# Patient Record
Sex: Female | Born: 1950 | Race: Black or African American | Hispanic: No | Marital: Married | Smoking: Never smoker
Health system: Southern US, Community
[De-identification: ages and names within clinical notes are randomized; demographics above are authoritative.]

## PROBLEM LIST (undated history)

## (undated) DIAGNOSIS — I5030 Unspecified diastolic (congestive) heart failure: Secondary | ICD-10-CM

## (undated) DIAGNOSIS — I509 Heart failure, unspecified: Secondary | ICD-10-CM

## (undated) DIAGNOSIS — Z951 Presence of aortocoronary bypass graft: Secondary | ICD-10-CM

## (undated) DIAGNOSIS — R7881 Bacteremia: Secondary | ICD-10-CM

## (undated) DIAGNOSIS — N186 End stage renal disease: Secondary | ICD-10-CM

## (undated) DIAGNOSIS — J329 Chronic sinusitis, unspecified: Secondary | ICD-10-CM

## (undated) DIAGNOSIS — I779 Disorder of arteries and arterioles, unspecified: Secondary | ICD-10-CM

## (undated) DIAGNOSIS — T4145XA Adverse effect of unspecified anesthetic, initial encounter: Secondary | ICD-10-CM

## (undated) DIAGNOSIS — R630 Anorexia: Secondary | ICD-10-CM

## (undated) DIAGNOSIS — I2510 Atherosclerotic heart disease of native coronary artery without angina pectoris: Secondary | ICD-10-CM

## (undated) DIAGNOSIS — R011 Cardiac murmur, unspecified: Secondary | ICD-10-CM

## (undated) DIAGNOSIS — I10 Essential (primary) hypertension: Secondary | ICD-10-CM

## (undated) DIAGNOSIS — E7800 Pure hypercholesterolemia, unspecified: Secondary | ICD-10-CM

## (undated) DIAGNOSIS — I739 Peripheral vascular disease, unspecified: Secondary | ICD-10-CM

## (undated) DIAGNOSIS — E118 Type 2 diabetes mellitus with unspecified complications: Secondary | ICD-10-CM

## (undated) DIAGNOSIS — K219 Gastro-esophageal reflux disease without esophagitis: Secondary | ICD-10-CM

## (undated) DIAGNOSIS — I219 Acute myocardial infarction, unspecified: Secondary | ICD-10-CM

## (undated) DIAGNOSIS — Z992 Dependence on renal dialysis: Secondary | ICD-10-CM

## (undated) DIAGNOSIS — D649 Anemia, unspecified: Secondary | ICD-10-CM

## (undated) DIAGNOSIS — B965 Pseudomonas (aeruginosa) (mallei) (pseudomallei) as the cause of diseases classified elsewhere: Secondary | ICD-10-CM

## (undated) HISTORY — PX: COLONOSCOPY: SHX174

## (undated) HISTORY — PX: CORONARY ANGIOPLASTY: SHX604

## (undated) HISTORY — PX: VASCULAR SURGERY: SHX849

## (undated) HISTORY — PX: ABDOMINAL HYSTERECTOMY: SHX81

## (undated) HISTORY — PX: EYE SURGERY: SHX253

## (undated) HISTORY — PX: INSERTION OF DIALYSIS CATHETER: SHX1324

---

## 2013-01-29 DIAGNOSIS — Z0000 Encounter for general adult medical examination without abnormal findings: Secondary | ICD-10-CM | POA: Insufficient documentation

## 2013-09-20 DIAGNOSIS — D649 Anemia, unspecified: Secondary | ICD-10-CM | POA: Insufficient documentation

## 2013-09-20 DIAGNOSIS — N186 End stage renal disease: Secondary | ICD-10-CM | POA: Insufficient documentation

## 2013-09-20 DIAGNOSIS — D631 Anemia in chronic kidney disease: Secondary | ICD-10-CM | POA: Insufficient documentation

## 2014-04-19 DIAGNOSIS — I5032 Chronic diastolic (congestive) heart failure: Secondary | ICD-10-CM | POA: Insufficient documentation

## 2014-04-19 DIAGNOSIS — I5043 Acute on chronic combined systolic (congestive) and diastolic (congestive) heart failure: Secondary | ICD-10-CM | POA: Insufficient documentation

## 2014-04-22 DIAGNOSIS — I219 Acute myocardial infarction, unspecified: Secondary | ICD-10-CM

## 2014-04-22 HISTORY — DX: Acute myocardial infarction, unspecified: I21.9

## 2015-01-21 DIAGNOSIS — I2510 Atherosclerotic heart disease of native coronary artery without angina pectoris: Secondary | ICD-10-CM

## 2015-01-21 HISTORY — DX: Atherosclerotic heart disease of native coronary artery without angina pectoris: I25.10

## 2015-01-21 HISTORY — PX: CARDIAC CATHETERIZATION: SHX172

## 2015-03-24 DIAGNOSIS — Z951 Presence of aortocoronary bypass graft: Secondary | ICD-10-CM

## 2015-03-24 HISTORY — DX: Presence of aortocoronary bypass graft: Z95.1

## 2015-03-28 HISTORY — PX: CORONARY ARTERY BYPASS GRAFT: SHX141

## 2015-04-23 HISTORY — PX: TRANSTHORACIC ECHOCARDIOGRAM: SHX275

## 2015-10-03 ENCOUNTER — Observation Stay
Admission: EM | Admit: 2015-10-03 | Discharge: 2015-10-04 | Disposition: A | Discharge status: Skilled Nursing Facility | Payer: Medicaid Other | Attending: Internal Medicine | Admitting: Internal Medicine

## 2015-10-03 ENCOUNTER — Emergency Department: Payer: Medicaid Other

## 2015-10-03 DIAGNOSIS — R109 Unspecified abdominal pain: Secondary | ICD-10-CM | POA: Diagnosis present

## 2015-10-03 DIAGNOSIS — I2510 Atherosclerotic heart disease of native coronary artery without angina pectoris: Secondary | ICD-10-CM | POA: Diagnosis not present

## 2015-10-03 DIAGNOSIS — Z992 Dependence on renal dialysis: Secondary | ICD-10-CM | POA: Insufficient documentation

## 2015-10-03 DIAGNOSIS — K5900 Constipation, unspecified: Secondary | ICD-10-CM | POA: Diagnosis not present

## 2015-10-03 DIAGNOSIS — N186 End stage renal disease: Secondary | ICD-10-CM

## 2015-10-03 DIAGNOSIS — Z794 Long term (current) use of insulin: Secondary | ICD-10-CM | POA: Insufficient documentation

## 2015-10-03 DIAGNOSIS — R778 Other specified abnormalities of plasma proteins: Secondary | ICD-10-CM | POA: Diagnosis present

## 2015-10-03 DIAGNOSIS — I252 Old myocardial infarction: Secondary | ICD-10-CM | POA: Insufficient documentation

## 2015-10-03 DIAGNOSIS — T814XXA Infection following a procedure, initial encounter: Secondary | ICD-10-CM | POA: Diagnosis not present

## 2015-10-03 DIAGNOSIS — I132 Hypertensive heart and chronic kidney disease with heart failure and with stage 5 chronic kidney disease, or end stage renal disease: Secondary | ICD-10-CM | POA: Diagnosis not present

## 2015-10-03 DIAGNOSIS — Z888 Allergy status to other drugs, medicaments and biological substances status: Secondary | ICD-10-CM | POA: Insufficient documentation

## 2015-10-03 DIAGNOSIS — B965 Pseudomonas (aeruginosa) (mallei) (pseudomallei) as the cause of diseases classified elsewhere: Secondary | ICD-10-CM | POA: Insufficient documentation

## 2015-10-03 DIAGNOSIS — E1122 Type 2 diabetes mellitus with diabetic chronic kidney disease: Secondary | ICD-10-CM | POA: Diagnosis not present

## 2015-10-03 DIAGNOSIS — Z90710 Acquired absence of both cervix and uterus: Secondary | ICD-10-CM | POA: Insufficient documentation

## 2015-10-03 DIAGNOSIS — R7989 Other specified abnormal findings of blood chemistry: Secondary | ICD-10-CM | POA: Diagnosis present

## 2015-10-03 DIAGNOSIS — Z79899 Other long term (current) drug therapy: Secondary | ICD-10-CM | POA: Insufficient documentation

## 2015-10-03 DIAGNOSIS — I5032 Chronic diastolic (congestive) heart failure: Secondary | ICD-10-CM | POA: Insufficient documentation

## 2015-10-03 DIAGNOSIS — Z7982 Long term (current) use of aspirin: Secondary | ICD-10-CM | POA: Insufficient documentation

## 2015-10-03 DIAGNOSIS — N200 Calculus of kidney: Secondary | ICD-10-CM | POA: Diagnosis not present

## 2015-10-03 DIAGNOSIS — Z951 Presence of aortocoronary bypass graft: Secondary | ICD-10-CM | POA: Diagnosis not present

## 2015-10-03 DIAGNOSIS — I214 Non-ST elevation (NSTEMI) myocardial infarction: Secondary | ICD-10-CM | POA: Diagnosis not present

## 2015-10-03 DIAGNOSIS — E118 Type 2 diabetes mellitus with unspecified complications: Secondary | ICD-10-CM | POA: Diagnosis present

## 2015-10-03 HISTORY — DX: Essential (primary) hypertension: I10

## 2015-10-03 HISTORY — DX: Atherosclerotic heart disease of native coronary artery without angina pectoris: I25.10

## 2015-10-03 HISTORY — DX: End stage renal disease: N18.6

## 2015-10-03 HISTORY — DX: Presence of aortocoronary bypass graft: Z95.1

## 2015-10-03 HISTORY — DX: Dependence on renal dialysis: Z99.2

## 2015-10-03 HISTORY — DX: Type 2 diabetes mellitus with unspecified complications: E11.8

## 2015-10-03 LAB — GLUCOSE, CAPILLARY: Glucose-Capillary: 145 mg/dL — ABNORMAL HIGH (ref 65–99)

## 2015-10-03 LAB — COMPREHENSIVE METABOLIC PANEL
ALT: 18 U/L (ref 14–54)
AST: 22 U/L (ref 15–41)
Albumin: 3.4 g/dL — ABNORMAL LOW (ref 3.5–5.0)
Alkaline Phosphatase: 95 U/L (ref 38–126)
Anion gap: 14 (ref 5–15)
BUN: 24 mg/dL — ABNORMAL HIGH (ref 6–20)
CO2: 25 mmol/L (ref 22–32)
Calcium: 9.4 mg/dL (ref 8.9–10.3)
Chloride: 93 mmol/L — ABNORMAL LOW (ref 101–111)
Creatinine, Ser: 3.5 mg/dL — ABNORMAL HIGH (ref 0.44–1.00)
GFR calc Af Amer: 15 mL/min — ABNORMAL LOW (ref >60)
GFR calc non Af Amer: 13 mL/min — ABNORMAL LOW (ref >60)
Glucose, Bld: 156 mg/dL — ABNORMAL HIGH (ref 65–99)
Potassium: 4 mmol/L (ref 3.5–5.1)
Sodium: 132 mmol/L — ABNORMAL LOW (ref 135–145)
Total Bilirubin: 0.5 mg/dL (ref 0.3–1.2)
Total Protein: 8.6 g/dL — ABNORMAL HIGH (ref 6.5–8.1)

## 2015-10-03 LAB — CBC WITH DIFFERENTIAL/PLATELET
Basophils Absolute: 0 10*3/uL (ref 0–0.1)
Basophils Relative: 0 %
Eosinophils Absolute: 0.2 10*3/uL (ref 0–0.7)
Eosinophils Relative: 2 %
HCT: 29.6 % — ABNORMAL LOW (ref 35.0–47.0)
Hemoglobin: 9.3 g/dL — ABNORMAL LOW (ref 12.0–16.0)
Lymphocytes Relative: 11 %
Lymphs Abs: 1.7 10*3/uL (ref 1.0–3.6)
MCH: 26.5 pg (ref 26.0–34.0)
MCHC: 31.2 g/dL — ABNORMAL LOW (ref 32.0–36.0)
MCV: 84.8 fL (ref 80.0–100.0)
Monocytes Absolute: 1.3 10*3/uL — ABNORMAL HIGH (ref 0.2–0.9)
Monocytes Relative: 9 %
Neutro Abs: 11.5 10*3/uL — ABNORMAL HIGH (ref 1.4–6.5)
Neutrophils Relative %: 78 %
Platelets: 283 10*3/uL (ref 150–440)
RBC: 3.5 MIL/uL — ABNORMAL LOW (ref 3.80–5.20)
RDW: 21.3 % — ABNORMAL HIGH (ref 11.5–14.5)
WBC: 14.8 10*3/uL — ABNORMAL HIGH (ref 3.6–11.0)

## 2015-10-03 LAB — LIPASE, BLOOD: Lipase: 60 U/L — ABNORMAL HIGH (ref 11–51)

## 2015-10-03 LAB — TROPONIN I: Troponin I: 0.07 ng/mL — ABNORMAL HIGH (ref <0.031)

## 2015-10-03 MED ORDER — SODIUM CHLORIDE 0.9% FLUSH
3.0000 mL | Freq: Two times a day (BID) | INTRAVENOUS | Status: DC
  Administered 2015-10-04: 3 mL via INTRAVENOUS

## 2015-10-03 MED ORDER — ASPIRIN 81 MG PO CHEW
324.0000 mg | CHEWABLE_TABLET | Freq: Once | ORAL | Status: AC
  Administered 2015-10-03: 324 mg via ORAL
  Filled 2015-10-03: qty 4

## 2015-10-03 MED ORDER — SODIUM CHLORIDE 0.9 % IV SOLN: INTRAVENOUS | Status: DC

## 2015-10-03 MED ORDER — POLYETHYLENE GLYCOL 3350 17 G PO PACK
17.0000 g | PACK | Freq: Every day | ORAL | Status: DC
  Administered 2015-10-04: 17 g via ORAL
  Filled 2015-10-03: qty 1

## 2015-10-03 MED ORDER — DIATRIZOATE MEGLUMINE & SODIUM 66-10 % PO SOLN
30.0000 mL | Freq: Once | ORAL | Status: AC
  Administered 2015-10-03: 30 mL via ORAL

## 2015-10-03 MED ORDER — ONDANSETRON HCL 4 MG PO TABS: 4.0000 mg | ORAL_TABLET | Freq: Four times a day (QID) | ORAL | Status: DC | PRN

## 2015-10-03 MED ORDER — ASPIRIN EC 325 MG PO TBEC
325.0000 mg | DELAYED_RELEASE_TABLET | Freq: Every day | ORAL | Status: DC
  Administered 2015-10-04: 325 mg via ORAL
  Filled 2015-10-03: qty 1

## 2015-10-03 MED ORDER — DOCUSATE SODIUM 100 MG PO CAPS
100.0000 mg | ORAL_CAPSULE | Freq: Two times a day (BID) | ORAL | Status: DC
  Administered 2015-10-03 – 2015-10-04 (×2): 100 mg via ORAL
  Filled 2015-10-03 (×2): qty 1

## 2015-10-03 MED ORDER — ACETAMINOPHEN 650 MG RE SUPP
650.0000 mg | Freq: Four times a day (QID) | RECTAL | Status: DC | PRN
Start: 2015-10-03 — End: 2015-10-04

## 2015-10-03 MED ORDER — ACETAMINOPHEN 325 MG PO TABS: 650.0000 mg | ORAL_TABLET | Freq: Four times a day (QID) | ORAL | Status: DC | PRN

## 2015-10-03 MED ORDER — ONDANSETRON HCL 4 MG/2ML IJ SOLN: 4.0000 mg | Freq: Four times a day (QID) | INTRAMUSCULAR | Status: DC | PRN

## 2015-10-03 MED ORDER — HEPARIN SODIUM (PORCINE) 5000 UNIT/ML IJ SOLN
5000.0000 [IU] | Freq: Three times a day (TID) | INTRAMUSCULAR | Status: DC
  Administered 2015-10-03 – 2015-10-04 (×2): 5000 [IU] via SUBCUTANEOUS
  Filled 2015-10-03 (×2): qty 1

## 2015-10-03 NOTE — ED Provider Notes (Signed)
[facility] Emergency Department Provider Note   ____________________________________________  Time seen: Approximately 4:31 PM  I have reviewed the triage vital signs and the nursing notes.   HISTORY  Chief Complaint Abdominal Pain    HPI Stephanie Ellis is a 65 y.o. female with history of coronary artery disease status post CABG at the end of 8127, complicated by PEA arrest and recurrent intubations, currently followed by [facility] infectious disease on IV Ceftazidime for  sternal/incision infection, chronic kidney disease on dialysis but slated to hopefully get off of dialysis in the very near future who presents for evaluation of mild right mid abdomen and right lower quadrant pain, sudden onset today while at dialysis, initially severe, no modifying factors. Patient reports that she was doubled over in pain at dialysis, she received Tylenol and that improved her symptoms. She only received 30 minutes of dialysis and was sent to the ER for further evaluation. She felt nauseated but she's had no vomiting, diarrhea, fevers or chills. She does still make urine 1-2 times per day. She denies any chest pain or difficulty breathing.   Past Medical History  Diagnosis Date  . Coronary artery disease   . Diabetes mellitus without complication ([place])   . Hypertension   . Renal disorder     There are no active problems to display for this patient.   Past Surgical History  Procedure Laterality Date  . Coronary artery bypass graft    . Abdominal hysterectomy      No current outpatient prescriptions on file.  Allergies Chlorthalidone and Norvasc  No family history on file.  Social History Social History  Substance Use Topics  . Smoking status: Never Smoker   . Smokeless tobacco: Never Used  . Alcohol Use: No    Review of Systems Constitutional: No fever/chills Eyes: No visual changes. ENT: No sore throat. Cardiovascular: Denies chest pain. Respiratory:  Denies shortness of breath. Gastrointestinal: + abdominal pain.  + nausea, no vomiting.  No diarrhea.  No constipation. Genitourinary: Negative for dysuria. Musculoskeletal: Negative for back pain. Skin: Negative for rash. Neurological: Negative for headaches, focal weakness or numbness.  10-point ROS otherwise negative.  ____________________________________________   PHYSICAL EXAM:  VITAL SIGNS: ED Triage Vitals  Enc Vitals Group     BP --      Pulse Rate 10/03/15 1609 80     Resp 10/03/15 1609 18     Temp 10/03/15 1609 97.7 F (36.5 C)     Temp Source 10/03/15 1609 Oral     SpO2 10/03/15 1609 99 %     Weight 10/03/15 1609 124 lb (56.246 kg)     Height 10/03/15 1609 5' 4"  (1.626 m)     Head Cir --      Peak Flow --      Pain Score 10/03/15 1609 0     Pain Loc --      Pain Edu? --      Excl. in [place]? --     Constitutional: Alert and oriented. Well appearing and in no acute distress. Eyes: Conjunctivae are normal. PERRL. EOMI. Head: Atraumatic. Nose: No congestion/rhinnorhea. Mouth/Throat: Mucous membranes are moist.  Oropharynx non-erythematous. Neck: No stridor.  Supple without meningismus. Cardiovascular: Normal rate, regular rhythm. Grossly normal heart sounds.  Good peripheral circulation. Respiratory: Normal respiratory effort.  No retractions. Lungs CTAB. Gastrointestinal: Soft with mild tender to palpation in the right mid abdomen and the right lower quadrant. No CVA tenderness. Genitourinary: deferred Musculoskeletal: No lower  extremity tenderness nor edema.  No joint effusions. Neurologic:  Normal speech and language. No gross focal neurologic deficits are appreciated.  Skin:  Skin is warm, dry and intact. No rash noted. Well-healed closed sternal incision. Psychiatric: Mood and affect are normal. Speech and behavior are normal.  ____________________________________________   LABS (all labs ordered are listed, but only abnormal results are displayed)  Labs  Reviewed  CBC WITH DIFFERENTIAL/PLATELET - Abnormal; Notable for the following:    WBC 14.8 (*)    RBC 3.50 (*)    Hemoglobin 9.3 (*)    HCT 29.6 (*)    MCHC 31.2 (*)    RDW 21.3 (*)    Neutro Abs 11.5 (*)    Monocytes Absolute 1.3 (*)    All other components within normal limits  COMPREHENSIVE METABOLIC PANEL - Abnormal; Notable for the following:    Sodium 132 (*)    Chloride 93 (*)    Glucose, Bld 156 (*)    BUN 24 (*)    Creatinine, Ser 3.50 (*)    Total Protein 8.6 (*)    Albumin 3.4 (*)    GFR calc non Af Amer 13 (*)    GFR calc Af Amer 15 (*)    All other components within normal limits  LIPASE, BLOOD - Abnormal; Notable for the following:    Lipase 60 (*)    All other components within normal limits  TROPONIN I - Abnormal; Notable for the following:    Troponin I 0.07 (*)    All other components within normal limits  URINALYSIS COMPLETEWITH MICROSCOPIC ([facility] ONLY)   ____________________________________________  EKG  ED ECG REPORT I, Joanne Gavel, the attending physician, personally viewed and interpreted this ECG.   Date: 10/03/2015  EKG Time: 16:11  Rate: 81  Rhythm: normal sinus rhythm  Axis: left  Intervals:none  ST&T Change: No acute ST elevation. Minimal ST depression in lead 2, aVF, V5, V6.  ____________________________________________  RADIOLOGY  CT abdomen and pelvis IMPRESSION: 1. No acute findings within the abdomen or pelvis. 2. There is moderate increased stool in the colon rectum with mild colonic dilation most evident above the cecum which measures 7.5 cm. No bowel inflammatory changes and no evidence of obstruction. 3. Several intrarenal stones in each kidney. No ureteral stone or obstructive uropathy. ____________________________________________   PROCEDURES  Procedure(s) performed: None  Critical Care performed: No  ____________________________________________   INITIAL IMPRESSION / ASSESSMENT AND PLAN / ED  COURSE  Pertinent labs & imaging results that were available during my care of the patient were reviewed by me and considered in my medical decision making (see chart for details).  Stephanie Ellis is a 65 y.o. female with history of coronary artery disease status post CABG at the end of 7425, complicated by PEA arrest and recurrent intubations, currently followed by [facility] infectious disease on IV Ceftazidime for  sternal/incision infection, chronic kidney disease on dialysis but slated to hopefully get off of dialysis in the very near future who presents for evaluation of right-sided abdominal pain today which Is severe but is now mild. On exam, she has mild tenderness to palpation throughout the right mid abdomen in the right lower quadrant. Her vital signs are stable she is afebrile. EKG shows lateral ST depression in several leads, no prior available for comparison. We'll obtain screening labs, CT of the abdomen and pelvis, reassess for disposition.  ----------------------------------------- 8:35 PM on 10/03/2015 ----------------------------------------- CT scan shows increased stool with colonic dilatation above the  cecum measuring 7.5 cm, no inflammatory changes or obstruction, I suspect the most likely cause of her pain however her troponin is also mildly elevated at 0.07. Creatinine is 3.5 today. It is unclear to me whether not this represents NSTEMI or whether this is related to her chronic kidney disease. There are no prior values available for comparison here. On care everywhere, I reviewed her labs and she has not had a troponin elevation in the setting of a similarly elevated creatinine. Given her extensive cardiac history, minimal ST depressions on EKG, troponin elevation and concern for possible atypical ACS presentation, I discussed the case with the hospitalist, Dr. Marcille Blanco for admission at this time. Aspirin ordered.  ____________________________________________   FINAL CLINICAL  IMPRESSION(S) / ED DIAGNOSES  Final diagnoses:  Right sided abdominal pain  NSTEMI (non-ST elevated myocardial infarction) ([place])      NEW MEDICATIONS STARTED DURING THIS VISIT:  New Prescriptions   No medications on file     Note:  This document was prepared using Dragon voice recognition software and may include unintentional dictation errors.    Joanne Gavel, MD 10/03/15 2037

## 2015-10-03 NOTE — H&P (Signed)
Stephanie Ellis is an 65 y.o. female.   Chief Complaint: Abdominal pain HPI: This is a patient with past medical history significant for end-stage renal disease on dialysis and coronary artery disease status post CABG who presents to the emergency department complaining of right-sided abdominal pain. Patient states the pain began while she was on dialysis today. She denies chest pain or shortness of breath at any time today. In the emergency department imaging of her abdomen revealed a large amount of stool area did indeed the patient admits that she has been constipated. Routine laboratory evaluation revealed an elevated troponin. Emergency department physician were reviewed count was from previous hospitalizations that showed normal troponins despite recent dialysis. Also the patient has some nonspecific ST changes compared to previous EKGs. Due to her elevated troponin and EKG changes emergency department staff the hospitalist service for further evaluation.  Past Medical History  Diagnosis Date  . Coronary artery disease   . Diabetes mellitus without complication ([place])   . Hypertension   . Renal disorder     ESRD secondary to acute kidney failure s/p CABG    Past Surgical History  Procedure Laterality Date  . Coronary artery bypass graft  03/28/15  . Abdominal hysterectomy    . Insertion of dialysis catheter      Family History  Problem Relation Age of Onset  . Diabetes Mellitus II Mother    Social History:  reports that she has never smoked. She has never used smokeless tobacco. She reports that she does not drink alcohol or use illicit drugs.  Allergies:  Allergies  Allergen Reactions  . Chlorthalidone Anaphylaxis    UNKNOWN  . Norvasc [Amlodipine] Itching     (Not in a hospital admission)  Results for orders placed or performed during the hospital encounter of 10/03/15 (from the past 48 hour(s))  CBC with Differential     Status: Abnormal   Collection Time: 10/03/15  5:01  PM  Result Value Ref Range   WBC 14.8 (H) 3.6 - 11.0 K/uL   RBC 3.50 (L) 3.80 - 5.20 MIL/uL   Hemoglobin 9.3 (L) 12.0 - 16.0 g/dL   HCT 29.6 (L) 35.0 - 47.0 %   MCV 84.8 80.0 - 100.0 fL   MCH 26.5 26.0 - 34.0 pg   MCHC 31.2 (L) 32.0 - 36.0 g/dL   RDW 21.3 (H) 11.5 - 14.5 %   Platelets 283 150 - 440 K/uL   Neutrophils Relative % 78% %   Neutro Abs 11.5 (H) 1.4 - 6.5 K/uL   Lymphocytes Relative 11% %   Lymphs Abs 1.7 1.0 - 3.6 K/uL   Monocytes Relative 9% %   Monocytes Absolute 1.3 (H) 0.2 - 0.9 K/uL   Eosinophils Relative 2% %   Eosinophils Absolute 0.2 0 - 0.7 K/uL   Basophils Relative 0% %   Basophils Absolute 0.0 0 - 0.1 K/uL  Comprehensive metabolic panel     Status: Abnormal   Collection Time: 10/03/15  5:01 PM  Result Value Ref Range   Sodium 132 (L) 135 - 145 mmol/L   Potassium 4.0 3.5 - 5.1 mmol/L   Chloride 93 (L) 101 - 111 mmol/L   CO2 25 22 - 32 mmol/L   Glucose, Bld 156 (H) 65 - 99 mg/dL   BUN 24 (H) 6 - 20 mg/dL   Creatinine, Ser 3.50 (H) 0.44 - 1.00 mg/dL   Calcium 9.4 8.9 - 10.3 mg/dL   Total Protein 8.6 (H) 6.5 - 8.1 g/dL  Albumin 3.4 (L) 3.5 - 5.0 g/dL   AST 22 15 - 41 U/L   ALT 18 14 - 54 U/L   Alkaline Phosphatase 95 38 - 126 U/L   Total Bilirubin 0.5 0.3 - 1.2 mg/dL   GFR calc non Af Amer 13 (L) >60 mL/min   GFR calc Af Amer 15 (L) >60 mL/min    Comment: (NOTE) The eGFR has been calculated using the CKD EPI equation. This calculation has not been validated in all clinical situations. eGFR's persistently <60 mL/min signify possible Chronic Kidney Disease.    Anion gap 14 5 - 15  Lipase, blood     Status: Abnormal   Collection Time: 10/03/15  5:01 PM  Result Value Ref Range   Lipase 60 (H) 11 - 51 U/L  Troponin I     Status: Abnormal   Collection Time: 10/03/15  5:01 PM  Result Value Ref Range   Troponin I 0.07 (H) <0.031 ng/mL    Comment: READ BACK AND VERIFIED WITH AMY COYNE AT 1808 10/03/15 MLZ        PERSISTENTLY INCREASED TROPONIN VALUES  IN THE RANGE OF 0.04-0.49 ng/mL CAN BE SEEN IN:       -UNSTABLE ANGINA       -CONGESTIVE HEART FAILURE       -MYOCARDITIS       -CHEST TRAUMA       -ARRYHTHMIAS       -LATE PRESENTING MYOCARDIAL INFARCTION       -COPD   CLINICAL FOLLOW-UP RECOMMENDED.    Ct Abdomen Pelvis Wo Contrast  10/03/2015  CLINICAL DATA:  Sudden onset of right lower quadrant pain approximately 2 hours ago at dialysis. Pain gone now. EXAM: CT ABDOMEN AND PELVIS WITHOUT CONTRAST TECHNIQUE: Multidetector CT imaging of the abdomen and pelvis was performed following the standard protocol without IV contrast. COMPARISON:  None. FINDINGS: Lung bases: Linear and reticular opacities are noted consistent with a combination of scarring and subsegmental atelectasis. Mild dependent pleural thickening. Heart is mildly enlarged. Hepatobiliary: Unremarkable. Spleen and pancreas:  Unremarkable. Adrenal glands:  No masses. Kidneys, ureters, bladder: There are bilateral intrarenal stones. Many of these are likely vascular. Several collecting system stones are suggested. No renal masses. Slight prominence of the right renal pelvis and proximal ureter. Remaining portions of the ureter decompressed. No ureteral stone is seen. Bladder is mildly distended. Wall appears mildly thickened. No bladder mass or stone. Uterus and adnexa:  Uterus surgically absent.  No pelvic masses. Lymph nodes:  No adenopathy. Ascites:  None. Vascular: Dense atherosclerotic calcifications are noted along the aorta as well as the branch vessels. Gastrointestinal: Colon is moderately distended with stool as is the rectum. Right colon is dilated to a maximum of 7.5 cm at the cecum. There is no colonic wall thickening or adjacent inflammation. Several diverticula noted along the left colon without inflammatory changes. Appendix not seen. No evidence of appendicitis. Stomach and small bowel are unremarkable. Musculoskeletal:  No osteoblastic or osteolytic lesions. IMPRESSION: 1.  No acute findings within the abdomen or pelvis. 2. There is moderate increased stool in the colon rectum with mild colonic dilation most evident above the cecum which measures 7.5 cm. No bowel inflammatory changes and no evidence of obstruction. 3. Several intrarenal stones in each kidney. No ureteral stone or obstructive uropathy. Electronically Signed   By: Lajean Manes M.D.   On: 10/03/2015 19:56    Review of Systems  Constitutional: Negative for fever and chills.  HENT: Negative  for sore throat and tinnitus.   Eyes: Negative for blurred vision and redness.  Respiratory: Negative for cough and shortness of breath.   Cardiovascular: Negative for chest pain, palpitations, orthopnea and PND.  Gastrointestinal: Positive for abdominal pain (now resolved) and constipation. Negative for nausea, vomiting and diarrhea.  Genitourinary: Negative for dysuria, urgency and frequency.  Musculoskeletal: Negative for myalgias and joint pain.  Skin: Negative for rash.       No lesions  Neurological: Negative for speech change, focal weakness and weakness.  Endo/Heme/Allergies: Does not bruise/bleed easily.       No temperature intolerance  Psychiatric/Behavioral: Negative for depression and suicidal ideas.    Blood pressure 165/67, pulse 80, temperature 97.7 F (36.5 C), temperature source Oral, resp. rate 18, height 5' 4"  (1.626 m), weight 56.246 kg (124 lb), SpO2 99 %. Physical Exam  Vitals reviewed. Constitutional: She is oriented to person, place, and time. She appears well-developed and well-nourished. No distress.  HENT:  Head: Normocephalic and atraumatic.  Mouth/Throat: Oropharynx is clear and moist.  Eyes: Conjunctivae and EOM are normal. Pupils are equal, round, and reactive to light. No scleral icterus.  Neck: Normal range of motion. Neck supple. No JVD present. No tracheal deviation present. No thyromegaly present.  Cardiovascular: Normal rate, regular rhythm and normal heart sounds.   Exam reveals no gallop and no friction rub.   No murmur heard. Respiratory: Effort normal and breath sounds normal.  Dialysis catheter in place right upper chest; clean and dressed  GI: Soft. Bowel sounds are normal. She exhibits no distension. There is no tenderness.  Genitourinary:  Deferred  Lymphadenopathy:    She has no cervical adenopathy.  Neurological: She is alert and oriented to person, place, and time. No cranial nerve deficit. She exhibits normal muscle tone.  Skin: Skin is warm and dry. No rash noted. No erythema.  Psychiatric: She has a normal mood and affect. Her behavior is normal. Judgment and thought content normal.     Assessment/Plan This is a 65 year old female admitted for elevated troponins.  1. Elevated troponin: Likely secondary to relative supply ischemia while on dialysis and/or straining from constipation and concomitant decreased renal clearance. We will follow the patient's cardiac enzymes and place her on telemetry. I have ordered a cardiology consult. Continue aspirin 2. Diabetes mellitus type II: Continue basal insulin and sliding scale. (No mixed insulin). 3. Essential hypertension: Continue clonidine, hydralazine, and metoprolol 4. End-stage renal disease: Dialysis Tuesday, Thursday, Saturday 5. DVT prophylaxis: Heparin 6. GI prophylaxis: None The patient is a full code. Time spent on admission orders and patient care approximately 45 minutes  Harrie Foreman, MD 10/03/2015, 9:03 PM

## 2015-10-03 NOTE — ED Notes (Signed)
Pt finished with contrast. CT called.

## 2015-10-03 NOTE — ED Notes (Signed)
Patient with sudden onset of RLQ abdominal pain about 2 hours ago at dialysis. States it is gone now. Was given 2 tylenol at dialysis.

## 2015-10-04 ENCOUNTER — Encounter: Payer: Self-pay | Admitting: Cardiology

## 2015-10-04 DIAGNOSIS — N186 End stage renal disease: Secondary | ICD-10-CM

## 2015-10-04 DIAGNOSIS — R7989 Other specified abnormal findings of blood chemistry: Secondary | ICD-10-CM

## 2015-10-04 DIAGNOSIS — E118 Type 2 diabetes mellitus with unspecified complications: Secondary | ICD-10-CM

## 2015-10-04 DIAGNOSIS — I2510 Atherosclerotic heart disease of native coronary artery without angina pectoris: Secondary | ICD-10-CM | POA: Diagnosis not present

## 2015-10-04 DIAGNOSIS — I214 Non-ST elevation (NSTEMI) myocardial infarction: Secondary | ICD-10-CM | POA: Insufficient documentation

## 2015-10-04 DIAGNOSIS — R109 Unspecified abdominal pain: Secondary | ICD-10-CM | POA: Diagnosis not present

## 2015-10-04 DIAGNOSIS — Z951 Presence of aortocoronary bypass graft: Secondary | ICD-10-CM

## 2015-10-04 DIAGNOSIS — Z992 Dependence on renal dialysis: Secondary | ICD-10-CM

## 2015-10-04 DIAGNOSIS — Z794 Long term (current) use of insulin: Secondary | ICD-10-CM

## 2015-10-04 LAB — TROPONIN I
Troponin I: <0.03 ng/mL (ref <0.031)
Troponin I: 0.03 ng/mL (ref <0.031)

## 2015-10-04 LAB — HEMOGLOBIN A1C: Hgb A1c MFr Bld: 7 % — ABNORMAL HIGH (ref 4.0–6.0)

## 2015-10-04 LAB — GLUCOSE, CAPILLARY
Glucose-Capillary: 102 mg/dL — ABNORMAL HIGH (ref 65–99)
Glucose-Capillary: 140 mg/dL — ABNORMAL HIGH (ref 65–99)

## 2015-10-04 LAB — TSH: TSH: 4.262 u[IU]/mL (ref 0.350–4.500)

## 2015-10-04 LAB — MRSA PCR SCREENING: MRSA by PCR: NEGATIVE

## 2015-10-04 MED ORDER — MEGESTROL ACETATE 40 MG/ML PO SUSP
400.0000 mg | Freq: Every day | ORAL | Status: DC
  Administered 2015-10-04: 400 mg via ORAL
  Filled 2015-10-04: qty 10

## 2015-10-04 MED ORDER — LEVOFLOXACIN 500 MG PO TABS
500.0000 mg | ORAL_TABLET | ORAL | Status: DC
  Administered 2015-10-04: 500 mg via ORAL
  Filled 2015-10-04: qty 1

## 2015-10-04 MED ORDER — CLONIDINE HCL 0.1 MG/24HR TD PTWK
0.1000 mg | MEDICATED_PATCH | TRANSDERMAL | Status: DC
  Filled 2015-10-04: qty 1

## 2015-10-04 MED ORDER — CALCIUM ACETATE (PHOS BINDER) 667 MG PO CAPS
2001.0000 mg | ORAL_CAPSULE | Freq: Three times a day (TID) | ORAL | Status: DC
  Administered 2015-10-04 (×2): 2001 mg via ORAL
  Filled 2015-10-04 (×2): qty 3

## 2015-10-04 MED ORDER — HYDRALAZINE HCL 50 MG PO TABS
50.0000 mg | ORAL_TABLET | Freq: Three times a day (TID) | ORAL | Status: DC
  Administered 2015-10-04: 50 mg via ORAL
  Filled 2015-10-04: qty 1

## 2015-10-04 MED ORDER — GABAPENTIN 100 MG PO CAPS
100.0000 mg | ORAL_CAPSULE | Freq: Every day | ORAL | Status: DC
  Administered 2015-10-04: 100 mg via ORAL
  Filled 2015-10-04: qty 1

## 2015-10-04 MED ORDER — INSULIN GLARGINE 100 UNITS/ML SOLOSTAR PEN: 14.0000 [IU] | PEN_INJECTOR | Freq: Every day | SUBCUTANEOUS | Status: DC

## 2015-10-04 MED ORDER — FERROUS SULFATE 325 (65 FE) MG PO TABS
325.0000 mg | ORAL_TABLET | Freq: Every day | ORAL | Status: DC
  Administered 2015-10-04: 325 mg via ORAL
  Filled 2015-10-04: qty 1

## 2015-10-04 MED ORDER — ATORVASTATIN CALCIUM 20 MG PO TABS
40.0000 mg | ORAL_TABLET | Freq: Every day | ORAL | Status: DC
  Administered 2015-10-04: 40 mg via ORAL
  Filled 2015-10-04: qty 2

## 2015-10-04 MED ORDER — SALINE SPRAY 0.65 % NA SOLN: 1.0000 | NASAL | Status: DC | PRN

## 2015-10-04 MED ORDER — POLYVINYL ALCOHOL 1.4 % OP SOLN
2.0000 [drp] | OPHTHALMIC | Status: DC | PRN
  Filled 2015-10-04: qty 15

## 2015-10-04 MED ORDER — ADULT MULTIVITAMIN W/MINERALS CH
1.0000 | ORAL_TABLET | Freq: Every day | ORAL | Status: DC
  Administered 2015-10-04: 1 via ORAL
  Filled 2015-10-04: qty 1

## 2015-10-04 MED ORDER — INSULIN GLARGINE 100 UNIT/ML ~~LOC~~ SOLN
14.0000 [IU] | Freq: Every day | SUBCUTANEOUS | Status: DC
  Administered 2015-10-04: 14 [IU] via SUBCUTANEOUS
  Filled 2015-10-04: qty 0.14

## 2015-10-04 MED ORDER — FAMOTIDINE 20 MG PO TABS
20.0000 mg | ORAL_TABLET | Freq: Every day | ORAL | Status: DC
  Administered 2015-10-04: 20 mg via ORAL
  Filled 2015-10-04: qty 1

## 2015-10-04 MED ORDER — BISACODYL 10 MG RE SUPP: 10.0000 mg | RECTAL | Status: DC | PRN

## 2015-10-04 MED ORDER — VITAMIN D 1000 UNITS PO TABS
2000.0000 [IU] | ORAL_TABLET | Freq: Every day | ORAL | Status: DC
  Administered 2015-10-04: 2000 [IU] via ORAL
  Filled 2015-10-04: qty 2

## 2015-10-04 MED ORDER — METOPROLOL TARTRATE 25 MG PO TABS
25.0000 mg | ORAL_TABLET | Freq: Two times a day (BID) | ORAL | Status: DC
  Administered 2015-10-04: 25 mg via ORAL
  Filled 2015-10-04: qty 1

## 2015-10-04 NOTE — Consult Note (Signed)
CARDIOLOGY CONSULTATION NOTE.  NAME:  Stephanie Ellis   MRN: [id number] DOB:  October 12, 1950   ADMIT DATE: 10/03/2015  Reason for Consult: Mildly elevated troponin in a patient without any cardiac symptoms  Requesting Physician: Dr. Marcille Blanco  Primary Cardiologist: [facility] cardiology  HPI: This is a 65 y.o. female with a past medical history significant for recent diagnosis of coronary disease with left main coronary artery disease status post CABG in December 2016 (post CABG episodes complicated by a sternal wound infection), type 2 diabetes mellitus, end-stage renal disease on dialysis who presented to [facility] emergency room with right lower quadrant cramping abdominal pain noted during hemodialysis yesterday. For some reason troponin level was ordered and initial reading was 0.07, the next 2 were 0.03 indicating negative numbers. The patient denies any symptoms whatsoever of chest tightness/pressure or dyspnea with rest or exertion. Her anginal equivalent was exertional dyspnea which she has not had. PND, orthopnea or edema. No rapid irregular heartbeats or palpitations. No 60/near syncope or TIA/amaurosis fugax symptoms. She has not had any of her anginal equivalent type symptoms since her CABG.  I reviewed care everywhere to obtain her cardiac history including cath reports and CABG report.   PMHx:  CARDIAC HISTORY: cardiac catheterization, echocardiogram and & CABG Echo: Most recent 05/12/2015: EF 60-65%. GR 2 DD. Mild degenerative mitral valve disease but no prolapse or regurgitation. Mild left atrial dilation. Mild to moderate LVH. Pericardial effusion gone  Previous echo 04/11/2015: EF 55-60%, GR 2 DD. Mildly reduced RV systolic function with elevated pressures. Small moderate pericardial effusion Cath:  October 2016left main disease and multivessel disease - distal left main 70%. Proximal to mid LAD 50-60% but FFR positive, mid RCA 80-90% CABG: 03/24/2015 CABG 3: LIMA-LAD, SVG-RCA, SVG-OM  3 Past Medical History  Diagnosis Date  . Coronary artery disease involving left main coronary artery 01/2015    [facility]: 70% LM, p-mLAD 50-60% (Resting FFR 0.75), mRCA 80-90%, ~40 Ost OM & D1  . S/P CABG x 3 03/24/2015     [facility]: Dr. Marland Kitchen Haithcock: CABG x 3, LIMA to LAD, SVG to RCA, SVG to OM3, EVH  . ESRD (end stage renal disease) on dialysis [facility])     ESRD secondary to acute kidney failure s/p CABG  . Type II diabetes mellitus with complication (HCC)     CAD  . Essential hypertension    Past Surgical History  Procedure Laterality Date  . Coronary artery bypass graft  03/28/15     [facility]: Dr. Waldemar Dickens: LIMA to LAD, SVG to RCA, SVG to OM3, EVH  . Abdominal hysterectomy    . Insertion of dialysis catheter    . Cardiac catheterization  01/2015    [facility]: Ost LM 70%, p-m LAD 50-60% (Rest FFR + @ 0.75), mRCA 80-90%, ostD1 40%, pOM1 40%    FAMHx: Family History  Problem Relation Age of Onset  . Diabetes Mellitus II Mother     SOCHx:  reports that she has never smoked. She has never used smokeless tobacco. She reports that she does not drink alcohol or use illicit drugs.  ALLERGIES: Allergies  Allergen Reactions  . Chlorthalidone Anaphylaxis, Itching and Rash  . Ace Inhibitors Other (See Comments)    Reaction:  Hyperkalemia   . Angiotensin Receptor Blockers Other (See Comments)    Reaction:  Hyperkalemia   . Norvasc [Amlodipine] Itching and Rash    ROS: A comprehensive review of systems was performed Review of Systems  Constitutional: Positive for malaise/fatigue (Related to dialysis).  HENT: Negative for nosebleeds.   Eyes: Negative for blurred vision and double vision.  Respiratory: Negative for cough, sputum production and wheezing.   Cardiovascular: Negative.  Negative for claudication.       Per history of present illness  Gastrointestinal: Positive for nausea and abdominal pain (Right lower quadrant cramping pain). Negative for blood in stool and melena.   Genitourinary: Negative for hematuria.  Neurological: Negative for dizziness, focal weakness, seizures, loss of consciousness and headaches.  Endo/Heme/Allergies: Does not bruise/bleed easily.  Psychiatric/Behavioral: Negative for depression and memory loss. The patient is not nervous/anxious and does not have insomnia.   All other systems reviewed and are negative.   HOME MEDICATIONS: Prescriptions prior to admission  Medication Sig Dispense Refill Last Dose  . aspirin 81 MG chewable tablet Chew 81 mg by mouth daily.   unknown at unknown   . atorvastatin (LIPITOR) 40 MG tablet Take 40 mg by mouth daily.   unknown at unknown   . bisacodyl (DULCOLAX) 10 MG suppository Place 10 mg rectally as needed for moderate constipation.   PRN at PRN  . calcium acetate (PHOSLO) 667 MG capsule Take 2,001 mg by mouth 3 (three) times daily with meals.   unknown at unknown   . cholecalciferol (VITAMIN D) 1000 units tablet Take 2,000 Units by mouth daily.   unknown at unknown   . cloNIDine (CATAPRES - DOSED IN MG/24 HR) 0.1 mg/24hr patch Place 0.1 mg onto the skin once a week.   unknown at unknown  . docusate sodium (COLACE) 100 MG capsule Take 100 mg by mouth 2 (two) times daily.   unknown at unknown   . famotidine (PEPCID) 20 MG tablet Take 20 mg by mouth daily.   unknown at unknown   . ferrous sulfate 325 (65 FE) MG tablet Take 325 mg by mouth daily with breakfast.   unknown at unknown   . gabapentin (NEURONTIN) 100 MG capsule Take 100 mg by mouth daily.   unknown at unknown   . hydrALAZINE (APRESOLINE) 50 MG tablet Take 50 mg by mouth 3 (three) times daily.   unknown at unknown   . insulin aspart protamine- aspart (NOVOLOG MIX 70/30) (70-30) 100 UNIT/ML injection Inject 30 Units into the skin daily.   unknown at unknown   . insulin glargine (LANTUS) 100 unit/mL SOPN Inject 14 Units into the skin daily.   unknown at unknown   . levofloxacin (LEVAQUIN) 250 MG tablet Take 500 mg by mouth every other day.    unknown at unknown   . megestrol (MEGACE) 40 MG/ML suspension Take 400 mg by mouth daily.   unknown at unknown   . metoprolol tartrate (LOPRESSOR) 25 MG tablet Take 25 mg by mouth 2 (two) times daily.   unknown at unknown   . Multiple Vitamin (MULTIVITAMIN WITH MINERALS) TABS tablet Take 1 tablet by mouth daily.   unknown at unknown   . polyethylene glycol (MIRALAX / GLYCOLAX) packet Take 17 g by mouth daily as needed for mild constipation.   PRN at PRN  . polyvinyl alcohol (LIQUIFILM TEARS) 1.4 % ophthalmic solution Place 2 drops into both eyes every 4 (four) hours as needed for dry eyes.   PRN at PRN  . sodium chloride (OCEAN) 0.65 % SOLN nasal spray Place 1 spray into both nostrils every 4 (four) hours as needed for congestion.   PRN at PRN    HOSPITAL MEDICATIONS: Scheduled Meds: . aspirin EC  325 mg Oral Daily  . atorvastatin  40  mg Oral Daily  . calcium acetate  2,001 mg Oral TID WC  . cholecalciferol  2,000 Units Oral Daily  . cloNIDine  0.1 mg Transdermal Weekly  . docusate sodium  100 mg Oral BID  . famotidine  20 mg Oral Daily  . ferrous sulfate  325 mg Oral Q breakfast  . gabapentin  100 mg Oral Daily  . heparin  5,000 Units Subcutaneous Q8H  . hydrALAZINE  50 mg Oral TID  . insulin glargine  14 Units Subcutaneous Daily  . levofloxacin  500 mg Oral QODAY  . megestrol  400 mg Oral Daily  . metoprolol tartrate  25 mg Oral BID  . multivitamin with minerals  1 tablet Oral Daily  . polyethylene glycol  17 g Oral Daily  . sodium chloride flush  3 mL Intravenous Q12H   Continuous Infusions: . sodium chloride     PRN Meds:.acetaminophen **OR** acetaminophen, bisacodyl, ondansetron **OR** ondansetron (ZOFRAN) IV, polyvinyl alcohol, sodium chloride  VITALS: Blood pressure 133/57, pulse 79, temperature 98.6 F (37 C), temperature source Oral, resp. rate 17, height 5' 4"  (1.626 m), weight 127 lb 9.6 oz (57.879 kg), SpO2 98 %.  PHYSICAL EXAM: General appearance: alert,  cooperative, appears stated age, no distress and Well-nourished well-groomed HEENT: [place]/AT, EOMI, MMM, anicteric sclera; Cranial nerves: normal Neck: no adenopathy, no JVD, supple, symmetrical, trachea midline, thyroid not enlarged, symmetric, no tenderness/mass/nodules and Soft bruit heard related to fistula Lungs: clear to auscultation bilaterally, normal percussion bilaterally and Nonlabored, good air movement Heart: RRR with normal S1 and S2. No M/R/G. Nondisplaced PMI. Abdomen: Soft with mild right lower quadrant tenderness but no rebound. Normoactive bowel sounds. Extremities: extremities normal, atraumatic, no cyanosis or edema Pulses: 2+ and symmetric Skin: Skin color, texture, turgor normal. No rashes or lesions Neurologic: Alert and oriented X 3, normal strength and tone. Normal symmetric reflexes. Normal coordination and gait   LABS: Results for orders placed or performed during the hospital encounter of 10/03/15 (from the past 24 hour(s))  CBC with Differential     Status: Abnormal   Collection Time: 10/03/15  5:01 PM  Result Value Ref Range   WBC 14.8 (H) 3.6 - 11.0 K/uL   RBC 3.50 (L) 3.80 - 5.20 MIL/uL   Hemoglobin 9.3 (L) 12.0 - 16.0 g/dL   HCT 29.6 (L) 35.0 - 47.0 %   MCV 84.8 80.0 - 100.0 fL   MCH 26.5 26.0 - 34.0 pg   MCHC 31.2 (L) 32.0 - 36.0 g/dL   RDW 21.3 (H) 11.5 - 14.5 %   Platelets 283 150 - 440 K/uL   Neutrophils Relative % 78% %   Neutro Abs 11.5 (H) 1.4 - 6.5 K/uL   Lymphocytes Relative 11% %   Lymphs Abs 1.7 1.0 - 3.6 K/uL   Monocytes Relative 9% %   Monocytes Absolute 1.3 (H) 0.2 - 0.9 K/uL   Eosinophils Relative 2% %   Eosinophils Absolute 0.2 0 - 0.7 K/uL   Basophils Relative 0% %   Basophils Absolute 0.0 0 - 0.1 K/uL  Comprehensive metabolic panel     Status: Abnormal   Collection Time: 10/03/15  5:01 PM  Result Value Ref Range   Sodium 132 (L) 135 - 145 mmol/L   Potassium 4.0 3.5 - 5.1 mmol/L   Chloride 93 (L) 101 - 111 mmol/L   CO2 25 22 -  32 mmol/L   Glucose, Bld 156 (H) 65 - 99 mg/dL   BUN 24 (H) 6 - 20  mg/dL   Creatinine, Ser 3.50 (H) 0.44 - 1.00 mg/dL   Calcium 9.4 8.9 - 10.3 mg/dL   Total Protein 8.6 (H) 6.5 - 8.1 g/dL   Albumin 3.4 (L) 3.5 - 5.0 g/dL   AST 22 15 - 41 U/L   ALT 18 14 - 54 U/L   Alkaline Phosphatase 95 38 - 126 U/L   Total Bilirubin 0.5 0.3 - 1.2 mg/dL   GFR calc non Af Amer 13 (L) >60 mL/min   GFR calc Af Amer 15 (L) >60 mL/min   Anion gap 14 5 - 15  Lipase, blood     Status: Abnormal   Collection Time: 10/03/15  5:01 PM  Result Value Ref Range   Lipase 60 (H) 11 - 51 U/L  Troponin I     Status: Abnormal   Collection Time: 10/03/15  5:01 PM  Result Value Ref Range   Troponin I 0.07 (H) <0.031 ng/mL  Glucose, capillary     Status: Abnormal   Collection Time: 10/03/15 10:50 PM  Result Value Ref Range   Glucose-Capillary 145 (H) 65 - 99 mg/dL   Comment 1 Notify RN   TSH     Status: None   Collection Time: 10/03/15 10:56 PM  Result Value Ref Range   TSH 4.262 0.350 - 4.500 uIU/mL  Troponin I     Status: None   Collection Time: 10/03/15 10:56 PM  Result Value Ref Range   Troponin I <0.03 <0.031 ng/mL  Hemoglobin A1c     Status: Abnormal   Collection Time: 10/03/15 10:56 PM  Result Value Ref Range   Hgb A1c MFr Bld 7.0 (H) 4.0 - 6.0 %  MRSA PCR Screening     Status: None   Collection Time: 10/03/15 11:19 PM  Result Value Ref Range   MRSA by PCR NEGATIVE NEGATIVE  Troponin I     Status: None   Collection Time: 10/04/15  4:26 AM  Result Value Ref Range   Troponin I 0.03 <0.031 ng/mL  Glucose, capillary     Status: Abnormal   Collection Time: 10/04/15  7:43 AM  Result Value Ref Range   Glucose-Capillary 102 (H) 65 - 99 mg/dL  Glucose, capillary     Status: Abnormal   Collection Time: 10/04/15 11:41 AM  Result Value Ref Range   Glucose-Capillary 140 (H) 65 - 99 mg/dL    IMAGING: Ct Abdomen Pelvis Wo Contrast  10/03/2015  CLINICAL DATA:  Sudden onset of right lower quadrant pain  approximately 2 hours ago at dialysis. Pain gone now. EXAM: CT ABDOMEN AND PELVIS WITHOUT CONTRAST TECHNIQUE: Multidetector CT imaging of the abdomen and pelvis was performed following the standard protocol without IV contrast. COMPARISON:  None. FINDINGS: Lung bases: Linear and reticular opacities are noted consistent with a combination of scarring and subsegmental atelectasis. Mild dependent pleural thickening. Heart is mildly enlarged. Hepatobiliary: Unremarkable. Spleen and pancreas:  Unremarkable. Adrenal glands:  No masses. Kidneys, ureters, bladder: There are bilateral intrarenal stones. Many of these are likely vascular. Several collecting system stones are suggested. No renal masses. Slight prominence of the right renal pelvis and proximal ureter. Remaining portions of the ureter decompressed. No ureteral stone is seen. Bladder is mildly distended. Wall appears mildly thickened. No bladder mass or stone. Uterus and adnexa:  Uterus surgically absent.  No pelvic masses. Lymph nodes:  No adenopathy. Ascites:  None. Vascular: Dense atherosclerotic calcifications are noted along the aorta as well as the branch vessels. Gastrointestinal: Colon is moderately  distended with stool as is the rectum. Right colon is dilated to a maximum of 7.5 cm at the cecum. There is no colonic wall thickening or adjacent inflammation. Several diverticula noted along the left colon without inflammatory changes. Appendix not seen. No evidence of appendicitis. Stomach and small bowel are unremarkable. Musculoskeletal:  No osteoblastic or osteolytic lesions. IMPRESSION: 1. No acute findings within the abdomen or pelvis. 2. There is moderate increased stool in the colon rectum with mild colonic dilation most evident above the cecum which measures 7.5 cm. No bowel inflammatory changes and no evidence of obstruction. 3. Several intrarenal stones in each kidney. No ureteral stone or obstructive uropathy. Electronically Signed   By: Lajean Manes M.D.   On: 10/03/2015 19:56    IMPRESSION: Principal Problem:   Abdominal pain of unknown etiology Active Problems:   Coronary artery disease involving left main coronary artery   Elevated troponin   S/P CABG x 3   ESRD (end stage renal disease) on dialysis ([place])   Type II diabetes mellitus with complication (HCC)   RECOMMENDATION: Principal Problem:   Abdominal pain of unknown etiology - this is truly be reasonable patient is in the hospital, and not at all related to anginal type symptoms. I suspect it is probably related to constipation as she feels better having a bowel movement. Active Problems:   Coronary artery disease involving left main coronary artery /  S/P CABG x 3  No active angina symptoms or heart failure symptoms. Her BFI follow-up echocardiogram.  She is on aspirin, statin, hydralazine (as opposed to ACE inhibitor/ARB given her ESRD), and metoprolol.  No need for further cardiac evaluation during his hospital stay. She will need to have some assistance with setting of cardiology follow-up at [facility] on discharge.   Elevated troponin: No clear indication for having checked a troponin level in this patient with no active anginal type symptoms. 1 slightly abnormal reading with 2: Normal readings would suggest that this is simply either lab error or related to her being on dialysis. I don't think he would be considered to be demand ischemia as this is not enough to suggest ischemia. I would not do any further evaluation in which should be disregarded this lab    ESRD (end stage renal disease) on dialysis ([place]) - per internal medicine and nephrology   Type II diabetes mellitus with complication ([place]) - per internal medicine and nephrology   Essentially no additional cardiac evaluation uterine this hospital stay. We'll sign off. Please ensure the patient has cardiology follow-up with [facility] upon discharge.   Time Spent Directly with Patient:  > 50  % of the time was in direct patient consultation. 30 minutes     Glenetta Hew, M.D., M.S. Interventional Cardiologist   Pager # [phone] Phone # [phone] [address] [place], [place] [address]

## 2015-10-04 NOTE — Clinical Social Work Note (Signed)
Clinical Social Work Assessment  Patient Details  Name: Stephanie Ellis MRN: [id number] Date of Birth: [birth year]  Date of referral:  10/04/15               Reason for consult:  Discharge Planning                Permission sought to share information with:  Family Supports Permission granted to share information::  Yes, Verbal Permission Granted  Name::        Agency::     Relationship::   ([name]- Daghter)  Contact Information:     Housing/Transportation Living arrangements for the past 2 months:  [place], [place] of Information:  Patient Patient Interpreter Needed:  None Criminal Activity/Legal Involvement Pertinent to Current Situation/Hospitalization:  No - Comment as needed Significant Relationships:  Adult Children, Other Family Members Lives with:  Self, Facility Resident Do you feel safe going back to the place where you live?  Yes Need for family participation in patient care:  Yes (Comment) ([name] - Daughter)  Care giving concerns:  Patient is at [facility] for STR.    Social Worker assessment / plan:  CSW met with patient at bedside. CSW introduced herself and her role. Per patient she is from [facility]. Stated she is receiving STR there. Reported she'd like to return at discharge. Reports that she understands that she'll be discharged today. Stated that she would like to be transported  Via her daughter [name]. Provided CSW with her number [phone]. CSW left voicemail. Awaiting phone call back. Verbal permission was granted to coordinate discharge with [facility]. CSW spoke to [place] in admissions at [facility]. Per [place] patient can return today.  Clinical Social Worker informed that patient will be medically ready to discharge to [facility]. Patient nd her family are in a agreement with plan. CSW called [facility] to confirm that patient's bed is ready. Provided patient's room number 27b and number to call for report [phone] . All discharge information faxed to  [facility] via [place]. RN will call report and patient will discharge to [facility] via her family.    Employment status:  Retired Forensic scientist:  Medicaid In [place] PT Recommendations:  Not assessed at this time Information / Referral to community resources:     Patient/Family's Response to care:  Patient is in agreement to discharge to [facility].   Patient/Family's Understanding of and Emotional Response to Diagnosis, Current Treatment, and Prognosis:  Patient was apperceptive to CSW's assistance.   Emotional Assessment Appearance:  Appears stated age Attitude/Demeanor/Rapport:   (None) Affect (typically observed):  Calm, Pleasant Orientation:  Oriented to Self, Oriented to Place, Oriented to Situation Alcohol / Substance use:  Not Applicable Psych involvement (Current and /or in the community):  No (Comment)  Discharge Needs  Concerns to be addressed:  Discharge Planning Concerns Readmission within the last 30 days:  No Current discharge risk:  None Barriers to Discharge:  No Barriers Identified   [place], LCSW 10/04/2015, 1:22 PM

## 2015-10-04 NOTE — Progress Notes (Signed)
Patient d/c'd to [facility], report called. Education provided, no questions at this time. Patient picked up by daughter. Telemetry removed. Wilnette Kales

## 2015-10-04 NOTE — NC FL2 (Signed)
[place] LEVEL OF CARE SCREENING TOOL     IDENTIFICATION  Patient Name: Stephanie Ellis Birthdate: Jun 17, 1950 Sex: female Admission Date (Current Location): 10/03/2015  [facility] and [place] Number:  [name]  ([id number]) Facility and Address:  [facility], [address] [place], [place] [address]      Provider Number: [id number]  Attending Physician Name and Address:  Epifanio Lesches, MD  Relative Name and Phone Number:       Current Level of Care: Hospital Recommended Level of Care: [place] Prior Approval Number:    Date Approved/Denied:   PASRR Number:  [id number])  Discharge Plan: SNF    Current Diagnoses: Patient Active Problem List   Diagnosis Date Noted  . Coronary artery disease involving left main coronary artery 10/04/2015  . Abdominal pain of unknown etiology 10/04/2015  . ESRD (end stage renal disease) on dialysis ([place])   . Type II diabetes mellitus with complication ([place])   . NSTEMI (non-ST elevated myocardial infarction) ([place])   . Right sided abdominal pain   . Elevated troponin 10/03/2015  . S/P CABG x 3 03/24/2015    Orientation RESPIRATION BLADDER Height & Weight     Self, Time, Situation, Place  Normal Continent Weight: 127 lb 9.6 oz (57.879 kg) Height:  5' 4"  (162.6 cm)  BEHAVIORAL SYMPTOMS/MOOD NEUROLOGICAL BOWEL NUTRITION STATUS   (None)  (None) Continent Diet (Carb Modified )  AMBULATORY STATUS COMMUNICATION OF NEEDS Skin   Limited Assist Verbally Normal                       Personal Care Assistance Level of Assistance  Bathing, Feeding, Dressing Bathing Assistance: Limited assistance Feeding assistance: Independent Dressing Assistance: Limited assistance     Functional Limitations Info  Sight, Hearing, Speech Sight Info: Adequate Hearing Info: Adequate Speech Info: Adequate    SPECIAL CARE FACTORS FREQUENCY  PT (By licensed PT), OT (By licensed OT)     PT  Frequency:  (5) OT Frequency:  (5)            Contractures      Additional Factors Info  Code Status, Allergies, Insulin Sliding Scale Code Status Info:  (Full Code) Allergies Info:  (Chlorthalidone, Ace Inhibitors, Angiotensin Receptor Blockers, Norvasc)   Insulin Sliding Scale Info:  (insulin glargine (LANTUS) injection 14 Units 14 Units, Subcutaneous, Daily )       Current Medications (10/04/2015):  This is the current hospital active medication list Current Facility-Administered Medications  Medication Dose Route Frequency Provider Last Rate Last Dose  . 0.9 %  sodium chloride infusion   Intravenous Continuous Harrie Foreman, MD      . acetaminophen (TYLENOL) tablet 650 mg  650 mg Oral Q6H PRN Harrie Foreman, MD       Or  . acetaminophen (TYLENOL) suppository 650 mg  650 mg Rectal Q6H PRN Harrie Foreman, MD      . aspirin EC tablet 325 mg  325 mg Oral Daily Harrie Foreman, MD   325 mg at 10/04/15 0933  . atorvastatin (LIPITOR) tablet 40 mg  40 mg Oral Daily Harrie Foreman, MD   40 mg at 10/04/15 0933  . bisacodyl (DULCOLAX) suppository 10 mg  10 mg Rectal PRN Harrie Foreman, MD      . calcium acetate [facility]) capsule 2,001 mg  2,001 mg Oral TID WC Harrie Foreman, MD   2,001 mg at 10/04/15 0933  . cholecalciferol (  VITAMIN D) tablet 2,000 Units  2,000 Units Oral Daily Harrie Foreman, MD   2,000 Units at 10/04/15 [phone]  . cloNIDine (CATAPRES - Dosed in mg/24 hr) patch 0.1 mg  0.1 mg Transdermal Weekly Harrie Foreman, MD   0.1 mg at 10/04/15 0345  . docusate sodium (COLACE) capsule 100 mg  100 mg Oral BID Harrie Foreman, MD   100 mg at 10/04/15 0934  . famotidine (PEPCID) tablet 20 mg  20 mg Oral Daily Harrie Foreman, MD   20 mg at 10/04/15 0933  . ferrous sulfate tablet 325 mg  325 mg Oral Q breakfast Harrie Foreman, MD   325 mg at 10/04/15 0933  . gabapentin (NEURONTIN) capsule 100 mg  100 mg Oral Daily Harrie Foreman, MD   100 mg at 10/04/15  0934  . heparin injection 5,000 Units  5,000 Units Subcutaneous Q8H Harrie Foreman, MD   5,000 Units at 10/04/15 6720  . hydrALAZINE (APRESOLINE) tablet 50 mg  50 mg Oral TID Harrie Foreman, MD   50 mg at 10/04/15 0934  . insulin glargine (LANTUS) injection 14 Units  14 Units Subcutaneous Daily Harrie Foreman, MD   14 Units at 10/04/15 [phone]  . levofloxacin (LEVAQUIN) tablet 500 mg  500 mg Oral QODAY Harrie Foreman, MD   500 mg at 10/04/15 0934  . megestrol (MEGACE) 40 MG/ML suspension 400 mg  400 mg Oral Daily Harrie Foreman, MD   400 mg at 10/04/15 0935  . metoprolol tartrate (LOPRESSOR) tablet 25 mg  25 mg Oral BID Harrie Foreman, MD   25 mg at 10/04/15 0934  . multivitamin with minerals tablet 1 tablet  1 tablet Oral Daily Harrie Foreman, MD   1 tablet at 10/04/15 0934  . ondansetron (ZOFRAN) tablet 4 mg  4 mg Oral Q6H PRN Harrie Foreman, MD       Or  . ondansetron [facility]) injection 4 mg  4 mg Intravenous Q6H PRN Harrie Foreman, MD      . polyethylene glycol [facility] / GLYCOLAX) packet 17 g  17 g Oral Daily Harrie Foreman, MD   17 g at 10/04/15 0934  . polyvinyl alcohol (LIQUIFILM TEARS) 1.4 % ophthalmic solution 2 drop  2 drop Both Eyes Q4H PRN Harrie Foreman, MD      . sodium chloride (OCEAN) 0.65 % nasal spray 1 spray  1 spray Each Nare Q4H PRN Harrie Foreman, MD      . sodium chloride flush (NS) 0.9 % injection 3 mL  3 mL Intravenous Q12H Harrie Foreman, MD   3 mL at 10/04/15 9628     Discharge Medications: Please see discharge summary for a list of discharge medications.  Relevant Imaging Results:  Relevant Lab Results:   Additional Information  (SSN [id number])  Lorenso Quarry Dorise Gangi, LCSW

## 2015-10-04 NOTE — Discharge Summary (Signed)
Stephanie Ellis, is a 65 y.o. female  DOB 1951-04-17  MRN [id number].  Admission date:  10/03/2015  Admitting Physician  Harrie Foreman, MD  Discharge Date:  10/04/2015   Primary MD  No primary care provider on file.  Recommendations for primary care physician for things to follow:    follow up with primary doctor at [facility] cardiology in one week.   Admission Diagnosis  Right sided abdominal pain [R10.9] NSTEMI (non-ST elevated myocardial infarction) [facility]) [I21.4]   Discharge Diagnosis  Right sided abdominal pain [R10.9] NSTEMI (non-ST elevated myocardial infarction) ([place]) [I21.4]   Active Problems:   Elevated troponin   Coronary artery disease involving left main coronary artery      Past Medical History  Diagnosis Date  . Coronary artery disease involving left main coronary artery 01/2015    [facility]: 70% LM, p-mLAD 50-60% (Resting FFR 0.75), mRCA 80-90%, ~40 Ost OM & D1  . S/P CABG x 3 03/24/2015     [facility]: Dr. Marland Kitchen Haithcock: CABG x 3, LIMA to LAD, SVG to RCA, SVG to OM3, EVH  . ESRD (end stage renal disease) on dialysis [facility])     ESRD secondary to acute kidney failure s/p CABG  . Type II diabetes mellitus with complication (HCC)     CAD  . Essential hypertension     Past Surgical History  Procedure Laterality Date  . Coronary artery bypass graft  03/28/15     [facility]: Dr. Waldemar Dickens: LIMA to LAD, SVG to RCA, SVG to OM3, EVH  . Abdominal hysterectomy    . Insertion of dialysis catheter    . Cardiac catheterization  01/2015    [facility]: Ost LM 70%, p-m LAD 50-60% (Rest FFR + @ 0.75), mRCA 80-90%, ostD1 40%, pOM1 40%       History of present illness and  Hospital Course:     Kindly see H&P for history of present illness and admission details, please review complete Labs, Consult reports and Test reports for all  details in brief  HPI  from the history and physical done on the day of admission 65 year old female patient with history of coronary artery disease triple vessel bypass admitted this morning because of elevated troponin of 0.07. Patient came in because of right flank pain and constipation. In the ER relation to the had blood work troponin of 0.07 which is marginally elevated so because of that she is admitted to hospitalist service. Patient denied chest pain, she says  she never had chest pain. Patient troponins did come down to 0.03 further for 2 times.   Hospital Course  1 abdominal pain secondary to severe constipation resolved with stool softeners. Patient advised to continue stool softeners at discharge. Marginally elevated troponins of unclear significance. Patient to the troponins are decreased from 0.07-0.03. Patient is eager to go home. No chest pain. [facility]  .patient was at [facility] and had triple bypass in December  2016  #3 ESRD on hemodialysis Tuesday Thursday Saturday. Last hemodialysis was yesterday and she completed her dialysis yesterday. Now has no shortness of breath or chest pain. Patient can have a resume the hemodialysis and outpatient tomorrow. #4 diabetes mellitus type 2: Patient can continue  basal insulin, sliding scale insulin. #5 essential hypertension: Continue home medications:. #6 history of coronary artery disease and CABG before. Continue aspirin, statins, beta blockers CENTRAL LINE (Vascath) , Bronchoscopy 04/29/15, Chest tube insertion 04/14/15, TVP placement 04/09/15, Placement of permanent tunneled HD cath 06/22/15, 4/22  nontunneled line placement by VIR 74. 65 year old female patient with history of essential hypertension, diabetes mellitus type 2, chronic diastolic heart failure had a cardiac catheter showing diffuse disease and patient had a bypass surgery in December 2016. Postoperative course was complicated by ESRD requiring dialysis, cardiac arrest  in December, patient was intubated,, extubated, had a prolonged hospital course up until June of this year from December of last year. and sent to [place] in June. Patient was seen by ID physician at [facility], she was discharged with IV ceftaz 2 g after hemodialysis for a Pseudomonas infection of the sternal wound. and stop date June 16. Which will complete her 6 weeks of IV abx for sternal wound infection,  Patient is to follow up with [facility] ID for repeating chest x-ray in 3 weeks and also CBC,ESR, CRP in 3 weeks,  Follow UP      Follow-up Information    Follow up In 1 week.      Follow up with primary cardiologist in one week In 1 week.      Advised tp keep  appointments with primary cardiologist, infectious disease doctor.  Discharge Instructions  and  Discharge Medications        Medication List    STOP taking these medications        insulin aspart protamine- aspart (70-30) 100 UNIT/ML injection  Commonly known as:  NOVOLOG MIX 70/30      TAKE these medications        aspirin 81 MG chewable tablet  Chew 81 mg by mouth daily.     atorvastatin 40 MG tablet  Commonly known as:  LIPITOR  Take 40 mg by mouth daily.     bisacodyl 10 MG suppository  Commonly known as:  DULCOLAX  Place 10 mg rectally as needed for moderate constipation.     calcium acetate 667 MG capsule  Commonly known as:  PHOSLO  Take 2,001 mg by mouth 3 (three) times daily with meals.     cholecalciferol 1000 units tablet  Commonly known as:  VITAMIN D  Take 2,000 Units by mouth daily.     cloNIDine 0.1 mg/24hr patch  Commonly known as:  CATAPRES - Dosed in mg/24 hr  Place 0.1 mg onto the skin once a week.     docusate sodium 100 MG capsule  Commonly known as:  COLACE  Take 100 mg by mouth 2 (two) times daily.     famotidine 20 MG tablet  Commonly known as:  PEPCID  Take 20 mg by mouth daily.     ferrous sulfate 325 (65 FE) MG tablet  Take 325 mg by mouth daily with breakfast.      gabapentin 100 MG capsule  Commonly known as:  NEURONTIN  Take 100 mg by mouth daily.     hydrALAZINE 50 MG tablet  Commonly known as:  APRESOLINE  Take 50 mg by mouth 3 (three) times daily.     insulin glargine 100 unit/mL Sopn  Commonly known as:  LANTUS  Inject 14 Units into the skin daily.     levofloxacin 250 MG tablet  Commonly known as:  LEVAQUIN  Take 500 mg by mouth every other day.     megestrol 40 MG/ML suspension  Commonly known as:  MEGACE  Take 400 mg by mouth daily.     metoprolol tartrate 25 MG tablet  Commonly known as:  LOPRESSOR  Take 25 mg by mouth 2 (two) times daily.  multivitamin with minerals Tabs tablet  Take 1 tablet by mouth daily.     polyethylene glycol packet  Commonly known as:  MIRALAX / GLYCOLAX  Take 17 g by mouth daily as needed for mild constipation.     polyvinyl alcohol 1.4 % ophthalmic solution  Commonly known as:  LIQUIFILM TEARS  Place 2 drops into both eyes every 4 (four) hours as needed for dry eyes.     sodium chloride 0.65 % Soln nasal spray  Commonly known as:  OCEAN  Place 1 spray into both nostrils every 4 (four) hours as needed for congestion.          Diet and Activity recommendation: See Discharge Instructions above   Consults obtained -none   Major procedures and Radiology Reports - PLEASE review detailed and final reports for all details, in brief -     Ct Abdomen Pelvis Wo Contrast  10/03/2015  CLINICAL DATA:  Sudden onset of right lower quadrant pain approximately 2 hours ago at dialysis. Pain gone now. EXAM: CT ABDOMEN AND PELVIS WITHOUT CONTRAST TECHNIQUE: Multidetector CT imaging of the abdomen and pelvis was performed following the standard protocol without IV contrast. COMPARISON:  None. FINDINGS: Lung bases: Linear and reticular opacities are noted consistent with a combination of scarring and subsegmental atelectasis. Mild dependent pleural thickening. Heart is mildly enlarged. Hepatobiliary:  Unremarkable. Spleen and pancreas:  Unremarkable. Adrenal glands:  No masses. Kidneys, ureters, bladder: There are bilateral intrarenal stones. Many of these are likely vascular. Several collecting system stones are suggested. No renal masses. Slight prominence of the right renal pelvis and proximal ureter. Remaining portions of the ureter decompressed. No ureteral stone is seen. Bladder is mildly distended. Wall appears mildly thickened. No bladder mass or stone. Uterus and adnexa:  Uterus surgically absent.  No pelvic masses. Lymph nodes:  No adenopathy. Ascites:  None. Vascular: Dense atherosclerotic calcifications are noted along the aorta as well as the branch vessels. Gastrointestinal: Colon is moderately distended with stool as is the rectum. Right colon is dilated to a maximum of 7.5 cm at the cecum. There is no colonic wall thickening or adjacent inflammation. Several diverticula noted along the left colon without inflammatory changes. Appendix not seen. No evidence of appendicitis. Stomach and small bowel are unremarkable. Musculoskeletal:  No osteoblastic or osteolytic lesions. IMPRESSION: 1. No acute findings within the abdomen or pelvis. 2. There is moderate increased stool in the colon rectum with mild colonic dilation most evident above the cecum which measures 7.5 cm. No bowel inflammatory changes and no evidence of obstruction. 3. Several intrarenal stones in each kidney. No ureteral stone or obstructive uropathy. Electronically Signed   By: Lajean Manes M.D.   On: 10/03/2015 19:56    Micro Results    Recent Results (from the past 240 hour(s))  MRSA PCR Screening     Status: None   Collection Time: 10/03/15 11:19 PM  Result Value Ref Range Status   MRSA by PCR NEGATIVE NEGATIVE Final    Comment:        The GeneXpert MRSA Assay (FDA approved for NASAL specimens only), is one component of a comprehensive MRSA colonization surveillance program. It is not intended to diagnose  MRSA infection nor to guide or monitor treatment for MRSA infections.        Today   Subjective:   Stephanie Ellis today has no headache,no chest abdominal pain,no new weakness tingling or numbness, feels much better wants to go home today.   Objective:  Blood pressure 140/48, pulse 84, temperature 98.2 F (36.8 C), temperature source Oral, resp. rate 20, height 5' 4"  (1.626 m), weight 57.879 kg (127 lb 9.6 oz), SpO2 96 %.   Intake/Output Summary (Last 24 hours) at 10/04/15 1100 Last data filed at 10/04/15 0950  Gross per 24 hour  Intake      0 ml  Output      0 ml  Net      0 ml    Exam Awake Alert, Oriented x 3, No new F.N deficits, Normal affect [place].AT,PERRAL Supple Neck,No JVD, No cervical lymphadenopathy appriciated.  Symmetrical Chest wall movement, Good air movement bilaterally, CTAB RRR,No Gallops,Rubs or new Murmurs, No Parasternal Heave +ve B.Sounds, Abd Soft, Non tender, No organomegaly appriciated, No rebound -guarding or rigidity. No Cyanosis, Clubbing or edema, No new Rash or bruise  Data Review   CBC w Diff:  Lab Results  Component Value Date   WBC 14.8* 10/03/2015   HGB 9.3* 10/03/2015   HCT 29.6* 10/03/2015   PLT 283 10/03/2015   LYMPHOPCT 11% 10/03/2015   MONOPCT 9% 10/03/2015   EOSPCT 2% 10/03/2015   BASOPCT 0% 10/03/2015    CMP:  Lab Results  Component Value Date   NA 132* 10/03/2015   K 4.0 10/03/2015   CL 93* 10/03/2015   CO2 25 10/03/2015   BUN 24* 10/03/2015   CREATININE 3.50* 10/03/2015   PROT 8.6* 10/03/2015   ALBUMIN 3.4* 10/03/2015   BILITOT 0.5 10/03/2015   ALKPHOS 95 10/03/2015   AST 22 10/03/2015   ALT 18 10/03/2015  .   Total Time in preparing paper work, data evaluation and todays exam - 81 minutes  Linna Thebeau M.D on 10/04/2015 at 11:00 AM    Note: This dictation was prepared with Dragon dictation along with smaller phrase technology. Any transcriptional errors that result from this process are  unintentional.

## 2015-10-09 ENCOUNTER — Encounter: Payer: Medicaid Other | Admitting: Physician Assistant

## 2016-01-11 ENCOUNTER — Encounter: Payer: Self-pay | Admitting: *Deleted

## 2016-01-18 ENCOUNTER — Encounter
Admission: RE | Disposition: A | Discharge status: Home / Self Care | Payer: Self-pay | Source: Ambulatory Visit | Attending: Ophthalmology

## 2016-01-18 ENCOUNTER — Ambulatory Visit: Payer: Medicaid Other | Admitting: Anesthesiology

## 2016-01-18 ENCOUNTER — Encounter: Payer: Self-pay | Admitting: *Deleted

## 2016-01-18 ENCOUNTER — Ambulatory Visit
Admission: RE | Admit: 2016-01-18 | Discharge: 2016-01-18 | Disposition: A | Discharge status: Home / Self Care | Payer: Medicaid Other | Source: Ambulatory Visit | Attending: Ophthalmology | Admitting: Ophthalmology

## 2016-01-18 DIAGNOSIS — Z7982 Long term (current) use of aspirin: Secondary | ICD-10-CM | POA: Insufficient documentation

## 2016-01-18 DIAGNOSIS — I2510 Atherosclerotic heart disease of native coronary artery without angina pectoris: Secondary | ICD-10-CM | POA: Insufficient documentation

## 2016-01-18 DIAGNOSIS — I252 Old myocardial infarction: Secondary | ICD-10-CM | POA: Insufficient documentation

## 2016-01-18 DIAGNOSIS — E1122 Type 2 diabetes mellitus with diabetic chronic kidney disease: Secondary | ICD-10-CM | POA: Insufficient documentation

## 2016-01-18 DIAGNOSIS — Z794 Long term (current) use of insulin: Secondary | ICD-10-CM | POA: Diagnosis not present

## 2016-01-18 DIAGNOSIS — Z951 Presence of aortocoronary bypass graft: Secondary | ICD-10-CM | POA: Insufficient documentation

## 2016-01-18 DIAGNOSIS — E119 Type 2 diabetes mellitus without complications: Secondary | ICD-10-CM | POA: Diagnosis not present

## 2016-01-18 DIAGNOSIS — I499 Cardiac arrhythmia, unspecified: Secondary | ICD-10-CM | POA: Diagnosis not present

## 2016-01-18 DIAGNOSIS — I509 Heart failure, unspecified: Secondary | ICD-10-CM | POA: Diagnosis not present

## 2016-01-18 DIAGNOSIS — I132 Hypertensive heart and chronic kidney disease with heart failure and with stage 5 chronic kidney disease, or end stage renal disease: Secondary | ICD-10-CM | POA: Diagnosis not present

## 2016-01-18 DIAGNOSIS — N186 End stage renal disease: Secondary | ICD-10-CM | POA: Diagnosis not present

## 2016-01-18 DIAGNOSIS — K219 Gastro-esophageal reflux disease without esophagitis: Secondary | ICD-10-CM | POA: Insufficient documentation

## 2016-01-18 DIAGNOSIS — Z79899 Other long term (current) drug therapy: Secondary | ICD-10-CM | POA: Diagnosis not present

## 2016-01-18 DIAGNOSIS — E7800 Pure hypercholesterolemia, unspecified: Secondary | ICD-10-CM | POA: Insufficient documentation

## 2016-01-18 DIAGNOSIS — H2512 Age-related nuclear cataract, left eye: Secondary | ICD-10-CM | POA: Insufficient documentation

## 2016-01-18 DIAGNOSIS — I110 Hypertensive heart disease with heart failure: Secondary | ICD-10-CM | POA: Diagnosis not present

## 2016-01-18 DIAGNOSIS — D649 Anemia, unspecified: Secondary | ICD-10-CM | POA: Insufficient documentation

## 2016-01-18 HISTORY — DX: Heart failure, unspecified: I50.9

## 2016-01-18 HISTORY — DX: Gastro-esophageal reflux disease without esophagitis: K21.9

## 2016-01-18 HISTORY — DX: Anorexia: R63.0

## 2016-01-18 HISTORY — DX: Anemia, unspecified: D64.9

## 2016-01-18 HISTORY — DX: Cardiac murmur, unspecified: R01.1

## 2016-01-18 HISTORY — PX: CATARACT EXTRACTION W/PHACO: SHX586

## 2016-01-18 LAB — POCT I-STAT 4, (NA,K, GLUC, HGB,HCT)
Glucose, Bld: 175 mg/dL — ABNORMAL HIGH (ref 65–99)
HCT: 39 % (ref 36.0–46.0)
Hemoglobin: 13.3 g/dL (ref 12.0–15.0)
Potassium: 4.1 mmol/L (ref 3.5–5.1)
Sodium: 140 mmol/L (ref 135–145)

## 2016-01-18 LAB — GLUCOSE, CAPILLARY: Glucose-Capillary: 166 mg/dL — ABNORMAL HIGH (ref 65–99)

## 2016-01-18 SURGERY — PHACOEMULSIFICATION, CATARACT, WITH IOL INSERTION
Anesthesia: Monitor Anesthesia Care | Site: Eye | Laterality: Left | Wound class: Clean

## 2016-01-18 MED ORDER — LIDOCAINE HCL (PF) 4 % IJ SOLN
INTRAMUSCULAR | Status: AC
  Filled 2016-01-18: qty 5

## 2016-01-18 MED ORDER — SODIUM HYALURONATE 10 MG/ML IO SOLN
INTRAOCULAR | Status: AC
  Filled 2016-01-18: qty 0.85

## 2016-01-18 MED ORDER — SODIUM HYALURONATE 23 MG/ML IO SOLN
INTRAOCULAR | Status: DC | PRN
  Administered 2016-01-18: 0.6 mL via INTRAOCULAR

## 2016-01-18 MED ORDER — CYCLOPENTOLATE HCL 2 % OP SOLN
1.0000 [drp] | OPHTHALMIC | Status: AC
Start: 2016-01-18 — End: 2016-01-18
  Administered 2016-01-18 (×4): 1 [drp] via OPHTHALMIC

## 2016-01-18 MED ORDER — TETRACAINE HCL 0.5 % OP SOLN
OPHTHALMIC | Status: AC
  Administered 2016-01-18: 1 [drp] via OPHTHALMIC
  Filled 2016-01-18: qty 2

## 2016-01-18 MED ORDER — SODIUM HYALURONATE 10 MG/ML IO SOLN
INTRAOCULAR | Status: DC | PRN
  Administered 2016-01-18: 0.85 mL via INTRAOCULAR

## 2016-01-18 MED ORDER — LIDOCAINE HCL 3.5 % OP GEL
1.0000 "application " | Freq: Once | OPHTHALMIC | Status: AC
  Administered 2016-01-18: 1 via OPHTHALMIC

## 2016-01-18 MED ORDER — MOXIFLOXACIN HCL 0.5 % OP SOLN
OPHTHALMIC | Status: AC
  Administered 2016-01-18: 1 [drp] via OPHTHALMIC
  Filled 2016-01-18: qty 3

## 2016-01-18 MED ORDER — MIDAZOLAM HCL 2 MG/2ML IJ SOLN
INTRAMUSCULAR | Status: DC | PRN
  Administered 2016-01-18: 0.5 mg via INTRAVENOUS

## 2016-01-18 MED ORDER — MOXIFLOXACIN HCL 0.5 % OP SOLN
OPHTHALMIC | Status: DC | PRN
  Administered 2016-01-18: 1 [drp] via OPHTHALMIC

## 2016-01-18 MED ORDER — MOXIFLOXACIN HCL 0.5 % OP SOLN: 1.0000 [drp] | OPHTHALMIC | Status: AC

## 2016-01-18 MED ORDER — LIDOCAINE HCL 3.5 % OP GEL
OPHTHALMIC | Status: AC
  Administered 2016-01-18: 1 via OPHTHALMIC
  Filled 2016-01-18: qty 1

## 2016-01-18 MED ORDER — SODIUM CHLORIDE 0.9 % IV SOLN
INTRAVENOUS | Status: DC
  Administered 2016-01-18: 07:00:00 via INTRAVENOUS

## 2016-01-18 MED ORDER — LIDOCAINE HCL (PF) 4 % IJ SOLN
INTRAOCULAR | Status: DC | PRN
  Administered 2016-01-18: 4 mL via OPHTHALMIC

## 2016-01-18 MED ORDER — CYCLOPENTOLATE HCL 2 % OP SOLN
OPHTHALMIC | Status: AC
  Administered 2016-01-18: 1 [drp] via OPHTHALMIC
  Filled 2016-01-18: qty 2

## 2016-01-18 MED ORDER — PHENYLEPHRINE HCL 10 % OP SOLN: 1.0000 [drp] | OPHTHALMIC | Status: DC

## 2016-01-18 MED ORDER — SODIUM HYALURONATE 23 MG/ML IO SOLN
INTRAOCULAR | Status: AC
  Filled 2016-01-18: qty 0.6

## 2016-01-18 MED ORDER — EPINEPHRINE HCL 1 MG/ML IJ SOLN
INTRAMUSCULAR | Status: AC
  Filled 2016-01-18: qty 2

## 2016-01-18 MED ORDER — EPINEPHRINE HCL 1 MG/ML IJ SOLN
INTRAOCULAR | Status: DC | PRN
  Administered 2016-01-18: 1 mL via OPHTHALMIC

## 2016-01-18 MED ORDER — MOXIFLOXACIN HCL 0.5 % OP SOLN
1.0000 [drp] | OPHTHALMIC | Status: AC
  Administered 2016-01-18 (×3): 1 [drp] via OPHTHALMIC

## 2016-01-18 MED ORDER — PHENYLEPHRINE HCL 10 % OP SOLN
1.0000 [drp] | OPHTHALMIC | Status: AC
Start: 2016-01-18 — End: 2016-01-18
  Administered 2016-01-18 (×4): 1 [drp] via OPHTHALMIC

## 2016-01-18 MED ORDER — POVIDONE-IODINE 5 % OP SOLN
OPHTHALMIC | Status: AC
  Administered 2016-01-18: 1 via OPHTHALMIC
  Filled 2016-01-18: qty 30

## 2016-01-18 MED ORDER — CYCLOPENTOLATE HCL 2 % OP SOLN: 1.0000 [drp] | OPHTHALMIC | Status: DC

## 2016-01-18 MED ORDER — TETRACAINE HCL 0.5 % OP SOLN
1.0000 [drp] | Freq: Once | OPHTHALMIC | Status: AC
  Administered 2016-01-18: 1 [drp] via OPHTHALMIC

## 2016-01-18 MED ORDER — POVIDONE-IODINE 5 % OP SOLN
1.0000 "application " | Freq: Once | OPHTHALMIC | Status: AC
  Administered 2016-01-18: 1 via OPHTHALMIC

## 2016-01-18 MED ORDER — PHENYLEPHRINE HCL 10 % OP SOLN
OPHTHALMIC | Status: AC
  Administered 2016-01-18: 1 [drp] via OPHTHALMIC
  Filled 2016-01-18: qty 5

## 2016-01-18 SURGICAL SUPPLY — 22 items
CANNULA ANT/CHMB 27GA (MISCELLANEOUS) ×4 IMPLANT
CUP MEDICINE 2OZ PLAST GRAD ST (MISCELLANEOUS) ×2 IMPLANT
GLOVE BIO SURGEON STRL SZ8 (GLOVE) ×2 IMPLANT
GLOVE BIOGEL M 6.5 STRL (GLOVE) ×2 IMPLANT
GLOVE SURG LX 7.5 STRW (GLOVE) ×1
GLOVE SURG LX STRL 7.5 STRW (GLOVE) ×1 IMPLANT
GOWN STRL REUS W/ TWL LRG LVL3 (GOWN DISPOSABLE) ×2 IMPLANT
GOWN STRL REUS W/TWL LRG LVL3 (GOWN DISPOSABLE) ×2
LENS IOL ACRSF IQ PC 17.0 (Intraocular Lens) ×1 IMPLANT
LENS IOL ACRYSOF IQ POST 17.0 (Intraocular Lens) ×2 IMPLANT
PACK CATARACT (MISCELLANEOUS) ×2 IMPLANT
PACK CATARACT BRASINGTON LX (MISCELLANEOUS) ×2 IMPLANT
PACK EYE AFTER SURG (MISCELLANEOUS) ×2 IMPLANT
SOL BSS BAG (MISCELLANEOUS) ×2
SOL PREP PVP 2OZ (MISCELLANEOUS) ×2
SOLUTION BSS BAG (MISCELLANEOUS) ×1 IMPLANT
SOLUTION PREP PVP 2OZ (MISCELLANEOUS) ×1 IMPLANT
SYR 3ML LL SCALE MARK (SYRINGE) ×4 IMPLANT
SYR 5ML LL (SYRINGE) ×2 IMPLANT
SYR TB 1ML 27GX1/2 LL (SYRINGE) ×2 IMPLANT
WATER STERILE IRR 250ML POUR (IV SOLUTION) ×2 IMPLANT
WIPE NON LINTING 3.25X3.25 (MISCELLANEOUS) ×2 IMPLANT

## 2016-01-18 NOTE — Discharge Instructions (Signed)
Eye Surgery Discharge Instructions  Expect mild scratchy sensation or mild soreness. DO NOT RUB YOUR EYE!  The day of surgery:  Minimal physical activity, but bed rest is not required  No reading, computer work, or close hand work  No bending, lifting, or straining.  May watch TV  For 24 hours:  No driving, legal decisions, or alcoholic beverages  Safety precautions  Eat anything you prefer: It is better to start with liquids, then soup then solid foods.  _____ Eye patch should be worn until postoperative exam tomorrow.  ____ Solar shield eyeglasses should be worn for comfort in the sunlight/patch while sleeping  Resume all regular medications including aspirin or Coumadin if these were discontinued prior to surgery. You may shower, bathe, shave, or wash your hair. Tylenol may be taken for mild discomfort.  Call your doctor if you experience significant pain, nausea, or vomiting, fever > 101 or other signs of infection. [phone] or [phone] Specific instructions:  Follow-up Information    Benay Pillow, MD .   Specialty:  Ophthalmology Why:  September 29 at 10:15am Contact information: [address] [place] [address] [phone]

## 2016-01-18 NOTE — H&P (Signed)
The History and Physical notes are on paper, have been signed, and are to be scanned. The patient remains stable and unchanged from the H&P.   Previous H&P reviewed, patient examined, and there are no changes.  Benay Pillow 01/18/2016 7:32 AM

## 2016-01-18 NOTE — Anesthesia Preprocedure Evaluation (Signed)
Anesthesia Evaluation  Patient identified by MRN, date of birth, ID band Patient awake    Reviewed: Allergy & Precautions, NPO status , Patient's Chart, lab work & pertinent test results, reviewed documented beta blocker date and time   Airway Mallampati: II       Dental  (+) Chipped   Pulmonary neg pulmonary ROS,    Pulmonary exam normal        Cardiovascular hypertension, Pt. on home beta blockers and Pt. on medications + CAD, + Past MI and +CHF  Normal cardiovascular exam+ dysrhythmias + Valvular Problems/Murmurs      Neuro/Psych PSYCHIATRIC DISORDERS Depression negative neurological ROS     GI/Hepatic GERD  Medicated and Controlled,  Endo/Other  diabetes, Well Controlled, Type 2, Insulin Dependent, Oral Hypoglycemic Agents  Renal/GU Dialysis and ESRFRenal disease  negative genitourinary   Musculoskeletal negative musculoskeletal ROS (+)   Abdominal Normal abdominal exam  (+)   Peds negative pediatric ROS (+)  Hematology  (+) anemia ,   Anesthesia Other Findings   Reproductive/Obstetrics                             Anesthesia Physical Anesthesia Plan  ASA: IV  Anesthesia Plan: MAC   Post-op Pain Management:    Induction: Intravenous  Airway Management Planned: Nasal Cannula  Additional Equipment:   Intra-op Plan:   Post-operative Plan:   Informed Consent: I have reviewed the patients History and Physical, chart, labs and discussed the procedure including the risks, benefits and alternatives for the proposed anesthesia with the patient or authorized representative who has indicated his/her understanding and acceptance.   Dental advisory given  Plan Discussed with: CRNA and Surgeon  Anesthesia Plan Comments:         Anesthesia Quick Evaluation

## 2016-01-18 NOTE — Anesthesia Postprocedure Evaluation (Signed)
Anesthesia Post Note  Patient: Stephanie Ellis  Procedure(s) Performed: Procedure(s) (LRB): CATARACT EXTRACTION PHACO AND INTRAOCULAR LENS PLACEMENT (IOC) (Left)  Patient location during evaluation: PACU Anesthesia Type: MAC Level of consciousness: awake and awake and alert Vital Signs Assessment: vitals unstable and post-procedure vital signs reviewed and stable Respiratory status: spontaneous breathing Cardiovascular status: blood pressure returned to baseline and stable Postop Assessment: no headache and no backache Anesthetic complications: no    Last Vitals:  Vitals:   01/18/16 0645  BP: (!) 152/57  Pulse: 71  Resp: 16  Temp: (!) 36.1 C    Last Pain:  Vitals:   01/18/16 0645  TempSrc: Tympanic                 Nathanael Krist C

## 2016-01-18 NOTE — Op Note (Signed)
OPERATIVE NOTE  Stephanie Ellis [id number] 01/18/2016   PREOPERATIVE DIAGNOSIS:  Nuclear sclerotic cataract left eye.  H25.12   POSTOPERATIVE DIAGNOSIS:    Nuclear sclerotic cataract left eye.     PROCEDURE:  Phacoemusification with posterior chamber intraocular lens placement of the left eye   LENS:   Implant Name Type Inv. Item Serial No. Manufacturer Lot No. LRB No. Used  IMPLANT LENS - [id number] Intraocular Lens IMPLANT LENS [id number] ALCON   Left 1       [id number] 17.0   ULTRASOUND TIME: 1 minutes 05 seconds.  CDE 10.16   SURGEON:  Benay Pillow, MD, MPH   ANESTHESIA:  Topical with tetracaine drops and 2% Xylocaine jelly, augmented with 1% preservative-free intracameral lidocaine.   COMPLICATIONS:  None.   DESCRIPTION OF PROCEDURE:  The patient was identified in the holding room and transported to the operating room and placed in the supine position under the operating microscope.  The left eye was identified as the operative eye and it was prepped and draped in the usual sterile ophthalmic fashion.   A 1.0 millimeter clear-corneal paracentesis was made at the 5:00 position. 0.5 ml of preservative-free 1% lidocaine with epinephrine was injected into the anterior chamber.  The anterior chamber was filled with Healon 5 viscoelastic.  A 2.4 millimeter keratome was used to make a near-clear corneal incision at the 2:00 position.  A curvilinear capsulorrhexis was made with a cystotome and capsulorrhexis forceps.  Balanced salt solution was used to hydrodissect and hydrodelineate the nucleus.   Phacoemulsification was then used in stop and chop fashion to remove the lens nucleus and epinucleus.  The remaining cortex was then removed using the irrigation and aspiration handpiece. Healon was then placed into the capsular bag to distend it for lens placement.  A lens was then injected into the capsular bag.  The remaining viscoelastic was aspirated.   Wounds were hydrated with balanced  salt solution.  The anterior chamber was inflated to a physiologic pressure with balanced salt solution.  Good routine case.  Intracameral vigamox 0.1 mL undiltued was injected into the eye.  No wound leaks were noted.  Topical Vigamox drops were applied to the eye.  The patient was taken to the recovery room in stable condition without complications of anesthesia or surgery  Benay Pillow 01/18/2016, 8:41 AM

## 2016-01-18 NOTE — Transfer of Care (Signed)
Immediate Anesthesia Transfer of Care Note  Patient: Stephanie Ellis  Procedure(s) Performed: Procedure(s) with comments: CATARACT EXTRACTION PHACO AND INTRAOCULAR LENS PLACEMENT (IOC) (Left) - [place] 1.05 AP% 15.5 CDE 10.16 Fluid Pack Lot # [id number]  Patient Location: PACU  Anesthesia Type:MAC  Level of Consciousness: awake, alert  and oriented  Airway & Oxygen Therapy: Patient Spontanous Breathing and Patient connected to nasal cannula oxygen  Post-op Assessment: Report given to RN and Post -op Vital signs reviewed and stable  Post vital signs: Reviewed and stable  Last Vitals:  Vitals:   01/18/16 0645  BP: (!) 152/57  Pulse: 71  Resp: 16  Temp: (!) 36.1 C    Last Pain:  Vitals:   01/18/16 0645  TempSrc: Tympanic         Complications: No apparent anesthesia complications

## 2016-01-19 ENCOUNTER — Encounter: Payer: Self-pay | Admitting: Ophthalmology

## 2016-01-21 DIAGNOSIS — T8859XA Other complications of anesthesia, initial encounter: Secondary | ICD-10-CM

## 2016-01-21 HISTORY — DX: Other complications of anesthesia, initial encounter: T88.59XA

## 2016-02-02 ENCOUNTER — Encounter (INDEPENDENT_AMBULATORY_CARE_PROVIDER_SITE_OTHER): Payer: Self-pay | Admitting: Vascular Surgery

## 2016-02-02 ENCOUNTER — Ambulatory Visit (INDEPENDENT_AMBULATORY_CARE_PROVIDER_SITE_OTHER): Payer: Medicare Other | Admitting: Vascular Surgery

## 2016-02-02 ENCOUNTER — Encounter (INDEPENDENT_AMBULATORY_CARE_PROVIDER_SITE_OTHER): Payer: Self-pay

## 2016-02-02 VITALS — BP 105/62 | HR 68 | Resp 16 | Ht 64.0 in | Wt 126.0 lb

## 2016-02-02 DIAGNOSIS — N186 End stage renal disease: Secondary | ICD-10-CM

## 2016-02-02 DIAGNOSIS — E785 Hyperlipidemia, unspecified: Secondary | ICD-10-CM | POA: Insufficient documentation

## 2016-02-02 DIAGNOSIS — Z794 Long term (current) use of insulin: Secondary | ICD-10-CM

## 2016-02-02 DIAGNOSIS — Z992 Dependence on renal dialysis: Secondary | ICD-10-CM

## 2016-02-02 DIAGNOSIS — E118 Type 2 diabetes mellitus with unspecified complications: Secondary | ICD-10-CM

## 2016-02-02 DIAGNOSIS — Z951 Presence of aortocoronary bypass graft: Secondary | ICD-10-CM

## 2016-02-02 DIAGNOSIS — I10 Essential (primary) hypertension: Secondary | ICD-10-CM | POA: Diagnosis not present

## 2016-02-02 NOTE — Progress Notes (Signed)
Patient ID: Stephanie Ellis, female   DOB: Dec 11, 1950, 65 y.o.   MRN: [id number]  Chief Complaint  Patient presents with  . New Evaluation    Access placement    HPI Stephanie Ellis is a 65 y.o. female.  I am asked to see the patient by Dr. Smith Mince for evaluation of renal failure and need for permanent dialysis access.  The patient reports being on dialysis through a right internal jugular PermCath for about 10 months now. She has renal dysfunction secondary to multiple issues including diabetes and hypertension. She has heart disease and other issues as well. She reports no specific complaints today and is in her usual state of health. Her previous vein mapping showed no usable cephalic vein in either upper extremity. Her upper arm basilic vein did appear to have adequate vein on each arm. She is right-hand dominant. She has never had any dialysis access attempts in either upper extremity.   Past Medical History:  Diagnosis Date  . Anemia   . Anorexia   . CHF (congestive heart failure) ([place])   . Coronary artery disease involving left main coronary artery 01/2015   [facility]: 70% LM, p-mLAD 50-60% (Resting FFR 0.75), mRCA 80-90%, ~40 Ost OM & D1  . Depression   . Dysrhythmia   . ESRD (end stage renal disease) on dialysis [facility])    ESRD secondary to acute kidney failure s/p CABG  . Essential hypertension   . GERD (gastroesophageal reflux disease)   . Heart murmur   . 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 Marland Kitchen Haithcock: CABG x 3, LIMA to LAD, SVG to RCA, SVG to OM3, EVH  . Type II diabetes mellitus with complication (HCC)    CAD    Past Surgical History:  Procedure Laterality Date  . ABDOMINAL HYSTERECTOMY    . CARDIAC CATHETERIZATION  01/2015   [facility]: Ost LM 70%, p-m LAD 50-60% (Rest FFR + @ 0.75), mRCA 80-90%, ostD1 40%, pOM1 40%  . CATARACT EXTRACTION W/PHACO Left 01/18/2016   Procedure: CATARACT EXTRACTION PHACO AND INTRAOCULAR LENS PLACEMENT (IOC);  Surgeon: Eulogio Bear, MD;   Location: [facility] ORS;  Service: Ophthalmology;  Laterality: Left;  [place] 1.05 AP% 15.5 CDE 10.16 Fluid Pack Lot # [id number]  . CORONARY ARTERY BYPASS GRAFT  03/28/15    [facility]: Dr. Waldemar Dickens: LIMA to LAD, SVG to RCA, SVG to OM3, EVH  . INSERTION OF DIALYSIS CATHETER    . TRANSTHORACIC ECHOCARDIOGRAM  January 2017    EF 60-65%. GR 2 DD. Mild degenerative mitral valve disease but no prolapse or regurgitation. Mild left atrial dilation. Mild to moderate LVH. Pericardial effusion gone    Family History  Problem Relation Age of Onset  . Diabetes Mellitus II Mother    bleeding or clotting issues to their knowledge. No aneurysms or autoimmune diseases   Social History Social History  Substance Use Topics  . Smoking status: Never Smoker  . Smokeless tobacco: Never Used  . Alcohol use No  No IVDU  Allergies  Allergen Reactions  . Chlorthalidone Anaphylaxis, Itching and Rash  . Ace Inhibitors Other (See Comments)    Reaction:  Hyperkalemia   . Angiotensin Receptor Blockers Other (See Comments)    Reaction:  Hyperkalemia   . Norvasc [Amlodipine] Itching and Rash    Current Outpatient Prescriptions  Medication Sig Dispense Refill  . aspirin 81 MG chewable tablet Chew 81 mg by mouth daily.    Marland Kitchen atorvastatin (LIPITOR) 40 MG tablet  Take 40 mg by mouth daily.    . bisacodyl (DULCOLAX) 10 MG suppository Place 10 mg rectally as needed for moderate constipation.    . calcium acetate (PHOSLO) 667 MG capsule Take 2,001 mg by mouth 3 (three) times daily with meals.    . cholecalciferol (VITAMIN D) 1000 units tablet Take 2,000 Units by mouth daily.    Marland Kitchen docusate sodium (COLACE) 100 MG capsule Take 100 mg by mouth 2 (two) times daily.    . ferrous sulfate 325 (65 FE) MG tablet Take 325 mg by mouth daily with breakfast.    . insulin glargine (LANTUS) 100 unit/mL SOPN Inject 14 Units into the skin daily.    . megestrol (MEGACE) 40 MG/ML suspension Take 400 mg by mouth daily.    . metoprolol tartrate  (LOPRESSOR) 25 MG tablet Take 25 mg by mouth 2 (two) times daily.    . Multiple Vitamin (MULTIVITAMIN WITH MINERALS) TABS tablet Take 1 tablet by mouth daily.    . polyethylene glycol (MIRALAX / GLYCOLAX) packet Take 17 g by mouth daily as needed for mild constipation.    . polyvinyl alcohol (LIQUIFILM TEARS) 1.4 % ophthalmic solution Place 2 drops into both eyes every 4 (four) hours as needed for dry eyes.    . sodium chloride (OCEAN) 0.65 % SOLN nasal spray Place 1 spray into both nostrils every 4 (four) hours as needed for congestion.     No current facility-administered medications for this visit.       REVIEW OF SYSTEMS (Negative unless checked)  Constitutional: [] Weight loss  [] Fever  [] Chills Cardiac: [] Chest pain   [] Chest pressure   [x] Palpitations   [] Shortness of breath when laying flat   [] Shortness of breath at rest   [] Shortness of breath with exertion. Vascular:  [] Pain in legs with walking   [] Pain in legs at rest   [] Pain in legs when laying flat   [] Claudication   [] Pain in feet when walking  [] Pain in feet at rest  [] Pain in feet when laying flat   [] History of DVT   [] Phlebitis   [x] Swelling in legs   [] Varicose veins   [] Non-healing ulcers Pulmonary:   [] Uses home oxygen   [] Productive cough   [] Hemoptysis   [] Wheeze  [] COPD   [] Asthma Neurologic:  [] Dizziness  [] Blackouts   [] Seizures   [] History of stroke   [] History of TIA  [] Aphasia   [] Temporary blindness   [] Dysphagia   [] Weakness or numbness in arms   [] Weakness or numbness in legs Musculoskeletal:  [] Arthritis   [] Joint swelling   [] Joint pain   [] Low back pain Hematologic:  [] Easy bruising  [] Easy bleeding   [] Hypercoagulable state   [] Anemic  [] Hepatitis Gastrointestinal:  [] Blood in stool   [] Vomiting blood  [] Gastroesophageal reflux/heartburn   [] Abdominal pain Genitourinary:  [x] Chronic kidney disease   [] Difficult urination  [] Frequent urination  [] Burning with urination   [] Hematuria Skin:  [] Rashes    [] Ulcers   [] Wounds Psychological:  [] History of anxiety   []  History of major depression.    Physical Exam BP 105/62 (BP Location: Right Arm)   Pulse 68   Resp 16   Ht 5' 4"  (1.626 m)   Wt 57.2 kg (126 lb)   BMI 21.63 kg/m  Gen:  WD/WN, NAD. Chronically ill appearing Head: [employer]/AT, No temporalis wasting. Prominent temp pulse not noted. Ear/Nose/Throat: Hearing grossly intact, nares w/o erythema or drainage, oropharynx w/o Erythema/Exudate Eyes: PERRLA, EOMI.  Neck: Supple, no nuchal rigidity.  No bruit or JVD.  Pulmonary:  Good air movement, equal bilaterally.  Cardiac: RRR, normal S1, S2, no Murmurs, rubs or gallops. Vascular: right IJ permcath clean, dry and intact Vessel Right Left  Radial Palpable Palpable  Ulnar Palpable Palpable  Brachial Palpable Palpable                           Gastrointestinal: soft, non-tender/non-distended. No guarding/reflex. No masses, surgical incisions, or scars. Musculoskeletal: M/S 5/5 throughout.  Extremities without ischemic changes.  No deformity or atrophy.  Neurologic: Pain and light touch intact in extremities.  Symmetrical.  Speech is fluent. Motor exam as listed above. Mild right facial droop Psychiatric: Judgment intact, Mood & affect appropriate for pt's clinical situation. Dermatologic: No rashes or ulcers noted.  No cellulitis or open wounds. Lymph : No Cervical, Axillary, or Inguinal lymphadenopathy.   Radiology No results found.  Labs Recent Results (from the past 2160 hour(s))  Glucose, capillary     Status: Abnormal   Collection Time: 01/18/16  7:01 AM  Result Value Ref Range   Glucose-Capillary 166 (H) 65 - 99 mg/dL  I-STAT 4, (NA,K, GLUC, HGB,HCT)     Status: Abnormal   Collection Time: 01/18/16  7:32 AM  Result Value Ref Range   Sodium 140 135 - 145 mmol/L   Potassium 4.1 3.5 - 5.1 mmol/L   Glucose, Bld 175 (H) 65 - 99 mg/dL   HCT 39.0 36.0 - 46.0 %   Hemoglobin 13.3 12.0 - 15.0 g/dL     Assessment/Plan:  S/P CABG x 3 No current anginal symptoms  Type II diabetes mellitus with complication (HCC) blood glucose control important in reducing the progression of atherosclerotic disease. Also, involved in wound healing. On appropriate medications.   Hypertension blood pressure control important in reducing the progression of atherosclerotic disease. On appropriate oral medications.   Hyperlipidemia lipid control important in reducing the progression of atherosclerotic disease. Continue statin therapy   ESRD (end stage renal disease) on dialysis [facility]) The patient is in need of permanent dialysis access. She has been catheter dependent now for almost a year. We discussed the differences between catheter-based dialysis as well as AV fistulas and AV grafts. We discussed the preference for placing AV fistula secondary to better long-term patency and lower risk of infection, but adequate superficial veins must be present. We discussed long-term complications of any access including thrombosis, steal syndrome, bleeding, infection, and need for adjuvant therapies to maintain patency. Her previous vein mapping showed no usable cephalic vein in either upper extremity. Her upper arm basilic vein did appear to have adequate vein on each arm. Given that she is right-hand dominant, a left brachiobasilic AV fistula would be our plan. We discussed this would be a 2-stage procedure and 2 separate surgeries would be necessary to create an adequate access. She voices her understanding and is agreeable to proceed.      Leotis Pain 02/02/2016, 11:27 AM   This note was created with Dragon medical transcription system.  Any errors from dictation are unintentional.

## 2016-02-02 NOTE — Assessment & Plan Note (Signed)
blood glucose control important in reducing the progression of atherosclerotic disease. Also, involved in wound healing. On appropriate medications.  

## 2016-02-02 NOTE — Assessment & Plan Note (Signed)
blood pressure control important in reducing the progression of atherosclerotic disease. On appropriate oral medications.  

## 2016-02-02 NOTE — Assessment & Plan Note (Signed)
No current anginal symptoms

## 2016-02-02 NOTE — Assessment & Plan Note (Signed)
lipid control important in reducing the progression of atherosclerotic disease. Continue statin therapy  

## 2016-02-02 NOTE — Patient Instructions (Signed)
Vascular Access for Hemodialysis A vascular access is a connection between two blood vessels that allows blood to be easily removed from the body and returned to the body during hemodialysis. Hemodialysis is a procedure in which a machine outside of the body filters the blood. There are three types of vascular accesses:   Arteriovenous fistula. This is a connection between an artery and a vein (usually in the arm) that is made by sewing them together. Blood in the artery flows directly into the vein, causing it to get larger over time. This makes it easier for the vein to be used for hemodialysis. An arteriovenous fistula takes 1-6 months to develop after surgery.   Arteriovenous graft. This is a connection between an artery and a vein in the arm that is made with a tube. An arteriovenous graft can be used within 2-3 weeks of surgery.   Venous catheter. This is a thin, flexible tube that is placed in a large vein (usually in the neck, chest, or groin). A venous catheter for hemodialysis contains two tubes that come out of the skin. A venous catheter can be used right away. It is usually used as a temporary access if you need hemodialysis before a fistula or graft has developed. It may also be used as a permanent access if a fistula or graft cannot be created. WHICH TYPE OF ACCESS IS BEST FOR ME? The type of access that is best for you depends on the size and strength of your veins.  A fistula is usually the preferred type of access. It can last several years and is less likely than the other types of accesses to become infected or to cause blood clots within a blood vessel (thrombosis). However, a fistula is not an option for everyone. If your veins are not the right size, a graft may be used instead. Grafts require you to have strong veins. If your veins are not strong enough for a graft, a catheter may be used. Catheters are more likely than fistulas and grafts to become infected or to have thrombosis.   Sometimes, only one type of access is an option. Your health care provider will help you determine which type of access is best for you.  HOW IS A VASCULAR ACCESS USED? The way the access is used depends on the type of access:   If the access is a fistula or graft, two needles are inserted through the skin into the access before each hemodialysis session. Blood leaves the body through one of the needles and travels through a tube to the hemodialysis machine (dialyzer). It then flows through another tube and returns to the body through the second needle.   If the access is a catheter, one tube is connected directly to the tube that leads to the dialyzer and the other is connected to a tube that leads away from the dialyzer. Blood leaves the body through one tube and returns to the body through the other.  WHAT KIND OF PROBLEMS CAN OCCUR WITH VASCULAR ACCESSES?  Blood clots within a blood vessel (thrombosis). Thrombosis can lead to a narrowing of a blood vessel or tube (stenosis). If thrombosis occurs frequently, another access site may be created as a backup.   Infection.  These problems are most likely to occur with a venous catheter and least likely to occur with an arteriovenous fistula.  HOW DO I CARE FOR MY VASCULAR ACCESS? Wear a medical alert bracelet. This tells health care providers that you are  a dialysis patient in the case of an emergency and allows them to care for your veins appropriately. If you have a graft or fistula:   A "bruit" is a noise that is heard with a stethoscope and a "thrill" is a vibration felt over the graft or fistula. The presence of the bruit and thrill indicates that the access is working. You will be taught to feel for the thrill each day. If this is not felt, the access may be clotted. Call your health care provider.   You may use the arm where your vascular access is located freely after the site heals. Keep the following in mind:   Avoid pressure on  the arm.   Avoid lifting heavy objects with the arm.   Avoid sleeping on the arm.   Avoid wearing tight-sleeved shirts or jewelry around the graft or fistula.   Do not allow blood pressure monitoring or needle punctures on the side where the graft or fistula is located.   With permission from your health care provider, you may do exercises to help with blood flow through a fistula. These exercises involve squeezing a rubber ball or other soft objects as instructed. SEEK MEDICAL CARE IF:   Chills develop.   You have an oral temperature above 102 F (38.9 C).  Swelling around the graft or fistula gets worse.   New pain develops.   Pus or other fluid (drainage) is seen at the vascular access site.   Skin redness or red streaking is seen on the skin around, above, or below the vascular access. SEEK IMMEDIATE MEDICAL CARE IF:   Pain, numbness, or an unusual pale skin color develops in the hand on the side of your fistula.   Dizziness or weakness develops that you have not had before.   The vascular access has bleeding that cannot be easily controlled.   This information is not intended to replace advice given to you by your health care provider. Make sure you discuss any questions you have with your health care provider.   Document Released: 06/29/2002 Document Revised: 04/29/2014 Document Reviewed: 08/25/2012 Elsevier Interactive Patient Education [employer].

## 2016-02-02 NOTE — Assessment & Plan Note (Signed)
The patient is in need of permanent dialysis access. She has been catheter dependent now for almost a year. We discussed the differences between catheter-based dialysis as well as AV fistulas and AV grafts. We discussed the preference for placing AV fistula secondary to better long-term patency and lower risk of infection, but adequate superficial veins must be present. We discussed long-term complications of any access including thrombosis, steal syndrome, bleeding, infection, and need for adjuvant therapies to maintain patency. Her previous vein mapping showed no usable cephalic vein in either upper extremity. Her upper arm basilic vein did appear to have adequate vein on each arm. Given that she is right-hand dominant, a left brachiobasilic AV fistula would be our plan. We discussed this would be a 2-stage procedure and 2 separate surgeries would be necessary to create an adequate access. She voices her understanding and is agreeable to proceed.

## 2016-02-05 ENCOUNTER — Other Ambulatory Visit (INDEPENDENT_AMBULATORY_CARE_PROVIDER_SITE_OTHER): Payer: Self-pay | Admitting: Vascular Surgery

## 2016-02-06 ENCOUNTER — Inpatient Hospital Stay: Admission: RE | Admit: 2016-02-06 | Payer: Medicaid Other | Source: Ambulatory Visit

## 2016-02-08 ENCOUNTER — Encounter
Admission: RE | Admit: 2016-02-08 | Discharge: 2016-02-08 | Disposition: A | Discharge status: Home / Self Care | Payer: Medicare Other | Source: Ambulatory Visit | Attending: Vascular Surgery | Admitting: Vascular Surgery

## 2016-02-08 DIAGNOSIS — K219 Gastro-esophageal reflux disease without esophagitis: Secondary | ICD-10-CM | POA: Insufficient documentation

## 2016-02-08 DIAGNOSIS — Z01812 Encounter for preprocedural laboratory examination: Secondary | ICD-10-CM | POA: Diagnosis not present

## 2016-02-08 DIAGNOSIS — N186 End stage renal disease: Secondary | ICD-10-CM | POA: Diagnosis not present

## 2016-02-08 DIAGNOSIS — D649 Anemia, unspecified: Secondary | ICD-10-CM | POA: Insufficient documentation

## 2016-02-08 DIAGNOSIS — I2510 Atherosclerotic heart disease of native coronary artery without angina pectoris: Secondary | ICD-10-CM | POA: Insufficient documentation

## 2016-02-08 DIAGNOSIS — E785 Hyperlipidemia, unspecified: Secondary | ICD-10-CM | POA: Insufficient documentation

## 2016-02-08 DIAGNOSIS — I509 Heart failure, unspecified: Secondary | ICD-10-CM | POA: Insufficient documentation

## 2016-02-08 DIAGNOSIS — E1122 Type 2 diabetes mellitus with diabetic chronic kidney disease: Secondary | ICD-10-CM | POA: Insufficient documentation

## 2016-02-08 DIAGNOSIS — I132 Hypertensive heart and chronic kidney disease with heart failure and with stage 5 chronic kidney disease, or end stage renal disease: Secondary | ICD-10-CM | POA: Diagnosis not present

## 2016-02-08 LAB — BASIC METABOLIC PANEL
Anion gap: 13 (ref 5–15)
BUN: 67 mg/dL — ABNORMAL HIGH (ref 6–20)
CO2: 26 mmol/L (ref 22–32)
Calcium: 9.2 mg/dL (ref 8.9–10.3)
Chloride: 100 mmol/L — ABNORMAL LOW (ref 101–111)
Creatinine, Ser: 7.62 mg/dL — ABNORMAL HIGH (ref 0.44–1.00)
GFR calc Af Amer: 6 mL/min — ABNORMAL LOW (ref >60)
GFR calc non Af Amer: 5 mL/min — ABNORMAL LOW (ref >60)
Glucose, Bld: 180 mg/dL — ABNORMAL HIGH (ref 65–99)
Potassium: 4.4 mmol/L (ref 3.5–5.1)
Sodium: 139 mmol/L (ref 135–145)

## 2016-02-08 LAB — CBC WITH DIFFERENTIAL/PLATELET
Basophils Absolute: 0 10*3/uL (ref 0–0.1)
Basophils Relative: 0 %
Eosinophils Absolute: 0.3 10*3/uL (ref 0–0.7)
Eosinophils Relative: 2 %
HCT: 39.7 % (ref 35.0–47.0)
Hemoglobin: 12.9 g/dL (ref 12.0–16.0)
Lymphocytes Relative: 11 %
Lymphs Abs: 1.4 10*3/uL (ref 1.0–3.6)
MCH: 30.6 pg (ref 26.0–34.0)
MCHC: 32.4 g/dL (ref 32.0–36.0)
MCV: 94.5 fL (ref 80.0–100.0)
Monocytes Absolute: 0.8 10*3/uL (ref 0.2–0.9)
Monocytes Relative: 6 %
Neutro Abs: 10.5 10*3/uL — ABNORMAL HIGH (ref 1.4–6.5)
Neutrophils Relative %: 81 %
Platelets: 277 10*3/uL (ref 150–440)
RBC: 4.2 MIL/uL (ref 3.80–5.20)
RDW: 17.9 % — ABNORMAL HIGH (ref 11.5–14.5)
WBC: 12.9 10*3/uL — ABNORMAL HIGH (ref 3.6–11.0)

## 2016-02-08 LAB — TYPE AND SCREEN
ABO/RH(D): B POS
Antibody Screen: NEGATIVE

## 2016-02-08 LAB — PROTIME-INR
INR: 0.99
Prothrombin Time: 13.1 seconds (ref 11.4–15.2)

## 2016-02-08 LAB — APTT: aPTT: 28 seconds (ref 24–36)

## 2016-02-08 NOTE — Pre-Procedure Instructions (Signed)
Spoke with Stephanie Ellis at Dr. Bunnie Domino office regarding pt having a cardiac cath on 02/13/16 and Dr. Kayleen Memos has requested a cardiac clearance for surgery on 02/15/16(clearance has been faxed to cardiologist at [facility])  and wanted to be sure Dr. Lucky Cowboy is OK to perform surgery on 02/15/16.

## 2016-02-08 NOTE — Pre-Procedure Instructions (Signed)
Message left on voice mail of Dr. Nada Libman related to cardiac clearance that was faxed to his office earlier today.

## 2016-02-08 NOTE — Pre-Procedure Instructions (Addendum)
Pt reported she has an appointment to see her cardiologist on 02/13/16 to evaluate her to see if her cardiac stent needs to be replaced.  This RN will fax a cardiac clearance to Dr. Bishop Dublin office to complete and fax back to PAT per Dr. Tad Moore request.

## 2016-02-08 NOTE — Patient Instructions (Signed)
  Your procedure is scheduled UO:[id number] Oct. 26 , 2017. Report to Same Day Surgery. To find out your arrival time please call [phone] between 1PM - 3PM on Wednesday Oct. 27, 2017.  Remember: Instructions that are not followed completely may result in serious medical risk, up to and including death, or upon the discretion of your surgeon and anesthesiologist your surgery may need to be rescheduled.    _x___ 1. Do not eat food or drink liquids after midnight. No gum chewing or hard candies.     ____ 2. No Alcohol for 24 hours before or after surgery.   ____ 3. Bring all medications with you on the day of surgery if instructed.    __x__ 4. Notify your doctor if there is any change in your medical condition     (cold, fever, infections).    _____ 5. No smoking 24 hours prior to surgery.     Do not wear jewelry, make-up, hairpins, clips or nail polish.  Do not wear lotions, powders, or perfumes.   Do not shave 48 hours prior to surgery. Men may shave face and neck.  Do not bring valuables to the hospital.    [facility] is not responsible for any belongings or valuables.               Contacts, dentures or bridgework may not be worn into surgery.  Leave your suitcase in the car. After surgery it may be brought to your room.  For patients admitted to the hospital, discharge time is determined by your treatment team.   Patients discharged the day of surgery will not be allowed to drive home.    Please read over the following fact sheets that you were given:   [facility] Preparing for Surgery  __x__ Take these medicines the morning of surgery with A SIP OF WATER:    1. metoprolol tartrate (LOPRESSOR)    ____ Fleet Enema (as directed)   _x___ Use SAGE wipes as directed on instruction sheet  ____ Use inhalers on the day of surgery and bring to hospital day of surgery  ____ Stop metformin 2 days prior to surgery    _x___ Take 1/2 of usual insulin dose the night before  surgery and none on the morning of surgery.   ____ Stop Coumadin/Plavix/aspirin on does not apply.  __x__ Stop Anti-inflammatories such as Advil, Aleve, Ibuprofen, Motrin, Naproxen,  Naprosyn, Goodies powders or aspirin products. OK to take Tylenol.   ____ Stop supplements until after surgery.    ____ Bring C-Pap to the hospital.

## 2016-02-08 NOTE — Pre-Procedure Instructions (Signed)
ECG 12 lead10/02/2016 [facility]  Rate - 65 Rhythm - regular PR - 134 QRS - 78 QTc - 469 LAD TWI in lead I No Q waves, ST changes Impression: Similar to prior EKG w/ new TWI in lead I

## 2016-02-10 DIAGNOSIS — I25709 Atherosclerosis of coronary artery bypass graft(s), unspecified, with unspecified angina pectoris: Secondary | ICD-10-CM | POA: Insufficient documentation

## 2016-02-13 ENCOUNTER — Telehealth (INDEPENDENT_AMBULATORY_CARE_PROVIDER_SITE_OTHER): Payer: Self-pay

## 2016-02-13 NOTE — Telephone Encounter (Signed)
Received a call from her friend stating the patient is still in the hospital and will not be getting out until maybe tomorrow, but her doctor told her to postpone this surgery for a couple of months due to her other health problems. She will call back when she is able to reschedule.

## 2016-02-15 ENCOUNTER — Ambulatory Visit: Admission: RE | Admit: 2016-02-15 | Payer: Medicare Other | Source: Ambulatory Visit | Admitting: Vascular Surgery

## 2016-02-15 ENCOUNTER — Encounter: Admission: RE | Payer: Self-pay | Source: Ambulatory Visit

## 2016-02-15 SURGERY — ARTERIOVENOUS (AV) FISTULA CREATION
Anesthesia: General | Laterality: Left

## 2016-02-24 DIAGNOSIS — Z22322 Carrier or suspected carrier of Methicillin resistant Staphylococcus aureus: Secondary | ICD-10-CM | POA: Insufficient documentation

## 2016-05-01 ENCOUNTER — Encounter (INDEPENDENT_AMBULATORY_CARE_PROVIDER_SITE_OTHER): Payer: Self-pay

## 2016-05-13 ENCOUNTER — Other Ambulatory Visit (INDEPENDENT_AMBULATORY_CARE_PROVIDER_SITE_OTHER): Payer: Self-pay | Admitting: Vascular Surgery

## 2016-05-23 ENCOUNTER — Inpatient Hospital Stay: Admission: RE | Admit: 2016-05-23 | Payer: Medicare Other | Source: Ambulatory Visit

## 2016-05-23 ENCOUNTER — Encounter
Admission: RE | Admit: 2016-05-23 | Discharge: 2016-05-23 | Disposition: A | Discharge status: Home / Self Care | Payer: Medicare Other | Source: Ambulatory Visit | Attending: Vascular Surgery | Admitting: Vascular Surgery

## 2016-05-23 HISTORY — DX: Adverse effect of unspecified anesthetic, initial encounter: T41.45XA

## 2016-05-23 HISTORY — PX: ARTERIOVENOUS GRAFT PLACEMENT: SUR1029

## 2016-05-23 HISTORY — DX: Acute myocardial infarction, unspecified: I21.9

## 2016-05-23 NOTE — Patient Instructions (Addendum)
  Your procedure is scheduled on: 05/30/16 Report to Day Surgery. [facility] SECOND FLOOR To find out your arrival time please call [phone] between 1PM - 3PM on 05/29/16  Remember: Instructions that are not followed completely may result in serious medical risk, up to and including death, or upon the discretion of your surgeon and anesthesiologist your surgery may need to be rescheduled.    _X___ 1. Do not eat food or drink liquids after midnight. No gum chewing or hard candies.     ____ 2. No Alcohol for 24 hours before or after surgery.   ____ 3. Do Not Smoke For 24 Hours Prior to Your Surgery.   ____ 4. Bring all medications with you on the day of surgery if instructed.    __X__ 5. Notify your doctor if there is any change in your medical condition     (cold, fever, infections).       Do not wear jewelry, make-up, hairpins, clips or nail polish.  Do not wear lotions, powders, or perfumes. You may wear deodorant.  Do not shave 48 hours prior to surgery. Men may shave face and neck.  Do not bring valuables to the hospital.    [facility] is not responsible for any belongings or valuables.               Contacts, dentures or bridgework may not be worn into surgery.  Leave your suitcase in the car. After surgery it may be brought to your room.  For patients admitted to the hospital, discharge time is determined by your                treatment team.   Patients discharged the day of surgery will not be allowed to drive home.     _X___ Take these medicines the morning of surgery with A SIP OF WATER:    1.ISOSORBIDE  2. HYDRALAZINE  3.METOPROLOL   4.  5.  6.  ____ Fleet Enema (as directed)   ____ Use CHG Soap as directed  ____ Use inhalers on the day of surgery  ____ Stop metformin 2 days prior to surgery    __X_ Take 1/2 of usual insulin dose the night before surgery and none on the morning of surgery.    NO INSULIN AM OF SURGERY  ____ Stop Coumadin/Plavix/aspirin  on     DO NOT TAKE ASPIRIN AND PLAVIX AM OF SURGERY  ____ Stop Anti-inflammatories on    ____ Stop supplements until after surgery.    ____ Bring C-Pap to the hospital.

## 2016-05-23 NOTE — Pre-Procedure Instructions (Signed)
PATIENT CALLED AND HAVING TRANSPORTATION PROBLEMS. ALSO DAUGHTER HAS THE FLU. SPOKE WITH DR Gildardo Griffes AND OK TO DO PHONE INTERVIEW. ALSO SPOKE WITH LAURA AT DR DEW'S AND OK TO DO LABS AM OF SURGERY. REQUEST FOR CARDIAC CLEARANCE,AS INSTRUCTED BY DR [place] FAXED TO DR Spero Geralds AT [facility].SPOKE WITH TANISHA THERE AND LAURA AT DR DEW'S. PATIENT AWARE

## 2016-05-29 MED ORDER — CEFAZOLIN SODIUM-DEXTROSE 2-4 GM/100ML-% IV SOLN
2.0000 g | INTRAVENOUS | Status: AC
  Administered 2016-05-30: 2 g via INTRAVENOUS

## 2016-05-30 ENCOUNTER — Ambulatory Visit: Payer: Medicare Other | Admitting: Certified Registered Nurse Anesthetist

## 2016-05-30 ENCOUNTER — Encounter
Admission: RE | Disposition: A | Discharge status: Home / Self Care | Payer: Self-pay | Source: Ambulatory Visit | Attending: Vascular Surgery

## 2016-05-30 ENCOUNTER — Ambulatory Visit
Admission: RE | Admit: 2016-05-30 | Discharge: 2016-05-30 | Disposition: A | Discharge status: Home / Self Care | Payer: Medicare Other | Source: Ambulatory Visit | Attending: Vascular Surgery | Admitting: Vascular Surgery

## 2016-05-30 DIAGNOSIS — E1122 Type 2 diabetes mellitus with diabetic chronic kidney disease: Secondary | ICD-10-CM | POA: Diagnosis present

## 2016-05-30 DIAGNOSIS — N186 End stage renal disease: Secondary | ICD-10-CM | POA: Diagnosis not present

## 2016-05-30 DIAGNOSIS — I132 Hypertensive heart and chronic kidney disease with heart failure and with stage 5 chronic kidney disease, or end stage renal disease: Secondary | ICD-10-CM | POA: Insufficient documentation

## 2016-05-30 DIAGNOSIS — I2510 Atherosclerotic heart disease of native coronary artery without angina pectoris: Secondary | ICD-10-CM | POA: Diagnosis not present

## 2016-05-30 DIAGNOSIS — Z7982 Long term (current) use of aspirin: Secondary | ICD-10-CM | POA: Insufficient documentation

## 2016-05-30 DIAGNOSIS — R011 Cardiac murmur, unspecified: Secondary | ICD-10-CM | POA: Diagnosis not present

## 2016-05-30 DIAGNOSIS — I252 Old myocardial infarction: Secondary | ICD-10-CM | POA: Insufficient documentation

## 2016-05-30 DIAGNOSIS — Z794 Long term (current) use of insulin: Secondary | ICD-10-CM | POA: Diagnosis not present

## 2016-05-30 DIAGNOSIS — E119 Type 2 diabetes mellitus without complications: Secondary | ICD-10-CM | POA: Diagnosis not present

## 2016-05-30 DIAGNOSIS — Z9889 Other specified postprocedural states: Secondary | ICD-10-CM | POA: Insufficient documentation

## 2016-05-30 DIAGNOSIS — I509 Heart failure, unspecified: Secondary | ICD-10-CM | POA: Insufficient documentation

## 2016-05-30 DIAGNOSIS — Z951 Presence of aortocoronary bypass graft: Secondary | ICD-10-CM | POA: Diagnosis not present

## 2016-05-30 DIAGNOSIS — Z888 Allergy status to other drugs, medicaments and biological substances status: Secondary | ICD-10-CM | POA: Insufficient documentation

## 2016-05-30 DIAGNOSIS — I10 Essential (primary) hypertension: Secondary | ICD-10-CM | POA: Diagnosis not present

## 2016-05-30 HISTORY — PX: AV FISTULA PLACEMENT: SHX1204

## 2016-05-30 LAB — CBC WITH DIFFERENTIAL/PLATELET
Basophils Absolute: 0.1 10*3/uL (ref 0–0.1)
Basophils Relative: 1 %
Eosinophils Absolute: 0.3 10*3/uL (ref 0–0.7)
Eosinophils Relative: 3 %
HCT: 33.5 % — ABNORMAL LOW (ref 35.0–47.0)
Hemoglobin: 11.1 g/dL — ABNORMAL LOW (ref 12.0–16.0)
Lymphocytes Relative: 15 %
Lymphs Abs: 1.4 10*3/uL (ref 1.0–3.6)
MCH: 32.3 pg (ref 26.0–34.0)
MCHC: 33.1 g/dL (ref 32.0–36.0)
MCV: 97.3 fL (ref 80.0–100.0)
Monocytes Absolute: 0.8 10*3/uL (ref 0.2–0.9)
Monocytes Relative: 8 %
Neutro Abs: 6.9 10*3/uL — ABNORMAL HIGH (ref 1.4–6.5)
Neutrophils Relative %: 73 %
Platelets: 406 10*3/uL (ref 150–440)
RBC: 3.44 MIL/uL — ABNORMAL LOW (ref 3.80–5.20)
RDW: 16.9 % — ABNORMAL HIGH (ref 11.5–14.5)
WBC: 9.4 10*3/uL (ref 3.6–11.0)

## 2016-05-30 LAB — BASIC METABOLIC PANEL
Anion gap: 11 (ref 5–15)
BUN: 30 mg/dL — ABNORMAL HIGH (ref 6–20)
CO2: 27 mmol/L (ref 22–32)
Calcium: 8.5 mg/dL — ABNORMAL LOW (ref 8.9–10.3)
Chloride: 97 mmol/L — ABNORMAL LOW (ref 101–111)
Creatinine, Ser: 4.95 mg/dL — ABNORMAL HIGH (ref 0.44–1.00)
GFR calc Af Amer: 10 mL/min — ABNORMAL LOW (ref >60)
GFR calc non Af Amer: 8 mL/min — ABNORMAL LOW (ref >60)
Glucose, Bld: 138 mg/dL — ABNORMAL HIGH (ref 65–99)
Potassium: 4.4 mmol/L (ref 3.5–5.1)
Sodium: 135 mmol/L (ref 135–145)

## 2016-05-30 LAB — PROTIME-INR
INR: 0.95
Prothrombin Time: 12.7 seconds (ref 11.4–15.2)

## 2016-05-30 LAB — TYPE AND SCREEN
ABO/RH(D): B POS
Antibody Screen: NEGATIVE

## 2016-05-30 LAB — GLUCOSE, CAPILLARY
Glucose-Capillary: 121 mg/dL — ABNORMAL HIGH (ref 65–99)
Glucose-Capillary: 112 mg/dL — ABNORMAL HIGH (ref 65–99)

## 2016-05-30 LAB — SURGICAL PCR SCREEN
MRSA, PCR: NEGATIVE
Staphylococcus aureus: NEGATIVE

## 2016-05-30 LAB — APTT: aPTT: 28 seconds (ref 24–36)

## 2016-05-30 SURGERY — INSERTION OF ARTERIOVENOUS (AV) GORE-TEX GRAFT ARM
Anesthesia: General | Laterality: Left | Wound class: Clean

## 2016-05-30 MED ORDER — LIDOCAINE HCL (CARDIAC) 20 MG/ML IV SOLN
INTRAVENOUS | Status: DC | PRN
  Administered 2016-05-30: 80 mg via INTRAVENOUS

## 2016-05-30 MED ORDER — BUPIVACAINE-EPINEPHRINE (PF) 0.5% -1:200000 IJ SOLN
INTRAMUSCULAR | Status: AC
  Filled 2016-05-30: qty 30

## 2016-05-30 MED ORDER — FAMOTIDINE 20 MG PO TABS
ORAL_TABLET | ORAL | Status: AC
  Administered 2016-05-30: 20 mg via ORAL
  Filled 2016-05-30: qty 1

## 2016-05-30 MED ORDER — LIDOCAINE HCL (PF) 2 % IJ SOLN
INTRAMUSCULAR | Status: AC
  Filled 2016-05-30: qty 2

## 2016-05-30 MED ORDER — ONDANSETRON HCL 4 MG/2ML IJ SOLN
INTRAMUSCULAR | Status: AC
  Filled 2016-05-30: qty 2

## 2016-05-30 MED ORDER — PROPOFOL 10 MG/ML IV BOLUS
INTRAVENOUS | Status: AC
  Filled 2016-05-30: qty 20

## 2016-05-30 MED ORDER — ACETAMINOPHEN 10 MG/ML IV SOLN
INTRAVENOUS | Status: DC | PRN
  Administered 2016-05-30: 1000 mg via INTRAVENOUS

## 2016-05-30 MED ORDER — CHLORHEXIDINE GLUCONATE CLOTH 2 % EX PADS
6.0000 | MEDICATED_PAD | Freq: Once | CUTANEOUS | Status: AC
  Administered 2016-05-30: 6 via TOPICAL

## 2016-05-30 MED ORDER — ONDANSETRON HCL 4 MG/2ML IJ SOLN
INTRAMUSCULAR | Status: DC | PRN
  Administered 2016-05-30: 4 mg via INTRAVENOUS

## 2016-05-30 MED ORDER — CEFAZOLIN SODIUM-DEXTROSE 2-4 GM/100ML-% IV SOLN
INTRAVENOUS | Status: AC
  Administered 2016-05-30: 2 g via INTRAVENOUS
  Filled 2016-05-30: qty 100

## 2016-05-30 MED ORDER — PROPOFOL 10 MG/ML IV BOLUS
INTRAVENOUS | Status: DC | PRN
  Administered 2016-05-30: 20 mg via INTRAVENOUS
  Administered 2016-05-30: 30 mg via INTRAVENOUS
  Administered 2016-05-30: 50 mg via INTRAVENOUS

## 2016-05-30 MED ORDER — BUPIVACAINE-EPINEPHRINE (PF) 0.5% -1:200000 IJ SOLN
INTRAMUSCULAR | Status: DC | PRN
  Administered 2016-05-30: 15 mL

## 2016-05-30 MED ORDER — SODIUM CHLORIDE 0.9 % IV SOLN
INTRAVENOUS | Status: DC
  Administered 2016-05-30: 11:00:00 via INTRAVENOUS

## 2016-05-30 MED ORDER — ONDANSETRON HCL 4 MG/2ML IJ SOLN
4.0000 mg | Freq: Once | INTRAMUSCULAR | Status: AC | PRN
  Administered 2016-05-30: 4 mg via INTRAVENOUS

## 2016-05-30 MED ORDER — HEPARIN SODIUM (PORCINE) 1000 UNIT/ML IJ SOLN
INTRAMUSCULAR | Status: DC | PRN
  Administered 2016-05-30: 3000 [IU] via INTRAVENOUS

## 2016-05-30 MED ORDER — SODIUM CHLORIDE 0.9 % IV SOLN
INTRAVENOUS | Status: DC | PRN
  Administered 2016-05-30: 40 ug/min via INTRAVENOUS

## 2016-05-30 MED ORDER — PHENYLEPHRINE 40 MCG/ML (10ML) SYRINGE FOR IV PUSH (FOR BLOOD PRESSURE SUPPORT)
PREFILLED_SYRINGE | INTRAVENOUS | Status: AC
  Filled 2016-05-30: qty 10

## 2016-05-30 MED ORDER — HYDROCODONE-ACETAMINOPHEN 5-325 MG PO TABS: 1.0000 | ORAL_TABLET | Freq: Four times a day (QID) | ORAL | 0 refills | Status: DC | PRN

## 2016-05-30 MED ORDER — PAPAVERINE HCL 30 MG/ML IJ SOLN
INTRAMUSCULAR | Status: AC
  Filled 2016-05-30: qty 2

## 2016-05-30 MED ORDER — FAMOTIDINE 20 MG PO TABS
20.0000 mg | ORAL_TABLET | Freq: Once | ORAL | Status: AC
  Administered 2016-05-30: 20 mg via ORAL

## 2016-05-30 MED ORDER — HEPARIN SODIUM (PORCINE) 5000 UNIT/ML IJ SOLN
INTRAMUSCULAR | Status: AC
  Filled 2016-05-30: qty 1

## 2016-05-30 MED ORDER — EVICEL 2 ML EX KIT
PACK | CUTANEOUS | Status: AC
  Filled 2016-05-30: qty 1

## 2016-05-30 MED ORDER — CHLORHEXIDINE GLUCONATE CLOTH 2 % EX PADS: 6.0000 | MEDICATED_PAD | Freq: Once | CUTANEOUS | Status: DC

## 2016-05-30 MED ORDER — HEPARIN SODIUM (PORCINE) 1000 UNIT/ML IJ SOLN
INTRAMUSCULAR | Status: AC
  Filled 2016-05-30: qty 1

## 2016-05-30 MED ORDER — ACETAMINOPHEN 10 MG/ML IV SOLN
INTRAVENOUS | Status: AC
  Filled 2016-05-30: qty 100

## 2016-05-30 SURGICAL SUPPLY — 53 items
BAG DECANTER FOR FLEXI CONT (MISCELLANEOUS) ×2 IMPLANT
BLADE SURG SZ11 CARB STEEL (BLADE) ×2 IMPLANT
BOOT SUTURE AID YELLOW STND (SUTURE) ×2 IMPLANT
BRUSH SCRUB 4% CHG (MISCELLANEOUS) ×2 IMPLANT
CANISTER SUCT 1200ML W/VALVE (MISCELLANEOUS) ×2 IMPLANT
CHLORAPREP W/TINT 26ML (MISCELLANEOUS) ×2 IMPLANT
CLIP SPRNG 6MM S-JAW DBL (CLIP) ×2
DERMABOND ADVANCED (GAUZE/BANDAGES/DRESSINGS) ×1
DERMABOND ADVANCED .7 DNX12 (GAUZE/BANDAGES/DRESSINGS) ×1 IMPLANT
ELECT CAUTERY BLADE 6.4 (BLADE) ×2 IMPLANT
ELECT REM PT RETURN 9FT ADLT (ELECTROSURGICAL) ×2
ELECTRODE REM PT RTRN 9FT ADLT (ELECTROSURGICAL) ×1 IMPLANT
EVICEL 2ML SEALANT HUMAN (Miscellaneous) ×2 IMPLANT
GEL ULTRASOUND 20GR AQUASONIC (MISCELLANEOUS) IMPLANT
GLOVE BIO SURGEON STRL SZ7 (GLOVE) ×4 IMPLANT
GLOVE INDICATOR 7.5 STRL GRN (GLOVE) ×2 IMPLANT
GOWN STRL REUS W/ TWL LRG LVL3 (GOWN DISPOSABLE) ×1 IMPLANT
GOWN STRL REUS W/ TWL XL LVL3 (GOWN DISPOSABLE) ×1 IMPLANT
GOWN STRL REUS W/TWL LRG LVL3 (GOWN DISPOSABLE) ×1
GOWN STRL REUS W/TWL XL LVL3 (GOWN DISPOSABLE) ×1
GRAFT PROPATEN STD WALL 6X40 (Vascular Products) ×2 IMPLANT
HEMOSTAT SURGICEL 2X3 (HEMOSTASIS) ×2 IMPLANT
IV NS 500ML (IV SOLUTION) ×1
IV NS 500ML BAXH (IV SOLUTION) ×1 IMPLANT
KIT RM TURNOVER STRD PROC AR (KITS) ×2 IMPLANT
LABEL OR SOLS (LABEL) ×2 IMPLANT
LOOP RED MAXI  1X406MM (MISCELLANEOUS) ×1
LOOP VESSEL MAXI 1X406 RED (MISCELLANEOUS) ×1 IMPLANT
LOOP VESSEL MINI 0.8X406 BLUE (MISCELLANEOUS) ×1 IMPLANT
LOOPS BLUE MINI 0.8X406MM (MISCELLANEOUS) ×1
NEEDLE FILTER BLUNT 18X 1/2SAF (NEEDLE) ×1
NEEDLE FILTER BLUNT 18X1 1/2 (NEEDLE) ×1 IMPLANT
NEEDLE HYPO 30X.5 LL (NEEDLE) IMPLANT
NS IRRIG 500ML POUR BTL (IV SOLUTION) ×2 IMPLANT
PACK EXTREMITY ARMC (MISCELLANEOUS) ×2 IMPLANT
PAD PREP 24X41 OB/GYN DISP (PERSONAL CARE ITEMS) IMPLANT
SOLUTION CELL SAVER (CLIP) ×1 IMPLANT
STOCKINETTE STRL 4IN 9604848 (GAUZE/BANDAGES/DRESSINGS) ×2 IMPLANT
SUT MNCRL AB 4-0 PS2 18 (SUTURE) ×2 IMPLANT
SUT PROLENE 6 0 BV (SUTURE) ×8 IMPLANT
SUT SILK 2 0 (SUTURE) ×1
SUT SILK 2 0 SH (SUTURE) ×2 IMPLANT
SUT SILK 2-0 18XBRD TIE 12 (SUTURE) ×1 IMPLANT
SUT SILK 3 0 (SUTURE) ×1
SUT SILK 3-0 18XBRD TIE 12 (SUTURE) ×1 IMPLANT
SUT SILK 4 0 (SUTURE) ×1
SUT SILK 4-0 18XBRD TIE 12 (SUTURE) ×1 IMPLANT
SUT VIC AB 3-0 SH 27 (SUTURE) ×2
SUT VIC AB 3-0 SH 27X BRD (SUTURE) ×2 IMPLANT
SYR 20CC LL (SYRINGE) ×2 IMPLANT
SYR 3ML LL SCALE MARK (SYRINGE) ×2 IMPLANT
SYR TB 1ML 27GX1/2 LL (SYRINGE) IMPLANT
TOWEL OR 17X26 4PK STRL BLUE (TOWEL DISPOSABLE) IMPLANT

## 2016-05-30 NOTE — Op Note (Signed)
OPERATIVE NOTE   PROCEDURE:  left forearm loop arteriovenous graft with  6 mm pTFE  PRE-OPERATIVE DIAGNOSIS: 1. ESRD   POST-OPERATIVE DIAGNOSIS: same as above  SURGEON: Leotis Pain, MD  ASSISTANT(S): None  ANESTHESIA: Gen.  ESTIMATED BLOOD LOSS: 50 cc  FINDING(S): 1. none  SPECIMEN(S):  none  INDICATIONS:   Stephanie Ellis is a 66 y.o. female who  presents with ESRD and need for permanent dialysis access.  Risk, benefits, and alternatives to access surgery were discussed.  The difference between AV fistulas and AV grafts were discussed.  The patient is aware the risks include but are not limited to: bleeding, infection, steal syndrome, nerve damage, ischemic monomelic neuropathy, failure to mature, and need for additional procedures.  The patient is aware of the risks and elects to proceed forward.  DESCRIPTION: After full informed written consent was obtained from the patient, the patient was brought back to the operating room and placed supine upon the operating table.  The patientwas given IV antibiotics prior to proceeding.  After obtaining adequate general anesthesia, the patient was prepped and draped in standard fashion.    I made a transverse incision at the antecubital fossa and dissected down through the subcutaneous tissue and fascia.  The brachial artery was dissected proximally and distally and vessel loops placed for control.  The artery was patent and adequate sized for AV graft creation. Based off of the preoperative vein mapping, the cephalic vein was expected to be small and not usable for fistula creation. This was the case. The basilic vein on preoperative vein mapping was felt to be likely usable for fistula creation. On exploration, the basilic vein was small and not adequate for fistula creation. One of the brachial veins was in close proximity and was found to be patent and of adequate size for AV graft creation.  I dissected it out and placed a vessel loop around  the vein, and would later control this with bulldog clamps.  I then obtained a 6 mm PTFE graft, which I stretched to full length to help determine the apex of this looped forearm arteriovenous graft.  I made a transverse incision at the determined apex site and dissected down out a pocket.  I then took a curved metal tunneler and dissected from the antecubital incision up to the apical incision.  I delivered the graft through the metal tunnel, taking care to maintain the orientation of the graft.  I then tunneled from the apex to the antecubital incision, leaving the metal tunnel in place and delivered the remainder of the graft through the tunnel.   I then gave the patient 3000 units of heparin to gain some anticoagulation, and allowed this to circulate for several minutes. I placed the brachial artery under tension proximally and distally with vessel loops.  I made an arteriotomy with a 11 blade and extended it with a Potts scissor for.  The graft was then cut and beveled to match the arteriotomy.  The graft was sewn to the artery with a running CV-6 Goretex suture, in a end-to-side configuration.  Prior to completing the anastomosis, I allowed the artery to backbleed from both ends.  I then  released the vessel loops on the inflow and allowed the artery to decompress through the graft. There was good pulsatile bleeding through this graft.  I then clamped the graft near its arterial anastomosis.  I then suctioned out all the blood in the graft and instilled heparinized saline into the graft.  At this point, I pulled the graft to appropriate tension to remove any redundancy.  I then used the bulldog clamps to control the vein.  An anterior wall venotomy was then made with an 11 blade and extended with Potts scissors.  The graft was then cut an beveled to match the venotomy.  The venous anastomosis was created with a CV-6 suture. Prior to completing this anastomosis, I allowed the vein to back bleed and then I also  allowed the artery to bleed in an antegrade fashion.  I completed this anastomosis in the usual fashion, and irrigated out the wound with sterile saline.  I released all clamps.  There was excellent flow in the graft, and a palpable pulse in the artery beyond the graft. At this point, I washed out the antecubital wound.  I placed Surgicel and Evicel topical hemostatic agents and hemostasis was complete. The subcutaneous tissue was reapproximated in all incisions with a running stitch of 3-0 Vicryl.  The skin was then reapproximated in all incisions with a running subcuticular 4-0 Monocryl.  The skin was then cleaned and dried at all incisions, and Dermabond used to reinforce the skin closure.  The patient was taken to the recovery room in stable condition having tolerated the procedure well.  COMPLICATIONS: none  CONDITION: stable  Leotis Pain  05/30/2016, 2:31 PM                  This note was created with Dragon Medical transcription system. Any errors in dictation are purely unintentional.

## 2016-05-30 NOTE — H&P (Signed)
[facility] Admission History & Physical  MRN : [id number]  Stephanie Ellis is a 66 y.o. (02-08-1951) female who presents with chief complaint of No chief complaint on file. Marland Kitchen  History of Present Illness: Patient presents for her AVF creation.  Likely brachiobasilic AVF. No complaints today.  Gets HD on M/W/F. No fever or chills.  No previous access placements.  Current Facility-Administered Medications  Medication Dose Route Frequency Provider Last Rate Last Dose  . 0.9 %  sodium chloride infusion   Intravenous Continuous Gunnar Bulla, MD 50 mL/hr at 05/30/16 1118    . ceFAZolin (ANCEF) 2-4 GM/100ML-% IVPB           . ceFAZolin (ANCEF) IVPB 2g/100 mL premix  2 g Intravenous On Call to [place], PA-C      . Chlorhexidine Gluconate Cloth 2 % PADS 6 each  6 each Topical Once Sela Hua, PA-C        Past Medical History:  Diagnosis Date  . Anemia   . Anorexia   . CHF (congestive heart failure) ([place])   . Complication of anesthesia    WITH LAST STENT 10/17. PASED OUT AND HAD TO BE AWAKENED  . Coronary artery disease involving left main coronary artery 01/2015   [facility]: 70% LM, p-mLAD 50-60% (Resting FFR 0.75), mRCA 80-90%, ~40 Ost OM & D1  . Dysrhythmia   . ESRD (end stage renal disease) on dialysis [facility])    ESRD secondary to acute kidney failure s/p CABG  DIALYSIS M/W/F  . Essential hypertension   . Heart murmur   . Myocardial infarction   . 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 Marland Kitchen Haithcock: CABG x 3, LIMA to LAD, SVG to RCA, SVG to OM3, EVH  . Type II diabetes mellitus with complication (HCC)    CAD    Past Surgical History:  Procedure Laterality Date  . ABDOMINAL HYSTERECTOMY    . CARDIAC CATHETERIZATION  01/2015   [facility]: Ost LM 70%, p-m LAD 50-60% (Rest FFR + @ 0.75), mRCA 80-90%, ostD1 40%, pOM1 40%  . CATARACT EXTRACTION W/PHACO Left 01/18/2016   Procedure: CATARACT EXTRACTION PHACO AND INTRAOCULAR LENS PLACEMENT (IOC);   Surgeon: Eulogio Bear, MD;  Location: [facility] ORS;  Service: Ophthalmology;  Laterality: Left;  [place] 1.05 AP% 15.5 CDE 10.16 Fluid Pack Lot # [id number]  . CORONARY ANGIOPLASTY     SENTS 02/12/16  . CORONARY ARTERY BYPASS GRAFT  03/28/15    [facility]: Dr. Waldemar Dickens: LIMA to LAD, SVG to RCA, SVG to OM3, EVH  . EYE SURGERY    . INSERTION OF DIALYSIS CATHETER    . TRANSTHORACIC ECHOCARDIOGRAM  January 2017    EF 60-65%. GR 2 DD. Mild degenerative mitral valve disease but no prolapse or regurgitation. Mild left atrial dilation. Mild to moderate LVH. Pericardial effusion gone    Social History Social History  Substance Use Topics  . Smoking status: Never Smoker  . Smokeless tobacco: Never Used  . Alcohol use No    Family History Family History  Problem Relation Age of Onset  . Diabetes Mellitus II Mother   No bleeding disorders, clotting disorders, or aneurysms  Allergies  Allergen Reactions  . Chlorthalidone Anaphylaxis, Itching and Rash  . Fentanyl Other (See Comments)    Severe hypotension  . Midazolam Other (See Comments)    Severe hypotension  . Ace Inhibitors Other (See Comments)    Reaction:  Hyperkalemia   . Angiotensin  Receptor Blockers Other (See Comments)    Reaction:  Hyperkalemia   . Norvasc [Amlodipine] Itching and Rash     REVIEW OF SYSTEMS (Negative unless checked)  Constitutional: [] Weight loss  [] Fever  [] Chills Cardiac: [] Chest pain   [] Chest pressure   [] Palpitations   [] Shortness of breath when laying flat   [] Shortness of breath at rest   [] Shortness of breath with exertion. Vascular:  [] Pain in legs with walking   [] Pain in legs at rest   [] Pain in legs when laying flat   [] Claudication   [] Pain in feet when walking  [] Pain in feet at rest  [] Pain in feet when laying flat   [] History of DVT   [] Phlebitis   [x] Swelling in legs   [] Varicose veins   [] Non-healing ulcers Pulmonary:   [] Uses home oxygen   [] Productive cough   [] Hemoptysis   [] Wheeze  [] COPD    [] Asthma Neurologic:  [] Dizziness  [] Blackouts   [] Seizures   [] History of stroke   [] History of TIA  [] Aphasia   [] Temporary blindness   [] Dysphagia   [] Weakness or numbness in arms   [] Weakness or numbness in legs Musculoskeletal:  [x] Arthritis   [] Joint swelling   [] Joint pain   [] Low back pain Hematologic:  [] Easy bruising  [] Easy bleeding   [] Hypercoagulable state   [] Anemic  [] Hepatitis Gastrointestinal:  [] Blood in stool   [] Vomiting blood  [] Gastroesophageal reflux/heartburn   [] Difficulty swallowing. Genitourinary:  [x] Chronic kidney disease   [] Difficult urination  [] Frequent urination  [] Burning with urination   [] Blood in urine Skin:  [] Rashes   [] Ulcers   [] Wounds Psychological:  [] History of anxiety   []  History of major depression.  Physical Examination  Vitals:   05/30/16 1043  BP: (!) 93/44  Pulse: 64  Resp: 20  Temp: 97.9 F (36.6 C)  TempSrc: Oral  SpO2: 99%   There is no height or weight on file to calculate BMI. Gen: WD/WN, NAD Head: [place]/AT, No temporalis wasting. Prominent temp pulse not noted. Ear/Nose/Throat: Hearing grossly intact, nares w/o erythema or drainage, oropharynx w/o Erythema/Exudate,  Eyes: Conjunctiva clear, sclera non-icteric Neck: Trachea midline.  No JVD.  Pulmonary:  Good air movement, respirations not labored, no use of accessory muscles.  Cardiac: RRR, normal S1, S2. Vascular: permcath in right chest Vessel Right Left  Radial Palpable Palpable                                   Gastrointestinal: soft, non-tender/non-distended. No guarding/reflex.  Musculoskeletal: M/S 5/5 throughout.  Extremities without ischemic changes.  No deformity or atrophy.  Neurologic: Sensation grossly intact in extremities.  Symmetrical.  Speech is fluent. Motor exam as listed above. Psychiatric: Judgment intact, Mood & affect appropriate for pt's clinical situation. Dermatologic: No rashes or ulcers noted.  No cellulitis or open wounds. Lymph : No  Cervical, Axillary, or Inguinal lymphadenopathy.     CBC Lab Results  Component Value Date   WBC 9.4 05/30/2016   HGB 11.1 (L) 05/30/2016   HCT 33.5 (L) 05/30/2016   MCV 97.3 05/30/2016   PLT 406 05/30/2016    BMET    Component Value Date/Time   NA 135 05/30/2016 1054   K 4.4 05/30/2016 1054   CL 97 (L) 05/30/2016 1054   CO2 27 05/30/2016 1054   GLUCOSE 138 (H) 05/30/2016 1054   BUN 30 (H) 05/30/2016 1054   CREATININE 4.95 (H) 05/30/2016 1054  CALCIUM 8.5 (L) 05/30/2016 1054   GFRNONAA 8 (L) 05/30/2016 1054   GFRAA 10 (L) 05/30/2016 1054   Estimated Creatinine Clearance: 9.8 mL/min (by C-G formula based on SCr of 4.95 mg/dL (H)).  COAG Lab Results  Component Value Date   INR 0.95 05/30/2016   INR 0.99 02/08/2016    Radiology No results found.    Assessment/Plan 1. ESRD.  For left arm AVf today.  Risks and benefits discussed 2. DM. Stable on outpatient medications. blood glucose control important in reducing the progression of atherosclerotic disease. Also, involved in wound healing. On appropriate medications. 3. HTN. Stable on outpatient medications. blood pressure control important in reducing the progression of atherosclerotic disease. On appropriate oral medications.    Leotis Pain, MD  05/30/2016 11:47 AM

## 2016-05-30 NOTE — Discharge Instructions (Signed)

## 2016-05-30 NOTE — OR Nursing (Signed)
3000 units of Heparin given by anesthesia @ 1323

## 2016-05-30 NOTE — Transfer of Care (Addendum)
Immediate Anesthesia Transfer of Care Note  Patient: Stephanie Ellis  Procedure(s) Performed: Procedure(s): ARTERIOVENOUS graft (Left)  Patient Location: PACU  Anesthesia Type:General  Level of Consciousness: awake, alert , oriented and patient cooperative  Airway & Oxygen Therapy: Patient Spontanous Breathing and Patient connected to nasal cannula oxygen  Post-op Assessment: Report given to RN, Post -op Vital signs reviewed and stable and Patient moving all extremities  Post vital signs: Reviewed and stable  Last Vitals:  Vitals:   05/30/16 1043 05/30/16 1428  BP: (!) 93/44 (!) (P) 124/45  Pulse: 64 (P) 64  Resp: 20 (!) (P) 23  Temp: 36.6 C (P) 36.4 C    Last Pain:  Vitals:   05/30/16 1043  TempSrc: Oral         Complications: No apparent anesthesia complications

## 2016-05-30 NOTE — Anesthesia Post-op Follow-up Note (Cosign Needed)
Anesthesia QCDR form completed.        

## 2016-05-30 NOTE — Anesthesia Preprocedure Evaluation (Addendum)
Anesthesia Evaluation  Patient identified by MRN, date of birth, ID band Patient awake    Reviewed: Allergy & Precautions, H&P , NPO status , Patient's Chart, lab work & pertinent test results, reviewed documented beta blocker date and time   History of Anesthesia Complications (+) PONV and history of anesthetic complications  Airway Mallampati: II  TM Distance: >3 FB Neck ROM: full    Dental   Pulmonary neg pulmonary ROS,    Pulmonary exam normal        Cardiovascular Exercise Tolerance: Poor hypertension, On Medications + CAD, + Past MI and +CHF  (-) Orthopnea, (-) PND and (-) DOE negative cardio ROS Normal cardiovascular exam+ dysrhythmias + Valvular Problems/Murmurs  Rate:Normal  Mi 2 months ago.  Stable now with Cards clearance. On plavix with recent stents. JA   Neuro/Psych negative neurological ROS  negative psych ROS   GI/Hepatic negative GI ROS, Neg liver ROS,   Endo/Other  negative endocrine ROSdiabetes, Poorly Controlled  Renal/GU ESRF and DialysisRenal diseasenegative Renal ROS  negative genitourinary   Musculoskeletal   Abdominal   Peds  Hematology negative hematology ROS (+) anemia ,   Anesthesia Other Findings   Reproductive/Obstetrics negative OB ROS                            Anesthesia Physical Anesthesia Plan  ASA: IV  Anesthesia Plan: General LMA   Post-op Pain Management:    Induction:   Airway Management Planned:   Additional Equipment:   Intra-op Plan:   Post-operative Plan:   Informed Consent: I have reviewed the patients History and Physical, chart, labs and discussed the procedure including the risks, benefits and alternatives for the proposed anesthesia with the patient or authorized representative who has indicated his/her understanding and acceptance.     Plan Discussed with: CRNA  Anesthesia Plan Comments: (Pt at very high risk based on  underlying overall medical status but appears stable at present and reportedly acceptable status and risk profile per cardiology.  Will plan on GA for above.  Do not feel RA appropriate under circumstances.Marland KitchenJA)        Anesthesia Quick Evaluation

## 2016-05-31 ENCOUNTER — Encounter: Payer: Self-pay | Admitting: Vascular Surgery

## 2016-06-01 NOTE — Anesthesia Postprocedure Evaluation (Signed)
Anesthesia Post Note  Patient: Stephanie Ellis  Procedure(s) Performed: Procedure(s) (LRB): ARTERIOVENOUS graft (Left)  Patient location during evaluation: PACU Anesthesia Type: General Level of consciousness: awake and alert Pain management: pain level controlled Vital Signs Assessment: post-procedure vital signs reviewed and stable Respiratory status: spontaneous breathing, nonlabored ventilation, respiratory function stable and patient connected to nasal cannula oxygen Cardiovascular status: blood pressure returned to baseline and stable Postop Assessment: no signs of nausea or vomiting Anesthetic complications: no     Last Vitals:  Vitals:   05/30/16 1536 05/30/16 1605  BP: (!) 93/42 (!) 107/47  Pulse: 66 63  Resp: 15 16  Temp:      Last Pain:  Vitals:   05/31/16 0838  TempSrc:   PainSc: 0-No pain                 Molli Barrows

## 2016-06-10 ENCOUNTER — Encounter: Payer: Self-pay | Admitting: Vascular Surgery

## 2016-06-13 ENCOUNTER — Encounter (INDEPENDENT_AMBULATORY_CARE_PROVIDER_SITE_OTHER): Payer: Medicare Other | Admitting: Vascular Surgery

## 2016-06-18 ENCOUNTER — Ambulatory Visit (INDEPENDENT_AMBULATORY_CARE_PROVIDER_SITE_OTHER): Payer: Medicare Other | Admitting: Vascular Surgery

## 2016-06-18 ENCOUNTER — Encounter (INDEPENDENT_AMBULATORY_CARE_PROVIDER_SITE_OTHER): Payer: Self-pay

## 2016-06-18 ENCOUNTER — Encounter (INDEPENDENT_AMBULATORY_CARE_PROVIDER_SITE_OTHER): Payer: Self-pay | Admitting: Vascular Surgery

## 2016-06-18 VITALS — BP 87/44 | HR 85 | Resp 16 | Ht 64.0 in | Wt 128.0 lb

## 2016-06-18 DIAGNOSIS — E118 Type 2 diabetes mellitus with unspecified complications: Secondary | ICD-10-CM

## 2016-06-18 DIAGNOSIS — Z794 Long term (current) use of insulin: Secondary | ICD-10-CM

## 2016-06-18 DIAGNOSIS — Z992 Dependence on renal dialysis: Secondary | ICD-10-CM

## 2016-06-18 DIAGNOSIS — N186 End stage renal disease: Secondary | ICD-10-CM

## 2016-06-18 DIAGNOSIS — I10 Essential (primary) hypertension: Secondary | ICD-10-CM

## 2016-06-18 DIAGNOSIS — T82898A Other specified complication of vascular prosthetic devices, implants and grafts, initial encounter: Secondary | ICD-10-CM

## 2016-06-18 NOTE — Progress Notes (Signed)
Patient ID: Stephanie Ellis, female   DOB: [birth year], 66 y.o.   MRN: [id number]  Chief Complaint  Patient presents with  . Follow-up    HPI Stephanie Ellis is a 66 y.o. female.  Patient returns in follow-up. She has been having pain and numbness in her left hand since her left forearm loop AV graft that was placed about 2-3 weeks ago. Her incisions are well-healed. She has a good bruit and a soft thrill in the graft. She has not yet used the graft.   Past Medical History:  Diagnosis Date  . Anemia   . Anorexia   . CHF (congestive heart failure) ([place])   . Complication of anesthesia    WITH LAST STENT 10/17. PASED OUT AND HAD TO BE AWAKENED  . Coronary artery disease involving left main coronary artery 01/2015   [facility]: 70% LM, p-mLAD 50-60% (Resting FFR 0.75), mRCA 80-90%, ~40 Ost OM & D1  . Dysrhythmia   . ESRD (end stage renal disease) on dialysis [facility])    ESRD secondary to acute kidney failure s/p CABG  DIALYSIS M/W/F  . Essential hypertension   . Heart murmur   . Myocardial infarction   . 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 Marland Kitchen Haithcock: CABG x 3, LIMA to LAD, SVG to RCA, SVG to OM3, EVH  . Type II diabetes mellitus with complication (HCC)    CAD    Past Surgical History:  Procedure Laterality Date  . ABDOMINAL HYSTERECTOMY    . AV FISTULA PLACEMENT Left 05/30/2016   Procedure: ARTERIOVENOUS graft;  Surgeon: Algernon Huxley, MD;  Location: [facility] ORS;  Service: Vascular;  Laterality: Left;  . CARDIAC CATHETERIZATION  01/2015   [facility]: Ost LM 70%, p-m LAD 50-60% (Rest FFR + @ 0.75), mRCA 80-90%, ostD1 40%, pOM1 40%  . CATARACT EXTRACTION W/PHACO Left 01/18/2016   Procedure: CATARACT EXTRACTION PHACO AND INTRAOCULAR LENS PLACEMENT (IOC);  Surgeon: Eulogio Bear, MD;  Location: [facility] ORS;  Service: Ophthalmology;  Laterality: Left;  [place] 1.05 AP% 15.5 CDE 10.16 Fluid Pack Lot # [id number]  . CORONARY ANGIOPLASTY     SENTS 02/12/16  . CORONARY ARTERY BYPASS GRAFT  03/28/15   [facility]: Dr. Waldemar Dickens: LIMA to LAD, SVG to RCA, SVG to OM3, EVH  . EYE SURGERY    . INSERTION OF DIALYSIS CATHETER    . TRANSTHORACIC ECHOCARDIOGRAM  January 2017    EF 60-65%. GR 2 DD. Mild degenerative mitral valve disease but no prolapse or regurgitation. Mild left atrial dilation. Mild to moderate LVH. Pericardial effusion gone      Allergies  Allergen Reactions  . Chlorthalidone Anaphylaxis, Itching and Rash  . Fentanyl Other (See Comments)    Severe hypotension  . Midazolam Other (See Comments)    Severe hypotension  . Ace Inhibitors Other (See Comments)    Reaction:  Hyperkalemia   . Angiotensin Receptor Blockers Other (See Comments)    Reaction:  Hyperkalemia   . Norvasc [Amlodipine] Itching and Rash    Current Outpatient Prescriptions  Medication Sig Dispense Refill  . aspirin 81 MG chewable tablet Chew 81 mg by mouth daily.    Marland Kitchen atorvastatin (LIPITOR) 40 MG tablet Take 40 mg by mouth daily at 6 PM.     . cholecalciferol (VITAMIN D) 400 units TABS tablet Take 400 Units by mouth daily.    . cinacalcet (SENSIPAR) 30 MG tablet Take 30 mg by mouth daily.    . clopidogrel (PLAVIX) 75  MG tablet Take 75 mg by mouth daily.    . hydrALAZINE (APRESOLINE) 10 MG tablet Take 10 mg by mouth daily.    Marland Kitchen HYDROcodone-acetaminophen (NORCO) 5-325 MG tablet Take 1 tablet by mouth every 6 (six) hours as needed for moderate pain. 30 tablet 0  . insulin glargine (LANTUS) 100 unit/mL SOPN Inject 14 Units into the skin every morning.     . isosorbide mononitrate (IMDUR) 30 MG 24 hr tablet Take 15 mg by mouth daily. Takes 0.5 tablet    . metoprolol tartrate (LOPRESSOR) 25 MG tablet Take 12.5 mg by mouth 2 (two) times daily.     . Multiple Vitamin (MULTIVITAMIN WITH MINERALS) TABS tablet Take 1 tablet by mouth daily.    . ondansetron (ZOFRAN) 4 MG tablet Take 4 mg by mouth every 8 (eight) hours as needed for nausea or vomiting.    . polyvinyl alcohol (LIQUIFILM TEARS) 1.4 % ophthalmic solution Place  2 drops into both eyes 3 (three) times daily as needed for dry eyes.     . sevelamer carbonate (RENVELA) 800 MG tablet Take 800 mg by mouth 3 (three) times daily with meals.     No current facility-administered medications for this visit.         Physical Exam BP (!) 87/44   Pulse 85   Resp 16   Ht 5' 4"  (1.626 m)   Wt 128 lb (58.1 kg)   BMI 21.97 kg/m  Gen:  WD/WN, NAD Skin: incision C/D/I Heart: Regular rate and rhythm. Lungs: Clear and equal bilaterally     Assessment/Plan:  ESRD (end stage renal disease) on dialysis [facility]) Still using her catheter. 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  Steal syndrome dialysis vascular access, initial encounter [facility]) The patient appears to have steal syndrome after placement of her left arm AV graft. At this point, I would recommend a left upper extremity angiogram performed for further evaluation and potential treatment. Risks and benefits are discussed and she is agreeable to proceed.      Leotis Pain 06/18/2016, 3:19 PM   This note was created with Dragon medical transcription system.  Any errors from dictation are unintentional.

## 2016-06-18 NOTE — Assessment & Plan Note (Signed)
Still using her catheter. 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

## 2016-06-18 NOTE — Assessment & Plan Note (Signed)
The patient appears to have steal syndrome after placement of her left arm AV graft. At this point, I would recommend a left upper extremity angiogram performed for further evaluation and potential treatment. Risks and benefits are discussed and she is agreeable to proceed.

## 2016-06-24 ENCOUNTER — Other Ambulatory Visit (INDEPENDENT_AMBULATORY_CARE_PROVIDER_SITE_OTHER): Payer: Self-pay | Admitting: Vascular Surgery

## 2016-06-26 MED ORDER — CEFAZOLIN IN D5W 1 GM/50ML IV SOLN
1.0000 g | Freq: Once | INTRAVENOUS | Status: AC
  Administered 2016-06-27: 1 g via INTRAVENOUS

## 2016-06-27 ENCOUNTER — Ambulatory Visit
Admission: RE | Admit: 2016-06-27 | Discharge: 2016-06-27 | Disposition: A | Discharge status: Home / Self Care | Payer: Medicare Other | Source: Ambulatory Visit | Attending: Vascular Surgery | Admitting: Vascular Surgery

## 2016-06-27 ENCOUNTER — Encounter
Admission: RE | Disposition: A | Discharge status: Home / Self Care | Payer: Self-pay | Source: Ambulatory Visit | Attending: Vascular Surgery

## 2016-06-27 DIAGNOSIS — I132 Hypertensive heart and chronic kidney disease with heart failure and with stage 5 chronic kidney disease, or end stage renal disease: Secondary | ICD-10-CM | POA: Diagnosis not present

## 2016-06-27 DIAGNOSIS — N186 End stage renal disease: Secondary | ICD-10-CM | POA: Diagnosis not present

## 2016-06-27 DIAGNOSIS — Z7982 Long term (current) use of aspirin: Secondary | ICD-10-CM | POA: Diagnosis not present

## 2016-06-27 DIAGNOSIS — I2510 Atherosclerotic heart disease of native coronary artery without angina pectoris: Secondary | ICD-10-CM | POA: Diagnosis not present

## 2016-06-27 DIAGNOSIS — Z9842 Cataract extraction status, left eye: Secondary | ICD-10-CM | POA: Insufficient documentation

## 2016-06-27 DIAGNOSIS — Z951 Presence of aortocoronary bypass graft: Secondary | ICD-10-CM | POA: Diagnosis not present

## 2016-06-27 DIAGNOSIS — Y832 Surgical operation with anastomosis, bypass or graft as the cause of abnormal reaction of the patient, or of later complication, without mention of misadventure at the time of the procedure: Secondary | ICD-10-CM | POA: Diagnosis not present

## 2016-06-27 DIAGNOSIS — Z992 Dependence on renal dialysis: Secondary | ICD-10-CM | POA: Diagnosis not present

## 2016-06-27 DIAGNOSIS — Z955 Presence of coronary angioplasty implant and graft: Secondary | ICD-10-CM | POA: Insufficient documentation

## 2016-06-27 DIAGNOSIS — I429 Cardiomyopathy, unspecified: Secondary | ICD-10-CM | POA: Diagnosis not present

## 2016-06-27 DIAGNOSIS — Z90710 Acquired absence of both cervix and uterus: Secondary | ICD-10-CM | POA: Diagnosis not present

## 2016-06-27 DIAGNOSIS — Z888 Allergy status to other drugs, medicaments and biological substances status: Secondary | ICD-10-CM | POA: Diagnosis not present

## 2016-06-27 DIAGNOSIS — I252 Old myocardial infarction: Secondary | ICD-10-CM | POA: Diagnosis not present

## 2016-06-27 DIAGNOSIS — R011 Cardiac murmur, unspecified: Secondary | ICD-10-CM | POA: Insufficient documentation

## 2016-06-27 DIAGNOSIS — T82898A Other specified complication of vascular prosthetic devices, implants and grafts, initial encounter: Secondary | ICD-10-CM | POA: Diagnosis not present

## 2016-06-27 DIAGNOSIS — E1122 Type 2 diabetes mellitus with diabetic chronic kidney disease: Secondary | ICD-10-CM | POA: Diagnosis not present

## 2016-06-27 DIAGNOSIS — Z7902 Long term (current) use of antithrombotics/antiplatelets: Secondary | ICD-10-CM | POA: Diagnosis not present

## 2016-06-27 DIAGNOSIS — Z794 Long term (current) use of insulin: Secondary | ICD-10-CM | POA: Diagnosis not present

## 2016-06-27 DIAGNOSIS — I509 Heart failure, unspecified: Secondary | ICD-10-CM | POA: Diagnosis not present

## 2016-06-27 DIAGNOSIS — D631 Anemia in chronic kidney disease: Secondary | ICD-10-CM | POA: Insufficient documentation

## 2016-06-27 DIAGNOSIS — G458 Other transient cerebral ischemic attacks and related syndromes: Secondary | ICD-10-CM

## 2016-06-27 HISTORY — PX: UPPER EXTREMITY ANGIOGRAPHY: CATH118270

## 2016-06-27 LAB — GLUCOSE, CAPILLARY
Glucose-Capillary: 117 mg/dL — ABNORMAL HIGH (ref 65–99)
Glucose-Capillary: 140 mg/dL — ABNORMAL HIGH (ref 65–99)

## 2016-06-27 LAB — POTASSIUM (ARMC VASCULAR LAB ONLY): Potassium (ARMC vascular lab): 4.1 (ref 3.5–5.1)

## 2016-06-27 SURGERY — UPPER EXTREMITY ANGIOGRAPHY
Anesthesia: Moderate Sedation | Laterality: Left

## 2016-06-27 MED ORDER — ACETAMINOPHEN 325 MG PO TABS: 325.0000 mg | ORAL_TABLET | ORAL | Status: DC | PRN

## 2016-06-27 MED ORDER — METHYLPREDNISOLONE SODIUM SUCC 125 MG IJ SOLR
125.0000 mg | Freq: Once | INTRAMUSCULAR | Status: AC
  Administered 2016-06-27: 125 mg via INTRAVENOUS

## 2016-06-27 MED ORDER — GUAIFENESIN-DM 100-10 MG/5ML PO SYRP
15.0000 mL | ORAL_SOLUTION | ORAL | Status: DC | PRN
  Filled 2016-06-27: qty 15

## 2016-06-27 MED ORDER — PHENOL 1.4 % MT LIQD
1.0000 | OROMUCOSAL | Status: DC | PRN
  Filled 2016-06-27: qty 177

## 2016-06-27 MED ORDER — FAMOTIDINE 20 MG PO TABS: 40.0000 mg | ORAL_TABLET | ORAL | Status: DC | PRN

## 2016-06-27 MED ORDER — SODIUM CHLORIDE 0.9 % IV SOLN
500.0000 mL | Freq: Once | INTRAVENOUS | Status: DC | PRN
Start: 2016-06-27 — End: 2016-06-27

## 2016-06-27 MED ORDER — HYDRALAZINE HCL 20 MG/ML IJ SOLN: 5.0000 mg | INTRAMUSCULAR | Status: DC | PRN

## 2016-06-27 MED ORDER — DIPHENHYDRAMINE HCL 50 MG/ML IJ SOLN
50.0000 mg | Freq: Once | INTRAMUSCULAR | Status: AC
  Administered 2016-06-27: 50 mg via INTRAVENOUS

## 2016-06-27 MED ORDER — IOPAMIDOL (ISOVUE-300) INJECTION 61%
INTRAVENOUS | Status: DC | PRN
  Administered 2016-06-27: 65 mL via INTRA_ARTERIAL

## 2016-06-27 MED ORDER — SODIUM CHLORIDE 0.9 % IV SOLN
INTRAVENOUS | Status: DC
  Administered 2016-06-27: 10:00:00 via INTRAVENOUS

## 2016-06-27 MED ORDER — ACETAMINOPHEN 325 MG RE SUPP
325.0000 mg | RECTAL | Status: DC | PRN
  Filled 2016-06-27: qty 2

## 2016-06-27 MED ORDER — LORAZEPAM 2 MG/ML IJ SOLN
2.0000 mg | Freq: Once | INTRAMUSCULAR | Status: AC
  Administered 2016-06-27: 2 mg via INTRAVENOUS
  Filled 2016-06-27: qty 1

## 2016-06-27 MED ORDER — DIPHENHYDRAMINE HCL 50 MG/ML IJ SOLN
INTRAMUSCULAR | Status: DC
Start: 2016-06-27 — End: 2016-06-27
  Filled 2016-06-27: qty 1

## 2016-06-27 MED ORDER — ONDANSETRON HCL 4 MG/2ML IJ SOLN: 4.0000 mg | Freq: Four times a day (QID) | INTRAMUSCULAR | Status: DC | PRN

## 2016-06-27 MED ORDER — METOPROLOL TARTRATE 5 MG/5ML IV SOLN: 2.0000 mg | INTRAVENOUS | Status: DC | PRN

## 2016-06-27 MED ORDER — HEPARIN SODIUM (PORCINE) 1000 UNIT/ML IJ SOLN
INTRAMUSCULAR | Status: AC
  Filled 2016-06-27: qty 1

## 2016-06-27 MED ORDER — HYDROMORPHONE HCL 1 MG/ML IJ SOLN
1.0000 mg | Freq: Once | INTRAMUSCULAR | Status: AC
  Administered 2016-06-27: 0.5 mg via INTRAVENOUS

## 2016-06-27 MED ORDER — LIDOCAINE-EPINEPHRINE (PF) 2 %-1:200000 IJ SOLN
INTRAMUSCULAR | Status: AC
  Filled 2016-06-27: qty 20

## 2016-06-27 MED ORDER — LABETALOL HCL 5 MG/ML IV SOLN: 10.0000 mg | INTRAVENOUS | Status: DC | PRN

## 2016-06-27 MED ORDER — HEPARIN SODIUM (PORCINE) 1000 UNIT/ML IJ SOLN
INTRAMUSCULAR | Status: DC | PRN
  Administered 2016-06-27: 1000 [IU] via INTRAVENOUS
  Administered 2016-06-27: 3000 [IU] via INTRAVENOUS

## 2016-06-27 MED ORDER — HEPARIN (PORCINE) IN NACL 2-0.9 UNIT/ML-% IJ SOLN
INTRAMUSCULAR | Status: AC
Start: 2016-06-27 — End: ?
  Filled 2016-06-27: qty 1000

## 2016-06-27 MED ORDER — METHYLPREDNISOLONE SODIUM SUCC 125 MG IJ SOLR
INTRAMUSCULAR | Status: AC
  Filled 2016-06-27: qty 2

## 2016-06-27 MED ORDER — HYDROMORPHONE HCL 1 MG/ML IJ SOLN: 0.5000 mg | INTRAMUSCULAR | Status: DC | PRN

## 2016-06-27 MED ORDER — DIPHENHYDRAMINE HCL 50 MG/ML IJ SOLN
INTRAMUSCULAR | Status: AC
Start: 2016-06-27 — End: ?
  Filled 2016-06-27: qty 1

## 2016-06-27 MED ORDER — HYDROCODONE-ACETAMINOPHEN 5-325 MG PO TABS: 1.0000 | ORAL_TABLET | Freq: Four times a day (QID) | ORAL | 0 refills | Status: DC | PRN

## 2016-06-27 MED ORDER — HYDROMORPHONE HCL 1 MG/ML IJ SOLN
INTRAMUSCULAR | Status: AC
  Filled 2016-06-27: qty 1

## 2016-06-27 MED ORDER — OXYCODONE-ACETAMINOPHEN 5-325 MG PO TABS: 1.0000 | ORAL_TABLET | ORAL | Status: DC | PRN

## 2016-06-27 MED ORDER — METHYLPREDNISOLONE SODIUM SUCC 125 MG IJ SOLR: 125.0000 mg | INTRAMUSCULAR | Status: DC | PRN

## 2016-06-27 MED ORDER — HYDROMORPHONE HCL 1 MG/ML IJ SOLN: 1.0000 mg | Freq: Once | INTRAMUSCULAR | Status: DC

## 2016-06-27 SURGICAL SUPPLY — 19 items
BALLN ULTRVRSE 018 2.5X100X150 (BALLOONS) ×2
BALLN ULTRVRSE 2X300X150 (BALLOONS) ×1
BALLN ULTRVRSE 2X300X150 OTW (BALLOONS) ×1
BALLOON ULTRVRSE 2X300X150 OTW (BALLOONS) ×1 IMPLANT
BALLOON ULTRVS 018 2.5X100X150 (BALLOONS) ×1 IMPLANT
CANNULA 5F STIFF (CANNULA) ×2 IMPLANT
CATH ANGIO 5F 100CM .035 PIG (CATHETERS) ×2 IMPLANT
CATH CXI SUPP ANG 4FR 135 (MICROCATHETER) ×1 IMPLANT
CATH CXI SUPP ANG 4FR 135CM (MICROCATHETER) ×2
CATH H1 100CM (CATHETERS) ×2 IMPLANT
DEVICE PRESTO INFLATION (MISCELLANEOUS) ×2 IMPLANT
DEVICE STARCLOSE SE CLOSURE (Vascular Products) ×2 IMPLANT
DEVICE TORQUE (MISCELLANEOUS) ×2 IMPLANT
GLIDEWIRE ANGLED SS 035X260CM (WIRE) ×2 IMPLANT
PACK ANGIOGRAPHY (CUSTOM PROCEDURE TRAY) ×2 IMPLANT
SHEATH BRITE TIP 5FRX11 (SHEATH) ×2 IMPLANT
TUBING CONTRAST HIGH PRESS 72 (TUBING) ×2 IMPLANT
WIRE G V18X300CM (WIRE) ×2 IMPLANT
WIRE J 3MM .035X145CM (WIRE) ×2 IMPLANT

## 2016-06-27 NOTE — Progress Notes (Signed)
Patient awake and alert now. Sitting up and eating. Daughter at bedside.

## 2016-06-27 NOTE — H&P (Signed)
[facility] History & Physical Update  The patient was interviewed and re-examined.  The patient's previous History and Physical has been reviewed and is unchanged.  There is no change in the plan of care. We plan to proceed with the scheduled procedure.  Leotis Pain, MD  06/27/2016, 9:17 AM

## 2016-06-27 NOTE — Op Note (Signed)
OPERATIVE REPORT   PREOPERATIVE DIAGNOSIS: 1. End-stage renal disease. 2. 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  POSTOPERATIVE DIAGNOSIS: Same as above  PROCEDURE PERFORMED: 1. Ultrasound guidance vascular access to right femoral artery. 2. Catheter placement to left radial artery and left ulnar arteries  from right femoral approach. 3. Thoracic aortogram and selective left upper extremity angiogram  including selective images of the radial and ulnar arteries. 4. Percutaneous transluminal angioplasty of the entire left ulnar artery with a 2 mm diameter by 30 cm length angioplasty balloon to the hand and a 2.5 mm diameter by 10 cm length angioplasty balloon proximally 5. Percutaneous transluminal angioplasty of the left radial artery from its origin to just proximal to the wrist with 2 mm diameter by 30 cm length angioplasty balloon 6. StarClose closure device right femoral artery.  SURGEON: Algernon Huxley, MD  ANESTHESIA: Local with moderate conscious sedation for 45 minutes using 2 mg of Ativan, 0.5 mg of Dilaudid, and 50 mg of Benadryl  BLOOD LOSS: Minimal.  FLUOROSCOPY TIME: 7.3 minutes  INDICATION FOR PROCEDURE: This is a 66 y.o.female who presented to our office with steal syndrome. The patient's left loop forearm AV graft is working well, but their hand is numb and painful. To further evaluate this to determine what options would be possible to treat the steal syndrome, angiogram of the left upper extremity is indicated. Risks and benefits are discussed. Informed consent was obtained.  DESCRIPTION OF PROCEDURE: The patient was brought to the vascular suite. Moderate conscious sedation was administered during a face to face encounter with the patient throughout the procedure with my supervision of the RN administering medicines and monitoring the patient's vital signs, pulse oximetry, telemetry and mental status throughout  from the start of the procedure until the patient was taken to the recovery room.  Groins were shaved and prepped and sterile surgical field was created. The right femoral head was localized with fluoroscopy and the right femoral artery was then visualized with ultrasound and found to be widely patent. It was then accessed under direct ultrasound guidance without difficulty with a Seldinger needle and a permanent image was recorded. A J-wire and 5-French sheath were then placed. Pigtail catheter was placed into the ascending aorta and a thoracic aortogram was then performed in the LAO projection. This demonstrated normal origins to the great vessels without significant proximal stenoses and a normal configuration of the great vessels. The patient was given 4000 units of intravenous heparin and a headhunter catheter was used to selectively cannulate the left subclavian artery without difficulty. This was then sequentially advanced to the brachial artery and to the brachial bifurcation.  Steal was demonstrated with nearly all of the flow from the artery going into the graft and not downstream to the hand on images with the catheter proximal to the access. I then advanced a catheter beyond the access and into the radial and ulnar arteries.  The radial artery was entered first, but the catheter only went to the very origin of the radial artery, then exchanged for a CXI catheter to evaluate more distally in the radial artery.The radial artery essentially had no flow with what appeared to be a long total occlusion faint reconstitution in the hand. I was able to cross this occlusion with the wire and parked in a V 18 wire in the palmar arch in the hand. A 2 mm diameter by 30 cm length angioplasty balloon advanced just proximal to the wrist  and treated the entirety of the radial artery proximally back to the origin. This was inflated to 10 atm for 1 minute. Angiogram with a catheter selectively in the  radial artery showed this artery now to be open to the area of angioplasty at the wrist with in-line flow into the hand although residual stenosis was present in the hand. I did not feel we could improve this any further at this point. After this, the catheter was removed and exchanged for a CXI catheter and placed into the ulnar artery, this was evaluated. This demonstrated that the interosseous artery was the best flow distally originally. The ulnar artery had several areas of occlusion but did reconstitute in the palmar arch in the hand. I then used a V 18 wire to cross the occlusion and get out into a digital artery in the hand. The 2 mm diameter by 30 cm length angioplasty balloon was then taken all the way across the wrist into the mid palmar arch in the hand. This was inflated to 6 atm for 1 minute. Following this, there was in-line flow with no greater than 20% residual stenosis in the mid to distal ulnar artery, but the proximal ulnar artery still had a high-grade residual stenosis. I increased the balloon size to 2.5 mm x 10 cm in length and the proximal ulnar artery. This was inflated to 8 atm for 1 minute. Completion angiogram showed only about a 20% residual stenosis. With the catheter and the brachial artery distal to the access, there was now flow to the hand. There was still steal demonstrated however with the catheter pulled back into the mid brachial artery the majority of the flow still went into the AV graft. There was better flow distally, but the graft still had the dominant flow. At this point, I did not feel there was anything further we can do from an endovascular standpoint. The diagnostic catheter was removed. Oblique arteriogram was performed of the right femoral artery and StarClose closure device deployed in the usual fashion with excellent hemostatic result. The patient tolerated the procedure well and was taken to the recovery room in stable condition.   Leotis Pain 06/27/2016 11:10 AM

## 2016-06-27 NOTE — Progress Notes (Signed)
Patient is very sleepy. Arouses easily to verbal stimulation but goes right back to sleep. Daughter at bedside and aware. Will continue to monitor and try to rouse.

## 2016-06-28 ENCOUNTER — Encounter: Payer: Self-pay | Admitting: Vascular Surgery

## 2016-07-11 ENCOUNTER — Ambulatory Visit (INDEPENDENT_AMBULATORY_CARE_PROVIDER_SITE_OTHER): Payer: Medicare Other | Admitting: Vascular Surgery

## 2016-07-11 ENCOUNTER — Encounter (INDEPENDENT_AMBULATORY_CARE_PROVIDER_SITE_OTHER): Payer: Self-pay | Admitting: Vascular Surgery

## 2016-07-11 VITALS — BP 109/53 | HR 82 | Resp 16 | Ht 64.0 in | Wt 130.0 lb

## 2016-07-11 DIAGNOSIS — T82898A Other specified complication of vascular prosthetic devices, implants and grafts, initial encounter: Secondary | ICD-10-CM

## 2016-07-11 DIAGNOSIS — Z992 Dependence on renal dialysis: Secondary | ICD-10-CM | POA: Diagnosis not present

## 2016-07-11 DIAGNOSIS — E785 Hyperlipidemia, unspecified: Secondary | ICD-10-CM | POA: Diagnosis not present

## 2016-07-11 DIAGNOSIS — N186 End stage renal disease: Secondary | ICD-10-CM

## 2016-07-17 NOTE — Progress Notes (Signed)
Subjective:    Patient ID: Stephanie Ellis, female    DOB: 1950-08-21, 66 y.o.   MRN: [id number] Chief Complaint  Patient presents with  . Routine Post Op    2 week post op   Patient presents s/p a left upper extremity angiogram for suspected steal syndrome on 06/27/16. She presents today complaining of continued left hand numbness s/p the intervention. She denies any left hand pain or ulcer development. Discussed angiogram results. Patient denies any fever, nausea or vomiting.    Review of Systems  Constitutional: Negative.   HENT: Negative.   Eyes: Negative.   Respiratory: Negative.   Cardiovascular:       Left Hand Numbness.  Gastrointestinal: Negative.   Endocrine: Negative.   Genitourinary: Negative.   Musculoskeletal: Negative.   Skin: Negative.   Allergic/Immunologic: Negative.   Neurological: Negative.   Hematological: Negative.   Psychiatric/Behavioral: Negative.        Objective:   Physical Exam  Constitutional: She is oriented to person, place, and time. She appears well-developed and well-nourished. No distress.  HENT:  Head: Normocephalic and atraumatic.  Eyes: Conjunctivae are normal. Pupils are equal, round, and reactive to light.  Neck: Normal range of motion.  Cardiovascular: Normal rate, regular rhythm, normal heart sounds and intact distal pulses.   Pulses:      Radial pulses are 2+ on the right side, and 2+ on the left side.  Left Upper Extremity Access: Good bruit and thrill.  Pulmonary/Chest: Effort normal.  Musculoskeletal: Normal range of motion. She exhibits no edema.  Neurological: She is alert and oriented to person, place, and time.  Skin: Skin is warm and dry. She is not diaphoretic.  No hand ulcerations.  Psychiatric: She has a normal mood and affect. Her behavior is normal. Judgment and thought content normal.   BP (!) 109/53 (BP Location: Right Arm)   Pulse 82   Resp 16   Ht 5' 4"  (1.626 m)   Wt 130 lb (59 kg)   BMI 22.31 kg/m    Past Medical History:  Diagnosis Date  . Anemia   . Anorexia   . CHF (congestive heart failure) ([employer])   . Complication of anesthesia    WITH LAST STENT 10/17. PASED OUT AND HAD TO BE AWAKENED  . Coronary artery disease involving left main coronary artery 01/2015   [facility]: 70% LM, p-mLAD 50-60% (Resting FFR 0.75), mRCA 80-90%, ~40 Ost OM & D1  . Dysrhythmia   . ESRD (end stage renal disease) on dialysis [facility])    ESRD secondary to acute kidney failure s/p CABG  DIALYSIS M/W/F  . Essential hypertension   . Heart murmur   . Myocardial infarction   . 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 Marland Kitchen Haithcock: CABG x 3, LIMA to LAD, SVG to RCA, SVG to OM3, EVH  . Type II diabetes mellitus with complication (HCC)    CAD   Social History   Social History  . Marital status: Single    Spouse name: N/A  . Number of children: N/A  . Years of education: N/A   Occupational History  . Not on file.   Social History Main Topics  . Smoking status: Never Smoker  . Smokeless tobacco: Never Used  . Alcohol use No  . Drug use: No  . Sexual activity: Not on file   Other Topics Concern  . Not on file   Social History Narrative  . No narrative on file  Past Surgical History:  Procedure Laterality Date  . ABDOMINAL HYSTERECTOMY    . AV FISTULA PLACEMENT Left 05/30/2016   Procedure: ARTERIOVENOUS graft;  Surgeon: Algernon Huxley, MD;  Location: [facility] ORS;  Service: Vascular;  Laterality: Left;  . CARDIAC CATHETERIZATION  01/2015   [facility]: Ost LM 70%, p-m LAD 50-60% (Rest FFR + @ 0.75), mRCA 80-90%, ostD1 40%, pOM1 40%  . CATARACT EXTRACTION W/PHACO Left 01/18/2016   Procedure: CATARACT EXTRACTION PHACO AND INTRAOCULAR LENS PLACEMENT (IOC);  Surgeon: Eulogio Bear, MD;  Location: [facility] ORS;  Service: Ophthalmology;  Laterality: Left;  [place] 1.05 AP% 15.5 CDE 10.16 Fluid Pack Lot # [id number]  . CORONARY ANGIOPLASTY     SENTS 02/12/16  . CORONARY ARTERY BYPASS GRAFT  03/28/15    [facility]: Dr. Waldemar Dickens:  LIMA to LAD, SVG to RCA, SVG to OM3, EVH  . EYE SURGERY    . INSERTION OF DIALYSIS CATHETER    . TRANSTHORACIC ECHOCARDIOGRAM  January 2017    EF 60-65%. GR 2 DD. Mild degenerative mitral valve disease but no prolapse or regurgitation. Mild left atrial dilation. Mild to moderate LVH. Pericardial effusion gone  . UPPER EXTREMITY ANGIOGRAPHY Left 06/27/2016   Procedure: Upper Extremity Angiography;  Surgeon: Algernon Huxley, MD;  Location: [place] [facility];  Service: Cardiovascular;  Laterality: Left;   Family History  Problem Relation Age of Onset  . Diabetes Mellitus II Mother    Allergies  Allergen Reactions  . Chlorthalidone Anaphylaxis, Itching and Rash  . Fentanyl Rash  . Midazolam Rash  . Ace Inhibitors Other (See Comments)    Reaction:  Hyperkalemia, agitation   . Angiotensin Receptor Blockers Other (See Comments)    Reaction:  Hyperkalemia, agitation   . Norvasc [Amlodipine] Itching and Rash      Assessment & Plan:  Patient presents s/p a left upper extremity angiogram for suspected steal syndrome on 06/27/16. She presents today complaining of continued left hand numbness s/p the intervention. She denies any left hand pain or ulcer development. Discussed angiogram results. Patient denies any fever, nausea or vomiting.   1. Steal syndrome dialysis vascular access, initial encounter ([place]) - Stable Patient is sill experiencing left hand numbess. Patient is s/p a left upper extremity angiogram for possible steal. Minimal improvement s/p intervention. Will order stat steal study. No acute vascular compromise to hand on exam.   - VAS [place] STEAL EXAM; Future  2. ESRD (end stage renal disease) on dialysis ([place]) - Stable Patient is sill experiencing left hand numbess. Patient is s/p a left upper extremity angiogram for possible steal. Minimal improvement s/p intervention. Will order stat steal study. No acute vascular compromise to hand on exam.   - VAS [place] STEAL EXAM; Future  3.  Hyperlipidemia, unspecified hyperlipidemia type - Stable Encouraged good control as its slows the progression of atherosclerotic disease  Current Outpatient Prescriptions on File Prior to Visit  Medication Sig Dispense Refill  . aspirin 81 MG chewable tablet Chew 81 mg by mouth daily.    Marland Kitchen atorvastatin (LIPITOR) 40 MG tablet Take 40 mg by mouth daily at 6 PM.     . cholecalciferol (VITAMIN D) 400 units TABS tablet Take 400 Units by mouth daily.    . cinacalcet (SENSIPAR) 30 MG tablet Take 30 mg by mouth daily.    . clopidogrel (PLAVIX) 75 MG tablet Take 75 mg by mouth daily.    . hydrALAZINE (APRESOLINE) 10 MG tablet Take 10 mg by mouth daily.    Marland Kitchen  HYDROcodone-acetaminophen (NORCO) 5-325 MG tablet Take 1 tablet by mouth every 6 (six) hours as needed for moderate pain. 30 tablet 0  . insulin glargine (LANTUS) 100 unit/mL SOPN Inject 14 Units into the skin every evening.     . isosorbide mononitrate (IMDUR) 30 MG 24 hr tablet Take 15 mg by mouth daily.     Marland Kitchen loperamide (IMODIUM) 2 MG capsule Take 2 mg by mouth 2 (two) times daily as needed for diarrhea or loose stools.    . metoprolol tartrate (LOPRESSOR) 25 MG tablet Take 12.5 mg by mouth 2 (two) times daily.     . Multiple Vitamin (MULTIVITAMIN WITH MINERALS) TABS tablet Take 1 tablet by mouth daily.    . ondansetron (ZOFRAN) 4 MG tablet Take 4 mg by mouth every 8 (eight) hours as needed for nausea or vomiting.    . polyvinyl alcohol (LIQUIFILM TEARS) 1.4 % ophthalmic solution Place 2 drops into both eyes daily as needed for dry eyes.     . sevelamer carbonate (RENVELA) 800 MG tablet Take 800 mg by mouth 3 (three) times daily with meals.     No current facility-administered medications on file prior to visit.     There are no Patient Instructions on file for this visit. No Follow-up on file.   Dequante Tremaine A Kamar Callender, PA-C

## 2016-07-18 ENCOUNTER — Ambulatory Visit (INDEPENDENT_AMBULATORY_CARE_PROVIDER_SITE_OTHER): Payer: Medicare Other | Admitting: Vascular Surgery

## 2016-07-18 ENCOUNTER — Encounter (INDEPENDENT_AMBULATORY_CARE_PROVIDER_SITE_OTHER): Payer: Self-pay | Admitting: Vascular Surgery

## 2016-07-18 VITALS — BP 100/55 | HR 83 | Resp 17 | Wt 128.0 lb

## 2016-07-18 DIAGNOSIS — E785 Hyperlipidemia, unspecified: Secondary | ICD-10-CM

## 2016-07-18 DIAGNOSIS — N186 End stage renal disease: Secondary | ICD-10-CM

## 2016-07-18 DIAGNOSIS — Z992 Dependence on renal dialysis: Secondary | ICD-10-CM | POA: Diagnosis not present

## 2016-07-18 DIAGNOSIS — T82898A Other specified complication of vascular prosthetic devices, implants and grafts, initial encounter: Secondary | ICD-10-CM | POA: Diagnosis not present

## 2016-07-22 ENCOUNTER — Other Ambulatory Visit (INDEPENDENT_AMBULATORY_CARE_PROVIDER_SITE_OTHER): Payer: Self-pay | Admitting: Vascular Surgery

## 2016-07-22 ENCOUNTER — Encounter (INDEPENDENT_AMBULATORY_CARE_PROVIDER_SITE_OTHER): Payer: Self-pay

## 2016-07-22 NOTE — Progress Notes (Signed)
Subjective:    Patient ID: Stephanie Ellis, female    DOB: 1950-05-05, 66 y.o.   MRN: [id number] Chief Complaint  Patient presents with  . Edema   Patient well known to practice. Last seen on 07/11/16 - ordered steal study as the patient is still complaining of left hand numbness. She presents today at request of Dr. Lucky Cowboy. Patient complaining of swelling at graft site. The patient thought the graft under her skin was "swollen" and "knots". I explained the circular graft was permanent. Patient is s/p a left upper extremity angiogram on 06/27/16. Denies any ulceration to the hand. Denies any fever, nausea or vomiting.    Review of Systems  Constitutional: Negative.   HENT: Negative.   Eyes: Negative.   Respiratory: Negative.   Cardiovascular:       Left Hand Numbness  Gastrointestinal: Negative.   Endocrine: Negative.   Genitourinary:       ESRD  Musculoskeletal: Negative.   Skin: Negative.   Allergic/Immunologic: Negative.   Neurological: Negative.   Hematological: Negative.   Psychiatric/Behavioral: Negative.       Objective:   Physical Exam  Constitutional: She is oriented to person, place, and time. She appears well-developed and well-nourished. No distress.  HENT:  Head: Normocephalic and atraumatic.  Eyes: Conjunctivae are normal. Pupils are equal, round, and reactive to light.  Neck: Normal range of motion.  Cardiovascular: Normal rate, regular rhythm and normal heart sounds.   Pulses:      Radial pulses are 2+ on the right side.  Unable to palpate a left radial pulse. Hand is cool. No ulceration noted.   Pulmonary/Chest: Effort normal.  Musculoskeletal: Normal range of motion. She exhibits no edema.  Neurological: She is alert and oriented to person, place, and time.  Skin: Skin is warm and dry. She is not diaphoretic.  Incision is healed. Graft skin intact.   Psychiatric: She has a normal mood and affect. Her behavior is normal. Judgment and thought content normal.    Vitals reviewed.  BP (!) 100/55   Pulse 83   Resp 17   Wt 128 lb (58.1 kg)   BMI 21.97 kg/m   Past Medical History:  Diagnosis Date  . Anemia   . Anorexia   . CHF (congestive heart failure) ([place])   . Complication of anesthesia    WITH LAST STENT 10/17. PASED OUT AND HAD TO BE AWAKENED  . Coronary artery disease involving left main coronary artery 01/2015   [facility]: 70% LM, p-mLAD 50-60% (Resting FFR 0.75), mRCA 80-90%, ~40 Ost OM & D1  . Dysrhythmia   . ESRD (end stage renal disease) on dialysis [facility])    ESRD secondary to acute kidney failure s/p CABG  DIALYSIS M/W/F  . Essential hypertension   . Heart murmur   . Myocardial infarction   . 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 Marland Kitchen Haithcock: CABG x 3, LIMA to LAD, SVG to RCA, SVG to OM3, EVH  . Type II diabetes mellitus with complication (HCC)    CAD   Social History   Social History  . Marital status: Single    Spouse name: N/A  . Number of children: N/A  . Years of education: N/A   Occupational History  . Not on file.   Social History Main Topics  . Smoking status: Never Smoker  . Smokeless tobacco: Never Used  . Alcohol use No  . Drug use: No  . Sexual activity: Not on file  Other Topics Concern  . Not on file   Social History Narrative  . No narrative on file   Past Surgical History:  Procedure Laterality Date  . ABDOMINAL HYSTERECTOMY    . AV FISTULA PLACEMENT Left 05/30/2016   Procedure: ARTERIOVENOUS graft;  Surgeon: Algernon Huxley, MD;  Location: [facility] ORS;  Service: Vascular;  Laterality: Left;  . CARDIAC CATHETERIZATION  01/2015   [facility]: Ost LM 70%, p-m LAD 50-60% (Rest FFR + @ 0.75), mRCA 80-90%, ostD1 40%, pOM1 40%  . CATARACT EXTRACTION W/PHACO Left 01/18/2016   Procedure: CATARACT EXTRACTION PHACO AND INTRAOCULAR LENS PLACEMENT (IOC);  Surgeon: Eulogio Bear, MD;  Location: [facility] ORS;  Service: Ophthalmology;  Laterality: Left;  [place] 1.05 AP% 15.5 CDE 10.16 Fluid Pack Lot # [id number]  . CORONARY  ANGIOPLASTY     SENTS 02/12/16  . CORONARY ARTERY BYPASS GRAFT  03/28/15    [facility]: Dr. Waldemar Dickens: LIMA to LAD, SVG to RCA, SVG to OM3, EVH  . EYE SURGERY    . INSERTION OF DIALYSIS CATHETER    . TRANSTHORACIC ECHOCARDIOGRAM  January 2017    EF 60-65%. GR 2 DD. Mild degenerative mitral valve disease but no prolapse or regurgitation. Mild left atrial dilation. Mild to moderate LVH. Pericardial effusion gone  . UPPER EXTREMITY ANGIOGRAPHY Left 06/27/2016   Procedure: Upper Extremity Angiography;  Surgeon: Algernon Huxley, MD;  Location: [place] [facility] LAB;  Service: Cardiovascular;  Laterality: Left;   Family History  Problem Relation Age of Onset  . Diabetes Mellitus II Mother    Allergies  Allergen Reactions  . Chlorthalidone Anaphylaxis, Itching and Rash  . Fentanyl Rash  . Midazolam Rash  . Ace Inhibitors Other (See Comments)    Reaction:  Hyperkalemia, agitation   . Angiotensin Receptor Blockers Other (See Comments)    Reaction:  Hyperkalemia, agitation   . Norvasc [Amlodipine] Itching and Rash       Assessment & Plan:  Patient well known to practice. Last seen on 07/11/16 - ordered steal study as the patient is still complaining of left hand numbness. She presents today at request of Dr. Lucky Cowboy. Patient complaining of swelling at graft site. The patient thought the graft under her skin was "swollen" and "knots". I explained the circular graft was permanent. Patient is s/p a left upper extremity angiogram on 06/27/16. Denies any ulceration to the hand. Denies any fever, nausea or vomiting.   1. ESRD (end stage renal disease) on dialysis ([place]) - Stable. Patient is able to dialyze without issue. Her numbness does not worsen with dialysis.  Continue to dialyze as normal.   2. Steal syndrome dialysis vascular access, initial encounter ([place]) - Stable Patient with continued left hand numbness. Unable to palpate left radial pulse.  Worrisome for left hand steal.  May need banding? Recommend  one more LUE angiogram to assess anatomy / steal syndrome.   3. Hyperlipidemia, unspecified hyperlipidemia type - Stable Encouraged good control as its slows the progression of atherosclerotic disease  Current Outpatient Prescriptions on File Prior to Visit  Medication Sig Dispense Refill  . aspirin 81 MG chewable tablet Chew 81 mg by mouth daily.    Marland Kitchen atorvastatin (LIPITOR) 40 MG tablet Take 40 mg by mouth daily at 6 PM.     . cholecalciferol (VITAMIN D) 400 units TABS tablet Take 400 Units by mouth daily.    . cinacalcet (SENSIPAR) 30 MG tablet Take 30 mg by mouth daily.    . clopidogrel (PLAVIX) 75  MG tablet Take 75 mg by mouth daily.    . hydrALAZINE (APRESOLINE) 10 MG tablet Take 10 mg by mouth daily.    Marland Kitchen HYDROcodone-acetaminophen (NORCO) 5-325 MG tablet Take 1 tablet by mouth every 6 (six) hours as needed for moderate pain. 30 tablet 0  . insulin glargine (LANTUS) 100 unit/mL SOPN Inject 14 Units into the skin every evening.     . isosorbide mononitrate (IMDUR) 30 MG 24 hr tablet Take 15 mg by mouth daily.     Marland Kitchen loperamide (IMODIUM) 2 MG capsule Take 2 mg by mouth 2 (two) times daily as needed for diarrhea or loose stools.    . metoprolol tartrate (LOPRESSOR) 25 MG tablet Take 12.5 mg by mouth 2 (two) times daily.     . Multiple Vitamin (MULTIVITAMIN WITH MINERALS) TABS tablet Take 1 tablet by mouth daily.    . ondansetron (ZOFRAN) 4 MG tablet Take 4 mg by mouth every 8 (eight) hours as needed for nausea or vomiting.    . polyvinyl alcohol (LIQUIFILM TEARS) 1.4 % ophthalmic solution Place 2 drops into both eyes daily as needed for dry eyes.     . sevelamer carbonate (RENVELA) 800 MG tablet Take 800 mg by mouth 3 (three) times daily with meals.     No current facility-administered medications on file prior to visit.     There are no Patient Instructions on file for this visit. No Follow-up on file.   Ekam Besson A Shayden Bobier, PA-C

## 2016-07-26 ENCOUNTER — Emergency Department
Admission: EM | Admit: 2016-07-26 | Discharge: 2016-07-26 | Disposition: A | Discharge status: Home / Self Care | Payer: Medicare Other | Attending: Student in an Organized Health Care Education/Training Program | Admitting: Student in an Organized Health Care Education/Training Program

## 2016-07-26 DIAGNOSIS — Z992 Dependence on renal dialysis: Secondary | ICD-10-CM | POA: Insufficient documentation

## 2016-07-26 DIAGNOSIS — E1122 Type 2 diabetes mellitus with diabetic chronic kidney disease: Secondary | ICD-10-CM | POA: Diagnosis not present

## 2016-07-26 DIAGNOSIS — I132 Hypertensive heart and chronic kidney disease with heart failure and with stage 5 chronic kidney disease, or end stage renal disease: Secondary | ICD-10-CM | POA: Insufficient documentation

## 2016-07-26 DIAGNOSIS — N186 End stage renal disease: Secondary | ICD-10-CM | POA: Diagnosis not present

## 2016-07-26 DIAGNOSIS — Z79899 Other long term (current) drug therapy: Secondary | ICD-10-CM | POA: Diagnosis not present

## 2016-07-26 DIAGNOSIS — I509 Heart failure, unspecified: Secondary | ICD-10-CM | POA: Diagnosis not present

## 2016-07-26 DIAGNOSIS — I2510 Atherosclerotic heart disease of native coronary artery without angina pectoris: Secondary | ICD-10-CM | POA: Insufficient documentation

## 2016-07-26 DIAGNOSIS — H578 Other specified disorders of eye and adnexa: Secondary | ICD-10-CM | POA: Diagnosis present

## 2016-07-26 DIAGNOSIS — I252 Old myocardial infarction: Secondary | ICD-10-CM | POA: Diagnosis not present

## 2016-07-26 DIAGNOSIS — H1013 Acute atopic conjunctivitis, bilateral: Secondary | ICD-10-CM | POA: Diagnosis not present

## 2016-07-26 DIAGNOSIS — Z794 Long term (current) use of insulin: Secondary | ICD-10-CM | POA: Insufficient documentation

## 2016-07-26 MED ORDER — FLUORESCEIN SODIUM 1 MG OP STRP
ORAL_STRIP | OPHTHALMIC | Status: AC
  Administered 2016-07-26: 1 via OPHTHALMIC
  Filled 2016-07-26: qty 1

## 2016-07-26 MED ORDER — TETRACAINE HCL 0.5 % OP SOLN
2.0000 [drp] | Freq: Once | OPHTHALMIC | Status: AC
  Administered 2016-07-26: 2 [drp] via OPHTHALMIC
  Filled 2016-07-26: qty 2

## 2016-07-26 MED ORDER — FLUORESCEIN SODIUM 0.6 MG OP STRP
1.0000 | ORAL_STRIP | Freq: Once | OPHTHALMIC | Status: AC
  Administered 2016-07-26: 1 via OPHTHALMIC
  Filled 2016-07-26: qty 1

## 2016-07-26 MED ORDER — TETRACAINE HCL 0.5 % OP SOLN
OPHTHALMIC | Status: AC
  Administered 2016-07-26: 2 [drp] via OPHTHALMIC
  Filled 2016-07-26: qty 2

## 2016-07-26 MED ORDER — NEOMYCIN-POLYMYXIN-DEXAMETH 3.5-10000-0.1 OP SUSP: 2.0000 [drp] | Freq: Four times a day (QID) | OPHTHALMIC | 0 refills | Status: DC

## 2016-07-26 MED ORDER — NEOMYCIN-POLYMYXIN-DEXAMETH 3.5-10000-0.1 OP OINT
TOPICAL_OINTMENT | Freq: Once | OPHTHALMIC | Status: AC
  Administered 2016-07-26: 18:00:00 via OPHTHALMIC
  Filled 2016-07-26: qty 3.5

## 2016-07-26 MED ORDER — LORATADINE 5 MG PO TBDP: 5.0000 mg | ORAL_TABLET | Freq: Every morning | ORAL | 0 refills | Status: DC

## 2016-07-26 NOTE — ED Notes (Signed)
Visual acuity checked on pt, pt unable to visualize chart with neither eye. Pt states she wears glasses but does not have them with her. MD made aware

## 2016-07-26 NOTE — ED Provider Notes (Signed)
[facility] Emergency Department Provider Note    First MD Initiated Contact with Patient 07/26/16 1706     (approximate)  I have reviewed the triage vital signs and the nursing notes.   HISTORY  Chief Complaint Eye Problem    HPI Stephanie Ellis is a 66 y.o. female presents due to redness and a "film" to both eyes that started on Tuesday morning. States she woke up with thick matting to her eyes. Since then has been having itching and sensation blurry vision. Was told by her ophthalmologist to try eyedrops but has not had any improvement. Does have a history of allergies but is not taking any medications for this. States it does cause some discomfort on her eyelids bilaterally. Denies any significant photophobia. No sudden onset of visual loss. Denies any headaches.   Past Medical History:  Diagnosis Date  . Anemia   . Anorexia   . CHF (congestive heart failure) ([place])   . Complication of anesthesia    WITH LAST STENT 10/17. PASED OUT AND HAD TO BE AWAKENED  . Coronary artery disease involving left main coronary artery 01/2015   [facility]: 70% LM, p-mLAD 50-60% (Resting FFR 0.75), mRCA 80-90%, ~40 Ost OM & D1  . Dysrhythmia   . ESRD (end stage renal disease) on dialysis [facility])    ESRD secondary to acute kidney failure s/p CABG  DIALYSIS M/W/F  . Essential hypertension   . Heart murmur   . Myocardial infarction   . 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 Marland Kitchen [place]: CABG x 3, LIMA to LAD, SVG to RCA, SVG to OM3, EVH  . Type II diabetes mellitus with complication (HCC)    CAD   Family History  Problem Relation Age of Onset  . Diabetes Mellitus II Mother    Past Surgical History:  Procedure Laterality Date  . ABDOMINAL HYSTERECTOMY    . AV FISTULA PLACEMENT Left 05/30/2016   Procedure: ARTERIOVENOUS graft;  Surgeon: Algernon Huxley, MD;  Location: [facility] ORS;  Service: Vascular;  Laterality: Left;  . CARDIAC CATHETERIZATION  01/2015   [facility]: Ost LM 70%, p-m  LAD 50-60% (Rest FFR + @ 0.75), mRCA 80-90%, ostD1 40%, pOM1 40%  . CATARACT EXTRACTION W/PHACO Left 01/18/2016   Procedure: CATARACT EXTRACTION PHACO AND INTRAOCULAR LENS PLACEMENT (IOC);  Surgeon: Eulogio Bear, MD;  Location: [facility] ORS;  Service: Ophthalmology;  Laterality: Left;  [place] 1.05 AP% 15.5 CDE 10.16 Fluid Pack Lot # [id number]  . CORONARY ANGIOPLASTY     SENTS 02/12/16  . CORONARY ARTERY BYPASS GRAFT  03/28/15    [facility]: Dr. Waldemar Dickens: LIMA to LAD, SVG to RCA, SVG to OM3, EVH  . EYE SURGERY    . INSERTION OF DIALYSIS CATHETER    . TRANSTHORACIC ECHOCARDIOGRAM  January 2017    EF 60-65%. GR 2 DD. Mild degenerative mitral valve disease but no prolapse or regurgitation. Mild left atrial dilation. Mild to moderate LVH. Pericardial effusion gone  . UPPER EXTREMITY ANGIOGRAPHY Left 06/27/2016   Procedure: Upper Extremity Angiography;  Surgeon: Algernon Huxley, MD;  Location: [place] [facility];  Service: Cardiovascular;  Laterality: Left;   Patient Active Problem List   Diagnosis Date Noted  . Steal syndrome dialysis vascular access, initial encounter ([place]) 06/18/2016  . Hypertension 02/02/2016  . Hyperlipidemia 02/02/2016  . Coronary artery disease involving left main coronary artery 10/04/2015  . Abdominal pain of unknown etiology 10/04/2015  . ESRD (end stage renal disease)  on dialysis [facility])   . Type II diabetes mellitus with complication ([place])   . NSTEMI (non-ST elevated myocardial infarction) ([place])   . Right sided abdominal pain   . Elevated troponin 10/03/2015  . 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 Authorizing Provider  aspirin 81 MG chewable tablet Chew 81 mg by mouth daily.    Historical Provider, MD  atorvastatin (LIPITOR) 40 MG tablet Take 40 mg by mouth daily at 6 PM.     Historical Provider, MD  cholecalciferol (VITAMIN D) 400 units TABS tablet Take 400 Units by mouth daily.    Historical Provider,  MD  cinacalcet (SENSIPAR) 30 MG tablet Take 30 mg by mouth daily.    Historical Provider, MD  clopidogrel (PLAVIX) 75 MG tablet Take 75 mg by mouth daily.    Historical Provider, MD  hydrALAZINE (APRESOLINE) 10 MG tablet Take 10 mg by mouth daily.    Historical Provider, MD  HYDROcodone-acetaminophen (NORCO) 5-325 MG tablet Take 1 tablet by mouth every 6 (six) hours as needed for moderate pain. 06/27/16   Algernon Huxley, MD  insulin glargine (LANTUS) 100 unit/mL SOPN Inject 14 Units into the skin every evening.     Historical Provider, MD  isosorbide mononitrate (IMDUR) 30 MG 24 hr tablet Take 15 mg by mouth daily.     Historical Provider, MD  loperamide (IMODIUM) 2 MG capsule Take 2 mg by mouth 2 (two) times daily as needed for diarrhea or loose stools.    Historical Provider, MD  Loratadine 5 MG TBDP Take 1 tablet (5 mg total) by mouth every morning. 07/26/16   Merlyn Lot, MD  metoprolol tartrate (LOPRESSOR) 25 MG tablet Take 12.5 mg by mouth 2 (two) times daily.     Historical Provider, MD  Multiple Vitamin (MULTIVITAMIN WITH MINERALS) TABS tablet Take 1 tablet by mouth daily.    Historical Provider, MD  neomycin-polymyxin b-dexamethasone (MAXITROL) 3.5-10000-0.1 SUSP Place 2 drops into both eyes every 6 (six) hours. 07/26/16   Merlyn Lot, MD  ondansetron (ZOFRAN) 4 MG tablet Take 4 mg by mouth every 8 (eight) hours as needed for nausea or vomiting.    Historical Provider, MD  polyvinyl alcohol (LIQUIFILM TEARS) 1.4 % ophthalmic solution Place 2 drops into both eyes daily as needed for dry eyes.     Historical Provider, MD  sevelamer carbonate (RENVELA) 800 MG tablet Take 800 mg by mouth 3 (three) times daily with meals.    Historical Provider, MD    Allergies Chlorthalidone; Fentanyl; Midazolam; Ace inhibitors; Angiotensin receptor blockers; and Norvasc [amlodipine]    Social History Social History  Substance Use Topics  . Smoking status: Never Smoker  . Smokeless tobacco: Never Used   . Alcohol use No    Review of Systems Patient denies headaches, rhinorrhea, blurry vision, numbness, shortness of breath, chest pain, edema, cough, abdominal pain, nausea, vomiting, diarrhea, dysuria, fevers, rashes or hallucinations unless otherwise stated above in HPI. ____________________________________________   PHYSICAL EXAM:  VITAL SIGNS: Vitals:   07/26/16 1653  BP: (!) 155/59  Pulse: 86  Resp: 16  Temp: 98.3 F (36.8 C)    Constitutional: Alert and oriented. Well appearing and in no acute distress. Eyes: Conjunctivae are injected. PERRL. EOMI, no proptosis.  No ulceration or laceration under woods lamp.  IOP 30 bilaterally.  No disc cupping.  No hyphema or virteous hemorrhage Head: Atraumatic. Nose: No congestion/rhinnorhea. Mouth/Throat:  Mucous membranes are moist.  Oropharynx non-erythematous. Neck: No stridor. Painless ROM. No cervical spine tenderness to palpation Hematological/Lymphatic/Immunilogical: No cervical lymphadenopathy. Cardiovascular: Normal rate, regular rhythm. Grossly normal heart sounds.  Good peripheral circulation. Respiratory: Normal respiratory effort.  No retractions. Lungs CTAB. Gastrointestinal: Soft and nontender. No distention. No abdominal bruits. No CVA tenderness. Genitourinary:  Musculoskeletal: No lower extremity tenderness nor edema.  No joint effusions. Neurologic:  Normal speech and language. No gross focal neurologic deficits are appreciated. No gait instability. Skin:  Skin is warm, dry and intact. No rash noted. Psychiatric: Mood and affect are normal. Speech and behavior are normal.  ____________________________________________   LABS (all labs ordered are listed, but only abnormal results are displayed)  No results found for this or any previous visit (from the past 24  hour(s)). ____________________________________________  EKG____________________________________________  RADIOLOGY   ____________________________________________   PROCEDURES  Procedure(s) performed:  Procedures    Critical Care performed: no ____________________________________________   INITIAL IMPRESSION / ASSESSMENT AND PLAN / ED COURSE  Pertinent labs & imaging results that were available during my care of the patient were reviewed by me and considered in my medical decision making (see chart for details).  DDX: conjunctivitis, allergies, glaucoma  Mahati Vajda is a 66 y.o. who presents to the ED with bilateral eye redness, decreased vision and discomfort since Tuesday. She arrives afebrile and hemodynamically stable. Limited visual acuity the patient does not have her corrective lenses with her. Exam is most consistent with allergic conjunctivitis which is further supported by history but the patient does have elevated intraocular pressures. Hervisual field cuts. Not consistent with acute angle closure glaucoma.  I spoke with Dr. Karren Burly with ophthalmology who agrees is less consistent with acute angle closure and agrees to have patient seen first thing on Monday morning for reevaluation. Discussed signs and symptoms for which patient should return immediately to the hospital.  Have discussed with the patient and available family all diagnostics and treatments performed thus far and all questions were answered to the best of my ability. The patient demonstrates understanding and agreement with plan.       ____________________________________________   FINAL CLINICAL IMPRESSION(S) / ED DIAGNOSES  Final diagnoses:  Allergic conjunctivitis of both eyes      NEW MEDICATIONS STARTED DURING THIS VISIT:  New Prescriptions   LORATADINE 5 MG TBDP    Take 1 tablet (5 mg total) by mouth every morning.   NEOMYCIN-POLYMYXIN B-DEXAMETHASONE (MAXITROL) 3.5-10000-0.1 SUSP     Place 2 drops into both eyes every 6 (six) hours.     Note:  This document was prepared using Dragon voice recognition software and may include unintentional dictation errors.    Merlyn Lot, MD 07/26/16 [phone]

## 2016-07-26 NOTE — ED Triage Notes (Signed)
Pt reports to ED w/ c/o redness, swelling and "film" to both eyes.  Pt sts that she has been using eye drop w/o relief.  Pt A/OX4, resp even and unlabored. Pt also c/o photophobia. NAD

## 2016-07-26 NOTE — ED Notes (Addendum)
Pt c/o bilat eyes being painful and blurred vision xfew days, swelling and redness noted. Pt A&OX4, NAD noted

## 2016-07-30 ENCOUNTER — Encounter (INDEPENDENT_AMBULATORY_CARE_PROVIDER_SITE_OTHER): Payer: Self-pay

## 2016-07-31 ENCOUNTER — Encounter: Payer: Self-pay | Admitting: *Deleted

## 2016-07-31 MED ORDER — ARMC OPHTHALMIC DILATING DROPS
1.0000 "application " | OPHTHALMIC | Status: AC
  Administered 2016-08-01 (×2): 1 via OPHTHALMIC

## 2016-07-31 MED ORDER — CEFAZOLIN SODIUM-DEXTROSE 2-4 GM/100ML-% IV SOLN: 2.0000 g | Freq: Once | INTRAVENOUS | Status: DC

## 2016-07-31 MED ORDER — FAMOTIDINE 20 MG PO TABS: 40.0000 mg | ORAL_TABLET | ORAL | Status: DC | PRN

## 2016-07-31 MED ORDER — METHYLPREDNISOLONE SODIUM SUCC 125 MG IJ SOLR: 125.0000 mg | INTRAMUSCULAR | Status: DC | PRN

## 2016-08-01 ENCOUNTER — Ambulatory Visit: Payer: Medicare Other | Admitting: Certified Registered Nurse Anesthetist

## 2016-08-01 ENCOUNTER — Encounter: Payer: Self-pay | Admitting: Anesthesiology

## 2016-08-01 ENCOUNTER — Encounter
Admission: RE | Disposition: A | Discharge status: Home / Self Care | Payer: Self-pay | Source: Ambulatory Visit | Attending: Vascular Surgery

## 2016-08-01 ENCOUNTER — Ambulatory Visit
Admission: RE | Admit: 2016-08-01 | Discharge: 2016-08-01 | Disposition: A | Discharge status: Home / Self Care | Payer: Medicare Other | Source: Ambulatory Visit | Attending: Ophthalmology | Admitting: Ophthalmology

## 2016-08-01 DIAGNOSIS — N186 End stage renal disease: Secondary | ICD-10-CM | POA: Diagnosis not present

## 2016-08-01 DIAGNOSIS — I132 Hypertensive heart and chronic kidney disease with heart failure and with stage 5 chronic kidney disease, or end stage renal disease: Secondary | ICD-10-CM | POA: Insufficient documentation

## 2016-08-01 DIAGNOSIS — K219 Gastro-esophageal reflux disease without esophagitis: Secondary | ICD-10-CM | POA: Insufficient documentation

## 2016-08-01 DIAGNOSIS — I252 Old myocardial infarction: Secondary | ICD-10-CM | POA: Diagnosis not present

## 2016-08-01 DIAGNOSIS — E119 Type 2 diabetes mellitus without complications: Secondary | ICD-10-CM | POA: Diagnosis not present

## 2016-08-01 DIAGNOSIS — E113511 Type 2 diabetes mellitus with proliferative diabetic retinopathy with macular edema, right eye: Secondary | ICD-10-CM | POA: Diagnosis not present

## 2016-08-01 DIAGNOSIS — E1122 Type 2 diabetes mellitus with diabetic chronic kidney disease: Secondary | ICD-10-CM | POA: Insufficient documentation

## 2016-08-01 DIAGNOSIS — E7800 Pure hypercholesterolemia, unspecified: Secondary | ICD-10-CM | POA: Insufficient documentation

## 2016-08-01 DIAGNOSIS — H2511 Age-related nuclear cataract, right eye: Secondary | ICD-10-CM | POA: Diagnosis not present

## 2016-08-01 DIAGNOSIS — Z794 Long term (current) use of insulin: Secondary | ICD-10-CM | POA: Diagnosis not present

## 2016-08-01 DIAGNOSIS — Z7902 Long term (current) use of antithrombotics/antiplatelets: Secondary | ICD-10-CM | POA: Insufficient documentation

## 2016-08-01 DIAGNOSIS — H4089 Other specified glaucoma: Secondary | ICD-10-CM | POA: Insufficient documentation

## 2016-08-01 DIAGNOSIS — Z79899 Other long term (current) drug therapy: Secondary | ICD-10-CM | POA: Diagnosis not present

## 2016-08-01 DIAGNOSIS — Z992 Dependence on renal dialysis: Secondary | ICD-10-CM | POA: Diagnosis not present

## 2016-08-01 DIAGNOSIS — I2510 Atherosclerotic heart disease of native coronary artery without angina pectoris: Secondary | ICD-10-CM | POA: Insufficient documentation

## 2016-08-01 DIAGNOSIS — I509 Heart failure, unspecified: Secondary | ICD-10-CM | POA: Diagnosis not present

## 2016-08-01 DIAGNOSIS — I10 Essential (primary) hypertension: Secondary | ICD-10-CM | POA: Diagnosis not present

## 2016-08-01 DIAGNOSIS — Z951 Presence of aortocoronary bypass graft: Secondary | ICD-10-CM | POA: Diagnosis not present

## 2016-08-01 HISTORY — PX: CATARACT EXTRACTION W/PHACO: SHX586

## 2016-08-01 HISTORY — PX: INSERTION EXPRESS TUBE SHUNT: SHX6610

## 2016-08-01 HISTORY — DX: Pseudomonas (aeruginosa) (mallei) (pseudomallei) as the cause of diseases classified elsewhere: B96.5

## 2016-08-01 HISTORY — DX: Pure hypercholesterolemia, unspecified: E78.00

## 2016-08-01 HISTORY — DX: Bacteremia: R78.81

## 2016-08-01 LAB — POCT I-STAT 4, (NA,K, GLUC, HGB,HCT)
Glucose, Bld: 163 mg/dL — ABNORMAL HIGH (ref 65–99)
HCT: 39 % (ref 36.0–46.0)
Hemoglobin: 13.3 g/dL (ref 12.0–15.0)
Potassium: 5.8 mmol/L — ABNORMAL HIGH (ref 3.5–5.1)
Sodium: 135 mmol/L (ref 135–145)

## 2016-08-01 LAB — GLUCOSE, CAPILLARY
Glucose-Capillary: 156 mg/dL — ABNORMAL HIGH (ref 65–99)
Glucose-Capillary: 152 mg/dL — ABNORMAL HIGH (ref 65–99)

## 2016-08-01 SURGERY — UPPER EXTREMITY ANGIOGRAPHY
Anesthesia: Moderate Sedation | Laterality: Left

## 2016-08-01 SURGERY — PHACOEMULSIFICATION, CATARACT, WITH IOL INSERTION
Anesthesia: Monitor Anesthesia Care | Site: Eye | Laterality: Right | Wound class: Clean

## 2016-08-01 MED ORDER — ONDANSETRON HCL 4 MG/2ML IJ SOLN
INTRAMUSCULAR | Status: DC | PRN
  Administered 2016-08-01: 4 mg via INTRAVENOUS

## 2016-08-01 MED ORDER — SODIUM CHLORIDE 0.9 % IV SOLN
INTRAVENOUS | Status: DC
  Administered 2016-08-01: 11:00:00 via INTRAVENOUS

## 2016-08-01 MED ORDER — HYALURONIDASE HUMAN 150 UNIT/ML IJ SOLN
INTRAMUSCULAR | Status: AC
  Filled 2016-08-01: qty 1

## 2016-08-01 MED ORDER — LIDOCAINE HCL (PF) 4 % IJ SOLN
INTRAOCULAR | Status: DC | PRN
  Administered 2016-08-01: 4 mL via OPHTHALMIC

## 2016-08-01 MED ORDER — POVIDONE-IODINE 5 % OP SOLN
OPHTHALMIC | Status: DC | PRN
  Administered 2016-08-01: 1 via OPHTHALMIC

## 2016-08-01 MED ORDER — ONDANSETRON HCL 4 MG/2ML IJ SOLN: 4.0000 mg | Freq: Once | INTRAMUSCULAR | Status: DC | PRN

## 2016-08-01 MED ORDER — ONDANSETRON HCL 4 MG/2ML IJ SOLN
INTRAMUSCULAR | Status: AC
  Filled 2016-08-01: qty 2

## 2016-08-01 MED ORDER — ARMC OPHTHALMIC DILATING DROPS
OPHTHALMIC | Status: AC
  Administered 2016-08-01: 1 via OPHTHALMIC
  Filled 2016-08-01: qty 0.4

## 2016-08-01 MED ORDER — MOXIFLOXACIN HCL 0.5 % OP SOLN
1.0000 [drp] | OPHTHALMIC | Status: DC | PRN
  Administered 2016-08-01: 1 [drp] via OPHTHALMIC

## 2016-08-01 MED ORDER — SODIUM HYALURONATE 23 MG/ML IO SOLN
INTRAOCULAR | Status: DC | PRN
Start: 2016-08-01 — End: 2016-08-01
  Administered 2016-08-01: 0.6 mL via INTRAOCULAR

## 2016-08-01 MED ORDER — SODIUM HYALURONATE 10 MG/ML IO SOLN
INTRAOCULAR | Status: AC
  Filled 2016-08-01: qty 0.85

## 2016-08-01 MED ORDER — CARBACHOL 0.01 % IO SOLN
0.5000 mL | INTRAOCULAR | Status: DC
  Filled 2016-08-01: qty 1.5

## 2016-08-01 MED ORDER — MOXIFLOXACIN HCL 0.5 % OP SOLN
OPHTHALMIC | Status: AC
  Filled 2016-08-01: qty 3

## 2016-08-01 MED ORDER — TRIAMCINOLONE ACETONIDE 40 MG/ML IJ SUSP
INTRAMUSCULAR | Status: DC | PRN
  Administered 2016-08-01: 40 mg

## 2016-08-01 MED ORDER — EPINEPHRINE PF 1 MG/ML IJ SOLN
INTRAMUSCULAR | Status: AC
  Filled 2016-08-01: qty 3

## 2016-08-01 MED ORDER — POVIDONE-IODINE 5 % OP SOLN
OPHTHALMIC | Status: AC
  Filled 2016-08-01: qty 30

## 2016-08-01 MED ORDER — TRIAMCINOLONE ACETONIDE 40 MG/ML IJ SUSP
INTRAMUSCULAR | Status: AC
  Filled 2016-08-01: qty 1

## 2016-08-01 MED ORDER — LIDOCAINE HCL (PF) 4 % IJ SOLN
INTRAMUSCULAR | Status: DC | PRN
  Administered 2016-08-01: 6 mL via OPHTHALMIC

## 2016-08-01 MED ORDER — SODIUM HYALURONATE 10 MG/ML IO SOLN
INTRAOCULAR | Status: DC | PRN
  Administered 2016-08-01: 0.55 mL via INTRAOCULAR

## 2016-08-01 MED ORDER — EPINEPHRINE PF 1 MG/ML IJ SOLN
INTRAOCULAR | Status: DC | PRN
  Administered 2016-08-01: 200 mL via OPHTHALMIC

## 2016-08-01 MED ORDER — LIDOCAINE HCL (PF) 2 % IJ SOLN
INTRAMUSCULAR | Status: AC
  Filled 2016-08-01: qty 2

## 2016-08-01 MED ORDER — SODIUM HYALURONATE 23 MG/ML IO SOLN
INTRAOCULAR | Status: AC
  Filled 2016-08-01: qty 0.6

## 2016-08-01 MED ORDER — PROPOFOL 10 MG/ML IV BOLUS
INTRAVENOUS | Status: AC
  Filled 2016-08-01: qty 20

## 2016-08-01 MED ORDER — SEVOFLURANE IN SOLN
RESPIRATORY_TRACT | Status: AC
  Filled 2016-08-01: qty 250

## 2016-08-01 MED ORDER — PROPOFOL 10 MG/ML IV BOLUS
INTRAVENOUS | Status: DC | PRN
  Administered 2016-08-01: 20 mg via INTRAVENOUS

## 2016-08-01 MED ORDER — ARTIFICIAL TEARS OP OINT
TOPICAL_OINTMENT | OPHTHALMIC | Status: AC
  Filled 2016-08-01: qty 3.5

## 2016-08-01 SURGICAL SUPPLY — 32 items
ALLOGRAFT TUTOPLST SCER0.5X1.0 (Tissue) ×1 IMPLANT
BANDAGE EYE OVAL (MISCELLANEOUS) ×4 IMPLANT
CANNULA ANT/CHMB 27GA (MISCELLANEOUS) ×4 IMPLANT
CORD BIP STRL DISP 12FT (MISCELLANEOUS) ×2 IMPLANT
CUP MEDICINE 2OZ PLAST GRAD ST (MISCELLANEOUS) ×2 IMPLANT
DISSECTOR HYDRO NUCLEUS 50X22 (MISCELLANEOUS) ×2 IMPLANT
ERASER HMR WETFIELD 18G (MISCELLANEOUS) ×2 IMPLANT
GLOVE BIO SURGEON STRL SZ8 (GLOVE) ×2 IMPLANT
GLOVE BIOGEL M 6.5 STRL (GLOVE) ×2 IMPLANT
GLOVE SURG LX 7.5 STRW (GLOVE) ×1
GLOVE SURG LX STRL 7.5 STRW (GLOVE) ×1 IMPLANT
GOWN STRL REUS W/ TWL LRG LVL3 (GOWN DISPOSABLE) ×2 IMPLANT
GOWN STRL REUS W/TWL LRG LVL3 (GOWN DISPOSABLE) ×2
LENS IOL ACRSF IQ ULTRA 17.0 (Intraocular Lens) ×1 IMPLANT
LENS IOL ACRYSOF IQ 17.0 (Intraocular Lens) ×2 IMPLANT
NDL SAFETY ECLIPSE 18X1.5 (NEEDLE) ×1 IMPLANT
NEEDLE HYPO 18GX1.5 SHARP (NEEDLE) ×1
NEEDLE HYPO 30X.5 LL (NEEDLE) ×2 IMPLANT
PACK CATARACT (MISCELLANEOUS) ×2 IMPLANT
PACK CATARACT KING (MISCELLANEOUS) ×2 IMPLANT
PACK EYE AFTER SURG (MISCELLANEOUS) ×2 IMPLANT
SOL BSS BAG (MISCELLANEOUS) ×2
SOLUTION BSS BAG (MISCELLANEOUS) ×1 IMPLANT
SPEAR PVA EYE SURG (MISCELLANEOUS) ×4 IMPLANT
SUT ETHILON 10 0 CS140 6 (SUTURE) ×2 IMPLANT
SYR 3ML LL SCALE MARK (SYRINGE) ×4 IMPLANT
SYR 5ML LL (SYRINGE) ×2 IMPLANT
SYR TB 1ML 27GX1/2 LL (SYRINGE) ×2 IMPLANT
TUTOPLAST SCIERA 0.5X1.0 (Tissue) ×2 IMPLANT
VALVE GLAUCOMA AHMED (Prosthesis & Implant Heart) ×2 IMPLANT
WATER STERILE IRR 250ML POUR (IV SOLUTION) ×2 IMPLANT
WIPE NON LINTING 3.25X3.25 (MISCELLANEOUS) ×2 IMPLANT

## 2016-08-01 NOTE — Op Note (Signed)
OPERATIVE NOTE  Stephanie Ellis [id number] 08/01/2016   PREOPERATIVE DIAGNOSIS:  1.  Neovascular glaucoma.  2.  Nuclear sclerotic cataract, right eye. 3.  Proliferative diabetic retinopathy, right eye with macular edema.   POSTOPERATIVE DIAGNOSIS:  1.  Neovascular glaucoma.  2.  Nuclear sclerotic cataract, right eye. 3.  Proliferative diabetic retinopathy, right eye with macular edema.   PROCEDURE:  1.  Ahmed shunt placement with tutoplast, right eye. 2.  Cataract extraction and placement of intraocular lens, right eye. 3.  Intravitreal injection of kenalog.   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  IMPLANTS: LENS:   Implant Name Type Inv. Item Serial No. Manufacturer Lot No. LRB No. Used  LENS IOL ACRYSOF IQ 17.0 - [id number] Intraocular Lens LENS IOL ACRYSOF IQ 17.0 [id number] ALCON  Right 1  VALVE GLAUCOMA AHMED - [id number] Prosthesis & Implant Heart VALVE GLAUCOMA AHMED [id number] [employer]  Right 1  TUTOPLAST SCIERA 0.5X1.0 - [id number] Tissue TUTOPLAST SCIERA 0.5X1.0 [id number] RIT [id number] Right 1       ULTRASOUND TIME: 1 minutes 00 seconds.  CDE 6.93   SURGEON:  Benay Pillow, MD, MPH  ANESTHESIOLOGIST: Anesthesiologist: Alvin Critchley, MD CRNA: Doreen Salvage, CRNA; Darlyne Russian, CRNA    ANESTHESIA:Retrobulbar with 50/50% mix of 4% lidocaine preservative free and 0.75% bupivicaine with a small amount of vitrase and monitored anesthesia care.  ESTIMATED BLOOD LOSS: approx 1 mL.  COMPLICATIONS:None.  DESCRIPTION OF PROCEDURE: The patient was identified in the holding room and transported to the operating room and placed in the supine position. A time out was called identifiying the right eye as the operative eye and a retrobulbar block was administered in the standard fashion. It was prepped and draped in the usual sterile ophthalmic fashion.  First, attention was turned to the cataract.  A 1.0 millimeter clear-corneal paracentesis was made at the  10:30 position. 0.5 ml of preservative-free 1% lidocaine with epinephrine was injected into the anterior chamber. The anterior chamber was filled with Healon 5 viscoelastic.  A 2.4 millimeter keratome was used to make a near-clear corneal incision at the 8:00 position.  A curvilinear capsulorrhexis was made with a cystotome and capsulorrhexis forceps.  Balanced salt solution was used to hydrodissect and hydrodelineate the nucleus.   Phacoemulsification was then used in stop and chop fashion to remove the lens nucleus and epinucleus.  The remaining cortex was then removed using the irrigation and aspiration handpiece. Healon was then placed into the capsular bag to distend it for lens placement.  A lens was then injected into the capsular bag.  The remaining viscoelastic was aspirated.   Wounds were hydrated with balanced salt solution.  The anterior chamber was inflated to a physiologic pressure with balanced salt solution.  Intracameral vigamox 0.1 mL undiluted was injected into the eye and a drop placed onto the ocular surface.  No wound leaks were noted.  A 10-0 nylon suture was placed through the wound.     Attention was turned to the Ahmed valve placement. A marking pen was used to mark the 12:00 and 9:00 positions at the limbus.  A 5-0 silk traction suture was placed through the cornea and the eye rotated to expose the superotemporal quadrant. A corneal sponge was placed to keep the cornea moist.  The conj was dissected from the limbus and posteriorly. Radial relaxing incisions were made in the conjunctiva. A 19 gauge pencil cautery was used to obtain hemostasis for the exposed conjunctiva.  The calipers were set  to 8.5 mm and the sclera was marked posterior to the limbus. The Ahmed tube shunt was brought onto the field and was primed with BSS on a 27 gauge cannula. Visualization of the fluid from the plate was confirmed. The plate was secured with two 8-0 nylon on a spatulated needle  with partial thickness bites throught the sclera at the premarked sites and the sutures were buried in the eyelets of the plate.  The tube was trimmed to the appropriate length with an anterior bevel with westcott scissors.  A 22 gauge needle was used to create a short scleral tunnel and enter into the anterior chamber parallel to the iris. The tube was inserted with tube inserter forceps and was in good position, not contacting the cornea.  The tutoplast was brought onto the field and trimmed and secured using 8-0 vicryl sutures.  The conjunctiva was reapproximated to the limbus with an 8-0 vicyrl suture running to the reapproximate the relaxing incisions at both 9 and 12:00 positions.  Calipers were used to mark 3.5 mm posterior to the limbus in the inferonasal quadrant.  Kenalog 40 mg/mL 0.1 mL was injected intravitreally with a 1 cc syringe and a 30 gauge needle.  Additional kenalog was injected subconjunctivally superiorly.  The lid speculum was removed, the face was cleaned with a wet and dry. Copious ophthalmic lubricant, a patch and shield were placed over the right eye.   The patient experienced some nausea and vomitus at the end of the case, the patient was taken to the recovery room in stable condition without complications of anesthesia or surgery.  The patient is to leave the patch and shield in place and we will see him in the clinic for followup tomorrow.    Benay Pillow 08/01/2016, 1:28 PM

## 2016-08-01 NOTE — Anesthesia Preprocedure Evaluation (Addendum)
Anesthesia Evaluation  Patient identified by MRN, date of birth, ID band Patient awake    Reviewed: Allergy & Precautions, NPO status , Patient's Chart, lab work & pertinent test results, reviewed documented beta blocker date and time   History of Anesthesia Complications (+) history of anesthetic complications  Airway Mallampati: II       Dental  (+) Chipped   Pulmonary neg pulmonary ROS,    Pulmonary exam normal        Cardiovascular hypertension, Pt. on home beta blockers and Pt. on medications + CAD, + Past MI and +CHF  Normal cardiovascular exam+ dysrhythmias + Valvular Problems/Murmurs      Neuro/Psych PSYCHIATRIC DISORDERS Depression negative neurological ROS     GI/Hepatic GERD  Medicated and Controlled,  Endo/Other  diabetes, Well Controlled, Type 2, Insulin Dependent, Oral Hypoglycemic Agents  Renal/GU Dialysis and ESRFRenal disease  negative genitourinary   Musculoskeletal negative musculoskeletal ROS (+)   Abdominal Normal abdominal exam  (+)   Peds negative pediatric ROS (+)  Hematology  (+) anemia ,   Anesthesia Other Findings   Reproductive/Obstetrics                            Anesthesia Physical  Anesthesia Plan  ASA: IV  Anesthesia Plan: MAC   Post-op Pain Management:    Induction: Intravenous  Airway Management Planned: Nasal Cannula  Additional Equipment:   Intra-op Plan:   Post-operative Plan:   Informed Consent: I have reviewed the patients History and Physical, chart, labs and discussed the procedure including the risks, benefits and alternatives for the proposed anesthesia with the patient or authorized representative who has indicated his/her understanding and acceptance.   Dental advisory given  Plan Discussed with: CRNA and Surgeon  Anesthesia Plan Comments:         Anesthesia Quick Evaluation

## 2016-08-01 NOTE — Anesthesia Post-op Follow-up Note (Cosign Needed)
Anesthesia QCDR form completed.        

## 2016-08-01 NOTE — Transfer of Care (Signed)
Immediate Anesthesia Transfer of Care Note  Patient: Stephanie Ellis  Procedure(s) Performed: Procedure(s) with comments: CATARACT EXTRACTION PHACO AND INTRAOCULAR LENS PLACEMENT (IOC) (Right) - [place] 01:00.6 AP% 11.4 CDE 6.93  LOT # [id number] H INSERTION EXPRESS TUBE SHUNT (Right)  Patient Location: PACU  Anesthesia Type:MAC  Level of Consciousness: awake, alert  and oriented  Airway & Oxygen Therapy: Patient Spontanous Breathing  Post-op Assessment: Report given to RN and Post -op Vital signs reviewed and stable  Post vital signs: Reviewed and stable  Last Vitals:  Vitals:   08/01/16 1022 08/01/16 1330  BP: (!) 138/50 (!) 133/53  Pulse: 70 77  Resp: 16 15  Temp: 36.8 C 36.5 C    Last Pain:  Vitals:   08/01/16 1330  TempSrc: Temporal  PainSc: 3       Patients Stated Pain Goal: 0 (45/40/98 1191)  Complications: No apparent anesthesia complications

## 2016-08-01 NOTE — Anesthesia Postprocedure Evaluation (Signed)
Anesthesia Post Note  Patient: Bergen Melle  Procedure(s) Performed: Procedure(s) (LRB): CATARACT EXTRACTION PHACO AND INTRAOCULAR LENS PLACEMENT (IOC) (Right) INSERTION EXPRESS TUBE SHUNT (Right)  Patient location during evaluation: PACU Anesthesia Type: MAC Level of consciousness: awake and alert Pain management: pain level controlled Vital Signs Assessment: post-procedure vital signs reviewed and stable Respiratory status: spontaneous breathing, nonlabored ventilation, respiratory function stable and patient connected to nasal cannula oxygen Cardiovascular status: stable and blood pressure returned to baseline Anesthetic complications: no     Last Vitals:  Vitals:   08/01/16 1022 08/01/16 1330  BP: (!) 138/50 (!) 133/53  Pulse: 70 77  Resp: 16 15  Temp: 36.8 C 36.5 C    Last Pain:  Vitals:   08/01/16 1330  TempSrc: Temporal  PainSc: 3                  Darlyne Russian

## 2016-08-01 NOTE — Discharge Instructions (Signed)
Eye Surgery Discharge Instructions  Expect mild scratchy sensation or mild soreness. DO NOT RUB YOUR EYE!  The day of surgery:  Minimal physical activity, but bed rest is not required  No reading, computer work, or close hand work  No bending, lifting, or straining.  May watch TV  For 24 hours:  No driving, legal decisions, or alcoholic beverages  Safety precautions  Eat anything you prefer: It is better to start with liquids, then soup then solid foods.  _____ Eye patch should be worn until postoperative exam tomorrow.  ____ Solar shield eyeglasses should be worn for comfort in the sunlight/patch while sleeping  Resume all regular medications including aspirin or Coumadin if these were discontinued prior to surgery. You may shower, bathe, shave, or wash your hair. Tylenol may be taken for mild discomfort.  KEEP EYE PATCH ON UNTIL YOU SEE THE PHYSICIAN IN THE MORNING. HE WILL DISCUSS ANY FURTHER DROPS THAT WILL BE NEEDED.  BRING ALL EYE DROPS PROVIDED TO YOU BY THE OFFICE AND PRESCRIPTIONS FOR EYE DROPS SO HE CAN DECIDE/INSTRUCT YOU ON WHICH ONES WILL BE NEEDED.   HAVE THE CATARACT GLASSES WITH YOU AT THIS APPOINTMENT AND BRING ANY REGULAR GLASSES TO THE APPOINTMENT.  Call your doctor if you experience significant pain, nausea, or vomiting, fever > 101 or other signs of infection. [phone] or [phone] Specific instructions:  Follow-up Information    Benay Pillow, MD Follow up.   Specialty:  Ophthalmology Why:  FOLLOW UP AT [place] ON FRIDAY, THE 13TH OF APRIL AT 9:10 AM. KEEP PATCH ON EYE UNTIL YOU RETURN TO SEE HIM. Contact information: [address] [place] [address] [phone]

## 2016-08-01 NOTE — H&P (Signed)
The History and Physical notes are on paper, have been signed, and are to be scanned.   I have examined the patient and there are no changes to the H&P.   Benay Pillow 08/01/2016 10:31 AM

## 2016-08-02 ENCOUNTER — Encounter: Payer: Self-pay | Admitting: Ophthalmology

## 2016-08-02 ENCOUNTER — Telehealth (INDEPENDENT_AMBULATORY_CARE_PROVIDER_SITE_OTHER): Payer: Self-pay

## 2016-08-02 ENCOUNTER — Encounter (INDEPENDENT_AMBULATORY_CARE_PROVIDER_SITE_OTHER): Payer: Self-pay

## 2016-08-02 NOTE — Telephone Encounter (Signed)
Patients daughter called and left a message wanting to get her rescheduled for her procedure. I returned her call and left a message for her to contact me.

## 2016-08-05 ENCOUNTER — Other Ambulatory Visit (INDEPENDENT_AMBULATORY_CARE_PROVIDER_SITE_OTHER): Payer: Self-pay | Admitting: Vascular Surgery

## 2016-08-12 ENCOUNTER — Encounter: Payer: Self-pay | Admitting: *Deleted

## 2016-08-14 MED ORDER — TETRACAINE HCL 0.5 % OP SOLN
1.0000 [drp] | OPHTHALMIC | Status: DC | PRN
  Administered 2016-08-15 (×4): 1 [drp] via OPHTHALMIC

## 2016-08-14 MED ORDER — METHYLPREDNISOLONE SODIUM SUCC 125 MG IJ SOLR: 125.0000 mg | INTRAMUSCULAR | Status: DC | PRN

## 2016-08-14 MED ORDER — MOXIFLOXACIN HCL 0.5 % OP SOLN: 1.0000 [drp] | OPHTHALMIC | Status: DC | PRN

## 2016-08-14 MED ORDER — FAMOTIDINE 20 MG PO TABS: 40.0000 mg | ORAL_TABLET | ORAL | Status: DC | PRN

## 2016-08-14 MED ORDER — CEFAZOLIN SODIUM-DEXTROSE 1-4 GM/50ML-% IV SOLN: 1.0000 g | Freq: Once | INTRAVENOUS | Status: DC

## 2016-08-15 ENCOUNTER — Encounter
Admission: RE | Disposition: A | Discharge status: Home / Self Care | Payer: Self-pay | Source: Ambulatory Visit | Attending: Ophthalmology

## 2016-08-15 ENCOUNTER — Ambulatory Visit: Payer: Medicare Other | Admitting: Anesthesiology

## 2016-08-15 ENCOUNTER — Ambulatory Visit
Admission: RE | Admit: 2016-08-15 | Discharge: 2016-08-15 | Disposition: A | Discharge status: Home / Self Care | Payer: Medicare Other | Source: Ambulatory Visit | Attending: Ophthalmology | Admitting: Ophthalmology

## 2016-08-15 ENCOUNTER — Encounter: Payer: Self-pay | Admitting: *Deleted

## 2016-08-15 DIAGNOSIS — Z79899 Other long term (current) drug therapy: Secondary | ICD-10-CM | POA: Insufficient documentation

## 2016-08-15 DIAGNOSIS — I252 Old myocardial infarction: Secondary | ICD-10-CM | POA: Insufficient documentation

## 2016-08-15 DIAGNOSIS — Z794 Long term (current) use of insulin: Secondary | ICD-10-CM | POA: Insufficient documentation

## 2016-08-15 DIAGNOSIS — Z951 Presence of aortocoronary bypass graft: Secondary | ICD-10-CM | POA: Insufficient documentation

## 2016-08-15 DIAGNOSIS — I2510 Atherosclerotic heart disease of native coronary artery without angina pectoris: Secondary | ICD-10-CM | POA: Diagnosis not present

## 2016-08-15 DIAGNOSIS — Z992 Dependence on renal dialysis: Secondary | ICD-10-CM | POA: Insufficient documentation

## 2016-08-15 DIAGNOSIS — Z885 Allergy status to narcotic agent status: Secondary | ICD-10-CM | POA: Diagnosis not present

## 2016-08-15 DIAGNOSIS — E1122 Type 2 diabetes mellitus with diabetic chronic kidney disease: Secondary | ICD-10-CM | POA: Diagnosis not present

## 2016-08-15 DIAGNOSIS — H4089 Other specified glaucoma: Secondary | ICD-10-CM | POA: Diagnosis present

## 2016-08-15 DIAGNOSIS — N186 End stage renal disease: Secondary | ICD-10-CM | POA: Diagnosis not present

## 2016-08-15 DIAGNOSIS — Z955 Presence of coronary angioplasty implant and graft: Secondary | ICD-10-CM | POA: Insufficient documentation

## 2016-08-15 DIAGNOSIS — Z7982 Long term (current) use of aspirin: Secondary | ICD-10-CM | POA: Insufficient documentation

## 2016-08-15 DIAGNOSIS — E7800 Pure hypercholesterolemia, unspecified: Secondary | ICD-10-CM | POA: Diagnosis not present

## 2016-08-15 DIAGNOSIS — I120 Hypertensive chronic kidney disease with stage 5 chronic kidney disease or end stage renal disease: Secondary | ICD-10-CM | POA: Insufficient documentation

## 2016-08-15 HISTORY — PX: INSERTION OF AHMED VALVE: SHX6254

## 2016-08-15 LAB — POCT I-STAT 4, (NA,K, GLUC, HGB,HCT)
Glucose, Bld: 84 mg/dL (ref 65–99)
HCT: 42 % (ref 36.0–46.0)
Hemoglobin: 14.3 g/dL (ref 12.0–15.0)
Potassium: 3.5 mmol/L (ref 3.5–5.1)
Sodium: 136 mmol/L (ref 135–145)

## 2016-08-15 LAB — GLUCOSE, CAPILLARY
Glucose-Capillary: 96 mg/dL (ref 65–99)
Glucose-Capillary: 100 mg/dL — ABNORMAL HIGH (ref 65–99)

## 2016-08-15 SURGERY — INSERTION, GLAUCOMA VALVE, AHMED
Anesthesia: Monitor Anesthesia Care | Site: Eye | Laterality: Left | Wound class: Clean

## 2016-08-15 MED ORDER — SODIUM HYALURONATE 10 MG/ML IO SOLN
INTRAOCULAR | Status: AC
  Filled 2016-08-15: qty 0.85

## 2016-08-15 MED ORDER — SODIUM HYALURONATE 23 MG/ML IO SOLN
INTRAOCULAR | Status: AC
  Filled 2016-08-15: qty 0.6

## 2016-08-15 MED ORDER — LIDOCAINE HCL (PF) 4 % IJ SOLN
INTRAMUSCULAR | Status: DC | PRN
  Administered 2016-08-15: 4 mL via OPHTHALMIC

## 2016-08-15 MED ORDER — PROPOFOL 10 MG/ML IV BOLUS
INTRAVENOUS | Status: DC | PRN
  Administered 2016-08-15: 50 mg via INTRAVENOUS

## 2016-08-15 MED ORDER — SODIUM HYALURONATE 10 MG/ML IO SOLN
INTRAOCULAR | Status: DC | PRN
  Administered 2016-08-15: 0.55 mL via INTRAOCULAR

## 2016-08-15 MED ORDER — NALOXONE HCL 0.4 MG/ML IJ SOLN
INTRAMUSCULAR | Status: DC | PRN
  Administered 2016-08-15: 0.2 mg via INTRAVENOUS

## 2016-08-15 MED ORDER — SODIUM CHLORIDE 0.9 % IV SOLN
INTRAVENOUS | Status: DC
  Administered 2016-08-15: 11:00:00 via INTRAVENOUS

## 2016-08-15 MED ORDER — PHENYLEPHRINE HCL 10 MG/ML IJ SOLN
INTRAMUSCULAR | Status: AC
  Filled 2016-08-15: qty 1

## 2016-08-15 MED ORDER — EPINEPHRINE PF 1 MG/ML IJ SOLN
INTRAMUSCULAR | Status: AC
  Filled 2016-08-15: qty 2

## 2016-08-15 MED ORDER — POVIDONE-IODINE 5 % OP SOLN
OPHTHALMIC | Status: DC | PRN
  Administered 2016-08-15: 1 via OPHTHALMIC

## 2016-08-15 MED ORDER — TETRACAINE HCL 0.5 % OP SOLN
OPHTHALMIC | Status: AC
  Administered 2016-08-15: 1 [drp] via OPHTHALMIC
  Filled 2016-08-15: qty 2

## 2016-08-15 MED ORDER — ALFENTANIL 500 MCG/ML IJ INJ
INJECTION | INTRAVENOUS | Status: DC | PRN
  Administered 2016-08-15: 500 ug via INTRAVENOUS

## 2016-08-15 MED ORDER — GLYCOPYRROLATE 0.2 MG/ML IJ SOLN
INTRAMUSCULAR | Status: DC | PRN
  Administered 2016-08-15: 0.2 mg via INTRAVENOUS

## 2016-08-15 MED ORDER — HYALURONIDASE HUMAN 150 UNIT/ML IJ SOLN
INTRAMUSCULAR | Status: AC
  Filled 2016-08-15: qty 1

## 2016-08-15 MED ORDER — EPHEDRINE SULFATE 50 MG/ML IJ SOLN
INTRAMUSCULAR | Status: DC | PRN
  Administered 2016-08-15: 10 mg via INTRAVENOUS

## 2016-08-15 MED ORDER — PHENYLEPHRINE HCL 10 MG/ML IJ SOLN
INTRAMUSCULAR | Status: DC | PRN
  Administered 2016-08-15: 100 ug via INTRAVENOUS

## 2016-08-15 MED ORDER — ATROPINE SULFATE 1 MG/10ML IJ SOSY
PREFILLED_SYRINGE | INTRAMUSCULAR | Status: AC
  Filled 2016-08-15: qty 10

## 2016-08-15 MED ORDER — LIDOCAINE HCL (PF) 2 % IJ SOLN
INTRAMUSCULAR | Status: DC | PRN
  Administered 2016-08-15: 20 mg via INTRADERMAL

## 2016-08-15 MED ORDER — FENTANYL CITRATE (PF) 100 MCG/2ML IJ SOLN: 25.0000 ug | INTRAMUSCULAR | Status: DC | PRN

## 2016-08-15 MED ORDER — NEOMYCIN-POLYMYXIN-DEXAMETH 3.5-10000-0.1 OP OINT
TOPICAL_OINTMENT | OPHTHALMIC | Status: AC
  Filled 2016-08-15: qty 3.5

## 2016-08-15 MED ORDER — BUPIVACAINE HCL (PF) 0.75 % IJ SOLN
INTRAMUSCULAR | Status: AC
  Filled 2016-08-15: qty 10

## 2016-08-15 MED ORDER — NALOXONE HCL 0.4 MG/ML IJ SOLN
INTRAMUSCULAR | Status: AC
  Filled 2016-08-15: qty 1

## 2016-08-15 MED ORDER — TRIAMCINOLONE ACETONIDE 40 MG/ML IJ SUSP
INTRAMUSCULAR | Status: DC | PRN
  Administered 2016-08-15: 40 mg via INTRAMUSCULAR

## 2016-08-15 MED ORDER — POVIDONE-IODINE 5 % OP SOLN
OPHTHALMIC | Status: AC
  Filled 2016-08-15: qty 30

## 2016-08-15 MED ORDER — ONDANSETRON HCL 4 MG/2ML IJ SOLN
INTRAMUSCULAR | Status: DC | PRN
  Administered 2016-08-15: 4 mg via INTRAVENOUS

## 2016-08-15 MED ORDER — SODIUM CHLORIDE 0.9 % IV SOLN: INTRAVENOUS | Status: DC

## 2016-08-15 MED ORDER — TRIAMCINOLONE ACETONIDE 40 MG/ML IJ SUSP
INTRAMUSCULAR | Status: AC
  Filled 2016-08-15: qty 1

## 2016-08-15 MED ORDER — ATROPINE SULFATE 0.4 MG/ML IJ SOLN
INTRAMUSCULAR | Status: DC | PRN
  Administered 2016-08-15 (×2): 0.2 mg via INTRAVENOUS

## 2016-08-15 MED ORDER — EPHEDRINE SULFATE 50 MG/ML IJ SOLN
INTRAMUSCULAR | Status: AC
  Filled 2016-08-15: qty 1

## 2016-08-15 MED ORDER — LIDOCAINE HCL (PF) 2 % IJ SOLN
INTRAMUSCULAR | Status: AC
  Filled 2016-08-15: qty 2

## 2016-08-15 SURGICAL SUPPLY — 34 items
ALLOGRAFT TUTOPLST SCER0.5X1.0 (Tissue) ×1 IMPLANT
BANDAGE EYE OVAL (MISCELLANEOUS) ×4 IMPLANT
BLADE SURG MINI STRL (BLADE) ×2 IMPLANT
CANNULA ANT/CHMB 27GA (MISCELLANEOUS) ×2 IMPLANT
CORD BIP STRL DISP 12FT (MISCELLANEOUS) ×2 IMPLANT
CORNEAL LIGHT SHIELD (MISCELLANEOUS) ×2
CUP MEDICINE 2OZ PLAST GRAD ST (MISCELLANEOUS) ×4 IMPLANT
ERASER HMR WETFIELD 18G (MISCELLANEOUS) ×2 IMPLANT
GLOVE BIO SURGEON STRL SZ8 (GLOVE) ×2 IMPLANT
GLOVE SURG LX 6.5 MICRO (GLOVE) ×1
GLOVE SURG LX 7.5 STRW (GLOVE) ×1
GLOVE SURG LX STRL 6.5 MICRO (GLOVE) ×1 IMPLANT
GLOVE SURG LX STRL 7.5 STRW (GLOVE) ×1 IMPLANT
GOWN STRL REUS W/ TWL LRG LVL3 (GOWN DISPOSABLE) ×2 IMPLANT
GOWN STRL REUS W/TWL LRG LVL3 (GOWN DISPOSABLE) ×2
KNIFE SIDECUT EYE (MISCELLANEOUS) ×2 IMPLANT
NDL SAFETY ECLIPSE 18X1.5 (NEEDLE) ×1 IMPLANT
NEEDLE HYPO 18GX1.5 SHARP (NEEDLE) ×1
NEEDLE HYPO 27GX1-1/4 (NEEDLE) ×2 IMPLANT
NEEDLE HYPO 30X.5 LL (NEEDLE) ×2 IMPLANT
PACK CATARACT (MISCELLANEOUS) ×2 IMPLANT
SHIELD LIGHT CORNEAL (MISCELLANEOUS) ×1 IMPLANT
SOL BAL SALT 15ML (MISCELLANEOUS) ×2
SOLUTION BAL SALT 15ML (MISCELLANEOUS) ×1 IMPLANT
SPEAR PVA EYE SURG (MISCELLANEOUS) ×4 IMPLANT
SUT ETHILON 10 0 CS140 6 (SUTURE) ×2 IMPLANT
SUT SILK 5-0 (SUTURE) ×2 IMPLANT
SUT VICRYL  9 0 (SUTURE) ×1
SUT VICRYL 9 0 (SUTURE) ×1 IMPLANT
SYR 3ML LL SCALE MARK (SYRINGE) ×2 IMPLANT
TUTOPLAST SCIERA 0.5X1.0 (Tissue) ×2 IMPLANT
VALVE GLAUCOMA AHMED (Prosthesis & Implant Heart) ×2 IMPLANT
WIPE NON LINTING 3.25X3.25 (MISCELLANEOUS) ×2 IMPLANT
WIPE REMOVER UNIV ADH/BARRIER (WOUND CARE) IMPLANT

## 2016-08-15 NOTE — Anesthesia Post-op Follow-up Note (Cosign Needed)
Anesthesia QCDR form completed.        

## 2016-08-15 NOTE — Op Note (Signed)
  OPERATIVE NOTE  Stephanie Ellis [id number] 08/15/2016  PREOPERATIVE DIAGNOSIS:  Neovascular glaucoma, left eye.    POSTOPERATIVE DIAGNOSIS:    Neovascular glaucoma, left eye.     PROCEDURE:  Ahmed tube shunt and placement of tutoplast, left eye.  IMPLANT:  Implant Name Type Inv. Item Serial No. Manufacturer Lot No. LRB No. Used  TUTOPLAST SCIERA 0.5X1.0 - [id number] Tissue TUTOPLAST SCIERA 0.5X1.0 [id number] RIT [id number] Left 1  VALVE GLAUCOMA AHMED - [id number] Prosthesis & Implant Heart VALVE GLAUCOMA AHMED   [employer]   Left 1       SURGEON:  Benay Pillow, MD, MPH   ANESTHESIA:  MAC and retrobulbar block with 50%/50% mix of 0.75% bupivicaine and 2% preservative free lidocaine with a small amount of vitrase.  ESTIMATED BLOOD LOSS: <1 mL   COMPLICATIONS:  None.   DESCRIPTION OF PROCEDURE: The patient was identified in the holding room and transported to the operating room and placed in the supine position. A time out was called identifiying the right eye as the operative eye and a retrobulbar block was administered in the standard fashion. It was prepped and draped in the usual sterile ophthalmic fashion.  A marking pen was used to mark the 12:00 and 3:00 positions at the limbus.  A 5-0 silk traction suture was placed through the cornea and the eye rotated to expose the superotemporal quadrant. A corneal sponge was placed to keep the cornea moist.  The conj was dissected from the limbus and posteriorly. Radial relaxing incisions were made in the conjunctiva.  A 19 gauge pencil cautery was used to obtain hemostasis for the exposed conjunctiva.  The calipers were set to 8.5 mm and the sclera was marked posterior to the limbus. The Ahmed tube shunt was brought onto the field and was primed with BSS on a 27 gauge cannula. Visualization of the fluid from the plate was confirmed. The plate was secured with two 9-0 nylon on a spatulated needle with partial thickness  bites throught the sclera at the premarked sites and the sutures were buried in the eyelets of the plate.  The tube was trimmed to the appropriate length with an anterior bevel with westcott scissors.  A 23 gauge needle was used to create a short scleral tunnel and enter into the anterior chamber parallel to the iris. The tube was inserted with tube inserter forceps and was in good position, not contacting the cornea or iris.  The tutoplast was brought onto the field and trimmed and secured using 8-0 vicryl sutures.  The conjunctiva was reapproximated to the limbus with an 8-0 vicyrl suture running to the reapproximate the relaxing incisions at both 3:00 and 12:00 positions.  Kenalog 0.1 mL  (67m/mL) was injected into vitreous cavity with a 30 gauge needle 3.5 mm posterior to the limbus.  Subconjunctival kenalog was injected.   The lid speculum was removed, the face was cleaned with a wet and dry. Maxitrol ointment, a patch and shield were placed over the left eye.   The patient was taken to the recovery room in stable condition without complications of anesthesia or surgery.  The patient is to leave the patch and shield in place and we will see them in the clinic for followup tomorrow.

## 2016-08-15 NOTE — H&P (Signed)
The History and Physical notes are on paper, have been signed, and are to be scanned.   I have examined the patient and there are no changes to the H&P.   Benay Pillow 08/15/2016 11:11 AM

## 2016-08-15 NOTE — Transfer of Care (Signed)
Immediate Anesthesia Transfer of Care Note  Patient: Stephanie Ellis  Procedure(s) Performed: Procedure(s): INSERTION OF AHMED VALVE (Left)  Patient Location: PACU  Anesthesia Type:MAC  Level of Consciousness: awake, oriented and patient cooperative  Airway & Oxygen Therapy: Patient Spontanous Breathing  Post-op Assessment: Report given to RN and Post -op Vital signs reviewed and stable  Post vital signs: Reviewed and stable  Last Vitals:  Vitals:   08/15/16 0941  BP: (!) 150/52  Resp: 16  Temp: 36.9 C    Last Pain:  Vitals:   08/15/16 0941  TempSrc: Oral  PainSc: 8          Complications: No apparent anesthesia complications

## 2016-08-15 NOTE — Discharge Instructions (Signed)
Eye Surgery Discharge Instructions  Expect mild scratchy sensation or mild soreness. DO NOT RUB YOUR EYE!  The day of surgery:  Minimal physical activity, but bed rest is not required  No reading, computer work, or close hand work  No bending, lifting, or straining.  May watch TV  For 24 hours:  No driving, legal decisions, or alcoholic beverages  Safety precautions  Eat anything you prefer: It is better to start with liquids, then soup then solid foods.  _____ Eye patch should be worn until postoperative exam tomorrow.  ____ Solar shield eyeglasses should be worn for comfort in the sunlight/patch while sleeping  Resume all regular medications including aspirin or Coumadin if these were discontinued prior to surgery. You may shower, bathe, shave, or wash your hair. Tylenol may be taken for mild discomfort.  Call your doctor if you experience significant pain, nausea, or vomiting, fever > 101 or other signs of infection. [phone] or [phone] Specific instructions:  Follow-up Information    Benay Pillow, MD Follow up.   Specialty:  Ophthalmology Why:  April 27 at 10:35am Contact information: [address] [place] [address] [phone]

## 2016-08-15 NOTE — Anesthesia Postprocedure Evaluation (Signed)
Anesthesia Post Note  Patient: Stephanie Ellis  Procedure(s) Performed: Procedure(s) (LRB): INSERTION OF AHMED VALVE (Left)  Patient location during evaluation: PACU Anesthesia Type: MAC Level of consciousness: awake and alert Pain management: pain level controlled Vital Signs Assessment: post-procedure vital signs reviewed and stable Respiratory status: spontaneous breathing, nonlabored ventilation, respiratory function stable and patient connected to nasal cannula oxygen Cardiovascular status: stable and blood pressure returned to baseline Anesthetic complications: no     Last Vitals:  Vitals:   08/15/16 1340 08/15/16 1345  BP: (!) 146/59   Pulse: 75 74  Resp: (!) 21 10  Temp:  (!) 36 C    Last Pain:  Vitals:   08/15/16 0941  TempSrc: Oral  PainSc: [place]

## 2016-08-15 NOTE — Anesthesia Preprocedure Evaluation (Signed)
Anesthesia Evaluation  Patient identified by MRN, date of birth, ID band Patient awake    Reviewed: Allergy & Precautions, NPO status , Patient's Chart, lab work & pertinent test results, reviewed documented beta blocker date and time   Airway Mallampati: II  TM Distance: >3 FB     Dental  (+) Chipped   Pulmonary           Cardiovascular hypertension, Pt. on medications + CAD, + Past MI, + Cardiac Stents, + CABG and +CHF  + dysrhythmias + Valvular Problems/Murmurs      Neuro/Psych PSYCHIATRIC DISORDERS Depression    GI/Hepatic GERD  Controlled,  Endo/Other  diabetes, Type 2  Renal/GU Renal disease     Musculoskeletal   Abdominal   Peds  Hematology  (+) anemia ,   Anesthesia Other Findings Allergic to fentanyl.  Reproductive/Obstetrics                             Anesthesia Physical Anesthesia Plan  ASA: IV  Anesthesia Plan: MAC   Post-op Pain Management:    Induction:   Airway Management Planned:   Additional Equipment:   Intra-op Plan:   Post-operative Plan:   Informed Consent: I have reviewed the patients History and Physical, chart, labs and discussed the procedure including the risks, benefits and alternatives for the proposed anesthesia with the patient or authorized representative who has indicated his/her understanding and acceptance.     Plan Discussed with: CRNA  Anesthesia Plan Comments:         Anesthesia Quick Evaluation

## 2016-08-16 ENCOUNTER — Encounter: Payer: Self-pay | Admitting: Ophthalmology

## 2016-08-22 ENCOUNTER — Encounter
Admission: RE | Disposition: A | Discharge status: Home / Self Care | Payer: Self-pay | Source: Ambulatory Visit | Attending: Vascular Surgery

## 2016-08-22 ENCOUNTER — Ambulatory Visit
Admission: RE | Admit: 2016-08-22 | Discharge: 2016-08-22 | Disposition: A | Discharge status: Home / Self Care | Payer: Medicare Other | Source: Ambulatory Visit | Attending: Vascular Surgery | Admitting: Vascular Surgery

## 2016-08-22 DIAGNOSIS — E7800 Pure hypercholesterolemia, unspecified: Secondary | ICD-10-CM | POA: Insufficient documentation

## 2016-08-22 DIAGNOSIS — T82898A Other specified complication of vascular prosthetic devices, implants and grafts, initial encounter: Secondary | ICD-10-CM | POA: Diagnosis present

## 2016-08-22 DIAGNOSIS — I252 Old myocardial infarction: Secondary | ICD-10-CM | POA: Insufficient documentation

## 2016-08-22 DIAGNOSIS — E1122 Type 2 diabetes mellitus with diabetic chronic kidney disease: Secondary | ICD-10-CM | POA: Diagnosis not present

## 2016-08-22 DIAGNOSIS — N186 End stage renal disease: Secondary | ICD-10-CM | POA: Insufficient documentation

## 2016-08-22 DIAGNOSIS — I509 Heart failure, unspecified: Secondary | ICD-10-CM | POA: Diagnosis not present

## 2016-08-22 DIAGNOSIS — I132 Hypertensive heart and chronic kidney disease with heart failure and with stage 5 chronic kidney disease, or end stage renal disease: Secondary | ICD-10-CM | POA: Diagnosis not present

## 2016-08-22 DIAGNOSIS — Z833 Family history of diabetes mellitus: Secondary | ICD-10-CM | POA: Insufficient documentation

## 2016-08-22 DIAGNOSIS — Z888 Allergy status to other drugs, medicaments and biological substances status: Secondary | ICD-10-CM | POA: Diagnosis not present

## 2016-08-22 DIAGNOSIS — Z9841 Cataract extraction status, right eye: Secondary | ICD-10-CM | POA: Insufficient documentation

## 2016-08-22 DIAGNOSIS — Z951 Presence of aortocoronary bypass graft: Secondary | ICD-10-CM | POA: Diagnosis not present

## 2016-08-22 DIAGNOSIS — Z9889 Other specified postprocedural states: Secondary | ICD-10-CM | POA: Insufficient documentation

## 2016-08-22 DIAGNOSIS — Z885 Allergy status to narcotic agent status: Secondary | ICD-10-CM | POA: Insufficient documentation

## 2016-08-22 DIAGNOSIS — D631 Anemia in chronic kidney disease: Secondary | ICD-10-CM | POA: Insufficient documentation

## 2016-08-22 DIAGNOSIS — Z992 Dependence on renal dialysis: Secondary | ICD-10-CM | POA: Diagnosis not present

## 2016-08-22 DIAGNOSIS — I2510 Atherosclerotic heart disease of native coronary artery without angina pectoris: Secondary | ICD-10-CM | POA: Insufficient documentation

## 2016-08-22 DIAGNOSIS — Z90710 Acquired absence of both cervix and uterus: Secondary | ICD-10-CM | POA: Insufficient documentation

## 2016-08-22 DIAGNOSIS — Y832 Surgical operation with anastomosis, bypass or graft as the cause of abnormal reaction of the patient, or of later complication, without mention of misadventure at the time of the procedure: Secondary | ICD-10-CM | POA: Diagnosis not present

## 2016-08-22 DIAGNOSIS — Z955 Presence of coronary angioplasty implant and graft: Secondary | ICD-10-CM | POA: Diagnosis not present

## 2016-08-22 DIAGNOSIS — G458 Other transient cerebral ischemic attacks and related syndromes: Secondary | ICD-10-CM | POA: Diagnosis not present

## 2016-08-22 DIAGNOSIS — Z9842 Cataract extraction status, left eye: Secondary | ICD-10-CM | POA: Insufficient documentation

## 2016-08-22 DIAGNOSIS — K219 Gastro-esophageal reflux disease without esophagitis: Secondary | ICD-10-CM | POA: Diagnosis not present

## 2016-08-22 HISTORY — PX: UPPER EXTREMITY ANGIOGRAPHY: CATH118270

## 2016-08-22 LAB — GLUCOSE, CAPILLARY
Glucose-Capillary: 98 mg/dL (ref 65–99)
Glucose-Capillary: 104 mg/dL — ABNORMAL HIGH (ref 65–99)

## 2016-08-22 LAB — POTASSIUM (ARMC VASCULAR LAB ONLY): Potassium (ARMC vascular lab): 3.5 (ref 3.5–5.1)

## 2016-08-22 SURGERY — UPPER EXTREMITY ANGIOGRAPHY
Anesthesia: Moderate Sedation | Laterality: Left

## 2016-08-22 MED ORDER — OXYCODONE-ACETAMINOPHEN 5-325 MG PO TABS: 1.0000 | ORAL_TABLET | ORAL | Status: DC | PRN

## 2016-08-22 MED ORDER — DIPHENHYDRAMINE HCL 50 MG/ML IJ SOLN
INTRAMUSCULAR | Status: DC | PRN
  Administered 2016-08-22: 25 mg via INTRAVENOUS

## 2016-08-22 MED ORDER — HYDROMORPHONE HCL 1 MG/ML IJ SOLN: 1.0000 mg | Freq: Once | INTRAMUSCULAR | Status: DC | PRN

## 2016-08-22 MED ORDER — HYDROMORPHONE HCL 1 MG/ML IJ SOLN: 0.5000 mg | INTRAMUSCULAR | Status: DC | PRN

## 2016-08-22 MED ORDER — SODIUM CHLORIDE 0.9 % IV SOLN: 500.0000 mL | Freq: Once | INTRAVENOUS | Status: DC | PRN

## 2016-08-22 MED ORDER — ONDANSETRON HCL 4 MG/2ML IJ SOLN: 4.0000 mg | Freq: Four times a day (QID) | INTRAMUSCULAR | Status: DC | PRN

## 2016-08-22 MED ORDER — PHENOL 1.4 % MT LIQD: 1.0000 | OROMUCOSAL | Status: DC | PRN

## 2016-08-22 MED ORDER — DIPHENHYDRAMINE HCL 50 MG/ML IJ SOLN
INTRAMUSCULAR | Status: AC
Start: 2016-08-22 — End: 2016-08-22
  Filled 2016-08-22: qty 1

## 2016-08-22 MED ORDER — LORAZEPAM 2 MG/ML IJ SOLN
2.0000 mg | Freq: Once | INTRAMUSCULAR | Status: AC
  Administered 2016-08-22: 1 mg via INTRAVENOUS
  Filled 2016-08-22: qty 1

## 2016-08-22 MED ORDER — GUAIFENESIN-DM 100-10 MG/5ML PO SYRP: 15.0000 mL | ORAL_SOLUTION | ORAL | Status: DC | PRN

## 2016-08-22 MED ORDER — LABETALOL HCL 5 MG/ML IV SOLN: 10.0000 mg | INTRAVENOUS | Status: DC | PRN

## 2016-08-22 MED ORDER — HEPARIN SODIUM (PORCINE) 1000 UNIT/ML IJ SOLN
INTRAMUSCULAR | Status: AC
  Filled 2016-08-22: qty 1

## 2016-08-22 MED ORDER — HYDROMORPHONE HCL 1 MG/ML IJ SOLN
1.0000 mg | Freq: Once | INTRAMUSCULAR | Status: AC
  Administered 2016-08-22: 0.5 mg via INTRAVENOUS

## 2016-08-22 MED ORDER — METOPROLOL TARTRATE 5 MG/5ML IV SOLN: 2.0000 mg | INTRAVENOUS | Status: DC | PRN

## 2016-08-22 MED ORDER — LIDOCAINE-EPINEPHRINE (PF) 2 %-1:200000 IJ SOLN
INTRAMUSCULAR | Status: AC
  Filled 2016-08-22: qty 20

## 2016-08-22 MED ORDER — HYDROMORPHONE HCL 1 MG/ML IJ SOLN
INTRAMUSCULAR | Status: AC
  Administered 2016-08-22: 0.5 mg via INTRAVENOUS
  Filled 2016-08-22: qty 0.5

## 2016-08-22 MED ORDER — HEPARIN SODIUM (PORCINE) 1000 UNIT/ML IJ SOLN
INTRAMUSCULAR | Status: DC | PRN
  Administered 2016-08-22: 3000 [IU] via INTRAVENOUS

## 2016-08-22 MED ORDER — ACETAMINOPHEN 325 MG RE SUPP
325.0000 mg | RECTAL | Status: DC | PRN
  Filled 2016-08-22: qty 2

## 2016-08-22 MED ORDER — IOPAMIDOL (ISOVUE-300) INJECTION 61%
INTRAVENOUS | Status: DC | PRN
  Administered 2016-08-22: 60 mL via INTRA_ARTERIAL

## 2016-08-22 MED ORDER — FAMOTIDINE 20 MG PO TABS: 40.0000 mg | ORAL_TABLET | Freq: Once | ORAL | Status: DC | PRN

## 2016-08-22 MED ORDER — ACETAMINOPHEN 325 MG PO TABS: 325.0000 mg | ORAL_TABLET | ORAL | Status: DC | PRN

## 2016-08-22 MED ORDER — SODIUM CHLORIDE FLUSH 0.9 % IV SOLN
INTRAVENOUS | Status: AC
  Administered 2016-08-22: 07:00:00
  Filled 2016-08-22: qty 10

## 2016-08-22 MED ORDER — CEFAZOLIN SODIUM-DEXTROSE 1-4 GM/50ML-% IV SOLN
1.0000 g | Freq: Once | INTRAVENOUS | Status: AC
  Administered 2016-08-22: 1 g via INTRAVENOUS

## 2016-08-22 MED ORDER — METHYLPREDNISOLONE SODIUM SUCC 125 MG IJ SOLR: 125.0000 mg | Freq: Once | INTRAMUSCULAR | Status: DC | PRN

## 2016-08-22 MED ORDER — HYDRALAZINE HCL 20 MG/ML IJ SOLN: 5.0000 mg | INTRAMUSCULAR | Status: DC | PRN

## 2016-08-22 SURGICAL SUPPLY — 19 items
BALLN ULTRVRSE 2X300X150 (BALLOONS) ×1
BALLN ULTRVRSE 2X300X150 OTW (BALLOONS) ×1
BALLOON ULTRVRSE 2X300X150 OTW (BALLOONS) ×1 IMPLANT
CANNULA 5F STIFF (CANNULA) ×2 IMPLANT
CATH CXI SUPP ANG 4FR 135 (MICROCATHETER) ×1 IMPLANT
CATH CXI SUPP ANG 4FR 135CM (MICROCATHETER) ×2
CATH H1 100CM (CATHETERS) ×2 IMPLANT
CATH PIG 70CM (CATHETERS) ×2 IMPLANT
DEVICE PRESTO INFLATION (MISCELLANEOUS) ×2 IMPLANT
DEVICE STARCLOSE SE CLOSURE (Vascular Products) ×2 IMPLANT
GLIDEWIRE ANGLED SS 035X260CM (WIRE) ×2 IMPLANT
GUIDEWIRE PFTE-COATED .018X300 (WIRE) ×2 IMPLANT
NEEDLE ENTRY 21GA 7CM ECHOTIP (NEEDLE) ×2 IMPLANT
PACK ANGIOGRAPHY (CUSTOM PROCEDURE TRAY) ×2 IMPLANT
SHEATH BRITE TIP 5FRX11 (SHEATH) ×2 IMPLANT
SHEATH RAABE 6FRX70 (SHEATH) ×2 IMPLANT
WIRE G V18X300CM (WIRE) ×2 IMPLANT
WIRE J 3MM .035X145CM (WIRE) ×2 IMPLANT
WIRE MAGIC TORQUE 260C (WIRE) ×2 IMPLANT

## 2016-08-22 NOTE — Op Note (Signed)
OPERATIVE REPORT   PREOPERATIVE DIAGNOSIS: 1. End-stage renal disease. 2. Steal syndrome, left arm with brachial artery to brachial vein AV graft.  POSTOPERATIVE DIAGNOSIS: Same as above  PROCEDURE PERFORMED: 1. Ultrasound guidance vascular access to right femoral artery. 2. Catheter placement to left radial artery and left ulnar arteries  from right femoral approach. 3. Thoracic aortogram and selective left upper extremity angiogram  including selective images of the radial and ulnar arteries. 4. Percutaneous transluminal angioplasty of the entire ulnar artery to the palmar arch with a 2 mm diameter by 30 cm length angioplasty balloon 5. StarClose closure device right femoral artery.  SURGEON: Algernon Huxley, MD  ANESTHESIA: Local with moderate conscious sedation for 50 minutes using 1 mg of Ativan, 0.5 mg of Dilaudid, and 25 mg of fentanyl  BLOOD LOSS: Minimal.  FLUOROSCOPY TIME: 9.9 minutes  INDICATION FOR PROCEDURE: This is a 66 y.o.female who presented to our office with steal syndrome. The patient's left hand is numb and painful and that is the same side as her left arm AV graft. To further evaluate this to determine what options would be possible to treat the steal syndrome, angiogram of the left upper extremity is indicated. Risks and benefits are discussed. Informed consent was obtained.  DESCRIPTION OF PROCEDURE: The patient was brought to the vascular suite. Moderate conscious sedation was administered during a face to face encounter with the patient throughout the procedure with my supervision of the RN administering medicines and monitoring the patient's vital signs, pulse oximetry, telemetry and mental status throughout from the start of the procedure until the patient was taken to the recovery room.  Groins were shaved and prepped and sterile surgical field was created. The right femoral head was localized with fluoroscopy and the right  femoral artery was then visualized with ultrasound and found to be widely patent. It was then accessed under direct ultrasound guidance without difficulty with a Seldinger needle and a permanent image was recorded. A J-wire and 5-French sheath were then placed. Pigtail catheter was placed into the ascending aorta and a thoracic aortogram was then performed in the LAO projection. This demonstrated normal origins to the great vessels without significant proximal stenoses and a normal configuration of the great vessels. The patient was given 3000 units of intravenous heparin and a headhunter catheter was used to selectively cannulate the left subclavian artery without difficulty. This was then sequentially advanced to the brachial artery and to the brachial bifurcation. Imaging along the way showed a normal left subclavian artery with only mild irregularity, normal axillary artery and a normal brachial artery to the access site. Steal was demonstrated with much of the flow from the artery going into the graft and not downstream to the hand on images with the catheter proximal to the access. This was despite there being several areas within the graft that appeared to have greater than 50% stenosis and what appeared to be at least an 80% stenosis at the venous anastomosis into the brachial vein. I then advanced a catheter beyond the access and into the radial and ulnar arteries. I then exchanged for a CXI catheter beyond the brachial bifurcation and advanced into the proximal ulnar artery. The ulnar artery was heavily diseased with multiple areas of significant stenosis or short segment occlusion but a good palmar arch came off of the ulnar artery in the hand. I was able to cross these lesions with minimal difficulty with a 0.018 wire and parked this and the palmar arch in  the hand. I then used a 2 mm diameter by 30 cm length angioplasty balloon and treated the entire ulnar artery from its origin into the  hand and inflated to 12 atm for 1 minute. Completion angiogram with the CXI catheter placed back at the origin of the ulnar artery and the wire removed demonstrated a widely patent ulnar artery with brisk flow into the hand and no greater than 20% residual stenosis. I then turned my attention to evaluating the radial artery.  The catheter was pulled back to the brachial bifurcation at the elbow.  The radial artery was entered with a CXI catheter. Selective imaging showed a small occluded radial artery just beyond its origin with very poor distal reconstitution. Passes to try to cross the occlusion were not successful, and I did not feel this was likely to be fruitful. The diagnostic catheter was removed. Oblique arteriogram was performed of the right femoral artery and StarClose closure device deployed in the usual fashion with excellent hemostatic result. The patient tolerated the procedure well and was taken to the recovery room in stable condition.   Stephanie Ellis 08/22/2016 10:07 AM

## 2016-08-22 NOTE — H&P (Signed)
[facility] Admission History & Physical  MRN : [id number]  Stephanie Ellis is a 66 y.o. (May 09, 1950) female who presents with chief complaint of No chief complaint on file. Marland Kitchen  History of Present Illness: Patient presents for evaluation of steal syndrome of the left arm. She continues to have problems after her access placement on that side and subsequent pain and numbness in her hand. The access is working well. She denies fever or chills. No chest pain or shortness of breath.  Current Facility-Administered Medications  Medication Dose Route Frequency Provider Last Rate Last Dose  . ceFAZolin (ANCEF) IVPB 1 g/50 mL premix  1 g Intravenous Once Algernon Huxley, MD      . famotidine (PEPCID) tablet 40 mg  40 mg Oral Once PRN Algernon Huxley, MD      . HYDROmorphone (DILAUDID) 1 MG/ML injection           . HYDROmorphone (DILAUDID) injection 1 mg  1 mg Intravenous Once PRN Sela Hua, PA-C      . HYDROmorphone (DILAUDID) injection 1 mg  1 mg Intravenous Once Algernon Huxley, MD      . LORazepam (ATIVAN) injection 2 mg  2 mg Intravenous Once Algernon Huxley, MD      . methylPREDNISolone sodium succinate (SOLU-MEDROL) 125 mg/2 mL injection 125 mg  125 mg Intravenous Once PRN Algernon Huxley, MD      . ondansetron [facility]) injection 4 mg  4 mg Intravenous Q6H PRN Kimberly A Stegmayer, PA-C      . sodium chloride flush 0.9 % injection             Past Medical History:  Diagnosis Date  . Anemia   . Anorexia   . Bacteremia due to Pseudomonas   . CHF (congestive heart failure) ([place])   . Complication of anesthesia    WITH LAST STENT 10/17. PASED OUT AND HAD TO BE AWAKENED  . Coronary artery disease involving left main coronary artery 01/2015   [facility]: 70% LM, p-mLAD 50-60% (Resting FFR 0.75), mRCA 80-90%, ~40 Ost OM & D1  . Depression   . Dysrhythmia   . ESRD (end stage renal disease) on dialysis [facility])    ESRD secondary to acute kidney failure s/p CABG  DIALYSIS M/W/F  .  Essential hypertension   . GERD (gastroesophageal reflux disease)   . Heart murmur   . Hypercholesterolemia   . Myocardial infarction ([place])   . 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 Marland Kitchen Haithcock: CABG x 3, LIMA to LAD, SVG to RCA, SVG to OM3, EVH  . Type II diabetes mellitus with complication (HCC)    CAD    Past Surgical History:  Procedure Laterality Date  . ABDOMINAL HYSTERECTOMY    . AV FISTULA PLACEMENT Left 05/30/2016   Procedure: ARTERIOVENOUS graft;  Surgeon: Algernon Huxley, MD;  Location: [facility] ORS;  Service: Vascular;  Laterality: Left;  . CARDIAC CATHETERIZATION  01/2015   [facility]: Ost LM 70%, p-m LAD 50-60% (Rest FFR + @ 0.75), mRCA 80-90%, ostD1 40%, pOM1 40%  . CATARACT EXTRACTION W/PHACO Left 01/18/2016   Procedure: CATARACT EXTRACTION PHACO AND INTRAOCULAR LENS PLACEMENT (IOC);  Surgeon: Eulogio Bear, MD;  Location: [facility] ORS;  Service: Ophthalmology;  Laterality: Left;  [place] 1.05 AP% 15.5 CDE 10.16 Fluid Pack Lot # [id number]  . CATARACT EXTRACTION W/PHACO Right 08/01/2016   Procedure: CATARACT EXTRACTION PHACO AND INTRAOCULAR LENS PLACEMENT (IOC);  Surgeon:  Eulogio Bear, MD;  Location: [facility] ORS;  Service: Ophthalmology;  Laterality: Right;  [place] 01:00.6 AP% 11.4 CDE 6.93  LOT # [id number]  . COLONOSCOPY    . CORONARY ANGIOPLASTY     SENTS 02/12/16  . CORONARY ARTERY BYPASS GRAFT  03/28/15    [facility]: Dr. Waldemar Dickens: LIMA to LAD, SVG to RCA, SVG to OM3, EVH  . EYE SURGERY    . INSERTION EXPRESS TUBE SHUNT Right 08/01/2016   Procedure: INSERTION EXPRESS TUBE SHUNT;  Surgeon: Eulogio Bear, MD;  Location: [facility] ORS;  Service: Ophthalmology;  Laterality: Right;  . INSERTION OF AHMED VALVE Left 08/15/2016   Procedure: INSERTION OF AHMED VALVE;  Surgeon: Eulogio Bear, MD;  Location: [facility] ORS;  Service: Ophthalmology;  Laterality: Left;  . INSERTION OF DIALYSIS CATHETER    . TRANSTHORACIC ECHOCARDIOGRAM  January 2017    EF 60-65%. GR 2 DD. Mild degenerative mitral valve  disease but no prolapse or regurgitation. Mild left atrial dilation. Mild to moderate LVH. Pericardial effusion gone  . UPPER EXTREMITY ANGIOGRAPHY Left 06/27/2016   Procedure: Upper Extremity Angiography;  Surgeon: Algernon Huxley, MD;  Location: [facility];  Service: Cardiovascular;  Laterality: Left;    Social History Social History  Substance Use Topics  . Smoking status: Never Smoker  . Smokeless tobacco: Never Used  . Alcohol use No  No IV drug use  Family History Family History  Problem Relation Age of Onset  . Diabetes Mellitus II Mother   No bleeding disorders, clotting disorders, or autoimmune diseases  Allergies  Allergen Reactions  . Chlorthalidone Anaphylaxis, Itching and Rash  . Fentanyl Rash  . Midazolam Rash  . Ace Inhibitors Other (See Comments)    Reaction:  Hyperkalemia, agitation   . Angiotensin Receptor Blockers Other (See Comments)    Reaction:  Hyperkalemia, agitation   . Norvasc [Amlodipine] Itching and Rash     REVIEW OF SYSTEMS (Negative unless checked)  Constitutional: [] Weight loss  [] Fever  [] Chills Cardiac: [] Chest pain   [] Chest pressure   [x] Palpitations   [] Shortness of breath when laying flat   [] Shortness of breath at rest   [x] Shortness of breath with exertion. Vascular:  [] Pain in legs with walking   [] Pain in legs at rest   [] Pain in legs when laying flat   [] Claudication   [] Pain in feet when walking  [] Pain in feet at rest  [] Pain in feet when laying flat   [] History of DVT   [] Phlebitis   [] Swelling in legs   [] Varicose veins   [] Non-healing ulcers Pulmonary:   [] Uses home oxygen   [] Productive cough   [] Hemoptysis   [] Wheeze  [] COPD   [] Asthma Neurologic:  [] Dizziness  [] Blackouts   [] Seizures   [] History of stroke   [] History of TIA  [] Aphasia   [] Temporary blindness   [] Dysphagia   [x] Weakness or numbness in arms   [] Weakness or numbness in legs Musculoskeletal:  [] Arthritis   [] Joint swelling   [] Joint pain   [] Low back  pain Hematologic:  [] Easy bruising  [] Easy bleeding   [] Hypercoagulable state   [] Anemic  [] Hepatitis Gastrointestinal:  [] Blood in stool   [] Vomiting blood  [] Gastroesophageal reflux/heartburn   [] Difficulty swallowing. Genitourinary:  [x] Chronic kidney disease   [] Difficult urination  [] Frequent urination  [] Burning with urination   [] Blood in urine Skin:  [] Rashes   [] Ulcers   [] Wounds Psychological:  [x] History of anxiety   []  History of major depression.  Physical Examination  Vitals:  08/22/16 0653  BP: (!) 158/72  Pulse: 74  Resp: 14  Temp: 98.1 F (36.7 C)  TempSrc: Oral  SpO2: 98%  Weight: 57.6 kg (127 lb)  Height: 5' 4"  (1.626 m)   Body mass index is 21.8 kg/m. Gen: Thin, NAD. Appears older than stated age Head: [place]/AT, No temporalis wasting.  Ear/Nose/Throat: Hearing grossly intact, nares w/o erythema or drainage, oropharynx w/o Erythema/Exudate,  Eyes: Conjunctiva clear, sclera non-icteric Neck: Trachea midline.  No JVD.  Pulmonary:  Good air movement, respirations not labored, no use of accessory muscles.  Cardiac: RRR, normal S1, S2. Vascular: Good thrill in access left arm Vessel Right Left  Radial 1+ Palpable Trace Palpable                                   Musculoskeletal: M/S 5/5 throughout.  Extremities without ischemic changes.  No deformity or atrophy.  Neurologic: Sensation grossly intact in extremities.  Symmetrical.  Speech is fluent. Motor exam as listed above. Psychiatric: Judgment intact, Mood & affect appropriate for pt's clinical situation. Dermatologic: No rashes or ulcers noted.  No cellulitis or open wounds.     CBC Lab Results  Component Value Date   WBC 9.4 05/30/2016   HGB 14.3 08/15/2016   HCT 42.0 08/15/2016   MCV 97.3 05/30/2016   PLT 406 05/30/2016    BMET    Component Value Date/Time   NA 136 08/15/2016 1017   K 3.5 08/15/2016 1017   CL 97 (L) 05/30/2016 1054   CO2 27 05/30/2016 1054   GLUCOSE 84 08/15/2016  1017   BUN 30 (H) 05/30/2016 1054   CREATININE 4.95 (H) 05/30/2016 1054   CALCIUM 8.5 (L) 05/30/2016 1054   GFRNONAA 8 (L) 05/30/2016 1054   GFRAA 10 (L) 05/30/2016 1054   CrCl cannot be calculated (Patient's most recent lab result is older than the maximum 21 days allowed.).  COAG Lab Results  Component Value Date   INR 0.95 05/30/2016   INR 0.99 02/08/2016    Radiology No results found.    Assessment/Plan 1. Steal syndrome left arm. For upper extremity angiogram today for further evaluation and potential treatment.  2. End-stage renal disease. Access is functional, but apparently steal syndrome is becoming limiting problem. 3. Hypertension. Stable and outpatient medications and blood pressure control important in reducing the progression of atherosclerotic disease. On appropriate oral medications. 4. Coronary disease. Stable. No unstable anginal symptoms   Leotis Pain, MD  08/22/2016 8:06 AM

## 2016-08-23 ENCOUNTER — Encounter: Payer: Self-pay | Admitting: Vascular Surgery

## 2016-09-03 ENCOUNTER — Other Ambulatory Visit (INDEPENDENT_AMBULATORY_CARE_PROVIDER_SITE_OTHER): Payer: Medicare Other

## 2016-09-03 ENCOUNTER — Ambulatory Visit (INDEPENDENT_AMBULATORY_CARE_PROVIDER_SITE_OTHER): Payer: Medicare Other | Admitting: Vascular Surgery

## 2016-09-04 ENCOUNTER — Observation Stay
Admission: EM | Admit: 2016-09-04 | Discharge: 2016-09-05 | Disposition: A | Discharge status: Home / Self Care | Payer: Medicare Other | Attending: Internal Medicine | Admitting: Internal Medicine

## 2016-09-04 ENCOUNTER — Encounter: Payer: Self-pay | Admitting: Emergency Medicine

## 2016-09-04 ENCOUNTER — Emergency Department: Payer: Medicare Other

## 2016-09-04 DIAGNOSIS — N186 End stage renal disease: Secondary | ICD-10-CM | POA: Diagnosis not present

## 2016-09-04 DIAGNOSIS — I5032 Chronic diastolic (congestive) heart failure: Secondary | ICD-10-CM | POA: Diagnosis not present

## 2016-09-04 DIAGNOSIS — E7800 Pure hypercholesterolemia, unspecified: Secondary | ICD-10-CM | POA: Insufficient documentation

## 2016-09-04 DIAGNOSIS — Z794 Long term (current) use of insulin: Secondary | ICD-10-CM | POA: Insufficient documentation

## 2016-09-04 DIAGNOSIS — E1122 Type 2 diabetes mellitus with diabetic chronic kidney disease: Secondary | ICD-10-CM | POA: Insufficient documentation

## 2016-09-04 DIAGNOSIS — Z888 Allergy status to other drugs, medicaments and biological substances status: Secondary | ICD-10-CM | POA: Diagnosis not present

## 2016-09-04 DIAGNOSIS — I2510 Atherosclerotic heart disease of native coronary artery without angina pectoris: Secondary | ICD-10-CM | POA: Diagnosis present

## 2016-09-04 DIAGNOSIS — Z7982 Long term (current) use of aspirin: Secondary | ICD-10-CM | POA: Insufficient documentation

## 2016-09-04 DIAGNOSIS — E785 Hyperlipidemia, unspecified: Secondary | ICD-10-CM | POA: Diagnosis not present

## 2016-09-04 DIAGNOSIS — I132 Hypertensive heart and chronic kidney disease with heart failure and with stage 5 chronic kidney disease, or end stage renal disease: Secondary | ICD-10-CM | POA: Diagnosis not present

## 2016-09-04 DIAGNOSIS — T4145XA Adverse effect of unspecified anesthetic, initial encounter: Secondary | ICD-10-CM

## 2016-09-04 DIAGNOSIS — Z992 Dependence on renal dialysis: Secondary | ICD-10-CM

## 2016-09-04 DIAGNOSIS — I252 Old myocardial infarction: Secondary | ICD-10-CM | POA: Insufficient documentation

## 2016-09-04 DIAGNOSIS — Z885 Allergy status to narcotic agent status: Secondary | ICD-10-CM | POA: Diagnosis not present

## 2016-09-04 DIAGNOSIS — Z951 Presence of aortocoronary bypass graft: Secondary | ICD-10-CM | POA: Diagnosis not present

## 2016-09-04 DIAGNOSIS — Z79899 Other long term (current) drug therapy: Secondary | ICD-10-CM | POA: Insufficient documentation

## 2016-09-04 DIAGNOSIS — T8859XA Other complications of anesthesia, initial encounter: Secondary | ICD-10-CM

## 2016-09-04 DIAGNOSIS — R079 Chest pain, unspecified: Secondary | ICD-10-CM | POA: Diagnosis present

## 2016-09-04 DIAGNOSIS — K219 Gastro-esophageal reflux disease without esophagitis: Secondary | ICD-10-CM | POA: Insufficient documentation

## 2016-09-04 DIAGNOSIS — E118 Type 2 diabetes mellitus with unspecified complications: Secondary | ICD-10-CM | POA: Diagnosis present

## 2016-09-04 DIAGNOSIS — R778 Other specified abnormalities of plasma proteins: Secondary | ICD-10-CM | POA: Diagnosis present

## 2016-09-04 DIAGNOSIS — R0789 Other chest pain: Secondary | ICD-10-CM | POA: Diagnosis not present

## 2016-09-04 DIAGNOSIS — R7989 Other specified abnormal findings of blood chemistry: Secondary | ICD-10-CM | POA: Diagnosis present

## 2016-09-04 LAB — CBC WITH DIFFERENTIAL/PLATELET
Basophils Absolute: 0 10*3/uL (ref 0–0.1)
Basophils Relative: 0 %
Eosinophils Absolute: 0.1 10*3/uL (ref 0–0.7)
Eosinophils Relative: 2 %
HCT: 39.2 % (ref 35.0–47.0)
Hemoglobin: 12.7 g/dL (ref 12.0–16.0)
Lymphocytes Relative: 18 %
Lymphs Abs: 1.3 10*3/uL (ref 1.0–3.6)
MCH: 30.1 pg (ref 26.0–34.0)
MCHC: 32.5 g/dL (ref 32.0–36.0)
MCV: 92.6 fL (ref 80.0–100.0)
Monocytes Absolute: 0.6 10*3/uL (ref 0.2–0.9)
Monocytes Relative: 9 %
Neutro Abs: 5.2 10*3/uL (ref 1.4–6.5)
Neutrophils Relative %: 71 %
Platelets: 197 10*3/uL (ref 150–440)
RBC: 4.24 MIL/uL (ref 3.80–5.20)
RDW: 20.6 % — ABNORMAL HIGH (ref 11.5–14.5)
WBC: 7.4 10*3/uL (ref 3.6–11.0)

## 2016-09-04 LAB — BASIC METABOLIC PANEL
Anion gap: 12 (ref 5–15)
BUN: 21 mg/dL — ABNORMAL HIGH (ref 6–20)
CO2: 29 mmol/L (ref 22–32)
Calcium: 9.8 mg/dL (ref 8.9–10.3)
Chloride: 94 mmol/L — ABNORMAL LOW (ref 101–111)
Creatinine, Ser: 2.89 mg/dL — ABNORMAL HIGH (ref 0.44–1.00)
GFR calc Af Amer: 19 mL/min — ABNORMAL LOW (ref >60)
GFR calc non Af Amer: 16 mL/min — ABNORMAL LOW (ref >60)
Glucose, Bld: 131 mg/dL — ABNORMAL HIGH (ref 65–99)
Potassium: 3.7 mmol/L (ref 3.5–5.1)
Sodium: 135 mmol/L (ref 135–145)

## 2016-09-04 LAB — TROPONIN I
Troponin I: 0.06 ng/mL (ref <0.03)
Troponin I: 0.06 ng/mL (ref <0.03)

## 2016-09-04 LAB — MRSA PCR SCREENING: MRSA by PCR: NEGATIVE

## 2016-09-04 LAB — GLUCOSE, CAPILLARY: Glucose-Capillary: 142 mg/dL — ABNORMAL HIGH (ref 65–99)

## 2016-09-04 LAB — BRAIN NATRIURETIC PEPTIDE: B Natriuretic Peptide: 4123 pg/mL — ABNORMAL HIGH (ref 0.0–100.0)

## 2016-09-04 MED ORDER — ASPIRIN EC 81 MG PO TBEC
81.0000 mg | DELAYED_RELEASE_TABLET | Freq: Every day | ORAL | Status: DC
  Administered 2016-09-05: 81 mg via ORAL
  Filled 2016-09-04: qty 1

## 2016-09-04 MED ORDER — METOPROLOL TARTRATE 25 MG PO TABS
25.0000 mg | ORAL_TABLET | Freq: Two times a day (BID) | ORAL | Status: DC
  Administered 2016-09-04 – 2016-09-05 (×2): 25 mg via ORAL
  Filled 2016-09-04 (×2): qty 1

## 2016-09-04 MED ORDER — NITROGLYCERIN 2 % TD OINT
0.5000 [in_us] | TOPICAL_OINTMENT | Freq: Four times a day (QID) | TRANSDERMAL | Status: DC
  Administered 2016-09-04 – 2016-09-05 (×3): 0.5 [in_us] via TOPICAL
  Filled 2016-09-04 (×3): qty 1

## 2016-09-04 MED ORDER — ACETAMINOPHEN 325 MG PO TABS
650.0000 mg | ORAL_TABLET | Freq: Four times a day (QID) | ORAL | Status: DC | PRN
  Administered 2016-09-05: 650 mg via ORAL
  Filled 2016-09-04: qty 2

## 2016-09-04 MED ORDER — ASPIRIN 81 MG PO CHEW
243.0000 mg | CHEWABLE_TABLET | Freq: Once | ORAL | Status: AC
  Administered 2016-09-04: 243 mg via ORAL
  Filled 2016-09-04: qty 3

## 2016-09-04 MED ORDER — CHOLECALCIFEROL 10 MCG (400 UNIT) PO TABS
400.0000 [IU] | ORAL_TABLET | Freq: Every day | ORAL | Status: DC
  Filled 2016-09-04: qty 1

## 2016-09-04 MED ORDER — BISACODYL 5 MG PO TBEC: 5.0000 mg | DELAYED_RELEASE_TABLET | Freq: Every day | ORAL | Status: DC | PRN

## 2016-09-04 MED ORDER — POLYVINYL ALCOHOL 1.4 % OP SOLN
2.0000 [drp] | Freq: Every day | OPHTHALMIC | Status: DC | PRN
  Filled 2016-09-04: qty 15

## 2016-09-04 MED ORDER — ASPIRIN 81 MG PO CHEW: 81.0000 mg | CHEWABLE_TABLET | Freq: Every day | ORAL | Status: DC

## 2016-09-04 MED ORDER — DOCUSATE SODIUM 100 MG PO CAPS
100.0000 mg | ORAL_CAPSULE | Freq: Two times a day (BID) | ORAL | Status: DC
  Administered 2016-09-04 – 2016-09-05 (×2): 100 mg via ORAL
  Filled 2016-09-04 (×2): qty 1

## 2016-09-04 MED ORDER — SEVELAMER CARBONATE 800 MG PO TABS: 800.0000 mg | ORAL_TABLET | Freq: Three times a day (TID) | ORAL | Status: DC

## 2016-09-04 MED ORDER — TRAZODONE HCL 50 MG PO TABS: 25.0000 mg | ORAL_TABLET | Freq: Every evening | ORAL | Status: DC | PRN

## 2016-09-04 MED ORDER — ADULT MULTIVITAMIN W/MINERALS CH
1.0000 | ORAL_TABLET | Freq: Every day | ORAL | Status: DC
  Administered 2016-09-05: 1 via ORAL
  Filled 2016-09-04: qty 1

## 2016-09-04 MED ORDER — ONDANSETRON HCL 4 MG/2ML IJ SOLN: 4.0000 mg | Freq: Four times a day (QID) | INTRAMUSCULAR | Status: DC | PRN

## 2016-09-04 MED ORDER — ONDANSETRON HCL 4 MG PO TABS
4.0000 mg | ORAL_TABLET | Freq: Four times a day (QID) | ORAL | Status: DC | PRN
Start: 2016-09-04 — End: 2016-09-05

## 2016-09-04 MED ORDER — ATORVASTATIN CALCIUM 20 MG PO TABS
40.0000 mg | ORAL_TABLET | Freq: Every day | ORAL | Status: DC
  Administered 2016-09-04: 40 mg via ORAL
  Filled 2016-09-04: qty 2

## 2016-09-04 MED ORDER — INSULIN GLARGINE 100 UNIT/ML ~~LOC~~ SOLN
7.0000 [IU] | Freq: Every evening | SUBCUTANEOUS | Status: DC
  Filled 2016-09-04 (×2): qty 0.07

## 2016-09-04 MED ORDER — NITROGLYCERIN 0.4 MG SL SUBL
0.4000 mg | SUBLINGUAL_TABLET | SUBLINGUAL | Status: DC | PRN
  Administered 2016-09-04 (×2): 0.4 mg via SUBLINGUAL
  Filled 2016-09-04 (×2): qty 1

## 2016-09-04 MED ORDER — SODIUM CHLORIDE 0.9% FLUSH
3.0000 mL | Freq: Two times a day (BID) | INTRAVENOUS | Status: DC
  Administered 2016-09-04 – 2016-09-05 (×2): 3 mL via INTRAVENOUS

## 2016-09-04 MED ORDER — HEPARIN SODIUM (PORCINE) 5000 UNIT/ML IJ SOLN
5000.0000 [IU] | Freq: Three times a day (TID) | INTRAMUSCULAR | Status: DC
  Administered 2016-09-04 – 2016-09-05 (×2): 5000 [IU] via SUBCUTANEOUS
  Filled 2016-09-04 (×2): qty 1

## 2016-09-04 MED ORDER — ACETAMINOPHEN 650 MG RE SUPP: 650.0000 mg | Freq: Four times a day (QID) | RECTAL | Status: DC | PRN

## 2016-09-04 MED ORDER — SODIUM CHLORIDE 0.9% FLUSH: 3.0000 mL | INTRAVENOUS | Status: DC | PRN

## 2016-09-04 NOTE — ED Triage Notes (Signed)
Pt was at dialysis and started having heaviness in chest with [facility].came with fistula accessed.  Has had CABG.

## 2016-09-04 NOTE — ED Notes (Signed)
dialysis RN called to de access fistula.  Will come when done with current treatment.

## 2016-09-04 NOTE — ED Notes (Signed)
Pt assisted to call mother on her cell phone. Informed her mom had called again

## 2016-09-04 NOTE — ED Notes (Signed)
Dialysis rn at bedside to remove access

## 2016-09-04 NOTE — ED Notes (Signed)
Date and time results received: 09/04/16 1606 (use smartphrase ".now" to insert current time)  Test: troponin Critical Value: 0.06  Name of Provider Notified: [email or web address]

## 2016-09-04 NOTE — ED Notes (Addendum)
Daughter in room. NAD. Pain free.

## 2016-09-04 NOTE — H&P (Signed)
[facility] [place] NAME: Stephanie Ellis    MR#:  [id number]  DATE OF BIRTH:  Jun 25, 1950  DATE OF ADMISSION:  09/04/2016  PRIMARY CARE PHYSICIAN: System, Pcp Not In   REQUESTING/REFERRING PHYSICIAN: DR. Dineen Kid  CHIEF COMPLAINT: Chest pain    Chief Complaint  Patient presents with  . Chest Pain    HISTORY OF PRESENT ILLNESS:  Stephanie Ellis  is a 66 y.o. female with a known history of ESRD and hemodialysis, diabetes mellitus type 2 presented to emergency department with chest pressure and shortness of breath. She says she had about an hour and half of dialysis before she needed to go off the machine  secondary to chest pressure and shortness of breath that developed during dialysis treatment today. Patient was sent to emergency room because of chest pain, her history of fall bypass surgery a year ago. Patient denies any chest pressure at this time. She is a completely asymptomatic now. Initial EKG did not show any acute ST-T changes.  PAST MEDICAL HISTORY:   Past Medical History:  Diagnosis Date  . Anemia   . Anorexia   . Bacteremia due to Pseudomonas   . CHF (congestive heart failure) ([place])   . Complication of anesthesia    WITH LAST STENT 10/17. PASED OUT AND HAD TO BE AWAKENED  . Coronary artery disease involving left main coronary artery 01/2015   [facility]: 70% LM, p-mLAD 50-60% (Resting FFR 0.75), mRCA 80-90%, ~40 Ost OM & D1  . Depression   . Dysrhythmia   . ESRD (end stage renal disease) on dialysis [facility])    ESRD secondary to acute kidney failure s/p CABG  DIALYSIS M/W/F  . Essential hypertension   . GERD (gastroesophageal reflux disease)   . Heart murmur   . Hypercholesterolemia   . Myocardial infarction ([place])   . 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 Marland Kitchen Haithcock: CABG x 3, LIMA to LAD, SVG to RCA, SVG to OM3, EVH  . Type II diabetes mellitus with complication (HCC)    CAD    PAST SURGICAL HISTOIRY:   Past  Surgical History:  Procedure Laterality Date  . ABDOMINAL HYSTERECTOMY    . AV FISTULA PLACEMENT Left 05/30/2016   Procedure: ARTERIOVENOUS graft;  Surgeon: Algernon Huxley, MD;  Location: [facility] ORS;  Service: Vascular;  Laterality: Left;  . CARDIAC CATHETERIZATION  01/2015   [facility]: Ost LM 70%, p-m LAD 50-60% (Rest FFR + @ 0.75), mRCA 80-90%, ostD1 40%, pOM1 40%  . CATARACT EXTRACTION W/PHACO Left 01/18/2016   Procedure: CATARACT EXTRACTION PHACO AND INTRAOCULAR LENS PLACEMENT (IOC);  Surgeon: Eulogio Bear, MD;  Location: [facility] ORS;  Service: Ophthalmology;  Laterality: Left;  [place] 1.05 AP% 15.5 CDE 10.16 Fluid Pack Lot # [id number]  . CATARACT EXTRACTION W/PHACO Right 08/01/2016   Procedure: CATARACT EXTRACTION PHACO AND INTRAOCULAR LENS PLACEMENT (IOC);  Surgeon: Eulogio Bear, MD;  Location: [facility] ORS;  Service: Ophthalmology;  Laterality: Right;  [place] 01:00.6 AP% 11.4 CDE 6.93  LOT # [id number]  . COLONOSCOPY    . CORONARY ANGIOPLASTY     SENTS 02/12/16  . CORONARY ARTERY BYPASS GRAFT  03/28/15    [facility]: Dr. Waldemar Dickens: LIMA to LAD, SVG to RCA, SVG to OM3, EVH  . EYE SURGERY    . INSERTION EXPRESS TUBE SHUNT Right 08/01/2016   Procedure: INSERTION EXPRESS TUBE SHUNT;  Surgeon: Eulogio Bear, MD;  Location: [facility] ORS;  Service:  Ophthalmology;  Laterality: Right;  . INSERTION OF AHMED VALVE Left 08/15/2016   Procedure: INSERTION OF AHMED VALVE;  Surgeon: Eulogio Bear, MD;  Location: [facility] ORS;  Service: Ophthalmology;  Laterality: Left;  . INSERTION OF DIALYSIS CATHETER    . TRANSTHORACIC ECHOCARDIOGRAM  January 2017    EF 60-65%. GR 2 DD. Mild degenerative mitral valve disease but no prolapse or regurgitation. Mild left atrial dilation. Mild to moderate LVH. Pericardial effusion gone  . UPPER EXTREMITY ANGIOGRAPHY Left 06/27/2016   Procedure: Upper Extremity Angiography;  Surgeon: Algernon Huxley, MD;  Location: [place] CV LAB;  Service: Cardiovascular;  Laterality: Left;  . UPPER  EXTREMITY ANGIOGRAPHY Left 08/22/2016   Procedure: Upper Extremity Angiography;  Surgeon: Algernon Huxley, MD;  Location: [place] [facility];  Service: Cardiovascular;  Laterality: Left;    SOCIAL HISTORY:   Social History  Substance Use Topics  . Smoking status: Never Smoker  . Smokeless tobacco: Never Used  . Alcohol use No    FAMILY HISTORY:   Family History  Problem Relation Age of Onset  . Diabetes Mellitus II Mother     DRUG ALLERGIES:   Allergies  Allergen Reactions  . Chlorthalidone Anaphylaxis, Itching and Rash  . Fentanyl Rash  . Midazolam Rash  . Ace Inhibitors Other (See Comments)    Reaction:  Hyperkalemia, agitation   . Angiotensin Receptor Blockers Other (See Comments)    Reaction:  Hyperkalemia, agitation   . Norvasc [Amlodipine] Itching and Rash    REVIEW OF SYSTEMS:  CONSTITUTIONAL: No fever, fatigue or weakness.  EYES: No blurred or double vision.  EARS, NOSE, AND THROAT: No tinnitus or ear pain.  RESPIRATORY: No cough, shortness of breath, wheezing or hemoptysis.  CARDIOVASCULAR: Left-sided chest pressure that developed during HD today.Marland Kitchen GASTROINTESTINAL: No nausea, vomiting, diarrhea or abdominal pain.  GENITOURINARY: No dysuria, hematuria.  ENDOCRINE: No polyuria, nocturia,  HEMATOLOGY: No anemia, easy bruising or bleeding SKIN: No rash or lesion. MUSCULOSKELETAL: No joint pain or arthritis.   NEUROLOGIC: No tingling, numbness, weakness.  PSYCHIATRY: No anxiety or depression.   MEDICATIONS AT HOME:   Prior to Admission medications   Medication Sig Start Date End Date Taking? Authorizing Provider  aspirin 81 MG chewable tablet Chew 81 mg by mouth daily.   Yes [provider]  atorvastatin (LIPITOR) 40 MG tablet Take 40 mg by mouth daily at 6 PM.    Yes [provider]  Calcium Acetate, Phos Binder, (PHOSLO PO) Take by mouth.   Yes [provider]  cholecalciferol (VITAMIN D) 400 units TABS tablet Take 400 Units by  mouth daily.    Yes [provider]  insulin glargine (LANTUS) 100 unit/mL SOPN Inject 7 Units into the skin every evening.    Yes [provider]  loperamide (IMODIUM) 2 MG capsule Take 2 mg by mouth 2 (two) times daily as needed for diarrhea or loose stools.   Yes [provider]  Multiple Vitamin (MULTIVITAMIN WITH MINERALS) TABS tablet Take 1 tablet by mouth daily.   Yes [provider]  ondansetron (ZOFRAN) 4 MG tablet Take 4 mg by mouth every 8 (eight) hours as needed for nausea or vomiting.   Yes [provider]  polyvinyl alcohol (LIQUIFILM TEARS) 1.4 % ophthalmic solution Place 2 drops into both eyes daily as needed for dry eyes.    Yes [provider]  sevelamer carbonate (RENVELA) 800 MG tablet Take 800 mg by mouth 3 (three) times daily with  meals.   Yes [provider]  Loratadine 5 MG TBDP Take 1 tablet (5 mg total) by mouth every morning. Patient not taking: Reported on 09/04/2016 07/26/16   Merlyn Lot, MD  neomycin-polymyxin b-dexamethasone (MAXITROL) 3.5-10000-0.1 SUSP Place 2 drops into both eyes every 6 (six) hours. Patient not taking: Reported on 09/04/2016 07/26/16   Merlyn Lot, MD      VITAL SIGNS:  Blood pressure (!) 183/69, pulse 75, temperature 97.9 F (36.6 C), temperature source Oral, resp. rate (!) 24, height 5' 4"  (1.626 m), weight 57.6 kg (127 lb), SpO2 98 %.  PHYSICAL EXAMINATION:  GENERAL:  66 y.o.-year-old patient lying in the bed with no acute distress.  EYES: Pupils equal, round, reactive to light and accommodation. No scleral icterus. Extraocular muscles intact.  HEENT: Head atraumatic, normocephalic. Oropharynx and nasopharynx clear.  NECK:  Supple, no jugular venous distention. No thyroid enlargement, no tenderness.  LUNGS: Normal breath sounds bilaterally, no wheezing, rales,rhonchi or crepitation. No use of accessory muscles of respiration.  CARDIOVASCULAR: S1, S2 normal. No murmurs,  rubs, or gallops.  ABDOMEN: Soft, nontender, nondistended. Bowel sounds present. No organomegaly or mass.  EXTREMITIES: No pedal edema, cyanosis, or clubbing.  NEUROLOGIC: Cranial nerves II through XII are intact. Muscle strength 5/5 in all extremities. Sensation intact. Gait not checked.  PSYCHIATRIC: The patient is alert and oriented x 3.  SKIN: No obvious rash, lesion, or ulcer.   LABORATORY PANEL:   CBC  Recent Labs Lab 09/04/16 1525  WBC 7.4  HGB 12.7  HCT 39.2  PLT 197   ------------------------------------------------------------------------------------------------------------------  Chemistries   Recent Labs Lab 09/04/16 1525  NA 135  K 3.7  CL 94*  CO2 29  GLUCOSE 131*  BUN 21*  CREATININE 2.89*  CALCIUM 9.8   ------------------------------------------------------------------------------------------------------------------  Cardiac Enzymes  Recent Labs Lab 09/04/16 1525  TROPONINI 0.06*   ------------------------------------------------------------------------------------------------------------------  RADIOLOGY:  Dg Chest 1 View  Result Date: 09/04/2016 CLINICAL DATA:  Initial evaluation for worsening shortness of breath. History of CHF. EXAM: CHEST 1 VIEW COMPARISON:  None. FINDINGS: Right-sided hemodialysis catheter in place. Median sternotomy wires underlying CABG markers noted. Moderate cardiomegaly. Mediastinal silhouette normal. Aortic atherosclerosis. Lungs are hypoinflated with elevation left hemidiaphragm. Diffuse pulmonary vascular congestion with interstitial prominence, suggesting mild pulmonary interstitial edema. Small bilateral pleural effusions. No definite focal infiltrates. No pneumothorax. No acute osseus abnormality. IMPRESSION: 1. Cardiomegaly with mild diffuse pulmonary interstitial edema and small bilateral pleural effusions, suggesting mild CHF. 2. Aortic atherosclerosis. 3. Sequelae of prior CABG. Electronically Signed   By: Jeannine Boga M.D.   On: 09/04/2016 15:51    EKG:   Orders placed or performed during the hospital encounter of 10/03/15  . EKG 12-Lead  . EKG 12-Lead   Normal  sinus rhythm 82 bpm, no ST T changes. IMPRESSION AND PLAN:  66 year old female patient with ESRD on hemodialysis, diabetes mellitus type 2, essential hypertension  With  chest pressure with risk factors of CABG in year ago admitted to hospitalist service on telemetry.  #1. Chest pain and shortness of breath in a patient with risk factors of CABG, diabetes, ESRD. Admitted to hospitalist service on telemetry, patient pressure   Relieved with nitroglycerin given in the emergency room. Does have slightly elevated troponins of 0.06. Cycle the troponins 2 more times, monitor on telemetry, continue aspirin, small dose beta blockers, nitrates and obtain cardiology consult.f/u [facility] cardiology Essential hypertension #3 hyperlipidemia Diabetes mellitus type 2. Continue Lantus. Check sliding scale with coverage  All  the records are reviewed and case discussed with ED provider. Management plans discussed with the patient, family and they are in agreement.  CODE STATUS: full  TOTAL TIME TAKING CARE OF THIS PATIENT: 55 minutes.    Epifanio Lesches M.D on 09/04/2016 at 5:12 PM  Between 7am to 6pm - Pager - [phone]  After 6pm go to [email or web address] - password [email or web address] [facility]  Office  [phone]  CC: Primary care physician; System, Pcp Not In  Note: This dictation was prepared with Dragon dictation along with smaller phrase technology. Any transcriptional errors that result from this process are unintentional.

## 2016-09-04 NOTE — ED Provider Notes (Signed)
[facility] Emergency Department Provider Note  ____________________________________________   First MD Initiated Contact with Patient 09/04/16 1523     (approximate)  I have reviewed the triage vital signs and the nursing notes.   HISTORY  Chief Complaint Chest Pain    HPI Stephanie Ellis is a 66 y.o. female with a history of end-stage renal disease on dialysis who is presenting to the emergency department today with chest pressure and shortness of breath. She says that she had about an hour and a half of dialysis before she needed to come off the machine. She says she was having chest pressure during her dialysis treatment today and then shortness of breath. She was taken off of her dialysis machine and was sent to the hospital still with the bouts catheter in her left upper extremity fistula. She reports a 5 out of 10 pressure to the center of the chest right now without any radiation. Says that she had a history about one year ago of cardiac bypass. Said that at the time she was feeling similar chest pressure and did not have any "pain."   Past Medical History:  Diagnosis Date  . Anemia   . Anorexia   . Bacteremia due to Pseudomonas   . CHF (congestive heart failure) ([place])   . Complication of anesthesia    WITH LAST STENT 10/17. PASED OUT AND HAD TO BE AWAKENED  . Coronary artery disease involving left main coronary artery 01/2015   [facility]: 70% LM, p-mLAD 50-60% (Resting FFR 0.75), mRCA 80-90%, ~40 Ost OM & D1  . Depression   . Dysrhythmia   . ESRD (end stage renal disease) on dialysis [facility])    ESRD secondary to acute kidney failure s/p CABG  DIALYSIS M/W/F  . Essential hypertension   . GERD (gastroesophageal reflux disease)   . Heart murmur   . Hypercholesterolemia   . Myocardial infarction ([place])   . 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 Marland Kitchen Haithcock: CABG x 3, LIMA to LAD, SVG to RCA, SVG to OM3, EVH  . Type II diabetes mellitus with  complication [facility])    CAD    Patient Active Problem List   Diagnosis Date Noted  . Steal syndrome dialysis vascular access, initial encounter ([place]) 06/18/2016  . Hypertension 02/02/2016  . Hyperlipidemia 02/02/2016  . Coronary artery disease involving left main coronary artery 10/04/2015  . Abdominal pain of unknown etiology 10/04/2015  . ESRD (end stage renal disease) on dialysis ([place])   . Type II diabetes mellitus with complication ([place])   . NSTEMI (non-ST elevated myocardial infarction) ([place])   . Right sided abdominal pain   . Elevated troponin 10/03/2015  . 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 ABDOMINAL HYSTERECTOMY    . AV FISTULA PLACEMENT Left 05/30/2016   Procedure: ARTERIOVENOUS graft;  Surgeon: Algernon Huxley, MD;  Location: [facility] ORS;  Service: Vascular;  Laterality: Left;  . CARDIAC CATHETERIZATION  01/2015   [facility]: Ost LM 70%, p-m LAD 50-60% (Rest FFR + @ 0.75), mRCA 80-90%, ostD1 40%, pOM1 40%  . CATARACT EXTRACTION W/PHACO Left 01/18/2016   Procedure: CATARACT EXTRACTION PHACO AND INTRAOCULAR LENS PLACEMENT (IOC);  Surgeon: Eulogio Bear, MD;  Location: [facility] ORS;  Service: Ophthalmology;  Laterality: Left;  [place] 1.05 AP% 15.5 CDE 10.16 Fluid Pack Lot # [id number]  . CATARACT EXTRACTION W/PHACO Right 08/01/2016   Procedure: CATARACT EXTRACTION PHACO AND  INTRAOCULAR LENS PLACEMENT (IOC);  Surgeon: Eulogio Bear, MD;  Location: [facility] ORS;  Service: Ophthalmology;  Laterality: Right;  [place] 01:00.6 AP% 11.4 CDE 6.93  LOT # [id number]  . COLONOSCOPY    . CORONARY ANGIOPLASTY     SENTS 02/12/16  . CORONARY ARTERY BYPASS GRAFT  03/28/15    [facility]: Dr. Waldemar Dickens: LIMA to LAD, SVG to RCA, SVG to OM3, EVH  . EYE SURGERY    . INSERTION EXPRESS TUBE SHUNT Right 08/01/2016   Procedure: INSERTION EXPRESS TUBE SHUNT;  Surgeon: Eulogio Bear, MD;  Location: [facility] ORS;  Service: Ophthalmology;  Laterality: Right;  . INSERTION OF AHMED VALVE  Left 08/15/2016   Procedure: INSERTION OF AHMED VALVE;  Surgeon: Eulogio Bear, MD;  Location: [facility] ORS;  Service: Ophthalmology;  Laterality: Left;  . INSERTION OF DIALYSIS CATHETER    . TRANSTHORACIC ECHOCARDIOGRAM  January 2017    EF 60-65%. GR 2 DD. Mild degenerative mitral valve disease but no prolapse or regurgitation. Mild left atrial dilation. Mild to moderate LVH. Pericardial effusion gone  . UPPER EXTREMITY ANGIOGRAPHY Left 06/27/2016   Procedure: Upper Extremity Angiography;  Surgeon: Algernon Huxley, MD;  Location: [place] CV LAB;  Service: Cardiovascular;  Laterality: Left;  . UPPER EXTREMITY ANGIOGRAPHY Left 08/22/2016   Procedure: Upper Extremity Angiography;  Surgeon: Algernon Huxley, MD;  Location: [place] [facility];  Service: Cardiovascular;  Laterality: Left;    Prior to Admission medications   Medication Sig Start Date End Date Taking? Authorizing Provider  aspirin 81 MG chewable tablet Chew 81 mg by mouth daily.    [provider]  atorvastatin (LIPITOR) 40 MG tablet Take 40 mg by mouth daily at 6 PM.     [provider]  Calcium Acetate, Phos Binder, (PHOSLO PO) Take by mouth.    [provider]  cholecalciferol (VITAMIN D) 400 units TABS tablet Take 400 Units by mouth daily.     [provider]  cinacalcet (SENSIPAR) 30 MG tablet Take 30 mg by mouth daily.     [provider]  GABAPENTIN PO Take by mouth.    [provider]  insulin glargine (LANTUS) 100 unit/mL SOPN Inject 14 Units into the skin every evening.     [provider]  loperamide (IMODIUM) 2 MG capsule Take 2 mg by mouth 2 (two) times daily as needed for diarrhea or loose stools.    [provider]  Loratadine 5 MG TBDP Take 1 tablet (5 mg total) by mouth every morning. 07/26/16   Merlyn Lot, MD  Multiple Vitamin (MULTIVITAMIN WITH MINERALS) TABS tablet Take 1 tablet by mouth daily.    [provider]  neomycin-polymyxin  b-dexamethasone (MAXITROL) 3.5-10000-0.1 SUSP Place 2 drops into both eyes every 6 (six) hours. 07/26/16   Merlyn Lot, MD  ondansetron (ZOFRAN) 4 MG tablet Take 4 mg by mouth every 8 (eight) hours as needed for nausea or vomiting.    [provider]  polyvinyl alcohol (LIQUIFILM TEARS) 1.4 % ophthalmic solution Place 2 drops into both eyes daily as needed for dry eyes.     [provider]  sevelamer carbonate (RENVELA) 800 MG tablet Take 800 mg by mouth 3 (three) times daily with meals.    [provider]    Allergies Chlorthalidone; Fentanyl; Midazolam; Ace inhibitors; Angiotensin receptor blockers; and Norvasc [amlodipine]  Family History  Problem Relation Age of Onset  . Diabetes Mellitus II Mother     Social History  Social History  Substance Use Topics  . Smoking status: Never Smoker  . Smokeless tobacco: Never Used  . Alcohol use No    Review of Systems  Constitutional: No fever/chills Eyes: No visual changes. ENT: No sore throat. Cardiovascular:as above. Respiratory:as above. Gastrointestinal: No abdominal pain.  No nausea, no vomiting.  No diarrhea.  No constipation. Genitourinary: Negative for dysuria. Musculoskeletal: Negative for back pain. Skin: Negative for rash. Neurological: Negative for headaches, focal weakness or numbness.   ____________________________________________   PHYSICAL EXAM:  VITAL SIGNS: ED Triage Vitals  Enc Vitals Group     BP 09/04/16 1522 (!) 190/78     Pulse Rate 09/04/16 1522 80     Resp 09/04/16 1522 17     Temp 09/04/16 1522 97.9 F (36.6 C)     Temp Source 09/04/16 1522 Oral     SpO2 09/04/16 1522 100 %     Weight 09/04/16 1520 127 lb (57.6 kg)     Height 09/04/16 1520 5' 4"  (1.626 m)     Head Circumference --      Peak Flow --      Pain Score 09/04/16 1519 5     Pain Loc --      Pain Edu? --      Excl. in [place]? --     Constitutional: Alert and oriented. Well appearing and in no acute  distress. Eyes: Conjunctivae are normal.  Head: Atraumatic. Nose: No congestion/rhinnorhea. Mouth/Throat: Mucous membranes are moist.  Neck: No stridor.   Cardiovascular: Normal rate, regular rhythm. Grossly normal heart sounds.  Good peripheral circulation With pulsatile fistula to the left upper extremity. Respiratory: Normal respiratory effort.  No retractions. Mild Rales to the bilateral lower and mid fields. Gastrointestinal: Soft and nontender. No distention. No CVA tenderness. Musculoskeletal: No lower extremity tenderness nor edema.  No joint effusions. Neurologic:  Normal speech and language. No gross focal neurologic deficits are appreciated. Skin:  Skin is warm, dry and intact. No rash noted. Psychiatric: Mood and affect are normal. Speech and behavior are normal.  ____________________________________________   LABS (all labs ordered are listed, but only abnormal results are displayed)  Labs Reviewed  CBC WITH DIFFERENTIAL/PLATELET - Abnormal; Notable for the following:       Result Value   RDW 20.6 (*)    All other components within normal limits  BASIC METABOLIC PANEL - Abnormal; Notable for the following:    Chloride 94 (*)    Glucose, Bld 131 (*)    BUN 21 (*)    Creatinine, Ser 2.89 (*)    GFR calc non Af Amer 16 (*)    GFR calc Af Amer 19 (*)    All other components within normal limits  TROPONIN I - Abnormal; Notable for the following:    Troponin I 0.06 (*)    All other components within normal limits  BRAIN NATRIURETIC PEPTIDE - Abnormal; Notable for the following:    B Natriuretic Peptide 4,123.0 (*)    All other components within normal limits   ____________________________________________  EKG   ED ECG REPORT I, Doran Stabler, the attending physician, personally viewed and interpreted this ECG.   Date: 09/04/2016  EKG Time: 1520  Rate: 82  Rhythm: normal sinus rhythm  Axis: Normal  Intervals:iRBBB with LAFB  ST&T Change: Borderline  depression to the lateral leads. No ST elevations. No significant change from EKG of 10/03/2015.  ____________________________________________  RADIOLOGY  IMPRESSION: 1. Cardiomegaly with mild diffuse pulmonary interstitial  edema and small bilateral pleural effusions, suggesting mild CHF. 2. Aortic atherosclerosis. 3. Sequelae of prior CABG.   Electronically Signed By: Jeannine Boga M.D. On: 09/04/2016 15:51 ____________________________________________   PROCEDURES  Procedure(s) performed:   Procedures  Critical Care performed:   ____________________________________________   INITIAL IMPRESSION / ASSESSMENT AND PLAN / ED COURSE  Pertinent labs & imaging results that were available during my care of the patient were reviewed by me and considered in my medical decision making (see chart for details).  ----------------------------------------- 4:41 PM on 09/04/2016 -----------------------------------------  After 2 nitroglycerin tabs the patient no longer has any chest pressure. Found to have slightly elevated troponin of 0.06. Possibly related to her decreased renal function at her baseline. However, her symptoms are very similar to previous MIs. She'll be admitted to the hospital. Signed out to Dr. Andee Poles.  Patient as well as her daughter, who is at the bedside, are aware of the diagnosis as well as the plan for admission.      ____________________________________________   FINAL CLINICAL IMPRESSION(S) / ED DIAGNOSES  Chest pain.    NEW MEDICATIONS STARTED DURING THIS VISIT:  New Prescriptions   No medications on file     Note:  This document was prepared using Dragon voice recognition software and may include unintentional dictation errors.     Orbie Pyo, MD 09/04/16 [phone]

## 2016-09-05 DIAGNOSIS — R0789 Other chest pain: Secondary | ICD-10-CM | POA: Diagnosis not present

## 2016-09-05 DIAGNOSIS — R748 Abnormal levels of other serum enzymes: Secondary | ICD-10-CM

## 2016-09-05 DIAGNOSIS — T8859XA Other complications of anesthesia, initial encounter: Secondary | ICD-10-CM

## 2016-09-05 DIAGNOSIS — T4145XA Adverse effect of unspecified anesthetic, initial encounter: Secondary | ICD-10-CM

## 2016-09-05 DIAGNOSIS — R079 Chest pain, unspecified: Secondary | ICD-10-CM | POA: Diagnosis not present

## 2016-09-05 LAB — BASIC METABOLIC PANEL
Anion gap: 8 (ref 5–15)
BUN: 34 mg/dL — ABNORMAL HIGH (ref 6–20)
CO2: 31 mmol/L (ref 22–32)
Calcium: 9.2 mg/dL (ref 8.9–10.3)
Chloride: 96 mmol/L — ABNORMAL LOW (ref 101–111)
Creatinine, Ser: 4.26 mg/dL — ABNORMAL HIGH (ref 0.44–1.00)
GFR calc Af Amer: 12 mL/min — ABNORMAL LOW (ref >60)
GFR calc non Af Amer: 10 mL/min — ABNORMAL LOW (ref >60)
Glucose, Bld: 147 mg/dL — ABNORMAL HIGH (ref 65–99)
Potassium: 4.1 mmol/L (ref 3.5–5.1)
Sodium: 135 mmol/L (ref 135–145)

## 2016-09-05 LAB — CBC
HCT: 34.2 % — ABNORMAL LOW (ref 35.0–47.0)
Hemoglobin: 11.2 g/dL — ABNORMAL LOW (ref 12.0–16.0)
MCH: 30.4 pg (ref 26.0–34.0)
MCHC: 32.6 g/dL (ref 32.0–36.0)
MCV: 93.1 fL (ref 80.0–100.0)
Platelets: 172 10*3/uL (ref 150–440)
RBC: 3.68 MIL/uL — ABNORMAL LOW (ref 3.80–5.20)
RDW: 20.4 % — ABNORMAL HIGH (ref 11.5–14.5)
WBC: 7.3 10*3/uL (ref 3.6–11.0)

## 2016-09-05 LAB — GLUCOSE, CAPILLARY: Glucose-Capillary: 116 mg/dL — ABNORMAL HIGH (ref 65–99)

## 2016-09-05 LAB — TROPONIN I
Troponin I: 0.07 ng/mL (ref <0.03)
Troponin I: 0.06 ng/mL (ref <0.03)

## 2016-09-05 MED ORDER — CLOPIDOGREL BISULFATE 75 MG PO TABS
75.0000 mg | ORAL_TABLET | Freq: Every day | ORAL | Status: DC
  Administered 2016-09-05: 75 mg via ORAL
  Filled 2016-09-05: qty 1

## 2016-09-05 MED ORDER — METOPROLOL TARTRATE 25 MG PO TABS: ORAL_TABLET | ORAL | 0 refills | Status: DC

## 2016-09-05 MED ORDER — NITROGLYCERIN 0.4 MG SL SUBL: 0.4000 mg | SUBLINGUAL_TABLET | SUBLINGUAL | 0 refills | Status: DC | PRN

## 2016-09-05 MED ORDER — ISOSORBIDE MONONITRATE ER 30 MG PO TB24: 30.0000 mg | ORAL_TABLET | Freq: Every day | ORAL | 0 refills | Status: DC

## 2016-09-05 MED ORDER — FUROSEMIDE 10 MG/ML IJ SOLN
80.0000 mg | Freq: Once | INTRAMUSCULAR | Status: AC
  Administered 2016-09-05: 80 mg via INTRAVENOUS
  Filled 2016-09-05: qty 8

## 2016-09-05 MED ORDER — CLOPIDOGREL BISULFATE 75 MG PO TABS: 75.0000 mg | ORAL_TABLET | Freq: Every day | ORAL | 0 refills | Status: DC

## 2016-09-05 MED ORDER — FAMOTIDINE 20 MG PO TABS: 20.0000 mg | ORAL_TABLET | Freq: Two times a day (BID) | ORAL | 0 refills | Status: DC

## 2016-09-05 MED ORDER — ISOSORBIDE MONONITRATE ER 30 MG PO TB24
30.0000 mg | ORAL_TABLET | Freq: Every day | ORAL | Status: DC
  Administered 2016-09-05: 30 mg via ORAL
  Filled 2016-09-05: qty 1

## 2016-09-05 NOTE — Progress Notes (Signed)
Patient alert and oriented, vss, no complaints of pain.  Went over new medications with patient.  She repeats back information.  No questions at this time.   D/C telemetry and d/c PIV.  Given Rx to patient.  Patient to be escorted out of hospital via wheelchair by volunteers.

## 2016-09-05 NOTE — Consult Note (Signed)
Cardiology Consultation Note  Patient ID: Stephanie Ellis, MRN: [id number], DOB/AGE: 01-13-1951 66 y.o. Admit date: 09/04/2016   Date of Consult: 09/05/2016 Primary Physician: Stephanie Ellis, Pcp Not In Primary Cardiologist: [facility] Requesting Physician: Dr. Vianne Bulls, MD  Chief Complaint: Chest pain Reason for Consult: Elevated troponin  HPI: Stephanie Ellis is a 66 y.o. female who is being seen today for the evaluation of elevated troponin at the request of Dr. Vianne Bulls, MD. Patient has a h/o CAD s/p CABG on 03/24/2015 with LIMA-LAD, SVG-RCA, SVG-LCx, at [facility] followed by NSTEMI on 02/09/2016 with PCI x 2 to the LCx with refractory chest pain on 10/23 taken back to the cath lab for staged PCI to the RCA complicated by sever hypotension with SBP in the 50s and agonal breathing requiring nasal trumpet and anesthesia involvement to manage airway with bag valve mask with recommendation to avoid further sedation and medically manage her CAD. She also has history of chronic diastolic CHF, ESRD on HD (MWF), DM, HTN, and HLD who presented to [facility] on 5/16 with 1 hour of chest pain pccuring during HD with SBP in the 190s.   She is followed by [facility] cardiology and was last seen in 03/2016 and doing well at that time. It was noted she had been off Plavix x 2 weeks at that time and was confused about some of her BP medications. Since then, she reports full compliance with her Plavix and ASA along with her antihypertensive medications.  During HD on 5/16 she developed sharp lower substernal chest pain to epigastric pain. Pain was not similar to her prior MI. Pain lasted approximately 1 hour and resolved. Pain was not reproducible to palpation, though she is uncertain if it was worse with coughing or deep inspiration. She is uncertain if this was "chest pain." No palpitations, SOB, nausea, or vomiting.   Her volume is managed by HD MWF. She is anuric. She is uncertain what her dry weight is. She denies any changes to her 2-pillow  orthopnea. No early satiety or LE swelling.   Upon the patient's arrival to [facility] they were found to have BP 190/78, HR 80 bpm, temp 97.9, oxygen saturation 100% on room air, weight 127 pounds. EKG not acute as below, CXR showed cardiomegaly with mild diffuse pulmonary interstitial edema and small bilateral pleural effusions. Labs showed BNP > 4000, troponin 0.06-->0.06-->0.07. BNP 2.89-->4.26, K+ 3.7-->4.1, WBC 7.4, HGB 12.7, PLT 197. She was given ASA 243 mg in the ED along with nitro paste being applied. She has remained chest pain free.   Past Medical History:  Diagnosis Date  . Anemia   . Anorexia   . Bacteremia due to Pseudomonas   . CHF (congestive heart failure) ([place])   . Complication of anesthesia    WITH LAST STENT 10/17. PASED OUT AND HAD TO BE AWAKENED  . Coronary artery disease involving left main coronary artery 01/2015   [facility]: 70% LM, p-mLAD 50-60% (Resting FFR 0.75), mRCA 80-90%, ~40 Ost OM & D1  . Depression   . Dysrhythmia   . ESRD (end stage renal disease) on dialysis Stephanie Ellis)    ESRD secondary to acute kidney failure s/p CABG  DIALYSIS M/W/F  . Essential hypertension   . GERD (gastroesophageal reflux disease)   . Heart murmur   . Hypercholesterolemia   . Myocardial infarction ([place])   . S/P CABG x 3 03/24/2015    [facility]: Stephanie Ellis: CABG x 3, LIMA to LAD, SVG to RCA, SVG to OM3, EVH  .  Type II diabetes mellitus with complication (HCC)    CAD      Most Recent Cardiac Studies: LHC 01/2016: Hemodynamics: LVedp= 17 mm Hg  Angiography: LVgram: not performed LCA: 50% distal L Main, total mid LAD, 99% proximal LCFx stenosis, 99%  dist LCFx stenosis. 99% small marginal branch stenosis RCA: multiple high grade 90 - 95% stenoses LIMA LAD - patent to mid-distal LAD SVG RCA -4 serial 99% discrete lesions located from proximal to distal  SVG to anastomosis with PDA  Impression:  CAD as described above Plan:  Reviewed with Interventional Cardiology, will  proceed with PCI to proximal  LCFx and possibly distal LCFx lesions today.  Further PCI to RCA based upon response to medical therapy.  Complications:  None  Repeat LHC 02/12/2016 secondary to ongoing pain: Procedure The patient was prepped and draped in the usual sterile fashion. Using a  micropuncture kit, a short 34F sheath was inserted into the right femoral  artery. A JL4 catheter was brought to the ascending aorta for LCA  angiography. However, before that could be performed the patient became  hypotensive (SBP as low as 50), hypoxic, and stopped having spontaneous  breathing. Anesthesia was called and managed airway with nasal trumpet  bag/mask. Narcan and flumazenil were given to reverse sedation (midazolam  1 mg fentanyl 50 mcg). The patient awoke, BP and SPO2 returned to  baseline. The procedure was cancelled due to this reaction. Sheath will be  removed in the holding area and hemostasis achieved with manual  compression.   Findings:  1. Severe hypotension, hypoxia with moderate sedation requiring nasal  airway, narcan/flumazenil 2. PCI cancelled due to above reaction  Recommendations:   Return to ward  Consider medical therapy and intensification of antianginals  Recommend against future moderate sedation; if necessary would attempt  PCI without sedation  TTE 02/09/2016: Limited study to evaluate ventricular function  Left ventricular hypertrophy - mild to moderate  Normal left ventricular systolic function, ejection fraction 55 to 18%  Diastolic dysfunction - grade II (elevated filling pressures)  Degenerative mitral valve disease  Mitral regurgitation - mild  Aortic sclerosis  Normal right ventricular systolic function   Surgical History:  Past Surgical History:  Procedure Laterality Date  . ABDOMINAL HYSTERECTOMY    . AV FISTULA PLACEMENT Left 05/30/2016   Procedure: ARTERIOVENOUS graft;  Surgeon: Algernon Huxley, MD;  Location: [facility] ORS;  Service:  Vascular;  Laterality: Left;  . CARDIAC CATHETERIZATION  01/2015   [facility]: Ost LM 70%, p-m LAD 50-60% (Rest FFR + @ 0.75), mRCA 80-90%, ostD1 40%, pOM1 40%  . CATARACT EXTRACTION W/PHACO Left 01/18/2016   Procedure: CATARACT EXTRACTION PHACO AND INTRAOCULAR LENS PLACEMENT (IOC);  Surgeon: Eulogio Bear, MD;  Location: [facility] ORS;  Service: Ophthalmology;  Laterality: Left;  [place] 1.05 AP% 15.5 CDE 10.16 Fluid Pack Lot # [id number]  . CATARACT EXTRACTION W/PHACO Right 08/01/2016   Procedure: CATARACT EXTRACTION PHACO AND INTRAOCULAR LENS PLACEMENT (IOC);  Surgeon: Eulogio Bear, MD;  Location: [facility] ORS;  Service: Ophthalmology;  Laterality: Right;  [place] 01:00.6 AP% 11.4 CDE 6.93  LOT # [id number]  . COLONOSCOPY    . CORONARY ANGIOPLASTY     SENTS 02/12/16  . CORONARY ARTERY BYPASS GRAFT  03/28/15    [facility]: Dr. Waldemar Dickens: LIMA to LAD, SVG to RCA, SVG to OM3, EVH  . EYE Ellis    . INSERTION EXPRESS TUBE SHUNT Right 08/01/2016   Procedure: INSERTION EXPRESS TUBE SHUNT;  Surgeon: Eulogio Bear,  MD;  Location: [facility] ORS;  Service: Ophthalmology;  Laterality: Right;  . INSERTION OF AHMED VALVE Left 08/15/2016   Procedure: INSERTION OF AHMED VALVE;  Surgeon: Eulogio Bear, MD;  Location: [facility] ORS;  Service: Ophthalmology;  Laterality: Left;  . INSERTION OF DIALYSIS CATHETER    . TRANSTHORACIC ECHOCARDIOGRAM  January 2017    EF 60-65%. GR 2 DD. Mild degenerative mitral valve disease but no prolapse or regurgitation. Mild left atrial dilation. Mild to moderate LVH. Pericardial effusion gone  . UPPER EXTREMITY ANGIOGRAPHY Left 06/27/2016   Procedure: Upper Extremity Angiography;  Surgeon: Algernon Huxley, MD;  Location: [facility];  Service: Cardiovascular;  Laterality: Left;  . UPPER EXTREMITY ANGIOGRAPHY Left 08/22/2016   Procedure: Upper Extremity Angiography;  Surgeon: Algernon Huxley, MD;  Location: [facility];  Service: Cardiovascular;  Laterality: Left;     Home Meds: Prior to  Admission medications   Medication Sig Start Date End Date Taking? Authorizing Provider  aspirin 81 MG chewable tablet Chew 81 mg by mouth daily.   Yes [provider]  atorvastatin (LIPITOR) 40 MG tablet Take 40 mg by mouth daily at 6 PM.    Yes [provider]  Calcium Acetate, Phos Binder, (PHOSLO PO) Take by mouth.   Yes [provider]  cholecalciferol (VITAMIN D) 400 units TABS tablet Take 400 Units by mouth daily.    Yes [provider]  insulin glargine (LANTUS) 100 unit/mL SOPN Inject 7 Units into the skin every evening.    Yes [provider]  loperamide (IMODIUM) 2 MG capsule Take 2 mg by mouth 2 (two) times daily as needed for diarrhea or loose stools.   Yes [provider]  Multiple Vitamin (MULTIVITAMIN WITH MINERALS) TABS tablet Take 1 tablet by mouth daily.   Yes [provider]  ondansetron (ZOFRAN) 4 MG tablet Take 4 mg by mouth every 8 (eight) hours as needed for nausea or vomiting.   Yes [provider]  polyvinyl alcohol (LIQUIFILM TEARS) 1.4 % ophthalmic solution Place 2 drops into both eyes daily as needed for dry eyes.    Yes [provider]  sevelamer carbonate (RENVELA) 800 MG tablet Take 800 mg by mouth 3 (three) times daily with meals.   Yes [provider]  Loratadine 5 MG TBDP Take 1 tablet (5 mg total) by mouth every morning. Patient not taking: Reported on 09/04/2016 07/26/16   Merlyn Lot, MD  neomycin-polymyxin b-dexamethasone (MAXITROL) 3.5-10000-0.1 SUSP Place 2 drops into both eyes every 6 (six) hours. Patient not taking: Reported on 09/04/2016 07/26/16   Merlyn Lot, MD    Inpatient Medications:  . aspirin EC  81 mg Oral Daily  . atorvastatin  40 mg Oral q1800  . cholecalciferol  400 Units Oral Daily  . docusate sodium  100 mg Oral BID  . heparin  5,000 Units Subcutaneous Q8H  . insulin glargine  7 Units Subcutaneous QPM  . isosorbide mononitrate  30 mg Oral  Daily  . metoprolol tartrate  25 mg Oral BID  . multivitamin with minerals  1 tablet Oral Daily  . sevelamer carbonate  800 mg Oral TID WC  . sodium chloride flush  3 mL Intravenous Q12H     Allergies:  Allergies  Allergen Reactions  . Chlorthalidone Anaphylaxis, Itching and Rash  . Fentanyl Rash  . Midazolam Rash  . Ace Inhibitors Other (See Comments)    Reaction:  Hyperkalemia, agitation   . Angiotensin Receptor  Blockers Other (See Comments)    Reaction:  Hyperkalemia, agitation   . Norvasc [Amlodipine] Itching and Rash    Social History   Social History  . Marital status: Single    Spouse name: N/A  . Number of children: N/A  . Years of education: N/A   Occupational History  . Not on file.   Social History Main Topics  . Smoking status: Never Smoker  . Smokeless tobacco: Never Used  . Alcohol use No  . Drug use: No  . Sexual activity: No   Other Topics Concern  . Not on file   Social History Narrative  . No narrative on file     Family History  Problem Relation Age of Onset  . Diabetes Mellitus II Mother      Review of Systems: Review of Systems  Constitutional: Positive for malaise/fatigue. Negative for chills, diaphoresis, fever and weight loss.  HENT: Negative for congestion.   Eyes: Negative for discharge and redness.  Respiratory: Negative for cough, hemoptysis, sputum production, shortness of breath and wheezing.   Cardiovascular: Positive for chest pain. Negative for palpitations, orthopnea, claudication, leg swelling and PND.  Gastrointestinal: Negative for abdominal pain, blood in stool, heartburn, melena, nausea and vomiting.  Genitourinary: Negative for hematuria.  Musculoskeletal: Negative for falls and myalgias.  Skin: Negative for rash.  Neurological: Positive for weakness. Negative for dizziness, tingling, tremors, sensory change, speech change, focal weakness and loss of consciousness.  Endo/Heme/Allergies: Does not bruise/bleed  easily.  Psychiatric/Behavioral: Negative for substance abuse. The patient is not nervous/anxious.   All other systems reviewed and are negative.   Labs:  Recent Labs  09/04/16 1525 09/04/16 2131 09/05/16 0344  TROPONINI 0.06* 0.06* 0.07*   Lab Results  Component Value Date   WBC 7.3 09/05/2016   HGB 11.2 (L) 09/05/2016   HCT 34.2 (L) 09/05/2016   MCV 93.1 09/05/2016   PLT 172 09/05/2016     Recent Labs Lab 09/05/16 0344  NA 135  K 4.1  CL 96*  CO2 31  BUN 34*  CREATININE 4.26*  CALCIUM 9.2  GLUCOSE 147*   No results found for: CHOL, HDL, LDLCALC, TRIG No results found for: DDIMER  Radiology/Studies:  Dg Chest 1 View  Result Date: 09/04/2016 CLINICAL DATA:  Initial evaluation for worsening shortness of breath. History of CHF. EXAM: CHEST 1 VIEW COMPARISON:  None. FINDINGS: Right-sided hemodialysis catheter in place. Median sternotomy wires underlying CABG markers noted. Moderate cardiomegaly. Mediastinal silhouette normal. Aortic atherosclerosis. Lungs are hypoinflated with elevation left hemidiaphragm. Diffuse pulmonary vascular congestion with interstitial prominence, suggesting mild pulmonary interstitial edema. Small bilateral pleural effusions. No definite focal infiltrates. No pneumothorax. No acute osseus abnormality. IMPRESSION: 1. Cardiomegaly with mild diffuse pulmonary interstitial edema and small bilateral pleural effusions, suggesting mild CHF. 2. Aortic atherosclerosis. 3. Sequelae of prior CABG. Electronically Signed   By: Jeannine Boga M.D.   On: 09/04/2016 15:51    EKG: Interpreted by me showed: NSR, 82 bpm, left anterior fascicular block, incomplete RBBB Telemetry: Interpreted by me showed: NSR, 60s bpm  Weights: Filed Weights   09/04/16 1520 09/05/16 0354  Weight: 127 lb (57.6 kg) 127 lb 12.8 oz (58 kg)     Physical Exam: Blood pressure (!) 142/58, pulse 68, temperature 98.1 F (36.7 C), temperature source Oral, resp. rate 18, height  _0  (1.626 m), weight 127 lb 12.8 oz (58 kg), SpO2 96 %. Body mass index is 21.94 kg/m. General: Frail appearing, in no acute distress. Head:  Normocephalic, atraumatic, sclera non-icteric, no xanthomas, nares are without discharge.  Neck: Negative for carotid bruits. JVD not elevated. Lungs: Clear bilaterally to auscultation without wheezes, rales, or rhonchi. Breathing is unlabored. Heart: RRR with S1 S2. No murmurs, rubs, or gallops appreciated. Palpation does not reproduce her pain.  Abdomen: Soft, non-tender, non-distended with normoactive bowel sounds. No hepatomegaly. No rebound/guarding. No obvious abdominal masses. Msk:  Strength and tone appear normal for age. Extremities: No clubbing or cyanosis. No edema. Distal pedal pulses are 2+ and equal bilaterally. Neuro: Alert and oriented X 3. No facial asymmetry. No focal deficit. Moves all extremities spontaneously. Psych:  Responds to questions appropriately with a normal affect.    Assessment and Plan:  Principal Problem:   Chest pain with moderate risk for cardiac etiology Active Problems:   Coronary artery disease involving left main coronary artery   S/P CABG x 3   Anesthesia complication   Elevated troponin   ESRD (end stage renal disease) on dialysis (HCC)   Type II diabetes mellitus with complication ([place])    1. Chest pain with moderate risk of cardiac etiology/elevated troponin/CAD as above: -Currently chest pain free -Minimally elevated troponin of 0.07 in the setting of hypertensive urgency and ESRD on HD -Pain did not feel like prior NSTEMI in 01/2016 -No acute EKG changes -During staged PCI in 01/2016 at [facility], The patient became severely hypotensive with SBP in the 50s and agonal requiring anesthesia to assist in airway management with nasal trumpet and bag valve mask. She improved with Narcan and flumazenil. Given this severe reaction, it was recommend she undergo medical management for her residual RCA disease, not  undergo any further sedation, and only go back to the cath lab if absolutely necessary. She reports having undergone several vascular procedures since with a similar complication. She prefers to not undergo any invasive testing at this time and wants to follow up with [facility] -Add Imdur 30 mg daily -Continue metoprolol 25 mg bid and Lipitor -Patient was not continued on her Plavix upon admission for unclear reasons, I will restart this -Continue ASA, Plavix per primary cardiology  -Follow up with [facility]  2. Complication of anesthesia: -As above  3. Chronic diastolic CHF/elevated BNP: -No symptoms of volume overload -BNP > 4000 -Volume managed by HD, has not missed any session though ehr most recent session was cut in half 2/2 chest pain as above -Check TTE  4. ESRD: -On HD MWF  4. HTN: -Improving -Imdur added as above -Lopressor as above   Signed, Christell Faith, PA-C [facility] Pager: [phone] 09/05/2016, 8:48 AM

## 2016-09-05 NOTE — Discharge Summary (Signed)
[facility] [place] NAME: Stephanie Ellis    MR#:  [id number]  DATE OF BIRTH:  1951-02-02  DATE OF ADMISSION:  09/04/2016 ADMITTING PHYSICIAN: Epifanio Lesches, MD  DATE OF DISCHARGE: 09/05/2016 11:55 AM  PRIMARY CARE PHYSICIAN: Dr Etheleen Mayhew   ADMISSION DIAGNOSIS:  Chest pain, unspecified type [R07.9]  DISCHARGE DIAGNOSIS:  Chest pain unspecified  SECONDARY DIAGNOSIS:   Past Medical History:  Diagnosis Date  . Anemia   . Anorexia   . Bacteremia due to Pseudomonas   . CHF (congestive heart failure) ([place])   . Complication of anesthesia    WITH LAST STENT 10/17. PASED OUT AND HAD TO BE AWAKENED  . Coronary artery disease involving left main coronary artery 01/2015   [facility]: 70% LM, p-mLAD 50-60% (Resting FFR 0.75), mRCA 80-90%, ~40 Ost OM & D1  . Depression   . Dysrhythmia   . ESRD (end stage renal disease) on dialysis [facility])    ESRD secondary to acute kidney failure s/p CABG  DIALYSIS M/W/F  . Essential hypertension   . GERD (gastroesophageal reflux disease)   . Heart murmur   . Hypercholesterolemia   . Myocardial infarction ([place])   . 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 Marland Kitchen Haithcock: CABG x 3, LIMA to LAD, SVG to RCA, SVG to OM3, EVH  . Type II diabetes mellitus with complication ([place])    CAD    HOSPITAL COURSE:   1.  Chest pain and borderline elevated troponin. History of coronary artery disease. This is likely false positive with end-stage renal disease. This is not a myocardial infarction. The patient complains of lots of gas and belching. This could be acid reflux. The patient was seen by cardiology and no further testing was done. Aspirin, Plavix, metoprolol and Imdur prescribed. Follow-up with her cardiologist as outpatient. 2. Accelerated hypertension on presentation. Blood pressure better upon discharge. Adding Imdur. 3. Chronic diastolic congestive heart failure. Dialysis to remove fluid. Since the patient had  a half dialysis session yesterday, I will give a dose of 80 mg IV Lasix today. 4. End-stage renal disease on dialysis. Patient must go to dialysis tomorrow. 5. Type 2 diabetes mellitus without complication. Continue usual medications 6. Hyperlipidemia unspecified on atorvastatin 7. GERD and lots of belching. Pepcid added  DISCHARGE CONDITIONS:   Satisfactory  CONSULTS OBTAINED:  Treatment Team:  Wellington Hampshire, MD  DRUG ALLERGIES:   Allergies  Allergen Reactions  . Chlorthalidone Anaphylaxis, Itching and Rash  . Fentanyl Rash  . Midazolam Rash  . Ace Inhibitors Other (See Comments)    Reaction:  Hyperkalemia, agitation   . Angiotensin Receptor Blockers Other (See Comments)    Reaction:  Hyperkalemia, agitation   . Norvasc [Amlodipine] Itching and Rash    DISCHARGE MEDICATIONS:   Discharge Medication List as of 09/05/2016 10:22 AM    START taking these medications   Details  clopidogrel (PLAVIX) 75 MG tablet Take 1 tablet (75 mg total) by mouth daily., Starting Thu 09/05/2016, Print    famotidine (PEPCID) 20 MG tablet Take 1 tablet (20 mg total) by mouth 2 (two) times daily., Starting Thu 09/05/2016, Print    isosorbide mononitrate (IMDUR) 30 MG 24 hr tablet Take 1 tablet (30 mg total) by mouth daily., Starting Thu 09/05/2016, Print    metoprolol tartrate (LOPRESSOR) 25 MG tablet 1/2 tablet po twice a day, Print    nitroGLYCERIN (NITROSTAT) 0.4 MG SL tablet Place 1 tablet (0.4 mg  total) under the tongue every 5 (five) minutes as needed for chest pain., Starting Thu 09/05/2016, Print      CONTINUE these medications which have NOT CHANGED   Details  aspirin 81 MG chewable tablet Chew 81 mg by mouth daily., Historical Med    atorvastatin (LIPITOR) 40 MG tablet Take 40 mg by mouth daily at 6 PM. , Historical Med    cholecalciferol (VITAMIN D) 400 units TABS tablet Take 400 Units by mouth daily. , Historical Med    insulin glargine (LANTUS) 100 unit/mL SOPN Inject 7 Units  into the skin every evening. , Historical Med    loperamide (IMODIUM) 2 MG capsule Take 2 mg by mouth 2 (two) times daily as needed for diarrhea or loose stools., Historical Med    Multiple Vitamin (MULTIVITAMIN WITH MINERALS) TABS tablet Take 1 tablet by mouth daily., Historical Med    ondansetron (ZOFRAN) 4 MG tablet Take 4 mg by mouth every 8 (eight) hours as needed for nausea or vomiting., Historical Med    polyvinyl alcohol (LIQUIFILM TEARS) 1.4 % ophthalmic solution Place 2 drops into both eyes daily as needed for dry eyes. , Historical Med    sevelamer carbonate (RENVELA) 800 MG tablet Take 800 mg by mouth 3 (three) times daily with meals., Historical Med      STOP taking these medications     Calcium Acetate, Phos Binder, (PHOSLO PO)      Loratadine 5 MG TBDP      neomycin-polymyxin b-dexamethasone (MAXITROL) 3.5-10000-0.1 SUSP          DISCHARGE INSTRUCTIONS:   Follow-up PMD one week Follow-up your cardiologist 1 week  If you experience worsening of your admission symptoms, develop shortness of breath, life threatening emergency, suicidal or homicidal thoughts you must seek medical attention immediately by calling 911 or calling your MD immediately  if symptoms less severe.  You Must read complete instructions/literature along with all the possible adverse reactions/side effects for all the Medicines you take and that have been prescribed to you. Take any new Medicines after you have completely understood and accept all the possible adverse reactions/side effects.   Please note  You were cared for by a hospitalist during your hospital stay. If you have any questions about your discharge medications or the care you received while you were in the hospital after you are discharged, you can call the unit and asked to speak with the hospitalist on call if the hospitalist that took care of you is not available. Once you are discharged, your primary care physician will handle  any further medical issues. Please note that NO REFILLS for any discharge medications will be authorized once you are discharged, as it is imperative that you return to your primary care physician (or establish a relationship with a primary care physician if you do not have one) for your aftercare needs so that they can reassess your need for medications and monitor your lab values.    Today   CHIEF COMPLAINT:   Chief Complaint  Patient presents with  . Chest Pain    HISTORY OF PRESENT ILLNESS:  Stephanie Ellis  is a 66 y.o. female presented with chest pain   VITAL SIGNS:  Blood pressure (!) 142/58, pulse 68, temperature 98.1 F (36.7 C), temperature source Oral, resp. rate 18, height 5' 4"  (1.626 m), weight 58 kg (127 lb 12.8 oz), SpO2 96 %.    PHYSICAL EXAMINATION:  GENERAL:  66 y.o.-year-old patient lying in the bed with  no acute distress.  EYES: Pupils equal, round, reactive to light and accommodation. No scleral icterus. Extraocular muscles intact.  HEENT: Head atraumatic, normocephalic. Oropharynx and nasopharynx clear.  NECK:  Supple, no jugular venous distention. No thyroid enlargement, no tenderness.  LUNGS: Normal breath sounds bilaterally, no wheezing. Positive rales at the bases. No use of accessory muscles of respiration.  CARDIOVASCULAR: S1, S2 normal. 2/6 systolic murmurs. No rubs, or gallops.  ABDOMEN: Soft, non-tender, non-distended. Bowel sounds present. No organomegaly or mass.  EXTREMITIES: No pedal edema, cyanosis, or clubbing.  NEUROLOGIC: Cranial nerves II through XII are intact. Muscle strength 5/5 in all extremities. Sensation intact. Gait not checked.  PSYCHIATRIC: The patient is alert and oriented x 3.  SKIN: No obvious rash, lesion, or ulcer. Catheter right chest. Bruit over the graft left arm.  DATA REVIEW:   CBC  Recent Labs Lab 09/05/16 0344  WBC 7.3  HGB 11.2*  HCT 34.2*  PLT 172    Chemistries   Recent Labs Lab 09/05/16 0344  NA 135   K 4.1  CL 96*  CO2 31  GLUCOSE 147*  BUN 34*  CREATININE 4.26*  CALCIUM 9.2    Cardiac Enzymes  Recent Labs Lab 09/05/16 0900  TROPONINI 0.06*    Microbiology Results  Results for orders placed or performed during the hospital encounter of 09/04/16  MRSA PCR Screening     Status: None   Collection Time: 09/04/16  7:18 PM  Result Value Ref Range Status   MRSA by PCR NEGATIVE NEGATIVE Final    Comment:        The GeneXpert MRSA Assay (FDA approved for NASAL specimens only), is one component of a comprehensive MRSA colonization surveillance program. It is not intended to diagnose MRSA infection nor to guide or monitor treatment for MRSA infections.     RADIOLOGY:  Dg Chest 1 View  Result Date: 09/04/2016 CLINICAL DATA:  Initial evaluation for worsening shortness of breath. History of CHF. EXAM: CHEST 1 VIEW COMPARISON:  None. FINDINGS: Right-sided hemodialysis catheter in place. Median sternotomy wires underlying CABG markers noted. Moderate cardiomegaly. Mediastinal silhouette normal. Aortic atherosclerosis. Lungs are hypoinflated with elevation left hemidiaphragm. Diffuse pulmonary vascular congestion with interstitial prominence, suggesting mild pulmonary interstitial edema. Small bilateral pleural effusions. No definite focal infiltrates. No pneumothorax. No acute osseus abnormality. IMPRESSION: 1. Cardiomegaly with mild diffuse pulmonary interstitial edema and small bilateral pleural effusions, suggesting mild CHF. 2. Aortic atherosclerosis. 3. Sequelae of prior CABG. Electronically Signed   By: Jeannine Boga M.D.   On: 09/04/2016 15:51      Management plans discussed with the patient, and she is in agreement.  CODE STATUS:  Code Status History    Date Active Date Inactive Code Status Order ID Comments User Context   09/04/2016  5:08 PM 09/05/2016  3:00 PM Full Code [id number]  Epifanio Lesches, MD ED   08/22/2016  9:38 AM 08/22/2016  6:33 PM Full Code  [id number]  Algernon Huxley, MD Inpatient   06/27/2016 11:21 AM 06/27/2016  6:46 PM Full Code [id number]  Algernon Huxley, MD Inpatient   10/03/2015 10:35 PM 10/04/2015  5:24 PM Full Code [id number]  Harrie Foreman, MD Inpatient      TOTAL TIME TAKING CARE OF THIS PATIENT: 35 minutes.    Loletha Grayer M.D on 09/05/2016 at 4:44 PM  Between 7am to 6pm - Pager - [phone]  After 6pm go to [email or web address] - Proofreader  [facility] Office  [phone]  CC: Primary care physician; Dr Etheleen Mayhew

## 2016-09-05 NOTE — Care Management (Signed)
Placed in observation.  ESRD with chronic Dialysis [facility] at [facility].  Stephanie Ellis with [facility] notified. For discharge home today

## 2016-09-05 NOTE — Care Management Obs Status (Signed)
[facility] NOTIFICATION   Patient Details  Name: Stephanie Ellis MRN: [id number] Date of Birth: 10-23-1950   Medicare Observation Status Notification Given:  Yes    Katrina Stack, RN 09/05/2016, 10:24 AM

## 2016-10-03 ENCOUNTER — Encounter (INDEPENDENT_AMBULATORY_CARE_PROVIDER_SITE_OTHER): Payer: Self-pay | Admitting: Vascular Surgery

## 2016-10-03 ENCOUNTER — Ambulatory Visit (INDEPENDENT_AMBULATORY_CARE_PROVIDER_SITE_OTHER): Payer: Medicare Other | Admitting: Vascular Surgery

## 2016-10-03 VITALS — BP 137/66 | HR 68 | Resp 16 | Wt 127.0 lb

## 2016-10-03 DIAGNOSIS — T82898A Other specified complication of vascular prosthetic devices, implants and grafts, initial encounter: Secondary | ICD-10-CM | POA: Diagnosis not present

## 2016-10-03 DIAGNOSIS — Z992 Dependence on renal dialysis: Secondary | ICD-10-CM | POA: Diagnosis not present

## 2016-10-03 DIAGNOSIS — E785 Hyperlipidemia, unspecified: Secondary | ICD-10-CM | POA: Diagnosis not present

## 2016-10-03 DIAGNOSIS — N186 End stage renal disease: Secondary | ICD-10-CM | POA: Diagnosis not present

## 2016-10-03 NOTE — Progress Notes (Signed)
Subjective:    Patient ID: Stephanie Ellis, female    DOB: 25-Nov-1950, 66 y.o.   MRN: [id number] Chief Complaint  Patient presents with  . Follow-up   Patient presents status post a left upper extremity angiogram for steal syndrome. We discussed the results of her angiogram today. At the end of the angiogram, the patient had a widely patent ulnar artery with a small occluded radial artery just beyond its origin with very poor distal reconstitution. The patient continues to have no improvement in her left hand symptoms. She is still experiencing pain and numbness. Patient is unable to grasp items. She is currently being maintained by a right IJ PermCath. Patient denies any fever, nausea or vomiting.   Review of Systems  Constitutional: Negative.   HENT: Negative.   Eyes: Negative.   Respiratory: Negative.   Cardiovascular: Negative.        Left hand pain and numbness.  Gastrointestinal: Negative.   Endocrine: Negative.   Genitourinary: Negative.   Musculoskeletal: Negative.   Skin: Negative.   Allergic/Immunologic: Negative.   Neurological: Negative.   Hematological: Negative.   Psychiatric/Behavioral: Negative.       Objective:   Physical Exam  Constitutional: She is oriented to person, place, and time. She appears well-developed and well-nourished. No distress.  HENT:  Head: Normocephalic and atraumatic.  Eyes: Conjunctivae are normal. Pupils are equal, round, and reactive to light.  Neck: Normal range of motion.  Cardiovascular: Normal rate, regular rhythm, normal heart sounds and intact distal pulses.   Pulses:      Radial pulses are 2+ on the right side, and 0 on the left side.  Left upper extremity graft site: Intact with good bruit and notable thrill. Right IJ PermCath: Intact no signs of infection. Hand is relatively warm becomes cooler towards fingertips. There is no acute vascular issues to the left hand.  Pulmonary/Chest: Effort normal.  Musculoskeletal: Normal  range of motion. She exhibits no edema.  Neurological: She is alert and oriented to person, place, and time.  Skin: Skin is warm and dry. She is not diaphoretic.  Psychiatric: She has a normal mood and affect. Her behavior is normal. Judgment and thought content normal.   BP 137/66   Pulse 68   Resp 16   Wt 127 lb (57.6 kg)   BMI 21.80 kg/m   Past Medical History:  Diagnosis Date  . Anemia   . Anorexia   . Bacteremia due to Pseudomonas   . CHF (congestive heart failure) ([place])   . Complication of anesthesia    WITH LAST STENT 10/17. PASED OUT AND HAD TO BE AWAKENED  . Coronary artery disease involving left main coronary artery 01/2015   [facility]: 70% LM, p-mLAD 50-60% (Resting FFR 0.75), mRCA 80-90%, ~40 Ost OM & D1  . Depression   . Dysrhythmia   . ESRD (end stage renal disease) on dialysis [facility])    ESRD secondary to acute kidney failure s/p CABG  DIALYSIS M/W/F  . Essential hypertension   . GERD (gastroesophageal reflux disease)   . Heart murmur   . Hypercholesterolemia   . Myocardial infarction ([place])   . 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 Marland Kitchen Haithcock: CABG x 3, LIMA to LAD, SVG to RCA, SVG to OM3, EVH  . Type II diabetes mellitus with complication ([facility])    CAD   Social History   Social History  . Marital status: Single    Spouse name: N/A  .  Number of children: N/A  . Years of education: N/A   Occupational History  . Not on file.   Social History Main Topics  . Smoking status: Never Smoker  . Smokeless tobacco: Never Used  . Alcohol use No  . Drug use: No  . Sexual activity: No   Other Topics Concern  . Not on file   Social History Narrative  . No narrative on file   Past Surgical History:  Procedure Laterality Date  . ABDOMINAL HYSTERECTOMY    . AV FISTULA PLACEMENT Left 05/30/2016   Procedure: ARTERIOVENOUS graft;  Surgeon: Algernon Huxley, MD;  Location: [facility] ORS;  Service: Vascular;  Laterality: Left;  . CARDIAC CATHETERIZATION  01/2015   [facility]: Ost  LM 70%, p-m LAD 50-60% (Rest FFR + @ 0.75), mRCA 80-90%, ostD1 40%, pOM1 40%  . CATARACT EXTRACTION W/PHACO Left 01/18/2016   Procedure: CATARACT EXTRACTION PHACO AND INTRAOCULAR LENS PLACEMENT (IOC);  Surgeon: Eulogio Bear, MD;  Location: [facility] ORS;  Service: Ophthalmology;  Laterality: Left;  [place] 1.05 AP% 15.5 CDE 10.16 Fluid Pack Lot # [id number]  . CATARACT EXTRACTION W/PHACO Right 08/01/2016   Procedure: CATARACT EXTRACTION PHACO AND INTRAOCULAR LENS PLACEMENT (IOC);  Surgeon: Eulogio Bear, MD;  Location: [facility] ORS;  Service: Ophthalmology;  Laterality: Right;  [place] 01:00.6 AP% 11.4 CDE 6.93  LOT # [id number]  . COLONOSCOPY    . CORONARY ANGIOPLASTY     SENTS 02/12/16  . CORONARY ARTERY BYPASS GRAFT  03/28/15    [facility]: Dr. Waldemar Dickens: LIMA to LAD, SVG to RCA, SVG to OM3, EVH  . EYE SURGERY    . INSERTION EXPRESS TUBE SHUNT Right 08/01/2016   Procedure: INSERTION EXPRESS TUBE SHUNT;  Surgeon: Eulogio Bear, MD;  Location: [facility] ORS;  Service: Ophthalmology;  Laterality: Right;  . INSERTION OF AHMED VALVE Left 08/15/2016   Procedure: INSERTION OF AHMED VALVE;  Surgeon: Eulogio Bear, MD;  Location: [facility] ORS;  Service: Ophthalmology;  Laterality: Left;  . INSERTION OF DIALYSIS CATHETER    . TRANSTHORACIC ECHOCARDIOGRAM  January 2017    EF 60-65%. GR 2 DD. Mild degenerative mitral valve disease but no prolapse or regurgitation. Mild left atrial dilation. Mild to moderate LVH. Pericardial effusion gone  . UPPER EXTREMITY ANGIOGRAPHY Left 06/27/2016   Procedure: Upper Extremity Angiography;  Surgeon: Algernon Huxley, MD;  Location: [facility];  Service: Cardiovascular;  Laterality: Left;  . UPPER EXTREMITY ANGIOGRAPHY Left 08/22/2016   Procedure: Upper Extremity Angiography;  Surgeon: Algernon Huxley, MD;  Location: [facility];  Service: Cardiovascular;  Laterality: Left;   Family History  Problem Relation Age of Onset  . Diabetes Mellitus II Mother    Allergies  Allergen  Reactions  . Chlorthalidone Anaphylaxis, Itching and Rash  . Fentanyl Rash  . Midazolam Rash  . Ace Inhibitors Other (See Comments)    Reaction:  Hyperkalemia, agitation   . Angiotensin Receptor Blockers Other (See Comments)    Reaction:  Hyperkalemia, agitation   . Norvasc [Amlodipine] Itching and Rash       Assessment & Plan:  Patient presents status post a left upper extremity angiogram for steal syndrome. We discussed the results of her angiogram today. At the end of the angiogram, the patient had a widely patent ulnar artery with a small occluded radial artery just beyond its origin with very poor distal reconstitution. The patient continues to have no improvement in her left hand symptoms. She is still experiencing pain  and numbness. Patient is unable to grasp items. She is currently being maintained by a right IJ PermCath. Patient denies any fever, nausea or vomiting.  1. ESRD (end stage renal disease) on dialysis ([place]) - Stable Patient continues to have left upper extremity hand pain and numbness. Patient underwent a left approximate the angiogram in an effort to restore blood flow to the hand. Radial artery is diseased. Patient states she is unable to grasp items. We'll discuss plan with Dr. Lucky Cowboy when he returns to the office next week. Patient to continue to dialyze through her right IJ PermCath.  2. Hyperlipidemia, unspecified hyperlipidemia type - stable Encouraged good control as its slows the progression of atherosclerotic disease  3. Steal syndrome dialysis vascular access, initial encounter ([place]) - stable Patient with continued hand pain and swelling status post angiogram for steal syndrome. Will speak to Dr. Lucky Cowboy about plan moving forward  Current Outpatient Prescriptions on File Prior to Visit  Medication Sig Dispense Refill  . aspirin 81 MG chewable tablet Chew 81 mg by mouth daily.    Marland Kitchen atorvastatin (LIPITOR) 40 MG tablet Take 40 mg by mouth daily at 6 PM.     .  cholecalciferol (VITAMIN D) 400 units TABS tablet Take 400 Units by mouth daily.     . clopidogrel (PLAVIX) 75 MG tablet Take 1 tablet (75 mg total) by mouth daily. 30 tablet 0  . famotidine (PEPCID) 20 MG tablet Take 1 tablet (20 mg total) by mouth 2 (two) times daily. 60 tablet 0  . insulin glargine (LANTUS) 100 unit/mL SOPN Inject 7 Units into the skin every evening.     . isosorbide mononitrate (IMDUR) 30 MG 24 hr tablet Take 1 tablet (30 mg total) by mouth daily. 30 tablet 0  . loperamide (IMODIUM) 2 MG capsule Take 2 mg by mouth 2 (two) times daily as needed for diarrhea or loose stools.    . metoprolol tartrate (LOPRESSOR) 25 MG tablet 1/2 tablet po twice a day 30 tablet 0  . Multiple Vitamin (MULTIVITAMIN WITH MINERALS) TABS tablet Take 1 tablet by mouth daily.    . nitroGLYCERIN (NITROSTAT) 0.4 MG SL tablet Place 1 tablet (0.4 mg total) under the tongue every 5 (five) minutes as needed for chest pain. 30 tablet 0  . ondansetron (ZOFRAN) 4 MG tablet Take 4 mg by mouth every 8 (eight) hours as needed for nausea or vomiting.    . polyvinyl alcohol (LIQUIFILM TEARS) 1.4 % ophthalmic solution Place 2 drops into both eyes daily as needed for dry eyes.     . sevelamer carbonate (RENVELA) 800 MG tablet Take 800 mg by mouth 3 (three) times daily with meals.     No current facility-administered medications on file prior to visit.     There are no Patient Instructions on file for this visit. No Follow-up on file.   Lalanya Rufener A Sharene Krikorian, PA-C

## 2016-10-09 ENCOUNTER — Telehealth (INDEPENDENT_AMBULATORY_CARE_PROVIDER_SITE_OTHER): Payer: Self-pay | Admitting: Vascular Surgery

## 2016-10-09 NOTE — Telephone Encounter (Signed)
Called patient after I spoke with Dr. Lucky Cowboy. We discussed possibly moving forward with a left forearm loop banding. I explained doing this could possibly increase the blood flow to her hand. We also discussed the possibility of the decreased blood flow through the graft subsequently clotting off the graft. She understands if this happens she would need to have a PermCath inserted Implanon new access in the right upper extremity. Patient will but some time to think about it. If she decides to move forward she will call the office.

## 2016-10-11 ENCOUNTER — Encounter (INDEPENDENT_AMBULATORY_CARE_PROVIDER_SITE_OTHER): Payer: Self-pay

## 2016-10-14 ENCOUNTER — Other Ambulatory Visit (INDEPENDENT_AMBULATORY_CARE_PROVIDER_SITE_OTHER): Payer: Self-pay | Admitting: Vascular Surgery

## 2016-10-17 ENCOUNTER — Emergency Department: Payer: Medicare Other

## 2016-10-17 ENCOUNTER — Encounter: Payer: Self-pay | Admitting: Emergency Medicine

## 2016-10-17 ENCOUNTER — Encounter
Admission: RE | Admit: 2016-10-17 | Discharge: 2016-10-17 | Disposition: A | Discharge status: Home / Self Care | Payer: Medicare Other | Source: Ambulatory Visit | Attending: Vascular Surgery | Admitting: Vascular Surgery

## 2016-10-17 ENCOUNTER — Inpatient Hospital Stay
Admission: EM | Admit: 2016-10-17 | Discharge: 2016-10-19 | DRG: 689 | Disposition: A | Discharge status: Home / Self Care | Payer: Medicare Other | Attending: Internal Medicine | Admitting: Internal Medicine

## 2016-10-17 ENCOUNTER — Other Ambulatory Visit: Payer: Medicare Other

## 2016-10-17 DIAGNOSIS — N029 Recurrent and persistent hematuria with unspecified morphologic changes: Secondary | ICD-10-CM | POA: Diagnosis present

## 2016-10-17 DIAGNOSIS — E1122 Type 2 diabetes mellitus with diabetic chronic kidney disease: Secondary | ICD-10-CM | POA: Diagnosis present

## 2016-10-17 DIAGNOSIS — N186 End stage renal disease: Secondary | ICD-10-CM | POA: Diagnosis present

## 2016-10-17 DIAGNOSIS — Z7902 Long term (current) use of antithrombotics/antiplatelets: Secondary | ICD-10-CM | POA: Diagnosis not present

## 2016-10-17 DIAGNOSIS — Z885 Allergy status to narcotic agent status: Secondary | ICD-10-CM | POA: Diagnosis not present

## 2016-10-17 DIAGNOSIS — N3091 Cystitis, unspecified with hematuria: Secondary | ICD-10-CM | POA: Diagnosis present

## 2016-10-17 DIAGNOSIS — I252 Old myocardial infarction: Secondary | ICD-10-CM | POA: Diagnosis not present

## 2016-10-17 DIAGNOSIS — Z79899 Other long term (current) drug therapy: Secondary | ICD-10-CM

## 2016-10-17 DIAGNOSIS — I509 Heart failure, unspecified: Secondary | ICD-10-CM | POA: Diagnosis present

## 2016-10-17 DIAGNOSIS — Z794 Long term (current) use of insulin: Secondary | ICD-10-CM

## 2016-10-17 DIAGNOSIS — I10 Essential (primary) hypertension: Secondary | ICD-10-CM | POA: Diagnosis not present

## 2016-10-17 DIAGNOSIS — Z90710 Acquired absence of both cervix and uterus: Secondary | ICD-10-CM

## 2016-10-17 DIAGNOSIS — Z888 Allergy status to other drugs, medicaments and biological substances status: Secondary | ICD-10-CM | POA: Diagnosis not present

## 2016-10-17 DIAGNOSIS — E785 Hyperlipidemia, unspecified: Secondary | ICD-10-CM | POA: Diagnosis present

## 2016-10-17 DIAGNOSIS — Z833 Family history of diabetes mellitus: Secondary | ICD-10-CM | POA: Diagnosis not present

## 2016-10-17 DIAGNOSIS — I959 Hypotension, unspecified: Secondary | ICD-10-CM | POA: Diagnosis present

## 2016-10-17 DIAGNOSIS — I2510 Atherosclerotic heart disease of native coronary artery without angina pectoris: Secondary | ICD-10-CM | POA: Diagnosis present

## 2016-10-17 DIAGNOSIS — Z992 Dependence on renal dialysis: Secondary | ICD-10-CM

## 2016-10-17 DIAGNOSIS — N2581 Secondary hyperparathyroidism of renal origin: Secondary | ICD-10-CM | POA: Diagnosis present

## 2016-10-17 DIAGNOSIS — Z9861 Coronary angioplasty status: Secondary | ICD-10-CM

## 2016-10-17 DIAGNOSIS — N390 Urinary tract infection, site not specified: Secondary | ICD-10-CM | POA: Diagnosis not present

## 2016-10-17 DIAGNOSIS — Z951 Presence of aortocoronary bypass graft: Secondary | ICD-10-CM

## 2016-10-17 DIAGNOSIS — N3090 Cystitis, unspecified without hematuria: Secondary | ICD-10-CM

## 2016-10-17 DIAGNOSIS — G458 Other transient cerebral ischemic attacks and related syndromes: Secondary | ICD-10-CM | POA: Diagnosis not present

## 2016-10-17 DIAGNOSIS — D631 Anemia in chronic kidney disease: Secondary | ICD-10-CM | POA: Diagnosis present

## 2016-10-17 DIAGNOSIS — R310 Gross hematuria: Secondary | ICD-10-CM | POA: Diagnosis not present

## 2016-10-17 DIAGNOSIS — T82898A Other specified complication of vascular prosthetic devices, implants and grafts, initial encounter: Secondary | ICD-10-CM | POA: Diagnosis present

## 2016-10-17 DIAGNOSIS — E119 Type 2 diabetes mellitus without complications: Secondary | ICD-10-CM | POA: Diagnosis not present

## 2016-10-17 DIAGNOSIS — Z7982 Long term (current) use of aspirin: Secondary | ICD-10-CM

## 2016-10-17 DIAGNOSIS — R112 Nausea with vomiting, unspecified: Secondary | ICD-10-CM

## 2016-10-17 DIAGNOSIS — I132 Hypertensive heart and chronic kidney disease with heart failure and with stage 5 chronic kidney disease, or end stage renal disease: Secondary | ICD-10-CM | POA: Diagnosis present

## 2016-10-17 DIAGNOSIS — K219 Gastro-esophageal reflux disease without esophagitis: Secondary | ICD-10-CM | POA: Diagnosis present

## 2016-10-17 DIAGNOSIS — R319 Hematuria, unspecified: Secondary | ICD-10-CM

## 2016-10-17 LAB — CBC WITH DIFFERENTIAL/PLATELET
Basophils Absolute: 0 10*3/uL (ref 0–0.1)
Basophils Relative: 0 %
Eosinophils Absolute: 0.1 10*3/uL (ref 0–0.7)
Eosinophils Relative: 1 %
HCT: 41.3 % (ref 35.0–47.0)
Hemoglobin: 13.3 g/dL (ref 12.0–16.0)
Lymphocytes Relative: 9 %
Lymphs Abs: 0.8 10*3/uL — ABNORMAL LOW (ref 1.0–3.6)
MCH: 30.1 pg (ref 26.0–34.0)
MCHC: 32.2 g/dL (ref 32.0–36.0)
MCV: 93.3 fL (ref 80.0–100.0)
Monocytes Absolute: 0.5 10*3/uL (ref 0.2–0.9)
Monocytes Relative: 5 %
Neutro Abs: 7.9 10*3/uL — ABNORMAL HIGH (ref 1.4–6.5)
Neutrophils Relative %: 85 %
Platelets: 174 10*3/uL (ref 150–440)
RBC: 4.42 MIL/uL (ref 3.80–5.20)
RDW: 20.1 % — ABNORMAL HIGH (ref 11.5–14.5)
WBC: 9.4 10*3/uL (ref 3.6–11.0)

## 2016-10-17 LAB — HEPATIC FUNCTION PANEL
ALT: 56 U/L — ABNORMAL HIGH (ref 14–54)
AST: 35 U/L (ref 15–41)
Albumin: 4 g/dL (ref 3.5–5.0)
Alkaline Phosphatase: 149 U/L — ABNORMAL HIGH (ref 38–126)
Bilirubin, Direct: <0.1 mg/dL — ABNORMAL LOW (ref 0.1–0.5)
Total Bilirubin: 0.6 mg/dL (ref 0.3–1.2)
Total Protein: 8.2 g/dL — ABNORMAL HIGH (ref 6.5–8.1)

## 2016-10-17 LAB — URINALYSIS, COMPLETE (UACMP) WITH MICROSCOPIC
Specific Gravity, Urine: 1.014 (ref 1.005–1.030)
Squamous Epithelial / LPF: NONE SEEN

## 2016-10-17 LAB — GLUCOSE, CAPILLARY: Glucose-Capillary: 204 mg/dL — ABNORMAL HIGH (ref 65–99)

## 2016-10-17 LAB — TYPE AND SCREEN
ABO/RH(D): B POS
Antibody Screen: NEGATIVE

## 2016-10-17 LAB — BASIC METABOLIC PANEL
Anion gap: 11 (ref 5–15)
BUN: 24 mg/dL — ABNORMAL HIGH (ref 6–20)
CO2: 29 mmol/L (ref 22–32)
Calcium: 9.9 mg/dL (ref 8.9–10.3)
Chloride: 97 mmol/L — ABNORMAL LOW (ref 101–111)
Creatinine, Ser: 4.34 mg/dL — ABNORMAL HIGH (ref 0.44–1.00)
GFR calc Af Amer: 11 mL/min — ABNORMAL LOW (ref >60)
GFR calc non Af Amer: 10 mL/min — ABNORMAL LOW (ref >60)
Glucose, Bld: 166 mg/dL — ABNORMAL HIGH (ref 65–99)
Potassium: 4.3 mmol/L (ref 3.5–5.1)
Sodium: 137 mmol/L (ref 135–145)

## 2016-10-17 LAB — PROTIME-INR
INR: 1.03
Prothrombin Time: 13.5 seconds (ref 11.4–15.2)

## 2016-10-17 LAB — APTT: aPTT: 33 seconds (ref 24–36)

## 2016-10-17 LAB — SURGICAL PCR SCREEN
MRSA, PCR: NEGATIVE
Staphylococcus aureus: NEGATIVE

## 2016-10-17 LAB — LIPASE, BLOOD: Lipase: 37 U/L (ref 11–51)

## 2016-10-17 MED ORDER — METOPROLOL TARTRATE 25 MG PO TABS
12.5000 mg | ORAL_TABLET | Freq: Two times a day (BID) | ORAL | Status: DC
  Administered 2016-10-17: 12.5 mg via ORAL
  Filled 2016-10-17: qty 1

## 2016-10-17 MED ORDER — DEXTROSE 5 % IV SOLN
1.0000 g | INTRAVENOUS | Status: DC
  Administered 2016-10-19: 1 g via INTRAVENOUS
  Filled 2016-10-17 (×2): qty 10

## 2016-10-17 MED ORDER — ONDANSETRON HCL 4 MG PO TABS: 4.0000 mg | ORAL_TABLET | Freq: Three times a day (TID) | ORAL | Status: DC | PRN

## 2016-10-17 MED ORDER — ATORVASTATIN CALCIUM 20 MG PO TABS
40.0000 mg | ORAL_TABLET | Freq: Every day | ORAL | Status: DC
  Administered 2016-10-18: 40 mg via ORAL
  Filled 2016-10-17: qty 2

## 2016-10-17 MED ORDER — ADULT MULTIVITAMIN W/MINERALS CH
2.0000 | ORAL_TABLET | Freq: Every day | ORAL | Status: DC
  Administered 2016-10-18 – 2016-10-19 (×2): 2 via ORAL
  Filled 2016-10-17 (×2): qty 2

## 2016-10-17 MED ORDER — DOCUSATE SODIUM 100 MG PO CAPS
100.0000 mg | ORAL_CAPSULE | Freq: Two times a day (BID) | ORAL | Status: DC | PRN
  Filled 2016-10-17: qty 1

## 2016-10-17 MED ORDER — ONDANSETRON HCL 4 MG/2ML IJ SOLN
4.0000 mg | Freq: Once | INTRAMUSCULAR | Status: AC
  Administered 2016-10-17: 4 mg via INTRAVENOUS
  Filled 2016-10-17: qty 2

## 2016-10-17 MED ORDER — ISOSORBIDE MONONITRATE ER 30 MG PO TB24
30.0000 mg | ORAL_TABLET | Freq: Every day | ORAL | Status: DC
  Administered 2016-10-18 – 2016-10-19 (×2): 30 mg via ORAL
  Filled 2016-10-17 (×2): qty 1

## 2016-10-17 MED ORDER — NITROGLYCERIN 0.4 MG SL SUBL
SUBLINGUAL_TABLET | SUBLINGUAL | Status: AC
  Filled 2016-10-17: qty 1

## 2016-10-17 MED ORDER — ONDANSETRON HCL 4 MG/2ML IJ SOLN: 4.0000 mg | Freq: Once | INTRAMUSCULAR | Status: DC

## 2016-10-17 MED ORDER — NITROGLYCERIN 0.4 MG SL SUBL
0.4000 mg | SUBLINGUAL_TABLET | Freq: Once | SUBLINGUAL | Status: AC
  Administered 2016-10-17: 0.4 mg via SUBLINGUAL

## 2016-10-17 MED ORDER — INSULIN GLARGINE 100 UNITS/ML SOLOSTAR PEN: 14.0000 [IU] | PEN_INJECTOR | Freq: Every evening | SUBCUTANEOUS | Status: DC

## 2016-10-17 MED ORDER — SEVELAMER CARBONATE 800 MG PO TABS
800.0000 mg | ORAL_TABLET | Freq: Three times a day (TID) | ORAL | Status: DC
  Administered 2016-10-18 – 2016-10-19 (×2): 800 mg via ORAL
  Filled 2016-10-17 (×2): qty 1

## 2016-10-17 MED ORDER — FAMOTIDINE 20 MG PO TABS
20.0000 mg | ORAL_TABLET | Freq: Two times a day (BID) | ORAL | Status: DC
  Administered 2016-10-17 – 2016-10-18 (×3): 20 mg via ORAL
  Filled 2016-10-17 (×3): qty 1

## 2016-10-17 MED ORDER — CHOLECALCIFEROL 10 MCG (400 UNIT) PO TABS
400.0000 [IU] | ORAL_TABLET | Freq: Every day | ORAL | Status: DC
  Administered 2016-10-17 – 2016-10-19 (×3): 400 [IU] via ORAL
  Filled 2016-10-17 (×3): qty 1

## 2016-10-17 MED ORDER — PROMETHAZINE HCL 25 MG/ML IJ SOLN
12.5000 mg | Freq: Once | INTRAMUSCULAR | Status: AC
  Administered 2016-10-17: 12.5 mg via INTRAVENOUS
  Filled 2016-10-17: qty 1

## 2016-10-17 MED ORDER — INSULIN GLARGINE 100 UNIT/ML ~~LOC~~ SOLN
14.0000 [IU] | Freq: Every day | SUBCUTANEOUS | Status: DC
  Administered 2016-10-17: 14 [IU] via SUBCUTANEOUS
  Filled 2016-10-17 (×3): qty 0.14

## 2016-10-17 MED ORDER — ONDANSETRON 4 MG PO TBDP
4.0000 mg | ORAL_TABLET | Freq: Once | ORAL | Status: AC
  Administered 2016-10-17: 4 mg via ORAL
  Filled 2016-10-17: qty 1

## 2016-10-17 MED ORDER — CEFTRIAXONE SODIUM 1 G IJ SOLR
INTRAMUSCULAR | Status: AC
  Filled 2016-10-17: qty 10

## 2016-10-17 MED ORDER — DEXTROSE 5 % IV SOLN
1.0000 g | Freq: Once | INTRAVENOUS | Status: AC
  Administered 2016-10-17: 1 g via INTRAVENOUS

## 2016-10-17 MED ORDER — INSULIN ASPART 100 UNIT/ML ~~LOC~~ SOLN
0.0000 [IU] | Freq: Three times a day (TID) | SUBCUTANEOUS | Status: DC
  Administered 2016-10-19: 2 [IU] via SUBCUTANEOUS
  Filled 2016-10-17 (×11): qty 0.09
  Filled 2016-10-17: qty 1
  Filled 2016-10-17 (×7): qty 0.09

## 2016-10-17 MED ORDER — NITROGLYCERIN 0.4 MG SL SUBL: 0.4000 mg | SUBLINGUAL_TABLET | SUBLINGUAL | Status: DC | PRN

## 2016-10-17 NOTE — ED Triage Notes (Signed)
Pt was at preop; had permcath placed and was causing tingling in arm so there for placement adjustment.  They took BP and was elevated so sent pt to ED.  Pt denies CP, SHOB, headache or vision changes.  Has had LLQ abdominal pain with nausea.  No fevers.  Has been dry heaving all morning.

## 2016-10-17 NOTE — Pre-Procedure Instructions (Signed)
Spoke to Dr. Amie Critchley regarding pt's elevated BP and nausea with dry heaves.  He recommends pt be seen today for evaluation and treatment of her BP either at the ER or her PCP.  Pt has agreed to go to ER today.

## 2016-10-17 NOTE — ED Notes (Signed)
ED Provider at bedside. 

## 2016-10-17 NOTE — Pre-Procedure Instructions (Signed)
Today's abnormal labs faxed to Dr. Bunnie Domino office.

## 2016-10-17 NOTE — ED Notes (Signed)
Pt moved to subwait area.  Daughter c/o pt vomiting to first nurse. Offered another dose of zofran, declined at this time. Content at this time with new waiting room.

## 2016-10-17 NOTE — Progress Notes (Signed)
Family Meeting Note  Advance Directive:yes  Today a meeting took place with the Patient and daughter.  The following clinical team members were present during this meeting:MD  The following were discussed:Patient's diagnosis: UTI, ESRD on HD, Hematuria, CAD. , Patient's progosis: Unable to determine and Goals for treatment: Full Code  Additional follow-up to be provided: treat UTI  Time spent during discussion:20 minutes  Stephanie Ellis, Rosalio Macadamia, MD

## 2016-10-17 NOTE — ED Notes (Signed)
Discussed pt with dr Darl Householder. No other orders.

## 2016-10-17 NOTE — H&P (Signed)
[facility] [place] NAME: Stephanie Ellis    MR#:  [id number]  DATE OF BIRTH:  1950/05/31  DATE OF ADMISSION:  10/17/2016  PRIMARY CARE PHYSICIAN: System, Pcp Not In   REQUESTING/REFERRING PHYSICIAN: williams  CHIEF COMPLAINT:   Chief Complaint  Patient presents with  . Hypertension    HISTORY OF PRESENT ILLNESS: Stephanie Ellis  is a 66 y.o. female with a known history of Anemia, CHF, coronary artery disease, end-stage renal disease on hemodialysis, essential hypertension, myocardial infarction, status post CABG, diabetes- today morning started having some nausea and noted her urine is having blood. She denies any fever but had some chills. Denies any abdominal or back pain. Denies any recent injury or procedures related to her urine. Noted to have UTI in ER and CT scan showed cystitis but no other acute findings. She is taking aspirin and Plavix for coronary artery disease and scheduled to have vascular graft procedure next week and was advised to stop aspirin and Plavix from Saturday on word this week.  PAST MEDICAL HISTORY:   Past Medical History:  Diagnosis Date  . Anemia   . Anorexia   . Bacteremia due to Pseudomonas   . CHF (congestive heart failure) ([place])   . Complication of anesthesia    WITH LAST STENT 10/17. PASED OUT AND HAD TO BE AWAKENED  . Coronary artery disease involving left main coronary artery 01/2015   [facility]: 70% LM, p-mLAD 50-60% (Resting FFR 0.75), mRCA 80-90%, ~40 Ost OM & D1  . Dysrhythmia   . ESRD (end stage renal disease) on dialysis [facility])    ESRD secondary to acute kidney failure s/p CABG  DIALYSIS M/W/F  . Essential hypertension   . GERD (gastroesophageal reflux disease)   . Heart murmur   . Hypercholesterolemia   . Myocardial infarction ([place])   . 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 Marland Kitchen [place]: CABG x 3, LIMA to LAD, SVG to RCA, SVG to OM3, EVH  . Type II diabetes mellitus with complication (HCC)     CAD    PAST SURGICAL HISTORY: Past Surgical History:  Procedure Laterality Date  . ABDOMINAL HYSTERECTOMY    . AV FISTULA PLACEMENT Left 05/30/2016   Procedure: ARTERIOVENOUS graft;  Surgeon: Algernon Huxley, MD;  Location: [facility] ORS;  Service: Vascular;  Laterality: Left;  . CARDIAC CATHETERIZATION  01/2015   [facility]: Ost LM 70%, p-m LAD 50-60% (Rest FFR + @ 0.75), mRCA 80-90%, ostD1 40%, pOM1 40%  . CATARACT EXTRACTION W/PHACO Left 01/18/2016   Procedure: CATARACT EXTRACTION PHACO AND INTRAOCULAR LENS PLACEMENT (IOC);  Surgeon: Eulogio Bear, MD;  Location: [facility] ORS;  Service: Ophthalmology;  Laterality: Left;  [place] 1.05 AP% 15.5 CDE 10.16 Fluid Pack Lot # [id number]  . CATARACT EXTRACTION W/PHACO Right 08/01/2016   Procedure: CATARACT EXTRACTION PHACO AND INTRAOCULAR LENS PLACEMENT (IOC);  Surgeon: Eulogio Bear, MD;  Location: [facility] ORS;  Service: Ophthalmology;  Laterality: Right;  [place] 01:00.6 AP% 11.4 CDE 6.93  LOT # [id number]  . COLONOSCOPY    . CORONARY ANGIOPLASTY     SENTS 02/12/16  . CORONARY ARTERY BYPASS GRAFT  03/28/15    [facility]: Dr. Waldemar Dickens: LIMA to LAD, SVG to RCA, SVG to OM3, EVH  . EYE SURGERY    . INSERTION EXPRESS TUBE SHUNT Right 08/01/2016   Procedure: INSERTION EXPRESS TUBE SHUNT;  Surgeon: Eulogio Bear, MD;  Location: [facility] ORS;  Service: Ophthalmology;  Laterality:  Right;  Marland Kitchen INSERTION OF AHMED VALVE Left 08/15/2016   Procedure: INSERTION OF AHMED VALVE;  Surgeon: Eulogio Bear, MD;  Location: [facility] ORS;  Service: Ophthalmology;  Laterality: Left;  . INSERTION OF DIALYSIS CATHETER    . TRANSTHORACIC ECHOCARDIOGRAM  January 2017    EF 60-65%. GR 2 DD. Mild degenerative mitral valve disease but no prolapse or regurgitation. Mild left atrial dilation. Mild to moderate LVH. Pericardial effusion gone  . UPPER EXTREMITY ANGIOGRAPHY Left 06/27/2016   Procedure: Upper Extremity Angiography;  Surgeon: Algernon Huxley, MD;  Location: [place] [facility];  Service:  Cardiovascular;  Laterality: Left;  . UPPER EXTREMITY ANGIOGRAPHY Left 08/22/2016   Procedure: Upper Extremity Angiography;  Surgeon: Algernon Huxley, MD;  Location: [place] [facility];  Service: Cardiovascular;  Laterality: Left;    SOCIAL HISTORY:  Social History  Substance Use Topics  . Smoking status: Never Smoker  . Smokeless tobacco: Never Used  . Alcohol use No    FAMILY HISTORY:  Family History  Problem Relation Age of Onset  . Diabetes Mellitus II Mother     DRUG ALLERGIES:  Allergies  Allergen Reactions  . Chlorthalidone Anaphylaxis, Itching and Rash  . Fentanyl Rash  . Midazolam Rash  . Ace Inhibitors Other (See Comments)    Reaction:  Hyperkalemia, agitation   . Angiotensin Receptor Blockers Other (See Comments)    Reaction:  Hyperkalemia, agitation   . Norvasc [Amlodipine] Itching and Rash    REVIEW OF SYSTEMS:   CONSTITUTIONAL: No fever, fatigue or weakness.  EYES: No blurred or double vision.  EARS, NOSE, AND THROAT: No tinnitus or ear pain.  RESPIRATORY: No cough, shortness of breath, wheezing or hemoptysis.  CARDIOVASCULAR: No chest pain, orthopnea, edema.  GASTROINTESTINAL: No nausea, vomiting, diarrhea or abdominal pain.  GENITOURINARY: No dysuria,Positive for hematuria.  ENDOCRINE: No polyuria, nocturia,  HEMATOLOGY: No anemia, easy bruising or bleeding SKIN: No rash or lesion. MUSCULOSKELETAL: No joint pain or arthritis.   NEUROLOGIC: No tingling, numbness, weakness.  PSYCHIATRY: No anxiety or depression.   MEDICATIONS AT HOME:  Prior to Admission medications   Medication Sig Start Date End Date Taking? Authorizing Provider  aspirin EC 81 MG tablet Take 81 mg by mouth daily.   Yes [provider]  atorvastatin (LIPITOR) 40 MG tablet Take 40 mg by mouth daily at 6 PM.    Yes [provider]  cholecalciferol (VITAMIN D) 400 units TABS tablet Take 400 Units by mouth daily.    Yes [provider]  clopidogrel (PLAVIX) 75 MG  tablet Take 1 tablet (75 mg total) by mouth daily. 09/05/16  Yes Wieting, Richard, MD  Difluprednate (DUREZOL) 0.05 % EMUL Apply 1 drop to eye 4 (four) times daily.   Yes [provider]  famotidine (PEPCID) 20 MG tablet Take 1 tablet (20 mg total) by mouth 2 (two) times daily. Patient taking differently: Take 20 mg by mouth daily as needed for heartburn.  09/05/16  Yes Wieting, Richard, MD  insulin glargine (LANTUS) 100 unit/mL SOPN Inject 14 Units into the skin every evening.    Yes [provider]  isosorbide mononitrate (IMDUR) 30 MG 24 hr tablet Take 1 tablet (30 mg total) by mouth daily. 09/05/16  Yes Wieting, Richard, MD  metoprolol tartrate (LOPRESSOR) 25 MG tablet 1/2 tablet po twice a day Patient taking differently: Take 12.5 mg by mouth 2 (two) times daily. 1/2 tablet po twice a day 09/05/16  Yes Loletha Grayer, MD  Multiple  Vitamin (MULTIVITAMIN WITH MINERALS) TABS tablet Take 2 tablets by mouth daily. Gummy vitamins   Yes [provider]  nitroGLYCERIN (NITROSTAT) 0.4 MG SL tablet Place 1 tablet (0.4 mg total) under the tongue every 5 (five) minutes as needed for chest pain. 09/05/16  Yes Wieting, Richard, MD  ondansetron (ZOFRAN) 4 MG tablet Take 4 mg by mouth every 8 (eight) hours as needed for nausea or vomiting.   Yes [provider]  sevelamer carbonate (RENVELA) 800 MG tablet Take 800 mg by mouth 3 (three) times daily with meals.   Yes [provider]      PHYSICAL EXAMINATION:   VITAL SIGNS: Blood pressure (!) 173/67, pulse 69, temperature 98.2 F (36.8 C), temperature source Oral, resp. rate 17, height 5' 4"  (1.626 m), weight 57.6 kg (127 lb), SpO2 95 %.  GENERAL:  66 y.o.-year-old patient lying in the bed with no acute distress.  EYES: Pupils equal, round, reactive to light and accommodation. No scleral icterus. Extraocular muscles intact.  HEENT: Head atraumatic, normocephalic. Oropharynx and nasopharynx clear.  NECK:  Supple, no  jugular venous distention. No thyroid enlargement, no tenderness.  LUNGS: Normal breath sounds bilaterally, no wheezing, rales,rhonchi or crepitation. No use of accessory muscles of respiration.  CARDIOVASCULAR: S1, S2 normal. Murmurs present, no rubs, or gallops.  ABDOMEN: Soft, nontender, nondistended. Bowel sounds present. No organomegaly or mass.  EXTREMITIES: No pedal edema, cyanosis, or clubbing. Left upper extremity AV graft present. NEUROLOGIC: Cranial nerves II through XII are intact. Muscle strength 5/5 in all extremities. Sensation intact. Gait not checked.  PSYCHIATRIC: The patient is alert and oriented x 3.  SKIN: No obvious rash, lesion, or ulcer.   LABORATORY PANEL:   CBC  Recent Labs Lab 10/17/16 1009  WBC 9.4  HGB 13.3  HCT 41.3  PLT 174  MCV 93.3  MCH 30.1  MCHC 32.2  RDW 20.1*  LYMPHSABS 0.8*  MONOABS 0.5  EOSABS 0.1  BASOSABS 0.0   ------------------------------------------------------------------------------------------------------------------  Chemistries   Recent Labs Lab 10/17/16 1009  NA 137  K 4.3  CL 97*  CO2 29  GLUCOSE 166*  BUN 24*  CREATININE 4.34*  CALCIUM 9.9  AST 35  ALT 56*  ALKPHOS 149*  BILITOT 0.6   ------------------------------------------------------------------------------------------------------------------ estimated creatinine clearance is 11.2 mL/min (A) (by C-G formula based on SCr of 4.34 mg/dL (H)). ------------------------------------------------------------------------------------------------------------------ No results for input(s): TSH, T4TOTAL, T3FREE, THYROIDAB in the last 72 hours.  Invalid input(s): FREET3   Coagulation profile  Recent Labs Lab 10/17/16 1009  INR 1.03   ------------------------------------------------------------------------------------------------------------------- No results for input(s): DDIMER in the last 72  hours. -------------------------------------------------------------------------------------------------------------------  Cardiac Enzymes No results for input(s): CKMB, TROPONINI, MYOGLOBIN in the last 168 hours.  Invalid input(s): CK ------------------------------------------------------------------------------------------------------------------ Invalid input(s): POCBNP  ---------------------------------------------------------------------------------------------------------------  Urinalysis    Component Value Date/Time   COLORURINE RED (A) 10/17/2016 1643   APPEARANCEUR TURBID (A) 10/17/2016 1643   LABSPEC 1.014 10/17/2016 1643   PHURINE  10/17/2016 1643    TEST NOT REPORTED DUE TO COLOR INTERFERENCE OF URINE PIGMENT   GLUCOSEU (A) 10/17/2016 1643    TEST NOT REPORTED DUE TO COLOR INTERFERENCE OF URINE PIGMENT   HGBUR (A) 10/17/2016 1643    TEST NOT REPORTED DUE TO COLOR INTERFERENCE OF URINE PIGMENT   BILIRUBINUR (A) 10/17/2016 1643    TEST NOT REPORTED DUE TO COLOR INTERFERENCE OF URINE PIGMENT   KETONESUR (A) 10/17/2016 1643    TEST NOT REPORTED DUE TO COLOR INTERFERENCE OF URINE PIGMENT  PROTEINUR (A) 10/17/2016 1643    TEST NOT REPORTED DUE TO COLOR INTERFERENCE OF URINE PIGMENT   NITRITE (A) 10/17/2016 1643    TEST NOT REPORTED DUE TO COLOR INTERFERENCE OF URINE PIGMENT   LEUKOCYTESUR (A) 10/17/2016 1643    TEST NOT REPORTED DUE TO COLOR INTERFERENCE OF URINE PIGMENT     RADIOLOGY: Ct Renal Stone Study  Result Date: 10/17/2016 CLINICAL DATA:  Left lower quadrant abdominal pain and nausea. EXAM: CT ABDOMEN AND PELVIS WITHOUT CONTRAST TECHNIQUE: Multidetector CT imaging of the abdomen and pelvis was performed following the standard protocol without IV contrast. COMPARISON:  10/03/2015. FINDINGS: Lower chest: Enlarged heart with mild progression. No significant change in bibasilar pleural-parenchymal scarring. Hepatobiliary: Small, poorly defined gallstones in the  dependent portion of the gallbladder, the largest measuring 3 mm. No gallbladder wall thickening or pericholecystic fluid. Normal appearing liver. Pancreas: Unremarkable. No pancreatic ductal dilatation or surrounding inflammatory changes. Spleen: Vascular calcifications and possible calcified granulomata. Normal size and shape. Adrenals/Urinary Tract: Normal appearing adrenal glands. Bilateral renal artery branch calcifications, making it difficult to evaluate for calculi. There are probably some small calculi. Air in the upper pole renal collecting system on the right. There is also a 6 mm probable calculus within the collecting system on image number 21 of series 2, not previously present. Air in the urinary bladder. Mild diffuse bladder wall thickening with progression. No bladder or ureteral calculi seen. No hydronephrosis. Stomach/Bowel: Multiple colonic diverticula without evidence of diverticulitis. Small hiatal hernia. Unremarkable small bowel and appendix. Vascular/Lymphatic: Extensive atheromatous arterial calcifications without aneurysm. No enlarged lymph nodes. Reproductive: Surgically absent uterus.  No adnexal masses. Other: Midline surgical scar with and infraumbilical midline hernia containing mildly edematous fat. Tiny umbilical hernia containing fat. Musculoskeletal: Lumbar and lower thoracic spine degenerative changes, most pronounced involving the facet joints at multiple levels. IMPRESSION: 1. Small to moderate-sized midline incisional scar below the umbilicus containing mildly edematous fat. 2. Colonic diverticulosis without evidence of diverticulitis. 3. Air in the urinary bladder and upper pole right renal collecting system, most likely due to recent bladder catheterization. 4. Interval 6 mm upper pole right renal calculus. 5. Mild to moderate diffuse bladder wall thickening with mild progression. This may be due to cystitis. 6. Extensive dense atheromatous arterial calcifications. 7. Small  hiatal hernia. Electronically Signed   By: Claudie Revering M.D.   On: 10/17/2016 16:26    EKG: Orders placed or performed during the hospital encounter of 10/17/16  . EKG test  . EKG test    IMPRESSION AND PLAN:  * Hematuria, UTI and cystitis.   IV ceftriaxone, check urine culture.   Hold aspirin and Plavix from today onwards as she was supposed to stop it after 2 days for her scheduled procedure next week.   If hematuria does not stop we may need to call urology for cystoscopy.  * End-stage renal disease on hemodialysis   Nephrology consult for further management.  * Coronary artery disease status post CABG last surgery was more than one year ago.   Hold aspirin and Plavix right now because of bleeding and planned surgery for next week.  * Hypertension   Continue home medication.  * Diabetes   Continue basal insulin and keep on sliding scale coverage.  All the records are reviewed and case discussed with ED provider. Management plans discussed with the patient, family and they are in agreement.  CODE STATUS: Code Status History    Date Active Date Inactive Code Status Order  ID Comments User Context   09/04/2016  5:08 PM 09/05/2016  3:00 PM Full Code [id number]  Epifanio Lesches, MD ED   08/22/2016  9:38 AM 08/22/2016  6:33 PM Full Code [id number]  Algernon Huxley, MD Inpatient   06/27/2016 11:21 AM 06/27/2016  6:46 PM Full Code [id number]  Algernon Huxley, MD Inpatient   10/03/2015 10:35 PM 10/04/2015  5:24 PM Full Code [id number]  Harrie Foreman, MD Inpatient     Pt's daughter was present in room during my visit.  TOTAL TIME TAKING CARE OF THIS PATIENT: 50 minutes.    Vaughan Basta M.D on 10/17/2016   Between 7am to 6pm - Pager - [phone]  After 6pm go to [email or web address] - password [email or web address] [facility]  Office  [phone]  CC: Primary care physician; System, Pcp Not In   Note: This dictation was prepared with Dragon dictation along with smaller  phrase technology. Any transcriptional errors that result from this process are unintentional.

## 2016-10-17 NOTE — ED Notes (Signed)
Protocol labs added to labs that were drawn this morning by preop.

## 2016-10-17 NOTE — ED Notes (Signed)
Meds given.  Pt voided grossly bloody urine in toilet.  md aware.

## 2016-10-17 NOTE — ED Provider Notes (Signed)
[facility] Emergency Department Provider Note       Time seen: ----------------------------------------- 2:12 PM on 10/17/2016 -----------------------------------------     I have reviewed the triage vital signs and the nursing notes.   HISTORY   Chief Complaint Hypertension    HPI Stephanie Ellis is a 66 y.o. female who presents to the ED for hypertension as well as nausea and vomiting. Patient has had some tingling in her left arm where her dialysis access is and there was planned for surgery to try to alleviate the tingling. Patient was here for same and was noted to have elevated blood pressure so she was sent to the ER for evaluation. She denies fevers, chills, chest pain, shortness of breath, headache or other complaints. She has had persistent nausea with some vomiting.   Past Medical History:  Diagnosis Date  . Anemia   . Anorexia   . Bacteremia due to Pseudomonas   . CHF (congestive heart failure) ([place])   . Complication of anesthesia    WITH LAST STENT 10/17. PASED OUT AND HAD TO BE AWAKENED  . Coronary artery disease involving left main coronary artery 01/2015   [facility]: 70% LM, p-mLAD 50-60% (Resting FFR 0.75), mRCA 80-90%, ~40 Ost OM & D1  . Dysrhythmia   . ESRD (end stage renal disease) on dialysis [facility])    ESRD secondary to acute kidney failure s/p CABG  DIALYSIS M/W/F  . Essential hypertension   . GERD (gastroesophageal reflux disease)   . Heart murmur   . Hypercholesterolemia   . Myocardial infarction ([place])   . 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 Marland Kitchen [place]: CABG x 3, LIMA to LAD, SVG to RCA, SVG to OM3, EVH  . Type II diabetes mellitus with complication [facility])    CAD    Patient Active Problem List   Diagnosis Date Noted  . Anesthesia complication 88/41/6606  . Chest pain with moderate risk for cardiac etiology 09/04/2016  . Steal syndrome dialysis vascular access, initial encounter ([place]) 06/18/2016  . Hypertension  02/02/2016  . Hyperlipidemia 02/02/2016  . Coronary artery disease involving left main coronary artery 10/04/2015  . Abdominal pain of unknown etiology 10/04/2015  . ESRD (end stage renal disease) on dialysis ([place])   . Type II diabetes mellitus with complication ([place])   . NSTEMI (non-ST elevated myocardial infarction) ([place])   . Right sided abdominal pain   . Elevated troponin 10/03/2015  . 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 ABDOMINAL HYSTERECTOMY    . AV FISTULA PLACEMENT Left 05/30/2016   Procedure: ARTERIOVENOUS graft;  Surgeon: Algernon Huxley, MD;  Location: [facility] ORS;  Service: Vascular;  Laterality: Left;  . CARDIAC CATHETERIZATION  01/2015   [facility]: Ost LM 70%, p-m LAD 50-60% (Rest FFR + @ 0.75), mRCA 80-90%, ostD1 40%, pOM1 40%  . CATARACT EXTRACTION W/PHACO Left 01/18/2016   Procedure: CATARACT EXTRACTION PHACO AND INTRAOCULAR LENS PLACEMENT (IOC);  Surgeon: Eulogio Bear, MD;  Location: [facility] ORS;  Service: Ophthalmology;  Laterality: Left;  [place] 1.05 AP% 15.5 CDE 10.16 Fluid Pack Lot # [id number]  . CATARACT EXTRACTION W/PHACO Right 08/01/2016   Procedure: CATARACT EXTRACTION PHACO AND INTRAOCULAR LENS PLACEMENT (IOC);  Surgeon: Eulogio Bear, MD;  Location: [facility] ORS;  Service: Ophthalmology;  Laterality: Right;  [place] 01:00.6 AP% 11.4 CDE 6.93  LOT # [id number]  . COLONOSCOPY    . CORONARY ANGIOPLASTY  SENTS 02/12/16  . CORONARY ARTERY BYPASS GRAFT  03/28/15    [facility]: Dr. Waldemar Dickens: LIMA to LAD, SVG to RCA, SVG to OM3, EVH  . EYE SURGERY    . INSERTION EXPRESS TUBE SHUNT Right 08/01/2016   Procedure: INSERTION EXPRESS TUBE SHUNT;  Surgeon: Eulogio Bear, MD;  Location: [facility] ORS;  Service: Ophthalmology;  Laterality: Right;  . INSERTION OF AHMED VALVE Left 08/15/2016   Procedure: INSERTION OF AHMED VALVE;  Surgeon: Eulogio Bear, MD;  Location: [facility] ORS;  Service: Ophthalmology;  Laterality: Left;  . INSERTION OF DIALYSIS  CATHETER    . TRANSTHORACIC ECHOCARDIOGRAM  January 2017    EF 60-65%. GR 2 DD. Mild degenerative mitral valve disease but no prolapse or regurgitation. Mild left atrial dilation. Mild to moderate LVH. Pericardial effusion gone  . UPPER EXTREMITY ANGIOGRAPHY Left 06/27/2016   Procedure: Upper Extremity Angiography;  Surgeon: Algernon Huxley, MD;  Location: [place] CV LAB;  Service: Cardiovascular;  Laterality: Left;  . UPPER EXTREMITY ANGIOGRAPHY Left 08/22/2016   Procedure: Upper Extremity Angiography;  Surgeon: Algernon Huxley, MD;  Location: [place] CV LAB;  Service: Cardiovascular;  Laterality: Left;    Allergies Chlorthalidone; Fentanyl; Midazolam; Ace inhibitors; Angiotensin receptor blockers; and Norvasc [amlodipine]  Social History Social History  Substance Use Topics  . Smoking status: Never Smoker  . Smokeless tobacco: Never Used  . Alcohol use No    Review of Systems Constitutional: Negative for fever. Cardiovascular: Negative for chest pain. Respiratory: Negative for shortness of breath. Gastrointestinal: Positive for abdominal pain, vomiting Genitourinary: Negative for dysuria. Musculoskeletal: Negative for back pain. Skin: Negative for rash. Neurological: Negative for headaches, focal weakness or numbness.  All systems negative/normal/unremarkable except as stated in the HPI  ____________________________________________   PHYSICAL EXAM:  VITAL SIGNS: ED Triage Vitals  Enc Vitals Group     BP 10/17/16 1140 (!) 206/70     Pulse Rate 10/17/16 1140 74     Resp 10/17/16 1140 18     Temp 10/17/16 1140 98.2 F (36.8 C)     Temp Source 10/17/16 1140 Oral     SpO2 10/17/16 1140 98 %     Weight 10/17/16 1141 127 lb (57.6 kg)     Height 10/17/16 1141 5' 4"  (1.626 m)     Head Circumference --      Peak Flow --      Pain Score --      Pain Loc --      Pain Edu? --      Excl. in [place]? --     Constitutional: Alert and oriented. Well appearing and in no  distress. Eyes: Conjunctivae are normal. Normal extraocular movements. ENT   Head: Normocephalic and atraumatic.   Nose: No congestion/rhinnorhea.   Mouth/Throat: Mucous membranes are moist.   Neck: No stridor. Cardiovascular: Normal rate, regular rhythm. No murmurs, rubs, or gallops. Respiratory: Normal respiratory effort without tachypnea nor retractions. Breath sounds are clear and equal bilaterally. No wheezes/rales/rhonchi. Gastrointestinal: Soft and nontender. Normal bowel sounds Musculoskeletal: Nontender with normal range of motion in extremities. No lower extremity tenderness nor edema. Neurologic:  Normal speech and language. No gross focal neurologic deficits are appreciated.  Skin:  Skin is warm, dry and intact. No rash noted. Psychiatric: Mood and affect are normal. Speech and behavior are normal.  ____________________________________________  ED COURSE:  Pertinent labs & imaging results that were available during my care of the patient were reviewed by me and considered in my  medical decision making (see chart for details). Patient presents for abdominal pain and nausea, we will assess with labs and imaging as indicated.   Procedures ____________________________________________   LABS (pertinent positives/negatives)  Labs Reviewed  HEPATIC FUNCTION PANEL - Abnormal; Notable for the following:       Result Value   Total Protein 8.2 (*)    ALT 56 (*)    Alkaline Phosphatase 149 (*)    Bilirubin, Direct <0.1 (*)    All other components within normal limits  URINE CULTURE  LIPASE, BLOOD  URINALYSIS, COMPLETE (UACMP) WITH MICROSCOPIC  CBG MONITORING, ED    RADIOLOGY  CT renal protocol IMPRESSION: 1. Small to moderate-sized midline incisional scar below the umbilicus containing mildly edematous fat. 2. Colonic diverticulosis without evidence of diverticulitis. 3. Air in the urinary bladder and upper pole right renal collecting system, most likely  due to recent bladder catheterization. 4. Interval 6 mm upper pole right renal calculus. 5. Mild to moderate diffuse bladder wall thickening with mild progression. This may be due to cystitis. 6. Extensive dense atheromatous arterial calcifications. 7. Small hiatal hernia. ____________________________________________  FINAL ASSESSMENT AND PLAN  Abdominal pain, vomiting, Cystitis with frank hematuria, hypertension  Plan: Patient's labs and imaging were dictated above. Patient had presented for hypertension and nausea but subsequently developed frank hematuria while in the ER. Initially I was not concerned about her blood pressure which I thought was related to her nausea and vomiting. She continued to have nausea and vomiting, we gave her multiple doses of IV antiemetics. We did order a urine culture and give IV antibiotics. She would benefit from hospital observation.   Earleen Newport, MD   Note: This note was generated in part or whole with voice recognition software. Voice recognition is usually quite accurate but there are transcription errors that can and very often do occur. I apologize for any typographical errors that were not detected and corrected.     Earleen Newport, MD 10/17/16 [phone]

## 2016-10-17 NOTE — Pre-Procedure Instructions (Signed)
Spoke with Dr. Randa Lynn, she compared today's EKG to one done on 10/03/15, OK to proceed, no further work up required.

## 2016-10-17 NOTE — Patient Instructions (Signed)
  Your procedure is scheduled OE:[id number] July 5 , 2018. Report to Same Day Surgery. To find out your arrival time please call [phone] between 1PM - 3PM on Tuesday October 22, 2016.  Remember: Instructions that are not followed completely may result in serious medical risk, up to and including death, or upon the discretion of your surgeon and anesthesiologist your surgery may need to be rescheduled.    _x___ 1. Do not eat food or drink liquids after midnight. No gum chewing or hard candies.     ____ 2. No Alcohol for 24 hours before or after surgery.   ____ 3. Bring all medications with you on the day of surgery if instructed.    __x__ 4. Notify your doctor if there is any change in your medical condition     (cold, fever, infections).    _____ 5. No smoking 24 hours prior to surgery.     Do not wear jewelry, make-up, hairpins, clips or nail polish.  Do not wear lotions, powders, or perfumes.   Do not shave 48 hours prior to surgery. Men may shave face and neck.  Do not bring valuables to the hospital.    [facility] is not responsible for any belongings or valuables.               Contacts, dentures or bridgework may not be worn into surgery.  Leave your suitcase in the car. After surgery it may be brought to your room.  For patients admitted to the hospital, discharge time is determined by your treatment team.   Patients discharged the day of surgery will not be allowed to drive home.    Please read over the following fact sheets that you were given:   [facility] Preparing for Surgery  _x__ Take these medicines the morning of surgery with A SIP OF WATER:    1. famotidine (PEPCID) and at bedtime on October 23, 2017.  2. isosorbide mononitrate (IMDUR)   3. metoprolol tartrate (LOPRESSOR)   ____ Fleet Enema (as directed)   _x_ Use Sage Wipes as directed on instruction sheet  ____ Use inhalers on the day of surgery and bring to hospital day of surgery  ____ Stop metformin 2  days prior to surgery    _x___ Take 1/2 of usual insulin dose the night before surgery and none on the morning of surgery.   ____ Stop Plavix on Saturday October 19, 2016.  __x__ Stop Anti-inflammatories such as Advil, Aleve, Ibuprofen, Motrin, Naproxen, Naprosyn, Goodies powders or aspirin  products. OK to take Tylenol.   ____ Stop supplements until after surgery.    ____ Bring C-Pap to the hospital.

## 2016-10-18 DIAGNOSIS — N186 End stage renal disease: Secondary | ICD-10-CM

## 2016-10-18 DIAGNOSIS — G458 Other transient cerebral ischemic attacks and related syndromes: Secondary | ICD-10-CM

## 2016-10-18 DIAGNOSIS — N390 Urinary tract infection, site not specified: Secondary | ICD-10-CM

## 2016-10-18 DIAGNOSIS — R310 Gross hematuria: Secondary | ICD-10-CM

## 2016-10-18 DIAGNOSIS — I10 Essential (primary) hypertension: Secondary | ICD-10-CM

## 2016-10-18 DIAGNOSIS — E119 Type 2 diabetes mellitus without complications: Secondary | ICD-10-CM

## 2016-10-18 LAB — BASIC METABOLIC PANEL
Anion gap: 8 (ref 5–15)
BUN: 32 mg/dL — ABNORMAL HIGH (ref 6–20)
CO2: 29 mmol/L (ref 22–32)
Calcium: 9.2 mg/dL (ref 8.9–10.3)
Chloride: 100 mmol/L — ABNORMAL LOW (ref 101–111)
Creatinine, Ser: 4.75 mg/dL — ABNORMAL HIGH (ref 0.44–1.00)
GFR calc Af Amer: 10 mL/min — ABNORMAL LOW (ref >60)
GFR calc non Af Amer: 9 mL/min — ABNORMAL LOW (ref >60)
Glucose, Bld: 81 mg/dL (ref 65–99)
Potassium: 3.8 mmol/L (ref 3.5–5.1)
Sodium: 137 mmol/L (ref 135–145)

## 2016-10-18 LAB — CBC
HCT: 34.1 % — ABNORMAL LOW (ref 35.0–47.0)
Hemoglobin: 11 g/dL — ABNORMAL LOW (ref 12.0–16.0)
MCH: 29.5 pg (ref 26.0–34.0)
MCHC: 32.3 g/dL (ref 32.0–36.0)
MCV: 91.4 fL (ref 80.0–100.0)
Platelets: 168 10*3/uL (ref 150–440)
RBC: 3.73 MIL/uL — ABNORMAL LOW (ref 3.80–5.20)
RDW: 20.4 % — ABNORMAL HIGH (ref 11.5–14.5)
WBC: 7.1 10*3/uL (ref 3.6–11.0)

## 2016-10-18 LAB — PHOSPHORUS: Phosphorus: 2 mg/dL — ABNORMAL LOW (ref 2.5–4.6)

## 2016-10-18 LAB — GLUCOSE, CAPILLARY
Glucose-Capillary: 69 mg/dL (ref 65–99)
Glucose-Capillary: 114 mg/dL — ABNORMAL HIGH (ref 65–99)
Glucose-Capillary: 137 mg/dL — ABNORMAL HIGH (ref 65–99)

## 2016-10-18 MED ORDER — ALTEPLASE 2 MG IJ SOLR: 2.0000 mg | Freq: Once | INTRAMUSCULAR | Status: DC | PRN

## 2016-10-18 MED ORDER — HEPARIN SODIUM (PORCINE) 1000 UNIT/ML DIALYSIS: 1000.0000 [IU] | INTRAMUSCULAR | Status: DC | PRN

## 2016-10-18 MED ORDER — SODIUM CHLORIDE 0.9 % IV SOLN: 100.0000 mL | INTRAVENOUS | Status: DC | PRN

## 2016-10-18 MED ORDER — FAMOTIDINE 20 MG PO TABS
20.0000 mg | ORAL_TABLET | Freq: Every day | ORAL | Status: DC
  Administered 2016-10-19: 20 mg via ORAL
  Filled 2016-10-18: qty 1

## 2016-10-18 MED ORDER — LIDOCAINE HCL (PF) 1 % IJ SOLN
5.0000 mL | INTRAMUSCULAR | Status: DC | PRN
  Filled 2016-10-18: qty 5

## 2016-10-18 MED ORDER — PENTAFLUOROPROP-TETRAFLUOROETH EX AERO
1.0000 "application " | INHALATION_SPRAY | CUTANEOUS | Status: DC | PRN
  Filled 2016-10-18: qty 30

## 2016-10-18 MED ORDER — HYDRALAZINE HCL 20 MG/ML IJ SOLN: 10.0000 mg | Freq: Four times a day (QID) | INTRAMUSCULAR | Status: DC | PRN

## 2016-10-18 MED ORDER — LIDOCAINE-PRILOCAINE 2.5-2.5 % EX CREA
1.0000 "application " | TOPICAL_CREAM | CUTANEOUS | Status: DC | PRN
  Filled 2016-10-18: qty 5

## 2016-10-18 NOTE — Progress Notes (Signed)
[place] [facility] [place] NAME: Stephanie Ellis    MR#:  [id number]  DATE OF BIRTH:  04/29/1950  SUBJECTIVE:  CHIEF COMPLAINT:   Chief Complaint  Patient presents with  . Hypertension  Still having persistent hematuria. REVIEW OF SYSTEMS:  Review of Systems  Constitutional: Negative for chills, fever and weight loss.  HENT: Negative for nosebleeds and sore throat.   Eyes: Negative for blurred vision.  Respiratory: Negative for cough, shortness of breath and wheezing.   Cardiovascular: Negative for chest pain, orthopnea, leg swelling and PND.  Gastrointestinal: Negative for abdominal pain, constipation, diarrhea, heartburn, nausea and vomiting.  Genitourinary: Positive for hematuria. Negative for dysuria and urgency.  Musculoskeletal: Negative for back pain.  Skin: Negative for rash.  Neurological: Negative for dizziness, speech change, focal weakness and headaches.  Endo/Heme/Allergies: Does not bruise/bleed easily.  Psychiatric/Behavioral: Negative for depression.    DRUG ALLERGIES:   Allergies  Allergen Reactions  . Chlorthalidone Anaphylaxis, Itching and Rash  . Fentanyl Rash  . Midazolam Rash  . Ace Inhibitors Other (See Comments)    Reaction:  Hyperkalemia, agitation   . Angiotensin Receptor Blockers Other (See Comments)    Reaction:  Hyperkalemia, agitation   . Norvasc [Amlodipine] Itching and Rash   VITALS:  Blood pressure 93/73, pulse 62, temperature 98.5 F (36.9 C), temperature source Oral, resp. rate 18, height 5' 4"  (1.626 m), weight 58.7 kg (129 lb 8 oz), SpO2 100 %. PHYSICAL EXAMINATION:  Physical Exam  Constitutional: She is oriented to person, place, and time and well-developed, well-nourished, and in no distress.  HENT:  Head: Normocephalic and atraumatic.  Eyes: Conjunctivae and EOM are normal. Pupils are equal, round, and reactive to light.  Neck: Normal range of motion. Neck supple. No tracheal deviation  present. No thyromegaly present.  Cardiovascular: Normal rate, regular rhythm and normal heart sounds.   Pulmonary/Chest: Effort normal and breath sounds normal. No respiratory distress. She has no wheezes. She exhibits no tenderness.  Abdominal: Soft. Bowel sounds are normal. She exhibits no distension. There is no tenderness.  Musculoskeletal: Normal range of motion.  Neurological: She is alert and oriented to person, place, and time. No cranial nerve deficit.  Skin: Skin is warm and dry. No rash noted.  Psychiatric: Mood and affect normal.   LABORATORY PANEL:  Female CBC  Recent Labs Lab 10/18/16 0321  WBC 7.1  HGB 11.0*  HCT 34.1*  PLT 168   ------------------------------------------------------------------------------------------------------------------ Chemistries   Recent Labs Lab 10/17/16 1009 10/18/16 0321  NA 137 137  K 4.3 3.8  CL 97* 100*  CO2 29 29  GLUCOSE 166* 81  BUN 24* 32*  CREATININE 4.34* 4.75*  CALCIUM 9.9 9.2  AST 35  --   ALT 56*  --   ALKPHOS 149*  --   BILITOT 0.6  --    RADIOLOGY:  Ct Renal Stone Study  Result Date: 10/17/2016 CLINICAL DATA:  Left lower quadrant abdominal pain and nausea. EXAM: CT ABDOMEN AND PELVIS WITHOUT CONTRAST TECHNIQUE: Multidetector CT imaging of the abdomen and pelvis was performed following the standard protocol without IV contrast. COMPARISON:  10/03/2015. FINDINGS: Lower chest: Enlarged heart with mild progression. No significant change in bibasilar pleural-parenchymal scarring. Hepatobiliary: Small, poorly defined gallstones in the dependent portion of the gallbladder, the largest measuring 3 mm. No gallbladder wall thickening or pericholecystic fluid. Normal appearing liver. Pancreas: Unremarkable. No pancreatic ductal dilatation or surrounding inflammatory changes. Spleen: Vascular calcifications and possible calcified  granulomata. Normal size and shape. Adrenals/Urinary Tract: Normal appearing adrenal glands.  Bilateral renal artery branch calcifications, making it difficult to evaluate for calculi. There are probably some small calculi. Air in the upper pole renal collecting system on the right. There is also a 6 mm probable calculus within the collecting system on image number 21 of series 2, not previously present. Air in the urinary bladder. Mild diffuse bladder wall thickening with progression. No bladder or ureteral calculi seen. No hydronephrosis. Stomach/Bowel: Multiple colonic diverticula without evidence of diverticulitis. Small hiatal hernia. Unremarkable small bowel and appendix. Vascular/Lymphatic: Extensive atheromatous arterial calcifications without aneurysm. No enlarged lymph nodes. Reproductive: Surgically absent uterus.  No adnexal masses. Other: Midline surgical scar with and infraumbilical midline hernia containing mildly edematous fat. Tiny umbilical hernia containing fat. Musculoskeletal: Lumbar and lower thoracic spine degenerative changes, most pronounced involving the facet joints at multiple levels. IMPRESSION: 1. Small to moderate-sized midline incisional scar below the umbilicus containing mildly edematous fat. 2. Colonic diverticulosis without evidence of diverticulitis. 3. Air in the urinary bladder and upper pole right renal collecting system, most likely due to recent bladder catheterization. 4. Interval 6 mm upper pole right renal calculus. 5. Mild to moderate diffuse bladder wall thickening with mild progression. This may be due to cystitis. 6. Extensive dense atheromatous arterial calcifications. 7. Small hiatal hernia. Electronically Signed   By: Claudie Revering M.D.   On: 10/17/2016 16:26   ASSESSMENT AND PLAN:   * Hematuria, UTI and cystitis. - continue  IV ceftriaxone, pending urine culture.   Hold aspirin and Plavix from today onwards as she was supposed to stop it after 2 days for her scheduled procedure next week.  We will go and consult vascular surgery just  If in case They  want to perform the procedure While she is here - [place] urology input.  Recommend outpatient cystoscopy.  * End-stage renal disease on hemodialysis   Nephrology consult for inpatient hemodialysis  * Coronary artery disease status post CABG last surgery was more than one year ago.   Hold aspirin and Plavix right now because of bleeding and planned surgery for next week.  * Hypotension with a history of Hypertension - .  Stop metoprolol, continue Imdur.  If still low, we can consider stopping this as well   * Diabetes   Continue basal insulin  Lantus 14 units subcutaneous daily and keep on sliding scale coverage.     All the records are reviewed and case discussed with Care Management/Social Worker. Management plans discussed with the patient,.  Nursing  and they are in agreement.  CODE STATUS: Full Code  TOTAL TIME TAKING CARE OF THIS PATIENT: 35  minutes.   More than 50% of the time was spent in counseling/coordination of care: YES  POSSIBLE D/C IN 1-2  DAYS, DEPENDING ON CLINICAL CONDITION.   Max Sane M.D on 10/18/2016 at 10:47 AM  Between 7am to 6pm - Pager - [phone]  After 6pm go to [email or web address] - Proofreader  [facility]  Office  [phone]  CC: Primary care physician; System, Pcp Not In  Note: This dictation was prepared with Dragon dictation along with smaller phrase technology. Any transcriptional errors that result from this process are unintentional.

## 2016-10-18 NOTE — Consult Note (Signed)
[facility] Vascular Consult Note  MRN : [id number]  Stephanie Ellis is a 66 y.o. ([birth year]) female who presents with chief complaint of  Chief Complaint  Patient presents with  . Hypertension   History of Present Illness:   The patient is a 66 year old female well known to our service admitted to the medicine service for cystitis and hematuria.   Patient has not had another episode of hematuria. She is without complaint this AM. Her right IJ Permcath is functioning well. She is scheduled to under go a left upper extremity graft banding on 10/24/16 for suspected steal syndrome. I spent at least 15 minutes in the patients room discussing her upcoming surgery (procedure, risks and benefits). She expresses her understanding and wants to proceed. We were consulted by Dr. Manuella Ghazi for access evaluation.  Current Facility-Administered Medications  Medication Dose Route Frequency Provider Last Rate Last Dose  . atorvastatin (LIPITOR) tablet 40 mg  40 mg Oral q1800 Vaughan Basta, MD      . cefTRIAXone (ROCEPHIN) 1 g in dextrose 5 % 50 mL IVPB  1 g Intravenous Q24H Vaughan Basta, MD      . cholecalciferol (VITAMIN D) tablet 400 Units  400 Units Oral Daily Vaughan Basta, MD   400 Units at 10/18/16 1039  . docusate sodium (COLACE) capsule 100 mg  100 mg Oral BID PRN Vaughan Basta, MD      . famotidine (PEPCID) tablet 20 mg  20 mg Oral BID Vaughan Basta, MD   20 mg at 10/18/16 1039  . insulin aspart (novoLOG) injection 0-9 Units  0-9 Units Subcutaneous TID WC Vaughan Basta, MD      . insulin glargine (LANTUS) injection 14 Units  14 Units Subcutaneous q1800 Vaughan Basta, MD   14 Units at 10/17/16 2234  . isosorbide mononitrate (IMDUR) 24 hr tablet 30 mg  30 mg Oral Daily Vaughan Basta, MD   30 mg at 10/18/16 1039  . multivitamin with minerals tablet 2 tablet  2 tablet Oral Daily Vaughan Basta, MD   2  tablet at 10/18/16 1039  . nitroGLYCERIN (NITROSTAT) SL tablet 0.4 mg  0.4 mg Sublingual Q5 min PRN Vaughan Basta, MD      . ondansetron [facility]) tablet 4 mg  4 mg Oral Q8H PRN Vaughan Basta, MD      . sevelamer carbonate (RENVELA) tablet 800 mg  800 mg Oral TID WC Vaughan Basta, MD   800 mg at 10/18/16 1039   Past Medical History:  Diagnosis Date  . Anemia   . Anorexia   . Bacteremia due to Pseudomonas   . CHF (congestive heart failure) ([place])   . Complication of anesthesia    WITH LAST STENT 10/17. PASED OUT AND HAD TO BE AWAKENED  . Coronary artery disease involving left main coronary artery 01/2015   [facility]: 70% LM, p-mLAD 50-60% (Resting FFR 0.75), mRCA 80-90%, ~40 Ost OM & D1  . Dysrhythmia   . ESRD (end stage renal disease) on dialysis [facility])    ESRD secondary to acute kidney failure s/p CABG  DIALYSIS M/W/F  . Essential hypertension   . GERD (gastroesophageal reflux disease)   . Heart murmur   . Hypercholesterolemia   . Myocardial infarction ([place])   . 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 Marland Kitchen Haithcock: CABG x 3, LIMA to LAD, SVG to RCA, SVG to OM3, EVH  . Type II diabetes mellitus with complication (HCC)    CAD  Past Surgical History:  Procedure Laterality Date  . ABDOMINAL HYSTERECTOMY    . AV FISTULA PLACEMENT Left 05/30/2016   Procedure: ARTERIOVENOUS graft;  Surgeon: Algernon Huxley, MD;  Location: [facility] ORS;  Service: Vascular;  Laterality: Left;  . CARDIAC CATHETERIZATION  01/2015   [facility]: Ost LM 70%, p-m LAD 50-60% (Rest FFR + @ 0.75), mRCA 80-90%, ostD1 40%, pOM1 40%  . CATARACT EXTRACTION W/PHACO Left 01/18/2016   Procedure: CATARACT EXTRACTION PHACO AND INTRAOCULAR LENS PLACEMENT (IOC);  Surgeon: Eulogio Bear, MD;  Location: [facility] ORS;  Service: Ophthalmology;  Laterality: Left;  [place] 1.05 AP% 15.5 CDE 10.16 Fluid Pack Lot # [id number]  . CATARACT EXTRACTION W/PHACO Right 08/01/2016   Procedure: CATARACT EXTRACTION PHACO AND INTRAOCULAR LENS  PLACEMENT (IOC);  Surgeon: Eulogio Bear, MD;  Location: [facility] ORS;  Service: Ophthalmology;  Laterality: Right;  [place] 01:00.6 AP% 11.4 CDE 6.93  LOT # [id number]  . COLONOSCOPY    . CORONARY ANGIOPLASTY     SENTS 02/12/16  . CORONARY ARTERY BYPASS GRAFT  03/28/15    [facility]: Dr. Waldemar Dickens: LIMA to LAD, SVG to RCA, SVG to OM3, EVH  . EYE SURGERY    . INSERTION EXPRESS TUBE SHUNT Right 08/01/2016   Procedure: INSERTION EXPRESS TUBE SHUNT;  Surgeon: Eulogio Bear, MD;  Location: [facility] ORS;  Service: Ophthalmology;  Laterality: Right;  . INSERTION OF AHMED VALVE Left 08/15/2016   Procedure: INSERTION OF AHMED VALVE;  Surgeon: Eulogio Bear, MD;  Location: [facility] ORS;  Service: Ophthalmology;  Laterality: Left;  . INSERTION OF DIALYSIS CATHETER    . TRANSTHORACIC ECHOCARDIOGRAM  January 2017    EF 60-65%. GR 2 DD. Mild degenerative mitral valve disease but no prolapse or regurgitation. Mild left atrial dilation. Mild to moderate LVH. Pericardial effusion gone  . UPPER EXTREMITY ANGIOGRAPHY Left 06/27/2016   Procedure: Upper Extremity Angiography;  Surgeon: Algernon Huxley, MD;  Location: [place] [facility];  Service: Cardiovascular;  Laterality: Left;  . UPPER EXTREMITY ANGIOGRAPHY Left 08/22/2016   Procedure: Upper Extremity Angiography;  Surgeon: Algernon Huxley, MD;  Location: [place] [facility];  Service: Cardiovascular;  Laterality: Left;   Social History Social History  Substance Use Topics  . Smoking status: Never Smoker  . Smokeless tobacco: Never Used  . Alcohol use No   Family History Family History  Problem Relation Age of Onset  . Diabetes Mellitus II Mother   Denies family history of peripheral artery disease, venous disease or renal disease  Allergies  Allergen Reactions  . Chlorthalidone Anaphylaxis, Itching and Rash  . Fentanyl Rash  . Midazolam Rash  . Ace Inhibitors Other (See Comments)    Reaction:  Hyperkalemia, agitation   . Angiotensin Receptor Blockers Other (See  Comments)    Reaction:  Hyperkalemia, agitation   . Norvasc [Amlodipine] Itching and Rash   REVIEW OF SYSTEMS (Negative unless checked)  Constitutional: [] Weight loss  [] Fever  [] Chills Cardiac: [] Chest pain   [] Chest pressure   [] Palpitations   [] Shortness of breath when laying flat   [] Shortness of breath at rest   [] Shortness of breath with exertion. Vascular:  [] Pain in legs with walking   [] Pain in legs at rest   [] Pain in legs when laying flat   [] Claudication   [] Pain in feet when walking  [] Pain in feet at rest  [] Pain in feet when laying flat   [] History of DVT   [] Phlebitis   [] Swelling in legs   [] Varicose veins   []   Non-healing ulcers Pulmonary:   [] Uses home oxygen   [] Productive cough   [] Hemoptysis   [] Wheeze  [] COPD   [] Asthma Neurologic:  [] Dizziness  [] Blackouts   [] Seizures   [] History of stroke   [] History of TIA  [] Aphasia   [] Temporary blindness   [] Dysphagia   [] Weakness or numbness in arms   [] Weakness or numbness in legs Musculoskeletal:  [] Arthritis   [] Joint swelling   [] Joint pain   [] Low back pain Hematologic:  [] Easy bruising  [] Easy bleeding   [] Hypercoagulable state   [] Anemic  [] Hepatitis Gastrointestinal:  [] Blood in stool   [] Vomiting blood  [] Gastroesophageal reflux/heartburn   [] Difficulty swallowing. Genitourinary:  [x] Chronic kidney disease   [x] Difficult urination  [] Frequent urination  [] Burning with urination   [x] Blood in urine Skin:  [] Rashes   [] Ulcers   [] Wounds Psychological:  [] History of anxiety   []  History of major depression.  Physical Examination  Vitals:   10/17/16 1423 10/17/16 1948 10/18/16 0417 10/18/16 0500  BP: (!) 173/67 (!) 167/80 93/73   Pulse: 69 72 62   Resp: 17 18 18    Temp:   98.5 F (36.9 C)   TempSrc:   Oral   SpO2: 95% 100% 100%   Weight:    129 lb 8 oz (58.7 kg)  Height:    5' 4"  (1.626 m)   Body mass index is 22.23 kg/m. Gen:  WD/WN, NAD Head: [place]/AT, No temporalis wasting. Prominent temp pulse not  noted. Ear/Nose/Throat: Hearing grossly intact, nares w/o erythema or drainage, oropharynx w/o Erythema/Exudate Eyes: Sclera non-icteric, conjunctiva clear Neck: Trachea midline.  No JVD.  Pulmonary:  Good air movement, respirations not labored, equal bilaterally.  Cardiac: RRR, normal S1, S2. Vascular:  Vessel Right Left  Radial Palpable Palpable  Ulnar Palpable Palpable  Brachial Palpable Palpable  Carotid Palpable, without bruit Palpable, without bruit  Aorta Not palpable N/A  Femoral Palpable Palpable  Popliteal Palpable Palpable  PT Palpable Palpable  DP Palpable Palpable   Right IJ Permcath: Intact, no signs of infection noted. Left Upper Extremity Loop Graft: Bruit and thrill noted.  Gastrointestinal: soft, non-tender/non-distended. No guarding/reflex.  Musculoskeletal: M/S 5/5 throughout.  Extremities without ischemic changes.  No deformity or atrophy. No edema. Neurologic: Sensation grossly intact in extremities.  Symmetrical.  Speech is fluent. Motor exam as listed above. Psychiatric: Judgment intact, Mood & affect appropriate for pt's clinical situation. Dermatologic: No rashes or ulcers noted.  No cellulitis or open wounds. Lymph : No Cervical, Axillary, or Inguinal lymphadenopathy.  CBC Lab Results  Component Value Date   WBC 7.1 10/18/2016   HGB 11.0 (L) 10/18/2016   HCT 34.1 (L) 10/18/2016   MCV 91.4 10/18/2016   PLT 168 10/18/2016   BMET    Component Value Date/Time   NA 137 10/18/2016 0321   K 3.8 10/18/2016 0321   CL 100 (L) 10/18/2016 0321   CO2 29 10/18/2016 0321   GLUCOSE 81 10/18/2016 0321   BUN 32 (H) 10/18/2016 0321   CREATININE 4.75 (H) 10/18/2016 0321   CALCIUM 9.2 10/18/2016 0321   GFRNONAA 9 (L) 10/18/2016 0321   GFRAA 10 (L) 10/18/2016 0321   Estimated Creatinine Clearance: 10.2 mL/min (A) (by C-G formula based on SCr of 4.75 mg/dL (H)).  COAG Lab Results  Component Value Date   INR 1.03 10/17/2016   INR 0.95 05/30/2016   INR 0.99  02/08/2016   Radiology Ct Renal Stone Study  Result Date: 10/17/2016 CLINICAL DATA:  Left lower quadrant abdominal pain  and nausea. EXAM: CT ABDOMEN AND PELVIS WITHOUT CONTRAST TECHNIQUE: Multidetector CT imaging of the abdomen and pelvis was performed following the standard protocol without IV contrast. COMPARISON:  10/03/2015. FINDINGS: Lower chest: Enlarged heart with mild progression. No significant change in bibasilar pleural-parenchymal scarring. Hepatobiliary: Small, poorly defined gallstones in the dependent portion of the gallbladder, the largest measuring 3 mm. No gallbladder wall thickening or pericholecystic fluid. Normal appearing liver. Pancreas: Unremarkable. No pancreatic ductal dilatation or surrounding inflammatory changes. Spleen: Vascular calcifications and possible calcified granulomata. Normal size and shape. Adrenals/Urinary Tract: Normal appearing adrenal glands. Bilateral renal artery branch calcifications, making it difficult to evaluate for calculi. There are probably some small calculi. Air in the upper pole renal collecting system on the right. There is also a 6 mm probable calculus within the collecting system on image number 21 of series 2, not previously present. Air in the urinary bladder. Mild diffuse bladder wall thickening with progression. No bladder or ureteral calculi seen. No hydronephrosis. Stomach/Bowel: Multiple colonic diverticula without evidence of diverticulitis. Small hiatal hernia. Unremarkable small bowel and appendix. Vascular/Lymphatic: Extensive atheromatous arterial calcifications without aneurysm. No enlarged lymph nodes. Reproductive: Surgically absent uterus.  No adnexal masses. Other: Midline surgical scar with and infraumbilical midline hernia containing mildly edematous fat. Tiny umbilical hernia containing fat. Musculoskeletal: Lumbar and lower thoracic spine degenerative changes, most pronounced involving the facet joints at multiple levels.  IMPRESSION: 1. Small to moderate-sized midline incisional scar below the umbilicus containing mildly edematous fat. 2. Colonic diverticulosis without evidence of diverticulitis. 3. Air in the urinary bladder and upper pole right renal collecting system, most likely due to recent bladder catheterization. 4. Interval 6 mm upper pole right renal calculus. 5. Mild to moderate diffuse bladder wall thickening with mild progression. This may be due to cystitis. 6. Extensive dense atheromatous arterial calcifications. 7. Small hiatal hernia. Electronically Signed   By: Claudie Revering M.D.   On: 10/17/2016 16:26   Assessment/Plan Patient is a 66 year old female admitted for hematuria - Stable 1. ESRD: Will plan on banding with Dr. Lucky Cowboy 10/24/16 for possible steal syndrome. There is no plan for surgery during inpatient stay. There is no acute vascular compromise to the patients hand. Patient to continue dialyze with right IJ permcath.  2. Diabetes Type 2: On appropriate meds. Encouraged good control as its slows the progression of atherosclerotic disease 3. Hypertension: On appropriate meds. Encouraged good control as its slows the progression of atherosclerotic disease 4. Hyperlipidemia: On appropriate meds. Encouraged good control as its slows the progression of atherosclerotic disease  Discussed with Dr. Mayme Genta, PA-C  10/18/2016 11:03 AM

## 2016-10-18 NOTE — Progress Notes (Signed)
HD COMPLETED  

## 2016-10-18 NOTE — Progress Notes (Signed)
HD STARTED  

## 2016-10-18 NOTE — Progress Notes (Signed)
PRE DIALYSIS ASSESSMENT 

## 2016-10-18 NOTE — Consult Note (Signed)
10/18/2016 8:53 AM   Stephanie Ellis December 08, 1950 [id number]  Referring provider: Dr. Carlynn Spry  CC: Gross hematuria  HPI: The patient is a 66 year old female with multiple medical comorbidities including end-stage renal disease on hemodialysis who was admitted to hospital after he found to be hypertensive preadmission testing. Urology was consulted for new onset gross hematuria. She was initially seen yesterday in preadmission testing for her upcoming AV fistula revision surgery. Lone preadmission testing, she had her first episode of gross hematuria was admitted to the hospital due to her elevated blood pressure. She did undergo a CT scan scan without contrast which was negative for adverse pathology including no stones or obvious tumors or hydronephrosis. It did show bladder wall thickening concerning for cystitis. Her urinalysis was difficult to interpret due to the 2 numerous to count red and white blood cells but there was bacteria. Nitrate and leukocyte tests were not possible to be performed. She doesn't I dysuria at this time. She has no suprapubic tenderness or flank pain. She denies fevers or chills. Up until yesterday, she denies any history of gross hematuria. She does not void clots currently. She reports that she voids twice a day for approximately 10-12 ounces total per day. She receives dialysis every Monday Wednesday and Friday. She does normally take aspirin and Plavix, and those were stopped yesterday for upcoming surgery.   PMH: Past Medical History:  Diagnosis Date  . Anemia   . Anorexia   . Bacteremia due to Pseudomonas   . CHF (congestive heart failure) ([place])   . Complication of anesthesia    WITH LAST STENT 10/17. PASED OUT AND HAD TO BE AWAKENED  . Coronary artery disease involving left main coronary artery 01/2015   [facility]: 70% LM, p-mLAD 50-60% (Resting FFR 0.75), mRCA 80-90%, ~40 Ost OM & D1  . Dysrhythmia   . ESRD (end stage renal disease) on dialysis [facility])    ESRD  secondary to acute kidney failure s/p CABG  DIALYSIS M/W/F  . Essential hypertension   . GERD (gastroesophageal reflux disease)   . Heart murmur   . Hypercholesterolemia   . Myocardial infarction ([place])   . 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 Marland Kitchen Haithcock: CABG x 3, LIMA to LAD, SVG to RCA, SVG to OM3, EVH  . Type II diabetes mellitus with complication [facility])    CAD    Surgical History: Past Surgical History:  Procedure Laterality Date  . ABDOMINAL HYSTERECTOMY    . AV FISTULA PLACEMENT Left 05/30/2016   Procedure: ARTERIOVENOUS graft;  Surgeon: Algernon Huxley, MD;  Location: [facility] ORS;  Service: Vascular;  Laterality: Left;  . CARDIAC CATHETERIZATION  01/2015   [facility]: Ost LM 70%, p-m LAD 50-60% (Rest FFR + @ 0.75), mRCA 80-90%, ostD1 40%, pOM1 40%  . CATARACT EXTRACTION W/PHACO Left 01/18/2016   Procedure: CATARACT EXTRACTION PHACO AND INTRAOCULAR LENS PLACEMENT (IOC);  Surgeon: Eulogio Bear, MD;  Location: [facility] ORS;  Service: Ophthalmology;  Laterality: Left;  [place] 1.05 AP% 15.5 CDE 10.16 Fluid Pack Lot # [id number]  . CATARACT EXTRACTION W/PHACO Right 08/01/2016   Procedure: CATARACT EXTRACTION PHACO AND INTRAOCULAR LENS PLACEMENT (IOC);  Surgeon: Eulogio Bear, MD;  Location: [facility] ORS;  Service: Ophthalmology;  Laterality: Right;  [place] 01:00.6 AP% 11.4 CDE 6.93  LOT # [id number]  . COLONOSCOPY    . CORONARY ANGIOPLASTY     SENTS 02/12/16  . CORONARY ARTERY BYPASS GRAFT  03/28/15    [facility]: Dr.  Haithcock: LIMA to LAD, SVG to RCA, SVG to OM3, EVH  . EYE SURGERY    . INSERTION EXPRESS TUBE SHUNT Right 08/01/2016   Procedure: INSERTION EXPRESS TUBE SHUNT;  Surgeon: Eulogio Bear, MD;  Location: [facility] OR[facility];  Service: Ophthalmology;  Laterality: Right;  . INSERTION OF AHMED VALVE Left 08/15/2016   Procedure: INSERTION OF AHMED VALVE;  Surgeon: Eulogio Bear, MD;  Location: [facility] OR[facility];  Service: Ophthalmology;  Laterality: Left;  . INSERTION OF DIALYSIS CATHETER    . TRANSTHORACIC  ECHOCARDIOGRAM  January 2017    EF 60-65%. GR 2 DD. Mild degenerative mitral valve disease but no prolapse or regurgitation. Mild left atrial dilation. Mild to moderate LVH. Pericardial effusion gone  . UPPER EXTREMITY ANGIOGRAPHY Left 06/27/2016   Procedure: Upper Extremity Angiography;  Surgeon: Algernon Huxley, MD;  Location: [place] [facility];  Service: Cardiovascular;  Laterality: Left;  . UPPER EXTREMITY ANGIOGRAPHY Left 08/22/2016   Procedure: Upper Extremity Angiography;  Surgeon: Algernon Huxley, MD;  Location: [place] [facility];  Service: Cardiovascular;  Laterality: Left;     Allergies:  Allergies  Allergen Reactions  . Chlorthalidone Anaphylaxis, Itching and Rash  . Fentanyl Rash  . Midazolam Rash  . Ace Inhibitors Other (See Comments)    Reaction:  Hyperkalemia, agitation   . Angiotensin Receptor Blockers Other (See Comments)    Reaction:  Hyperkalemia, agitation   . Norvasc [Amlodipine] Itching and Rash    Family History: Family History  Problem Relation Age of Onset  . Diabetes Mellitus II Mother     Social History:  reports that she has never smoked. She has never used smokeless tobacco. She reports that she does not drink alcohol or use drugs.  ROS: 12 point ROS negative as above  Physical Exam: BP 93/73 (BP Location: Right Arm)   Pulse 62   Temp 98.5 F (36.9 C) (Oral)   Resp 18   Ht 5' 4"  (1.626 m)   Wt 129 lb 8 oz (58.7 kg)   SpO2 100%   BMI 22.23 kg/m   Constitutional:  Alert and oriented, No acute distress. HEENT: [place] AT, moist mucus membranes.  Trachea midline, no masses. Cardiovascular: No clubbing, cyanosis, or edema. Respiratory: Normal respiratory effort, no increased work of breathing. GI: Abdomen is soft, nontender, nondistended, no abdominal masses GU: No CVA tenderness.  Skin: No rashes, bruises or suspicious lesions. Lymph: No cervical or inguinal adenopathy. Neurologic: Grossly intact, no focal deficits, moving all 4  extremities. Psychiatric: Normal mood and affect.  Laboratory Data: Lab Results  Component Value Date   WBC 7.1 10/18/2016   HGB 11.0 (L) 10/18/2016   HCT 34.1 (L) 10/18/2016   MCV 91.4 10/18/2016   PLT 168 10/18/2016    Lab Results  Component Value Date   CREATININE 4.75 (H) 10/18/2016    No results found for: PSA  No results found for: TESTOSTERONE  Lab Results  Component Value Date   HGBA1C 7.0 (H) 10/03/2015    Urinalysis    Component Value Date/Time   COLORURINE RED (A) 10/17/2016 1643   APPEARANCEUR TURBID (A) 10/17/2016 1643   LABSPEC 1.014 10/17/2016 1643   PHURINE  10/17/2016 1643    TEST NOT REPORTED DUE TO COLOR INTERFERENCE OF URINE PIGMENT   GLUCOSEU (A) 10/17/2016 1643    TEST NOT REPORTED DUE TO COLOR INTERFERENCE OF URINE PIGMENT   HGBUR (A) 10/17/2016 1643    TEST NOT REPORTED DUE TO COLOR INTERFERENCE OF URINE PIGMENT  BILIRUBINUR (A) 10/17/2016 1643    TEST NOT REPORTED DUE TO COLOR INTERFERENCE OF URINE PIGMENT   KETONESUR (A) 10/17/2016 1643    TEST NOT REPORTED DUE TO COLOR INTERFERENCE OF URINE PIGMENT   PROTEINUR (A) 10/17/2016 1643    TEST NOT REPORTED DUE TO COLOR INTERFERENCE OF URINE PIGMENT   NITRITE (A) 10/17/2016 1643    TEST NOT REPORTED DUE TO COLOR INTERFERENCE OF URINE PIGMENT   LEUKOCYTESUR (A) 10/17/2016 1643    TEST NOT REPORTED DUE TO COLOR INTERFERENCE OF URINE PIGMENT    Pertinent Imaging: CT reviewed as above  Assessment & Plan:    1. Gross hematuria I discussed with the patient the possible etiologies of her gross hematuria. I also discussed completion of her workup via cystoscopy. I do not recommend performing cystoscopy at this time as she may have an active urinary tract infection suggested possibly by her UA and CT scan that she does not have significant symptoms at this time. At this point, I feel it best to have her undergo office cystoscopy in approximately one month. We will arrange for her to have this  performed in our office. I expect for her hematuria to look worse than it actually is due to her low urine output, so her hematuria is not diluted as much as it would be in a person who makes a normal amount of urine. She does not need any urgent intervention for this as she long as she is able to empty her bladder without clot retention.  2. UTI Recommend continuing antibiotic pending culture and sensitivity results.  3. ESRD Continue dialysis per nephrology  Nickie Retort, [facility] [address] [place], [place] [address] [phone]

## 2016-10-18 NOTE — Consult Note (Signed)
[facility] CONSULT NOTE    Date: 10/18/2016                  Patient Name:  Stephanie Ellis  MRN: [id number]  DOB: 1950-05-10  Age / Sex: 66 y.o., female         PCP: System, Pcp Not In                 Service Requesting Consult: hospitalist                 Reason for Consult: Management of ESRD            History of Present Illness: Patient is a 66 y.o. female with a PMHx of ESRD on HD MWF followed by [facility] nephrology, anemia of chronic kidney disease, secondary hyperparathyroidism, congestive heart failure, coronary artery disease status post three-vessel CABG in 2016, hypertension, GERD, hyperlipidemia, myocardial infarction, steal syndrome of the left upper extremity AV graft, who was admitted to [facility] on 10/17/2016 for evaluation of gross hematuria.  CT scan of the abdomen and pelvis suggested underlying cystitis. In addition she has been taking aspirin and Plavix. She normally dialyzes on Monday, Wednesday, Friday. She is due for hemodialysis today. In addition she's been having steal syndrome in her left upper extremity. She will be having additional vascular procedure on 10/24/2016. She takes Renvela for her underlying secondary hyperparathyroidism.   Medications: Outpatient medications: Prescriptions Prior to Admission  Medication Sig Dispense Refill Last Dose  . aspirin EC 81 MG tablet Take 81 mg by mouth daily.   10/17/2016 at am  . atorvastatin (LIPITOR) 40 MG tablet Take 40 mg by mouth daily at 6 PM.    10/16/2016 at p  . cholecalciferol (VITAMIN D) 400 units TABS tablet Take 400 Units by mouth daily.    10/16/2016 at am  . clopidogrel (PLAVIX) 75 MG tablet Take 1 tablet (75 mg total) by mouth daily. 30 tablet 0 10/17/2016 at am  . Difluprednate (DUREZOL) 0.05 % EMUL Apply 1 drop to eye 4 (four) times daily.   10/17/2016 at am  . famotidine (PEPCID) 20 MG tablet Take 1 tablet (20 mg total) by mouth 2 (two) times daily. (Patient taking differently: Take 20 mg  by mouth daily as needed for heartburn. ) 60 tablet 0 prn at prn  . insulin glargine (LANTUS) 100 unit/mL SOPN Inject 14 Units into the skin every evening.    10/16/2016 at qhs   . isosorbide mononitrate (IMDUR) 30 MG 24 hr tablet Take 1 tablet (30 mg total) by mouth daily. 30 tablet 0 10/17/2016 at am  . metoprolol tartrate (LOPRESSOR) 25 MG tablet 1/2 tablet po twice a day (Patient taking differently: Take 12.5 mg by mouth 2 (two) times daily. 1/2 tablet po twice a day) 30 tablet 0 10/17/2016 at am  . Multiple Vitamin (MULTIVITAMIN WITH MINERALS) TABS tablet Take 2 tablets by mouth daily. Gummy vitamins   10/17/2016 at am  . nitroGLYCERIN (NITROSTAT) 0.4 MG SL tablet Place 1 tablet (0.4 mg total) under the tongue every 5 (five) minutes as needed for chest pain. 30 tablet 0 prn at prn  . ondansetron (ZOFRAN) 4 MG tablet Take 4 mg by mouth every 8 (eight) hours as needed for nausea or vomiting.   prn at prn  . sevelamer carbonate (RENVELA) 800 MG tablet Take 800 mg by mouth 3 (three) times daily with meals.   10/16/2016 at pm    Current medications: Current Facility-Administered Medications  Medication Dose Route Frequency Provider Last Rate Last Dose  . atorvastatin (LIPITOR) tablet 40 mg  40 mg Oral q1800 Vaughan Basta, MD      . cefTRIAXone (ROCEPHIN) 1 g in dextrose 5 % 50 mL IVPB  1 g Intravenous Q24H Vaughan Basta, MD      . cholecalciferol (VITAMIN D) tablet 400 Units  400 Units Oral Daily Vaughan Basta, MD   400 Units at 10/18/16 1039  . docusate sodium (COLACE) capsule 100 mg  100 mg Oral BID PRN Vaughan Basta, MD      . famotidine (PEPCID) tablet 20 mg  20 mg Oral BID Vaughan Basta, MD   20 mg at 10/18/16 1039  . insulin aspart (novoLOG) injection 0-9 Units  0-9 Units Subcutaneous TID WC Vaughan Basta, MD      . insulin glargine (LANTUS) injection 14 Units  14 Units Subcutaneous q1800 Vaughan Basta, MD   14 Units at 10/17/16 2234   . isosorbide mononitrate (IMDUR) 24 hr tablet 30 mg  30 mg Oral Daily Vaughan Basta, MD   30 mg at 10/18/16 1039  . multivitamin with minerals tablet 2 tablet  2 tablet Oral Daily Vaughan Basta, MD   2 tablet at 10/18/16 1039  . nitroGLYCERIN (NITROSTAT) SL tablet 0.4 mg  0.4 mg Sublingual Q5 min PRN Vaughan Basta, MD      . ondansetron [facility]) tablet 4 mg  4 mg Oral Q8H PRN Vaughan Basta, MD      . sevelamer carbonate (RENVELA) tablet 800 mg  800 mg Oral TID WC Vaughan Basta, MD   800 mg at 10/18/16 1039      Allergies: Allergies  Allergen Reactions  . Chlorthalidone Anaphylaxis, Itching and Rash  . Fentanyl Rash  . Midazolam Rash  . Ace Inhibitors Other (See Comments)    Reaction:  Hyperkalemia, agitation   . Angiotensin Receptor Blockers Other (See Comments)    Reaction:  Hyperkalemia, agitation   . Norvasc [Amlodipine] Itching and Rash      Past Medical History: Past Medical History:  Diagnosis Date  . Anemia   . Anorexia   . Bacteremia due to Pseudomonas   . CHF (congestive heart failure) ([place])   . Complication of anesthesia    WITH LAST STENT 10/17. PASED OUT AND HAD TO BE AWAKENED  . Coronary artery disease involving left main coronary artery 01/2015   [facility]: 70% LM, p-mLAD 50-60% (Resting FFR 0.75), mRCA 80-90%, ~40 Ost OM & D1  . Dysrhythmia   . ESRD (end stage renal disease) on dialysis [facility])    ESRD secondary to acute kidney failure s/p CABG  DIALYSIS M/W/F  . Essential hypertension   . GERD (gastroesophageal reflux disease)   . Heart murmur   . Hypercholesterolemia   . Myocardial infarction ([place])   . 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 Marland Kitchen Haithcock: CABG x 3, LIMA to LAD, SVG to RCA, SVG to OM3, EVH  . Type II diabetes mellitus with complication (HCC)    CAD     Past Surgical History: Past Surgical History:  Procedure Laterality Date  . ABDOMINAL HYSTERECTOMY    . AV FISTULA PLACEMENT Left 05/30/2016    Procedure: ARTERIOVENOUS graft;  Surgeon: Algernon Huxley, MD;  Location: [facility] ORS;  Service: Vascular;  Laterality: Left;  . CARDIAC CATHETERIZATION  01/2015   [facility]: Ost LM 70%, p-m LAD 50-60% (Rest FFR + @ 0.75), mRCA 80-90%, ostD1 40%, pOM1 40%  . CATARACT EXTRACTION W/PHACO  Left 01/18/2016   Procedure: CATARACT EXTRACTION PHACO AND INTRAOCULAR LENS PLACEMENT (IOC);  Surgeon: Eulogio Bear, MD;  Location: [facility] ORS;  Service: Ophthalmology;  Laterality: Left;  [place] 1.05 AP% 15.5 CDE 10.16 Fluid Pack Lot # [id number]  . CATARACT EXTRACTION W/PHACO Right 08/01/2016   Procedure: CATARACT EXTRACTION PHACO AND INTRAOCULAR LENS PLACEMENT (IOC);  Surgeon: Eulogio Bear, MD;  Location: [facility] ORS;  Service: Ophthalmology;  Laterality: Right;  [place] 01:00.6 AP% 11.4 CDE 6.93  LOT # [id number]  . COLONOSCOPY    . CORONARY ANGIOPLASTY     SENTS 02/12/16  . CORONARY ARTERY BYPASS GRAFT  03/28/15    [facility]: Dr. Waldemar Dickens: LIMA to LAD, SVG to RCA, SVG to OM3, EVH  . EYE SURGERY    . INSERTION EXPRESS TUBE SHUNT Right 08/01/2016   Procedure: INSERTION EXPRESS TUBE SHUNT;  Surgeon: Eulogio Bear, MD;  Location: [facility] ORS;  Service: Ophthalmology;  Laterality: Right;  . INSERTION OF AHMED VALVE Left 08/15/2016   Procedure: INSERTION OF AHMED VALVE;  Surgeon: Eulogio Bear, MD;  Location: [facility] ORS;  Service: Ophthalmology;  Laterality: Left;  . INSERTION OF DIALYSIS CATHETER    . TRANSTHORACIC ECHOCARDIOGRAM  January 2017    EF 60-65%. GR 2 DD. Mild degenerative mitral valve disease but no prolapse or regurgitation. Mild left atrial dilation. Mild to moderate LVH. Pericardial effusion gone  . UPPER EXTREMITY ANGIOGRAPHY Left 06/27/2016   Procedure: Upper Extremity Angiography;  Surgeon: Algernon Huxley, MD;  Location: [place] CV LAB;  Service: Cardiovascular;  Laterality: Left;  . UPPER EXTREMITY ANGIOGRAPHY Left 08/22/2016   Procedure: Upper Extremity Angiography;  Surgeon: Algernon Huxley, MD;  Location: [place] CV LAB;  Service: Cardiovascular;  Laterality: Left;     Family History: Family History  Problem Relation Age of Onset  . Diabetes Mellitus II Mother      Social History: Social History   Social History  . Marital status: Single    Spouse name: N/A  . Number of children: N/A  . Years of education: N/A   Occupational History  . Not on file.   Social History Main Topics  . Smoking status: Never Smoker  . Smokeless tobacco: Never Used  . Alcohol use No  . Drug use: No  . Sexual activity: No   Other Topics Concern  . Not on file   Social History Narrative  . No narrative on file     Review of Systems: Review of Systems  Constitutional: Positive for chills, fever and malaise/fatigue.  HENT: Negative for ear pain, hearing loss and tinnitus.   Eyes: Negative for blurred vision and double vision.  Respiratory: Negative for cough, hemoptysis and sputum production.   Cardiovascular: Negative for chest pain, palpitations and orthopnea.  Gastrointestinal: Positive for nausea and vomiting.  Genitourinary: Positive for hematuria.  Musculoskeletal: Negative for back pain and myalgias.  Skin: Negative for itching.  Neurological: Negative for dizziness and seizures.  Endo/Heme/Allergies: Negative for polydipsia. Does not bruise/bleed easily.  Psychiatric/Behavioral: Negative for depression. The patient is not nervous/anxious.      Vital Signs: Blood pressure (!) 188/61, pulse 72, temperature 98.2 F (36.8 C), temperature source Oral, resp. rate 14, height 5' 4"  (1.626 m), weight 58.7 kg (129 lb 8 oz), SpO2 99 %.  Weight trends: Filed Weights   10/17/16 1141 10/18/16 0500  Weight: 57.6 kg (127 lb) 58.7 kg (129 lb 8 oz)    Physical Exam: General: NAD, laying in bed  Head: Normocephalic, atraumatic.  Eyes: Anicteric, EOMI  Nose: Mucous membranes moist, not inflammed, nonerythematous.  Throat: Oropharynx nonerythematous, no exudate appreciated.   Neck:  Supple, trachea midline.  Lungs:  Normal respiratory effort. Clear to auscultation BL without crackles or wheezes.  Heart: RRR. S1 and S2 normal without gallop, murmur, or rubs.  Abdomen:  BS normoactive. Soft, Nondistended, non-tender.  No masses or organomegaly.  Extremities: No pretibial edema.  Neurologic: A&O X3, Motor strength is 5/5 in the all 4 extremities  Skin: No visible rashes, scars.    Lab results: Basic Metabolic Panel:  Recent Labs Lab 10/17/16 1009 10/18/16 0321  NA 137 137  K 4.3 3.8  CL 97* 100*  CO2 29 29  GLUCOSE 166* 81  BUN 24* 32*  CREATININE 4.34* 4.75*  CALCIUM 9.9 9.2    Liver Function Tests:  Recent Labs Lab 10/17/16 1009  AST 35  ALT 56*  ALKPHOS 149*  BILITOT 0.6  PROT 8.2*  ALBUMIN 4.0    Recent Labs Lab 10/17/16 1009  LIPASE 37   No results for input(s): AMMONIA in the last 168 hours.  CBC:  Recent Labs Lab 10/17/16 1009 10/18/16 0321  WBC 9.4 7.1  NEUTROABS 7.9*  --   HGB 13.3 11.0*  HCT 41.3 34.1*  MCV 93.3 91.4  PLT 174 168    Cardiac Enzymes: No results for input(s): CKTOTAL, CKMB, CKMBINDEX, TROPONINI in the last 168 hours.  BNP: Invalid input(s): POCBNP  CBG:  Recent Labs Lab 10/17/16 2157 10/18/16 0745 10/18/16 1141  GLUCAP 204* 47 114*    Microbiology: Results for orders placed or performed during the hospital encounter of 10/17/16  Surgical pcr screen     Status: None   Collection Time: 10/17/16 10:09 AM  Result Value Ref Range Status   MRSA, PCR NEGATIVE NEGATIVE Final   Staphylococcus aureus NEGATIVE NEGATIVE Final    Comment:        The Xpert SA Assay (FDA approved for NASAL specimens in patients over 12 years of age), is one component of a comprehensive surveillance program.  Test performance has been validated by [facility] for patients greater than or equal to 39 year old. It is not intended to diagnose infection nor to guide or monitor treatment.     Coagulation  Studies:  Recent Labs  10/17/16 1009  LABPROT 13.5  INR 1.03    Urinalysis:  Recent Labs  10/17/16 1643  COLORURINE RED*  LABSPEC 1.014  PHURINE TEST NOT REPORTED DUE TO COLOR INTERFERENCE OF URINE PIGMENT  GLUCOSEU TEST NOT REPORTED DUE TO COLOR INTERFERENCE OF URINE PIGMENT*  HGBUR TEST NOT REPORTED DUE TO COLOR INTERFERENCE OF URINE PIGMENT*  BILIRUBINUR TEST NOT REPORTED DUE TO COLOR INTERFERENCE OF URINE PIGMENT*  KETONESUR TEST NOT REPORTED DUE TO COLOR INTERFERENCE OF URINE PIGMENT*  PROTEINUR TEST NOT REPORTED DUE TO COLOR INTERFERENCE OF URINE PIGMENT*  NITRITE TEST NOT REPORTED DUE TO COLOR INTERFERENCE OF URINE PIGMENT*  LEUKOCYTESUR TEST NOT REPORTED DUE TO COLOR INTERFERENCE OF URINE PIGMENT*      Imaging: Ct Renal Stone Study  Result Date: 10/17/2016 CLINICAL DATA:  Left lower quadrant abdominal pain and nausea. EXAM: CT ABDOMEN AND PELVIS WITHOUT CONTRAST TECHNIQUE: Multidetector CT imaging of the abdomen and pelvis was performed following the standard protocol without IV contrast. COMPARISON:  10/03/2015. FINDINGS: Lower chest: Enlarged heart with mild progression. No significant change in bibasilar pleural-parenchymal scarring. Hepatobiliary: Small, poorly defined gallstones in the dependent portion of the gallbladder, the  largest measuring 3 mm. No gallbladder wall thickening or pericholecystic fluid. Normal appearing liver. Pancreas: Unremarkable. No pancreatic ductal dilatation or surrounding inflammatory changes. Spleen: Vascular calcifications and possible calcified granulomata. Normal size and shape. Adrenals/Urinary Tract: Normal appearing adrenal glands. Bilateral renal artery branch calcifications, making it difficult to evaluate for calculi. There are probably some small calculi. Air in the upper pole renal collecting system on the right. There is also a 6 mm probable calculus within the collecting system on image number 21 of series 2, not previously present.  Air in the urinary bladder. Mild diffuse bladder wall thickening with progression. No bladder or ureteral calculi seen. No hydronephrosis. Stomach/Bowel: Multiple colonic diverticula without evidence of diverticulitis. Small hiatal hernia. Unremarkable small bowel and appendix. Vascular/Lymphatic: Extensive atheromatous arterial calcifications without aneurysm. No enlarged lymph nodes. Reproductive: Surgically absent uterus.  No adnexal masses. Other: Midline surgical scar with and infraumbilical midline hernia containing mildly edematous fat. Tiny umbilical hernia containing fat. Musculoskeletal: Lumbar and lower thoracic spine degenerative changes, most pronounced involving the facet joints at multiple levels. IMPRESSION: 1. Small to moderate-sized midline incisional scar below the umbilicus containing mildly edematous fat. 2. Colonic diverticulosis without evidence of diverticulitis. 3. Air in the urinary bladder and upper pole right renal collecting system, most likely due to recent bladder catheterization. 4. Interval 6 mm upper pole right renal calculus. 5. Mild to moderate diffuse bladder wall thickening with mild progression. This may be due to cystitis. 6. Extensive dense atheromatous arterial calcifications. 7. Small hiatal hernia. Electronically Signed   By: Claudie Revering M.D.   On: 10/17/2016 16:26      Assessment & Plan: Pt is a 66 y.o. female with a PMHx of ESRD on HD MWF followed by [facility] nephrology, anemia of chronic kidney disease, secondary hyperparathyroidism, congestive heart failure, coronary artery disease status post three-vessel CABG in 2016, hypertension, GERD, hyperlipidemia, myocardial infarction, steal syndrome of the left upper extremity AV graft, who was admitted to [facility] on 10/17/2016 for evaluation of gross hematuria.   1. ESRD on HD MWF. Patient due for hemodialysis today. We have prepared orders. Ultrafiltration target 1.5 kg. It appears that she has steal syndrome in her left  upper external knee. This is to be addressed by vascular surgery sometime next week.  2. Anemia chronic kidney disease. Hold off on adding Epogen for now.  3. Secondary hyperparathyroidism. Check intact PTH and phosphorus with dialysis. Continue Renvela 800 mg by mouth 3 times a day with meals for now.  4. UTI. Patient had gross hematuria and CT scan suggested cystitis. Continue with ceftriaxone 1 g IV every 24 hours for now.   5. Thanks for consultation.

## 2016-10-19 LAB — CBC
HCT: 36.4 % (ref 35.0–47.0)
Hemoglobin: 12 g/dL (ref 12.0–16.0)
MCH: 30 pg (ref 26.0–34.0)
MCHC: 33 g/dL (ref 32.0–36.0)
MCV: 91.1 fL (ref 80.0–100.0)
Platelets: 160 10*3/uL (ref 150–440)
RBC: 4 MIL/uL (ref 3.80–5.20)
RDW: 19.7 % — ABNORMAL HIGH (ref 11.5–14.5)
WBC: 6.6 10*3/uL (ref 3.6–11.0)

## 2016-10-19 LAB — BASIC METABOLIC PANEL
Anion gap: 6 (ref 5–15)
BUN: 23 mg/dL — ABNORMAL HIGH (ref 6–20)
CO2: 33 mmol/L — ABNORMAL HIGH (ref 22–32)
Calcium: 8.7 mg/dL — ABNORMAL LOW (ref 8.9–10.3)
Chloride: 98 mmol/L — ABNORMAL LOW (ref 101–111)
Creatinine, Ser: 4.62 mg/dL — ABNORMAL HIGH (ref 0.44–1.00)
GFR calc Af Amer: 11 mL/min — ABNORMAL LOW (ref >60)
GFR calc non Af Amer: 9 mL/min — ABNORMAL LOW (ref >60)
Glucose, Bld: 164 mg/dL — ABNORMAL HIGH (ref 65–99)
Potassium: 3.4 mmol/L — ABNORMAL LOW (ref 3.5–5.1)
Sodium: 137 mmol/L (ref 135–145)

## 2016-10-19 LAB — URINE CULTURE: Special Requests: NORMAL

## 2016-10-19 LAB — HIV ANTIBODY (ROUTINE TESTING W REFLEX): HIV Screen 4th Generation wRfx: NONREACTIVE

## 2016-10-19 LAB — GLUCOSE, CAPILLARY: Glucose-Capillary: 161 mg/dL — ABNORMAL HIGH (ref 65–99)

## 2016-10-19 LAB — PARATHYROID HORMONE, INTACT (NO CA): PTH: 103 pg/mL — ABNORMAL HIGH (ref 15–65)

## 2016-10-19 MED ORDER — CEFUROXIME AXETIL 500 MG PO TABS: 500.0000 mg | ORAL_TABLET | Freq: Two times a day (BID) | ORAL | 0 refills | Status: DC

## 2016-10-19 MED ORDER — DOCUSATE SODIUM 100 MG PO CAPS
200.0000 mg | ORAL_CAPSULE | Freq: Once | ORAL | Status: AC
  Administered 2016-10-19: 200 mg via ORAL
  Filled 2016-10-19: qty 2

## 2016-10-19 MED ORDER — CEFUROXIME AXETIL 500 MG PO TABS: 500.0000 mg | ORAL_TABLET | Freq: Two times a day (BID) | ORAL | Status: DC

## 2016-10-19 MED ORDER — CLOPIDOGREL BISULFATE 75 MG PO TABS: 75.0000 mg | ORAL_TABLET | Freq: Every day | ORAL | 0 refills | Status: DC

## 2016-10-19 MED ORDER — LACTULOSE 10 GM/15ML PO SOLN
30.0000 g | Freq: Once | ORAL | Status: AC
  Administered 2016-10-19: 30 g via ORAL
  Filled 2016-10-19: qty 60

## 2016-10-19 MED ORDER — ASPIRIN EC 81 MG PO TBEC: 81.0000 mg | DELAYED_RELEASE_TABLET | Freq: Every day | ORAL | Status: AC

## 2016-10-19 NOTE — Discharge Instructions (Signed)
[place] at [place]:  Renal diet  DISCHARGE CONDITION:  Stable  ACTIVITY:  Activity as tolerated  OXYGEN:  Home Oxygen: No.   Oxygen Delivery: room air  DISCHARGE LOCATION:  home    ADDITIONAL DISCHARGE INSTRUCTION: dont take asa, and plavix for next 5 days then resume if no bleeding   If you experience worsening of your admission symptoms, develop shortness of breath, life threatening emergency, suicidal or homicidal thoughts you must seek medical attention immediately by calling 911 or calling your MD immediately  if symptoms less severe.  You Must read complete instructions/literature along with all the possible adverse reactions/side effects for all the Medicines you take and that have been prescribed to you. Take any new Medicines after you have completely understood and accpet all the possible adverse reactions/side effects.   Please note  You were cared for by a hospitalist during your hospital stay. If you have any questions about your discharge medications or the care you received while you were in the hospital after you are discharged, you can call the unit and asked to speak with the hospitalist on call if the hospitalist that took care of you is not available. Once you are discharged, your primary care physician will handle any further medical issues. Please note that NO REFILLS for any discharge medications will be authorized once you are discharged, as it is imperative that you return to your primary care physician (or establish a relationship with a primary care physician if you do not have one) for your aftercare needs so that they can reassess your need for medications and monitor your lab values.

## 2016-10-19 NOTE — Discharge Summary (Signed)
[facility], 66 y.o., DOB 06/13/1950, MRN [id number]. Admission date: 10/17/2016 Discharge Date 10/19/2016 Primary MD System, Pcp Not In Admitting Physician Stephanie Basta, MD  Admission Diagnosis  Cystitis [N30.90] Non-intractable vomiting with nausea, unspecified vomiting type [R11.2] Hypertension, unspecified type [I10] Hematuria, unspecified type [R31.9]  Discharge Diagnosis   Principal Problem:  Hematuria  UTI  End-stage renal disease on hemodialysis Coronary artery disease Hypotension Diabetes type 2        [name]  is a 66 y.o. female with a known history of Anemia, CHF, coronary artery disease, end-stage renal disease on hemodialysis, essential hypertension, myocardial infarction, status post CABG, diabetes- today morning started having some nausea and noted her urine is having blood. She was noted to have urinary tract infection. Because of the hematuria she was seen in consultation by urology she did have a CT per renal study which did show bladder wall thickening. Urology recommended outpatient follow-up with cystoscopy recommended treatment for urinary tract infection. Patient's urine culture showed mixed flora. She is feeling much better hematuria is clearing she will need oral antibiotics and outpatient urology follow-up.Marland Kitchen            Consults  urology, nephroloyg  Significant Tests:  See full reports for all details     Ct Renal Stone Study  Result Date: 10/17/2016 CLINICAL DATA:  Left lower quadrant abdominal pain and nausea. EXAM: CT ABDOMEN AND PELVIS WITHOUT CONTRAST TECHNIQUE: Multidetector CT imaging of the abdomen and pelvis was performed following the standard protocol without IV contrast. COMPARISON:  10/03/2015. FINDINGS: Lower chest: Enlarged heart with mild progression. No significant change in bibasilar pleural-parenchymal scarring. Hepatobiliary: Small, poorly defined  gallstones in the dependent portion of the gallbladder, the largest measuring 3 mm. No gallbladder wall thickening or pericholecystic fluid. Normal appearing liver. Pancreas: Unremarkable. No pancreatic ductal dilatation or surrounding inflammatory changes. Spleen: Vascular calcifications and possible calcified granulomata. Normal size and shape. Adrenals/Urinary Tract: Normal appearing adrenal glands. Bilateral renal artery branch calcifications, making it difficult to evaluate for calculi. There are probably some small calculi. Air in the upper pole renal collecting system on the right. There is also a 6 mm probable calculus within the collecting system on image number 21 of series 2, not previously present. Air in the urinary bladder. Mild diffuse bladder wall thickening with progression. No bladder or ureteral calculi seen. No hydronephrosis. Stomach/Bowel: Multiple colonic diverticula without evidence of diverticulitis. Small hiatal hernia. Unremarkable small bowel and appendix. Vascular/Lymphatic: Extensive atheromatous arterial calcifications without aneurysm. No enlarged lymph nodes. Reproductive: Surgically absent uterus.  No adnexal masses. Other: Midline surgical scar with and infraumbilical midline hernia containing mildly edematous fat. Tiny umbilical hernia containing fat. Musculoskeletal: Lumbar and lower thoracic spine degenerative changes, most pronounced involving the facet joints at multiple levels. IMPRESSION: 1. Small to moderate-sized midline incisional scar below the umbilicus containing mildly edematous fat. 2. Colonic diverticulosis without evidence of diverticulitis. 3. Air in the urinary bladder and upper pole right renal collecting system, most likely due to recent bladder catheterization. 4. Interval 6 mm upper pole right renal calculus. 5. Mild to moderate diffuse bladder wall thickening with mild progression. This may be due to cystitis. 6. Extensive dense atheromatous arterial  calcifications. 7. Small hiatal hernia. Electronically Signed   By: Claudie Revering M.D.   On: 10/17/2016 16:26       Today   Subjective:   Stephanie Ellis  Patient feels much better still  has some blood in the urine but overall much improved Blood pressure (!) 170/52, pulse 68, temperature 98.2 F (36.8 C), temperature source Oral, resp. rate 17, height 5' 4"  (1.626 m), weight 129 lb 10.1 oz (58.8 kg), SpO2 100 %.  .  Intake/Output Summary (Last 24 hours) at 10/19/16 1444 Last data filed at 10/19/16 0930  Gross per 24 hour  Intake               50 ml  Output             1950 ml  Net            -1900 ml    Exam VITAL SIGNS: Blood pressure (!) 170/52, pulse 68, temperature 98.2 F (36.8 C), temperature source Oral, resp. rate 17, height 5' 4"  (1.626 m), weight 129 lb 10.1 oz (58.8 kg), SpO2 100 %.  GENERAL:  66 y.o.-year-old patient lying in the bed with no acute distress.  EYES: Pupils equal, round, reactive to light and accommodation. No scleral icterus. Extraocular muscles intact.  HEENT: Head atraumatic, normocephalic. Oropharynx and nasopharynx clear.  NECK:  Supple, no jugular venous distention. No thyroid enlargement, no tenderness.  LUNGS: Normal breath sounds bilaterally, no wheezing, rales,rhonchi or crepitation. No use of accessory muscles of respiration.  CARDIOVASCULAR: S1, S2 normal. No murmurs, rubs, or gallops.  ABDOMEN: Soft, nontender, nondistended. Bowel sounds present. No organomegaly or mass.  EXTREMITIES: No pedal edema, cyanosis, or clubbing.  NEUROLOGIC: Cranial nerves II through XII are intact. Muscle strength 5/5 in all extremities. Sensation intact. Gait not checked.  PSYCHIATRIC: The patient is alert and oriented x 3.  SKIN: No obvious rash, lesion, or ulcer.   Data Review     CBC w Diff: Lab Results  Component Value Date   WBC 6.6 10/19/2016   HGB 12.0 10/19/2016   HCT 36.4 10/19/2016   PLT 160 10/19/2016   LYMPHOPCT 9 10/17/2016   MONOPCT 5  10/17/2016   EOSPCT 1 10/17/2016   BASOPCT 0 10/17/2016   CMP: Lab Results  Component Value Date   NA 137 10/19/2016   K 3.4 (L) 10/19/2016   CL 98 (L) 10/19/2016   CO2 33 (H) 10/19/2016   BUN 23 (H) 10/19/2016   CREATININE 4.62 (H) 10/19/2016   PROT 8.2 (H) 10/17/2016   ALBUMIN 4.0 10/17/2016   BILITOT 0.6 10/17/2016   ALKPHOS 149 (H) 10/17/2016   AST 35 10/17/2016   ALT 56 (H) 10/17/2016  .  Micro Results Recent Results (from the past 240 hour(s))  Surgical pcr screen     Status: None   Collection Time: 10/17/16 10:09 AM  Result Value Ref Range Status   MRSA, PCR NEGATIVE NEGATIVE Final   Staphylococcus aureus NEGATIVE NEGATIVE Final    Comment:        The Xpert SA Assay (FDA approved for NASAL specimens in patients over 26 years of age), is one component of a comprehensive surveillance program.  Test performance has been validated by [facility] for patients greater than or equal to 45 year old. It is not intended to diagnose infection nor to guide or monitor treatment.   Urine culture     Status: Abnormal   Collection Time: 10/17/16  4:34 PM  Result Value Ref Range Status   Specimen Description URINE, RANDOM  Final   Special Requests Normal  Final   Culture MULTIPLE SPECIES PRESENT, SUGGEST RECOLLECTION (A)  Final   Report Status 10/19/2016 FINAL  Final  Code Status History    Date Active Date Inactive Code Status Order ID Comments User Context   10/17/2016  9:43 PM 10/19/2016  2:40 PM Full Code [id number]  Stephanie Basta, MD Inpatient   09/04/2016  5:08 PM 09/05/2016  3:00 PM Full Code [id number]  Epifanio Lesches, MD ED   08/22/2016  9:38 AM 08/22/2016  6:33 PM Full Code [id number]  Algernon Huxley, MD Inpatient   06/27/2016 11:21 AM 06/27/2016  6:46 PM Full Code [id number]  Algernon Huxley, MD Inpatient   10/03/2015 10:35 PM 10/04/2015  5:24 PM Full Code [id number]  Harrie Foreman, MD Inpatient          Follow-up Information    Nickie Retort,  MD. Call in 1 month(s).   Specialty:  Urology Why:  Please call Dr. Carlynn Purl office on Monday to schedule an appointment in one month to look inside bladder for blood in urine follow up  [phone] Contact information: [place] [address] [phone]        pcp Follow up in 1 week(s).   Why:  Please call Monday and schedule an appointment for next week.          Discharge Medications   Allergies as of 10/19/2016      Reactions   Chlorthalidone Anaphylaxis, Itching, Rash   Fentanyl Rash   Midazolam Rash   Ace Inhibitors Other (See Comments)   Reaction:  Hyperkalemia, agitation    Angiotensin Receptor Blockers Other (See Comments)   Reaction:  Hyperkalemia, agitation    Norvasc [amlodipine] Itching, Rash      Medication List    TAKE these medications   aspirin EC 81 MG tablet Take 1 tablet (81 mg total) by mouth daily.   atorvastatin 40 MG tablet Commonly known as:  LIPITOR Take 40 mg by mouth daily at 6 PM.   cefUROXime 500 MG tablet Commonly known as:  CEFTIN Take 1 tablet (500 mg total) by mouth 2 (two) times daily with a meal.   cholecalciferol 400 units Tabs tablet Commonly known as:  VITAMIN D Take 400 Units by mouth daily.   clopidogrel 75 MG tablet Commonly known as:  PLAVIX Take 1 tablet (75 mg total) by mouth daily. dont take for next five day What changed:  additional instructions   DUREZOL 0.05 % Emul Generic drug:  Difluprednate Apply 1 drop to eye 4 (four) times daily.   famotidine 20 MG tablet Commonly known as:  PEPCID Take 1 tablet (20 mg total) by mouth 2 (two) times daily. What changed:  when to take this  reasons to take this   insulin glargine 100 unit/mL Sopn Commonly known as:  LANTUS Inject 14 Units into the skin every evening.   isosorbide mononitrate 30 MG 24 hr tablet Commonly known as:  IMDUR Take 1 tablet (30 mg total) by mouth daily.   metoprolol tartrate 25 MG tablet Commonly  known as:  LOPRESSOR 1/2 tablet po twice a day What changed:  how much to take  how to take this  when to take this  additional instructions   multivitamin with minerals Tabs tablet Take 2 tablets by mouth daily. Gummy vitamins   nitroGLYCERIN 0.4 MG SL tablet Commonly known as:  NITROSTAT Place 1 tablet (0.4 mg total) under the tongue every 5 (five) minutes as needed for chest pain.   ondansetron 4 MG tablet Commonly known as:  ZOFRAN Take 4 mg by mouth every 8 (  eight) hours as needed for nausea or vomiting.   sevelamer carbonate 800 MG tablet Commonly known as:  RENVELA Take 800 mg by mouth 3 (three) times daily with meals.          Total Time in preparing paper work, data evaluation and todays exam - 35 minutes  Dustin Flock M.D on 10/19/2016 at 2:44 PM  [facility]   Office  [phone]

## 2016-10-19 NOTE — Progress Notes (Signed)
[facility]  ROUNDING NOTE   Subjective:  Patient seen at bedside. She completed hemodialysis yesterday.  she still is having some mild hematuria. Patient is maintained on ceftriaxone.   Objective:  Vital signs in last 24 hours:  Temp:  [97.7 F (36.5 C)-98.9 F (37.2 C)] 98.2 F (36.8 C) (06/30 0441) Pulse Rate:  [65-74] 68 (06/30 0934) Resp:  [12-20] 17 (06/30 0441) BP: (140-211)/(41-93) 170/52 (06/30 0934) SpO2:  [96 %-100 %] 100 % (06/30 0934) Weight:  [58.8 kg (129 lb 10.1 oz)] 58.8 kg (129 lb 10.1 oz) (06/29 1525)  Weight change: 1.193 kg (2 lb 10.1 oz) Filed Weights   10/17/16 1141 10/18/16 0500 10/18/16 1525  Weight: 57.6 kg (127 lb) 58.7 kg (129 lb 8 oz) 58.8 kg (129 lb 10.1 oz)    Intake/Output: I/O last 3 completed shifts: In: 69 [P.O.:840; IV Piggyback:50] Out: 7829 [Urine:850; Other:1500]   Intake/Output this shift:  No intake/output data recorded.  Physical Exam: General: No acute distress  Head: Normocephalic, atraumatic. Moist oral mucosal membranes  Eyes: Anicteric  Neck: Supple, trachea midline  Lungs:  Clear to auscultation, normal effort  Heart: S1S2 no rubs  Abdomen:  Soft, nontender, bowel sounds present  Extremities: No peripheral edema.  Neurologic: Awake, alert, following commands  Skin: No lesions  Access: LUE AVG    Basic Metabolic Panel:  Recent Labs Lab 10/17/16 1009 10/18/16 0321 10/18/16 1600 10/19/16 0309  NA 137 137  --  137  K 4.3 3.8  --  3.4*  CL 97* 100*  --  98*  CO2 29 29  --  33*  GLUCOSE 166* 81  --  164*  BUN 24* 32*  --  23*  CREATININE 4.34* 4.75*  --  4.62*  CALCIUM 9.9 9.2  --  8.7*  PHOS  --   --  2.0*  --     Liver Function Tests:  Recent Labs Lab 10/17/16 1009  AST 35  ALT 56*  ALKPHOS 149*  BILITOT 0.6  PROT 8.2*  ALBUMIN 4.0    Recent Labs Lab 10/17/16 1009  LIPASE 37   No results for input(s): AMMONIA in the last 168 hours.  CBC:  Recent Labs Lab  10/17/16 1009 10/18/16 0321 10/19/16 0309  WBC 9.4 7.1 6.6  NEUTROABS 7.9*  --   --   HGB 13.3 11.0* 12.0  HCT 41.3 34.1* 36.4  MCV 93.3 91.4 91.1  PLT 174 168 160    Cardiac Enzymes: No results for input(s): CKTOTAL, CKMB, CKMBINDEX, TROPONINI in the last 168 hours.  BNP: Invalid input(s): POCBNP  CBG:  Recent Labs Lab 10/17/16 2157 10/18/16 0745 10/18/16 1141 10/18/16 2121 10/19/16 0751  GLUCAP 204* 69 114* 137* 161*    Microbiology: Results for orders placed or performed during the hospital encounter of 10/17/16  Urine culture     Status: Abnormal   Collection Time: 10/17/16  4:34 PM  Result Value Ref Range Status   Specimen Description URINE, RANDOM  Final   Special Requests Normal  Final   Culture MULTIPLE SPECIES PRESENT, SUGGEST RECOLLECTION (A)  Final   Report Status 10/19/2016 FINAL  Final    Coagulation Studies:  Recent Labs  10/17/16 1009  LABPROT 13.5  INR 1.03    Urinalysis:  Recent Labs  10/17/16 1643  COLORURINE RED*  LABSPEC 1.014  PHURINE TEST NOT REPORTED DUE TO COLOR INTERFERENCE OF URINE PIGMENT  GLUCOSEU TEST NOT REPORTED DUE TO COLOR INTERFERENCE OF URINE PIGMENT*  HGBUR  TEST NOT REPORTED DUE TO COLOR INTERFERENCE OF URINE PIGMENT*  BILIRUBINUR TEST NOT REPORTED DUE TO COLOR INTERFERENCE OF URINE PIGMENT*  KETONESUR TEST NOT REPORTED DUE TO COLOR INTERFERENCE OF URINE PIGMENT*  PROTEINUR TEST NOT REPORTED DUE TO COLOR INTERFERENCE OF URINE PIGMENT*  NITRITE TEST NOT REPORTED DUE TO COLOR INTERFERENCE OF URINE PIGMENT*  LEUKOCYTESUR TEST NOT REPORTED DUE TO COLOR INTERFERENCE OF URINE PIGMENT*      Imaging: Ct Renal Stone Study  Result Date: 10/17/2016 CLINICAL DATA:  Left lower quadrant abdominal pain and nausea. EXAM: CT ABDOMEN AND PELVIS WITHOUT CONTRAST TECHNIQUE: Multidetector CT imaging of the abdomen and pelvis was performed following the standard protocol without IV contrast. COMPARISON:  10/03/2015. FINDINGS: Lower  chest: Enlarged heart with mild progression. No significant change in bibasilar pleural-parenchymal scarring. Hepatobiliary: Small, poorly defined gallstones in the dependent portion of the gallbladder, the largest measuring 3 mm. No gallbladder wall thickening or pericholecystic fluid. Normal appearing liver. Pancreas: Unremarkable. No pancreatic ductal dilatation or surrounding inflammatory changes. Spleen: Vascular calcifications and possible calcified granulomata. Normal size and shape. Adrenals/Urinary Tract: Normal appearing adrenal glands. Bilateral renal artery branch calcifications, making it difficult to evaluate for calculi. There are probably some small calculi. Air in the upper pole renal collecting system on the right. There is also a 6 mm probable calculus within the collecting system on image number 21 of series 2, not previously present. Air in the urinary bladder. Mild diffuse bladder wall thickening with progression. No bladder or ureteral calculi seen. No hydronephrosis. Stomach/Bowel: Multiple colonic diverticula without evidence of diverticulitis. Small hiatal hernia. Unremarkable small bowel and appendix. Vascular/Lymphatic: Extensive atheromatous arterial calcifications without aneurysm. No enlarged lymph nodes. Reproductive: Surgically absent uterus.  No adnexal masses. Other: Midline surgical scar with and infraumbilical midline hernia containing mildly edematous fat. Tiny umbilical hernia containing fat. Musculoskeletal: Lumbar and lower thoracic spine degenerative changes, most pronounced involving the facet joints at multiple levels. IMPRESSION: 1. Small to moderate-sized midline incisional scar below the umbilicus containing mildly edematous fat. 2. Colonic diverticulosis without evidence of diverticulitis. 3. Air in the urinary bladder and upper pole right renal collecting system, most likely due to recent bladder catheterization. 4. Interval 6 mm upper pole right renal calculus. 5.  Mild to moderate diffuse bladder wall thickening with mild progression. This may be due to cystitis. 6. Extensive dense atheromatous arterial calcifications. 7. Small hiatal hernia. Electronically Signed   By: Claudie Revering M.D.   On: 10/17/2016 16:26     Medications:    . atorvastatin  40 mg Oral q1800  . cefUROXime  500 mg Oral BID WC  . cholecalciferol  400 Units Oral Daily  . docusate sodium  200 mg Oral Once  . famotidine  20 mg Oral Daily  . insulin aspart  0-9 Units Subcutaneous TID WC  . insulin glargine  14 Units Subcutaneous q1800  . isosorbide mononitrate  30 mg Oral Daily  . lactulose  30 g Oral Once  . multivitamin with minerals  2 tablet Oral Daily  . sevelamer carbonate  800 mg Oral TID WC   docusate sodium, hydrALAZINE, nitroGLYCERIN, ondansetron  Assessment/ Plan:  66 y.o. 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   1. ESRD on HD MWF. Patient completed hemodialysis yesterday. No acute indication for dialysis today.  2. Anemia chronic kidney disease. Hemoglobin 12.0 moment. No indication for Procrit at this time.  3. Secondary hyperparathyroidism. Phosphorus 2.1 and at target. Continue current doses of Renvela.  4. UTI.  Patient still having some mild hematuria. She has been started on antibiotic therapy with ceftriaxone. If hematuria persists she may need an outpatient urology evaluation as well.   LOS: 2 Stephanie Ellis 6/30/201811:06 AM

## 2016-10-23 MED ORDER — CEFAZOLIN SODIUM-DEXTROSE 2-4 GM/100ML-% IV SOLN: 2.0000 g | INTRAVENOUS | Status: DC

## 2016-10-24 ENCOUNTER — Emergency Department: Payer: Medicare Other

## 2016-10-24 ENCOUNTER — Observation Stay: Payer: Medicare Other | Admitting: Anesthesiology

## 2016-10-24 ENCOUNTER — Encounter: Payer: Self-pay | Admitting: Emergency Medicine

## 2016-10-24 ENCOUNTER — Other Ambulatory Visit: Payer: Self-pay

## 2016-10-24 ENCOUNTER — Ambulatory Visit: Admission: RE | Admit: 2016-10-24 | Payer: Medicare Other | Source: Ambulatory Visit | Admitting: Vascular Surgery

## 2016-10-24 ENCOUNTER — Encounter
Admission: EM | Disposition: A | Discharge status: Home / Self Care | Payer: Self-pay | Source: Home / Self Care | Attending: Emergency Medicine

## 2016-10-24 ENCOUNTER — Observation Stay
Admission: EM | Admit: 2016-10-24 | Discharge: 2016-10-25 | Disposition: A | Discharge status: Home / Self Care | Payer: Medicare Other | Attending: Internal Medicine | Admitting: Internal Medicine

## 2016-10-24 DIAGNOSIS — R112 Nausea with vomiting, unspecified: Secondary | ICD-10-CM | POA: Diagnosis present

## 2016-10-24 DIAGNOSIS — R0789 Other chest pain: Principal | ICD-10-CM | POA: Insufficient documentation

## 2016-10-24 DIAGNOSIS — Z7982 Long term (current) use of aspirin: Secondary | ICD-10-CM | POA: Insufficient documentation

## 2016-10-24 DIAGNOSIS — E1122 Type 2 diabetes mellitus with diabetic chronic kidney disease: Secondary | ICD-10-CM | POA: Insufficient documentation

## 2016-10-24 DIAGNOSIS — N2581 Secondary hyperparathyroidism of renal origin: Secondary | ICD-10-CM | POA: Insufficient documentation

## 2016-10-24 DIAGNOSIS — N390 Urinary tract infection, site not specified: Secondary | ICD-10-CM | POA: Insufficient documentation

## 2016-10-24 DIAGNOSIS — Z794 Long term (current) use of insulin: Secondary | ICD-10-CM | POA: Diagnosis not present

## 2016-10-24 DIAGNOSIS — Z951 Presence of aortocoronary bypass graft: Secondary | ICD-10-CM | POA: Insufficient documentation

## 2016-10-24 DIAGNOSIS — D631 Anemia in chronic kidney disease: Secondary | ICD-10-CM | POA: Diagnosis not present

## 2016-10-24 DIAGNOSIS — I509 Heart failure, unspecified: Secondary | ICD-10-CM | POA: Diagnosis not present

## 2016-10-24 DIAGNOSIS — Z992 Dependence on renal dialysis: Secondary | ICD-10-CM | POA: Insufficient documentation

## 2016-10-24 DIAGNOSIS — I2510 Atherosclerotic heart disease of native coronary artery without angina pectoris: Secondary | ICD-10-CM | POA: Insufficient documentation

## 2016-10-24 DIAGNOSIS — R079 Chest pain, unspecified: Secondary | ICD-10-CM

## 2016-10-24 DIAGNOSIS — E7800 Pure hypercholesterolemia, unspecified: Secondary | ICD-10-CM | POA: Insufficient documentation

## 2016-10-24 DIAGNOSIS — K219 Gastro-esophageal reflux disease without esophagitis: Secondary | ICD-10-CM | POA: Insufficient documentation

## 2016-10-24 DIAGNOSIS — R1110 Vomiting, unspecified: Secondary | ICD-10-CM | POA: Diagnosis present

## 2016-10-24 DIAGNOSIS — N186 End stage renal disease: Secondary | ICD-10-CM | POA: Insufficient documentation

## 2016-10-24 DIAGNOSIS — I132 Hypertensive heart and chronic kidney disease with heart failure and with stage 5 chronic kidney disease, or end stage renal disease: Secondary | ICD-10-CM | POA: Diagnosis not present

## 2016-10-24 DIAGNOSIS — I252 Old myocardial infarction: Secondary | ICD-10-CM | POA: Insufficient documentation

## 2016-10-24 HISTORY — PX: ESOPHAGOGASTRODUODENOSCOPY (EGD) WITH PROPOFOL: SHX5813

## 2016-10-24 LAB — GLUCOSE, CAPILLARY
Glucose-Capillary: 206 mg/dL — ABNORMAL HIGH (ref 65–99)
Glucose-Capillary: 186 mg/dL — ABNORMAL HIGH (ref 65–99)
Glucose-Capillary: 182 mg/dL — ABNORMAL HIGH (ref 65–99)

## 2016-10-24 LAB — CBC WITH DIFFERENTIAL/PLATELET
Basophils Absolute: 0 10*3/uL (ref 0–0.1)
Basophils Relative: 0 %
Eosinophils Absolute: 0.1 10*3/uL (ref 0–0.7)
Eosinophils Relative: 1 %
HCT: 39.1 % (ref 35.0–47.0)
Hemoglobin: 12.7 g/dL (ref 12.0–16.0)
Lymphocytes Relative: 9 %
Lymphs Abs: 0.8 10*3/uL — ABNORMAL LOW (ref 1.0–3.6)
MCH: 29.5 pg (ref 26.0–34.0)
MCHC: 32.4 g/dL (ref 32.0–36.0)
MCV: 90.9 fL (ref 80.0–100.0)
Monocytes Absolute: 0.4 10*3/uL (ref 0.2–0.9)
Monocytes Relative: 5 %
Neutro Abs: 6.8 10*3/uL — ABNORMAL HIGH (ref 1.4–6.5)
Neutrophils Relative %: 85 %
Platelets: 178 10*3/uL (ref 150–440)
RBC: 4.31 MIL/uL (ref 3.80–5.20)
RDW: 19.4 % — ABNORMAL HIGH (ref 11.5–14.5)
WBC: 8.1 10*3/uL (ref 3.6–11.0)

## 2016-10-24 LAB — TROPONIN I: Troponin I: 0.06 ng/mL (ref <0.03)

## 2016-10-24 LAB — COMPREHENSIVE METABOLIC PANEL WITH GFR
ALT: 22 U/L (ref 14–54)
AST: 27 U/L (ref 15–41)
Albumin: 4.1 g/dL (ref 3.5–5.0)
Alkaline Phosphatase: 94 U/L (ref 38–126)
Anion gap: 13 (ref 5–15)
BUN: 48 mg/dL — ABNORMAL HIGH (ref 6–20)
CO2: 26 mmol/L (ref 22–32)
Calcium: 9.5 mg/dL (ref 8.9–10.3)
Chloride: 100 mmol/L — ABNORMAL LOW (ref 101–111)
Creatinine, Ser: 6.05 mg/dL — ABNORMAL HIGH (ref 0.44–1.00)
GFR calc Af Amer: 8 mL/min — ABNORMAL LOW
GFR calc non Af Amer: 7 mL/min — ABNORMAL LOW
Glucose, Bld: 197 mg/dL — ABNORMAL HIGH (ref 65–99)
Potassium: 4 mmol/L (ref 3.5–5.1)
Sodium: 139 mmol/L (ref 135–145)
Total Bilirubin: 0.9 mg/dL (ref 0.3–1.2)
Total Protein: 7.9 g/dL (ref 6.5–8.1)

## 2016-10-24 LAB — LIPASE, BLOOD: Lipase: 25 U/L (ref 11–51)

## 2016-10-24 SURGERY — ESOPHAGOGASTRODUODENOSCOPY (EGD) WITH PROPOFOL
Anesthesia: General

## 2016-10-24 SURGERY — LIGATION ARTERIOVENOUS GORTEX GRAFT
Anesthesia: General | Laterality: Left

## 2016-10-24 MED ORDER — ACETAMINOPHEN 650 MG RE SUPP: 650.0000 mg | Freq: Four times a day (QID) | RECTAL | Status: DC | PRN

## 2016-10-24 MED ORDER — SODIUM CHLORIDE 0.9 % IV SOLN
INTRAVENOUS | Status: DC
  Administered 2016-10-24: 17:00:00 via INTRAVENOUS

## 2016-10-24 MED ORDER — INSULIN ASPART 100 UNIT/ML ~~LOC~~ SOLN
0.0000 [IU] | Freq: Three times a day (TID) | SUBCUTANEOUS | Status: DC
Start: 2016-10-24 — End: 2016-10-25
  Administered 2016-10-25: 2 [IU] via SUBCUTANEOUS
  Filled 2016-10-24 (×2): qty 1

## 2016-10-24 MED ORDER — TRAMADOL HCL 50 MG PO TABS: 50.0000 mg | ORAL_TABLET | Freq: Four times a day (QID) | ORAL | Status: DC | PRN

## 2016-10-24 MED ORDER — INSULIN GLARGINE 100 UNIT/ML ~~LOC~~ SOLN
10.0000 [IU] | Freq: Every evening | SUBCUTANEOUS | Status: DC
  Administered 2016-10-24 – 2016-10-25 (×2): 10 [IU] via SUBCUTANEOUS
  Filled 2016-10-24 (×2): qty 0.1

## 2016-10-24 MED ORDER — ATORVASTATIN CALCIUM 20 MG PO TABS
40.0000 mg | ORAL_TABLET | Freq: Every day | ORAL | Status: DC
  Administered 2016-10-24 – 2016-10-25 (×2): 40 mg via ORAL
  Filled 2016-10-24 (×2): qty 2

## 2016-10-24 MED ORDER — METOCLOPRAMIDE HCL 5 MG/ML IJ SOLN
5.0000 mg | Freq: Four times a day (QID) | INTRAMUSCULAR | Status: AC
  Administered 2016-10-24 – 2016-10-25 (×4): 5 mg via INTRAVENOUS
  Filled 2016-10-24 (×4): qty 2

## 2016-10-24 MED ORDER — HYDRALAZINE HCL 20 MG/ML IJ SOLN
INTRAMUSCULAR | Status: AC
  Filled 2016-10-24: qty 1

## 2016-10-24 MED ORDER — ONDANSETRON HCL 4 MG PO TABS: 4.0000 mg | ORAL_TABLET | Freq: Four times a day (QID) | ORAL | Status: DC | PRN

## 2016-10-24 MED ORDER — ASPIRIN 81 MG PO CHEW
324.0000 mg | CHEWABLE_TABLET | Freq: Once | ORAL | Status: AC
  Administered 2016-10-24: 324 mg via ORAL
  Filled 2016-10-24: qty 4

## 2016-10-24 MED ORDER — ISOSORBIDE MONONITRATE ER 30 MG PO TB24
30.0000 mg | ORAL_TABLET | Freq: Every day | ORAL | Status: DC
  Administered 2016-10-25: 30 mg via ORAL
  Filled 2016-10-24: qty 1

## 2016-10-24 MED ORDER — NITROGLYCERIN 0.4 MG SL SUBL
0.4000 mg | SUBLINGUAL_TABLET | SUBLINGUAL | Status: DC | PRN
  Administered 2016-10-24 (×2): 0.4 mg via SUBLINGUAL
  Filled 2016-10-24 (×2): qty 1

## 2016-10-24 MED ORDER — FAMOTIDINE 20 MG PO TABS
40.0000 mg | ORAL_TABLET | Freq: Once | ORAL | Status: AC
  Administered 2016-10-24: 40 mg via ORAL
  Filled 2016-10-24: qty 2

## 2016-10-24 MED ORDER — ONDANSETRON HCL 4 MG/2ML IJ SOLN
INTRAMUSCULAR | Status: AC
  Filled 2016-10-24: qty 2

## 2016-10-24 MED ORDER — POLYETHYLENE GLYCOL 3350 17 G PO PACK: 17.0000 g | PACK | Freq: Every day | ORAL | Status: DC | PRN

## 2016-10-24 MED ORDER — ALBUTEROL SULFATE (2.5 MG/3ML) 0.083% IN NEBU: 2.5000 mg | INHALATION_SOLUTION | RESPIRATORY_TRACT | Status: DC | PRN

## 2016-10-24 MED ORDER — ONDANSETRON 4 MG PO TBDP
ORAL_TABLET | ORAL | Status: AC
  Filled 2016-10-24: qty 1

## 2016-10-24 MED ORDER — SODIUM CHLORIDE 0.9 % IV BOLUS (SEPSIS)
500.0000 mL | Freq: Once | INTRAVENOUS | Status: AC
  Administered 2016-10-24: 500 mL via INTRAVENOUS

## 2016-10-24 MED ORDER — ONDANSETRON 4 MG PO TBDP
4.0000 mg | ORAL_TABLET | Freq: Once | ORAL | Status: AC
  Administered 2016-10-24: 4 mg via ORAL

## 2016-10-24 MED ORDER — SEVELAMER CARBONATE 800 MG PO TABS
800.0000 mg | ORAL_TABLET | Freq: Three times a day (TID) | ORAL | Status: DC
  Administered 2016-10-25: 800 mg via ORAL
  Filled 2016-10-24: qty 1

## 2016-10-24 MED ORDER — DIFLUPREDNATE 0.05 % OP EMUL
1.0000 [drp] | Freq: Four times a day (QID) | OPHTHALMIC | Status: DC
  Administered 2016-10-24 – 2016-10-25 (×3): 1 [drp] via OPHTHALMIC
  Filled 2016-10-24: qty 5

## 2016-10-24 MED ORDER — HEPARIN SODIUM (PORCINE) 5000 UNIT/ML IJ SOLN
5000.0000 [IU] | Freq: Three times a day (TID) | INTRAMUSCULAR | Status: DC
  Administered 2016-10-24 – 2016-10-25 (×2): 5000 [IU] via SUBCUTANEOUS
  Filled 2016-10-24 (×2): qty 1

## 2016-10-24 MED ORDER — GI COCKTAIL ~~LOC~~
30.0000 mL | Freq: Once | ORAL | Status: AC
Start: 2016-10-24 — End: 2016-10-24
  Administered 2016-10-24: 30 mL via ORAL
  Filled 2016-10-24: qty 30

## 2016-10-24 MED ORDER — PROPOFOL 10 MG/ML IV BOLUS
INTRAVENOUS | Status: DC | PRN
  Administered 2016-10-24: 40 mg via INTRAVENOUS

## 2016-10-24 MED ORDER — PANTOPRAZOLE SODIUM 40 MG IV SOLR
40.0000 mg | INTRAVENOUS | Status: DC
  Administered 2016-10-24: 40 mg via INTRAVENOUS
  Filled 2016-10-24 (×2): qty 40

## 2016-10-24 MED ORDER — LIDOCAINE 2% (20 MG/ML) 5 ML SYRINGE
INTRAMUSCULAR | Status: DC | PRN
  Administered 2016-10-24: 100 mg via INTRAVENOUS

## 2016-10-24 MED ORDER — ASPIRIN EC 81 MG PO TBEC
81.0000 mg | DELAYED_RELEASE_TABLET | Freq: Every day | ORAL | Status: DC
  Administered 2016-10-25: 81 mg via ORAL
  Filled 2016-10-24: qty 1

## 2016-10-24 MED ORDER — ACETAMINOPHEN 325 MG PO TABS
650.0000 mg | ORAL_TABLET | Freq: Four times a day (QID) | ORAL | Status: DC | PRN
  Administered 2016-10-25: 650 mg via ORAL

## 2016-10-24 MED ORDER — METOPROLOL TARTRATE 25 MG PO TABS
12.5000 mg | ORAL_TABLET | Freq: Two times a day (BID) | ORAL | Status: DC
  Administered 2016-10-24 – 2016-10-25 (×3): 12.5 mg via ORAL
  Filled 2016-10-24 (×3): qty 1

## 2016-10-24 MED ORDER — ONDANSETRON HCL 4 MG/2ML IJ SOLN
4.0000 mg | Freq: Once | INTRAMUSCULAR | Status: AC
  Administered 2016-10-24: 4 mg via INTRAVENOUS

## 2016-10-24 MED ORDER — HYDRALAZINE HCL 20 MG/ML IJ SOLN
10.0000 mg | Freq: Four times a day (QID) | INTRAMUSCULAR | Status: DC | PRN
  Administered 2016-10-24: 10 mg via INTRAVENOUS

## 2016-10-24 MED ORDER — GLYCOPYRROLATE 0.2 MG/ML IJ SOLN
INTRAMUSCULAR | Status: DC | PRN
  Administered 2016-10-24: 0.2 mg via INTRAVENOUS

## 2016-10-24 MED ORDER — HYDRALAZINE HCL 20 MG/ML IJ SOLN
10.0000 mg | INTRAMUSCULAR | Status: DC | PRN
  Administered 2016-10-24: 10 mg via INTRAVENOUS
  Filled 2016-10-24: qty 1

## 2016-10-24 MED ORDER — PROPOFOL 500 MG/50ML IV EMUL
INTRAVENOUS | Status: DC | PRN
Start: 2016-10-24 — End: 2016-10-24
  Administered 2016-10-24: 120 ug/kg/min via INTRAVENOUS

## 2016-10-24 MED ORDER — ONDANSETRON HCL 4 MG/2ML IJ SOLN
4.0000 mg | Freq: Four times a day (QID) | INTRAMUSCULAR | Status: DC | PRN
  Administered 2016-10-25: 4 mg via INTRAVENOUS
  Filled 2016-10-24: qty 2

## 2016-10-24 NOTE — Progress Notes (Signed)
[facility]  ROUNDING NOTE   Subjective:   [name] admitted to [facility] on 10/24/2016 for Chest pain, unspecified type [R07.9] Intractable vomiting with nausea, unspecified vomiting type [R11.2]  Missed dialysis yesterday.   Scheduled for Endoscopy today  Objective:  Vital signs in last 24 hours:  Temp:  [98.1 F (36.7 C)-98.2 F (36.8 C)] 98.2 F (36.8 C) (07/05 1239) Pulse Rate:  [70-84] 70 (07/05 1443) Resp:  [10-23] 12 (07/05 1239) BP: (161-214)/(63-94) 185/73 (07/05 1443) SpO2:  [82 %-100 %] 100 % (07/05 1239) Weight:  [57.6 kg (127 lb)-57.7 kg (127 lb 4.8 oz)] 57.7 kg (127 lb 4.8 oz) (07/05 1239)  Weight change:  Filed Weights   10/24/16 0752 10/24/16 1239  Weight: 57.6 kg (127 lb) 57.7 kg (127 lb 4.8 oz)    Intake/Output: No intake/output data recorded.   Intake/Output this shift:  Total I/O In: 500 [IV Piggyback:500] Out: -   Physical Exam: General: No acute distress  Head: Normocephalic, atraumatic. Moist oral mucosal membranes  Eyes: Anicteric  Neck: Supple, trachea midline  Lungs:  Clear to auscultation, normal effort  Heart: S1S2 no rubs  Abdomen:  Soft, nontender, bowel sounds present  Extremities: No peripheral edema.  Neurologic: Awake, alert, following commands  Skin: No lesions  Access: Left forearm AVG    Basic Metabolic Panel:  Recent Labs Lab 10/18/16 0321 10/18/16 1600 10/19/16 0309 10/24/16 0846  NA 137  --  137 139  K 3.8  --  3.4* 4.0  CL 100*  --  98* 100*  CO2 29  --  33* 26  GLUCOSE 81  --  164* 197*  BUN 32*  --  23* 48*  CREATININE 4.75*  --  4.62* 6.05*  CALCIUM 9.2  --  8.7* 9.5  PHOS  --  2.0*  --   --     Liver Function Tests:  Recent Labs Lab 10/24/16 0846  AST 27  ALT 22  ALKPHOS 94  BILITOT 0.9  PROT 7.9  ALBUMIN 4.1    Recent Labs Lab 10/24/16 0846  LIPASE 25   No results for input(s): AMMONIA in the last 168 hours.  CBC:  Recent Labs Lab 10/18/16 0321 10/19/16 0309  10/24/16 0846  WBC 7.1 6.6 8.1  NEUTROABS  --   --  6.8*  HGB 11.0* 12.0 12.7  HCT 34.1* 36.4 39.1  MCV 91.4 91.1 90.9  PLT 168 160 178    Cardiac Enzymes:  Recent Labs Lab 10/24/16 0846  TROPONINI 0.06*    BNP: Invalid input(s): POCBNP  CBG:  Recent Labs Lab 10/18/16 0745 10/18/16 1141 10/18/16 2121 10/19/16 0751 10/24/16 1345  GLUCAP 69 114* 137* 161* 206*    Microbiology: Results for orders placed or performed during the hospital encounter of 10/17/16  Urine culture     Status: Abnormal   Collection Time: 10/17/16  4:34 PM  Result Value Ref Range Status   Specimen Description URINE, RANDOM  Final   Special Requests Normal  Final   Culture MULTIPLE SPECIES PRESENT, SUGGEST RECOLLECTION (A)  Final   Report Status 10/19/2016 FINAL  Final    Coagulation Studies: No results for input(s): LABPROT, INR in the last 72 hours.  Urinalysis: No results for input(s): COLORURINE, LABSPEC, PHURINE, GLUCOSEU, HGBUR, BILIRUBINUR, KETONESUR, PROTEINUR, UROBILINOGEN, NITRITE, LEUKOCYTESUR in the last 72 hours.  Invalid input(s): APPERANCEUR    Imaging: Dg Chest Port 1 View  Result Date: 10/24/2016 CLINICAL DATA:  65 year old female with chest pain today.  Nausea and vomiting for 1 week. EXAM: PORTABLE CHEST 1 VIEW COMPARISON:  09/04/2016.  CT Abdomen and Pelvis 10/17/2016 FINDINGS: Portable AP upright view at 0807 hours. Stable right chest dual lumen dialysis type catheter. Stable cardiac size and mediastinal contours. Prior CABG. Stable blunting of both costophrenic angles which appears related to fibrothorax on the comparison CT. Stable mild cardiomegaly. Calcified aortic atherosclerosis. No pneumothorax, pulmonary edema or acute pulmonary opacity. IMPRESSION: 1.  No acute cardiopulmonary abnormality. 2. Mild cardiomegaly, Calcified aortic atherosclerosis, and chronic appearing costophrenic angle fibrothorax. Electronically Signed   By: Genevie Ann M.D.   On: 10/24/2016 08:25      Medications:    . [START ON 10/25/2016] aspirin EC  81 mg Oral Daily  . atorvastatin  40 mg Oral q1800  . Difluprednate  1 drop Ophthalmic QID  . heparin  5,000 Units Subcutaneous Q8H  . insulin aspart  0-9 Units Subcutaneous TID WC  . insulin glargine  10 Units Subcutaneous QPM  . isosorbide mononitrate  30 mg Oral Daily  . metoCLOPramide (REGLAN) injection  5 mg Intravenous Q6H  . metoprolol tartrate  12.5 mg Oral BID  . pantoprazole (PROTONIX) IV  40 mg Intravenous Q24H  . sevelamer carbonate  800 mg Oral TID WC   acetaminophen **OR** acetaminophen, albuterol, hydrALAZINE, nitroGLYCERIN, ondansetron **OR** ondansetron (ZOFRAN) IV, polyethylene glycol, traMADol  Assessment/ Plan:  [name] is a 66 y.o. black female with end stage renal disease on hemodialysis, hypertension, congestive heart failure, coronary artery disease status post CABG, GERD admitted to [facility] on 10/24/2016 for Chest pain, unspecified type [R07.9] Intractable vomiting with nausea, unspecified vomiting type [R11.2]   MWF [facility]  1. ESRD on HD MWF: missed dialysis yesterday. No acute indication for dialysis today. Plan for dialysis tomorrow. Resume MWF  2. Anemia chronic kidney disease. Hemoglobin 12.7  - mircera as outpatient  3. Secondary hyperparathyroidism. - sevelamer  4. Urinary Tract Infection: on PO ceftin.   5. Hypertension: not well controlled.  - IV hydralazine q4 PRN - home regimen of metoprolol   LOS: 0 Stephanie Ellis 7/5/20183:24 PM

## 2016-10-24 NOTE — Consult Note (Signed)
Stephanie Lame, MD [facility]  [address] [place], [place] [address] Phone: [phone] Fax : [phone]  Consultation  Referring Provider:     Dr. Darvin Neighbours Primary Care Physician:  System, Pcp Not In Primary Gastroenterologist:  Althia Forts         Reason for Consultation:     Nausea and vomiting  Date of Admission:  10/24/2016 Date of Consultation:  10/24/2016         HPI:   Stephanie Ellis is a 66 y.o. female Who was admitted with a history of known coronary artery disease and status post CABG.  The patient states that she has had off and on nausea since having her coronary artery bypass.  The patient also is on dialysis for end-stage renal disease.  She came in to the hospital because she had 2 weeks of nausea and vomiting.  The patient was recently in the hospital for cystitis and sent home on antibiotics.  The patient also had some nausea and vomiting while she was in the hospital but resolved.  The patient had received a GI cocktail with nausea medication and continue to report that she had symptoms. The patient denies any diarrhea, black stools or bloody stools.  She also denies vomiting any blood or coffee grounds. The patient's troponin was elevated but only slightly at 0.06.  I'm now being asked to see the patient for intractable nausea and vomiting and epigastric pain. The hiatal hernia seen on her CT scan at the end of last month was not seen likely due to this being a sliding Hiatal hernia.  Past Medical History:  Diagnosis Date  . Anemia   . Anorexia   . Bacteremia due to Pseudomonas   . CHF (congestive heart failure) ([place])   . Complication of anesthesia    WITH LAST STENT 10/17. PASED OUT AND HAD TO BE AWAKENED  . Coronary artery disease involving left main coronary artery 01/2015   [facility]: 70% LM, p-mLAD 50-60% (Resting FFR 0.75), mRCA 80-90%, ~40 Ost OM & D1  . Dysrhythmia   . ESRD (end stage renal disease) on dialysis [facility])    ESRD secondary to acute kidney failure s/p  CABG  DIALYSIS M/W/F  . Essential hypertension   . GERD (gastroesophageal reflux disease)   . Heart murmur   . Hypercholesterolemia   . Myocardial infarction ([place])   . 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 Marland Kitchen Haithcock: CABG x 3, LIMA to LAD, SVG to RCA, SVG to OM3, EVH  . Type II diabetes mellitus with complication (HCC)    CAD    Past Surgical History:  Procedure Laterality Date  . ABDOMINAL HYSTERECTOMY    . AV FISTULA PLACEMENT Left 05/30/2016   Procedure: ARTERIOVENOUS graft;  Surgeon: Algernon Huxley, MD;  Location: [facility] ORS;  Service: Vascular;  Laterality: Left;  . CARDIAC CATHETERIZATION  01/2015   [facility]: Ost LM 70%, p-m LAD 50-60% (Rest FFR + @ 0.75), mRCA 80-90%, ostD1 40%, pOM1 40%  . CATARACT EXTRACTION W/PHACO Left 01/18/2016   Procedure: CATARACT EXTRACTION PHACO AND INTRAOCULAR LENS PLACEMENT (IOC);  Surgeon: Eulogio Bear, MD;  Location: [facility] ORS;  Service: Ophthalmology;  Laterality: Left;  [place] 1.05 AP% 15.5 CDE 10.16 Fluid Pack Lot # [id number]  . CATARACT EXTRACTION W/PHACO Right 08/01/2016   Procedure: CATARACT EXTRACTION PHACO AND INTRAOCULAR LENS PLACEMENT (IOC);  Surgeon: Eulogio Bear, MD;  Location: [facility] ORS;  Service: Ophthalmology;  Laterality: Right;  [place] 01:00.6  AP% 11.4 CDE 6.93  LOT # [id number]  . COLONOSCOPY    . CORONARY ANGIOPLASTY     SENTS 02/12/16  . CORONARY ARTERY BYPASS GRAFT  03/28/15    [facility]: Dr. Waldemar Dickens: LIMA to LAD, SVG to RCA, SVG to OM3, EVH  . EYE SURGERY    . INSERTION EXPRESS TUBE SHUNT Right 08/01/2016   Procedure: INSERTION EXPRESS TUBE SHUNT;  Surgeon: Eulogio Bear, MD;  Location: [facility] ORS;  Service: Ophthalmology;  Laterality: Right;  . INSERTION OF AHMED VALVE Left 08/15/2016   Procedure: INSERTION OF AHMED VALVE;  Surgeon: Eulogio Bear, MD;  Location: [facility] OR[facility];  Service: Ophthalmology;  Laterality: Left;  . INSERTION OF DIALYSIS CATHETER    . TRANSTHORACIC ECHOCARDIOGRAM  January 2017    EF 60-65%. GR 2 DD. Mild  degenerative mitral valve disease but no prolapse or regurgitation. Mild left atrial dilation. Mild to moderate LVH. Pericardial effusion gone  . UPPER EXTREMITY ANGIOGRAPHY Left 06/27/2016   Procedure: Upper Extremity Angiography;  Surgeon: Algernon Huxley, MD;  Location: [facility];  Service: Cardiovascular;  Laterality: Left;  . UPPER EXTREMITY ANGIOGRAPHY Left 08/22/2016   Procedure: Upper Extremity Angiography;  Surgeon: Algernon Huxley, MD;  Location: [facility];  Service: Cardiovascular;  Laterality: Left;    Prior to Admission medications   Medication Sig Start Date End Date Taking? Authorizing Provider  aspirin EC 81 MG tablet Take 1 tablet (81 mg total) by mouth daily. 10/19/16  Yes Dustin Flock, MD  atorvastatin (LIPITOR) 40 MG tablet Take 40 mg by mouth daily at 6 PM.    Yes [provider]  cefUROXime (CEFTIN) 500 MG tablet Take 1 tablet (500 mg total) by mouth 2 (two) times daily with a meal. 10/19/16  Yes Dustin Flock, MD  cholecalciferol (VITAMIN D) 400 units TABS tablet Take 400 Units by mouth daily.    Yes [provider]  Difluprednate (DUREZOL) 0.05 % EMUL Apply 1 drop to eye 4 (four) times daily.   Yes [provider]  famotidine (PEPCID) 20 MG tablet Take 1 tablet (20 mg total) by mouth 2 (two) times daily. Patient taking differently: Take 20 mg by mouth daily as needed for heartburn.  09/05/16  Yes Wieting, Richard, MD  insulin glargine (LANTUS) 100 unit/mL SOPN Inject 14 Units into the skin every evening.    Yes [provider]  isosorbide mononitrate (IMDUR) 30 MG 24 hr tablet Take 1 tablet (30 mg total) by mouth daily. 09/05/16  Yes Wieting, Richard, MD  metoprolol tartrate (LOPRESSOR) 25 MG tablet 1/2 tablet po twice a day Patient taking differently: Take 12.5 mg by mouth 2 (two) times daily. 1/2 tablet po twice a day 09/05/16  Yes Wieting, Richard, MD  Multiple Vitamin (MULTIVITAMIN WITH MINERALS) TABS tablet Take 2 tablets  by mouth daily. Gummy vitamins   Yes [provider]  nitroGLYCERIN (NITROSTAT) 0.4 MG SL tablet Place 1 tablet (0.4 mg total) under the tongue every 5 (five) minutes as needed for chest pain. 09/05/16  Yes Wieting, Richard, MD  ondansetron (ZOFRAN) 4 MG tablet Take 4 mg by mouth every 8 (eight) hours as needed for nausea or vomiting.   Yes [provider]  sevelamer carbonate (RENVELA) 800 MG tablet Take 800 mg by mouth 3 (three) times daily with meals.   Yes [provider]  clopidogrel (PLAVIX) 75 MG tablet Take 1 tablet (75 mg total) by mouth daily. dont take for next five day Patient  not taking: Reported on 10/24/2016 10/19/16   Dustin Flock, MD    Family History  Problem Relation Age of Onset  . Diabetes Mellitus II Mother      Social History  Substance Use Topics  . Smoking status: Never Smoker  . Smokeless tobacco: Never Used  . Alcohol use No    Allergies as of 10/24/2016 - Review Complete 10/24/2016  Allergen Reaction Noted  . Chlorthalidone Anaphylaxis, Itching, and Rash 10/03/2015  . Fentanyl Rash 02/12/2016  . Midazolam Rash 02/12/2016  . Ace inhibitors Other (See Comments) 10/03/2015  . Angiotensin receptor blockers Other (See Comments) 10/03/2015  . Norvasc [amlodipine] Itching and Rash 10/03/2015  . Phenergan [promethazine hcl] Anxiety 10/24/2016    Review of Systems:    All systems reviewed and negative except where noted in HPI.   Physical Exam:  Vital signs in last 24 hours: Temp:  [95.3 F (35.2 C)-98.2 F (36.8 C)] 97.9 F (36.6 C) (07/05 1752) Pulse Rate:  [70-84] 75 (07/05 1752) Resp:  [10-23] 17 (07/05 1752) BP: (132-214)/(44-94) 136/44 (07/05 1752) SpO2:  [82 %-100 %] 95 % (07/05 1752) Weight:  [127 lb (57.6 kg)-127 lb 4.8 oz (57.7 kg)] 127 lb (57.6 kg) (07/05 1623)   General:   Pleasant, cooperative in NAD Head:  Normocephalic and atraumatic. Eyes:   No icterus.   Conjunctiva pink. PERRLA. Ears:  Normal auditory  acuity. Neck:  Supple; no masses or thyroidomegaly Lungs: Respirations even and unlabored. Lungs clear to auscultation bilaterally.   No wheezes, crackles, or rhonchi.  Heart:  Regular rate and rhythm;  Without murmur, clicks, rubs or gallops Abdomen:  Soft, nondistended, nontender. Normal bowel sounds. No appreciable masses or hepatomegaly.  No rebound or guarding.  Rectal:  Not performed. Msk:  Symmetrical without gross deformities.    Extremities:  Without edema, cyanosis or clubbing. Neurologic:  Alert and oriented x3;  grossly normal neurologically. Skin:  Intact without significant lesions or rashes. Cervical Nodes:  No significant cervical adenopathy. Psych:  Alert and cooperative. Normal affect.  LAB RESULTS:  Recent Labs  10/24/16 0846  WBC 8.1  HGB 12.7  HCT 39.1  PLT 178   BMET  Recent Labs  10/24/16 0846  NA 139  K 4.0  CL 100*  CO2 26  GLUCOSE 197*  BUN 48*  CREATININE 6.05*  CALCIUM 9.5   LFT  Recent Labs  10/24/16 0846  PROT 7.9  ALBUMIN 4.1  AST 27  ALT 22  ALKPHOS 94  BILITOT 0.9   PT/INR No results for input(s): LABPROT, INR in the last 72 hours.  STUDIES: Dg Chest Port 1 View  Result Date: 10/24/2016 CLINICAL DATA:  66 year old female with chest pain today. Nausea and vomiting for 1 week. EXAM: PORTABLE CHEST 1 VIEW COMPARISON:  09/04/2016.  CT Abdomen and Pelvis 10/17/2016 FINDINGS: Portable AP upright view at 0807 hours. Stable right chest dual lumen dialysis type catheter. Stable cardiac size and mediastinal contours. Prior CABG. Stable blunting of both costophrenic angles which appears related to fibrothorax on the comparison CT. Stable mild cardiomegaly. Calcified aortic atherosclerosis. No pneumothorax, pulmonary edema or acute pulmonary opacity. IMPRESSION: 1.  No acute cardiopulmonary abnormality. 2. Mild cardiomegaly, Calcified aortic atherosclerosis, and chronic appearing costophrenic angle fibrothorax. Electronically Signed   By: Genevie Ann M.D.   On: 10/24/2016 08:25      Impression / Plan:   Stephanie Ellis is a 45 y.o. y/o female with A few weeks of nausea and vomiting.  The patient  was brought down for an upper endoscopy to look for a GI source for her nausea vomiting.  The upper endoscopy was completely normal without any gastritis esophagitis highly hernia or any other abnormalities.  I recommend looking for another source of the patient's nausea and vomiting such as her kidney disease or metabolic derangements.  Agent may also be suffering from gastroparesis although her symptoms are not consistent with a classical presentation of gastroparesis.  Thank you for involving me in the care of this patient.      LOS: 0 days   Stephanie Lame, MD  10/24/2016, 7:49 PM   Note: This dictation was prepared with Dragon dictation along with smaller phrase technology. Any transcriptional errors that result from this process are unintentional.

## 2016-10-24 NOTE — ED Provider Notes (Signed)
[facility] Emergency Department Provider Note ____________________________________________   I have reviewed the triage vital signs and the triage nursing note.  HISTORY  Chief Complaint Weakness and Emesis   Historian Patient and daughter at bedside  HPI Stephanie Ellis is a 66 y.o. female with a history of end-stage renal disease, on dialysis typically dialyzes on Monday Wednesday and Friday, but missed yesterday due to the fact that she has been vomiting for for 5 days and did not feel up to it. She also has a history of CABG and prior cardiac stenting. She developed some epigastric and central chest discomfort about an hour prior to arrival. She's had problems with vomiting and nausea for about 4 days now. Denies diarrhea. Denies fever. States that she's been unable to keep her medications down. He was concerned that she may be dehydrated at this point.  Chest/epigastric discomfort is moderate in intensity at present. Nothing makes it worse or better. She is continuing to have active wretching.    Past Medical History:  Diagnosis Date  . Anemia   . Anorexia   . Bacteremia due to Pseudomonas   . CHF (congestive heart failure) ([place])   . Complication of anesthesia    WITH LAST STENT 10/17. PASED OUT AND HAD TO BE AWAKENED  . Coronary artery disease involving left main coronary artery 01/2015   [facility]: 70% LM, p-mLAD 50-60% (Resting FFR 0.75), mRCA 80-90%, ~40 Ost OM & D1  . Dysrhythmia   . ESRD (end stage renal disease) on dialysis [facility])    ESRD secondary to acute kidney failure s/p CABG  DIALYSIS M/W/F  . Essential hypertension   . GERD (gastroesophageal reflux disease)   . Heart murmur   . Hypercholesterolemia   . Myocardial infarction ([place])   . 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 Marland Kitchen Haithcock: CABG x 3, LIMA to LAD, SVG to RCA, SVG to OM3, EVH  . Type II diabetes mellitus with complication [facility])    CAD    Patient Active Problem List   Diagnosis Date Noted  . Hematuria 10/17/2016  . Acute lower UTI 10/17/2016  . Anesthesia complication 21/30/8657  . Chest pain with moderate risk for cardiac etiology 09/04/2016  . Steal syndrome dialysis vascular access, initial encounter ([place]) 06/18/2016  . Hypertension 02/02/2016  . Hyperlipidemia 02/02/2016  . Coronary artery disease involving left main coronary artery 10/04/2015  . Abdominal pain of unknown etiology 10/04/2015  . ESRD (end stage renal disease) on dialysis ([place])   . Type II diabetes mellitus with complication ([place])   . NSTEMI (non-ST elevated myocardial infarction) ([place])   . Right sided abdominal pain   . Elevated troponin 10/03/2015  . 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 ABDOMINAL HYSTERECTOMY    . AV FISTULA PLACEMENT Left 05/30/2016   Procedure: ARTERIOVENOUS graft;  Surgeon: Algernon Huxley, MD;  Location: [facility] ORS;  Service: Vascular;  Laterality: Left;  . CARDIAC CATHETERIZATION  01/2015   [facility]: Ost LM 70%, p-m LAD 50-60% (Rest FFR + @ 0.75), mRCA 80-90%, ostD1 40%, pOM1 40%  . CATARACT EXTRACTION W/PHACO Left 01/18/2016   Procedure: CATARACT EXTRACTION PHACO AND INTRAOCULAR LENS PLACEMENT (IOC);  Surgeon: Eulogio Bear, MD;  Location: [facility] ORS;  Service: Ophthalmology;  Laterality: Left;  [place] 1.05 AP% 15.5 CDE 10.16 Fluid Pack Lot # [id number]  . CATARACT EXTRACTION W/PHACO Right 08/01/2016   Procedure: CATARACT EXTRACTION PHACO AND  INTRAOCULAR LENS PLACEMENT (IOC);  Surgeon: Eulogio Bear, MD;  Location: [facility] ORS;  Service: Ophthalmology;  Laterality: Right;  [place] 01:00.6 AP% 11.4 CDE 6.93  LOT # [id number]  . COLONOSCOPY    . CORONARY ANGIOPLASTY     SENTS 02/12/16  . CORONARY ARTERY BYPASS GRAFT  03/28/15    [facility]: Dr. Waldemar Dickens: LIMA to LAD, SVG to RCA, SVG to OM3, EVH  . EYE SURGERY    . INSERTION EXPRESS TUBE SHUNT Right 08/01/2016   Procedure: INSERTION EXPRESS TUBE SHUNT;  Surgeon: Eulogio Bear, MD;   Location: [facility] ORS;  Service: Ophthalmology;  Laterality: Right;  . INSERTION OF AHMED VALVE Left 08/15/2016   Procedure: INSERTION OF AHMED VALVE;  Surgeon: Eulogio Bear, MD;  Location: [facility] ORS;  Service: Ophthalmology;  Laterality: Left;  . INSERTION OF DIALYSIS CATHETER    . TRANSTHORACIC ECHOCARDIOGRAM  January 2017    EF 60-65%. GR 2 DD. Mild degenerative mitral valve disease but no prolapse or regurgitation. Mild left atrial dilation. Mild to moderate LVH. Pericardial effusion gone  . UPPER EXTREMITY ANGIOGRAPHY Left 06/27/2016   Procedure: Upper Extremity Angiography;  Surgeon: Algernon Huxley, MD;  Location: [place] CV LAB;  Service: Cardiovascular;  Laterality: Left;  . UPPER EXTREMITY ANGIOGRAPHY Left 08/22/2016   Procedure: Upper Extremity Angiography;  Surgeon: Algernon Huxley, MD;  Location: [place] CV LAB;  Service: Cardiovascular;  Laterality: Left;    Prior to Admission medications   Medication Sig Start Date End Date Taking? Authorizing Provider  aspirin EC 81 MG tablet Take 1 tablet (81 mg total) by mouth daily. 10/19/16  Yes Dustin Flock, MD  atorvastatin (LIPITOR) 40 MG tablet Take 40 mg by mouth daily at 6 PM.    Yes [provider]  cefUROXime (CEFTIN) 500 MG tablet Take 1 tablet (500 mg total) by mouth 2 (two) times daily with a meal. 10/19/16  Yes Dustin Flock, MD  cholecalciferol (VITAMIN D) 400 units TABS tablet Take 400 Units by mouth daily.    Yes [provider]  Difluprednate (DUREZOL) 0.05 % EMUL Apply 1 drop to eye 4 (four) times daily.   Yes [provider]  famotidine (PEPCID) 20 MG tablet Take 1 tablet (20 mg total) by mouth 2 (two) times daily. Patient taking differently: Take 20 mg by mouth daily as needed for heartburn.  09/05/16  Yes Wieting, Richard, MD  insulin glargine (LANTUS) 100 unit/mL SOPN Inject 14 Units into the skin every evening.    Yes [provider]  isosorbide mononitrate (IMDUR) 30 MG 24 hr  tablet Take 1 tablet (30 mg total) by mouth daily. 09/05/16  Yes Wieting, Richard, MD  metoprolol tartrate (LOPRESSOR) 25 MG tablet 1/2 tablet po twice a day Patient taking differently: Take 12.5 mg by mouth 2 (two) times daily. 1/2 tablet po twice a day 09/05/16  Yes Wieting, Richard, MD  Multiple Vitamin (MULTIVITAMIN WITH MINERALS) TABS tablet Take 2 tablets by mouth daily. Gummy vitamins   Yes [provider]  nitroGLYCERIN (NITROSTAT) 0.4 MG SL tablet Place 1 tablet (0.4 mg total) under the tongue every 5 (five) minutes as needed for chest pain. 09/05/16  Yes Wieting, Richard, MD  ondansetron (ZOFRAN) 4 MG tablet Take 4 mg by mouth every 8 (eight) hours as needed for nausea or vomiting.   Yes [provider]  sevelamer carbonate (RENVELA) 800 MG tablet Take 800 mg by mouth 3 (three) times daily with meals.   Yes [provider]  clopidogrel (PLAVIX) 75 MG tablet Take 1 tablet (75 mg total) by mouth daily. dont take for next five day Patient not taking: Reported on 10/24/2016 10/19/16   Dustin Flock, MD    Allergies  Allergen Reactions  . Chlorthalidone Anaphylaxis, Itching and Rash  . Fentanyl Rash  . Midazolam Rash  . Ace Inhibitors Other (See Comments)    Reaction:  Hyperkalemia, agitation   . Angiotensin Receptor Blockers Other (See Comments)    Reaction:  Hyperkalemia, agitation   . Norvasc [Amlodipine] Itching and Rash  . Phenergan [Promethazine Hcl] Anxiety    "antsy, can't sit still"    Family History  Problem Relation Age of Onset  . Diabetes Mellitus II Mother     Social History Social History  Substance Use Topics  . Smoking status: Never Smoker  . Smokeless tobacco: Never Used  . Alcohol use No    Review of Systems  Constitutional: Negative for fever. Eyes: Negative for visual changes. ENT: Negative for sore throat. Cardiovascular: Positive for chest pain. Respiratory: Negative for shortness of breath. Gastrointestinal: Positive for  epigastric pain, nonbloody emesis. Genitourinary: Negative for dysuria. Musculoskeletal: Negative for back pain. Skin: Negative for rash. Neurological: Negative for headache.  ____________________________________________   PHYSICAL EXAM:  VITAL SIGNS: ED Triage Vitals  Enc Vitals Group     BP 10/24/16 0751 (!) 214/79     Pulse Rate 10/24/16 0751 80     Resp 10/24/16 0751 16     Temp 10/24/16 0751 98.1 F (36.7 C)     Temp Source 10/24/16 0751 Oral     SpO2 10/24/16 0751 100 %     Weight 10/24/16 0752 127 lb (57.6 kg)     Height 10/24/16 0752 5' 4"  (1.626 m)     Head Circumference --      Peak Flow --      Pain Score 10/24/16 0750 7     Pain Loc --      Pain Edu? --      Excl. in [place]? --      Constitutional: Alert and oriented.  Active wretching. HEENT   Head: Normocephalic and atraumatic.      Eyes: Conjunctivae are normal. Pupils equal and round.       Ears:         Nose: No congestion/rhinnorhea.   Mouth/Throat: Mucous membranes are dry.   Neck: No stridor. Cardiovascular/Chest: Normal rate, regular rhythm.  No murmurs, rubs, or gallops. Respiratory: Normal respiratory effort without tachypnea nor retractions. Breath sounds are clear and equal bilaterally. No wheezes/rales/rhonchi. Gastrointestinal: Soft. No distention, no guarding, no rebound. Mild epigastric discomfort to palpation  Genitourinary/rectal:Deferred Musculoskeletal: Nontender with normal range of motion in all extremities. No joint effusions.  No lower extremity tenderness.  No edema. Neurologic:  Normal speech and language. No gross or focal neurologic deficits are appreciated. Skin:  Skin is warm, dry and intact. No rash noted. Psychiatric: Mood and affect are normal. Speech and behavior are normal. Patient exhibits appropriate insight and judgment.   ____________________________________________  LABS (pertinent positives/negatives)  Labs Reviewed  COMPREHENSIVE METABOLIC PANEL -  Abnormal; Notable for the following:       Result Value   Chloride 100 (*)    Glucose, Bld 197 (*)    BUN 48 (*)    Creatinine, Ser 6.05 (*)    GFR calc non Af Amer 7 (*)    GFR calc Af Amer 8 (*)    All other components within  normal limits  TROPONIN I - Abnormal; Notable for the following:    Troponin I 0.06 (*)    All other components within normal limits  CBC WITH DIFFERENTIAL/PLATELET - Abnormal; Notable for the following:    RDW 19.4 (*)    Neutro Abs 6.8 (*)    Lymphs Abs 0.8 (*)    All other components within normal limits    ____________________________________________    EKG I, Lisa Roca, MD, the attending physician have personally viewed and interpreted all ECGs.  82bpm.  NSR.  Right bowel blanch block. Nonspecific ST and T-wave ____________________________________________  RADIOLOGY All Xrays were viewed by me. Imaging interpreted by Radiologist.  Chest x-ray portable one view: 1.  No acute cardiopulmonary abnormality. 2. Mild cardiomegaly, Calcified aortic atherosclerosis, and chronic appearing costophrenic angle fibrothorax.  __________________________________________  PROCEDURES  Procedure(s) performed: EJ accessed by myself, Dr. Reita Cliche MD -- however was blown and then non-usable.  Pressured held.  Critical Care performed: CRITICAL CARE Performed by: Lisa Roca   Total critical care time: 30 minutes  Critical care time was exclusive of separately billable procedures and treating other patients.  Critical care was necessary to treat or prevent imminent or life-threatening deterioration.  Critical care was time spent personally by me on the following activities: development of treatment plan with patient and/or surrogate as well as nursing, discussions with consultants, evaluation of patient's response to treatment, examination of patient, obtaining history from patient or surrogate, ordering and performing treatments and interventions, ordering  and review of laboratory studies, ordering and review of radiographic studies, pulse oximetry and re-evaluation of patient's condition.   ____________________________________________   ED COURSE / ASSESSMENT AND PLAN  Pertinent labs & imaging results that were available during my care of the patient were reviewed by me and considered in my medical decision making (see chart for details).   Ms. Antunes looks quite dehydrated as a result of several days of nausea and vomiting." Certain etiology for nausea and vomiting although she does have diabetes and could be gastroparesis related, she is not febrile and does not have any hypotension. She is hypertensive and is a dialysis patient has missed her dialysis. However do think that she is probably totaled body volume depleted based on her clinical exam and extremely dry mouth.  Symptoms of nausea vomiting seemed to come before the chest discomfort which I suspect is most likely gastritis related rather than ACS. EKG is nonspecific, laboratory tests are pending. I did go ahead and give her aspirin and nitroglycerin after IV established.  Patient received multiple doses of IV Zofran for ongoing nausea and vomiting.  Patient is changed to look fairly uncomfortable with ongoing nausea and vomiting. I suspect her epigastric discomfort is probably more indigestion/gastritis related. Her troponin is 0.06 which is similar to baseline minimally troponin elevation from the past.  Although no elevated potassium, or elevated white blood cell count, there is a left shift. She's not been febrile here. I don't think that she is septic at this point. However she is continuing to be symptomatic, I do think she needs observation for intractable nausea and vomiting as well as chest pain observation.    CONSULTATIONS:  Hospitalist for admission, Dr. Darvin Neighbours.   Patient / Family / Caregiver informed of clinical course, medical decision-making process, and agree with  plan.   ___________________________________________   FINAL CLINICAL IMPRESSION(S) / ED DIAGNOSES   Final diagnoses:  Intractable vomiting with nausea, unspecified vomiting type  Chest pain, unspecified type  Note: This dictation was prepared with Dragon dictation. Any transcriptional errors that result from this process are unintentional    Lisa Roca, MD 10/24/16 1025

## 2016-10-24 NOTE — Care Management Obs Status (Signed)
[facility] NOTIFICATION   Patient Details  Name: Stephanie Ellis MRN: [id number] Date of Birth: 09-Apr-1951   Medicare Observation Status Notification Given:  Yes    Beau Fanny, RN 10/24/2016, 2:02 PM

## 2016-10-24 NOTE — ED Triage Notes (Signed)
Patient presents to the ED with nausea and vomiting x 2 weeks, mostly dry heaving for the past 24 hours.  Patient didn't have dialysis yesterday due to increasing weakness.  Patient has been taking antibiotics x 1 week for a UTI and was admitted to the hospital last week for HTN.  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  Patient denies diarrhea.

## 2016-10-24 NOTE — Anesthesia Preprocedure Evaluation (Signed)
Anesthesia Evaluation  Patient identified by MRN, date of birth, ID band Patient awake    Reviewed: Allergy & Precautions, H&P , NPO status , Patient's Chart, lab work & pertinent test results, reviewed documented beta blocker date and time   History of Anesthesia Complications (+) history of anesthetic complications  Airway Mallampati: II   Neck ROM: full    Dental  (+) Poor Dentition   Pulmonary neg pulmonary ROS,    Pulmonary exam normal        Cardiovascular hypertension, Pt. on medications + CAD, + Past MI and +CHF  negative cardio ROS Normal cardiovascular exam+ dysrhythmias + Valvular Problems/Murmurs  Rhythm:regular Rate:Normal     Neuro/Psych negative neurological ROS  negative psych ROS   GI/Hepatic negative GI ROS, Neg liver ROS, GERD  Medicated,  Endo/Other  negative endocrine ROSdiabetes  Renal/GU ESRF and DialysisRenal diseasenegative Renal ROS  negative genitourinary   Musculoskeletal   Abdominal   Peds  Hematology negative hematology ROS (+) anemia ,   Anesthesia Other Findings Past Medical History: No date: Anemia No date: Anorexia No date: Bacteremia due to Pseudomonas No date: CHF (congestive heart failure) ([place]) No date: Complication of anesthesia     Comment: WITH LAST STENT 10/17. PASED OUT AND HAD TO BE              AWAKENED 01/2015: Coronary artery disease involving left main co*     Comment: [facility]: 70% LM, p-mLAD 50-60% (Resting FFR 0.75),              mRCA 80-90%, ~40 Ost OM & D1 No date: Dysrhythmia No date: ESRD (end stage renal disease) on dialysis ([facility]*     Comment: ESRD secondary to acute kidney failure s/p               CABG  DIALYSIS M/W/F No date: Essential hypertension No date: GERD (gastroesophageal reflux disease) No date: Heart murmur No date: Hypercholesterolemia No date: Myocardial infarction ([place]) 03/24/2015: S/P CABG x 3     Comment:  [facility]: Dr. Marland Kitchen Haithcock:  CABG x 3, LIMA               to LAD, SVG to RCA, SVG to OM3, EVH No date: Type II diabetes mellitus with complication ([facility]*     Comment: CAD Past Surgical History: No date: ABDOMINAL HYSTERECTOMY 05/30/2016: AV FISTULA PLACEMENT Left     Comment: Procedure: ARTERIOVENOUS graft;  Surgeon:               Algernon Huxley, MD;  Location: [facility] ORS;  Service:              Vascular;  Laterality: Left; 01/2015: CARDIAC CATHETERIZATION     Comment: [facility]: Ost LM 70%, p-m LAD 50-60% (Rest FFR + @              0.75), mRCA 80-90%, ostD1 40%, pOM1 40% 01/18/2016: CATARACT EXTRACTION W/PHACO Left     Comment: Procedure: CATARACT EXTRACTION PHACO AND               INTRAOCULAR LENS PLACEMENT ([place]);  Surgeon:               Eulogio Bear, MD;  Location: [facility] ORS;                Service: Ophthalmology;  Laterality: Left;  [place]               1.05 AP% 15.5 CDE 10.16 Fluid Pack Lot #  [id number] 08/01/2016: CATARACT EXTRACTION W/PHACO Right     Comment: Procedure: CATARACT EXTRACTION PHACO AND               INTRAOCULAR LENS PLACEMENT (IOC);  Surgeon:               Eulogio Bear, MD;  Location: [facility] ORS;                Service: Ophthalmology;  Laterality: Right;  [place]              01:00.6 AP% 11.4 CDE 6.93  LOT # [id number] No date: COLONOSCOPY No date: CORONARY ANGIOPLASTY     Comment: SENTS 02/12/16 03/28/15: CORONARY ARTERY BYPASS GRAFT     Comment:  [facility]: Dr. Waldemar Dickens: LIMA to LAD, SVG to RCA,              SVG to OM3, [facility] No date: EYE SURGERY 08/01/2016: INSERTION EXPRESS TUBE SHUNT Right     Comment: Procedure: INSERTION EXPRESS TUBE SHUNT;                Surgeon: Eulogio Bear, MD;  Location: [facility]              ORS;  Service: Ophthalmology;  Laterality:               Right; 08/15/2016: INSERTION OF AHMED VALVE Left     Comment: Procedure: INSERTION OF AHMED VALVE;  Surgeon:              Eulogio Bear, MD;  Location: [facility] ORS;                Service: Ophthalmology;  Laterality:  Left; No date: INSERTION OF DIALYSIS CATHETER January 2017: TRANSTHORACIC ECHOCARDIOGRAM     Comment:  EF 60-65%. GR 2 DD. Mild degenerative mitral               valve disease but no prolapse or regurgitation.              Mild left atrial dilation. Mild to moderate               LVH. Pericardial effusion gone 06/27/2016: UPPER EXTREMITY ANGIOGRAPHY Left     Comment: Procedure: Upper Extremity Angiography;                Surgeon: Algernon Huxley, MD;  Location: [place] CV LAB;  Service: Cardiovascular;                Laterality: Left; 08/22/2016: UPPER EXTREMITY ANGIOGRAPHY Left     Comment: Procedure: Upper Extremity Angiography;                Surgeon: Algernon Huxley, MD;  Location: [place] CV LAB;  Service: Cardiovascular;                Laterality: Left; BMI    Body Mass Index:  21.85 kg/m     Reproductive/Obstetrics negative OB ROS                             Anesthesia Physical Anesthesia Plan  ASA: III and emergent  Anesthesia Plan: General   Post-op Pain Management:    Induction:   PONV Risk Score and Plan:   Airway Management  Planned:   Additional Equipment:   Intra-op Plan:   Post-operative Plan:   Informed Consent: I have reviewed the patients History and Physical, chart, labs and discussed the procedure including the risks, benefits and alternatives for the proposed anesthesia with the patient or authorized representative who has indicated his/her understanding and acceptance.   Dental Advisory Given  Plan Discussed with: CRNA  Anesthesia Plan Comments:         Anesthesia Quick Evaluation

## 2016-10-24 NOTE — Op Note (Signed)
[facility] Gastroenterology Patient Name: Stephanie Ellis Procedure Date: 10/24/2016 4:40 PM MRN: [id number] Account #: [id number] Date of Birth: 1950-07-08 Admit Type: Inpatient Age: 66 Room: [facility] ENDO ROOM 1 Gender: Female Note Status: Finalized Procedure:            Upper GI endoscopy Indications:          Nausea with vomiting Providers:            [name] MD, MD Medicines:            Propofol per Anesthesia Complications:        No immediate complications. Procedure:            Pre-Anesthesia Assessment:                       - Prior to the procedure, a History and Physical was                        performed, and patient medications and allergies were                        reviewed. The patient's tolerance of previous                        anesthesia was also reviewed. The risks and benefits of                        the procedure and the sedation options and risks were                        discussed with the patient. All questions were                        answered, and informed consent was obtained. Prior                        Anticoagulants: The patient has taken no previous                        anticoagulant or antiplatelet agents. ASA Grade                        Assessment: II - A patient with mild systemic disease.                        After reviewing the risks and benefits, the patient was                        deemed in satisfactory condition to undergo the                        procedure.                       After obtaining informed consent, the endoscope was                        passed under direct vision. Throughout the procedure,                        the patient's blood pressure,  pulse, and oxygen                        saturations were monitored continuously. The Endoscope                        was introduced through the mouth, and advanced to the                        second part of duodenum. The upper GI endoscopy was                         accomplished without difficulty. The patient tolerated                        the procedure well. Findings:      The examined esophagus was normal.      The entire examined stomach was normal.      The examined duodenum was normal. Impression:           - Normal esophagus.                       - Normal stomach.                       - Normal examined duodenum.                       - No specimens collected. Recommendation:       - Return patient to hospital ward for ongoing care.                       - Resume previous diet. Procedure Code(s):    --- Professional ---                       [phone], Esophagogastroduodenoscopy, flexible, transoral;                        diagnostic, including collection of specimen(s) by                        brushing or washing, when performed (separate procedure) Diagnosis Code(s):    --- Professional ---                       R11.2, Nausea with vomiting, unspecified CPT copyright 2016 American Medical Association. All rights reserved. The codes documented in this report are preliminary and upon coder review may  be revised to meet current compliance requirements. [name] MD, MD 10/24/2016 4:52:41 PM This report has been signed electronically. Number of Addenda: 0 Note Initiated On: 10/24/2016 4:40 PM      [facility]

## 2016-10-24 NOTE — Transfer of Care (Signed)
Immediate Anesthesia Transfer of Care Note  Patient: Stephanie Ellis  Procedure(s) Performed: Procedure(s): ESOPHAGOGASTRODUODENOSCOPY (EGD) WITH PROPOFOL (N/A)  Patient Location: Endoscopy Unit  Anesthesia Type:General  Level of Consciousness: awake  Airway & Oxygen Therapy: Patient connected to nasal cannula oxygen  Post-op Assessment: Post -op Vital signs reviewed and stable  Post vital signs: stable  Last Vitals:  Vitals:   10/24/16 1623 10/24/16 1656  BP: (!) 138/45 (!) 132/45  Pulse: 75 71  Resp: 20 14  Temp: (!) 35.2 C (!) 35.8 C    Last Pain:  Vitals:   10/24/16 1656  TempSrc: Tympanic  PainSc:          Complications: No apparent anesthesia complications

## 2016-10-24 NOTE — ED Notes (Signed)
Patient taken off [place], trialing on RA. Will continue to monitor.

## 2016-10-24 NOTE — Anesthesia Post-op Follow-up Note (Cosign Needed)
Anesthesia QCDR form completed.        

## 2016-10-24 NOTE — ED Notes (Signed)
Patient oxygen in low 80's on room air. Placed back on 2L [place]. Oxygen at 96%. MD made aware.

## 2016-10-24 NOTE — Care Management Obs Status (Signed)
yesMEDICARE [place]   Patient Details  Name: Stephanie Ellis MRN: [id number] Date of Birth: 10-29-1950   Medicare Observation Status Notification Given:   yes    Beau Fanny, RN 10/24/2016, 11:38 AM

## 2016-10-24 NOTE — H&P (Signed)
[place] [facility] [place] NAME: Stephanie Ellis    MR#:  [id number]  DATE OF BIRTH:  07/26/1950  DATE OF ADMISSION:  10/24/2016  PRIMARY CARE PHYSICIAN: System, Pcp Not In   REQUESTING/REFERRING PHYSICIAN: Dr. Reita Cliche  CHIEF COMPLAINT:   Chief Complaint  Patient presents with  . Weakness  . Emesis    HISTORY OF PRESENT ILLNESS:  Stephanie Ellis  is a 66 y.o. female with a known history of CAD status post CABG, diabetes, hypertension, end-stage renal disease on hemodialysis MWF here due to nausea and vomiting for 2 weeks. Patient was in the hospital recently for cystitis and sent home on antibiotics. She did have vomiting during that admission. She felt better on discharge. Her intractable nausea and vomiting started again on Monday. Yesterday she missed her hemodialysis due to not feeling well. Has been unable to keep anything down including pills. Mild epigastric pain. On and off diarrhea which has resolved. She had similar problems after her CABG for which she was admitted. That was in 2016 and no further problems to it recently. No history of gastroparesis or peptic ulcer disease.  Here patient has received GI cocktail, nausea medications and still continues to vomit and is being admitted under observation.  PAST MEDICAL HISTORY:   Past Medical History:  Diagnosis Date  . Anemia   . Anorexia   . Bacteremia due to Pseudomonas   . CHF (congestive heart failure) ([place])   . Complication of anesthesia    WITH LAST STENT 10/17. PASED OUT AND HAD TO BE AWAKENED  . Coronary artery disease involving left main coronary artery 01/2015   [facility]: 70% LM, p-mLAD 50-60% (Resting FFR 0.75), mRCA 80-90%, ~40 Ost OM & D1  . Dysrhythmia   . ESRD (end stage renal disease) on dialysis [facility])    ESRD secondary to acute kidney failure s/p CABG  DIALYSIS M/W/F  . Essential hypertension   . GERD (gastroesophageal reflux disease)   . Heart murmur   . Hypercholesterolemia    . Myocardial infarction ([place])   . 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 Marland Kitchen Haithcock: CABG x 3, LIMA to LAD, SVG to RCA, SVG to OM3, EVH  . Type II diabetes mellitus with complication (HCC)    CAD    PAST SURGICAL HISTORY:   Past Surgical History:  Procedure Laterality Date  . ABDOMINAL HYSTERECTOMY    . AV FISTULA PLACEMENT Left 05/30/2016   Procedure: ARTERIOVENOUS graft;  Surgeon: Algernon Huxley, MD;  Location: [facility] ORS;  Service: Vascular;  Laterality: Left;  . CARDIAC CATHETERIZATION  01/2015   [facility]: Ost LM 70%, p-m LAD 50-60% (Rest FFR + @ 0.75), mRCA 80-90%, ostD1 40%, pOM1 40%  . CATARACT EXTRACTION W/PHACO Left 01/18/2016   Procedure: CATARACT EXTRACTION PHACO AND INTRAOCULAR LENS PLACEMENT (IOC);  Surgeon: Eulogio Bear, MD;  Location: [facility] ORS;  Service: Ophthalmology;  Laterality: Left;  [place] 1.05 AP% 15.5 CDE 10.16 Fluid Pack Lot # [id number]  . CATARACT EXTRACTION W/PHACO Right 08/01/2016   Procedure: CATARACT EXTRACTION PHACO AND INTRAOCULAR LENS PLACEMENT (IOC);  Surgeon: Eulogio Bear, MD;  Location: [facility] ORS;  Service: Ophthalmology;  Laterality: Right;  [place] 01:00.6 AP% 11.4 CDE 6.93  LOT # [id number]  . COLONOSCOPY    . CORONARY ANGIOPLASTY     SENTS 02/12/16  . CORONARY ARTERY BYPASS GRAFT  03/28/15    [facility]: Dr. Waldemar Dickens: LIMA to LAD, SVG to RCA, SVG to  OM3, EVH  . EYE SURGERY    . INSERTION EXPRESS TUBE SHUNT Right 08/01/2016   Procedure: INSERTION EXPRESS TUBE SHUNT;  Surgeon: Eulogio Bear, MD;  Location: [facility] ORS;  Service: Ophthalmology;  Laterality: Right;  . INSERTION OF AHMED VALVE Left 08/15/2016   Procedure: INSERTION OF AHMED VALVE;  Surgeon: Eulogio Bear, MD;  Location: [facility] ORS;  Service: Ophthalmology;  Laterality: Left;  . INSERTION OF DIALYSIS CATHETER    . TRANSTHORACIC ECHOCARDIOGRAM  January 2017    EF 60-65%. GR 2 DD. Mild degenerative mitral valve disease but no prolapse or regurgitation. Mild left atrial dilation. Mild to  moderate LVH. Pericardial effusion gone  . UPPER EXTREMITY ANGIOGRAPHY Left 06/27/2016   Procedure: Upper Extremity Angiography;  Surgeon: Algernon Huxley, MD;  Location: [place] [facility];  Service: Cardiovascular;  Laterality: Left;  . UPPER EXTREMITY ANGIOGRAPHY Left 08/22/2016   Procedure: Upper Extremity Angiography;  Surgeon: Algernon Huxley, MD;  Location: [place] [facility];  Service: Cardiovascular;  Laterality: Left;    SOCIAL HISTORY:   Social History  Substance Use Topics  . Smoking status: Never Smoker  . Smokeless tobacco: Never Used  . Alcohol use No    FAMILY HISTORY:   Family History  Problem Relation Age of Onset  . Diabetes Mellitus II Mother     DRUG ALLERGIES:   Allergies  Allergen Reactions  . Chlorthalidone Anaphylaxis, Itching and Rash  . Fentanyl Rash  . Midazolam Rash  . Ace Inhibitors Other (See Comments)    Reaction:  Hyperkalemia, agitation   . Angiotensin Receptor Blockers Other (See Comments)    Reaction:  Hyperkalemia, agitation   . Norvasc [Amlodipine] Itching and Rash  . Phenergan [Promethazine Hcl] Anxiety    "antsy, can't sit still"    REVIEW OF SYSTEMS:   Review of Systems  Constitutional: Positive for malaise/fatigue. Negative for chills, fever and weight loss.  HENT: Negative for hearing loss and nosebleeds.   Eyes: Negative for blurred vision, double vision and pain.  Respiratory: Negative for cough, hemoptysis, sputum production, shortness of breath and wheezing.   Cardiovascular: Negative for chest pain, palpitations, orthopnea and leg swelling.  Gastrointestinal: Positive for abdominal pain, nausea and vomiting. Negative for constipation and diarrhea.  Genitourinary: Negative for dysuria and hematuria.  Musculoskeletal: Negative for back pain, falls and myalgias.  Skin: Negative for rash.  Neurological: Positive for weakness. Negative for dizziness, tremors, sensory change, speech change, focal weakness, seizures and headaches.   Endo/Heme/Allergies: Does not bruise/bleed easily.  Psychiatric/Behavioral: Negative for depression and memory loss. The patient is not nervous/anxious.     MEDICATIONS AT HOME:   Prior to Admission medications   Medication Sig Start Date End Date Taking? Authorizing Provider  aspirin EC 81 MG tablet Take 1 tablet (81 mg total) by mouth daily. 10/19/16  Yes Dustin Flock, MD  atorvastatin (LIPITOR) 40 MG tablet Take 40 mg by mouth daily at 6 PM.    Yes [provider]  cefUROXime (CEFTIN) 500 MG tablet Take 1 tablet (500 mg total) by mouth 2 (two) times daily with a meal. 10/19/16  Yes Dustin Flock, MD  cholecalciferol (VITAMIN D) 400 units TABS tablet Take 400 Units by mouth daily.    Yes [provider]  Difluprednate (DUREZOL) 0.05 % EMUL Apply 1 drop to eye 4 (four) times daily.   Yes [provider]  famotidine (PEPCID) 20 MG tablet Take 1 tablet (20 mg total) by mouth 2 (two) times daily.  Patient taking differently: Take 20 mg by mouth daily as needed for heartburn.  09/05/16  Yes Wieting, Richard, MD  insulin glargine (LANTUS) 100 unit/mL SOPN Inject 14 Units into the skin every evening.    Yes [provider]  isosorbide mononitrate (IMDUR) 30 MG 24 hr tablet Take 1 tablet (30 mg total) by mouth daily. 09/05/16  Yes Wieting, Richard, MD  metoprolol tartrate (LOPRESSOR) 25 MG tablet 1/2 tablet po twice a day Patient taking differently: Take 12.5 mg by mouth 2 (two) times daily. 1/2 tablet po twice a day 09/05/16  Yes Wieting, Richard, MD  Multiple Vitamin (MULTIVITAMIN WITH MINERALS) TABS tablet Take 2 tablets by mouth daily. Gummy vitamins   Yes [provider]  nitroGLYCERIN (NITROSTAT) 0.4 MG SL tablet Place 1 tablet (0.4 mg total) under the tongue every 5 (five) minutes as needed for chest pain. 09/05/16  Yes Wieting, Richard, MD  ondansetron (ZOFRAN) 4 MG tablet Take 4 mg by mouth every 8 (eight) hours as needed for nausea or vomiting.    Yes [provider]  sevelamer carbonate (RENVELA) 800 MG tablet Take 800 mg by mouth 3 (three) times daily with meals.   Yes [provider]  clopidogrel (PLAVIX) 75 MG tablet Take 1 tablet (75 mg total) by mouth daily. dont take for next five day Patient not taking: Reported on 10/24/2016 10/19/16   Dustin Flock, MD     VITAL SIGNS:  Blood pressure (!) 210/94, pulse 81, temperature 98.1 F (36.7 C), temperature source Oral, resp. rate 10, height 5' 4"  (1.626 m), weight 57.6 kg (127 lb), SpO2 95 %.  PHYSICAL EXAMINATION:  Physical Exam  GENERAL:  66 y.o.-year-old patient lying in the bed with distress due to vomiting EYES: Pupils equal, round, reactive to light and accommodation. No scleral icterus. Extraocular muscles intact.  HEENT: Head atraumatic, normocephalic. Oropharynx and nasopharynx clear. No oropharyngeal erythema, moist oral mucosa  NECK:  Supple, no jugular venous distention. No thyroid enlargement, no tenderness.  LUNGS: Normal breath sounds bilaterally, no wheezing, rales, rhonchi. No use of accessory muscles of respiration.  CARDIOVASCULAR: S1, S2 normal. No murmurs, rubs, or gallops.  ABDOMEN: Soft, , nondistended. Bowel sounds present. No organomegaly or mass. Epigastric tenderness EXTREMITIES: No pedal edema, cyanosis, or clubbing. + 2 pedal & radial pulses b/l.   NEUROLOGIC: Cranial nerves II through XII are intact. No focal Motor or sensory deficits appreciated b/l PSYCHIATRIC: The patient is alert and oriented x 3. Good affect.  SKIN: No obvious rash, lesion, or ulcer.   LABORATORY PANEL:   CBC  Recent Labs Lab 10/24/16 0846  WBC 8.1  HGB 12.7  HCT 39.1  PLT 178   ------------------------------------------------------------------------------------------------------------------  Chemistries   Recent Labs Lab 10/24/16 0846  NA 139  K 4.0  CL 100*  CO2 26  GLUCOSE 197*  BUN 48*  CREATININE 6.05*  CALCIUM 9.5  AST 27  ALT 22   ALKPHOS 94  BILITOT 0.9   ------------------------------------------------------------------------------------------------------------------  Cardiac Enzymes  Recent Labs Lab 10/24/16 0846  TROPONINI 0.06*   ------------------------------------------------------------------------------------------------------------------  RADIOLOGY:  Dg Chest Port 1 View  Result Date: 10/24/2016 CLINICAL DATA:  66 year old female with chest pain today. Nausea and vomiting for 1 week. EXAM: PORTABLE CHEST 1 VIEW COMPARISON:  09/04/2016.  CT Abdomen and Pelvis 10/17/2016 FINDINGS: Portable AP upright view at 0807 hours. Stable right chest dual lumen dialysis type catheter. Stable cardiac size and mediastinal contours. Prior CABG. Stable blunting of both costophrenic angles which appears related  to fibrothorax on the comparison CT. Stable mild cardiomegaly. Calcified aortic atherosclerosis. No pneumothorax, pulmonary edema or acute pulmonary opacity. IMPRESSION: 1.  No acute cardiopulmonary abnormality. 2. Mild cardiomegaly, Calcified aortic atherosclerosis, and chronic appearing costophrenic angle fibrothorax. Electronically Signed   By: Genevie Ann M.D.   On: 10/24/2016 08:25     IMPRESSION AND PLAN:   * Vomiting for 2 week With epigastric pain. Could be gastritis or peptic ulcer disease or gastroparesis are pancreatitis. Check lipase level. Liver function tests are normal. No right upper quadrant tenderness. Start IV Protonix. Scheduled Reglan. Zofran as needed. Will consult GI to consider EGD. Since CT scan of the abdomen has been reviewed. Patient is afebrile.  * End-stage renal disease on hemodialysis Missed her hemodialysis yesterday. We'll consult nephrology for patient's dialysis needs in the hospital.  * Insulin-dependent diabetes mellitus. Will continue her Lantus at lower dose. Add sliding scale insulin.  *Hypertension. Continue home medications with as needed IV hydralazine.  * Mild  elevation of troponin at 0.06 is chronic. No chest pain.  * DVT prophylaxis with heparin.   All the records are reviewed and case discussed with ED provider. Management plans discussed with the patient, family and they are in agreement.  CODE STATUS: FULL CODE  TOTAL TIME TAKING CARE OF THIS PATIENT: 40 minutes.   Hillary Bow R M.D on 10/24/2016 at 10:57 AM  Between 7am to 6pm - Pager - [phone]  After 6pm go to [email or web address] - password [email or web address] [facility]  Office  [phone]  CC: Primary care physician; System, Pcp Not In  Note: This dictation was prepared with Dragon dictation along with smaller phrase technology. Any transcriptional errors that result from this process are unintentional.

## 2016-10-25 DIAGNOSIS — M79642 Pain in left hand: Secondary | ICD-10-CM | POA: Diagnosis not present

## 2016-10-25 DIAGNOSIS — N186 End stage renal disease: Secondary | ICD-10-CM

## 2016-10-25 DIAGNOSIS — G458 Other transient cerebral ischemic attacks and related syndromes: Secondary | ICD-10-CM

## 2016-10-25 DIAGNOSIS — R0789 Other chest pain: Secondary | ICD-10-CM | POA: Diagnosis not present

## 2016-10-25 LAB — BASIC METABOLIC PANEL
Anion gap: 11 (ref 5–15)
BUN: 57 mg/dL — ABNORMAL HIGH (ref 6–20)
CO2: 25 mmol/L (ref 22–32)
Calcium: 8.8 mg/dL — ABNORMAL LOW (ref 8.9–10.3)
Chloride: 100 mmol/L — ABNORMAL LOW (ref 101–111)
Creatinine, Ser: 6.77 mg/dL — ABNORMAL HIGH (ref 0.44–1.00)
GFR calc Af Amer: 7 mL/min — ABNORMAL LOW (ref >60)
GFR calc non Af Amer: 6 mL/min — ABNORMAL LOW (ref >60)
Glucose, Bld: 109 mg/dL — ABNORMAL HIGH (ref 65–99)
Potassium: 4.3 mmol/L (ref 3.5–5.1)
Sodium: 136 mmol/L (ref 135–145)

## 2016-10-25 LAB — CBC
HCT: 36.4 % (ref 35.0–47.0)
Hemoglobin: 11.9 g/dL — ABNORMAL LOW (ref 12.0–16.0)
MCH: 30.2 pg (ref 26.0–34.0)
MCHC: 32.7 g/dL (ref 32.0–36.0)
MCV: 92.4 fL (ref 80.0–100.0)
Platelets: 149 10*3/uL — ABNORMAL LOW (ref 150–440)
RBC: 3.93 MIL/uL (ref 3.80–5.20)
RDW: 19.9 % — ABNORMAL HIGH (ref 11.5–14.5)
WBC: 5.7 10*3/uL (ref 3.6–11.0)

## 2016-10-25 LAB — GLUCOSE, CAPILLARY
Glucose-Capillary: 117 mg/dL — ABNORMAL HIGH (ref 65–99)
Glucose-Capillary: 157 mg/dL — ABNORMAL HIGH (ref 65–99)

## 2016-10-25 LAB — HEMOGLOBIN A1C
Hgb A1c MFr Bld: 6.9 % — ABNORMAL HIGH (ref 4.8–5.6)
Mean Plasma Glucose: 151 mg/dL

## 2016-10-25 LAB — TROPONIN I: Troponin I: 0.08 ng/mL (ref <0.03)

## 2016-10-25 MED ORDER — METOCLOPRAMIDE HCL 10 MG PO TABS: 10.0000 mg | ORAL_TABLET | Freq: Four times a day (QID) | ORAL | Status: DC | PRN

## 2016-10-25 MED ORDER — METOCLOPRAMIDE HCL 10 MG PO TABS: 10.0000 mg | ORAL_TABLET | Freq: Four times a day (QID) | ORAL | 0 refills | Status: DC | PRN

## 2016-10-25 MED ORDER — LIDOCAINE 4 % EX CREA
TOPICAL_CREAM | CUTANEOUS | Status: DC
  Filled 2016-10-25: qty 5

## 2016-10-25 NOTE — Progress Notes (Signed)
[facility]  ROUNDING NOTE   Subjective:   EGD with no findings. Continues to have some nausea  Hemodialysis for later today.   Objective:  Vital signs in last 24 hours:  Temp:  [95.3 F (35.2 C)-98.7 F (37.1 C)] 98.7 F (37.1 C) (07/06 0531) Pulse Rate:  [70-75] 72 (07/06 0531) Resp:  [14-22] 20 (07/06 0531) BP: (116-185)/(44-73) 168/71 (07/06 0531) SpO2:  [95 %-100 %] 96 % (07/06 0531) Weight:  [56.8 kg (125 lb 4.8 oz)-57.6 kg (127 lb)] 56.8 kg (125 lb 4.8 oz) (07/06 0500)  Weight change:  Filed Weights   10/24/16 1239 10/24/16 1623 10/25/16 0500  Weight: 57.7 kg (127 lb 4.8 oz) 57.6 kg (127 lb) 56.8 kg (125 lb 4.8 oz)    Intake/Output: I/O last 3 completed shifts: In: 504 [I.V.:4; IV Piggyback:500] Out: 0    Intake/Output this shift:  Total I/O In: 480 [P.O.:480] Out: -   Physical Exam: General: No acute distress  Head: Normocephalic, atraumatic. Moist oral mucosal membranes  Eyes: Anicteric  Neck: Supple, trachea midline  Lungs:  Clear to auscultation, normal effort  Heart: S1S2 no rubs  Abdomen:  Soft, nontender, bowel sounds present  Extremities: No peripheral edema.  Neurologic: Awake, alert, following commands  Skin: No lesions  Access: Left forearm AVG, RIJ permcath    Basic Metabolic Panel:  Recent Labs Lab 10/18/16 1600 10/19/16 0309 10/24/16 0846 10/25/16 0509  NA  --  137 139 136  K  --  3.4* 4.0 4.3  CL  --  98* 100* 100*  CO2  --  33* 26 25  GLUCOSE  --  164* 197* 109*  BUN  --  23* 48* 57*  CREATININE  --  4.62* 6.05* 6.77*  CALCIUM  --  8.7* 9.5 8.8*  PHOS 2.0*  --   --   --     Liver Function Tests:  Recent Labs Lab 10/24/16 0846  AST 27  ALT 22  ALKPHOS 94  BILITOT 0.9  PROT 7.9  ALBUMIN 4.1    Recent Labs Lab 10/24/16 0846  LIPASE 25   No results for input(s): AMMONIA in the last 168 hours.  CBC:  Recent Labs Lab 10/19/16 0309 10/24/16 0846 10/25/16 0509  WBC 6.6 8.1 5.7  NEUTROABS  --   6.8*  --   HGB 12.0 12.7 11.9*  HCT 36.4 39.1 36.4  MCV 91.1 90.9 92.4  PLT 160 178 149*    Cardiac Enzymes:  Recent Labs Lab 10/24/16 0846 10/25/16 1022  TROPONINI 0.06* 0.08*    BNP: Invalid input(s): POCBNP  CBG:  Recent Labs Lab 10/24/16 1345 10/24/16 1637 10/24/16 2154 10/25/16 0731 10/25/16 1147  GLUCAP 206* 186* 182* 117* 157*    Microbiology: Results for orders placed or performed during the hospital encounter of 10/17/16  Urine culture     Status: Abnormal   Collection Time: 10/17/16  4:34 PM  Result Value Ref Range Status   Specimen Description URINE, RANDOM  Final   Special Requests Normal  Final   Culture MULTIPLE SPECIES PRESENT, SUGGEST RECOLLECTION (A)  Final   Report Status 10/19/2016 FINAL  Final    Coagulation Studies: No results for input(s): LABPROT, INR in the last 72 hours.  Urinalysis: No results for input(s): COLORURINE, LABSPEC, PHURINE, GLUCOSEU, HGBUR, BILIRUBINUR, KETONESUR, PROTEINUR, UROBILINOGEN, NITRITE, LEUKOCYTESUR in the last 72 hours.  Invalid input(s): APPERANCEUR    Imaging: Dg Chest Port 1 View  Result Date: 10/24/2016 CLINICAL DATA:  66 year old female  with chest pain today. Nausea and vomiting for 1 week. EXAM: PORTABLE CHEST 1 VIEW COMPARISON:  09/04/2016.  CT Abdomen and Pelvis 10/17/2016 FINDINGS: Portable AP upright view at 0807 hours. Stable right chest dual lumen dialysis type catheter. Stable cardiac size and mediastinal contours. Prior CABG. Stable blunting of both costophrenic angles which appears related to fibrothorax on the comparison CT. Stable mild cardiomegaly. Calcified aortic atherosclerosis. No pneumothorax, pulmonary edema or acute pulmonary opacity. IMPRESSION: 1.  No acute cardiopulmonary abnormality. 2. Mild cardiomegaly, Calcified aortic atherosclerosis, and chronic appearing costophrenic angle fibrothorax. Electronically Signed   By: Genevie Ann M.D.   On: 10/24/2016 08:25     Medications:    .  aspirin EC  81 mg Oral Daily  . atorvastatin  40 mg Oral q1800  . Difluprednate  1 drop Ophthalmic QID  . heparin  5,000 Units Subcutaneous Q8H  . insulin aspart  0-9 Units Subcutaneous TID WC  . insulin glargine  10 Units Subcutaneous QPM  . isosorbide mononitrate  30 mg Oral Daily  . lidocaine   Topical STAT  . metoprolol tartrate  12.5 mg Oral BID  . pantoprazole (PROTONIX) IV  40 mg Intravenous Q24H  . sevelamer carbonate  800 mg Oral TID WC   acetaminophen **OR** acetaminophen, albuterol, hydrALAZINE, metoCLOPramide, nitroGLYCERIN, ondansetron **OR** ondansetron (ZOFRAN) IV, polyethylene glycol, traMADol  Assessment/ Plan:  [name] is a 66 y.o. black female with end stage renal disease on hemodialysis, hypertension, congestive heart failure, coronary artery disease status post CABG, GERD admitted to [facility] on 10/24/2016 for Chest pain, unspecified type [R07.9] Intractable vomiting with nausea, unspecified vomiting type [R11.2]   MWF [facility] Nephrology [place] AVG  1. ESRD on HD MWF: missed dialysis on 7/4 - hemodialysis for later today. Orders prepared. Resume MWF - Consult vascular to have RIJ permcath removed.   2. Anemia chronic kidney disease. Hemoglobin 11.9 No source of GI bleed on endoscopy - Appreciate GI input.  - mircera as outpatient  3. Secondary hyperparathyroidism. - sevelamer  4. Urinary Tract Infection: on PO ceftin.   5. Hypertension: not well controlled.  - IV hydralazine q4 PRN - home regimen of metoprolol   LOS: 0 Stephanie Ellis 7/6/20181:40 PM

## 2016-10-25 NOTE — Discharge Summary (Signed)
[facility] [place] NAME: Stephanie Ellis    MR#:  [id number]  DATE OF BIRTH:  [birth year]  DATE OF ADMISSION:  10/24/2016   ADMITTING PHYSICIAN: Algernon Huxley, MD  DATE OF DISCHARGE: 10/25/2016  PRIMARY CARE PHYSICIAN: System, Pcp Not In   ADMISSION DIAGNOSIS:  Chest pain, unspecified type [R07.9] Intractable vomiting with nausea, unspecified vomiting type [R11.2] DISCHARGE DIAGNOSIS:  Active Problems:   Vomiting  SECONDARY DIAGNOSIS:   Past Medical History:  Diagnosis Date  . Anemia   . Anorexia   . Bacteremia due to Pseudomonas   . CHF (congestive heart failure) ([place])   . Complication of anesthesia    WITH LAST STENT 10/17. PASED OUT AND HAD TO BE AWAKENED  . Coronary artery disease involving left main coronary artery 01/2015   [facility]: 70% LM, p-mLAD 50-60% (Resting FFR 0.75), mRCA 80-90%, ~40 Ost OM & D1  . Dysrhythmia   . ESRD (end stage renal disease) on dialysis [facility])    ESRD secondary to acute kidney failure s/p CABG  DIALYSIS M/W/F  . Essential hypertension   . GERD (gastroesophageal reflux disease)   . Heart murmur   . Hypercholesterolemia   . Myocardial infarction ([place])   . 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 Marland Kitchen [place]: CABG x 3, LIMA to LAD, SVG to RCA, SVG to OM3, EVH  . Type II diabetes mellitus with complication (HCC)    CAD   HOSPITAL COURSE:   * Vomiting for 2 week With epigastric pain.  Per EGD, no gastritis or peptic ulcer disease. possible gastroparesis. on Reglan. Zofran as needed. The patient had mild nausea today, but no vomiting or diarrhea or abdominal pain.  * End-stage renal disease on hemodialysis Continue hemodialysis as scheduled.  * Insulin-dependent diabetes mellitus.  continue her Lantus and sliding scale insulin.  *Hypertension. Continue home medications with as needed IV hydralazine.  * Mild elevation of troponin at 0.06 is chronic. No chest pain.  DISCHARGE CONDITIONS:    Stable, discharge to home today. CONSULTS OBTAINED:  Treatment Team:  [name], MD Lavonia Dana, MD Schnier, Dolores Lory, MD DRUG ALLERGIES:   Allergies  Allergen Reactions  . Chlorthalidone Anaphylaxis, Itching and Rash  . Fentanyl Rash  . Midazolam Rash  . Ace Inhibitors Other (See Comments)    Reaction:  Hyperkalemia, agitation   . Angiotensin Receptor Blockers Other (See Comments)    Reaction:  Hyperkalemia, agitation   . Norvasc [Amlodipine] Itching and Rash  . Phenergan [Promethazine Hcl] Anxiety    "antsy, can't sit still"   DISCHARGE MEDICATIONS:   Allergies as of 10/25/2016      Reactions   Chlorthalidone Anaphylaxis, Itching, Rash   Fentanyl Rash   Midazolam Rash   Ace Inhibitors Other (See Comments)   Reaction:  Hyperkalemia, agitation    Angiotensin Receptor Blockers Other (See Comments)   Reaction:  Hyperkalemia, agitation    Norvasc [amlodipine] Itching, Rash   Phenergan [promethazine Hcl] Anxiety   "antsy, can't sit still"      Medication List    STOP taking these medications   ondansetron 4 MG tablet Commonly known as:  ZOFRAN     TAKE these medications   aspirin EC 81 MG tablet Take 1 tablet (81 mg total) by mouth daily.   atorvastatin 40 MG tablet Commonly known as:  LIPITOR Take 40 mg by mouth daily at 6 PM.   ATROPINE SULFATE-NACL OP Apply 1  drop to eye 2 (two) times daily. Left eye only   cefUROXime 500 MG tablet Commonly known as:  CEFTIN Take 1 tablet (500 mg total) by mouth 2 (two) times daily with a meal.   cholecalciferol 400 units Tabs tablet Commonly known as:  VITAMIN D Take 400 Units by mouth daily.   clopidogrel 75 MG tablet Commonly known as:  PLAVIX Take 1 tablet (75 mg total) by mouth daily. dont take for next five day   DUREZOL 0.05 % Emul Generic drug:  Difluprednate Apply 1 drop to eye 4 (four) times daily.   famotidine 20 MG tablet Commonly known as:  PEPCID Take 1 tablet (20 mg total) by mouth 2 (two)  times daily. What changed:  when to take this  reasons to take this   insulin glargine 100 unit/mL Sopn Commonly known as:  LANTUS Inject 14 Units into the skin every evening.   isosorbide mononitrate 30 MG 24 hr tablet Commonly known as:  IMDUR Take 1 tablet (30 mg total) by mouth daily.   metoCLOPramide 10 MG tablet Commonly known as:  REGLAN Take 1 tablet (10 mg total) by mouth every 6 (six) hours as needed for nausea, vomiting or refractory nausea / vomiting.   metoprolol tartrate 25 MG tablet Commonly known as:  LOPRESSOR 1/2 tablet po twice a day What changed:  how much to take  how to take this  when to take this  additional instructions   multivitamin with minerals Tabs tablet Take 2 tablets by mouth daily. Gummy vitamins   nitroGLYCERIN 0.4 MG SL tablet Commonly known as:  NITROSTAT Place 1 tablet (0.4 mg total) under the tongue every 5 (five) minutes as needed for chest pain.   sevelamer carbonate 800 MG tablet Commonly known as:  RENVELA Take 800 mg by mouth 3 (three) times daily with meals.        DISCHARGE INSTRUCTIONS:  See AVS.  If you experience worsening of your admission symptoms, develop shortness of breath, life threatening emergency, suicidal or homicidal thoughts you must seek medical attention immediately by calling 911 or calling your MD immediately  if symptoms less severe.  You Must read complete instructions/literature along with all the possible adverse reactions/side effects for all the Medicines you take and that have been prescribed to you. Take any new Medicines after you have completely understood and accpet all the possible adverse reactions/side effects.   Please note  You were cared for by a hospitalist during your hospital stay. If you have any questions about your discharge medications or the care you received while you were in the hospital after you are discharged, you can call the unit and asked to speak with the  hospitalist on call if the hospitalist that took care of you is not available. Once you are discharged, your primary care physician will handle any further medical issues. Please note that NO REFILLS for any discharge medications will be authorized once you are discharged, as it is imperative that you return to your primary care physician (or establish a relationship with a primary care physician if you do not have one) for your aftercare needs so that they can reassess your need for medications and monitor your lab values.    On the day of Discharge:  VITAL SIGNS:  Blood pressure (!) 168/71, pulse 72, temperature 98.7 F (37.1 C), resp. rate 20, height 5' 4"  (1.626 m), weight 125 lb 4.8 oz (56.8 kg), SpO2 96 %. PHYSICAL EXAMINATION:  GENERAL:  66 y.o.-year-old patient lying in the bed with no acute distress.  EYES: Pupils equal, round, reactive to light and accommodation. No scleral icterus. Extraocular muscles intact.  HEENT: Head atraumatic, normocephalic. Oropharynx and nasopharynx clear.  NECK:  Supple, no jugular venous distention. No thyroid enlargement, no tenderness.  LUNGS: Normal breath sounds bilaterally, no wheezing, rales,rhonchi or crepitation. No use of accessory muscles of respiration.  CARDIOVASCULAR: S1, S2 normal. No murmurs, rubs, or gallops.  ABDOMEN: Soft, non-tender, non-distended. Bowel sounds present. No organomegaly or mass.  EXTREMITIES: No pedal edema, cyanosis, or clubbing.  NEUROLOGIC: Cranial nerves II through XII are intact. Muscle strength 5/5 in all extremities. Sensation intact. Gait not checked.  PSYCHIATRIC: The patient is alert and oriented x 3.  SKIN: No obvious rash, lesion, or ulcer.  DATA REVIEW:   CBC  Recent Labs Lab 10/25/16 0509  WBC 5.7  HGB 11.9*  HCT 36.4  PLT 149*    Chemistries   Recent Labs Lab 10/24/16 0846 10/25/16 0509  NA 139 136  K 4.0 4.3  CL 100* 100*  CO2 26 25  GLUCOSE 197* 109*  BUN 48* 57*  CREATININE 6.05*  6.77*  CALCIUM 9.5 8.8*  AST 27  --   ALT 22  --   ALKPHOS 94  --   BILITOT 0.9  --      Microbiology Results  Results for orders placed or performed during the hospital encounter of 10/17/16  Urine culture     Status: Abnormal   Collection Time: 10/17/16  4:34 PM  Result Value Ref Range Status   Specimen Description URINE, RANDOM  Final   Special Requests Normal  Final   Culture MULTIPLE SPECIES PRESENT, SUGGEST RECOLLECTION (A)  Final   Report Status 10/19/2016 FINAL  Final    RADIOLOGY:  No results found.   Management plans discussed with the patient, family and they are in agreement.  CODE STATUS: Full Code   TOTAL TIME TAKING CARE OF THIS PATIENT: 26 minutes.    Demetrios Loll M.D on 10/25/2016 at 1:58 PM  Between 7am to 6pm - Pager - [phone]  After 6pm go to [email or web address] - Proofreader  [facility]  Office  [phone]  CC: Primary care physician; System, Pcp Not In   Note: This dictation was prepared with Dragon dictation along with smaller phrase technology. Any transcriptional errors that result from this process are unintentional.

## 2016-10-25 NOTE — Progress Notes (Signed)
[facility]  Daily Progress Note   Subjective  -  Patient continues to complain of hand pain, left hand status post forearm loop graft creation. She states the pain is constant. She notes that being on dialysis does not seem to exacerbate her pain. She cannot find any alleviating factors.  She states it is quite severe.  She was scheduled for graft banding on 11/03/2016 but felt nauseated and decided not to have her surgery. Instead she came to the hospital and was admitted for further treatment.  Objective Vitals:   10/25/16 1630 10/25/16 1700 10/25/16 1730 10/25/16 1745  BP: (!) 193/82 (!) 198/80 (!) 196/86 (!) 194/86  Pulse: 75 76 75 75  Resp:      Temp:    98.5 F (36.9 C)  TempSrc:    Oral  SpO2:      Weight:      Height:        Intake/Output Summary (Last 24 hours) at 10/25/16 1831 Last data filed at 10/25/16 1745  Gross per 24 hour  Intake              480 ml  Output             1000 ml  Net             -520 ml    PULM  Normal effort , no use of accessory muscles CV  No JVD, RRR Abd      No distended, nontender VASC  She is holding her left hand and rubbing it with her right hand upon my entering dialysis. She recently has been taken off the machine and pressures being held over her AV graft because of recent decannulation.  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 Dr. Lucky Cowboy has plans to reschedule her banding and there is a moderate but significant chance that banding or access will result in thrombosis. This would require immediate reinsertion of the catheter should we remove it now. She clearly is severely symptomatic. I recommend that she be discharged this evening and we will reschedule her banding procedure for next week. Patient is aware and agrees with this plan.    Hortencia Pilar  10/25/2016, 6:31 PM

## 2016-10-25 NOTE — Progress Notes (Signed)
Patient discharged to home. Concerns addressed. IV site removed

## 2016-10-25 NOTE — Progress Notes (Signed)
Post dialysis assessment

## 2016-10-25 NOTE — Anesthesia Postprocedure Evaluation (Signed)
Anesthesia Post Note  Patient: Stephanie Ellis  Procedure(s) Performed: Procedure(s) (LRB): ESOPHAGOGASTRODUODENOSCOPY (EGD) WITH PROPOFOL (N/A)  Patient location during evaluation: PACU Anesthesia Type: General Level of consciousness: awake and alert Pain management: pain level controlled Vital Signs Assessment: post-procedure vital signs reviewed and stable Respiratory status: spontaneous breathing, nonlabored ventilation, respiratory function stable and patient connected to nasal cannula oxygen Cardiovascular status: blood pressure returned to baseline and stable Postop Assessment: no signs of nausea or vomiting Anesthetic complications: no     Last Vitals:  Vitals:   10/24/16 2241 10/25/16 0531  BP: (!) 135/52 (!) 168/71  Pulse: 75 72  Resp:  20  Temp:  37.1 C    Last Pain:  Vitals:   10/25/16 1030  TempSrc:   PainSc: 7                  Molli Barrows

## 2016-10-25 NOTE — Progress Notes (Signed)
Hd completed

## 2016-10-25 NOTE — Progress Notes (Signed)
Per Vascular patient perm cath to be removed tomorrow prior discharge. Patient agreed. Nephrology and Hospitalist aware.

## 2016-10-25 NOTE — Progress Notes (Signed)
PRE DIALYSIS ASSESSMENT 

## 2016-10-25 NOTE — Discharge Instructions (Signed)
ADA and renal diet.

## 2016-10-25 NOTE — Care Management (Signed)
[facility] liaison notified of admission and discharge

## 2016-10-25 NOTE — Progress Notes (Signed)
HD STARTED  

## 2016-10-28 ENCOUNTER — Telehealth: Payer: Self-pay | Admitting: Urology

## 2016-10-28 NOTE — Telephone Encounter (Signed)
App made and spoke with the patient. Mailed app to patient.  Stephanie Ellis

## 2016-10-28 NOTE — Telephone Encounter (Signed)
-----   Message from Nickie Retort, MD sent at 10/18/2016  9:04 AM EDT ----- Patient needs to see me in one month for cysto. Still admitted to hospital currently. thanks

## 2016-10-29 ENCOUNTER — Encounter: Payer: Self-pay | Admitting: Gastroenterology

## 2016-11-19 ENCOUNTER — Telehealth (INDEPENDENT_AMBULATORY_CARE_PROVIDER_SITE_OTHER): Payer: Self-pay

## 2016-11-19 NOTE — Telephone Encounter (Signed)
Sharrie Rothman from [facility] called wanting to know if the patient can have her permcath removed now.

## 2016-11-20 ENCOUNTER — Encounter (INDEPENDENT_AMBULATORY_CARE_PROVIDER_SITE_OTHER): Payer: Self-pay

## 2016-11-20 NOTE — Telephone Encounter (Signed)
Patient will be scheduled to remove her permcath.

## 2016-11-28 ENCOUNTER — Encounter: Payer: Self-pay | Admitting: Urology

## 2016-11-28 ENCOUNTER — Ambulatory Visit (INDEPENDENT_AMBULATORY_CARE_PROVIDER_SITE_OTHER): Payer: Medicare Other | Admitting: Urology

## 2016-11-28 VITALS — BP 125/58 | HR 57 | Ht 64.0 in | Wt 121.3 lb

## 2016-11-28 DIAGNOSIS — R310 Gross hematuria: Secondary | ICD-10-CM | POA: Diagnosis not present

## 2016-11-28 NOTE — Progress Notes (Signed)
   11/28/16  CC:  Chief Complaint  Patient presents with  . Cysto    +-    HPI: The patient is a 66 year old female with multiple medical comorbidities including end-stage renal disease on hemodialysis who presents for completion of her gross hematuria workup. She was initially seen in the hospital for gross hematuria after she was admitted for elevated blood pressure found during a preadmission testing appointment for an AV fistula revision.  She did undergo a CT scan scan without contrast which was negative for adverse pathology including no stones or obvious tumors or hydronephrosis. It did show bladder wall thickening concerning for cystitis. She was treated for possible cystitis during the admission to the hospital mentioned above.  She voids twice a day for approximately 10-12 ounces total per day. She receives dialysis every Monday Wednesday and Friday. She does normally take aspirin and Plavix, and those were stopped yesterday for upcoming surgery.   There were no vitals taken for this visit. NED. A&Ox3.   No respiratory distress   Abd soft, NT, ND Normal external genitalia with patent urethral meatus  Cystoscopy Procedure Note  Patient identification was confirmed, informed consent was obtained, and patient was prepped using Betadine solution.  Lidocaine jelly was administered per urethral meatus.    Preoperative abx where received prior to procedure.    Procedure: - Flexible cystoscope introduced, without any difficulty.   - Thorough search of the bladder revealed:    normal urethral meatus    normal urothelium - visualization somewhat limited due to cloudy concentrated urine however on close systematic sweeping of the bladder wall there was no tumors noted.    no stones    no ulcers     no tumors    no urethral polyps    no trabeculation  - Ureteral orifices were normal in position and appearance.  Post-Procedure: - Patient tolerated the procedure  well  Assessment/ Plan:  1. Gross hematuria Likely related to suspected UTI and signs of cystitis on CT at time of diagnosis. The patient will contact us if this recurs.  Nickie Retort, MD

## 2016-11-28 NOTE — Addendum Note (Signed)
Addended by: Kyra Manges on: 11/28/2016 02:35 PM   Modules accepted: Orders

## 2016-11-29 ENCOUNTER — Ambulatory Visit (INDEPENDENT_AMBULATORY_CARE_PROVIDER_SITE_OTHER): Payer: Medicare Other | Admitting: Vascular Surgery

## 2016-11-29 ENCOUNTER — Encounter (INDEPENDENT_AMBULATORY_CARE_PROVIDER_SITE_OTHER): Payer: Self-pay

## 2016-11-29 ENCOUNTER — Encounter (INDEPENDENT_AMBULATORY_CARE_PROVIDER_SITE_OTHER): Payer: Self-pay | Admitting: Vascular Surgery

## 2016-11-29 VITALS — BP 120/60 | HR 61 | Resp 15 | Ht 65.0 in | Wt 121.0 lb

## 2016-11-29 DIAGNOSIS — I10 Essential (primary) hypertension: Secondary | ICD-10-CM

## 2016-11-29 DIAGNOSIS — N186 End stage renal disease: Secondary | ICD-10-CM | POA: Diagnosis not present

## 2016-11-29 DIAGNOSIS — Z794 Long term (current) use of insulin: Secondary | ICD-10-CM

## 2016-11-29 DIAGNOSIS — E118 Type 2 diabetes mellitus with unspecified complications: Secondary | ICD-10-CM | POA: Diagnosis not present

## 2016-11-29 DIAGNOSIS — Z992 Dependence on renal dialysis: Secondary | ICD-10-CM

## 2016-11-29 NOTE — Assessment & Plan Note (Signed)
blood pressure control important in reducing the progression of atherosclerotic disease. On appropriate oral medications.  

## 2016-11-29 NOTE — Assessment & Plan Note (Signed)
The patient is highly symptomatic from her steal syndrome and at this point something will need to be done more definitive to try to address her flow. I think it is reasonable to attempt banding of her AV graft, but we have to know that with grafts is much more likely they will thrombose with a banding. It is also possible that she continues to have steal symptoms with banding and we have to go in and subsequently ligated the graft. I have discussed the procedure, the risks, and the benefits. She still has her catheter in place and if the graft thrombosis, she will need to use her catheter until we find a new access. The patient voices her understanding and is agreeable to proceed.

## 2016-11-29 NOTE — Patient Instructions (Signed)
Dialysis Dialysis is a procedure that replaces some of the work healthy kidneys do. It is done when you lose about 85-90% of your kidney function. It may also be done earlier if your symptoms may be improved by dialysis. During dialysis, wastes, salt, and extra water are removed from the blood, and the levels of certain chemicals in the blood (such as potassium) are maintained. Dialysis is done in sessions. Dialysis sessions are continued until the kidneys get better. If the kidneys cannot get better, such as in end-stage kidney disease, dialysis is continued for life or until you receive a new kidney (kidney transplant). There are two types of dialysis: hemodialysis and peritoneal dialysis. What is hemodialysis? Hemodialysis is a type of dialysis in which a machine called a dialyzer is used to filter the blood. Before beginning hemodialysis, you will have surgery to create a site where blood can be removed from the body and returned to the body (vascular access). There are three types of vascular accesses:  Arteriovenous fistula. To create this type of access, an artery is connected to a vein (usually in the arm). A fistula takes 1-6 months to develop after surgery. If it develops properly, it usually lasts longer than the other types of vascular accesses. It is also less likely to become infected and cause blood clots.  Arteriovenous graft. To create this type of access, an artery and a vein in the arm are connected with a tube. A graft may be used within 2-3 weeks of surgery.  A venous catheter. To create this type of access, a thin, flexible tube (catheter) is placed in a large vein in your neck, chest, or groin. A catheter may be used right away. It is usually used as a temporary access when dialysis needs to begin immediately.  During hemodialysis, blood leaves the body through your access. It travels through a tube to the dialyzer, where it is filtered. The blood then returns to your body through  another tube. Hemodialysis is usually performed by a health care provider at a hospital or dialysis center three times a week. Visits last about 3-4 hours. It may also be performed with the help of another person at home with training. What is peritoneal dialysis? Peritoneal dialysis is a type of dialysis in which the thin lining of the abdomen (peritoneum) is used as a filter. Before beginning peritoneal dialysis, you will have surgery to place a catheter in your abdomen. The catheter will be used to transfer a fluid called dialysate to and from your abdomen. At the start of a session, your abdomen is filled with dialysate. During the session, wastes, salt, and extra water in the blood pass through the peritoneum and into the dialysate. The dialysate is drained from the body at the end of the session. The process of filling and draining the dialysate is called an exchange. Exchanges are repeated until you have used up all the dialysate for the day. Peritoneal dialysis may be performed by you at home or at almost any other location. It is done every day. You may need up to five exchanges a day. The amount of time the dialysate is in your body between exchanges is called a dwell. The dwell depends on the number of exchanges needed and the characteristics of the peritoneum. It usually varies from 1.5-3 hours. You may go about your day normally between exchanges. Alternately, the exchanges may be done at night while you sleep, using a machine called a cycler. Which type of  dialysis should I choose? Both hemodialysis and peritoneal dialysis have advantages and disadvantages. Talk to your health care provider about which type of dialysis would be best for you. Your lifestyle and preferences should be considered along with your medical condition. In some cases, only one type of dialysis may be an option. Advantages of hemodialysis  It is done less often than peritoneal dialysis.  Someone else can do the  dialysis for you.  If you go to a dialysis center, your health care provider will be able to recognize any problems right away.  If you go to a dialysis center, you can interact with others who are having dialysis. This can provide you with emotional support.  Disadvantages of hemodialysis  Hemodialysis may cause cramps and low blood pressure. It may leave you feeling tired on the days you have the treatment.  If you go to a dialysis center, you will need to make weekly appointments and work around the center's schedule.  You will need to take extra care when traveling. If you go to a dialysis center, you will need to make special arrangements to visit a dialysis center near your destination. If you are having treatments at home, you will need to take the dialyzer with you to your destination.  You will need to avoid more foods than you would need to avoid on peritoneal dialysis.  Advantages of peritoneal dialysis  It is less likely than hemodialysis to cause cramps and low blood pressure.  You may do exchanges on your own wherever you are, including when you travel.  You do not need to avoid as many foods as you do on hemodialysis.  Disadvantages of peritoneal dialysis  It is done more often than hemodialysis.  Performing peritoneal dialysis requires you to have dexterity of the hands. You must also be able to lift bags.  You will have to learn sterilization techniques. You will need to practice them every day to reduce the risk of infection.  What changes will I need to make to my diet during dialysis? Both hemodialysis and peritoneal dialysis require you to make some changes to your diet. For example, you will need to limit your intake of foods high in the minerals phosphorus and potassium. You will also need to limit your fluid intake. Your dietitian can help you plan meals. A good meal plan can improve your dialysis and your health. What should I expect when beginning  dialysis? Adjusting to the dialysis treatment, schedule, and diet can take some time. You may need to stop working and may not be able to do some of the things you normally do. You may feel anxious or depressed when beginning dialysis. Eventually, many people feel better overall because of dialysis. Some people are able to return to work after making some changes, such as reducing work intensity. Where can I find more information?  [employer]: www.kidney.org  American Association of Kidney Patients: BombTimer.gl  [employer]: www.kidneyfund.org This information is not intended to replace advice given to you by your health care provider. Make sure you discuss any questions you have with your health care provider. Document Released: 06/29/2002 Document Revised: 09/14/2015 Document Reviewed: 06/02/2012 Elsevier Interactive Patient Education  2017 [employer].

## 2016-11-29 NOTE — Progress Notes (Signed)
MRN : [id number]  Stephanie Ellis is a 66 y.o. (09-08-1950) female who presents with chief complaint of  Chief Complaint  Patient presents with  . Follow-up    Discuss surgery  .  History of Present Illness: Patient returns today in follow up of steal syndrome of her left hand. She had a left forearm loop AV graft placed about 6 months ago. She has undergone 2 left upper extremity angiograms with intervention for steal syndrome but continues to have debilitating pain in the left hand. She does not have any ulcerations and her neurologic function appears to be intact. She says the pain is debilitating and she cannot stand. Her last intervention was about 3 months ago. The graft itself has been working well for her dialysis and the dialysis center actually wanted to remove her catheter, but she said she needed treatment for the steal syndrome.  Current Outpatient Prescriptions  Medication Sig Dispense Refill  . aspirin EC 81 MG tablet Take 1 tablet (81 mg total) by mouth daily.    Marland Kitchen atorvastatin (LIPITOR) 40 MG tablet Take 40 mg by mouth daily at 6 PM.     . ATROPINE SULFATE-NACL OP Apply 1 drop to eye 2 (two) times daily. Left eye only    . cefUROXime (CEFTIN) 500 MG tablet Take 1 tablet (500 mg total) by mouth 2 (two) times daily with a meal. 12 tablet 0  . cholecalciferol (VITAMIN D) 400 units TABS tablet Take 400 Units by mouth daily.     . clopidogrel (PLAVIX) 75 MG tablet Take 1 tablet (75 mg total) by mouth daily. dont take for next five day 30 tablet 0  . Difluprednate (DUREZOL) 0.05 % EMUL Apply 1 drop to eye 4 (four) times daily.    . famotidine (PEPCID) 20 MG tablet Take 1 tablet (20 mg total) by mouth 2 (two) times daily. (Patient not taking: Reported on 11/28/2016) 60 tablet 0  . insulin glargine (LANTUS) 100 unit/mL SOPN Inject 14 Units into the skin every evening.     . isosorbide mononitrate (IMDUR) 30 MG 24 hr tablet Take 1 tablet (30 mg total) by mouth daily. 30 tablet 0  .  lisinopril (PRINIVIL,ZESTRIL) 10 MG tablet Take 10 mg by mouth daily.    . metoCLOPramide (REGLAN) 10 MG tablet Take 1 tablet (10 mg total) by mouth every 6 (six) hours as needed for nausea, vomiting or refractory nausea / vomiting. 30 tablet 0  . metoprolol tartrate (LOPRESSOR) 25 MG tablet 1/2 tablet po twice a day (Patient taking differently: Take 12.5 mg by mouth 2 (two) times daily. 1/2 tablet po twice a day) 30 tablet 0  . Multiple Vitamin (MULTIVITAMIN WITH MINERALS) TABS tablet Take 2 tablets by mouth daily. Gummy vitamins    . nitroGLYCERIN (NITROSTAT) 0.4 MG SL tablet Place 1 tablet (0.4 mg total) under the tongue every 5 (five) minutes as needed for chest pain. 30 tablet 0  . sevelamer carbonate (RENVELA) 800 MG tablet Take 800 mg by mouth 3 (three) times daily with meals.     No current facility-administered medications for this visit.     Past Medical History:  Diagnosis Date  . Anemia   . Anorexia   . Bacteremia due to Pseudomonas   . CHF (congestive heart failure) ([place])   . Complication of anesthesia    WITH LAST STENT 10/17. PASED OUT AND HAD TO BE AWAKENED  . Coronary artery disease involving left main coronary artery 01/2015   [facility]:  70% LM, p-mLAD 50-60% (Resting FFR 0.75), mRCA 80-90%, ~40 Ost OM & D1  . Dysrhythmia   . ESRD (end stage renal disease) on dialysis [facility])    ESRD secondary to acute kidney failure s/p CABG  DIALYSIS M/W/F  . Essential hypertension   . GERD (gastroesophageal reflux disease)   . Heart murmur   . Hypercholesterolemia   . Myocardial infarction ([place])   . 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 Marland Kitchen Haithcock: CABG x 3, LIMA to LAD, SVG to RCA, SVG to OM3, EVH  . Type II diabetes mellitus with complication (HCC)    CAD    Past Surgical History:  Procedure Laterality Date  . ABDOMINAL HYSTERECTOMY    . AV FISTULA PLACEMENT Left 05/30/2016   Procedure: ARTERIOVENOUS graft;  Surgeon: Algernon Huxley, MD;  Location: [facility] ORS;  Service: Vascular;   Laterality: Left;  . CARDIAC CATHETERIZATION  01/2015   [facility]: Ost LM 70%, p-m LAD 50-60% (Rest FFR + @ 0.75), mRCA 80-90%, ostD1 40%, pOM1 40%  . CATARACT EXTRACTION W/PHACO Left 01/18/2016   Procedure: CATARACT EXTRACTION PHACO AND INTRAOCULAR LENS PLACEMENT (IOC);  Surgeon: Eulogio Bear, MD;  Location: [facility] ORS;  Service: Ophthalmology;  Laterality: Left;  [place] 1.05 AP% 15.5 CDE 10.16 Fluid Pack Lot # [id number]  . CATARACT EXTRACTION W/PHACO Right 08/01/2016   Procedure: CATARACT EXTRACTION PHACO AND INTRAOCULAR LENS PLACEMENT (IOC);  Surgeon: Eulogio Bear, MD;  Location: [facility] ORS;  Service: Ophthalmology;  Laterality: Right;  [place] 01:00.6 AP% 11.4 CDE 6.93  LOT # [id number]  . COLONOSCOPY    . CORONARY ANGIOPLASTY     SENTS 02/12/16  . CORONARY ARTERY BYPASS GRAFT  03/28/15    [facility]: Dr. Waldemar Dickens: LIMA to LAD, SVG to RCA, SVG to OM3, EVH  . ESOPHAGOGASTRODUODENOSCOPY (EGD) WITH PROPOFOL N/A 10/24/2016   Procedure: ESOPHAGOGASTRODUODENOSCOPY (EGD) WITH PROPOFOL;  Surgeon: [name], MD;  Location: [facility] ENDOSCOPY;  Service: Endoscopy;  Laterality: N/A;  . EYE SURGERY    . INSERTION EXPRESS TUBE SHUNT Right 08/01/2016   Procedure: INSERTION EXPRESS TUBE SHUNT;  Surgeon: Eulogio Bear, MD;  Location: [facility] ORS;  Service: Ophthalmology;  Laterality: Right;  . INSERTION OF AHMED VALVE Left 08/15/2016   Procedure: INSERTION OF AHMED VALVE;  Surgeon: Eulogio Bear, MD;  Location: [facility] ORS;  Service: Ophthalmology;  Laterality: Left;  . INSERTION OF DIALYSIS CATHETER    . TRANSTHORACIC ECHOCARDIOGRAM  January 2017    EF 60-65%. GR 2 DD. Mild degenerative mitral valve disease but no prolapse or regurgitation. Mild left atrial dilation. Mild to moderate LVH. Pericardial effusion gone  . UPPER EXTREMITY ANGIOGRAPHY Left 06/27/2016   Procedure: Upper Extremity Angiography;  Surgeon: Algernon Huxley, MD;  Location: [place] CV LAB;  Service: Cardiovascular;  Laterality: Left;  . UPPER  EXTREMITY ANGIOGRAPHY Left 08/22/2016   Procedure: Upper Extremity Angiography;  Surgeon: Algernon Huxley, MD;  Location: [place] CV LAB;  Service: Cardiovascular;  Laterality: Left;   Family History  Problem Relation Age of Onset  . Diabetes Mellitus II Mother    bleeding or clotting issues to their knowledge. No aneurysms or autoimmune diseases   Social History     Social History  Substance Use Topics  . Smoking status: Never Smoker  . Smokeless tobacco: Never Used  . Alcohol use No  No IVDU       Allergies  Allergen Reactions  . Chlorthalidone Anaphylaxis, Itching and Rash  . Ace  Inhibitors Other (See Comments)    Reaction:  Hyperkalemia   . Angiotensin Receptor Blockers Other (See Comments)    Reaction:  Hyperkalemia   . Norvasc [Amlodipine] Itching and Rash       REVIEW OF SYSTEMS (Negative unless checked)  Constitutional: []Weight loss  []Fever  []Chills Cardiac: []Chest pain   []Chest pressure   [x]Palpitations   []Shortness of breath when laying flat   []Shortness of breath at rest   [x]Shortness of breath with exertion. Vascular:  []Pain in legs with walking   []Pain in legs at rest   []Pain in legs when laying flat   []Claudication   []Pain in feet when walking  []Pain in feet at rest  []Pain in feet when laying flat   []History of DVT   []Phlebitis   [x]Swelling in legs   []Varicose veins   []Non-healing ulcers Pulmonary:   []Uses home oxygen   []Productive cough   []Hemoptysis   []Wheeze  []COPD   []Asthma Neurologic:  []Dizziness  []Blackouts   []Seizures   []History of stroke   []History of TIA  []Aphasia   []Temporary blindness   []Dysphagia   [x]Weakness or numbness in arms   []Weakness or numbness in legs Musculoskeletal:  []Arthritis   []Joint swelling   []Joint pain   []Low back pain Hematologic:  []Easy bruising  []Easy bleeding   []Hypercoagulable state   []Anemic  []Hepatitis Gastrointestinal:  []Blood in stool   []Vomiting blood   [x]Gastroesophageal reflux/heartburn   []Abdominal pain Genitourinary:  [x]Chronic kidney disease   []Difficult urination  []Frequent urination  []Burning with urination   []Hematuria Skin:  []Rashes   []Ulcers   []Wounds Psychological:  []History of anxiety   [] History of major depression.    Physical Examination  BP 120/60 (BP Location: Right Arm)   Pulse 61   Resp 15   Ht 5' 5" (1.651 m)   Wt 54.9 kg (121 lb)   BMI 20.14 kg/m  Gen:  WD/WN, NAD Head: [place]/AT, No temporalis wasting. Ear/Nose/Throat: Hearing grossly intact, nares w/o erythema or drainage, trachea midline Eyes: Conjunctiva clear. Sclera non-icteric Neck: Supple.  No JVD.  Pulmonary:  Good air movement, no use of accessory muscles.  Cardiac: RRR, normal S1, S2 Vascular: good thrill in left arm AVG Vessel Right Left  Radial Palpable 1+ Palpable                                    Musculoskeletal: M/S 5/5 throughout.  No deformity or atrophy. Neurologic: Sensation grossly intact in extremities.  Symmetrical.  Speech is fluent.  Psychiatric: Judgment intact, Mood & affect appropriate for pt's clinical situation. Dermatologic: No rashes or ulcers noted.  No cellulitis or open wounds.       Labs Recent Results (from the past 2160 hour(s))  CBC with Differential     Status: Abnormal   Collection Time: 09/04/16  3:25 PM  Result Value Ref Range   WBC 7.4 3.6 - 11.0 K/uL   RBC 4.24 3.80 - 5.20 MIL/uL   Hemoglobin 12.7 12.0 - 16.0 g/dL   HCT 39.2 35.0 - 47.0 %   MCV 92.6 80.0 - 100.0 fL   MCH 30.1 26.0 - 34.0 pg   MCHC 32.5 32.0 - 36.0 g/dL   RDW 20.6 (H) 11.5 - 14.5 %   Platelets 197 150 - 440 K/uL   Neutrophils Relative % 71 %  Neutro Abs 5.2 1.4 - 6.5 K/uL   Lymphocytes Relative 18 %   Lymphs Abs 1.3 1.0 - 3.6 K/uL   Monocytes Relative 9 %   Monocytes Absolute 0.6 0.2 - 0.9 K/uL   Eosinophils Relative 2 %   Eosinophils Absolute 0.1 0 - 0.7 K/uL   Basophils Relative 0 %   Basophils Absolute  0.0 0 - 0.1 K/uL  Basic metabolic panel     Status: Abnormal   Collection Time: 09/04/16  3:25 PM  Result Value Ref Range   Sodium 135 135 - 145 mmol/L   Potassium 3.7 3.5 - 5.1 mmol/L   Chloride 94 (L) 101 - 111 mmol/L   CO2 29 22 - 32 mmol/L   Glucose, Bld 131 (H) 65 - 99 mg/dL   BUN 21 (H) 6 - 20 mg/dL   Creatinine, Ser 2.89 (H) 0.44 - 1.00 mg/dL   Calcium 9.8 8.9 - 10.3 mg/dL   GFR calc non Af Amer 16 (L) >60 mL/min   GFR calc Af Amer 19 (L) >60 mL/min    Comment: (NOTE) The eGFR has been calculated using the CKD EPI equation. This calculation has not been validated in all clinical situations. eGFR's persistently <60 mL/min signify possible Chronic Kidney Disease.    Anion gap 12 5 - 15  Troponin I     Status: Abnormal   Collection Time: 09/04/16  3:25 PM  Result Value Ref Range   Troponin I 0.06 (HH) <0.03 ng/mL    Comment: CRITICAL RESULT CALLED TO, READ BACK BY AND VERIFIED WITH VALERIE CHANDLER AT 5974 ON 09/04/2016 JJB   Brain natriuretic peptide     Status: Abnormal   Collection Time: 09/04/16  3:26 PM  Result Value Ref Range   B Natriuretic Peptide 4,123.0 (H) 0.0 - 100.0 pg/mL  MRSA PCR Screening     Status: None   Collection Time: 09/04/16  7:18 PM  Result Value Ref Range   MRSA by PCR NEGATIVE NEGATIVE    Comment:        The GeneXpert MRSA Assay (FDA approved for NASAL specimens only), is one component of a comprehensive MRSA colonization surveillance program. It is not intended to diagnose MRSA infection nor to guide or monitor treatment for MRSA infections.   Glucose, capillary     Status: Abnormal   Collection Time: 09/04/16  8:44 PM  Result Value Ref Range   Glucose-Capillary 142 (H) 65 - 99 mg/dL  Troponin I     Status: Abnormal   Collection Time: 09/04/16  9:31 PM  Result Value Ref Range   Troponin I 0.06 (HH) <0.03 ng/mL    Comment: CRITICAL VALUE NOTED. VALUE IS CONSISTENT WITH PREVIOUSLY REPORTED/CALLED VALUE JJB  Basic metabolic panel      Status: Abnormal   Collection Time: 09/05/16  3:44 AM  Result Value Ref Range   Sodium 135 135 - 145 mmol/L   Potassium 4.1 3.5 - 5.1 mmol/L   Chloride 96 (L) 101 - 111 mmol/L   CO2 31 22 - 32 mmol/L   Glucose, Bld 147 (H) 65 - 99 mg/dL   BUN 34 (H) 6 - 20 mg/dL   Creatinine, Ser 4.26 (H) 0.44 - 1.00 mg/dL   Calcium 9.2 8.9 - 10.3 mg/dL   GFR calc non Af Amer 10 (L) >60 mL/min   GFR calc Af Amer 12 (L) >60 mL/min    Comment: (NOTE) The eGFR has been calculated using the CKD EPI equation. This calculation has not  been validated in all clinical situations. eGFR's persistently <60 mL/min signify possible Chronic Kidney Disease.    Anion gap 8 5 - 15  CBC     Status: Abnormal   Collection Time: 09/05/16  3:44 AM  Result Value Ref Range   WBC 7.3 3.6 - 11.0 K/uL   RBC 3.68 (L) 3.80 - 5.20 MIL/uL   Hemoglobin 11.2 (L) 12.0 - 16.0 g/dL   HCT 34.2 (L) 35.0 - 47.0 %   MCV 93.1 80.0 - 100.0 fL   MCH 30.4 26.0 - 34.0 pg   MCHC 32.6 32.0 - 36.0 g/dL   RDW 20.4 (H) 11.5 - 14.5 %   Platelets 172 150 - 440 K/uL  Troponin I     Status: Abnormal   Collection Time: 09/05/16  3:44 AM  Result Value Ref Range   Troponin I 0.07 (HH) <0.03 ng/mL    Comment: CRITICAL VALUE NOTED. VALUE IS CONSISTENT WITH PREVIOUSLY REPORTED/CALLED VALUE BY CAF   Glucose, capillary     Status: Abnormal   Collection Time: 09/05/16  8:05 AM  Result Value Ref Range   Glucose-Capillary 116 (H) 65 - 99 mg/dL  Troponin I     Status: Abnormal   Collection Time: 09/05/16  9:00 AM  Result Value Ref Range   Troponin I 0.06 (HH) <0.03 ng/mL    Comment: CRITICAL VALUE NOTED. VALUE IS CONSISTENT WITH PREVIOUSLY REPORTED/CALLED VALUE DAS  Surgical pcr screen     Status: None   Collection Time: 10/17/16 10:09 AM  Result Value Ref Range   MRSA, PCR NEGATIVE NEGATIVE   Staphylococcus aureus NEGATIVE NEGATIVE    Comment:        The Xpert SA Assay (FDA approved for NASAL specimens in patients over 11 years of age), is  one component of a comprehensive surveillance program.  Test performance has been validated by [facility] for patients greater than or equal to 34 year old. It is not intended to diagnose infection nor to guide or monitor treatment.   APTT     Status: None   Collection Time: 10/17/16 10:09 AM  Result Value Ref Range   aPTT 33 24 - 36 seconds  Basic metabolic panel     Status: Abnormal   Collection Time: 10/17/16 10:09 AM  Result Value Ref Range   Sodium 137 135 - 145 mmol/L   Potassium 4.3 3.5 - 5.1 mmol/L   Chloride 97 (L) 101 - 111 mmol/L   CO2 29 22 - 32 mmol/L   Glucose, Bld 166 (H) 65 - 99 mg/dL   BUN 24 (H) 6 - 20 mg/dL   Creatinine, Ser 4.34 (H) 0.44 - 1.00 mg/dL   Calcium 9.9 8.9 - 10.3 mg/dL   GFR calc non Af Amer 10 (L) >60 mL/min   GFR calc Af Amer 11 (L) >60 mL/min    Comment: (NOTE) The eGFR has been calculated using the CKD EPI equation. This calculation has not been validated in all clinical situations. eGFR's persistently <60 mL/min signify possible Chronic Kidney Disease.    Anion gap 11 5 - 15  CBC WITH DIFFERENTIAL     Status: Abnormal   Collection Time: 10/17/16 10:09 AM  Result Value Ref Range   WBC 9.4 3.6 - 11.0 K/uL   RBC 4.42 3.80 - 5.20 MIL/uL   Hemoglobin 13.3 12.0 - 16.0 g/dL   HCT 41.3 35.0 - 47.0 %   MCV 93.3 80.0 - 100.0 fL   MCH 30.1 26.0 - 34.0  pg   MCHC 32.2 32.0 - 36.0 g/dL   RDW 20.1 (H) 11.5 - 14.5 %   Platelets 174 150 - 440 K/uL   Neutrophils Relative % 85 %   Neutro Abs 7.9 (H) 1.4 - 6.5 K/uL   Lymphocytes Relative 9 %   Lymphs Abs 0.8 (L) 1.0 - 3.6 K/uL   Monocytes Relative 5 %   Monocytes Absolute 0.5 0.2 - 0.9 K/uL   Eosinophils Relative 1 %   Eosinophils Absolute 0.1 0 - 0.7 K/uL   Basophils Relative 0 %   Basophils Absolute 0.0 0 - 0.1 K/uL  Protime-INR     Status: None   Collection Time: 10/17/16 10:09 AM  Result Value Ref Range   Prothrombin Time 13.5 11.4 - 15.2 seconds   INR 1.03   Type and screen      Status: None   Collection Time: 10/17/16 10:09 AM  Result Value Ref Range   ABO/RH(D) B POS    Antibody Screen NEG    Sample Expiration 10/31/2016    Extend sample reason NO TRANSFUSIONS OR PREGNANCY IN THE PAST 3 MONTHS   Lipase, blood     Status: None   Collection Time: 10/17/16 10:09 AM  Result Value Ref Range   Lipase 37 11 - 51 U/L  Hepatic function panel     Status: Abnormal   Collection Time: 10/17/16 10:09 AM  Result Value Ref Range   Total Protein 8.2 (H) 6.5 - 8.1 g/dL   Albumin 4.0 3.5 - 5.0 g/dL   AST 35 15 - 41 U/L   ALT 56 (H) 14 - 54 U/L   Alkaline Phosphatase 149 (H) 38 - 126 U/L   Total Bilirubin 0.6 0.3 - 1.2 mg/dL   Bilirubin, Direct <0.1 (L) 0.1 - 0.5 mg/dL   Indirect Bilirubin NOT CALCULATED 0.3 - 0.9 mg/dL  Urine culture     Status: Abnormal   Collection Time: 10/17/16  4:34 PM  Result Value Ref Range   Specimen Description URINE, RANDOM    Special Requests Normal    Culture MULTIPLE SPECIES PRESENT, SUGGEST RECOLLECTION (A)    Report Status 10/19/2016 FINAL   Urinalysis, Complete w Microscopic     Status: Abnormal   Collection Time: 10/17/16  4:43 PM  Result Value Ref Range   Color, Urine RED (A) YELLOW   APPearance TURBID (A) CLEAR   Specific Gravity, Urine 1.014 1.005 - 1.030   pH  5.0 - 8.0    TEST NOT REPORTED DUE TO COLOR INTERFERENCE OF URINE PIGMENT   Glucose, UA (A) NEGATIVE mg/dL    TEST NOT REPORTED DUE TO COLOR INTERFERENCE OF URINE PIGMENT   Hgb urine dipstick (A) NEGATIVE    TEST NOT REPORTED DUE TO COLOR INTERFERENCE OF URINE PIGMENT   Bilirubin Urine (A) NEGATIVE    TEST NOT REPORTED DUE TO COLOR INTERFERENCE OF URINE PIGMENT   Ketones, ur (A) NEGATIVE mg/dL    TEST NOT REPORTED DUE TO COLOR INTERFERENCE OF URINE PIGMENT   Protein, ur (A) NEGATIVE mg/dL    TEST NOT REPORTED DUE TO COLOR INTERFERENCE OF URINE PIGMENT   Nitrite (A) NEGATIVE    TEST NOT REPORTED DUE TO COLOR INTERFERENCE OF URINE PIGMENT   Leukocytes, UA (A) NEGATIVE     TEST NOT REPORTED DUE TO COLOR INTERFERENCE OF URINE PIGMENT   RBC / HPF TOO NUMEROUS TO COUNT 0 - 5 RBC/hpf   WBC, UA TOO NUMEROUS TO COUNT 0 - 5 WBC/hpf  Bacteria, UA MANY (A) NONE SEEN   Squamous Epithelial / LPF NONE SEEN NONE SEEN   WBC Clumps PRESENT   Glucose, capillary     Status: Abnormal   Collection Time: 10/17/16  9:57 PM  Result Value Ref Range   Glucose-Capillary 204 (H) 65 - 99 mg/dL  HIV antibody (Routine Testing)     Status: None   Collection Time: 10/18/16  3:21 AM  Result Value Ref Range   HIV Screen 4th Generation wRfx Non Reactive Non Reactive    Comment: (NOTE) Performed At: [facility] [place], [place] [id number] Lindon Romp MD [id number]   Basic metabolic panel     Status: Abnormal   Collection Time: 10/18/16  3:21 AM  Result Value Ref Range   Sodium 137 135 - 145 mmol/L   Potassium 3.8 3.5 - 5.1 mmol/L   Chloride 100 (L) 101 - 111 mmol/L   CO2 29 22 - 32 mmol/L   Glucose, Bld 81 65 - 99 mg/dL   BUN 32 (H) 6 - 20 mg/dL   Creatinine, Ser 4.75 (H) 0.44 - 1.00 mg/dL   Calcium 9.2 8.9 - 10.3 mg/dL   GFR calc non Af Amer 9 (L) >60 mL/min   GFR calc Af Amer 10 (L) >60 mL/min    Comment: (NOTE) The eGFR has been calculated using the CKD EPI equation. This calculation has not been validated in all clinical situations. eGFR's persistently <60 mL/min signify possible Chronic Kidney Disease.    Anion gap 8 5 - 15  CBC     Status: Abnormal   Collection Time: 10/18/16  3:21 AM  Result Value Ref Range   WBC 7.1 3.6 - 11.0 K/uL   RBC 3.73 (L) 3.80 - 5.20 MIL/uL   Hemoglobin 11.0 (L) 12.0 - 16.0 g/dL   HCT 34.1 (L) 35.0 - 47.0 %   MCV 91.4 80.0 - 100.0 fL   MCH 29.5 26.0 - 34.0 pg   MCHC 32.3 32.0 - 36.0 g/dL   RDW 20.4 (H) 11.5 - 14.5 %   Platelets 168 150 - 440 K/uL  Glucose, capillary     Status: None   Collection Time: 10/18/16  7:45 AM  Result Value Ref Range   Glucose-Capillary 69 65 - 99 mg/dL   Comment 1  Notify RN   Glucose, capillary     Status: Abnormal   Collection Time: 10/18/16 11:41 AM  Result Value Ref Range   Glucose-Capillary 114 (H) 65 - 99 mg/dL   Comment 1 Notify RN   Phosphorus     Status: Abnormal   Collection Time: 10/18/16  4:00 PM  Result Value Ref Range   Phosphorus 2.0 (L) 2.5 - 4.6 mg/dL  Parathyroid hormone, intact (no Ca)     Status: Abnormal   Collection Time: 10/18/16  4:00 PM  Result Value Ref Range   PTH 103 (H) 15 - 65 pg/mL    Comment: (NOTE) Performed At: [facility] [place], [place] [id number] Lindon Romp MD [id number]   Glucose, capillary     Status: Abnormal   Collection Time: 10/18/16  9:21 PM  Result Value Ref Range   Glucose-Capillary 137 (H) 65 - 99 mg/dL  CBC     Status: Abnormal   Collection Time: 10/19/16  3:09 AM  Result Value Ref Range   WBC 6.6 3.6 - 11.0 K/uL   RBC 4.00 3.80 - 5.20 MIL/uL   Hemoglobin 12.0 12.0 - 16.0 g/dL  HCT 36.4 35.0 - 47.0 %   MCV 91.1 80.0 - 100.0 fL   MCH 30.0 26.0 - 34.0 pg   MCHC 33.0 32.0 - 36.0 g/dL   RDW 19.7 (H) 11.5 - 14.5 %   Platelets 160 150 - 440 K/uL  Basic metabolic panel     Status: Abnormal   Collection Time: 10/19/16  3:09 AM  Result Value Ref Range   Sodium 137 135 - 145 mmol/L   Potassium 3.4 (L) 3.5 - 5.1 mmol/L   Chloride 98 (L) 101 - 111 mmol/L   CO2 33 (H) 22 - 32 mmol/L   Glucose, Bld 164 (H) 65 - 99 mg/dL   BUN 23 (H) 6 - 20 mg/dL   Creatinine, Ser 4.62 (H) 0.44 - 1.00 mg/dL   Calcium 8.7 (L) 8.9 - 10.3 mg/dL   GFR calc non Af Amer 9 (L) >60 mL/min   GFR calc Af Amer 11 (L) >60 mL/min    Comment: (NOTE) The eGFR has been calculated using the CKD EPI equation. This calculation has not been validated in all clinical situations. eGFR's persistently <60 mL/min signify possible Chronic Kidney Disease.    Anion gap 6 5 - 15  Glucose, capillary     Status: Abnormal   Collection Time: 10/19/16  7:51 AM  Result Value Ref Range   Glucose-Capillary  161 (H) 65 - 99 mg/dL  Comprehensive metabolic panel     Status: Abnormal   Collection Time: 10/24/16  8:46 AM  Result Value Ref Range   Sodium 139 135 - 145 mmol/L   Potassium 4.0 3.5 - 5.1 mmol/L   Chloride 100 (L) 101 - 111 mmol/L   CO2 26 22 - 32 mmol/L   Glucose, Bld 197 (H) 65 - 99 mg/dL   BUN 48 (H) 6 - 20 mg/dL   Creatinine, Ser 6.05 (H) 0.44 - 1.00 mg/dL   Calcium 9.5 8.9 - 10.3 mg/dL   Total Protein 7.9 6.5 - 8.1 g/dL   Albumin 4.1 3.5 - 5.0 g/dL   AST 27 15 - 41 U/L   ALT 22 14 - 54 U/L   Alkaline Phosphatase 94 38 - 126 U/L   Total Bilirubin 0.9 0.3 - 1.2 mg/dL   GFR calc non Af Amer 7 (L) >60 mL/min   GFR calc Af Amer 8 (L) >60 mL/min    Comment: (NOTE) The eGFR has been calculated using the CKD EPI equation. This calculation has not been validated in all clinical situations. eGFR's persistently <60 mL/min signify possible Chronic Kidney Disease.    Anion gap 13 5 - 15  Troponin I     Status: Abnormal   Collection Time: 10/24/16  8:46 AM  Result Value Ref Range   Troponin I 0.06 (HH) <0.03 ng/mL    Comment: CRITICAL RESULT CALLED TO, READ BACK BY AND VERIFIED WITH LAURA CATES AT 0932 ON 10/24/16 ALV   CBC with Differential     Status: Abnormal   Collection Time: 10/24/16  8:46 AM  Result Value Ref Range   WBC 8.1 3.6 - 11.0 K/uL   RBC 4.31 3.80 - 5.20 MIL/uL   Hemoglobin 12.7 12.0 - 16.0 g/dL   HCT 39.1 35.0 - 47.0 %   MCV 90.9 80.0 - 100.0 fL   MCH 29.5 26.0 - 34.0 pg   MCHC 32.4 32.0 - 36.0 g/dL   RDW 19.4 (H) 11.5 - 14.5 %   Platelets 178 150 - 440 K/uL   Neutrophils Relative %  85 %   Neutro Abs 6.8 (H) 1.4 - 6.5 K/uL   Lymphocytes Relative 9 %   Lymphs Abs 0.8 (L) 1.0 - 3.6 K/uL   Monocytes Relative 5 %   Monocytes Absolute 0.4 0.2 - 0.9 K/uL   Eosinophils Relative 1 %   Eosinophils Absolute 0.1 0 - 0.7 K/uL   Basophils Relative 0 %   Basophils Absolute 0.0 0 - 0.1 K/uL  Hemoglobin A1c     Status: Abnormal   Collection Time: 10/24/16  8:46 AM    Result Value Ref Range   Hgb A1c MFr Bld 6.9 (H) 4.8 - 5.6 %    Comment: (NOTE)         Pre-diabetes: 5.7 - 6.4         Diabetes: >6.4         Glycemic control for adults with diabetes: <7.0    Mean Plasma Glucose 151 mg/dL    Comment: (NOTE) Performed At: [facility] [place], [place] [id number] Lindon Romp MD [id number]   Lipase, blood     Status: None   Collection Time: 10/24/16  8:46 AM  Result Value Ref Range   Lipase 25 11 - 51 U/L  Glucose, capillary     Status: Abnormal   Collection Time: 10/24/16  1:45 PM  Result Value Ref Range   Glucose-Capillary 206 (H) 65 - 99 mg/dL   Comment 1 Notify RN   Glucose, capillary     Status: Abnormal   Collection Time: 10/24/16  4:37 PM  Result Value Ref Range   Glucose-Capillary 186 (H) 65 - 99 mg/dL  Glucose, capillary     Status: Abnormal   Collection Time: 10/24/16  9:54 PM  Result Value Ref Range   Glucose-Capillary 182 (H) 65 - 99 mg/dL   Comment 1 Notify RN   Basic metabolic panel     Status: Abnormal   Collection Time: 10/25/16  5:09 AM  Result Value Ref Range   Sodium 136 135 - 145 mmol/L   Potassium 4.3 3.5 - 5.1 mmol/L   Chloride 100 (L) 101 - 111 mmol/L   CO2 25 22 - 32 mmol/L   Glucose, Bld 109 (H) 65 - 99 mg/dL   BUN 57 (H) 6 - 20 mg/dL   Creatinine, Ser 6.77 (H) 0.44 - 1.00 mg/dL   Calcium 8.8 (L) 8.9 - 10.3 mg/dL   GFR calc non Af Amer 6 (L) >60 mL/min   GFR calc Af Amer 7 (L) >60 mL/min    Comment: (NOTE) The eGFR has been calculated using the CKD EPI equation. This calculation has not been validated in all clinical situations. eGFR's persistently <60 mL/min signify possible Chronic Kidney Disease.    Anion gap 11 5 - 15  CBC     Status: Abnormal   Collection Time: 10/25/16  5:09 AM  Result Value Ref Range   WBC 5.7 3.6 - 11.0 K/uL   RBC 3.93 3.80 - 5.20 MIL/uL   Hemoglobin 11.9 (L) 12.0 - 16.0 g/dL   HCT 36.4 35.0 - 47.0 %   MCV 92.4 80.0 - 100.0 fL   MCH 30.2 26.0 -  34.0 pg   MCHC 32.7 32.0 - 36.0 g/dL   RDW 19.9 (H) 11.5 - 14.5 %   Platelets 149 (L) 150 - 440 K/uL  Glucose, capillary     Status: Abnormal   Collection Time: 10/25/16  7:31 AM  Result Value Ref Range   Glucose-Capillary 117 (H)  65 - 99 mg/dL  Troponin I (q 6hr x 3)     Status: Abnormal   Collection Time: 10/25/16 10:22 AM  Result Value Ref Range   Troponin I 0.08 (HH) <0.03 ng/mL    Comment: CRITICAL VALUE NOTED. VALUE IS CONSISTENT WITH PREVIOUSLY REPORTED/CALLED VALUE  SDR   Glucose, capillary     Status: Abnormal   Collection Time: 10/25/16 11:47 AM  Result Value Ref Range   Glucose-Capillary 157 (H) 65 - 99 mg/dL    Radiology No results found.    Assessment/Plan  Hypertension blood pressure control important in reducing the progression of atherosclerotic disease. On appropriate oral medications.   Type II diabetes mellitus with complication (HCC) blood glucose control important in reducing the progression of atherosclerotic disease. Also, involved in wound healing. On appropriate medications.   ESRD (end stage renal disease) on dialysis [facility]) The patient is highly symptomatic from her steal syndrome and at this point something will need to be done more definitive to try to address her flow. I think it is reasonable to attempt banding of her AV graft, but we have to know that with grafts is much more likely they will thrombose with a banding. It is also possible that she continues to have steal symptoms with banding and we have to go in and subsequently ligated the graft. I have discussed the procedure, the risks, and the benefits. She still has her catheter in place and if the graft thrombosis, she will need to use her catheter until we find a new access. The patient voices her understanding and is agreeable to proceed.    Leotis Pain, MD  11/29/2016 10:44 AM    This note was created with Dragon medical transcription system.  Any errors from dictation are purely  unintentional

## 2016-11-29 NOTE — Assessment & Plan Note (Signed)
blood glucose control important in reducing the progression of atherosclerotic disease. Also, involved in wound healing. On appropriate medications.  

## 2016-12-03 ENCOUNTER — Other Ambulatory Visit
Admission: RE | Admit: 2016-12-03 | Discharge: 2016-12-03 | Disposition: A | Discharge status: Home / Self Care | Payer: Medicare Other | Source: Ambulatory Visit | Attending: Ophthalmology | Admitting: Ophthalmology

## 2016-12-03 DIAGNOSIS — H44002 Unspecified purulent endophthalmitis, left eye: Secondary | ICD-10-CM | POA: Insufficient documentation

## 2016-12-05 ENCOUNTER — Ambulatory Visit: Admit: 2016-12-05 | Payer: Medicare Other | Admitting: Vascular Surgery

## 2016-12-05 DIAGNOSIS — H44002 Unspecified purulent endophthalmitis, left eye: Secondary | ICD-10-CM | POA: Insufficient documentation

## 2016-12-05 SURGERY — DIALYSIS/PERMA CATHETER REMOVAL
Anesthesia: LOCAL

## 2016-12-06 LAB — EYE CULTURE
Culture: NO GROWTH
Gram Stain: NONE SEEN

## 2016-12-09 ENCOUNTER — Other Ambulatory Visit (INDEPENDENT_AMBULATORY_CARE_PROVIDER_SITE_OTHER): Payer: Self-pay

## 2016-12-10 ENCOUNTER — Encounter
Admission: RE | Admit: 2016-12-10 | Discharge: 2016-12-10 | Disposition: A | Discharge status: Home / Self Care | Payer: Medicare Other | Source: Ambulatory Visit | Attending: Vascular Surgery | Admitting: Vascular Surgery

## 2016-12-10 DIAGNOSIS — Z01818 Encounter for other preprocedural examination: Secondary | ICD-10-CM | POA: Diagnosis not present

## 2016-12-10 DIAGNOSIS — Z7901 Long term (current) use of anticoagulants: Secondary | ICD-10-CM | POA: Insufficient documentation

## 2016-12-10 LAB — TYPE AND SCREEN
ABO/RH(D): B POS
Antibody Screen: NEGATIVE

## 2016-12-10 LAB — CBC
HCT: 34.2 % — ABNORMAL LOW (ref 35.0–47.0)
Hemoglobin: 11.5 g/dL — ABNORMAL LOW (ref 12.0–16.0)
MCH: 31.4 pg (ref 26.0–34.0)
MCHC: 33.5 g/dL (ref 32.0–36.0)
MCV: 93.9 fL (ref 80.0–100.0)
Platelets: 242 10*3/uL (ref 150–440)
RBC: 3.65 MIL/uL — ABNORMAL LOW (ref 3.80–5.20)
RDW: 20 % — ABNORMAL HIGH (ref 11.5–14.5)
WBC: 7.4 10*3/uL (ref 3.6–11.0)

## 2016-12-10 LAB — COMPREHENSIVE METABOLIC PANEL
ALT: 96 U/L — ABNORMAL HIGH (ref 14–54)
AST: 44 U/L — ABNORMAL HIGH (ref 15–41)
Albumin: 4 g/dL (ref 3.5–5.0)
Alkaline Phosphatase: 131 U/L — ABNORMAL HIGH (ref 38–126)
Anion gap: 11 (ref 5–15)
BUN: 38 mg/dL — ABNORMAL HIGH (ref 6–20)
CO2: 29 mmol/L (ref 22–32)
Calcium: 9.9 mg/dL (ref 8.9–10.3)
Chloride: 98 mmol/L — ABNORMAL LOW (ref 101–111)
Creatinine, Ser: 4.84 mg/dL — ABNORMAL HIGH (ref 0.44–1.00)
GFR calc Af Amer: 10 mL/min — ABNORMAL LOW (ref >60)
GFR calc non Af Amer: 9 mL/min — ABNORMAL LOW (ref >60)
Glucose, Bld: 268 mg/dL — ABNORMAL HIGH (ref 65–99)
Potassium: 4.3 mmol/L (ref 3.5–5.1)
Sodium: 138 mmol/L (ref 135–145)
Total Bilirubin: 0.5 mg/dL (ref 0.3–1.2)
Total Protein: 8.5 g/dL — ABNORMAL HIGH (ref 6.5–8.1)

## 2016-12-10 LAB — PROTIME-INR
INR: 1.08
Prothrombin Time: 14 seconds (ref 11.4–15.2)

## 2016-12-10 LAB — SURGICAL PCR SCREEN
MRSA, PCR: NEGATIVE
Staphylococcus aureus: NEGATIVE

## 2016-12-10 LAB — APTT: aPTT: 28 seconds (ref 24–36)

## 2016-12-10 NOTE — Patient Instructions (Addendum)
Your procedure is scheduled on: Thursday, December 19, 2016 Report to Same Day Surgery on the 2nd floor in the [facility]. To find out your arrival time, please call [phone] between 1PM - 3PM on: Wednesday, August 29th  REMEMBER: Instructions that are not followed completely may result in serious medical risk up to and including death; or upon the discretion of your surgeon and anesthesiologist your surgery may need to be rescheduled.  Do not eat food or drink liquids after midnight. No gum chewing or hard candies.  No Alcohol for 24 hours before or after surgery.  No Smoking for 24 hours prior to surgery.  Notify your doctor if there is any change in your medical condition (cold, fever, infection).  Do not wear jewelry, make-up, hairpins, clips or nail polish.  Do not wear lotions, powders, or perfumes.   Do not shave 48 hours prior to surgery.   Do not bring valuables to the hospital. [facility] is not responsible for any belongings or valuables.   TAKE THESE MEDICATIONS THE MORNING OF SURGERY WITH A SIP OF WATER:  1.  ISOSORBIDE 2.  METOPROLOL  Take 1/2 of usual insulin dose the night before surgery and none on the morning of surgery. (LANTUS 7 UNITS ON December 18, 2016)  CONTINUE TAKING THE ASPIRIN  STOP TAKING PLAVIX 7 DAYS PRIOR TO SURGERY (LAST DOSE OF PLAVIX ON Wednesday, December 11, 2016)  Stop Anti-inflammatories such as Advil, Aleve, Ibuprofen, Motrin, Naproxen, Naprosyn, Goodie powder, or aspirin products. (May take Tylenol if needed.)  NOW! Stop OVER THE COUNTER supplements until after surgery. (May continue Vitamin D and multivitamin.)  If you are being discharged the day of surgery, you will not be allowed to drive home. You will need someone to drive you home and stay with you that night.   If you are taking public transportation, you will need to have a responsible adult to with you.

## 2016-12-19 ENCOUNTER — Ambulatory Visit: Payer: Medicare Other | Admitting: Anesthesiology

## 2016-12-19 ENCOUNTER — Encounter: Payer: Self-pay | Admitting: *Deleted

## 2016-12-19 ENCOUNTER — Ambulatory Visit
Admission: RE | Admit: 2016-12-19 | Discharge: 2016-12-19 | Disposition: A | Discharge status: Home / Self Care | Payer: Medicare Other | Source: Ambulatory Visit | Attending: Vascular Surgery | Admitting: Vascular Surgery

## 2016-12-19 ENCOUNTER — Encounter
Admission: RE | Disposition: A | Discharge status: Home / Self Care | Payer: Self-pay | Source: Ambulatory Visit | Attending: Vascular Surgery

## 2016-12-19 DIAGNOSIS — G458 Other transient cerebral ischemic attacks and related syndromes: Secondary | ICD-10-CM | POA: Diagnosis not present

## 2016-12-19 DIAGNOSIS — I132 Hypertensive heart and chronic kidney disease with heart failure and with stage 5 chronic kidney disease, or end stage renal disease: Secondary | ICD-10-CM | POA: Diagnosis not present

## 2016-12-19 DIAGNOSIS — Z794 Long term (current) use of insulin: Secondary | ICD-10-CM | POA: Insufficient documentation

## 2016-12-19 DIAGNOSIS — T82898A Other specified complication of vascular prosthetic devices, implants and grafts, initial encounter: Secondary | ICD-10-CM | POA: Insufficient documentation

## 2016-12-19 DIAGNOSIS — I252 Old myocardial infarction: Secondary | ICD-10-CM | POA: Insufficient documentation

## 2016-12-19 DIAGNOSIS — K219 Gastro-esophageal reflux disease without esophagitis: Secondary | ICD-10-CM | POA: Insufficient documentation

## 2016-12-19 DIAGNOSIS — Y832 Surgical operation with anastomosis, bypass or graft as the cause of abnormal reaction of the patient, or of later complication, without mention of misadventure at the time of the procedure: Secondary | ICD-10-CM | POA: Insufficient documentation

## 2016-12-19 DIAGNOSIS — I509 Heart failure, unspecified: Secondary | ICD-10-CM | POA: Insufficient documentation

## 2016-12-19 DIAGNOSIS — I2510 Atherosclerotic heart disease of native coronary artery without angina pectoris: Secondary | ICD-10-CM | POA: Diagnosis not present

## 2016-12-19 DIAGNOSIS — E1122 Type 2 diabetes mellitus with diabetic chronic kidney disease: Secondary | ICD-10-CM | POA: Diagnosis not present

## 2016-12-19 DIAGNOSIS — N186 End stage renal disease: Secondary | ICD-10-CM | POA: Diagnosis not present

## 2016-12-19 LAB — POTASSIUM: Potassium: 4 mmol/L (ref 3.5–5.1)

## 2016-12-19 LAB — GLUCOSE, CAPILLARY
Glucose-Capillary: 79 mg/dL (ref 65–99)
Glucose-Capillary: 79 mg/dL (ref 65–99)
Glucose-Capillary: 64 mg/dL — ABNORMAL LOW (ref 65–99)

## 2016-12-19 SURGERY — BANDING HERO GRAFT
Anesthesia: Regional | Laterality: Left | Wound class: Clean

## 2016-12-19 MED ORDER — PROPOFOL 500 MG/50ML IV EMUL
INTRAVENOUS | Status: AC
  Filled 2016-12-19: qty 50

## 2016-12-19 MED ORDER — ROPIVACAINE HCL 5 MG/ML IJ SOLN
INTRAMUSCULAR | Status: AC
  Filled 2016-12-19: qty 30

## 2016-12-19 MED ORDER — FAMOTIDINE 20 MG PO TABS
ORAL_TABLET | ORAL | Status: AC
  Administered 2016-12-19: 20 mg via ORAL
  Filled 2016-12-19: qty 1

## 2016-12-19 MED ORDER — SODIUM CHLORIDE 0.9 % IV SOLN
INTRAVENOUS | Status: DC
  Administered 2016-12-19: 12:00:00 via INTRAVENOUS
  Administered 2016-12-19: 20 mL/h via INTRAVENOUS

## 2016-12-19 MED ORDER — BUPIVACAINE-EPINEPHRINE (PF) 0.5% -1:200000 IJ SOLN
INTRAMUSCULAR | Status: AC
  Filled 2016-12-19: qty 30

## 2016-12-19 MED ORDER — LIDOCAINE HCL (PF) 1 % IJ SOLN
INTRAMUSCULAR | Status: DC | PRN
  Administered 2016-12-19: 1 mL via INTRADERMAL

## 2016-12-19 MED ORDER — METOPROLOL TARTRATE 25 MG PO TABS
25.0000 mg | ORAL_TABLET | Freq: Once | ORAL | Status: AC
  Administered 2016-12-19: 25 mg via ORAL

## 2016-12-19 MED ORDER — HEPARIN SODIUM (PORCINE) 5000 UNIT/ML IJ SOLN
INTRAMUSCULAR | Status: AC
  Filled 2016-12-19: qty 1

## 2016-12-19 MED ORDER — FAMOTIDINE 20 MG PO TABS
20.0000 mg | ORAL_TABLET | Freq: Once | ORAL | Status: AC
  Administered 2016-12-19: 20 mg via ORAL

## 2016-12-19 MED ORDER — CEFAZOLIN SODIUM-DEXTROSE 1-4 GM/50ML-% IV SOLN
INTRAVENOUS | Status: AC
  Filled 2016-12-19: qty 50

## 2016-12-19 MED ORDER — CEFAZOLIN SODIUM-DEXTROSE 1-4 GM/50ML-% IV SOLN
1.0000 g | Freq: Once | INTRAVENOUS | Status: AC
  Administered 2016-12-19: 1 g via INTRAVENOUS

## 2016-12-19 MED ORDER — METOPROLOL TARTRATE 25 MG PO TABS
ORAL_TABLET | ORAL | Status: AC
  Administered 2016-12-19: 25 mg via ORAL
  Filled 2016-12-19: qty 1

## 2016-12-19 MED ORDER — LIDOCAINE HCL (PF) 2 % IJ SOLN
INTRAMUSCULAR | Status: AC
  Filled 2016-12-19: qty 20

## 2016-12-19 MED ORDER — ONDANSETRON HCL 4 MG/2ML IJ SOLN
INTRAMUSCULAR | Status: AC
  Filled 2016-12-19: qty 2

## 2016-12-19 MED ORDER — LIDOCAINE HCL (PF) 1 % IJ SOLN
INTRAMUSCULAR | Status: AC
  Filled 2016-12-19: qty 5

## 2016-12-19 MED ORDER — LIDOCAINE HCL (PF) 2 % IJ SOLN
INTRAMUSCULAR | Status: AC
  Filled 2016-12-19: qty 2

## 2016-12-19 MED ORDER — ONDANSETRON HCL 4 MG/2ML IJ SOLN
INTRAMUSCULAR | Status: DC | PRN
  Administered 2016-12-19: 4 mg via INTRAVENOUS

## 2016-12-19 MED ORDER — PROPOFOL 10 MG/ML IV BOLUS
INTRAVENOUS | Status: DC | PRN
  Administered 2016-12-19: 20 mg via INTRAVENOUS

## 2016-12-19 MED ORDER — ROPIVACAINE HCL 5 MG/ML IJ SOLN
INTRAMUSCULAR | Status: DC | PRN
  Administered 2016-12-19: 7 mL via PERINEURAL
  Administered 2016-12-19: 13 mL via PERINEURAL

## 2016-12-19 MED ORDER — LIDOCAINE HCL (CARDIAC) 20 MG/ML IV SOLN
INTRAVENOUS | Status: DC | PRN
  Administered 2016-12-19: 20 mg via INTRAVENOUS

## 2016-12-19 MED ORDER — PROPOFOL 500 MG/50ML IV EMUL
INTRAVENOUS | Status: DC | PRN
  Administered 2016-12-19: 30 ug/kg/min via INTRAVENOUS

## 2016-12-19 MED ORDER — LIDOCAINE HCL (PF) 2 % IJ SOLN
INTRAMUSCULAR | Status: DC | PRN
  Administered 2016-12-19: 4 mL via PERINEURAL
  Administered 2016-12-19: 6 mL via PERINEURAL

## 2016-12-19 SURGICAL SUPPLY — 48 items
BAG DECANTER FOR FLEXI CONT (MISCELLANEOUS) ×2 IMPLANT
BLADE SURG SZ11 CARB STEEL (BLADE) ×2 IMPLANT
BOOT SUTURE AID YELLOW STND (SUTURE) ×2 IMPLANT
CANISTER SUCT 1200ML W/VALVE (MISCELLANEOUS) ×2 IMPLANT
CHLORAPREP W/TINT 26ML (MISCELLANEOUS) ×2 IMPLANT
CLIP SPRNG 6MM S-JAW DBL (CLIP) ×2
DERMABOND ADVANCED (GAUZE/BANDAGES/DRESSINGS) ×1
DERMABOND ADVANCED .7 DNX12 (GAUZE/BANDAGES/DRESSINGS) ×1 IMPLANT
ELECT CAUTERY BLADE 6.4 (BLADE) ×2 IMPLANT
ELECT REM PT RETURN 9FT ADLT (ELECTROSURGICAL) ×2
ELECTRODE REM PT RTRN 9FT ADLT (ELECTROSURGICAL) ×1 IMPLANT
GLOVE BIO SURGEON STRL SZ7 (GLOVE) ×4 IMPLANT
GLOVE INDICATOR 7.5 STRL GRN (GLOVE) ×2 IMPLANT
GOWN STRL REUS W/ TWL LRG LVL3 (GOWN DISPOSABLE) ×1 IMPLANT
GOWN STRL REUS W/ TWL XL LVL3 (GOWN DISPOSABLE) ×1 IMPLANT
GOWN STRL REUS W/TWL LRG LVL3 (GOWN DISPOSABLE) ×1
GOWN STRL REUS W/TWL XL LVL3 (GOWN DISPOSABLE) ×1
HEMOSTAT SURGICEL 2X3 (HEMOSTASIS) IMPLANT
IV NS 500ML (IV SOLUTION) ×1
IV NS 500ML BAXH (IV SOLUTION) ×1 IMPLANT
LABEL OR SOLS (LABEL) ×2 IMPLANT
LOOP RED MAXI  1X406MM (MISCELLANEOUS) ×1
LOOP VESSEL MAXI 1X406 RED (MISCELLANEOUS) ×1 IMPLANT
LOOP VESSEL MINI 0.8X406 BLUE (MISCELLANEOUS) ×1 IMPLANT
LOOPS BLUE MINI 0.8X406MM (MISCELLANEOUS) ×1
NEEDLE FILTER BLUNT 18X 1/2SAF (NEEDLE) ×1
NEEDLE FILTER BLUNT 18X1 1/2 (NEEDLE) ×1 IMPLANT
NS IRRIG 500ML POUR BTL (IV SOLUTION) ×2 IMPLANT
PACK EXTREMITY ARMC (MISCELLANEOUS) ×2 IMPLANT
PAD PREP 24X41 OB/GYN DISP (PERSONAL CARE ITEMS) IMPLANT
SLING ARM M TX990204 (SOFTGOODS) ×2 IMPLANT
SOLUTION CELL SAVER (CLIP) ×1 IMPLANT
STOCKINETTE STRL 4IN 9604848 (GAUZE/BANDAGES/DRESSINGS) ×2 IMPLANT
STRAP SAFETY BODY (MISCELLANEOUS) ×2 IMPLANT
SUT MNCRL AB 4-0 PS2 18 (SUTURE) ×2 IMPLANT
SUT PROLENE 5 0 RB 1 DA (SUTURE) ×2 IMPLANT
SUT PROLENE 6 0 BV (SUTURE) ×2 IMPLANT
SUT SILK 2 0 (SUTURE) ×1
SUT SILK 2 0 SH (SUTURE) ×2 IMPLANT
SUT SILK 2-0 18XBRD TIE 12 (SUTURE) ×1 IMPLANT
SUT SILK 3 0 (SUTURE) ×1
SUT SILK 3-0 18XBRD TIE 12 (SUTURE) ×1 IMPLANT
SUT SILK 4 0 (SUTURE) ×1
SUT SILK 4-0 18XBRD TIE 12 (SUTURE) ×1 IMPLANT
SUT VIC AB 3-0 SH 27 (SUTURE) ×1
SUT VIC AB 3-0 SH 27X BRD (SUTURE) ×1 IMPLANT
SYR 20CC LL (SYRINGE) ×2 IMPLANT
SYR 3ML LL SCALE MARK (SYRINGE) ×2 IMPLANT

## 2016-12-19 NOTE — Progress Notes (Signed)
Dr Lucky Cowboy into see

## 2016-12-19 NOTE — OR Nursing (Signed)
Patient has bruising around right eye from procedure done in office. Perm cath in right side shoulder region.

## 2016-12-19 NOTE — Transfer of Care (Signed)
Immediate Anesthesia Transfer of Care Note  Patient: Stephanie Ellis  Procedure(s) Performed: Procedure(s): BANDING OF AV GRAFT (Left)  Patient Location: PACU  Anesthesia Type:General  Level of Consciousness: awake, alert , oriented and patient cooperative  Airway & Oxygen Therapy: Patient Spontanous Breathing and Patient connected to face mask oxygen  Post-op Assessment: Report given to RN and Post -op Vital signs reviewed and stable  Post vital signs: Reviewed and stable  Last Vitals:  Vitals:   12/19/16 1215 12/19/16 1220  BP: (!) 221/70 (!) 194/78  Pulse: 64 66  Resp: 12 18  Temp:    SpO2: 100% 100%    Last Pain:  Vitals:   12/19/16 1045  TempSrc: Tympanic  PainSc: 6       Patients Stated Pain Goal: 1 (50/75/73 2256)  Complications: No apparent anesthesia complications

## 2016-12-19 NOTE — Anesthesia Post-op Follow-up Note (Signed)
Anesthesia QCDR form completed.        

## 2016-12-19 NOTE — Anesthesia Procedure Notes (Signed)
Anesthesia Regional Block: Supraclavicular block   Pre-Anesthetic Checklist: ,, timeout performed, Correct Patient, Correct Site, Correct Laterality, Correct Procedure, Correct Position, site marked, Risks and benefits discussed,  Surgical consent,  Pre-op evaluation,  At surgeon's request and post-op pain management  Laterality: Upper and Left  Prep: chloraprep       Needles:  Injection technique: Single-shot  Needle Type: Stimiplex     Needle Length: 5cm  Needle Gauge: 22     Additional Needles:   Procedures: ultrasound guided,,,,,,,,  Narrative:  Start time: 12/19/2016 12:04 PM End time: 12/19/2016 12:08 PM Injection made incrementally with aspirations every 5 mL.  Performed by: Personally  Anesthesiologist: Katy Fitch K  Additional Notes: Patient has baseline weakness and numbness in left arm and hand  Functioning IV was confirmed and monitors were applied.  A 71m 22ga Stimuplex needle was used. Sterile prep,hand hygiene and sterile gloves were used.  Minimal sedation used for procedure.  No paresthesia endorsed by patient during the procedure.  Negative aspiration and negative test dose prior to incremental administration of local anesthetic. The patient tolerated the procedure well with no immediate complications.

## 2016-12-19 NOTE — Progress Notes (Signed)
Ling applied to left arm for support

## 2016-12-19 NOTE — Anesthesia Preprocedure Evaluation (Signed)
Anesthesia Evaluation  Patient identified by MRN, date of birth, ID band Patient awake    Reviewed: Allergy & Precautions, H&P , NPO status , Patient's Chart, lab work & pertinent test results  History of Anesthesia Complications (+) history of anesthetic complications  Airway Mallampati: III  TM Distance: >3 FB Neck ROM: limited    Dental  (+) Poor Dentition, Chipped, Missing   Pulmonary neg pulmonary ROS, neg shortness of breath,           Cardiovascular Exercise Tolerance: Poor hypertension, (-) angina+ CAD, + Past MI, + CABG and +CHF  + dysrhythmias + Valvular Problems/Murmurs      Neuro/Psych negative neurological ROS  negative psych ROS   GI/Hepatic Neg liver ROS, GERD  Medicated and Controlled,  Endo/Other  diabetes, Type 2, Insulin Dependent  Renal/GU DialysisRenal disease  negative genitourinary   Musculoskeletal   Abdominal   Peds  Hematology negative hematology ROS (+)   Anesthesia Other Findings Patient reports baseline numbness and weakness in left hand and arm  Past Medical History: No date: Anemia No date: Anorexia No date: Bacteremia due to Pseudomonas No date: CHF (congestive heart failure) ([place])     Comment:  has not happened recently 54/0086: Complication of anesthesia     Comment:  WITH LAST STENT 10/17. 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 Marland Kitchen [place]cock: CABG x 3, LIMA to LAD,               SVG to RCA, SVG to OM3, EVH No date: Type II diabetes mellitus with complication ([place])     Comment:  CAD  Past Surgical History: No date: ABDOMINAL HYSTERECTOMY 05/2016: ARTERIOVENOUS GRAFT PLACEMENT 05/30/2016: AV FISTULA PLACEMENT; Left     Comment:  Procedure: ARTERIOVENOUS graft;  Surgeon: Algernon Huxley,               MD;  Location: [facility] ORS;  Service: Vascular;  Laterality:              Left; 01/2015: CARDIAC CATHETERIZATION     Comment:  [facility]: Ost LM 70%, p-m LAD 50-60% (Rest FFR + @ 0.75),               mRCA 80-90%, ostD1 40%, pOM1 40% 01/18/2016: CATARACT EXTRACTION W/PHACO; Left     Comment:  Procedure: CATARACT EXTRACTION PHACO AND INTRAOCULAR               LENS PLACEMENT (IOC);  Surgeon: Eulogio Bear, MD;                Location: [facility] ORS;  Service: Ophthalmology;  Laterality:              Left;  [place] 1.05 AP% 15.5  CDE 10.16 Fluid Pack Lot #               [id number] 08/01/2016: CATARACT EXTRACTION W/PHACO; Right     Comment:  Procedure: CATARACT EXTRACTION PHACO AND INTRAOCULAR               LENS PLACEMENT (IOC);  Surgeon: Eulogio Bear, MD;                Location: [facility] ORS;  Service: Ophthalmology;  Laterality:              Right;  [place] 01:00.6 AP% 11.4 CDE 6.93  LOT # [id number] No date: COLONOSCOPY No date: CORONARY ANGIOPLASTY     Comment:  [facility] 02/12/16 03/28/15: CORONARY ARTERY BYPASS GRAFT     Comment:   [facility]: Dr. Waldemar Dickens: LIMA to LAD, SVG to RCA, SVG to               OM3, [facility] 10/24/2016: ESOPHAGOGASTRODUODENOSCOPY (EGD) WITH PROPOFOL; N/A     Comment:  Procedure: ESOPHAGOGASTRODUODENOSCOPY (EGD) WITH               PROPOFOL;  Surgeon: [name], MD;  Location: [facility]               ENDOSCOPY;  Service: Endoscopy;  Laterality: N/A; No date: EYE SURGERY; Bilateral     Comment:  cataract surgery 08/01/2016: INSERTION EXPRESS TUBE SHUNT; Right     Comment:  Procedure:  INSERTION EXPRESS TUBE SHUNT;  Surgeon:               Eulogio Bear, MD;  Location: [facility] ORS;  Service:               Ophthalmology;  Laterality: Right; 08/15/2016: INSERTION OF AHMED VALVE; Left     Comment:  Procedure: INSERTION OF AHMED VALVE;  Surgeon: Eulogio Bear, MD;  Location: [facility] ORS;  Service:               Ophthalmology;  Laterality: Left; No date: INSERTION OF DIALYSIS CATHETER January 2017: TRANSTHORACIC ECHOCARDIOGRAM     Comment:   EF 60-65%. GR 2 DD. Mild degenerative mitral valve               disease but no prolapse or regurgitation. Mild left               atrial dilation. Mild to moderate LVH. Pericardial               effusion gone 06/27/2016: UPPER EXTREMITY ANGIOGRAPHY; Left     Comment:  Procedure: Upper Extremity Angiography;  Surgeon: Algernon Huxley, MD;  Location: [facility];  Service:               Cardiovascular;  Laterality: Left; 08/22/2016: UPPER EXTREMITY ANGIOGRAPHY; Left     Comment:  Procedure: Upper Extremity Angiography;  Surgeon: Algernon Huxley, MD;  Location: [facility];  Service:               Cardiovascular;  Laterality: Left;  BMI    Body Mass Index:  20.14 kg/m      Reproductive/Obstetrics negative OB ROS  Anesthesia Physical Anesthesia Plan  ASA: IV  Anesthesia Plan: General and Regional   Post-op Pain Management: GA combined w/ Regional for post-op pain   Induction: Intravenous  PONV Risk Score and Plan:   Airway Management Planned: Natural Airway and Nasal Cannula  Additional Equipment:   Intra-op Plan:   Post-operative Plan:   Informed Consent: I have reviewed the patients History and Physical, chart, labs and discussed the procedure including the risks, benefits and alternatives for the proposed anesthesia with the patient or authorized representative who has indicated his/her understanding and acceptance.    Dental Advisory Given  Plan Discussed with: Anesthesiologist, CRNA and Surgeon  Anesthesia Plan Comments: (Patient consented for increased risk of nerve injury due to her neuropathy.  However, I feel that the risk of nerve injury is less than the risks of true general anesthesia.  This was explained to patient who voiced understanding.  Patient consented for risks of anesthesia including but not limited to:  - adverse reactions to medications - risk of intubation if required - damage to teeth, lips or other oral mucosa - sore throat or hoarseness - Damage to heart, brain, lungs or loss of life  Patient voiced understanding.)        Anesthesia Quick Evaluation

## 2016-12-19 NOTE — Op Note (Signed)
[facility]   OPERATIVE NOTE  DATE: 12/19/2016  PRE-OPERATIVE DIAGNOSIS: ESRD, steal syndrome left arm  POST-OPERATIVE DIAGNOSIS: same as above  PROCEDURE: 1.   Banding of left loop forearm AVG  SURGEON: Leotis Pain, MD  ASSISTANT(S): None  ANESTHESIA: general  ESTIMATED BLOOD LOSS: minimal  FINDING(S): 1.  none  SPECIMEN(S):  None  INDICATIONS:   Patient is a 66 y.o.female who presents with steal syndrome and a large patent left loop forearm AVG. Risks and benefits were discussed and the patient was agreeable to proceed. Patient understands that thrombosis is likely an AV graft after banding, but given her severe unrelenting symptoms she is willing to proceed.  DESCRIPTION: After obtaining full informed written consent, the patient was brought back to the operating room and placed supine upon the operating table.  The patient received IV antibiotics prior to induction.  After obtaining adequate anesthesia, the patient was prepped and draped in the standard fashion. I created a small transverse incision in the mid to proximal forearm laterally overlying the AVG.  The graft was dissected out.  In that area, a pair of 2-0 silk ties were used to band the AVG and reduce its size to about 3-4 mm. A small rent in the graft required a mattress Prolene suture for hemostasis. There was still a thrill present proximal to the banding, but this was much more pulsatile, and the graft was clearly narrowed and the flow reduced. The wound was then irrigated and closed with a 3-0 Vicryl and a 4-0 Monocryl. Dermabond was placed as a dressing. The patient was taken to the recovery room in stable condition having tolerated the procedure well.  COMPLICATIONS: None  CONDITION: Stable   Leotis Pain 12/19/2016 1:25 PM  This note was created with Dragon Medical transcription system. Any errors in dictation are purely unintentional.

## 2016-12-19 NOTE — H&P (Signed)
[facility] History & Physical Update  The patient was interviewed and re-examined.  The patient's previous History and Physical has been reviewed and is unchanged.  There is no change in the plan of care. She has persistent steal despite percutaneous intervention and banding of the access.  We will ligate it today. We plan to proceed with the scheduled procedure.  Leotis Pain, MD  12/19/2016, 12:12 PM

## 2016-12-20 NOTE — Anesthesia Postprocedure Evaluation (Addendum)
Anesthesia Post Note  Patient: Crytal Pensinger  Procedure(s) Performed: Procedure(s) (LRB): BANDING OF AV GRAFT (Left)  Patient location during evaluation: PACU Anesthesia Type: Regional Level of consciousness: awake and alert Pain management: pain level controlled Vital Signs Assessment: post-procedure vital signs reviewed and stable Respiratory status: spontaneous breathing, nonlabored ventilation, respiratory function stable and patient connected to nasal cannula oxygen Cardiovascular status: blood pressure returned to baseline and stable Postop Assessment: no signs of nausea or vomiting Anesthetic complications: no     Last Vitals:  Vitals:   12/19/16 1411 12/19/16 1442  BP: (!) 196/80 (!) 177/80  Pulse: (!) 58 (!) 56  Resp: 16   Temp: (!) 36 C   SpO2: 100% 100%    Last Pain:  Vitals:   12/20/16 0844  TempSrc:   PainSc: 2                  Precious Haws Piscitello

## 2016-12-24 ENCOUNTER — Telehealth (INDEPENDENT_AMBULATORY_CARE_PROVIDER_SITE_OTHER): Payer: Self-pay

## 2016-12-24 NOTE — Telephone Encounter (Signed)
Informed Sherry what  Dr. Lucky Cowboy recommended and said. See note below.

## 2016-12-24 NOTE — Telephone Encounter (Signed)
Sherry from [facility] called stating that over the weekend she saw the patient, she had a left forearm loop banding on 12/19/16. Per Judeen Hammans she could not hear or palpate bruit, she spoke with the on call doctor and was told to call our office.

## 2016-12-25 ENCOUNTER — Telehealth (INDEPENDENT_AMBULATORY_CARE_PROVIDER_SITE_OTHER): Payer: Self-pay

## 2016-12-26 ENCOUNTER — Telehealth (INDEPENDENT_AMBULATORY_CARE_PROVIDER_SITE_OTHER): Payer: Self-pay | Admitting: Vascular Surgery

## 2016-12-26 NOTE — Telephone Encounter (Signed)
NURSE KIM FROM [facility] CALLED, THE PAIN THAT WAS IN Stephanie Ellis'S LEFT HAND IS GONE, HALF THE NUMBNESS IS GONE. WANTING TO KNOW IF THE NUMBNESS THAT IS STILL THERE WILL IT GO AWAY WITH TIME OR IS THIS SOMETHING TO BE CONCERNED ABOUT? [place], [phone].

## 2016-12-27 NOTE — Telephone Encounter (Signed)
Called the patient/[facility] nurse to let them know that Dr. Lucky Cowboy said that her numbness may never go away and that she should expect some numbness for a few weeks and or even months in general.

## 2017-01-21 ENCOUNTER — Encounter (INDEPENDENT_AMBULATORY_CARE_PROVIDER_SITE_OTHER): Payer: Medicare Other | Admitting: Vascular Surgery

## 2017-01-21 ENCOUNTER — Ambulatory Visit (INDEPENDENT_AMBULATORY_CARE_PROVIDER_SITE_OTHER): Payer: Medicare Other | Admitting: Vascular Surgery

## 2017-01-21 ENCOUNTER — Encounter (INDEPENDENT_AMBULATORY_CARE_PROVIDER_SITE_OTHER): Payer: Self-pay | Admitting: Vascular Surgery

## 2017-01-21 VITALS — BP 114/58 | HR 70 | Resp 15 | Ht 64.0 in | Wt 124.0 lb

## 2017-01-21 DIAGNOSIS — Z794 Long term (current) use of insulin: Secondary | ICD-10-CM

## 2017-01-21 DIAGNOSIS — E118 Type 2 diabetes mellitus with unspecified complications: Secondary | ICD-10-CM

## 2017-01-21 DIAGNOSIS — Z992 Dependence on renal dialysis: Secondary | ICD-10-CM

## 2017-01-21 DIAGNOSIS — I10 Essential (primary) hypertension: Secondary | ICD-10-CM

## 2017-01-21 DIAGNOSIS — N186 End stage renal disease: Secondary | ICD-10-CM

## 2017-01-21 NOTE — Assessment & Plan Note (Signed)
blood glucose control important in reducing the progression of atherosclerotic disease. Also, involved in wound healing. On appropriate medications.  

## 2017-01-21 NOTE — Progress Notes (Signed)
Patient ID: Stephanie Ellis, female   DOB: 1951-03-28, 66 y.o.   MRN: [id number]  Chief Complaint  Patient presents with  . Follow-up    4 week wound recheck    HPI Stephanie Ellis is a 66 y.o. female.  Patient returns in follow-up. Her left hand still feels somewhat numb and painful even after ligation of her graft. She describes the hand as something that is trying to wake up and thaw out after being frozen. Her skin temperature is better and she does not have ulceration or tissue loss at this point. She is currently using her catheter for dialysis.   Past Medical History:  Diagnosis Date  . Anemia   . Anorexia   . Bacteremia due to Pseudomonas   . CHF (congestive heart failure) (HCC)    has not happened recently  . Complication of anesthesia 01/2016   WITH LAST STENT 10/17. PASsED OUT AND HAD TO BE AWAKENED  . Coronary artery disease involving left main coronary artery 01/2015   [facility]: 70% LM, p-mLAD 50-60% (Resting FFR 0.75), mRCA 80-90%, ~40 Ost OM & D1  . Dysrhythmia   . ESRD (end stage renal disease) on dialysis [facility])    ESRD secondary to acute kidney failure s/p CABG  DIALYSIS M/W/F  . Essential hypertension   . GERD (gastroesophageal reflux disease)   . Heart murmur    no treatment  . Hypercholesterolemia   . Myocardial infarction ([place]) 2016  . 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 Marland Ellis Haithcock: CABG x 3, LIMA to LAD, SVG to RCA, SVG to OM3, EVH  . Type II diabetes mellitus with complication (HCC)    CAD    Past Surgical History:  Procedure Laterality Date  . ABDOMINAL HYSTERECTOMY    . ARTERIOVENOUS GRAFT PLACEMENT  05/2016  . AV FISTULA PLACEMENT Left 05/30/2016   Procedure: ARTERIOVENOUS graft;  Surgeon: Algernon Huxley, MD;  Location: [facility] ORS;  Service: Vascular;  Laterality: Left;  . CARDIAC CATHETERIZATION  01/2015   [facility]: Ost LM 70%, p-m LAD 50-60% (Rest FFR + @ 0.75), mRCA 80-90%, ostD1 40%, pOM1 40%  . CATARACT EXTRACTION W/PHACO Left 01/18/2016   Procedure: CATARACT EXTRACTION PHACO AND INTRAOCULAR LENS PLACEMENT (IOC);  Surgeon: Eulogio Bear, MD;  Location: [facility] ORS;  Service: Ophthalmology;  Laterality: Left;  [place] 1.05 AP% 15.5 CDE 10.16 Fluid Pack Lot # [id number]  . CATARACT EXTRACTION W/PHACO Right 08/01/2016   Procedure: CATARACT EXTRACTION PHACO AND INTRAOCULAR LENS PLACEMENT (IOC);  Surgeon: Eulogio Bear, MD;  Location: [facility] ORS;  Service: Ophthalmology;  Laterality: Right;  [place] 01:00.6 AP% 11.4 CDE 6.93  LOT # [id number]  . COLONOSCOPY    . CORONARY ANGIOPLASTY     SENTS 02/12/16  . CORONARY ARTERY BYPASS GRAFT  03/28/15    [facility]: Dr. Waldemar Dickens: LIMA to LAD, SVG to RCA, SVG to OM3, EVH  . ESOPHAGOGASTRODUODENOSCOPY (EGD) WITH PROPOFOL N/A 10/24/2016   Procedure: ESOPHAGOGASTRODUODENOSCOPY (EGD) WITH PROPOFOL;  Surgeon: [name], MD;  Location: [facility] ENDOSCOPY;  Service: Endoscopy;  Laterality: N/A;  . EYE SURGERY Bilateral    cataract surgery  . INSERTION EXPRESS TUBE SHUNT Right 08/01/2016   Procedure: INSERTION EXPRESS TUBE SHUNT;  Surgeon: Eulogio Bear, MD;  Location: [facility] ORS;  Service: Ophthalmology;  Laterality: Right;  . INSERTION OF AHMED VALVE Left 08/15/2016   Procedure: INSERTION OF AHMED VALVE;  Surgeon: Eulogio Bear, MD;  Location: [facility] ORS;  Service: Ophthalmology;  Laterality: Left;  .  INSERTION OF DIALYSIS CATHETER    . TRANSTHORACIC ECHOCARDIOGRAM  January 2017    EF 60-65%. GR 2 DD. Mild degenerative mitral valve disease but no prolapse or regurgitation. Mild left atrial dilation. Mild to moderate LVH. Pericardial effusion gone  . UPPER EXTREMITY ANGIOGRAPHY Left 06/27/2016   Procedure: Upper Extremity Angiography;  Surgeon: Algernon Huxley, MD;  Location: [employer] CV LAB;  Service: Cardiovascular;  Laterality: Left;  . UPPER EXTREMITY ANGIOGRAPHY Left 08/22/2016   Procedure: Upper Extremity Angiography;  Surgeon: Algernon Huxley, MD;  Location: [place] CV LAB;  Service: Cardiovascular;   Laterality: Left;      Allergies  Allergen Reactions  . Chlorthalidone Anaphylaxis, Itching and Rash  . Fentanyl Rash  . Midazolam Rash  . Ace Inhibitors Other (See Comments)    Reaction:  Hyperkalemia, agitation   . Angiotensin Receptor Blockers Other (See Comments)    Reaction:  Hyperkalemia, agitation   . Norvasc [Amlodipine] Itching and Rash  . Phenergan [Promethazine Hcl] Anxiety    "antsy, can't sit still"    Current Outpatient Prescriptions  Medication Sig Dispense Refill  . aspirin EC 81 MG tablet Take 1 tablet (81 mg total) by mouth daily.    Stephanie Ellis atorvastatin (LIPITOR) 40 MG tablet Take 40 mg by mouth daily at 6 PM.     . ATROPINE SULFATE-NACL OP Apply 1 drop to eye 2 (two) times daily. Left eye only    . Cholecalciferol (VITAMIN D3) 1000 units CAPS Take 1 capsule by mouth daily.    . clopidogrel (PLAVIX) 75 MG tablet Take 1 tablet (75 mg total) by mouth daily. dont take for next five day 30 tablet 0  . insulin glargine (LANTUS) 100 unit/mL SOPN Inject 14 Units into the skin every evening.     . isosorbide mononitrate (IMDUR) 60 MG 24 hr tablet Take 60 mg by mouth daily.    Stephanie Ellis lisinopril (PRINIVIL,ZESTRIL) 10 MG tablet Take 10 mg by mouth daily.    . metoCLOPramide (REGLAN) 10 MG tablet Take 1 tablet (10 mg total) by mouth every 6 (six) hours as needed for nausea, vomiting or refractory nausea / vomiting. 30 tablet 0  . METOPROLOL TARTRATE PO Take 25 mg by mouth 2 (two) times daily.    . Multiple Vitamin (MULTIVITAMIN WITH MINERALS) TABS tablet Take 2 tablets by mouth daily. Gummy vitamins    . nitroGLYCERIN (NITROSTAT) 0.4 MG SL tablet Place 1 tablet (0.4 mg total) under the tongue every 5 (five) minutes as needed for chest pain. 30 tablet 0  . rOPINIRole (REQUIP) 0.25 MG tablet Take 0.25 mg by mouth at bedtime.    . sevelamer carbonate (RENVELA) 800 MG tablet Take 800 mg by mouth 3 (three) times daily with meals.     No current facility-administered medications for this  visit.         Physical Exam BP (!) 114/58 (BP Location: Right Arm)   Pulse 70   Resp 15   Ht 5' 4"  (1.626 m)   Wt 56.2 kg (124 lb)   BMI 21.28 kg/m  Gen:  WD/WN, NAD Skin: incision C/D/I     Assessment/Plan:  Hypertension blood pressure control important in reducing the progression of atherosclerotic disease. On appropriate oral medications.   Type II diabetes mellitus with complication (HCC) blood glucose control important in reducing the progression of atherosclerotic disease. Also, involved in wound healing. On appropriate medications.   ESRD (end stage renal disease) on dialysis [facility]) Her graft has been ligated  to so this would no longer be a steal syndrome of the left hand, but her hand is not gained normal function per se either. She should continue to exercise her hand and use it. It certainly is not appear acutely threatened at this point. I have offered her referral to neurology for nerve evaluation but she has declined this. I'll plan to see her back in 2-3 months. I would recommend she continue using her catheter for the time being, and we will at some point likely have to move to a right arm access.      Leotis Pain 01/21/2017, 2:56 PM   This note was created with Dragon medical transcription system.  Any errors from dictation are unintentional.

## 2017-01-21 NOTE — Assessment & Plan Note (Signed)
Her graft has been ligated to so this would no longer be a steal syndrome of the left hand, but her hand is not gained normal function per se either. She should continue to exercise her hand and use it. It certainly is not appear acutely threatened at this point. I have offered her referral to neurology for nerve evaluation but she has declined this. I'll plan to see her back in 2-3 months. I would recommend she continue using her catheter for the time being, and we will at some point likely have to move to a right arm access.

## 2017-01-21 NOTE — Assessment & Plan Note (Signed)
blood pressure control important in reducing the progression of atherosclerotic disease. On appropriate oral medications.  

## 2017-01-24 ENCOUNTER — Emergency Department
Admission: EM | Admit: 2017-01-24 | Discharge: 2017-01-24 | Disposition: A | Discharge status: Home / Self Care | Payer: Medicare Other | Attending: Emergency Medicine | Admitting: Emergency Medicine

## 2017-01-24 DIAGNOSIS — I2510 Atherosclerotic heart disease of native coronary artery without angina pectoris: Secondary | ICD-10-CM | POA: Diagnosis not present

## 2017-01-24 DIAGNOSIS — N186 End stage renal disease: Secondary | ICD-10-CM | POA: Insufficient documentation

## 2017-01-24 DIAGNOSIS — Z79899 Other long term (current) drug therapy: Secondary | ICD-10-CM | POA: Diagnosis not present

## 2017-01-24 DIAGNOSIS — Z7902 Long term (current) use of antithrombotics/antiplatelets: Secondary | ICD-10-CM | POA: Diagnosis not present

## 2017-01-24 DIAGNOSIS — I509 Heart failure, unspecified: Secondary | ICD-10-CM | POA: Insufficient documentation

## 2017-01-24 DIAGNOSIS — R51 Headache: Secondary | ICD-10-CM | POA: Insufficient documentation

## 2017-01-24 DIAGNOSIS — E1122 Type 2 diabetes mellitus with diabetic chronic kidney disease: Secondary | ICD-10-CM | POA: Insufficient documentation

## 2017-01-24 DIAGNOSIS — I132 Hypertensive heart and chronic kidney disease with heart failure and with stage 5 chronic kidney disease, or end stage renal disease: Secondary | ICD-10-CM | POA: Diagnosis not present

## 2017-01-24 DIAGNOSIS — Z992 Dependence on renal dialysis: Secondary | ICD-10-CM | POA: Diagnosis not present

## 2017-01-24 DIAGNOSIS — R519 Headache, unspecified: Secondary | ICD-10-CM

## 2017-01-24 DIAGNOSIS — Z951 Presence of aortocoronary bypass graft: Secondary | ICD-10-CM | POA: Insufficient documentation

## 2017-01-24 DIAGNOSIS — Z794 Long term (current) use of insulin: Secondary | ICD-10-CM | POA: Insufficient documentation

## 2017-01-24 DIAGNOSIS — Z7982 Long term (current) use of aspirin: Secondary | ICD-10-CM | POA: Diagnosis not present

## 2017-01-24 MED ORDER — DIPHENHYDRAMINE HCL 50 MG/ML IJ SOLN
25.0000 mg | Freq: Once | INTRAMUSCULAR | Status: AC
  Administered 2017-01-24: 25 mg via INTRAVENOUS

## 2017-01-24 MED ORDER — METOCLOPRAMIDE HCL 5 MG/ML IJ SOLN
10.0000 mg | Freq: Once | INTRAMUSCULAR | Status: AC
  Administered 2017-01-24: 10 mg via INTRAVENOUS
  Filled 2017-01-24: qty 2

## 2017-01-24 MED ORDER — DIPHENHYDRAMINE HCL 50 MG/ML IJ SOLN
INTRAMUSCULAR | Status: AC
  Administered 2017-01-24: 25 mg via INTRAVENOUS
  Filled 2017-01-24: qty 1

## 2017-01-24 NOTE — Discharge Instructions (Signed)
Return to the ER for new, worsening, or persistent headache, confusion or change in mental status, vision changes, fever or chills, neck stiffness, or any other new or worsening symptoms that concern you. Follow-up with your regular doctor and go to your normal dialysis on Monday.

## 2017-01-24 NOTE — ED Triage Notes (Signed)
Pt came to ED via EMS from [facility]. Pt did not complete dialysis treatment. Started c/o left sided headache with blurry vision and nausea. Pt reports usually gets headache on dialysis but nausea is abnormal. Bp 166/69.

## 2017-01-24 NOTE — ED Notes (Signed)
Patient c/o generalized headache that "sometimes happens after dialysis"

## 2017-01-24 NOTE — ED Provider Notes (Signed)
[facility] Emergency Department Provider Note ____________________________________________   First MD Initiated Contact with Patient 01/24/17 1432     (approximate)  I have reviewed the triage vital signs and the nursing notes.   HISTORY  Chief Complaint Headache    HPI Stephanie Ellis is a 66 y.o. female with a history of end-stage renal disease on dialysis, who presents with headache, acute onset while she was getting dialysis, mainly in the right frontal part of her head, and associated with nausea. Patient states that the dialysis was cut slightly short but that she got almost 4 hours of it. Patient states she has had similar headaches towards end of her dialysis multiple times in the past, but they're not usually associated with nausea like today. Patient states that the nausea itself is resolved. Headache is improved but she still has it somewhat. She states that she was given Tylenol and this has helped. Patient denies any shortness of breath, lightheadedness, fever or chills, recent illness, or any other new symptoms.     Past Medical History:  Diagnosis Date  . Anemia   . Anorexia   . Bacteremia due to Pseudomonas   . CHF (congestive heart failure) (HCC)    has not happened recently  . Complication of anesthesia 01/2016   WITH LAST STENT 10/17. PASsED OUT AND HAD TO BE AWAKENED  . Coronary artery disease involving left main coronary artery 01/2015   [facility]: 70% LM, p-mLAD 50-60% (Resting FFR 0.75), mRCA 80-90%, ~40 Ost OM & D1  . Dysrhythmia   . ESRD (end stage renal disease) on dialysis [facility])    ESRD secondary to acute kidney failure s/p CABG  DIALYSIS M/W/F  . Essential hypertension   . GERD (gastroesophageal reflux disease)   . Heart murmur    no treatment  . Hypercholesterolemia   . Myocardial infarction ([place]) 2016  . 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 Marland Kitchen Haithcock: CABG x 3, LIMA to LAD, SVG to RCA, SVG to OM3, EVH  . Type II  diabetes mellitus with complication [facility])    CAD    Patient Active Problem List   Diagnosis Date Noted  . Vomiting 10/24/2016  . Hematuria 10/17/2016  . Acute lower UTI 10/17/2016  . Anesthesia complication 69/67/8938  . Chest pain with moderate risk for cardiac etiology 09/04/2016  . Steal syndrome dialysis vascular access, initial encounter ([place]) 06/18/2016  . Hypertension 02/02/2016  . Hyperlipidemia 02/02/2016  . Coronary artery disease involving left main coronary artery 10/04/2015  . Abdominal pain of unknown etiology 10/04/2015  . ESRD (end stage renal disease) on dialysis ([place])   . Type II diabetes mellitus with complication ([place])   . NSTEMI (non-ST elevated myocardial infarction) ([place])   . Right sided abdominal pain   . Elevated troponin 10/03/2015  . 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 ABDOMINAL HYSTERECTOMY    . ARTERIOVENOUS GRAFT PLACEMENT  05/2016  . AV FISTULA PLACEMENT Left 05/30/2016   Procedure: ARTERIOVENOUS graft;  Surgeon: Algernon Huxley, MD;  Location: [facility] ORS;  Service: Vascular;  Laterality: Left;  . CARDIAC CATHETERIZATION  01/2015   [facility]: Ost LM 70%, p-m LAD 50-60% (Rest FFR + @ 0.75), mRCA 80-90%, ostD1 40%, pOM1 40%  . CATARACT EXTRACTION W/PHACO Left 01/18/2016   Procedure: CATARACT EXTRACTION PHACO AND INTRAOCULAR LENS PLACEMENT (IOC);  Surgeon: Eulogio Bear, MD;  Location: [facility] ORS;  Service: Ophthalmology;  Laterality: Left;  [place] 1.05 AP% 15.5 CDE 10.16 Fluid Pack Lot # [id number]  . CATARACT EXTRACTION W/PHACO Right 08/01/2016   Procedure: CATARACT EXTRACTION PHACO AND INTRAOCULAR LENS PLACEMENT (IOC);  Surgeon: Eulogio Bear, MD;  Location: [facility] ORS;  Service: Ophthalmology;  Laterality: Right;  [place] 01:00.6 AP% 11.4 CDE 6.93  LOT # [id number]  . COLONOSCOPY    . CORONARY ANGIOPLASTY     SENTS 02/12/16  . CORONARY ARTERY BYPASS GRAFT  03/28/15    [facility]: Dr. Waldemar Dickens: LIMA to LAD, SVG to RCA, SVG to  OM3, EVH  . ESOPHAGOGASTRODUODENOSCOPY (EGD) WITH PROPOFOL N/A 10/24/2016   Procedure: ESOPHAGOGASTRODUODENOSCOPY (EGD) WITH PROPOFOL;  Surgeon: [name], MD;  Location: [facility] ENDOSCOPY;  Service: Endoscopy;  Laterality: N/A;  . EYE SURGERY Bilateral    cataract surgery  . INSERTION EXPRESS TUBE SHUNT Right 08/01/2016   Procedure: INSERTION EXPRESS TUBE SHUNT;  Surgeon: Eulogio Bear, MD;  Location: [facility] ORS;  Service: Ophthalmology;  Laterality: Right;  . INSERTION OF AHMED VALVE Left 08/15/2016   Procedure: INSERTION OF AHMED VALVE;  Surgeon: Eulogio Bear, MD;  Location: [facility] ORS;  Service: Ophthalmology;  Laterality: Left;  . INSERTION OF DIALYSIS CATHETER    . TRANSTHORACIC ECHOCARDIOGRAM  January 2017    EF 60-65%. GR 2 DD. Mild degenerative mitral valve disease but no prolapse or regurgitation. Mild left atrial dilation. Mild to moderate LVH. Pericardial effusion gone  . UPPER EXTREMITY ANGIOGRAPHY Left 06/27/2016   Procedure: Upper Extremity Angiography;  Surgeon: Algernon Huxley, MD;  Location: [place] CV LAB;  Service: Cardiovascular;  Laterality: Left;  . UPPER EXTREMITY ANGIOGRAPHY Left 08/22/2016   Procedure: Upper Extremity Angiography;  Surgeon: Algernon Huxley, MD;  Location: [place] CV LAB;  Service: Cardiovascular;  Laterality: Left;    Prior to Admission medications   Medication Sig Start Date End Date Taking? Authorizing Provider  aspirin EC 81 MG tablet Take 1 tablet (81 mg total) by mouth daily. 10/19/16  Yes Dustin Flock, MD  atorvastatin (LIPITOR) 40 MG tablet Take 40 mg by mouth daily at 6 PM.    Yes [provider]  ATROPINE SULFATE-NACL OP Apply 1 drop to eye 2 (two) times daily. Left eye only   Yes [provider]  Cholecalciferol (VITAMIN D3) 1000 units CAPS Take 1 capsule by mouth daily.   Yes [provider]  clopidogrel (PLAVIX) 75 MG tablet Take 1 tablet (75 mg total) by mouth daily. dont take for next five day 10/19/16  Yes  Dustin Flock, MD  insulin glargine (LANTUS) 100 unit/mL SOPN Inject 14 Units into the skin every evening.    Yes [provider]  isosorbide mononitrate (IMDUR) 60 MG 24 hr tablet Take 60 mg by mouth daily.   Yes [provider]  lisinopril (PRINIVIL,ZESTRIL) 10 MG tablet Take 10 mg by mouth daily. 11/04/16 11/04/17 Yes [provider]  METOPROLOL TARTRATE PO Take 25 mg by mouth 2 (two) times daily.   Yes [provider]  Multiple Vitamin (MULTIVITAMIN WITH MINERALS) TABS tablet Take 2 tablets by mouth daily. Gummy vitamins   Yes [provider]  ondansetron (ZOFRAN) 4 MG tablet Take 4 mg by mouth every 8 (eight) hours as needed for nausea or vomiting.   Yes [provider]  polyvinyl alcohol (LIQUIFILM TEARS) 1.4 % ophthalmic solution Place 2 drops into both eyes as needed for dry eyes.   Yes [provider]  rOPINIRole (REQUIP) 0.25 MG tablet Take 0.25  mg by mouth at bedtime.   Yes [provider]  sevelamer carbonate (RENVELA) 800 MG tablet Take 800 mg by mouth 3 (three) times daily with meals.   Yes [provider]  metoCLOPramide (REGLAN) 10 MG tablet Take 1 tablet (10 mg total) by mouth every 6 (six) hours as needed for nausea, vomiting or refractory nausea / vomiting. 10/25/16   Dustin Flock, MD  nitroGLYCERIN (NITROSTAT) 0.4 MG SL tablet Place 1 tablet (0.4 mg total) under the tongue every 5 (five) minutes as needed for chest pain. 09/05/16   Loletha Grayer, MD    Allergies Chlorthalidone; Fentanyl; Midazolam; Ace inhibitors; Angiotensin receptor blockers; Norvasc [amlodipine]; and Phenergan [promethazine hcl]  Family History  Problem Relation Age of Onset  . Diabetes Mellitus II Mother   . Pancreatic cancer Father     Social History Social History  Substance Use Topics  . Smoking status: Never Smoker  . Smokeless tobacco: Never Used  . Alcohol use No    Review of Systems  Constitutional: No  fever. Eyes: No acute visual changes. ENT: No neck pain.  Cardiovascular: Denies chest pain. Respiratory: Denies shortness of breath. Gastrointestinal: Positive for nausea, no vomiting.   Genitourinary: Negative for flank pain.  Musculoskeletal: Negative for back pain. Skin: Negative for rash. Neurological: Positive for headaches, negative for new focal weakness or numbness.   ____________________________________________   PHYSICAL EXAM:  VITAL SIGNS: ED Triage Vitals  Enc Vitals Group     BP 01/24/17 1416 (!) 166/69     Pulse Rate 01/24/17 1416 63     Resp 01/24/17 1416 16     Temp 01/24/17 1416 98.1 F (36.7 C)     Temp Source 01/24/17 1416 Oral     SpO2 01/24/17 1416 97 %     Weight 01/24/17 1417 130 lb (59 kg)     Height 01/24/17 1417 5' 4"  (1.626 m)     Head Circumference --      Peak Flow --      Pain Score --      Pain Loc --      Pain Edu? --      Excl. in [place]? --     Constitutional: Alert and oriented. Well appearing and in no acute distress. Eyes: L eye mild conjunctival injection (pt states it is subacute from treated infection).  EOMI.  PERRLA.  Head: Atraumatic. Nose: No congestion/rhinnorhea. Mouth/Throat: Mucous membranes are moist.   Neck: Normal range of motion.  Cardiovascular: Normal rate, regular rhythm. Grossly normal heart sounds.  Good peripheral circulation. Respiratory: Normal respiratory effort.  No retractions. Lungs CTAB. Gastrointestinal: No distention.  Genitourinary: No CVA tenderness. Musculoskeletal: Extremities warm and well perfused.  Neurologic:  Normal speech and language. No gross focal neurologic deficits are appreciated. Motor and sensory intact in all extremities.  Normal coordination.  Skin:  Skin is warm and dry. No rash noted. Psychiatric: Mood and affect are normal. Speech and behavior are normal.  ____________________________________________   LABS (all labs ordered are listed, but only abnormal results are  displayed)  Labs Reviewed - No data to display ____________________________________________  EKG  ED ECG REPORT I, Arta Silence, the attending physician, personally viewed and interpreted this ECG.  Date: 01/24/2017 EKG Time: 1420 Rate: 63 Rhythm: normal sinus rhythm QRS Axis: left axis deviation Intervals: nonspecific intraventricular conduction delay ST/T Wave abnormalities: LVH with repol abnormality, T wave inversion V4-V5 Narrative Interpretation: no evidence of acute ischemia; no significant change when compared to EKG of  10/24/2016 ____________________________________________  RADIOLOGY    ____________________________________________   PROCEDURES  Procedure(s) performed: No    Critical Care performed: No ____________________________________________   INITIAL IMPRESSION / ASSESSMENT AND PLAN / ED COURSE  Pertinent labs & imaging results that were available during my care of the patient were reviewed by me and considered in my medical decision making (see chart for details).  66 year old female history of ESRD on dialysis presents with frontal headache and nausea after dialysis today. Patient reports history of this headache multiple times in the past, but the nausea was new for her. Vital signs are normal except for hypertension, and exam is otherwise unremarkable. Neuro exam is normal. Overall given prior history of similar symptoms and normal exam, there is no evidence for [facility] or other acute CNS cause, or indication for imaging or other emergent workup. Patient reports significant improvement of symptoms after Tylenol. We will give IV Reglan in addition for symptomatic control and reassess. No indication for lab workup this patient had almost completed her dialysis, her vital signs are reassuring, and she has no evidence of fluid overload on exam.  Anticipate d/c home if sx continue to improve.     ----------------------------------------- 5:19 PM on  01/24/2017 -----------------------------------------  Immediately after Reglan patient had an episode of anxiety and restlessness, which was consistent with an akathisia reaction to the Reglan. Benadryl was given and the symptoms resolved. Patient states that her headache is also resolved, and she feels well to go home. No indication for further emergency Department observation and workup.  ____________________________________________   FINAL CLINICAL IMPRESSION(S) / ED DIAGNOSES  Final diagnoses:  Acute nonintractable headache, unspecified headache type      NEW MEDICATIONS STARTED DURING THIS VISIT:  New Prescriptions   No medications on file     Note:  This document was prepared using Dragon voice recognition software and may include unintentional dictation errors.    Arta Silence, MD 01/24/17 1725

## 2017-02-06 DIAGNOSIS — G40909 Epilepsy, unspecified, not intractable, without status epilepticus: Secondary | ICD-10-CM | POA: Insufficient documentation

## 2017-02-06 DIAGNOSIS — R569 Unspecified convulsions: Secondary | ICD-10-CM | POA: Insufficient documentation

## 2017-02-06 DIAGNOSIS — I10 Essential (primary) hypertension: Secondary | ICD-10-CM | POA: Insufficient documentation

## 2017-02-06 DIAGNOSIS — I161 Hypertensive emergency: Secondary | ICD-10-CM | POA: Insufficient documentation

## 2017-02-06 DIAGNOSIS — G9340 Encephalopathy, unspecified: Secondary | ICD-10-CM | POA: Insufficient documentation

## 2017-04-22 DIAGNOSIS — J329 Chronic sinusitis, unspecified: Secondary | ICD-10-CM

## 2017-04-22 HISTORY — DX: Chronic sinusitis, unspecified: J32.9

## 2017-04-29 ENCOUNTER — Ambulatory Visit (INDEPENDENT_AMBULATORY_CARE_PROVIDER_SITE_OTHER): Payer: Medicare Other | Admitting: Vascular Surgery

## 2017-04-29 ENCOUNTER — Encounter (INDEPENDENT_AMBULATORY_CARE_PROVIDER_SITE_OTHER): Payer: Self-pay | Admitting: Vascular Surgery

## 2017-04-29 VITALS — BP 118/59 | HR 84 | Resp 14 | Ht 64.0 in | Wt 131.0 lb

## 2017-04-29 DIAGNOSIS — Z794 Long term (current) use of insulin: Secondary | ICD-10-CM | POA: Diagnosis not present

## 2017-04-29 DIAGNOSIS — E118 Type 2 diabetes mellitus with unspecified complications: Secondary | ICD-10-CM

## 2017-04-29 DIAGNOSIS — I10 Essential (primary) hypertension: Secondary | ICD-10-CM

## 2017-04-29 DIAGNOSIS — N186 End stage renal disease: Secondary | ICD-10-CM | POA: Diagnosis not present

## 2017-04-29 DIAGNOSIS — Z992 Dependence on renal dialysis: Secondary | ICD-10-CM

## 2017-04-29 NOTE — Assessment & Plan Note (Signed)
The patient has a somewhat difficult dialysis access situation.  She has been catheter dependent for quite a bit of time.  She reports being very fearful to place anything in her right arm all the best access option.  She wants to consider an upper arm AV graft on the left and I have told her I can do that, but the risk of steal on that side is pretty high.  She wants to go home and think about things with her family which is reasonable.  She is going to call our office with her decision on what type of access she would like to have placed.

## 2017-04-29 NOTE — Patient Instructions (Signed)
Vascular Access for Hemodialysis A vascular access is a connection between two blood vessels that allows blood to be easily removed from the body and returned to the body during hemodialysis. Hemodialysis is a procedure in which a machine outside of the body filters the blood. There are three types of vascular accesses:  Arteriovenous fistula. This is a connection between an artery and a vein (usually in the arm) that is made by sewing them together. Blood in the artery flows directly into the vein, causing it to get larger over time. This makes it easier for the vein to be used for hemodialysis. An arteriovenous fistula takes 1-6 months to develop after surgery.  Arteriovenous graft. This is a connection between an artery and a vein in the arm that is made with a tube. An arteriovenous graft can be used within 2-3 weeks of surgery.  Venous catheter. This is a thin, flexible tube that is placed in a large vein (usually in the neck, chest, or groin). A venous catheter for hemodialysis contains two tubes that come out of the skin. A venous catheter can be used right away. It is usually used as a temporary access if you need hemodialysis before a fistula or graft has developed. It may also be used as a permanent access if a fistula or graft cannot be created.  Which type of access is best for me? The type of access that is best for you depends on the size and strength of your veins. A fistula is usually the preferred type of access. It can last several years and is less likely than the other types of accesses to become infected or to cause blood clots within a blood vessel (thrombosis). However, a fistula is not an option for everyone. If your veins are not the right size, a graft may be used instead. Grafts require you to have strong veins. If your veins are not strong enough for a graft, a catheter may be used. Catheters are more likely than fistulas and grafts to become infected or to have  thrombosis. Sometimes, only one type of access is an option. Your health care provider will help you determine which type of access is best for you. How is a vascular access used? The way the access is used depends on the type of access:  If the access is a fistula or graft, two needles are inserted through the skin into the access before each hemodialysis session. Blood leaves the body through one of the needles and travels through a tube to the hemodialysis machine (dialyzer). It then flows through another tube and returns to the body through the second needle.  If the access is a catheter, one tube is connected directly to the tube that leads to the dialyzer and the other is connected to a tube that leads away from the dialyzer. Blood leaves the body through one tube and returns to the body through the other.  What kind of problems can occur with vascular accesses?  Blood clots within a blood vessel (thrombosis). Thrombosis can lead to a narrowing of a blood vessel or tube (stenosis). If thrombosis occurs frequently, another access site may be created as a backup.  Infection. These problems are most likely to occur with a venous catheter and least likely to occur with an arteriovenous fistula. How do I care for my vascular access? Wear a medical alert bracelet. This tells health care providers that you are a dialysis patient in the case of an emergency and   allows them to care for your veins appropriately. If you have a graft or fistula:  A "bruit" is a noise that is heard with a stethoscope and a "thrill" is a vibration felt over the graft or fistula. The presence of the bruit and thrill indicates that the access is working. You will be taught to feel for the thrill each day. If this is not felt, the access may be clotted. Call your health care provider.  You may use the arm where your vascular access is located freely after the site heals. Keep the following in mind: ? Avoid pressure on the  arm. ? Avoid lifting heavy objects with the arm. ? Avoid sleeping on the arm. ? Avoid wearing tight-sleeved shirts or jewelry around the graft or fistula.  Do not allow blood pressure monitoring or needle punctures on the side where the graft or fistula is located.  With permission from your health care provider, you may do exercises to help with blood flow through a fistula. These exercises involve squeezing a rubber ball or other soft objects as instructed.  Contact a health care provider if:  Chills develop.  You have an oral temperature above 102 F (38.9 C).  Swelling around the graft or fistula gets worse.  New pain develops.  Pus or other fluid (drainage) is seen at the vascular access site.  Skin redness or red streaking is seen on the skin around, above, or below the vascular access. Get help right away if:  Pain, numbness, or an unusual pale skin color develops in the hand on the side of your fistula.  Dizziness or weakness develops that you have not had before.  The vascular access has bleeding that cannot be easily controlled. This information is not intended to replace advice given to you by your health care provider. Make sure you discuss any questions you have with your health care provider. Document Released: 06/29/2002 Document Revised: 09/14/2015 Document Reviewed: 08/25/2012 Elsevier Interactive Patient Education  2017 [employer]vier Inc.  

## 2017-04-29 NOTE — Progress Notes (Signed)
MRN : [id number]  Stephanie Ellis is a 67 y.o. ([birth year]) female who presents with chief complaint of  Chief Complaint  Patient presents with  . Follow-up    2-3 month f/u  .  History of Present Illness: Patient returns today in follow up of dialysis access.  She is still using her catheter and this has been in place for over a year. Her left forearm AVG was ligated for steal.  She still has some numbness in her left hand but overall that is much better. She is now here to discuss future options.  She is really worried about having anything put in her right arm.  She says now that her left arm is better she wants to consider putting something back in there.  Current Outpatient Medications  Medication Sig Dispense Refill  . aspirin EC 81 MG tablet Take 1 tablet (81 mg total) by mouth daily.    Marland Kitchen atorvastatin (LIPITOR) 40 MG tablet Take 40 mg by mouth daily at 6 PM.     . ATROPINE SULFATE-NACL OP Apply 1 drop to eye 2 (two) times daily. Left eye only    . carvedilol (COREG) 3.125 MG tablet Take 3.125 mg by mouth.    . cholecalciferol (VITAMIN D) 400 units TABS tablet Take by mouth.    . clopidogrel (PLAVIX) 75 MG tablet Take 1 tablet (75 mg total) by mouth daily. dont take for next five day 30 tablet 0  . insulin glargine (LANTUS) 100 unit/mL SOPN Inject 14 Units into the skin every evening.     . isosorbide mononitrate (IMDUR) 60 MG 24 hr tablet Take 60 mg by mouth daily.    Marland Kitchen lisinopril (PRINIVIL,ZESTRIL) 10 MG tablet Take 10 mg by mouth daily.    Marland Kitchen losartan (COZAAR) 50 MG tablet Take 50 mg by mouth.    . metoCLOPramide (REGLAN) 10 MG tablet Take 1 tablet (10 mg total) by mouth every 6 (six) hours as needed for nausea, vomiting or refractory nausea / vomiting. 30 tablet 0  . METOPROLOL TARTRATE PO Take 25 mg by mouth 2 (two) times daily.    . Multiple Vitamin (MULTIVITAMIN WITH MINERALS) TABS tablet Take 2 tablets by mouth daily. Gummy vitamins    . nitroGLYCERIN (NITROSTAT) 0.4 MG SL  tablet Place 1 tablet (0.4 mg total) under the tongue every 5 (five) minutes as needed for chest pain. 30 tablet 0  . ondansetron (ZOFRAN) 4 MG tablet Take 4 mg by mouth every 8 (eight) hours as needed for nausea or vomiting.    . polyvinyl alcohol (LIQUIFILM TEARS) 1.4 % ophthalmic solution Place 2 drops into both eyes as needed for dry eyes.    Marland Kitchen rOPINIRole (REQUIP) 0.25 MG tablet Take 0.25 mg by mouth at bedtime.    . sevelamer carbonate (RENVELA) 800 MG tablet Take 800 mg by mouth 3 (three) times daily with meals.     No current facility-administered medications for this visit.     Past Medical History:  Diagnosis Date  . Anemia   . Anorexia   . Bacteremia due to Pseudomonas   . CHF (congestive heart failure) (HCC)    has not happened recently  . Complication of anesthesia 01/2016   WITH LAST STENT 10/17. PASsED OUT AND HAD TO BE AWAKENED  . Coronary artery disease involving left main coronary artery 01/2015   [facility]: 70% LM, p-mLAD 50-60% (Resting FFR 0.75), mRCA 80-90%, ~40 Ost OM & D1  . Dysrhythmia   . ESRD (  end stage renal disease) on dialysis [facility])    ESRD secondary to acute kidney failure s/p CABG  DIALYSIS M/W/F  . Essential hypertension   . GERD (gastroesophageal reflux disease)   . Heart murmur    no treatment  . Hypercholesterolemia   . Myocardial infarction ([place]) 2016  . 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 Marland Kitchen Haithcock: CABG x 3, LIMA to LAD, SVG to RCA, SVG to OM3, EVH  . Type II diabetes mellitus with complication (HCC)    CAD    Past Surgical History:  Procedure Laterality Date  . ABDOMINAL HYSTERECTOMY    . ARTERIOVENOUS GRAFT PLACEMENT  05/2016  . AV FISTULA PLACEMENT Left 05/30/2016   Procedure: ARTERIOVENOUS graft;  Surgeon: Algernon Huxley, MD;  Location: [facility] ORS;  Service: Vascular;  Laterality: Left;  . CARDIAC CATHETERIZATION  01/2015   [facility]: Ost LM 70%, p-m LAD 50-60% (Rest FFR + @ 0.75), mRCA 80-90%, ostD1 40%, pOM1 40%  . CATARACT EXTRACTION  W/PHACO Left 01/18/2016   Procedure: CATARACT EXTRACTION PHACO AND INTRAOCULAR LENS PLACEMENT (IOC);  Surgeon: Eulogio Bear, MD;  Location: [facility] ORS;  Service: Ophthalmology;  Laterality: Left;  [place] 1.05 AP% 15.5 CDE 10.16 Fluid Pack Lot # [id number]  . CATARACT EXTRACTION W/PHACO Right 08/01/2016   Procedure: CATARACT EXTRACTION PHACO AND INTRAOCULAR LENS PLACEMENT (IOC);  Surgeon: Eulogio Bear, MD;  Location: [facility] ORS;  Service: Ophthalmology;  Laterality: Right;  [place] 01:00.6 AP% 11.4 CDE 6.93  LOT # [id number]  . COLONOSCOPY    . CORONARY ANGIOPLASTY     SENTS 02/12/16  . CORONARY ARTERY BYPASS GRAFT  03/28/15    [facility]: Dr. Waldemar Dickens: LIMA to LAD, SVG to RCA, SVG to OM3, EVH  . ESOPHAGOGASTRODUODENOSCOPY (EGD) WITH PROPOFOL N/A 10/24/2016   Procedure: ESOPHAGOGASTRODUODENOSCOPY (EGD) WITH PROPOFOL;  Surgeon: [name], MD;  Location: [facility] ENDOSCOPY;  Service: Endoscopy;  Laterality: N/A;  . EYE SURGERY Bilateral    cataract surgery  . INSERTION EXPRESS TUBE SHUNT Right 08/01/2016   Procedure: INSERTION EXPRESS TUBE SHUNT;  Surgeon: Eulogio Bear, MD;  Location: [facility] ORS;  Service: Ophthalmology;  Laterality: Right;  . INSERTION OF AHMED VALVE Left 08/15/2016   Procedure: INSERTION OF AHMED VALVE;  Surgeon: Eulogio Bear, MD;  Location: [facility] ORS;  Service: Ophthalmology;  Laterality: Left;  . INSERTION OF DIALYSIS CATHETER    . TRANSTHORACIC ECHOCARDIOGRAM  January 2017    EF 60-65%. GR 2 DD. Mild degenerative mitral valve disease but no prolapse or regurgitation. Mild left atrial dilation. Mild to moderate LVH. Pericardial effusion gone  . UPPER EXTREMITY ANGIOGRAPHY Left 06/27/2016   Procedure: Upper Extremity Angiography;  Surgeon: Algernon Huxley, MD;  Location: [place] CV LAB;  Service: Cardiovascular;  Laterality: Left;  . UPPER EXTREMITY ANGIOGRAPHY Left 08/22/2016   Procedure: Upper Extremity Angiography;  Surgeon: Algernon Huxley, MD;  Location: [place] CV [facility];   Service: Cardiovascular;  Laterality: Left;    Social History Social History   Tobacco Use  . Smoking status: Never Smoker  . Smokeless tobacco: Never Used  Substance Use Topics  . Alcohol use: No  . Drug use: No     Family History Family History  Problem Relation Age of Onset  . Diabetes Mellitus II Mother   . Pancreatic cancer Father    NO bleeding disorders or clotting disorders  Allergies  Allergen Reactions  . Chlorthalidone Anaphylaxis, Itching and Rash  . Fentanyl Rash  . Midazolam  Rash  . Ace Inhibitors Other (See Comments)    Reaction:  Hyperkalemia, agitation   . Angiotensin Receptor Blockers Other (See Comments)    Reaction:  Hyperkalemia, agitation   . Norvasc [Amlodipine] Itching and Rash  . Phenergan [Promethazine Hcl] Anxiety    "antsy, can't sit still"     REVIEW OF SYSTEMS (Negative unless checked)  Constitutional: [] Weight loss  [] Fever  [] Chills Cardiac: [] Chest pain   [] Chest pressure   [] Palpitations   [] Shortness of breath when laying flat   [] Shortness of breath at rest   [] Shortness of breath with exertion. Vascular:  [] Pain in legs with walking   [] Pain in legs at rest   [] Pain in legs when laying flat   [] Claudication   [] Pain in feet when walking  [] Pain in feet at rest  [] Pain in feet when laying flat   [] History of DVT   [] Phlebitis   [] Swelling in legs   [] Varicose veins   [] Non-healing ulcers Pulmonary:   [] Uses home oxygen   [] Productive cough   [] Hemoptysis   [] Wheeze  [] COPD   [] Asthma Neurologic:  [] Dizziness  [] Blackouts   [] Seizures   [] History of stroke   [] History of TIA  [] Aphasia   [] Temporary blindness   [] Dysphagia   [x] Weakness or numbness in arms   [] Weakness or numbness in legs Musculoskeletal:  [x] Arthritis   [] Joint swelling   [] Joint pain   [] Low back pain Hematologic:  [x] Easy bruising  [] Easy bleeding   [] Hypercoagulable state   [x] Anemic   Gastrointestinal:  [] Blood in stool   [] Vomiting blood  [] Gastroesophageal  reflux/heartburn   [] Abdominal pain Genitourinary:  [x] Chronic kidney disease   [] Difficult urination  [] Frequent urination  [] Burning with urination   [] Hematuria Skin:  [] Rashes   [] Ulcers   [] Wounds Psychological:  [] History of anxiety   []  History of major depression.  Physical Examination  BP (!) 118/59 (BP Location: Right Arm, Patient Position: Sitting)   Pulse 84   Resp 14   Ht 5' 4"  (1.626 m)   Wt 59.4 kg (131 lb)   BMI 22.49 kg/m  Gen:  WD/WN, NAD.  Somewhat frail-appearing Head: /AT, No temporalis wasting. Ear/Nose/Throat: Hearing grossly intact, nares w/o erythema or drainage, trachea midline Eyes: Conjunctiva clear. Sclera non-icteric Neck: Supple.  No JVD.  Pulmonary:  Good air movement, no use of accessory muscles.  Cardiac: Irregular Vascular: Right jugular PermCath in place Vessel Right Left  Radial Palpable  1+ palpable                                    Musculoskeletal: M/S 5/5 throughout.  No deformity or atrophy.  Neurologic: Sensation grossly intact in extremities.  Symmetrical.  Speech is fluent.  Psychiatric: Judgment intact, Mood & affect appropriate for pt's clinical situation. Dermatologic: No rashes or ulcers noted.  No cellulitis or open wounds.       Labs No results found for this or any previous visit (from the past 2160 hour(s)).  Radiology No results found.    Assessment/Plan Hypertension blood pressure control important in reducing the progression of atherosclerotic disease. On appropriate oral medications.   Type II diabetes mellitus with complication (HCC) blood glucose control important in reducing the progression of atherosclerotic disease. Also, involved in wound healing. On appropriate medications.   ESRD (end stage renal disease) on dialysis [facility]) The patient has a somewhat difficult dialysis access situation.  She has  been catheter dependent for quite a bit of time.  She reports being very fearful to place  anything in her right arm all the best access option.  She wants to consider an upper arm AV graft on the left and I have told her I can do that, but the risk of steal on that side is pretty high.  She wants to go home and think about things with her family which is reasonable.  She is going to call our office with her decision on what type of access she would like to have placed.    Leotis Pain, MD  04/29/2017 11:50 AM    This note was created with Dragon medical transcription system.  Any errors from dictation are purely unintentional

## 2017-05-20 ENCOUNTER — Ambulatory Visit: Payer: Medicare Other

## 2017-05-20 ENCOUNTER — Ambulatory Visit (INDEPENDENT_AMBULATORY_CARE_PROVIDER_SITE_OTHER): Payer: Medicare Other | Admitting: Podiatry

## 2017-05-20 ENCOUNTER — Encounter: Payer: Self-pay | Admitting: Podiatry

## 2017-05-20 DIAGNOSIS — E0843 Diabetes mellitus due to underlying condition with diabetic autonomic (poly)neuropathy: Secondary | ICD-10-CM

## 2017-05-20 DIAGNOSIS — L97509 Non-pressure chronic ulcer of other part of unspecified foot with unspecified severity: Principal | ICD-10-CM

## 2017-05-20 DIAGNOSIS — I70235 Atherosclerosis of native arteries of right leg with ulceration of other part of foot: Secondary | ICD-10-CM | POA: Diagnosis not present

## 2017-05-20 DIAGNOSIS — L97512 Non-pressure chronic ulcer of other part of right foot with fat layer exposed: Secondary | ICD-10-CM | POA: Diagnosis not present

## 2017-05-20 DIAGNOSIS — E11621 Type 2 diabetes mellitus with foot ulcer: Secondary | ICD-10-CM

## 2017-05-20 MED ORDER — GENTAMICIN SULFATE 0.1 % EX CREA: 1.0000 "application " | TOPICAL_CREAM | Freq: Three times a day (TID) | CUTANEOUS | 1 refills | Status: DC

## 2017-05-20 NOTE — Progress Notes (Signed)
   Subjective:    Patient ID: Stephanie Ellis, female    DOB: April 03, 1951, 67 y.o.   MRN: [id number]  HPI    Review of Systems     Objective:   Physical Exam        Assessment & Plan:

## 2017-05-22 NOTE — Progress Notes (Signed)
Subjective:  67 year old female presenting today as a new patient with a chief complaint of a painful lesion to the right great toe that appeared about one month ago. She reports associated neuropathy of the toe as well. She describes the lesion as a dry, hard blister. She has been applying peroxide and Vaseline to the area with no significant relief. Patient is here for further evaluation and treatment.   Past Medical History:  Diagnosis Date  . Anemia   . Anorexia   . Bacteremia due to Pseudomonas   . CHF (congestive heart failure) (HCC)    has not happened recently  . Complication of anesthesia 01/2016   WITH LAST STENT 10/17. PASsED OUT AND HAD TO BE AWAKENED  . Coronary artery disease involving left main coronary artery 01/2015   [facility]: 70% LM, p-mLAD 50-60% (Resting FFR 0.75), mRCA 80-90%, ~40 Ost OM & D1  . Dysrhythmia   . ESRD (end stage renal disease) on dialysis [facility])    ESRD secondary to acute kidney failure s/p CABG  DIALYSIS M/W/F  . Essential hypertension   . GERD (gastroesophageal reflux disease)   . Heart murmur    no treatment  . Hypercholesterolemia   . Myocardial infarction ([place]) 2016  . 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 Marland Kitchen Haithcock: CABG x 3, LIMA to LAD, SVG to RCA, SVG to OM3, EVH  . Type II diabetes mellitus with complication (HCC)    CAD      Objective/Physical Exam General: The patient is alert and oriented x3 in no acute distress.  Dermatology:  Wound #1 noted to the right great toe measuring 2.5 x 1.5 x 0.3 cm (LxWxD).   To the noted ulceration(s), there is no eschar. There is a moderate amount of slough, fibrin, and necrotic tissue noted. Granulation tissue and wound base is red. There is a minimal amount of serosanguineous drainage noted. There is no exposed bone muscle-tendon ligament or joint. There is no malodor. Periwound integrity is intact. Skin is warm, dry and supple bilateral lower extremities.  Vascular: Palpable pedal pulses  bilaterally. No edema or erythema noted. Capillary refill within normal limits.  Neurological: Epicritic and protective threshold absent bilaterally.   Musculoskeletal Exam: Range of motion within normal limits to all pedal and ankle joints bilateral. Muscle strength 5/5 in all groups bilateral.   Assessment: #1 ulceration right great toe secondary to diabetes mellitus #2 diabetes mellitus w/ peripheral neuropathy   Plan of Care:  #1 Patient was evaluated. #2 medically necessary excisional debridement including muscle and deep fascial tissue was performed using a tissue nipper and a chisel blade. Excisional debridement of all the necrotic nonviable tissue down to healthy bleeding viable tissue was performed with post-debridement measurements same as pre-. #3 The wound was cleansed and dry sterile dressing applied. #4 Prescription for Gentamicin cream provided to patient to be used daily with dry sterile dressing.  #5 Post op shoe dispensed.  #6 Return to clinic in 3 weeks.  Edrick Kins, DPM [facility]  Dr. Edrick Kins, [address]                                        [place], [place] [address]                Office [phone]  Fax (  [phone]      

## 2017-06-10 ENCOUNTER — Ambulatory Visit: Payer: Medicare Other | Admitting: Podiatry

## 2017-06-17 DIAGNOSIS — G629 Polyneuropathy, unspecified: Secondary | ICD-10-CM | POA: Insufficient documentation

## 2017-07-01 ENCOUNTER — Encounter: Payer: Self-pay | Admitting: Podiatry

## 2017-07-01 ENCOUNTER — Ambulatory Visit (INDEPENDENT_AMBULATORY_CARE_PROVIDER_SITE_OTHER): Payer: Medicare Other | Admitting: Podiatry

## 2017-07-01 DIAGNOSIS — L97512 Non-pressure chronic ulcer of other part of right foot with fat layer exposed: Secondary | ICD-10-CM

## 2017-07-01 DIAGNOSIS — I70235 Atherosclerosis of native arteries of right leg with ulceration of other part of foot: Secondary | ICD-10-CM

## 2017-07-01 DIAGNOSIS — E0843 Diabetes mellitus due to underlying condition with diabetic autonomic (poly)neuropathy: Secondary | ICD-10-CM

## 2017-07-02 NOTE — Progress Notes (Signed)
   Subjective:  67 year old female presenting today for follow up evaluation of an ulceration to the right great toe. She states the wound is improving. She reports some continued soreness to the touch. She presents today and is not wearing the post op shoe as directed. Patient is here for further evaluation and treatment.   Past Medical History:  Diagnosis Date  . Anemia   . Anorexia   . Bacteremia due to Pseudomonas   . CHF (congestive heart failure) (HCC)    has not happened recently  . Complication of anesthesia 01/2016   WITH LAST STENT 10/17. PASsED OUT AND HAD TO BE AWAKENED  . Coronary artery disease involving left main coronary artery 01/2015   [facility]: 70% LM, p-mLAD 50-60% (Resting FFR 0.75), mRCA 80-90%, ~40 Ost OM & D1  . Dysrhythmia   . ESRD (end stage renal disease) on dialysis [facility])    ESRD secondary to acute kidney failure s/p CABG  DIALYSIS M/W/F  . Essential hypertension   . GERD (gastroesophageal reflux disease)   . Heart murmur    no treatment  . Hypercholesterolemia   . Myocardial infarction ([place]) 2016  . 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 Marland Kitchen Haithcock: CABG x 3, LIMA to LAD, SVG to RCA, SVG to OM3, EVH  . Type II diabetes mellitus with complication (HCC)    CAD      Objective/Physical Exam General: The patient is alert and oriented x3 in no acute distress.  Dermatology:  Wound #1 noted to the right great toe measuring 2.0 x 0.9 x 0.2 cm (LxWxD).   To the noted ulceration(s), there is no eschar. There is a moderate amount of slough, fibrin, and necrotic tissue noted. Granulation tissue and wound base is red. There is a minimal amount of serosanguineous drainage noted. There is no exposed bone muscle-tendon ligament or joint. There is no malodor. Periwound integrity is intact. Skin is warm, dry and supple bilateral lower extremities.  Vascular: Palpable pedal pulses bilaterally. No edema or erythema noted. Capillary refill within normal  limits.  Neurological: Epicritic and protective threshold absent bilaterally.   Musculoskeletal Exam: Range of motion within normal limits to all pedal and ankle joints bilateral. Muscle strength 5/5 in all groups bilateral.   Assessment: #1 ulceration right great toe secondary to diabetes mellitus #2 diabetes mellitus w/ peripheral neuropathy   Plan of Care:  #1 Patient was evaluated. #2 medically necessary excisional debridement including muscle and deep fascial tissue was performed using a tissue nipper and a chisel blade. Excisional debridement of all the necrotic nonviable tissue down to healthy bleeding viable tissue was performed with post-debridement measurements same as pre-. #3 The wound was cleansed and dry sterile dressing applied. #4 Continue using Gentamicin cream daily with a Band-Aid.  #5 resume wearing post op shoe weightbearing as tolerated.  #6 Return to clinic in 3 weeks.  Edrick Kins, DPM [facility]  Dr. Edrick Kins, [address]                                        [place], [place] [address]                Office [phone]  Fax [phone]

## 2017-07-22 ENCOUNTER — Ambulatory Visit (INDEPENDENT_AMBULATORY_CARE_PROVIDER_SITE_OTHER): Payer: Medicare Other | Admitting: Podiatry

## 2017-07-22 ENCOUNTER — Encounter: Payer: Self-pay | Admitting: Podiatry

## 2017-07-22 DIAGNOSIS — I70235 Atherosclerosis of native arteries of right leg with ulceration of other part of foot: Secondary | ICD-10-CM | POA: Diagnosis not present

## 2017-07-22 DIAGNOSIS — L97522 Non-pressure chronic ulcer of other part of left foot with fat layer exposed: Secondary | ICD-10-CM

## 2017-07-22 DIAGNOSIS — E0843 Diabetes mellitus due to underlying condition with diabetic autonomic (poly)neuropathy: Secondary | ICD-10-CM

## 2017-07-22 DIAGNOSIS — L97512 Non-pressure chronic ulcer of other part of right foot with fat layer exposed: Secondary | ICD-10-CM

## 2017-07-24 NOTE — Progress Notes (Signed)
Subjective:  67 year old female presenting today for follow up evaluation of an ulceration of the right great toe. She states she is doing well and the wounds are slowly healing. She reports a new blister to the plantar aspect of the left great toe that she noticed about 2-3 weeks ago. She has been applying gentamicin cream to the wounds. She denies any pain. Patient is here for further evaluation and treatment.   Past Medical History:  Diagnosis Date  . Anemia   . Anorexia   . Bacteremia due to Pseudomonas   . CHF (congestive heart failure) (HCC)    has not happened recently  . Complication of anesthesia 01/2016   WITH LAST STENT 10/17. PASsED OUT AND HAD TO BE AWAKENED  . Coronary artery disease involving left main coronary artery 01/2015   [facility]: 70% LM, p-mLAD 50-60% (Resting FFR 0.75), mRCA 80-90%, ~40 Ost OM & D1  . Dysrhythmia   . ESRD (end stage renal disease) on dialysis [facility])    ESRD secondary to acute kidney failure s/p CABG  DIALYSIS M/W/F  . Essential hypertension   . GERD (gastroesophageal reflux disease)   . Heart murmur    no treatment  . Hypercholesterolemia   . Myocardial infarction ([place]) 2016  . 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 Marland Kitchen Haithcock: CABG x 3, LIMA to LAD, SVG to RCA, SVG to OM3, EVH  . Type II diabetes mellitus with complication (HCC)    CAD      Objective/Physical Exam General: The patient is alert and oriented x3 in no acute distress.  Dermatology:  Wound #1 noted to the right great toe measuring 0.5 x 0.5 x 0.2 cm (LxWxD).   Wound #2 noted to the left great toe measuring 2.0 x 1.5 x 0.2 cm.  To the noted ulceration(s), there is no eschar. There is a moderate amount of slough, fibrin, and necrotic tissue noted. Granulation tissue and wound base is red. There is a minimal amount of serosanguineous drainage noted. There is no exposed bone muscle-tendon ligament or joint. There is no malodor. Periwound integrity is intact. Skin is warm,  dry and supple bilateral lower extremities.  Vascular: Palpable pedal pulses bilaterally. No edema or erythema noted. Capillary refill within normal limits.  Neurological: Epicritic and protective threshold absent bilaterally.   Musculoskeletal Exam: Range of motion within normal limits to all pedal and ankle joints bilateral. Muscle strength 5/5 in all groups bilateral.   Assessment: #1 ulceration bilateral great toes secondary to diabetes mellitus #2 diabetes mellitus w/ peripheral neuropathy   Plan of Care:  #1 Patient was evaluated. #2 medically necessary excisional debridement including muscle and deep fascial tissue was performed using a tissue nipper and a chisel blade. Excisional debridement of all the necrotic nonviable tissue down to healthy bleeding viable tissue was performed with post-debridement measurements same as pre-. #3 The wounds were cleansed and dry sterile dressing applied. #4 Continue using Gentamicin cream daily with a dry sterile dressing.  #5 Appointment with Liliane Channel for DM shoes and insoles.  #6 Continue wearing post op shoe on the left foot.  #7 Return to clinic in 3 weeks.   Edrick Kins, DPM [facility]  Dr. Edrick Kins, DPM    [address]  [place], [place] [address]                Office [phone]  Fax [phone]

## 2017-08-12 ENCOUNTER — Ambulatory Visit: Payer: Medicare Other | Admitting: Podiatry

## 2017-08-15 ENCOUNTER — Other Ambulatory Visit: Payer: Self-pay | Admitting: Podiatry

## 2017-08-19 ENCOUNTER — Ambulatory Visit: Payer: Medicare Other | Admitting: Podiatry

## 2017-09-02 ENCOUNTER — Ambulatory Visit (INDEPENDENT_AMBULATORY_CARE_PROVIDER_SITE_OTHER): Payer: Medicare Other | Admitting: Vascular Surgery

## 2017-09-03 ENCOUNTER — Telehealth (INDEPENDENT_AMBULATORY_CARE_PROVIDER_SITE_OTHER): Payer: Self-pay | Admitting: Vascular Surgery

## 2017-09-03 NOTE — Telephone Encounter (Signed)
Okay it's noted that the patient does not want to come in tomorrow. Please go ahead a cancel this appointment.

## 2017-09-03 NOTE — Telephone Encounter (Signed)
Carrie at [facility] called and said to cancel the appointment for this patient that is scheduled in the morning. Patient catheter is running good now and patient doesnt want to come,. [facility] [phone].

## 2017-09-04 ENCOUNTER — Encounter: Admission: RE | Payer: Self-pay | Source: Ambulatory Visit

## 2017-09-04 ENCOUNTER — Other Ambulatory Visit (INDEPENDENT_AMBULATORY_CARE_PROVIDER_SITE_OTHER): Payer: Self-pay | Admitting: Vascular Surgery

## 2017-09-04 ENCOUNTER — Ambulatory Visit: Admission: RE | Admit: 2017-09-04 | Payer: Medicare Other | Source: Ambulatory Visit | Admitting: Vascular Surgery

## 2017-09-04 SURGERY — DIALYSIS/PERMA CATHETER INSERTION
Anesthesia: Moderate Sedation

## 2017-09-16 ENCOUNTER — Encounter: Payer: Self-pay | Admitting: Podiatry

## 2017-09-16 ENCOUNTER — Ambulatory Visit (INDEPENDENT_AMBULATORY_CARE_PROVIDER_SITE_OTHER): Payer: Medicare Other | Admitting: Podiatry

## 2017-09-16 DIAGNOSIS — L97512 Non-pressure chronic ulcer of other part of right foot with fat layer exposed: Secondary | ICD-10-CM

## 2017-09-16 DIAGNOSIS — E0843 Diabetes mellitus due to underlying condition with diabetic autonomic (poly)neuropathy: Secondary | ICD-10-CM

## 2017-09-16 DIAGNOSIS — I70235 Atherosclerosis of native arteries of right leg with ulceration of other part of foot: Secondary | ICD-10-CM

## 2017-09-16 MED ORDER — DOXYCYCLINE HYCLATE 100 MG PO TABS: 100.0000 mg | ORAL_TABLET | Freq: Two times a day (BID) | ORAL | 0 refills | Status: DC

## 2017-09-17 NOTE — Progress Notes (Signed)
Subjective:  67 year old female presenting today for follow up evaluation of an ulceration of bilateral great toes. She states the right hallux is improving while the left hallux looks about the same. There are no modifying factors noted. She denies pain. Patient is here for further evaluation and treatment.   Past Medical History:  Diagnosis Date  . Anemia   . Anorexia   . Bacteremia due to Pseudomonas   . CHF (congestive heart failure) (HCC)    has not happened recently  . Complication of anesthesia 01/2016   WITH LAST STENT 10/17. PASsED OUT AND HAD TO BE AWAKENED  . Coronary artery disease involving left main coronary artery 01/2015   [facility]: 70% LM, p-mLAD 50-60% (Resting FFR 0.75), mRCA 80-90%, ~40 Ost OM & D1  . Dysrhythmia   . ESRD (end stage renal disease) on dialysis [facility])    ESRD secondary to acute kidney failure s/p CABG  DIALYSIS M/W/F  . Essential hypertension   . GERD (gastroesophageal reflux disease)   . Heart murmur    no treatment  . Hypercholesterolemia   . Myocardial infarction ([place]) 2016  . 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 Marland Kitchen Haithcock: CABG x 3, LIMA to LAD, SVG to RCA, SVG to OM3, EVH  . Type II diabetes mellitus with complication (HCC)    CAD      Objective/Physical Exam General: The patient is alert and oriented x3 in no acute distress.  Dermatology:  Wound #1 noted to the right great toe measuring 0.5 x 0.5 x 0.2 cm (LxWxD).   Wound #2 noted to the left great toe measuring 2.0 x 1.5 x 0.2 cm.  To the noted ulceration(s), there is no eschar. There is a moderate amount of slough, fibrin, and necrotic tissue noted. Granulation tissue and wound base is red. There is a minimal amount of serosanguineous drainage noted. There is no exposed bone muscle-tendon ligament or joint. There is malodor noted. Periwound integrity is intact. Skin is warm, dry and supple bilateral lower extremities.  Vascular: diminished pedal pulses of the LLE. No edema or  erythema noted. Capillary refill within normal limits.  Neurological: Epicritic and protective threshold absent bilaterally.   Musculoskeletal Exam: Range of motion within normal limits to all pedal and ankle joints bilateral. Muscle strength 5/5 in all groups bilateral.   Assessment: #1 ulceration bilateral great toes secondary to diabetes mellitus #2 diabetes mellitus w/ peripheral neuropathy   Plan of Care:  #1 Patient was evaluated. #2 medically necessary excisional debridement including muscle and deep fascial tissue was performed using a tissue nipper and a chisel blade. Excisional debridement of all the necrotic nonviable tissue down to healthy bleeding viable tissue was performed with post-debridement measurements same as pre-. #3 The wounds were cleansed and dry sterile dressing applied. #4 Culture taken from wound.  #5 Prescription for Doxycycline 100 mg #20 provided to patient.  #6 Recommended Iodine and dry sterile dressing daily.  #7 Referral for Vascular consult. Arterial doppler ordered.  #8 Return to clinic in 3 weeks.    Edrick Kins, DPM [facility]  Dr. Edrick Kins, [place]                                        [place], [place] [address]  Office [phone]  Fax [phone]

## 2017-09-19 LAB — WOUND CULTURE

## 2017-09-27 ENCOUNTER — Other Ambulatory Visit: Payer: Self-pay

## 2017-09-27 ENCOUNTER — Emergency Department: Payer: Medicare Other

## 2017-09-27 ENCOUNTER — Emergency Department
Admission: EM | Admit: 2017-09-27 | Discharge: 2017-09-27 | Disposition: A | Discharge status: Home / Self Care | Payer: Medicare Other | Attending: Emergency Medicine | Admitting: Emergency Medicine

## 2017-09-27 DIAGNOSIS — Z951 Presence of aortocoronary bypass graft: Secondary | ICD-10-CM | POA: Diagnosis not present

## 2017-09-27 DIAGNOSIS — N186 End stage renal disease: Secondary | ICD-10-CM | POA: Insufficient documentation

## 2017-09-27 DIAGNOSIS — Z7902 Long term (current) use of antithrombotics/antiplatelets: Secondary | ICD-10-CM | POA: Diagnosis not present

## 2017-09-27 DIAGNOSIS — Z7982 Long term (current) use of aspirin: Secondary | ICD-10-CM | POA: Insufficient documentation

## 2017-09-27 DIAGNOSIS — Z79899 Other long term (current) drug therapy: Secondary | ICD-10-CM | POA: Diagnosis not present

## 2017-09-27 DIAGNOSIS — Z992 Dependence on renal dialysis: Secondary | ICD-10-CM | POA: Diagnosis not present

## 2017-09-27 DIAGNOSIS — I252 Old myocardial infarction: Secondary | ICD-10-CM | POA: Diagnosis not present

## 2017-09-27 DIAGNOSIS — Z794 Long term (current) use of insulin: Secondary | ICD-10-CM | POA: Insufficient documentation

## 2017-09-27 DIAGNOSIS — I5032 Chronic diastolic (congestive) heart failure: Secondary | ICD-10-CM | POA: Insufficient documentation

## 2017-09-27 DIAGNOSIS — E1122 Type 2 diabetes mellitus with diabetic chronic kidney disease: Secondary | ICD-10-CM | POA: Diagnosis not present

## 2017-09-27 DIAGNOSIS — I2510 Atherosclerotic heart disease of native coronary artery without angina pectoris: Secondary | ICD-10-CM | POA: Diagnosis not present

## 2017-09-27 DIAGNOSIS — H9311 Tinnitus, right ear: Secondary | ICD-10-CM | POA: Diagnosis not present

## 2017-09-27 DIAGNOSIS — I132 Hypertensive heart and chronic kidney disease with heart failure and with stage 5 chronic kidney disease, or end stage renal disease: Secondary | ICD-10-CM | POA: Insufficient documentation

## 2017-09-27 DIAGNOSIS — R1010 Upper abdominal pain, unspecified: Secondary | ICD-10-CM | POA: Diagnosis present

## 2017-09-27 DIAGNOSIS — R42 Dizziness and giddiness: Secondary | ICD-10-CM | POA: Insufficient documentation

## 2017-09-27 LAB — TROPONIN I: Troponin I: 0.06 ng/mL (ref <0.03)

## 2017-09-27 LAB — CBC WITH DIFFERENTIAL/PLATELET
Basophils Absolute: 0.1 10*3/uL (ref 0–0.1)
Basophils Relative: 1 %
Eosinophils Absolute: 0.1 10*3/uL (ref 0–0.7)
Eosinophils Relative: 2 %
HCT: 33.8 % — ABNORMAL LOW (ref 35.0–47.0)
Hemoglobin: 11.4 g/dL — ABNORMAL LOW (ref 12.0–16.0)
Lymphocytes Relative: 13 %
Lymphs Abs: 1 10*3/uL (ref 1.0–3.6)
MCH: 32.8 pg (ref 26.0–34.0)
MCHC: 33.6 g/dL (ref 32.0–36.0)
MCV: 97.5 fL (ref 80.0–100.0)
Monocytes Absolute: 0.6 10*3/uL (ref 0.2–0.9)
Monocytes Relative: 8 %
Neutro Abs: 6.2 10*3/uL (ref 1.4–6.5)
Neutrophils Relative %: 76 %
Platelets: 237 10*3/uL (ref 150–440)
RBC: 3.46 MIL/uL — ABNORMAL LOW (ref 3.80–5.20)
RDW: 16.8 % — ABNORMAL HIGH (ref 11.5–14.5)
WBC: 8 10*3/uL (ref 3.6–11.0)

## 2017-09-27 LAB — COMPREHENSIVE METABOLIC PANEL
ALT: 12 U/L — ABNORMAL LOW (ref 14–54)
AST: 20 U/L (ref 15–41)
Albumin: 3.7 g/dL (ref 3.5–5.0)
Alkaline Phosphatase: 94 U/L (ref 38–126)
Anion gap: 13 (ref 5–15)
BUN: 41 mg/dL — ABNORMAL HIGH (ref 6–20)
CO2: 27 mmol/L (ref 22–32)
Calcium: 9.5 mg/dL (ref 8.9–10.3)
Chloride: 95 mmol/L — ABNORMAL LOW (ref 101–111)
Creatinine, Ser: 4.42 mg/dL — ABNORMAL HIGH (ref 0.44–1.00)
GFR calc Af Amer: 11 mL/min — ABNORMAL LOW (ref >60)
GFR calc non Af Amer: 10 mL/min — ABNORMAL LOW (ref >60)
Glucose, Bld: 134 mg/dL — ABNORMAL HIGH (ref 65–99)
Potassium: 4 mmol/L (ref 3.5–5.1)
Sodium: 135 mmol/L (ref 135–145)
Total Bilirubin: 0.5 mg/dL (ref 0.3–1.2)
Total Protein: 8.1 g/dL (ref 6.5–8.1)

## 2017-09-27 LAB — LIPASE, BLOOD: Lipase: 46 U/L (ref 11–51)

## 2017-09-27 MED ORDER — GI COCKTAIL ~~LOC~~
30.0000 mL | Freq: Once | ORAL | Status: AC
  Administered 2017-09-27: 30 mL via ORAL
  Filled 2017-09-27: qty 30

## 2017-09-27 MED ORDER — FAMOTIDINE 20 MG PO TABS
20.0000 mg | ORAL_TABLET | Freq: Once | ORAL | Status: AC
  Administered 2017-09-27: 20 mg via ORAL
  Filled 2017-09-27: qty 1

## 2017-09-27 MED ORDER — DICYCLOMINE HCL 10 MG PO CAPS
20.0000 mg | ORAL_CAPSULE | Freq: Once | ORAL | Status: AC
  Administered 2017-09-27: 20 mg via ORAL
  Filled 2017-09-27: qty 2

## 2017-09-27 MED ORDER — RANITIDINE HCL 300 MG PO TABS: 300.0000 mg | ORAL_TABLET | Freq: Every day | ORAL | 0 refills | Status: DC

## 2017-09-27 NOTE — ED Provider Notes (Signed)
[facility] Emergency Department Provider Note  ____________________________________________   First MD Initiated Contact with Patient 09/27/17 1343     (approximate)  I have reviewed the triage vital signs and the nursing notes.   HISTORY  Chief Complaint Tinnitus and Nausea   HPI Stephanie Ellis is a 67 y.o. female who self presents to the emergency department with multiple issues.  First she reports roughly 1 week of mild to moderate upper abdominal discomfort along with nausea.  Her symptoms seem to be worse in the morning and are associated with a foul and bitter taste in her mouth.  She has no morning cough.  Second she also reports several weeks of tinnitus on her right side and intermittent "dizziness".  She denies vertigo.  She does note some sinus congestion.  She denies fevers or chills.  She denies nasal discharge.  She denies ear pain.  Her dizziness is mostly constant.  Nothing particular seems to make it better or worse.  It is associated with mild nausea.  No vomiting.  Her abdominal pain seems to radiate from her upper abdomen towards her lower chest.  Past Medical History:  Diagnosis Date  . Anemia   . Anorexia   . Bacteremia due to Pseudomonas   . CHF (congestive heart failure) (HCC)    has not happened recently  . Complication of anesthesia 01/2016   WITH LAST STENT 10/17. PASsED OUT AND HAD TO BE AWAKENED  . Coronary artery disease involving left main coronary artery 01/2015   [facility]: 70% LM, p-mLAD 50-60% (Resting FFR 0.75), mRCA 80-90%, ~40 Ost OM & D1  . Dysrhythmia   . ESRD (end stage renal disease) on dialysis [facility])    ESRD secondary to acute kidney failure s/p CABG  DIALYSIS M/W/F  . Essential hypertension   . GERD (gastroesophageal reflux disease)   . Heart murmur    no treatment  . Hypercholesterolemia   . Myocardial infarction ([place]) 2016  . 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 Marland Kitchen Haithcock: CABG x 3, LIMA to LAD, SVG to  RCA, SVG to OM3, EVH  . Type II diabetes mellitus with complication [facility])    CAD    Patient Active Problem List   Diagnosis Date Noted  . Encephalopathy 02/06/2017  . Hypertensive emergency 02/06/2017  . Endophthalmitis, left eye 12/05/2016  . Vomiting 10/24/2016  . Hematuria 10/17/2016  . Acute lower UTI 10/17/2016  . Anesthesia complication 11/57/2620  . Chest pain with moderate risk for cardiac etiology 09/04/2016  . Steal syndrome dialysis vascular access, initial encounter ([place]) 06/18/2016  . 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 Coronary artery disease involving coronary bypass graft of native heart with angina pectoris ([place]) 02/10/2016  . Hypertension 02/02/2016  . Hyperlipidemia 02/02/2016  . Coronary artery disease involving left main coronary artery 10/04/2015  . Abdominal pain of unknown etiology 10/04/2015  . ESRD (end stage renal disease) on dialysis ([place])   . Type II diabetes mellitus with complication ([place])   . NSTEMI (non-ST elevated myocardial infarction) ([place])   . Right sided abdominal pain   . Elevated troponin 10/03/2015  . 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 Chronic diastolic congestive heart failure ([place]) 04/19/2014  . Stage 5 chronic kidney disease on chronic dialysis ([place]) 04/19/2014  . Anemia 09/20/2013  . Routine health maintenance 01/29/2013    Past Surgical History:  Procedure Laterality Date  . ABDOMINAL HYSTERECTOMY    . ARTERIOVENOUS GRAFT PLACEMENT  05/2016  . AV FISTULA PLACEMENT Left 05/30/2016   Procedure: ARTERIOVENOUS graft;  Surgeon: Algernon Huxley, MD;  Location: [facility] ORS;  Service: Vascular;  Laterality: Left;  . CARDIAC CATHETERIZATION  01/2015   [facility]: Ost LM 70%, p-m LAD 50-60% (Rest FFR + @ 0.75), mRCA 80-90%, ostD1 40%, pOM1 40%  . CATARACT EXTRACTION W/PHACO Left 01/18/2016   Procedure: CATARACT EXTRACTION PHACO AND INTRAOCULAR LENS PLACEMENT (IOC);  Surgeon: Eulogio Bear, MD;  Location: [facility] ORS;  Service:  Ophthalmology;  Laterality: Left;  [place] 1.05 AP% 15.5 CDE 10.16 Fluid Pack Lot # [id number]  . CATARACT EXTRACTION W/PHACO Right 08/01/2016   Procedure: CATARACT EXTRACTION PHACO AND INTRAOCULAR LENS PLACEMENT (IOC);  Surgeon: Eulogio Bear, MD;  Location: [facility] ORS;  Service: Ophthalmology;  Laterality: Right;  [place] 01:00.6 AP% 11.4 CDE 6.93  LOT # [id number]  . COLONOSCOPY    . CORONARY ANGIOPLASTY     SENTS 02/12/16  . CORONARY ARTERY BYPASS GRAFT  03/28/15    [facility]: Dr. Waldemar Dickens: LIMA to LAD, SVG to RCA, SVG to OM3, EVH  . ESOPHAGOGASTRODUODENOSCOPY (EGD) WITH PROPOFOL N/A 10/24/2016   Procedure: ESOPHAGOGASTRODUODENOSCOPY (EGD) WITH PROPOFOL;  Surgeon: [name], MD;  Location: [facility] ENDOSCOPY;  Service: Endoscopy;  Laterality: N/A;  . EYE SURGERY Bilateral    cataract surgery  . INSERTION EXPRESS TUBE SHUNT Right 08/01/2016   Procedure: INSERTION EXPRESS TUBE SHUNT;  Surgeon: Eulogio Bear, MD;  Location: [facility] ORS;  Service: Ophthalmology;  Laterality: Right;  . INSERTION OF AHMED VALVE Left 08/15/2016   Procedure: INSERTION OF AHMED VALVE;  Surgeon: Eulogio Bear, MD;  Location: [facility] ORS;  Service: Ophthalmology;  Laterality: Left;  . INSERTION OF DIALYSIS CATHETER    . TRANSTHORACIC ECHOCARDIOGRAM  January 2017    EF 60-65%. GR 2 DD. Mild degenerative mitral valve disease but no prolapse or regurgitation. Mild left atrial dilation. Mild to moderate LVH. Pericardial effusion gone  . UPPER EXTREMITY ANGIOGRAPHY Left 06/27/2016   Procedure: Upper Extremity Angiography;  Surgeon: Algernon Huxley, MD;  Location: [place] CV LAB;  Service: Cardiovascular;  Laterality: Left;  . UPPER EXTREMITY ANGIOGRAPHY Left 08/22/2016   Procedure: Upper Extremity Angiography;  Surgeon: Algernon Huxley, MD;  Location: [place] CV LAB;  Service: Cardiovascular;  Laterality: Left;    Prior to Admission medications   Medication Sig Start Date End Date Taking? Authorizing Provider  aspirin EC 81 MG  tablet Take 1 tablet (81 mg total) by mouth daily. 10/19/16   Dustin Flock, MD  atorvastatin (LIPITOR) 40 MG tablet Take 40 mg by mouth daily at 6 PM.     [provider]  atorvastatin (LIPITOR) 80 MG tablet  05/05/17   [provider]  ATROPINE SULFATE-NACL OP Apply 1 drop to eye 2 (two) times daily. Left eye only    [provider]  carvedilol (COREG) 3.125 MG tablet Take 3.125 mg by mouth. 03/06/17 03/01/18  [provider]  cholecalciferol (VITAMIN D) 400 units TABS tablet Take by mouth.    [provider]  clopidogrel (PLAVIX) 75 MG tablet  07/02/17   [provider]  doxycycline (VIBRA-TABS) 100 MG tablet Take 1 tablet (100 mg total) by mouth 2 (two) times daily. 09/16/17   Edrick Kins, DPM  gabapentin (NEURONTIN) 100 MG capsule Take 200 mg by mouth. 07/15/17 07/15/18  [provider]  gentamicin cream (GARAMYCIN) 0.1 % APPLY 1 APPLICATION TOPICALLY THREE TIMES DAILY 08/15/17   Edrick Kins, DPM  insulin glargine (LANTUS) 100 unit/mL SOPN Inject 14 Units into the skin every evening.     [provider]  isosorbide mononitrate (IMDUR) 60 MG 24 hr tablet Take 60 mg by mouth daily.    [provider]  lisinopril (PRINIVIL,ZESTRIL) 10 MG tablet Take 10 mg by mouth daily. 11/04/16 11/04/17  [provider]  losartan (COZAAR) 50 MG tablet Take 50 mg by mouth. 03/06/17 03/01/18  [provider]  metoCLOPramide (REGLAN) 10 MG tablet Take 1 tablet (10 mg total) by mouth every 6 (six) hours as needed for nausea, vomiting or refractory nausea / vomiting. 10/25/16   Dustin Flock, MD  METOPROLOL TARTRATE PO Take 25 mg by mouth 2 (two) times daily.    [provider]  Multiple Vitamin (MULTIVITAMIN WITH MINERALS) TABS tablet Take 2 tablets by mouth daily. Gummy vitamins    [provider]  nitroGLYCERIN (NITROSTAT) 0.4 MG SL tablet Place 1 tablet (0.4 mg total) under the tongue every 5 (five)  minutes as needed for chest pain. 09/05/16   Loletha Grayer, MD  ondansetron (ZOFRAN) 4 MG tablet Take 4 mg by mouth every 8 (eight) hours as needed for nausea or vomiting.    [provider]  polyvinyl alcohol (LIQUIFILM TEARS) 1.4 % ophthalmic solution Place 2 drops into both eyes as needed for dry eyes.    [provider]  ranitidine (ZANTAC) 300 MG tablet Take 1 tablet (300 mg total) by mouth at bedtime. 09/27/17   Darel Hong, MD  rOPINIRole (REQUIP) 0.25 MG tablet Take 0.25 mg by mouth at bedtime.    [provider]  sevelamer carbonate (RENVELA) 800 MG tablet Take 800 mg by mouth 3 (three) times daily with meals.    [provider]  valsartan (DIOVAN) 80 MG tablet  07/16/17   [provider]    Allergies Chlorthalidone; Fentanyl; Midazolam; Ace inhibitors; Angiotensin receptor blockers; Norvasc [amlodipine]; and Phenergan [promethazine hcl]  Family History  Problem Relation Age of Onset  . Diabetes Mellitus II Mother   . Pancreatic cancer Father     Social History Social History   Tobacco Use  . Smoking status: Never Smoker  . Smokeless tobacco: Never Used  Substance Use Topics  . Alcohol use: No  . Drug use: No    Review of Systems Constitutional: No fever/chills Eyes: No visual changes. ENT: No sore throat. Cardiovascular: Positive for chest pain. Respiratory: Denies shortness of breath. Gastrointestinal: Positive for abdominal pain.  Positive for nausea, no vomiting.  No diarrhea.  No constipation. Genitourinary: Negative for dysuria. Musculoskeletal: Negative for back pain. Skin: Negative for rash. Neurological: Positive for dizziness   ____________________________________________   PHYSICAL EXAM:  VITAL SIGNS: ED Triage Vitals  Enc Vitals Group     BP 09/27/17 1138 (!) 96/47     Pulse Rate 09/27/17 1138 65     Resp 09/27/17 1138 16     Temp 09/27/17 1138 97.9 F (36.6 C)     Temp src --      SpO2  09/27/17 1138 100 %     Weight 09/27/17 1139 132 lb (59.9 kg)     Height 09/27/17 1139 5' 4.5" (1.638 m)     Head Circumference --      Peak Flow --      Pain Score 09/27/17 1139 0     Pain Loc --      Pain Edu? --      Excl. in [place]? --  Constitutional: Alert and oriented x4 joking laughing well-appearing nontoxic no diaphoresis speaks in full clear sentences Eyes: PERRL EOMI. no nystagmus Head: Atraumatic.  Normal tympanic membranes bilaterally no mastoid tenderness Nose: No congestion/rhinnorhea. Mouth/Throat: No trismus Neck: No stridor.   Cardiovascular: Normal rate, regular rhythm. Grossly normal heart sounds.  Good peripheral circulation. Respiratory: Normal respiratory effort.  No retractions. Lungs CTAB and moving good air Gastrointestinal: Soft nontender no peritonitis no McBurney's tenderness Musculoskeletal: No lower extremity edema   Neurologic:  Normal speech and language. No gross focal neurologic deficits are appreciated.  Normal finger-nose-finger no dysdiadochokinesis Skin:  Skin is warm, dry and intact. No rash noted. Psychiatric: Mood and affect are normal. Speech and behavior are normal.    ____________________________________________   DIFFERENTIAL includes but not limited to  Peripheral vertigo, central vertigo, gastritis, gastric reflux, gallstones ____________________________________________   LABS (all labs ordered are listed, but only abnormal results are displayed)  Labs Reviewed  CBC WITH DIFFERENTIAL/PLATELET - Abnormal; Notable for the following components:      Result Value   RBC 3.46 (*)    Hemoglobin 11.4 (*)    HCT 33.8 (*)    RDW 16.8 (*)    All other components within normal limits  COMPREHENSIVE METABOLIC PANEL - Abnormal; Notable for the following components:   Chloride 95 (*)    Glucose, Bld 134 (*)    BUN 41 (*)    Creatinine, Ser 4.42 (*)    ALT 12 (*)    GFR calc non Af Amer 10 (*)    GFR calc Af Amer 11 (*)    All other  components within normal limits  TROPONIN I - Abnormal; Notable for the following components:   Troponin I 0.06 (*)    All other components within normal limits  LIPASE, BLOOD    Lab work reviewed by me with elevated troponin but in the setting of renal failure is nonischemic __________________________________________  EKG  ED ECG REPORT I, Darel Hong, the attending physician, personally viewed and interpreted this ECG.  Date: 09/27/2017 EKG Time:  Rate: 68 Rhythm: normal sinus rhythm QRS Axis: Leftward axis Intervals: normal ST/T Wave abnormalities: ST elevation in aVR and V1 with T wave inversion 1 and aVL which is consistent with old EKGs Narrative Interpretation: no evidence of acute ischemia  ____________________________________________  RADIOLOGY  CT the head reviewed by me with no intracerebral pathology.  She does have sinus congestion ____________________________________________   PROCEDURES  Procedure(s) performed: no  Procedures  Critical Care performed: no  Observation: no ____________________________________________   INITIAL IMPRESSION / ASSESSMENT AND PLAN / ED COURSE  Pertinent labs & imaging results that were available during my care of the patient were reviewed by me and considered in my medical decision making (see chart for details).  The patient appears to have 2 distinct issues going on.  She has constant tinnitus on the right for the past several weeks.  She also has lightheadedness and dizziness as well as upper abdominal pain.  Her abdomen is benign.  Her lab work is unremarkable except for slightly elevated troponin in the setting of renal failure.  After a GI cocktail and Pepcid her abdominal pain and nausea are nearly completely resolved.  I did obtain a head CT given her constant dizziness for the past several weeks which is negative for intracerebral pathology although does show some sinus congestion.  I have advised the patient to  take Pepcid for the next month as well as sinus rinses  and to follow-up with ENT regarding the tinnitus.  She is discharged home in improved condition verbalizes understanding agreement plan with strict return precautions.      ____________________________________________   FINAL CLINICAL IMPRESSION(S) / ED DIAGNOSES  Final diagnoses:  Tinnitus of right ear      NEW MEDICATIONS STARTED DURING THIS VISIT:  Discharge Medication List as of 09/27/2017  3:19 PM    START taking these medications   Details  ranitidine (ZANTAC) 300 MG tablet Take 1 tablet (300 mg total) by mouth at bedtime., Starting Sat 09/27/2017, Print         Note:  This document was prepared using Dragon voice recognition software and may include unintentional dictation errors.     Darel Hong, MD 09/28/17 1037

## 2017-09-27 NOTE — Discharge Instructions (Addendum)
It was a pleasure to take care of you today, and thank you for coming to our emergency department.  If you have any questions or concerns before leaving please ask the nurse to grab me and I'm more than happy to go through your aftercare instructions again.  If you were prescribed any opioid pain medication today such as Norco, Vicodin, Percocet, morphine, hydrocodone, or oxycodone please make sure you do not drive when you are taking this medication as it can alter your ability to drive safely.  If you have any concerns once you are home that you are not improving or are in fact getting worse before you can make it to your follow-up appointment, please do not hesitate to call 911 and come back for further evaluation.  Darel Hong, MD  Results for orders placed or performed during the hospital encounter of 09/27/17  CBC with Differential  Result Value Ref Range   WBC 8.0 3.6 - 11.0 K/uL   RBC 3.46 (L) 3.80 - 5.20 MIL/uL   Hemoglobin 11.4 (L) 12.0 - 16.0 g/dL   HCT 33.8 (L) 35.0 - 47.0 %   MCV 97.5 80.0 - 100.0 fL   MCH 32.8 26.0 - 34.0 pg   MCHC 33.6 32.0 - 36.0 g/dL   RDW 16.8 (H) 11.5 - 14.5 %   Platelets 237 150 - 440 K/uL   Neutrophils Relative % 76 %   Neutro Abs 6.2 1.4 - 6.5 K/uL   Lymphocytes Relative 13 %   Lymphs Abs 1.0 1.0 - 3.6 K/uL   Monocytes Relative 8 %   Monocytes Absolute 0.6 0.2 - 0.9 K/uL   Eosinophils Relative 2 %   Eosinophils Absolute 0.1 0 - 0.7 K/uL   Basophils Relative 1 %   Basophils Absolute 0.1 0 - 0.1 K/uL  Comprehensive metabolic panel  Result Value Ref Range   Sodium 135 135 - 145 mmol/L   Potassium 4.0 3.5 - 5.1 mmol/L   Chloride 95 (L) 101 - 111 mmol/L   CO2 27 22 - 32 mmol/L   Glucose, Bld 134 (H) 65 - 99 mg/dL   BUN 41 (H) 6 - 20 mg/dL   Creatinine, Ser 4.42 (H) 0.44 - 1.00 mg/dL   Calcium 9.5 8.9 - 10.3 mg/dL   Total Protein 8.1 6.5 - 8.1 g/dL   Albumin 3.7 3.5 - 5.0 g/dL   AST 20 15 - 41 U/L   ALT 12 (L) 14 - 54 U/L   Alkaline  Phosphatase 94 38 - 126 U/L   Total Bilirubin 0.5 0.3 - 1.2 mg/dL   GFR calc non Af Amer 10 (L) >60 mL/min   GFR calc Af Amer 11 (L) >60 mL/min   Anion gap 13 5 - 15  Troponin I  Result Value Ref Range   Troponin I 0.06 (HH) <0.03 ng/mL  Lipase, blood  Result Value Ref Range   Lipase 46 11 - 51 U/L   Ct Head Wo Contrast  Result Date: 09/27/2017 CLINICAL DATA:  Nausea and ringing in the ears for 1 week. Dialysis patient. Focal neurologic deficit of greater than 6 hours. No reported injury. EXAM: CT HEAD WITHOUT CONTRAST TECHNIQUE: Contiguous axial images were obtained from the base of the skull through the vertex without intravenous contrast. COMPARISON:  None. FINDINGS: Brain: No evidence of parenchymal hemorrhage or extra-axial fluid collection. No mass lesion, mass effect, or midline shift. No CT evidence of acute infarction. Cerebral volume is age appropriate. No ventriculomegaly. Vascular: No acute abnormality.  Skull: No evidence of calvarial fracture. Sinuses/Orbits: Near complete opacification of the left sphenoid sinus. No fluid levels. Other:  The mastoid air cells are unopacified. IMPRESSION: 1.  No evidence of acute intracranial abnormality. 2. Nonspecific near complete opacification of the left sphenoid sinus, most likely mucous retention cysts or polyps. Electronically Signed   By: Ilona Sorrel M.D.   On: 09/27/2017 14:43

## 2017-09-27 NOTE — ED Triage Notes (Signed)
Per pt has been awaking with nausea x 1 week and has ringing in ears. Saw pcp and they cleaned out her rt ear but she states not better. Pt dm and on dialysis

## 2017-09-27 NOTE — ED Notes (Signed)
Pt seen by pcp and placed on meclazine. Per pt that works when she is nauseous in the mornings, but she wants to know why this is happening. No cp, no sob.

## 2017-09-30 ENCOUNTER — Ambulatory Visit (INDEPENDENT_AMBULATORY_CARE_PROVIDER_SITE_OTHER): Payer: Medicare Other | Admitting: Vascular Surgery

## 2017-09-30 ENCOUNTER — Encounter (INDEPENDENT_AMBULATORY_CARE_PROVIDER_SITE_OTHER): Payer: Self-pay | Admitting: Vascular Surgery

## 2017-09-30 VITALS — BP 116/58 | HR 72 | Resp 16 | Ht 64.5 in | Wt 128.0 lb

## 2017-09-30 DIAGNOSIS — Z992 Dependence on renal dialysis: Secondary | ICD-10-CM | POA: Diagnosis not present

## 2017-09-30 DIAGNOSIS — E785 Hyperlipidemia, unspecified: Secondary | ICD-10-CM

## 2017-09-30 DIAGNOSIS — I10 Essential (primary) hypertension: Secondary | ICD-10-CM

## 2017-09-30 DIAGNOSIS — Z794 Long term (current) use of insulin: Secondary | ICD-10-CM | POA: Diagnosis not present

## 2017-09-30 DIAGNOSIS — E118 Type 2 diabetes mellitus with unspecified complications: Secondary | ICD-10-CM

## 2017-09-30 DIAGNOSIS — I70235 Atherosclerosis of native arteries of right leg with ulceration of other part of foot: Secondary | ICD-10-CM

## 2017-09-30 DIAGNOSIS — N186 End stage renal disease: Secondary | ICD-10-CM | POA: Diagnosis not present

## 2017-09-30 NOTE — Patient Instructions (Signed)
Peritoneal Dialysis Catheter Placement Peritoneal dialysis catheter placement is a surgery to insert a thin, flexible tube (catheter) in your abdomen. This surgery must be done before you begin peritoneal dialysis. Dialysis is a treatment that replaces some of the work that healthy kidneys do. During dialysis, wastes, salt, and extra water are removed from the blood. In peritoneal dialysis, these tasks are performed by transferring a fluid called dialysate to and from your abdomen during each session. The fluid goes through the catheter to enter the abdomen at the start of each dialysis session, and it drains out of the body through the catheter at the end of each session. The catheter will be small, soft, and easy to conceal. The surgery is usually done at least 2 weeks before you begin dialysis. Tell a health care provider about:  Any allergies you have.  All medicines you are taking, including vitamins, herbs, eye drops, creams, and over-the-counter medicines.  Use of steroids (by mouth or as creams).  Any problems you or family members have had with anesthetic medicines.  Any blood disorders you have.  Any surgeries you have had.  Smoking history.  Possibility of pregnancy, if this applies.  Any medical conditions you have. What are the risks? Generally, peritoneal dialysis catheter placement is a safe procedure. However, as with any procedure, complications can occur. Possible complications include:  Excessive bleeding.  Pain.  A collection of blood near the incision (hematoma).  Infection.  Slow healing.  Catheter problems after the surgery, such as the catheter becoming blocked, the catheter moving out of place, the catheter poking into or wrapping around intestines, or fluid leaking around the catheter.  Scarring.  Skin damage.  Damage to blood vessels, tissues, or organs (such as the intestines) in the abdomen area.  What happens before the procedure?  Ask your  health care provider about changing or stopping your regular medicines. You may need to stop taking certain medicines up to 2 weeks before the procedure.  Do not eat or drink anything for at least 8 hours before the procedure.  You may be asked to wash with an antibacterial soap the night before or the morning of the procedure.  Make plans to have someone drive you home after the procedure or after your hospital stay. Ask your health care provider whether you should expect to stay in the hospital overnight or go home the same day. What happens during the procedure? The surgeon may use either an open or laparoscopic technique for this surgery.  Open surgery. ? You will be given a medicine to make you sleep through the procedure (general anesthetic). ? Once you are asleep, the surgeon will make a small cut (incision) in your abdomen. ? The catheter will be put in place. ? The incision will be closed with stitches or staples.  Laparoscopic surgery. ? You will be given a medicine to numb the site selected for the incision (local anesthetic). ? When the area is numb, the surgeon will make a small incision in your abdomen. ? A thin, lighted tube with a tiny camera on the end (laparoscope) will be put through the incision. The camera sends pictures to a video screen in the operating room. This lets the surgeon see inside the abdomen during the procedure. ? The catheter will be put in place. ? The incision will be closed with stitches or staples.  What happens after the procedure?  You will be monitored closely in a recovery area. You may feel groggy  from the anesthetic.  You may have some pain. If so, you will be given pain medicine.  You may be able to go home the same day as the procedure, or you may need to stay in the hospital overnight.  You will be given instructions on how to care for the catheter and how it is used for the dialysis process.  Your body will heal around the catheter  to hold it in place. This information is not intended to replace advice given to you by your health care provider. Make sure you discuss any questions you have with your health care provider. Document Released: 03/27/2009 Document Revised: 09/14/2015 Document Reviewed: 09/28/2012 Elsevier Interactive Patient Education  2017 [employer].

## 2017-09-30 NOTE — Assessment & Plan Note (Signed)
Patient has end-stage renal disease and has had long-term catheter dependence.  Multiple attempts at placing access in her arms were abandoned either due to steal syndrome or failure of the access.  She is adamant not to do anything further in her extremities.  She apparently has discussed with her primary care physician another dialysis option.  This is peritoneal dialysis.  She says she wants to do the dialysis that is "at home".  She has only had a hysterectomy previously, so that seems like that would be a reasonable option.  I discussed the risks and benefits of peritoneal dialysis catheter placement as well as peritoneal dialysis itself.  The patient voices her understanding and desires to proceed with peritoneal dialysis catheter placement.

## 2017-09-30 NOTE — Assessment & Plan Note (Signed)
lipid control important in reducing the progression of atherosclerotic disease. Continue statin therapy  

## 2017-09-30 NOTE — Progress Notes (Signed)
MRN : [id number]  Stephanie Ellis is a 67 y.o. ([birth year]) female who presents with chief complaint of  Chief Complaint  Patient presents with  . Follow-up  .  History of Present Illness: Patient returns today in follow up.Patient has end-stage renal disease and has had long-term catheter dependence.  Multiple attempts at placing access in her arms were abandoned either due to steal syndrome or failure of the access.  She is adamant not to do anything further in her extremities.  She apparently had issues with her catheter a couple of weeks ago that were treated successfully with what sounds like a TPA infusion.  She apparently has discussed with her primary care physician another dialysis option.  This is peritoneal dialysis.  She says she wants to do the dialysis that is "at home".   Current Outpatient Medications  Medication Sig Dispense Refill  . aspirin EC 81 MG tablet Take 1 tablet (81 mg total) by mouth daily.    Marland Kitchen atorvastatin (LIPITOR) 40 MG tablet Take 40 mg by mouth daily at 6 PM.     . atorvastatin (LIPITOR) 80 MG tablet     . ATROPINE SULFATE-NACL OP Apply 1 drop to eye 2 (two) times daily. Left eye only    . carvedilol (COREG) 3.125 MG tablet Take 3.125 mg by mouth.    . cholecalciferol (VITAMIN D) 400 units TABS tablet Take by mouth.    . doxycycline (VIBRA-TABS) 100 MG tablet Take 1 tablet (100 mg total) by mouth 2 (two) times daily. 20 tablet 0  . gentamicin cream (GARAMYCIN) 0.1 % APPLY 1 APPLICATION TOPICALLY THREE TIMES DAILY 30 g 1  . insulin glargine (LANTUS) 100 unit/mL SOPN Inject 7 Units into the skin every evening.     . isosorbide mononitrate (IMDUR) 60 MG 24 hr tablet Take 60 mg by mouth daily.    Marland Kitchen lisinopril (PRINIVIL,ZESTRIL) 10 MG tablet Take 10 mg by mouth daily.    Marland Kitchen losartan (COZAAR) 50 MG tablet Take 50 mg by mouth.    . metoCLOPramide (REGLAN) 10 MG tablet Take 1 tablet (10 mg total) by mouth every 6 (six) hours as needed for nausea, vomiting or  refractory nausea / vomiting. 30 tablet 0  . METOPROLOL TARTRATE PO Take 25 mg by mouth 2 (two) times daily.    . Multiple Vitamin (MULTIVITAMIN WITH MINERALS) TABS tablet Take 2 tablets by mouth daily. Gummy vitamins    . nitroGLYCERIN (NITROSTAT) 0.4 MG SL tablet Place 1 tablet (0.4 mg total) under the tongue every 5 (five) minutes as needed for chest pain. 30 tablet 0  . ondansetron (ZOFRAN) 4 MG tablet Take 4 mg by mouth every 8 (eight) hours as needed for nausea or vomiting.    . polyvinyl alcohol (LIQUIFILM TEARS) 1.4 % ophthalmic solution Place 2 drops into both eyes as needed for dry eyes.    . ranitidine (ZANTAC) 300 MG tablet Take 1 tablet (300 mg total) by mouth at bedtime. 60 tablet 0  . sevelamer carbonate (RENVELA) 800 MG tablet Take 800 mg by mouth 3 (three) times daily with meals.    . valsartan (DIOVAN) 80 MG tablet   3  . clopidogrel (PLAVIX) 75 MG tablet     . gabapentin (NEURONTIN) 100 MG capsule Take 200 mg by mouth.    Marland Kitchen rOPINIRole (REQUIP) 0.25 MG tablet Take 0.25 mg by mouth at bedtime.     No current facility-administered medications for this visit.     Past Medical  History:  Diagnosis Date  . Anemia   . Anorexia   . Bacteremia due to Pseudomonas   . CHF (congestive heart failure) (HCC)    has not happened recently  . Complication of anesthesia 01/2016   WITH LAST STENT 10/17. PASsED OUT AND HAD TO BE AWAKENED  . Coronary artery disease involving left main coronary artery 01/2015   [facility]: 70% LM, p-mLAD 50-60% (Resting FFR 0.75), mRCA 80-90%, ~40 Ost OM & D1  . Dysrhythmia   . ESRD (end stage renal disease) on dialysis [facility])    ESRD secondary to acute kidney failure s/p CABG  DIALYSIS M/W/F  . Essential hypertension   . GERD (gastroesophageal reflux disease)   . Heart murmur    no treatment  . Hypercholesterolemia   . Myocardial infarction ([place]) 2016  . 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 Marland Kitchen Haithcock: CABG x 3, LIMA to LAD, SVG to RCA, SVG to OM3,  EVH  . Type II diabetes mellitus with complication (HCC)    CAD    Past Surgical History:  Procedure Laterality Date  . ABDOMINAL HYSTERECTOMY    . ARTERIOVENOUS GRAFT PLACEMENT  05/2016  . AV FISTULA PLACEMENT Left 05/30/2016   Procedure: ARTERIOVENOUS graft;  Surgeon: Algernon Huxley, MD;  Location: [facility] ORS;  Service: Vascular;  Laterality: Left;  . CARDIAC CATHETERIZATION  01/2015   [facility]: Ost LM 70%, p-m LAD 50-60% (Rest FFR + @ 0.75), mRCA 80-90%, ostD1 40%, pOM1 40%  . CATARACT EXTRACTION W/PHACO Left 01/18/2016   Procedure: CATARACT EXTRACTION PHACO AND INTRAOCULAR LENS PLACEMENT (IOC);  Surgeon: Eulogio Bear, MD;  Location: [facility] ORS;  Service: Ophthalmology;  Laterality: Left;  [place] 1.05 AP% 15.5 CDE 10.16 Fluid Pack Lot # [id number]  . CATARACT EXTRACTION W/PHACO Right 08/01/2016   Procedure: CATARACT EXTRACTION PHACO AND INTRAOCULAR LENS PLACEMENT (IOC);  Surgeon: Eulogio Bear, MD;  Location: [facility] ORS;  Service: Ophthalmology;  Laterality: Right;  [place] 01:00.6 AP% 11.4 CDE 6.93  LOT # [id number]  . COLONOSCOPY    . CORONARY ANGIOPLASTY     SENTS 02/12/16  . CORONARY ARTERY BYPASS GRAFT  03/28/15    [facility]: Dr. Waldemar Dickens: LIMA to LAD, SVG to RCA, SVG to OM3, EVH  . ESOPHAGOGASTRODUODENOSCOPY (EGD) WITH PROPOFOL N/A 10/24/2016   Procedure: ESOPHAGOGASTRODUODENOSCOPY (EGD) WITH PROPOFOL;  Surgeon: [name], MD;  Location: [facility] ENDOSCOPY;  Service: Endoscopy;  Laterality: N/A;  . EYE SURGERY Bilateral    cataract surgery  . INSERTION EXPRESS TUBE SHUNT Right 08/01/2016   Procedure: INSERTION EXPRESS TUBE SHUNT;  Surgeon: Eulogio Bear, MD;  Location: [facility] ORS;  Service: Ophthalmology;  Laterality: Right;  . INSERTION OF AHMED VALVE Left 08/15/2016   Procedure: INSERTION OF AHMED VALVE;  Surgeon: Eulogio Bear, MD;  Location: [facility] ORS;  Service: Ophthalmology;  Laterality: Left;  . INSERTION OF DIALYSIS CATHETER    . TRANSTHORACIC ECHOCARDIOGRAM  January 2017    EF 60-65%.  GR 2 DD. Mild degenerative mitral valve disease but no prolapse or regurgitation. Mild left atrial dilation. Mild to moderate LVH. Pericardial effusion gone  . UPPER EXTREMITY ANGIOGRAPHY Left 06/27/2016   Procedure: Upper Extremity Angiography;  Surgeon: Algernon Huxley, MD;  Location: [place] CV LAB;  Service: Cardiovascular;  Laterality: Left;  . UPPER EXTREMITY ANGIOGRAPHY Left 08/22/2016   Procedure: Upper Extremity Angiography;  Surgeon: Algernon Huxley, MD;  Location: [place] CV LAB;  Service: Cardiovascular;  Laterality: Left;    Social  History       Tobacco Use  . Smoking status: Never Smoker  . Smokeless tobacco: Never Used  Substance Use Topics  . Alcohol use: No  . Drug use: No     Family History      Family History  Problem Relation Age of Onset  . Diabetes Mellitus II Mother   . Pancreatic cancer Father          Allergies  Allergen Reactions  . Chlorthalidone Anaphylaxis, Itching and Rash  . Fentanyl Rash  . Midazolam Rash  . Ace Inhibitors Other (See Comments)    Reaction:  Hyperkalemia, agitation   . Angiotensin Receptor Blockers Other (See Comments)    Reaction:  Hyperkalemia, agitation   . Norvasc [Amlodipine] Itching and Rash  . Phenergan [Promethazine Hcl] Anxiety    "antsy, can't sit still"     REVIEW OF SYSTEMS (Negative unless checked)  Constitutional: [] Weight loss  [] Fever  [] Chills Cardiac: [] Chest pain   [] Chest pressure   [] Palpitations   [] Shortness of breath when laying flat   [] Shortness of breath at rest   [] Shortness of breath with exertion. Vascular:  [] Pain in legs with walking   [] Pain in legs at rest   [] Pain in legs when laying flat   [] Claudication   [] Pain in feet when walking  [] Pain in feet at rest  [] Pain in feet when laying flat   [] History of DVT   [] Phlebitis   [] Swelling in legs   [] Varicose veins   [] Non-healing ulcers Pulmonary:   [] Uses home oxygen   [] Productive cough   [] Hemoptysis   [] Wheeze  [] COPD    [] Asthma Neurologic:  [] Dizziness  [] Blackouts   [] Seizures   [] History of stroke   [] History of TIA  [] Aphasia   [] Temporary blindness   [] Dysphagia   [x] Weakness or numbness in arms   [] Weakness or numbness in legs Musculoskeletal:  [x] Arthritis   [] Joint swelling   [] Joint pain   [] Low back pain Hematologic:  [x] Easy bruising  [] Easy bleeding   [] Hypercoagulable state   [x] Anemic   Gastrointestinal:  [] Blood in stool   [] Vomiting blood  [] Gastroesophageal reflux/heartburn   [] Abdominal pain Genitourinary:  [x] Chronic kidney disease   [] Difficult urination  [] Frequent urination  [] Burning with urination   [] Hematuria Skin:  [] Rashes   [] Ulcers   [] Wounds Psychological:  [] History of anxiety   []  History of major depression.      Physical Examination  BP (!) 116/58 (BP Location: Right Arm)   Pulse 72   Resp 16   Ht 5' 4.5" (1.638 m)   Wt 128 lb (58.1 kg)   BMI 21.63 kg/m  Gen:  WD/WN, NAD Head: [place]/AT, No temporalis wasting. Ear/Nose/Throat: Hearing grossly intact, nares w/o erythema or drainage Eyes: Conjunctiva clear. Sclera non-icteric Neck: Supple.  Trachea midline Pulmonary:  Good air movement, no use of accessory muscles.  Cardiac: RRR, no JVD Vascular: failed access in left arm Vessel Right Left  Radial Palpable Palpable                                   Gastrointestinal: soft, non-tender/non-distended. No guarding/reflex.  Musculoskeletal: M/S 5/5 throughout.  No deformity or atrophy. Trace LE edema Neurologic: Sensation grossly intact in extremities.  Symmetrical.  Speech is fluent.  Psychiatric: Judgment intact, Mood & affect appropriate for pt's clinical situation. Dermatologic: No rashes or ulcers noted.  No cellulitis or open wounds.  Labs Recent Results (from the past 2160 hour(s))  WOUND CULTURE     Status: None   Collection Time: 09/16/17  3:05 PM  Result Value Ref Range   Gram Stain Result Final report    Organism ID, Bacteria Comment      Comment: No white blood cells seen.   Organism ID, Bacteria Comment     Comment: Moderate gram negative rods.   Aerobic Bacterial Culture Final report    Organism ID, Bacteria Comment     Comment: Multiple negative rods present, none predominant. This is indicative of a heavily colonized/contaminated specimen site. No further microbiological characterization will be done as it will not provide clinically relevant information. Heavy growth   CBC with Differential     Status: Abnormal   Collection Time: 09/27/17 12:14 PM  Result Value Ref Range   WBC 8.0 3.6 - 11.0 K/uL   RBC 3.46 (L) 3.80 - 5.20 MIL/uL   Hemoglobin 11.4 (L) 12.0 - 16.0 g/dL   HCT 33.8 (L) 35.0 - 47.0 %   MCV 97.5 80.0 - 100.0 fL   MCH 32.8 26.0 - 34.0 pg   MCHC 33.6 32.0 - 36.0 g/dL   RDW 16.8 (H) 11.5 - 14.5 %   Platelets 237 150 - 440 K/uL   Neutrophils Relative % 76 %   Neutro Abs 6.2 1.4 - 6.5 K/uL   Lymphocytes Relative 13 %   Lymphs Abs 1.0 1.0 - 3.6 K/uL   Monocytes Relative 8 %   Monocytes Absolute 0.6 0.2 - 0.9 K/uL   Eosinophils Relative 2 %   Eosinophils Absolute 0.1 0 - 0.7 K/uL   Basophils Relative 1 %   Basophils Absolute 0.1 0 - 0.1 K/uL    Comment: Performed at [facility], [address] [place], [place] [address]  Comprehensive metabolic panel     Status: Abnormal   Collection Time: 09/27/17 12:14 PM  Result Value Ref Range   Sodium 135 135 - 145 mmol/L   Potassium 4.0 3.5 - 5.1 mmol/L   Chloride 95 (L) 101 - 111 mmol/L   CO2 27 22 - 32 mmol/L   Glucose, Bld 134 (H) 65 - 99 mg/dL   BUN 41 (H) 6 - 20 mg/dL   Creatinine, Ser 4.42 (H) 0.44 - 1.00 mg/dL   Calcium 9.5 8.9 - 10.3 mg/dL   Total Protein 8.1 6.5 - 8.1 g/dL   Albumin 3.7 3.5 - 5.0 g/dL   AST 20 15 - 41 U/L   ALT 12 (L) 14 - 54 U/L   Alkaline Phosphatase 94 38 - 126 U/L   Total Bilirubin 0.5 0.3 - 1.2 mg/dL   GFR calc non Af Amer 10 (L) >60 mL/min   GFR calc Af Amer 11 (L) >60 mL/min    Comment: (NOTE) The  eGFR has been calculated using the CKD EPI equation. This calculation has not been validated in all clinical situations. eGFR's persistently <60 mL/min signify possible Chronic Kidney Disease.    Anion gap 13 5 - 15    Comment: Performed at [facility], [address] [place], [place] [address]  Troponin I     Status: Abnormal   Collection Time: 09/27/17 12:14 PM  Result Value Ref Range   Troponin I 0.06 (HH) <0.03 ng/mL    Comment: CRITICAL RESULT CALLED TO, READ BACK BY AND VERIFIED WITH BRANDY DAVIS 09/27/17 @ 1323  [place] Performed at [facility], [address] [place], [place] [address]  Lipase, blood     Status: None   Collection Time: 09/27/17 12:14 PM  Result Value Ref Range   Lipase 46 11 - 51 U/L    Comment: Performed at [facility], [address], [place], [place] [address]    Radiology Ct Head Wo Contrast  Result Date: 09/27/2017 CLINICAL DATA:  Nausea and ringing in the ears for 1 week. Dialysis patient. Focal neurologic deficit of greater than 6 hours. No reported injury. EXAM: CT HEAD WITHOUT CONTRAST TECHNIQUE: Contiguous axial images were obtained from the base of the skull through the vertex without intravenous contrast. COMPARISON:  None. FINDINGS: Brain: No evidence of parenchymal hemorrhage or extra-axial fluid collection. No mass lesion, mass effect, or midline shift. No CT evidence of acute infarction. Cerebral volume is age appropriate. No ventriculomegaly. Vascular: No acute abnormality. Skull: No evidence of calvarial fracture. Sinuses/Orbits: Near complete opacification of the left sphenoid sinus. No fluid levels. Other:  The mastoid air cells are unopacified. IMPRESSION: 1.  No evidence of acute intracranial abnormality. 2. Nonspecific near complete opacification of the left sphenoid sinus, most likely mucous retention cysts or polyps. Electronically Signed   By: Ilona Sorrel M.D.   On: 09/27/2017 14:43     Assessment/Plan Hypertension blood pressure control important in reducing the progression of atherosclerotic disease. On appropriate oral medications.   Type II diabetes mellitus with complication (HCC) blood glucose control important in reducing the progression of atherosclerotic disease. Also, involved in wound healing. On appropriate medications.   Hyperlipidemia lipid control important in reducing the progression of atherosclerotic disease. Continue statin therapy   ESRD (end stage renal disease) on dialysis [facility]) Patient has end-stage renal disease and has had long-term catheter dependence.  Multiple attempts at placing access in her arms were abandoned either due to steal syndrome or failure of the access.  She is adamant not to do anything further in her extremities.  She apparently has discussed with her primary care physician another dialysis option.  This is peritoneal dialysis.  She says she wants to do the dialysis that is "at home".  She has only had a hysterectomy previously, so that seems like that would be a reasonable option.  I discussed the risks and benefits of peritoneal dialysis catheter placement as well as peritoneal dialysis itself.  The patient voices her understanding and desires to proceed with peritoneal dialysis catheter placement.    Leotis Pain, MD  09/30/2017 5:12 PM    This note was created with Dragon medical transcription system.  Any errors from dictation are purely unintentional

## 2017-10-03 ENCOUNTER — Encounter (INDEPENDENT_AMBULATORY_CARE_PROVIDER_SITE_OTHER): Payer: Self-pay

## 2017-10-06 ENCOUNTER — Other Ambulatory Visit (INDEPENDENT_AMBULATORY_CARE_PROVIDER_SITE_OTHER): Payer: Self-pay | Admitting: Vascular Surgery

## 2017-10-07 ENCOUNTER — Encounter: Payer: Self-pay | Admitting: Podiatry

## 2017-10-07 ENCOUNTER — Ambulatory Visit (INDEPENDENT_AMBULATORY_CARE_PROVIDER_SITE_OTHER): Payer: Medicare Other | Admitting: Podiatry

## 2017-10-07 ENCOUNTER — Ambulatory Visit (INDEPENDENT_AMBULATORY_CARE_PROVIDER_SITE_OTHER): Payer: Medicare Other

## 2017-10-07 DIAGNOSIS — L97522 Non-pressure chronic ulcer of other part of left foot with fat layer exposed: Secondary | ICD-10-CM

## 2017-10-07 DIAGNOSIS — E0843 Diabetes mellitus due to underlying condition with diabetic autonomic (poly)neuropathy: Secondary | ICD-10-CM

## 2017-10-07 DIAGNOSIS — I70235 Atherosclerosis of native arteries of right leg with ulceration of other part of foot: Secondary | ICD-10-CM

## 2017-10-09 ENCOUNTER — Encounter
Admission: RE | Admit: 2017-10-09 | Discharge: 2017-10-09 | Disposition: A | Discharge status: Home / Self Care | Payer: Medicare Other | Source: Ambulatory Visit | Attending: Vascular Surgery | Admitting: Vascular Surgery

## 2017-10-09 ENCOUNTER — Other Ambulatory Visit: Payer: Self-pay

## 2017-10-09 DIAGNOSIS — Z01812 Encounter for preprocedural laboratory examination: Secondary | ICD-10-CM | POA: Diagnosis present

## 2017-10-09 DIAGNOSIS — Z01810 Encounter for preprocedural cardiovascular examination: Secondary | ICD-10-CM | POA: Insufficient documentation

## 2017-10-09 HISTORY — DX: Chronic sinusitis, unspecified: J32.9

## 2017-10-09 LAB — BASIC METABOLIC PANEL
Anion gap: 11 (ref 5–15)
BUN: 37 mg/dL — ABNORMAL HIGH (ref 6–20)
CO2: 27 mmol/L (ref 22–32)
Calcium: 9.3 mg/dL (ref 8.9–10.3)
Chloride: 97 mmol/L — ABNORMAL LOW (ref 101–111)
Creatinine, Ser: 4.29 mg/dL — ABNORMAL HIGH (ref 0.44–1.00)
GFR calc Af Amer: 11 mL/min — ABNORMAL LOW (ref >60)
GFR calc non Af Amer: 10 mL/min — ABNORMAL LOW (ref >60)
Glucose, Bld: 240 mg/dL — ABNORMAL HIGH (ref 65–99)
Potassium: 3.7 mmol/L (ref 3.5–5.1)
Sodium: 135 mmol/L (ref 135–145)

## 2017-10-09 LAB — CBC WITH DIFFERENTIAL/PLATELET
Basophils Absolute: 0 10*3/uL (ref 0–0.1)
Basophils Relative: 0 %
Eosinophils Absolute: 0 10*3/uL (ref 0–0.7)
Eosinophils Relative: 0 %
HCT: 33.1 % — ABNORMAL LOW (ref 35.0–47.0)
Hemoglobin: 11 g/dL — ABNORMAL LOW (ref 12.0–16.0)
Lymphocytes Relative: 7 %
Lymphs Abs: 0.6 10*3/uL — ABNORMAL LOW (ref 1.0–3.6)
MCH: 32.4 pg (ref 26.0–34.0)
MCHC: 33.2 g/dL (ref 32.0–36.0)
MCV: 97.5 fL (ref 80.0–100.0)
Monocytes Absolute: 0.4 10*3/uL (ref 0.2–0.9)
Monocytes Relative: 4 %
Neutro Abs: 7.1 10*3/uL — ABNORMAL HIGH (ref 1.4–6.5)
Neutrophils Relative %: 89 %
Platelets: 161 10*3/uL (ref 150–440)
RBC: 3.4 MIL/uL — ABNORMAL LOW (ref 3.80–5.20)
RDW: 16 % — ABNORMAL HIGH (ref 11.5–14.5)
WBC: 8.1 10*3/uL (ref 3.6–11.0)

## 2017-10-09 LAB — PROTIME-INR
INR: 1.04
Prothrombin Time: 13.5 seconds (ref 11.4–15.2)

## 2017-10-09 LAB — TYPE AND SCREEN
ABO/RH(D): B POS
Antibody Screen: NEGATIVE

## 2017-10-09 LAB — APTT: aPTT: 30 seconds (ref 24–36)

## 2017-10-09 NOTE — Patient Instructions (Signed)
Your procedure is scheduled on: Thurs. 6/27 Report to Day Surgery. To find out your arrival time please call [phone] between 1PM - 3PM on Wed. 6/26.  Remember: Instructions that are not followed completely may result in serious medical risk, up to and including death, or upon the discretion of your surgeon and anesthesiologist your surgery may need to be rescheduled.     _X__ 1. Do not eat food after midnight the night before your procedure.                 No gum chewing or hard candies. You may drink clear liquids up to 2 hours                 before you are scheduled to arrive for your surgery- DO not drink clear                 liquids within 2 hours of the start of your surgery.                 Clear Liquids include:  water, apple juice without pulp, clear carbohydrate                 drink such as Clearfast of Gartorade, Black Coffee or Tea (Do not add                 anything to coffee or tea).  __X__2.  On the morning of surgery brush your teeth with toothpaste and water, you  may rinse your mouth with mouthwash if you wish.  Do not swallow any              toothpaste of mouthwash.     _X__ 3.  No Alcohol for 24 hours before or after surgery.   ___ 4.  Do Not Smoke or use e-cigarettes For 24 Hours Prior to Your Surgery.                 Do not use any chewable tobacco products for at least 6 hours prior to                 surgery.  ____  5.  Bring all medications with you on the day of surgery if instructed.   _x___  6.  Notify your doctor if there is any change in your medical condition      (cold, fever, infections).     Do not wear jewelry, make-up, hairpins, clips or nail polish. Do not wear lotions, powders, or perfumes. You may wear deodorant. Do not shave 48 hours prior to surgery. Men may shave face and neck. Do not bring valuables to the hospital.    [facility] is not responsible for any belongings or valuables.  Contacts, dentures or  bridgework may not be worn into surgery. Leave your suitcase in the car. After surgery it may be brought to your room. For patients admitted to the hospital, discharge time is determined by your treatment team.   Patients discharged the day of surgery will not be allowed to drive home.   Please read over the following fact sheets that you were given:    __x__ Take these medicines the morning of surgery with A SIP OF WATER:    1. metoprolol tartrate (LOPRESSOR) 25 MG tablet if currently taking  2. carvedilol (COREG) 3.125 MG tablet  3. isosorbide mononitrate (IMDUR) 60 MG 24 hr tablet  4.ranitidine (ZANTAC) 300 MG tablet extra dose the night before and  one morning of surgery  5.  6.  ____ Fleet Enema (as directed)   __x__ Use Sage wipes as directed  ____ Use inhalers on the day of surgery  ____ Stop metformin 2 days prior to surgery    __x_ Take 1/2 of usual insulin dose the night before surgery. No insulin the morning          of surgery.   __x__ Stop aspirin on last dose 6/23  ____ Stop Anti-inflammatories on    ____ Stop supplements until after surgery.    ____ Bring C-Pap to the hospital.

## 2017-10-10 NOTE — Progress Notes (Signed)
Subjective:  67 year old female presenting today for follow up evaluation of an ulceration of bilateral great toes. She states the wound on the right hallux has improved significantly. The left great toe is unchanged. She reports some intermittent pain of this area. Patient is here for further evaluation and treatment.   Past Medical History:  Diagnosis Date  . Anemia   . Anorexia   . Bacteremia due to Pseudomonas   . CHF (congestive heart failure) (HCC)    has not happened recently  . Complication of anesthesia 01/2016   WITH LAST STENT 10/17. PASsED OUT AND HAD TO BE AWAKENED  . Coronary artery disease involving left main coronary artery 01/2015   [facility]: 70% LM, p-mLAD 50-60% (Resting FFR 0.75), mRCA 80-90%, ~40 Ost OM & D1  . Dysrhythmia   . ESRD (end stage renal disease) on dialysis [facility])    ESRD secondary to acute kidney failure s/p CABG  DIALYSIS M/W/F  . Essential hypertension   . GERD (gastroesophageal reflux disease)   . Heart murmur    no treatment  . Hypercholesterolemia   . Myocardial infarction ([place]) 2016  . 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 Marland Kitchen Haithcock: CABG x 3, LIMA to LAD, SVG to RCA, SVG to OM3, EVH  . Sinusitis 2019  . Type II diabetes mellitus with complication (HCC)    CAD      Objective/Physical Exam General: The patient is alert and oriented x3 in no acute distress.  Dermatology:  Wound #1 noted to the right great toe has healed. Complete re-epithelialization has occurred. No drainage noted.   Wound #2 noted to the left great toe measuring 2.0 x 1.5 x 0.3 cm.  To the noted ulceration(s), there is no eschar. There is a moderate amount of slough, fibrin, and necrotic tissue noted. Granulation tissue and wound base is red. There is a minimal amount of serosanguineous drainage noted. There is no exposed bone muscle-tendon ligament or joint. There is malodor noted. Periwound integrity is intact. Skin is warm, dry and supple bilateral lower  extremities.  Vascular: DP pulse faintly palpable of the left lower extremity. Left PT pulse absent. No edema or erythema noted. Capillary refill within normal limits.  Neurological: Epicritic and protective threshold absent bilaterally.   Musculoskeletal Exam: Range of motion within normal limits to all pedal and ankle joints bilateral. Muscle strength 5/5 in all groups bilateral.   Radiographic Exam:  Normal osseous mineralization. Joint spaces preserved. No fracture/dislocation/boney destruction.     Assessment: #1 ulceration of the right hallux - healed #2 ulceration of the left hallux secondary to DM   Plan of Care:  #1 Patient was evaluated. #2 medically necessary excisional debridement including muscle and deep fascial tissue was performed using a tissue nipper and a chisel blade. Excisional debridement of all the necrotic nonviable tissue down to healthy bleeding viable tissue was performed with post-debridement measurements same as pre-. #3 The wounds were cleansed and dry sterile dressing applied. #4 Recommended Hydrogel wound dressing daily.  #5 Continue wearing post op shoe.  #6 Continue management with Dr. Lucky Cowboy, vascular.  #7 Return to clinic in 3 weeks.     Edrick Kins, DPM [facility]  Dr. Edrick Kins, DPM    [address]  [place], [place] [address]                Office [phone]  Fax [phone]

## 2017-10-15 ENCOUNTER — Emergency Department
Admission: EM | Admit: 2017-10-15 | Discharge: 2017-10-15 | Disposition: A | Discharge status: Home / Self Care | Payer: Medicare Other | Attending: Student in an Organized Health Care Education/Training Program | Admitting: Student in an Organized Health Care Education/Training Program

## 2017-10-15 ENCOUNTER — Emergency Department: Payer: Medicare Other

## 2017-10-15 ENCOUNTER — Other Ambulatory Visit: Payer: Self-pay

## 2017-10-15 ENCOUNTER — Encounter: Payer: Self-pay | Admitting: Emergency Medicine

## 2017-10-15 DIAGNOSIS — R51 Headache: Secondary | ICD-10-CM | POA: Insufficient documentation

## 2017-10-15 DIAGNOSIS — E119 Type 2 diabetes mellitus without complications: Secondary | ICD-10-CM | POA: Diagnosis not present

## 2017-10-15 DIAGNOSIS — I132 Hypertensive heart and chronic kidney disease with heart failure and with stage 5 chronic kidney disease, or end stage renal disease: Secondary | ICD-10-CM | POA: Diagnosis not present

## 2017-10-15 DIAGNOSIS — Z992 Dependence on renal dialysis: Secondary | ICD-10-CM | POA: Insufficient documentation

## 2017-10-15 DIAGNOSIS — Z7982 Long term (current) use of aspirin: Secondary | ICD-10-CM | POA: Diagnosis not present

## 2017-10-15 DIAGNOSIS — I5032 Chronic diastolic (congestive) heart failure: Secondary | ICD-10-CM | POA: Diagnosis not present

## 2017-10-15 DIAGNOSIS — N186 End stage renal disease: Secondary | ICD-10-CM | POA: Insufficient documentation

## 2017-10-15 DIAGNOSIS — R519 Headache, unspecified: Secondary | ICD-10-CM

## 2017-10-15 DIAGNOSIS — Z794 Long term (current) use of insulin: Secondary | ICD-10-CM | POA: Diagnosis not present

## 2017-10-15 LAB — BASIC METABOLIC PANEL
Anion gap: 10 (ref 5–15)
BUN: 40 mg/dL — ABNORMAL HIGH (ref 8–23)
CO2: 28 mmol/L (ref 22–32)
Calcium: 7.8 mg/dL — ABNORMAL LOW (ref 8.9–10.3)
Chloride: 97 mmol/L — ABNORMAL LOW (ref 98–111)
Creatinine, Ser: 3.7 mg/dL — ABNORMAL HIGH (ref 0.44–1.00)
GFR calc Af Amer: 14 mL/min — ABNORMAL LOW (ref >60)
GFR calc non Af Amer: 12 mL/min — ABNORMAL LOW (ref >60)
Glucose, Bld: 176 mg/dL — ABNORMAL HIGH (ref 70–99)
Potassium: 3.3 mmol/L — ABNORMAL LOW (ref 3.5–5.1)
Sodium: 135 mmol/L (ref 135–145)

## 2017-10-15 LAB — CBC WITH DIFFERENTIAL/PLATELET
Basophils Absolute: 0 10*3/uL (ref 0–0.1)
Basophils Relative: 0 %
Eosinophils Absolute: 0.3 10*3/uL (ref 0–0.7)
Eosinophils Relative: 3 %
HCT: 32.3 % — ABNORMAL LOW (ref 35.0–47.0)
Hemoglobin: 10.8 g/dL — ABNORMAL LOW (ref 12.0–16.0)
Lymphocytes Relative: 14 %
Lymphs Abs: 1.3 10*3/uL (ref 1.0–3.6)
MCH: 32.3 pg (ref 26.0–34.0)
MCHC: 33.6 g/dL (ref 32.0–36.0)
MCV: 96.2 fL (ref 80.0–100.0)
Monocytes Absolute: 0.8 10*3/uL (ref 0.2–0.9)
Monocytes Relative: 8 %
Neutro Abs: 6.9 10*3/uL — ABNORMAL HIGH (ref 1.4–6.5)
Neutrophils Relative %: 75 %
Platelets: 171 10*3/uL (ref 150–440)
RBC: 3.36 MIL/uL — ABNORMAL LOW (ref 3.80–5.20)
RDW: 15.6 % — ABNORMAL HIGH (ref 11.5–14.5)
WBC: 9.2 10*3/uL (ref 3.6–11.0)

## 2017-10-15 MED ORDER — ACETAMINOPHEN 500 MG PO TABS
1000.0000 mg | ORAL_TABLET | Freq: Once | ORAL | Status: AC
  Administered 2017-10-15: 1000 mg via ORAL
  Filled 2017-10-15: qty 2

## 2017-10-15 MED ORDER — DIPHENHYDRAMINE HCL 50 MG/ML IJ SOLN
12.5000 mg | Freq: Once | INTRAMUSCULAR | Status: AC
  Administered 2017-10-15: 12.5 mg via INTRAVENOUS
  Filled 2017-10-15: qty 1

## 2017-10-15 MED ORDER — PROCHLORPERAZINE EDISYLATE 10 MG/2ML IJ SOLN
10.0000 mg | Freq: Once | INTRAMUSCULAR | Status: AC
  Administered 2017-10-15: 10 mg via INTRAVENOUS
  Filled 2017-10-15: qty 2

## 2017-10-15 NOTE — ED Triage Notes (Signed)
Pt presents to ED via [facility] with c/o dizziness, nausea, blurred vision, and SOB. Pt presents to ED alert and oriented. Per EMS pt came in with symptoms today, they started treatment, received approx 1hr of 45 mins of treatment prior to dialysis center calling EMS. Per EMS, negative stroke screen, and patient also c/o HA all day. Pt denies unilateral weakness at this time.

## 2017-10-15 NOTE — ED Notes (Signed)
Pt requesting IV to be placed to L side, pt with clotted fistula to L side. Per MD okay to use L side for IV.

## 2017-10-15 NOTE — ED Provider Notes (Signed)
[facility] Emergency Department Provider Note    First MD Initiated Contact with Patient 10/15/17 1325     (approximate)  I have reviewed the triage vital signs and the nursing notes.   HISTORY  Chief Complaint Shortness of Breath; Dizziness; and Nausea    HPI Stephanie Ellis is a 67 y.o. female extensive past medical history presents to the ER midway through dialysis with chief complaint of nausea blurry vision and headache.  Patient states she was also feeling dizzy with some shortness of breath.  Has had symptoms like this before but never had a headache this bad.  States it was not sudden in onset.  Denies any trauma.  No focal numbness or tingling.  Denies any chest pain.  No recent fevers.  States the pain is roughly 6 out of 10 in severity and frontal area.  States it feels consistent with previous episodes of sinusitis.    Past Medical History:  Diagnosis Date  . Anemia   . Anorexia   . Bacteremia due to Pseudomonas   . CHF (congestive heart failure) (HCC)    has not happened recently  . Complication of anesthesia 01/2016   WITH LAST STENT 10/17. PASsED OUT AND HAD TO BE AWAKENED  . Coronary artery disease involving left main coronary artery 01/2015   [facility]: 70% LM, p-mLAD 50-60% (Resting FFR 0.75), mRCA 80-90%, ~40 Ost OM & D1  . Dysrhythmia   . ESRD (end stage renal disease) on dialysis [facility])    ESRD secondary to acute kidney failure s/p CABG  DIALYSIS M/W/F  . Essential hypertension   . GERD (gastroesophageal reflux disease)   . Heart murmur    no treatment  . Hypercholesterolemia   . Myocardial infarction ([place]) 2016  . 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 Marland Kitchen [facility]cock: CABG x 3, LIMA to LAD, SVG to RCA, SVG to OM3, EVH  . Sinusitis 2019  . Type II diabetes mellitus with complication (HCC)    CAD   Family History  Problem Relation Age of Onset  . Diabetes Mellitus II Mother   . Pancreatic cancer Father    Past Surgical History:   Procedure Laterality Date  . ABDOMINAL HYSTERECTOMY    . ARTERIOVENOUS GRAFT PLACEMENT  05/2016  . AV FISTULA PLACEMENT Left 05/30/2016   Procedure: ARTERIOVENOUS graft;  Surgeon: Algernon Huxley, MD;  Location: [facility] ORS;  Service: Vascular;  Laterality: Left;  . CARDIAC CATHETERIZATION  01/2015   [facility]: Ost LM 70%, p-m LAD 50-60% (Rest FFR + @ 0.75), mRCA 80-90%, ostD1 40%, pOM1 40%  . CATARACT EXTRACTION W/PHACO Left 01/18/2016   Procedure: CATARACT EXTRACTION PHACO AND INTRAOCULAR LENS PLACEMENT (IOC);  Surgeon: Eulogio Bear, MD;  Location: [facility] ORS;  Service: Ophthalmology;  Laterality: Left;  [place] 1.05 AP% 15.5 CDE 10.16 Fluid Pack Lot # [id number]  . CATARACT EXTRACTION W/PHACO Right 08/01/2016   Procedure: CATARACT EXTRACTION PHACO AND INTRAOCULAR LENS PLACEMENT (IOC);  Surgeon: Eulogio Bear, MD;  Location: [facility] ORS;  Service: Ophthalmology;  Laterality: Right;  [place] 01:00.6 AP% 11.4 CDE 6.93  LOT # [id number]  . COLONOSCOPY    . CORONARY ANGIOPLASTY     SENTS 02/12/16  . CORONARY ARTERY BYPASS GRAFT  03/28/15    [facility]: Dr. Waldemar Dickens: LIMA to LAD, SVG to RCA, SVG to OM3, EVH  . ESOPHAGOGASTRODUODENOSCOPY (EGD) WITH PROPOFOL N/A 10/24/2016   Procedure: ESOPHAGOGASTRODUODENOSCOPY (EGD) WITH PROPOFOL;  Surgeon: [name], MD;  Location: [facility]  ENDOSCOPY;  Service: Endoscopy;  Laterality: N/A;  . EYE SURGERY Bilateral    cataract surgery  . EYE SURGERY     drains for glaucoma  . INSERTION EXPRESS TUBE SHUNT Right 08/01/2016   Procedure: INSERTION EXPRESS TUBE SHUNT;  Surgeon: Eulogio Bear, MD;  Location: [facility] ORS;  Service: Ophthalmology;  Laterality: Right;  . INSERTION OF AHMED VALVE Left 08/15/2016   Procedure: INSERTION OF AHMED VALVE;  Surgeon: Eulogio Bear, MD;  Location: [facility] ORS;  Service: Ophthalmology;  Laterality: Left;  . INSERTION OF DIALYSIS CATHETER    . TRANSTHORACIC ECHOCARDIOGRAM  January 2017    EF 60-65%. GR 2 DD. Mild degenerative mitral valve disease but no  prolapse or regurgitation. Mild left atrial dilation. Mild to moderate LVH. Pericardial effusion gone  . UPPER EXTREMITY ANGIOGRAPHY Left 06/27/2016   Procedure: Upper Extremity Angiography;  Surgeon: Algernon Huxley, MD;  Location: [place] CV LAB;  Service: Cardiovascular;  Laterality: Left;  . UPPER EXTREMITY ANGIOGRAPHY Left 08/22/2016   Procedure: Upper Extremity Angiography;  Surgeon: Algernon Huxley, MD;  Location: [place] CV LAB;  Service: Cardiovascular;  Laterality: Left;   Patient Active Problem List   Diagnosis Date Noted  . Encephalopathy 02/06/2017  . Hypertensive emergency 02/06/2017  . Endophthalmitis, left eye 12/05/2016  . Vomiting 10/24/2016  . Hematuria 10/17/2016  . Acute lower UTI 10/17/2016  . Anesthesia complication 97/05/6376  . Chest pain with moderate risk for cardiac etiology 09/04/2016  . Steal syndrome dialysis vascular access, initial encounter ([place]) 06/18/2016  . 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 Coronary artery disease involving coronary bypass graft of native heart with angina pectoris ([place]) 02/10/2016  . Hypertension 02/02/2016  . Hyperlipidemia 02/02/2016  . Coronary artery disease involving left main coronary artery 10/04/2015  . Abdominal pain of unknown etiology 10/04/2015  . ESRD (end stage renal disease) on dialysis ([place])   . Type II diabetes mellitus with complication ([place])   . NSTEMI (non-ST elevated myocardial infarction) ([place])   . Right sided abdominal pain   . Elevated troponin 10/03/2015  . 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 Chronic diastolic congestive heart failure ([place]) 04/19/2014  . Stage 5 chronic kidney disease on chronic dialysis ([place]) 04/19/2014  . Anemia 09/20/2013  . Routine health maintenance 01/29/2013      Prior to Admission medications   Medication Sig Start Date End Date Taking? Authorizing Provider  aspirin EC 81 MG tablet Take 1 tablet (81 mg total) by mouth daily. 10/19/16   Dustin Flock, MD    atorvastatin (LIPITOR) 80 MG tablet Take 80 mg by mouth at bedtime.  05/05/17   [provider]  ATROPINE SULFATE-NACL OP Apply 1 drop to eye 2 (two) times daily. Left eye only    [provider]  carvedilol (COREG) 3.125 MG tablet Take 3.125 mg by mouth 2 (two) times daily with a meal.  03/06/17 03/01/18  [provider]  CHOLECALCIFEROL PO Take 3 tablets by mouth 3 (three) times a week. Monday, Wednesday and Friday at dialysis    [provider]  insulin glargine (LANTUS) 100 unit/mL SOPN Inject 7 Units into the skin at bedtime.     [provider]  isosorbide mononitrate (IMDUR) 60 MG 24 hr tablet Take 60 mg by mouth daily.    [provider]  lisinopril (PRINIVIL,ZESTRIL) 10 MG tablet Take 10 mg by mouth daily. 11/04/16 11/04/17  [provider]  metoCLOPramide (REGLAN) 10 MG tablet Take 1 tablet (  10 mg total) by mouth every 6 (six) hours as needed for nausea, vomiting or refractory nausea / vomiting. 10/25/16   Dustin Flock, MD  metoprolol tartrate (LOPRESSOR) 25 MG tablet Take 25 mg by mouth 2 (two) times daily.     [provider]  Multiple Vitamin (MULTIVITAMIN WITH MINERALS) TABS tablet Take 2 tablets by mouth daily. Gummy vitamins    [provider]  nitroGLYCERIN (NITROSTAT) 0.4 MG SL tablet Place 1 tablet (0.4 mg total) under the tongue every 5 (five) minutes as needed for chest pain. 09/05/16   Loletha Grayer, MD  ondansetron (ZOFRAN) 4 MG tablet Take 4 mg by mouth every 8 (eight) hours as needed for nausea or vomiting.    [provider]  polyvinyl alcohol (LIQUIFILM TEARS) 1.4 % ophthalmic solution Place 1 drop into both eyes 2 (two) times daily.     [provider]  predniSONE (DELTASONE) 10 MG tablet Take 10 mg by mouth daily with breakfast. taper    [provider]  ranitidine (ZANTAC) 300 MG tablet Take 1 tablet (300 mg total) by mouth at bedtime. Patient taking differently:  Take 300 mg by mouth daily as needed for heartburn.  09/27/17   Darel Hong, MD  sevelamer carbonate (RENVELA) 800 MG tablet Take 800 mg by mouth 3 (three) times daily with meals.    [provider]  valsartan (DIOVAN) 80 MG tablet Take 80 mg by mouth daily.  07/16/17   [provider]    Allergies Chlorthalidone; Fentanyl; Midazolam; Ace inhibitors; Angiotensin receptor blockers; Norvasc [amlodipine]; and Phenergan [promethazine hcl]    Social History Social History   Tobacco Use  . Smoking status: Never Smoker  . Smokeless tobacco: Never Used  Substance Use Topics  . Alcohol use: No  . Drug use: No    Review of Systems Patient denies headaches, rhinorrhea, blurry vision, numbness, shortness of breath, chest pain, edema, cough, abdominal pain, nausea, vomiting, diarrhea, dysuria, fevers, rashes or hallucinations unless otherwise stated above in HPI. ____________________________________________   PHYSICAL EXAM:  VITAL SIGNS: Vitals:   10/15/17 1318 10/15/17 1322  BP:  (!) 155/71  Pulse:  61  Resp:  18  Temp:  98.1 F (36.7 C)  SpO2: 99% 99%    Constitutional: Alert and oriented.  Eyes: Conjunctivae are normal.  Head: Atraumatic. Nose: No congestion/rhinnorhea. Mouth/Throat: Mucous membranes are moist.   Neck: No stridor. Painless ROM.  Cardiovascular: Normal rate, regular rhythm. Grossly normal heart sounds.  Good peripheral circulation.  Clotted left av fistula chronic,  Right perm cath Respiratory: Normal respiratory effort.  No retractions. Lungs CTAB. Gastrointestinal: Soft and nontender. No distention. No abdominal bruits. No CVA tenderness. Genitourinary:  Musculoskeletal: No lower extremity tenderness nor edema.  No joint effusions. Neurologic:  Normal speech and language. No gross focal neurologic deficits are appreciated. No facial droop Skin:  Skin is warm, dry and intact. No rash noted. Psychiatric: Mood and affect are normal. Speech  and behavior are normal.  ____________________________________________   LABS (all labs ordered are listed, but only abnormal results are displayed)  Results for orders placed or performed during the hospital encounter of 10/15/17 (from the past 24 hour(s))  CBC with Differential/Platelet     Status: Abnormal   Collection Time: 10/15/17  1:29 PM  Result Value Ref Range   WBC 9.2 3.6 - 11.0 K/uL   RBC 3.36 (L) 3.80 - 5.20 MIL/uL   Hemoglobin 10.8 (L) 12.0 - 16.0 g/dL   HCT 32.3 (L)  35.0 - 47.0 %   MCV 96.2 80.0 - 100.0 fL   MCH 32.3 26.0 - 34.0 pg   MCHC 33.6 32.0 - 36.0 g/dL   RDW 15.6 (H) 11.5 - 14.5 %   Platelets 171 150 - 440 K/uL   Neutrophils Relative % 75 %   Neutro Abs 6.9 (H) 1.4 - 6.5 K/uL   Lymphocytes Relative 14 %   Lymphs Abs 1.3 1.0 - 3.6 K/uL   Monocytes Relative 8 %   Monocytes Absolute 0.8 0.2 - 0.9 K/uL   Eosinophils Relative 3 %   Eosinophils Absolute 0.3 0 - 0.7 K/uL   Basophils Relative 0 %   Basophils Absolute 0.0 0 - 0.1 K/uL  Basic metabolic panel     Status: Abnormal   Collection Time: 10/15/17  1:29 PM  Result Value Ref Range   Sodium 135 135 - 145 mmol/L   Potassium 3.3 (L) 3.5 - 5.1 mmol/L   Chloride 97 (L) 98 - 111 mmol/L   CO2 28 22 - 32 mmol/L   Glucose, Bld 176 (H) 70 - 99 mg/dL   BUN 40 (H) 8 - 23 mg/dL   Creatinine, Ser 3.70 (H) 0.44 - 1.00 mg/dL   Calcium 7.8 (L) 8.9 - 10.3 mg/dL   GFR calc non Af Amer 12 (L) >60 mL/min   GFR calc Af Amer 14 (L) >60 mL/min   Anion gap 10 5 - 15   ____________________________________________ ____________________________________________  RADIOLOGY  I personally reviewed all radiographic images ordered to evaluate for the above acute complaints and reviewed radiology reports and findings.  These findings were personally discussed with the patient.  Please see medical record for radiology report.  ____________________________________________   PROCEDURES  Procedure(s) performed:   Procedures    Critical Care performed: no ____________________________________________   INITIAL IMPRESSION / ASSESSMENT AND PLAN / ED COURSE  Pertinent labs & imaging results that were available during my care of the patient were reviewed by me and considered in my medical decision making (see chart for details).   DDX: Migraine, tension, dehydration, dysrhythmia, SAH, SDH, IPH  Brittanie Dosanjh is a 67 y.o. who presents to the ED with symptoms as described above.  Patient afebrile and normotensive.  No tachycardia.  Abdominal exam soft and benign.  Neuro exam is nonfocal.  Will order CT imaging for the concern for the above differential.  Will order IV migraine cocktail and reassess.  Clinical Course as of Oct 15 1533  Wed Oct 15, 2017  1444 Patient reassessed.  CT imaging is reassuring does not show any evidence of acute bleed subdural.  Repeat neuro exam is nonfocal.  Headache resolved after migraine cocktail.  BMP shows no chronic kidney disease with appropriate potassium.  No evidence of infectious process.  Patient stable and appropriate for outpatient follow-up.   [PR]    Clinical Course User Index [PR] Merlyn Lot, MD     As part of my medical decision making, I reviewed the following data within the [facility] notes reviewed and incorporated, Labs reviewed, notes from prior ED visits.   ____________________________________________   FINAL CLINICAL IMPRESSION(S) / ED DIAGNOSES  Final diagnoses:  Acute nonintractable headache, unspecified headache type  ESRD (end stage renal disease) on dialysis ([place])      NEW MEDICATIONS STARTED DURING THIS VISIT:  New Prescriptions   No medications on file     Note:  This document was prepared using Dragon voice recognition software and may include unintentional  dictation errors.    Merlyn Lot, MD 10/15/17 1536

## 2017-10-15 NOTE — Discharge Instructions (Addendum)
As discussed in the emergency department, you may use Tylenol and/or Ibuprofen for headaches. These are "Over the Counter" medications and can be found at most drug stores and grocery stores. Please use the recommended dosing instructions on the bottle/box. Do not exceed the maximum dose for either medications. Please be sure to rest and drink plenty of fluids. Please be sure to call your PCP for a follow-up visit, especially if your headaches persist.  Please call your physician or return to ED if you have: 1. Worsening or change in headaches. 2. Changes in vision. 3. New-onset nausea and vomiting. 4. Numbness, tingling, weakness in your extremities,. 4. Inability to eat or drink adequate amounts of food or liquids. 5. Chest pain, shortness of breath, or difficulty breathing. 6. Neurological changes- dizziness, fainting, loss of function of your arms, legs or other parts of your body. 7. Uncontrolled hypertension. 8. Or any other emergent concerns.

## 2017-10-16 ENCOUNTER — Encounter: Admission: RE | Payer: Self-pay | Source: Ambulatory Visit

## 2017-10-16 ENCOUNTER — Ambulatory Visit: Admission: RE | Admit: 2017-10-16 | Payer: Medicare Other | Source: Ambulatory Visit | Admitting: Vascular Surgery

## 2017-10-16 SURGERY — LAPAROSCOPIC INSERTION CONTINUOUS AMBULATORY PERITONEAL DIALYSIS  (CAPD) CATHETER
Anesthesia: General

## 2017-10-16 MED ORDER — SODIUM CHLORIDE 0.9 % IV SOLN: INTRAVENOUS | Status: DC

## 2017-10-16 MED ORDER — CEFAZOLIN SODIUM-DEXTROSE 2-4 GM/100ML-% IV SOLN: 2.0000 g | INTRAVENOUS | Status: DC

## 2017-10-21 ENCOUNTER — Encounter (INDEPENDENT_AMBULATORY_CARE_PROVIDER_SITE_OTHER): Payer: Self-pay

## 2017-10-21 ENCOUNTER — Other Ambulatory Visit (INDEPENDENT_AMBULATORY_CARE_PROVIDER_SITE_OTHER): Payer: Self-pay | Admitting: Vascular Surgery

## 2017-10-21 NOTE — Pre-Procedure Instructions (Signed)
REQUESTED UPDATED H/P AND ORDERS. PATIENT SEEN IN ED

## 2017-10-30 ENCOUNTER — Ambulatory Visit: Payer: Medicare Other | Admitting: Anesthesiology

## 2017-10-30 ENCOUNTER — Ambulatory Visit
Admission: RE | Admit: 2017-10-30 | Discharge: 2017-10-30 | Disposition: A | Discharge status: Home / Self Care | Payer: Medicare Other | Source: Ambulatory Visit | Attending: Vascular Surgery | Admitting: Vascular Surgery

## 2017-10-30 ENCOUNTER — Encounter
Admission: RE | Disposition: A | Discharge status: Home / Self Care | Payer: Self-pay | Source: Ambulatory Visit | Attending: Vascular Surgery

## 2017-10-30 DIAGNOSIS — Z951 Presence of aortocoronary bypass graft: Secondary | ICD-10-CM | POA: Insufficient documentation

## 2017-10-30 DIAGNOSIS — Z9842 Cataract extraction status, left eye: Secondary | ICD-10-CM | POA: Diagnosis not present

## 2017-10-30 DIAGNOSIS — Z7982 Long term (current) use of aspirin: Secondary | ICD-10-CM | POA: Diagnosis not present

## 2017-10-30 DIAGNOSIS — Z992 Dependence on renal dialysis: Secondary | ICD-10-CM | POA: Diagnosis not present

## 2017-10-30 DIAGNOSIS — Z888 Allergy status to other drugs, medicaments and biological substances status: Secondary | ICD-10-CM | POA: Insufficient documentation

## 2017-10-30 DIAGNOSIS — I2510 Atherosclerotic heart disease of native coronary artery without angina pectoris: Secondary | ICD-10-CM | POA: Diagnosis not present

## 2017-10-30 DIAGNOSIS — N186 End stage renal disease: Secondary | ICD-10-CM | POA: Diagnosis not present

## 2017-10-30 DIAGNOSIS — Z885 Allergy status to narcotic agent status: Secondary | ICD-10-CM | POA: Diagnosis not present

## 2017-10-30 DIAGNOSIS — Z95820 Peripheral vascular angioplasty status with implants and grafts: Secondary | ICD-10-CM | POA: Insufficient documentation

## 2017-10-30 DIAGNOSIS — Z833 Family history of diabetes mellitus: Secondary | ICD-10-CM | POA: Diagnosis not present

## 2017-10-30 DIAGNOSIS — E785 Hyperlipidemia, unspecified: Secondary | ICD-10-CM | POA: Diagnosis not present

## 2017-10-30 DIAGNOSIS — E1122 Type 2 diabetes mellitus with diabetic chronic kidney disease: Secondary | ICD-10-CM | POA: Insufficient documentation

## 2017-10-30 DIAGNOSIS — Z800 Family history of malignant neoplasm of digestive organs: Secondary | ICD-10-CM | POA: Insufficient documentation

## 2017-10-30 DIAGNOSIS — Z9841 Cataract extraction status, right eye: Secondary | ICD-10-CM | POA: Diagnosis not present

## 2017-10-30 DIAGNOSIS — Z961 Presence of intraocular lens: Secondary | ICD-10-CM | POA: Diagnosis not present

## 2017-10-30 DIAGNOSIS — Z794 Long term (current) use of insulin: Secondary | ICD-10-CM | POA: Diagnosis not present

## 2017-10-30 DIAGNOSIS — I132 Hypertensive heart and chronic kidney disease with heart failure and with stage 5 chronic kidney disease, or end stage renal disease: Secondary | ICD-10-CM | POA: Insufficient documentation

## 2017-10-30 DIAGNOSIS — Z7902 Long term (current) use of antithrombotics/antiplatelets: Secondary | ICD-10-CM | POA: Diagnosis not present

## 2017-10-30 DIAGNOSIS — K219 Gastro-esophageal reflux disease without esophagitis: Secondary | ICD-10-CM | POA: Diagnosis not present

## 2017-10-30 DIAGNOSIS — E7800 Pure hypercholesterolemia, unspecified: Secondary | ICD-10-CM | POA: Diagnosis not present

## 2017-10-30 DIAGNOSIS — I252 Old myocardial infarction: Secondary | ICD-10-CM | POA: Insufficient documentation

## 2017-10-30 HISTORY — PX: CAPD INSERTION: SHX5233

## 2017-10-30 LAB — CBC WITH DIFFERENTIAL/PLATELET
Basophils Absolute: 0 10*3/uL (ref 0–0.1)
Basophils Relative: 0 %
Eosinophils Absolute: 0.1 10*3/uL (ref 0–0.7)
Eosinophils Relative: 2 %
HCT: 27.1 % — ABNORMAL LOW (ref 35.0–47.0)
Hemoglobin: 9 g/dL — ABNORMAL LOW (ref 12.0–16.0)
Lymphocytes Relative: 15 %
Lymphs Abs: 0.9 10*3/uL — ABNORMAL LOW (ref 1.0–3.6)
MCH: 32.7 pg (ref 26.0–34.0)
MCHC: 33.3 g/dL (ref 32.0–36.0)
MCV: 98.4 fL (ref 80.0–100.0)
Monocytes Absolute: 0.5 10*3/uL (ref 0.2–0.9)
Monocytes Relative: 8 %
Neutro Abs: 4.5 10*3/uL (ref 1.4–6.5)
Neutrophils Relative %: 75 %
Platelets: 196 10*3/uL (ref 150–440)
RBC: 2.76 MIL/uL — ABNORMAL LOW (ref 3.80–5.20)
RDW: 16.5 % — ABNORMAL HIGH (ref 11.5–14.5)
WBC: 6.1 10*3/uL (ref 3.6–11.0)

## 2017-10-30 LAB — BASIC METABOLIC PANEL
Anion gap: 10 (ref 5–15)
BUN: 23 mg/dL (ref 8–23)
CO2: 32 mmol/L (ref 22–32)
Calcium: 8.9 mg/dL (ref 8.9–10.3)
Chloride: 99 mmol/L (ref 98–111)
Creatinine, Ser: 4.26 mg/dL — ABNORMAL HIGH (ref 0.44–1.00)
GFR calc Af Amer: 12 mL/min — ABNORMAL LOW (ref >60)
GFR calc non Af Amer: 10 mL/min — ABNORMAL LOW (ref >60)
Glucose, Bld: 118 mg/dL — ABNORMAL HIGH (ref 70–99)
Potassium: 4.7 mmol/L (ref 3.5–5.1)
Sodium: 141 mmol/L (ref 135–145)

## 2017-10-30 LAB — PROTIME-INR
INR: 1.16
Prothrombin Time: 14.7 seconds (ref 11.4–15.2)

## 2017-10-30 LAB — POCT I-STAT 4, (NA,K, GLUC, HGB,HCT)
Glucose, Bld: 108 mg/dL — ABNORMAL HIGH (ref 70–99)
HCT: 27 % — ABNORMAL LOW (ref 36.0–46.0)
Hemoglobin: 9.2 g/dL — ABNORMAL LOW (ref 12.0–15.0)
Potassium: 4.5 mmol/L (ref 3.5–5.1)
Sodium: 140 mmol/L (ref 135–145)

## 2017-10-30 LAB — GLUCOSE, CAPILLARY: Glucose-Capillary: 110 mg/dL — ABNORMAL HIGH (ref 70–99)

## 2017-10-30 LAB — TYPE AND SCREEN
ABO/RH(D): B POS
Antibody Screen: NEGATIVE

## 2017-10-30 LAB — APTT: aPTT: 30 seconds (ref 24–36)

## 2017-10-30 SURGERY — LAPAROSCOPIC INSERTION CONTINUOUS AMBULATORY PERITONEAL DIALYSIS  (CAPD) CATHETER
Anesthesia: General | Site: Abdomen | Wound class: Clean

## 2017-10-30 MED ORDER — SUGAMMADEX SODIUM 200 MG/2ML IV SOLN
INTRAVENOUS | Status: AC
  Filled 2017-10-30: qty 2

## 2017-10-30 MED ORDER — ROCURONIUM BROMIDE 100 MG/10ML IV SOLN
INTRAVENOUS | Status: DC | PRN
  Administered 2017-10-30: 30 mg via INTRAVENOUS
  Administered 2017-10-30: 10 mg via INTRAVENOUS

## 2017-10-30 MED ORDER — OXYCODONE HCL 5 MG/5ML PO SOLN: 5.0000 mg | Freq: Once | ORAL | Status: DC | PRN

## 2017-10-30 MED ORDER — CHLORHEXIDINE GLUCONATE CLOTH 2 % EX PADS: 6.0000 | MEDICATED_PAD | Freq: Once | CUTANEOUS | Status: DC

## 2017-10-30 MED ORDER — ONDANSETRON HCL 4 MG/2ML IJ SOLN
INTRAMUSCULAR | Status: AC
  Administered 2017-10-30: 4 mg via INTRAVENOUS
  Filled 2017-10-30: qty 2

## 2017-10-30 MED ORDER — HYDROCODONE-ACETAMINOPHEN 5-325 MG PO TABS
1.0000 | ORAL_TABLET | Freq: Four times a day (QID) | ORAL | Status: DC | PRN
  Administered 2017-10-30: 1 via ORAL

## 2017-10-30 MED ORDER — SODIUM CHLORIDE 0.9 % IV SOLN
INTRAVENOUS | Status: DC
  Administered 2017-10-30: 15:00:00 via INTRAVENOUS

## 2017-10-30 MED ORDER — OXYCODONE HCL 5 MG PO TABS: 5.0000 mg | ORAL_TABLET | Freq: Once | ORAL | Status: DC | PRN

## 2017-10-30 MED ORDER — ACETAMINOPHEN 10 MG/ML IV SOLN
1000.0000 mg | Freq: Once | INTRAVENOUS | Status: AC
  Administered 2017-10-30: 1000 mg via INTRAVENOUS

## 2017-10-30 MED ORDER — HYDROMORPHONE HCL 1 MG/ML IJ SOLN
INTRAMUSCULAR | Status: AC
  Filled 2017-10-30: qty 1

## 2017-10-30 MED ORDER — HYDROCODONE-ACETAMINOPHEN 5-325 MG PO TABS: 1.0000 | ORAL_TABLET | Freq: Four times a day (QID) | ORAL | 0 refills | Status: DC | PRN

## 2017-10-30 MED ORDER — CEFAZOLIN SODIUM-DEXTROSE 2-4 GM/100ML-% IV SOLN
INTRAVENOUS | Status: AC
  Filled 2017-10-30: qty 100

## 2017-10-30 MED ORDER — SUGAMMADEX SODIUM 200 MG/2ML IV SOLN
INTRAVENOUS | Status: DC | PRN
  Administered 2017-10-30: 110 mg via INTRAVENOUS

## 2017-10-30 MED ORDER — BUPIVACAINE HCL (PF) 0.25 % IJ SOLN
INTRAMUSCULAR | Status: AC
Start: 2017-10-30 — End: ?
  Filled 2017-10-30: qty 30

## 2017-10-30 MED ORDER — HYDROCODONE-ACETAMINOPHEN 5-325 MG PO TABS
ORAL_TABLET | ORAL | Status: AC
  Administered 2017-10-30: 1 via ORAL
  Filled 2017-10-30: qty 1

## 2017-10-30 MED ORDER — BUPIVACAINE HCL (PF) 0.25 % IJ SOLN
INTRAMUSCULAR | Status: DC | PRN
  Administered 2017-10-30: 10 mL

## 2017-10-30 MED ORDER — PROPOFOL 10 MG/ML IV BOLUS
INTRAVENOUS | Status: DC | PRN
  Administered 2017-10-30: 120 mg via INTRAVENOUS

## 2017-10-30 MED ORDER — ONDANSETRON HCL 4 MG/2ML IJ SOLN
4.0000 mg | Freq: Once | INTRAMUSCULAR | Status: AC
  Administered 2017-10-30: 4 mg via INTRAVENOUS

## 2017-10-30 MED ORDER — HYDRALAZINE HCL 20 MG/ML IJ SOLN
INTRAMUSCULAR | Status: AC
  Administered 2017-10-30: 20 mg via INTRAVENOUS
  Filled 2017-10-30: qty 1

## 2017-10-30 MED ORDER — ACETAMINOPHEN 10 MG/ML IV SOLN
INTRAVENOUS | Status: AC
  Administered 2017-10-30: 1000 mg via INTRAVENOUS
  Filled 2017-10-30: qty 100

## 2017-10-30 MED ORDER — LIDOCAINE HCL (CARDIAC) PF 100 MG/5ML IV SOSY
PREFILLED_SYRINGE | INTRAVENOUS | Status: DC | PRN
  Administered 2017-10-30: 60 mg via INTRAVENOUS

## 2017-10-30 MED ORDER — CEFAZOLIN SODIUM-DEXTROSE 2-4 GM/100ML-% IV SOLN
2.0000 g | INTRAVENOUS | Status: AC
  Administered 2017-10-30: 2 g via INTRAVENOUS

## 2017-10-30 MED ORDER — ONDANSETRON HCL 4 MG/2ML IJ SOLN
INTRAMUSCULAR | Status: AC
  Filled 2017-10-30: qty 2

## 2017-10-30 MED ORDER — ONDANSETRON HCL 4 MG/2ML IJ SOLN
INTRAMUSCULAR | Status: DC | PRN
  Administered 2017-10-30: 4 mg via INTRAVENOUS

## 2017-10-30 MED ORDER — HYDRALAZINE HCL 20 MG/ML IJ SOLN
10.0000 mg | Freq: Once | INTRAMUSCULAR | Status: AC
  Administered 2017-10-30: 20 mg via INTRAVENOUS

## 2017-10-30 SURGICAL SUPPLY — 33 items
ADAPTER BETA CAP QUINTON DIALY (ADAPTER) IMPLANT
ADAPTER CATH DIALYSIS 18.75 (CATHETERS) ×2 IMPLANT
CANISTER SUCT 1200ML W/VALVE (MISCELLANEOUS) ×2 IMPLANT
CATH DLYS SWAN NECK 62.5CM (CATHETERS) ×2 IMPLANT
CHLORAPREP W/TINT 26ML (MISCELLANEOUS) ×2 IMPLANT
DERMABOND ADVANCED (GAUZE/BANDAGES/DRESSINGS) ×1
DERMABOND ADVANCED .7 DNX12 (GAUZE/BANDAGES/DRESSINGS) ×1 IMPLANT
ELECT CAUTERY BLADE 6.4 (BLADE) ×2 IMPLANT
ELECT REM PT RETURN 9FT ADLT (ELECTROSURGICAL) ×2
ELECTRODE REM PT RTRN 9FT ADLT (ELECTROSURGICAL) ×1 IMPLANT
GLOVE BIO SURGEON STRL SZ7 (GLOVE) ×4 IMPLANT
GLOVE INDICATOR 7.5 STRL GRN (GLOVE) ×2 IMPLANT
GOWN STRL REUS W/ TWL LRG LVL3 (GOWN DISPOSABLE) ×1 IMPLANT
GOWN STRL REUS W/ TWL XL LVL3 (GOWN DISPOSABLE) ×2 IMPLANT
GOWN STRL REUS W/TWL LRG LVL3 (GOWN DISPOSABLE) ×1
GOWN STRL REUS W/TWL XL LVL3 (GOWN DISPOSABLE) ×2
IV NS 500ML (IV SOLUTION) ×1
IV NS 500ML BAXH (IV SOLUTION) ×1 IMPLANT
KIT TURNOVER KIT A (KITS) ×2 IMPLANT
LABEL OR SOLS (LABEL) ×2 IMPLANT
MINICAP W/POVIDONE IODINE SOL (MISCELLANEOUS) ×2 IMPLANT
PACK LAP CHOLECYSTECTOMY (MISCELLANEOUS) ×2 IMPLANT
PENCIL ELECTRO HAND CTR (MISCELLANEOUS) ×2 IMPLANT
SET CYSTO W/LG BORE CLAMP LF (SET/KITS/TRAYS/PACK) ×2 IMPLANT
SET TRANSFER 6 W/TWIST CLAMP 5 (SET/KITS/TRAYS/PACK) ×2 IMPLANT
SPONGE DRAIN TRACH 4X4 STRL 2S (GAUZE/BANDAGES/DRESSINGS) ×2 IMPLANT
SPONGE VERSALON 4X4 4PLY (MISCELLANEOUS) ×2 IMPLANT
SUT MNCRL AB 4-0 PS2 18 (SUTURE) ×2 IMPLANT
SUT VIC AB 2-0 UR6 27 (SUTURE) ×2 IMPLANT
SUT VICRYL+ 3-0 36IN CT-1 (SUTURE) ×2 IMPLANT
TROCAR XCEL NON-BLD 11X100MML (ENDOMECHANICALS) ×2 IMPLANT
TROCAR XCEL NON-BLD 5MMX100MML (ENDOMECHANICALS) IMPLANT
TUBING INSUFFLATION (TUBING) ×2 IMPLANT

## 2017-10-30 NOTE — Anesthesia Post-op Follow-up Note (Signed)
Anesthesia QCDR form completed.        

## 2017-10-30 NOTE — Transfer of Care (Signed)
Immediate Anesthesia Transfer of Care Note  Patient: Stephanie Ellis  Procedure(s) Performed: LAPAROSCOPIC INSERTION CONTINUOUS AMBULATORY PERITONEAL DIALYSIS  (CAPD) CATHETER (N/A Abdomen)  Patient Location: PACU  Anesthesia Type:General  Level of Consciousness: awake  Airway & Oxygen Therapy: Patient connected to face mask oxygen  Post-op Assessment: Post -op Vital signs reviewed and stable  Post vital signs: stable  Last Vitals:  Vitals Value Taken Time  BP 181/82 10/30/2017  6:29 PM  Temp 36.6 C 10/30/2017  6:29 PM  Pulse 60 10/30/2017  6:29 PM  Resp 17 10/30/2017  6:29 PM  SpO2 97 % 10/30/2017  6:29 PM    Last Pain:  Vitals:   10/30/17 1421  TempSrc: Oral  PainSc: 0-No pain         Complications: No apparent anesthesia complications

## 2017-10-30 NOTE — Discharge Instructions (Signed)

## 2017-10-30 NOTE — Anesthesia Preprocedure Evaluation (Addendum)
Anesthesia Evaluation  Patient identified by MRN, date of birth, ID band Patient awake    Reviewed: Allergy & Precautions, H&P , NPO status , Patient's Chart, lab work & pertinent test results  History of Anesthesia Complications Negative for: history of anesthetic complications  Airway Mallampati: III  TM Distance: <3 FB Neck ROM: limited    Dental  (+) Chipped, Poor Dentition, Missing   Pulmonary neg shortness of breath,           Cardiovascular Exercise Tolerance: Good hypertension, (-) angina+ CAD, + Past MI and +CHF  + dysrhythmias + Valvular Problems/Murmurs      Neuro/Psych negative neurological ROS  negative psych ROS   GI/Hepatic Neg liver ROS, GERD  Medicated and Controlled,  Endo/Other  diabetes, Type 2  Renal/GU Renal disease     Musculoskeletal   Abdominal   Peds  Hematology negative hematology ROS (+)   Anesthesia Other Findings Past Medical History: No date: Anemia No date: Anorexia No date: Bacteremia due to Pseudomonas No date: CHF (congestive heart failure) ([place])     Comment:  has not happened recently 67/2094: Complication of anesthesia     Comment:  WITH LAST STENT 10/17. PASsED OUT AND HAD TO BE AWAKENED 01/2015: Coronary artery disease involving left main coronary artery     Comment:  [facility]: 70% LM, p-mLAD 50-60% (Resting FFR 0.75), mRCA               80-90%, ~40 Ost OM & D1 No date: Dysrhythmia No date: ESRD (end stage renal disease) on dialysis ([place])     Comment:  ESRD secondary to acute kidney failure s/p CABG                DIALYSIS M/W/F No date: Essential hypertension No date: GERD (gastroesophageal reflux disease) No date: Heart murmur     Comment:  no treatment No date: Hypercholesterolemia 2016: Myocardial infarction ([place]) 03/24/2015: S/P CABG x 3     Comment:   [facility]: Dr. Marland Kitchen Ha[facility]cock: CABG x 3, LIMA to LAD,               SVG to RCA, SVG to OM3, [facility] 2019:  Sinusitis No date: Type II diabetes mellitus with complication ([place])     Comment:  CAD  Past Surgical History: No date: ABDOMINAL HYSTERECTOMY 05/2016: ARTERIOVENOUS GRAFT PLACEMENT 05/30/2016: AV FISTULA PLACEMENT; Left     Comment:  Procedure: ARTERIOVENOUS graft;  Surgeon: Algernon Huxley,               MD;  Location: [facility] ORS;  Service: Vascular;  Laterality:              Left; 01/2015: CARDIAC CATHETERIZATION     Comment:  [facility]: Ost LM 70%, p-m LAD 50-60% (Rest FFR + @ 0.75),               mRCA 80-90%, ostD1 40%, pOM1 40% 01/18/2016: CATARACT EXTRACTION W/PHACO; Left     Comment:  Procedure: CATARACT EXTRACTION PHACO AND INTRAOCULAR               LENS PLACEMENT (IOC);  Surgeon: Eulogio Bear, MD;                Location: [facility] ORS;  Service: Ophthalmology;  Laterality:              Left;  [place] 1.05 AP% 15.5 CDE 10.16 Fluid Pack Lot #  [id number] 08/01/2016: CATARACT EXTRACTION W/PHACO; Right     Comment:  Procedure: CATARACT EXTRACTION PHACO AND INTRAOCULAR               LENS PLACEMENT (IOC);  Surgeon: Eulogio Bear, MD;                Location: [facility] ORS;  Service: Ophthalmology;  Laterality:              Right;  [place] 01:00.6 AP% 11.4 CDE 6.93  LOT # [id number] No date: COLONOSCOPY No date: CORONARY ANGIOPLASTY     Comment:  [facility]ENTS 02/12/16 03/28/15: CORONARY ARTERY BYPASS GRAFT     Comment:   [facility]: Dr. Waldemar Dickens: LIMA to LAD, SVG to RCA, SVG to               OM3, [facility] 10/24/2016: ESOPHAGOGASTRODUODENOSCOPY (EGD) WITH PROPOFOL; N/A     Comment:  Procedure: ESOPHAGOGASTRODUODENOSCOPY (EGD) WITH               PROPOFOL;  Surgeon: [name], MD;  Location: [facility]               ENDOSCOPY;  Service: Endoscopy;  Laterality: N/A; No date: EYE SURGERY; Bilateral     Comment:  cataract surgery No date: EYE SURGERY     Comment:  drains for glaucoma 08/01/2016: INSERTION EXPRESS TUBE SHUNT; Right     Comment:  Procedure: INSERTION EXPRESS TUBE SHUNT;  Surgeon:                Eulogio Bear, MD;  Location: [facility] ORS;  Service:               Ophthalmology;  Laterality: Right; 08/15/2016: INSERTION OF AHMED VALVE; Left     Comment:  Procedure: INSERTION OF AHMED VALVE;  Surgeon: Eulogio Bear, MD;  Location: [facility] ORS;  Service:               Ophthalmology;  Laterality: Left; No date: INSERTION OF DIALYSIS CATHETER January 2017: TRANSTHORACIC ECHOCARDIOGRAM     Comment:   EF 60-65%. GR 2 DD. Mild degenerative mitral valve               disease but no prolapse or regurgitation. Mild left               atrial dilation. Mild to moderate LVH. Pericardial               effusion gone 06/27/2016: UPPER EXTREMITY ANGIOGRAPHY; Left     Comment:  Procedure: Upper Extremity Angiography;  Surgeon: Algernon Huxley, MD;  Location: [place] CV [facility];  Service:               Cardiovascular;  Laterality: Left; 08/22/2016: UPPER EXTREMITY ANGIOGRAPHY; Left     Comment:  Procedure: Upper Extremity Angiography;  Surgeon: Algernon Huxley, MD;  Location: [place] [facility];  Service:               Cardiovascular;  Laterality: Left;     Reproductive/Obstetrics negative OB ROS                             Anesthesia Physical Anesthesia Plan  ASA: IV  Anesthesia Plan: General ETT   Post-op Pain Management:    Induction: Intravenous  PONV Risk Score and Plan: Ondansetron, Dexamethasone and Treatment may vary due to age or medical condition  Airway Management Planned: Oral ETT  Additional Equipment:   Intra-op Plan:   Post-operative Plan: Extubation in OR  Informed Consent: I have reviewed the patients History and Physical, chart, labs and discussed the procedure including the risks, benefits and alternatives for the proposed anesthesia with the patient or authorized representative who has indicated his/her understanding and acceptance.   Dental Advisory Given  Plan Discussed with: Anesthesiologist, CRNA  and Surgeon  Anesthesia Plan Comments: (Patient consented for risks of anesthesia including but not limited to:  - adverse reactions to medications - damage to teeth, lips or other oral mucosa - sore throat or hoarseness - Damage to heart, brain, lungs or loss of life  Patient voiced understanding.)       Anesthesia Quick Evaluation

## 2017-10-30 NOTE — H&P (Signed)
[facility] History & Physical Update  The patient was interviewed and re-examined.  The patient's previous History and Physical has been reviewed and is unchanged.  There is no change in the plan of care. We plan to proceed with the scheduled procedure.  Leotis Pain, MD  10/30/2017, 4:26 PM

## 2017-10-30 NOTE — Anesthesia Procedure Notes (Signed)
Procedure Name: Intubation Date/Time: 10/30/2017 5:36 PM Performed by: Aline Brochure, CRNA Pre-anesthesia Checklist: Patient identified, Emergency Drugs available, Suction available and Patient being monitored Patient Re-evaluated:Patient Re-evaluated prior to induction Oxygen Delivery Method: Circle system utilized Preoxygenation: Pre-oxygenation with 100% oxygen Induction Type: IV induction Ventilation: Oral airway inserted - appropriate to patient size and Mask ventilation without difficulty Laryngoscope Size: McGraph and 3 Grade View: Grade I Tube type: Oral Tube size: 7.0 mm Number of attempts: 1 Airway Equipment and Method: Stylet and Video-laryngoscopy Placement Confirmation: ETT inserted through vocal cords under direct vision,  positive ETCO2 and breath sounds checked- equal and bilateral Secured at: 20 cm Tube secured with: Tape Dental Injury: Teeth and Oropharynx as per pre-operative assessment

## 2017-10-30 NOTE — Op Note (Signed)
  OPERATIVE NOTE   PROCEDURE: 1. Laparoscopic peritoneal dialysis catheter placement.  PRE-OPERATIVE DIAGNOSIS: 1. ESRD   POST-OPERATIVE DIAGNOSIS: Same  SURGEON: Leotis Pain, MD  ASSISTANT(S): None  ANESTHESIA: general  ESTIMATED BLOOD LOSS: Minimal   FINDING(S): 1. None  SPECIMEN(S): None  INDICATIONS:  Patient presents with renal failure. The patient has decided to do peritoneal dialysis for his long-term dialysis. Risks and benefits of placement were discussed and he is agreeable to proceed.  Differences between peritoneal dialysis and hemodialysis were discussed.    DESCRIPTION: After obtaining full informed written consent, the patient was brought back to the operating room and placed supine upon the operating table. The patient received IV antibiotics prior to induction. After obtaining adequate anesthesia, the abdomen was prepped and draped in the standard fashion. A small transverse incision was created just to the left of the umbilicus and we dissected down to the fascia and placed a pursestring Vicryl suture. I then entered the peritoneum with an 58m Optiview trocar placed in the right upper quadrant and insufflated the abdomen with carbon dioxide. I then entered the peritoneum just beside the umbilicus with a trocar and the peritoneal dialysis catheter under direct visualization. The coiled portion of the catheter was parked into the pelvis under direct laparoscopic guidance. There were adhesions to the midline that we had to navigate around. The deep cuff was secured to the fascial pursestring suture. A small counterincision was made in the left abdomen and the catheter was brought out this site. The appropriate distal connectors were placed, and I then placed 500 cc of saline through the catheter into the pelvis. The abdomen was desufflated. Immediately, over 400 cc of effluent returned through the catheter when the bag was placed to gravity. I took one more look with  the camera to ensure that the catheter was in the pelvis and it was. The 112mtrocar was then removed. I then closed the incisions with 3-0 Vicryl and 4-0 Monocryl and placed Dermabond as dressing. Dry dressing was placed around the catheter exit site. The patient was then awakened from anesthesia and taken to the recovery room in stable condition having tolerated the procedure well.  COMPLICATIONS: None  CONDITION: None  JaLeotis PainMD 10/30/2017 6:19 PM   This note was created with Dragon Medical transcription system. Any errors in dictation are purely unintentional.

## 2017-10-31 ENCOUNTER — Encounter: Payer: Self-pay | Admitting: Vascular Surgery

## 2017-10-31 NOTE — Anesthesia Postprocedure Evaluation (Signed)
Anesthesia Post Note  Patient: Stephanie Ellis  Procedure(s) Performed: LAPAROSCOPIC INSERTION CONTINUOUS AMBULATORY PERITONEAL DIALYSIS  (CAPD) CATHETER (N/A Abdomen)  Patient location during evaluation: PACU Anesthesia Type: General Level of consciousness: awake and alert Pain management: pain level controlled Vital Signs Assessment: post-procedure vital signs reviewed and stable Respiratory status: spontaneous breathing, nonlabored ventilation, respiratory function stable and patient connected to nasal cannula oxygen Cardiovascular status: blood pressure returned to baseline and stable Postop Assessment: no apparent nausea or vomiting Anesthetic complications: no     Last Vitals:  Vitals:   10/30/17 1940 10/30/17 1950  BP:  (!) 99/54  Pulse: 69 69  Resp: 12 12  Temp:  (!) 36.3 C  SpO2: 93% 95%    Last Pain:  Vitals:   10/30/17 1950  TempSrc:   PainSc: 1                  Precious Haws [place]

## 2017-11-04 ENCOUNTER — Ambulatory Visit: Payer: Medicare Other | Admitting: Podiatry

## 2017-11-11 ENCOUNTER — Other Ambulatory Visit: Payer: Self-pay | Admitting: Otolaryngology

## 2017-11-11 DIAGNOSIS — H9311 Tinnitus, right ear: Secondary | ICD-10-CM

## 2017-11-11 DIAGNOSIS — R42 Dizziness and giddiness: Secondary | ICD-10-CM

## 2017-11-15 ENCOUNTER — Ambulatory Visit
Admission: RE | Admit: 2017-11-15 | Discharge: 2017-11-15 | Disposition: A | Discharge status: Home / Self Care | Payer: Medicare Other | Source: Ambulatory Visit | Attending: Otolaryngology | Admitting: Otolaryngology

## 2017-11-15 DIAGNOSIS — I6782 Cerebral ischemia: Secondary | ICD-10-CM | POA: Diagnosis not present

## 2017-11-15 DIAGNOSIS — H903 Sensorineural hearing loss, bilateral: Secondary | ICD-10-CM | POA: Insufficient documentation

## 2017-11-15 DIAGNOSIS — H9311 Tinnitus, right ear: Secondary | ICD-10-CM | POA: Diagnosis present

## 2017-11-15 DIAGNOSIS — R42 Dizziness and giddiness: Secondary | ICD-10-CM | POA: Insufficient documentation

## 2017-11-25 ENCOUNTER — Ambulatory Visit (INDEPENDENT_AMBULATORY_CARE_PROVIDER_SITE_OTHER): Payer: Medicare Other | Admitting: Podiatry

## 2017-11-25 ENCOUNTER — Encounter: Payer: Self-pay | Admitting: Podiatry

## 2017-11-25 DIAGNOSIS — I70235 Atherosclerosis of native arteries of right leg with ulceration of other part of foot: Secondary | ICD-10-CM

## 2017-11-25 DIAGNOSIS — L97522 Non-pressure chronic ulcer of other part of left foot with fat layer exposed: Secondary | ICD-10-CM | POA: Diagnosis not present

## 2017-11-25 DIAGNOSIS — E0843 Diabetes mellitus due to underlying condition with diabetic autonomic (poly)neuropathy: Secondary | ICD-10-CM

## 2017-11-28 NOTE — Progress Notes (Signed)
   Subjective:  67 year old female presenting today for follow up evaluation of an ulceration of the left hallux. She states she is doing well and denies any pain or new complaints at this time. Patient is here for further evaluation and treatment.   Past Medical History:  Diagnosis Date  . Anemia   . Anorexia   . Bacteremia due to Pseudomonas   . CHF (congestive heart failure) ([facility])    has not happened recently  . Complication of anesthesia 01/2016   WITH LAST STENT 10/17. PASsED OUT AND HAD TO BE AWAKENED  . Coronary artery disease involving left main coronary artery 01/2015   [facility]: 70% LM, p-mLAD 50-60% (Resting FFR 0.75), mRCA 80-90%, ~40 Ost OM & D1  . Dysrhythmia   . ESRD (end stage renal disease) on dialysis [facility])    ESRD secondary to acute kidney failure s/p CABG  DIALYSIS M/W/F  . Essential hypertension   . GERD (gastroesophageal reflux disease)   . Heart murmur    no treatment  . Hypercholesterolemia   . Myocardial infarction ([place]) 2016  . 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 Marland Kitchen Haithcock: CABG x 3, LIMA to LAD, SVG to RCA, SVG to OM3, [facility]  . Sinusitis 2019  . Type II diabetes mellitus with complication ([facility])    CAD      Objective/Physical Exam General: The patient is alert and oriented x3 in no acute distress.  Dermatology:  Wound #1 noted to the left hallux measuring 2.0 x 1.0 x 0.3 cm (LxWxD).   To the noted ulceration(s), there is no eschar. There is a moderate amount of slough, fibrin, and necrotic tissue noted. Granulation tissue and wound base is red. There is a minimal amount of serosanguineous drainage noted. There is no exposed bone muscle-tendon ligament or joint. There is no malodor. Periwound integrity is intact. Skin is warm, dry and supple bilateral lower extremities.  Vascular: Palpable pedal pulses bilaterally. No edema or erythema noted. Capillary refill within normal limits.  Neurological: Epicritic and protective threshold diminished  bilaterally.   Musculoskeletal Exam: Range of motion within normal limits to all pedal and ankle joints bilateral. Muscle strength 5/5 in all groups bilateral.   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 #2 Medically necessary excisional debridement including muscle and deep fascial tissue was performed using a tissue nipper and a chisel blade. Excisional debridement of all the necrotic nonviable tissue down to healthy bleeding viable tissue was performed with post-debridement measurements same as pre-. #3 The wound was cleansed and dry sterile dressing applied. #4 Continue using Hydrogel and dry sterile dressing daily.  #5 Continue management with Dr. Lucky Cowboy, vascular.  #6 Return to clinic in 4 weeks.    Edrick Kins, DPM [facility]  Dr. Edrick Kins, [address]                                        [place], [place] [address]                Office [phone]  Fax [phone]

## 2017-12-02 ENCOUNTER — Ambulatory Visit (INDEPENDENT_AMBULATORY_CARE_PROVIDER_SITE_OTHER): Payer: Medicare Other | Admitting: Vascular Surgery

## 2017-12-23 ENCOUNTER — Ambulatory Visit (INDEPENDENT_AMBULATORY_CARE_PROVIDER_SITE_OTHER): Payer: Medicare Other | Admitting: Podiatry

## 2017-12-23 ENCOUNTER — Encounter: Payer: Self-pay | Admitting: Podiatry

## 2017-12-23 DIAGNOSIS — L97522 Non-pressure chronic ulcer of other part of left foot with fat layer exposed: Secondary | ICD-10-CM | POA: Diagnosis not present

## 2017-12-23 DIAGNOSIS — E0843 Diabetes mellitus due to underlying condition with diabetic autonomic (poly)neuropathy: Secondary | ICD-10-CM

## 2017-12-23 DIAGNOSIS — I70235 Atherosclerosis of native arteries of right leg with ulceration of other part of foot: Secondary | ICD-10-CM

## 2017-12-26 NOTE — Progress Notes (Signed)
   Subjective:  67 year old female presenting today for follow up evaluation of an ulceration of the left hallux. She states the area looks improved. She has been using Hydrogel as directed. She denies any pain. Patient is here for further evaluation and treatment.   Past Medical History:  Diagnosis Date  . Anemia   . Anorexia   . Bacteremia due to Pseudomonas   . CHF (congestive heart failure) ([facility])    has not happened recently  . Complication of anesthesia 01/2016   WITH LAST STENT 10/17. PASsED OUT AND HAD TO BE AWAKENED  . Coronary artery disease involving left main coronary artery 01/2015   [facility]: 70% LM, p-mLAD 50-60% (Resting FFR 0.75), mRCA 80-90%, ~40 Ost OM & D1  . Dysrhythmia   . ESRD (end stage renal disease) on dialysis [facility])    ESRD secondary to acute kidney failure s/p CABG  DIALYSIS M/W/F  . Essential hypertension   . GERD (gastroesophageal reflux disease)   . Heart murmur    no treatment  . Hypercholesterolemia   . Myocardial infarction ([place]) 2016  . 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 Marland Kitchen Haithcock: CABG x 3, LIMA to LAD, SVG to RCA, SVG to OM3, [facility]  . Sinusitis 2019  . Type II diabetes mellitus with complication ([facility])    CAD      Objective/Physical Exam General: The patient is alert and oriented x3 in no acute distress.  Dermatology:  Wound #1 noted to the left hallux measuring 1.7 x 0.8 x 0.2 cm (LxWxD).   To the noted ulceration(s), there is no eschar. There is a moderate amount of slough, fibrin, and necrotic tissue noted. Granulation tissue and wound base is red. There is a minimal amount of serosanguineous drainage noted. There is no exposed bone muscle-tendon ligament or joint. There is no malodor. Periwound integrity is intact. Skin is warm, dry and supple bilateral lower extremities.  Vascular: Palpable pedal pulses bilaterally. No edema or erythema noted. Capillary refill within normal limits.  Neurological: Epicritic and protective threshold  diminished bilaterally.   Musculoskeletal Exam: Range of motion within normal limits to all pedal and ankle joints bilateral. Muscle strength 5/5 in all groups bilateral.   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 #2 #2 Medically necessary excisional debridement including subcutaneous tissue was performed using a tissue nipper and a chisel blade. Excisional debridement of all the necrotic nonviable tissue down to healthy bleeding viable tissue was performed with post-debridement measurements same as pre-. #3 The wound was cleansed and dry sterile dressing applied. #4 Continue using Hydrogel and dry sterile dressing daily.  #5 Continue management with Dr. Lucky Cowboy, vascular.  #6 Return to clinic in 3 weeks.    Edrick Kins, DPM [facility]  Dr. Edrick Kins, [address]                                        [place], [place] [address]                Office [phone]  Fax [phone]

## 2018-01-16 ENCOUNTER — Ambulatory Visit: Payer: Medicare Other | Admitting: Podiatry

## 2018-01-27 ENCOUNTER — Ambulatory Visit (INDEPENDENT_AMBULATORY_CARE_PROVIDER_SITE_OTHER): Payer: Medicare Other | Admitting: Vascular Surgery

## 2018-01-29 ENCOUNTER — Emergency Department: Payer: Medicare Other

## 2018-01-29 ENCOUNTER — Inpatient Hospital Stay
Admission: EM | Admit: 2018-01-29 | Discharge: 2018-02-15 | DRG: 252 | Disposition: A | Discharge status: Skilled Nursing Facility | Payer: Medicare Other | Attending: Internal Medicine | Admitting: Internal Medicine

## 2018-01-29 ENCOUNTER — Encounter: Payer: Self-pay | Admitting: Emergency Medicine

## 2018-01-29 ENCOUNTER — Other Ambulatory Visit: Payer: Self-pay

## 2018-01-29 DIAGNOSIS — M869 Osteomyelitis, unspecified: Secondary | ICD-10-CM | POA: Diagnosis not present

## 2018-01-29 DIAGNOSIS — D631 Anemia in chronic kidney disease: Secondary | ICD-10-CM | POA: Diagnosis present

## 2018-01-29 DIAGNOSIS — Y832 Surgical operation with anastomosis, bypass or graft as the cause of abnormal reaction of the patient, or of later complication, without mention of misadventure at the time of the procedure: Secondary | ICD-10-CM | POA: Diagnosis present

## 2018-01-29 DIAGNOSIS — I132 Hypertensive heart and chronic kidney disease with heart failure and with stage 5 chronic kidney disease, or end stage renal disease: Secondary | ICD-10-CM | POA: Diagnosis present

## 2018-01-29 DIAGNOSIS — I953 Hypotension of hemodialysis: Secondary | ICD-10-CM | POA: Diagnosis not present

## 2018-01-29 DIAGNOSIS — E1122 Type 2 diabetes mellitus with diabetic chronic kidney disease: Secondary | ICD-10-CM | POA: Diagnosis present

## 2018-01-29 DIAGNOSIS — E875 Hyperkalemia: Secondary | ICD-10-CM | POA: Diagnosis not present

## 2018-01-29 DIAGNOSIS — R042 Hemoptysis: Secondary | ICD-10-CM | POA: Diagnosis not present

## 2018-01-29 DIAGNOSIS — T827XXA Infection and inflammatory reaction due to other cardiac and vascular devices, implants and grafts, initial encounter: Secondary | ICD-10-CM | POA: Diagnosis present

## 2018-01-29 DIAGNOSIS — Z885 Allergy status to narcotic agent status: Secondary | ICD-10-CM

## 2018-01-29 DIAGNOSIS — R040 Epistaxis: Secondary | ICD-10-CM | POA: Diagnosis not present

## 2018-01-29 DIAGNOSIS — I252 Old myocardial infarction: Secondary | ICD-10-CM | POA: Diagnosis not present

## 2018-01-29 DIAGNOSIS — E1169 Type 2 diabetes mellitus with other specified complication: Secondary | ICD-10-CM | POA: Diagnosis present

## 2018-01-29 DIAGNOSIS — Z951 Presence of aortocoronary bypass graft: Secondary | ICD-10-CM

## 2018-01-29 DIAGNOSIS — R7881 Bacteremia: Secondary | ICD-10-CM | POA: Diagnosis not present

## 2018-01-29 DIAGNOSIS — E1142 Type 2 diabetes mellitus with diabetic polyneuropathy: Secondary | ICD-10-CM | POA: Diagnosis present

## 2018-01-29 DIAGNOSIS — A4101 Sepsis due to Methicillin susceptible Staphylococcus aureus: Secondary | ICD-10-CM | POA: Diagnosis present

## 2018-01-29 DIAGNOSIS — D7589 Other specified diseases of blood and blood-forming organs: Secondary | ICD-10-CM | POA: Diagnosis present

## 2018-01-29 DIAGNOSIS — M79605 Pain in left leg: Secondary | ICD-10-CM

## 2018-01-29 DIAGNOSIS — M86172 Other acute osteomyelitis, left ankle and foot: Secondary | ICD-10-CM | POA: Diagnosis present

## 2018-01-29 DIAGNOSIS — I5042 Chronic combined systolic (congestive) and diastolic (congestive) heart failure: Secondary | ICD-10-CM | POA: Diagnosis present

## 2018-01-29 DIAGNOSIS — I330 Acute and subacute infective endocarditis: Secondary | ICD-10-CM | POA: Diagnosis not present

## 2018-01-29 DIAGNOSIS — Z794 Long term (current) use of insulin: Secondary | ICD-10-CM

## 2018-01-29 DIAGNOSIS — G8929 Other chronic pain: Secondary | ICD-10-CM | POA: Diagnosis present

## 2018-01-29 DIAGNOSIS — M79604 Pain in right leg: Secondary | ICD-10-CM

## 2018-01-29 DIAGNOSIS — L84 Corns and callosities: Secondary | ICD-10-CM | POA: Diagnosis not present

## 2018-01-29 DIAGNOSIS — Z992 Dependence on renal dialysis: Secondary | ICD-10-CM | POA: Diagnosis not present

## 2018-01-29 DIAGNOSIS — Z515 Encounter for palliative care: Secondary | ICD-10-CM | POA: Diagnosis present

## 2018-01-29 DIAGNOSIS — G9341 Metabolic encephalopathy: Secondary | ICD-10-CM | POA: Diagnosis present

## 2018-01-29 DIAGNOSIS — Z7982 Long term (current) use of aspirin: Secondary | ICD-10-CM

## 2018-01-29 DIAGNOSIS — E1143 Type 2 diabetes mellitus with diabetic autonomic (poly)neuropathy: Secondary | ICD-10-CM | POA: Diagnosis present

## 2018-01-29 DIAGNOSIS — Z888 Allergy status to other drugs, medicaments and biological substances status: Secondary | ICD-10-CM | POA: Diagnosis not present

## 2018-01-29 DIAGNOSIS — R6520 Severe sepsis without septic shock: Secondary | ICD-10-CM | POA: Diagnosis present

## 2018-01-29 DIAGNOSIS — I70222 Atherosclerosis of native arteries of extremities with rest pain, left leg: Secondary | ICD-10-CM | POA: Diagnosis not present

## 2018-01-29 DIAGNOSIS — K219 Gastro-esophageal reflux disease without esophagitis: Secondary | ICD-10-CM | POA: Diagnosis present

## 2018-01-29 DIAGNOSIS — R109 Unspecified abdominal pain: Secondary | ICD-10-CM | POA: Diagnosis not present

## 2018-01-29 DIAGNOSIS — E11621 Type 2 diabetes mellitus with foot ulcer: Secondary | ICD-10-CM | POA: Diagnosis not present

## 2018-01-29 DIAGNOSIS — I739 Peripheral vascular disease, unspecified: Secondary | ICD-10-CM

## 2018-01-29 DIAGNOSIS — Z955 Presence of coronary angioplasty implant and graft: Secondary | ICD-10-CM

## 2018-01-29 DIAGNOSIS — N186 End stage renal disease: Secondary | ICD-10-CM | POA: Diagnosis present

## 2018-01-29 DIAGNOSIS — R319 Hematuria, unspecified: Secondary | ICD-10-CM | POA: Diagnosis not present

## 2018-01-29 DIAGNOSIS — E1152 Type 2 diabetes mellitus with diabetic peripheral angiopathy with gangrene: Secondary | ICD-10-CM | POA: Diagnosis present

## 2018-01-29 DIAGNOSIS — E7800 Pure hypercholesterolemia, unspecified: Secondary | ICD-10-CM | POA: Diagnosis present

## 2018-01-29 DIAGNOSIS — I96 Gangrene, not elsewhere classified: Secondary | ICD-10-CM | POA: Diagnosis not present

## 2018-01-29 DIAGNOSIS — N2581 Secondary hyperparathyroidism of renal origin: Secondary | ICD-10-CM | POA: Diagnosis present

## 2018-01-29 DIAGNOSIS — B9561 Methicillin susceptible Staphylococcus aureus infection as the cause of diseases classified elsewhere: Secondary | ICD-10-CM | POA: Diagnosis not present

## 2018-01-29 DIAGNOSIS — I2510 Atherosclerotic heart disease of native coronary artery without angina pectoris: Secondary | ICD-10-CM | POA: Diagnosis present

## 2018-01-29 DIAGNOSIS — Z7189 Other specified counseling: Secondary | ICD-10-CM

## 2018-01-29 DIAGNOSIS — K3184 Gastroparesis: Secondary | ICD-10-CM | POA: Diagnosis present

## 2018-01-29 DIAGNOSIS — L97529 Non-pressure chronic ulcer of other part of left foot with unspecified severity: Secondary | ICD-10-CM | POA: Diagnosis present

## 2018-01-29 DIAGNOSIS — Z79899 Other long term (current) drug therapy: Secondary | ICD-10-CM

## 2018-01-29 DIAGNOSIS — B958 Unspecified staphylococcus as the cause of diseases classified elsewhere: Secondary | ICD-10-CM | POA: Diagnosis not present

## 2018-01-29 DIAGNOSIS — R011 Cardiac murmur, unspecified: Secondary | ICD-10-CM | POA: Diagnosis not present

## 2018-01-29 DIAGNOSIS — I079 Rheumatic tricuspid valve disease, unspecified: Secondary | ICD-10-CM | POA: Diagnosis present

## 2018-01-29 DIAGNOSIS — R197 Diarrhea, unspecified: Secondary | ICD-10-CM | POA: Diagnosis not present

## 2018-01-29 DIAGNOSIS — I5030 Unspecified diastolic (congestive) heart failure: Secondary | ICD-10-CM | POA: Diagnosis not present

## 2018-01-29 DIAGNOSIS — I7092 Chronic total occlusion of artery of the extremities: Secondary | ICD-10-CM | POA: Diagnosis not present

## 2018-01-29 DIAGNOSIS — A419 Sepsis, unspecified organism: Secondary | ICD-10-CM | POA: Diagnosis present

## 2018-01-29 DIAGNOSIS — I998 Other disorder of circulatory system: Secondary | ICD-10-CM | POA: Diagnosis not present

## 2018-01-29 HISTORY — DX: Disorder of arteries and arterioles, unspecified: I77.9

## 2018-01-29 HISTORY — DX: Peripheral vascular disease, unspecified: I73.9

## 2018-01-29 HISTORY — DX: Unspecified diastolic (congestive) heart failure: I50.30

## 2018-01-29 LAB — APTT: aPTT: 28 seconds (ref 24–36)

## 2018-01-29 LAB — CBC
HCT: 41.8 % (ref 36.0–46.0)
Hemoglobin: 13.2 g/dL (ref 12.0–15.0)
MCH: 30.1 pg (ref 26.0–34.0)
MCHC: 31.6 g/dL (ref 30.0–36.0)
MCV: 95.2 fL (ref 80.0–100.0)
Platelets: 180 10*3/uL (ref 150–400)
RBC: 4.39 MIL/uL (ref 3.87–5.11)
RDW: 18.7 % — ABNORMAL HIGH (ref 11.5–15.5)
WBC: 13.2 10*3/uL — ABNORMAL HIGH (ref 4.0–10.5)
nRBC: 0 % (ref 0.0–0.2)

## 2018-01-29 LAB — GLUCOSE, CAPILLARY: Glucose-Capillary: 219 mg/dL — ABNORMAL HIGH (ref 70–99)

## 2018-01-29 LAB — BASIC METABOLIC PANEL
Anion gap: 18 — ABNORMAL HIGH (ref 5–15)
BUN: 93 mg/dL — ABNORMAL HIGH (ref 8–23)
CO2: 26 mmol/L (ref 22–32)
Calcium: 8.6 mg/dL — ABNORMAL LOW (ref 8.9–10.3)
Chloride: 91 mmol/L — ABNORMAL LOW (ref 98–111)
Creatinine, Ser: 15.41 mg/dL — ABNORMAL HIGH (ref 0.44–1.00)
GFR calc Af Amer: 2 mL/min — ABNORMAL LOW (ref >60)
GFR calc non Af Amer: 2 mL/min — ABNORMAL LOW (ref >60)
Glucose, Bld: 216 mg/dL — ABNORMAL HIGH (ref 70–99)
Potassium: 4.9 mmol/L (ref 3.5–5.1)
Sodium: 135 mmol/L (ref 135–145)

## 2018-01-29 LAB — PROTIME-INR
INR: 1.14
Prothrombin Time: 14.5 seconds (ref 11.4–15.2)

## 2018-01-29 LAB — LACTIC ACID, PLASMA: Lactic Acid, Venous: 2 mmol/L (ref 0.5–1.9)

## 2018-01-29 MED ORDER — INSULIN ASPART 100 UNIT/ML ~~LOC~~ SOLN
0.0000 [IU] | Freq: Three times a day (TID) | SUBCUTANEOUS | Status: DC
  Administered 2018-01-30 – 2018-01-31 (×3): 2 [IU] via SUBCUTANEOUS
  Administered 2018-02-01: 1 [IU] via SUBCUTANEOUS
  Administered 2018-02-01: 2 [IU] via SUBCUTANEOUS
  Administered 2018-02-02 – 2018-02-03 (×2): 3 [IU] via SUBCUTANEOUS
  Administered 2018-02-03: 2 [IU] via SUBCUTANEOUS
  Administered 2018-02-03 – 2018-02-05 (×5): 1 [IU] via SUBCUTANEOUS
  Administered 2018-02-05 – 2018-02-06 (×2): 2 [IU] via SUBCUTANEOUS
  Administered 2018-02-06: 1 [IU] via SUBCUTANEOUS
  Administered 2018-02-07 – 2018-02-08 (×3): 2 [IU] via SUBCUTANEOUS
  Administered 2018-02-08 (×2): 3 [IU] via SUBCUTANEOUS
  Administered 2018-02-09 (×2): 2 [IU] via SUBCUTANEOUS
  Administered 2018-02-10: 3 [IU] via SUBCUTANEOUS
  Administered 2018-02-10 (×2): 1 [IU] via SUBCUTANEOUS
  Administered 2018-02-11 – 2018-02-12 (×2): 2 [IU] via SUBCUTANEOUS
  Administered 2018-02-12: 1 [IU] via SUBCUTANEOUS
  Administered 2018-02-13: 3 [IU] via SUBCUTANEOUS
  Administered 2018-02-13: 2 [IU] via SUBCUTANEOUS
  Administered 2018-02-14: 5 [IU] via SUBCUTANEOUS
  Administered 2018-02-15 (×2): 3 [IU] via SUBCUTANEOUS
  Filled 2018-01-29 (×34): qty 1

## 2018-01-29 MED ORDER — ADULT MULTIVITAMIN W/MINERALS CH: 2.0000 | ORAL_TABLET | Freq: Every day | ORAL | Status: DC

## 2018-01-29 MED ORDER — ACETAMINOPHEN 325 MG PO TABS
650.0000 mg | ORAL_TABLET | Freq: Four times a day (QID) | ORAL | Status: DC | PRN
  Administered 2018-02-04 – 2018-02-08 (×3): 650 mg via ORAL
  Filled 2018-01-29 (×2): qty 2

## 2018-01-29 MED ORDER — CARVEDILOL 6.25 MG PO TABS: 6.2500 mg | ORAL_TABLET | Freq: Two times a day (BID) | ORAL | Status: DC

## 2018-01-29 MED ORDER — VANCOMYCIN HCL IN DEXTROSE 1-5 GM/200ML-% IV SOLN
1000.0000 mg | Freq: Once | INTRAVENOUS | Status: AC
  Administered 2018-01-29: 1000 mg via INTRAVENOUS
  Filled 2018-01-29: qty 200

## 2018-01-29 MED ORDER — ONDANSETRON HCL 4 MG/2ML IJ SOLN
4.0000 mg | Freq: Four times a day (QID) | INTRAMUSCULAR | Status: DC | PRN
  Administered 2018-01-30 – 2018-02-10 (×11): 4 mg via INTRAVENOUS
  Filled 2018-01-29 (×11): qty 2

## 2018-01-29 MED ORDER — ONDANSETRON HCL 4 MG/2ML IJ SOLN
4.0000 mg | Freq: Once | INTRAMUSCULAR | Status: AC
  Administered 2018-01-29: 4 mg via INTRAVENOUS
  Filled 2018-01-29: qty 2

## 2018-01-29 MED ORDER — ACETAMINOPHEN 650 MG RE SUPP: 650.0000 mg | Freq: Four times a day (QID) | RECTAL | Status: DC | PRN

## 2018-01-29 MED ORDER — NITROGLYCERIN 0.4 MG SL SUBL: 0.4000 mg | SUBLINGUAL_TABLET | SUBLINGUAL | Status: DC | PRN

## 2018-01-29 MED ORDER — ATROPINE SULFATE-NACL 0.01-0.9 % OP SOLN: Freq: Two times a day (BID) | OPHTHALMIC | Status: DC

## 2018-01-29 MED ORDER — INSULIN ASPART 100 UNIT/ML ~~LOC~~ SOLN
0.0000 [IU] | Freq: Every day | SUBCUTANEOUS | Status: DC
  Administered 2018-01-31: 2 [IU] via SUBCUTANEOUS
  Administered 2018-02-07: 3 [IU] via SUBCUTANEOUS
  Administered 2018-02-09 – 2018-02-11 (×2): 2 [IU] via SUBCUTANEOUS
  Administered 2018-02-12: 3 [IU] via SUBCUTANEOUS
  Administered 2018-02-14: 2 [IU] via SUBCUTANEOUS
  Filled 2018-01-29 (×6): qty 1

## 2018-01-29 MED ORDER — SEVELAMER CARBONATE 800 MG PO TABS
800.0000 mg | ORAL_TABLET | Freq: Three times a day (TID) | ORAL | Status: DC
  Administered 2018-01-31 – 2018-02-02 (×6): 800 mg via ORAL
  Filled 2018-01-29 (×6): qty 1

## 2018-01-29 MED ORDER — ISOSORBIDE MONONITRATE ER 30 MG PO TB24: 60.0000 mg | ORAL_TABLET | Freq: Every day | ORAL | Status: DC

## 2018-01-29 MED ORDER — ONDANSETRON HCL 4 MG PO TABS: 4.0000 mg | ORAL_TABLET | Freq: Four times a day (QID) | ORAL | Status: DC | PRN

## 2018-01-29 MED ORDER — HYDROCODONE-ACETAMINOPHEN 5-325 MG PO TABS
1.0000 | ORAL_TABLET | ORAL | Status: DC | PRN
  Administered 2018-01-30 (×2): 2 via ORAL
  Filled 2018-01-29 (×2): qty 2

## 2018-01-29 MED ORDER — TRAZODONE HCL 50 MG PO TABS: 25.0000 mg | ORAL_TABLET | Freq: Every evening | ORAL | Status: DC | PRN

## 2018-01-29 MED ORDER — HEPARIN BOLUS VIA INFUSION
4100.0000 [IU] | Freq: Once | INTRAVENOUS | Status: DC
  Filled 2018-01-29: qty 4100

## 2018-01-29 MED ORDER — ACETAMINOPHEN 500 MG PO TABS
1000.0000 mg | ORAL_TABLET | Freq: Once | ORAL | Status: AC
  Administered 2018-01-29: 1000 mg via ORAL
  Filled 2018-01-29: qty 2

## 2018-01-29 MED ORDER — DOCUSATE SODIUM 100 MG PO CAPS
100.0000 mg | ORAL_CAPSULE | Freq: Two times a day (BID) | ORAL | Status: DC
  Administered 2018-01-31 – 2018-02-15 (×30): 100 mg via ORAL
  Filled 2018-01-29 (×30): qty 1

## 2018-01-29 MED ORDER — CHOLECALCIFEROL 10 MCG (400 UNIT) PO TABS
400.0000 [IU] | ORAL_TABLET | ORAL | Status: DC
  Administered 2018-02-02 – 2018-02-14 (×5): 400 [IU] via ORAL
  Filled 2018-01-29 (×8): qty 1

## 2018-01-29 MED ORDER — BISACODYL 5 MG PO TBEC: 5.0000 mg | DELAYED_RELEASE_TABLET | Freq: Every day | ORAL | Status: DC | PRN

## 2018-01-29 MED ORDER — ATROPINE SULFATE-NACL 0.01-0.9 % OP SOLN
1.0000 [drp] | Freq: Two times a day (BID) | OPHTHALMIC | Status: DC
  Administered 2018-02-01: 1 [drp] via OPHTHALMIC

## 2018-01-29 MED ORDER — FAMOTIDINE 20 MG PO TABS
20.0000 mg | ORAL_TABLET | Freq: Every day | ORAL | Status: DC
  Administered 2018-01-31 – 2018-02-15 (×14): 20 mg via ORAL
  Filled 2018-01-29 (×13): qty 1

## 2018-01-29 MED ORDER — METOPROLOL TARTRATE 25 MG PO TABS: 25.0000 mg | ORAL_TABLET | Freq: Two times a day (BID) | ORAL | Status: DC

## 2018-01-29 MED ORDER — HEPARIN (PORCINE) IN NACL 100-0.45 UNIT/ML-% IJ SOLN
1050.0000 [IU]/h | INTRAMUSCULAR | Status: DC
  Filled 2018-01-29: qty 250

## 2018-01-29 MED ORDER — ASPIRIN EC 81 MG PO TBEC
81.0000 mg | DELAYED_RELEASE_TABLET | Freq: Every day | ORAL | Status: DC
  Administered 2018-01-31 – 2018-02-15 (×12): 81 mg via ORAL
  Filled 2018-01-29 (×13): qty 1

## 2018-01-29 MED ORDER — IRBESARTAN 150 MG PO TABS
75.0000 mg | ORAL_TABLET | Freq: Every day | ORAL | Status: DC
  Administered 2018-02-01 – 2018-02-07 (×4): 75 mg via ORAL
  Filled 2018-01-29 (×4): qty 1

## 2018-01-29 MED ORDER — MORPHINE SULFATE (PF) 4 MG/ML IV SOLN
4.0000 mg | Freq: Once | INTRAVENOUS | Status: AC
  Administered 2018-01-29: 4 mg via INTRAVENOUS
  Filled 2018-01-29: qty 1

## 2018-01-29 MED ORDER — PIPERACILLIN-TAZOBACTAM 3.375 G IVPB 30 MIN
3.3750 g | Freq: Once | INTRAVENOUS | Status: AC
  Administered 2018-01-29: 3.375 g via INTRAVENOUS
  Filled 2018-01-29: qty 50

## 2018-01-29 MED ORDER — HEPARIN SODIUM (PORCINE) 5000 UNIT/ML IJ SOLN
5000.0000 [IU] | Freq: Three times a day (TID) | INTRAMUSCULAR | Status: DC
  Administered 2018-01-30 – 2018-01-31 (×2): 5000 [IU] via SUBCUTANEOUS
  Filled 2018-01-29 (×2): qty 1

## 2018-01-29 MED ORDER — LISINOPRIL 10 MG PO TABS: 10.0000 mg | ORAL_TABLET | Freq: Every day | ORAL | Status: DC

## 2018-01-29 MED ORDER — CARVEDILOL 6.25 MG PO TABS: 3.1250 mg | ORAL_TABLET | Freq: Two times a day (BID) | ORAL | Status: DC

## 2018-01-29 MED ORDER — ATROPINE SULFATE 1 % OP SOLN: 1.0000 [drp] | Freq: Two times a day (BID) | OPHTHALMIC | Status: DC

## 2018-01-29 MED ORDER — ATORVASTATIN CALCIUM 20 MG PO TABS
80.0000 mg | ORAL_TABLET | Freq: Every day | ORAL | Status: DC
  Administered 2018-01-31 – 2018-02-14 (×16): 80 mg via ORAL
  Filled 2018-01-29 (×16): qty 4

## 2018-01-29 NOTE — Progress Notes (Signed)
CODE SEPSIS - PHARMACY COMMUNICATION  **Broad Spectrum Antibiotics should be administered within 1 hour of Sepsis diagnosis**  Time Code Sepsis Called/Page Received: 2147  Antibiotics Ordered: vanc/zosyn  Time of 1st antibiotic administration: 2213  Additional action taken by pharmacy:   If necessary, Name of Provider/Nurse Contacted:     Tobie Lords ,PharmD Clinical Pharmacist  01/29/2018  10:53 PM

## 2018-01-29 NOTE — ED Provider Notes (Signed)
[facility] EMERGENCY DEPARTMENT Provider Note   CSN: [id number] Arrival date & time: 01/29/18  1557     History   Chief Complaint Chief Complaint  Patient presents with  . Leg Pain    HPI Stephanie Ellis is a 67 y.o. female presents to the emergency department via EMS for evaluation of bilateral leg pain, she denies any trauma or injury.  Past medical history concerning for end-stage renal disease on daily peritoneal dialysis and type 2 diabetes with hypertension.  Patient states her left leg pain is much more severe than her right.  She describes right leg pain is minimal and chronic.  Her left leg pain has been severe over the last 2 to 3 days.  She states gradually increasing pain that she describes as numbness tingling and burning throughout the anterior and posterior tib-fib region into the ankle and foot.  She has more numbness in the left foot when she does her right.  Both lower extremities have increased pain with ambulation.  She has a history of diabetes and believes she has some peripheral neuropathy.  She denies any fevers, chest pain, shortness of breath.  She typically ambulates with a cane but is having a hard time ambulating.  Pain is severe 10 out of 10 in the left lower leg.  She has not any medications for pain. Patient with bandage to the left great toe, states she has been seeing a foot doctor for 2 to 3 months for ulcer to the left great toe.  HPI  Past Medical History:  Diagnosis Date  . Anemia   . Anorexia   . Bacteremia due to Pseudomonas   . CHF (congestive heart failure) (HCC)    has not happened recently  . Complication of anesthesia 01/2016   WITH LAST STENT 10/17. PASsED OUT AND HAD TO BE AWAKENED  . Coronary artery disease involving left main coronary artery 01/2015   [facility]: 70% LM, p-mLAD 50-60% (Resting FFR 0.75), mRCA 80-90%, ~40 Ost OM & D1  . Dysrhythmia   . ESRD (end stage renal disease) on dialysis [facility])    ESRD secondary  to acute kidney failure s/p CABG  DIALYSIS M/W/F  . Essential hypertension   . GERD (gastroesophageal reflux disease)   . Heart murmur    no treatment  . Hypercholesterolemia   . Myocardial infarction ([place]) 2016  . 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 Marland Kitchen Haithcock: CABG x 3, LIMA to LAD, SVG to RCA, SVG to OM3, EVH  . Sinusitis 2019  . Type II diabetes mellitus with complication [facility])    CAD    Patient Active Problem List   Diagnosis Date Noted  . Encephalopathy 02/06/2017  . Hypertensive emergency 02/06/2017  . Endophthalmitis, left eye 12/05/2016  . Vomiting 10/24/2016  . Hematuria 10/17/2016  . Acute lower UTI 10/17/2016  . Anesthesia complication 63/04/6008  . Chest pain with moderate risk for cardiac etiology 09/04/2016  . Steal syndrome dialysis vascular access, initial encounter ([place]) 06/18/2016  . 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 Coronary artery disease involving coronary bypass graft of native heart with angina pectoris ([place]) 02/10/2016  . Hypertension 02/02/2016  . Hyperlipidemia 02/02/2016  . Coronary artery disease involving left main coronary artery 10/04/2015  . Abdominal pain of unknown etiology 10/04/2015  . ESRD (end stage renal disease) on dialysis ([place])   . Type II diabetes mellitus with complication ([place])   . NSTEMI (non-ST elevated myocardial infarction) ([place])   .  Right sided abdominal pain   . Elevated troponin 10/03/2015  . 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 Chronic diastolic congestive heart failure ([place]) 04/19/2014  . Stage 5 chronic kidney disease on chronic dialysis ([place]) 04/19/2014  . Anemia 09/20/2013  . Routine health maintenance 01/29/2013    Past Surgical History:  Procedure Laterality Date  . ABDOMINAL HYSTERECTOMY    . ARTERIOVENOUS GRAFT PLACEMENT  05/2016  . AV FISTULA PLACEMENT Left 05/30/2016   Procedure: ARTERIOVENOUS graft;  Surgeon: Algernon Huxley, MD;  Location: [facility] ORS;  Service: Vascular;  Laterality:  Left;  . CAPD INSERTION N/A 10/30/2017   Procedure: LAPAROSCOPIC INSERTION CONTINUOUS AMBULATORY PERITONEAL DIALYSIS  (CAPD) CATHETER;  Surgeon: Algernon Huxley, MD;  Location: [facility] ORS;  Service: Vascular;  Laterality: N/A;  . CARDIAC CATHETERIZATION  01/2015   [facility]: Ost LM 70%, p-m LAD 50-60% (Rest FFR + @ 0.75), mRCA 80-90%, ostD1 40%, pOM1 40%  . CATARACT EXTRACTION W/PHACO Left 01/18/2016   Procedure: CATARACT EXTRACTION PHACO AND INTRAOCULAR LENS PLACEMENT (IOC);  Surgeon: Eulogio Bear, MD;  Location: [facility] ORS;  Service: Ophthalmology;  Laterality: Left;  [place] 1.05 AP% 15.5 CDE 10.16 Fluid Pack Lot # [id number]  . CATARACT EXTRACTION W/PHACO Right 08/01/2016   Procedure: CATARACT EXTRACTION PHACO AND INTRAOCULAR LENS PLACEMENT (IOC);  Surgeon: Eulogio Bear, MD;  Location: [facility] ORS;  Service: Ophthalmology;  Laterality: Right;  [place] 01:00.6 AP% 11.4 CDE 6.93  LOT # [id number]  . COLONOSCOPY    . CORONARY ANGIOPLASTY     SENTS 02/12/16  . CORONARY ARTERY BYPASS GRAFT  03/28/15    [facility]: Dr. Waldemar Dickens: LIMA to LAD, SVG to RCA, SVG to OM3, EVH  . ESOPHAGOGASTRODUODENOSCOPY (EGD) WITH PROPOFOL N/A 10/24/2016   Procedure: ESOPHAGOGASTRODUODENOSCOPY (EGD) WITH PROPOFOL;  Surgeon: [name], MD;  Location: [facility] ENDOSCOPY;  Service: Endoscopy;  Laterality: N/A;  . EYE SURGERY Bilateral    cataract surgery  . EYE SURGERY     drains for glaucoma  . INSERTION EXPRESS TUBE SHUNT Right 08/01/2016   Procedure: INSERTION EXPRESS TUBE SHUNT;  Surgeon: Eulogio Bear, MD;  Location: [facility] ORS;  Service: Ophthalmology;  Laterality: Right;  . INSERTION OF AHMED VALVE Left 08/15/2016   Procedure: INSERTION OF AHMED VALVE;  Surgeon: Eulogio Bear, MD;  Location: [facility] ORS;  Service: Ophthalmology;  Laterality: Left;  . INSERTION OF DIALYSIS CATHETER    . TRANSTHORACIC ECHOCARDIOGRAM  January 2017    EF 60-65%. GR 2 DD. Mild degenerative mitral valve disease but no prolapse or regurgitation. Mild left  atrial dilation. Mild to moderate LVH. Pericardial effusion gone  . UPPER EXTREMITY ANGIOGRAPHY Left 06/27/2016   Procedure: Upper Extremity Angiography;  Surgeon: Algernon Huxley, MD;  Location: [place] CV LAB;  Service: Cardiovascular;  Laterality: Left;  . UPPER EXTREMITY ANGIOGRAPHY Left 08/22/2016   Procedure: Upper Extremity Angiography;  Surgeon: Algernon Huxley, MD;  Location: [place] CV LAB;  Service: Cardiovascular;  Laterality: Left;     OB History   None      Home Medications    Prior to Admission medications   Medication Sig Start Date End Date Taking? Authorizing Provider  aspirin EC 81 MG tablet Take 1 tablet (81 mg total) by mouth daily. 10/19/16   Dustin Flock, MD  atorvastatin (LIPITOR) 80 MG tablet Take 80 mg by mouth at bedtime.  05/05/17   [provider]  ATROPINE SULFATE-NACL OP Apply 1 drop to eye 2 (two) times daily. Left eye  only    [provider]  carvedilol (COREG) 3.125 MG tablet Take 3.125 mg by mouth 2 (two) times daily with a meal.  03/06/17 03/01/18  [provider]  CHOLECALCIFEROL PO Take 3 tablets by mouth 3 (three) times a week. Monday, Wednesday and Friday at dialysis    [provider]  gentamicin cream (GARAMYCIN) 0.1 % APPLY TO EXIT SITE DAILY 11/10/17   [provider]  HYDROcodone-acetaminophen (NORCO) 5-325 MG tablet Take 1 tablet by mouth every 6 (six) hours as needed for moderate pain. 10/30/17   Algernon Huxley, MD  insulin glargine (LANTUS) 100 unit/mL SOPN Inject 7 Units into the skin at bedtime.     [provider]  isosorbide mononitrate (IMDUR) 60 MG 24 hr tablet Take 60 mg by mouth daily.    [provider]  lisinopril (PRINIVIL,ZESTRIL) 10 MG tablet Take 10 mg by mouth daily. 11/04/16 11/04/17  [provider]  metoCLOPramide (REGLAN) 10 MG tablet Take 1 tablet (10 mg total) by mouth every 6 (six) hours as needed for nausea, vomiting or refractory nausea / vomiting.  10/25/16   Dustin Flock, MD  metoprolol tartrate (LOPRESSOR) 25 MG tablet Take 25 mg by mouth 2 (two) times daily.     [provider]  Multiple Vitamin (MULTIVITAMIN WITH MINERALS) TABS tablet Take 2 tablets by mouth daily. Gummy vitamins    [provider]  nitroGLYCERIN (NITROSTAT) 0.4 MG SL tablet Place 1 tablet (0.4 mg total) under the tongue every 5 (five) minutes as needed for chest pain. 09/05/16   Loletha Grayer, MD  ondansetron (ZOFRAN) 4 MG tablet Take 4 mg by mouth every 8 (eight) hours as needed for nausea or vomiting.    [provider]  polyvinyl alcohol (LIQUIFILM TEARS) 1.4 % ophthalmic solution Place 1 drop into both eyes 2 (two) times daily.     [provider]  predniSONE (DELTASONE) 10 MG tablet Take 10 mg by mouth daily with breakfast. taper    [provider]  ranitidine (ZANTAC) 300 MG tablet Take 1 tablet (300 mg total) by mouth at bedtime. Patient taking differently: Take 300 mg by mouth daily as needed for heartburn.  09/27/17   Darel Hong, MD  sevelamer carbonate (RENVELA) 800 MG tablet Take 800 mg by mouth 3 (three) times daily with meals.    [provider]  valsartan (DIOVAN) 80 MG tablet Take 80 mg by mouth daily.  07/16/17   [provider]    Family History Family History  Problem Relation Age of Onset  . Diabetes Mellitus II Mother   . Pancreatic cancer Father     Social History Social History   Tobacco Use  . Smoking status: Never Smoker  . Smokeless tobacco: Never Used  Substance Use Topics  . Alcohol use: No  . Drug use: No     Allergies   Chlorthalidone; Fentanyl; Midazolam; Ace inhibitors; Angiotensin receptor blockers; Norvasc [amlodipine]; and Phenergan [promethazine hcl]   Review of Systems Review of Systems  Constitutional: Negative for fever.  Respiratory: Negative for shortness of breath.   Cardiovascular: Negative for chest pain.  Gastrointestinal: Negative for  nausea and vomiting.  Musculoskeletal: Positive for gait problem. Negative for joint swelling.  Skin: Positive for wound. Negative for rash.  Neurological: Positive for numbness.     Physical Exam Updated Vital Signs BP (!) 153/68 (BP Location: Right Arm)   Pulse 92   Temp 98.2 F (36.8 C) (Oral)   Resp 18  Ht 5' 4"  (1.626 m)   Wt 59 kg   SpO2 95%   BMI 22.31 kg/m   Physical Exam  Constitutional: She is oriented to person, place, and time. She appears well-developed and well-nourished.  HENT:  Head: Normocephalic and atraumatic.  Eyes: Conjunctivae are normal.  Neck: Normal range of motion.  Cardiovascular: Normal rate.  Pulmonary/Chest: Effort normal. No respiratory distress.  Musculoskeletal: Normal range of motion.  Examination left lower extremity shows no swelling warmth erythema or edema.  Patient has decreased sensation throughout the left foot slightly decreased more the left than the right.  Left foot slightly cooler on the left than the right.  Patient has intact dorsiflexion plantarflexion bilaterally.  There is a ulcer to the left great toe, 1.5 cm in diameter, wound is dry with no drainage.  Also appears to be deep past subcutaneous tissue.  No exposed bone.  Dorsalis pedis pulses are unable to be palpated bilaterally.  Patient is extremely tender to touch throughout the left tib-fib region on the left side.  Patient is able to straight leg raise.  Patient can internally and externally rotate left hip with no discomfort.  Unable to locate or palpate bilateral dorsalis pedis and posterior tibialis pulses.  Patient with intact femoral pulses laterally.  Neurological: She is alert and oriented to person, place, and time.  Skin: No rash noted.  Psychiatric: She has a normal mood and affect. Her behavior is normal. Thought content normal.     ED Treatments / Results  Labs (all labs ordered are listed, but only abnormal results are displayed) Labs Reviewed  CBC -  Abnormal; Notable for the following components:      Result Value   WBC 13.2 (*)    RDW 18.7 (*)    All other components within normal limits  BASIC METABOLIC PANEL - Abnormal; Notable for the following components:   Chloride 91 (*)    Glucose, Bld 216 (*)    BUN 93 (*)    Creatinine, Ser 15.41 (*)    Calcium 8.6 (*)    GFR calc non Af Amer 2 (*)    GFR calc Af Amer 2 (*)    Anion gap 18 (*)    All other components within normal limits  PROTIME-INR  APTT    EKG None  Radiology Dg Tibia/fibula Left  Result Date: 01/29/2018 CLINICAL DATA:  Presents with ED via ems from home Having pain to both lower legs But states left leg is worse Increased pain with palpation Decreased pulses to foot EXAM: LEFT TIBIA AND FIBULA - 2 VIEW COMPARISON:  None. FINDINGS: No fracture.  No bone lesion. Knee and ankle joints are normally aligned. There are arterial vascular calcifications. Soft tissues are otherwise unremarkable. IMPRESSION: Negative Electronically Signed   By: Lajean Manes M.D.   On: 01/29/2018 20:58   US Venous Img Lower Unilateral Left  Result Date: 01/29/2018 CLINICAL DATA:  Left leg pain EXAM: LEFT LOWER EXTREMITY VENOUS DOPPLER ULTRASOUND TECHNIQUE: Gray-scale sonography with graded compression, as well as color Doppler and duplex ultrasound were performed to evaluate the lower extremity deep venous systems from the level of the common femoral vein and including the common femoral, femoral, profunda femoral, popliteal and calf veins including the posterior tibial, peroneal and gastrocnemius veins when visible. The superficial great saphenous vein was also interrogated. Spectral Doppler was utilized to evaluate flow at rest and with distal augmentation maneuvers in the common femoral, femoral and popliteal veins. COMPARISON:  None.  FINDINGS: Contralateral Common Femoral Vein: Respiratory phasicity is normal and symmetric with the symptomatic side. No evidence of thrombus. Normal  compressibility. Common Femoral Vein: No evidence of thrombus. Normal compressibility, respiratory phasicity and response to augmentation. Saphenofemoral Junction: No evidence of thrombus. Normal compressibility and flow on color Doppler imaging. Profunda Femoral Vein: No evidence of thrombus. Normal compressibility and flow on color Doppler imaging. Femoral Vein: No evidence of thrombus. Normal compressibility, respiratory phasicity and response to augmentation. Popliteal Vein: No evidence of thrombus. Normal compressibility, respiratory phasicity and response to augmentation. Calf Veins: No evidence of thrombus. Normal compressibility and flow on color Doppler imaging. Superficial Great Saphenous Vein: No evidence of thrombus. Normal compressibility. Venous Reflux:  None. Other Findings:  None. IMPRESSION: No evidence of deep venous thrombosis. Electronically Signed   By: Rolm Baptise M.D.   On: 01/29/2018 18:36   Dg Toe Great Left  Result Date: 01/29/2018 CLINICAL DATA:  Open wound to the left great toe for several months. EXAM: LEFT GREAT TOE COMPARISON:  10/07/2017 FINDINGS: Degenerative changes in the first metatarsal-phalangeal joint. Soft tissue defect over the plantar aspect of the left first toe. There is underlying erosion of the distal phalangeal tuft cortex suggesting focal osteomyelitis. No evidence of acute fracture or dislocation. Diffuse bone demineralization. Prominent vascular calcifications. IMPRESSION: Soft tissue defect over the plantar aspect of the left first toe with underlying erosion of the distal phalangeal tuft suggesting osteomyelitis. Electronically Signed   By: Lucienne Capers M.D.   On: 01/29/2018 21:30    Procedures Procedures (including critical care time)  Medications Ordered in ED Medications  heparin bolus via infusion 4,100 Units (has no administration in time range)  heparin ADULT infusion 100 units/mL (25000 units/234m sodium chloride 0.45%) (has no  administration in time range)  morphine 4 MG/ML injection 4 mg (4 mg Intravenous Given 01/29/18 1829)  ondansetron (ZOFRAN) injection 4 mg (4 mg Intravenous Given 01/29/18 1829)     Initial Impression / Assessment and Plan / ED Course  I have reviewed the triage vital signs and the nursing notes.  Pertinent labs & imaging results that were available during my care of the patient were reviewed by me and considered in my medical decision making (see chart for details).     67year old female with history of chronic kidney disease on peritoneal dialysis, hypertension, hyperlipidemia, diabetes presents with left greater than right leg pain for 2 to 3 months with the left leg increasing over the last 2 to 3 days.  She also reports numbness tingling in both lower legs.  She also has a history of a ulcer to the plantar aspect of the left great toe this been present for 2 to 3 months.  Unable to palpate or locate bilateral dorsalis pedis or posterior tibialis pulses.  Normal bilateral femoral pulses.  Left leg tib-fib x-ray shows significant calcification.  No signs of osteonecrosis of left great toe.  Discussed labs with nephrologist who is okay with not dialyzing tonight and okay with continue with dialysis tomorrow.  Patient started on heparin.  Discussed case with vascular who is okay with admission, starting heparin and scheduling patient for angiogram tomorrow.  Final Clinical Impressions(s) / ED Diagnoses   Final diagnoses:  Left leg pain  Right leg pain  Peripheral vascular disease ([place]  Ulcer of great toe, left, with unspecified severity ([facility]    ED Discharge Orders    None       GDuanne Guess PVermont10/10/19 2136  Rudene Re, MD 02/03/18 [phone]

## 2018-01-29 NOTE — ED Notes (Signed)
Vascular called and stated to make pt NPO after midnight for surgery tomorrow.

## 2018-01-29 NOTE — ED Triage Notes (Addendum)
Presents with ED via ems from home  Having pain to both lower legs  But states left leg is worse  Increased pain with palpation  Decreased pulses to foot

## 2018-01-29 NOTE — ED Notes (Signed)
Was able to hear femoral pulse bilat but unable to hear pulses in feet bilat with doppler. [name] aware and in room with pt.

## 2018-01-29 NOTE — Progress Notes (Signed)
ANTICOAGULATION CONSULT NOTE - Initial Consult  Pharmacy Consult for Heparin  Indication: DVT  Allergies  Allergen Reactions  . Chlorthalidone Anaphylaxis, Itching and Rash  . Fentanyl Rash  . Midazolam Rash  . Ace Inhibitors Other (See Comments)    Reaction:  Hyperkalemia, agitation   . Angiotensin Receptor Blockers Other (See Comments)    Reaction:  Hyperkalemia, agitation   . Norvasc [Amlodipine] Itching and Rash  . Phenergan [Promethazine Hcl] Anxiety    "antsy, can't sit still"    Patient Measurements: Height: 5' 4"  (162.6 cm) Weight: 130 lb (59 kg) IBW/kg (Calculated) : 54.7 Heparin Dosing Weight:  59 kg   Vital Signs: Temp: 98.2 F (36.8 C) (10/10 1611) Temp Source: Oral (10/10 1611) BP: 153/68 (10/10 1611) Pulse Rate: 92 (10/10 1611)  Labs: Recent Labs    01/29/18 1722  HGB 13.2  HCT 41.8  PLT 180  CREATININE 15.41*    Estimated Creatinine Clearance: 3.1 mL/min (A) (by C-G formula based on SCr of 15.41 mg/dL (H)).   Medical History: Past Medical History:  Diagnosis Date  . Anemia   . Anorexia   . Bacteremia due to Pseudomonas   . CHF (congestive heart failure) (HCC)    has not happened recently  . Complication of anesthesia 01/2016   WITH LAST STENT 10/17. PASsED OUT AND HAD TO BE AWAKENED  . Coronary artery disease involving left main coronary artery 01/2015   [facility]: 70% LM, p-mLAD 50-60% (Resting FFR 0.75), mRCA 80-90%, ~40 Ost OM & D1  . Dysrhythmia   . ESRD (end stage renal disease) on dialysis [facility])    ESRD secondary to acute kidney failure s/p CABG  DIALYSIS M/W/F  . Essential hypertension   . GERD (gastroesophageal reflux disease)   . Heart murmur    no treatment  . Hypercholesterolemia   . Myocardial infarction ([place]) 2016  . 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 Marland Kitchen Haithcock: CABG x 3, LIMA to LAD, SVG to RCA, SVG to OM3, EVH  . Sinusitis 2019  . Type II diabetes mellitus with complication (HCC)    CAD    Medications:   (Not  in a hospital admission)  Assessment: Pharmacy consulted to dose heparin in this 67 year old female admitted with occluded blood vessel.  No prior anticoag noted. CrCl = 3.1 ml/min  Goal of Therapy:  Heparin level 0.3-0.7 units/ml Monitor platelets by anticoagulation protocol: Yes   Plan:  Give 4100 units bolus x 1 Start heparin infusion at 1050 units/hr Check anti-Xa level in 8 hours and daily while on heparin Continue to monitor H&H and platelets  Gem Conkle D 01/29/2018,9:32 PM

## 2018-01-29 NOTE — ED Provider Notes (Signed)
[facility] Emergency Department Provider Note  ____________________________________________  Time seen: Approximately 9:48 PM  I have reviewed the triage vital signs and the nursing notes.   HISTORY  Chief Complaint Leg Pain  Level 5 caveat:  Portions of the history and physical were unable to be obtained due to confusion   HPI Stephanie Ellis is a 67 y.o. female with a history of ESRD on peritoneal dialysis, CHF, CAD status post CABG, hypertension, diabetes who presents for evaluation of leg pain.  Patient is complaining of left leg pain.  Patient reports right leg pain as well however that is a chronic problem.  Left leg pain is new over the last 2 to 3 days.  The pain is severe and burning located from the knee down.  No fever or chills.  She reports mild numbness in her foot but no weakness.  Patient initially seen at [facility] but moved to [facility] for further care. Arrives to main side with a fever, confused and unable to provide history. History gathered from daughter.   Past Medical History:  Diagnosis Date  . Anemia   . Anorexia   . Bacteremia due to Pseudomonas   . CHF (congestive heart failure) (HCC)    has not happened recently  . Complication of anesthesia 01/2016   WITH LAST STENT 10/17. PASsED OUT AND HAD TO BE AWAKENED  . Coronary artery disease involving left main coronary artery 01/2015   [facility]: 70% LM, p-mLAD 50-60% (Resting FFR 0.75), mRCA 80-90%, ~40 Ost OM & D1  . Dysrhythmia   . ESRD (end stage renal disease) on dialysis [facility])    ESRD secondary to acute kidney failure s/p CABG  DIALYSIS M/W/F  . Essential hypertension   . GERD (gastroesophageal reflux disease)   . Heart murmur    no treatment  . Hypercholesterolemia   . Myocardial infarction ([place]) 2016  . 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 Marland Kitchen [place]: CABG x 3, LIMA to LAD, SVG to RCA, SVG to OM3, EVH  . Sinusitis 2019  . Type II diabetes mellitus with complication [facility])    CAD    Patient Active Problem List   Diagnosis Date Noted  . Sepsis ([place]) 01/29/2018  . Encephalopathy 02/06/2017  . Hypertensive emergency 02/06/2017  . Endophthalmitis, left eye 12/05/2016  . Vomiting 10/24/2016  . Hematuria 10/17/2016  . Acute lower UTI 10/17/2016  . Anesthesia complication 09/40/7680  . Chest pain with moderate risk for cardiac etiology 09/04/2016  . Steal syndrome dialysis vascular access, initial encounter ([place]) 06/18/2016  . 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 Coronary artery disease involving coronary bypass graft of native heart with angina pectoris ([place]) 02/10/2016  . Hypertension 02/02/2016  . Hyperlipidemia 02/02/2016  . Coronary artery disease involving left main coronary artery 10/04/2015  . Abdominal pain of unknown etiology 10/04/2015  . ESRD (end stage renal disease) on dialysis ([place])   . Type II diabetes mellitus with complication ([place])   . NSTEMI (non-ST elevated myocardial infarction) ([place])   . Right sided abdominal pain   . Elevated troponin 10/03/2015  . 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 Chronic diastolic congestive heart failure ([place]) 04/19/2014  . Stage 5 chronic kidney disease on chronic dialysis ([place]) 04/19/2014  . Anemia 09/20/2013  . Routine health maintenance 01/29/2013    Past Surgical History:  Procedure Laterality Date  . ABDOMINAL HYSTERECTOMY    . ARTERIOVENOUS GRAFT PLACEMENT  05/2016  . AV FISTULA PLACEMENT  Left 05/30/2016   Procedure: ARTERIOVENOUS graft;  Surgeon: Algernon Huxley, MD;  Location: [facility] ORS;  Service: Vascular;  Laterality: Left;  . CAPD INSERTION N/A 10/30/2017   Procedure: LAPAROSCOPIC INSERTION CONTINUOUS AMBULATORY PERITONEAL DIALYSIS  (CAPD) CATHETER;  Surgeon: Algernon Huxley, MD;  Location: [facility] ORS;  Service: Vascular;  Laterality: N/A;  . CARDIAC CATHETERIZATION  01/2015   [facility]: Ost LM 70%, p-m LAD 50-60% (Rest FFR + @ 0.75), mRCA 80-90%, ostD1 40%, pOM1 40%  . CATARACT EXTRACTION W/PHACO  Left 01/18/2016   Procedure: CATARACT EXTRACTION PHACO AND INTRAOCULAR LENS PLACEMENT (IOC);  Surgeon: Eulogio Bear, MD;  Location: [facility] ORS;  Service: Ophthalmology;  Laterality: Left;  [place] 1.05 AP% 15.5 CDE 10.16 Fluid Pack Lot # [id number]  . CATARACT EXTRACTION W/PHACO Right 08/01/2016   Procedure: CATARACT EXTRACTION PHACO AND INTRAOCULAR LENS PLACEMENT (IOC);  Surgeon: Eulogio Bear, MD;  Location: [facility] ORS;  Service: Ophthalmology;  Laterality: Right;  [place] 01:00.6 AP% 11.4 CDE 6.93  LOT # [id number]  . COLONOSCOPY    . CORONARY ANGIOPLASTY     SENTS 02/12/16  . CORONARY ARTERY BYPASS GRAFT  03/28/15    [facility]: Dr. Waldemar Dickens: LIMA to LAD, SVG to RCA, SVG to OM3, EVH  . ESOPHAGOGASTRODUODENOSCOPY (EGD) WITH PROPOFOL N/A 10/24/2016   Procedure: ESOPHAGOGASTRODUODENOSCOPY (EGD) WITH PROPOFOL;  Surgeon: [name], MD;  Location: [facility] ENDOSCOPY;  Service: Endoscopy;  Laterality: N/A;  . EYE SURGERY Bilateral    cataract surgery  . EYE SURGERY     drains for glaucoma  . INSERTION EXPRESS TUBE SHUNT Right 08/01/2016   Procedure: INSERTION EXPRESS TUBE SHUNT;  Surgeon: Eulogio Bear, MD;  Location: [facility] ORS;  Service: Ophthalmology;  Laterality: Right;  . INSERTION OF AHMED VALVE Left 08/15/2016   Procedure: INSERTION OF AHMED VALVE;  Surgeon: Eulogio Bear, MD;  Location: [facility] ORS;  Service: Ophthalmology;  Laterality: Left;  . INSERTION OF DIALYSIS CATHETER    . TRANSTHORACIC ECHOCARDIOGRAM  January 2017    EF 60-65%. GR 2 DD. Mild degenerative mitral valve disease but no prolapse or regurgitation. Mild left atrial dilation. Mild to moderate LVH. Pericardial effusion gone  . UPPER EXTREMITY ANGIOGRAPHY Left 06/27/2016   Procedure: Upper Extremity Angiography;  Surgeon: Algernon Huxley, MD;  Location: [facility];  Service: Cardiovascular;  Laterality: Left;  . UPPER EXTREMITY ANGIOGRAPHY Left 08/22/2016   Procedure: Upper Extremity Angiography;  Surgeon: Algernon Huxley, MD;   Location: [facility];  Service: Cardiovascular;  Laterality: Left;    Prior to Admission medications   Medication Sig Start Date End Date Taking? Authorizing Provider  aspirin EC 81 MG tablet Take 1 tablet (81 mg total) by mouth daily. 10/19/16  Yes Dustin Flock, MD  atorvastatin (LIPITOR) 80 MG tablet Take 80 mg by mouth at bedtime.  05/05/17  Yes [provider]  calcitRIOL (ROCALTROL) 0.5 MCG capsule Take 0.5 mcg by mouth daily.   Yes [provider]  carvedilol (COREG) 3.125 MG tablet Take 3.125 mg by mouth 2 (two) times daily with a meal.  03/06/17 03/01/18 Yes [provider]  Cholecalciferol 1000 units tablet Take 1 tablet by mouth daily. Monday, Wednesday and Friday at dialysis   Yes [provider]  insulin glargine (LANTUS) 100 unit/mL SOPN Inject 14 Units into the skin at bedtime.    Yes [provider]  isosorbide mononitrate (IMDUR) 30 MG 24 hr tablet Take 30 mg by mouth daily.    Yes  [provider]  mirtazapine (REMERON) 7.5 MG tablet Take 7.5 mg by mouth at bedtime.   Yes [provider]  Multiple Vitamins-Minerals (PRORENAL + D) TABS Take 1 tablet by mouth daily.   Yes [provider]  nitroGLYCERIN (NITROSTAT) 0.4 MG SL tablet Place 1 tablet (0.4 mg total) under the tongue every 5 (five) minutes as needed for chest pain. 09/05/16  Yes Wieting, Richard, MD  Omega-3 300 MG CAPS Take 2 capsules by mouth daily.   Yes [provider]  valsartan (DIOVAN) 80 MG tablet Take 80 mg by mouth 2 (two) times daily.  07/16/17  Yes [provider]  ATROPINE SULFATE-NACL OP Apply 1 drop to eye 2 (two) times daily. Left eye only    [provider]  gentamicin cream (GARAMYCIN) 0.1 % APPLY TO EXIT SITE DAILY 11/10/17   [provider]  HYDROcodone-acetaminophen (NORCO) 5-325 MG tablet Take 1 tablet by mouth every 6 (six) hours as needed for moderate pain. Patient not taking: Reported  on 01/29/2018 10/30/17   Algernon Huxley, MD  lisinopril (PRINIVIL,ZESTRIL) 10 MG tablet Take 10 mg by mouth daily. 11/04/16 11/04/17  [provider]  metoCLOPramide (REGLAN) 10 MG tablet Take 1 tablet (10 mg total) by mouth every 6 (six) hours as needed for nausea, vomiting or refractory nausea / vomiting. Patient not taking: Reported on 01/29/2018 10/25/16   Dustin Flock, MD  metoprolol tartrate (LOPRESSOR) 25 MG tablet Take 25 mg by mouth 2 (two) times daily.     [provider]  Multiple Vitamin (MULTIVITAMIN WITH MINERALS) TABS tablet Take 2 tablets by mouth daily. Gummy vitamins    [provider]  ondansetron (ZOFRAN) 4 MG tablet Take 4 mg by mouth every 8 (eight) hours as needed for nausea or vomiting.    [provider]  polyvinyl alcohol (LIQUIFILM TEARS) 1.4 % ophthalmic solution Place 1 drop into both eyes 2 (two) times daily.     [provider]  predniSONE (DELTASONE) 10 MG tablet Take 10 mg by mouth daily with breakfast. taper    [provider]  ranitidine (ZANTAC) 300 MG tablet Take 1 tablet (300 mg total) by mouth at bedtime. Patient not taking: Reported on 01/29/2018 09/27/17   Darel Hong, MD  sevelamer carbonate (RENVELA) 800 MG tablet Take 800 mg by mouth 3 (three) times daily with meals.    [provider]    Allergies Chlorthalidone; Fentanyl; Midazolam; Ace inhibitors; Angiotensin receptor blockers; Norvasc [amlodipine]; and Phenergan [promethazine hcl]  Family History  Problem Relation Age of Onset  . Diabetes Mellitus II Mother   . Pancreatic cancer Father     Social History Social History   Tobacco Use  . Smoking status: Never Smoker  . Smokeless tobacco: Never Used  Substance Use Topics  . Alcohol use: No  . Drug use: No    Review of Systems  Constitutional: Negative for fever. Eyes: Negative for visual changes. ENT: Negative for sore throat. Neck: No neck pain  Cardiovascular: Negative  for chest pain. Respiratory: Negative for shortness of breath. Gastrointestinal: Negative for abdominal pain, vomiting or diarrhea. Genitourinary: Negative for dysuria. Musculoskeletal: Negative for back pain. + LLE pain Skin: Negative for rash. Neurological: Negative for headaches, weakness or numbness. Psych: No SI or HI  ____________________________________________   PHYSICAL EXAM:  VITAL SIGNS: ED Triage Vitals  Enc Vitals Group     BP 01/29/18 1611 (!) 153/68     Pulse Rate 01/29/18 1611 92  Resp 01/29/18 1611 18     Temp 01/29/18 1611 98.2 F (36.8 C)     Temp Source 01/29/18 1611 Oral     SpO2 01/29/18 1611 95 %     Weight 01/29/18 1605 130 lb (59 kg)     Height 01/29/18 1605 5' 4"  (1.626 m)     Head Circumference --      Peak Flow --      Pain Score 01/29/18 1605 10     Pain Loc --      Pain Edu? --      Excl. in [place]? --     Constitutional: Awake, not answering questions, cries when left foot is touched HEENT:      Head: Normocephalic and atraumatic.         Eyes: Conjunctivae are normal. Sclera is non-icteric.       Mouth/Throat: Mucous membranes are moist.       Neck: Supple with no signs of meningismus. Cardiovascular: Regular rate and rhythm. No murmurs, gallops, or rubs. 2+ symmetrical distal pulses are present in all extremities. No JVD. Respiratory: Normal respiratory effort. Lungs are clear to auscultation bilaterally. No wheezes, crackles, or rhonchi.  Gastrointestinal: Soft, non tender, and non distended with positive bowel sounds. No rebound or guarding. Musculoskeletal: Necrotic big toe on the L with open ulceration on the sole, dry, no purulent discharge, bilateral feet are cool to touch with normal cap refill, legs are warm to the touch. PT pulses doplerable bilateral, absent DP bilaterally, palpable femoral and popliteal pulses on exam, no crepitus. Neurologic: Normal speech and language. Face is symmetric. Moving all extremities. No gross focal  neurologic deficits are appreciated. Skin: Skin is warm, dry and intact. No rash noted.  ____________________________________________   LABS (all labs ordered are listed, but only abnormal results are displayed)  Labs Reviewed  CBC - Abnormal; Notable for the following components:      Result Value   WBC 13.2 (*)    RDW 18.7 (*)    All other components within normal limits  BASIC METABOLIC PANEL - Abnormal; Notable for the following components:   Chloride 91 (*)    Glucose, Bld 216 (*)    BUN 93 (*)    Creatinine, Ser 15.41 (*)    Calcium 8.6 (*)    GFR calc non Af Amer 2 (*)    GFR calc Af Amer 2 (*)    Anion gap 18 (*)    All other components within normal limits  LACTIC ACID, PLASMA - Abnormal; Notable for the following components:   Lactic Acid, Venous 2.0 (*)    All other components within normal limits  GLUCOSE, CAPILLARY - Abnormal; Notable for the following components:   Glucose-Capillary 219 (*)    All other components within normal limits  MRSA PCR SCREENING  CULTURE, BLOOD (ROUTINE X 2)  CULTURE, BLOOD (ROUTINE X 2)  PROTIME-INR  APTT  URINALYSIS, ROUTINE W REFLEX MICROSCOPIC  HIV ANTIBODY (ROUTINE TESTING W REFLEX)  BASIC METABOLIC PANEL  CBC  LACTIC ACID, PLASMA   ____________________________________________  EKG  ED ECG REPORT I, Rudene Re, the attending physician, personally viewed and interpreted this ECG.  Sinus tachycardia, rate of 103, normal intervals, left axis deviation, no ST elevations or depressions, T wave inversions in lateral leads with peak T waves.  Findings are new when compared to prior. ____________________________________________  RADIOLOGY  I have personally reviewed the images performed during this visit and I agree with the Radiologist's read.  Interpretation by Radiologist:  Dg Tibia/fibula Left  Result Date: 01/29/2018 CLINICAL DATA:  Presents with ED via ems from home Having pain to both lower legs But states  left leg is worse Increased pain with palpation Decreased pulses to foot EXAM: LEFT TIBIA AND FIBULA - 2 VIEW COMPARISON:  None. FINDINGS: No fracture.  No bone lesion. Knee and ankle joints are normally aligned. There are arterial vascular calcifications. Soft tissues are otherwise unremarkable. IMPRESSION: Negative Electronically Signed   By: Lajean Manes M.D.   On: 01/29/2018 20:58   US Venous Img Lower Unilateral Left  Result Date: 01/29/2018 CLINICAL DATA:  Left leg pain EXAM: LEFT LOWER EXTREMITY VENOUS DOPPLER ULTRASOUND TECHNIQUE: Gray-scale sonography with graded compression, as well as color Doppler and duplex ultrasound were performed to evaluate the lower extremity deep venous systems from the level of the common femoral vein and including the common femoral, femoral, profunda femoral, popliteal and calf veins including the posterior tibial, peroneal and gastrocnemius veins when visible. The superficial great saphenous vein was also interrogated. Spectral Doppler was utilized to evaluate flow at rest and with distal augmentation maneuvers in the common femoral, femoral and popliteal veins. COMPARISON:  None. FINDINGS: Contralateral Common Femoral Vein: Respiratory phasicity is normal and symmetric with the symptomatic side. No evidence of thrombus. Normal compressibility. Common Femoral Vein: No evidence of thrombus. Normal compressibility, respiratory phasicity and response to augmentation. Saphenofemoral Junction: No evidence of thrombus. Normal compressibility and flow on color Doppler imaging. Profunda Femoral Vein: No evidence of thrombus. Normal compressibility and flow on color Doppler imaging. Femoral Vein: No evidence of thrombus. Normal compressibility, respiratory phasicity and response to augmentation. Popliteal Vein: No evidence of thrombus. Normal compressibility, respiratory phasicity and response to augmentation. Calf Veins: No evidence of thrombus. Normal compressibility and flow  on color Doppler imaging. Superficial Great Saphenous Vein: No evidence of thrombus. Normal compressibility. Venous Reflux:  None. Other Findings:  None. IMPRESSION: No evidence of deep venous thrombosis. Electronically Signed   By: Rolm Baptise M.D.   On: 01/29/2018 18:36   Dg Toe Great Left  Result Date: 01/29/2018 CLINICAL DATA:  Open wound to the left great toe for several months. EXAM: LEFT GREAT TOE COMPARISON:  10/07/2017 FINDINGS: Degenerative changes in the first metatarsal-phalangeal joint. Soft tissue defect over the plantar aspect of the left first toe. There is underlying erosion of the distal phalangeal tuft cortex suggesting focal osteomyelitis. No evidence of acute fracture or dislocation. Diffuse bone demineralization. Prominent vascular calcifications. IMPRESSION: Soft tissue defect over the plantar aspect of the left first toe with underlying erosion of the distal phalangeal tuft suggesting osteomyelitis. Electronically Signed   By: Lucienne Capers M.D.   On: 01/29/2018 21:30    ____________________________________________   PROCEDURES  Procedure(s) performed: None Procedures Critical Care performed: yes  CRITICAL CARE Performed by: Rudene Re  ?  Total critical care time: 35 min  Critical care time was exclusive of separately billable procedures and treating other patients.  Critical care was necessary to treat or prevent imminent or life-threatening deterioration.  Critical care was time spent personally by me on the following activities: development of treatment plan with patient and/or surrogate as well as nursing, discussions with consultants, evaluation of patient's response to treatment, examination of patient, obtaining history from patient or surrogate, ordering and performing treatments and interventions, ordering and review of laboratory studies, ordering and review of radiographic studies, pulse oximetry and re-evaluation of patient's  condition.  ____________________________________________   INITIAL IMPRESSION /  ASSESSMENT AND PLAN / ED COURSE  67 y.o. female with a history of ESRD on peritoneal dialysis, CHF, CAD status post CABG, hypertension, diabetes who presents for evaluation of leg pain.  Patient with a necrotic left great toe with open ulceration, fever, and elevated white count concerning for sepsis.  Sepsis protocol was initiated.  Patient does have cool bilateral feet but has dopplerable PT pulses bilaterally and palpable popliteal and femoral pulses.  She has normal and brisk capillary few.  At this time do not believe patient's pain is due to ischemic limb as bilateral lower extremities are identical.  X-rays concerning for osteomyelitis of the toe.  Will start patient on broad-spectrum antibiotics.  Discussed with Dr. Doyle Askew for admission.      As part of my medical decision making, I reviewed the following data within the [place] notes reviewed and incorporated, Labs reviewed , EKG interpreted , Old EKG reviewed, Old chart reviewed, Radiograph reviewed , Discussed with admitting physician , Notes from prior ED visits and [place] Controlled Substance Database    Pertinent labs & imaging results that were available during my care of the patient were reviewed by me and considered in my medical decision making (see chart for details).    ____________________________________________   FINAL CLINICAL IMPRESSION(S) / ED DIAGNOSES  Final diagnoses:  Left leg pain  Right leg pain  Peripheral vascular disease (HCC)  Ulcer of great toe, left, with unspecified severity ([place])  Sepsis, due to unspecified organism, unspecified whether acute organ dysfunction present ([place])  Diabetic osteomyelitis ([place])      NEW MEDICATIONS STARTED DURING THIS VISIT:  ED Discharge Orders    None       Note:  This document was prepared using Dragon voice recognition software and may include  unintentional dictation errors.    Rudene Re, MD 01/30/18 [id number]

## 2018-01-29 NOTE — H&P (Signed)
[facility] [place] NAME: Stephanie Ellis    MR#:  [id number]  DATE OF BIRTH:  1950-12-13  DATE OF ADMISSION:  01/29/2018  PRIMARY CARE PHYSICIAN: System, Pcp Not In   REQUESTING/REFERRING PHYSICIAN:   CHIEF COMPLAINT:   Chief Complaint  Patient presents with  . Leg Pain    HISTORY OF PRESENT ILLNESS: Stephanie Ellis  is a 67 y.o. female with a known history of end-stage renal disease on peritoneal dialysis, peripheral neuropathy, CHF, hypertension, type 2 diabetes and other comorbidities. History is obtained from reviewing the medical records and from discussion with emergency room physician and the family.  Patient is unable to provide history due to confusion and lethargy. Patient was brought to emergency room for severe left leg pain, going on for the past 2 to 3 days, gradually getting worse.  Although lethargic, patient is very sensitive and complains of severe pain with palpation of the left lower extremity below the knee.  Patient has a left great toe ulcer for which she has been following with wound care clinic.  Per family, patient has chronic pain in both lower extremities secondary to peripheral neuropathy, but patient has been complaining of new, severe burning pain to the left lower extremity in the past 2 to 3 days.  No reports of cough, chest pain, abdominal pain, vomiting, diarrhea, bleeding. Blood test done emergency room reveal elevated lactic acid level at 2 and elevated WBC at 13.2. While in the emergency room, patient's temperature is noted to be elevated, at 102. Left foot x-ray shows soft tissue defect over the plantar aspect of the left first toe with underlying erosion of the distal phalangeal tuft suggesting osteomyelitis. Patient is admitted for further evaluation and treatment.  PAST MEDICAL HISTORY:   Past Medical History:  Diagnosis Date  . Anemia   . Anorexia   . Bacteremia due to Pseudomonas   . CHF  (congestive heart failure) (HCC)    has not happened recently  . Complication of anesthesia 01/2016   WITH LAST STENT 10/17. PASsED OUT AND HAD TO BE AWAKENED  . Coronary artery disease involving left main coronary artery 01/2015   [facility]: 70% LM, p-mLAD 50-60% (Resting FFR 0.75), mRCA 80-90%, ~40 Ost OM & D1  . Dysrhythmia   . ESRD (end stage renal disease) on dialysis [facility])    ESRD secondary to acute kidney failure s/p CABG  DIALYSIS M/W/F  . Essential hypertension   . GERD (gastroesophageal reflux disease)   . Heart murmur    no treatment  . Hypercholesterolemia   . Myocardial infarction ([place]) 2016  . 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 Marland Kitchen Haithcock: CABG x 3, LIMA to LAD, SVG to RCA, SVG to OM3, EVH  . Sinusitis 2019  . Type II diabetes mellitus with complication (HCC)    CAD    PAST SURGICAL HISTORY:  Past Surgical History:  Procedure Laterality Date  . ABDOMINAL HYSTERECTOMY    . ARTERIOVENOUS GRAFT PLACEMENT  05/2016  . AV FISTULA PLACEMENT Left 05/30/2016   Procedure: ARTERIOVENOUS graft;  Surgeon: Algernon Huxley, MD;  Location: [facility] ORS;  Service: Vascular;  Laterality: Left;  . CAPD INSERTION N/A 10/30/2017   Procedure: LAPAROSCOPIC INSERTION CONTINUOUS AMBULATORY PERITONEAL DIALYSIS  (CAPD) CATHETER;  Surgeon: Algernon Huxley, MD;  Location: [facility] ORS;  Service: Vascular;  Laterality: N/A;  . CARDIAC CATHETERIZATION  01/2015   [facility]: Ost LM 70%, p-m LAD 50-60% (  Rest FFR + @ 0.75), mRCA 80-90%, ostD1 40%, pOM1 40%  . CATARACT EXTRACTION W/PHACO Left 01/18/2016   Procedure: CATARACT EXTRACTION PHACO AND INTRAOCULAR LENS PLACEMENT (IOC);  Surgeon: Eulogio Bear, MD;  Location: [facility] ORS;  Service: Ophthalmology;  Laterality: Left;  [place] 1.05 AP% 15.5 CDE 10.16 Fluid Pack Lot # [id number]  . CATARACT EXTRACTION W/PHACO Right 08/01/2016   Procedure: CATARACT EXTRACTION PHACO AND INTRAOCULAR LENS PLACEMENT (IOC);  Surgeon: Eulogio Bear, MD;  Location: [facility] ORS;  Service:  Ophthalmology;  Laterality: Right;  [place] 01:00.6 AP% 11.4 CDE 6.93  LOT # [id number]  . COLONOSCOPY    . CORONARY ANGIOPLASTY     SENTS 02/12/16  . CORONARY ARTERY BYPASS GRAFT  03/28/15    [facility]: Dr. Waldemar Dickens: LIMA to LAD, SVG to RCA, SVG to OM3, EVH  . ESOPHAGOGASTRODUODENOSCOPY (EGD) WITH PROPOFOL N/A 10/24/2016   Procedure: ESOPHAGOGASTRODUODENOSCOPY (EGD) WITH PROPOFOL;  Surgeon: [name], MD;  Location: [facility] ENDOSCOPY;  Service: Endoscopy;  Laterality: N/A;  . EYE SURGERY Bilateral    cataract surgery  . EYE SURGERY     drains for glaucoma  . INSERTION EXPRESS TUBE SHUNT Right 08/01/2016   Procedure: INSERTION EXPRESS TUBE SHUNT;  Surgeon: Eulogio Bear, MD;  Location: [facility] ORS;  Service: Ophthalmology;  Laterality: Right;  . INSERTION OF AHMED VALVE Left 08/15/2016   Procedure: INSERTION OF AHMED VALVE;  Surgeon: Eulogio Bear, MD;  Location: [facility] ORS;  Service: Ophthalmology;  Laterality: Left;  . INSERTION OF DIALYSIS CATHETER    . TRANSTHORACIC ECHOCARDIOGRAM  January 2017    EF 60-65%. GR 2 DD. Mild degenerative mitral valve disease but no prolapse or regurgitation. Mild left atrial dilation. Mild to moderate LVH. Pericardial effusion gone  . UPPER EXTREMITY ANGIOGRAPHY Left 06/27/2016   Procedure: Upper Extremity Angiography;  Surgeon: Algernon Huxley, MD;  Location: [place] CV LAB;  Service: Cardiovascular;  Laterality: Left;  . UPPER EXTREMITY ANGIOGRAPHY Left 08/22/2016   Procedure: Upper Extremity Angiography;  Surgeon: Algernon Huxley, MD;  Location: [employer] CV LAB;  Service: Cardiovascular;  Laterality: Left;    SOCIAL HISTORY:  Social History   Tobacco Use  . Smoking status: Never Smoker  . Smokeless tobacco: Never Used  Substance Use Topics  . Alcohol use: No    FAMILY HISTORY:  Family History  Problem Relation Age of Onset  . Diabetes Mellitus II Mother   . Pancreatic cancer Father     DRUG ALLERGIES:  Allergies  Allergen Reactions  .  Chlorthalidone Anaphylaxis, Itching and Rash  . Fentanyl Rash  . Midazolam Rash  . Ace Inhibitors Other (See Comments)    Reaction:  Hyperkalemia, agitation   . Angiotensin Receptor Blockers Other (See Comments)    Reaction:  Hyperkalemia, agitation   . Norvasc [Amlodipine] Itching and Rash  . Phenergan [Promethazine Hcl] Anxiety    "antsy, can't sit still"    REVIEW OF SYSTEMS:   Unable to obtain, secondary to patient's altered mental status.  MEDICATIONS AT HOME:  Prior to Admission medications   Medication Sig Start Date End Date Taking? Authorizing Provider  aspirin EC 81 MG tablet Take 1 tablet (81 mg total) by mouth daily. 10/19/16   Dustin Flock, MD  atorvastatin (LIPITOR) 80 MG tablet Take 80 mg by mouth at bedtime.  05/05/17   [provider]  ATROPINE SULFATE-NACL OP Apply 1 drop to eye 2 (two) times daily. Left eye only    [provider]  carvedilol (  COREG) 3.125 MG tablet Take 3.125 mg by mouth 2 (two) times daily with a meal.  03/06/17 03/01/18  [provider]  CHOLECALCIFEROL PO Take 3 tablets by mouth 3 (three) times a week. Monday, Wednesday and Friday at dialysis    [provider]  gentamicin cream (GARAMYCIN) 0.1 % APPLY TO EXIT SITE DAILY 11/10/17   [provider]  HYDROcodone-acetaminophen (NORCO) 5-325 MG tablet Take 1 tablet by mouth every 6 (six) hours as needed for moderate pain. 10/30/17   Algernon Huxley, MD  insulin glargine (LANTUS) 100 unit/mL SOPN Inject 7 Units into the skin at bedtime.     [provider]  isosorbide mononitrate (IMDUR) 60 MG 24 hr tablet Take 60 mg by mouth daily.    [provider]  lisinopril (PRINIVIL,ZESTRIL) 10 MG tablet Take 10 mg by mouth daily. 11/04/16 11/04/17  [provider]  metoCLOPramide (REGLAN) 10 MG tablet Take 1 tablet (10 mg total) by mouth every 6 (six) hours as needed for nausea, vomiting or refractory nausea / vomiting. 10/25/16   Dustin Flock,  MD  metoprolol tartrate (LOPRESSOR) 25 MG tablet Take 25 mg by mouth 2 (two) times daily.     [provider]  Multiple Vitamin (MULTIVITAMIN WITH MINERALS) TABS tablet Take 2 tablets by mouth daily. Gummy vitamins    [provider]  nitroGLYCERIN (NITROSTAT) 0.4 MG SL tablet Place 1 tablet (0.4 mg total) under the tongue every 5 (five) minutes as needed for chest pain. 09/05/16   Loletha Grayer, MD  ondansetron (ZOFRAN) 4 MG tablet Take 4 mg by mouth every 8 (eight) hours as needed for nausea or vomiting.    [provider]  polyvinyl alcohol (LIQUIFILM TEARS) 1.4 % ophthalmic solution Place 1 drop into both eyes 2 (two) times daily.     [provider]  predniSONE (DELTASONE) 10 MG tablet Take 10 mg by mouth daily with breakfast. taper    [provider]  ranitidine (ZANTAC) 300 MG tablet Take 1 tablet (300 mg total) by mouth at bedtime. Patient taking differently: Take 300 mg by mouth daily as needed for heartburn.  09/27/17   Darel Hong, MD  sevelamer carbonate (RENVELA) 800 MG tablet Take 800 mg by mouth 3 (three) times daily with meals.    [provider]  valsartan (DIOVAN) 80 MG tablet Take 80 mg by mouth daily.  07/16/17   [provider]      PHYSICAL EXAMINATION:   VITAL SIGNS: Blood pressure (!) 160/83, pulse (!) 112, temperature (!) 102 F (38.9 C), temperature source Oral, resp. rate 18, height 5' 4"  (1.626 m), weight 59 kg, SpO2 98 %.  GENERAL:  66 y.o.-year-old patient lying in the bed.  She looks lethargic and acutely ill. EYES: Pupils equal, round, reactive to light and accommodation. No scleral icterus.  HEENT: Head atraumatic, normocephalic. Oropharynx and nasopharynx clear.  NECK:  Supple, no jugular venous distention. No thyroid enlargement, no tenderness.  LUNGS: Reduced breath sounds bilaterally, no wheezing, rales,rhonchi or crepitation. No use of accessory muscles of respiration.  CARDIOVASCULAR: S1,  S2 normal. No S3/S4.  ABDOMEN: Soft, nontender, nondistended. Bowel sounds present. No organomegaly or mass.  EXTREMITIES: Left great toe was noted with open ulcer.  DP pulses are absent bilaterally with palpation. NEUROLOGIC EXAM: Is limited due to patient's altered mental status.  Patient is moving all her extremities, no focal weakness appreciated. PSYCHIATRIC: The patient is lethargic, confused.  SKIN: Left big toe is noted with  open ulceration and necrosis, but without any purulent or bloody discharge.    LABORATORY PANEL:   CBC Recent Labs  Lab 01/29/18 1722  WBC 13.2*  HGB 13.2  HCT 41.8  PLT 180  MCV 95.2  MCH 30.1  MCHC 31.6  RDW 18.7*   ------------------------------------------------------------------------------------------------------------------  Chemistries  Recent Labs  Lab 01/29/18 1722  NA 135  K 4.9  CL 91*  CO2 26  GLUCOSE 216*  BUN 93*  CREATININE 15.41*  CALCIUM 8.6*   ------------------------------------------------------------------------------------------------------------------ estimated creatinine clearance is 3.1 mL/min (A) (by C-G formula based on SCr of 15.41 mg/dL (H)). ------------------------------------------------------------------------------------------------------------------ No results for input(s): TSH, T4TOTAL, T3FREE, THYROIDAB in the last 72 hours.  Invalid input(s): FREET3   Coagulation profile No results for input(s): INR, PROTIME in the last 168 hours. ------------------------------------------------------------------------------------------------------------------- No results for input(s): DDIMER in the last 72 hours. -------------------------------------------------------------------------------------------------------------------  Cardiac Enzymes No results for input(s): CKMB, TROPONINI, MYOGLOBIN in the last 168 hours.  Invalid input(s):  CK ------------------------------------------------------------------------------------------------------------------ Invalid input(s): POCBNP  ---------------------------------------------------------------------------------------------------------------  Urinalysis    Component Value Date/Time   COLORURINE RED (A) 10/17/2016 1643   APPEARANCEUR TURBID (A) 10/17/2016 1643   LABSPEC 1.014 10/17/2016 1643   PHURINE  10/17/2016 1643    TEST NOT REPORTED DUE TO COLOR INTERFERENCE OF URINE PIGMENT   GLUCOSEU (A) 10/17/2016 1643    TEST NOT REPORTED DUE TO COLOR INTERFERENCE OF URINE PIGMENT   HGBUR (A) 10/17/2016 1643    TEST NOT REPORTED DUE TO COLOR INTERFERENCE OF URINE PIGMENT   BILIRUBINUR (A) 10/17/2016 1643    TEST NOT REPORTED DUE TO COLOR INTERFERENCE OF URINE PIGMENT   KETONESUR (A) 10/17/2016 1643    TEST NOT REPORTED DUE TO COLOR INTERFERENCE OF URINE PIGMENT   PROTEINUR (A) 10/17/2016 1643    TEST NOT REPORTED DUE TO COLOR INTERFERENCE OF URINE PIGMENT   NITRITE (A) 10/17/2016 1643    TEST NOT REPORTED DUE TO COLOR INTERFERENCE OF URINE PIGMENT   LEUKOCYTESUR (A) 10/17/2016 1643    TEST NOT REPORTED DUE TO COLOR INTERFERENCE OF URINE PIGMENT     RADIOLOGY: Dg Tibia/fibula Left  Result Date: 01/29/2018 CLINICAL DATA:  Presents with ED via ems from home Having pain to both lower legs But states left leg is worse Increased pain with palpation Decreased pulses to foot EXAM: LEFT TIBIA AND FIBULA - 2 VIEW COMPARISON:  None. FINDINGS: No fracture.  No bone lesion. Knee and ankle joints are normally aligned. There are arterial vascular calcifications. Soft tissues are otherwise unremarkable. IMPRESSION: Negative Electronically Signed   By: Lajean Manes M.D.   On: 01/29/2018 20:58   US Venous Img Lower Unilateral Left  Result Date: 01/29/2018 CLINICAL DATA:  Left leg pain EXAM: LEFT LOWER EXTREMITY VENOUS DOPPLER ULTRASOUND TECHNIQUE: Gray-scale sonography with graded  compression, as well as color Doppler and duplex ultrasound were performed to evaluate the lower extremity deep venous systems from the level of the common femoral vein and including the common femoral, femoral, profunda femoral, popliteal and calf veins including the posterior tibial, peroneal and gastrocnemius veins when visible. The superficial great saphenous vein was also interrogated. Spectral Doppler was utilized to evaluate flow at rest and with distal augmentation maneuvers in the common femoral, femoral and popliteal veins. COMPARISON:  None. FINDINGS: Contralateral Common Femoral Vein: Respiratory phasicity is normal and symmetric with the symptomatic side. No evidence of thrombus. Normal compressibility. Common Femoral Vein: No evidence of thrombus. Normal compressibility, respiratory phasicity and response to augmentation. Saphenofemoral Junction:  No evidence of thrombus. Normal compressibility and flow on color Doppler imaging. Profunda Femoral Vein: No evidence of thrombus. Normal compressibility and flow on color Doppler imaging. Femoral Vein: No evidence of thrombus. Normal compressibility, respiratory phasicity and response to augmentation. Popliteal Vein: No evidence of thrombus. Normal compressibility, respiratory phasicity and response to augmentation. Calf Veins: No evidence of thrombus. Normal compressibility and flow on color Doppler imaging. Superficial Great Saphenous Vein: No evidence of thrombus. Normal compressibility. Venous Reflux:  None. Other Findings:  None. IMPRESSION: No evidence of deep venous thrombosis. Electronically Signed   By: Rolm Baptise M.D.   On: 01/29/2018 18:36   Dg Toe Great Left  Result Date: 01/29/2018 CLINICAL DATA:  Open wound to the left great toe for several months. EXAM: LEFT GREAT TOE COMPARISON:  10/07/2017 FINDINGS: Degenerative changes in the first metatarsal-phalangeal joint. Soft tissue defect over the plantar aspect of the left first toe. There is  underlying erosion of the distal phalangeal tuft cortex suggesting focal osteomyelitis. No evidence of acute fracture or dislocation. Diffuse bone demineralization. Prominent vascular calcifications. IMPRESSION: Soft tissue defect over the plantar aspect of the left first toe with underlying erosion of the distal phalangeal tuft suggesting osteomyelitis. Electronically Signed   By: Lucienne Capers M.D.   On: 01/29/2018 21:30    EKG: Orders placed or performed during the hospital encounter of 01/29/18  . ED EKG 12-Lead  . ED EKG 12-Lead    IMPRESSION AND PLAN:  1.  Sepsis, possibly secondary to left great toe osteomyelitis.  We will start treatment with broad-spectrum IV antibiotics. We will continue to monitor patient clinically closely and follow lactic acid level blood cultures results. 2.  Left great toe osteomyelitis.  We will start patient on broad-spectrum IV antibiotics and consult podiatry for further evaluation and treatment.  Continue supportive measures with pain medication and Tylenol for fever. 3.  PAD.  Vascular surgery is consulted for further evaluation and treatment. 4.  End-stage renal disease on peritoneal dialysis.  Continue management per nephrology. 5.  Diabetes type 2.  Continue to monitor blood sugars before meals and at bedtime and use insulin treatment during the hospital stay. 6. CHF, currently clinically compensated, continue maintenance therapy. 7.  Peripheral neuropathy, continue pain medication. 8.  Acute encephalopathy, likely secondary to sepsis.  Continue to monitor clinically closely, while treating the underlying disease.  All the records are reviewed and case discussed with ED provider. Management plans discussed with the patient, family and they are in agreement.  CODE STATUS: Full Code Status History    Date Active Date Inactive Code Status Order ID Comments User Context   10/24/2016 1055 10/25/2016 2243 Full Code [id number]  Hillary Bow, MD ED    10/17/2016 2143 10/19/2016 1440 Full Code [id number]  Vaughan Basta, MD Inpatient   09/04/2016 1708 09/05/2016 1500 Full Code [id number]  Epifanio Lesches, MD ED   08/22/2016 0938 08/22/2016 1833 Full Code [id number]  Algernon Huxley, MD Inpatient   06/27/2016 1121 06/27/2016 1846 Full Code [id number]  Algernon Huxley, MD Inpatient   10/03/2015 2235 10/04/2015 1724 Full Code [id number]  Harrie Foreman, MD Inpatient       TOTAL TIME TAKING CARE OF THIS PATIENT: 55 minutes.    Amelia Jo M.D on 01/29/2018 at 9:52 PM  Between 7am to 6pm - Pager - [phone]  After 6pm go to [email or web address] - password [email or web address] [facility] [place] [facility]  [phone]  CC: Primary care  physician; System, Pcp Not In

## 2018-01-30 ENCOUNTER — Other Ambulatory Visit: Payer: Self-pay

## 2018-01-30 ENCOUNTER — Inpatient Hospital Stay: Payer: Medicare Other | Admitting: Anesthesiology

## 2018-01-30 ENCOUNTER — Encounter
Admission: EM | Disposition: A | Discharge status: Skilled Nursing Facility | Payer: Self-pay | Source: Home / Self Care | Attending: Internal Medicine

## 2018-01-30 ENCOUNTER — Encounter: Payer: Self-pay | Admitting: Anesthesiology

## 2018-01-30 DIAGNOSIS — E1169 Type 2 diabetes mellitus with other specified complication: Secondary | ICD-10-CM | POA: Insufficient documentation

## 2018-01-30 DIAGNOSIS — E785 Hyperlipidemia, unspecified: Secondary | ICD-10-CM | POA: Insufficient documentation

## 2018-01-30 DIAGNOSIS — E119 Type 2 diabetes mellitus without complications: Secondary | ICD-10-CM | POA: Insufficient documentation

## 2018-01-30 DIAGNOSIS — I7092 Chronic total occlusion of artery of the extremities: Secondary | ICD-10-CM

## 2018-01-30 DIAGNOSIS — A4101 Sepsis due to Methicillin susceptible Staphylococcus aureus: Secondary | ICD-10-CM

## 2018-01-30 DIAGNOSIS — I70222 Atherosclerosis of native arteries of extremities with rest pain, left leg: Secondary | ICD-10-CM

## 2018-01-30 HISTORY — PX: LOWER EXTREMITY ANGIOGRAPHY: CATH118251

## 2018-01-30 LAB — LACTIC ACID, PLASMA: Lactic Acid, Venous: 1.6 mmol/L (ref 0.5–1.9)

## 2018-01-30 LAB — BASIC METABOLIC PANEL
Anion gap: 17 — ABNORMAL HIGH (ref 5–15)
Anion gap: 14 (ref 5–15)
BUN: 97 mg/dL — ABNORMAL HIGH (ref 8–23)
BUN: 35 mg/dL — ABNORMAL HIGH (ref 8–23)
CO2: 25 mmol/L (ref 22–32)
CO2: 27 mmol/L (ref 22–32)
Calcium: 8.2 mg/dL — ABNORMAL LOW (ref 8.9–10.3)
Calcium: 8.2 mg/dL — ABNORMAL LOW (ref 8.9–10.3)
Chloride: 93 mmol/L — ABNORMAL LOW (ref 98–111)
Chloride: 98 mmol/L (ref 98–111)
Creatinine, Ser: 16.71 mg/dL — ABNORMAL HIGH (ref 0.44–1.00)
Creatinine, Ser: 6.72 mg/dL — ABNORMAL HIGH (ref 0.44–1.00)
GFR calc Af Amer: 2 mL/min — ABNORMAL LOW (ref >60)
GFR calc Af Amer: 7 mL/min — ABNORMAL LOW (ref >60)
GFR calc non Af Amer: 2 mL/min — ABNORMAL LOW (ref >60)
GFR calc non Af Amer: 6 mL/min — ABNORMAL LOW (ref >60)
Glucose, Bld: 195 mg/dL — ABNORMAL HIGH (ref 70–99)
Glucose, Bld: 122 mg/dL — ABNORMAL HIGH (ref 70–99)
Potassium: 6.1 mmol/L — ABNORMAL HIGH (ref 3.5–5.1)
Potassium: 3.2 mmol/L — ABNORMAL LOW (ref 3.5–5.1)
Sodium: 135 mmol/L (ref 135–145)
Sodium: 139 mmol/L (ref 135–145)

## 2018-01-30 LAB — BLOOD CULTURE ID PANEL (REFLEXED)
Acinetobacter baumannii: NOT DETECTED
Candida albicans: NOT DETECTED
Candida glabrata: NOT DETECTED
Candida krusei: NOT DETECTED
Candida parapsilosis: NOT DETECTED
Candida tropicalis: NOT DETECTED
Carbapenem resistance: NOT DETECTED
Enterobacter cloacae complex: NOT DETECTED
Enterobacteriaceae species: NOT DETECTED
Enterococcus species: NOT DETECTED
Escherichia coli: NOT DETECTED
Haemophilus influenzae: NOT DETECTED
Klebsiella oxytoca: NOT DETECTED
Klebsiella pneumoniae: NOT DETECTED
Listeria monocytogenes: NOT DETECTED
Methicillin resistance: NOT DETECTED
Neisseria meningitidis: NOT DETECTED
Proteus species: NOT DETECTED
Pseudomonas aeruginosa: NOT DETECTED
Serratia marcescens: NOT DETECTED
Staphylococcus aureus (BCID): DETECTED — AB
Staphylococcus species: DETECTED — AB
Streptococcus agalactiae: NOT DETECTED
Streptococcus pneumoniae: NOT DETECTED
Streptococcus pyogenes: NOT DETECTED
Streptococcus species: NOT DETECTED
Vancomycin resistance: NOT DETECTED

## 2018-01-30 LAB — GLUCOSE, CAPILLARY
Glucose-Capillary: 196 mg/dL — ABNORMAL HIGH (ref 70–99)
Glucose-Capillary: 159 mg/dL — ABNORMAL HIGH (ref 70–99)
Glucose-Capillary: 93 mg/dL (ref 70–99)

## 2018-01-30 LAB — CBC
HCT: 39 % (ref 36.0–46.0)
Hemoglobin: 11.9 g/dL — ABNORMAL LOW (ref 12.0–15.0)
MCH: 29.8 pg (ref 26.0–34.0)
MCHC: 30.5 g/dL (ref 30.0–36.0)
MCV: 97.5 fL (ref 80.0–100.0)
Platelets: 156 10*3/uL (ref 150–400)
RBC: 4 MIL/uL (ref 3.87–5.11)
RDW: 18.7 % — ABNORMAL HIGH (ref 11.5–15.5)
WBC: 16.7 10*3/uL — ABNORMAL HIGH (ref 4.0–10.5)
nRBC: 0 % (ref 0.0–0.2)

## 2018-01-30 LAB — MRSA PCR SCREENING: MRSA by PCR: NEGATIVE

## 2018-01-30 SURGERY — LOWER EXTREMITY ANGIOGRAPHY
Anesthesia: General | Laterality: Left

## 2018-01-30 MED ORDER — CHLORHEXIDINE GLUCONATE CLOTH 2 % EX PADS
6.0000 | MEDICATED_PAD | Freq: Every day | CUTANEOUS | Status: DC
  Administered 2018-01-30 – 2018-02-15 (×14): 6 via TOPICAL

## 2018-01-30 MED ORDER — GLYCOPYRROLATE 0.2 MG/ML IJ SOLN
INTRAMUSCULAR | Status: DC | PRN
  Administered 2018-01-30: 0.2 mg via INTRAVENOUS

## 2018-01-30 MED ORDER — HEPARIN (PORCINE) IN NACL 1000-0.9 UT/500ML-% IV SOLN
INTRAVENOUS | Status: AC
  Filled 2018-01-30: qty 1000

## 2018-01-30 MED ORDER — HEPARIN SODIUM (PORCINE) 1000 UNIT/ML IJ SOLN
INTRAMUSCULAR | Status: DC | PRN
  Administered 2018-01-30: 5000 [IU] via INTRAVENOUS

## 2018-01-30 MED ORDER — PHENYLEPHRINE HCL 10 MG/ML IJ SOLN
INTRAMUSCULAR | Status: DC | PRN
  Administered 2018-01-30 (×4): 100 ug via INTRAVENOUS

## 2018-01-30 MED ORDER — SODIUM CHLORIDE 0.9 % IJ SOLN
INTRAMUSCULAR | Status: AC
  Filled 2018-01-30: qty 50

## 2018-01-30 MED ORDER — PROPOFOL 10 MG/ML IV BOLUS
INTRAVENOUS | Status: DC | PRN
  Administered 2018-01-30: 80 mg via INTRAVENOUS

## 2018-01-30 MED ORDER — ONDANSETRON HCL 4 MG/2ML IJ SOLN
INTRAMUSCULAR | Status: DC | PRN
  Administered 2018-01-30: 4 mg via INTRAVENOUS

## 2018-01-30 MED ORDER — RENA-VITE PO TABS
1.0000 | ORAL_TABLET | Freq: Every day | ORAL | Status: DC
  Administered 2018-01-31 – 2018-02-14 (×14): 1 via ORAL
  Filled 2018-01-30 (×15): qty 1

## 2018-01-30 MED ORDER — NITROGLYCERIN 5 MG/ML IV SOLN
INTRAVENOUS | Status: AC
  Filled 2018-01-30: qty 10

## 2018-01-30 MED ORDER — KETAMINE HCL 50 MG/ML IJ SOLN
INTRAMUSCULAR | Status: DC | PRN
  Administered 2018-01-30: 50 mg via INTRAMUSCULAR
  Administered 2018-01-30: 25 mg via INTRAMUSCULAR

## 2018-01-30 MED ORDER — OCUVITE-LUTEIN PO CAPS
1.0000 | ORAL_CAPSULE | Freq: Every day | ORAL | Status: DC
  Administered 2018-02-01 – 2018-02-15 (×12): 1 via ORAL
  Filled 2018-01-30 (×16): qty 1

## 2018-01-30 MED ORDER — HEPARIN SODIUM (PORCINE) 1000 UNIT/ML IJ SOLN
INTRAMUSCULAR | Status: AC
  Filled 2018-01-30: qty 1

## 2018-01-30 MED ORDER — ONDANSETRON HCL 4 MG/2ML IJ SOLN: 4.0000 mg | Freq: Once | INTRAMUSCULAR | Status: DC | PRN

## 2018-01-30 MED ORDER — MIDODRINE HCL 5 MG PO TABS
5.0000 mg | ORAL_TABLET | Freq: Once | ORAL | Status: AC
  Administered 2018-01-30: 5 mg via ORAL
  Filled 2018-01-30: qty 1

## 2018-01-30 MED ORDER — IOPAMIDOL (ISOVUE-300) INJECTION 61%
INTRAVENOUS | Status: DC | PRN
  Administered 2018-01-30: 65 mL via INTRAVENOUS

## 2018-01-30 MED ORDER — NEPRO/CARBSTEADY PO LIQD
237.0000 mL | Freq: Two times a day (BID) | ORAL | Status: DC
  Administered 2018-01-31 – 2018-02-08 (×6): 237 mL via ORAL

## 2018-01-30 MED ORDER — PIPERACILLIN-TAZOBACTAM 3.375 G IVPB: 3.3750 g | Freq: Two times a day (BID) | INTRAVENOUS | Status: DC

## 2018-01-30 MED ORDER — PROPOFOL 10 MG/ML IV BOLUS
INTRAVENOUS | Status: AC
  Filled 2018-01-30: qty 20

## 2018-01-30 MED ORDER — ADULT MULTIVITAMIN W/MINERALS CH
1.0000 | ORAL_TABLET | Freq: Every day | ORAL | Status: DC
  Administered 2018-01-31 – 2018-02-15 (×14): 1 via ORAL
  Filled 2018-01-30 (×13): qty 1

## 2018-01-30 MED ORDER — KETAMINE HCL 50 MG/ML IJ SOLN
INTRAMUSCULAR | Status: AC
  Filled 2018-01-30: qty 10

## 2018-01-30 MED ORDER — HEPARIN (PORCINE) IN NACL 100-0.45 UNIT/ML-% IJ SOLN
650.0000 [IU]/h | INTRAMUSCULAR | Status: DC
  Administered 2018-01-30: 800 [IU]/h via INTRAVENOUS
  Administered 2018-02-02: 850 [IU]/h via INTRAVENOUS
  Administered 2018-02-03: 650 [IU]/h via INTRAVENOUS
  Filled 2018-01-30 (×4): qty 250

## 2018-01-30 MED ORDER — LIDOCAINE HCL (PF) 1 % IJ SOLN
INTRAMUSCULAR | Status: AC
  Filled 2018-01-30: qty 30

## 2018-01-30 MED ORDER — VANCOMYCIN HCL IN DEXTROSE 1-5 GM/200ML-% IV SOLN
1000.0000 mg | INTRAVENOUS | Status: DC
  Filled 2018-01-30: qty 200

## 2018-01-30 MED ORDER — ALTEPLASE 2 MG IJ SOLR
INTRAMUSCULAR | Status: AC
  Filled 2018-01-30: qty 8

## 2018-01-30 MED ORDER — SODIUM CHLORIDE 0.9 % IV SOLN
INTRAVENOUS | Status: DC
  Administered 2018-01-30 – 2018-02-02 (×2): via INTRAVENOUS

## 2018-01-30 MED ORDER — DEXTROSE 5 % IV SOLN
500.0000 mg | Freq: Two times a day (BID) | INTRAVENOUS | Status: DC
  Administered 2018-01-30 – 2018-01-31 (×3): 500 mg via INTRAVENOUS
  Filled 2018-01-30 (×4): qty 5

## 2018-01-30 SURGICAL SUPPLY — 22 items
BALLN LUTONIX 018 5X60X130 (BALLOONS) ×2
BALLN LUTONIX DCB 4X60X130 (BALLOONS) ×2
BALLOON LUTONIX 018 5X60X130 (BALLOONS) ×1 IMPLANT
BALLOON LUTONIX DCB 4X60X130 (BALLOONS) ×1 IMPLANT
CANISTER PENUMBRA ENGINE (MISCELLANEOUS) ×2 IMPLANT
CATH INDIGO CAT6 KIT (CATHETERS) ×2 IMPLANT
CATH PIG 70CM (CATHETERS) ×2 IMPLANT
CATH SEEKER .035X150CM (CATHETERS) ×2 IMPLANT
DEVICE PRESTO INFLATION (MISCELLANEOUS) ×2 IMPLANT
DEVICE SAFEGUARD 24CM (GAUZE/BANDAGES/DRESSINGS) ×2 IMPLANT
DEVICE STARCLOSE SE CLOSURE (Vascular Products) ×2 IMPLANT
DEVICE TORQUE .025-.038 (MISCELLANEOUS) ×2 IMPLANT
NEEDLE ENTRY 21GA 7CM ECHOTIP (NEEDLE) ×2 IMPLANT
PACK ANGIOGRAPHY (CUSTOM PROCEDURE TRAY) ×2 IMPLANT
SET INTRO CAPELLA COAXIAL (SET/KITS/TRAYS/PACK) ×4 IMPLANT
SHEATH BRITE TIP 5FRX11 (SHEATH) ×2 IMPLANT
SHEATH GUIDING CAROTID 6FRX90 (SHEATH) ×2 IMPLANT
SYR MEDRAD MARK V 150ML (SYRINGE) ×2 IMPLANT
TUBING CONTRAST HIGH PRESS 72 (TUBING) ×2 IMPLANT
WIRE AQUATRACK .035X260CM (WIRE) ×4 IMPLANT
WIRE J 3MM .035X145CM (WIRE) ×2 IMPLANT
WIRE SPARTACORE .014X300CM (WIRE) ×2 IMPLANT

## 2018-01-30 NOTE — Progress Notes (Signed)
Patient with O2 sat 71-88% on 3Lpm via , although her hands/fingers are very cold; attempts to warm them up work poorly; Respiratory therapy, Don, conferred with, to attempt to get accurate O2 sat. reading; forehead probe attempted without success; O2 sat. 91-96% with O2 @3Lnc , with probe placed on right ear; will continue to monitor. Barbaraann Faster, RN 6:31 AM; 01/30/2018

## 2018-01-30 NOTE — Progress Notes (Signed)
Initial Nutrition Assessment  DOCUMENTATION CODES:   Not applicable  INTERVENTION:   Nepro Shake po BID, each supplement provides 425 kcal and 19 grams protein  MVI daily  Rena-vite daily   Ocuvite daily for wound healing (provides zinc, vitamin A, vitamin C, Vitamin E, copper, and selenium)  NUTRITION DIAGNOSIS:   Increased nutrient needs related to chronic illness, wound healing(ESRD on PD) as evidenced by increased estimated needs.  GOAL:   Patient will meet greater than or equal to 90% of their needs  MONITOR:   PO intake, Supplement acceptance, Labs, Weight trends, I & O's, Skin  REASON FOR ASSESSMENT:   Malnutrition Screening Tool    ASSESSMENT:   67 y.o. black female with end stage renal disease on peritoneal dialysis, hypertension, congestive heart failure, coronary artery disease status post CABG, diabetes mellitus type II insulin dependent, GERD, peripheral vascular disease admitted to [facility] on 01/29/2018 for toe osteomyelitis and sepsis    Met with pt in room today. Pt reports poor appetite and oral intake at baseline. Pt does not drink any supplements at home. Pt reports that she is hungry today; pt is NPO for procedure. Per chart, pt is weight stable pta. RD discussed with pt the importance of adequate nutrient intake needed to replace losses from dialysis as well as to promote wound healing. Pt would like to try butter pecan Nepro. RD will add Ocuvite for wound healing.   Medications reviewed and include: aspirin, vitamin D, colace, pepcid, heparin, MVI, renvela, cefazolin, zofran  Labs reviewed: K 3.2(L), BUN 35(H), creat 6.72(H) Wbc- 16.7(H) iPTH- 103(H)- 09/2016 cbgs- 196, 159 x 24 hrs  NUTRITION - FOCUSED PHYSICAL EXAM:    Most Recent Value  Orbital Region  No depletion  Upper Arm Region  Moderate depletion  Thoracic and Lumbar Region  Mild depletion  Buccal Region  Mild depletion  Temple Region  Mild depletion  Clavicle Bone Region  Mild  depletion  Clavicle and Acromion Bone Region  Mild depletion  Scapular Bone Region  Mild depletion  Dorsal Hand  Mild depletion  Patellar Region  Mild depletion  Anterior Thigh Region  Mild depletion  Posterior Calf Region  Moderate depletion  Edema (RD Assessment)  None  Hair  Reviewed  Eyes  Reviewed  Mouth  Reviewed  Skin  Reviewed  Nails  Reviewed     Diet Order:   Diet Order            Diet NPO time specified  Diet effective midnight             EDUCATION NEEDS:   Education needs have been addressed  Skin:  Skin Assessment: Reviewed RN Assessment(L toe ulcer )  Last BM:  10/10  Height:   Ht Readings from Last 1 Encounters:  01/30/18 5' 4" (1.626 m)    Weight:   Wt Readings from Last 1 Encounters:  01/30/18 55.4 kg    Ideal Body Weight:  54.5 kg  BMI:  Body mass index is 20.96 kg/m.  Estimated Nutritional Needs:   Kcal:  1400-1600kcal/day   Protein:  71-83g/day   Fluid:  UOP + 1L    MS, RD, LDN Pager #- [phone] Office#- [phone] After Hours Pager: [phone]  

## 2018-01-30 NOTE — Transfer of Care (Signed)
Immediate Anesthesia Transfer of Care Note  Patient: Katlen Seyer  Procedure(s) Performed: Lower Extremity Angiography (Left )  Patient Location: PACU  Anesthesia Type:General  Level of Consciousness: awake, alert  and oriented  Airway & Oxygen Therapy: Patient Spontanous Breathing and Patient connected to face mask oxygen  Post-op Assessment: Report given to RN and Post -op Vital signs reviewed and stable  Post vital signs: Reviewed and stable  Last Vitals:  Vitals Value Taken Time  BP 136/63 01/30/2018  6:27 PM  Temp    Pulse 91 01/30/2018  6:27 PM  Resp 17 01/30/2018  6:27 PM  SpO2 100 % 01/30/2018  6:27 PM  Vitals shown include unvalidated device data.  Last Pain:  Vitals:   01/30/18 1528  TempSrc: Oral  PainSc: 10-Worst pain ever      Patients Stated Pain Goal: 0 (01/09/79 2217)  Complications: No apparent anesthesia complications

## 2018-01-30 NOTE — Anesthesia Postprocedure Evaluation (Signed)
Anesthesia Post Note  Patient: Stephanie Ellis  Procedure(s) Performed: Lower Extremity Angiography (Left )  Patient location during evaluation: Cath Lab Anesthesia Type: General Level of consciousness: awake and alert Pain management: pain level controlled Vital Signs Assessment: post-procedure vital signs reviewed and stable Respiratory status: spontaneous breathing, nonlabored ventilation, respiratory function stable and patient connected to nasal cannula oxygen Cardiovascular status: blood pressure returned to baseline and stable Postop Assessment: no apparent nausea or vomiting Anesthetic complications: no     Last Vitals:  Vitals:   01/30/18 1927 01/30/18 1952  BP:  133/60  Pulse:  92  Resp:  (!) 22  Temp: 37.1 C 37.3 C  SpO2:  99%    Last Pain:  Vitals:   01/30/18 1906  TempSrc:   PainSc: 0-No pain                 Jalyssa Fleisher S

## 2018-01-30 NOTE — Consult Note (Signed)
CHAMP autoconsult for Staph aureus bacteremia.  Patient with ESRD on HD and with 1 blood culture with MSSA.  On appropriate treatment with cefazolin.  Has AV graft.   Should do TTE, TEE to rule out infective endocarditis. Repeat blood cultures.    With graft would treat for 4 weeks from negative blood culture, longer if IE confirmed.   Evaluation for other source including grafts, back pain.   Available by phone for questions if needed.  Thayer Headings, MD

## 2018-01-30 NOTE — Progress Notes (Signed)
PT Cancellation Note  Patient Details Name: Shaasia Odle MRN: [id number] DOB: [birth year]   Cancelled Treatment:    Reason Eval/Treat Not Completed: Patient at procedure or test/unavailable(Consult received and chart reviewed.  Patient currently off unit in dialysis; will re-attempt next date as patient medically appropriate and available.)   Agnes Brightbill H. Owens Shark, PT, DPT, NCS 01/30/18, 12:36 PM [phone]

## 2018-01-30 NOTE — Progress Notes (Signed)
[place] at [facility]                                                                                                                                                                                  Patient Demographics   Stephanie Ellis, is a 67 y.o. female, DOB - 1950/10/06, MMN:[id number]  Admit date - 01/29/2018   Admitting Physician Amelia Jo, MD  Outpatient Primary MD for the patient is System, Pcp Not In   LOS - 1  Subjective: Recent admitted with osteomyelitis of the foot mental status now improved. He has chronic foot and her legs   Review of Systems:   CONSTITUTIONAL: No documented fever. No fatigue, weakness. No weight gain, no weight loss.  EYES: No blurry or double vision.  ENT: No tinnitus. No postnasal drip. No redness of the oropharynx.  RESPIRATORY: No cough, no wheeze, no hemoptysis. No dyspnea.  CARDIOVASCULAR: No chest pain. No orthopnea. No palpitations. No syncope.  GASTROINTESTINAL: No nausea, no vomiting or diarrhea. No abdominal pain. No melena or hematochezia.  GENITOURINARY: No dysuria or hematuria.  ENDOCRINE: No polyuria or nocturia. No heat or cold intolerance.  HEMATOLOGY: No anemia. No bruising. No bleeding.  INTEGUMENTARY: No rashes. No lesions.  MUSCULOSKELETAL: No arthritis. No swelling. No gout.  Left foot osteomyelitis NEUROLOGIC: No numbness, tingling, or ataxia. No seizure-type activity.  PSYCHIATRIC: No anxiety. No insomnia. No ADD.    Vitals:   Vitals:   01/30/18 1330 01/30/18 1345 01/30/18 1409 01/30/18 1433  BP: (!) 115/57 105/83  108/81  Pulse: 89 86 89 90  Resp: 11 16 10 13   Temp:  98.7 F (37.1 C)  98.7 F (37.1 C)  TempSrc:  Oral  Oral  SpO2:    100%  Weight:      Height:        Wt Readings from Last 3 Encounters:  01/30/18 55.4 kg  10/15/17 58.1 kg  10/09/17 58.1 kg     Intake/Output Summary (Last 24 hours) at 01/30/2018 1500 Last data filed at 01/30/2018 1345 Gross per 24 hour   Intake 110 ml  Output -303 ml  Net 413 ml    Physical Exam:   GENERAL: Pleasant-appearing in no apparent distress.  HEAD, EYES, EARS, NOSE AND THROAT: Atraumatic, normocephalic. Extraocular muscles are intact. Pupils equal and reactive to light. Sclerae anicteric. No conjunctival injection. No oro-pharyngeal erythema.  NECK: Supple. There is no jugular venous distention. No bruits, no lymphadenopathy, no thyromegaly.  HEART: Regular rate and rhythm,. No murmurs, no rubs, no clicks.  LUNGS: Clear to auscultation bilaterally. No rales or rhonchi. No wheezes.  ABDOMEN: Soft, flat, nontender, nondistended. Has good bowel  sounds. No hepatosplenomegaly appreciated.  EXTREMITIES: Left great toe with ulcer NEUROLOGIC: The patient is alert, awake, and oriented x3 with no focal motor or sensory deficits appreciated bilaterally.  SKIN: Moist and warm with no rashes appreciated.  Psych: Not anxious, depressed LN: No inguinal LN enlargement    Antibiotics   Anti-infectives (From admission, onward)   Start     Dose/Rate Route Frequency Ordered Stop   01/30/18 1800  vancomycin (VANCOCIN) IVPB 1000 mg/200 mL premix  Status:  Discontinued     1,000 mg 200 mL/hr over 60 Minutes Intravenous Every M-W-F (Hemodialysis) 01/30/18 0435 01/30/18 0906   01/30/18 1000  piperacillin-tazobactam (ZOSYN) IVPB 3.375 g  Status:  Discontinued     3.375 g 12.5 mL/hr over 240 Minutes Intravenous Every 12 hours 01/30/18 0435 01/30/18 0906   01/30/18 1000  ceFAZolin (ANCEF) 500 mg in dextrose 5 % 100 mL IVPB     500 mg 210 mL/hr over 30 Minutes Intravenous Every 12 hours 01/30/18 0906     01/29/18 2200  piperacillin-tazobactam (ZOSYN) IVPB 3.375 g     3.375 g 100 mL/hr over 30 Minutes Intravenous  Once 01/29/18 2146 01/29/18 2242   01/29/18 2200  vancomycin (VANCOCIN) IVPB 1000 mg/200 mL premix     1,000 mg 200 mL/hr over 60 Minutes Intravenous  Once 01/29/18 2146 01/29/18 2314      Medications   Scheduled  Meds: . aspirin EC  81 mg Oral Daily  . atorvastatin  80 mg Oral QHS  . Atropine Sulfate-NaCl  1 drop Ophthalmic BID  . carvedilol  6.25 mg Oral BID WC  . Chlorhexidine Gluconate Cloth  6 each Topical Q0600  . cholecalciferol  400 Units Oral Once per day on Mon Wed Fri  . docusate sodium  100 mg Oral BID  . famotidine  20 mg Oral Daily  . heparin  5,000 Units Subcutaneous Q8H  . insulin aspart  0-5 Units Subcutaneous QHS  . insulin aspart  0-9 Units Subcutaneous TID WC  . irbesartan  75 mg Oral Daily  . isosorbide mononitrate  60 mg Oral Daily  . multivitamin with minerals  2 tablet Oral Daily  . sevelamer carbonate  800 mg Oral TID WC   Continuous Infusions: .  ceFAZolin (ANCEF) IV 500 mg (01/30/18 1033)   PRN Meds:.acetaminophen **OR** acetaminophen, bisacodyl, HYDROcodone-acetaminophen, nitroGLYCERIN, ondansetron **OR** ondansetron (ZOFRAN) IV, traZODone   Data Review:   Micro Results Recent Results (from the past 240 hour(s))  Blood Culture (routine x 2)     Status: None (Preliminary result)   Collection Time: 01/29/18  9:55 PM  Result Value Ref Range Status   Specimen Description BLOOD RIGHT ANTECUBITAL  Final   Special Requests   Final    BOTTLES DRAWN AEROBIC AND ANAEROBIC Blood Culture adequate volume   Culture  Setup Time   Final    GRAM POSITIVE COCCI IN CLUSTERS IN BOTH AEROBIC AND ANAEROBIC BOTTLES CRITICAL RESULT CALLED TO, READ BACK BY AND VERIFIED WITH: KAREN HAYES 01/30/18 0839 JGF Performed at [facility], [address] [place],  [address]    Culture GRAM POSITIVE COCCI IN CLUSTERS  Final   Report Status PENDING  Incomplete  Blood Culture (routine x 2)     Status: None (Preliminary result)   Collection Time: 01/29/18  9:55 PM  Result Value Ref Range Status   Specimen Description BLOOD RIGHT ANTECUBITAL  Final   Special Requests   Final    BOTTLES DRAWN AEROBIC AND ANAEROBIC  Blood Culture adequate volume   Culture  Setup Time   Final     Organism ID to follow GRAM POSITIVE COCCI IN CLUSTERS IN BOTH AEROBIC AND ANAEROBIC BOTTLES CRITICAL RESULT CALLED TO, READ BACK BY AND VERIFIED WITH: KAREN HAYES 01/30/18 0839 JGF Performed at [facility], [address] [place], [place] [address]    Culture GRAM POSITIVE COCCI IN CLUSTERS  Final   Report Status PENDING  Incomplete  Blood Culture ID Panel (Reflexed)     Status: Abnormal   Collection Time: 01/29/18  9:55 PM  Result Value Ref Range Status   Enterococcus species NOT DETECTED NOT DETECTED Final   Vancomycin resistance NOT DETECTED NOT DETECTED Final   Listeria monocytogenes NOT DETECTED NOT DETECTED Final   Staphylococcus species DETECTED (A) NOT DETECTED Final    Comment: CRITICAL RESULT CALLED TO, READ BACK BY AND VERIFIED WITH: KAREN HAYES 01/30/18 0839 JGF    Staphylococcus aureus (BCID) DETECTED (A) NOT DETECTED Final    Comment: CRITICAL RESULT CALLED TO, READ BACK BY AND VERIFIED WITH: KAREN HAYES 01/30/18 0839 JGF Methicillin (oxacillin) susceptible Staphylococcus aureus (MSSA). Preferred therapy is anti staphylococcal beta lactam antibiotic (Cefazolin or Nafcillin), unless clinically contraindicated.    Methicillin resistance NOT DETECTED NOT DETECTED Final   Streptococcus species NOT DETECTED NOT DETECTED Final   Streptococcus agalactiae NOT DETECTED NOT DETECTED Final   Streptococcus pneumoniae NOT DETECTED NOT DETECTED Final   Streptococcus pyogenes NOT DETECTED NOT DETECTED Final   Acinetobacter baumannii NOT DETECTED NOT DETECTED Final   Enterobacteriaceae species NOT DETECTED NOT DETECTED Final   Enterobacter cloacae complex NOT DETECTED NOT DETECTED Final   Escherichia coli NOT DETECTED NOT DETECTED Final   Klebsiella oxytoca NOT DETECTED NOT DETECTED Final   Klebsiella pneumoniae NOT DETECTED NOT DETECTED Final   Proteus species NOT DETECTED NOT DETECTED Final   Serratia marcescens NOT DETECTED NOT DETECTED Final   Carbapenem  resistance NOT DETECTED NOT DETECTED Final   Haemophilus influenzae NOT DETECTED NOT DETECTED Final   Neisseria meningitidis NOT DETECTED NOT DETECTED Final   Pseudomonas aeruginosa NOT DETECTED NOT DETECTED Final   Candida albicans NOT DETECTED NOT DETECTED Final   Candida glabrata NOT DETECTED NOT DETECTED Final   Candida krusei NOT DETECTED NOT DETECTED Final   Candida parapsilosis NOT DETECTED NOT DETECTED Final   Candida tropicalis NOT DETECTED NOT DETECTED Final    Comment: Performed at [facility], [address] [place], [place] [address]  MRSA PCR Screening     Status: None   Collection Time: 01/29/18 10:55 PM  Result Value Ref Range Status   MRSA by PCR NEGATIVE NEGATIVE Final    Comment:        The GeneXpert MRSA Assay (FDA approved for NASAL specimens only), is one component of a comprehensive MRSA colonization surveillance program. It is not intended to diagnose MRSA infection nor to guide or monitor treatment for MRSA infections. Performed at [facility], [address] [place],  [address]     Radiology Reports Dg Tibia/fibula Left  Result Date: 01/29/2018 CLINICAL DATA:  Presents with ED via ems from home Having pain to both lower legs But states left leg is worse Increased pain with palpation Decreased pulses to foot EXAM: LEFT TIBIA AND FIBULA - 2 VIEW COMPARISON:  None. FINDINGS: No fracture.  No bone lesion. Knee and ankle joints are normally aligned. There are arterial vascular calcifications. Soft tissues are otherwise unremarkable. IMPRESSION: Negative Electronically Signed  By: Lajean Manes M.D.   On: 01/29/2018 20:58   US Venous Img Lower Unilateral Left  Result Date: 01/29/2018 CLINICAL DATA:  Left leg pain EXAM: LEFT LOWER EXTREMITY VENOUS DOPPLER ULTRASOUND TECHNIQUE: Gray-scale sonography with graded compression, as well as color Doppler and duplex ultrasound were performed to evaluate the lower extremity deep venous  systems from the level of the common femoral vein and including the common femoral, femoral, profunda femoral, popliteal and calf veins including the posterior tibial, peroneal and gastrocnemius veins when visible. The superficial great saphenous vein was also interrogated. Spectral Doppler was utilized to evaluate flow at rest and with distal augmentation maneuvers in the common femoral, femoral and popliteal veins. COMPARISON:  None. FINDINGS: Contralateral Common Femoral Vein: Respiratory phasicity is normal and symmetric with the symptomatic side. No evidence of thrombus. Normal compressibility. Common Femoral Vein: No evidence of thrombus. Normal compressibility, respiratory phasicity and response to augmentation. Saphenofemoral Junction: No evidence of thrombus. Normal compressibility and flow on color Doppler imaging. Profunda Femoral Vein: No evidence of thrombus. Normal compressibility and flow on color Doppler imaging. Femoral Vein: No evidence of thrombus. Normal compressibility, respiratory phasicity and response to augmentation. Popliteal Vein: No evidence of thrombus. Normal compressibility, respiratory phasicity and response to augmentation. Calf Veins: No evidence of thrombus. Normal compressibility and flow on color Doppler imaging. Superficial Great Saphenous Vein: No evidence of thrombus. Normal compressibility. Venous Reflux:  None. Other Findings:  None. IMPRESSION: No evidence of deep venous thrombosis. Electronically Signed   By: Rolm Baptise M.D.   On: 01/29/2018 18:36   Dg Toe Great Left  Result Date: 01/29/2018 CLINICAL DATA:  Open wound to the left great toe for several months. EXAM: LEFT GREAT TOE COMPARISON:  10/07/2017 FINDINGS: Degenerative changes in the first metatarsal-phalangeal joint. Soft tissue defect over the plantar aspect of the left first toe. There is underlying erosion of the distal phalangeal tuft cortex suggesting focal osteomyelitis. No evidence of acute fracture  or dislocation. Diffuse bone demineralization. Prominent vascular calcifications. IMPRESSION: Soft tissue defect over the plantar aspect of the left first toe with underlying erosion of the distal phalangeal tuft suggesting osteomyelitis. Electronically Signed   By: Lucienne Capers M.D.   On: 01/29/2018 21:30     CBC Recent Labs  Lab 01/29/18 1722 01/30/18 0627  WBC 13.2* 16.7*  HGB 13.2 11.9*  HCT 41.8 39.0  PLT 180 156  MCV 95.2 97.5  MCH 30.1 29.8  MCHC 31.6 30.5  RDW 18.7* 18.7*    Chemistries  Recent Labs  Lab 01/29/18 1722 01/30/18 0627 01/30/18 1345  NA 135 135 139  K 4.9 6.1* 3.2*  CL 91* 93* 98  CO2 26 25 27   GLUCOSE 216* 195* 122*  BUN 93* 97* 35*  CREATININE 15.41* 16.71* 6.72*  CALCIUM 8.6* 8.2* 8.2*   ------------------------------------------------------------------------------------------------------------------ estimated creatinine clearance is 7.1 mL/min (A) (by C-G formula based on SCr of 6.72 mg/dL (H)). ------------------------------------------------------------------------------------------------------------------ No results for input(s): HGBA1C in the last 72 hours. ------------------------------------------------------------------------------------------------------------------ No results for input(s): CHOL, HDL, LDLCALC, TRIG, CHOLHDL, LDLDIRECT in the last 72 hours. ------------------------------------------------------------------------------------------------------------------ No results for input(s): TSH, T4TOTAL, T3FREE, THYROIDAB in the last 72 hours.  Invalid input(s): FREET3 ------------------------------------------------------------------------------------------------------------------ No results for input(s): VITAMINB12, FOLATE, FERRITIN, TIBC, IRON, RETICCTPCT in the last 72 hours.  Coagulation profile Recent Labs  Lab 01/29/18 2136  INR 1.14    No results for input(s): DDIMER in the last 72 hours.  Cardiac Enzymes No  results for input(s): CKMB, TROPONINI, MYOGLOBIN  in the last 168 hours.  Invalid input(s): CK ------------------------------------------------------------------------------------------------------------------ Invalid input(s): [place]  Patient admitted with sepsis   1.  Sepsis,  with MSSA with left great toe osteomyelitis.    Continue broad-spectrum antibiotics appreciate podiatry input plan for surgery .  Positive MSSA in the blood 2.    Acute encephalopathy due to #1 now improved 3.  PAD.  Vascular surgery is consulted for further evaluation and treatment. 4.  End-stage renal disease on peritoneal dialysis.    Nephrology has been notified 5.  Diabetes type 2.  Continue to monitor blood sugars before meals and at bedtime and use insulin treatment during the hospital stay. 6. CHF, currently clinically compensated, continue maintenance therapy. 7.  Peripheral neuropathy, continue pain medication.      Code Status Orders  (From admission, onward)         Start     Ordered   01/29/18 2250  Full code  Continuous     01/29/18 2250        Code Status History    Date Active Date Inactive Code Status Order ID Comments User Context   10/24/2016 1055 10/25/2016 2243 Full Code [id number]  Hillary Bow, MD ED   10/17/2016 2143 10/19/2016 1440 Full Code [id number]  Vaughan Basta, MD Inpatient   09/04/2016 1708 09/05/2016 1500 Full Code [id number]  Epifanio Lesches, MD ED   08/22/2016 0938 08/22/2016 1833 Full Code [id number]  Algernon Huxley, MD Inpatient   06/27/2016 1121 06/27/2016 1846 Full Code [id number]  Algernon Huxley, MD Inpatient   10/03/2015 2235 10/04/2015 1724 Full Code [id number]  Harrie Foreman, MD Inpatient           Consults  NEPHROLOGY AND VASCULAR SURGERY   HEPRIN  Lab Results  Component Value Date   PLT 156 01/30/2018     Time Spent in minutes 35MIN Greater than 50% of time spent in care coordination and counseling patient regarding the  condition and plan of care.   Dustin Flock M.D on 01/30/2018 at 3:00 PM  Between 7am to 6pm - Pager - [phone]  After 6pm go to [email or web address] - Proofreader  [employer]   Office  [phone]

## 2018-01-30 NOTE — Progress Notes (Signed)
ANTICOAGULATION CONSULT NOTE - Initial Consult  Pharmacy Consult for heparin Indication: arterial embolism  Allergies  Allergen Reactions  . Chlorthalidone Anaphylaxis, Itching and Rash  . Fentanyl Rash  . Midazolam Rash  . Ace Inhibitors Other (See Comments)    Reaction:  Hyperkalemia, agitation   . Angiotensin Receptor Blockers Other (See Comments)    Reaction:  Hyperkalemia, agitation   . Norvasc [Amlodipine] Itching and Rash  . Phenergan [Promethazine Hcl] Anxiety    "antsy, can't sit still"    Patient Measurements: Height: 5' 4"  (162.6 cm) Weight: 122 lb 2.2 oz (55.4 kg) IBW/kg (Calculated) : 54.7 Heparin Dosing Weight: 55.4 kg  Vital Signs: Temp: 99.4 F (37.4 C) (10/11 1528) Temp Source: Oral (10/11 1528) BP: 136/63 (10/11 1827) Pulse Rate: 93 (10/11 1528)  Labs: Recent Labs    01/29/18 1722 01/29/18 2136 01/30/18 0627 01/30/18 1345  HGB 13.2  --  11.9*  --   HCT 41.8  --  39.0  --   PLT 180  --  156  --   APTT  --  28  --   --   LABPROT  --  14.5  --   --   INR  --  1.14  --   --   CREATININE 15.41*  --  16.71* 6.72*    Estimated Creatinine Clearance: 7.1 mL/min (A) (by C-G formula based on SCr of 6.72 mg/dL (H)).   Medical History: Past Medical History:  Diagnosis Date  . Anemia   . Anorexia   . Bacteremia due to Pseudomonas   . CHF (congestive heart failure) (HCC)    has not happened recently  . Complication of anesthesia 01/2016   WITH LAST STENT 10/17. PASsED OUT AND HAD TO BE AWAKENED  . Coronary artery disease involving left main coronary artery 01/2015   [facility]: 70% LM, p-mLAD 50-60% (Resting FFR 0.75), mRCA 80-90%, ~40 Ost OM & D1  . Dysrhythmia   . ESRD (end stage renal disease) on dialysis [facility])    ESRD secondary to acute kidney failure s/p CABG  DIALYSIS M/W/F  . Essential hypertension   . GERD (gastroesophageal reflux disease)   . Heart murmur    no treatment  . Hypercholesterolemia   . Myocardial infarction ([place]) 2016  . 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 Marland Kitchen Haithcock: CABG x 3, LIMA to LAD, SVG to RCA, SVG to OM3, EVH  . Sinusitis 2019  . Type II diabetes mellitus with complication (HCC)    CAD    Medications:  Infusions:  . sodium chloride 10 mL/hr at 01/30/18 1532  . [MAR Hold]  ceFAZolin (ANCEF) IV 500 mg (01/30/18 1033)  . heparin      Assessment: 66 yof now s/p peripheral vascular catheterization with arterial embolism. Pharmacy consulted to dose heparin. No initial bolus due to receiving heparin 5000 units IV x 1 in angio suite per Dr. Delana Meyer.   Goal of Therapy:  Heparin level 0.3-0.7 units/ml Monitor platelets by anticoagulation protocol: Yes   Plan:  Start heparin infusion at 800 units/hr Check anti-Xa level in 8 hours and daily while on heparin Continue to monitor H&H and platelets  Laural Benes, Pharm.D., BCPS Clinical Pharmacist 01/30/2018,6:45 PM

## 2018-01-30 NOTE — Anesthesia Procedure Notes (Signed)
Procedure Name: LMA Insertion Date/Time: 01/30/2018 4:22 PM Performed by: Jonna Clark, CRNA Pre-anesthesia Checklist: Patient identified, Patient being monitored, Timeout performed, Emergency Drugs available and Suction available Patient Re-evaluated:Patient Re-evaluated prior to induction Oxygen Delivery Method: Circle system utilized Preoxygenation: Pre-oxygenation with 100% oxygen Induction Type: IV induction Ventilation: Mask ventilation without difficulty LMA: LMA inserted LMA Size: 3.0 Tube type: Oral Number of attempts: 1 Placement Confirmation: positive ETCO2 and breath sounds checked- equal and bilateral Tube secured with: Tape Dental Injury: Teeth and Oropharynx as per pre-operative assessment

## 2018-01-30 NOTE — Care Management (Signed)
Elvera Bicker dialysis liaison notified of admission.

## 2018-01-30 NOTE — Progress Notes (Signed)
Pharmacy Antibiotic Note  Stephanie Ellis is a 67 y.o. female admitted on 01/29/2018 with osteomyelitis.  Pharmacy has been consulted for vanc/zosyn dosing. Patient received vanc 1g and zosyn 3.375g IV x 1 in ED  Plan: Will continue vanc 1g IV qMWD at HD  Will draw a pre-dialysis random vanc level @ 10/16 @ 0500. Will continue zosyn 3.375g IV q12h per CrCl < 20 ml/min  Goal pre-dialysis level < 20 - 25 mcg/mL  Height: 5' 4"  (162.6 cm) Weight: 130 lb (59 kg) IBW/kg (Calculated) : 54.7  Temp (24hrs), Avg:100.4 F (38 C), Min:98.2 F (36.8 C), Max:102 F (38.9 C)  Recent Labs  Lab 01/29/18 1722 01/29/18 2155 01/30/18 0015  WBC 13.2*  --   --   CREATININE 15.41*  --   --   LATICACIDVEN  --  2.0* 1.6    Estimated Creatinine Clearance: 3.1 mL/min (A) (by C-G formula based on SCr of 15.41 mg/dL (H)).    Allergies  Allergen Reactions  . Chlorthalidone Anaphylaxis, Itching and Rash  . Fentanyl Rash  . Midazolam Rash  . Ace Inhibitors Other (See Comments)    Reaction:  Hyperkalemia, agitation   . Angiotensin Receptor Blockers Other (See Comments)    Reaction:  Hyperkalemia, agitation   . Norvasc [Amlodipine] Itching and Rash  . Phenergan [Promethazine Hcl] Anxiety    "antsy, can't sit still"    Thank you for allowing pharmacy to be a part of this patient's care.  Tobie Lords, PharmD, BCPS Clinical Pharmacist 01/30/2018

## 2018-01-30 NOTE — Progress Notes (Signed)
Advanced care plan.  Purpose of the Encounter: CODE STATUS  Parties in Attendance: pt her self  Patient's Decision Capacity:intact  Subjective/Patient's story: Patient 67 year old with history of end-stage renal disease on peritoneal dialysis, peripheral neuropathy, CHF, hypertension type 2 diabetes presenting to the hospital with osteomyelitis of the foot   Objective/Medical story  Discussed with the patient regarding her desires for cardiac and pulmonary resuscitation.  Patient states that she would like to be a DNR does not wish to be intubated or resuscitated  Goals of care determination:  DNR   CODE STATUS: DNR   Time spent discussing advanced care planning: 16 minutes

## 2018-01-30 NOTE — Progress Notes (Signed)
[facility]  ROUNDING NOTE   Subjective:   [name] admitted to [facility] on 01/29/2018 for Peripheral vascular disease [facility]) [I73.9] Right leg pain [M79.604] Left leg pain [M79.605] Ulcer of great toe, left, with unspecified severity ([place]) [L97.529]  Objective:  Vital signs in last 24 hours:  Temp:  [98 F (36.7 C)-102 F (38.9 C)] 98 F (36.7 C) (10/11 0513) Pulse Rate:  [61-112] 61 (10/11 0622) Resp:  [16-20] 16 (10/11 0513) BP: (92-160)/(41-83) 92/52 (10/11 0611) SpO2:  [82 %-98 %] 91 % (10/11 0622) Weight:  [55.4 kg-59 kg] 55.4 kg (10/11 0513)  Weight change:  Filed Weights   01/29/18 1605 01/30/18 0513  Weight: 59 kg 55.4 kg    Intake/Output: I/O last 3 completed shifts: In: 110 [P.O.:60; IV Piggyback:50] Out: -    Intake/Output this shift:  No intake/output data recorded.  Physical Exam: General: NAD,   Head: Normocephalic, atraumatic. Moist oral mucosal membranes  Eyes: Anicteric, PERRL  Neck: Supple, trachea midline  Lungs:  Clear to auscultation  Heart: Regular rate and rhythm  Abdomen:  Soft, nontender,   Extremities:  left first toe with ischemic changes  Neurologic: Nonfocal, moving all four extremities  Skin: No lesions  Access: PD catheter, RIJ permcath    Basic Metabolic Panel: Recent Labs  Lab 01/29/18 1722 01/30/18 0627  NA 135 135  K 4.9 6.1*  CL 91* 93*  CO2 26 25  GLUCOSE 216* 195*  BUN 93* 97*  CREATININE 15.41* 16.71*  CALCIUM 8.6* 8.2*    Liver Function Tests: No results for input(s): AST, ALT, ALKPHOS, BILITOT, PROT, ALBUMIN in the last 168 hours. No results for input(s): LIPASE, AMYLASE in the last 168 hours. No results for input(s): AMMONIA in the last 168 hours.  CBC: Recent Labs  Lab 01/29/18 1722 01/30/18 0627  WBC 13.2* 16.7*  HGB 13.2 11.9*  HCT 41.8 39.0  MCV 95.2 97.5  PLT 180 156    Cardiac Enzymes: No results for input(s): CKTOTAL, CKMB, CKMBINDEX, TROPONINI in the last 168  hours.  BNP: Invalid input(s): POCBNP  CBG: Recent Labs  Lab 01/29/18 2321 01/30/18 0130 01/30/18 0737  GLUCAP 219* 196* 159*    Microbiology: Results for orders placed or performed during the hospital encounter of 01/29/18  Blood Culture (routine x 2)     Status: None (Preliminary result)   Collection Time: 01/29/18  9:55 PM  Result Value Ref Range Status   Specimen Description BLOOD RIGHT ANTECUBITAL  Final   Special Requests   Final    BOTTLES DRAWN AEROBIC AND ANAEROBIC Blood Culture adequate volume   Culture  Setup Time   Final    GRAM POSITIVE COCCI IN CLUSTERS IN BOTH AEROBIC AND ANAEROBIC BOTTLES CRITICAL RESULT CALLED TO, READ BACK BY AND VERIFIED WITH: KAREN HAYES 01/30/18 2122 JGF Performed at [facility], [address] [place], [place] [address]    Culture GRAM POSITIVE COCCI IN CLUSTERS  Final   Report Status PENDING  Incomplete  Blood Culture (routine x 2)     Status: None (Preliminary result)   Collection Time: 01/29/18  9:55 PM  Result Value Ref Range Status   Specimen Description BLOOD RIGHT ANTECUBITAL  Final   Special Requests   Final    BOTTLES DRAWN AEROBIC AND ANAEROBIC Blood Culture adequate volume   Culture  Setup Time   Final    Organism ID to follow GRAM POSITIVE COCCI IN CLUSTERS IN BOTH AEROBIC AND ANAEROBIC BOTTLES CRITICAL RESULT  CALLED TO, READ BACK BY AND VERIFIED WITH: KAREN HAYES 01/30/18 0839 JGF Performed at [facility], [address] [place], [place] [address]    Culture GRAM POSITIVE COCCI IN CLUSTERS  Final   Report Status PENDING  Incomplete  Blood Culture ID Panel (Reflexed)     Status: Abnormal   Collection Time: 01/29/18  9:55 PM  Result Value Ref Range Status   Enterococcus species NOT DETECTED NOT DETECTED Final   Vancomycin resistance NOT DETECTED NOT DETECTED Final   Listeria monocytogenes NOT DETECTED NOT DETECTED Final   Staphylococcus species DETECTED (A) NOT DETECTED Final    Comment:  CRITICAL RESULT CALLED TO, READ BACK BY AND VERIFIED WITH: KAREN HAYES 01/30/18 0839 JGF    Staphylococcus aureus (BCID) DETECTED (A) NOT DETECTED Final    Comment: CRITICAL RESULT CALLED TO, READ BACK BY AND VERIFIED WITH: KAREN HAYES 01/30/18 0839 JGF Methicillin (oxacillin) susceptible Staphylococcus aureus (MSSA). Preferred therapy is anti staphylococcal beta lactam antibiotic (Cefazolin or Nafcillin), unless clinically contraindicated.    Methicillin resistance NOT DETECTED NOT DETECTED Final   Streptococcus species NOT DETECTED NOT DETECTED Final   Streptococcus agalactiae NOT DETECTED NOT DETECTED Final   Streptococcus pneumoniae NOT DETECTED NOT DETECTED Final   Streptococcus pyogenes NOT DETECTED NOT DETECTED Final   Acinetobacter baumannii NOT DETECTED NOT DETECTED Final   Enterobacteriaceae species NOT DETECTED NOT DETECTED Final   Enterobacter cloacae complex NOT DETECTED NOT DETECTED Final   Escherichia coli NOT DETECTED NOT DETECTED Final   Klebsiella oxytoca NOT DETECTED NOT DETECTED Final   Klebsiella pneumoniae NOT DETECTED NOT DETECTED Final   Proteus species NOT DETECTED NOT DETECTED Final   Serratia marcescens NOT DETECTED NOT DETECTED Final   Carbapenem resistance NOT DETECTED NOT DETECTED Final   Haemophilus influenzae NOT DETECTED NOT DETECTED Final   Neisseria meningitidis NOT DETECTED NOT DETECTED Final   Pseudomonas aeruginosa NOT DETECTED NOT DETECTED Final   Candida albicans NOT DETECTED NOT DETECTED Final   Candida glabrata NOT DETECTED NOT DETECTED Final   Candida krusei NOT DETECTED NOT DETECTED Final   Candida parapsilosis NOT DETECTED NOT DETECTED Final   Candida tropicalis NOT DETECTED NOT DETECTED Final    Comment: Performed at [facility], [address] [place], [place] [address]  MRSA PCR Screening     Status: None   Collection Time: 01/29/18 10:55 PM  Result Value Ref Range Status   MRSA by PCR NEGATIVE NEGATIVE Final    Comment:         The GeneXpert MRSA Assay (FDA approved for NASAL specimens only), is one component of a comprehensive MRSA colonization surveillance program. It is not intended to diagnose MRSA infection nor to guide or monitor treatment for MRSA infections. Performed at [facility], [address] [place], [place] [address]     Coagulation Studies: Recent Labs    01/29/18 20-Jun-2134  LABPROT 14.5  INR 1.14    Urinalysis: No results for input(s): COLORURINE, LABSPEC, PHURINE, GLUCOSEU, HGBUR, BILIRUBINUR, KETONESUR, PROTEINUR, UROBILINOGEN, NITRITE, LEUKOCYTESUR in the last 72 hours.  Invalid input(s): APPERANCEUR    Imaging: Dg Tibia/fibula Left  Result Date: 01/29/2018 CLINICAL DATA:  Presents with ED via ems from home Having pain to both lower legs But states left leg is worse Increased pain with palpation Decreased pulses to foot EXAM: LEFT TIBIA AND FIBULA - 2 VIEW COMPARISON:  None. FINDINGS: No fracture.  No bone lesion. Knee and ankle joints are normally aligned. There are arterial vascular  calcifications. Soft tissues are otherwise unremarkable. IMPRESSION: Negative Electronically Signed   By: Lajean Manes M.D.   On: 01/29/2018 20:58   US Venous Img Lower Unilateral Left  Result Date: 01/29/2018 CLINICAL DATA:  Left leg pain EXAM: LEFT LOWER EXTREMITY VENOUS DOPPLER ULTRASOUND TECHNIQUE: Gray-scale sonography with graded compression, as well as color Doppler and duplex ultrasound were performed to evaluate the lower extremity deep venous systems from the level of the common femoral vein and including the common femoral, femoral, profunda femoral, popliteal and calf veins including the posterior tibial, peroneal and gastrocnemius veins when visible. The superficial great saphenous vein was also interrogated. Spectral Doppler was utilized to evaluate flow at rest and with distal augmentation maneuvers in the common femoral, femoral and popliteal veins. COMPARISON:  None.  FINDINGS: Contralateral Common Femoral Vein: Respiratory phasicity is normal and symmetric with the symptomatic side. No evidence of thrombus. Normal compressibility. Common Femoral Vein: No evidence of thrombus. Normal compressibility, respiratory phasicity and response to augmentation. Saphenofemoral Junction: No evidence of thrombus. Normal compressibility and flow on color Doppler imaging. Profunda Femoral Vein: No evidence of thrombus. Normal compressibility and flow on color Doppler imaging. Femoral Vein: No evidence of thrombus. Normal compressibility, respiratory phasicity and response to augmentation. Popliteal Vein: No evidence of thrombus. Normal compressibility, respiratory phasicity and response to augmentation. Calf Veins: No evidence of thrombus. Normal compressibility and flow on color Doppler imaging. Superficial Great Saphenous Vein: No evidence of thrombus. Normal compressibility. Venous Reflux:  None. Other Findings:  None. IMPRESSION: No evidence of deep venous thrombosis. Electronically Signed   By: Rolm Baptise M.D.   On: 01/29/2018 18:36   Dg Toe Great Left  Result Date: 01/29/2018 CLINICAL DATA:  Open wound to the left great toe for several months. EXAM: LEFT GREAT TOE COMPARISON:  10/07/2017 FINDINGS: Degenerative changes in the first metatarsal-phalangeal joint. Soft tissue defect over the plantar aspect of the left first toe. There is underlying erosion of the distal phalangeal tuft cortex suggesting focal osteomyelitis. No evidence of acute fracture or dislocation. Diffuse bone demineralization. Prominent vascular calcifications. IMPRESSION: Soft tissue defect over the plantar aspect of the left first toe with underlying erosion of the distal phalangeal tuft suggesting osteomyelitis. Electronically Signed   By: Lucienne Capers M.D.   On: 01/29/2018 21:30     Medications:   .  ceFAZolin (ANCEF) IV 500 mg (01/30/18 1033)   . aspirin EC  81 mg Oral Daily  . atorvastatin  80  mg Oral QHS  . Atropine Sulfate-NaCl  1 drop Ophthalmic BID  . carvedilol  6.25 mg Oral BID WC  . Chlorhexidine Gluconate Cloth  6 each Topical Q0600  . cholecalciferol  400 Units Oral Once per day on Mon Wed Fri  . docusate sodium  100 mg Oral BID  . famotidine  20 mg Oral Daily  . heparin  5,000 Units Subcutaneous Q8H  . insulin aspart  0-5 Units Subcutaneous QHS  . insulin aspart  0-9 Units Subcutaneous TID WC  . irbesartan  75 mg Oral Daily  . isosorbide mononitrate  60 mg Oral Daily  . multivitamin with minerals  2 tablet Oral Daily  . sevelamer carbonate  800 mg Oral TID WC   acetaminophen **OR** acetaminophen, bisacodyl, HYDROcodone-acetaminophen, nitroGLYCERIN, ondansetron **OR** ondansetron (ZOFRAN) IV, traZODone  Assessment/ Plan:  [name] is a 67 y.o. black female with end stage renal disease on peritoneal dialysis, hypertension, congestive heart failure, coronary artery disease status post CABG, diabetes mellitus type  II insulin dependent, GERD, peripheral vascular disease admitted to [facility] on 01/29/2018 for Peripheral vascular disease ([place]) [I73.9] Right leg pain [M79.604] Left leg pain [M79.605] Ulcer of great toe, left, with unspecified severity [facility]) [L97.529]   [facility] [place] [facility] CCPD 10 hours 5 exchanges 2 liter fills   1. End Stage Renal Disease with hyperkalemia: missed several days of peritoneal dialysis. Plan on back up hemodialysis today due to hyperkalemia - Hemodialysis for today through [facility] - May consider restarting peritoneal dialysis tonight.   2. Peripheral vascular disease: left first toe with gangrene and pain.  - Appreciate podiatry and vascular input.   3. Hypotension:  - midodrine PRN ordered - hold carvedilol  4. Anemia of chronic kidney disease: hemoglobin 11.9. Outpatient ESA management.   5. Secondary Hyperparathyroidism:  - sevelamer with meals.   6. Diabetes mellitus type II with chronic  kidney disease: insulin dependent -  Continue glucose control   LOS: 1 Daxen Lanum 10/11/201910:43 AM

## 2018-01-30 NOTE — Progress Notes (Signed)
PHARMACY - PHYSICIAN COMMUNICATION CRITICAL VALUE ALERT - BLOOD CULTURE IDENTIFICATION (BCID)  Stephanie Ellis is an 67 y.o. female who presented to [facility] on 01/29/2018 with a chief complaint of leg pain, fever confusion- osteo of the foot. Pt is ESRD pt on PD  Assessment:  Patient presents with confusion, fever, leg pain. X-rays suggestive of osteomyelitis.  (include suspected source if known)  Name of physician (or Provider) Contacted: Sh Patel  Current antibiotics: vancomycin/zosyn  Changes to prescribed antibiotics recommended:  Recommendations accepted by provider  Change abx to cefazolin 522m q 12 hr  Results for orders placed or performed during the hospital encounter of 01/29/18  Blood Culture ID Panel (Reflexed) (Collected: 01/29/2018  9:55 PM)  Result Value Ref Range   Enterococcus species NOT DETECTED NOT DETECTED   Vancomycin resistance NOT DETECTED NOT DETECTED   Listeria monocytogenes NOT DETECTED NOT DETECTED   Staphylococcus species DETECTED (A) NOT DETECTED   Staphylococcus aureus (BCID) DETECTED (A) NOT DETECTED   Methicillin resistance NOT DETECTED NOT DETECTED   Streptococcus species NOT DETECTED NOT DETECTED   Streptococcus agalactiae NOT DETECTED NOT DETECTED   Streptococcus pneumoniae NOT DETECTED NOT DETECTED   Streptococcus pyogenes NOT DETECTED NOT DETECTED   Acinetobacter baumannii NOT DETECTED NOT DETECTED   Enterobacteriaceae species NOT DETECTED NOT DETECTED   Enterobacter cloacae complex NOT DETECTED NOT DETECTED   Escherichia coli NOT DETECTED NOT DETECTED   Klebsiella oxytoca NOT DETECTED NOT DETECTED   Klebsiella pneumoniae NOT DETECTED NOT DETECTED   Proteus species NOT DETECTED NOT DETECTED   Serratia marcescens NOT DETECTED NOT DETECTED   Carbapenem resistance NOT DETECTED NOT DETECTED   Haemophilus influenzae NOT DETECTED NOT DETECTED   Neisseria meningitidis NOT DETECTED NOT DETECTED   Pseudomonas aeruginosa NOT DETECTED NOT DETECTED    Candida albicans NOT DETECTED NOT DETECTED   Candida glabrata NOT DETECTED NOT DETECTED   Candida krusei NOT DETECTED NOT DETECTED   Candida parapsilosis NOT DETECTED NOT DETECTED   Candida tropicalis NOT DETECTED NOT DETECTED   Stephanie Ellis, Pharm.D, BCPS Clinical Pharmacist 01/30/2018  10:44 AM

## 2018-01-30 NOTE — Consult Note (Signed)
[facility] Vascular Consult Note  MRN : [id number]  Stephanie Ellis is a 67 y.o. ([birth year]) female who presents with chief complaint of  Chief Complaint  Patient presents with  . Leg Pain   History of Present Illness:  The patient is a 67 year old female well-known to our practice.  We created the patient's dialysis access in the past.  The patient does have history of noncompliance.  The last time we saw the patient was for the creation of her laparoscopic peritoneal dialysis catheter access on October 30, 2017.  The patient has a past medical history of anemia, congestive heart failure, coronary artery disease, end-stage renal disease on chronic dialysis, hypertension, GERD, hyperlipidemia, myocardial infarction, type 2 diabetes who presented to the [facility] emergency department last night complaining of bilateral lower extremity pain and wound to the left foot.  Patient seen in dialysis.  The patient was very lethargic so the information for this consult was obtained by speaking to her medical team and medical records.  Seems that the patient was experiencing progressively worsening bilateral lower extremity pain with and without ambulation over the last several weeks.  The patient's left lower extremity was more painful than her right.  The patient also noted a "wound" to the underside of the left first toe.  The patient's left lower extremity pain has significantly increased over the last 2 to 3 days prompting her to seek medical attention.  The patient has been receiving local wound care from her podiatrist Dr. Amalia Hailey.  The patient denies any fever, nausea or vomiting however while in the emergency department.  No palpable or dopplerable pulses were noted to the bilateral lower extremity in the emergency department.  Vascular surgery was consulted by Dr. Duane Boston for possible endovascular intervention.  Current Facility-Administered Medications   Medication Dose Route Frequency Provider Last Rate Last Dose  . acetaminophen (TYLENOL) tablet 650 mg  650 mg Oral Q6H PRN Amelia Jo, MD       Or  . acetaminophen (TYLENOL) suppository 650 mg  650 mg Rectal Q6H PRN Amelia Jo, MD      . aspirin EC tablet 81 mg  81 mg Oral Daily Amelia Jo, MD      . atorvastatin (LIPITOR) tablet 80 mg  80 mg Oral QHS Amelia Jo, MD      . Atropine Sulfate-NaCl 0.01-0.9 % SOLN 1 drop  1 drop Ophthalmic BID Amelia Jo, MD      . bisacodyl (DULCOLAX) EC tablet 5 mg  5 mg Oral Daily PRN Amelia Jo, MD      . carvedilol (COREG) tablet 6.25 mg  6.25 mg Oral BID WC Amelia Jo, MD      . ceFAZolin (ANCEF) 500 mg in dextrose 5 % 100 mL IVPB  500 mg Intravenous Q12H Dustin Flock, MD 210 mL/hr at 01/30/18 1033 500 mg at 01/30/18 1033  . Chlorhexidine Gluconate Cloth 2 % PADS 6 each  6 each Topical Q0600 Lavonia Dana, MD   6 each at 01/30/18 0900  . cholecalciferol (VITAMIN D) tablet 400 Units  400 Units Oral Once per day on Mon Wed Fri Maier, Angela, MD      . docusate sodium (COLACE) capsule 100 mg  100 mg Oral BID Amelia Jo, MD      . famotidine (PEPCID) tablet 20 mg  20 mg Oral Daily Amelia Jo, MD      . heparin injection 5,000 Units  5,000 Units Subcutaneous Q8H Amelia Jo,  MD   5,000 Units at 01/30/18 0529  . HYDROcodone-acetaminophen (NORCO/VICODIN) 5-325 MG per tablet 1-2 tablet  1-2 tablet Oral Q4H PRN Amelia Jo, MD   2 tablet at 01/30/18 0131  . insulin aspart (novoLOG) injection 0-5 Units  0-5 Units Subcutaneous QHS Amelia Jo, MD      . insulin aspart (novoLOG) injection 0-9 Units  0-9 Units Subcutaneous TID WC Amelia Jo, MD   2 Units at 01/30/18 [phone]  . irbesartan (AVAPRO) tablet 75 mg  75 mg Oral Daily Amelia Jo, MD      . isosorbide mononitrate (IMDUR) 24 hr tablet 60 mg  60 mg Oral Daily Amelia Jo, MD      . multivitamin with minerals tablet 2 tablet  2 tablet Oral Daily Amelia Jo, MD      .  nitroGLYCERIN (NITROSTAT) SL tablet 0.4 mg  0.4 mg Sublingual Q5 min PRN Amelia Jo, MD      . ondansetron [facility]) tablet 4 mg  4 mg Oral Q6H PRN Amelia Jo, MD       Or  . ondansetron [facility]) injection 4 mg  4 mg Intravenous Q6H PRN Amelia Jo, MD   4 mg at 01/30/18 0529  . sevelamer carbonate (RENVELA) tablet 800 mg  800 mg Oral TID WC Amelia Jo, MD      . traZODone (DESYREL) tablet 25 mg  25 mg Oral QHS PRN Amelia Jo, MD       Past Medical History:  Diagnosis Date  . Anemia   . Anorexia   . Bacteremia due to Pseudomonas   . CHF (congestive heart failure) (HCC)    has not happened recently  . Complication of anesthesia 01/2016   WITH LAST STENT 10/17. PASsED OUT AND HAD TO BE AWAKENED  . Coronary artery disease involving left main coronary artery 01/2015   [facility]: 70% LM, p-mLAD 50-60% (Resting FFR 0.75), mRCA 80-90%, ~40 Ost OM & D1  . Dysrhythmia   . ESRD (end stage renal disease) on dialysis [facility])    ESRD secondary to acute kidney failure s/p CABG  DIALYSIS M/W/F  . Essential hypertension   . GERD (gastroesophageal reflux disease)   . Heart murmur    no treatment  . Hypercholesterolemia   . Myocardial infarction ([place]) 2016  . 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 Marland Kitchen Haithcock: CABG x 3, LIMA to LAD, SVG to RCA, SVG to OM3, EVH  . Sinusitis 2019  . Type II diabetes mellitus with complication (HCC)    CAD   Past Surgical History:  Procedure Laterality Date  . ABDOMINAL HYSTERECTOMY    . ARTERIOVENOUS GRAFT PLACEMENT  05/2016  . AV FISTULA PLACEMENT Left 05/30/2016   Procedure: ARTERIOVENOUS graft;  Surgeon: Algernon Huxley, MD;  Location: [facility] ORS;  Service: Vascular;  Laterality: Left;  . CAPD INSERTION N/A 10/30/2017   Procedure: LAPAROSCOPIC INSERTION CONTINUOUS AMBULATORY PERITONEAL DIALYSIS  (CAPD) CATHETER;  Surgeon: Algernon Huxley, MD;  Location: [facility] ORS;  Service: Vascular;  Laterality: N/A;  . CARDIAC CATHETERIZATION  01/2015   [facility]: Ost LM 70%, p-m  LAD 50-60% (Rest FFR + @ 0.75), mRCA 80-90%, ostD1 40%, pOM1 40%  . CATARACT EXTRACTION W/PHACO Left 01/18/2016   Procedure: CATARACT EXTRACTION PHACO AND INTRAOCULAR LENS PLACEMENT (IOC);  Surgeon: Eulogio Bear, MD;  Location: [facility] ORS;  Service: Ophthalmology;  Laterality: Left;  [place] 1.05 AP% 15.5 CDE 10.16 Fluid Pack Lot # [id number]  . CATARACT EXTRACTION W/PHACO Right 08/01/2016  Procedure: CATARACT EXTRACTION PHACO AND INTRAOCULAR LENS PLACEMENT (IOC);  Surgeon: Eulogio Bear, MD;  Location: [facility] ORS;  Service: Ophthalmology;  Laterality: Right;  [place] 01:00.6 AP% 11.4 CDE 6.93  LOT # [id number]  . COLONOSCOPY    . CORONARY ANGIOPLASTY     SENTS 02/12/16  . CORONARY ARTERY BYPASS GRAFT  03/28/15    [facility]: Dr. Waldemar Dickens: LIMA to LAD, SVG to RCA, SVG to OM3, EVH  . ESOPHAGOGASTRODUODENOSCOPY (EGD) WITH PROPOFOL N/A 10/24/2016   Procedure: ESOPHAGOGASTRODUODENOSCOPY (EGD) WITH PROPOFOL;  Surgeon: [name], MD;  Location: [facility] ENDOSCOPY;  Service: Endoscopy;  Laterality: N/A;  . EYE SURGERY Bilateral    cataract surgery  . EYE SURGERY     drains for glaucoma  . INSERTION EXPRESS TUBE SHUNT Right 08/01/2016   Procedure: INSERTION EXPRESS TUBE SHUNT;  Surgeon: Eulogio Bear, MD;  Location: [facility] ORS;  Service: Ophthalmology;  Laterality: Right;  . INSERTION OF AHMED VALVE Left 08/15/2016   Procedure: INSERTION OF AHMED VALVE;  Surgeon: Eulogio Bear, MD;  Location: [facility] ORS;  Service: Ophthalmology;  Laterality: Left;  . INSERTION OF DIALYSIS CATHETER    . TRANSTHORACIC ECHOCARDIOGRAM  January 2017    EF 60-65%. GR 2 DD. Mild degenerative mitral valve disease but no prolapse or regurgitation. Mild left atrial dilation. Mild to moderate LVH. Pericardial effusion gone  . UPPER EXTREMITY ANGIOGRAPHY Left 06/27/2016   Procedure: Upper Extremity Angiography;  Surgeon: Algernon Huxley, MD;  Location: [place] CV LAB;  Service: Cardiovascular;  Laterality: Left;  . UPPER EXTREMITY  ANGIOGRAPHY Left 08/22/2016   Procedure: Upper Extremity Angiography;  Surgeon: Algernon Huxley, MD;  Location: [place] CV LAB;  Service: Cardiovascular;  Laterality: Left;   Social History Social History   Tobacco Use  . Smoking status: Never Smoker  . Smokeless tobacco: Never Used  Substance Use Topics  . Alcohol use: No  . Drug use: No   Family History Family History  Problem Relation Age of Onset  . Diabetes Mellitus II Mother   . Pancreatic cancer Father   Denies family history of arterial disease, venous disease or renal disease.  Allergies  Allergen Reactions  . Chlorthalidone Anaphylaxis, Itching and Rash  . Fentanyl Rash  . Midazolam Rash  . Ace Inhibitors Other (See Comments)    Reaction:  Hyperkalemia, agitation   . Angiotensin Receptor Blockers Other (See Comments)    Reaction:  Hyperkalemia, agitation   . Norvasc [Amlodipine] Itching and Rash  . Phenergan [Promethazine Hcl] Anxiety    "antsy, can't sit still"   REVIEW OF SYSTEMS (Negative unless checked)  Constitutional: [] Weight loss  [] Fever  [] Chills Cardiac: [] Chest pain   [] Chest pressure   [] Palpitations   [] Shortness of breath when laying flat   [] Shortness of breath at rest   [] Shortness of breath with exertion. Vascular:  [x] Pain in legs with walking   [x] Pain in legs at rest   [x] Pain in legs when laying flat   [x] Claudication   [x] Pain in feet when walking  [x] Pain in feet at rest  [x] Pain in feet when laying flat   [] History of DVT   [] Phlebitis   [] Swelling in legs   [] Varicose veins   [x] Non-healing ulcers Pulmonary:   [] Uses home oxygen   [] Productive cough   [] Hemoptysis   [] Wheeze  [] COPD   [] Asthma Neurologic:  [] Dizziness  [] Blackouts   [] Seizures   [] History of stroke   [] History of TIA  [] Aphasia   [] Temporary blindness   []   Dysphagia   [] Weakness or numbness in arms   [] Weakness or numbness in legs Musculoskeletal:  [] Arthritis   [] Joint swelling   [] Joint pain   [] Low back pain Hematologic:   [] Easy bruising  [] Easy bleeding   [] Hypercoagulable state   [] Anemic  [] Hepatitis Gastrointestinal:  [] Blood in stool   [] Vomiting blood  [x] Gastroesophageal reflux/heartburn   [] Difficulty swallowing. Genitourinary:  [x] Chronic kidney disease   [] Difficult urination  [] Frequent urination  [] Burning with urination   [] Blood in urine Skin:  [] Rashes   [x] Ulcers   [x] Wounds Psychological:  [] History of anxiety   []  History of major depression.  Physical Examination  Vitals:   01/30/18 0611 01/30/18 0622 01/30/18 1140 01/30/18 1145  BP: (!) 92/52  (!) 119/48   Pulse: 75 61 73 73  Resp:   (!) 47 10  Temp:   98.4 F (36.9 C)   TempSrc:      SpO2: (!) 82% 91% 100%   Weight:      Height:       Body mass index is 20.96 kg/m. Gen:  NAD, Lethargic Head: [place]/AT, No temporalis wasting. Prominent temp pulse not noted. Ear/Nose/Throat: Hearing grossly intact, nares w/o erythema or drainage, oropharynx w/o Erythema/Exudate Eyes: Sclera non-icteric, conjunctiva clear Neck: Trachea midline.  No JVD.  Pulmonary:  Good air movement, respirations not labored, equal bilaterally.  Cardiac: RRR, normal S1, S2. Vascular:  Vessel Right Left  Radial Palpable Palpable  Ulnar Palpable Palpable  Brachial Palpable Palpable  Carotid Palpable, without bruit Palpable, without bruit  Aorta Not palpable N/A  Femoral Palpable Palpable  Popliteal Palpable Palpable  PT Non-Palpable Non-Palpable  DP Non-Palpable Non-Palpable   Left Lower Extremity: Extremity becomes cold about mid shin distally. Tender to palpation. Motor / sensory intact. Unable to doppler DP and PT pulse. Foot is darker in color - starting to become mottled. First toe is even darker in color. Atrophied Tip with dry gangrene. 1/2 inch non-weeping ulceration without necrotic tissue noted to bottom of the toe.   Right Lower Extremity: Warm the length of the extremity. Unable to doppler or palpate pedal pulses. No ulcerations noted. Non-tender to  palpation.   Gastrointestinal: soft, non-tender/non-distended. No guarding/reflex.  Musculoskeletal: M/S 5/5 throughout. No edema. Neurologic: Sensation grossly intact in extremities.  Symmetrical.  Speech is fluent. Motor exam as listed above. Psychiatric: Judgment intact, Mood & affect appropriate for pt's clinical situation. Dermatologic: As above. Lymph : No Cervical, Axillary, or Inguinal lymphadenopathy.  CBC Lab Results  Component Value Date   WBC 16.7 (H) 01/30/2018   HGB 11.9 (L) 01/30/2018   HCT 39.0 01/30/2018   MCV 97.5 01/30/2018   PLT 156 01/30/2018   BMET    Component Value Date/Time   NA 135 01/30/2018 0627   K 6.1 (H) 01/30/2018 0627   CL 93 (L) 01/30/2018 0627   CO2 25 01/30/2018 0627   GLUCOSE 195 (H) 01/30/2018 0627   BUN 97 (H) 01/30/2018 0627   CREATININE 16.71 (H) 01/30/2018 0627   CALCIUM 8.2 (L) 01/30/2018 0627   GFRNONAA 2 (L) 01/30/2018 0627   GFRAA 2 (L) 01/30/2018 0627   Estimated Creatinine Clearance: 2.9 mL/min (A) (by C-G formula based on SCr of 16.71 mg/dL (H)).  COAG Lab Results  Component Value Date   INR 1.14 01/29/2018   INR 1.16 10/30/2017   INR 1.04 10/09/2017   Radiology Dg Tibia/fibula Left  Result Date: 01/29/2018 CLINICAL DATA:  Presents with ED via ems from home Having pain to both  lower legs But states left leg is worse Increased pain with palpation Decreased pulses to foot EXAM: LEFT TIBIA AND FIBULA - 2 VIEW COMPARISON:  None. FINDINGS: No fracture.  No bone lesion. Knee and ankle joints are normally aligned. There are arterial vascular calcifications. Soft tissues are otherwise unremarkable. IMPRESSION: Negative Electronically Signed   By: Lajean Manes M.D.   On: 01/29/2018 20:58   US Venous Img Lower Unilateral Left  Result Date: 01/29/2018 CLINICAL DATA:  Left leg pain EXAM: LEFT LOWER EXTREMITY VENOUS DOPPLER ULTRASOUND TECHNIQUE: Gray-scale sonography with graded compression, as well as color Doppler and duplex  ultrasound were performed to evaluate the lower extremity deep venous systems from the level of the common femoral vein and including the common femoral, femoral, profunda femoral, popliteal and calf veins including the posterior tibial, peroneal and gastrocnemius veins when visible. The superficial great saphenous vein was also interrogated. Spectral Doppler was utilized to evaluate flow at rest and with distal augmentation maneuvers in the common femoral, femoral and popliteal veins. COMPARISON:  None. FINDINGS: Contralateral Common Femoral Vein: Respiratory phasicity is normal and symmetric with the symptomatic side. No evidence of thrombus. Normal compressibility. Common Femoral Vein: No evidence of thrombus. Normal compressibility, respiratory phasicity and response to augmentation. Saphenofemoral Junction: No evidence of thrombus. Normal compressibility and flow on color Doppler imaging. Profunda Femoral Vein: No evidence of thrombus. Normal compressibility and flow on color Doppler imaging. Femoral Vein: No evidence of thrombus. Normal compressibility, respiratory phasicity and response to augmentation. Popliteal Vein: No evidence of thrombus. Normal compressibility, respiratory phasicity and response to augmentation. Calf Veins: No evidence of thrombus. Normal compressibility and flow on color Doppler imaging. Superficial Great Saphenous Vein: No evidence of thrombus. Normal compressibility. Venous Reflux:  None. Other Findings:  None. IMPRESSION: No evidence of deep venous thrombosis. Electronically Signed   By: Rolm Baptise M.D.   On: 01/29/2018 18:36   Dg Toe Great Left  Result Date: 01/29/2018 CLINICAL DATA:  Open wound to the left great toe for several months. EXAM: LEFT GREAT TOE COMPARISON:  10/07/2017 FINDINGS: Degenerative changes in the first metatarsal-phalangeal joint. Soft tissue defect over the plantar aspect of the left first toe. There is underlying erosion of the distal phalangeal tuft  cortex suggesting focal osteomyelitis. No evidence of acute fracture or dislocation. Diffuse bone demineralization. Prominent vascular calcifications. IMPRESSION: Soft tissue defect over the plantar aspect of the left first toe with underlying erosion of the distal phalangeal tuft suggesting osteomyelitis. Electronically Signed   By: Lucienne Capers M.D.   On: 01/29/2018 21:30   Assessment/Plan The patient has a past medical history of anemia, congestive heart failure, coronary artery disease, end-stage renal disease on chronic dialysis, hypertension, GERD, hyperlipidemia, myocardial infarction, type 2 diabetes who presented to the [facility] with left lower extremity ischemia - Stable 1. Left Lower Extremity Ischemia: Patient's physical exam is consistent with left lower extremity ischemia. This is a limb threatening situation and the patient will need emergent endovascular intervention to reestablish arterial blood flow to prevent further tissue loss.  We will plan on a left lower extremity angiogram with possible intervention ASAP.  2. PAD: The patient will also need to undergo right lower extremity angiogram with possible intervention in the future however the left lower extremity is of an emergent nature. 3. ESRD: Patient presented to the emergency department with a potassium of 6.1.  Patient is undergoing emergent dialysis so that we may proceed with the left lower extremity angiogram as soon as  possible.  This is followed by her nephrologist. 4. Hyperlipidemia: Encouraged good control as its slows the progression of atherosclerotic disease. 5. Hypertension: Encouraged good control as its slows the progression of atherosclerotic disease. 6. Diabetes: Encouraged good control as its slows the progression of atherosclerotic disease.  Discussed with Dr. Francene Castle, PA-C  01/30/2018 11:53 AM  This note was created with Dragon medical transcription system.  Any error is purely  unintentional.

## 2018-01-30 NOTE — Op Note (Signed)
[facility]                                                      Patient Demographics  Stephanie Ellis, is a 67 y.o. female   MRN: [id number]   DOB - 01-01-1951  Admit Date - 01/29/2018    Outpatient Primary MD for the patient is System, Pcp Not In  Consult requested in the Hospital by Dustin Flock, MD, On 01/30/2018    Reason for consult nonhealing diabetic foot wound to the left great toe.   With History of -  Past Medical History:  Diagnosis Date  . Anemia   . Anorexia   . Bacteremia due to Pseudomonas   . CHF (congestive heart failure) (HCC)    has not happened recently  . Complication of anesthesia 01/2016   WITH LAST STENT 10/17. PASsED OUT AND HAD TO BE AWAKENED  . Coronary artery disease involving left main coronary artery 01/2015   [facility]: 70% LM, p-mLAD 50-60% (Resting FFR 0.75), mRCA 80-90%, ~40 Ost OM & D1  . Dysrhythmia   . ESRD (end stage renal disease) on dialysis [facility])    ESRD secondary to acute kidney failure s/p CABG  DIALYSIS M/W/F  . Essential hypertension   . GERD (gastroesophageal reflux disease)   . Heart murmur    no treatment  . Hypercholesterolemia   . Myocardial infarction ([place]) 2016  . 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 Marland Kitchen Haithcock: CABG x 3, LIMA to LAD, SVG to RCA, SVG to OM3, EVH  . Sinusitis 2019  . Type II diabetes mellitus with complication (HCC)    CAD      Past Surgical History:  Procedure Laterality Date  . ABDOMINAL HYSTERECTOMY    . ARTERIOVENOUS GRAFT PLACEMENT  05/2016  . AV FISTULA PLACEMENT Left 05/30/2016   Procedure: ARTERIOVENOUS graft;  Surgeon: Algernon Huxley, MD;  Location: [facility] ORS;  Service: Vascular;  Laterality: Left;  . CAPD INSERTION N/A 10/30/2017   Procedure: LAPAROSCOPIC INSERTION CONTINUOUS AMBULATORY PERITONEAL DIALYSIS  (CAPD) CATHETER;  Surgeon: Algernon Huxley, MD;   Location: [facility] ORS;  Service: Vascular;  Laterality: N/A;  . CARDIAC CATHETERIZATION  01/2015   [facility]: Ost LM 70%, p-m LAD 50-60% (Rest FFR + @ 0.75), mRCA 80-90%, ostD1 40%, pOM1 40%  . CATARACT EXTRACTION W/PHACO Left 01/18/2016   Procedure: CATARACT EXTRACTION PHACO AND INTRAOCULAR LENS PLACEMENT (IOC);  Surgeon: Eulogio Bear, MD;  Location: [facility] ORS;  Service: Ophthalmology;  Laterality: Left;  [place] 1.05 AP% 15.5 CDE 10.16 Fluid Pack Lot # [id number]  . CATARACT EXTRACTION W/PHACO Right 08/01/2016   Procedure: CATARACT EXTRACTION PHACO AND INTRAOCULAR LENS PLACEMENT (IOC);  Surgeon: Eulogio Bear, MD;  Location: [facility] ORS;  Service: Ophthalmology;  Laterality: Right;  [place] 01:00.6 AP% 11.4 CDE 6.93  LOT # [id number]  . COLONOSCOPY    . CORONARY ANGIOPLASTY     SENTS 02/12/16  . CORONARY ARTERY BYPASS GRAFT  03/28/15    [facility]: Dr. Waldemar Dickens: LIMA to LAD, SVG to RCA, SVG to OM3, EVH  . ESOPHAGOGASTRODUODENOSCOPY (EGD) WITH PROPOFOL N/A 10/24/2016   Procedure: ESOPHAGOGASTRODUODENOSCOPY (EGD) WITH PROPOFOL;  Surgeon: [name], MD;  Location[facility] ENDOSCOPY;  Service: Endoscopy;  Laterality: N/A;  . EYE SURGERY Bilateral    cataract surgery  . EYE  SURGERY     drains for glaucoma  . INSERTION EXPRESS TUBE SHUNT Right 08/01/2016   Procedure: INSERTION EXPRESS TUBE SHUNT;  Surgeon: Eulogio Bear, MD;  Location: [facility] ORS;  Service: Ophthalmology;  Laterality: Right;  . INSERTION OF AHMED VALVE Left 08/15/2016   Procedure: INSERTION OF AHMED VALVE;  Surgeon: Eulogio Bear, MD;  Location: [facility] ORS;  Service: Ophthalmology;  Laterality: Left;  . INSERTION OF DIALYSIS CATHETER    . TRANSTHORACIC ECHOCARDIOGRAM  January 2017    EF 60-65%. GR 2 DD. Mild degenerative mitral valve disease but no prolapse or regurgitation. Mild left atrial dilation. Mild to moderate LVH. Pericardial effusion gone  . UPPER EXTREMITY ANGIOGRAPHY Left 06/27/2016   Procedure: Upper Extremity Angiography;  Surgeon:  Algernon Huxley, MD;  Location: [place] CV LAB;  Service: Cardiovascular;  Laterality: Left;  . UPPER EXTREMITY ANGIOGRAPHY Left 08/22/2016   Procedure: Upper Extremity Angiography;  Surgeon: Algernon Huxley, MD;  Location: [place] CV LAB;  Service: Cardiovascular;  Laterality: Left;    in for   Chief Complaint  Patient presents with  . Leg Pain     HPI  Stephanie Ellis  is a 67 y.o. female, patient is diabetic and has had a nonhealing wound on the left hallux for several months.  She is [facility] who saw her think about 2 months ago at the last time.  She states she is maintained and had an ulcer on the foot for quite a while.  Off and on for over a year.  She states she was having increasing changes in the skin and pain and was admitted to the ER for sepsis.  She is followed I think with Dr. Leotis Pain in the past and currently is scheduled for a vascular procedure on the 14th.    Review of Systems    In addition to the HPI above,  No Fever-chills, No Headache, No changes with Vision or hearing, No problems swallowing food or Liquids, No Chest pain, Cough or Shortness of Breath, No Abdominal pain, No Nausea or Vommitting, Bowel movements are regular, No Blood in stool or Urine, No dysuria, No new skin rashes or bruises, No new joints pains-aches,  No new weakness, tingling, numbness in any extremity, No recent weight gain or loss, No polyuria, polydypsia or polyphagia, No significant Mental Stressors.  A full 10 point Review of Systems was done, except as stated above, all other Review of Systems were negative.   Social History Social History   Tobacco Use  . Smoking status: Never Smoker  . Smokeless tobacco: Never Used  Substance Use Topics  . Alcohol use: No    Family History Family History  Problem Relation Age of Onset  . Diabetes Mellitus II Mother   . Pancreatic cancer Father     Prior to Admission medications   Medication Sig Start  Date End Date Taking? Authorizing Provider  aspirin EC 81 MG tablet Take 1 tablet (81 mg total) by mouth daily. 10/19/16  Yes Dustin Flock, MD  atorvastatin (LIPITOR) 80 MG tablet Take 80 mg by mouth at bedtime.  05/05/17  Yes [provider]  calcitRIOL (ROCALTROL) 0.5 MCG capsule Take 0.5 mcg by mouth daily.   Yes [provider]  carvedilol (COREG) 3.125 MG tablet Take 3.125 mg by mouth 2 (two) times daily with a meal.  03/06/17 03/01/18 Yes [provider]  Cholecalciferol 1000 units tablet Take 1 tablet by mouth daily. Monday,  Wednesday and Friday at dialysis   Yes [provider]  insulin glargine (LANTUS) 100 unit/mL SOPN Inject 14 Units into the skin at bedtime.    Yes [provider]  isosorbide mononitrate (IMDUR) 30 MG 24 hr tablet Take 30 mg by mouth daily.    Yes [provider]  mirtazapine (REMERON) 7.5 MG tablet Take 7.5 mg by mouth at bedtime.   Yes [provider]  Multiple Vitamins-Minerals (PRORENAL + D) TABS Take 1 tablet by mouth daily.   Yes [provider]  nitroGLYCERIN (NITROSTAT) 0.4 MG SL tablet Place 1 tablet (0.4 mg total) under the tongue every 5 (five) minutes as needed for chest pain. 09/05/16  Yes Wieting, Richard, MD  Omega-3 300 MG CAPS Take 2 capsules by mouth daily.   Yes [provider]  valsartan (DIOVAN) 80 MG tablet Take 80 mg by mouth 2 (two) times daily.  07/16/17  Yes [provider]  ATROPINE SULFATE-NACL OP Apply 1 drop to eye 2 (two) times daily. Left eye only    [provider]  gentamicin cream (GARAMYCIN) 0.1 % APPLY TO EXIT SITE DAILY 11/10/17   [provider]  HYDROcodone-acetaminophen (NORCO) 5-325 MG tablet Take 1 tablet by mouth every 6 (six) hours as needed for moderate pain. Patient not taking: Reported on 01/29/2018 10/30/17   Algernon Huxley, MD  lisinopril (PRINIVIL,ZESTRIL) 10 MG tablet Take 10 mg by mouth daily. 11/04/16 11/04/17   [provider]  metoCLOPramide (REGLAN) 10 MG tablet Take 1 tablet (10 mg total) by mouth every 6 (six) hours as needed for nausea, vomiting or refractory nausea / vomiting. Patient not taking: Reported on 01/29/2018 10/25/16   Dustin Flock, MD  metoprolol tartrate (LOPRESSOR) 25 MG tablet Take 25 mg by mouth 2 (two) times daily.     [provider]  Multiple Vitamin (MULTIVITAMIN WITH MINERALS) TABS tablet Take 2 tablets by mouth daily. Gummy vitamins    [provider]  ondansetron (ZOFRAN) 4 MG tablet Take 4 mg by mouth every 8 (eight) hours as needed for nausea or vomiting.    [provider]  polyvinyl alcohol (LIQUIFILM TEARS) 1.4 % ophthalmic solution Place 1 drop into both eyes 2 (two) times daily.     [provider]  predniSONE (DELTASONE) 10 MG tablet Take 10 mg by mouth daily with breakfast. taper    [provider]  ranitidine (ZANTAC) 300 MG tablet Take 1 tablet (300 mg total) by mouth at bedtime. Patient not taking: Reported on 01/29/2018 09/27/17   Darel Hong, MD  sevelamer carbonate (RENVELA) 800 MG tablet Take 800 mg by mouth 3 (three) times daily with meals.    [provider]    Anti-infectives (From admission, onward)   Start     Dose/Rate Route Frequency Ordered Stop   01/30/18 1800  vancomycin (VANCOCIN) IVPB 1000 mg/200 mL premix  Status:  Discontinued     1,000 mg 200 mL/hr over 60 Minutes Intravenous Every M-W-F (Hemodialysis) 01/30/18 0435 01/30/18 0906   01/30/18 1000  piperacillin-tazobactam (ZOSYN) IVPB 3.375 g  Status:  Discontinued     3.375 g 12.5 mL/hr over 240 Minutes Intravenous Every 12 hours 01/30/18 0435 01/30/18 0906   01/30/18 1000  ceFAZolin (ANCEF) 500 mg in dextrose 5 % 100 mL IVPB     500 mg 210 mL/hr over 30 Minutes Intravenous Every 12 hours 01/30/18 0906     01/29/18 2200  piperacillin-tazobactam (ZOSYN) IVPB 3.375 g  3.375 g 100 mL/hr over 30 Minutes Intravenous  Once  01/29/18 2146 01/29/18 2242   01/29/18 2200  vancomycin (VANCOCIN) IVPB 1000 mg/200 mL premix     1,000 mg 200 mL/hr over 60 Minutes Intravenous  Once 01/29/18 2146 01/29/18 2314      Scheduled Meds: . aspirin EC  81 mg Oral Daily  . atorvastatin  80 mg Oral QHS  . Atropine Sulfate-NaCl  1 drop Ophthalmic BID  . carvedilol  6.25 mg Oral BID WC  . Chlorhexidine Gluconate Cloth  6 each Topical Q0600  . cholecalciferol  400 Units Oral Once per day on Mon Wed Fri  . docusate sodium  100 mg Oral BID  . famotidine  20 mg Oral Daily  . heparin  5,000 Units Subcutaneous Q8H  . insulin aspart  0-5 Units Subcutaneous QHS  . insulin aspart  0-9 Units Subcutaneous TID WC  . irbesartan  75 mg Oral Daily  . isosorbide mononitrate  60 mg Oral Daily  . multivitamin with minerals  2 tablet Oral Daily  . sevelamer carbonate  800 mg Oral TID WC   Continuous Infusions: .  ceFAZolin (ANCEF) IV 500 mg (01/30/18 1033)   PRN Meds:.acetaminophen **OR** acetaminophen, bisacodyl, HYDROcodone-acetaminophen, nitroGLYCERIN, ondansetron **OR** ondansetron (ZOFRAN) IV, traZODone  Allergies  Allergen Reactions  . Chlorthalidone Anaphylaxis, Itching and Rash  . Fentanyl Rash  . Midazolam Rash  . Ace Inhibitors Other (See Comments)    Reaction:  Hyperkalemia, agitation   . Angiotensin Receptor Blockers Other (See Comments)    Reaction:  Hyperkalemia, agitation   . Norvasc [Amlodipine] Itching and Rash  . Phenergan [Promethazine Hcl] Anxiety    "antsy, can't sit still"    Physical Exam  Vitals  Blood pressure (!) 106/51, pulse 85, temperature 98.4 F (36.9 C), resp. rate (!) 24, height 5' 4"  (1.626 m), weight 55.4 kg, SpO2 100 %.  Lower Extremity exam:  Vascular: DP pulses are nonpalpable.  The left leg starts getting notably cold from mid calf through the digits.  No capillary refill time on the left and significantly delayed on the right.  The left great toe is significantly discolored at this  point.  Patient does wince with pain whenever she moves her foot or leg.  Dermatological: Approximately 2 cm to 2 and half some meter wound lengthwise on the plantar left foot that is approximately 8 mm in width with about 5 to 6 mm of depth.  It shows necrosis to the entirety of the wound.  Really no heavy drainage but is showing early gangrenous changes in the region.  No purulent drainage or redness or inflammation is noted to the region.  Neurological: Diabetic neuropathy.   Data Review  CBC Recent Labs  Lab 01/29/18 1722 01/30/18 0627  WBC 13.2* 16.7*  HGB 13.2 11.9*  HCT 41.8 39.0  PLT 180 156  MCV 95.2 97.5  MCH 30.1 29.8  MCHC 31.6 30.5  RDW 18.7* 18.7*   ------------------------------------------------------------------------------------------------------------------  Chemistries  Recent Labs  Lab 01/29/18 1722 01/30/18 0627  NA 135 135  K 4.9 6.1*  CL 91* 93*  CO2 26 25  GLUCOSE 216* 195*  BUN 93* 97*  CREATININE 15.41* 16.71*  CALCIUM 8.6* 8.2*   ------------------------------------------------------------------------------------------------------------------ estimated creatinine clearance is 2.9 mL/min (A) (by C-G formula based on SCr of 16.71 mg/dL (H)). ------------------------------------------------------------------------------------------------------------------ No results for input(s): TSH, T4TOTAL, T3FREE, THYROIDAB in the last 72 hours.  Invalid input(s): FREET3 Urinalysis    Component Value Date/Time  COLORURINE RED (A) 10/17/2016 1643   APPEARANCEUR TURBID (A) 10/17/2016 1643   LABSPEC 1.014 10/17/2016 1643   PHURINE  10/17/2016 1643    TEST NOT REPORTED DUE TO COLOR INTERFERENCE OF URINE PIGMENT   GLUCOSEU (A) 10/17/2016 1643    TEST NOT REPORTED DUE TO COLOR INTERFERENCE OF URINE PIGMENT   HGBUR (A) 10/17/2016 1643    TEST NOT REPORTED DUE TO COLOR INTERFERENCE OF URINE PIGMENT   BILIRUBINUR (A) 10/17/2016 1643    TEST NOT REPORTED  DUE TO COLOR INTERFERENCE OF URINE PIGMENT   KETONESUR (A) 10/17/2016 1643    TEST NOT REPORTED DUE TO COLOR INTERFERENCE OF URINE PIGMENT   PROTEINUR (A) 10/17/2016 1643    TEST NOT REPORTED DUE TO COLOR INTERFERENCE OF URINE PIGMENT   NITRITE (A) 10/17/2016 1643    TEST NOT REPORTED DUE TO COLOR INTERFERENCE OF URINE PIGMENT   LEUKOCYTESUR (A) 10/17/2016 1643    TEST NOT REPORTED DUE TO COLOR INTERFERENCE OF URINE PIGMENT     Imaging results:   Dg Tibia/fibula Left  Result Date: 01/29/2018 CLINICAL DATA:  Presents with ED via ems from home Having pain to both lower legs But states left leg is worse Increased pain with palpation Decreased pulses to foot EXAM: LEFT TIBIA AND FIBULA - 2 VIEW COMPARISON:  None. FINDINGS: No fracture.  No bone lesion. Knee and ankle joints are normally aligned. There are arterial vascular calcifications. Soft tissues are otherwise unremarkable. IMPRESSION: Negative Electronically Signed   By: Lajean Manes M.D.   On: 01/29/2018 20:58   US Venous Img Lower Unilateral Left  Result Date: 01/29/2018 CLINICAL DATA:  Left leg pain EXAM: LEFT LOWER EXTREMITY VENOUS DOPPLER ULTRASOUND TECHNIQUE: Gray-scale sonography with graded compression, as well as color Doppler and duplex ultrasound were performed to evaluate the lower extremity deep venous systems from the level of the common femoral vein and including the common femoral, femoral, profunda femoral, popliteal and calf veins including the posterior tibial, peroneal and gastrocnemius veins when visible. The superficial great saphenous vein was also interrogated. Spectral Doppler was utilized to evaluate flow at rest and with distal augmentation maneuvers in the common femoral, femoral and popliteal veins. COMPARISON:  None. FINDINGS: Contralateral Common Femoral Vein: Respiratory phasicity is normal and symmetric with the symptomatic side. No evidence of thrombus. Normal compressibility. Common Femoral Vein: No  evidence of thrombus. Normal compressibility, respiratory phasicity and response to augmentation. Saphenofemoral Junction: No evidence of thrombus. Normal compressibility and flow on color Doppler imaging. Profunda Femoral Vein: No evidence of thrombus. Normal compressibility and flow on color Doppler imaging. Femoral Vein: No evidence of thrombus. Normal compressibility, respiratory phasicity and response to augmentation. Popliteal Vein: No evidence of thrombus. Normal compressibility, respiratory phasicity and response to augmentation. Calf Veins: No evidence of thrombus. Normal compressibility and flow on color Doppler imaging. Superficial Great Saphenous Vein: No evidence of thrombus. Normal compressibility. Venous Reflux:  None. Other Findings:  None. IMPRESSION: No evidence of deep venous thrombosis. Electronically Signed   By: Rolm Baptise M.D.   On: 01/29/2018 18:36   Dg Toe Great Left  Result Date: 01/29/2018 CLINICAL DATA:  Open wound to the left great toe for several months. EXAM: LEFT GREAT TOE COMPARISON:  10/07/2017 FINDINGS: Degenerative changes in the first metatarsal-phalangeal joint. Soft tissue defect over the plantar aspect of the left first toe. There is underlying erosion of the distal phalangeal tuft cortex suggesting focal osteomyelitis. No evidence of acute fracture or dislocation. Diffuse bone demineralization. Prominent  vascular calcifications. IMPRESSION: Soft tissue defect over the plantar aspect of the left first toe with underlying erosion of the distal phalangeal tuft suggesting osteomyelitis. Electronically Signed   By: Lucienne Capers M.D.   On: 01/29/2018 21:30    Assessment & Plan: After exam I called the vascular and asked them if they could expedite the evaluation and hopefully treatment for this left lower extremity vascular disease.  Spoke with Dr. Ella Jubilee and he is going to see if he can consult on her today and perhaps proceed if possible.  She has had problems  with significant potassium elevation and needs dialysis as well.  Currently I do not think the ulceration is of paramount need and we can deal with this once we see if the toes survives and if we need to consider amputation of the digit down the road.  Active Problems:   Sepsis [employer])   Family Communication: Plan discussed with patient  Albertine Patricia M.D on 01/30/2018 at 1:12 PM  Thank you for the consult, we will follow the patient with you in the Hospital.

## 2018-01-30 NOTE — Progress Notes (Signed)
This note also relates to the following rows which could not be included: Pulse Rate - Cannot attach notes to unvalidated device data Resp - Cannot attach notes to unvalidated device data BP - Cannot attach notes to unvalidated device data  Hd completed

## 2018-01-30 NOTE — Progress Notes (Signed)
This note also relates to the following rows which could not be included: Pulse Rate - Cannot attach notes to unvalidated device data Resp - Cannot attach notes to unvalidated device data BP - Cannot attach notes to unvalidated device data  Hd started

## 2018-01-30 NOTE — Progress Notes (Deleted)
[facility]  Daily Progress Note   Patient with diagnosis of sepsis. WBC: 16.7 K: 6.1 Unable to proceed to lower extremity angiogram.  Will plan for angiogram on Monday. Full consult to follow.  Marcelle Overlie PA-C 01/30/2018 8:09 AM

## 2018-01-30 NOTE — Progress Notes (Signed)
uf off due to low bp and nausea

## 2018-01-30 NOTE — Anesthesia Post-op Follow-up Note (Signed)
Anesthesia QCDR form completed.        

## 2018-01-30 NOTE — Anesthesia Preprocedure Evaluation (Signed)
Anesthesia Evaluation  Patient identified by MRN, date of birth, ID band Patient awake    Reviewed: Allergy & Precautions, NPO status , Patient's Chart, lab work & pertinent test results, reviewed documented beta blocker date and time   History of Anesthesia Complications (+) history of anesthetic complications  Airway Mallampati: II  TM Distance: >3 FB     Dental  (+) Chipped   Pulmonary           Cardiovascular hypertension, Pt. on medications and Pt. on home beta blockers + angina + CAD, + Past MI, + Cardiac Stents and +CHF  + dysrhythmias + Valvular Problems/Murmurs      Neuro/Psych    GI/Hepatic GERD  Controlled,  Endo/Other  diabetes, Type 2  Renal/GU ESRFRenal disease     Musculoskeletal   Abdominal   Peds  Hematology  (+) anemia ,   Anesthesia Other Findings   Reproductive/Obstetrics                             Anesthesia Physical Anesthesia Plan  ASA: IV  Anesthesia Plan: General   Post-op Pain Management:    Induction: Intravenous  PONV Risk Score and Plan:   Airway Management Planned:   Additional Equipment:   Intra-op Plan:   Post-operative Plan:   Informed Consent: I have reviewed the patients History and Physical, chart, labs and discussed the procedure including the risks, benefits and alternatives for the proposed anesthesia with the patient or authorized representative who has indicated his/her understanding and acceptance.     Plan Discussed with: CRNA  Anesthesia Plan Comments:         Anesthesia Quick Evaluation

## 2018-01-30 NOTE — Op Note (Signed)
Stephanie Ellis Percutaneous Study/Intervention Procedural Note   Date of Surgery: 01/30/2018  Surgeon:  Katha Cabal, MD.  Pre-operative Diagnosis: Ischemic left leg  Post-operative diagnosis: Ischemic left leg; arterial embolization   Procedure(s) Performed: 1. Introduction catheter into left lower extremity 3rd order catheter placement with third order catheter placement 2. Contrast injection left lower extremity for distal runoff   3. Percutaneous transluminal angioplasty left popliteal artery to 5 mm with Lutonix drug-eluting balloon              4. Mechanical aspiration of the left popliteal artery with the penumbra cat 6 device             5.  Mechanical aspiration of the left anterior tibial artery with the penumbra cat 6 device             6.  Mechanical aspiration of the left tibioperoneal trunk and peroneal artery with the penumbra cat 6 device             7.  Star close closure right common femoral arteriotomy  Anesthesia: Conscious sedation was administered under my direct supervision by the interventional radiology RN. IV Versed plus fentanyl were utilized. Continuous ECG, pulse oximetry and blood pressure was monitored throughout the entire procedure.  Conscious sedation was for a total of 131 minutes.  Sheath: 6 French 90 cm Pinnacle sheath right common femoral  Contrast: 65 cc  Fluoroscopy Time: 11.1 minutes  Indications: Stephanie Ellis (67) presents with left leg ischemia.  This represents a limb threatening problem.  Angiography with the hope for intervention and limb salvage is recommended.  The risks and benefits are reviewed all questions answered patient agrees to proceed.  Procedure: Stephanie Ellis is a 67 y.o. y.o. female who was identified and appropriate procedural time out was performed. The patient was then placed supine on the table and prepped and draped in the usual sterile  fashion.   Ultrasound was placed in the sterile sleeve and the right groin was evaluated the right common femoral artery was echolucent and pulsatile indicating patency.  Image was recorded for the permanent record and under real-time visualization a microneedle was inserted into the common femoral artery microwire followed by a micro-sheath.  A J-wire was then advanced through the micro-sheath and a  5 [place] sheath was then inserted over a J-wire. J-wire was then advanced and a 5 French pigtail catheter was positioned at the level of T12. AP projection of the aorta was then obtained. Pigtail catheter was repositioned to above the bifurcation and a RAO view of the pelvis was obtained.  Subsequently a pigtail catheter with the stiff angle Glidewire was used to cross the aortic bifurcation the catheter wire were advanced down into the left distal external iliac artery. Oblique view of the femoral bifurcation was then obtained and subsequently the wire was reintroduced and the pigtail catheter negotiated into the SFA representing third order catheter placement. Distal runoff was then performed.    At this point the angiogram shows patency of the aorta and bilateral common and external iliac arteries.  The left common femoral profunda femoris and superficial femoral artery all demonstrate diffuse disease but it is non-flow-limiting and actually quite mild.  There is an abrupt cut off of the mid popliteal consistent with an embolism and there is nonvisualization of all 3 tibial vessels.  It is at this point that I initiated intervention but I still have no knowledge of the anatomy of the tibial vessels.  5000 units of heparin was then given and allowed to circulate and a 90 cm Pinnacle destination sheath was advanced up and over the bifurcation and positioned in the superficial femoral artery  Seeker catheter and stiff angle Glidewire were then negotiated down into the distal popliteal.  The Glidewire and  seeker catheter are negotiated through the occlusion and into the anterior tibial.  The wire catheter combination was then negotiated all the way to the dorsalis pedis.  Hand-injection of contrast demonstrated that there is patency of the dorsalis pedis.  I therefore initiated thrombolysis and mechanical thrombectomy at this point but is only determined the anatomy of the anterior tibial.  The posterior tibial and peroneal remain an unknown.    12 units of TPA are then reconstituted and 20 cc and a total of 10 cc is laced across the anterior tibial and the popliteal.  The TPA was allowed to dwell for 20 minutes.  Following this multiple passes are made with the penumbra cat 6.  Specimen retrieved from the aspiration demonstrated multiple pieces of thrombus some of which are organized consistent with an embolism.  Follow-up imaging demonstrates a greater than 70% stenosis in the mid popliteal but there is otherwise patency of the anterior tibial to its midportion.  At this point a 4 mm x 60 mm Lutonix balloon was advanced across the lesion and inflated to 12 atm for 2 minutes.  Follow-up imaging demonstrated reocclusion of the tibial and the penumbra cat 6 was once again advanced and multiple passes were made again with the wire extending down the anterior tibial.  Follow-up angiogram now demonstrated patency of the popliteal and anterior tibial and its proximal two thirds which was the field-of-view and a 40% stenosis in the mid popliteal.  A 5 mile millimeter by 60 mm Lutonix drug-eluting balloon was then advanced across the popliteal lesion and inflated to 6 atm for 2 full minutes.  Follow-up imaging by hand-injection demonstrated less than 15% residual stenosis within the popliteal and patency of the popliteal and anterior tibial within the field-of-view.  The tibioperoneal trunk remains occluded.    The wire and catheter now negotiated down into the tibioperoneal trunk and then the posterior tibial.   Hand-injection of contrast was then demonstrated patency of the posterior tibial from its proximal portion distally.  It is at this point that I determined intervention is feasible and this represents an additional third order catheter placement.  I then renegotiated the wire into the peroneal and multiple passes using the CAT 6 device were made.  Follow-up imaging demonstrated complete resolution of the thrombus within the tibioperoneal trunk with patency of the posterior tibial and peroneal.  Specimen inspected again demonstrated several significant pieces of organized thrombus.  Hand-injection of contrast through the sheath which is sitting in the proximal popliteal now showed patency of the popliteal as noted above with less than 50% residual stenosis.  There is now flow through the trifurcation the posterior tibial and peroneal are patent down to the foot and fill the pedal arch.  Anterior tibial is patent down to the ankle where there appears to be significant spasm as earlier images demonstrated this area was patent.  After review of these images the sheath is pulled into the right external iliac oblique of the common femoral is obtained and a Star close device deployed. There no immediate complications.   Findings: The abdominal aorta is opacified with a bolus injection contrast. Renal arteries are single and patent. The aorta itself  has mild disease but no hemodynamically significant lesions. The common and external iliac arteries are widely patent bilaterally.  The left common femoral is widely patent as is the profunda femoris.  The SFA is patent however, the popliteal does indeed have a significant occlusion.  There is an abrupt cut off of the mid popliteal consistent with an embolism and there is nonvisualization of all 3 tibial vessels.  It is at this point that I initiated intervention but I still have no knowledge of the anatomy of the tibial vessels.  Following TPA infusion associated  with mechanical aspiration of the popliteal anterior tibial, tibioperoneal trunk and peroneal associated with angioplasty of the popliteal to 5 mm there is now patent of the popliteal with less than 50% stenosis with in-line flow via the posterior tibial and peroneal and looks quite nice.  Anterior tibial also appears to be patent with what seems to be spasm in its distal portion    Summary: Successful recanalization left lower extremity for limb salvage    Disposition: Patient was taken to the recovery room in stable condition having tolerated the procedure well.  Al Bracewell, Dolores Lory 01/30/2018,7:26 PM

## 2018-01-31 LAB — CBC
HCT: 35.1 % — ABNORMAL LOW (ref 36.0–46.0)
Hemoglobin: 10.6 g/dL — ABNORMAL LOW (ref 12.0–15.0)
MCH: 29.9 pg (ref 26.0–34.0)
MCHC: 30.2 g/dL (ref 30.0–36.0)
MCV: 99.2 fL (ref 80.0–100.0)
Platelets: 157 10*3/uL (ref 150–400)
RBC: 3.54 MIL/uL — ABNORMAL LOW (ref 3.87–5.11)
RDW: 18.9 % — ABNORMAL HIGH (ref 11.5–15.5)
WBC: 14.9 10*3/uL — ABNORMAL HIGH (ref 4.0–10.5)
nRBC: 0 % (ref 0.0–0.2)

## 2018-01-31 LAB — HEPARIN LEVEL (UNFRACTIONATED)
Heparin Unfractionated: 0.47 IU/mL (ref 0.30–0.70)
Heparin Unfractionated: 0.46 IU/mL (ref 0.30–0.70)

## 2018-01-31 LAB — HIV ANTIBODY (ROUTINE TESTING W REFLEX): HIV Screen 4th Generation wRfx: NONREACTIVE

## 2018-01-31 LAB — GLUCOSE, CAPILLARY
Glucose-Capillary: 91 mg/dL (ref 70–99)
Glucose-Capillary: 151 mg/dL — ABNORMAL HIGH (ref 70–99)
Glucose-Capillary: 196 mg/dL — ABNORMAL HIGH (ref 70–99)
Glucose-Capillary: 213 mg/dL — ABNORMAL HIGH (ref 70–99)

## 2018-01-31 MED ORDER — METOCLOPRAMIDE HCL 5 MG/ML IJ SOLN
5.0000 mg | Freq: Three times a day (TID) | INTRAMUSCULAR | Status: DC | PRN
  Administered 2018-01-31 (×2): 5 mg via INTRAVENOUS
  Filled 2018-01-31 (×3): qty 2

## 2018-01-31 MED ORDER — OXYCODONE-ACETAMINOPHEN 5-325 MG PO TABS
1.0000 | ORAL_TABLET | ORAL | Status: DC | PRN
  Administered 2018-01-31 (×3): 2 via ORAL
  Filled 2018-01-31 (×3): qty 2

## 2018-01-31 MED ORDER — ISOSORBIDE MONONITRATE ER 30 MG PO TB24
30.0000 mg | ORAL_TABLET | Freq: Every day | ORAL | Status: DC
  Administered 2018-02-01 – 2018-02-15 (×11): 30 mg via ORAL
  Filled 2018-01-31 (×10): qty 1

## 2018-01-31 MED ORDER — EPOETIN ALFA 10000 UNIT/ML IJ SOLN
4000.0000 [IU] | INTRAMUSCULAR | Status: DC
  Administered 2018-02-02 – 2018-02-14 (×6): 4000 [IU] via INTRAVENOUS

## 2018-01-31 MED ORDER — CEFAZOLIN SODIUM-DEXTROSE 1-4 GM/50ML-% IV SOLN
1.0000 g | INTRAVENOUS | Status: DC
  Administered 2018-02-01 – 2018-02-14 (×16): 1 g via INTRAVENOUS
  Filled 2018-01-31 (×19): qty 50

## 2018-01-31 MED ORDER — CARVEDILOL 6.25 MG PO TABS
3.1250 mg | ORAL_TABLET | Freq: Two times a day (BID) | ORAL | Status: DC
  Administered 2018-02-01 – 2018-02-15 (×20): 3.125 mg via ORAL
  Filled 2018-01-31 (×22): qty 1

## 2018-01-31 MED ORDER — MORPHINE SULFATE (PF) 2 MG/ML IV SOLN: 2.0000 mg | INTRAVENOUS | Status: DC | PRN

## 2018-01-31 NOTE — Significant Event (Signed)
Rapid Response Event Note  Overview: Rapid response was called to room 215 at 19:51.      Initial Focused Assessment:  Pt was found unresponsive, lying in bed, with respiratory bagging patient.  Pt was not responding to any stimuli.  Pt was a full code at the time, so an overhead Code Blue was called.   Interventions:  Pads were placed in order to monitor patient's heart rate/rhythm.  Patient never lost a pulse.  Blood sugar was checked by NT.  All vital signs were checked and monitored.  The ER physician was present and preparing for possible intubation.  Supervisor recorded events, pt's nurse remained at bedside as well.  Patient then became more alert, talking with staff.  Possible transfer to ICU was discussed, however there were not any prime doctors present even after being paged.  The ICU NP was called to assess for possible transfer.  She immediately came and the patient began telling the ICU NP that she wanted to be a DNR.  The ICU NP discussed what it meant to be a DNR with the patient after determining she was alert and oriented.  The patient kept telling us that she wanted to be a DNR and that she "was ready."  The ICU NP then made the patient a DNR and the decision was made for the patient to remain in room 215.  Patient's sister was at bedside.  Plan of Care (if not transferred):  Patient remained in room 215 with sister at bedside.  Patient awaiting other family members to arrive.  Plan was for patient to be transitioned to comfort care.  Event Summary:   at      at          [place]

## 2018-01-31 NOTE — Progress Notes (Signed)
   01/31/18 1958  Clinical Encounter Type  Visited With Patient and family together  Visit Type Initial;Spiritual support;Code;Critical Care  Referral From Nurse  Consult/Referral To Chaplain  Spiritual Encounters  Spiritual Needs Prayer;Emotional;Grief support   CH received a RR page and after responding to the RR the patient became a Code Blue. The patient's sister walked up as the code was being called and while she was shocked by the timing she controlled her emotions well. The patient was revived and was able to speak with me and others in the room. Ms. Maltz stated that she wanted to become a DNR and the NP took care of this paperwork for her and called her daughter to relay this information. The patient's sister stayed with Ms Manus as I prayed for her family to be at peace with this decision. I will follow up as needed.

## 2018-01-31 NOTE — Progress Notes (Signed)
[place] for heparin Indication: arterial embolism  Allergies  Allergen Reactions  . Chlorthalidone Anaphylaxis, Itching and Rash  . Fentanyl Rash  . Midazolam Rash  . Ace Inhibitors Other (See Comments)    Reaction:  Hyperkalemia, agitation   . Angiotensin Receptor Blockers Other (See Comments)    Reaction:  Hyperkalemia, agitation   . Norvasc [Amlodipine] Itching and Rash  . Phenergan [Promethazine Hcl] Anxiety    "antsy, can't sit still"    Patient Measurements: Height: 5' 4"  (162.6 cm) Weight: 122 lb 2.2 oz (55.4 kg) IBW/kg (Calculated) : 54.7 Heparin Dosing Weight: 55.4 kg  Vital Signs: Temp: 98.4 F (36.9 C) (10/12 1211) Temp Source: Oral (10/12 1211) BP: 117/57 (10/12 1211) Pulse Rate: 76 (10/12 1211)  Labs: Recent Labs    01/29/18 1722 01/29/18 2136 01/30/18 0627 01/30/18 1345 01/31/18 0351  HGB 13.2  --  11.9*  --  10.6*  HCT 41.8  --  39.0  --  35.1*  PLT 180  --  156  --  157  APTT  --  28  --   --   --   LABPROT  --  14.5  --   --   --   INR  --  1.14  --   --   --   HEPARINUNFRC  --   --   --   --  0.47  CREATININE 15.41*  --  16.71* 6.72*  --     Estimated Creatinine Clearance: 7.1 mL/min (A) (by C-G formula based on SCr of 6.72 mg/dL (H)).   Medical History: Past Medical History:  Diagnosis Date  . Anemia   . Anorexia   . Bacteremia due to Pseudomonas   . CHF (congestive heart failure) (HCC)    has not happened recently  . Complication of anesthesia 01/2016   WITH LAST STENT 10/17. PASsED OUT AND HAD TO BE AWAKENED  . Coronary artery disease involving left main coronary artery 01/2015   [facility]: 70% LM, p-mLAD 50-60% (Resting FFR 0.75), mRCA 80-90%, ~40 Ost OM & D1  . Dysrhythmia   . ESRD (end stage renal disease) on dialysis [facility])    ESRD secondary to acute kidney failure s/p CABG  DIALYSIS M/W/F  . Essential hypertension   . GERD (gastroesophageal reflux disease)   . Heart murmur    no treatment   . Hypercholesterolemia   . Myocardial infarction ([place]) 2016  . 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 Marland Kitchen [place]cock: CABG x 3, LIMA to LAD, SVG to RCA, SVG to OM3, EVH  . Sinusitis 2019  . Type II diabetes mellitus with complication (HCC)    CAD    Medications:  Infusions:  . sodium chloride 10 mL/hr at 01/30/18 1532  .  ceFAZolin (ANCEF) IV Stopped (01/31/18 1020)  . heparin 800 Units/hr (01/31/18 1159)    Assessment: 55 yof now s/p peripheral vascular catheterization with arterial embolism. Pharmacy consulted to dose heparin. No initial bolus due to receiving heparin 5000 units IV x 1 in angio suite per Dr. Delana Meyer. Hemoglobin is trending down following limb salvage angioplasty 10/11. No s/s of bleeding. Epo ordered with HD  Heparin was started at 800 units/hr 10/12 0351 HL 0.47: rate continued 10/12 1307 HL 0.46  Goal of Therapy:  Heparin level 0.3-0.7 units/ml Monitor platelets by anticoagulation protocol: Yes   Plan:  Second consecutive heparin level is therapeutic. Continue heparin infusion at 800 units/hr Check anti-Xa level daily with  H&H and platelets  Dallie Piles, Pharm.D. Clinical Pharmacist 01/31/2018,1:34 PM

## 2018-01-31 NOTE — Progress Notes (Signed)
PHARMACY NOTE:  ANTIMICROBIAL RENAL DOSAGE ADJUSTMENT  Current antimicrobial regimen includes a mismatch between antimicrobial dosage and estimated renal function.  As per policy approved by the Pharmacy & Therapeutics and Medical Executive Committees, the antimicrobial dosage will be adjusted accordingly.  Current antimicrobial dosage:  Cefazolin 500 mg IV q12h  Indication: MSSA bacteremia  Renal Function:  Per MD notes, patient does not want peritoneal dialysis anymore, continue hemodialysis through right IJ permacath Monday, Wednesday, Friday.  Estimated Creatinine Clearance: 7.1 mL/min (A) (by C-G formula based on SCr of 6.72 mg/dL (H)). [x]      On intermittent HD, scheduled: []      On CRRT    Antimicrobial dosage has been changed to:  Cefazolin 1 g IV q24h PM    Thank you for allowing pharmacy to be a part of this patient's care.  Rocky Morel, [facility] 01/31/2018 2:10 PM

## 2018-01-31 NOTE — Progress Notes (Signed)
[name]                                                                                                                                                                                  Patient Demographics   Stephanie Ellis, is a 67 y.o. female, DOB - 29-Aug-1950, MMN:[id number]  Admit date - 01/29/2018   Admitting Physician Amelia Jo, MD  Outpatient Primary MD for the patient is System, Pcp Not In   LOS - 2  Subjective: Status post left leg angiogram by vascular yesterday for ischemia of left leg, status post angioplasty of left popliteal artery, left foot is warm, but left great toe is still cyanotic.  Review of Systems:   CONSTITUTIONAL: No documented fever. No fatigue, weakness. No weight gain, no weight loss.  EYES: No blurry or double vision.  ENT: No tinnitus. No postnasal drip. No redness of the oropharynx.  RESPIRATORY: No cough, no wheeze, no hemoptysis. No dyspnea.  CARDIOVASCULAR: No chest pain. No orthopnea. No palpitations. No syncope.  GASTROINTESTINAL: No nausea, no vomiting or diarrhea. No abdominal pain. No melena or hematochezia.  GENITOURINARY: No dysuria or hematuria.  ENDOCRINE: No polyuria or nocturia. No heat or cold intolerance.  HEMATOLOGY: No anemia. No bruising. No bleeding.  INTEGUMENTARY: No rashes. No lesions.  MUSCULOSKELETAL: No arthritis. No swelling. No gout.  Left foot osteomyelitis, NEUROLOGIC: No numbness, tingling, or ataxia. No seizure-type activity.  PSYCHIATRIC: No anxiety. No insomnia. No ADD.    Vitals:   Vitals:   01/30/18 2145 01/31/18 0035 01/31/18 0604 01/31/18 0924  BP: (!) 147/55 (!) 111/52 (!) 112/46 (!) 110/45  Pulse: 96 81 72 65  Resp: 16 16 16    Temp: 100.3 F (37.9 C) 98.6 F (37 C) 98.4 F (36.9 C)   TempSrc: Oral Oral Oral   SpO2: 93% 100% 93%   Weight:      Height:        Wt Readings from Last 3 Encounters:  01/30/18 55.4 kg  10/15/17 58.1 kg  10/09/17 58.1 kg      Intake/Output Summary (Last 24 hours) at 01/31/2018 1156 Last data filed at 01/31/2018 0500 Gross per 24 hour  Intake 936.14 ml  Output -303 ml  Net 1239.14 ml    Physical Exam:   GENERAL: Pleasant-appearing in no apparent distress.  HEAD, EYES, EARS, NOSE AND THROAT: Atraumatic, normocephalic. Extraocular muscles are intact. Pupils equal and reactive to light. Sclerae anicteric. No conjunctival injection. No oro-pharyngeal erythema.  NECK: Supple. There is no jugular venous distention. No bruits, no lymphadenopathy, no thyromegaly.  HEART: Regular rate and rhythm,. No murmurs, no rubs, no clicks.  LUNGS: Clear  to auscultation bilaterally. No rales or rhonchi. No wheezes.  ABDOMEN: Soft, flat, nontender, nondistended. Has good bowel sounds. No hepatosplenomegaly appreciated.  EXTREMITIES: Left great toe with ulcer, still has cyanosis of the left great toe. NEUROLOGIC: The patient is alert, awake, and oriented x3 with no focal motor or sensory deficits appreciated bilaterally.  SKIN: Moist and warm with no rashes appreciated.  Psych: Not anxious, depressed LN: No inguinal LN enlargement    Antibiotics   Anti-infectives (From admission, onward)   Start     Dose/Rate Route Frequency Ordered Stop   01/30/18 1800  vancomycin (VANCOCIN) IVPB 1000 mg/200 mL premix  Status:  Discontinued     1,000 mg 200 mL/hr over 60 Minutes Intravenous Every M-W-F (Hemodialysis) 01/30/18 0435 01/30/18 0906   01/30/18 1000  piperacillin-tazobactam (ZOSYN) IVPB 3.375 g  Status:  Discontinued     3.375 g 12.5 mL/hr over 240 Minutes Intravenous Every 12 hours 01/30/18 0435 01/30/18 0906   01/30/18 1000  ceFAZolin (ANCEF) 500 mg in dextrose 5 % 100 mL IVPB     500 mg 210 mL/hr over 30 Minutes Intravenous Every 12 hours 01/30/18 0906     01/29/18 2200  piperacillin-tazobactam (ZOSYN) IVPB 3.375 g     3.375 g 100 mL/hr over 30 Minutes Intravenous  Once 01/29/18 2146 01/29/18 2242   01/29/18 2200   vancomycin (VANCOCIN) IVPB 1000 mg/200 mL premix     1,000 mg 200 mL/hr over 60 Minutes Intravenous  Once 01/29/18 2146 01/29/18 2314      Medications   Scheduled Meds: . aspirin EC  81 mg Oral Daily  . atorvastatin  80 mg Oral QHS  . Atropine Sulfate-NaCl  1 drop Ophthalmic BID  . carvedilol  3.125 mg Oral BID WC  . Chlorhexidine Gluconate Cloth  6 each Topical Q0600  . cholecalciferol  400 Units Oral Once per day on Mon Wed Fri  . docusate sodium  100 mg Oral BID  . [START ON 02/02/2018] epoetin (EPOGEN/PROCRIT) injection  4,000 Units Intravenous Q M,W,F-HD  . famotidine  20 mg Oral Daily  . feeding supplement (NEPRO CARB STEADY)  237 mL Oral BID BM  . insulin aspart  0-5 Units Subcutaneous QHS  . insulin aspart  0-9 Units Subcutaneous TID WC  . irbesartan  75 mg Oral Daily  . [START ON 02/01/2018] isosorbide mononitrate  30 mg Oral Daily  . multivitamin  1 tablet Oral QHS  . multivitamin with minerals  1 tablet Oral Daily  . multivitamin-lutein  1 capsule Oral Daily  . sevelamer carbonate  800 mg Oral TID WC   Continuous Infusions: . sodium chloride 10 mL/hr at 01/30/18 1532  .  ceFAZolin (ANCEF) IV 500 mg (01/31/18 0939)  . heparin 800 Units/hr (01/31/18 0500)   PRN Meds:.acetaminophen **OR** acetaminophen, bisacodyl, morphine injection, nitroGLYCERIN, ondansetron **OR** ondansetron (ZOFRAN) IV, oxyCODONE-acetaminophen, traZODone   Data Review:   Micro Results Recent Results (from the past 240 hour(s))  Blood Culture (routine x 2)     Status: Abnormal (Preliminary result)   Collection Time: 01/29/18  9:55 PM  Result Value Ref Range Status   Specimen Description   Final    BLOOD RIGHT ANTECUBITAL Performed at [facility], [address] [place], [place] [address]    Special Requests   Final    BOTTLES DRAWN AEROBIC AND ANAEROBIC Blood Culture adequate volume Performed at [facility], [address], [place], [place] [address]    Culture   Setup Time  Final    GRAM POSITIVE COCCI IN CLUSTERS IN BOTH AEROBIC AND ANAEROBIC BOTTLES CRITICAL RESULT CALLED TO, READ BACK BY AND VERIFIED WITH: KAREN HAYES 01/30/18 0839 JGF Performed at [facility], [address] [place], [place] [address]    Culture STAPHYLOCOCCUS AUREUS (A)  Final   Report Status PENDING  Incomplete  Blood Culture (routine x 2)     Status: Abnormal (Preliminary result)   Collection Time: 01/29/18  9:55 PM  Result Value Ref Range Status   Specimen Description   Final    BLOOD RIGHT ANTECUBITAL Performed at [facility], [address] [place], [place] [address]    Special Requests   Final    BOTTLES DRAWN AEROBIC AND ANAEROBIC Blood Culture adequate volume Performed at [facility], [address] [place], [place] [address]    Culture  Setup Time   Final    Organism ID to follow [place] IN BOTH AEROBIC AND ANAEROBIC BOTTLES CRITICAL RESULT CALLED TO, READ BACK BY AND VERIFIED WITH: KAREN HAYES 01/30/18 0839 JGF Performed at [facility], [address] [place], [place] [address]    Culture (A)  Final    STAPHYLOCOCCUS AUREUS SUSCEPTIBILITIES TO FOLLOW Performed at [facility], [place] [address], [place], [place] [address]    Report Status PENDING  Incomplete  Blood Culture ID Panel (Reflexed)     Status: Abnormal   Collection Time: 01/29/18  9:55 PM  Result Value Ref Range Status   Enterococcus species NOT DETECTED NOT DETECTED Final   Vancomycin resistance NOT DETECTED NOT DETECTED Final   Listeria monocytogenes NOT DETECTED NOT DETECTED Final   Staphylococcus species DETECTED (A) NOT DETECTED Final    Comment: CRITICAL RESULT CALLED TO, READ BACK BY AND VERIFIED WITH: KAREN HAYES 01/30/18 0839 JGF    Staphylococcus aureus (BCID) DETECTED (A) NOT DETECTED Final    Comment: CRITICAL RESULT CALLED TO, READ BACK BY AND VERIFIED WITH: KAREN HAYES 01/30/18 0839 JGF Methicillin  (oxacillin) susceptible Staphylococcus aureus (MSSA). Preferred therapy is anti staphylococcal beta lactam antibiotic (Cefazolin or Nafcillin), unless clinically contraindicated.    Methicillin resistance NOT DETECTED NOT DETECTED Final   Streptococcus species NOT DETECTED NOT DETECTED Final   Streptococcus agalactiae NOT DETECTED NOT DETECTED Final   Streptococcus pneumoniae NOT DETECTED NOT DETECTED Final   Streptococcus pyogenes NOT DETECTED NOT DETECTED Final   Acinetobacter baumannii NOT DETECTED NOT DETECTED Final   Enterobacteriaceae species NOT DETECTED NOT DETECTED Final   Enterobacter cloacae complex NOT DETECTED NOT DETECTED Final   Escherichia coli NOT DETECTED NOT DETECTED Final   Klebsiella oxytoca NOT DETECTED NOT DETECTED Final   Klebsiella pneumoniae NOT DETECTED NOT DETECTED Final   Proteus species NOT DETECTED NOT DETECTED Final   Serratia marcescens NOT DETECTED NOT DETECTED Final   Carbapenem resistance NOT DETECTED NOT DETECTED Final   Haemophilus influenzae NOT DETECTED NOT DETECTED Final   Neisseria meningitidis NOT DETECTED NOT DETECTED Final   Pseudomonas aeruginosa NOT DETECTED NOT DETECTED Final   Candida albicans NOT DETECTED NOT DETECTED Final   Candida glabrata NOT DETECTED NOT DETECTED Final   Candida krusei NOT DETECTED NOT DETECTED Final   Candida parapsilosis NOT DETECTED NOT DETECTED Final   Candida tropicalis NOT DETECTED NOT DETECTED Final    Comment: Performed at [facility], [address], [place], [place] [address]  MRSA PCR Screening     Status: None   Collection Time: 01/29/18 10:55 PM  Result Value Ref Range Status   MRSA by PCR NEGATIVE NEGATIVE Final    Comment:        The GeneXpert MRSA Assay (FDA approved for NASAL specimens only), is one component of a comprehensive MRSA colonization surveillance program. It is not intended to diagnose MRSA infection nor to guide or monitor treatment for MRSA infections. Performed at  [facility], [address] [place], [place] [address]   Culture, blood (Routine X 2) w Reflex to ID Panel     Status: None (Preliminary result)   Collection Time: 01/30/18  9:27 AM  Result Value Ref Range Status   Specimen Description BLOOD RIGHT ANTECUBITAL  Final   Special Requests   Final    BOTTLES DRAWN AEROBIC AND ANAEROBIC Blood Culture adequate volume   Culture   Final    NO GROWTH < 24 HOURS Performed at [facility], [address], [place], [place] [address]    Report Status PENDING  Incomplete    Radiology Reports Dg Tibia/fibula Left  Result Date: 01/29/2018 CLINICAL DATA:  Presents with ED via ems from home Having pain to both lower legs But states left leg is worse Increased pain with palpation Decreased pulses to foot EXAM: LEFT TIBIA AND FIBULA - 2 VIEW COMPARISON:  None. FINDINGS: No fracture.  No bone lesion. Knee and ankle joints are normally aligned. There are arterial vascular calcifications. Soft tissues are otherwise unremarkable. IMPRESSION: Negative Electronically Signed   By: Lajean Manes M.D.   On: 01/29/2018 20:58   US Venous Img Lower Unilateral Left  Result Date: 01/29/2018 CLINICAL DATA:  Left leg pain EXAM: LEFT LOWER EXTREMITY VENOUS DOPPLER ULTRASOUND TECHNIQUE: Gray-scale sonography with graded compression, as well as color Doppler and duplex ultrasound were performed to evaluate the lower extremity deep venous systems from the level of the common femoral vein and including the common femoral, femoral, profunda femoral, popliteal and calf veins including the posterior tibial, peroneal and gastrocnemius veins when visible. The superficial great saphenous vein was also interrogated. Spectral Doppler was utilized to evaluate flow at rest and with distal augmentation maneuvers in the common femoral, femoral and popliteal veins. COMPARISON:  None. FINDINGS: Contralateral Common Femoral Vein: Respiratory phasicity is normal and symmetric  with the symptomatic side. No evidence of thrombus. Normal compressibility. Common Femoral Vein: No evidence of thrombus. Normal compressibility, respiratory phasicity and response to augmentation. Saphenofemoral Junction: No evidence of thrombus. Normal compressibility and flow on color Doppler imaging. Profunda Femoral Vein: No evidence of thrombus. Normal compressibility and flow on color Doppler imaging. Femoral Vein: No evidence of thrombus. Normal compressibility, respiratory phasicity and response to augmentation. Popliteal Vein: No evidence of thrombus. Normal compressibility, respiratory phasicity and response to augmentation. Calf Veins: No evidence of thrombus. Normal compressibility and flow on color Doppler imaging. Superficial Great Saphenous Vein: No evidence of thrombus. Normal compressibility. Venous Reflux:  None. Other Findings:  None. IMPRESSION: No evidence of deep venous thrombosis. Electronically Signed   By: Rolm Baptise M.D.   On: 01/29/2018 18:36   Dg Toe Great Left  Result Date: 01/29/2018 CLINICAL DATA:  Open wound to the left great toe for several months. EXAM: LEFT GREAT TOE COMPARISON:  10/07/2017 FINDINGS: Degenerative changes in the first metatarsal-phalangeal joint. Soft tissue defect over the plantar aspect of the left first toe. There is underlying erosion of the distal phalangeal tuft cortex suggesting focal osteomyelitis. No evidence of acute fracture or dislocation. Diffuse bone demineralization. Prominent vascular calcifications. IMPRESSION: Soft  tissue defect over the plantar aspect of the left first toe with underlying erosion of the distal phalangeal tuft suggesting osteomyelitis. Electronically Signed   By: Lucienne Capers M.D.   On: 01/29/2018 21:30     CBC Recent Labs  Lab 01/29/18 1722 01/30/18 0627 01/31/18 0351  WBC 13.2* 16.7* 14.9*  HGB 13.2 11.9* 10.6*  HCT 41.8 39.0 35.1*  PLT 180 156 157  MCV 95.2 97.5 99.2  MCH 30.1 29.8 29.9  MCHC 31.6 30.5  30.2  RDW 18.7* 18.7* 18.9*    Chemistries  Recent Labs  Lab 01/29/18 1722 01/30/18 0627 01/30/18 1345  NA 135 135 139  K 4.9 6.1* 3.2*  CL 91* 93* 98  CO2 26 25 27   GLUCOSE 216* 195* 122*  BUN 93* 97* 35*  CREATININE 15.41* 16.71* 6.72*  CALCIUM 8.6* 8.2* 8.2*   ------------------------------------------------------------------------------------------------------------------ estimated creatinine clearance is 7.1 mL/min (A) (by C-G formula based on SCr of 6.72 mg/dL (H)). ------------------------------------------------------------------------------------------------------------------ No results for input(s): HGBA1C in the last 72 hours. ------------------------------------------------------------------------------------------------------------------ No results for input(s): CHOL, HDL, LDLCALC, TRIG, CHOLHDL, LDLDIRECT in the last 72 hours. ------------------------------------------------------------------------------------------------------------------ No results for input(s): TSH, T4TOTAL, T3FREE, THYROIDAB in the last 72 hours.  Invalid input(s): FREET3 ------------------------------------------------------------------------------------------------------------------ No results for input(s): VITAMINB12, FOLATE, FERRITIN, TIBC, IRON, RETICCTPCT in the last 72 hours.  Coagulation profile Recent Labs  Lab 01/29/18 2136  INR 1.14    No results for input(s): DDIMER in the last 72 hours.  Cardiac Enzymes No results for input(s): CKMB, TROPONINI, MYOGLOBIN in the last 168 hours.  Invalid input(s): CK ------------------------------------------------------------------------------------------------------------------ Invalid input(s): [place]  Patient admitted with sepsis   1.  Sepsis,  with MSSA with left great toe osteomyelitis.    Continue broad-spectrum antibiotics apreciate podiatry input plan for surgery .  Positive MSSA in the blood Acute  ischemia of the left foot status post angioplasty of left popliteal by vascular yesterday, continue heparin drip, continue antibiotics for left great toe osteomyelitis, patient still has gangrenous changes in the left great toe, significant pain, seen by podiatry, with the demarcation to see if the necrosis gets worse, patient is hopeful that it will get better with revascularization, podiatry to follow make further recommendation about possible left great MTP joint resection if no improvement.   2.    Acute encephalopathy due to #1 now improved, but still very cachectic and she has nausea. 3.  PAD.,  Left leg ischemia, status post hospitalization, continue aspirin, heparin drip, statins 4.  End-stage renal disease on peritoneal dialysis.    Nephrology has been notified, patient does not want peritoneal dialysis anymore, continue hemodialysis through right IJ permacath Monday, Wednesday, Friday.  5.  Diabetes type 2.  Continue to monitor blood sugars before meals and at bedtime and use insulin treatment during the hospital stay. 6. CHF, currently clinically compensated, continue maintenance therapy.  Decrease the dose of Coreg, Imdur because patient blood pressure is soft..  Midodrine as needed is ordered by nephrology. 7.  Peripheral neuropathy, continue pain medication. #8. nausea likely secondary to sepsis/narcotic induced: Patient has diabetes and liver tests with diabetic gastroparesis also.  Continue Zofran, add Reglan.  #.9. hyperkalemia: Potassium decreased from 6.1-3.2.     Code Status Orders  (From admission, onward)         Start     Ordered   01/29/18 2250  Full code  Continuous     01/29/18 2250        Code  Status History    Date Active Date Inactive Code Status Order ID Comments User Context   10/24/2016 1055 10/25/2016 2243 Full Code [id number]  Hillary Bow, MD ED   10/17/2016 2143 10/19/2016 1440 Full Code [id number]  Vaughan Basta, MD Inpatient   09/04/2016 1708  09/05/2016 1500 Full Code [id number]  Epifanio Lesches, MD ED   08/22/2016 0938 08/22/2016 1833 Full Code [id number]  Algernon Huxley, MD Inpatient   06/27/2016 1121 06/27/2016 1846 Full Code [id number]  Algernon Huxley, MD Inpatient   10/03/2015 2235 10/04/2015 1724 Full Code [id number]  Harrie Foreman, MD Inpatient           Consults  NEPHROLOGY AND VASCULAR SURGERY   HEPRIN  Lab Results  Component Value Date   PLT 157 01/31/2018     Time Spent in minutes 35MIN Greater than 50% of time spent in care coordination and counseling patient regarding the condition and plan of care.   Epifanio Lesches M.D on 01/31/2018 at 11:56 AM  Between 7am to 6pm - Pager - [phone]  After 6pm go to [email or web address] - Proofreader  [employer]   Office  [phone]

## 2018-01-31 NOTE — ED Provider Notes (Addendum)
   [facility] Department of Emergency Medicine   Code Blue CONSULT NOTE  Chief Complaint: Cardiac arrest/unresponsive   Level V Caveat: Unresponsive  History of present illness: I was contacted by the hospital for a CODE BLUE cardiac arrest upstairs and presented to the patient's bedside.   Responded to CODE BLUE.  On arrival patient with normal heart rate, hypertensive but not responding to stimuli, being bagged by RT.  Review of medical records demonstrates being treated for sepsis, history of encephalopathy which apparently improved today.  ROS: Unable to obtain, Level V caveat    Physical Exam  Gen: unresponsive  Resp: apneic. Breath sounds equal bilaterally with bagging  Abd: nondistended  Neuro: GCS 3, unresponsive to pain  HEENT: No blood in posterior pharynx, gag reflex absent  Neck: No crepitus  Musculoskeletal: No deformity  Skin: warm    CRITICAL CARE Performed by: Lavonia Drafts Total critical care time: 30 Critical care time was exclusive of separately billable procedures and treating other patients. Critical care was necessary to treat or prevent imminent or life-threatening deterioration. Critical care was time spent personally by me on the following activities: development of treatment plan with patient and/or surrogate as well as nursing, discussions with consultants, evaluation of patient's response to treatment, examination of patient, obtaining history from patient or surrogate, ordering and performing treatments and interventions, ordering and review of laboratory studies, ordering and review of radiographic studies, pulse oximetry and re-evaluation of patient's condition.  Cardiopulmonary Resuscitation (CPR) Procedure Note  Directed/Performed by: Lavonia Drafts I personally directed ancillary staff and/or performed CPR in an effort to regain return of spontaneous circulation and to maintain cardiac, neuro and systemic perfusion.     Medical Decision making  CPR in progress on my arrival, while he prepared for intubation I asked for pause in compressions, noticed a perfusing rhythm on the monitor, patient noted to have ROSC     Prepared for intubation given altered mental status but the patient became more alert, she vomited several times and then felt much better.  At that point turned over care to the hospitalist/intensive care service   Lavonia Drafts, MD 01/31/18 2035    Lavonia Drafts, MD 02/07/18 1521

## 2018-01-31 NOTE — Progress Notes (Signed)
[facility]  ROUNDING NOTE   Subjective:   [name] admitted to [facility] on 01/29/2018 for Peripheral vascular disease [facility]) [I73.9] Right leg pain [M79.604] Left leg pain [M79.605] Ulcer of great toe, left, with unspecified severity [facility]) [L97.529]   Patient underwent limb salvage procedure yesterday This morning, she reports pain which improves with medications Ate breakfast without nausea or vomiting  Objective:  Vital signs in last 24 hours:  Temp:  [98 F (36.7 C)-100.3 F (37.9 C)] 98.4 F (36.9 C) (10/12 0604) Pulse Rate:  [65-96] 65 (10/12 0924) Resp:  [10-47] 16 (10/12 0604) BP: (89-159)/(45-83) 110/45 (10/12 0924) SpO2:  [9 %-100 %] 93 % (10/12 0604)  Weight change:  Filed Weights   01/29/18 1605 01/30/18 0513  Weight: 59 kg 55.4 kg    Intake/Output: I/O last 3 completed shifts: In: 1046.1 [P.O.:60; I.V.:761.8; IV Piggyback:224.4] Out: -303    Intake/Output this shift:  No intake/output data recorded.  Physical Exam: General: NAD,   Head: Normocephalic, atraumatic. Moist oral mucosal membranes  Eyes: Anicteric   Neck: Supple,    Lungs:  Clear to auscultation  Heart: Regular rate and rhythm  Abdomen:  Soft, nontender,   Extremities:  left first toe with ischemic changes, discoloration on dorsal aspect of foot  Neurologic: Alert, oreinted  Access: PD catheter, RIJ permcath    Basic Metabolic Panel: Recent Labs  Lab 01/29/18 1722 01/30/18 0627 01/30/18 1345  NA 135 135 139  K 4.9 6.1* 3.2*  CL 91* 93* 98  CO2 26 25 27   GLUCOSE 216* 195* 122*  BUN 93* 97* 35*  CREATININE 15.41* 16.71* 6.72*  CALCIUM 8.6* 8.2* 8.2*    Liver Function Tests: No results for input(s): AST, ALT, ALKPHOS, BILITOT, PROT, ALBUMIN in the last 168 hours. No results for input(s): LIPASE, AMYLASE in the last 168 hours. No results for input(s): AMMONIA in the last 168 hours.  CBC: Recent Labs  Lab 01/29/18 1722 01/30/18 0627 01/31/18 0351  WBC  13.2* 16.7* 14.9*  HGB 13.2 11.9* 10.6*  HCT 41.8 39.0 35.1*  MCV 95.2 97.5 99.2  PLT 180 156 157    Cardiac Enzymes: No results for input(s): CKTOTAL, CKMB, CKMBINDEX, TROPONINI in the last 168 hours.  BNP: Invalid input(s): POCBNP  CBG: Recent Labs  Lab 01/29/18 2321 01/30/18 0130 01/30/18 0737 01/30/18 2044 01/31/18 0724  GLUCAP 219* 196* 159* 93 91    Microbiology: Results for orders placed or performed during the hospital encounter of 01/29/18  Blood Culture (routine x 2)     Status: Abnormal (Preliminary result)   Collection Time: 01/29/18  9:55 PM  Result Value Ref Range Status   Specimen Description   Final    BLOOD RIGHT ANTECUBITAL Performed at [facility], [address] [place], [place] [address]    Special Requests   Final    BOTTLES DRAWN AEROBIC AND ANAEROBIC Blood Culture adequate volume Performed at [facility], [address] [place], [place] [address]    Culture  Setup Time   Final    GRAM POSITIVE COCCI IN CLUSTERS IN BOTH AEROBIC AND ANAEROBIC BOTTLES CRITICAL RESULT CALLED TO, READ BACK BY AND VERIFIED WITH: KAREN HAYES 01/30/18 0839 JGF Performed at [facility], [address] [place], [place] [address]    Culture STAPHYLOCOCCUS AUREUS (A)  Final   Report Status PENDING  Incomplete  Blood Culture (routine x 2)     Status: Abnormal (Preliminary result)   Collection Time: 01/29/18  9:55 PM  Result Value Ref Range Status   Specimen Description   Final    BLOOD RIGHT ANTECUBITAL Performed at [facility], [address], [place], [place] [address]    Special Requests   Final    BOTTLES DRAWN AEROBIC AND ANAEROBIC Blood Culture adequate volume Performed at [facility], [address] [place], [place] [address]    Culture  Setup Time   Final    Organism ID to follow GRAM POSITIVE COCCI IN CLUSTERS IN BOTH AEROBIC AND ANAEROBIC BOTTLES CRITICAL RESULT CALLED TO, READ BACK BY AND  VERIFIED WITH: KAREN HAYES 01/30/18 0839 JGF Performed at [facility], [address], [place], [place] [address]    Culture (A)  Final    STAPHYLOCOCCUS AUREUS SUSCEPTIBILITIES TO FOLLOW Performed at [facility], [place] [address] [place], [place] [address]    Report Status PENDING  Incomplete  Blood Culture ID Panel (Reflexed)     Status: Abnormal   Collection Time: 01/29/18  9:55 PM  Result Value Ref Range Status   Enterococcus species NOT DETECTED NOT DETECTED Final   Vancomycin resistance NOT DETECTED NOT DETECTED Final   Listeria monocytogenes NOT DETECTED NOT DETECTED Final   Staphylococcus species DETECTED (A) NOT DETECTED Final    Comment: CRITICAL RESULT CALLED TO, READ BACK BY AND VERIFIED WITH: KAREN HAYES 01/30/18 0839 JGF    Staphylococcus aureus (BCID) DETECTED (A) NOT DETECTED Final    Comment: CRITICAL RESULT CALLED TO, READ BACK BY AND VERIFIED WITH: KAREN HAYES 01/30/18 0839 JGF Methicillin (oxacillin) susceptible Staphylococcus aureus (MSSA). Preferred therapy is anti staphylococcal beta lactam antibiotic (Cefazolin or Nafcillin), unless clinically contraindicated.    Methicillin resistance NOT DETECTED NOT DETECTED Final   Streptococcus species NOT DETECTED NOT DETECTED Final   Streptococcus agalactiae NOT DETECTED NOT DETECTED Final   Streptococcus pneumoniae NOT DETECTED NOT DETECTED Final   Streptococcus pyogenes NOT DETECTED NOT DETECTED Final   Acinetobacter baumannii NOT DETECTED NOT DETECTED Final   Enterobacteriaceae species NOT DETECTED NOT DETECTED Final   Enterobacter cloacae complex NOT DETECTED NOT DETECTED Final   Escherichia coli NOT DETECTED NOT DETECTED Final   Klebsiella oxytoca NOT DETECTED NOT DETECTED Final   Klebsiella pneumoniae NOT DETECTED NOT DETECTED Final   Proteus species NOT DETECTED NOT DETECTED Final   Serratia marcescens NOT DETECTED NOT DETECTED Final   Carbapenem resistance NOT DETECTED NOT DETECTED Final    Haemophilus influenzae NOT DETECTED NOT DETECTED Final   Neisseria meningitidis NOT DETECTED NOT DETECTED Final   Pseudomonas aeruginosa NOT DETECTED NOT DETECTED Final   Candida albicans NOT DETECTED NOT DETECTED Final   Candida glabrata NOT DETECTED NOT DETECTED Final   Candida krusei NOT DETECTED NOT DETECTED Final   Candida parapsilosis NOT DETECTED NOT DETECTED Final   Candida tropicalis NOT DETECTED NOT DETECTED Final    Comment: Performed at [facility], [address] [place], [place] [address]  MRSA PCR Screening     Status: None   Collection Time: 01/29/18 10:55 PM  Result Value Ref Range Status   MRSA by PCR NEGATIVE NEGATIVE Final    Comment:        The GeneXpert MRSA Assay (FDA approved for NASAL specimens only), is one component of a comprehensive MRSA colonization surveillance program. It is not intended to diagnose MRSA infection nor to guide or monitor treatment for MRSA infections. Performed at [facility], [address], [place], [place] [address]   Culture, blood (  Routine X 2) w Reflex to ID Panel     Status: None (Preliminary result)   Collection Time: 01/30/18  9:27 AM  Result Value Ref Range Status   Specimen Description BLOOD RIGHT ANTECUBITAL  Final   Special Requests   Final    BOTTLES DRAWN AEROBIC AND ANAEROBIC Blood Culture adequate volume   Culture   Final    NO GROWTH < 24 HOURS Performed at [facility], [address] [place], [place] [address]    Report Status PENDING  Incomplete    Coagulation Studies: Recent Labs    01/29/18 2134-07-02  LABPROT 14.5  INR 1.14    Urinalysis: No results for input(s): COLORURINE, LABSPEC, PHURINE, GLUCOSEU, HGBUR, BILIRUBINUR, KETONESUR, PROTEINUR, UROBILINOGEN, NITRITE, LEUKOCYTESUR in the last 72 hours.  Invalid input(s): APPERANCEUR    Imaging: Dg Tibia/fibula Left  Result Date: 01/29/2018 CLINICAL DATA:  Presents with ED via ems from home Having pain to both  lower legs But states left leg is worse Increased pain with palpation Decreased pulses to foot EXAM: LEFT TIBIA AND FIBULA - 2 VIEW COMPARISON:  None. FINDINGS: No fracture.  No bone lesion. Knee and ankle joints are normally aligned. There are arterial vascular calcifications. Soft tissues are otherwise unremarkable. IMPRESSION: Negative Electronically Signed   By: Lajean Manes M.D.   On: 01/29/2018 20:58   US Venous Img Lower Unilateral Left  Result Date: 01/29/2018 CLINICAL DATA:  Left leg pain EXAM: LEFT LOWER EXTREMITY VENOUS DOPPLER ULTRASOUND TECHNIQUE: Gray-scale sonography with graded compression, as well as color Doppler and duplex ultrasound were performed to evaluate the lower extremity deep venous systems from the level of the common femoral vein and including the common femoral, femoral, profunda femoral, popliteal and calf veins including the posterior tibial, peroneal and gastrocnemius veins when visible. The superficial great saphenous vein was also interrogated. Spectral Doppler was utilized to evaluate flow at rest and with distal augmentation maneuvers in the common femoral, femoral and popliteal veins. COMPARISON:  None. FINDINGS: Contralateral Common Femoral Vein: Respiratory phasicity is normal and symmetric with the symptomatic side. No evidence of thrombus. Normal compressibility. Common Femoral Vein: No evidence of thrombus. Normal compressibility, respiratory phasicity and response to augmentation. Saphenofemoral Junction: No evidence of thrombus. Normal compressibility and flow on color Doppler imaging. Profunda Femoral Vein: No evidence of thrombus. Normal compressibility and flow on color Doppler imaging. Femoral Vein: No evidence of thrombus. Normal compressibility, respiratory phasicity and response to augmentation. Popliteal Vein: No evidence of thrombus. Normal compressibility, respiratory phasicity and response to augmentation. Calf Veins: No evidence of thrombus. Normal  compressibility and flow on color Doppler imaging. Superficial Great Saphenous Vein: No evidence of thrombus. Normal compressibility. Venous Reflux:  None. Other Findings:  None. IMPRESSION: No evidence of deep venous thrombosis. Electronically Signed   By: Rolm Baptise M.D.   On: 01/29/2018 18:36   Dg Toe Great Left  Result Date: 01/29/2018 CLINICAL DATA:  Open wound to the left great toe for several months. EXAM: LEFT GREAT TOE COMPARISON:  10/07/2017 FINDINGS: Degenerative changes in the first metatarsal-phalangeal joint. Soft tissue defect over the plantar aspect of the left first toe. There is underlying erosion of the distal phalangeal tuft cortex suggesting focal osteomyelitis. No evidence of acute fracture or dislocation. Diffuse bone demineralization. Prominent vascular calcifications. IMPRESSION: Soft tissue defect over the plantar aspect of the left first toe with underlying erosion of the distal phalangeal tuft suggesting osteomyelitis. Electronically Signed   By: Oren Beckmann.D.  On: 01/29/2018 21:30     Medications:   . sodium chloride 10 mL/hr at 01/30/18 1532  .  ceFAZolin (ANCEF) IV 500 mg (01/31/18 0939)  . heparin 800 Units/hr (01/31/18 0500)   . aspirin EC  81 mg Oral Daily  . atorvastatin  80 mg Oral QHS  . Atropine Sulfate-NaCl  1 drop Ophthalmic BID  . carvedilol  6.25 mg Oral BID WC  . Chlorhexidine Gluconate Cloth  6 each Topical Q0600  . cholecalciferol  400 Units Oral Once per day on Mon Wed Fri  . docusate sodium  100 mg Oral BID  . famotidine  20 mg Oral Daily  . feeding supplement (NEPRO CARB STEADY)  237 mL Oral BID BM  . insulin aspart  0-5 Units Subcutaneous QHS  . insulin aspart  0-9 Units Subcutaneous TID WC  . irbesartan  75 mg Oral Daily  . isosorbide mononitrate  60 mg Oral Daily  . multivitamin  1 tablet Oral QHS  . multivitamin with minerals  1 tablet Oral Daily  . multivitamin-lutein  1 capsule Oral Daily  . sevelamer carbonate  800 mg  Oral TID WC   acetaminophen **OR** acetaminophen, bisacodyl, morphine injection, nitroGLYCERIN, ondansetron **OR** ondansetron (ZOFRAN) IV, oxyCODONE-acetaminophen, traZODone  Assessment/ Plan:  [name] is a 67 y.o. black female with end stage renal disease on peritoneal dialysis, hypertension, congestive heart failure, coronary artery disease status post CABG, diabetes mellitus type II insulin dependent, GERD, peripheral vascular disease admitted to [facility] on 01/29/2018 for Peripheral vascular disease ([place]) [I73.9] Right leg pain [M79.604] Left leg pain [M79.605] Ulcer of great toe, left, with unspecified severity [facility]) [facility] CCPD 10 hours 5 exchanges 2 liter fills   1. End Stage Renal Disease with hyperkalemia: missed several days of peritoneal dialysis.  Underwent HD yesterday. Does not want to do PD anymore as outpatient - will continue Hemodialysis through [facility] - May consider restarting peritoneal dialysis tonight.   2. Peripheral vascular disease: left first toe with gangrene and pain.  - underwent limb salvage angioplasty on 01/31/11  3. Hypotension:  - midodrine PRN ordered - hold carvedilol  4. Anemia of chronic kidney disease: hemoglobin 10.6.  EPO with HD  5. Secondary Hyperparathyroidism:  - sevelamer with meals.   6. Diabetes mellitus type II with chronic kidney disease: insulin dependent -  Continue glucose control     LOS: 2 Lissie Hinesley 10/12/201911:15 AM

## 2018-01-31 NOTE — Progress Notes (Signed)
Primary nurse received report from off going RN with all questions answered. While receiving report about the Pt, the pt  started to become somewhat nauseas. Primary nurse finished receiving report on rest of pts and was going to precede to give pt Zofran as ordered. When entering the pt room, the pt was found to be unresponsive. Pt did maintain a weak pulse. Rapid Response was initially called and soon there after A CODE BLUE was called. Prime Dr. Curly Rim multiple times with no call back. Dr. Corky Downs responded to Code [facility] from the ED. After a short period of time pt did reagin consciousness and had several episode of emesis. Supervisor along with ICU charge nurse did respond to Rapid Response and Code Blue. NP Hinton Dyer  from CCU responded in regard to pt need to possible transfer pt to CCU. Pt stated "I am tired" "I want to be comfortable" Pt code status was discussed with pt. Pt daughters were called and arrived later along with multiple family members. Primary nurse notified NP that family was here. NP informed primary nurse to page and speak to prime Dr. Darl Householder  Nurse paged and spoke  with Dr. Duane Boston in regard to the events of the evening regarding  pts Rapid response, Code Blue  along with the pts request to be a DNR along with comfort care only and that the pts two daughter were here and would like to speak with a Dr.  Dr. Duane Boston stated that " I will just put the order in if that's her request and I don't have two hours to waste answering questions'. Dr. Duane Boston stated that she had pts in the ED and I told her I understand. After the conversation with Dr. Duane Boston primary nurse contacted NP Hinton Dyer from CCU to  update her. NP Hinton Dyer informed Primary nurse   that Dr. Duane Boston needs to speak with the pt and family regarding the request of the pt and family. NP Hinton Dyer stated that she would call Dr. Duane Boston.  Dr. Duane Boston called back and spoke with primary nurse and also with Pt Daughter over the phone. Order was placed for DNR along with a  consult for [facility]. Primary nurse to continue to monitor pt.

## 2018-01-31 NOTE — Progress Notes (Addendum)
Contacted by nursing supervisor and ICU charge nurse to assess pt for potential transfer to ICU.  Code Blue initiated prior to my arrival at pts bedside according to respiratory therapist and pts nurse Remo Lipps the pt was unresponsive with a respiratory rate of 0, however she never lost her pulse or required CPR.  ER physician arrived at bedside prior to my arrival in preparation to intubate pt, however the pt became alert and oriented spontaneously.  Upon my arrival the pt remained alert and oriented vss on 6L O2 via nasal canula. She proceeded to tell me "I am tired and ready to go I want to be comfortable" I discussed code status with the pt and she stated she wanted her code status changed to DO NOT RESUSCITATE and no longer wanted treatment with multiple witnesses at pts bedside. I also spoke with pts sister who arrived at pts bedside and informed her of her sisters wishes.  I spoke with pts daughter Conchita Paris via telephone and informed her of change in pts condition and her mothers wishes.  I told Mrs. White I would honor her mothers wishes because the pt is alert and oriented and change her code status to DNR.  She stated she understood and would contact her sisters [name] and Leroy Sea to inform them of their mothers wishes.  I spoke with the pt again and she confirmed code status change to DNR.  Will hold off on transition to comfort care only until her daughters arrive at pts bedside.  I spoke with hospitalist Dr. Brett Albino to inform her of plan of care and pt does not require transfer to ICU.  Marda Stalker, [place] Pager [phone] (please enter 7 digits) PCCM Consult Pager [phone] (please enter 7 digits)

## 2018-01-31 NOTE — Progress Notes (Signed)
PT Cancellation Note  Patient Details Name: Stephanie Ellis MRN: [id number] DOB: 18-Sep-1950   Cancelled Treatment:    Reason Eval/Treat Not Completed: Pain limiting ability to participate;Medical issues which prohibited therapy;Other (comment).  Pt was in severe pain in the AM and later was having nausea.  Noted condition of L great toe, pt unable to tolerate touch on L foot.  Will try again at another time.   Ramond Dial 01/31/2018, 12:51 PM   Mee Hives, PT MS Acute Rehab Dept. Number: [place] [facility] [place]

## 2018-01-31 NOTE — Progress Notes (Signed)
Pt pressure assisted device lost all suction and adhesive loose on three sides. Pt also complaining of pain in left leg. Primary nurse paged and spoke to [facility] and orders received to replace Pressure assisted Device along with Percocet and Morphine for pain. Primary nurse to continue to monitor.

## 2018-01-31 NOTE — Progress Notes (Signed)
I discussed with patient's family in detail about patient's condition and further management versus withdrawal of care. Patient herself, at this time seems to be tired of treatment and inclined to request withdrawal of care.  However, the family would like to take some time and further discuss this among the p.m. and with the patient again.  We will continue same treatment for now, but avoid aggressive procedures.  Palliative care team is consulted.

## 2018-01-31 NOTE — Progress Notes (Signed)
Marland Kitchen                                                                                                                           [facility]                                                      Patient Demographics  Stephanie Ellis, is a 67 y.o. female   MRN: [id number]   DOB - [birth year]  Admit Date - 01/29/2018    Outpatient Primary MD for the patient is System, Pcp Not In  Consult requested in the Hospital by Epifanio Lesches, MD, On 01/31/2018    With History of -  Past Medical History:  Diagnosis Date  . Anemia   . Anorexia   . Bacteremia due to Pseudomonas   . CHF (congestive heart failure) (HCC)    has not happened recently  . Complication of anesthesia 01/2016   WITH LAST STENT 10/17. PASsED OUT AND HAD TO BE AWAKENED  . Coronary artery disease involving left main coronary artery 01/2015   [facility]: 70% LM, p-mLAD 50-60% (Resting FFR 0.75), mRCA 80-90%, ~40 Ost OM & D1  . Dysrhythmia   . ESRD (end stage renal disease) on dialysis [facility])    ESRD secondary to acute kidney failure s/p CABG  DIALYSIS M/W/F  . Essential hypertension   . GERD (gastroesophageal reflux disease)   . Heart murmur    no treatment  . Hypercholesterolemia   . Myocardial infarction ([place]) 2016  . 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 Marland Kitchen Haithcock: CABG x 3, LIMA to LAD, SVG to RCA, SVG to OM3, EVH  . Sinusitis 2019  . Type II diabetes mellitus with complication (HCC)    CAD      Past Surgical History:  Procedure Laterality Date  . ABDOMINAL HYSTERECTOMY    . ARTERIOVENOUS GRAFT PLACEMENT  05/2016  . AV FISTULA PLACEMENT Left 05/30/2016   Procedure: ARTERIOVENOUS graft;  Surgeon: Algernon Huxley, MD;  Location: [facility] ORS;  Service: Vascular;  Laterality: Left;  . CAPD INSERTION N/A 10/30/2017   Procedure: LAPAROSCOPIC INSERTION CONTINUOUS AMBULATORY PERITONEAL DIALYSIS  (CAPD) CATHETER;  Surgeon: Algernon Huxley, MD;  Location: [facility] ORS;  Service: Vascular;  Laterality: N/A;  . CARDIAC  CATHETERIZATION  01/2015   [facility]: Ost LM 70%, p-m LAD 50-60% (Rest FFR + @ 0.75), mRCA 80-90%, ostD1 40%, pOM1 40%  . CATARACT EXTRACTION W/PHACO Left 01/18/2016   Procedure: CATARACT EXTRACTION PHACO AND INTRAOCULAR LENS PLACEMENT (IOC);  Surgeon: Eulogio Bear, MD;  Location: [facility] ORS;  Service: Ophthalmology;  Laterality: Left;  [place] 1.05 AP% 15.5 CDE 10.16 Fluid Pack Lot # [id number]  . CATARACT EXTRACTION W/PHACO Right 08/01/2016   Procedure: CATARACT EXTRACTION PHACO AND INTRAOCULAR LENS PLACEMENT (IOC);  Surgeon: Eulogio Bear, MD;  Location: [facility] ORS;  Service: Ophthalmology;  Laterality: Right;  [place] 01:00.6 AP% 11.4 CDE 6.93  LOT # [id number]  . COLONOSCOPY    . CORONARY ANGIOPLASTY     SENTS 02/12/16  . CORONARY ARTERY BYPASS GRAFT  03/28/15    [facility]: Dr. Waldemar Dickens: LIMA to LAD, SVG to RCA, SVG to OM3, EVH  . ESOPHAGOGASTRODUODENOSCOPY (EGD) WITH PROPOFOL N/A 10/24/2016   Procedure: ESOPHAGOGASTRODUODENOSCOPY (EGD) WITH PROPOFOL;  Surgeon: [name], MD;  Location: [facility] ENDOSCOPY;  Service: Endoscopy;  Laterality: N/A;  . EYE SURGERY Bilateral    cataract surgery  . EYE SURGERY     drains for glaucoma  . INSERTION EXPRESS TUBE SHUNT Right 08/01/2016   Procedure: INSERTION EXPRESS TUBE SHUNT;  Surgeon: Eulogio Bear, MD;  Location: [facility] ORS;  Service: Ophthalmology;  Laterality: Right;  . INSERTION OF AHMED VALVE Left 08/15/2016   Procedure: INSERTION OF AHMED VALVE;  Surgeon: Eulogio Bear, MD;  Location: [facility] ORS;  Service: Ophthalmology;  Laterality: Left;  . INSERTION OF DIALYSIS CATHETER    . TRANSTHORACIC ECHOCARDIOGRAM  January 2017    EF 60-65%. GR 2 DD. Mild degenerative mitral valve disease but no prolapse or regurgitation. Mild left atrial dilation. Mild to moderate LVH. Pericardial effusion gone  . UPPER EXTREMITY ANGIOGRAPHY Left 06/27/2016   Procedure: Upper Extremity Angiography;  Surgeon: Algernon Huxley, MD;  Location: [place] CV LAB;  Service:  Cardiovascular;  Laterality: Left;  . UPPER EXTREMITY ANGIOGRAPHY Left 08/22/2016   Procedure: Upper Extremity Angiography;  Surgeon: Algernon Huxley, MD;  Location: [place] CV LAB;  Service: Cardiovascular;  Laterality: Left;    in for   Chief Complaint  Patient presents with  . Leg Pain     HPI  Stephanie Ellis  is a 67 y.o. female, patient was seen 2 days ago as a consult from vascular take a look at the gangrenous toe.  That time I as well as the vascular surgeon felt like she had a cold limb and needed intervention as soon as possible.  Fortunately she is able to have angioplasty yesterday which according Dr. Nino Parsley note helped to establish better flow to the lower extremity.    Review of Systems: She is alert well oriented states she still has good bit of pain with the foot.  In addition to the HPI above,  No Fever-chills, No Headache, No changes with Vision or hearing, No problems swallowing food or Liquids, No Chest pain, Cough or Shortness of Breath, No Abdominal pain, No Nausea or Vommitting, Bowel movements are regular, No Blood in stool or Urine, No dysuria, No new skin rashes or bruises, No new joints pains-aches,  No new weakness, tingling, numbness in any extremity, No recent weight gain or loss, No polyuria, polydypsia or polyphagia, No significant Mental Stressors.  A full 10 point Review of Systems was done, except as stated above, all other Review of Systems were negative.   Social History Social History   Tobacco Use  . Smoking status: Never Smoker  . Smokeless tobacco: Never Used  Substance Use Topics  . Alcohol use: No    Family History Family History  Problem Relation Age of Onset  . Diabetes Mellitus II Mother   . Pancreatic cancer Father     Prior to Admission medications   Medication Sig Start Date End Date Taking? Authorizing Provider  aspirin EC 81 MG tablet Take 1 tablet (81 mg total) by mouth daily. 10/19/16  Yes Dustin Flock,  MD  atorvastatin (LIPITOR) 80 MG tablet Take 80 mg by mouth at bedtime.  05/05/17  Yes [provider]  calcitRIOL (ROCALTROL) 0.5 MCG capsule Take 0.5 mcg by mouth daily.   Yes [provider]  carvedilol (COREG) 3.125 MG tablet Take 3.125 mg by mouth 2 (two) times daily with a meal.  03/06/17 03/01/18 Yes [provider]  Cholecalciferol 1000 units tablet Take 1 tablet by mouth daily. Monday, Wednesday and Friday at dialysis   Yes [provider]  insulin glargine (LANTUS) 100 unit/mL SOPN Inject 14 Units into the skin at bedtime.    Yes [provider]  isosorbide mononitrate (IMDUR) 30 MG 24 hr tablet Take 30 mg by mouth daily.    Yes [provider]  mirtazapine (REMERON) 7.5 MG tablet Take 7.5 mg by mouth at bedtime.   Yes [provider]  Multiple Vitamins-Minerals (PRORENAL + D) TABS Take 1 tablet by mouth daily.   Yes [provider]  nitroGLYCERIN (NITROSTAT) 0.4 MG SL tablet Place 1 tablet (0.4 mg total) under the tongue every 5 (five) minutes as needed for chest pain. 09/05/16  Yes Wieting, Richard, MD  Omega-3 300 MG CAPS Take 2 capsules by mouth daily.   Yes [provider]  valsartan (DIOVAN) 80 MG tablet Take 80 mg by mouth 2 (two) times daily.  07/16/17  Yes [provider]  ATROPINE SULFATE-NACL OP Apply 1 drop to eye 2 (two) times daily. Left eye only    [provider]  gentamicin cream (GARAMYCIN) 0.1 % APPLY TO EXIT SITE DAILY 11/10/17   [provider]  HYDROcodone-acetaminophen (NORCO) 5-325 MG tablet Take 1 tablet by mouth every 6 (six) hours as needed for moderate pain. Patient not taking: Reported on 01/29/2018 10/30/17   Algernon Huxley, MD  lisinopril (PRINIVIL,ZESTRIL) 10 MG tablet Take 10 mg by mouth daily. 11/04/16 11/04/17  [provider]  metoCLOPramide (REGLAN) 10 MG tablet Take 1 tablet (10 mg total) by mouth every 6 (six) hours as needed for nausea,  vomiting or refractory nausea / vomiting. Patient not taking: Reported on 01/29/2018 10/25/16   Dustin Flock, MD  metoprolol tartrate (LOPRESSOR) 25 MG tablet Take 25 mg by mouth 2 (two) times daily.     [provider]  Multiple Vitamin (MULTIVITAMIN WITH MINERALS) TABS tablet Take 2 tablets by mouth daily. Gummy vitamins    [provider]  ondansetron (ZOFRAN) 4 MG tablet Take 4 mg by mouth every 8 (eight) hours as needed for nausea or vomiting.    [provider]  polyvinyl alcohol (LIQUIFILM TEARS) 1.4 % ophthalmic solution Place 1 drop into both eyes 2 (two) times daily.     [provider]  predniSONE (DELTASONE) 10 MG tablet Take 10 mg by mouth daily with breakfast. taper    [provider]  ranitidine (ZANTAC) 300 MG tablet Take 1 tablet (300 mg total) by mouth at bedtime. Patient not taking: Reported on 01/29/2018 09/27/17   Darel Hong, MD  sevelamer carbonate (RENVELA) 800 MG tablet Take 800 mg by mouth 3 (three) times daily with meals.    [provider]    Anti-infectives (From admission, onward)   Start     Dose/Rate Route Frequency Ordered Stop   01/30/18 1800  vancomycin (VANCOCIN) IVPB 1000 mg/200 mL premix  Status:  Discontinued     1,000 mg 200 mL/hr over 60 Minutes Intravenous Every M-W-F (Hemodialysis) 01/30/18 7672 01/30/18 0947  01/30/18 1000  piperacillin-tazobactam (ZOSYN) IVPB 3.375 g  Status:  Discontinued     3.375 g 12.5 mL/hr over 240 Minutes Intravenous Every 12 hours 01/30/18 0435 01/30/18 0906   01/30/18 1000  ceFAZolin (ANCEF) 500 mg in dextrose 5 % 100 mL IVPB     500 mg 210 mL/hr over 30 Minutes Intravenous Every 12 hours 01/30/18 0906     01/29/18 2200  piperacillin-tazobactam (ZOSYN) IVPB 3.375 g     3.375 g 100 mL/hr over 30 Minutes Intravenous  Once 01/29/18 2146 01/29/18 2242   01/29/18 2200  vancomycin (VANCOCIN) IVPB 1000 mg/200 mL premix     1,000 mg 200 mL/hr over 60 Minutes Intravenous   Once 01/29/18 2146 01/29/18 2314      Scheduled Meds: . aspirin EC  81 mg Oral Daily  . atorvastatin  80 mg Oral QHS  . Atropine Sulfate-NaCl  1 drop Ophthalmic BID  . carvedilol  6.25 mg Oral BID WC  . Chlorhexidine Gluconate Cloth  6 each Topical Q0600  . cholecalciferol  400 Units Oral Once per day on Mon Wed Fri  . docusate sodium  100 mg Oral BID  . famotidine  20 mg Oral Daily  . feeding supplement (NEPRO CARB STEADY)  237 mL Oral BID BM  . insulin aspart  0-5 Units Subcutaneous QHS  . insulin aspart  0-9 Units Subcutaneous TID WC  . irbesartan  75 mg Oral Daily  . isosorbide mononitrate  60 mg Oral Daily  . multivitamin  1 tablet Oral QHS  . multivitamin with minerals  1 tablet Oral Daily  . multivitamin-lutein  1 capsule Oral Daily  . sevelamer carbonate  800 mg Oral TID WC   Continuous Infusions: . sodium chloride 10 mL/hr at 01/30/18 1532  .  ceFAZolin (ANCEF) IV 500 mg (01/31/18 0939)  . heparin 800 Units/hr (01/31/18 0500)   PRN Meds:.acetaminophen **OR** acetaminophen, bisacodyl, morphine injection, nitroGLYCERIN, ondansetron **OR** ondansetron (ZOFRAN) IV, oxyCODONE-acetaminophen, traZODone  Allergies  Allergen Reactions  . Chlorthalidone Anaphylaxis, Itching and Rash  . Fentanyl Rash  . Midazolam Rash  . Ace Inhibitors Other (See Comments)    Reaction:  Hyperkalemia, agitation   . Angiotensin Receptor Blockers Other (See Comments)    Reaction:  Hyperkalemia, agitation   . Norvasc [Amlodipine] Itching and Rash  . Phenergan [Promethazine Hcl] Anxiety    "antsy, can't sit still"    Physical Exam  Vitals  Blood pressure (!) 110/45, pulse 65, temperature 98.4 F (36.9 C), temperature source Oral, resp. rate 16, height 5' 4"  (1.626 m), weight 55.4 kg, SpO2 93 %.  Lower Extremity exam: Her left leg is much warmer than it was 2 days ago.  It feels much more comfortable in the color to her toes is much better with the exception of the left great toe which is  still dark and likely will become gangrenous.  There is a few areas of discoloration on the top of the foot as well that I will hope will reestablish better color and continue to improve.  Does not appear to have any infection in the foot at this timeframe.  Data Review  CBC Recent Labs  Lab 01/29/18 1722 01/30/18 0627 01/31/18 0351  WBC 13.2* 16.7* 14.9*  HGB 13.2 11.9* 10.6*  HCT 41.8 39.0 35.1*  PLT 180 156 157  MCV 95.2 97.5 99.2  MCH 30.1 29.8 29.9  MCHC 31.6 30.5 30.2  RDW 18.7* 18.7* 18.9*   ------------------------------------------------------------------------------------------------------------------  Chemistries  Recent Labs  Lab 01/29/18 1722 01/30/18 0627 01/30/18 1345  NA 135 135 139  K 4.9 6.1* 3.2*  CL 91* 93* 98  CO2 26 25 27   GLUCOSE 216* 195* 122*  BUN 93* 97* 35*  CREATININE 15.41* 16.71* 6.72*  CALCIUM 8.6* 8.2* 8.2*   -------------Urinalysis    Component Value Date/Time   COLORURINE RED (A) 10/17/2016 1643   APPEARANCEUR TURBID (A) 10/17/2016 1643   LABSPEC 1.014 10/17/2016 1643   PHURINE  10/17/2016 1643    TEST NOT REPORTED DUE TO COLOR INTERFERENCE OF URINE PIGMENT   GLUCOSEU (A) 10/17/2016 1643    TEST NOT REPORTED DUE TO COLOR INTERFERENCE OF URINE PIGMENT   HGBUR (A) 10/17/2016 1643    TEST NOT REPORTED DUE TO COLOR INTERFERENCE OF URINE PIGMENT   BILIRUBINUR (A) 10/17/2016 1643    TEST NOT REPORTED DUE TO COLOR INTERFERENCE OF URINE PIGMENT   KETONESUR (A) 10/17/2016 1643    TEST NOT REPORTED DUE TO COLOR INTERFERENCE OF URINE PIGMENT   PROTEINUR (A) 10/17/2016 1643    TEST NOT REPORTED DUE TO COLOR INTERFERENCE OF URINE PIGMENT   NITRITE (A) 10/17/2016 1643    TEST NOT REPORTED DUE TO COLOR INTERFERENCE OF URINE PIGMENT   LEUKOCYTESUR (A) 10/17/2016 1643    TEST NOT REPORTED DUE TO COLOR INTERFERENCE OF URINE PIGMENT     Imaging results:   Dg Tibia/fibula Left  Result Date: 01/29/2018 CLINICAL DATA:  Presents with ED via  ems from home Having pain to both lower legs But states left leg is worse Increased pain with palpation Decreased pulses to foot EXAM: LEFT TIBIA AND FIBULA - 2 VIEW COMPARISON:  None. FINDINGS: No fracture.  No bone lesion. Knee and ankle joints are normally aligned. There are arterial vascular calcifications. Soft tissues are otherwise unremarkable. IMPRESSION: Negative Electronically Signed   By: Lajean Manes M.D.   On: 01/29/2018 20:58   US Venous Img Lower Unilateral Left  Result Date: 01/29/2018 CLINICAL DATA:  Left leg pain EXAM: LEFT LOWER EXTREMITY VENOUS DOPPLER ULTRASOUND TECHNIQUE: Gray-scale sonography with graded compression, as well as color Doppler and duplex ultrasound were performed to evaluate the lower extremity deep venous systems from the level of the common femoral vein and including the common femoral, femoral, profunda femoral, popliteal and calf veins including the posterior tibial, peroneal and gastrocnemius veins when visible. The superficial great saphenous vein was also interrogated. Spectral Doppler was utilized to evaluate flow at rest and with distal augmentation maneuvers in the common femoral, femoral and popliteal veins. COMPARISON:  None. FINDINGS: Contralateral Common Femoral Vein: Respiratory phasicity is normal and symmetric with the symptomatic side. No evidence of thrombus. Normal compressibility. Common Femoral Vein: No evidence of thrombus. Normal compressibility, respiratory phasicity and response to augmentation. Saphenofemoral Junction: No evidence of thrombus. Normal compressibility and flow on color Doppler imaging. Profunda Femoral Vein: No evidence of thrombus. Normal compressibility and flow on color Doppler imaging. Femoral Vein: No evidence of thrombus. Normal compressibility, respiratory phasicity and response to augmentation. Popliteal Vein: No evidence of thrombus. Normal compressibility, respiratory phasicity and response to augmentation. Calf Veins: No  evidence of thrombus. Normal compressibility and flow on color Doppler imaging. Superficial Great Saphenous Vein: No evidence of thrombus. Normal compressibility. Venous Reflux:  None. Other Findings:  None. IMPRESSION: No evidence of deep venous thrombosis. Electronically Signed   By: Rolm Baptise M.D.   On: 01/29/2018 18:36   Dg Toe Great Left  Result Date: 01/29/2018 CLINICAL DATA:  Open wound to  the left great toe for several months. EXAM: LEFT GREAT TOE COMPARISON:  10/07/2017 FINDINGS: Degenerative changes in the first metatarsal-phalangeal joint. Soft tissue defect over the plantar aspect of the left first toe. There is underlying erosion of the distal phalangeal tuft cortex suggesting focal osteomyelitis. No evidence of acute fracture or dislocation. Diffuse bone demineralization. Prominent vascular calcifications. IMPRESSION: Soft tissue defect over the plantar aspect of the left first toe with underlying erosion of the distal phalangeal tuft suggesting osteomyelitis. Electronically Signed   By: Lucienne Capers M.D.   On: 01/29/2018 21:30    Assessment & Plan: The patient had angioplasty yesterday with extensive revascularization to lower extremity.  On exam today there is still likely gangrenous changes to the great toe.  There is also some discoloration extending proximally on the foot.  She still also having a significant pain in the area as well.  I think we need to watch it for a little while and see what a line of demarcation is going to be as far as necrosis is concerned I do think that she will need amputation of the great toe at a minimum likely to the MTP joint region at a minimum.  She is currently not in favor of this and thinks that the toes going to heal and get better because she thinks positive.  I told her we would watch it but ultimately it is likely going to have to be removed.  As well as see if things improve from a viability standpoint and also to what level she gets  stabilization of her tissues.  I will follow her again tomorrow.  Active Problems:   Sepsis [facility])   Family Communication: Plan discussed with patient   Albertine Patricia M.D on 01/31/2018 at 10:16 AM  Thank you for the consult, we will follow the patient with you in the Hospital.

## 2018-01-31 NOTE — Progress Notes (Signed)
ANTICOAGULATION CONSULT NOTE - Initial Consult  Pharmacy Consult for heparin Indication: arterial embolism  Allergies  Allergen Reactions  . Chlorthalidone Anaphylaxis, Itching and Rash  . Fentanyl Rash  . Midazolam Rash  . Ace Inhibitors Other (See Comments)    Reaction:  Hyperkalemia, agitation   . Angiotensin Receptor Blockers Other (See Comments)    Reaction:  Hyperkalemia, agitation   . Norvasc [Amlodipine] Itching and Rash  . Phenergan [Promethazine Hcl] Anxiety    "antsy, can't sit still"    Patient Measurements: Height: 5' 4"  (162.6 cm) Weight: 122 lb 2.2 oz (55.4 kg) IBW/kg (Calculated) : 54.7 Heparin Dosing Weight: 55.4 kg  Vital Signs: Temp: 98.6 F (37 C) (10/12 0035) Temp Source: Oral (10/12 0035) BP: 111/52 (10/12 0035) Pulse Rate: 81 (10/12 0035)  Labs: Recent Labs    01/29/18 1722 01/29/18 2136 01/30/18 0627 01/30/18 1345 01/31/18 0351  HGB 13.2  --  11.9*  --  10.6*  HCT 41.8  --  39.0  --  35.1*  PLT 180  --  156  --  157  APTT  --  28  --   --   --   LABPROT  --  14.5  --   --   --   INR  --  1.14  --   --   --   HEPARINUNFRC  --   --   --   --  0.47  CREATININE 15.41*  --  16.71* 6.72*  --     Estimated Creatinine Clearance: 7.1 mL/min (A) (by C-G formula based on SCr of 6.72 mg/dL (H)).   Medical History: Past Medical History:  Diagnosis Date  . Anemia   . Anorexia   . Bacteremia due to Pseudomonas   . CHF (congestive heart failure) (HCC)    has not happened recently  . Complication of anesthesia 01/2016   WITH LAST STENT 10/17. PASsED OUT AND HAD TO BE AWAKENED  . Coronary artery disease involving left main coronary artery 01/2015   [facility]: 70% LM, p-mLAD 50-60% (Resting FFR 0.75), mRCA 80-90%, ~40 Ost OM & D1  . Dysrhythmia   . ESRD (end stage renal disease) on dialysis [facility])    ESRD secondary to acute kidney failure s/p CABG  DIALYSIS M/W/F  . Essential hypertension   . GERD (gastroesophageal reflux disease)   . Heart murmur     no treatment  . Hypercholesterolemia   . Myocardial infarction ([place]) 2016  . 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 Marland Kitchen Haithcock: CABG x 3, LIMA to LAD, SVG to RCA, SVG to OM3, EVH  . Sinusitis 2019  . Type II diabetes mellitus with complication (HCC)    CAD    Medications:  Infusions:  . sodium chloride 10 mL/hr at 01/30/18 1532  .  ceFAZolin (ANCEF) IV Stopped (01/31/18 0054)  . heparin 800 Units/hr (01/31/18 0500)    Assessment: 56 yof now s/p peripheral vascular catheterization with arterial embolism. Pharmacy consulted to dose heparin. No initial bolus due to receiving heparin 5000 units IV x 1 in angio suite per Dr. Delana Meyer.   Goal of Therapy:  Heparin level 0.3-0.7 units/ml Monitor platelets by anticoagulation protocol: Yes   Plan:  10/12 @ 0400 HL 0.47 therapeutic. Will continue current rate and will recheck next HL @ 1000. Of note subq heparin was ordered prior to heparin drip being started, patient received 2 doses of subq heparin while on heparin drip prior to subq being d/c'd. hgb 13.2 >>  11.9 >> 10.6 will continue to monitor.  Tobie Lords, Pharm.D., BCPS Clinical Pharmacist 01/31/2018,5:37 AM

## 2018-02-01 LAB — GLUCOSE, CAPILLARY
Glucose-Capillary: 207 mg/dL — ABNORMAL HIGH (ref 70–99)
Glucose-Capillary: 143 mg/dL — ABNORMAL HIGH (ref 70–99)
Glucose-Capillary: 109 mg/dL — ABNORMAL HIGH (ref 70–99)
Glucose-Capillary: 186 mg/dL — ABNORMAL HIGH (ref 70–99)
Glucose-Capillary: 176 mg/dL — ABNORMAL HIGH (ref 70–99)

## 2018-02-01 LAB — CBC
HCT: 34 % — ABNORMAL LOW (ref 36.0–46.0)
Hemoglobin: 10.3 g/dL — ABNORMAL LOW (ref 12.0–15.0)
MCH: 29.7 pg (ref 26.0–34.0)
MCHC: 30.3 g/dL (ref 30.0–36.0)
MCV: 98 fL (ref 80.0–100.0)
Platelets: 167 10*3/uL (ref 150–400)
RBC: 3.47 MIL/uL — ABNORMAL LOW (ref 3.87–5.11)
RDW: 18.7 % — ABNORMAL HIGH (ref 11.5–15.5)
WBC: 18.5 10*3/uL — ABNORMAL HIGH (ref 4.0–10.5)
nRBC: 0 % (ref 0.0–0.2)

## 2018-02-01 LAB — CULTURE, BLOOD (ROUTINE X 2)
Special Requests: ADEQUATE
Special Requests: ADEQUATE

## 2018-02-01 LAB — HEPARIN LEVEL (UNFRACTIONATED)
Heparin Unfractionated: 0.3 IU/mL (ref 0.30–0.70)
Heparin Unfractionated: 0.52 IU/mL (ref 0.30–0.70)
Heparin Unfractionated: 0.6 IU/mL (ref 0.30–0.70)

## 2018-02-01 NOTE — Progress Notes (Signed)
[facility] Podiatry                                                      Patient Demographics  Stephanie Ellis, is a 67 y.o. female   MRN: [id number]   DOB - 1950/07/14  Admit Date - 01/29/2018    Outpatient Primary MD for the patient is System, Pcp Not In  Consult requested in the Hospital by Epifanio Lesches, MD, On 02/01/2018   With History of -  Past Medical History:  Diagnosis Date  . Anemia   . Anorexia   . Bacteremia due to Pseudomonas   . CHF (congestive heart failure) (HCC)    has not happened recently  . Complication of anesthesia 01/2016   WITH LAST STENT 10/17. PASsED OUT AND HAD TO BE AWAKENED  . Coronary artery disease involving left main coronary artery 01/2015   [facility]: 70% LM, p-mLAD 50-60% (Resting FFR 0.75), mRCA 80-90%, ~40 Ost OM & D1  . Dysrhythmia   . ESRD (end stage renal disease) on dialysis [facility])    ESRD secondary to acute kidney failure s/p CABG  DIALYSIS M/W/F  . Essential hypertension   . GERD (gastroesophageal reflux disease)   . Heart murmur    no treatment  . Hypercholesterolemia   . Myocardial infarction ([place]) 2016  . 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 Marland Kitchen Haithcock: CABG x 3, LIMA to LAD, SVG to RCA, SVG to OM3, EVH  . Sinusitis 2019  . Type II diabetes mellitus with complication (HCC)    CAD      Past Surgical History:  Procedure Laterality Date  . ABDOMINAL HYSTERECTOMY    . ARTERIOVENOUS GRAFT PLACEMENT  05/2016  . AV FISTULA PLACEMENT Left 05/30/2016   Procedure: ARTERIOVENOUS graft;  Surgeon: Algernon Huxley, MD;  Location: [facility] ORS;  Service: Vascular;  Laterality: Left;  . CAPD INSERTION N/A 10/30/2017   Procedure: LAPAROSCOPIC INSERTION CONTINUOUS AMBULATORY PERITONEAL DIALYSIS  (CAPD) CATHETER;  Surgeon: Algernon Huxley, MD;  Location: [facility] ORS;  Service: Vascular;  Laterality: N/A;  . CARDIAC  CATHETERIZATION  01/2015   [facility]: Ost LM 70%, p-m LAD 50-60% (Rest FFR + @ 0.75), mRCA 80-90%, ostD1 40%, pOM1 40%  . CATARACT EXTRACTION W/PHACO Left 01/18/2016   Procedure: CATARACT EXTRACTION PHACO AND INTRAOCULAR LENS PLACEMENT (IOC);  Surgeon: Eulogio Bear, MD;  Location: [facility] ORS;  Service: Ophthalmology;  Laterality: Left;  [place] 1.05 AP% 15.5 CDE 10.16 Fluid Pack Lot # [id number]  . CATARACT EXTRACTION W/PHACO Right 08/01/2016   Procedure: CATARACT EXTRACTION PHACO AND INTRAOCULAR LENS PLACEMENT (IOC);  Surgeon: Eulogio Bear, MD;  Location: [facility] ORS;  Service: Ophthalmology;  Laterality: Right;  [place] 01:00.6 AP% 11.4 CDE 6.93  LOT # [id number]  . COLONOSCOPY    . CORONARY ANGIOPLASTY     SENTS 02/12/16  . CORONARY ARTERY BYPASS GRAFT  03/28/15    [facility]: Dr. Waldemar Dickens: LIMA to LAD, SVG to RCA, SVG to OM3, EVH  . ESOPHAGOGASTRODUODENOSCOPY (EGD) WITH PROPOFOL N/A 10/24/2016   Procedure: ESOPHAGOGASTRODUODENOSCOPY (EGD) WITH PROPOFOL;  Surgeon: [name], MD;  Location[facility] ENDOSCOPY;  Service: Endoscopy;  Laterality: N/A;  . EYE SURGERY Bilateral    cataract surgery  . EYE SURGERY     drains for glaucoma  . INSERTION EXPRESS TUBE SHUNT Right  08/01/2016   Procedure: INSERTION EXPRESS TUBE SHUNT;  Surgeon: Eulogio Bear, MD;  Location: [facility] ORS;  Service: Ophthalmology;  Laterality: Right;  . INSERTION OF AHMED VALVE Left 08/15/2016   Procedure: INSERTION OF AHMED VALVE;  Surgeon: Eulogio Bear, MD;  Location: [facility] ORS;  Service: Ophthalmology;  Laterality: Left;  . INSERTION OF DIALYSIS CATHETER    . TRANSTHORACIC ECHOCARDIOGRAM  January 2017    EF 60-65%. GR 2 DD. Mild degenerative mitral valve disease but no prolapse or regurgitation. Mild left atrial dilation. Mild to moderate LVH. Pericardial effusion gone  . UPPER EXTREMITY ANGIOGRAPHY Left 06/27/2016   Procedure: Upper Extremity Angiography;  Surgeon: Algernon Huxley, MD;  Location: [place] CV LAB;  Service:  Cardiovascular;  Laterality: Left;  . UPPER EXTREMITY ANGIOGRAPHY Left 08/22/2016   Procedure: Upper Extremity Angiography;  Surgeon: Algernon Huxley, MD;  Location: [place] CV LAB;  Service: Cardiovascular;  Laterality: Left;    in for   Chief Complaint  Patient presents with  . Leg Pain     HPI  Stephanie Ellis  is a 68 y.o. female, patient underwent a left leg catheterization angioplasty on Friday.  Yesterday she was still having a bit of pain with the foot but her leg and foot were warmer.  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    Review of Systems: Patient's alert and oriented she is with her family today.  No new complaints  In addition to the HPI above,  No Fever-chills, No Headache, No changes with Vision or hearing, No problems swallowing food or Liquids, No Chest pain, Cough or Shortness of Breath, No Abdominal pain, No Nausea or Vommitting, Bowel movements are regular, No Blood in stool or Urine, No dysuria, No new skin rashes or bruises, No new joints pains-aches,  No new weakness, tingling, numbness in any extremity, No recent weight gain or loss, No polyuria, polydypsia or polyphagia, No significant Mental Stressors.  A full 10 point Review of Systems was done, except as stated above, all other Review of Systems were negative.   Social History Social History   Tobacco Use  . Smoking status: Never Smoker  . Smokeless tobacco: Never Used  Substance Use Topics  . Alcohol use: No    Family History Family History  Problem Relation Age of Onset  . Diabetes Mellitus II Mother   . Pancreatic cancer Father     Prior to Admission medications   Medication Sig Start Date End Date Taking? Authorizing Provider  aspirin EC 81 MG tablet Take 1 tablet (81 mg total) by mouth daily. 10/19/16  Yes Dustin Flock, MD  atorvastatin (LIPITOR) 80 MG tablet Take 80 mg by mouth at bedtime.  05/05/17  Yes [provider]  calcitRIOL (ROCALTROL) 0.5  MCG capsule Take 0.5 mcg by mouth daily.   Yes [provider]  carvedilol (COREG) 3.125 MG tablet Take 3.125 mg by mouth 2 (two) times daily with a meal.  03/06/17 03/01/18 Yes [provider]  Cholecalciferol 1000 units tablet Take 1 tablet by mouth daily. Monday, Wednesday and Friday at dialysis   Yes [provider]  insulin glargine (LANTUS) 100 unit/mL SOPN Inject 14 Units into the skin at bedtime.    Yes [provider]  isosorbide mononitrate (IMDUR) 30 MG 24 hr tablet Take 30 mg by mouth daily.    Yes [provider]  mirtazapine (REMERON) 7.5 MG tablet Take 7.5 mg by mouth at bedtime.  Yes [provider]  Multiple Vitamins-Minerals (PRORENAL + D) TABS Take 1 tablet by mouth daily.   Yes [provider]  nitroGLYCERIN (NITROSTAT) 0.4 MG SL tablet Place 1 tablet (0.4 mg total) under the tongue every 5 (five) minutes as needed for chest pain. 09/05/16  Yes Wieting, Richard, MD  Omega-3 300 MG CAPS Take 2 capsules by mouth daily.   Yes [provider]  valsartan (DIOVAN) 80 MG tablet Take 80 mg by mouth 2 (two) times daily.  07/16/17  Yes [provider]  ATROPINE SULFATE-NACL OP Apply 1 drop to eye 2 (two) times daily. Left eye only    [provider]  gentamicin cream (GARAMYCIN) 0.1 % APPLY TO EXIT SITE DAILY 11/10/17   [provider]  HYDROcodone-acetaminophen (NORCO) 5-325 MG tablet Take 1 tablet by mouth every 6 (six) hours as needed for moderate pain. Patient not taking: Reported on 01/29/2018 10/30/17   Algernon Huxley, MD  lisinopril (PRINIVIL,ZESTRIL) 10 MG tablet Take 10 mg by mouth daily. 11/04/16 11/04/17  [provider]  metoCLOPramide (REGLAN) 10 MG tablet Take 1 tablet (10 mg total) by mouth every 6 (six) hours as needed for nausea, vomiting or refractory nausea / vomiting. Patient not taking: Reported on 01/29/2018 10/25/16   Dustin Flock, MD  metoprolol tartrate (LOPRESSOR)  25 MG tablet Take 25 mg by mouth 2 (two) times daily.     [provider]  Multiple Vitamin (MULTIVITAMIN WITH MINERALS) TABS tablet Take 2 tablets by mouth daily. Gummy vitamins    [provider]  ondansetron (ZOFRAN) 4 MG tablet Take 4 mg by mouth every 8 (eight) hours as needed for nausea or vomiting.    [provider]  polyvinyl alcohol (LIQUIFILM TEARS) 1.4 % ophthalmic solution Place 1 drop into both eyes 2 (two) times daily.     [provider]  predniSONE (DELTASONE) 10 MG tablet Take 10 mg by mouth daily with breakfast. taper    [provider]  ranitidine (ZANTAC) 300 MG tablet Take 1 tablet (300 mg total) by mouth at bedtime. Patient not taking: Reported on 01/29/2018 09/27/17   Darel Hong, MD  sevelamer carbonate (RENVELA) 800 MG tablet Take 800 mg by mouth 3 (three) times daily with meals.    [provider]    Anti-infectives (From admission, onward)   Start     Dose/Rate Route Frequency Ordered Stop   01/31/18 2200  ceFAZolin (ANCEF) IVPB 1 g/50 mL premix     1 g 100 mL/hr over 30 Minutes Intravenous Every 24 hours 01/31/18 1409     01/30/18 1800  vancomycin (VANCOCIN) IVPB 1000 mg/200 mL premix  Status:  Discontinued     1,000 mg 200 mL/hr over 60 Minutes Intravenous Every M-W-F (Hemodialysis) 01/30/18 0435 01/30/18 0906   01/30/18 1000  piperacillin-tazobactam (ZOSYN) IVPB 3.375 g  Status:  Discontinued     3.375 g 12.5 mL/hr over 240 Minutes Intravenous Every 12 hours 01/30/18 0435 01/30/18 0906   01/30/18 1000  ceFAZolin (ANCEF) 500 mg in dextrose 5 % 100 mL IVPB  Status:  Discontinued     500 mg 210 mL/hr over 30 Minutes Intravenous Every 12 hours 01/30/18 0906 01/31/18 1409   01/29/18 2200  piperacillin-tazobactam (ZOSYN) IVPB 3.375 g     3.375 g 100 mL/hr over 30 Minutes Intravenous  Once 01/29/18 2146 01/29/18 2242   01/29/18 2200  vancomycin (VANCOCIN) IVPB 1000 mg/200 mL premix     1,000 mg 200 mL/hr  over  60 Minutes Intravenous  Once 01/29/18 2146 01/29/18 2314      Scheduled Meds: . aspirin EC  81 mg Oral Daily  . atorvastatin  80 mg Oral QHS  . Atropine Sulfate-NaCl  1 drop Ophthalmic BID  . carvedilol  3.125 mg Oral BID WC  . Chlorhexidine Gluconate Cloth  6 each Topical Q0600  . cholecalciferol  400 Units Oral Once per day on Mon Wed Fri  . docusate sodium  100 mg Oral BID  . [START ON 02/02/2018] epoetin (EPOGEN/PROCRIT) injection  4,000 Units Intravenous Q M,W,F-HD  . famotidine  20 mg Oral Daily  . feeding supplement (NEPRO CARB STEADY)  237 mL Oral BID BM  . insulin aspart  0-5 Units Subcutaneous QHS  . insulin aspart  0-9 Units Subcutaneous TID WC  . irbesartan  75 mg Oral Daily  . isosorbide mononitrate  30 mg Oral Daily  . multivitamin  1 tablet Oral QHS  . multivitamin with minerals  1 tablet Oral Daily  . multivitamin-lutein  1 capsule Oral Daily  . sevelamer carbonate  800 mg Oral TID WC   Continuous Infusions: . sodium chloride 10 mL/hr at 01/30/18 1532  .  ceFAZolin (ANCEF) IV Stopped (02/01/18 0238)  . heparin 850 Units/hr (02/01/18 0455)   PRN Meds:.acetaminophen **OR** acetaminophen, bisacodyl, metoCLOPramide (REGLAN) injection, morphine injection, nitroGLYCERIN, ondansetron **OR** ondansetron (ZOFRAN) IV, oxyCODONE-acetaminophen, traZODone  Allergies  Allergen Reactions  . Chlorthalidone Anaphylaxis, Itching and Rash  . Fentanyl Rash  . Midazolam Rash  . Ace Inhibitors Other (See Comments)    Reaction:  Hyperkalemia, agitation   . Angiotensin Receptor Blockers Other (See Comments)    Reaction:  Hyperkalemia, agitation   . Norvasc [Amlodipine] Itching and Rash  . Phenergan [Promethazine Hcl] Anxiety    "antsy, can't sit still"    Physical Exam  Vitals  Blood pressure (!) 115/51, pulse 74, temperature 98.5 F (36.9 C), temperature source Oral, resp. rate 18, height 5' 4"  (1.626 m), weight 55.4 kg, SpO2 95 %.  Lower Extremity exam:  Vascular:  Nonpalpable at present but the lateral toes and lateral portion of the foot are warmer.  The lower lateral lower leg is warmer.  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  Concerns for tissue damage more proximal and dorsal as well.  There is a line of discoloration that runs approximately from the great toe along the dorsum of the foot almost reaching the ankle.  This area does not appear as dark as the toe but is discolored as compared to her other skin in the region.  No open wounds or drainage are noted to the region.  Neurological: Patient appears to be sensate to the foot.  Ortho: No gross deformities but patient has obvious gangrene to the left hallux.  Data Review  CBC Recent Labs  Lab 01/29/18 1722 01/30/18 0627 01/31/18 0351 02/01/18 0324  WBC 13.2* 16.7* 14.9* 18.5*  HGB 13.2 11.9* 10.6* 10.3*  HCT 41.8 39.0 35.1* 34.0*  PLT 180 156 157 167  MCV 95.2 97.5 99.2 98.0  MCH 30.1 29.8 29.9 29.7  MCHC 31.6 30.5 30.2 30.3  RDW 18.7* 18.7* 18.9* 18.7*   ------------------------------------------------------------------------------------------------------------------  Chemistries  Recent Labs  Lab 01/29/18 1722 01/30/18 0627 01/30/18 1345  NA 135 135 139  K 4.9 6.1* 3.2*  CL 91* 93* 98  CO2 26 25 27   GLUCOSE 216* 195* 122*  BUN 93* 97* 35*  CREATININE 15.41* 16.71* 6.72*  CALCIUM 8.6* 8.2* 8.2*   -----  Assessment & Plan: Overall her circulation is improved to the left lower extremity.  Her left hallux will undoubtedly need to be amputated at some point.  My concern is the viability of the tissue proximal especially on the medial aspect of the foot medial dorsal aspect.  I think it is prudent to see if tissues will improve and heal so we have a better idea as the level of amputation which may take the rest of this week to come to that conclusion.  Also want vascular tech in a look at her over the course of the next week to see what their opinion is on level of  amputation.  Active Problems:   Sepsis [facility])   Family Communication: Plan discussed with patient and **  Albertine Patricia M.D on 02/01/2018 at 12:41 PM  Thank you for the consult, we will follow the patient with you in the Hospital.

## 2018-02-01 NOTE — Progress Notes (Signed)
[place] for heparin Indication: arterial embolism  Allergies  Allergen Reactions  . Chlorthalidone Anaphylaxis, Itching and Rash  . Fentanyl Rash  . Midazolam Rash  . Ace Inhibitors Other (See Comments)    Reaction:  Hyperkalemia, agitation   . Angiotensin Receptor Blockers Other (See Comments)    Reaction:  Hyperkalemia, agitation   . Norvasc [Amlodipine] Itching and Rash  . Phenergan [Promethazine Hcl] Anxiety    "antsy, can't sit still"    Patient Measurements: Height: 5' 4"  (162.6 cm) Weight: 122 lb 2.2 oz (55.4 kg) IBW/kg (Calculated) : 54.7 Heparin Dosing Weight: 55.4 kg  Vital Signs: Temp: 98.5 F (36.9 C) (10/13 0559) Temp Source: Oral (10/13 0559) BP: 115/51 (10/13 0559) Pulse Rate: 74 (10/13 0559)  Labs: Recent Labs    01/29/18 1722 01/29/18 2136 01/30/18 0627 01/30/18 1345 01/31/18 0351 01/31/18 1307 02/01/18 0324  HGB 13.2  --  11.9*  --  10.6*  --  10.3*  HCT 41.8  --  39.0  --  35.1*  --  34.0*  PLT 180  --  156  --  157  --  167  APTT  --  28  --   --   --   --   --   LABPROT  --  14.5  --   --   --   --   --   INR  --  1.14  --   --   --   --   --   HEPARINUNFRC  --   --   --   --  0.47 0.46 0.30  CREATININE 15.41*  --  16.71* 6.72*  --   --   --     Estimated Creatinine Clearance: 7.1 mL/min (A) (by C-G formula based on SCr of 6.72 mg/dL (H)).   Medical History: Past Medical History:  Diagnosis Date  . Anemia   . Anorexia   . Bacteremia due to Pseudomonas   . CHF (congestive heart failure) (HCC)    has not happened recently  . Complication of anesthesia 01/2016   WITH LAST STENT 10/17. PASsED OUT AND HAD TO BE AWAKENED  . Coronary artery disease involving left main coronary artery 01/2015   [facility]: 70% LM, p-mLAD 50-60% (Resting FFR 0.75), mRCA 80-90%, ~40 Ost OM & D1  . Dysrhythmia   . ESRD (end stage renal disease) on dialysis [facility])    ESRD secondary to acute kidney failure s/p CABG  DIALYSIS  M/W/F  . Essential hypertension   . GERD (gastroesophageal reflux disease)   . Heart murmur    no treatment  . Hypercholesterolemia   . Myocardial infarction ([place]) 2016  . 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 Marland Kitchen Haithcock: CABG x 3, LIMA to LAD, SVG to RCA, SVG to OM3, EVH  . Sinusitis 2019  . Type II diabetes mellitus with complication (HCC)    CAD    Medications:  Infusions:  . sodium chloride 10 mL/hr at 01/30/18 1532  .  ceFAZolin (ANCEF) IV Stopped (02/01/18 0238)  . heparin 850 Units/hr (02/01/18 0455)    Assessment: 63 yof now s/p peripheral vascular catheterization with arterial embolism. Pharmacy consulted to dose heparin. No initial bolus due to receiving heparin 5000 units IV x 1 in angio suite per Dr. Delana Meyer. Hemoglobin is trending down following limb salvage angioplasty 10/11 but stabilizing.Epo ordered with HD  Heparin was started at 800 units/hr 10/12 0351 HL 0.47:  10/12 1307  HL 0.46 10/13 0324 HL 0.30: rate increased to 850 units/hr 10/13 1200 HL 0.52  Goal of Therapy:  Heparin level 0.3-0.7 units/ml Monitor platelets by anticoagulation protocol: Yes   Plan: continue infusion at 850 units/hr and re-check anti-Xa in 8 hours to confirm  Dallie Piles, Pharm.D. Clinical Pharmacist 02/01/2018,12:32 PM

## 2018-02-01 NOTE — Progress Notes (Signed)
Pt stated after talking with family and pastor that she NO longer wanted to be a DNR but be made a full code and that she wanted to continue with care as planned. Primary nurse paged and spoke to Dr. Jodell Cipro in regard to Pt request. Orders placed for Full Code by Dr. Jodell Cipro. Primary nurse to continue to monitor

## 2018-02-01 NOTE — Progress Notes (Signed)
[place] at [facility]                                                                                                                                                                                  Patient Demographics   Stephanie Ellis, is a 68 y.o. female, DOB - 20-Nov-1950, HYI:[id number]  Admit date - 01/29/2018   Admitting Physician Amelia Jo, MD  Outpatient Primary MD for the patient is System, Pcp Not In   LOS - 3  Subjective: Status post left leg angiogram by vascular  for ischemia of left leg, status post angioplasty of left popliteal artery, left foot is warm, but left great toe is still cyanotic. Patient has bad nausea yesterday but feels better now.  Overnight events reviewed.  Patient is very happy today her nausea improved and she feels like a different person.  Denies any left foot pain as well.  Left foot great toe is still black and also edematous.  No fever.  Review of Systems:   CONSTITUTIONAL: No documented fever. No fatigue, weakness. No weight gain, no weight loss.  EYES: No blurry or double vision.  ENT: No tinnitus. No postnasal drip. No redness of the oropharynx.  RESPIRATORY: No cough, no wheeze, no hemoptysis. No dyspnea.  CARDIOVASCULAR: No chest pain. No orthopnea. No palpitations. No syncope.  GASTROINTESTINAL: No nausea or abdominal pain today.   GENITOURINARY: No dysuria or hematuria.  ENDOCRINE: No polyuria or nocturia. No heat or cold intolerance.  HEMATOLOGY: No anemia. No bruising. No bleeding.  INTEGUMENTARY: No rashes. No lesions.  MUSCULOSKELETAL: No arthritis. No swelling. No gout.  Left foot osteomyelitis, NEUROLOGIC: No numbness, tingling, or ataxia. No seizure-type activity.  PSYCHIATRIC: No anxiety. No insomnia. No ADD.    Vitals:   Vitals:   01/31/18 2004 01/31/18 2010 01/31/18 2040 02/01/18 0559  BP: (!) 116/55 (!) 208/76 (!) 170/69 (!) 115/51  Pulse: 74 83 74 74  Resp:    18  Temp: (!) 97.3 F (36.3  C)   98.5 F (36.9 C)  TempSrc: Oral   Oral  SpO2: (!) 78%  91% 100%  Weight:      Height:        Wt Readings from Last 3 Encounters:  01/30/18 55.4 kg  10/15/17 58.1 kg  10/09/17 58.1 kg     Intake/Output Summary (Last 24 hours) at 02/01/2018 1042 Last data filed at 02/01/2018 0952 Gross per 24 hour  Intake 543.16 ml  Output 0 ml  Net 543.16 ml    Physical Exam:   GENERAL: Pleasant-appearing in no apparent distress.  HEAD, EYES, EARS, NOSE AND THROAT: Atraumatic, normocephalic. Extraocular muscles are intact.  Pupils equal and reactive to light. Sclerae anicteric. No conjunctival injection. No oro-pharyngeal erythema.  NECK: Supple. There is no jugular venous distention. No bruits, no lymphadenopathy, no thyromegaly.  HEART: Regular rate and rhythm,. No murmurs, no rubs, no clicks.  LUNGS: Clear to auscultation bilaterally. No rales or rhonchi. No wheezes.  ABDOMEN: Soft, flat, nontender, nondistended. Has good bowel sounds. No hepatosplenomegaly appreciated.  EXTREMITIES: Left great toe with ulcer, still has cyanosis of the left great toe. NEUROLOGIC: The patient is alert, awake, and oriented x3 with no focal motor or sensory deficits appreciated bilaterally.  SKIN: Moist and warm with no rashes appreciated.  Psych: Not anxious, depressed LN: No inguinal LN enlargement    Antibiotics   Anti-infectives (From admission, onward)   Start     Dose/Rate Route Frequency Ordered Stop   01/31/18 2200  ceFAZolin (ANCEF) IVPB 1 g/50 mL premix     1 g 100 mL/hr over 30 Minutes Intravenous Every 24 hours 01/31/18 1409     01/30/18 1800  vancomycin (VANCOCIN) IVPB 1000 mg/200 mL premix  Status:  Discontinued     1,000 mg 200 mL/hr over 60 Minutes Intravenous Every M-W-F (Hemodialysis) 01/30/18 0435 01/30/18 0906   01/30/18 1000  piperacillin-tazobactam (ZOSYN) IVPB 3.375 g  Status:  Discontinued     3.375 g 12.5 mL/hr over 240 Minutes Intravenous Every 12 hours 01/30/18 0435  01/30/18 0906   01/30/18 1000  ceFAZolin (ANCEF) 500 mg in dextrose 5 % 100 mL IVPB  Status:  Discontinued     500 mg 210 mL/hr over 30 Minutes Intravenous Every 12 hours 01/30/18 0906 01/31/18 1409   01/29/18 2200  piperacillin-tazobactam (ZOSYN) IVPB 3.375 g     3.375 g 100 mL/hr over 30 Minutes Intravenous  Once 01/29/18 2146 01/29/18 2242   01/29/18 2200  vancomycin (VANCOCIN) IVPB 1000 mg/200 mL premix     1,000 mg 200 mL/hr over 60 Minutes Intravenous  Once 01/29/18 2146 01/29/18 2314      Medications   Scheduled Meds: . aspirin EC  81 mg Oral Daily  . atorvastatin  80 mg Oral QHS  . Atropine Sulfate-NaCl  1 drop Ophthalmic BID  . carvedilol  3.125 mg Oral BID WC  . Chlorhexidine Gluconate Cloth  6 each Topical Q0600  . cholecalciferol  400 Units Oral Once per day on Mon Wed Fri  . docusate sodium  100 mg Oral BID  . [START ON 02/02/2018] epoetin (EPOGEN/PROCRIT) injection  4,000 Units Intravenous Q M,W,F-HD  . famotidine  20 mg Oral Daily  . feeding supplement (NEPRO CARB STEADY)  237 mL Oral BID BM  . insulin aspart  0-5 Units Subcutaneous QHS  . insulin aspart  0-9 Units Subcutaneous TID WC  . irbesartan  75 mg Oral Daily  . isosorbide mononitrate  30 mg Oral Daily  . multivitamin  1 tablet Oral QHS  . multivitamin with minerals  1 tablet Oral Daily  . multivitamin-lutein  1 capsule Oral Daily  . sevelamer carbonate  800 mg Oral TID WC   Continuous Infusions: . sodium chloride 10 mL/hr at 01/30/18 1532  .  ceFAZolin (ANCEF) IV Stopped (02/01/18 0238)  . heparin 850 Units/hr (02/01/18 0455)   PRN Meds:.acetaminophen **OR** acetaminophen, bisacodyl, metoCLOPramide (REGLAN) injection, morphine injection, nitroGLYCERIN, ondansetron **OR** ondansetron (ZOFRAN) IV, oxyCODONE-acetaminophen, traZODone   Data Review:   Micro Results Recent Results (from the past 240 hour(s))  Blood Culture (routine x 2)     Status: Abnormal   Collection Time:  01/29/18  9:55 PM  Result  Value Ref Range Status   Specimen Description   Final    BLOOD RIGHT ANTECUBITAL Performed at [facility], [address], [place], [place] [address]    Special Requests   Final    BOTTLES DRAWN AEROBIC AND ANAEROBIC Blood Culture adequate volume Performed at [facility], [address] [place], [place] [address]    Culture  Setup Time   Final    GRAM POSITIVE COCCI IN CLUSTERS IN BOTH AEROBIC AND ANAEROBIC BOTTLES CRITICAL RESULT CALLED TO, READ BACK BY AND VERIFIED WITH: KAREN HAYES 01/30/18 0839 JGF Performed at [facility], [address] [place], [place] [address]    Culture (A)  Final    STAPHYLOCOCCUS AUREUS SUSCEPTIBILITIES PERFORMED ON PREVIOUS CULTURE WITHIN THE LAST 5 DAYS. Performed at [facility], [place] [address], [place], [place] [address]    Report Status 02/01/2018 FINAL  Final  Blood Culture (routine x 2)     Status: Abnormal   Collection Time: 01/29/18  9:55 PM  Result Value Ref Range Status   Specimen Description   Final    BLOOD RIGHT ANTECUBITAL Performed at [facility], [address] [place], [place] [address]    Special Requests   Final    BOTTLES DRAWN AEROBIC AND ANAEROBIC Blood Culture adequate volume Performed at [facility], [address] [place],  [address]    Culture  Setup Time   Final    GRAM POSITIVE COCCI IN CLUSTERS IN BOTH AEROBIC AND ANAEROBIC BOTTLES CRITICAL RESULT CALLED TO, READ BACK BY AND VERIFIED WITH: KAREN HAYES 01/30/18 0839 JGF Performed at [facility], [place] [address], [place],  [address]    Culture STAPHYLOCOCCUS AUREUS (A)  Final   Report Status 02/01/2018 FINAL  Final   Organism ID, Bacteria STAPHYLOCOCCUS AUREUS  Final      Susceptibility   Staphylococcus aureus - MIC*    CIPROFLOXACIN <=0.5 SENSITIVE Sensitive     ERYTHROMYCIN <=0.25 SENSITIVE Sensitive     GENTAMICIN <=0.5 SENSITIVE Sensitive     OXACILLIN 0.5 SENSITIVE Sensitive      TETRACYCLINE <=1 SENSITIVE Sensitive     VANCOMYCIN <=0.5 SENSITIVE Sensitive     TRIMETH/SULFA <=10 SENSITIVE Sensitive     CLINDAMYCIN <=0.25 SENSITIVE Sensitive     RIFAMPIN <=0.5 SENSITIVE Sensitive     Inducible Clindamycin NEGATIVE Sensitive     * STAPHYLOCOCCUS AUREUS  Blood Culture ID Panel (Reflexed)     Status: Abnormal   Collection Time: 01/29/18  9:55 PM  Result Value Ref Range Status   Enterococcus species NOT DETECTED NOT DETECTED Final   Vancomycin resistance NOT DETECTED NOT DETECTED Final   Listeria monocytogenes NOT DETECTED NOT DETECTED Final   Staphylococcus species DETECTED (A) NOT DETECTED Final    Comment: CRITICAL RESULT CALLED TO, READ BACK BY AND VERIFIED WITH: KAREN HAYES 01/30/18 0839 JGF    Staphylococcus aureus (BCID) DETECTED (A) NOT DETECTED Final    Comment: CRITICAL RESULT CALLED TO, READ BACK BY AND VERIFIED WITH: KAREN HAYES 01/30/18 0839 JGF Methicillin (oxacillin) susceptible Staphylococcus aureus (MSSA). Preferred therapy is anti staphylococcal beta lactam antibiotic (Cefazolin or Nafcillin), unless clinically contraindicated.    Methicillin resistance NOT DETECTED NOT DETECTED Final   Streptococcus species NOT DETECTED NOT DETECTED Final   Streptococcus agalactiae NOT DETECTED NOT DETECTED Final   Streptococcus pneumoniae NOT DETECTED NOT DETECTED Final   Streptococcus pyogenes NOT DETECTED NOT DETECTED Final  Acinetobacter baumannii NOT DETECTED NOT DETECTED Final   Enterobacteriaceae species NOT DETECTED NOT DETECTED Final   Enterobacter cloacae complex NOT DETECTED NOT DETECTED Final   Escherichia coli NOT DETECTED NOT DETECTED Final   Klebsiella oxytoca NOT DETECTED NOT DETECTED Final   Klebsiella pneumoniae NOT DETECTED NOT DETECTED Final   Proteus species NOT DETECTED NOT DETECTED Final   Serratia marcescens NOT DETECTED NOT DETECTED Final   Carbapenem resistance NOT DETECTED NOT DETECTED Final   Haemophilus influenzae NOT  DETECTED NOT DETECTED Final   Neisseria meningitidis NOT DETECTED NOT DETECTED Final   Pseudomonas aeruginosa NOT DETECTED NOT DETECTED Final   Candida albicans NOT DETECTED NOT DETECTED Final   Candida glabrata NOT DETECTED NOT DETECTED Final   Candida krusei NOT DETECTED NOT DETECTED Final   Candida parapsilosis NOT DETECTED NOT DETECTED Final   Candida tropicalis NOT DETECTED NOT DETECTED Final    Comment: Performed at [facility], [address] [place], [place] [address]  MRSA PCR Screening     Status: None   Collection Time: 01/29/18 10:55 PM  Result Value Ref Range Status   MRSA by PCR NEGATIVE NEGATIVE Final    Comment:        The GeneXpert MRSA Assay (FDA approved for NASAL specimens only), is one component of a comprehensive MRSA colonization surveillance program. It is not intended to diagnose MRSA infection nor to guide or monitor treatment for MRSA infections. Performed at [facility], [address] [place], [place] [address]   Culture, blood (Routine X 2) w Reflex to ID Panel     Status: None (Preliminary result)   Collection Time: 01/30/18  9:27 AM  Result Value Ref Range Status   Specimen Description BLOOD RIGHT ANTECUBITAL  Final   Special Requests   Final    BOTTLES DRAWN AEROBIC AND ANAEROBIC Blood Culture adequate volume   Culture   Final    NO GROWTH 2 DAYS Performed at [facility], [address] [place], [place] [address]    Report Status PENDING  Incomplete    Radiology Reports Dg Tibia/fibula Left  Result Date: 01/29/2018 CLINICAL DATA:  Presents with ED via ems from home Having pain to both lower legs But states left leg is worse Increased pain with palpation Decreased pulses to foot EXAM: LEFT TIBIA AND FIBULA - 2 VIEW COMPARISON:  None. FINDINGS: No fracture.  No bone lesion. Knee and ankle joints are normally aligned. There are arterial vascular calcifications. Soft tissues are otherwise unremarkable.  IMPRESSION: Negative Electronically Signed   By: Lajean Manes M.D.   On: 01/29/2018 20:58   US Venous Img Lower Unilateral Left  Result Date: 01/29/2018 CLINICAL DATA:  Left leg pain EXAM: LEFT LOWER EXTREMITY VENOUS DOPPLER ULTRASOUND TECHNIQUE: Gray-scale sonography with graded compression, as well as color Doppler and duplex ultrasound were performed to evaluate the lower extremity deep venous systems from the level of the common femoral vein and including the common femoral, femoral, profunda femoral, popliteal and calf veins including the posterior tibial, peroneal and gastrocnemius veins when visible. The superficial great saphenous vein was also interrogated. Spectral Doppler was utilized to evaluate flow at rest and with distal augmentation maneuvers in the common femoral, femoral and popliteal veins. COMPARISON:  None. FINDINGS: Contralateral Common Femoral Vein: Respiratory phasicity is normal and symmetric with the symptomatic side. No evidence of thrombus. Normal compressibility. Common Femoral Vein: No evidence of thrombus. Normal compressibility, respiratory phasicity and response to augmentation. Saphenofemoral Junction: No  evidence of thrombus. Normal compressibility and flow on color Doppler imaging. Profunda Femoral Vein: No evidence of thrombus. Normal compressibility and flow on color Doppler imaging. Femoral Vein: No evidence of thrombus. Normal compressibility, respiratory phasicity and response to augmentation. Popliteal Vein: No evidence of thrombus. Normal compressibility, respiratory phasicity and response to augmentation. Calf Veins: No evidence of thrombus. Normal compressibility and flow on color Doppler imaging. Superficial Great Saphenous Vein: No evidence of thrombus. Normal compressibility. Venous Reflux:  None. Other Findings:  None. IMPRESSION: No evidence of deep venous thrombosis. Electronically Signed   By: Rolm Baptise M.D.   On: 01/29/2018 18:36   Dg Toe Great  Left  Result Date: 01/29/2018 CLINICAL DATA:  Open wound to the left great toe for several months. EXAM: LEFT GREAT TOE COMPARISON:  10/07/2017 FINDINGS: Degenerative changes in the first metatarsal-phalangeal joint. Soft tissue defect over the plantar aspect of the left first toe. There is underlying erosion of the distal phalangeal tuft cortex suggesting focal osteomyelitis. No evidence of acute fracture or dislocation. Diffuse bone demineralization. Prominent vascular calcifications. IMPRESSION: Soft tissue defect over the plantar aspect of the left first toe with underlying erosion of the distal phalangeal tuft suggesting osteomyelitis. Electronically Signed   By: Lucienne Capers M.D.   On: 01/29/2018 21:30     CBC Recent Labs  Lab 01/29/18 1722 01/30/18 0627 01/31/18 0351 02/01/18 0324  WBC 13.2* 16.7* 14.9* 18.5*  HGB 13.2 11.9* 10.6* 10.3*  HCT 41.8 39.0 35.1* 34.0*  PLT 180 156 157 167  MCV 95.2 97.5 99.2 98.0  MCH 30.1 29.8 29.9 29.7  MCHC 31.6 30.5 30.2 30.3  RDW 18.7* 18.7* 18.9* 18.7*    Chemistries  Recent Labs  Lab 01/29/18 1722 01/30/18 0627 01/30/18 1345  NA 135 135 139  K 4.9 6.1* 3.2*  CL 91* 93* 98  CO2 26 25 27   GLUCOSE 216* 195* 122*  BUN 93* 97* 35*  CREATININE 15.41* 16.71* 6.72*  CALCIUM 8.6* 8.2* 8.2*   ------------------------------------------------------------------------------------------------------------------ estimated creatinine clearance is 7.1 mL/min (A) (by C-G formula based on SCr of 6.72 mg/dL (H)). ------------------------------------------------------------------------------------------------------------------ No results for input(s): HGBA1C in the last 72 hours. ------------------------------------------------------------------------------------------------------------------ No results for input(s): CHOL, HDL, LDLCALC, TRIG, CHOLHDL, LDLDIRECT in the last 72  hours. ------------------------------------------------------------------------------------------------------------------ No results for input(s): TSH, T4TOTAL, T3FREE, THYROIDAB in the last 72 hours.  Invalid input(s): FREET3 ------------------------------------------------------------------------------------------------------------------ No results for input(s): VITAMINB12, FOLATE, FERRITIN, TIBC, IRON, RETICCTPCT in the last 72 hours.  Coagulation profile Recent Labs  Lab 01/29/18 2136  INR 1.14    No results for input(s): DDIMER in the last 72 hours.  Cardiac Enzymes No results for input(s): CKMB, TROPONINI, MYOGLOBIN in the last 168 hours.  Invalid input(s): CK ------------------------------------------------------------------------------------------------------------------ Invalid input(s): [place]  Patient admitted with sepsis   1.  Sepsis,  with MSSA with left great toe osteomyelitis.    Continue broad-spectrum antibiotics apreciate podiatry input plan for surgery .  Positive MSSA in the blood Acute ischemia of the left foot status post angioplasty of left popliteal by vascular yesterday, continue heparin drip, continue antibiotics for left great toe osteomyelitis, patient still has gangrenous changes in the left great toe, significant pain, seen by podiatry, with the demarcation to see if the necrosis gets worse, patient is hopeful that it will get better with revascularization, podiatry to follow make further recommendation about possible left great MTP joint resection if no improvement. The patient and patient's sisters.  Patient sister wants to  talk to Dr. Elvina Mattes today.    2.    Acute encephalopathy due to #1 now improved, overnight events reviewed.  Patient encephalopathy resolved, nausea also improved.  She is alert and in good mood today.  3.  PAD.,  Left leg ischemia, status post hospitalization, continue aspirin, heparin drip, statins 4.   End-stage renal disease on peritoneal dialysis.    Nephrology has been notified, patient does not want peritoneal dialysis anymore, continue hemodialysis through right IJ permacath Monday, Wednesday, Friday.  5.  Diabetes type 2.  Continue to monitor blood sugars before meals and at bedtime and use insulin treatment during the hospital stay.  Continue carb controlled renal diet. 6. CHF, currently clinically compensated, continue maintenance therapy.  Decrease the dose of Coreg, Imdur because patient blood pressure is soft..  Midodrine as needed is ordered by nephrology. 7.  Peripheral neuropathy, continue pain medication. #8. nausea likely secondary to sepsis/narcotic induced: And possible diabetic gastroparesis: Patient is on Reglan, nausea improved, advance and see how she tolerates the diet today..  #.9. hyperkalemia: Potassium decreased from 6.1-3.2.     Code Status Orders  (From admission, onward)         Start     Ordered   01/29/18 2250  Full code  Continuous     01/29/18 2250        Code Status History    Date Active Date Inactive Code Status Order ID Comments User Context   10/24/2016 1055 10/25/2016 2243 Full Code [id number]  Hillary Bow, MD ED   10/17/2016 2143 10/19/2016 1440 Full Code [id number]  Vaughan Basta, MD Inpatient   09/04/2016 1708 09/05/2016 1500 Full Code [id number]  Epifanio Lesches, MD ED   08/22/2016 0938 08/22/2016 1833 Full Code [id number]  Algernon Huxley, MD Inpatient   06/27/2016 1121 06/27/2016 1846 Full Code [id number]  Algernon Huxley, MD Inpatient   10/03/2015 2235 10/04/2015 1724 Full Code [id number]  Harrie Foreman, MD Inpatient           Consults  NEPHROLOGY AND VASCULAR SURGERY   HEPRIN  Lab Results  Component Value Date   PLT 167 02/01/2018   Discussed with patient sisters at bedside.  Patient also understands possible need for left great toe surgery because of gangrene.  Time Spent in minutes 38MIN   Greater than 50% of time spent in  care coordination and counseling patient regarding the condition and plan of care.   Epifanio Lesches M.D on 02/01/2018 at 10:42 AM  Between 7am to 6pm - Pager - [phone]  After 6pm go to [email or web address] - Proofreader  [employer]   Office  [phone]

## 2018-02-01 NOTE — Progress Notes (Signed)
[facility]  ROUNDING NOTE   Subjective:   Stephanie Ellis admitted to [facility] on 01/29/2018 for Peripheral vascular disease [facility]) [I73.9] Right leg pain [M79.604] Left leg pain [M79.605] Ulcer of great toe, left, with unspecified severity ([place]) [L97.529]   Patient underwent limb salvage procedure this admission Events of last night were noted.  CODE BLUE was called for patient but she did not require CPR.  She was bagged by the respiratory therapist.  She then regained her consciousness, vomited several times but felt better.  This morning, she is sitting up in her chair.  No acute complaints.   Objective:  Vital signs in last 24 hours:  Temp:  [97.3 F (36.3 C)-98.5 F (36.9 C)] 98.5 F (36.9 C) (10/13 0559) Pulse Rate:  [74-83] 74 (10/13 0559) Resp:  [18] 18 (10/13 0559) BP: (115-208)/(47-76) 115/51 (10/13 0559) SpO2:  [78 %-100 %] 95 % (10/13 1225)  Weight change:  Filed Weights   01/29/18 1605 01/30/18 0513  Weight: 59 kg 55.4 kg    Intake/Output: I/O last 3 completed shifts: In: 539.3 [I.V.:227.3; IV Piggyback:312] Out: 0    Intake/Output this shift:  Total I/O In: 240 [P.O.:240] Out: 0   Physical Exam: General: NAD, sitting up in chair  Head: Normocephalic, atraumatic. Moist oral mucosal membranes  Eyes: Anicteric   Neck: Supple,    Lungs:  Clear to auscultation  Heart: Regular rate and rhythm  Abdomen:  Soft, nontender,   Extremities:  left first toe with ischemic changes, discoloration on dorsal aspect of foot  Neurologic: Alert, oreinted  Access: PD catheter, RIJ permcath    Basic Metabolic Panel: Recent Labs  Lab 01/29/18 1722 01/30/18 0627 01/30/18 1345  NA 135 135 139  K 4.9 6.1* 3.2*  CL 91* 93* 98  CO2 26 25 27   GLUCOSE 216* 195* 122*  BUN 93* 97* 35*  CREATININE 15.41* 16.71* 6.72*  CALCIUM 8.6* 8.2* 8.2*    Liver Function Tests: No results for input(s): AST, ALT, ALKPHOS, BILITOT, PROT, ALBUMIN in the last 168  hours. No results for input(s): LIPASE, AMYLASE in the last 168 hours. No results for input(s): AMMONIA in the last 168 hours.  CBC: Recent Labs  Lab 01/29/18 1722 01/30/18 0627 01/31/18 0351 02/01/18 0324  WBC 13.2* 16.7* 14.9* 18.5*  HGB 13.2 11.9* 10.6* 10.3*  HCT 41.8 39.0 35.1* 34.0*  MCV 95.2 97.5 99.2 98.0  PLT 180 156 157 167    Cardiac Enzymes: No results for input(s): CKTOTAL, CKMB, CKMBINDEX, TROPONINI in the last 168 hours.  BNP: Invalid input(s): POCBNP  CBG: Recent Labs  Lab 01/31/18 1634 01/31/18 2003 01/31/18 2200 02/01/18 0743 02/01/18 1142  GLUCAP 196* 207* 213* 143* 109*    Microbiology: Results for orders placed or performed during the hospital encounter of 01/29/18  Blood Culture (routine x 2)     Status: Abnormal   Collection Time: 01/29/18  9:55 PM  Result Value Ref Range Status   Specimen Description   Final    BLOOD RIGHT ANTECUBITAL Performed at [facility], [address], [place], [place] [address]    Special Requests   Final    BOTTLES DRAWN AEROBIC AND ANAEROBIC Blood Culture adequate volume Performed at [facility], [address] [place], [place] [address]    Culture  Setup Time   Final    GRAM POSITIVE COCCI IN CLUSTERS IN BOTH AEROBIC AND ANAEROBIC BOTTLES CRITICAL RESULT CALLED TO, READ BACK BY AND VERIFIED WITH: KAREN HAYES  01/30/18 0839 JGF Performed at [facility], [address] [place], [place] [address]    Culture (A)  Final    STAPHYLOCOCCUS AUREUS SUSCEPTIBILITIES PERFORMED ON PREVIOUS CULTURE WITHIN THE LAST 5 DAYS. Performed at [facility], [place] [address], [place], [place] [address]    Report Status 02/01/2018 FINAL  Final  Blood Culture (routine x 2)     Status: Abnormal   Collection Time: 01/29/18  9:55 PM  Result Value Ref Range Status   Specimen Description   Final    BLOOD RIGHT ANTECUBITAL Performed at [facility], [address], [place],  [place] [address]    Special Requests   Final    BOTTLES DRAWN AEROBIC AND ANAEROBIC Blood Culture adequate volume Performed at [facility], [address] [place], [place] [address]    Culture  Setup Time   Final    GRAM POSITIVE COCCI IN CLUSTERS IN BOTH AEROBIC AND ANAEROBIC BOTTLES CRITICAL RESULT CALLED TO, READ BACK BY AND VERIFIED WITH: KAREN HAYES 01/30/18 0839 JGF Performed at [facility], [place] [address] [place], [place] [address]    Culture STAPHYLOCOCCUS AUREUS (A)  Final   Report Status 02/01/2018 FINAL  Final   Organism ID, Bacteria STAPHYLOCOCCUS AUREUS  Final      Susceptibility   Staphylococcus aureus - MIC*    CIPROFLOXACIN <=0.5 SENSITIVE Sensitive     ERYTHROMYCIN <=0.25 SENSITIVE Sensitive     GENTAMICIN <=0.5 SENSITIVE Sensitive     OXACILLIN 0.5 SENSITIVE Sensitive     TETRACYCLINE <=1 SENSITIVE Sensitive     VANCOMYCIN <=0.5 SENSITIVE Sensitive     TRIMETH/SULFA <=10 SENSITIVE Sensitive     CLINDAMYCIN <=0.25 SENSITIVE Sensitive     RIFAMPIN <=0.5 SENSITIVE Sensitive     Inducible Clindamycin NEGATIVE Sensitive     * STAPHYLOCOCCUS AUREUS  Blood Culture ID Panel (Reflexed)     Status: Abnormal   Collection Time: 01/29/18  9:55 PM  Result Value Ref Range Status   Enterococcus species NOT DETECTED NOT DETECTED Final   Vancomycin resistance NOT DETECTED NOT DETECTED Final   Listeria monocytogenes NOT DETECTED NOT DETECTED Final   Staphylococcus species DETECTED (A) NOT DETECTED Final    Comment: CRITICAL RESULT CALLED TO, READ BACK BY AND VERIFIED WITH: KAREN HAYES 01/30/18 0839 JGF    Staphylococcus aureus (BCID) DETECTED (A) NOT DETECTED Final    Comment: CRITICAL RESULT CALLED TO, READ BACK BY AND VERIFIED WITH: KAREN HAYES 01/30/18 0839 JGF Methicillin (oxacillin) susceptible Staphylococcus aureus (MSSA). Preferred therapy is anti staphylococcal beta lactam antibiotic (Cefazolin or Nafcillin), unless clinically contraindicated.     Methicillin resistance NOT DETECTED NOT DETECTED Final   Streptococcus species NOT DETECTED NOT DETECTED Final   Streptococcus agalactiae NOT DETECTED NOT DETECTED Final   Streptococcus pneumoniae NOT DETECTED NOT DETECTED Final   Streptococcus pyogenes NOT DETECTED NOT DETECTED Final   Acinetobacter baumannii NOT DETECTED NOT DETECTED Final   Enterobacteriaceae species NOT DETECTED NOT DETECTED Final   Enterobacter cloacae complex NOT DETECTED NOT DETECTED Final   Escherichia coli NOT DETECTED NOT DETECTED Final   Klebsiella oxytoca NOT DETECTED NOT DETECTED Final   Klebsiella pneumoniae NOT DETECTED NOT DETECTED Final   Proteus species NOT DETECTED NOT DETECTED Final   Serratia marcescens NOT DETECTED NOT DETECTED Final   Carbapenem resistance NOT DETECTED NOT DETECTED Final   Haemophilus influenzae NOT DETECTED NOT DETECTED Final   Neisseria meningitidis NOT DETECTED NOT DETECTED Final   Pseudomonas  aeruginosa NOT DETECTED NOT DETECTED Final   Candida albicans NOT DETECTED NOT DETECTED Final   Candida glabrata NOT DETECTED NOT DETECTED Final   Candida krusei NOT DETECTED NOT DETECTED Final   Candida parapsilosis NOT DETECTED NOT DETECTED Final   Candida tropicalis NOT DETECTED NOT DETECTED Final    Comment: Performed at [facility], [address] [place], [place] [address]  MRSA PCR Screening     Status: None   Collection Time: 01/29/18 10:55 PM  Result Value Ref Range Status   MRSA by PCR NEGATIVE NEGATIVE Final    Comment:        The GeneXpert MRSA Assay (FDA approved for NASAL specimens only), is one component of a comprehensive MRSA colonization surveillance program. It is not intended to diagnose MRSA infection nor to guide or monitor treatment for MRSA infections. Performed at [facility], [address] [place], [place] [address]   Culture, blood (Routine X 2) w Reflex to ID Panel     Status: None (Preliminary result)   Collection Time:  01/30/18  9:27 AM  Result Value Ref Range Status   Specimen Description BLOOD RIGHT ANTECUBITAL  Final   Special Requests   Final    BOTTLES DRAWN AEROBIC AND ANAEROBIC Blood Culture adequate volume   Culture   Final    NO GROWTH 2 DAYS Performed at [facility], [address], [place], [place] [address]    Report Status PENDING  Incomplete    Coagulation Studies: Recent Labs    01/29/18 06-29-34  LABPROT 14.5  INR 1.14    Urinalysis: No results for input(s): COLORURINE, LABSPEC, PHURINE, GLUCOSEU, HGBUR, BILIRUBINUR, KETONESUR, PROTEINUR, UROBILINOGEN, NITRITE, LEUKOCYTESUR in the last 72 hours.  Invalid input(s): APPERANCEUR    Imaging: No results found.   Medications:   . sodium chloride 10 mL/hr at 01/30/18 1532  .  ceFAZolin (ANCEF) IV Stopped (02/01/18 0238)  . heparin 850 Units/hr (02/01/18 0455)   . aspirin EC  81 mg Oral Daily  . atorvastatin  80 mg Oral QHS  . Atropine Sulfate-NaCl  1 drop Ophthalmic BID  . carvedilol  3.125 mg Oral BID WC  . Chlorhexidine Gluconate Cloth  6 each Topical Q0600  . cholecalciferol  400 Units Oral Once per day on Mon Wed Fri  . docusate sodium  100 mg Oral BID  . [START ON 02/02/2018] epoetin (EPOGEN/PROCRIT) injection  4,000 Units Intravenous Q M,W,F-HD  . famotidine  20 mg Oral Daily  . feeding supplement (NEPRO CARB STEADY)  237 mL Oral BID BM  . insulin aspart  0-5 Units Subcutaneous QHS  . insulin aspart  0-9 Units Subcutaneous TID WC  . irbesartan  75 mg Oral Daily  . isosorbide mononitrate  30 mg Oral Daily  . multivitamin  1 tablet Oral QHS  . multivitamin with minerals  1 tablet Oral Daily  . multivitamin-lutein  1 capsule Oral Daily  . sevelamer carbonate  800 mg Oral TID WC   acetaminophen **OR** acetaminophen, bisacodyl, metoCLOPramide (REGLAN) injection, morphine injection, nitroGLYCERIN, ondansetron **OR** ondansetron (ZOFRAN) IV, oxyCODONE-acetaminophen, traZODone  Assessment/ Plan:  Ms. Quanta Robertshaw is a 67 y.o. black female with end stage renal disease on peritoneal dialysis, hypertension, congestive heart failure, coronary artery disease status post CABG, diabetes mellitus type II insulin dependent, GERD, peripheral vascular disease admitted to [facility] on 01/29/2018 for Peripheral vascular disease ([place]) [I73.9] Right leg pain [M79.604] Left leg pain [M79.605] Ulcer of great toe, left, with unspecified severity (  [facility]) [facility] CCPD 10 hours 5 exchanges 2 liter fills   1. End Stage Renal Disease with hyperkalemia: missed several days of peritoneal dialysis.  Does not want to do PD anymore as outpatient - will continue Hemodialysis through [facility] M-W-F -We will schedule her next hemodialysis treatment on Monday  2. Peripheral vascular disease: left first toe with gangrene and pain.  - underwent limb salvage angioplasty on 01/31/11  3. Anemia of chronic kidney disease: hemoglobin 10.6.  EPO with HD  4. Secondary Hyperparathyroidism:  - sevelamer with meals.   5. Diabetes mellitus type II with chronic kidney disease: insulin dependent -  Continue glucose control     LOS: 3 Onyx Schirmer 10/13/201912:42 PM

## 2018-02-01 NOTE — Progress Notes (Signed)
[place] for heparin Indication: arterial embolism  Allergies  Allergen Reactions  . Chlorthalidone Anaphylaxis, Itching and Rash  . Fentanyl Rash  . Midazolam Rash  . Ace Inhibitors Other (See Comments)    Reaction:  Hyperkalemia, agitation   . Angiotensin Receptor Blockers Other (See Comments)    Reaction:  Hyperkalemia, agitation   . Norvasc [Amlodipine] Itching and Rash  . Phenergan [Promethazine Hcl] Anxiety    "antsy, can't sit still"    Patient Measurements: Height: 5' 4"  (162.6 cm) Weight: 122 lb 2.2 oz (55.4 kg) IBW/kg (Calculated) : 54.7 Heparin Dosing Weight: 55.4 kg  Vital Signs: Temp: 97.9 F (36.6 C) (10/13 1953) Temp Source: Oral (10/13 1953) BP: 108/51 (10/13 1953) Pulse Rate: 68 (10/13 1953)  Labs: Recent Labs    01/29/18 2136  01/30/18 0627 01/30/18 1345  01/31/18 0351 01/31/18 1307 02/01/18 0324 02/01/18 1200  HGB  --    < > 11.9*  --   --  10.6*  --  10.3*  --   HCT  --   --  39.0  --   --  35.1*  --  34.0*  --   PLT  --   --  156  --   --  157  --  167  --   APTT 28  --   --   --   --   --   --   --   --   LABPROT 14.5  --   --   --   --   --   --   --   --   INR 1.14  --   --   --   --   --   --   --   --   HEPARINUNFRC  --   --   --   --    < > 0.47 0.46 0.30 0.52  CREATININE  --   --  16.71* 6.72*  --   --   --   --   --    < > = values in this interval not displayed.    Estimated Creatinine Clearance: 7.1 mL/min (A) (by C-G formula based on SCr of 6.72 mg/dL (H)).   Medical History: Past Medical History:  Diagnosis Date  . Anemia   . Anorexia   . Bacteremia due to Pseudomonas   . CHF (congestive heart failure) (HCC)    has not happened recently  . Complication of anesthesia 01/2016   WITH LAST STENT 10/17. PASsED OUT AND HAD TO BE AWAKENED  . Coronary artery disease involving left main coronary artery 01/2015   [facility]: 70% LM, p-mLAD 50-60% (Resting FFR 0.75), mRCA 80-90%, ~40 Ost OM & D1   . Dysrhythmia   . ESRD (end stage renal disease) on dialysis [facility])    ESRD secondary to acute kidney failure s/p CABG  DIALYSIS M/W/F  . Essential hypertension   . GERD (gastroesophageal reflux disease)   . Heart murmur    no treatment  . Hypercholesterolemia   . Myocardial infarction ([place]) 2016  . 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 Marland Kitchen Haithcock: CABG x 3, LIMA to LAD, SVG to RCA, SVG to OM3, EVH  . Sinusitis 2019  . Type II diabetes mellitus with complication (HCC)    CAD    Medications:  Infusions:  . sodium chloride 10 mL/hr at 01/30/18 1532  .  ceFAZolin (ANCEF) IV Stopped (02/01/18 0238)  . heparin  850 Units/hr (02/01/18 0455)    Assessment: 74 yof now s/p peripheral vascular catheterization with arterial embolism. Pharmacy consulted to dose heparin. No initial bolus due to receiving heparin 5000 units IV x 1 in angio suite per Dr. Delana Meyer. Hemoglobin is trending down following limb salvage angioplasty 10/11 but stabilizing.Epo ordered with HD  Heparin was started at 800 units/hr 10/12 0351 HL 0.47:  10/12 1307 HL 0.46 10/13 0324 HL 0.30: rate increased to 850 units/hr 10/13 1200 HL 0.52 10/13 1957 HL 0.60  Goal of Therapy:  Heparin level 0.3-0.7 units/ml Monitor platelets by anticoagulation protocol: Yes   Plan: Second therapeutic HL received. Continue infusion at 850 units/hr and re-check anti-Xa in the morning with CBC  Dallie Piles, Pharm.D. Clinical Pharmacist 02/01/2018,8:24 PM

## 2018-02-01 NOTE — Evaluation (Signed)
Physical Therapy Evaluation Patient Details Name: Stephanie Ellis MRN: [id number] DOB: June 17, 1950 Today's Date: 02/01/2018   History of Present Illness  Patient is a 67 year old female admitted with sepsis and receiving a catheterization of L LE on 01/30/18.  PMH includes DMII, CABG x3, CAD, MI, ESRD, CAD, CHF, anorexia and anemia.  Clinical Impression  Pt is a 67 year old female who lives with her daughter.  She is independent with occasional use of RW at baseline.  Pt alert at beginning of evaluation but appeared more lethargic with progression. Pt also reported nausea as a result of ordering her lunch over the phone and movement during bed mobility.  Pt able to sit at EOB without assistance though very hesitant to place L LE on floor.  PT assisted pt minimally with bed mobility.  Due to nausea and lethargy, PT offered to assist pt to recliner with a squat pivot transfer and pt was able to assist with R LE and UE's.  She required mod A and appeared to be very fatigued following this transfer.  Pt accepting of WBAT status education but apprehensive in use of L LE.  Pt's family not present at conclusion of evaluation and pt very lethargic.  She did state that she is not comfortable with the level of care available in her home at this time.  Pt will continue to benefit from skilled PT with focus on strength, tolerance to activity, HEP for conditioning and fall prevention.    Follow Up Recommendations SNF    Equipment Recommendations  None recommended by PT    Recommendations for Other Services       Precautions / Restrictions Precautions Precautions: Fall Restrictions Weight Bearing Restrictions: Yes LLE Weight Bearing: Weight bearing as tolerated      Mobility  Bed Mobility Overal bed mobility: Needs Assistance Bed Mobility: Supine to Sit     Supine to sit: Min assist     General bed mobility comments: Very slow with mobility and guarded due to L LE pain.  PT provided hand held  assist to get to EOB.  Transfers Overall transfer level: Needs assistance Equipment used: 1 person hand held assist Transfers: Squat Pivot Transfers     Squat pivot transfers: Mod assist     General transfer comment: Pt able to assist with use of R LE and UE's.  Very hesistant to use L LE. PT explained WBAT status.  Pt nauseated and reported feeling weak throughout transfer.  Ambulation/Gait                Stairs            Wheelchair Mobility    Modified Rankin (Stroke Patients Only)       Balance Overall balance assessment: Needs assistance Sitting-balance support: Bilateral upper extremity supported Sitting balance-Leahy Scale: Fair     Standing balance support: Bilateral upper extremity supported Standing balance-Leahy Scale: Poor                               Pertinent Vitals/Pain Pain Assessment: Faces Faces Pain Scale: Hurts even more Pain Location: L LE with bed mobility Pain Intervention(s): Monitored during session    Home Living Family/patient expects to be discharged to:: Skilled nursing facility Living Arrangements: Children                    Prior Function Level of Independence: Independent with assistive device(s)  Comments: Ambulates with occasional use of RW.     Hand Dominance        Extremity/Trunk Assessment   Upper Extremity Assessment Upper Extremity Assessment: Generalized weakness    Lower Extremity Assessment Lower Extremity Assessment: Generalized weakness;LLE deficits/detail LLE: Unable to fully assess due to pain    Cervical / Trunk Assessment Cervical / Trunk Assessment: Kyphotic(Slightly kyphotic.)  Communication   Communication: (Pt very lethargic at end of evaluation.  Slow to respond and closing eyes intermittently.)  Cognition Arousal/Alertness: Lethargic Behavior During Therapy: WFL for tasks assessed/performed Overall Cognitive Status: Within Functional Limits for  tasks assessed                                 General Comments: A&O x4 at beginning of evaluation. Able to respond to verbal commands consistently.      General Comments      Exercises     Assessment/Plan    PT Assessment Patient needs continued PT services  PT Problem List Decreased strength;Decreased mobility;Decreased balance;Decreased knowledge of use of DME;Pain;Decreased activity tolerance       PT Treatment Interventions DME instruction;Therapeutic activities;Gait training;Therapeutic exercise;Patient/family education;Balance training;Functional mobility training;Neuromuscular re-education    PT Goals (Current goals can be found in the Care Plan section)  Acute Rehab PT Goals Patient Stated Goal: To resume daily general activity as independently as possible. PT Goal Formulation: With patient Time For Goal Achievement: 02/15/18 Potential to Achieve Goals: Fair    Frequency Min 2X/week   Barriers to discharge        Co-evaluation               AM-PAC PT "6 Clicks" Daily Activity  Outcome Measure Difficulty turning over in bed (including adjusting bedclothes, sheets and blankets)?: A Lot Difficulty moving from lying on back to sitting on the side of the bed? : A Lot Difficulty sitting down on and standing up from a chair with arms (e.g., wheelchair, bedside commode, etc,.)?: A Lot Help needed moving to and from a bed to chair (including a wheelchair)?: A Lot Help needed walking in hospital room?: Total Help needed climbing 3-5 steps with a railing? : Total 6 Click Score: 10    End of Session Equipment Utilized During Treatment: Oxygen Activity Tolerance: Patient limited by fatigue;Patient limited by pain Patient left: in chair;with chair alarm set;with call bell/phone within reach Nurse Communication: Mobility status PT Visit Diagnosis: Unsteadiness on feet (R26.81);Muscle weakness (generalized) (M62.81)    Time: 3748-2707 PT Time  Calculation (min) (ACUTE ONLY): 31 min   Charges:   PT Evaluation $PT Eval Moderate Complexity: 1 Mod          Roxanne Gates, PT, DPT   Roxanne Gates 02/01/2018, 12:35 PM

## 2018-02-01 NOTE — Progress Notes (Signed)
[place] for heparin Indication: arterial embolism  Allergies  Allergen Reactions  . Chlorthalidone Anaphylaxis, Itching and Rash  . Fentanyl Rash  . Midazolam Rash  . Ace Inhibitors Other (See Comments)    Reaction:  Hyperkalemia, agitation   . Angiotensin Receptor Blockers Other (See Comments)    Reaction:  Hyperkalemia, agitation   . Norvasc [Amlodipine] Itching and Rash  . Phenergan [Promethazine Hcl] Anxiety    "antsy, can't sit still"    Patient Measurements: Height: 5' 4"  (162.6 cm) Weight: 122 lb 2.2 oz (55.4 kg) IBW/kg (Calculated) : 54.7 Heparin Dosing Weight: 55.4 kg  Vital Signs: Temp: 97.3 F (36.3 C) (10/12 2004) Temp Source: Oral (10/12 2004) BP: 170/69 (10/12 2040) Pulse Rate: 74 (10/12 2040)  Labs: Recent Labs    01/29/18 1722 01/29/18 2136 01/30/18 0627 01/30/18 1345 01/31/18 0351 01/31/18 1307 02/01/18 0324  HGB 13.2  --  11.9*  --  10.6*  --  10.3*  HCT 41.8  --  39.0  --  35.1*  --  34.0*  PLT 180  --  156  --  157  --  167  APTT  --  28  --   --   --   --   --   LABPROT  --  14.5  --   --   --   --   --   INR  --  1.14  --   --   --   --   --   HEPARINUNFRC  --   --   --   --  0.47 0.46 0.30  CREATININE 15.41*  --  16.71* 6.72*  --   --   --     Estimated Creatinine Clearance: 7.1 mL/min (A) (by C-G formula based on SCr of 6.72 mg/dL (H)).   Medical History: Past Medical History:  Diagnosis Date  . Anemia   . Anorexia   . Bacteremia due to Pseudomonas   . CHF (congestive heart failure) (HCC)    has not happened recently  . Complication of anesthesia 01/2016   WITH LAST STENT 10/17. PASsED OUT AND HAD TO BE AWAKENED  . Coronary artery disease involving left main coronary artery 01/2015   [facility]: 70% LM, p-mLAD 50-60% (Resting FFR 0.75), mRCA 80-90%, ~40 Ost OM & D1  . Dysrhythmia   . ESRD (end stage renal disease) on dialysis [facility])    ESRD secondary to acute kidney failure s/p CABG  DIALYSIS  M/W/F  . Essential hypertension   . GERD (gastroesophageal reflux disease)   . Heart murmur    no treatment  . Hypercholesterolemia   . Myocardial infarction ([place]) 2016  . 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 Marland Kitchen Haithcock: CABG x 3, LIMA to LAD, SVG to RCA, SVG to OM3, EVH  . Sinusitis 2019  . Type II diabetes mellitus with complication (HCC)    CAD    Medications:  Infusions:  . sodium chloride 10 mL/hr at 01/30/18 1532  .  ceFAZolin (ANCEF) IV 1 g (02/01/18 0213)  . heparin 800 Units/hr (01/31/18 1159)    Assessment: 11 yof now s/p peripheral vascular catheterization with arterial embolism. Pharmacy consulted to dose heparin. No initial bolus due to receiving heparin 5000 units IV x 1 in angio suite per Dr. Delana Meyer. Hemoglobin is trending down following limb salvage angioplasty 10/11. No s/s of bleeding. Epo ordered with HD  Heparin was started at 800 units/hr 10/12 0351 HL  0.47: rate continued 10/12 1307 HL 0.46  Goal of Therapy:  Heparin level 0.3-0.7 units/ml Monitor platelets by anticoagulation protocol: Yes   Plan:  10/13 @ 0330 HL 0.30 therapeutic but trending down from 0.47 >> 0.46. Will increase rate slightly to 850 units/hr and will recheck HL @ 1000, CBC stable.  Tobie Lords, Pharm.D. Clinical Pharmacist 02/01/2018,4:34 AM

## 2018-02-02 ENCOUNTER — Encounter: Payer: Self-pay | Admitting: Vascular Surgery

## 2018-02-02 ENCOUNTER — Encounter: Admission: RE | Payer: Self-pay | Source: Ambulatory Visit

## 2018-02-02 ENCOUNTER — Ambulatory Visit: Admission: RE | Admit: 2018-02-02 | Payer: Medicare Other | Source: Ambulatory Visit | Admitting: Vascular Surgery

## 2018-02-02 DIAGNOSIS — E1169 Type 2 diabetes mellitus with other specified complication: Secondary | ICD-10-CM

## 2018-02-02 DIAGNOSIS — M869 Osteomyelitis, unspecified: Secondary | ICD-10-CM

## 2018-02-02 DIAGNOSIS — I739 Peripheral vascular disease, unspecified: Secondary | ICD-10-CM

## 2018-02-02 DIAGNOSIS — Z515 Encounter for palliative care: Secondary | ICD-10-CM

## 2018-02-02 DIAGNOSIS — Z7189 Other specified counseling: Secondary | ICD-10-CM

## 2018-02-02 LAB — CBC
HCT: 29.3 % — ABNORMAL LOW (ref 36.0–46.0)
Hemoglobin: 9 g/dL — ABNORMAL LOW (ref 12.0–15.0)
MCH: 29.9 pg (ref 26.0–34.0)
MCHC: 30.7 g/dL (ref 30.0–36.0)
MCV: 97.3 fL (ref 80.0–100.0)
Platelets: 170 10*3/uL (ref 150–400)
RBC: 3.01 MIL/uL — ABNORMAL LOW (ref 3.87–5.11)
RDW: 19.3 % — ABNORMAL HIGH (ref 11.5–15.5)
WBC: 19.9 10*3/uL — ABNORMAL HIGH (ref 4.0–10.5)
nRBC: 0 % (ref 0.0–0.2)

## 2018-02-02 LAB — HEPARIN LEVEL (UNFRACTIONATED): Heparin Unfractionated: 0.59 IU/mL (ref 0.30–0.70)

## 2018-02-02 LAB — PHOSPHORUS: Phosphorus: 9.1 mg/dL — ABNORMAL HIGH (ref 2.5–4.6)

## 2018-02-02 LAB — GLUCOSE, CAPILLARY
Glucose-Capillary: 87 mg/dL (ref 70–99)
Glucose-Capillary: 78 mg/dL (ref 70–99)
Glucose-Capillary: 109 mg/dL — ABNORMAL HIGH (ref 70–99)
Glucose-Capillary: 203 mg/dL — ABNORMAL HIGH (ref 70–99)
Glucose-Capillary: 159 mg/dL — ABNORMAL HIGH (ref 70–99)

## 2018-02-02 SURGERY — LOWER EXTREMITY ANGIOGRAPHY
Anesthesia: Moderate Sedation | Laterality: Left

## 2018-02-02 MED ORDER — OXYCODONE HCL 5 MG PO TABS
5.0000 mg | ORAL_TABLET | ORAL | Status: DC | PRN
  Administered 2018-02-08 – 2018-02-13 (×2): 5 mg via ORAL
  Filled 2018-02-02 (×2): qty 1

## 2018-02-02 MED ORDER — HYDROMORPHONE HCL 1 MG/ML IJ SOLN
0.5000 mg | INTRAMUSCULAR | Status: DC | PRN
  Administered 2018-02-10: 0.5 mg via INTRAVENOUS
  Filled 2018-02-02: qty 0.5

## 2018-02-02 MED ORDER — ACETAMINOPHEN 325 MG PO TABS
650.0000 mg | ORAL_TABLET | Freq: Three times a day (TID) | ORAL | Status: DC
  Administered 2018-02-02 – 2018-02-15 (×25): 650 mg via ORAL
  Filled 2018-02-02 (×31): qty 2

## 2018-02-02 NOTE — Progress Notes (Signed)
Post HD assessment. Pt tolerated tx well without c/o or complication. Net UF 1507, goal met.    02/02/18 1332  Vital Signs  Temp 97.8 F (36.6 C)  Temp Source Oral  Pulse Rate 76  Pulse Rate Source Monitor  Resp 11  BP 99/87  BP Location Right Arm  BP Method Automatic  Patient Position (if appropriate) Lying  Oxygen Therapy  SpO2 100 %  O2 Device Nasal Cannula  O2 Flow Rate (L/min) 4 L/min  Dialysis Weight  Weight 58 kg  Type of Weight Post-Dialysis  Post-Hemodialysis Assessment  Rinseback Volume (mL) 250 mL  KECN 77.6 V  Dialyzer Clearance Lightly streaked  Duration of HD Treatment -hour(s) 3.5 hour(s)  Hemodialysis Intake (mL) 500 mL  UF Total -Machine (mL) 2007 mL  Net UF (mL) 1507 mL  Tolerated HD Treatment Yes  Education / Care Plan  Dialysis Education Provided Yes  Documented Education in Care Plan Yes  Hemodialysis Catheter Right Subclavian  No Placement Date or Time found.   Placed prior to admission: Yes  Orientation: Right  Access Location: Subclavian  Site Condition No complications  Blue Lumen Status Heparin locked  Red Lumen Status Heparin locked  Purple Lumen Status N/A  Catheter fill solution Heparin 1000 units/ml  Catheter fill volume (Arterial) 1.8 cc  Catheter fill volume (Venous) 1.9  Dressing Type Biopatch  Dressing Status Clean;Dry;Intact;Dressing reinforced  Interventions Dressing reinforced  Drainage Description None  Post treatment catheter status Capped and Clamped

## 2018-02-02 NOTE — Progress Notes (Signed)
HD tx start    02/02/18 0950  Vital Signs  Pulse Rate 67  Pulse Rate Source Monitor  Resp 18  BP (!) 113/41  BP Location Right Arm  BP Method Automatic  Patient Position (if appropriate) Lying  Oxygen Therapy  SpO2 100 %  O2 Device Nasal Cannula  O2 Flow Rate (L/min) 4 L/min  During Hemodialysis Assessment  Blood Flow Rate (mL/min) 400 mL/min  Arterial Pressure (mmHg) -190 mmHg  Venous Pressure (mmHg) 220 mmHg  Transmembrane Pressure (mmHg) 50 mmHg  Ultrafiltration Rate (mL/min) 570 mL/min  Dialysate Flow Rate (mL/min) 800 ml/min  Conductivity: Machine  14  HD Safety Checks Performed Yes  Dialysis Fluid Bolus Normal Saline  Bolus Amount (mL) 250 mL  Intra-Hemodialysis Comments Tx initiated  Hemodialysis Catheter Right Subclavian  No Placement Date or Time found.   Placed prior to admission: Yes  Orientation: Right  Access Location: Subclavian  Blue Lumen Status Infusing  Red Lumen Status Infusing

## 2018-02-02 NOTE — Progress Notes (Signed)
[facility]  ROUNDING NOTE   Subjective:   Stephanie Ellis admitted to [facility] on 01/29/2018 for Peripheral vascular disease [facility]) [I73.9] Right leg pain [M79.604] Left leg pain [M79.605] Ulcer of great toe, left, with unspecified severity ([place]) [L97.529]   Patient seen during hemodialysis.  Tolerating well. Appetite remains very poor\  Objective:  Vital signs in last 24 hours:  Temp:  [97.9 F (36.6 C)-98.6 F (37 C)] 98.4 F (36.9 C) (10/14 0941) Pulse Rate:  [65-72] 69 (10/14 1100) Resp:  [9-20] 16 (10/14 1100) BP: (90-133)/(40-54) 103/43 (10/14 1100) SpO2:  [94 %-100 %] 97 % (10/14 1100) Weight:  [59.4 kg-59.6 kg] 59.6 kg (10/14 0941)  Weight change:  Filed Weights   01/30/18 0513 02/02/18 0455 02/02/18 0941  Weight: 55.4 kg 59.4 kg 59.6 kg    Intake/Output: I/O last 3 completed shifts: In: 389.8 [P.O.:240; I.V.:110; IV Piggyback:39.8] Out: 0    Intake/Output this shift:  Total I/O In: 395.2 [P.O.:120; I.V.:235.6; IV Piggyback:39.6] Out: -   Physical Exam: General: NAD, laying in bed, chronically ill-appearing  Head: Normocephalic, atraumatic. Moist oral mucosal membranes  Eyes: Anicteric   Neck: Supple,    Lungs:  Clear to auscultation  Heart: Regular rate and rhythm  Abdomen:  Soft, nontender,   Extremities:  left first toe with ischemic changes, discoloration on dorsal aspect of foot  Neurologic: Alert, oreinted  Access: PD catheter, RIJ permcath    Basic Metabolic Panel: Recent Labs  Lab 01/29/18 1722 01/30/18 0627 01/30/18 1345 02/02/18 0410  NA 135 135 139  --   K 4.9 6.1* 3.2*  --   CL 91* 93* 98  --   CO2 26 25 27   --   GLUCOSE 216* 195* 122*  --   BUN 93* 97* 35*  --   CREATININE 15.41* 16.71* 6.72*  --   CALCIUM 8.6* 8.2* 8.2*  --   PHOS  --   --   --  9.1*    Liver Function Tests: No results for input(s): AST, ALT, ALKPHOS, BILITOT, PROT, ALBUMIN in the last 168 hours. No results for input(s): LIPASE, AMYLASE in the  last 168 hours. No results for input(s): AMMONIA in the last 168 hours.  CBC: Recent Labs  Lab 01/29/18 1722 01/30/18 0627 01/31/18 0351 02/01/18 0324 02/02/18 0410  WBC 13.2* 16.7* 14.9* 18.5* 19.9*  HGB 13.2 11.9* 10.6* 10.3* 9.0*  HCT 41.8 39.0 35.1* 34.0* 29.3*  MCV 95.2 97.5 99.2 98.0 97.3  PLT 180 156 157 167 170    Cardiac Enzymes: No results for input(s): CKTOTAL, CKMB, CKMBINDEX, TROPONINI in the last 168 hours.  BNP: Invalid input(s): POCBNP  CBG: Recent Labs  Lab 02/01/18 0743 02/01/18 1142 02/01/18 1638 02/01/18 2128 02/02/18 0737  GLUCAP 143* 109* 186* 176* 58    Microbiology: Results for orders placed or performed during the hospital encounter of 01/29/18  Blood Culture (routine x 2)     Status: Abnormal   Collection Time: 01/29/18  9:55 PM  Result Value Ref Range Status   Specimen Description   Final    BLOOD RIGHT ANTECUBITAL Performed at [facility], [address] [place], [place] [address]    Special Requests   Final    BOTTLES DRAWN AEROBIC AND ANAEROBIC Blood Culture adequate volume Performed at [facility], [address] [place], [place] [address]    Culture  Setup Time   Final    GRAM POSITIVE COCCI IN CLUSTERS IN BOTH AEROBIC AND ANAEROBIC  BOTTLES CRITICAL RESULT CALLED TO, READ BACK BY AND VERIFIED WITH: KAREN HAYES 01/30/18 0839 JGF Performed at [facility], [address] [place], [place] [address]    Culture (A)  Final    STAPHYLOCOCCUS AUREUS SUSCEPTIBILITIES PERFORMED ON PREVIOUS CULTURE WITHIN THE LAST 5 DAYS. Performed at [facility], [place] [address], [place], [place] [address]    Report Status 02/01/2018 FINAL  Final  Blood Culture (routine x 2)     Status: Abnormal   Collection Time: 01/29/18  9:55 PM  Result Value Ref Range Status   Specimen Description   Final    BLOOD RIGHT ANTECUBITAL Performed at [facility], [address], [place], [place] [address]     Special Requests   Final    BOTTLES DRAWN AEROBIC AND ANAEROBIC Blood Culture adequate volume Performed at [facility], [address] [place], [place] [address]    Culture  Setup Time   Final    GRAM POSITIVE COCCI IN CLUSTERS IN BOTH AEROBIC AND ANAEROBIC BOTTLES CRITICAL RESULT CALLED TO, READ BACK BY AND VERIFIED WITH: KAREN HAYES 01/30/18 0839 JGF Performed at [facility], [place] [address], [place], [place] [address]    Culture STAPHYLOCOCCUS AUREUS (A)  Final   Report Status 02/01/2018 FINAL  Final   Organism ID, Bacteria STAPHYLOCOCCUS AUREUS  Final      Susceptibility   Staphylococcus aureus - MIC*    CIPROFLOXACIN <=0.5 SENSITIVE Sensitive     ERYTHROMYCIN <=0.25 SENSITIVE Sensitive     GENTAMICIN <=0.5 SENSITIVE Sensitive     OXACILLIN 0.5 SENSITIVE Sensitive     TETRACYCLINE <=1 SENSITIVE Sensitive     VANCOMYCIN <=0.5 SENSITIVE Sensitive     TRIMETH/SULFA <=10 SENSITIVE Sensitive     CLINDAMYCIN <=0.25 SENSITIVE Sensitive     RIFAMPIN <=0.5 SENSITIVE Sensitive     Inducible Clindamycin NEGATIVE Sensitive     * STAPHYLOCOCCUS AUREUS  Blood Culture ID Panel (Reflexed)     Status: Abnormal   Collection Time: 01/29/18  9:55 PM  Result Value Ref Range Status   Enterococcus species NOT DETECTED NOT DETECTED Final   Vancomycin resistance NOT DETECTED NOT DETECTED Final   Listeria monocytogenes NOT DETECTED NOT DETECTED Final   Staphylococcus species DETECTED (A) NOT DETECTED Final    Comment: CRITICAL RESULT CALLED TO, READ BACK BY AND VERIFIED WITH: KAREN HAYES 01/30/18 0839 JGF    Staphylococcus aureus (BCID) DETECTED (A) NOT DETECTED Final    Comment: CRITICAL RESULT CALLED TO, READ BACK BY AND VERIFIED WITH: KAREN HAYES 01/30/18 0839 JGF Methicillin (oxacillin) susceptible Staphylococcus aureus (MSSA). Preferred therapy is anti staphylococcal beta lactam antibiotic (Cefazolin or Nafcillin), unless clinically contraindicated.    Methicillin resistance  NOT DETECTED NOT DETECTED Final   Streptococcus species NOT DETECTED NOT DETECTED Final   Streptococcus agalactiae NOT DETECTED NOT DETECTED Final   Streptococcus pneumoniae NOT DETECTED NOT DETECTED Final   Streptococcus pyogenes NOT DETECTED NOT DETECTED Final   Acinetobacter baumannii NOT DETECTED NOT DETECTED Final   Enterobacteriaceae species NOT DETECTED NOT DETECTED Final   Enterobacter cloacae complex NOT DETECTED NOT DETECTED Final   Escherichia coli NOT DETECTED NOT DETECTED Final   Klebsiella oxytoca NOT DETECTED NOT DETECTED Final   Klebsiella pneumoniae NOT DETECTED NOT DETECTED Final   Proteus species NOT DETECTED NOT DETECTED Final   Serratia marcescens NOT DETECTED NOT DETECTED Final   Carbapenem resistance NOT DETECTED NOT DETECTED Final   Haemophilus influenzae NOT DETECTED NOT DETECTED  Final   Neisseria meningitidis NOT DETECTED NOT DETECTED Final   Pseudomonas aeruginosa NOT DETECTED NOT DETECTED Final   Candida albicans NOT DETECTED NOT DETECTED Final   Candida glabrata NOT DETECTED NOT DETECTED Final   Candida krusei NOT DETECTED NOT DETECTED Final   Candida parapsilosis NOT DETECTED NOT DETECTED Final   Candida tropicalis NOT DETECTED NOT DETECTED Final    Comment: Performed at [facility], [address] [place], [place] [address]  MRSA PCR Screening     Status: None   Collection Time: 01/29/18 10:55 PM  Result Value Ref Range Status   MRSA by PCR NEGATIVE NEGATIVE Final    Comment:        The GeneXpert MRSA Assay (FDA approved for NASAL specimens only), is one component of a comprehensive MRSA colonization surveillance program. It is not intended to diagnose MRSA infection nor to guide or monitor treatment for MRSA infections. Performed at [facility], [address] [place], [place] [address]   Culture, blood (Routine X 2) w Reflex to ID Panel     Status: None (Preliminary result)   Collection Time: 01/30/18  9:27 AM  Result  Value Ref Range Status   Specimen Description BLOOD RIGHT ANTECUBITAL  Final   Special Requests   Final    BOTTLES DRAWN AEROBIC AND ANAEROBIC Blood Culture adequate volume   Culture   Final    NO GROWTH 3 DAYS Performed at [facility], [address] [place], [place] [address]    Report Status PENDING  Incomplete    Coagulation Studies: No results for input(s): LABPROT, INR in the last 72 hours.  Urinalysis: No results for input(s): COLORURINE, LABSPEC, PHURINE, GLUCOSEU, HGBUR, BILIRUBINUR, KETONESUR, PROTEINUR, UROBILINOGEN, NITRITE, LEUKOCYTESUR in the last 72 hours.  Invalid input(s): APPERANCEUR    Imaging: No results found.   Medications:   . sodium chloride 10 mL/hr at 01/30/18 1532  .  ceFAZolin (ANCEF) IV Stopped (02/01/18 2309)  . heparin 850 Units/hr (02/02/18 2440)   . aspirin EC  81 mg Oral Daily  . atorvastatin  80 mg Oral QHS  . Atropine Sulfate-NaCl  1 drop Ophthalmic BID  . carvedilol  3.125 mg Oral BID WC  . Chlorhexidine Gluconate Cloth  6 each Topical Q0600  . cholecalciferol  400 Units Oral Once per day on Mon Wed Fri  . docusate sodium  100 mg Oral BID  . epoetin (EPOGEN/PROCRIT) injection  4,000 Units Intravenous Q M,W,F-HD  . famotidine  20 mg Oral Daily  . feeding supplement (NEPRO CARB STEADY)  237 mL Oral BID BM  . insulin aspart  0-5 Units Subcutaneous QHS  . insulin aspart  0-9 Units Subcutaneous TID WC  . irbesartan  75 mg Oral Daily  . isosorbide mononitrate  30 mg Oral Daily  . multivitamin  1 tablet Oral QHS  . multivitamin with minerals  1 tablet Oral Daily  . multivitamin-lutein  1 capsule Oral Daily  . sevelamer carbonate  800 mg Oral TID WC   acetaminophen **OR** acetaminophen, bisacodyl, metoCLOPramide (REGLAN) injection, morphine injection, nitroGLYCERIN, ondansetron **OR** ondansetron (ZOFRAN) IV, oxyCODONE-acetaminophen, traZODone  Assessment/ Plan:  Stephanie Ellis is a 47 y.o. black female with end stage  renal disease on peritoneal dialysis, hypertension, congestive heart failure, coronary artery disease status post CABG, diabetes mellitus type II insulin dependent, GERD, peripheral vascular disease admitted to [facility] on 01/29/2018 for Peripheral vascular disease ([place]) [I73.9] Right leg pain [M79.604] Left leg pain [M79.605] Ulcer of  great toe, left, with unspecified severity [facility]) [L97.529]   [facility] CCPD 10 hours 5 exchanges 2 liter fills   1. End Stage Renal Disease with hyperkalemia: missed several days of peritoneal dialysis.  Does not want to do PD anymore as outpatient - will continue Hemodialysis through [facility] M-W-F - will need to be scheduled for outpatient hemodialysis - Try dialysis in chair on wednseday  2. Peripheral vascular disease: left first toe with gangrene and pain.  - underwent limb salvage angioplasty on 01/31/11  3. Anemia of chronic kidney disease: hemoglobin 9.0.  EPO with HD  4. Secondary Hyperparathyroidism:  - sevelamer with meals.  - Hold for now because appetite is poor  5. 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

## 2018-02-02 NOTE — Progress Notes (Signed)
Physical Therapy Treatment Patient Details Name: Stephanie Ellis MRN: [id number] DOB: [birth year] Today's Date: 02/02/2018    History of Present Illness Patient is a 67 year old female admitted with sepsis and receiving a catheterization of L LE on 01/30/18.  PMH includes DMII, CABG x3, CAD, MI, ESRD, CAD, CHF, anorexia and anemia.    PT Comments    Pt initially refuses PT(fatigued from HD), but with gentle encouragement agreeable. Pt has significant pain in LLE about foot/ankle especially with ankle movement. Dr Lucky Cowboy in during session (15 min non billed time). MD clarifies for pt that she may use LLE (foot/ankle) as pain allows and encourages movement to her ability. MD also states that pt does not have any weight bearing restrictions. Pt participates in supine exercises with assist as needed. HEP provided and educated to family as well to participate throughout the day. Continue PT progressing strength/movement as able to improve functional mobility.    Follow Up Recommendations  SNF     Equipment Recommendations       Recommendations for Other Services       Precautions / Restrictions Precautions Precautions: Fall Restrictions Weight Bearing Restrictions: No(Per Dr. Lucky Cowboy)    Mobility  Bed Mobility               General bed mobility comments: Not tested due to pain/fatigue  Transfers                    Ambulation/Gait                 Stairs             Wheelchair Mobility    Modified Rankin (Stroke Patients Only)       Balance                                            Cognition Arousal/Alertness: Awake/alert Behavior During Therapy: WFL for tasks assessed/performed Overall Cognitive Status: Within Functional Limits for tasks assessed                                        Exercises General Exercises - Lower Extremity Ankle Circles/Pumps: AROM;Right;20 reps;Supine(too painful on L) Quad Sets:  Strengthening;Both;15 reps;Supine Gluteal Sets: Strengthening;Both;15 reps;Supine Short Arc Quad: AROM;Both;15 reps;Supine Heel Slides: AROM;Both;15 reps;Supine Hip ABduction/ADduction: AROM;Both;15 reps;Supine Straight Leg Raises: AROM;Both;15 reps;Supine Other Exercises Other Exercises: Family educated on HEP and written program provided    General Comments        Pertinent Vitals/Pain Pain Assessment: Faces Faces Pain Scale: Hurts whole lot Pain Location: LLE Pain Intervention(s): Monitored during session;Limited activity within patient's tolerance    Home Living                      Prior Function            PT Goals (current goals can now be found in the care plan section) Progress towards PT goals: Progressing toward goals(slowly)    Frequency    Min 2X/week      PT Plan Current plan remains appropriate    Co-evaluation              AM-PAC PT "6 Clicks" Daily Activity  Outcome Measure  Difficulty turning over in bed (including  adjusting bedclothes, sheets and blankets)?: A Lot Difficulty moving from lying on back to sitting on the side of the bed? : A Lot Difficulty sitting down on and standing up from a chair with arms (e.g., wheelchair, bedside commode, etc,.)?: Unable Help needed moving to and from a bed to chair (including a wheelchair)?: A Lot Help needed walking in hospital room?: Total Help needed climbing 3-5 steps with a railing? : Total 6 Click Score: 9    End of Session Equipment Utilized During Treatment: Oxygen Activity Tolerance: Patient limited by fatigue;Patient limited by pain Patient left: in bed;with call bell/phone within reach;with bed alarm set;with family/visitor present   PT Visit Diagnosis: Unsteadiness on feet (R26.81);Muscle weakness (generalized) (M62.81)     Time: 4758-3074(60 min non billed for MD visit) PT Time Calculation (min) (ACUTE ONLY): 43 min  Charges:  $Therapeutic Exercise: 23-37 mins                       Larae Grooms, PTA 02/02/2018, 4:09 PM

## 2018-02-02 NOTE — Care Management Important Message (Signed)
Copy of signed IM left in patient's room (out for procedure). 

## 2018-02-02 NOTE — Progress Notes (Signed)
[place] for heparin Indication: arterial embolism  Allergies  Allergen Reactions  . Chlorthalidone Anaphylaxis, Itching and Rash  . Fentanyl Rash  . Midazolam Rash  . Ace Inhibitors Other (See Comments)    Reaction:  Hyperkalemia, agitation   . Angiotensin Receptor Blockers Other (See Comments)    Reaction:  Hyperkalemia, agitation   . Norvasc [Amlodipine] Itching and Rash  . Phenergan [Promethazine Hcl] Anxiety    "antsy, can't sit still"    Patient Measurements: Height: 5' 4"  (162.6 cm) Weight: 130 lb 15.3 oz (59.4 kg) IBW/kg (Calculated) : 54.7 Heparin Dosing Weight: 55.4 kg  Vital Signs: Temp: 98.6 F (37 C) (10/14 0455) Temp Source: Oral (10/14 0455) BP: 133/54 (10/14 0455) Pulse Rate: 71 (10/14 0455)  Labs: Recent Labs    01/30/18 0627 01/30/18 1345 01/31/18 0351  02/01/18 0324 02/01/18 1200 02/01/18 1957 02/02/18 0410  HGB 11.9*  --  10.6*  --  10.3*  --   --  9.0*  HCT 39.0  --  35.1*  --  34.0*  --   --  29.3*  PLT 156  --  157  --  167  --   --  170  HEPARINUNFRC  --   --  0.47   < > 0.30 0.52 0.60 0.59  CREATININE 16.71* 6.72*  --   --   --   --   --   --    < > = values in this interval not displayed.    Estimated Creatinine Clearance: 7.1 mL/min (A) (by C-G formula based on SCr of 6.72 mg/dL (H)).   Medical History: Past Medical History:  Diagnosis Date  . Anemia   . Anorexia   . Bacteremia due to Pseudomonas   . CHF (congestive heart failure) (HCC)    has not happened recently  . Complication of anesthesia 01/2016   WITH LAST STENT 10/17. PASsED OUT AND HAD TO BE AWAKENED  . Coronary artery disease involving left main coronary artery 01/2015   [facility]: 70% LM, p-mLAD 50-60% (Resting FFR 0.75), mRCA 80-90%, ~40 Ost OM & D1  . Dysrhythmia   . ESRD (end stage renal disease) on dialysis [facility])    ESRD secondary to acute kidney failure s/p CABG  DIALYSIS M/W/F  . Essential hypertension   . GERD  (gastroesophageal reflux disease)   . Heart murmur    no treatment  . Hypercholesterolemia   . Myocardial infarction ([place]) 2016  . 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 Marland Kitchen Haithcock: CABG x 3, LIMA to LAD, SVG to RCA, SVG to OM3, EVH  . Sinusitis 2019  . Type II diabetes mellitus with complication (HCC)    CAD    Medications:  Infusions:  . sodium chloride 10 mL/hr at 01/30/18 1532  .  ceFAZolin (ANCEF) IV 1 g (02/01/18 2221)  . heparin 850 Units/hr (02/01/18 0455)    Assessment: 20 yof now s/p peripheral vascular catheterization with arterial embolism. Pharmacy consulted to dose heparin. No initial bolus due to receiving heparin 5000 units IV x 1 in angio suite per Dr. Delana Meyer. Hemoglobin is trending down following limb salvage angioplasty 10/11 but stabilizing.Epo ordered with HD  Heparin was started at 800 units/hr 10/12 0351 HL 0.47:  10/12 1307 HL 0.46 10/13 0324 HL 0.30: rate increased to 850 units/hr 10/13 1200 HL 0.52 10/13 1957 HL 0.60  Goal of Therapy:  Heparin level 0.3-0.7 units/ml Monitor platelets by anticoagulation protocol: Yes  Plan:  10/14 @ 0500 HL 0.59 therapeutic. Will continue current rate and will recheck next HL w/ am labs. Hgb has gone down one unit 10.3 >> 9.0 will continue to monitor.  Tobie Lords, Pharm.D. Clinical Pharmacist 02/02/2018,6:11 AM

## 2018-02-02 NOTE — Progress Notes (Signed)
PT Cancellation Note  Patient Details Name: Stephanie Ellis MRN: [id number] DOB: 12-Jul-1950   Cancelled Treatment:    Reason Eval/Treat Not Completed: Patient at procedure or test/unavailable. In hemodialysis. Re attempt at a later time/date as the schedule and pt availability allows.    Larae Grooms, PTA 02/02/2018, 11:31 AM

## 2018-02-02 NOTE — Consult Note (Signed)
Consultation Note Date: 02/02/2018   Patient Name: Stephanie Ellis  DOB: 1950-12-29  MRN: [id number]  Age / Sex: 67 y.o., female  PCP: System, Pcp Not In Referring Physician: Epifanio Lesches, MD  Reason for Consultation: Establishing goals of care   HPI/Patient Profile: 67 y.o. female  with past medical history of ESRD on peritoneal dialysis at home, type 2 DM, HTN, CHF, CAD s/p CABG  admitted on 01/29/2018 with bilateral leg pain. Workup revealed sepsis from osteomyelitis. During admission she was temporarily unresponsive and expressed desire for DNR status. Palliative medicine consulted for [place].   Clinical Assessment and Goals of Care:  I have reviewed medical records including EPIC notes, labs and imaging, assessed the patient and then met at the bedside along with patient and two daughters (she has three)  to discuss diagnosis prognosis, GOC, EOL wishes, disposition and options.  I introduced Palliative Medicine as specialized medical care for people living with serious illness. It focuses on providing relief from the symptoms and stress of a serious illness. The goal is to improve quality of life for both the patient and the family.  We discussed a brief life review of the patient. She comes from home. She has been ill for quite some time.    As far as functional and nutritional status-there has been significant decline in the last year. She requires assistance getting around her home. She has had significant decrease in appetite and there is noted weight loss.    We discussed their current illness and what it means in the larger context of their on-going co-morbidities.  Natural disease trajectory and expectations at EOL were discussed.  I attempted to elicit values and goals of care important to the patient.  She is not ready to completely stop dialysis but she does want to ensure she continues to  have good quality and function. She worries about how an amputation and surgery would affect her quality of life. We discussed alternative options to surgery including palliative management, antibiotics- we discussed that these options may not prolong patient's life, but could manage quality of life if she felt that surgical option would not meet her GOC.   Patient worries that she would die during surgery due to her mother died during surgery. However, patient's daughter also notes that patient has stated if she died during surgery that she would not want attempts made to be brought back.  The difference between aggressive medical intervention and comfort care was considered in light of the patient's goals of care.   Advanced directives, concepts specific to code status, artifical feeding and hydration, and rehospitalization were considered and discussed. Patient tells me for now she wants to be full code. Outside of patient's room- her daughter tells me that patient has been experiencing much external pressure for full aggressive measures and full code status. She states that patient had made decision for DNR status and was at peace with her decision and  Understood fully what it meant. However, she feels that family members pressured her  to change her mind.   Patient's daughters also expressed concerns about disposition. They request social work consult and are interested in placing patient in rehab at discharge.  Hospice and Palliative Care services outpatient were explained and offered.  Questions and concerns were addressed.  The family was encouraged to call with questions or concerns.   Primary Decision Maker PATIENT    SUMMARY OF RECOMMENDATIONS  -Full code for now- will f/u tomorrow for continued discussion as patient is considering her status -Symptom management-   -Tylenol 616m q8hrs  -oxycodone [id number] prn po moderate pain  -hydromorphone .51m101mV prn severe pain -Avoid  morphine due to ESRD   -PMT will f/u tomorrow as well for continued GOCWellsvilletpatient palliative for symptom management and GOCWagon Moundode Status/Advance Care Planning:  Full code  Palliative Prophylaxis:   Frequent Pain Assessment  Additional Recommendations (Limitations, Scope, Preferences):  Full Scope Treatment  Prognosis:    Unable to determine  Discharge Planning: [facility] with Palliative care service follow-up  Primary Diagnoses: Present on Admission: . [facility]is ([facility] have reviewed the medical record, interviewed the patient and family, and examined the patient. The following aspects are pertinent.  Past Medical History:  Diagnosis Date  . Anemia   . Anorexia   . Bacteremia due to Pseudomonas   . CHF (congestive heart failure) (HCC)    has not happened recently  . Complication of anesthesia 01/2016   WITH LAST STENT 10/17. PASsED OUT AND HAD TO BE AWAKENED  . Coronary artery disease involving left main coronary artery 01/2015   [facility]: 70% LM, p-mLAD 50-60% (Resting FFR 0.75), mRCA 80-90%, ~40 Ost OM & D1  . Dysrhythmia   . [facility]  [facility] secondary to acute kidney failure s/p CABG  DIALYSIS M/W/F  . Essential hypertension   . GERD (gastroesophageal reflux disease)   . Heart murmur    no treatment  . Hypercholesterolemia   . [place] ([place]  . 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 BenMarland Kitchenithcock: CABG x 3, LIMA to LAD, SVG to RCA, SVG to OM3, EVH  . Sinusitis 2019  . Type II diabetes mellitus with complication (HCC)    CAD   Social History   Socioeconomic History  . Marital status: Legally Separated    Spouse name: Not on file  . Number of children: Not on file  . Years of education: Not on file  . Highest education level: Not on file  Occupational History  . Not on file  Social Needs  . Financial resource strain: Not on file  . Food insecurity:    Worry:  Not on file    Inability: Not on file  . Transportation needs:    Medical: Not on file    Non-medical: Not on file  Tobacco Use  . Smoking status: Never Smoker  . Smokeless tobacco: Never Used  Substance and Sexual Activity  . Alcohol use: No  . Drug use: No  . Sexual activity: Never  Lifestyle  . Physical activity:    Days per week: Not on file    Minutes per session: Not on file  . Stress: Not on file  Relationships  . Social connections:    Talks on phone: Not on file    Gets together: Not on file    Attends religious service: Not on file    Active member  of club or organization: Not on file    Attends meetings of clubs or organizations: Not on file    Relationship status: Not on file  Other Topics Concern  . Not on file  Social History Narrative  . Not on file   Family History  Problem Relation Age of Onset  . Diabetes Mellitus II Mother   . Pancreatic cancer Father    Scheduled Meds: . aspirin EC  81 mg Oral Daily  . atorvastatin  80 mg Oral QHS  . Atropine Sulfate-NaCl  1 drop Ophthalmic BID  . carvedilol  3.125 mg Oral BID WC  . Chlorhexidine Gluconate Cloth  6 each Topical Q0600  . cholecalciferol  400 Units Oral Once per day on Mon Wed Fri  . docusate sodium  100 mg Oral BID  . epoetin (EPOGEN/PROCRIT) injection  4,000 Units Intravenous Q M,W,F-HD  . famotidine  20 mg Oral Daily  . feeding supplement (NEPRO CARB STEADY)  237 mL Oral BID BM  . insulin aspart  0-5 Units Subcutaneous QHS  . insulin aspart  0-9 Units Subcutaneous TID WC  . irbesartan  75 mg Oral Daily  . isosorbide mononitrate  30 mg Oral Daily  . multivitamin  1 tablet Oral QHS  . multivitamin with minerals  1 tablet Oral Daily  . multivitamin-lutein  1 capsule Oral Daily   Continuous Infusions: . sodium chloride 10 mL/hr at 01/30/18 1532  .  ceFAZolin (ANCEF) IV Stopped (02/01/18 2309)  . heparin 850 Units/hr (02/02/18 0838)   PRN Meds:.acetaminophen **OR** acetaminophen, bisacodyl,  metoCLOPramide (REGLAN) injection, morphine injection, nitroGLYCERIN, ondansetron **OR** ondansetron (ZOFRAN) IV, oxyCODONE-acetaminophen, traZODone Medications Prior to Admission:  Prior to Admission medications   Medication Sig Start Date End Date Taking? Authorizing Provider  aspirin EC 81 MG tablet Take 1 tablet (81 mg total) by mouth daily. 10/19/16  Yes Dustin Flock, MD  atorvastatin (LIPITOR) 80 MG tablet Take 80 mg by mouth at bedtime.  05/05/17  Yes [provider]  calcitRIOL (ROCALTROL) 0.5 MCG capsule Take 0.5 mcg by mouth daily.   Yes [provider]  carvedilol (COREG) 3.125 MG tablet Take 3.125 mg by mouth 2 (two) times daily with a meal.  03/06/17 03/01/18 Yes [provider]  Cholecalciferol 1000 units tablet Take 1 tablet by mouth daily. Monday, Wednesday and Friday at dialysis   Yes [provider]  insulin glargine (LANTUS) 100 unit/mL SOPN Inject 14 Units into the skin at bedtime.    Yes [provider]  isosorbide mononitrate (IMDUR) 30 MG 24 hr tablet Take 30 mg by mouth daily.    Yes [provider]  mirtazapine (REMERON) 7.5 MG tablet Take 7.5 mg by mouth at bedtime.   Yes [provider]  Multiple Vitamins-Minerals (PRORENAL + D) TABS Take 1 tablet by mouth daily.   Yes [provider]  nitroGLYCERIN (NITROSTAT) 0.4 MG SL tablet Place 1 tablet (0.4 mg total) under the tongue every 5 (five) minutes as needed for chest pain. 09/05/16  Yes Wieting, Richard, MD  Omega-3 300 MG CAPS Take 2 capsules by mouth daily.   Yes [provider]  valsartan (DIOVAN) 80 MG tablet Take 80 mg by mouth 2 (two) times daily.  07/16/17  Yes [provider]  ATROPINE SULFATE-NACL OP Apply 1 drop to eye 2 (two) times daily. Left eye only    [provider]  gentamicin cream (GARAMYCIN) 0.1 % APPLY TO EXIT SITE DAILY 11/10/17   [provider]  HYDROcodone-acetaminophen (NORCO) 5-325 MG tablet  Take 1 tablet by mouth every 6 (six) hours as needed for moderate pain. Patient not taking: Reported on 01/29/2018 10/30/17   Algernon Huxley, MD  lisinopril (PRINIVIL,ZESTRIL) 10 MG tablet Take 10 mg by mouth daily. 11/04/16 11/04/17  [provider]  metoCLOPramide (REGLAN) 10 MG tablet Take 1 tablet (10 mg total) by mouth every 6 (six) hours as needed for nausea, vomiting or refractory nausea / vomiting. Patient not taking: Reported on 01/29/2018 10/25/16   Dustin Flock, MD  metoprolol tartrate (LOPRESSOR) 25 MG tablet Take 25 mg by mouth 2 (two) times daily.     [provider]  Multiple Vitamin (MULTIVITAMIN WITH MINERALS) TABS tablet Take 2 tablets by mouth daily. Gummy vitamins    [provider]  ondansetron (ZOFRAN) 4 MG tablet Take 4 mg by mouth every 8 (eight) hours as needed for nausea or vomiting.    [provider]  polyvinyl alcohol (LIQUIFILM TEARS) 1.4 % ophthalmic solution Place 1 drop into both eyes 2 (two) times daily.     [provider]  predniSONE (DELTASONE) 10 MG tablet Take 10 mg by mouth daily with breakfast. taper    [provider]  ranitidine (ZANTAC) 300 MG tablet Take 1 tablet (300 mg total) by mouth at bedtime. Patient not taking: Reported on 01/29/2018 09/27/17   Darel Hong, MD  sevelamer carbonate (RENVELA) 800 MG tablet Take 800 mg by mouth 3 (three) times daily with meals.    [provider]   Allergies  Allergen Reactions  . Chlorthalidone Anaphylaxis, Itching and Rash  . Fentanyl Rash  . Midazolam Rash  . Ace Inhibitors Other (See Comments)    Reaction:  Hyperkalemia, agitation   . Angiotensin Receptor Blockers Other (See Comments)    Reaction:  Hyperkalemia, agitation   . Norvasc [Amlodipine] Itching and Rash  . Phenergan [Promethazine Hcl] Anxiety    "antsy, can't sit still"   Review of Systems  Constitutional: Positive for activity change and appetite change.  Musculoskeletal: Positive  for joint swelling and myalgias.    Physical Exam  Constitutional:  frail  Cardiovascular: Normal rate.  Pulmonary/Chest: Effort normal.  Skin: Skin is warm.  LLE with darkened area that starts at great toe and moves up calf, large bullae present on calf  Nursing note and vitals reviewed.   Vital Signs: BP (!) 126/51 (BP Location: Left Arm)   Pulse 74   Temp 98.3 F (36.8 C) (Oral)   Resp 18   Ht 5' 4"  (1.626 m)   Wt 58 kg   SpO2 96%   BMI 21.95 kg/m  Pain Scale: 0-10 POSS *See Group Information*: S-Acceptable,Sleep, easy to arouse Pain Score: 0-No pain   SpO2: SpO2: 96 % O2 Device:SpO2: 96 % O2 Flow Rate: .O2 Flow Rate (L/min): 2 L/min  IO: Intake/output summary:   Intake/Output Summary (Last 24 hours) at 02/02/2018 1510 Last data filed at 02/02/2018 1332 Gross per 24 hour  Intake 395.2 ml  Output 1507 ml  Net -1111.8 ml    LBM: Last BM Date: 01/27/18 Baseline Weight: Weight: 59 kg Most recent weight: Weight: 58 kg     Palliative Assessment/Data: PPS: 20%     Thank you for this consult. Palliative medicine will continue to follow and assist as needed.   Time In: 1400 Time Out: 1530 Time Total: 90 minutes Prolonged services: Yes Greater than 50%  of this time was spent counseling and coordinating care related to the  above assessment and plan.  Signed by: Mariana Kaufman, AGNP-C Palliative Medicine    Please contact Palliative Medicine Team phone at [phone] for questions and concerns.  For individual provider: See Shea Evans

## 2018-02-02 NOTE — Progress Notes (Signed)
HD tx end    02/02/18 1325  Vital Signs  Pulse Rate 74  Pulse Rate Source Monitor  Resp 13  BP (!) 136/55  BP Location Right Arm  BP Method Automatic  Patient Position (if appropriate) Lying  Oxygen Therapy  SpO2 100 %  O2 Device Nasal Cannula  O2 Flow Rate (L/min) 4 L/min  During Hemodialysis Assessment  Dialysis Fluid Bolus Normal Saline  Bolus Amount (mL) 250 mL  Intra-Hemodialysis Comments Tx completed

## 2018-02-02 NOTE — Progress Notes (Signed)
[facility]  Daily Progress Note   Subjective  - 3 Days Post-Op  Left forefoot and toe hurt.  Otherwise feels well Swelling fairly mild  Objective Vitals:   02/02/18 1315 02/02/18 1325 02/02/18 1332 02/02/18 1354  BP: (!) 105/45 (!) 136/55 99/87 (!) 126/51  Pulse: 76 74 76 74  Resp: 10 13 11 18   Temp:   97.8 F (36.6 C) 98.3 F (36.8 C)  TempSrc:   Oral Oral  SpO2: 92% 100% 100% 96%  Weight:   58 kg   Height:        Intake/Output Summary (Last 24 hours) at 02/02/2018 1529 Last data filed at 02/02/2018 1332 Gross per 24 hour  Intake 395.2 ml  Output 1507 ml  Net -1111.8 ml    PULM  CTAB CV  RRR VASC  1+ left DP pulse.  1+ LLE swelling.  Right foot without pedal pulses palpable, but doppler signals present Left great toe and forefoot demarcating somewhat as are areas on the anterior ankle and lower leg.  Otherwise foot is warm  Laboratory CBC    Component Value Date/Time   WBC 19.9 (H) 02/02/2018 0410   HGB 9.0 (L) 02/02/2018 0410   HCT 29.3 (L) 02/02/2018 0410   PLT 170 02/02/2018 0410    BMET    Component Value Date/Time   NA 139 01/30/2018 1345   K 3.2 (L) 01/30/2018 1345   CL 98 01/30/2018 1345   CO2 27 01/30/2018 1345   GLUCOSE 122 (H) 01/30/2018 1345   BUN 35 (H) 01/30/2018 1345   CREATININE 6.72 (H) 01/30/2018 1345   CALCIUM 8.2 (L) 01/30/2018 1345   GFRNONAA 6 (L) 01/30/2018 1345   GFRAA 7 (L) 01/30/2018 1345    Assessment/Planning: POD #3 s/p extensive LLE revascularization for left popliteal embolus   Tissue demarcating and would agree with allowing this to demarcate before debridement/amputation  Podiatry following  Would use full anticoagulation and consider transitioning to Eliquis or Xarelto in addition to 81 mg ASA  Would hold on right leg revascularization at this point with minimal symptoms, but likely will need at some point.    Leotis Pain  02/02/2018, 3:29 PM

## 2018-02-02 NOTE — Progress Notes (Signed)
Pre HD assessment   02/02/18 0941  Vital Signs  Temp 98.4 F (36.9 C)  Temp Source Oral  Pulse Rate 66  Pulse Rate Source Monitor  Resp 15  BP (!) 116/41  BP Location Right Arm  BP Method Automatic  Patient Position (if appropriate) Lying  Oxygen Therapy  SpO2 100 %  O2 Device Nasal Cannula  O2 Flow Rate (L/min) 4 L/min  Pain Assessment  Pain Scale 0-10  Pain Score 0  Dialysis Weight  Weight 59.6 kg  Type of Weight Pre-Dialysis  Time-Out for Hemodialysis  What Procedure? HD  Pt Identifiers(min of two) First/Last Name;MRN/Account#  Correct Site? Yes  Correct Side? Yes  Correct Procedure? Yes  Consents Verified? Yes  Rad Studies Available? N/A  Safety Precautions Reviewed? Yes  Engineer, civil (consulting) Number  (4A)  Station Number 1  UF/Alarm Test Passed  Conductivity: Meter 14  Conductivity: Machine  14  pH 7.6  Reverse Osmosis main  Normal Saline Lot Number [id number]  Dialyzer Lot Number [id number]  Disposable Set Lot Number [id number]  Machine Temperature 98.6 F (37 C)  Musician and Audible Yes  Blood Lines Intact and Secured Yes  Pre Treatment Patient Checks  Vascular access used during treatment Catheter  Hepatitis B Surface Antigen Results Negative  Date Hepatitis B Surface Antigen Drawn 11/27/17  Hepatitis B Surface Antibody 147 (>10)  Date Hepatitis B Surface Antibody Drawn 11/26/17  Hemodialysis Consent Verified Yes  Hemodialysis Standing Orders Initiated Yes  ECG (Telemetry) Monitor On Yes  Prime Ordered Normal Saline  Length of  DialysisTreatment -hour(s) 3.5 Hour(s)  Dialyzer Elisio 17H NR  Dialysate 3K, 2.5 Ca  Dialysis Anticoagulant None  Dialysate Flow Ordered 800  Blood Flow Rate Ordered 400 mL/min  Ultrafiltration Goal 1.5 Liters  Pre Treatment Labs Renal panel (Heparin level)  Dialysis Blood Pressure Support Ordered Normal Saline  Education / Care Plan  Dialysis Education Provided Yes  Documented Education in Care Plan Yes   Hemodialysis Catheter Right Subclavian  No Placement Date or Time found.   Placed prior to admission: Yes  Orientation: Right  Access Location: Subclavian  Site Condition No complications  Blue Lumen Status Heparin locked  Red Lumen Status Heparin locked  Purple Lumen Status N/A  Dressing Type Biopatch  Dressing Status Clean;Dry;Intact  Drainage Description None

## 2018-02-02 NOTE — Progress Notes (Signed)
Pre HD assessment    02/02/18 0942  Neurological  Level of Consciousness Alert  Orientation Level Oriented X4  Respiratory  Respiratory Pattern Regular;Unlabored  Chest Assessment Chest expansion symmetrical  Cardiac  ECG Monitor Yes  Cardiac Rhythm NSR  Vascular  R Radial Pulse +2  L Radial Pulse +2  Edema Generalized  Integumentary  Integumentary (WDL) X  Skin Color Appropriate for ethnicity  Musculoskeletal  Musculoskeletal (WDL) X  Generalized Weakness Yes  Assistive Device None  GU Assessment  Genitourinary (WDL) X  Genitourinary Symptoms  (HD)  Psychosocial  Psychosocial (WDL) WDL

## 2018-02-02 NOTE — Progress Notes (Signed)
Post HD assessment    02/02/18 1331  Neurological  Level of Consciousness Alert  Orientation Level Oriented X4  Respiratory  Respiratory Pattern Regular;Unlabored  Chest Assessment Chest expansion symmetrical  Cardiac  ECG Monitor Yes  Cardiac Rhythm NSR  Vascular  R Radial Pulse +2  L Radial Pulse +2  Edema Generalized  Integumentary  Integumentary (WDL) X  Skin Color Appropriate for ethnicity  Musculoskeletal  Musculoskeletal (WDL) X  Generalized Weakness Yes  Assistive Device None  GU Assessment  Genitourinary (WDL) X  Genitourinary Symptoms  (HD)  Psychosocial  Psychosocial (WDL) WDL

## 2018-02-02 NOTE — Progress Notes (Signed)
[place] at [facility]                                                                                                                                                                                  Patient Demographics   Stephanie Ellis, is a 67 y.o. female, DOB - [birth year], CBU:[id number]  Admit date - 01/29/2018   Admitting Physician Amelia Jo, MD  Outpatient Primary MD for the patient is System, Pcp Not In   LOS - 4  Subjective: denies any further nausea, came back from dialysis.  Patient complains of minimal pain in the left foot.  Overall she says she feels well. Review of Systems:   CONSTITUTIONAL: No documented fever. No fatigue, weakness. No weight gain, no weight loss.  EYES: No blurry or double vision.  ENT: No tinnitus. No postnasal drip. No redness of the oropharynx.  RESPIRATORY: No cough, no wheeze, no hemoptysis. No dyspnea.  CARDIOVASCULAR: No chest pain. No orthopnea. No palpitations. No syncope.  GASTROINTESTINAL: No nausea or abdominal pain today.   GENITOURINARY: No dysuria or hematuria.  ENDOCRINE: No polyuria or nocturia. No heat or cold intolerance.  HEMATOLOGY: No anemia. No bruising. No bleeding.  INTEGUMENTARY: No rashes. No lesions.  MUSCULOSKELETAL: Left great toe is black.  Some bullae noted on the left dorsum of the left foot. NEUROLOGIC: No numbness, tingling, or ataxia. No seizure-type activity.  PSYCHIATRIC: No anxiety. No insomnia. No ADD.    Vitals:   Vitals:   02/02/18 1315 02/02/18 1325 02/02/18 1332 02/02/18 1354  BP: (!) 105/45 (!) 136/55 99/87 (!) 126/51  Pulse: 76 74 76 74  Resp: 10 13 11 18   Temp:   97.8 F (36.6 C) 98.3 F (36.8 C)  TempSrc:   Oral Oral  SpO2: 92% 100% 100% 96%  Weight:   58 kg   Height:        Wt Readings from Last 3 Encounters:  02/02/18 58 kg  10/15/17 58.1 kg  10/09/17 58.1 kg     Intake/Output Summary (Last 24 hours) at 02/02/2018 1420 Last data filed at 02/02/2018  1332 Gross per 24 hour  Intake 395.2 ml  Output 1507 ml  Net -1111.8 ml    Physical Exam:   GENERAL: Pleasant-appearing in no apparent distress.  HEAD, EYES, EARS, NOSE AND THROAT: Atraumatic, normocephalic. Extraocular muscles are intact. Pupils equal and reactive to light. Sclerae anicteric. No conjunctival injection. No oro-pharyngeal erythema.  NECK: Supple. There is no jugular venous distention. No bruits, no lymphadenopathy, no thyromegaly.  HEART: Regular rate and rhythm,. No murmurs, no rubs, no clicks.  LUNGS: Clear to auscultation bilaterally. No rales or rhonchi. No wheezes.  ABDOMEN: Soft, flat, nontender, nondistended. Has good bowel sounds. No hepatosplenomegaly appreciated.  EXTREMITIES: Left great toe with ulcer, still has cyanosis of the left great toe.  Bullae noted on the dorsum of the left foot.  Left foot warm to touch. NEUROLOGIC: The patient is alert, awake, and oriented x3 with no focal motor or sensory deficits appreciated bilaterally.  SKIN: Moist and warm with no rashes appreciated.  Psych: Not anxious, depressed LN: No inguinal LN enlargement    Antibiotics   Anti-infectives (From admission, onward)   Start     Dose/Rate Route Frequency Ordered Stop   01/31/18 2200  ceFAZolin (ANCEF) IVPB 1 g/50 mL premix     1 g 100 mL/hr over 30 Minutes Intravenous Every 24 hours 01/31/18 1409     01/30/18 1800  vancomycin (VANCOCIN) IVPB 1000 mg/200 mL premix  Status:  Discontinued     1,000 mg 200 mL/hr over 60 Minutes Intravenous Every M-W-F (Hemodialysis) 01/30/18 0435 01/30/18 0906   01/30/18 1000  piperacillin-tazobactam (ZOSYN) IVPB 3.375 g  Status:  Discontinued     3.375 g 12.5 mL/hr over 240 Minutes Intravenous Every 12 hours 01/30/18 0435 01/30/18 0906   01/30/18 1000  ceFAZolin (ANCEF) 500 mg in dextrose 5 % 100 mL IVPB  Status:  Discontinued     500 mg 210 mL/hr over 30 Minutes Intravenous Every 12 hours 01/30/18 0906 01/31/18 1409   01/29/18 2200   piperacillin-tazobactam (ZOSYN) IVPB 3.375 g     3.375 g 100 mL/hr over 30 Minutes Intravenous  Once 01/29/18 2146 01/29/18 2242   01/29/18 2200  vancomycin (VANCOCIN) IVPB 1000 mg/200 mL premix     1,000 mg 200 mL/hr over 60 Minutes Intravenous  Once 01/29/18 2146 01/29/18 2314      Medications   Scheduled Meds: . aspirin EC  81 mg Oral Daily  . atorvastatin  80 mg Oral QHS  . Atropine Sulfate-NaCl  1 drop Ophthalmic BID  . carvedilol  3.125 mg Oral BID WC  . Chlorhexidine Gluconate Cloth  6 each Topical Q0600  . cholecalciferol  400 Units Oral Once per day on Mon Wed Fri  . docusate sodium  100 mg Oral BID  . epoetin (EPOGEN/PROCRIT) injection  4,000 Units Intravenous Q M,W,F-HD  . famotidine  20 mg Oral Daily  . feeding supplement (NEPRO CARB STEADY)  237 mL Oral BID BM  . insulin aspart  0-5 Units Subcutaneous QHS  . insulin aspart  0-9 Units Subcutaneous TID WC  . irbesartan  75 mg Oral Daily  . isosorbide mononitrate  30 mg Oral Daily  . multivitamin  1 tablet Oral QHS  . multivitamin with minerals  1 tablet Oral Daily  . multivitamin-lutein  1 capsule Oral Daily   Continuous Infusions: . sodium chloride 10 mL/hr at 01/30/18 1532  .  ceFAZolin (ANCEF) IV Stopped (02/01/18 2309)  . heparin 850 Units/hr (02/02/18 0838)   PRN Meds:.acetaminophen **OR** acetaminophen, bisacodyl, metoCLOPramide (REGLAN) injection, morphine injection, nitroGLYCERIN, ondansetron **OR** ondansetron (ZOFRAN) IV, oxyCODONE-acetaminophen, traZODone   Data Review:   Micro Results Recent Results (from the past 240 hour(s))  Blood Culture (routine x 2)     Status: Abnormal   Collection Time: 01/29/18  9:55 PM  Result Value Ref Range Status   Specimen Description   Final    BLOOD RIGHT ANTECUBITAL Performed at [facility], [address], [place], [place] [address]    Special Requests   Final    BOTTLES DRAWN AEROBIC AND ANAEROBIC  Blood Culture adequate volume Performed at  [facility], [address] [place], [place] [address]    Culture  Setup Time   Final    GRAM POSITIVE COCCI IN CLUSTERS IN BOTH AEROBIC AND ANAEROBIC BOTTLES CRITICAL RESULT CALLED TO, READ BACK BY AND VERIFIED WITH: KAREN HAYES 01/30/18 0839 JGF Performed at [facility], [address] [place], [place] [address]    Culture (A)  Final    STAPHYLOCOCCUS AUREUS SUSCEPTIBILITIES PERFORMED ON PREVIOUS CULTURE WITHIN THE LAST 5 DAYS. Performed at [facility], [place] [address] [place], [place] [address]    Report Status 02/01/2018 FINAL  Final  Blood Culture (routine x 2)     Status: Abnormal   Collection Time: 01/29/18  9:55 PM  Result Value Ref Range Status   Specimen Description   Final    BLOOD RIGHT ANTECUBITAL Performed at [facility], [address], [place], [place] [address]    Special Requests   Final    BOTTLES DRAWN AEROBIC AND ANAEROBIC Blood Culture adequate volume Performed at [facility], [address] [place], [place] [address]    Culture  Setup Time   Final    GRAM POSITIVE COCCI IN CLUSTERS IN BOTH AEROBIC AND ANAEROBIC BOTTLES CRITICAL RESULT CALLED TO, READ BACK BY AND VERIFIED WITH: KAREN HAYES 01/30/18 0839 JGF Performed at [facility], [place] [address], [place], [place] [address]    Culture STAPHYLOCOCCUS AUREUS (A)  Final   Report Status 02/01/2018 FINAL  Final   Organism ID, Bacteria STAPHYLOCOCCUS AUREUS  Final      Susceptibility   Staphylococcus aureus - MIC*    CIPROFLOXACIN <=0.5 SENSITIVE Sensitive     ERYTHROMYCIN <=0.25 SENSITIVE Sensitive     GENTAMICIN <=0.5 SENSITIVE Sensitive     OXACILLIN 0.5 SENSITIVE Sensitive     TETRACYCLINE <=1 SENSITIVE Sensitive     VANCOMYCIN <=0.5 SENSITIVE Sensitive     TRIMETH/SULFA <=10 SENSITIVE Sensitive     CLINDAMYCIN <=0.25 SENSITIVE Sensitive     RIFAMPIN <=0.5 SENSITIVE Sensitive     Inducible Clindamycin NEGATIVE Sensitive     * STAPHYLOCOCCUS  AUREUS  Blood Culture ID Panel (Reflexed)     Status: Abnormal   Collection Time: 01/29/18  9:55 PM  Result Value Ref Range Status   Enterococcus species NOT DETECTED NOT DETECTED Final   Vancomycin resistance NOT DETECTED NOT DETECTED Final   Listeria monocytogenes NOT DETECTED NOT DETECTED Final   Staphylococcus species DETECTED (A) NOT DETECTED Final    Comment: CRITICAL RESULT CALLED TO, READ BACK BY AND VERIFIED WITH: KAREN HAYES 01/30/18 0839 JGF    Staphylococcus aureus (BCID) DETECTED (A) NOT DETECTED Final    Comment: CRITICAL RESULT CALLED TO, READ BACK BY AND VERIFIED WITH: KAREN HAYES 01/30/18 0839 JGF Methicillin (oxacillin) susceptible Staphylococcus aureus (MSSA). Preferred therapy is anti staphylococcal beta lactam antibiotic (Cefazolin or Nafcillin), unless clinically contraindicated.    Methicillin resistance NOT DETECTED NOT DETECTED Final   Streptococcus species NOT DETECTED NOT DETECTED Final   Streptococcus agalactiae NOT DETECTED NOT DETECTED Final   Streptococcus pneumoniae NOT DETECTED NOT DETECTED Final   Streptococcus pyogenes NOT DETECTED NOT DETECTED Final   Acinetobacter baumannii NOT DETECTED NOT DETECTED Final   Enterobacteriaceae species NOT DETECTED NOT DETECTED Final   Enterobacter cloacae complex NOT DETECTED NOT DETECTED Final   Escherichia coli NOT DETECTED NOT DETECTED Final   Klebsiella oxytoca NOT DETECTED NOT DETECTED Final   Klebsiella pneumoniae NOT  DETECTED NOT DETECTED Final   Proteus species NOT DETECTED NOT DETECTED Final   Serratia marcescens NOT DETECTED NOT DETECTED Final   Carbapenem resistance NOT DETECTED NOT DETECTED Final   Haemophilus influenzae NOT DETECTED NOT DETECTED Final   Neisseria meningitidis NOT DETECTED NOT DETECTED Final   Pseudomonas aeruginosa NOT DETECTED NOT DETECTED Final   Candida albicans NOT DETECTED NOT DETECTED Final   Candida glabrata NOT DETECTED NOT DETECTED Final   Candida krusei NOT DETECTED NOT  DETECTED Final   Candida parapsilosis NOT DETECTED NOT DETECTED Final   Candida tropicalis NOT DETECTED NOT DETECTED Final    Comment: Performed at [facility], [address] [place], [place] [address]  MRSA PCR Screening     Status: None   Collection Time: 01/29/18 10:55 PM  Result Value Ref Range Status   MRSA by PCR NEGATIVE NEGATIVE Final    Comment:        The GeneXpert MRSA Assay (FDA approved for NASAL specimens only), is one component of a comprehensive MRSA colonization surveillance program. It is not intended to diagnose MRSA infection nor to guide or monitor treatment for MRSA infections. Performed at [facility], [address] [place], [place] [address]   Culture, blood (Routine X 2) w Reflex to ID Panel     Status: None (Preliminary result)   Collection Time: 01/30/18  9:27 AM  Result Value Ref Range Status   Specimen Description BLOOD RIGHT ANTECUBITAL  Final   Special Requests   Final    BOTTLES DRAWN AEROBIC AND ANAEROBIC Blood Culture adequate volume   Culture   Final    NO GROWTH 3 DAYS Performed at [facility], [address] [place],  [address]    Report Status PENDING  Incomplete    Radiology Reports Dg Tibia/fibula Left  Result Date: 01/29/2018 CLINICAL DATA:  Presents with ED via ems from home Having pain to both lower legs But states left leg is worse Increased pain with palpation Decreased pulses to foot EXAM: LEFT TIBIA AND FIBULA - 2 VIEW COMPARISON:  None. FINDINGS: No fracture.  No bone lesion. Knee and ankle joints are normally aligned. There are arterial vascular calcifications. Soft tissues are otherwise unremarkable. IMPRESSION: Negative Electronically Signed   By: Lajean Manes M.D.   On: 01/29/2018 20:58   US Venous Img Lower Unilateral Left  Result Date: 01/29/2018 CLINICAL DATA:  Left leg pain EXAM: LEFT LOWER EXTREMITY VENOUS DOPPLER ULTRASOUND TECHNIQUE: Gray-scale sonography with graded  compression, as well as color Doppler and duplex ultrasound were performed to evaluate the lower extremity deep venous systems from the level of the common femoral vein and including the common femoral, femoral, profunda femoral, popliteal and calf veins including the posterior tibial, peroneal and gastrocnemius veins when visible. The superficial great saphenous vein was also interrogated. Spectral Doppler was utilized to evaluate flow at rest and with distal augmentation maneuvers in the common femoral, femoral and popliteal veins. COMPARISON:  None. FINDINGS: Contralateral Common Femoral Vein: Respiratory phasicity is normal and symmetric with the symptomatic side. No evidence of thrombus. Normal compressibility. Common Femoral Vein: No evidence of thrombus. Normal compressibility, respiratory phasicity and response to augmentation. Saphenofemoral Junction: No evidence of thrombus. Normal compressibility and flow on color Doppler imaging. Profunda Femoral Vein: No evidence of thrombus. Normal compressibility and flow on color Doppler imaging. Femoral Vein: No evidence of thrombus. Normal compressibility, respiratory phasicity and response to augmentation. Popliteal Vein: No evidence of thrombus. Normal compressibility, respiratory  phasicity and response to augmentation. Calf Veins: No evidence of thrombus. Normal compressibility and flow on color Doppler imaging. Superficial Great Saphenous Vein: No evidence of thrombus. Normal compressibility. Venous Reflux:  None. Other Findings:  None. IMPRESSION: No evidence of deep venous thrombosis. Electronically Signed   By: Rolm Baptise M.D.   On: 01/29/2018 18:36   Dg Toe Great Left  Result Date: 01/29/2018 CLINICAL DATA:  Open wound to the left great toe for several months. EXAM: LEFT GREAT TOE COMPARISON:  10/07/2017 FINDINGS: Degenerative changes in the first metatarsal-phalangeal joint. Soft tissue defect over the plantar aspect of the left first toe. There is  underlying erosion of the distal phalangeal tuft cortex suggesting focal osteomyelitis. No evidence of acute fracture or dislocation. Diffuse bone demineralization. Prominent vascular calcifications. IMPRESSION: Soft tissue defect over the plantar aspect of the left first toe with underlying erosion of the distal phalangeal tuft suggesting osteomyelitis. Electronically Signed   By: Lucienne Capers M.D.   On: 01/29/2018 21:30     CBC Recent Labs  Lab 01/29/18 1722 01/30/18 0627 01/31/18 0351 02/01/18 0324 02/02/18 0410  WBC 13.2* 16.7* 14.9* 18.5* 19.9*  HGB 13.2 11.9* 10.6* 10.3* 9.0*  HCT 41.8 39.0 35.1* 34.0* 29.3*  PLT 180 156 157 167 170  MCV 95.2 97.5 99.2 98.0 97.3  MCH 30.1 29.8 29.9 29.7 29.9  MCHC 31.6 30.5 30.2 30.3 30.7  RDW 18.7* 18.7* 18.9* 18.7* 19.3*    Chemistries  Recent Labs  Lab 01/29/18 1722 01/30/18 0627 01/30/18 1345  NA 135 135 139  K 4.9 6.1* 3.2*  CL 91* 93* 98  CO2 26 25 27   GLUCOSE 216* 195* 122*  BUN 93* 97* 35*  CREATININE 15.41* 16.71* 6.72*  CALCIUM 8.6* 8.2* 8.2*   ------------------------------------------------------------------------------------------------------------------ estimated creatinine clearance is 7.1 mL/min (A) (by C-G formula based on SCr of 6.72 mg/dL (H)). ------------------------------------------------------------------------------------------------------------------ No results for input(s): HGBA1C in the last 72 hours. ------------------------------------------------------------------------------------------------------------------ No results for input(s): CHOL, HDL, LDLCALC, TRIG, CHOLHDL, LDLDIRECT in the last 72 hours. ------------------------------------------------------------------------------------------------------------------ No results for input(s): TSH, T4TOTAL, T3FREE, THYROIDAB in the last 72 hours.  Invalid input(s):  FREET3 ------------------------------------------------------------------------------------------------------------------ No results for input(s): VITAMINB12, FOLATE, FERRITIN, TIBC, IRON, RETICCTPCT in the last 72 hours.  Coagulation profile Recent Labs  Lab 01/29/18 2136  INR 1.14    No results for input(s): DDIMER in the last 72 hours.  Cardiac Enzymes No results for input(s): CKMB, TROPONINI, MYOGLOBIN in the last 168 hours.  Invalid input(s): CK ------------------------------------------------------------------------------------------------------------------ Invalid input(s): [place]  Patient admitted with sepsis   1.  Sepsis,  with MSSA with left great toe osteomyelitis.    Continue broad-spectrum antibiotics apreciate podiatry input plan for surgery .  Positive MSSA in the blood Acute ischemia of the left foot status post angioplasty of left popliteal by vascular , continue heparin drip, continue antibiotics for left great toe osteomyelitis, patient still has gangrenous changes in the left great toe, significant pain, seen by podiatry,  follow-up with podiatry today and see if patient can have left great toe amputation either inpatient arousal as  anoutpatient Appreciate vascular consult as well to see if patient can be started on aspirin, Plavix and discontinue heparin. 2.    Acute encephalopathy due to #1 now improved, overnight events reviewed. .  3.  PAD.,  Left leg ischemia, status post hospitalization, continue aspirin, heparin drip, statins vascular consult requested again to see if patient can be off heparin drip. 4.  End-stage renal disease on peritoneal dialysis.    Nephrology has been notified, patient does not want peritoneal dialysis anymore, continue hemodialysis through right IJ permacath Monday, Wednesday, Friday.  5.  Diabetes type 2.  controlled, continue insulin with sliding scale coverage.  During the hospital stay.  Continue carb  controlled renal diet. 6. CHF, currently clinically compensated, continue maintenance therapy.  Continue Coreg, Imdur, midodrine. 7.  Peripheral neuropathy, continue pain medication. #8. nausea likely secondary to sepsis/narcotic induced: And possible diabetic gastroparesis: Nausea better, continue Reglan, PPI. #.9. hyperkalemia: Potassium decreased from 6.1-3.2.     Code Status Orders  (From admission, onward)         Start     Ordered   01/29/18 2250  Full code  Continuous     01/29/18 2250        Code Status History    Date Active Date Inactive Code Status Order ID Comments User Context   10/24/2016 1055 10/25/2016 2243 Full Code [id number]  Hillary Bow, MD ED   10/17/2016 2143 10/19/2016 1440 Full Code [id number]  Vaughan Basta, MD Inpatient   09/04/2016 1708 09/05/2016 1500 Full Code [id number]  Epifanio Lesches, MD ED   08/22/2016 0938 08/22/2016 1833 Full Code [id number]  Algernon Huxley, MD Inpatient   06/27/2016 1121 06/27/2016 1846 Full Code [id number]  Algernon Huxley, MD Inpatient   10/03/2015 2235 10/04/2015 1724 Full Code [id number]  Harrie Foreman, MD Inpatient           Consults  NEPHROLOGY AND VASCULAR SURGERY   HEPRIN  Lab Results  Component Value Date   PLT 170 02/02/2018    Time Spent in minutes 38MIN   Greater than 50% of time spent in care coordination and counseling patient regarding the condition and plan of care. disCussed with patient's daughters today.  Epifanio Lesches M.D on 02/02/2018 at 2:20 PM  Between 7am to 6pm - Pager - [phone]  After 6pm go to [email or web address] - Proofreader  [employer]   Office  [phone]

## 2018-02-03 DIAGNOSIS — Z951 Presence of aortocoronary bypass graft: Secondary | ICD-10-CM

## 2018-02-03 DIAGNOSIS — R109 Unspecified abdominal pain: Secondary | ICD-10-CM

## 2018-02-03 DIAGNOSIS — R011 Cardiac murmur, unspecified: Secondary | ICD-10-CM

## 2018-02-03 DIAGNOSIS — I96 Gangrene, not elsewhere classified: Secondary | ICD-10-CM

## 2018-02-03 DIAGNOSIS — R197 Diarrhea, unspecified: Secondary | ICD-10-CM

## 2018-02-03 DIAGNOSIS — L84 Corns and callosities: Secondary | ICD-10-CM

## 2018-02-03 DIAGNOSIS — I998 Other disorder of circulatory system: Secondary | ICD-10-CM

## 2018-02-03 DIAGNOSIS — Z888 Allergy status to other drugs, medicaments and biological substances status: Secondary | ICD-10-CM

## 2018-02-03 DIAGNOSIS — E1122 Type 2 diabetes mellitus with diabetic chronic kidney disease: Secondary | ICD-10-CM

## 2018-02-03 DIAGNOSIS — Z794 Long term (current) use of insulin: Secondary | ICD-10-CM

## 2018-02-03 DIAGNOSIS — Z885 Allergy status to narcotic agent status: Secondary | ICD-10-CM

## 2018-02-03 DIAGNOSIS — E11621 Type 2 diabetes mellitus with foot ulcer: Secondary | ICD-10-CM

## 2018-02-03 DIAGNOSIS — R319 Hematuria, unspecified: Secondary | ICD-10-CM

## 2018-02-03 DIAGNOSIS — E1152 Type 2 diabetes mellitus with diabetic peripheral angiopathy with gangrene: Secondary | ICD-10-CM

## 2018-02-03 DIAGNOSIS — E1142 Type 2 diabetes mellitus with diabetic polyneuropathy: Secondary | ICD-10-CM

## 2018-02-03 DIAGNOSIS — I2510 Atherosclerotic heart disease of native coronary artery without angina pectoris: Secondary | ICD-10-CM

## 2018-02-03 DIAGNOSIS — Z992 Dependence on renal dialysis: Secondary | ICD-10-CM

## 2018-02-03 DIAGNOSIS — B9561 Methicillin susceptible Staphylococcus aureus infection as the cause of diseases classified elsewhere: Secondary | ICD-10-CM

## 2018-02-03 LAB — CBC
HCT: 28.8 % — ABNORMAL LOW (ref 36.0–46.0)
Hemoglobin: 8.7 g/dL — ABNORMAL LOW (ref 12.0–15.0)
MCH: 29.9 pg (ref 26.0–34.0)
MCHC: 30.2 g/dL (ref 30.0–36.0)
MCV: 99 fL (ref 80.0–100.0)
Platelets: 159 10*3/uL (ref 150–400)
RBC: 2.91 MIL/uL — ABNORMAL LOW (ref 3.87–5.11)
RDW: 19.7 % — ABNORMAL HIGH (ref 11.5–15.5)
WBC: 16.5 10*3/uL — ABNORMAL HIGH (ref 4.0–10.5)
nRBC: 0 % (ref 0.0–0.2)

## 2018-02-03 LAB — GLUCOSE, CAPILLARY
Glucose-Capillary: 141 mg/dL — ABNORMAL HIGH (ref 70–99)
Glucose-Capillary: 191 mg/dL — ABNORMAL HIGH (ref 70–99)
Glucose-Capillary: 216 mg/dL — ABNORMAL HIGH (ref 70–99)
Glucose-Capillary: 152 mg/dL — ABNORMAL HIGH (ref 70–99)

## 2018-02-03 LAB — HEPARIN LEVEL (UNFRACTIONATED)
Heparin Unfractionated: 0.85 IU/mL — ABNORMAL HIGH (ref 0.30–0.70)
Heparin Unfractionated: 0.81 IU/mL — ABNORMAL HIGH (ref 0.30–0.70)

## 2018-02-03 NOTE — Care Management (Signed)
Stephanie Ellis dialysis liaison notified that per nephrology patient to transition to outpatient HD.  Patient to attempt to sit for HD on Wednesday.

## 2018-02-03 NOTE — Progress Notes (Signed)
[place] for heparin Indication: arterial embolism  Allergies  Allergen Reactions  . Chlorthalidone Anaphylaxis, Itching and Rash  . Fentanyl Rash  . Midazolam Rash  . Ace Inhibitors Other (See Comments)    Reaction:  Hyperkalemia, agitation   . Angiotensin Receptor Blockers Other (See Comments)    Reaction:  Hyperkalemia, agitation   . Norvasc [Amlodipine] Itching and Rash  . Phenergan [Promethazine Hcl] Anxiety    "antsy, can't sit still"    Patient Measurements: Height: 5' 4"  (162.6 cm) Weight: 125 lb (56.7 kg) IBW/kg (Calculated) : 54.7 Heparin Dosing Weight: 56.7 kg  Vital Signs: Temp: 97.6 F (36.4 C) (10/15 1313) Temp Source: Oral (10/15 1313) BP: 138/68 (10/15 1313) Pulse Rate: 81 (10/15 1313)  Labs: Recent Labs    02/01/18 0324  02/02/18 0410 02/03/18 0523 02/03/18 0524 02/03/18 1516  HGB 10.3*  --  9.0*  --  8.7*  --   HCT 34.0*  --  29.3*  --  28.8*  --   PLT 167  --  170  --  159  --   HEPARINUNFRC 0.30   < > 0.59 0.85*  --  0.81*   < > = values in this interval not displayed.    Estimated Creatinine Clearance: 7.1 mL/min (A) (by C-G formula based on SCr of 6.72 mg/dL (H)).   Assessment: 26 yof now s/p peripheral vascular catheterization with arterial embolism. Pharmacy consulted to dose heparin. No initial bolus due to receiving heparin 5000 units IV x 1 in angio suite per Dr. Delana Meyer. Hemoglobin is trending down following limb salvage angioplasty 10/11 but stabilizing.Epo ordered with HD  Heparin was started at 800 units/hr 10/12 0351 HL 0.47:  10/12 1307 HL 0.46 10/13 0324 HL 0.30: rate increased to 850 units/hr 10/13 1200 HL 0.52 10/13 1957 HL 0.60 10/14 0410 HL 0.59 10/15 AM    HL 0.85: decrease rate to 750 units/hr   Goal of Therapy:  Heparin level 0.3-0.7 units/ml Monitor platelets by anticoagulation protocol: Yes   Plan:  10/15 1516 heparin level 0.81. RN confirms heparin drip running at 7.5  ml/hr; no s/sx of bleeding noted.  Decrease to 650 units/hr and recheck in 8 hours. CBC in AM.   Pharmacy will continue to follow.   Rocky Morel, Pharm.D, BCPS Clinical Pharmacist 02/03/2018,4:07 PM

## 2018-02-03 NOTE — NC FL2 (Signed)
[place] LEVEL OF CARE SCREENING TOOL     IDENTIFICATION  Patient Name: Stephanie Ellis Birthdate: [birth year] Sex: female Admission Date (Current Location): 01/29/2018  [place] and [place] Number:  Engineering geologist and Address:  [facility], [address] [place], [place] [address]      Provider Number: [id number]  Attending Physician Name and Address:  Epifanio Lesches, MD  Relative Name and Phone Number:       Current Level of Care: Hospital Recommended Level of Care: [place] Prior Approval Number:    Date Approved/Denied:   PASRR Number:    Discharge Plan: SNF    Current Diagnoses: Patient Active Problem List   Diagnosis Date Noted  . Diabetic osteomyelitis ([place])   . Peripheral vascular disease ([place])   . Advanced care planning/counseling discussion   . Palliative care by specialist   . Goals of care, counseling/discussion   . Diabetes mellitus, type 2 ([place]) 01/30/2018  . Sepsis ([place]) 01/29/2018  . Peripheral neuropathy 06/17/2017  . Encephalopathy 02/06/2017  . Hypertensive emergency 02/06/2017  . Endophthalmitis, left eye 12/05/2016  . Vomiting 10/24/2016  . Hematuria 10/17/2016  . Acute lower UTI 10/17/2016  . Anesthesia complication 51/05/5850  . Chest pain with moderate risk for cardiac etiology 09/04/2016  . Steal syndrome dialysis vascular access, initial encounter ([place]) 06/18/2016  . 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 Coronary artery disease involving coronary bypass graft of native heart with angina pectoris ([place]) 02/10/2016  . Hypertension 02/02/2016  . Hyperlipidemia 02/02/2016  . Coronary artery disease involving left main coronary artery 10/04/2015  . Abdominal pain of unknown etiology 10/04/2015  . ESRD (end stage renal disease) on dialysis ([place])   . Type II diabetes mellitus with complication ([place])   . NSTEMI (non-ST elevated myocardial infarction)  ([place])   . Right sided abdominal pain   . Elevated troponin 10/03/2015  . 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 Chronic diastolic congestive heart failure ([place]) 04/19/2014  . Stage 5 chronic kidney disease on chronic dialysis ([place]) 04/19/2014  . Anemia 09/20/2013  . Routine health maintenance 01/29/2013    Orientation RESPIRATION BLADDER Height & Weight     Self, Time, Situation, Place  Normal, O2(1 liter) Incontinent Weight: 125 lb (56.7 kg) Height:  5' 4"  (162.6 cm)  BEHAVIORAL SYMPTOMS/MOOD NEUROLOGICAL BOWEL NUTRITION STATUS  (none) (none) Incontinent Diet(carb modified)  AMBULATORY STATUS COMMUNICATION OF NEEDS Skin   Extensive Assist Verbally Normal                       Personal Care Assistance Level of Assistance  Bathing, Feeding, Dressing Bathing Assistance: Limited assistance Feeding assistance: Limited assistance Dressing Assistance: Limited assistance     Functional Limitations Info  (none reported)          SPECIAL CARE FACTORS FREQUENCY  PT (By licensed PT)                    Contractures Contractures Info: Not present    Additional Factors Info  Code Status Code Status Info: full             Current Medications (02/03/2018):  This is the current hospital active medication list Current Facility-Administered Medications  Medication Dose Route Frequency Provider Last Rate Last Dose  . 0.9 %  sodium chloride infusion   Intravenous Continuous Schnier, Dolores Lory, MD   Stopped at 02/03/18 0001  . acetaminophen (TYLENOL) tablet 650 mg  650 mg Oral Q6H PRN Schnier, Dolores Lory, MD       Or  . acetaminophen (TYLENOL) suppository 650 mg  650 mg Rectal Q6H PRN Schnier, Dolores Lory, MD      . acetaminophen (TYLENOL) tablet 650 mg  650 mg Oral Q8H [place], Wayna Chalet, NP   650 mg at 02/03/18 1324  . aspirin EC tablet 81 mg  81 mg Oral Daily Schnier, Dolores Lory, MD   81 mg at 02/03/18 0929  . atorvastatin (LIPITOR) tablet 80 mg  80 mg Oral QHS Schnier, Dolores Lory, MD    80 mg at 02/02/18 2128  . Atropine Sulfate-NaCl 0.01-0.9 % SOLN 1 drop  1 drop Ophthalmic BID Schnier, Dolores Lory, MD   1 drop at 02/01/18 1300  . bisacodyl (DULCOLAX) EC tablet 5 mg  5 mg Oral Daily PRN Schnier, Dolores Lory, MD      . carvedilol (COREG) tablet 3.125 mg  3.125 mg Oral BID WC Epifanio Lesches, MD   3.125 mg at 02/03/18 0851  . ceFAZolin (ANCEF) IVPB 1 g/50 mL premix  1 g Intravenous Q24H Epifanio Lesches, MD   Stopped at 02/02/18 2156  . Chlorhexidine Gluconate Cloth 2 % PADS 6 each  6 each Topical Q0600 Schnier, Dolores Lory, MD   6 each at 02/02/18 2032  . cholecalciferol (VITAMIN D) tablet 400 Units  400 Units Oral Once per day on Mon Wed Fri Katha Cabal, MD   400 Units at 02/02/18 [phone]  . docusate sodium (COLACE) capsule 100 mg  100 mg Oral BID Delana Meyer Dolores Lory, MD   100 mg at 02/03/18 0929  . epoetin alfa (EPOGEN,PROCRIT) injection 4,000 Units  4,000 Units Intravenous Q M,W,F-HD Murlean Iba, MD   4,000 Units at 02/02/18 1118  . famotidine (PEPCID) tablet 20 mg  20 mg Oral Daily Schnier, Dolores Lory, MD   20 mg at 02/03/18 2229  . feeding supplement (NEPRO CARB STEADY) liquid 237 mL  237 mL Oral BID BM Schnier, Dolores Lory, MD   237 mL at 02/03/18 1324  . heparin ADULT infusion 100 units/mL (25000 units/21m sodium chloride 0.45%)  750 Units/hr Intravenous Continuous Schnier, GDolores Lory MD 7.5 mL/hr at 02/03/18 0640 750 Units/hr at 02/03/18 0640  . HYDROmorphone (DILAUDID) injection 0.5 mg  0.5 mg Intravenous Q2H PRN MEarlie Counts NP      . insulin aspart (novoLOG) injection 0-5 Units  0-5 Units Subcutaneous QHS Schnier, GDolores Lory MD   2 Units at 01/31/18 2239  . insulin aspart (novoLOG) injection 0-9 Units  0-9 Units Subcutaneous TID WC Schnier, GDolores Lory MD   2 Units at 02/03/18 1325  . irbesartan (AVAPRO) tablet 75 mg  75 mg Oral Daily Schnier, GDolores Lory MD   75 mg at 02/03/18 07989 . isosorbide mononitrate (IMDUR) 24 hr tablet 30 mg  30 mg Oral Daily KEpifanio Lesches MD   30 mg at 02/03/18 0928  . metoCLOPramide (REGLAN) injection 5 mg  5 mg Intravenous Q8H PRN KEpifanio Lesches MD   5 mg at 01/31/18 1605  . multivitamin (RENA-VIT) tablet 1 tablet  1 tablet Oral QHS Schnier, GDolores Lory MD   1 tablet at 02/02/18 2127  . multivitamin with minerals tablet 1 tablet  1 tablet Oral Daily Schnier, GDolores Lory MD   1 tablet at 02/03/18 [phone] . multivitamin-lutein (OCUVITE-LUTEIN) capsule 1 capsule  1 capsule Oral Daily Schnier, GDolores Lory MD   1 capsule at 02/03/18 1326  . nitroGLYCERIN (  NITROSTAT) SL tablet 0.4 mg  0.4 mg Sublingual Q5 min PRN Schnier, Dolores Lory, MD      . ondansetron [facility]) tablet 4 mg  4 mg Oral Q6H PRN Schnier, Dolores Lory, MD       Or  . ondansetron [facility]) injection 4 mg  4 mg Intravenous Q6H PRN Schnier, Dolores Lory, MD   4 mg at 02/03/18 0926  . oxyCODONE (Oxy IR/ROXICODONE) immediate release tablet 5 mg  5 mg Oral Q3H PRN Earlie Counts, NP      . traZODone (DESYREL) tablet 25 mg  25 mg Oral QHS PRN Schnier, Dolores Lory, MD         Discharge Medications: Please see discharge summary for a list of discharge medications.  Relevant Imaging Results:  Relevant Lab Results:   Additional Information ss: [id number]  Shela Leff, LCSW

## 2018-02-03 NOTE — Progress Notes (Signed)
[facility]                                                                                                                                                                                  Patient Demographics   Stephanie Ellis, is a 67 y.o. female, DOB - 09-11-1950, KGM:[id number]  Admit date - 01/29/2018   Admitting Physician Amelia Jo, MD  Outpatient Primary MD for the patient is System, Pcp Not In   LOS - 5  Subjective:  patient sitting in the chair, denies any complaints like nausea, vomiting, leg pain.  Has a blister on the dorsum of the left foot that is more than yesterday.  Still continues to have black discoloration of the left great toe with some gangrenous changes. Review of Systems:   CONSTITUTIONAL: No documented fever. No fatigue, weakness. No weight gain, no weight loss.  EYES: No blurry or double vision.  ENT: No tinnitus. No postnasal drip. No redness of the oropharynx.  RESPIRATORY: No cough, no wheeze, no hemoptysis. No dyspnea.  CARDIOVASCULAR: No chest pain. No orthopnea. No palpitations. No syncope.  GASTROINTESTINAL: No nausea or abdominal pain today.   GENITOURINARY: No dysuria or hematuria.  ENDOCRINE: No polyuria or nocturia. No heat or cold intolerance.  HEMATOLOGY: No anemia. No bruising. No bleeding.  INTEGUMENTARY: No rashes. No lesions.  MUSCULOSKELETAL: Left great toe is black.  Some bullae noted on the left dorsum of the left foot. NEUROLOGIC: No numbness, tingling, or ataxia. No seizure-type activity.  PSYCHIATRIC: No anxiety. No insomnia. No ADD.    Vitals:   Vitals:   02/03/18 0450 02/03/18 0500 02/03/18 0844 02/03/18 1054  BP: (!) 151/52  (!) 149/56 (!) 120/46  Pulse: 73  76 74  Resp: 20  16   Temp: 98.3 F (36.8 C)  98.6 F (37 C)   TempSrc: Oral  Oral   SpO2: 100%  100%   Weight: 56.7 kg 56.7 kg    Height:        Wt Readings from Last 3 Encounters:  02/03/18 56.7 kg  10/15/17 58.1 kg   10/09/17 58.1 kg     Intake/Output Summary (Last 24 hours) at 02/03/2018 1233 Last data filed at 02/03/2018 0640 Gross per 24 hour  Intake 949.31 ml  Output 1507 ml  Net -557.69 ml    Physical Exam:   GENERAL: Pleasant-appearing in no apparent distress.  HEAD, EYES, EARS, NOSE AND THROAT: Atraumatic, normocephalic. Extraocular muscles are intact. Pupils equal and reactive to light. Sclerae anicteric. No conjunctival injection. No oro-pharyngeal erythema.  NECK: Supple. There is no jugular venous distention. No bruits, no lymphadenopathy, no thyromegaly.  HEART:  Regular rate and rhythm,. No murmurs, no rubs, no clicks.  LUNGS: Clear to auscultation bilaterally. No rales or rhonchi. No wheezes.  ABDOMEN: Soft, flat, nontender, nondistended. Has good bowel sounds. No hepatosplenomegaly appreciated.  EXTREMITIES: Left great toe with ulcer, still has cyanosis of the left great toe.  Bullae noted on the dorsum of the left foot.  Left foot warm to touch.  Patient has 1+ palpable dorsalis pedis pulse on the left foot. NEUROLOGIC: The patient is alert, awake, and oriented x3 with no focal motor or sensory deficits appreciated bilaterally.  SKIN: Moist and warm with no rashes appreciated.  Psych: Not anxious, depressed LN: No inguinal LN enlargement    Antibiotics   Anti-infectives (From admission, onward)   Start     Dose/Rate Route Frequency Ordered Stop   01/31/18 2200  ceFAZolin (ANCEF) IVPB 1 g/50 mL premix     1 g 100 mL/hr over 30 Minutes Intravenous Every 24 hours 01/31/18 1409     01/30/18 1800  vancomycin (VANCOCIN) IVPB 1000 mg/200 mL premix  Status:  Discontinued     1,000 mg 200 mL/hr over 60 Minutes Intravenous Every M-W-F (Hemodialysis) 01/30/18 0435 01/30/18 0906   01/30/18 1000  piperacillin-tazobactam (ZOSYN) IVPB 3.375 g  Status:  Discontinued     3.375 g 12.5 mL/hr over 240 Minutes Intravenous Every 12 hours 01/30/18 0435 01/30/18 0906   01/30/18 1000  ceFAZolin  (ANCEF) 500 mg in dextrose 5 % 100 mL IVPB  Status:  Discontinued     500 mg 210 mL/hr over 30 Minutes Intravenous Every 12 hours 01/30/18 0906 01/31/18 1409   01/29/18 2200  piperacillin-tazobactam (ZOSYN) IVPB 3.375 g     3.375 g 100 mL/hr over 30 Minutes Intravenous  Once 01/29/18 2146 01/29/18 2242   01/29/18 2200  vancomycin (VANCOCIN) IVPB 1000 mg/200 mL premix     1,000 mg 200 mL/hr over 60 Minutes Intravenous  Once 01/29/18 2146 01/29/18 2314      Medications   Scheduled Meds: . acetaminophen  650 mg Oral Q8H  . aspirin EC  81 mg Oral Daily  . atorvastatin  80 mg Oral QHS  . Atropine Sulfate-NaCl  1 drop Ophthalmic BID  . carvedilol  3.125 mg Oral BID WC  . Chlorhexidine Gluconate Cloth  6 each Topical Q0600  . cholecalciferol  400 Units Oral Once per day on Mon Wed Fri  . docusate sodium  100 mg Oral BID  . epoetin (EPOGEN/PROCRIT) injection  4,000 Units Intravenous Q M,W,F-HD  . famotidine  20 mg Oral Daily  . feeding supplement (NEPRO CARB STEADY)  237 mL Oral BID BM  . insulin aspart  0-5 Units Subcutaneous QHS  . insulin aspart  0-9 Units Subcutaneous TID WC  . irbesartan  75 mg Oral Daily  . isosorbide mononitrate  30 mg Oral Daily  . multivitamin  1 tablet Oral QHS  . multivitamin with minerals  1 tablet Oral Daily  . multivitamin-lutein  1 capsule Oral Daily   Continuous Infusions: . sodium chloride Stopped (02/03/18 0001)  .  ceFAZolin (ANCEF) IV Stopped (02/02/18 2156)  . heparin 750 Units/hr (02/03/18 0640)   PRN Meds:.acetaminophen **OR** acetaminophen, bisacodyl, HYDROmorphone (DILAUDID) injection, metoCLOPramide (REGLAN) injection, nitroGLYCERIN, ondansetron **OR** ondansetron (ZOFRAN) IV, oxyCODONE, traZODone   Data Review:   Micro Results Recent Results (from the past 240 hour(s))  Blood Culture (routine x 2)     Status: Abnormal   Collection Time: 01/29/18  9:55 PM  Result Value Ref Range  Status   Specimen Description   Final    BLOOD RIGHT  ANTECUBITAL Performed at [facility], [address] [place], [place] [address]    Special Requests   Final    BOTTLES DRAWN AEROBIC AND ANAEROBIC Blood Culture adequate volume Performed at [facility], [address] [place], [place] [address]    Culture  Setup Time   Final    GRAM POSITIVE COCCI IN CLUSTERS IN BOTH AEROBIC AND ANAEROBIC BOTTLES CRITICAL RESULT CALLED TO, READ BACK BY AND VERIFIED WITH: KAREN HAYES 01/30/18 0839 JGF Performed at [facility], [address] [place], [place] [address]    Culture (A)  Final    STAPHYLOCOCCUS AUREUS SUSCEPTIBILITIES PERFORMED ON PREVIOUS CULTURE WITHIN THE LAST 5 DAYS. Performed at [facility], [place] [address], [place], [place] [address]    Report Status 02/01/2018 FINAL  Final  Blood Culture (routine x 2)     Status: Abnormal   Collection Time: 01/29/18  9:55 PM  Result Value Ref Range Status   Specimen Description   Final    BLOOD RIGHT ANTECUBITAL Performed at [facility], [address], [place], [place] [address]    Special Requests   Final    BOTTLES DRAWN AEROBIC AND ANAEROBIC Blood Culture adequate volume Performed at [facility], [address] [place], [place] [address]    Culture  Setup Time   Final    GRAM POSITIVE COCCI IN CLUSTERS IN BOTH AEROBIC AND ANAEROBIC BOTTLES CRITICAL RESULT CALLED TO, READ BACK BY AND VERIFIED WITH: KAREN HAYES 01/30/18 0839 JGF Performed at [facility], [place] [address] [place], [place] [address]    Culture STAPHYLOCOCCUS AUREUS (A)  Final   Report Status 02/01/2018 FINAL  Final   Organism ID, Bacteria STAPHYLOCOCCUS AUREUS  Final      Susceptibility   Staphylococcus aureus - MIC*    CIPROFLOXACIN <=0.5 SENSITIVE Sensitive     ERYTHROMYCIN <=0.25 SENSITIVE Sensitive     GENTAMICIN <=0.5 SENSITIVE Sensitive     OXACILLIN 0.5 SENSITIVE Sensitive     TETRACYCLINE <=1 SENSITIVE Sensitive     VANCOMYCIN <=0.5  SENSITIVE Sensitive     TRIMETH/SULFA <=10 SENSITIVE Sensitive     CLINDAMYCIN <=0.25 SENSITIVE Sensitive     RIFAMPIN <=0.5 SENSITIVE Sensitive     Inducible Clindamycin NEGATIVE Sensitive     * STAPHYLOCOCCUS AUREUS  Blood Culture ID Panel (Reflexed)     Status: Abnormal   Collection Time: 01/29/18  9:55 PM  Result Value Ref Range Status   Enterococcus species NOT DETECTED NOT DETECTED Final   Vancomycin resistance NOT DETECTED NOT DETECTED Final   Listeria monocytogenes NOT DETECTED NOT DETECTED Final   Staphylococcus species DETECTED (A) NOT DETECTED Final    Comment: CRITICAL RESULT CALLED TO, READ BACK BY AND VERIFIED WITH: KAREN HAYES 01/30/18 0839 JGF    Staphylococcus aureus (BCID) DETECTED (A) NOT DETECTED Final    Comment: CRITICAL RESULT CALLED TO, READ BACK BY AND VERIFIED WITH: KAREN HAYES 01/30/18 0839 JGF Methicillin (oxacillin) susceptible Staphylococcus aureus (MSSA). Preferred therapy is anti staphylococcal beta lactam antibiotic (Cefazolin or Nafcillin), unless clinically contraindicated.    Methicillin resistance NOT DETECTED NOT DETECTED Final   Streptococcus species NOT DETECTED NOT DETECTED Final   Streptococcus agalactiae NOT DETECTED NOT DETECTED Final   Streptococcus pneumoniae NOT DETECTED NOT DETECTED Final   Streptococcus pyogenes NOT DETECTED NOT DETECTED Final   Acinetobacter baumannii NOT DETECTED NOT DETECTED Final  Enterobacteriaceae species NOT DETECTED NOT DETECTED Final   Enterobacter cloacae complex NOT DETECTED NOT DETECTED Final   Escherichia coli NOT DETECTED NOT DETECTED Final   Klebsiella oxytoca NOT DETECTED NOT DETECTED Final   Klebsiella pneumoniae NOT DETECTED NOT DETECTED Final   Proteus species NOT DETECTED NOT DETECTED Final   Serratia marcescens NOT DETECTED NOT DETECTED Final   Carbapenem resistance NOT DETECTED NOT DETECTED Final   Haemophilus influenzae NOT DETECTED NOT DETECTED Final   Neisseria meningitidis NOT DETECTED NOT  DETECTED Final   Pseudomonas aeruginosa NOT DETECTED NOT DETECTED Final   Candida albicans NOT DETECTED NOT DETECTED Final   Candida glabrata NOT DETECTED NOT DETECTED Final   Candida krusei NOT DETECTED NOT DETECTED Final   Candida parapsilosis NOT DETECTED NOT DETECTED Final   Candida tropicalis NOT DETECTED NOT DETECTED Final    Comment: Performed at [facility], [address] [place], [place] [address]  MRSA PCR Screening     Status: None   Collection Time: 01/29/18 10:55 PM  Result Value Ref Range Status   MRSA by PCR NEGATIVE NEGATIVE Final    Comment:        The GeneXpert MRSA Assay (FDA approved for NASAL specimens only), is one component of a comprehensive MRSA colonization surveillance program. It is not intended to diagnose MRSA infection nor to guide or monitor treatment for MRSA infections. Performed at [facility], [address] [place], [place] [address]   Culture, blood (Routine X 2) w Reflex to ID Panel     Status: None (Preliminary result)   Collection Time: 01/30/18  9:27 AM  Result Value Ref Range Status   Specimen Description BLOOD RIGHT ANTECUBITAL  Final   Special Requests   Final    BOTTLES DRAWN AEROBIC AND ANAEROBIC Blood Culture adequate volume   Culture   Final    NO GROWTH 4 DAYS Performed at [facility], [address] [place], [place] [address]    Report Status PENDING  Incomplete    Radiology Reports Dg Tibia/fibula Left  Result Date: 01/29/2018 CLINICAL DATA:  Presents with ED via ems from home Having pain to both lower legs But states left leg is worse Increased pain with palpation Decreased pulses to foot EXAM: LEFT TIBIA AND FIBULA - 2 VIEW COMPARISON:  None. FINDINGS: No fracture.  No bone lesion. Knee and ankle joints are normally aligned. There are arterial vascular calcifications. Soft tissues are otherwise unremarkable. IMPRESSION: Negative Electronically Signed   By: Lajean Manes M.D.   On:  01/29/2018 20:58   US Venous Img Lower Unilateral Left  Result Date: 01/29/2018 CLINICAL DATA:  Left leg pain EXAM: LEFT LOWER EXTREMITY VENOUS DOPPLER ULTRASOUND TECHNIQUE: Gray-scale sonography with graded compression, as well as color Doppler and duplex ultrasound were performed to evaluate the lower extremity deep venous systems from the level of the common femoral vein and including the common femoral, femoral, profunda femoral, popliteal and calf veins including the posterior tibial, peroneal and gastrocnemius veins when visible. The superficial great saphenous vein was also interrogated. Spectral Doppler was utilized to evaluate flow at rest and with distal augmentation maneuvers in the common femoral, femoral and popliteal veins. COMPARISON:  None. FINDINGS: Contralateral Common Femoral Vein: Respiratory phasicity is normal and symmetric with the symptomatic side. No evidence of thrombus. Normal compressibility. Common Femoral Vein: No evidence of thrombus. Normal compressibility, respiratory phasicity and response to augmentation. Saphenofemoral Junction: No evidence of thrombus. Normal compressibility and flow on color  Doppler imaging. Profunda Femoral Vein: No evidence of thrombus. Normal compressibility and flow on color Doppler imaging. Femoral Vein: No evidence of thrombus. Normal compressibility, respiratory phasicity and response to augmentation. Popliteal Vein: No evidence of thrombus. Normal compressibility, respiratory phasicity and response to augmentation. Calf Veins: No evidence of thrombus. Normal compressibility and flow on color Doppler imaging. Superficial Great Saphenous Vein: No evidence of thrombus. Normal compressibility. Venous Reflux:  None. Other Findings:  None. IMPRESSION: No evidence of deep venous thrombosis. Electronically Signed   By: Rolm Baptise M.D.   On: 01/29/2018 18:36   Dg Toe Great Left  Result Date: 01/29/2018 CLINICAL DATA:  Open wound to the left great toe  for several months. EXAM: LEFT GREAT TOE COMPARISON:  10/07/2017 FINDINGS: Degenerative changes in the first metatarsal-phalangeal joint. Soft tissue defect over the plantar aspect of the left first toe. There is underlying erosion of the distal phalangeal tuft cortex suggesting focal osteomyelitis. No evidence of acute fracture or dislocation. Diffuse bone demineralization. Prominent vascular calcifications. IMPRESSION: Soft tissue defect over the plantar aspect of the left first toe with underlying erosion of the distal phalangeal tuft suggesting osteomyelitis. Electronically Signed   By: Lucienne Capers M.D.   On: 01/29/2018 21:30     CBC Recent Labs  Lab 01/30/18 0627 01/31/18 0351 02/01/18 0324 02/02/18 0410 02/03/18 0524  WBC 16.7* 14.9* 18.5* 19.9* 16.5*  HGB 11.9* 10.6* 10.3* 9.0* 8.7*  HCT 39.0 35.1* 34.0* 29.3* 28.8*  PLT 156 157 167 170 159  MCV 97.5 99.2 98.0 97.3 99.0  MCH 29.8 29.9 29.7 29.9 29.9  MCHC 30.5 30.2 30.3 30.7 30.2  RDW 18.7* 18.9* 18.7* 19.3* 19.7*    Chemistries  Recent Labs  Lab 01/29/18 1722 01/30/18 0627 01/30/18 1345  NA 135 135 139  K 4.9 6.1* 3.2*  CL 91* 93* 98  CO2 26 25 27   GLUCOSE 216* 195* 122*  BUN 93* 97* 35*  CREATININE 15.41* 16.71* 6.72*  CALCIUM 8.6* 8.2* 8.2*   ------------------------------------------------------------------------------------------------------------------ estimated creatinine clearance is 7.1 mL/min (A) (by C-G formula based on SCr of 6.72 mg/dL (H)). ------------------------------------------------------------------------------------------------------------------ No results for input(s): HGBA1C in the last 72 hours. ------------------------------------------------------------------------------------------------------------------ No results for input(s): CHOL, HDL, LDLCALC, TRIG, CHOLHDL, LDLDIRECT in the last 72  hours. ------------------------------------------------------------------------------------------------------------------ No results for input(s): TSH, T4TOTAL, T3FREE, THYROIDAB in the last 72 hours.  Invalid input(s): FREET3 ------------------------------------------------------------------------------------------------------------------ No results for input(s): VITAMINB12, FOLATE, FERRITIN, TIBC, IRON, RETICCTPCT in the last 72 hours.  Coagulation profile Recent Labs  Lab 01/29/18 2136  INR 1.14    No results for input(s): DDIMER in the last 72 hours.  Cardiac Enzymes No results for input(s): CKMB, TROPONINI, MYOGLOBIN in the last 168 hours.  Invalid input(s): CK ------------------------------------------------------------------------------------------------------------------ Invalid input(s): [place]  Patient admitted with sepsis   1.  Sepsis,  with MSSA with left great toe osteomyelitis.    Continue broad-spectrum antibiotics apreciate podiatry input plan for surgery WBC decreased from 19.9-16.5 today.Marland Kitchen  Positive MSSA in the blood from blood cultures from 10/10.  But repeat blood cultures from 10 / 11 showing no growth for 4 days. Acute ischemia of the left foot status post angioplasty of left popliteal by vascular , continue heparin drip, continue antibiotics for left great toe osteomyelitis, patient still has gangrenous changes in the left great toe, seen by podiatry, likely surgery towards the end of the week for left great toe.  With possible left great toe MTP joint surgery.  Continue  to allow demarcation.  Patient perfusion is much better, continue to monitor.  Does have some blister on the dorsum of the left foot looks like a blood blister.  2.    Acute encephalopathy due to #1 now improved, overnight events reviewed. .  3.  PAD.,  Left leg ischemia, status post hospitalization, continue aspirin, heparin drip, statins  vascular surgery recommends  Eliquis. 4.  End-stage renal disease on peritoneal dialysis.    Nephrology has been notified, patient does not want peritoneal dialysis anymore, continue hemodialysis through right IJ permacath Monday, Wednesday, Friday.  5.  Diabetes type 2.  controlled, continue insulin with sliding scale coverage.  During the hospital stay.  Continue carb controlled renal diet. 6. CHF, currently clinically compensated, continue maintenance therapy.  Continue Coreg, Imdur, midodrine. 7.  Peripheral neuropathy, continue pain medication. #8. nausea likely secondary to sepsis/narcotic induced: And possible diabetic gastroparesis: Nausea better, continue Reglan, PPI. #.9. hyperkalemia: Potassium decreased from 6.1-3.2.     Code Status Orders  (From admission, onward)         Start     Ordered   01/29/18 2250  Full code  Continuous     01/29/18 2250        Code Status History    Date Active Date Inactive Code Status Order ID Comments User Context   10/24/2016 1055 10/25/2016 2243 Full Code [id number]  Hillary Bow, MD ED   10/17/2016 2143 10/19/2016 1440 Full Code [id number]  Vaughan Basta, MD Inpatient   09/04/2016 1708 09/05/2016 1500 Full Code [id number]  Epifanio Lesches, MD ED   08/22/2016 0938 08/22/2016 1833 Full Code [id number]  Algernon Huxley, MD Inpatient   06/27/2016 1121 06/27/2016 1846 Full Code [id number]  Algernon Huxley, MD Inpatient   10/03/2015 2235 10/04/2015 1724 Full Code [id number]  Harrie Foreman, MD Inpatient           Consults  NEPHROLOGY AND VASCULAR SURGERY   HEPRIN  Lab Results  Component Value Date   PLT 159 02/03/2018    Time Spent in minutes 38MIN   Greater than 50% of time spent in care coordination and counseling patient regarding the condition and plan of care. disCussed with patient's daughters today.  Epifanio Lesches M.D on 02/03/2018 at 12:33 PM  Between 7am to 6pm - Pager - [phone]  After 6pm go to [email or web address] - Proofreader  [employer]   Office  [phone]

## 2018-02-03 NOTE — Consult Note (Signed)
NAME: Stephanie Ellis  DOB: 07-25-1950  MRN: [id number]  Date/Time: 02/03/2018 7:06 PM Subjective:  REASON FOR CONSULT: Staph aureus bacteremia ? Stephanie Ellis is a 67 y.o. female with a history of end-stage renal disease, diabetes mellitus coronary artery disease status post CABG, peripheral neuropathy was brought to the ED from home on 01/29/2018 with worsening pain left leg.  As per patient she had noted that there was discoloration of the left foot 2 to 3 days prior to the presentation.  She then felt severe pain and was unable to ambulate.  As per patient she  has had callus and ulcer on the left foot on and off for almost a year.  She  is being followed by [facility] podiatrist who saw her couple of months ago.  When she came to the ED her temperature was as high as 102 blood pressure was 153/68.  She was noted to have a gangrenous  left great toe.  Blood cultures were sent she was initially started on vancomycin and Zosyn. She  was seen by podiatrist and as she had an ischemic left leg she was seen by vascular surgeon and underwent percutaneous transluminal angioplasty of the left popliteal artery and mechanical aspiration of the left popliteal artery and left anterior tibial artery and left tibioperoneal trunk and peroneal artery. She was started on heparin.  The blood culture came back positive for staph aureus bacteremia and mandatory ID consult was obtained and the Vanco and Zosyn was changed to cefazolin. Patient states she has been on dialysis for the past 2 years.  Initially she had a left AV fistula which did not work.  Then she had a tunnel catheter placed on the right side of her chest.  She wanted freedom and hands for the past 2 months she was been getting peritoneal dialysis.  But as she has been very tired before her admission she missed 2 days of peritoneal dialysis.  She also had some cramping of the abdomen.  She has now decided that she will not get peritoneal dialysis anymore. Past  Medical History:  Diagnosis Date  . Anemia   . Anorexia   . Bacteremia due to Pseudomonas   . CHF (congestive heart failure) (HCC)    has not happened recently  . Complication of anesthesia 01/2016   WITH LAST STENT 10/17. PASsED OUT AND HAD TO BE AWAKENED  . Coronary artery disease involving left main coronary artery 01/2015   [facility]: 70% LM, p-mLAD 50-60% (Resting FFR 0.75), mRCA 80-90%, ~40 Ost OM & D1  . Dysrhythmia   . ESRD (end stage renal disease) on dialysis [facility])    ESRD secondary to acute kidney failure s/p CABG  DIALYSIS M/W/F  . Essential hypertension   . GERD (gastroesophageal reflux disease)   . Heart murmur    no treatment  . Hypercholesterolemia   . Myocardial infarction ([place]) 2016  . 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 Marland Kitchen Haithcock: CABG x 3, LIMA to LAD, SVG to RCA, SVG to OM3, EVH  . Sinusitis 2019  . Type II diabetes mellitus with complication (HCC)    CAD    Past Surgical History:  Procedure Laterality Date  . ABDOMINAL HYSTERECTOMY    . ARTERIOVENOUS GRAFT PLACEMENT  05/2016  . AV FISTULA PLACEMENT Left 05/30/2016   Procedure: ARTERIOVENOUS graft;  Surgeon: Algernon Huxley, MD;  Location: [facility] ORS;  Service: Vascular;  Laterality: Left;  . CAPD INSERTION N/A 10/30/2017   Procedure: LAPAROSCOPIC  INSERTION CONTINUOUS AMBULATORY PERITONEAL DIALYSIS  (CAPD) CATHETER;  Surgeon: Algernon Huxley, MD;  Location: [facility] ORS;  Service: Vascular;  Laterality: N/A;  . CARDIAC CATHETERIZATION  01/2015   [facility]: Ost LM 70%, p-m LAD 50-60% (Rest FFR + @ 0.75), mRCA 80-90%, ostD1 40%, pOM1 40%  . CATARACT EXTRACTION W/PHACO Left 01/18/2016   Procedure: CATARACT EXTRACTION PHACO AND INTRAOCULAR LENS PLACEMENT (IOC);  Surgeon: Eulogio Bear, MD;  Location: [facility] ORS;  Service: Ophthalmology;  Laterality: Left;  [place] 1.05 AP% 15.5 CDE 10.16 Fluid Pack Lot # [id number]  . CATARACT EXTRACTION W/PHACO Right 08/01/2016   Procedure: CATARACT EXTRACTION PHACO AND INTRAOCULAR LENS PLACEMENT  (IOC);  Surgeon: Eulogio Bear, MD;  Location: [facility] ORS;  Service: Ophthalmology;  Laterality: Right;  [place] 01:00.6 AP% 11.4 CDE 6.93  LOT # [id number]  . COLONOSCOPY    . CORONARY ANGIOPLASTY     SENTS 02/12/16  . CORONARY ARTERY BYPASS GRAFT  03/28/15    [facility]: Dr. Waldemar Dickens: LIMA to LAD, SVG to RCA, SVG to OM3, EVH  . ESOPHAGOGASTRODUODENOSCOPY (EGD) WITH PROPOFOL N/A 10/24/2016   Procedure: ESOPHAGOGASTRODUODENOSCOPY (EGD) WITH PROPOFOL;  Surgeon: [name], MD;  Location: [facility] ENDOSCOPY;  Service: Endoscopy;  Laterality: N/A;  . EYE SURGERY Bilateral    cataract surgery  . EYE SURGERY     drains for glaucoma  . INSERTION EXPRESS TUBE SHUNT Right 08/01/2016   Procedure: INSERTION EXPRESS TUBE SHUNT;  Surgeon: Eulogio Bear, MD;  Location: [facility] ORS;  Service: Ophthalmology;  Laterality: Right;  . INSERTION OF AHMED VALVE Left 08/15/2016   Procedure: INSERTION OF AHMED VALVE;  Surgeon: Eulogio Bear, MD;  Location: [facility] ORS;  Service: Ophthalmology;  Laterality: Left;  . INSERTION OF DIALYSIS CATHETER    . LOWER EXTREMITY ANGIOGRAPHY Left 01/30/2018   Procedure: Lower Extremity Angiography;  Surgeon: Katha Cabal, MD;  Location: [place] CV LAB;  Service: Cardiovascular;  Laterality: Left;  . TRANSTHORACIC ECHOCARDIOGRAM  January 2017    EF 60-65%. GR 2 DD. Mild degenerative mitral valve disease but no prolapse or regurgitation. Mild left atrial dilation. Mild to moderate LVH. Pericardial effusion gone  . UPPER EXTREMITY ANGIOGRAPHY Left 06/27/2016   Procedure: Upper Extremity Angiography;  Surgeon: Algernon Huxley, MD;  Location: [place] CV LAB;  Service: Cardiovascular;  Laterality: Left;  . UPPER EXTREMITY ANGIOGRAPHY Left 08/22/2016   Procedure: Upper Extremity Angiography;  Surgeon: Algernon Huxley, MD;  Location: [place] CV LAB;  Service: Cardiovascular;  Laterality: Left;    Usual history Lives with her daughter Non-smoker  Family History  Problem Relation  Age of Onset  . Diabetes Mellitus II Mother   . Pancreatic cancer Father    Allergies  Allergen Reactions  . Chlorthalidone Anaphylaxis, Itching and Rash  . Fentanyl Rash  . Midazolam Rash  . Ace Inhibitors Other (See Comments)    Reaction:  Hyperkalemia, agitation   . Angiotensin Receptor Blockers Other (See Comments)    Reaction:  Hyperkalemia, agitation   . Norvasc [Amlodipine] Itching and Rash  . Phenergan [Promethazine Hcl] Anxiety    "antsy, can't sit still"  ? Current Facility-Administered Medications  Medication Dose Route Frequency Provider Last Rate Last Dose  . acetaminophen (TYLENOL) tablet 650 mg  650 mg Oral Q6H PRN Schnier, Dolores Lory, MD       Or  . acetaminophen (TYLENOL) suppository 650 mg  650 mg Rectal Q6H PRN Schnier, Dolores Lory, MD      . acetaminophen (TYLENOL)  tablet 650 mg  650 mg Oral Q8H Mahan, Wayna Chalet, NP   650 mg at 02/03/18 1324  . aspirin EC tablet 81 mg  81 mg Oral Daily Schnier, Dolores Lory, MD   81 mg at 02/03/18 0929  . atorvastatin (LIPITOR) tablet 80 mg  80 mg Oral QHS Schnier, Dolores Lory, MD   80 mg at 02/02/18 2128  . Atropine Sulfate-NaCl 0.01-0.9 % SOLN 1 drop  1 drop Ophthalmic BID Schnier, Dolores Lory, MD   1 drop at 02/01/18 1300  . bisacodyl (DULCOLAX) EC tablet 5 mg  5 mg Oral Daily PRN Schnier, Dolores Lory, MD      . carvedilol (COREG) tablet 3.125 mg  3.125 mg Oral BID WC Epifanio Lesches, MD   3.125 mg at 02/03/18 1620  . ceFAZolin (ANCEF) IVPB 1 g/50 mL premix  1 g Intravenous Q24H Epifanio Lesches, MD   Stopped at 02/02/18 2156  . Chlorhexidine Gluconate Cloth 2 % PADS 6 each  6 each Topical Q0600 Schnier, Dolores Lory, MD   6 each at 02/02/18 2032  . cholecalciferol (VITAMIN D) tablet 400 Units  400 Units Oral Once per day on Mon Wed Fri Katha Cabal, MD   400 Units at 02/02/18 [phone]  . docusate sodium (COLACE) capsule 100 mg  100 mg Oral BID Delana Meyer Dolores Lory, MD   100 mg at 02/03/18 0929  . epoetin alfa (EPOGEN,PROCRIT) injection  4,000 Units  4,000 Units Intravenous Q M,W,F-HD Murlean Iba, MD   4,000 Units at 02/02/18 1118  . famotidine (PEPCID) tablet 20 mg  20 mg Oral Daily Schnier, Dolores Lory, MD   20 mg at 02/03/18 2725  . feeding supplement (NEPRO CARB STEADY) liquid 237 mL  237 mL Oral BID BM Schnier, Dolores Lory, MD   237 mL at 02/03/18 1324  . heparin ADULT infusion 100 units/mL (25000 units/233m sodium chloride 0.45%)  650 Units/hr Intravenous Continuous WRocky Morel RPH 6.5 mL/hr at 02/03/18 1900 650 Units/hr at 02/03/18 1900  . HYDROmorphone (DILAUDID) injection 0.5 mg  0.5 mg Intravenous Q2H PRN MEarlie Counts NP      . insulin aspart (novoLOG) injection 0-5 Units  0-5 Units Subcutaneous QHS Schnier, GDolores Lory MD   2 Units at 01/31/18 2239  . insulin aspart (novoLOG) injection 0-9 Units  0-9 Units Subcutaneous TID WC Schnier, GDolores Lory MD   3 Units at 02/03/18 1652  . irbesartan (AVAPRO) tablet 75 mg  75 mg Oral Daily Schnier, GDolores Lory MD   75 mg at 02/03/18 [id number] . isosorbide mononitrate (IMDUR) 24 hr tablet 30 mg  30 mg Oral Daily KEpifanio Lesches MD   30 mg at 02/03/18 0928  . metoCLOPramide (REGLAN) injection 5 mg  5 mg Intravenous Q8H PRN KEpifanio Lesches MD   5 mg at 01/31/18 1605  . multivitamin (RENA-VIT) tablet 1 tablet  1 tablet Oral QHS Schnier, GDolores Lory MD   1 tablet at 02/02/18 2127  . multivitamin with minerals tablet 1 tablet  1 tablet Oral Daily Schnier, GDolores Lory MD   1 tablet at 02/03/18 [phone] . multivitamin-lutein (OCUVITE-LUTEIN) capsule 1 capsule  1 capsule Oral Daily Schnier, GDolores Lory MD   1 capsule at 02/03/18 1326  . nitroGLYCERIN (NITROSTAT) SL tablet 0.4 mg  0.4 mg Sublingual Q5 min PRN Schnier, GDolores Lory MD      . ondansetron ([facility] tablet 4 mg  4 mg Oral Q6H PRN Schnier, GDolores Lory MD  Or  . ondansetron (ZOFRAN) injection 4 mg  4 mg Intravenous Q6H PRN Schnier, Dolores Lory, MD   4 mg at 02/03/18 0926  . oxyCODONE (Oxy IR/ROXICODONE) immediate release  tablet 5 mg  5 mg Oral Q3H PRN Earlie Counts, NP      . traZODone (DESYREL) tablet 25 mg  25 mg Oral QHS PRN Schnier, Dolores Lory, MD         Abtx:  Anti-infectives (From admission, onward)   Start     Dose/Rate Route Frequency Ordered Stop   01/31/18 2200  ceFAZolin (ANCEF) IVPB 1 g/50 mL premix     1 g 100 mL/hr over 30 Minutes Intravenous Every 24 hours 01/31/18 1409     01/30/18 1800  vancomycin (VANCOCIN) IVPB 1000 mg/200 mL premix  Status:  Discontinued     1,000 mg 200 mL/hr over 60 Minutes Intravenous Every M-W-F (Hemodialysis) 01/30/18 0435 01/30/18 0906   01/30/18 1000  piperacillin-tazobactam (ZOSYN) IVPB 3.375 g  Status:  Discontinued     3.375 g 12.5 mL/hr over 240 Minutes Intravenous Every 12 hours 01/30/18 0435 01/30/18 0906   01/30/18 1000  ceFAZolin (ANCEF) 500 mg in dextrose 5 % 100 mL IVPB  Status:  Discontinued     500 mg 210 mL/hr over 30 Minutes Intravenous Every 12 hours 01/30/18 0906 01/31/18 1409   01/29/18 2200  piperacillin-tazobactam (ZOSYN) IVPB 3.375 g     3.375 g 100 mL/hr over 30 Minutes Intravenous  Once 01/29/18 2146 01/29/18 2242   01/29/18 2200  vancomycin (VANCOCIN) IVPB 1000 mg/200 mL premix     1,000 mg 200 mL/hr over 60 Minutes Intravenous  Once 01/29/18 2146 01/29/18 2314      REVIEW OF SYSTEMS:  Const: negative fever, negative chills, negative weight loss Eyes: negative diplopia or visual changes, negative eye pain ENT: negative coryza, negative sore throat Resp: negative cough, hemoptysis, dyspnea Cards: negative for chest pain, palpitations, lower extremity edema GU: negative for frequency, dysuria and hematuria GI: Abdominal cramping, diarrhea, bleeding, constipation Skin: negative for rash and pruritus Heme: negative for easy bruising and gum/nose bleeding MS: Fatigue and muscle weakness Neurolo:negative for headaches, has dizziness, vertigo, memory problems  Psych: negative for feelings of anxiety, depression  Endocrine: No  polydipsia or polyphagia Allergy/Immunology-as few medication allergies including for ACE inhibitors ?  Objective:  VITALS:  BP 138/68   Pulse 81   Temp 97.6 F (36.4 C) (Oral)   Resp 15   Ht 5' 4"  (1.626 m)   Wt 56.7 kg   SpO2 100%   BMI 21.46 kg/m  PHYSICAL EXAM:  General: Alert, cooperative, no distress, appears stated age.  Pale and tired Head: Normocephalic, without obvious abnormality, atraumatic. Eyes: Conjunctivae clear, anicteric sclerae. Pupils are equal ENT Nares normal. No drainage or sinus tenderness. Lips, mucosa, and tongue normal. No Thrush Neck: Supple, symmetrical, no adenopathy, thyroid: non tender no carotid bruit and no JVD. Right side of the chest tunneled catheter present Back: No CVA tenderness. Lungs: Clear to auscultation bilaterally. No Wheezing or Rhonchi. No rales. Heart: V0-J5, 2 / 6 systolic murmur Sternal scar Abdomen: Soft, non-tender,not distended. Bowel sounds normal. No masses, PD catheter present Extremities: Left arm has a fistula.  Left foot has areas of purplish discoloration on the dorsum of the foot.  The  left great toe is gangrenous ,no foul-smelling discharge skin: No rashes or lesions. Or bruising Lymph: Cervical, supraclavicular normal. Neurologic: Grossly non-focal Pertinent Labs CBC Latest Ref Rng & Units 02/03/2018  02/02/2018 02/01/2018  WBC 4.0 - 10.5 K/uL 16.5(H) 19.9(H) 18.5(H)  Hemoglobin 12.0 - 15.0 g/dL 8.7(L) 9.0(L) 10.3(L)  Hematocrit 36.0 - 46.0 % 28.8(L) 29.3(L) 34.0(L)  Platelets 150 - 400 K/uL 159 170 167   CMP Latest Ref Rng & Units 01/30/2018 01/30/2018 01/29/2018  Glucose 70 - 99 mg/dL 122(H) 195(H) 216(H)  BUN 8 - 23 mg/dL 35(H) 97(H) 93(H)  Creatinine 0.44 - 1.00 mg/dL 6.72(H) 16.71(H) 15.41(H)  Sodium 135 - 145 mmol/L 139 135 135  Potassium 3.5 - 5.1 mmol/L 3.2(L) 6.1(H) 4.9  Chloride 98 - 111 mmol/L 98 93(L) 91(L)  CO2 22 - 32 mmol/L 27 25 26   Calcium 8.9 - 10.3 mg/dL 8.2(L) 8.2(L) 8.6(L)  Total  Protein 6.5 - 8.1 g/dL - - -  Total Bilirubin 0.3 - 1.2 mg/dL - - -  Alkaline Phos 38 - 126 U/L - - -  AST 15 - 41 U/L - - -  ALT 14 - 54 U/L - - -   BC 10/10 2/2 MSSA 10/11 NG  IMAGING RESULTS: X-ray of the left foot shows erosion of the distal phalanx of the great toe ? Impression/Recommendation 67 y.o. female with a history of end-stage renal disease, diabetes mellitus coronary artery disease status post CABG, peripheral neuropathy was brought to the ED from home on 01/29/2018 with worsening pain left leg.  As per patient she had noted that there was discoloration of the left foot 2 to 3 days prior to the presentation.  She then felt severe pain and was unable to ambulate.  As per patient she  has had callus and ulcer on the left foot on and off for almost a year.? ? ?Left great toe gangrene with the areas of ischemic islands on the foot.  This is due to acute ischemia.  Status post angioplasty and aspiration.  Awaiting demarcation of the foot so that she would go for amputation of the great toe.  Staph aureus bacteremia.  The source of this bacteria could be either the dialysis catheter which has been present for almost a year versus peritoneal dialysis catheter versus the left foot.   She will need a 2D echo and then a TEE to rule out endocarditis. The hemodialysis catheter will have to be removed.  will discuss with nephrologist regarding the peritoneal dialysis catheter and sending peritoneal fluid for culture.  Continue cefazolin.  She may need for at least 4 weeks.  But depending on the TEE may get longer duration  Coronary artery disease status post CABG  Diabetes mellitus on insulin  ___________________________________________________ This the management of the patient and her niece who is a Marine scientist at [facility].

## 2018-02-03 NOTE — Progress Notes (Signed)
Daily Progress Note   Patient Name: Stephanie Ellis       Date: 02/03/2018 DOB: [birth year]  Age: 67 y.o. MRN#: [id number] Attending Physician: Epifanio Lesches, MD Primary Care Physician: System, Pcp Not In Admit Date: 01/29/2018  Reason for Consultation/Follow-up: Establishing goals of care  Subjective: Patient sitting in chair. No complaints. Visiting with friends. She did not remember me at first, but then stated she did after I told her who I was and that I visited with her and her daughters yesterday (not certain if she actually remembered me, or if this was just agreement). Noted patient was lethargic and fell asleep during PT session. Continues to have poor appetite.    ROS  Length of Stay: 5  Current Medications: Scheduled Meds:  . acetaminophen  650 mg Oral Q8H  . aspirin EC  81 mg Oral Daily  . atorvastatin  80 mg Oral QHS  . Atropine Sulfate-NaCl  1 drop Ophthalmic BID  . carvedilol  3.125 mg Oral BID WC  . Chlorhexidine Gluconate Cloth  6 each Topical Q0600  . cholecalciferol  400 Units Oral Once per day on Mon Wed Fri  . docusate sodium  100 mg Oral BID  . epoetin (EPOGEN/PROCRIT) injection  4,000 Units Intravenous Q M,W,F-HD  . famotidine  20 mg Oral Daily  . feeding supplement (NEPRO CARB STEADY)  237 mL Oral BID BM  . insulin aspart  0-5 Units Subcutaneous QHS  . insulin aspart  0-9 Units Subcutaneous TID WC  . irbesartan  75 mg Oral Daily  . isosorbide mononitrate  30 mg Oral Daily  . multivitamin  1 tablet Oral QHS  . multivitamin with minerals  1 tablet Oral Daily  . multivitamin-lutein  1 capsule Oral Daily    Continuous Infusions: . sodium chloride Stopped (02/03/18 0001)  .  ceFAZolin (ANCEF) IV Stopped (02/02/18 2156)  . heparin 750 Units/hr  (02/03/18 0640)    PRN Meds: acetaminophen **OR** acetaminophen, bisacodyl, HYDROmorphone (DILAUDID) injection, metoCLOPramide (REGLAN) injection, nitroGLYCERIN, ondansetron **OR** ondansetron (ZOFRAN) IV, oxyCODONE, traZODone  Physical Exam          Vital Signs: BP 138/68   Pulse 81   Temp 97.6 F (36.4 C) (Oral)   Resp 15   Ht 5' 4"  (1.626 m)   Wt 56.7 kg   SpO2 100%  BMI 21.46 kg/m  SpO2: SpO2: 100 % O2 Device: O2 Device: Nasal Cannula O2 Flow Rate: O2 Flow Rate (L/min): 1 L/min  Intake/output summary:   Intake/Output Summary (Last 24 hours) at 02/03/2018 1346 Last data filed at 02/03/2018 0640 Gross per 24 hour  Intake 949.31 ml  Output -  Net 949.31 ml   LBM: Last BM Date: 02/02/18 Baseline Weight: Weight: 59 kg Most recent weight: Weight: 56.7 kg       Palliative Assessment/Data:      Patient Active Problem List   Diagnosis Date Noted  . Diabetic osteomyelitis ([place])   . Peripheral vascular disease ([place])   . Advanced care planning/counseling discussion   . Palliative care by specialist   . Goals of care, counseling/discussion   . Diabetes mellitus, type 2 ([place]) 01/30/2018  . Sepsis ([place]) 01/29/2018  . Peripheral neuropathy 06/17/2017  . Encephalopathy 02/06/2017  . Hypertensive emergency 02/06/2017  . Endophthalmitis, left eye 12/05/2016  . Vomiting 10/24/2016  . Hematuria 10/17/2016  . Acute lower UTI 10/17/2016  . Anesthesia complication 60/63/0160  . Chest pain with moderate risk for cardiac etiology 09/04/2016  . Steal syndrome dialysis vascular access, initial encounter ([place]) 06/18/2016  . 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 Coronary artery disease involving coronary bypass graft of native heart with angina pectoris ([place]) 02/10/2016  . Hypertension 02/02/2016  . Hyperlipidemia 02/02/2016  . Coronary artery disease involving left main coronary artery 10/04/2015  . Abdominal pain of unknown etiology 10/04/2015  .  ESRD (end stage renal disease) on dialysis ([place])   . Type II diabetes mellitus with complication ([place])   . NSTEMI (non-ST elevated myocardial infarction) ([place])   . Right sided abdominal pain   . Elevated troponin 10/03/2015  . 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 Chronic diastolic congestive heart failure ([place]) 04/19/2014  . Stage 5 chronic kidney disease on chronic dialysis ([place]) 04/19/2014  . Anemia 09/20/2013  . Routine health maintenance 01/29/2013    Palliative Care Assessment & Plan   Patient Profile: 67 y.o. female  with past medical history of ESRD on peritoneal dialysis at home, type 2 DM, HTN, CHF, CAD s/p CABG  admitted on 01/29/2018 with bilateral leg pain. Workup revealed sepsis from osteomyelitis. During admission she was temporarily unresponsive and expressed desire for DNR status. Palliative medicine consulted for [place].  Assessment/Recommendations/Plan   Cont current care  Further GOC discussion not held due to patient with visitors and daughters not present- will continue to follow and address   Goals of Care and Additional Recommendations:  Limitations on Scope of Treatment: Full Scope Treatment  Code Status:  Full code  Prognosis:   Unable to determine  Discharge Planning:  [place] for rehab with Palliative care service follow-up   Thank you for allowing the Palliative Medicine Team to assist in the care of this patient.   Time In: 1335 Time Out: 1355 Total Time 20 mins Prolonged Time Billed no      Greater than 50%  of this time was spent counseling and coordinating care related to the above assessment and plan.  Mariana Kaufman, AGNP-C Palliative Medicine   Please contact Palliative Medicine Team phone at [phone] for questions and concerns.

## 2018-02-03 NOTE — Progress Notes (Signed)
[facility]  ROUNDING NOTE   Subjective:   Ms. Tian Davison admitted to [facility] on 01/29/2018 for Peripheral vascular disease [facility]) [I73.9] Right leg pain [M79.604] Left leg pain [M79.605] Ulcer of great toe, left, with unspecified severity ([place]) [L97.529]   Patient is doing fair, sitting up in the chair Appetite appears to be improving a little.  States she ate the Magic cup ice cream yesterday  Objective:  Vital signs in last 24 hours:  Temp:  [97.8 F (36.6 C)-98.6 F (37 C)] 98.6 F (37 C) (10/15 0844) Pulse Rate:  [66-76] 74 (10/15 1054) Resp:  [8-20] 16 (10/15 0844) BP: (93-151)/(43-87) 120/46 (10/15 1054) SpO2:  [92 %-100 %] 100 % (10/15 0844) Weight:  [56.7 kg-58 kg] 56.7 kg (10/15 0500)  Weight change: 0.2 kg Filed Weights   02/02/18 1332 02/03/18 0450 02/03/18 0500  Weight: 58 kg 56.7 kg 56.7 kg    Intake/Output: I/O last 3 completed shifts: In: 1344.5 [P.O.:120; I.V.:1134.9; IV Piggyback:89.6] Out: 1507 [YIAXK:[id number]]   Intake/Output this shift:  No intake/output data recorded.  Physical Exam: General: NAD, laying in bed, chronically ill-appearing  Head: Normocephalic, atraumatic. Moist oral mucosal membranes  Eyes: Anicteric   Neck: Supple,    Lungs:  Clear to auscultation  Heart: Regular rate and rhythm  Abdomen:  Soft, nontender,   Extremities:  left first toe with tenderness and ischemic changes, discoloration    Neurologic: Alert, oreinted  Access: PD catheter, RIJ permcath    Basic Metabolic Panel: Recent Labs  Lab 01/29/18 1722 01/30/18 0627 01/30/18 1345 02/02/18 0410  NA 135 135 139  --   K 4.9 6.1* 3.2*  --   CL 91* 93* 98  --   CO2 26 25 27   --   GLUCOSE 216* 195* 122*  --   BUN 93* 97* 35*  --   CREATININE 15.41* 16.71* 6.72*  --   CALCIUM 8.6* 8.2* 8.2*  --   PHOS  --   --   --  9.1*    Liver Function Tests: No results for input(s): AST, ALT, ALKPHOS, BILITOT, PROT, ALBUMIN in the last 168 hours. No results for  input(s): LIPASE, AMYLASE in the last 168 hours. No results for input(s): AMMONIA in the last 168 hours.  CBC: Recent Labs  Lab 01/30/18 0627 01/31/18 0351 02/01/18 0324 02/02/18 0410 02/03/18 0524  WBC 16.7* 14.9* 18.5* 19.9* 16.5*  HGB 11.9* 10.6* 10.3* 9.0* 8.7*  HCT 39.0 35.1* 34.0* 29.3* 28.8*  MCV 97.5 99.2 98.0 97.3 99.0  PLT 156 157 167 170 159    Cardiac Enzymes: No results for input(s): CKTOTAL, CKMB, CKMBINDEX, TROPONINI in the last 168 hours.  BNP: Invalid input(s): POCBNP  CBG: Recent Labs  Lab 02/02/18 0737 02/02/18 1402 02/02/18 1623 02/02/18 2215 02/03/18 0747  GLUCAP 78 109* 203* 159* 141*    Microbiology: Results for orders placed or performed during the hospital encounter of 01/29/18  Blood Culture (routine x 2)     Status: Abnormal   Collection Time: 01/29/18  9:55 PM  Result Value Ref Range Status   Specimen Description   Final    BLOOD RIGHT ANTECUBITAL Performed at [facility], [address], [place], [place] [address]    Special Requests   Final    BOTTLES DRAWN AEROBIC AND ANAEROBIC Blood Culture adequate volume Performed at [facility], [address] [place], [place] [address]    Culture  Setup Time   Final    GRAM POSITIVE  COCCI IN CLUSTERS IN BOTH AEROBIC AND ANAEROBIC BOTTLES CRITICAL RESULT CALLED TO, READ BACK BY AND VERIFIED WITH: KAREN HAYES 01/30/18 0839 JGF Performed at [facility], [address] [place], [place] [address]    Culture (A)  Final    STAPHYLOCOCCUS AUREUS SUSCEPTIBILITIES PERFORMED ON PREVIOUS CULTURE WITHIN THE LAST 5 DAYS. Performed at [facility], [place] [address] [place], [place] [address]    Report Status 02/01/2018 FINAL  Final  Blood Culture (routine x 2)     Status: Abnormal   Collection Time: 01/29/18  9:55 PM  Result Value Ref Range Status   Specimen Description   Final    BLOOD RIGHT ANTECUBITAL Performed at [facility], [address], [place], [place] [address]    Special Requests   Final    BOTTLES DRAWN AEROBIC AND ANAEROBIC Blood Culture adequate volume Performed at [facility], [address] [place], [place] [address]    Culture  Setup Time   Final    GRAM POSITIVE COCCI IN CLUSTERS IN BOTH AEROBIC AND ANAEROBIC BOTTLES CRITICAL RESULT CALLED TO, READ BACK BY AND VERIFIED WITH: KAREN HAYES 01/30/18 0839 JGF Performed at [facility], [place] [address] [place], [place] [address]    Culture STAPHYLOCOCCUS AUREUS (A)  Final   Report Status 02/01/2018 FINAL  Final   Organism ID, Bacteria STAPHYLOCOCCUS AUREUS  Final      Susceptibility   Staphylococcus aureus - MIC*    CIPROFLOXACIN <=0.5 SENSITIVE Sensitive     ERYTHROMYCIN <=0.25 SENSITIVE Sensitive     GENTAMICIN <=0.5 SENSITIVE Sensitive     OXACILLIN 0.5 SENSITIVE Sensitive     TETRACYCLINE <=1 SENSITIVE Sensitive     VANCOMYCIN <=0.5 SENSITIVE Sensitive     TRIMETH/SULFA <=10 SENSITIVE Sensitive     CLINDAMYCIN <=0.25 SENSITIVE Sensitive     RIFAMPIN <=0.5 SENSITIVE Sensitive     Inducible Clindamycin NEGATIVE Sensitive     * STAPHYLOCOCCUS AUREUS  Blood Culture ID Panel (Reflexed)     Status: Abnormal   Collection Time: 01/29/18  9:55 PM  Result Value Ref Range Status   Enterococcus species NOT DETECTED NOT DETECTED Final   Vancomycin resistance NOT DETECTED NOT DETECTED Final   Listeria monocytogenes NOT DETECTED NOT DETECTED Final   Staphylococcus species DETECTED (A) NOT DETECTED Final    Comment: CRITICAL RESULT CALLED TO, READ BACK BY AND VERIFIED WITH: KAREN HAYES 01/30/18 0839 JGF    Staphylococcus aureus (BCID) DETECTED (A) NOT DETECTED Final    Comment: CRITICAL RESULT CALLED TO, READ BACK BY AND VERIFIED WITH: KAREN HAYES 01/30/18 0839 JGF Methicillin (oxacillin) susceptible Staphylococcus aureus (MSSA). Preferred therapy is anti staphylococcal beta lactam antibiotic (Cefazolin or Nafcillin), unless clinically  contraindicated.    Methicillin resistance NOT DETECTED NOT DETECTED Final   Streptococcus species NOT DETECTED NOT DETECTED Final   Streptococcus agalactiae NOT DETECTED NOT DETECTED Final   Streptococcus pneumoniae NOT DETECTED NOT DETECTED Final   Streptococcus pyogenes NOT DETECTED NOT DETECTED Final   Acinetobacter baumannii NOT DETECTED NOT DETECTED Final   Enterobacteriaceae species NOT DETECTED NOT DETECTED Final   Enterobacter cloacae complex NOT DETECTED NOT DETECTED Final   Escherichia coli NOT DETECTED NOT DETECTED Final   Klebsiella oxytoca NOT DETECTED NOT DETECTED Final   Klebsiella pneumoniae NOT DETECTED NOT DETECTED Final   Proteus species NOT DETECTED NOT DETECTED Final   Serratia marcescens NOT DETECTED NOT DETECTED Final   Carbapenem resistance NOT DETECTED NOT DETECTED Final  Haemophilus influenzae NOT DETECTED NOT DETECTED Final   Neisseria meningitidis NOT DETECTED NOT DETECTED Final   Pseudomonas aeruginosa NOT DETECTED NOT DETECTED Final   Candida albicans NOT DETECTED NOT DETECTED Final   Candida glabrata NOT DETECTED NOT DETECTED Final   Candida krusei NOT DETECTED NOT DETECTED Final   Candida parapsilosis NOT DETECTED NOT DETECTED Final   Candida tropicalis NOT DETECTED NOT DETECTED Final    Comment: Performed at [facility], [address] [place], [place] [address]  MRSA PCR Screening     Status: None   Collection Time: 01/29/18 10:55 PM  Result Value Ref Range Status   MRSA by PCR NEGATIVE NEGATIVE Final    Comment:        The GeneXpert MRSA Assay (FDA approved for NASAL specimens only), is one component of a comprehensive MRSA colonization surveillance program. It is not intended to diagnose MRSA infection nor to guide or monitor treatment for MRSA infections. Performed at [facility], [address] [place], [place] [address]   Culture, blood (Routine X 2) w Reflex to ID Panel     Status: None (Preliminary result)    Collection Time: 01/30/18  9:27 AM  Result Value Ref Range Status   Specimen Description BLOOD RIGHT ANTECUBITAL  Final   Special Requests   Final    BOTTLES DRAWN AEROBIC AND ANAEROBIC Blood Culture adequate volume   Culture   Final    NO GROWTH 4 DAYS Performed at [facility], [address] [place], [place] [address]    Report Status PENDING  Incomplete    Coagulation Studies: No results for input(s): LABPROT, INR in the last 72 hours.  Urinalysis: No results for input(s): COLORURINE, LABSPEC, PHURINE, GLUCOSEU, HGBUR, BILIRUBINUR, KETONESUR, PROTEINUR, UROBILINOGEN, NITRITE, LEUKOCYTESUR in the last 72 hours.  Invalid input(s): APPERANCEUR    Imaging: No results found.   Medications:   . sodium chloride Stopped (02/03/18 0001)  .  ceFAZolin (ANCEF) IV Stopped (02/02/18 2156)  . heparin 750 Units/hr (02/03/18 0640)   . acetaminophen  650 mg Oral Q8H  . aspirin EC  81 mg Oral Daily  . atorvastatin  80 mg Oral QHS  . Atropine Sulfate-NaCl  1 drop Ophthalmic BID  . carvedilol  3.125 mg Oral BID WC  . Chlorhexidine Gluconate Cloth  6 each Topical Q0600  . cholecalciferol  400 Units Oral Once per day on Mon Wed Fri  . docusate sodium  100 mg Oral BID  . epoetin (EPOGEN/PROCRIT) injection  4,000 Units Intravenous Q M,W,F-HD  . famotidine  20 mg Oral Daily  . feeding supplement (NEPRO CARB STEADY)  237 mL Oral BID BM  . insulin aspart  0-5 Units Subcutaneous QHS  . insulin aspart  0-9 Units Subcutaneous TID WC  . irbesartan  75 mg Oral Daily  . isosorbide mononitrate  30 mg Oral Daily  . multivitamin  1 tablet Oral QHS  . multivitamin with minerals  1 tablet Oral Daily  . multivitamin-lutein  1 capsule Oral Daily   acetaminophen **OR** acetaminophen, bisacodyl, HYDROmorphone (DILAUDID) injection, metoCLOPramide (REGLAN) injection, nitroGLYCERIN, ondansetron **OR** ondansetron (ZOFRAN) IV, oxyCODONE, traZODone  Assessment/ Plan:  Ms. Cherolyn Behrle is a 67  y.o. black female with end stage renal disease on peritoneal dialysis, hypertension, congestive heart failure, coronary artery disease status post CABG, diabetes mellitus type II insulin dependent, GERD, peripheral vascular disease admitted to [facility] on 01/29/2018 for Peripheral vascular disease ([place]) [I73.9] Right leg pain [M79.604] Left leg pain [  M79.605] Ulcer of great toe, left, with unspecified severity [facility]) [L97.529]   [facility] CCPD 10 hours 5 exchanges 2 liter fills   1. End Stage Renal Disease with hyperkalemia: missed several days of peritoneal dialysis.  Does not want to do PD anymore as outpatient - will continue Hemodialysis through [facility] M-W-F - will need to be scheduled for outpatient hemodialysis - Try dialysis in chair on wednseday  2. Peripheral vascular disease: left first toe with gangrene and pain.  - underwent limb salvage angioplasty on 01/31/11  3. Anemia of chronic kidney disease: hemoglobin 8.7 EPO with HD  4. Secondary Hyperparathyroidism:  - sevelamer with meals.  - Hold for now because appetite is poor  5. Diabetes mellitus type II with chronic kidney disease: insulin dependent -  Continue glucose control     LOS: 5 Karysa Heft 10/15/201911:24 AM

## 2018-02-03 NOTE — Progress Notes (Addendum)
Physical Therapy Treatment Patient Details Name: Stephanie Ellis MRN: [id number] DOB: 1950-08-23 Today's Date: 02/03/2018    History of Present Illness Patient is a 67 year old female admitted with sepsis and receiving a catheterization of L LE on 01/30/18.  PMH includes DMII, CABG x3, CAD, MI, ESRD, CAD, CHF, anorexia and anemia.    PT Comments    Pt in recliner, ready for session.  LE's lowered in chair and pt reported increased dizziness 7/10.  BP 136/51.  Seated AROM as below.  Pt did fall asleep during exercises and needed verbal cues to continue.  BP retaken after exercises 120/46 P 74.  She reported continued dizziness "I feel like I'm rocking on a balloon."  Mobility held at this time for pt and staff safety.  LE's elevated and pt remained in recliner with back rest reclined.  She did state she received medication prior to session.  Pt stated she has been doing her HEP and was encouraged to continue to do so.   Follow Up Recommendations  SNF     Equipment Recommendations  None recommended by PT    Recommendations for Other Services       Precautions / Restrictions Precautions Precautions: Fall Restrictions Weight Bearing Restrictions: No LLE Weight Bearing: Weight bearing as tolerated    Mobility  Bed Mobility               General bed mobility comments: in recliner with +2 from nursing   Transfers                    Ambulation/Gait                 Stairs             Wheelchair Mobility    Modified Rankin (Stroke Patients Only)       Balance                                            Cognition Arousal/Alertness: Awake/alert Behavior During Therapy: WFL for tasks assessed/performed Overall Cognitive Status: Within Functional Limits for tasks assessed                                        Exercises Other Exercises Other Exercises: Seated arom x 10 for ankle pumps, LAQ and marches     General Comments        Pertinent Vitals/Pain Pain Assessment: No/denies pain    Home Living                      Prior Function            PT Goals (current goals can now be found in the care plan section) Progress towards PT goals: Progressing toward goals    Frequency    Min 2X/week      PT Plan Current plan remains appropriate    Co-evaluation              AM-PAC PT "6 Clicks" Daily Activity  Outcome Measure  Difficulty turning over in bed (including adjusting bedclothes, sheets and blankets)?: A Lot Difficulty moving from lying on back to sitting on the side of the bed? : A Lot Difficulty sitting down on and standing up from  a chair with arms (e.g., wheelchair, bedside commode, etc,.)?: Unable Help needed moving to and from a bed to chair (including a wheelchair)?: A Lot Help needed walking in hospital room?: Total Help needed climbing 3-5 steps with a railing? : Total 6 Click Score: 9    End of Session Equipment Utilized During Treatment: Oxygen Activity Tolerance: Patient limited by fatigue;Other (comment) Patient left: in chair;with call bell/phone within reach;with family/visitor present;with chair alarm set         Time: 2353-6144 PT Time Calculation (min) (ACUTE ONLY): 14 min  Charges:  $Therapeutic Exercise: 8-22 mins                     [name], PTA 02/03/18, 10:59 AM

## 2018-02-03 NOTE — Clinical Social Work Note (Signed)
Clinical Social Work Assessment  Patient Details  Name: Stephanie Ellis MRN: [id number] Date of Birth: [birth year]  Date of referral:  02/03/18               Reason for consult:  Facility Placement                Permission sought to share information with:  Facility Sport and exercise psychologist, Family Supports Permission granted to share information::  Yes, Verbal Permission Granted  Name::        Agency::     Relationship::     Contact Information:     Housing/Transportation Living arrangements for the past 2 months:  Single Family Home Source of Information:  Patient, Adult Children Patient Interpreter Needed:  None Criminal Activity/Legal Involvement Pertinent to Current Situation/Hospitalization:  No - Comment as needed Significant Relationships:  Adult Children, Siblings Lives with:  Self Do you feel safe going back to the place where you live?  Yes Need for family participation in patient care:  Yes (Comment)  Care giving concerns:  Patient resides at home alone.   Social Worker assessment / plan:  CSW spoke with patient, her daughter: Stephanie Ellis: [phone], and her sister at bedside this afternoon. Patient was very happy this afternoon as she was feeling much better and not having any pain. CSW discussed purpose of visit and patient, daughter, and sister all agree with short term rehab. CSW explained the bed search process. CSW to begin bedsearch today.  Employment status:    Insurance information:    PT Recommendations:    Information / Referral to community resources:     Patient/Family's Response to care:  Patient and family expressed appreciation for CSW assistance.  Patient/Family's Understanding of and Emotional Response to Diagnosis, Current Treatment, and Prognosis:  Patient and family were very happy to consider the next part of her recovery journey.   Emotional Assessment Appearance:  Appears stated age Attitude/Demeanor/Rapport:  (pleasant and cooperative) Affect  (typically observed):  Accepting, Adaptable, Calm, Happy Orientation:  Oriented to Self, Oriented to Place, Oriented to  Time, Oriented to Situation Alcohol / Substance use:  Not Applicable Psych involvement (Current and /or in the community):  No (Comment)  Discharge Needs  Concerns to be addressed:  Care Coordination Readmission within the last 30 days:  No Current discharge risk:  None Barriers to Discharge:  No Barriers Identified   Shela Leff, LCSW 02/03/2018, 2:41 PM

## 2018-02-03 NOTE — Progress Notes (Signed)
Nutrition Follow Up Note   DOCUMENTATION CODES:   Not applicable  INTERVENTION:   Nepro Shake po BID, each supplement provides 425 kcal and 19 grams protein  MVI daily  Rena-vite daily   Ocuvite daily for wound healing (provides zinc, vitamin A, vitamin C, Vitamin E, copper, and selenium)  Liberalize diet and add phosphorus restriction   NUTRITION DIAGNOSIS:   Increased nutrient needs related to chronic illness, wound healing(ESRD on PD) as evidenced by increased estimated needs.  GOAL:   Patient will meet greater than or equal to 90% of their needs  -progressing   MONITOR:   PO intake, Supplement acceptance, Labs, Weight trends, I & O's, Skin  ASSESSMENT:   67 y.o. black female with end stage renal disease on peritoneal dialysis, hypertension, congestive heart failure, coronary artery disease status post CABG, diabetes mellitus type II insulin dependent, GERD, peripheral vascular disease admitted to [facility] on 01/29/2018 for toe osteomyelitis and sepsis    Pt s/p arterial embolization 10/11  Pt continues to have fair appetite; patient eating 75-100% of small meals and drinking some Nepro. RD will liberalize pt's diet as a renal diet is very restrictive and limits protein. RD will add phosphorus restriction through health touch and restrict salt packs from pt's trays. Per chart, pt remains weight stable since admit. Recommend continue supplements and vitamins. GOC being discussed; palliative following.   Medications reviewed and include: aspirin, vitamin D, colace, epogen, pepcid, insulin, heparin, MVI, rena-vite, ocuvite, cefazolin  Labs reviewed: P 9.1(H) Wbc- 16.7(H), Hgb 8.7(L), Hct 28.8(L) iPTH- 103(H)- 09/2016 cbgs- 78, 109, 203, 159, 141 x 24 hrs  Diet Order:   Diet Order            Diet renal/carb modified with fluid restriction Diet-HS Snack? Nothing; Fluid restriction: 1200 mL Fluid; Room service appropriate? Yes; Fluid consistency: Thin  Diet effective now             EDUCATION NEEDS:   Education needs have been addressed  Skin:  Skin Assessment: Reviewed RN Assessment(L toe ulcer )  Last BM:  10/14- type 7  Height:   Ht Readings from Last 1 Encounters:  01/30/18 5' 4"  (1.626 m)    Weight:   Wt Readings from Last 1 Encounters:  02/03/18 56.7 kg    Ideal Body Weight:  54.5 kg  BMI:  Body mass index is 21.46 kg/m.  Estimated Nutritional Needs:   Kcal:  1400-1600kcal/day   Protein:  71-83g/day   Fluid:  UOP + 1L  Koleen Distance MS, RD, LDN Pager #- [phone] Office#- [phone] After Hours Pager: [phone]

## 2018-02-03 NOTE — Progress Notes (Signed)
Daily Progress Note   Subjective  - 4 Days Post-Op  Follow-up left foot gangrene great toe.  Objective Vitals:   02/03/18 0450 02/03/18 0500 02/03/18 0844 02/03/18 1054  BP: (!) 151/52  (!) 149/56 (!) 120/46  Pulse: 73  76 74  Resp: 20  16   Temp: 98.3 F (36.8 C)  98.6 F (37 C)   TempSrc: Oral  Oral   SpO2: 100%  100%   Weight: 56.7 kg 56.7 kg    Height:        Physical Exam: She does have a +1 palpable DP pulse on the left foot.  There is noted gangrenous changes of the distal great toe.  There is an area of discoloration on the dorsal aspect of her foot and anterior leg.  This looks to be superficial possible blood blistering.  This does not look to be necrotic at this point.  Laboratory CBC    Component Value Date/Time   WBC 16.5 (H) 02/03/2018 0524   HGB 8.7 (L) 02/03/2018 0524   HCT 28.8 (L) 02/03/2018 0524   PLT 159 02/03/2018 0524    BMET    Component Value Date/Time   NA 139 01/30/2018 1345   K 3.2 (L) 01/30/2018 1345   CL 98 01/30/2018 1345   CO2 27 01/30/2018 1345   GLUCOSE 122 (H) 01/30/2018 1345   BUN 35 (H) 01/30/2018 1345   CREATININE 6.72 (H) 01/30/2018 1345   CALCIUM 8.2 (L) 01/30/2018 1345   GFRNONAA 6 (L) 01/30/2018 1345   GFRAA 7 (L) 01/30/2018 1345    Assessment/Planning: Gangrene left great toe   Agree that allowing demarcation would be her best option.  This can likely demarcate and we could consider surgery towards the end of the week.  It looks to be demarcating at the level of the metatarsophalangeal joint at this time.  The anterior foot and lower leg discoloration of the skin will have to continue to monitor but do not believe this is necrosis but more likely blistering fluid in the subcuticular region.  We will continue to monitor.  Will allow her to become is medically stable as possible prior to surgery.    Samara Deist A  02/03/2018, 12:21 PM

## 2018-02-03 NOTE — Progress Notes (Signed)
[place] for heparin Indication: arterial embolism  Allergies  Allergen Reactions  . Chlorthalidone Anaphylaxis, Itching and Rash  . Fentanyl Rash  . Midazolam Rash  . Ace Inhibitors Other (See Comments)    Reaction:  Hyperkalemia, agitation   . Angiotensin Receptor Blockers Other (See Comments)    Reaction:  Hyperkalemia, agitation   . Norvasc [Amlodipine] Itching and Rash  . Phenergan [Promethazine Hcl] Anxiety    "antsy, can't sit still"    Patient Measurements: Height: 5' 4"  (162.6 cm) Weight: 125 lb (56.7 kg) IBW/kg (Calculated) : 54.7 Heparin Dosing Weight: 55.4 kg  Vital Signs: Temp: 98.3 F (36.8 C) (10/15 0450) Temp Source: Oral (10/15 0450) BP: 151/52 (10/15 0450) Pulse Rate: 73 (10/15 0450)  Labs: Recent Labs    02/01/18 0324  02/01/18 1957 02/02/18 0410 02/03/18 0523  HGB 10.3*  --   --  9.0*  --   HCT 34.0*  --   --  29.3*  --   PLT 167  --   --  170  --   HEPARINUNFRC 0.30   < > 0.60 0.59 0.85*   < > = values in this interval not displayed.    Estimated Creatinine Clearance: 7.1 mL/min (A) (by C-G formula based on SCr of 6.72 mg/dL (H)).   Medical History: Past Medical History:  Diagnosis Date  . Anemia   . Anorexia   . Bacteremia due to Pseudomonas   . CHF (congestive heart failure) (HCC)    has not happened recently  . Complication of anesthesia 01/2016   WITH LAST STENT 10/17. PASsED OUT AND HAD TO BE AWAKENED  . Coronary artery disease involving left main coronary artery 01/2015   [facility]: 70% LM, p-mLAD 50-60% (Resting FFR 0.75), mRCA 80-90%, ~40 Ost OM & D1  . Dysrhythmia   . ESRD (end stage renal disease) on dialysis [facility])    ESRD secondary to acute kidney failure s/p CABG  DIALYSIS M/W/F  . Essential hypertension   . GERD (gastroesophageal reflux disease)   . Heart murmur    no treatment  . Hypercholesterolemia   . Myocardial infarction ([place]) 2016  . 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  Marland Kitchen Haithcock: CABG x 3, LIMA to LAD, SVG to RCA, SVG to OM3, EVH  . Sinusitis 2019  . Type II diabetes mellitus with complication (HCC)    CAD    Medications:  Infusions:  . sodium chloride Stopped (02/03/18 0001)  .  ceFAZolin (ANCEF) IV Stopped (02/02/18 2156)  . heparin 850 Units/hr (02/03/18 0600)    Assessment: 45 yof now s/p peripheral vascular catheterization with arterial embolism. Pharmacy consulted to dose heparin. No initial bolus due to receiving heparin 5000 units IV x 1 in angio suite per Dr. Delana Meyer. Hemoglobin is trending down following limb salvage angioplasty 10/11 but stabilizing.Epo ordered with HD  Heparin was started at 800 units/hr 10/12 0351 HL 0.47:  10/12 1307 HL 0.46 10/13 0324 HL 0.30: rate increased to 850 units/hr 10/13 1200 HL 0.52 10/13 1957 HL 0.60  Goal of Therapy:  Heparin level 0.3-0.7 units/ml Monitor platelets by anticoagulation protocol: Yes   Plan:  10/14 @ 0500 HL 0.59 therapeutic. Will continue current rate and will recheck next HL w/ am labs. Hgb has gone down one unit 10.3 >> 9.0 will continue to monitor.  10/15 AM heparin level 0.85. Decrease to 750 units/hr and recheck in 8 hours.  Marshella Tello S, Pharm.D. Clinical Pharmacist 02/03/2018,6:38  AM

## 2018-02-04 ENCOUNTER — Encounter
Admission: EM | Disposition: A | Discharge status: Skilled Nursing Facility | Payer: Self-pay | Source: Home / Self Care | Attending: Internal Medicine

## 2018-02-04 ENCOUNTER — Inpatient Hospital Stay (HOSPITAL_COMMUNITY)
Admit: 2018-02-04 | Discharge: 2018-02-04 | Disposition: A | Discharge status: Home / Self Care | Payer: Medicare Other | Attending: Internal Medicine | Admitting: Internal Medicine

## 2018-02-04 DIAGNOSIS — T827XXA Infection and inflammatory reaction due to other cardiac and vascular devices, implants and grafts, initial encounter: Secondary | ICD-10-CM

## 2018-02-04 DIAGNOSIS — B958 Unspecified staphylococcus as the cause of diseases classified elsewhere: Secondary | ICD-10-CM

## 2018-02-04 DIAGNOSIS — I5030 Unspecified diastolic (congestive) heart failure: Secondary | ICD-10-CM

## 2018-02-04 HISTORY — PX: DIALYSIS/PERMA CATHETER REMOVAL: CATH118289

## 2018-02-04 LAB — GLUCOSE, CAPILLARY
Glucose-Capillary: 143 mg/dL — ABNORMAL HIGH (ref 70–99)
Glucose-Capillary: 145 mg/dL — ABNORMAL HIGH (ref 70–99)
Glucose-Capillary: 172 mg/dL — ABNORMAL HIGH (ref 70–99)

## 2018-02-04 LAB — RENAL FUNCTION PANEL
Albumin: 2 g/dL — ABNORMAL LOW (ref 3.5–5.0)
Anion gap: 12 (ref 5–15)
BUN: 50 mg/dL — ABNORMAL HIGH (ref 8–23)
CO2: 26 mmol/L (ref 22–32)
Calcium: 8.3 mg/dL — ABNORMAL LOW (ref 8.9–10.3)
Chloride: 97 mmol/L — ABNORMAL LOW (ref 98–111)
Creatinine, Ser: 9.1 mg/dL — ABNORMAL HIGH (ref 0.44–1.00)
GFR calc Af Amer: 5 mL/min — ABNORMAL LOW (ref >60)
GFR calc non Af Amer: 4 mL/min — ABNORMAL LOW (ref >60)
Glucose, Bld: 131 mg/dL — ABNORMAL HIGH (ref 70–99)
Phosphorus: 4.3 mg/dL (ref 2.5–4.6)
Potassium: 4.5 mmol/L (ref 3.5–5.1)
Sodium: 135 mmol/L (ref 135–145)

## 2018-02-04 LAB — CBC
HCT: 28 % — ABNORMAL LOW (ref 36.0–46.0)
Hemoglobin: 8.3 g/dL — ABNORMAL LOW (ref 12.0–15.0)
MCH: 29.4 pg (ref 26.0–34.0)
MCHC: 29.6 g/dL — ABNORMAL LOW (ref 30.0–36.0)
MCV: 99.3 fL (ref 80.0–100.0)
Platelets: 173 10*3/uL (ref 150–400)
RBC: 2.82 MIL/uL — ABNORMAL LOW (ref 3.87–5.11)
RDW: 19.7 % — ABNORMAL HIGH (ref 11.5–15.5)
WBC: 18 10*3/uL — ABNORMAL HIGH (ref 4.0–10.5)
nRBC: 0 % (ref 0.0–0.2)

## 2018-02-04 LAB — CULTURE, BLOOD (ROUTINE X 2)
Culture: NO GROWTH
Special Requests: ADEQUATE

## 2018-02-04 LAB — HEPARIN LEVEL (UNFRACTIONATED)
Heparin Unfractionated: 0.54 IU/mL (ref 0.30–0.70)
Heparin Unfractionated: 1.08 IU/mL — ABNORMAL HIGH (ref 0.30–0.70)

## 2018-02-04 SURGERY — DIALYSIS/PERMA CATHETER REMOVAL
Anesthesia: LOCAL | Laterality: Right

## 2018-02-04 MED ORDER — HEPARIN (PORCINE) IN NACL 100-0.45 UNIT/ML-% IJ SOLN
650.0000 [IU]/h | INTRAMUSCULAR | Status: DC
  Administered 2018-02-04 – 2018-02-05 (×2): 650 [IU]/h via INTRAVENOUS
  Filled 2018-02-04: qty 250

## 2018-02-04 MED ORDER — SODIUM THIOSULFATE 25 % IV SOLN
25.0000 g | Freq: Once | INTRAVENOUS | Status: AC
  Administered 2018-02-04: 25 g via INTRAVENOUS
  Filled 2018-02-04: qty 100

## 2018-02-04 MED ORDER — LIDOCAINE-EPINEPHRINE (PF) 1 %-1:200000 IJ SOLN
INTRAMUSCULAR | Status: DC | PRN
  Administered 2018-02-04: 10 mL

## 2018-02-04 SURGICAL SUPPLY — 2 items
FORCEPS HALSTEAD CVD 5IN STRL (INSTRUMENTS) ×2 IMPLANT
TRAY LACERAT/PLASTIC (MISCELLANEOUS) ×2 IMPLANT

## 2018-02-04 NOTE — Progress Notes (Signed)
Pre HD assessment    02/04/18 0904  Neurological  Level of Consciousness Alert  Orientation Level Oriented X4  Respiratory  Respiratory Pattern Regular;Unlabored  Chest Assessment Chest expansion symmetrical  Cardiac  ECG Monitor Yes  Cardiac Rhythm NSR  Vascular  R Radial Pulse +2  L Radial Pulse +2  Integumentary  Integumentary (WDL) X  Skin Color Appropriate for ethnicity  Musculoskeletal  Musculoskeletal (WDL) X  Generalized Weakness Yes  Assistive Device None  GU Assessment  Genitourinary (WDL) X  Genitourinary Symptoms  (HD)  Psychosocial  Psychosocial (WDL) WDL

## 2018-02-04 NOTE — Care Management Important Message (Signed)
Copy of signed IM left in patient's room (out for dialysis).

## 2018-02-04 NOTE — Plan of Care (Signed)

## 2018-02-04 NOTE — Progress Notes (Signed)
PT Cancellation Note  Patient Details Name: Stephanie Ellis MRN: [id number] DOB: 05/01/1950   Cancelled Treatment:    Reason Eval/Treat Not Completed: Other (comment). Pt currently out of room for HD at this time. Will re-attempt next available date.   Lydiann Bonifas 02/04/2018, 9:18 AM  Greggory Stallion, PT, DPT [phone]

## 2018-02-04 NOTE — Progress Notes (Signed)
Pre HD assessment   02/04/18 0854  Vital Signs  Temp 98 F (36.7 C)  Temp Source Oral  Pulse Rate 67  Pulse Rate Source Monitor  Resp 18  BP (!) 151/54  BP Location Right Arm  BP Method Automatic  Patient Position (if appropriate) Lying  Oxygen Therapy  SpO2 99 %  O2 Device Nasal Cannula  O2 Flow Rate (L/min) 2 L/min  Pain Assessment  Pain Scale 0-10  Pain Score 0  Dialysis Weight  Weight 57.9 kg  Type of Weight Pre-Dialysis  Time-Out for Hemodialysis  What Procedure? HD  Pt Identifiers(min of two) First/Last Name;MRN/Account#  Correct Site? Yes  Correct Side? Yes  Correct Procedure? Yes  Consents Verified? Yes  Rad Studies Available? N/A  Safety Precautions Reviewed? Yes  Engineer, civil (consulting) Number  (3A)  Station Number 4  UF/Alarm Test Passed  Conductivity: Meter 13.6  Conductivity: Machine  13.8  pH 7.4  Reverse Osmosis main  Normal Saline Lot Number [id number]  Dialyzer Lot Number [id number]  Disposable Set Lot Number [id number]  Machine Temperature 98.6 F (37 C)  Musician and Audible Yes  Blood Lines Intact and Secured Yes  Pre Treatment Patient Checks  Vascular access used during treatment Catheter  Hepatitis B Surface Antigen Results Negative  Date Hepatitis B Surface Antigen Drawn 11/26/17  Hepatitis B Surface Antibody  (>10)  Date Hepatitis B Surface Antibody Drawn 11/26/17  Hemodialysis Consent Verified Yes  Hemodialysis Standing Orders Initiated Yes  ECG (Telemetry) Monitor On Yes  Prime Ordered Normal Saline  Length of  DialysisTreatment -hour(s) 3.5 Hour(s)  Dialyzer Elisio 17H NR  Dialysis Anticoagulant None  Dialysate Flow Ordered 800  Blood Flow Rate Ordered 400 mL/min  Ultrafiltration Goal 1.5 Liters  Pre Treatment Labs Renal panel (heparin level )  Dialysis Blood Pressure Support Ordered Normal Saline  Education / Care Plan  Dialysis Education Provided Yes  Documented Education in Care Plan Yes  Hemodialysis Catheter Right  Subclavian  No Placement Date or Time found.   Placed prior to admission: Yes  Orientation: Right  Access Location: Subclavian  Site Condition No complications  Blue Lumen Status Heparin locked  Red Lumen Status Heparin locked  Purple Lumen Status N/A  Dressing Type Biopatch  Dressing Status Clean;Dry;Dressing reinforced  Interventions Dressing reinforced  Drainage Description None

## 2018-02-04 NOTE — Op Note (Signed)
  OPERATIVE NOTE   PROCEDURE: 1. Removal of a right IJ tunneled dialysis catheter  PRE-OPERATIVE DIAGNOSIS: Complication of dialysis catheter, End stage renal disease  POST-OPERATIVE DIAGNOSIS: Same  SURGEON: Hortencia Pilar, M.D.  ANESTHESIA: Local anesthetic with 1% lidocaine with epinephrine   ESTIMATED BLOOD LOSS: Minimal   FINDING(S): 1. Catheter intact   SPECIMEN(S):  Catheter  INDICATIONS:   Stephanie Ellis is a 67 y.o. female who presents with staph bacteremia.  Therefore is undergoing removal of his tunneled catheter which is no longer needed to avoid septic complications.  Risks and benefits were reviewed with family all questions were answered all are in agreement with proceeding.   DESCRIPTION: After obtaining full informed written consent, the patient was positioned supine. The right IJ tunneled catheter and surrounding area is prepped and draped in a sterile fashion. The cuff was localized by palpation and noted to be less than 3 cm from the exit site. After appropriate timeout is called, 1% lidocaine with epinephrine is infiltrated into the surrounding tissues around the cuff. Small transverse incision is created at the exit site with an 11 blade scalpel and the dissection was carried up along the catheter to expose the cuff of the tunneled catheter.  The catheter cuff is then freed from the surrounding attachments and adhesions. Once the catheter has been freed circumferentially it is removed in 1 piece. Light pressure was held at the base of the neck.   Antibiotic ointment and a sterile dressing is applied to the exit site. Patient tolerated procedure well and there were no complications.  COMPLICATIONS: None  CONDITION: Unchanged  Hortencia Pilar, M.D. [facility] Office: [phone]  02/04/2018,5:42 PM

## 2018-02-04 NOTE — Progress Notes (Signed)
The patient refused blood draw after returning from dialysis. Dr. Vianne Bulls was notified and talked to pharmacy for Next CBC to be draw tomorrow morning.

## 2018-02-04 NOTE — Progress Notes (Signed)
[facility]  Daily Progress Note   Subjective  - Day of Surgery  Patient has become progressively more lethargic.  She now has positive blood cultures for staph  Objective Vitals:   02/04/18 1248 02/04/18 1251 02/04/18 1338 02/04/18 1550  BP: (!) 123/47 (!) 121/45 (!) 139/47 (!) 103/58  Pulse: 72 73 72 73  Resp: 12 10  16   Temp: 97.9 F (36.6 C)  98.4 F (36.9 C) 98.3 F (36.8 C)  TempSrc: Oral  Oral Oral  SpO2: 96% 93% 96% 92%  Weight: 56.6 kg     Height:        Intake/Output Summary (Last 24 hours) at 02/04/2018 1633 Last data filed at 02/04/2018 1419 Gross per 24 hour  Intake 413.35 ml  Output 1504 ml  Net -1090.65 ml    PULM  Normal effort , no use of accessory muscles CV  No JVD, RRR Abd      No distended, nontender VASC  right IJ tunnel catheter nontender; patient has become very lethargic and is having difficulty answering simple questions  Laboratory CBC    Component Value Date/Time   WBC 18.0 (H) 02/04/2018 0107   HGB 8.3 (L) 02/04/2018 0107   HCT 28.0 (L) 02/04/2018 0107   PLT 173 02/04/2018 0107    BMET    Component Value Date/Time   NA 135 02/04/2018 0926   K 4.5 02/04/2018 0926   CL 97 (L) 02/04/2018 0926   CO2 26 02/04/2018 0926   GLUCOSE 131 (H) 02/04/2018 0926   BUN 50 (H) 02/04/2018 0926   CREATININE 9.10 (H) 02/04/2018 0926   CALCIUM 8.3 (L) 02/04/2018 0926   GFRNONAA 4 (L) 02/04/2018 0926   GFRAA 5 (L) 02/04/2018 0926    Assessment/Planning:   Staph bacteremia with sepsis:  The most likely source is the patient's catheter and therefore the catheter will be removed.  She will go 24 hours without any catheter-based access and then we can reassess placement of a temporary catheter or restarting her PD.    Risks and benefits of been reviewed family is in agreement with proceeding.    Stephanie Ellis  02/04/2018, 4:33 PM

## 2018-02-04 NOTE — Progress Notes (Signed)
[facility]  ROUNDING NOTE   Subjective:   Stephanie Ellis admitted to [facility] on 01/29/2018 for Peripheral vascular disease [facility]) [I73.9] Right leg pain [M79.604] Left leg pain [M79.605] Ulcer of great toe, left, with unspecified severity ([place]) [L97.529]   Patient was seen during dialysis.  Tolerating well Appetite remains somewhat poor.  States she is trying to eat more  Objective:  Vital signs in last 24 hours:  Temp:  [97.9 F (36.6 C)-98.4 F (36.9 C)] 98.4 F (36.9 C) (10/16 1338) Pulse Rate:  [66-73] 72 (10/16 1338) Resp:  [9-22] 10 (10/16 1251) BP: (94-154)/(41-59) 139/47 (10/16 1338) SpO2:  [93 %-100 %] 96 % (10/16 1338) Weight:  [56.6 kg-57.9 kg] 56.6 kg (10/16 1248)  Weight change: -2.356 kg Filed Weights   02/04/18 0500 02/04/18 0854 02/04/18 1248  Weight: 57.2 kg 57.9 kg 56.6 kg    Intake/Output: I/O last 3 completed shifts: In: 2337.4 [P.O.:1200; I.V.:1037.4; IV Piggyback:100] Out: 0    Intake/Output this shift:  Total I/O In: 36.5 [I.V.:36.5] Out: 1504 [Other:1504]  Physical Exam: General: NAD, laying in bed, chronically ill-appearing  Head: Normocephalic, atraumatic. Moist oral mucosal membranes  Eyes: Anicteric   Neck: Supple,    Lungs:  Clear to auscultation  Heart: Regular rate and rhythm  Abdomen:  Soft, nontender,   Extremities:  left first toe with tenderness and ischemic changes, discoloration    Neurologic: Alert, oreinted  Access: PD catheter, RIJ permcath    Basic Metabolic Panel: Recent Labs  Lab 01/29/18 1722 01/30/18 0627 01/30/18 1345 02/02/18 0410 02/04/18 0926  NA 135 135 139  --  135  K 4.9 6.1* 3.2*  --  4.5  CL 91* 93* 98  --  97*  CO2 26 25 27   --  26  GLUCOSE 216* 195* 122*  --  131*  BUN 93* 97* 35*  --  50*  CREATININE 15.41* 16.71* 6.72*  --  9.10*  CALCIUM 8.6* 8.2* 8.2*  --  8.3*  PHOS  --   --   --  9.1* 4.3    Liver Function Tests: Recent Labs  Lab 02/04/18 0926  ALBUMIN 2.0*   No  results for input(s): LIPASE, AMYLASE in the last 168 hours. No results for input(s): AMMONIA in the last 168 hours.  CBC: Recent Labs  Lab 01/31/18 0351 02/01/18 0324 02/02/18 0410 02/03/18 0524 02/04/18 0107  WBC 14.9* 18.5* 19.9* 16.5* 18.0*  HGB 10.6* 10.3* 9.0* 8.7* 8.3*  HCT 35.1* 34.0* 29.3* 28.8* 28.0*  MCV 99.2 98.0 97.3 99.0 99.3  PLT 157 167 170 159 173    Cardiac Enzymes: No results for input(s): CKTOTAL, CKMB, CKMBINDEX, TROPONINI in the last 168 hours.  BNP: Invalid input(s): POCBNP  CBG: Recent Labs  Lab 02/03/18 1203 02/03/18 1646 02/03/18 2132 02/04/18 0741 02/04/18 1412  GLUCAP 191* 216* 152* 143* 145*    Microbiology: Results for orders placed or performed during the hospital encounter of 01/29/18  Blood Culture (routine x 2)     Status: Abnormal   Collection Time: 01/29/18  9:55 PM  Result Value Ref Range Status   Specimen Description   Final    BLOOD RIGHT ANTECUBITAL Performed at [facility], [address], [place], [place] [address]    Special Requests   Final    BOTTLES DRAWN AEROBIC AND ANAEROBIC Blood Culture adequate volume Performed at [facility], [address] [place], [place] [address]    Culture  Setup Time   Final  GRAM POSITIVE COCCI IN CLUSTERS IN BOTH AEROBIC AND ANAEROBIC BOTTLES CRITICAL RESULT CALLED TO, READ BACK BY AND VERIFIED WITH: KAREN HAYES 01/30/18 0839 JGF Performed at [facility], [address] [place], [place] [address]    Culture (A)  Final    STAPHYLOCOCCUS AUREUS SUSCEPTIBILITIES PERFORMED ON PREVIOUS CULTURE WITHIN THE LAST 5 DAYS. Performed at [facility], [place] [address], [place], [place] [address]    Report Status 02/01/2018 FINAL  Final  Blood Culture (routine x 2)     Status: Abnormal   Collection Time: 01/29/18  9:55 PM  Result Value Ref Range Status   Specimen Description   Final    BLOOD RIGHT ANTECUBITAL Performed at [facility], [address], [place], [place] [address]    Special Requests   Final    BOTTLES DRAWN AEROBIC AND ANAEROBIC Blood Culture adequate volume Performed at [facility], [address] [place], [place] [address]    Culture  Setup Time   Final    GRAM POSITIVE COCCI IN CLUSTERS IN BOTH AEROBIC AND ANAEROBIC BOTTLES CRITICAL RESULT CALLED TO, READ BACK BY AND VERIFIED WITH: KAREN HAYES 01/30/18 0839 JGF Performed at [facility], [address], [place], [place] [address]    Culture STAPHYLOCOCCUS AUREUS (A)  Final   Report Status 02/01/2018 FINAL  Final   Organism ID, Bacteria STAPHYLOCOCCUS AUREUS  Final      Susceptibility   Staphylococcus aureus - MIC*    CIPROFLOXACIN <=0.5 SENSITIVE Sensitive     ERYTHROMYCIN <=0.25 SENSITIVE Sensitive     GENTAMICIN <=0.5 SENSITIVE Sensitive     OXACILLIN 0.5 SENSITIVE Sensitive     TETRACYCLINE <=1 SENSITIVE Sensitive     VANCOMYCIN <=0.5 SENSITIVE Sensitive     TRIMETH/SULFA <=10 SENSITIVE Sensitive     CLINDAMYCIN <=0.25 SENSITIVE Sensitive     RIFAMPIN <=0.5 SENSITIVE Sensitive     Inducible Clindamycin NEGATIVE Sensitive     * STAPHYLOCOCCUS AUREUS  Blood Culture ID Panel (Reflexed)     Status: Abnormal   Collection Time: 01/29/18  9:55 PM  Result Value Ref Range Status   Enterococcus species NOT DETECTED NOT DETECTED Final   Vancomycin resistance NOT DETECTED NOT DETECTED Final   Listeria monocytogenes NOT DETECTED NOT DETECTED Final   Staphylococcus species DETECTED (A) NOT DETECTED Final    Comment: CRITICAL RESULT CALLED TO, READ BACK BY AND VERIFIED WITH: KAREN HAYES 01/30/18 0839 JGF    Staphylococcus aureus (BCID) DETECTED (A) NOT DETECTED Final    Comment: CRITICAL RESULT CALLED TO, READ BACK BY AND VERIFIED WITH: KAREN HAYES 01/30/18 0839 JGF Methicillin (oxacillin) susceptible Staphylococcus aureus (MSSA). Preferred therapy is anti staphylococcal beta lactam antibiotic (Cefazolin or Nafcillin), unless clinically  contraindicated.    Methicillin resistance NOT DETECTED NOT DETECTED Final   Streptococcus species NOT DETECTED NOT DETECTED Final   Streptococcus agalactiae NOT DETECTED NOT DETECTED Final   Streptococcus pneumoniae NOT DETECTED NOT DETECTED Final   Streptococcus pyogenes NOT DETECTED NOT DETECTED Final   Acinetobacter baumannii NOT DETECTED NOT DETECTED Final   Enterobacteriaceae species NOT DETECTED NOT DETECTED Final   Enterobacter cloacae complex NOT DETECTED NOT DETECTED Final   Escherichia coli NOT DETECTED NOT DETECTED Final   Klebsiella oxytoca NOT DETECTED NOT DETECTED Final   Klebsiella pneumoniae NOT DETECTED NOT DETECTED Final   Proteus species NOT DETECTED NOT DETECTED Final   Serratia marcescens NOT DETECTED NOT DETECTED Final   Carbapenem resistance NOT DETECTED NOT  DETECTED Final   Haemophilus influenzae NOT DETECTED NOT DETECTED Final   Neisseria meningitidis NOT DETECTED NOT DETECTED Final   Pseudomonas aeruginosa NOT DETECTED NOT DETECTED Final   Candida albicans NOT DETECTED NOT DETECTED Final   Candida glabrata NOT DETECTED NOT DETECTED Final   Candida krusei NOT DETECTED NOT DETECTED Final   Candida parapsilosis NOT DETECTED NOT DETECTED Final   Candida tropicalis NOT DETECTED NOT DETECTED Final    Comment: Performed at [facility], [address] [place], [place] [address]  MRSA PCR Screening     Status: None   Collection Time: 01/29/18 10:55 PM  Result Value Ref Range Status   MRSA by PCR NEGATIVE NEGATIVE Final    Comment:        The GeneXpert MRSA Assay (FDA approved for NASAL specimens only), is one component of a comprehensive MRSA colonization surveillance program. It is not intended to diagnose MRSA infection nor to guide or monitor treatment for MRSA infections. Performed at [facility], [address] [place], [place] [address]   Culture, blood (Routine X 2) w Reflex to ID Panel     Status: None   Collection Time:  01/30/18  9:27 AM  Result Value Ref Range Status   Specimen Description BLOOD RIGHT ANTECUBITAL  Final   Special Requests   Final    BOTTLES DRAWN AEROBIC AND ANAEROBIC Blood Culture adequate volume   Culture   Final    NO GROWTH 5 DAYS Performed at [facility], [address] [place], [place] [address]    Report Status 02/04/2018 FINAL  Final    Coagulation Studies: No results for input(s): LABPROT, INR in the last 72 hours.  Urinalysis: No results for input(s): COLORURINE, LABSPEC, PHURINE, GLUCOSEU, HGBUR, BILIRUBINUR, KETONESUR, PROTEINUR, UROBILINOGEN, NITRITE, LEUKOCYTESUR in the last 72 hours.  Invalid input(s): APPERANCEUR    Imaging: No results found.   Medications:   .  ceFAZolin (ANCEF) IV Stopped (02/03/18 2306)  . heparin 650 Units/hr (02/04/18 0800)   . acetaminophen  650 mg Oral Q8H  . aspirin EC  81 mg Oral Daily  . atorvastatin  80 mg Oral QHS  . Atropine Sulfate-NaCl  1 drop Ophthalmic BID  . carvedilol  3.125 mg Oral BID WC  . Chlorhexidine Gluconate Cloth  6 each Topical Q0600  . cholecalciferol  400 Units Oral Once per day on Mon Wed Fri  . docusate sodium  100 mg Oral BID  . epoetin (EPOGEN/PROCRIT) injection  4,000 Units Intravenous Q M,W,F-HD  . famotidine  20 mg Oral Daily  . feeding supplement (NEPRO CARB STEADY)  237 mL Oral BID BM  . insulin aspart  0-5 Units Subcutaneous QHS  . insulin aspart  0-9 Units Subcutaneous TID WC  . irbesartan  75 mg Oral Daily  . isosorbide mononitrate  30 mg Oral Daily  . multivitamin  1 tablet Oral QHS  . multivitamin with minerals  1 tablet Oral Daily  . multivitamin-lutein  1 capsule Oral Daily   acetaminophen **OR** acetaminophen, bisacodyl, HYDROmorphone (DILAUDID) injection, metoCLOPramide (REGLAN) injection, nitroGLYCERIN, ondansetron **OR** ondansetron (ZOFRAN) IV, oxyCODONE, traZODone  Assessment/ Plan:  Stephanie Ellis is a 8 y.o. black female with end stage renal disease on  peritoneal dialysis, hypertension, congestive heart failure, coronary artery disease status post CABG, diabetes mellitus type II insulin dependent, GERD, peripheral vascular disease admitted to [facility] on 01/29/2018 for Peripheral vascular disease ([place]) [I73.9] Right leg pain [M79.604] Left leg pain [M79.605] Ulcer of great  toe, left, with unspecified severity [facility]) [L97.529]   [facility] CCPD 10 hours 5 exchanges 2 liter fills   1. End Stage Renal Disease with hyperkalemia: missed several days of peritoneal dialysis.  Does not want to do PD anymore as outpatient - will continue Hemodialysis tentatively M-W-F - will need to be scheduled for outpatient hemodialysis  2.  Staphylococcus aureus bacteremia -Concern for tunneled dialysis catheter infection.  Discussed with Dr. Delana Meyer from vascular surgery to remove tunneled dialysis catheter.  Patient will get temporary catheter until her surveillance blood cultures are negative.  3. Anemia of chronic kidney disease: hemoglobin 8.3 EPO with HD  4. Secondary Hyperparathyroidism:  - sevelamer with meals.  - Hold for now because appetite is poor - Phons 4.3  5. Diabetes mellitus type II with chronic kidney disease: insulin dependent -  Continue glucose control   6. Peripheral vascular disease: left first toe with gangrene and pain.  - underwent limb salvage angioplasty on 01/31/11     LOS: 6 Leisa Gault 10/16/20193:45 PM

## 2018-02-04 NOTE — Progress Notes (Signed)
HD tx start    02/04/18 0903  Vital Signs  Pulse Rate 68  Pulse Rate Source Monitor  Resp 16  BP (!) 154/54  BP Location Right Arm  BP Method Automatic  Patient Position (if appropriate) Lying  Oxygen Therapy  SpO2 100 %  O2 Device Nasal Cannula  O2 Flow Rate (L/min) 2 L/min  During Hemodialysis Assessment  Blood Flow Rate (mL/min) 400 mL/min  Arterial Pressure (mmHg) -190 mmHg  Venous Pressure (mmHg) 170 mmHg  Transmembrane Pressure (mmHg) 50 mmHg  Ultrafiltration Rate (mL/min) 570 mL/min  Dialysate Flow Rate (mL/min) 800 ml/min  Conductivity: Machine  13.7  HD Safety Checks Performed Yes  Dialysis Fluid Bolus Normal Saline  Bolus Amount (mL) 250 mL  Intra-Hemodialysis Comments Tx initiated  Hemodialysis Catheter Right Subclavian  No Placement Date or Time found.   Placed prior to admission: Yes  Orientation: Right  Access Location: Subclavian  Blue Lumen Status Infusing  Red Lumen Status Infusing

## 2018-02-04 NOTE — Progress Notes (Signed)
ANTICOAGULATION CONSULT NOTE - Follow Up Consult  Pharmacy Consult for Heparin Indication: arterial embolism  Allergies  Allergen Reactions  . Chlorthalidone Anaphylaxis, Itching and Rash  . Fentanyl Rash  . Midazolam Rash  . Ace Inhibitors Other (See Comments)    Reaction:  Hyperkalemia, agitation   . Angiotensin Receptor Blockers Other (See Comments)    Reaction:  Hyperkalemia, agitation   . Norvasc [Amlodipine] Itching and Rash  . Phenergan [Promethazine Hcl] Anxiety    "antsy, can't sit still"    Patient Measurements: Height: 5' 4"  (162.6 cm) Weight: 124 lb 12.5 oz (56.6 kg) IBW/kg (Calculated) : 54.7 Heparin Dosing Weight: 56.7 kg  Vital Signs: Temp: 98.3 F (36.8 C) (10/16 1550) Temp Source: Oral (10/16 1550) BP: 117/50 (10/16 1730) Pulse Rate: 77 (10/16 1730)  Labs: Recent Labs    02/02/18 0410  02/03/18 0524 02/03/18 1516 02/04/18 0107 02/04/18 0926  HGB 9.0*  --  8.7*  --  8.3*  --   HCT 29.3*  --  28.8*  --  28.0*  --   PLT 170  --  159  --  173  --   HEPARINUNFRC 0.59   < >  --  0.81* 0.54 1.08*  CREATININE  --   --   --   --   --  9.10*   < > = values in this interval not displayed.    Estimated Creatinine Clearance: 5.3 mL/min (A) (by C-G formula based on SCr of 9.1 mg/dL (H)).  Assessment: 61 yof now s/p peripheral vascular catheterization with arterial embolism. Pharmacy consulted to dose heparin. No initial bolus due to receiving heparin 5000 units IV x 1 in angio suite per Dr. Delana Meyer. Hemoglobin is trending down following limb salvage angioplasty 10/11 but stabilizing.Epo ordered with HD  Heparin was started at 800 units/hr 10/12 0351 HL 0.47:  10/12 1307 HL 0.46 10/13 0324 HL 0.30: rate increased to 850 units/hr 10/13 1200 HL 0.52 10/13 1957 HL 0.60 10/14 0410 HL 0.59 10/15 AM    HL 0.85: decrease rate to 750 units/hr 10/15 1516 HL 0.81: decrease rate to 650 units/hr 10/16 0100 HL 0.54 10/16 0926 HL 1.01: question validity of value,  order placed for redraw but patient refused and then went to vascular lab  Goal of Therapy:  Heparin level 0.3-0.7 units/ml Monitor platelets by anticoagulation protocol: Yes   Plan:  After vascular procedure MD requested that Heparin be resumed at 19:00 at previous rate of 650 units/hr without a bolus. Will check HL 8 hours after restart.  Paulina Fusi, PharmD, BCPS 02/04/2018 7:43 PM

## 2018-02-04 NOTE — Progress Notes (Signed)
HD tx end    02/04/18 1241  Vital Signs  Pulse Rate 69  Pulse Rate Source Monitor  Resp 11  BP (!) 116/50  BP Location Right Arm  BP Method Automatic  Patient Position (if appropriate) Lying  Oxygen Therapy  SpO2 97 %  O2 Device Room Air  During Hemodialysis Assessment  Dialysis Fluid Bolus Normal Saline  Bolus Amount (mL) 250 mL  Intra-Hemodialysis Comments Tx completed

## 2018-02-04 NOTE — Progress Notes (Signed)
[place] for heparin Indication: arterial embolism  Allergies  Allergen Reactions  . Chlorthalidone Anaphylaxis, Itching and Rash  . Fentanyl Rash  . Midazolam Rash  . Ace Inhibitors Other (See Comments)    Reaction:  Hyperkalemia, agitation   . Angiotensin Receptor Blockers Other (See Comments)    Reaction:  Hyperkalemia, agitation   . Norvasc [Amlodipine] Itching and Rash  . Phenergan [Promethazine Hcl] Anxiety    "antsy, can't sit still"    Patient Measurements: Height: 5' 4"  (162.6 cm) Weight: 125 lb (56.7 kg) IBW/kg (Calculated) : 54.7 Heparin Dosing Weight: 56.7 kg  Vital Signs: Temp: 98.1 F (36.7 C) (10/15 2027) Temp Source: Oral (10/15 2027) BP: 127/52 (10/15 2027) Pulse Rate: 72 (10/15 2027)  Labs: Recent Labs    02/02/18 0410 02/03/18 0523 02/03/18 0524 02/03/18 1516 02/04/18 0107  HGB 9.0*  --  8.7*  --  8.3*  HCT 29.3*  --  28.8*  --  28.0*  PLT 170  --  159  --  173  HEPARINUNFRC 0.59 0.85*  --  0.81* 0.54    Estimated Creatinine Clearance: 7.1 mL/min (A) (by C-G formula based on SCr of 6.72 mg/dL (H)).   Assessment: 89 yof now s/p peripheral vascular catheterization with arterial embolism. Pharmacy consulted to dose heparin. No initial bolus due to receiving heparin 5000 units IV x 1 in angio suite per Dr. Delana Meyer. Hemoglobin is trending down following limb salvage angioplasty 10/11 but stabilizing.Epo ordered with HD  Heparin was started at 800 units/hr 10/12 0351 HL 0.47:  10/12 1307 HL 0.46 10/13 0324 HL 0.30: rate increased to 850 units/hr 10/13 1200 HL 0.52 10/13 1957 HL 0.60 10/14 0410 HL 0.59 10/15 AM    HL 0.85: decrease rate to 750 units/hr   Goal of Therapy:  Heparin level 0.3-0.7 units/ml Monitor platelets by anticoagulation protocol: Yes   Plan:  10/15 1516 heparin level 0.81. RN confirms heparin drip running at 7.5 ml/hr; no s/sx of bleeding noted.  Decrease to 650 units/hr and recheck  in 8 hours. CBC in AM.   10/16 0100 heparin level 0.54. Continue current regimen. Recheck in 6 hours to confirm.  Pharmacy will continue to follow.   Senora Lacson S, Pharm.D, BCPS Clinical Pharmacist 02/04/2018,3:46 AM

## 2018-02-04 NOTE — Progress Notes (Signed)
Post HD assessment    02/04/18 1246  Neurological  Level of Consciousness Alert  Orientation Level Oriented X4  Respiratory  Respiratory Pattern Regular;Unlabored  Chest Assessment Chest expansion symmetrical  Cardiac  ECG Monitor Yes  Cardiac Rhythm NSR  Vascular  R Radial Pulse +2  L Radial Pulse +2  Integumentary  Integumentary (WDL) X  Skin Color Appropriate for ethnicity  Musculoskeletal  Musculoskeletal (WDL) X  Generalized Weakness Yes  Assistive Device None  GU Assessment  Genitourinary (WDL) X  Genitourinary Symptoms  (HD)  Psychosocial  Psychosocial (WDL) WDL

## 2018-02-04 NOTE — Progress Notes (Signed)
[place] at [facility]                                                                                                                                                                                  Patient Demographics   Stephanie Ellis, is a 67 y.o. female, DOB - Dec 26, 1950, VQX:[id number]  Admit date - 01/29/2018   Admitting Physician Amelia Jo, MD  Outpatient Primary MD for the patient is System, Pcp Not In   LOS - 6  Subjective: Nausea today again. Review of Systems:   CONSTITUTIONAL: No documented fever. No fatigue, weakness. No weight gain, no weight loss.  EYES: No blurry or double vision.  ENT: No tinnitus. No postnasal drip. No redness of the oropharynx.  RESPIRATORY: No cough, no wheeze, no hemoptysis. No dyspnea.  CARDIOVASCULAR: No chest pain. No orthopnea. No palpitations. No syncope.  GASTROINTESTINAL: No nausea or abdominal pain today.   GENITOURINARY: No dysuria or hematuria.  ENDOCRINE: No polyuria or nocturia. No heat or cold intolerance.  HEMATOLOGY: No anemia. No bruising. No bleeding.  INTEGUMENTARY: No rashes. No lesions.  MUSCULOSKELETAL: Left great toe is black.  Some bullae noted on the left dorsum of the left foot. NEUROLOGIC: No numbness, tingling, or ataxia. No seizure-type activity.  PSYCHIATRIC: No anxiety. No insomnia. No ADD.    Vitals:   Vitals:   02/04/18 1241 02/04/18 1248 02/04/18 1251 02/04/18 1338  BP: (!) 116/50 (!) 123/47 (!) 121/45 (!) 139/47  Pulse: 69 72 73 72  Resp: 11 12 10    Temp:  97.9 F (36.6 C)  98.4 F (36.9 C)  TempSrc:  Oral  Oral  SpO2: 97% 96% 93% 96%  Weight:  56.6 kg    Height:        Wt Readings from Last 3 Encounters:  02/04/18 56.6 kg  10/15/17 58.1 kg  10/09/17 58.1 kg     Intake/Output Summary (Last 24 hours) at 02/04/2018 1351 Last data filed at 02/04/2018 1248 Gross per 24 hour  Intake 916.83 ml  Output 1504 ml  Net -587.17 ml    Physical Exam:   GENERAL:  Pleasant-appearing in no apparent distress.  HEAD, EYES, EARS, NOSE AND THROAT: Atraumatic, normocephalic. Extraocular muscles are intact. Pupils equal and reactive to light. Sclerae anicteric. No conjunctival injection. No oro-pharyngeal erythema.  NECK: Supple. There is no jugular venous distention. No bruits, no lymphadenopathy, no thyromegaly.  HEART: Regular rate and rhythm,. No murmurs, no rubs, no clicks.  LUNGS: Clear to auscultation bilaterally. No rales or rhonchi. No wheezes.  ABDOMEN: Soft, flat, nontender, nondistended. Has good bowel sounds. No hepatosplenomegaly appreciated.  EXTREMITIES: Left great toe with ulcer, still  has cyanosis of the left great toe.  Bullae noted on the dorsum of the left foot.  Left foot warm to touch.  Patient has 1+ palpable dorsalis pedis pulse on the left foot. NEUROLOGIC: The patient is alert, awake, and oriented x3 with no focal motor or sensory deficits appreciated bilaterally.  SKIN: Moist and warm with no rashes appreciated.  Psych: Not anxious, depressed LN: No inguinal LN enlargement    Antibiotics   Anti-infectives (From admission, onward)   Start     Dose/Rate Route Frequency Ordered Stop   01/31/18 2200  ceFAZolin (ANCEF) IVPB 1 g/50 mL premix     1 g 100 mL/hr over 30 Minutes Intravenous Every 24 hours 01/31/18 1409     01/30/18 1800  vancomycin (VANCOCIN) IVPB 1000 mg/200 mL premix  Status:  Discontinued     1,000 mg 200 mL/hr over 60 Minutes Intravenous Every M-W-F (Hemodialysis) 01/30/18 0435 01/30/18 0906   01/30/18 1000  piperacillin-tazobactam (ZOSYN) IVPB 3.375 g  Status:  Discontinued     3.375 g 12.5 mL/hr over 240 Minutes Intravenous Every 12 hours 01/30/18 0435 01/30/18 0906   01/30/18 1000  ceFAZolin (ANCEF) 500 mg in dextrose 5 % 100 mL IVPB  Status:  Discontinued     500 mg 210 mL/hr over 30 Minutes Intravenous Every 12 hours 01/30/18 0906 01/31/18 1409   01/29/18 2200  piperacillin-tazobactam (ZOSYN) IVPB 3.375 g      3.375 g 100 mL/hr over 30 Minutes Intravenous  Once 01/29/18 2146 01/29/18 2242   01/29/18 2200  vancomycin (VANCOCIN) IVPB 1000 mg/200 mL premix     1,000 mg 200 mL/hr over 60 Minutes Intravenous  Once 01/29/18 2146 01/29/18 2314      Medications   Scheduled Meds: . acetaminophen  650 mg Oral Q8H  . aspirin EC  81 mg Oral Daily  . atorvastatin  80 mg Oral QHS  . Atropine Sulfate-NaCl  1 drop Ophthalmic BID  . carvedilol  3.125 mg Oral BID WC  . Chlorhexidine Gluconate Cloth  6 each Topical Q0600  . cholecalciferol  400 Units Oral Once per day on Mon Wed Fri  . docusate sodium  100 mg Oral BID  . epoetin (EPOGEN/PROCRIT) injection  4,000 Units Intravenous Q M,W,F-HD  . famotidine  20 mg Oral Daily  . feeding supplement (NEPRO CARB STEADY)  237 mL Oral BID BM  . insulin aspart  0-5 Units Subcutaneous QHS  . insulin aspart  0-9 Units Subcutaneous TID WC  . irbesartan  75 mg Oral Daily  . isosorbide mononitrate  30 mg Oral Daily  . multivitamin  1 tablet Oral QHS  . multivitamin with minerals  1 tablet Oral Daily  . multivitamin-lutein  1 capsule Oral Daily   Continuous Infusions: .  ceFAZolin (ANCEF) IV Stopped (02/03/18 2306)  . heparin 650 Units/hr (02/04/18 0300)   PRN Meds:.acetaminophen **OR** acetaminophen, bisacodyl, HYDROmorphone (DILAUDID) injection, metoCLOPramide (REGLAN) injection, nitroGLYCERIN, ondansetron **OR** ondansetron (ZOFRAN) IV, oxyCODONE, traZODone   Data Review:   Micro Results Recent Results (from the past 240 hour(s))  Blood Culture (routine x 2)     Status: Abnormal   Collection Time: 01/29/18  9:55 PM  Result Value Ref Range Status   Specimen Description   Final    BLOOD RIGHT ANTECUBITAL Performed at [facility], [address], [place], [place] [address]    Special Requests   Final    BOTTLES DRAWN AEROBIC AND ANAEROBIC Blood Culture adequate volume Performed at [facility], [address] [place], [place]  [address]    Culture  Setup Time   Final    GRAM POSITIVE COCCI IN CLUSTERS IN BOTH AEROBIC AND ANAEROBIC BOTTLES CRITICAL RESULT CALLED TO, READ BACK BY AND VERIFIED WITH: KAREN HAYES 01/30/18 0839 JGF Performed at [facility], [address] [place], [place] [address]    Culture (A)  Final    STAPHYLOCOCCUS AUREUS SUSCEPTIBILITIES PERFORMED ON PREVIOUS CULTURE WITHIN THE LAST 5 DAYS. Performed at [facility], [place] [address] [place], [place] [address]    Report Status 02/01/2018 FINAL  Final  Blood Culture (routine x 2)     Status: Abnormal   Collection Time: 01/29/18  9:55 PM  Result Value Ref Range Status   Specimen Description   Final    BLOOD RIGHT ANTECUBITAL Performed at [facility], [address], [place], [place] [address]    Special Requests   Final    BOTTLES DRAWN AEROBIC AND ANAEROBIC Blood Culture adequate volume Performed at [facility], [address] [place], [place] [address]    Culture  Setup Time   Final    GRAM POSITIVE COCCI IN CLUSTERS IN BOTH AEROBIC AND ANAEROBIC BOTTLES CRITICAL RESULT CALLED TO, READ BACK BY AND VERIFIED WITH: KAREN HAYES 01/30/18 0839 JGF Performed at [facility], [place] [address], [place], [place] [address]    Culture STAPHYLOCOCCUS AUREUS (A)  Final   Report Status 02/01/2018 FINAL  Final   Organism ID, Bacteria STAPHYLOCOCCUS AUREUS  Final      Susceptibility   Staphylococcus aureus - MIC*    CIPROFLOXACIN <=0.5 SENSITIVE Sensitive     ERYTHROMYCIN <=0.25 SENSITIVE Sensitive     GENTAMICIN <=0.5 SENSITIVE Sensitive     OXACILLIN 0.5 SENSITIVE Sensitive     TETRACYCLINE <=1 SENSITIVE Sensitive     VANCOMYCIN <=0.5 SENSITIVE Sensitive     TRIMETH/SULFA <=10 SENSITIVE Sensitive     CLINDAMYCIN <=0.25 SENSITIVE Sensitive     RIFAMPIN <=0.5 SENSITIVE Sensitive     Inducible Clindamycin NEGATIVE Sensitive     * STAPHYLOCOCCUS AUREUS  Blood Culture ID Panel (Reflexed)     Status:  Abnormal   Collection Time: 01/29/18  9:55 PM  Result Value Ref Range Status   Enterococcus species NOT DETECTED NOT DETECTED Final   Vancomycin resistance NOT DETECTED NOT DETECTED Final   Listeria monocytogenes NOT DETECTED NOT DETECTED Final   Staphylococcus species DETECTED (A) NOT DETECTED Final    Comment: CRITICAL RESULT CALLED TO, READ BACK BY AND VERIFIED WITH: KAREN HAYES 01/30/18 0839 JGF    Staphylococcus aureus (BCID) DETECTED (A) NOT DETECTED Final    Comment: CRITICAL RESULT CALLED TO, READ BACK BY AND VERIFIED WITH: KAREN HAYES 01/30/18 0839 JGF Methicillin (oxacillin) susceptible Staphylococcus aureus (MSSA). Preferred therapy is anti staphylococcal beta lactam antibiotic (Cefazolin or Nafcillin), unless clinically contraindicated.    Methicillin resistance NOT DETECTED NOT DETECTED Final   Streptococcus species NOT DETECTED NOT DETECTED Final   Streptococcus agalactiae NOT DETECTED NOT DETECTED Final   Streptococcus pneumoniae NOT DETECTED NOT DETECTED Final   Streptococcus pyogenes NOT DETECTED NOT DETECTED Final   Acinetobacter baumannii NOT DETECTED NOT DETECTED Final   Enterobacteriaceae species NOT DETECTED NOT DETECTED Final   Enterobacter cloacae complex NOT DETECTED NOT DETECTED Final   Escherichia coli NOT DETECTED NOT DETECTED Final   Klebsiella oxytoca NOT DETECTED NOT DETECTED Final   Klebsiella pneumoniae NOT DETECTED NOT DETECTED Final   Proteus species  NOT DETECTED NOT DETECTED Final   Serratia marcescens NOT DETECTED NOT DETECTED Final   Carbapenem resistance NOT DETECTED NOT DETECTED Final   Haemophilus influenzae NOT DETECTED NOT DETECTED Final   Neisseria meningitidis NOT DETECTED NOT DETECTED Final   Pseudomonas aeruginosa NOT DETECTED NOT DETECTED Final   Candida albicans NOT DETECTED NOT DETECTED Final   Candida glabrata NOT DETECTED NOT DETECTED Final   Candida krusei NOT DETECTED NOT DETECTED Final   Candida parapsilosis NOT DETECTED NOT  DETECTED Final   Candida tropicalis NOT DETECTED NOT DETECTED Final    Comment: Performed at [facility], [address] [place], [place] [address]  MRSA PCR Screening     Status: None   Collection Time: 01/29/18 10:55 PM  Result Value Ref Range Status   MRSA by PCR NEGATIVE NEGATIVE Final    Comment:        The GeneXpert MRSA Assay (FDA approved for NASAL specimens only), is one component of a comprehensive MRSA colonization surveillance program. It is not intended to diagnose MRSA infection nor to guide or monitor treatment for MRSA infections. Performed at [facility], [address] [place], [place] [address]   Culture, blood (Routine X 2) w Reflex to ID Panel     Status: None   Collection Time: 01/30/18  9:27 AM  Result Value Ref Range Status   Specimen Description BLOOD RIGHT ANTECUBITAL  Final   Special Requests   Final    BOTTLES DRAWN AEROBIC AND ANAEROBIC Blood Culture adequate volume   Culture   Final    NO GROWTH 5 DAYS Performed at [facility], [address] [place],  [address]    Report Status 02/04/2018 FINAL  Final    Radiology Reports Dg Tibia/fibula Left  Result Date: 01/29/2018 CLINICAL DATA:  Presents with ED via ems from home Having pain to both lower legs But states left leg is worse Increased pain with palpation Decreased pulses to foot EXAM: LEFT TIBIA AND FIBULA - 2 VIEW COMPARISON:  None. FINDINGS: No fracture.  No bone lesion. Knee and ankle joints are normally aligned. There are arterial vascular calcifications. Soft tissues are otherwise unremarkable. IMPRESSION: Negative Electronically Signed   By: Lajean Manes M.D.   On: 01/29/2018 20:58   US Venous Img Lower Unilateral Left  Result Date: 01/29/2018 CLINICAL DATA:  Left leg pain EXAM: LEFT LOWER EXTREMITY VENOUS DOPPLER ULTRASOUND TECHNIQUE: Gray-scale sonography with graded compression, as well as color Doppler and duplex ultrasound were performed to  evaluate the lower extremity deep venous systems from the level of the common femoral vein and including the common femoral, femoral, profunda femoral, popliteal and calf veins including the posterior tibial, peroneal and gastrocnemius veins when visible. The superficial great saphenous vein was also interrogated. Spectral Doppler was utilized to evaluate flow at rest and with distal augmentation maneuvers in the common femoral, femoral and popliteal veins. COMPARISON:  None. FINDINGS: Contralateral Common Femoral Vein: Respiratory phasicity is normal and symmetric with the symptomatic side. No evidence of thrombus. Normal compressibility. Common Femoral Vein: No evidence of thrombus. Normal compressibility, respiratory phasicity and response to augmentation. Saphenofemoral Junction: No evidence of thrombus. Normal compressibility and flow on color Doppler imaging. Profunda Femoral Vein: No evidence of thrombus. Normal compressibility and flow on color Doppler imaging. Femoral Vein: No evidence of thrombus. Normal compressibility, respiratory phasicity and response to augmentation. Popliteal Vein: No evidence of thrombus. Normal compressibility, respiratory phasicity and response to augmentation. Calf Veins: No evidence  of thrombus. Normal compressibility and flow on color Doppler imaging. Superficial Great Saphenous Vein: No evidence of thrombus. Normal compressibility. Venous Reflux:  None. Other Findings:  None. IMPRESSION: No evidence of deep venous thrombosis. Electronically Signed   By: Rolm Baptise M.D.   On: 01/29/2018 18:36   Dg Toe Great Left  Result Date: 01/29/2018 CLINICAL DATA:  Open wound to the left great toe for several months. EXAM: LEFT GREAT TOE COMPARISON:  10/07/2017 FINDINGS: Degenerative changes in the first metatarsal-phalangeal joint. Soft tissue defect over the plantar aspect of the left first toe. There is underlying erosion of the distal phalangeal tuft cortex suggesting focal  osteomyelitis. No evidence of acute fracture or dislocation. Diffuse bone demineralization. Prominent vascular calcifications. IMPRESSION: Soft tissue defect over the plantar aspect of the left first toe with underlying erosion of the distal phalangeal tuft suggesting osteomyelitis. Electronically Signed   By: Lucienne Capers M.D.   On: 01/29/2018 21:30     CBC Recent Labs  Lab 01/31/18 0351 02/01/18 0324 02/02/18 0410 02/03/18 0524 02/04/18 0107  WBC 14.9* 18.5* 19.9* 16.5* 18.0*  HGB 10.6* 10.3* 9.0* 8.7* 8.3*  HCT 35.1* 34.0* 29.3* 28.8* 28.0*  PLT 157 167 170 159 173  MCV 99.2 98.0 97.3 99.0 99.3  MCH 29.9 29.7 29.9 29.9 29.4  MCHC 30.2 30.3 30.7 30.2 29.6*  RDW 18.9* 18.7* 19.3* 19.7* 19.7*    Chemistries  Recent Labs  Lab 01/29/18 1722 01/30/18 0627 01/30/18 1345 02/04/18 0926  NA 135 135 139 135  K 4.9 6.1* 3.2* 4.5  CL 91* 93* 98 97*  CO2 26 25 27 26   GLUCOSE 216* 195* 122* 131*  BUN 93* 97* 35* 50*  CREATININE 15.41* 16.71* 6.72* 9.10*  CALCIUM 8.6* 8.2* 8.2* 8.3*   ------------------------------------------------------------------------------------------------------------------ estimated creatinine clearance is 5.3 mL/min (A) (by C-G formula based on SCr of 9.1 mg/dL (H)). ------------------------------------------------------------------------------------------------------------------ No results for input(s): HGBA1C in the last 72 hours. ------------------------------------------------------------------------------------------------------------------ No results for input(s): CHOL, HDL, LDLCALC, TRIG, CHOLHDL, LDLDIRECT in the last 72 hours. ------------------------------------------------------------------------------------------------------------------ No results for input(s): TSH, T4TOTAL, T3FREE, THYROIDAB in the last 72 hours.  Invalid input(s):  FREET3 ------------------------------------------------------------------------------------------------------------------ No results for input(s): VITAMINB12, FOLATE, FERRITIN, TIBC, IRON, RETICCTPCT in the last 72 hours.  Coagulation profile Recent Labs  Lab 01/29/18 2136  INR 1.14    No results for input(s): DDIMER in the last 72 hours.  Cardiac Enzymes No results for input(s): CKMB, TROPONINI, MYOGLOBIN in the last 168 hours.  Invalid input(s): CK ------------------------------------------------------------------------------------------------------------------ Invalid input(s): [place]  Patient admitted with sepsis   1.  Sepsis,  with MSSA with left great toe osteomyelitis.    Continue IV cefazolin, seen by ID Dr. Steva Ready recommends removal of dialysis catheter, echocardiogram, possible TEE, discussed with Dr. Candiss Norse regarding possible removal of dialysis catheter and has temporary dialysis access, continue cefazolin for now   Acute ischemia of the left foot status post angioplasty of left popliteal by vascular , continue heparin drip, continue antibiotics for left great toe osteomyelitis, patient still has gangrenous changes in the left great toe, seen by podiatry, likely surgery towards the end of the week for left great toe.  With possible left great toe MTP joint surgery.  Continue to allow demarcation.  Patient perfusion is much better, continue to monitor.  Does have some blister on the dorsum of the left foot looks like a blood blister.  WBC slightly up from 16.5-18 today.  2.    Acute  encephalopathy due to #1 now improved, overnight events reviewed. .  3.  PAD.,  Left leg ischemia, status post hospitalization, continue aspirin, heparin drip, statins  vascular surgery recommends Eliquis.  We will hold off on Eliquis until dialysis catheter removal and also possible to amputation. 4.  End-stage renal disease on peritoneal dialysis.    Nephrology has been  notified, patient does not want peritoneal dialysis anymore, continue hemodialysis through right IJ permacath Monday, Wednesday, Friday.  5.  Diabetes type 2.  controlled, continue insulin with sliding scale coverage.  During the hospital stay.  Continue carb controlled renal diet. 6. CHF, currently clinically compensated, continue maintenance therapy.  Continue Coreg, Imdur, midodrine. 7.  Peripheral neuropathy, continue pain medication. #8. nausea likely secondary to sepsis/narcotic induced: And possible diabetic gastroparesis: N nausea again today, continue Reglan, PPI  #.9. hyperkalemia improved,    Code Status Orders  (From admission, onward)         Start     Ordered   01/29/18 2250  Full code  Continuous     01/29/18 2250        Code Status History    Date Active Date Inactive Code Status Order ID Comments User Context   10/24/2016 1055 10/25/2016 2243 Full Code [id number]  Hillary Bow, MD ED   10/17/2016 2143 10/19/2016 1440 Full Code [id number]  Vaughan Basta, MD Inpatient   09/04/2016 1708 09/05/2016 1500 Full Code [id number]  Epifanio Lesches, MD ED   08/22/2016 0938 08/22/2016 1833 Full Code [id number]  Algernon Huxley, MD Inpatient   06/27/2016 1121 06/27/2016 1846 Full Code [id number]  Algernon Huxley, MD Inpatient   10/03/2015 2235 10/04/2015 1724 Full Code [id number]  Harrie Foreman, MD Inpatient           Consults  NEPHROLOGY AND VASCULAR SURGERY   HEPRIN  Lab Results  Component Value Date   PLT 173 02/04/2018    Time Spent in minutes 38MIN   Greater than 50% of time spent in care coordination and counseling patient regarding the condition and plan of care. disCussed with patient's daughters today.  Epifanio Lesches M.D on 02/04/2018 at 1:51 PM  Between 7am to 6pm - Pager - [phone]  After 6pm go to [email or web address] - Proofreader  [employer]   Office  [phone]

## 2018-02-04 NOTE — Progress Notes (Signed)
Post HD assessment. Pt tolerated tx well without c/o or complication. Net UF 1504, goal met.    02/04/18 1248  Vital Signs  Temp 97.9 F (36.6 C)  Temp Source Oral  Pulse Rate 72  Pulse Rate Source Monitor  Resp 12  BP (!) 123/47  BP Location Right Arm  BP Method Automatic  Patient Position (if appropriate) Lying  Oxygen Therapy  SpO2 96 %  O2 Device Room Air  Dialysis Weight  Weight 56.6 kg  Type of Weight Post-Dialysis  Post-Hemodialysis Assessment  Rinseback Volume (mL) 250 mL  KECN 76.8 V  Dialyzer Clearance Lightly streaked  Duration of HD Treatment -hour(s) 3.5 hour(s)  Hemodialysis Intake (mL) 700 mL  UF Total -Machine (mL) 2204 mL  Net UF (mL) 1504 mL  Tolerated HD Treatment Yes  Education / Care Plan  Dialysis Education Provided Yes  Documented Education in Care Plan Yes  Hemodialysis Catheter Right Subclavian  No Placement Date or Time found.   Placed prior to admission: Yes  Orientation: Right  Access Location: Subclavian  Site Condition No complications  Blue Lumen Status Heparin locked  Red Lumen Status Heparin locked  Purple Lumen Status N/A  Catheter fill solution Heparin 1000 units/ml  Catheter fill volume (Arterial) 1.8 cc  Catheter fill volume (Venous) 1.9  Dressing Type Biopatch  Dressing Status Clean;Dry;Dressing reinforced  Interventions Dressing reinforced  Drainage Description None  Post treatment catheter status Capped and Clamped

## 2018-02-04 NOTE — Clinical Social Work Note (Signed)
CSW contacted patient's daughter, [name], and extended bed offers. She is going to speak with her mother and then let CSW know their decision. Shela Leff MSW,LCSW [phone]

## 2018-02-05 ENCOUNTER — Encounter: Payer: Self-pay | Admitting: Vascular Surgery

## 2018-02-05 DIAGNOSIS — I739 Peripheral vascular disease, unspecified: Secondary | ICD-10-CM

## 2018-02-05 DIAGNOSIS — N186 End stage renal disease: Secondary | ICD-10-CM

## 2018-02-05 DIAGNOSIS — M869 Osteomyelitis, unspecified: Secondary | ICD-10-CM

## 2018-02-05 DIAGNOSIS — E1169 Type 2 diabetes mellitus with other specified complication: Secondary | ICD-10-CM

## 2018-02-05 DIAGNOSIS — M79605 Pain in left leg: Secondary | ICD-10-CM

## 2018-02-05 DIAGNOSIS — A419 Sepsis, unspecified organism: Secondary | ICD-10-CM

## 2018-02-05 DIAGNOSIS — I330 Acute and subacute infective endocarditis: Secondary | ICD-10-CM

## 2018-02-05 DIAGNOSIS — L97529 Non-pressure chronic ulcer of other part of left foot with unspecified severity: Secondary | ICD-10-CM

## 2018-02-05 DIAGNOSIS — R7881 Bacteremia: Secondary | ICD-10-CM

## 2018-02-05 DIAGNOSIS — M79604 Pain in right leg: Secondary | ICD-10-CM

## 2018-02-05 LAB — GLUCOSE, CAPILLARY
Glucose-Capillary: 148 mg/dL — ABNORMAL HIGH (ref 70–99)
Glucose-Capillary: 144 mg/dL — ABNORMAL HIGH (ref 70–99)
Glucose-Capillary: 110 mg/dL — ABNORMAL HIGH (ref 70–99)
Glucose-Capillary: 184 mg/dL — ABNORMAL HIGH (ref 70–99)
Glucose-Capillary: 191 mg/dL — ABNORMAL HIGH (ref 70–99)

## 2018-02-05 LAB — CBC
HCT: 30.8 % — ABNORMAL LOW (ref 36.0–46.0)
Hemoglobin: 9.2 g/dL — ABNORMAL LOW (ref 12.0–15.0)
MCH: 29.8 pg (ref 26.0–34.0)
MCHC: 29.9 g/dL — ABNORMAL LOW (ref 30.0–36.0)
MCV: 99.7 fL (ref 80.0–100.0)
Platelets: 197 10*3/uL (ref 150–400)
RBC: 3.09 MIL/uL — ABNORMAL LOW (ref 3.87–5.11)
RDW: 19.9 % — ABNORMAL HIGH (ref 11.5–15.5)
WBC: 21 10*3/uL — ABNORMAL HIGH (ref 4.0–10.5)
nRBC: 0 % (ref 0.0–0.2)

## 2018-02-05 LAB — ECHOCARDIOGRAM COMPLETE
Height: 64 in
Weight: 1996.49 oz

## 2018-02-05 LAB — HEPARIN LEVEL (UNFRACTIONATED)
Heparin Unfractionated: 0.49 IU/mL (ref 0.30–0.70)
Heparin Unfractionated: 0.56 IU/mL (ref 0.30–0.70)

## 2018-02-05 NOTE — Progress Notes (Signed)
Daily Progress Note   Subjective  - 1 Day Post-Op  F/u gangrene left great toe.  Recent + blood cultures   Objective Vitals:   02/05/18 0500 02/05/18 0533 02/05/18 0751 02/05/18 1217  BP:  (!) 125/55 (!) 137/50 (!) 96/48  Pulse:  79 76 68  Resp:  20 17 17   Temp:  98.2 F (36.8 C) 98.6 F (37 C) 98.7 F (37.1 C)  TempSrc:  Oral Oral Oral  SpO2:  90% 93% 94%  Weight: 55.2 kg     Height:        Physical Exam: Noted gangrenous changes to left great toe. Superficial blistering on dorsal foot and anterior lower leg.  Deroofed small area and scant purulence noted.  Laboratory CBC    Component Value Date/Time   WBC 21.0 (H) 02/05/2018 0413   HGB 9.2 (L) 02/05/2018 0413   HCT 30.8 (L) 02/05/2018 0413   PLT 197 02/05/2018 0413    BMET    Component Value Date/Time   NA 135 02/04/2018 0926   K 4.5 02/04/2018 0926   CL 97 (L) 02/04/2018 0926   CO2 26 02/04/2018 0926   GLUCOSE 131 (H) 02/04/2018 0926   BUN 50 (H) 02/04/2018 0926   CREATININE 9.10 (H) 02/04/2018 0926   CALCIUM 8.3 (L) 02/04/2018 0926   GFRNONAA 4 (L) 02/04/2018 0926   GFRAA 5 (L) 02/04/2018 0926    Assessment/Planning: Gangrene left great toe   Will plan for amputation great toe in am  All r/b/a/c d/w pt and family.  NPO after midnight.    Samara Deist A  02/05/2018, 12:49 PM

## 2018-02-05 NOTE — Progress Notes (Signed)
ANTICOAGULATION CONSULT NOTE - Follow Up Consult  Pharmacy Consult for Heparin Indication: arterial embolism  Allergies  Allergen Reactions  . Chlorthalidone Anaphylaxis, Itching and Rash  . Fentanyl Rash  . Midazolam Rash  . Ace Inhibitors Other (See Comments)    Reaction:  Hyperkalemia, agitation   . Angiotensin Receptor Blockers Other (See Comments)    Reaction:  Hyperkalemia, agitation   . Norvasc [Amlodipine] Itching and Rash  . Phenergan [Promethazine Hcl] Anxiety    "antsy, can't sit still"    Patient Measurements: Height: 5' 4"  (162.6 cm) Weight: 124 lb 12.5 oz (56.6 kg) IBW/kg (Calculated) : 54.7 Heparin Dosing Weight: 56.7 kg  Vital Signs: Temp: 98.2 F (36.8 C) (10/17 0533) Temp Source: Oral (10/17 0533) BP: 125/55 (10/17 0533) Pulse Rate: 79 (10/17 0533)  Labs: Recent Labs    02/03/18 0524  02/04/18 0107 02/04/18 0926 02/05/18 0413  HGB 8.7*  --  8.3*  --  9.2*  HCT 28.8*  --  28.0*  --  30.8*  PLT 159  --  173  --  197  HEPARINUNFRC  --    < > 0.54 1.08* 0.49  CREATININE  --   --   --  9.10*  --    < > = values in this interval not displayed.    Estimated Creatinine Clearance: 5.3 mL/min (A) (by C-G formula based on SCr of 9.1 mg/dL (H)).  Assessment: 64 yof now s/p peripheral vascular catheterization with arterial embolism. Pharmacy consulted to dose heparin. No initial bolus due to receiving heparin 5000 units IV x 1 in angio suite per Dr. Delana Meyer. Hemoglobin is trending down following limb salvage angioplasty 10/11 but stabilizing.Epo ordered with HD  Heparin was started at 800 units/hr 10/12 0351 HL 0.47:  10/12 1307 HL 0.46 10/13 0324 HL 0.30: rate increased to 850 units/hr 10/13 1200 HL 0.52 10/13 1957 HL 0.60 10/14 0410 HL 0.59 10/15 AM    HL 0.85: decrease rate to 750 units/hr 10/15 1516 HL 0.81: decrease rate to 650 units/hr 10/16 0100 HL 0.54 10/16 0926 HL 1.01: question validity of value, order placed for redraw but patient  refused and then went to vascular lab  Goal of Therapy:  Heparin level 0.3-0.7 units/ml Monitor platelets by anticoagulation protocol: Yes   Plan:  After vascular procedure MD requested that Heparin be resumed at 19:00 at previous rate of 650 units/hr without a bolus. Will check HL 8 hours after restart.  10/17 AM heparin level 0.49. Recheck in 8 hours to confirm.  Sim Boast, PharmD, BCPS  02/05/18 5:34 AM

## 2018-02-05 NOTE — Progress Notes (Signed)
Stephanie Ellis                                                                                                                                                                                  Patient Demographics   Stephanie Ellis, is a 67 y.o. female, DOB - [birth year], KDT:[id number]  Admit date - 01/29/2018   Admitting Physician Amelia Jo, MD  Outpatient Primary MD for the patient is System, Pcp Not In   LOS - 7  Subjective: No more nausea, status post permacath removal by vascular yesterday, issues with bleeding so pressure applied to that area.  No fever or abdominal pain. Review of Systems:   CONSTITUTIONAL: No documented fever. No fatigue, weakness. No weight gain, no weight loss.  EYES: No blurry or double vision.  ENT: No tinnitus. No postnasal drip. No redness of the oropharynx.  RESPIRATORY: No cough, no wheeze, no hemoptysis. No dyspnea.  CARDIOVASCULAR: No chest pain. No orthopnea. No palpitations. No syncope.  GASTROINTESTINAL: No nausea or abdominal pain today.   GENITOURINARY: No dysuria or hematuria.  ENDOCRINE: No polyuria or nocturia. No heat or cold intolerance.  HEMATOLOGY: No anemia. No bruising. No bleeding.  INTEGUMENTARY: No rashes. No lesions.  MUSCULOSKELETAL: Left great toe is black.  Some bullae noted on the left dorsum of the left foot. NEUROLOGIC: No numbness, tingling, or ataxia. No seizure-type activity.  PSYCHIATRIC: No anxiety. No insomnia. No ADD.  Skin: Permacath removal from right anterior chest.   Vitals:   Vitals:   02/05/18 0500 02/05/18 0533 02/05/18 0751 02/05/18 1217  BP:  (!) 125/55 (!) 137/50 (!) 96/48  Pulse:  79 76 68  Resp:  20 17 17   Temp:  98.2 F (36.8 C) 98.6 F (37 C) 98.7 F (37.1 C)  TempSrc:  Oral Oral Oral  SpO2:  90% 93% 94%  Weight: 55.2 kg     Height:        Wt Readings from Last 3 Encounters:  02/05/18 55.2 kg  10/15/17 58.1 kg  10/09/17 58.1 kg     Intake/Output  Summary (Last 24 hours) at 02/05/2018 1251 Last data filed at 02/05/2018 0500 Gross per 24 hour  Intake 148.1 ml  Output -  Net 148.1 ml    Physical Exam:   GENERAL: Pleasant-appearing in no apparent distress.  HEAD, EYES, EARS, NOSE AND THROAT: Atraumatic, normocephalic. Extraocular muscles are intact. Pupils equal and reactive to light. Sclerae anicteric. No conjunctival injection. No oro-pharyngeal erythema.  NECK: Supple. There is no jugular venous distention. No bruits, no lymphadenopathy, no thyromegaly.  HEART: Regular rate and rhythm,. No murmurs, no rubs, no clicks.  LUNGS: Clear  to auscultation bilaterally. No rales or rhonchi. No wheezes.  ABDOMEN: Soft, flat, nontender, nondistended. Has good bowel sounds. No hepatosplenomegaly appreciated.  EXTREMITIES: Left great toe with ulcer, still has cyanosis of the left great toe.  Bullae noted on the dorsum of the left foot.  Left foot warm to touch.  Patient has 1+ palpable dorsalis pedis pulse on the left foot. NEUROLOGIC: The patient is alert, awake, and oriented x3 with no focal motor or sensory deficits appreciated bilaterally.  SKIN: Moist and warm with no rashes appreciated.  Psych: Not anxious, depressed LN: No inguinal LN enlargement    Antibiotics   Anti-infectives (From admission, onward)   Start     Dose/Rate Route Frequency Ordered Stop   01/31/18 2200  ceFAZolin (ANCEF) IVPB 1 g/50 mL premix     1 g 100 mL/hr over 30 Minutes Intravenous Every 24 hours 01/31/18 1409     01/30/18 1800  vancomycin (VANCOCIN) IVPB 1000 mg/200 mL premix  Status:  Discontinued     1,000 mg 200 mL/hr over 60 Minutes Intravenous Every M-W-F (Hemodialysis) 01/30/18 0435 01/30/18 0906   01/30/18 1000  piperacillin-tazobactam (ZOSYN) IVPB 3.375 g  Status:  Discontinued     3.375 g 12.5 mL/hr over 240 Minutes Intravenous Every 12 hours 01/30/18 0435 01/30/18 0906   01/30/18 1000  ceFAZolin (ANCEF) 500 mg in dextrose 5 % 100 mL IVPB  Status:   Discontinued     500 mg 210 mL/hr over 30 Minutes Intravenous Every 12 hours 01/30/18 0906 01/31/18 1409   01/29/18 2200  piperacillin-tazobactam (ZOSYN) IVPB 3.375 g     3.375 g 100 mL/hr over 30 Minutes Intravenous  Once 01/29/18 2146 01/29/18 2242   01/29/18 2200  vancomycin (VANCOCIN) IVPB 1000 mg/200 mL premix     1,000 mg 200 mL/hr over 60 Minutes Intravenous  Once 01/29/18 2146 01/29/18 2314      Medications   Scheduled Meds: . acetaminophen  650 mg Oral Q8H  . aspirin EC  81 mg Oral Daily  . atorvastatin  80 mg Oral QHS  . Atropine Sulfate-NaCl  1 drop Ophthalmic BID  . carvedilol  3.125 mg Oral BID WC  . Chlorhexidine Gluconate Cloth  6 each Topical Q0600  . cholecalciferol  400 Units Oral Once per day on Mon Wed Fri  . docusate sodium  100 mg Oral BID  . epoetin (EPOGEN/PROCRIT) injection  4,000 Units Intravenous Q M,W,F-HD  . famotidine  20 mg Oral Daily  . feeding supplement (NEPRO CARB STEADY)  237 mL Oral BID BM  . insulin aspart  0-5 Units Subcutaneous QHS  . insulin aspart  0-9 Units Subcutaneous TID WC  . irbesartan  75 mg Oral Daily  . isosorbide mononitrate  30 mg Oral Daily  . multivitamin  1 tablet Oral QHS  . multivitamin with minerals  1 tablet Oral Daily  . multivitamin-lutein  1 capsule Oral Daily   Continuous Infusions: .  ceFAZolin (ANCEF) IV Stopped (02/04/18 2207)  . heparin 650 Units/hr (02/05/18 0500)   PRN Meds:.acetaminophen **OR** acetaminophen, bisacodyl, HYDROmorphone (DILAUDID) injection, metoCLOPramide (REGLAN) injection, nitroGLYCERIN, ondansetron **OR** ondansetron (ZOFRAN) IV, oxyCODONE, traZODone   Data Review:   Micro Results Recent Results (from the past 240 hour(s))  Blood Culture (routine x 2)     Status: Abnormal   Collection Time: 01/29/18  9:55 PM  Result Value Ref Range Status   Specimen Description   Final    BLOOD RIGHT ANTECUBITAL Performed at [facility], [address] [place], [place] [address]     Special Requests   Final    BOTTLES DRAWN AEROBIC AND ANAEROBIC Blood Culture adequate volume Performed at [facility], [address] [place], [place] [address]    Culture  Setup Time   Final    GRAM POSITIVE COCCI IN CLUSTERS IN BOTH AEROBIC AND ANAEROBIC BOTTLES CRITICAL RESULT CALLED TO, READ BACK BY AND VERIFIED WITH: KAREN HAYES 01/30/18 0839 JGF Performed at [facility], [address] [place], [place] [address]    Culture (A)  Final    STAPHYLOCOCCUS AUREUS SUSCEPTIBILITIES PERFORMED ON PREVIOUS CULTURE WITHIN THE LAST 5 DAYS. Performed at [facility], [place] [address] [place], [place] [address]    Report Status 02/01/2018 FINAL  Final  Blood Culture (routine x 2)     Status: Abnormal   Collection Time: 01/29/18  9:55 PM  Result Value Ref Range Status   Specimen Description   Final    BLOOD RIGHT ANTECUBITAL Performed at [facility], [address] [place], [place] [address]    Special Requests   Final    BOTTLES DRAWN AEROBIC AND ANAEROBIC Blood Culture adequate volume Performed at [facility], [address] [place], [place] [address]    Culture  Setup Time   Final    GRAM POSITIVE COCCI IN CLUSTERS IN BOTH AEROBIC AND ANAEROBIC BOTTLES CRITICAL RESULT CALLED TO, READ BACK BY AND VERIFIED WITH: KAREN HAYES 01/30/18 0839 JGF Performed at [facility], [place] [address] [place], [place] [address]    Culture STAPHYLOCOCCUS AUREUS (A)  Final   Report Status 02/01/2018 FINAL  Final   Organism ID, Bacteria STAPHYLOCOCCUS AUREUS  Final      Susceptibility   Staphylococcus aureus - MIC*    CIPROFLOXACIN <=0.5 SENSITIVE Sensitive     ERYTHROMYCIN <=0.25 SENSITIVE Sensitive     GENTAMICIN <=0.5 SENSITIVE Sensitive     OXACILLIN 0.5 SENSITIVE Sensitive     TETRACYCLINE <=1 SENSITIVE Sensitive     VANCOMYCIN <=0.5 SENSITIVE Sensitive     TRIMETH/SULFA <=10 SENSITIVE Sensitive     CLINDAMYCIN <=0.25 SENSITIVE  Sensitive     RIFAMPIN <=0.5 SENSITIVE Sensitive     Inducible Clindamycin NEGATIVE Sensitive     * STAPHYLOCOCCUS AUREUS  Blood Culture ID Panel (Reflexed)     Status: Abnormal   Collection Time: 01/29/18  9:55 PM  Result Value Ref Range Status   Enterococcus species NOT DETECTED NOT DETECTED Final   Vancomycin resistance NOT DETECTED NOT DETECTED Final   Listeria monocytogenes NOT DETECTED NOT DETECTED Final   Staphylococcus species DETECTED (A) NOT DETECTED Final    Comment: CRITICAL RESULT CALLED TO, READ BACK BY AND VERIFIED WITH: KAREN HAYES 01/30/18 0839 JGF    Staphylococcus aureus (BCID) DETECTED (A) NOT DETECTED Final    Comment: CRITICAL RESULT CALLED TO, READ BACK BY AND VERIFIED WITH: KAREN HAYES 01/30/18 0839 JGF Methicillin (oxacillin) susceptible Staphylococcus aureus (MSSA). Preferred therapy is anti staphylococcal beta lactam antibiotic (Cefazolin or Nafcillin), unless clinically contraindicated.    Methicillin resistance NOT DETECTED NOT DETECTED Final   Streptococcus species NOT DETECTED NOT DETECTED Final   Streptococcus agalactiae NOT DETECTED NOT DETECTED Final   Streptococcus pneumoniae NOT DETECTED NOT DETECTED Final   Streptococcus pyogenes NOT DETECTED NOT DETECTED Final   Acinetobacter baumannii NOT DETECTED NOT DETECTED Final   Enterobacteriaceae species NOT DETECTED NOT DETECTED Final   Enterobacter cloacae complex NOT DETECTED NOT DETECTED Final  Escherichia coli NOT DETECTED NOT DETECTED Final   Klebsiella oxytoca NOT DETECTED NOT DETECTED Final   Klebsiella pneumoniae NOT DETECTED NOT DETECTED Final   Proteus species NOT DETECTED NOT DETECTED Final   Serratia marcescens NOT DETECTED NOT DETECTED Final   Carbapenem resistance NOT DETECTED NOT DETECTED Final   Haemophilus influenzae NOT DETECTED NOT DETECTED Final   Neisseria meningitidis NOT DETECTED NOT DETECTED Final   Pseudomonas aeruginosa NOT DETECTED NOT DETECTED Final   Candida albicans NOT  DETECTED NOT DETECTED Final   Candida glabrata NOT DETECTED NOT DETECTED Final   Candida krusei NOT DETECTED NOT DETECTED Final   Candida parapsilosis NOT DETECTED NOT DETECTED Final   Candida tropicalis NOT DETECTED NOT DETECTED Final    Comment: Performed at [facility], [address] [place], [place] [address]  MRSA PCR Screening     Status: None   Collection Time: 01/29/18 10:55 PM  Result Value Ref Range Status   MRSA by PCR NEGATIVE NEGATIVE Final    Comment:        The GeneXpert MRSA Assay (FDA approved for NASAL specimens only), is one component of a comprehensive MRSA colonization surveillance program. It is not intended to diagnose MRSA infection nor to guide or monitor treatment for MRSA infections. Performed at [facility], [address] [place], [place] [address]   Culture, blood (Routine X 2) w Reflex to ID Panel     Status: None   Collection Time: 01/30/18  9:27 AM  Result Value Ref Range Status   Specimen Description BLOOD RIGHT ANTECUBITAL  Final   Special Requests   Final    BOTTLES DRAWN AEROBIC AND ANAEROBIC Blood Culture adequate volume   Culture   Final    NO GROWTH 5 DAYS Performed at [facility], [address], [place], [place] [address]    Report Status 02/04/2018 FINAL  Final    Radiology Reports Dg Tibia/fibula Left  Result Date: 01/29/2018 CLINICAL DATA:  Presents with ED via ems from home Having pain to both lower legs But states left leg is worse Increased pain with palpation Decreased pulses to foot EXAM: LEFT TIBIA AND FIBULA - 2 VIEW COMPARISON:  None. FINDINGS: No fracture.  No bone lesion. Knee and ankle joints are normally aligned. There are arterial vascular calcifications. Soft tissues are otherwise unremarkable. IMPRESSION: Negative Electronically Signed   By: Lajean Manes M.D.   On: 01/29/2018 20:58   US Venous Img Lower Unilateral Left  Result Date: 01/29/2018 CLINICAL DATA:  Left leg pain  EXAM: LEFT LOWER EXTREMITY VENOUS DOPPLER ULTRASOUND TECHNIQUE: Gray-scale sonography with graded compression, as well as color Doppler and duplex ultrasound were performed to evaluate the lower extremity deep venous systems from the level of the Ellis femoral vein and including the Ellis femoral, femoral, profunda femoral, popliteal and calf veins including the posterior tibial, peroneal and gastrocnemius veins when visible. The superficial great saphenous vein was also interrogated. Spectral Doppler was utilized to evaluate flow at rest and with distal augmentation maneuvers in the Ellis femoral, femoral and popliteal veins. COMPARISON:  None. FINDINGS: Contralateral Ellis Femoral Vein: Respiratory phasicity is normal and symmetric with the symptomatic side. No evidence of thrombus. Normal compressibility. Ellis Femoral Vein: No evidence of thrombus. Normal compressibility, respiratory phasicity and response to augmentation. Saphenofemoral Junction: No evidence of thrombus. Normal compressibility and flow on color Doppler imaging. Profunda Femoral Vein: No evidence of thrombus. Normal compressibility and flow on color Doppler imaging. Femoral Vein: No  evidence of thrombus. Normal compressibility, respiratory phasicity and response to augmentation. Popliteal Vein: No evidence of thrombus. Normal compressibility, respiratory phasicity and response to augmentation. Calf Veins: No evidence of thrombus. Normal compressibility and flow on color Doppler imaging. Superficial Great Saphenous Vein: No evidence of thrombus. Normal compressibility. Venous Reflux:  None. Other Findings:  None. IMPRESSION: No evidence of deep venous thrombosis. Electronically Signed   By: Rolm Baptise M.D.   On: 01/29/2018 18:36   Dg Toe Great Left  Result Date: 01/29/2018 CLINICAL DATA:  Open wound to the left great toe for several months. EXAM: LEFT GREAT TOE COMPARISON:  10/07/2017 FINDINGS: Degenerative changes in the first  metatarsal-phalangeal joint. Soft tissue defect over the plantar aspect of the left first toe. There is underlying erosion of the distal phalangeal tuft cortex suggesting focal osteomyelitis. No evidence of acute fracture or dislocation. Diffuse bone demineralization. Prominent vascular calcifications. IMPRESSION: Soft tissue defect over the plantar aspect of the left first toe with underlying erosion of the distal phalangeal tuft suggesting osteomyelitis. Electronically Signed   By: Lucienne Capers M.D.   On: 01/29/2018 21:30     CBC Recent Labs  Lab 02/01/18 0324 02/02/18 0410 02/03/18 0524 02/04/18 0107 02/05/18 0413  WBC 18.5* 19.9* 16.5* 18.0* 21.0*  HGB 10.3* 9.0* 8.7* 8.3* 9.2*  HCT 34.0* 29.3* 28.8* 28.0* 30.8*  PLT 167 170 159 173 197  MCV 98.0 97.3 99.0 99.3 99.7  MCH 29.7 29.9 29.9 29.4 29.8  MCHC 30.3 30.7 30.2 29.6* 29.9*  RDW 18.7* 19.3* 19.7* 19.7* 19.9*    Chemistries  Recent Labs  Lab 01/29/18 1722 01/30/18 0627 01/30/18 1345 02/04/18 0926  NA 135 135 139 135  K 4.9 6.1* 3.2* 4.5  CL 91* 93* 98 97*  CO2 26 25 27 26   GLUCOSE 216* 195* 122* 131*  BUN 93* 97* 35* 50*  CREATININE 15.41* 16.71* 6.72* 9.10*  CALCIUM 8.6* 8.2* 8.2* 8.3*   ------------------------------------------------------------------------------------------------------------------ estimated creatinine clearance is 5.3 mL/min (A) (by C-G formula based on SCr of 9.1 mg/dL (H)). ------------------------------------------------------------------------------------------------------------------ No results for input(s): HGBA1C in the last 72 hours. ------------------------------------------------------------------------------------------------------------------ No results for input(s): CHOL, HDL, LDLCALC, TRIG, CHOLHDL, LDLDIRECT in the last 72 hours. ------------------------------------------------------------------------------------------------------------------ No results for input(s): TSH,  T4TOTAL, T3FREE, THYROIDAB in the last 72 hours.  Invalid input(s): FREET3 ------------------------------------------------------------------------------------------------------------------ No results for input(s): VITAMINB12, FOLATE, FERRITIN, TIBC, IRON, RETICCTPCT in the last 72 hours.  Coagulation profile Recent Labs  Lab 01/29/18 2136  INR 1.14    No results for input(s): DDIMER in the last 72 hours.  Cardiac Enzymes No results for input(s): CKMB, TROPONINI, MYOGLOBIN in the last 168 hours.  Invalid input(s): CK ------------------------------------------------------------------------------------------------------------------ Invalid input(s): [place]  Patient admitted with sepsis   1.  Sepsis,  with MSSA with left great toe osteomyelitis.    Continue IV cefazolin, seen by ID Dr. Steva Ready recommends removal of dialysis catheter, dialysis catheter removed by vascular yesterday.  Cardiogram showed EF 50 to 55%, regional wall motion abnormalities cannot be excluded, possible mobile echodensity and tricuspid valve concerning for vegetation, patient TEE.Marland Kitchen  Spoke with [facility] yesterday.    Acute ischemia of the left foot status post angioplasty of left popliteal by vascular , continue heparin drip, continue antibiotics for left great toe osteomyelitis, seen by vascular,, patient has gangrenous changes of the left great toe, superficial blistering of dorsal foot and anterior lower leg, Deroo finger small area and scant purulence obtained today, patient to  have amputation of great toe tomorrow, continue heparin drip, IV antibiotics, n.p.o. after midnight  2.    Acute encephalopathy due to #1 now improved, overnight events reviewed. .   3.  PAD.,  Left leg ischemia, status post hospitalization, continue aspirin, heparin drip, statins  vascular surgery recommends Eliquis.  Continue heparin drip  4.  End-stage renal disease on peritoneal dialysis.     Status post permacath removal yesterday because of MSSA bacteremia, he needs temporary dialysis catheter for her hemodialysis.    5.  Diabetes type 2.  controlled, continue insulin with sliding scale coverage.  During the Ellis stay.  Continue carb controlled renal diet.  Continue sliding scale insulin with coverage.  Patient p.o. intake is still unpredictable  6. CHF, currently clinically compensated, continue maintenance therapy.  Continue Coreg, Imdur, midodrine . 7.  Peripheral neuropathy, continue pain medication. #8. nausea likely secondary to sepsis/narcotic induced: And possible diabetic gastroparesis: N nausea again today, continue Reglan, PPI    #.9. hyperkalemia improved,  Plan discussed with patient's daughter at bedside.  Wants to talk to case manager regarding her Medicaid application    Code Status Orders  (From admission, onward)         Start     Ordered   01/29/18 2250  Full code  Continuous     01/29/18 2250        Code Status History    Date Active Date Inactive Code Status Order ID Comments User Context   10/24/2016 1055 10/25/2016 2243 Full Code [id number]  Hillary Bow, MD ED   10/17/2016 2143 10/19/2016 1440 Full Code [id number]  Vaughan Basta, MD Inpatient   09/04/2016 1708 09/05/2016 1500 Full Code [id number]  Epifanio Lesches, MD ED   08/22/2016 0938 08/22/2016 1833 Full Code [id number]  Algernon Huxley, MD Inpatient   06/27/2016 1121 06/27/2016 1846 Full Code [id number]  Algernon Huxley, MD Inpatient   10/03/2015 2235 10/04/2015 1724 Full Code [id number]  Harrie Foreman, MD Inpatient           Consults  NEPHROLOGY AND VASCULAR SURGERY   HEPRIN  Lab Results  Component Value Date   PLT 197 02/05/2018    Time Spent in minutes 38MIN   Greater than 50% of time spent in care coordination and counseling patient regarding the condition and plan of care. disCussed with patient's daughters today.  Epifanio Lesches M.D on 02/05/2018 at 12:51  PM  Between 7am to 6pm - Pager - [phone]  After 6pm go to [email or web address] - Proofreader  [employer]   Office  [phone]

## 2018-02-05 NOTE — Progress Notes (Signed)
Daily Progress Note   Patient Name: Stephanie Ellis       Date: 02/05/2018 DOB: 01-29-1951  Age: 67 y.o. MRN#: [id number] Attending Physician: Epifanio Lesches, MD Primary Care Physician: System, Pcp Not In Admit Date: 01/29/2018  Reason for Consultation/Follow-up: Establishing goals of care  Subjective: Met with patient and daughter this morning. Patient s/p having HD cath removed for possible source of infection. Patient states she is tired. She notes she is tired of being told what is best for her. We discussed that she does have agency and choice over her healthcare and procedures. Continued aggressive care vs comfort was discussed.  We discussed her poor appetite and fatigue. She isn't interested in taking more medications. States she just wants to be able to eat foods she enjoys- she was craving pineapple, but was told she could only have applesauce. Agrees to consult with dietician. Notes that her pain in her legs and feet are controlled. She is still desiring full scope care and full code status.   ROS  Length of Stay: 7  Current Medications: Scheduled Meds:  . acetaminophen  650 mg Oral Q8H  . aspirin EC  81 mg Oral Daily  . atorvastatin  80 mg Oral QHS  . Atropine Sulfate-NaCl  1 drop Ophthalmic BID  . carvedilol  3.125 mg Oral BID WC  . Chlorhexidine Gluconate Cloth  6 each Topical Q0600  . cholecalciferol  400 Units Oral Once per day on Mon Wed Fri  . docusate sodium  100 mg Oral BID  . epoetin (EPOGEN/PROCRIT) injection  4,000 Units Intravenous Q M,W,F-HD  . famotidine  20 mg Oral Daily  . feeding supplement (NEPRO CARB STEADY)  237 mL Oral BID BM  . insulin aspart  0-5 Units Subcutaneous QHS  . insulin aspart  0-9 Units Subcutaneous TID WC  . irbesartan  75 mg Oral  Daily  . isosorbide mononitrate  30 mg Oral Daily  . multivitamin  1 tablet Oral QHS  . multivitamin with minerals  1 tablet Oral Daily  . multivitamin-lutein  1 capsule Oral Daily    Continuous Infusions: .  ceFAZolin (ANCEF) IV Stopped (02/04/18 2207)  . heparin 650 Units/hr (02/05/18 0500)    PRN Meds: acetaminophen **OR** acetaminophen, bisacodyl, HYDROmorphone (DILAUDID) injection, metoCLOPramide (REGLAN) injection, nitroGLYCERIN, ondansetron **OR** ondansetron (ZOFRAN) IV, oxyCODONE,  traZODone  Physical Exam  Constitutional:  cachectic  Nursing note and vitals reviewed.           Vital Signs: BP (!) 96/48 (BP Location: Right Arm)   Pulse 68   Temp 98.7 F (37.1 C) (Oral)   Resp 17   Ht _0  (1.626 m)   Wt 55.2 kg   SpO2 94%   BMI 20.89 kg/m  SpO2: SpO2: 94 % O2 Device: O2 Device: Nasal Cannula O2 Flow Rate: O2 Flow Rate (L/min): 2 L/min  Intake/output summary:   Intake/Output Summary (Last 24 hours) at 02/05/2018 1327 Last data filed at 02/05/2018 0500 Gross per 24 hour  Intake 148.1 ml  Output -  Net 148.1 ml   LBM: Last BM Date: (per patient last BM was 10/10) Baseline Weight: Weight: 59 kg Most recent weight: Weight: 55.2 kg       Palliative Assessment/Data: PPS: 30%     Patient Active Problem List   Diagnosis Date Noted  . Diabetic osteomyelitis ([place])   . Peripheral vascular disease ([place])   . Advanced care planning/counseling discussion   . Palliative care by specialist   . Goals of care, counseling/discussion   . Diabetes mellitus, type 2 ([place]) 01/30/2018  . Sepsis ([place]) 01/29/2018  . Peripheral neuropathy 06/17/2017  . Encephalopathy 02/06/2017  . Hypertensive emergency 02/06/2017  . Endophthalmitis, left eye 12/05/2016  . Vomiting 10/24/2016  . Hematuria 10/17/2016  . Acute lower UTI 10/17/2016  . Anesthesia complication 93/90/3009  . Chest pain with moderate risk for cardiac etiology 09/04/2016  . Steal syndrome dialysis vascular  access, initial encounter ([place]) 06/18/2016  . 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 Coronary artery disease involving coronary bypass graft of native heart with angina pectoris ([place]) 02/10/2016  . Hypertension 02/02/2016  . Hyperlipidemia 02/02/2016  . Coronary artery disease involving left main coronary artery 10/04/2015  . Abdominal pain of unknown etiology 10/04/2015  . ESRD (end stage renal disease) on dialysis ([place])   . Type II diabetes mellitus with complication ([place])   . NSTEMI (non-ST elevated myocardial infarction) ([place])   . Right sided abdominal pain   . Elevated troponin 10/03/2015  . 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 Chronic diastolic congestive heart failure ([place]) 04/19/2014  . Stage 5 chronic kidney disease on chronic dialysis ([place]) 04/19/2014  . Anemia 09/20/2013  . Routine health maintenance 01/29/2013    Palliative Care Assessment & Plan   Patient Profile: 67 y.o.femalewith past medical history of ESRD on peritoneal dialysis at home, type 2 DM, HTN, CHF, CAD s/p CABGadmitted on 10/10/2019with bilateral leg pain. Workup revealed sepsis from osteomyelitis. During admission she was temporarily unresponsive and expressed desire for DNR status. Palliative medicine consulted for [place].  Assessment/Recommendations/Plan   PMT will shadow for now and intervene if patient shows failure to progress or declines.   Will place nutrition consult for possible diet recommendations- would recommend liberalizing patient's diet to allow for some increase in caloric intake- patient is not interested in appetite stimulating medications  Recommend outpatient Palliative continue to follow in discharge setting which at this point will likely be rehab facility  Goals of Care and Additional Recommendations:  Limitations on Scope of Treatment: Full Scope Treatment  Code Status:  Full code  Prognosis:   Unable to determine  Discharge Planning:  Independence for rehab with Palliative care service follow-up  Care plan was discussed with patient  Thank you for allowing the Palliative Medicine Team to assist  in the care of this patient.   Time In: 1000 Time Out: 1035 Total Time 35 mins Prolonged Time Billed no      Greater than 50%  of this time was spent counseling and coordinating care related to the above assessment and plan.  Mariana Kaufman, AGNP-C Palliative Medicine   Please contact Palliative Medicine Team phone at [phone] for questions and concerns.

## 2018-02-05 NOTE — Progress Notes (Signed)
Physical Therapy Treatment Patient Details Name: Stephanie Ellis MRN: [id number] DOB: 1951/01/22 Today's Date: 02/05/2018    History of Present Illness Patient is a 67 year old female admitted with sepsis and receiving a catheterization of L LE on 01/30/18.  PMH includes DMII, CABG x3, CAD, MI, ESRD, CAD, CHF, anorexia and anemia.    PT Comments    Of note, pt had R IJ dialysis cath removed secondary to bacteremia with a temp cath scheduled to be placed 10/18.  Pt also scheduled to have her L great toe amputated 10/18.  Pt presented with deficits in strength, transfers, mobility, gait, balance, and activity tolerance.  Pt was able to perform sup to/from sit without physical assistance but required extra time and effort.  Pt was able to perform dynamic sitting activities with reaching outside BOS and lateral scooting at the EOB but was unable to come to full standing secondary to increased nausea with that amount of effort.  Pt tolerated below therex well but required mod verbal and tactile cues to remain focused on task and for proper technique.  Pt will benefit from PT services in a SNF setting upon discharge to safely address above deficits for decreased caregiver assistance and eventual return to PLOF.     Follow Up Recommendations  SNF     Equipment Recommendations  None recommended by PT    Recommendations for Other Services       Precautions / Restrictions Precautions Precautions: Fall Restrictions Weight Bearing Restrictions: Yes LLE Weight Bearing: Weight bearing as tolerated    Mobility  Bed Mobility Overal bed mobility: Modified Independent Bed Mobility: Supine to Sit;Sit to Supine     Supine to sit: Modified independent (Device/Increase time) Sit to supine: Modified independent (Device/Increase time)   General bed mobility comments: Extra time and effort with bed mobility tasks but no physical assistance required  Transfers                 General  transfer comment: Pt able to scoot L/R at the EOB but unable to come to standing secondary to weakness and nausea  Ambulation/Gait             General Gait Details: Unable   Stairs             Wheelchair Mobility    Modified Rankin (Stroke Patients Only)       Balance                                            Cognition Arousal/Alertness: Lethargic Behavior During Therapy: Flat affect Overall Cognitive Status: Within Functional Limits for tasks assessed                                        Exercises Total Joint Exercises Ankle Circles/Pumps: AROM;Both;10 reps Quad Sets: Strengthening;Both;10 reps Gluteal Sets: Strengthening;Both;10 reps Hip ABduction/ADduction: AROM;AAROM;Both;10 reps Straight Leg Raises: AROM;AAROM;Both;10 reps Long Arc Quad: AROM;Both;10 reps Knee Flexion: AROM;Both;10 reps Other Exercises Other Exercises: Lateral scooting at the EOB L/R    General Comments        Pertinent Vitals/Pain Pain Assessment: No/denies pain    Home Living                      Prior Function  PT Goals (current goals can now be found in the care plan section) Progress towards PT goals: Not progressing toward goals - comment(Limited by weakness and nausea)    Frequency    Min 2X/week      PT Plan Current plan remains appropriate    Co-evaluation              AM-PAC PT "6 Clicks" Daily Activity  Outcome Measure                   End of Session Equipment Utilized During Treatment: Oxygen;Gait belt Activity Tolerance: Patient limited by fatigue;Other (comment)(Limited by nausea) Patient left: in bed;with bed alarm set;with call bell/phone within reach;with family/visitor present Nurse Communication: Mobility status PT Visit Diagnosis: Unsteadiness on feet (R26.81);Muscle weakness (generalized) (M62.81)     Time: 0347-4259 PT Time Calculation (min) (ACUTE ONLY): 25  min  Charges:  $Therapeutic Exercise: 8-22 mins $Therapeutic Activity: 8-22 mins                     D. Scott Harold Mattes PT, DPT 02/05/18, 5:15 PM

## 2018-02-05 NOTE — Progress Notes (Signed)
[facility]  ROUNDING NOTE   Subjective:   [name] admitted to [facility] on 01/29/2018 for Peripheral vascular disease [facility]) [I73.9] Right leg pain [M79.604] Left leg pain [M79.605] Ulcer of great toe, left, with unspecified severity ([place]) [L97.529]   PermCath was removed yesterday due to bacteremia Feels fair today.  No acute complaints   Objective:  Vital signs in last 24 hours:  Temp:  [98.1 F (36.7 C)-98.7 F (37.1 C)] 98.7 F (37.1 C) (10/17 1217) Pulse Rate:  [68-79] 68 (10/17 1217) Resp:  [15-20] 17 (10/17 1217) BP: (96-137)/(48-58) 96/48 (10/17 1217) SpO2:  [90 %-96 %] 94 % (10/17 1217) Weight:  [55.2 kg] 55.2 kg (10/17 0500)  Weight change: 0.656 kg Filed Weights   02/04/18 0854 02/04/18 1248 02/05/18 0500  Weight: 57.9 kg 56.6 kg 55.2 kg    Intake/Output: I/O last 3 completed shifts: In: 490.1 [P.O.:240; I.V.:149.6; IV Piggyback:100.5] Out: 1504 [KCLEX:5170]   Intake/Output this shift:  No intake/output data recorded.  Physical Exam: General: NAD, laying in bed, chronically ill-appearing  Head: Normocephalic, atraumatic. Moist oral mucosal membranes  Eyes: Anicteric   Neck: Supple,    Lungs:  Clear to auscultation  Heart: Regular rate and rhythm  Abdomen:  Soft, nontender,   Extremities:  left first toe with tenderness and ischemic changes, discoloration    Neurologic: Alert, oreinted  Access: PD catheter, RIJ permcath    Basic Metabolic Panel: Recent Labs  Lab 01/29/18 1722 01/30/18 0627 01/30/18 1345 02/02/18 0410 02/04/18 0926  NA 135 135 139  --  135  K 4.9 6.1* 3.2*  --  4.5  CL 91* 93* 98  --  97*  CO2 26 25 27   --  26  GLUCOSE 216* 195* 122*  --  131*  BUN 93* 97* 35*  --  50*  CREATININE 15.41* 16.71* 6.72*  --  9.10*  CALCIUM 8.6* 8.2* 8.2*  --  8.3*  PHOS  --   --   --  9.1* 4.3    Liver Function Tests: Recent Labs  Lab 02/04/18 0926  ALBUMIN 2.0*   No results for input(s): LIPASE, AMYLASE in the  last 168 hours. No results for input(s): AMMONIA in the last 168 hours.  CBC: Recent Labs  Lab 02/01/18 0324 02/02/18 0410 02/03/18 0524 02/04/18 0107 02/05/18 0413  WBC 18.5* 19.9* 16.5* 18.0* 21.0*  HGB 10.3* 9.0* 8.7* 8.3* 9.2*  HCT 34.0* 29.3* 28.8* 28.0* 30.8*  MCV 98.0 97.3 99.0 99.3 99.7  PLT 167 170 159 173 197    Cardiac Enzymes: No results for input(s): CKTOTAL, CKMB, CKMBINDEX, TROPONINI in the last 168 hours.  BNP: Invalid input(s): POCBNP  CBG: Recent Labs  Lab 02/04/18 1412 02/04/18 1713 02/04/18 2127 02/05/18 0734 02/05/18 1130  GLUCAP 145* 148* 172* 144* 110*    Microbiology: Results for orders placed or performed during the hospital encounter of 01/29/18  Blood Culture (routine x 2)     Status: Abnormal   Collection Time: 01/29/18  9:55 PM  Result Value Ref Range Status   Specimen Description   Final    BLOOD RIGHT ANTECUBITAL Performed at [facility], [address] [place], [place] [address]    Special Requests   Final    BOTTLES DRAWN AEROBIC AND ANAEROBIC Blood Culture adequate volume Performed at [facility], [address], [place], [place] [address]    Culture  Setup Time   Final    GRAM POSITIVE COCCI IN CLUSTERS IN BOTH  AEROBIC AND ANAEROBIC BOTTLES CRITICAL RESULT CALLED TO, READ BACK BY AND VERIFIED WITH: KAREN HAYES 01/30/18 0839 JGF Performed at [facility], [address] [place], [place] [address]    Culture (A)  Final    STAPHYLOCOCCUS AUREUS SUSCEPTIBILITIES PERFORMED ON PREVIOUS CULTURE WITHIN THE LAST 5 DAYS. Performed at [facility], [place] [address], [place], [place] [address]    Report Status 02/01/2018 FINAL  Final  Blood Culture (routine x 2)     Status: Abnormal   Collection Time: 01/29/18  9:55 PM  Result Value Ref Range Status   Specimen Description   Final    BLOOD RIGHT ANTECUBITAL Performed at [facility], [address], [place], [place] [address]     Special Requests   Final    BOTTLES DRAWN AEROBIC AND ANAEROBIC Blood Culture adequate volume Performed at [facility], [address] [place], [place] [address]    Culture  Setup Time   Final    GRAM POSITIVE COCCI IN CLUSTERS IN BOTH AEROBIC AND ANAEROBIC BOTTLES CRITICAL RESULT CALLED TO, READ BACK BY AND VERIFIED WITH: KAREN HAYES 01/30/18 0839 JGF Performed at [facility], [place] [address] [place], [place] [address]    Culture STAPHYLOCOCCUS AUREUS (A)  Final   Report Status 02/01/2018 FINAL  Final   Organism ID, Bacteria STAPHYLOCOCCUS AUREUS  Final      Susceptibility   Staphylococcus aureus - MIC*    CIPROFLOXACIN <=0.5 SENSITIVE Sensitive     ERYTHROMYCIN <=0.25 SENSITIVE Sensitive     GENTAMICIN <=0.5 SENSITIVE Sensitive     OXACILLIN 0.5 SENSITIVE Sensitive     TETRACYCLINE <=1 SENSITIVE Sensitive     VANCOMYCIN <=0.5 SENSITIVE Sensitive     TRIMETH/SULFA <=10 SENSITIVE Sensitive     CLINDAMYCIN <=0.25 SENSITIVE Sensitive     RIFAMPIN <=0.5 SENSITIVE Sensitive     Inducible Clindamycin NEGATIVE Sensitive     * STAPHYLOCOCCUS AUREUS  Blood Culture ID Panel (Reflexed)     Status: Abnormal   Collection Time: 01/29/18  9:55 PM  Result Value Ref Range Status   Enterococcus species NOT DETECTED NOT DETECTED Final   Vancomycin resistance NOT DETECTED NOT DETECTED Final   Listeria monocytogenes NOT DETECTED NOT DETECTED Final   Staphylococcus species DETECTED (A) NOT DETECTED Final    Comment: CRITICAL RESULT CALLED TO, READ BACK BY AND VERIFIED WITH: KAREN HAYES 01/30/18 0839 JGF    Staphylococcus aureus (BCID) DETECTED (A) NOT DETECTED Final    Comment: CRITICAL RESULT CALLED TO, READ BACK BY AND VERIFIED WITH: KAREN HAYES 01/30/18 0839 JGF Methicillin (oxacillin) susceptible Staphylococcus aureus (MSSA). Preferred therapy is anti staphylococcal beta lactam antibiotic (Cefazolin or Nafcillin), unless clinically contraindicated.    Methicillin resistance  NOT DETECTED NOT DETECTED Final   Streptococcus species NOT DETECTED NOT DETECTED Final   Streptococcus agalactiae NOT DETECTED NOT DETECTED Final   Streptococcus pneumoniae NOT DETECTED NOT DETECTED Final   Streptococcus pyogenes NOT DETECTED NOT DETECTED Final   Acinetobacter baumannii NOT DETECTED NOT DETECTED Final   Enterobacteriaceae species NOT DETECTED NOT DETECTED Final   Enterobacter cloacae complex NOT DETECTED NOT DETECTED Final   Escherichia coli NOT DETECTED NOT DETECTED Final   Klebsiella oxytoca NOT DETECTED NOT DETECTED Final   Klebsiella pneumoniae NOT DETECTED NOT DETECTED Final   Proteus species NOT DETECTED NOT DETECTED Final   Serratia marcescens NOT DETECTED NOT DETECTED Final   Carbapenem resistance NOT DETECTED NOT DETECTED Final   Haemophilus influenzae NOT  DETECTED NOT DETECTED Final   Neisseria meningitidis NOT DETECTED NOT DETECTED Final   Pseudomonas aeruginosa NOT DETECTED NOT DETECTED Final   Candida albicans NOT DETECTED NOT DETECTED Final   Candida glabrata NOT DETECTED NOT DETECTED Final   Candida krusei NOT DETECTED NOT DETECTED Final   Candida parapsilosis NOT DETECTED NOT DETECTED Final   Candida tropicalis NOT DETECTED NOT DETECTED Final    Comment: Performed at [facility], [address] [place], [place] [address]  MRSA PCR Screening     Status: None   Collection Time: 01/29/18 10:55 PM  Result Value Ref Range Status   MRSA by PCR NEGATIVE NEGATIVE Final    Comment:        The GeneXpert MRSA Assay (FDA approved for NASAL specimens only), is one component of a comprehensive MRSA colonization surveillance program. It is not intended to diagnose MRSA infection nor to guide or monitor treatment for MRSA infections. Performed at [facility], [address] [place], [place] [address]   Culture, blood (Routine X 2) w Reflex to ID Panel     Status: None   Collection Time: 01/30/18  9:27 AM  Result Value Ref Range Status    Specimen Description BLOOD RIGHT ANTECUBITAL  Final   Special Requests   Final    BOTTLES DRAWN AEROBIC AND ANAEROBIC Blood Culture adequate volume   Culture   Final    NO GROWTH 5 DAYS Performed at [facility], [address], [place], [place] [address]    Report Status 02/04/2018 FINAL  Final    Coagulation Studies: No results for input(s): LABPROT, INR in the last 72 hours.  Urinalysis: No results for input(s): COLORURINE, LABSPEC, PHURINE, GLUCOSEU, HGBUR, BILIRUBINUR, KETONESUR, PROTEINUR, UROBILINOGEN, NITRITE, LEUKOCYTESUR in the last 72 hours.  Invalid input(s): APPERANCEUR    Imaging: No results found.   Medications:   .  ceFAZolin (ANCEF) IV Stopped (02/04/18 2207)  . heparin 650 Units/hr (02/05/18 0500)   . acetaminophen  650 mg Oral Q8H  . aspirin EC  81 mg Oral Daily  . atorvastatin  80 mg Oral QHS  . Atropine Sulfate-NaCl  1 drop Ophthalmic BID  . carvedilol  3.125 mg Oral BID WC  . Chlorhexidine Gluconate Cloth  6 each Topical Q0600  . cholecalciferol  400 Units Oral Once per day on Mon Wed Fri  . docusate sodium  100 mg Oral BID  . epoetin (EPOGEN/PROCRIT) injection  4,000 Units Intravenous Q M,W,F-HD  . famotidine  20 mg Oral Daily  . feeding supplement (NEPRO CARB STEADY)  237 mL Oral BID BM  . insulin aspart  0-5 Units Subcutaneous QHS  . insulin aspart  0-9 Units Subcutaneous TID WC  . irbesartan  75 mg Oral Daily  . isosorbide mononitrate  30 mg Oral Daily  . multivitamin  1 tablet Oral QHS  . multivitamin with minerals  1 tablet Oral Daily  . multivitamin-lutein  1 capsule Oral Daily   acetaminophen **OR** acetaminophen, bisacodyl, HYDROmorphone (DILAUDID) injection, metoCLOPramide (REGLAN) injection, nitroGLYCERIN, ondansetron **OR** ondansetron (ZOFRAN) IV, oxyCODONE, traZODone  Assessment/ Plan:  [name] is a 67 y.o. black female with end stage renal disease on peritoneal dialysis, hypertension, congestive heart  failure, coronary artery disease status post CABG, diabetes mellitus type II insulin dependent, GERD, peripheral vascular disease admitted to [facility] on 01/29/2018 for Peripheral vascular disease ([place]) [I73.9] Right leg pain [M79.604] Left leg pain [M79.605] Ulcer of great toe, left, with unspecified severity ([place]) [L97.529]  [facility] [place] Peritoneal Dialysis CCPD 10 hours 5 exchanges 2 liter fills   1. End Stage Renal Disease with hyperkalemia: missed several days of peritoneal dialysis.  Does not want to do PD anymore as outpatient - will continue Hemodialysis tentatively M-W-F - will need to be scheduled for outpatient hemodialysis  2.  Staphylococcus aureus bacteremia/Sepsis/ Left great toe osteomyelitis -Concern for tunneled dialysis catheter infection -PermCath has been removed Temporary dialysis catheter tomorrow then tunneled dialysis catheter sometime next week Antibiotics as per internal medicine team and ID team.  2D echo shows moderate concentric LVH, grade 1 diastolic dysfunction, EF 50 to 55%, irregular thickening of the tricuspid valve with mobile echodensity suspicious for endocarditis.  TEE recommended.  3. Anemia of chronic kidney disease: hemoglobin 9.2 EPO with HD  4. Secondary Hyperparathyroidism:  - sevelamer with meals.  - Hold for now because appetite is poor - Phons 4.3  5. Diabetes mellitus type II with chronic kidney disease: insulin dependent -  Continue glucose control   6. Peripheral vascular disease: left first toe with gangrene and pain.  - underwent limb salvage angioplasty on 01/30/18     LOS: 7 Shamel Galyean 10/17/20191:49 PM

## 2018-02-05 NOTE — Progress Notes (Addendum)
Stephanie Ellis is a 67 y.o. female with a history of end-stage renal disease, diabetes mellitus coronary artery disease status post CABG, peripheral neuropathy was brought to the ED from home on 01/29/2018 with worsening pain left leg. As per patient she had noted that there was discoloration of the left foot 2 to 3 days prior to the presentation. She then felt severe pain and was unable to ambulate. As per patient she has had callus and ulcer on the left foot on and off for almost a year. She is being followed by [facility] who saw her couple of months ago. When she came to the ED her temperature was as high as 102 blood pressure was 153/68. She was noted to have a gangrenous left great toe. Blood cultures were sent she was initially started on vancomycin and Zosyn.  She was seen by podiatrist and as she had an ischemic left leg she was seen by vascular surgeon and underwent percutaneous transluminal angioplasty of the left popliteal artery and mechanical aspiration of the left popliteal artery and left anterior tibial artery and left tibioperoneal trunk and peroneal artery.  She was started on heparin. The blood culture came back positive for staph aureus bacteremia and mandatory ID consult was obtained and the Vanco and Zosyn was changed to cefazolin.  Patient states she has been on dialysis for the past 2 years. Initially she had a left AV fistula which did not work. Then she had a tunnel catheter placed on the right side of her chest. She wanted freedom and hands for the past 2 months she was been getting peritoneal dialysis. But as she has been very tired before her admission she missed 2 days of peritoneal dialysis. She also had some cramping of the abdomen. She has now decided that she will not get peritoneal dialysis anymore.  Subjective She had the dialysis catheter removed Was seen by cardiology and TEE will not be done because of the need for deep sedation She will be going for toe amputation  tomorrow ?  Objective:   Vitals:   02/05/18 1741 02/05/18 1754  BP: (!) 121/52 (!) 109/42  Pulse: 73 71  Resp:    Temp:    SpO2:     PHYSICAL EXAM:  General: Alert, cooperative, no distress, appears stated age. Pale and tired  Lungs: Clear to auscultation bilaterally. No Wheezing or Rhonchi. No rales.  Heart: G8-Z6, 2 / 6 systolic murmur  Sternal scar  Abdomen: Soft, non-tender,not distended. Bowel sounds normal. No masses, PD catheter present  Extremities: Left arm has a fistula. Left foot has areas of purplish discoloration on the dorsum of the foot. The left great toe is gangrenous ,no foul-smelling discharge  skin: No rashes or lesions. Or bruising  Lymph: Cervical, supraclavicular normal.  Neurologic: Grossly non-focal  Pertinent Labs  WBC 21 HB 9.2 PLT 197   BC 10/10 2/2 MSSA  10/11 NG  IMAGING RESULTS:  X-ray of the left foot shows erosion of the distal phalanx of the great toe  ?  Impression/Recommendation  67 y.o. female with a history of end-stage renal disease, diabetes mellitus coronary artery disease status post CABG, peripheral neuropathy was brought to the ED from home on 01/29/2018 with worsening pain left leg. As per patient she had noted that there was discoloration of the left foot 2 to 3 days prior to the presentation. She then felt severe pain and was unable to ambulate. As per patient she has had callus and ulcer on  the left foot on and off for almost a year.?  ?  ?Left great toe gangrene with the areas of ischemic islands on the foot. Status post angioplasty and aspiration. Awaiting demarcation of the foot so that she would go for amputation of the great toe.  Staph aureus bacteremia. The source of this bacteria could be either the dialysis catheter which has been present for almost a year versus peritoneal dialysis catheter versus the left foot.  The hemodialysis catheter has been removed .As TEE cannot be done will treat for atleast 6 weeks Cefazolin  can be given during dialysis so she does not need another catheter     Coronary artery disease status post CABG    Diabetes mellitus on insulin  Discussed the management with patient and her family at bedside  P.S. 2 d echo shows a tricuspid valve mobile echo density suggestive of endocarditis and hence TEE not needed

## 2018-02-05 NOTE — Consult Note (Signed)
Cardiology Consult    Patient ID: Stephanie Ellis MRN: [id number], DOB/AGE: 67-Mar-1952   Admit date: 01/29/2018 Date of Consult: 02/05/2018  Primary Physician: System, Pcp Not In Primary Cardiologist: [facility] Requesting Provider: Governor Specking, MD  Patient Profile    Stephanie Ellis is a 67 y.o. female with a history of CAD HFpEF, HTN, HL, ESRD on PD, PVD, and anemia, who is being seen today for the evaluation of bacteremia w/ abnl echocardiogram (? TV vegetation) at the request of Dr. Vianne Bulls.  Past Medical History   Past Medical History:  Diagnosis Date  . (HFpEF) heart failure with preserved ejection fraction ([place])    a. 01/2018 Echo: EF 50-55%, no rwma, Gr1 DD, triv AI, mild MR. Midly dil LA/RV, mod reduced RV fxn. Irreg thickening of TV w/ mobile echodensity that appears to arise from valve.  . Anemia   . Anorexia   . Bacteremia due to Pseudomonas   . Carotid arterial disease ([place])    a. 01/2017 Carotid U/S: RICA <99, LICA <24.  Marland Kitchen Complication of anesthesia    a. Pt reports h/o complication on 9 different occasions - ? hypotension/arrest.  . 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 01/2015 Cath [facility]): 70% LM, p-mLAD 50-60% (Resting FFR 0.75), mRCA 80-90%, ~40 Ost OM & D1-->CABG; b. 01/2016 Staged PCI of LCX x 2 and RCA.  Marland Kitchen ESRD (end stage renal disease) on dialysis ([place])    a. ESRD secondary to acute kidney failure s/p CABG-->PD  . Essential hypertension   . GERD (gastroesophageal reflux disease)   . Heart murmur   . Hypercholesterolemia   . Myocardial infarction ([place]) 2016  . PVD (peripheral vascular disease) ([place])    a. 01/30/2018 PV Angio: Sev Left Popliteal dzs s/p PTA w/ 38m lutonix DEB w/ mech aspiration of the L popliteal, L AT, and Left tibioperoneal trunck and peroneal artery.  . 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  [facility]: Dr. BMarland KitchenHaithcock: CABG x 3, LIMA to LAD, SVG to RCA, SVG to OM3, EVH  . Sinusitis 2019  . Type II diabetes mellitus  with complication (H[facility])    CAD    Past Surgical History:  Procedure Laterality Date  . ABDOMINAL HYSTERECTOMY    . ARTERIOVENOUS GRAFT PLACEMENT  05/2016  . AV FISTULA PLACEMENT Left 05/30/2016   Procedure: ARTERIOVENOUS graft;  Surgeon: JAlgernon Huxley MD;  Location: [facility] ORS;  Service: Vascular;  Laterality: Left;  . CAPD INSERTION N/A 10/30/2017   Procedure: LAPAROSCOPIC INSERTION CONTINUOUS AMBULATORY PERITONEAL DIALYSIS  (CAPD) CATHETER;  Surgeon: DAlgernon Huxley MD;  Location: [facility] ORS;  Service: Vascular;  Laterality: N/A;  . CARDIAC CATHETERIZATION  01/2015   [facility]: Ost LM 70%, p-m LAD 50-60% (Rest FFR + @ 0.75), mRCA 80-90%, ostD1 40%, pOM1 40%  . CATARACT EXTRACTION W/PHACO Left 01/18/2016   Procedure: CATARACT EXTRACTION PHACO AND INTRAOCULAR LENS PLACEMENT (IOC);  Surgeon: BEulogio Bear MD;  Location: [facility] ORS;  Service: Ophthalmology;  Laterality: Left;  [place]1.05 AP% 15.5 CDE 10.16 Fluid Pack Lot # [id number]  . CATARACT EXTRACTION W/PHACO Right 08/01/2016   Procedure: CATARACT EXTRACTION PHACO AND INTRAOCULAR LENS PLACEMENT (IOC);  Surgeon: BEulogio Bear MD;  Location: [facility] ORS;  Service: Ophthalmology;  Laterality: Right;  [place]01:00.6 AP% 11.4 CDE 6.93  LOT # [id number]  . COLONOSCOPY    . CORONARY ANGIOPLASTY     SENTS 02/12/16  . CORONARY ARTERY BYPASS GRAFT  03/28/15  [facility]: Dr. Waldemar Dickens: LIMA to LAD, SVG to RCA, SVG to OM3, EVH  . DIALYSIS/PERMA CATHETER REMOVAL Right 02/04/2018   Procedure: DIALYSIS/PERMA CATHETER REMOVAL;  Surgeon: Katha Cabal, MD;  Location: [place] CV LAB;  Service: Cardiovascular;  Laterality: Right;  . ESOPHAGOGASTRODUODENOSCOPY (EGD) WITH PROPOFOL N/A 10/24/2016   Procedure: ESOPHAGOGASTRODUODENOSCOPY (EGD) WITH PROPOFOL;  Surgeon: [name], MD;  Location: [facility] ENDOSCOPY;  Service: Endoscopy;  Laterality: N/A;  . EYE SURGERY Bilateral    cataract surgery  . EYE SURGERY     drains for glaucoma  . INSERTION EXPRESS TUBE SHUNT  Right 08/01/2016   Procedure: INSERTION EXPRESS TUBE SHUNT;  Surgeon: Eulogio Bear, MD;  Location: [facility] ORS;  Service: Ophthalmology;  Laterality: Right;  . INSERTION OF AHMED VALVE Left 08/15/2016   Procedure: INSERTION OF AHMED VALVE;  Surgeon: Eulogio Bear, MD;  Location: [facility] ORS;  Service: Ophthalmology;  Laterality: Left;  . INSERTION OF DIALYSIS CATHETER    . LOWER EXTREMITY ANGIOGRAPHY Left 01/30/2018   Procedure: Lower Extremity Angiography;  Surgeon: Katha Cabal, MD;  Location: [place] [facility] LAB;  Service: Cardiovascular;  Laterality: Left;  . TRANSTHORACIC ECHOCARDIOGRAM  January 2017    EF 60-65%. GR 2 DD. Mild degenerative mitral valve disease but no prolapse or regurgitation. Mild left atrial dilation. Mild to moderate LVH. Pericardial effusion gone  . UPPER EXTREMITY ANGIOGRAPHY Left 06/27/2016   Procedure: Upper Extremity Angiography;  Surgeon: Algernon Huxley, MD;  Location: [place] [facility];  Service: Cardiovascular;  Laterality: Left;  . UPPER EXTREMITY ANGIOGRAPHY Left 08/22/2016   Procedure: Upper Extremity Angiography;  Surgeon: Algernon Huxley, MD;  Location: [place] [facility];  Service: Cardiovascular;  Laterality: Left;     Allergies  Allergies  Allergen Reactions  . Chlorthalidone Anaphylaxis, Itching and Rash  . Fentanyl Rash  . Midazolam Rash  . Ace Inhibitors Other (See Comments)    Reaction:  Hyperkalemia, agitation   . Angiotensin Receptor Blockers Other (See Comments)    Reaction:  Hyperkalemia, agitation   . Norvasc [Amlodipine] Itching and Rash  . Phenergan [Promethazine Hcl] Anxiety    "antsy, can't sit still"    History of Present Illness    67 year old female with the above complex past medical history including CAD, HFpEF, hypertension, hyperlipidemia, end-stage renal disease on peritoneal dialysis, PVD, and anemia.  She is status post CABG x3 in December 2016 with a LIMA to the LAD, vein graft to the RCA, and vein graft to the left  circumflex.  She subsequently suffered a non-STEMI in October 2017 required PCI x2 to the native left circumflex followed by staged PCI of the RCA 3 days later due to ongoing chest pain.  The latter procedure was complicated by severe hypotension with respiratory failure and she ended up being medically managed.  Though she was previously on hemodialysis in the setting of end-stage renal disease, she has more recently been on peritoneal dialysis at home.  She was in her usual state of health until approximately 7 to 10 days ago, when she started having worsening left lower extremity pain.  With this, her family also noticed increasing lethargy and altered mental status and she was taken to the emergency department on October 10.  There, her WBCs were elevated at 13.2.  Lactic acid was elevated at 2.  She was febrile at 102.  Left foot x-ray showed soft tissue defect of the plantar aspect of the left first toe with underlying erosion of the  distal phalangeal tuft suggesting osteo-myelitis.  Patient was admitted and placed on intravenous antibiotics and seen by vascular surgery.  She underwent peripheral angiography on October 11 showing severe distal left lower extremity disease requiring drug-eluting balloon and aspiration thrombectomy.  Blood cultures have since returned positive for Staphylococcus aureus.  She has been seen by infectious disease with recommendation to continue cefazolin therapy for at least 4 weeks.  A transthoracic echocardiogram showed low normal LV function with an EF of 50 to 55% along with irregular thickening of the tricuspid valve with mobile echodensity possibly representing vegetation.  In that setting, we have been consulted for performance of a transesophageal echocardiogram.  Patient only reports fatigue at this time.  She does not typically experience chest pain at home.  She has chronic dyspnea on exertion which has been stable.  Inpatient Medications    . acetaminophen  650 mg  Oral Q8H  . aspirin EC  81 mg Oral Daily  . atorvastatin  80 mg Oral QHS  . Atropine Sulfate-NaCl  1 drop Ophthalmic BID  . carvedilol  3.125 mg Oral BID WC  . Chlorhexidine Gluconate Cloth  6 each Topical Q0600  . cholecalciferol  400 Units Oral Once per day on Mon Wed Fri  . docusate sodium  100 mg Oral BID  . epoetin (EPOGEN/PROCRIT) injection  4,000 Units Intravenous Q M,W,F-HD  . famotidine  20 mg Oral Daily  . feeding supplement (NEPRO CARB STEADY)  237 mL Oral BID BM  . insulin aspart  0-5 Units Subcutaneous QHS  . insulin aspart  0-9 Units Subcutaneous TID WC  . irbesartan  75 mg Oral Daily  . isosorbide mononitrate  30 mg Oral Daily  . multivitamin  1 tablet Oral QHS  . multivitamin with minerals  1 tablet Oral Daily  . multivitamin-lutein  1 capsule Oral Daily    Family History    Family History  Problem Relation Age of Onset  . Diabetes Mellitus II Mother   . Pancreatic cancer Father    She indicated that her mother is deceased. She indicated that her father is deceased.   Social History    Social History   Socioeconomic History  . Marital status: Legally Separated    Spouse name: Not on file  . Number of children: Not on file  . Years of education: Not on file  . Highest education level: Not on file  Occupational History  . Not on file  Social Needs  . Financial resource strain: Not on file  . Food insecurity:    Worry: Not on file    Inability: Not on file  . Transportation needs:    Medical: Not on file    Non-medical: Not on file  Tobacco Use  . Smoking status: Never Smoker  . Smokeless tobacco: Never Used  Substance and Sexual Activity  . Alcohol use: No  . Drug use: No  . Sexual activity: Never  Lifestyle  . Physical activity:    Days per week: Not on file    Minutes per session: Not on file  . Stress: Not on file  Relationships  . Social connections:    Talks on phone: Not on file    Gets together: Not on file    Attends religious  service: Not on file    Active member of club or organization: Not on file    Attends meetings of clubs or organizations: Not on file    Relationship status: Not on file  .  Intimate partner violence:    Fear of current or ex partner: Not on file    Emotionally abused: Not on file    Physically abused: Not on file    Forced sexual activity: Not on file  Other Topics Concern  . Not on file  Social History Narrative  . Not on file     Review of Systems    General:  No chills, fever, night sweats or weight changes.  Cardiovascular:  No chest pain, dyspnea on exertion, edema, orthopnea, palpitations, paroxysmal nocturnal dyspnea. Dermatological: No rash, lesions/masses Respiratory: No cough, dyspnea Urologic: No hematuria, dysuria Abdominal:   No nausea, vomiting, diarrhea, bright red blood per rectum, melena, or hematemesis Neurologic:  No visual changes, wkns, changes in mental status. All other systems reviewed and are otherwise negative except as noted above.  Physical Exam    Blood pressure (!) 96/48, pulse 68, temperature 98.7 F (37.1 C), temperature source Oral, resp. rate 17, height 5' 4"  (1.626 m), weight 55.2 kg, SpO2 94 %.  General: Pleasant, NAD Psych: Flat affect. Neuro: Alert and oriented X 3. Moves all extremities spontaneously. HEENT: Normal  Neck: Supple without bruits or JVD. Lungs:  Resp regular and unlabored, CTA. Heart: RRR no s3, s4, 2/6 syst murmur @ LLSB. Abdomen: Soft, non-tender, non-distended, BS + x 4.  Extremities: No clubbing, cyanosis or edema. DP/PT/Radials 2+ and equal bilaterally.  Labs     Lab Results  Component Value Date   WBC 21.0 (H) 02/05/2018   HGB 9.2 (L) 02/05/2018   HCT 30.8 (L) 02/05/2018   MCV 99.7 02/05/2018   PLT 197 02/05/2018    Recent Labs  Lab 02/04/18 0926  NA 135  K 4.5  CL 97*  CO2 26  BUN 50*  CREATININE 9.10*  CALCIUM 8.3*  GLUCOSE 131*     Radiology Studies    Dg Tibia/fibula Left  Result Date:  01/29/2018 CLINICAL DATA:  Presents with ED via ems from home Having pain to both lower legs But states left leg is worse Increased pain with palpation Decreased pulses to foot EXAM: LEFT TIBIA AND FIBULA - 2 VIEW COMPARISON:  None. FINDINGS: No fracture.  No bone lesion. Knee and ankle joints are normally aligned. There are arterial vascular calcifications. Soft tissues are otherwise unremarkable. IMPRESSION: Negative Electronically Signed   By: Lajean Manes M.D.   On: 01/29/2018 20:58   US Venous Img Lower Unilateral Left  Result Date: 01/29/2018 CLINICAL DATA:  Left leg pain EXAM: LEFT LOWER EXTREMITY VENOUS DOPPLER ULTRASOUND TECHNIQUE: Gray-scale sonography with graded compression, as well as color Doppler and duplex ultrasound were performed to evaluate the lower extremity deep venous systems from the level of the common femoral vein and including the common femoral, femoral, profunda femoral, popliteal and calf veins including the posterior tibial, peroneal and gastrocnemius veins when visible. The superficial great saphenous vein was also interrogated. Spectral Doppler was utilized to evaluate flow at rest and with distal augmentation maneuvers in the common femoral, femoral and popliteal veins. COMPARISON:  None. FINDINGS: Contralateral Common Femoral Vein: Respiratory phasicity is normal and symmetric with the symptomatic side. No evidence of thrombus. Normal compressibility. Common Femoral Vein: No evidence of thrombus. Normal compressibility, respiratory phasicity and response to augmentation. Saphenofemoral Junction: No evidence of thrombus. Normal compressibility and flow on color Doppler imaging. Profunda Femoral Vein: No evidence of thrombus. Normal compressibility and flow on color Doppler imaging. Femoral Vein: No evidence of thrombus. Normal compressibility, respiratory phasicity and response to  augmentation. Popliteal Vein: No evidence of thrombus. Normal compressibility, respiratory  phasicity and response to augmentation. Calf Veins: No evidence of thrombus. Normal compressibility and flow on color Doppler imaging. Superficial Great Saphenous Vein: No evidence of thrombus. Normal compressibility. Venous Reflux:  None. Other Findings:  None. IMPRESSION: No evidence of deep venous thrombosis. Electronically Signed   By: Rolm Baptise M.D.   On: 01/29/2018 18:36   Dg Toe Great Left  Result Date: 01/29/2018 CLINICAL DATA:  Open wound to the left great toe for several months. EXAM: LEFT GREAT TOE COMPARISON:  10/07/2017 FINDINGS: Degenerative changes in the first metatarsal-phalangeal joint. Soft tissue defect over the plantar aspect of the left first toe. There is underlying erosion of the distal phalangeal tuft cortex suggesting focal osteomyelitis. No evidence of acute fracture or dislocation. Diffuse bone demineralization. Prominent vascular calcifications. IMPRESSION: Soft tissue defect over the plantar aspect of the left first toe with underlying erosion of the distal phalangeal tuft suggesting osteomyelitis. Electronically Signed   By: Lucienne Capers M.D.   On: 01/29/2018 21:30    ECG & Cardiac Imaging    Sinus tachycardia, 103, LAD, RAE, inc RBBB, Lat TWI. No acute changes - personally reviewed.  Assessment & Plan    1.  Staphylococcus aureus bacteremia: In the setting of history of dialysis catheter and also peritoneal dialysis.  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  Seen by infectious disease.  Transthoracic echocardiogram suggested mobile echodensity attached to the tricuspid valve.  We have been asked to perform transesophageal echocardiogram.  Unfortunate, patient has a history of complications with anesthesia and based on her description sounds like she has experienced hypotension on at least 9 different occasions.  Understanding chest wall findings and risk of TEE, we would recommend treating with antibiotics for presumptive  endocarditis.  She does not currently meet criteria for surgical management of valvular disease.  2.  Coronary artery disease: Status post prior CABG with subsequent PCI.  She is followed at [facility].  She has not been having any chest pain.  Recommend continuation of medical therapy including aspirin, statin, beta-blocker, ARB, and nitrate.  3.  HFrEF: EF 50-55% by echo this admission.  Euvolemic on exam.  Volume management per nephrology/dialysis.  4.  Essential hypertension: Blood pressure soft.  Continue to follow.  On beta-blocker, ARB, and nitrate.  5.  Peripheral vascular disease: Status post left lower extremity PTA.  Left leg is feeling somewhat better.  Management per vascular surgery.  Continue aspirin and statin.  6.  Normocytic anemia: Stable.  Signed, Murray Hodgkins, NP 02/05/2018, 3:53 PM  For questions or updates, please contact   Please consult [email or web address] for contact info under Cardiology/STEMI.

## 2018-02-05 NOTE — Progress Notes (Signed)
[facility]  Daily Progress Note   Subjective: 1 Day Post-Op: Removal of a right IJ tunneled dialysis catheter  6 Day Post-Op: Introduction catheter into left lower extremity 3rd order catheter placement with third order catheter placement, Contrast injection left lower extremity for distal runoff, Percutaneous transluminal angioplasty left popliteal artery to 5 mm with Lutonix drug-eluting balloon, Mechanical aspiration of the left popliteal artery with the penumbra cat 6 device, Mechanical aspiration of the left anterior tibial artery with the penumbra cat 6 device, Mechanical aspiration of the left tibioperoneal trunk and peroneal artery with the penumbra cat 6 device with Star close closure right common femoral arteriotomy  Asked by nursing to assess bleeding permcath site.  Patient without complaint.  Objective: Vitals:   02/05/18 0500 02/05/18 0533 02/05/18 0751 02/05/18 1217  BP:  (!) 125/55 (!) 137/50 (!) 96/48  Pulse:  79 76 68  Resp:  20 17 17   Temp:  98.2 F (36.8 C) 98.6 F (37 C) 98.7 F (37.1 C)  TempSrc:  Oral Oral Oral  SpO2:  90% 93% 94%  Weight: 55.2 kg     Height:        Intake/Output Summary (Last 24 hours) at 02/05/2018 1731 Last data filed at 02/05/2018 0500 Gross per 24 hour  Intake 111.58 ml  Output -  Net 111.58 ml   Physical Exam: A&Ox3, NAD Chest: Permcath site, slow ooze from skin edges. Steris applied. PAD applied. No signs of hematoma, swelling, ecchymosis to the area.  CV: RRR Pulmonary: CTA Bilaterally Abdomen: Soft, Nontender, Nondistended Vascular:  Left Lower Extremity: First toe with dry gangrene. Blisters noted to dorsal aspect of foot.    Laboratory: CBC    Component Value Date/Time   WBC 21.0 (H) 02/05/2018 0413   HGB 9.2 (L) 02/05/2018 0413   HCT 30.8 (L) 02/05/2018 0413   PLT 197 02/05/2018 0413   BMET    Component Value Date/Time   NA 135 02/04/2018 0926   K 4.5 02/04/2018 0926   CL 97 (L)  02/04/2018 0926   CO2 26 02/04/2018 0926   GLUCOSE 131 (H) 02/04/2018 0926   BUN 50 (H) 02/04/2018 0926   CREATININE 9.10 (H) 02/04/2018 0926   CALCIUM 8.3 (L) 02/04/2018 0926   GFRNONAA 4 (L) 02/04/2018 0926   GFRAA 5 (L) 02/04/2018 0926   Assessment/Planning: The patient is a 67 year old female s/p a left lower extremity angiogram with intervention for an ischemic leg about six days ago - stable 1) Skin edge oozing from permcath removal. Steris and PAD applied. Will follow.  2) Still awaiting demarcation of left foot. Possible TMA vs BKA. Will continue to follow.  3) Possible endocarditis on echo - cards and medicine following 4) Nephrology following for dialysis  Discussed with Dr. Ellis Parents [facility] PA-C 02/05/2018 5:31 PM

## 2018-02-05 NOTE — Plan of Care (Signed)
L great to amputation will be performed tomorrow. Consent signed. Heparin still running. If time allows it tomorrow possible temporary dialysis catheter will be placed in order to provide dialysis if not it will be moved to Saturday per nephrologist. PAD device applied to old site where dialysis catheter was removed on 02/04/18.  Problem: Education: Goal: Knowledge of General Education information will improve Description Including pain rating scale, medication(s)/side effects and non-pharmacologic comfort measures Outcome: Progressing   Problem: Health Behavior/Discharge Planning: Goal: Ability to manage health-related needs will improve Outcome: Progressing   Problem: Clinical Measurements: Goal: Ability to maintain clinical measurements within normal limits will improve Outcome: Progressing Goal: Will remain free from infection Outcome: Progressing Goal: Diagnostic test results will improve Outcome: Progressing Goal: Respiratory complications will improve Outcome: Progressing Goal: Cardiovascular complication will be avoided Outcome: Progressing   Problem: Activity: Goal: Risk for activity intolerance will decrease Outcome: Progressing   Problem: Nutrition: Goal: Adequate nutrition will be maintained Outcome: Progressing   Problem: Coping: Goal: Level of anxiety will decrease Outcome: Progressing   Problem: Elimination: Goal: Will not experience complications related to bowel motility Outcome: Progressing Goal: Will not experience complications related to urinary retention Outcome: Progressing   Problem: Pain Managment: Goal: General experience of comfort will improve Outcome: Progressing   Problem: Safety: Goal: Ability to remain free from injury will improve Outcome: Progressing   Problem: Skin Integrity: Goal: Risk for impaired skin integrity will decrease Outcome: Progressing

## 2018-02-05 NOTE — Progress Notes (Signed)
ANTICOAGULATION CONSULT NOTE - Follow Up Consult  Pharmacy Consult for Heparin Indication: arterial embolism  Allergies  Allergen Reactions  . Chlorthalidone Anaphylaxis, Itching and Rash  . Fentanyl Rash  . Midazolam Rash  . Ace Inhibitors Other (See Comments)    Reaction:  Hyperkalemia, agitation   . Angiotensin Receptor Blockers Other (See Comments)    Reaction:  Hyperkalemia, agitation   . Norvasc [Amlodipine] Itching and Rash  . Phenergan [Promethazine Hcl] Anxiety    "antsy, can't sit still"    Patient Measurements: Height: 5' 4"  (162.6 cm) Weight: 121 lb 11.1 oz (55.2 kg) IBW/kg (Calculated) : 54.7 Heparin Dosing Weight: 56.7 kg  Vital Signs: Temp: 98.7 F (37.1 C) (10/17 1217) Temp Source: Oral (10/17 1217) BP: 96/48 (10/17 1217) Pulse Rate: 68 (10/17 1217)  Labs: Recent Labs    02/03/18 0524  02/04/18 0107 02/04/18 0926 02/05/18 0413 02/05/18 1243  HGB 8.7*  --  8.3*  --  9.2*  --   HCT 28.8*  --  28.0*  --  30.8*  --   PLT 159  --  173  --  197  --   HEPARINUNFRC  --    < > 0.54 1.08* 0.49 0.56  CREATININE  --   --   --  9.10*  --   --    < > = values in this interval not displayed.    Estimated Creatinine Clearance: 5.3 mL/min (A) (by C-G formula based on SCr of 9.1 mg/dL (H)).  Assessment: 76 yof now s/p peripheral vascular catheterization with arterial embolism. Pharmacy consulted to dose heparin. No initial bolus due to receiving heparin 5000 units IV x 1 in angio suite per Dr. Delana Meyer. Hemoglobin is trending down following limb salvage angioplasty 10/11 but stabilizing.Epo ordered with HD  Heparin was started at 800 units/hr 10/12 0351 HL 0.47:  10/12 1307 HL 0.46 10/13 0324 HL 0.30: rate increased to 850 units/hr 10/13 1200 HL 0.52 10/13 1957 HL 0.60 10/14 0410 HL 0.59 10/15 AM    HL 0.85: decrease rate to 750 units/hr 10/15 1516 HL 0.81: decrease rate to 650 units/hr 10/16 0100 HL 0.54 10/16 0926 HL 1.01: question validity of value,  order placed for redraw but patient refused and then went to vascular lab  Goal of Therapy:  Heparin level 0.3-0.7 units/ml Monitor platelets by anticoagulation protocol: Yes   Plan:  After vascular procedure MD requested that Heparin be resumed at 19:00 at previous rate of 650 units/hr without a bolus. Will check HL 8 hours after restart.  10/17 AM heparin level 0.49. Recheck in 8 hours to confirm.  10/17 1243 heparin level 0.56.  Will maintain rate of 650 units/hr and check heparin level daily with morning labs.  Evelena Asa, PharmD 02/05/18 1:35 PM

## 2018-02-05 NOTE — Anesthesia Preprocedure Evaluation (Addendum)
Anesthesia Evaluation  Patient identified by MRN, date of birth, ID band Patient awake    Reviewed: Allergy & Precautions, NPO status , Patient's Chart, lab work & pertinent test results, reviewed documented beta blocker date and time   History of Anesthesia Complications (+) history of anesthetic complications  Airway Mallampati: II  TM Distance: >3 FB     Dental  (+) Poor Dentition   Pulmonary neg pulmonary ROS, neg sleep apnea, neg COPD,    breath sounds clear to auscultation- rhonchi (-) wheezing      Cardiovascular hypertension, Pt. on medications and Pt. on home beta blockers + angina + CAD, + Past MI, + Cardiac Stents (all prior to CABG), + CABG (2016), + Peripheral Vascular Disease and +CHF  + dysrhythmias + Valvular Problems/Murmurs  Rhythm:Regular Rate:Normal - Systolic murmurs and - Diastolic murmurs    Neuro/Psych negative neurological ROS  negative psych ROS   GI/Hepatic Neg liver ROS, GERD  Controlled,  Endo/Other  diabetes, Type 2, Insulin Dependent  Renal/GU ESRF and DialysisRenal disease     Musculoskeletal negative musculoskeletal ROS (+)   Abdominal (+) - obese,   Peds negative pediatric ROS (+)  Hematology  (+) anemia ,   Anesthesia Other Findings Past Medical History: No date: (HFpEF) heart failure with preserved ejection fraction ([place])     Comment:  a. 01/2018 Echo: EF 50-55%, no rwma, Gr1 DD, triv AI,               mild MR. Midly dil LA/RV, mod reduced RV fxn. Irreg               thickening of TV w/ mobile echodensity that appears to               arise from valve. No date: Anemia No date: Anorexia No date: Bacteremia due to Pseudomonas No date: Carotid arterial disease ([place])     Comment:  a. 01/2017 Carotid U/S: RICA <78, LICA <24. No date: Complication of anesthesia     Comment:  a. Pt reports h/o complication on 9 different occasions               - ? hypotension/arrest. 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 01/2015 [facility]): 70% LM, p-mLAD 50-60% (Resting               FFR 0.75), mRCA 80-90%, ~40 Ost OM & D1-->CABG; b.               01/2016 Staged PCI of LCX x 2 and RCA. No date: ESRD (end stage renal disease) on dialysis [facility])     Comment:  a. ESRD secondary to acute kidney failure s/p CABG-->PD No date: Essential hypertension No date: GERD (gastroesophageal reflux disease) No date: Heart murmur No date: Hypercholesterolemia 2016: Myocardial infarction ([place]) No date: PVD (peripheral vascular disease) ([place])     Comment:  a. 01/30/2018 PV Angio: Sev Left Popliteal dzs s/p PTA               w/ 50m lutonix DEB w/ mech aspiration of the L popliteal,              L AT, and Left tibioperoneal trunck and peroneal artery. 03/24/2015: S/P CABG x 3     Comment:  a.  [facility]: Dr. BMarland KitchenHaithcock: CABG x 3, LIMA to LAD,              SVG  to RCA, SVG to OM3, [facility] 2019: Sinusitis No date: Type II diabetes mellitus with complication (HCC)     Comment:  CAD  Reproductive/Obstetrics                            Anesthesia Physical  Anesthesia Plan  ASA: IV  Anesthesia Plan: General   Post-op Pain Management:    Induction: Intravenous  PONV Risk Score and Plan: 1 and Propofol infusion  Airway Management Planned: Nasal Cannula  Additional Equipment:   Intra-op Plan:   Post-operative Plan:   Informed Consent: I have reviewed the patients History and Physical, chart, labs and discussed the procedure including the risks, benefits and alternatives for the proposed anesthesia with the patient or authorized representative who has indicated his/her understanding and acceptance.     Plan Discussed with: CRNA  Anesthesia Plan Comments:        Anesthesia Quick Evaluation

## 2018-02-06 ENCOUNTER — Inpatient Hospital Stay: Payer: Medicare Other | Admitting: Anesthesiology

## 2018-02-06 ENCOUNTER — Encounter: Payer: Self-pay | Admitting: Podiatry

## 2018-02-06 ENCOUNTER — Encounter
Admission: EM | Disposition: A | Discharge status: Skilled Nursing Facility | Payer: Self-pay | Source: Home / Self Care | Attending: Internal Medicine

## 2018-02-06 DIAGNOSIS — N186 End stage renal disease: Secondary | ICD-10-CM

## 2018-02-06 DIAGNOSIS — B958 Unspecified staphylococcus as the cause of diseases classified elsewhere: Secondary | ICD-10-CM

## 2018-02-06 DIAGNOSIS — Z992 Dependence on renal dialysis: Secondary | ICD-10-CM

## 2018-02-06 HISTORY — PX: AMPUTATION TOE: SHX6595

## 2018-02-06 HISTORY — PX: TEMPORARY DIALYSIS CATHETER: CATH118312

## 2018-02-06 LAB — HEPARIN LEVEL (UNFRACTIONATED): Heparin Unfractionated: 0.45 IU/mL (ref 0.30–0.70)

## 2018-02-06 LAB — CBC
HCT: 30.7 % — ABNORMAL LOW (ref 36.0–46.0)
Hemoglobin: 9.1 g/dL — ABNORMAL LOW (ref 12.0–15.0)
MCH: 29.4 pg (ref 26.0–34.0)
MCHC: 29.6 g/dL — ABNORMAL LOW (ref 30.0–36.0)
MCV: 99.4 fL (ref 80.0–100.0)
Platelets: 224 10*3/uL (ref 150–400)
RBC: 3.09 MIL/uL — ABNORMAL LOW (ref 3.87–5.11)
RDW: 20.4 % — ABNORMAL HIGH (ref 11.5–15.5)
WBC: 23.4 10*3/uL — ABNORMAL HIGH (ref 4.0–10.5)
nRBC: 0 % (ref 0.0–0.2)

## 2018-02-06 LAB — GLUCOSE, CAPILLARY
Glucose-Capillary: 168 mg/dL — ABNORMAL HIGH (ref 70–99)
Glucose-Capillary: 154 mg/dL — ABNORMAL HIGH (ref 70–99)
Glucose-Capillary: 135 mg/dL — ABNORMAL HIGH (ref 70–99)
Glucose-Capillary: 108 mg/dL — ABNORMAL HIGH (ref 70–99)
Glucose-Capillary: 146 mg/dL — ABNORMAL HIGH (ref 70–99)
Glucose-Capillary: 118 mg/dL — ABNORMAL HIGH (ref 70–99)

## 2018-02-06 LAB — MRSA PCR SCREENING: MRSA by PCR: NEGATIVE

## 2018-02-06 LAB — POTASSIUM: Potassium: 5.3 mmol/L — ABNORMAL HIGH (ref 3.5–5.1)

## 2018-02-06 SURGERY — AMPUTATION, TOE
Anesthesia: General | Site: Toe | Laterality: Left

## 2018-02-06 SURGERY — TEMPORARY DIALYSIS CATHETER
Anesthesia: LOCAL

## 2018-02-06 SURGERY — TEMPORARY DIALYSIS CATHETER
Anesthesia: Moderate Sedation

## 2018-02-06 MED ORDER — BUPIVACAINE HCL (PF) 0.5 % IJ SOLN
INTRAMUSCULAR | Status: AC
  Filled 2018-02-06: qty 30

## 2018-02-06 MED ORDER — LIDOCAINE HCL (PF) 2 % IJ SOLN
INTRAMUSCULAR | Status: AC
  Filled 2018-02-06: qty 10

## 2018-02-06 MED ORDER — CEFAZOLIN SODIUM-DEXTROSE 1-4 GM/50ML-% IV SOLN
INTRAVENOUS | Status: AC
  Filled 2018-02-06: qty 50

## 2018-02-06 MED ORDER — PROPOFOL 10 MG/ML IV BOLUS
INTRAVENOUS | Status: AC
  Filled 2018-02-06: qty 20

## 2018-02-06 MED ORDER — POVIDONE-IODINE 10 % EX SWAB: 2.0000 "application " | Freq: Once | CUTANEOUS | Status: DC

## 2018-02-06 MED ORDER — PROPOFOL 10 MG/ML IV BOLUS
INTRAVENOUS | Status: DC | PRN
  Administered 2018-02-06: 10 mg via INTRAVENOUS
  Administered 2018-02-06: 30 mg via INTRAVENOUS

## 2018-02-06 MED ORDER — LIDOCAINE HCL (PF) 1 % IJ SOLN
INTRAMUSCULAR | Status: DC | PRN
  Administered 2018-02-06: 5 mL

## 2018-02-06 MED ORDER — PROPOFOL 500 MG/50ML IV EMUL
INTRAVENOUS | Status: DC | PRN
  Administered 2018-02-06: 35 ug/kg/min via INTRAVENOUS

## 2018-02-06 MED ORDER — VANCOMYCIN HCL IN DEXTROSE 1-5 GM/200ML-% IV SOLN
INTRAVENOUS | Status: AC
  Filled 2018-02-06: qty 200

## 2018-02-06 MED ORDER — LIDOCAINE HCL (CARDIAC) PF 100 MG/5ML IV SOSY
PREFILLED_SYRINGE | INTRAVENOUS | Status: DC | PRN
  Administered 2018-02-06: 50 mg via INTRAVENOUS

## 2018-02-06 MED ORDER — ONDANSETRON HCL 4 MG/2ML IJ SOLN: 4.0000 mg | Freq: Once | INTRAMUSCULAR | Status: DC | PRN

## 2018-02-06 MED ORDER — LIDOCAINE HCL (PF) 1 % IJ SOLN
INTRAMUSCULAR | Status: AC
  Filled 2018-02-06: qty 30

## 2018-02-06 MED ORDER — LIDOCAINE HCL (PF) 1 % IJ SOLN
INTRAMUSCULAR | Status: DC | PRN
  Administered 2018-02-06: 5 mL via INTRADERMAL

## 2018-02-06 MED ORDER — BUPIVACAINE HCL 0.5 % IJ SOLN
INTRAMUSCULAR | Status: DC | PRN
  Administered 2018-02-06: 5 mL

## 2018-02-06 SURGICAL SUPPLY — 42 items
BANDAGE ELASTIC 4 LF NS (GAUZE/BANDAGES/DRESSINGS) ×2 IMPLANT
BLADE OSC/SAGITTAL MD 5.5X18 (BLADE) IMPLANT
BLADE SURG MINI STRL (BLADE) IMPLANT
BNDG CONFORM 3 STRL LF (GAUZE/BANDAGES/DRESSINGS) ×4 IMPLANT
BNDG ESMARK 4X12 TAN STRL LF (GAUZE/BANDAGES/DRESSINGS) IMPLANT
BNDG GAUZE 4.5X4.1 6PLY STRL (MISCELLANEOUS) ×2 IMPLANT
CANISTER SUCT 1200ML W/VALVE (MISCELLANEOUS) ×2 IMPLANT
COVER WAND RF STERILE (DRAPES) ×2 IMPLANT
DRAPE FLUOR MINI C-ARM 54X84 (DRAPES) IMPLANT
DRAPE XRAY CASSETTE 23X24 (DRAPES) IMPLANT
DURAPREP 26ML APPLICATOR (WOUND CARE) ×2 IMPLANT
ELECT REM PT RETURN 9FT ADLT (ELECTROSURGICAL) ×2
ELECTRODE REM PT RTRN 9FT ADLT (ELECTROSURGICAL) ×1 IMPLANT
GAUZE PACKING IODOFORM 1/2 (PACKING) ×2 IMPLANT
GAUZE PETRO XEROFOAM 1X8 (MISCELLANEOUS) ×2 IMPLANT
GAUZE SPONGE 4X4 12PLY STRL (GAUZE/BANDAGES/DRESSINGS) ×2 IMPLANT
GAUZE STRETCH 2X75IN STRL (MISCELLANEOUS) ×2 IMPLANT
GLOVE BIO SURGEON STRL SZ7.5 (GLOVE) ×2 IMPLANT
GLOVE INDICATOR 8.0 STRL GRN (GLOVE) ×2 IMPLANT
GOWN STRL REUS W/ TWL LRG LVL3 (GOWN DISPOSABLE) ×2 IMPLANT
GOWN STRL REUS W/TWL LRG LVL3 (GOWN DISPOSABLE) ×2
KIT TURNOVER KIT A (KITS) ×2 IMPLANT
LABEL OR SOLS (LABEL) ×2 IMPLANT
NEEDLE FILTER BLUNT 18X 1/2SAF (NEEDLE) ×1
NEEDLE FILTER BLUNT 18X1 1/2 (NEEDLE) ×1 IMPLANT
NEEDLE HYPO 25X1 1.5 SAFETY (NEEDLE) ×2 IMPLANT
NS IRRIG 500ML POUR BTL (IV SOLUTION) ×2 IMPLANT
PACK EXTREMITY ARMC (MISCELLANEOUS) ×2 IMPLANT
PAD ABD DERMACEA PRESS 5X9 (GAUZE/BANDAGES/DRESSINGS) ×4 IMPLANT
PULSAVAC PLUS IRRIG FAN TIP (DISPOSABLE)
SHIELD FULL FACE ANTIFOG 7M (MISCELLANEOUS) ×2 IMPLANT
SOL .9 NS 3000ML IRR  AL (IV SOLUTION)
SOL .9 NS 3000ML IRR UROMATIC (IV SOLUTION) IMPLANT
STOCKINETTE M/LG 89821 (MISCELLANEOUS) ×2 IMPLANT
STRAP SAFETY 5IN WIDE (MISCELLANEOUS) ×2 IMPLANT
SUT ETHILON 3-0 FS-10 30 BLK (SUTURE) ×2
SUT ETHILON 5-0 FS-2 18 BLK (SUTURE) ×2 IMPLANT
SUT VIC AB 4-0 FS2 27 (SUTURE) ×2 IMPLANT
SUTURE EHLN 3-0 FS-10 30 BLK (SUTURE) ×1 IMPLANT
SWAB CULTURE AMIES ANAERIB BLU (MISCELLANEOUS) ×2 IMPLANT
SYR 10ML LL (SYRINGE) ×6 IMPLANT
TIP FAN IRRIG PULSAVAC PLUS (DISPOSABLE) IMPLANT

## 2018-02-06 SURGICAL SUPPLY — 1 items: KIT DIALYSIS CATH TRI 30X13 (CATHETERS) ×2 IMPLANT

## 2018-02-06 NOTE — Anesthesia Postprocedure Evaluation (Signed)
Anesthesia Post Note  Patient: Azana Kiesler  Procedure(s) Performed: AMPUTATION GREAT TOE (Left Toe)  Patient location during evaluation: PACU Anesthesia Type: General Level of consciousness: awake and alert and oriented Pain management: pain level controlled Vital Signs Assessment: post-procedure vital signs reviewed and stable Respiratory status: spontaneous breathing, nonlabored ventilation and respiratory function stable Cardiovascular status: blood pressure returned to baseline and stable Postop Assessment: no signs of nausea or vomiting Anesthetic complications: no     Last Vitals:  Vitals:   02/06/18 0951 02/06/18 1148  BP: (!) 127/47 (!) 121/43  Pulse: 70 65  Resp: 14 12  Temp:  37 C  SpO2: 96% 93%    Last Pain:  Vitals:   02/06/18 1148  TempSrc: Oral  PainSc: 0-No pain                 Ashton Sabine

## 2018-02-06 NOTE — Progress Notes (Signed)
Stephanie Ellis is a 67 y.o. female with a history of end-stage renal disease, diabetes mellitus coronary artery disease status post CABG, peripheral neuropathy was brought to the ED from home on 01/29/2018 with worsening pain left leg. As per patient she had noted that there was discoloration of the left foot 2 to 3 days prior to the presentation. She then felt severe pain and was unable to ambulate. As per patient she has had callus and ulcer on the left foot on and off for almost a year. She is being followed by [facility] who saw her couple of months ago. When she came to the ED her temperature was as high as 102 blood pressure was 153/68. She was noted to have a gangrenous left great toe. Blood cultures were sent she was initially started on vancomycin and Zosyn.  She was seen by podiatrist and as she had an ischemic left leg she was seen by vascular surgeon and underwent percutaneous transluminal angioplasty of the left popliteal artery and mechanical aspiration of the left popliteal artery and left anterior tibial artery and left tibioperoneal trunk and peroneal artery.  She was started on heparin. The blood culture came back positive for staph aureus bacteremia and mandatory ID consult was obtained and the Vanco and Zosyn was changed to cefazolin.  Patient states she has been on dialysis for the past 2 years. Initially she had a left AV fistula which did not work. Then she had a tunnel catheter placed on the right side of her chest. She wanted freedom and hands for the past 2 months she was been getting peritoneal dialysis. But as she has been very tired before her admission she missed 2 days of peritoneal dialysis. She also had some cramping of the abdomen. She has now decided that she will not get peritoneal dialysis anymore.  Subjective Underwent left great toe amputation and resting?  Objective:   Vitals:   02/06/18 1658 02/06/18 2010  BP: (!) 102/48 (!) 119/42  Pulse: 65 65  Resp:  16   Temp:  98.5 F (36.9 C)  SpO2:  94%   PHYSICAL EXAM:  General: resting, no distress Lungs: Clear to auscultation bilaterally. No Wheezing or Rhonchi. No rales.  Heart: E5-U3, 2 / 6 systolic murmur  Sternal scar  Abdomen: Soft, non-tender,not distended. Bowel sounds normal. No masses, PD catheter present  Extremities: left leg surgical dressing not removed   skin: No rashes or lesions. Or bruising  Lymph: Cervical, supraclavicular normal.  Neurologic: Grossly non-focal  Pertinent Labs  CBC Latest Ref Rng & Units 02/06/2018 02/05/2018 02/04/2018  WBC 4.0 - 10.5 K/uL 23.4(H) 21.0(H) 18.0(H)  Hemoglobin 12.0 - 15.0 g/dL 9.1(L) 9.2(L) 8.3(L)  Hematocrit 36.0 - 46.0 % 30.7(L) 30.8(L) 28.0(L)  Platelets 150 - 400 K/uL 224 197 173    BC 10/10 2/2 MSSA  10/11 NG  IMAGING RESULTS:  X-ray of the left foot shows erosion of the distal phalanx of the great toe  ?  Impression/Recommendation  67 y.o. female with a history of end-stage renal disease, diabetes mellitus coronary artery disease status post CABG, peripheral neuropathy was brought to the ED from home on 01/29/2018 with worsening pain left leg. As per patient she had noted that there was discoloration of the left foot 2 to 3 days prior to the presentation. She then felt severe pain and was unable to ambulate. As per patient she has had callus and ulcer on the left foot on and off for almost a year.?  ?  ?  Left great toe gangrene with the areas of ischemic islands on the foot. 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 Has tricuspid endocarditis The hemodialysis catheter has been removed . Will need 6 weeks of IV Cefazolin which can be given during dialysis so she does not need another catheter  2 grams, 2grams and 3 grams until 03/13/18 with weekly CBC/CMP   Coronary artery disease status post CABG    Diabetes mellitus on insulin  Discussed the management with patient and her family at bedside. Discussed with  nephrologist. ID will follow her peripherally this weekend

## 2018-02-06 NOTE — Progress Notes (Signed)
Pre HD Treatment    02/06/18 1420  Vital Signs  Temp 99 F (37.2 C)  Temp Source Oral  Pulse Rate 69  Pulse Rate Source Monitor  Resp 14  BP (!) 120/48  BP Location Right Arm  BP Method Automatic  Patient Position (if appropriate) Lying  Oxygen Therapy  SpO2 96 %  O2 Device Room Air  Dialysis Weight  Weight 56.8 kg  Type of Weight Pre-Dialysis  Time-Out for Hemodialysis  What Procedure? HD  Pt Identifiers(min of two) First/Last Name;MRN/Account#;Pt's DOB(use if MRN/Acct# not available  Correct Site? Yes  Correct Side? Yes  Correct Procedure? Yes  Consents Verified? Yes  Rad Studies Available? N/A  Safety Precautions Reviewed? Yes  Engineer, civil (consulting) Number  ([id number])  Station Number 1  UF/Alarm Test Passed  Conductivity: Meter 14  Conductivity: Machine  13.9  pH 7.4  Reverse Osmosis Main  Normal Saline Lot Number [id number]  Dialyzer Lot Number [id number]  Disposable Set Lot Number [id number]  Machine Temperature 98.6 F (37 C)  Musician and Audible Yes  Blood Lines Intact and Secured Yes  Pre Treatment Patient Checks  Vascular access used during treatment Catheter  Hepatitis B Surface Antigen Results Negative  Date Hepatitis B Surface Antigen Drawn 11/26/17  Hepatitis B Surface Antibody  (>10)  Date Hepatitis B Surface Antibody Drawn 11/26/17  Hemodialysis Consent Verified Yes  Hemodialysis Standing Orders Initiated Yes  ECG (Telemetry) Monitor On Yes  Prime Ordered Normal Saline  Length of  DialysisTreatment -hour(s) 3.5 Hour(s)  Dialyzer Elisio 17H NR  Dialysate 3K, 2.5 Ca  Dialysis Anticoagulant None  Dialysate Flow Ordered 800  Blood Flow Rate Ordered 400 mL/min  Ultrafiltration Goal 1.5 Liters  Dialysis Blood Pressure Support Ordered Normal Saline  Education / Care Plan  Dialysis Education Provided Yes  Documented Education in Care Plan Yes  Hemodialysis Catheter Right Femoral vein Triple-lumen;Temporary  Placement Date/Time: 02/06/18 (c)    Placed prior to admission: No  Orientation: Right  Access Location: Femoral vein  Hemodialysis Catheter Type: Triple-lumen;Temporary  Site Condition Bleeding  Blue Lumen Status Other (Comment)  Red Lumen Status Other (Comment)  Purple Lumen Status Other (Comment)  Catheter fill solution Other (Comment)  Catheter fill volume (Arterial) 1.8 cc  Catheter fill volume (Venous) 1.8  Dressing Type Biopatch  Dressing Status Clean;Dry;Intact  Drainage Description Serous  Post treatment catheter status Capped and Clamped

## 2018-02-06 NOTE — Progress Notes (Signed)
HD Treatment Complete  Patient did not complete entire treatment. Her catheter was not functional. Dr. Candiss Norse aware and states that HD Treatment will resume tomorrow.     02/06/18 1445  Vital Signs  Pulse Rate 69  Pulse Rate Source Monitor  Resp 14  BP 121/61  BP Location Right Arm  BP Method Automatic  Patient Position (if appropriate) Lying  Oxygen Therapy  SpO2 97 %  O2 Device Room Air  During Hemodialysis Assessment  Intra-Hemodialysis Comments See progress note;Tx completed  Hemodialysis Catheter Right Femoral vein Triple-lumen;Temporary  Placement Date/Time: 02/06/18 (c)   Placed prior to admission: No  Orientation: Right  Access Location: Femoral vein  Hemodialysis Catheter Type: Triple-lumen;Temporary  Blue Lumen Status Flushed;Saline locked  Red Lumen Status Flushed;Saline locked  Purple Lumen Status Other (Comment)  Catheter fill volume (Arterial) 1.8 cc  Catheter fill volume (Venous) 1.8  Dressing Type Biopatch;Occlusive  Dressing Status Clean;Dry;Intact  Dressing Change Due 02/07/18  Post treatment catheter status Capped and Clamped

## 2018-02-06 NOTE — Care Management Important Message (Signed)
Copy of signed IM left with patient in room.  

## 2018-02-06 NOTE — Progress Notes (Signed)
Hold heparin because of epistaxis, discussed with Dr. Candiss Norse.  Hemodialysis tomorrow.

## 2018-02-06 NOTE — Progress Notes (Signed)
ANTICOAGULATION CONSULT NOTE - Follow Up Consult  Pharmacy Consult for Heparin Indication: arterial embolism  Allergies  Allergen Reactions  . Chlorthalidone Anaphylaxis, Itching and Rash  . Fentanyl Rash  . Midazolam Rash  . Ace Inhibitors Other (See Comments)    Reaction:  Hyperkalemia, agitation   . Angiotensin Receptor Blockers Other (See Comments)    Reaction:  Hyperkalemia, agitation   . Norvasc [Amlodipine] Itching and Rash  . Phenergan [Promethazine Hcl] Anxiety    "antsy, can't sit still"    Patient Measurements: Height: 5' 4"  (162.6 cm) Weight: 124 lb 1.6 oz (56.3 kg) IBW/kg (Calculated) : 54.7 Heparin Dosing Weight: 56.7 kg  Vital Signs: Temp: 99.1 F (37.3 C) (10/18 0425) Temp Source: Oral (10/18 0425) BP: 155/52 (10/18 0425) Pulse Rate: 68 (10/18 0425)  Labs: Recent Labs    02/04/18 0107 02/04/18 0926 02/05/18 0413 02/05/18 1243 02/06/18 0511  HGB 8.3*  --  9.2*  --  9.1*  HCT 28.0*  --  30.8*  --  30.7*  PLT 173  --  197  --  224  HEPARINUNFRC 0.54 1.08* 0.49 0.56 0.45  CREATININE  --  9.10*  --   --   --     Estimated Creatinine Clearance: 5.3 mL/min (A) (by C-G formula based on SCr of 9.1 mg/dL (H)).  Assessment: 53 yof now s/p peripheral vascular catheterization with arterial embolism. Pharmacy consulted to dose heparin. No initial bolus due to receiving heparin 5000 units IV x 1 in angio suite per Dr. Delana Meyer. Hemoglobin is trending down following limb salvage angioplasty 10/11 but stabilizing.Epo ordered with HD  Heparin was started at 800 units/hr 10/12 0351 HL 0.47:  10/12 1307 HL 0.46 10/13 0324 HL 0.30: rate increased to 850 units/hr 10/13 1200 HL 0.52 10/13 1957 HL 0.60 10/14 0410 HL 0.59 10/15 AM    HL 0.85: decrease rate to 750 units/hr 10/15 1516 HL 0.81: decrease rate to 650 units/hr 10/16 0100 HL 0.54 10/16 0926 HL 1.01: question validity of value, order placed for redraw but patient refused and then went to vascular  lab  Goal of Therapy:  Heparin level 0.3-0.7 units/ml Monitor platelets by anticoagulation protocol: Yes   Plan:  After vascular procedure MD requested that Heparin be resumed at 19:00 at previous rate of 650 units/hr without a bolus. Will check HL 8 hours after restart.  10/17 AM heparin level 0.49. Recheck in 8 hours to confirm.  10/17 1243 heparin level 0.56.  Will maintain rate of 650 units/hr and check heparin level daily with morning labs.  10/18 AM heparin level 0.45. Continue current regimen. Recheck heparin level and CBC with tomorrow AM labs.  Sim Boast, PharmD, BCPS  02/06/18 7:27 AM

## 2018-02-06 NOTE — Anesthesia Post-op Follow-up Note (Signed)
Anesthesia QCDR form completed.        

## 2018-02-06 NOTE — Progress Notes (Signed)
HD Treatment Initiated    02/06/18 1437  Vital Signs  Pulse Rate 66  Pulse Rate Source Monitor  Resp 16  Oxygen Therapy  SpO2 (!) 85 % (rechecked, pt at 97%)  During Hemodialysis Assessment  Blood Flow Rate (mL/min) 200 mL/min  Arterial Pressure (mmHg) -310 mmHg  Venous Pressure (mmHg) 90 mmHg  Transmembrane Pressure (mmHg) 40 mmHg  Ultrafiltration Rate (mL/min) 960 mL/min  Dialysate Flow Rate (mL/min) 800 ml/min  Conductivity: Machine  13.8  HD Safety Checks Performed Yes  Intra-Hemodialysis Comments Tx initiated  Hemodialysis Catheter Right Femoral vein Triple-lumen;Temporary  Placement Date/Time: 02/06/18 (c)   Placed prior to admission: No  Orientation: Right  Access Location: Femoral vein  Hemodialysis Catheter Type: Triple-lumen;Temporary  Blue Lumen Status Infusing  Red Lumen Status Infusing

## 2018-02-06 NOTE — Clinical Social Work Note (Signed)
Patient going for toe amputation today and patient currently is being monitored for dialysis access. Not ready for discharge. Shela Leff MSW,LCSW [phone]

## 2018-02-06 NOTE — Transfer of Care (Signed)
Immediate Anesthesia Transfer of Care Note  Patient: Stephanie Ellis  Procedure(s) Performed: AMPUTATION GREAT TOE (Left Toe)  Patient Location: PACU  Anesthesia Type:MAC  Level of Consciousness: awake, alert  and responds to stimulation  Airway & Oxygen Therapy: Patient Spontanous Breathing and Patient connected to face mask oxygen  Post-op Assessment: Report given to RN and Post -op Vital signs reviewed and stable  Post vital signs: Reviewed and stable  Last Vitals:  Vitals Value Taken Time  BP 108/48 02/06/2018  9:24 AM  Temp    Pulse 69 02/06/2018  9:25 AM  Resp 14 02/06/2018  9:25 AM  SpO2 93 % 02/06/2018  9:25 AM  Vitals shown include unvalidated device data.  Last Pain:  Vitals:   02/06/18 0847  TempSrc: Temporal  PainSc:       Patients Stated Pain Goal: 0 (33/54/56 2563)  Complications: No apparent anesthesia complications

## 2018-02-06 NOTE — Progress Notes (Signed)
Progress Note  Patient Name: Stephanie Ellis Date of Encounter: 02/06/2018  Primary Cardiologist: New - Dr. Saunders Revel  Subjective   S/p L toe amputation yesterday. Dialysis catheter removed. No chest pain, cardiac complaints.  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 Defer TEE given h/o poor reaction to anesthesia and as high risk for morbidity / mortality if underwent valve repair or placement.   Continue medical management for CAD/PAD  Inpatient Medications    Scheduled Meds: . acetaminophen  650 mg Oral Q8H  . aspirin EC  81 mg Oral Daily  . atorvastatin  80 mg Oral QHS  . Atropine Sulfate-NaCl  1 drop Ophthalmic BID  . carvedilol  3.125 mg Oral BID WC  . Chlorhexidine Gluconate Cloth  6 each Topical Q0600  . cholecalciferol  400 Units Oral Once per day on Mon Wed Fri  . docusate sodium  100 mg Oral BID  . epoetin (EPOGEN/PROCRIT) injection  4,000 Units Intravenous Q M,W,F-HD  . famotidine  20 mg Oral Daily  . feeding supplement (NEPRO CARB STEADY)  237 mL Oral BID BM  . insulin aspart  0-5 Units Subcutaneous QHS  . insulin aspart  0-9 Units Subcutaneous TID WC  . irbesartan  75 mg Oral Daily  . isosorbide mononitrate  30 mg Oral Daily  . multivitamin  1 tablet Oral QHS  . multivitamin with minerals  1 tablet Oral Daily  . multivitamin-lutein  1 capsule Oral Daily   Continuous Infusions: .  ceFAZolin (ANCEF) IV 0 mL/hr at 02/05/18 2240  . heparin Stopped (02/06/18 0744)   PRN Meds: acetaminophen **OR** acetaminophen, bisacodyl, HYDROmorphone (DILAUDID) injection, metoCLOPramide (REGLAN) injection, nitroGLYCERIN, ondansetron **OR** ondansetron (ZOFRAN) IV, oxyCODONE, traZODone   Vital Signs    Vitals:   02/06/18 0933 02/06/18 0939 02/06/18 0951 02/06/18 1148  BP:  (!) 123/49 (!) 127/47 (!) 121/43  Pulse:  71 70 65  Resp:  14 14 12   Temp:    98.6 F (37 C)  TempSrc:    Oral  SpO2: 97% 98% 96% 93%  Weight:      Height:         Intake/Output Summary (Last 24 hours) at 02/06/2018 1422 Last data filed at 02/06/2018 1352 Gross per 24 hour  Intake 451.87 ml  Output 1 ml  Net 450.87 ml   Filed Weights   02/04/18 1248 02/05/18 0500 02/06/18 0425  Weight: 56.6 kg 55.2 kg 56.3 kg    Telemetry    SR HR 60s-70s - Personally Reviewed  Physical Exam   GEN: No acute distress.   Neck: No JVD noted on exam Cardiac: RRR, 2/6 murmur, no rubs, or gallops.  Respiratory: Mildly diminished at bases  GI: Soft, nontender, non-distended  MS: No edema; No deformity. L arm fistula. L foot amputation of great toe Neuro:  Nonfocal  Psych: Normal affect   Labs    Chemistry Recent Labs  Lab 02/04/18 0926 02/06/18 0511  NA 135  --   K 4.5 5.3*  CL 97*  --   CO2 26  --   GLUCOSE 131*  --   BUN 50*  --   CREATININE 9.10*  --   CALCIUM 8.3*  --   ALBUMIN 2.0*  --   GFRNONAA 4*  --   GFRAA 5*  --   ANIONGAP 12  --      Hematology Recent Labs  Lab 02/04/18 0107 02/05/18 0413 02/06/18 0511  WBC 18.0* 21.0* 23.4*  RBC 2.82* 3.09* 3.09*  HGB 8.3* 9.2* 9.1*  HCT 28.0* 30.8* 30.7*  MCV 99.3 99.7 99.4  MCH 29.4 29.8 29.4  MCHC 29.6* 29.9* 29.6*  RDW 19.7* 19.9* 20.4*  PLT 173 197 224    Cardiac EnzymesNo results for input(s): TROPONINI in the last 168 hours. No results for input(s): TROPIPOC in the last 168 hours.   BNPNo results for input(s): BNP, PROBNP in the last 168 hours.   DDimer No results for input(s): DDIMER in the last 168 hours.   Radiology    No results found.  Cardiac Studies   02/04/18 TTE Left ventricle: The cavity size was normal. Wall thickness was   increased in a pattern of moderate LVH. There was moderate   concentric hypertrophy. Systolic function was low normal to   mildly reduced. The estimated ejection fraction was in the range   of 50% to 55%. Regional wall motion abnormalities cannot be   excluded. Doppler parameters are consistent with abnormal left   ventricular  relaxation (grade 1 diastolic dysfunction). Doppler   parameters are consistent with high ventricular filling pressure. - Aortic valve: There was trivial regurgitation. - Mitral valve: Moderately thickened, moderately calcified leaflets   . Leaflet separation was reduced. There was mild regurgitation. - Left atrium: The atrium was mildly dilated. - Right ventricle: The cavity size was mildly dilated. Systolic   function was moderately reduced. - Tricuspid valve: There is irregular thickening of the tricuspid   valve with a mobile echodensity that appears to arise from the   valve, suspicious for endocarditis in the setting of bacteremia.   Further evaluation with transesophageal echocardiogram should be   considered.    Patient Profile     67 y.o. female with h/o CAD s/p 2016 CABG x3 (LIMA-LAD, SVG-RCA, SVG-LCx), 2017 NSTEMI 01/2016 with PCIx2 to native LCx then staged PCI of RCA 3d later d/t ongoing CP and complicated by severe hypotension with respiratory failure, HFpEF (EF 50-55%), HTN, HLD, DM2, ESRD on PD, PVD, Carotid arterial disease, and anemia being seen for the evaluation of bacteremia with abnormal echo (? Suspicion for TV vegetation) and at the request of Dr. Vianne Bulls.  Assessment & Plan    1. Staphylococcus Aureus Bacteremia -In setting of dialysis catheter / PD. Admitted with L ischemic foot (s/p amputation of L great toe), possible osteomyelitis of LLE complicated by MSSA bacteremia and seen by ID.. S/p removal of permacath with plans for replacement on Monday. - TTE with mobile echodensity attached to TV. Recommendation to defer TEE d/t anesthesia reactions in past as noted in original consult note. If persistently bacteremic or unstable, can reconsider but not advised at this time. - Continue heparin. Vascular surgery recommending start home Eliquis by discharge but currently held d/t likely permacath placement on Monday - Treating with antibiotics for presumptive  endocarditis involving the tricuspid valve with no plans for further intervention or evaluation at this time.  2. CAD s/p 2016 CABG & 2017 PCI  - No CP - Followed by [facility]. Recommend with primary [facility] cardiologist after discharge. - Plan to continue medical therapy with ASA, statin, BB, nitrates, ARB  3. HFrEF (20-55%)  - Euvolemic on exam - Volume management per nephrology, dialysis  4. Essential HTN - Continue BB, ARB, nitrate for management  - Per IM  5. PVD - S/p LLE PTA - Continue ASA, statin - Per vascular surgery  6. Normocytic anemia - Stable  7. H/o of anesthesia complication  - Reported 9 episodes of hypotension  For questions  or updates, please contact Martin Please consult [email or web address] for contact info under        Signed, Arvil Chaco, PA-C  02/06/2018, 2:22 PM

## 2018-02-06 NOTE — Anesthesia Procedure Notes (Signed)
Procedure Name: MAC Performed by: Lance Muss, CRNA Pre-anesthesia Checklist: Patient identified, Emergency Drugs available, Suction available, Patient being monitored and Timeout performed Patient Re-evaluated:Patient Re-evaluated prior to induction Oxygen Delivery Method: Nasal cannula

## 2018-02-06 NOTE — Progress Notes (Signed)
Pre HD Assessment    02/06/18 1430  Neurological  Level of Consciousness Alert  Orientation Level Oriented X4  Respiratory  Respiratory Pattern Regular;Unlabored;Symmetrical  Chest Assessment Chest expansion symmetrical  Bilateral Breath Sounds Clear  Cardiac  Pulse Regular  Heart Sounds S1, S2  Jugular Venous Distention (JVD) No  ECG Monitor Yes  Cardiac Rhythm NSR  Vascular  R Radial Pulse +2  L Radial Pulse +2  R Dorsalis Pedis Pulse Other (Comment)  L Dorsalis Pedis Pulse Other (Comment)  Integumentary  Integumentary (WDL) X  Skin Color Appropriate for ethnicity  Skin Condition Dry  Skin Integrity Surgical Incision (see LDA)  Musculoskeletal  Musculoskeletal (WDL) X  Generalized Weakness Yes  Assistive Device None  GU Assessment  Genitourinary (WDL) X (HD pt)  Peritoneal Catheter Left lower abdomen  No Placement Date or Time found.   Catheter Location: Left lower abdomen  Site Assessment Clean;Dry;Intact  Drainage Description None  Dressing Gauze/Drain sponge;Occlusive  Dressing Status Clean;Dry;Intact  Incision (Closed) 02/06/18 Foot Left  Date First Assessed/Time First Assessed: 02/06/18 0919   Location: Foot  Location Orientation: Left  Dressing Type Other (Comment)  Dressing Clean;Dry;Intact

## 2018-02-06 NOTE — Op Note (Signed)
  OPERATIVE NOTE   PROCEDURE: 1. Insertion of temporary dialysis catheter catheter right femoral approach.  PRE-OPERATIVE DIAGNOSIS: End-stage renal disease requiring hemodialysis; endocarditis  POST-OPERATIVE DIAGNOSIS: Same  SURGEON: Katha Cabal M.D.  ANESTHESIA: 1% lidocaine local infiltration  ESTIMATED BLOOD LOSS: Minimal cc  INDICATIONS:   Stephanie Ellis is a 67 y.o. female who presents with positive blood cultures for staph.  TEE revealed vegetations of the heart valves.  She is undergone removal of her tunneled catheter approximately 48 hours ago.  Temporary catheter is now being placed so that she can continue dialysis.  Plans for reinsertion of a tunneled catheter will be made at a later date when her blood cultures are negative.  DESCRIPTION: After obtaining full informed written consent, the patient was positioned supine. The right groin was prepped and draped in a sterile fashion. Ultrasound was placed in a sterile sleeve. Ultrasound was utilized to identify the right common femoral vein which is noted to be echolucent and compressible indicating patency. Images recorded for the permanent record. Under real-time visualization a Seldinger needle is inserted into the vein and the guidewires advanced without difficulty. Small counterincision was made at the wire insertion site. Dilator is passed over the wire and the temporary dialysis catheter catheter is fed over the wire without difficulty.  All lumens aspirate and flush easily and are packed with heparin saline. Catheter secured to the skin of the right thigh with 2-0 silk. A sterile dressing is applied with Biopatch.  COMPLICATIONS: None  CONDITION: Unchanged  Hortencia Pilar Office:  [phone] 02/06/2018, 12:20 PM

## 2018-02-06 NOTE — Progress Notes (Signed)
[facility]  ROUNDING NOTE   Subjective:   Stephanie Ellis admitted to [facility] on 01/29/2018 for Peripheral vascular disease [facility]) [I73.9] Right leg pain [M79.604] Left leg pain [M79.605] Ulcer of great toe, left, with unspecified severity ([place]) [L97.529]   PermCath was removed due to bacteremia Feels fair today.  No acute complaints Patient underwent amputation of the left great toe  Objective:  Vital signs in last 24 hours:  Temp:  [97.7 F (36.5 C)-99.1 F (37.3 C)] 99 F (37.2 C) (10/18 1420) Pulse Rate:  [65-78] 67 (10/18 1500) Resp:  [12-24] 18 (10/18 1500) BP: (108-155)/(42-63) 114/47 (10/18 1500) SpO2:  [85 %-100 %] 91 % (10/18 1500) Weight:  [56.3 kg-56.8 kg] 56.8 kg (10/18 1420)  Weight change: -1.609 kg Filed Weights   02/05/18 0500 02/06/18 0425 02/06/18 1420  Weight: 55.2 kg 56.3 kg 56.8 kg    Intake/Output: I/O last 3 completed shifts: In: 563.5 [P.O.:240; I.V.:222.9; IV Piggyback:100.5] Out: 0    Intake/Output this shift:  Total I/O In: 61.3 [I.V.:11.3; IV Piggyback:50] Out: -38 [Blood:1]  Physical Exam: General: NAD, laying in bed, chronically ill-appearing  Head: Normocephalic, atraumatic. Moist oral mucosal membranes  Eyes: Anicteric   Neck: Supple,    Lungs:  Clear to auscultation  Heart: Regular rate and rhythm  Abdomen:  Soft, nontender,   Extremities:  Foot in bandages  Neurologic: Alert, oreinted  Access: PD catheter,    Basic Metabolic Panel: Recent Labs  Lab 02/02/18 0410 02/04/18 0926 02/06/18 0511  NA  --  135  --   K  --  4.5 5.3*  CL  --  97*  --   CO2  --  26  --   GLUCOSE  --  131*  --   BUN  --  50*  --   CREATININE  --  9.10*  --   CALCIUM  --  8.3*  --   PHOS 9.1* 4.3  --     Liver Function Tests: Recent Labs  Lab 02/04/18 0926  ALBUMIN 2.0*   No results for input(s): LIPASE, AMYLASE in the last 168 hours. No results for input(s): AMMONIA in the last 168 hours.  CBC: Recent Labs  Lab  02/02/18 0410 02/03/18 0524 02/04/18 0107 02/05/18 0413 02/06/18 0511  WBC 19.9* 16.5* 18.0* 21.0* 23.4*  HGB 9.0* 8.7* 8.3* 9.2* 9.1*  HCT 29.3* 28.8* 28.0* 30.8* 30.7*  MCV 97.3 99.0 99.3 99.7 99.4  PLT 170 159 173 197 224    Cardiac Enzymes: No results for input(s): CKTOTAL, CKMB, CKMBINDEX, TROPONINI in the last 168 hours.  BNP: Invalid input(s): POCBNP  CBG: Recent Labs  Lab 02/05/18 2038 02/06/18 0736 02/06/18 0841 02/06/18 0932 02/06/18 1234  GLUCAP 191* 168* 154* 135* 108*    Microbiology: Results for orders placed or performed during the hospital encounter of 01/29/18  Blood Culture (routine x 2)     Status: Abnormal   Collection Time: 01/29/18  9:55 PM  Result Value Ref Range Status   Specimen Description   Final    BLOOD RIGHT ANTECUBITAL Performed at [facility], [address], [place], [place] [address]    Special Requests   Final    BOTTLES DRAWN AEROBIC AND ANAEROBIC Blood Culture adequate volume Performed at [facility], [address], [place], [place] [address]    Culture  Setup Time   Final    GRAM POSITIVE COCCI IN CLUSTERS IN BOTH AEROBIC AND ANAEROBIC BOTTLES CRITICAL RESULT CALLED TO, READ  BACK BY AND VERIFIED WITH: KAREN HAYES 01/30/18 0839 JGF Performed at [facility], [address] [place], [place] [address]    Culture (A)  Final    STAPHYLOCOCCUS AUREUS SUSCEPTIBILITIES PERFORMED ON PREVIOUS CULTURE WITHIN THE LAST 5 DAYS. Performed at [facility], [place] [address], [place], [place] [address]    Report Status 02/01/2018 FINAL  Final  Blood Culture (routine x 2)     Status: Abnormal   Collection Time: 01/29/18  9:55 PM  Result Value Ref Range Status   Specimen Description   Final    BLOOD RIGHT ANTECUBITAL Performed at [facility], [address], [place], [place] [address]    Special Requests   Final    BOTTLES DRAWN AEROBIC AND ANAEROBIC Blood Culture adequate  volume Performed at [facility], [address] [place], [place] [address]    Culture  Setup Time   Final    GRAM POSITIVE COCCI IN CLUSTERS IN BOTH AEROBIC AND ANAEROBIC BOTTLES CRITICAL RESULT CALLED TO, READ BACK BY AND VERIFIED WITH: KAREN HAYES 01/30/18 0839 JGF Performed at [facility], [place] [address], [place], [place] [address]    Culture STAPHYLOCOCCUS AUREUS (A)  Final   Report Status 02/01/2018 FINAL  Final   Organism ID, Bacteria STAPHYLOCOCCUS AUREUS  Final      Susceptibility   Staphylococcus aureus - MIC*    CIPROFLOXACIN <=0.5 SENSITIVE Sensitive     ERYTHROMYCIN <=0.25 SENSITIVE Sensitive     GENTAMICIN <=0.5 SENSITIVE Sensitive     OXACILLIN 0.5 SENSITIVE Sensitive     TETRACYCLINE <=1 SENSITIVE Sensitive     VANCOMYCIN <=0.5 SENSITIVE Sensitive     TRIMETH/SULFA <=10 SENSITIVE Sensitive     CLINDAMYCIN <=0.25 SENSITIVE Sensitive     RIFAMPIN <=0.5 SENSITIVE Sensitive     Inducible Clindamycin NEGATIVE Sensitive     * STAPHYLOCOCCUS AUREUS  Blood Culture ID Panel (Reflexed)     Status: Abnormal   Collection Time: 01/29/18  9:55 PM  Result Value Ref Range Status   Enterococcus species NOT DETECTED NOT DETECTED Final   Vancomycin resistance NOT DETECTED NOT DETECTED Final   Listeria monocytogenes NOT DETECTED NOT DETECTED Final   Staphylococcus species DETECTED (A) NOT DETECTED Final    Comment: CRITICAL RESULT CALLED TO, READ BACK BY AND VERIFIED WITH: KAREN HAYES 01/30/18 0839 JGF    Staphylococcus aureus (BCID) DETECTED (A) NOT DETECTED Final    Comment: CRITICAL RESULT CALLED TO, READ BACK BY AND VERIFIED WITH: KAREN HAYES 01/30/18 0839 JGF Methicillin (oxacillin) susceptible Staphylococcus aureus (MSSA). Preferred therapy is anti staphylococcal beta lactam antibiotic (Cefazolin or Nafcillin), unless clinically contraindicated.    Methicillin resistance NOT DETECTED NOT DETECTED Final   Streptococcus species NOT DETECTED NOT DETECTED  Final   Streptococcus agalactiae NOT DETECTED NOT DETECTED Final   Streptococcus pneumoniae NOT DETECTED NOT DETECTED Final   Streptococcus pyogenes NOT DETECTED NOT DETECTED Final   Acinetobacter baumannii NOT DETECTED NOT DETECTED Final   Enterobacteriaceae species NOT DETECTED NOT DETECTED Final   Enterobacter cloacae complex NOT DETECTED NOT DETECTED Final   Escherichia coli NOT DETECTED NOT DETECTED Final   Klebsiella oxytoca NOT DETECTED NOT DETECTED Final   Klebsiella pneumoniae NOT DETECTED NOT DETECTED Final   Proteus species NOT DETECTED NOT DETECTED Final   Serratia marcescens NOT DETECTED NOT DETECTED Final   Carbapenem resistance NOT DETECTED NOT DETECTED Final   Haemophilus influenzae NOT DETECTED NOT DETECTED Final   Neisseria meningitidis NOT  DETECTED NOT DETECTED Final   Pseudomonas aeruginosa NOT DETECTED NOT DETECTED Final   Candida albicans NOT DETECTED NOT DETECTED Final   Candida glabrata NOT DETECTED NOT DETECTED Final   Candida krusei NOT DETECTED NOT DETECTED Final   Candida parapsilosis NOT DETECTED NOT DETECTED Final   Candida tropicalis NOT DETECTED NOT DETECTED Final    Comment: Performed at [facility], [address] [place], [place] [address]  MRSA PCR Screening     Status: None   Collection Time: 01/29/18 10:55 PM  Result Value Ref Range Status   MRSA by PCR NEGATIVE NEGATIVE Final    Comment:        The GeneXpert MRSA Assay (FDA approved for NASAL specimens only), is one component of a comprehensive MRSA colonization surveillance program. It is not intended to diagnose MRSA infection nor to guide or monitor treatment for MRSA infections. Performed at [facility], [address] [place], [place] [address]   Culture, blood (Routine X 2) w Reflex to ID Panel     Status: None   Collection Time: 01/30/18  9:27 AM  Result Value Ref Range Status   Specimen Description BLOOD RIGHT ANTECUBITAL  Final   Special Requests   Final     BOTTLES DRAWN AEROBIC AND ANAEROBIC Blood Culture adequate volume   Culture   Final    NO GROWTH 5 DAYS Performed at [facility], [address] [place], [place] [address]    Report Status 02/04/2018 FINAL  Final  MRSA PCR Screening     Status: None   Collection Time: 02/05/18 11:23 PM  Result Value Ref Range Status   MRSA by PCR NEGATIVE NEGATIVE Final    Comment:        The GeneXpert MRSA Assay (FDA approved for NASAL specimens only), is one component of a comprehensive MRSA colonization surveillance program. It is not intended to diagnose MRSA infection nor to guide or monitor treatment for MRSA infections. Performed at [facility], [address] [place],  [address]     Coagulation Studies: No results for input(s): LABPROT, INR in the last 72 hours.  Urinalysis: No results for input(s): COLORURINE, LABSPEC, PHURINE, GLUCOSEU, HGBUR, BILIRUBINUR, KETONESUR, PROTEINUR, UROBILINOGEN, NITRITE, LEUKOCYTESUR in the last 72 hours.  Invalid input(s): APPERANCEUR    Imaging: No results found.   Medications:   .  ceFAZolin (ANCEF) IV 0 mL/hr at 02/05/18 2240  . heparin Stopped (02/06/18 0744)   . acetaminophen  650 mg Oral Q8H  . aspirin EC  81 mg Oral Daily  . atorvastatin  80 mg Oral QHS  . Atropine Sulfate-NaCl  1 drop Ophthalmic BID  . carvedilol  3.125 mg Oral BID WC  . Chlorhexidine Gluconate Cloth  6 each Topical Q0600  . cholecalciferol  400 Units Oral Once per day on Mon Wed Fri  . docusate sodium  100 mg Oral BID  . epoetin (EPOGEN/PROCRIT) injection  4,000 Units Intravenous Q M,W,F-HD  . famotidine  20 mg Oral Daily  . feeding supplement (NEPRO CARB STEADY)  237 mL Oral BID BM  . insulin aspart  0-5 Units Subcutaneous QHS  . insulin aspart  0-9 Units Subcutaneous TID WC  . irbesartan  75 mg Oral Daily  . isosorbide mononitrate  30 mg Oral Daily  . multivitamin  1 tablet Oral QHS  . multivitamin with minerals  1 tablet  Oral Daily  . multivitamin-lutein  1 capsule Oral Daily   acetaminophen **OR** acetaminophen, bisacodyl,  HYDROmorphone (DILAUDID) injection, metoCLOPramide (REGLAN) injection, nitroGLYCERIN, ondansetron **OR** ondansetron (ZOFRAN) IV, oxyCODONE, traZODone  Assessment/ Plan:  Ms. Margit Batte is a 67 y.o. black female with end stage renal disease on peritoneal dialysis, hypertension, congestive heart failure, coronary artery disease status post CABG, diabetes mellitus type II insulin dependent, GERD, peripheral vascular disease admitted to [facility] on 01/29/2018 for Peripheral vascular disease ([place]) [I73.9] Right leg pain [M79.604] Left leg pain [M79.605] Ulcer of great toe, left, with unspecified severity [facility]) [facility] CCPD 10 hours 5 exchanges 2 liter fills   1. End Stage Renal Disease with hyperkalemia: missed several days of peritoneal dialysis.  Does not want to do PD anymore as outpatient.  Discussed with vascular surgery to remove PD catheter next week. - will continue Hemodialysis tentatively M-W-F - will need to be scheduled for outpatient hemodialysis -Patient could not complete her hemodialysis today due to catheter malfunction.  We will try again tomorrow as nursing staff noted swelling around operative site.  2.  Staphylococcus aureus bacteremia/Sepsis/ Left great toe osteomyelitis/endocarditis -PermCath has been removed Temporary dialysis catheter placed today then tunneled dialysis catheter sometime next week Antibiotics as per internal medicine team and ID team  Cefazolin for 6 weeks  2D echo shows moderate concentric LVH, grade 1 diastolic dysfunction, EF 50 to 55%, irregular thickening of the tricuspid valve with mobile echodensity suspicious for endocarditis.  TEE not done due to anesthesia risk.  3. Anemia of chronic kidney disease: hemoglobin 9.1 EPO with HD  4. Secondary Hyperparathyroidism:  - sevelamer with  meals.  - Hold for now because appetite is poor - Phons 4.3  5. Diabetes mellitus type II with chronic kidney disease: insulin dependent -  Continue glucose control   6. Peripheral vascular disease: left first toe with gangrene and pain.  - underwent limb salvage angioplasty on 01/30/18 -Left great toe amputation 02/06/2018     LOS: 8 Chaunda Vandergriff 10/18/20194:26 PM

## 2018-02-06 NOTE — Op Note (Signed)
Operative note   Surgeon:Nichoals Heyde Lawyer: None    Preop diagnosis: Gangrene left great toe    Postop diagnosis: Gangrene left great toe    Procedure: Amputation left great toe MTPJ    EBL: Minimal    Anesthesia:local and IV sedation    Hemostasis: None    Specimen: Gangrene left great toe and deep wound culture    Complications: None    Operative indications:Stephanie Ellis is an 67 y.o. that presents today for surgical intervention.  The risks/benefits/alternatives/complications have been discussed and consent has been given.    Procedure:  Patient was brought into the OR and placed on the operating table in thesupine position. After anesthesia was obtained theleft lower extremity was prepped and draped in usual sterile fashion.  Attention was directed to the left great toe where a dorsal and plantar elliptical incision was made distal to the metatarsophalangeal joint.  Full-thickness skin flaps were then created.  The incision was placed just proximal to the area of plantar necrotic tissue and dorsal necrotic tissue.  Full-thickness incision and skin flaps were created back to the metatarsophalangeal joint.  The toe was then disarticulated.  A scant amount of purulent drainage was noted at the initial incision.  The wound was flushed.  Deep wound cultures were taken.  Wound was continued irrigated.  There was minimal bleeding noted throughout the entire procedure.  No tourniquet or epinephrine was utilized.  Closure was performed with a 3-0 nylon.  The most lateral aspect of the incision was left open to allow drainage.  This was packed with gauze.  A bulky sterile dressing was then applied to the left foot and ankle.    Patient tolerated the procedure and anesthesia well.  Was transported from the OR to the PACU with all vital signs stable and vascular status intact.  Will return to the floor for further evaluation.

## 2018-02-06 NOTE — OR Nursing (Signed)
Pt arrived with active nose bleed,  O2 [place] removed. Pressure applied,  Dr Vickki Muff and Dr Randa Lynn aware.  Bleeding stopped prior to going to OR

## 2018-02-06 NOTE — Progress Notes (Signed)
[place] at [facility]                                                                                                                                                                                  Patient Demographics   Stephanie Ellis, is a 67 y.o. female, DOB - 11-20-1950, VQX:[id number]  Admit date - 01/29/2018   Admitting Physician Amelia Jo, MD  Outpatient Primary MD for the patient is System, Pcp Not In   LOS - 8  Subjective: Status post amputation of left great toe by podiatry today, had temporary dialysis catheter by vascular, getting dialysis now. Review of Systems:   CONSTITUTIONAL: No documented fever. No fatigue, weakness. No weight gain, no weight loss.  EYES: No blurry or double vision.  ENT: No tinnitus. No postnasal drip. No redness of the oropharynx.  RESPIRATORY: No cough, no wheeze, no hemoptysis. No dyspnea.  CARDIOVASCULAR: No chest pain. No orthopnea. No palpitations. No syncope.  GASTROINTESTINAL: No nausea or abdominal pain today.   GENITOURINARY: No dysuria or hematuria.  ENDOCRINE: No polyuria or nocturia. No heat or cold intolerance.  HEMATOLOGY: No anemia. No bruising. No bleeding.  INTEGUMENTARY: No rashes. No lesions.  MUSCULOSKELETAL: Left great toe amputation. NEUROLOGIC: No numbness, tingling, or ataxia. No seizure-type activity.  PSYCHIATRIC: No anxiety. No insomnia. No ADD.    Vitals:   Vitals:   02/06/18 0933 02/06/18 0939 02/06/18 0951 02/06/18 1148  BP:  (!) 123/49 (!) 127/47 (!) 121/43  Pulse:  71 70 65  Resp:  14 14 12   Temp:    98.6 F (37 C)  TempSrc:    Oral  SpO2: 97% 98% 96% 93%  Weight:      Height:        Wt Readings from Last 3 Encounters:  02/06/18 56.3 kg  10/15/17 58.1 kg  10/09/17 58.1 kg     Intake/Output Summary (Last 24 hours) at 02/06/2018 1325 Last data filed at 02/06/2018 0917 Gross per 24 hour  Intake 451.87 ml  Output 1 ml  Net 450.87 ml    Physical Exam:    GENERAL: Pleasant-appearing in no apparent distress.  HEAD, EYES, EARS, NOSE AND THROAT: Atraumatic, normocephalic. Extraocular muscles are intact. Pupils equal and reactive to light. Sclerae anicteric. No conjunctival injection. No oro-pharyngeal erythema.  NECK: Supple. There is no jugular venous distention. No bruits, no lymphadenopathy, no thyromegaly.  HEART: Regular rate and rhythm,. No murmurs, no rubs, no clicks.  LUNGS: Clear to auscultation bilaterally. No rales or rhonchi. No wheezes.  ABDOMEN: Soft, flat, nontender, nondistended. Has good bowel sounds. No hepatosplenomegaly appreciated.  EXTREMITIES: Status left great toe amputation.  NEUROLOGIC: The  patient is alert, awake, and oriented x3 with no focal motor or sensory deficits appreciated bilaterally.  SKIN: Moist and warm with no rashes appreciated.  Psych: Not anxious, depressed LN: No inguinal LN enlargement    Antibiotics   Anti-infectives (From admission, onward)   Start     Dose/Rate Route Frequency Ordered Stop   02/06/18 0832  ceFAZolin (ANCEF) 1-4 GM/50ML-% IVPB    Note to Pharmacy:  Norton Blizzard  : cabinet override      02/06/18 0263 02/06/18 0855   02/06/18 0831  vancomycin (VANCOCIN) 1-5 GM/200ML-% IVPB  Status:  Discontinued    Note to Pharmacy:  Norton Blizzard  : cabinet override      02/06/18 0831 02/06/18 0846   01/31/18 2200  ceFAZolin (ANCEF) IVPB 1 g/50 mL premix     1 g 100 mL/hr over 30 Minutes Intravenous Every 24 hours 01/31/18 1409     01/30/18 1800  vancomycin (VANCOCIN) IVPB 1000 mg/200 mL premix  Status:  Discontinued     1,000 mg 200 mL/hr over 60 Minutes Intravenous Every M-W-F (Hemodialysis) 01/30/18 0435 01/30/18 0906   01/30/18 1000  piperacillin-tazobactam (ZOSYN) IVPB 3.375 g  Status:  Discontinued     3.375 g 12.5 mL/hr over 240 Minutes Intravenous Every 12 hours 01/30/18 0435 01/30/18 0906   01/30/18 1000  ceFAZolin (ANCEF) 500 mg in dextrose 5 % 100 mL IVPB  Status:   Discontinued     500 mg 210 mL/hr over 30 Minutes Intravenous Every 12 hours 01/30/18 0906 01/31/18 1409   01/29/18 2200  piperacillin-tazobactam (ZOSYN) IVPB 3.375 g     3.375 g 100 mL/hr over 30 Minutes Intravenous  Once 01/29/18 2146 01/29/18 2242   01/29/18 2200  vancomycin (VANCOCIN) IVPB 1000 mg/200 mL premix     1,000 mg 200 mL/hr over 60 Minutes Intravenous  Once 01/29/18 2146 01/29/18 2314      Medications   Scheduled Meds: . acetaminophen  650 mg Oral Q8H  . aspirin EC  81 mg Oral Daily  . atorvastatin  80 mg Oral QHS  . Atropine Sulfate-NaCl  1 drop Ophthalmic BID  . carvedilol  3.125 mg Oral BID WC  . Chlorhexidine Gluconate Cloth  6 each Topical Q0600  . cholecalciferol  400 Units Oral Once per day on Mon Wed Fri  . docusate sodium  100 mg Oral BID  . epoetin (EPOGEN/PROCRIT) injection  4,000 Units Intravenous Q M,W,F-HD  . famotidine  20 mg Oral Daily  . feeding supplement (NEPRO CARB STEADY)  237 mL Oral BID BM  . insulin aspart  0-5 Units Subcutaneous QHS  . insulin aspart  0-9 Units Subcutaneous TID WC  . irbesartan  75 mg Oral Daily  . isosorbide mononitrate  30 mg Oral Daily  . multivitamin  1 tablet Oral QHS  . multivitamin with minerals  1 tablet Oral Daily  . multivitamin-lutein  1 capsule Oral Daily   Continuous Infusions: .  ceFAZolin (ANCEF) IV 0 mL/hr at 02/05/18 2240  . heparin Stopped (02/06/18 0744)   PRN Meds:.acetaminophen **OR** acetaminophen, bisacodyl, HYDROmorphone (DILAUDID) injection, metoCLOPramide (REGLAN) injection, nitroGLYCERIN, ondansetron **OR** ondansetron (ZOFRAN) IV, oxyCODONE, traZODone   Data Review:   Micro Results Recent Results (from the past 240 hour(s))  Blood Culture (routine x 2)     Status: Abnormal   Collection Time: 01/29/18  9:55 PM  Result Value Ref Range Status   Specimen Description   Final    BLOOD RIGHT ANTECUBITAL Performed at [facility]  Lab, [address] [place], [place] [address]     Special Requests   Final    BOTTLES DRAWN AEROBIC AND ANAEROBIC Blood Culture adequate volume Performed at [facility], [address] [place], [place] [address]    Culture  Setup Time   Final    GRAM POSITIVE COCCI IN CLUSTERS IN BOTH AEROBIC AND ANAEROBIC BOTTLES CRITICAL RESULT CALLED TO, READ BACK BY AND VERIFIED WITH: KAREN HAYES 01/30/18 0839 JGF Performed at [facility], [address] [place], [place] [address]    Culture (A)  Final    STAPHYLOCOCCUS AUREUS SUSCEPTIBILITIES PERFORMED ON PREVIOUS CULTURE WITHIN THE LAST 5 DAYS. Performed at [facility], [place] [address] [place], [place] [address]    Report Status 02/01/2018 FINAL  Final  Blood Culture (routine x 2)     Status: Abnormal   Collection Time: 01/29/18  9:55 PM  Result Value Ref Range Status   Specimen Description   Final    BLOOD RIGHT ANTECUBITAL Performed at [facility], [address], [place], [place] [address]    Special Requests   Final    BOTTLES DRAWN AEROBIC AND ANAEROBIC Blood Culture adequate volume Performed at [facility], [address] [place], [place] [address]    Culture  Setup Time   Final    GRAM POSITIVE COCCI IN CLUSTERS IN BOTH AEROBIC AND ANAEROBIC BOTTLES CRITICAL RESULT CALLED TO, READ BACK BY AND VERIFIED WITH: KAREN HAYES 01/30/18 0839 JGF Performed at [facility], [place] [address] [place], [place] [address]    Culture STAPHYLOCOCCUS AUREUS (A)  Final   Report Status 02/01/2018 FINAL  Final   Organism ID, Bacteria STAPHYLOCOCCUS AUREUS  Final      Susceptibility   Staphylococcus aureus - MIC*    CIPROFLOXACIN <=0.5 SENSITIVE Sensitive     ERYTHROMYCIN <=0.25 SENSITIVE Sensitive     GENTAMICIN <=0.5 SENSITIVE Sensitive     OXACILLIN 0.5 SENSITIVE Sensitive     TETRACYCLINE <=1 SENSITIVE Sensitive     VANCOMYCIN <=0.5 SENSITIVE Sensitive     TRIMETH/SULFA <=10 SENSITIVE Sensitive     CLINDAMYCIN <=0.25 SENSITIVE  Sensitive     RIFAMPIN <=0.5 SENSITIVE Sensitive     Inducible Clindamycin NEGATIVE Sensitive     * STAPHYLOCOCCUS AUREUS  Blood Culture ID Panel (Reflexed)     Status: Abnormal   Collection Time: 01/29/18  9:55 PM  Result Value Ref Range Status   Enterococcus species NOT DETECTED NOT DETECTED Final   Vancomycin resistance NOT DETECTED NOT DETECTED Final   Listeria monocytogenes NOT DETECTED NOT DETECTED Final   Staphylococcus species DETECTED (A) NOT DETECTED Final    Comment: CRITICAL RESULT CALLED TO, READ BACK BY AND VERIFIED WITH: KAREN HAYES 01/30/18 0839 JGF    Staphylococcus aureus (BCID) DETECTED (A) NOT DETECTED Final    Comment: CRITICAL RESULT CALLED TO, READ BACK BY AND VERIFIED WITH: KAREN HAYES 01/30/18 0839 JGF Methicillin (oxacillin) susceptible Staphylococcus aureus (MSSA). Preferred therapy is anti staphylococcal beta lactam antibiotic (Cefazolin or Nafcillin), unless clinically contraindicated.    Methicillin resistance NOT DETECTED NOT DETECTED Final   Streptococcus species NOT DETECTED NOT DETECTED Final   Streptococcus agalactiae NOT DETECTED NOT DETECTED Final   Streptococcus pneumoniae NOT DETECTED NOT DETECTED Final   Streptococcus pyogenes NOT DETECTED NOT DETECTED Final   Acinetobacter baumannii NOT DETECTED NOT DETECTED Final   Enterobacteriaceae species NOT DETECTED NOT DETECTED Final   Enterobacter cloacae complex NOT DETECTED NOT DETECTED  Final   Escherichia coli NOT DETECTED NOT DETECTED Final   Klebsiella oxytoca NOT DETECTED NOT DETECTED Final   Klebsiella pneumoniae NOT DETECTED NOT DETECTED Final   Proteus species NOT DETECTED NOT DETECTED Final   Serratia marcescens NOT DETECTED NOT DETECTED Final   Carbapenem resistance NOT DETECTED NOT DETECTED Final   Haemophilus influenzae NOT DETECTED NOT DETECTED Final   Neisseria meningitidis NOT DETECTED NOT DETECTED Final   Pseudomonas aeruginosa NOT DETECTED NOT DETECTED Final   Candida albicans NOT  DETECTED NOT DETECTED Final   Candida glabrata NOT DETECTED NOT DETECTED Final   Candida krusei NOT DETECTED NOT DETECTED Final   Candida parapsilosis NOT DETECTED NOT DETECTED Final   Candida tropicalis NOT DETECTED NOT DETECTED Final    Comment: Performed at [facility], [address] [place], [place] [address]  MRSA PCR Screening     Status: None   Collection Time: 01/29/18 10:55 PM  Result Value Ref Range Status   MRSA by PCR NEGATIVE NEGATIVE Final    Comment:        The GeneXpert MRSA Assay (FDA approved for NASAL specimens only), is one component of a comprehensive MRSA colonization surveillance program. It is not intended to diagnose MRSA infection nor to guide or monitor treatment for MRSA infections. Performed at [facility], [address] [place], [place] [address]   Culture, blood (Routine X 2) w Reflex to ID Panel     Status: None   Collection Time: 01/30/18  9:27 AM  Result Value Ref Range Status   Specimen Description BLOOD RIGHT ANTECUBITAL  Final   Special Requests   Final    BOTTLES DRAWN AEROBIC AND ANAEROBIC Blood Culture adequate volume   Culture   Final    NO GROWTH 5 DAYS Performed at [facility], [address] [place], [place] [address]    Report Status 02/04/2018 FINAL  Final  MRSA PCR Screening     Status: None   Collection Time: 02/05/18 11:23 PM  Result Value Ref Range Status   MRSA by PCR NEGATIVE NEGATIVE Final    Comment:        The GeneXpert MRSA Assay (FDA approved for NASAL specimens only), is one component of a comprehensive MRSA colonization surveillance program. It is not intended to diagnose MRSA infection nor to guide or monitor treatment for MRSA infections. Performed at [facility], [address] [place], [place] [address]     Radiology Reports Dg Tibia/fibula Left  Result Date: 01/29/2018 CLINICAL DATA:  Presents with ED via ems from home Having pain to both lower legs  But states left leg is worse Increased pain with palpation Decreased pulses to foot EXAM: LEFT TIBIA AND FIBULA - 2 VIEW COMPARISON:  None. FINDINGS: No fracture.  No bone lesion. Knee and ankle joints are normally aligned. There are arterial vascular calcifications. Soft tissues are otherwise unremarkable. IMPRESSION: Negative Electronically Signed   By: Lajean Manes M.D.   On: 01/29/2018 20:58   US Venous Img Lower Unilateral Left  Result Date: 01/29/2018 CLINICAL DATA:  Left leg pain EXAM: LEFT LOWER EXTREMITY VENOUS DOPPLER ULTRASOUND TECHNIQUE: Gray-scale sonography with graded compression, as well as color Doppler and duplex ultrasound were performed to evaluate the lower extremity deep venous systems from the level of the common femoral vein and including the common femoral, femoral, profunda femoral, popliteal and calf veins including the posterior tibial, peroneal and gastrocnemius veins when visible. The superficial great saphenous vein was  also interrogated. Spectral Doppler was utilized to evaluate flow at rest and with distal augmentation maneuvers in the common femoral, femoral and popliteal veins. COMPARISON:  None. FINDINGS: Contralateral Common Femoral Vein: Respiratory phasicity is normal and symmetric with the symptomatic side. No evidence of thrombus. Normal compressibility. Common Femoral Vein: No evidence of thrombus. Normal compressibility, respiratory phasicity and response to augmentation. Saphenofemoral Junction: No evidence of thrombus. Normal compressibility and flow on color Doppler imaging. Profunda Femoral Vein: No evidence of thrombus. Normal compressibility and flow on color Doppler imaging. Femoral Vein: No evidence of thrombus. Normal compressibility, respiratory phasicity and response to augmentation. Popliteal Vein: No evidence of thrombus. Normal compressibility, respiratory phasicity and response to augmentation. Calf Veins: No evidence of thrombus. Normal compressibility  and flow on color Doppler imaging. Superficial Great Saphenous Vein: No evidence of thrombus. Normal compressibility. Venous Reflux:  None. Other Findings:  None. IMPRESSION: No evidence of deep venous thrombosis. Electronically Signed   By: Rolm Baptise M.D.   On: 01/29/2018 18:36   Dg Toe Great Left  Result Date: 01/29/2018 CLINICAL DATA:  Open wound to the left great toe for several months. EXAM: LEFT GREAT TOE COMPARISON:  10/07/2017 FINDINGS: Degenerative changes in the first metatarsal-phalangeal joint. Soft tissue defect over the plantar aspect of the left first toe. There is underlying erosion of the distal phalangeal tuft cortex suggesting focal osteomyelitis. No evidence of acute fracture or dislocation. Diffuse bone demineralization. Prominent vascular calcifications. IMPRESSION: Soft tissue defect over the plantar aspect of the left first toe with underlying erosion of the distal phalangeal tuft suggesting osteomyelitis. Electronically Signed   By: Lucienne Capers M.D.   On: 01/29/2018 21:30     CBC Recent Labs  Lab 02/02/18 0410 02/03/18 0524 02/04/18 0107 02/05/18 0413 02/06/18 0511  WBC 19.9* 16.5* 18.0* 21.0* 23.4*  HGB 9.0* 8.7* 8.3* 9.2* 9.1*  HCT 29.3* 28.8* 28.0* 30.8* 30.7*  PLT 170 159 173 197 224  MCV 97.3 99.0 99.3 99.7 99.4  MCH 29.9 29.9 29.4 29.8 29.4  MCHC 30.7 30.2 29.6* 29.9* 29.6*  RDW 19.3* 19.7* 19.7* 19.9* 20.4*    Chemistries  Recent Labs  Lab 01/30/18 1345 02/04/18 0926 02/06/18 0511  NA 139 135  --   K 3.2* 4.5 5.3*  CL 98 97*  --   CO2 27 26  --   GLUCOSE 122* 131*  --   BUN 35* 50*  --   CREATININE 6.72* 9.10*  --   CALCIUM 8.2* 8.3*  --    ------------------------------------------------------------------------------------------------------------------ estimated creatinine clearance is 5.3 mL/min (A) (by C-G formula based on SCr of 9.1 mg/dL  (H)). ------------------------------------------------------------------------------------------------------------------ No results for input(s): HGBA1C in the last 72 hours. ------------------------------------------------------------------------------------------------------------------ No results for input(s): CHOL, HDL, LDLCALC, TRIG, CHOLHDL, LDLDIRECT in the last 72 hours. ------------------------------------------------------------------------------------------------------------------ No results for input(s): TSH, T4TOTAL, T3FREE, THYROIDAB in the last 72 hours.  Invalid input(s): FREET3 ------------------------------------------------------------------------------------------------------------------ No results for input(s): VITAMINB12, FOLATE, FERRITIN, TIBC, IRON, RETICCTPCT in the last 72 hours.  Coagulation profile No results for input(s): INR, PROTIME in the last 168 hours.  No results for input(s): DDIMER in the last 72 hours.  Cardiac Enzymes No results for input(s): CKMB, TROPONINI, MYOGLOBIN in the last 168 hours.  Invalid input(s): CK ------------------------------------------------------------------------------------------------------------------ Invalid input(s): [place]  Patient admitted with sepsis   1.  Sepsis,  with MSSA with left great toe osteomyelitis.    Status post removal of her permacath by vascular yesterday, has temporary dialysis  catheter now, repeat blood cultures from 10/11 has been negative so far.,  Acute ischemia of the left foot status post angioplasty of left popliteal by vascular , continue heparin drip, continue antibiotics for left great toe osteomyelitis,  Acute left great gangrene, status post amputation by podiatry today  2.    Acute encephalopathy due to #1 now improved, overnight events reviewed. .   3.  PAD.,  Left leg ischemia, status post hospitalization, continue aspirin, heparin drip, statins  vascular  surgery recommends Eliquis.  Hold Eliquis, likely permacath placement by Monday   4.  End-stage renal disease on peritoneal dialysis.    Nephrology has been notified, patient does not want peritoneal dialysis anymore, continue hemodialysis through right IJ permacath Monday, Wednesday, Friday.   5.  Diabetes type 2.  controlled, continue insulin with sliding scale coverage.  During the hospital stay.  Continue carb controlled renal diet. 6. CHF, currently clinically compensated, continue maintenance therapy.  Continue Coreg, Imdur, midodrine. 7.  Peripheral neuropathy, continue pain medication. #8. nausea likely secondary to sepsis/narcotic induced: And possible diabetic gastroparesis: N nausea again today, continue Reglan, PPI  #.9. hyperkalemia improved,    Code Status Orders  (From admission, onward)         Start     Ordered   01/29/18 2250  Full code  Continuous     01/29/18 2250        Code Status History    Date Active Date Inactive Code Status Order ID Comments User Context   10/24/2016 1055 10/25/2016 2243 Full Code [id number]  Hillary Bow, MD ED   10/17/2016 2143 10/19/2016 1440 Full Code [id number]  Vaughan Basta, MD Inpatient   09/04/2016 1708 09/05/2016 1500 Full Code [id number]  Epifanio Lesches, MD ED   08/22/2016 0938 08/22/2016 1833 Full Code [id number]  Algernon Huxley, MD Inpatient   06/27/2016 1121 06/27/2016 1846 Full Code [id number]  Algernon Huxley, MD Inpatient   10/03/2015 2235 10/04/2015 1724 Full Code [id number]  Harrie Foreman, MD Inpatient           Consults  NEPHROLOGY AND VASCULAR SURGERY   HEPRIN  Lab Results  Component Value Date   PLT 224 02/06/2018   High risk for cardiac arrest secondary to multiple medical problems. Time Spent in minutes 38MIN   Greater than 50% of time spent in care coordination and counseling patient regarding the condition and plan of care. disCussed with patient's daughters today.  Epifanio Lesches M.D on 02/06/2018  at 1:25 PM  Between 7am to 6pm - Pager - [phone]  After 6pm go to [email or web address] - Proofreader  [employer]   Office  [phone]

## 2018-02-07 LAB — CBC
HCT: 29.4 % — ABNORMAL LOW (ref 36.0–46.0)
Hemoglobin: 8.8 g/dL — ABNORMAL LOW (ref 12.0–15.0)
MCH: 29.8 pg (ref 26.0–34.0)
MCHC: 29.9 g/dL — ABNORMAL LOW (ref 30.0–36.0)
MCV: 99.7 fL (ref 80.0–100.0)
Platelets: 238 10*3/uL (ref 150–400)
RBC: 2.95 MIL/uL — ABNORMAL LOW (ref 3.87–5.11)
RDW: 20.8 % — ABNORMAL HIGH (ref 11.5–15.5)
WBC: 27.8 10*3/uL — ABNORMAL HIGH (ref 4.0–10.5)
nRBC: 0 % (ref 0.0–0.2)

## 2018-02-07 LAB — BASIC METABOLIC PANEL
Anion gap: 19 — ABNORMAL HIGH (ref 5–15)
BUN: 62 mg/dL — ABNORMAL HIGH (ref 8–23)
CO2: 26 mmol/L (ref 22–32)
Calcium: 8.6 mg/dL — ABNORMAL LOW (ref 8.9–10.3)
Chloride: 90 mmol/L — ABNORMAL LOW (ref 98–111)
Creatinine, Ser: 9.29 mg/dL — ABNORMAL HIGH (ref 0.44–1.00)
GFR calc Af Amer: 4 mL/min — ABNORMAL LOW (ref >60)
GFR calc non Af Amer: 4 mL/min — ABNORMAL LOW (ref >60)
Glucose, Bld: 187 mg/dL — ABNORMAL HIGH (ref 70–99)
Potassium: 5.5 mmol/L — ABNORMAL HIGH (ref 3.5–5.1)
Sodium: 135 mmol/L (ref 135–145)

## 2018-02-07 LAB — HEPARIN LEVEL (UNFRACTIONATED): Heparin Unfractionated: <0.1 IU/mL — ABNORMAL LOW (ref 0.30–0.70)

## 2018-02-07 LAB — GLUCOSE, CAPILLARY
Glucose-Capillary: 179 mg/dL — ABNORMAL HIGH (ref 70–99)
Glucose-Capillary: 158 mg/dL — ABNORMAL HIGH (ref 70–99)
Glucose-Capillary: 138 mg/dL — ABNORMAL HIGH (ref 70–99)
Glucose-Capillary: 266 mg/dL — ABNORMAL HIGH (ref 70–99)

## 2018-02-07 MED ORDER — BISACODYL 5 MG PO TBEC: 10.0000 mg | DELAYED_RELEASE_TABLET | Freq: Every day | ORAL | Status: DC | PRN

## 2018-02-07 NOTE — Progress Notes (Signed)
Daily Progress Note   Subjective  - 1 Day Post-Op  F/u great toe amputation  Objective Vitals:   02/06/18 1500 02/06/18 1658 02/06/18 2010 02/07/18 0536  BP: (!) 114/47 (!) 102/48 (!) 119/42 (!) 157/52  Pulse: 67 65 65 66  Resp: 18  16 17   Temp:   98.5 F (36.9 C) 97.7 F (36.5 C)  TempSrc:   Oral Oral  SpO2: 91%  94% 96%  Weight:      Height:        Physical Exam: Wound stable.  No purulence.  Plantar flap at amputation site is stable and well perfused.    Laboratory CBC    Component Value Date/Time   WBC 27.8 (H) 02/07/2018 0545   HGB 8.8 (L) 02/07/2018 0545   HCT 29.4 (L) 02/07/2018 0545   PLT 238 02/07/2018 0545    BMET    Component Value Date/Time   NA 135 02/07/2018 0545   K 5.5 (H) 02/07/2018 0545   CL 90 (L) 02/07/2018 0545   CO2 26 02/07/2018 0545   GLUCOSE 187 (H) 02/07/2018 0545   BUN 62 (H) 02/07/2018 0545   CREATININE 9.29 (H) 02/07/2018 0545   CALCIUM 8.6 (L) 02/07/2018 0545   GFRNONAA 4 (L) 02/07/2018 0545   GFRAA 4 (L) 02/07/2018 0545    Assessment/Planning: Gangrene left hallux s/p amputation   Dressing changed  OK for transfer to chair with assistance. Shoe ordered.  Dressing can be changed if needed.  Will f/u Tuesday if still in house.  If d/c'd dry dressing changed 3 x's per week. F/u with podiatry in 2 weeks.    Samara Deist A  02/07/2018, 9:55 AM

## 2018-02-07 NOTE — Progress Notes (Signed)
Pre dialysis assessment

## 2018-02-07 NOTE — Progress Notes (Signed)
[facility]  ROUNDING NOTE   Subjective:   Seen and examined on hemodialysis. Has a right temp femoral catheter placed yesterday.  When started on hemodialysis, hypotensive. However now with no UF and BFR of 350, tolerating treatment.  Placed on supplemental oxygen.   Objective:  Vital signs in last 24 hours:  Temp:  [97.7 F (36.5 C)-99 F (37.2 C)] 98.5 F (36.9 C) (10/19 1100) Pulse Rate:  [65-74] 73 (10/19 1145) Resp:  [14-24] 18 (10/19 1145) BP: (89-157)/(42-63) 109/44 (10/19 1145) SpO2:  [85 %-97 %] 86 % (10/19 1100) Weight:  [56.8 kg] 56.8 kg (10/18 1420)  Weight change: 0.509 kg Filed Weights   02/05/18 0500 02/06/18 0425 02/06/18 1420  Weight: 55.2 kg 56.3 kg 56.8 kg    Intake/Output: I/O last 3 completed shifts: In: 273.1 [I.V.:173.1; IV Piggyback:100] Out: -44 [Blood:1]   Intake/Output this shift:  Total I/O In: 52.4 [IV Piggyback:52.4] Out: -   Physical Exam: General: NAD, laying in bed, chronically ill-appearing  Head: Normocephalic, atraumatic. Moist oral mucosal membranes  Eyes: Anicteric   Neck: Supple,    Lungs:  Clear to auscultation  Heart: Regular rate and rhythm  Abdomen:  Soft, nontender,   Extremities:  Foot in bandages  Neurologic: Alert, oreinted  Access: PD catheter, right femoral temp catheter Dr. Delana Meyer    Basic Metabolic Panel: Recent Labs  Lab 02/02/18 0410 02/04/18 0926 02/06/18 0511 02/07/18 0545  NA  --  135  --  135  K  --  4.5 5.3* 5.5*  CL  --  97*  --  90*  CO2  --  26  --  26  GLUCOSE  --  131*  --  187*  BUN  --  50*  --  62*  CREATININE  --  9.10*  --  9.29*  CALCIUM  --  8.3*  --  8.6*  PHOS 9.1* 4.3  --   --     Liver Function Tests: Recent Labs  Lab 02/04/18 0926  ALBUMIN 2.0*   No results for input(s): LIPASE, AMYLASE in the last 168 hours. No results for input(s): AMMONIA in the last 168 hours.  CBC: Recent Labs  Lab 02/03/18 0524 02/04/18 0107 02/05/18 0413 02/06/18 0511  02/07/18 0545  WBC 16.5* 18.0* 21.0* 23.4* 27.8*  HGB 8.7* 8.3* 9.2* 9.1* 8.8*  HCT 28.8* 28.0* 30.8* 30.7* 29.4*  MCV 99.0 99.3 99.7 99.4 99.7  PLT 159 173 197 224 238    Cardiac Enzymes: No results for input(s): CKTOTAL, CKMB, CKMBINDEX, TROPONINI in the last 168 hours.  BNP: Invalid input(s): POCBNP  CBG: Recent Labs  Lab 02/06/18 0932 02/06/18 1234 02/06/18 1643 02/06/18 2131 02/07/18 0744  GLUCAP 135* 108* 146* 118* 179*    Microbiology: Results for orders placed or performed during the hospital encounter of 01/29/18  Blood Culture (routine x 2)     Status: Abnormal   Collection Time: 01/29/18  9:55 PM  Result Value Ref Range Status   Specimen Description   Final    BLOOD RIGHT ANTECUBITAL Performed at [facility], [address], [place], [place] [address]    Special Requests   Final    BOTTLES DRAWN AEROBIC AND ANAEROBIC Blood Culture adequate volume Performed at [facility], [address] [place], [place] [address]    Culture  Setup Time   Final    GRAM POSITIVE COCCI IN CLUSTERS IN BOTH AEROBIC AND ANAEROBIC BOTTLES CRITICAL RESULT CALLED TO, READ BACK BY AND  VERIFIED WITH: Charlane Ferretti 01/30/18 0071 JGF Performed at [facility], [address] [place], [place] [address]    Culture (A)  Final    STAPHYLOCOCCUS AUREUS SUSCEPTIBILITIES PERFORMED ON PREVIOUS CULTURE WITHIN THE LAST 5 DAYS. Performed at [facility], [place] [address], [place], [place] [address]    Report Status 02/01/2018 FINAL  Final  Blood Culture (routine x 2)     Status: Abnormal   Collection Time: 01/29/18  9:55 PM  Result Value Ref Range Status   Specimen Description   Final    BLOOD RIGHT ANTECUBITAL Performed at [facility], [address], [place], [place] [address]    Special Requests   Final    BOTTLES DRAWN AEROBIC AND ANAEROBIC Blood Culture adequate volume Performed at [facility], [address]  [place], [place] [address]    Culture  Setup Time   Final    GRAM POSITIVE COCCI IN CLUSTERS IN BOTH AEROBIC AND ANAEROBIC BOTTLES CRITICAL RESULT CALLED TO, READ BACK BY AND VERIFIED WITH: KAREN HAYES 01/30/18 0839 JGF Performed at [facility], [place] [address], [place], [place] [address]    Culture STAPHYLOCOCCUS AUREUS (A)  Final   Report Status 02/01/2018 FINAL  Final   Organism ID, Bacteria STAPHYLOCOCCUS AUREUS  Final      Susceptibility   Staphylococcus aureus - MIC*    CIPROFLOXACIN <=0.5 SENSITIVE Sensitive     ERYTHROMYCIN <=0.25 SENSITIVE Sensitive     GENTAMICIN <=0.5 SENSITIVE Sensitive     OXACILLIN 0.5 SENSITIVE Sensitive     TETRACYCLINE <=1 SENSITIVE Sensitive     VANCOMYCIN <=0.5 SENSITIVE Sensitive     TRIMETH/SULFA <=10 SENSITIVE Sensitive     CLINDAMYCIN <=0.25 SENSITIVE Sensitive     RIFAMPIN <=0.5 SENSITIVE Sensitive     Inducible Clindamycin NEGATIVE Sensitive     * STAPHYLOCOCCUS AUREUS  Blood Culture ID Panel (Reflexed)     Status: Abnormal   Collection Time: 01/29/18  9:55 PM  Result Value Ref Range Status   Enterococcus species NOT DETECTED NOT DETECTED Final   Vancomycin resistance NOT DETECTED NOT DETECTED Final   Listeria monocytogenes NOT DETECTED NOT DETECTED Final   Staphylococcus species DETECTED (A) NOT DETECTED Final    Comment: CRITICAL RESULT CALLED TO, READ BACK BY AND VERIFIED WITH: KAREN HAYES 01/30/18 0839 JGF    Staphylococcus aureus (BCID) DETECTED (A) NOT DETECTED Final    Comment: CRITICAL RESULT CALLED TO, READ BACK BY AND VERIFIED WITH: KAREN HAYES 01/30/18 0839 JGF Methicillin (oxacillin) susceptible Staphylococcus aureus (MSSA). Preferred therapy is anti staphylococcal beta lactam antibiotic (Cefazolin or Nafcillin), unless clinically contraindicated.    Methicillin resistance NOT DETECTED NOT DETECTED Final   Streptococcus species NOT DETECTED NOT DETECTED Final   Streptococcus agalactiae NOT DETECTED NOT DETECTED Final    Streptococcus pneumoniae NOT DETECTED NOT DETECTED Final   Streptococcus pyogenes NOT DETECTED NOT DETECTED Final   Acinetobacter baumannii NOT DETECTED NOT DETECTED Final   Enterobacteriaceae species NOT DETECTED NOT DETECTED Final   Enterobacter cloacae complex NOT DETECTED NOT DETECTED Final   Escherichia coli NOT DETECTED NOT DETECTED Final   Klebsiella oxytoca NOT DETECTED NOT DETECTED Final   Klebsiella pneumoniae NOT DETECTED NOT DETECTED Final   Proteus species NOT DETECTED NOT DETECTED Final   Serratia marcescens NOT DETECTED NOT DETECTED Final   Carbapenem resistance NOT DETECTED NOT DETECTED Final   Haemophilus influenzae NOT DETECTED NOT DETECTED Final   Neisseria meningitidis NOT DETECTED NOT DETECTED  Final   Pseudomonas aeruginosa NOT DETECTED NOT DETECTED Final   Candida albicans NOT DETECTED NOT DETECTED Final   Candida glabrata NOT DETECTED NOT DETECTED Final   Candida krusei NOT DETECTED NOT DETECTED Final   Candida parapsilosis NOT DETECTED NOT DETECTED Final   Candida tropicalis NOT DETECTED NOT DETECTED Final    Comment: Performed at [facility], [address] [place], [place] [address]  MRSA PCR Screening     Status: None   Collection Time: 01/29/18 10:55 PM  Result Value Ref Range Status   MRSA by PCR NEGATIVE NEGATIVE Final    Comment:        The GeneXpert MRSA Assay (FDA approved for NASAL specimens only), is one component of a comprehensive MRSA colonization surveillance program. It is not intended to diagnose MRSA infection nor to guide or monitor treatment for MRSA infections. Performed at [facility], [address] [place], [place] [address]   Culture, blood (Routine X 2) w Reflex to ID Panel     Status: None   Collection Time: 01/30/18  9:27 AM  Result Value Ref Range Status   Specimen Description BLOOD RIGHT ANTECUBITAL  Final   Special Requests   Final    BOTTLES DRAWN AEROBIC AND ANAEROBIC Blood Culture adequate  volume   Culture   Final    NO GROWTH 5 DAYS Performed at [facility], [address] [place], [place] [address]    Report Status 02/04/2018 FINAL  Final  MRSA PCR Screening     Status: None   Collection Time: 02/05/18 11:23 PM  Result Value Ref Range Status   MRSA by PCR NEGATIVE NEGATIVE Final    Comment:        The GeneXpert MRSA Assay (FDA approved for NASAL specimens only), is one component of a comprehensive MRSA colonization surveillance program. It is not intended to diagnose MRSA infection nor to guide or monitor treatment for MRSA infections. Performed at [facility], [address] [place], [place] [address]   Aerobic/Anaerobic Culture (surgical/deep wound)     Status: None (Preliminary result)   Collection Time: 02/06/18  9:11 AM  Result Value Ref Range Status   Specimen Description TOE GREAT LEFT  Final   Special Requests   Final    NONE Performed at [facility], [address], [place], [place] [address]    Gram Stain   Final    NO WBC SEEN NO ORGANISMS SEEN Performed at [facility], [place] [address], [place], [place] [address]    Culture PENDING  Incomplete   Report Status PENDING  Incomplete    Coagulation Studies: No results for input(s): LABPROT, INR in the last 72 hours.  Urinalysis: No results for input(s): COLORURINE, LABSPEC, PHURINE, GLUCOSEU, HGBUR, BILIRUBINUR, KETONESUR, PROTEINUR, UROBILINOGEN, NITRITE, LEUKOCYTESUR in the last 72 hours.  Invalid input(s): APPERANCEUR    Imaging: No results found.   Medications:   .  ceFAZolin (ANCEF) IV Stopped (02/06/18 2305)  . heparin Stopped (02/06/18 0744)   . acetaminophen  650 mg Oral Q8H  . aspirin EC  81 mg Oral Daily  . atorvastatin  80 mg Oral QHS  . Atropine Sulfate-NaCl  1 drop Ophthalmic BID  . carvedilol  3.125 mg Oral BID WC  . Chlorhexidine Gluconate Cloth  6 each Topical Q0600  . cholecalciferol  400 Units Oral Once per day on Mon Wed  Fri  . docusate sodium  100 mg Oral BID  .  epoetin (EPOGEN/PROCRIT) injection  4,000 Units Intravenous Q M,W,F-HD  . famotidine  20 mg Oral Daily  . feeding supplement (NEPRO CARB STEADY)  237 mL Oral BID BM  . insulin aspart  0-5 Units Subcutaneous QHS  . insulin aspart  0-9 Units Subcutaneous TID WC  . irbesartan  75 mg Oral Daily  . isosorbide mononitrate  30 mg Oral Daily  . multivitamin  1 tablet Oral QHS  . multivitamin with minerals  1 tablet Oral Daily  . multivitamin-lutein  1 capsule Oral Daily   acetaminophen **OR** acetaminophen, bisacodyl, HYDROmorphone (DILAUDID) injection, metoCLOPramide (REGLAN) injection, nitroGLYCERIN, ondansetron **OR** ondansetron (ZOFRAN) IV, oxyCODONE, traZODone  Assessment/ Plan:  [name] is a 67 y.o. black female with end stage renal disease on peritoneal dialysis, hypertension, congestive heart failure, coronary artery disease status post CABG, diabetes mellitus type II insulin dependent, GERD, peripheral vascular disease admitted to [facility] on 01/29/2018 for Peripheral vascular disease ([place]) [I73.9] Right leg pain [M79.604] Left leg pain [M79.605] Ulcer of great toe, left, with unspecified severity [facility]) [L97.529]   [facility] CCPD 10 hours 5 exchanges 2 liter fills  Transition to hemodialysis.   1. End Stage Renal Disease with hyperkalemia: 2K bath Seen and examined on hemodialysis.  Right temp HD catheter. Will need tunneled catheter before discharge.   2.  Staphylococcus aureus bacteremia/Sepsis/ Left great toe osteomyelitis/endocarditis - PermCath has been removed Temporary dialysis catheter placed on 10/18 - Cefazolin for 6 weeks  2D echo shows moderate concentric LVH, grade 1 diastolic dysfunction, EF 50 to 55%, irregular thickening of the tricuspid valve with mobile echodensity suspicious for endocarditis.  TEE not done due to anesthesia risk.  3. Anemia of chronic kidney disease:  hemoglobin 8.8 EPO with HD  4. Secondary Hyperparathyroidism:  - hodling binders due to poor appetite.   5. Hypertension: hypotension on treatment. Currently ordered carvedilol, irbesartan, isosorbide mononitrate - hold isosorbide mononitrate and carvedilol - monitor for hypotension, consider midodrine before treatment.   6. Peripheral vascular disease: left first toe with gangrene and pain.  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

## 2018-02-07 NOTE — Progress Notes (Signed)
Post dialysis assessment

## 2018-02-07 NOTE — Progress Notes (Signed)
Hd completed.

## 2018-02-07 NOTE — Progress Notes (Signed)
ANTICOAGULATION CONSULT NOTE - Follow Up Consult  Pharmacy Consult for Heparin Indication: arterial embolism  Allergies  Allergen Reactions  . Chlorthalidone Anaphylaxis, Itching and Rash  . Fentanyl Rash  . Midazolam Rash  . Ace Inhibitors Other (See Comments)    Reaction:  Hyperkalemia, agitation   . Angiotensin Receptor Blockers Other (See Comments)    Reaction:  Hyperkalemia, agitation   . Norvasc [Amlodipine] Itching and Rash  . Phenergan [Promethazine Hcl] Anxiety    "antsy, can't sit still"    Patient Measurements: Height: 5' 4"  (162.6 cm) Weight: 125 lb 3.5 oz (56.8 kg) IBW/kg (Calculated) : 54.7 Heparin Dosing Weight: 56.7 kg  Vital Signs: Temp: 97.7 F (36.5 C) (10/19 0536) Temp Source: Oral (10/19 0536) BP: 157/Stephanie (10/19 0536) Pulse Rate: 66 (10/19 0536)  Labs: Recent Labs    02/04/18 0926  02/05/18 0413 02/05/18 1243 02/06/18 0511 02/07/18 0545  HGB  --    < > 9.2*  --  9.1* 8.8*  HCT  --   --  30.8*  --  30.7* 29.4*  PLT  --   --  197  --  224 238  HEPARINUNFRC 1.08*  --  0.49 0.56 0.45 <0.10*  CREATININE 9.10*  --   --   --   --  9.29*   < > = values in this interval not displayed.    Estimated Creatinine Clearance: 5.1 mL/min (A) (by C-G formula based on SCr of 9.29 mg/dL (H)).  Assessment: Stephanie Ellis now s/p peripheral vascular catheterization with arterial embolism. Pharmacy consulted to dose heparin. No initial bolus due to receiving heparin 5000 units IV x 1 in angio suite per Dr. Delana Meyer. Hemoglobin is trending down following limb salvage angioplasty 10/11 but stabilizing.Epo ordered with HD  Heparin was started at 800 units/hr 10/12 0351 HL 0.47:  10/12 1307 HL 0.46 10/13 0324 HL 0.30: rate increased to 850 units/hr 10/13 1200 HL 0.Stephanie 10/13 1957 HL 0.60 10/14 0410 HL 0.59 10/15 AM    HL 0.85: decrease rate to 750 units/hr 10/15 1516 HL 0.81: decrease rate to 650 units/hr 10/16 0100 HL 0.54 10/16 0926 HL 1.01: question validity of value,  order placed for redraw but patient refused and then went to vascular lab  Goal of Therapy:  Heparin level 0.3-0.7 units/ml Monitor platelets by anticoagulation protocol: Yes   Plan:  After vascular procedure MD requested that Heparin be resumed at 19:00 at previous rate of 650 units/hr without a bolus. Will check HL 8 hours after restart.  10/17 AM heparin level 0.49. Recheck in 8 hours to confirm.  10/17 1243 heparin level 0.56.  Will maintain rate of 650 units/hr and check heparin level daily with morning labs.  10/18 AM heparin level 0.45. Continue current regimen. Recheck heparin level and CBC with tomorrow AM labs.  10/19 AM heparin level <0.1. Drip off for nosebleed.   Sim Boast, PharmD, BCPS  02/07/18 6:42 AM

## 2018-02-07 NOTE — Progress Notes (Signed)
Hd started

## 2018-02-07 NOTE — Progress Notes (Addendum)
[place] [facility]                                                                                                                                                                                  Patient Demographics   Stephanie Ellis, is a 67 y.o. female, DOB - [birth year], TDH:[id number]  Admit date - 01/29/2018   Admitting Physician Amelia Jo, MD  Outpatient Primary MD for the patient is System, Pcp Not In   LOS - 9  Subjective: Status post amputation of left great toe. some blood at temporary dialysis catheter site. Review of Systems:   CONSTITUTIONAL: No documented fever. No fatigue, weakness. No weight gain, no weight loss.  EYES: No blurry or double vision.  ENT: No tinnitus. No postnasal drip. No redness of the oropharynx.  RESPIRATORY: No cough, no wheeze, no hemoptysis. No dyspnea.  CARDIOVASCULAR: No chest pain. No orthopnea. No palpitations. No syncope.  GASTROINTESTINAL: No nausea or abdominal pain today.   GENITOURINARY: No dysuria or hematuria.  ENDOCRINE: No polyuria or nocturia. No heat or cold intolerance.  HEMATOLOGY: No anemia. No bruising. No bleeding.  INTEGUMENTARY: No rashes. No lesions.  MUSCULOSKELETAL: Left great toe amputation. NEUROLOGIC: No numbness, tingling, or ataxia. No seizure-type activity.  PSYCHIATRIC: No anxiety. No insomnia. No ADD.    Vitals:   Vitals:   02/07/18 1317 02/07/18 1330 02/07/18 1343 02/07/18 1400  BP: (!) 106/42 (!) 116/44 (!) 109/47 (!) 91/48  Pulse: 70 68 71 72  Resp: 18 17 18 17   Temp:    98.3 F (36.8 C)  TempSrc:    Oral  SpO2:      Weight:      Height:        Wt Readings from Last 3 Encounters:  02/06/18 56.8 kg  10/15/17 58.1 kg  10/09/17 58.1 kg     Intake/Output Summary (Last 24 hours) at 02/07/2018 1553 Last data filed at 02/07/2018 1402 Gross per 24 hour  Intake 113.68 ml  Output -804 ml  Net 917.68 ml    Physical Exam:   GENERAL: Pleasant-appearing in no  apparent distress.  HEAD, EYES, EARS, NOSE AND THROAT: Atraumatic, normocephalic. Extraocular muscles are intact. Pupils equal and reactive to light. Sclerae anicteric. No conjunctival injection. No oro-pharyngeal erythema.  NECK: Supple. There is no jugular venous distention. No bruits, no lymphadenopathy, no thyromegaly.  HEART: Regular rate and rhythm,. No murmurs, no rubs, no clicks.  LUNGS: Clear to auscultation bilaterally. No rales or rhonchi. No wheezes.  ABDOMEN: Soft, flat, nontender, nondistended. Has good bowel sounds. No hepatosplenomegaly appreciated.  EXTREMITIES: Status left great toe amputation. some blood at temporary dialysis catheter site.  NEUROLOGIC: The patient is alert, awake, and oriented x3 with no focal motor or sensory deficits appreciated bilaterally.  SKIN: Moist and warm with no rashes appreciated.  Psych: Not anxious, depressed LN: No inguinal LN enlargement    Antibiotics   Anti-infectives (From admission, onward)   Start     Dose/Rate Route Frequency Ordered Stop   02/06/18 0832  ceFAZolin (ANCEF) 1-4 GM/50ML-% IVPB    Note to Pharmacy:  Norton Blizzard  : cabinet override      02/06/18 5400 02/06/18 0855   02/06/18 0831  vancomycin (VANCOCIN) 1-5 GM/200ML-% IVPB  Status:  Discontinued    Note to Pharmacy:  Norton Blizzard  : cabinet override      02/06/18 0831 02/06/18 0846   01/31/18 2200  ceFAZolin (ANCEF) IVPB 1 g/50 mL premix     1 g 100 mL/hr over 30 Minutes Intravenous Every 24 hours 01/31/18 1409     01/30/18 1800  vancomycin (VANCOCIN) IVPB 1000 mg/200 mL premix  Status:  Discontinued     1,000 mg 200 mL/hr over 60 Minutes Intravenous Every M-W-F (Hemodialysis) 01/30/18 0435 01/30/18 0906   01/30/18 1000  piperacillin-tazobactam (ZOSYN) IVPB 3.375 g  Status:  Discontinued     3.375 g 12.5 mL/hr over 240 Minutes Intravenous Every 12 hours 01/30/18 0435 01/30/18 0906   01/30/18 1000  ceFAZolin (ANCEF) 500 mg in dextrose 5 % 100 mL IVPB   Status:  Discontinued     500 mg 210 mL/hr over 30 Minutes Intravenous Every 12 hours 01/30/18 0906 01/31/18 1409   01/29/18 2200  piperacillin-tazobactam (ZOSYN) IVPB 3.375 g     3.375 g 100 mL/hr over 30 Minutes Intravenous  Once 01/29/18 2146 01/29/18 2242   01/29/18 2200  vancomycin (VANCOCIN) IVPB 1000 mg/200 mL premix     1,000 mg 200 mL/hr over 60 Minutes Intravenous  Once 01/29/18 2146 01/29/18 2314      Medications   Scheduled Meds: . acetaminophen  650 mg Oral Q8H  . aspirin EC  81 mg Oral Daily  . atorvastatin  80 mg Oral QHS  . Atropine Sulfate-NaCl  1 drop Ophthalmic BID  . carvedilol  3.125 mg Oral BID WC  . Chlorhexidine Gluconate Cloth  6 each Topical Q0600  . cholecalciferol  400 Units Oral Once per day on Mon Wed Fri  . docusate sodium  100 mg Oral BID  . epoetin (EPOGEN/PROCRIT) injection  4,000 Units Intravenous Q M,W,F-HD  . famotidine  20 mg Oral Daily  . feeding supplement (NEPRO CARB STEADY)  237 mL Oral BID BM  . insulin aspart  0-5 Units Subcutaneous QHS  . insulin aspart  0-9 Units Subcutaneous TID WC  . irbesartan  75 mg Oral Daily  . isosorbide mononitrate  30 mg Oral Daily  . multivitamin  1 tablet Oral QHS  . multivitamin with minerals  1 tablet Oral Daily  . multivitamin-lutein  1 capsule Oral Daily   Continuous Infusions: .  ceFAZolin (ANCEF) IV Stopped (02/06/18 2305)  . heparin Stopped (02/06/18 0744)   PRN Meds:.acetaminophen **OR** acetaminophen, bisacodyl, HYDROmorphone (DILAUDID) injection, metoCLOPramide (REGLAN) injection, nitroGLYCERIN, ondansetron **OR** ondansetron (ZOFRAN) IV, oxyCODONE, traZODone   Data Review:   Micro Results Recent Results (from the past 240 hour(s))  Blood Culture (routine x 2)     Status: Abnormal   Collection Time: 01/29/18  9:55 PM  Result Value Ref Range Status   Specimen Description   Final    BLOOD RIGHT ANTECUBITAL Performed at [facility], [address], [place], [place] [address]     Special Requests   Final    BOTTLES DRAWN AEROBIC AND ANAEROBIC Blood Culture adequate volume Performed at [facility] [address] [place], [place] [address]    Culture  Setup Time   Final    GRAM POSITIVE COCCI IN CLUSTERS IN BOTH AEROBIC AND ANAEROBIC BOTTLES CRITICAL RESULT CALLED TO, READ BACK BY AND VERIFIED WITH: KAREN HAYES 01/30/18 0839 JGF Performed at [facility], [address] [place], [place] [address]    Culture (A)  Final    STAPHYLOCOCCUS AUREUS SUSCEPTIBILITIES PERFORMED ON PREVIOUS CULTURE WITHIN THE LAST 5 DAYS. Performed at [facility], [place] [address] [place], [place] [address]    Report Status 02/01/2018 FINAL  Final  Blood Culture (routine x 2)     Status: Abnormal   Collection Time: 01/29/18  9:55 PM  Result Value Ref Range Status   Specimen Description   Final    BLOOD RIGHT ANTECUBITAL Performed at [facility], [address], [place], [place] [address]    Special Requests   Final    BOTTLES DRAWN AEROBIC AND ANAEROBIC Blood Culture adequate volume Performed at [facility], [address] [place], [place] [address]    Culture  Setup Time   Final    GRAM POSITIVE COCCI IN CLUSTERS IN BOTH AEROBIC AND ANAEROBIC BOTTLES CRITICAL RESULT CALLED TO, READ BACK BY AND VERIFIED WITH: KAREN HAYES 01/30/18 0839 JGF Performed at [facility], [place] [address] [place], [place] [address]    Culture STAPHYLOCOCCUS AUREUS (A)  Final   Report Status 02/01/2018 FINAL  Final   Organism ID, Bacteria STAPHYLOCOCCUS AUREUS  Final      Susceptibility   Staphylococcus aureus - MIC*    CIPROFLOXACIN <=0.5 SENSITIVE Sensitive     ERYTHROMYCIN <=0.25 SENSITIVE Sensitive     GENTAMICIN <=0.5 SENSITIVE Sensitive     OXACILLIN 0.5 SENSITIVE Sensitive     TETRACYCLINE <=1 SENSITIVE Sensitive     VANCOMYCIN <=0.5 SENSITIVE Sensitive     TRIMETH/SULFA <=10 SENSITIVE Sensitive     CLINDAMYCIN <=0.25 SENSITIVE  Sensitive     RIFAMPIN <=0.5 SENSITIVE Sensitive     Inducible Clindamycin NEGATIVE Sensitive     * STAPHYLOCOCCUS AUREUS  Blood Culture ID Panel (Reflexed)     Status: Abnormal   Collection Time: 01/29/18  9:55 PM  Result Value Ref Range Status   Enterococcus species NOT DETECTED NOT DETECTED Final   Vancomycin resistance NOT DETECTED NOT DETECTED Final   Listeria monocytogenes NOT DETECTED NOT DETECTED Final   Staphylococcus species DETECTED (A) NOT DETECTED Final    Comment: CRITICAL RESULT CALLED TO, READ BACK BY AND VERIFIED WITH: KAREN HAYES 01/30/18 0839 JGF    Staphylococcus aureus (BCID) DETECTED (A) NOT DETECTED Final    Comment: CRITICAL RESULT CALLED TO, READ BACK BY AND VERIFIED WITH: KAREN HAYES 01/30/18 0839 JGF Methicillin (oxacillin) susceptible Staphylococcus aureus (MSSA). Preferred therapy is anti staphylococcal beta lactam antibiotic (Cefazolin or Nafcillin), unless clinically contraindicated.    Methicillin resistance NOT DETECTED NOT DETECTED Final   Streptococcus species NOT DETECTED NOT DETECTED Final   Streptococcus agalactiae NOT DETECTED NOT DETECTED Final   Streptococcus pneumoniae NOT DETECTED NOT DETECTED Final   Streptococcus pyogenes NOT DETECTED NOT DETECTED Final   Acinetobacter baumannii NOT DETECTED NOT DETECTED Final   Enterobacteriaceae species NOT DETECTED NOT DETECTED Final   Enterobacter cloacae complex NOT DETECTED NOT DETECTED  Final   Escherichia coli NOT DETECTED NOT DETECTED Final   Klebsiella oxytoca NOT DETECTED NOT DETECTED Final   Klebsiella pneumoniae NOT DETECTED NOT DETECTED Final   Proteus species NOT DETECTED NOT DETECTED Final   Serratia marcescens NOT DETECTED NOT DETECTED Final   Carbapenem resistance NOT DETECTED NOT DETECTED Final   Haemophilus influenzae NOT DETECTED NOT DETECTED Final   Neisseria meningitidis NOT DETECTED NOT DETECTED Final   Pseudomonas aeruginosa NOT DETECTED NOT DETECTED Final   Candida albicans NOT  DETECTED NOT DETECTED Final   Candida glabrata NOT DETECTED NOT DETECTED Final   Candida krusei NOT DETECTED NOT DETECTED Final   Candida parapsilosis NOT DETECTED NOT DETECTED Final   Candida tropicalis NOT DETECTED NOT DETECTED Final    Comment: Performed at [facility], [address] [place], [place] [address]  MRSA PCR Screening     Status: None   Collection Time: 01/29/18 10:55 PM  Result Value Ref Range Status   MRSA by PCR NEGATIVE NEGATIVE Final    Comment:        The GeneXpert MRSA Assay (FDA approved for NASAL specimens only), is one component of a comprehensive MRSA colonization surveillance program. It is not intended to diagnose MRSA infection nor to guide or monitor treatment for MRSA infections. Performed at [facility], [address] [place], [place] [address]   Culture, blood (Routine X 2) w Reflex to ID Panel     Status: None   Collection Time: 01/30/18  9:27 AM  Result Value Ref Range Status   Specimen Description BLOOD RIGHT ANTECUBITAL  Final   Special Requests   Final    BOTTLES DRAWN AEROBIC AND ANAEROBIC Blood Culture adequate volume   Culture   Final    NO GROWTH 5 DAYS Performed at [facility], [address] [place], [place] [address]    Report Status 02/04/2018 FINAL  Final  MRSA PCR Screening     Status: None   Collection Time: 02/05/18 11:23 PM  Result Value Ref Range Status   MRSA by PCR NEGATIVE NEGATIVE Final    Comment:        The GeneXpert MRSA Assay (FDA approved for NASAL specimens only), is one component of a comprehensive MRSA colonization surveillance program. It is not intended to diagnose MRSA infection nor to guide or monitor treatment for MRSA infections. Performed at [facility], [address] [place], [place] [address]   Aerobic/Anaerobic Culture (surgical/deep wound)     Status: None (Preliminary result)   Collection Time: 02/06/18  9:11 AM  Result Value Ref Range  Status   Specimen Description TOE GREAT LEFT  Final   Special Requests   Final    NONE Performed at [facility], [address] [place], [place] [address]    Gram Stain   Final    NO WBC SEEN NO ORGANISMS SEEN Performed at [facility], [place] [address], [place], [place] [address]    Culture RARE STAPHYLOCOCCUS AUREUS  Final   Report Status PENDING  Incomplete    Radiology Reports Dg Tibia/fibula Left  Result Date: 01/29/2018 CLINICAL DATA:  Presents with ED via ems from home Having pain to both lower legs But states left leg is worse Increased pain with palpation Decreased pulses to foot EXAM: LEFT TIBIA AND FIBULA - 2 VIEW COMPARISON:  None. FINDINGS: No fracture.  No bone lesion. Knee and ankle joints are normally aligned. There are arterial vascular calcifications. Soft  tissues are otherwise unremarkable. IMPRESSION: Negative Electronically Signed   By: Lajean Manes M.D.   On: 01/29/2018 20:58   US Venous Img Lower Unilateral Left  Result Date: 01/29/2018 CLINICAL DATA:  Left leg pain EXAM: LEFT LOWER EXTREMITY VENOUS DOPPLER ULTRASOUND TECHNIQUE: Gray-scale sonography with graded compression, as well as color Doppler and duplex ultrasound were performed to evaluate the lower extremity deep venous systems from the level of the common femoral vein and including the common femoral, femoral, profunda femoral, popliteal and calf veins including the posterior tibial, peroneal and gastrocnemius veins when visible. The superficial great saphenous vein was also interrogated. Spectral Doppler was utilized to evaluate flow at rest and with distal augmentation maneuvers in the common femoral, femoral and popliteal veins. COMPARISON:  None. FINDINGS: Contralateral Common Femoral Vein: Respiratory phasicity is normal and symmetric with the symptomatic side. No evidence of thrombus. Normal compressibility. Common Femoral Vein: No evidence of thrombus. Normal compressibility,  respiratory phasicity and response to augmentation. Saphenofemoral Junction: No evidence of thrombus. Normal compressibility and flow on color Doppler imaging. Profunda Femoral Vein: No evidence of thrombus. Normal compressibility and flow on color Doppler imaging. Femoral Vein: No evidence of thrombus. Normal compressibility, respiratory phasicity and response to augmentation. Popliteal Vein: No evidence of thrombus. Normal compressibility, respiratory phasicity and response to augmentation. Calf Veins: No evidence of thrombus. Normal compressibility and flow on color Doppler imaging. Superficial Great Saphenous Vein: No evidence of thrombus. Normal compressibility. Venous Reflux:  None. Other Findings:  None. IMPRESSION: No evidence of deep venous thrombosis. Electronically Signed   By: Rolm Baptise M.D.   On: 01/29/2018 18:36   Dg Toe Great Left  Result Date: 01/29/2018 CLINICAL DATA:  Open wound to the left great toe for several months. EXAM: LEFT GREAT TOE COMPARISON:  10/07/2017 FINDINGS: Degenerative changes in the first metatarsal-phalangeal joint. Soft tissue defect over the plantar aspect of the left first toe. There is underlying erosion of the distal phalangeal tuft cortex suggesting focal osteomyelitis. No evidence of acute fracture or dislocation. Diffuse bone demineralization. Prominent vascular calcifications. IMPRESSION: Soft tissue defect over the plantar aspect of the left first toe with underlying erosion of the distal phalangeal tuft suggesting osteomyelitis. Electronically Signed   By: Lucienne Capers M.D.   On: 01/29/2018 21:30     CBC Recent Labs  Lab 02/03/18 0524 02/04/18 0107 02/05/18 0413 02/06/18 0511 02/07/18 0545  WBC 16.5* 18.0* 21.0* 23.4* 27.8*  HGB 8.7* 8.3* 9.2* 9.1* 8.8*  HCT 28.8* 28.0* 30.8* 30.7* 29.4*  PLT 159 173 197 224 238  MCV 99.0 99.3 99.7 99.4 99.7  MCH 29.9 29.4 29.8 29.4 29.8  MCHC 30.2 29.6* 29.9* 29.6* 29.9*  RDW 19.7* 19.7* 19.9* 20.4*  20.8*    Chemistries  Recent Labs  Lab 02/04/18 0926 02/06/18 0511 02/07/18 0545  NA 135  --  135  K 4.5 5.3* 5.5*  CL 97*  --  90*  CO2 26  --  26  GLUCOSE 131*  --  187*  BUN 50*  --  62*  CREATININE 9.10*  --  9.29*  CALCIUM 8.3*  --  8.6*   ------------------------------------------------------------------------------------------------------------------ estimated creatinine clearance is 5.1 mL/min (A) (by C-G formula based on SCr of 9.29 mg/dL (H)). ------------------------------------------------------------------------------------------------------------------ No results for input(s): HGBA1C in the last 72 hours. ------------------------------------------------------------------------------------------------------------------ No results for input(s): CHOL, HDL, LDLCALC, TRIG, CHOLHDL, LDLDIRECT in the last 72 hours. ------------------------------------------------------------------------------------------------------------------ No results for input(s): TSH, T4TOTAL, T3FREE, THYROIDAB in the last 72 hours.  Invalid  input(s): FREET3 ------------------------------------------------------------------------------------------------------------------ No results for input(s): VITAMINB12, FOLATE, FERRITIN, TIBC, IRON, RETICCTPCT in the last 72 hours.  Coagulation profile No results for input(s): INR, PROTIME in the last 168 hours.  No results for input(s): DDIMER in the last 72 hours.  Cardiac Enzymes No results for input(s): CKMB, TROPONINI, MYOGLOBIN in the last 168 hours.  Invalid input(s): CK ------------------------------------------------------------------------------------------------------------------ Invalid input(s): [place]  Patient admitted with sepsis   1.  Sepsis,  with MSSA with left great toe osteomyelitis, Tricuspid endocarditis.   Status post removal of her permacath by vascular and temporary dialysis catheter. Repeat blood cultures  from 10/11 has been negative so far.  Still leukocytosis. Per Dr. Steva Ready, will need 6 weeks of IV Cefazolin which can be given during dialysis so she does not need another catheter  2 grams, 2grams and 3 grams until 03/13/18 with weekly CBC/CMP.  Acute ischemia of the left foot status post angioplasty of left popliteal by vascular , heparin drip was discontinued due to epistaxis.  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 Dressing changed 3 x's per week. F/u with podiatry in 2 weeks per Dr. Vickki Muff.  2.    Acute encephalopathy due to #1 now improved.   3.  PAD.,  Left leg ischemia, status post hospitalization, continue aspirin and statins. Heparin drip was discontinued,  vascular surgery recommends Eliquis.  Hold Eliquis, likely permacath placement on Monday   4.  End-stage renal disease on peritoneal dialysis.     Patient does not want peritoneal dialysis anymore. Right temp HD catheter.  Continue hemodialysis today and will need tunneled catheter before discharge per Dr. Juleen China.    5.  Diabetes type 2.  controlled, continue insulin with sliding scale coverage.  During the hospital stay.  Continue carb controlled renal diet.  6.  Chronic diastolic CHF, LV EF: 37% -   55%,  currently clinically compensated, continue maintenance therapy.  Continue Coreg, Imdur, midodrine.  7.  Peripheral neuropathy, continue pain medication.  #8. nausea likely secondary to sepsis/narcotic induced: And possible diabetic gastroparesis: continue Reglan, PPI, improved.  #.9. Hyperkalemia.  Potassium is up to 5.5 today.  Discontinue ibesartan, hemodialysis today.  Anemia of chronic disease.  Stable.  I discussed with Dr. Juleen China and Dr. Steva Ready.    Code Status Orders  (From admission, onward)         Start     Ordered   01/29/18 2250  Full code  Continuous     01/29/18 2250        Code Status History    Date Active Date Inactive Code Status Order ID Comments User  Context   10/24/2016 1055 10/25/2016 2243 Full Code [id number]  Hillary Bow, MD ED   10/17/2016 2143 10/19/2016 1440 Full Code [id number]  Vaughan Basta, MD Inpatient   09/04/2016 1708 09/05/2016 1500 Full Code [id number]  Epifanio Lesches, MD ED   08/22/2016 0938 08/22/2016 1833 Full Code [id number]  Algernon Huxley, MD Inpatient   06/27/2016 1121 06/27/2016 1846 Full Code [id number]  Algernon Huxley, MD Inpatient   10/03/2015 2235 10/04/2015 1724 Full Code [id number]  Harrie Foreman, MD Inpatient           Consults  NEPHROLOGY AND VASCULAR SURGERY   HEPRIN  Lab Results  Component Value Date   PLT 238 02/07/2018   High risk for cardiac arrest secondary to multiple medical problems. Time Spent in minutes 40 MIN   Greater than 50% of  time spent in care coordination and counseling patient regarding the condition and plan of care. discussed with patient's daughters bedside.  Demetrios Loll M.D on 02/07/2018 at 3:53 PM  Between 7am to 6pm - Pager - [phone]  After 6pm go to [email or web address] - Proofreader  [employer]   Office  [phone]

## 2018-02-08 LAB — BASIC METABOLIC PANEL
Anion gap: 11 (ref 5–15)
BUN: 38 mg/dL — ABNORMAL HIGH (ref 8–23)
CO2: 32 mmol/L (ref 22–32)
Calcium: 7.9 mg/dL — ABNORMAL LOW (ref 8.9–10.3)
Chloride: 96 mmol/L — ABNORMAL LOW (ref 98–111)
Creatinine, Ser: 5.74 mg/dL — ABNORMAL HIGH (ref 0.44–1.00)
GFR calc Af Amer: 8 mL/min — ABNORMAL LOW (ref >60)
GFR calc non Af Amer: 7 mL/min — ABNORMAL LOW (ref >60)
Glucose, Bld: 244 mg/dL — ABNORMAL HIGH (ref 70–99)
Potassium: 4.3 mmol/L (ref 3.5–5.1)
Sodium: 139 mmol/L (ref 135–145)

## 2018-02-08 LAB — CBC
HCT: 26 % — ABNORMAL LOW (ref 36.0–46.0)
Hemoglobin: 7.7 g/dL — ABNORMAL LOW (ref 12.0–15.0)
MCH: 29.7 pg (ref 26.0–34.0)
MCHC: 29.6 g/dL — ABNORMAL LOW (ref 30.0–36.0)
MCV: 100.4 fL — ABNORMAL HIGH (ref 80.0–100.0)
Platelets: 232 10*3/uL (ref 150–400)
RBC: 2.59 MIL/uL — ABNORMAL LOW (ref 3.87–5.11)
RDW: 21.2 % — ABNORMAL HIGH (ref 11.5–15.5)
WBC: 24.8 10*3/uL — ABNORMAL HIGH (ref 4.0–10.5)
nRBC: 0.1 % (ref 0.0–0.2)

## 2018-02-08 LAB — GLUCOSE, CAPILLARY
Glucose-Capillary: 206 mg/dL — ABNORMAL HIGH (ref 70–99)
Glucose-Capillary: 192 mg/dL — ABNORMAL HIGH (ref 70–99)
Glucose-Capillary: 231 mg/dL — ABNORMAL HIGH (ref 70–99)
Glucose-Capillary: 207 mg/dL — ABNORMAL HIGH (ref 70–99)
Glucose-Capillary: 171 mg/dL — ABNORMAL HIGH (ref 70–99)

## 2018-02-08 MED ORDER — BISACODYL 10 MG RE SUPP
10.0000 mg | Freq: Every day | RECTAL | Status: DC
  Administered 2018-02-08: 10 mg via RECTAL
  Filled 2018-02-08 (×3): qty 1

## 2018-02-08 MED ORDER — SEVELAMER CARBONATE 800 MG PO TABS
1600.0000 mg | ORAL_TABLET | Freq: Three times a day (TID) | ORAL | Status: DC
  Administered 2018-02-08 – 2018-02-15 (×16): 1600 mg via ORAL
  Filled 2018-02-08 (×17): qty 2

## 2018-02-08 NOTE — Progress Notes (Signed)
Physical Therapy Treatment Patient Details Name: Stephanie Ellis MRN: [id number] DOB: Feb 17, 1951 Today's Date: 02/08/2018    History of Present Illness Patient is a 67 year old female admitted with sepsis and receiving a catheterization of L LE on 01/30/18.  Dialysis permcath removed 02/04/18.  R fem temporary HD cath placed 02/06/18.  Pt s/p L great toe amp (MTPJ) 10/18.  PMH includes peritoneal dialysis, DMII, CABG x3, CAD, MI, ESRD, CAD, CHF, anorexia and anemia.    PT Comments    New PT order received s/p L great toe amp (MTPJ) and R fem temporary HD catheter placement 02/06/18.  Per hospital protocol, d/t R temporary HD fem cath restrictions, limited to ex's in bed.  Pt appearing to fatigue with ex's in bed but appearing motivated to participate as much as she could.  No pain B LE's at rest beginning/end of session but 8/10 R foot with movement (no pain L foot).  Pt also reporting 5/10 abdominal cramping (nursing notified of pt's pain).  Once pt's temporary fem HD catheter is removed, will assess OOB mobility (which per orders will be limited to transfers with [place] precautions).  POC updated.  Once able to perform OOB mobility, will monitor for need to update pt's frequency and any changes to POC/goals.  Until then, will continue with ex's in bed maintaining R temporary HD fem catheter restrictions.  Pt verbalizing understanding of above noted precautions/restrictions.   Follow Up Recommendations  SNF     Equipment Recommendations  Rolling walker with 5" wheels    Recommendations for Other Services       Precautions / Restrictions Precautions Precautions: Fall Precaution Comments: Temporary HD R fem cath restrictions; L abdominal peritoneal dialysis catheter Restrictions Weight Bearing Restrictions: Yes Other Position/Activity Restrictions: Heel WB'ing L foot for transfers only (once R temporary HD fem cath removed) with post op shoe    Mobility  Bed Mobility                General bed mobility comments: Deferred OOB mobility d/t R temporary HD fem cath restrictions  Transfers                 General transfer comment: Deferred OOB mobility d/t R temporary HD fem cath restrictions  Ambulation/Gait             General Gait Details: Not appropriate per orders (and also d/t R temporary HD fem cath restrictions)   Stairs             Wheelchair Mobility    Modified Rankin (Stroke Patients Only)       Balance                                            Cognition Arousal/Alertness: Awake/alert Behavior During Therapy: WFL for tasks assessed/performed Overall Cognitive Status: Within Functional Limits for tasks assessed                                        Exercises Total Joint Exercises Ankle Circles/Pumps: AROM;Strengthening;Both;10 reps;Supine Quad Sets: AROM;Strengthening;Both;10 reps;Supine Short Arc Quad: AAROM;Strengthening;Left;10 reps;Supine Heel Slides: AAROM;Strengthening;Left;10 reps;Supine Hip ABduction/ADduction: AAROM;Strengthening;Left;10 reps;Supine Straight Leg Raises: AAROM;Strengthening;Left;10 reps;Supine  B heelcord stretch x30 seconds.    General Comments General comments (skin integrity, edema, etc.): R temporary fem  HD cath in place; L abdominal peritoneal dialysis catheter in place.      Pertinent Vitals/Pain Pain Assessment: 0-10 Pain Score: 5  Pain Location: abdominal cramping Pain Descriptors / Indicators: Cramping Pain Intervention(s): Limited activity within patient's tolerance;Monitored during session;Repositioned;Other (comment)(RN notified)  Vitals (HR and O2 on room air) stable and WFL throughout treatment session.    Home Living                      Prior Function            PT Goals (current goals can now be found in the care plan section) Acute Rehab PT Goals Patient Stated Goal: To resume daily general activity as independently as  possible. PT Goal Formulation: With patient Time For Goal Achievement: 02/15/18 Potential to Achieve Goals: Fair Progress towards PT goals: Not progressing toward goals - comment(Limited ex's in bed d/t R temporary HD fem cath restrictions)    Frequency    Min 2X/week      PT Plan Current plan remains appropriate    Co-evaluation              AM-PAC PT "6 Clicks" Daily Activity  Outcome Measure  Difficulty turning over in bed (including adjusting bedclothes, sheets and blankets)?: A Lot Difficulty moving from lying on back to sitting on the side of the bed? : A Lot Difficulty sitting down on and standing up from a chair with arms (e.g., wheelchair, bedside commode, etc,.)?: Unable Help needed moving to and from a bed to chair (including a wheelchair)?: A Lot Help needed walking in hospital room?: Total Help needed climbing 3-5 steps with a railing? : Total 6 Click Score: 9    End of Session   Activity Tolerance: Patient tolerated treatment well Patient left: in bed;with call bell/phone within reach;with bed alarm set;with family/visitor present Nurse Communication: Mobility status;Precautions;Other (comment)(Pt's abdominal pain/cramping) PT Visit Diagnosis: Unsteadiness on feet (R26.81);Muscle weakness (generalized) (M62.81);Pain Pain - Right/Left: Right Pain - part of body: Ankle and joints of foot     Time: 1040-1103 PT Time Calculation (min) (ACUTE ONLY): 23 min  Charges:  $Therapeutic Exercise: 23-37 mins                    Leitha Bleak, PT 02/08/18, 11:40 AM [phone]

## 2018-02-08 NOTE — Progress Notes (Signed)
[place] at [facility]                                                                                                                                                                                  Patient Demographics   Stephanie Ellis, is a 67 y.o. female, DOB - April 02, 1951, MEB:[id number]  Admit date - 01/29/2018   Admitting Physician Amelia Jo, MD  Outpatient Primary MD for the patient is System, Pcp Not In   LOS - 10  Subjective:  Abdominal cramp, patient had a bowel movement this morning. Review of Systems:   CONSTITUTIONAL: No documented fever. No fatigue, weakness. No weight gain, no weight loss.  EYES: No blurry or double vision.  ENT: No tinnitus. No postnasal drip. No redness of the oropharynx.  RESPIRATORY: No cough, no wheeze, no hemoptysis. No dyspnea.  CARDIOVASCULAR: No chest pain. No orthopnea. No palpitations. No syncope.  GASTROINTESTINAL: No nausea or abdominal pain today.   GENITOURINARY: No dysuria or hematuria.  ENDOCRINE: No polyuria or nocturia. No heat or cold intolerance.  HEMATOLOGY: No anemia. No bruising. No bleeding.  INTEGUMENTARY: No rashes. No lesions.  MUSCULOSKELETAL: Left great toe amputation. NEUROLOGIC: No numbness, tingling, or ataxia. No seizure-type activity.  PSYCHIATRIC: No anxiety. No insomnia. No ADD.    Vitals:   Vitals:   02/07/18 1400 02/07/18 1603 02/07/18 2202 02/08/18 0422  BP: (!) 91/48 (!) 92/48 (!) 110/42 (!) 136/53  Pulse: 72 70 72 75  Resp: 17  20 18   Temp: 98.3 F (36.8 C)  98.8 F (37.1 C) 98.1 F (36.7 C)  TempSrc: Oral  Oral Oral  SpO2:   92% 96%  Weight:    56.5 kg  Height:        Wt Readings from Last 3 Encounters:  02/08/18 56.5 kg  10/15/17 58.1 kg  10/09/17 58.1 kg     Intake/Output Summary (Last 24 hours) at 02/08/2018 1506 Last data filed at 02/08/2018 0724 Gross per 24 hour  Intake 50.82 ml  Output 0 ml  Net 50.82 ml    Physical Exam:   GENERAL:  Pleasant-appearing in no apparent distress.  HEAD, EYES, EARS, NOSE AND THROAT: Atraumatic, normocephalic. Extraocular muscles are intact. Pupils equal and reactive to light. Sclerae anicteric. No conjunctival injection. No oro-pharyngeal erythema.  NECK: Supple. There is no jugular venous distention. No bruits, no lymphadenopathy, no thyromegaly.  HEART: Regular rate and rhythm,. No murmurs, no rubs, no clicks.  LUNGS: Clear to auscultation bilaterally. No rales or rhonchi. No wheezes.  ABDOMEN: Soft, flat, nontender, nondistended. Has good bowel sounds. No hepatosplenomegaly appreciated.  EXTREMITIES: Status left great toe amputation. some blood at temporary  dialysis catheter site. NEUROLOGIC: The patient is alert, awake, and oriented x3 with no focal motor or sensory deficits appreciated bilaterally.  SKIN: Moist and warm with no rashes appreciated.  Psych: Not anxious, depressed LN: No inguinal LN enlargement    Antibiotics   Anti-infectives (From admission, onward)   Start     Dose/Rate Route Frequency Ordered Stop   02/06/18 0832  ceFAZolin (ANCEF) 1-4 GM/50ML-% IVPB    Note to Pharmacy:  Norton Blizzard  : cabinet override      02/06/18 5170 02/06/18 0855   02/06/18 0831  vancomycin (VANCOCIN) 1-5 GM/200ML-% IVPB  Status:  Discontinued    Note to Pharmacy:  Norton Blizzard  : cabinet override      02/06/18 0831 02/06/18 0846   01/31/18 2200  ceFAZolin (ANCEF) IVPB 1 g/50 mL premix     1 g 100 mL/hr over 30 Minutes Intravenous Every 24 hours 01/31/18 1409     01/30/18 1800  vancomycin (VANCOCIN) IVPB 1000 mg/200 mL premix  Status:  Discontinued     1,000 mg 200 mL/hr over 60 Minutes Intravenous Every M-W-F (Hemodialysis) 01/30/18 0435 01/30/18 0906   01/30/18 1000  piperacillin-tazobactam (ZOSYN) IVPB 3.375 g  Status:  Discontinued     3.375 g 12.5 mL/hr over 240 Minutes Intravenous Every 12 hours 01/30/18 0435 01/30/18 0906   01/30/18 1000  ceFAZolin (ANCEF) 500 mg in  dextrose 5 % 100 mL IVPB  Status:  Discontinued     500 mg 210 mL/hr over 30 Minutes Intravenous Every 12 hours 01/30/18 0906 01/31/18 1409   01/29/18 2200  piperacillin-tazobactam (ZOSYN) IVPB 3.375 g     3.375 g 100 mL/hr over 30 Minutes Intravenous  Once 01/29/18 2146 01/29/18 2242   01/29/18 2200  vancomycin (VANCOCIN) IVPB 1000 mg/200 mL premix     1,000 mg 200 mL/hr over 60 Minutes Intravenous  Once 01/29/18 2146 01/29/18 2314      Medications   Scheduled Meds: . acetaminophen  650 mg Oral Q8H  . aspirin EC  81 mg Oral Daily  . atorvastatin  80 mg Oral QHS  . Atropine Sulfate-NaCl  1 drop Ophthalmic BID  . bisacodyl  10 mg Rectal Daily  . carvedilol  3.125 mg Oral BID WC  . Chlorhexidine Gluconate Cloth  6 each Topical Q0600  . cholecalciferol  400 Units Oral Once per day on Mon Wed Fri  . docusate sodium  100 mg Oral BID  . epoetin (EPOGEN/PROCRIT) injection  4,000 Units Intravenous Q M,W,F-HD  . famotidine  20 mg Oral Daily  . feeding supplement (NEPRO CARB STEADY)  237 mL Oral BID BM  . insulin aspart  0-5 Units Subcutaneous QHS  . insulin aspart  0-9 Units Subcutaneous TID WC  . isosorbide mononitrate  30 mg Oral Daily  . multivitamin  1 tablet Oral QHS  . multivitamin with minerals  1 tablet Oral Daily  . multivitamin-lutein  1 capsule Oral Daily  . sevelamer carbonate  1,600 mg Oral TID WC   Continuous Infusions: .  ceFAZolin (ANCEF) IV Stopped (02/07/18 2146)  . heparin Stopped (02/06/18 0744)   PRN Meds:.acetaminophen **OR** acetaminophen, HYDROmorphone (DILAUDID) injection, metoCLOPramide (REGLAN) injection, nitroGLYCERIN, ondansetron **OR** ondansetron (ZOFRAN) IV, oxyCODONE, traZODone   Data Review:   Micro Results Recent Results (from the past 240 hour(s))  Blood Culture (routine x 2)     Status: Abnormal   Collection Time: 01/29/18  9:55 PM  Result Value Ref Range Status   Specimen Description  Final    BLOOD RIGHT ANTECUBITAL Performed at  [facility], [address] [place], [place] [address]    Special Requests   Final    BOTTLES DRAWN AEROBIC AND ANAEROBIC Blood Culture adequate volume Performed at [facility], [address] [place], [place] [address]    Culture  Setup Time   Final    GRAM POSITIVE COCCI IN CLUSTERS IN BOTH AEROBIC AND ANAEROBIC BOTTLES CRITICAL RESULT CALLED TO, READ BACK BY AND VERIFIED WITH: KAREN HAYES 01/30/18 0839 JGF Performed at [facility], [address] [place], [place] [address]    Culture (A)  Final    STAPHYLOCOCCUS AUREUS SUSCEPTIBILITIES PERFORMED ON PREVIOUS CULTURE WITHIN THE LAST 5 DAYS. Performed at [facility], [place] [address] [place], [place] [address]    Report Status 02/01/2018 FINAL  Final  Blood Culture (routine x 2)     Status: Abnormal   Collection Time: 01/29/18  9:55 PM  Result Value Ref Range Status   Specimen Description   Final    BLOOD RIGHT ANTECUBITAL Performed at [facility], [address], [place], [place] [address]    Special Requests   Final    BOTTLES DRAWN AEROBIC AND ANAEROBIC Blood Culture adequate volume Performed at [facility], [address] [place], [place] [address]    Culture  Setup Time   Final    GRAM POSITIVE COCCI IN CLUSTERS IN BOTH AEROBIC AND ANAEROBIC BOTTLES CRITICAL RESULT CALLED TO, READ BACK BY AND VERIFIED WITH: KAREN HAYES 01/30/18 0839 JGF Performed at [facility], [place] [address], [place], [place] [address]    Culture STAPHYLOCOCCUS AUREUS (A)  Final   Report Status 02/01/2018 FINAL  Final   Organism ID, Bacteria STAPHYLOCOCCUS AUREUS  Final      Susceptibility   Staphylococcus aureus - MIC*    CIPROFLOXACIN <=0.5 SENSITIVE Sensitive     ERYTHROMYCIN <=0.25 SENSITIVE Sensitive     GENTAMICIN <=0.5 SENSITIVE Sensitive     OXACILLIN 0.5 SENSITIVE Sensitive     TETRACYCLINE <=1 SENSITIVE Sensitive     VANCOMYCIN <=0.5 SENSITIVE Sensitive      TRIMETH/SULFA <=10 SENSITIVE Sensitive     CLINDAMYCIN <=0.25 SENSITIVE Sensitive     RIFAMPIN <=0.5 SENSITIVE Sensitive     Inducible Clindamycin NEGATIVE Sensitive     * STAPHYLOCOCCUS AUREUS  Blood Culture ID Panel (Reflexed)     Status: Abnormal   Collection Time: 01/29/18  9:55 PM  Result Value Ref Range Status   Enterococcus species NOT DETECTED NOT DETECTED Final   Vancomycin resistance NOT DETECTED NOT DETECTED Final   Listeria monocytogenes NOT DETECTED NOT DETECTED Final   Staphylococcus species DETECTED (A) NOT DETECTED Final    Comment: CRITICAL RESULT CALLED TO, READ BACK BY AND VERIFIED WITH: KAREN HAYES 01/30/18 0839 JGF    Staphylococcus aureus (BCID) DETECTED (A) NOT DETECTED Final    Comment: CRITICAL RESULT CALLED TO, READ BACK BY AND VERIFIED WITH: KAREN HAYES 01/30/18 0839 JGF Methicillin (oxacillin) susceptible Staphylococcus aureus (MSSA). Preferred therapy is anti staphylococcal beta lactam antibiotic (Cefazolin or Nafcillin), unless clinically contraindicated.    Methicillin resistance NOT DETECTED NOT DETECTED Final   Streptococcus species NOT DETECTED NOT DETECTED Final   Streptococcus agalactiae NOT DETECTED NOT DETECTED Final   Streptococcus pneumoniae NOT DETECTED NOT DETECTED Final   Streptococcus pyogenes NOT DETECTED NOT DETECTED Final   Acinetobacter baumannii NOT DETECTED NOT DETECTED Final   Enterobacteriaceae species NOT DETECTED NOT  DETECTED Final   Enterobacter cloacae complex NOT DETECTED NOT DETECTED Final   Escherichia coli NOT DETECTED NOT DETECTED Final   Klebsiella oxytoca NOT DETECTED NOT DETECTED Final   Klebsiella pneumoniae NOT DETECTED NOT DETECTED Final   Proteus species NOT DETECTED NOT DETECTED Final   Serratia marcescens NOT DETECTED NOT DETECTED Final   Carbapenem resistance NOT DETECTED NOT DETECTED Final   Haemophilus influenzae NOT DETECTED NOT DETECTED Final   Neisseria meningitidis NOT DETECTED NOT DETECTED Final    Pseudomonas aeruginosa NOT DETECTED NOT DETECTED Final   Candida albicans NOT DETECTED NOT DETECTED Final   Candida glabrata NOT DETECTED NOT DETECTED Final   Candida krusei NOT DETECTED NOT DETECTED Final   Candida parapsilosis NOT DETECTED NOT DETECTED Final   Candida tropicalis NOT DETECTED NOT DETECTED Final    Comment: Performed at [facility], [address] [place], [place] [address]  MRSA PCR Screening     Status: None   Collection Time: 01/29/18 10:55 PM  Result Value Ref Range Status   MRSA by PCR NEGATIVE NEGATIVE Final    Comment:        The GeneXpert MRSA Assay (FDA approved for NASAL specimens only), is one component of a comprehensive MRSA colonization surveillance program. It is not intended to diagnose MRSA infection nor to guide or monitor treatment for MRSA infections. Performed at [facility], [address] [place], [place] [address]   Culture, blood (Routine X 2) w Reflex to ID Panel     Status: None   Collection Time: 01/30/18  9:27 AM  Result Value Ref Range Status   Specimen Description BLOOD RIGHT ANTECUBITAL  Final   Special Requests   Final    BOTTLES DRAWN AEROBIC AND ANAEROBIC Blood Culture adequate volume   Culture   Final    NO GROWTH 5 DAYS Performed at [facility], [address] [place], [place] [address]    Report Status 02/04/2018 FINAL  Final  MRSA PCR Screening     Status: None   Collection Time: 02/05/18 11:23 PM  Result Value Ref Range Status   MRSA by PCR NEGATIVE NEGATIVE Final    Comment:        The GeneXpert MRSA Assay (FDA approved for NASAL specimens only), is one component of a comprehensive MRSA colonization surveillance program. It is not intended to diagnose MRSA infection nor to guide or monitor treatment for MRSA infections. Performed at [facility], [address] [place], [place] [address]   Aerobic/Anaerobic Culture (surgical/deep wound)     Status: None  (Preliminary result)   Collection Time: 02/06/18  9:11 AM  Result Value Ref Range Status   Specimen Description TOE GREAT LEFT  Final   Special Requests   Final    NONE Performed at [facility], [address], [place], [place] [address]    Gram Stain   Final    NO WBC SEEN NO ORGANISMS SEEN Performed at [facility], [place] [address] [place],  [address]    Culture   Final    RARE STAPHYLOCOCCUS AUREUS NO ANAEROBES ISOLATED; CULTURE IN PROGRESS FOR 5 DAYS    Report Status PENDING  Incomplete   Organism ID, Bacteria STAPHYLOCOCCUS AUREUS  Final      Susceptibility   Staphylococcus aureus - MIC*    CIPROFLOXACIN <=0.5 SENSITIVE Sensitive     ERYTHROMYCIN <=0.25 SENSITIVE Sensitive     GENTAMICIN <=0.5 SENSITIVE Sensitive  OXACILLIN <=0.25 SENSITIVE Sensitive     TETRACYCLINE <=1 SENSITIVE Sensitive     VANCOMYCIN <=0.5 SENSITIVE Sensitive     TRIMETH/SULFA <=10 SENSITIVE Sensitive     CLINDAMYCIN <=0.25 SENSITIVE Sensitive     RIFAMPIN <=0.5 SENSITIVE Sensitive     Inducible Clindamycin NEGATIVE Sensitive     * RARE STAPHYLOCOCCUS AUREUS    Radiology Reports Dg Tibia/fibula Left  Result Date: 01/29/2018 CLINICAL DATA:  Presents with ED via ems from home Having pain to both lower legs But states left leg is worse Increased pain with palpation Decreased pulses to foot EXAM: LEFT TIBIA AND FIBULA - 2 VIEW COMPARISON:  None. FINDINGS: No fracture.  No bone lesion. Knee and ankle joints are normally aligned. There are arterial vascular calcifications. Soft tissues are otherwise unremarkable. IMPRESSION: Negative Electronically Signed   By: Lajean Manes M.D.   On: 01/29/2018 20:58   US Venous Img Lower Unilateral Left  Result Date: 01/29/2018 CLINICAL DATA:  Left leg pain EXAM: LEFT LOWER EXTREMITY VENOUS DOPPLER ULTRASOUND TECHNIQUE: Gray-scale sonography with graded compression, as well as color Doppler and duplex ultrasound were performed to evaluate  the lower extremity deep venous systems from the level of the common femoral vein and including the common femoral, femoral, profunda femoral, popliteal and calf veins including the posterior tibial, peroneal and gastrocnemius veins when visible. The superficial great saphenous vein was also interrogated. Spectral Doppler was utilized to evaluate flow at rest and with distal augmentation maneuvers in the common femoral, femoral and popliteal veins. COMPARISON:  None. FINDINGS: Contralateral Common Femoral Vein: Respiratory phasicity is normal and symmetric with the symptomatic side. No evidence of thrombus. Normal compressibility. Common Femoral Vein: No evidence of thrombus. Normal compressibility, respiratory phasicity and response to augmentation. Saphenofemoral Junction: No evidence of thrombus. Normal compressibility and flow on color Doppler imaging. Profunda Femoral Vein: No evidence of thrombus. Normal compressibility and flow on color Doppler imaging. Femoral Vein: No evidence of thrombus. Normal compressibility, respiratory phasicity and response to augmentation. Popliteal Vein: No evidence of thrombus. Normal compressibility, respiratory phasicity and response to augmentation. Calf Veins: No evidence of thrombus. Normal compressibility and flow on color Doppler imaging. Superficial Great Saphenous Vein: No evidence of thrombus. Normal compressibility. Venous Reflux:  None. Other Findings:  None. IMPRESSION: No evidence of deep venous thrombosis. Electronically Signed   By: Rolm Baptise M.D.   On: 01/29/2018 18:36   Dg Toe Great Left  Result Date: 01/29/2018 CLINICAL DATA:  Open wound to the left great toe for several months. EXAM: LEFT GREAT TOE COMPARISON:  10/07/2017 FINDINGS: Degenerative changes in the first metatarsal-phalangeal joint. Soft tissue defect over the plantar aspect of the left first toe. There is underlying erosion of the distal phalangeal tuft cortex suggesting focal osteomyelitis.  No evidence of acute fracture or dislocation. Diffuse bone demineralization. Prominent vascular calcifications. IMPRESSION: Soft tissue defect over the plantar aspect of the left first toe with underlying erosion of the distal phalangeal tuft suggesting osteomyelitis. Electronically Signed   By: Lucienne Capers M.D.   On: 01/29/2018 21:30     CBC Recent Labs  Lab 02/04/18 0107 02/05/18 0413 02/06/18 0511 02/07/18 0545 02/08/18 0439  WBC 18.0* 21.0* 23.4* 27.8* 24.8*  HGB 8.3* 9.2* 9.1* 8.8* 7.7*  HCT 28.0* 30.8* 30.7* 29.4* 26.0*  PLT 173 197 224 238 232  MCV 99.3 99.7 99.4 99.7 100.4*  MCH 29.4 29.8 29.4 29.8 29.7  MCHC 29.6* 29.9* 29.6* 29.9* 29.6*  RDW 19.7* 19.9* 20.4* 20.8*  21.2*    Chemistries  Recent Labs  Lab 02/04/18 0926 02/06/18 0511 02/07/18 0545 02/08/18 0439  NA 135  --  135 139  K 4.5 5.3* 5.5* 4.3  CL 97*  --  90* 96*  CO2 26  --  26 32  GLUCOSE 131*  --  187* 244*  BUN 50*  --  62* 38*  CREATININE 9.10*  --  9.29* 5.74*  CALCIUM 8.3*  --  8.6* 7.9*   ------------------------------------------------------------------------------------------------------------------ estimated creatinine clearance is 8.3 mL/min (A) (by C-G formula based on SCr of 5.74 mg/dL (H)). ------------------------------------------------------------------------------------------------------------------ No results for input(s): HGBA1C in the last 72 hours. ------------------------------------------------------------------------------------------------------------------ No results for input(s): CHOL, HDL, LDLCALC, TRIG, CHOLHDL, LDLDIRECT in the last 72 hours. ------------------------------------------------------------------------------------------------------------------ No results for input(s): TSH, T4TOTAL, T3FREE, THYROIDAB in the last 72 hours.  Invalid input(s):  FREET3 ------------------------------------------------------------------------------------------------------------------ No results for input(s): VITAMINB12, FOLATE, FERRITIN, TIBC, IRON, RETICCTPCT in the last 72 hours.  Coagulation profile No results for input(s): INR, PROTIME in the last 168 hours.  No results for input(s): DDIMER in the last 72 hours.  Cardiac Enzymes No results for input(s): CKMB, TROPONINI, MYOGLOBIN in the last 168 hours.  Invalid input(s): CK ------------------------------------------------------------------------------------------------------------------ Invalid input(s): [place]  Patient admitted with sepsis   1.  Sepsis,  with MSSA with left great toe osteomyelitis, Tricuspid endocarditis.   Status post removal of her permacath by vascular and temporary dialysis catheter. Repeat blood cultures from 10/11 has been negative so far.  Still leukocytosis. Per Dr. Steva Ready, will need 6 weeks of IV Cefazolin which can be given during dialysis so she does not need another catheter  2 grams, 2grams and 3 grams until 03/13/18 with weekly CBC/CMP.  Acute ischemia of the left foot status post angioplasty of left popliteal by vascular , heparin drip was discontinued due to epistaxis.    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 Dressing changed 3 x's per week. F/u with podiatry in 2 weeks per Dr. Vickki Muff.  2.    Acute encephalopathy due to #1 now improved.   3.  PAD.,  Left leg ischemia, status post hospitalization, continue aspirin and statins. Heparin drip was discontinued,  vascular surgery recommends Eliquis.  Hold Eliquis. Permacath placement tomorro. May start Eliquis after permacath.   4.  End-stage renal disease on peritoneal dialysis.     Patient does not want peritoneal dialysis anymore. S/P right temp HD catheter and hemodialysis, and will need tunneled catheter before discharge per Dr. Juleen China.    5.   Diabetes type 2.  controlled, continue insulin with sliding scale coverage.  During the hospital stay.  Continue carb controlled renal diet.  6.  Chronic diastolic CHF, LV EF: 25% -   55%,  currently clinically compensated, continue maintenance therapy.  Continue Coreg, Imdur, midodrine.  7.  Peripheral neuropathy, continue pain medication.  #8. nausea likely secondary to sepsis/narcotic induced: And possible diabetic gastroparesis: continue Reglan, PPI, improved.  #.9. Hyperkalemia.  Discontinued ibesartan, improved with hemodialysis..  Anemia of chronic disease.  Stable.  I discussed with Dr. Juleen China.    Code Status Orders  (From admission, onward)         Start     Ordered   01/29/18 2250  Full code  Continuous     01/29/18 2250        Code Status History    Date Active Date Inactive Code Status Order ID Comments User Context   10/24/2016 1055  10/25/2016 2243 Full Code [id number]  Hillary Bow, MD ED   10/17/2016 2143 10/19/2016 1440 Full Code [id number]  Vaughan Basta, MD Inpatient   09/04/2016 1708 09/05/2016 1500 Full Code [id number]  Epifanio Lesches, MD ED   08/22/2016 0938 08/22/2016 1833 Full Code [id number]  Algernon Huxley, MD Inpatient   06/27/2016 1121 06/27/2016 1846 Full Code [id number]  Algernon Huxley, MD Inpatient   10/03/2015 2235 10/04/2015 1724 Full Code [id number]  Harrie Foreman, MD Inpatient           Consults  NEPHROLOGY AND VASCULAR SURGERY   HEPRIN  Lab Results  Component Value Date   PLT 232 02/08/2018   High risk for cardiac arrest secondary to multiple medical problems. Time Spent in minutes 26 MIN   Greater than 50% of time spent in care coordination and counseling patient regarding the condition and plan of care. discussed with patient's sister bedside.  Demetrios Loll M.D on 02/08/2018 at 3:06 PM  Between 7am to 6pm - Pager - [phone]  After 6pm go to [email or web address] - Proofreader  [employer]   Office  [phone]

## 2018-02-08 NOTE — Progress Notes (Signed)
[facility]  ROUNDING NOTE   Subjective:   Daughter at bedside. Hemodialysis treatment yesterday. Hypotensive during treatment. UF of 844m.   Objective:  Vital signs in last 24 hours:  Temp:  [98.1 F (36.7 C)-98.8 F (37.1 C)] 98.1 F (36.7 C) (10/20 0422) Pulse Rate:  [70-75] 75 (10/20 0422) Resp:  [17-20] 18 (10/20 0422) BP: (91-136)/(42-53) 136/53 (10/20 0422) SpO2:  [92 %-96 %] 96 % (10/20 0422) Weight:  [56.5 kg] 56.5 kg (10/20 0422)  Weight change: -0.327 kg Filed Weights   02/06/18 0425 02/06/18 1420 02/08/18 0422  Weight: 56.3 kg 56.8 kg 56.5 kg    Intake/Output: I/O last 3 completed shifts: In: 52.4 [IV Piggyback:52.4] Out: -804    Intake/Output this shift:  Total I/O In: 50.8 [IV Piggyback:50.8] Out: -   Physical Exam: General: NAD, laying in bed, chronically ill-appearing  Head: Normocephalic, atraumatic. Moist oral mucosal membranes  Eyes: Anicteric   Neck: Supple,    Lungs:  Clear to auscultation  Heart: Regular rate and rhythm  Abdomen:  Soft, nontender,   Extremities:  Foot in bandages  Neurologic: Alert, oreinted  Access: PD catheter, right femoral temp catheter Dr. SDelana Meyer   Basic Metabolic Panel: Recent Labs  Lab 02/02/18 0410 02/04/18 0937910/18/19 0511 02/07/18 0545 02/08/18 0439  NA  --  135  --  135 139  K  --  4.5 5.3* 5.5* 4.3  CL  --  97*  --  90* 96*  CO2  --  26  --  26 32  GLUCOSE  --  131*  --  187* 244*  BUN  --  50*  --  62* 38*  CREATININE  --  9.10*  --  9.29* 5.74*  CALCIUM  --  8.3*  --  8.6* 7.9*  PHOS 9.1* 4.3  --   --   --     Liver Function Tests: Recent Labs  Lab 02/04/18 0926  ALBUMIN 2.0*   No results for input(s): LIPASE, AMYLASE in the last 168 hours. No results for input(s): AMMONIA in the last 168 hours.  CBC: Recent Labs  Lab 02/04/18 0107 02/05/18 0413 02/06/18 0511 02/07/18 0545 02/08/18 0439  WBC 18.0* 21.0* 23.4* 27.8* 24.8*  HGB 8.3* 9.2* 9.1* 8.8* 7.7*  HCT 28.0*  30.8* 30.7* 29.4* 26.0*  MCV 99.3 99.7 99.4 99.7 100.4*  PLT 173 197 224 238 232    Cardiac Enzymes: No results for input(s): CKTOTAL, CKMB, CKMBINDEX, TROPONINI in the last 168 hours.  BNP: Invalid input(s): POCBNP  CBG: Recent Labs  Lab 02/07/18 1641 02/07/18 2206 02/08/18 0132 02/08/18 0737 02/08/18 1204  GLUCAP 138* 266* 206* 192* 231*    Microbiology: Results for orders placed or performed during the hospital encounter of 01/29/18  Blood Culture (routine x 2)     Status: Abnormal   Collection Time: 01/29/18  9:55 PM  Result Value Ref Range Status   Specimen Description   Final    BLOOD RIGHT [facility]ECUBITAL [facility] [address] [place] [address]   Special Requests   Final    BOTTLES DRAWN AEROBIC AND ANAEROBIC Blood Culture adequate volume [facility] [address], [place]  [id number]   Culture  Setup Time   Final    GRAM POSITIVE COCCI IN CLUSTERS IN BOTH AEROBIC AND ANAEROBIC BOTTLES CRITICAL RESULT CALLED TO, READ BACK BY AND VERIFIED WITH: KAREN HAYES 01/30/18 0839 JGF [facility], [place], [place] [address]    Culture (A)  Final    STAPHYLOCOCCUS AUREUS SUSCEPTIBILITIES PERFORMED ON PREVIOUS CULTURE WITHIN THE LAST 5 DAYS. Performed at [facility], [place] [address] [place], [place] [address]    Report Status 02/01/2018 FINAL  Final  Blood Culture (routine x 2)     Status: Abnormal   Collection Time: 01/29/18  9:55 PM  Result Value Ref Range Status   Specimen Description   Final    BLOOD RIGHT ANTECUBITAL Performed at [facility], [address] [place], [place] [address]    Special Requests   Final    BOTTLES DRAWN AEROBIC AND ANAEROBIC Blood Culture adequate volume Performed at [facility], [address] [place], [place] [address]    Culture  Setup Time   Final    GRAM POSITIVE COCCI IN CLUSTERS IN BOTH  AEROBIC AND ANAEROBIC BOTTLES CRITICAL RESULT CALLED TO, READ BACK BY AND VERIFIED WITH: KAREN HAYES 01/30/18 0839 JGF Performed at [facility], [place] [address] [place], [place] [address]    Culture STAPHYLOCOCCUS AUREUS (A)  Final   Report Status 02/01/2018 FINAL  Final   Organism ID, Bacteria STAPHYLOCOCCUS AUREUS  Final      Susceptibility   Staphylococcus aureus - MIC*    CIPROFLOXACIN <=0.5 SENSITIVE Sensitive     ERYTHROMYCIN <=0.25 SENSITIVE Sensitive     GENTAMICIN <=0.5 SENSITIVE Sensitive     OXACILLIN 0.5 SENSITIVE Sensitive     TETRACYCLINE <=1 SENSITIVE Sensitive     VANCOMYCIN <=0.5 SENSITIVE Sensitive     TRIMETH/SULFA <=10 SENSITIVE Sensitive     CLINDAMYCIN <=0.25 SENSITIVE Sensitive     RIFAMPIN <=0.5 SENSITIVE Sensitive     Inducible Clindamycin NEGATIVE Sensitive     * STAPHYLOCOCCUS AUREUS  Blood Culture ID Panel (Reflexed)     Status: Abnormal   Collection Time: 01/29/18  9:55 PM  Result Value Ref Range Status   Enterococcus species NOT DETECTED NOT DETECTED Final   Vancomycin resistance NOT DETECTED NOT DETECTED Final   Listeria monocytogenes NOT DETECTED NOT DETECTED Final   Staphylococcus species DETECTED (A) NOT DETECTED Final    Comment: CRITICAL RESULT CALLED TO, READ BACK BY AND VERIFIED WITH: KAREN HAYES 01/30/18 0839 JGF    Staphylococcus aureus (BCID) DETECTED (A) NOT DETECTED Final    Comment: CRITICAL RESULT CALLED TO, READ BACK BY AND VERIFIED WITH: KAREN HAYES 01/30/18 0839 JGF Methicillin (oxacillin) susceptible Staphylococcus aureus (MSSA). Preferred therapy is anti staphylococcal beta lactam antibiotic (Cefazolin or Nafcillin), unless clinically contraindicated.    Methicillin resistance NOT DETECTED NOT DETECTED Final   Streptococcus species NOT DETECTED NOT DETECTED Final   Streptococcus agalactiae NOT DETECTED NOT DETECTED Final   Streptococcus pneumoniae NOT DETECTED NOT DETECTED Final   Streptococcus pyogenes NOT DETECTED NOT  DETECTED Final   Acinetobacter baumannii NOT DETECTED NOT DETECTED Final   Enterobacteriaceae species NOT DETECTED NOT DETECTED Final   Enterobacter cloacae complex NOT DETECTED NOT DETECTED Final   Escherichia coli NOT DETECTED NOT DETECTED Final   Klebsiella oxytoca NOT DETECTED NOT DETECTED Final   Klebsiella pneumoniae NOT DETECTED NOT DETECTED Final   Proteus species NOT DETECTED NOT DETECTED Final   Serratia marcescens NOT DETECTED NOT DETECTED Final   Carbapenem resistance NOT DETECTED NOT DETECTED Final   Haemophilus influenzae NOT DETECTED NOT DETECTED Final   Neisseria meningitidis NOT DETECTED NOT DETECTED Final   Pseudomonas aeruginosa NOT DETECTED NOT DETECTED Final  Candida albicans NOT DETECTED NOT DETECTED Final   Candida glabrata NOT DETECTED NOT DETECTED Final   Candida krusei NOT DETECTED NOT DETECTED Final   Candida parapsilosis NOT DETECTED NOT DETECTED Final   Candida tropicalis NOT DETECTED NOT DETECTED Final    Comment: Performed at [facility], [address] [place], [place] [address]  MRSA PCR Screening     Status: None   Collection Time: 01/29/18 10:55 PM  Result Value Ref Range Status   MRSA by PCR NEGATIVE NEGATIVE Final    Comment:        The GeneXpert MRSA Assay (FDA approved for NASAL specimens only), is one component of a comprehensive MRSA colonization surveillance program. It is not intended to diagnose MRSA infection nor to guide or monitor treatment for MRSA infections. Performed at [facility], [address] [place], [place] [address]   Culture, blood (Routine X 2) w Reflex to ID Panel     Status: None   Collection Time: 01/30/18  9:27 AM  Result Value Ref Range Status   Specimen Description BLOOD RIGHT ANTECUBITAL  Final   Special Requests   Final    BOTTLES DRAWN AEROBIC AND ANAEROBIC Blood Culture adequate volume   Culture   Final    NO GROWTH 5 DAYS Performed at [facility], [address] [place], [place] [address]    Report Status 02/04/2018 FINAL  Final  MRSA PCR Screening     Status: None   Collection Time: 02/05/18 11:23 PM  Result Value Ref Range Status   MRSA by PCR NEGATIVE NEGATIVE Final    Comment:        The GeneXpert MRSA Assay (FDA approved for NASAL specimens only), is one component of a comprehensive MRSA colonization surveillance program. It is not intended to diagnose MRSA infection nor to guide or monitor treatment for MRSA infections. Performed at [facility], [address] [place], [place] [address]   Aerobic/Anaerobic Culture (surgical/deep wound)     Status: None (Preliminary result)   Collection Time: 02/06/18  9:11 AM  Result Value Ref Range Status   Specimen Description TOE GREAT LEFT  Final   Special Requests   Final    NONE Performed at [facility], [address] [place], [place] [address]    Gram Stain   Final    NO WBC SEEN NO ORGANISMS SEEN Performed at [facility], [place] [address] [place],  [address]    Culture   Final    RARE STAPHYLOCOCCUS AUREUS NO ANAEROBES ISOLATED; CULTURE IN PROGRESS FOR 5 DAYS    Report Status PENDING  Incomplete   Organism ID, Bacteria STAPHYLOCOCCUS AUREUS  Final      Susceptibility   Staphylococcus aureus - MIC*    CIPROFLOXACIN <=0.5 SENSITIVE Sensitive     ERYTHROMYCIN <=0.25 SENSITIVE Sensitive     GENTAMICIN <=0.5 SENSITIVE Sensitive     OXACILLIN <=0.25 SENSITIVE Sensitive     TETRACYCLINE <=1 SENSITIVE Sensitive     VANCOMYCIN <=0.5 SENSITIVE Sensitive     TRIMETH/SULFA <=10 SENSITIVE Sensitive     CLINDAMYCIN <=0.25 SENSITIVE Sensitive     RIFAMPIN <=0.5 SENSITIVE Sensitive     Inducible Clindamycin NEGATIVE Sensitive     * RARE STAPHYLOCOCCUS AUREUS    Coagulation Studies: No results for input(s): LABPROT, INR in the last 72 hours.  Urinalysis: No results for input(s): COLORURINE, LABSPEC, PHURINE, GLUCOSEU, HGBUR, BILIRUBINUR, KETONESUR,  PROTEINUR, UROBILINOGEN, NITRITE, LEUKOCYTESUR in the  last 72 hours.  Invalid input(s): APPERANCEUR    Imaging: No results found.   Medications:   .  ceFAZolin (ANCEF) IV Stopped (02/07/18 2146)  . heparin Stopped (02/06/18 0744)   . acetaminophen  650 mg Oral Q8H  . aspirin EC  81 mg Oral Daily  . atorvastatin  80 mg Oral QHS  . Atropine Sulfate-NaCl  1 drop Ophthalmic BID  . bisacodyl  10 mg Rectal Daily  . carvedilol  3.125 mg Oral BID WC  . Chlorhexidine Gluconate Cloth  6 each Topical Q0600  . cholecalciferol  400 Units Oral Once per day on Mon Wed Fri  . docusate sodium  100 mg Oral BID  . epoetin (EPOGEN/PROCRIT) injection  4,000 Units Intravenous Q M,W,F-HD  . famotidine  20 mg Oral Daily  . feeding supplement (NEPRO CARB STEADY)  237 mL Oral BID BM  . insulin aspart  0-5 Units Subcutaneous QHS  . insulin aspart  0-9 Units Subcutaneous TID WC  . isosorbide mononitrate  30 mg Oral Daily  . multivitamin  1 tablet Oral QHS  . multivitamin with minerals  1 tablet Oral Daily  . multivitamin-lutein  1 capsule Oral Daily   acetaminophen **OR** acetaminophen, HYDROmorphone (DILAUDID) injection, metoCLOPramide (REGLAN) injection, nitroGLYCERIN, ondansetron **OR** ondansetron (ZOFRAN) IV, oxyCODONE, traZODone  Assessment/ Plan:  [name] is a 67 y.o. black female with end stage renal disease on peritoneal dialysis, hypertension, congestive heart failure, coronary artery disease status post CABG, diabetes mellitus type II insulin dependent, GERD, peripheral vascular disease admitted to [facility] on 01/29/2018 for Peripheral vascular disease ([place]) [I73.9] Right leg pain [M79.604] Left leg pain [M79.605] Ulcer of great toe, left, with unspecified severity [facility]) [L97.529]   [facility] CCPD 10 hours 5 exchanges 2 liter fills  Transition to hemodialysis.   1. End Stage Renal Disease with hyperkalemia: transitioned from PD to  hemodialysis.  Right temp HD catheter. Will need tunneled catheter before discharge. Scheduled for Monday - TTS schedule - Outpatient planning for [facility].   2.  Staphylococcus aureus bacteremia/Sepsis/ Left great toe osteomyelitis/endocarditis - PermCath has been removed Temporary dialysis catheter placed on 10/18 - Cefazolin for 6 weeks  3. Anemia of chronic kidney disease: hemoglobin 7.7 EPO with HD  4. Secondary Hyperparathyroidism:  - restart sevelamer  5. Hypertension: hypotension on treatment. Currently ordered carvedilol, irbesartan, isosorbide mononitrate - hold isosorbide mononitrate  - monitor for hypotension, consider midodrine before treatment.   6. Peripheral vascular disease: left first toe with gangrene and pain.  - underwent limb salvage angioplasty on 01/30/18 - Left great toe amputation 02/06/2018   LOS: 10 Stephanie Ellis 10/20/20191:32 PM

## 2018-02-09 LAB — CBC
HCT: 27.1 % — ABNORMAL LOW (ref 36.0–46.0)
Hemoglobin: 8.1 g/dL — ABNORMAL LOW (ref 12.0–15.0)
MCH: 30.3 pg (ref 26.0–34.0)
MCHC: 29.9 g/dL — ABNORMAL LOW (ref 30.0–36.0)
MCV: 101.5 fL — ABNORMAL HIGH (ref 80.0–100.0)
Platelets: 249 10*3/uL (ref 150–400)
RBC: 2.67 MIL/uL — ABNORMAL LOW (ref 3.87–5.11)
RDW: 21.4 % — ABNORMAL HIGH (ref 11.5–15.5)
WBC: 23.8 10*3/uL — ABNORMAL HIGH (ref 4.0–10.5)
nRBC: 0 % (ref 0.0–0.2)

## 2018-02-09 LAB — GLUCOSE, CAPILLARY
Glucose-Capillary: 173 mg/dL — ABNORMAL HIGH (ref 70–99)
Glucose-Capillary: 116 mg/dL — ABNORMAL HIGH (ref 70–99)
Glucose-Capillary: 194 mg/dL — ABNORMAL HIGH (ref 70–99)
Glucose-Capillary: 213 mg/dL — ABNORMAL HIGH (ref 70–99)

## 2018-02-09 MED ORDER — HEPARIN BOLUS VIA INFUSION
2800.0000 [IU] | Freq: Once | INTRAVENOUS | Status: AC
  Administered 2018-02-09: 2800 [IU] via INTRAVENOUS
  Filled 2018-02-09: qty 2800

## 2018-02-09 MED ORDER — HEPARIN SODIUM (PORCINE) 5000 UNIT/ML IJ SOLN: 5000.0000 [IU] | Freq: Three times a day (TID) | INTRAMUSCULAR | Status: DC

## 2018-02-09 MED ORDER — HEPARIN (PORCINE) IN NACL 100-0.45 UNIT/ML-% IJ SOLN
650.0000 [IU]/h | INTRAMUSCULAR | Status: DC
  Administered 2018-02-09 – 2018-02-10 (×2): 650 [IU]/h via INTRAVENOUS
  Filled 2018-02-09: qty 250

## 2018-02-09 NOTE — Progress Notes (Signed)
[facility]  ROUNDING NOTE   Subjective:  Patient seen at bedside. It appears that PermCath will be placed on Wednesday. Due for hemodialysis again tomorrow.   Objective:  Vital signs in last 24 hours:  Temp:  [98.2 F (36.8 C)-98.4 F (36.9 C)] 98.2 F (36.8 C) (10/21 1338) Pulse Rate:  [66-71] 66 (10/21 1338) Resp:  [16-20] 16 (10/21 1338) BP: (129-173)/(45-55) 148/55 (10/21 1338) SpO2:  [90 %-95 %] 90 % (10/21 1338) Weight:  [57.4 kg] 57.4 kg (10/21 0424)  Weight change: 0.927 kg Filed Weights   02/06/18 1420 02/08/18 0422 02/09/18 0424  Weight: 56.8 kg 56.5 kg 57.4 kg    Intake/Output: I/O last 3 completed shifts: In: 170.8 [P.O.:120; IV Piggyback:50.8] Out: 0    Intake/Output this shift:  No intake/output data recorded.  Physical Exam: General: NAD, laying in bed, chronically ill-appearing  Head: Normocephalic, atraumatic. Moist oral mucosal membranes  Eyes: Anicteric   Neck: Supple  Lungs:  Clear to auscultation  Heart: Regular rate and rhythm  Abdomen:  Soft, nontender, BS present  Extremities: Trace LE edema  Neurologic: Alert, oriented x 3  Access: PD catheter, right femoral temp catheter Dr. Delana Meyer    Basic Metabolic Panel: Recent Labs  Lab 02/04/18 0926 02/06/18 0511 02/07/18 0545 02/08/18 0439  NA 135  --  135 139  K 4.5 5.3* 5.5* 4.3  CL 97*  --  90* 96*  CO2 26  --  26 32  GLUCOSE 131*  --  187* 244*  BUN 50*  --  62* 38*  CREATININE 9.10*  --  9.29* 5.74*  CALCIUM 8.3*  --  8.6* 7.9*  PHOS 4.3  --   --   --     Liver Function Tests: Recent Labs  Lab 02/04/18 0926  ALBUMIN 2.0*   No results for input(s): LIPASE, AMYLASE in the last 168 hours. No results for input(s): AMMONIA in the last 168 hours.  CBC: Recent Labs  Lab 02/05/18 0413 02/06/18 0511 02/07/18 0545 02/08/18 0439 02/09/18 0803  WBC 21.0* 23.4* 27.8* 24.8* 23.8*  HGB 9.2* 9.1* 8.8* 7.7* 8.1*  HCT 30.8* 30.7* 29.4* 26.0* 27.1*  MCV 99.7 99.4  99.7 100.4* 101.5*  PLT 197 224 238 232 249    Cardiac Enzymes: No results for input(s): CKTOTAL, CKMB, CKMBINDEX, TROPONINI in the last 168 hours.  BNP: Invalid input(s): POCBNP  CBG: Recent Labs  Lab 02/08/18 1648 02/08/18 2141 02/09/18 0738 02/09/18 1131 02/09/18 1655  GLUCAP 207* 171* 173* 116* 194*    Microbiology: Results for orders placed or performed during the hospital encounter of 01/29/18  Blood Culture (routine x 2)     Status: Abnormal   Collection Time: 01/29/18  9:55 PM  Result Value Ref Range Status   Specimen Description   Final    BLOOD RIGHT ANTECUBITAL Performed at [facility], [address] [place], [place] [address]    Special Requests   Final    BOTTLES DRAWN AEROBIC AND ANAEROBIC Blood Culture adequate volume Performed at [facility], [address] [place], [place] [address]    Culture  Setup Time   Final    GRAM POSITIVE COCCI IN CLUSTERS IN BOTH AEROBIC AND ANAEROBIC BOTTLES CRITICAL RESULT CALLED TO, READ BACK BY AND VERIFIED WITH: KAREN HAYES 01/30/18 0839 JGF Performed at [facility], [address] [place], [place] [address]    Culture (A)  Final    STAPHYLOCOCCUS AUREUS SUSCEPTIBILITIES PERFORMED ON PREVIOUS CULTURE WITHIN  THE LAST 5 DAYS. Performed at [facility], [place] [address] [place], [place] [address]    Report Status 02/01/2018 FINAL  Final  Blood Culture (routine x 2)     Status: Abnormal   Collection Time: 01/29/18  9:55 PM  Result Value Ref Range Status   Specimen Description   Final    BLOOD RIGHT ANTECUBITAL Performed at [facility], [address] [place], [place] [address]    Special Requests   Final    BOTTLES DRAWN AEROBIC AND ANAEROBIC Blood Culture adequate volume Performed at [facility], [address] [place], [place] [address]    Culture  Setup Time   Final    GRAM POSITIVE COCCI IN CLUSTERS IN BOTH AEROBIC AND ANAEROBIC BOTTLES CRITICAL  RESULT CALLED TO, READ BACK BY AND VERIFIED WITH: KAREN HAYES 01/30/18 0839 JGF Performed at [facility], [place] [address] [place], [place] [address]    Culture STAPHYLOCOCCUS AUREUS (A)  Final   Report Status 02/01/2018 FINAL  Final   Organism ID, Bacteria STAPHYLOCOCCUS AUREUS  Final      Susceptibility   Staphylococcus aureus - MIC*    CIPROFLOXACIN <=0.5 SENSITIVE Sensitive     ERYTHROMYCIN <=0.25 SENSITIVE Sensitive     GENTAMICIN <=0.5 SENSITIVE Sensitive     OXACILLIN 0.5 SENSITIVE Sensitive     TETRACYCLINE <=1 SENSITIVE Sensitive     VANCOMYCIN <=0.5 SENSITIVE Sensitive     TRIMETH/SULFA <=10 SENSITIVE Sensitive     CLINDAMYCIN <=0.25 SENSITIVE Sensitive     RIFAMPIN <=0.5 SENSITIVE Sensitive     Inducible Clindamycin NEGATIVE Sensitive     * STAPHYLOCOCCUS AUREUS  Blood Culture ID Panel (Reflexed)     Status: Abnormal   Collection Time: 01/29/18  9:55 PM  Result Value Ref Range Status   Enterococcus species NOT DETECTED NOT DETECTED Final   Vancomycin resistance NOT DETECTED NOT DETECTED Final   Listeria monocytogenes NOT DETECTED NOT DETECTED Final   Staphylococcus species DETECTED (A) NOT DETECTED Final    Comment: CRITICAL RESULT CALLED TO, READ BACK BY AND VERIFIED WITH: KAREN HAYES 01/30/18 0839 JGF    Staphylococcus aureus (BCID) DETECTED (A) NOT DETECTED Final    Comment: CRITICAL RESULT CALLED TO, READ BACK BY AND VERIFIED WITH: KAREN HAYES 01/30/18 0839 JGF Methicillin (oxacillin) susceptible Staphylococcus aureus (MSSA). Preferred therapy is anti staphylococcal beta lactam antibiotic (Cefazolin or Nafcillin), unless clinically contraindicated.    Methicillin resistance NOT DETECTED NOT DETECTED Final   Streptococcus species NOT DETECTED NOT DETECTED Final   Streptococcus agalactiae NOT DETECTED NOT DETECTED Final   Streptococcus pneumoniae NOT DETECTED NOT DETECTED Final   Streptococcus pyogenes NOT DETECTED NOT DETECTED Final   Acinetobacter baumannii  NOT DETECTED NOT DETECTED Final   Enterobacteriaceae species NOT DETECTED NOT DETECTED Final   Enterobacter cloacae complex NOT DETECTED NOT DETECTED Final   Escherichia coli NOT DETECTED NOT DETECTED Final   Klebsiella oxytoca NOT DETECTED NOT DETECTED Final   Klebsiella pneumoniae NOT DETECTED NOT DETECTED Final   Proteus species NOT DETECTED NOT DETECTED Final   Serratia marcescens NOT DETECTED NOT DETECTED Final   Carbapenem resistance NOT DETECTED NOT DETECTED Final   Haemophilus influenzae NOT DETECTED NOT DETECTED Final   Neisseria meningitidis NOT DETECTED NOT DETECTED Final   Pseudomonas aeruginosa NOT DETECTED NOT DETECTED Final   Candida albicans NOT DETECTED NOT DETECTED Final   Candida glabrata NOT DETECTED NOT DETECTED Final   Candida krusei NOT DETECTED NOT DETECTED Final  Candida parapsilosis NOT DETECTED NOT DETECTED Final   Candida tropicalis NOT DETECTED NOT DETECTED Final    Comment: Performed at [facility], [address] [place], [place] [address]  MRSA PCR Screening     Status: None   Collection Time: 01/29/18 10:55 PM  Result Value Ref Range Status   MRSA by PCR NEGATIVE NEGATIVE Final    Comment:        The GeneXpert MRSA Assay (FDA approved for NASAL specimens only), is one component of a comprehensive MRSA colonization surveillance program. It is not intended to diagnose MRSA infection nor to guide or monitor treatment for MRSA infections. Performed at [facility], [address] [place], [place] [address]   Culture, blood (Routine X 2) w Reflex to ID Panel     Status: None   Collection Time: 01/30/18  9:27 AM  Result Value Ref Range Status   Specimen Description BLOOD RIGHT ANTECUBITAL  Final   Special Requests   Final    BOTTLES DRAWN AEROBIC AND ANAEROBIC Blood Culture adequate volume   Culture   Final    NO GROWTH 5 DAYS Performed at [facility], [address] [place], [place] [address]    Report Status  02/04/2018 FINAL  Final  MRSA PCR Screening     Status: None   Collection Time: 02/05/18 11:23 PM  Result Value Ref Range Status   MRSA by PCR NEGATIVE NEGATIVE Final    Comment:        The GeneXpert MRSA Assay (FDA approved for NASAL specimens only), is one component of a comprehensive MRSA colonization surveillance program. It is not intended to diagnose MRSA infection nor to guide or monitor treatment for MRSA infections. Performed at [facility], [address] [place], [place] [address]   Aerobic/Anaerobic Culture (surgical/deep wound)     Status: None (Preliminary result)   Collection Time: 02/06/18  9:11 AM  Result Value Ref Range Status   Specimen Description TOE GREAT LEFT  Final   Special Requests   Final    NONE Performed at [facility], [address] [place], [place] [address]    Gram Stain   Final    NO WBC SEEN NO ORGANISMS SEEN Performed at [facility], [place] [address], [place], [place] [address]    Culture   Final    RARE STAPHYLOCOCCUS AUREUS NO ANAEROBES ISOLATED; CULTURE IN PROGRESS FOR 5 DAYS    Report Status PENDING  Incomplete   Organism ID, Bacteria STAPHYLOCOCCUS AUREUS  Final      Susceptibility   Staphylococcus aureus - MIC*    CIPROFLOXACIN <=0.5 SENSITIVE Sensitive     ERYTHROMYCIN <=0.25 SENSITIVE Sensitive     GENTAMICIN <=0.5 SENSITIVE Sensitive     OXACILLIN <=0.25 SENSITIVE Sensitive     TETRACYCLINE <=1 SENSITIVE Sensitive     VANCOMYCIN <=0.5 SENSITIVE Sensitive     TRIMETH/SULFA <=10 SENSITIVE Sensitive     CLINDAMYCIN <=0.25 SENSITIVE Sensitive     RIFAMPIN <=0.5 SENSITIVE Sensitive     Inducible Clindamycin NEGATIVE Sensitive     * RARE STAPHYLOCOCCUS AUREUS    Coagulation Studies: No results for input(s): LABPROT, INR in the last 72 hours.  Urinalysis: No results for input(s): COLORURINE, LABSPEC, PHURINE, GLUCOSEU, HGBUR, BILIRUBINUR, KETONESUR, PROTEINUR, UROBILINOGEN, NITRITE, LEUKOCYTESUR  in the last 72 hours.  Invalid input(s): APPERANCEUR    Imaging: No results found.   Medications:   .  ceFAZolin (ANCEF) IV 1 g (02/08/18  2208)   . acetaminophen  650 mg Oral Q8H  . aspirin EC  81 mg Oral Daily  . atorvastatin  80 mg Oral QHS  . Atropine Sulfate-NaCl  1 drop Ophthalmic BID  . bisacodyl  10 mg Rectal Daily  . carvedilol  3.125 mg Oral BID WC  . Chlorhexidine Gluconate Cloth  6 each Topical Q0600  . cholecalciferol  400 Units Oral Once per day on Mon Wed Fri  . docusate sodium  100 mg Oral BID  . epoetin (EPOGEN/PROCRIT) injection  4,000 Units Intravenous Q M,W,F-HD  . famotidine  20 mg Oral Daily  . feeding supplement (NEPRO CARB STEADY)  237 mL Oral BID BM  . insulin aspart  0-5 Units Subcutaneous QHS  . insulin aspart  0-9 Units Subcutaneous TID WC  . isosorbide mononitrate  30 mg Oral Daily  . multivitamin  1 tablet Oral QHS  . multivitamin with minerals  1 tablet Oral Daily  . multivitamin-lutein  1 capsule Oral Daily  . sevelamer carbonate  1,600 mg Oral TID WC   acetaminophen **OR** acetaminophen, HYDROmorphone (DILAUDID) injection, metoCLOPramide (REGLAN) injection, nitroGLYCERIN, ondansetron **OR** ondansetron (ZOFRAN) IV, oxyCODONE, traZODone  Assessment/ Plan:  [name] is a 67 y.o. black female with end stage renal disease on peritoneal dialysis, hypertension, congestive heart failure, coronary artery disease status post CABG, diabetes mellitus type II insulin dependent, GERD, peripheral vascular disease admitted to [facility] on 01/29/2018 for Peripheral vascular disease ([place]) [I73.9] Right leg pain [M79.604] Left leg pain [M79.605] Ulcer of great toe, left, with unspecified severity [facility]) [L97.529]   [facility] CCPD 10 hours 5 exchanges 2 liter fills  Transition to hemodialysis.   1. End Stage Renal Disease with hyperkalemia: transitioned from PD to hemodialysis.  Right temp HD catheter.  We will plan  for hemodialysis using temporary femoral catheter tomorrow.  PermCath on Wednesday.  2.  Staphylococcus aureus bacteremia/Sepsis/ Left great toe osteomyelitis/endocarditis - PermCath has been removed Temporary dialysis catheter placed on 10/18 - Cefazolin for 6 weeks  3. Anemia of chronic kidney disease: Hemoglobin up to 8.1.  Patient will need erythropoietin stimulating agents as an outpatient.  4. Secondary Hyperparathyroidism:  -Recheck serum phosphorus tomorrow.  5. Hypertension: Hold antihypertensives before dialysis tomorrow.  She has had low blood pressure during treatments.  She may end up requiring midodrine for treatment days.  6. Peripheral vascular disease: left first toe with gangrene and pain.  - underwent limb salvage angioplasty on 01/30/18 - Left great toe amputation 02/06/2018   LOS: 11 Stephanie Ellis 10/21/20196:00 PM

## 2018-02-09 NOTE — Progress Notes (Signed)
[facility]                                                                                                                                                                                  Patient Demographics   Stephanie Ellis, is a 67 y.o. female, DOB - 04-Feb-1951, KMQ:[id number]  Admit date - 01/29/2018   Admitting Physician Amelia Jo, MD  Outpatient Primary MD for the patient is System, Pcp Not In   LOS - 11  Subjective:  The patient complains that she is hungry.  She is in n.p.o. status for permacath placement today.  But per RN, Dr. Delana Meyer rescheduled to Wednesday. Review of Systems:   CONSTITUTIONAL: No documented fever. No fatigue, weakness. No weight gain, no weight loss.  EYES: No blurry or double vision.  ENT: No tinnitus. No postnasal drip. No redness of the oropharynx.  RESPIRATORY: No cough, no wheeze, no hemoptysis. No dyspnea.  CARDIOVASCULAR: No chest pain. No orthopnea. No palpitations. No syncope.  GASTROINTESTINAL: No nausea or abdominal pain today.   GENITOURINARY: No dysuria or hematuria.  ENDOCRINE: No polyuria or nocturia. No heat or cold intolerance.  HEMATOLOGY: No anemia. No bruising. No bleeding.  INTEGUMENTARY: No rashes. No lesions.  MUSCULOSKELETAL: Left great toe amputation. NEUROLOGIC: No numbness, tingling, or ataxia. No seizure-type activity.  PSYCHIATRIC: No anxiety. No insomnia. No ADD.    Vitals:   Vitals:   02/08/18 2009 02/09/18 0424 02/09/18 0832 02/09/18 1338  BP: (!) 137/50 (!) 129/45 (!) 173/54 (!) 148/55  Pulse: 69 66 71 66  Resp: 18 20  16   Temp: 98.4 F (36.9 C) 98.3 F (36.8 C)  98.2 F (36.8 C)  TempSrc: Oral Oral  Oral  SpO2: 95% 94%  90%  Weight:  57.4 kg    Height:        Wt Readings from Last 3 Encounters:  02/09/18 57.4 kg  10/15/17 58.1 kg  10/09/17 58.1 kg     Intake/Output Summary (Last 24 hours) at 02/09/2018 1502 Last data filed at 02/09/2018 1339 Gross per 24  hour  Intake 120 ml  Output 0 ml  Net 120 ml    Physical Exam:   GENERAL: Pleasant-appearing in no apparent distress.  HEAD, EYES, EARS, NOSE AND THROAT: Atraumatic, normocephalic. Extraocular muscles are intact. Pupils equal and reactive to light. Sclerae anicteric. No conjunctival injection. No oro-pharyngeal erythema.  NECK: Supple. There is no jugular venous distention. No bruits, no lymphadenopathy, no thyromegaly.  HEART: Regular rate and rhythm,. No murmurs, no rubs, no clicks.  LUNGS: Clear to auscultation bilaterally. No rales or rhonchi. No wheezes.  ABDOMEN: Soft, flat, nontender, nondistended. Has  good bowel sounds. No hepatosplenomegaly appreciated.  EXTREMITIES: Status left great toe amputation. some blood at temporary dialysis catheter site. NEUROLOGIC: The patient is alert, awake, and oriented x3 with no focal motor or sensory deficits appreciated bilaterally.  SKIN: Moist and warm with no rashes appreciated.  Psych: Not anxious, depressed LN: No inguinal LN enlargement    Antibiotics   Anti-infectives (From admission, onward)   Start     Dose/Rate Route Frequency Ordered Stop   02/06/18 0832  ceFAZolin (ANCEF) 1-4 GM/50ML-% IVPB    Note to Pharmacy:  Norton Blizzard  : cabinet override      02/06/18 7893 02/06/18 0855   02/06/18 0831  vancomycin (VANCOCIN) 1-5 GM/200ML-% IVPB  Status:  Discontinued    Note to Pharmacy:  Norton Blizzard  : cabinet override      02/06/18 0831 02/06/18 0846   01/31/18 2200  ceFAZolin (ANCEF) IVPB 1 g/50 mL premix     1 g 100 mL/hr over 30 Minutes Intravenous Every 24 hours 01/31/18 1409     01/30/18 1800  vancomycin (VANCOCIN) IVPB 1000 mg/200 mL premix  Status:  Discontinued     1,000 mg 200 mL/hr over 60 Minutes Intravenous Every M-W-F (Hemodialysis) 01/30/18 0435 01/30/18 0906   01/30/18 1000  piperacillin-tazobactam (ZOSYN) IVPB 3.375 g  Status:  Discontinued     3.375 g 12.5 mL/hr over 240 Minutes Intravenous Every 12  hours 01/30/18 0435 01/30/18 0906   01/30/18 1000  ceFAZolin (ANCEF) 500 mg in dextrose 5 % 100 mL IVPB  Status:  Discontinued     500 mg 210 mL/hr over 30 Minutes Intravenous Every 12 hours 01/30/18 0906 01/31/18 1409   01/29/18 2200  piperacillin-tazobactam (ZOSYN) IVPB 3.375 g     3.375 g 100 mL/hr over 30 Minutes Intravenous  Once 01/29/18 2146 01/29/18 2242   01/29/18 2200  vancomycin (VANCOCIN) IVPB 1000 mg/200 mL premix     1,000 mg 200 mL/hr over 60 Minutes Intravenous  Once 01/29/18 2146 01/29/18 2314      Medications   Scheduled Meds: . acetaminophen  650 mg Oral Q8H  . aspirin EC  81 mg Oral Daily  . atorvastatin  80 mg Oral QHS  . Atropine Sulfate-NaCl  1 drop Ophthalmic BID  . bisacodyl  10 mg Rectal Daily  . carvedilol  3.125 mg Oral BID WC  . Chlorhexidine Gluconate Cloth  6 each Topical Q0600  . cholecalciferol  400 Units Oral Once per day on Mon Wed Fri  . docusate sodium  100 mg Oral BID  . epoetin (EPOGEN/PROCRIT) injection  4,000 Units Intravenous Q M,W,F-HD  . famotidine  20 mg Oral Daily  . feeding supplement (NEPRO CARB STEADY)  237 mL Oral BID BM  . insulin aspart  0-5 Units Subcutaneous QHS  . insulin aspart  0-9 Units Subcutaneous TID WC  . isosorbide mononitrate  30 mg Oral Daily  . multivitamin  1 tablet Oral QHS  . multivitamin with minerals  1 tablet Oral Daily  . multivitamin-lutein  1 capsule Oral Daily  . sevelamer carbonate  1,600 mg Oral TID WC   Continuous Infusions: .  ceFAZolin (ANCEF) IV 1 g (02/08/18 2208)  . heparin Stopped (02/06/18 0744)   PRN Meds:.acetaminophen **OR** acetaminophen, HYDROmorphone (DILAUDID) injection, metoCLOPramide (REGLAN) injection, nitroGLYCERIN, ondansetron **OR** ondansetron (ZOFRAN) IV, oxyCODONE, traZODone   Data Review:   Micro Results Recent Results (from the past 240 hour(s))  MRSA PCR Screening     Status: None   Collection  Time: 02/05/18 11:23 PM  Result Value Ref Range Status   MRSA by PCR  NEGATIVE NEGATIVE Final    Comment:        The GeneXpert MRSA Assay (FDA approved for NASAL specimens only), is one component of a comprehensive MRSA colonization surveillance program. It is not intended to diagnose MRSA infection nor to guide or monitor treatment for MRSA infections. Performed at [facility], [address] [place], [place] [address]   Aerobic/Anaerobic Culture (surgical/deep wound)     Status: None (Preliminary result)   Collection Time: 02/06/18  9:11 AM  Result Value Ref Range Status   Specimen Description TOE GREAT LEFT  Final   Special Requests   Final    NONE Performed at [facility], [address], [place], [place] [address]    Gram Stain   Final    NO WBC SEEN NO ORGANISMS SEEN Performed at [facility], [place] [address], [place], [place] [address]    Culture   Final    RARE STAPHYLOCOCCUS AUREUS NO ANAEROBES ISOLATED; CULTURE IN PROGRESS FOR 5 DAYS    Report Status PENDING  Incomplete   Organism ID, Bacteria STAPHYLOCOCCUS AUREUS  Final      Susceptibility   Staphylococcus aureus - MIC*    CIPROFLOXACIN <=0.5 SENSITIVE Sensitive     ERYTHROMYCIN <=0.25 SENSITIVE Sensitive     GENTAMICIN <=0.5 SENSITIVE Sensitive     OXACILLIN <=0.25 SENSITIVE Sensitive     TETRACYCLINE <=1 SENSITIVE Sensitive     VANCOMYCIN <=0.5 SENSITIVE Sensitive     TRIMETH/SULFA <=10 SENSITIVE Sensitive     CLINDAMYCIN <=0.25 SENSITIVE Sensitive     RIFAMPIN <=0.5 SENSITIVE Sensitive     Inducible Clindamycin NEGATIVE Sensitive     * RARE STAPHYLOCOCCUS AUREUS    Radiology Reports Dg Tibia/fibula Left  Result Date: 01/29/2018 CLINICAL DATA:  Presents with ED via ems from home Having pain to both lower legs But states left leg is worse Increased pain with palpation Decreased pulses to foot EXAM: LEFT TIBIA AND FIBULA - 2 VIEW COMPARISON:  None. FINDINGS: No fracture.  No bone lesion. Knee and ankle joints are normally aligned. There are  arterial vascular calcifications. Soft tissues are otherwise unremarkable. IMPRESSION: Negative Electronically Signed   By: Lajean Manes M.D.   On: 01/29/2018 20:58   US Venous Img Lower Unilateral Left  Result Date: 01/29/2018 CLINICAL DATA:  Left leg pain EXAM: LEFT LOWER EXTREMITY VENOUS DOPPLER ULTRASOUND TECHNIQUE: Gray-scale sonography with graded compression, as well as color Doppler and duplex ultrasound were performed to evaluate the lower extremity deep venous systems from the level of the common femoral vein and including the common femoral, femoral, profunda femoral, popliteal and calf veins including the posterior tibial, peroneal and gastrocnemius veins when visible. The superficial great saphenous vein was also interrogated. Spectral Doppler was utilized to evaluate flow at rest and with distal augmentation maneuvers in the common femoral, femoral and popliteal veins. COMPARISON:  None. FINDINGS: Contralateral Common Femoral Vein: Respiratory phasicity is normal and symmetric with the symptomatic side. No evidence of thrombus. Normal compressibility. Common Femoral Vein: No evidence of thrombus. Normal compressibility, respiratory phasicity and response to augmentation. Saphenofemoral Junction: No evidence of thrombus. Normal compressibility and flow on color Doppler imaging. Profunda Femoral Vein: No evidence of thrombus. Normal compressibility and flow on color Doppler imaging. Femoral Vein: No evidence of thrombus. Normal compressibility, respiratory phasicity and response to augmentation. Popliteal Vein: No evidence of thrombus. Normal compressibility,  respiratory phasicity and response to augmentation. Calf Veins: No evidence of thrombus. Normal compressibility and flow on color Doppler imaging. Superficial Great Saphenous Vein: No evidence of thrombus. Normal compressibility. Venous Reflux:  None. Other Findings:  None. IMPRESSION: No evidence of deep venous thrombosis. Electronically  Signed   By: Rolm Baptise M.D.   On: 01/29/2018 18:36   Dg Toe Great Left  Result Date: 01/29/2018 CLINICAL DATA:  Open wound to the left great toe for several months. EXAM: LEFT GREAT TOE COMPARISON:  10/07/2017 FINDINGS: Degenerative changes in the first metatarsal-phalangeal joint. Soft tissue defect over the plantar aspect of the left first toe. There is underlying erosion of the distal phalangeal tuft cortex suggesting focal osteomyelitis. No evidence of acute fracture or dislocation. Diffuse bone demineralization. Prominent vascular calcifications. IMPRESSION: Soft tissue defect over the plantar aspect of the left first toe with underlying erosion of the distal phalangeal tuft suggesting osteomyelitis. Electronically Signed   By: Lucienne Capers M.D.   On: 01/29/2018 21:30     CBC Recent Labs  Lab 02/05/18 0413 02/06/18 0511 02/07/18 0545 02/08/18 0439 02/09/18 0803  WBC 21.0* 23.4* 27.8* 24.8* 23.8*  HGB 9.2* 9.1* 8.8* 7.7* 8.1*  HCT 30.8* 30.7* 29.4* 26.0* 27.1*  PLT 197 224 238 232 249  MCV 99.7 99.4 99.7 100.4* 101.5*  MCH 29.8 29.4 29.8 29.7 30.3  MCHC 29.9* 29.6* 29.9* 29.6* 29.9*  RDW 19.9* 20.4* 20.8* 21.2* 21.4*    Chemistries  Recent Labs  Lab 02/04/18 0926 02/06/18 0511 02/07/18 0545 02/08/18 0439  NA 135  --  135 139  K 4.5 5.3* 5.5* 4.3  CL 97*  --  90* 96*  CO2 26  --  26 32  GLUCOSE 131*  --  187* 244*  BUN 50*  --  62* 38*  CREATININE 9.10*  --  9.29* 5.74*  CALCIUM 8.3*  --  8.6* 7.9*   ------------------------------------------------------------------------------------------------------------------ estimated creatinine clearance is 8.3 mL/min (A) (by C-G formula based on SCr of 5.74 mg/dL (H)). ------------------------------------------------------------------------------------------------------------------ No results for input(s): HGBA1C in the last 72  hours. ------------------------------------------------------------------------------------------------------------------ No results for input(s): CHOL, HDL, LDLCALC, TRIG, CHOLHDL, LDLDIRECT in the last 72 hours. ------------------------------------------------------------------------------------------------------------------ No results for input(s): TSH, T4TOTAL, T3FREE, THYROIDAB in the last 72 hours.  Invalid input(s): FREET3 ------------------------------------------------------------------------------------------------------------------ No results for input(s): VITAMINB12, FOLATE, FERRITIN, TIBC, IRON, RETICCTPCT in the last 72 hours.  Coagulation profile No results for input(s): INR, PROTIME in the last 168 hours.  No results for input(s): DDIMER in the last 72 hours.  Cardiac Enzymes No results for input(s): CKMB, TROPONINI, MYOGLOBIN in the last 168 hours.  Invalid input(s): CK ------------------------------------------------------------------------------------------------------------------ Invalid input(s): [place]  Patient admitted with sepsis   1.  Sepsis,  with MSSA with left great toe osteomyelitis, Tricuspid endocarditis.   Status post removal of her permacath by vascular and temporary dialysis catheter. Repeat blood cultures from 10/11 has been negative so far.  Still leukocytosis. Per Dr. Steva Ready, will need 6 weeks of IV Cefazolin which can be given during dialysis so she does not need another catheter  2 grams, 2grams and 3 grams until 03/13/18 with weekly CBC/CMP.  Acute ischemia of the left foot status post angioplasty of left popliteal by vascular , heparin drip was discontinued due to epistaxis.    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 Dressing changed 3 x's per week. F/u with podiatry in 2 weeks per Dr. Vickki Muff.  2.  Acute encephalopathy due to #1 now improved.   3.  PAD.,  Left leg ischemia, status post  hospitalization, continue aspirin and statins. Heparin drip was discontinued,  vascular surgery recommends Eliquis.  Hold Eliquis. Permacath placement on Wednesday.  May start Eliquis after permacath.   4.  End-stage renal disease on peritoneal dialysis.     Patient does not want peritoneal dialysis anymore. S/P right temp HD catheter and hemodialysis, and will need tunneled catheter before discharge per Dr. Juleen China.    5.  Diabetes type 2.  controlled, continue insulin with sliding scale coverage.  During the hospital stay.  Continue carb controlled renal diet.  6.  Chronic diastolic CHF, LV EF: 19% -   55%,  currently clinically compensated, continue maintenance therapy.  Continue Coreg, Imdur, midodrine.  7.  Peripheral neuropathy, continue pain medication.  #8. nausea likely secondary to sepsis/narcotic induced: And possible diabetic gastroparesis: continue Reglan, PPI, improved.  #.9. Hyperkalemia.  Discontinued ibesartan, improved with hemodialysis..  Anemia of chronic disease.  Stable.  I discussed with Dr. Holley Raring.    Code Status Orders  (From admission, onward)         Start     Ordered   01/29/18 2250  Full code  Continuous     01/29/18 2250        Code Status History    Date Active Date Inactive Code Status Order ID Comments User Context   10/24/2016 1055 10/25/2016 2243 Full Code [id number]  Hillary Bow, MD ED   10/17/2016 2143 10/19/2016 1440 Full Code [id number]  Vaughan Basta, MD Inpatient   09/04/2016 1708 09/05/2016 1500 Full Code [id number]  Epifanio Lesches, MD ED   08/22/2016 0938 08/22/2016 1833 Full Code [id number]  Algernon Huxley, MD Inpatient   06/27/2016 1121 06/27/2016 1846 Full Code [id number]  Algernon Huxley, MD Inpatient   10/03/2015 2235 10/04/2015 1724 Full Code [id number]  Harrie Foreman, MD Inpatient           Consults  NEPHROLOGY AND VASCULAR SURGERY   HEPRIN  Lab Results  Component Value Date   PLT 249 02/09/2018   High risk for  cardiac arrest secondary to multiple medical problems. Time Spent in minutes 30 MIN   Greater than 50% of time spent in care coordination and counseling patient regarding the condition and plan of care. discussed with patient's daughter bedside.  Demetrios Loll M.D on 02/09/2018 at 3:02 PM  Between 7am to 6pm - Pager - [phone]  After 6pm go to [email or web address] - Proofreader  [employer]   Office  [phone]

## 2018-02-09 NOTE — Care Management (Signed)
Barrier- patient is transitioning from  PD to HD. Has chair at [facility] 11A. Vascular to is replace the perm cath that was removed due to concern for  source of bacteremia. Spoke with vascular who  will make every effort to get patient on the schedule for 10/22-but the schedule is looking full.   Procedure will be performed no later than 10/23.  Per primary nurse, patient has not been out of bed for the past week due to temporary dialysis cath being placed in the groin.  Discussed the need to mobilize patient and that she must have documentation that she is able to sit for the duration of a

## 2018-02-09 NOTE — Consult Note (Signed)
ANTICOAGULATION CONSULT NOTE - Initial Consult  Pharmacy Consult for heparin infusion Indication: PAD  Allergies  Allergen Reactions  . Chlorthalidone Anaphylaxis, Itching and Rash  . Fentanyl Rash  . Midazolam Rash  . Ace Inhibitors Other (See Comments)    Reaction:  Hyperkalemia, agitation   . Angiotensin Receptor Blockers Other (See Comments)    Reaction:  Hyperkalemia, agitation   . Norvasc [Amlodipine] Itching and Rash  . Phenergan [Promethazine Hcl] Anxiety    "antsy, can't sit still"    Patient Measurements: Height: 5' 4"  (162.6 cm) Weight: 126 lb 8.7 oz (57.4 kg) IBW/kg (Calculated) : 54.7 Heparin Dosing Weight: 56.7  Vital Signs: Temp: 98.2 F (36.8 C) (10/21 1338) Temp Source: Oral (10/21 1338) BP: 148/55 (10/21 1338) Pulse Rate: 66 (10/21 1338)  Labs: Recent Labs    02/07/18 0545 02/08/18 0439 02/09/18 0803  HGB 8.8* 7.7* 8.1*  HCT 29.4* 26.0* 27.1*  PLT 238 232 249  HEPARINUNFRC <0.10*  --   --   CREATININE 9.29* 5.74*  --     Estimated Creatinine Clearance: 8.3 mL/min (A) (by C-G formula based on SCr of 5.74 mg/dL (H)).   Medical History: Past Medical History:  Diagnosis Date  . (HFpEF) heart failure with preserved ejection fraction ([place])    a. 01/2018 Echo: EF 50-55%, no rwma, Gr1 DD, triv AI, mild MR. Midly dil LA/RV, mod reduced RV fxn. Irreg thickening of TV w/ mobile echodensity that appears to arise from valve.  . Anemia   . Anorexia   . Bacteremia due to Pseudomonas   . Carotid arterial disease ([place])    a. 01/2017 Carotid U/S: RICA <32, LICA <35.  Marland Kitchen Complication of anesthesia    a. Pt reports h/o complication on 9 different occasions - ? hypotension/arrest.  . 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 01/2015 [facility]): 70% LM, p-mLAD 50-60% (Resting FFR 0.75), mRCA 80-90%, ~40 Ost OM & D1-->CABG; b. 01/2016 Staged PCI of LCX x 2 and RCA.  Marland Kitchen ESRD (end stage renal disease) on dialysis ([place])    a. ESRD  secondary to acute kidney failure s/p CABG-->PD  . Essential hypertension   . GERD (gastroesophageal reflux disease)   . Heart murmur   . Hypercholesterolemia   . Myocardial infarction ([place]) 2016  . PVD (peripheral vascular disease) ([place])    a. 01/30/2018 PV Angio: Sev Left Popliteal dzs s/p PTA w/ 65m lutonix DEB w/ mech aspiration of the L popliteal, L AT, and Left tibioperoneal trunck and peroneal artery.  . 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  [facility]: Dr. BMarland KitchenHaithcock: CABG x 3, LIMA to LAD, SVG to RCA, SVG to OM3, EVH  . Sinusitis 2019  . Type II diabetes mellitus with complication (HCC)    CAD    Medications:    Assessment: 89yof now s/p peripheral vascular catheterization with arterial embolism. Pharmacy consulted to dose heparin. No initial bolus due to receiving heparin 5000 units IV x 1 in angio suite per Dr. SDelana Meyer Hemoglobin is trending down following limb salvage angioplasty 10/11 but stabilizing.Epo ordered with HD  Heparin was started at 800 units/hr 10/12 0351HL 0.47:  10/12 1307HL 0.46 10/13 0324HL 0.30: rate increased to 850 units/hr 10/13 1200HL 0.52 10/13 1957HL 0.60 10/14 0410HL 0.59 10/15 AMHL 0.85: decrease rate to 750 units/hr 10/15 1516 HL 0.81: decrease rate to 650 units/hr 10/16 0100 HL 0.54 10/16 0926 HL 1.01: question validity of value, order placed for redraw  but patient refused and then went to vascular lab  On 10/18 Infusion was stopped due to epistaxis.  Patient now to be restarted on heparin infusion.  Goal of Therapy:  Heparin level 0.3-0.7 units/ml Monitor platelets by anticoagulation protocol: Yes   Plan:  Give 2800 units bolus x 1 due to recent epistaxis bolus dosed on the lower end of dosing range. Start heparin infusion at 650 units/hr which was previous rate needed to achieve heparin level goal. Will draw heparin level every 8 hours and daily CBC Continue to monitor H&H and platelets  Forrest Moron,  PharmD 02/09/2018,6:27 PM

## 2018-02-09 NOTE — Care Management Important Message (Signed)
Copy of signed IM left with patient in room.  

## 2018-02-09 NOTE — Clinical Social Work Note (Signed)
CSW spoke with patient's daughter [name] [phone] to discuss discharge plan. Patient's daughter accepted bed offer from [facility]. CSW notified Magda Paganini at [facility] of bed acceptance. CSW will continue to follow for discharge planning.   [place], [place]

## 2018-02-10 ENCOUNTER — Encounter: Payer: Self-pay | Admitting: Vascular Surgery

## 2018-02-10 LAB — GLUCOSE, CAPILLARY
Glucose-Capillary: 208 mg/dL — ABNORMAL HIGH (ref 70–99)
Glucose-Capillary: 137 mg/dL — ABNORMAL HIGH (ref 70–99)
Glucose-Capillary: 148 mg/dL — ABNORMAL HIGH (ref 70–99)
Glucose-Capillary: 189 mg/dL — ABNORMAL HIGH (ref 70–99)

## 2018-02-10 LAB — CBC
HCT: 26.1 % — ABNORMAL LOW (ref 36.0–46.0)
Hemoglobin: 7.9 g/dL — ABNORMAL LOW (ref 12.0–15.0)
MCH: 30.6 pg (ref 26.0–34.0)
MCHC: 30.3 g/dL (ref 30.0–36.0)
MCV: 101.2 fL — ABNORMAL HIGH (ref 80.0–100.0)
Platelets: 276 10*3/uL (ref 150–400)
RBC: 2.58 MIL/uL — ABNORMAL LOW (ref 3.87–5.11)
RDW: 21.8 % — ABNORMAL HIGH (ref 11.5–15.5)
WBC: 23.6 10*3/uL — ABNORMAL HIGH (ref 4.0–10.5)
nRBC: 0.1 % (ref 0.0–0.2)

## 2018-02-10 LAB — HEPARIN LEVEL (UNFRACTIONATED)
Heparin Unfractionated: 0.35 IU/mL (ref 0.30–0.70)
Heparin Unfractionated: 0.41 IU/mL (ref 0.30–0.70)

## 2018-02-10 LAB — SURGICAL PATHOLOGY

## 2018-02-10 MED ORDER — CEFAZOLIN SODIUM-DEXTROSE 1-4 GM/50ML-% IV SOLN
1.0000 g | INTRAVENOUS | Status: AC
  Filled 2018-02-10: qty 50

## 2018-02-10 MED ORDER — CALCITRIOL 0.25 MCG PO CAPS
0.5000 ug | ORAL_CAPSULE | Freq: Every day | ORAL | Status: DC
  Administered 2018-02-12 – 2018-02-15 (×3): 0.5 ug via ORAL
  Filled 2018-02-10 (×5): qty 2

## 2018-02-10 MED ORDER — MIRTAZAPINE 15 MG PO TABS
7.5000 mg | ORAL_TABLET | Freq: Every day | ORAL | Status: DC
  Administered 2018-02-11 – 2018-02-14 (×4): 7.5 mg via ORAL
  Filled 2018-02-10 (×5): qty 1

## 2018-02-10 NOTE — Progress Notes (Signed)
Daily Progress Note   Subjective  - 4 Days Post-Op  F/u great toe amputation.    Objective Vitals:   02/10/18 1043 02/10/18 1045 02/10/18 1116 02/10/18 1243  BP:  (!) 157/59 (!) 148/53 (!) 155/55  Pulse:   68 63  Resp: 12 16 16 18   Temp:   97.7 F (36.5 C) 98.1 F (36.7 C)  TempSrc:   Oral Oral  SpO2:   99%   Weight:      Height:        Physical Exam: Dressing changed and surgical site skin flaps doing well.  Small dorsal necrosis at previous dark discolored blister.  Still some residual superficial skin blistering to left foot note. Small at this time  Laboratory CBC    Component Value Date/Time   WBC 23.6 (H) 02/10/2018 0308   HGB 7.9 (L) 02/10/2018 0308   HCT 26.1 (L) 02/10/2018 0308   PLT 276 02/10/2018 0308    BMET    Component Value Date/Time   NA 139 02/08/2018 0439   K 4.3 02/08/2018 0439   CL 96 (L) 02/08/2018 0439   CO2 32 02/08/2018 0439   GLUCOSE 244 (H) 02/08/2018 0439   BUN 38 (H) 02/08/2018 0439   CREATININE 5.74 (H) 02/08/2018 0439   CALCIUM 7.9 (L) 02/08/2018 0439   GFRNONAA 7 (L) 02/08/2018 0439   GFRAA 8 (L) 02/08/2018 0439    Assessment/Planning: Gangrene left great toe   Dressing changed and will have nursing begin dressing changes.  F/u with podiatry in 1-2 weeks.  Can follow peripherally and can be re-consulted as needed.  ABX per ID.  Minimal wb to left foot for transfer.  Stephanie Ellis A  02/10/2018, 1:09 PM

## 2018-02-10 NOTE — Progress Notes (Signed)
Post HD assessment    02/10/18 1041  Neurological  Level of Consciousness Alert  Orientation Level Oriented to person  Respiratory  Respiratory Pattern Regular;Unlabored  Chest Assessment Chest expansion symmetrical  Cardiac  ECG Monitor Yes  Cardiac Rhythm NSR  Vascular  R Radial Pulse +2  L Radial Pulse +1  Edema Left lower extremity  Integumentary  Integumentary (WDL) X  Skin Color Appropriate for ethnicity  Musculoskeletal  Musculoskeletal (WDL) X  Generalized Weakness Yes  Assistive Device None  GU Assessment  Genitourinary (WDL) X  Genitourinary Symptoms  (HD)  Psychosocial  Psychosocial (WDL) X  Patient Behaviors Not interactive

## 2018-02-10 NOTE — Progress Notes (Signed)
[place] [facility]                                                                                                                                                                                  Patient Demographics   Stephanie Ellis, is a 67 y.o. female, DOB - 05-23-1950, QBH:[id number]  Admit date - 01/29/2018   Admitting Physician Amelia Jo, MD  Outpatient Primary MD for the patient is System, Pcp Not In   LOS - 12  Subjective:  The patient vomited once this morning.  She could not finish hemodialysis due to hypotension. Review of Systems:   CONSTITUTIONAL: No documented fever. No fatigue, weakness. No weight gain, no weight loss.  EYES: No blurry or double vision.  ENT: No tinnitus. No postnasal drip. No redness of the oropharynx.  RESPIRATORY: No cough, no wheeze, no hemoptysis. No dyspnea.  CARDIOVASCULAR: No chest pain. No orthopnea. No palpitations. No syncope.  GASTROINTESTINAL: Had nausea and vomiting, no abdominal pain.   GENITOURINARY: No dysuria or hematuria.  ENDOCRINE: No polyuria or nocturia. No heat or cold intolerance.  HEMATOLOGY: No anemia. No bruising. No bleeding.  INTEGUMENTARY: No rashes. No lesions.  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 NEUROLOGIC: No numbness, tingling, or ataxia. No seizure-type activity.  PSYCHIATRIC: No anxiety. No insomnia. No ADD.    Vitals:   Vitals:   02/10/18 1045 02/10/18 1116 02/10/18 1243 02/10/18 1424  BP: (!) 157/59 (!) 148/53 (!) 155/55 (!) 122/52  Pulse:  68 63 66  Resp: 16 16 18 16   Temp:  97.7 F (36.5 C) 98.1 F (36.7 C)   TempSrc:  Oral Oral   SpO2:  99%  96%  Weight:      Height:        Wt Readings from Last 3 Encounters:  02/10/18 57.5 kg  10/15/17 58.1 kg  10/09/17 58.1 kg     Intake/Output Summary (Last 24 hours) at 02/10/2018 1604 Last data filed at 02/10/2018 1038 Gross per 24 hour  Intake 480 ml  Output -613 ml  Net 1093 ml     Physical Exam:   GENERAL: Pleasant-appearing in no apparent distress.  HEAD, EYES, EARS, NOSE AND THROAT: Atraumatic, normocephalic. Extraocular muscles are intact. Pupils equal and reactive to light. Sclerae anicteric. No conjunctival injection. No oro-pharyngeal erythema.  NECK: Supple. There is no jugular venous distention. No bruits, no lymphadenopathy, no thyromegaly.  HEART: Regular rate and rhythm,. No murmurs, no rubs, no clicks.  LUNGS: Clear to auscultation bilaterally. No rales or rhonchi. No wheezes.  ABDOMEN: Soft, flat, nontender, nondistended. Has good bowel sounds. No hepatosplenomegaly appreciated.  EXTREMITIES: Status left great toe  amputation in dressing. NEUROLOGIC: The patient is alert, awake, and oriented x3 with no focal motor or sensory deficits appreciated bilaterally.  SKIN: Moist and warm with no rashes appreciated.  Psych: Not anxious, depressed LN: No inguinal LN enlargement    Antibiotics   Anti-infectives (From admission, onward)   Start     Dose/Rate Route Frequency Ordered Stop   02/06/18 0832  ceFAZolin (ANCEF) 1-4 GM/50ML-% IVPB    Note to Pharmacy:  Norton Blizzard  : cabinet override      02/06/18 6759 02/06/18 0855   02/06/18 0831  vancomycin (VANCOCIN) 1-5 GM/200ML-% IVPB  Status:  Discontinued    Note to Pharmacy:  Norton Blizzard  : cabinet override      02/06/18 0831 02/06/18 0846   01/31/18 2200  ceFAZolin (ANCEF) IVPB 1 g/50 mL premix     1 g 100 mL/hr over 30 Minutes Intravenous Every 24 hours 01/31/18 1409     01/30/18 1800  vancomycin (VANCOCIN) IVPB 1000 mg/200 mL premix  Status:  Discontinued     1,000 mg 200 mL/hr over 60 Minutes Intravenous Every M-W-F (Hemodialysis) 01/30/18 0435 01/30/18 0906   01/30/18 1000  piperacillin-tazobactam (ZOSYN) IVPB 3.375 g  Status:  Discontinued     3.375 g 12.5 mL/hr over 240 Minutes Intravenous Every 12 hours 01/30/18 0435 01/30/18 0906   01/30/18 1000  ceFAZolin (ANCEF) 500 mg in  dextrose 5 % 100 mL IVPB  Status:  Discontinued     500 mg 210 mL/hr over 30 Minutes Intravenous Every 12 hours 01/30/18 0906 01/31/18 1409   01/29/18 2200  piperacillin-tazobactam (ZOSYN) IVPB 3.375 g     3.375 g 100 mL/hr over 30 Minutes Intravenous  Once 01/29/18 2146 01/29/18 2242   01/29/18 2200  vancomycin (VANCOCIN) IVPB 1000 mg/200 mL premix     1,000 mg 200 mL/hr over 60 Minutes Intravenous  Once 01/29/18 2146 01/29/18 2314      Medications   Scheduled Meds: . acetaminophen  650 mg Oral Q8H  . aspirin EC  81 mg Oral Daily  . atorvastatin  80 mg Oral QHS  . bisacodyl  10 mg Rectal Daily  . [START ON 02/11/2018] calcitRIOL  0.5 mcg Oral Q breakfast  . carvedilol  3.125 mg Oral BID WC  . Chlorhexidine Gluconate Cloth  6 each Topical Q0600  . cholecalciferol  400 Units Oral Once per day on Mon Wed Fri  . docusate sodium  100 mg Oral BID  . epoetin (EPOGEN/PROCRIT) injection  4,000 Units Intravenous Q M,W,F-HD  . famotidine  20 mg Oral Daily  . feeding supplement (NEPRO CARB STEADY)  237 mL Oral BID BM  . insulin aspart  0-5 Units Subcutaneous QHS  . insulin aspart  0-9 Units Subcutaneous TID WC  . isosorbide mononitrate  30 mg Oral Daily  . mirtazapine  7.5 mg Oral QHS  . multivitamin  1 tablet Oral QHS  . multivitamin with minerals  1 tablet Oral Daily  . multivitamin-lutein  1 capsule Oral Daily  . sevelamer carbonate  1,600 mg Oral TID WC   Continuous Infusions: .  ceFAZolin (ANCEF) IV 1 g (02/09/18 2255)  . heparin 650 Units/hr (02/09/18 1941)   PRN Meds:.acetaminophen **OR** acetaminophen, HYDROmorphone (DILAUDID) injection, metoCLOPramide (REGLAN) injection, nitroGLYCERIN, ondansetron **OR** ondansetron (ZOFRAN) IV, oxyCODONE, traZODone   Data Review:   Micro Results Recent Results (from the past 240 hour(s))  MRSA PCR Screening     Status: None   Collection Time: 02/05/18 11:23 PM  Result Value Ref Range Status   MRSA by PCR NEGATIVE NEGATIVE Final     Comment:        The GeneXpert MRSA Assay (FDA approved for NASAL specimens only), is one component of a comprehensive MRSA colonization surveillance program. It is not intended to diagnose MRSA infection nor to guide or monitor treatment for MRSA infections. Performed at [facility], [address] [place], [place] [address]   Aerobic/Anaerobic Culture (surgical/deep wound)     Status: None (Preliminary result)   Collection Time: 02/06/18  9:11 AM  Result Value Ref Range Status   Specimen Description TOE GREAT LEFT  Final   Special Requests   Final    NONE Performed at [facility], [address] [place], [place] [address]    Gram Stain   Final    NO WBC SEEN NO ORGANISMS SEEN Performed at [facility], [place] [address], [place], [place] [address]    Culture   Final    RARE STAPHYLOCOCCUS AUREUS NO ANAEROBES ISOLATED; CULTURE IN PROGRESS FOR 5 DAYS    Report Status PENDING  Incomplete   Organism ID, Bacteria STAPHYLOCOCCUS AUREUS  Final      Susceptibility   Staphylococcus aureus - MIC*    CIPROFLOXACIN <=0.5 SENSITIVE Sensitive     ERYTHROMYCIN <=0.25 SENSITIVE Sensitive     GENTAMICIN <=0.5 SENSITIVE Sensitive     OXACILLIN <=0.25 SENSITIVE Sensitive     TETRACYCLINE <=1 SENSITIVE Sensitive     VANCOMYCIN <=0.5 SENSITIVE Sensitive     TRIMETH/SULFA <=10 SENSITIVE Sensitive     CLINDAMYCIN <=0.25 SENSITIVE Sensitive     RIFAMPIN <=0.5 SENSITIVE Sensitive     Inducible Clindamycin NEGATIVE Sensitive     * RARE STAPHYLOCOCCUS AUREUS    Radiology Reports Dg Tibia/fibula Left  Result Date: 01/29/2018 CLINICAL DATA:  Presents with ED via ems from home Having pain to both lower legs But states left leg is worse Increased pain with palpation Decreased pulses to foot EXAM: LEFT TIBIA AND FIBULA - 2 VIEW COMPARISON:  None. FINDINGS: No fracture.  No bone lesion. Knee and ankle joints are normally aligned. There are arterial vascular  calcifications. Soft tissues are otherwise unremarkable. IMPRESSION: Negative Electronically Signed   By: Lajean Manes M.D.   On: 01/29/2018 20:58   US Venous Img Lower Unilateral Left  Result Date: 01/29/2018 CLINICAL DATA:  Left leg pain EXAM: LEFT LOWER EXTREMITY VENOUS DOPPLER ULTRASOUND TECHNIQUE: Gray-scale sonography with graded compression, as well as color Doppler and duplex ultrasound were performed to evaluate the lower extremity deep venous systems from the level of the common femoral vein and including the common femoral, femoral, profunda femoral, popliteal and calf veins including the posterior tibial, peroneal and gastrocnemius veins when visible. The superficial great saphenous vein was also interrogated. Spectral Doppler was utilized to evaluate flow at rest and with distal augmentation maneuvers in the common femoral, femoral and popliteal veins. COMPARISON:  None. FINDINGS: Contralateral Common Femoral Vein: Respiratory phasicity is normal and symmetric with the symptomatic side. No evidence of thrombus. Normal compressibility. Common Femoral Vein: No evidence of thrombus. Normal compressibility, respiratory phasicity and response to augmentation. Saphenofemoral Junction: No evidence of thrombus. Normal compressibility and flow on color Doppler imaging. Profunda Femoral Vein: No evidence of thrombus. Normal compressibility and flow on color Doppler imaging. Femoral Vein: No evidence of thrombus. Normal compressibility, respiratory phasicity and response to augmentation. Popliteal Vein: No evidence of thrombus. Normal compressibility, respiratory phasicity and response to  augmentation. Calf Veins: No evidence of thrombus. Normal compressibility and flow on color Doppler imaging. Superficial Great Saphenous Vein: No evidence of thrombus. Normal compressibility. Venous Reflux:  None. Other Findings:  None. IMPRESSION: No evidence of deep venous thrombosis. Electronically Signed   By: Rolm Baptise M.D.   On: 01/29/2018 18:36   Dg Toe Great Left  Result Date: 01/29/2018 CLINICAL DATA:  Open wound to the left great toe for several months. EXAM: LEFT GREAT TOE COMPARISON:  10/07/2017 FINDINGS: Degenerative changes in the first metatarsal-phalangeal joint. Soft tissue defect over the plantar aspect of the left first toe. There is underlying erosion of the distal phalangeal tuft cortex suggesting focal osteomyelitis. No evidence of acute fracture or dislocation. Diffuse bone demineralization. Prominent vascular calcifications. IMPRESSION: Soft tissue defect over the plantar aspect of the left first toe with underlying erosion of the distal phalangeal tuft suggesting osteomyelitis. Electronically Signed   By: Lucienne Capers M.D.   On: 01/29/2018 21:30     CBC Recent Labs  Lab 02/06/18 0511 02/07/18 0545 02/08/18 0439 02/09/18 0803 02/10/18 0308  WBC 23.4* 27.8* 24.8* 23.8* 23.6*  HGB 9.1* 8.8* 7.7* 8.1* 7.9*  HCT 30.7* 29.4* 26.0* 27.1* 26.1*  PLT 224 238 232 249 276  MCV 99.4 99.7 100.4* 101.5* 101.2*  MCH 29.4 29.8 29.7 30.3 30.6  MCHC 29.6* 29.9* 29.6* 29.9* 30.3  RDW 20.4* 20.8* 21.2* 21.4* 21.8*    Chemistries  Recent Labs  Lab 02/04/18 0926 02/06/18 0511 02/07/18 0545 02/08/18 0439  NA 135  --  135 139  K 4.5 5.3* 5.5* 4.3  CL 97*  --  90* 96*  CO2 26  --  26 32  GLUCOSE 131*  --  187* 244*  BUN 50*  --  62* 38*  CREATININE 9.10*  --  9.29* 5.74*  CALCIUM 8.3*  --  8.6* 7.9*   ------------------------------------------------------------------------------------------------------------------ estimated creatinine clearance is 8.3 mL/min (A) (by C-G formula based on SCr of 5.74 mg/dL (H)). ------------------------------------------------------------------------------------------------------------------ No results for input(s): HGBA1C in the last 72  hours. ------------------------------------------------------------------------------------------------------------------ No results for input(s): CHOL, HDL, LDLCALC, TRIG, CHOLHDL, LDLDIRECT in the last 72 hours. ------------------------------------------------------------------------------------------------------------------ No results for input(s): TSH, T4TOTAL, T3FREE, THYROIDAB in the last 72 hours.  Invalid input(s): FREET3 ------------------------------------------------------------------------------------------------------------------ No results for input(s): VITAMINB12, FOLATE, FERRITIN, TIBC, IRON, RETICCTPCT in the last 72 hours.  Coagulation profile No results for input(s): INR, PROTIME in the last 168 hours.  No results for input(s): DDIMER in the last 72 hours.  Cardiac Enzymes No results for input(s): CKMB, TROPONINI, MYOGLOBIN in the last 168 hours.  Invalid input(s): CK ------------------------------------------------------------------------------------------------------------------ Invalid input(s): [place]  Patient admitted with sepsis   1.  Sepsis,  with MSSA with left great toe osteomyelitis, Tricuspid endocarditis.   Status post removal of her permacath by vascular and temporary dialysis catheter. Repeat blood cultures from 10/11 has been negative so far.  Still leukocytosis. Per Dr. Steva Ready, will need 6 weeks of IV Cefazolin which can be given during dialysis so she does not need another catheter  2 grams, 2grams and 3 grams until 03/13/18 with weekly CBC/CMP.  Acute ischemia of the left foot status post angioplasty of left popliteal by vascular , heparin drip was discontinued due to epistaxis.  Resumed Heparin drip. No active bleeding.  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 Dressing changed 3 x's per week. F/u with podiatry in 2 weeks per Dr. Vickki Muff.  2.  Acute encephalopathy due to #1 now improved.   3.   PAD.,  Left leg ischemia, status post hospitalization, continue aspirin and statins. Heparin drip is restarted,  vascular surgery recommends Eliquis.  Hold Eliquis. Permacath placement tomorrow  May start Eliquis after permacath.   4.  End-stage renal disease on peritoneal dialysis.     Patient does not want peritoneal dialysis anymore. S/P right temp HD catheter and hemodialysis, Permacath placement tomorrow.   5.  Diabetes type 2.  controlled, continue insulin with sliding scale coverage. Continue carb controlled renal diet.  6.  Chronic diastolic CHF, LV EF: 67% -   55%,  currently clinically compensated, continue maintenance therapy.  Continue Coreg, Imdur, midodrine.  7.  Peripheral neuropathy, continue pain medication.  #8. nausea likely secondary to sepsis/narcotic induced: And possible diabetic gastroparesis: continue Reglan, PPI, improved.  #.9. Hyperkalemia.  Discontinued ibesartan, improved with hemodialysis..  Anemia of chronic disease.  Stable.  I discussed with Dr. Delana Meyer.    Code Status Orders  (From admission, onward)         Start     Ordered   01/29/18 2250  Full code  Continuous     01/29/18 2250        Code Status History    Date Active Date Inactive Code Status Order ID Comments User Context   10/24/2016 1055 10/25/2016 2243 Full Code [id number]  Hillary Bow, MD ED   10/17/2016 2143 10/19/2016 1440 Full Code [id number]  Vaughan Basta, MD Inpatient   09/04/2016 1708 09/05/2016 1500 Full Code [id number]  Epifanio Lesches, MD ED   08/22/2016 0938 08/22/2016 1833 Full Code [id number]  Algernon Huxley, MD Inpatient   06/27/2016 1121 06/27/2016 1846 Full Code [id number]  Algernon Huxley, MD Inpatient   10/03/2015 2235 10/04/2015 1724 Full Code [id number]  Harrie Foreman, MD Inpatient           Consults  NEPHROLOGY AND VASCULAR SURGERY   HEPRIN  Lab Results  Component Value Date   PLT 276 02/10/2018   High risk for cardiac arrest secondary to multiple  medical problems. Time Spent in minutes 26 MIN   Greater than 50% of time spent in care coordination and counseling patient regarding the condition and plan of care.  Demetrios Loll M.D on 02/10/2018 at 4:04 PM  Between 7am to 6pm - Pager - [phone]  After 6pm go to [email or web address] - Proofreader  [employer]   Office  [phone]

## 2018-02-10 NOTE — Progress Notes (Signed)
[facility]  ROUNDING NOTE   Subjective:  Dialysis treatment had to be cut short today as the patient developed unresponsiveness and hypotension during the treatment. She was given a small fluid bolus and unresponsiveness improved.    Objective:  Vital signs in last 24 hours:  Temp:  [97.7 F (36.5 C)-98.8 F (37.1 C)] 98.1 F (36.7 C) (10/22 1243) Pulse Rate:  [60-81] 66 (10/22 1424) Resp:  [12-58] 16 (10/22 1424) BP: (84-158)/(46-59) 122/52 (10/22 1424) SpO2:  [82 %-100 %] 96 % (10/22 1424) Weight:  [57.5 kg] 57.5 kg (10/22 1012)  Weight change: 0.1 kg Filed Weights   02/09/18 0424 02/10/18 0441 02/10/18 1012  Weight: 57.4 kg 57.5 kg 57.5 kg    Intake/Output: I/O last 3 completed shifts: In: 600 [P.O.:600] Out: 0    Intake/Output this shift:  Total I/O In: -  Out: -613   Physical Exam: General: NAD, laying in bed, chronically ill-appearing  Head: Normocephalic, atraumatic. Moist oral mucosal membranes  Eyes: Anicteric   Neck: Supple  Lungs:  Clear to auscultation  Heart: Regular rate and rhythm  Abdomen:  Soft, nontender, BS present  Extremities: Trace LE edema  Neurologic: Awake, but slow to respnd  Access: PD catheter, right femoral temp catheter Dr. Delana Meyer    Basic Metabolic Panel: Recent Labs  Lab 02/04/18 0926 02/06/18 0511 02/07/18 0545 02/08/18 0439  NA 135  --  135 139  K 4.5 5.3* 5.5* 4.3  CL 97*  --  90* 96*  CO2 26  --  26 32  GLUCOSE 131*  --  187* 244*  BUN 50*  --  62* 38*  CREATININE 9.10*  --  9.29* 5.74*  CALCIUM 8.3*  --  8.6* 7.9*  PHOS 4.3  --   --   --     Liver Function Tests: Recent Labs  Lab 02/04/18 0926  ALBUMIN 2.0*   No results for input(s): LIPASE, AMYLASE in the last 168 hours. No results for input(s): AMMONIA in the last 168 hours.  CBC: Recent Labs  Lab 02/06/18 0511 02/07/18 0545 02/08/18 0439 02/09/18 0803 02/10/18 0308  WBC 23.4* 27.8* 24.8* 23.8* 23.6*  HGB 9.1* 8.8* 7.7* 8.1*  7.9*  HCT 30.7* 29.4* 26.0* 27.1* 26.1*  MCV 99.4 99.7 100.4* 101.5* 101.2*  PLT 224 238 232 249 276    Cardiac Enzymes: No results for input(s): CKTOTAL, CKMB, CKMBINDEX, TROPONINI in the last 168 hours.  BNP: Invalid input(s): POCBNP  CBG: Recent Labs  Lab 02/09/18 1131 02/09/18 1655 02/09/18 2122 02/10/18 0750 02/10/18 1121  GLUCAP 116* 194* 213* 208* 137*    Microbiology: Results for orders placed or performed during the hospital encounter of 01/29/18  Blood Culture (routine x 2)     Status: Abnormal   Collection Time: 01/29/18  9:55 PM  Result Value Ref Range Status   Specimen Description   Final    BLOOD RIGHT ANTECUBITAL Performed at [facility], [address] [place], [place] [address]    Special Requests   Final    BOTTLES DRAWN AEROBIC AND ANAEROBIC Blood Culture adequate volume Performed at [facility], [address], [place], [place] [address]    Culture  Setup Time   Final    GRAM POSITIVE COCCI IN CLUSTERS IN BOTH AEROBIC AND ANAEROBIC BOTTLES CRITICAL RESULT CALLED TO, READ BACK BY AND VERIFIED WITH: KAREN HAYES 01/30/18 0839 JGF Performed at [facility], [address], [place], [place] [address]    Culture (  A)  Final    STAPHYLOCOCCUS AUREUS SUSCEPTIBILITIES PERFORMED ON PREVIOUS CULTURE WITHIN THE LAST 5 DAYS. Performed at [facility], [place] [address] [place], [place] [address]    Report Status 02/01/2018 FINAL  Final  Blood Culture (routine x 2)     Status: Abnormal   Collection Time: 01/29/18  9:55 PM  Result Value Ref Range Status   Specimen Description   Final    BLOOD RIGHT ANTECUBITAL Performed at [facility], [address] [place], [place] [address]    Special Requests   Final    BOTTLES DRAWN AEROBIC AND ANAEROBIC Blood Culture adequate volume Performed at [facility], [address] [place], [place] [address]    Culture  Setup Time   Final    GRAM POSITIVE COCCI  IN CLUSTERS IN BOTH AEROBIC AND ANAEROBIC BOTTLES CRITICAL RESULT CALLED TO, READ BACK BY AND VERIFIED WITH: KAREN HAYES 01/30/18 0839 JGF Performed at [facility], [place] [address] [place], [place] [address]    Culture STAPHYLOCOCCUS AUREUS (A)  Final   Report Status 02/01/2018 FINAL  Final   Organism ID, Bacteria STAPHYLOCOCCUS AUREUS  Final      Susceptibility   Staphylococcus aureus - MIC*    CIPROFLOXACIN <=0.5 SENSITIVE Sensitive     ERYTHROMYCIN <=0.25 SENSITIVE Sensitive     GENTAMICIN <=0.5 SENSITIVE Sensitive     OXACILLIN 0.5 SENSITIVE Sensitive     TETRACYCLINE <=1 SENSITIVE Sensitive     VANCOMYCIN <=0.5 SENSITIVE Sensitive     TRIMETH/SULFA <=10 SENSITIVE Sensitive     CLINDAMYCIN <=0.25 SENSITIVE Sensitive     RIFAMPIN <=0.5 SENSITIVE Sensitive     Inducible Clindamycin NEGATIVE Sensitive     * STAPHYLOCOCCUS AUREUS  Blood Culture ID Panel (Reflexed)     Status: Abnormal   Collection Time: 01/29/18  9:55 PM  Result Value Ref Range Status   Enterococcus species NOT DETECTED NOT DETECTED Final   Vancomycin resistance NOT DETECTED NOT DETECTED Final   Listeria monocytogenes NOT DETECTED NOT DETECTED Final   Staphylococcus species DETECTED (A) NOT DETECTED Final    Comment: CRITICAL RESULT CALLED TO, READ BACK BY AND VERIFIED WITH: KAREN HAYES 01/30/18 0839 JGF    Staphylococcus aureus (BCID) DETECTED (A) NOT DETECTED Final    Comment: CRITICAL RESULT CALLED TO, READ BACK BY AND VERIFIED WITH: KAREN HAYES 01/30/18 0839 JGF Methicillin (oxacillin) susceptible Staphylococcus aureus (MSSA). Preferred therapy is anti staphylococcal beta lactam antibiotic (Cefazolin or Nafcillin), unless clinically contraindicated.    Methicillin resistance NOT DETECTED NOT DETECTED Final   Streptococcus species NOT DETECTED NOT DETECTED Final   Streptococcus agalactiae NOT DETECTED NOT DETECTED Final   Streptococcus pneumoniae NOT DETECTED NOT DETECTED Final   Streptococcus  pyogenes NOT DETECTED NOT DETECTED Final   Acinetobacter baumannii NOT DETECTED NOT DETECTED Final   Enterobacteriaceae species NOT DETECTED NOT DETECTED Final   Enterobacter cloacae complex NOT DETECTED NOT DETECTED Final   Escherichia coli NOT DETECTED NOT DETECTED Final   Klebsiella oxytoca NOT DETECTED NOT DETECTED Final   Klebsiella pneumoniae NOT DETECTED NOT DETECTED Final   Proteus species NOT DETECTED NOT DETECTED Final   Serratia marcescens NOT DETECTED NOT DETECTED Final   Carbapenem resistance NOT DETECTED NOT DETECTED Final   Haemophilus influenzae NOT DETECTED NOT DETECTED Final   Neisseria meningitidis NOT DETECTED NOT DETECTED Final   Pseudomonas aeruginosa NOT DETECTED NOT DETECTED Final   Candida albicans NOT DETECTED NOT DETECTED Final   Candida glabrata  NOT DETECTED NOT DETECTED Final   Candida krusei NOT DETECTED NOT DETECTED Final   Candida parapsilosis NOT DETECTED NOT DETECTED Final   Candida tropicalis NOT DETECTED NOT DETECTED Final    Comment: Performed at [facility], [address] [place], [place] [address]  MRSA PCR Screening     Status: None   Collection Time: 01/29/18 10:55 PM  Result Value Ref Range Status   MRSA by PCR NEGATIVE NEGATIVE Final    Comment:        The GeneXpert MRSA Assay (FDA approved for NASAL specimens only), is one component of a comprehensive MRSA colonization surveillance program. It is not intended to diagnose MRSA infection nor to guide or monitor treatment for MRSA infections. Performed at [facility], [address] [place], [place] [address]   Culture, blood (Routine X 2) w Reflex to ID Panel     Status: None   Collection Time: 01/30/18  9:27 AM  Result Value Ref Range Status   Specimen Description BLOOD RIGHT ANTECUBITAL  Final   Special Requests   Final    BOTTLES DRAWN AEROBIC AND ANAEROBIC Blood Culture adequate volume   Culture   Final    NO GROWTH 5 DAYS Performed at [facility], [address] [place], [place] [address]    Report Status 02/04/2018 FINAL  Final  MRSA PCR Screening     Status: None   Collection Time: 02/05/18 11:23 PM  Result Value Ref Range Status   MRSA by PCR NEGATIVE NEGATIVE Final    Comment:        The GeneXpert MRSA Assay (FDA approved for NASAL specimens only), is one component of a comprehensive MRSA colonization surveillance program. It is not intended to diagnose MRSA infection nor to guide or monitor treatment for MRSA infections. Performed at [facility], [address] [place], [place] [address]   Aerobic/Anaerobic Culture (surgical/deep wound)     Status: None (Preliminary result)   Collection Time: 02/06/18  9:11 AM  Result Value Ref Range Status   Specimen Description TOE GREAT LEFT  Final   Special Requests   Final    NONE Performed at [facility], [address] [place], [place] [address]    Gram Stain   Final    NO WBC SEEN NO ORGANISMS SEEN Performed at [facility], [place] [address], [place], [place] [address]    Culture   Final    RARE STAPHYLOCOCCUS AUREUS NO ANAEROBES ISOLATED; CULTURE IN PROGRESS FOR 5 DAYS    Report Status PENDING  Incomplete   Organism ID, Bacteria STAPHYLOCOCCUS AUREUS  Final      Susceptibility   Staphylococcus aureus - MIC*    CIPROFLOXACIN <=0.5 SENSITIVE Sensitive     ERYTHROMYCIN <=0.25 SENSITIVE Sensitive     GENTAMICIN <=0.5 SENSITIVE Sensitive     OXACILLIN <=0.25 SENSITIVE Sensitive     TETRACYCLINE <=1 SENSITIVE Sensitive     VANCOMYCIN <=0.5 SENSITIVE Sensitive     TRIMETH/SULFA <=10 SENSITIVE Sensitive     CLINDAMYCIN <=0.25 SENSITIVE Sensitive     RIFAMPIN <=0.5 SENSITIVE Sensitive     Inducible Clindamycin NEGATIVE Sensitive     * RARE STAPHYLOCOCCUS AUREUS    Coagulation Studies: No results for input(s): LABPROT, INR in the last 72 hours.  Urinalysis: No results for input(s): COLORURINE, LABSPEC, PHURINE, GLUCOSEU, HGBUR,  BILIRUBINUR, KETONESUR, PROTEINUR, UROBILINOGEN, NITRITE, LEUKOCYTESUR in the last 72 hours.  Invalid input(s): APPERANCEUR    Imaging:  No results found.   Medications:   .  ceFAZolin (ANCEF) IV 1 g (02/09/18 2255)  . heparin 650 Units/hr (02/09/18 1941)   . acetaminophen  650 mg Oral Q8H  . aspirin EC  81 mg Oral Daily  . atorvastatin  80 mg Oral QHS  . bisacodyl  10 mg Rectal Daily  . carvedilol  3.125 mg Oral BID WC  . Chlorhexidine Gluconate Cloth  6 each Topical Q0600  . cholecalciferol  400 Units Oral Once per day on Mon Wed Fri  . docusate sodium  100 mg Oral BID  . epoetin (EPOGEN/PROCRIT) injection  4,000 Units Intravenous Q M,W,F-HD  . famotidine  20 mg Oral Daily  . feeding supplement (NEPRO CARB STEADY)  237 mL Oral BID BM  . insulin aspart  0-5 Units Subcutaneous QHS  . insulin aspart  0-9 Units Subcutaneous TID WC  . isosorbide mononitrate  30 mg Oral Daily  . multivitamin  1 tablet Oral QHS  . multivitamin with minerals  1 tablet Oral Daily  . multivitamin-lutein  1 capsule Oral Daily  . sevelamer carbonate  1,600 mg Oral TID WC   acetaminophen **OR** acetaminophen, HYDROmorphone (DILAUDID) injection, metoCLOPramide (REGLAN) injection, nitroGLYCERIN, ondansetron **OR** ondansetron (ZOFRAN) IV, oxyCODONE, traZODone  Assessment/ Plan:  [name] is a 67 y.o. black female with end stage renal disease on peritoneal dialysis, hypertension, congestive heart failure, coronary artery disease status post CABG, diabetes mellitus type II insulin dependent, GERD, peripheral vascular disease admitted to [facility] on 01/29/2018 for Peripheral vascular disease ([place]) [I73.9] Right leg pain [M79.604] Left leg pain [M79.605] Ulcer of great toe, left, with unspecified severity [facility]) [L97.529]   [facility] CCPD 10 hours 5 exchanges 2 liter fills  Transition to hemodialysis.   1. End Stage Renal Disease with hyperkalemia: transitioned  from PD to hemodialysis.  We attempted hemodialysis today however the treatment was cut short as she developed unresponsiveness shortly after starting dialysis.  After being given a small fluid bolus her blood pressure did improve and she became a bit more responsive.  We will attempt dialysis again tomorrow.  PermCath to be placed tomorrow.  2.  Staphylococcus aureus bacteremia/Sepsis/ Left great toe osteomyelitis/endocarditis - PermCath has been removed Temporary dialysis catheter placed on 10/18 - Cefazolin for 6 weeks  3. Anemia of chronic kidney disease: hemoglobin down a bit 7.9.  Continue to monitor closely.  Consider starting patient on Epogen on Thursday is still here.  4. Secondary Hyperparathyroidism:  -continue to monitor bone mineral metabolism parameters over the course of hospitalization.  5. Hypertension: had another episode of low blood pressure during dialysis treatment today.  Please see above.  6. Peripheral vascular disease: left first toe with gangrene and pain.  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

## 2018-02-10 NOTE — Consult Note (Signed)
ANTICOAGULATION CONSULT NOTE - Initial Consult  Pharmacy Consult for heparin infusion Indication: PAD  Allergies  Allergen Reactions  . Chlorthalidone Anaphylaxis, Itching and Rash  . Fentanyl Rash  . Midazolam Rash  . Ace Inhibitors Other (See Comments)    Reaction:  Hyperkalemia, agitation   . Angiotensin Receptor Blockers Other (See Comments)    Reaction:  Hyperkalemia, agitation   . Norvasc [Amlodipine] Itching and Rash  . Phenergan [Promethazine Hcl] Anxiety    "antsy, can't sit still"    Patient Measurements: Height: 5' 4"  (162.6 cm) Weight: 126 lb 8.7 oz (57.4 kg) IBW/kg (Calculated) : 54.7 Heparin Dosing Weight: 56.7  Vital Signs: Temp: 98.8 F (37.1 C) (10/21 2004) BP: 134/54 (10/21 2004) Pulse Rate: 71 (10/21 2004)  Labs: Recent Labs    02/07/18 0545 02/08/18 0439 02/09/18 0803 02/10/18 0308  HGB 8.8* 7.7* 8.1* 7.9*  HCT 29.4* 26.0* 27.1* 26.1*  PLT 238 232 249 276  HEPARINUNFRC <0.10*  --   --  0.35  CREATININE 9.29* 5.74*  --   --     Estimated Creatinine Clearance: 8.3 mL/min (A) (by C-G formula based on SCr of 5.74 mg/dL (H)).   Medical History: Past Medical History:  Diagnosis Date  . (HFpEF) heart failure with preserved ejection fraction ([place])    a. 01/2018 Echo: EF 50-55%, no rwma, Gr1 DD, triv AI, mild MR. Midly dil LA/RV, mod reduced RV fxn. Irreg thickening of TV w/ mobile echodensity that appears to arise from valve.  . Anemia   . Anorexia   . Bacteremia due to Pseudomonas   . Carotid arterial disease ([place])    a. 01/2017 Carotid U/S: RICA <09, LICA <47.  Marland Kitchen Complication of anesthesia    a. Pt reports h/o complication on 9 different occasions - ? hypotension/arrest.  . 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 01/2015 Cath [facility]): 70% LM, p-mLAD 50-60% (Resting FFR 0.75), mRCA 80-90%, ~40 Ost OM & D1-->CABG; b. 01/2016 Staged PCI of LCX x 2 and RCA.  Marland Kitchen ESRD (end stage renal disease) on dialysis ([place])    a.  ESRD secondary to acute kidney failure s/p CABG-->PD  . Essential hypertension   . GERD (gastroesophageal reflux disease)   . Heart murmur   . Hypercholesterolemia   . Myocardial infarction ([place]) 2016  . PVD (peripheral vascular disease) ([place])    a. 01/30/2018 PV Angio: Sev Left Popliteal dzs s/p PTA w/ 18m lutonix DEB w/ mech aspiration of the L popliteal, L AT, and Left tibioperoneal trunck and peroneal artery.  . 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  [facility]: Dr. BMarland KitchenHaithcock: CABG x 3, LIMA to LAD, SVG to RCA, SVG to OM3, EVH  . Sinusitis 2019  . Type II diabetes mellitus with complication (HCC)    CAD    Medications:    Assessment: 89yof now s/p peripheral vascular catheterization with arterial embolism. Pharmacy consulted to dose heparin. No initial bolus due to receiving heparin 5000 units IV x 1 in angio suite per Dr. SDelana Meyer Hemoglobin is trending down following limb salvage angioplasty 10/11 but stabilizing.Epo ordered with HD  Heparin was started at 800 units/hr 10/12 0351HL 0.47:  10/12 1307HL 0.46 10/13 0324HL 0.30: rate increased to 850 units/hr 10/13 1200HL 0.52 10/13 1957HL 0.60 10/14 0410HL 0.59 10/15 AMHL 0.85: decrease rate to 750 units/hr 10/15 1516 HL 0.81: decrease rate to 650 units/hr 10/16 0100 HL 0.54 10/16 0926 HL 1.01: question validity of value,  order placed for redraw but patient refused and then went to vascular lab  On 10/18 Infusion was stopped due to epistaxis.  Patient now to be restarted on heparin infusion.  Goal of Therapy:  Heparin level 0.3-0.7 units/ml Monitor platelets by anticoagulation protocol: Yes   Plan:  10/22 @ 0300 HL 0.35 therapeutic. Will continue current rate and will recheck next anti-Xa @ 0900. Will continue to monitor h/h.  Tobie Lords, PharmD, BCPS Clinical Pharmacist 02/10/2018

## 2018-02-10 NOTE — Progress Notes (Signed)
Nutrition Follow Up Note   DOCUMENTATION CODES:   Not applicable  INTERVENTION:   Nepro Shake po BID, each supplement provides 425 kcal and 19 grams protein  MVI daily  Rena-vite daily   Ocuvite daily for wound healing (provides zinc, vitamin A, vitamin C, Vitamin E, copper, and selenium)  Magic cup TID with meals, each supplement provides 290 kcal and 9 grams of protein  Liberalize diet  NUTRITION DIAGNOSIS:   Increased nutrient needs related to chronic illness, wound healing(ESRD on PD) as evidenced by increased estimated needs.  GOAL:   Patient will meet greater than or equal to 90% of their needs  -not met   MONITOR:   PO intake, Supplement acceptance, Labs, Weight trends, I & O's, Skin  ASSESSMENT:   67 y.o. black female with end stage renal disease on peritoneal dialysis, hypertension, congestive heart failure, coronary artery disease status post CABG, diabetes mellitus type II insulin dependent, GERD, peripheral vascular disease admitted to [facility] on 01/29/2018 for toe osteomyelitis and sepsis    Pt continues to have poor appetite and oral intake; pt eating <25% of meals and eating some food brought from home. Pt previously on a regular diet and was eating 75% of meals; RD will liberalize pt's diet as potassium and phosphorus are wnl. Pt is drinking some Nepro but refusing most of it. RD will add Magic Cups to meal trays. Pt transitioned over to HD; plan for perm cath placement tomorrow. Per chart, pt is weight stable. Recommend continue supplements, vitamins and liberal diet.    Medications reviewed and include: aspirin, dulcolax, vitamin D, colace, epoetin, pepcid, insulin, ocuvite, MVI, renvela, cefazolin, heparin  Labs reviewed: K 4.3 wnl, BUN 38(H), creat 5.74(H) P 4.3 wnl- 10/16 Wbc- 23.6(H), Hgb 7.9(L), Hct 26.1(L) iPTH- 103(H)- 09/2016 cbgs- 173, 116, 194, 213, 208 x 24 hrs  Diet Order:   Diet Order            Diet Carb Modified Fluid consistency: Thin;  Room service appropriate? Yes; Fluid restriction: 1200 mL Fluid  Diet effective now             EDUCATION NEEDS:   Education needs have been addressed  Skin:  Skin Assessment: Reviewed RN Assessment(L toe ulcer )  Last BM:  10/21- type 5  Height:   Ht Readings from Last 1 Encounters:  01/30/18 5' 4"  (1.626 m)    Weight:   Wt Readings from Last 1 Encounters:  02/10/18 57.5 kg    Ideal Body Weight:  54.5 kg  BMI:  Body mass index is 21.76 kg/m.  Estimated Nutritional Needs:   Kcal:  1400-1600kcal/day   Protein:  71-83g/day   Fluid:  UOP + 1L  Koleen Distance MS, RD, LDN Pager #- [phone] Office#- [phone] After Hours Pager: [phone]

## 2018-02-10 NOTE — Progress Notes (Signed)
PT Cancellation Note  Patient Details Name: Stephanie Ellis MRN: [id number] DOB: 1950/09/04   Cancelled Treatment:    Reason Eval/Treat Not Completed: Patient at procedure or test/unavailable   Pt out of room when session attempted this am.  Will continue as appropriate.   [name] 02/10/2018, 11:24 AM

## 2018-02-10 NOTE — Progress Notes (Signed)
HD tx start   02/10/18 1018  Vital Signs  Pulse Rate 60  Pulse Rate Source Monitor  Resp 16  BP (!) 158/57  BP Location Right Arm  BP Method Automatic  Patient Position (if appropriate) Sitting  Oxygen Therapy  SpO2 97 %  O2 Device Room Air  During Hemodialysis Assessment  Blood Flow Rate (mL/min) 200 mL/min  Arterial Pressure (mmHg) -80 mmHg  Venous Pressure (mmHg) 60 mmHg  Transmembrane Pressure (mmHg) 50 mmHg  Ultrafiltration Rate (mL/min) 570 mL/min  Dialysate Flow Rate (mL/min) 800 ml/min  Conductivity: Machine  14  HD Safety Checks Performed Yes  Dialysis Fluid Bolus Normal Saline  Bolus Amount (mL) 250 mL  Intra-Hemodialysis Comments Tx initiated  Hemodialysis Catheter Right Femoral vein Triple-lumen;Temporary  Placement Date/Time: 02/06/18 (c)   Placed prior to admission: No  Orientation: Right  Access Location: Femoral vein  Hemodialysis Catheter Type: Triple-lumen;Temporary  Blue Lumen Status Infusing  Red Lumen Status Infusing

## 2018-02-10 NOTE — Progress Notes (Signed)
Pt's temporary catheter was unable to run r/t increased arterial pressures despite lines being reversed and BFR decreased. Pt was unable to run even at a BFR of 150, MD aware.    02/10/18 1040  Vital Signs  Pulse Rate 78  Resp 15  BP (!) 138/55  Oxygen Therapy  SpO2 100 %

## 2018-02-10 NOTE — Progress Notes (Signed)
Pre HD assessment    02/10/18 1012  Vital Signs  Temp 98.5 F (36.9 C)  Temp Source Oral  Pulse Rate 62  Pulse Rate Source Monitor  Resp 16  BP (!) 158/58  BP Location Right Arm  BP Method Automatic  Patient Position (if appropriate) Lying  Oxygen Therapy  SpO2 91 %  O2 Device Room Air  Dialysis Weight  Weight 57.5 kg  Type of Weight Pre-Dialysis  Time-Out for Hemodialysis  What Procedure? HD  Pt Identifiers(min of two) First/Last Name;MRN/Account#  Correct Site? Yes  Correct Side? Yes  Correct Procedure? Yes  Consents Verified? Yes  Rad Studies Available? N/A  Safety Precautions Reviewed? Yes  Engineer, civil (consulting) Number  (4A)  Station Number 1  UF/Alarm Test Passed  Conductivity: Meter 13.8  Conductivity: Machine  14.1  pH 7.6  Reverse Osmosis main  Normal Saline Lot Number [id number]  Dialyzer Lot Number [id number]  Disposable Set Lot Number [id number]  Machine Temperature 98.6 F (37 C)  Musician and Audible Yes  Blood Lines Intact and Secured Yes  Pre Treatment Patient Checks  Vascular access used during treatment Catheter  Patient is receiving dialysis in a chair Yes  Hepatitis B Surface Antigen Results Negative  Date Hepatitis B Surface Antigen Drawn 11/26/17  Hepatitis B Surface Antibody  (>10)  Date Hepatitis B Surface Antibody Drawn 11/26/17  Hemodialysis Consent Verified Yes  Hemodialysis Standing Orders Initiated Yes  ECG (Telemetry) Monitor On Yes  Prime Ordered Normal Saline  Length of  DialysisTreatment -hour(s) 3.5 Hour(s)  Dialyzer Elisio 17H NR  Dialysate 2K, 2.5 Ca  Dialysis Anticoagulant None  Dialysate Flow Ordered 800  Blood Flow Rate Ordered 400 mL/min  Ultrafiltration Goal 1.5 Liters  Pre Treatment Labs Phosphorus (heparin level )  Dialysis Blood Pressure Support Ordered Normal Saline  Education / Care Plan  Dialysis Education Provided Yes  Documented Education in Care Plan Yes  Hemodialysis Catheter Right Femoral vein  Triple-lumen;Temporary  Placement Date/Time: 02/06/18 (c)   Placed prior to admission: No  Orientation: Right  Access Location: Femoral vein  Hemodialysis Catheter Type: Triple-lumen;Temporary  Site Condition No complications  Blue Lumen Status Heparin locked  Red Lumen Status Heparin locked  Dressing Type Biopatch  Dressing Status Clean;Dry;Intact  Drainage Description None

## 2018-02-10 NOTE — Progress Notes (Signed)
Pre HD assessment    02/10/18 1013  Neurological  Level of Consciousness Alert  Orientation Level Oriented X4  Respiratory  Respiratory Pattern Regular;Unlabored  Chest Assessment Chest expansion symmetrical  Cardiac  ECG Monitor Yes  Cardiac Rhythm NSR  Vascular  R Radial Pulse +2  L Radial Pulse +1  Edema Left lower extremity  Integumentary  Integumentary (WDL) X  Skin Color Appropriate for ethnicity  Musculoskeletal  Musculoskeletal (WDL) X  Generalized Weakness Yes  Assistive Device None  GU Assessment  Genitourinary (WDL) X  Genitourinary Symptoms  (HD)  Psychosocial  Psychosocial (WDL) WDL

## 2018-02-10 NOTE — Plan of Care (Signed)
NPO after midnight for permanent catheter placement. Per Dr. Bridgett Larsson the patient will have dialysis tomorrow as well and than D/C to SNF.  Problem: Education: Goal: Knowledge of General Education information will improve Description Including pain rating scale, medication(s)/side effects and non-pharmacologic comfort measures Outcome: Progressing   Problem: Health Behavior/Discharge Planning: Goal: Ability to manage health-related needs will improve Outcome: Progressing   Problem: Clinical Measurements: Goal: Ability to maintain clinical measurements within normal limits will improve Outcome: Progressing Goal: Will remain free from infection Outcome: Progressing Goal: Diagnostic test results will improve Outcome: Progressing Goal: Respiratory complications will improve Outcome: Progressing Goal: Cardiovascular complication will be avoided Outcome: Progressing   Problem: Activity: Goal: Risk for activity intolerance will decrease Outcome: Progressing   Problem: Nutrition: Goal: Adequate nutrition will be maintained Outcome: Progressing   Problem: Coping: Goal: Level of anxiety will decrease Outcome: Progressing   Problem: Elimination: Goal: Will not experience complications related to bowel motility Outcome: Progressing Goal: Will not experience complications related to urinary retention Outcome: Progressing   Problem: Pain Managment: Goal: General experience of comfort will improve Outcome: Progressing   Problem: Safety: Goal: Ability to remain free from injury will improve Outcome: Progressing   Problem: Skin Integrity: Goal: Risk for impaired skin integrity will decrease Outcome: Progressing

## 2018-02-10 NOTE — Progress Notes (Signed)
Inpatient Diabetes Program Recommendations  AACE/ADA: New Consensus Statement on Inpatient Glycemic Control (2015)  Target Ranges:  Prepandial:   less than 140 mg/dL      Peak postprandial:   less than 180 mg/dL (1-2 hours)      Critically ill patients:  140 - 180 mg/dL   Results for Stephanie Ellis, Stephanie Ellis (MRN [id number]) as of 02/10/2018 10:14  Ref. Range 02/09/2018 07:38 02/09/2018 11:31 02/09/2018 16:55 02/09/2018 21:22  Glucose-Capillary Latest Ref Range: 70 - 99 mg/dL 173 (H)  2 units NOVOLOG  116 (H) 194 (H)  2 units NOVOLOG  213 (H)  2 units NOVOLOG    Results for Stephanie Ellis, Stephanie Ellis (MRN [id number]) as of 02/10/2018 10:14  Ref. Range 02/10/2018 07:50  Glucose-Capillary Latest Ref Range: 70 - 99 mg/dL 208 (H)  3 units NOVOLOG     Home DM Meds: Lantus 14 units QHS  Current Orders: Novolog Sensitive Correction Scale/ SSI (0-9 units) TID AC + HS       MD- Note patient takes Lantus at home.  CBG mildly elevated this AM.  Please consider starting Lantus 7 units QHS tonight (50% total home dose)    --Will follow patient during hospitalization--  Wyn Quaker RN, MSN, CDE Diabetes Coordinator Inpatient Glycemic Control Team Team Pager: [phone] (8a-5p)

## 2018-02-10 NOTE — Progress Notes (Signed)
HD tx end. Pt did not tolerate tx well. 15 minutes after tx initiation pt became unresponsive r/t a drop in BP from 158/58 to 84/48. Pt was given 151m NS bolus X2 and began to improve. Pt was still not back to baseline, diaphoretic, clammy,  and unable to answer questions so a rapid response was called. MD was notified and came to the bedside. Pt was rinsed back and tx was stopped.  Net UF -613, goal not met.    02/10/18 1038  Vital Signs  Temp 98.2 F (36.8 C)  Temp Source Oral  Pulse Rate 75  Pulse Rate Source Monitor  Resp (!) 58  BP (!) 138/55  BP Location Right Arm  BP Method Automatic  Patient Position (if appropriate) Sitting  Oxygen Therapy  SpO2 95 %  O2 Device Nasal Cannula  O2 Flow Rate (L/min) 2 L/min  Dialysis Weight  Weight  (pt in chair unable to stand)  Type of Weight Post-Dialysis  During Hemodialysis Assessment  Dialysis Fluid Bolus Normal Saline  Bolus Amount (mL) 250 mL  Intra-Hemodialysis Comments Tx completed  Post-Hemodialysis Assessment  Rinseback Volume (mL) 250 mL  KECN 3 V  Dialyzer Clearance Lightly streaked  Duration of HD Treatment -hour(s) 0.25 hour(s)  Hemodialysis Intake (mL) 700 mL  UF Total -Machine (mL) 87 mL  Net UF (mL) -613 mL  Tolerated HD Treatment No (Comment) (see progress note )  Hemodialysis Catheter Right Femoral vein Triple-lumen;Temporary  Placement Date/Time: 02/06/18 (c)   Placed prior to admission: No  Orientation: Right  Access Location: Femoral vein  Hemodialysis Catheter Type: Triple-lumen;Temporary  Site Condition No complications  Blue Lumen Status Heparin locked  Red Lumen Status Heparin locked  Purple Lumen Status N/A  Catheter fill solution Heparin 1000 units/ml  Catheter fill volume (Arterial) 1.8 cc  Catheter fill volume (Venous) 1.8  Dressing Type Biopatch  Dressing Status Clean;Dry;Intact  Drainage Description None  Post treatment catheter status Capped and Clamped

## 2018-02-10 NOTE — Progress Notes (Signed)
The patient was able to tolerate sitting up in the chair for 3 hrs on 02/05/18.

## 2018-02-10 NOTE — Progress Notes (Signed)
   02/10/18 1000  Clinical Encounter Type  Visited With Patient  Visit Type Initial;Code (Rapid Response)  Referral From Nurse  Recommendations Follow-up as requested.  Spiritual Encounters  Spiritual Needs Emotional;Prayer   Chaplain provided pastoral presence and energetic prayer during the Rapid Response.

## 2018-02-10 NOTE — Consult Note (Signed)
ANTICOAGULATION CONSULT NOTE - Follow Up Consult  Pharmacy Consult for Heparin infusion Indication: PAD  Allergies  Allergen Reactions  . Chlorthalidone Anaphylaxis, Itching and Rash  . Fentanyl Rash  . Midazolam Rash  . Ace Inhibitors Other (See Comments)    Reaction:  Hyperkalemia, agitation   . Angiotensin Receptor Blockers Other (See Comments)    Reaction:  Hyperkalemia, agitation   . Norvasc [Amlodipine] Itching and Rash  . Phenergan [Promethazine Hcl] Anxiety    "antsy, can't sit still"    Patient Measurements: Height: 5' 4"  (162.6 cm) Weight: (pt in chair unable to stand) IBW/kg (Calculated) : 54.7 Heparin Dosing Weight: 56.7  Vital Signs: Temp: 98.1 F (36.7 C) (10/22 1243) Temp Source: Oral (10/22 1243) BP: 155/55 (10/22 1243) Pulse Rate: 63 (10/22 1243)  Labs: Recent Labs    02/08/18 0439 02/09/18 0803 02/10/18 0308 02/10/18 1229  HGB 7.7* 8.1* 7.9*  --   HCT 26.0* 27.1* 26.1*  --   PLT 232 249 276  --   HEPARINUNFRC  --   --  0.35 0.41  CREATININE 5.74*  --   --   --     Estimated Creatinine Clearance: 8.3 mL/min (A) (by C-G formula based on SCr of 5.74 mg/dL (H)).   Assessment: 70 yof now s/p peripheral vascular catheterization with arterial embolism. Pharmacy consulted to dose heparin. No initial bolus due to receiving heparin 5000 units IV x 1 in angio suite per Dr. Delana Meyer. Hemoglobin is trending down following limb salvage angioplasty 10/11 but stabilizing.Epo ordered with HD  Heparin was started at 800 units/hr 10/12 0351HL 0.47:  10/12 1307HL 0.46 10/13 0324HL 0.30: rate increased to 850 units/hr 10/13 1200HL 0.52 10/13 1957HL 0.60 10/14 0410HL 0.59 10/15 AMHL 0.85: decrease rate to 750 units/hr 10/15 1516 HL 0.81: decrease rate to 650 units/hr 10/16 0100 HL 0.54 10/16 0926 HL 1.01: question validity of value, order placed for redraw but patient refused and then went to vascular lab On 10/18 Infusion was stopped due to  epistaxis.  Patient now to be restarted on heparin infusion. 10/22 @ 0300 HL 0.35 therapeutic 10/22 @ 1229 HL 0.41 therapeutic  Goal of Therapy:  Heparin level 0.3-0.7 units/ml Monitor platelets by anticoagulation protocol: Yes   Plan:  Will continue current rate and will recheck next anti-Xa @ 0500 10/23. Will continue to monitor heparin level and CBC daily.  Lu Duffel, PharmD Clinical Pharmacist 02/10/2018 1:46 PM

## 2018-02-10 NOTE — Progress Notes (Signed)
The patient is table after returning from hemodialysis. The patient was not able to complete treatment as dialysis as her  blood pressure dropped, the patient was diaphoretic and could not look at dialysis nurse in the eyes. The patient was brought back from dialysis to her room 215. BP and blood glucose checked at arrival and are stable at this time.

## 2018-02-11 ENCOUNTER — Encounter: Payer: Self-pay | Admitting: Vascular Surgery

## 2018-02-11 ENCOUNTER — Encounter
Admission: EM | Disposition: A | Discharge status: Skilled Nursing Facility | Payer: Self-pay | Source: Home / Self Care | Attending: Internal Medicine

## 2018-02-11 ENCOUNTER — Inpatient Hospital Stay: Payer: Medicare Other

## 2018-02-11 DIAGNOSIS — Z992 Dependence on renal dialysis: Secondary | ICD-10-CM

## 2018-02-11 DIAGNOSIS — N186 End stage renal disease: Secondary | ICD-10-CM

## 2018-02-11 HISTORY — PX: DIALYSIS/PERMA CATHETER INSERTION: CATH118288

## 2018-02-11 LAB — BASIC METABOLIC PANEL
Anion gap: 17 — ABNORMAL HIGH (ref 5–15)
BUN: 75 mg/dL — ABNORMAL HIGH (ref 8–23)
CO2: 25 mmol/L (ref 22–32)
Calcium: 8 mg/dL — ABNORMAL LOW (ref 8.9–10.3)
Chloride: 92 mmol/L — ABNORMAL LOW (ref 98–111)
Creatinine, Ser: 9.55 mg/dL — ABNORMAL HIGH (ref 0.44–1.00)
GFR calc Af Amer: 4 mL/min — ABNORMAL LOW (ref >60)
GFR calc non Af Amer: 4 mL/min — ABNORMAL LOW (ref >60)
Glucose, Bld: 208 mg/dL — ABNORMAL HIGH (ref 70–99)
Potassium: 5.3 mmol/L — ABNORMAL HIGH (ref 3.5–5.1)
Sodium: 134 mmol/L — ABNORMAL LOW (ref 135–145)

## 2018-02-11 LAB — GLUCOSE, CAPILLARY
Glucose-Capillary: 179 mg/dL — ABNORMAL HIGH (ref 70–99)
Glucose-Capillary: 133 mg/dL — ABNORMAL HIGH (ref 70–99)
Glucose-Capillary: 118 mg/dL — ABNORMAL HIGH (ref 70–99)
Glucose-Capillary: 90 mg/dL (ref 70–99)
Glucose-Capillary: 225 mg/dL — ABNORMAL HIGH (ref 70–99)

## 2018-02-11 LAB — CBC
HCT: 26 % — ABNORMAL LOW (ref 36.0–46.0)
HCT: 24.7 % — ABNORMAL LOW (ref 36.0–46.0)
Hemoglobin: 7.5 g/dL — ABNORMAL LOW (ref 12.0–15.0)
Hemoglobin: 7.2 g/dL — ABNORMAL LOW (ref 12.0–15.0)
MCH: 29.8 pg (ref 26.0–34.0)
MCH: 30.1 pg (ref 26.0–34.0)
MCHC: 28.8 g/dL — ABNORMAL LOW (ref 30.0–36.0)
MCHC: 29.1 g/dL — ABNORMAL LOW (ref 30.0–36.0)
MCV: 103.2 fL — ABNORMAL HIGH (ref 80.0–100.0)
MCV: 103.3 fL — ABNORMAL HIGH (ref 80.0–100.0)
Platelets: 284 10*3/uL (ref 150–400)
Platelets: 295 10*3/uL (ref 150–400)
RBC: 2.52 MIL/uL — ABNORMAL LOW (ref 3.87–5.11)
RBC: 2.39 MIL/uL — ABNORMAL LOW (ref 3.87–5.11)
RDW: 21.9 % — ABNORMAL HIGH (ref 11.5–15.5)
RDW: 22.2 % — ABNORMAL HIGH (ref 11.5–15.5)
WBC: 20.3 10*3/uL — ABNORMAL HIGH (ref 4.0–10.5)
WBC: 20.6 10*3/uL — ABNORMAL HIGH (ref 4.0–10.5)
nRBC: 0 % (ref 0.0–0.2)
nRBC: 0 % (ref 0.0–0.2)

## 2018-02-11 LAB — HEPARIN LEVEL (UNFRACTIONATED): Heparin Unfractionated: 0.16 IU/mL — ABNORMAL LOW (ref 0.30–0.70)

## 2018-02-11 SURGERY — DIALYSIS/PERMA CATHETER INSERTION
Anesthesia: Moderate Sedation

## 2018-02-11 MED ORDER — LORAZEPAM 2 MG/ML IJ SOLN
INTRAMUSCULAR | Status: DC | PRN
  Administered 2018-02-11: 0.5 mg via INTRAVENOUS

## 2018-02-11 MED ORDER — HEPARIN (PORCINE) IN NACL 1000-0.9 UT/500ML-% IV SOLN
INTRAVENOUS | Status: AC
  Filled 2018-02-11: qty 500

## 2018-02-11 MED ORDER — HYDROMORPHONE HCL 1 MG/ML IJ SOLN: 0.5000 mg | Freq: Once | INTRAMUSCULAR | Status: DC

## 2018-02-11 MED ORDER — HEPARIN SODIUM (PORCINE) 10000 UNIT/ML IJ SOLN
INTRAMUSCULAR | Status: AC
  Filled 2018-02-11: qty 1

## 2018-02-11 MED ORDER — LORAZEPAM 2 MG/ML IJ SOLN
0.5000 mg | Freq: Once | INTRAMUSCULAR | Status: DC
  Filled 2018-02-11: qty 1

## 2018-02-11 MED ORDER — HYDROMORPHONE HCL 1 MG/ML IJ SOLN
0.5000 mg | Freq: Once | INTRAMUSCULAR | Status: AC
  Administered 2018-02-11: 0.5 mg via INTRAVENOUS

## 2018-02-11 MED ORDER — MIDODRINE HCL 5 MG PO TABS
5.0000 mg | ORAL_TABLET | ORAL | Status: DC
  Administered 2018-02-12: 5 mg via ORAL
  Filled 2018-02-11: qty 1

## 2018-02-11 MED ORDER — LIDOCAINE-EPINEPHRINE (PF) 1 %-1:200000 IJ SOLN
INTRAMUSCULAR | Status: AC
  Filled 2018-02-11: qty 30

## 2018-02-11 MED ORDER — HYDROMORPHONE HCL 1 MG/ML IJ SOLN
INTRAMUSCULAR | Status: AC
  Administered 2018-02-11: 0.5 mg via INTRAVENOUS
  Filled 2018-02-11: qty 0.5

## 2018-02-11 MED ORDER — CEFAZOLIN SODIUM-DEXTROSE 1-4 GM/50ML-% IV SOLN
INTRAVENOUS | Status: AC
  Filled 2018-02-11: qty 50

## 2018-02-11 SURGICAL SUPPLY — 4 items
CANNULA 5F STIFF (CANNULA) ×2 IMPLANT
CATH PALINDROME RT-P 15FX23CM (CATHETERS) ×4 IMPLANT
PACK ANGIOGRAPHY (CUSTOM PROCEDURE TRAY) ×2 IMPLANT
SUT MNCRL AB 4-0 PS2 18 (SUTURE) ×2 IMPLANT

## 2018-02-11 NOTE — Progress Notes (Signed)
Nurse went to room to notify patient of going to dialysis. Upon interaction with patient nurse smelled blood, nurse asked patient if she could check her vaginal and rectum area to see if she was bleeding the patient stated " yes, I was bleeding down there when I had my procedure". Nurse asked patient " was that today you are speaking of, not the blood clot you coughed up, but you were bleeding from down there today"? Patient stated " yes". Upon vaginal area assessment nurse noted dried blood on brief and inside brief, nurse then cleansed the perineal area with soap and water and noted a  Maroon color on wash cloths and wipes when patient wiped. There did not seem to be any active bleeding from the vaginal area and the patient verified that she no longer gets a menstrual cycle. Dr. Bridgett Larsson notified of the above and stated " I consulted pulmonology and I will order chest xray". Patient has been transported to dialysis and dialysis nurse is aware of incident of blood being found and previous incident of blood clot being coughed up.

## 2018-02-11 NOTE — Progress Notes (Signed)
Post HD Assessment    02/11/18 1645  Neurological  Level of Consciousness Alert  Orientation Level Oriented X4  Respiratory  Respiratory Pattern Regular;Unlabored;Symmetrical  Chest Assessment Chest expansion symmetrical  Bilateral Breath Sounds Clear  Cardiac  Pulse Regular  Heart Sounds S1, S2  Jugular Venous Distention (JVD) No  ECG Monitor Yes  Cardiac Rhythm NSR  Ectopy Unifocal PVC's  Ectopy Frequency Frequent  Antiarrhythmic device No  Vascular  R Radial Pulse +1  L Radial Pulse +1  Edema Left lower extremity  LLE Edema +1  Integumentary  Integumentary (WDL) X  Skin Color Appropriate for ethnicity  Skin Condition Dry  Skin Integrity Surgical Incision (see LDA)  Musculoskeletal  Musculoskeletal (WDL) X  Generalized Weakness Yes  GU Assessment  Genitourinary (WDL) X (HD pt)  Psychosocial  Psychosocial (WDL) WDL

## 2018-02-11 NOTE — Progress Notes (Signed)
Right femoral dialysis temp. Cath removed, patient tolerated well. Pressure held to area for 20 minutes with no bleeding noted after dressing applied. Catheter tip intact.

## 2018-02-11 NOTE — Progress Notes (Signed)
PT Cancellation Note  Patient Details Name: Stephanie Ellis MRN: [id number] DOB: Aug 20, 1950   Cancelled Treatment:    Reason Eval/Treat Not Completed: Medical issues which prohibited therapy. Hold PT today, as pt K is 5.3 and per MD note due to multiple medical issues pt is at high risk for cardiac arrest. Re attempt when pt lab values demonstrate greater medical stability.    Larae Grooms, PTA 02/11/2018, 9:52 AM

## 2018-02-11 NOTE — Progress Notes (Addendum)
[facility]  ROUNDING NOTE   Subjective:   Patient was seen on hemodialysis. She denies any lightheadedness, shortness of breath or edema. Admits to fatigue.  Had permcath placed prior to dialysis.  Had an unresponsive episode during treatment yesterday.    Objective:  Vital signs in last 24 hours:  Temp:  [97.9 F (36.6 C)-98.6 F (37 C)] 98 F (36.7 C) (10/23 1315) Pulse Rate:  [60-82] 65 (10/23 1500) Resp:  [0-22] 15 (10/23 1515) BP: (77-168)/(39-77) 105/41 (10/23 1515) SpO2:  [95 %-99 %] 98 % (10/23 1515) FiO2 (%):  [96 %-98 %] 98 % (10/23 1131)  Weight change: 0 kg Filed Weights   02/10/18 0441 02/10/18 1012  Weight: 57.5 kg 57.5 kg    Intake/Output: I/O last 3 completed shifts: In: 581.9 [P.O.:360; I.V.:121.9; IV Piggyback:100] Out: -613    Intake/Output this shift:  No intake/output data recorded.  Physical Exam: General: NAD,   Head: Normocephalic, atraumatic. Moist oral mucosal membranes  Eyes: Anicteric, PERRL  Neck: Supple, trachea midline  Lungs:  Clear to auscultation  Heart: Regular rate and rhythm  Abdomen:  Soft, nontender,   Extremities:  no peripheral edema.  Neurologic: Nonfocal, moving all four extremities  Skin: No lesions  Access: PD catheter, left IJ tunneled catheter, right femoral temp catheter .     Basic Metabolic Panel: Recent Labs  Lab 02/06/18 0511 02/07/18 0545 02/08/18 0439 02/11/18 0350  NA  --  135 139 134*  K 5.3* 5.5* 4.3 5.3*  CL  --  90* 96* 92*  CO2  --  26 32 25  GLUCOSE  --  187* 244* 208*  BUN  --  62* 38* 75*  CREATININE  --  9.29* 5.74* 9.55*  CALCIUM  --  8.6* 7.9* 8.0*    Liver Function Tests: No results for input(s): AST, ALT, ALKPHOS, BILITOT, PROT, ALBUMIN in the last 168 hours. No results for input(s): LIPASE, AMYLASE in the last 168 hours. No results for input(s): AMMONIA in the last 168 hours.  CBC: Recent Labs  Lab 02/08/18 0439 02/09/18 0803 02/10/18 0308 02/11/18 0350  02/11/18 1321  WBC 24.8* 23.8* 23.6* 20.3* 20.6*  HGB 7.7* 8.1* 7.9* 7.5* 7.2*  HCT 26.0* 27.1* 26.1* 26.0* 24.7*  MCV 100.4* 101.5* 101.2* 103.2* 103.3*  PLT 232 249 276 284 295    Cardiac Enzymes: No results for input(s): CKTOTAL, CKMB, CKMBINDEX, TROPONINI in the last 168 hours.  BNP: Invalid input(s): POCBNP  CBG: Recent Labs  Lab 02/10/18 1121 02/10/18 1654 02/10/18 2100 02/11/18 0750 02/11/18 1220  GLUCAP 137* 148* 189* 179* 118*    Microbiology: Results for orders placed or performed during the hospital encounter of 01/29/18  Blood Culture (routine x 2)     Status: Abnormal   Collection Time: 01/29/18  9:55 PM  Result Value Ref Range Status   Specimen Description   Final    BLOOD RIGHT ANTECUBITAL Performed at [facility], [address] [place], [place] [address]    Special Requests   Final    BOTTLES DRAWN AEROBIC AND ANAEROBIC Blood Culture adequate volume Performed at [facility], [address] [place], [place] [address]    Culture  Setup Time   Final    GRAM POSITIVE COCCI IN CLUSTERS IN BOTH AEROBIC AND ANAEROBIC BOTTLES CRITICAL RESULT CALLED TO, READ BACK BY AND VERIFIED WITH: KAREN HAYES 01/30/18 0839 JGF Performed at [facility], [address], [place], [place] [address]    Culture (  A)  Final    STAPHYLOCOCCUS AUREUS SUSCEPTIBILITIES PERFORMED ON PREVIOUS CULTURE WITHIN THE LAST 5 DAYS. Performed at [facility], [place] [address], [place], [place] [address]    Report Status 02/01/2018 FINAL  Final  Blood Culture (routine x 2)     Status: Abnormal   Collection Time: 01/29/18  9:55 PM  Result Value Ref Range Status   Specimen Description   Final    BLOOD RIGHT ANTECUBITAL Performed at [facility], [address], [place], [place] [address]    Special Requests   Final    BOTTLES DRAWN AEROBIC AND ANAEROBIC Blood Culture adequate volume Performed at [facility], [address] [place], [place] [address]    Culture  Setup Time   Final    GRAM POSITIVE COCCI IN CLUSTERS IN BOTH AEROBIC AND ANAEROBIC BOTTLES CRITICAL RESULT CALLED TO, READ BACK BY AND VERIFIED WITH: KAREN HAYES 01/30/18 0839 JGF Performed at [facility], [place] [address], [place], [place] [address]    Culture STAPHYLOCOCCUS AUREUS (A)  Final   Report Status 02/01/2018 FINAL  Final   Organism ID, Bacteria STAPHYLOCOCCUS AUREUS  Final      Susceptibility   Staphylococcus aureus - MIC*    CIPROFLOXACIN <=0.5 SENSITIVE Sensitive     ERYTHROMYCIN <=0.25 SENSITIVE Sensitive     GENTAMICIN <=0.5 SENSITIVE Sensitive     OXACILLIN 0.5 SENSITIVE Sensitive     TETRACYCLINE <=1 SENSITIVE Sensitive     VANCOMYCIN <=0.5 SENSITIVE Sensitive     TRIMETH/SULFA <=10 SENSITIVE Sensitive     CLINDAMYCIN <=0.25 SENSITIVE Sensitive     RIFAMPIN <=0.5 SENSITIVE Sensitive     Inducible Clindamycin NEGATIVE Sensitive     * STAPHYLOCOCCUS AUREUS  Blood Culture ID Panel (Reflexed)     Status: Abnormal   Collection Time: 01/29/18  9:55 PM  Result Value Ref Range Status   Enterococcus species NOT DETECTED NOT DETECTED Final   Vancomycin resistance NOT DETECTED NOT DETECTED Final   Listeria monocytogenes NOT DETECTED NOT DETECTED Final   Staphylococcus species DETECTED (A) NOT DETECTED Final    Comment: CRITICAL RESULT CALLED TO, READ BACK BY AND VERIFIED WITH: KAREN HAYES 01/30/18 0839 JGF    Staphylococcus aureus (BCID) DETECTED (A) NOT DETECTED Final    Comment: CRITICAL RESULT CALLED TO, READ BACK BY AND VERIFIED WITH: KAREN HAYES 01/30/18 0839 JGF Methicillin (oxacillin) susceptible Staphylococcus aureus (MSSA). Preferred therapy is anti staphylococcal beta lactam antibiotic (Cefazolin or Nafcillin), unless clinically contraindicated.    Methicillin resistance NOT DETECTED NOT DETECTED Final   Streptococcus species NOT DETECTED NOT DETECTED Final   Streptococcus agalactiae NOT DETECTED NOT DETECTED Final    Streptococcus pneumoniae NOT DETECTED NOT DETECTED Final   Streptococcus pyogenes NOT DETECTED NOT DETECTED Final   Acinetobacter baumannii NOT DETECTED NOT DETECTED Final   Enterobacteriaceae species NOT DETECTED NOT DETECTED Final   Enterobacter cloacae complex NOT DETECTED NOT DETECTED Final   Escherichia coli NOT DETECTED NOT DETECTED Final   Klebsiella oxytoca NOT DETECTED NOT DETECTED Final   Klebsiella pneumoniae NOT DETECTED NOT DETECTED Final   Proteus species NOT DETECTED NOT DETECTED Final   Serratia marcescens NOT DETECTED NOT DETECTED Final   Carbapenem resistance NOT DETECTED NOT DETECTED Final   Haemophilus influenzae NOT DETECTED NOT DETECTED Final   Neisseria meningitidis NOT DETECTED NOT DETECTED Final   Pseudomonas aeruginosa NOT DETECTED NOT DETECTED Final   Candida albicans NOT DETECTED NOT DETECTED Final   Candida glabrata  NOT DETECTED NOT DETECTED Final   Candida krusei NOT DETECTED NOT DETECTED Final   Candida parapsilosis NOT DETECTED NOT DETECTED Final   Candida tropicalis NOT DETECTED NOT DETECTED Final    Comment: Performed at [facility], [address] [place], [place] [address]  MRSA PCR Screening     Status: None   Collection Time: 01/29/18 10:55 PM  Result Value Ref Range Status   MRSA by PCR NEGATIVE NEGATIVE Final    Comment:        The GeneXpert MRSA Assay (FDA approved for NASAL specimens only), is one component of a comprehensive MRSA colonization surveillance program. It is not intended to diagnose MRSA infection nor to guide or monitor treatment for MRSA infections. Performed at [facility], [address] [place], [place] [address]   Culture, blood (Routine X 2) w Reflex to ID Panel     Status: None   Collection Time: 01/30/18  9:27 AM  Result Value Ref Range Status   Specimen Description BLOOD RIGHT ANTECUBITAL  Final   Special Requests   Final    BOTTLES DRAWN AEROBIC AND ANAEROBIC Blood Culture adequate  volume   Culture   Final    NO GROWTH 5 DAYS Performed at [facility], [address] [place], [place] [address]    Report Status 02/04/2018 FINAL  Final  MRSA PCR Screening     Status: None   Collection Time: 02/05/18 11:23 PM  Result Value Ref Range Status   MRSA by PCR NEGATIVE NEGATIVE Final    Comment:        The GeneXpert MRSA Assay (FDA approved for NASAL specimens only), is one component of a comprehensive MRSA colonization surveillance program. It is not intended to diagnose MRSA infection nor to guide or monitor treatment for MRSA infections. Performed at [facility], [address] [place], [place] [address]   Aerobic/Anaerobic Culture (surgical/deep wound)     Status: None (Preliminary result)   Collection Time: 02/06/18  9:11 AM  Result Value Ref Range Status   Specimen Description TOE GREAT LEFT  Final   Special Requests   Final    NONE Performed at [facility], [address] [place], [place] [address]    Gram Stain   Final    NO WBC SEEN NO ORGANISMS SEEN Performed at [facility], [place] [address] [place], [place] [address]    Culture   Final    RARE STAPHYLOCOCCUS AUREUS NO ANAEROBES ISOLATED; CULTURE IN PROGRESS FOR 5 DAYS    Report Status PENDING  Incomplete   Organism ID, Bacteria STAPHYLOCOCCUS AUREUS  Final      Susceptibility   Staphylococcus aureus - MIC*    CIPROFLOXACIN <=0.5 SENSITIVE Sensitive     ERYTHROMYCIN <=0.25 SENSITIVE Sensitive     GENTAMICIN <=0.5 SENSITIVE Sensitive     OXACILLIN <=0.25 SENSITIVE Sensitive     TETRACYCLINE <=1 SENSITIVE Sensitive     VANCOMYCIN <=0.5 SENSITIVE Sensitive     TRIMETH/SULFA <=10 SENSITIVE Sensitive     CLINDAMYCIN <=0.25 SENSITIVE Sensitive     RIFAMPIN <=0.5 SENSITIVE Sensitive     Inducible Clindamycin NEGATIVE Sensitive     * RARE STAPHYLOCOCCUS AUREUS    Coagulation Studies: No results for input(s): LABPROT, INR in the last 72  hours.  Urinalysis: No results for input(s): COLORURINE, LABSPEC, PHURINE, GLUCOSEU, HGBUR, BILIRUBINUR, KETONESUR, PROTEINUR, UROBILINOGEN, NITRITE, LEUKOCYTESUR in the last 72 hours.  Invalid input(s): APPERANCEUR    Imaging:  No results found.   Medications:   . ceFAZolin    .  ceFAZolin (ANCEF) IV 1 g (02/10/18 2330)  .  ceFAZolin (ANCEF) IV     . acetaminophen  650 mg Oral Q8H  . aspirin EC  81 mg Oral Daily  . atorvastatin  80 mg Oral QHS  . bisacodyl  10 mg Rectal Daily  . calcitRIOL  0.5 mcg Oral Q breakfast  . carvedilol  3.125 mg Oral BID WC  . Chlorhexidine Gluconate Cloth  6 each Topical Q0600  . cholecalciferol  400 Units Oral Once per day on Mon Wed Fri  . docusate sodium  100 mg Oral BID  . epoetin (EPOGEN/PROCRIT) injection  4,000 Units Intravenous Q M,W,F-HD  . famotidine  20 mg Oral Daily  . feeding supplement (NEPRO CARB STEADY)  237 mL Oral BID BM  .  HYDROmorphone (DILAUDID) injection  0.5 mg Intravenous Once  . insulin aspart  0-5 Units Subcutaneous QHS  . insulin aspart  0-9 Units Subcutaneous TID WC  . isosorbide mononitrate  30 mg Oral Daily  . LORazepam  0.5 mg Intravenous Once  . mirtazapine  7.5 mg Oral QHS  . multivitamin  1 tablet Oral QHS  . multivitamin with minerals  1 tablet Oral Daily  . multivitamin-lutein  1 capsule Oral Daily  . sevelamer carbonate  1,600 mg Oral TID WC   acetaminophen **OR** acetaminophen, HYDROmorphone (DILAUDID) injection, metoCLOPramide (REGLAN) injection, nitroGLYCERIN, ondansetron **OR** ondansetron (ZOFRAN) IV, oxyCODONE, traZODone  Assessment/ Plan:  Stephanie Ellis is a 67 y.o. african [place] female with end stage renal disease on peritoneal dialysis, hypertension, congestive heart failure, coronary artery disease status post CABG, diabetes mellitus type II insulin dependent, GERD, peripheral vascular disease admitted to [facility] on 01/29/2018 for leg pain.    [facility] [place] Peritoneal  Dialysis CCPD 10 hours 5 exchanges 2 liter fills  Transition to hemodialysis.   1. End Stage Renal Disease with hyperkalemia: transitioned from PD to hemodialysis.  - Dialysis yesterday was complicated by an episode of unresponsiveness.  - currently having hypotensive episodes on treatment, asymptomatic and UF paused.  - Left tunneled permcath placed this morning by Dr. Lucky Cowboy  2.  Staphylococcus aureus bacteremia/Sepsis/ Left great toe osteomyelitis/endocarditis Temporary dialysis catheter placed on 10/18 after right permcath was removed  - Cefazolin for 6 weeks  3. Anemia of chronic kidney disease: hemoglobin down a bit 7.2.  Continue to monitor closely. - Epogen 10000 units on next treatment.   4. Secondary Hyperparathyroidism:  - continue calcitriol 0.13mg  - Patient on renvela outpatient 800 mg po tid with meals.   5. Hypertension:  - start midodrine with hemodialysis.   6. Peripheral vascular disease: left first toe with gangrene and pain.  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 Agree with above plan.   SLavonia Dana [facility]ld [facility]  10/23/20194:49 PM

## 2018-02-11 NOTE — Progress Notes (Signed)
Pre HD Assessment    02/11/18 1305  Neurological  Level of Consciousness Alert  Orientation Level Oriented X4  Respiratory  Respiratory Pattern Regular;Unlabored;Symmetrical  Chest Assessment Chest expansion symmetrical  Bilateral Breath Sounds Diminished  Cough Weak  Cardiac  Pulse Regular  Heart Sounds S1, S2  Jugular Venous Distention (JVD) No  ECG Monitor Yes  Cardiac Rhythm NSR  Ectopy Unifocal PVC's  Ectopy Frequency Frequent  Antiarrhythmic device No  Vascular  R Radial Pulse +1  L Radial Pulse +1  Edema Left lower extremity  LLE Edema +1  Integumentary  Integumentary (WDL) X  Skin Color Appropriate for ethnicity  Skin Condition Dry  Skin Integrity Surgical Incision (see LDA)  Musculoskeletal  Musculoskeletal (WDL) X  Generalized Weakness Yes  GU Assessment  Genitourinary (WDL) X (HD pt)  Psychosocial  Psychosocial (WDL) WDL

## 2018-02-11 NOTE — Care Management Important Message (Signed)
Copy of signed IM left in patient's room (out for procedure). 

## 2018-02-11 NOTE — Care Management (Addendum)
Per bedside RN patient was sent to HD sitting in chair   Per Elvera Bicker [facility] [place] outpatient schedule is as follows [facility] [address] TTS at 1120

## 2018-02-11 NOTE — Progress Notes (Signed)
Post HD Treatment  Pt tolerated treatment. She did experience some hypoglycemic episodes during treatment and required a bolus. Dr Bridgett Larsson consulted patient during treatment. Dr. Lanney Gins consulted patient last 45 minutes of treatment. Patient verbalized understanding of all information. Patient's Net UF was 2 and her BVP was 53.6. She did receive Epogen during treatment. All blood was returned and report called to primary RN. Primary RN unavailable so report was accepted by Delbert Harness, RN.    02/11/18 1635  Hand-Off documentation  Report given to (Full Name) Delbert Harness, RN  Report received from (Full Name) Stephannie Peters, RN  Vital Signs  Temp 98.3 F (36.8 C)  Temp Source Oral  Pulse Rate 70  Pulse Rate Source Monitor  Resp 15  BP (!) 150/50  BP Location Right Arm  BP Method Automatic  Patient Position (if appropriate) Sitting  Oxygen Therapy  SpO2 100 %  O2 Device Room Air  Pain Assessment  Pain Scale 0-10  Pain Score 0  Dialysis Weight  Weight  (unable to weigh)  Post-Hemodialysis Assessment  Rinseback Volume (mL) 250 mL  KECN 222 V  Dialyzer Clearance Lightly streaked  Duration of HD Treatment -hour(s) 3 hour(s)  Hemodialysis Intake (mL) 600 mL  UF Total -Machine (mL) 602 mL  Net UF (mL) 2 mL  Tolerated HD Treatment Yes  Post-Hemodialysis Comments See note  Hemodialysis Catheter Right Femoral vein Triple-lumen;Temporary  Placement Date/Time: 02/06/18 (c)   Placed prior to admission: No  Orientation: Right  Access Location: Femoral vein  Hemodialysis Catheter Type: Triple-lumen;Temporary  Dressing Type Biopatch;Occlusive  Dressing Status Clean;Dry;Intact  Hemodialysis Catheter Left Subclavian Double-lumen  Placement Date: 02/11/18   Placed prior to admission: No  Orientation: Left  Access Location: Subclavian  Hemodialysis Catheter Type: Double-lumen  Site Condition No complications  Blue Lumen Status Capped (Central line)  Red Lumen Status Capped (Central  line)  Purple Lumen Status N/A  Catheter fill solution Heparin 1000 units/ml  Catheter fill volume (Arterial) 1.7 cc  Catheter fill volume (Venous) 1.7  Dressing Type Gauze/Drain sponge  Dressing Status Clean;Dry;Intact  Drainage Description None  Dressing Change Due 02/12/18  Post treatment catheter status Capped and Clamped

## 2018-02-11 NOTE — Progress Notes (Signed)
Pre HD Treatment    02/11/18 1315  Vital Signs  Temp 98 F (36.7 C)  Temp Source Oral  Pulse Rate 63  Pulse Rate Source Monitor  Resp (!) 22  BP (!) 145/46  BP Location Right Arm  BP Method Automatic  Patient Position (if appropriate) Lying  Oxygen Therapy  SpO2 96 %  O2 Device Room Air  Pain Assessment  Pain Scale 0-10  Pain Score 0  Dialysis Weight  Weight  (pt in chair unable to stand)  Type of Weight Pre-Dialysis  Time-Out for Hemodialysis  What Procedure? HD  Pt Identifiers(min of two) First/Last Name;MRN/Account#;Pt's DOB(use if MRN/Acct# not available  Correct Site? Yes  Correct Side? Yes  Correct Procedure? Yes  Consents Verified? Yes  Rad Studies Available? N/A  Safety Precautions Reviewed? Yes  Engineer, civil (consulting) Number 5 (5A)  Station Number 1  UF/Alarm Test Passed  Conductivity: Meter 14  Conductivity: Machine  13.9  pH 7.6  Reverse Osmosis Main  Normal Saline Lot Number [id number]  Dialyzer Lot Number [id number]  Disposable Set Lot Number [id number]  Machine Temperature 98.6 F (37 C)  Musician and Audible Yes  Blood Lines Intact and Secured Yes  Pre Treatment Patient Checks  Vascular access used during treatment Catheter  Patient is receiving dialysis in a chair Yes  Hepatitis B Surface Antigen Results Negative  Date Hepatitis B Surface Antigen Drawn 11/26/17  Hepatitis B Surface Antibody  (147)  Date Hepatitis B Surface Antibody Drawn 11/26/17  Hemodialysis Consent Verified Yes  Hemodialysis Standing Orders Initiated Yes  ECG (Telemetry) Monitor On Yes  Prime Ordered Normal Saline  Length of  DialysisTreatment -hour(s) 3 Hour(s)  Dialyzer Elisio 17H NR  Dialysate 2K, 2.5 Ca  Dialysis Anticoagulant None  Dialysate Flow Ordered 600  Blood Flow Rate Ordered 350 mL/min  Ultrafiltration Goal 0 Liters  Pre Treatment Labs CBC;Other (Comment)  Dialysis Blood Pressure Support Ordered Normal Saline  Education / Care Plan  Dialysis  Education Provided Yes  Documented Education in Care Plan Yes  Hemodialysis Catheter Right Femoral vein Triple-lumen;Temporary  Placement Date/Time: 02/06/18 (c)   Placed prior to admission: No  Orientation: Right  Access Location: Femoral vein  Hemodialysis Catheter Type: Triple-lumen;Temporary  Blue Lumen Status Capped (Central line)  Red Lumen Status Capped (Central line)  Purple Lumen Status Saline locked  Dressing Type Biopatch;Occlusive  Dressing Status Clean;Dry;Intact  Hemodialysis Catheter Left Subclavian Double-lumen  Placement Date: 02/11/18   Placed prior to admission: No  Orientation: Left  Access Location: Subclavian  Hemodialysis Catheter Type: Double-lumen  Site Condition No complications  Blue Lumen Status Capped (Central line)  Red Lumen Status Capped (Central line)  Purple Lumen Status N/A  Catheter fill solution  (unknown)  Catheter fill volume (Arterial) 1.7 cc  Catheter fill volume (Venous) 1.7  Dressing Type Gauze/Drain sponge  Dressing Status Clean;Dry;Intact

## 2018-02-11 NOTE — Progress Notes (Signed)
Patient returned to floor after having perm cath placed with an active nose bleed. Nurse that gave report said that the nose bleed started in specials and she also coughed up a blood clot. Providers have been notified of incident. Will continue to monitor patient and follow any orders given. Patient has been educated to hold head down and pinch at bridge of nose to try and stop bleeding.

## 2018-02-11 NOTE — H&P (Signed)
Pulmonary Medicine Consultation           Date: 02/11/2018,   MRN# [id number] Stephanie Ellis [birth year] Code Status:  FULL CODE    Code Status Orders  (From admission, onward)         Start     Ordered   02/01/18 0413  Full code  Continuous     02/01/18 0412        Code Status History    Date Active Date Inactive Code Status Order ID Comments User Context   01/31/2018 2300 02/01/2018 0412 DNR [id number]  Amelia Jo, MD Inpatient   01/31/2018 2037 01/31/2018 2300 DNR [id number]  Awilda Bill, NP Inpatient   01/29/2018 2250 01/31/2018 2037 Full Code [id number]  Amelia Jo, MD Inpatient   10/24/2016 1055 10/25/2016 2243 Full Code [id number]  Hillary Bow, MD ED   10/17/2016 2143 10/19/2016 1440 Full Code [id number]  Vaughan Basta, MD Inpatient   09/04/2016 1708 09/05/2016 1500 Full Code [id number]  Epifanio Lesches, MD ED   08/22/2016 0938 08/22/2016 1833 Full Code [id number]  Algernon Huxley, MD Inpatient   06/27/2016 1121 06/27/2016 1846 Full Code [id number]  Algernon Huxley, MD Inpatient   10/03/2015 2235 10/04/2015 1724 Full Code [id number]  Harrie Foreman, MD Inpatient     Hosp day:@LENGTHOFSTAYDAYS @ Referring MD: @ATDPROV @     Consulted by Dr Bridgett Larsson      AdmissionWeight: 59 kg                 CurrentWeight: (pt in chair unable to stand) Stephanie Ellis is a 67 y.o. old female seen in consultation for hemoptysis at the request of Dr Marchia Bond.     CHIEF COMPLAINT:   Hemoptysis   HISTORY OF PRESENT ILLNESS   Stephanie Ellis  is a 67 y.o. female with a known history of ESRD on HD t/th/s, peripheral neuropathy, CHF, hypertension, type 2 diabetes and other comorbidities. She was admitted with lethargy and signs of sepsis found to be febrile with elevated lactate and leukocytosis with microbiology significant for multiple blood cultures +MSSA and subsequently treated with broad regimen with noted clinical improvement. Today patient is lucid and able to answer most  questions quickly and is appropriate.  She describes few episodes of epistaxis and states she likely coughed up some clots or had blood streaked expectorate.  She denies having hemoptysis in the past but does admit to multiple episodes of epistaxis which occur spontaneously.   She denies having chest pain, chills, dizziness, falls and states shes able to take care of most ADLs independently at home including driving her car.  Recently she has been hindered by L foot ischemic changes.     PAST MEDICAL HISTORY   Past Medical History:  Diagnosis Date  . (HFpEF) heart failure with preserved ejection fraction ([place])    a. 01/2018 Echo: EF 50-55%, no rwma, Gr1 DD, triv AI, mild MR. Midly dil LA/RV, mod reduced RV fxn. Irreg thickening of TV w/ mobile echodensity that appears to arise from valve.  . Anemia   . Anorexia   . Bacteremia due to Pseudomonas   . Carotid arterial disease ([place])    a. 01/2017 Carotid U/S: RICA <12, LICA <75.  Marland Kitchen Complication of anesthesia    a. Pt reports h/o complication on 9 different occasions - ? hypotension/arrest.  . 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 01/2015 Cath [facility]): 70% LM, p-mLAD 50-60% (Resting FFR 0.75), mRCA 80-90%, ~40  Ost OM & D1-->CABG; b. 01/2016 Staged PCI of LCX x 2 and RCA.  Marland Kitchen ESRD (end stage renal disease) on dialysis ([place])    a. ESRD secondary to acute kidney failure s/p CABG-->PD  . Essential hypertension   . GERD (gastroesophageal reflux disease)   . Heart murmur   . Hypercholesterolemia   . Myocardial infarction ([place]) 2016  . PVD (peripheral vascular disease) ([place])    a. 01/30/2018 PV Angio: Sev Left Popliteal dzs s/p PTA w/ 66m lutonix DEB w/ mech aspiration of the L popliteal, L AT, and Left tibioperoneal trunck and peroneal artery.  . 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  [facility]: Dr. BMarland KitchenHaithcock: CABG x 3, LIMA to LAD, SVG to RCA, SVG to OM3, EVH  . Sinusitis 2019  . Type II diabetes mellitus with  complication (HCC)    CAD     SURGICAL HISTORY   Past Surgical History:  Procedure Laterality Date  . ABDOMINAL HYSTERECTOMY    . AMPUTATION TOE Left 02/06/2018   Procedure: AMPUTATION GREAT TOE;  Surgeon: FSamara Deist DPM;  Location: [facility] OR[facility];  Service: Podiatry;  Laterality: Left;  . ARTERIOVENOUS GRAFT PLACEMENT  05/2016  . AV FISTULA PLACEMENT Left 05/30/2016   Procedure: ARTERIOVENOUS graft;  Surgeon: JAlgernon Huxley MD;  Location: [facility];  Service: Vascular;  Laterality: Left;  . CAPD INSERTION N/A 10/30/2017   Procedure: LAPAROSCOPIC INSERTION CONTINUOUS AMBULATORY PERITONEAL DIALYSIS  (CAPD) CATHETER;  Surgeon: DAlgernon Huxley MD;  Location: [facility];  Service: Vascular;  Laterality: N/A;  . CARDIAC CATHETERIZATION  01/2015   [facility]: Ost LM 70%, p-m LAD 50-60% (Rest FFR + @ 0.75), mRCA 80-90%, ostD1 40%, pOM1 40%  . CATARACT EXTRACTION W/PHACO Left 01/18/2016   Procedure: CATARACT EXTRACTION PHACO AND INTRAOCULAR LENS PLACEMENT (IOC);  Surgeon: BEulogio Bear MD;  Location: [facility];  Service: Ophthalmology;  Laterality: Left;  [place]1.05 AP% 15.5 CDE 10.16 Fluid Pack Lot # [id number]  . CATARACT EXTRACTION W/PHACO Right 08/01/2016   Procedure: CATARACT EXTRACTION PHACO AND INTRAOCULAR LENS PLACEMENT (IOC);  Surgeon: BEulogio Bear MD;  Location: [facility];  Service: Ophthalmology;  Laterality: Right;  [place]01:00.6 AP% 11.4 CDE 6.93  LOT # [id number]  . COLONOSCOPY    . CORONARY ANGIOPLASTY     SENTS 02/12/16  . CORONARY ARTERY BYPASS GRAFT  03/28/15    [facility]: Dr. HWaldemar Dickens LIMA to LAD, SVG to RCA, SVG to OM3, EVH  . DIALYSIS/PERMA CATHETER INSERTION N/A 02/11/2018   Procedure: DIALYSIS/PERMA CATHETER INSERTION;  Surgeon: DAlgernon Huxley MD;  [place];  Service: Cardiovascular;  Laterality: N/A;  . DIALYSIS/PERMA CATHETER REMOVAL Right 02/04/2018   Procedure: DIALYSIS/PERMA CATHETER REMOVAL;  Surgeon: SKatha Cabal MD;  [place];   Service: Cardiovascular;  Laterality: Right;  . ESOPHAGOGASTRODUODENOSCOPY (EGD) WITH PROPOFOL N/A 10/24/2016   Procedure: ESOPHAGOGASTRODUODENOSCOPY (EGD) WITH PROPOFOL;  Surgeon: WLucilla Lame MD;  Location: [facility];  Service: Endoscopy;  Laterality: N/A;  . EYE SURGERY Bilateral    cataract surgery  . EYE SURGERY     drains for glaucoma  . INSERTION EXPRESS TUBE SHUNT Right 08/01/2016   Procedure: INSERTION EXPRESS TUBE SHUNT;  Surgeon: BEulogio Bear MD;  Location: [facility];  Service: Ophthalmology;  Laterality: Right;  . INSERTION OF AHMED VALVE Left 08/15/2016   Procedure: INSERTION OF AHMED VALVE;  Surgeon: BEulogio Bear MD;  Location: [facility];  Service: Ophthalmology;  Laterality: Left;  .  INSERTION OF DIALYSIS CATHETER    . LOWER EXTREMITY ANGIOGRAPHY Left 01/30/2018   Procedure: Lower Extremity Angiography;  Surgeon: Katha Cabal, MD;  Location: [place] [facility];  Service: Cardiovascular;  Laterality: Left;  . TEMPORARY DIALYSIS CATHETER N/A 02/06/2018   Procedure: TEMPORARY DIALYSIS CATHETER;  Surgeon: Katha Cabal, MD;  Location: [place] [facility];  Service: Cardiovascular;  Laterality: N/A;  . TRANSTHORACIC ECHOCARDIOGRAM  January 2017    EF 60-65%. GR 2 DD. Mild degenerative mitral valve disease but no prolapse or regurgitation. Mild left atrial dilation. Mild to moderate LVH. Pericardial effusion gone  . UPPER EXTREMITY ANGIOGRAPHY Left 06/27/2016   Procedure: Upper Extremity Angiography;  Surgeon: Algernon Huxley, MD;  Location: [place] [facility];  Service: Cardiovascular;  Laterality: Left;  . UPPER EXTREMITY ANGIOGRAPHY Left 08/22/2016   Procedure: Upper Extremity Angiography;  Surgeon: Algernon Huxley, MD;  Location: [place] [facility];  Service: Cardiovascular;  Laterality: Left;     FAMILY HISTORY   Family History  Problem Relation Age of Onset  . Diabetes Mellitus II Mother   . Pancreatic cancer Father      SOCIAL HISTORY   Social  History   Tobacco Use  . Smoking status: Never Smoker  . Smokeless tobacco: Never Used  Substance Use Topics  . Alcohol use: No  . Drug use: No     MEDICATIONS    Home Medication:    Current Medication:  Current Facility-Administered Medications:  .  acetaminophen (TYLENOL) tablet 650 mg, 650 mg, Oral, Q6H PRN, 650 mg at 02/08/18 0158 **OR** acetaminophen (TYLENOL) suppository 650 mg, 650 mg, Rectal, Q6H PRN, Algernon Huxley, MD .  acetaminophen (TYLENOL) tablet 650 mg, 650 mg, Oral, Q8H, Dew, Erskine Squibb, MD, 650 mg at 02/10/18 0529 .  aspirin EC tablet 81 mg, 81 mg, Oral, Daily, Dew, Erskine Squibb, MD, 81 mg at 02/10/18 1250 .  atorvastatin (LIPITOR) tablet 80 mg, 80 mg, Oral, QHS, Algernon Huxley, MD, 80 mg at 02/10/18 2315 .  bisacodyl (DULCOLAX) suppository 10 mg, 10 mg, Rectal, Daily, Dew, Erskine Squibb, MD, 10 mg at 02/08/18 1655 .  calcitRIOL (ROCALTROL) capsule 0.5 mcg, 0.5 mcg, Oral, Q breakfast, Dew, Erskine Squibb, MD .  carvedilol (COREG) tablet 3.125 mg, 3.125 mg, Oral, BID WC, Algernon Huxley, MD, 3.125 mg at 02/10/18 1728 .  ceFAZolin (ANCEF) 1-4 GM/50ML-% IVPB, , , ,  .  ceFAZolin (ANCEF) IVPB 1 g/50 mL premix, 1 g, Intravenous, Q24H, Dew, Erskine Squibb, MD, Last Rate: 100 mL/hr at 02/10/18 2330, 1 g at 02/10/18 2330 .  ceFAZolin (ANCEF) IVPB 1 g/50 mL premix, 1 g, Intravenous, On Call, Dew, Erskine Squibb, MD .  Chlorhexidine Gluconate Cloth 2 % PADS 6 each, 6 each, Topical, Q0600, Algernon Huxley, MD, 6 each at 02/11/18 0500 .  cholecalciferol (VITAMIN D) tablet 400 Units, 400 Units, Oral, Once per day on Mon Wed Fri, Dew, Jason S, MD, 400 Units at 02/06/18 0806 .  docusate sodium (COLACE) capsule 100 mg, 100 mg, Oral, BID, Dew, Erskine Squibb, MD, 100 mg at 02/10/18 2317 .  epoetin alfa (EPOGEN,PROCRIT) injection 4,000 Units, 4,000 Units, Intravenous, Q M,W,F-HD, Algernon Huxley, MD, 4,000 Units at 02/07/18 1308 .  famotidine (PEPCID) tablet 20 mg, 20 mg, Oral, Daily, Dew, Erskine Squibb, MD, 20 mg at 02/10/18 1250 .  feeding  supplement (NEPRO CARB STEADY) liquid 237 mL, 237 mL, Oral, BID BM, Algernon Huxley, MD, 237 mL at 02/08/18  4742 .  HYDROmorphone (DILAUDID) injection 0.5 mg, 0.5 mg, Intravenous, Q2H PRN, Algernon Huxley, MD, 0.5 mg at 02/10/18 0929 .  HYDROmorphone (DILAUDID) injection 0.5 mg, 0.5 mg, Intravenous, Once, Dew, Erskine Squibb, MD .  insulin aspart (novoLOG) injection 0-5 Units, 0-5 Units, Subcutaneous, QHS, Algernon Huxley, MD, 2 Units at 02/09/18 2204 .  insulin aspart (novoLOG) injection 0-9 Units, 0-9 Units, Subcutaneous, TID WC, Algernon Huxley, MD, 2 Units at 02/11/18 [phone] .  isosorbide mononitrate (IMDUR) 24 hr tablet 30 mg, 30 mg, Oral, Daily, Dew, Erskine Squibb, MD, 30 mg at 02/10/18 1250 .  LORazepam (ATIVAN) injection 0.5 mg, 0.5 mg, Intravenous, Once, Dew, Erskine Squibb, MD .  metoCLOPramide (REGLAN) injection 5 mg, 5 mg, Intravenous, Q8H PRN, Algernon Huxley, MD, 5 mg at 01/31/18 1605 .  mirtazapine (REMERON) tablet 7.5 mg, 7.5 mg, Oral, QHS, Dew, Erskine Squibb, MD .  multivitamin (RENA-VIT) tablet 1 tablet, 1 tablet, Oral, QHS, Algernon Huxley, MD, 1 tablet at 02/09/18 2203 .  multivitamin with minerals tablet 1 tablet, 1 tablet, Oral, Daily, Dew, Erskine Squibb, MD, 1 tablet at 02/10/18 1249 .  multivitamin-lutein (OCUVITE-LUTEIN) capsule 1 capsule, 1 capsule, Oral, Daily, Dew, Erskine Squibb, MD, 1 capsule at 02/10/18 1256 .  nitroGLYCERIN (NITROSTAT) SL tablet 0.4 mg, 0.4 mg, Sublingual, Q5 min PRN, Lucky Cowboy, Erskine Squibb, MD .  ondansetron (ZOFRAN) tablet 4 mg, 4 mg, Oral, Q6H PRN **OR** ondansetron (ZOFRAN) injection 4 mg, 4 mg, Intravenous, Q6H PRN, Algernon Huxley, MD, 4 mg at 02/10/18 0744 .  oxyCODONE (Oxy IR/ROXICODONE) immediate release tablet 5 mg, 5 mg, Oral, Q3H PRN, Algernon Huxley, MD, 5 mg at 02/08/18 1640 .  sevelamer carbonate (RENVELA) tablet 1,600 mg, 1,600 mg, Oral, TID WC, Algernon Huxley, MD, 1,600 mg at 02/10/18 1724 .  traZODone (DESYREL) tablet 25 mg, 25 mg, Oral, QHS PRN, Dew, Erskine Squibb, MD    ALLERGIES   Chlorthalidone; Fentanyl;  Midazolam; Ace inhibitors; Angiotensin receptor blockers; Norvasc [amlodipine]; and Phenergan [promethazine hcl]     REVIEW OF SYSTEMS    Review of Systems:  Gen:  Denies  fever, sweats, chills weigh loss  HEENT: Denies blurred vision, double vision, ear pain, eye pain, hearing loss, nose bleeds, sore throat Cardiac:  No dizziness, chest pain or heaviness, chest tightness,edema Resp:   Denies cough or sputum porduction, shortness of breath,wheezing, hemoptysis,  Gi: Denies swallowing difficulty, stomach pain, nausea or vomiting, diarrhea, constipation, bowel incontinence Gu:  Denies bladder incontinence, burning urine Ext:   Denies Joint pain, stiffness or swelling left leg pain Skin: Denies  skin rash, easy bruising or bleeding or hives Endoc:  Denies polyuria, polydipsia , polyphagia or weight change Psych:   Denies depression, insomnia or hallucinations   Other:  All other systems negative ALL OTHER ROS ARE NEGATIVE  VS: BP (!) 100/43 (BP Location: Right Arm)   Pulse 68   Temp 98 F (36.7 C) (Oral)   Resp 12   Ht 5' 4"  (1.626 m)   Wt 57.5 kg   SpO2 99%   BMI 21.76 kg/m      PHYSICAL EXAM  Physical Examination:   GENERAL:NAD, no fevers, chills, no weakness no fatigue HEAD: Normocephalic, atraumatic.  EYES: Pupils equal, round, reactive to light. Extraocular muscles intact. No scleral icterus.  MOUTH: Moist mucosal membrane. Dentition intact. No abscess noted.  EAR, NOSE, THROAT: Clear without exudates. No external lesions.  Dry clotts in nares bilaterally NECK: Supple. No thyromegaly. No nodules.  No JVD.  PULMONARY: Diffuse coarse rhonchi right sided +wheezes CARDIOVASCULAR: S1 and S2. Regular rate and rhythm. No murmurs, rubs, or gallops. No edema. Pedal pulses 2+ bilaterally.  GASTROINTESTINAL: Soft, nontender, nondistended. No masses. Positive bowel sounds. No hepatosplenomegaly.  MUSCULOSKELETAL: No swelling, clubbing, or edema. left lower extermity  hyperpigmentation and weak pulse suggestive of limb ischemia with possible overlying infection of distal great halux  NEUROLOGIC: Cranial nerves II through XII are intact. No gross focal neurological deficits. Sensation intact. Reflexes intact.  SKIN: No ulceration, lesions, rashes, or cyanosis. Skin warm and dry. Turgor intact.  PSYCHIATRIC: Mood, affect within normal limits. The patient is awake, alert and oriented x 3. Insight, judgment intact.       IMAGING    Dg Tibia/fibula Left  Result Date: 01/29/2018 CLINICAL DATA:  Presents with ED via ems from home Having pain to both lower legs But states left leg is worse Increased pain with palpation Decreased pulses to foot EXAM: LEFT TIBIA AND FIBULA - 2 VIEW COMPARISON:  None. FINDINGS: No fracture.  No bone lesion. Knee and ankle joints are normally aligned. There are arterial vascular calcifications. Soft tissues are otherwise unremarkable. IMPRESSION: Negative Electronically Signed   By: Lajean Manes M.D.   On: 01/29/2018 20:58   US Venous Img Lower Unilateral Left  Result Date: 01/29/2018 CLINICAL DATA:  Left leg pain EXAM: LEFT LOWER EXTREMITY VENOUS DOPPLER ULTRASOUND TECHNIQUE: Gray-scale sonography with graded compression, as well as color Doppler and duplex ultrasound were performed to evaluate the lower extremity deep venous systems from the level of the common femoral vein and including the common femoral, femoral, profunda femoral, popliteal and calf veins including the posterior tibial, peroneal and gastrocnemius veins when visible. The superficial great saphenous vein was also interrogated. Spectral Doppler was utilized to evaluate flow at rest and with distal augmentation maneuvers in the common femoral, femoral and popliteal veins. COMPARISON:  None. FINDINGS: Contralateral Common Femoral Vein: Respiratory phasicity is normal and symmetric with the symptomatic side. No evidence of thrombus. Normal compressibility. Common Femoral  Vein: No evidence of thrombus. Normal compressibility, respiratory phasicity and response to augmentation. Saphenofemoral Junction: No evidence of thrombus. Normal compressibility and flow on color Doppler imaging. Profunda Femoral Vein: No evidence of thrombus. Normal compressibility and flow on color Doppler imaging. Femoral Vein: No evidence of thrombus. Normal compressibility, respiratory phasicity and response to augmentation. Popliteal Vein: No evidence of thrombus. Normal compressibility, respiratory phasicity and response to augmentation. Calf Veins: No evidence of thrombus. Normal compressibility and flow on color Doppler imaging. Superficial Great Saphenous Vein: No evidence of thrombus. Normal compressibility. Venous Reflux:  None. Other Findings:  None. IMPRESSION: No evidence of deep venous thrombosis. Electronically Signed   By: Rolm Baptise M.D.   On: 01/29/2018 18:36   Dg Toe Great Left  Result Date: 01/29/2018 CLINICAL DATA:  Open wound to the left great toe for several months. EXAM: LEFT GREAT TOE COMPARISON:  10/07/2017 FINDINGS: Degenerative changes in the first metatarsal-phalangeal joint. Soft tissue defect over the plantar aspect of the left first toe. There is underlying erosion of the distal phalangeal tuft cortex suggesting focal osteomyelitis. No evidence of acute fracture or dislocation. Diffuse bone demineralization. Prominent vascular calcifications. IMPRESSION: Soft tissue defect over the plantar aspect of the left first toe with underlying erosion of the distal phalangeal tuft suggesting osteomyelitis. Electronically Signed   By: Lucienne Capers M.D.   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 All questions answered  Thank you for consultation, will continue to follow with you,       Ottie Glazier, MD Division of Pulmonary and Critical Care Medicine 3:56 PM 02/11/2018

## 2018-02-11 NOTE — Op Note (Signed)
OPERATIVE NOTE    PRE-OPERATIVE DIAGNOSIS: 1. ESRD 2. Recently removed right permcath, endocarditis  POST-OPERATIVE DIAGNOSIS: same as above  PROCEDURE: 1. Ultrasound guidance for vascular access to the left internal jugular vein 2. Fluoroscopic guidance for placement of catheter 3. Placement of a 23 cm tip to cuff tunneled hemodialysis catheter via the left internal jugular vein  SURGEON: Leotis Pain, MD  ANESTHESIA:  Local with Moderate conscious sedation for approximately 20 minutes using 0.5 mg of Ativan and 0.5 mg of Dilaudid  ESTIMATED BLOOD LOSS: 5 cc  FLUORO TIME: less than one minute  CONTRAST: none  FINDING(S): 1.  Patent left internal jugular vein  SPECIMEN(S):  None  INDICATIONS:   Stephanie Ellis is a 66 y.o. 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  The patient needs long term dialysis access for their ESRD, and a Permcath is necessary.  Risks and benefits are discussed and informed consent is obtained.    DESCRIPTION: After obtaining full informed written consent, the patient was brought back to the vascular suited. The patient's left neck and chest were sterilely prepped and draped in a sterile surgical field was created. Moderate conscious sedation was administered during a face to face encounter with the patient throughout the procedure with my supervision of the RN administering medicines and monitoring the patient's vital signs, pulse oximetry, telemetry and mental status throughout from the start of the procedure until the patient was taken to the recovery room.  The left internal jugular vein was visualized with ultrasound and found to be patent. It was then accessed under direct ultrasound guidance and a permanent image was recorded. A wire was placed. After skin nick and dilatation, the peel-away sheath was placed over the wire. I then turned my attention to an area under the clavicle. Approximately  1-2 fingerbreadths below the clavicle a small counterincision was created and tunneled from the subclavicular incision to the access site. Using fluoroscopic guidance, a 23 centimeter tip to cuff tunneled hemodialysis catheter was selected, and tunneled from the subclavicular incision to the access site. It was then placed through the peel-away sheath and the peel-away sheath was removed. Using fluoroscopic guidance the catheter tips were parked in the right atrium. The appropriate distal connectors were placed. It withdrew blood well and flushed easily with heparinized saline and a concentrated heparin solution was then placed. It was secured to the chest wall with 2 Prolene sutures. The access incision was closed single 4-0 Monocryl. A 4-0 Monocryl pursestring suture was placed around the exit site. Sterile dressings were placed. The patient tolerated the procedure well and was taken to the recovery room in stable condition.  COMPLICATIONS: None  CONDITION: Stable  Leotis Pain  02/11/2018, 10:53 AM   This note was created with Dragon Medical transcription system. Any errors in dictation are purely unintentional.

## 2018-02-11 NOTE — Progress Notes (Signed)
HD Treatment Complete    02/11/18 1630  Vital Signs  Pulse Rate 65  Pulse Rate Source Monitor  Resp 10  BP (!) 154/50  BP Location Right Arm  BP Method Automatic  Patient Position (if appropriate) Sitting  Oxygen Therapy  SpO2 98 %  O2 Device Room Air  During Hemodialysis Assessment  Blood Flow Rate (mL/min) 300 mL/min  Arterial Pressure (mmHg) -160 mmHg  Venous Pressure (mmHg) 100 mmHg  Transmembrane Pressure (mmHg) 40 mmHg  Ultrafiltration Rate (mL/min) 520 mL/min  Dialysate Flow Rate (mL/min) 600 ml/min  Conductivity: Machine  13.7  HD Safety Checks Performed Yes  Intra-Hemodialysis Comments See progress note;Tx completed

## 2018-02-11 NOTE — Progress Notes (Signed)
[place] at [facility]                                                                                                                                                                                  Patient Demographics   Stephanie Ellis, is a 67 y.o. female, DOB - November 27, 1950, FTD:[id number]  Admit date - 01/29/2018   Admitting Physician Amelia Jo, MD  Outpatient Primary MD for the patient is System, Pcp Not In   LOS - 13  Subjective:  The patient hemoptysis and epistaxis during perma cath procedure.  Review of Systems:   CONSTITUTIONAL: No documented fever. No fatigue, weakness. No weight gain, no weight loss.  EYES: No blurry or double vision.  ENT: No tinnitus. No postnasal drip. No redness of the oropharynx.  RESPIRATORY: No cough, no wheeze, no hemoptysis. No dyspnea. hemoptysis and epistaxis. CARDIOVASCULAR: No chest pain. No orthopnea. No palpitations. No syncope.  GASTROINTESTINAL: Had nausea and vomiting, no abdominal pain.   GENITOURINARY: No dysuria or hematuria.  ENDOCRINE: No polyuria or nocturia. No heat or cold intolerance.  HEMATOLOGY: No anemia. No bruising. No bleeding.  INTEGUMENTARY: No rashes. No lesions.  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 NEUROLOGIC: No numbness, tingling, or ataxia. No seizure-type activity.  PSYCHIATRIC: No anxiety. No insomnia. No ADD.    Vitals:   Vitals:   02/11/18 1430 02/11/18 1445 02/11/18 1500 02/11/18 1515  BP: (!) 98/40 (!) 110/43 (!) 99/40 (!) 105/41  Pulse: 69 66 65 66  Resp: 13 (!) 9 14 15   Temp:      TempSrc:      SpO2: 99% 98% 96% 98%  Weight:      Height:        Wt Readings from Last 3 Encounters:  02/10/18 57.5 kg  10/15/17 58.1 kg  10/09/17 58.1 kg     Intake/Output Summary (Last 24 hours) at 02/11/2018 1533 Last data filed at 02/11/2018 0818 Gross per 24 hour  Intake 341.87 ml  Output 0 ml  Net 341.87 ml    Physical Exam:   GENERAL:  Pleasant-appearing in no apparent distress.  HEAD, EYES, EARS, NOSE AND THROAT: Atraumatic, normocephalic. Extraocular muscles are intact. Pupils equal and reactive to light. Sclerae anicteric. No conjunctival injection. No oro-pharyngeal erythema.  NECK: Supple. There is no jugular venous distention. No bruits, no lymphadenopathy, no thyromegaly.  HEART: Regular rate and rhythm,. No murmurs, no rubs, no clicks.  LUNGS: Clear to auscultation bilaterally. No rales or rhonchi. No wheezes.  ABDOMEN: Soft, flat, nontender, nondistended. Has good bowel sounds. No hepatosplenomegaly appreciated.  EXTREMITIES: Status left great toe amputation in dressing. NEUROLOGIC: The patient is alert,  awake, and oriented x3 with no focal motor or sensory deficits appreciated bilaterally.  SKIN: Moist and warm with no rashes appreciated.  Psych: Not anxious, depressed LN: No inguinal LN enlargement    Antibiotics   Anti-infectives (From admission, onward)   Start     Dose/Rate Route Frequency Ordered Stop   02/11/18 0921  ceFAZolin (ANCEF) 1-4 GM/50ML-% IVPB    Note to Pharmacy:  Carlynn Spry   : cabinet override      02/11/18 0921 02/11/18 2129   02/11/18 0000  ceFAZolin (ANCEF) IVPB 1 g/50 mL premix    Note to Pharmacy:  Send with pt to OR   1 g 100 mL/hr over 30 Minutes Intravenous On call 02/10/18 2136 02/12/18 0000   02/06/18 0832  ceFAZolin (ANCEF) 1-4 GM/50ML-% IVPB    Note to Pharmacy:  Norton Blizzard  : cabinet override      02/06/18 0832 02/06/18 0855   02/06/18 0831  vancomycin (VANCOCIN) 1-5 GM/200ML-% IVPB  Status:  Discontinued    Note to Pharmacy:  Norton Blizzard  : cabinet override      02/06/18 0831 02/06/18 0846   01/31/18 2200  ceFAZolin (ANCEF) IVPB 1 g/50 mL premix     1 g 100 mL/hr over 30 Minutes Intravenous Every 24 hours 01/31/18 1409     01/30/18 1800  vancomycin (VANCOCIN) IVPB 1000 mg/200 mL premix  Status:  Discontinued     1,000 mg 200 mL/hr over 60 Minutes  Intravenous Every M-W-F (Hemodialysis) 01/30/18 0435 01/30/18 0906   01/30/18 1000  piperacillin-tazobactam (ZOSYN) IVPB 3.375 g  Status:  Discontinued     3.375 g 12.5 mL/hr over 240 Minutes Intravenous Every 12 hours 01/30/18 0435 01/30/18 0906   01/30/18 1000  ceFAZolin (ANCEF) 500 mg in dextrose 5 % 100 mL IVPB  Status:  Discontinued     500 mg 210 mL/hr over 30 Minutes Intravenous Every 12 hours 01/30/18 0906 01/31/18 1409   01/29/18 2200  piperacillin-tazobactam (ZOSYN) IVPB 3.375 g     3.375 g 100 mL/hr over 30 Minutes Intravenous  Once 01/29/18 2146 01/29/18 2242   01/29/18 2200  vancomycin (VANCOCIN) IVPB 1000 mg/200 mL premix     1,000 mg 200 mL/hr over 60 Minutes Intravenous  Once 01/29/18 2146 01/29/18 2314      Medications   Scheduled Meds: . acetaminophen  650 mg Oral Q8H  . aspirin EC  81 mg Oral Daily  . atorvastatin  80 mg Oral QHS  . bisacodyl  10 mg Rectal Daily  . calcitRIOL  0.5 mcg Oral Q breakfast  . carvedilol  3.125 mg Oral BID WC  . Chlorhexidine Gluconate Cloth  6 each Topical Q0600  . cholecalciferol  400 Units Oral Once per day on Mon Wed Fri  . docusate sodium  100 mg Oral BID  . epoetin (EPOGEN/PROCRIT) injection  4,000 Units Intravenous Q M,W,F-HD  . famotidine  20 mg Oral Daily  . feeding supplement (NEPRO CARB STEADY)  237 mL Oral BID BM  .  HYDROmorphone (DILAUDID) injection  0.5 mg Intravenous Once  . insulin aspart  0-5 Units Subcutaneous QHS  . insulin aspart  0-9 Units Subcutaneous TID WC  . isosorbide mononitrate  30 mg Oral Daily  . LORazepam  0.5 mg Intravenous Once  . mirtazapine  7.5 mg Oral QHS  . multivitamin  1 tablet Oral QHS  . multivitamin with minerals  1 tablet Oral Daily  . multivitamin-lutein  1 capsule Oral Daily  .  sevelamer carbonate  1,600 mg Oral TID WC   Continuous Infusions: . ceFAZolin    .  ceFAZolin (ANCEF) IV 1 g (02/10/18 2330)  .  ceFAZolin (ANCEF) IV     PRN Meds:.acetaminophen **OR** acetaminophen,  HYDROmorphone (DILAUDID) injection, metoCLOPramide (REGLAN) injection, nitroGLYCERIN, ondansetron **OR** ondansetron (ZOFRAN) IV, oxyCODONE, traZODone   Data Review:   Micro Results Recent Results (from the past 240 hour(s))  MRSA PCR Screening     Status: None   Collection Time: 02/05/18 11:23 PM  Result Value Ref Range Status   MRSA by PCR NEGATIVE NEGATIVE Final    Comment:        The GeneXpert MRSA Assay (FDA approved for NASAL specimens only), is one component of a comprehensive MRSA colonization surveillance program. It is not intended to diagnose MRSA infection nor to guide or monitor treatment for MRSA infections. Performed at [facility], [address] [place], [place] [address]   Aerobic/Anaerobic Culture (surgical/deep wound)     Status: None (Preliminary result)   Collection Time: 02/06/18  9:11 AM  Result Value Ref Range Status   Specimen Description TOE GREAT LEFT  Final   Special Requests   Final    NONE Performed at [facility], [address], [place], [place] [address]    Gram Stain   Final    NO WBC SEEN NO ORGANISMS SEEN Performed at [facility], [place] [address], [place], [place] [address]    Culture   Final    RARE STAPHYLOCOCCUS AUREUS NO ANAEROBES ISOLATED; CULTURE IN PROGRESS FOR 5 DAYS    Report Status PENDING  Incomplete   Organism ID, Bacteria STAPHYLOCOCCUS AUREUS  Final      Susceptibility   Staphylococcus aureus - MIC*    CIPROFLOXACIN <=0.5 SENSITIVE Sensitive     ERYTHROMYCIN <=0.25 SENSITIVE Sensitive     GENTAMICIN <=0.5 SENSITIVE Sensitive     OXACILLIN <=0.25 SENSITIVE Sensitive     TETRACYCLINE <=1 SENSITIVE Sensitive     VANCOMYCIN <=0.5 SENSITIVE Sensitive     TRIMETH/SULFA <=10 SENSITIVE Sensitive     CLINDAMYCIN <=0.25 SENSITIVE Sensitive     RIFAMPIN <=0.5 SENSITIVE Sensitive     Inducible Clindamycin NEGATIVE Sensitive     * RARE STAPHYLOCOCCUS AUREUS    Radiology Reports Dg Tibia/fibula  Left  Result Date: 01/29/2018 CLINICAL DATA:  Presents with ED via ems from home Having pain to both lower legs But states left leg is worse Increased pain with palpation Decreased pulses to foot EXAM: LEFT TIBIA AND FIBULA - 2 VIEW COMPARISON:  None. FINDINGS: No fracture.  No bone lesion. Knee and ankle joints are normally aligned. There are arterial vascular calcifications. Soft tissues are otherwise unremarkable. IMPRESSION: Negative Electronically Signed   By: Lajean Manes M.D.   On: 01/29/2018 20:58   US Venous Img Lower Unilateral Left  Result Date: 01/29/2018 CLINICAL DATA:  Left leg pain EXAM: LEFT LOWER EXTREMITY VENOUS DOPPLER ULTRASOUND TECHNIQUE: Gray-scale sonography with graded compression, as well as color Doppler and duplex ultrasound were performed to evaluate the lower extremity deep venous systems from the level of the common femoral vein and including the common femoral, femoral, profunda femoral, popliteal and calf veins including the posterior tibial, peroneal and gastrocnemius veins when visible. The superficial great saphenous vein was also interrogated. Spectral Doppler was utilized to evaluate flow at rest and with distal augmentation maneuvers in the common femoral, femoral and popliteal veins. COMPARISON:  None. FINDINGS: Contralateral Common Femoral Vein:  Respiratory phasicity is normal and symmetric with the symptomatic side. No evidence of thrombus. Normal compressibility. Common Femoral Vein: No evidence of thrombus. Normal compressibility, respiratory phasicity and response to augmentation. Saphenofemoral Junction: No evidence of thrombus. Normal compressibility and flow on color Doppler imaging. Profunda Femoral Vein: No evidence of thrombus. Normal compressibility and flow on color Doppler imaging. Femoral Vein: No evidence of thrombus. Normal compressibility, respiratory phasicity and response to augmentation. Popliteal Vein: No evidence of thrombus. Normal  compressibility, respiratory phasicity and response to augmentation. Calf Veins: No evidence of thrombus. Normal compressibility and flow on color Doppler imaging. Superficial Great Saphenous Vein: No evidence of thrombus. Normal compressibility. Venous Reflux:  None. Other Findings:  None. IMPRESSION: No evidence of deep venous thrombosis. Electronically Signed   By: Rolm Baptise M.D.   On: 01/29/2018 18:36   Dg Toe Great Left  Result Date: 01/29/2018 CLINICAL DATA:  Open wound to the left great toe for several months. EXAM: LEFT GREAT TOE COMPARISON:  10/07/2017 FINDINGS: Degenerative changes in the first metatarsal-phalangeal joint. Soft tissue defect over the plantar aspect of the left first toe. There is underlying erosion of the distal phalangeal tuft cortex suggesting focal osteomyelitis. No evidence of acute fracture or dislocation. Diffuse bone demineralization. Prominent vascular calcifications. IMPRESSION: Soft tissue defect over the plantar aspect of the left first toe with underlying erosion of the distal phalangeal tuft suggesting osteomyelitis. Electronically Signed   By: Lucienne Capers M.D.   On: 01/29/2018 21:30     CBC Recent Labs  Lab 02/08/18 0439 02/09/18 0803 02/10/18 0308 02/11/18 0350 02/11/18 1321  WBC 24.8* 23.8* 23.6* 20.3* 20.6*  HGB 7.7* 8.1* 7.9* 7.5* 7.2*  HCT 26.0* 27.1* 26.1* 26.0* 24.7*  PLT 232 249 276 284 295  MCV 100.4* 101.5* 101.2* 103.2* 103.3*  MCH 29.7 30.3 30.6 29.8 30.1  MCHC 29.6* 29.9* 30.3 28.8* 29.1*  RDW 21.2* 21.4* 21.8* 21.9* 22.2*    Chemistries  Recent Labs  Lab 02/06/18 0511 02/07/18 0545 02/08/18 0439 02/11/18 0350  NA  --  135 139 134*  K 5.3* 5.5* 4.3 5.3*  CL  --  90* 96* 92*  CO2  --  26 32 25  GLUCOSE  --  187* 244* 208*  BUN  --  62* 38* 75*  CREATININE  --  9.29* 5.74* 9.55*  CALCIUM  --  8.6* 7.9* 8.0*    ------------------------------------------------------------------------------------------------------------------ estimated creatinine clearance is 5 mL/min (A) (by C-G formula based on SCr of 9.55 mg/dL (H)). ------------------------------------------------------------------------------------------------------------------ No results for input(s): HGBA1C in the last 72 hours. ------------------------------------------------------------------------------------------------------------------ No results for input(s): CHOL, HDL, LDLCALC, TRIG, CHOLHDL, LDLDIRECT in the last 72 hours. ------------------------------------------------------------------------------------------------------------------ No results for input(s): TSH, T4TOTAL, T3FREE, THYROIDAB in the last 72 hours.  Invalid input(s): FREET3 ------------------------------------------------------------------------------------------------------------------ No results for input(s): VITAMINB12, FOLATE, FERRITIN, TIBC, IRON, RETICCTPCT in the last 72 hours.  Coagulation profile No results for input(s): INR, PROTIME in the last 168 hours.  No results for input(s): DDIMER in the last 72 hours.  Cardiac Enzymes No results for input(s): CKMB, TROPONINI, MYOGLOBIN in the last 168 hours.  Invalid input(s): CK ------------------------------------------------------------------------------------------------------------------ Invalid input(s): [place]  Patient admitted with sepsis   1.  Sepsis,  with MSSA with left great toe osteomyelitis, Tricuspid endocarditis.   Status post removal of her permacath by vascular and temporary dialysis catheter. Repeat blood cultures from 10/11 has been negative so far.  Still leukocytosis. Per Dr. Steva Ready, will need 6 weeks of  IV Cefazolin which can be given during dialysis so she does not need another catheter  2 grams, 2grams and 3 grams until 03/13/18 with weekly CBC/CMP.  Acute  ischemia of the left foot status post angioplasty of left popliteal by vascular , heparin drip was discontinued due to epistaxis.  Resumed Heparin drip.  But the patient has hemoptysis and epistaxis.  Discontinue heparin drip.  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 Dressing changed 3 x's per week. F/u with podiatry in 2 weeks per Dr. Vickki Muff.  2.    Acute encephalopathy due to #1 now improved.   3.  PAD.,  Left leg ischemia, status post hospitalization, continue aspirin and statins. Heparin drip is restarted, which is discontinued due to hemoptysis and epistaxis.   May start low dose Eliquis after permacath per Dr. Lucky Cowboy.   4.  End-stage renal disease on peritoneal dialysis.     Patient does not want peritoneal dialysis anymore. S/P right temp HD catheter. Permacath placement and hemodialysis today.   5.  Diabetes type 2.  controlled, continue insulin with sliding scale coverage. Continue carb controlled renal diet.  6.  Chronic diastolic CHF, LV EF: 81% -   55%,  currently clinically compensated, continue maintenance therapy.  Continue Coreg, Imdur, midodrine.  7.  Peripheral neuropathy, continue pain medication.  #8. nausea likely secondary to sepsis/narcotic induced: And possible diabetic gastroparesis: continue Reglan, PPI, improved.  #.9. Hyperkalemia.  Discontinued ibesartan, improved with hemodialysis. But K5.3 today.  Repeat BMP after hemodialysis.  hemoptysis and epistaxis.  Discontinued heparin drip, follow-up chest x-ray and pulmonary consult.  I discussed with Dr. Raul Del.  Anemia of chronic disease.  Hemoglobin decreased to 7.2.  Follow-up hemoglobin, PRBC transfusion PRN.  I discussed with Dr. Delana Meyer and Dr. Raul Del. PT suggest SNF.    Code Status Orders  (From admission, onward)         Start     Ordered   01/29/18 2250  Full code  Continuous     01/29/18 2250        Code Status History    Date Active Date Inactive Code Status Order  ID Comments User Context   10/24/2016 1055 10/25/2016 2243 Full Code [id number]  Hillary Bow, MD ED   10/17/2016 2143 10/19/2016 1440 Full Code [id number]  Vaughan Basta, MD Inpatient   09/04/2016 1708 09/05/2016 1500 Full Code [id number]  Epifanio Lesches, MD ED   08/22/2016 0938 08/22/2016 1833 Full Code [id number]  Algernon Huxley, MD Inpatient   06/27/2016 1121 06/27/2016 1846 Full Code [id number]  Algernon Huxley, MD Inpatient   10/03/2015 2235 10/04/2015 1724 Full Code [id number]  Harrie Foreman, MD Inpatient           Consults  NEPHROLOGY AND VASCULAR SURGERY   HEPRIN  Lab Results  Component Value Date   PLT 295 02/11/2018   High risk for cardiac arrest secondary to multiple medical problems. Time Spent in minutes 38 MIN   Greater than 50% of time spent in care coordination and counseling patient regarding the condition and plan of care.  Demetrios Loll M.D on 02/11/2018 at 3:33 PM  Between 7am to 6pm - Pager - [phone]  After 6pm go to [email or web address] - Proofreader  [employer]   Office  [phone]

## 2018-02-12 ENCOUNTER — Inpatient Hospital Stay: Payer: Medicare Other

## 2018-02-12 LAB — CBC
HCT: 26.4 % — ABNORMAL LOW (ref 36.0–46.0)
Hemoglobin: 7.6 g/dL — ABNORMAL LOW (ref 12.0–15.0)
MCH: 30 pg (ref 26.0–34.0)
MCHC: 28.8 g/dL — ABNORMAL LOW (ref 30.0–36.0)
MCV: 104.3 fL — ABNORMAL HIGH (ref 80.0–100.0)
Platelets: 307 10*3/uL (ref 150–400)
RBC: 2.53 MIL/uL — ABNORMAL LOW (ref 3.87–5.11)
RDW: 22.3 % — ABNORMAL HIGH (ref 11.5–15.5)
WBC: 17.2 10*3/uL — ABNORMAL HIGH (ref 4.0–10.5)
nRBC: 0.1 % (ref 0.0–0.2)

## 2018-02-12 LAB — AEROBIC/ANAEROBIC CULTURE W GRAM STAIN (SURGICAL/DEEP WOUND): Gram Stain: NONE SEEN

## 2018-02-12 LAB — GLUCOSE, CAPILLARY
Glucose-Capillary: 152 mg/dL — ABNORMAL HIGH (ref 70–99)
Glucose-Capillary: 105 mg/dL — ABNORMAL HIGH (ref 70–99)
Glucose-Capillary: 137 mg/dL — ABNORMAL HIGH (ref 70–99)
Glucose-Capillary: 252 mg/dL — ABNORMAL HIGH (ref 70–99)

## 2018-02-12 LAB — BASIC METABOLIC PANEL
Anion gap: 16 — ABNORMAL HIGH (ref 5–15)
BUN: 39 mg/dL — ABNORMAL HIGH (ref 8–23)
CO2: 29 mmol/L (ref 22–32)
Calcium: 7.9 mg/dL — ABNORMAL LOW (ref 8.9–10.3)
Chloride: 92 mmol/L — ABNORMAL LOW (ref 98–111)
Creatinine, Ser: 6.19 mg/dL — ABNORMAL HIGH (ref 0.44–1.00)
GFR calc Af Amer: 7 mL/min — ABNORMAL LOW (ref >60)
GFR calc non Af Amer: 6 mL/min — ABNORMAL LOW (ref >60)
Glucose, Bld: 155 mg/dL — ABNORMAL HIGH (ref 70–99)
Potassium: 4.6 mmol/L (ref 3.5–5.1)
Sodium: 137 mmol/L (ref 135–145)

## 2018-02-12 LAB — HEPARIN LEVEL (UNFRACTIONATED): Heparin Unfractionated: <0.1 IU/mL — ABNORMAL LOW (ref 0.30–0.70)

## 2018-02-12 LAB — AEROBIC/ANAEROBIC CULTURE (SURGICAL/DEEP WOUND)

## 2018-02-12 MED ORDER — IOHEXOL 300 MG/ML  SOLN
75.0000 mL | Freq: Once | INTRAMUSCULAR | Status: AC | PRN
  Administered 2018-02-12: 75 mL via INTRAVENOUS

## 2018-02-12 MED ORDER — MIDODRINE HCL 5 MG PO TABS
10.0000 mg | ORAL_TABLET | ORAL | Status: DC
  Administered 2018-02-14: 10 mg via ORAL
  Filled 2018-02-12: qty 2

## 2018-02-12 MED ORDER — APIXABAN 2.5 MG PO TABS
2.5000 mg | ORAL_TABLET | Freq: Two times a day (BID) | ORAL | Status: DC
  Administered 2018-02-12 – 2018-02-15 (×8): 2.5 mg via ORAL
  Filled 2018-02-12 (×7): qty 1

## 2018-02-12 NOTE — Progress Notes (Signed)
Patient returned from dialysis.

## 2018-02-12 NOTE — Progress Notes (Signed)
HD tx end    02/12/18 1650  Vital Signs  Pulse Rate 72  Pulse Rate Source Monitor  Resp (!) 21  BP (!) 144/53  BP Location Right Arm  BP Method Automatic  Patient Position (if appropriate) Sitting  Oxygen Therapy  SpO2 99 %  O2 Device Room Air  During Hemodialysis Assessment  Dialysis Fluid Bolus Normal Saline  Bolus Amount (mL) 250 mL  Intra-Hemodialysis Comments Tx completed

## 2018-02-12 NOTE — Progress Notes (Signed)
Palliative:  I met briefly with Stephanie Ellis. She is very pleasant and in good spirits. She confirms that her goals have not changed and she is hoping that her blood pressure will hold for dialysis. She understands that she has challenges and barriers in her health but continues to want full aggressive care. She says that she is feeling stronger and appetite is improving. Motivated to continue with therapy. Emotional support provided.   Exam: Alert, oriented. No distress. Trying to take a nap.   15 min  Vinie Sill, NP Palliative Medicine Team Pager # [phone] (M-F 8a-5p) Team Phone # [phone] (Nights/Weekends)

## 2018-02-12 NOTE — Discharge Instructions (Signed)
·   dry dressing changed 3 x's per week. F/u with podiatry in 2 weeks    Information on my medicine - ELIQUIS (apixaban)  This medication education was reviewed with me or my healthcare representative as part of my discharge preparation.  The pharmacist that spoke with me during my hospital stay was:  Lu Duffel, [facility]  Why was Eliquis prescribed for you? Eliquis was prescribed for you to reduce the risk of a blood clot forming.  What do You need to know about Eliquis ? Take your Eliquis TWICE DAILY - one tablet in the morning and one tablet in the evening with or without food. If you have difficulty swallowing the tablet whole please discuss with your pharmacist how to take the medication safely.  Take Eliquis exactly as prescribed by your doctor and DO NOT stop taking Eliquis without talking to the doctor who prescribed the medication.  Stopping may increase your risk of developing a stroke.  Refill your prescription before you run out.  After discharge, you should have regular check-up appointments with your healthcare provider that is prescribing your Eliquis.  In the future your dose may need to be changed if your kidney function or weight changes by a significant amount or as you get older.  What do you do if you miss a dose? If you miss a dose, take it as soon as you remember on the same day and resume taking twice daily.  Do not take more than one dose of ELIQUIS at the same time to make up a missed dose.  Important Safety Information A possible side effect of Eliquis is bleeding. You should call your healthcare provider right away if you experience any of the following: ? Bleeding from an injury or your nose that does not stop. ? Unusual colored urine (red or dark brown) or unusual colored stools (red or black). ? Unusual bruising for unknown reasons. ? A serious fall or if you hit your head (even if there is no bleeding).  Some medicines may interact with Eliquis  and might increase your risk of bleeding or clotting while on Eliquis. To help avoid this, consult your healthcare provider or pharmacist prior to using any new prescription or non-prescription medications, including herbals, vitamins, non-steroidal anti-inflammatory drugs (NSAIDs) and supplements.  This website has more information on Eliquis (apixaban): http://www.eliquis.com/eliquis/home

## 2018-02-12 NOTE — Progress Notes (Signed)
[facility] [address]                                                                                                                                                                                  Patient Demographics   Stephanie Ellis, is a 67 y.o. female, DOB - [birth year], TJQ:[id number]  Admit date - 01/29/2018   Admitting Physician Amelia Jo, MD  Outpatient Primary MD for the patient is System, Pcp Not In   LOS - 14  Subjective:  The patient has no hemoptysis and epistaxis. Review of Systems:   CONSTITUTIONAL: No documented fever. No fatigue, weakness. No weight gain, no weight loss.  EYES: No blurry or double vision.  ENT: No tinnitus. No postnasal drip. No redness of the oropharynx.  RESPIRATORY: No cough, no wheeze, no hemoptysis. No dyspnea. No hemoptysis and epistaxis. CARDIOVASCULAR: No chest pain. No orthopnea. No palpitations. No syncope.  GASTROINTESTINAL: Had nausea and vomiting, no abdominal pain.   GENITOURINARY: No dysuria or hematuria.  ENDOCRINE: No polyuria or nocturia. No heat or cold intolerance.  HEMATOLOGY: No anemia. No bruising. No bleeding.  INTEGUMENTARY: No rashes. No lesions.  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 NEUROLOGIC: No numbness, tingling, or ataxia. No seizure-type activity.  PSYCHIATRIC: No anxiety. No insomnia. No ADD.    Vitals:   Vitals:   02/11/18 2151 02/12/18 0500 02/12/18 0559 02/12/18 0750  BP: (!) 136/49  (!) 152/59 (!) 138/52  Pulse: 69  81   Resp: 18  20   Temp: 98.2 F (36.8 C)  98.4 F (36.9 C)   TempSrc: Oral  Oral   SpO2: 92%  99%   Weight:  58.4 kg    Height:        Wt Readings from Last 3 Encounters:  02/12/18 58.4 kg  10/15/17 58.1 kg  10/09/17 58.1 kg     Intake/Output Summary (Last 24 hours) at 02/12/2018 1309 Last data filed at 02/12/2018 0900 Gross per 24 hour  Intake 329.93 ml  Output 2 ml  Net 327.93 ml    Physical Exam:   GENERAL:  Pleasant-appearing in no apparent distress.  HEAD, EYES, EARS, NOSE AND THROAT: Atraumatic, normocephalic. Extraocular muscles are intact. Pupils equal and reactive to light. Sclerae anicteric. No conjunctival injection. No oro-pharyngeal erythema.  NECK: Supple. There is no jugular venous distention. No bruits, no lymphadenopathy, no thyromegaly.  HEART: Regular rate and rhythm,. No murmurs, no rubs, no clicks.  LUNGS: Clear to auscultation bilaterally. No rales or rhonchi. No wheezes.  ABDOMEN: Soft, flat, nontender, nondistended. Has good bowel sounds. No hepatosplenomegaly appreciated.  EXTREMITIES: Status left great toe amputation in dressing. NEUROLOGIC: The  patient is alert, awake, and oriented x3 with no focal motor or sensory deficits appreciated bilaterally.  SKIN: Moist and warm with no rashes appreciated.  Psych: Not anxious, depressed LN: No inguinal LN enlargement    Antibiotics   Anti-infectives (From admission, onward)   Start     Dose/Rate Route Frequency Ordered Stop   02/11/18 0921  ceFAZolin (ANCEF) 1-4 GM/50ML-% IVPB    Note to Pharmacy:  Carlynn Spry   : cabinet override      02/11/18 0921 02/11/18 2129   02/11/18 0000  ceFAZolin (ANCEF) IVPB 1 g/50 mL premix    Note to Pharmacy:  Send with pt to OR   1 g 100 mL/hr over 30 Minutes Intravenous On call 02/10/18 2136 02/12/18 0000   02/06/18 0832  ceFAZolin (ANCEF) 1-4 GM/50ML-% IVPB    Note to Pharmacy:  Norton Blizzard  : cabinet override      02/06/18 0832 02/06/18 0855   02/06/18 0831  vancomycin (VANCOCIN) 1-5 GM/200ML-% IVPB  Status:  Discontinued    Note to Pharmacy:  Norton Blizzard  : cabinet override      02/06/18 0831 02/06/18 0846   01/31/18 2200  ceFAZolin (ANCEF) IVPB 1 g/50 mL premix     1 g 100 mL/hr over 30 Minutes Intravenous Every 24 hours 01/31/18 1409     01/30/18 1800  vancomycin (VANCOCIN) IVPB 1000 mg/200 mL premix  Status:  Discontinued     1,000 mg 200 mL/hr over 60 Minutes  Intravenous Every M-W-F (Hemodialysis) 01/30/18 0435 01/30/18 0906   01/30/18 1000  piperacillin-tazobactam (ZOSYN) IVPB 3.375 g  Status:  Discontinued     3.375 g 12.5 mL/hr over 240 Minutes Intravenous Every 12 hours 01/30/18 0435 01/30/18 0906   01/30/18 1000  ceFAZolin (ANCEF) 500 mg in dextrose 5 % 100 mL IVPB  Status:  Discontinued     500 mg 210 mL/hr over 30 Minutes Intravenous Every 12 hours 01/30/18 0906 01/31/18 1409   01/29/18 2200  piperacillin-tazobactam (ZOSYN) IVPB 3.375 g     3.375 g 100 mL/hr over 30 Minutes Intravenous  Once 01/29/18 2146 01/29/18 2242   01/29/18 2200  vancomycin (VANCOCIN) IVPB 1000 mg/200 mL premix     1,000 mg 200 mL/hr over 60 Minutes Intravenous  Once 01/29/18 2146 01/29/18 2314      Medications   Scheduled Meds: . acetaminophen  650 mg Oral Q8H  . apixaban  2.5 mg Oral BID  . aspirin EC  81 mg Oral Daily  . atorvastatin  80 mg Oral QHS  . bisacodyl  10 mg Rectal Daily  . calcitRIOL  0.5 mcg Oral Q breakfast  . carvedilol  3.125 mg Oral BID WC  . Chlorhexidine Gluconate Cloth  6 each Topical Q0600  . cholecalciferol  400 Units Oral Once per day on Mon Wed Fri  . docusate sodium  100 mg Oral BID  . epoetin (EPOGEN/PROCRIT) injection  4,000 Units Intravenous Q M,W,F-HD  . famotidine  20 mg Oral Daily  . feeding supplement (NEPRO CARB STEADY)  237 mL Oral BID BM  .  HYDROmorphone (DILAUDID) injection  0.5 mg Intravenous Once  . insulin aspart  0-5 Units Subcutaneous QHS  . insulin aspart  0-9 Units Subcutaneous TID WC  . isosorbide mononitrate  30 mg Oral Daily  . LORazepam  0.5 mg Intravenous Once  . [START ON 02/14/2018] midodrine  10 mg Oral Once per day on Tue Thu Sat  . mirtazapine  7.5 mg Oral QHS  .  multivitamin  1 tablet Oral QHS  . multivitamin with minerals  1 tablet Oral Daily  . multivitamin-lutein  1 capsule Oral Daily  . sevelamer carbonate  1,600 mg Oral TID WC   Continuous Infusions: .  ceFAZolin (ANCEF) IV Stopped  (02/11/18 2153)   PRN Meds:.acetaminophen **OR** acetaminophen, HYDROmorphone (DILAUDID) injection, metoCLOPramide (REGLAN) injection, nitroGLYCERIN, ondansetron **OR** ondansetron (ZOFRAN) IV, oxyCODONE, traZODone   Data Review:   Micro Results Recent Results (from the past 240 hour(s))  MRSA PCR Screening     Status: None   Collection Time: 02/05/18 11:23 PM  Result Value Ref Range Status   MRSA by PCR NEGATIVE NEGATIVE Final    Comment:        The GeneXpert MRSA Assay (FDA approved for NASAL specimens only), is one component of a comprehensive MRSA colonization surveillance program. It is not intended to diagnose MRSA infection nor to guide or monitor treatment for MRSA infections. Performed at [facility], [address] [place], [place] [address]   Aerobic/Anaerobic Culture (surgical/deep wound)     Status: None   Collection Time: 02/06/18  9:11 AM  Result Value Ref Range Status   Specimen Description TOE GREAT LEFT  Final   Special Requests   Final    NONE Performed at [facility], [address], [place], [place] [address]    Gram Stain NO WBC SEEN NO ORGANISMS SEEN   Final   Culture   Final    RARE STAPHYLOCOCCUS AUREUS NO ANAEROBES ISOLATED Performed at [facility], [place] [address], [place], [place] [address]    Report Status 02/12/2018 FINAL  Final   Organism ID, Bacteria STAPHYLOCOCCUS AUREUS  Final      Susceptibility   Staphylococcus aureus - MIC*    CIPROFLOXACIN <=0.5 SENSITIVE Sensitive     ERYTHROMYCIN <=0.25 SENSITIVE Sensitive     GENTAMICIN <=0.5 SENSITIVE Sensitive     OXACILLIN <=0.25 SENSITIVE Sensitive     TETRACYCLINE <=1 SENSITIVE Sensitive     VANCOMYCIN <=0.5 SENSITIVE Sensitive     TRIMETH/SULFA <=10 SENSITIVE Sensitive     CLINDAMYCIN <=0.25 SENSITIVE Sensitive     RIFAMPIN <=0.5 SENSITIVE Sensitive     Inducible Clindamycin NEGATIVE Sensitive     * RARE STAPHYLOCOCCUS AUREUS    Radiology Reports Dg  Tibia/fibula Left  Result Date: 01/29/2018 CLINICAL DATA:  Presents with ED via ems from home Having pain to both lower legs But states left leg is worse Increased pain with palpation Decreased pulses to foot EXAM: LEFT TIBIA AND FIBULA - 2 VIEW COMPARISON:  None. FINDINGS: No fracture.  No bone lesion. Knee and ankle joints are normally aligned. There are arterial vascular calcifications. Soft tissues are otherwise unremarkable. IMPRESSION: Negative Electronically Signed   By: Lajean Manes M.D.   On: 01/29/2018 20:58   Ct Chest W Contrast  Result Date: 02/12/2018 CLINICAL DATA:  Recent placement of PermCath with an episode of hemoptysis. Patient also presenting with lethargy and signs of sepsis. EXAM: CT CHEST WITH CONTRAST TECHNIQUE: Multidetector CT imaging of the chest was performed during intravenous contrast administration. CONTRAST:  58m OMNIPAQUE IOHEXOL 300 MG/ML  SOLN COMPARISON:  Chest radiograph 02/11/2018 FINDINGS: Cardiovascular: Dual lumen central venous catheter from left IJ approach terminates in the right atrium. There is a small amount of subcutaneous emphysema adjacent to the skin entrance site. No perivascular hematoma is seen along the catheter course. Enlarged cardiac silhouette. Heavy calcific atherosclerotic disease of the coronary arteries and aorta. Post  CABG postsurgical changes. Mediastinum/Nodes: No evidence of lymphadenopathy. 12 mm circumscribed hypoattenuated left thyroid nodule. Lungs/Pleura: No evidence of pneumothorax or pleural effusion. Linear airspace opacities in bilateral lower lungs may represent atelectasis versus scarring. Punctate calcifications are noted along bilateral pleural surfaces. Upper Abdomen: Scattered calcifications throughout the spleen. Punctate vascular calcifications versus small nonobstructive calculi in bilateral kidneys. Musculoskeletal: No chest wall abnormality. No acute or significant osseous findings. IMPRESSION: Dual lumen left IJ  approach central venous catheter terminates in the right atrium. Small amount of subcutaneous emphysema adjacent to the skin entrance site, expected postprocedural finding. No evidence of perivascular hematoma. No evidence of pneumothorax. Bilateral lower lobes pulmonary scarring versus atelectasis. Scattered calcifications along the pleural surfaces in bilateral lower lobes may represent evidence of prior granulomatous disease or asbestos exposure. Heavy calcific atherosclerotic disease of the aorta and coronary arteries. Postsurgical changes of CABG. Cardiomegaly. 12 mm left thyroid nodule. If further imaging is desired, dedicated thyroid ultrasound may be considered. Aortic Atherosclerosis (ICD10-I70.0). Electronically Signed   By: Fidela Salisbury M.D.   On: 02/12/2018 11:13   US Venous Img Lower Unilateral Left  Result Date: 01/29/2018 CLINICAL DATA:  Left leg pain EXAM: LEFT LOWER EXTREMITY VENOUS DOPPLER ULTRASOUND TECHNIQUE: Gray-scale sonography with graded compression, as well as color Doppler and duplex ultrasound were performed to evaluate the lower extremity deep venous systems from the level of the common femoral vein and including the common femoral, femoral, profunda femoral, popliteal and calf veins including the posterior tibial, peroneal and gastrocnemius veins when visible. The superficial great saphenous vein was also interrogated. Spectral Doppler was utilized to evaluate flow at rest and with distal augmentation maneuvers in the common femoral, femoral and popliteal veins. COMPARISON:  None. FINDINGS: Contralateral Common Femoral Vein: Respiratory phasicity is normal and symmetric with the symptomatic side. No evidence of thrombus. Normal compressibility. Common Femoral Vein: No evidence of thrombus. Normal compressibility, respiratory phasicity and response to augmentation. Saphenofemoral Junction: No evidence of thrombus. Normal compressibility and flow on color Doppler imaging.  Profunda Femoral Vein: No evidence of thrombus. Normal compressibility and flow on color Doppler imaging. Femoral Vein: No evidence of thrombus. Normal compressibility, respiratory phasicity and response to augmentation. Popliteal Vein: No evidence of thrombus. Normal compressibility, respiratory phasicity and response to augmentation. Calf Veins: No evidence of thrombus. Normal compressibility and flow on color Doppler imaging. Superficial Great Saphenous Vein: No evidence of thrombus. Normal compressibility. Venous Reflux:  None. Other Findings:  None. IMPRESSION: No evidence of deep venous thrombosis. Electronically Signed   By: Rolm Baptise M.D.   On: 01/29/2018 18:36   Dg Chest Port 1 View  Result Date: 02/11/2018 CLINICAL DATA:  Hemoptysis EXAM: PORTABLE CHEST 1 VIEW COMPARISON:  10/15/2017 FINDINGS: Left IJ dialysis catheter tips proximal mid right atrium. Stable cardiomegaly without CHF. Chronic left basilar scarring as before with blunting of left costophrenic angle. No definite superimposed acute process or CHF. No pneumothorax. Trachea is midline. Previous median sternotomy noted. Aorta atherosclerotic. Degenerative changes noted of the spine. IMPRESSION: Stable cardiomegaly and left basilar scarring. No interval change or superimposed acute process. Electronically Signed   By: Jerilynn Mages.  Shick M.D.   On: 02/11/2018 17:57   Dg Toe Great Left  Result Date: 01/29/2018 CLINICAL DATA:  Open wound to the left great toe for several months. EXAM: LEFT GREAT TOE COMPARISON:  10/07/2017 FINDINGS: Degenerative changes in the first metatarsal-phalangeal joint. Soft tissue defect over the plantar aspect of the left first toe. There is underlying erosion of the  distal phalangeal tuft cortex suggesting focal osteomyelitis. No evidence of acute fracture or dislocation. Diffuse bone demineralization. Prominent vascular calcifications. IMPRESSION: Soft tissue defect over the plantar aspect of the left first toe with  underlying erosion of the distal phalangeal tuft suggesting osteomyelitis. Electronically Signed   By: Lucienne Capers M.D.   On: 01/29/2018 21:30     CBC Recent Labs  Lab 02/09/18 0803 02/10/18 0308 02/11/18 0350 02/11/18 1321 02/12/18 0801  WBC 23.8* 23.6* 20.3* 20.6* 17.2*  HGB 8.1* 7.9* 7.5* 7.2* 7.6*  HCT 27.1* 26.1* 26.0* 24.7* 26.4*  PLT 249 276 284 295 307  MCV 101.5* 101.2* 103.2* 103.3* 104.3*  MCH 30.3 30.6 29.8 30.1 30.0  MCHC 29.9* 30.3 28.8* 29.1* 28.8*  RDW 21.4* 21.8* 21.9* 22.2* 22.3*    Chemistries  Recent Labs  Lab 02/06/18 0511 02/07/18 0545 02/08/18 0439 02/11/18 0350 02/12/18 0801  NA  --  135 139 134* 137  K 5.3* 5.5* 4.3 5.3* 4.6  CL  --  90* 96* 92* 92*  CO2  --  26 32 25 29  GLUCOSE  --  187* 244* 208* 155*  BUN  --  62* 38* 75* 39*  CREATININE  --  9.29* 5.74* 9.55* 6.19*  CALCIUM  --  8.6* 7.9* 8.0* 7.9*   ------------------------------------------------------------------------------------------------------------------ estimated creatinine clearance is 7.7 mL/min (A) (by C-G formula based on SCr of 6.19 mg/dL (H)). ------------------------------------------------------------------------------------------------------------------ No results for input(s): HGBA1C in the last 72 hours. ------------------------------------------------------------------------------------------------------------------ No results for input(s): CHOL, HDL, LDLCALC, TRIG, CHOLHDL, LDLDIRECT in the last 72 hours. ------------------------------------------------------------------------------------------------------------------ No results for input(s): TSH, T4TOTAL, T3FREE, THYROIDAB in the last 72 hours.  Invalid input(s): FREET3 ------------------------------------------------------------------------------------------------------------------ No results for input(s): VITAMINB12, FOLATE, FERRITIN, TIBC, IRON, RETICCTPCT in the last 72 hours.  Coagulation profile No  results for input(s): INR, PROTIME in the last 168 hours.  No results for input(s): DDIMER in the last 72 hours.  Cardiac Enzymes No results for input(s): CKMB, TROPONINI, MYOGLOBIN in the last 168 hours.  Invalid input(s): CK ------------------------------------------------------------------------------------------------------------------ Invalid input(s): [place]  Patient admitted with sepsis   1.  Sepsis,  with MSSA with left great toe osteomyelitis, Tricuspid endocarditis.   Status post removal of her permacath by vascular and temporary dialysis catheter. Repeat blood cultures from 10/11 has been negative so far.  Better lleukocytosis. Per Dr. Steva Ready, will need 6 weeks of IV Cefazolin which can be given during dialysis so she does not need another catheter  2 grams, 2grams and 3 grams until 03/13/18 with weekly CBC/CMP.  Acute ischemia of the left foot status post angioplasty of left popliteal by vascular , heparin drip was discontinued due to epistaxis.  Resumed Heparin drip.  But the patient had hemoptysis and epistaxis.  Discontinued heparin drip.  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 Dressing changed 3 x's per week. F/u with podiatry in 2 weeks per Dr. Vickki Muff.  2.    Acute encephalopathy due to #1 now improved.   3.  PAD.,  Left leg ischemia, status post hospitalization, continue aspirin and statins. Heparin drip is restarted, which is discontinued due to hemoptysis and epistaxis.   May start low dose Eliquis after permacath per Dr. Lucky Cowboy. Start Eliquis.   4.  End-stage renal disease on peritoneal dialysis.     Patient does not want peritoneal dialysis anymore. S/P right temp HD catheter. S/P permacath placement and continue hemodialysis today per Dr. Holley Raring.   5.  Diabetes type  2.  controlled, continue insulin with sliding scale coverage. Continue carb controlled renal diet.  6.  Chronic diastolic CHF, LV EF: 79% -    55%,  currently clinically compensated, continue maintenance therapy.  Continue Coreg, Imdur, midodrine.  7.  Peripheral neuropathy, continue pain medication.  #8. nausea likely secondary to sepsis/narcotic induced: And possible diabetic gastroparesis: continue Reglan prn, PPI, improved.  #.9. Hyperkalemia.  Discontinued ibesartan, improved with hemodialysis. But K5.3 today.  Repeat BMP after hemodialysis.  Hemoptysis and epistaxis.  Discontinued heparin drip, chest x-ray and CT are unremarkable.  Improved.  Anemia of chronic disease.  Hemoglobin decreased to 7.2.  Follow-up hemoglobin 7.6, on Epo during dialysis PRBC transfusion PRN.  I discussed with Dr. Holley Raring PT suggest SNF.    Code Status Orders  (From admission, onward)         Start     Ordered   01/29/18 2250  Full code  Continuous     01/29/18 2250        Code Status History    Date Active Date Inactive Code Status Order ID Comments User Context   10/24/2016 1055 10/25/2016 2243 Full Code [id number]  Hillary Bow, MD ED   10/17/2016 2143 10/19/2016 1440 Full Code [id number]  Vaughan Basta, MD Inpatient   09/04/2016 1708 09/05/2016 1500 Full Code [id number]  Epifanio Lesches, MD ED   08/22/2016 0938 08/22/2016 1833 Full Code [id number]  Algernon Huxley, MD Inpatient   06/27/2016 1121 06/27/2016 1846 Full Code [id number]  Algernon Huxley, MD Inpatient   10/03/2015 2235 10/04/2015 1724 Full Code [id number]  Harrie Foreman, MD Inpatient           Consults  NEPHROLOGY AND VASCULAR SURGERY   HEPRIN  Lab Results  Component Value Date   PLT 307 02/12/2018   High risk for cardiac arrest secondary to multiple medical problems. Time Spent in minutes 27 MIN   Greater than 50% of time spent in care coordination and counseling patient regarding the condition and plan of care.  Demetrios Loll M.D on 02/12/2018 at 1:09 PM  Between 7am to 6pm - Pager - [phone]  After 6pm go to [email or web address] - Proofreader  [employer]   Office  [phone]

## 2018-02-12 NOTE — Progress Notes (Signed)
Post HD assessment    02/12/18 1656  Neurological  Level of Consciousness Alert  Orientation Level Oriented X4  Respiratory  Respiratory Pattern Regular;Unlabored  Chest Assessment Chest expansion symmetrical  Cough Productive  Cardiac  ECG Monitor Yes  Cardiac Rhythm NSR  Vascular  R Radial Pulse +2  L Radial Pulse +2  Edema Left lower extremity  Integumentary  Integumentary (WDL) X  Skin Color Appropriate for ethnicity  Musculoskeletal  Musculoskeletal (WDL) X  Generalized Weakness Yes  Assistive Device None  GU Assessment  Genitourinary (WDL) X  Genitourinary Symptoms  (HD)  Psychosocial  Psychosocial (WDL) WDL

## 2018-02-12 NOTE — Progress Notes (Signed)
Post HD assessment. Pt tolerated tx weel without c/o or complication. Net UF 68m, goal met.    02/12/18 1658  Vital Signs  Temp 97.8 F (36.6 C)  Temp Source Oral  Pulse Rate 76  Pulse Rate Source Monitor  Resp (!) 21  BP (!) 148/52  BP Location Right Arm  BP Method Automatic  Patient Position (if appropriate) Lying  Oxygen Therapy  SpO2 98 %  O2 Device Room Air  Dialysis Weight  Weight 58.4 kg  Type of Weight Post-Dialysis  Post-Hemodialysis Assessment  Rinseback Volume (mL) 250 mL  KECN 43.6 V  Dialyzer Clearance Lightly streaked  Duration of HD Treatment -hour(s) 2.5 hour(s)  Hemodialysis Intake (mL) 500 mL  UF Total -Machine (mL) 502 mL  Net UF (mL) 2 mL  Tolerated HD Treatment Yes  Education / Care Plan  Dialysis Education Provided Yes  Documented Education in Care Plan Yes  Hemodialysis Catheter Left Subclavian Double-lumen  Placement Date: 02/11/18   Placed prior to admission: No  Orientation: Left  Access Location: Subclavian  Hemodialysis Catheter Type: Double-lumen  Site Condition No complications  Blue Lumen Status Heparin locked  Red Lumen Status Heparin locked  Purple Lumen Status N/A  Catheter fill solution Heparin 1000 units/ml  Catheter fill volume (Arterial) 1.7 cc  Catheter fill volume (Venous) 1.7  Dressing Type Biopatch  Dressing Status Clean;Dry;Intact  Interventions Dressing changed  Drainage Description None  Dressing Change Due 02/19/18  Post treatment catheter status Capped and Clamped

## 2018-02-12 NOTE — Progress Notes (Signed)
Pre HD assessment   02/12/18 1411  Vital Signs  Temp 98.1 F (36.7 C)  Temp Source Oral  Pulse Rate 73  Pulse Rate Source Monitor  Resp (!) 26  BP (!) 146/54  BP Location Right Arm  BP Method Automatic  Patient Position (if appropriate) Sitting  Oxygen Therapy  SpO2 98 %  O2 Device Room Air  Pain Assessment  Pain Scale 0-10  Pain Score 0  Dialysis Weight  Weight 58.4 kg  Type of Weight Pre-Dialysis  Time-Out for Hemodialysis  What Procedure? HD  Pt Identifiers(min of two) First/Last Name;MRN/Account#  Correct Site? Yes  Correct Side? Yes  Correct Procedure? Yes  Consents Verified? Yes  Rad Studies Available? N/A  Safety Precautions Reviewed? Yes  Engineer, civil (consulting) Number  ([id number])  Station Number 4  UF/Alarm Test Passed  Conductivity: Meter 13.6  Conductivity: Machine  13.6  pH 7.6  Reverse Osmosis main  Normal Saline Lot Number [id number]  Dialyzer Lot Number [id number]  Disposable Set Lot Number [id number]  Machine Temperature 98.6 F (37 C)  Musician and Audible Yes  Blood Lines Intact and Secured Yes  Pre Treatment Patient Checks  Vascular access used during treatment Catheter  Patient is receiving dialysis in a chair Yes  Hepatitis B Surface Antigen Results Negative  Date Hepatitis B Surface Antigen Drawn 11/26/17  Hepatitis B Surface Antibody  (>10)  Date Hepatitis B Surface Antibody Drawn 11/26/17  Hemodialysis Consent Verified Yes  Hemodialysis Standing Orders Initiated Yes  ECG (Telemetry) Monitor On Yes  Prime Ordered Normal Saline  Length of  DialysisTreatment -hour(s) 2.5 Hour(s)  Dialyzer Elisio 17H NR  Dialysate 3K, 2.5 Ca  Dialysis Anticoagulant None  Dialysate Flow Ordered 500  Blood Flow Rate Ordered 300 mL/min  Ultrafiltration Goal 0 Liters  Dialysis Blood Pressure Support Ordered Normal Saline  Education / Care Plan  Dialysis Education Provided Yes  Documented Education in Care Plan Yes  Hemodialysis Catheter Left Subclavian  Double-lumen  Placement Date: 02/11/18   Placed prior to admission: No  Orientation: Left  Access Location: Subclavian  Hemodialysis Catheter Type: Double-lumen  Site Condition No complications  Blue Lumen Status Heparin locked  Red Lumen Status Heparin locked  Purple Lumen Status N/A  Dressing Type Gauze/Drain sponge  Dressing Status Clean;Dry;Intact  Drainage Description None

## 2018-02-12 NOTE — Progress Notes (Signed)
Physical Therapy Treatment Patient Details Name: Stephanie Ellis MRN: [id number] DOB: [birth year] Today's Date: 02/12/2018    History of Present Illness Patient is a 67 year old female admitted with sepsis and receiving a catheterization of L LE on 01/30/18.  Dialysis permcath removed 02/04/18.  R fem temporary HD cath placed 02/06/18.  Pt s/p L great toe amp (MTPJ) 10/18.  PMH includes peritoneal dialysis, DMII, CABG x3, CAD, MI, ESRD, CAD, CHF, anorexia and anemia.    PT Comments    Temp cath removed.   Discussed with RN and ok for session.  To edge of bed with rail and no assist.  Once sitting, able to sit unsupported without LOB.  Attempted to stand x 2 with +1 assist with post op boot L foot.  She was unable to stand fully and used bed for support.  Returned to sitting.  Participated in exercises as described below.  A second assist was used and she was able to stand fully with min a x 1 to walker.  She was able to turn to recliner with slow small steps.  Discussed mobility status with RN.   Follow Up Recommendations  SNF     Equipment Recommendations  Rolling walker with 5" wheels    Recommendations for Other Services       Precautions / Restrictions Precautions Precautions: Fall Precaution Comments: Temp cath removed, now permacath Restrictions Weight Bearing Restrictions: Yes LLE Weight Bearing: Non weight bearing Other Position/Activity Restrictions: Heel WB'ing L foot for transfers only with post op shoe    Mobility  Bed Mobility Overal bed mobility: Modified Independent Bed Mobility: Supine to Sit;Sit to Supine              Transfers Overall transfer level: Needs assistance Equipment used: Rolling walker (2 wheeled) Transfers: Sit to/from Stand Sit to Stand: Min assist;+2 physical assistance            Ambulation/Gait Ambulation/Gait assistance: Min assist;+2 physical assistance Gait Distance (Feet): 2 Feet Assistive device: Rolling walker (2  wheeled)       General Gait Details: transfer to chair only - orders limtied to transfers only due to L foot wound   Stairs             Wheelchair Mobility    Modified Rankin (Stroke Patients Only)       Balance Overall balance assessment: Needs assistance Sitting-balance support: Bilateral upper extremity supported Sitting balance-Leahy Scale: Fair     Standing balance support: Bilateral upper extremity supported Standing balance-Leahy Scale: Poor                              Cognition Arousal/Alertness: Awake/alert Behavior During Therapy: WFL for tasks assessed/performed Overall Cognitive Status: Within Functional Limits for tasks assessed                                        Exercises Other Exercises Other Exercises: AROM 2 x 10 for ankle pumps, LAQ, marches with no LOB in sitting    General Comments        Pertinent Vitals/Pain Pain Assessment: No/denies pain    Home Living                      Prior Function            PT Goals (  current goals can now be found in the care plan section) Progress towards PT goals: Progressing toward goals    Frequency    Min 2X/week      PT Plan Current plan remains appropriate    Co-evaluation              AM-PAC PT "6 Clicks" Daily Activity  Outcome Measure  Difficulty turning over in bed (including adjusting bedclothes, sheets and blankets)?: None Difficulty moving from lying on back to sitting on the side of the bed? : None Difficulty sitting down on and standing up from a chair with arms (e.g., wheelchair, bedside commode, etc,.)?: Unable Help needed moving to and from a bed to chair (including a wheelchair)?: A Lot Help needed walking in hospital room?: Total Help needed climbing 3-5 steps with a railing? : Total 6 Click Score: 13    End of Session Equipment Utilized During Treatment: Gait belt Activity Tolerance: Patient tolerated treatment  well Patient left: in chair;with call bell/phone within reach;with chair alarm set;with nursing/sitter in room Nurse Communication: Mobility status Pain - Right/Left: Right     Time: 0016-4290 PT Time Calculation (min) (ACUTE ONLY): 24 min  Charges:  $Therapeutic Exercise: 8-22 mins $Therapeutic Activity: 8-22 mins                     [name], PTA 02/12/18, 12:03 PM

## 2018-02-12 NOTE — Progress Notes (Signed)
Stephanie Ellis is a 67 y.o. female with a history of end-stage renal disease, diabetes mellitus coronary artery disease status post CABG, peripheral neuropathy was brought to the ED from home on 01/29/2018 with worsening pain left leg. As per patient she had noted that there was discoloration of the left foot 2 to 3 days prior to the presentation. She then felt severe pain and was unable to ambulate. As per patient she has had callus and ulcer on the left foot on and off for almost a year. She is being followed [facility] who saw her couple of months ago. When she came to the ED her temperature was as high as 102 blood pressure was 153/68. She was noted to have a gangrenous left great toe. Blood cultures were sent she was initially started on vancomycin and Zosyn.  She was seen by podiatrist and as she had an ischemic left leg she was seen by vascular surgeon and underwent percutaneous transluminal angioplasty of the left popliteal artery and mechanical aspiration of the left popliteal artery and left anterior tibial artery and left tibioperoneal trunk and peroneal artery.  She was started on heparin. The blood culture came back positive for staph aureus bacteremia and mandatory ID consult was obtained and the Vanco and Zosyn was changed to cefazolin.  Patient states she has been on dialysis for the past 2 years. Initially she had a left AV fistula which did not work. Then she had a tunnel catheter placed on the right side of her chest. She wanted freedom and hands for the past 2 months she was been getting peritoneal dialysis. But as she has been very tired before her admission she missed 2 days of peritoneal dialysis. She also had some cramping of the abdomen. She has now decided that she will not get peritoneal dialysis anymore.  Subjective  Doing better today NO epistaxis today  Objective:   Vitals:   02/12/18 1658 02/12/18 1700  BP: (!) 148/52   Pulse: 76 74  Resp: (!) 21 17  Temp: 97.8  F (36.6 C)   SpO2: 98% 97%   PHYSICAL EXAM:  General: awake and alert and having dinner Lungs: b/l air entry New permacath left chest wall  Heart: X9-J4, 2 / 6 systolic murmur  Sternal scar  Abdomen: Soft, non-tender,not distended. Bowel sounds normal. No masses,Peritoneal dialysis catheter still  present  Extremities: left leg surgical dressing not removed   skin: No rashes or lesions. Or bruising  Lymph: Cervical, supraclavicular normal.  Neurologic: Grossly non-focal  Pertinent Labs  CBC Latest Ref Rng & Units 02/12/2018 02/11/2018 02/11/2018  WBC 4.0 - 10.5 K/uL 17.2(H) 20.6(H) 20.3(H)  Hemoglobin 12.0 - 15.0 g/dL 7.6(L) 7.2(L) 7.5(L)  Hematocrit 36.0 - 46.0 % 26.4(L) 24.7(L) 26.0(L)  Platelets 150 - 400 K/uL 307 295 284    BC 10/10 2/2 MSSA  10/11 NG  10/18 WC MSSA IMAGING RESULTS:  X-ray of the left foot shows erosion of the distal phalanx of the great toe  ?  Impression/Recommendation  67 y.o. female with a history of end-stage renal disease, diabetes mellitus coronary artery disease status post CABG, peripheral neuropathy was brought to the ED from home on 01/29/2018 with worsening pain left leg. As per patient she had noted that there was discoloration of the left foot 2 to 3 days prior to the presentation. She then felt severe pain and was unable to ambulate. As per patient she has had callus and ulcer on the left foot on  and off for almost a year.?  ?  ?Left great toe gangrene with the areas of ischemic islands on the foot. 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 Has tricuspid endocarditis The hemodialysis catheter has been removed . Will need 6 weeks of IV Cefazolin which can be given during dialysis so she does not need another catheter  2 grams on Tue, 2grams On Thu and 3 grams on sat  If those are the days of dialysis  until 03/13/18 with weekly CBC/CMP   Coronary artery disease status post CABG    Diabetes mellitus on insulin  Discussed the  management with patient .

## 2018-02-12 NOTE — Progress Notes (Signed)
HD tx start    02/12/18 1417  Vital Signs  Pulse Rate 73  Pulse Rate Source Monitor  Resp 12  BP (!) 143/49  BP Location Right Arm  BP Method Automatic  Patient Position (if appropriate) Sitting  Oxygen Therapy  SpO2 96 %  O2 Device Room Air  During Hemodialysis Assessment  Blood Flow Rate (mL/min) 300 mL/min  Arterial Pressure (mmHg) -130 mmHg  Venous Pressure (mmHg) 100 mmHg  Transmembrane Pressure (mmHg) 50 mmHg  Ultrafiltration Rate (mL/min) 200 mL/min  Dialysate Flow Rate (mL/min) 500 ml/min  Conductivity: Machine  13.6  HD Safety Checks Performed Yes  Dialysis Fluid Bolus Normal Saline  Bolus Amount (mL) 250 mL  Intra-Hemodialysis Comments Tx initiated  Hemodialysis Catheter Left Subclavian Double-lumen  Placement Date: 02/11/18   Placed prior to admission: No  Orientation: Left  Access Location: Subclavian  Hemodialysis Catheter Type: Double-lumen  Blue Lumen Status Infusing  Red Lumen Status Infusing

## 2018-02-12 NOTE — Progress Notes (Signed)
[facility]  ROUNDING NOTE   Subjective:  Patient seen at bedside. Had some hemoptysis yesterday. Pulmonology consulted. Due for dialysis today.   Objective:  Vital signs in last 24 hours:  Temp:  [98 F (36.7 C)-98.5 F (36.9 C)] 98.1 F (36.7 C) (10/24 1411) Pulse Rate:  [65-81] 65 (10/24 1615) Resp:  [9-26] 11 (10/24 1615) BP: (118-156)/(44-61) 128/57 (10/24 1615) SpO2:  [92 %-100 %] 99 % (10/24 1615) Weight:  [58.4 kg] 58.4 kg (10/24 1411)  Weight change: 0.9 kg Filed Weights   02/12/18 0500 02/12/18 1411  Weight: 58.4 kg 58.4 kg    Intake/Output: I/O last 3 completed shifts: In: 89.9 [IV Piggyback:89.9] Out: 2 [Other:2]   Intake/Output this shift:  Total I/O In: 240 [P.O.:240] Out: -   Physical Exam: General: NAD,   Head: Normocephalic, atraumatic. Moist oral mucosal membranes  Eyes: Anicteric  Neck: Supple, trachea midline  Lungs:  Clear to auscultation  Heart: Regular rate and rhythm  Abdomen:  Soft, nontender  Extremities: no peripheral edema.  Neurologic: Nonfocal, moving all four extremities  Skin: No lesions  Access: PD catheter, left IJ tunneled catheter, right femoral temp catheter .     Basic Metabolic Panel: Recent Labs  Lab 02/06/18 0511  02/07/18 0545 02/08/18 0439 02/11/18 0350 02/12/18 0801  NA  --   --  135 139 134* 137  K 5.3*  --  5.5* 4.3 5.3* 4.6  CL  --   --  90* 96* 92* 92*  CO2  --   --  26 32 25 29  GLUCOSE  --   --  187* 244* 208* 155*  BUN  --   --  62* 38* 75* 39*  CREATININE  --   --  9.29* 5.74* 9.55* 6.19*  CALCIUM  --    < > 8.6* 7.9* 8.0* 7.9*   < > = values in this interval not displayed.    Liver Function Tests: No results for input(s): AST, ALT, ALKPHOS, BILITOT, PROT, ALBUMIN in the last 168 hours. No results for input(s): LIPASE, AMYLASE in the last 168 hours. No results for input(s): AMMONIA in the last 168 hours.  CBC: Recent Labs  Lab 02/09/18 0803 02/10/18 0308 02/11/18 0350  02/11/18 1321 02/12/18 0801  WBC 23.8* 23.6* 20.3* 20.6* 17.2*  HGB 8.1* 7.9* 7.5* 7.2* 7.6*  HCT 27.1* 26.1* 26.0* 24.7* 26.4*  MCV 101.5* 101.2* 103.2* 103.3* 104.3*  PLT 249 276 284 295 307    Cardiac Enzymes: No results for input(s): CKTOTAL, CKMB, CKMBINDEX, TROPONINI in the last 168 hours.  BNP: Invalid input(s): POCBNP  CBG: Recent Labs  Lab 02/11/18 1220 02/11/18 1711 02/11/18 2114 02/12/18 0727 02/12/18 1119  GLUCAP 118* 90 225* 152* 105*    Microbiology: Results for orders placed or performed during the hospital encounter of 01/29/18  Blood Culture (routine x 2)     Status: Abnormal   Collection Time: 01/29/18  9:55 PM  Result Value Ref Range Status   Specimen Description   Final    BLOOD RIGHT ANTECUBITAL Performed at [facility], [address] [place], [place] [address]    Special Requests   Final    BOTTLES DRAWN AEROBIC AND ANAEROBIC Blood Culture adequate volume Performed at [facility], [address] [place], [place] [address]    Culture  Setup Time   Final    GRAM POSITIVE COCCI IN CLUSTERS IN BOTH AEROBIC AND ANAEROBIC BOTTLES CRITICAL RESULT CALLED TO, READ BACK BY  AND VERIFIED WITH: Charlane Ferretti 01/30/18 0839 JGF Performed at [facility], [address] [place], [place] [address]    Culture (A)  Final    STAPHYLOCOCCUS AUREUS SUSCEPTIBILITIES PERFORMED ON PREVIOUS CULTURE WITHIN THE LAST 5 DAYS. Performed at [facility], [place] [address] [place], [place] [address]    Report Status 02/01/2018 FINAL  Final  Blood Culture (routine x 2)     Status: Abnormal   Collection Time: 01/29/18  9:55 PM  Result Value Ref Range Status   Specimen Description   Final    BLOOD RIGHT ANTECUBITAL Performed at [facility], [address], [place], [place] [address]    Special Requests   Final    BOTTLES DRAWN AEROBIC AND ANAEROBIC Blood Culture adequate volume Performed at [facility], [address] [place], [place] [address]    Culture  Setup Time   Final    GRAM POSITIVE COCCI IN CLUSTERS IN BOTH AEROBIC AND ANAEROBIC BOTTLES CRITICAL RESULT CALLED TO, READ BACK BY AND VERIFIED WITH: KAREN HAYES 01/30/18 0839 JGF Performed at [facility], [address] [place], [place] [address]    Culture STAPHYLOCOCCUS AUREUS (A)  Final   Report Status 02/01/2018 FINAL  Final   Organism ID, Bacteria STAPHYLOCOCCUS AUREUS  Final      Susceptibility   Staphylococcus aureus - MIC*    CIPROFLOXACIN <=0.5 SENSITIVE Sensitive     ERYTHROMYCIN <=0.25 SENSITIVE Sensitive     GENTAMICIN <=0.5 SENSITIVE Sensitive     OXACILLIN 0.5 SENSITIVE Sensitive     TETRACYCLINE <=1 SENSITIVE Sensitive     VANCOMYCIN <=0.5 SENSITIVE Sensitive     TRIMETH/SULFA <=10 SENSITIVE Sensitive     CLINDAMYCIN <=0.25 SENSITIVE Sensitive     RIFAMPIN <=0.5 SENSITIVE Sensitive     Inducible Clindamycin NEGATIVE Sensitive     * STAPHYLOCOCCUS AUREUS  Blood Culture ID Panel (Reflexed)     Status: Abnormal   Collection Time: 01/29/18  9:55 PM  Result Value Ref Range Status   Enterococcus species NOT DETECTED NOT DETECTED Final   Vancomycin resistance NOT DETECTED NOT DETECTED Final   Listeria monocytogenes NOT DETECTED NOT DETECTED Final   Staphylococcus species DETECTED (A) NOT DETECTED Final    Comment: CRITICAL RESULT CALLED TO, READ BACK BY AND VERIFIED WITH: KAREN HAYES 01/30/18 0839 JGF    Staphylococcus aureus (BCID) DETECTED (A) NOT DETECTED Final    Comment: CRITICAL RESULT CALLED TO, READ BACK BY AND VERIFIED WITH: KAREN HAYES 01/30/18 0839 JGF Methicillin (oxacillin) susceptible Staphylococcus aureus (MSSA). Preferred therapy is anti staphylococcal beta lactam antibiotic (Cefazolin or Nafcillin), unless clinically contraindicated.    Methicillin resistance NOT DETECTED NOT DETECTED Final   Streptococcus species NOT DETECTED NOT DETECTED Final   Streptococcus agalactiae NOT DETECTED NOT  DETECTED Final   Streptococcus pneumoniae NOT DETECTED NOT DETECTED Final   Streptococcus pyogenes NOT DETECTED NOT DETECTED Final   Acinetobacter baumannii NOT DETECTED NOT DETECTED Final   Enterobacteriaceae species NOT DETECTED NOT DETECTED Final   Enterobacter cloacae complex NOT DETECTED NOT DETECTED Final   Escherichia coli NOT DETECTED NOT DETECTED Final   Klebsiella oxytoca NOT DETECTED NOT DETECTED Final   Klebsiella pneumoniae NOT DETECTED NOT DETECTED Final   Proteus species NOT DETECTED NOT DETECTED Final   Serratia marcescens NOT DETECTED NOT DETECTED Final   Carbapenem resistance NOT DETECTED NOT DETECTED Final   Haemophilus influenzae NOT DETECTED NOT DETECTED Final   Neisseria meningitidis NOT DETECTED NOT  DETECTED Final   Pseudomonas aeruginosa NOT DETECTED NOT DETECTED Final   Candida albicans NOT DETECTED NOT DETECTED Final   Candida glabrata NOT DETECTED NOT DETECTED Final   Candida krusei NOT DETECTED NOT DETECTED Final   Candida parapsilosis NOT DETECTED NOT DETECTED Final   Candida tropicalis NOT DETECTED NOT DETECTED Final    Comment: Performed at [facility], [address] [place], [place] [address]  MRSA PCR Screening     Status: None   Collection Time: 01/29/18 10:55 PM  Result Value Ref Range Status   MRSA by PCR NEGATIVE NEGATIVE Final    Comment:        The GeneXpert MRSA Assay (FDA approved for NASAL specimens only), is one component of a comprehensive MRSA colonization surveillance program. It is not intended to diagnose MRSA infection nor to guide or monitor treatment for MRSA infections. Performed at [facility], [address] [place], [place] [address]   Culture, blood (Routine X 2) w Reflex to ID Panel     Status: None   Collection Time: 01/30/18  9:27 AM  Result Value Ref Range Status   Specimen Description BLOOD RIGHT ANTECUBITAL  Final   Special Requests   Final    BOTTLES DRAWN AEROBIC AND ANAEROBIC Blood  Culture adequate volume   Culture   Final    NO GROWTH 5 DAYS Performed at [facility], [address] [place], [place] [address]    Report Status 02/04/2018 FINAL  Final  MRSA PCR Screening     Status: None   Collection Time: 02/05/18 11:23 PM  Result Value Ref Range Status   MRSA by PCR NEGATIVE NEGATIVE Final    Comment:        The GeneXpert MRSA Assay (FDA approved for NASAL specimens only), is one component of a comprehensive MRSA colonization surveillance program. It is not intended to diagnose MRSA infection nor to guide or monitor treatment for MRSA infections. Performed at [facility], [address], [place], [place] [address]   Aerobic/Anaerobic Culture (surgical/deep wound)     Status: None   Collection Time: 02/06/18  9:11 AM  Result Value Ref Range Status   Specimen Description TOE GREAT LEFT  Final   Special Requests   Final    NONE Performed at [facility], [address], [place], [place] [address]    Gram Stain NO WBC SEEN NO ORGANISMS SEEN   Final   Culture   Final    RARE STAPHYLOCOCCUS AUREUS NO ANAEROBES ISOLATED Performed at [facility], [place] [address], [place], [place] [address]    Report Status 02/12/2018 FINAL  Final   Organism ID, Bacteria STAPHYLOCOCCUS AUREUS  Final      Susceptibility   Staphylococcus aureus - MIC*    CIPROFLOXACIN <=0.5 SENSITIVE Sensitive     ERYTHROMYCIN <=0.25 SENSITIVE Sensitive     GENTAMICIN <=0.5 SENSITIVE Sensitive     OXACILLIN <=0.25 SENSITIVE Sensitive     TETRACYCLINE <=1 SENSITIVE Sensitive     VANCOMYCIN <=0.5 SENSITIVE Sensitive     TRIMETH/SULFA <=10 SENSITIVE Sensitive     CLINDAMYCIN <=0.25 SENSITIVE Sensitive     RIFAMPIN <=0.5 SENSITIVE Sensitive     Inducible Clindamycin NEGATIVE Sensitive     * RARE STAPHYLOCOCCUS AUREUS    Coagulation Studies: No results for input(s): LABPROT, INR in the last 72 hours.  Urinalysis: No results for input(s):  COLORURINE, LABSPEC, [place], [place], [place], [place], [place], [place], [place], NITRITE, LEUKOCYTESUR  in the last 72 hours.  Invalid input(s): APPERANCEUR    Imaging: Ct Chest W Contrast  Result Date: 02/12/2018 CLINICAL DATA:  Recent placement of PermCath with an episode of hemoptysis. Patient also presenting with lethargy and signs of sepsis. EXAM: CT CHEST WITH CONTRAST TECHNIQUE: Multidetector CT imaging of the chest was performed during intravenous contrast administration. CONTRAST:  16m OMNIPAQUE IOHEXOL 300 MG/ML  SOLN COMPARISON:  Chest radiograph 02/11/2018 FINDINGS: Cardiovascular: Dual lumen central venous catheter from left IJ approach terminates in the right atrium. There is a small amount of subcutaneous emphysema adjacent to the skin entrance site. No perivascular hematoma is seen along the catheter course. Enlarged cardiac silhouette. Heavy calcific atherosclerotic disease of the coronary arteries and aorta. Post CABG postsurgical changes. Mediastinum/Nodes: No evidence of lymphadenopathy. 12 mm circumscribed hypoattenuated left thyroid nodule. Lungs/Pleura: No evidence of pneumothorax or pleural effusion. Linear airspace opacities in bilateral lower lungs may represent atelectasis versus scarring. Punctate calcifications are noted along bilateral pleural surfaces. Upper Abdomen: Scattered calcifications throughout the spleen. Punctate vascular calcifications versus small nonobstructive calculi in bilateral kidneys. Musculoskeletal: No chest wall abnormality. No acute or significant osseous findings. IMPRESSION: Dual lumen left IJ approach central venous catheter terminates in the right atrium. Small amount of subcutaneous emphysema adjacent to the skin entrance site, expected postprocedural finding. No evidence of perivascular hematoma. No evidence of pneumothorax. Bilateral lower lobes pulmonary scarring versus atelectasis. Scattered calcifications along the pleural  surfaces in bilateral lower lobes may represent evidence of prior granulomatous disease or asbestos exposure. Heavy calcific atherosclerotic disease of the aorta and coronary arteries. Postsurgical changes of CABG. Cardiomegaly. 12 mm left thyroid nodule. If further imaging is desired, dedicated thyroid ultrasound may be considered. Aortic Atherosclerosis (ICD10-I70.0). Electronically Signed   By: DFidela SalisburyM.D.   On: 02/12/2018 11:13   Dg Chest Port 1 View  Result Date: 02/11/2018 CLINICAL DATA:  Hemoptysis EXAM: PORTABLE CHEST 1 VIEW COMPARISON:  10/15/2017 FINDINGS: Left IJ dialysis catheter tips proximal mid right atrium. Stable cardiomegaly without CHF. Chronic left basilar scarring as before with blunting of left costophrenic angle. No definite superimposed acute process or CHF. No pneumothorax. Trachea is midline. Previous median sternotomy noted. Aorta atherosclerotic. Degenerative changes noted of the spine. IMPRESSION: Stable cardiomegaly and left basilar scarring. No interval change or superimposed acute process. Electronically Signed   By: MJerilynn Mages  Shick M.D.   On: 02/11/2018 17:57     Medications:   .  ceFAZolin (ANCEF) IV Stopped (02/11/18 2153)   . acetaminophen  650 mg Oral Q8H  . apixaban  2.5 mg Oral BID  . aspirin EC  81 mg Oral Daily  . atorvastatin  80 mg Oral QHS  . bisacodyl  10 mg Rectal Daily  . calcitRIOL  0.5 mcg Oral Q breakfast  . carvedilol  3.125 mg Oral BID WC  . Chlorhexidine Gluconate Cloth  6 each Topical Q0600  . cholecalciferol  400 Units Oral Once per day on Mon Wed Fri  . docusate sodium  100 mg Oral BID  . epoetin (EPOGEN/PROCRIT) injection  4,000 Units Intravenous Q M,W,F-HD  . famotidine  20 mg Oral Daily  . feeding supplement (NEPRO CARB STEADY)  237 mL Oral BID BM  .  HYDROmorphone (DILAUDID) injection  0.5 mg Intravenous Once  . insulin aspart  0-5 Units Subcutaneous QHS  . insulin aspart  0-9 Units Subcutaneous TID WC  . isosorbide  mononitrate  30 mg Oral Daily  . LORazepam  0.5 mg Intravenous Once  . [  START ON 02/14/2018] midodrine  10 mg Oral Once per day on Tue Thu Sat  . mirtazapine  7.5 mg Oral QHS  . multivitamin  1 tablet Oral QHS  . multivitamin with minerals  1 tablet Oral Daily  . multivitamin-lutein  1 capsule Oral Daily  . sevelamer carbonate  1,600 mg Oral TID WC   acetaminophen **OR** acetaminophen, HYDROmorphone (DILAUDID) injection, metoCLOPramide (REGLAN) injection, nitroGLYCERIN, ondansetron **OR** ondansetron (ZOFRAN) IV, oxyCODONE, traZODone  Assessment/ Plan:  [name] is a 67 y.o. african [place] female with end stage renal disease on peritoneal dialysis, hypertension, congestive heart failure, coronary artery disease status post CABG, diabetes mellitus type II insulin dependent, GERD, peripheral vascular disease admitted to [facility] on 01/29/2018 for leg pain.    [facility] CCPD 10 hours 5 exchanges 2 liter fills  Transition to hemodialysis.   1. End Stage Renal Disease with hyperkalemia: transitioned from PD to hemodialysis.  - patient did not tolerate her last 2 dialysis sessions.  We will observe how well she does with dialysis today.  Midodrine increased.  2.  Staphylococcus aureus bacteremia/Sepsis/ Left great toe osteomyelitis/endocarditis Temporary dialysis catheter placed on 10/18 after right permcath was removed  - Cefazolin for 6 weeks  3. Anemia of chronic kidney disease: hemoglobin down a bit 7.2.  Continue to monitor closely. - hemoglobin bit lower at 7.6.  Still on Epogen 4000 units.  We may need to consider increasing this dosage.  Has active infection and Epogen may not be fully effective.   4. Secondary Hyperparathyroidism:  - continue to periodically monitor serum phosphorus.  5. Hypertension:  - start midodrine with hemodialysis, we have increased this to 48m with each dialysis session.   6. Peripheral vascular  disease: left first toe with gangrene and pain.  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 Agree with above plan.   SLavonia Dana MLake VillaKidney  10/24/20194:25 PM

## 2018-02-12 NOTE — Consult Note (Signed)
Pulmonary Medicine Consultation Progress Note         Date: 02/12/2018,   MRN# [id number] Stephanie Ellis January 27, 1951 Code Status:     Code Status Orders  (From admission, onward)         Start     Ordered   02/01/18 0413  Full code  Continuous     02/01/18 0412        Code Status History    Date Active Date Inactive Code Status Order ID Comments User Context   01/31/2018 2300 02/01/2018 0412 DNR [id number]  Amelia Jo, MD Inpatient   01/31/2018 2037 01/31/2018 2300 DNR [id number]  Awilda Bill, NP Inpatient   01/29/2018 2250 01/31/2018 2037 Full Code [id number]  Amelia Jo, MD Inpatient   10/24/2016 1055 10/25/2016 2243 Full Code [id number]  Hillary Bow, MD ED   10/17/2016 2143 10/19/2016 1440 Full Code [id number]  Vaughan Basta, MD Inpatient   09/04/2016 1708 09/05/2016 1500 Full Code [id number]  Epifanio Lesches, MD ED   08/22/2016 0938 08/22/2016 1833 Full Code [id number]  Algernon Huxley, MD Inpatient   06/27/2016 1121 06/27/2016 1846 Full Code [id number]  Algernon Huxley, MD Inpatient   10/03/2015 2235 10/04/2015 1724 Full Code [id number]  Harrie Foreman, MD Inpatient     Hosp day:@LENGTHOFSTAYDAYS @ Referring MD: @ATDPROV @     PCP:      AdmissionWeight: 59 kg                 CurrentWeight: 58.4 kg Stephanie Ellis is a 67 y.o. old female seen in consultation for hemoptysis at the request of Dr Marchia Bond.     CHIEF COMPLAINT:   Hemoptysis   SUBJECTIVE    Patient sitting in chair in no distress, reports breathing without difficulty no additional hemoptysis/epistaxis, states less pain in foot today also.   PAST MEDICAL HISTORY   Past Medical History:  Diagnosis Date  . (HFpEF) heart failure with preserved ejection fraction ([place])    a. 01/2018 Echo: EF 50-55%, no rwma, Gr1 DD, triv AI, mild MR. Midly dil LA/RV, mod reduced RV fxn. Irreg thickening of TV w/ mobile echodensity that appears to arise from valve.  . Anemia   . Anorexia   . Bacteremia due to  Pseudomonas   . Carotid arterial disease ([place])    a. 01/2017 Carotid U/S: RICA <87, LICA <57.  Marland Kitchen Complication of anesthesia    a. Pt reports h/o complication on 9 different occasions - ? hypotension/arrest.  . 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 01/2015 Cath [facility]): 70% LM, p-mLAD 50-60% (Resting FFR 0.75), mRCA 80-90%, ~40 Ost OM & D1-->CABG; b. 01/2016 Staged PCI of LCX x 2 and RCA.  Marland Kitchen ESRD (end stage renal disease) on dialysis ([place])    a. ESRD secondary to acute kidney failure s/p CABG-->PD  . Essential hypertension   . GERD (gastroesophageal reflux disease)   . Heart murmur   . Hypercholesterolemia   . Myocardial infarction ([place]) 2016  . PVD (peripheral vascular disease) ([place])    a. 01/30/2018 PV Angio: Sev Left Popliteal dzs s/p PTA w/ 22m lutonix DEB w/ mech aspiration of the L popliteal, L AT, and Left tibioperoneal trunck and peroneal artery.  . 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  [facility]: Dr. BMarland KitchenHaithcock: CABG x 3, LIMA to LAD, SVG to RCA, SVG to OM3, EVH  . Sinusitis 2019  . Type II diabetes mellitus with complication (HCC)  CAD     SURGICAL HISTORY   Past Surgical History:  Procedure Laterality Date  . ABDOMINAL HYSTERECTOMY    . AMPUTATION TOE Left 02/06/2018   Procedure: AMPUTATION GREAT TOE;  Surgeon: Samara Deist, DPM;  Location: [facility] ORS;  Service: Podiatry;  Laterality: Left;  . ARTERIOVENOUS GRAFT PLACEMENT  05/2016  . AV FISTULA PLACEMENT Left 05/30/2016   Procedure: ARTERIOVENOUS graft;  Surgeon: Algernon Huxley, MD;  Location: [facility];  Service: Vascular;  Laterality: Left;  . CAPD INSERTION N/A 10/30/2017   Procedure: LAPAROSCOPIC INSERTION CONTINUOUS AMBULATORY PERITONEAL DIALYSIS  (CAPD) CATHETER;  Surgeon: Algernon Huxley, MD;  Location: [facility] OR[facility];  Service: Vascular;  Laterality: N/A;  . CARDIAC CATHETERIZATION  01/2015   [facility]: Ost LM 70%, p-m LAD 50-60% (Rest FFR + @ 0.75), mRCA 80-90%, ostD1 40%, pOM1 40%  . CATARACT  EXTRACTION W/PHACO Left 01/18/2016   Procedure: CATARACT EXTRACTION PHACO AND INTRAOCULAR LENS PLACEMENT (IOC);  Surgeon: Eulogio Bear, MD;  Location: [facility];  Service: Ophthalmology;  Laterality: Left;  [place] 1.05 AP% 15.5 CDE 10.16 Fluid Pack Lot # [id number]  . CATARACT EXTRACTION W/PHACO Right 08/01/2016   Procedure: CATARACT EXTRACTION PHACO AND INTRAOCULAR LENS PLACEMENT (IOC);  Surgeon: Eulogio Bear, MD;  Location: [facility];  Service: Ophthalmology;  Laterality: Right;  [place] 01:00.6 AP% 11.4 CDE 6.93  LOT # [id number]  . COLONOSCOPY    . CORONARY ANGIOPLASTY     SENTS 02/12/16  . CORONARY ARTERY BYPASS GRAFT  03/28/15    [facility]: Dr. Waldemar Dickens: LIMA to LAD, SVG to RCA, SVG to OM3, EVH  . DIALYSIS/PERMA CATHETER INSERTION N/A 02/11/2018   Procedure: DIALYSIS/PERMA CATHETER INSERTION;  Surgeon: Algernon Huxley, MD;  Location: [place] [facility];  Service: Cardiovascular;  Laterality: N/A;  . DIALYSIS/PERMA CATHETER REMOVAL Right 02/04/2018   Procedure: DIALYSIS/PERMA CATHETER REMOVAL;  Surgeon: Katha Cabal, MD;  Location: [place] [facility];  Service: Cardiovascular;  Laterality: Right;  . ESOPHAGOGASTRODUODENOSCOPY (EGD) WITH PROPOFOL N/A 10/24/2016   Procedure: ESOPHAGOGASTRODUODENOSCOPY (EGD) WITH PROPOFOL;  Surgeon: [name], MD;  Location: [facility];  Service: Endoscopy;  Laterality: N/A;  . EYE SURGERY Bilateral    cataract surgery  . EYE SURGERY     drains for glaucoma  . INSERTION EXPRESS TUBE SHUNT Right 08/01/2016   Procedure: INSERTION EXPRESS TUBE SHUNT;  Surgeon: Eulogio Bear, MD;  Location: [facility];  Service: Ophthalmology;  Laterality: Right;  . INSERTION OF AHMED VALVE Left 08/15/2016   Procedure: INSERTION OF AHMED VALVE;  Surgeon: Eulogio Bear, MD;  Location: [facility];  Service: Ophthalmology;  Laterality: Left;  . INSERTION OF DIALYSIS CATHETER    . LOWER EXTREMITY ANGIOGRAPHY Left 01/30/2018   Procedure: Lower Extremity Angiography;   Surgeon: Katha Cabal, MD;  Location: [place] [facility];  Service: Cardiovascular;  Laterality: Left;  . TEMPORARY DIALYSIS CATHETER N/A 02/06/2018   Procedure: TEMPORARY DIALYSIS CATHETER;  Surgeon: Katha Cabal, MD;  Location: [place] [facility];  Service: Cardiovascular;  Laterality: N/A;  . TRANSTHORACIC ECHOCARDIOGRAM  January 2017    EF 60-65%. GR 2 DD. Mild degenerative mitral valve disease but no prolapse or regurgitation. Mild left atrial dilation. Mild to moderate LVH. Pericardial effusion gone  . UPPER EXTREMITY ANGIOGRAPHY Left 06/27/2016   Procedure: Upper Extremity Angiography;  Surgeon: Algernon Huxley, MD;  Location: [place] [facility];  Service: Cardiovascular;  Laterality: Left;  . UPPER EXTREMITY ANGIOGRAPHY Left 08/22/2016   Procedure: Upper Extremity  Angiography;  Surgeon: Algernon Huxley, MD;  Location: [place] [facility];  Service: Cardiovascular;  Laterality: Left;     FAMILY HISTORY   Family History  Problem Relation Age of Onset  . Diabetes Mellitus II Mother   . Pancreatic cancer Father      SOCIAL HISTORY   Social History   Tobacco Use  . Smoking status: Never Smoker  . Smokeless tobacco: Never Used  Substance Use Topics  . Alcohol use: No  . Drug use: No     MEDICATIONS    Home Medication:    Current Medication:  Current Facility-Administered Medications:  .  acetaminophen (TYLENOL) tablet 650 mg, 650 mg, Oral, Q6H PRN, 650 mg at 02/08/18 0158 **OR** acetaminophen (TYLENOL) suppository 650 mg, 650 mg, Rectal, Q6H PRN, Algernon Huxley, MD .  acetaminophen (TYLENOL) tablet 650 mg, 650 mg, Oral, Q8H, Dew, Erskine Squibb, MD, 650 mg at 02/12/18 0542 .  apixaban (ELIQUIS) tablet 2.5 mg, 2.5 mg, Oral, BID, Demetrios Loll, MD, 2.5 mg at 02/12/18 0954 .  aspirin EC tablet 81 mg, 81 mg, Oral, Daily, Dew, Erskine Squibb, MD, 81 mg at 02/10/18 1250 .  atorvastatin (LIPITOR) tablet 80 mg, 80 mg, Oral, QHS, Algernon Huxley, MD, 80 mg at 02/11/18 2117 .  bisacodyl  (DULCOLAX) suppository 10 mg, 10 mg, Rectal, Daily, Dew, Erskine Squibb, MD, 10 mg at 02/08/18 1655 .  calcitRIOL (ROCALTROL) capsule 0.5 mcg, 0.5 mcg, Oral, Q breakfast, Dew, Erskine Squibb, MD, 0.5 mcg at 02/12/18 0753 .  carvedilol (COREG) tablet 3.125 mg, 3.125 mg, Oral, BID WC, Algernon Huxley, MD, 3.125 mg at 02/12/18 0753 .  ceFAZolin (ANCEF) IVPB 1 g/50 mL premix, 1 g, Intravenous, Q24H, Dew, Erskine Squibb, MD, Stopped at 02/11/18 2153 .  Chlorhexidine Gluconate Cloth 2 % PADS 6 each, 6 each, Topical, Q0600, Algernon Huxley, MD, 6 each at 02/12/18 [phone] .  cholecalciferol (VITAMIN D) tablet 400 Units, 400 Units, Oral, Once per day on Mon Wed Fri, Dew, Jason S, MD, 400 Units at 02/06/18 0806 .  docusate sodium (COLACE) capsule 100 mg, 100 mg, Oral, BID, Algernon Huxley, MD, 100 mg at 02/12/18 0956 .  epoetin alfa (EPOGEN,PROCRIT) injection 4,000 Units, 4,000 Units, Intravenous, Q M,W,F-HD, Algernon Huxley, MD, 4,000 Units at 02/11/18 1611 .  famotidine (PEPCID) tablet 20 mg, 20 mg, Oral, Daily, Dew, Erskine Squibb, MD, 20 mg at 02/12/18 0956 .  feeding supplement (NEPRO CARB STEADY) liquid 237 mL, 237 mL, Oral, BID BM, Algernon Huxley, MD, 237 mL at 02/08/18 0850 .  HYDROmorphone (DILAUDID) injection 0.5 mg, 0.5 mg, Intravenous, Q2H PRN, Algernon Huxley, MD, 0.5 mg at 02/10/18 0929 .  HYDROmorphone (DILAUDID) injection 0.5 mg, 0.5 mg, Intravenous, Once, Dew, Erskine Squibb, MD .  insulin aspart (novoLOG) injection 0-5 Units, 0-5 Units, Subcutaneous, QHS, Algernon Huxley, MD, 2 Units at 02/11/18 2123 .  insulin aspart (novoLOG) injection 0-9 Units, 0-9 Units, Subcutaneous, TID WC, Algernon Huxley, MD, 2 Units at 02/12/18 0751 .  isosorbide mononitrate (IMDUR) 24 hr tablet 30 mg, 30 mg, Oral, Daily, Dew, Erskine Squibb, MD, 30 mg at 02/12/18 0955 .  LORazepam (ATIVAN) injection 0.5 mg, 0.5 mg, Intravenous, Once, Dew, Erskine Squibb, MD .  metoCLOPramide (REGLAN) injection 5 mg, 5 mg, Intravenous, Q8H PRN, Algernon Huxley, MD, 5 mg at 01/31/18 1605 .  [START ON  02/14/2018] midodrine (PROAMATINE) tablet 10 mg, 10 mg, Oral, Once per day on Tue Thu Sat, Lateef,  Munsoor, MD .  mirtazapine (REMERON) tablet 7.5 mg, 7.5 mg, Oral, QHS, Dew, Erskine Squibb, MD, 7.5 mg at 02/11/18 2118 .  multivitamin (RENA-VIT) tablet 1 tablet, 1 tablet, Oral, QHS, Dew, Erskine Squibb, MD, 1 tablet at 02/11/18 2117 .  multivitamin with minerals tablet 1 tablet, 1 tablet, Oral, Daily, Algernon Huxley, MD, 1 tablet at 02/12/18 0953 .  multivitamin-lutein (OCUVITE-LUTEIN) capsule 1 capsule, 1 capsule, Oral, Daily, Dew, Erskine Squibb, MD, 1 capsule at 02/12/18 1151 .  nitroGLYCERIN (NITROSTAT) SL tablet 0.4 mg, 0.4 mg, Sublingual, Q5 min PRN, Lucky Cowboy, Erskine Squibb, MD .  ondansetron (ZOFRAN) tablet 4 mg, 4 mg, Oral, Q6H PRN **OR** ondansetron (ZOFRAN) injection 4 mg, 4 mg, Intravenous, Q6H PRN, Algernon Huxley, MD, 4 mg at 02/10/18 0744 .  oxyCODONE (Oxy IR/ROXICODONE) immediate release tablet 5 mg, 5 mg, Oral, Q3H PRN, Algernon Huxley, MD, 5 mg at 02/08/18 1640 .  sevelamer carbonate (RENVELA) tablet 1,600 mg, 1,600 mg, Oral, TID WC, Algernon Huxley, MD, 1,600 mg at 02/12/18 1152 .  traZODone (DESYREL) tablet 25 mg, 25 mg, Oral, QHS PRN, Dew, Erskine Squibb, MD    ALLERGIES   Chlorthalidone; Fentanyl; Midazolam; Ace inhibitors; Angiotensin receptor blockers; Norvasc [amlodipine]; and Phenergan [promethazine hcl]     REVIEW OF SYSTEMS    Review of Systems:  Gen:  Denies  fever, sweats, chills weigh loss  HEENT: Denies blurred vision, double vision, ear pain, eye pain, hearing loss, nose bleeds, sore throat Cardiac:  No dizziness, chest pain or heaviness, chest tightness,edema Resp:   Denies cough or sputum porduction, shortness of breath,wheezing, hemoptysis,  Gi: Denies swallowing difficulty, stomach pain, nausea or vomiting, diarrhea, constipation, bowel incontinence Gu:  Denies bladder incontinence, burning urine Ext:   Denies Joint pain, stiffness or swelling Skin: Denies  skin rash, easy bruising or bleeding or  hives Endoc:  Denies polyuria, polydipsia , polyphagia or weight change Psych:   Denies depression, insomnia or hallucinations   Other:  All other systems negative   VS: BP (!) 138/52   Pulse 81   Temp 98.4 F (36.9 C) (Oral)   Resp 20   Ht 5' 4"  (1.626 m)   Wt 58.4 kg   SpO2 99%   BMI 22.10 kg/m      PHYSICAL EXAM  Physical Examination:   GENERAL:NAD, no fevers, chills, no weakness no fatigue HEAD: Normocephalic, atraumatic.  EYES: Pupils equal, round, reactive to light. Extraocular muscles intact. No scleral icterus.  MOUTH: Moist mucosal membrane. Dentition intact. No abscess noted.  EAR, NOSE, THROAT: Clear without exudates. No external lesions.  NECK: Supple. No thyromegaly. No nodules. No JVD.  PULMONARY:   Mild crackles bilaterally no wheezing no rhonchi CARDIOVASCULAR: S1 and S2. Regular rate and rhythm. No murmurs, rubs, or gallops. No edema. Pedal pulses 2+ bilaterally.  GASTROINTESTINAL: Soft, nontender, nondistended. No masses. Positive bowel sounds. No hepatosplenomegaly.  MUSCULOSKELETAL: No swelling, clubbing, or edema. LLE ischemic changes with dressing around ankle  NEUROLOGIC: Cranial nerves II through XII are intact. No gross focal neurological deficits. Sensation intact. Reflexes intact.  SKIN: No ulceration, lesions, rashes, or cyanosis. Skin warm and dry. Turgor intact.  PSYCHIATRIC: Mood, affect within normal limits. The patient is awake, alert and oriented x 3. Insight, judgment intact.  ALL OTHER ROS ARE NEGATIVE     IMAGING    Dg Tibia/fibula Left  Result Date: 01/29/2018 CLINICAL DATA:  Presents with ED via ems from home Having pain to both lower legs But states  left leg is worse Increased pain with palpation Decreased pulses to foot EXAM: LEFT TIBIA AND FIBULA - 2 VIEW COMPARISON:  None. FINDINGS: No fracture.  No bone lesion. Knee and ankle joints are normally aligned. There are arterial vascular calcifications. Soft tissues are otherwise  unremarkable. IMPRESSION: Negative Electronically Signed   By: Lajean Manes M.D.   On: 01/29/2018 20:58   Ct Chest W Contrast  Result Date: 02/12/2018 CLINICAL DATA:  Recent placement of PermCath with an episode of hemoptysis. Patient also presenting with lethargy and signs of sepsis. EXAM: CT CHEST WITH CONTRAST TECHNIQUE: Multidetector CT imaging of the chest was performed during intravenous contrast administration. CONTRAST:  81m OMNIPAQUE IOHEXOL 300 MG/ML  SOLN COMPARISON:  Chest radiograph 02/11/2018 FINDINGS: Cardiovascular: Dual lumen central venous catheter from left IJ approach terminates in the right atrium. There is a small amount of subcutaneous emphysema adjacent to the skin entrance site. No perivascular hematoma is seen along the catheter course. Enlarged cardiac silhouette. Heavy calcific atherosclerotic disease of the coronary arteries and aorta. Post CABG postsurgical changes. Mediastinum/Nodes: No evidence of lymphadenopathy. 12 mm circumscribed hypoattenuated left thyroid nodule. Lungs/Pleura: No evidence of pneumothorax or pleural effusion. Linear airspace opacities in bilateral lower lungs may represent atelectasis versus scarring. Punctate calcifications are noted along bilateral pleural surfaces. Upper Abdomen: Scattered calcifications throughout the spleen. Punctate vascular calcifications versus small nonobstructive calculi in bilateral kidneys. Musculoskeletal: No chest wall abnormality. No acute or significant osseous findings. IMPRESSION: Dual lumen left IJ approach central venous catheter terminates in the right atrium. Small amount of subcutaneous emphysema adjacent to the skin entrance site, expected postprocedural finding. No evidence of perivascular hematoma. No evidence of pneumothorax. Bilateral lower lobes pulmonary scarring versus atelectasis. Scattered calcifications along the pleural surfaces in bilateral lower lobes may represent evidence of prior granulomatous  disease or asbestos exposure. Heavy calcific atherosclerotic disease of the aorta and coronary arteries. Postsurgical changes of CABG. Cardiomegaly. 12 mm left thyroid nodule. If further imaging is desired, dedicated thyroid ultrasound may be considered. Aortic Atherosclerosis (ICD10-I70.0). Electronically Signed   By: DFidela SalisburyM.D.   On: 02/12/2018 11:13   [place]ous Img Lower Unilateral Left  Result Date: 01/29/2018 CLINICAL DATA:  Left leg pain EXAM: LEFT LOWER EXTREMITY VENOUS DOPPLER ULTRASOUND TECHNIQUE: Gray-scale sonography with graded compression, as well as color Doppler and duplex ultrasound were performed to evaluate the lower extremity deep venous systems from the level of the common femoral vein and including the common femoral, femoral, profunda femoral, popliteal and calf veins including the posterior tibial, peroneal and gastrocnemius veins when visible. The superficial great saphenous vein was also interrogated. Spectral Doppler was utilized to evaluate flow at rest and with distal augmentation maneuvers in the common femoral, femoral and popliteal veins. COMPARISON:  None. FINDINGS: Contralateral Common Femoral Vein: Respiratory phasicity is normal and symmetric with the symptomatic side. No evidence of thrombus. Normal compressibility. Common Femoral Vein: No evidence of thrombus. Normal compressibility, respiratory phasicity and response to augmentation. Saphenofemoral Junction: No evidence of thrombus. Normal compressibility and flow on color Doppler imaging. Profunda Femoral Vein: No evidence of thrombus. Normal compressibility and flow on color Doppler imaging. Femoral Vein: No evidence of thrombus. Normal compressibility, respiratory phasicity and response to augmentation. Popliteal Vein: No evidence of thrombus. Normal compressibility, respiratory phasicity and response to augmentation. Calf Veins: No evidence of thrombus. Normal compressibility and flow on color Doppler  imaging. Superficial Great Saphenous Vein: No evidence of thrombus. Normal compressibility. Venous Reflux:  None. Other Findings:  None. IMPRESSION: No evidence of deep venous thrombosis. Electronically Signed   By: Rolm Baptise M.D.   On: 01/29/2018 18:36   Dg Chest Port 1 View  Result Date: 02/11/2018 CLINICAL DATA:  Hemoptysis EXAM: PORTABLE CHEST 1 VIEW COMPARISON:  10/15/2017 FINDINGS: Left IJ dialysis catheter tips proximal mid right atrium. Stable cardiomegaly without CHF. Chronic left basilar scarring as before with blunting of left costophrenic angle. No definite superimposed acute process or CHF. No pneumothorax. Trachea is midline. Previous median sternotomy noted. Aorta atherosclerotic. Degenerative changes noted of the spine. IMPRESSION: Stable cardiomegaly and left basilar scarring. No interval change or superimposed acute process. Electronically Signed   By: Jerilynn Mages.  Shick M.D.   On: 02/11/2018 17:57   Dg Toe Great Left  Result Date: 01/29/2018 CLINICAL DATA:  Open wound to the left great toe for several months. EXAM: LEFT GREAT TOE COMPARISON:  10/07/2017 FINDINGS: Degenerative changes in the first metatarsal-phalangeal joint. Soft tissue defect over the plantar aspect of the left first toe. There is underlying erosion of the distal phalangeal tuft cortex suggesting focal osteomyelitis. No evidence of acute fracture or dislocation. Diffuse bone demineralization. Prominent vascular calcifications. IMPRESSION: Soft tissue defect over the plantar aspect of the left first toe with underlying erosion of the distal phalangeal tuft suggesting osteomyelitis. Electronically Signed   By: Lucienne Capers M.D.   On: 01/29/2018 21:30      ASSESSMENT/PLAN    Non-massiveemoptysis    - no additional episodes since initial presentation    - Reviewed CT chest today - no pulmonary septic emboli noted, mild atelectasis bilaterally with small amount of fluid in fissures on both sides,  There is  significant motion artifact but airways are patent without debris to suggest clotted blood or active bleeding at least in larger segmental airways that I was able to appreciate.  Mild paratracheal lymph node enlargemnt station 4R suggestive of expected reactive lymphadenopathy.  - Lab findings with leukocytosis trending down as well as vitals with absence of fevers and currently normal oxygen saturation on room air - Recommend Bronchopulmonary hygiene to treat atelectatic segements -  including q6h nebulized hypertonic saline while awake and incentive spirometry at bedside to be used multiple times each hour and MetaNeb therapy bid.   - Patient scheduled for HD today so it is expected the mild pulm edema will improve post HD -will hold off on bronchoscopy for now unless hemoptysis recurrs    Patient/Family are satisfied with Plan of action and management. All questions answered   Will sign off, please call with any questions,   Ottie Glazier, MD Division of Pulmonary and Critical Care Medicine 12:40 PM 02/12/2018

## 2018-02-12 NOTE — Progress Notes (Signed)
Pre HD assessment    02/12/18 1412  Neurological  Level of Consciousness Alert  Orientation Level Oriented X4  Respiratory  Respiratory Pattern Regular;Unlabored  Chest Assessment Chest expansion symmetrical  Cardiac  ECG Monitor Yes  Cardiac Rhythm NSR  Vascular  R Radial Pulse +2  L Radial Pulse +2  Edema Left lower extremity  Integumentary  Integumentary (WDL) X  Skin Color Appropriate for ethnicity  Musculoskeletal  Musculoskeletal (WDL) X  Generalized Weakness Yes  Assistive Device None  GU Assessment  Genitourinary (WDL) X  Genitourinary Symptoms  (HD)  Psychosocial  Psychosocial (WDL) WDL

## 2018-02-12 NOTE — Progress Notes (Signed)
Patient transported to dialysis in dialysis chair

## 2018-02-12 NOTE — Progress Notes (Signed)
ANTICOAGULATION CONSULT NOTE - Initial Consult  Pharmacy Consult for apixaban Indication: PAD  Allergies  Allergen Reactions  . Chlorthalidone Anaphylaxis, Itching and Rash  . Fentanyl Rash  . Midazolam Rash  . Ace Inhibitors Other (See Comments)    Reaction:  Hyperkalemia, agitation   . Angiotensin Receptor Blockers Other (See Comments)    Reaction:  Hyperkalemia, agitation   . Norvasc [Amlodipine] Itching and Rash  . Phenergan [Promethazine Hcl] Anxiety    "antsy, can't sit still"    Patient Measurements: Height: 5' 4"  (162.6 cm) Weight: 128 lb 12 oz (58.4 kg) IBW/kg (Calculated) : 54.7 Heparin Dosing Weight:   Vital Signs: Temp: 98.4 F (36.9 C) (10/24 0559) Temp Source: Oral (10/24 0559) BP: 138/52 (10/24 0750) Pulse Rate: 81 (10/24 0559)  Labs: Recent Labs    02/10/18 0308 02/10/18 1229 02/11/18 0350 02/11/18 1321 02/12/18 0801  HGB 7.9*  --  7.5* 7.2* 7.6*  HCT 26.1*  --  26.0* 24.7* 26.4*  PLT 276  --  284 295 307  HEPARINUNFRC 0.35 0.41 0.16*  --   --   CREATININE  --   --  9.55*  --  6.19*    Estimated Creatinine Clearance: 7.7 mL/min (A) (by C-G formula based on SCr of 6.19 mg/dL (H)).   Medical History: Past Medical History:  Diagnosis Date  . (HFpEF) heart failure with preserved ejection fraction ([place])    a. 01/2018 Echo: EF 50-55%, no rwma, Gr1 DD, triv AI, mild MR. Midly dil LA/RV, mod reduced RV fxn. Irreg thickening of TV w/ mobile echodensity that appears to arise from valve.  . Anemia   . Anorexia   . Bacteremia due to Pseudomonas   . Carotid arterial disease ([place])    a. 01/2017 Carotid U/S: RICA <16, LICA <60.  Marland Kitchen Complication of anesthesia    a. Pt reports h/o complication on 9 different occasions - ? hypotension/arrest.  . 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 01/2015 Cath [facility]): 70% LM, p-mLAD 50-60% (Resting FFR 0.75), mRCA 80-90%, ~40 Ost OM & D1-->CABG; b. 01/2016 Staged PCI of LCX x 2 and RCA.   Marland Kitchen ESRD (end stage renal disease) on dialysis ([place])    a. ESRD secondary to acute kidney failure s/p CABG-->PD  . Essential hypertension   . GERD (gastroesophageal reflux disease)   . Heart murmur   . Hypercholesterolemia   . Myocardial infarction ([place]) 2016  . PVD (peripheral vascular disease) ([place])    a. 01/30/2018 [facility] Angio: Sev Left Popliteal dzs s/p PTA w/ 63m lutonix DEB w/ mech aspiration of the L popliteal, L AT, and Left tibioperoneal trunck and peroneal artery.  . 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  [facility]: Dr. BMarland KitchenHaithcock: CABG x 3, LIMA to LAD, SVG to RCA, SVG to OM3, EVH  . Sinusitis 2019  . Type II diabetes mellitus with complication (HCC)    CAD    Medications:  Infusions:  .  ceFAZolin (ANCEF) IV Stopped (02/11/18 2153)    Assessment: 89yof with sepsis MSSA from left great toe osteomyelitis and PMH ESRD, tricuspid endocarditis, acute ischemia of left foot s/p angioplasty of left popliteal (PAD) for which vascular is recommending OAC, T2DM, HFpEF, peripheral neuropathy. Patient has been on unfractionated heparin while permcath issues have been revised. Today pharmacy is consulted to dose apixaban for PAD and vascular requests the lower dose. No OAC noted on PTA list. Patient has had several issues with bleeding while  on UFH - will proceed with the lower dose apixaban and monitor H&H closely.  Goal of Therapy:  Monitor platelets by anticoagulation protocol: Yes   Plan:  Apixaban 2.5 mg po BID. Follow CBC and SCr every three days per protocol and H&H as available.   Laural Benes, Pharm.D., BCPS Clinical Pharmacist 02/12/2018,8:53 AM

## 2018-02-13 LAB — GLUCOSE, CAPILLARY
Glucose-Capillary: 82 mg/dL (ref 70–99)
Glucose-Capillary: 218 mg/dL — ABNORMAL HIGH (ref 70–99)
Glucose-Capillary: 159 mg/dL — ABNORMAL HIGH (ref 70–99)

## 2018-02-13 MED ORDER — BISACODYL 10 MG RE SUPP
10.0000 mg | Freq: Every day | RECTAL | Status: DC | PRN
  Administered 2018-02-13 – 2018-02-14 (×2): 10 mg via RECTAL
  Filled 2018-02-13 (×2): qty 1

## 2018-02-13 NOTE — Progress Notes (Addendum)
[facility]                                                                                                                                                                                  Patient Demographics   Stephanie Ellis, is a 67 y.o. female, DOB - 04-20-1951, TIW:[id number]  Admit date - 01/29/2018   Admitting Physician Amelia Jo, MD  Outpatient Primary MD for the patient is System, Pcp Not In   LOS - 15  Subjective:  The patient has epistaxis per RN just now. Review of Systems:   CONSTITUTIONAL: No documented fever. No fatigue, weakness. No weight gain, no weight loss.  EYES: No blurry or double vision.  ENT: No tinnitus. No postnasal drip. No redness of the oropharynx.  RESPIRATORY: No cough, no wheeze, no hemoptysis. No dyspnea. No hemoptysis and epistaxis. CARDIOVASCULAR: No chest pain. No orthopnea. No palpitations. No syncope.  GASTROINTESTINAL: Had nausea and vomiting, no abdominal pain.   GENITOURINARY: No dysuria or hematuria.  ENDOCRINE: No polyuria or nocturia. No heat or cold intolerance.  HEMATOLOGY: No anemia. No bruising. No bleeding.  INTEGUMENTARY: No rashes. No lesions.  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 NEUROLOGIC: No numbness, tingling, or ataxia. No seizure-type activity.  PSYCHIATRIC: No anxiety. No insomnia. No ADD.    Vitals:   Vitals:   02/13/18 0423 02/13/18 0500 02/13/18 0859 02/13/18 1218  BP: (!) 119/49  (!) 120/50 (!) 122/50  Pulse: 80  80 80  Resp:  18  20  Temp: 98.5 F (36.9 C)   98.7 F (37.1 C)  TempSrc: Oral   Oral  SpO2: 96%   97%  Weight:  59.1 kg    Height:        Wt Readings from Last 3 Encounters:  02/13/18 59.1 kg  10/15/17 58.1 kg  10/09/17 58.1 kg     Intake/Output Summary (Last 24 hours) at 02/13/2018 1513 Last data filed at 02/13/2018 1039 Gross per 24 hour  Intake 779.45 ml  Output 5 ml  Net 774.45 ml    Physical Exam:   GENERAL:  Pleasant-appearing in no apparent distress.  HEAD, EYES, EARS, NOSE AND THROAT: Atraumatic, normocephalic. Extraocular muscles are intact. Pupils equal and reactive to light. Sclerae anicteric. No conjunctival injection. No oro-pharyngeal erythema.  NECK: Supple. There is no jugular venous distention. No bruits, no lymphadenopathy, no thyromegaly.  HEART: Regular rate and rhythm,. No murmurs, no rubs, no clicks.  LUNGS: Clear to auscultation bilaterally. No rales or rhonchi. No wheezes.  ABDOMEN: Soft, flat, nontender, nondistended. Has good bowel sounds. No hepatosplenomegaly appreciated.  EXTREMITIES: Status left great toe amputation in dressing. NEUROLOGIC:  The patient is alert, awake, and oriented x3 with no focal motor or sensory deficits appreciated bilaterally.  SKIN: Moist and warm with no rashes appreciated.  Psych: Not anxious, depressed LN: No inguinal LN enlargement    Antibiotics   Anti-infectives (From admission, onward)   Start     Dose/Rate Route Frequency Ordered Stop   02/11/18 0921  ceFAZolin (ANCEF) 1-4 GM/50ML-% IVPB    Note to Pharmacy:  Carlynn Spry   : cabinet override      02/11/18 0921 02/11/18 2129   02/11/18 0000  ceFAZolin (ANCEF) IVPB 1 g/50 mL premix    Note to Pharmacy:  Send with pt to OR   1 g 100 mL/hr over 30 Minutes Intravenous On call 02/10/18 2136 02/12/18 0000   02/06/18 0832  ceFAZolin (ANCEF) 1-4 GM/50ML-% IVPB    Note to Pharmacy:  Norton Blizzard  : cabinet override      02/06/18 0832 02/06/18 0855   02/06/18 0831  vancomycin (VANCOCIN) 1-5 GM/200ML-% IVPB  Status:  Discontinued    Note to Pharmacy:  Norton Blizzard  : cabinet override      02/06/18 0831 02/06/18 0846   01/31/18 2200  ceFAZolin (ANCEF) IVPB 1 g/50 mL premix     1 g 100 mL/hr over 30 Minutes Intravenous Every 24 hours 01/31/18 1409     01/30/18 1800  vancomycin (VANCOCIN) IVPB 1000 mg/200 mL premix  Status:  Discontinued     1,000 mg 200 mL/hr over 60 Minutes  Intravenous Every M-W-F (Hemodialysis) 01/30/18 0435 01/30/18 0906   01/30/18 1000  piperacillin-tazobactam (ZOSYN) IVPB 3.375 g  Status:  Discontinued     3.375 g 12.5 mL/hr over 240 Minutes Intravenous Every 12 hours 01/30/18 0435 01/30/18 0906   01/30/18 1000  ceFAZolin (ANCEF) 500 mg in dextrose 5 % 100 mL IVPB  Status:  Discontinued     500 mg 210 mL/hr over 30 Minutes Intravenous Every 12 hours 01/30/18 0906 01/31/18 1409   01/29/18 2200  piperacillin-tazobactam (ZOSYN) IVPB 3.375 g     3.375 g 100 mL/hr over 30 Minutes Intravenous  Once 01/29/18 2146 01/29/18 2242   01/29/18 2200  vancomycin (VANCOCIN) IVPB 1000 mg/200 mL premix     1,000 mg 200 mL/hr over 60 Minutes Intravenous  Once 01/29/18 2146 01/29/18 2314      Medications   Scheduled Meds: . acetaminophen  650 mg Oral Q8H  . apixaban  2.5 mg Oral BID  . aspirin EC  81 mg Oral Daily  . atorvastatin  80 mg Oral QHS  . bisacodyl  10 mg Rectal Daily  . calcitRIOL  0.5 mcg Oral Q breakfast  . carvedilol  3.125 mg Oral BID WC  . Chlorhexidine Gluconate Cloth  6 each Topical Q0600  . cholecalciferol  400 Units Oral Once per day on Mon Wed Fri  . docusate sodium  100 mg Oral BID  . epoetin (EPOGEN/PROCRIT) injection  4,000 Units Intravenous Q M,W,F-HD  . famotidine  20 mg Oral Daily  . feeding supplement (NEPRO CARB STEADY)  237 mL Oral BID BM  .  HYDROmorphone (DILAUDID) injection  0.5 mg Intravenous Once  . insulin aspart  0-5 Units Subcutaneous QHS  . insulin aspart  0-9 Units Subcutaneous TID WC  . isosorbide mononitrate  30 mg Oral Daily  . LORazepam  0.5 mg Intravenous Once  . [START ON 02/14/2018] midodrine  10 mg Oral Once per day on Tue Thu Sat  . mirtazapine  7.5 mg Oral  QHS  . multivitamin  1 tablet Oral QHS  . multivitamin with minerals  1 tablet Oral Daily  . multivitamin-lutein  1 capsule Oral Daily  . sevelamer carbonate  1,600 mg Oral TID WC   Continuous Infusions: .  ceFAZolin (ANCEF) IV Stopped  (02/12/18 2152)   PRN Meds:.acetaminophen **OR** acetaminophen, bisacodyl, HYDROmorphone (DILAUDID) injection, metoCLOPramide (REGLAN) injection, nitroGLYCERIN, ondansetron **OR** ondansetron (ZOFRAN) IV, oxyCODONE, traZODone   Data Review:   Micro Results Recent Results (from the past 240 hour(s))  MRSA PCR Screening     Status: None   Collection Time: 02/05/18 11:23 PM  Result Value Ref Range Status   MRSA by PCR NEGATIVE NEGATIVE Final    Comment:        The GeneXpert MRSA Assay (FDA approved for NASAL specimens only), is one component of a comprehensive MRSA colonization surveillance program. It is not intended to diagnose MRSA infection nor to guide or monitor treatment for MRSA infections. Performed at [facility], [address] [place], [place] [address]   Aerobic/Anaerobic Culture (surgical/deep wound)     Status: None   Collection Time: 02/06/18  9:11 AM  Result Value Ref Range Status   Specimen Description TOE GREAT LEFT  Final   Special Requests   Final    NONE Performed at [facility], [address], [place], [place] [address]    Gram Stain NO WBC SEEN NO ORGANISMS SEEN   Final   Culture   Final    RARE STAPHYLOCOCCUS AUREUS NO ANAEROBES ISOLATED Performed at [facility], [place] [address] [place], [place] [address]    Report Status 02/12/2018 FINAL  Final   Organism ID, Bacteria STAPHYLOCOCCUS AUREUS  Final      Susceptibility   Staphylococcus aureus - MIC*    CIPROFLOXACIN <=0.5 SENSITIVE Sensitive     ERYTHROMYCIN <=0.25 SENSITIVE Sensitive     GENTAMICIN <=0.5 SENSITIVE Sensitive     OXACILLIN <=0.25 SENSITIVE Sensitive     TETRACYCLINE <=1 SENSITIVE Sensitive     VANCOMYCIN <=0.5 SENSITIVE Sensitive     TRIMETH/SULFA <=10 SENSITIVE Sensitive     CLINDAMYCIN <=0.25 SENSITIVE Sensitive     RIFAMPIN <=0.5 SENSITIVE Sensitive     Inducible Clindamycin NEGATIVE Sensitive     * RARE STAPHYLOCOCCUS AUREUS  Culture, blood  (Routine X 2) w Reflex to ID Panel     Status: None (Preliminary result)   Collection Time: 02/13/18  1:09 AM  Result Value Ref Range Status   Specimen Description BLOOD RIGHT ANTECUBITAL  Final   Special Requests   Final    BOTTLES DRAWN AEROBIC AND ANAEROBIC Blood Culture results may not be optimal due to an excessive volume of blood received in culture bottles   Culture   Final    NO GROWTH < 12 HOURS Performed at [facility], [address], [place], [place] [address]    Report Status PENDING  Incomplete  Culture, blood (Routine X 2) w Reflex to ID Panel     Status: None (Preliminary result)   Collection Time: 02/13/18  1:09 AM  Result Value Ref Range Status   Specimen Description BLOOD BLOOD LEFT HAND  Final   Special Requests   Final    BOTTLES DRAWN AEROBIC AND ANAEROBIC Blood Culture results may not be optimal due to an excessive volume of blood received in culture bottles   Culture   Final    NO GROWTH < 12 HOURS Performed at [facility], [address] [place], [place] [address]    Report Status PENDING  Incomplete    Radiology Reports Dg Tibia/fibula Left  Result Date: 01/29/2018 CLINICAL DATA:  Presents with ED via ems from home Having pain to both lower legs But states left leg is worse Increased pain with palpation Decreased pulses to foot EXAM: LEFT TIBIA AND FIBULA - 2 VIEW COMPARISON:  None. FINDINGS: No fracture.  No bone lesion. Knee and ankle joints are normally aligned. There are arterial vascular calcifications. Soft tissues are otherwise unremarkable. IMPRESSION: Negative Electronically Signed   By: Lajean Manes M.D.   On: 01/29/2018 20:58   Ct Chest W Contrast  Result Date: 02/12/2018 CLINICAL DATA:  Recent placement of PermCath with an episode of hemoptysis. Patient also presenting with lethargy and signs of sepsis. EXAM: CT CHEST WITH CONTRAST TECHNIQUE: Multidetector CT imaging of the chest was performed during intravenous contrast  administration. CONTRAST:  24m OMNIPAQUE IOHEXOL 300 MG/ML  SOLN COMPARISON:  Chest radiograph 02/11/2018 FINDINGS: Cardiovascular: Dual lumen central venous catheter from left IJ approach terminates in the right atrium. There is a small amount of subcutaneous emphysema adjacent to the skin entrance site. No perivascular hematoma is seen along the catheter course. Enlarged cardiac silhouette. Heavy calcific atherosclerotic disease of the coronary arteries and aorta. Post CABG postsurgical changes. Mediastinum/Nodes: No evidence of lymphadenopathy. 12 mm circumscribed hypoattenuated left thyroid nodule. Lungs/Pleura: No evidence of pneumothorax or pleural effusion. Linear airspace opacities in bilateral lower lungs may represent atelectasis versus scarring. Punctate calcifications are noted along bilateral pleural surfaces. Upper Abdomen: Scattered calcifications throughout the spleen. Punctate vascular calcifications versus small nonobstructive calculi in bilateral kidneys. Musculoskeletal: No chest wall abnormality. No acute or significant osseous findings. IMPRESSION: Dual lumen left IJ approach central venous catheter terminates in the right atrium. Small amount of subcutaneous emphysema adjacent to the skin entrance site, expected postprocedural finding. No evidence of perivascular hematoma. No evidence of pneumothorax. Bilateral lower lobes pulmonary scarring versus atelectasis. Scattered calcifications along the pleural surfaces in bilateral lower lobes may represent evidence of prior granulomatous disease or asbestos exposure. Heavy calcific atherosclerotic disease of the aorta and coronary arteries. Postsurgical changes of CABG. Cardiomegaly. 12 mm left thyroid nodule. If further imaging is desired, dedicated thyroid ultrasound may be considered. Aortic Atherosclerosis (ICD10-I70.0). Electronically Signed   By: DFidela SalisburyM.D.   On: 02/12/2018 11:13   UKoreaVenous Img Lower Unilateral Left  Result  Date: 01/29/2018 CLINICAL DATA:  Left leg pain EXAM: LEFT LOWER EXTREMITY VENOUS DOPPLER ULTRASOUND TECHNIQUE: Gray-scale sonography with graded compression, as well as color Doppler and duplex ultrasound were performed to evaluate the lower extremity deep venous systems from the level of the common femoral vein and including the common femoral, femoral, profunda femoral, popliteal and calf veins including the posterior tibial, peroneal and gastrocnemius veins when visible. The superficial great saphenous vein was also interrogated. Spectral Doppler was utilized to evaluate flow at rest and with distal augmentation maneuvers in the common femoral, femoral and popliteal veins. COMPARISON:  None. FINDINGS: Contralateral Common Femoral Vein: Respiratory phasicity is normal and symmetric with the symptomatic side. No evidence of thrombus. Normal compressibility. Common Femoral Vein: No evidence of thrombus. Normal compressibility, respiratory phasicity and response to augmentation. Saphenofemoral Junction: No evidence of thrombus. Normal compressibility and flow on color Doppler imaging. Profunda Femoral Vein: No evidence of thrombus. Normal compressibility and flow on color Doppler imaging. Femoral Vein: No evidence of thrombus. Normal compressibility, respiratory phasicity and response to augmentation. Popliteal  Vein: No evidence of thrombus. Normal compressibility, respiratory phasicity and response to augmentation. Calf Veins: No evidence of thrombus. Normal compressibility and flow on color Doppler imaging. Superficial Great Saphenous Vein: No evidence of thrombus. Normal compressibility. Venous Reflux:  None. Other Findings:  None. IMPRESSION: No evidence of deep venous thrombosis. Electronically Signed   By: Rolm Baptise M.D.   On: 01/29/2018 18:36   Dg Chest Port 1 View  Result Date: 02/11/2018 CLINICAL DATA:  Hemoptysis EXAM: PORTABLE CHEST 1 VIEW COMPARISON:  10/15/2017 FINDINGS: Left IJ dialysis  catheter tips proximal mid right atrium. Stable cardiomegaly without CHF. Chronic left basilar scarring as before with blunting of left costophrenic angle. No definite superimposed acute process or CHF. No pneumothorax. Trachea is midline. Previous median sternotomy noted. Aorta atherosclerotic. Degenerative changes noted of the spine. IMPRESSION: Stable cardiomegaly and left basilar scarring. No interval change or superimposed acute process. Electronically Signed   By: Jerilynn Mages.  Shick M.D.   On: 02/11/2018 17:57   Dg Toe Great Left  Result Date: 01/29/2018 CLINICAL DATA:  Open wound to the left great toe for several months. EXAM: LEFT GREAT TOE COMPARISON:  10/07/2017 FINDINGS: Degenerative changes in the first metatarsal-phalangeal joint. Soft tissue defect over the plantar aspect of the left first toe. There is underlying erosion of the distal phalangeal tuft cortex suggesting focal osteomyelitis. No evidence of acute fracture or dislocation. Diffuse bone demineralization. Prominent vascular calcifications. IMPRESSION: Soft tissue defect over the plantar aspect of the left first toe with underlying erosion of the distal phalangeal tuft suggesting osteomyelitis. Electronically Signed   By: Lucienne Capers M.D.   On: 01/29/2018 21:30     CBC Recent Labs  Lab 02/09/18 0803 02/10/18 0308 02/11/18 0350 02/11/18 1321 02/12/18 0801  WBC 23.8* 23.6* 20.3* 20.6* 17.2*  HGB 8.1* 7.9* 7.5* 7.2* 7.6*  HCT 27.1* 26.1* 26.0* 24.7* 26.4*  PLT 249 276 284 295 307  MCV 101.5* 101.2* 103.2* 103.3* 104.3*  MCH 30.3 30.6 29.8 30.1 30.0  MCHC 29.9* 30.3 28.8* 29.1* 28.8*  RDW 21.4* 21.8* 21.9* 22.2* 22.3*    Chemistries  Recent Labs  Lab 02/07/18 0545 02/08/18 0439 02/11/18 0350 02/12/18 0801  NA 135 139 134* 137  K 5.5* 4.3 5.3* 4.6  CL 90* 96* 92* 92*  CO2 26 32 25 29  GLUCOSE 187* 244* 208* 155*  BUN 62* 38* 75* 39*  CREATININE 9.29* 5.74* 9.55* 6.19*  CALCIUM 8.6* 7.9* 8.0* 7.9*    ------------------------------------------------------------------------------------------------------------------ estimated creatinine clearance is 7.7 mL/min (A) (by C-G formula based on SCr of 6.19 mg/dL (H)). ------------------------------------------------------------------------------------------------------------------ No results for input(s): HGBA1C in the last 72 hours. ------------------------------------------------------------------------------------------------------------------ No results for input(s): CHOL, HDL, LDLCALC, TRIG, CHOLHDL, LDLDIRECT in the last 72 hours. ------------------------------------------------------------------------------------------------------------------ No results for input(s): TSH, T4TOTAL, T3FREE, THYROIDAB in the last 72 hours.  Invalid input(s): FREET3 ------------------------------------------------------------------------------------------------------------------ No results for input(s): VITAMINB12, FOLATE, FERRITIN, TIBC, IRON, RETICCTPCT in the last 72 hours.  Coagulation profile No results for input(s): INR, PROTIME in the last 168 hours.  No results for input(s): DDIMER in the last 72 hours.  Cardiac Enzymes No results for input(s): CKMB, TROPONINI, MYOGLOBIN in the last 168 hours.  Invalid input(s): CK ------------------------------------------------------------------------------------------------------------------ Invalid input(s): [place]  Patient admitted with sepsis   1.  Sepsis,  with MSSA with left great toe osteomyelitis, Tricuspid endocarditis.   Status post removal of her permacath by vascular and temporary dialysis catheter. Repeat blood cultures from 10/11 has been negative so  far.  Better lleukocytosis. Per Dr. Steva Ready, will need 6 weeks of IV Cefazolin which can be given during dialysis so she does not need another catheter  leukocytosis is improving. 2 grams, 2grams and 3 grams until  03/13/18 with weekly CBC/CMP.  Acute ischemia of the left foot status post angioplasty of left popliteal by vascular , heparin drip was discontinued due to epistaxis.  Resumed Heparin drip.  But the patient had hemoptysis and epistaxis.  Discontinued heparin drip.  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 Dressing changed 3 x's per week. F/u with podiatry in 2 weeks per Dr. Vickki Muff.  2.    Acute encephalopathy due to #1 now improved.   3.  PAD.,  Left leg ischemia, status post hospitalization, continue aspirin and statins. Heparin drip is restarted, which is discontinued due to hemoptysis and epistaxis.   May start low dose Eliquis after permacath per Dr. Lucky Cowboy. Started Eliquis.   4.  End-stage renal disease on peritoneal dialysis.     Patient does not want peritoneal dialysis anymore. S/P right temp HD catheter. S/P permacath placement and continue hemodialysis today per Dr. Holley Raring.   5.  Diabetes type 2.  controlled, continue insulin with sliding scale coverage. Continue carb controlled renal diet.  6.  Chronic diastolic CHF, LV EF: 77% -   55%,  currently clinically compensated, continue maintenance therapy.  Continue Coreg, Imdur, midodrine.  7.  Peripheral neuropathy, continue pain medication.  #8. nausea likely secondary to sepsis/narcotic induced: And possible diabetic gastroparesis: continue Reglan prn, PPI, improved.  #.9. Hyperkalemia.  Discontinued ibesartan, improved with hemodialysis.  Hemoptysis and epistaxis.  Discontinued heparin drip, chest x-ray and CT are unremarkable.  Improved.  Anemia of chronic disease.  Hemoglobin decreased to 7.2.  Follow-up hemoglobin 7.6, on Epo during dialysis PRBC transfusion PRN.  Outpatient dialysis coordinator stated that patient was not able to start outpatient dialysis until Tuesday and will need to remain in hospital in order to receive dialysis treatments.  I discussed with Dr. Holley Raring PT suggest SNF.     Code Status Orders  (From admission, onward)         Start     Ordered   01/29/18 2250  Full code  Continuous     01/29/18 2250        Code Status History    Date Active Date Inactive Code Status Order ID Comments User Context   10/24/2016 1055 10/25/2016 2243 Full Code [id number]  Hillary Bow, MD ED   10/17/2016 2143 10/19/2016 1440 Full Code [id number]  Vaughan Basta, MD Inpatient   09/04/2016 1708 09/05/2016 1500 Full Code [id number]  Epifanio Lesches, MD ED   08/22/2016 0938 08/22/2016 1833 Full Code [id number]  Algernon Huxley, MD Inpatient   06/27/2016 1121 06/27/2016 1846 Full Code [id number]  Algernon Huxley, MD Inpatient   10/03/2015 2235 10/04/2015 1724 Full Code [id number]  Harrie Foreman, MD Inpatient           Consults  NEPHROLOGY AND VASCULAR SURGERY   HEPRIN  Lab Results  Component Value Date   PLT 307 02/12/2018   High risk for cardiac arrest secondary to multiple medical problems. Time Spent in minutes 25 MIN   Greater than 50% of time spent in care coordination and counseling patient regarding the condition and plan of care.  Demetrios Loll M.D on 02/13/2018 at 3:13 PM  Between 7am to 6pm - Pager - [phone]  After 6pm go to [email or web address] - [place]  [employer]  [phone]

## 2018-02-13 NOTE — Clinical Social Work Note (Signed)
Outpatient dialysis coordinator stated that patient was not able to start outpatient dialysis until Tuesday and will need to remain in hospital in order to receive dialysis treatments. CSW has updated [employer]. Shela Leff MSW,LCSW [phone]

## 2018-02-13 NOTE — Progress Notes (Signed)
Patient requesting suppository, "I need to have a BM and I can't, please give me a suppository". None on PRN list; Hospitalist on-call paged; awaiting callback. Barbaraann Faster, RN 12:36 AM 02/13/2018

## 2018-02-13 NOTE — Consult Note (Signed)
ANTICOAGULATION CONSULT NOTE - Follow Up Consult  Pharmacy Consult for apixiban Indication:PAD  Allergies  Allergen Reactions  . Chlorthalidone Anaphylaxis, Itching and Rash  . Fentanyl Rash  . Midazolam Rash  . Ace Inhibitors Other (See Comments)    Reaction:  Hyperkalemia, agitation   . Angiotensin Receptor Blockers Other (See Comments)    Reaction:  Hyperkalemia, agitation   . Norvasc [Amlodipine] Itching and Rash  . Phenergan [Promethazine Hcl] Anxiety    "antsy, can't sit still"    Patient Measurements: Height: 5' 4"  (162.6 cm) Weight: 130 lb 4.7 oz (59.1 kg) IBW/kg (Calculated) : 54.7   Vital Signs: Temp: 98.5 F (36.9 C) (10/25 0423) Temp Source: Oral (10/25 0423) BP: 120/50 (10/25 0859) Pulse Rate: 80 (10/25 0859)  Labs: Recent Labs    02/10/18 1229  02/11/18 0350 02/11/18 1321 02/12/18 0801  HGB  --    < > 7.5* 7.2* 7.6*  HCT  --   --  26.0* 24.7* 26.4*  PLT  --   --  284 295 307  HEPARINUNFRC 0.41  --  0.16*  --  <0.10*  CREATININE  --   --  9.55*  --  6.19*   < > = values in this interval not displayed.    Estimated Creatinine Clearance: 7.7 mL/min (A) (by C-G formula based on SCr of 6.19 mg/dL (H)).   Medications:  Infusions:  .  ceFAZolin (ANCEF) IV Stopped (02/12/18 2152)   Assessment: 44 yof with sepsis MSSA from left great toe osteomyelitis and PMH ESRD, tricuspid endocarditis, acute ischemia of left foot s/p angioplasty of left popliteal (PAD) for which vascular is recommending OAC, T2DM, HFpEF, peripheral neuropathy. Patient has been on unfractionated heparin while permcath issues have been revised. Today pharmacy will continue to dose apixaban for PAD and vascular requests the lower dose. No OAC noted on PTA list. Patient has had several issues with bleeding while on UFH - will proceed with the lower dose apixaban and monitor H&H closely.   Goal of Therapy:   Monitor platelets by anticoagulation protocol: Yes   Plan:  Apixaban 2.5 mg  po BID. Follow CBC and SCr every three days (2 more days) per protocol and H&H as available  Lu Duffel, PharmD Clinical Pharmacist 02/13/2018 9:58 AM

## 2018-02-13 NOTE — Consult Note (Signed)
PHARMACY CONSULT NOTE FOR:  OUTPATIENT  PARENTERAL ANTIBIOTIC THERAPY (OPAT)  Indication: Osteomyelitis/Endocarditis Regimen: After dialyses- Cefazolin - 2g Tuesday, 2g Thursday, 3g Saturday until complete End date: 03/13/18  Please check weekly CBC's with CMP's  IV antibiotic discharge orders are pended. To discharging provider:  please sign these orders via discharge navigator,  Select New Orders & click on the button choice - Manage This Unsigned Work.     Thank you for allowing pharmacy to be a part of this patient's care.  Lu Duffel, PharmD Clinical Pharmacist 02/13/2018 3:41 PM

## 2018-02-13 NOTE — Progress Notes (Signed)
Dr. Duane Boston notified of patient request for suppository; acknowledged; new order written. Barbaraann Faster, RN 12:49 AM 02/13/2018

## 2018-02-13 NOTE — Care Management Important Message (Signed)
Copy of signed IM left with patient in room.  

## 2018-02-13 NOTE — Progress Notes (Signed)
[facility]  ROUNDING NOTE   Subjective:  Patient tolerated dialysis treatment better yesterday. Resting comfortably in bed today.  Objective:  Vital signs in last 24 hours:  Temp:  [97.8 F (36.6 C)-98.7 F (37.1 C)] 98.7 F (37.1 C) (10/25 1218) Pulse Rate:  [56-80] 80 (10/25 1218) Resp:  [11-23] 20 (10/25 1218) BP: (108-148)/(43-57) 122/50 (10/25 1218) SpO2:  [94 %-100 %] 97 % (10/25 1218) Weight:  [58.4 kg-59.1 kg] 59.1 kg (10/25 0500)  Weight change: 0 kg Filed Weights   02/12/18 1411 02/12/18 1658 02/13/18 0500  Weight: 58.4 kg 58.4 kg 59.1 kg    Intake/Output: I/O last 3 completed shifts: In: 489.4 [P.O.:360; IV Piggyback:129.4] Out: 5 [Urine:3; Other:2]   Intake/Output this shift:  Total I/O In: 620 [P.O.:620] Out: -   Physical Exam: General: NAD  Head: Normocephalic, atraumatic. Moist oral mucosal membranes  Eyes: Anicteric  Neck: Supple, trachea midline  Lungs:  Clear to auscultation  Heart: Regular rate and rhythm  Abdomen:  Soft, nontender  Extremities: no peripheral edema.  Neurologic: Nonfocal, moving all four extremities  Skin: No lesions  Access: PD catheter, left IJ tunneled catheter, right femoral temp catheter .     Basic Metabolic Panel: Recent Labs  Lab 02/07/18 0545 02/08/18 0439 02/11/18 0350 02/12/18 0801  NA 135 139 134* 137  K 5.5* 4.3 5.3* 4.6  CL 90* 96* 92* 92*  CO2 26 32 25 29  GLUCOSE 187* 244* 208* 155*  BUN 62* 38* 75* 39*  CREATININE 9.29* 5.74* 9.55* 6.19*  CALCIUM 8.6* 7.9* 8.0* 7.9*    Liver Function Tests: No results for input(s): AST, ALT, ALKPHOS, BILITOT, PROT, ALBUMIN in the last 168 hours. No results for input(s): LIPASE, AMYLASE in the last 168 hours. No results for input(s): AMMONIA in the last 168 hours.  CBC: Recent Labs  Lab 02/09/18 0803 02/10/18 0308 02/11/18 0350 02/11/18 1321 02/12/18 0801  WBC 23.8* 23.6* 20.3* 20.6* 17.2*  HGB 8.1* 7.9* 7.5* 7.2* 7.6*  HCT 27.1* 26.1*  26.0* 24.7* 26.4*  MCV 101.5* 101.2* 103.2* 103.3* 104.3*  PLT 249 276 284 295 307    Cardiac Enzymes: No results for input(s): CKTOTAL, CKMB, CKMBINDEX, TROPONINI in the last 168 hours.  BNP: Invalid input(s): POCBNP  CBG: Recent Labs  Lab 02/12/18 1119 02/12/18 1736 02/12/18 2128 02/13/18 0739 02/13/18 1215  GLUCAP 105* 137* 252* 82 218*    Microbiology: Results for orders placed or performed during the hospital encounter of 01/29/18  Blood Culture (routine x 2)     Status: Abnormal   Collection Time: 01/29/18  9:55 PM  Result Value Ref Range Status   Specimen Description   Final    BLOOD RIGHT ANTECUBITAL Performed at [facility], [address] [place], [place] [address]    Special Requests   Final    BOTTLES DRAWN AEROBIC AND ANAEROBIC Blood Culture adequate volume Performed at [facility], [address] [place], [place] [address]    Culture  Setup Time   Final    GRAM POSITIVE COCCI IN CLUSTERS IN BOTH AEROBIC AND ANAEROBIC BOTTLES CRITICAL RESULT CALLED TO, READ BACK BY AND VERIFIED WITH: KAREN HAYES 01/30/18 0839 JGF Performed at [facility], [address] [place], [place] [address]    Culture (A)  Final    STAPHYLOCOCCUS AUREUS SUSCEPTIBILITIES PERFORMED ON PREVIOUS CULTURE WITHIN THE LAST 5 DAYS. Performed at [facility], [place] [address] [place], [place] [address]    Report  Status 02/01/2018 FINAL  Final  Blood Culture (routine x 2)     Status: Abnormal   Collection Time: 01/29/18  9:55 PM  Result Value Ref Range Status   Specimen Description   Final    BLOOD RIGHT ANTECUBITAL Performed at [facility], [address], [place], [place] [address]    Special Requests   Final    BOTTLES DRAWN AEROBIC AND ANAEROBIC Blood Culture adequate volume Performed at [facility], [address] [place], [place] [address]    Culture  Setup Time   Final    GRAM POSITIVE COCCI IN CLUSTERS IN BOTH  AEROBIC AND ANAEROBIC BOTTLES CRITICAL RESULT CALLED TO, READ BACK BY AND VERIFIED WITH: KAREN HAYES 01/30/18 0839 JGF Performed at [facility], [place] [address], [place], [place] [address]    Culture STAPHYLOCOCCUS AUREUS (A)  Final   Report Status 02/01/2018 FINAL  Final   Organism ID, Bacteria STAPHYLOCOCCUS AUREUS  Final      Susceptibility   Staphylococcus aureus - MIC*    CIPROFLOXACIN <=0.5 SENSITIVE Sensitive     ERYTHROMYCIN <=0.25 SENSITIVE Sensitive     GENTAMICIN <=0.5 SENSITIVE Sensitive     OXACILLIN 0.5 SENSITIVE Sensitive     TETRACYCLINE <=1 SENSITIVE Sensitive     VANCOMYCIN <=0.5 SENSITIVE Sensitive     TRIMETH/SULFA <=10 SENSITIVE Sensitive     CLINDAMYCIN <=0.25 SENSITIVE Sensitive     RIFAMPIN <=0.5 SENSITIVE Sensitive     Inducible Clindamycin NEGATIVE Sensitive     * STAPHYLOCOCCUS AUREUS  Blood Culture ID Panel (Reflexed)     Status: Abnormal   Collection Time: 01/29/18  9:55 PM  Result Value Ref Range Status   Enterococcus species NOT DETECTED NOT DETECTED Final   Vancomycin resistance NOT DETECTED NOT DETECTED Final   Listeria monocytogenes NOT DETECTED NOT DETECTED Final   Staphylococcus species DETECTED (A) NOT DETECTED Final    Comment: CRITICAL RESULT CALLED TO, READ BACK BY AND VERIFIED WITH: KAREN HAYES 01/30/18 0839 JGF    Staphylococcus aureus (BCID) DETECTED (A) NOT DETECTED Final    Comment: CRITICAL RESULT CALLED TO, READ BACK BY AND VERIFIED WITH: KAREN HAYES 01/30/18 0839 JGF Methicillin (oxacillin) susceptible Staphylococcus aureus (MSSA). Preferred therapy is anti staphylococcal beta lactam antibiotic (Cefazolin or Nafcillin), unless clinically contraindicated.    Methicillin resistance NOT DETECTED NOT DETECTED Final   Streptococcus species NOT DETECTED NOT DETECTED Final   Streptococcus agalactiae NOT DETECTED NOT DETECTED Final   Streptococcus pneumoniae NOT DETECTED NOT DETECTED Final   Streptococcus pyogenes NOT DETECTED NOT  DETECTED Final   Acinetobacter baumannii NOT DETECTED NOT DETECTED Final   Enterobacteriaceae species NOT DETECTED NOT DETECTED Final   Enterobacter cloacae complex NOT DETECTED NOT DETECTED Final   Escherichia coli NOT DETECTED NOT DETECTED Final   Klebsiella oxytoca NOT DETECTED NOT DETECTED Final   Klebsiella pneumoniae NOT DETECTED NOT DETECTED Final   Proteus species NOT DETECTED NOT DETECTED Final   Serratia marcescens NOT DETECTED NOT DETECTED Final   Carbapenem resistance NOT DETECTED NOT DETECTED Final   Haemophilus influenzae NOT DETECTED NOT DETECTED Final   Neisseria meningitidis NOT DETECTED NOT DETECTED Final   Pseudomonas aeruginosa NOT DETECTED NOT DETECTED Final   Candida albicans NOT DETECTED NOT DETECTED Final   Candida glabrata NOT DETECTED NOT DETECTED Final   Candida krusei NOT DETECTED NOT DETECTED Final   Candida parapsilosis NOT DETECTED NOT DETECTED Final   Candida tropicalis NOT DETECTED NOT DETECTED Final  Comment: Performed at [facility], [address] [place], [place] [address]  MRSA PCR Screening     Status: None   Collection Time: 01/29/18 10:55 PM  Result Value Ref Range Status   MRSA by PCR NEGATIVE NEGATIVE Final    Comment:        The GeneXpert MRSA Assay (FDA approved for NASAL specimens only), is one component of a comprehensive MRSA colonization surveillance program. It is not intended to diagnose MRSA infection nor to guide or monitor treatment for MRSA infections. Performed at [facility], [address] [place], [place] [address]   Culture, blood (Routine X 2) w Reflex to ID Panel     Status: None   Collection Time: 01/30/18  9:27 AM  Result Value Ref Range Status   Specimen Description BLOOD RIGHT ANTECUBITAL  Final   Special Requests   Final    BOTTLES DRAWN AEROBIC AND ANAEROBIC Blood Culture adequate volume   Culture   Final    NO GROWTH 5 DAYS Performed at [facility], [address] [place], [place] [address]    Report Status 02/04/2018 FINAL  Final  MRSA PCR Screening     Status: None   Collection Time: 02/05/18 11:23 PM  Result Value Ref Range Status   MRSA by PCR NEGATIVE NEGATIVE Final    Comment:        The GeneXpert MRSA Assay (FDA approved for NASAL specimens only), is one component of a comprehensive MRSA colonization surveillance program. It is not intended to diagnose MRSA infection nor to guide or monitor treatment for MRSA infections. Performed at [facility], [address], [place], [place] [address]   Aerobic/Anaerobic Culture (surgical/deep wound)     Status: None   Collection Time: 02/06/18  9:11 AM  Result Value Ref Range Status   Specimen Description TOE GREAT LEFT  Final   Special Requests   Final    NONE Performed at [facility], [address], [place], [place] [address]    Gram Stain NO WBC SEEN NO ORGANISMS SEEN   Final   Culture   Final    RARE STAPHYLOCOCCUS AUREUS NO ANAEROBES ISOLATED Performed at [facility], [place] [address], [place],  [address]    Report Status 02/12/2018 FINAL  Final   Organism ID, Bacteria STAPHYLOCOCCUS AUREUS  Final      Susceptibility   Staphylococcus aureus - MIC*    CIPROFLOXACIN <=0.5 SENSITIVE Sensitive     ERYTHROMYCIN <=0.25 SENSITIVE Sensitive     GENTAMICIN <=0.5 SENSITIVE Sensitive     OXACILLIN <=0.25 SENSITIVE Sensitive     TETRACYCLINE <=1 SENSITIVE Sensitive     VANCOMYCIN <=0.5 SENSITIVE Sensitive     TRIMETH/SULFA <=10 SENSITIVE Sensitive     CLINDAMYCIN <=0.25 SENSITIVE Sensitive     RIFAMPIN <=0.5 SENSITIVE Sensitive     Inducible Clindamycin NEGATIVE Sensitive     * RARE STAPHYLOCOCCUS AUREUS  Culture, blood (Routine X 2) w Reflex to ID Panel     Status: None (Preliminary result)   Collection Time: 02/13/18  1:09 AM  Result Value Ref Range Status   Specimen Description BLOOD RIGHT ANTECUBITAL  Final   Special Requests   Final    BOTTLES  DRAWN AEROBIC AND ANAEROBIC Blood Culture results may not be optimal due to an excessive volume of blood received in culture bottles   Culture   Final    NO GROWTH < 12 HOURS Performed at [facility], [address], [place], [place] [address]    Report Status PENDING  Incomplete  Culture, blood (Routine X 2) w Reflex to ID Panel     Status: None (Preliminary result)   Collection Time: 02/13/18  1:09 AM  Result Value Ref Range Status   Specimen Description BLOOD BLOOD LEFT HAND  Final   Special Requests   Final    BOTTLES DRAWN AEROBIC AND ANAEROBIC Blood Culture results may not be optimal due to an excessive volume of blood received in culture bottles   Culture   Final    NO GROWTH < 12 HOURS Performed at [facility], [address] [place], [place] [address]    Report Status PENDING  Incomplete    Coagulation Studies: No results for input(s): LABPROT, INR in the last 72 hours.  Urinalysis: No results for input(s): COLORURINE, LABSPEC, PHURINE, GLUCOSEU, HGBUR, BILIRUBINUR, KETONESUR, PROTEINUR, UROBILINOGEN, NITRITE, LEUKOCYTESUR in the last 72 hours.  Invalid input(s): APPERANCEUR    Imaging: Ct Chest W Contrast  Result Date: 02/12/2018 CLINICAL DATA:  Recent placement of PermCath with an episode of hemoptysis. Patient also presenting with lethargy and signs of sepsis. EXAM: CT CHEST WITH CONTRAST TECHNIQUE: Multidetector CT imaging of the chest was performed during intravenous contrast administration. CONTRAST:  108m OMNIPAQUE IOHEXOL 300 MG/ML  SOLN COMPARISON:  Chest radiograph 02/11/2018 FINDINGS: Cardiovascular: Dual lumen central venous catheter from left IJ approach terminates in the right atrium. There is a small amount of subcutaneous emphysema adjacent to the skin entrance site. No perivascular hematoma is seen along the catheter course. Enlarged cardiac silhouette. Heavy calcific atherosclerotic disease of the coronary arteries and aorta. Post CABG  postsurgical changes. Mediastinum/Nodes: No evidence of lymphadenopathy. 12 mm circumscribed hypoattenuated left thyroid nodule. Lungs/Pleura: No evidence of pneumothorax or pleural effusion. Linear airspace opacities in bilateral lower lungs may represent atelectasis versus scarring. Punctate calcifications are noted along bilateral pleural surfaces. Upper Abdomen: Scattered calcifications throughout the spleen. Punctate vascular calcifications versus small nonobstructive calculi in bilateral kidneys. Musculoskeletal: No chest wall abnormality. No acute or significant osseous findings. IMPRESSION: Dual lumen left IJ approach central venous catheter terminates in the right atrium. Small amount of subcutaneous emphysema adjacent to the skin entrance site, expected postprocedural finding. No evidence of perivascular hematoma. No evidence of pneumothorax. Bilateral lower lobes pulmonary scarring versus atelectasis. Scattered calcifications along the pleural surfaces in bilateral lower lobes may represent evidence of prior granulomatous disease or asbestos exposure. Heavy calcific atherosclerotic disease of the aorta and coronary arteries. Postsurgical changes of CABG. Cardiomegaly. 12 mm left thyroid nodule. If further imaging is desired, dedicated thyroid ultrasound may be considered. Aortic Atherosclerosis (ICD10-I70.0). Electronically Signed   By: DFidela SalisburyM.D.   On: 02/12/2018 11:13   Dg Chest Port 1 View  Result Date: 02/11/2018 CLINICAL DATA:  Hemoptysis EXAM: PORTABLE CHEST 1 VIEW COMPARISON:  10/15/2017 FINDINGS: Left IJ dialysis catheter tips proximal mid right atrium. Stable cardiomegaly without CHF. Chronic left basilar scarring as before with blunting of left costophrenic angle. No definite superimposed acute process or CHF. No pneumothorax. Trachea is midline. Previous median sternotomy noted. Aorta atherosclerotic. Degenerative changes noted of the spine. IMPRESSION: Stable cardiomegaly  and left basilar scarring. No interval change or superimposed acute process. Electronically Signed   By: MJerilynn Mages  Shick M.D.   On: 02/11/2018 17:57     Medications:   .  ceFAZolin (ANCEF) IV Stopped (02/12/18 2152)   . acetaminophen  650 mg Oral Q8H  . apixaban  2.5 mg Oral BID  . aspirin EC  81 mg Oral Daily  . atorvastatin  80 mg Oral QHS  . calcitRIOL  0.5 mcg Oral Q breakfast  . carvedilol  3.125 mg Oral BID WC  . Chlorhexidine Gluconate Cloth  6 each Topical Q0600  . cholecalciferol  400 Units Oral Once per day on Mon Wed Fri  . docusate sodium  100 mg Oral BID  . epoetin (EPOGEN/PROCRIT) injection  4,000 Units Intravenous Q M,W,F-HD  . famotidine  20 mg Oral Daily  . feeding supplement (NEPRO CARB STEADY)  237 mL Oral BID BM  .  HYDROmorphone (DILAUDID) injection  0.5 mg Intravenous Once  . insulin aspart  0-5 Units Subcutaneous QHS  . insulin aspart  0-9 Units Subcutaneous TID WC  . isosorbide mononitrate  30 mg Oral Daily  . LORazepam  0.5 mg Intravenous Once  . [START ON 02/14/2018] midodrine  10 mg Oral Once per day on Tue Thu Sat  . mirtazapine  7.5 mg Oral QHS  . multivitamin  1 tablet Oral QHS  . multivitamin with minerals  1 tablet Oral Daily  . multivitamin-lutein  1 capsule Oral Daily  . sevelamer carbonate  1,600 mg Oral TID WC   acetaminophen **OR** acetaminophen, bisacodyl, HYDROmorphone (DILAUDID) injection, metoCLOPramide (REGLAN) injection, nitroGLYCERIN, ondansetron **OR** ondansetron (ZOFRAN) IV, oxyCODONE, traZODone  Assessment/ Plan:  [name] is a 67 y.o. african [place] female with end stage renal disease on peritoneal dialysis, hypertension, congestive heart failure, coronary artery disease status post CABG, diabetes mellitus type II insulin dependent, GERD, peripheral vascular disease admitted to [facility] on 01/29/2018 for leg pain.    [facility] CCPD 10 hours 5 exchanges 2 liter fills  Transition to  hemodialysis.   1. End Stage Renal Disease with hyperkalemia: transitioned from PD to hemodialysis.  - tolerated hemodialysis better yesterday.  We will plan for dialysis again tomorrow.  2.  Staphylococcus aureus bacteremia/Sepsis/ Left great toe osteomyelitis/endocarditis Temporary dialysis catheter placed on 10/18 after right permcath was removed  - Cefazolin for 6 weeks  3. Anemia of chronic kidney disease: hemoglobin down a bit 7.2.  Continue to monitor closely. - maintain the patient on Epogen for now.  Consider increase as an outpatient.  4. Secondary Hyperparathyroidism:  - recheck serum phosphorus tomorrow.s.  5. Hypertension:  - tolerated dialysis much better with increased dosage of midodrine.  6. Peripheral vascular disease: left first toe with gangrene and pain.  - underwent limb salvage angioplasty on 01/30/18 - Left great toe amputation 02/06/2018   LOS: 15 10/25/20193:58 PM Zonia Kief PA-C  Patient seen and examined with physician assistant. Agree with above plan.   Lavonia Dana, MD [facility]  10/25/20193:58 PM

## 2018-02-14 LAB — PHOSPHORUS: Phosphorus: 3.6 mg/dL (ref 2.5–4.6)

## 2018-02-14 LAB — GLUCOSE, CAPILLARY
Glucose-Capillary: 277 mg/dL — ABNORMAL HIGH (ref 70–99)
Glucose-Capillary: 210 mg/dL — ABNORMAL HIGH (ref 70–99)

## 2018-02-14 NOTE — Progress Notes (Signed)
[facility]  ROUNDING NOTE   Subjective:  Doing much better today. In good spirits. Due for dialysis today.  Objective:  Vital signs in last 24 hours:  Temp:  [98.3 F (36.8 C)-98.7 F (37.1 C)] 98.5 F (36.9 C) (10/26 1139) Pulse Rate:  [78-82] 79 (10/26 1139) Resp:  [16-20] 20 (10/26 0635) BP: (115-153)/(50-60) 153/60 (10/26 1139) SpO2:  [94 %-100 %] 100 % (10/26 1139)  Weight change:  Filed Weights   02/12/18 1411 02/12/18 1658 02/13/18 0500  Weight: 58.4 kg 58.4 kg 59.1 kg    Intake/Output: I/O last 3 completed shifts: In: 831 [P.O.:740; IV Piggyback:91] Out: 3 [Urine:3]   Intake/Output this shift:  No intake/output data recorded.  Physical Exam: General: NAD  Head: Normocephalic, atraumatic. Moist oral mucosal membranes  Eyes: Anicteric  Neck: Supple, trachea midline  Lungs:  Clear to auscultation  Heart: Regular rate and rhythm  Abdomen:  Soft, nontender  Extremities: no peripheral edema.  Neurologic: Nonfocal, moving all four extremities  Skin: No lesions  Access: PD catheter, left IJ tunneled catheter    Basic Metabolic Panel: Recent Labs  Lab 02/08/18 0439 02/11/18 0350 02/12/18 0801  NA 139 134* 137  K 4.3 5.3* 4.6  CL 96* 92* 92*  CO2 32 25 29  GLUCOSE 244* 208* 155*  BUN 38* 75* 39*  CREATININE 5.74* 9.55* 6.19*  CALCIUM 7.9* 8.0* 7.9*    Liver Function Tests: No results for input(s): AST, ALT, ALKPHOS, BILITOT, PROT, ALBUMIN in the last 168 hours. No results for input(s): LIPASE, AMYLASE in the last 168 hours. No results for input(s): AMMONIA in the last 168 hours.  CBC: Recent Labs  Lab 02/09/18 0803 02/10/18 0308 02/11/18 0350 02/11/18 1321 02/12/18 0801  WBC 23.8* 23.6* 20.3* 20.6* 17.2*  HGB 8.1* 7.9* 7.5* 7.2* 7.6*  HCT 27.1* 26.1* 26.0* 24.7* 26.4*  MCV 101.5* 101.2* 103.2* 103.3* 104.3*  PLT 249 276 284 295 307    Cardiac Enzymes: No results for input(s): CKTOTAL, CKMB, CKMBINDEX, TROPONINI in the  last 168 hours.  BNP: Invalid input(s): POCBNP  CBG: Recent Labs  Lab 02/12/18 2128 02/13/18 0739 02/13/18 1215 02/13/18 1645 02/14/18 1138  GLUCAP 252* 82 218* 159* 277*    Microbiology: Results for orders placed or performed during the hospital encounter of 01/29/18  Blood Culture (routine x 2)     Status: Abnormal   Collection Time: 01/29/18  9:55 PM  Result Value Ref Range Status   Specimen Description   Final    BLOOD RIGHT ANTECUBITAL Performed at [facility], [address] [place], [place] [address]    Special Requests   Final    BOTTLES DRAWN AEROBIC AND ANAEROBIC Blood Culture adequate volume Performed at [facility], [address] [place], [place] [address]    Culture  Setup Time   Final    GRAM POSITIVE COCCI IN CLUSTERS IN BOTH AEROBIC AND ANAEROBIC BOTTLES CRITICAL RESULT CALLED TO, READ BACK BY AND VERIFIED WITH: KAREN HAYES 01/30/18 0839 JGF Performed at [facility], [address] [place], [place] [address]    Culture (A)  Final    STAPHYLOCOCCUS AUREUS SUSCEPTIBILITIES PERFORMED ON PREVIOUS CULTURE WITHIN THE LAST 5 DAYS. Performed at [facility], [place] [address], [place], [place] [address]    Report Status 02/01/2018 FINAL  Final  Blood Culture (routine x 2)     Status: Abnormal   Collection Time: 01/29/18  9:55 PM  Result Value Ref Range Status  Specimen Description   Final    BLOOD RIGHT ANTECUBITAL Performed at [facility], [address] [place], [place] [address]    Special Requests   Final    BOTTLES DRAWN AEROBIC AND ANAEROBIC Blood Culture adequate volume Performed at [facility] [address] [place], [place] [address]    Culture  Setup Time   Final    GRAM POSITIVE COCCI IN CLUSTERS IN BOTH AEROBIC AND ANAEROBIC BOTTLES CRITICAL RESULT CALLED TO, READ BACK BY AND VERIFIED WITH: KAREN HAYES 01/30/18 0839 JGF Performed at [facility], [place] [address],  [place], [place] [address]    Culture STAPHYLOCOCCUS AUREUS (A)  Final   Report Status 02/01/2018 FINAL  Final   Organism ID, Bacteria STAPHYLOCOCCUS AUREUS  Final      Susceptibility   Staphylococcus aureus - MIC*    CIPROFLOXACIN <=0.5 SENSITIVE Sensitive     ERYTHROMYCIN <=0.25 SENSITIVE Sensitive     GENTAMICIN <=0.5 SENSITIVE Sensitive     OXACILLIN 0.5 SENSITIVE Sensitive     TETRACYCLINE <=1 SENSITIVE Sensitive     VANCOMYCIN <=0.5 SENSITIVE Sensitive     TRIMETH/SULFA <=10 SENSITIVE Sensitive     CLINDAMYCIN <=0.25 SENSITIVE Sensitive     RIFAMPIN <=0.5 SENSITIVE Sensitive     Inducible Clindamycin NEGATIVE Sensitive     * STAPHYLOCOCCUS AUREUS  Blood Culture ID Panel (Reflexed)     Status: Abnormal   Collection Time: 01/29/18  9:55 PM  Result Value Ref Range Status   Enterococcus species NOT DETECTED NOT DETECTED Final   Vancomycin resistance NOT DETECTED NOT DETECTED Final   Listeria monocytogenes NOT DETECTED NOT DETECTED Final   Staphylococcus species DETECTED (A) NOT DETECTED Final    Comment: CRITICAL RESULT CALLED TO, READ BACK BY AND VERIFIED WITH: KAREN HAYES 01/30/18 0839 JGF    Staphylococcus aureus (BCID) DETECTED (A) NOT DETECTED Final    Comment: CRITICAL RESULT CALLED TO, READ BACK BY AND VERIFIED WITH: KAREN HAYES 01/30/18 0839 JGF Methicillin (oxacillin) susceptible Staphylococcus aureus (MSSA). Preferred therapy is anti staphylococcal beta lactam antibiotic (Cefazolin or Nafcillin), unless clinically contraindicated.    Methicillin resistance NOT DETECTED NOT DETECTED Final   Streptococcus species NOT DETECTED NOT DETECTED Final   Streptococcus agalactiae NOT DETECTED NOT DETECTED Final   Streptococcus pneumoniae NOT DETECTED NOT DETECTED Final   Streptococcus pyogenes NOT DETECTED NOT DETECTED Final   Acinetobacter baumannii NOT DETECTED NOT DETECTED Final   Enterobacteriaceae species NOT DETECTED NOT DETECTED Final   Enterobacter cloacae complex NOT  DETECTED NOT DETECTED Final   Escherichia coli NOT DETECTED NOT DETECTED Final   Klebsiella oxytoca NOT DETECTED NOT DETECTED Final   Klebsiella pneumoniae NOT DETECTED NOT DETECTED Final   Proteus species NOT DETECTED NOT DETECTED Final   Serratia marcescens NOT DETECTED NOT DETECTED Final   Carbapenem resistance NOT DETECTED NOT DETECTED Final   Haemophilus influenzae NOT DETECTED NOT DETECTED Final   Neisseria meningitidis NOT DETECTED NOT DETECTED Final   Pseudomonas aeruginosa NOT DETECTED NOT DETECTED Final   Candida albicans NOT DETECTED NOT DETECTED Final   Candida glabrata NOT DETECTED NOT DETECTED Final   Candida krusei NOT DETECTED NOT DETECTED Final   Candida parapsilosis NOT DETECTED NOT DETECTED Final   Candida tropicalis NOT DETECTED NOT DETECTED Final    Comment: Performed at [facility], [address] [place],  [address]  MRSA PCR Screening     Status: None   Collection Time: 01/29/18 10:55 PM  Result Value  Ref Range Status   MRSA by PCR NEGATIVE NEGATIVE Final    Comment:        The GeneXpert MRSA Assay (FDA approved for NASAL specimens only), is one component of a comprehensive MRSA colonization surveillance program. It is not intended to diagnose MRSA infection nor to guide or monitor treatment for MRSA infections. Performed at [facility], [address] [place], [place] [address]   Culture, blood (Routine X 2) w Reflex to ID Panel     Status: None   Collection Time: 01/30/18  9:27 AM  Result Value Ref Range Status   Specimen Description BLOOD RIGHT ANTECUBITAL  Final   Special Requests   Final    BOTTLES DRAWN AEROBIC AND ANAEROBIC Blood Culture adequate volume   Culture   Final    NO GROWTH 5 DAYS Performed at [facility], [address] [place], [place] [address]    Report Status 02/04/2018 FINAL  Final  MRSA PCR Screening     Status: None   Collection Time: 02/05/18 11:23 PM  Result Value Ref Range  Status   MRSA by PCR NEGATIVE NEGATIVE Final    Comment:        The GeneXpert MRSA Assay (FDA approved for NASAL specimens only), is one component of a comprehensive MRSA colonization surveillance program. It is not intended to diagnose MRSA infection nor to guide or monitor treatment for MRSA infections. Performed at [facility], [address] [place], [place] [address]   Aerobic/Anaerobic Culture (surgical/deep wound)     Status: None   Collection Time: 02/06/18  9:11 AM  Result Value Ref Range Status   Specimen Description TOE GREAT LEFT  Final   Special Requests   Final    NONE Performed at [facility], [address], [place], [place] [address]    Gram Stain NO WBC SEEN NO ORGANISMS SEEN   Final   Culture   Final    RARE STAPHYLOCOCCUS AUREUS NO ANAEROBES ISOLATED Performed at [facility], [place] [address] [place], [place] [address]    Report Status 02/12/2018 FINAL  Final   Organism ID, Bacteria STAPHYLOCOCCUS AUREUS  Final      Susceptibility   Staphylococcus aureus - MIC*    CIPROFLOXACIN <=0.5 SENSITIVE Sensitive     ERYTHROMYCIN <=0.25 SENSITIVE Sensitive     GENTAMICIN <=0.5 SENSITIVE Sensitive     OXACILLIN <=0.25 SENSITIVE Sensitive     TETRACYCLINE <=1 SENSITIVE Sensitive     VANCOMYCIN <=0.5 SENSITIVE Sensitive     TRIMETH/SULFA <=10 SENSITIVE Sensitive     CLINDAMYCIN <=0.25 SENSITIVE Sensitive     RIFAMPIN <=0.5 SENSITIVE Sensitive     Inducible Clindamycin NEGATIVE Sensitive     * RARE STAPHYLOCOCCUS AUREUS  Culture, blood (Routine X 2) w Reflex to ID Panel     Status: None (Preliminary result)   Collection Time: 02/13/18  1:09 AM  Result Value Ref Range Status   Specimen Description BLOOD RIGHT ANTECUBITAL  Final   Special Requests   Final    BOTTLES DRAWN AEROBIC AND ANAEROBIC Blood Culture results may not be optimal due to an excessive volume of blood received in culture bottles   Culture   Final    NO GROWTH 1  DAY Performed at [facility], [address], [place], [place] [address]    Report Status PENDING  Incomplete  Culture, blood (Routine X 2) w Reflex to ID Panel     Status: None (  Preliminary result)   Collection Time: 02/13/18  1:09 AM  Result Value Ref Range Status   Specimen Description BLOOD BLOOD LEFT HAND  Final   Special Requests   Final    BOTTLES DRAWN AEROBIC AND ANAEROBIC Blood Culture results may not be optimal due to an excessive volume of blood received in culture bottles   Culture   Final    NO GROWTH 1 DAY Performed at [facility], [address] [place], [place] [address]    Report Status PENDING  Incomplete    Coagulation Studies: No results for input(s): LABPROT, INR in the last 72 hours.  Urinalysis: No results for input(s): COLORURINE, LABSPEC, PHURINE, GLUCOSEU, HGBUR, BILIRUBINUR, KETONESUR, PROTEINUR, UROBILINOGEN, NITRITE, LEUKOCYTESUR in the last 72 hours.  Invalid input(s): APPERANCEUR    Imaging: No results found.   Medications:   .  ceFAZolin (ANCEF) IV Stopped (02/13/18 2250)   . acetaminophen  650 mg Oral Q8H  . apixaban  2.5 mg Oral BID  . aspirin EC  81 mg Oral Daily  . atorvastatin  80 mg Oral QHS  . calcitRIOL  0.5 mcg Oral Q breakfast  . carvedilol  3.125 mg Oral BID WC  . Chlorhexidine Gluconate Cloth  6 each Topical Q0600  . cholecalciferol  400 Units Oral Once per day on Mon Wed Fri  . docusate sodium  100 mg Oral BID  . epoetin (EPOGEN/PROCRIT) injection  4,000 Units Intravenous Q M,W,F-HD  . famotidine  20 mg Oral Daily  . feeding supplement (NEPRO CARB STEADY)  237 mL Oral BID BM  .  HYDROmorphone (DILAUDID) injection  0.5 mg Intravenous Once  . insulin aspart  0-5 Units Subcutaneous QHS  . insulin aspart  0-9 Units Subcutaneous TID WC  . isosorbide mononitrate  30 mg Oral Daily  . LORazepam  0.5 mg Intravenous Once  . midodrine  10 mg Oral Once per day on Tue Thu Sat  . mirtazapine  7.5 mg Oral QHS   . multivitamin  1 tablet Oral QHS  . multivitamin with minerals  1 tablet Oral Daily  . multivitamin-lutein  1 capsule Oral Daily  . sevelamer carbonate  1,600 mg Oral TID WC   acetaminophen **OR** acetaminophen, bisacodyl, HYDROmorphone (DILAUDID) injection, metoCLOPramide (REGLAN) injection, nitroGLYCERIN, ondansetron **OR** ondansetron (ZOFRAN) IV, oxyCODONE, traZODone  Assessment/ Plan:  [name] is a 67 y.o. african [place] female with end stage renal disease on peritoneal dialysis, hypertension, congestive heart failure, coronary artery disease status post CABG, diabetes mellitus type II insulin dependent, GERD, peripheral vascular disease admitted to [facility] on 01/29/2018 for leg pain.    [facility] CCPD 10 hours 5 exchanges 2 liter fills  Transition to hemodialysis.   1. End Stage Renal Disease with hyperkalemia: transitioned from PD to hemodialysis.  -Patient due for hemodialysis today.  She tolerated dialysis treatment well on Thursday.  Continue midodrine to help her tolerate dialysis treatment.  PD catheter to be removed as an outpatient.  2.  Staphylococcus aureus bacteremia/Sepsis/ Left great toe osteomyelitis/endocarditis Temporary dialysis catheter placed on 10/18 after right permcath was removed  - Cefazolin for 6 weeks  3. Anemia of chronic kidney disease:  Hemoglobin up to 7.6.  Patient to be maintained on Mircera as an outpatient.  4. Secondary Hyperparathyroidism:  -Check serum phosphorus today.  5. Hypertension:  -Maintain the patient on midodrine to help maintain blood pressure during dialysis treatment.  6. Peripheral vascular disease: left first toe with gangrene and  pain.  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 Agree with above plan.   Stephanie [place],  [facility]  10/26/201912:01 PM

## 2018-02-14 NOTE — Progress Notes (Signed)
HD tx end    02/14/18 1718  Vital Signs  Pulse Rate 74  Pulse Rate Source Monitor  Resp 13  BP (!) 141/62  BP Location Right Arm  BP Method Automatic  Patient Position (if appropriate) Sitting  Oxygen Therapy  SpO2 100 %  O2 Device Room Air  During Hemodialysis Assessment  Dialysis Fluid Bolus Normal Saline  Bolus Amount (mL) 250 mL  Intra-Hemodialysis Comments Tx completed

## 2018-02-14 NOTE — Progress Notes (Signed)
Post HD assessment. Pt tolerated tx well without c/o or complication. Net UF 1010, goal met. Pt in chair for HD tx.    02/14/18 1725  Vital Signs  Temp 98.8 F (37.1 C)  Temp Source Oral  Pulse Rate 80  Pulse Rate Source Monitor  Resp (!) 23  BP (!) 145/53  BP Location Right Arm  BP Method Automatic  Patient Position (if appropriate) Sitting  Oxygen Therapy  SpO2 94 %  O2 Device Room Air  Dialysis Weight  Weight 58.7 kg  Type of Weight Post-Dialysis  Post-Hemodialysis Assessment  Rinseback Volume (mL) 250 mL  KECN 72 V  Dialyzer Clearance Lightly streaked  Duration of HD Treatment -hour(s) 3.5 hour(s)  Hemodialysis Intake (mL) 500 mL  UF Total -Machine (mL) 1510 mL  Net UF (mL) 1010 mL  Tolerated HD Treatment Yes  Education / Care Plan  Dialysis Education Provided Yes  Documented Education in Care Plan Yes  Hemodialysis Catheter Left Subclavian Double-lumen  Placement Date: 02/11/18   Placed prior to admission: No  Orientation: Left  Access Location: Subclavian  Hemodialysis Catheter Type: Double-lumen  Site Condition No complications  Blue Lumen Status Heparin locked  Red Lumen Status Heparin locked  Purple Lumen Status N/A  Catheter fill solution Heparin 1000 units/ml  Catheter fill volume (Arterial) 1.7 cc  Catheter fill volume (Venous) 1.7  Dressing Type Biopatch  Dressing Status Clean;Dry;Intact  Drainage Description None  Dressing Change Due 02/19/18  Post treatment catheter status Capped and Clamped

## 2018-02-14 NOTE — Progress Notes (Signed)
   02/14/18 1000  Clinical Encounter Type  Visited With Patient  Visit Type Follow-up;Spiritual support  Recommendations Follow-up, as requested.  Spiritual Encounters  Spiritual Needs Emotional   Patient is much-improved since previous visit. Patient believes that her recovery is God's will and that God is not done with her yet. Her pastor has helped her to discern this reasoning. Her recovery is part of her faith testimony. Chaplain provided active listening, emotional support, and affirmations. Chaplain concluded the visit with a blessing.

## 2018-02-14 NOTE — Progress Notes (Signed)
Pre HD assessment    02/14/18 1337  Neurological  Level of Consciousness Alert  Orientation Level Oriented X4  Respiratory  Respiratory Pattern Regular;Unlabored  Chest Assessment Chest expansion symmetrical  Cardiac  ECG Monitor Yes  Cardiac Rhythm NSR  Vascular  R Radial Pulse +2  L Radial Pulse +2  Edema Left lower extremity  Integumentary  Integumentary (WDL) X  Skin Color Appropriate for ethnicity  Musculoskeletal  Musculoskeletal (WDL) X  Generalized Weakness Yes  Assistive Device None  GU Assessment  Genitourinary (WDL) X  Genitourinary Symptoms  (HD)  Psychosocial  Psychosocial (WDL) WDL

## 2018-02-14 NOTE — Progress Notes (Signed)
Pre HD Assessment   02/14/18 1336  Vital Signs  Temp 98.6 F (37 C)  Temp Source Oral  Pulse Rate 77  Pulse Rate Source Monitor  Resp 14  BP (!) 158/61  BP Location Right Arm  BP Method Automatic  Patient Position (if appropriate) Sitting  Oxygen Therapy  SpO2 96 %  O2 Device Room Air  Pain Assessment  Pain Scale 0-10  Pain Score 0  Dialysis Weight  Weight 59 kg  Type of Weight Pre-Dialysis  Time-Out for Hemodialysis  What Procedure? HD  Pt Identifiers(min of two) First/Last Name;MRN/Account#  Correct Site? Yes  Correct Side? Yes  Correct Procedure? Yes  Consents Verified? Yes  Rad Studies Available? N/A  Safety Precautions Reviewed? Yes  Engineer, civil (consulting) Number  (2A)  Station Number 2  UF/Alarm Test Passed  Conductivity: Meter 13.8  Conductivity: Machine  14  pH 7.6  Reverse Osmosis main  Normal Saline Lot Number [id number]  Dialyzer Lot Number [id number]  Disposable Set Lot Number [id number]  Machine Temperature 98.6 F (37 C)  Musician and Audible Yes  Blood Lines Intact and Secured Yes  Pre Treatment Patient Checks  Vascular access used during treatment Catheter  Patient is receiving dialysis in a chair Yes  Hepatitis B Surface Antigen Results Negative  Date Hepatitis B Surface Antigen Drawn 11/26/17  Hepatitis B Surface Antibody  (>10)  Date Hepatitis B Surface Antibody Drawn 11/26/17  Hemodialysis Consent Verified Yes  Hemodialysis Standing Orders Initiated Yes  ECG (Telemetry) Monitor On Yes  Prime Ordered Normal Saline  Length of  DialysisTreatment -hour(s) 3.5 Hour(s)  Dialyzer Elisio 17H NR  Dialysate 2K, 2.5 Ca  Dialysis Anticoagulant None  Dialysate Flow Ordered 600  Blood Flow Rate Ordered 300 mL/min  Ultrafiltration Goal 1 Liters  Pre Treatment Labs Phosphorus  Dialysis Blood Pressure Support Ordered Normal Saline  Education / Care Plan  Dialysis Education Provided Yes  Documented Education in Care Plan Yes  Hemodialysis  Catheter Left Subclavian Double-lumen  Placement Date: 02/11/18   Placed prior to admission: No  Orientation: Left  Access Location: Subclavian  Hemodialysis Catheter Type: Double-lumen  Site Condition No complications  Blue Lumen Status Heparin locked  Red Lumen Status Heparin locked  Purple Lumen Status N/A  Dressing Type Biopatch  Dressing Status Clean;Dry;Intact  Drainage Description None  Dressing Change Due 02/19/18

## 2018-02-14 NOTE — Progress Notes (Signed)
Post HD assessment    02/14/18 1723  Neurological  Level of Consciousness Alert  Orientation Level Oriented X4  Respiratory  Respiratory Pattern Regular;Unlabored  Chest Assessment Chest expansion symmetrical  Cardiac  ECG Monitor Yes  Cardiac Rhythm NSR  Vascular  R Radial Pulse +2  L Radial Pulse +2  Edema Left lower extremity  Integumentary  Integumentary (WDL) X  Skin Color Appropriate for ethnicity  Musculoskeletal  Musculoskeletal (WDL) X  Generalized Weakness Yes  Assistive Device None  GU Assessment  Genitourinary (WDL) X  Genitourinary Symptoms  (HD)  Psychosocial  Psychosocial (WDL) WDL

## 2018-02-14 NOTE — Progress Notes (Signed)
HD tx start    02/14/18 1343  Vital Signs  Pulse Rate 76  Pulse Rate Source Monitor  Resp 18  BP (!) 154/56  BP Location Right Arm  BP Method Automatic  Patient Position (if appropriate) Lying  Oxygen Therapy  SpO2 94 %  O2 Device Room Air  During Hemodialysis Assessment  Blood Flow Rate (mL/min) 300 mL/min  Arterial Pressure (mmHg) -130 mmHg  Venous Pressure (mmHg) 90 mmHg  Transmembrane Pressure (mmHg) 50 mmHg  Ultrafiltration Rate (mL/min) 420 mL/min  Dialysate Flow Rate (mL/min) 600 ml/min  Conductivity: Machine  13.9  HD Safety Checks Performed Yes  Dialysis Fluid Bolus Normal Saline  Bolus Amount (mL) 250 mL  Intra-Hemodialysis Comments Tx initiated  Hemodialysis Catheter Left Subclavian Double-lumen  Placement Date: 02/11/18   Placed prior to admission: No  Orientation: Left  Access Location: Subclavian  Hemodialysis Catheter Type: Double-lumen  Blue Lumen Status Infusing  Red Lumen Status Infusing

## 2018-02-14 NOTE — Progress Notes (Signed)
[facility] at [facility]                                                                                                                                                                                  Patient Demographics   Stephanie Ellis, is a 67 y.o. female, DOB - [birth year], TIR:[id number]  Admit date - 01/29/2018   Admitting Physician Amelia Jo, MD  Outpatient Primary MD for the patient is System, Pcp Not In   LOS - 16  Subjective:  Seen during dialysis, no complaints.  Review of Systems:   CONSTITUTIONAL: No documented fever. No fatigue, weakness. No weight gain, no weight loss.  EYES: No blurry or double vision.  ENT: No tinnitus. No postnasal drip. No redness of the oropharynx.  RESPIRATORY: No cough, no wheeze, no hemoptysis. No dyspnea. No hemoptysis and epistaxis. CARDIOVASCULAR: No chest pain. No orthopnea. No palpitations. No syncope.  GASTROINTESTINAL: Had nausea and vomiting, no abdominal pain.   GENITOURINARY: No dysuria or hematuria.  ENDOCRINE: No polyuria or nocturia. No heat or cold intolerance.  HEMATOLOGY: No anemia. No bruising. No bleeding.  INTEGUMENTARY: No rashes. No lesions.  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 NEUROLOGIC: No numbness, tingling, or ataxia. No seizure-type activity.  PSYCHIATRIC: No anxiety. No insomnia. No ADD.    Vitals:   Vitals:   02/14/18 1530 02/14/18 1545 02/14/18 1600 02/14/18 1615  BP: (!) 138/56 (!) 153/55 (!) 150/56 (!) 149/54  Pulse: 76 73 73 73  Resp: 15 (!) 28 13 19   Temp:      TempSrc:      SpO2: 100% 98% 100% 99%  Weight:      Height:        Wt Readings from Last 3 Encounters:  02/14/18 59 kg  10/15/17 58.1 kg  10/09/17 58.1 kg     Intake/Output Summary (Last 24 hours) at 02/14/2018 1623 Last data filed at 02/14/2018 1200 Gross per 24 hour  Intake 51.58 ml  Output 0 ml  Net 51.58 ml    Physical Exam:   GENERAL: Pleasant-appearing in no apparent  distress.  HEAD, EYES, EARS, NOSE AND THROAT: Atraumatic, normocephalic. Extraocular muscles are intact. Pupils equal and reactive to light. Sclerae anicteric. No conjunctival injection. No oro-pharyngeal erythema.  NECK: Supple. There is no jugular venous distention. No bruits, no lymphadenopathy, no thyromegaly.  HEART: Regular rate and rhythm,. No murmurs, no rubs, no clicks.  LUNGS: Clear to auscultation bilaterally. No rales or rhonchi. No wheezes.  ABDOMEN: Soft, flat, nontender, nondistended. Has good bowel sounds. No hepatosplenomegaly appreciated.  EXTREMITIES: Status left great toe amputation in dressing. NEUROLOGIC: The patient is alert, awake, and oriented  x3 with no focal motor or sensory deficits appreciated bilaterally.  SKIN: Moist and warm with no rashes appreciated.  Psych: Not anxious, depressed LN: No inguinal LN enlargement    Antibiotics   Anti-infectives (From admission, onward)   Start     Dose/Rate Route Frequency Ordered Stop   02/11/18 0921  ceFAZolin (ANCEF) 1-4 GM/50ML-% IVPB    Note to Pharmacy:  Carlynn Spry   : cabinet override      02/11/18 0921 02/11/18 2129   02/11/18 0000  ceFAZolin (ANCEF) IVPB 1 g/50 mL premix    Note to Pharmacy:  Send with pt to OR   1 g 100 mL/hr over 30 Minutes Intravenous On call 02/10/18 2136 02/12/18 0000   02/06/18 0832  ceFAZolin (ANCEF) 1-4 GM/50ML-% IVPB    Note to Pharmacy:  Norton Blizzard  : cabinet override      02/06/18 0832 02/06/18 0855   02/06/18 0831  vancomycin (VANCOCIN) 1-5 GM/200ML-% IVPB  Status:  Discontinued    Note to Pharmacy:  Norton Blizzard  : cabinet override      02/06/18 0831 02/06/18 0846   01/31/18 2200  ceFAZolin (ANCEF) IVPB 1 g/50 mL premix     1 g 100 mL/hr over 30 Minutes Intravenous Every 24 hours 01/31/18 1409     01/30/18 1800  vancomycin (VANCOCIN) IVPB 1000 mg/200 mL premix  Status:  Discontinued     1,000 mg 200 mL/hr over 60 Minutes Intravenous Every M-W-F  (Hemodialysis) 01/30/18 0435 01/30/18 0906   01/30/18 1000  piperacillin-tazobactam (ZOSYN) IVPB 3.375 g  Status:  Discontinued     3.375 g 12.5 mL/hr over 240 Minutes Intravenous Every 12 hours 01/30/18 0435 01/30/18 0906   01/30/18 1000  ceFAZolin (ANCEF) 500 mg in dextrose 5 % 100 mL IVPB  Status:  Discontinued     500 mg 210 mL/hr over 30 Minutes Intravenous Every 12 hours 01/30/18 0906 01/31/18 1409   01/29/18 2200  piperacillin-tazobactam (ZOSYN) IVPB 3.375 g     3.375 g 100 mL/hr over 30 Minutes Intravenous  Once 01/29/18 2146 01/29/18 2242   01/29/18 2200  vancomycin (VANCOCIN) IVPB 1000 mg/200 mL premix     1,000 mg 200 mL/hr over 60 Minutes Intravenous  Once 01/29/18 2146 01/29/18 2314      Medications   Scheduled Meds: . acetaminophen  650 mg Oral Q8H  . apixaban  2.5 mg Oral BID  . aspirin EC  81 mg Oral Daily  . atorvastatin  80 mg Oral QHS  . calcitRIOL  0.5 mcg Oral Q breakfast  . carvedilol  3.125 mg Oral BID WC  . Chlorhexidine Gluconate Cloth  6 each Topical Q0600  . cholecalciferol  400 Units Oral Once per day on Mon Wed Fri  . docusate sodium  100 mg Oral BID  . epoetin (EPOGEN/PROCRIT) injection  4,000 Units Intravenous Q M,W,F-HD  . famotidine  20 mg Oral Daily  . feeding supplement (NEPRO CARB STEADY)  237 mL Oral BID BM  .  HYDROmorphone (DILAUDID) injection  0.5 mg Intravenous Once  . insulin aspart  0-5 Units Subcutaneous QHS  . insulin aspart  0-9 Units Subcutaneous TID WC  . isosorbide mononitrate  30 mg Oral Daily  . LORazepam  0.5 mg Intravenous Once  . midodrine  10 mg Oral Once per day on Tue Thu Sat  . mirtazapine  7.5 mg Oral QHS  . multivitamin  1 tablet Oral QHS  . multivitamin with minerals  1 tablet Oral  Daily  . multivitamin-lutein  1 capsule Oral Daily  . sevelamer carbonate  1,600 mg Oral TID WC   Continuous Infusions: .  ceFAZolin (ANCEF) IV Stopped (02/13/18 2250)   PRN Meds:.acetaminophen **OR** acetaminophen, bisacodyl,  HYDROmorphone (DILAUDID) injection, metoCLOPramide (REGLAN) injection, nitroGLYCERIN, ondansetron **OR** ondansetron (ZOFRAN) IV, oxyCODONE, traZODone   Data Review:   Micro Results Recent Results (from the past 240 hour(s))  MRSA PCR Screening     Status: None   Collection Time: 02/05/18 11:23 PM  Result Value Ref Range Status   MRSA by PCR NEGATIVE NEGATIVE Final    Comment:        The GeneXpert MRSA Assay (FDA approved for NASAL specimens only), is one component of a comprehensive MRSA colonization surveillance program. It is not intended to diagnose MRSA infection nor to guide or monitor treatment for MRSA infections. Performed at [facility], [address] [place], [place] [address]   Aerobic/Anaerobic Culture (surgical/deep wound)     Status: None   Collection Time: 02/06/18  9:11 AM  Result Value Ref Range Status   Specimen Description TOE GREAT LEFT  Final   Special Requests   Final    NONE Performed at [facility], [address], [place], [place] [address]    Gram Stain NO WBC SEEN NO ORGANISMS SEEN   Final   Culture   Final    RARE STAPHYLOCOCCUS AUREUS NO ANAEROBES ISOLATED Performed at [facility], [place] [address], [place], [place] [address]    Report Status 02/12/2018 FINAL  Final   Organism ID, Bacteria STAPHYLOCOCCUS AUREUS  Final      Susceptibility   Staphylococcus aureus - MIC*    CIPROFLOXACIN <=0.5 SENSITIVE Sensitive     ERYTHROMYCIN <=0.25 SENSITIVE Sensitive     GENTAMICIN <=0.5 SENSITIVE Sensitive     OXACILLIN <=0.25 SENSITIVE Sensitive     TETRACYCLINE <=1 SENSITIVE Sensitive     VANCOMYCIN <=0.5 SENSITIVE Sensitive     TRIMETH/SULFA <=10 SENSITIVE Sensitive     CLINDAMYCIN <=0.25 SENSITIVE Sensitive     RIFAMPIN <=0.5 SENSITIVE Sensitive     Inducible Clindamycin NEGATIVE Sensitive     * RARE STAPHYLOCOCCUS AUREUS  Culture, blood (Routine X 2) w Reflex to ID Panel     Status: None (Preliminary result)    Collection Time: 02/13/18  1:09 AM  Result Value Ref Range Status   Specimen Description BLOOD RIGHT ANTECUBITAL  Final   Special Requests   Final    BOTTLES DRAWN AEROBIC AND ANAEROBIC Blood Culture results may not be optimal due to an excessive volume of blood received in culture bottles   Culture   Final    NO GROWTH 1 DAY Performed at [facility], [address], [place], [place] [address]    Report Status PENDING  Incomplete  Culture, blood (Routine X 2) w Reflex to ID Panel     Status: None (Preliminary result)   Collection Time: 02/13/18  1:09 AM  Result Value Ref Range Status   Specimen Description BLOOD BLOOD LEFT HAND  Final   Special Requests   Final    BOTTLES DRAWN AEROBIC AND ANAEROBIC Blood Culture results may not be optimal due to an excessive volume of blood received in culture bottles   Culture   Final    NO GROWTH 1 DAY Performed at [facility], [address] [place], [place] [address]    Report Status PENDING  Incomplete    Radiology Reports Dg  Tibia/fibula Left  Result Date: 01/29/2018 CLINICAL DATA:  Presents with ED via ems from home Having pain to both lower legs But states left leg is worse Increased pain with palpation Decreased pulses to foot EXAM: LEFT TIBIA AND FIBULA - 2 VIEW COMPARISON:  None. FINDINGS: No fracture.  No bone lesion. Knee and ankle joints are normally aligned. There are arterial vascular calcifications. Soft tissues are otherwise unremarkable. IMPRESSION: Negative Electronically Signed   By: Lajean Manes M.D.   On: 01/29/2018 20:58   Ct Chest W Contrast  Result Date: 02/12/2018 CLINICAL DATA:  Recent placement of PermCath with an episode of hemoptysis. Patient also presenting with lethargy and signs of sepsis. EXAM: CT CHEST WITH CONTRAST TECHNIQUE: Multidetector CT imaging of the chest was performed during intravenous contrast administration. CONTRAST:  23m OMNIPAQUE IOHEXOL 300 MG/ML  SOLN COMPARISON:  Chest  radiograph 02/11/2018 FINDINGS: Cardiovascular: Dual lumen central venous catheter from left IJ approach terminates in the right atrium. There is a small amount of subcutaneous emphysema adjacent to the skin entrance site. No perivascular hematoma is seen along the catheter course. Enlarged cardiac silhouette. Heavy calcific atherosclerotic disease of the coronary arteries and aorta. Post CABG postsurgical changes. Mediastinum/Nodes: No evidence of lymphadenopathy. 12 mm circumscribed hypoattenuated left thyroid nodule. Lungs/Pleura: No evidence of pneumothorax or pleural effusion. Linear airspace opacities in bilateral lower lungs may represent atelectasis versus scarring. Punctate calcifications are noted along bilateral pleural surfaces. Upper Abdomen: Scattered calcifications throughout the spleen. Punctate vascular calcifications versus small nonobstructive calculi in bilateral kidneys. Musculoskeletal: No chest wall abnormality. No acute or significant osseous findings. IMPRESSION: Dual lumen left IJ approach central venous catheter terminates in the right atrium. Small amount of subcutaneous emphysema adjacent to the skin entrance site, expected postprocedural finding. No evidence of perivascular hematoma. No evidence of pneumothorax. Bilateral lower lobes pulmonary scarring versus atelectasis. Scattered calcifications along the pleural surfaces in bilateral lower lobes may represent evidence of prior granulomatous disease or asbestos exposure. Heavy calcific atherosclerotic disease of the aorta and coronary arteries. Postsurgical changes of CABG. Cardiomegaly. 12 mm left thyroid nodule. If further imaging is desired, dedicated thyroid ultrasound may be considered. Aortic Atherosclerosis (ICD10-I70.0). Electronically Signed   By: DFidela SalisburyM.D.   On: 02/12/2018 11:13   UKoreaVenous Img Lower Unilateral Left  Result Date: 01/29/2018 CLINICAL DATA:  Left leg pain EXAM: LEFT LOWER EXTREMITY VENOUS  DOPPLER ULTRASOUND TECHNIQUE: Gray-scale sonography with graded compression, as well as color Doppler and duplex ultrasound were performed to evaluate the lower extremity deep venous systems from the level of the common femoral vein and including the common femoral, femoral, profunda femoral, popliteal and calf veins including the posterior tibial, peroneal and gastrocnemius veins when visible. The superficial great saphenous vein was also interrogated. Spectral Doppler was utilized to evaluate flow at rest and with distal augmentation maneuvers in the common femoral, femoral and popliteal veins. COMPARISON:  None. FINDINGS: Contralateral Common Femoral Vein: Respiratory phasicity is normal and symmetric with the symptomatic side. No evidence of thrombus. Normal compressibility. Common Femoral Vein: No evidence of thrombus. Normal compressibility, respiratory phasicity and response to augmentation. Saphenofemoral Junction: No evidence of thrombus. Normal compressibility and flow on color Doppler imaging. Profunda Femoral Vein: No evidence of thrombus. Normal compressibility and flow on color Doppler imaging. Femoral Vein: No evidence of thrombus. Normal compressibility, respiratory phasicity and response to augmentation. Popliteal Vein: No evidence of thrombus. Normal compressibility, respiratory phasicity and response to augmentation. Calf Veins: No evidence of thrombus. Normal  compressibility and flow on color Doppler imaging. Superficial Great Saphenous Vein: No evidence of thrombus. Normal compressibility. Venous Reflux:  None. Other Findings:  None. IMPRESSION: No evidence of deep venous thrombosis. Electronically Signed   By: Rolm Baptise M.D.   On: 01/29/2018 18:36   Dg Chest Port 1 View  Result Date: 02/11/2018 CLINICAL DATA:  Hemoptysis EXAM: PORTABLE CHEST 1 VIEW COMPARISON:  10/15/2017 FINDINGS: Left IJ dialysis catheter tips proximal mid right atrium. Stable cardiomegaly without CHF. Chronic left  basilar scarring as before with blunting of left costophrenic angle. No definite superimposed acute process or CHF. No pneumothorax. Trachea is midline. Previous median sternotomy noted. Aorta atherosclerotic. Degenerative changes noted of the spine. IMPRESSION: Stable cardiomegaly and left basilar scarring. No interval change or superimposed acute process. Electronically Signed   By: Jerilynn Mages.  Shick M.D.   On: 02/11/2018 17:57   Dg Toe Great Left  Result Date: 01/29/2018 CLINICAL DATA:  Open wound to the left great toe for several months. EXAM: LEFT GREAT TOE COMPARISON:  10/07/2017 FINDINGS: Degenerative changes in the first metatarsal-phalangeal joint. Soft tissue defect over the plantar aspect of the left first toe. There is underlying erosion of the distal phalangeal tuft cortex suggesting focal osteomyelitis. No evidence of acute fracture or dislocation. Diffuse bone demineralization. Prominent vascular calcifications. IMPRESSION: Soft tissue defect over the plantar aspect of the left first toe with underlying erosion of the distal phalangeal tuft suggesting osteomyelitis. Electronically Signed   By: Lucienne Capers M.D.   On: 01/29/2018 21:30     CBC Recent Labs  Lab 02/09/18 0803 02/10/18 0308 02/11/18 0350 02/11/18 1321 02/12/18 0801  WBC 23.8* 23.6* 20.3* 20.6* 17.2*  HGB 8.1* 7.9* 7.5* 7.2* 7.6*  HCT 27.1* 26.1* 26.0* 24.7* 26.4*  PLT 249 276 284 295 307  MCV 101.5* 101.2* 103.2* 103.3* 104.3*  MCH 30.3 30.6 29.8 30.1 30.0  MCHC 29.9* 30.3 28.8* 29.1* 28.8*  RDW 21.4* 21.8* 21.9* 22.2* 22.3*    Chemistries  Recent Labs  Lab 02/08/18 0439 02/11/18 0350 02/12/18 0801  NA 139 134* 137  K 4.3 5.3* 4.6  CL 96* 92* 92*  CO2 32 25 29  GLUCOSE 244* 208* 155*  BUN 38* 75* 39*  CREATININE 5.74* 9.55* 6.19*  CALCIUM 7.9* 8.0* 7.9*   ------------------------------------------------------------------------------------------------------------------ estimated creatinine clearance is  7.7 mL/min (A) (by C-G formula based on SCr of 6.19 mg/dL (H)). ------------------------------------------------------------------------------------------------------------------ No results for input(s): HGBA1C in the last 72 hours. ------------------------------------------------------------------------------------------------------------------ No results for input(s): CHOL, HDL, LDLCALC, TRIG, CHOLHDL, LDLDIRECT in the last 72 hours. ------------------------------------------------------------------------------------------------------------------ No results for input(s): TSH, T4TOTAL, T3FREE, THYROIDAB in the last 72 hours.  Invalid input(s): FREET3 ------------------------------------------------------------------------------------------------------------------ No results for input(s): VITAMINB12, FOLATE, FERRITIN, TIBC, IRON, RETICCTPCT in the last 72 hours.  Coagulation profile No results for input(s): INR, PROTIME in the last 168 hours.  No results for input(s): DDIMER in the last 72 hours.  Cardiac Enzymes No results for input(s): CKMB, TROPONINI, MYOGLOBIN in the last 168 hours.  Invalid input(s): CK ------------------------------------------------------------------------------------------------------------------ Invalid input(s): [place]  Patient admitted with sepsis   1.  Sepsis,  with MSSA with left great toe osteomyelitis, Tricuspid endocarditis.   Status post removal of her permacath by vascular and temporary dialysis catheter. Repeat blood cultures from 10/11 has been negative so far.  Better lleukocytosis. Per Dr. Steva Ready, will need 6 weeks of IV Cefazolin which can be given during dialysis so she does not need another catheter  leukocytosis is  improving. 2 grams, 2grams and 3 grams until 03/13/18 with weekly CBC/CMP.  Acute ischemia of the left foot status post angioplasty of left popliteal by vascular , heparin drip was discontinued due to  epistaxis.  Resumed Heparin drip.  But the patient had hemoptysis and epistaxis.  Discontinued heparin drip.  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 Dressing changed 3 x's per week. F/u with podiatry in 2 weeks per Dr. Vickki Muff.  2.    Acute encephalopathy due to #1 now improved.   3.  PAD.,  Left leg ischemia, status post hospitalization, continue aspirin and statins. Heparin drip is restarted, which is discontinued due to hemoptysis and epistaxis.   May start low dose Eliquis after permacath per Dr. Lucky Cowboy. Started Eliquis.   4.  End-stage renal disease on peritoneal dialysis.     Patient does not want peritoneal dialysis anymore. S/P right temp HD catheter. S/P permacath placement and continue hemodialysis now per Dr. Holley Raring.   5.  Diabetes type 2.  controlled, continue insulin with sliding scale coverage. Continue carb controlled renal diet.  6.  Chronic diastolic CHF, LV EF: 95% -   55%,  currently clinically compensated, continue maintenance therapy.  Continue Coreg, Imdur, midodrine.  7.  Peripheral neuropathy, continue pain medication.  #8. nausea likely secondary to sepsis/narcotic induced: And possible diabetic gastroparesis: continue Reglan prn, PPI, improved.  #.9. Hyperkalemia.  Discontinued ibesartan, improved with hemodialysis.  Hemoptysis and epistaxis.  Discontinued heparin drip, chest x-ray and CT are unremarkable.  Improved.  Anemia of chronic disease.  Hemoglobin decreased to 7.2.  Follow-up hemoglobin 7.6, on Epo during dialysis PRBC transfusion PRN.  Outpatient dialysis coordinator stated that patient was not able to start outpatient dialysis until Tuesday and will need to remain in hospital in order to receive dialysis treatments.  I discussed with Dr. Holley Raring PT suggest SNF.    Code Status Orders  (From admission, onward)         Start     Ordered   01/29/18 2250  Full code  Continuous     01/29/18 2250        Code Status  History    Date Active Date Inactive Code Status Order ID Comments User Context   10/24/2016 1055 10/25/2016 2243 Full Code [id number]  Hillary Bow, MD ED   10/17/2016 2143 10/19/2016 1440 Full Code [id number]  Vaughan Basta, MD Inpatient   09/04/2016 1708 09/05/2016 1500 Full Code [id number]  Epifanio Lesches, MD ED   08/22/2016 0938 08/22/2016 1833 Full Code [id number]  Algernon Huxley, MD Inpatient   06/27/2016 1121 06/27/2016 1846 Full Code [id number]  Algernon Huxley, MD Inpatient   10/03/2015 2235 10/04/2015 1724 Full Code [id number]  Harrie Foreman, MD Inpatient        Consults  NEPHROLOGY AND VASCULAR SURGERY   HEPARIN  Lab Results  Component Value Date   PLT 307 02/12/2018    Time Spent in minutes 25 MIN   Greater than 50% of time spent in care coordination and counseling patient regarding the condition and plan of care.  Vaughan Basta M.D on 02/14/2018 at 4:23 PM  Between 7am to 6pm - Pager - [phone]  After 6pm go to [email or web address] - Proofreader  [employer]   Office  [phone]

## 2018-02-15 LAB — CREATININE, SERUM
Creatinine, Ser: 4.07 mg/dL — ABNORMAL HIGH (ref 0.44–1.00)
GFR calc Af Amer: 12 mL/min — ABNORMAL LOW (ref >60)
GFR calc non Af Amer: 11 mL/min — ABNORMAL LOW (ref >60)

## 2018-02-15 LAB — GLUCOSE, CAPILLARY
Glucose-Capillary: 125 mg/dL — ABNORMAL HIGH (ref 70–99)
Glucose-Capillary: 202 mg/dL — ABNORMAL HIGH (ref 70–99)
Glucose-Capillary: 234 mg/dL — ABNORMAL HIGH (ref 70–99)

## 2018-02-15 LAB — CBC
HCT: 23.7 % — ABNORMAL LOW (ref 36.0–46.0)
Hemoglobin: 6.5 g/dL — ABNORMAL LOW (ref 12.0–15.0)
MCH: 29.8 pg (ref 26.0–34.0)
MCHC: 27.4 g/dL — ABNORMAL LOW (ref 30.0–36.0)
MCV: 108.7 fL — ABNORMAL HIGH (ref 80.0–100.0)
Platelets: 347 10*3/uL (ref 150–400)
RBC: 2.18 MIL/uL — ABNORMAL LOW (ref 3.87–5.11)
RDW: 22.5 % — ABNORMAL HIGH (ref 11.5–15.5)
WBC: 19.1 10*3/uL — ABNORMAL HIGH (ref 4.0–10.5)
nRBC: 0.2 % (ref 0.0–0.2)

## 2018-02-15 LAB — PREPARE RBC (CROSSMATCH)

## 2018-02-15 MED ORDER — SODIUM CHLORIDE 0.9% IV SOLUTION
Freq: Once | INTRAVENOUS | Status: AC
  Administered 2018-02-15: 16:00:00 via INTRAVENOUS

## 2018-02-15 MED ORDER — MIDODRINE HCL 10 MG PO TABS: 10.0000 mg | ORAL_TABLET | ORAL | 0 refills | Status: DC

## 2018-02-15 MED ORDER — VITAMIN B-12 1000 MCG PO TABS: 1000.0000 ug | ORAL_TABLET | Freq: Every day | ORAL | 0 refills | Status: AC

## 2018-02-15 MED ORDER — INSULIN GLARGINE 100 UNITS/ML SOLOSTAR PEN: 8.0000 [IU] | PEN_INJECTOR | Freq: Every day | SUBCUTANEOUS | 11 refills | Status: DC

## 2018-02-15 MED ORDER — APIXABAN 2.5 MG PO TABS: 2.5000 mg | ORAL_TABLET | Freq: Two times a day (BID) | ORAL | 0 refills | Status: AC

## 2018-02-15 MED ORDER — CEFAZOLIN IV (FOR PTA / DISCHARGE USE ONLY): INTRAVENOUS | 0 refills | Status: DC

## 2018-02-15 MED ORDER — NIZATIDINE 150 MG PO CAPS: 150.0000 mg | ORAL_CAPSULE | Freq: Two times a day (BID) | ORAL | 0 refills | Status: DC

## 2018-02-15 MED ORDER — FOLIC ACID 1 MG PO TABS: 1.0000 mg | ORAL_TABLET | Freq: Every day | ORAL | 3 refills | Status: AC

## 2018-02-15 MED ORDER — OXYCODONE HCL 5 MG PO TABS: 5.0000 mg | ORAL_TABLET | Freq: Four times a day (QID) | ORAL | 0 refills | Status: DC | PRN

## 2018-02-15 MED ORDER — NEPRO/CARBSTEADY PO LIQD: 237.0000 mL | Freq: Two times a day (BID) | ORAL | 0 refills | Status: DC

## 2018-02-15 MED ORDER — DOCUSATE SODIUM 100 MG PO CAPS: 100.0000 mg | ORAL_CAPSULE | Freq: Two times a day (BID) | ORAL | 0 refills | Status: DC

## 2018-02-15 NOTE — Clinical Social Work Note (Signed)
The patient will discharge today to [facility] via family transport. The patient and facility are aware and in agreement with this plan. The CSW has sent all documentation to the facility, and the discharge packet has been delivered to the patient's chart. The CSW is signing off. Please consult should needs arise.  Santiago Bumpers, MSW, Latanya Presser [phone]

## 2018-02-15 NOTE — Progress Notes (Signed)
[facility]  ROUNDING NOTE   Subjective:  Patient tolerated dialysis treatment quite well yesterday. Hemoglobin down to 6.5. She will be receiving blood transfusion today.  Objective:  Vital signs in last 24 hours:  Temp:  [98.3 F (36.8 C)-98.9 F (37.2 C)] 98.3 F (36.8 C) (10/27 0456) Pulse Rate:  [72-88] 83 (10/27 0916) Resp:  [9-28] 17 (10/27 0456) BP: (122-158)/(50-62) 128/58 (10/27 0916) SpO2:  [94 %-100 %] 96 % (10/27 0456) Weight:  [58.7 kg-61.1 kg] 61.1 kg (10/27 0500)  Weight change:  Filed Weights   02/14/18 1336 02/14/18 1725 02/15/18 0500  Weight: 59 kg 58.7 kg 61.1 kg    Intake/Output: I/O last 3 completed shifts: In: 152.6 [P.O.:60; IV Piggyback:92.6] Out: 1010 [Other:1010]   Intake/Output this shift:  No intake/output data recorded.  Physical Exam: General: NAD  Head: Normocephalic, atraumatic. Moist oral mucosal membranes  Eyes: Anicteric  Neck: Supple, trachea midline  Lungs:  Clear to auscultation  Heart: Regular rate and rhythm  Abdomen:  Soft, nontender  Extremities: no peripheral edema.  Neurologic: Nonfocal, moving all four extremities  Skin: No lesions  Access: PD catheter, left IJ tunneled catheter    Basic Metabolic Panel: Recent Labs  Lab 02/11/18 0350 02/12/18 0801 02/14/18 1421 02/15/18 0409  NA 134* 137  --   --   K 5.3* 4.6  --   --   CL 92* 92*  --   --   CO2 25 29  --   --   GLUCOSE 208* 155*  --   --   BUN 75* 39*  --   --   CREATININE 9.55* 6.19*  --  4.07*  CALCIUM 8.0* 7.9*  --   --   PHOS  --   --  3.6  --     Liver Function Tests: No results for input(s): AST, ALT, ALKPHOS, BILITOT, PROT, ALBUMIN in the last 168 hours. No results for input(s): LIPASE, AMYLASE in the last 168 hours. No results for input(s): AMMONIA in the last 168 hours.  CBC: Recent Labs  Lab 02/10/18 0308 02/11/18 0350 02/11/18 1321 02/12/18 0801 02/15/18 0409  WBC 23.6* 20.3* 20.6* 17.2* 19.1*  HGB 7.9* 7.5* 7.2*  7.6* 6.5*  HCT 26.1* 26.0* 24.7* 26.4* 23.7*  MCV 101.2* 103.2* 103.3* 104.3* 108.7*  PLT 276 284 295 307 347    Cardiac Enzymes: No results for input(s): CKTOTAL, CKMB, CKMBINDEX, TROPONINI in the last 168 hours.  BNP: Invalid input(s): POCBNP  CBG: Recent Labs  Lab 02/13/18 1645 02/14/18 1138 02/14/18 2135 02/15/18 0752 02/15/18 1156  GLUCAP 159* 277* 210* 125* 202*    Microbiology: Results for orders placed or performed during the hospital encounter of 01/29/18  Blood Culture (routine x 2)     Status: Abnormal   Collection Time: 01/29/18  9:55 PM  Result Value Ref Range Status   Specimen Description   Final    BLOOD RIGHT ANTECUBITAL Performed at [facility], [address], [place], [place] [address]    Special Requests   Final    BOTTLES DRAWN AEROBIC AND ANAEROBIC Blood Culture adequate volume Performed at [facility], [address], [place], [place] [address]    Culture  Setup Time   Final    GRAM POSITIVE COCCI IN CLUSTERS IN BOTH AEROBIC AND ANAEROBIC BOTTLES CRITICAL RESULT CALLED TO, READ BACK BY AND VERIFIED WITH: KAREN HAYES 01/30/18 0839 JGF Performed at [facility], [address] [place], [place] [address]  Culture (A)  Final    STAPHYLOCOCCUS AUREUS SUSCEPTIBILITIES PERFORMED ON PREVIOUS CULTURE WITHIN THE LAST 5 DAYS. Performed at [facility], [place] [address] [place], [place] [address]    Report Status 02/01/2018 FINAL  Final  Blood Culture (routine x 2)     Status: Abnormal   Collection Time: 01/29/18  9:55 PM  Result Value Ref Range Status   Specimen Description   Final    BLOOD RIGHT ANTECUBITAL Performed at [facility], [address] [place], [place] [address]    Special Requests   Final    BOTTLES DRAWN AEROBIC AND ANAEROBIC Blood Culture adequate volume Performed at [facility], [address] [place], [place] [address]    Culture  Setup Time   Final    GRAM  POSITIVE COCCI IN CLUSTERS IN BOTH AEROBIC AND ANAEROBIC BOTTLES CRITICAL RESULT CALLED TO, READ BACK BY AND VERIFIED WITH: KAREN HAYES 01/30/18 0839 JGF Performed at [facility], [place] [address] [place], [place] [address]    Culture STAPHYLOCOCCUS AUREUS (A)  Final   Report Status 02/01/2018 FINAL  Final   Organism ID, Bacteria STAPHYLOCOCCUS AUREUS  Final      Susceptibility   Staphylococcus aureus - MIC*    CIPROFLOXACIN <=0.5 SENSITIVE Sensitive     ERYTHROMYCIN <=0.25 SENSITIVE Sensitive     GENTAMICIN <=0.5 SENSITIVE Sensitive     OXACILLIN 0.5 SENSITIVE Sensitive     TETRACYCLINE <=1 SENSITIVE Sensitive     VANCOMYCIN <=0.5 SENSITIVE Sensitive     TRIMETH/SULFA <=10 SENSITIVE Sensitive     CLINDAMYCIN <=0.25 SENSITIVE Sensitive     RIFAMPIN <=0.5 SENSITIVE Sensitive     Inducible Clindamycin NEGATIVE Sensitive     * STAPHYLOCOCCUS AUREUS  Blood Culture ID Panel (Reflexed)     Status: Abnormal   Collection Time: 01/29/18  9:55 PM  Result Value Ref Range Status   Enterococcus species NOT DETECTED NOT DETECTED Final   Vancomycin resistance NOT DETECTED NOT DETECTED Final   Listeria monocytogenes NOT DETECTED NOT DETECTED Final   Staphylococcus species DETECTED (A) NOT DETECTED Final    Comment: CRITICAL RESULT CALLED TO, READ BACK BY AND VERIFIED WITH: KAREN HAYES 01/30/18 0839 JGF    Staphylococcus aureus (BCID) DETECTED (A) NOT DETECTED Final    Comment: CRITICAL RESULT CALLED TO, READ BACK BY AND VERIFIED WITH: KAREN HAYES 01/30/18 0839 JGF Methicillin (oxacillin) susceptible Staphylococcus aureus (MSSA). Preferred therapy is anti staphylococcal beta lactam antibiotic (Cefazolin or Nafcillin), unless clinically contraindicated.    Methicillin resistance NOT DETECTED NOT DETECTED Final   Streptococcus species NOT DETECTED NOT DETECTED Final   Streptococcus agalactiae NOT DETECTED NOT DETECTED Final   Streptococcus pneumoniae NOT DETECTED NOT DETECTED Final    Streptococcus pyogenes NOT DETECTED NOT DETECTED Final   Acinetobacter baumannii NOT DETECTED NOT DETECTED Final   Enterobacteriaceae species NOT DETECTED NOT DETECTED Final   Enterobacter cloacae complex NOT DETECTED NOT DETECTED Final   Escherichia coli NOT DETECTED NOT DETECTED Final   Klebsiella oxytoca NOT DETECTED NOT DETECTED Final   Klebsiella pneumoniae NOT DETECTED NOT DETECTED Final   Proteus species NOT DETECTED NOT DETECTED Final   Serratia marcescens NOT DETECTED NOT DETECTED Final   Carbapenem resistance NOT DETECTED NOT DETECTED Final   Haemophilus influenzae NOT DETECTED NOT DETECTED Final   Neisseria meningitidis NOT DETECTED NOT DETECTED Final   Pseudomonas aeruginosa NOT DETECTED NOT DETECTED Final   Candida albicans NOT DETECTED NOT DETECTED Final   Candida  glabrata NOT DETECTED NOT DETECTED Final   Candida krusei NOT DETECTED NOT DETECTED Final   Candida parapsilosis NOT DETECTED NOT DETECTED Final   Candida tropicalis NOT DETECTED NOT DETECTED Final    Comment: Performed at [facility], [address] [place], [place] [address]  MRSA PCR Screening     Status: None   Collection Time: 01/29/18 10:55 PM  Result Value Ref Range Status   MRSA by PCR NEGATIVE NEGATIVE Final    Comment:        The GeneXpert MRSA Assay (FDA approved for NASAL specimens only), is one component of a comprehensive MRSA colonization surveillance program. It is not intended to diagnose MRSA infection nor to guide or monitor treatment for MRSA infections. Performed at [facility], [address] [place], [place] [address]   Culture, blood (Routine X 2) w Reflex to ID Panel     Status: None   Collection Time: 01/30/18  9:27 AM  Result Value Ref Range Status   Specimen Description BLOOD RIGHT ANTECUBITAL  Final   Special Requests   Final    BOTTLES DRAWN AEROBIC AND ANAEROBIC Blood Culture adequate volume   Culture   Final    NO GROWTH 5 DAYS Performed at  [facility], [address] [place], [place] [address]    Report Status 02/04/2018 FINAL  Final  MRSA PCR Screening     Status: None   Collection Time: 02/05/18 11:23 PM  Result Value Ref Range Status   MRSA by PCR NEGATIVE NEGATIVE Final    Comment:        The GeneXpert MRSA Assay (FDA approved for NASAL specimens only), is one component of a comprehensive MRSA colonization surveillance program. It is not intended to diagnose MRSA infection nor to guide or monitor treatment for MRSA infections. Performed at [facility], [address], [place], [place] [address]   Aerobic/Anaerobic Culture (surgical/deep wound)     Status: None   Collection Time: 02/06/18  9:11 AM  Result Value Ref Range Status   Specimen Description TOE GREAT LEFT  Final   Special Requests   Final    NONE Performed at [facility], [address] [place], [place] [address]    Gram Stain NO WBC SEEN NO ORGANISMS SEEN   Final   Culture   Final    RARE STAPHYLOCOCCUS AUREUS NO ANAEROBES ISOLATED Performed at [facility], [place] [address], [place], [place] [address]    Report Status 02/12/2018 FINAL  Final   Organism ID, Bacteria STAPHYLOCOCCUS AUREUS  Final      Susceptibility   Staphylococcus aureus - MIC*    CIPROFLOXACIN <=0.5 SENSITIVE Sensitive     ERYTHROMYCIN <=0.25 SENSITIVE Sensitive     GENTAMICIN <=0.5 SENSITIVE Sensitive     OXACILLIN <=0.25 SENSITIVE Sensitive     TETRACYCLINE <=1 SENSITIVE Sensitive     VANCOMYCIN <=0.5 SENSITIVE Sensitive     TRIMETH/SULFA <=10 SENSITIVE Sensitive     CLINDAMYCIN <=0.25 SENSITIVE Sensitive     RIFAMPIN <=0.5 SENSITIVE Sensitive     Inducible Clindamycin NEGATIVE Sensitive     * RARE STAPHYLOCOCCUS AUREUS  Culture, blood (Routine X 2) w Reflex to ID Panel     Status: None (Preliminary result)   Collection Time: 02/13/18  1:09 AM  Result Value Ref Range Status   Specimen Description BLOOD RIGHT ANTECUBITAL  Final    Special Requests   Final    BOTTLES DRAWN AEROBIC AND  ANAEROBIC Blood Culture results may not be optimal due to an excessive volume of blood received in culture bottles   Culture   Final    NO GROWTH 2 DAYS Performed at [facility], [address] [place], [place] [address]    Report Status PENDING  Incomplete  Culture, blood (Routine X 2) w Reflex to ID Panel     Status: None (Preliminary result)   Collection Time: 02/13/18  1:09 AM  Result Value Ref Range Status   Specimen Description BLOOD BLOOD LEFT HAND  Final   Special Requests   Final    BOTTLES DRAWN AEROBIC AND ANAEROBIC Blood Culture results may not be optimal due to an excessive volume of blood received in culture bottles   Culture   Final    NO GROWTH 2 DAYS Performed at [facility], [address], [place], [place] [address]    Report Status PENDING  Incomplete    Coagulation Studies: No results for input(s): LABPROT, INR in the last 72 hours.  Urinalysis: No results for input(s): COLORURINE, LABSPEC, PHURINE, GLUCOSEU, HGBUR, BILIRUBINUR, KETONESUR, PROTEINUR, UROBILINOGEN, NITRITE, LEUKOCYTESUR in the last 72 hours.  Invalid input(s): APPERANCEUR    Imaging: No results found.   Medications:   .  ceFAZolin (ANCEF) IV Stopped (02/14/18 2249)   . sodium chloride   Intravenous Once  . acetaminophen  650 mg Oral Q8H  . apixaban  2.5 mg Oral BID  . aspirin EC  81 mg Oral Daily  . atorvastatin  80 mg Oral QHS  . calcitRIOL  0.5 mcg Oral Q breakfast  . carvedilol  3.125 mg Oral BID WC  . Chlorhexidine Gluconate Cloth  6 each Topical Q0600  . cholecalciferol  400 Units Oral Once per day on Mon Wed Fri  . docusate sodium  100 mg Oral BID  . epoetin (EPOGEN/PROCRIT) injection  4,000 Units Intravenous Q M,W,F-HD  . famotidine  20 mg Oral Daily  . feeding supplement (NEPRO CARB STEADY)  237 mL Oral BID BM  .  HYDROmorphone (DILAUDID) injection  0.5 mg Intravenous Once  . insulin aspart   0-5 Units Subcutaneous QHS  . insulin aspart  0-9 Units Subcutaneous TID WC  . isosorbide mononitrate  30 mg Oral Daily  . LORazepam  0.5 mg Intravenous Once  . midodrine  10 mg Oral Once per day on Tue Thu Sat  . mirtazapine  7.5 mg Oral QHS  . multivitamin  1 tablet Oral QHS  . multivitamin with minerals  1 tablet Oral Daily  . multivitamin-lutein  1 capsule Oral Daily  . sevelamer carbonate  1,600 mg Oral TID WC   acetaminophen **OR** acetaminophen, bisacodyl, HYDROmorphone (DILAUDID) injection, metoCLOPramide (REGLAN) injection, nitroGLYCERIN, ondansetron **OR** ondansetron (ZOFRAN) IV, oxyCODONE, traZODone  Assessment/ Plan:  [name] is a 67 y.o. african [place] female with end stage renal disease on peritoneal dialysis, hypertension, congestive heart failure, coronary artery disease status post CABG, diabetes mellitus type II insulin dependent, GERD, peripheral vascular disease admitted to [facility] on 01/29/2018 for leg pain.    [facility] CCPD 10 hours 5 exchanges 2 liter fills  Transition to hemodialysis.   1. End Stage Renal Disease with hyperkalemia: transitioned from PD to hemodialysis.  -Patient completed hemodialysis yesterday.  No acute indication for dialysis today.  Maintain the patient on midodrine with dialysis treatments.  2.  Staphylococcus aureus bacteremia/Sepsis/ Left great toe osteomyelitis/endocarditis Temporary dialysis catheter placed on 10/18 after right permcath was  removed  - Cefazolin for 6 weeks, she will need this as an outpatient as well.  3. Anemia of chronic kidney disease:  Hemoglobin down to 6.5.  Agree with blood transition plans today.  4. Secondary Hyperparathyroidism:  -Serum phosphorus at target at 3.6.  5. Hypertension:  -Maintain the patient on midodrine to help maintain blood pressure during dialysis treatment.  6. Peripheral vascular disease: left first toe with gangrene and pain.  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 Agree with above plan.   Lavonia Dana, MD [facility]  10/27/20191:30 PM

## 2018-02-15 NOTE — Discharge Summary (Signed)
[facility] NAME: Stephanie Ellis    MR#:  [id number]  DATE OF BIRTH:  [birth year]  DATE OF ADMISSION:  01/29/2018 ADMITTING PHYSICIAN: Amelia Jo, MD  DATE OF DISCHARGE: 02/15/2018   PRIMARY CARE PHYSICIAN: System, Pcp Not In    ADMISSION DIAGNOSIS:  Peripheral vascular disease ([place]) [I73.9] Right leg pain [M79.604] Left leg pain [M79.605] Ulcer of great toe, left, with unspecified severity ([place]) [L97.529]  DISCHARGE DIAGNOSIS:  Active Problems:   Sepsis ([place])   Diabetic osteomyelitis ([place])   Peripheral vascular disease ([place])   Advanced care planning/counseling discussion   Palliative care by specialist   Goals of care, counseling/discussion   ESRD (end stage renal disease) ([place])   SECONDARY DIAGNOSIS:   Past Medical History:  Diagnosis Date  . (HFpEF) heart failure with preserved ejection fraction ([place])    a. 01/2018 Echo: EF 50-55%, no rwma, Gr1 DD, triv AI, mild MR. Midly dil LA/RV, mod reduced RV fxn. Irreg thickening of TV w/ mobile echodensity that appears to arise from valve.  . Anemia   . Anorexia   . Bacteremia due to Pseudomonas   . Carotid arterial disease ([place])    a. 01/2017 Carotid U/S: RICA <56, LICA <38.  Marland Kitchen Complication of anesthesia    a. Pt reports h/o complication on 67 different occasions - ? hypotension/arrest.  . 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 01/2015 Cath [facility]): 70% LM, p-mLAD 50-60% (Resting FFR 0.75), mRCA 80-90%, ~40 Ost OM & D1-->CABG; b. 01/2016 Staged PCI of LCX x 2 and RCA.  Marland Kitchen ESRD (end stage renal disease) on dialysis ([place])    a. ESRD secondary to acute kidney failure s/p CABG-->PD  . Essential hypertension   . GERD (gastroesophageal reflux disease)   . Heart murmur   . Hypercholesterolemia   . Myocardial infarction ([place]) 2016  . PVD (peripheral vascular disease) ([place])    a. 01/30/2018 PV Angio: Sev Left Popliteal dzs s/p PTA w/ 91m  lutonix DEB w/ mech aspiration of the L popliteal, L AT, and Left tibioperoneal trunck and peroneal artery.  . 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  [facility]: Dr. BMarland KitchenHaithcock: CABG x 3, LIMA to LAD, SVG to RCA, SVG to OM3, EVH  . Sinusitis 2019  . Type II diabetes mellitus with complication ([place]    CAD    HOSPITAL COURSE:   1.Sepsis, with MSSA with left great toe osteomyelitis, Tricuspid endocarditis.  Status post removal of her permacath by vascular and temporary dialysis catheter. Repeat blood cultures from 10/11 has been negative so far.  Better lleukocytosis. Per Dr. RSteva Ready will need 6 weeks of IVCefazolinwhichcan be given during dialysis so she does not need another catheter leukocytosis is improving. 2 grams- tuesday, 2grams thursaday and 3 grams on saturday until 03/13/18 with weekly CBC/CMP.  Acute ischemia of the left foot status post angioplasty of left popliteal by vascular , heparin drip was discontinued due to epistaxis.  Resumed Heparin drip.  But the patient had hemoptysis and epistaxis.  Discontinued heparin drip. On eliquis low dose now.  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 Dressing changed 3 x's per week. F/u with podiatry in 2 weeks per Dr. FVickki Muff  2.  Acute encephalopathy due to #1 now improved.   3.PAD.,  Left leg ischemia, status post hospitalization, continue aspirin and statins. Heparin drip is restarted, which is discontinued due to hemoptysis and epistaxis.  May start low dose Eliquis after permacath per Dr. Lucky Cowboy. Started Eliquis.   4.End-stage renal disease on peritoneal dialysis.   Patient does not want peritoneal dialysis anymore. S/P permacath placement and continue hemodialysis now per Dr. Holley Raring. follow with your out pt Nephrologist to take PD catheter out once satble.   5.Diabetes type 2.controlled, continue insulin with sliding scale coverage. Continue carb controlled renal diet.  6.  Chronic  diastolic CHF, LV EF: 88% - 55%, currently clinically compensated,continue maintenance therapy.  Continue Coreg, Imdur, midodrine.  7.Peripheral neuropathy,continue pain medication.  #8. nausea likely secondary to sepsis/narcotic induced: And possible diabetic gastroparesis: continue Reglan prn, PPI, improved.  #.9. Hyperkalemia.  Discontinued ibesartan, improved with hemodialysis.  # 10 Hemoptysis and epistaxis.  Discontinued heparin drip, chest x-ray and CT are unremarkable.  Improved.  #11 Anemia of chronic disease.  Hemoglobin decreased to 7.2.  Follow-up hemoglobin 7.6, on Epo during dialysis PRBC transfusion PRN.  Have macrocytosis- will give Vit F02 and folic acid on d/c.   Transfuse as < 7 hb, one unit PRBC.  DISCHARGE CONDITIONS:   Stable.  CONSULTS OBTAINED:  Treatment Team:  Albertine Patricia, Colette Ribas, MD Algernon Huxley, MD Erby Pian, MD  DRUG ALLERGIES:   Allergies  Allergen Reactions  . Chlorthalidone Anaphylaxis, Itching and Rash  . Fentanyl Rash  . Midazolam Rash  . Ace Inhibitors Other (See Comments)    Reaction:  Hyperkalemia, agitation   . Angiotensin Receptor Blockers Other (See Comments)    Reaction:  Hyperkalemia, agitation   . Norvasc [Amlodipine] Itching and Rash  . Phenergan [Promethazine Hcl] Anxiety    "antsy, can't sit still"    DISCHARGE MEDICATIONS:   Allergies as of 02/15/2018      Reactions   Chlorthalidone Anaphylaxis, Itching, Rash   Fentanyl Rash   Midazolam Rash   Ace Inhibitors Other (See Comments)   Reaction:  Hyperkalemia, agitation    Angiotensin Receptor Blockers Other (See Comments)   Reaction:  Hyperkalemia, agitation    Norvasc [amlodipine] Itching, Rash   Phenergan [promethazine Hcl] Anxiety   "antsy, can't sit still"      Medication List    STOP taking these medications   ATROPINE SULFATE-NACL OP   gentamicin cream 0.1 % Commonly known as:  GARAMYCIN   HYDROcodone-acetaminophen  5-325 MG tablet Commonly known as:  NORCO/VICODIN   lisinopril 10 MG tablet Commonly known as:  PRINIVIL,ZESTRIL   metoCLOPramide 10 MG tablet Commonly known as:  REGLAN   metoprolol tartrate 25 MG tablet Commonly known as:  LOPRESSOR   polyvinyl alcohol 1.4 % ophthalmic solution Commonly known as:  LIQUIFILM TEARS   predniSONE 10 MG tablet Commonly known as:  DELTASONE   ranitidine 300 MG tablet Commonly known as:  ZANTAC Replaced by:  nizatidine 150 MG capsule   valsartan 80 MG tablet Commonly known as:  DIOVAN     TAKE these medications   apixaban 2.5 MG Tabs tablet Commonly known as:  ELIQUIS Take 1 tablet (2.5 mg total) by mouth 2 (two) times daily.   aspirin EC 81 MG tablet Take 1 tablet (81 mg total) by mouth daily.   atorvastatin 80 MG tablet Commonly known as:  LIPITOR Take 80 mg by mouth at bedtime.   calcitRIOL 0.5 MCG capsule Commonly known as:  ROCALTROL Take 0.5 mcg by mouth daily.   carvedilol 3.125 MG tablet Commonly known as:  COREG Take 3.125 mg by mouth 2 (two) times daily with a meal.  ceFAZolin  IVPB Commonly known as:  ANCEF Indication:  Osteomyelitis/Tricuspic Endocarditis Last Day of Therapy:  03/13/18 Labs - Once weekly:  CBC/D and CMP, Start taking on:  02/17/2018   Cholecalciferol 1000 units tablet Take 1 tablet by mouth daily. Monday, Wednesday and Friday at dialysis   docusate sodium 100 MG capsule Commonly known as:  COLACE Take 1 capsule (100 mg total) by mouth 2 (two) times daily.   feeding supplement (NEPRO CARB STEADY) Liqd Take 237 mLs by mouth 2 (two) times daily between meals.   folic acid 1 MG tablet Commonly known as:  FOLVITE Take 1 tablet (1 mg total) by mouth daily.   insulin glargine 100 unit/mL Sopn Commonly known as:  LANTUS Inject 0.08 mLs (8 Units total) into the skin at bedtime. What changed:  how much to take   isosorbide mononitrate 30 MG 24 hr tablet Commonly known as:  IMDUR Take 30 mg by  mouth daily.   midodrine 10 MG tablet Commonly known as:  PROAMATINE Take 1 tablet (10 mg total) by mouth 3 (three) times a week. Start taking on:  02/17/2018   mirtazapine 7.5 MG tablet Commonly known as:  REMERON Take 7.5 mg by mouth at bedtime.   multivitamin with minerals Tabs tablet Take 2 tablets by mouth daily. Gummy vitamins   nitroGLYCERIN 0.4 MG SL tablet Commonly known as:  NITROSTAT Place 1 tablet (0.4 mg total) under the tongue every 5 (five) minutes as needed for chest pain.   nizatidine 150 MG capsule Commonly known as:  AXID Take 1 capsule (150 mg total) by mouth 2 (two) times daily. Replaces:  ranitidine 300 MG tablet   Omega-3 300 MG Caps Take 2 capsules by mouth daily.   ondansetron 4 MG tablet Commonly known as:  ZOFRAN Take 4 mg by mouth every 8 (eight) hours as needed for nausea or vomiting.   oxyCODONE 5 MG immediate release tablet Commonly known as:  Oxy IR/ROXICODONE Take 1 tablet (5 mg total) by mouth every 6 (six) hours as needed for moderate pain or severe pain.   PRORENAL + D Tabs Take 1 tablet by mouth daily.   sevelamer carbonate 800 MG tablet Commonly known as:  RENVELA Take 800 mg by mouth 3 (three) times daily with meals.   vitamin B-12 1000 MCG tablet Commonly known as:  CYANOCOBALAMIN Take 1 tablet (1,000 mcg total) by mouth daily.            Home Infusion Instuctions  (From admission, onward)         Start     Ordered   02/15/18 0000  Home infusion instructions [facility] May follow [facility] Dosing Protocol; May administer Cathflo as needed to maintain patency of vascular access device.; Flushing of vascular access device: per [facility] Protocol: 0.9% NaCl pre/post medica...    Question Answer Comment  Instructions May follow [facility] Dosing Protocol   Instructions May administer Cathflo as needed to maintain patency of vascular access device.   Instructions Flushing of vascular access device: per [facility] Protocol:  0.9% NaCl pre/post medication administration and prn patency; Heparin 100 u/ml, 24m for implanted ports and Heparin 10u/ml, 570mfor all other central venous catheters.   Instructions May follow [facility] Anaphylaxis Protocol for First Dose Administration in the home: 0.9% NaCl at 25-50 ml/hr to maintain IV access for protocol meds. Epinephrine 0.3 ml IV/IM PRN and Benadryl 25-50 IV/IM PRN s/s of anaphylaxis.   Instructions [facility] Infusion Coordinator (  RN) to assist per patient IV care needs in the home PRN.      02/15/18 1124           DISCHARGE INSTRUCTIONS:    Follow with podiatry an nephrology clinic.  If you experience worsening of your admission symptoms, develop shortness of breath, life threatening emergency, suicidal or homicidal thoughts you must seek medical attention immediately by calling 911 or calling your MD immediately  if symptoms less severe.  You Must read complete instructions/literature along with all the possible adverse reactions/side effects for all the Medicines you take and that have been prescribed to you. Take any new Medicines after you have completely understood and accept all the possible adverse reactions/side effects.   Please note  You were cared for by a hospitalist during your hospital stay. If you have any questions about your discharge medications or the care you received while you were in the hospital after you are discharged, you can call the unit and asked to speak with the hospitalist on call if the hospitalist that took care of you is not available. Once you are discharged, your primary care physician will handle any further medical issues. Please note that NO REFILLS for any discharge medications will be authorized once you are discharged, as it is imperative that you return to your primary care physician (or establish a relationship with a primary care physician if you do not have one) for your aftercare needs so that they can reassess your  need for medications and monitor your lab values.    Today   CHIEF COMPLAINT:   Chief Complaint  Patient presents with  . Leg Pain    HISTORY OF PRESENT ILLNESS:  Stephanie Ellis  is a 67 y.o. female with a known history of end-stage renal disease on peritoneal dialysis, peripheral neuropathy, CHF, hypertension, type 2 diabetes and other comorbidities. History is obtained from reviewing the medical records and from discussion with emergency room physician and the family.  Patient is unable to provide history due to confusion and lethargy. Patient was brought to emergency room for severe left leg pain, going on for the past 2 to 3 days, gradually getting worse.  Although lethargic, patient is very sensitive and complains of severe pain with palpation of the left lower extremity below the knee.  Patient has a left great toe ulcer for which she has been following with wound care clinic.  Per family, patient has chronic pain in both lower extremities secondary to peripheral neuropathy, but patient has been complaining of new, severe burning pain to the left lower extremity in the past 2 to 3 days.  No reports of cough, chest pain, abdominal pain, vomiting, diarrhea, bleeding. Blood test done emergency room reveal elevated lactic acid level at 2 and elevated WBC at 13.2. While in the emergency room, patient's temperature is noted to be elevated, at 102. Left foot x-ray shows soft tissue defect over the plantar aspect of the left first toe with underlying erosion of the distal phalangeal tuft suggesting osteomyelitis. Patient is admitted for further evaluation and treatment.   VITAL SIGNS:  Blood pressure (!) 128/58, pulse 83, temperature 98.3 F (36.8 C), temperature source Oral, resp. rate 17, height 5' 4"  (1.626 m), weight 61.1 kg, SpO2 96 %.  I/O:    Intake/Output Summary (Last 24 hours) at 02/15/2018 1131 Last data filed at 02/15/2018 0300 Gross per 24 hour  Intake 101.02 ml  Output  1010 ml  Net -908.98 ml  PHYSICAL EXAMINATION:   GENERAL: Pleasant-appearing in no apparent distress.  HEAD, EYES, EARS, NOSE AND THROAT: Atraumatic, normocephalic. Extraocular muscles are intact. Pupils equal and reactive to light. Sclerae anicteric. No conjunctival injection. No oro-pharyngeal erythema.  NECK: Supple. There is no jugular venous distention. No bruits, no lymphadenopathy, no thyromegaly.  HEART: Regular rate and rhythm,. No murmurs, no rubs, no clicks.  LUNGS: Clear to auscultation bilaterally. No rales or rhonchi. No wheezes.  ABDOMEN: Soft, flat, nontender, nondistended. Has good bowel sounds. No hepatosplenomegaly appreciated.  EXTREMITIES: Status left great toe amputation in dressing. NEUROLOGIC: The patient is alert, awake, and oriented x3 with no focal motor or sensory deficits appreciated bilaterally.  SKIN: Moist and warm with no rashes appreciated.  Psych: Not anxious, depressed LN: No inguinal LN enlargement  DATA REVIEW:   CBC Recent Labs  Lab 02/15/18 0409  WBC 19.1*  HGB 6.5*  HCT 23.7*  PLT 347    Chemistries  Recent Labs  Lab 02/12/18 0801 02/15/18 0409  NA 137  --   K 4.6  --   CL 92*  --   CO2 29  --   GLUCOSE 155*  --   BUN 39*  --   CREATININE 6.19* 4.07*  CALCIUM 7.9*  --     Cardiac Enzymes No results for input(s): TROPONINI in the last 168 hours.  Microbiology Results  Results for orders placed or performed during the hospital encounter of 01/29/18  Blood Culture (routine x 2)     Status: Abnormal   Collection Time: 01/29/18  9:55 PM  Result Value Ref Range Status   Specimen Description   Final    BLOOD RIGHT ANTECUBITAL Performed at [facility], [address], [place], [place] [address]    Special Requests   Final    BOTTLES DRAWN AEROBIC AND ANAEROBIC Blood Culture adequate volume Performed at [facility], [address] [place], [place] [address]    Culture  Setup Time   Final    GRAM  POSITIVE COCCI IN CLUSTERS IN BOTH AEROBIC AND ANAEROBIC BOTTLES CRITICAL RESULT CALLED TO, READ BACK BY AND VERIFIED WITH: KAREN HAYES 01/30/18 0839 JGF Performed at [facility], [address] [place], [place] [address]    Culture (A)  Final    STAPHYLOCOCCUS AUREUS SUSCEPTIBILITIES PERFORMED ON PREVIOUS CULTURE WITHIN THE LAST 5 DAYS. Performed at [facility], [place] [address], [place], [place] [address]    Report Status 02/01/2018 FINAL  Final  Blood Culture (routine x 2)     Status: Abnormal   Collection Time: 01/29/18  9:55 PM  Result Value Ref Range Status   Specimen Description   Final    BLOOD RIGHT ANTECUBITAL Performed at [facility], [address] [place], [place] [address]    Special Requests   Final    BOTTLES DRAWN AEROBIC AND ANAEROBIC Blood Culture adequate volume Performed at [facility], [address] [place], [place] [address]    Culture  Setup Time   Final    GRAM POSITIVE COCCI IN CLUSTERS IN BOTH AEROBIC AND ANAEROBIC BOTTLES CRITICAL RESULT CALLED TO, READ BACK BY AND VERIFIED WITH: KAREN HAYES 01/30/18 0839 JGF Performed at [facility], [place] [address], [place], [place] [address]    Culture STAPHYLOCOCCUS AUREUS (A)  Final   Report Status 02/01/2018 FINAL  Final   Organism ID, Bacteria STAPHYLOCOCCUS AUREUS  Final      Susceptibility   Staphylococcus aureus -  MIC*    CIPROFLOXACIN <=0.5 SENSITIVE Sensitive     ERYTHROMYCIN <=0.25 SENSITIVE Sensitive     GENTAMICIN <=0.5 SENSITIVE Sensitive     OXACILLIN 0.5 SENSITIVE Sensitive     TETRACYCLINE <=1 SENSITIVE Sensitive     VANCOMYCIN <=0.5 SENSITIVE Sensitive     TRIMETH/SULFA <=10 SENSITIVE Sensitive     CLINDAMYCIN <=0.25 SENSITIVE Sensitive     RIFAMPIN <=0.5 SENSITIVE Sensitive     Inducible Clindamycin NEGATIVE Sensitive     * STAPHYLOCOCCUS AUREUS  Blood Culture ID Panel (Reflexed)     Status: Abnormal   Collection Time: 01/29/18  9:55 PM   Result Value Ref Range Status   Enterococcus species NOT DETECTED NOT DETECTED Final   Vancomycin resistance NOT DETECTED NOT DETECTED Final   Listeria monocytogenes NOT DETECTED NOT DETECTED Final   Staphylococcus species DETECTED (A) NOT DETECTED Final    Comment: CRITICAL RESULT CALLED TO, READ BACK BY AND VERIFIED WITH: KAREN HAYES 01/30/18 0839 JGF    Staphylococcus aureus (BCID) DETECTED (A) NOT DETECTED Final    Comment: CRITICAL RESULT CALLED TO, READ BACK BY AND VERIFIED WITH: KAREN HAYES 01/30/18 0839 JGF Methicillin (oxacillin) susceptible Staphylococcus aureus (MSSA). Preferred therapy is anti staphylococcal beta lactam antibiotic (Cefazolin or Nafcillin), unless clinically contraindicated.    Methicillin resistance NOT DETECTED NOT DETECTED Final   Streptococcus species NOT DETECTED NOT DETECTED Final   Streptococcus agalactiae NOT DETECTED NOT DETECTED Final   Streptococcus pneumoniae NOT DETECTED NOT DETECTED Final   Streptococcus pyogenes NOT DETECTED NOT DETECTED Final   Acinetobacter baumannii NOT DETECTED NOT DETECTED Final   Enterobacteriaceae species NOT DETECTED NOT DETECTED Final   Enterobacter cloacae complex NOT DETECTED NOT DETECTED Final   Escherichia coli NOT DETECTED NOT DETECTED Final   Klebsiella oxytoca NOT DETECTED NOT DETECTED Final   Klebsiella pneumoniae NOT DETECTED NOT DETECTED Final   Proteus species NOT DETECTED NOT DETECTED Final   Serratia marcescens NOT DETECTED NOT DETECTED Final   Carbapenem resistance NOT DETECTED NOT DETECTED Final   Haemophilus influenzae NOT DETECTED NOT DETECTED Final   Neisseria meningitidis NOT DETECTED NOT DETECTED Final   Pseudomonas aeruginosa NOT DETECTED NOT DETECTED Final   Candida albicans NOT DETECTED NOT DETECTED Final   Candida glabrata NOT DETECTED NOT DETECTED Final   Candida krusei NOT DETECTED NOT DETECTED Final   Candida parapsilosis NOT DETECTED NOT DETECTED Final   Candida tropicalis NOT DETECTED  NOT DETECTED Final    Comment: Performed at [facility], [address] [place], [place] [address]  MRSA PCR Screening     Status: None   Collection Time: 01/29/18 10:55 PM  Result Value Ref Range Status   MRSA by PCR NEGATIVE NEGATIVE Final    Comment:        The GeneXpert MRSA Assay (FDA approved for NASAL specimens only), is one component of a comprehensive MRSA colonization surveillance program. It is not intended to diagnose MRSA infection nor to guide or monitor treatment for MRSA infections. Performed at [facility], [address] [place], [place] [address]   Culture, blood (Routine X 2) w Reflex to ID Panel     Status: None   Collection Time: 01/30/18  9:27 AM  Result Value Ref Range Status   Specimen Description BLOOD RIGHT ANTECUBITAL  Final   Special Requests   Final    BOTTLES DRAWN AEROBIC AND ANAEROBIC Blood Culture adequate volume   Culture   Final    NO GROWTH 5 DAYS  Performed at [facility], [address] [place], [place] [address]    Report Status 02/04/2018 FINAL  Final  MRSA PCR Screening     Status: None   Collection Time: 02/05/18 11:23 PM  Result Value Ref Range Status   MRSA by PCR NEGATIVE NEGATIVE Final    Comment:        The GeneXpert MRSA Assay (FDA approved for NASAL specimens only), is one component of a comprehensive MRSA colonization surveillance program. It is not intended to diagnose MRSA infection nor to guide or monitor treatment for MRSA infections. Performed at [facility], [address] [place], [place] [address]   Aerobic/Anaerobic Culture (surgical/deep wound)     Status: None   Collection Time: 02/06/18  9:11 AM  Result Value Ref Range Status   Specimen Description TOE GREAT LEFT  Final   Special Requests   Final    NONE Performed at [facility], [address], [place], [place] [address]    Gram Stain NO WBC SEEN NO ORGANISMS SEEN   Final   Culture   Final     RARE STAPHYLOCOCCUS AUREUS NO ANAEROBES ISOLATED Performed at [facility], [place] [address] [place], [place] [address]    Report Status 02/12/2018 FINAL  Final   Organism ID, Bacteria STAPHYLOCOCCUS AUREUS  Final      Susceptibility   Staphylococcus aureus - MIC*    CIPROFLOXACIN <=0.5 SENSITIVE Sensitive     ERYTHROMYCIN <=0.25 SENSITIVE Sensitive     GENTAMICIN <=0.5 SENSITIVE Sensitive     OXACILLIN <=0.25 SENSITIVE Sensitive     TETRACYCLINE <=1 SENSITIVE Sensitive     VANCOMYCIN <=0.5 SENSITIVE Sensitive     TRIMETH/SULFA <=10 SENSITIVE Sensitive     CLINDAMYCIN <=0.25 SENSITIVE Sensitive     RIFAMPIN <=0.5 SENSITIVE Sensitive     Inducible Clindamycin NEGATIVE Sensitive     * RARE STAPHYLOCOCCUS AUREUS  Culture, blood (Routine X 2) w Reflex to ID Panel     Status: None (Preliminary result)   Collection Time: 02/13/18  1:09 AM  Result Value Ref Range Status   Specimen Description BLOOD RIGHT ANTECUBITAL  Final   Special Requests   Final    BOTTLES DRAWN AEROBIC AND ANAEROBIC Blood Culture results may not be optimal due to an excessive volume of blood received in culture bottles   Culture   Final    NO GROWTH 2 DAYS Performed at [facility], [address], [place], [place] [address]    Report Status PENDING  Incomplete  Culture, blood (Routine X 2) w Reflex to ID Panel     Status: None (Preliminary result)   Collection Time: 02/13/18  1:09 AM  Result Value Ref Range Status   Specimen Description BLOOD BLOOD LEFT HAND  Final   Special Requests   Final    BOTTLES DRAWN AEROBIC AND ANAEROBIC Blood Culture results may not be optimal due to an excessive volume of blood received in culture bottles   Culture   Final    NO GROWTH 2 DAYS Performed at [facility], [address], [place],  [address]    Report Status PENDING  Incomplete    RADIOLOGY:  No results found.  EKG:   Orders placed or performed during the hospital encounter of  01/29/18  . ED EKG 12-Lead  . ED EKG 12-Lead  . ED EKG  . ED EKG  . EKG 12-Lead  . EKG 12-Lead  Management plans discussed with the patient, family and they are in agreement.  CODE STATUS: Full.    Code Status Orders  (From admission, onward)         Start     Ordered   02/01/18 0413  Full code  Continuous     02/01/18 0412        Code Status History    Date Active Date Inactive Code Status Order ID Comments User Context   01/31/2018 2300 02/01/2018 0412 DNR [id number]  Amelia Jo, MD Inpatient   01/31/2018 2037 01/31/2018 2300 DNR [id number]  Awilda Bill, NP Inpatient   01/29/2018 2250 01/31/2018 2037 Full Code [id number]  Amelia Jo, MD Inpatient   10/24/2016 1055 10/25/2016 2243 Full Code [id number]  Hillary Bow, MD ED   10/17/2016 2143 10/19/2016 1440 Full Code [id number]  Vaughan Basta, MD Inpatient   09/04/2016 1708 09/05/2016 1500 Full Code [id number]  Epifanio Lesches, MD ED   08/22/2016 0938 08/22/2016 1833 Full Code [id number]  Algernon Huxley, MD Inpatient   06/27/2016 1121 06/27/2016 1846 Full Code [id number]  Algernon Huxley, MD Inpatient   10/03/2015 2235 10/04/2015 1724 Full Code [id number]  Harrie Foreman, MD Inpatient      TOTAL TIME TAKING CARE OF THIS PATIENT: 35 minutes.    Vaughan Basta M.D on 02/15/2018 at 11:31 AM  Between 7am to 6pm - Pager - [phone]  After 6pm go to [email or web address] - password [email or web address]  Office  [phone]  CC: Primary care physician; System, Pcp Not In   Note: This dictation was prepared with Dragon dictation along with smaller phrase technology. Any transcriptional errors that result from this process are unintentional.

## 2018-02-16 DIAGNOSIS — N185 Chronic kidney disease, stage 5: Secondary | ICD-10-CM | POA: Insufficient documentation

## 2018-02-18 ENCOUNTER — Other Ambulatory Visit: Payer: Self-pay

## 2018-02-18 ENCOUNTER — Inpatient Hospital Stay
Admission: EM | Admit: 2018-02-18 | Discharge: 2018-02-28 | DRG: 270 | Disposition: A | Discharge status: Skilled Nursing Facility | Payer: Medicare Other | Attending: Internal Medicine | Admitting: Internal Medicine

## 2018-02-18 ENCOUNTER — Emergency Department: Payer: Medicare Other

## 2018-02-18 DIAGNOSIS — E1152 Type 2 diabetes mellitus with diabetic peripheral angiopathy with gangrene: Principal | ICD-10-CM | POA: Diagnosis present

## 2018-02-18 DIAGNOSIS — I96 Gangrene, not elsewhere classified: Secondary | ICD-10-CM | POA: Diagnosis not present

## 2018-02-18 DIAGNOSIS — Z951 Presence of aortocoronary bypass graft: Secondary | ICD-10-CM | POA: Diagnosis not present

## 2018-02-18 DIAGNOSIS — Z7901 Long term (current) use of anticoagulants: Secondary | ICD-10-CM

## 2018-02-18 DIAGNOSIS — Z79899 Other long term (current) drug therapy: Secondary | ICD-10-CM | POA: Diagnosis not present

## 2018-02-18 DIAGNOSIS — Z89422 Acquired absence of other left toe(s): Secondary | ICD-10-CM

## 2018-02-18 DIAGNOSIS — I132 Hypertensive heart and chronic kidney disease with heart failure and with stage 5 chronic kidney disease, or end stage renal disease: Secondary | ICD-10-CM | POA: Diagnosis present

## 2018-02-18 DIAGNOSIS — I2510 Atherosclerotic heart disease of native coronary artery without angina pectoris: Secondary | ICD-10-CM | POA: Diagnosis present

## 2018-02-18 DIAGNOSIS — I959 Hypotension, unspecified: Secondary | ICD-10-CM | POA: Diagnosis present

## 2018-02-18 DIAGNOSIS — H547 Unspecified visual loss: Secondary | ICD-10-CM | POA: Diagnosis present

## 2018-02-18 DIAGNOSIS — I998 Other disorder of circulatory system: Secondary | ICD-10-CM | POA: Diagnosis not present

## 2018-02-18 DIAGNOSIS — N186 End stage renal disease: Secondary | ICD-10-CM

## 2018-02-18 DIAGNOSIS — I5032 Chronic diastolic (congestive) heart failure: Secondary | ICD-10-CM | POA: Diagnosis present

## 2018-02-18 DIAGNOSIS — Z885 Allergy status to narcotic agent status: Secondary | ICD-10-CM | POA: Diagnosis not present

## 2018-02-18 DIAGNOSIS — I252 Old myocardial infarction: Secondary | ICD-10-CM

## 2018-02-18 DIAGNOSIS — K219 Gastro-esophageal reflux disease without esophagitis: Secondary | ICD-10-CM | POA: Diagnosis present

## 2018-02-18 DIAGNOSIS — H409 Unspecified glaucoma: Secondary | ICD-10-CM | POA: Diagnosis present

## 2018-02-18 DIAGNOSIS — N2581 Secondary hyperparathyroidism of renal origin: Secondary | ICD-10-CM | POA: Diagnosis present

## 2018-02-18 DIAGNOSIS — E1122 Type 2 diabetes mellitus with diabetic chronic kidney disease: Secondary | ICD-10-CM | POA: Diagnosis present

## 2018-02-18 DIAGNOSIS — I70261 Atherosclerosis of native arteries of extremities with gangrene, right leg: Secondary | ICD-10-CM | POA: Diagnosis not present

## 2018-02-18 DIAGNOSIS — I70262 Atherosclerosis of native arteries of extremities with gangrene, left leg: Secondary | ICD-10-CM | POA: Diagnosis not present

## 2018-02-18 DIAGNOSIS — I70263 Atherosclerosis of native arteries of extremities with gangrene, bilateral legs: Secondary | ICD-10-CM

## 2018-02-18 DIAGNOSIS — E1142 Type 2 diabetes mellitus with diabetic polyneuropathy: Secondary | ICD-10-CM | POA: Diagnosis present

## 2018-02-18 DIAGNOSIS — Z7982 Long term (current) use of aspirin: Secondary | ICD-10-CM | POA: Diagnosis not present

## 2018-02-18 DIAGNOSIS — E7800 Pure hypercholesterolemia, unspecified: Secondary | ICD-10-CM | POA: Diagnosis present

## 2018-02-18 DIAGNOSIS — Z888 Allergy status to other drugs, medicaments and biological substances status: Secondary | ICD-10-CM

## 2018-02-18 DIAGNOSIS — Z992 Dependence on renal dialysis: Secondary | ICD-10-CM | POA: Diagnosis not present

## 2018-02-18 DIAGNOSIS — H538 Other visual disturbances: Secondary | ICD-10-CM | POA: Diagnosis not present

## 2018-02-18 DIAGNOSIS — D631 Anemia in chronic kidney disease: Secondary | ICD-10-CM | POA: Diagnosis present

## 2018-02-18 DIAGNOSIS — Z794 Long term (current) use of insulin: Secondary | ICD-10-CM

## 2018-02-18 DIAGNOSIS — Z955 Presence of coronary angioplasty implant and graft: Secondary | ICD-10-CM | POA: Diagnosis not present

## 2018-02-18 DIAGNOSIS — Z89512 Acquired absence of left leg below knee: Secondary | ICD-10-CM | POA: Diagnosis not present

## 2018-02-18 DIAGNOSIS — T85898A Other specified complication of other internal prosthetic devices, implants and grafts, initial encounter: Secondary | ICD-10-CM | POA: Diagnosis not present

## 2018-02-18 LAB — CBC WITH DIFFERENTIAL/PLATELET
Abs Immature Granulocytes: 0.3 10*3/uL — ABNORMAL HIGH (ref 0.00–0.07)
Basophils Absolute: 0.1 10*3/uL (ref 0.0–0.1)
Basophils Relative: 0 %
Eosinophils Absolute: 0.2 10*3/uL (ref 0.0–0.5)
Eosinophils Relative: 1 %
HCT: 27.9 % — ABNORMAL LOW (ref 36.0–46.0)
Hemoglobin: 8 g/dL — ABNORMAL LOW (ref 12.0–15.0)
Immature Granulocytes: 2 %
Lymphocytes Relative: 10 %
Lymphs Abs: 1.6 10*3/uL (ref 0.7–4.0)
MCH: 29.6 pg (ref 26.0–34.0)
MCHC: 28.7 g/dL — ABNORMAL LOW (ref 30.0–36.0)
MCV: 103.3 fL — ABNORMAL HIGH (ref 80.0–100.0)
Monocytes Absolute: 1 10*3/uL (ref 0.1–1.0)
Monocytes Relative: 6 %
Neutro Abs: 13.4 10*3/uL — ABNORMAL HIGH (ref 1.7–7.7)
Neutrophils Relative %: 81 %
Platelets: 403 10*3/uL — ABNORMAL HIGH (ref 150–400)
RBC: 2.7 MIL/uL — ABNORMAL LOW (ref 3.87–5.11)
RDW: 22.1 % — ABNORMAL HIGH (ref 11.5–15.5)
Smear Review: NORMAL
WBC: 16.6 10*3/uL — ABNORMAL HIGH (ref 4.0–10.5)
nRBC: 0 % (ref 0.0–0.2)

## 2018-02-18 LAB — BPAM RBC
Blood Product Expiration Date: 201911232359
ISSUE DATE / TIME: 201910271610
Unit Type and Rh: 5100

## 2018-02-18 LAB — COMPREHENSIVE METABOLIC PANEL
ALT: <5 U/L (ref 0–44)
AST: 22 U/L (ref 15–41)
Albumin: 2 g/dL — ABNORMAL LOW (ref 3.5–5.0)
Alkaline Phosphatase: 154 U/L — ABNORMAL HIGH (ref 38–126)
Anion gap: 13 (ref 5–15)
BUN: 27 mg/dL — ABNORMAL HIGH (ref 8–23)
CO2: 29 mmol/L (ref 22–32)
Calcium: 8.7 mg/dL — ABNORMAL LOW (ref 8.9–10.3)
Chloride: 97 mmol/L — ABNORMAL LOW (ref 98–111)
Creatinine, Ser: 5.23 mg/dL — ABNORMAL HIGH (ref 0.44–1.00)
GFR calc Af Amer: 9 mL/min — ABNORMAL LOW (ref >60)
GFR calc non Af Amer: 8 mL/min — ABNORMAL LOW (ref >60)
Glucose, Bld: 87 mg/dL (ref 70–99)
Potassium: 4.4 mmol/L (ref 3.5–5.1)
Sodium: 139 mmol/L (ref 135–145)
Total Bilirubin: 0.4 mg/dL (ref 0.3–1.2)
Total Protein: 7.4 g/dL (ref 6.5–8.1)

## 2018-02-18 LAB — TYPE AND SCREEN
ABO/RH(D): B POS
Antibody Screen: NEGATIVE
Unit division: 0

## 2018-02-18 LAB — CULTURE, BLOOD (ROUTINE X 2)
Culture: NO GROWTH
Culture: NO GROWTH

## 2018-02-18 LAB — PROTIME-INR
INR: 1.27
Prothrombin Time: 15.8 seconds — ABNORMAL HIGH (ref 11.4–15.2)

## 2018-02-18 LAB — TROPONIN I: Troponin I: 0.11 ng/mL (ref <0.03)

## 2018-02-18 LAB — GLUCOSE, CAPILLARY
Glucose-Capillary: 121 mg/dL — ABNORMAL HIGH (ref 70–99)
Glucose-Capillary: 153 mg/dL — ABNORMAL HIGH (ref 70–99)

## 2018-02-18 LAB — APTT
aPTT: 45 seconds — ABNORMAL HIGH (ref 24–36)
aPTT: 91 seconds — ABNORMAL HIGH (ref 24–36)

## 2018-02-18 LAB — HEMOGLOBIN A1C
Hgb A1c MFr Bld: 6.5 % — ABNORMAL HIGH (ref 4.8–5.6)
Mean Plasma Glucose: 139.85 mg/dL

## 2018-02-18 LAB — LACTIC ACID, PLASMA
Lactic Acid, Venous: 1.1 mmol/L (ref 0.5–1.9)
Lactic Acid, Venous: 1.1 mmol/L (ref 0.5–1.9)

## 2018-02-18 LAB — HEPARIN LEVEL (UNFRACTIONATED): Heparin Unfractionated: 0.71 IU/mL — ABNORMAL HIGH (ref 0.30–0.70)

## 2018-02-18 MED ORDER — SEVELAMER CARBONATE 800 MG PO TABS
800.0000 mg | ORAL_TABLET | Freq: Three times a day (TID) | ORAL | Status: DC
  Administered 2018-02-18 – 2018-02-24 (×12): 800 mg via ORAL
  Filled 2018-02-18 (×13): qty 1

## 2018-02-18 MED ORDER — SODIUM CHLORIDE 0.9 % IV SOLN
2.0000 g | Freq: Once | INTRAVENOUS | Status: AC
  Administered 2018-02-18: 2 g via INTRAVENOUS
  Filled 2018-02-18: qty 2

## 2018-02-18 MED ORDER — HEPARIN BOLUS VIA INFUSION
4000.0000 [IU] | Freq: Once | INTRAVENOUS | Status: AC
  Administered 2018-02-18: 4000 [IU] via INTRAVENOUS
  Filled 2018-02-18: qty 4000

## 2018-02-18 MED ORDER — ASPIRIN EC 81 MG PO TBEC
81.0000 mg | DELAYED_RELEASE_TABLET | Freq: Every day | ORAL | Status: DC
  Administered 2018-02-20 – 2018-02-28 (×8): 81 mg via ORAL
  Filled 2018-02-18 (×8): qty 1

## 2018-02-18 MED ORDER — PRORENAL + D PO TABS: 1.0000 | ORAL_TABLET | Freq: Every day | ORAL | Status: DC

## 2018-02-18 MED ORDER — ACETAMINOPHEN 325 MG PO TABS
650.0000 mg | ORAL_TABLET | Freq: Four times a day (QID) | ORAL | Status: DC | PRN
  Administered 2018-02-20 – 2018-02-28 (×8): 650 mg via ORAL
  Filled 2018-02-18 (×10): qty 2

## 2018-02-18 MED ORDER — MIDODRINE HCL 5 MG PO TABS
10.0000 mg | ORAL_TABLET | ORAL | Status: DC
  Filled 2018-02-18 (×2): qty 2

## 2018-02-18 MED ORDER — FOLIC ACID 1 MG PO TABS
1.0000 mg | ORAL_TABLET | Freq: Every day | ORAL | Status: DC
  Administered 2018-02-18 – 2018-02-27 (×8): 1 mg via ORAL
  Filled 2018-02-18 (×8): qty 1

## 2018-02-18 MED ORDER — ISOSORBIDE MONONITRATE ER 30 MG PO TB24
30.0000 mg | ORAL_TABLET | Freq: Every day | ORAL | Status: DC
  Administered 2018-02-20 – 2018-02-27 (×7): 30 mg via ORAL
  Filled 2018-02-18 (×7): qty 1

## 2018-02-18 MED ORDER — METRONIDAZOLE IN NACL 5-0.79 MG/ML-% IV SOLN
500.0000 mg | Freq: Three times a day (TID) | INTRAVENOUS | Status: DC
  Administered 2018-02-18 – 2018-02-19 (×4): 500 mg via INTRAVENOUS
  Filled 2018-02-18 (×7): qty 100

## 2018-02-18 MED ORDER — VITAMIN B-12 1000 MCG PO TABS
1000.0000 ug | ORAL_TABLET | Freq: Every day | ORAL | Status: DC
  Administered 2018-02-18 – 2018-02-27 (×8): 1000 ug via ORAL
  Filled 2018-02-18 (×8): qty 1

## 2018-02-18 MED ORDER — ATORVASTATIN CALCIUM 20 MG PO TABS
80.0000 mg | ORAL_TABLET | Freq: Every day | ORAL | Status: DC
  Administered 2018-02-18 – 2018-02-27 (×10): 80 mg via ORAL
  Filled 2018-02-18 (×11): qty 4

## 2018-02-18 MED ORDER — DOCUSATE SODIUM 100 MG PO CAPS
100.0000 mg | ORAL_CAPSULE | Freq: Two times a day (BID) | ORAL | Status: DC
  Administered 2018-02-18 – 2018-02-28 (×19): 100 mg via ORAL
  Filled 2018-02-18 (×20): qty 1

## 2018-02-18 MED ORDER — OXYCODONE HCL 5 MG PO TABS
5.0000 mg | ORAL_TABLET | Freq: Four times a day (QID) | ORAL | Status: DC | PRN
  Administered 2018-02-18 – 2018-02-24 (×5): 5 mg via ORAL
  Filled 2018-02-18 (×5): qty 1

## 2018-02-18 MED ORDER — NEPRO/CARBSTEADY PO LIQD: 237.0000 mL | Freq: Two times a day (BID) | ORAL | Status: DC

## 2018-02-18 MED ORDER — ACETAMINOPHEN 650 MG RE SUPP: 650.0000 mg | Freq: Four times a day (QID) | RECTAL | Status: DC | PRN

## 2018-02-18 MED ORDER — VANCOMYCIN HCL 500 MG IV SOLR
500.0000 mg | Freq: Once | INTRAVENOUS | Status: AC
  Administered 2018-02-18: 500 mg via INTRAVENOUS
  Filled 2018-02-18: qty 500

## 2018-02-18 MED ORDER — ONDANSETRON HCL 4 MG PO TABS: 4.0000 mg | ORAL_TABLET | Freq: Four times a day (QID) | ORAL | Status: DC | PRN

## 2018-02-18 MED ORDER — OMEGA-3-ACID ETHYL ESTERS 1 G PO CAPS
1.0000 | ORAL_CAPSULE | Freq: Every day | ORAL | Status: DC
  Administered 2018-02-18 – 2018-02-27 (×8): 1 g via ORAL
  Filled 2018-02-18 (×8): qty 1

## 2018-02-18 MED ORDER — SODIUM CHLORIDE 0.9 % IV SOLN
1.0000 g | INTRAVENOUS | Status: DC
  Filled 2018-02-18 (×2): qty 1

## 2018-02-18 MED ORDER — CHLORHEXIDINE GLUCONATE CLOTH 2 % EX PADS
6.0000 | MEDICATED_PAD | Freq: Every day | CUTANEOUS | Status: DC
  Administered 2018-02-20 – 2018-02-25 (×5): 6 via TOPICAL

## 2018-02-18 MED ORDER — NITROGLYCERIN 0.4 MG SL SUBL: 0.4000 mg | SUBLINGUAL_TABLET | SUBLINGUAL | Status: DC | PRN

## 2018-02-18 MED ORDER — INSULIN ASPART 100 UNIT/ML ~~LOC~~ SOLN
0.0000 [IU] | Freq: Three times a day (TID) | SUBCUTANEOUS | Status: DC
  Administered 2018-02-18: 5 [IU] via SUBCUTANEOUS
  Administered 2018-02-19 – 2018-02-21 (×3): 1 [IU] via SUBCUTANEOUS
  Administered 2018-02-22: 3 [IU] via SUBCUTANEOUS
  Administered 2018-02-22: 1 [IU] via SUBCUTANEOUS
  Administered 2018-02-23 (×2): 2 [IU] via SUBCUTANEOUS
  Administered 2018-02-23: 1 [IU] via SUBCUTANEOUS
  Administered 2018-02-25 – 2018-02-26 (×3): 2 [IU] via SUBCUTANEOUS
  Administered 2018-02-27: 3 [IU] via SUBCUTANEOUS
  Administered 2018-02-27: 2 [IU] via SUBCUTANEOUS
  Filled 2018-02-18 (×14): qty 1

## 2018-02-18 MED ORDER — HEPARIN (PORCINE) IN NACL 100-0.45 UNIT/ML-% IJ SOLN
1250.0000 [IU]/h | INTRAMUSCULAR | Status: DC
  Administered 2018-02-18 – 2018-02-23 (×6): 1050 [IU]/h via INTRAVENOUS
  Administered 2018-02-23: 1150 [IU]/h via INTRAVENOUS
  Administered 2018-02-24 – 2018-02-25 (×2): 1250 [IU]/h via INTRAVENOUS
  Filled 2018-02-18 (×9): qty 250

## 2018-02-18 MED ORDER — CARVEDILOL 6.25 MG PO TABS
3.1250 mg | ORAL_TABLET | Freq: Two times a day (BID) | ORAL | Status: DC
  Administered 2018-02-18 – 2018-02-27 (×13): 3.125 mg via ORAL
  Filled 2018-02-18 (×15): qty 1

## 2018-02-18 MED ORDER — VANCOMYCIN HCL IN DEXTROSE 1-5 GM/200ML-% IV SOLN
1000.0000 mg | Freq: Once | INTRAVENOUS | Status: AC
  Administered 2018-02-18: 1000 mg via INTRAVENOUS
  Filled 2018-02-18: qty 200

## 2018-02-18 MED ORDER — INSULIN GLARGINE 100 UNIT/ML ~~LOC~~ SOLN
4.0000 [IU] | Freq: Every day | SUBCUTANEOUS | Status: DC
  Administered 2018-02-19 – 2018-02-27 (×8): 4 [IU] via SUBCUTANEOUS
  Filled 2018-02-18 (×10): qty 0.04

## 2018-02-18 MED ORDER — CALCITRIOL 0.25 MCG PO CAPS
0.5000 ug | ORAL_CAPSULE | Freq: Every day | ORAL | Status: DC
  Administered 2018-02-20: 0.5 ug via ORAL
  Filled 2018-02-18 (×3): qty 2

## 2018-02-18 MED ORDER — VANCOMYCIN HCL 500 MG IV SOLR
500.0000 mg | INTRAVENOUS | Status: DC
  Filled 2018-02-18: qty 500

## 2018-02-18 MED ORDER — MIRTAZAPINE 15 MG PO TABS
7.5000 mg | ORAL_TABLET | Freq: Every day | ORAL | Status: DC
  Administered 2018-02-18 – 2018-02-27 (×10): 7.5 mg via ORAL
  Filled 2018-02-18 (×10): qty 1

## 2018-02-18 MED ORDER — ADULT MULTIVITAMIN W/MINERALS CH
2.0000 | ORAL_TABLET | Freq: Every day | ORAL | Status: DC
  Administered 2018-02-18 – 2018-02-22 (×4): 2 via ORAL
  Filled 2018-02-18 (×5): qty 2

## 2018-02-18 MED ORDER — ONDANSETRON HCL 4 MG/2ML IJ SOLN
4.0000 mg | Freq: Four times a day (QID) | INTRAMUSCULAR | Status: DC | PRN
  Administered 2018-02-20: 4 mg via INTRAVENOUS
  Filled 2018-02-18: qty 2

## 2018-02-18 MED ORDER — VANCOMYCIN HCL IN DEXTROSE 500-5 MG/100ML-% IV SOLN
500.0000 mg | INTRAVENOUS | Status: DC
  Filled 2018-02-18: qty 100

## 2018-02-18 MED ORDER — INSULIN ASPART 100 UNIT/ML ~~LOC~~ SOLN
0.0000 [IU] | Freq: Every day | SUBCUTANEOUS | Status: DC
  Administered 2018-02-24: 2 [IU] via SUBCUTANEOUS
  Filled 2018-02-18: qty 1

## 2018-02-18 MED ORDER — CEFAZOLIN SODIUM-DEXTROSE 2-4 GM/100ML-% IV SOLN
2.0000 g | INTRAVENOUS | Status: AC
  Administered 2018-02-19: 2 g via INTRAVENOUS
  Filled 2018-02-18: qty 100

## 2018-02-18 MED ORDER — VANCOMYCIN HCL 10 G IV SOLR
500.0000 mg | INTRAVENOUS | Status: DC
  Filled 2018-02-18: qty 500

## 2018-02-18 MED ORDER — FAMOTIDINE 20 MG PO TABS
10.0000 mg | ORAL_TABLET | Freq: Every day | ORAL | Status: DC
  Administered 2018-02-18 – 2018-02-28 (×10): 10 mg via ORAL
  Filled 2018-02-18 (×11): qty 1

## 2018-02-18 NOTE — ED Notes (Signed)
Dr. Quentin Cornwall located faint pulse in the right and good pulse in left foot via dopler

## 2018-02-18 NOTE — ED Notes (Signed)
pharmacy called and stated they would be sending up 57m of vancomycin to be started as soon as it was delivered.

## 2018-02-18 NOTE — Consult Note (Signed)
[facility] Vascular Consult Note  MRN : [id number]  Stephanie Ellis is a 67 y.o. (11/14/1950) female who presents with chief complaint of  Chief Complaint  Patient presents with  . Wound Check  .  History of Present Illness: I am asked to see the patient by Dr. Quentin Cornwall in the emergency department regarding worsening gangrenous changes to the right foot.  The patient is a very complex, chronically ill individual who is well-known to our service.  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  She had a left great toe amputation although she still has a lot of thick skin changes on the left foot going across the ankle and into the pretibial location.  The right foot has steadily started to have thick skin changes of all the toes and the plantar aspect of the foot down across the heel.  She denies any pain.  No fever or chills.  She is continued to get her regular dialysis treatments.  She has continued to get antibiotics for her endocarditis.  Current Facility-Administered Medications  Medication Dose Route Frequency Provider Last Rate Last Dose  . acetaminophen (TYLENOL) tablet 650 mg  650 mg Oral Q6H PRN Loletha Grayer, MD       Or  . acetaminophen (TYLENOL) suppository 650 mg  650 mg Rectal Q6H PRN Wieting, Richard, MD      . heparin ADULT infusion 100 units/mL (25000 units/282m sodium chloride 0.45%)  1,050 Units/hr Intravenous Continuous RMerlyn Lot MD      . heparin bolus via infusion 4,000 Units  4,000 Units Intravenous Once RMerlyn Lot MD      . metroNIDAZOLE (FLAGYL) IVPB 500 mg  500 mg Intravenous QShanda Howells MD 100 mL/hr at 02/18/18 0823 500 mg at 02/18/18 0823  . ondansetron (ZOFRAN) tablet 4 mg  4 mg Oral Q6H PRN WLoletha Grayer MD       Or  . ondansetron (ZOFRAN) injection 4 mg  4 mg Intravenous Q6H PRN Wieting, Richard, MD      .  oxyCODONE (Oxy IR/ROXICODONE) immediate release tablet 5 mg  5 mg Oral Q6H PRN WLoletha Grayer MD       Current Outpatient Medications  Medication Sig Dispense Refill  . apixaban (ELIQUIS) 2.5 MG TABS tablet Take 1 tablet (2.5 mg total) by mouth 2 (two) times daily. 60 tablet 0  . aspirin EC 81 MG tablet Take 1 tablet (81 mg total) by mouth daily.    .Marland Kitchenatorvastatin (LIPITOR) 80 MG tablet Take 80 mg by mouth at bedtime.     . calcitRIOL (ROCALTROL) 0.5 MCG capsule Take 0.5 mcg by mouth daily.    . carvedilol (COREG) 3.125 MG tablet Take 3.125 mg by mouth 2 (two) times daily.     .Marland Kitchendocusate sodium (COLACE) 100 MG capsule Take 1 capsule (100 mg total) by mouth 2 (two) times daily. 10 capsule 0  . folic acid (FOLVITE) 1 MG tablet Take 1 tablet (1 mg total) by mouth daily. 30 tablet 3  . insulin glargine (LANTUS) 100 unit/mL SOPN Inject 0.08 mLs (8 Units total) into the skin at bedtime. 15 mL 11  . isosorbide mononitrate (IMDUR) 30 MG 24 hr tablet Take 30 mg by mouth daily.     . midodrine (PROAMATINE) 10 MG tablet Take 1 tablet (10 mg total) by mouth 3 (three) times a week. (Patient taking differently: Take 10 mg by mouth  every Monday, Wednesday, and Friday. ) 30 tablet 0  . mirtazapine (REMERON) 7.5 MG tablet Take 7.5 mg by mouth at bedtime.    . Multiple Vitamin (MULTIVITAMIN WITH MINERALS) TABS tablet Take 2 tablets by mouth daily. Gummy vitamins    . Multiple Vitamins-Minerals (PRORENAL + D) TABS Take 1 tablet by mouth daily.    . nitroGLYCERIN (NITROSTAT) 0.4 MG SL tablet Place 1 tablet (0.4 mg total) under the tongue every 5 (five) minutes as needed for chest pain. 30 tablet 0  . nizatidine (AXID) 150 MG capsule Take 1 capsule (150 mg total) by mouth 2 (two) times daily. 30 capsule 0  . Nutritional Supplements (FEEDING SUPPLEMENT, NEPRO CARB STEADY,) LIQD Take 237 mLs by mouth 2 (two) times daily between meals. 237 mL 0  . Omega-3 300 MG CAPS Take 2 capsules by mouth daily.    . ondansetron  (ZOFRAN) 4 MG tablet Take 4 mg by mouth every 8 (eight) hours as needed for nausea or vomiting.    Marland Kitchen oxyCODONE (OXY IR/ROXICODONE) 5 MG immediate release tablet Take 1 tablet (5 mg total) by mouth every 6 (six) hours as needed for moderate pain or severe pain. 30 tablet 0  . sevelamer carbonate (RENVELA) 800 MG tablet Take 800 mg by mouth 3 (three) times daily with meals.    . vitamin B-12 (CYANOCOBALAMIN) 1000 MCG tablet Take 1 tablet (1,000 mcg total) by mouth daily. 30 tablet 0  . ceFAZolin (ANCEF) IVPB Indication:  Osteomyelitis/Tricuspic Endocarditis Last Day of Therapy:  03/13/18 Labs - Once weekly:  CBC/D and CMP, 12 Units 0  . ceFAZolin (ANCEF) IVPB Inject 2 g into the vein every Tuesday with hemodialysis AND 2 g every Thursday with hemodialysis AND 3 g every Saturday with hemodialysis. Do all this for 24 days. Indication:  MSSA bacteremia Last Day of Therapy:  03/13/18 Labs - Once weekly:  CBC/D and BMP, Labs - Every other week:  ESR and CRP. 11 Units 0    Past Medical History:  Diagnosis Date  . (HFpEF) heart failure with preserved ejection fraction ([place])    a. 01/2018 Echo: EF 50-55%, no rwma, Gr1 DD, triv AI, mild MR. Midly dil LA/RV, mod reduced RV fxn. Irreg thickening of TV w/ mobile echodensity that appears to arise from valve.  . Anemia   . Anorexia   . Bacteremia due to Pseudomonas   . Carotid arterial disease ([place])    a. 01/2017 Carotid U/S: RICA <86, LICA <76.  Marland Kitchen Complication of anesthesia    a. Pt reports h/o complication on 9 different occasions - ? hypotension/arrest.  . 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 01/2015 Cath [facility]): 70% LM, p-mLAD 50-60% (Resting FFR 0.75), mRCA 80-90%, ~40 Ost OM & D1-->CABG; b. 01/2016 Staged PCI of LCX x 2 and RCA.  Marland Kitchen ESRD (end stage renal disease) on dialysis ([place])    a. ESRD secondary to acute kidney failure s/p CABG-->PD  . Essential hypertension   . GERD (gastroesophageal reflux disease)   .  Heart murmur   . Hypercholesterolemia   . Myocardial infarction ([place]) 2016  . PVD (peripheral vascular disease) ([place])    a. 01/30/2018 PV Angio: Sev Left Popliteal dzs s/p PTA w/ 66m lutonix DEB w/ mech aspiration of the L popliteal, L AT, and Left tibioperoneal trunck and peroneal artery.  . 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  [facility]: Dr. BMarland KitchenHaithcock: CABG x 3, LIMA to LAD, SVG to  RCA, SVG to OM3, EVH  . Sinusitis 2019  . Type II diabetes mellitus with complication (HCC)    CAD    Past Surgical History:  Procedure Laterality Date  . ABDOMINAL HYSTERECTOMY    . AMPUTATION TOE Left 02/06/2018   Procedure: AMPUTATION GREAT TOE;  Surgeon: Samara Deist, DPM;  Location: [facility];  Service: Podiatry;  Laterality: Left;  . ARTERIOVENOUS GRAFT PLACEMENT  05/2016  . AV FISTULA PLACEMENT Left 05/30/2016   Procedure: ARTERIOVENOUS graft;  Surgeon: Algernon Huxley, MD;  Location: [facility];  Service: Vascular;  Laterality: Left;  . CAPD INSERTION N/A 10/30/2017   Procedure: LAPAROSCOPIC INSERTION CONTINUOUS AMBULATORY PERITONEAL DIALYSIS  (CAPD) CATHETER;  Surgeon: Algernon Huxley, MD;  Location: [facility];  Service: Vascular;  Laterality: N/A;  . CARDIAC CATHETERIZATION  01/2015   [facility]: Ost LM 70%, p-m LAD 50-60% (Rest FFR + @ 0.75), mRCA 80-90%, ostD1 40%, pOM1 40%  . CATARACT EXTRACTION W/PHACO Left 01/18/2016   Procedure: CATARACT EXTRACTION PHACO AND INTRAOCULAR LENS PLACEMENT (IOC);  Surgeon: Eulogio Bear, MD;  Location: [facility];  Service: Ophthalmology;  Laterality: Left;  [place] 1.05 AP% 15.5 CDE 10.16 Fluid Pack Lot # [id number]  . CATARACT EXTRACTION W/PHACO Right 08/01/2016   Procedure: CATARACT EXTRACTION PHACO AND INTRAOCULAR LENS PLACEMENT (IOC);  Surgeon: Eulogio Bear, MD;  Location: [facility];  Service: Ophthalmology;  Laterality: Right;  [place] 01:00.6 AP% 11.4 CDE 6.93  LOT # [id number]  . COLONOSCOPY    . CORONARY ANGIOPLASTY     SENTS 02/12/16  . CORONARY ARTERY BYPASS GRAFT  03/28/15     [facility]: Dr. Waldemar Dickens: LIMA to LAD, SVG to RCA, SVG to OM3, EVH  . DIALYSIS/PERMA CATHETER INSERTION N/A 02/11/2018   Procedure: DIALYSIS/PERMA CATHETER INSERTION;  Surgeon: Algernon Huxley, MD;  Location: [facility];  Service: Cardiovascular;  Laterality: N/A;  . DIALYSIS/PERMA CATHETER REMOVAL Right 02/04/2018   Procedure: DIALYSIS/PERMA CATHETER REMOVAL;  Surgeon: Katha Cabal, MD;  Location: [place] [facility];  Service: Cardiovascular;  Laterality: Right;  . ESOPHAGOGASTRODUODENOSCOPY (EGD) WITH PROPOFOL N/A 10/24/2016   Procedure: ESOPHAGOGASTRODUODENOSCOPY (EGD) WITH PROPOFOL;  Surgeon: [name], MD;  Location: [facility];  Service: Endoscopy;  Laterality: N/A;  . EYE SURGERY Bilateral    cataract surgery  . EYE SURGERY     drains for glaucoma  . INSERTION EXPRESS TUBE SHUNT Right 08/01/2016   Procedure: INSERTION EXPRESS TUBE SHUNT;  Surgeon: Eulogio Bear, MD;  Location: [facility];  Service: Ophthalmology;  Laterality: Right;  . INSERTION OF AHMED VALVE Left 08/15/2016   Procedure: INSERTION OF AHMED VALVE;  Surgeon: Eulogio Bear, MD;  Location: [facility];  Service: Ophthalmology;  Laterality: Left;  . INSERTION OF DIALYSIS CATHETER    . LOWER EXTREMITY ANGIOGRAPHY Left 01/30/2018   Procedure: Lower Extremity Angiography;  Surgeon: Katha Cabal, MD;  Location: [place] [facility];  Service: Cardiovascular;  Laterality: Left;  . TEMPORARY DIALYSIS CATHETER N/A 02/06/2018   Procedure: TEMPORARY DIALYSIS CATHETER;  Surgeon: Katha Cabal, MD;  Location: [place] [facility];  Service: Cardiovascular;  Laterality: N/A;  . TRANSTHORACIC ECHOCARDIOGRAM  January 2017    EF 60-65%. GR 2 DD. Mild degenerative mitral valve disease but no prolapse or regurgitation. Mild left atrial dilation. Mild to moderate LVH. Pericardial effusion gone  . UPPER EXTREMITY ANGIOGRAPHY Left 06/27/2016   Procedure: Upper Extremity Angiography;  Surgeon: Algernon Huxley, MD;   Location: [facility];  Service:  Cardiovascular;  Laterality: Left;  . UPPER EXTREMITY ANGIOGRAPHY Left 08/22/2016   Procedure: Upper Extremity Angiography;  Surgeon: Algernon Huxley, MD;  Location: [place] [facility];  Service: Cardiovascular;  Laterality: Left;    Social History Social History   Tobacco Use  . Smoking status: Never Smoker  . Smokeless tobacco: Never Used  Substance Use Topics  . Alcohol use: No  . Drug use: No    Family History Family History  Problem Relation Age of Onset  . Diabetes Mellitus II Mother   . Pancreatic cancer Father   No bleeding disorders, clotting disorders, porphyria, or aneurysms  Allergies  Allergen Reactions  . Chlorthalidone Anaphylaxis, Itching and Rash  . Fentanyl Rash  . Midazolam Rash  . Ace Inhibitors Other (See Comments)    Reaction:  Hyperkalemia, agitation   . Angiotensin Receptor Blockers Other (See Comments)    Reaction:  Hyperkalemia, agitation   . Norvasc [Amlodipine] Itching and Rash  . Phenergan [Promethazine Hcl] Anxiety    "antsy, can't sit still"     REVIEW OF SYSTEMS (Negative unless checked)  Constitutional: _0 Weight loss  _1 Fever  _2 Chills Cardiac: _3 Chest pain   _4 Chest pressure   _5 Palpitations   _6 Shortness of breath when laying flat   _7 Shortness of breath at rest   _8 Shortness of breath with exertion. Vascular:  _9 Pain in legs with walking   _10 Pain in legs at rest   _11 Pain in legs when laying flat   _12 Claudication   _13 Pain in feet when walking  _14 Pain in feet at rest  _15 Pain in feet when laying flat   _16 History of DVT   _17 Phlebitis   _18 Swelling in legs   _19 Varicose veins   _20 Non-healing ulcers Pulmonary:   _21 Uses home oxygen   _22 Productive cough   _23 Hemoptysis   _24 Wheeze  _25 COPD   _26 Asthma Neurologic:  _27 Dizziness  _28 Blackouts   _29 Seizures   _30 History of stroke   _31 History of TIA  _32 Aphasia   _33 Temporary blindness   _34 Dysphagia   _35 Weakness or numbness in arms   _36 Weakness or numbness in  legs Musculoskeletal:  _37 Arthritis   _38 Joint swelling   _39 Joint pain   _40 Low back pain Hematologic:  _41 Easy bruising  _42 Easy bleeding   _43 Hypercoagulable state   _44 Anemic  _45 Hepatitis Gastrointestinal:  _46 Blood in stool   _47 Vomiting blood  _48 Gastroesophageal reflux/heartburn   _49 Difficulty swallowing. Genitourinary:  _50 Chronic kidney disease   _51 Difficult urination  _52 Frequent urination  _53 Burning with urination   _54 Blood in urine Skin:  _55 Rashes   _56 Ulcers   _57 Wounds Psychological:  _58 History of anxiety   _59  History of major depression.  Physical Examination  Vitals:   02/18/18 0740 02/18/18 0800 02/18/18 0830 02/18/18 0900  BP:  (!) 155/68 (!) 147/62 (!) 148/57  Pulse:  79 78 75  Resp:  _60 Temp:      TempSrc:      SpO2:  92% 93% 97%  Weight: 61 kg     Height: _61  (1.626 m)      Body mass index is 23.08 kg/m. Gen:  WD/WN, NAD Head: [place]/AT, No temporalis wasting.  Ear/Nose/Throat: Hearing grossly intact, nares w/o erythema or drainage, oropharynx w/o Erythema/Exudate Eyes: Sclera non-icteric, conjunctiva clear Neck: Trachea midline.  No JVD.  Pulmonary:  Good air movement, respirations not labored, equal bilaterally.  Cardiac: irregular with murmur Vascular:  Vessel Right Left  Radial Palpable Palpable  PT Not Palpable Not Palpable  DP Not Palpable Not Palpable   Gastrointestinal: soft, non-tender/non-distended.   Musculoskeletal: M/S 5/5 throughout.  Extremities with bilateral lower extremity ischemic changes.   Neurologic: Sensation grossly intact in extremities.  Symmetrical.  Speech is fluent. Motor exam as listed above. Psychiatric: Judgment and insight appear to be fair.  Somewhat flat affect Dermatologic: Fixed skin changes of all the toes on the right foot, the plantar aspect of the foot, and extending all the way up across the heel.  There also fixed skin changes of the left forefoot up around the ankle and in the pretibial  location on the left leg.  The left great toe amputation site is covered.      CBC Lab Results  Component Value Date   WBC 16.6 (H) 02/18/2018   HGB 8.0 (L) 02/18/2018   HCT 27.9 (L) 02/18/2018   MCV 103.3 (H) 02/18/2018   PLT 403 (H) 02/18/2018    BMET    Component Value Date/Time   NA 139 02/18/2018 0756   K 4.4 02/18/2018 0756   CL 97 (L) 02/18/2018 0756   CO2 29 02/18/2018 0756   GLUCOSE 87 02/18/2018 0756   BUN 27 (H) 02/18/2018 0756   CREATININE 5.23 (H) 02/18/2018 0756   CALCIUM 8.7 (L) 02/18/2018 0756   GFRNONAA 8 (L) 02/18/2018 0756   GFRAA 9 (L) 02/18/2018 0756   Estimated Creatinine Clearance: 9.1 mL/min (A) (by C-G formula based on SCr of 5.23 mg/dL (H)).  COAG Lab Results  Component Value Date   INR 1.27 02/18/2018   INR 1.14 01/29/2018   INR 1.16 10/30/2017    Radiology Dg Tibia/fibula Left  Result Date: 01/29/2018 CLINICAL DATA:  Presents with ED via ems from home Having pain to both lower legs But states left leg is worse Increased pain with palpation Decreased pulses to foot EXAM: LEFT TIBIA AND FIBULA - 2 VIEW COMPARISON:  None. FINDINGS: No fracture.  No bone lesion. Knee and ankle joints are normally aligned. There are arterial vascular calcifications. Soft tissues are otherwise unremarkable. IMPRESSION: Negative Electronically Signed   By: Lajean Manes M.D.   On: 01/29/2018 20:58   Ct Chest W Contrast  Result Date: 02/12/2018 CLINICAL DATA:  Recent placement of PermCath with an episode of hemoptysis. Patient also presenting with lethargy and signs of sepsis. EXAM: CT CHEST WITH CONTRAST TECHNIQUE: Multidetector CT imaging of the chest was performed during intravenous contrast administration. CONTRAST:  36m OMNIPAQUE IOHEXOL 300 MG/ML  SOLN COMPARISON:  Chest radiograph 02/11/2018 FINDINGS: Cardiovascular: Dual lumen central venous catheter from left IJ approach terminates in the right atrium. There is a small amount of subcutaneous emphysema  adjacent to the skin entrance site. No perivascular hematoma is seen along the catheter course. Enlarged cardiac silhouette. Heavy calcific atherosclerotic disease of the coronary arteries and aorta. Post CABG postsurgical changes. Mediastinum/Nodes: No evidence of lymphadenopathy. 12 mm circumscribed hypoattenuated left thyroid nodule. Lungs/Pleura: No evidence of pneumothorax or pleural effusion. Linear airspace opacities in bilateral lower lungs may represent atelectasis versus scarring. Punctate calcifications are noted along bilateral pleural surfaces. Upper Abdomen: Scattered calcifications throughout the spleen. Punctate vascular calcifications versus small nonobstructive calculi in bilateral kidneys. Musculoskeletal: No chest wall abnormality. No acute or significant osseous findings. IMPRESSION: Dual lumen left IJ approach central venous catheter terminates in the right atrium. Small amount of subcutaneous emphysema adjacent to the skin entrance site, expected postprocedural finding. No evidence of perivascular hematoma. No evidence of pneumothorax. Bilateral lower lobes  pulmonary scarring versus atelectasis. Scattered calcifications along the pleural surfaces in bilateral lower lobes may represent evidence of prior granulomatous disease or asbestos exposure. Heavy calcific atherosclerotic disease of the aorta and coronary arteries. Postsurgical changes of CABG. Cardiomegaly. 12 mm left thyroid nodule. If further imaging is desired, dedicated thyroid ultrasound may be considered. Aortic Atherosclerosis (ICD10-I70.0). Electronically Signed   By: Fidela Salisbury M.D.   On: 02/12/2018 11:13   US Venous Img Lower Unilateral Left  Result Date: 01/29/2018 CLINICAL DATA:  Left leg pain EXAM: LEFT LOWER EXTREMITY VENOUS DOPPLER ULTRASOUND TECHNIQUE: Gray-scale sonography with graded compression, as well as color Doppler and duplex ultrasound were performed to evaluate the lower extremity deep venous  systems from the level of the common femoral vein and including the common femoral, femoral, profunda femoral, popliteal and calf veins including the posterior tibial, peroneal and gastrocnemius veins when visible. The superficial great saphenous vein was also interrogated. Spectral Doppler was utilized to evaluate flow at rest and with distal augmentation maneuvers in the common femoral, femoral and popliteal veins. COMPARISON:  None. FINDINGS: Contralateral Common Femoral Vein: Respiratory phasicity is normal and symmetric with the symptomatic side. No evidence of thrombus. Normal compressibility. Common Femoral Vein: No evidence of thrombus. Normal compressibility, respiratory phasicity and response to augmentation. Saphenofemoral Junction: No evidence of thrombus. Normal compressibility and flow on color Doppler imaging. Profunda Femoral Vein: No evidence of thrombus. Normal compressibility and flow on color Doppler imaging. Femoral Vein: No evidence of thrombus. Normal compressibility, respiratory phasicity and response to augmentation. Popliteal Vein: No evidence of thrombus. Normal compressibility, respiratory phasicity and response to augmentation. Calf Veins: No evidence of thrombus. Normal compressibility and flow on color Doppler imaging. Superficial Great Saphenous Vein: No evidence of thrombus. Normal compressibility. Venous Reflux:  None. Other Findings:  None. IMPRESSION: No evidence of deep venous thrombosis. Electronically Signed   By: Rolm Baptise M.D.   On: 01/29/2018 18:36   Dg Chest Port 1 View  Result Date: 02/11/2018 CLINICAL DATA:  Hemoptysis EXAM: PORTABLE CHEST 1 VIEW COMPARISON:  10/15/2017 FINDINGS: Left IJ dialysis catheter tips proximal mid right atrium. Stable cardiomegaly without CHF. Chronic left basilar scarring as before with blunting of left costophrenic angle. No definite superimposed acute process or CHF. No pneumothorax. Trachea is midline. Previous median sternotomy  noted. Aorta atherosclerotic. Degenerative changes noted of the spine. IMPRESSION: Stable cardiomegaly and left basilar scarring. No interval change or superimposed acute process. Electronically Signed   By: Jerilynn Mages.  Shick M.D.   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 Diabetes and end-stage renal disease. EXAM: RIGHT FOOT COMPLETE - 3+ VIEW COMPARISON:  None. FINDINGS: The bones appear demineralized. There is no evidence of acute fracture, dislocation or bone destruction. There is an old healed fracture of the distal 5th metatarsal shaft. There is moderate joint space narrowing, osteophyte formation and mild hallux valgus deformity of the 1st MTP joint. There are lobulated calcifications within the 1st web space which could reflect hydroxyapatite deposition. Prominent vascular calcifications are noted. There is an additional lobular calcification in the plantar aspect of the hindfoot adjacent to the plantar artery which could reflect a small aneurysm. IMPRESSION: 1. No acute osseous findings or radiographic evidence of osteomyelitis. 2. 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 Additional calcification in the 1st web space could be vascular or hydroxyapatite deposition. Electronically Signed   By: Gwyndolyn Saxon  Lin Landsman M.D.   On: 02/18/2018 09:33   Dg Toe Great Left  Result Date: 01/29/2018 CLINICAL DATA:  Open wound to the left great toe for several months. EXAM: LEFT GREAT TOE COMPARISON:  10/07/2017 FINDINGS: Degenerative changes in the first metatarsal-phalangeal joint. Soft tissue defect over the plantar aspect of the left first toe. There is underlying erosion of the distal phalangeal tuft cortex suggesting focal osteomyelitis. No evidence of acute fracture or dislocation. Diffuse bone demineralization. Prominent vascular calcifications. IMPRESSION: Soft tissue defect over the plantar  aspect of the left first toe with underlying erosion of the distal phalangeal tuft suggesting osteomyelitis. Electronically Signed   By: Lucienne Capers M.D.   On: 01/29/2018 21:30      Assessment/Plan 1.  Gangrene of both feet.  The right foot has started to demarcate on the plantar surface of the foot across the heel and involving all the toes.  The left foot also has fixed skin changes up across the ankle and in the pretibial location as well.  I think the likely outcome is going to be bilateral lower extremity amputations at either a below-knee or above-knee level.  Given her multiple comorbidities including long-standing renal failure, her wound healing potential is extremely poor.  We will perform a right lower extremity angiogram tomorrow to evaluate her perfusion and see if this can be improved.  Even with revascularization, I think limb loss is likely 2.  Endocarditis.  Status post septic embolization several weeks ago treated with revascularization.  The flow to her left leg and foot is markedly improved, but the gangrenous changes have improved.  This is a very difficult situation and I long-standing dialysis patient.  On antibiotics. 3.  End-stage renal disease.  Access is currently working reasonably well.  This is a major comorbidity and increases the complexity of wound healing and any procedures performed.  Very difficult situation. 4.  Diabetes. blood glucose control important in reducing the progression of atherosclerotic disease. Also, involved in wound healing. On appropriate medications.    Leotis Pain, MD  02/18/2018 10:00 AM    This note was created with Dragon medical transcription system.  Any error is purely unintentional

## 2018-02-18 NOTE — Consult Note (Signed)
[facility]                                                      Patient Demographics  Stephanie Ellis, is a 67 y.o. female   MRN: [id number]   DOB - 1950-08-04  Admit Date - 02/18/2018    Outpatient Primary MD for the patient is System, Badger Lee Not In  Consult requested in the Hospital by Loletha Grayer, MD, On 02/18/2018    Reason for consult recheck on gangrenous left foot and a new problem of gangrene to the right foot.   With History of -  Past Medical History:  Diagnosis Date  . (HFpEF) heart failure with preserved ejection fraction ([place])    a. 01/2018 Echo: EF 50-55%, no rwma, Gr1 DD, triv AI, mild MR. Midly dil LA/RV, mod reduced RV fxn. Irreg thickening of TV w/ mobile echodensity that appears to arise from valve.  . Anemia   . Anorexia   . Bacteremia due to Pseudomonas   . Carotid arterial disease ([place])    a. 01/2017 Carotid U/S: RICA <29, LICA <56.  Marland Kitchen Complication of anesthesia    a. Pt reports h/o complication on 9 different occasions - ? hypotension/arrest.  . 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 01/2015 Cath [facility]): 70% LM, p-mLAD 50-60% (Resting FFR 0.75), mRCA 80-90%, ~40 Ost OM & D1-->CABG; b. 01/2016 Staged PCI of LCX x 2 and RCA.  Marland Kitchen ESRD (end stage renal disease) on dialysis ([place])    a. ESRD secondary to acute kidney failure s/p CABG-->PD  . Essential hypertension   . GERD (gastroesophageal reflux disease)   . Heart murmur   . Hypercholesterolemia   . Myocardial infarction ([place]) 2016  . PVD (peripheral vascular disease) ([place])    a. 01/30/2018 PV Angio: Sev Left Popliteal dzs s/p PTA w/ 8m lutonix DEB w/ mech aspiration of the L popliteal, L AT, and Left tibioperoneal trunck and peroneal artery.  . 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  [facility]: Dr. BMarland KitchenHaithcock: CABG x 3, LIMA to LAD, SVG to RCA,  SVG to OM3, EVH  . Sinusitis 2019  . Type II diabetes mellitus with complication (HCC)    CAD      Past Surgical History:  Procedure Laterality Date  . ABDOMINAL HYSTERECTOMY    . AMPUTATION TOE Left 02/06/2018   Procedure: AMPUTATION GREAT TOE;  Surgeon: FSamara Deist DPM;  Location: [facility] OR[facility];  Service: Podiatry;  Laterality: Left;  . ARTERIOVENOUS GRAFT PLACEMENT  05/2016  . AV FISTULA PLACEMENT Left 05/30/2016   Procedure: ARTERIOVENOUS graft;  Surgeon: JAlgernon Huxley MD;  Location: [facility];  Service: Vascular;  Laterality: Left;  . CAPD INSERTION N/A 10/30/2017   Procedure: LAPAROSCOPIC INSERTION CONTINUOUS AMBULATORY PERITONEAL DIALYSIS  (CAPD) CATHETER;  Surgeon: DAlgernon Huxley MD;  Location: [facility];  Service: Vascular;  Laterality: N/A;  . CARDIAC CATHETERIZATION  01/2015   [facility]: Ost LM 70%, p-m LAD 50-60% (Rest FFR + @ 0.75), mRCA 80-90%, ostD1 40%, pOM1 40%  . CATARACT EXTRACTION W/PHACO Left 01/18/2016   Procedure: CATARACT EXTRACTION PHACO AND INTRAOCULAR LENS PLACEMENT (IOC);  Surgeon: BEulogio Bear MD;  Location: [facility];  Service: Ophthalmology;  Laterality: Left;  [place]1.05 AP% 15.5 CDE 10.16 Fluid Pack Lot # [id number]  .  CATARACT EXTRACTION W/PHACO Right 08/01/2016   Procedure: CATARACT EXTRACTION PHACO AND INTRAOCULAR LENS PLACEMENT (IOC);  Surgeon: Eulogio Bear, MD;  Location: [facility];  Service: Ophthalmology;  Laterality: Right;  [place] 01:00.6 AP% 11.4 CDE 6.93  LOT # [id number]  . COLONOSCOPY    . CORONARY ANGIOPLASTY     SENTS 02/12/16  . CORONARY ARTERY BYPASS GRAFT  03/28/15    [facility]: Dr. Waldemar Dickens: LIMA to LAD, SVG to RCA, SVG to OM3, EVH  . DIALYSIS/PERMA CATHETER INSERTION N/A 02/11/2018   Procedure: DIALYSIS/PERMA CATHETER INSERTION;  Surgeon: Algernon Huxley, MD;  Location: [place] [facility];  Service: Cardiovascular;  Laterality: N/A;  . DIALYSIS/PERMA CATHETER REMOVAL Right 02/04/2018   Procedure: DIALYSIS/PERMA CATHETER REMOVAL;  Surgeon: Katha Cabal, MD;  Location: [place] [facility];  Service: Cardiovascular;  Laterality: Right;  . ESOPHAGOGASTRODUODENOSCOPY (EGD) WITH PROPOFOL N/A 10/24/2016   Procedure: ESOPHAGOGASTRODUODENOSCOPY (EGD) WITH PROPOFOL;  Surgeon: [name], MD;  Location: [facility];  Service: Endoscopy;  Laterality: N/A;  . EYE SURGERY Bilateral    cataract surgery  . EYE SURGERY     drains for glaucoma  . INSERTION EXPRESS TUBE SHUNT Right 08/01/2016   Procedure: INSERTION EXPRESS TUBE SHUNT;  Surgeon: Eulogio Bear, MD;  Location: [facility];  Service: Ophthalmology;  Laterality: Right;  . INSERTION OF AHMED VALVE Left 08/15/2016   Procedure: INSERTION OF AHMED VALVE;  Surgeon: Eulogio Bear, MD;  Location: [facility];  Service: Ophthalmology;  Laterality: Left;  . INSERTION OF DIALYSIS CATHETER    . LOWER EXTREMITY ANGIOGRAPHY Left 01/30/2018   Procedure: Lower Extremity Angiography;  Surgeon: Katha Cabal, MD;  Location: [place] [facility];  Service: Cardiovascular;  Laterality: Left;  . TEMPORARY DIALYSIS CATHETER N/A 02/06/2018   Procedure: TEMPORARY DIALYSIS CATHETER;  Surgeon: Katha Cabal, MD;  Location: [place] [facility];  Service: Cardiovascular;  Laterality: N/A;  . TRANSTHORACIC ECHOCARDIOGRAM  January 2017    EF 60-65%. GR 2 DD. Mild degenerative mitral valve disease but no prolapse or regurgitation. Mild left atrial dilation. Mild to moderate LVH. Pericardial effusion gone  . UPPER EXTREMITY ANGIOGRAPHY Left 06/27/2016   Procedure: Upper Extremity Angiography;  Surgeon: Algernon Huxley, MD;  Location: [place] [facility];  Service: Cardiovascular;  Laterality: Left;  . UPPER EXTREMITY ANGIOGRAPHY Left 08/22/2016   Procedure: Upper Extremity Angiography;  Surgeon: Algernon Huxley, MD;  Location: [place] [facility];  Service: Cardiovascular;  Laterality: Left;    in for   Chief Complaint  Patient presents with  . Wound Check     HPI  Stephanie Ellis  is a 67 y.o. female,  patient has been hospitalized several times recently because of peripheral arterial disease.  She was hospitalized 3 weeks ago when I evaluated her in my partner Dr. Vickki Muff amputated a gangrenous left great toe.  There is also gangrenous changes on the dorsum of the foot at that time frame.  She was readmitted to the hospital with significant ischemic changes to the right lower extremity as well.    Review of Systems: Patient is alert and responsive to questions she is accompanied by 2 family members.  In addition to the HPI above,  No Fever-chills, No Headache, No changes with Vision or hearing, No problems swallowing food or Liquids, No Chest pain, Cough or Shortness of Breath, No Abdominal pain, No Nausea or Vommitting, Bowel movements are regular, No Blood in stool or Urine, No dysuria, No new skin  rashes or bruises, No new joints pains-aches,  Ischemic changes to the right lower extremity as compared to previous hospitalization No recent weight gain or loss, No polyuria, polydypsia or polyphagia,   A full 10 point Review of Systems was done, except as stated above, all other Review of Systems were negative.   Social History Social History   Tobacco Use  . Smoking status: Never Smoker  . Smokeless tobacco: Never Used  Substance Use Topics  . Alcohol use: No    Family History Family History  Problem Relation Age of Onset  . Diabetes Mellitus II Mother   . Heart failure Mother   . Pancreatic cancer Father     Prior to Admission medications   Medication Sig Start Date End Date Taking? Authorizing Provider  apixaban (ELIQUIS) 2.5 MG TABS tablet Take 1 tablet (2.5 mg total) by mouth 2 (two) times daily. 02/15/18  Yes Vaughan Basta, MD  aspirin EC 81 MG tablet Take 1 tablet (81 mg total) by mouth daily. 10/19/16  Yes Dustin Flock, MD  atorvastatin (LIPITOR) 80 MG tablet Take 80 mg by mouth at bedtime.  05/05/17  Yes [provider]  calcitRIOL  (ROCALTROL) 0.5 MCG capsule Take 0.5 mcg by mouth daily.   Yes [provider]  carvedilol (COREG) 3.125 MG tablet Take 3.125 mg by mouth 2 (two) times daily.    Yes [provider]  docusate sodium (COLACE) 100 MG capsule Take 1 capsule (100 mg total) by mouth 2 (two) times daily. 02/15/18  Yes Vaughan Basta, MD  folic acid (FOLVITE) 1 MG tablet Take 1 tablet (1 mg total) by mouth daily. 02/15/18 02/15/19 Yes Vaughan Basta, MD  insulin glargine (LANTUS) 100 unit/mL SOPN Inject 0.08 mLs (8 Units total) into the skin at bedtime. 02/15/18  Yes Vaughan Basta, MD  isosorbide mononitrate (IMDUR) 30 MG 24 hr tablet Take 30 mg by mouth daily.    Yes [provider]  midodrine (PROAMATINE) 10 MG tablet Take 1 tablet (10 mg total) by mouth 3 (three) times a week. Patient taking differently: Take 10 mg by mouth every Monday, Wednesday, and Friday.  02/17/18  Yes Vaughan Basta, MD  mirtazapine (REMERON) 7.5 MG tablet Take 7.5 mg by mouth at bedtime.   Yes [provider]  Multiple Vitamin (MULTIVITAMIN WITH MINERALS) TABS tablet Take 2 tablets by mouth daily. Gummy vitamins   Yes [provider]  Multiple Vitamins-Minerals (PRORENAL + D) TABS Take 1 tablet by mouth daily.   Yes [provider]  nitroGLYCERIN (NITROSTAT) 0.4 MG SL tablet Place 1 tablet (0.4 mg total) under the tongue every 5 (five) minutes as needed for chest pain. 09/05/16  Yes Wieting, Richard, MD  nizatidine (AXID) 150 MG capsule Take 1 capsule (150 mg total) by mouth 2 (two) times daily. 02/15/18  Yes Vaughan Basta, MD  Nutritional Supplements (FEEDING SUPPLEMENT, NEPRO CARB STEADY,) LIQD Take 237 mLs by mouth 2 (two) times daily between meals. 02/15/18  Yes Vaughan Basta, MD  Omega-3 300 MG CAPS Take 2 capsules by mouth daily.   Yes [provider]  ondansetron (ZOFRAN) 4 MG tablet Take 4 mg by mouth every 8 (eight) hours as  needed for nausea or vomiting.   Yes [provider]  oxyCODONE (OXY IR/ROXICODONE) 5 MG immediate release tablet Take 1 tablet (5 mg total) by mouth every 6 (six) hours as needed for moderate pain or severe pain. 02/15/18  Yes Vaughan Basta, MD  sevelamer carbonate (RENVELA) 800 MG  tablet Take 800 mg by mouth 3 (three) times daily with meals.   Yes [provider]  vitamin B-12 (CYANOCOBALAMIN) 1000 MCG tablet Take 1 tablet (1,000 mcg total) by mouth daily. 02/15/18  Yes Vaughan Basta, MD  ceFAZolin (ANCEF) IVPB Indication:  Osteomyelitis/Tricuspic Endocarditis Last Day of Therapy:  03/13/18 Labs - Once weekly:  CBC/D and CMP, 02/17/18 03/13/18  Vaughan Basta, MD  ceFAZolin (ANCEF) IVPB Inject 2 g into the vein every Tuesday with hemodialysis AND 2 g every Thursday with hemodialysis AND 3 g every Saturday with hemodialysis. Do all this for 24 days. Indication:  MSSA bacteremia Last Day of Therapy:  03/13/18 Labs - Once weekly:  CBC/D and BMP, Labs - Every other week:  ESR and CRP. 02/17/18 03/13/18  Vaughan Basta, MD    Anti-infectives (From admission, onward)   Start     Dose/Rate Route Frequency Ordered Stop   02/19/18 1200  vancomycin (VANCOCIN) IVPB 500 mg/100 ml premix  Status:  Discontinued     500 mg 100 mL/hr over 60 Minutes Intravenous Every T-Th-Sa (Hemodialysis) 02/18/18 1338 02/18/18 1536   02/19/18 1200  ceFEPIme (MAXIPIME) 1 g in sodium chloride 0.9 % 100 mL IVPB     1 g 200 mL/hr over 30 Minutes Intravenous Every 24 hours 02/18/18 1338     02/19/18 1200  vancomycin (VANCOCIN) 500 mg in sodium chloride 0.9 % 500 mL IVPB  Status:  Discontinued     500 mg 250 mL/hr over 120 Minutes Intravenous Every T-Th-Sa (Hemodialysis) 02/18/18 1537 02/18/18 1547   02/19/18 1200  vancomycin (VANCOCIN) 500 mg in sodium chloride 0.9 % 100 mL IVPB     500 mg 100 mL/hr over 60 Minutes Intravenous Every T-Th-Sa (Hemodialysis) 02/18/18 1547      02/18/18 1330  vancomycin (VANCOCIN) 500 mg in sodium chloride 0.9 % 100 mL IVPB     500 mg 100 mL/hr over 60 Minutes Intravenous  Once 02/18/18 1320 02/18/18 1554   02/18/18 0800  ceFEPIme (MAXIPIME) 2 g in sodium chloride 0.9 % 100 mL IVPB     2 g 200 mL/hr over 30 Minutes Intravenous  Once 02/18/18 0748 02/18/18 0847   02/18/18 0800  metroNIDAZOLE (FLAGYL) IVPB 500 mg     500 mg 100 mL/hr over 60 Minutes Intravenous Every 8 hours 02/18/18 0748     02/18/18 0800  vancomycin (VANCOCIN) IVPB 1000 mg/200 mL premix     1,000 mg 200 mL/hr over 60 Minutes Intravenous  Once 02/18/18 0748 02/18/18 1003      Scheduled Meds: . [START ON 02/19/2018] aspirin EC  81 mg Oral Daily  . atorvastatin  80 mg Oral QHS  . [START ON 02/19/2018] calcitRIOL  0.5 mcg Oral Daily  . carvedilol  3.125 mg Oral BID WC  . docusate sodium  100 mg Oral BID  . famotidine  10 mg Oral Daily  . feeding supplement (NEPRO CARB STEADY)  237 mL Oral BID BM  . folic acid  1 mg Oral Daily  . insulin aspart  0-5 Units Subcutaneous QHS  . insulin aspart  0-9 Units Subcutaneous TID WC  . insulin glargine  4 Units Subcutaneous QHS  . [START ON 02/19/2018] isosorbide mononitrate  30 mg Oral Daily  . midodrine  10 mg Oral Q M,W,F  . mirtazapine  7.5 mg Oral QHS  . multivitamin with minerals  2 tablet Oral Daily  . omega-3 acid ethyl esters  1 capsule Oral Daily  . sevelamer carbonate  800  mg Oral TID WC  . vitamin B-12  1,000 mcg Oral Daily   Continuous Infusions: . [START ON 02/19/2018] ceFEPime (MAXIPIME) IV    . heparin 1,050 Units/hr (02/18/18 1700)  . metronidazole 100 mL/hr at 02/18/18 1700  . [START ON 02/19/2018] vancomycin     PRN Meds:.acetaminophen **OR** acetaminophen, nitroGLYCERIN, ondansetron **OR** ondansetron (ZOFRAN) IV, oxyCODONE  Allergies  Allergen Reactions  . Chlorthalidone Anaphylaxis, Itching and Rash  . Fentanyl Rash  . Midazolam Rash  . Ace Inhibitors Other (See Comments)    Reaction:   Hyperkalemia, agitation   . Angiotensin Receptor Blockers Other (See Comments)    Reaction:  Hyperkalemia, agitation   . Norvasc [Amlodipine] Itching and Rash  . Phenergan [Promethazine Hcl] Anxiety    "antsy, can't sit still"    Physical Exam  Vitals  Blood pressure 138/73, pulse 83, temperature 98.3 F (36.8 C), temperature source Oral, resp. rate 17, height 5' 4" (1.626 m), weight 61 kg, SpO2 98 %.  Lower Extremity exam:  Vascular: Patient has nonpalpable pulses to both feet.  She had an angioplasty done on the left lower extremity several weeks ago and Dr. Vickki Muff did a amputation of the great toe that time frame.  She was readmitted to the hospital because of severe gangrenous changes to the right foot and those are evident today.Right foot shows necrosis to all the toes with gangrenous and ischemic spread dorsally approximately two thirds the way up the dorsum of the foot.  This also extends plantarly on the right foot and extends all the way to and including the plantar heel.  Unfortunately with this level of necrosis that negates any hope of any type of plantar flap that would may be salvage with a transmetatarsal amputation but I think her level of necrosis is too extensive throughout the foot to be able to really salvage the right foot.  I think that she is scheduled to have an angioplasty on the right when tomorrow and will likely need a higher level amputation.  On the left foot the incision margin from her amputation site looks fairly stable to me.  There is a island of superficial necrosis on top of the foot but that may be a gradually slough and show some signs of improvement.  The second toe on the left foot however is showing some gangrenous changes at this timeframe.   Neurological: Patient does not seem to have a lot of pain with this but unable to uncomfortable for.  Ortho: Previous amputation to left hallux as noted above.  Gangrenous changes as noted above.  Data  Review  CBC Recent Labs  Lab 02/12/18 0801 02/15/18 0409 02/18/18 0756  WBC 17.2* 19.1* 16.6*  HGB 7.6* 6.5* 8.0*  HCT 26.4* 23.7* 27.9*  PLT 307 347 403*  MCV 104.3* 108.7* 103.3*  MCH 30.0 29.8 29.6  MCHC 28.8* 27.4* 28.7*  RDW 22.3* 22.5* 22.1*  LYMPHSABS  --   --  1.6  MONOABS  --   --  1.0  EOSABS  --   --  0.2  BASOSABS  --   --  0.1   ------------------------------------------------------------------------------------------------------------------  Chemistries  Recent Labs  Lab 02/12/18 0801 02/15/18 0409 02/18/18 0756  NA 137  --  139  K 4.6  --  4.4  CL 92*  --  97*  CO2 29  --  29  GLUCOSE 155*  --  87  BUN 39*  --  27*  CREATININE 6.19* 4.07* 5.23*  CALCIUM 7.9*  --  8.7*  AST  --   --  22  ALT  --   --  <5  ALKPHOS  --   --  154*  BILITOT  --   --  0.4   ----------------- Assessment & Plan: Based on my clinical findings today do not think that the right foot is salvageable in any context.  I explained this to the patient and her family today.  I think they have an understanding of this and are realizing the likely need for higher level amputation. On the left foot however I think there is hope that she could get some further stabilization.  The plantar aspect of the foot appears to be fairly healthy.  Even if she ends up with necrosis dorsally and loss of the second toe we may consider a transmetatarsal amputation at some point may have a chance to heal as long circulation remained stable.  I explained this to the patient and her family as well. On the right foot will defer to Dr. Lucky Cowboy for definitive care.  Active Problems:   Ischemia of extremity   Family Communication: Plan discussed with patient and family  Albertine Patricia M.D on 02/18/2018 at 6:58 PM  Thank you for the consult, we will follow the patient with you in the Hospital.

## 2018-02-18 NOTE — H&P (Signed)
[facility] [address] NAME: Magdeline Prange    MR#:  [id number]  DATE OF BIRTH:  16-Dec-1950  DATE OF ADMISSION:  02/18/2018  PRIMARY CARE PHYSICIAN: System, Pcp Not In   REQUESTING/REFERRING PHYSICIAN: Dr Merlyn Lot  CHIEF COMPLAINT:   Chief Complaint  Patient presents with  . Wound Check    HISTORY OF PRESENT ILLNESS:  [place]  is a 67 y.o. 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  She was sent out to rehab on Eliquis.  She was brought back in with ischemia of the right foot and toes and also ischemia on the left foot and toe.  She does not complain of any pain there.  She states that she was walking with physical therapy.  She is a dialysis patient.  Hospitalist services were contacted for limb threatening ischemia.  ER physician spoke with Dr. Elvina Mattes and Dr. Lucky Cowboy.   PAST MEDICAL HISTORY:   Past Medical History:  Diagnosis Date  . (HFpEF) heart failure with preserved ejection fraction ([place])    a. 01/2018 Echo: EF 50-55%, no rwma, Gr1 DD, triv AI, mild MR. Midly dil LA/RV, mod reduced RV fxn. Irreg thickening of TV w/ mobile echodensity that appears to arise from valve.  . Anemia   . Anorexia   . Bacteremia due to Pseudomonas   . Carotid arterial disease ([place])    a. 01/2017 Carotid U/S: RICA <32, LICA <95.  Marland Kitchen Complication of anesthesia    a. Pt reports h/o complication on 9 different occasions - ? hypotension/arrest.  . 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 01/2015 Cath [facility]): 70% LM, p-mLAD 50-60% (Resting FFR 0.75), mRCA 80-90%, ~40 Ost OM & D1-->CABG; b. 01/2016 Staged PCI of LCX x 2 and RCA.  Marland Kitchen ESRD (end stage renal disease) on dialysis ([place])    a. ESRD secondary to acute kidney failure s/p CABG-->PD  . Essential hypertension   . GERD (gastroesophageal reflux disease)   . Heart murmur    . Hypercholesterolemia   . Myocardial infarction ([place]) 2016  . PVD (peripheral vascular disease) ([place])    a. 01/30/2018 PV Angio: Sev Left Popliteal dzs s/p PTA w/ 17m lutonix DEB w/ mech aspiration of the L popliteal, L AT, and Left tibioperoneal trunck and peroneal artery.  . 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  [facility]: Dr. BMarland KitchenHaithcock: CABG x 3, LIMA to LAD, SVG to RCA, SVG to OM3, EVH  . Sinusitis 2019  . Type II diabetes mellitus with complication (HCC)    CAD    PAST SURGICAL HISTORY:   Past Surgical History:  Procedure Laterality Date  . ABDOMINAL HYSTERECTOMY    . AMPUTATION TOE Left 02/06/2018   Procedure: AMPUTATION GREAT TOE;  Surgeon: FSamara Deist DPM;  Location: [facility] ORS;  Service: Podiatry;  Laterality: Left;  . ARTERIOVENOUS GRAFT PLACEMENT  05/2016  . AV FISTULA PLACEMENT Left 05/30/2016   Procedure: ARTERIOVENOUS graft;  Surgeon: JAlgernon Huxley MD;  Location: [facility] ORS;  Service: Vascular;  Laterality: Left;  . CAPD INSERTION N/A 10/30/2017   Procedure: LAPAROSCOPIC INSERTION CONTINUOUS AMBULATORY PERITONEAL DIALYSIS  (CAPD) CATHETER;  Surgeon: DAlgernon Huxley MD;  Location: [facility] ORS;  Service: Vascular;  Laterality: N/A;  . CARDIAC CATHETERIZATION  01/2015   [facility]: Ost LM 70%, p-m LAD 50-60% (Rest FFR + @ 0.75), mRCA 80-90%, ostD1  40%, pOM1 40%  . CATARACT EXTRACTION W/PHACO Left 01/18/2016   Procedure: CATARACT EXTRACTION PHACO AND INTRAOCULAR LENS PLACEMENT (IOC);  Surgeon: Eulogio Bear, MD;  Location: [facility] OR[facility];  Service: Ophthalmology;  Laterality: Left;  [place] 1.05 AP% 15.5 CDE 10.16 Fluid Pack Lot # [id number]  . CATARACT EXTRACTION W/PHACO Right 08/01/2016   Procedure: CATARACT EXTRACTION PHACO AND INTRAOCULAR LENS PLACEMENT (IOC);  Surgeon: Eulogio Bear, MD;  Location: [facility] ORS;  Service: Ophthalmology;  Laterality: Right;  [place] 01:00.6 AP% 11.4 CDE 6.93  LOT # [id number]  . COLONOSCOPY    . CORONARY ANGIOPLASTY     SENTS 02/12/16  . CORONARY ARTERY BYPASS  GRAFT  03/28/15    [facility]: Dr. Waldemar Dickens: LIMA to LAD, SVG to RCA, SVG to OM3, EVH  . DIALYSIS/PERMA CATHETER INSERTION N/A 02/11/2018   Procedure: DIALYSIS/PERMA CATHETER INSERTION;  Surgeon: Algernon Huxley, MD;  Location: [place] [facility];  Service: Cardiovascular;  Laterality: N/A;  . DIALYSIS/PERMA CATHETER REMOVAL Right 02/04/2018   Procedure: DIALYSIS/PERMA CATHETER REMOVAL;  Surgeon: Katha Cabal, MD;  Location: [place] [facility];  Service: Cardiovascular;  Laterality: Right;  . ESOPHAGOGASTRODUODENOSCOPY (EGD) WITH PROPOFOL N/A 10/24/2016   Procedure: ESOPHAGOGASTRODUODENOSCOPY (EGD) WITH PROPOFOL;  Surgeon: [name], MD;  Location: [facility];  Service: Endoscopy;  Laterality: N/A;  . EYE SURGERY Bilateral    cataract surgery  . EYE SURGERY     drains for glaucoma  . INSERTION EXPRESS TUBE SHUNT Right 08/01/2016   Procedure: INSERTION EXPRESS TUBE SHUNT;  Surgeon: Eulogio Bear, MD;  Location: [facility];  Service: Ophthalmology;  Laterality: Right;  . INSERTION OF AHMED VALVE Left 08/15/2016   Procedure: INSERTION OF AHMED VALVE;  Surgeon: Eulogio Bear, MD;  Location: [facility];  Service: Ophthalmology;  Laterality: Left;  . INSERTION OF DIALYSIS CATHETER    . LOWER EXTREMITY ANGIOGRAPHY Left 01/30/2018   Procedure: Lower Extremity Angiography;  Surgeon: Katha Cabal, MD;  Location: [place] [facility];  Service: Cardiovascular;  Laterality: Left;  . TEMPORARY DIALYSIS CATHETER N/A 02/06/2018   Procedure: TEMPORARY DIALYSIS CATHETER;  Surgeon: Katha Cabal, MD;  Location: [place] [facility];  Service: Cardiovascular;  Laterality: N/A;  . TRANSTHORACIC ECHOCARDIOGRAM  January 2017    EF 60-65%. GR 2 DD. Mild degenerative mitral valve disease but no prolapse or regurgitation. Mild left atrial dilation. Mild to moderate LVH. Pericardial effusion gone  . UPPER EXTREMITY ANGIOGRAPHY Left 06/27/2016   Procedure: Upper Extremity Angiography;  Surgeon: Algernon Huxley, MD;  Location: [place] [facility];  Service: Cardiovascular;  Laterality: Left;  . UPPER EXTREMITY ANGIOGRAPHY Left 08/22/2016   Procedure: Upper Extremity Angiography;  Surgeon: Algernon Huxley, MD;  Location: [place] [facility];  Service: Cardiovascular;  Laterality: Left;    SOCIAL HISTORY:   Social History   Tobacco Use  . Smoking status: Never Smoker  . Smokeless tobacco: Never Used  Substance Use Topics  . Alcohol use: No    FAMILY HISTORY:   Family History  Problem Relation Age of Onset  . Diabetes Mellitus II Mother   . Heart failure Mother   . Pancreatic cancer Father     DRUG ALLERGIES:   Allergies  Allergen Reactions  . Chlorthalidone Anaphylaxis, Itching and Rash  . Fentanyl Rash  . Midazolam Rash  . Ace Inhibitors Other (See Comments)    Reaction:  Hyperkalemia, agitation   . Angiotensin Receptor Blockers Other (See Comments)    Reaction:  Hyperkalemia, agitation   . Norvasc [Amlodipine] Itching and Rash  . Phenergan [Promethazine Hcl] Anxiety    "antsy, can't sit still"    REVIEW OF SYSTEMS:  CONSTITUTIONAL: No fever, fatigue or weakness.  EYES: Poor vision EARS, NOSE, AND THROAT: No tinnitus or ear pain. No sore throat.  Decreased hearing RESPIRATORY: No cough, shortness of breath, wheezing or hemoptysis.  CARDIOVASCULAR: No chest pain, orthopnea, edema.  GASTROINTESTINAL: No nausea, vomiting, diarrhea or abdominal pain. No blood in bowel movements GENITOURINARY: No dysuria, hematuria.  ENDOCRINE: No polyuria, nocturia,  HEMATOLOGY: No anemia, easy bruising or bleeding SKIN: No rash or lesion. MUSCULOSKELETAL: No joint pain or arthritis.   NEUROLOGIC: No tingling, numbness, weakness.  PSYCHIATRY: No anxiety or depression.   MEDICATIONS AT HOME:   Prior to Admission medications   Medication Sig Start Date End Date Taking? Authorizing Provider  apixaban (ELIQUIS) 2.5 MG TABS tablet Take 1 tablet (2.5 mg total) by mouth 2 (two) times daily.  02/15/18  Yes Vaughan Basta, MD  aspirin EC 81 MG tablet Take 1 tablet (81 mg total) by mouth daily. 10/19/16  Yes Dustin Flock, MD  atorvastatin (LIPITOR) 80 MG tablet Take 80 mg by mouth at bedtime.  05/05/17  Yes [provider]  calcitRIOL (ROCALTROL) 0.5 MCG capsule Take 0.5 mcg by mouth daily.   Yes [provider]  carvedilol (COREG) 3.125 MG tablet Take 3.125 mg by mouth 2 (two) times daily.    Yes [provider]  docusate sodium (COLACE) 100 MG capsule Take 1 capsule (100 mg total) by mouth 2 (two) times daily. 02/15/18  Yes Vaughan Basta, MD  folic acid (FOLVITE) 1 MG tablet Take 1 tablet (1 mg total) by mouth daily. 02/15/18 02/15/19 Yes Vaughan Basta, MD  insulin glargine (LANTUS) 100 unit/mL SOPN Inject 0.08 mLs (8 Units total) into the skin at bedtime. 02/15/18  Yes Vaughan Basta, MD  isosorbide mononitrate (IMDUR) 30 MG 24 hr tablet Take 30 mg by mouth daily.    Yes [provider]  midodrine (PROAMATINE) 10 MG tablet Take 1 tablet (10 mg total) by mouth 3 (three) times a week. Patient taking differently: Take 10 mg by mouth every Monday, Wednesday, and Friday.  02/17/18  Yes Vaughan Basta, MD  mirtazapine (REMERON) 7.5 MG tablet Take 7.5 mg by mouth at bedtime.   Yes [provider]  Multiple Vitamin (MULTIVITAMIN WITH MINERALS) TABS tablet Take 2 tablets by mouth daily. Gummy vitamins   Yes [provider]  Multiple Vitamins-Minerals (PRORENAL + D) TABS Take 1 tablet by mouth daily.   Yes [provider]  nitroGLYCERIN (NITROSTAT) 0.4 MG SL tablet Place 1 tablet (0.4 mg total) under the tongue every 5 (five) minutes as needed for chest pain. 09/05/16  Yes Elnore Cosens, MD  nizatidine (AXID) 150 MG capsule Take 1 capsule (150 mg total) by mouth 2 (two) times daily. 02/15/18  Yes Vaughan Basta, MD  Nutritional Supplements (FEEDING SUPPLEMENT, NEPRO CARB STEADY,)  LIQD Take 237 mLs by mouth 2 (two) times daily between meals. 02/15/18  Yes Vaughan Basta, MD  Omega-3 300 MG CAPS Take 2 capsules by mouth daily.   Yes [provider]  ondansetron (ZOFRAN) 4 MG tablet Take 4 mg by mouth every 8 (eight) hours as needed for nausea or vomiting.   Yes [provider]  oxyCODONE (OXY IR/ROXICODONE) 5 MG immediate release tablet Take 1 tablet (5 mg total) by mouth every 6 (six) hours as needed for moderate pain or  severe pain. 02/15/18  Yes Vaughan Basta, MD  sevelamer carbonate (RENVELA) 800 MG tablet Take 800 mg by mouth 3 (three) times daily with meals.   Yes [provider]  vitamin B-12 (CYANOCOBALAMIN) 1000 MCG tablet Take 1 tablet (1,000 mcg total) by mouth daily. 02/15/18  Yes Vaughan Basta, MD  ceFAZolin (ANCEF) IVPB Indication:  Osteomyelitis/Tricuspic Endocarditis Last Day of Therapy:  03/13/18 Labs - Once weekly:  CBC/D and CMP, 02/17/18 03/13/18  Vaughan Basta, MD  ceFAZolin (ANCEF) IVPB Inject 2 g into the vein every Tuesday with hemodialysis AND 2 g every Thursday with hemodialysis AND 3 g every Saturday with hemodialysis. Do all this for 24 days. Indication:  MSSA bacteremia Last Day of Therapy:  03/13/18 Labs - Once weekly:  CBC/D and BMP, Labs - Every other week:  ESR and CRP. 02/17/18 03/13/18  Vaughan Basta, MD      VITAL SIGNS:  Blood pressure (!) 148/57, pulse 75, temperature 98.8 F (37.1 C), temperature source Oral, resp. rate 18, height 5' 4"  (1.626 m), weight 61 kg, SpO2 97 %.  PHYSICAL EXAMINATION:  GENERAL:  67 y.o.-year-old patient lying in the bed with no acute distress.  EYES: Pupils equal, round, reactive to light and accommodation. No scleral icterus. Extraocular muscles intact.  HEENT: Head atraumatic, normocephalic. Oropharynx and nasopharynx clear.  NECK:  Supple, no jugular venous distention. No thyroid enlargement, no tenderness.  LUNGS: Normal breath  sounds bilaterally, no wheezing, rales,rhonchi or crepitation. No use of accessory muscles of respiration.  CARDIOVASCULAR: S1, S2 normal. No murmurs, rubs, or gallops.  ABDOMEN: Soft, nontender, nondistended. Bowel sounds present. No organomegaly or mass.  EXTREMITIES: No pedal edema, cyanosis, or clubbing.  NEUROLOGIC: Cranial nerves II through XII are intact. Muscle strength 5/5 in all extremities. Sensation intact. Gait not checked.  PSYCHIATRIC: The patient is alert and oriented x 3.  SKIN: Right foot with large area of demarcation underneath the foot and top of the foot.  All 5 toes have a very deep blackish area.  Unable to palpate pulse there.  Left foot looks more like chronic changes going up the shin and down into the foot.  Third toe looks gangrenous they are also.  LABORATORY PANEL:   CBC Recent Labs  Lab 02/18/18 0756  WBC 16.6*  HGB 8.0*  HCT 27.9*  PLT 403*   ------------------------------------------------------------------------------------------------------------------  Chemistries  Recent Labs  Lab 02/18/18 0756  NA 139  K 4.4  CL 97*  CO2 29  GLUCOSE 87  BUN 27*  CREATININE 5.23*  CALCIUM 8.7*  AST 22  ALT <5  ALKPHOS 154*  BILITOT 0.4   ------------------------------------------------------------------------------------------------------------------  Cardiac Enzymes Recent Labs  Lab 02/18/18 0756  TROPONINI 0.11*   ------------------------------------------------------------------------------------------------------------------  RADIOLOGY:  Dg Foot Complete Right  Result Date: 02/18/2018 CLINICAL DATA:  Bilateral pedal gangrene. History of left great toe amputation 4 days ago. Diabetes and end-stage renal disease. EXAM: RIGHT FOOT COMPLETE - 3+ VIEW COMPARISON:  None. FINDINGS: The bones appear demineralized. There is no evidence of acute fracture, dislocation or bone destruction. There is an old healed fracture of the distal 5th metatarsal  shaft. There is moderate joint space narrowing, osteophyte formation and mild hallux valgus deformity of the 1st MTP joint. There are lobulated calcifications within the 1st web space which could reflect hydroxyapatite deposition. Prominent vascular calcifications are noted. There is an additional lobular calcification in the plantar aspect of the hindfoot adjacent to the plantar artery which could reflect a small aneurysm. IMPRESSION: 1.  No acute osseous findings or radiographic evidence of osteomyelitis. 2. 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 Additional calcification in the 1st web space could be vascular or hydroxyapatite deposition. Electronically Signed   By: Richardean Sale M.D.   On: 02/18/2018 09:33    EKG:   Ordered by me  IMPRESSION AND PLAN:   1.  Limb threatening ischemia bilateral legs.  Right leg seems new as per the patient and left leg seems more chronic.  Gangrene on left third toe and looks like all of the toes on the right foot.  Large area of demarcation on the right foot on the bottom of the foot and top of the foot.  Heparin drip.  Case discussed with Dr. do for procedure for tomorrow.  N.p.o. after midnight.  Empiric antibiotics.  Case discussed with pharmacist to help decide on antibiotics since the patient was on Ancef as outpatient. 2.  End-stage renal disease on dialysis Tuesday Thursday and Saturday.  Case discussed with nephrology. 3.  Type 2 diabetes mellitus.  Put on sliding scale and give half dose of Lantus tonight 4.  History of CABG and CAD and peripheral vascular disease.  On aspirin and now heparin drip 5.  GERD on H2 blocker 6.  History of diastolic CHF but no signs currently.  Dialysis to manage fluid    All the records are reviewed and case discussed with ED provider. Management plans discussed with the patient, family and they are in agreement.  CODE STATUS: Full code  TOTAL TIME TAKING CARE OF THIS PATIENT: 50 minutes, including ACP time.     Loletha Grayer M.D on 02/18/2018 at 10:05 AM  Between 7am to 6pm - Pager - [phone]  After 6pm call admission pager [phone]  [facility] Office  [phone]  CC: Primary care physician; System, Pcp Not In

## 2018-02-18 NOTE — ED Notes (Signed)
Pt given warm blankets.

## 2018-02-18 NOTE — ED Notes (Signed)
Date and time results received: 02/18/18 1798   Test: troponins  Critical Value: 0.11  Name of Provider Notified: Dr. Quentin Cornwall

## 2018-02-18 NOTE — ED Notes (Signed)
Floor unable to receive report at this time.

## 2018-02-18 NOTE — Progress Notes (Addendum)
ANTICOAGULATION CONSULT NOTE - Initial Consult  Pharmacy Consult for Heparin Drip Indication: VTE treatment  Allergies  Allergen Reactions  . Chlorthalidone Anaphylaxis, Itching and Rash  . Fentanyl Rash  . Midazolam Rash  . Ace Inhibitors Other (See Comments)    Reaction:  Hyperkalemia, agitation   . Angiotensin Receptor Blockers Other (See Comments)    Reaction:  Hyperkalemia, agitation   . Norvasc [Amlodipine] Itching and Rash  . Phenergan [Promethazine Hcl] Anxiety    "antsy, can't sit still"    Patient Measurements: Height: 5' 4"  (162.6 cm) Weight: 134 lb 7.7 oz (61 kg) IBW/kg (Calculated) : 54.7 Heparin Dosing Weight: 61 kg  Vital Signs: Temp: 98.3 F (36.8 C) (10/30 1525) Temp Source: Oral (10/30 1525) BP: 138/73 (10/30 1525) Pulse Rate: 83 (10/30 1525)  Labs: Recent Labs    02/18/18 0756 02/18/18 1828  HGB 8.0*  --   HCT 27.9*  --   PLT 403*  --   APTT 45*  --   LABPROT 15.8*  --   INR 1.27  --   HEPARINUNFRC  --  0.71*  CREATININE 5.23*  --   TROPONINI 0.11*  --     Estimated Creatinine Clearance: 9.1 mL/min (A) (by C-G formula based on SCr of 5.23 mg/dL (H)).   Medical History: Past Medical History:  Diagnosis Date  . (HFpEF) heart failure with preserved ejection fraction ([place])    a. 01/2018 Echo: EF 50-55%, no rwma, Gr1 DD, triv AI, mild MR. Midly dil LA/RV, mod reduced RV fxn. Irreg thickening of TV w/ mobile echodensity that appears to arise from valve.  . Anemia   . Anorexia   . Bacteremia due to Pseudomonas   . Carotid arterial disease ([place])    a. 01/2017 Carotid U/S: RICA <63, LICA <01.  Marland Kitchen Complication of anesthesia    a. Pt reports h/o complication on 9 different occasions - ? hypotension/arrest.  . 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 01/2015 Cath [facility]): 70% LM, p-mLAD 50-60% (Resting FFR 0.75), mRCA 80-90%, ~40 Ost OM & D1-->CABG; b. 01/2016 Staged PCI of LCX x 2 and RCA.  Marland Kitchen ESRD (end stage renal  disease) on dialysis ([place])    a. ESRD secondary to acute kidney failure s/p CABG-->PD  . Essential hypertension   . GERD (gastroesophageal reflux disease)   . Heart murmur   . Hypercholesterolemia   . Myocardial infarction ([place]) 2016  . PVD (peripheral vascular disease) ([place])    a. 01/30/2018 PV Angio: Sev Left Popliteal dzs s/p PTA w/ 24m lutonix DEB w/ mech aspiration of the L popliteal, L AT, and Left tibioperoneal trunck and peroneal artery.  . 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  [facility]: Dr. BMarland KitchenHaithcock: CABG x 3, LIMA to LAD, SVG to RCA, SVG to OM3, EVH  . Sinusitis 2019  . Type II diabetes mellitus with complication (HCC)    CAD    Medications:  Scheduled:  . [START ON 02/19/2018] aspirin EC  81 mg Oral Daily  . atorvastatin  80 mg Oral QHS  . [START ON 02/19/2018] calcitRIOL  0.5 mcg Oral Daily  . carvedilol  3.125 mg Oral BID WC  . docusate sodium  100 mg Oral BID  . famotidine  10 mg Oral Daily  . feeding supplement (NEPRO CARB STEADY)  237 mL Oral BID BM  . folic acid  1 mg Oral Daily  . insulin aspart  0-5 Units Subcutaneous QHS  .  insulin aspart  0-9 Units Subcutaneous TID WC  . insulin glargine  4 Units Subcutaneous QHS  . [START ON 02/19/2018] isosorbide mononitrate  30 mg Oral Daily  . midodrine  10 mg Oral Q M,W,F  . mirtazapine  7.5 mg Oral QHS  . multivitamin with minerals  2 tablet Oral Daily  . omega-3 acid ethyl esters  1 capsule Oral Daily  . sevelamer carbonate  800 mg Oral TID WC  . vitamin B-12  1,000 mcg Oral Daily   Infusions:  . [START ON 02/19/2018] ceFEPime (MAXIPIME) IV    . heparin 1,050 Units/hr (02/18/18 1700)  . metronidazole 100 mL/hr at 02/18/18 1700  . [START ON 02/19/2018] vancomycin      Assessment: Pharmacy consulted to dose and monitor heparin drip for this 67 yo female presenting to ED with limb threatening ischemia bilateral legs. Troponin 0.11.  Vascular surgery procedure tomorrow.   Goal of Therapy:  Heparin level 0.3-0.7  units/ml Monitor platelets by anticoagulation protocol: Yes   Plan:  Heparin level supratherapeutic x 1. Baseline heparin level was not assessed even though patient takes Eliquis PTA. Will add-on aPTT to see if levels correlate. Spoke with RN Harrison who agrees to not make adjustment until she hears back from pharmacy.   02/18/18 20:14 aPTT therapeutic. Continue rate of 1050 units/hr. Will recheck aPTT in 6 hours and monitor HL daily until levels correlate.   Laural Benes, Pharm.D., BCPS Clinical Pharmacist 02/18/2018,7:35 PM

## 2018-02-18 NOTE — ED Notes (Signed)
Pharmacy called to send admission medications

## 2018-02-18 NOTE — ED Notes (Signed)
Heparin verified with Kirke Shaggy RN

## 2018-02-18 NOTE — Progress Notes (Signed)
Patient ID: Stephanie Ellis, female   DOB: Feb 19, 1951, 67 y.o.   MRN: [id number]  ACP note  Patient and granddaughter present.  Diagnosis: Limb threatening ischemia bilateral legs (right leg new as per patient), recent diagnosis of tricuspid endocarditis, end-stage renal disease, type 2 diabetes, CABG and history of CAD and peripheral vascular disease, GERD, history of diastolic CHF.  CODE STATUS discussed.  Patient wishes to be a full code.  Plan.  IV heparin drip.  Vascular surgery procedure tomorrow.  Patient is high risk for amputation of her lower extremities.  Continue to monitor closely.  Time spent on ACP discussion 17 minutes Dr. Loletha Grayer

## 2018-02-18 NOTE — ED Provider Notes (Signed)
[facility] Emergency Department Provider Note    First MD Initiated Contact with Patient 02/18/18 [phone]     (approximate)  I have reviewed the triage vital signs and the nursing notes.   HISTORY  Chief Complaint Wound Check    HPI Stephanie Ellis is a 67 y.o. female with very complex past medical history as listed below recent admission the hospital for gangrenous left foot with peripheral arterial disease status post amputation and vascular surgery intervention presents the ER for worsening discoloration of the right foot.  Patient was discharged back to facility reportedly on antibiotics with dialysis.  States that 3 days ago developed discoloration of the right foot.  Patient is uncertain as to whether she has been receiving antibiotics or not.  States that she has been compliant with her Eliquis.  Does not currently smoke.  Denies any pain at this time.  Patient was evaluated by MD at the [facility] where she is residing was directed to the ER due to concern for worsening gangrene.    Past Medical History:  Diagnosis Date  . (HFpEF) heart failure with preserved ejection fraction ([place])    a. 01/2018 Echo: EF 50-55%, no rwma, Gr1 DD, triv AI, mild MR. Midly dil LA/RV, mod reduced RV fxn. Irreg thickening of TV w/ mobile echodensity that appears to arise from valve.  . Anemia   . Anorexia   . Bacteremia due to Pseudomonas   . Carotid arterial disease ([place])    a. 01/2017 Carotid U/S: RICA <46, LICA <27.  Marland Kitchen Complication of anesthesia    a. Pt reports h/o complication on 9 different occasions - ? hypotension/arrest.  . 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 01/2015 Cath [facility]): 70% LM, p-mLAD 50-60% (Resting FFR 0.75), mRCA 80-90%, ~40 Ost OM & D1-->CABG; b. 01/2016 Staged PCI of LCX x 2 and RCA.  Marland Kitchen ESRD (end stage renal disease) on dialysis ([place])    a. ESRD secondary to acute kidney failure s/p CABG-->PD  . Essential  hypertension   . GERD (gastroesophageal reflux disease)   . Heart murmur   . Hypercholesterolemia   . Myocardial infarction ([place]) 2016  . PVD (peripheral vascular disease) ([place])    a. 01/30/2018 PV Angio: Sev Left Popliteal dzs s/p PTA w/ 50m lutonix DEB w/ mech aspiration of the L popliteal, L AT, and Left tibioperoneal trunck and peroneal artery.  . 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  [facility]: Dr. BMarland KitchenHaithcock: CABG x 3, LIMA to LAD, SVG to RCA, SVG to OM3, EVH  . Sinusitis 2019  . Type II diabetes mellitus with complication (HCC)    CAD   Family History  Problem Relation Age of Onset  . Diabetes Mellitus II Mother   . Heart failure Mother   . Pancreatic cancer Father    Past Surgical History:  Procedure Laterality Date  . ABDOMINAL HYSTERECTOMY    . AMPUTATION TOE Left 02/06/2018   Procedure: AMPUTATION GREAT TOE;  Surgeon: FSamara Deist DPM;  Location: [facility] ORS;  Service: Podiatry;  Laterality: Left;  . ARTERIOVENOUS GRAFT PLACEMENT  05/2016  . AV FISTULA PLACEMENT Left 05/30/2016   Procedure: ARTERIOVENOUS graft;  Surgeon: JAlgernon Huxley MD;  Location: [facility] ORS;  Service: Vascular;  Laterality: Left;  . CAPD INSERTION N/A 10/30/2017   Procedure: LAPAROSCOPIC INSERTION CONTINUOUS AMBULATORY PERITONEAL DIALYSIS  (CAPD) CATHETER;  Surgeon: DAlgernon Huxley MD;  Location: [facility] ORS;  Service: Vascular;  Laterality: N/A;  . CARDIAC CATHETERIZATION  01/2015   [facility]: Ost LM 70%, p-m LAD 50-60% (Rest FFR + @ 0.75), mRCA 80-90%, ostD1 40%, pOM1 40%  . CATARACT EXTRACTION W/PHACO Left 01/18/2016   Procedure: CATARACT EXTRACTION PHACO AND INTRAOCULAR LENS PLACEMENT (IOC);  Surgeon: Eulogio Bear, MD;  Location: [facility] ORS;  Service: Ophthalmology;  Laterality: Left;  [place] 1.05 AP% 15.5 CDE 10.16 Fluid Pack Lot # [id number]  . CATARACT EXTRACTION W/PHACO Right 08/01/2016   Procedure: CATARACT EXTRACTION PHACO AND INTRAOCULAR LENS PLACEMENT (IOC);  Surgeon: Eulogio Bear, MD;  Location: [facility] ORS;   Service: Ophthalmology;  Laterality: Right;  [place] 01:00.6 AP% 11.4 CDE 6.93  LOT # [id number]  . COLONOSCOPY    . CORONARY ANGIOPLASTY     SENTS 02/12/16  . CORONARY ARTERY BYPASS GRAFT  03/28/15    [facility]: Dr. Waldemar Dickens: LIMA to LAD, SVG to RCA, SVG to OM3, EVH  . DIALYSIS/PERMA CATHETER INSERTION N/A 02/11/2018   Procedure: DIALYSIS/PERMA CATHETER INSERTION;  Surgeon: Algernon Huxley, MD;  Location: [place] [facility];  Service: Cardiovascular;  Laterality: N/A;  . DIALYSIS/PERMA CATHETER REMOVAL Right 02/04/2018   Procedure: DIALYSIS/PERMA CATHETER REMOVAL;  Surgeon: Katha Cabal, MD;  Location: [place] [facility];  Service: Cardiovascular;  Laterality: Right;  . ESOPHAGOGASTRODUODENOSCOPY (EGD) WITH PROPOFOL N/A 10/24/2016   Procedure: ESOPHAGOGASTRODUODENOSCOPY (EGD) WITH PROPOFOL;  Surgeon: [name], MD;  Location: [facility] ENDOSCOPY;  Service: Endoscopy;  Laterality: N/A;  . EYE SURGERY Bilateral    cataract surgery  . EYE SURGERY     drains for glaucoma  . INSERTION EXPRESS TUBE SHUNT Right 08/01/2016   Procedure: INSERTION EXPRESS TUBE SHUNT;  Surgeon: Eulogio Bear, MD;  Location: [facility] ORS;  Service: Ophthalmology;  Laterality: Right;  . INSERTION OF AHMED VALVE Left 08/15/2016   Procedure: INSERTION OF AHMED VALVE;  Surgeon: Eulogio Bear, MD;  Location: [facility] ORS;  Service: Ophthalmology;  Laterality: Left;  . INSERTION OF DIALYSIS CATHETER    . LOWER EXTREMITY ANGIOGRAPHY Left 01/30/2018   Procedure: Lower Extremity Angiography;  Surgeon: Katha Cabal, MD;  Location: [place] [facility];  Service: Cardiovascular;  Laterality: Left;  . TEMPORARY DIALYSIS CATHETER N/A 02/06/2018   Procedure: TEMPORARY DIALYSIS CATHETER;  Surgeon: Katha Cabal, MD;  Location: [place] [facility];  Service: Cardiovascular;  Laterality: N/A;  . TRANSTHORACIC ECHOCARDIOGRAM  January 2017    EF 60-65%. GR 2 DD. Mild degenerative mitral valve disease but no prolapse or  regurgitation. Mild left atrial dilation. Mild to moderate LVH. Pericardial effusion gone  . UPPER EXTREMITY ANGIOGRAPHY Left 06/27/2016   Procedure: Upper Extremity Angiography;  Surgeon: Algernon Huxley, MD;  Location: [place] [facility];  Service: Cardiovascular;  Laterality: Left;  . UPPER EXTREMITY ANGIOGRAPHY Left 08/22/2016   Procedure: Upper Extremity Angiography;  Surgeon: Algernon Huxley, MD;  Location: [place] [facility];  Service: Cardiovascular;  Laterality: Left;   Patient Active Problem List   Diagnosis Date Noted  . Ischemia of extremity 02/18/2018  . ESRD (end stage renal disease) ([place])   . Diabetic osteomyelitis ([place])   . Peripheral vascular disease ([place])   . Advanced care planning/counseling discussion   . Palliative care by specialist   . Goals of care, counseling/discussion   . Diabetes mellitus, type 2 ([place]) 01/30/2018  . Sepsis ([place]) 01/29/2018  . Peripheral neuropathy 06/17/2017  . Encephalopathy 02/06/2017  . Hypertensive emergency 02/06/2017  . Endophthalmitis, left eye 12/05/2016  . Vomiting 10/24/2016  .  Hematuria 10/17/2016  . Acute lower UTI 10/17/2016  . Anesthesia complication 42/35/3614  . Chest pain with moderate risk for cardiac etiology 09/04/2016  . Steal syndrome dialysis vascular access, initial encounter ([place]) 06/18/2016  . 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 Coronary artery disease involving coronary bypass graft of native heart with angina pectoris ([place]) 02/10/2016  . Hypertension 02/02/2016  . Hyperlipidemia 02/02/2016  . Coronary artery disease involving left main coronary artery 10/04/2015  . Abdominal pain of unknown etiology 10/04/2015  . ESRD (end stage renal disease) on dialysis ([place])   . Type II diabetes mellitus with complication ([place])   . NSTEMI (non-ST elevated myocardial infarction) ([place])   . Right sided abdominal pain   . Elevated troponin 10/03/2015  . 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 Chronic diastolic  congestive heart failure ([place]) 04/19/2014  . Stage 5 chronic kidney disease on chronic dialysis ([place]) 04/19/2014  . Anemia 09/20/2013  . Routine health maintenance 01/29/2013      Prior to Admission medications   Medication Sig Start Date End Date Taking? Authorizing Provider  apixaban (ELIQUIS) 2.5 MG TABS tablet Take 1 tablet (2.5 mg total) by mouth 2 (two) times daily. 02/15/18  Yes Vaughan Basta, MD  aspirin EC 81 MG tablet Take 1 tablet (81 mg total) by mouth daily. 10/19/16  Yes Dustin Flock, MD  atorvastatin (LIPITOR) 80 MG tablet Take 80 mg by mouth at bedtime.  05/05/17  Yes [provider]  calcitRIOL (ROCALTROL) 0.5 MCG capsule Take 0.5 mcg by mouth daily.   Yes [provider]  carvedilol (COREG) 3.125 MG tablet Take 3.125 mg by mouth 2 (two) times daily.    Yes [provider]  docusate sodium (COLACE) 100 MG capsule Take 1 capsule (100 mg total) by mouth 2 (two) times daily. 02/15/18  Yes Vaughan Basta, MD  folic acid (FOLVITE) 1 MG tablet Take 1 tablet (1 mg total) by mouth daily. 02/15/18 02/15/19 Yes Vaughan Basta, MD  insulin glargine (LANTUS) 100 unit/mL SOPN Inject 0.08 mLs (8 Units total) into the skin at bedtime. 02/15/18  Yes Vaughan Basta, MD  isosorbide mononitrate (IMDUR) 30 MG 24 hr tablet Take 30 mg by mouth daily.    Yes [provider]  midodrine (PROAMATINE) 10 MG tablet Take 1 tablet (10 mg total) by mouth 3 (three) times a week. Patient taking differently: Take 10 mg by mouth every Monday, Wednesday, and Friday.  02/17/18  Yes Vaughan Basta, MD  mirtazapine (REMERON) 7.5 MG tablet Take 7.5 mg by mouth at bedtime.   Yes [provider]  Multiple Vitamin (MULTIVITAMIN WITH MINERALS) TABS tablet Take 2 tablets by mouth daily. Gummy vitamins   Yes [provider]  Multiple Vitamins-Minerals (PRORENAL + D) TABS Take 1 tablet by mouth daily.   Yes [provider]  nitroGLYCERIN (NITROSTAT) 0.4 MG SL tablet Place 1 tablet (0.4 mg total) under the tongue every 5 (five) minutes as needed for chest pain. 09/05/16  Yes Wieting, Richard, MD  nizatidine (AXID) 150 MG capsule Take 1 capsule (150 mg total) by mouth 2 (two) times daily. 02/15/18  Yes Vaughan Basta, MD  Nutritional Supplements (FEEDING SUPPLEMENT, NEPRO CARB STEADY,) LIQD Take 237 mLs by mouth 2 (two) times daily between meals. 02/15/18  Yes Vaughan Basta, MD  Omega-3 300 MG CAPS Take 2 capsules by mouth daily.   Yes [provider]  ondansetron (ZOFRAN) 4 MG tablet Take 4 mg by mouth every 8 (eight) hours as  needed for nausea or vomiting.   Yes [provider]  oxyCODONE (OXY IR/ROXICODONE) 5 MG immediate release tablet Take 1 tablet (5 mg total) by mouth every 6 (six) hours as needed for moderate pain or severe pain. 02/15/18  Yes Vaughan Basta, MD  sevelamer carbonate (RENVELA) 800 MG tablet Take 800 mg by mouth 3 (three) times daily with meals.   Yes [provider]  vitamin B-12 (CYANOCOBALAMIN) 1000 MCG tablet Take 1 tablet (1,000 mcg total) by mouth daily. 02/15/18  Yes Vaughan Basta, MD  ceFAZolin (ANCEF) IVPB Indication:  Osteomyelitis/Tricuspic Endocarditis Last Day of Therapy:  03/13/18 Labs - Once weekly:  CBC/D and CMP, 02/17/18 03/13/18  Vaughan Basta, MD  ceFAZolin (ANCEF) IVPB Inject 2 g into the vein every Tuesday with hemodialysis AND 2 g every Thursday with hemodialysis AND 3 g every Saturday with hemodialysis. Do all this for 24 days. Indication:  MSSA bacteremia Last Day of Therapy:  03/13/18 Labs - Once weekly:  CBC/D and BMP, Labs - Every other week:  ESR and CRP. 02/17/18 03/13/18  Vaughan Basta, MD    Allergies Chlorthalidone; Fentanyl; Midazolam; Ace inhibitors; Angiotensin receptor blockers; Norvasc [amlodipine]; and Phenergan [promethazine hcl]    Social History Social History    Tobacco Use  . Smoking status: Never Smoker  . Smokeless tobacco: Never Used  Substance Use Topics  . Alcohol use: No  . Drug use: No    Review of Systems Patient denies headaches, rhinorrhea, blurry vision, numbness, shortness of breath, chest pain, edema, cough, abdominal pain, nausea, vomiting, diarrhea, dysuria, fevers, rashes or hallucinations unless otherwise stated above in HPI. ____________________________________________   PHYSICAL EXAM:  VITAL SIGNS: Vitals:   02/18/18 1300 02/18/18 1310  BP: (!) 164/76 (!) 164/76  Pulse: (!) 45 79  Resp: 18   Temp:    SpO2: 100%     Constitutional: Alert, chronically ill appearing but in NAD Eyes: Conjunctivae are normal.  Head: Atraumatic. Nose: No congestion/rhinnorhea. Mouth/Throat: Mucous membranes are moist.   Neck: No stridor. Painless ROM.  Cardiovascular: Normal rate, regular rhythm. Grossly normal heart sounds.  Good peripheral circulation. Respiratory: Normal respiratory effort.  No retractions. Lungs CTAB. Gastrointestinal: Soft and nontender. No distention. No abdominal bruits. No CVA tenderness. Genitourinary: deferred Musculoskeletal: Right lower extremity with no palpable pedal or PT pulses there are monophasic week Doppler signals present to both.  Cannot palpate popliteal pulse.  Obvious gangrenous change to the midfoot going distally.  There is some surrounding erythema along the border of the gangrenous change.  No purulence noted.  Left lower extremity with strong PT and DP pulse and strong Doppler signals.  Area of previous great toe amputation is healing with some serous drainage.  There is some faint gangrenous change to the distal second toe. Neurologic:  Normal speech and language. No gross focal neurologic deficits are appreciated.  Skin:  No other abnormalities noted except for that listed above no rash noted. Psychiatric: Mood and affect are normal. Speech and behavior are  normal.  ____________________________________________   LABS (all labs ordered are listed, but only abnormal results are displayed)  Results for orders placed or performed during the hospital encounter of 02/18/18 (from the past 24 hour(s))  Lactic acid, plasma     Status: None   Collection Time: 02/18/18  7:54 AM  Result Value Ref Range   Lactic Acid, Venous 1.1 0.5 - 1.9 mmol/L  Comprehensive metabolic panel     Status: Abnormal   Collection Time: 02/18/18  7:56 AM  Result Value Ref Range   Sodium 139 135 - 145 mmol/L   Potassium 4.4 3.5 - 5.1 mmol/L   Chloride 97 (L) 98 - 111 mmol/L   CO2 29 22 - 32 mmol/L   Glucose, Bld 87 70 - 99 mg/dL   BUN 27 (H) 8 - 23 mg/dL   Creatinine, Ser 5.23 (H) 0.44 - 1.00 mg/dL   Calcium 8.7 (L) 8.9 - 10.3 mg/dL   Total Protein 7.4 6.5 - 8.1 g/dL   Albumin 2.0 (L) 3.5 - 5.0 g/dL   AST 22 15 - 41 U/L   ALT <5 0 - 44 U/L   Alkaline Phosphatase 154 (H) 38 - 126 U/L   Total Bilirubin 0.4 0.3 - 1.2 mg/dL   GFR calc non Af Amer 8 (L) >60 mL/min   GFR calc Af Amer 9 (L) >60 mL/min   Anion gap 13 5 - 15  Troponin I     Status: Abnormal   Collection Time: 02/18/18  7:56 AM  Result Value Ref Range   Troponin I 0.11 (HH) <0.03 ng/mL  CBC WITH DIFFERENTIAL     Status: Abnormal   Collection Time: 02/18/18  7:56 AM  Result Value Ref Range   WBC 16.6 (H) 4.0 - 10.5 K/uL   RBC 2.70 (L) 3.87 - 5.11 MIL/uL   Hemoglobin 8.0 (L) 12.0 - 15.0 g/dL   HCT 27.9 (L) 36.0 - 46.0 %   MCV 103.3 (H) 80.0 - 100.0 fL   MCH 29.6 26.0 - 34.0 pg   MCHC 28.7 (L) 30.0 - 36.0 g/dL   RDW 22.1 (H) 11.5 - 15.5 %   Platelets 403 (H) 150 - 400 K/uL   nRBC 0.0 0.0 - 0.2 %   Neutrophils Relative % 81 %   Neutro Abs 13.4 (H) 1.7 - 7.7 K/uL   Lymphocytes Relative 10 %   Lymphs Abs 1.6 0.7 - 4.0 K/uL   Monocytes Relative 6 %   Monocytes Absolute 1.0 0.1 - 1.0 K/uL   Eosinophils Relative 1 %   Eosinophils Absolute 0.2 0.0 - 0.5 K/uL   Basophils Relative 0 %   Basophils  Absolute 0.1 0.0 - 0.1 K/uL   WBC Morphology MORPHOLOGY UNREMARKABLE    RBC Morphology HIDE    Smear Review Normal platelet morphology    Immature Granulocytes 2 %   Abs Immature Granulocytes 0.30 (H) 0.00 - 0.07 K/uL  APTT     Status: Abnormal   Collection Time: 02/18/18  7:56 AM  Result Value Ref Range   aPTT 45 (H) 24 - 36 seconds  Protime-INR     Status: Abnormal   Collection Time: 02/18/18  7:56 AM  Result Value Ref Range   Prothrombin Time 15.8 (H) 11.4 - 15.2 seconds   INR 1.27    ____________________________________________  EKG My review and personal interpretation at Time: 7:41   Indication: limb ischemia  Rate: 85  Rhythm: sinus Axis: normal Other: inferolateral st abn, abn ekg ____________________________________________  RADIOLOGY  I personally reviewed all radiographic images ordered to evaluate for the above acute complaints and reviewed radiology reports and findings.  These findings were personally discussed with the patient.  Please see medical record for radiology report.  ____________________________________________   PROCEDURES  Procedure(s) performed:  .Critical Care Performed by: Merlyn Lot, MD Authorized by: Merlyn Lot, MD   Critical care provider statement:    Critical care time (minutes):  35   Critical care time was exclusive of:  Separately billable procedures and treating other patients   Critical care was necessary to treat or prevent imminent or life-threatening deterioration of the following conditions:  Circulatory failure   Critical care was time spent personally by me on the following activities:  Development of treatment plan with patient or surrogate, discussions with consultants, evaluation of patient's response to treatment, examination of patient, obtaining history from patient or surrogate, ordering and performing treatments and interventions, ordering and review of laboratory studies, ordering and review of radiographic  studies, pulse oximetry, re-evaluation of patient's condition and review of old charts      Critical Care performed: yes ____________________________________________   INITIAL IMPRESSION / ASSESSMENT AND PLAN / ED COURSE  Pertinent labs & imaging results that were available during my care of the patient were reviewed by me and considered in my medical decision making (see chart for details).   DDX: Limb ischemia, gangrene, osteo-, cellulitis, abscess, necrotizing fasciitis  Stephanie Ellis is a 70 y.o. who presents to the ED with symptoms as described above.  Patient with obvious gangrenous changes to right lower extremity.  Will be started immediately on broad-spectrum antibiotics as she does have a history of MRSA colonization.  We will give gentle IV hydration.  Check blood work as she is dialysis patient.  Will consult vascular surgery as well as podiatry.  Will initiate heparin per discussion with Dr. Lucky Cowboy as discussed with vascular surgery.  Clinical Course as of Feb 19 1420  Wed Feb 18, 2018  0757 Dr. Elvina Mattes of podiatry made aware the patient will be admitted to the hospital for further medical management.   [PR]  3149 No evidence of emergent hyperkalemia.  Troponin is elevated likely secondary to demand ischemia muscle tissue breakdown.  Patient receiving IV antibiotics.  Patient will require hospitalization for further medical management.   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    Merlyn Lot, MD 02/18/18 [phone]

## 2018-02-18 NOTE — ED Triage Notes (Signed)
PT [facility] and sent to [facility] via EMS for bilateral pedal gangrene. HX of L great toe amputation x4 days ago. Pt AO at this time. Pt temp 98.0 orally for EMs

## 2018-02-18 NOTE — Progress Notes (Signed)
Pharmacy Antibiotic Note  Stephanie Ellis is a 67 y.o. female admitted on 02/18/2018 with sepsis, cellulitis and MSSA with left great toe osteomyelitis, Tricuspid endocarditis. Patient has been receiving ancef outpatient. Total duration was planned for 6 weeks (stop date 11/22). New gangrene on presentation.  Pharmacy has been consulted for Vancomycin and cefepime dosing. Pt received vancomycin 1 g x 1 and cefepime 2 g x 1 in ED. Pt is also on Flagyl q8H. Pt on HD Tuesday/Thursday/Saturday   Plan: Will order vancomycin 500 mg x 1, for a total loading dose of 1500 mg. Will order vancomycin 500 mg with HD TTS due to higher loading dose and patient's weight is borderline. Will order vancomycin level prior to 3rd HD.   Target pre-HD level 15-25 mcg/mL  Will continue cefepime 1 g q24H.   Height: 5' 4"  (162.6 cm) Weight: 134 lb 7.7 oz (61 kg) IBW/kg (Calculated) : 54.7  Temp (24hrs), Avg:98.8 F (37.1 C), Min:98.8 F (37.1 C), Max:98.8 F (37.1 C)  Recent Labs  Lab 02/11/18 1321 02/12/18 0801 02/15/18 0409 02/18/18 0754 02/18/18 0756  WBC 20.6* 17.2* 19.1*  --  16.6*  CREATININE  --  6.19* 4.07*  --  5.23*  LATICACIDVEN  --   --   --  1.1  --     Estimated Creatinine Clearance: 9.1 mL/min (A) (by C-G formula based on SCr of 5.23 mg/dL (H)).    Allergies  Allergen Reactions  . Chlorthalidone Anaphylaxis, Itching and Rash  . Fentanyl Rash  . Midazolam Rash  . Ace Inhibitors Other (See Comments)    Reaction:  Hyperkalemia, agitation   . Angiotensin Receptor Blockers Other (See Comments)    Reaction:  Hyperkalemia, agitation   . Norvasc [Amlodipine] Itching and Rash  . Phenergan [Promethazine Hcl] Anxiety    "antsy, can't sit still"    Antimicrobials this admission: 10/30 cefepime >>  10/30  vancomycin >>  10/30 Flagyl >>   Dose adjustments this admission:   Microbiology results: 10/30 BCx: 2/2 pending  10/30 UCx: sent  10/10 MRSA PCR: negative 10/18 great toe cx:  MSSA  Thank you for allowing pharmacy to be a part of this patient's care.  [name] 02/18/2018 1:20 PM

## 2018-02-18 NOTE — ED Notes (Signed)
Pt on dialysis, only makes a small amount of urine and has already used the restroom this morning

## 2018-02-18 NOTE — Progress Notes (Signed)
[facility]  ROUNDING NOTE   Subjective:   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) [I70.263]  Patient's last hemodialysis was yesterday.   Daughters at bedside.   Was discharged from [facility] on 10/27  Angiogram scheduled for tomorrow  Objective:  Vital signs in last 24 hours:  Temp:  [98.3 F (36.8 C)-98.8 F (37.1 C)] 98.3 F (36.8 C) (10/30 1525) Pulse Rate:  [45-86] 83 (10/30 1525) Resp:  [13-21] 17 (10/30 1525) BP: (138-170)/(57-92) 138/73 (10/30 1525) SpO2:  [92 %-100 %] 98 % (10/30 1525) Weight:  [61 kg] 61 kg (10/30 0740)  Weight change:  Filed Weights   02/18/18 0740  Weight: 61 kg    Intake/Output: No intake/output data recorded.   Intake/Output this shift:  Total I/O In: 401.8 [IV Piggyback:401.8] Out: -   Physical Exam: General: NAD  Head: Normocephalic, atraumatic. Moist oral mucosal membranes  Eyes: Anicteric  Neck: Supple, trachea midline  Lungs:  Clear to auscultation  Heart: Regular rate and rhythm  Abdomen:  Soft, nontender  Extremities: Right lower extremity gangrene, left foot cold   Neurologic: Nonfocal, moving all four extremities  Skin: No lesions  Access: PD catheter, left IJ tunneled catheter    Basic Metabolic Panel: Recent Labs  Lab 02/12/18 0801 02/14/18 1421 02/15/18 0409 02/18/18 0756  NA 137  --   --  139  K 4.6  --   --  4.4  CL 92*  --   --  97*  CO2 29  --   --  29  GLUCOSE 155*  --   --  87  BUN 39*  --   --  27*  CREATININE 6.19*  --  4.07* 5.23*  CALCIUM 7.9*  --   --  8.7*  PHOS  --  3.6  --   --     Liver Function Tests: Recent Labs  Lab 02/18/18 0756  AST 22  ALT <5  ALKPHOS 154*  BILITOT 0.4  PROT 7.4  ALBUMIN 2.0*   No results for input(s): LIPASE, AMYLASE in the last 168 hours. No results for input(s): AMMONIA in the  last 168 hours.  CBC: Recent Labs  Lab 02/12/18 0801 02/15/18 0409 02/18/18 0756  WBC 17.2* 19.1* 16.6*  NEUTROABS  --   --  13.4*  HGB 7.6* 6.5* 8.0*  HCT 26.4* 23.7* 27.9*  MCV 104.3* 108.7* 103.3*  PLT 307 347 403*    Cardiac Enzymes: Recent Labs  Lab 02/18/18 0756  TROPONINI 0.11*    BNP: Invalid input(s): POCBNP  CBG: Recent Labs  Lab 02/14/18 1138 02/14/18 2135 02/15/18 0752 02/15/18 1156 02/15/18 1655  GLUCAP 277* 210* 125* 202* 234*    Microbiology: Results for orders placed or performed during the hospital encounter of 01/29/18  Blood Culture (routine x 2)     Status: Abnormal   Collection Time: 01/29/18  9:55 PM  Result Value Ref Range Status   Specimen Description   Final    BLOOD RIGHT ANTECUBITAL Performed at [facility], [address] [place], [place] [address]    Special Requests   Final    BOTTLES DRAWN AEROBIC AND ANAEROBIC Blood Culture adequate volume Performed at [facility], [address], [place], [place] [address]    Culture  Setup Time   Final    [place]  IN BOTH AEROBIC AND ANAEROBIC BOTTLES CRITICAL RESULT CALLED TO, READ BACK BY AND VERIFIED WITH: KAREN HAYES 01/30/18 0839 JGF Performed at [facility], [address] [place], [place] [address]    Culture (A)  Final    STAPHYLOCOCCUS AUREUS SUSCEPTIBILITIES PERFORMED ON PREVIOUS CULTURE WITHIN THE LAST 5 DAYS. Performed at [facility], [place] [address] [place], [place] [address]    Report Status 02/01/2018 FINAL  Final  Blood Culture (routine x 2)     Status: Abnormal   Collection Time: 01/29/18  9:55 PM  Result Value Ref Range Status   Specimen Description   Final    BLOOD RIGHT ANTECUBITAL Performed at [facility], [address] [place], [place] [address]    Special Requests   Final    BOTTLES DRAWN AEROBIC AND ANAEROBIC Blood Culture adequate volume Performed at [facility], [address] [place], [place] [address]    Culture  Setup Time   Final    GRAM POSITIVE COCCI IN CLUSTERS IN BOTH AEROBIC AND ANAEROBIC BOTTLES CRITICAL RESULT CALLED TO, READ BACK BY AND VERIFIED WITH: KAREN HAYES 01/30/18 0839 JGF Performed at [facility], [place] [address] [place], [place] [address]    Culture STAPHYLOCOCCUS AUREUS (A)  Final   Report Status 02/01/2018 FINAL  Final   Organism ID, Bacteria STAPHYLOCOCCUS AUREUS  Final      Susceptibility   Staphylococcus aureus - MIC*    CIPROFLOXACIN <=0.5 SENSITIVE Sensitive     ERYTHROMYCIN <=0.25 SENSITIVE Sensitive     GENTAMICIN <=0.5 SENSITIVE Sensitive     OXACILLIN 0.5 SENSITIVE Sensitive     TETRACYCLINE <=1 SENSITIVE Sensitive     VANCOMYCIN <=0.5 SENSITIVE Sensitive     TRIMETH/SULFA <=10 SENSITIVE Sensitive     CLINDAMYCIN <=0.25 SENSITIVE Sensitive     RIFAMPIN <=0.5 SENSITIVE Sensitive     Inducible Clindamycin NEGATIVE Sensitive     * STAPHYLOCOCCUS AUREUS  Blood Culture ID Panel (Reflexed)     Status: Abnormal   Collection Time: 01/29/18  9:55 PM  Result Value Ref Range Status   Enterococcus species NOT DETECTED NOT DETECTED Final   Vancomycin resistance NOT DETECTED NOT DETECTED Final   Listeria monocytogenes NOT DETECTED NOT DETECTED Final   Staphylococcus species DETECTED (A) NOT DETECTED Final    Comment: CRITICAL RESULT CALLED TO, READ BACK BY AND VERIFIED WITH: KAREN HAYES 01/30/18 0839 JGF    Staphylococcus aureus (BCID) DETECTED (A) NOT DETECTED Final    Comment: CRITICAL RESULT CALLED TO, READ BACK BY AND VERIFIED WITH: KAREN HAYES 01/30/18 0839 JGF Methicillin (oxacillin) susceptible Staphylococcus aureus (MSSA). Preferred therapy is anti staphylococcal beta lactam antibiotic (Cefazolin or Nafcillin), unless clinically contraindicated.    Methicillin resistance NOT DETECTED NOT DETECTED Final   Streptococcus species NOT DETECTED NOT DETECTED Final   Streptococcus agalactiae NOT DETECTED NOT  DETECTED Final   Streptococcus pneumoniae NOT DETECTED NOT DETECTED Final   Streptococcus pyogenes NOT DETECTED NOT DETECTED Final   Acinetobacter baumannii NOT DETECTED NOT DETECTED Final   Enterobacteriaceae species NOT DETECTED NOT DETECTED Final   Enterobacter cloacae complex NOT DETECTED NOT DETECTED Final   Escherichia coli NOT DETECTED NOT DETECTED Final   Klebsiella oxytoca NOT DETECTED NOT DETECTED Final   Klebsiella pneumoniae NOT DETECTED NOT DETECTED Final   Proteus species NOT DETECTED NOT DETECTED Final   Serratia marcescens NOT DETECTED NOT DETECTED Final   Carbapenem resistance NOT DETECTED NOT DETECTED Final   Haemophilus  influenzae NOT DETECTED NOT DETECTED Final   Neisseria meningitidis NOT DETECTED NOT DETECTED Final   Pseudomonas aeruginosa NOT DETECTED NOT DETECTED Final   Candida albicans NOT DETECTED NOT DETECTED Final   Candida glabrata NOT DETECTED NOT DETECTED Final   Candida krusei NOT DETECTED NOT DETECTED Final   Candida parapsilosis NOT DETECTED NOT DETECTED Final   Candida tropicalis NOT DETECTED NOT DETECTED Final    Comment: Performed at [facility], [address] [place], [place] [address]  MRSA PCR Screening     Status: None   Collection Time: 01/29/18 10:55 PM  Result Value Ref Range Status   MRSA by PCR NEGATIVE NEGATIVE Final    Comment:        The GeneXpert MRSA Assay (FDA approved for NASAL specimens only), is one component of a comprehensive MRSA colonization surveillance program. It is not intended to diagnose MRSA infection nor to guide or monitor treatment for MRSA infections. Performed at [facility], [address] [place], [place] [address]   Culture, blood (Routine X 2) w Reflex to ID Panel     Status: None   Collection Time: 01/30/18  9:27 AM  Result Value Ref Range Status   Specimen Description BLOOD RIGHT ANTECUBITAL  Final   Special Requests   Final    BOTTLES DRAWN AEROBIC AND ANAEROBIC Blood  Culture adequate volume   Culture   Final    NO GROWTH 5 DAYS Performed at [facility], [address] [place], [place] [address]    Report Status 02/04/2018 FINAL  Final  MRSA PCR Screening     Status: None   Collection Time: 02/05/18 11:23 PM  Result Value Ref Range Status   MRSA by PCR NEGATIVE NEGATIVE Final    Comment:        The GeneXpert MRSA Assay (FDA approved for NASAL specimens only), is one component of a comprehensive MRSA colonization surveillance program. It is not intended to diagnose MRSA infection nor to guide or monitor treatment for MRSA infections. Performed at [facility], [address], [place], [place] [address]   Aerobic/Anaerobic Culture (surgical/deep wound)     Status: None   Collection Time: 02/06/18  9:11 AM  Result Value Ref Range Status   Specimen Description TOE GREAT LEFT  Final   Special Requests   Final    NONE Performed at [facility], [address] [place], [place] [address]    Gram Stain NO WBC SEEN NO ORGANISMS SEEN   Final   Culture   Final    RARE STAPHYLOCOCCUS AUREUS NO ANAEROBES ISOLATED Performed at [facility], [place] [address], [place], [place] [address]    Report Status 02/12/2018 FINAL  Final   Organism ID, Bacteria STAPHYLOCOCCUS AUREUS  Final      Susceptibility   Staphylococcus aureus - MIC*    CIPROFLOXACIN <=0.5 SENSITIVE Sensitive     ERYTHROMYCIN <=0.25 SENSITIVE Sensitive     GENTAMICIN <=0.5 SENSITIVE Sensitive     OXACILLIN <=0.25 SENSITIVE Sensitive     TETRACYCLINE <=1 SENSITIVE Sensitive     VANCOMYCIN <=0.5 SENSITIVE Sensitive     TRIMETH/SULFA <=10 SENSITIVE Sensitive     CLINDAMYCIN <=0.25 SENSITIVE Sensitive     RIFAMPIN <=0.5 SENSITIVE Sensitive     Inducible Clindamycin NEGATIVE Sensitive     * RARE STAPHYLOCOCCUS AUREUS  Culture, blood (Routine X 2) w Reflex to ID Panel     Status: None   Collection Time:  02/13/18  1:09 AM  Result Value Ref Range  Status   Specimen Description BLOOD RIGHT ANTECUBITAL  Final   Special Requests   Final    BOTTLES DRAWN AEROBIC AND ANAEROBIC Blood Culture results may not be optimal due to an excessive volume of blood received in culture bottles   Culture   Final    NO GROWTH 5 DAYS Performed at [facility], [address] [place], [place] [address]    Report Status 02/18/2018 FINAL  Final  Culture, blood (Routine X 2) w Reflex to ID Panel     Status: None   Collection Time: 02/13/18  1:09 AM  Result Value Ref Range Status   Specimen Description BLOOD BLOOD LEFT HAND  Final   Special Requests   Final    BOTTLES DRAWN AEROBIC AND ANAEROBIC Blood Culture results may not be optimal due to an excessive volume of blood received in culture bottles   Culture   Final    NO GROWTH 5 DAYS Performed at [facility], [address] [place], [place] [address]    Report Status 02/18/2018 FINAL  Final    Coagulation Studies: Recent Labs    02/18/18 0756  LABPROT 15.8*  INR 1.27    Urinalysis: No results for input(s): COLORURINE, LABSPEC, PHURINE, GLUCOSEU, HGBUR, BILIRUBINUR, KETONESUR, PROTEINUR, UROBILINOGEN, NITRITE, LEUKOCYTESUR in the last 72 hours.  Invalid input(s): APPERANCEUR    Imaging: Dg Foot Complete Right  Result Date: 02/18/2018 CLINICAL DATA:  Bilateral pedal gangrene. History of left great toe amputation 4 days ago. Diabetes and end-stage renal disease. EXAM: RIGHT FOOT COMPLETE - 3+ VIEW COMPARISON:  None. FINDINGS: The bones appear demineralized. There is no evidence of acute fracture, dislocation or bone destruction. There is an old healed fracture of the distal 5th metatarsal shaft. There is moderate joint space narrowing, osteophyte formation and mild hallux valgus deformity of the 1st MTP joint. There are lobulated calcifications within the 1st web space which could reflect hydroxyapatite deposition. Prominent vascular calcifications are noted. There is an  additional lobular calcification in the plantar aspect of the hindfoot adjacent to the plantar artery which could reflect a small aneurysm. IMPRESSION: 1. No acute osseous findings or radiographic evidence of osteomyelitis. 2. 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 Additional calcification in the 1st web space could be vascular or hydroxyapatite deposition. Electronically Signed   By: Richardean Sale M.D.   On: 02/18/2018 09:33     Medications:   . [START ON 02/19/2018] ceFEPime (MAXIPIME) IV    . heparin 1,050 Units/hr (02/18/18 1013)  . metronidazole 500 mg (02/18/18 1619)  . [START ON 02/19/2018] vancomycin     . [START ON 02/19/2018] aspirin EC  81 mg Oral Daily  . atorvastatin  80 mg Oral QHS  . [START ON 02/19/2018] calcitRIOL  0.5 mcg Oral Daily  . carvedilol  3.125 mg Oral BID WC  . docusate sodium  100 mg Oral BID  . famotidine  10 mg Oral Daily  . feeding supplement (NEPRO CARB STEADY)  237 mL Oral BID BM  . folic acid  1 mg Oral Daily  . insulin aspart  0-5 Units Subcutaneous QHS  . insulin aspart  0-9 Units Subcutaneous TID WC  . insulin glargine  4 Units Subcutaneous QHS  . [START ON 02/19/2018] isosorbide mononitrate  30 mg Oral Daily  . midodrine  10 mg Oral Q M,W,F  . mirtazapine  7.5 mg Oral QHS  . multivitamin with minerals  2 tablet Oral Daily  . omega-3 acid ethyl esters  1 capsule Oral Daily  . sevelamer carbonate  800 mg Oral TID WC  . vitamin B-12  1,000 mcg Oral Daily   acetaminophen **OR** acetaminophen, nitroGLYCERIN, ondansetron **OR** ondansetron (ZOFRAN) IV, oxyCODONE  Assessment/ Plan:  Ms. Tayja Manzer is a 67 y.o. african [place] female with end stage renal disease on peritoneal dialysis, hypertension, congestive heart failure, coronary artery disease status post CABG, diabetes mellitus type II insulin dependent, GERD, peripheral vascular disease admitted to [facility] on 01/29/2018 for leg pain.    [facility] [place] TTS hemodialysis  RIJ permcath Transitioned from PD to HD this month  1. End Stage Renal Disease with hyperkalemia: transitioned from PD to hemodialysis.  -Patient completed hemodialysis yesterday.  No acute indication for dialysis today.  Maintain the patient on midodrine with dialysis treatments. - TTS schedule after angiogram tomorrow.   2.  Ischemic limb with osteomyelitis and peripheral vascular disease - heparin gtt.  - empiric metronidazole and cefepime.  - Appreciate vascular input. Angiogram for tomorrow.   3. Anemia of chronic kidney disease:  Hemoglobin 8  4. Hypotension:   -Maintain the patient on midodrine to help maintain blood pressure during dialysis treatment.  [place]onia Dana, MD [facility]  10/30/20195:37 PM

## 2018-02-18 NOTE — Progress Notes (Signed)
Stephanie Ellis is a 67 y.o. female with a history of end-stage renal disease, diabetes mellitus coronary artery disease status post CABG, peripheral neuropathy Left foot gangrene for which she was in [facility] between 01/29/18-02/15/18 and during that hospitalization had MSSA bacteremia, underwent removal of dialysis catheter, 2 d echo questioned tricuspid valve vegetation. She also had left great toe amputation ,and for the ischemic rt leg she had undergone percutaneous transluminal angioplasty of the left popliteal artery and mechanical aspiration of the left popliteal artery and left anterior tibial artery and left tibioperoneal trunk and peroneal artery emboli.  She was started on heparin. She was discharged to rehab facility on cefazolin to be given during days of dialysis for 6 weeks. She presented from the facility with purple discoloration of her rt leg She did not have any fever or chills No cough, sob or chest pain, palpitations, abdominal pain diarrhea, or muscle ache. She has numbness feet which is chronic and she did not experience any pain She has been started on vanco/cefepime and flagyl   Objective:  VITALS:  BP 138/73 (BP Location: Left Arm)   Pulse 83   Temp 98.3 F (36.8 C) (Oral)   Resp 17   Ht 5' 4"  (1.626 m)   Wt 61 kg   SpO2 98%   BMI 23.08 kg/m  PHYSICAL EXAM:  General: Alert, cooperative, no distress,   Head: Normocephalic, without obvious abnormality, atraumatic. Eyes: Conjunctivae clear, anicteric sclerae. Pupils are equal ENT Nares normal. No drainage or sinus tenderness. Lips, mucosa, and tongue normal. No Thrush Neck: Supple, symmetrical, no adenopathy, thyroid: non tender no carotid bruit and no JVD. Back: No CVA tenderness. Lungs: Clear to auscultation bilaterally. No Wheezing or Rhonchi. No rales. Heart: Regular rate and rhythm, no murmur, rub or gallop. Abdomen: Soft, non-tender,not distended. Bowel sounds normal. No masses Skin: No rashes or lesions. Or  bruising Lymph: Cervical, supraclavicular normal. Neurologic: Grossly non-focal, impaired sensation ( touch/pain) both feet  Gangrene of the  rt foot with bluish discoloration     Left foot gangrene is stable, left great toe has some soggines   Pertinent Labs Lab Results CBC    Component Value Date/Time   WBC 16.6 (H) 02/18/2018 0756   RBC 2.70 (L) 02/18/2018 0756   HGB 8.0 (L) 02/18/2018 0756   HCT 27.9 (L) 02/18/2018 0756   PLT 403 (H) 02/18/2018 0756   MCV 103.3 (H) 02/18/2018 0756   MCH 29.6 02/18/2018 0756   MCHC 28.7 (L) 02/18/2018 0756   RDW 22.1 (H) 02/18/2018 0756   LYMPHSABS 1.6 02/18/2018 0756   MONOABS 1.0 02/18/2018 0756   EOSABS 0.2 02/18/2018 0756   BASOSABS 0.1 02/18/2018 0756    CMP Latest Ref Rng & Units 02/18/2018 02/15/2018 02/12/2018  Glucose 70 - 99 mg/dL 87 - 155(H)  BUN 8 - 23 mg/dL 27(H) - 39(H)  Creatinine 0.44 - 1.00 mg/dL 5.23(H) 4.07(H) 6.19(H)  Sodium 135 - 145 mmol/L 139 - 137  Potassium 3.5 - 5.1 mmol/L 4.4 - 4.6  Chloride 98 - 111 mmol/L 97(L) - 92(L)  CO2 22 - 32 mmol/L 29 - 29  Calcium 8.9 - 10.3 mg/dL 8.7(L) - 7.9(L)  Total Protein 6.5 - 8.1 g/dL 7.4 - -  Total Bilirubin 0.3 - 1.2 mg/dL 0.4 - -  Alkaline Phos 38 - 126 U/L 154(H) - -  AST 15 - 41 U/L 22 - -  ALT 0 - 44 U/L <5 - -      Microbiology: Recent Results (  from the past 240 hour(s))  Culture, blood (Routine X 2) w Reflex to ID Panel     Status: None   Collection Time: 02/13/18  1:09 AM  Result Value Ref Range Status   Specimen Description BLOOD RIGHT ANTECUBITAL  Final   Special Requests   Final    BOTTLES DRAWN AEROBIC AND ANAEROBIC Blood Culture results may not be optimal due to an excessive volume of blood received in culture bottles   Culture   Final    NO GROWTH 5 DAYS Performed at [facility], [address], [place], [place] [address]    Report Status 02/18/2018 FINAL  Final  Culture, blood (Routine X 2) w Reflex to ID Panel     Status: None    Collection Time: 02/13/18  1:09 AM  Result Value Ref Range Status   Specimen Description BLOOD BLOOD LEFT HAND  Final   Special Requests   Final    BOTTLES DRAWN AEROBIC AND ANAEROBIC Blood Culture results may not be optimal due to an excessive volume of blood received in culture bottles   Culture   Final    NO GROWTH 5 DAYS Performed at [facility], [address], [place], [place] [address]    Report Status 02/18/2018 FINAL  Final   IMAGING RESULTS: ? Impression/Recommendation 67 y.o. female with a history of end-stage renal disease, diabetes mellitus coronary artery disease status post CABG, peripheral neuropathy was brought to the ED fromrehab facility with purplish discoloration of the rt foot She was recently in [facility] between 10/10-10 to 02/15/18 with worsening pain left leg. She had gangrene of the left great toe and foot and also had MSSA bacteremia, Tricuspid endocarditis ?  Rt foot gangrene: New  Seen by Vascular and will have angio tomorrow Now started on vanco+cefepime+flagyl ( continue all three for now)   ?Left great toe gangrene with the areas of ischemic islands on the foot. S/p amputation of great toe-some necrosis remains  Staph aureus bacteremia. Has tricuspid endocarditis The hemodialysis catheter has been removed . Will be getting   IV Cefazolin until 03/13/18 ( currently on vanco/cefepime instead)    Coronary artery disease status post CABG    Diabetes mellitus on insulin  ? ? ___________________________________________________ Discussed with patient and her  family,

## 2018-02-18 NOTE — Progress Notes (Signed)
ANTICOAGULATION CONSULT NOTE - Initial Consult  Pharmacy Consult for Heparin Drip Indication: VTE treatment  Allergies  Allergen Reactions  . Chlorthalidone Anaphylaxis, Itching and Rash  . Fentanyl Rash  . Midazolam Rash  . Ace Inhibitors Other (See Comments)    Reaction:  Hyperkalemia, agitation   . Angiotensin Receptor Blockers Other (See Comments)    Reaction:  Hyperkalemia, agitation   . Norvasc [Amlodipine] Itching and Rash  . Phenergan [Promethazine Hcl] Anxiety    "antsy, can't sit still"    Patient Measurements: Height: 5' 4"  (162.6 cm) Weight: 134 lb 7.7 oz (61 kg) IBW/kg (Calculated) : 54.7 Heparin Dosing Weight: 61 kg  Vital Signs: Temp: 98.8 F (37.1 C) (10/30 0739) Temp Source: Oral (10/30 0739) BP: 148/57 (10/30 0900) Pulse Rate: 75 (10/30 0900)  Labs: Recent Labs    02/18/18 0756  HGB 8.0*  HCT 27.9*  PLT 403*  APTT 45*  LABPROT 15.8*  INR 1.27  CREATININE 5.23*  TROPONINI 0.11*    Estimated Creatinine Clearance: 9.1 mL/min (A) (by C-G formula based on SCr of 5.23 mg/dL (H)).   Medical History: Past Medical History:  Diagnosis Date  . (HFpEF) heart failure with preserved ejection fraction ([place])    a. 01/2018 Echo: EF 50-55%, no rwma, Gr1 DD, triv AI, mild MR. Midly dil LA/RV, mod reduced RV fxn. Irreg thickening of TV w/ mobile echodensity that appears to arise from valve.  . Anemia   . Anorexia   . Bacteremia due to Pseudomonas   . Carotid arterial disease ([place])    a. 01/2017 Carotid U/S: RICA <08, LICA <65.  Marland Kitchen Complication of anesthesia    a. Pt reports h/o complication on 9 different occasions - ? hypotension/arrest.  . 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 01/2015 Cath [facility]): 70% LM, p-mLAD 50-60% (Resting FFR 0.75), mRCA 80-90%, ~40 Ost OM & D1-->CABG; b. 01/2016 Staged PCI of LCX x 2 and RCA.  Marland Kitchen ESRD (end stage renal disease) on dialysis ([place])    a. ESRD secondary to acute kidney failure s/p  CABG-->PD  . Essential hypertension   . GERD (gastroesophageal reflux disease)   . Heart murmur   . Hypercholesterolemia   . Myocardial infarction ([place]) 2016  . PVD (peripheral vascular disease) ([place])    a. 01/30/2018 [facility] Angio: Sev Left Popliteal dzs s/p PTA w/ 48m lutonix DEB w/ mech aspiration of the L popliteal, L AT, and Left tibioperoneal trunck and peroneal artery.  . 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  [facility]: Dr. BMarland KitchenHaithcock: CABG x 3, LIMA to LAD, SVG to RCA, SVG to OM3, EVH  . Sinusitis 2019  . Type II diabetes mellitus with complication (H[facility])    CAD    Medications:  Scheduled:  . [START ON 02/19/2018] aspirin EC  81 mg Oral Daily  . atorvastatin  80 mg Oral QHS  . [START ON 02/19/2018] calcitRIOL  0.5 mcg Oral Daily  . carvedilol  3.125 mg Oral BID WC  . docusate sodium  100 mg Oral BID  . famotidine  10 mg Oral Daily  . feeding supplement (NEPRO CARB STEADY)  237 mL Oral BID BM  . folic acid  1 mg Oral Daily  . insulin glargine  4 Units Subcutaneous QHS  . [START ON 02/19/2018] isosorbide mononitrate  30 mg Oral Daily  . midodrine  10 mg Oral Q M,W,F  . mirtazapine  7.5 mg Oral QHS  . multivitamin with minerals  2 tablet Oral Daily  . omega-3 acid ethyl esters  1 capsule Oral Daily  . sevelamer carbonate  800 mg Oral TID WC  . vitamin B-12  1,000 mcg Oral Daily   Infusions:  . heparin 1,050 Units/hr (02/18/18 1013)  . metronidazole Stopped (02/18/18 1004)    Assessment: Pharmacy consulted to dose and monitor heparin drip for this 67 yo female presenting to ED with limb threatening ischemia bilateral legs. Troponin 0.11.  Vascular surgery procedure tomorrow.   Goal of Therapy:  Heparin level 0.3-0.7 units/ml Monitor platelets by anticoagulation protocol: Yes   Plan:  Give 4000 units bolus x 1 Start heparin infusion at 1050 units/hr Check anti-Xa level in 8 hours and daily while on heparin Continue to monitor H&H and platelets   HL ordered for tonight at  18:00.   Mannie Wineland K, RPH 02/18/2018,10:34 AM

## 2018-02-19 ENCOUNTER — Encounter
Admission: EM | Disposition: A | Discharge status: Skilled Nursing Facility | Payer: Self-pay | Source: Home / Self Care | Attending: Internal Medicine

## 2018-02-19 ENCOUNTER — Inpatient Hospital Stay: Payer: Medicare Other | Admitting: Anesthesiology

## 2018-02-19 ENCOUNTER — Encounter: Payer: Self-pay | Admitting: Anesthesiology

## 2018-02-19 DIAGNOSIS — I70261 Atherosclerosis of native arteries of extremities with gangrene, right leg: Secondary | ICD-10-CM

## 2018-02-19 HISTORY — PX: LOWER EXTREMITY ANGIOGRAPHY: CATH118251

## 2018-02-19 LAB — CBC
HCT: 22.6 % — ABNORMAL LOW (ref 36.0–46.0)
Hemoglobin: 6.4 g/dL — ABNORMAL LOW (ref 12.0–15.0)
MCH: 29.8 pg (ref 26.0–34.0)
MCHC: 28.3 g/dL — ABNORMAL LOW (ref 30.0–36.0)
MCV: 105.1 fL — ABNORMAL HIGH (ref 80.0–100.0)
Platelets: 325 10*3/uL (ref 150–400)
RBC: 2.15 MIL/uL — ABNORMAL LOW (ref 3.87–5.11)
RDW: 21.6 % — ABNORMAL HIGH (ref 11.5–15.5)
WBC: 13.2 10*3/uL — ABNORMAL HIGH (ref 4.0–10.5)
nRBC: 0.2 % (ref 0.0–0.2)

## 2018-02-19 LAB — GLUCOSE, CAPILLARY
Glucose-Capillary: 130 mg/dL — ABNORMAL HIGH (ref 70–99)
Glucose-Capillary: 134 mg/dL — ABNORMAL HIGH (ref 70–99)
Glucose-Capillary: 76 mg/dL (ref 70–99)
Glucose-Capillary: 98 mg/dL (ref 70–99)

## 2018-02-19 LAB — HEPARIN LEVEL (UNFRACTIONATED): Heparin Unfractionated: 0.39 IU/mL (ref 0.30–0.70)

## 2018-02-19 LAB — APTT: aPTT: 136 seconds — ABNORMAL HIGH (ref 24–36)

## 2018-02-19 LAB — TROPONIN I: Troponin I: 0.07 ng/mL (ref <0.03)

## 2018-02-19 SURGERY — LOWER EXTREMITY ANGIOGRAPHY
Anesthesia: General | Laterality: Right

## 2018-02-19 MED ORDER — LIDOCAINE-EPINEPHRINE (PF) 1 %-1:200000 IJ SOLN
INTRAMUSCULAR | Status: AC
  Filled 2018-02-19: qty 30

## 2018-02-19 MED ORDER — METRONIDAZOLE IN NACL 5-0.79 MG/ML-% IV SOLN
500.0000 mg | Freq: Three times a day (TID) | INTRAVENOUS | Status: DC
  Administered 2018-02-20 – 2018-02-21 (×4): 500 mg via INTRAVENOUS
  Filled 2018-02-19 (×7): qty 100

## 2018-02-19 MED ORDER — ONDANSETRON HCL 4 MG/2ML IJ SOLN: 4.0000 mg | Freq: Once | INTRAMUSCULAR | Status: DC | PRN

## 2018-02-19 MED ORDER — HEPARIN (PORCINE) IN NACL 1000-0.9 UT/500ML-% IV SOLN
INTRAVENOUS | Status: AC
  Filled 2018-02-19: qty 1000

## 2018-02-19 MED ORDER — SODIUM CHLORIDE 0.9 % IV SOLN
INTRAVENOUS | Status: DC
  Administered 2018-02-19: 11:00:00 via INTRAVENOUS

## 2018-02-19 MED ORDER — LORAZEPAM 2 MG/ML IJ SOLN
0.5000 mg | INTRAMUSCULAR | Status: DC | PRN
  Filled 2018-02-19: qty 1

## 2018-02-19 MED ORDER — ALTEPLASE 2 MG IJ SOLR
INTRAMUSCULAR | Status: DC | PRN
  Administered 2018-02-19: 8 mg

## 2018-02-19 MED ORDER — PROPOFOL 10 MG/ML IV BOLUS
INTRAVENOUS | Status: AC
  Filled 2018-02-19: qty 20

## 2018-02-19 MED ORDER — METRONIDAZOLE IN NACL 5-0.79 MG/ML-% IV SOLN: 500.0000 mg | Freq: Three times a day (TID) | INTRAVENOUS | Status: DC

## 2018-02-19 MED ORDER — LIDOCAINE HCL (CARDIAC) PF 100 MG/5ML IV SOSY
PREFILLED_SYRINGE | INTRAVENOUS | Status: DC | PRN
  Administered 2018-02-19: 50 mg via INTRAVENOUS

## 2018-02-19 MED ORDER — HEPARIN SODIUM (PORCINE) 1000 UNIT/ML IJ SOLN
INTRAMUSCULAR | Status: DC | PRN
  Administered 2018-02-19: 4000 [IU] via INTRAVENOUS

## 2018-02-19 MED ORDER — IOPAMIDOL (ISOVUE-300) INJECTION 61%
INTRAVENOUS | Status: DC | PRN
  Administered 2018-02-19: 75 mL via INTRAVENOUS

## 2018-02-19 MED ORDER — PHENYLEPHRINE HCL 10 MG/ML IJ SOLN
INTRAMUSCULAR | Status: DC | PRN
  Administered 2018-02-19: 50 ug via INTRAVENOUS
  Administered 2018-02-19: 100 ug via INTRAVENOUS
  Administered 2018-02-19 (×2): 50 ug via INTRAVENOUS
  Administered 2018-02-19: 100 ug via INTRAVENOUS

## 2018-02-19 MED ORDER — PROPOFOL 10 MG/ML IV BOLUS
INTRAVENOUS | Status: DC | PRN
  Administered 2018-02-19: 70 mg via INTRAVENOUS
  Administered 2018-02-19: 20 mg via INTRAVENOUS

## 2018-02-19 SURGICAL SUPPLY — 23 items
BALLN LUTONIX 5X220X130 (BALLOONS) ×4
BALLN LUTONIX DCB 5X100X130 (BALLOONS) ×2
BALLN ULTRVRSE 3X150X130 (BALLOONS) ×2
BALLN ULTRVRSE 3X150X150 (BALLOONS) ×2
BALLOON LUTONIX 5X220X130 (BALLOONS) ×2 IMPLANT
BALLOON LUTONIX DCB 5X100X130 (BALLOONS) ×1 IMPLANT
BALLOON ULTRVRSE 3X150X130 (BALLOONS) ×1 IMPLANT
BALLOON ULTRVRSE 3X150X150 (BALLOONS) ×1 IMPLANT
CANISTER PENUMBRA ENGINE (MISCELLANEOUS) ×2 IMPLANT
CATH INDIGO CAT6 KIT (CATHETERS) ×2 IMPLANT
CATH PIG 70CM (CATHETERS) ×2 IMPLANT
DEVICE PRESTO INFLATION (MISCELLANEOUS) ×2 IMPLANT
DEVICE STARCLOSE SE CLOSURE (Vascular Products) ×2 IMPLANT
GLIDEWIRE ADV .035X260CM (WIRE) ×2 IMPLANT
PACK ANGIOGRAPHY (CUSTOM PROCEDURE TRAY) ×2 IMPLANT
PREP CHG 10.5 TEAL (MISCELLANEOUS) ×2 IMPLANT
SHEATH BRITE TIP 55CM 6FR (SHEATH) ×2 IMPLANT
SHEATH BRITE TIP 5FRX11 (SHEATH) ×2 IMPLANT
STENT VIABAHN 6X100X120 (Permanent Stent) ×2 IMPLANT
SYR MEDRAD MARK V 150ML (SYRINGE) ×2 IMPLANT
TUBING CONTRAST HIGH PRESS 72 (TUBING) ×2 IMPLANT
WIRE G V18X300CM (WIRE) ×2 IMPLANT
WIRE J 3MM .035X145CM (WIRE) ×2 IMPLANT

## 2018-02-19 NOTE — Progress Notes (Signed)
Patient refused lab to draw at bed side.  Patient reports all labs to be drawn during dialysis.  RN paged hospitalist to inform of refusal.

## 2018-02-19 NOTE — Progress Notes (Signed)
[facility]  ROUNDING NOTE   Subjective:   Bilateral angiogram and thrombectomy on right and left balloon angioplasty.  Family at bedside.  Hemodialysis for later today.   Objective:  Vital signs in last 24 hours:  Temp:  [97.4 F (36.3 C)-98.6 F (37 C)] 98.2 F (36.8 C) (10/31 1304) Pulse Rate:  [67-83] 69 (10/31 1304) Resp:  [2-20] 2 (10/31 1304) BP: (129-158)/(53-73) 150/59 (10/31 1304) SpO2:  [79 %-100 %] 97 % (10/31 1304)  Weight change:  Filed Weights   02/18/18 0740  Weight: 61 kg    Intake/Output: I/O last 3 completed shifts: In: 1022.9 [P.O.:30; I.V.:203.5; IV Piggyback:789.4] Out: -    Intake/Output this shift:  Total I/O In: 75 [I.V.:75] Out: 5 [Blood:5]  Physical Exam: General: NAD  Head: Normocephalic, atraumatic. Moist oral mucosal membranes  Eyes: Anicteric  Neck: Supple, trachea midline  Lungs:  Clear to auscultation  Heart: Regular rate and rhythm  Abdomen:  Soft, nontender  Extremities: Right lower extremity gangrene, left foot cold   Neurologic: Nonfocal, moving all four extremities  Skin: No lesions  Access: PD catheter, left IJ tunneled catheter    Basic Metabolic Panel: Recent Labs  Lab 02/14/18 1421 02/15/18 0409 02/18/18 0756  NA  --   --  139  K  --   --  4.4  CL  --   --  97*  CO2  --   --  29  GLUCOSE  --   --  87  BUN  --   --  27*  CREATININE  --  4.07* 5.23*  CALCIUM  --   --  8.7*  PHOS 3.6  --   --     Liver Function Tests: Recent Labs  Lab 02/18/18 0756  AST 22  ALT <5  ALKPHOS 154*  BILITOT 0.4  PROT 7.4  ALBUMIN 2.0*   No results for input(s): LIPASE, AMYLASE in the last 168 hours. No results for input(s): AMMONIA in the last 168 hours.  CBC: Recent Labs  Lab 02/15/18 0409 02/18/18 0756  WBC 19.1* 16.6*  NEUTROABS  --  13.4*  HGB 6.5* 8.0*  HCT 23.7* 27.9*  MCV 108.7* 103.3*  PLT 347 403*    Cardiac Enzymes: Recent Labs  Lab 02/18/18 0756  TROPONINI 0.11*     BNP: Invalid input(s): POCBNP  CBG: Recent Labs  Lab 02/15/18 1655 02/18/18 1712 02/18/18 2055 02/19/18 0748 02/19/18 1225  GLUCAP 234* 121* 153* 98 80    Microbiology: Results for orders placed or performed during the hospital encounter of 02/18/18  Blood Culture (routine x 2)     Status: None (Preliminary result)   Collection Time: 02/18/18  7:55 AM  Result Value Ref Range Status   Specimen Description BLOOD RIGHT AC  Final   Special Requests   Final    BOTTLES DRAWN AEROBIC AND ANAEROBIC Blood Culture results may not be optimal due to an excessive volume of blood received in culture bottles   Culture   Final    NO GROWTH 1 DAY Performed at [facility], [address] [place], [place] [address]    Report Status PENDING  Incomplete  Blood Culture (routine x 2)     Status: None (Preliminary result)   Collection Time: 02/18/18  7:55 AM  Result Value Ref Range Status   Specimen Description BLOOD RIGHT FA  Final   Special Requests   Final    BOTTLES DRAWN AEROBIC AND ANAEROBIC Blood Culture results may  not be optimal due to an excessive volume of blood received in culture bottles   Culture   Final    NO GROWTH 1 DAY Performed at [facility], [address] [place], [place] [address]    Report Status PENDING  Incomplete    Coagulation Studies: Recent Labs    02/18/18 0756  LABPROT 15.8*  INR 1.27    Urinalysis: No results for input(s): COLORURINE, LABSPEC, PHURINE, GLUCOSEU, HGBUR, BILIRUBINUR, KETONESUR, PROTEINUR, UROBILINOGEN, NITRITE, LEUKOCYTESUR in the last 72 hours.  Invalid input(s): APPERANCEUR    Imaging: Dg Foot Complete Right  Result Date: 02/18/2018 CLINICAL DATA:  Bilateral pedal gangrene. History of left great toe amputation 4 days ago. Diabetes and end-stage renal disease. EXAM: RIGHT FOOT COMPLETE - 3+ VIEW COMPARISON:  None. FINDINGS: The bones appear demineralized. There is no evidence of acute fracture,  dislocation or bone destruction. There is an old healed fracture of the distal 5th metatarsal shaft. There is moderate joint space narrowing, osteophyte formation and mild hallux valgus deformity of the 1st MTP joint. There are lobulated calcifications within the 1st web space which could reflect hydroxyapatite deposition. Prominent vascular calcifications are noted. There is an additional lobular calcification in the plantar aspect of the hindfoot adjacent to the plantar artery which could reflect a small aneurysm. IMPRESSION: 1. No acute osseous findings or radiographic evidence of osteomyelitis. 2. 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 Additional calcification in the 1st web space could be vascular or hydroxyapatite deposition. Electronically Signed   By: Richardean Sale M.D.   On: 02/18/2018 09:33     Medications:   . ceFEPime (MAXIPIME) IV    . heparin 1,050 Units/hr (02/19/18 0746)  . metronidazole 500 mg (02/18/18 2359)  . vancomycin     . aspirin EC  81 mg Oral Daily  . atorvastatin  80 mg Oral QHS  . calcitRIOL  0.5 mcg Oral Daily  . carvedilol  3.125 mg Oral BID WC  . Chlorhexidine Gluconate Cloth  6 each Topical Q0600  . docusate sodium  100 mg Oral BID  . famotidine  10 mg Oral Daily  . feeding supplement (NEPRO CARB STEADY)  237 mL Oral BID BM  . folic acid  1 mg Oral Daily  . insulin aspart  0-5 Units Subcutaneous QHS  . insulin aspart  0-9 Units Subcutaneous TID WC  . insulin glargine  4 Units Subcutaneous QHS  . isosorbide mononitrate  30 mg Oral Daily  . midodrine  10 mg Oral Q M,W,F  . mirtazapine  7.5 mg Oral QHS  . multivitamin with minerals  2 tablet Oral Daily  . omega-3 acid ethyl esters  1 capsule Oral Daily  . sevelamer carbonate  800 mg Oral TID WC  . vitamin B-12  1,000 mcg Oral Daily   acetaminophen **OR** acetaminophen, LORazepam, nitroGLYCERIN, ondansetron **OR** ondansetron (ZOFRAN) IV, oxyCODONE  Assessment/ Plan:  [name] is a 67  y.o. african [place] female with end stage renal disease on peritoneal dialysis, hypertension, congestive heart failure, coronary artery disease status post CABG, diabetes mellitus type II insulin dependent, GERD, peripheral vascular disease admitted to [facility] on 01/29/2018 for leg pain.    [facility] [place] TTS hemodialysis RIJ permcath Transitioned from PD to HD this month  1. End Stage Renal Disease with hyperkalemia: transitioned from PD to hemodialysis.  - Hemodialysis for later today  2.  Ischemic limb with osteomyelitis and peripheral vascular disease - heparin gtt.  - empiric metronidazole  and cefepime.  - Appreciate vascular input. Angiogram with angioplasty   3. Anemia of chronic kidney disease:  Hemoglobin 8  4. Hypotension:   -Maintain the patient on midodrine to help maintain blood pressure during dialysis treatment.  Lavonia Dana, MD [facility]  10/31/20191:44 PM

## 2018-02-19 NOTE — Progress Notes (Signed)
Post HD assessment. Pt tolerated tx well without c/o or complication. Net UF 1557, goal met.    02/19/18 2312  Vital Signs  Temp 98.9 F (37.2 C)  Temp Source Oral  Pulse Rate 75  Pulse Rate Source Monitor  Resp (!) 21  BP (!) 148/56  BP Location Left Arm  BP Method Automatic  Patient Position (if appropriate) Lying  Oxygen Therapy  SpO2 99 %  O2 Device Room Air  Dialysis Weight  Weight 68.8 kg  Type of Weight Post-Dialysis  Post-Hemodialysis Assessment  Rinseback Volume (mL) 250 mL  KECN 82.1 V  Dialyzer Clearance Lightly streaked  Duration of HD Treatment -hour(s) 3 hour(s)  Hemodialysis Intake (mL) 500 mL  UF Total -Machine (mL) 2057 mL  Net UF (mL) 1557 mL  Tolerated HD Treatment Yes  Hemodialysis Catheter Left Subclavian Double-lumen  Placement Date: 02/11/18   Placed prior to admission: No  Orientation: Left  Access Location: Subclavian  Hemodialysis Catheter Type: Double-lumen  Site Condition No complications  Blue Lumen Status Heparin locked  Red Lumen Status Heparin locked  Purple Lumen Status N/A  Catheter fill solution Heparin 1000 units/ml  Catheter fill volume (Arterial) 1.7 cc  Catheter fill volume (Venous) 1.7  Dressing Type Biopatch  Dressing Status Clean;Dry;Intact;Dressing changed  Interventions New dressing  Drainage Description None  Dressing Change Due 02/26/18  Post treatment catheter status Capped and Clamped

## 2018-02-19 NOTE — Op Note (Signed)
[name]  Percutaneous Study/Intervention Procedural Note   Date of Surgery: 02/19/2018  Surgeon(s):Lula Michaux    Assistants:none  Pre-operative Diagnosis: PAD with gangrene BLE  Post-operative diagnosis:  Same  Procedure(s) Performed:             1.  Ultrasound guidance for vascular access left femoral artery             2.  Catheter placement into right SFA from left femoral approach             3.  Aortogram and selective right lower extremity angiogram             4.   Catheter directed thrombolytic therapy to right popliteal artery with 8 mg of TPA             5.   Mechanical thrombectomy with the penumbra cat 6 device to the right popliteal artery, tibioperoneal trunk, posterior tibial artery, and peroneal artery  6.  Percutaneous transluminal angioplasty of the right posterior tibial artery and tibioperoneal trunk with 3 mm diameter by 15 cm length angioplasty balloon  7.  Percutaneous transluminal angioplasty of the right popliteal artery and distal SFA with 5 mm diameter by 22 cm length Lutonix drug-coated angioplasty balloon  8.  Percutaneous transluminal angioplasty of the right mid and distal SFA for native disease with 5 mm diameter by 22 cm length Lutonix drug-coated angioplasty balloon  9.  Viabahn stent placement to the right popliteal artery with 6 mm diameter by 10 cm length Viabahn stent for greater than 50% residual stenosis and thrombus after angioplasty  10.  Percutaneous transluminal angioplasty of the right peroneal artery and tibioperoneal trunk with 3 mm diameter by 15 cm length angioplasty balloon             11.  StarClose closure device left femoral artery  EBL: 150 cc  Contrast: 75 cc  Fluoro Time: 9 minutes  Anesthesia: General              Indications:  Patient is a 67 y.o.female with gangrenous changes to both feet.  She is Artie been treated for left lower extremity arterial insufficiency due to septic emboli to her left  leg.  She is now here to address her right leg perfusion. The patient is brought in for angiography for further evaluation and potential treatment.  Due to the limb threatening nature of the situation, angiogram was performed for attempted limb salvage. The patient is aware that if the procedure fails, amputation would be expected.  The patient also understands that even with successful revascularization, amputation may still be required due to the severity of the situation.  Risks and benefits are discussed and informed consent is obtained.   Procedure:  The patient was identified and appropriate procedural time out was performed.  The patient was then placed supine on the table and prepped and draped in the usual sterile fashion. Moderate conscious sedation was administered during a face to face encounter with the patient throughout the procedure with my supervision of the RN administering medicines and monitoring the patient's vital signs, pulse oximetry, telemetry and mental status throughout from the start of the procedure until the patient was taken to the recovery room. Ultrasound was used to evaluate the left common femoral artery.  It was patent .  A digital ultrasound image was acquired.  A Seldinger needle was used to access the left common femoral artery under direct ultrasound guidance and a permanent  image was performed.  A 0.035 J wire was advanced without resistance and a 5Fr sheath was placed.  Pigtail catheter was placed into the aorta and an AP aortogram was performed. This demonstrated normal renal arteries although the flow was somewhat sluggish to the renal arteries and normal aorta and iliac segments without significant stenosis. I then crossed the aortic bifurcation and advanced to the right femoral head and the pigtail catheter was then advanced down into the proximal right SFA for evaluation of the right lower extremity. Selective right lower extremity angiogram was then performed. This  demonstrated calcific but not stenotic common femoral artery and profunda femoris artery.  The SFA had 2 areas of 70 to 80% focal stenosis one in the proximal segment and one in the mid segment.  This was from atherosclerotic disease.  There was an abrupt occlusion of the popliteal artery continuing over several centimeters consistent with an embolic phenomenon and thrombosis.  The tibial vessels were reconstituted and appeared diseased in all 3 tibial vessels.  Initial opacification was reasonably poor. It was felt that it was in the patient's best interest to proceed with intervention after these images to avoid a second procedure and a larger amount of contrast and fluoroscopy based off of the findings from the initial angiogram. The patient was systemically heparinized and a 6 French 55 cm sheath was then placed over the Terumo Advantage wire. I then used a Kumpe catheter and the advantage wire to navigate through the native SFA disease as well as the occluded popliteal artery.  I exchanged for the penumbra cat 6 catheter and instilled 8 mg of TPA in the right popliteal artery in the area of the embolus and thrombosis.  After this dwelled for several minutes, the penumbra cat 6 device was brought onto the field and mechanical thrombectomy was performed.  This was taken down throughout the popliteal artery with multiple passes made.  Imaging showed essentially no runoff in the tibial vessels and so I continued the penumbra catheter down into the tibioperoneal trunk and the proximal portion of the posterior tibial artery.  This resulted in a channel but significant residual thrombus narrowing in the popliteal artery as well as near occlusion of the tibioperoneal trunk and both peroneal artery and posterior tibial arteries.  The anterior tibial artery remained occluded.  She also had the native disease in the SFA that needed treatment, so our next step was angioplasty.  The posterior tibial artery in the proximal  segment and the tibioperoneal trunk were treated with a 3 mm diameter by 15 cm length angioplasty balloon inflated to 10 atm for 1 minute.  I then treated the popliteal artery and distal SFA with a 5 mm diameter by 22 cm length Lutonix drug-coated angioplasty balloon inflated to 12 atm for 1 minute.  The proximal and mid SFA atherosclerotic lesions were treated with a second 5 mm diameter by 22 cm length Lutonix drug-coated angioplasty balloon inflated to 14 atm for 1 minute.  The proximal and mid SFA lesions were improved with 30 to 35% residual stenosis proximally and about 30% residual stenosis in the mid segment.  There remained high-grade residual stenosis in the popliteal artery at the knee and continued thrombus down in the tibioperoneal trunk and both the peroneal and posterior tibial arteries.  Another pass with the penumbra cat 6 device was used and I redirected the wire down the peroneal artery and perform thrombectomy of the proximal peroneal artery and tibioperoneal trunk.  With minimal  improvement, a 3 mm diameter by 15 cm length angioplasty balloon were used to treat the tibioperoneal trunk and proximal peroneal artery and were inflated to 10 atm for 1 minute.  The residual lesion in the popliteal artery was treated with a 6 mm diameter by 10 cm length Viabahn covered stent postdilated with a 5 mm diameter Lutonix drug-coated angioplasty balloon.  The popliteal artery now demonstrated less than 15% residual stenosis.  There remained some thrombus in the tibioperoneal trunk and occluding the proximal portion of the peroneal artery and posterior tibial arteries.  A final pass with the penumbra cat 6 device concentrating on the tibioperoneal trunk but extending down into the proximal peroneal artery was then performed.  A blob of thrombus was removed, and completion imaging now showed two-vessel runoff distally through both the peroneal artery and posterior tibial arteries.  The degree of residual  narrowing appeared to be less than 30%.  At this point I felt we had done all we could do from an perfusion standpoint and that her blood flow was reasonably good.  She and her family understand her gangrenous changes may still require amputation, but today's revascularization appeared to be successful. I elected to terminate the procedure. The sheath was removed and StarClose closure device was deployed in the left femoral artery with excellent hemostatic result. The patient was taken to the recovery room in stable condition having tolerated the procedure well.  Findings:               Aortogram:  This demonstrated normal renal arteries although the flow was somewhat sluggish to the renal arteries and normal aorta and iliac segments without significant stenosis             Right lower Extremity:  Calcific but not stenotic common femoral artery and profunda femoris artery.  The SFA had 2 areas of 70 to 80% focal stenosis one in the proximal segment and one in the mid segment.  This was from atherosclerotic disease.  There was an abrupt occlusion of the popliteal artery continuing over several centimeters consistent with an embolic phenomenon and thrombosis.  The tibial vessels were reconstituted and appeared diseased in all 3 tibial vessels.  Initial opacification was reasonably poor.   Disposition: Patient was taken to the recovery room in stable condition having tolerated the procedure well.  Complications: None  Leotis Pain 02/19/2018 12:13 PM   This note was created with Dragon Medical transcription system. Any errors in dictation are purely unintentional.

## 2018-02-19 NOTE — Anesthesia Procedure Notes (Signed)
Procedure Name: LMA Insertion Performed by: Lance Muss, CRNA Pre-anesthesia Checklist: Patient identified, Patient being monitored, Timeout performed, Emergency Drugs available and Suction available Patient Re-evaluated:Patient Re-evaluated prior to induction Oxygen Delivery Method: Circle system utilized Preoxygenation: Pre-oxygenation with 100% oxygen Induction Type: IV induction Ventilation: Mask ventilation without difficulty LMA: LMA inserted LMA Size: 4.0 Tube type: Oral Number of attempts: 1 Placement Confirmation: positive ETCO2 and breath sounds checked- equal and bilateral Tube secured with: Tape Dental Injury: Teeth and Oropharynx as per pre-operative assessment

## 2018-02-19 NOTE — Progress Notes (Signed)
Post HD assessment    02/19/18 2311  Neurological  Level of Consciousness Alert  Orientation Level Oriented X4  Respiratory  Respiratory Pattern Regular;Unlabored  Chest Assessment Chest expansion symmetrical  Cardiac  ECG Monitor Yes  Cardiac Rhythm NSR  Vascular  R Radial Pulse +2  L Radial Pulse +2  Integumentary  Integumentary (WDL) X  Skin Color Appropriate for ethnicity  Musculoskeletal  Musculoskeletal (WDL) X  Generalized Weakness Yes  Assistive Device None  GU Assessment  Genitourinary (WDL) X  Genitourinary Symptoms  (HD)  Psychosocial  Psychosocial (WDL) WDL

## 2018-02-19 NOTE — Progress Notes (Signed)
ANTICOAGULATION CONSULT NOTE - Initial Consult  Pharmacy Consult for Heparin Drip Indication: VTE treatment  Patient Measurements: Height: 5' 4"  (162.6 cm) Weight: 134 lb 7.7 oz (61 kg) IBW/kg (Calculated) : 54.7  Vital Signs: Temp: 97.4 F (36.3 C) (10/31 0501) Temp Source: Oral (10/31 0501) BP: 148/62 (10/31 0744) Pulse Rate: 75 (10/31 0744)  Labs: Recent Labs    02/18/18 0756 02/18/18 1828  HGB 8.0*  --   HCT 27.9*  --   PLT 403*  --   APTT 45* 91*  LABPROT 15.8*  --   INR 1.27  --   HEPARINUNFRC  --  0.71*  CREATININE 5.23*  --   TROPONINI 0.11*  --     Medical History: Past Medical History:  Diagnosis Date  . (HFpEF) heart failure with preserved ejection fraction ([place])    a. 01/2018 Echo: EF 50-55%, no rwma, Gr1 DD, triv AI, mild MR. Midly dil LA/RV, mod reduced RV fxn. Irreg thickening of TV w/ mobile echodensity that appears to arise from valve.  . Anemia   . Anorexia   . Bacteremia due to Pseudomonas   . Carotid arterial disease ([place])    a. 01/2017 Carotid U/S: RICA <42, LICA <87.  Marland Kitchen Complication of anesthesia    a. Pt reports h/o complication on 9 different occasions - ? hypotension/arrest.  . 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 01/2015 [facility]): 70% LM, p-mLAD 50-60% (Resting FFR 0.75), mRCA 80-90%, ~40 Ost OM & D1-->CABG; b. 01/2016 Staged PCI of LCX x 2 and RCA.  Marland Kitchen ESRD (end stage renal disease) on dialysis ([place])    a. ESRD secondary to acute kidney failure s/p CABG-->PD  . Essential hypertension   . GERD (gastroesophageal reflux disease)   . Heart murmur   . Hypercholesterolemia   . Myocardial infarction ([place]) 2016  . PVD (peripheral vascular disease) ([place])    a. 01/30/2018 [facility] Angio: Sev Left Popliteal dzs s/p PTA w/ 36m lutonix DEB w/ mech aspiration of the L popliteal, L AT, and Left tibioperoneal trunck and peroneal artery.  . 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  [facility]: Dr. BMarland KitchenHaithcock: CABG x 3, LIMA to  LAD, SVG to RCA, SVG to OM3, EVH  . Sinusitis 2019  . Type II diabetes mellitus with complication (H[facility])    CAD    Medications:  Scheduled:  . aspirin EC  81 mg Oral Daily  . atorvastatin  80 mg Oral QHS  . calcitRIOL  0.5 mcg Oral Daily  . carvedilol  3.125 mg Oral BID WC  . Chlorhexidine Gluconate Cloth  6 each Topical Q0600  . docusate sodium  100 mg Oral BID  . famotidine  10 mg Oral Daily  . feeding supplement (NEPRO CARB STEADY)  237 mL Oral BID BM  . folic acid  1 mg Oral Daily  . insulin aspart  0-5 Units Subcutaneous QHS  . insulin aspart  0-9 Units Subcutaneous TID WC  . insulin glargine  4 Units Subcutaneous QHS  . isosorbide mononitrate  30 mg Oral Daily  . midodrine  10 mg Oral Q M,W,F  . mirtazapine  7.5 mg Oral QHS  . multivitamin with minerals  2 tablet Oral Daily  . omega-3 acid ethyl esters  1 capsule Oral Daily  . sevelamer carbonate  800 mg Oral TID WC  . vitamin B-12  1,000 mcg Oral Daily   Infusions:  . sodium chloride    .  ceFAZolin (ANCEF) IV    . ceFEPime (MAXIPIME) IV    . heparin 1,050 Units/hr (02/19/18 0746)  . metronidazole 500 mg (02/18/18 2359)  . vancomycin      Assessment: Pharmacy consulted to dose and monitor heparin drip for this 67 yo female presenting to ED with limb threatening ischemia bilateral legs. Baseline troponin 0.11.  Vascular surgery procedure 10/31. She was on apixaban PTA. Baseline aPTT 45s, no baseline heparin level drawn.  She refuses all lab draws except for those done during HD sessions. This morning she was taken by vascular for an angioplasty and the infusion ran during the entire procedure. She is scheduled for HD this evening, therefore, aPTT and HL will be ordered with this session's labs.  Heparin course:  Initiation: 4000 unit bolus, then 1050 units/hr 10/30 1828 aPTT 91s, HL 0.71   Goal of Therapy:  Heparin level 0.3-0.7 units/ml Monitor platelets by anticoagulation protocol: Yes   Plan:  Continue rate  of 1050 units/hr. Will recheck aPTT and HL with HD session. Monitor HL daily until levels correlate.   Dallie Piles, Pharm.D. Clinical Pharmacist 02/19/2018,7:53 AM

## 2018-02-19 NOTE — Progress Notes (Addendum)
[facility] [place] NAME: Rissie Sculley    MR#:  [id number]  DATE OF BIRTH:  [birth year]  SUBJECTIVE:  States she is doing fine this morning.  Angiogram went well.  She denies any pain in her feet.  REVIEW OF SYSTEMS:  Review of Systems  Constitutional: Negative for chills and fever.  HENT: Negative for congestion and sore throat.   Eyes: Negative for blurred vision and double vision.  Respiratory: Negative for cough and shortness of breath.   Cardiovascular: Negative for chest pain, palpitations and leg swelling.  Gastrointestinal: Negative for abdominal pain, nausea and vomiting.  Genitourinary: Negative for dysuria and frequency.  Musculoskeletal: Negative for back pain and neck pain.  Neurological: Negative for dizziness and headaches.  Psychiatric/Behavioral: Negative for depression. The patient is not nervous/anxious.     DRUG ALLERGIES:   Allergies  Allergen Reactions  . Chlorthalidone Anaphylaxis, Itching and Rash  . Fentanyl Rash  . Midazolam Rash  . Ace Inhibitors Other (See Comments)    Reaction:  Hyperkalemia, agitation   . Angiotensin Receptor Blockers Other (See Comments)    Reaction:  Hyperkalemia, agitation   . Norvasc [Amlodipine] Itching and Rash  . Phenergan [Promethazine Hcl] Anxiety    "antsy, can't sit still"   VITALS:  Blood pressure (!) 150/57, pulse 70, temperature 98.2 F (36.8 C), temperature source Oral, resp. rate (!) 2, height 5' 4"  (1.626 m), weight 61 kg, SpO2 100 %. PHYSICAL EXAMINATION:  Physical Exam  GENERAL:  67 y.o.-year-old patient lying in the bed with no acute distress.  EYES: Pupils equal, round, reactive to light and accommodation. No scleral icterus. Extraocular muscles intact.  HEENT: Head atraumatic, normocephalic. Oropharynx and nasopharynx clear.  NECK:  Supple, no jugular venous distention. No thyroid enlargement, no tenderness.  LUNGS: Normal breath sounds bilaterally, no  wheezing, rales,rhonchi or crepitation. No use of accessory muscles of respiration.  CARDIOVASCULAR: RRR, S1, S2 normal. No murmurs, rubs, or gallops.  ABDOMEN: Soft, nontender, nondistended. Bowel sounds present. No organomegaly or mass.  EXTREMITIES: +weak pulse on right foot, right foot with necrosis of all 5 toes extending up the plantar surface. +left great toe amputation with some dorsal necrosis present NEUROLOGIC: Cranial nerves II through XII are intact. Muscle strength 5/5 in all extremities. Sensation intact. Gait not checked.  PSYCHIATRIC: The patient is alert and oriented x 3.  SKIN: see above LABORATORY PANEL:  Female CBC Recent Labs  Lab 02/18/18 0756  WBC 16.6*  HGB 8.0*  HCT 27.9*  PLT 403*   ------------------------------------------------------------------------------------------------------------------ Chemistries  Recent Labs  Lab 02/18/18 0756  NA 139  K 4.4  CL 97*  CO2 29  GLUCOSE 87  BUN 27*  CREATININE 5.23*  CALCIUM 8.7*  AST 22  ALT <5  ALKPHOS 154*  BILITOT 0.4   RADIOLOGY:  No results found. ASSESSMENT AND PLAN:   Bilateral lower extremity gangrene- s/p angiogram of RLE this morning with stents placed, also s/p left great toe amputation at last hospitalization. -Vascular and podiatry following -Anticipate that patient will likely need bilateral amputations -Continue vancomycin and cefepime, stop flagyl -Continue heparin drip  Recent history of endocarditis- s/p septic embolization treated with revascularization -Continue antibiotics as above  ESRD on HD TTS -Nephrology consulted -HD today  T2DM- blood sugars well-controlled -Continue lantus and SSI  Elevated troponin with history of CAD s/p CABG- stable, no active chest pain. Troponin likely elevated due to reduced renal clearance. -Trend troponins -Continue aspirin -  On heparin drip  Chronic diastolic CHF- no signs of volume overload -fluid management per nephro   All the  records are reviewed and case discussed with Care Management/Social Worker. Management plans discussed with the patient, family and they are in agreement.  CODE STATUS: Full Code  TOTAL TIME TAKING CARE OF THIS PATIENT: 40 minutes.   More than 50% of the time was spent in counseling/coordination of care: YES  POSSIBLE D/C in 3-4 DAYS, DEPENDING ON CLINICAL CONDITION.   Berna Spare Arnetia Bronk M.D on 02/19/2018 at 3:16 PM  Between 7am to 6pm - Pager - [phone]  After 6pm go to [email or web address] - Proofreader  [facility]  Hospitalists  Office  [phone]  CC: Primary care physician; System, Pcp Not In  Note: This dictation was prepared with Dragon dictation along with smaller phrase technology. Any transcriptional errors that result from this process are unintentional.

## 2018-02-19 NOTE — Transfer of Care (Signed)
Immediate Anesthesia Transfer of Care Note  Patient: Stephanie Ellis  Procedure(s) Performed: Lower Extremity Angiography (Right )  Patient Location: PACU  Anesthesia Type:General  Level of Consciousness: drowsy and responds to stimulation  Airway & Oxygen Therapy: Patient Spontanous Breathing and Patient connected to nasal cannula oxygen  Post-op Assessment: Report given to RN and Post -op Vital signs reviewed and stable  Post vital signs: Reviewed and stable  Last Vitals:  Vitals Value Taken Time  BP 145/55 02/19/2018 12:19 PM  Temp    Pulse 66 02/19/2018 12:19 PM  Resp 8 02/19/2018 12:19 PM  SpO2 100 % 02/19/2018 12:19 PM  Vitals shown include unvalidated device data.  Last Pain:  Vitals:   02/19/18 0803  TempSrc:   PainSc: 0-No pain         Complications: No apparent anesthesia complications

## 2018-02-19 NOTE — Anesthesia Preprocedure Evaluation (Signed)
Anesthesia Evaluation  Patient identified by MRN, date of birth, ID band Patient awake    Reviewed: Allergy & Precautions, NPO status , Patient's Chart, lab work & pertinent test results, reviewed documented beta blocker date and time   History of Anesthesia Complications (+) history of anesthetic complications  Airway Mallampati: II  TM Distance: >3 FB     Dental  (+) Chipped   Pulmonary           Cardiovascular hypertension, Pt. on medications and Pt. on home beta blockers + angina + CAD, + Past MI, + Cardiac Stents, + Peripheral Vascular Disease and +CHF  + dysrhythmias + Valvular Problems/Murmurs      Neuro/Psych  Neuromuscular disease    GI/Hepatic GERD  Controlled,  Endo/Other  diabetes, Type 2  Renal/GU ESRF and DialysisRenal disease     Musculoskeletal   Abdominal   Peds negative pediatric ROS (+)  Hematology  (+) anemia ,   Anesthesia Other Findings Past Medical History: No date: (HFpEF) heart failure with preserved ejection fraction ([place])     Comment:  a. 01/2018 Echo: EF 50-55%, no rwma, Gr1 DD, triv AI,               mild MR. Midly dil LA/RV, mod reduced RV fxn. Irreg               thickening of TV w/ mobile echodensity that appears to               arise from valve. No date: Anemia No date: Anorexia No date: Bacteremia due to Pseudomonas No date: Carotid arterial disease ([place])     Comment:  a. 01/2017 Carotid U/S: RICA <73, LICA <71. No date: Complication of anesthesia     Comment:  a. Pt reports h/o complication on 9 different occasions               - ? hypotension/arrest. 01/2015: Coronary artery disease involving left main coronary artery     Comment:  a. 01/2015 Cath [facility]): 70% LM, p-mLAD 50-60% (Resting               FFR 0.75), mRCA 80-90%, ~40 Ost OM & D1-->CABG; b.               01/2016 Staged PCI of LCX x 2 and RCA. No date: ESRD (end stage renal disease) on dialysis [facility])  Comment:  a. ESRD secondary to acute kidney failure s/p CABG-->PD No date: Essential hypertension No date: GERD (gastroesophageal reflux disease) No date: Heart murmur No date: Hypercholesterolemia 2016: Myocardial infarction ([place]) No date: PVD (peripheral vascular disease) ([place])     Comment:  a. 01/30/2018 PV Angio: Sev Left Popliteal dzs s/p PTA               w/ 63m lutonix DEB w/ mech aspiration of the L popliteal,              L AT, and Left tibioperoneal trunck and peroneal artery. 03/24/2015: S/P CABG x 3     Comment:  a.  [facility]: Dr. BMarland KitchenHaithcock: CABG x 3, LIMA to LAD,              SVG to RCA, [facility]: Sinusitis No date: Type II diabetes mellitus with complication ([place]     Comment:  CAD  Reproductive/Obstetrics  Anesthesia Physical  Anesthesia Plan  ASA: IV  Anesthesia Plan: General   Post-op Pain Management:    Induction: Intravenous  PONV Risk Score and Plan:   Airway Management Planned:   Additional Equipment:   Intra-op Plan:   Post-operative Plan:   Informed Consent: I have reviewed the patients History and Physical, chart, labs and discussed the procedure including the risks, benefits and alternatives for the proposed anesthesia with the patient or authorized representative who has indicated his/her understanding and acceptance.     Plan Discussed with: CRNA  Anesthesia Plan Comments:         Anesthesia Quick Evaluation

## 2018-02-19 NOTE — Progress Notes (Signed)
ANTICOAGULATION CONSULT NOTE - Initial Consult  Pharmacy Consult for Heparin Drip Indication: VTE treatment  Patient Measurements: Height: 5' 4"  (162.6 cm) Weight: 149 lb 4 oz (67.7 kg) IBW/kg (Calculated) : 54.7  Vital Signs: Temp: 98.8 F (37.1 C) (10/31 1900) Temp Source: Oral (10/31 1900) BP: 124/51 (10/31 2015) Pulse Rate: 72 (10/31 2015)  Labs: Recent Labs    02/18/18 0756 02/18/18 1828 02/19/18 0500 02/19/18 0900 02/19/18 1700  HGB 8.0*  --  6.4*  --   --   HCT 27.9*  --  22.6*  --   --   PLT 403*  --  325  --   --   APTT 45* 91*  --   --  136*  LABPROT 15.8*  --   --   --   --   INR 1.27  --   --   --   --   HEPARINUNFRC  --  0.71*  --   --  0.39  CREATININE 5.23*  --   --   --   --   TROPONINI 0.11*  --   --  0.07*  --     Medical History: Past Medical History:  Diagnosis Date  . (HFpEF) heart failure with preserved ejection fraction ([place])    a. 01/2018 Echo: EF 50-55%, no rwma, Gr1 DD, triv AI, mild MR. Midly dil LA/RV, mod reduced RV fxn. Irreg thickening of TV w/ mobile echodensity that appears to arise from valve.  . Anemia   . Anorexia   . Bacteremia due to Pseudomonas   . Carotid arterial disease ([place])    a. 01/2017 Carotid U/S: RICA <33, LICA <54.  Marland Kitchen Complication of anesthesia    a. Pt reports h/o complication on 9 different occasions - ? hypotension/arrest.  . 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 01/2015 Cath [facility]): 70% LM, p-mLAD 50-60% (Resting FFR 0.75), mRCA 80-90%, ~40 Ost OM & D1-->CABG; b. 01/2016 Staged PCI of LCX x 2 and RCA.  Marland Kitchen ESRD (end stage renal disease) on dialysis ([place])    a. ESRD secondary to acute kidney failure s/p CABG-->PD  . Essential hypertension   . GERD (gastroesophageal reflux disease)   . Heart murmur   . Hypercholesterolemia   . Myocardial infarction ([place]) 2016  . PVD (peripheral vascular disease) ([place])    a. 01/30/2018 PV Angio: Sev Left Popliteal dzs s/p PTA w/ 68m lutonix DEB  w/ mech aspiration of the L popliteal, L AT, and Left tibioperoneal trunck and peroneal artery.  . 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  [facility]: Dr. BMarland KitchenHaithcock: CABG x 3, LIMA to LAD, SVG to RCA, SVG to OM3, EVH  . Sinusitis 2019  . Type II diabetes mellitus with complication ([facility])    CAD    Medications:  Scheduled:  . aspirin EC  81 mg Oral Daily  . atorvastatin  80 mg Oral QHS  . calcitRIOL  0.5 mcg Oral Daily  . carvedilol  3.125 mg Oral BID WC  . Chlorhexidine Gluconate Cloth  6 each Topical Q0600  . docusate sodium  100 mg Oral BID  . famotidine  10 mg Oral Daily  . feeding supplement (NEPRO CARB STEADY)  237 mL Oral BID BM  . folic acid  1 mg Oral Daily  . insulin aspart  0-5 Units Subcutaneous QHS  . insulin aspart  0-9 Units Subcutaneous TID WC  . insulin glargine  4 Units Subcutaneous QHS  .  isosorbide mononitrate  30 mg Oral Daily  . midodrine  10 mg Oral Q M,W,F  . mirtazapine  7.5 mg Oral QHS  . multivitamin with minerals  2 tablet Oral Daily  . omega-3 acid ethyl esters  1 capsule Oral Daily  . sevelamer carbonate  800 mg Oral TID WC  . vitamin B-12  1,000 mcg Oral Daily   Infusions:  . ceFEPime (MAXIPIME) IV    . heparin 1,050 Units/hr (02/19/18 0746)  . metronidazole    . vancomycin      Assessment: Pharmacy consulted to dose and monitor heparin drip for this 67 yo female presenting to ED with limb threatening ischemia bilateral legs. Baseline troponin 0.11.  Vascular surgery procedure 10/31. She was on apixaban PTA. Baseline aPTT 45s, no baseline heparin level drawn.  She refuses all lab draws except for those done during HD sessions. This morning she was taken by vascular for an angioplasty and the infusion ran during the entire procedure. She is scheduled for HD this evening, therefore, aPTT and HL will be ordered with this session's labs.  Heparin course:  Initiation: 4000 unit bolus, then 1050 units/hr 10/30 1828 aPTT 91s, HL 0.71 10/31 APTT 136 HL  0.39   Goal of Therapy:  Heparin level 0.3-0.7 units/ml Monitor platelets by anticoagulation protocol: Yes   Plan:  Without a baseline HL or known last dose of apixaban, cannot be 100% sure if effect of apixaban is still being seen. However the two levels last night were very close to correlating and HL is no longer elevated. HL is typically more accurate. Therefore, I will leave heparin at same rate and attempt to check a level in the AM. If patient is still refusing labs and she has no other procedures planned, then she needs to be changed back to apixaban.  Ramond Dial, Pharm.D. Clinical Pharmacist 02/19/2018,8:39 PM

## 2018-02-19 NOTE — Progress Notes (Signed)
HD Treatment Initiated    02/19/18 1918  Vital Signs  Pulse Rate 72  Resp (!) 21  Oxygen Therapy  SpO2 96 %  During Hemodialysis Assessment  Blood Flow Rate (mL/min) 400 mL/min  Arterial Pressure (mmHg) -180 mmHg  Venous Pressure (mmHg) 130 mmHg  Transmembrane Pressure (mmHg) 40 mmHg  Ultrafiltration Rate (mL/min) 570 mL/min  Dialysate Flow Rate (mL/min) 600 ml/min  Conductivity: Machine  14.2  HD Safety Checks Performed Yes  Dialysis Fluid Bolus Normal Saline  Bolus Amount (mL) 250 mL  Intra-Hemodialysis Comments Progressing as prescribed  Hemodialysis Catheter Left Subclavian Double-lumen  Placement Date: 02/11/18   Placed prior to admission: No  Orientation: Left  Access Location: Subclavian  Hemodialysis Catheter Type: Double-lumen  Blue Lumen Status Infusing  Red Lumen Status Infusing

## 2018-02-19 NOTE — Progress Notes (Signed)
HD Pre Assessment    02/19/18 1900  Neurological  Level of Consciousness Alert  Orientation Level Oriented X4  Respiratory  Respiratory Pattern Regular;Unlabored;Symmetrical  Chest Assessment Chest expansion symmetrical  Bilateral Breath Sounds Clear;Diminished  Cardiac  Pulse Regular  Heart Sounds S1, S2  Jugular Venous Distention (JVD) No  ECG Monitor Yes  Cardiac Rhythm NSR  Antiarrhythmic device No  Vascular  R Dorsalis Pedis Pulse +1  R Posterior Tibial Pulse +1  L Posterior Tibial Pulse +1  Integumentary  Integumentary (WDL) X  Skin Color Appropriate for ethnicity  Skin Condition Dry;Flaky  Skin Integrity Intact  Musculoskeletal  Musculoskeletal (WDL) X  Generalized Weakness Yes  Gastrointestinal  Bowel Sounds Assessment Active  GU Assessment  Genitourinary (WDL) X (HD pt)  Genitourinary Symptoms Anuria  Peritoneal Catheter Left lower abdomen  No Placement Date or Time found.   Catheter Location: Left lower abdomen  Site Assessment Other (Comment) (Unable to assess)  Psychosocial  Psychosocial (WDL) WDL  Incision (Closed) 02/06/18 Foot Left  Date First Assessed/Time First Assessed: 02/06/18 0919   Location: Foot  Location Orientation: Left  Dressing Type Other (Comment) (None - OTA)  Closure Sutures

## 2018-02-19 NOTE — Anesthesia Post-op Follow-up Note (Signed)
Anesthesia QCDR form completed.        

## 2018-02-19 NOTE — Progress Notes (Signed)
HD tx end    02/19/18 2302  Vital Signs  Pulse Rate 73  Pulse Rate Source Monitor  Resp 18  BP (!) 143/55  BP Location Left Arm  BP Method Automatic  Patient Position (if appropriate) Lying  Oxygen Therapy  SpO2 96 %  O2 Device Room Air  During Hemodialysis Assessment  Dialysis Fluid Bolus Normal Saline  Bolus Amount (mL) 250 mL  Intra-Hemodialysis Comments Tx completed

## 2018-02-19 NOTE — Progress Notes (Signed)
Pre HD Treatment    02/19/18 1900  Vital Signs  Temp 98.8 F (37.1 C)  Temp Source Oral  Pulse Rate 74  Pulse Rate Source Monitor  Resp 20  BP 121/61  BP Location Left Arm  BP Method Automatic  Patient Position (if appropriate) Lying  Oxygen Therapy  SpO2 92 %  O2 Device Room Air  Pain Assessment  Pain Scale 0-10  Pain Score 0  Dialysis Weight  Weight 67.7 kg  Type of Weight Pre-Dialysis  Time-Out for Hemodialysis  What Procedure? HD  Pt Identifiers(min of two) First/Last Name;MRN/Account#;Pt's DOB(use if MRN/Acct# not available  Correct Site? Yes  Correct Side? Yes  Correct Procedure? Yes  Consents Verified? Yes  Rad Studies Available? N/A  Safety Precautions Reviewed? Yes  Engineer, civil (consulting) Number 4  Station Number 4  UF/Alarm Test Passed  Conductivity: Meter 13.9  Conductivity: Machine  14  pH 7.4  Reverse Osmosis Main  Normal Saline Lot Number [id number]  Dialyzer Lot Number [id number]  Disposable Set Lot Number [id number]  Machine Temperature 98.6 F (37 C)  Musician and Audible Yes  Blood Lines Intact and Secured Yes  Pre Treatment Patient Checks  Vascular access used during treatment Catheter  Hepatitis B Surface Antigen Results Negative  Date Hepatitis B Surface Antigen Drawn 11/26/17  Hepatitis B Surface Antibody  (>10)  Date Hepatitis B Surface Antibody Drawn 11/26/17  Hemodialysis Consent Verified Yes  Hemodialysis Standing Orders Initiated Yes  ECG (Telemetry) Monitor On Yes  Prime Ordered Normal Saline  Length of  DialysisTreatment -hour(s) 3.5 Hour(s)  Dialyzer Elisio 17H NR  Dialysate 3K, 2.5 Ca  Variable Sodium Other (Comment) (Na 0)  Dialysis Anticoagulant None  Dialysate Flow Ordered 600  Blood Flow Rate Ordered 300 mL/min  Ultrafiltration Goal 1.5 Liters  Pre Treatment Labs Renal panel;CBC;Other (Comment)  Dialysis Blood Pressure Support Ordered Normal Saline  Education / Care Plan  Dialysis Education Provided Yes   Documented Education in Care Plan Yes  Hemodialysis Catheter Left Subclavian Double-lumen  Placement Date: 02/11/18   Placed prior to admission: No  Orientation: Left  Access Location: Subclavian  Hemodialysis Catheter Type: Double-lumen  Site Condition No complications  Blue Lumen Status Capped (Central line)  Red Lumen Status Capped (Central line)  Purple Lumen Status N/A  Dressing Status Clean;Dry;Intact  Drainage Description None

## 2018-02-20 ENCOUNTER — Encounter: Payer: Self-pay | Admitting: Vascular Surgery

## 2018-02-20 LAB — CBC
HCT: 23.8 % — ABNORMAL LOW (ref 36.0–46.0)
Hemoglobin: 6.7 g/dL — ABNORMAL LOW (ref 12.0–15.0)
MCH: 29.4 pg (ref 26.0–34.0)
MCHC: 28.2 g/dL — ABNORMAL LOW (ref 30.0–36.0)
MCV: 104.4 fL — ABNORMAL HIGH (ref 80.0–100.0)
Platelets: 318 10*3/uL (ref 150–400)
RBC: 2.28 MIL/uL — ABNORMAL LOW (ref 3.87–5.11)
RDW: 21.4 % — ABNORMAL HIGH (ref 11.5–15.5)
WBC: 13.1 10*3/uL — ABNORMAL HIGH (ref 4.0–10.5)
nRBC: 0.2 % (ref 0.0–0.2)

## 2018-02-20 LAB — GLUCOSE, CAPILLARY
Glucose-Capillary: 128 mg/dL — ABNORMAL HIGH (ref 70–99)
Glucose-Capillary: 74 mg/dL (ref 70–99)
Glucose-Capillary: 83 mg/dL (ref 70–99)
Glucose-Capillary: 85 mg/dL (ref 70–99)

## 2018-02-20 LAB — RENAL FUNCTION PANEL
Albumin: 1.7 g/dL — ABNORMAL LOW (ref 3.5–5.0)
Anion gap: 10 (ref 5–15)
BUN: 16 mg/dL (ref 8–23)
CO2: 27 mmol/L (ref 22–32)
Calcium: 7.8 mg/dL — ABNORMAL LOW (ref 8.9–10.3)
Chloride: 100 mmol/L (ref 98–111)
Creatinine, Ser: 3.51 mg/dL — ABNORMAL HIGH (ref 0.44–1.00)
GFR calc Af Amer: 14 mL/min — ABNORMAL LOW (ref >60)
GFR calc non Af Amer: 12 mL/min — ABNORMAL LOW (ref >60)
Glucose, Bld: 156 mg/dL — ABNORMAL HIGH (ref 70–99)
Phosphorus: 2.2 mg/dL — ABNORMAL LOW (ref 2.5–4.6)
Potassium: 4.2 mmol/L (ref 3.5–5.1)
Sodium: 137 mmol/L (ref 135–145)

## 2018-02-20 LAB — PREPARE RBC (CROSSMATCH)

## 2018-02-20 LAB — HEPARIN LEVEL (UNFRACTIONATED): Heparin Unfractionated: 0.38 IU/mL (ref 0.30–0.70)

## 2018-02-20 MED ORDER — MIDODRINE HCL 5 MG PO TABS
10.0000 mg | ORAL_TABLET | ORAL | Status: DC
  Administered 2018-02-24 – 2018-02-28 (×3): 10 mg via ORAL
  Filled 2018-02-20 (×4): qty 2

## 2018-02-20 MED ORDER — METOCLOPRAMIDE HCL 5 MG/ML IJ SOLN: 10.0000 mg | Freq: Four times a day (QID) | INTRAMUSCULAR | Status: DC

## 2018-02-20 MED ORDER — VANCOMYCIN HCL IN DEXTROSE 750-5 MG/150ML-% IV SOLN
750.0000 mg | Freq: Once | INTRAVENOUS | Status: DC
  Filled 2018-02-20: qty 150

## 2018-02-20 MED ORDER — VANCOMYCIN HCL IN DEXTROSE 750-5 MG/150ML-% IV SOLN
750.0000 mg | INTRAVENOUS | Status: DC
  Administered 2018-02-21 – 2018-02-24 (×2): 750 mg via INTRAVENOUS
  Filled 2018-02-20 (×3): qty 150

## 2018-02-20 MED ORDER — SODIUM CHLORIDE 0.9% IV SOLUTION
Freq: Once | INTRAVENOUS | Status: AC
  Administered 2018-02-20: 16:00:00 via INTRAVENOUS

## 2018-02-20 MED ORDER — DEXTROSE 5 % IV SOLN
500.0000 mg | INTRAVENOUS | Status: DC
  Filled 2018-02-20 (×2): qty 0.5

## 2018-02-20 MED ORDER — METOCLOPRAMIDE HCL 5 MG/ML IJ SOLN
10.0000 mg | Freq: Four times a day (QID) | INTRAMUSCULAR | Status: DC | PRN
  Administered 2018-02-20 – 2018-02-26 (×3): 10 mg via INTRAVENOUS
  Filled 2018-02-20 (×3): qty 2

## 2018-02-20 NOTE — Consult Note (Signed)
Pharmacy Antibiotic Note  Stephanie Ellis is a 67 y.o. female admitted on 02/18/2018 with cellulitis and h/o endocarditis.  Pharmacy has been consulted for vancomycin and cefepime dosing. The plan is for a possible BKA due to extensive gangrenous tissue  Plan: 1) Vancomycin 718m IV T/Th/Sa with HD  She received 10068mon 10/30. Unclear why patient did not receive post HD dose on 10/31, therefore will order a single dose for today with the next scheduled HD 11/2. Vancomycin level to be drawn on 11/5  Goal pre-dialysis level < 20 - 25 mcg/mL  2) cefepime 50065mvery 24 hours. She received a 2 gram pre-op dose on 10/31  Height: 5' 4"  (162.6 cm) Weight: 151 lb 10.8 oz (68.8 kg) IBW/kg (Calculated) : 54.7  Temp (24hrs), Avg:98.6 F (37 C), Min:98 F (36.7 C), Max:99.4 F (37.4 C)  Recent Labs  Lab 02/15/18 0409 02/18/18 0754 02/18/18 0756 02/18/18 1546 02/19/18 0500 02/20/18 0340  WBC 19.1*  --  16.6*  --  13.2* 13.1*  CREATININE 4.07*  --  5.23*  --   --  3.51*  LATICACIDVEN  --  1.1  --  1.1  --   --     Estimated Creatinine Clearance: 14.8 mL/min (A) (by C-G formula based on SCr of 3.51 mg/dL (H)).     Antimicrobials this admission: Vanc 10/30>> Cefepime 10/30>> Flagyl 10/30>>  Microbiology results: 10/30 Bcx pending 10/25 Bcx NGF 10/25 UCX NGF 10/18 Wcx pan-S S aureus  Thank you for allowing pharmacy to be a part of this patient's care.  RodDallie PilesharmD 02/20/2018 12:38 PM

## 2018-02-20 NOTE — Care Management Important Message (Signed)
Copy of signed IM left with patient in room.  

## 2018-02-20 NOTE — Clinical Social Work Note (Signed)
Clinical Social Work Assessment  Patient Details  Name: Stephanie Ellis MRN: [id number] Date of Birth: 03/22/1951  Date of referral:  02/20/18               Reason for consult:  Discharge Planning                Permission sought to share information with:  Facility Sport and exercise psychologist, Family Supports Permission granted to share information::  Yes, Verbal Permission Granted  Name::        Agency::     Relationship::     Contact Information:     Housing/Transportation Living arrangements for the past 2 months:  [place], [place] of Information:  Patient, Adult Children Patient Interpreter Needed:  None Criminal Activity/Legal Involvement Pertinent to Current Situation/Hospitalization:  No - Comment as needed Significant Relationships:  None Lives with:  Adult Children Do you feel safe going back to the place where you live?  Yes Need for family participation in patient care:  Yes (Comment)  Care giving concerns:  Patient typically resides at home however last week she was sent to short term rehab at [facility].   Social Worker assessment / plan:  CSW spoke with patient and her daughter, Stephanie Ellis, this morning at bedside. Patient and daughter are wanting to return to [facility] when able. Patient's daughter was asking appropriate questions regarding bed status at [facility]. CSW informed patient and daughter that Magda Paganini at [facility] stated patient could return at discharge.   Employment status:    Insurance information:  Medicare PT Recommendations:    Information / Referral to community resources:     Patient/Family's Response to care:  Patient and daughter expressed appreciation for CSW assistance.   Patient/Family's Understanding of and Emotional Response to Diagnosis, Current Treatment, and Prognosis: Patient is stating that "I am doing what I can" in response to her hospitalization. Patient's daughter is naturally  concerned for her mother.    Emotional Assessment Appearance:  Appears stated age Attitude/Demeanor/Rapport:  (pleasant and cooperative) Affect (typically observed):  Accepting, Calm Orientation:  Oriented to Self, Oriented to Place, Oriented to Situation Alcohol / Substance use:  Not Applicable Psych involvement (Current and /or in the community):  No (Comment)  Discharge Needs  Concerns to be addressed:  Care Coordination Readmission within the last 30 days:  No Current discharge risk:  None Barriers to Discharge:  No Barriers Identified   Shela Leff, LCSW 02/20/2018, 10:40 AM

## 2018-02-20 NOTE — Progress Notes (Signed)
[facility]  ROUNDING NOTE   Subjective:   Hemodialysis treatment yesterday. Tolerated treatment well. UF of 1500  Daughter at bedside  Hgb 6.7  Objective:  Vital signs in last 24 hours:  Temp:  [98 F (36.7 C)-99.4 F (37.4 C)] 99.4 F (37.4 C) (11/01 1136) Pulse Rate:  [67-81] 76 (11/01 1136) Resp:  [2-28] 17 (11/01 1136) BP: (112-150)/(49-63) 120/59 (11/01 1136) SpO2:  [90 %-100 %] 98 % (11/01 1136) Weight:  [67.7 kg-68.8 kg] 68.8 kg (10/31 2312)  Weight change: 6.7 kg Filed Weights   02/18/18 0740 02/19/18 1900 02/19/18 2312  Weight: 61 kg 67.7 kg 68.8 kg    Intake/Output: I/O last 3 completed shifts: In: 2033.9 [P.O.:270; I.V.:230.9; IV [id number]:2878] Out: 6767 [Other:1557; Blood:5]   Intake/Output this shift:  Total I/O In: 120 [P.O.:120] Out: 0   Physical Exam: General: NAD  Head: Normocephalic, atraumatic. Moist oral mucosal membranes  Eyes: Anicteric  Neck: Supple, trachea midline  Lungs:  Clear to auscultation  Heart: Regular rate and rhythm  Abdomen:  Soft, nontender  Extremities: Right lower extremity gangrene, left foot cold   Neurologic: Nonfocal, moving all four extremities  Skin: No lesions  Access: PD catheter, left IJ tunneled catheter    Basic Metabolic Panel: Recent Labs  Lab 02/14/18 1421 02/15/18 0409 02/18/18 0756 02/20/18 0340  NA  --   --  139 137  K  --   --  4.4 4.2  CL  --   --  97* 100  CO2  --   --  29 27  GLUCOSE  --   --  87 156*  BUN  --   --  27* 16  CREATININE  --  4.07* 5.23* 3.51*  CALCIUM  --   --  8.7* 7.8*  PHOS 3.6  --   --  2.2*    Liver Function Tests: Recent Labs  Lab 02/18/18 0756 02/20/18 0340  AST 22  --   ALT <5  --   ALKPHOS 154*  --   BILITOT 0.4  --   PROT 7.4  --   ALBUMIN 2.0* 1.7*   No results for input(s): LIPASE, AMYLASE in the last 168 hours. No results for input(s): AMMONIA in the last 168 hours.  CBC: Recent Labs  Lab 02/15/18 0409 02/18/18 0756  02/19/18 0500 02/20/18 0340  WBC 19.1* 16.6* 13.2* 13.1*  NEUTROABS  --  13.4*  --   --   HGB 6.5* 8.0* 6.4* 6.7*  HCT 23.7* 27.9* 22.6* 23.8*  MCV 108.7* 103.3* 105.1* 104.4*  PLT 347 403* 325 318    Cardiac Enzymes: Recent Labs  Lab 02/18/18 0756 02/19/18 0900  TROPONINI 0.11* 0.07*    BNP: Invalid input(s): POCBNP  CBG: Recent Labs  Lab 02/19/18 1225 02/19/18 1650 02/19/18 2338 02/20/18 0726 02/20/18 1136  GLUCAP 76 134* 130* 128* 25    Microbiology: Results for orders placed or performed during the hospital encounter of 02/18/18  Blood Culture (routine x 2)     Status: None (Preliminary result)   Collection Time: 02/18/18  7:55 AM  Result Value Ref Range Status   Specimen Description BLOOD RIGHT AC  Final   Special Requests   Final    BOTTLES DRAWN AEROBIC AND ANAEROBIC Blood Culture results may not be optimal due to an excessive volume of blood received in culture bottles   Culture   Final    NO GROWTH 2 DAYS Performed at [facility], [address],  [place], [place] [address]    Report Status PENDING  Incomplete  Blood Culture (routine x 2)     Status: None (Preliminary result)   Collection Time: 02/18/18  7:55 AM  Result Value Ref Range Status   Specimen Description BLOOD RIGHT FA  Final   Special Requests   Final    BOTTLES DRAWN AEROBIC AND ANAEROBIC Blood Culture results may not be optimal due to an excessive volume of blood received in culture bottles   Culture   Final    NO GROWTH 2 DAYS Performed at [facility], [address] [place], [place] [address]    Report Status PENDING  Incomplete    Coagulation Studies: Recent Labs    02/18/18 0756  LABPROT 15.8*  INR 1.27    Urinalysis: No results for input(s): COLORURINE, LABSPEC, PHURINE, GLUCOSEU, HGBUR, BILIRUBINUR, KETONESUR, PROTEINUR, UROBILINOGEN, NITRITE, LEUKOCYTESUR in the last 72 hours.  Invalid input(s): APPERANCEUR    Imaging: No results  found.   Medications:   . ceFEPime (MAXIPIME) IV    . heparin 1,050 Units/hr (02/20/18 0518)  . metronidazole 500 mg (02/20/18 0510)  . vancomycin     . aspirin EC  81 mg Oral Daily  . atorvastatin  80 mg Oral QHS  . calcitRIOL  0.5 mcg Oral Daily  . carvedilol  3.125 mg Oral BID WC  . Chlorhexidine Gluconate Cloth  6 each Topical Q0600  . docusate sodium  100 mg Oral BID  . famotidine  10 mg Oral Daily  . feeding supplement (NEPRO CARB STEADY)  237 mL Oral BID BM  . folic acid  1 mg Oral Daily  . insulin aspart  0-5 Units Subcutaneous QHS  . insulin aspart  0-9 Units Subcutaneous TID WC  . insulin glargine  4 Units Subcutaneous QHS  . isosorbide mononitrate  30 mg Oral Daily  . midodrine  10 mg Oral Q M,W,F  . mirtazapine  7.5 mg Oral QHS  . multivitamin with minerals  2 tablet Oral Daily  . omega-3 acid ethyl esters  1 capsule Oral Daily  . sevelamer carbonate  800 mg Oral TID WC  . vitamin B-12  1,000 mcg Oral Daily   acetaminophen **OR** acetaminophen, LORazepam, nitroGLYCERIN, ondansetron **OR** ondansetron (ZOFRAN) IV, oxyCODONE  Assessment/ Plan:  [name] is a 67 y.o. african [place] female with end stage renal disease on peritoneal dialysis, hypertension, congestive heart failure, coronary artery disease status post CABG, diabetes mellitus type II insulin dependent, GERD, peripheral vascular disease admitted to [facility] on 01/29/2018 for leg pain.    [facility] TTS hemodialysis RIJ permcath Transitioned from PD to HD this month  1. End Stage Renal Disease with hyperkalemia: transitioned from PD to hemodialysis.  - Hemodialysis tomorrow  2.  Ischemic limb with osteomyelitis and peripheral vascular disease - heparin gtt.  - empiric metronidazole and cefepime.  - Appreciate vascular input. Angiogram with angioplasty   3. Anemia of chronic kidney disease:  Hemoglobin 6.7 - PRBC transfusion ordered.   4. Hypotension:   - Midodrine  before dialysis.   Stephanie Ellis, [facility]  11/1/201911:54 AM

## 2018-02-20 NOTE — Progress Notes (Signed)
ANTICOAGULATION CONSULT NOTE - Initial Consult  Pharmacy Consult for Heparin Drip Indication: VTE treatment  Patient Measurements: Height: 5' 4"  (162.6 cm) Weight: 151 lb 10.8 oz (68.8 kg) IBW/kg (Calculated) : 54.7  Vital Signs: Temp: 99.1 F (37.3 C) (11/01 0440) Temp Source: Oral (11/01 0440) BP: 129/52 (11/01 0440) Pulse Rate: 78 (11/01 0440)  Labs: Recent Labs    02/18/18 0756 02/18/18 1828 02/19/18 0500 02/19/18 0900 02/19/18 1700 02/20/18 0340  HGB 8.0*  --  6.4*  --   --  6.7*  HCT 27.9*  --  22.6*  --   --  23.8*  PLT 403*  --  325  --   --  318  APTT 45* 91*  --   --  136*  --   LABPROT 15.8*  --   --   --   --   --   INR 1.27  --   --   --   --   --   HEPARINUNFRC  --  0.71*  --   --  0.39 0.38  CREATININE 5.23*  --   --   --   --  3.51*  TROPONINI 0.11*  --   --  0.07*  --   --     Medical History: Past Medical History:  Diagnosis Date  . (HFpEF) heart failure with preserved ejection fraction ([place])    a. 01/2018 Echo: EF 50-55%, no rwma, Gr1 DD, triv AI, mild MR. Midly dil LA/RV, mod reduced RV fxn. Irreg thickening of TV w/ mobile echodensity that appears to arise from valve.  . Anemia   . Anorexia   . Bacteremia due to Pseudomonas   . Carotid arterial disease ([facility])    a. 01/2017 Carotid U/S: RICA <61, LICA <95.  Marland Kitchen Complication of anesthesia    a. Pt reports h/o complication on 9 different occasions - ? hypotension/arrest.  . 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 01/2015 Cath [facility]): 70% LM, p-mLAD 50-60% (Resting FFR 0.75), mRCA 80-90%, ~40 Ost OM & D1-->CABG; b. 01/2016 Staged PCI of LCX x 2 and RCA.  Marland Kitchen ESRD (end stage renal disease) on dialysis ([place])    a. ESRD secondary to acute kidney failure s/p CABG-->PD  . Essential hypertension   . GERD (gastroesophageal reflux disease)   . Heart murmur   . Hypercholesterolemia   . Myocardial infarction ([place]) 2016  . PVD (peripheral vascular disease) ([place])    a.  01/30/2018 PV Angio: Sev Left Popliteal dzs s/p PTA w/ 65m lutonix DEB w/ mech aspiration of the L popliteal, L AT, and Left tibioperoneal trunck and peroneal artery.  . 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  [facility]: Dr. BMarland KitchenHaithcock: CABG x 3, LIMA to LAD, SVG to RCA, SVG to OM3, EVH  . Sinusitis 2019  . Type II diabetes mellitus with complication (HCC)    CAD    Medications:  Scheduled:  . aspirin EC  81 mg Oral Daily  . atorvastatin  80 mg Oral QHS  . calcitRIOL  0.5 mcg Oral Daily  . carvedilol  3.125 mg Oral BID WC  . Chlorhexidine Gluconate Cloth  6 each Topical Q0600  . docusate sodium  100 mg Oral BID  . famotidine  10 mg Oral Daily  . feeding supplement (NEPRO CARB STEADY)  237 mL Oral BID BM  . folic acid  1 mg Oral Daily  . insulin aspart  0-5 Units Subcutaneous QHS  .  insulin aspart  0-9 Units Subcutaneous TID WC  . insulin glargine  4 Units Subcutaneous QHS  . isosorbide mononitrate  30 mg Oral Daily  . midodrine  10 mg Oral Q M,W,F  . mirtazapine  7.5 mg Oral QHS  . multivitamin with minerals  2 tablet Oral Daily  . omega-3 acid ethyl esters  1 capsule Oral Daily  . sevelamer carbonate  800 mg Oral TID WC  . vitamin B-12  1,000 mcg Oral Daily   Infusions:  . ceFEPime (MAXIPIME) IV    . heparin 1,050 Units/hr (02/20/18 0518)  . metronidazole 500 mg (02/20/18 0510)  . vancomycin      Assessment: Pharmacy consulted to dose and monitor heparin drip for this 67 yo female presenting to ED with limb threatening ischemia bilateral legs. Baseline troponin 0.11.  Vascular surgery procedure 10/31. She was on apixaban PTA. Baseline aPTT 45s, no baseline heparin level drawn.  She refuses all lab draws except for those done during HD sessions. This morning she was taken by vascular for an angioplasty and the infusion ran during the entire procedure. She is scheduled for HD this evening, therefore, aPTT and HL will be ordered with this session's labs.  Heparin course:   Initiation: 4000 unit bolus, then 1050 units/hr 10/30 1828 aPTT 91s, HL 0.71 10/31 APTT 136 HL 0.39   Goal of Therapy:  Heparin level 0.3-0.7 units/ml Monitor platelets by anticoagulation protocol: Yes   Plan:  Without a baseline HL or known last dose of apixaban, cannot be 100% sure if effect of apixaban is still being seen. However the two levels last night were very close to correlating and HL is no longer elevated. HL is typically more accurate. Therefore, I will leave heparin at same rate and attempt to check a level in the AM. If patient is still refusing labs and she has no other procedures planned, then she needs to be changed back to apixaban.  11/1 AM heparin level 0.38. Continue current regimen. Recheck heparin level and CBC with tomorrow AM labs.  Nyaja Dubuque S, Pharm.D. Clinical Pharmacist 02/20/2018,6:44 AM

## 2018-02-20 NOTE — NC FL2 (Signed)
[place] LEVEL OF CARE SCREENING TOOL     IDENTIFICATION  Patient Name: Stephanie Ellis Birthdate: [birth year] Sex: female Admission Date (Current Location): 02/18/2018  [place] and [place] Number:  Engineering geologist and Address:  [facility], [address] [place], [place] [address]      Provider Number: [id number]  Attending Physician Name and Address:  Fritzi Mandes, MD  Relative Name and Phone Number:       Current Level of Care: Hospital Recommended Level of Care: [place] Prior Approval Number:    Date Approved/Denied:   PASRR Number:    Discharge Plan: SNF    Current Diagnoses: Patient Active Problem List   Diagnosis Date Noted  . Ischemia of extremity 02/18/2018  . ESRD (end stage renal disease) ([place])   . Diabetic osteomyelitis ([place])   . Peripheral vascular disease ([place])   . Advanced care planning/counseling discussion   . Palliative care by specialist   . Goals of care, counseling/discussion   . Diabetes mellitus, type 2 ([place]) 01/30/2018  . Sepsis ([place]) 01/29/2018  . Peripheral neuropathy 06/17/2017  . Encephalopathy 02/06/2017  . Hypertensive emergency 02/06/2017  . Endophthalmitis, left eye 12/05/2016  . Vomiting 10/24/2016  . Hematuria 10/17/2016  . Acute lower UTI 10/17/2016  . Anesthesia complication 77/41/2878  . Chest pain with moderate risk for cardiac etiology 09/04/2016  . Steal syndrome dialysis vascular access, initial encounter ([place]) 06/18/2016  . 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 Coronary artery disease involving coronary bypass graft of native heart with angina pectoris ([place]) 02/10/2016  . Hypertension 02/02/2016  . Hyperlipidemia 02/02/2016  . Coronary artery disease involving left main coronary artery 10/04/2015  . Abdominal pain of unknown etiology 10/04/2015  . ESRD (end stage renal disease) on dialysis ([place])   . Type II diabetes mellitus with  complication ([place])   . NSTEMI (non-ST elevated myocardial infarction) ([place])   . Right sided abdominal pain   . Elevated troponin 10/03/2015  . 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 Chronic diastolic congestive heart failure ([place]) 04/19/2014  . Stage 5 chronic kidney disease on chronic dialysis ([place]) 04/19/2014  . Anemia 09/20/2013  . Routine health maintenance 01/29/2013    Orientation RESPIRATION BLADDER Height & Weight     Self, Situation, Time, Place  Normal Continent Weight: 151 lb 10.8 oz (68.8 kg) Height:  5' 4"  (162.6 cm)  BEHAVIORAL SYMPTOMS/MOOD NEUROLOGICAL BOWEL NUTRITION STATUS  (none) (none) Continent Diet(renal/carb)  AMBULATORY STATUS COMMUNICATION OF NEEDS Skin   Total Care Verbally Surgical wounds                       Personal Care Assistance Level of Assistance  Bathing, Feeding, Dressing Bathing Assistance: Maximum assistance Feeding assistance: Limited assistance Dressing Assistance: Maximum assistance     Functional Limitations Info  (none)          SPECIAL CARE FACTORS FREQUENCY                       Contractures Contractures Info: Not present    Additional Factors Info  Code Status Code Status Info: full             Current Medications (02/20/2018):  This is the current hospital active medication list Current Facility-Administered Medications  Medication Dose Route Frequency Provider Last Rate Last Dose  . acetaminophen (TYLENOL) tablet 650 mg  650 mg Oral Q6H PRN Algernon Huxley, MD  650 mg at 02/20/18 0036   Or  . acetaminophen (TYLENOL) suppository 650 mg  650 mg Rectal Q6H PRN Algernon Huxley, MD      . aspirin EC tablet 81 mg  81 mg Oral Daily Algernon Huxley, MD   81 mg at 02/20/18 0827  . atorvastatin (LIPITOR) tablet 80 mg  80 mg Oral QHS Algernon Huxley, MD   80 mg at 02/19/18 2335  . calcitRIOL (ROCALTROL) capsule 0.5 mcg  0.5 mcg Oral Daily Algernon Huxley, MD   0.5 mcg at 02/20/18 0829  . carvedilol (COREG) tablet 3.125 mg  3.125 mg  Oral BID WC Algernon Huxley, MD   3.125 mg at 02/20/18 0827  . ceFEPIme (MAXIPIME) 1 g in sodium chloride 0.9 % 100 mL IVPB  1 g Intravenous Q24H Algernon Huxley, MD      . Chlorhexidine Gluconate Cloth 2 % PADS 6 each  6 each Topical S9628 Algernon Huxley, MD   6 each at 02/20/18 0516  . docusate sodium (COLACE) capsule 100 mg  100 mg Oral BID Algernon Huxley, MD   100 mg at 02/20/18 3662  . famotidine (PEPCID) tablet 10 mg  10 mg Oral Daily Algernon Huxley, MD   10 mg at 02/20/18 0827  . feeding supplement (NEPRO CARB STEADY) liquid 237 mL  237 mL Oral BID BM Dew, Erskine Squibb, MD      . folic acid (FOLVITE) tablet 1 mg  1 mg Oral Daily Algernon Huxley, MD   1 mg at 02/20/18 0827  . heparin ADULT infusion 100 units/mL (25000 units/264m sodium chloride 0.45%)  1,050 Units/hr Intravenous Continuous DAlgernon Huxley MD 10.5 mL/hr at 02/20/18 0518 1,050 Units/hr at 02/20/18 0518  . insulin aspart (novoLOG) injection 0-5 Units  0-5 Units Subcutaneous QHS DAlgernon Huxley MD      . insulin aspart (novoLOG) injection 0-9 Units  0-9 Units Subcutaneous TID WC DAlgernon Huxley MD   1 Units at 02/20/18 [phone] . insulin glargine (LANTUS) injection 4 Units  4 Units Subcutaneous QHS DAlgernon Huxley MD   4 Units at 02/19/18 2341  . isosorbide mononitrate (IMDUR) 24 hr tablet 30 mg  30 mg Oral Daily DAlgernon Huxley MD   30 mg at 02/20/18 0827  . LORazepam (ATIVAN) injection 0.5 mg  0.5 mg Intravenous Q15 min PRN DAlgernon Huxley MD      . metroNIDAZOLE (FLAGYL) IVPB 500 mg  500 mg Intravenous Q8H Ravishankar, JJoellyn Quails MD 100 mL/hr at 02/20/18 0510 500 mg at 02/20/18 0510  . midodrine (PROAMATINE) tablet 10 mg  10 mg Oral Q M,W,F Dew, JErskine Squibb MD      . mirtazapine (REMERON) tablet 7.5 mg  7.5 mg Oral QHS DAlgernon Huxley MD   7.5 mg at 02/19/18 2335  . multivitamin with minerals tablet 2 tablet  2 tablet Oral Daily DAlgernon Huxley MD   2 tablet at 02/20/18 0827  . nitroGLYCERIN (NITROSTAT) SL tablet 0.4 mg  0.4 mg Sublingual Q5 min PRN DAlgernon Huxley MD       . omega-3 acid ethyl esters (LOVAZA) capsule 1 g  1 capsule Oral Daily DAlgernon Huxley MD   1 g at 02/20/18 0828  . ondansetron (ZOFRAN) tablet 4 mg  4 mg Oral Q6H PRN DAlgernon Huxley MD       Or  . ondansetron (ZOFRAN) injection 4 mg  4 mg Intravenous Q6H PRN  Algernon Huxley, MD   4 mg at 02/20/18 4320  . oxyCODONE (Oxy IR/ROXICODONE) immediate release tablet 5 mg  5 mg Oral Q6H PRN Algernon Huxley, MD   5 mg at 02/20/18 0518  . sevelamer carbonate (RENVELA) tablet 800 mg  800 mg Oral TID WC Algernon Huxley, MD   800 mg at 02/20/18 0826  . vancomycin (VANCOCIN) 500 mg in sodium chloride 0.9 % 100 mL IVPB  500 mg Intravenous Q T,Th,Sa-HD Dew, Erskine Squibb, MD      . vitamin B-12 (CYANOCOBALAMIN) tablet 1,000 mcg  1,000 mcg Oral Daily Algernon Huxley, MD   1,000 mcg at 02/20/18 0379     Discharge Medications: Please see discharge summary for a list of discharge medications.  Relevant Imaging Results:  Relevant Lab Results:   Additional Information    Shela Leff, LCSW

## 2018-02-20 NOTE — Progress Notes (Signed)
[place] [facility] [place] NAME: Stephanie Ellis    MR#:  [id number]  DATE OF BIRTH:  February 16, 1951  SUBJECTIVE:  complains of nausea. Angiogram went well.   Stephanie Ellis in the room  REVIEW OF SYSTEMS:  Review of Systems  Constitutional: Negative for chills and fever.  HENT: Negative for congestion and sore throat.   Eyes: Negative for blurred vision and double vision.  Respiratory: Negative for cough and shortness of breath.   Cardiovascular: Negative for chest pain, palpitations and leg swelling.  Gastrointestinal: Negative for abdominal pain, nausea and vomiting.  Genitourinary: Negative for dysuria and frequency.  Musculoskeletal: Negative for back pain and neck pain.  Neurological: Negative for dizziness and headaches.  Psychiatric/Behavioral: Negative for depression. The patient is not nervous/anxious.     DRUG ALLERGIES:   Allergies  Allergen Reactions  . Chlorthalidone Anaphylaxis, Itching and Rash  . Fentanyl Rash  . Midazolam Rash  . Ace Inhibitors Other (See Comments)    Reaction:  Hyperkalemia, agitation   . Angiotensin Receptor Blockers Other (See Comments)    Reaction:  Hyperkalemia, agitation   . Norvasc [Amlodipine] Itching and Rash  . Phenergan [Promethazine Hcl] Anxiety    "antsy, can't sit still"   VITALS:  Blood pressure (!) 120/59, pulse 76, temperature 99.4 F (37.4 C), temperature source Oral, resp. rate 17, height 5' 4"  (1.626 m), weight 68.8 kg, SpO2 98 %. PHYSICAL EXAMINATION:  Physical Exam  GENERAL:  67 y.o.-year-old patient lying in the bed with no acute distress.  EYES: Pupils equal, round, reactive to light and accommodation. No scleral icterus. Extraocular muscles intact.  HEENT: Head atraumatic, normocephalic. Oropharynx and nasopharynx clear.  NECK:  Supple, no jugular venous distention. No thyroid enlargement, no tenderness.  LUNGS: Normal breath sounds bilaterally, no wheezing, rales,rhonchi or  crepitation. No use of accessory muscles of respiration.  CARDIOVASCULAR: RRR, S1, S2 normal. No murmurs, rubs, or gallops.  ABDOMEN: Soft, nontender, nondistended. Bowel sounds present. No organomegaly or mass.  EXTREMITIES: +weak pulse on right foot, right foot with necrosis of all 5 toes extending up the plantar surface. +left great toe amputation with some dorsal necrosis present NEUROLOGIC: Cranial nerves II through XII are intact. gen weakness Sensation intact. Gait not checked.  PSYCHIATRIC: The patient is alert and oriented x 3.  SKIN: see above LABORATORY PANEL:  Female CBC Recent Labs  Lab 02/20/18 0340  WBC 13.1*  HGB 6.7*  HCT 23.8*  PLT 318   ------------------------------------------------------------------------------------------------------------------ Chemistries  Recent Labs  Lab 02/18/18 0756 02/20/18 0340  NA 139 137  K 4.4 4.2  CL 97* 100  CO2 29 27  GLUCOSE 87 156*  BUN 27* 16  CREATININE 5.23* 3.51*  CALCIUM 8.7* 7.8*  AST 22  --   ALT <5  --   ALKPHOS 154*  --   BILITOT 0.4  --    RADIOLOGY:  No results found. ASSESSMENT AND PLAN:  Stephanie Ellis  is a 67 y.o. 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  She was sent out to rehab on Eliquis.  She was brought back in with ischemia of the right foot and toes and also ischemia on the left foot and toe.  *Bilateral lower extremity gangrene- s/p angiogram of RLE this morning with stents placed, also s/p left great toe amputation at last hospitalization. -Vascular and podiatry following -Anticipate that patient will likely need bilateral  amputations -Continue vancomycin and cefepime, stop flagyl -Continue heparin drip  *Recent history of endocarditis- s/p septic embolization treated with revascularization -Continue antibiotics as above  *ESRD on HD TTS -Nephrology consulted  *T2DM- blood sugars  well-controlled -Continue lantus and SSI  * Elevated troponin with history of CAD s/p CABG- stable, no active chest pain. Troponin likely elevated due to reduced renal clearance. -Trend troponins -Continue aspirin -On heparin drip  * Chronic diastolic CHF- no signs of volume overload -fluid management per nephro   All the records are reviewed and case discussed with Care Management/Social Worker. Management plans discussed with the patient, family and they are in agreement.  CODE STATUS: Full Code  TOTAL TIME TAKING CARE OF THIS PATIENT: 40 minutes.   More than 50% of the time was spent in counseling/coordination of care: YES  POSSIBLE D/C in ?? DAYS, DEPENDING ON CLINICAL CONDITION.   Stephanie Ellis M.D on 02/20/2018 at 3:46 PM  Between 7am to 6pm - Pager - [phone]  After 6pm go to [email or web address] - Proofreader  [facility]  Office  [phone]  CC: Primary care physician; System, Pcp Not In  Note: This dictation was prepared with Dragon dictation along with smaller phrase technology. Any transcriptional errors that result from this process are unintentional.

## 2018-02-20 NOTE — Progress Notes (Signed)
[facility]  Daily Progress Note   Subjective: 1 Day Post-Op: Ultrasound guidance for vascular access left femoral artery, Catheter placement into right SFA from left femoral approach, Aortogram and selective right lower extremity angiogram, Catheter directed thrombolytic therapy to right popliteal artery with 8 mg of TPA, Mechanical thrombectomy with the penumbra cat 6 device to the right popliteal artery,  tibioperoneal trunk, posterior tibial artery, and peroneal artery, Percutaneous transluminal angioplasty of the right posterior tibial artery and tibioperoneal trunk with 3 mm diameter by 15 cm length angioplasty balloon, Percutaneous transluminal angioplasty of the right popliteal artery and distal SFA with 5 mm diameter by 22 cm length Lutonix drug-coated angioplasty balloon, Percutaneous transluminal angioplasty of the right mid and distal SFA for native disease with 5 mm diameter by 22 cm length Lutonix drug-coated angioplasty balloon, Viabahn stent placement to the right popliteal artery with 6 mm diameter by 10 cm length Viabahn stent for greater than 50% residual stenosis and thrombus after angioplasty, Percutaneous transluminal angioplasty of the right peroneal artery and tibioperoneal trunk with 3 mm diameter by 15 cm length angioplasty balloon with StarClose closure device left femoral artery.  Patient with discomfort to the bilateral legs. No issues overnight. Daughter at bedside. Dialysis yesterday without issue.   Objective: Vitals:   02/19/18 2315 02/19/18 2341 02/20/18 0440 02/20/18 1136  BP: (!) 137/50 (!) 141/63 (!) 129/52 (!) 120/59  Pulse: 76 76 78 76  Resp: (!) 24 16 14 17   Temp:  98 F (36.7 C) 99.1 F (37.3 C) 99.4 F (37.4 C)  TempSrc:  Oral Oral Oral  SpO2: 98% 97% 100% 98%  Weight:      Height:        Intake/Output Summary (Last 24 hours) at 02/20/2018 1203 Last data filed at 02/20/2018 1026 Gross per 24 hour  Intake 1753.69 ml  Output  1562 ml  Net 191.69 ml   Physical Exam: A&Ox3, NAD CV: RRR Pulmonary: CTA Bilaterally Abdomen: Soft, Nontender, Nondistended Vascular:  Right Lower Extremity: Thigh soft, calf soft. Warm down to about midfoot. Non-palpable pedal pulses. Dry gangrenous mummified toes 1-5. Discoloration up to midfoot.    Left Lower Extremity: Thigh soft, calf soft. Warm down to foot. Non-palpable pulses. Toes 3-4 looked perfused. Gangrenous tip of second toe. Amputation 1st toe site intact. No drainage.   Laboratory: CBC    Component Value Date/Time   WBC 13.1 (H) 02/20/2018 0340   HGB 6.7 (L) 02/20/2018 0340   HCT 23.8 (L) 02/20/2018 0340   PLT 318 02/20/2018 0340   BMET    Component Value Date/Time   NA 137 02/20/2018 0340   K 4.2 02/20/2018 0340   CL 100 02/20/2018 0340   CO2 27 02/20/2018 0340   GLUCOSE 156 (H) 02/20/2018 0340   BUN 16 02/20/2018 0340   CREATININE 3.51 (H) 02/20/2018 0340   CALCIUM 7.8 (L) 02/20/2018 0340   GFRNONAA 12 (L) 02/20/2018 0340   GFRAA 14 (L) 02/20/2018 0340   Assessment/Planning: 67 year old female with severe PAD s/p RLE angiogram in an attempt for limb salvage - POD #1 1) Right Lower Extremity: Extensive gangrenous changes to the right toes extending to about midfoot tracking toward heel. Patient is currently on heparin. I doubt patient will have viable tissue for a TMA. Most likely BKA next week. Will continue with heparin and see what the final demarcation is.  2) Left Lower Extremity: Toes 3-5 look perfused however now with gangrenous changes to 2nd tip.  Moderate edema. Possible TMA if physical exam worsens? 3) Will continue to follow.  Discussed with Dr. Ellis Parents Archie Shea PA-C 02/20/2018 12:03 PM

## 2018-02-21 LAB — RENAL FUNCTION PANEL
Albumin: 1.8 g/dL — ABNORMAL LOW (ref 3.5–5.0)
Anion gap: 12 (ref 5–15)
BUN: 26 mg/dL — ABNORMAL HIGH (ref 8–23)
CO2: 27 mmol/L (ref 22–32)
Calcium: 8.5 mg/dL — ABNORMAL LOW (ref 8.9–10.3)
Chloride: 100 mmol/L (ref 98–111)
Creatinine, Ser: 5.56 mg/dL — ABNORMAL HIGH (ref 0.44–1.00)
GFR calc Af Amer: 8 mL/min — ABNORMAL LOW (ref >60)
GFR calc non Af Amer: 7 mL/min — ABNORMAL LOW (ref >60)
Glucose, Bld: 123 mg/dL — ABNORMAL HIGH (ref 70–99)
Phosphorus: 4.1 mg/dL (ref 2.5–4.6)
Potassium: 4.7 mmol/L (ref 3.5–5.1)
Sodium: 139 mmol/L (ref 135–145)

## 2018-02-21 LAB — TYPE AND SCREEN
ABO/RH(D): B POS
Antibody Screen: NEGATIVE
Unit division: 0

## 2018-02-21 LAB — CBC
HCT: 28.3 % — ABNORMAL LOW (ref 36.0–46.0)
HCT: 25.7 % — ABNORMAL LOW (ref 36.0–46.0)
Hemoglobin: 8.5 g/dL — ABNORMAL LOW (ref 12.0–15.0)
Hemoglobin: 7.6 g/dL — ABNORMAL LOW (ref 12.0–15.0)
MCH: 30 pg (ref 26.0–34.0)
MCH: 30 pg (ref 26.0–34.0)
MCHC: 30 g/dL (ref 30.0–36.0)
MCHC: 29.6 g/dL — ABNORMAL LOW (ref 30.0–36.0)
MCV: 100 fL (ref 80.0–100.0)
MCV: 101.6 fL — ABNORMAL HIGH (ref 80.0–100.0)
Platelets: 321 10*3/uL (ref 150–400)
Platelets: 322 10*3/uL (ref 150–400)
RBC: 2.83 MIL/uL — ABNORMAL LOW (ref 3.87–5.11)
RBC: 2.53 MIL/uL — ABNORMAL LOW (ref 3.87–5.11)
RDW: 22.7 % — ABNORMAL HIGH (ref 11.5–15.5)
RDW: 22.5 % — ABNORMAL HIGH (ref 11.5–15.5)
WBC: 15.4 10*3/uL — ABNORMAL HIGH (ref 4.0–10.5)
WBC: 16.4 10*3/uL — ABNORMAL HIGH (ref 4.0–10.5)
nRBC: 0 % (ref 0.0–0.2)
nRBC: 0 % (ref 0.0–0.2)

## 2018-02-21 LAB — BPAM RBC
Blood Product Expiration Date: 201911252359
ISSUE DATE / TIME: 201911011720
Unit Type and Rh: 7300

## 2018-02-21 LAB — GLUCOSE, CAPILLARY
Glucose-Capillary: 137 mg/dL — ABNORMAL HIGH (ref 70–99)
Glucose-Capillary: 115 mg/dL — ABNORMAL HIGH (ref 70–99)
Glucose-Capillary: 150 mg/dL — ABNORMAL HIGH (ref 70–99)

## 2018-02-21 LAB — HEPARIN LEVEL (UNFRACTIONATED): Heparin Unfractionated: 0.45 IU/mL (ref 0.30–0.70)

## 2018-02-21 MED ORDER — SODIUM CHLORIDE 0.9 % IV SOLN
1.0000 g | INTRAVENOUS | Status: DC
  Administered 2018-02-21 – 2018-02-25 (×5): 1 g via INTRAVENOUS
  Filled 2018-02-21 (×6): qty 1

## 2018-02-21 MED ORDER — EPOETIN ALFA 10000 UNIT/ML IJ SOLN
10000.0000 [IU] | INTRAMUSCULAR | Status: DC
  Administered 2018-02-21 – 2018-02-28 (×4): 10000 [IU] via INTRAVENOUS

## 2018-02-21 NOTE — Anesthesia Postprocedure Evaluation (Signed)
Anesthesia Post Note  Patient: Stephanie Ellis  Procedure(s) Performed: Lower Extremity Angiography (Right )  Patient location during evaluation: PACU Anesthesia Type: General Level of consciousness: awake and alert and oriented Pain management: pain level controlled Vital Signs Assessment: post-procedure vital signs reviewed and stable Respiratory status: spontaneous breathing Cardiovascular status: blood pressure returned to baseline Anesthetic complications: no     Last Vitals:  Vitals:   02/21/18 1315 02/21/18 1341  BP: (!) 170/70 (!) 146/51  Pulse: 78 74  Resp: 18 16  Temp:  36.9 C  SpO2: 100% 95%    Last Pain:  Vitals:   02/21/18 1341  TempSrc: Oral  PainSc:                  Miosha Behe

## 2018-02-21 NOTE — Progress Notes (Signed)
[place] [facility] [place] NAME: Stephanie Ellis    MR#:  [id number]  DATE OF BIRTH:  December 17, 1950  SUBJECTIVE:   Eating lunch. Nausea improved. Angiogram went well.   Appears cheerful  REVIEW OF SYSTEMS:  Review of Systems  Constitutional: Negative for chills and fever.  HENT: Negative for congestion and sore throat.   Eyes: Negative for blurred vision and double vision.  Respiratory: Negative for cough and shortness of breath.   Cardiovascular: Negative for chest pain, palpitations and leg swelling.  Gastrointestinal: Negative for abdominal pain, nausea and vomiting.  Genitourinary: Negative for dysuria and frequency.  Musculoskeletal: Negative for back pain and neck pain.  Neurological: Negative for dizziness and headaches.  Psychiatric/Behavioral: Negative for depression. The patient is not nervous/anxious.     DRUG ALLERGIES:   Allergies  Allergen Reactions  . Chlorthalidone Anaphylaxis, Itching and Rash  . Fentanyl Rash  . Midazolam Rash  . Ace Inhibitors Other (See Comments)    Reaction:  Hyperkalemia, agitation   . Angiotensin Receptor Blockers Other (See Comments)    Reaction:  Hyperkalemia, agitation   . Norvasc [Amlodipine] Itching and Rash  . Phenergan [Promethazine Hcl] Anxiety    "antsy, can't sit still"   VITALS:  Blood pressure (!) 146/51, pulse 74, temperature 98.4 F (36.9 C), temperature source Oral, resp. rate 16, height 5' 4"  (1.626 m), weight 64.9 kg, SpO2 95 %. PHYSICAL EXAMINATION:  Physical Exam  GENERAL:  67 y.o.-year-old patient lying in the bed with no acute distress.  EYES: Pupils equal, round, reactive to light and accommodation. No scleral icterus. Extraocular muscles intact.  HEENT: Head atraumatic, normocephalic. Oropharynx and nasopharynx clear.  NECK:  Supple, no jugular venous distention. No thyroid enlargement, no tenderness.  LUNGS: Normal breath sounds bilaterally, no wheezing, rales,rhonchi or  crepitation. No use of accessory muscles of respiration.  CARDIOVASCULAR: RRR, S1, S2 normal. No murmurs, rubs, or gallops.  ABDOMEN: Soft, nontender, nondistended. Bowel sounds present. No organomegaly or mass.  EXTREMITIES: +weak pulse on right foot, right foot with necrosis of all 5 toes extending up the plantar surface. +left great toe amputation with some dorsal necrosis present NEUROLOGIC: Cranial nerves II through XII are intact. gen weakness Sensation intact. Gait not checked.  PSYCHIATRIC: The patient is alert and oriented x 3.  SKIN: see above LABORATORY PANEL:  Female CBC Recent Labs  Lab 02/21/18 0927  WBC 16.4*  HGB 7.6*  HCT 25.7*  PLT 322   ------------------------------------------------------------------------------------------------------------------ Chemistries  Recent Labs  Lab 02/18/18 0756  02/21/18 0927  NA 139   < > 139  K 4.4   < > 4.7  CL 97*   < > 100  CO2 29   < > 27  GLUCOSE 87   < > 123*  BUN 27*   < > 26*  CREATININE 5.23*   < > 5.56*  CALCIUM 8.7*   < > 8.5*  AST 22  --   --   ALT <5  --   --   ALKPHOS 154*  --   --   BILITOT 0.4  --   --    < > = values in this interval not displayed.   RADIOLOGY:  No results found. ASSESSMENT AND PLAN:  Stephanie Ellis  is a 67 y.o. 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  She was sent out to rehab on Eliquis.  She was brought back in with ischemia of the right foot and toes and also ischemia on the left foot and toe.  *Bilateral lower extremity gangrene- s/p angiogram of RLE this morning with stents placed, also s/p left great toe amputation at last hospitalization. -Vascular and podiatry following -Anticipate that patient will likely need bilateral amputations -Continue vancomycin and cefepime, stop flagyl -Continue heparin drip  *Recent history of endocarditis- s/p septic embolization treated with  revascularization -Continue antibiotics as above  *ESRD on HD TTS -Nephrology consulted  *T2DM- blood sugars well-controlled -Continue lantus and SSI  * Elevated troponin with history of CAD s/p CABG- stable, no active chest pain. Troponin likely elevated due to reduced renal clearance. -Trend troponins -Continue aspirin -On heparin drip  * Chronic diastolic CHF- no signs of volume overload -fluid management per nephro   All the records are reviewed and case discussed with Care Management/Social Worker. Management plans discussed with the patient, family and they are in agreement.  CODE STATUS: Full Code  TOTAL TIME TAKING CARE OF THIS PATIENT: 40 minutes.   More than 50% of the time was spent in counseling/coordination of care: YES  POSSIBLE D/C in ?? DAYS, DEPENDING ON CLINICAL CONDITION.   Fritzi Mandes M.D on 02/21/2018 at 2:51 PM  Between 7am to 6pm - Pager - [phone]  After 6pm go to [email or web address] - Proofreader  [facility]  Office  [phone]  CC: Primary care physician; System, Pcp Not In  Note: This dictation was prepared with Dragon dictation along with smaller phrase technology. Any transcriptional errors that result from this process are unintentional.

## 2018-02-21 NOTE — Progress Notes (Signed)
ANTICOAGULATION CONSULT NOTE - Initial Consult  Pharmacy Consult for Heparin Drip Indication: VTE treatment  Patient Measurements: Height: 5' 4"  (162.6 cm) Weight: 151 lb 10.8 oz (68.8 kg) IBW/kg (Calculated) : 54.7  Vital Signs: Temp: 98.2 F (36.8 C) (11/02 0519) Temp Source: Oral (11/02 0519) BP: 151/63 (11/02 0519) Pulse Rate: 75 (11/02 0519)  Labs: Recent Labs    02/18/18 0756  02/18/18 1828 02/19/18 0500 02/19/18 0900 02/19/18 1700 02/20/18 0340 02/21/18 0443  HGB 8.0*  --   --  6.4*  --   --  6.7*  --   HCT 27.9*  --   --  22.6*  --   --  23.8*  --   PLT 403*  --   --  325  --   --  318  --   APTT 45*  --  91*  --   --  136*  --   --   LABPROT 15.8*  --   --   --   --   --   --   --   INR 1.27  --   --   --   --   --   --   --   HEPARINUNFRC  --    < > 0.71*  --   --  0.39 0.38 0.45  CREATININE 5.23*  --   --   --   --   --  3.51*  --   TROPONINI 0.11*  --   --   --  0.07*  --   --   --    < > = values in this interval not displayed.    Medical History: Past Medical History:  Diagnosis Date  . (HFpEF) heart failure with preserved ejection fraction ([place])    a. 01/2018 Echo: EF 50-55%, no rwma, Gr1 DD, triv AI, mild MR. Midly dil LA/RV, mod reduced RV fxn. Irreg thickening of TV w/ mobile echodensity that appears to arise from valve.  . Anemia   . Anorexia   . Bacteremia due to Pseudomonas   . Carotid arterial disease ([place])    a. 01/2017 Carotid U/S: RICA <45, LICA <40.  Marland Kitchen Complication of anesthesia    a. Pt reports h/o complication on 9 different occasions - ? hypotension/arrest.  . 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 01/2015 Cath [facility]): 70% LM, p-mLAD 50-60% (Resting FFR 0.75), mRCA 80-90%, ~40 Ost OM & D1-->CABG; b. 01/2016 Staged PCI of LCX x 2 and RCA.  Marland Kitchen ESRD (end stage renal disease) on dialysis ([place])    a. ESRD secondary to acute kidney failure s/p CABG-->PD  . Essential hypertension   . GERD (gastroesophageal  reflux disease)   . Heart murmur   . Hypercholesterolemia   . Myocardial infarction ([place]) 2016  . PVD (peripheral vascular disease) ([place])    a. 01/30/2018 PV Angio: Sev Left Popliteal dzs s/p PTA w/ 20m lutonix DEB w/ mech aspiration of the L popliteal, L AT, and Left tibioperoneal trunck and peroneal artery.  . 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  [facility]: Dr. BMarland KitchenHaithcock: CABG x 3, LIMA to LAD, SVG to RCA, SVG to OM3, EVH  . Sinusitis 2019  . Type II diabetes mellitus with complication (HCC)    CAD    Medications:  Scheduled:  . aspirin EC  81 mg Oral Daily  . atorvastatin  80 mg Oral QHS  . calcitRIOL  0.5 mcg Oral Daily  .  carvedilol  3.125 mg Oral BID WC  . ceFEPime (MAXIPIME) IV  500 mg Intravenous Q24H  . Chlorhexidine Gluconate Cloth  6 each Topical Q0600  . docusate sodium  100 mg Oral BID  . famotidine  10 mg Oral Daily  . feeding supplement (NEPRO CARB STEADY)  237 mL Oral BID BM  . folic acid  1 mg Oral Daily  . insulin aspart  0-5 Units Subcutaneous QHS  . insulin aspart  0-9 Units Subcutaneous TID WC  . insulin glargine  4 Units Subcutaneous QHS  . isosorbide mononitrate  30 mg Oral Daily  . midodrine  10 mg Oral Q T,Th,Sa-HD  . mirtazapine  7.5 mg Oral QHS  . multivitamin with minerals  2 tablet Oral Daily  . omega-3 acid ethyl esters  1 capsule Oral Daily  . sevelamer carbonate  800 mg Oral TID WC  . vitamin B-12  1,000 mcg Oral Daily   Infusions:  . heparin 1,050 Units/hr (02/21/18 0428)  . metronidazole Stopped (02/20/18 2229)  . vancomycin    . vancomycin      Assessment: Pharmacy consulted to dose and monitor heparin drip for this 67 yo female presenting to ED with limb threatening ischemia bilateral legs. Baseline troponin 0.11.  Vascular surgery procedure 10/31. She was on apixaban PTA. Baseline aPTT 45s, no baseline heparin level drawn.  She refuses all lab draws except for those done during HD sessions. This morning she was taken by vascular for an  angioplasty and the infusion ran during the entire procedure. She is scheduled for HD this evening, therefore, aPTT and HL will be ordered with this session's labs.  Heparin course:  Initiation: 4000 unit bolus, then 1050 units/hr 10/30 1828 aPTT 91s, HL 0.71 10/31 APTT 136 HL 0.39   Goal of Therapy:  Heparin level 0.3-0.7 units/ml Monitor platelets by anticoagulation protocol: Yes   Plan:  Without a baseline HL or known last dose of apixaban, cannot be 100% sure if effect of apixaban is still being seen. However the two levels last night were very close to correlating and HL is no longer elevated. HL is typically more accurate. Therefore, I will leave heparin at same rate and attempt to check a level in the AM. If patient is still refusing labs and she has no other procedures planned, then she needs to be changed back to apixaban.  11/1 AM heparin level 0.38. Continue current regimen. Recheck heparin level and CBC with tomorrow AM labs.  11/02 AM heparin level 0.45. Continue current regimen. Recheck heparin level and CBC with tomorrow AM labs.  Rheta Hemmelgarn S, Pharm.D. Clinical Pharmacist 02/21/2018,5:42 AM

## 2018-02-21 NOTE — Progress Notes (Signed)
Hd start 

## 2018-02-21 NOTE — Consult Note (Signed)
Pharmacy Antibiotic Note  Stephanie Ellis is a 67 y.o. female admitted on 02/18/2018 with cellulitis and h/o endocarditis.  Pharmacy has been consulted for vancomycin and cefepime dosing. The plan is for a possible BKA due to extensive gangrenous tissue  Plan: 1) Vancomycin 769m IV T/Th/Sa with HD  She received 10068mon 10/30. Unclear why patient did not receive post HD dose on 10/31 or why dose was not given on 11/1. Dose scheduled w/HD today. Vancomycin level to be drawn on 11/5  Goal pre-dialysis level < 20 - 25 mcg/mL  2) cefepime 1g every 24 hours. She received a 2 gram pre-op dose on 10/31. Dose missed 11/1, unclear why.   Height: 5' 4"  (162.6 cm) Weight: 146 lb 9.7 oz (66.5 kg) IBW/kg (Calculated) : 54.7  Temp (24hrs), Avg:98.9 F (37.2 C), Min:98 F (36.7 C), Max:100.4 F (38 C)  Recent Labs  Lab 02/15/18 0409 02/18/18 0754 02/18/18 0756 02/18/18 1546 02/19/18 0500 02/20/18 0340 02/21/18 0443  WBC 19.1*  --  16.6*  --  13.2* 13.1* 15.4*  CREATININE 4.07*  --  5.23*  --   --  3.51*  --   LATICACIDVEN  --  1.1  --  1.1  --   --   --     Estimated Creatinine Clearance: 14.6 mL/min (A) (by C-G formula based on SCr of 3.51 mg/dL (H)).     Antimicrobials this admission: Vanc 10/30>> Cefepime 10/30>> Flagyl 10/30>>  Microbiology results: 10/30 Bcx pending 10/25 Bcx NGF 10/25 UCX NGF 10/18 Wcx pan-S S aureus  Thank you for allowing pharmacy to be a part of this patient's care.  ShPernell DuprePharmD, BCPS Clinical Pharmacist 02/21/2018 10:46 AM

## 2018-02-21 NOTE — Progress Notes (Signed)
Subjective: Interval History: has no complaints of foot pain, malodorous drainage.  Difficulty with sensation, postop day 2 from intervention  Objective: Vital signs in last 24 hours: Temp:  [98.2 F (36.8 C)-100.4 F (38 C)] 98.2 F (36.8 C) (11/02 0519) Pulse Rate:  [75-77] 75 (11/02 0519) Resp:  [12-20] 18 (11/02 0519) BP: (105-151)/(48-63) 151/63 (11/02 0519) SpO2:  [92 %-98 %] 98 % (11/02 0519)  Intake/Output from previous day: 11/01 0701 - 11/02 0700 In: 1185.6 [P.O.:360; I.V.:241.6; Blood:387; IV Piggyback:197] Out: 0  Intake/Output this shift: No intake/output data recorded.  General appearance: alert, cooperative and no distress Neck: no JVD and supple, symmetrical, trachea midline Extremities: Areas of demarcation on the right foot, no fluctuance, no obvious drainage, expressible or passive.  On the left foot, dressing in place Pulses: 2+ and symmetric Pulseless feet due to severe medial calcification Skin: Skin color, texture, turgor normal. No rashes or lesions or Nonviable tissue right foot, unstageable, both on dorsal and plantar surfaces, extending to the heel, no expressible drainage  Lab Results: Recent Labs    02/20/18 0340 02/21/18 0443  WBC 13.1* 15.4*  HGB 6.7* 8.5*  HCT 23.8* 28.3*  PLT 318 321   BMET Recent Labs    02/20/18 0340  NA 137  K 4.2  CL 100  CO2 27  GLUCOSE 156*  BUN 16  CREATININE 3.51*  CALCIUM 7.8*    Studies/Results: Dg Tibia/fibula Left  Result Date: 01/29/2018 CLINICAL DATA:  Presents with ED via ems from home Having pain to both lower legs But states left leg is worse Increased pain with palpation Decreased pulses to foot EXAM: LEFT TIBIA AND FIBULA - 2 VIEW COMPARISON:  None. FINDINGS: No fracture.  No bone lesion. Knee and ankle joints are normally aligned. There are arterial vascular calcifications. Soft tissues are otherwise unremarkable. IMPRESSION: Negative Electronically Signed   By: Lajean Manes M.D.   On:  01/29/2018 20:58   Ct Chest W Contrast  Result Date: 02/12/2018 CLINICAL DATA:  Recent placement of PermCath with an episode of hemoptysis. Patient also presenting with lethargy and signs of sepsis. EXAM: CT CHEST WITH CONTRAST TECHNIQUE: Multidetector CT imaging of the chest was performed during intravenous contrast administration. CONTRAST:  68m OMNIPAQUE IOHEXOL 300 MG/ML  SOLN COMPARISON:  Chest radiograph 02/11/2018 FINDINGS: Cardiovascular: Dual lumen central venous catheter from left IJ approach terminates in the right atrium. There is a small amount of subcutaneous emphysema adjacent to the skin entrance site. No perivascular hematoma is seen along the catheter course. Enlarged cardiac silhouette. Heavy calcific atherosclerotic disease of the coronary arteries and aorta. Post CABG postsurgical changes. Mediastinum/Nodes: No evidence of lymphadenopathy. 12 mm circumscribed hypoattenuated left thyroid nodule. Lungs/Pleura: No evidence of pneumothorax or pleural effusion. Linear airspace opacities in bilateral lower lungs may represent atelectasis versus scarring. Punctate calcifications are noted along bilateral pleural surfaces. Upper Abdomen: Scattered calcifications throughout the spleen. Punctate vascular calcifications versus small nonobstructive calculi in bilateral kidneys. Musculoskeletal: No chest wall abnormality. No acute or significant osseous findings. IMPRESSION: Dual lumen left IJ approach central venous catheter terminates in the right atrium. Small amount of subcutaneous emphysema adjacent to the skin entrance site, expected postprocedural finding. No evidence of perivascular hematoma. No evidence of pneumothorax. Bilateral lower lobes pulmonary scarring versus atelectasis. Scattered calcifications along the pleural surfaces in bilateral lower lobes may represent evidence of prior granulomatous disease or asbestos exposure. Heavy calcific atherosclerotic disease of the aorta and coronary  arteries. Postsurgical changes of CABG. Cardiomegaly. 12  mm left thyroid nodule. If further imaging is desired, dedicated thyroid ultrasound may be considered. Aortic Atherosclerosis (ICD10-I70.0). Electronically Signed   By: Fidela Salisbury M.D.   On: 02/12/2018 11:13   US Venous Img Lower Unilateral Left  Result Date: 01/29/2018 CLINICAL DATA:  Left leg pain EXAM: LEFT LOWER EXTREMITY VENOUS DOPPLER ULTRASOUND TECHNIQUE: Gray-scale sonography with graded compression, as well as color Doppler and duplex ultrasound were performed to evaluate the lower extremity deep venous systems from the level of the common femoral vein and including the common femoral, femoral, profunda femoral, popliteal and calf veins including the posterior tibial, peroneal and gastrocnemius veins when visible. The superficial great saphenous vein was also interrogated. Spectral Doppler was utilized to evaluate flow at rest and with distal augmentation maneuvers in the common femoral, femoral and popliteal veins. COMPARISON:  None. FINDINGS: Contralateral Common Femoral Vein: Respiratory phasicity is normal and symmetric with the symptomatic side. No evidence of thrombus. Normal compressibility. Common Femoral Vein: No evidence of thrombus. Normal compressibility, respiratory phasicity and response to augmentation. Saphenofemoral Junction: No evidence of thrombus. Normal compressibility and flow on color Doppler imaging. Profunda Femoral Vein: No evidence of thrombus. Normal compressibility and flow on color Doppler imaging. Femoral Vein: No evidence of thrombus. Normal compressibility, respiratory phasicity and response to augmentation. Popliteal Vein: No evidence of thrombus. Normal compressibility, respiratory phasicity and response to augmentation. Calf Veins: No evidence of thrombus. Normal compressibility and flow on color Doppler imaging. Superficial Great Saphenous Vein: No evidence of thrombus. Normal compressibility. Venous  Reflux:  None. Other Findings:  None. IMPRESSION: No evidence of deep venous thrombosis. Electronically Signed   By: Rolm Baptise M.D.   On: 01/29/2018 18:36   Dg Chest Port 1 View  Result Date: 02/11/2018 CLINICAL DATA:  Hemoptysis EXAM: PORTABLE CHEST 1 VIEW COMPARISON:  10/15/2017 FINDINGS: Left IJ dialysis catheter tips proximal mid right atrium. Stable cardiomegaly without CHF. Chronic left basilar scarring as before with blunting of left costophrenic angle. No definite superimposed acute process or CHF. No pneumothorax. Trachea is midline. Previous median sternotomy noted. Aorta atherosclerotic. Degenerative changes noted of the spine. IMPRESSION: Stable cardiomegaly and left basilar scarring. No interval change or superimposed acute process. Electronically Signed   By: Jerilynn Mages.  Shick M.D.   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 Diabetes and end-stage renal disease. EXAM: RIGHT FOOT COMPLETE - 3+ VIEW COMPARISON:  None. FINDINGS: The bones appear demineralized. There is no evidence of acute fracture, dislocation or bone destruction. There is an old healed fracture of the distal 5th metatarsal shaft. There is moderate joint space narrowing, osteophyte formation and mild hallux valgus deformity of the 1st MTP joint. There are lobulated calcifications within the 1st web space which could reflect hydroxyapatite deposition. Prominent vascular calcifications are noted. There is an additional lobular calcification in the plantar aspect of the hindfoot adjacent to the plantar artery which could reflect a small aneurysm. IMPRESSION: 1. No acute osseous findings or radiographic evidence of osteomyelitis. 2. 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 Additional calcification in the 1st web space could be vascular or hydroxyapatite deposition. Electronically Signed   By: Richardean Sale M.D.    On: 02/18/2018 09:33   Dg Toe Great Left  Result Date: 01/29/2018 CLINICAL DATA:  Open wound to the left great toe for several months. EXAM: LEFT GREAT TOE COMPARISON:  10/07/2017 FINDINGS: Degenerative  changes in the first metatarsal-phalangeal joint. Soft tissue defect over the plantar aspect of the left first toe. There is underlying erosion of the distal phalangeal tuft cortex suggesting focal osteomyelitis. No evidence of acute fracture or dislocation. Diffuse bone demineralization. Prominent vascular calcifications. IMPRESSION: Soft tissue defect over the plantar aspect of the left first toe with underlying erosion of the distal phalangeal tuft suggesting osteomyelitis. Electronically Signed   By: Lucienne Capers M.D.   On: 01/29/2018 21:30   Anti-infectives: Anti-infectives (From admission, onward)   Start     Dose/Rate Route Frequency Ordered Stop   02/21/18 1200  vancomycin (VANCOCIN) IVPB 750 mg/150 ml premix     750 mg 150 mL/hr over 60 Minutes Intravenous Every T-Th-Sa (Hemodialysis) 02/20/18 1244     02/20/18 1800  ceFEPIme (MAXIPIME) 500 mg in dextrose 5 % 50 mL IVPB     500 mg 100 mL/hr over 30 Minutes Intravenous Every 24 hours 02/20/18 1250     02/20/18 1600  vancomycin (VANCOCIN) IVPB 750 mg/150 ml premix     750 mg 150 mL/hr over 60 Minutes Intravenous  Once 02/20/18 1258     02/19/18 2200  metroNIDAZOLE (FLAGYL) IVPB 500 mg     500 mg 100 mL/hr over 60 Minutes Intravenous Every 8 hours 02/19/18 1639     02/19/18 1645  metroNIDAZOLE (FLAGYL) IVPB 500 mg  Status:  Discontinued     500 mg 100 mL/hr over 60 Minutes Intravenous Every 8 hours 02/19/18 1636 02/19/18 1639   02/19/18 1200  vancomycin (VANCOCIN) IVPB 500 mg/100 ml premix  Status:  Discontinued     500 mg 100 mL/hr over 60 Minutes Intravenous Every T-Th-Sa (Hemodialysis) 02/18/18 1338 02/18/18 1536   02/19/18 1200  ceFEPIme (MAXIPIME) 1 g in sodium chloride 0.9 % 100 mL IVPB  Status:  Discontinued     1  g 200 mL/hr over 30 Minutes Intravenous Every 24 hours 02/18/18 1338 02/20/18 1250   02/19/18 1200  vancomycin (VANCOCIN) 500 mg in sodium chloride 0.9 % 500 mL IVPB  Status:  Discontinued     500 mg 250 mL/hr over 120 Minutes Intravenous Every T-Th-Sa (Hemodialysis) 02/18/18 1537 02/18/18 1547   02/19/18 1200  vancomycin (VANCOCIN) 500 mg in sodium chloride 0.9 % 100 mL IVPB  Status:  Discontinued     500 mg 100 mL/hr over 60 Minutes Intravenous Every T-Th-Sa (Hemodialysis) 02/18/18 1547 02/20/18 1244   02/18/18 2211  ceFAZolin (ANCEF) IVPB 2g/100 mL premix     2 g 200 mL/hr over 30 Minutes Intravenous 30 min pre-op 02/18/18 2211 02/19/18 1137   02/18/18 1330  vancomycin (VANCOCIN) 500 mg in sodium chloride 0.9 % 100 mL IVPB     500 mg 100 mL/hr over 60 Minutes Intravenous  Once 02/18/18 1320 02/18/18 1554   02/18/18 0800  ceFEPIme (MAXIPIME) 2 g in sodium chloride 0.9 % 100 mL IVPB     2 g 200 mL/hr over 30 Minutes Intravenous  Once 02/18/18 0748 02/18/18 0847   02/18/18 0800  metroNIDAZOLE (FLAGYL) IVPB 500 mg  Status:  Discontinued     500 mg 100 mL/hr over 60 Minutes Intravenous Every 8 hours 02/18/18 0748 02/19/18 1516   02/18/18 0800  vancomycin (VANCOCIN) IVPB 1000 mg/200 mL premix     1,000 mg 200 mL/hr over 60 Minutes Intravenous  Once 02/18/18 0748 02/18/18 1003      Assessment/Plan: s/p Procedure(s): Lower Extremity Angiography (Right) Await demarcation, to guide therapy for her right side.  It  looks as if it would be difficult for her salvage weightbearing surface.  If this is the case then below-knee amputation would be indicated.  She is on a heparin drip currently, again waiting for demarcation to guide therapy.  She is on aspirin and Eliquis at home   LOS: 3 days   Stephanie Ellis A [facility] 02/21/2018, 8:41 AM

## 2018-02-21 NOTE — Progress Notes (Signed)
[facility]  ROUNDING NOTE   Subjective:   Seen and examined on hemodialysis. Tolerating treatment well. Moving all four extremities. No complaints.    HEMODIALYSIS FLOWSHEET:  Blood Flow Rate (mL/min): 400 mL/min Arterial Pressure (mmHg): -210 mmHg Venous Pressure (mmHg): 150 mmHg Transmembrane Pressure (mmHg): 50 mmHg Ultrafiltration Rate (mL/min): 720 mL/min Dialysate Flow Rate (mL/min): 600 ml/min Conductivity: Machine : 14.2 Conductivity: Machine : 14.2 Dialysis Fluid Bolus: Normal Saline Bolus Amount (mL): 250 mL   Objective:  Vital signs in last 24 hours:  Temp:  [98 F (36.7 C)-100.4 F (38 C)] 98 F (36.7 C) (11/02 0930) Pulse Rate:  [62-99] 73 (11/02 1130) Resp:  [12-20] 18 (11/02 1130) BP: (105-163)/(43-125) 157/76 (11/02 1130) SpO2:  [92 %-100 %] 96 % (11/02 1130) Weight:  [66.5 kg] 66.5 kg (11/02 0910)  Weight change:  Filed Weights   02/19/18 1900 02/19/18 2312 02/21/18 0910  Weight: 67.7 kg 68.8 kg 66.5 kg    Intake/Output: I/O last 3 completed shifts: In: 1425.6 [P.O.:600; I.V.:241.6; Blood:387; IV Piggyback:197] Out: 5374 [[id number]]   Intake/Output this shift:  No intake/output data recorded.  Physical Exam: General: NAD  Head: Normocephalic, atraumatic. Moist oral mucosal membranes  Eyes: Anicteric  Neck: Supple, trachea midline  Lungs:  Clear to auscultation  Heart: Regular rate and rhythm  Abdomen:  Soft, nontender  Extremities: Right lower extremity gangrene, left foot cold   Neurologic: Nonfocal, moving all four extremities  Skin: No lesions  Access: PD catheter, left IJ tunneled catheter    Basic Metabolic Panel: Recent Labs  Lab 02/14/18 1421 02/15/18 0409 02/18/18 0756 02/20/18 0340 02/21/18 0927  NA  --   --  139 137 139  K  --   --  4.4 4.2 4.7  CL  --   --  97* 100 100  CO2  --   --  29 27 27   GLUCOSE  --   --  87 156* 123*  BUN  --   --  27* 16 26*  CREATININE  --  4.07* 5.23* 3.51* 5.56*  CALCIUM   --   --  8.7* 7.8* 8.5*  PHOS 3.6  --   --  2.2* 4.1    Liver Function Tests: Recent Labs  Lab 02/18/18 0756 02/20/18 0340 02/21/18 0927  AST 22  --   --   ALT <5  --   --   ALKPHOS 154*  --   --   BILITOT 0.4  --   --   PROT 7.4  --   --   ALBUMIN 2.0* 1.7* 1.8*   No results for input(s): LIPASE, AMYLASE in the last 168 hours. No results for input(s): AMMONIA in the last 168 hours.  CBC: Recent Labs  Lab 02/18/18 0756 02/19/18 0500 02/20/18 0340 02/21/18 0443 02/21/18 0927  WBC 16.6* 13.2* 13.1* 15.4* 16.4*  NEUTROABS 13.4*  --   --   --   --   HGB 8.0* 6.4* 6.7* 8.5* 7.6*  HCT 27.9* 22.6* 23.8* 28.3* 25.7*  MCV 103.3* 105.1* 104.4* 100.0 101.6*  PLT 403* 325 318 321 322    Cardiac Enzymes: Recent Labs  Lab 02/18/18 0756 02/19/18 0900  TROPONINI 0.11* 0.07*    BNP: Invalid input(s): POCBNP  CBG: Recent Labs  Lab 02/20/18 0726 02/20/18 1136 02/20/18 1635 02/20/18 2121 02/21/18 0742  GLUCAP 128* 74 83 85 137*    Microbiology: Results for orders placed or performed during the hospital encounter of 02/18/18  Blood Culture (routine  x 2)     Status: None (Preliminary result)   Collection Time: 02/18/18  7:55 AM  Result Value Ref Range Status   Specimen Description BLOOD RIGHT AC  Final   Special Requests   Final    BOTTLES DRAWN AEROBIC AND ANAEROBIC Blood Culture results may not be optimal due to an excessive volume of blood received in culture bottles   Culture   Final    NO GROWTH 3 DAYS Performed at [facility], [address] [place], [place] [address]    Report Status PENDING  Incomplete  Blood Culture (routine x 2)     Status: None (Preliminary result)   Collection Time: 02/18/18  7:55 AM  Result Value Ref Range Status   Specimen Description BLOOD RIGHT FA  Final   Special Requests   Final    BOTTLES DRAWN AEROBIC AND ANAEROBIC Blood Culture results may not be optimal due to an excessive volume of blood received in culture  bottles   Culture   Final    NO GROWTH 3 DAYS Performed at [facility], [address] [place],  [address]    Report Status PENDING  Incomplete    Coagulation Studies: No results for input(s): LABPROT, INR in the last 72 hours.  Urinalysis: No results for input(s): COLORURINE, LABSPEC, PHURINE, GLUCOSEU, HGBUR, BILIRUBINUR, KETONESUR, PROTEINUR, UROBILINOGEN, NITRITE, LEUKOCYTESUR in the last 72 hours.  Invalid input(s): APPERANCEUR    Imaging: No results found.   Medications:   . ceFEPime (MAXIPIME) IV    . heparin 1,050 Units/hr (02/21/18 0541)  . metronidazole 500 mg (02/21/18 0544)  . vancomycin    . vancomycin     . aspirin EC  81 mg Oral Daily  . atorvastatin  80 mg Oral QHS  . calcitRIOL  0.5 mcg Oral Daily  . carvedilol  3.125 mg Oral BID WC  . Chlorhexidine Gluconate Cloth  6 each Topical Q0600  . docusate sodium  100 mg Oral BID  . famotidine  10 mg Oral Daily  . feeding supplement (NEPRO CARB STEADY)  237 mL Oral BID BM  . folic acid  1 mg Oral Daily  . insulin aspart  0-5 Units Subcutaneous QHS  . insulin aspart  0-9 Units Subcutaneous TID WC  . insulin glargine  4 Units Subcutaneous QHS  . isosorbide mononitrate  30 mg Oral Daily  . midodrine  10 mg Oral Q T,Th,Sa-HD  . mirtazapine  7.5 mg Oral QHS  . multivitamin with minerals  2 tablet Oral Daily  . omega-3 acid ethyl esters  1 capsule Oral Daily  . sevelamer carbonate  800 mg Oral TID WC  . vitamin B-12  1,000 mcg Oral Daily   acetaminophen **OR** acetaminophen, LORazepam, metoCLOPramide (REGLAN) injection, nitroGLYCERIN, ondansetron **OR** ondansetron (ZOFRAN) IV, oxyCODONE  Assessment/ Plan:  [name] is a 67 y.o. african [place] female with end stage renal disease on peritoneal dialysis, hypertension, congestive heart failure, coronary artery disease status post CABG, diabetes mellitus type II insulin dependent, GERD, peripheral vascular disease admitted to [facility] on  01/29/2018 for leg pain.    [facility] [place] TTS hemodialysis RIJ permcath Transitioned from PD to HD this month  1. End Stage Renal Disease with hyperkalemia: transitioned from PD to hemodialysis.  - Seen and examined on hemodialysis. Tolerating treatment well.   2.  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 Angiogram on 10/31 - heparin gtt.  - empiric metronidazole and cefepime.  - Appreciate  vascular input.    3. Anemia of chronic kidney disease: status post PRBC transfusion yesterday.  Hemoglobin 7.6 - EPO 10000 units with hemodialysis treatment.   4. Hypotension:   - Midodrine before dialysis.   5. Secondary Hyperparathyroidism: phosphorus 4.1, Calcium 8.5 - discontinue calcitriol - not currently on binders.   Lavonia Dana, [facility]  11/2/201911:46 AM

## 2018-02-22 LAB — CBC
HCT: 27.6 % — ABNORMAL LOW (ref 36.0–46.0)
Hemoglobin: 8.1 g/dL — ABNORMAL LOW (ref 12.0–15.0)
MCH: 29.9 pg (ref 26.0–34.0)
MCHC: 29.3 g/dL — ABNORMAL LOW (ref 30.0–36.0)
MCV: 101.8 fL — ABNORMAL HIGH (ref 80.0–100.0)
Platelets: 319 10*3/uL (ref 150–400)
RBC: 2.71 MIL/uL — ABNORMAL LOW (ref 3.87–5.11)
RDW: 21.8 % — ABNORMAL HIGH (ref 11.5–15.5)
WBC: 16.4 10*3/uL — ABNORMAL HIGH (ref 4.0–10.5)
nRBC: 0.3 % — ABNORMAL HIGH (ref 0.0–0.2)

## 2018-02-22 LAB — GLUCOSE, CAPILLARY
Glucose-Capillary: 141 mg/dL — ABNORMAL HIGH (ref 70–99)
Glucose-Capillary: 136 mg/dL — ABNORMAL HIGH (ref 70–99)
Glucose-Capillary: 107 mg/dL — ABNORMAL HIGH (ref 70–99)
Glucose-Capillary: 201 mg/dL — ABNORMAL HIGH (ref 70–99)
Glucose-Capillary: 170 mg/dL — ABNORMAL HIGH (ref 70–99)

## 2018-02-22 LAB — HEPARIN LEVEL (UNFRACTIONATED): Heparin Unfractionated: 0.43 IU/mL (ref 0.30–0.70)

## 2018-02-22 MED ORDER — ENSURE ENLIVE PO LIQD
237.0000 mL | Freq: Two times a day (BID) | ORAL | Status: DC
  Administered 2018-02-22 – 2018-02-28 (×8): 237 mL via ORAL

## 2018-02-22 MED ORDER — SODIUM CHLORIDE 0.9 % IV SOLN
INTRAVENOUS | Status: DC | PRN
  Administered 2018-02-22: 250 mL via INTRAVENOUS
  Administered 2018-02-25: 11:00:00 via INTRAVENOUS
  Administered 2018-02-27: 250 mL via INTRAVENOUS

## 2018-02-22 NOTE — Progress Notes (Signed)
[facility]  ROUNDING NOTE   Subjective:   Hemodialysis treatment yesterday. Tolerated treatment well. UF of 1.7 liters  Patient sitting in bed. Has no complaints. Asking to have her PD catheter removed  Objective:  Vital signs in last 24 hours:  Temp:  [98.1 F (36.7 C)-98.7 F (37.1 C)] 98.1 F (36.7 C) (11/03 0525) Pulse Rate:  [71-84] 76 (11/03 0906) Resp:  [16-18] 18 (11/03 0525) BP: (92-177)/(51-99) 150/58 (11/03 0906) SpO2:  [94 %-100 %] 95 % (11/03 0525) Weight:  [64.9 kg] 64.9 kg (11/02 1315)  Weight change:  Filed Weights   02/19/18 2312 02/21/18 0910 02/21/18 1315  Weight: 68.8 kg 66.5 kg 64.9 kg    Intake/Output: I/O last 3 completed shifts: In: 1842.3 [I.V.:958.2; Blood:387; IV Piggyback:497.1] Out: 3244 [Other:1740]   Intake/Output this shift:  Total I/O In: 286.8 [P.O.:240; I.V.:46.8] Out: -   Physical Exam: General: NAD  Head: Normocephalic, atraumatic. Moist oral mucosal membranes  Eyes: Anicteric  Neck: Supple, trachea midline  Lungs:  Clear to auscultation  Heart: Regular rate and rhythm  Abdomen:  Soft, nontender  Extremities: Bilateral lower extremity gangrene, left foot wound  Neurologic: Nonfocal, moving all four extremities  Skin: No lesions  Access: PD catheter, left IJ tunneled catheter    Basic Metabolic Panel: Recent Labs  Lab 02/18/18 0756 02/20/18 0340 02/21/18 0927  NA 139 137 139  K 4.4 4.2 4.7  CL 97* 100 100  CO2 29 27 27   GLUCOSE 87 156* 123*  BUN 27* 16 26*  CREATININE 5.23* 3.51* 5.56*  CALCIUM 8.7* 7.8* 8.5*  PHOS  --  2.2* 4.1    Liver Function Tests: Recent Labs  Lab 02/18/18 0756 02/20/18 0340 02/21/18 0927  AST 22  --   --   ALT <5  --   --   ALKPHOS 154*  --   --   BILITOT 0.4  --   --   PROT 7.4  --   --   ALBUMIN 2.0* 1.7* 1.8*   No results for input(s): LIPASE, AMYLASE in the last 168 hours. No results for input(s): AMMONIA in the last 168 hours.  CBC: Recent Labs  Lab  02/18/18 0756 02/19/18 0500 02/20/18 0340 02/21/18 0443 02/21/18 0927 02/22/18 0620  WBC 16.6* 13.2* 13.1* 15.4* 16.4* 16.4*  NEUTROABS 13.4*  --   --   --   --   --   HGB 8.0* 6.4* 6.7* 8.5* 7.6* 8.1*  HCT 27.9* 22.6* 23.8* 28.3* 25.7* 27.6*  MCV 103.3* 105.1* 104.4* 100.0 101.6* 101.8*  PLT 403* 325 318 321 322 319    Cardiac Enzymes: Recent Labs  Lab 02/18/18 0756 02/19/18 0900  TROPONINI 0.11* 0.07*    BNP: Invalid input(s): POCBNP  CBG: Recent Labs  Lab 02/21/18 0742 02/21/18 1743 02/21/18 2127 02/22/18 0726 02/22/18 0842  GLUCAP 137* 115* 150* 141* 136*    Microbiology: Results for orders placed or performed during the hospital encounter of 02/18/18  Blood Culture (routine x 2)     Status: None (Preliminary result)   Collection Time: 02/18/18  7:55 AM  Result Value Ref Range Status   Specimen Description BLOOD RIGHT AC  Final   Special Requests   Final    BOTTLES DRAWN AEROBIC AND ANAEROBIC Blood Culture results may not be optimal due to an excessive volume of blood received in culture bottles   Culture   Final    NO GROWTH 4 DAYS Performed at [facility], [address] [place], [place] [address]    Report Status PENDING  Incomplete  Blood Culture (routine x 2)     Status: None (Preliminary result)   Collection Time: 02/18/18  7:55 AM  Result Value Ref Range Status   Specimen Description BLOOD RIGHT FA  Final   Special Requests   Final    BOTTLES DRAWN AEROBIC AND ANAEROBIC Blood Culture results may not be optimal due to an excessive volume of blood received in culture bottles   Culture   Final    NO GROWTH 4 DAYS Performed at [facility], [address], [place], [place] [address]    Report Status PENDING  Incomplete    Coagulation Studies: No results for input(s): LABPROT, INR in the last 72 hours.  Urinalysis: No results for input(s): COLORURINE, LABSPEC, PHURINE, GLUCOSEU, HGBUR, BILIRUBINUR, KETONESUR, PROTEINUR,  UROBILINOGEN, NITRITE, LEUKOCYTESUR in the last 72 hours.  Invalid input(s): APPERANCEUR    Imaging: No results found.   Medications:   . ceFEPime (MAXIPIME) IV Stopped (02/21/18 1904)  . heparin 1,050 Units/hr (02/22/18 0909)  . vancomycin Stopped (02/21/18 1400)   . aspirin EC  81 mg Oral Daily  . atorvastatin  80 mg Oral QHS  . carvedilol  3.125 mg Oral BID WC  . Chlorhexidine Gluconate Cloth  6 each Topical Q0600  . docusate sodium  100 mg Oral BID  . epoetin (EPOGEN/PROCRIT) injection  10,000 Units Intravenous Q T,Th,Sa-HD  . famotidine  10 mg Oral Daily  . feeding supplement (ENSURE ENLIVE)  237 mL Oral BID BM  . folic acid  1 mg Oral Daily  . insulin aspart  0-5 Units Subcutaneous QHS  . insulin aspart  0-9 Units Subcutaneous TID WC  . insulin glargine  4 Units Subcutaneous QHS  . isosorbide mononitrate  30 mg Oral Daily  . midodrine  10 mg Oral Q T,Th,Sa-HD  . mirtazapine  7.5 mg Oral QHS  . multivitamin with minerals  2 tablet Oral Daily  . omega-3 acid ethyl esters  1 capsule Oral Daily  . sevelamer carbonate  800 mg Oral TID WC  . vitamin B-12  1,000 mcg Oral Daily   acetaminophen **OR** acetaminophen, metoCLOPramide (REGLAN) injection, nitroGLYCERIN, ondansetron **OR** ondansetron (ZOFRAN) IV, oxyCODONE  Assessment/ Plan:  Ms. Cortnee Steinmiller is a 67 y.o. african [place] female with end stage renal disease on peritoneal dialysis, hypertension, congestive heart failure, coronary artery disease status post CABG, diabetes mellitus type II insulin dependent, GERD, peripheral vascular disease admitted to [facility] on 01/29/2018 for leg pain.    [facility] [place] TTS hemodialysis RIJ permcath Transitioned from PD to HD this month  1. End Stage Renal Disease with hyperkalemia: transitioned from PD to hemodialysis.  - Asking to have PD catheter removed.  - Hemodialysis TTS schedule.   2.  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  Angiogram on 10/31 Endocarditis (end date was 11/22) - heparin gtt.  - empiric metronidazole, vancomycin and cefepime.  - Appreciate podiatry, vascular and ID input.    3. Anemia of chronic kidney disease: status post PRBC transfusions during this admission - EPO 10000 units with hemodialysis treatment.   4. Hypotension:   - Midodrine before dialysis.   5. Secondary Hyperparathyroidism:  - not currently on binders.   Lavonia Dana, [facility]  11/3/201910:47 AM

## 2018-02-22 NOTE — Progress Notes (Addendum)
[facility] [place] NAME: Stephanie Ellis    MR#:  [id number]  DATE OF BIRTH:  28-May-1950  SUBJECTIVE:   Eating BF. Nausea improved. Angiogram went well.   Appears cheerful IV heparin gtt REVIEW OF SYSTEMS:  Review of Systems  Constitutional: Negative for chills and fever.  HENT: Negative for congestion and sore throat.   Eyes: Negative for blurred vision and double vision.  Respiratory: Negative for cough and shortness of breath.   Cardiovascular: Negative for chest pain, palpitations and leg swelling.  Gastrointestinal: Negative for abdominal pain, nausea and vomiting.  Genitourinary: Negative for dysuria and frequency.  Musculoskeletal: Negative for back pain and neck pain.  Neurological: Negative for dizziness and headaches.  Psychiatric/Behavioral: Negative for depression. The patient is not nervous/anxious.     DRUG ALLERGIES:   Allergies  Allergen Reactions  . Chlorthalidone Anaphylaxis, Itching and Rash  . Fentanyl Rash  . Midazolam Rash  . Ace Inhibitors Other (See Comments)    Reaction:  Hyperkalemia, agitation   . Angiotensin Receptor Blockers Other (See Comments)    Reaction:  Hyperkalemia, agitation   . Norvasc [Amlodipine] Itching and Rash  . Phenergan [Promethazine Hcl] Anxiety    "antsy, can't sit still"   VITALS:  Blood pressure (!) 150/58, pulse 76, temperature 98.1 F (36.7 C), temperature source Oral, resp. rate 18, height 5' 4"  (1.626 m), weight 64.9 kg, SpO2 95 %. PHYSICAL EXAMINATION:  Physical Exam  GENERAL:  67 y.o.-year-old patient lying in the bed with no acute distress.  EYES: Pupils equal, round, reactive to light and accommodation. No scleral icterus. Extraocular muscles intact.  HEENT: Head atraumatic, normocephalic. Oropharynx and nasopharynx clear.  NECK:  Supple, no jugular venous distention. No thyroid enlargement, no tenderness.  LUNGS: Normal breath sounds bilaterally, no wheezing,  rales,rhonchi or crepitation. No use of accessory muscles of respiration.  CARDIOVASCULAR: RRR, S1, S2 normal. No murmurs, rubs, or gallops.  ABDOMEN: Soft, nontender, nondistended. Bowel sounds present. No organomegaly or mass.  EXTREMITIES: +weak pulse on right foot, right foot with necrosis of all 5 toes extending up the plantar surface. +left great toe amputation with some dorsal necrosis present. No foul odor NEUROLOGIC: Cranial nerves II through XII are intact. gen weakness Sensation intact. Gait not checked.  PSYCHIATRIC: patient is alert and oriented x 3.  SKIN: see above LABORATORY PANEL:  Female CBC Recent Labs  Lab 02/22/18 0620  WBC 16.4*  HGB 8.1*  HCT 27.6*  PLT 319   ------------------------------------------------------------------------------------------------------------------ Chemistries  Recent Labs  Lab 02/18/18 0756  02/21/18 0927  NA 139   < > 139  K 4.4   < > 4.7  CL 97*   < > 100  CO2 29   < > 27  GLUCOSE 87   < > 123*  BUN 27*   < > 26*  CREATININE 5.23*   < > 5.56*  CALCIUM 8.7*   < > 8.5*  AST 22  --   --   ALT <5  --   --   ALKPHOS 154*  --   --   BILITOT 0.4  --   --    < > = values in this interval not displayed.   RADIOLOGY:  No results found. ASSESSMENT AND PLAN:  Stephanie Ellis  is a 67 y.o. 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  She was sent out to  rehab on Eliquis.  She was brought back in with ischemia of the right foot and toes and also ischemia on the left foot and toe.  *Bilateral lower extremity gangrene- s/p angiogram of RLE this morning with stents placed, also s/p left great toe amputation at last hospitalization. -Vascular and podiatry following -Anticipate that patient will likely need bilateral amputations -on IV  vancomycin and cefepime and flagyl -Continue heparin drip  *Recent history of endocarditis- s/p septic embolization  treated with revascularization -Continue antibiotics as above  *ESRD on HD TTS -Nephrology consulted  *T2DM- blood sugars well-controlled -Continue lantus and SSI  * Elevated troponin with history of CAD s/p CABG- stable, no active chest pain. Troponin likely elevated due to reduced renal clearance. -Continue aspirin -On heparin drip  * Chronic diastolic CHF- no signs of volume overload -fluid management per nephro  *Staph aureus bacteremia. Has tricuspid endocarditis The hemodialysis catheter has been removed . Will be getting  IV Cefazolin until 03/13/18 ( currently on vanco/cefepime instead)   All the records are reviewed and case discussed with Care Management/Social Worker. Management plans discussed with the patient, family and they are in agreement.  CODE STATUS: Full Code  TOTAL TIME TAKING CARE OF THIS PATIENT: 40 minutes.   More than 50% of the time was spent in counseling/coordination of care: YES  POSSIBLE D/C in ?? DAYS, DEPENDING ON CLINICAL CONDITION.   Fritzi Mandes M.D on 02/22/2018 at 9:22 AM  Between 7am to 6pm - Pager - [phone]  After 6pm go to [email or web address] - Proofreader  [facility]  Office  [phone]  CC: Primary care physician; System, Pcp Not In  Note: This dictation was prepared with Dragon dictation along with smaller phrase technology. Any transcriptional errors that result from this process are unintentional.

## 2018-02-22 NOTE — Progress Notes (Signed)
Subjective: Interval History: has no complaint of foot pain, foul odor, or drainage.  Is interested in having her peritoneal dialysis catheter removed  Objective: Vital signs in last 24 hours: Temp:  [98 F (36.7 C)-98.7 F (37.1 C)] 98.1 F (36.7 C) (11/03 0525) Pulse Rate:  [62-99] 79 (11/03 0525) Resp:  [13-18] 18 (11/03 0525) BP: (92-177)/(43-125) 145/64 (11/03 0525) SpO2:  [94 %-100 %] 95 % (11/03 0525) Weight:  [64.9 kg-66.5 kg] 64.9 kg (11/02 1315)  Intake/Output from previous day: 11/02 0701 - 11/03 0700 In: 1016.6 [I.V.:716.6; IV Piggyback:300.1] Out: 1740  Intake/Output this shift: No intake/output data recorded.  General appearance: alert, cooperative and no distress Head: Normocephalic, without obvious abnormality, atraumatic Neck: no adenopathy, no JVD and supple, symmetrical, trachea midline GI: soft, non-tender; bowel sounds normal; no masses,  no organomegaly and PD catheter in place, no evidence of active infection Extremities: Dry gangrenous changes as outlined without change from yesterday Pulses: Absent likely secondary to severe medial calcification but warm feet, with extensive prior tissue damage Skin: Skin color, texture, turgor normal. No rashes or lesions with exception of feet Neurologic: Alert and oriented X 3, normal strength and tone. Poor sensation distally Incision/Wound: No insertion site complications left femoral access  Lab Results: Recent Labs    02/21/18 0927 02/22/18 0620  WBC 16.4* 16.4*  HGB 7.6* 8.1*  HCT 25.7* 27.6*  PLT 322 319   BMET Recent Labs    02/20/18 0340 02/21/18 0927  NA 137 139  K 4.2 4.7  CL 100 100  CO2 27 27  GLUCOSE 156* 123*  BUN 16 26*  CREATININE 3.51* 5.56*  CALCIUM 7.8* 8.5*    Studies/Results: Dg Tibia/fibula Left  Result Date: 01/29/2018 CLINICAL DATA:  Presents with ED via ems from home Having pain to both lower legs But states left leg is worse Increased pain with palpation Decreased  pulses to foot EXAM: LEFT TIBIA AND FIBULA - 2 VIEW COMPARISON:  None. FINDINGS: No fracture.  No bone lesion. Knee and ankle joints are normally aligned. There are arterial vascular calcifications. Soft tissues are otherwise unremarkable. IMPRESSION: Negative Electronically Signed   By: Lajean Manes M.D.   On: 01/29/2018 20:58   Ct Chest W Contrast  Result Date: 02/12/2018 CLINICAL DATA:  Recent placement of PermCath with an episode of hemoptysis. Patient also presenting with lethargy and signs of sepsis. EXAM: CT CHEST WITH CONTRAST TECHNIQUE: Multidetector CT imaging of the chest was performed during intravenous contrast administration. CONTRAST:  42m OMNIPAQUE IOHEXOL 300 MG/ML  SOLN COMPARISON:  Chest radiograph 02/11/2018 FINDINGS: Cardiovascular: Dual lumen central venous catheter from left IJ approach terminates in the right atrium. There is a small amount of subcutaneous emphysema adjacent to the skin entrance site. No perivascular hematoma is seen along the catheter course. Enlarged cardiac silhouette. Heavy calcific atherosclerotic disease of the coronary arteries and aorta. Post CABG postsurgical changes. Mediastinum/Nodes: No evidence of lymphadenopathy. 12 mm circumscribed hypoattenuated left thyroid nodule. Lungs/Pleura: No evidence of pneumothorax or pleural effusion. Linear airspace opacities in bilateral lower lungs may represent atelectasis versus scarring. Punctate calcifications are noted along bilateral pleural surfaces. Upper Abdomen: Scattered calcifications throughout the spleen. Punctate vascular calcifications versus small nonobstructive calculi in bilateral kidneys. Musculoskeletal: No chest wall abnormality. No acute or significant osseous findings. IMPRESSION: Dual lumen left IJ approach central venous catheter terminates in the right atrium. Small amount of subcutaneous emphysema adjacent to the skin entrance site, expected postprocedural finding. No evidence of perivascular  hematoma. No  evidence of pneumothorax. Bilateral lower lobes pulmonary scarring versus atelectasis. Scattered calcifications along the pleural surfaces in bilateral lower lobes may represent evidence of prior granulomatous disease or asbestos exposure. Heavy calcific atherosclerotic disease of the aorta and coronary arteries. Postsurgical changes of CABG. Cardiomegaly. 12 mm left thyroid nodule. If further imaging is desired, dedicated thyroid ultrasound may be considered. Aortic Atherosclerosis (ICD10-I70.0). Electronically Signed   By: Fidela Salisbury M.D.   On: 02/12/2018 11:13   US Venous Img Lower Unilateral Left  Result Date: 01/29/2018 CLINICAL DATA:  Left leg pain EXAM: LEFT LOWER EXTREMITY VENOUS DOPPLER ULTRASOUND TECHNIQUE: Gray-scale sonography with graded compression, as well as color Doppler and duplex ultrasound were performed to evaluate the lower extremity deep venous systems from the level of the common femoral vein and including the common femoral, femoral, profunda femoral, popliteal and calf veins including the posterior tibial, peroneal and gastrocnemius veins when visible. The superficial great saphenous vein was also interrogated. Spectral Doppler was utilized to evaluate flow at rest and with distal augmentation maneuvers in the common femoral, femoral and popliteal veins. COMPARISON:  None. FINDINGS: Contralateral Common Femoral Vein: Respiratory phasicity is normal and symmetric with the symptomatic side. No evidence of thrombus. Normal compressibility. Common Femoral Vein: No evidence of thrombus. Normal compressibility, respiratory phasicity and response to augmentation. Saphenofemoral Junction: No evidence of thrombus. Normal compressibility and flow on color Doppler imaging. Profunda Femoral Vein: No evidence of thrombus. Normal compressibility and flow on color Doppler imaging. Femoral Vein: No evidence of thrombus. Normal compressibility, respiratory phasicity and response  to augmentation. Popliteal Vein: No evidence of thrombus. Normal compressibility, respiratory phasicity and response to augmentation. Calf Veins: No evidence of thrombus. Normal compressibility and flow on color Doppler imaging. Superficial Great Saphenous Vein: No evidence of thrombus. Normal compressibility. Venous Reflux:  None. Other Findings:  None. IMPRESSION: No evidence of deep venous thrombosis. Electronically Signed   By: Rolm Baptise M.D.   On: 01/29/2018 18:36   Dg Chest Port 1 View  Result Date: 02/11/2018 CLINICAL DATA:  Hemoptysis EXAM: PORTABLE CHEST 1 VIEW COMPARISON:  10/15/2017 FINDINGS: Left IJ dialysis catheter tips proximal mid right atrium. Stable cardiomegaly without CHF. Chronic left basilar scarring as before with blunting of left costophrenic angle. No definite superimposed acute process or CHF. No pneumothorax. Trachea is midline. Previous median sternotomy noted. Aorta atherosclerotic. Degenerative changes noted of the spine. IMPRESSION: Stable cardiomegaly and left basilar scarring. No interval change or superimposed acute process. Electronically Signed   By: Jerilynn Mages.  Shick M.D.   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 Diabetes and end-stage renal disease. EXAM: RIGHT FOOT COMPLETE - 3+ VIEW COMPARISON:  None. FINDINGS: The bones appear demineralized. There is no evidence of acute fracture, dislocation or bone destruction. There is an old healed fracture of the distal 5th metatarsal shaft. There is moderate joint space narrowing, osteophyte formation and mild hallux valgus deformity of the 1st MTP joint. There are lobulated calcifications within the 1st web space which could reflect hydroxyapatite deposition. Prominent vascular calcifications are noted. There is an additional lobular calcification in the plantar aspect of the hindfoot adjacent to the plantar  artery which could reflect a small aneurysm. IMPRESSION: 1. No acute osseous findings or radiographic evidence of osteomyelitis. 2. 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 Additional calcification in the 1st web space could be vascular or hydroxyapatite deposition.  Electronically Signed   By: Richardean Sale M.D.   On: 02/18/2018 09:33   Dg Toe Great Left  Result Date: 01/29/2018 CLINICAL DATA:  Open wound to the left great toe for several months. EXAM: LEFT GREAT TOE COMPARISON:  10/07/2017 FINDINGS: Degenerative changes in the first metatarsal-phalangeal joint. Soft tissue defect over the plantar aspect of the left first toe. There is underlying erosion of the distal phalangeal tuft cortex suggesting focal osteomyelitis. No evidence of acute fracture or dislocation. Diffuse bone demineralization. Prominent vascular calcifications. IMPRESSION: Soft tissue defect over the plantar aspect of the left first toe with underlying erosion of the distal phalangeal tuft suggesting osteomyelitis. Electronically Signed   By: Lucienne Capers M.D.   On: 01/29/2018 21:30   Anti-infectives: Anti-infectives (From admission, onward)   Start     Dose/Rate Route Frequency Ordered Stop   02/21/18 1800  ceFEPIme (MAXIPIME) 1 g in sodium chloride 0.9 % 100 mL IVPB     1 g 200 mL/hr over 30 Minutes Intravenous Every 24 hours 02/21/18 1044     02/21/18 1200  vancomycin (VANCOCIN) IVPB 750 mg/150 ml premix     750 mg 150 mL/hr over 60 Minutes Intravenous Every T-Th-Sa (Hemodialysis) 02/20/18 1244     02/20/18 1800  ceFEPIme (MAXIPIME) 500 mg in dextrose 5 % 50 mL IVPB  Status:  Discontinued     500 mg 100 mL/hr over 30 Minutes Intravenous Every 24 hours 02/20/18 1250 02/21/18 1044   02/20/18 1600  vancomycin (VANCOCIN) IVPB 750 mg/150 ml premix     750 mg 150 mL/hr over 60 Minutes Intravenous  Once 02/20/18 1258     02/19/18 2200  metroNIDAZOLE (FLAGYL) IVPB 500 mg  Status:  Discontinued     500 mg 100 mL/hr  over 60 Minutes Intravenous Every 8 hours 02/19/18 1639 02/21/18 1502   02/19/18 1645  metroNIDAZOLE (FLAGYL) IVPB 500 mg  Status:  Discontinued     500 mg 100 mL/hr over 60 Minutes Intravenous Every 8 hours 02/19/18 1636 02/19/18 1639   02/19/18 1200  vancomycin (VANCOCIN) IVPB 500 mg/100 ml premix  Status:  Discontinued     500 mg 100 mL/hr over 60 Minutes Intravenous Every T-Th-Sa (Hemodialysis) 02/18/18 1338 02/18/18 1536   02/19/18 1200  ceFEPIme (MAXIPIME) 1 g in sodium chloride 0.9 % 100 mL IVPB  Status:  Discontinued     1 g 200 mL/hr over 30 Minutes Intravenous Every 24 hours 02/18/18 1338 02/20/18 1250   02/19/18 1200  vancomycin (VANCOCIN) 500 mg in sodium chloride 0.9 % 500 mL IVPB  Status:  Discontinued     500 mg 250 mL/hr over 120 Minutes Intravenous Every T-Th-Sa (Hemodialysis) 02/18/18 1537 02/18/18 1547   02/19/18 1200  vancomycin (VANCOCIN) 500 mg in sodium chloride 0.9 % 100 mL IVPB  Status:  Discontinued     500 mg 100 mL/hr over 60 Minutes Intravenous Every T-Th-Sa (Hemodialysis) 02/18/18 1547 02/20/18 1244   02/18/18 2211  ceFAZolin (ANCEF) IVPB 2g/100 mL premix     2 g 200 mL/hr over 30 Minutes Intravenous 30 min pre-op 02/18/18 2211 02/19/18 1137   02/18/18 1330  vancomycin (VANCOCIN) 500 mg in sodium chloride 0.9 % 100 mL IVPB     500 mg 100 mL/hr over 60 Minutes Intravenous  Once 02/18/18 1320 02/18/18 1554   02/18/18 0800  ceFEPIme (MAXIPIME) 2 g in sodium chloride 0.9 % 100 mL IVPB     2 g 200 mL/hr over 30 Minutes Intravenous  Once  02/18/18 0748 02/18/18 0847   02/18/18 0800  metroNIDAZOLE (FLAGYL) IVPB 500 mg  Status:  Discontinued     500 mg 100 mL/hr over 60 Minutes Intravenous Every 8 hours 02/18/18 0748 02/19/18 1516   02/18/18 0800  vancomycin (VANCOCIN) IVPB 1000 mg/200 mL premix     1,000 mg 200 mL/hr over 60 Minutes Intravenous  Once 02/18/18 0748 02/18/18 1003      Assessment/Plan: s/p Procedure(s): Lower Extremity Angiography (Right) Await  ongoing demarcation.  No evidence of wet gangrene today.  No expressible crepitance or drainage.  Most likely a candidate for right below-knee amputation given the extensive soft tissue loss present without preservation of weightbearing surface.  Timing will be per Dr. Lucky Cowboy this week  She also is expressing a desire at this point for removal of her peritoneal dialysis catheter as this is not being used, is receiving hemodialysis through a functional left arm access   LOS: 4 days   Tariya Morrissette A Antionetta Ator 02/22/2018, 8:04 AM

## 2018-02-22 NOTE — Progress Notes (Signed)
Verbal order from MD to place NPO order at midnight incase pt has surgery tomorrow.   Lucy Woolever [employer]

## 2018-02-22 NOTE — Progress Notes (Signed)
ANTICOAGULATION CONSULT NOTE - Initial Consult  Pharmacy Consult for Heparin Drip Indication: VTE treatment  Patient Measurements: Height: 5' 4"  (162.6 cm) Weight: 143 lb 1.3 oz (64.9 kg) IBW/kg (Calculated) : 54.7  Vital Signs: Temp: 98.1 F (36.7 C) (11/03 0525) Temp Source: Oral (11/03 0525) BP: 145/64 (11/03 0525) Pulse Rate: 79 (11/03 0525)  Labs: Recent Labs    02/19/18 0900  02/19/18 1700  02/20/18 0340 02/21/18 0443 02/21/18 0927 02/22/18 0620  HGB  --   --   --    < > 6.7* 8.5* 7.6* 8.1*  HCT  --   --   --    < > 23.8* 28.3* 25.7* 27.6*  PLT  --   --   --    < > 318 321 322 319  APTT  --   --  136*  --   --   --   --   --   HEPARINUNFRC  --    < > 0.39  --  0.38 0.45  --  0.43  CREATININE  --   --   --   --  3.51*  --  5.56*  --   TROPONINI 0.07*  --   --   --   --   --   --   --    < > = values in this interval not displayed.    Medical History: Past Medical History:  Diagnosis Date  . (HFpEF) heart failure with preserved ejection fraction ([place])    a. 01/2018 Echo: EF 50-55%, no rwma, Gr1 DD, triv AI, mild MR. Midly dil LA/RV, mod reduced RV fxn. Irreg thickening of TV w/ mobile echodensity that appears to arise from valve.  . Anemia   . Anorexia   . Bacteremia due to Pseudomonas   . Carotid arterial disease ([place])    a. 01/2017 Carotid U/S: RICA <09, LICA <38.  Marland Kitchen Complication of anesthesia    a. Pt reports h/o complication on 9 different occasions - ? hypotension/arrest.  . 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 01/2015 Cath [facility]): 70% LM, p-mLAD 50-60% (Resting FFR 0.75), mRCA 80-90%, ~40 Ost OM & D1-->CABG; b. 01/2016 Staged PCI of LCX x 2 and RCA.  Marland Kitchen ESRD (end stage renal disease) on dialysis ([place])    a. ESRD secondary to acute kidney failure s/p CABG-->PD  . Essential hypertension   . GERD (gastroesophageal reflux disease)   . Heart murmur   . Hypercholesterolemia   . Myocardial infarction ([place]) 2016  . PVD  (peripheral vascular disease) ([place])    a. 01/30/2018 PV Angio: Sev Left Popliteal dzs s/p PTA w/ 104m lutonix DEB w/ mech aspiration of the L popliteal, L AT, and Left tibioperoneal trunck and peroneal artery.  . 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  [facility]: Dr. BMarland KitchenHaithcock: CABG x 3, LIMA to LAD, SVG to RCA, SVG to OM3, EVH  . Sinusitis 2019  . Type II diabetes mellitus with complication (HCC)    CAD    Medications:  Scheduled:  . aspirin EC  81 mg Oral Daily  . atorvastatin  80 mg Oral QHS  . carvedilol  3.125 mg Oral BID WC  . Chlorhexidine Gluconate Cloth  6 each Topical Q0600  . docusate sodium  100 mg Oral BID  . epoetin (EPOGEN/PROCRIT) injection  10,000 Units Intravenous Q T,Th,Sa-HD  . famotidine  10 mg Oral Daily  . feeding supplement (NEPRO CARB STEADY)  237  mL Oral BID BM  . folic acid  1 mg Oral Daily  . insulin aspart  0-5 Units Subcutaneous QHS  . insulin aspart  0-9 Units Subcutaneous TID WC  . insulin glargine  4 Units Subcutaneous QHS  . isosorbide mononitrate  30 mg Oral Daily  . midodrine  10 mg Oral Q T,Th,Sa-HD  . mirtazapine  7.5 mg Oral QHS  . multivitamin with minerals  2 tablet Oral Daily  . omega-3 acid ethyl esters  1 capsule Oral Daily  . sevelamer carbonate  800 mg Oral TID WC  . vitamin B-12  1,000 mcg Oral Daily   Infusions:  . ceFEPime (MAXIPIME) IV Stopped (02/21/18 1904)  . heparin 1,050 Units/hr (02/22/18 0447)  . vancomycin Stopped (02/21/18 1400)  . vancomycin      Assessment: Pharmacy consulted to dose and monitor heparin drip for this 67 yo female presenting to ED with limb threatening ischemia bilateral legs. Baseline troponin 0.11.  Vascular surgery procedure 10/31. She was on apixaban PTA. Baseline aPTT 45s, no baseline heparin level drawn.  She refuses all lab draws except for those done during HD sessions. This morning she was taken by vascular for an angioplasty and the infusion ran during the entire procedure. She is scheduled for HD  this evening, therefore, aPTT and HL will be ordered with this session's labs.  Heparin course:  Initiation: 4000 unit bolus, then 1050 units/hr 10/30 1828 aPTT 91s, HL 0.71 10/31 APTT 136 HL 0.39   Goal of Therapy:  Heparin level 0.3-0.7 units/ml Monitor platelets by anticoagulation protocol: Yes   Plan:  Without a baseline HL or known last dose of apixaban, cannot be 100% sure if effect of apixaban is still being seen. However the two levels last night were very close to correlating and HL is no longer elevated. HL is typically more accurate. Therefore, I will leave heparin at same rate and attempt to check a level in the AM. If patient is still refusing labs and she has no other procedures planned, then she needs to be changed back to apixaban.  11/1 AM heparin level 0.38. Continue current regimen. Recheck heparin level and CBC with tomorrow AM labs.  11/02 AM heparin level 0.45. Continue current regimen. Recheck heparin level and CBC with tomorrow AM labs.  11/03 AM heparin level 0.43. Continue current regimen. Recheck heparin level and CBC with tomorrow AM labs.  Arlyn Buerkle S, Pharm.D. Clinical Pharmacist 02/22/2018,6:58 AM

## 2018-02-23 DIAGNOSIS — I70263 Atherosclerosis of native arteries of extremities with gangrene, bilateral legs: Secondary | ICD-10-CM

## 2018-02-23 DIAGNOSIS — N186 End stage renal disease: Secondary | ICD-10-CM

## 2018-02-23 DIAGNOSIS — Z992 Dependence on renal dialysis: Secondary | ICD-10-CM

## 2018-02-23 DIAGNOSIS — I998 Other disorder of circulatory system: Secondary | ICD-10-CM

## 2018-02-23 LAB — CBC
HCT: 27.3 % — ABNORMAL LOW (ref 36.0–46.0)
Hemoglobin: 8 g/dL — ABNORMAL LOW (ref 12.0–15.0)
MCH: 30.3 pg (ref 26.0–34.0)
MCHC: 29.3 g/dL — ABNORMAL LOW (ref 30.0–36.0)
MCV: 103.4 fL — ABNORMAL HIGH (ref 80.0–100.0)
Platelets: 365 10*3/uL (ref 150–400)
RBC: 2.64 MIL/uL — ABNORMAL LOW (ref 3.87–5.11)
RDW: 20.8 % — ABNORMAL HIGH (ref 11.5–15.5)
WBC: 16.6 10*3/uL — ABNORMAL HIGH (ref 4.0–10.5)
nRBC: 0.4 % — ABNORMAL HIGH (ref 0.0–0.2)

## 2018-02-23 LAB — CULTURE, BLOOD (ROUTINE X 2)
Culture: NO GROWTH
Culture: NO GROWTH

## 2018-02-23 LAB — GLUCOSE, CAPILLARY
Glucose-Capillary: 153 mg/dL — ABNORMAL HIGH (ref 70–99)
Glucose-Capillary: 147 mg/dL — ABNORMAL HIGH (ref 70–99)
Glucose-Capillary: 155 mg/dL — ABNORMAL HIGH (ref 70–99)
Glucose-Capillary: 121 mg/dL — ABNORMAL HIGH (ref 70–99)

## 2018-02-23 LAB — HEPARIN LEVEL (UNFRACTIONATED)
Heparin Unfractionated: 0.25 IU/mL — ABNORMAL LOW (ref 0.30–0.70)
Heparin Unfractionated: 0.37 IU/mL (ref 0.30–0.70)

## 2018-02-23 MED ORDER — HEPARIN BOLUS VIA INFUSION
800.0000 [IU] | Freq: Once | INTRAVENOUS | Status: AC
  Administered 2018-02-23: 800 [IU] via INTRAVENOUS
  Filled 2018-02-23: qty 800

## 2018-02-23 MED ORDER — B COMPLEX-C PO TABS
1.0000 | ORAL_TABLET | Freq: Every day | ORAL | Status: DC
  Administered 2018-02-23 – 2018-02-27 (×5): 1 via ORAL
  Filled 2018-02-23 (×6): qty 1

## 2018-02-23 MED ORDER — OCUVITE-LUTEIN PO CAPS
1.0000 | ORAL_CAPSULE | Freq: Every day | ORAL | Status: DC
  Administered 2018-02-23 – 2018-02-27 (×3): 1 via ORAL
  Filled 2018-02-23 (×6): qty 1

## 2018-02-23 MED ORDER — ADULT MULTIVITAMIN W/MINERALS CH
1.0000 | ORAL_TABLET | Freq: Every day | ORAL | Status: DC
  Administered 2018-02-23 – 2018-02-27 (×4): 1 via ORAL
  Filled 2018-02-23 (×4): qty 1

## 2018-02-23 NOTE — Progress Notes (Signed)
[facility]  ROUNDING NOTE   Subjective:   Patient is doing fair No acute complaints States that pain is controlled Trying to eat more as much as she can  Objective:  Vital signs in last 24 hours:  Temp:  [97.4 F (36.3 C)-98.3 F (36.8 C)] 97.4 F (36.3 C) (11/04 1120) Pulse Rate:  [66-72] 66 (11/04 1120) Resp:  [16-18] 18 (11/04 0715) BP: (121-162)/(49-76) 142/67 (11/04 1120) SpO2:  [93 %-96 %] 96 % (11/04 1120)  Weight change:  Filed Weights   02/19/18 2312 02/21/18 0910 02/21/18 1315  Weight: 68.8 kg 66.5 kg 64.9 kg    Intake/Output: I/O last 3 completed shifts: In: 1546.6 [P.O.:480; I.V.:942.1; IV Piggyback:124.6] Out: 0    Intake/Output this shift:  Total I/O In: 557.1 [P.O.:480; I.V.:77.1] Out: -   Physical Exam: General: NAD  Head: Normocephalic, atraumatic. Moist oral mucosal membranes  Eyes: Anicteric  Neck: Supple, trachea midline  Lungs:  Clear to auscultation  Heart: Regular rate and rhythm  Abdomen:  Soft, nontender  Extremities: Bilateral lower extremity gangrene, left foot wound  Neurologic: Nonfocal, moving all four extremities  Skin: No lesions  Access: PD catheter, left IJ tunneled catheter    Basic Metabolic Panel: Recent Labs  Lab 02/18/18 0756 02/20/18 0340 02/21/18 0927  NA 139 137 139  K 4.4 4.2 4.7  CL 97* 100 100  CO2 29 27 27   GLUCOSE 87 156* 123*  BUN 27* 16 26*  CREATININE 5.23* 3.51* 5.56*  CALCIUM 8.7* 7.8* 8.5*  PHOS  --  2.2* 4.1    Liver Function Tests: Recent Labs  Lab 02/18/18 0756 02/20/18 0340 02/21/18 0927  AST 22  --   --   ALT <5  --   --   ALKPHOS 154*  --   --   BILITOT 0.4  --   --   PROT 7.4  --   --   ALBUMIN 2.0* 1.7* 1.8*   No results for input(s): LIPASE, AMYLASE in the last 168 hours. No results for input(s): AMMONIA in the last 168 hours.  CBC: Recent Labs  Lab 02/18/18 0756  02/20/18 0340 02/21/18 0443 02/21/18 0927 02/22/18 0620 02/23/18 0615  WBC 16.6*   < >  13.1* 15.4* 16.4* 16.4* 16.6*  NEUTROABS 13.4*  --   --   --   --   --   --   HGB 8.0*   < > 6.7* 8.5* 7.6* 8.1* 8.0*  HCT 27.9*   < > 23.8* 28.3* 25.7* 27.6* 27.3*  MCV 103.3*   < > 104.4* 100.0 101.6* 101.8* 103.4*  PLT 403*   < > 318 321 322 319 365   < > = values in this interval not displayed.    Cardiac Enzymes: Recent Labs  Lab 02/18/18 0756 02/19/18 0900  TROPONINI 0.11* 0.07*    BNP: Invalid input(s): POCBNP  CBG: Recent Labs  Lab 02/22/18 1137 02/22/18 1640 02/22/18 2224 02/23/18 0731 02/23/18 1121  GLUCAP 107* 201* 170* 153* 147*    Microbiology: Results for orders placed or performed during the hospital encounter of 02/18/18  Blood Culture (routine x 2)     Status: None   Collection Time: 02/18/18  7:55 AM  Result Value Ref Range Status   Specimen Description BLOOD RIGHT [facility]  Final   Special Requests   Final    BOTTLES DRAWN AEROBIC AND ANAEROBIC Blood Culture results may not be optimal due to an excessive volume of blood received in culture bottles  Culture   Final    NO GROWTH 5 DAYS Performed at [facility], [address] [place], [place] [address]    Report Status 02/23/2018 FINAL  Final  Blood Culture (routine x 2)     Status: None   Collection Time: 02/18/18  7:55 AM  Result Value Ref Range Status   Specimen Description BLOOD RIGHT FA  Final   Special Requests   Final    BOTTLES DRAWN AEROBIC AND ANAEROBIC Blood Culture results may not be optimal due to an excessive volume of blood received in culture bottles   Culture   Final    NO GROWTH 5 DAYS Performed at [facility], [address] [place], [place] [address]    Report Status 02/23/2018 FINAL  Final    Coagulation Studies: No results for input(s): LABPROT, INR in the last 72 hours.  Urinalysis: No results for input(s): COLORURINE, LABSPEC, PHURINE, GLUCOSEU, HGBUR, BILIRUBINUR, KETONESUR, PROTEINUR, UROBILINOGEN, NITRITE, LEUKOCYTESUR in the last 72  hours.  Invalid input(s): APPERANCEUR    Imaging: No results found.   Medications:   . sodium chloride Stopped (02/22/18 1826)  . ceFEPime (MAXIPIME) IV Stopped (02/22/18 1806)  . heparin 1,150 Units/hr (02/23/18 1300)  . vancomycin Stopped (02/21/18 1400)   . aspirin EC  81 mg Oral Daily  . atorvastatin  80 mg Oral QHS  . B-complex with vitamin C  1 tablet Oral Daily  . carvedilol  3.125 mg Oral BID WC  . Chlorhexidine Gluconate Cloth  6 each Topical Q0600  . docusate sodium  100 mg Oral BID  . epoetin (EPOGEN/PROCRIT) injection  10,000 Units Intravenous Q T,Th,Sa-HD  . famotidine  10 mg Oral Daily  . feeding supplement (ENSURE ENLIVE)  237 mL Oral BID BM  . folic acid  1 mg Oral Daily  . insulin aspart  0-5 Units Subcutaneous QHS  . insulin aspart  0-9 Units Subcutaneous TID WC  . insulin glargine  4 Units Subcutaneous QHS  . isosorbide mononitrate  30 mg Oral Daily  . midodrine  10 mg Oral Q T,Th,Sa-HD  . mirtazapine  7.5 mg Oral QHS  . multivitamin with minerals  1 tablet Oral Daily  . multivitamin-lutein  1 capsule Oral Daily  . omega-3 acid ethyl esters  1 capsule Oral Daily  . sevelamer carbonate  800 mg Oral TID WC  . vitamin B-12  1,000 mcg Oral Daily   sodium chloride, acetaminophen **OR** acetaminophen, metoCLOPramide (REGLAN) injection, nitroGLYCERIN, ondansetron **OR** ondansetron (ZOFRAN) IV, oxyCODONE  Assessment/ Plan:  Stephanie Ellis is a 67 y.o. african [place] female with end stage renal disease on peritoneal dialysis, hypertension, congestive heart failure, coronary artery disease status post CABG, diabetes mellitus type II insulin dependent, GERD, peripheral vascular disease admitted to [facility] on 01/29/2018 for leg pain.    [facility] [place] TTS hemodialysis RIJ permcath Transitioned from PD to HD this month  1. End Stage Renal Disease with hyperkalemia: transitioned from PD to hemodialysis.  -PD catheter will be removed during  amputation surgery. - Hemodialysis TTS schedule.   2.  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 Angiogram on 10/31 Endocarditis (end date was 11/22) - heparin gtt.  -Right BKA planned for this coming Wednesday.    3. Anemia of chronic kidney disease: status post PRBC transfusions during this admission - EPO 10000 units with hemodialysis treatment.   4. Hypotension:   - Midodrine before dialysis.   5. Secondary Hyperparathyroidism:  -Currently on sevelamer  800 three times daily  Lavonia Dana, MD [facility]  11/4/20193:56 PM

## 2018-02-23 NOTE — Progress Notes (Addendum)
ANTICOAGULATION CONSULT NOTE - Initial Consult  Pharmacy Consult for Heparin Drip Indication: VTE treatment  Patient Measurements: Height: 5' 4"  (162.6 cm) Weight: 143 lb 1.3 oz (64.9 kg) IBW/kg (Calculated) : 54.7  Vital Signs: Temp: 98.3 F (36.8 C) (11/04 0715) Temp Source: Oral (11/04 0715) BP: 150/68 (11/04 0715) Pulse Rate: 69 (11/04 0715)  Labs: Recent Labs    02/21/18 0443 02/21/18 0927 02/22/18 0620 02/23/18 0615  HGB 8.5* 7.6* 8.1* 8.0*  HCT 28.3* 25.7* 27.6* 27.3*  PLT 321 322 319 365  HEPARINUNFRC 0.45  --  0.43 0.25*  CREATININE  --  5.56*  --   --     Medical History: Past Medical History:  Diagnosis Date  . (HFpEF) heart failure with preserved ejection fraction ([place])    a. 01/2018 Echo: EF 50-55%, no rwma, Gr1 DD, triv AI, mild MR. Midly dil LA/RV, mod reduced RV fxn. Irreg thickening of TV w/ mobile echodensity that appears to arise from valve.  . Anemia   . Anorexia   . Bacteremia due to Pseudomonas   . Carotid arterial disease ([place])    a. 01/2017 Carotid U/S: RICA <19, LICA <62.  Marland Kitchen Complication of anesthesia    a. Pt reports h/o complication on 9 different occasions - ? hypotension/arrest.  . 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 01/2015 Cath [facility]): 70% LM, p-mLAD 50-60% (Resting FFR 0.75), mRCA 80-90%, ~40 Ost OM & D1-->CABG; b. 01/2016 Staged PCI of LCX x 2 and RCA.  Marland Kitchen ESRD (end stage renal disease) on dialysis ([place])    a. ESRD secondary to acute kidney failure s/p CABG-->PD  . Essential hypertension   . GERD (gastroesophageal reflux disease)   . Heart murmur   . Hypercholesterolemia   . Myocardial infarction ([place]) 2016  . PVD (peripheral vascular disease) ([place])    a. 01/30/2018 PV Angio: Sev Left Popliteal dzs s/p PTA w/ 89m lutonix DEB w/ mech aspiration of the L popliteal, L AT, and Left tibioperoneal trunck and peroneal artery.  . 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  [facility]: Dr. BMarland KitchenHaithcock: CABG x 3, LIMA  to LAD, SVG to RCA, SVG to OM3, EVH  . Sinusitis 2019  . Type II diabetes mellitus with complication (HCC)    CAD    Medications:  Scheduled:  . aspirin EC  81 mg Oral Daily  . atorvastatin  80 mg Oral QHS  . carvedilol  3.125 mg Oral BID WC  . Chlorhexidine Gluconate Cloth  6 each Topical Q0600  . docusate sodium  100 mg Oral BID  . epoetin (EPOGEN/PROCRIT) injection  10,000 Units Intravenous Q T,Th,Sa-HD  . famotidine  10 mg Oral Daily  . feeding supplement (ENSURE ENLIVE)  237 mL Oral BID BM  . folic acid  1 mg Oral Daily  . insulin aspart  0-5 Units Subcutaneous QHS  . insulin aspart  0-9 Units Subcutaneous TID WC  . insulin glargine  4 Units Subcutaneous QHS  . isosorbide mononitrate  30 mg Oral Daily  . midodrine  10 mg Oral Q T,Th,Sa-HD  . mirtazapine  7.5 mg Oral QHS  . multivitamin with minerals  2 tablet Oral Daily  . omega-3 acid ethyl esters  1 capsule Oral Daily  . sevelamer carbonate  800 mg Oral TID WC  . vitamin B-12  1,000 mcg Oral Daily   Infusions:  . sodium chloride Stopped (02/22/18 1826)  . ceFEPime (MAXIPIME) IV Stopped (02/22/18 1806)  .  heparin 1,050 Units/hr (02/23/18 0720)  . vancomycin Stopped (02/21/18 1400)    Assessment: Pharmacy consulted to dose and monitor heparin drip for this 67 yo female presenting to ED with limb threatening ischemia bilateral legs. Baseline troponin 0.11.  Vascular surgery procedure 10/31. She was on apixaban PTA. Baseline aPTT 45s, no baseline heparin level drawn.  She refuses all lab draws except for those done during HD sessions. This morning she was taken by vascular for an angioplasty and the infusion ran during the entire procedure. She is scheduled for HD this evening, therefore, aPTT and HL will be ordered with this session's labs.  Heparin course:  Initiation: 4000 unit bolus, then 1050 units/hr 10/30 1828 aPTT 91s, HL 0.71 10/31 APTT 136 HL 0.39   Goal of Therapy:  Heparin level 0.3-0.7 units/ml Monitor  platelets by anticoagulation protocol: Yes   Plan:  11/4 Heparin level 0.25  Will bolus 800 units and increase heparin rate to 1150 units/hr. Will recheck heparin level in 8 hours until 2 consecutive therapeutic levels then daily and daily CBC  Forrest Moron, PharmD Clinical Pharmacist 02/23/2018,7:59 AM

## 2018-02-23 NOTE — Progress Notes (Signed)
ANTICOAGULATION CONSULT NOTE - Follow up [place] for Heparin Drip Indication: VTE treatment  Patient Measurements: Height: 5' 4"  (162.6 cm) Weight: 143 lb 1.3 oz (64.9 kg) IBW/kg (Calculated) : 54.7  Vital Signs: Temp: 97.4 F (36.3 C) (11/04 1120) Temp Source: Oral (11/04 1120) BP: 142/67 (11/04 1120) Pulse Rate: 66 (11/04 1120)  Labs: Recent Labs    02/21/18 0927 02/22/18 0620 02/23/18 0615 02/23/18 1633  HGB 7.6* 8.1* 8.0*  --   HCT 25.7* 27.6* 27.3*  --   PLT 322 319 365  --   HEPARINUNFRC  --  0.43 0.25* 0.37  CREATININE 5.56*  --   --   --     Medical History: Past Medical History:  Diagnosis Date  . (HFpEF) heart failure with preserved ejection fraction ([place])    a. 01/2018 Echo: EF 50-55%, no rwma, Gr1 DD, triv AI, mild MR. Midly dil LA/RV, mod reduced RV fxn. Irreg thickening of TV w/ mobile echodensity that appears to arise from valve.  . Anemia   . Anorexia   . Bacteremia due to Pseudomonas   . Carotid arterial disease ([place])    a. 01/2017 Carotid U/S: RICA <42, LICA <87.  Marland Kitchen Complication of anesthesia    a. Pt reports h/o complication on 9 different occasions - ? hypotension/arrest.  . 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 01/2015 Cath [facility]): 70% LM, p-mLAD 50-60% (Resting FFR 0.75), mRCA 80-90%, ~40 Ost OM & D1-->CABG; b. 01/2016 Staged PCI of LCX x 2 and RCA.  Marland Kitchen ESRD (end stage renal disease) on dialysis ([place])    a. ESRD secondary to acute kidney failure s/p CABG-->PD  . Essential hypertension   . GERD (gastroesophageal reflux disease)   . Heart murmur   . Hypercholesterolemia   . Myocardial infarction ([place]) 2016  . PVD (peripheral vascular disease) ([place])    a. 01/30/2018 PV Angio: Sev Left Popliteal dzs s/p PTA w/ 26m lutonix DEB w/ mech aspiration of the L popliteal, L AT, and Left tibioperoneal trunck and peroneal artery.  . 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  [facility]: Dr. BMarland KitchenHaithcock: CABG x 3,  LIMA to LAD, SVG to RCA, SVG to OM3, EVH  . Sinusitis 2019  . Type II diabetes mellitus with complication (HCC)    CAD    Medications:  Scheduled:  . aspirin EC  81 mg Oral Daily  . atorvastatin  80 mg Oral QHS  . B-complex with vitamin C  1 tablet Oral Daily  . carvedilol  3.125 mg Oral BID WC  . Chlorhexidine Gluconate Cloth  6 each Topical Q0600  . docusate sodium  100 mg Oral BID  . epoetin (EPOGEN/PROCRIT) injection  10,000 Units Intravenous Q T,Th,Sa-HD  . famotidine  10 mg Oral Daily  . feeding supplement (ENSURE ENLIVE)  237 mL Oral BID BM  . folic acid  1 mg Oral Daily  . insulin aspart  0-5 Units Subcutaneous QHS  . insulin aspart  0-9 Units Subcutaneous TID WC  . insulin glargine  4 Units Subcutaneous QHS  . isosorbide mononitrate  30 mg Oral Daily  . midodrine  10 mg Oral Q T,Th,Sa-HD  . mirtazapine  7.5 mg Oral QHS  . multivitamin with minerals  1 tablet Oral Daily  . multivitamin-lutein  1 capsule Oral Daily  . omega-3 acid ethyl esters  1 capsule Oral Daily  . sevelamer carbonate  800 mg Oral TID WC  .  vitamin B-12  1,000 mcg Oral Daily   Infusions:  . sodium chloride Stopped (02/22/18 1826)  . ceFEPime (MAXIPIME) IV 1 g (02/23/18 1703)  . heparin 1,150 Units/hr (02/23/18 1300)  . vancomycin Stopped (02/21/18 1400)    Assessment: Pharmacy consulted to dose and monitor heparin drip for this 67 yo female presenting to ED with limb threatening ischemia bilateral legs. Baseline troponin 0.11.  Vascular surgery procedure 10/31. She was on apixaban PTA. Baseline aPTT 45s, no baseline heparin level drawn.  She refuses all lab draws except for those done during HD sessions. This morning she was taken by vascular for an angioplasty and the infusion ran during the entire procedure. She is scheduled for HD this evening, therefore, aPTT and HL will be ordered with this session's labs.  Heparin course:  Initiation: 4000 unit bolus, then 1050 units/hr 10/30 1828 aPTT 91s,  HL 0.71 10/31 APTT 136 HL 0.39  11/4 Heparin level 0.25 - Will bolus 800 units and increase heparin rate to 1150 units/hr.   Goal of Therapy:  Heparin level 0.3-0.7 units/ml Monitor platelets by anticoagulation protocol: Yes   Plan:  11/4 1633 heparin level 0.37, therapeutic x1. RN confirms heparin drip running at 11.5 ml/hr, no s/sx of bleeding noted per RN.  Continue current drip rate and recheck in 8h. CBC in AM.   Rocky Morel, PharmD Clinical Pharmacist 02/23/2018,6:00 PM

## 2018-02-23 NOTE — Progress Notes (Signed)
 Vein and Vascular Surgery  Daily Progress Note   Subjective  - 4 Days Post-Op  Very little pain No major events over the weekend  Objective Vitals:   02/22/18 2058 02/23/18 0621 02/23/18 0715 02/23/18 1120  BP: (!) 121/49 (!) 162/76 (!) 150/68 (!) 142/67  Pulse: 72 72 69 66  Resp: 16 18 18    Temp: 98.3 F (36.8 C) 98 F (36.7 C) 98.3 F (36.8 C) (!) 97.4 F (36.3 C)  TempSrc: Oral Oral Oral Oral  SpO2: 93% 95% 93% 96%  Weight:      Height:        Intake/Output Summary (Last 24 hours) at 02/23/2018 1314 Last data filed at 02/23/2018 1300 Gross per 24 hour  Intake 885.49 ml  Output -  Net 885.49 ml    PULM  CTAB CV  RRR VASC  Right foot demarcating at the mid foot on the top of the foot, but all the way across the heel on the plantar aspect of the foot.  Not enough tissue to salvage the right foot even after revascularization.  Right PT pulse is present.  Left foot wounds stable and no erythema or drainage.  Second toe appears to be developing dry gangrene and wounds are present on the foot and ankle.  Laboratory CBC    Component Value Date/Time   WBC 16.6 (H) 02/23/2018 0615   HGB 8.0 (L) 02/23/2018 0615   HCT 27.3 (L) 02/23/2018 0615   PLT 365 02/23/2018 0615    BMET    Component Value Date/Time   NA 139 02/21/2018 0927   K 4.7 02/21/2018 0927   CL 100 02/21/2018 0927   CO2 27 02/21/2018 0927   GLUCOSE 123 (H) 02/21/2018 0927   BUN 26 (H) 02/21/2018 0927   CREATININE 5.56 (H) 02/21/2018 0927   CALCIUM 8.5 (L) 02/21/2018 0927   GFRNONAA 7 (L) 02/21/2018 0927   GFRAA 8 (L) 02/21/2018 0927    Assessment/Planning: S/p RLE revascularization, but right foot is demarcated in mid foot on the top, but all the way back and including the heel on the plantar aspect of the right foot.     Will need right BKA.  Plan for Wednesday.    Asked to have PD catheter removed at the same time which is fine.  Currently using Permcath for HD and this is working  well.  Left foot wounds are stable.  Left foot is marginal, but potentially salvageable.  Podiatry following as well.  No plan for amputation on left leg at this time, but patient is aware she is at high risk for amputation (major) on the left leg in the future as well.      Leotis Pain  02/23/2018, 1:14 PM

## 2018-02-23 NOTE — Care Management Important Message (Signed)
Copy of signed IM left with patient in room.  

## 2018-02-23 NOTE — Progress Notes (Signed)
[facility] [place] NAME: Stephanie Ellis    MR#:  [id number]  DATE OF BIRTH:  1951-02-28  SUBJECTIVE:  No new complaints. Appears cheerful IV heparin gtt REVIEW OF SYSTEMS:  Review of Systems  Constitutional: Negative for chills and fever.  HENT: Negative for congestion and sore throat.   Eyes: Negative for blurred vision and double vision.  Respiratory: Negative for cough and shortness of breath.   Cardiovascular: Negative for chest pain, palpitations and leg swelling.  Gastrointestinal: Negative for abdominal pain, nausea and vomiting.  Genitourinary: Negative for dysuria and frequency.  Musculoskeletal: Negative for back pain and neck pain.  Neurological: Negative for dizziness and headaches.  Psychiatric/Behavioral: Negative for depression. The patient is not nervous/anxious.     DRUG ALLERGIES:   Allergies  Allergen Reactions  . Chlorthalidone Anaphylaxis, Itching and Rash  . Fentanyl Rash  . Midazolam Rash  . Ace Inhibitors Other (See Comments)    Reaction:  Hyperkalemia, agitation   . Angiotensin Receptor Blockers Other (See Comments)    Reaction:  Hyperkalemia, agitation   . Norvasc [Amlodipine] Itching and Rash  . Phenergan [Promethazine Hcl] Anxiety    "antsy, can't sit still"   VITALS:  Blood pressure (!) 150/68, pulse 69, temperature 98.3 F (36.8 C), temperature source Oral, resp. rate 18, height 5' 4"  (1.626 m), weight 64.9 kg, SpO2 93 %. PHYSICAL EXAMINATION:  Physical Exam  GENERAL:  68 y.o.-year-old patient lying in the bed with no acute distress.  EYES: Pupils equal, round, reactive to light and accommodation. No scleral icterus. Extraocular muscles intact.  HEENT: Head atraumatic, normocephalic. Oropharynx and nasopharynx clear.  NECK:  Supple, no jugular venous distention. No thyroid enlargement, no tenderness.  LUNGS: Normal breath sounds bilaterally, no wheezing, rales,rhonchi or crepitation. No use of  accessory muscles of respiration.  CARDIOVASCULAR: RRR, S1, S2 normal. No murmurs, rubs, or gallops.  ABDOMEN: Soft, nontender, nondistended. Bowel sounds present. No organomegaly or mass.  EXTREMITIES: +weak pulse on right foot, right foot with necrosis of all 5 toes extending up the plantar surface. +left great toe amputation with some dorsal necrosis present. No foul odor NEUROLOGIC: Cranial nerves II through XII are intact. gen weakness Sensation intact. Gait not checked.  PSYCHIATRIC: patient is alert and oriented x 3.  SKIN: see above LABORATORY PANEL:  Female CBC Recent Labs  Lab 02/23/18 0615  WBC 16.6*  HGB 8.0*  HCT 27.3*  PLT 365   ------------------------------------------------------------------------------------------------------------------ Chemistries  Recent Labs  Lab 02/18/18 0756  02/21/18 0927  NA 139   < > 139  K 4.4   < > 4.7  CL 97*   < > 100  CO2 29   < > 27  GLUCOSE 87   < > 123*  BUN 27*   < > 26*  CREATININE 5.23*   < > 5.56*  CALCIUM 8.7*   < > 8.5*  AST 22  --   --   ALT <5  --   --   ALKPHOS 154*  --   --   BILITOT 0.4  --   --    < > = values in this interval not displayed.   RADIOLOGY:  No results found. ASSESSMENT AND PLAN:  Stephanie Ellis  is a 68 y.o. 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  She was sent out to rehab on Eliquis.  She was brought  back in with ischemia of the right foot and toes and also ischemia on the left foot and toe.  *Bilateral lower extremity gangrene- s/p angiogram of RLE this morning with stents placed, also s/p left great toe amputation at last hospitalization. -Vascular and podiatry following -Anticipate that patient will  need bilateral amputations per Dr Lucky Cowboy -on IV  vancomycin and cefepime  -Continue heparin drip  *Recent history of endocarditis- s/p septic embolization treated with revascularization -Continue  antibiotics as above  *ESRD on HD TTS -Nephrology consulted -She would like her perm cath be removed  *T2DM- blood sugars well-controlled -Continue lantus and SSI  * Elevated troponin with history of CAD s/p CABG- stable, no active chest pain. Troponin likely elevated due to reduced renal clearance. -Continue aspirin -On heparin drip  * Chronic diastolic CHF- no signs of volume overload -fluid management per nephro  *Staph aureus bacteremia. Has tricuspid endocarditis Will be getting  IV Cefazolin until 03/13/18 ( currently on vanco/cefepime instead) Pt has perm cath  All the records are reviewed and case discussed with Care Management/Social Worker. Management plans discussed with the patient, family and they are in agreement.  CODE STATUS: Full Code  TOTAL TIME TAKING CARE OF THIS PATIENT: 25 minutes.   More than 50% of the time was spent in counseling/coordination of care: YES  POSSIBLE D/C in ?? DAYS, DEPENDING ON CLINICAL CONDITION.   Fritzi Mandes M.D on 02/23/2018 at 8:18 AM  Between 7am to 6pm - Pager - [phone]  After 6pm go to [email or web address] - Proofreader  [facility]  Office  [phone]  CC: Primary care physician; System, Pcp Not In  Note: This dictation was prepared with Dragon dictation along with smaller phrase technology. Any transcriptional errors that result from this process are unintentional.

## 2018-02-24 ENCOUNTER — Other Ambulatory Visit (INDEPENDENT_AMBULATORY_CARE_PROVIDER_SITE_OTHER): Payer: Self-pay | Admitting: Nurse Practitioner

## 2018-02-24 LAB — CBC
HCT: 27.3 % — ABNORMAL LOW (ref 36.0–46.0)
Hemoglobin: 8 g/dL — ABNORMAL LOW (ref 12.0–15.0)
MCH: 30.5 pg (ref 26.0–34.0)
MCHC: 29.3 g/dL — ABNORMAL LOW (ref 30.0–36.0)
MCV: 104.2 fL — ABNORMAL HIGH (ref 80.0–100.0)
Platelets: 369 10*3/uL (ref 150–400)
RBC: 2.62 MIL/uL — ABNORMAL LOW (ref 3.87–5.11)
RDW: 20.8 % — ABNORMAL HIGH (ref 11.5–15.5)
WBC: 14.9 10*3/uL — ABNORMAL HIGH (ref 4.0–10.5)
nRBC: 0.6 % — ABNORMAL HIGH (ref 0.0–0.2)

## 2018-02-24 LAB — RENAL FUNCTION PANEL
Albumin: 1.9 g/dL — ABNORMAL LOW (ref 3.5–5.0)
Anion gap: 9 (ref 5–15)
BUN: 43 mg/dL — ABNORMAL HIGH (ref 8–23)
CO2: 29 mmol/L (ref 22–32)
Calcium: 8.8 mg/dL — ABNORMAL LOW (ref 8.9–10.3)
Chloride: 103 mmol/L (ref 98–111)
Creatinine, Ser: 7 mg/dL — ABNORMAL HIGH (ref 0.44–1.00)
GFR calc Af Amer: 6 mL/min — ABNORMAL LOW (ref >60)
GFR calc non Af Amer: 5 mL/min — ABNORMAL LOW (ref >60)
Glucose, Bld: 156 mg/dL — ABNORMAL HIGH (ref 70–99)
Phosphorus: 2.2 mg/dL — ABNORMAL LOW (ref 2.5–4.6)
Potassium: 5 mmol/L (ref 3.5–5.1)
Sodium: 141 mmol/L (ref 135–145)

## 2018-02-24 LAB — GLUCOSE, CAPILLARY
Glucose-Capillary: 104 mg/dL — ABNORMAL HIGH (ref 70–99)
Glucose-Capillary: 106 mg/dL — ABNORMAL HIGH (ref 70–99)
Glucose-Capillary: 249 mg/dL — ABNORMAL HIGH (ref 70–99)

## 2018-02-24 LAB — HEPARIN LEVEL (UNFRACTIONATED)
Heparin Unfractionated: 0.29 IU/mL — ABNORMAL LOW (ref 0.30–0.70)
Heparin Unfractionated: 0.36 IU/mL (ref 0.30–0.70)
Heparin Unfractionated: 0.45 IU/mL (ref 0.30–0.70)

## 2018-02-24 LAB — VANCOMYCIN, RANDOM: Vancomycin Rm: 11

## 2018-02-24 MED ORDER — OLOPATADINE HCL 0.1 % OP SOLN
1.0000 [drp] | Freq: Two times a day (BID) | OPHTHALMIC | Status: DC
  Administered 2018-02-24 – 2018-02-28 (×9): 1 [drp] via OPHTHALMIC
  Filled 2018-02-24: qty 5

## 2018-02-24 MED ORDER — VANCOMYCIN HCL IN DEXTROSE 1-5 GM/200ML-% IV SOLN
1000.0000 mg | INTRAVENOUS | Status: DC
  Filled 2018-02-24 (×2): qty 200

## 2018-02-24 NOTE — Progress Notes (Signed)
ANTICOAGULATION CONSULT NOTE - Follow up [place] for Heparin Drip Indication: VTE treatment  Patient Measurements: Height: 5' 4"  (162.6 cm) Weight: 143 lb 4.8 oz (65 kg) IBW/kg (Calculated) : 54.7  Vital Signs: Temp: 98.2 F (36.8 C) (11/05 1050) Temp Source: Oral (11/05 1050) BP: 181/59 (11/05 1245) Pulse Rate: 69 (11/05 1245)  Labs: Recent Labs    02/22/18 0620 02/23/18 0615 02/23/18 1633 02/24/18 0239 02/24/18 1004  HGB 8.1* 8.0*  --  8.0*  --   HCT 27.6* 27.3*  --  27.3*  --   PLT 319 365  --  369  --   HEPARINUNFRC 0.43 0.25* 0.37 0.29* 0.36  CREATININE  --   --   --  7.00*  --     Medical History: Past Medical History:  Diagnosis Date  . (HFpEF) heart failure with preserved ejection fraction ([place])    a. 01/2018 Echo: EF 50-55%, no rwma, Gr1 DD, triv AI, mild MR. Midly dil LA/RV, mod reduced RV fxn. Irreg thickening of TV w/ mobile echodensity that appears to arise from valve.  . Anemia   . Anorexia   . Bacteremia due to Pseudomonas   . Carotid arterial disease ([place])    a. 01/2017 Carotid U/S: RICA <61, LICA <44.  Marland Kitchen Complication of anesthesia    a. Pt reports h/o complication on 9 different occasions - ? hypotension/arrest.  . 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 01/2015 Cath [facility]): 70% LM, p-mLAD 50-60% (Resting FFR 0.75), mRCA 80-90%, ~40 Ost OM & D1-->CABG; b. 01/2016 Staged PCI of LCX x 2 and RCA.  Marland Kitchen ESRD (end stage renal disease) on dialysis ([place])    a. ESRD secondary to acute kidney failure s/p CABG-->PD  . Essential hypertension   . GERD (gastroesophageal reflux disease)   . Heart murmur   . Hypercholesterolemia   . Myocardial infarction ([place]) 2016  . PVD (peripheral vascular disease) ([place])    a. 01/30/2018 PV Angio: Sev Left Popliteal dzs s/p PTA w/ 5m lutonix DEB w/ mech aspiration of the L popliteal, L AT, and Left tibioperoneal trunck and peroneal artery.  . 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   [facility]: Dr. BMarland KitchenHaithcock: CABG x 3, LIMA to LAD, SVG to RCA, SVG to OM3, EVH  . Sinusitis 2019  . Type II diabetes mellitus with complication (HCC)    CAD    Medications:  Scheduled:  . aspirin EC  81 mg Oral Daily  . atorvastatin  80 mg Oral QHS  . B-complex with vitamin C  1 tablet Oral Daily  . carvedilol  3.125 mg Oral BID WC  . Chlorhexidine Gluconate Cloth  6 each Topical Q0600  . docusate sodium  100 mg Oral BID  . epoetin (EPOGEN/PROCRIT) injection  10,000 Units Intravenous Q T,Th,Sa-HD  . famotidine  10 mg Oral Daily  . feeding supplement (ENSURE ENLIVE)  237 mL Oral BID BM  . folic acid  1 mg Oral Daily  . insulin aspart  0-5 Units Subcutaneous QHS  . insulin aspart  0-9 Units Subcutaneous TID WC  . insulin glargine  4 Units Subcutaneous QHS  . isosorbide mononitrate  30 mg Oral Daily  . midodrine  10 mg Oral Q T,Th,Sa-HD  . mirtazapine  7.5 mg Oral QHS  . multivitamin with minerals  1 tablet Oral Daily  . multivitamin-lutein  1 capsule Oral Daily  . omega-3 acid ethyl esters  1 capsule Oral  Daily  . sevelamer carbonate  800 mg Oral TID WC  . vitamin B-12  1,000 mcg Oral Daily   Infusions:  . sodium chloride Stopped (02/22/18 1826)  . ceFEPime (MAXIPIME) IV Stopped (02/23/18 1827)  . heparin 1,250 Units/hr (02/24/18 1033)  . vancomycin      Assessment: Pharmacy consulted to dose and monitor heparin drip for this 67 yo female presenting to ED with limb threatening ischemia bilateral legs. Baseline troponin 0.11.  Vascular surgery procedure 10/31. She was on apixaban PTA. Baseline aPTT 45s, no baseline heparin level drawn.  She refuses all lab draws except for those done during HD sessions. This morning she was taken by vascular for an angioplasty and the infusion ran during the entire procedure. She is scheduled for HD this evening, therefore, aPTT and HL will be ordered with this session's labs.  Heparin course:  Initiation: 4000 unit bolus, then 1050  units/hr 10/30 1828 aPTT 91s, HL 0.71 10/31 APTT 136 HL 0.39  11/4 Heparin level 0.25 - Will bolus 800 units and increase heparin rate to 1150 units/hr.   Goal of Therapy:  Heparin level 0.3-0.7 units/ml Monitor platelets by anticoagulation protocol: Yes   Plan:  11/05 @ 0230 HL 0.29 subtherapeutic. Will increase rate to 1250 units/hr and will recheck HL @ 1100.  11/05 @ 1004 Heparin level 0.36.  Will maintain current rate of 1250 units/hr and recheck heparin level @1800    Forrest Moron, PharmD Clinical Pharmacist 02/24/2018,12:54 PM

## 2018-02-24 NOTE — Progress Notes (Signed)
ANTICOAGULATION CONSULT NOTE - Follow up Stephanie Ellis for Heparin Drip Indication: VTE treatment  Patient Measurements: Height: 5' 4"  (162.6 cm) Weight: 141 lb 1.5 oz (64 kg) IBW/kg (Calculated) : 54.7  Vital Signs: Temp: 98.1 F (36.7 C) (11/05 1507) Temp Source: Oral (11/05 1507) BP: 163/59 (11/05 1645) Pulse Rate: 66 (11/05 1645)  Labs: Recent Labs    02/22/18 0620 02/23/18 0615  02/24/18 0239 02/24/18 1004 02/24/18 1934  HGB 8.1* 8.0*  --  8.0*  --   --   HCT 27.6* 27.3*  --  27.3*  --   --   PLT 319 365  --  369  --   --   HEPARINUNFRC 0.43 0.25*   < > 0.29* 0.36 0.45  CREATININE  --   --   --  7.00*  --   --    < > = values in this interval not displayed.    Medical History: Past Medical History:  Diagnosis Date  . (HFpEF) heart failure with preserved ejection fraction ([place])    a. 01/2018 Echo: EF 50-55%, no rwma, Gr1 DD, triv AI, mild MR. Midly dil LA/RV, mod reduced RV fxn. Irreg thickening of TV w/ mobile echodensity that appears to arise from valve.  . Anemia   . Anorexia   . Bacteremia due to Pseudomonas   . Carotid arterial disease ([place])    a. 01/2017 Carotid U/S: RICA <37, LICA <90.  Marland Kitchen Complication of anesthesia    a. Pt reports h/o complication on 9 different occasions - ? hypotension/arrest.  . 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 01/2015 Cath [facility]): 70% LM, p-mLAD 50-60% (Resting FFR 0.75), mRCA 80-90%, ~40 Ost OM & D1-->CABG; b. 01/2016 Staged PCI of LCX x 2 and RCA.  Marland Kitchen ESRD (end stage renal disease) on dialysis ([place])    a. ESRD secondary to acute kidney failure s/p CABG-->PD  . Essential hypertension   . GERD (gastroesophageal reflux disease)   . Heart murmur   . Hypercholesterolemia   . Myocardial infarction ([place]) 2016  . PVD (peripheral vascular disease) ([place])    a. 01/30/2018 PV Angio: Sev Left Popliteal dzs s/p PTA w/ 60m lutonix DEB w/ mech aspiration of the L popliteal, L AT, and Left  tibioperoneal trunck and peroneal artery.  . 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  [facility]: Dr. BMarland KitchenHaithcock: CABG x 3, LIMA to LAD, SVG to RCA, SVG to OM3, EVH  . Sinusitis 2019  . Type II diabetes mellitus with complication (HCC)    CAD    Medications:  Scheduled:  . aspirin EC  81 mg Oral Daily  . atorvastatin  80 mg Oral QHS  . B-complex with vitamin C  1 tablet Oral Daily  . carvedilol  3.125 mg Oral BID WC  . Chlorhexidine Gluconate Cloth  6 each Topical Q0600  . docusate sodium  100 mg Oral BID  . epoetin (EPOGEN/PROCRIT) injection  10,000 Units Intravenous Q T,Th,Sa-HD  . famotidine  10 mg Oral Daily  . feeding supplement (ENSURE ENLIVE)  237 mL Oral BID BM  . folic acid  1 mg Oral Daily  . insulin aspart  0-5 Units Subcutaneous QHS  . insulin aspart  0-9 Units Subcutaneous TID WC  . insulin glargine  4 Units Subcutaneous QHS  . isosorbide mononitrate  30 mg Oral Daily  . midodrine  10 mg Oral Q T,Th,Sa-HD  . mirtazapine  7.5 mg Oral QHS  .  multivitamin with minerals  1 tablet Oral Daily  . multivitamin-lutein  1 capsule Oral Daily  . olopatadine  1 drop Both Eyes BID  . omega-3 acid ethyl esters  1 capsule Oral Daily  . vitamin B-12  1,000 mcg Oral Daily   Infusions:  . sodium chloride Stopped (02/22/18 1826)  . ceFEPime (MAXIPIME) IV 200 mL/hr at 02/24/18 1812  . heparin 1,250 Units/hr (02/24/18 1815)  . vancomycin      Assessment: Pharmacy consulted to dose and monitor heparin drip for this 67 yo female presenting to ED with limb threatening ischemia bilateral legs. Baseline troponin 0.11.  Vascular surgery procedure 10/31. She was on apixaban PTA. Baseline aPTT 45s, no baseline heparin level drawn.  She refuses all lab draws except for those done during HD sessions. This morning she was taken by vascular for an angioplasty and the infusion ran during the entire procedure. She is scheduled for HD this evening, therefore, aPTT and HL will be ordered with this session's  labs.   Goal of Therapy:  Heparin level 0.3-0.7 units/ml Monitor platelets by anticoagulation protocol: Yes   Plan:  11/05 @ 1934 Heparin level 0.45. Level now therapeutic x 2.   Will continue current rate of 1250 units/hr and recheck heparin level and CBC with AM labs.    Pernell Dupre, PharmD, BCPS Clinical Pharmacist 02/24/2018 8:03 PM

## 2018-02-24 NOTE — Progress Notes (Signed)
Post HD assessment. Pt tolerated tx well without c/o or complication. Net UF 2072, goal met.    02/24/18 1441  Vital Signs  Temp 98.5 F (36.9 C)  Temp Source Oral  Pulse Rate 69  Pulse Rate Source Monitor  Resp 14  BP (!) 189/71  BP Location Left Arm  BP Method Automatic  Patient Position (if appropriate) Sitting  Oxygen Therapy  SpO2 97 %  O2 Device Room Air  Dialysis Weight  Weight 64 kg  Type of Weight Post-Dialysis  Post-Hemodialysis Assessment  Rinseback Volume (mL) 250 mL  KECN 77.9 V  Dialyzer Clearance Lightly streaked  Duration of HD Treatment -hour(s) 3.5 hour(s)  Hemodialysis Intake (mL) 650 mL  UF Total -Machine (mL) 2722 mL  Net UF (mL) 2072 mL  Tolerated HD Treatment Yes  Education / Care Plan  Dialysis Education Provided Yes  Documented Education in Care Plan Yes  Hemodialysis Catheter Left Subclavian Double-lumen  Placement Date: 02/11/18   Placed prior to admission: No  Orientation: Left  Access Location: Subclavian  Hemodialysis Catheter Type: Double-lumen  Site Condition No complications  Blue Lumen Status Heparin locked  Red Lumen Status Heparin locked  Purple Lumen Status N/A  Catheter fill solution Heparin 1000 units/ml  Catheter fill volume (Arterial) 1.7 cc  Catheter fill volume (Venous) 1.7  Dressing Type Biopatch  Dressing Status Clean;Dry;Intact  Drainage Description None  Post treatment catheter status Capped and Clamped

## 2018-02-24 NOTE — Progress Notes (Signed)
[facility] Daily Progress Note   Plan: Right below the knee amputation with Dr. Lucky Cowboy on Wed (02/25/18) Will pre-op.  Marcelle Overlie PA-C 02/24/2018 12:50 PM

## 2018-02-24 NOTE — Progress Notes (Signed)
Post HD assessment    02/24/18 1440  Neurological  Level of Consciousness Alert  Orientation Level Oriented X4  Respiratory  Respiratory Pattern Regular;Unlabored  Chest Assessment Chest expansion symmetrical  Cardiac  ECG Monitor Yes  Cardiac Rhythm NSR  Vascular  R Radial Pulse +2  L Radial Pulse +2  Edema Generalized;Facial;Right lower extremity  Integumentary  Integumentary (WDL) X  Skin Color Appropriate for ethnicity  Musculoskeletal  Musculoskeletal (WDL) X  Generalized Weakness Yes  Assistive Device None  GU Assessment  Genitourinary (WDL) X  Genitourinary Symptoms  (HD)  Psychosocial  Psychosocial (WDL) WDL

## 2018-02-24 NOTE — Consult Note (Signed)
Pharmacy Antibiotic Note  Stephanie Ellis is a 66 y.o. female admitted on 02/18/2018 with cellulitis and h/o endocarditis.  Pharmacy has been consulted for vancomycin and cefepime dosing. The plan is for a possible BKA due to extensive gangrenous tissue  Plan: 1) Vanco random level - 11.  Increased Vancomycin 1000 mg IV T/Th/Sa with HD. She received total of 1571m on 10/30. Unclear why patient did not receive post HD dose on 10/31 or why dose was not given on 11/1. Dose scheduled w/HD today.  Will draw random level prior to dialysis on Thurs 11/7 to reassess current dose. Goal pre-dialysis level < 20 - 25 mcg/mL  2) cefepime 1g every 24 hours. She received a 2 gram pre-op dose on 10/31. Dose missed 11/1, unclear why.   Height: 5' 4"  (162.6 cm) Weight: 143 lb 1.3 oz (64.9 kg) IBW/kg (Calculated) : 54.7  Temp (24hrs), Avg:98.1 F (36.7 C), Min:97.4 F (36.3 C), Max:98.6 F (37 C)  Recent Labs  Lab 02/18/18 0754 02/18/18 0756 02/18/18 1546  02/20/18 0340 02/21/18 0443 02/21/18 0927 02/22/18 0620 02/23/18 0615 02/24/18 0239  WBC  --  16.6*  --    < > 13.1* 15.4* 16.4* 16.4* 16.6* 14.9*  CREATININE  --  5.23*  --   --  3.51*  --  5.56*  --   --  7.00*  LATICACIDVEN 1.1  --  1.1  --   --   --   --   --   --   --   VANCORANDOM  --   --   --   --   --   --   --   --   --  11   < > = values in this interval not displayed.    Estimated Creatinine Clearance: 6.7 mL/min (A) (by C-G formula based on SCr of 7 mg/dL (H)).     Antimicrobials this admission: Vanc 10/30>> Cefepime 10/30>> Flagyl 10/30>>11/02  Microbiology results: 10/30 Bcx NGTD x5days 10/25 Bcx NGF 10/25 UCX NGF 10/18 Wcx pan-S S aureus  Thank you for allowing pharmacy to be a part of this patient's care.  CForrest Moron PharmD Clinical Pharmacist 02/24/2018 10:23 AM

## 2018-02-24 NOTE — Progress Notes (Signed)
HD tx end    02/24/18 1434  Vital Signs  Pulse Rate 69  Pulse Rate Source Monitor  Resp 18  BP (!) 186/68  BP Location Left Arm  BP Method Automatic  Patient Position (if appropriate) Sitting  Oxygen Therapy  SpO2 98 %  O2 Device Room Air  During Hemodialysis Assessment  Dialysis Fluid Bolus Normal Saline  Bolus Amount (mL) 250 mL  Intra-Hemodialysis Comments Tx completed

## 2018-02-24 NOTE — Progress Notes (Signed)
Pt was in the process of anticipatory grief about her impending surgery. She said she was scared, she also expressed deep concern about her self image. She felt she can never be whole again after the amputation. She said she was    02/24/18 2000  Clinical Encounter Type  Visited With Patient and family together  Visit Type Follow-up  Referral From Family;Nurse  Consult/Referral To Chaplain  Recommendations  (Pt would benefit from a follow up after surgery)  Spiritual Encounters  Spiritual Needs Prayer;Emotional;Grief support;Other (Comment)  having a hard time making sense of her situation. Pt enjoys good family support.

## 2018-02-24 NOTE — Progress Notes (Signed)
Per MD okay for RN to place order for type and screening for tomorrow's surgery.

## 2018-02-24 NOTE — Progress Notes (Signed)
Pre HD assessment    02/24/18 1051  Neurological  Level of Consciousness Alert  Orientation Level Oriented X4  Respiratory  Respiratory Pattern Regular;Unlabored  Chest Assessment Chest expansion symmetrical  Cardiac  ECG Monitor Yes  Cardiac Rhythm NSR  Vascular  R Radial Pulse +2  L Radial Pulse +2  Edema Generalized;Facial;Right lower extremity  Integumentary  Integumentary (WDL) X  Skin Color Appropriate for ethnicity  Musculoskeletal  Musculoskeletal (WDL) X  Generalized Weakness Yes  Assistive Device None  GU Assessment  Genitourinary (WDL) X  Genitourinary Symptoms  (HD)  Psychosocial  Psychosocial (WDL) WDL

## 2018-02-24 NOTE — Progress Notes (Signed)
ANTICOAGULATION CONSULT NOTE - Follow up [facility] for Heparin Drip Indication: VTE treatment  Patient Measurements: Height: 5' 4"  (162.6 cm) Weight: 143 lb 1.3 oz (64.9 kg) IBW/kg (Calculated) : 54.7  Vital Signs: Temp: 98.1 F (36.7 C) (11/04 2313) Temp Source: Oral (11/04 2313) BP: 164/62 (11/04 2313) Pulse Rate: 68 (11/04 2313)  Labs: Recent Labs    02/21/18 0927 02/22/18 0620 02/23/18 0615 02/23/18 1633 02/24/18 0239  HGB 7.6* 8.1* 8.0*  --  8.0*  HCT 25.7* 27.6* 27.3*  --  27.3*  PLT 322 319 365  --  369  HEPARINUNFRC  --  0.43 0.25* 0.37 0.29*  CREATININE 5.56*  --   --   --  7.00*    Medical History: Past Medical History:  Diagnosis Date  . (HFpEF) heart failure with preserved ejection fraction ([place])    a. 01/2018 Echo: EF 50-55%, no rwma, Gr1 DD, triv AI, mild MR. Midly dil LA/RV, mod reduced RV fxn. Irreg thickening of TV w/ mobile echodensity that appears to arise from valve.  . Anemia   . Anorexia   . Bacteremia due to Pseudomonas   . Carotid arterial disease ([place])    a. 01/2017 Carotid U/S: RICA <26, LICA <20.  Marland Kitchen Complication of anesthesia    a. Pt reports h/o complication on 9 different occasions - ? hypotension/arrest.  . 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 01/2015 Cath [facility]): 70% LM, p-mLAD 50-60% (Resting FFR 0.75), mRCA 80-90%, ~40 Ost OM & D1-->CABG; b. 01/2016 Staged PCI of LCX x 2 and RCA.  Marland Kitchen ESRD (end stage renal disease) on dialysis ([place])    a. ESRD secondary to acute kidney failure s/p CABG-->PD  . Essential hypertension   . GERD (gastroesophageal reflux disease)   . Heart murmur   . Hypercholesterolemia   . Myocardial infarction ([place]) 2016  . PVD (peripheral vascular disease) ([place])    a. 01/30/2018 PV Angio: Sev Left Popliteal dzs s/p PTA w/ 50m lutonix DEB w/ mech aspiration of the L popliteal, L AT, and Left tibioperoneal trunck and peroneal artery.  . 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   [facility]: Dr. BMarland KitchenHaithcock: CABG x 3, LIMA to LAD, SVG to RCA, SVG to OM3, EVH  . Sinusitis 2019  . Type II diabetes mellitus with complication (HCC)    CAD    Medications:  Scheduled:  . aspirin EC  81 mg Oral Daily  . atorvastatin  80 mg Oral QHS  . B-complex with vitamin C  1 tablet Oral Daily  . carvedilol  3.125 mg Oral BID WC  . Chlorhexidine Gluconate Cloth  6 each Topical Q0600  . docusate sodium  100 mg Oral BID  . epoetin (EPOGEN/PROCRIT) injection  10,000 Units Intravenous Q T,Th,Sa-HD  . famotidine  10 mg Oral Daily  . feeding supplement (ENSURE ENLIVE)  237 mL Oral BID BM  . folic acid  1 mg Oral Daily  . insulin aspart  0-5 Units Subcutaneous QHS  . insulin aspart  0-9 Units Subcutaneous TID WC  . insulin glargine  4 Units Subcutaneous QHS  . isosorbide mononitrate  30 mg Oral Daily  . midodrine  10 mg Oral Q T,Th,Sa-HD  . mirtazapine  7.5 mg Oral QHS  . multivitamin with minerals  1 tablet Oral Daily  . multivitamin-lutein  1 capsule Oral Daily  . omega-3 acid ethyl esters  1 capsule Oral Daily  . sevelamer carbonate  800 mg Oral TID WC  . vitamin B-12  1,000 mcg Oral Daily   Infusions:  . sodium chloride Stopped (02/22/18 1826)  . ceFEPime (MAXIPIME) IV Stopped (02/23/18 1827)  . heparin 1,150 Units/hr (02/24/18 0200)  . vancomycin Stopped (02/21/18 1400)    Assessment: Pharmacy consulted to dose and monitor heparin drip for this 67 yo female presenting to ED with limb threatening ischemia bilateral legs. Baseline troponin 0.11.  Vascular surgery procedure 10/31. She was on apixaban PTA. Baseline aPTT 45s, no baseline heparin level drawn.  She refuses all lab draws except for those done during HD sessions. This morning she was taken by vascular for an angioplasty and the infusion ran during the entire procedure. She is scheduled for HD this evening, therefore, aPTT and HL will be ordered with this session's labs.  Heparin course:  Initiation: 4000 unit  bolus, then 1050 units/hr 10/30 1828 aPTT 91s, HL 0.71 10/31 APTT 136 HL 0.39  11/4 Heparin level 0.25 - Will bolus 800 units and increase heparin rate to 1150 units/hr.   Goal of Therapy:  Heparin level 0.3-0.7 units/ml Monitor platelets by anticoagulation protocol: Yes   Plan:  11/05 @ 0230 HL 0.29 subtherapeutic. Will increase rate to 1250 units/hr and will recheck HL @ 1100.   Tobie Lords, PharmD Clinical Pharmacist 02/24/2018,3:39 AM

## 2018-02-24 NOTE — Progress Notes (Signed)
Pre HD assessment   02/24/18 1050  Vital Signs  Temp 98.2 F (36.8 C)  Temp Source Oral  Pulse Rate 67  Pulse Rate Source Monitor  Resp 14  BP (!) 173/70  BP Location Left Arm  BP Method Automatic  Patient Position (if appropriate) Sitting  Oxygen Therapy  SpO2 94 %  O2 Device Room Air  Pain Assessment  Pain Scale 0-10  Pain Score 0  Dialysis Weight  Weight 65 kg  Type of Weight Pre-Dialysis  Time-Out for Hemodialysis  What Procedure? HD  Pt Identifiers(min of two) First/Last Name;MRN/Account#  Correct Site? Yes  Correct Side? Yes  Correct Procedure? Yes  Consents Verified? Yes  Rad Studies Available? N/A  Safety Precautions Reviewed? Yes  Research scientist (physical sciences)  (2A)  Station Number 1  UF/Alarm Test Passed  Conductivity: Meter 14  Conductivity: Machine  14.1  pH 7.4  Reverse Osmosis main  Normal Saline Lot Number [id number]  Dialyzer Lot Number [id number]  Disposable Set Lot Number [id number]  Machine Temperature 98.6 F (37 C)  Musician and Audible Yes  Blood Lines Intact and Secured Yes  Pre Treatment Patient Checks  Vascular access used during treatment Catheter  Patient is receiving dialysis in a chair Yes  Hepatitis B Surface Antigen Results Negative  Date Hepatitis B Surface Antigen Drawn 11/26/17  Hepatitis B Surface Antibody  (>10)  Date Hepatitis B Surface Antibody Drawn 11/26/17  Hemodialysis Consent Verified Yes  Hemodialysis Standing Orders Initiated Yes  ECG (Telemetry) Monitor On Yes  Prime Ordered Normal Saline  Length of  DialysisTreatment -hour(s) 3.5 Hour(s)  Dialyzer Elisio 17H NR  Dialysate 2K, 2.5 Ca  Dialysis Anticoagulant None  Dialysate Flow Ordered 600  Blood Flow Rate Ordered 400 mL/min  Ultrafiltration Goal 2 Liters  Dialysis Blood Pressure Support Ordered Normal Saline  Education / Care Plan  Dialysis Education Provided Yes  Documented Education in Care Plan Yes  Hemodialysis Catheter Left Subclavian  Double-lumen  Placement Date: 02/11/18   Placed prior to admission: No  Orientation: Left  Access Location: Subclavian  Hemodialysis Catheter Type: Double-lumen  Site Condition No complications  Blue Lumen Status Heparin locked  Red Lumen Status Heparin locked  Purple Lumen Status N/A  Dressing Type Biopatch  Dressing Status Clean;Dry;Intact  Drainage Description None

## 2018-02-24 NOTE — Progress Notes (Signed)
[facility]  ROUNDING NOTE   Subjective:   Patient is doing fair No acute complaints States that pain is controlled Trying to eat more   Seen during HD. C/o left eye redness and tearing. Has baseline poor vision, no new changes   HEMODIALYSIS FLOWSHEET:  Blood Flow Rate (mL/min): 400 mL/min Arterial Pressure (mmHg): -200 mmHg Venous Pressure (mmHg): 190 mmHg Transmembrane Pressure (mmHg): 50 mmHg Ultrafiltration Rate (mL/min): 910 mL/min Dialysate Flow Rate (mL/min): 600 ml/min Conductivity: Machine : 13.5 Conductivity: Machine : 13.5 Dialysis Fluid Bolus: Normal Saline Bolus Amount (mL): 250 mL    Objective:  Vital signs in last 24 hours:  Temp:  [98.1 F (36.7 C)-98.6 F (37 C)] 98.2 F (36.8 C) (11/05 1050) Pulse Rate:  [66-71] 69 (11/05 1434) Resp:  [10-21] 18 (11/05 1434) BP: (127-193)/(54-102) 186/68 (11/05 1434) SpO2:  [92 %-100 %] 98 % (11/05 1434) Weight:  [65 kg] 65 kg (11/05 1050)  Weight change:  Filed Weights   02/21/18 0910 02/21/18 1315 02/24/18 1050  Weight: 66.5 kg 64.9 kg 65 kg    Intake/Output: I/O last 3 completed shifts: In: 1038.8 [P.O.:480; I.V.:387.3; IV Piggyback:171.5] Out: -    Intake/Output this shift:  Total I/O In: 81.8 [I.V.:81.8] Out: -   Physical Exam: General: NAD  Head: Normocephalic, atraumatic. Moist oral mucosal membranes  Eyes: Anicteric, left eye redness and tearing  Neck: Supple, trachea midline  Lungs:  Clear to auscultation  Heart: Regular rate and rhythm  Abdomen:  Soft, nontender  Extremities: Bilateral lower extremity gangrene, left foot wound  Neurologic: Nonfocal, moving all four extremities  Skin: No lesions  Access: PD catheter, left IJ tunneled catheter    Basic Metabolic Panel: Recent Labs  Lab 02/18/18 0756 02/20/18 0340 02/21/18 0927 02/24/18 0239  NA 139 137 139 141  K 4.4 4.2 4.7 5.0  CL 97* 100 100 103  CO2 29 27 27 29   GLUCOSE 87 156* 123* 156*  BUN 27* 16 26* 43*   CREATININE 5.23* 3.51* 5.56* 7.00*  CALCIUM 8.7* 7.8* 8.5* 8.8*  PHOS  --  2.2* 4.1 2.2*    Liver Function Tests: Recent Labs  Lab 02/18/18 0756 02/20/18 0340 02/21/18 0927 02/24/18 0239  AST 22  --   --   --   ALT <5  --   --   --   ALKPHOS 154*  --   --   --   BILITOT 0.4  --   --   --   PROT 7.4  --   --   --   ALBUMIN 2.0* 1.7* 1.8* 1.9*   No results for input(s): LIPASE, AMYLASE in the last 168 hours. No results for input(s): AMMONIA in the last 168 hours.  CBC: Recent Labs  Lab 02/18/18 0756  02/21/18 0443 02/21/18 0927 02/22/18 0620 02/23/18 0615 02/24/18 0239  WBC 16.6*   < > 15.4* 16.4* 16.4* 16.6* 14.9*  NEUTROABS 13.4*  --   --   --   --   --   --   HGB 8.0*   < > 8.5* 7.6* 8.1* 8.0* 8.0*  HCT 27.9*   < > 28.3* 25.7* 27.6* 27.3* 27.3*  MCV 103.3*   < > 100.0 101.6* 101.8* 103.4* 104.2*  PLT 403*   < > 321 322 319 365 369   < > = values in this interval not displayed.    Cardiac Enzymes: Recent Labs  Lab 02/18/18 0756 02/19/18 0900  TROPONINI 0.11* 0.07*  BNP: Invalid input(s): POCBNP  CBG: Recent Labs  Lab 02/23/18 0731 02/23/18 1121 02/23/18 1633 02/23/18 2112 02/24/18 0736  GLUCAP 153* 147* 155* 121* 104*    Microbiology: Results for orders placed or performed during the hospital encounter of 02/18/18  Blood Culture (routine x 2)     Status: None   Collection Time: 02/18/18  7:55 AM  Result Value Ref Range Status   Specimen Description BLOOD RIGHT AC  Final   Special Requests   Final    BOTTLES DRAWN AEROBIC AND ANAEROBIC Blood Culture results may not be optimal due to an excessive volume of blood received in culture bottles   Culture   Final    NO GROWTH 5 DAYS Performed at [facility], [address] [place], [place] [address]    Report Status 02/23/2018 FINAL  Final  Blood Culture (routine x 2)     Status: None   Collection Time: 02/18/18  7:55 AM  Result Value Ref Range Status   Specimen Description BLOOD  RIGHT FA  Final   Special Requests   Final    BOTTLES DRAWN AEROBIC AND ANAEROBIC Blood Culture results may not be optimal due to an excessive volume of blood received in culture bottles   Culture   Final    NO GROWTH 5 DAYS Performed at [facility], [address] [place], [place] [address]    Report Status 02/23/2018 FINAL  Final    Coagulation Studies: No results for input(s): LABPROT, INR in the last 72 hours.  Urinalysis: No results for input(s): COLORURINE, LABSPEC, PHURINE, GLUCOSEU, HGBUR, BILIRUBINUR, KETONESUR, PROTEINUR, UROBILINOGEN, NITRITE, LEUKOCYTESUR in the last 72 hours.  Invalid input(s): APPERANCEUR    Imaging: No results found.   Medications:   . sodium chloride Stopped (02/22/18 1826)  . ceFEPime (MAXIPIME) IV Stopped (02/23/18 1827)  . heparin 1,250 Units/hr (02/24/18 1033)  . vancomycin     . aspirin EC  81 mg Oral Daily  . atorvastatin  80 mg Oral QHS  . B-complex with vitamin C  1 tablet Oral Daily  . carvedilol  3.125 mg Oral BID WC  . Chlorhexidine Gluconate Cloth  6 each Topical Q0600  . docusate sodium  100 mg Oral BID  . epoetin (EPOGEN/PROCRIT) injection  10,000 Units Intravenous Q T,Th,Sa-HD  . famotidine  10 mg Oral Daily  . feeding supplement (ENSURE ENLIVE)  237 mL Oral BID BM  . folic acid  1 mg Oral Daily  . insulin aspart  0-5 Units Subcutaneous QHS  . insulin aspart  0-9 Units Subcutaneous TID WC  . insulin glargine  4 Units Subcutaneous QHS  . isosorbide mononitrate  30 mg Oral Daily  . midodrine  10 mg Oral Q T,Th,Sa-HD  . mirtazapine  7.5 mg Oral QHS  . multivitamin with minerals  1 tablet Oral Daily  . multivitamin-lutein  1 capsule Oral Daily  . omega-3 acid ethyl esters  1 capsule Oral Daily  . sevelamer carbonate  800 mg Oral TID WC  . vitamin B-12  1,000 mcg Oral Daily   sodium chloride, acetaminophen **OR** acetaminophen, metoCLOPramide (REGLAN) injection, nitroGLYCERIN, ondansetron **OR** ondansetron  (ZOFRAN) IV, oxyCODONE  Assessment/ Plan:  [name] is a 67 y.o. african [place] female with end stage renal disease on peritoneal dialysis, hypertension, congestive heart failure, coronary artery disease status post CABG, diabetes mellitus type II insulin dependent, GERD, peripheral vascular disease admitted to [facility] on 01/29/2018 for leg pain.    [facility]  [facility] TTS hemodialysis RIJ permcath Transitioned from PD to HD this month  1. End Stage Renal Disease with hyperkalemia: transitioned from PD to hemodialysis.  -PD catheter will be removed during amputation surgery. - Hemodialysis TTS schedule.  Patient seen during dialysis Tolerating well   2.  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 Angiogram on 10/31 Endocarditis (end date was 11/22) - heparin gtt.  -Right BKA planned for this coming Wednesday.    3. Anemia of chronic kidney disease: status post PRBC transfusions during this admission - EPO 10000 units with hemodialysis treatment.   4. Hypotension:   - Midodrine before dialysis.   5. Secondary Hyperparathyroidism:  - Recent Phos is low; will hold renvela  Lavonia Dana, [facility]  11/5/20192:40 PM

## 2018-02-24 NOTE — Progress Notes (Signed)
HD tx start    02/24/18 1057  Vital Signs  Pulse Rate 67  Pulse Rate Source Monitor  Resp 13  BP (!) 178/71  BP Location Left Arm  BP Method Automatic  Patient Position (if appropriate) Lying  Oxygen Therapy  SpO2 98 %  O2 Device Room Air  During Hemodialysis Assessment  Blood Flow Rate (mL/min) 400 mL/min  Arterial Pressure (mmHg) -180 mmHg  Venous Pressure (mmHg) 140 mmHg  Transmembrane Pressure (mmHg) 50 mmHg  Ultrafiltration Rate (mL/min) 710 mL/min  Dialysate Flow Rate (mL/min) 600 ml/min  Conductivity: Machine  14.1  HD Safety Checks Performed Yes  Dialysis Fluid Bolus Normal Saline  Bolus Amount (mL) 250 mL  Intra-Hemodialysis Comments Tx initiated  Hemodialysis Catheter Left Subclavian Double-lumen  Placement Date: 02/11/18   Placed prior to admission: No  Orientation: Left  Access Location: Subclavian  Hemodialysis Catheter Type: Double-lumen  Blue Lumen Status Infusing  Red Lumen Status Infusing

## 2018-02-24 NOTE — Progress Notes (Signed)
[place] [facility] [place] NAME: Stephanie Ellis    MR#:  [id number]  DATE OF BIRTH:  09/10/1950  SUBJECTIVE:  No new complaints. Seen at HD. Left eye itching and watery. Patient has seasonal allergies IV heparin gtt REVIEW OF SYSTEMS:  Review of Systems  Constitutional: Negative for chills and fever.  HENT: Negative for congestion and sore throat.   Eyes: Negative for blurred vision and double vision.  Respiratory: Negative for cough and shortness of breath.   Cardiovascular: Negative for chest pain, palpitations and leg swelling.  Gastrointestinal: Negative for abdominal pain, nausea and vomiting.  Genitourinary: Negative for dysuria and frequency.  Musculoskeletal: Negative for back pain and neck pain.  Neurological: Negative for dizziness and headaches.  Psychiatric/Behavioral: Negative for depression. The patient is not nervous/anxious.     DRUG ALLERGIES:   Allergies  Allergen Reactions  . Chlorthalidone Anaphylaxis, Itching and Rash  . Fentanyl Rash  . Midazolam Rash  . Ace Inhibitors Other (See Comments)    Reaction:  Hyperkalemia, agitation   . Angiotensin Receptor Blockers Other (See Comments)    Reaction:  Hyperkalemia, agitation   . Norvasc [Amlodipine] Itching and Rash  . Phenergan [Promethazine Hcl] Anxiety    "antsy, can't sit still"   VITALS:  Blood pressure (!) 193/70, pulse 68, temperature 98.1 F (36.7 C), temperature source Oral, resp. rate 18, height 5' 4"  (1.626 m), weight 64 kg, SpO2 97 %. PHYSICAL EXAMINATION:  Physical Exam  GENERAL:  67 y.o.-year-old patient lying in the bed with no acute distress.  EYES: Pupils equal, round, reactive to light and accommodation. No scleral icterus. Extraocular muscles intact.  HEENT: Head atraumatic, normocephalic. Oropharynx and nasopharynx clear. Left eye watery and congestion NECK:  Supple, no jugular venous distention. No thyroid enlargement, no tenderness.  LUNGS:  Normal breath sounds bilaterally, no wheezing, rales,rhonchi or crepitation. No use of accessory muscles of respiration.  CARDIOVASCULAR: RRR, S1, S2 normal. No murmurs, rubs, or gallops.  ABDOMEN: Soft, nontender, nondistended. Bowel sounds present. No organomegaly or mass.  EXTREMITIES: +weak pulse on right foot, right foot with necrosis of all 5 toes extending up the plantar surface. +left great toe amputation with some dorsal necrosis present. No foul odor NEUROLOGIC: Cranial nerves II through XII are intact. gen weakness Sensation intact. Gait not checked.  PSYCHIATRIC: patient is alert and oriented x 3.  SKIN: see above LABORATORY PANEL:  Female CBC Recent Labs  Lab 02/24/18 0239  WBC 14.9*  HGB 8.0*  HCT 27.3*  PLT 369   ------------------------------------------------------------------------------------------------------------------ Chemistries  Recent Labs  Lab 02/18/18 0756  02/24/18 0239  NA 139   < > 141  K 4.4   < > 5.0  CL 97*   < > 103  CO2 29   < > 29  GLUCOSE 87   < > 156*  BUN 27*   < > 43*  CREATININE 5.23*   < > 7.00*  CALCIUM 8.7*   < > 8.8*  AST 22  --   --   ALT <5  --   --   ALKPHOS 154*  --   --   BILITOT 0.4  --   --    < > = values in this interval not displayed.   RADIOLOGY:  No results found. ASSESSMENT AND PLAN:  Stephanie Ellis  is a 67 y.o. female with a known history of recent tricuspid endocarditis and ischemia of the left lower extremity status post toe  amputation.  She was sent out to rehab on Eliquis.  She was brought back in with ischemia of the right foot and toes and also ischemia on the left foot and toe.  *Bilateral lower extremity gangrene- s/p angiogram of RLE this morning with stents placed, also s/p left great toe amputation at last hospitalization. -Vascular and podiatry following -Anticipate that patient will  Need ? bilateral amputations per Dr Lucky Cowboy -on IV  vancomycin and cefepime  -Continue heparin drip  *Recent history of  endocarditis- s/p septic embolization treated with revascularization -Continue antibiotics as above  *ESRD on HD TTS -Nephrology consulted -She would like her perm cath be removed  *T2DM- blood sugars well-controlled -Continue lantus and SSI  * Elevated troponin with history of CAD s/p CABG- stable, no active chest pain. Troponin likely elevated due to reduced renal clearance. -Continue aspirin -On heparin drip  * Chronic diastolic CHF- no signs of volume overload -fluid management per nephro  *Staph aureus bacteremia. Has tricuspid endocarditis Will be getting  IV Cefazolin until 03/13/18 ( currently on vanco/cefepime instead) Pt has perm cath  All the records are reviewed and case discussed with Care Management/Social Worker. Management plans discussed with the patient, family and they are in agreement.  CODE STATUS: Full Code  TOTAL TIME TAKING CARE OF THIS PATIENT: 25 minutes.   More than 50% of the time was spent in counseling/coordination of care: YES  POSSIBLE D/C in ?? DAYS, DEPENDING ON CLINICAL CONDITION.   Fritzi Mandes M.D on 02/24/2018 at 3:19 PM  Between 7am to 6pm - Pager - [phone]  After 6pm go to [email or web address] - Proofreader  [facility]  Office  [phone]  CC: Primary care physician; System, Pcp Not In  Note: This dictation was prepared with Dragon dictation along with smaller phrase technology. Any transcriptional errors that result from this process are unintentional.

## 2018-02-25 ENCOUNTER — Encounter: Payer: Self-pay | Admitting: *Deleted

## 2018-02-25 ENCOUNTER — Inpatient Hospital Stay: Payer: Medicare Other | Admitting: Certified Registered"

## 2018-02-25 ENCOUNTER — Encounter
Admission: EM | Disposition: A | Discharge status: Skilled Nursing Facility | Payer: Self-pay | Source: Home / Self Care | Attending: Internal Medicine

## 2018-02-25 DIAGNOSIS — T85898A Other specified complication of other internal prosthetic devices, implants and grafts, initial encounter: Secondary | ICD-10-CM

## 2018-02-25 DIAGNOSIS — I96 Gangrene, not elsewhere classified: Secondary | ICD-10-CM

## 2018-02-25 DIAGNOSIS — N186 End stage renal disease: Secondary | ICD-10-CM

## 2018-02-25 DIAGNOSIS — Z992 Dependence on renal dialysis: Secondary | ICD-10-CM

## 2018-02-25 HISTORY — PX: AMPUTATION: SHX166

## 2018-02-25 HISTORY — PX: REMOVAL OF A DIALYSIS CATHETER: SHX6053

## 2018-02-25 LAB — BASIC METABOLIC PANEL
Anion gap: 10 (ref 5–15)
BUN: 24 mg/dL — ABNORMAL HIGH (ref 8–23)
CO2: 33 mmol/L — ABNORMAL HIGH (ref 22–32)
Calcium: 8.6 mg/dL — ABNORMAL LOW (ref 8.9–10.3)
Chloride: 99 mmol/L (ref 98–111)
Creatinine, Ser: 4.18 mg/dL — ABNORMAL HIGH (ref 0.44–1.00)
GFR calc Af Amer: 12 mL/min — ABNORMAL LOW (ref >60)
GFR calc non Af Amer: 10 mL/min — ABNORMAL LOW (ref >60)
Glucose, Bld: 214 mg/dL — ABNORMAL HIGH (ref 70–99)
Potassium: 4.5 mmol/L (ref 3.5–5.1)
Sodium: 142 mmol/L (ref 135–145)

## 2018-02-25 LAB — GLUCOSE, CAPILLARY
Glucose-Capillary: 185 mg/dL — ABNORMAL HIGH (ref 70–99)
Glucose-Capillary: 113 mg/dL — ABNORMAL HIGH (ref 70–99)
Glucose-Capillary: 114 mg/dL — ABNORMAL HIGH (ref 70–99)
Glucose-Capillary: 101 mg/dL — ABNORMAL HIGH (ref 70–99)
Glucose-Capillary: 119 mg/dL — ABNORMAL HIGH (ref 70–99)

## 2018-02-25 LAB — CBC
HCT: 29 % — ABNORMAL LOW (ref 36.0–46.0)
Hemoglobin: 8.5 g/dL — ABNORMAL LOW (ref 12.0–15.0)
MCH: 30.1 pg (ref 26.0–34.0)
MCHC: 29.3 g/dL — ABNORMAL LOW (ref 30.0–36.0)
MCV: 102.8 fL — ABNORMAL HIGH (ref 80.0–100.0)
Platelets: 380 10*3/uL (ref 150–400)
RBC: 2.82 MIL/uL — ABNORMAL LOW (ref 3.87–5.11)
RDW: 21 % — ABNORMAL HIGH (ref 11.5–15.5)
WBC: 14.2 10*3/uL — ABNORMAL HIGH (ref 4.0–10.5)
nRBC: 0.7 % — ABNORMAL HIGH (ref 0.0–0.2)

## 2018-02-25 LAB — PROTIME-INR
INR: 1.13
Prothrombin Time: 14.4 seconds (ref 11.4–15.2)

## 2018-02-25 LAB — MAGNESIUM: Magnesium: 2 mg/dL (ref 1.7–2.4)

## 2018-02-25 LAB — APTT: aPTT: 95 seconds — ABNORMAL HIGH (ref 24–36)

## 2018-02-25 LAB — HEPARIN LEVEL (UNFRACTIONATED): Heparin Unfractionated: 0.45 IU/mL (ref 0.30–0.70)

## 2018-02-25 SURGERY — AMPUTATION BELOW KNEE
Anesthesia: General | Laterality: Right

## 2018-02-25 MED ORDER — GLYCOPYRROLATE 0.2 MG/ML IJ SOLN
INTRAMUSCULAR | Status: DC | PRN
  Administered 2018-02-25: 0.2 mg via INTRAVENOUS

## 2018-02-25 MED ORDER — ACETAMINOPHEN 10 MG/ML IV SOLN
INTRAVENOUS | Status: AC
  Filled 2018-02-25: qty 100

## 2018-02-25 MED ORDER — SODIUM CHLORIDE 0.9 % IV SOLN
INTRAVENOUS | Status: DC | PRN
  Administered 2018-02-25: 10 ug/min via INTRAVENOUS

## 2018-02-25 MED ORDER — CEFAZOLIN SODIUM-DEXTROSE 2-4 GM/100ML-% IV SOLN
2.0000 g | INTRAVENOUS | Status: AC
  Administered 2018-02-25: 2 g via INTRAVENOUS
  Filled 2018-02-25: qty 100

## 2018-02-25 MED ORDER — PHENYLEPHRINE HCL 10 MG/ML IJ SOLN
INTRAMUSCULAR | Status: DC | PRN
  Administered 2018-02-25 (×4): 50 ug via INTRAVENOUS

## 2018-02-25 MED ORDER — ONDANSETRON HCL 4 MG/2ML IJ SOLN
INTRAMUSCULAR | Status: DC | PRN
  Administered 2018-02-25: 4 mg via INTRAVENOUS

## 2018-02-25 MED ORDER — HYDROMORPHONE HCL 1 MG/ML IJ SOLN
0.5000 mg | INTRAMUSCULAR | Status: DC | PRN
  Administered 2018-02-26: 0.5 mg via INTRAVENOUS
  Filled 2018-02-25: qty 0.5

## 2018-02-25 MED ORDER — ONDANSETRON HCL 4 MG/2ML IJ SOLN
INTRAMUSCULAR | Status: AC
  Filled 2018-02-25: qty 2

## 2018-02-25 MED ORDER — OXYCODONE-ACETAMINOPHEN 7.5-325 MG PO TABS: 1.0000 | ORAL_TABLET | Freq: Four times a day (QID) | ORAL | Status: DC | PRN

## 2018-02-25 MED ORDER — KETAMINE HCL 50 MG/ML IJ SOLN
INTRAMUSCULAR | Status: DC | PRN
  Administered 2018-02-25: 25 mg via INTRAMUSCULAR
  Administered 2018-02-25: 50 mg via INTRAMUSCULAR

## 2018-02-25 MED ORDER — BUPIVACAINE LIPOSOME 1.3 % IJ SUSP
INTRAMUSCULAR | Status: AC
  Filled 2018-02-25: qty 20

## 2018-02-25 MED ORDER — ONDANSETRON HCL 4 MG/2ML IJ SOLN: 4.0000 mg | Freq: Once | INTRAMUSCULAR | Status: DC | PRN

## 2018-02-25 MED ORDER — ACETAMINOPHEN 10 MG/ML IV SOLN
INTRAVENOUS | Status: DC | PRN
  Administered 2018-02-25: 1000 mg via INTRAVENOUS

## 2018-02-25 MED ORDER — LIDOCAINE HCL (CARDIAC) PF 100 MG/5ML IV SOSY
PREFILLED_SYRINGE | INTRAVENOUS | Status: DC | PRN
  Administered 2018-02-25: 60 mg via INTRAVENOUS

## 2018-02-25 MED ORDER — KETAMINE HCL 50 MG/ML IJ SOLN
INTRAMUSCULAR | Status: AC
  Filled 2018-02-25: qty 10

## 2018-02-25 MED ORDER — OXYCODONE-ACETAMINOPHEN 5-325 MG PO TABS
1.0000 | ORAL_TABLET | Freq: Four times a day (QID) | ORAL | Status: DC | PRN
  Administered 2018-02-25 – 2018-02-27 (×2): 1 via ORAL
  Filled 2018-02-25 (×2): qty 1

## 2018-02-25 MED ORDER — HYDROMORPHONE HCL 1 MG/ML IJ SOLN: 0.2500 mg | INTRAMUSCULAR | Status: DC | PRN

## 2018-02-25 MED ORDER — BUPIVACAINE LIPOSOME 1.3 % IJ SUSP
INTRAMUSCULAR | Status: DC | PRN
  Administered 2018-02-25: 50 mL

## 2018-02-25 MED ORDER — PROPOFOL 10 MG/ML IV BOLUS
INTRAVENOUS | Status: DC | PRN
  Administered 2018-02-25: 120 mg via INTRAVENOUS

## 2018-02-25 MED ORDER — BACITRACIN 500 UNIT/GM EX OINT
TOPICAL_OINTMENT | CUTANEOUS | Status: DC | PRN
  Administered 2018-02-25: 1 via TOPICAL

## 2018-02-25 SURGICAL SUPPLY — 67 items
"PENCIL ELECTRO HAND CTR " (MISCELLANEOUS) IMPLANT
BAG COUNTER SPONGE EZ (MISCELLANEOUS) ×3 IMPLANT
BANDAGE ELASTIC 4 LF NS (GAUZE/BANDAGES/DRESSINGS) ×3 IMPLANT
BLADE SAGITTAL WIDE XTHICK NO (BLADE) ×3 IMPLANT
BLADE SAW GIGLI 510 (BLADE) ×3 IMPLANT
BLADE SURG 15 STRL LF DISP TIS (BLADE) ×2 IMPLANT
BLADE SURG 15 STRL SS (BLADE) ×1
BLADE SURG SZ10 CARB STEEL (BLADE) ×3 IMPLANT
BNDG COHESIVE 4X5 TAN STRL (GAUZE/BANDAGES/DRESSINGS) ×3 IMPLANT
BNDG GAUZE 4.5X4.1 6PLY STRL (MISCELLANEOUS) ×3 IMPLANT
BRUSH SCRUB EZ  4% CHG (MISCELLANEOUS) ×1
BRUSH SCRUB EZ 4% CHG (MISCELLANEOUS) ×2 IMPLANT
CANISTER SUCT 1200ML W/VALVE (MISCELLANEOUS) ×3 IMPLANT
CHLORAPREP W/TINT 26ML (MISCELLANEOUS) ×3 IMPLANT
COVER WAND RF STERILE (DRAPES) ×3 IMPLANT
DERMABOND ADVANCED (GAUZE/BANDAGES/DRESSINGS) ×1
DERMABOND ADVANCED .7 DNX12 (GAUZE/BANDAGES/DRESSINGS) ×2 IMPLANT
DRAPE INCISE IOBAN 66X45 STRL (DRAPES) ×3 IMPLANT
DRAPE LAPAROTOMY 100X77 ABD (DRAPES) ×3 IMPLANT
DRSG GAUZE FLUFF 36X18 (GAUZE/BANDAGES/DRESSINGS) ×3 IMPLANT
ELECT CAUTERY BLADE 6.4 (BLADE) ×4 IMPLANT
ELECT REM PT RETURN 9FT ADLT (ELECTROSURGICAL) ×3
ELECTRODE REM PT RTRN 9FT ADLT (ELECTROSURGICAL) ×2 IMPLANT
GAUZE PETRO XEROFOAM 1X8 (MISCELLANEOUS) ×3 IMPLANT
GLOVE BIO SURGEON STRL SZ7 (GLOVE) ×3 IMPLANT
GLOVE INDICATOR 7.5 STRL GRN (GLOVE) ×3 IMPLANT
GLOVE SURG SYN 8.0 (GLOVE) ×3 IMPLANT
GLOVE SURG SYN 8.0 PF PI (GLOVE) ×2 IMPLANT
GOWN STRL REUS W/ TWL LRG LVL3 (GOWN DISPOSABLE) ×4 IMPLANT
GOWN STRL REUS W/ TWL XL LVL3 (GOWN DISPOSABLE) ×2 IMPLANT
GOWN STRL REUS W/TWL LRG LVL3 (GOWN DISPOSABLE) ×1
GOWN STRL REUS W/TWL XL LVL3 (GOWN DISPOSABLE) ×1
HANDLE YANKAUER SUCT BULB TIP (MISCELLANEOUS) ×4 IMPLANT
KIT TURNOVER KIT A (KITS) ×3 IMPLANT
LABEL OR SOLS (LABEL) ×3 IMPLANT
NDL HYPO 25X1 1.5 SAFETY (NEEDLE) ×2 IMPLANT
NEEDLE HYPO 25X1 1.5 SAFETY (NEEDLE) ×3 IMPLANT
NS IRRIG 500ML POUR BTL (IV SOLUTION) ×3 IMPLANT
PACK BASIN MINOR ARMC (MISCELLANEOUS) ×3 IMPLANT
PACK EXTREMITY ARMC (MISCELLANEOUS) ×3 IMPLANT
PAD ABD DERMACEA PRESS 5X9 (GAUZE/BANDAGES/DRESSINGS) ×3 IMPLANT
PAD PREP 24X41 OB/GYN DISP (PERSONAL CARE ITEMS) ×3 IMPLANT
PENCIL ELECTRO HAND CTR (MISCELLANEOUS) ×3 IMPLANT
SPONGE GAUZE 2X2 8PLY STRL LF (GAUZE/BANDAGES/DRESSINGS) ×3 IMPLANT
SPONGE LAP 18X18 RF (DISPOSABLE) ×6 IMPLANT
STAPLER SKIN PROX 35W (STAPLE) ×3 IMPLANT
STOCKINETTE M/LG 89821 (MISCELLANEOUS) ×3 IMPLANT
SUT MNCRL 4-0 (SUTURE) ×2
SUT MNCRL 4-0 27XMFL (SUTURE) ×4
SUT MNCRL+ 5-0 UNDYED PC-3 (SUTURE) ×2 IMPLANT
SUT MONOCRYL 5-0 (SUTURE)
SUT SILK 2 0 (SUTURE) ×1
SUT SILK 2-0 18XBRD TIE 12 (SUTURE) ×2 IMPLANT
SUT SILK 3 0 (SUTURE) ×1
SUT SILK 3-0 18XBRD TIE 12 (SUTURE) ×2 IMPLANT
SUT VIC AB 0 CT1 36 (SUTURE) ×11 IMPLANT
SUT VIC AB 0 CT2 27 (SUTURE) ×2 IMPLANT
SUT VIC AB 3-0 SH 27 (SUTURE) ×2
SUT VIC AB 3-0 SH 27X BRD (SUTURE) ×4 IMPLANT
SUT VICRYL 0 AB UR-6 (SUTURE) ×1 IMPLANT
SUT VICRYL PLUS ABS 0 54 (SUTURE) ×5 IMPLANT
SUTURE MNCRL 4-0 27XMF (SUTURE) IMPLANT
SYR 10ML LL (SYRINGE) ×2 IMPLANT
SYR 20CC LL (SYRINGE) ×2 IMPLANT
TAPE UMBIL 1/8X18 RADIOPA (MISCELLANEOUS) ×3 IMPLANT
TOWEL OR 17X26 4PK STRL BLUE (TOWEL DISPOSABLE) ×4 IMPLANT
TUBING CONNECTING 10 (TUBING) ×1 IMPLANT

## 2018-02-25 NOTE — Progress Notes (Signed)
ANTICOAGULATION CONSULT NOTE - Follow up [place] for Heparin Drip Indication: VTE treatment  Patient Measurements: Height: 5' 4"  (162.6 cm) Weight: 141 lb 1.5 oz (64 kg) IBW/kg (Calculated) : 54.7  Vital Signs: Temp: 97.8 F (36.6 C) (11/06 0510) Temp Source: Oral (11/06 0510) BP: 156/77 (11/06 0510) Pulse Rate: 62 (11/06 0510)  Labs: Recent Labs    02/23/18 0615  02/24/18 0239 02/24/18 1004 02/24/18 1934 02/25/18 0530  HGB 8.0*  --  8.0*  --   --  8.5*  HCT 27.3*  --  27.3*  --   --  29.0*  PLT 365  --  369  --   --  380  APTT  --   --   --   --   --  95*  LABPROT  --   --   --   --   --  14.4  INR  --   --   --   --   --  1.13  HEPARINUNFRC 0.25*   < > 0.29* 0.36 0.45 0.45  CREATININE  --   --  7.00*  --   --  4.18*   < > = values in this interval not displayed.    Medical History: Past Medical History:  Diagnosis Date  . (HFpEF) heart failure with preserved ejection fraction ([place])    a. 01/2018 Echo: EF 50-55%, no rwma, Gr1 DD, triv AI, mild MR. Midly dil LA/RV, mod reduced RV fxn. Irreg thickening of TV w/ mobile echodensity that appears to arise from valve.  . Anemia   . Anorexia   . Bacteremia due to Pseudomonas   . Carotid arterial disease ([place])    a. 01/2017 Carotid U/S: RICA <32, LICA <44.  Marland Kitchen Complication of anesthesia    a. Pt reports h/o complication on 9 different occasions - ? hypotension/arrest.  . 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 01/2015 Cath [facility]): 70% LM, p-mLAD 50-60% (Resting FFR 0.75), mRCA 80-90%, ~40 Ost OM & D1-->CABG; b. 01/2016 Staged PCI of LCX x 2 and RCA.  Marland Kitchen ESRD (end stage renal disease) on dialysis ([place])    a. ESRD secondary to acute kidney failure s/p CABG-->PD  . Essential hypertension   . GERD (gastroesophageal reflux disease)   . Heart murmur   . Hypercholesterolemia   . Myocardial infarction ([place]) 2016  . PVD (peripheral vascular disease) ([place])    a. 01/30/2018 PV  Angio: Sev Left Popliteal dzs s/p PTA w/ 24m lutonix DEB w/ mech aspiration of the L popliteal, L AT, and Left tibioperoneal trunck and peroneal artery.  . 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  [facility]: Dr. BMarland KitchenHaithcock: CABG x 3, LIMA to LAD, SVG to RCA, SVG to OM3, EVH  . Sinusitis 2019  . Type II diabetes mellitus with complication (HCC)    CAD    Medications:  Scheduled:  . aspirin EC  81 mg Oral Daily  . atorvastatin  80 mg Oral QHS  . B-complex with vitamin C  1 tablet Oral Daily  . carvedilol  3.125 mg Oral BID WC  . Chlorhexidine Gluconate Cloth  6 each Topical Q0600  . docusate sodium  100 mg Oral BID  . epoetin (EPOGEN/PROCRIT) injection  10,000 Units Intravenous Q T,Th,Sa-HD  . famotidine  10 mg Oral Daily  . feeding supplement (ENSURE ENLIVE)  237 mL Oral BID BM  . folic acid  1 mg Oral Daily  .  insulin aspart  0-5 Units Subcutaneous QHS  . insulin aspart  0-9 Units Subcutaneous TID WC  . insulin glargine  4 Units Subcutaneous QHS  . isosorbide mononitrate  30 mg Oral Daily  . midodrine  10 mg Oral Q T,Th,Sa-HD  . mirtazapine  7.5 mg Oral QHS  . multivitamin with minerals  1 tablet Oral Daily  . multivitamin-lutein  1 capsule Oral Daily  . olopatadine  1 drop Both Eyes BID  . omega-3 acid ethyl esters  1 capsule Oral Daily  . vitamin B-12  1,000 mcg Oral Daily   Infusions:  . sodium chloride Stopped (02/22/18 1826)  .  ceFAZolin (ANCEF) IV    . ceFEPime (MAXIPIME) IV Stopped (02/24/18 1813)  . heparin 1,250 Units/hr (02/25/18 0450)  . vancomycin      Assessment: Pharmacy consulted to dose and monitor heparin drip for this 67 yo female presenting to ED with limb threatening ischemia bilateral legs. Baseline troponin 0.11.  Vascular surgery procedure 10/31. She was on apixaban PTA. Baseline aPTT 45s, no baseline heparin level drawn.  She refuses all lab draws except for those done during HD sessions. This morning she was taken by vascular for an angioplasty and the  infusion ran during the entire procedure. She is scheduled for HD this evening, therefore, aPTT and HL will be ordered with this session's labs.   Goal of Therapy:  Heparin level 0.3-0.7 units/ml Monitor platelets by anticoagulation protocol: Yes   Plan:  11/06 @ 0530 Heparin level 0.45 therapeutic.   Will continue current rate of 1250 units/hr and recheck heparin level and CBC with AM labs.    Tobie Lords, PharmD, BCPS Clinical Pharmacist 02/25/2018 7:00 AM

## 2018-02-25 NOTE — Progress Notes (Signed)
Per MD okay for RN to stop heparin drip. Pt will have a BKA this afternoon.

## 2018-02-25 NOTE — H&P (Signed)
[facility] History & Physical Update  The patient was interviewed and re-examined.  The patient's previous History and Physical has been reviewed and is unchanged.  There is no change in the plan of care. We plan to proceed with the scheduled procedure.  Hortencia Pilar, MD  02/25/2018, 11:06 AM

## 2018-02-25 NOTE — Transfer of Care (Signed)
Immediate Anesthesia Transfer of Care Note  Patient: Stephanie Ellis  Procedure(s) Performed: AMPUTATION BELOW KNEE (Right ) REMOVAL OF A DIALYSIS CATHETER (N/A )  Patient Location: PACU  Anesthesia Type:General  Level of Consciousness: drowsy and responds to stimulation  Airway & Oxygen Therapy: Patient Spontanous Breathing and Patient connected to face mask oxygen  Post-op Assessment: Report given to RN and Post -op Vital signs reviewed and stable  Post vital signs: Reviewed and stable  Last Vitals:  Vitals Value Taken Time  BP 153/72 02/25/2018  1:33 PM  Temp    Pulse 58 02/25/2018  1:33 PM  Resp 0 02/25/2018  1:33 PM  SpO2 100 % 02/25/2018  1:33 PM  Vitals shown include unvalidated device data.  Last Pain:  Vitals:   02/25/18 1107  TempSrc: Temporal  PainSc: 0-No pain      Patients Stated Pain Goal: 0 (12/52/47 9980)  Complications: No apparent anesthesia complications

## 2018-02-25 NOTE — Progress Notes (Signed)
   02/25/18 1810  Clinical Encounter Type  Visited With Patient and family together  Visit Type Follow-up  Referral From Chaplain  Spiritual Encounters  Spiritual Needs Emotional   Based on recommendation of chaplain Saverio Danker, this chaplain followed up with patient and family.  Patient smiled and expressed gratitude for earlier chaplain visits.  She asked this chaplain to relay that she "was well," she "was blessed," and she had "made it into the light."  Conversation regarding role of faith and God through her concerns and to present state.  Patient planning to rest tonight.  Chaplain encouraged her to reach out as needed.

## 2018-02-25 NOTE — Op Note (Signed)
OPERATIVE NOTE   PROCEDURE: 1.  Right below-the-knee amputation 2.  Removal peritoneal dialysis catheter  PRE-OPERATIVE DIAGNOSIS: Right foot gangrene; complication of dialysis device with nonfunction of peritoneal catheter; end-stage renal disease on hemodialysis  POST-OPERATIVE DIAGNOSIS: same as above  SURGEON: Hortencia Pilar, MD  ASSISTANT(S): None  ANESTHESIA: general  ESTIMATED BLOOD LOSS: 100 cc  FINDING(S): Gangrene right foot; PD catheter intact  SPECIMEN(S):  Right below-the-knee amputation to pathology PD catheter is not sent  INDICATIONS:   Anderson Coppock is a 67 y.o. female who presents with right leg gangrene.  The patient is scheduled for a right below-the-knee amputation.  I discussed in depth with the patient the risks, benefits, and alternatives to this procedure.  The patient is aware that the risk of this operation included but are not limited to:  bleeding, infection, myocardial infarction, stroke, death, failure to heal amputation wound, and possible need for more proximal amputation.  The patient is aware of the risks and agrees proceed forward with the procedure.  DESCRIPTION:  After full informed written consent was obtained from the patient, the patient was brought back to the operating room, and placed supine upon the operating table.  Prior to induction, the patient received IV antibiotics.  The patient was then prepped and draped in a sterile fashion for removal of PD catheter.  Appropriate timeout is called.  Linear incision is made through the previous scar and the dissection is carried down to the fascia where the cuff is exposed.  Is dissected free from the surrounding tissues.  A pursestring suture of 0 Vicryl was then placed in the fascia and the PD catheter was removed from the abdominal cavity.  It is intact.  The pursestring suture is secured.  The second cuff is then dissected free quite easily.  The intact catheter is then transected so that  it can be removed.  The abdominal incision is irrigated and then closed in layers using 3-0 Vicryl interrupted sutures followed by 4-0 Monocryl subcuticular and Dermabond.  Antibiotic ointment and a dressing is placed at the exit site.  The patient was then reprepped and draped in the standard fashion for a below-the-knee amputation.  I marked out the anterior incision 1 palm breath  below the inferior margin of the patella and then the marked out a posterior flap that was one third of the circumference of the calf in length.   I made the incisions for these flaps, and then dissected through the subcutaneous tissue, fascia, and muscle anteriorly.  I elevated  the periosteal tissue superiorly so that the tibia was about 3-4 cm shorter than the anterior skin flap.  I then transected the tibia with a power saw and then took a wedge off the tibia anteriorly with the power saw.  Then I smoothed out the rough edges.  In a similar fashion, I cut back the fibula about two centimeters higher than the level of the tibia with a bone cutter.  I put a bone hook into the distal tibia and then used a large amputation knife to sharply develop a tissue plane through the muscle along the fibula.  In such fashion, the posterior flap was developed.  At this point, the specimen was passed off the field as the below-the-knee amputation.  At this point, I clamped all visibly bleeding arteries and veins using a combination of suture ligation with Silk suture and electrocautery.  Bleeding continued to be controlled with electrocautery and suture ligature.  The stump was  washed off with sterile normal saline and no further active bleeding was noted.  I reapproximated the anterior and posterior fascia  with interrupted stitches of 0 Vicryl.  This was completed along the entire length of anterior and posterior fascia until there were no more loose space in the fascial line. I then placed a layer of 2-0 Vicryl sutures in the subcutaneous  tissue. The skin was then  reapproximated with staples.  The stump was washed off and dried.  The incision was dressed with Xeroform and  then fluffs were applied.  Kerlix was wrapped around the leg and then gently an ACE wrap was applied.    COMPLICATIONS: none  CONDITION: stable   Hortencia Pilar  02/25/2018, 1:32 PM    This note was created with Dragon Medical transcription system. Any errors in dictation are purely unintentional.

## 2018-02-25 NOTE — Anesthesia Post-op Follow-up Note (Signed)
Anesthesia QCDR form completed.        

## 2018-02-25 NOTE — Progress Notes (Signed)
RN paged Dr. Delana Meyer to ask if the heparin drip  was going to be continue after surgery. He stated he will consult with cardiology about it.  Will continue to assess and monitor pt.

## 2018-02-25 NOTE — Anesthesia Preprocedure Evaluation (Signed)
Anesthesia Evaluation  Patient identified by MRN, date of birth, ID band Patient awake    Reviewed: Allergy & Precautions, NPO status , Patient's Chart, lab work & pertinent test results, reviewed documented beta blocker date and time   History of Anesthesia Complications (+) PROLONGED EMERGENCE and history of anesthetic complications  Airway Mallampati: II  TM Distance: >3 FB     Dental  (+) Chipped, Dental Advidsory Given   Pulmonary neg pulmonary ROS,           Cardiovascular hypertension, Pt. on medications and Pt. on home beta blockers + angina + CAD, + Past MI, + Cardiac Stents, + Peripheral Vascular Disease and +CHF  + dysrhythmias + Valvular Problems/Murmurs      Neuro/Psych  Neuromuscular disease    GI/Hepatic GERD  Controlled,  Endo/Other  diabetes, Type 2  Renal/GU ESRF and DialysisRenal disease     Musculoskeletal   Abdominal   Peds negative pediatric ROS (+)  Hematology  (+) Blood dyscrasia, anemia ,   Anesthesia Other Findings Past Medical History: No date: (HFpEF) heart failure with preserved ejection fraction ([place])     Comment:  a. 01/2018 Echo: EF 50-55%, no rwma, Gr1 DD, triv AI,               mild MR. Midly dil LA/RV, mod reduced RV fxn. Irreg               thickening of TV w/ mobile echodensity that appears to               arise from valve. No date: Anemia No date: Anorexia No date: Bacteremia due to Pseudomonas No date: Carotid arterial disease ([place])     Comment:  a. 01/2017 Carotid U/S: RICA <70, LICA <62. No date: Complication of anesthesia     Comment:  a. Pt reports h/o complication on 9 different occasions               - ? hypotension/arrest. 01/2015: Coronary artery disease involving left main coronary artery     Comment:  a. 01/2015 Cath [facility]): 70% LM, p-mLAD 50-60% (Resting               FFR 0.75), mRCA 80-90%, ~40 Ost OM & D1-->CABG; b.               01/2016 Staged PCI of LCX  x 2 and RCA. No date: ESRD (end stage renal disease) on dialysis [facility])     Comment:  a. ESRD secondary to acute kidney failure s/p CABG-->PD No date: Essential hypertension No date: GERD (gastroesophageal reflux disease) No date: Heart murmur No date: Hypercholesterolemia 2016: Myocardial infarction ([place]) No date: PVD (peripheral vascular disease) ([place])     Comment:  a. 01/30/2018 PV Angio: Sev Left Popliteal dzs s/p PTA               w/ 31m lutonix DEB w/ mech aspiration of the L popliteal,              L AT, and Left tibioperoneal trunck and peroneal artery. 03/24/2015: S/P CABG x 3     Comment:  a.  [facility]: Dr. BMarland KitchenHaithcock: CABG x 3, LIMA to LAD,              SVG to RCA, [facility] No date: Type II diabetes mellitus with complication ([place]     Comment:  CAD  Reproductive/Obstetrics negative OB ROS  Anesthesia Physical  Anesthesia Plan  ASA: IV  Anesthesia Plan: General   Post-op Pain Management:    Induction: Intravenous  PONV Risk Score and Plan: 3 and Ondansetron, Dexamethasone and Treatment may vary due to age or medical condition  Airway Management Planned: LMA and Oral ETT  Additional Equipment:   Intra-op Plan:   Post-operative Plan: Extubation in OR  Informed Consent: I have reviewed the patients History and Physical, chart, labs and discussed the procedure including the risks, benefits and alternatives for the proposed anesthesia with the patient or authorized representative who has indicated his/her understanding and acceptance.   Dental Advisory Given  Plan Discussed with: CRNA  Anesthesia Plan Comments:         Anesthesia Quick Evaluation

## 2018-02-25 NOTE — Progress Notes (Signed)
[place] at [place] NAME: Yovana Scogin    MR#:  [id number]  DATE OF BIRTH:  [birth year]  SUBJECTIVE:  Left eye itching and watery-- does have some blurred vision. Daughter in the room states she gets shots in her left eye at [facility] and has missed it since she's been in the hospital patient anxious about surgery REVIEW OF SYSTEMS:  Review of Systems  Constitutional: Negative for chills and fever.  HENT: Negative for congestion and sore throat.   Eyes: Negative for blurred vision and double vision.  Respiratory: Negative for cough and shortness of breath.   Cardiovascular: Negative for chest pain, palpitations and leg swelling.  Gastrointestinal: Negative for abdominal pain, nausea and vomiting.  Genitourinary: Negative for dysuria and frequency.  Musculoskeletal: Negative for back pain and neck pain.  Neurological: Negative for dizziness and headaches.  Psychiatric/Behavioral: Negative for depression. The patient is not nervous/anxious.     DRUG ALLERGIES:   Allergies  Allergen Reactions  . Chlorthalidone Anaphylaxis, Itching and Rash  . Fentanyl Rash  . Midazolam Rash  . Ace Inhibitors Other (See Comments)    Reaction:  Hyperkalemia, agitation   . Angiotensin Receptor Blockers Other (See Comments)    Reaction:  Hyperkalemia, agitation   . Norvasc [Amlodipine] Itching and Rash  . Phenergan [Promethazine Hcl] Anxiety    "antsy, can't sit still"   VITALS:  Blood pressure (!) 156/77, pulse 62, temperature 97.8 F (36.6 C), temperature source Oral, resp. rate 18, height 5' 4"  (1.626 m), weight 64 kg, SpO2 96 %. PHYSICAL EXAMINATION:  Physical Exam  GENERAL:  67 y.o.-year-old patient lying in the bed with no acute distress.  EYES: Pupils equal, round, reactive to light and accommodation. No scleral icterus. Extraocular muscles intact.  HEENT: Head atraumatic, normocephalic. Oropharynx and nasopharynx clear. Left eye  watery and congestion NECK:  Supple, no jugular venous distention. No thyroid enlargement, no tenderness.  LUNGS: Normal breath sounds bilaterally, no wheezing, rales,rhonchi or crepitation. No use of accessory muscles of respiration.  CARDIOVASCULAR: RRR, S1, S2 normal. No murmurs, rubs, or gallops.  ABDOMEN: Soft, nontender, nondistended. Bowel sounds present. No organomegaly or mass.  EXTREMITIES: +weak pulse on right foot, right foot with necrosis of all 5 toes extending up the plantar surface. +left great toe amputation with some dorsal necrosis present. No foul odor NEUROLOGIC: Cranial nerves II through XII are intact. gen weakness Sensation intact. Gait not checked.  PSYCHIATRIC: patient is alert and oriented x 3.  SKIN: see above LABORATORY PANEL:  Female CBC Recent Labs  Lab 02/25/18 0530  WBC 14.2*  HGB 8.5*  HCT 29.0*  PLT 380   ------------------------------------------------------------------------------------------------------------------ Chemistries  Recent Labs  Lab 02/25/18 0530  NA 142  K 4.5  CL 99  CO2 33*  GLUCOSE 214*  BUN 24*  CREATININE 4.18*  CALCIUM 8.6*  MG 2.0   RADIOLOGY:  No results found. ASSESSMENT AND PLAN:  Jessicia Napolitano  is a 67 y.o. 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  She was sent out to rehab on Eliquis.  She was brought back in with ischemia of the right foot and toes and also ischemia on the left foot and toe.  *Bilateral lower extremity gangrene- s/p angiogram of RLE this morning with stents placed, also s/p left great toe amputation at last hospitalization. -Vascular and podiatry following -Anticipate that patient will  Need ? bilateral amputations per Dr Lucky Cowboy -on IV  vancomycin and cefepime  - holding heparin drip planned amputation right foot  *Recent history of endocarditis- s/p septic embolization treated with  revascularization -Continue antibiotics as above  *ESRD on HD TTS -Nephrology consulted -She would like her perm cath be removed  *T2DM- blood sugars well-controlled -Continue lantus and SSI  * Elevated troponin with history of CAD s/p CABG- stable, no active chest pain. Troponin likely elevated due to reduced renal clearance. -Continue aspirin -On heparin drip  * Chronic diastolic CHF- no signs of volume overload -fluid management per nephro  *Staph aureus bacteremia. Has tricuspid endocarditis Will be getting  IV Cefazolin until 03/13/18 ( currently on vanco/cefepime instead) Pt has perm cath   *Left eye congestion with blurred vision -with daughter to call [facility] to see if patient is able to get that shot in the eye by ophthalmologist then will place ophthalmologist consult.  All the records are reviewed and case discussed with Care Management/Social Worker. Management plans discussed with the patient, family and they are in agreement.  CODE STATUS: Full Code  TOTAL TIME TAKING CARE OF THIS PATIENT: 25 minutes.   More than 50% of the time was spent in counseling/coordination of care: YES  POSSIBLE D/C in ?? DAYS, DEPENDING ON CLINICAL CONDITION.   Fritzi Mandes M.D on 02/25/2018 at 8:59 AM  Between 7am to 6pm - Pager - [phone]  After 6pm go to [email or web address] - Proofreader  [facility]  Office  [phone]  CC: Primary care physician; System, Pcp Not In  Note: This dictation was prepared with Dragon dictation along with smaller phrase technology. Any transcriptional errors that result from this process are unintentional.

## 2018-02-25 NOTE — Consult Note (Signed)
  I spoke with Dr. Fritzi Mandes, who consulted ophthalmology. Patient has an itchy eye with watering, and has a history of allergies.  She also has advanced diabetes, a history of retinopathy and neovascular glaucoma for which she receives injections of medication at [facility].  We discussed that starting Zaditor BID OU could improve patient's symptoms of itching and watering (which may be due to allergic conjunctivitis or viral conjunctivitis).  Patient's retinopathy does need further monitoring and (likely) additional eye injections, but these should be performed on an outpatient basis.  We encouraged Dr. Posey Pronto to tell the patient's family to schedule a visit to our eye clinic when the patient is discharged from the hospital.  Dr. Posey Pronto will contact ophthalmology if she has further questions or concerns.  Marchia Meiers MD

## 2018-02-25 NOTE — Anesthesia Procedure Notes (Signed)
Procedure Name: LMA Insertion Performed by: Marwan Lipe, CRNA Pre-anesthesia Checklist: Patient identified, Patient being monitored, Timeout performed, Emergency Drugs available and Suction available Patient Re-evaluated:Patient Re-evaluated prior to induction Oxygen Delivery Method: Circle system utilized Preoxygenation: Pre-oxygenation with 100% oxygen Induction Type: IV induction Ventilation: Mask ventilation without difficulty LMA: LMA inserted LMA Size: 3.5 Tube type: Oral Number of attempts: 1 Placement Confirmation: positive ETCO2 and breath sounds checked- equal and bilateral Tube secured with: Tape Dental Injury: Teeth and Oropharynx as per pre-operative assessment        

## 2018-02-26 ENCOUNTER — Encounter: Payer: Self-pay | Admitting: Vascular Surgery

## 2018-02-26 DIAGNOSIS — Z89511 Acquired absence of right leg below knee: Secondary | ICD-10-CM

## 2018-02-26 LAB — GLUCOSE, CAPILLARY
Glucose-Capillary: 59 mg/dL — ABNORMAL LOW (ref 70–99)
Glucose-Capillary: 64 mg/dL — ABNORMAL LOW (ref 70–99)
Glucose-Capillary: 68 mg/dL — ABNORMAL LOW (ref 70–99)
Glucose-Capillary: 73 mg/dL (ref 70–99)
Glucose-Capillary: 115 mg/dL — ABNORMAL HIGH (ref 70–99)
Glucose-Capillary: 136 mg/dL — ABNORMAL HIGH (ref 70–99)
Glucose-Capillary: 157 mg/dL — ABNORMAL HIGH (ref 70–99)
Glucose-Capillary: 177 mg/dL — ABNORMAL HIGH (ref 70–99)
Glucose-Capillary: 151 mg/dL — ABNORMAL HIGH (ref 70–99)

## 2018-02-26 LAB — CBC
HCT: 23.8 % — ABNORMAL LOW (ref 36.0–46.0)
Hemoglobin: 6.9 g/dL — ABNORMAL LOW (ref 12.0–15.0)
MCH: 31.1 pg (ref 26.0–34.0)
MCHC: 29 g/dL — ABNORMAL LOW (ref 30.0–36.0)
MCV: 107.2 fL — ABNORMAL HIGH (ref 80.0–100.0)
Platelets: 338 10*3/uL (ref 150–400)
RBC: 2.22 MIL/uL — ABNORMAL LOW (ref 3.87–5.11)
RDW: 21.5 % — ABNORMAL HIGH (ref 11.5–15.5)
WBC: 17.4 10*3/uL — ABNORMAL HIGH (ref 4.0–10.5)
nRBC: 1.1 % — ABNORMAL HIGH (ref 0.0–0.2)

## 2018-02-26 LAB — VANCOMYCIN, RANDOM: Vancomycin Rm: 12

## 2018-02-26 MED ORDER — SODIUM CHLORIDE 0.9% IV SOLUTION: Freq: Once | INTRAVENOUS | Status: DC

## 2018-02-26 MED ORDER — CEFAZOLIN SODIUM-DEXTROSE 1-4 GM/50ML-% IV SOLN
1.0000 g | INTRAVENOUS | Status: DC
  Administered 2018-02-26 – 2018-02-27 (×2): 1 g via INTRAVENOUS
  Filled 2018-02-26 (×3): qty 50

## 2018-02-26 MED ORDER — ACETAMINOPHEN 325 MG PO TABS
650.0000 mg | ORAL_TABLET | Freq: Once | ORAL | Status: AC
  Administered 2018-02-26: 650 mg via ORAL

## 2018-02-26 NOTE — Progress Notes (Signed)
Patient vomiting. Primary RN to bedside to medicate.

## 2018-02-26 NOTE — Progress Notes (Signed)
Per MD okay for RN to order one time dose for PO tylenol 650 mg.

## 2018-02-26 NOTE — Progress Notes (Signed)
Patient hypotensive, Midodrine given. UF OFF. Saline bolus given. Very sleepy and less responsive.

## 2018-02-26 NOTE — Progress Notes (Signed)
Per Dr. Posey Pronto okay for RN to D/C iv dilaudid.

## 2018-02-26 NOTE — Clinical Social Work Note (Signed)
CSW informed by MD that patient will discharge likely tomorrow or Saturday back to [facility]. CSW has notified Magda Paganini at [facility]. Shela Leff MSW,LCSW [phone]

## 2018-02-26 NOTE — Progress Notes (Signed)
[facility]  Daily Progress Note   Subjective: 1 Day Post-Op: Right below-the-knee amputation with removal peritoneal dialysis catheter  Patient seen during dialysis.  Lethargic.  No issues overnight.   Objective: Vitals:   02/26/18 1116 02/26/18 1118 02/26/18 1128 02/26/18 1130  BP:  (!) 140/55 (!) 149/54 (!) 148/50  Pulse: 67  68 68  Resp:  15 16 16   Temp:  97.7 F (36.5 C) 97.7 F (36.5 C)   TempSrc:  Axillary Axillary   SpO2:   98%   Weight:      Height:        Intake/Output Summary (Last 24 hours) at 02/26/2018 1150 Last data filed at 02/25/2018 1748 Gross per 24 hour  Intake 300 ml  Output 50 ml  Net 250 ml   Physical Exam: NAD CV: RRR Pulmonary: CTA Bilaterally Abdomen: Soft, Nontender, Nondistended, (+) Bowel Sounds. OR dressings / Incisions: clean, dry and intact Vascular:  Right lower extremity: Thigh soft, calf soft. OR dressing clean, dry and intact   Laboratory: CBC    Component Value Date/Time   WBC 17.4 (H) 02/26/2018 0843   HGB 6.9 (L) 02/26/2018 0843   HCT 23.8 (L) 02/26/2018 0843   PLT 338 02/26/2018 0843   BMET    Component Value Date/Time   NA 142 02/25/2018 0530   K 4.5 02/25/2018 0530   CL 99 02/25/2018 0530   CO2 33 (H) 02/25/2018 0530   GLUCOSE 214 (H) 02/25/2018 0530   BUN 24 (H) 02/25/2018 0530   CREATININE 4.18 (H) 02/25/2018 0530   CALCIUM 8.6 (L) 02/25/2018 0530   GFRNONAA 10 (L) 02/25/2018 0530   GFRAA 12 (L) 02/25/2018 0530   Assessment/Planning: 67 year old female with a past medical history of severe peripheral artery disease and end-stage renal disease status post right below the knee amputation for gangrene and removal of her peritoneal dialysis catheter postop day 1 - Stable 1) patient to continue to dialyze through her PermCath at this time.  Patient will need a permanent dialysis access in the future. 2) patient will need physical therapy and occupational therapy and the recommendations for most  likely rehab on discharge.  Will consult physical therapy, occupational therapy and social work 3) we will change OR dressing postop day 4/5 (Saturday or Sunday)  Discussed with Dr. Eber Hong Tao Satz PA-C 02/26/2018 11:50 AM

## 2018-02-26 NOTE — Progress Notes (Signed)
[facility]  ROUNDING NOTE   Subjective:   Patient is doing fair No acute complaints Seen during HD.    HEMODIALYSIS FLOWSHEET:  Blood Flow Rate (mL/min): 400 mL/min Arterial Pressure (mmHg): -150 mmHg Venous Pressure (mmHg): 160 mmHg Transmembrane Pressure (mmHg): 60 mmHg Ultrafiltration Rate (mL/min): 600 mL/min Dialysate Flow Rate (mL/min): 600 ml/min Conductivity: Machine : 14 Conductivity: Machine : 14 Dialysis Fluid Bolus: Blood Products Bolus Amount (mL): 350 mL Dialysate Change: 2K  Patient experienced complications of nausea vomiting and decreased blood pressure during dialysis today Given IV fluid boluses Given blood transfusion during dialysis  Objective:  Vital signs in last 24 hours:  Temp:  [97.5 F (36.4 C)-98.7 F (37.1 C)] 98.7 F (37.1 C) (11/07 1228) Pulse Rate:  [56-80] 69 (11/07 1228) Resp:  [6-18] 16 (11/07 1228) BP: (81-150)/(39-66) 132/62 (11/07 1228) SpO2:  [83 %-98 %] 98 % (11/07 1228) Weight:  [63.1 kg-63.3 kg] 63.3 kg (11/07 1138)  Weight change: -1 kg Filed Weights   02/25/18 1107 02/26/18 0905 02/26/18 1138  Weight: 64 kg 63.1 kg 63.3 kg    Intake/Output: I/O last 3 completed shifts: In: 437.2 [I.V.:331.7; IV Piggyback:105.5] Out: 50 [Blood:50]   Intake/Output this shift:  Total I/O In: -  Out: -494   Physical Exam: General: NAD  Head: Normocephalic, atraumatic. Moist oral mucosal membranes  Eyes: Anicteric,   Neck: Supple, trachea midline  Lungs:  Clear to auscultation  Heart: Regular rate and rhythm  Abdomen:  Soft, nontender  Extremities:  left foot wound, right BKA  Neurologic:  Alert and oriented  Skin: No lesions  Access: PD catheter, left IJ tunneled catheter    Basic Metabolic Panel: Recent Labs  Lab 02/20/18 0340 02/21/18 0927 02/24/18 0239 02/25/18 0530  NA 137 139 141 142  K 4.2 4.7 5.0 4.5  CL 100 100 103 99  CO2 27 27 29  33*  GLUCOSE 156* 123* 156* 214*  BUN 16 26* 43* 24*   CREATININE 3.51* 5.56* 7.00* 4.18*  CALCIUM 7.8* 8.5* 8.8* 8.6*  MG  --   --   --  2.0  PHOS 2.2* 4.1 2.2*  --     Liver Function Tests: Recent Labs  Lab 02/20/18 0340 02/21/18 0927 02/24/18 0239  ALBUMIN 1.7* 1.8* 1.9*   No results for input(s): LIPASE, AMYLASE in the last 168 hours. No results for input(s): AMMONIA in the last 168 hours.  CBC: Recent Labs  Lab 02/22/18 0620 02/23/18 0615 02/24/18 0239 02/25/18 0530 02/26/18 0843  WBC 16.4* 16.6* 14.9* 14.2* 17.4*  HGB 8.1* 8.0* 8.0* 8.5* 6.9*  HCT 27.6* 27.3* 27.3* 29.0* 23.8*  MCV 101.8* 103.4* 104.2* 102.8* 107.2*  PLT 319 365 369 380 338    Cardiac Enzymes: No results for input(s): CKTOTAL, CKMB, CKMBINDEX, TROPONINI in the last 168 hours.  BNP: Invalid input(s): POCBNP  CBG: Recent Labs  Lab 02/26/18 0800 02/26/18 0802 02/26/18 0955 02/26/18 1048 02/26/18 1228  GLUCAP 64* 73 115* 136* 157*    Microbiology: Results for orders placed or performed during the hospital encounter of 02/18/18  Blood Culture (routine x 2)     Status: None   Collection Time: 02/18/18  7:55 AM  Result Value Ref Range Status   Specimen Description BLOOD RIGHT [facility]  Final   Special Requests   Final    BOTTLES DRAWN AEROBIC AND ANAEROBIC Blood Culture results may not be optimal due to an excessive volume of blood received in culture bottles   Culture  Final    NO GROWTH 5 DAYS Performed at [facility], [address] [place], [place] [address]    Report Status 02/23/2018 FINAL  Final  Blood Culture (routine x 2)     Status: None   Collection Time: 02/18/18  7:55 AM  Result Value Ref Range Status   Specimen Description BLOOD RIGHT FA  Final   Special Requests   Final    BOTTLES DRAWN AEROBIC AND ANAEROBIC Blood Culture results may not be optimal due to an excessive volume of blood received in culture bottles   Culture   Final    NO GROWTH 5 DAYS Performed at [facility], [address],  [place], [place] [address]    Report Status 02/23/2018 FINAL  Final    Coagulation Studies: Recent Labs    02/25/18 0530  LABPROT 14.4  INR 1.13    Urinalysis: No results for input(s): COLORURINE, LABSPEC, PHURINE, GLUCOSEU, HGBUR, BILIRUBINUR, KETONESUR, PROTEINUR, UROBILINOGEN, NITRITE, LEUKOCYTESUR in the last 72 hours.  Invalid input(s): APPERANCEUR    Imaging: No results found.   Medications:   . sodium chloride 0 mL/hr at 02/22/18 1826  .  ceFAZolin (ANCEF) IV     . sodium chloride   Intravenous Once  . aspirin EC  81 mg Oral Daily  . atorvastatin  80 mg Oral QHS  . B-complex with vitamin C  1 tablet Oral Daily  . carvedilol  3.125 mg Oral BID WC  . docusate sodium  100 mg Oral BID  . epoetin (EPOGEN/PROCRIT) injection  10,000 Units Intravenous Q T,Th,Sa-HD  . famotidine  10 mg Oral Daily  . feeding supplement (ENSURE ENLIVE)  237 mL Oral BID BM  . folic acid  1 mg Oral Daily  . insulin aspart  0-5 Units Subcutaneous QHS  . insulin aspart  0-9 Units Subcutaneous TID WC  . insulin glargine  4 Units Subcutaneous QHS  . isosorbide mononitrate  30 mg Oral Daily  . midodrine  10 mg Oral Q T,Th,Sa-HD  . mirtazapine  7.5 mg Oral QHS  . multivitamin with minerals  1 tablet Oral Daily  . multivitamin-lutein  1 capsule Oral Daily  . olopatadine  1 drop Both Eyes BID  . omega-3 acid ethyl esters  1 capsule Oral Daily  . vitamin B-12  1,000 mcg Oral Daily   sodium chloride, acetaminophen **OR** acetaminophen, HYDROmorphone (DILAUDID) injection, metoCLOPramide (REGLAN) injection, nitroGLYCERIN, ondansetron **OR** ondansetron (ZOFRAN) IV, oxyCODONE-acetaminophen  Assessment/ Plan:  Ms. Isys Tietje is a 67 y.o. african [place] female with end stage renal disease on peritoneal dialysis, hypertension, congestive heart failure, coronary artery disease status post CABG, diabetes mellitus type II insulin dependent, GERD, peripheral vascular disease admitted to [facility] on 01/29/2018  for leg pain.    [facility] [place] TTS hemodialysis RIJ permcath Transitioned from PD to HD this month  1. End Stage Renal Disease with hyperkalemia: transitioned from PD to hemodialysis.  - PD catheter removed  - Hemodialysis TTS schedule.  Start outpatient discharge planning  .patient seen during dialysis  2.  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 Anemia of chronic kidney disease: status post PRBC transfusions during this admission - EPO 10000 units with hemodialysis treatment.  -Blood transfusion given with hemodialysis 11/7  4. Hypotension:   - Midodrine before dialysis.   5. Secondary Hyperparathyroidism:  - Recent Phos is low; will hold renvela  Lavonia Dana, MD  [facility]  11/7/20192:21 PM

## 2018-02-26 NOTE — Progress Notes (Signed)
Stephanie Ellis is a 67 y.o. female with a history of end-stage renal disease, diabetes mellitus coronary artery disease status post CABG, peripheral neuropathy Left foot gangrene for which she was in [facility] between 01/29/18-02/15/18 and during that hospitalization had MSSA bacteremia, underwent removal of dialysis catheter, 2 d echo questioned tricuspid valve vegetation. She also had left great toe amputation ,and for the ischemic rt leg she had undergone percutaneous transluminal angioplasty of the left popliteal artery and mechanical aspiration of the left popliteal artery and left anterior tibial artery and left tibioperoneal trunk and peroneal artery emboli.  She was started on heparin. She was discharged to rehab facility on cefazolin to be given during days of dialysis for 6 weeks. She presented from the facility with purple discoloration of her rt leg She did not have any fever or chills No cough, sob or chest pain, palpitations, abdominal pain diarrhea, or muscle ache. She has numbness feet which is chronic and she did not experience any pain She has been started on vanco/cefepime and flagyl  Subjective Had Rt TMA yesterday Was very upset before that Quiet now Objective:  VITALS:  BP (!) 155/70 (BP Location: Left Arm)   Pulse 64   Temp 98.7 F (37.1 C) (Oral)   Resp 18   Ht 5' 4"  (1.626 m)   Wt 63.3 kg   SpO2 100%   BMI 23.95 kg/m  PHYSICAL EXAM:  General: pale, no distress but very quiet  Rt TMA    Left foot gangrene is stable, left great toe amputated site is dry 02/26/18        Pertinent Labs Lab Results CBC    Component Value Date/Time   WBC 17.4 (H) 02/26/2018 0843   RBC 2.22 (L) 02/26/2018 0843   HGB 6.9 (L) 02/26/2018 0843   HCT 23.8 (L) 02/26/2018 0843   PLT 338 02/26/2018 0843   MCV 107.2 (H) 02/26/2018 0843   MCH 31.1 02/26/2018 0843   MCHC 29.0 (L) 02/26/2018 0843   RDW 21.5 (H) 02/26/2018 0843   LYMPHSABS 1.6 02/18/2018 0756   MONOABS 1.0  02/18/2018 0756   EOSABS 0.2 02/18/2018 0756   BASOSABS 0.1 02/18/2018 0756    CMP Latest Ref Rng & Units 02/25/2018 02/24/2018 02/21/2018  Glucose 70 - 99 mg/dL 214(H) 156(H) 123(H)  BUN 8 - 23 mg/dL 24(H) 43(H) 26(H)  Creatinine 0.44 - 1.00 mg/dL 4.18(H) 7.00(H) 5.56(H)  Sodium 135 - 145 mmol/L 142 141 139  Potassium 3.5 - 5.1 mmol/L 4.5 5.0 4.7  Chloride 98 - 111 mmol/L 99 103 100  CO2 22 - 32 mmol/L 33(H) 29 27  Calcium 8.9 - 10.3 mg/dL 8.6(L) 8.8(L) 8.5(L)  Total Protein 6.5 - 8.1 g/dL - - -  Total Bilirubin 0.3 - 1.2 mg/dL - - -  Alkaline Phos 38 - 126 U/L - - -  AST 15 - 41 U/L - - -  ALT 0 - 44 U/L - - -      Microbiology: Recent Results (from the past 240 hour(s))  Blood Culture (routine x 2)     Status: None   Collection Time: 02/18/18  7:55 AM  Result Value Ref Range Status   Specimen Description BLOOD RIGHT AC  Final   Special Requests   Final    BOTTLES DRAWN AEROBIC AND ANAEROBIC Blood Culture results may not be optimal due to an excessive volume of blood received in culture bottles   Culture   Final    NO GROWTH 5 DAYS Performed  at [facility], [address] [place], [place] [address]    Report Status 02/23/2018 FINAL  Final  Blood Culture (routine x 2)     Status: None   Collection Time: 02/18/18  7:55 AM  Result Value Ref Range Status   Specimen Description BLOOD RIGHT FA  Final   Special Requests   Final    BOTTLES DRAWN AEROBIC AND ANAEROBIC Blood Culture results may not be optimal due to an excessive volume of blood received in culture bottles   Culture   Final    NO GROWTH 5 DAYS Performed at [facility], [address], [place], [place] [address]    Report Status 02/23/2018 FINAL  Final   IMAGING RESULTS: ? Impression/Recommendation 67 y.o. female with a history of end-stage renal disease, diabetes mellitus coronary artery disease status post CABG, peripheral neuropathy was brought to the ED fromrehab facility with purplish  discoloration of the rt foot She was recently in [facility] between 10/10-10 to 02/15/18 with worsening pain left leg. She had gangrene of the left great toe and foot and also had MSSA bacteremia, Tricuspid endocarditis ?  Rt foot gangrene:s/p TMA  ?Left great toe gangrene with the areas of ischemic islands on the foot. stable  Staph aureus bacteremia. Has tricuspid endocarditis The hemodialysis catheter has been removed . DC vanco and cefepime and restarted IV Cefazolin until 03/13/18    Coronary artery disease status post CABG    Diabetes mellitus on insulin  ? ? ___________________________________________________ Discussed with patient and hospitalist

## 2018-02-26 NOTE — Progress Notes (Signed)
HD completed. All blood returned per protocol. Patient much more alert after blood transfusion. Denies complaints. No further nausea/vomiting post treatment. BP improved, currently 154/57, HR 68, RR 10, T 97.7. No s/sx of transfusion reaction. Report called to primary RN. Received 2.5 hours of dialysis. Ran on 3k bath. UF +494cc due to persistent hypotension prior to transfusion. Status improved.

## 2018-02-26 NOTE — Progress Notes (Signed)
Inpatient Diabetes Program Recommendations  AACE/ADA: New Consensus Statement on Inpatient Glycemic Control (2015)  Target Ranges:  Prepandial:   less than 140 mg/dL      Peak postprandial:   less than 180 mg/dL (1-2 hours)      Critically ill patients:  140 - 180 mg/dL   Lab Results  Component Value Date   GLUCAP 157 (H) 02/26/2018   HGBA1C 6.5 (H) 02/18/2018    Review of Glycemic ControlResults for IVELISE, CASTILLO (MRN [id number]) as of 02/26/2018 12:58  Ref. Range 02/26/2018 08:00 02/26/2018 08:02 02/26/2018 09:55 02/26/2018 10:48 02/26/2018 12:28  Glucose-Capillary Latest Ref Range: 70 - 99 mg/dL 64 (L) 73 115 (H) 136 (H) 157 (H)    Diabetes history: Type 2 DM/ESRD Outpatient Diabetes medications:  Lantus 8 units daily Current orders for Inpatient glycemic control:  Novolog sensitive tid with meals and HS, Lantus 4 units q HS Inpatient Diabetes Program Recommendations:    May consider reducing Novolog correction to custom scale that starts at 151-200 mg/dL- 1 unit, 201-250 mg/dL-2 units, 251-300 mg/dL-3 units, 301-350 mg/dL- 4 units, 351-400 mg/dL- 5 units. Allso may need reduction in Lantus to 3 units q HS.   Thanks,  Adah Perl, RN, BC-ADM Inpatient Diabetes Coordinator Pager [phone] (8a-5p)

## 2018-02-26 NOTE — Progress Notes (Signed)
HD initiated via L Chest HD cath without issue. HD in bed today per Dr. Candiss Norse. Patient currently without complaints. Medicated for pain prior to arrival to HD. Lungs clear/diminished bases bilaterally. All vitals stable.

## 2018-02-26 NOTE — Consult Note (Signed)
Pharmacy Antibiotic Note  Stephanie Ellis is a 67 y.o. female admitted on 02/18/2018 with endocarditis.  Pharmacy has been consulted for cefazolin dosing.  Plan:    Height: 5' 4"  (162.6 cm) Weight: 139 lb 1.8 oz (63.1 kg) IBW/kg (Calculated) : 54.7  Temp (24hrs), Avg:98.1 F (36.7 C), Min:96.9 F (36.1 C), Max:98.7 F (37.1 C)  Recent Labs  Lab 02/20/18 0340  02/21/18 0927 02/22/18 0620 02/23/18 0615 02/24/18 0239 02/25/18 0530 02/26/18 0843 02/26/18 0844  WBC 13.1*   < > 16.4* 16.4* 16.6* 14.9* 14.2* 17.4*  --   CREATININE 3.51*  --  5.56*  --   --  7.00* 4.18*  --   --   VANCORANDOM  --   --   --   --   --  11  --   --  12   < > = values in this interval not displayed.    Estimated Creatinine Clearance: 11.3 mL/min (A) (by C-G formula based on SCr of 4.18 mg/dL (H)).     Antimicrobials this admission: Vanc 10/30>>11/7 Cefepime 10/30>>11/7 Flagyl 10/30>>11/02 Cefazolin 11/7>>  Microbiology results: 10/30 Bcx NGTD x5days 10/25 Bcx NGF 10/25 UCX NGF 10/18 Wcx pan-S S aureus  Thank you for allowing pharmacy to be a part of this patient's care.  Prior outpatient plan was:  Indication: Osteomyelitis/Endocarditis Regimen: After dialyses- Cefazolin - 2g Tuesday, 2g Thursday, 3g Saturday until complete End date: 03/13/18    Forrest Moron, PharmD Clinical Pharmacist 02/26/2018 10:43 AM

## 2018-02-26 NOTE — Progress Notes (Signed)
[facility] [place] NAME: Stephanie Ellis    MR#:  [id number]  DATE OF BIRTH:  1950/05/15  SUBJECTIVE:  patient trying to eat lunch. Had dialysis today. Got one unit of blood transfusion at dialysis today. REVIEW OF SYSTEMS:  Review of Systems  Constitutional: Negative for chills and fever.  HENT: Negative for congestion and sore throat.   Eyes: Negative for blurred vision and double vision.  Respiratory: Negative for cough and shortness of breath.   Cardiovascular: Negative for chest pain, palpitations and leg swelling.  Gastrointestinal: Negative for abdominal pain, nausea and vomiting.  Genitourinary: Negative for dysuria and frequency.  Musculoskeletal: Negative for back pain and neck pain.  Neurological: Negative for dizziness and headaches.  Psychiatric/Behavioral: Negative for depression. The patient is not nervous/anxious.     DRUG ALLERGIES:   Allergies  Allergen Reactions  . Chlorthalidone Anaphylaxis, Itching and Rash  . Fentanyl Rash  . Midazolam Rash  . Ace Inhibitors Other (See Comments)    Reaction:  Hyperkalemia, agitation   . Angiotensin Receptor Blockers Other (See Comments)    Reaction:  Hyperkalemia, agitation   . Norvasc [Amlodipine] Itching and Rash  . Phenergan [Promethazine Hcl] Anxiety    "antsy, can't sit still"   VITALS:  Blood pressure 132/62, pulse 69, temperature 98.7 F (37.1 C), temperature source Oral, resp. rate 16, height 5' 4"  (1.626 m), weight 63.3 kg, SpO2 98 %. PHYSICAL EXAMINATION:  Physical Exam  GENERAL:  67 y.o.-year-old patient lying in the bed with no acute distress.  EYES: Pupils equal, round, reactive to light and accommodation. No scleral icterus. Extraocular muscles intact.  HEENT: Head atraumatic, normocephalic. Oropharynx and nasopharynx clear. Left eye watery and congestion NECK:  Supple, no jugular venous distention. No thyroid enlargement, no tenderness.  LUNGS: Normal  breath sounds bilaterally, no wheezing, rales,rhonchi or crepitation. No use of accessory muscles of respiration.  CARDIOVASCULAR: RRR, S1, S2 normal. No murmurs, rubs, or gallops.  ABDOMEN: Soft, nontender, nondistended. Bowel sounds present. No organomegaly or mass.  EXTREMITIES: . +left great toe amputation with some dorsal necrosis present. No foul odor Right BKA stump+ with dressing NEUROLOGIC: Cranial nerves II through XII are intact. gen weakness Sensation intact. Gait not checked.  PSYCHIATRIC: patient is alert and oriented x 2.  SKIN: see above LABORATORY PANEL:  Female CBC Recent Labs  Lab 02/26/18 0843  WBC 17.4*  HGB 6.9*  HCT 23.8*  PLT 338   ------------------------------------------------------------------------------------------------------------------ Chemistries  Recent Labs  Lab 02/25/18 0530  NA 142  K 4.5  CL 99  CO2 33*  GLUCOSE 214*  BUN 24*  CREATININE 4.18*  CALCIUM 8.6*  MG 2.0   RADIOLOGY:  No results found. ASSESSMENT AND PLAN:  Stephanie Ellis  is a 67 y.o. 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  She was sent out to rehab on Eliquis.  She was brought back in with ischemia of the right foot and toes and also ischemia on the left foot and toe.  *Bilateral lower extremity gangrene- s/p angiogram of RLE this morning with stents placed, also s/p left great toe amputation at last hospitalization. 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 Lucky Cowboy and Dr. Vickki Muff no further plans for any workup on left lower extremity  *Recent history of endocarditis- s/p septic embolization treated with  revascularization -Continue Cefazolin till 03/13/2018  *ESRD on HD TTS -Nephrology consulted -She would like her perm cath be removed  * Anemia of chronic disease hemoglobin 6.9--s/p Blood  transfusion   *T2DM- blood sugars well-controlled -Continue lantus and SSI  * Elevated troponin with history of CAD s/p CABG- stable, no active chest pain. Troponin likely elevated due to reduced renal clearance. -Continue aspirin  * Chronic diastolic CHF- no signs of volume overload -fluid management per nephro  *Staph aureus bacteremia. Has tricuspid endocarditis Will be getting  IV Cefazolin until 03/13/18 Pt has perm cath for HD  *Left eye congestion with blurred vision -with daughter to call [facility] to see if patient is able to get that shot in the eye by ophthalmologist then will place ophthalmologist consult.  Patient is a long-term resident at [facility]. Will discharged tomorrow if she remains stable.  CODE STATUS: Full Code  TOTAL TIME TAKING CARE OF THIS PATIENT: 25 minutes.   More than 50% of the time was spent in counseling/coordination of care: YES  POSSIBLE D/C in ?? DAYS, DEPENDING ON CLINICAL CONDITION.   Fritzi Mandes M.D on 02/26/2018 at 2:25 PM  Between 7am to 6pm - Pager - [phone]  After 6pm go to [email or web address] - Proofreader  [facility]  Office  [phone]  CC: Primary care physician; System, Pcp Not In  Note: This dictation was prepared with Dragon dictation along with smaller phrase technology. Any transcriptional errors that result from this process are unintentional.

## 2018-02-26 NOTE — Progress Notes (Signed)
PT Cancellation Note  Patient Details Name: Stephanie Ellis MRN: [id number] DOB: [birth year]   Cancelled Treatment:    Reason Eval/Treat Not Completed: Medical issues which prohibited therapy(Consult received and chart reviewed.  Patient currently off unit for dialysis.  Will re-attempt at later time/date as medically appropriate and available.)   Nyilah Kight H. Owens Shark, PT, DPT, NCS 02/26/18, 10:05 AM [phone]

## 2018-02-26 NOTE — Progress Notes (Signed)
Per MD for RN to place PT order.

## 2018-02-26 NOTE — Progress Notes (Signed)
Pre hd 

## 2018-02-26 NOTE — Progress Notes (Signed)
Patient not feeling well. Dr. Candiss Norse notified. Order to give 250cc saline bolus for hypotension and decrease time to 2.5 hours. Done. Continue to monitor. Patient currently sleeping.

## 2018-02-26 NOTE — Progress Notes (Signed)
Transfusion running on dialysis. BP improved to 110/51. Patient much more responsive. Denies s/sx transfusion reaction. Continue to monitor closely. HOB remains down. UF remains off.

## 2018-02-27 LAB — TYPE AND SCREEN
ABO/RH(D): B POS
Antibody Screen: NEGATIVE
Unit division: 0

## 2018-02-27 LAB — CBC
HCT: 31.6 % — ABNORMAL LOW (ref 36.0–46.0)
Hemoglobin: 9.4 g/dL — ABNORMAL LOW (ref 12.0–15.0)
MCH: 30.6 pg (ref 26.0–34.0)
MCHC: 29.7 g/dL — ABNORMAL LOW (ref 30.0–36.0)
MCV: 102.9 fL — ABNORMAL HIGH (ref 80.0–100.0)
Platelets: 335 10*3/uL (ref 150–400)
RBC: 3.07 MIL/uL — ABNORMAL LOW (ref 3.87–5.11)
RDW: 21.2 % — ABNORMAL HIGH (ref 11.5–15.5)
WBC: 22.8 10*3/uL — ABNORMAL HIGH (ref 4.0–10.5)
nRBC: 1 % — ABNORMAL HIGH (ref 0.0–0.2)

## 2018-02-27 LAB — GLUCOSE, CAPILLARY
Glucose-Capillary: 55 mg/dL — ABNORMAL LOW (ref 70–99)
Glucose-Capillary: 60 mg/dL — ABNORMAL LOW (ref 70–99)
Glucose-Capillary: 77 mg/dL (ref 70–99)
Glucose-Capillary: 180 mg/dL — ABNORMAL HIGH (ref 70–99)
Glucose-Capillary: 212 mg/dL — ABNORMAL HIGH (ref 70–99)
Glucose-Capillary: 109 mg/dL — ABNORMAL HIGH (ref 70–99)

## 2018-02-27 LAB — BPAM RBC
Blood Product Expiration Date: 201911252359
ISSUE DATE / TIME: 201911071052
Unit Type and Rh: 7300

## 2018-02-27 LAB — SURGICAL PATHOLOGY

## 2018-02-27 MED ORDER — POLYVINYL ALCOHOL 1.4 % OP SOLN
1.0000 [drp] | OPHTHALMIC | Status: DC | PRN
  Administered 2018-02-27: 1 [drp] via OPHTHALMIC
  Filled 2018-02-27 (×2): qty 15

## 2018-02-27 NOTE — Progress Notes (Signed)
Hypoglycemic Event  CBG: 0737- 60   Treatment: 1/2 cup of ginger ale, 1 cup of sprite on the second 15 minute check  Symptoms: asymptomatic   Follow-up CBG: Time:0808 & CBG 55, 0831 CBG 77   Comments/MD notified: no need to notified     Stephanie Ellis

## 2018-02-27 NOTE — Anesthesia Postprocedure Evaluation (Signed)
Anesthesia Post Note  Patient: Stephanie Ellis  Procedure(s) Performed: AMPUTATION BELOW KNEE (Right ) REMOVAL OF A DIALYSIS CATHETER (N/A )  Patient location during evaluation: PACU Anesthesia Type: General Level of consciousness: awake and alert Pain management: pain level controlled Vital Signs Assessment: post-procedure vital signs reviewed and stable Respiratory status: spontaneous breathing, nonlabored ventilation, respiratory function stable and patient connected to nasal cannula oxygen Cardiovascular status: blood pressure returned to baseline and stable Postop Assessment: no apparent nausea or vomiting Anesthetic complications: no     Last Vitals:  Vitals:   02/27/18 0500 02/27/18 0746  BP: 139/64 (!) 150/61  Pulse: 64 60  Resp: 16 17  Temp: 37.1 C 36.9 C  SpO2: 100% 92%    Last Pain:  Vitals:   02/27/18 0746  TempSrc: Oral  PainSc: 0-No pain                 Martha Clan

## 2018-02-27 NOTE — Evaluation (Signed)
Physical Therapy Evaluation Patient Details Name: Stephanie Ellis MRN: [id number] DOB: January 08, 1951 Today's Date: 02/27/2018   History of Present Illness  67 year old female admitted with  Dialysis permcath removed 02/04/18.  R fem temporary HD cath placed 02/06/18.  Pt s/p L great toe amp (MTPJ) 10/18.  PMH includes peritoneal (removed recently) dialysis, DMII, CABG x3, CAD, MI, ESRD, CAD, CHF, anorexia and anemia.  Clinical Impression  Pt eager to work with PT and pleasant t/o the exam and subsequent therapeutic exercises and mobility training.  She does have full TKE on the R, but lacks flexion past ~80 degrees.  Educated on importance of maintaining ROM post amputation.  She showed reasonable strength t/o, but did not have UE strength to confidently unweight with walker to attempt transfers and she lacks L ankle DF past neutral which makes heel WBing with post-op boot more challenging.  Overall pt with great attitude and willingness to work back to some function, but she does realize she has a lot of effort to put in to get back to this status.    Follow Up Recommendations SNF    Equipment Recommendations  (TBD at rehab)    Recommendations for Other Services       Precautions / Restrictions Precautions Precautions: Fall Restrictions Weight Bearing Restrictions: Yes LLE Weight Bearing: Weight bearing as tolerated Other Position/Activity Restrictions: Heel WB'ing L foot for transfers only with post op shoe (per previous admit)      Mobility  Bed Mobility Overal bed mobility: Modified Independent Bed Mobility: Supine to Sit     Supine to sit: Min guard     General bed mobility comments: relaint on rails, but able to get to sitting w/o assist  Transfers Overall transfer level: Needs assistance Equipment used: Rolling walker (2 wheeled) Transfers: Sit to/from Stand Sit to Stand: Mod assist;Max assist   Squat pivot transfers: Mod assist     General transfer comment: Pt  struggled on both standing bouts.  Heavy cuing for set up, etc but pt could not get UEs to rise confidently and with post-op boot she struggled to be able to shift weight forward on single LE to rise.  Pt was unable to unweight L LE with UEs and struggled to heel-toe shift 90 deg to get to recliner.  Needed assist.  Ambulation/Gait             General Gait Details: unable to ambulate, initiating SPT this date  Stairs            Wheelchair Mobility    Modified Rankin (Stroke Patients Only)       Balance Overall balance assessment: Needs assistance Sitting-balance support: Bilateral upper extremity supported Sitting balance-Leahy Scale: Fair     Standing balance support: Bilateral upper extremity supported Standing balance-Leahy Scale: Poor Standing balance comment: struggled to maintain static standing balance with walker and assist                             Pertinent Vitals/Pain Pain Assessment: (some phantom pain in R, no L LE pain) Pain Score: (unable to rate, "I know it's just in my head")    Home Living Family/patient expects to be discharged to:: Skilled nursing facility Living Arrangements: Children                    Prior Function Level of Independence: Independent with assistive device(s)  Comments: Has not been ambulating much since last hospitalization 2-3 weeks ago     Hand Dominance        Extremity/Trunk Assessment   Upper Extremity Assessment Upper Extremity Assessment: Overall WFL for tasks assessed;Generalized weakness    Lower Extremity Assessment Lower Extremity Assessment: Generalized weakness(AROM with all motions b/l, L ankle DF to neutral only)       Communication   Communication: No difficulties  Cognition Arousal/Alertness: Awake/alert Behavior During Therapy: WFL for tasks assessed/performed Overall Cognitive Status: Within Functional Limits for tasks assessed                                         General Comments      Exercises Total Joint Exercises Ankle Circles/Pumps: Left;AROM;10 reps Quad Sets: Strengthening;Both;5 reps Gluteal Sets: Strengthening;5 reps;Both Short Arc Quad: AAROM;Strengthening;Left;10 reps;Supine Heel Slides: AAROM;Strengthening;Left;10 reps;Supine Hip ABduction/ADduction: Strengthening;10 reps;Both Straight Leg Raises: AROM;10 reps;Both   Assessment/Plan    PT Assessment Patient needs continued PT services  PT Problem List Decreased strength;Decreased mobility;Decreased balance;Decreased knowledge of use of DME;Pain;Decreased activity tolerance       PT Treatment Interventions DME instruction;Therapeutic activities;Gait training;Therapeutic exercise;Patient/family education;Balance training;Functional mobility training;Neuromuscular re-education    PT Goals (Current goals can be found in the Care Plan section)  Acute Rehab PT Goals Patient Stated Goal: get walking again PT Goal Formulation: With patient Time For Goal Achievement: 03/13/18 Potential to Achieve Goals: Fair    Frequency 7X/week   Barriers to discharge        Co-evaluation               AM-PAC PT "6 Clicks" Daily Activity  Outcome Measure Difficulty turning over in bed (including adjusting bedclothes, sheets and blankets)?: None Difficulty moving from lying on back to sitting on the side of the bed? : None Difficulty sitting down on and standing up from a chair with arms (e.g., wheelchair, bedside commode, etc,.)?: Unable Help needed moving to and from a bed to chair (including a wheelchair)?: Total Help needed walking in hospital room?: Total Help needed climbing 3-5 steps with a railing? : Total 6 Click Score: 12    End of Session Equipment Utilized During Treatment: Gait belt Activity Tolerance: Patient tolerated treatment well Patient left: in chair;with call bell/phone within reach;with chair alarm set;with nursing/sitter in room   PT  Visit Diagnosis: Unsteadiness on feet (R26.81);Muscle weakness (generalized) (M62.81);Pain Pain - Right/Left: Right Pain - part of body: Knee    Time: 1898-4210 PT Time Calculation (min) (ACUTE ONLY): 39 min   Charges:   PT Evaluation $PT Eval Low Complexity: 1 Low PT Treatments $Therapeutic Exercise: 8-22 mins $Therapeutic Activity: 8-22 mins        Kreg Shropshire, DPT 02/27/2018, 1:28 PM

## 2018-02-27 NOTE — Progress Notes (Signed)
Nutrition Initial Assessment   DOCUMENTATION CODES:   Not applicable  INTERVENTION:   Ensure Enlive po BID, each supplement provides 350 kcal and 20 grams of protein  MVI daily  B-Complex with C  Ocuvite daily for wound healing (provides zinc, vitamin A, vitamin C, Vitamin E, copper, and selenium)  Magic cup TID with meals, each supplement provides 290 kcal and 9 grams of protein  Liberalize diet  Bowel regimen per MD  NUTRITION DIAGNOSIS:   Increased nutrient needs related to chronic illness(ESRD on HD, wound healing ) as evidenced by increased estimated needs.  GOAL:   Patient will meet greater than or equal to 90% of their needs   MONITOR:   PO intake, Supplement acceptance, Labs, Weight trends, Skin, I & O's  ASSESSMENT:   67 y.o. 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  She was sent out to rehab on Eliquis.  She was brought back in with ischemia of the right foot and toes and also ischemia on the left foot and toe. 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 Pt with poor appetite and oral intake during her last admit; pt eating <25% of meals at that time. Pt with improved appetite and oral intake this admit; pt eating 75% of meals and drinking some Ensure. Recommend continue vitamins and supplements. RD will liberalize diet. Pt with low P on 11/5; renal panel ordered for tomorrow. Pt likely at refeed risk. Per chart, pt with ~10lb weight loss since admit; pt s/p BKA on 11/7. Last BM noted on 11/7 was type 2; recommend bowel regimen per MD.   Medications reviewed and include: aspirin, B-complex with C, colace, epoeitin, pepcid, folic acid, insulin,  remeron, MVI, omega 3, B12, cefazolin   Labs reviewed: K 4.5 wnl, BUN 24(H), creat 4.18(H), Mg 2.0 wnl-11/6 P 2.2(L)- 11/5 Wbc- 22.8(H), Hgb 9.4(L), Hct 31.6(L), MCV 102.9(H) cbgs- 60, 55, 77, 180 x 24 hrs  Diet Order:   Diet Order            Diet renal/carb modified with fluid restriction Diet-HS Snack? Nothing; Fluid restriction: 1200 mL Fluid; Room service appropriate? Yes; Fluid consistency: Thin  Diet effective now             EDUCATION NEEDS:   No education needs have been identified at this time  Skin:  Skin Assessment: Reviewed RN Assessment(closed incision abdomen, incision L leg)  Last BM:  11/7- TYPE 2  Height:   Ht Readings from Last 1 Encounters:  02/25/18 5' 4"  (1.626 m)    Weight:   Wt Readings from Last 1 Encounters:  02/26/18 63.3 kg    Ideal Body Weight:  51.3 kg(adjusted for BKA)  BMI:  Body mass index is 23.95 kg/m.  Estimated Nutritional Needs:   Kcal:  1400-1600kcal/day   Protein:  71-83g/day   Fluid:  UOP +1L  Koleen Distance MS, RD, LDN Pager #- [phone] Office#- [phone] After Hours Pager: [phone]

## 2018-02-27 NOTE — Progress Notes (Signed)
[facility]  ROUNDING NOTE   Subjective:   Patient was given blood transfusion with hemodialysis yesterday She had episodes of hypotension and required fluid boluses This morning she feels well and feels like back to baseline Pain is controlled Able to eat without nausea or vomiting  Objective:  Vital signs in last 24 hours:  Temp:  [97.7 F (36.5 C)-98.7 F (37.1 C)] 98.4 F (36.9 C) (11/08 0746) Pulse Rate:  [60-72] 60 (11/08 0746) Resp:  [15-18] 17 (11/08 0746) BP: (109-155)/(49-70) 150/61 (11/08 0746) SpO2:  [92 %-100 %] 92 % (11/08 0746) Weight:  [63.3 kg] 63.3 kg (11/07 1138)  Weight change: -0.9 kg Filed Weights   02/25/18 1107 02/26/18 0905 02/26/18 1138  Weight: 64 kg 63.1 kg 63.3 kg    Intake/Output: I/O last 3 completed shifts: In: 61.9 [IV Piggyback:61.9] Out: -494    Intake/Output this shift:  Total I/O In: 120 [P.O.:120] Out: -   Physical Exam: General: NAD  Head: Normocephalic, atraumatic. Moist oral mucosal membranes  Eyes: Anicteric,   Neck: Supple, trachea midline  Lungs:  Clear to auscultation  Heart: Regular rate and rhythm  Abdomen:  Soft, nontender  Extremities:  left foot wound, right BKA  Neurologic:  Alert and oriented  Skin: No lesions  Access:  left IJ tunneled catheter    Basic Metabolic Panel: Recent Labs  Lab 02/21/18 0927 02/24/18 0239 02/25/18 0530  NA 139 141 142  K 4.7 5.0 4.5  CL 100 103 99  CO2 27 29 33*  GLUCOSE 123* 156* 214*  BUN 26* 43* 24*  CREATININE 5.56* 7.00* 4.18*  CALCIUM 8.5* 8.8* 8.6*  MG  --   --  2.0  PHOS 4.1 2.2*  --     Liver Function Tests: Recent Labs  Lab 02/21/18 0927 02/24/18 0239  ALBUMIN 1.8* 1.9*   No results for input(s): LIPASE, AMYLASE in the last 168 hours. No results for input(s): AMMONIA in the last 168 hours.  CBC: Recent Labs  Lab 02/23/18 0615 02/24/18 0239 02/25/18 0530 02/26/18 0843 02/27/18 0958  WBC 16.6* 14.9* 14.2* 17.4* 22.8*  HGB 8.0*  8.0* 8.5* 6.9* 9.4*  HCT 27.3* 27.3* 29.0* 23.8* 31.6*  MCV 103.4* 104.2* 102.8* 107.2* 102.9*  PLT 365 369 380 338 335    Cardiac Enzymes: No results for input(s): CKTOTAL, CKMB, CKMBINDEX, TROPONINI in the last 168 hours.  BNP: Invalid input(s): POCBNP  CBG: Recent Labs  Lab 02/26/18 1553 02/26/18 2136 02/27/18 0737 02/27/18 0808 02/27/18 0831  GLUCAP 177* 151* 60* 55* 59    Microbiology: Results for orders placed or performed during the hospital encounter of 02/18/18  Blood Culture (routine x 2)     Status: None   Collection Time: 02/18/18  7:55 AM  Result Value Ref Range Status   Specimen Description BLOOD RIGHT AC  Final   Special Requests   Final    BOTTLES DRAWN AEROBIC AND ANAEROBIC Blood Culture results may not be optimal due to an excessive volume of blood received in culture bottles   Culture   Final    NO GROWTH 5 DAYS Performed at [facility], [address] [place], [place] [address]    Report Status 02/23/2018 FINAL  Final  Blood Culture (routine x 2)     Status: None   Collection Time: 02/18/18  7:55 AM  Result Value Ref Range Status   Specimen Description BLOOD RIGHT FA  Final   Special Requests   Final  BOTTLES DRAWN AEROBIC AND ANAEROBIC Blood Culture results may not be optimal due to an excessive volume of blood received in culture bottles   Culture   Final    NO GROWTH 5 DAYS Performed at [facility], [address] [place], [place] [address]    Report Status 02/23/2018 FINAL  Final    Coagulation Studies: Recent Labs    02/25/18 0530  LABPROT 14.4  INR 1.13    Urinalysis: No results for input(s): COLORURINE, LABSPEC, PHURINE, GLUCOSEU, HGBUR, BILIRUBINUR, KETONESUR, PROTEINUR, UROBILINOGEN, NITRITE, LEUKOCYTESUR in the last 72 hours.  Invalid input(s): APPERANCEUR    Imaging: No results found.   Medications:   . sodium chloride 0 mL/hr at 02/22/18 1826  .  ceFAZolin (ANCEF) IV Stopped (02/26/18 1754)    . sodium chloride   Intravenous Once  . aspirin EC  81 mg Oral Daily  . atorvastatin  80 mg Oral QHS  . B-complex with vitamin C  1 tablet Oral Daily  . carvedilol  3.125 mg Oral BID WC  . docusate sodium  100 mg Oral BID  . epoetin (EPOGEN/PROCRIT) injection  10,000 Units Intravenous Q T,Th,Sa-HD  . famotidine  10 mg Oral Daily  . feeding supplement (ENSURE ENLIVE)  237 mL Oral BID BM  . folic acid  1 mg Oral Daily  . insulin aspart  0-5 Units Subcutaneous QHS  . insulin aspart  0-9 Units Subcutaneous TID WC  . insulin glargine  4 Units Subcutaneous QHS  . isosorbide mononitrate  30 mg Oral Daily  . midodrine  10 mg Oral Q T,Th,Sa-HD  . mirtazapine  7.5 mg Oral QHS  . multivitamin with minerals  1 tablet Oral Daily  . multivitamin-lutein  1 capsule Oral Daily  . olopatadine  1 drop Both Eyes BID  . omega-3 acid ethyl esters  1 capsule Oral Daily  . vitamin B-12  1,000 mcg Oral Daily   sodium chloride, acetaminophen **OR** acetaminophen, metoCLOPramide (REGLAN) injection, nitroGLYCERIN, ondansetron **OR** ondansetron (ZOFRAN) IV, oxyCODONE-acetaminophen  Assessment/ Plan:  [name] is a 67 y.o. african [place] female with end stage renal disease on peritoneal dialysis, hypertension, congestive heart failure, coronary artery disease status post CABG, diabetes mellitus type II insulin dependent, GERD, peripheral vascular disease admitted to [facility] on 01/29/2018 for leg pain.    [facility] [place] TTS hemodialysis RIJ permcath Transitioned from PD to HD this month  1. End Stage Renal Disease with hyperkalemia: transitioned from PD to hemodialysis.  - PD catheter removed  - Hemodialysis TTS schedule.  Start outpatient discharge planning  -Routine hemodialysis tomorrow seated in chair  2.  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 Anemia of chronic kidney disease:  status post PRBC transfusions during this admission - EPO 10000 units with hemodialysis treatment.  -Blood transfusion given with hemodialysis 11/7  4. Hypotension:   - Midodrine before dialysis.   5. Secondary Hyperparathyroidism:  - Recent Phos is low; will hold renvela  Lavonia Dana, [facility]  11/8/201911:28 AM

## 2018-02-27 NOTE — Progress Notes (Signed)
[facility]  Daily Progress Note   Subjective: 2 Days Post-Op: Right below-the-knee amputation with removal peritoneal dialysis catheter  Patient in good spirits this AM. Dialyzed yesterday through permcath without issue. Pain seems to be controlled with PO pain regimen.   Objective: Vitals:   02/26/18 1553 02/26/18 1917 02/27/18 0500 02/27/18 0746  BP: (!) 109/49 (!) 155/70 139/64 (!) 150/61  Pulse: 72 64 64 60  Resp: 15 18 16 17   Temp: 98.4 F (36.9 C) 98.7 F (37.1 C) 98.7 F (37.1 C) 98.4 F (36.9 C)  TempSrc: Oral Oral Oral Oral  SpO2: 100% 100% 100% 92%  Weight:      Height:        Intake/Output Summary (Last 24 hours) at 02/27/2018 1022 Last data filed at 02/27/2018 1019 Gross per 24 hour  Intake 181.86 ml  Output -494 ml  Net 675.86 ml   Physical Exam: A&Ox3, NAD CV: Irregularly Irregular Pulmonary: CTA Bilaterally Abdomen: Soft, Nontender, Nondistended, (+) Bowel Sounds, Incisions healing well, clean, dry and intact Vascular:  Right Lower Extremity: OR dressing removed. Stump looks excellent. No staples. Skin has been closed. Incision is clean, dry and intact. Healthy appearance. Warm. Thigh soft. Healing well.   Left Lower Extremity: Discoloration / edema stable. No progressing of necrosis. Warm.    Laboratory: CBC    Component Value Date/Time   WBC 17.4 (H) 02/26/2018 0843   HGB 6.9 (L) 02/26/2018 0843   HCT 23.8 (L) 02/26/2018 0843   PLT 338 02/26/2018 0843   BMET    Component Value Date/Time   NA 142 02/25/2018 0530   K 4.5 02/25/2018 0530   CL 99 02/25/2018 0530   CO2 33 (H) 02/25/2018 0530   GLUCOSE 214 (H) 02/25/2018 0530   BUN 24 (H) 02/25/2018 0530   CREATININE 4.18 (H) 02/25/2018 0530   CALCIUM 8.6 (L) 02/25/2018 0530   GFRNONAA 10 (L) 02/25/2018 0530   GFRAA 12 (L) 02/25/2018 0530   Assessment/Planning: 67 year old female s/p right BKA POD #2 - Stable 1) OR dressing changed. Right BKA healing well.  2) Will  see patient back in office in two weeks for stump check / left foot check - will possible need amputation in the future. 3) Had long discussion with patient and her family member present about keeping her knee join flexible and the risk of contracture if she doesn't. She expresses her understanding.  4) Consulted wound care for wound care instructions to left foot upon discharge. 5) OK from vascular standpoint to discharge to nursing facility.   Discussed with Dr. Ellis Parents  PA-C 02/27/2018 10:22 AM

## 2018-02-27 NOTE — Consult Note (Signed)
Pharmacy Antibiotic Note  Stephanie Ellis is a 67 y.o. female admitted on 02/18/2018 with endocarditis.  Pharmacy has been consulted for cefazolin dosing.  Plan: Cefazolin 1 gm IV every 24 hours.   Height: 5' 4"  (162.6 cm) Weight: 139 lb 8.8 oz (63.3 kg) IBW/kg (Calculated) : 54.7  Temp (24hrs), Avg:98.1 F (36.7 C), Min:97.5 F (36.4 C), Max:98.7 F (37.1 C)  Recent Labs  Lab 02/21/18 0927 02/22/18 0620 02/23/18 0615 02/24/18 0239 02/25/18 0530 02/26/18 0843 02/26/18 0844  WBC 16.4* 16.4* 16.6* 14.9* 14.2* 17.4*  --   CREATININE 5.56*  --   --  7.00* 4.18*  --   --   VANCORANDOM  --   --   --  11  --   --  12    Estimated Creatinine Clearance: 11.3 mL/min (A) (by C-G formula based on SCr of 4.18 mg/dL (H)).     Antimicrobials this admission: Vanc 10/30>>11/7 Cefepime 10/30>>11/7 Flagyl 10/30>>11/02 Cefazolin 11/7>>  Microbiology results: 10/30 Bcx NGTD x5days 10/25 Bcx NGF 10/25 UCX NGF 10/18 Wcx pan-S S aureus  Thank you for allowing pharmacy to be a part of this patient's care.  Prior outpatient plan was:  Indication: Osteomyelitis/Endocarditis Regimen: After dialyses- Cefazolin - 2g Tuesday, 2g Thursday, 3g Saturday until complete End date: 03/13/18    Forrest Moron, PharmD Clinical Pharmacist 02/27/2018 10:24 AM

## 2018-02-27 NOTE — Progress Notes (Signed)
OT Cancellation Note  Patient Details Name: Stephanie Ellis MRN: [id number] DOB: 05/25/1950   Cancelled Treatment:    Reason Eval/Treat Not Completed: Medical issues which prohibited therapy(Pt. presents with low Hgb 6.9. which is contraindicated for OT sevices. Will continue to monitor and intervene when appropriate.)  Harrel Carina, MS, OTR/L 02/27/2018, 9:36 AM

## 2018-02-27 NOTE — Progress Notes (Signed)
[facility] NAME: Stephanie Ellis    MR#:  [id number]  DATE OF BIRTH:  07-28-1950  SUBJECTIVE:  patient trying to eat lunch. Had dialysis today. Got one unit of blood transfusion at dialysis yday Feels ok REVIEW OF SYSTEMS:  Review of Systems  Constitutional: Negative for chills and fever.  HENT: Negative for congestion and sore throat.   Eyes: Negative for blurred vision and double vision.  Respiratory: Negative for cough and shortness of breath.   Cardiovascular: Negative for chest pain, palpitations and leg swelling.  Gastrointestinal: Negative for abdominal pain, nausea and vomiting.  Genitourinary: Negative for dysuria and frequency.  Musculoskeletal: Negative for back pain and neck pain.  Neurological: Negative for dizziness and headaches.  Psychiatric/Behavioral: Negative for depression. The patient is not nervous/anxious.     DRUG ALLERGIES:   Allergies  Allergen Reactions  . Chlorthalidone Anaphylaxis, Itching and Rash  . Fentanyl Rash  . Midazolam Rash  . Ace Inhibitors Other (See Comments)    Reaction:  Hyperkalemia, agitation   . Angiotensin Receptor Blockers Other (See Comments)    Reaction:  Hyperkalemia, agitation   . Norvasc [Amlodipine] Itching and Rash  . Phenergan [Promethazine Hcl] Anxiety    "antsy, can't sit still"   VITALS:  Blood pressure (!) 150/61, pulse 60, temperature 98.4 F (36.9 C), temperature source Oral, resp. rate 17, height 5' 4"  (1.626 m), weight 63.3 kg, SpO2 92 %. PHYSICAL EXAMINATION:  Physical Exam  GENERAL:  67 y.o.-year-old patient lying in the bed with no acute distress. Appears chronically ill EYES: Pupils equal, round, reactive to light and accommodation. No scleral icterus. Extraocular muscles intact.  HEENT: Head atraumatic, normocephalic. Oropharynx and nasopharynx clear. Left eye watery and congestion NECK:  Supple, no jugular venous distention. No thyroid enlargement,  no tenderness.  LUNGS: Normal breath sounds bilaterally, no wheezing, rales,rhonchi or crepitation. No use of accessory muscles of respiration.  CARDIOVASCULAR: RRR, S1, S2 normal. No murmurs, rubs, or gallops.  ABDOMEN: Soft, nontender, nondistended. Bowel sounds present. No organomegaly or mass.  EXTREMITIES: . +left great toe amputation with some dorsal necrosis present--dressing+. No foul odor Right BKA stump+ with dressing NEUROLOGIC: Cranial nerves II through XII are intact. gen weakness Sensation intact. Gait not checked.  PSYCHIATRIC: patient is alert and oriented x 2.  SKIN: see above LABORATORY PANEL:  Female CBC Recent Labs  Lab 02/27/18 0958  WBC 22.8*  HGB 9.4*  HCT 31.6*  PLT 335   ------------------------------------------------------------------------------------------------------------------ Chemistries  Recent Labs  Lab 02/25/18 0530  NA 142  K 4.5  CL 99  CO2 33*  GLUCOSE 214*  BUN 24*  CREATININE 4.18*  CALCIUM 8.6*  MG 2.0   RADIOLOGY:  No results found. ASSESSMENT AND PLAN:  Stephanie Ellis  is a 67 y.o. 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  She was sent out to rehab on Eliquis.  She was brought back in with ischemia of the right foot and toes and also ischemia on the left foot and toe.  *Bilateral lower extremity gangrene- s/p angiogram of RLE this morning with stents placed, also s/p left great toe amputation at last hospitalization. 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 Lucky Cowboy and Dr. Vickki Muff no further plans for any workup on left lower extremity -no fever, vitals stable Wbc  some reactive with recent BKA  *Recent history of endocarditis- s/p septic embolization treated with revascularization -Continue Cefazolin till 03/13/2018  *ESRD on HD TTS -Nephrology consulted -She  would like her perm cath be removed -pt needs to be sitting in the chair for HD--will try that tomorrow and then d/c to [facility]  * Anemia of chronic disease hemoglobin 6.9--s/p Blood transfusion --9.4  *T2DM- blood sugars well-controlled -Continue lantus and SSI  * Elevated troponin with history of CAD s/p CABG- stable, no active chest pain. Troponin likely elevated due to reduced renal clearance. -Continue aspirin  * Chronic diastolic CHF- no signs of volume overload -fluid management per nephro  *Staph aureus bacteremia. Has tricuspid endocarditis Will be getting  IV Cefazolin until 03/13/18 Pt has perm cath for HD  *Left eye congestion with blurred vision -with daughter to call [facility] to see if patient is able to get that shot in the eye by ophthalmologist then will place ophthalmologist consult.  Patient is a long-term resident at [facility]. Will discharged tomorrow after HD if she remains stable.  CODE STATUS: Full Code  TOTAL TIME TAKING CARE OF THIS PATIENT: 25 minutes.   More than 50% of the time was spent in counseling/coordination of care: YES  POSSIBLE D/C in ?? DAYS, DEPENDING ON CLINICAL CONDITION.   Fritzi Mandes M.D on 02/27/2018 at 10:38 AM  Between 7am to 6pm - Pager - [phone]  After 6pm go to [email or web address] - Proofreader  [facility]  Office  [phone]  CC: Primary care physician; System, Pcp Not In  Note: This dictation was prepared with Dragon dictation along with smaller phrase technology. Any transcriptional errors that result from this process are unintentional.

## 2018-02-27 NOTE — Care Management Important Message (Signed)
Copy of signed IM left with patient in room.  

## 2018-02-27 NOTE — Consult Note (Signed)
[facility] Nurse wound consult note Patient evaluated in [facility] 225 in the presence of a female patient.  The patient's left great toe area and the left second toe are discolored a blackened color.  The discoloration extends to the dorsum of the foot.  The patient states she has two doctors that are managing the foot.  She states she is receiving all types of oral medication to promote healing, and that additional surgery is not an option. Reason for Consult: Left foot care recommendations Wound type: Appearance consistent with arterial insufficiency and progressing towards dry gangrene.  Per Dr. Gus Height Patel's note from 02/26/18 at 2:31 PM, "ischemia of the left lower extremity status post toe amputation". Drainage (amount, consistency, odor) No drainage, no odor, no induration Periwound: Discolored, warm Dressing procedure/placement/frequency: Please follow instructions provided by the surgical service performing amputation for the patient.  Generally speaking, a dressing will not restore insufficient blood flow to an area to promote healing. Monitor the wound area(s) for worsening of condition such as: Signs/symptoms of infection,  Increase in size,  Development of or worsening of odor, Development of pain, or increased pain at the affected locations.  Notify the medical team if any of these develop.  Thank you for the consult.  Discussed plan of care with the patient and bedside nurse.  [facility] nurse will not follow at this time.  Please re-consult the [facility] team if needed.  Val Riles, RN, MSN, CWOCN, CNS-BC, pager [phone]

## 2018-02-27 NOTE — Clinical Social Work Note (Signed)
Patient has not yet sat up for dialysis. Patient will not be able to be discharged tomorrow to [facility] unless she sits up for her dialysis session tomorrow and tolerates it.  Shela Leff MSW,LCSW [phone]

## 2018-02-28 LAB — RENAL FUNCTION PANEL
Albumin: 1.6 g/dL — ABNORMAL LOW (ref 3.5–5.0)
Anion gap: 11 (ref 5–15)
BUN: 24 mg/dL — ABNORMAL HIGH (ref 8–23)
CO2: 30 mmol/L (ref 22–32)
Calcium: 8 mg/dL — ABNORMAL LOW (ref 8.9–10.3)
Chloride: 98 mmol/L (ref 98–111)
Creatinine, Ser: 3.92 mg/dL — ABNORMAL HIGH (ref 0.44–1.00)
GFR calc Af Amer: 13 mL/min — ABNORMAL LOW (ref >60)
GFR calc non Af Amer: 11 mL/min — ABNORMAL LOW (ref >60)
Glucose, Bld: 118 mg/dL — ABNORMAL HIGH (ref 70–99)
Phosphorus: 1.6 mg/dL — ABNORMAL LOW (ref 2.5–4.6)
Potassium: 4.2 mmol/L (ref 3.5–5.1)
Sodium: 139 mmol/L (ref 135–145)

## 2018-02-28 LAB — GLUCOSE, CAPILLARY
Glucose-Capillary: 59 mg/dL — ABNORMAL LOW (ref 70–99)
Glucose-Capillary: 60 mg/dL — ABNORMAL LOW (ref 70–99)
Glucose-Capillary: 72 mg/dL (ref 70–99)
Glucose-Capillary: 92 mg/dL (ref 70–99)

## 2018-02-28 MED ORDER — DEXTROSE 50 % IV SOLN
INTRAVENOUS | Status: AC
  Filled 2018-02-28: qty 50

## 2018-02-28 MED ORDER — OXYCODONE HCL 5 MG PO TABS: 5.0000 mg | ORAL_TABLET | Freq: Four times a day (QID) | ORAL | 0 refills | Status: DC | PRN

## 2018-02-28 NOTE — Progress Notes (Signed)
[facility]  ROUNDING NOTE   Subjective:   Patient is Able to eat without nausea or vomiting Denies any acute complaints  Objective:  Vital signs in last 24 hours:  Temp:  [98.1 F (36.7 C)-98.9 F (37.2 C)] 98.9 F (37.2 C) (11/09 1120) Pulse Rate:  [63-70] 68 (11/09 1200) Resp:  [11-18] 11 (11/09 1200) BP: (137-165)/(51-60) 165/56 (11/09 1145) SpO2:  [99 %-100 %] 100 % (11/09 1120)  Weight change:  Filed Weights   02/25/18 1107 02/26/18 0905 02/26/18 1138  Weight: 64 kg 63.1 kg 63.3 kg    Intake/Output: I/O last 3 completed shifts: In: 421.9 [P.O.:360; IV Piggyback:61.9] Out: 0    Intake/Output this shift:  Total I/O In: 120 [P.O.:120] Out: -   Physical Exam: General: NAD  Head: Normocephalic, atraumatic. Moist oral mucosal membranes  Eyes: Anicteric,   Neck: Supple,   Lungs:  Clear to auscultation, oxygen supplementation  Heart: Regular rate    Abdomen:  Soft, nontender  Extremities:  left foot wound, right BKA  Neurologic:  Alert and oriented  Skin: No lesions  Access:  left IJ tunneled catheter    Basic Metabolic Panel: Recent Labs  Lab 02/24/18 0239 02/25/18 0530  NA 141 142  K 5.0 4.5  CL 103 99  CO2 29 33*  GLUCOSE 156* 214*  BUN 43* 24*  CREATININE 7.00* 4.18*  CALCIUM 8.8* 8.6*  MG  --  2.0  PHOS 2.2*  --     Liver Function Tests: Recent Labs  Lab 02/24/18 0239  ALBUMIN 1.9*   No results for input(s): LIPASE, AMYLASE in the last 168 hours. No results for input(s): AMMONIA in the last 168 hours.  CBC: Recent Labs  Lab 02/23/18 0615 02/24/18 0239 02/25/18 0530 02/26/18 0843 02/27/18 0958  WBC 16.6* 14.9* 14.2* 17.4* 22.8*  HGB 8.0* 8.0* 8.5* 6.9* 9.4*  HCT 27.3* 27.3* 29.0* 23.8* 31.6*  MCV 103.4* 104.2* 102.8* 107.2* 102.9*  PLT 365 369 380 338 335    Cardiac Enzymes: No results for input(s): CKTOTAL, CKMB, CKMBINDEX, TROPONINI in the last 168 hours.  BNP: Invalid input(s): POCBNP  CBG: Recent Labs   Lab 02/27/18 1641 02/27/18 2101 02/28/18 0746 02/28/18 0817 02/28/18 0848  GLUCAP 212* 109* 59* 60* 72    Microbiology: Results for orders placed or performed during the hospital encounter of 02/18/18  Blood Culture (routine x 2)     Status: None   Collection Time: 02/18/18  7:55 AM  Result Value Ref Range Status   Specimen Description BLOOD RIGHT [facility]  Final   Special Requests   Final    BOTTLES DRAWN AEROBIC AND ANAEROBIC Blood Culture results may not be optimal due to an excessive volume of blood received in culture bottles   Culture   Final    NO GROWTH 5 DAYS Performed at [facility], [address] [place], [place] [address]    Report Status 02/23/2018 FINAL  Final  Blood Culture (routine x 2)     Status: None   Collection Time: 02/18/18  7:55 AM  Result Value Ref Range Status   Specimen Description BLOOD RIGHT FA  Final   Special Requests   Final    BOTTLES DRAWN AEROBIC AND ANAEROBIC Blood Culture results may not be optimal due to an excessive volume of blood received in culture bottles   Culture   Final    NO GROWTH 5 DAYS Performed at [facility], [address] [place], [place] [address]  Report Status 02/23/2018 FINAL  Final    Coagulation Studies: No results for input(s): LABPROT, INR in the last 72 hours.  Urinalysis: No results for input(s): COLORURINE, LABSPEC, PHURINE, GLUCOSEU, HGBUR, BILIRUBINUR, KETONESUR, PROTEINUR, UROBILINOGEN, NITRITE, LEUKOCYTESUR in the last 72 hours.  Invalid input(s): APPERANCEUR    Imaging: No results found.   Medications:   . sodium chloride 250 mL (02/27/18 1700)  .  ceFAZolin (ANCEF) IV 1 g (02/27/18 1709)   . aspirin EC  81 mg Oral Daily  . atorvastatin  80 mg Oral QHS  . B-complex with vitamin C  1 tablet Oral Daily  . carvedilol  3.125 mg Oral BID WC  . dextrose      . docusate sodium  100 mg Oral BID  . epoetin (EPOGEN/PROCRIT) injection  10,000 Units Intravenous Q T,Th,Sa-HD  .  famotidine  10 mg Oral Daily  . feeding supplement (ENSURE ENLIVE)  237 mL Oral BID BM  . folic acid  1 mg Oral Daily  . insulin aspart  0-5 Units Subcutaneous QHS  . insulin aspart  0-9 Units Subcutaneous TID WC  . isosorbide mononitrate  30 mg Oral Daily  . midodrine  10 mg Oral Q T,Th,Sa-HD  . mirtazapine  7.5 mg Oral QHS  . multivitamin with minerals  1 tablet Oral Daily  . multivitamin-lutein  1 capsule Oral Daily  . olopatadine  1 drop Both Eyes BID  . omega-3 acid ethyl esters  1 capsule Oral Daily  . vitamin B-12  1,000 mcg Oral Daily   sodium chloride, acetaminophen **OR** acetaminophen, metoCLOPramide (REGLAN) injection, nitroGLYCERIN, ondansetron **OR** ondansetron (ZOFRAN) IV, oxyCODONE-acetaminophen, polyvinyl alcohol  Assessment/ Plan:  Stephanie Ellis is a 67 y.o. african [place] female with end stage renal disease on peritoneal dialysis, hypertension, congestive heart failure, coronary artery disease status post CABG, diabetes mellitus type II insulin dependent, GERD, peripheral vascular disease admitted to [facility] on 01/29/2018 for leg pain.    [facility] Nephrology [place] TTS hemodialysis RIJ permcath Transitioned from PD to HD this month  1. End Stage Renal Disease with hyperkalemia: transitioned from PD to hemodialysis.  - PD catheter removed  - Hemodialysis TTS schedule.  D/c planning to [facility] -Routine hemodialysis today.  Plan to dialyze seated in chair  2.  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 Anemia of chronic kidney disease: status post PRBC transfusions during this admission - EPO 10000 units with hemodialysis treatment.  -Blood transfusion given with hemodialysis 11/7  4. Hypotension:   - Midodrine before dialysis.   5. Secondary Hyperparathyroidism:  - Recent Phos is low; will hold renvela  Stephanie Ellis, [facility]   11/9/201912:17 PM

## 2018-02-28 NOTE — Progress Notes (Signed)
OT Cancellation Note  Patient Details Name: Stephanie Ellis MRN: [id number] DOB: 1950/07/28   Cancelled Treatment:    Reason Eval/Treat Not Completed: Patient at procedure or test/ unavailable Pt off floor at HD when checked on x2 but OT for evaluation, will f/u this afternoon as time permits.   Gerrianne Scale, MS, OTR/L ascom [phone] or [phone] 02/28/18, 12:45 PM

## 2018-02-28 NOTE — Progress Notes (Signed)
RN and NT helped pt transfer to wheelchair for dialysis. Report giving to charlie, Therapist, sports. Pt will be transferred to dialysis chair in dialysis.   Yoland Scherr [employer]

## 2018-02-28 NOTE — Progress Notes (Signed)
This note also relates to the following rows which could not be included: Pulse Rate - Cannot attach notes to unvalidated device data Resp - Cannot attach notes to unvalidated device data SpO2 - Cannot attach notes to unvalidated device data Hd started

## 2018-02-28 NOTE — Discharge Instructions (Signed)
Please change the below the knee amputation dressing daily or sooner if drainage is noted. Please keep stump clean and dry. The skin is closed with sutures under the skin that do not need to be removed. Cover with Kerlix then Coban or Ace Bandage. If is OK to leave open to air when the patient is not active or participating in physical therapy. Please encourage keeping the knee joint straight. If the knee joint contracts the patient will not be able to walk with a prosthetic.  OK for patient to shower. Do not bath or submerge in water until cleared by her physician.   Wound Care Recommendations Please apply Silvadene to left foot daily Please apply bunny boat to left foot to off load pressure.   Resume hemodialysis Tuesday Thursday Saturday

## 2018-02-28 NOTE — Clinical Social Work Note (Addendum)
The CSW visited the patient at bedside to discuss imminent discharge. The patient agreed with plan to discharge via non-emergent EMS back to [facility]. The CSW contacted the patient's daughter, [name], who also agrees. [name] requested that the discharge summary include wound care/dressing change directions as the last discharge summary did not which caused an issue at the SNF. The CSW has updated the attending MD of this need and will resend the updated dc summary when available. The CSW has delivered the discharge packet and will sign off once the updated discharge summary has been provided to the facility. Please consult should needs change.  UPDATE: CSW has sent note with wound care instructions to the facility and has sent a copy via tube system to the RN to include in discharge packet. The CSW updated RN.  Santiago Bumpers, MSW, Latanya Presser [phone]

## 2018-02-28 NOTE — Discharge Summary (Signed)
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 (HFpEF) heart failure with preserved ejection fraction ([place])    a. 01/2018 Echo: EF 50-55%, no rwma, Gr1 DD, triv AI, mild MR. Midly dil LA/RV, mod reduced RV fxn. Irreg thickening of TV w/ mobile echodensity that appears to arise from valve.  . Anemia   . Anorexia   . Bacteremia due to Pseudomonas   . Carotid arterial disease ([place])    a. 01/2017 Carotid U/S: RICA <34, LICA <19.  Marland Kitchen Complication of anesthesia    a. Pt reports h/o complication on 9 different occasions - ? hypotension/arrest.  . 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 01/2015 Cath [facility]): 70% LM, p-mLAD 50-60% (Resting FFR 0.75), mRCA 80-90%, ~40 Ost OM & D1-->CABG; b. 01/2016 Staged PCI of LCX x 2 and RCA.  Marland Kitchen ESRD (end stage renal disease) on dialysis ([place])    a. ESRD secondary to acute kidney failure s/p CABG-->PD  . Essential hypertension   . GERD (gastroesophageal reflux disease)   . Heart murmur   . Hypercholesterolemia   . Myocardial infarction ([place]) 2016  . PVD (peripheral vascular disease) ([place])    a. 01/30/2018 PV Angio: Sev Left Popliteal dzs s/p PTA w/ 85m lutonix DEB w/ mech aspiration of the L popliteal, L AT, and Left tibioperoneal trunck and peroneal artery.  . 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  [facility]: Dr. BMarland KitchenHaithcock: CABG x 3, LIMA to LAD, SVG to RCA,  SVG to OM3, EVH  . Sinusitis 2019  . Type II diabetes mellitus with complication (HCC)    CAD    HOSPITAL COURSE:  DeniseVinsonis a66 y.o.femalewith a known history of recent tricuspid endocarditis and ischemia of the left lower extremity status post toe amputation. She was sent out to rehab on Eliquis. She was brought back in with ischemia of the right foot and toes and also ischemia on the left foot and toe.  *Bilateral lower extremity gangrene- s/p angiogram of RLE this morning with stents placed, also s/p left great toe amputation at last hospitalization. -s/p right BKA on 02/26/2018 - d/ced vanc and cefepime - pt received IV  heparin drip -discussed with Dr. DLucky Cowboyand Dr. FVickki Muffno further plans for any workup on left lower extremity -no fever, vitals stable Wbc some reactive with recent BKA -resumed Eliquis  *Recent history of tricuspid endocarditis- s/p septic embolization treated with revascularization -Continue Cefazolin till 03/13/2018 at Hemodialysis  *ESRD on HD TTS -Nephrology consulted -pt needs to be sitting in the chair for HD--will try that tomorrow and then d/c to LC  * Anemia of chronic disease hemoglobin 6.9--s/p Blood transfusion --9.4  *T2DM- blood sugars well-controlled -cont SSI -sugars dropping intermittently--Lantus discontinued  * Elevated troponin with history of CAD s/p CABG- stable, no active chest pain. Troponin likely elevated due to reduced renal clearance. -Continue aspirin  * Chronic diastolic CHF- no  signs of volume overload -fluid management per nephro  *Left eye congestion with blurred vision - daughter to call [facility] to see if patient is able to get that shot in the eye by ophthalmologist in 1 week after discharge  Patient is a long-term resident at [facility]. Will discharged tomorrow after HD if she remains stable.pt agreeable   CONSULTS OBTAINED:  Treatment Team:  Algernon Huxley, MD [place],  Arcola Jansky, MD Murlean Iba, MD Marchia Meiers, MD Sharren Bridge, OT  DRUG ALLERGIES:   Allergies  Allergen Reactions  . Chlorthalidone Anaphylaxis, Itching and Rash  . Fentanyl Rash  . Midazolam Rash  . Ace Inhibitors Other (See Comments)    Reaction:  Hyperkalemia, agitation   . Angiotensin Receptor Blockers Other (See Comments)    Reaction:  Hyperkalemia, agitation   . Norvasc [Amlodipine] Itching and Rash  . Phenergan [Promethazine Hcl] Anxiety    "antsy, can't sit still"    DISCHARGE MEDICATIONS:   Allergies as of 02/28/2018      Reactions   Chlorthalidone Anaphylaxis, Itching, Rash   Fentanyl Rash   Midazolam Rash   Ace Inhibitors Other (See Comments)   Reaction:  Hyperkalemia, agitation    Angiotensin Receptor Blockers Other (See Comments)   Reaction:  Hyperkalemia, agitation    Norvasc [amlodipine] Itching, Rash   Phenergan [promethazine Hcl] Anxiety   "antsy, can't sit still"      Medication List    STOP taking these medications   insulin glargine 100 unit/mL Sopn Commonly known as:  LANTUS     TAKE these medications   apixaban 2.5 MG Tabs tablet Commonly known as:  ELIQUIS Take 1 tablet (2.5 mg total) by mouth 2 (two) times daily.   aspirin EC 81 MG tablet Take 1 tablet (81 mg total) by mouth daily.   atorvastatin 80 MG tablet Commonly known as:  LIPITOR Take 80 mg by mouth at bedtime.   calcitRIOL 0.5 MCG capsule Commonly known as:  ROCALTROL Take 0.5 mcg by mouth daily.   carvedilol 3.125 MG tablet Commonly known as:  COREG Take 3.125 mg by mouth 2 (two) times daily.   ceFAZolin  IVPB Commonly known as:  ANCEF Indication:  Osteomyelitis/Tricuspic Endocarditis Last Day of Therapy:  03/13/18 Labs - Once weekly:  CBC/D and CMP, What changed:  Another medication with the same name was removed. Continue taking this medication, and follow the directions you see here.   docusate sodium 100 MG capsule Commonly  known as:  COLACE Take 1 capsule (100 mg total) by mouth 2 (two) times daily.   feeding supplement (NEPRO CARB STEADY) Liqd Take 237 mLs by mouth 2 (two) times daily between meals.   folic acid 1 MG tablet Commonly known as:  FOLVITE Take 1 tablet (1 mg total) by mouth daily.   isosorbide mononitrate 30 MG 24 hr tablet Commonly known as:  IMDUR Take 30 mg by mouth daily.   midodrine 10 MG tablet Commonly known as:  PROAMATINE Take 1 tablet (10 mg total) by mouth 3 (three) times a week. What changed:  when to take this   mirtazapine 7.5 MG tablet Commonly known as:  REMERON Take 7.5 mg by mouth at bedtime.   multivitamin with minerals Tabs tablet Take 2 tablets by mouth daily. Gummy vitamins   nitroGLYCERIN 0.4 MG SL tablet Commonly known as:  NITROSTAT Place 1 tablet (0.4 mg total) under the tongue every 5 (five) minutes as needed for chest pain.  nizatidine 150 MG capsule Commonly known as:  AXID Take 1 capsule (150 mg total) by mouth 2 (two) times daily.   Omega-3 300 MG Caps Take 2 capsules by mouth daily.   ondansetron 4 MG tablet Commonly known as:  ZOFRAN Take 4 mg by mouth every 8 (eight) hours as needed for nausea or vomiting.   oxyCODONE 5 MG immediate release tablet Commonly known as:  Oxy IR/ROXICODONE Take 1 tablet (5 mg total) by mouth every 6 (six) hours as needed for moderate pain or severe pain.   PRORENAL + D Tabs Take 1 tablet by mouth daily.   sevelamer carbonate 800 MG tablet Commonly known as:  RENVELA Take 800 mg by mouth 3 (three) times daily with meals.   vitamin B-12 1000 MCG tablet Commonly known as:  CYANOCOBALAMIN Take 1 tablet (1,000 mcg total) by mouth daily.       If you experience worsening of your admission symptoms, develop shortness of breath, life threatening emergency, suicidal or homicidal thoughts you must seek medical attention immediately by calling 911 or calling your MD immediately  if symptoms less severe.  You  Must read complete instructions/literature along with all the possible adverse reactions/side effects for all the Medicines you take and that have been prescribed to you. Take any new Medicines after you have completely understood and accept all the possible adverse reactions/side effects.   Please note  You were cared for by a hospitalist during your hospital stay. If you have any questions about your discharge medications or the care you received while you were in the hospital after you are discharged, you can call the unit and asked to speak with the hospitalist on call if the hospitalist that took care of you is not available. Once you are discharged, your primary care physician will handle any further medical issues. Please note that NO REFILLS for any discharge medications will be authorized once you are discharged, as it is imperative that you return to your primary care physician (or establish a relationship with a primary care physician if you do not have one) for your aftercare needs so that they can reassess your need for medications and monitor your lab values. Today   SUBJECTIVE   Doing well. Sugars a bit low. Mentation back to baseline  VITAL SIGNS:  Blood pressure (!) 148/51, pulse 63, temperature 98.5 F (36.9 C), temperature source Oral, resp. rate 16, height 5' 4"  (1.626 m), weight 63.3 kg, SpO2 100 %.  I/O:    Intake/Output Summary (Last 24 hours) at 02/28/2018 0804 Last data filed at 02/28/2018 0645 Gross per 24 hour  Intake 360 ml  Output 0 ml  Net 360 ml    PHYSICAL EXAMINATION:  GENERAL:67 y.o.-year-old patient lying in the bed with no acute distress. Appears chronically ill EYES: Pupils equal, round, reactive to light and accommodation. No scleral icterus. Extraocular muscles intact.  HEENT: Head atraumatic, normocephalic. Oropharynx and nasopharynx clear. Left eye  congestion NECK: Supple, no jugular venous distention. No thyroid enlargement, no tenderness.   LUNGS: Normal breath sounds bilaterally, no wheezing, rales,rhonchi or crepitation. No use of accessory muscles of respiration.  CARDIOVASCULAR: RRR, S1, S2 normal. No murmurs, rubs, or gallops.  ABDOMEN: Soft, nontender, nondistended. Bowel sounds present. No organomegaly or mass.  EXTREMITIES: . +left great toe amputation with some dorsal necrosis present--dressing+. No foul odor Right BKA stump+ with dressing NEUROLOGIC: Cranial nerves II through XII are intact. gen weakness Sensation intact. Gait not checked.  PSYCHIATRIC: patient  is alert and oriented x 2.  SKIN:see above  DATA REVIEW:   CBC  Recent Labs  Lab 02/27/18 0958  WBC 22.8*  HGB 9.4*  HCT 31.6*  PLT 335    Chemistries  Recent Labs  Lab 02/25/18 0530  NA 142  K 4.5  CL 99  CO2 33*  GLUCOSE 214*  BUN 24*  CREATININE 4.18*  CALCIUM 8.6*  MG 2.0    Microbiology Results   No results found for this or any previous visit (from the past 240 hour(s)).  RADIOLOGY:  No results found.   Management plans discussed with the patient, family and they are in agreement.  CODE STATUS:     Code Status Orders  (From admission, onward)         Start     Ordered   02/18/18 1000  Full code  Continuous     02/18/18 1000        Code Status History    Date Active Date Inactive Code Status Order ID Comments User Context   02/01/2018 0412 02/15/2018 2250 Full Code [id number]  Arta Silence, MD Inpatient   01/31/2018 2300 02/01/2018 0412 DNR [id number]  Amelia Jo, MD Inpatient   01/31/2018 2037 01/31/2018 2300 DNR [id number]  Awilda Bill, NP Inpatient   01/29/2018 2250 01/31/2018 2037 Full Code [id number]  Amelia Jo, MD Inpatient   10/24/2016 1055 10/25/2016 2243 Full Code [id number]  Hillary Bow, MD ED   10/17/2016 2143 10/19/2016 1440 Full Code [id number]  Vaughan Basta, MD Inpatient   09/04/2016 1708 09/05/2016 1500 Full Code [id number]  Epifanio Lesches, MD ED   08/22/2016 0938 08/22/2016  1833 Full Code [id number]  Algernon Huxley, MD Inpatient   06/27/2016 1121 06/27/2016 1846 Full Code [id number]  Algernon Huxley, MD Inpatient   10/03/2015 2235 10/04/2015 1724 Full Code [id number]  Harrie Foreman, MD Inpatient      TOTAL TIME TAKING CARE OF THIS PATIENT: *40* minutes.    Fritzi Mandes M.D on 02/28/2018 at 8:04 AM  Between 7am to 6pm - Pager - [phone] After 6pm go to [email or web address] - password [email or web address] [facility]  Office  [phone]  CC: Primary care physician; System, Pcp Not In

## 2018-02-28 NOTE — Progress Notes (Signed)
This note also relates to the following rows which could not be included: Pulse Rate - Cannot attach notes to unvalidated device data Resp - Cannot attach notes to unvalidated device data BP - Cannot attach notes to unvalidated device data  Hd copleted

## 2018-02-28 NOTE — Progress Notes (Signed)
PT Cancellation Note  Patient Details Name: Boston Catarino MRN: [id number] DOB: 10-06-1950   Cancelled Treatment:    Reason Eval/Treat Not Completed: Patient declined, no reason specified. Pt out of room for hemodialysis. Re attempt when pt available.    Larae Grooms, PTA 02/28/2018, 12:02 PM

## 2018-02-28 NOTE — Progress Notes (Signed)
Pt prepared for d/c to SNF. IV d/c'd. Skin intact except as charted in most recent assessments. Vitals are stable. Report called to receiving facility. Wound dressing changes went over with Levada Dy from [facility]. Pt to be transported by ambulance service.  Stephanie Ellis [employer]

## 2018-03-02 DIAGNOSIS — Z89511 Acquired absence of right leg below knee: Secondary | ICD-10-CM | POA: Insufficient documentation

## 2018-03-06 ENCOUNTER — Other Ambulatory Visit: Payer: Self-pay

## 2018-03-06 ENCOUNTER — Emergency Department
Admission: EM | Admit: 2018-03-06 | Discharge: 2018-03-06 | Disposition: A | Discharge status: Skilled Nursing Facility | Payer: Medicare Other | Source: Home / Self Care | Attending: Emergency Medicine | Admitting: Emergency Medicine

## 2018-03-06 ENCOUNTER — Telehealth (INDEPENDENT_AMBULATORY_CARE_PROVIDER_SITE_OTHER): Payer: Self-pay

## 2018-03-06 DIAGNOSIS — E1122 Type 2 diabetes mellitus with diabetic chronic kidney disease: Secondary | ICD-10-CM

## 2018-03-06 DIAGNOSIS — N186 End stage renal disease: Secondary | ICD-10-CM

## 2018-03-06 DIAGNOSIS — I96 Gangrene, not elsewhere classified: Secondary | ICD-10-CM | POA: Insufficient documentation

## 2018-03-06 DIAGNOSIS — I120 Hypertensive chronic kidney disease with stage 5 chronic kidney disease or end stage renal disease: Secondary | ICD-10-CM | POA: Insufficient documentation

## 2018-03-06 DIAGNOSIS — I2510 Atherosclerotic heart disease of native coronary artery without angina pectoris: Secondary | ICD-10-CM | POA: Insufficient documentation

## 2018-03-06 DIAGNOSIS — Z7982 Long term (current) use of aspirin: Secondary | ICD-10-CM

## 2018-03-06 DIAGNOSIS — I160 Hypertensive urgency: Secondary | ICD-10-CM | POA: Diagnosis not present

## 2018-03-06 DIAGNOSIS — Y69 Unspecified misadventure during surgical and medical care: Secondary | ICD-10-CM

## 2018-03-06 DIAGNOSIS — I252 Old myocardial infarction: Secondary | ICD-10-CM

## 2018-03-06 DIAGNOSIS — Z79899 Other long term (current) drug therapy: Secondary | ICD-10-CM

## 2018-03-06 DIAGNOSIS — Z794 Long term (current) use of insulin: Secondary | ICD-10-CM | POA: Insufficient documentation

## 2018-03-06 DIAGNOSIS — Z951 Presence of aortocoronary bypass graft: Secondary | ICD-10-CM

## 2018-03-06 DIAGNOSIS — T8130XA Disruption of wound, unspecified, initial encounter: Secondary | ICD-10-CM

## 2018-03-06 DIAGNOSIS — Z7901 Long term (current) use of anticoagulants: Secondary | ICD-10-CM

## 2018-03-06 NOTE — ED Triage Notes (Signed)
Per [facility], pt had her left big toe amputated 1 1/2 weeks ago.  Sent here for a wound check, concerns that stitches may be out of the area that was amputated.

## 2018-03-06 NOTE — ED Provider Notes (Signed)
[facility] Emergency Department Provider Note  ____________________________________________  Time seen: Approximately 11:25 AM  I have reviewed the triage vital signs and the nursing notes.   HISTORY  Chief Complaint Wound Check   HPI Stephanie Ellis is a 67 y.o. 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  This morning at the nursing home patient was noted to have 2 stitches loose and the wound has dehisced at the surgical bed of the L big toe amputation.  Patient has had no pain, there is no purulent discharge, no fever or chills.  Past Medical History:  Diagnosis Date  . (HFpEF) heart failure with preserved ejection fraction ([place])    a. 01/2018 Echo: EF 50-55%, no rwma, Gr1 DD, triv AI, mild MR. Midly dil LA/RV, mod reduced RV fxn. Irreg thickening of TV w/ mobile echodensity that appears to arise from valve.  . Anemia   . Anorexia   . Bacteremia due to Pseudomonas   . Carotid arterial disease ([place])    a. 01/2017 Carotid U/S: RICA <67, LICA <89.  Marland Kitchen Complication of anesthesia    a. Pt reports h/o complication on 9 different occasions - ? hypotension/arrest.  . 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 01/2015 Cath [facility]): 70% LM, p-mLAD 50-60% (Resting FFR 0.75), mRCA 80-90%, ~40 Ost OM & D1-->CABG; b. 01/2016 Staged PCI of LCX x 2 and RCA.  Marland Kitchen ESRD (end stage renal disease) on dialysis ([place])    a. ESRD secondary to acute kidney failure s/p CABG-->PD  . Essential hypertension   . GERD (gastroesophageal reflux disease)   . Heart murmur   . Hypercholesterolemia   . Myocardial infarction ([place]) 2016  . PVD (peripheral vascular disease) ([place])    a. 01/30/2018 PV Angio: Sev Left Popliteal dzs s/p PTA w/ 51m lutonix DEB w/ mech aspiration of the L  popliteal, L AT, and Left tibioperoneal trunck and peroneal artery.  . 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  [facility]: Dr. BMarland KitchenHaithcock: CABG x 3, LIMA to LAD, SVG to RCA, SVG to OM3, EVH  . Sinusitis 2019  . Type II diabetes mellitus with complication ([facility]    CAD    Patient Active Problem List   Diagnosis Date Noted  . Ischemia of extremity 02/18/2018  . ESRD (end stage renal disease) ([place]   . Diabetic osteomyelitis ([place]   . Peripheral vascular disease ([place]   . Advanced care planning/counseling discussion   . Palliative care by specialist   . Goals of care, counseling/discussion   . Diabetes mellitus, type 2 ([place] 01/30/2018  . Sepsis ([place] 01/29/2018  . Peripheral neuropathy 06/17/2017  . Encephalopathy 02/06/2017  . Hypertensive emergency 02/06/2017  . Endophthalmitis, left eye 12/05/2016  . Vomiting 10/24/2016  . Hematuria 10/17/2016  . Acute lower UTI 10/17/2016  . Anesthesia complication 038/01/1750 . Chest pain with moderate risk for cardiac etiology 09/04/2016  . Steal syndrome dialysis vascular access, initial encounter ([place] 06/18/2016  . 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 Coronary artery disease involving coronary bypass graft of native heart with angina pectoris ([place] 02/10/2016  . Hypertension 02/02/2016  . Hyperlipidemia 02/02/2016  . Coronary artery disease involving left main coronary artery 10/04/2015  . Abdominal pain of unknown etiology 10/04/2015  . ESRD (end stage renal disease) on dialysis ([place]   .  Type II diabetes mellitus with complication ([place])   . NSTEMI (non-ST elevated myocardial infarction) ([place])   . Right sided abdominal pain   . Elevated troponin 10/03/2015  . 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 Chronic diastolic congestive heart failure ([place]) 04/19/2014  . Stage 5 chronic kidney disease on chronic dialysis ([place]) 04/19/2014  . Anemia 09/20/2013  . Routine health maintenance 01/29/2013    Past Surgical History:  Procedure  Laterality Date  . ABDOMINAL HYSTERECTOMY    . AMPUTATION Right 02/25/2018   Procedure: AMPUTATION BELOW KNEE;  Surgeon: Katha Cabal, MD;  Location: [facility] ORS;  Service: Vascular;  Laterality: Right;  . AMPUTATION TOE Left 02/06/2018   Procedure: AMPUTATION GREAT TOE;  Surgeon: Samara Deist, DPM;  Location: [facility] ORS;  Service: Podiatry;  Laterality: Left;  . ARTERIOVENOUS GRAFT PLACEMENT  05/2016  . AV FISTULA PLACEMENT Left 05/30/2016   Procedure: ARTERIOVENOUS graft;  Surgeon: Algernon Huxley, MD;  Location: [facility] ORS;  Service: Vascular;  Laterality: Left;  . CAPD INSERTION N/A 10/30/2017   Procedure: LAPAROSCOPIC INSERTION CONTINUOUS AMBULATORY PERITONEAL DIALYSIS  (CAPD) CATHETER;  Surgeon: Algernon Huxley, MD;  Location: [facility] ORS;  Service: Vascular;  Laterality: N/A;  . CARDIAC CATHETERIZATION  01/2015   [facility]: Ost LM 70%, p-m LAD 50-60% (Rest FFR + @ 0.75), mRCA 80-90%, ostD1 40%, pOM1 40%  . CATARACT EXTRACTION W/PHACO Left 01/18/2016   Procedure: CATARACT EXTRACTION PHACO AND INTRAOCULAR LENS PLACEMENT (IOC);  Surgeon: Eulogio Bear, MD;  Location: [facility] ORS;  Service: Ophthalmology;  Laterality: Left;  [place] 1.05 AP% 15.5 CDE 10.16 Fluid Pack Lot # [id number]  . CATARACT EXTRACTION W/PHACO Right 08/01/2016   Procedure: CATARACT EXTRACTION PHACO AND INTRAOCULAR LENS PLACEMENT (IOC);  Surgeon: Eulogio Bear, MD;  Location: [facility] ORS;  Service: Ophthalmology;  Laterality: Right;  [place] 01:00.6 AP% 11.4 CDE 6.93  LOT # [id number]  . COLONOSCOPY    . CORONARY ANGIOPLASTY     SENTS 02/12/16  . CORONARY ARTERY BYPASS GRAFT  03/28/15    [facility]: Dr. Waldemar Dickens: LIMA to LAD, SVG to RCA, SVG to OM3, EVH  . DIALYSIS/PERMA CATHETER INSERTION N/A 02/11/2018   Procedure: DIALYSIS/PERMA CATHETER INSERTION;  Surgeon: Algernon Huxley, MD;  Location: [place] CV LAB;  Service: Cardiovascular;  Laterality: N/A;  . DIALYSIS/PERMA CATHETER REMOVAL Right 02/04/2018   Procedure: DIALYSIS/PERMA CATHETER  REMOVAL;  Surgeon: Katha Cabal, MD;  Location: [place] CV LAB;  Service: Cardiovascular;  Laterality: Right;  . ESOPHAGOGASTRODUODENOSCOPY (EGD) WITH PROPOFOL N/A 10/24/2016   Procedure: ESOPHAGOGASTRODUODENOSCOPY (EGD) WITH PROPOFOL;  Surgeon: [name], MD;  Location: [facility] ENDOSCOPY;  Service: Endoscopy;  Laterality: N/A;  . EYE SURGERY Bilateral    cataract surgery  . EYE SURGERY     drains for glaucoma  . INSERTION EXPRESS TUBE SHUNT Right 08/01/2016   Procedure: INSERTION EXPRESS TUBE SHUNT;  Surgeon: Eulogio Bear, MD;  Location: [facility];  Service: Ophthalmology;  Laterality: Right;  . INSERTION OF AHMED VALVE Left 08/15/2016   Procedure: INSERTION OF AHMED VALVE;  Surgeon: Eulogio Bear, MD;  Location: [facility] OR[facility];  Service: Ophthalmology;  Laterality: Left;  . INSERTION OF DIALYSIS CATHETER    . LOWER EXTREMITY ANGIOGRAPHY Left 01/30/2018   Procedure: Lower Extremity Angiography;  Surgeon: Katha Cabal, MD;  Location: [place] CV LAB;  Service: Cardiovascular;  Laterality: Left;  . LOWER EXTREMITY ANGIOGRAPHY Right 02/19/2018   Procedure: Lower Extremity Angiography;  Surgeon: Algernon Huxley, MD;  Location: [facility]  INVASIVE CV LAB;  Service: Cardiovascular;  Laterality: Right;  . REMOVAL OF A DIALYSIS CATHETER N/A 02/25/2018   Procedure: REMOVAL OF A DIALYSIS CATHETER;  Surgeon: Katha Cabal, MD;  Location: [facility] ORS;  Service: Vascular;  Laterality: N/A;  . TEMPORARY DIALYSIS CATHETER N/A 02/06/2018   Procedure: TEMPORARY DIALYSIS CATHETER;  Surgeon: Katha Cabal, MD;  Location: [place] CV LAB;  Service: Cardiovascular;  Laterality: N/A;  . TRANSTHORACIC ECHOCARDIOGRAM  January 2017    EF 60-65%. GR 2 DD. Mild degenerative mitral valve disease but no prolapse or regurgitation. Mild left atrial dilation. Mild to moderate LVH. Pericardial effusion gone  . UPPER EXTREMITY ANGIOGRAPHY Left 06/27/2016   Procedure: Upper Extremity Angiography;  Surgeon:  Algernon Huxley, MD;  Location: [place] CV LAB;  Service: Cardiovascular;  Laterality: Left;  . UPPER EXTREMITY ANGIOGRAPHY Left 08/22/2016   Procedure: Upper Extremity Angiography;  Surgeon: Algernon Huxley, MD;  Location: [place] CV LAB;  Service: Cardiovascular;  Laterality: Left;    Prior to Admission medications   Medication Sig Start Date End Date Taking? Authorizing Provider  apixaban (ELIQUIS) 2.5 MG TABS tablet Take 1 tablet (2.5 mg total) by mouth 2 (two) times daily. 02/15/18  Yes Vaughan Basta, MD  aspirin EC 81 MG tablet Take 1 tablet (81 mg total) by mouth daily. 10/19/16  Yes Dustin Flock, MD  atorvastatin (LIPITOR) 80 MG tablet Take 80 mg by mouth at bedtime.  05/05/17  Yes [provider]  calcitRIOL (ROCALTROL) 0.5 MCG capsule Take 0.5 mcg by mouth daily.   Yes [provider]  carvedilol (COREG) 3.125 MG tablet Take 3.125 mg by mouth 2 (two) times daily.    Yes [provider]  docusate sodium (COLACE) 100 MG capsule Take 1 capsule (100 mg total) by mouth 2 (two) times daily. 02/15/18  Yes Vaughan Basta, MD  folic acid (FOLVITE) 1 MG tablet Take 1 tablet (1 mg total) by mouth daily. 02/15/18 02/15/19 Yes Vaughan Basta, MD  insulin glargine (LANTUS) 100 unit/mL SOPN Inject 5 Units into the skin at bedtime.   Yes [provider]  isosorbide mononitrate (IMDUR) 30 MG 24 hr tablet Take 30 mg by mouth daily.    Yes [provider]  midodrine (PROAMATINE) 10 MG tablet Take 1 tablet (10 mg total) by mouth 3 (three) times a week. Patient taking differently: Take 10 mg by mouth 3 (three) times a week. Take Tuesday, Thursday, and Saturday 02/17/18  Yes Vaughan Basta, MD  mirtazapine (REMERON) 7.5 MG tablet Take 7.5 mg by mouth at bedtime.   Yes [provider]  Multiple Vitamin (MULTIVITAMIN WITH MINERALS) TABS tablet Take 2 tablets by mouth daily. Gummy vitamins   Yes [provider]    Multiple Vitamins-Minerals (PRORENAL + D) TABS Take 1 tablet by mouth daily.   Yes [provider]  nitroGLYCERIN (NITROSTAT) 0.4 MG SL tablet Place 1 tablet (0.4 mg total) under the tongue every 5 (five) minutes as needed for chest pain. 09/05/16  Yes Wieting, Richard, MD  nizatidine (AXID) 150 MG capsule Take 1 capsule (150 mg total) by mouth 2 (two) times daily. 02/15/18  Yes Vaughan Basta, MD  Nutritional Supplements (FEEDING SUPPLEMENT, NEPRO CARB STEADY,) LIQD Take 237 mLs by mouth 2 (two) times daily between meals. 02/15/18  Yes Vaughan Basta, MD  Omega-3 300 MG CAPS Take 2 capsules by mouth daily.   Yes [provider]  ondansetron (ZOFRAN) 4 MG tablet Take 4 mg by mouth  every 8 (eight) hours as needed for nausea or vomiting.   Yes [provider]  oxyCODONE (OXY IR/ROXICODONE) 5 MG immediate release tablet Take 1 tablet (5 mg total) by mouth every 6 (six) hours as needed for moderate pain or severe pain. 02/28/18  Yes Fritzi Mandes, MD  Polyethyl Glycol-Propyl Glycol 0.4-0.3 % SOLN Place 1 drop into both eyes 2 (two) times daily.   Yes [provider]  sevelamer carbonate (RENVELA) 800 MG tablet Take 800 mg by mouth 3 (three) times daily with meals.   Yes [provider]  vitamin B-12 (CYANOCOBALAMIN) 1000 MCG tablet Take 1 tablet (1,000 mcg total) by mouth daily. Patient taking differently: Take 500 mcg by mouth daily.  02/15/18  Yes Vaughan Basta, MD  ceFAZolin (ANCEF) IVPB Indication:  Osteomyelitis/Tricuspic Endocarditis Last Day of Therapy:  03/13/18 Labs - Once weekly:  CBC/D and CMP, 02/17/18 03/13/18  Vaughan Basta, MD    Allergies Chlorthalidone; Fentanyl; Midazolam; Ace inhibitors; Angiotensin receptor blockers; Norvasc [amlodipine]; and Phenergan [promethazine hcl]  Family History  Problem Relation Age of Onset  . Diabetes Mellitus II Mother   . Heart failure Mother   . Pancreatic cancer Father      Social History Social History   Tobacco Use  . Smoking status: Never Smoker  . Smokeless tobacco: Never Used  Substance Use Topics  . Alcohol use: No  . Drug use: No    Review of Systems  Constitutional: Negative for fever. Cardiovascular: Negative for chest pain. Respiratory: Negative for shortness of breath. Gastrointestinal: Negative for abdominal pain, vomiting or diarrhea. Genitourinary: Negative for dysuria. Musculoskeletal: Negative for back pain. Skin: Negative for rash. + wound dehiscence  Neurological: Negative for headaches, weakness or numbness. Psych: No SI or HI  ____________________________________________   PHYSICAL EXAM:  VITAL SIGNS: ED Triage Vitals  Enc Vitals Group     BP 03/06/18 1040 (!) 185/68     Pulse Rate 03/06/18 1040 69     Resp 03/06/18 1040 16     Temp 03/06/18 1040 98.7 F (37.1 C)     Temp Source 03/06/18 1040 Oral     SpO2 03/06/18 1040 96 %     Weight 03/06/18 1041 130 lb (59 kg)     Height 03/06/18 1041 5' 4"  (1.626 m)     Head Circumference --      Peak Flow --      Pain Score 03/06/18 1040 0     Pain Loc --      Pain Edu? --      Excl. in [place]? --     Constitutional: Alert and oriented. Well appearing and in no apparent distress. HEENT:      Head: Normocephalic and atraumatic.         Eyes: Conjunctivae are normal. Sclera is non-icteric.       Mouth/Throat: Mucous membranes are moist.       Neck: Supple with no signs of meningismus. Cardiovascular: Regular rate and rhythm. No murmurs, gallops, or rubs. 2+ symmetrical distal pulses are present in all extremities. No JVD. Respiratory: Normal respiratory effort. Lungs are clear to auscultation bilaterally. No wheezes, crackles, or rhonchi.  Gastrointestinal: Soft, non tender, and non distended with positive bowel sounds. No rebound or guarding. Musculoskeletal: R BKA, necrotic L foot Neurologic: Normal speech and language. Face is symmetric. Moving all extremities. No  gross focal neurologic deficits are appreciated. Skin: Two loose stitches at the surgical bed of the L big toe with dehiscence of  the wound Psychiatric: Mood and affect are normal. Speech and behavior are normal.  ____________________________________________   LABS (all labs ordered are listed, but only abnormal results are displayed)  Labs Reviewed - No data to display ____________________________________________  EKG  none  ____________________________________________  RADIOLOGY  none  ____________________________________________   PROCEDURES  Procedure(s) performed: None Procedures Critical Care performed:  None ____________________________________________   INITIAL IMPRESSION / ASSESSMENT AND PLAN / ED COURSE  67 y.o. 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 Discussed with Dr. Cleda Mccreedy for evaluation in the ED.    _________________________ 1:08 PM on 03/06/2018 -----------------------------------------  Dr. Cleda Mccreedy evaluated patient's foot in the emergency room, removed all the other stitches and dressed the foot.  He recommends that foot be amputated in the near future.  No need for admission at this time.  I also spoke with Dr. Lucky Cowboy who is on-call for vascular surgery and he agrees that foot needs to be amputated in the near future.  He recommended follow-up appointment in the office within a week to have the surgery scheduled.  In the meantime keep dry dressing as left by Dr. Caryl Comes.      As part of my medical decision making, I reviewed the following data within the [facility] notes reviewed and incorporated, A consult was requested and obtained from this/these consultant(s) Vascular surgery and Podiatry, Notes from prior ED visits and [place] Controlled Substance  Database    Pertinent labs & imaging results that were available during my care of the patient were reviewed by me and considered in my medical decision making (see chart for details).    ____________________________________________   FINAL CLINICAL IMPRESSION(S) / ED DIAGNOSES  Final diagnoses:  Wound dehiscence  Gangrene of left foot ([place])      NEW MEDICATIONS STARTED DURING THIS VISIT:  ED Discharge Orders    None       Note:  This document was prepared using Dragon voice recognition software and may include unintentional dictation errors.    Rudene Re, MD 03/06/18 [phone]

## 2018-03-06 NOTE — Telephone Encounter (Signed)
Shelly from [facility] called informing that the patient suture had dehiscence and was seeing if we could see her today or should she go to the ER.[facility] and she had inform that patient was sent to the ER.

## 2018-03-06 NOTE — ED Notes (Signed)
Skin assessment timed at 1231 completed at 1130.

## 2018-03-06 NOTE — Discharge Instructions (Addendum)
Dr. Lucky Cowboy and Dr. Cleda Mccreedy recommended amputation of the left foot. Please call Dr. Nino Parsley office to schedule the surgery.

## 2018-03-06 NOTE — ED Notes (Signed)
Esign not working, pt verbalized discharge instructions and has no questions at this time. Per Kenney Houseman RN report has already been called to [facility].

## 2018-03-06 NOTE — ED Notes (Signed)
[facility]  CALLED  FOR  TRANSPORT

## 2018-03-06 NOTE — ED Notes (Signed)
Report called to RN at [facility].

## 2018-03-08 ENCOUNTER — Other Ambulatory Visit: Payer: Self-pay

## 2018-03-08 ENCOUNTER — Emergency Department: Payer: Medicare Other

## 2018-03-08 ENCOUNTER — Inpatient Hospital Stay
Admission: EM | Admit: 2018-03-08 | Discharge: 2018-03-18 | DRG: 239 | Disposition: A | Discharge status: Skilled Nursing Facility | Payer: Medicare Other | Attending: Internal Medicine | Admitting: Internal Medicine

## 2018-03-08 DIAGNOSIS — Z89422 Acquired absence of other left toe(s): Secondary | ICD-10-CM

## 2018-03-08 DIAGNOSIS — E1152 Type 2 diabetes mellitus with diabetic peripheral angiopathy with gangrene: Secondary | ICD-10-CM | POA: Diagnosis present

## 2018-03-08 DIAGNOSIS — I472 Ventricular tachycardia, unspecified: Secondary | ICD-10-CM

## 2018-03-08 DIAGNOSIS — I959 Hypotension, unspecified: Secondary | ICD-10-CM | POA: Diagnosis not present

## 2018-03-08 DIAGNOSIS — E11649 Type 2 diabetes mellitus with hypoglycemia without coma: Secondary | ICD-10-CM | POA: Diagnosis present

## 2018-03-08 DIAGNOSIS — Z89512 Acquired absence of left leg below knee: Secondary | ICD-10-CM | POA: Diagnosis not present

## 2018-03-08 DIAGNOSIS — I252 Old myocardial infarction: Secondary | ICD-10-CM

## 2018-03-08 DIAGNOSIS — N186 End stage renal disease: Secondary | ICD-10-CM | POA: Diagnosis present

## 2018-03-08 DIAGNOSIS — Z951 Presence of aortocoronary bypass graft: Secondary | ICD-10-CM | POA: Diagnosis not present

## 2018-03-08 DIAGNOSIS — M868X6 Other osteomyelitis, lower leg: Secondary | ICD-10-CM | POA: Diagnosis present

## 2018-03-08 DIAGNOSIS — E1122 Type 2 diabetes mellitus with diabetic chronic kidney disease: Secondary | ICD-10-CM | POA: Diagnosis present

## 2018-03-08 DIAGNOSIS — Z79891 Long term (current) use of opiate analgesic: Secondary | ICD-10-CM

## 2018-03-08 DIAGNOSIS — J9811 Atelectasis: Secondary | ICD-10-CM | POA: Diagnosis not present

## 2018-03-08 DIAGNOSIS — Z66 Do not resuscitate: Secondary | ICD-10-CM | POA: Diagnosis present

## 2018-03-08 DIAGNOSIS — I2510 Atherosclerotic heart disease of native coronary artery without angina pectoris: Secondary | ICD-10-CM | POA: Diagnosis present

## 2018-03-08 DIAGNOSIS — R404 Transient alteration of awareness: Secondary | ICD-10-CM

## 2018-03-08 DIAGNOSIS — Z992 Dependence on renal dialysis: Secondary | ICD-10-CM

## 2018-03-08 DIAGNOSIS — E11319 Type 2 diabetes mellitus with unspecified diabetic retinopathy without macular edema: Secondary | ICD-10-CM | POA: Diagnosis present

## 2018-03-08 DIAGNOSIS — H4313 Vitreous hemorrhage, bilateral: Secondary | ICD-10-CM | POA: Diagnosis present

## 2018-03-08 DIAGNOSIS — H538 Other visual disturbances: Secondary | ICD-10-CM | POA: Diagnosis present

## 2018-03-08 DIAGNOSIS — Z888 Allergy status to other drugs, medicaments and biological substances status: Secondary | ICD-10-CM

## 2018-03-08 DIAGNOSIS — D631 Anemia in chronic kidney disease: Secondary | ICD-10-CM | POA: Diagnosis present

## 2018-03-08 DIAGNOSIS — I160 Hypertensive urgency: Principal | ICD-10-CM | POA: Diagnosis present

## 2018-03-08 DIAGNOSIS — R410 Disorientation, unspecified: Secondary | ICD-10-CM

## 2018-03-08 DIAGNOSIS — J9601 Acute respiratory failure with hypoxia: Secondary | ICD-10-CM | POA: Diagnosis present

## 2018-03-08 DIAGNOSIS — I639 Cerebral infarction, unspecified: Secondary | ICD-10-CM

## 2018-03-08 DIAGNOSIS — I10 Essential (primary) hypertension: Secondary | ICD-10-CM

## 2018-03-08 DIAGNOSIS — H5712 Ocular pain, left eye: Secondary | ICD-10-CM | POA: Diagnosis present

## 2018-03-08 DIAGNOSIS — I361 Nonrheumatic tricuspid (valve) insufficiency: Secondary | ICD-10-CM | POA: Diagnosis not present

## 2018-03-08 DIAGNOSIS — N2581 Secondary hyperparathyroidism of renal origin: Secondary | ICD-10-CM | POA: Diagnosis present

## 2018-03-08 DIAGNOSIS — I079 Rheumatic tricuspid valve disease, unspecified: Secondary | ICD-10-CM | POA: Diagnosis present

## 2018-03-08 DIAGNOSIS — H44002 Unspecified purulent endophthalmitis, left eye: Secondary | ICD-10-CM | POA: Diagnosis present

## 2018-03-08 DIAGNOSIS — R079 Chest pain, unspecified: Secondary | ICD-10-CM

## 2018-03-08 DIAGNOSIS — I132 Hypertensive heart and chronic kidney disease with heart failure and with stage 5 chronic kidney disease, or end stage renal disease: Secondary | ICD-10-CM | POA: Diagnosis present

## 2018-03-08 DIAGNOSIS — R61 Generalized hyperhidrosis: Secondary | ICD-10-CM | POA: Diagnosis not present

## 2018-03-08 DIAGNOSIS — I5032 Chronic diastolic (congestive) heart failure: Secondary | ICD-10-CM | POA: Diagnosis present

## 2018-03-08 DIAGNOSIS — I998 Other disorder of circulatory system: Secondary | ICD-10-CM | POA: Diagnosis present

## 2018-03-08 DIAGNOSIS — Z7982 Long term (current) use of aspirin: Secondary | ICD-10-CM

## 2018-03-08 DIAGNOSIS — I370 Nonrheumatic pulmonary valve stenosis: Secondary | ICD-10-CM | POA: Diagnosis not present

## 2018-03-08 DIAGNOSIS — K219 Gastro-esophageal reflux disease without esophagitis: Secondary | ICD-10-CM | POA: Diagnosis present

## 2018-03-08 DIAGNOSIS — R0902 Hypoxemia: Secondary | ICD-10-CM

## 2018-03-08 DIAGNOSIS — R4189 Other symptoms and signs involving cognitive functions and awareness: Secondary | ICD-10-CM | POA: Diagnosis not present

## 2018-03-08 DIAGNOSIS — E1169 Type 2 diabetes mellitus with other specified complication: Secondary | ICD-10-CM | POA: Diagnosis present

## 2018-03-08 DIAGNOSIS — Z79899 Other long term (current) drug therapy: Secondary | ICD-10-CM

## 2018-03-08 DIAGNOSIS — I96 Gangrene, not elsewhere classified: Secondary | ICD-10-CM | POA: Diagnosis not present

## 2018-03-08 DIAGNOSIS — Z794 Long term (current) use of insulin: Secondary | ICD-10-CM | POA: Diagnosis not present

## 2018-03-08 DIAGNOSIS — I70262 Atherosclerosis of native arteries of extremities with gangrene, left leg: Secondary | ICD-10-CM | POA: Diagnosis not present

## 2018-03-08 LAB — TROPONIN I
Troponin I: 0.05 ng/mL (ref <0.03)
Troponin I: 0.05 ng/mL (ref <0.03)

## 2018-03-08 LAB — CBC
HCT: 29.3 % — ABNORMAL LOW (ref 36.0–46.0)
Hemoglobin: 8.9 g/dL — ABNORMAL LOW (ref 12.0–15.0)
MCH: 30.8 pg (ref 26.0–34.0)
MCHC: 30.4 g/dL (ref 30.0–36.0)
MCV: 101.4 fL — ABNORMAL HIGH (ref 80.0–100.0)
Platelets: 406 10*3/uL — ABNORMAL HIGH (ref 150–400)
RBC: 2.89 MIL/uL — ABNORMAL LOW (ref 3.87–5.11)
RDW: 20.2 % — ABNORMAL HIGH (ref 11.5–15.5)
WBC: 11.1 10*3/uL — ABNORMAL HIGH (ref 4.0–10.5)
nRBC: 0 % (ref 0.0–0.2)

## 2018-03-08 LAB — BASIC METABOLIC PANEL
Anion gap: 10 (ref 5–15)
BUN: 23 mg/dL (ref 8–23)
CO2: 31 mmol/L (ref 22–32)
Calcium: 9 mg/dL (ref 8.9–10.3)
Chloride: 99 mmol/L (ref 98–111)
Creatinine, Ser: 4.83 mg/dL — ABNORMAL HIGH (ref 0.44–1.00)
GFR calc Af Amer: 10 mL/min — ABNORMAL LOW (ref >60)
GFR calc non Af Amer: 8 mL/min — ABNORMAL LOW (ref >60)
Glucose, Bld: 76 mg/dL (ref 70–99)
Potassium: 4 mmol/L (ref 3.5–5.1)
Sodium: 140 mmol/L (ref 135–145)

## 2018-03-08 MED ORDER — NITROGLYCERIN 2 % TD OINT
0.5000 [in_us] | TOPICAL_OINTMENT | TRANSDERMAL | Status: AC
  Administered 2018-03-08: 0.5 [in_us] via TOPICAL
  Filled 2018-03-08: qty 1

## 2018-03-08 MED ORDER — TIMOLOL MALEATE 0.5 % OP SOLN
1.0000 [drp] | Freq: Three times a day (TID) | OPHTHALMIC | Status: DC
  Administered 2018-03-08 – 2018-03-18 (×29): 1 [drp] via OPHTHALMIC
  Filled 2018-03-08 (×2): qty 5

## 2018-03-08 MED ORDER — FUROSEMIDE 10 MG/ML IJ SOLN
40.0000 mg | Freq: Once | INTRAMUSCULAR | Status: AC
  Administered 2018-03-08: 40 mg via INTRAVENOUS
  Filled 2018-03-08: qty 4

## 2018-03-08 MED ORDER — HYDRALAZINE HCL 20 MG/ML IJ SOLN
10.0000 mg | Freq: Four times a day (QID) | INTRAMUSCULAR | Status: DC | PRN
  Administered 2018-03-09 – 2018-03-14 (×5): 10 mg via INTRAVENOUS
  Filled 2018-03-08 (×5): qty 1

## 2018-03-08 MED ORDER — BRIMONIDINE TARTRATE 0.2 % OP SOLN
1.0000 [drp] | Freq: Three times a day (TID) | OPHTHALMIC | Status: DC
  Administered 2018-03-09 – 2018-03-18 (×26): 1 [drp] via OPHTHALMIC
  Filled 2018-03-08 (×3): qty 5

## 2018-03-08 MED ORDER — HYDRALAZINE HCL 20 MG/ML IJ SOLN
5.0000 mg | Freq: Once | INTRAMUSCULAR | Status: AC
  Administered 2018-03-08: 5 mg via INTRAVENOUS
  Filled 2018-03-08: qty 1

## 2018-03-08 MED ORDER — CEFAZOLIN SODIUM-DEXTROSE 1-4 GM/50ML-% IV SOLN
1.0000 g | INTRAVENOUS | Status: DC
  Administered 2018-03-09: 1 g via INTRAVENOUS
  Filled 2018-03-08 (×2): qty 50

## 2018-03-08 MED ORDER — CEFAZOLIN SODIUM-DEXTROSE 1-4 GM/50ML-% IV SOLN
1.0000 g | Freq: Once | INTRAVENOUS | Status: DC
  Filled 2018-03-08: qty 50

## 2018-03-08 NOTE — ED Notes (Signed)
Pt in CT Scan.

## 2018-03-08 NOTE — ED Notes (Signed)
Admitting MD at bedside.

## 2018-03-08 NOTE — ED Notes (Signed)
Troponin 0.05 reported to Dr. Reita Cliche.

## 2018-03-08 NOTE — ED Notes (Signed)
Pt to Urgent Care to be evaluated by ophthalmologist in eye room.

## 2018-03-08 NOTE — ED Triage Notes (Signed)
Per [facility], pt c/o intermittent chest pressure X 3 hours.    Given 2 SL nitro at [facility].  [facility] with 324 mg ASA. 12 Lead WNL.  VS:  195/88, 74.  On prn O2.  On room air, sats 87- improved to 95% on 3L.  BS:  110.

## 2018-03-08 NOTE — ED Notes (Signed)
Per Pharmacy:  They do not have the briminidine 0.2% eye drops.  They will come in the morning (11.18.19).

## 2018-03-08 NOTE — ED Notes (Signed)
Pt back from CT Scan.

## 2018-03-08 NOTE — ED Provider Notes (Signed)
I have spoken with Dr. George Ina of ophthalmology, he will see the patient in consult.  Additionally, she does report that she is been feeling short of breath today.  Her oxygen saturation and she speaks to me drops into the upper 80s, 8586%.  Placed on 2 L with improvement.  Also ordered nitroglycerin for ongoing nipping and hypertension.  Discussed with the patient, await ophthalmology consult and anticipate admission.   Delman Kitten, MD 03/08/18 [phone]

## 2018-03-08 NOTE — Consult Note (Signed)
Subjective: Pt. C/o decreased vision and pain OS for about a week. 5-6/10. " like a mild tootache" Denies headache or nausea. Had Avastin injection  OS in August with Dr. Roosevelt Locks at [facility]. Was due for repeat Avastin in Oct. Was unable to present due to other comorbidities/ medical issues. Also, had a laser capsulotomy with Dr Edison Pace at [facility] in September with no subsequent improvement in vision OS. She was COUNT FINGERS OS at that time due to sever diabatic retinopathy and vitreous hemorrhage. Has been unable to use glaucoma drops for 2 months due to being outside of her home.     Objective: Vital signs in last 24 hours: Temp:  [98.7 F (37.1 C)] 98.7 F (37.1 C) (11/17 1342) Pulse Rate:  [67-72] 70 (11/17 1530) Resp:  [0-24] 16 (11/17 1530) BP: (185-200)/(71-81) 185/71 (11/17 1530) SpO2:  [86 %-100 %] 100 % (11/17 1625) Weight:  [56.7 kg] 56.7 kg (11/17 1346)    EXAM:  UCVA OD 20/60   OS bare light perception.              Irregular pupils make APD difficult to assess.              IOP 71mHg OD      458mg OS Motility normal. Anterior segment.  Normal OD except for tube shunt. IOL.                                 OS has a  hyphema about 25% of AC with fibrosis and capsular remnant largely filling the remainder of the [facility] Center[facility] and Fibrosis in the tube shunt as well. Cornea is compact.  Posterior segment:  OD has BDR and laser scars from PRP. Over all quiet.                                   OS there is no view to post segment.  Recent Labs    03/08/18 1349  WBC 11.1*  HGB 8.9*  HCT 29.3*  NA 140  K 4.0  CL 99  CO2 31  BUN 23  CREATININE 4.83*    Studies/Results: Dg Chest 2 View  Result Date: 03/08/2018 CLINICAL DATA:  Chest pain and pressure. EXAM: CHEST - 2 VIEW COMPARISON:  02/11/2018 FINDINGS: The cardio pericardial silhouette is enlarged. There is pulmonary vascular congestion without overt pulmonary edema. Atelectasis or infiltrate noted in the lung bases bilaterally.  Bilateral pleural effusions evident, left greater than right. Left dialysis catheter tip overlies the right atrium. The visualized bony structures of the thorax are intact. IMPRESSION: Cardiomegaly with vascular congestion and bibasilar atelectasis or infiltrate. Small bilateral pleural effusions. Electronically Signed   By: ErMisty Stanley.D.   On: 03/08/2018 14:56   Ct Orbits Wo Contrast  Result Date: 03/08/2018 CLINICAL DATA:  [age]ear old female with left eye drainage and pain for 1 week. EXAM: CT ORBITS WITHOUT CONTRAST TECHNIQUE: Multidetector CT images were obtained using the standard protocol without intravenous contrast. COMPARISON:  MRI brain 11/15/2017, head CTs 10/15/2017. FINDINGS: Orbits: Chronic postoperative changes to both globes, appear stable since the June CT. Globes remain intact. No definite asymmetric periorbital or preseptal soft tissue thickening or inflammation. No postseptal inflammation. Pronounced chronic calcified atherosclerosis of the ophthalmic arteries stable since June (series 3, image 35). Bilateral orbital walls are intact. Other osseous structures: No acute osseous  abnormality identified. 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 Chronic mild bubbly opacity in the right sphenoid. The anterior ethmoids, frontal and maxillary sinuses remain clear. Tympanic cavities remain clear. New mild left mastoid effusion. Right mastoids remain clear. Soft tissues: Pronounced bilateral scalp calcified atherosclerosis. Noncontrast visible pharynx and parapharyngeal spaces are within normal limits. Limited intracranial: Bilateral carotid and vertebral artery calcified atherosclerosis. Stable visible noncontrast brain parenchyma. IMPRESSION: 1. No acute orbital findings evident on noncontrast CT. Stable postoperative changes to both globes. 2. Advanced calcified atherosclerosis, including chronic involvement of  the bilateral ophthalmic arteries and scalp vessels, suggesting chronic renal failure. 3. 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 There is a new mild left mastoid effusion, nonspecific but typically postinflammatory. Electronically Signed   By: Genevie Ann M.D.   On: 03/08/2018 16:14      Assessment/Plan: 1) Chronic large vitreous hemorrhage OD. S/p Laser capsulotomy. Now with spillover into the [facility] and subsequent elevated IOP. Definitive treatment is likely [place] Vitrectomy. This may help to control IOP. Visual prognosis is extremely poor. 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  She should present to the [facility] as soon as she is discharged to see Dr. Roosevelt Locks and discuss surgery.  In the meantime, an [place] suppressant may lower her pressure and improve her comfort level.  LOS: 0 days   Birder Robson 11/17/20195:25 PM

## 2018-03-08 NOTE — ED Provider Notes (Signed)
[facility] Emergency Department Provider Note ____________________________________________   I have reviewed the triage vital signs and the triage nursing note.  HISTORY  Chief Complaint Chest Pain   Historian Patient  HPI Stephanie Ellis is a 67 y.o. female dialysis patient, dialyzes on Tuesday Thursday and Saturday, from [facility], presents for elevated blood pressure 200 over 90s or so and complaint of chest pressure that happened around lunchtime, just a few hours ago.  Patient states that she feels mostly better now.  Last dialysis was yesterday.  No fever cough or congestion or shortness of breath.  She does have a history of indigestion.  She also complains of left eye drainage and dull ache in the left eye for about 1 week.  She states that she is an eye appointment tomorrow, but states that no one has told her what is going on with that and that is most bothersome to her.       Past Medical History:  Diagnosis Date  . (HFpEF) heart failure with preserved ejection fraction ([place])    a. 01/2018 Echo: EF 50-55%, no rwma, Gr1 DD, triv AI, mild MR. Midly dil LA/RV, mod reduced RV fxn. Irreg thickening of TV w/ mobile echodensity that appears to arise from valve.  . Anemia   . Anorexia   . Bacteremia due to Pseudomonas   . Carotid arterial disease ([place])    a. 01/2017 Carotid U/S: RICA <97, LICA <98.  Marland Kitchen Complication of anesthesia    a. Pt reports h/o complication on 9 different occasions - ? hypotension/arrest.  . 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 01/2015 Cath [facility]): 70% LM, p-mLAD 50-60% (Resting FFR 0.75), mRCA 80-90%, ~40 Ost OM & D1-->CABG; b. 01/2016 Staged PCI of LCX x 2 and RCA.  Marland Kitchen ESRD (end stage renal disease) on dialysis ([place])    a. ESRD secondary to acute kidney failure s/p CABG-->PD  . Essential hypertension   . GERD (gastroesophageal reflux disease)   . Heart murmur   .  Hypercholesterolemia   . Myocardial infarction ([place]) 2016  . PVD (peripheral vascular disease) ([place])    a. 01/30/2018 PV Angio: Sev Left Popliteal dzs s/p PTA w/ 99m lutonix DEB w/ mech aspiration of the L popliteal, L AT, and Left tibioperoneal trunck and peroneal artery.  . 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  [facility]: Dr. BMarland KitchenHaithcock: CABG x 3, LIMA to LAD, SVG to RCA, SVG to OM3, EVH  . Sinusitis 2019  . Type II diabetes mellitus with complication ([facility]    CAD    Patient Active Problem List   Diagnosis Date Noted  . Ischemia of extremity 02/18/2018  . ESRD (end stage renal disease) ([place]   . Diabetic osteomyelitis ([place]   . Peripheral vascular disease ([place]   . Advanced care planning/counseling discussion   . Palliative care by specialist   . Goals of care, counseling/discussion   . Diabetes mellitus, type 2 ([place] 01/30/2018  . Sepsis ([place] 01/29/2018  . Peripheral neuropathy 06/17/2017  . Encephalopathy 02/06/2017  . Hypertensive emergency 02/06/2017  . Endophthalmitis, left eye 12/05/2016  . Vomiting 10/24/2016  . Hematuria 10/17/2016  . Acute lower UTI 10/17/2016  . Anesthesia complication 092/02/9416 . Chest pain with moderate risk for cardiac etiology 09/04/2016  . Steal syndrome dialysis vascular access, initial encounter ([place] 06/18/2016  . 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 Coronary artery disease involving coronary bypass graft of  native heart with angina pectoris ([place]) 02/10/2016  . Hypertension 02/02/2016  . Hyperlipidemia 02/02/2016  . Coronary artery disease involving left main coronary artery 10/04/2015  . Abdominal pain of unknown etiology 10/04/2015  . ESRD (end stage renal disease) on dialysis ([place])   . Type II diabetes mellitus with complication ([place])   . NSTEMI (non-ST elevated myocardial infarction) ([place])   . Right sided abdominal pain   . Elevated troponin 10/03/2015  . 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 Chronic diastolic congestive  heart failure ([place]) 04/19/2014  . Stage 5 chronic kidney disease on chronic dialysis ([place]) 04/19/2014  . Anemia 09/20/2013  . Routine health maintenance 01/29/2013    Past Surgical History:  Procedure Laterality Date  . ABDOMINAL HYSTERECTOMY    . AMPUTATION Right 02/25/2018   Procedure: AMPUTATION BELOW KNEE;  Surgeon: Katha Cabal, MD;  Location: [facility] ORS;  Service: Vascular;  Laterality: Right;  . AMPUTATION TOE Left 02/06/2018   Procedure: AMPUTATION GREAT TOE;  Surgeon: Samara Deist, DPM;  Location: [facility] ORS;  Service: Podiatry;  Laterality: Left;  . ARTERIOVENOUS GRAFT PLACEMENT  05/2016  . AV FISTULA PLACEMENT Left 05/30/2016   Procedure: ARTERIOVENOUS graft;  Surgeon: Algernon Huxley, MD;  Location: [facility] ORS;  Service: Vascular;  Laterality: Left;  . CAPD INSERTION N/A 10/30/2017   Procedure: LAPAROSCOPIC INSERTION CONTINUOUS AMBULATORY PERITONEAL DIALYSIS  (CAPD) CATHETER;  Surgeon: Algernon Huxley, MD;  Location: [facility] ORS;  Service: Vascular;  Laterality: N/A;  . CARDIAC CATHETERIZATION  01/2015   [facility]: Ost LM 70%, p-m LAD 50-60% (Rest FFR + @ 0.75), mRCA 80-90%, ostD1 40%, pOM1 40%  . CATARACT EXTRACTION W/PHACO Left 01/18/2016   Procedure: CATARACT EXTRACTION PHACO AND INTRAOCULAR LENS PLACEMENT (IOC);  Surgeon: Eulogio Bear, MD;  Location: [facility] OR[facility];  Service: Ophthalmology;  Laterality: Left;  [place] 1.05 AP% 15.5 CDE 10.16 Fluid Pack Lot # [id number]  . CATARACT EXTRACTION W/PHACO Right 08/01/2016   Procedure: CATARACT EXTRACTION PHACO AND INTRAOCULAR LENS PLACEMENT (IOC);  Surgeon: Eulogio Bear, MD;  Location: [facility] OR[facility];  Service: Ophthalmology;  Laterality: Right;  [place] 01:00.6 AP% 11.4 CDE 6.93  LOT # [id number]  . COLONOSCOPY    . CORONARY ANGIOPLASTY     SENTS 02/12/16  . CORONARY ARTERY BYPASS GRAFT  03/28/15    [facility]: Dr. Waldemar Dickens: LIMA to LAD, SVG to RCA, SVG to OM3, EVH  . DIALYSIS/PERMA CATHETER INSERTION N/A 02/11/2018   Procedure: DIALYSIS/PERMA CATHETER  INSERTION;  Surgeon: Algernon Huxley, MD;  Location: [place] [facility];  Service: Cardiovascular;  Laterality: N/A;  . DIALYSIS/PERMA CATHETER REMOVAL Right 02/04/2018   Procedure: DIALYSIS/PERMA CATHETER REMOVAL;  Surgeon: Katha Cabal, MD;  Location: [place] [facility];  Service: Cardiovascular;  Laterality: Right;  . ESOPHAGOGASTRODUODENOSCOPY (EGD) WITH PROPOFOL N/A 10/24/2016   Procedure: ESOPHAGOGASTRODUODENOSCOPY (EGD) WITH PROPOFOL;  Surgeon: [name], MD;  Location: [facility];  Service: Endoscopy;  Laterality: N/A;  . EYE SURGERY Bilateral    cataract surgery  . EYE SURGERY     drains for glaucoma  . INSERTION EXPRESS TUBE SHUNT Right 08/01/2016   Procedure: INSERTION EXPRESS TUBE SHUNT;  Surgeon: Eulogio Bear, MD;  Location: [facility];  Service: Ophthalmology;  Laterality: Right;  . INSERTION OF AHMED VALVE Left 08/15/2016   Procedure: INSERTION OF AHMED VALVE;  Surgeon: Eulogio Bear, MD;  Location: [facility];  Service: Ophthalmology;  Laterality: Left;  . INSERTION OF DIALYSIS CATHETER    . LOWER EXTREMITY ANGIOGRAPHY Left  01/30/2018   Procedure: Lower Extremity Angiography;  Surgeon: Katha Cabal, MD;  Location: [place] [facility];  Service: Cardiovascular;  Laterality: Left;  . LOWER EXTREMITY ANGIOGRAPHY Right 02/19/2018   Procedure: Lower Extremity Angiography;  Surgeon: Algernon Huxley, MD;  Location: [place] [facility];  Service: Cardiovascular;  Laterality: Right;  . REMOVAL OF A DIALYSIS CATHETER N/A 02/25/2018   Procedure: REMOVAL OF A DIALYSIS CATHETER;  Surgeon: Katha Cabal, MD;  Location: [facility];  Service: Vascular;  Laterality: N/A;  . TEMPORARY DIALYSIS CATHETER N/A 02/06/2018   Procedure: TEMPORARY DIALYSIS CATHETER;  Surgeon: Katha Cabal, MD;  Location: [place] [facility];  Service: Cardiovascular;  Laterality: N/A;  . TRANSTHORACIC ECHOCARDIOGRAM  January 2017    EF 60-65%. GR 2 DD. Mild degenerative mitral valve disease  but no prolapse or regurgitation. Mild left atrial dilation. Mild to moderate LVH. Pericardial effusion gone  . UPPER EXTREMITY ANGIOGRAPHY Left 06/27/2016   Procedure: Upper Extremity Angiography;  Surgeon: Algernon Huxley, MD;  Location: [place] [facility];  Service: Cardiovascular;  Laterality: Left;  . UPPER EXTREMITY ANGIOGRAPHY Left 08/22/2016   Procedure: Upper Extremity Angiography;  Surgeon: Algernon Huxley, MD;  Location: [place] [facility];  Service: Cardiovascular;  Laterality: Left;    Prior to Admission medications   Medication Sig Start Date End Date Taking? Authorizing Provider  apixaban (ELIQUIS) 2.5 MG TABS tablet Take 1 tablet (2.5 mg total) by mouth 2 (two) times daily. 02/15/18  Yes Vaughan Basta, MD  aspirin EC 81 MG tablet Take 1 tablet (81 mg total) by mouth daily. 10/19/16  Yes Dustin Flock, MD  atorvastatin (LIPITOR) 80 MG tablet Take 80 mg by mouth at bedtime.  05/05/17  Yes [provider]  calcitRIOL (ROCALTROL) 0.5 MCG capsule Take 0.5 mcg by mouth daily.   Yes [provider]  carvedilol (COREG) 3.125 MG tablet Take 3.125 mg by mouth 2 (two) times daily.    Yes [provider]  ceFAZolin (ANCEF) IVPB Indication:  Osteomyelitis/Tricuspic Endocarditis Last Day of Therapy:  03/13/18 Labs - Once weekly:  CBC/D and CMP, 02/17/18 03/13/18 Yes Vaughan Basta, MD  docusate sodium (COLACE) 100 MG capsule Take 1 capsule (100 mg total) by mouth 2 (two) times daily. 02/15/18  Yes Vaughan Basta, MD  folic acid (FOLVITE) 1 MG tablet Take 1 tablet (1 mg total) by mouth daily. 02/15/18 02/15/19 Yes Vaughan Basta, MD  insulin glargine (LANTUS) 100 unit/mL SOPN Inject 5 Units into the skin at bedtime.   Yes [provider]  isosorbide mononitrate (IMDUR) 30 MG 24 hr tablet Take 30 mg by mouth daily.    Yes [provider]  midodrine (PROAMATINE) 10 MG tablet Take 1 tablet (10 mg total) by mouth 3 (three) times  a week. Patient taking differently: Take 10 mg by mouth 3 (three) times a week. Take Tuesday, Thursday, and Saturday 02/17/18  Yes Vaughan Basta, MD  mirtazapine (REMERON) 7.5 MG tablet Take 7.5 mg by mouth at bedtime.   Yes [provider]  Multiple Vitamin (MULTIVITAMIN WITH MINERALS) TABS tablet Take 2 tablets by mouth daily. Gummy vitamins   Yes [provider]  Multiple Vitamins-Minerals (PRORENAL + D) TABS Take 1 tablet by mouth daily.   Yes [provider]  nitroGLYCERIN (NITROSTAT) 0.4 MG SL tablet Place 1 tablet (0.4 mg total) under the tongue every 5 (five) minutes as needed for chest pain. 09/05/16  Yes Loletha Grayer, MD  nizatidine (AXID) 150  MG capsule Take 1 capsule (150 mg total) by mouth 2 (two) times daily. 02/15/18  Yes Vaughan Basta, MD  Nutritional Supplements (FEEDING SUPPLEMENT, NEPRO CARB STEADY,) LIQD Take 237 mLs by mouth 2 (two) times daily between meals. 02/15/18  Yes Vaughan Basta, MD  Omega-3 300 MG CAPS Take 2 capsules by mouth daily.   Yes [provider]  ondansetron (ZOFRAN) 4 MG tablet Take 4 mg by mouth every 8 (eight) hours as needed for nausea or vomiting.   Yes [provider]  oxyCODONE (OXY IR/ROXICODONE) 5 MG immediate release tablet Take 1 tablet (5 mg total) by mouth every 6 (six) hours as needed for moderate pain or severe pain. 02/28/18  Yes Fritzi Mandes, MD  Polyethyl Glycol-Propyl Glycol 0.4-0.3 % SOLN Place 1 drop into both eyes 2 (two) times daily.   Yes [provider]  sevelamer carbonate (RENVELA) 800 MG tablet Take 800 mg by mouth 3 (three) times daily with meals.   Yes [provider]  vitamin B-12 (CYANOCOBALAMIN) 1000 MCG tablet Take 1 tablet (1,000 mcg total) by mouth daily. Patient taking differently: Take 500 mcg by mouth daily.  02/15/18  Yes Vaughan Basta, MD    Allergies  Allergen Reactions  . Chlorthalidone Anaphylaxis, Itching and Rash   . Fentanyl Rash  . Midazolam Rash  . Ace Inhibitors Other (See Comments)    Reaction:  Hyperkalemia, agitation   . Angiotensin Receptor Blockers Other (See Comments)    Reaction:  Hyperkalemia, agitation   . Norvasc [Amlodipine] Itching and Rash  . Phenergan [Promethazine Hcl] Anxiety    "antsy, can't sit still"    Family History  Problem Relation Age of Onset  . Diabetes Mellitus II Mother   . Heart failure Mother   . Pancreatic cancer Father     Social History Social History   Tobacco Use  . Smoking status: Never Smoker  . Smokeless tobacco: Never Used  Substance Use Topics  . Alcohol use: No  . Drug use: No    Review of Systems  Constitutional: Negative for fever. Eyes: Unable to see out of left eye, left eye pain, left eye swelling, left eye tearing. ENT: Negative for sore throat. Cardiovascular: Chest pressure as per HPI earlier, now better. Respiratory: Negative for shortness of breath. Gastrointestinal: Negative for abdominal pain, vomiting and diarrhea. Genitourinary: Negative for dysuria. Musculoskeletal: Negative for back pain. Skin: Negative for rash. Neurological: Negative for headache.  ____________________________________________   PHYSICAL EXAM:  VITAL SIGNS: ED Triage Vitals  Enc Vitals Group     BP 03/08/18 1342 (!) 200/79     Pulse Rate 03/08/18 1342 69     Resp 03/08/18 1342 16     Temp 03/08/18 1342 98.7 F (37.1 C)     Temp src --      SpO2 03/08/18 1342 92 %     Weight 03/08/18 1346 125 lb (56.7 kg)     Height 03/08/18 1346 5' 4"  (1.626 m)     Head Circumference --      Peak Flow --      Pain Score 03/08/18 1345 0     Pain Loc --      Pain Edu? --      Excl. in [place]? --      Constitutional: Alert and oriented.  HEENT      Head: Normocephalic and atraumatic.      Eyes: Right eye normal in appearance.  Left eye unable to see out of it, there  appears to be hyphema, conjunctiva is injected, there is swelling at the upper lateral  sclera.      Ears:         Nose: No congestion/rhinnorhea.      Mouth/Throat: Mucous membranes are moist.      Neck: No stridor. Cardiovascular/Chest: Normal rate, regular rhythm.  No murmurs, rubs, or gallops. Respiratory: Normal respiratory effort without tachypnea nor retractions. Breath sounds are clear and equal bilaterally. No wheezes/rales/rhonchi. Gastrointestinal: Soft. No distention, no guarding, no rebound. Nontender.    Genitourinary/rectal:Deferred Musculoskeletal: Nontender with normal range of motion in all extremities. No joint effusions.  No lower extremity tenderness.  No edema. Neurologic:  Normal speech and language. No gross or focal neurologic deficits are appreciated. Skin:  Skin is warm, dry and intact. No rash noted. Psychiatric: Mood and affect are normal. Speech and behavior are normal. Patient exhibits appropriate insight and judgment.   ____________________________________________  LABS (pertinent positives/negatives) I, Lisa Roca, MD the attending physician have reviewed the labs noted below.  Labs Reviewed  BASIC METABOLIC PANEL - Abnormal; Notable for the following components:      Result Value   Creatinine, Ser 4.83 (*)    GFR calc non Af Amer 8 (*)    GFR calc Af Amer 10 (*)    All other components within normal limits  CBC - Abnormal; Notable for the following components:   WBC 11.1 (*)    RBC 2.89 (*)    Hemoglobin 8.9 (*)    HCT 29.3 (*)    MCV 101.4 (*)    RDW 20.2 (*)    Platelets 406 (*)    All other components within normal limits  TROPONIN I - Abnormal; Notable for the following components:   Troponin I 0.05 (*)    All other components within normal limits  TROPONIN I    ____________________________________________    EKG I, Lisa Roca, MD, the attending physician have personally viewed and interpreted all ECGs.  69 bpm.  Normal sinus rhythm.  Complete right bundle branch block.  Left axis deviation.  Likely LVH with  report change.  T waves inverted inferiorly and laterally as well as septally.  Pretty similar to prior EKG. ____________________________________________  RADIOLOGY   Chest x-ray two-view:  IMPRESSION: Cardiomegaly with vascular congestion and bibasilar atelectasis or infiltrate.  Small bilateral pleural effusions.  CT orbits without contrast: Pending __________________________________________  PROCEDURES  Procedure(s) performed: None  Procedures  Critical Care performed: None   ____________________________________________  ED COURSE / ASSESSMENT AND PLAN  Pertinent labs & imaging results that were available during my care of the patient were reviewed by me and considered in my medical decision making (see chart for details).   Patient sent here for elevated blood pressures and chest pressure.  Chest pressure seems to be better now.  EKG is not quite normal, but really is similar to previous, no new ischemic findings.  Troponin 0 0.05, but she has a baseline elevated troponin.  I will send a recheck although I have low suspicion for active acute coronary process.  She is hypertensive here.  States that she had her blood pressure medicines today.  She is a dialysis patient but had dialysis yesterday.  She does have increased JVD and chest x-ray shows small pleural effusions and vascular congestion.  She is not having hypoxia.  Her potassium is normal.  I do not think that she needs emergency dialysis right now.  She is not coughing.  Her white blood  count is 11.  Her hemoglobin is anemic but similar to prior.  Her main complaint to me is actually that her left eye hurts and on exam it is swollen with an area of the lateral eyeball that look like as a mass on it.  I am can obtain CT imaging.  Was able to look at previous ophthalmology note from last year when she had surgery for:  Post-op Diagnosis: Visually significant epiretinal membrane, proliferative diabetic  retinopathy, history of endophthalmitis, left eye  Title of Operation(s): [place] Vitrectomy, epiretinal membrane [place], endolaser panretinal photocoagulation, left eye   Patient reports an acute change in terms of pain and unable to see about 1 week ago per her.   Patient care transferred to Dr. Jacqualine Code at shift change 3:15 PM.  Pending CT the orbits and likely discussion with ophthalmology.  Pending repeat troponin.       Patient / Family / Caregiver informed of clinical course, medical decision-making process, and agree with plan.     ___________________________________________   FINAL CLINICAL IMPRESSION(S) / ED DIAGNOSES   Final diagnoses:  Hypertension, unspecified type  Nonspecific chest pain  Left eye pain      ___________________________________________         Note: This dictation was prepared with Dragon dictation. Any transcriptional errors that result from this process are unintentional    Lisa Roca, MD 03/08/18 1513

## 2018-03-08 NOTE — H&P (Signed)
[facility] NAME: Stephanie Ellis    MR#:  [id number]  DATE OF BIRTH:  January 14, 1951  DATE OF ADMISSION:  03/08/2018  PRIMARY CARE PHYSICIAN: [facility]   REQUESTING/REFERRING PHYSICIAN: Dr. Delman Kitten  CHIEF COMPLAINT:   Chief Complaint  Patient presents with  . Chest Pain    HISTORY OF PRESENT ILLNESS:  Stephanie Ellis  is a 67 y.o. 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 Patient is a poor historian, daughter at bedside.  Patient was discharged to [facility] from recent hospitalization after she had a right BKA.  She complained of chest pressure and received nitroglycerin.  Blood pressure was noted to be elevated with systolic greater than 176 at the nursing home.  Also she was noted to be hypoxic.  She denies any dyspnea.  No tachypnea noted.  She is on 3 L oxygen now and sats improved to 95%, chest x-ray showing cardiomegaly and vascular congestion.  PAST MEDICAL HISTORY:   Past Medical History:  Diagnosis Date  . (HFpEF) heart failure with preserved ejection fraction ([place])    a. 01/2018 Echo: EF 50-55%, no rwma, Gr1 DD, triv AI, mild MR. Midly dil LA/RV, mod reduced RV fxn. Irreg thickening of TV w/ mobile echodensity that appears to arise from valve.  . Anemia   . Anorexia   . Bacteremia due to Pseudomonas   . Carotid arterial disease ([place])    a. 01/2017 Carotid U/S: RICA <16, LICA <07.  Marland Kitchen Complication of anesthesia    a. Pt reports h/o complication on 9 different occasions - ? hypotension/arrest.  . Coronary artery disease  involving left main coronary artery 01/2015   a. 01/2015 [facility]): 70% LM, p-mLAD 50-60% (Resting FFR 0.75), mRCA 80-90%, ~40 Ost OM & D1-->CABG; b. 01/2016 Staged PCI of LCX x 2 and RCA.  Marland Kitchen ESRD (end stage renal disease) on dialysis ([place])    a. ESRD secondary to acute kidney failure s/p CABG-->PD  . Essential hypertension   . GERD (gastroesophageal reflux disease)   . Heart murmur   . Hypercholesterolemia   . Myocardial infarction ([place]) 2016  . PVD (peripheral vascular disease) ([place])    a. 01/30/2018 PV Angio: Sev Left Popliteal dzs s/p PTA w/ 21m lutonix DEB w/ mech aspiration of the L popliteal, L AT, and Left tibioperoneal trunck and peroneal artery.  . 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  [facility]: Dr. BMarland KitchenHaithcock: CABG x 3, LIMA to LAD, SVG to RCA, SVG to OM3, EVH  . Sinusitis 2019  . Type II diabetes mellitus with complication (HCC)    CAD    PAST SURGICAL HISTORY:   Past Surgical History:  Procedure Laterality Date  . ABDOMINAL HYSTERECTOMY    . AMPUTATION Right 02/25/2018   Procedure: AMPUTATION BELOW KNEE;  Surgeon: SKatha Cabal MD;  Location: [facility] ORS;  Service: Vascular;  Laterality: Right;  . AMPUTATION TOE Left 02/06/2018   Procedure: AMPUTATION GREAT TOE;  Surgeon: FSamara Deist DPM;  Location: [facility] ORS;  Service: Podiatry;  Laterality: Left;  . ARTERIOVENOUS GRAFT PLACEMENT  05/2016  . AV FISTULA PLACEMENT  Left 05/30/2016   Procedure: ARTERIOVENOUS graft;  Surgeon: Algernon Huxley, MD;  Location: [facility] ORS;  Service: Vascular;  Laterality: Left;  . CAPD INSERTION N/A 10/30/2017   Procedure: LAPAROSCOPIC INSERTION CONTINUOUS AMBULATORY PERITONEAL DIALYSIS  (CAPD) CATHETER;  Surgeon: Algernon Huxley, MD;  Location: [facility] ORS;  Service: Vascular;  Laterality: N/A;  . CARDIAC CATHETERIZATION  01/2015   [facility]: Ost LM 70%, p-m LAD 50-60% (Rest FFR + @ 0.75), mRCA 80-90%, ostD1 40%, pOM1 40%  . CATARACT EXTRACTION W/PHACO Left 01/18/2016   Procedure: CATARACT EXTRACTION PHACO AND  INTRAOCULAR LENS PLACEMENT (IOC);  Surgeon: Eulogio Bear, MD;  Location: [facility] OR[facility];  Service: Ophthalmology;  Laterality: Left;  [place] 1.05 AP% 15.5 CDE 10.16 Fluid Pack Lot # [id number]  . CATARACT EXTRACTION W/PHACO Right 08/01/2016   Procedure: CATARACT EXTRACTION PHACO AND INTRAOCULAR LENS PLACEMENT (IOC);  Surgeon: Eulogio Bear, MD;  Location: [facility] ORS;  Service: Ophthalmology;  Laterality: Right;  [place] 01:00.6 AP% 11.4 CDE 6.93  LOT # [id number]  . COLONOSCOPY    . CORONARY ANGIOPLASTY     SENTS 02/12/16  . CORONARY ARTERY BYPASS GRAFT  03/28/15    [facility]: Dr. Waldemar Dickens: LIMA to LAD, SVG to RCA, SVG to OM3, EVH  . DIALYSIS/PERMA CATHETER INSERTION N/A 02/11/2018   Procedure: DIALYSIS/PERMA CATHETER INSERTION;  Surgeon: Algernon Huxley, MD;  Location: [place] [facility];  Service: Cardiovascular;  Laterality: N/A;  . DIALYSIS/PERMA CATHETER REMOVAL Right 02/04/2018   Procedure: DIALYSIS/PERMA CATHETER REMOVAL;  Surgeon: Katha Cabal, MD;  Location: [place] [facility];  Service: Cardiovascular;  Laterality: Right;  . ESOPHAGOGASTRODUODENOSCOPY (EGD) WITH PROPOFOL N/A 10/24/2016   Procedure: ESOPHAGOGASTRODUODENOSCOPY (EGD) WITH PROPOFOL;  Surgeon: [name], MD;  Location: [facility];  Service: Endoscopy;  Laterality: N/A;  . EYE SURGERY Bilateral    cataract surgery  . EYE SURGERY     drains for glaucoma  . INSERTION EXPRESS TUBE SHUNT Right 08/01/2016   Procedure: INSERTION EXPRESS TUBE SHUNT;  Surgeon: Eulogio Bear, MD;  Location: [facility];  Service: Ophthalmology;  Laterality: Right;  . INSERTION OF AHMED VALVE Left 08/15/2016   Procedure: INSERTION OF AHMED VALVE;  Surgeon: Eulogio Bear, MD;  Location: [facility];  Service: Ophthalmology;  Laterality: Left;  . INSERTION OF DIALYSIS CATHETER    . LOWER EXTREMITY ANGIOGRAPHY Left 01/30/2018   Procedure: Lower Extremity Angiography;  Surgeon: Katha Cabal, MD;  Location: [place] [facility];  Service:  Cardiovascular;  Laterality: Left;  . LOWER EXTREMITY ANGIOGRAPHY Right 02/19/2018   Procedure: Lower Extremity Angiography;  Surgeon: Algernon Huxley, MD;  Location: [place] [facility];  Service: Cardiovascular;  Laterality: Right;  . REMOVAL OF A DIALYSIS CATHETER N/A 02/25/2018   Procedure: REMOVAL OF A DIALYSIS CATHETER;  Surgeon: Katha Cabal, MD;  Location: [facility];  Service: Vascular;  Laterality: N/A;  . TEMPORARY DIALYSIS CATHETER N/A 02/06/2018   Procedure: TEMPORARY DIALYSIS CATHETER;  Surgeon: Katha Cabal, MD;  Location: [place] [facility];  Service: Cardiovascular;  Laterality: N/A;  . TRANSTHORACIC ECHOCARDIOGRAM  January 2017    EF 60-65%. GR 2 DD. Mild degenerative mitral valve disease but no prolapse or regurgitation. Mild left atrial dilation. Mild to moderate LVH. Pericardial effusion gone  . UPPER EXTREMITY ANGIOGRAPHY Left 06/27/2016   Procedure: Upper Extremity Angiography;  Surgeon: Algernon Huxley, MD;  Location: [place] [facility];  Service: Cardiovascular;  Laterality: Left;  . UPPER EXTREMITY ANGIOGRAPHY Left 08/22/2016  Procedure: Upper Extremity Angiography;  Surgeon: Algernon Huxley, MD;  Location: [place] [facility];  Service: Cardiovascular;  Laterality: Left;    SOCIAL HISTORY:   Social History   Tobacco Use  . Smoking status: Never Smoker  . Smokeless tobacco: Never Used  Substance Use Topics  . Alcohol use: No    FAMILY HISTORY:   Family History  Problem Relation Age of Onset  . Diabetes Mellitus II Mother   . Heart failure Mother   . Pancreatic cancer Father     DRUG ALLERGIES:   Allergies  Allergen Reactions  . Chlorthalidone Anaphylaxis, Itching and Rash  . Fentanyl Rash  . Midazolam Rash  . Ace Inhibitors Other (See Comments)    Reaction:  Hyperkalemia, agitation   . Angiotensin Receptor Blockers Other (See Comments)    Reaction:  Hyperkalemia, agitation   . Norvasc [Amlodipine] Itching and Rash  . Phenergan [Promethazine  Hcl] Anxiety    "antsy, can't sit still"    REVIEW OF SYSTEMS:   Review of Systems  Constitutional: Positive for malaise/fatigue. Negative for chills, fever and weight loss.  HENT: Negative for ear discharge, ear pain, hearing loss and nosebleeds.   Eyes: Positive for blurred vision. Negative for double vision and photophobia.  Respiratory: Negative for cough, hemoptysis, shortness of breath and wheezing.   Cardiovascular: Negative for chest pain, palpitations, orthopnea and leg swelling.  Gastrointestinal: Negative for abdominal pain, constipation, diarrhea, heartburn, melena, nausea and vomiting.  Genitourinary: Negative for dysuria, frequency, hematuria and urgency.  Musculoskeletal: Positive for myalgias. Negative for back pain and neck pain.  Skin: Negative for rash.  Neurological: Positive for dizziness and headaches. Negative for tingling, tremors, sensory change, speech change and focal weakness.  Endo/Heme/Allergies: Does not bruise/bleed easily.  Psychiatric/Behavioral: Negative for depression.    MEDICATIONS AT HOME:   Prior to Admission medications   Medication Sig Start Date End Date Taking? Authorizing Provider  apixaban (ELIQUIS) 2.5 MG TABS tablet Take 1 tablet (2.5 mg total) by mouth 2 (two) times daily. 02/15/18  Yes Vaughan Basta, MD  aspirin EC 81 MG tablet Take 1 tablet (81 mg total) by mouth daily. 10/19/16  Yes Dustin Flock, MD  atorvastatin (LIPITOR) 80 MG tablet Take 80 mg by mouth at bedtime.  05/05/17  Yes [provider]  calcitRIOL (ROCALTROL) 0.5 MCG capsule Take 0.5 mcg by mouth daily.   Yes [provider]  carvedilol (COREG) 3.125 MG tablet Take 3.125 mg by mouth 2 (two) times daily.    Yes [provider]  ceFAZolin (ANCEF) IVPB Indication:  Osteomyelitis/Tricuspic Endocarditis Last Day of Therapy:  03/13/18 Labs - Once weekly:  CBC/D and CMP, 02/17/18 03/13/18 Yes Vaughan Basta, MD  docusate sodium  (COLACE) 100 MG capsule Take 1 capsule (100 mg total) by mouth 2 (two) times daily. 02/15/18  Yes Vaughan Basta, MD  folic acid (FOLVITE) 1 MG tablet Take 1 tablet (1 mg total) by mouth daily. 02/15/18 02/15/19 Yes Vaughan Basta, MD  insulin glargine (LANTUS) 100 unit/mL SOPN Inject 5 Units into the skin at bedtime.   Yes [provider]  isosorbide mononitrate (IMDUR) 30 MG 24 hr tablet Take 30 mg by mouth daily.    Yes [provider]  midodrine (PROAMATINE) 10 MG tablet Take 1 tablet (10 mg total) by mouth 3 (three) times a week. Patient taking differently: Take 10 mg by mouth 3 (three) times a week. Take Tuesday, Thursday, and Saturday 02/17/18  Yes Vaughan Basta, MD  mirtazapine (  REMERON) 7.5 MG tablet Take 7.5 mg by mouth at bedtime.   Yes [provider]  Multiple Vitamin (MULTIVITAMIN WITH MINERALS) TABS tablet Take 2 tablets by mouth daily. Gummy vitamins   Yes [provider]  Multiple Vitamins-Minerals (PRORENAL + D) TABS Take 1 tablet by mouth daily.   Yes [provider]  nitroGLYCERIN (NITROSTAT) 0.4 MG SL tablet Place 1 tablet (0.4 mg total) under the tongue every 5 (five) minutes as needed for chest pain. 09/05/16  Yes Wieting, Richard, MD  nizatidine (AXID) 150 MG capsule Take 1 capsule (150 mg total) by mouth 2 (two) times daily. 02/15/18  Yes Vaughan Basta, MD  Nutritional Supplements (FEEDING SUPPLEMENT, NEPRO CARB STEADY,) LIQD Take 237 mLs by mouth 2 (two) times daily between meals. 02/15/18  Yes Vaughan Basta, MD  Omega-3 300 MG CAPS Take 2 capsules by mouth daily.   Yes [provider]  ondansetron (ZOFRAN) 4 MG tablet Take 4 mg by mouth every 8 (eight) hours as needed for nausea or vomiting.   Yes [provider]  oxyCODONE (OXY IR/ROXICODONE) 5 MG immediate release tablet Take 1 tablet (5 mg total) by mouth every 6 (six) hours as needed for moderate pain or severe pain.  02/28/18  Yes Fritzi Mandes, MD  Polyethyl Glycol-Propyl Glycol 0.4-0.3 % SOLN Place 1 drop into both eyes 2 (two) times daily.   Yes [provider]  sevelamer carbonate (RENVELA) 800 MG tablet Take 800 mg by mouth 3 (three) times daily with meals.   Yes [provider]  vitamin B-12 (CYANOCOBALAMIN) 1000 MCG tablet Take 1 tablet (1,000 mcg total) by mouth daily. Patient taking differently: Take 500 mcg by mouth daily.  02/15/18  Yes Vaughan Basta, MD      VITAL SIGNS:  Blood pressure (!) 165/66, pulse 69, temperature 98.7 F (37.1 C), resp. rate 10, height 5' 4"  (1.626 m), weight 56.7 kg, SpO2 100 %.  PHYSICAL EXAMINATION:   Physical Exam  GENERAL:  67 y.o.-year-old patient lying in the bed with no acute distress.  EYES: Pupils equal, round, reactive to light and accommodation. No scleral icterus.  Periorbital edema noted.  Extraocular muscles intact.  HEENT: Head atraumatic, normocephalic. Oropharynx and nasopharynx clear.  NECK:  Supple, no jugular venous distention. No thyroid enlargement, no tenderness.  LUNGS: Normal breath sounds bilaterally, no wheezing, rales,rhonchi or crepitation. No use of accessory muscles of respiration.  Decreased bibasilar breath sounds. CARDIOVASCULAR: S1, S2 normal. No  rubs, or gallops.  2/6 systolic murmur is present Left chest permacath is present ABDOMEN: Soft, nontender, nondistended. Bowel sounds present. No organomegaly or mass.  EXTREMITIES: Status post right BKA, left foot status post first toe amputated, second and third toes are gangrenous.  No  cyanosis, or clubbing.  NEUROLOGIC: Cranial nerves II through XII are intact. Muscle strength 5/5 in all extremities. Sensation intact. Gait not checked.  Global weakness noted PSYCHIATRIC: The patient is alert and oriented x 3.  SKIN: No obvious rash, lesion, or ulcer.   LABORATORY PANEL:   CBC Recent Labs  Lab 03/08/18 1349  WBC 11.1*  HGB 8.9*  HCT 29.3*  PLT 406*     ------------------------------------------------------------------------------------------------------------------  Chemistries  Recent Labs  Lab 03/08/18 1349  NA 140  K 4.0  CL 99  CO2 31  GLUCOSE 76  BUN 23  CREATININE 4.83*  CALCIUM 9.0   ------------------------------------------------------------------------------------------------------------------  Cardiac Enzymes Recent Labs  Lab 03/08/18 1630  TROPONINI 0.05*   ------------------------------------------------------------------------------------------------------------------  RADIOLOGY:  Dg  Chest 2 View  Result Date: 03/08/2018 CLINICAL DATA:  Chest pain and pressure. EXAM: CHEST - 2 VIEW COMPARISON:  02/11/2018 FINDINGS: The cardio pericardial silhouette is enlarged. There is pulmonary vascular congestion without overt pulmonary edema. Atelectasis or infiltrate noted in the lung bases bilaterally. Bilateral pleural effusions evident, left greater than right. Left dialysis catheter tip overlies the right atrium. The visualized bony structures of the thorax are intact. IMPRESSION: Cardiomegaly with vascular congestion and bibasilar atelectasis or infiltrate. Small bilateral pleural effusions. Electronically Signed   By: [name] Stanley M.D.   On: 03/08/2018 14:56   Ct Orbits Wo Contrast  Result Date: 03/08/2018 CLINICAL DATA:  67 year old female with left eye drainage and pain for 1 week. EXAM: CT ORBITS WITHOUT CONTRAST TECHNIQUE: Multidetector CT images were obtained using the standard protocol without intravenous contrast. COMPARISON:  MRI brain 11/15/2017, head CTs 10/15/2017. FINDINGS: Orbits: Chronic postoperative changes to both globes, appear stable since the June CT. Globes remain intact. No definite asymmetric periorbital or preseptal soft tissue thickening or inflammation. No postseptal inflammation. Pronounced chronic calcified atherosclerosis of the ophthalmic arteries stable since June (series 3, image 35).  Bilateral orbital walls are intact. Other osseous structures: No acute osseous abnormality identified. 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 Chronic mild bubbly opacity in the right sphenoid. The anterior ethmoids, frontal and maxillary sinuses remain clear. Tympanic cavities remain clear. New mild left mastoid effusion. Right mastoids remain clear. Soft tissues: Pronounced bilateral scalp calcified atherosclerosis. Noncontrast visible pharynx and parapharyngeal spaces are within normal limits. Limited intracranial: Bilateral carotid and vertebral artery calcified atherosclerosis. Stable visible noncontrast brain parenchyma. IMPRESSION: 1. No acute orbital findings evident on noncontrast CT. Stable postoperative changes to both globes. 2. Advanced calcified atherosclerosis, including chronic involvement of the bilateral ophthalmic arteries and scalp vessels, suggesting chronic renal failure. 3. 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 There is a new mild left mastoid effusion, nonspecific but typically postinflammatory. Electronically Signed   By: Genevie Ann M.D.   On: 03/08/2018 16:14    EKG:   Orders placed or performed during the hospital encounter of 03/08/18  . ED EKG within 10 minutes  . EKG 12-Lead  . EKG 12-Lead  . ED EKG within 10 minutes    IMPRESSION AND PLAN:   Stephanie Ellis  is a 67 y.o. 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  1.  Acute hypoxia-secondary to pulmonary edema -Acutely needing 3 L oxygen.   Nephrology consulted for dialysis in a.m. -Wean O2 as tolerated -Received IV Lasix in ER  2.  Hypertensive urgency-IV hydralazine PRN and Nitropaste -Continue home medications.  Patient on carvedilol, Imdur -Continue her midodrine for now  3.  Diabetes mellitus-on Lantus  4.  Left eye blurred vision-appreciate ophthalmology input in the ED.  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  She had prior laser capsulotomy.  Eyedrops recommended at this time.  She will need pars plano vitrectomy-recommended outpatient follow-up after discharge.  5.  Peripheral arterial disease-had prior left foot first toe amputated and recent angiogram with angioplasty.  Also right BKA done recently. -  Left foot second and third toes are gangrenous now. -Consult vascular surgery.  6.  End-stage renal disease on hemodialysis-on Tuesday, Thursday and Saturday schedule.  Nephrology has been consulted for dialysis tomorrow  7.  Tricuspid endocarditis-with MSSA.  Currently on cefazolin with dialysis.  Continue until 03/13/2018.  8.  DVT prophylaxis-patient already on Eliquis (? Vascular reason?)    All the records are reviewed and case discussed with ED provider. Management plans discussed with the patient, family and they are in agreement.  CODE STATUS: Full Code  TOTAL TIME TAKING CARE OF THIS PATIENT: 55 minutes.    Gladstone Lighter M.D on 03/08/2018 at 8:02 PM  Between 7am to 6pm - Pager - [phone]  After 6pm go to [email or web address] - Proofreader  [facility]  Office  [phone]  CC: Primary care physician; [facility]

## 2018-03-08 NOTE — ED Provider Notes (Signed)
Patient is resting comfortably, after placing nitrates on the chest her blood pressure still quite elevated the patient blood pressures affirmed to be 787 systolic at this time.  She denies acute associated complaint.  Will give a small dose of IV hydralazine at this time, and given the patient's oxygen requirement will admit for ongoing care.  Consult has been placed to nephrology (Dr. Holley Raring) as well.  Patient agreeable and understanding for plan.  Ports she does not use home oxygen, currently requiring 1 to 2 L nasal cannula to maintain normal oxygen saturations.  In no acute distress.    Delman Kitten, MD 03/08/18 [phone]

## 2018-03-08 NOTE — Progress Notes (Signed)
Pharmacy Antibiotic Note  Stephanie Ellis is a 67 y.o. female admitted on 03/08/2018 with Endocarditis.  Pharmacy has been consulted for cefazolin dosing. Patient has ESRD and is on HD TTS.   Plan: Will start Ancef 1 g q24H. To be given after dialysis.  Plan was to continue until 03/13/18 per previous discharge note.   Height: 5' 4"  (162.6 cm) Weight: 125 lb (56.7 kg) IBW/kg (Calculated) : 54.7  Temp (24hrs), Avg:98.7 F (37.1 C), Min:98.7 F (37.1 C), Max:98.7 F (37.1 C)  Recent Labs  Lab 03/08/18 1349  WBC 11.1*  CREATININE 4.83*    Estimated Creatinine Clearance: 9.8 mL/min (A) (by C-G formula based on SCr of 4.83 mg/dL (H)).    Allergies  Allergen Reactions  . Chlorthalidone Anaphylaxis, Itching and Rash  . Fentanyl Rash  . Midazolam Rash  . Ace Inhibitors Other (See Comments)    Reaction:  Hyperkalemia, agitation   . Angiotensin Receptor Blockers Other (See Comments)    Reaction:  Hyperkalemia, agitation   . Norvasc [Amlodipine] Itching and Rash  . Phenergan [Promethazine Hcl] Anxiety    "antsy, can't sit still"    Antimicrobials this admission: 11/17 ancef >>   Dose adjustments this admission: Patient is on HD.  Microbiology results: None   Thank you for allowing pharmacy to be a part of this patient's care.  Stephanie Ellis 03/08/2018 7:45 PM

## 2018-03-09 ENCOUNTER — Other Ambulatory Visit: Payer: Self-pay

## 2018-03-09 ENCOUNTER — Encounter: Payer: Self-pay | Admitting: *Deleted

## 2018-03-09 MED ORDER — CHLORHEXIDINE GLUCONATE CLOTH 2 % EX PADS
6.0000 | MEDICATED_PAD | Freq: Every day | CUTANEOUS | Status: DC
  Administered 2018-03-10 – 2018-03-15 (×5): 6 via TOPICAL

## 2018-03-09 MED ORDER — SODIUM CHLORIDE 0.9 % IV SOLN: 100.0000 mL | INTRAVENOUS | Status: DC | PRN

## 2018-03-09 MED ORDER — LORAZEPAM 0.5 MG PO TABS
0.5000 mg | ORAL_TABLET | Freq: Four times a day (QID) | ORAL | Status: DC | PRN
  Administered 2018-03-09 – 2018-03-15 (×2): 0.5 mg via ORAL
  Filled 2018-03-09 (×2): qty 1

## 2018-03-09 MED ORDER — LIDOCAINE-PRILOCAINE 2.5-2.5 % EX CREA
1.0000 "application " | TOPICAL_CREAM | CUTANEOUS | Status: DC | PRN
  Filled 2018-03-09: qty 5

## 2018-03-09 MED ORDER — HEPARIN SODIUM (PORCINE) 1000 UNIT/ML DIALYSIS
1000.0000 [IU] | INTRAMUSCULAR | Status: DC | PRN
  Filled 2018-03-09: qty 1

## 2018-03-09 MED ORDER — CEFAZOLIN SODIUM-DEXTROSE 2-4 GM/100ML-% IV SOLN
2.0000 g | INTRAVENOUS | Status: AC
  Administered 2018-03-10 – 2018-03-12 (×2): 2 g via INTRAVENOUS
  Filled 2018-03-09 (×3): qty 100

## 2018-03-09 MED ORDER — ALTEPLASE 2 MG IJ SOLR: 2.0000 mg | Freq: Once | INTRAMUSCULAR | Status: DC | PRN

## 2018-03-09 MED ORDER — OXYCODONE-ACETAMINOPHEN 5-325 MG PO TABS
1.0000 | ORAL_TABLET | ORAL | Status: DC | PRN
  Administered 2018-03-09 – 2018-03-11 (×5): 1 via ORAL
  Administered 2018-03-12: 2 via ORAL
  Filled 2018-03-09: qty 2
  Filled 2018-03-09: qty 1
  Filled 2018-03-09: qty 2
  Filled 2018-03-09 (×3): qty 1

## 2018-03-09 MED ORDER — CEFAZOLIN SODIUM-DEXTROSE 2-4 GM/100ML-% IV SOLN: 2.0000 g | INTRAVENOUS | Status: DC

## 2018-03-09 MED ORDER — PENTAFLUOROPROP-TETRAFLUOROETH EX AERO
1.0000 "application " | INHALATION_SPRAY | CUTANEOUS | Status: DC | PRN
  Filled 2018-03-09: qty 30

## 2018-03-09 MED ORDER — LORAZEPAM 2 MG/ML IJ SOLN: 0.5000 mg | Freq: Four times a day (QID) | INTRAMUSCULAR | Status: DC | PRN

## 2018-03-09 MED ORDER — LIDOCAINE HCL (PF) 1 % IJ SOLN
5.0000 mL | INTRAMUSCULAR | Status: DC | PRN
  Filled 2018-03-09: qty 5

## 2018-03-09 MED ORDER — SALINE SPRAY 0.65 % NA SOLN
1.0000 | NASAL | Status: DC | PRN
  Filled 2018-03-09: qty 44

## 2018-03-09 MED ORDER — CHLORHEXIDINE GLUCONATE CLOTH 2 % EX PADS
6.0000 | MEDICATED_PAD | Freq: Every day | CUTANEOUS | Status: DC
  Administered 2018-03-09: 6 via TOPICAL

## 2018-03-09 MED ORDER — DEXTROSE 5 % IV SOLN
3.0000 g | INTRAVENOUS | Status: DC
  Filled 2018-03-09: qty 3000

## 2018-03-09 NOTE — Progress Notes (Signed)
Pre HD Assessment, pt states she did not miss her HD on Saturday, but was having some SOB wchich brought her to the ED.    03/09/18 0820  Neurological  Level of Consciousness Alert  Orientation Level Oriented X4  Respiratory  Respiratory Pattern Regular;Unlabored  Chest Assessment Chest expansion symmetrical  Bilateral Breath Sounds Diminished;Fine crackles  Cough None  Cardiac  Pulse Regular  Heart Sounds S1, S2  ECG Monitor Yes  Cardiac Rhythm NSR  Vascular  R Radial Pulse +1  L Radial Pulse +1  Edema Generalized  Generalized Edema +2  Psychosocial  Psychosocial (WDL) WDL  Family Behavior Verbal  Emotional support given Given to patient

## 2018-03-09 NOTE — Progress Notes (Signed)
Patient voiced concern regarding Left eye pain and drainage (tearing); eye drops ordered; Ophthalmology saw patient in ED; encouraged to discuss with rounding MD in am. Barbaraann Faster, RN 7:59 PM 03/09/2018

## 2018-03-09 NOTE — Progress Notes (Signed)
HD Tx started w/o complication    46/00/29 0828  Vital Signs  Resp 17  BP (!) 176/74  During Hemodialysis Assessment  Blood Flow Rate (mL/min) 400 mL/min  Arterial Pressure (mmHg) -180 mmHg  Venous Pressure (mmHg) 120 mmHg  Transmembrane Pressure (mmHg) 60 mmHg  Ultrafiltration Rate (mL/min) 1000 mL/min  Dialysate Flow Rate (mL/min) 800 ml/min  Conductivity: Machine  13.9  HD Safety Checks Performed Yes  Dialysis Fluid Bolus Normal Saline  Bolus Amount (mL) 250 mL  Intra-Hemodialysis Comments Tx initiated

## 2018-03-09 NOTE — Progress Notes (Signed)
Pre HD Tx    03/09/18 0825  Vital Signs  Temp 98.6 F (37 C)  Temp Source Oral  Pulse Rate 68  Pulse Rate Source Monitor  Resp 16  BP (!) 183/88  BP Location Right Arm  BP Method Automatic  Patient Position (if appropriate) Lying  Oxygen Therapy  SpO2 100 %  O2 Device Room Air  Pain Assessment  Pain Scale 0-10  Pain Score 0  Dialysis Weight  Weight 57.2 kg  Type of Weight Pre-Dialysis  Time-Out for Hemodialysis  What Procedure? HD  Pt Identifiers(min of two) First/Last Name;MRN/Account#  Correct Site? Yes  Correct Side? Yes  Correct Procedure? Yes  Consents Verified? Yes  Rad Studies Available? N/A  Engineer, civil (consulting) Number 1  Station Number 3  UF/Alarm Test Passed  Conductivity: Meter 14  Conductivity: Machine  14  pH 7.4  Reverse Osmosis Main  Normal Saline Lot Number [id number]  Dialyzer Lot Number [id number]  Disposable Set Lot Number [id number]  Machine Temperature 98.6 F (37 C)  Musician and Audible Yes  Blood Lines Intact and Secured Yes  Pre Treatment Patient Checks  Vascular access used during treatment Catheter  Hepatitis B Surface Antigen Results Negative  Date Hepatitis B Surface Antigen Drawn 11/26/17  Hepatitis B Surface Antibody 147  Date Hepatitis B Surface Antibody Drawn 11/26/17  Hemodialysis Consent Verified Yes  Hemodialysis Standing Orders Initiated Yes  ECG (Telemetry) Monitor On Yes  Prime Ordered Normal Saline  Length of  DialysisTreatment -hour(s) 3.5 Hour(s)  Dialyzer Elisio 17H NR  Dialysate 2K, 2.5 Ca  Dialysis Anticoagulant None  Dialysate Flow Ordered 800  Blood Flow Rate Ordered 400 mL/min  Ultrafiltration Goal 2.5 Liters  Pre Treatment Labs Phosphorus  Dialysis Blood Pressure Support Ordered Normal Saline  Education / Care Plan  Dialysis Education Provided Yes  Documented Education in Care Plan Yes  Hemodialysis Catheter Left Subclavian Double-lumen  Placement Date: 02/11/18   Placed prior to admission: No   Orientation: Left  Access Location: Subclavian  Hemodialysis Catheter Type: Double-lumen  Site Condition No complications  Blue Lumen Status Blood return noted  Red Lumen Status Blood return noted  Dressing Type Biopatch;Gauze/Drain sponge;Occlusive  Dressing Status Clean;Dry;Intact  Drainage Description None

## 2018-03-09 NOTE — Care Management (Addendum)
Amanda Morris HD liaison notified of admission.  

## 2018-03-09 NOTE — Progress Notes (Signed)
Patient started screaming shortly after she returned from dialysis. Charge nurse, NT and myself ran to patient's room and two family members were in the room with patient and she was hyperventilating and saying that she needed her eye drops. We took her vitals and they were stable. I encouraged her to take some deep breaths slowly which she did and I ended up calling Dr. Benjie Karvonen to request anxiety meds as that was the patient's request. Eye drops were given to patient and she calmed down.

## 2018-03-09 NOTE — Progress Notes (Signed)
[facility]  ROUNDING NOTE   Subjective:   [name] admitted to [facility] on 03/08/2018 for Hypertensive urgency [I16.0] Left eye pain [H57.12] Nonspecific chest pain [R07.9] Hypertension, unspecified type [I10]   Patient states she feels short of breath with orthopnea. She feels that she did not have enough fluid removal on hemodialysis on Saturday.   She has been brought down for an extra treatment. Tolerating hemodialysis well. UF goal of 2.5. Liters  Currently at [facility]  Objective:  Vital signs in last 24 hours:  Temp:  [98.5 F (36.9 C)-98.7 F (37.1 C)] 98.6 F (37 C) (11/18 0517) Pulse Rate:  [65-76] 69 (11/18 0517) Resp:  [0-24] 18 (11/18 0517) BP: (146-205)/(62-88) 167/69 (11/18 0517) SpO2:  [85 %-100 %] 100 % (11/18 0517) Weight:  [56.7 kg] 56.7 kg (11/17 1346)  Weight change:  Filed Weights   03/08/18 1346  Weight: 56.7 kg    Intake/Output: I/O last 3 completed shifts: In: 80 [IV Piggyback:50] Out: 0    Intake/Output this shift:  No intake/output data recorded.  Physical Exam: General: NAD,   Head: Normocephalic, atraumatic. Moist oral mucosal membranes  Eyes: Anicteric, PERRL  Neck: Supple, trachea midline  Lungs:  Crackles bilaterally  Heart: Regular rate and rhythm  Abdomen:  Soft, nontender,   Extremities:  no edema, right BKA, left with ischemic changes  Neurologic: Nonfocal, moving all four extremities  Skin: No lesions  Access: Left IJ permcath    Basic Metabolic Panel: Recent Labs  Lab 03/08/18 1349  NA 140  K 4.0  CL 99  CO2 31  GLUCOSE 76  BUN 23  CREATININE 4.83*  CALCIUM 9.0    Liver Function Tests: No results for input(s): AST, ALT, ALKPHOS, BILITOT, PROT, ALBUMIN in the last 168 hours. No results for input(s): LIPASE, AMYLASE in the last 168 hours. No results for input(s): AMMONIA in the last 168 hours.  CBC: Recent Labs  Lab 03/08/18 1349  WBC 11.1*  HGB 8.9*  HCT 29.3*  MCV 101.4*   PLT 406*    Cardiac Enzymes: Recent Labs  Lab 03/08/18 1349 03/08/18 1630  TROPONINI 0.05* 0.05*    BNP: Invalid input(s): POCBNP  CBG: No results for input(s): GLUCAP in the last 168 hours.  Microbiology: Results for orders placed or performed during the hospital encounter of 02/18/18  Blood Culture (routine x 2)     Status: None   Collection Time: 02/18/18  7:55 AM  Result Value Ref Range Status   Specimen Description BLOOD RIGHT [facility]  Final   Special Requests   Final    BOTTLES DRAWN AEROBIC AND ANAEROBIC Blood Culture results may not be optimal due to an excessive volume of blood received in culture bottles   Culture   Final    NO GROWTH 5 DAYS Performed at [facility], [address] [place], [place] [address]    Report Status 02/23/2018 FINAL  Final  Blood Culture (routine x 2)     Status: None   Collection Time: 02/18/18  7:55 AM  Result Value Ref Range Status   Specimen Description BLOOD RIGHT FA  Final   Special Requests   Final    BOTTLES DRAWN AEROBIC AND ANAEROBIC Blood Culture results may not be optimal due to an excessive volume of blood received in culture bottles   Culture   Final    NO GROWTH 5 DAYS Performed at [facility], [address] [place], [place] [address]  Report Status 02/23/2018 FINAL  Final    Coagulation Studies: No results for input(s): LABPROT, INR in the last 72 hours.  Urinalysis: No results for input(s): COLORURINE, LABSPEC, PHURINE, GLUCOSEU, HGBUR, BILIRUBINUR, KETONESUR, PROTEINUR, UROBILINOGEN, NITRITE, LEUKOCYTESUR in the last 72 hours.  Invalid input(s): APPERANCEUR    Imaging: Dg Chest 2 View  Result Date: 03/08/2018 CLINICAL DATA:  Chest pain and pressure. EXAM: CHEST - 2 VIEW COMPARISON:  02/11/2018 FINDINGS: The cardio pericardial silhouette is enlarged. There is pulmonary vascular congestion without overt pulmonary edema. Atelectasis or infiltrate noted in the lung bases bilaterally.  Bilateral pleural effusions evident, left greater than right. Left dialysis catheter tip overlies the right atrium. The visualized bony structures of the thorax are intact. IMPRESSION: Cardiomegaly with vascular congestion and bibasilar atelectasis or infiltrate. Small bilateral pleural effusions. Electronically Signed   By: [name] Stanley M.D.   On: 03/08/2018 14:56   Ct Orbits Wo Contrast  Result Date: 03/08/2018 CLINICAL DATA:  67 year old female with left eye drainage and pain for 1 week. EXAM: CT ORBITS WITHOUT CONTRAST TECHNIQUE: Multidetector CT images were obtained using the standard protocol without intravenous contrast. COMPARISON:  MRI brain 11/15/2017, head CTs 10/15/2017. FINDINGS: Orbits: Chronic postoperative changes to both globes, appear stable since the June CT. Globes remain intact. No definite asymmetric periorbital or preseptal soft tissue thickening or inflammation. No postseptal inflammation. Pronounced chronic calcified atherosclerosis of the ophthalmic arteries stable since June (series 3, image 35). Bilateral orbital walls are intact. Other osseous structures: No acute osseous abnormality identified. 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 Chronic mild bubbly opacity in the right sphenoid. The anterior ethmoids, frontal and maxillary sinuses remain clear. Tympanic cavities remain clear. New mild left mastoid effusion. Right mastoids remain clear. Soft tissues: Pronounced bilateral scalp calcified atherosclerosis. Noncontrast visible pharynx and parapharyngeal spaces are within normal limits. Limited intracranial: Bilateral carotid and vertebral artery calcified atherosclerosis. Stable visible noncontrast brain parenchyma. IMPRESSION: 1. No acute orbital findings evident on noncontrast CT. Stable postoperative changes to both globes. 2. Advanced calcified atherosclerosis, including chronic involvement of  the bilateral ophthalmic arteries and scalp vessels, suggesting chronic renal failure. 3. 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 There is a new mild left mastoid effusion, nonspecific but typically postinflammatory. Electronically Signed   By: Genevie Ann M.D.   On: 03/08/2018 16:14     Medications:   .  ceFAZolin (ANCEF) IV Stopped (03/09/18 0030)   . brimonidine  1 drop Both Eyes TID  . Chlorhexidine Gluconate Cloth  6 each Topical Q0600  . timolol  1 drop Both Eyes TID   hydrALAZINE, sodium chloride  Assessment/ Plan:  [name] is a 67 y.o. 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  [facility] [place] TTS hemodialysis RIJ permcath  1. End Stage Renal Disease emergent hemodialysis treatment this morning due to pulmonary edema.  - Will adjust outpatient estimated dry weight.  - Tolerating hemodialysis treatment today.  - Resume TTS schedule.   2. Ischemic limb with osteomyelitis and endocarditis - Cefazolin 2g until 11/22  3. Anemia of chronic kidney disease: hemoglobin 8.9 - EPO 10000 units with hemodialysis treatment tomorrow.   4. Hypotension:  - Midodrine usually before treatment. Not given today, will monitor blood pressures.   5. Secondary Hyperparathyroidism:  holding sevelamer due to hypophosphatemia - Recheck phos level.    LOS: 1 Jayd Forrey 11/18/20199:04 AM

## 2018-03-09 NOTE — Progress Notes (Signed)
HD tx completed.    03/09/18 1115  Vital Signs  Pulse Rate 66  Resp 20  BP (!) 202/77  Oxygen Therapy  SpO2 100 %  During Hemodialysis Assessment  HD Safety Checks Performed Yes  KECN 51.3 KECN  Dialysis Fluid Bolus Normal Saline  Bolus Amount (mL) 250 mL  Intra-Hemodialysis Comments Tx completed;Tolerated well (Pt rqst end tx, r/t eye pain. MD ok'd )

## 2018-03-09 NOTE — Clinical Social Work Note (Signed)
Patient is a resident of [facility] where she is receiving short term rehab. Magda Paganini at [facility] states she can return at discharge. CSW full assessment to follow. Shela Leff MSW,LCSW [phone]

## 2018-03-09 NOTE — Progress Notes (Signed)
[facility]   Communication Note  The patient is well-known to our service. During the patients most recent hospital stay (approximately one week ago) she underwent a right below the knee amputation.   At that time, podiatry noted that her left foot was salvageable. Recommend consulting podiatry to reassess if left foot is still salvageable.   If not, we will plan for a left below the knee amputation this week with either Dr. Lucky Cowboy / Dr. Delana Meyer depending on schedule availability.  Thank you.   Marcelle Overlie PA-C 03/09/2018 2:49 PM

## 2018-03-09 NOTE — Progress Notes (Signed)
Post HD TX    03/09/18 1122  Vital Signs  Temp 98.6 F (37 C)  Temp Source Oral  Pulse Rate 66  Pulse Rate Source Monitor  Resp 20  BP (!) 187/88  BP Location Right Arm  BP Method Automatic  Patient Position (if appropriate) Sitting  Oxygen Therapy  SpO2 100 %  O2 Device Room Air  Pain Assessment  Pain Scale 0-10  Pain Score 8  Pain Type Acute pain  Pain Location Eye  Pain Orientation Left  Pain Intervention(s) MD notified (Comment);RN made aware (Pt is D/C from dialysis RN will intervene when pt arrives on)  Dialysis Weight  Weight 55 kg  Type of Weight Post-Dialysis  Post-Hemodialysis Assessment  Rinseback Volume (mL) 250 mL  KECN 51.3 V  Dialyzer Clearance Clear  Duration of HD Treatment -hour(s) 3 hour(s)  Hemodialysis Intake (mL) 500 mL  UF Total -Machine (mL) 2627 mL  Net UF (mL) 2127 mL  Tolerated HD Treatment Yes  Hemodialysis Catheter Left Subclavian Double-lumen  Placement Date: 02/11/18   Placed prior to admission: No  Orientation: Left  Access Location: Subclavian  Hemodialysis Catheter Type: Double-lumen  Site Condition No complications  Blue Lumen Status Heparin locked  Red Lumen Status Heparin locked  Catheter fill solution Heparin 1000 units/ml  Catheter fill volume (Arterial) 1.7 cc  Catheter fill volume (Venous) 1.7  Post treatment catheter status Capped and Clamped

## 2018-03-09 NOTE — Progress Notes (Signed)
Post HD assessment, lung sounds improved. Hypertension most likely due to eye pain, reported to MD and Primary RN,pt ok to D/C to floor.    03/09/18 1125  Neurological  Level of Consciousness Alert  Orientation Level Oriented X4  Respiratory  Respiratory Pattern Regular;Unlabored  Chest Assessment Chest expansion symmetrical  Bilateral Breath Sounds Diminished;Clear  Cough None  Cardiac  Pulse Regular  Heart Sounds S1, S2  ECG Monitor Yes  Cardiac Rhythm NSR  Vascular  R Radial Pulse +1  L Radial Pulse +1  Edema Generalized  Generalized Edema +2  Psychosocial  Psychosocial (WDL) WDL  Family Behavior Verbal  Emotional support given Given to patient

## 2018-03-09 NOTE — Progress Notes (Signed)
[place] [facility] [place] NAME: Stephanie Ellis    MR#:  [id number]  DATE OF BIRTH:  25-Mar-1951  SUBJECTIVE:  Patient here with elevated BP and SOB  Going for HD this am  REVIEW OF SYSTEMS:    Review of Systems  Constitutional: Negative for fever, chills weight loss HENT: Negative for ear pain, nosebleeds, facial swelling, rhinorrhea, neck pain, neck stiffness and ear discharge.   Nasal congestion Respiratory: Negative for cough, ++shortness of breath, wheezing  Cardiovascular: Negative for chest pain, palpitations and leg swelling.  Gastrointestinal: Negative for heartburn, abdominal pain, vomiting, diarrhea or consitpation Genitourinary: Negative for dysuria, urgency, frequency, hematuria Musculoskeletal: Negative for back pain or joint pain Neurological: Negative for dizziness, seizures, syncope, focal weakness,  numbness and headaches.  Hematological: Does not bruise/bleed easily.  Psychiatric/Behavioral: Negative for hallucinations, confusion, dysphoric mood    Tolerating Diet: yes      DRUG ALLERGIES:   Allergies  Allergen Reactions  . Chlorthalidone Anaphylaxis, Itching and Rash  . Fentanyl Rash  . Midazolam Rash  . Ace Inhibitors Other (See Comments)    Reaction:  Hyperkalemia, agitation   . Angiotensin Receptor Blockers Other (See Comments)    Reaction:  Hyperkalemia, agitation   . Norvasc [Amlodipine] Itching and Rash  . Phenergan [Promethazine Hcl] Anxiety    "antsy, can't sit still"    VITALS:  Blood pressure (!) 185/70, pulse 63, temperature 98.6 F (37 C), temperature source Oral, resp. rate (!) 33, height 5' 4"  (1.626 m), weight 57.2 kg, SpO2 100 %.  PHYSICAL EXAMINATION:  Constitutional: Appears well-developed and well-nourished. No distress. HENT: Normocephalic. Marland Kitchen Oropharynx is clear and moist.  Eyes: Conjunctivae and EOM are normal. PERRLA, no scleral icterus.  Neck: Normal ROM. Neck supple. No JVD. No  tracheal deviation. CVS: RRR, S1/S2 +, no murmurs, no gallops, no carotid bruit.  Pulmonary: Effort and breath sounds normal, no stridor, rhonchi, wheezes, rales.  Abdominal: Soft. BS +,  no distension, tenderness, rebound or guarding.  Musculoskeletal: Status post right BKA, left foot status post first toe amputated, second and third toes are gangrenous.    Neuro: Alert. CN 2-12 grossly intact. No focal deficits. Skin: Skin is warm and dry. No rash noted. Psychiatric: Normal mood and affect.      LABORATORY PANEL:   CBC Recent Labs  Lab 03/08/18 1349  WBC 11.1*  HGB 8.9*  HCT 29.3*  PLT 406*   ------------------------------------------------------------------------------------------------------------------  Chemistries  Recent Labs  Lab 03/08/18 1349  NA 140  K 4.0  CL 99  CO2 31  GLUCOSE 76  BUN 23  CREATININE 4.83*  CALCIUM 9.0   ------------------------------------------------------------------------------------------------------------------  Cardiac Enzymes Recent Labs  Lab 03/08/18 1349 03/08/18 1630  TROPONINI 0.05* 0.05*   ------------------------------------------------------------------------------------------------------------------  RADIOLOGY:  Dg Chest 2 View  Result Date: 03/08/2018 CLINICAL DATA:  Chest pain and pressure. EXAM: CHEST - 2 VIEW COMPARISON:  02/11/2018 FINDINGS: The cardio pericardial silhouette is enlarged. There is pulmonary vascular congestion without overt pulmonary edema. Atelectasis or infiltrate noted in the lung bases bilaterally. Bilateral pleural effusions evident, left greater than right. Left dialysis catheter tip overlies the right atrium. The visualized bony structures of the thorax are intact. IMPRESSION: Cardiomegaly with vascular congestion and bibasilar atelectasis or infiltrate. Small bilateral pleural effusions. Electronically Signed   By: [name] Stanley M.D.   On: 03/08/2018 14:56   Ct Orbits Wo Contrast  Result  Date: 03/08/2018 CLINICAL DATA:  67 year old female with left eye drainage  and pain for 1 week. EXAM: CT ORBITS WITHOUT CONTRAST TECHNIQUE: Multidetector CT images were obtained using the standard protocol without intravenous contrast. COMPARISON:  MRI brain 11/15/2017, head CTs 10/15/2017. FINDINGS: Orbits: Chronic postoperative changes to both globes, appear stable since the June CT. Globes remain intact. No definite asymmetric periorbital or preseptal soft tissue thickening or inflammation. No postseptal inflammation. Pronounced chronic calcified atherosclerosis of the ophthalmic arteries stable since June (series 3, image 35). Bilateral orbital walls are intact. Other osseous structures: No acute osseous abnormality identified. 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 Chronic mild bubbly opacity in the right sphenoid. The anterior ethmoids, frontal and maxillary sinuses remain clear. Tympanic cavities remain clear. New mild left mastoid effusion. Right mastoids remain clear. Soft tissues: Pronounced bilateral scalp calcified atherosclerosis. Noncontrast visible pharynx and parapharyngeal spaces are within normal limits. Limited intracranial: Bilateral carotid and vertebral artery calcified atherosclerosis. Stable visible noncontrast brain parenchyma. IMPRESSION: 1. No acute orbital findings evident on noncontrast CT. Stable postoperative changes to both globes. 2. Advanced calcified atherosclerosis, including chronic involvement of the bilateral ophthalmic arteries and scalp vessels, suggesting chronic renal failure. 3. 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 There is a new mild left mastoid effusion, nonspecific but typically postinflammatory. Electronically Signed   By: Genevie Ann M.D.   On: 03/08/2018 16:14     ASSESSMENT AND PLAN:   67 y.o. 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  1.    Acute hypoxic respiratory failure due to pulmonary edema from end-stage renal disease needing dialysis:  Patient will undergo dialysis and oxygen to be weaned off   2.  Hypertensive urgency: Blood pressure has improved and will continue to improve after dialysis  Continue home medications including isosorbide and Coreg .  3.  Diabetes mellitus: Continue ADA diet with Lantus and sliding scale  4.  Left eye blurred vision-appreciate ophthalmology input in the ED.  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  She had prior laser capsulotomy.  Eyedrops recommended at this time.  She will need pars plano vitrectomy-recommended outpatient follow-up after discharge.  5.  Peripheral arterial disease: Left foot second and third toes are gangrenous now. Vascular surgery consultation requested and plan for possible amputation this week.  6.  End-stage renal disease on hemodialysis: She will continue with her normal dialysis schedule Tuesday, Thursday and Saturday   7.    History of tricuspid endocarditis-with MSSA.  Currently on cefazolin with dialysis.  Continue until 03/13/2018.   D/w dr Abigail Butts   Management plans discussed with the patient and she is in agreement.  CODE STATUS: full  TOTAL TIME TAKING CARE OF THIS PATIENT: 30 minutes.     POSSIBLE D/C 3-5 days, DEPENDING ON CLINICAL CONDITION.   Sacheen Arrasmith M.D on 03/09/2018 at 11:21 AM  Between 7am to 6pm - Pager - [phone] After 6pm go to [email or web address] - password [email or web address] [facility]  Office  [phone]  CC: Primary  care physician; [employer]  Note: This dictation was prepared with Dragon dictation along with smaller phrase technology. Any transcriptional errors  that result from this process are unintentional.

## 2018-03-10 ENCOUNTER — Encounter: Payer: Self-pay | Admitting: Vascular Surgery

## 2018-03-10 DIAGNOSIS — I70262 Atherosclerosis of native arteries of extremities with gangrene, left leg: Secondary | ICD-10-CM

## 2018-03-10 LAB — CBC
HCT: 30.3 % — ABNORMAL LOW (ref 36.0–46.0)
HCT: 28 % — ABNORMAL LOW (ref 36.0–46.0)
Hemoglobin: 9 g/dL — ABNORMAL LOW (ref 12.0–15.0)
Hemoglobin: 8.3 g/dL — ABNORMAL LOW (ref 12.0–15.0)
MCH: 30.5 pg (ref 26.0–34.0)
MCH: 30.5 pg (ref 26.0–34.0)
MCHC: 29.7 g/dL — ABNORMAL LOW (ref 30.0–36.0)
MCHC: 29.6 g/dL — ABNORMAL LOW (ref 30.0–36.0)
MCV: 102.7 fL — ABNORMAL HIGH (ref 80.0–100.0)
MCV: 102.9 fL — ABNORMAL HIGH (ref 80.0–100.0)
Platelets: 373 10*3/uL (ref 150–400)
Platelets: 353 10*3/uL (ref 150–400)
RBC: 2.95 MIL/uL — ABNORMAL LOW (ref 3.87–5.11)
RBC: 2.72 MIL/uL — ABNORMAL LOW (ref 3.87–5.11)
RDW: 20 % — ABNORMAL HIGH (ref 11.5–15.5)
RDW: 19.8 % — ABNORMAL HIGH (ref 11.5–15.5)
WBC: 9 10*3/uL (ref 4.0–10.5)
WBC: 9.4 10*3/uL (ref 4.0–10.5)
nRBC: 0 % (ref 0.0–0.2)
nRBC: 0 % (ref 0.0–0.2)

## 2018-03-10 LAB — BASIC METABOLIC PANEL
Anion gap: 10 (ref 5–15)
BUN: 23 mg/dL (ref 8–23)
CO2: 31 mmol/L (ref 22–32)
Calcium: 8.5 mg/dL — ABNORMAL LOW (ref 8.9–10.3)
Chloride: 100 mmol/L (ref 98–111)
Creatinine, Ser: 5.07 mg/dL — ABNORMAL HIGH (ref 0.44–1.00)
GFR calc Af Amer: 9 mL/min — ABNORMAL LOW (ref >60)
GFR calc non Af Amer: 8 mL/min — ABNORMAL LOW (ref >60)
Glucose, Bld: 131 mg/dL — ABNORMAL HIGH (ref 70–99)
Potassium: 3.9 mmol/L (ref 3.5–5.1)
Sodium: 141 mmol/L (ref 135–145)

## 2018-03-10 LAB — GLUCOSE, CAPILLARY
Glucose-Capillary: 145 mg/dL — ABNORMAL HIGH (ref 70–99)
Glucose-Capillary: 173 mg/dL — ABNORMAL HIGH (ref 70–99)
Glucose-Capillary: 49 mg/dL — ABNORMAL LOW (ref 70–99)
Glucose-Capillary: 51 mg/dL — ABNORMAL LOW (ref 70–99)
Glucose-Capillary: 88 mg/dL (ref 70–99)

## 2018-03-10 LAB — MRSA PCR SCREENING: MRSA by PCR: NEGATIVE

## 2018-03-10 LAB — TROPONIN I
Troponin I: 0.04 ng/mL (ref <0.03)
Troponin I: 0.04 ng/mL (ref <0.03)
Troponin I: 0.04 ng/mL (ref <0.03)

## 2018-03-10 MED ORDER — ACETAMINOPHEN 325 MG PO TABS
650.0000 mg | ORAL_TABLET | Freq: Four times a day (QID) | ORAL | Status: DC | PRN
  Administered 2018-03-10 – 2018-03-13 (×4): 650 mg via ORAL
  Filled 2018-03-10 (×5): qty 2

## 2018-03-10 MED ORDER — INSULIN ASPART 100 UNIT/ML ~~LOC~~ SOLN: 0.0000 [IU] | Freq: Every day | SUBCUTANEOUS | Status: DC

## 2018-03-10 MED ORDER — EPOETIN ALFA 10000 UNIT/ML IJ SOLN
10000.0000 [IU] | INTRAMUSCULAR | Status: DC
  Administered 2018-03-10 – 2018-03-17 (×4): 10000 [IU] via INTRAVENOUS

## 2018-03-10 MED ORDER — FOLIC ACID 1 MG PO TABS
1.0000 mg | ORAL_TABLET | Freq: Every day | ORAL | Status: DC
  Administered 2018-03-10 – 2018-03-16 (×6): 1 mg via ORAL
  Filled 2018-03-10 (×6): qty 1

## 2018-03-10 MED ORDER — RENA-VITE PO TABS
1.0000 | ORAL_TABLET | Freq: Every day | ORAL | Status: DC
  Administered 2018-03-10 – 2018-03-16 (×6): 1 via ORAL
  Filled 2018-03-10 (×6): qty 1

## 2018-03-10 MED ORDER — APIXABAN 2.5 MG PO TABS
2.5000 mg | ORAL_TABLET | Freq: Two times a day (BID) | ORAL | Status: DC
  Administered 2018-03-10 – 2018-03-18 (×15): 2.5 mg via ORAL
  Filled 2018-03-10 (×16): qty 1

## 2018-03-10 MED ORDER — ASPIRIN EC 81 MG PO TBEC
81.0000 mg | DELAYED_RELEASE_TABLET | Freq: Every day | ORAL | Status: DC
  Administered 2018-03-10 – 2018-03-18 (×8): 81 mg via ORAL
  Filled 2018-03-10 (×9): qty 1

## 2018-03-10 MED ORDER — INSULIN ASPART 100 UNIT/ML ~~LOC~~ SOLN
0.0000 [IU] | Freq: Three times a day (TID) | SUBCUTANEOUS | Status: DC
  Administered 2018-03-10: 2 [IU] via SUBCUTANEOUS
  Filled 2018-03-10: qty 1

## 2018-03-10 MED ORDER — ACETAMINOPHEN 650 MG RE SUPP: 650.0000 mg | Freq: Four times a day (QID) | RECTAL | Status: DC | PRN

## 2018-03-10 MED ORDER — ONDANSETRON HCL 4 MG PO TABS: 4.0000 mg | ORAL_TABLET | Freq: Four times a day (QID) | ORAL | Status: DC | PRN

## 2018-03-10 MED ORDER — ATORVASTATIN CALCIUM 80 MG PO TABS
80.0000 mg | ORAL_TABLET | Freq: Every day | ORAL | Status: DC
  Administered 2018-03-10 – 2018-03-17 (×7): 80 mg via ORAL
  Filled 2018-03-10 (×2): qty 1
  Filled 2018-03-10 (×2): qty 4
  Filled 2018-03-10: qty 1
  Filled 2018-03-10: qty 4
  Filled 2018-03-10 (×3): qty 1
  Filled 2018-03-10: qty 4
  Filled 2018-03-10 (×3): qty 1
  Filled 2018-03-10: qty 4

## 2018-03-10 MED ORDER — OMEGA-3-ACID ETHYL ESTERS 1 G PO CAPS
1.0000 | ORAL_CAPSULE | Freq: Every day | ORAL | Status: DC
  Administered 2018-03-10 – 2018-03-14 (×4): 1 g via ORAL
  Filled 2018-03-10 (×6): qty 1

## 2018-03-10 MED ORDER — PRO-STAT SUGAR FREE PO LIQD
30.0000 mL | Freq: Three times a day (TID) | ORAL | Status: DC
  Administered 2018-03-10 – 2018-03-16 (×6): 30 mL via ORAL

## 2018-03-10 MED ORDER — SEVELAMER CARBONATE 800 MG PO TABS
800.0000 mg | ORAL_TABLET | Freq: Three times a day (TID) | ORAL | Status: DC
  Administered 2018-03-10 – 2018-03-18 (×14): 800 mg via ORAL
  Filled 2018-03-10 (×15): qty 1

## 2018-03-10 MED ORDER — CARVEDILOL 6.25 MG PO TABS
6.2500 mg | ORAL_TABLET | Freq: Two times a day (BID) | ORAL | Status: DC
  Administered 2018-03-10 – 2018-03-18 (×7): 6.25 mg via ORAL
  Filled 2018-03-10 (×9): qty 1

## 2018-03-10 MED ORDER — ONDANSETRON HCL 4 MG/2ML IJ SOLN
4.0000 mg | Freq: Four times a day (QID) | INTRAMUSCULAR | Status: DC | PRN
  Administered 2018-03-13 – 2018-03-14 (×3): 4 mg via INTRAVENOUS
  Filled 2018-03-10 (×3): qty 2

## 2018-03-10 MED ORDER — FAMOTIDINE 20 MG PO TABS
20.0000 mg | ORAL_TABLET | Freq: Two times a day (BID) | ORAL | Status: DC
  Administered 2018-03-10: 20 mg via ORAL
  Filled 2018-03-10: qty 1

## 2018-03-10 MED ORDER — INSULIN GLARGINE 100 UNIT/ML ~~LOC~~ SOLN
5.0000 [IU] | Freq: Every day | SUBCUTANEOUS | Status: DC
  Administered 2018-03-11 – 2018-03-12 (×2): 5 [IU] via SUBCUTANEOUS
  Filled 2018-03-10 (×4): qty 0.05

## 2018-03-10 MED ORDER — CALCITRIOL 0.25 MCG PO CAPS
0.5000 ug | ORAL_CAPSULE | Freq: Every day | ORAL | Status: DC
  Administered 2018-03-10 – 2018-03-13 (×3): 0.5 ug via ORAL
  Filled 2018-03-10 (×5): qty 2

## 2018-03-10 MED ORDER — ISOSORBIDE MONONITRATE ER 30 MG PO TB24
30.0000 mg | ORAL_TABLET | Freq: Every day | ORAL | Status: DC
  Administered 2018-03-10 – 2018-03-13 (×3): 30 mg via ORAL
  Filled 2018-03-10 (×3): qty 1

## 2018-03-10 MED ORDER — DOCUSATE SODIUM 100 MG PO CAPS
100.0000 mg | ORAL_CAPSULE | Freq: Two times a day (BID) | ORAL | Status: DC
  Administered 2018-03-10 – 2018-03-18 (×13): 100 mg via ORAL
  Filled 2018-03-10 (×14): qty 1

## 2018-03-10 MED ORDER — VITAMIN C 500 MG PO TABS
500.0000 mg | ORAL_TABLET | Freq: Two times a day (BID) | ORAL | Status: DC
  Administered 2018-03-10 – 2018-03-18 (×12): 500 mg via ORAL
  Filled 2018-03-10 (×15): qty 1

## 2018-03-10 MED ORDER — POLYVINYL ALCOHOL 1.4 % OP SOLN
1.0000 [drp] | Freq: Two times a day (BID) | OPHTHALMIC | Status: DC
  Administered 2018-03-10 – 2018-03-18 (×17): 1 [drp] via OPHTHALMIC
  Filled 2018-03-10 (×3): qty 15

## 2018-03-10 MED ORDER — HYDRALAZINE HCL 50 MG PO TABS
25.0000 mg | ORAL_TABLET | Freq: Three times a day (TID) | ORAL | Status: DC
  Administered 2018-03-10 – 2018-03-14 (×9): 25 mg via ORAL
  Filled 2018-03-10 (×10): qty 1

## 2018-03-10 MED ORDER — NEPRO/CARBSTEADY PO LIQD
237.0000 mL | Freq: Two times a day (BID) | ORAL | Status: DC
  Administered 2018-03-10 – 2018-03-16 (×5): 237 mL via ORAL

## 2018-03-10 MED ORDER — ALTEPLASE 2 MG IJ SOLR
INTRAMUSCULAR | Status: DC | PRN
  Administered 2018-01-30: 6 mg

## 2018-03-10 MED ORDER — ERYTHROMYCIN 5 MG/GM OP OINT
TOPICAL_OINTMENT | Freq: Four times a day (QID) | OPHTHALMIC | Status: DC
  Administered 2018-03-10 – 2018-03-11 (×3): via OPHTHALMIC
  Administered 2018-03-11: 1 via OPHTHALMIC
  Administered 2018-03-12 – 2018-03-13 (×7): via OPHTHALMIC
  Administered 2018-03-13: 1 via OPHTHALMIC
  Administered 2018-03-13 – 2018-03-14 (×3): via OPHTHALMIC
  Administered 2018-03-15: 1 via OPHTHALMIC
  Administered 2018-03-15: 14:00:00 via OPHTHALMIC
  Administered 2018-03-15 – 2018-03-16 (×3): 1 via OPHTHALMIC
  Administered 2018-03-16: 18:00:00 via OPHTHALMIC
  Administered 2018-03-16 – 2018-03-17 (×4): 1 via OPHTHALMIC
  Administered 2018-03-18: 12:00:00 via OPHTHALMIC
  Administered 2018-03-18: 1 via OPHTHALMIC
  Filled 2018-03-10 (×2): qty 1

## 2018-03-10 MED ORDER — CARVEDILOL 3.125 MG PO TABS
3.1250 mg | ORAL_TABLET | Freq: Two times a day (BID) | ORAL | Status: DC
  Administered 2018-03-10: 3.125 mg via ORAL
  Filled 2018-03-10 (×2): qty 1

## 2018-03-10 MED ORDER — OXYCODONE HCL 5 MG PO TABS
5.0000 mg | ORAL_TABLET | Freq: Four times a day (QID) | ORAL | Status: DC | PRN
  Administered 2018-03-11 – 2018-03-12 (×2): 5 mg via ORAL
  Filled 2018-03-10 (×2): qty 1

## 2018-03-10 MED ORDER — MIRTAZAPINE 15 MG PO TABS
7.5000 mg | ORAL_TABLET | Freq: Every day | ORAL | Status: DC
  Administered 2018-03-10 – 2018-03-17 (×7): 7.5 mg via ORAL
  Filled 2018-03-10 (×7): qty 1

## 2018-03-10 MED ORDER — FAMOTIDINE 20 MG PO TABS
20.0000 mg | ORAL_TABLET | Freq: Every day | ORAL | Status: DC
  Administered 2018-03-11 – 2018-03-18 (×8): 20 mg via ORAL
  Filled 2018-03-10 (×9): qty 1

## 2018-03-10 NOTE — Progress Notes (Signed)
[facility]  ROUNDING NOTE   Subjective:   Seen and examined on hemodialysis. Tolerating treatment well. UF goal of 1.5 liters  Extra treatment yesterday with UF of 2.110m. Hypertensive during that treatment.     HEMODIALYSIS FLOWSHEET:  Blood Flow Rate (mL/min): 400 mL/min Arterial Pressure (mmHg): -170 mmHg Venous Pressure (mmHg): 120 mmHg Transmembrane Pressure (mmHg): 50 mmHg Ultrafiltration Rate (mL/min): 650 mL/min Dialysate Flow Rate (mL/min): 600 ml/min Conductivity: Machine : 14.2 Conductivity: Machine : 14.2 Dialysis Fluid Bolus: Normal Saline Bolus Amount (mL): 250 mL    Objective:  Vital signs in last 24 hours:  Temp:  [98.3 F (36.8 C)-98.8 F (37.1 C)] 98.7 F (37.1 C) (11/19 0900) Pulse Rate:  [58-73] 64 (11/19 0930) Resp:  [5-33] 12 (11/19 0930) BP: (122-202)/(45-89) 144/47 (11/19 0930) SpO2:  [93 %-100 %] 98 % (11/19 0930) Weight:  [55 kg-60.3 kg] 60.3 kg (11/19 0900)  Weight change: 0.5 kg Filed Weights   03/09/18 0825 03/09/18 1122 03/10/18 0900  Weight: 57.2 kg 55 kg 60.3 kg    Intake/Output: I/O last 3 completed shifts: In: 570[IV Piggyback:50] Out: 2127 [Other:2127]   Intake/Output this shift:  No intake/output data recorded.  Physical Exam: General: NAD,   Head: Normocephalic, atraumatic. Moist oral mucosal membranes  Eyes: Anicteric, PERRL  Neck: Supple, trachea midline  Lungs:  Crackles bilaterally  Heart: Regular rate and rhythm  Abdomen:  Soft, nontender,   Extremities:  no edema, right BKA, left with ischemic changes  Neurologic: Nonfocal, moving all four extremities  Skin: No lesions  Access: Left IJ permcath    Basic Metabolic Panel: Recent Labs  Lab 03/08/18 1349  NA 140  K 4.0  CL 99  CO2 31  GLUCOSE 76  BUN 23  CREATININE 4.83*  CALCIUM 9.0    Liver Function Tests: No results for input(s): AST, ALT, ALKPHOS, BILITOT, PROT, ALBUMIN in the last 168 hours. No results for input(s): LIPASE,  AMYLASE in the last 168 hours. No results for input(s): AMMONIA in the last 168 hours.  CBC: Recent Labs  Lab 03/08/18 1349 03/10/18 0302  WBC 11.1* 9.0  HGB 8.9* 9.0*  HCT 29.3* 30.3*  MCV 101.4* 102.7*  PLT 406* 373    Cardiac Enzymes: Recent Labs  Lab 03/08/18 1349 03/08/18 1630  TROPONINI 0.05* 0.05*    BNP: Invalid input(s): POCBNP  CBG: No results for input(s): GLUCAP in the last 168 hours.  Microbiology: Results for orders placed or performed during the hospital encounter of 02/18/18  Blood Culture (routine x 2)     Status: None   Collection Time: 02/18/18  7:55 AM  Result Value Ref Range Status   Specimen Description [facility] Final   Special Requests   Final    BOTTLES DRAWN AEROBIC AND ANAEROBIC Blood Culture results may not be optimal due to an excessive volume of blood received in culture bottles   Culture   Final    NO GROWTH 5 DAYS [facility] [address] [place] [id number]   Report Status 02/23/2018 FINAL  Final  Blood Culture (routine x 2)     Status: None   Collection Time: 02/18/18  7:55 AM  Result Value Ref Range Status   Specimen Description BLOOD RIGHT FA  Final   Special Requests   Final    BOTTLES DRAWN AEROBIC AND ANAEROBIC Blood Culture results may not be optimal due to an excessive volume of blood received in culture  bottles   Culture   Final    NO GROWTH 5 DAYS Performed at [facility], [address] [place], [place] [address]    Report Status 02/23/2018 FINAL  Final    Coagulation Studies: No results for input(s): LABPROT, INR in the last 72 hours.  Urinalysis: No results for input(s): COLORURINE, LABSPEC, PHURINE, GLUCOSEU, HGBUR, BILIRUBINUR, KETONESUR, PROTEINUR, UROBILINOGEN, NITRITE, LEUKOCYTESUR in the last 72 hours.  Invalid input(s): APPERANCEUR    Imaging: Dg Chest 2 View  Result Date: 03/08/2018 CLINICAL DATA:  Chest pain and pressure. EXAM: CHEST - 2 VIEW  COMPARISON:  02/11/2018 FINDINGS: The cardio pericardial silhouette is enlarged. There is pulmonary vascular congestion without overt pulmonary edema. Atelectasis or infiltrate noted in the lung bases bilaterally. Bilateral pleural effusions evident, left greater than right. Left dialysis catheter tip overlies the right atrium. The visualized bony structures of the thorax are intact. IMPRESSION: Cardiomegaly with vascular congestion and bibasilar atelectasis or infiltrate. Small bilateral pleural effusions. Electronically Signed   By: [name] Stanley M.D.   On: 03/08/2018 14:56   Ct Orbits Wo Contrast  Result Date: 03/08/2018 CLINICAL DATA:  67 year old female with left eye drainage and pain for 1 week. EXAM: CT ORBITS WITHOUT CONTRAST TECHNIQUE: Multidetector CT images were obtained using the standard protocol without intravenous contrast. COMPARISON:  MRI brain 11/15/2017, head CTs 10/15/2017. FINDINGS: Orbits: Chronic postoperative changes to both globes, appear stable since the June CT. Globes remain intact. No definite asymmetric periorbital or preseptal soft tissue thickening or inflammation. No postseptal inflammation. Pronounced chronic calcified atherosclerosis of the ophthalmic arteries stable since June (series 3, image 35). Bilateral orbital walls are intact. Other osseous structures: No acute osseous abnormality identified. 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 Chronic mild bubbly opacity in the right sphenoid. The anterior ethmoids, frontal and maxillary sinuses remain clear. Tympanic cavities remain clear. New mild left mastoid effusion. Right mastoids remain clear. Soft tissues: Pronounced bilateral scalp calcified atherosclerosis. Noncontrast visible pharynx and parapharyngeal spaces are within normal limits. Limited intracranial: Bilateral carotid and vertebral artery calcified atherosclerosis. Stable visible  noncontrast brain parenchyma. IMPRESSION: 1. No acute orbital findings evident on noncontrast CT. Stable postoperative changes to both globes. 2. Advanced calcified atherosclerosis, including chronic involvement of the bilateral ophthalmic arteries and scalp vessels, suggesting chronic renal failure. 3. 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 There is a new mild left mastoid effusion, nonspecific but typically postinflammatory. Electronically Signed   By: Genevie Ann M.D.   On: 03/08/2018 16:14     Medications:   . sodium chloride    . sodium chloride    . [START ON 03/14/2018]  ceFAZolin (ANCEF) IV    .  ceFAZolin (ANCEF) IV     . apixaban  2.5 mg Oral BID  . aspirin EC  81 mg Oral Daily  . atorvastatin  80 mg Oral QHS  . brimonidine  1 drop Both Eyes TID  . calcitRIOL  0.5 mcg Oral Daily  . carvedilol  3.125 mg Oral BID  . Chlorhexidine Gluconate Cloth  6 each Topical Q0600  . docusate sodium  100 mg Oral BID  . famotidine  20 mg Oral BID  . feeding supplement (NEPRO CARB STEADY)  237 mL Oral BID BM  . feeding supplement (PRO-STAT SUGAR FREE 64)  30 mL Oral TID  . folic acid  1 mg Oral Daily  . insulin glargine  5 Units Subcutaneous QHS  .  isosorbide mononitrate  30 mg Oral Daily  . mirtazapine  7.5 mg Oral QHS  . multivitamin  1 tablet Oral Daily  . omega-3 acid ethyl esters  1 capsule Oral Daily  . polyvinyl alcohol  1 drop Both Eyes BID  . sevelamer carbonate  800 mg Oral TID WC  . timolol  1 drop Both Eyes TID  . vitamin C  500 mg Oral BID   sodium chloride, sodium chloride, acetaminophen **OR** acetaminophen, alteplase, heparin, hydrALAZINE, lidocaine (PF), lidocaine-prilocaine, LORazepam **OR** LORazepam, ondansetron **OR** ondansetron (ZOFRAN) IV, oxyCODONE, oxyCODONE-acetaminophen, pentafluoroprop-tetrafluoroeth, sodium chloride  Assessment/ Plan:  [name] is a 67 y.o. black female with end stage renal disease on hemodialysis, hypertension,  congestive heart failure, coronary artery disease status post CABG, diabetes mellitus type II insulin dependent, GERD, peripheral vascular disease status post right BKA.  [facility] [place] TTS hemodialysis RIJ permcath  1. End Stage Renal Disease emergent hemodialysis treatment on admission for hypertensive urgency Seen and examined on regularly scheduled dialysis treatment  2. Ischemic limb with osteomyelitis and endocarditis - Cefazolin 2g until 11/22.  3. Anemia of chronic kidney disease: hemoglobin 9 - EPO 10000 units with hemodialysis treatment TTS  4. Hypotension: actually with hypertensive this admission. Holding midodrine.   5. Secondary Hyperparathyroidism: holding sevelamer due to hypophosphatemia   LOS: 2 Stephanie Ellis 11/19/20199:34 AM

## 2018-03-10 NOTE — Progress Notes (Signed)
Was asked to give opinion in regards to salvage of left foot.  Vascular surgery has evaluated and has recommended below the knee amputation.  Patient was seen in dialysis today.  Evaluation of the left foot reveals the previous great toe amputation site has noted necrosis dorsally along the entire midfoot with mummified gangrenous changes of the second toe as well.  The amount of full-thickness necrotic tissue on the dorsal foot extends through the midfoot with an area of multiple skin islands of continued necrotic tissue to the level of the ankle.  It is my opinion that this is nota salvageable foot as local foot amputation at the level of the transmetatarsal or midtarsal joint would still leave the necrotic skin island on the anterior aspect of the ankle which will have severe difficulty in healing and likely will not heal.  I agree that her best option is below the knee amputation.  At this point will sign off.  Please reconsult for any questions or concerns.

## 2018-03-10 NOTE — NC FL2 (Signed)
[place] LEVEL OF CARE SCREENING TOOL     IDENTIFICATION  Patient Name: Stephanie Ellis Birthdate: 1950-09-13 Sex: female Admission Date (Current Location): 03/08/2018  [facility] and [place] Number:  Engineering geologist and Address:  [facility], [address] [place],  [address]      Provider Number: [id number]  Attending Physician Name and Address:  [name], MD  Relative Name and Phone Number:       Current Level of Care: Hospital Recommended Level of Care: [place] Prior Approval Number:    Date Approved/Denied:   PASRR Number:    Discharge Plan: SNF    Current Diagnoses: Patient Active Problem List   Diagnosis Date Noted  . Hypertensive urgency 03/08/2018  . Ischemia of extremity 02/18/2018  . ESRD (end stage renal disease) ([place])   . Diabetic osteomyelitis ([place])   . Peripheral vascular disease ([place])   . Advanced care planning/counseling discussion   . Palliative care by specialist   . Goals of care, counseling/discussion   . Diabetes mellitus, type 2 ([place]) 01/30/2018  . Sepsis ([place]) 01/29/2018  . Peripheral neuropathy 06/17/2017  . Encephalopathy 02/06/2017  . Hypertensive emergency 02/06/2017  . Endophthalmitis, left eye 12/05/2016  . Vomiting 10/24/2016  . Hematuria 10/17/2016  . Acute lower UTI 10/17/2016  . Anesthesia complication 99/37/1696  . Chest pain with moderate risk for cardiac etiology 09/04/2016  . Steal syndrome dialysis vascular access, initial encounter ([place]) 06/18/2016  . 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 Coronary artery disease involving coronary bypass graft of native heart with angina pectoris ([place]) 02/10/2016  . Hypertension 02/02/2016  . Hyperlipidemia 02/02/2016  . Coronary artery disease involving left main coronary artery 10/04/2015  . Abdominal pain of unknown etiology 10/04/2015  . ESRD (end stage renal disease) on dialysis ([place])    . Type II diabetes mellitus with complication ([place])   . NSTEMI (non-ST elevated myocardial infarction) ([place])   . Right sided abdominal pain   . Elevated troponin 10/03/2015  . 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 Chronic diastolic congestive heart failure ([place]) 04/19/2014  . Stage 5 chronic kidney disease on chronic dialysis ([place]) 04/19/2014  . Anemia 09/20/2013  . Routine health maintenance 01/29/2013    Orientation RESPIRATION BLADDER Height & Weight     Self, Time, Situation, Place  Normal Continent Weight: 129 lb 13.6 oz (58.9 kg) Height:  5' 4"  (162.6 cm)  BEHAVIORAL SYMPTOMS/MOOD NEUROLOGICAL BOWEL NUTRITION STATUS  (none) (none) Continent Diet  AMBULATORY STATUS COMMUNICATION OF NEEDS Skin   Total Care Verbally Surgical wounds                       Personal Care Assistance Level of Assistance  Dressing, Feeding, Bathing Bathing Assistance: Limited assistance Feeding assistance: Limited assistance Dressing Assistance: Limited assistance     Functional Limitations Info  (none)          SPECIAL CARE FACTORS FREQUENCY  PT (By licensed PT)                    Contractures Contractures Info: Not present    Additional Factors Info  Code Status Code Status Info: full             Current Medications (03/10/2018):  This is the current hospital active medication list Current Facility-Administered Medications  Medication Dose Route Frequency Provider Last Rate Last Dose  . acetaminophen (TYLENOL) tablet 650 mg  650 mg  Oral Q6H PRN Gladstone Lighter, MD       Or  . acetaminophen (TYLENOL) suppository 650 mg  650 mg Rectal Q6H PRN Gladstone Lighter, MD      . apixaban Arne Cleveland) tablet 2.5 mg  2.5 mg Oral BID Gladstone Lighter, MD   2.5 mg at 03/10/18 1251  . aspirin EC tablet 81 mg  81 mg Oral Daily Gladstone Lighter, MD   81 mg at 03/10/18 1245  . atorvastatin (LIPITOR) tablet 80 mg  80 mg Oral QHS Gladstone Lighter, MD      . brimonidine (ALPHAGAN) 0.2 %  ophthalmic solution 1 drop  1 drop Both Eyes TID Gladstone Lighter, MD   1 drop at 03/10/18 0809  . calcitRIOL (ROCALTROL) capsule 0.5 mcg  0.5 mcg Oral Daily Gladstone Lighter, MD   0.5 mcg at 03/10/18 1245  . carvedilol (COREG) tablet 6.25 mg  6.25 mg Oral BID Gouru, Illene Silver, MD      . Derrill Memo ON 03/14/2018] ceFAZolin (ANCEF) 3 g in dextrose 5 % 50 mL IVPB  3 g Intravenous Q Sat-1800 Dallie Piles, RPH      . ceFAZolin (ANCEF) IVPB 2g/100 mL premix  2 g Intravenous Once per day on Tue Thu Dallie Piles, [facility]      . Chlorhexidine Gluconate Cloth 2 % PADS 6 each  6 each Topical Q0600 Mody, Sital, MD      . docusate sodium (COLACE) capsule 100 mg  100 mg Oral BID Gladstone Lighter, MD   100 mg at 03/10/18 1245  . epoetin alfa (EPOGEN,PROCRIT) injection 10,000 Units  10,000 Units Intravenous Q T,Th,Sa-HD Lavonia Dana, MD   10,000 Units at 03/10/18 0943  . erythromycin ophthalmic ointment   Left Eye Q6H [name], MD   Stopped at 03/10/18 1358  . [START ON 03/11/2018] famotidine (PEPCID) tablet 20 mg  20 mg Oral Daily Dallie Piles, RPH      . feeding supplement (NEPRO CARB STEADY) liquid 237 mL  237 mL Oral BID BM Gouru, Aruna, MD   237 mL at 03/10/18 1252  . feeding supplement (PRO-STAT SUGAR FREE 64) liquid 30 mL  30 mL Oral TID Gouru, Aruna, MD      . folic acid (FOLVITE) tablet 1 mg  1 mg Oral Daily Gladstone Lighter, MD   1 mg at 03/10/18 1245  . hydrALAZINE (APRESOLINE) injection 10 mg  10 mg Intravenous Q6H PRN Gladstone Lighter, MD   10 mg at 03/10/18 1346  . hydrALAZINE (APRESOLINE) tablet 25 mg  25 mg Oral Q8H [name], MD   Stopped at 03/10/18 1358  . insulin aspart (novoLOG) injection 0-5 Units  0-5 Units Subcutaneous QHS Dallie Piles, RPH      . insulin aspart (novoLOG) injection 0-9 Units  0-9 Units Subcutaneous TID WC Dallie Piles, RPH      . insulin glargine (LANTUS) injection 5 Units  5 Units Subcutaneous QHS Gladstone Lighter, MD      . isosorbide mononitrate  (IMDUR) 24 hr tablet 30 mg  30 mg Oral Daily Gladstone Lighter, MD   30 mg at 03/10/18 1251  . LORazepam (ATIVAN) tablet 0.5 mg  0.5 mg Oral Q6H PRN Bettey Costa, MD   0.5 mg at 03/09/18 1227   Or  . LORazepam (ATIVAN) injection 0.5 mg  0.5 mg Intramuscular Q6H PRN Mody, Sital, MD      . mirtazapine (REMERON) tablet 7.5 mg  7.5 mg Oral QHS Gladstone Lighter, MD      .  multivitamin (RENA-VIT) tablet 1 tablet  1 tablet Oral Daily Gladstone Lighter, MD   1 tablet at 03/10/18 1245  . omega-3 acid ethyl esters (LOVAZA) capsule 1 g  1 capsule Oral Daily Gladstone Lighter, MD   1 g at 03/10/18 1245  . ondansetron (ZOFRAN) tablet 4 mg  4 mg Oral Q6H PRN Gladstone Lighter, MD       Or  . ondansetron (ZOFRAN) injection 4 mg  4 mg Intravenous Q6H PRN Gladstone Lighter, MD      . oxyCODONE (Oxy IR/ROXICODONE) immediate release tablet 5 mg  5 mg Oral Q6H PRN Gladstone Lighter, MD      . oxyCODONE-acetaminophen (PERCOCET/ROXICET) 5-325 MG per tablet 1-2 tablet  1-2 tablet Oral Q4H PRN Bettey Costa, MD   1 tablet at 03/09/18 1947  . polyvinyl alcohol (LIQUIFILM TEARS) 1.4 % ophthalmic solution 1 drop  1 drop Both Eyes BID Gladstone Lighter, MD   1 drop at 03/10/18 1248  . sevelamer carbonate (RENVELA) tablet 800 mg  800 mg Oral TID WC Gladstone Lighter, MD   800 mg at 03/10/18 1251  . sodium chloride (OCEAN) 0.65 % nasal spray 1 spray  1 spray Each Nare PRN Mody, Sital, MD      . timolol (TIMOPTIC) 0.5 % ophthalmic solution 1 drop  1 drop Both Eyes TID Gladstone Lighter, MD   1 drop at 03/10/18 0809  . vitamin C (ASCORBIC ACID) tablet 500 mg  500 mg Oral BID Gouru, Aruna, MD   500 mg at 03/10/18 1245     Discharge Medications: Please see discharge summary for a list of discharge medications.  Relevant Imaging Results:  Relevant Lab Results:   Additional Information    Shela Leff, LCSW

## 2018-03-10 NOTE — Progress Notes (Signed)
[facility]  Daily Progress Note   Subjective: Patient seen after dialysis. Patient complaining of continued left eye pain. States the drops she has been given temporarily improve her discomfort however this does not last long.   Patient has been seen by podiatry and understands that her left foot is not salvageable given its progressively worsening physical exam. We had a long discussion about moving forward with a left below the knee amputation. She understands this will remove the non-viable tissue from her body, give her the best chance at healing and the ability to walk in the future. Procedure, risks and benefits were explained to the patient. All her questions were answered. At this time, she wishes to proceed.     Objective: Vitals:   03/10/18 1200 03/10/18 1214 03/10/18 1222 03/10/18 1316  BP: (!) 175/66 (!) 165/85 (!) 184/63 (!) 171/61  Pulse: 65 66 66 69  Resp: 18 17 12 15   Temp:   98.7 F (37.1 C) 98.7 F (37.1 C)  TempSrc:   Oral Oral  SpO2: 99% 100% 100% 100%  Weight:   58.9 kg   Height:        Intake/Output Summary (Last 24 hours) at 03/10/2018 1342 Last data filed at 03/10/2018 1222 Gross per 24 hour  Intake -  Output 1518 ml  Net -1518 ml   Physical Exam: A&Ox3, NAD Left Eye: erythematous, swollen, tearing Left Permcath: Intact, no signs of infection noted. CV: RRR Pulmonary: CTA Bilaterally Abdomen: Soft, Nontender, Nondistended, (+) Bowel sounds Vascular:  Right BKA: Stump healing.  Left Lower Extremity: Great toe amputation site with dehiscence, necrosis to second two and toward midfoot. Multiple areas of patchy gangrenous changes to the ankle.    Laboratory: CBC    Component Value Date/Time   WBC 9.4 03/10/2018 0931   HGB 8.3 (L) 03/10/2018 0931   HCT 28.0 (L) 03/10/2018 0931   PLT 353 03/10/2018 0931   BMET    Component Value Date/Time   NA 141 03/10/2018 0931   K 3.9 03/10/2018 0931   CL 100 03/10/2018 0931   CO2 31  03/10/2018 0931   GLUCOSE 131 (H) 03/10/2018 0931   BUN 23 03/10/2018 0931   CREATININE 5.07 (H) 03/10/2018 0931   CALCIUM 8.5 (L) 03/10/2018 0931   GFRNONAA 8 (L) 03/10/2018 0931   GFRAA 9 (L) 03/10/2018 0931   Assessment/Planning: The patient is a 67 year old female with multiple medical issues including severe peripheral artery disease status post a right below the knee amputation approximately 2 weeks ago who now presents with a nonsalvageable left foot 1) Due to the progressively worsening status of the patient's left foot now being non-salvageable a left below the knee amputation will be planned for tomorrow with Dr. Lucky Cowboy. (See subjective part of note for conversation had with patient).  We will preop the patient. 2) Will consult anesthesia for any additional recommendations before surgery 3) Patient was seen by ophthalmologist when she was first evaluated in the ED however she continues to experience left eye pain.  Possibly reconsult ophthalmology?  Discussed with Dr. Ellis Parents Kaliah Haddaway PA-C 03/10/2018 1:42 PM

## 2018-03-10 NOTE — Progress Notes (Signed)
Initial Nutrition Assessment  DOCUMENTATION CODES:   Not applicable  INTERVENTION:   Nepro Shake po BID, each supplement provides 425 kcal and 19 grams protein  Magic cup TID with meals, each supplement provides 290 kcal and 9 grams of protein  Prostat liquid protein PO 30 ml TID, each supplement provides 100 kcal, 15 grams protein.  Rena-vite daily  Vitamin C 521m po BID  Liberalize diet   NUTRITION DIAGNOSIS:   Increased nutrient needs related to chronic illness(ESRD on HD, wound healing ) as evidenced by increased estimated needs.  GOAL:   Patient will meet greater than or equal to 90% of their needs  MONITOR:   PO intake, Supplement acceptance, Labs, Weight trends, Skin, I & O's  REASON FOR ASSESSMENT:   Consult Wound healing  ASSESSMENT:    67y.o. 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  RD familiar with this pt from multiple previous admits. Pt with fairly good appetite at times when she is feeling well and she is on a regular diet. Pt does not eat well on a renal diet. Pt eating 75-95% of meals during her last admit. RD will order Vitamin C supplementation to support wound healing and replace losses from HD; pt needs to continue on vitamin C after discharge. RD will liberalize diet and order supplements. Pt drank Ensure better than the Nepro during her last admit. RD will also try Prostat trial to see if patient will drink this. Per chart, pt with weight gain since last admit; unsure if this is fluid related. Pt scheduled to have HD today.   Medications reviewed and include: aspirin,  calcitriol, colace, pepcid, folic acid, insulin, remeron, rena-vite, omega 3, renvela, cefazolin  Labs reviewed: K 4.0 wnl, creat 4.83(H) Hgb 9.0(L), Hct 30.3(L), MCV 102.7(H), MCHC 29.7(L)  NUTRITION - FOCUSED PHYSICAL EXAM:    Most Recent Value  Orbital Region  No depletion  Upper Arm Region  Moderate depletion  Thoracic and Lumbar Region  Mild depletion  Buccal Region  Mild depletion  Temple Region  Mild depletion  Clavicle Bone Region  Mild depletion  Clavicle and Acromion Bone Region  Mild depletion  Scapular Bone Region  Mild depletion  Dorsal Hand  Mild depletion  Patellar Region  Mild depletion  Anterior Thigh Region  Mild depletion  Posterior Calf Region  Moderate depletion  Edema (RD Assessment)  Mild  Hair  Reviewed  Eyes  Reviewed  Mouth  Reviewed  Skin  Reviewed  Nails  Reviewed     Diet Order:   Diet Order            Diet regular Room service appropriate? Yes; Fluid consistency: Thin; Fluid restriction: 1200 mL Fluid  Diet effective now             EDUCATION NEEDS:   No education needs have been identified at this time  Skin:  Skin Assessment: Reviewed RN Assessment(incision R leg)  Last BM:  11/15  Height:   Ht Readings from Last 1 Encounters:  03/08/18 5' 4"  (1.626 m)    Weight:   Wt Readings from Last 1 Encounters:  03/09/18 55 kg    Ideal Body  Weight:  51.3 kg(adjusted for R BKA)  BMI:  Body mass index is 20.81 kg/m.  Estimated Nutritional Needs:   Kcal:  1400-1600kcal/day   Protein:  77-88g/day   Fluid:  UOP + 1L  Koleen Distance MS, RD, LDN Pager #- [phone] Office#- [phone] After Hours Pager: [phone]

## 2018-03-10 NOTE — Progress Notes (Signed)
Per MD okay for RN to DC telemetry order.

## 2018-03-10 NOTE — Progress Notes (Signed)
HD tx start    03/10/18 0907  Vital Signs  Pulse Rate (!) 59  Pulse Rate Source Monitor  Resp 15  BP (!) 141/48  BP Location Right Arm  BP Method Automatic  Patient Position (if appropriate) Lying  Oxygen Therapy  SpO2 94 %  O2 Device Room Air  During Hemodialysis Assessment  Blood Flow Rate (mL/min) 400 mL/min  Arterial Pressure (mmHg) -150 mmHg  Venous Pressure (mmHg) 110 mmHg  Transmembrane Pressure (mmHg) 60 mmHg  Ultrafiltration Rate (mL/min) 650 mL/min  Dialysate Flow Rate (mL/min) 600 ml/min  Conductivity: Machine  14.2  HD Safety Checks Performed Yes  Dialysis Fluid Bolus Normal Saline  Bolus Amount (mL) 250 mL  Intra-Hemodialysis Comments Tx initiated  Hemodialysis Catheter Left Subclavian Double-lumen  Placement Date: 02/11/18   Placed prior to admission: No  Orientation: Left  Access Location: Subclavian  Hemodialysis Catheter Type: Double-lumen  Blue Lumen Status Infusing  Red Lumen Status Infusing

## 2018-03-10 NOTE — Progress Notes (Signed)
Pre HD assessment    03/10/18 0901  Neurological  Level of Consciousness Alert  Orientation Level Oriented X4  Respiratory  Respiratory Pattern Regular;Unlabored  Chest Assessment Chest expansion symmetrical  Cardiac  ECG Monitor Yes  Cardiac Rhythm NSR  Vascular  R Radial Pulse +2  L Radial Pulse +2  Edema Generalized  Integumentary  Integumentary (WDL) X  Skin Color Appropriate for ethnicity  Musculoskeletal  Musculoskeletal (WDL) X  Generalized Weakness Yes  Assistive Device None  GU Assessment  Genitourinary (WDL) X  Genitourinary Symptoms  (HD)  Psychosocial  Psychosocial (WDL) WDL  Needs Expressed Denies  Emotional support given Given to patient

## 2018-03-10 NOTE — Progress Notes (Signed)
Pre HD assessment   03/10/18 0900  Vital Signs  Temp 98.7 F (37.1 C)  Temp Source Oral  Pulse Rate 60  Pulse Rate Source Monitor  Resp 20  BP (!) 170/59  BP Location Right Arm  BP Method Automatic  Patient Position (if appropriate) Lying  Oxygen Therapy  SpO2 97 %  O2 Device Room Air  Pain Assessment  Pain Scale 0-10  Pain Score 0  Dialysis Weight  Weight 60.3 kg  Type of Weight Pre-Dialysis  Time-Out for Hemodialysis  What Procedure? HD  Pt Identifiers(min of two) First/Last Name;MRN/Account#  Correct Site? Yes  Correct Side? Yes  Correct Procedure? Yes  Consents Verified? Yes  Rad Studies Available? N/A  Safety Precautions Reviewed? Yes  Research scientist (physical sciences)  (2A)  Station Number 1  UF/Alarm Test Passed  Conductivity: Meter 14  Conductivity: Machine  14.2  pH 7.6  Reverse Osmosis main  Normal Saline Lot Number [id number]  Dialyzer Lot Number [id number]  Disposable Set Lot Number [id number]  Machine Temperature 98.6 F (37 C)  Musician and Audible Yes  Blood Lines Intact and Secured Yes  Pre Treatment Patient Checks  Vascular access used during treatment Catheter  Hepatitis B Surface Antigen Results Negative  Date Hepatitis B Surface Antigen Drawn 11/26/17  Hepatitis B Surface Antibody 147  Date Hepatitis B Surface Antibody Drawn 11/26/17  Hemodialysis Consent Verified Yes  Hemodialysis Standing Orders Initiated Yes  ECG (Telemetry) Monitor On Yes  Prime Ordered Normal Saline  Length of  DialysisTreatment -hour(s) 3 Hour(s)  Dialyzer Elisio 17H NR  Dialysate 3K, 2.5 Ca  Dialysis Anticoagulant None  Dialysate Flow Ordered 600  Blood Flow Rate Ordered 400 mL/min  Ultrafiltration Goal 1.5 Liters  Pre Treatment Labs CBC;Renal panel;Other (Comment) (troponin)  Dialysis Blood Pressure Support Ordered Normal Saline  Education / Care Plan  Dialysis Education Provided Yes  Documented Education in Care Plan Yes  Hemodialysis Catheter Left  Subclavian Double-lumen  Placement Date: 02/11/18   Placed prior to admission: No  Orientation: Left  Access Location: Subclavian  Hemodialysis Catheter Type: Double-lumen  Site Condition No complications  Blue Lumen Status Heparin locked  Red Lumen Status Heparin locked  Purple Lumen Status N/A  Dressing Type Gauze/Drain sponge  Dressing Status Clean;Dry;Intact  Drainage Description None

## 2018-03-10 NOTE — Progress Notes (Signed)
Post HD assessment    03/10/18 1221  Neurological  Level of Consciousness Alert  Orientation Level Oriented X4  Respiratory  Respiratory Pattern Regular;Unlabored  Chest Assessment Chest expansion symmetrical  Cardiac  ECG Monitor Yes  Cardiac Rhythm SB;NSR  Vascular  R Radial Pulse +2  L Radial Pulse +2  Edema Generalized  Integumentary  Integumentary (WDL) X  Skin Color Appropriate for ethnicity  Musculoskeletal  Musculoskeletal (WDL) X  Generalized Weakness Yes  Assistive Device None  GU Assessment  Genitourinary (WDL) X  Genitourinary Symptoms  (HD)  Psychosocial  Psychosocial (WDL) WDL

## 2018-03-10 NOTE — Progress Notes (Signed)
HD tx end    03/10/18 1214  Vital Signs  Pulse Rate 66  Pulse Rate Source Monitor  Resp 17  BP (!) 165/85  BP Location Right Arm  BP Method Automatic  Patient Position (if appropriate) Lying  Oxygen Therapy  SpO2 100 %  O2 Device Room Air  During Hemodialysis Assessment  Dialysis Fluid Bolus Normal Saline  Bolus Amount (mL) 250 mL  Intra-Hemodialysis Comments Tx completed

## 2018-03-10 NOTE — Clinical Social Work Note (Signed)
Clinical Social Work Assessment  Patient Details  Name: Stephanie Ellis MRN: [id number] Date of Birth: 1950/09/26  Date of referral:  03/10/18               Reason for consult:  Discharge Planning                Permission sought to share information with:  Facility Sport and exercise psychologist, Family Supports Permission granted to share information::  Yes, Verbal Permission Granted  Name::        Agency::     Relationship::     Contact Information:     Housing/Transportation Living arrangements for the past 2 months:  [place] of Information:  Patient Patient Interpreter Needed:  None Criminal Activity/Legal Involvement Pertinent to Current Situation/Hospitalization:  No - Comment as needed Significant Relationships:  Adult Children Lives with:  Facility Resident Do you feel safe going back to the place where you live?  Yes Need for family participation in patient care:  Yes (Comment)  Care giving concerns:  Patient has been at [facility] for short term rehab.    Social Worker assessment / plan:  CSW spoke with patient today at bedside. Patient was resting and so CSW made assessment brief. Patient confirms that she is still wanting to return to [facility] when time. Patient's daughter: [name] is very involved with patient and her care.  Employment status:    Insurance information:  Medicare PT Recommendations:  [place] / Referral to community resources:     Patient/Family's Response to care:  Patient expressed appreciation for CSW visit.  Patient/Family's Understanding of and Emotional Response to Diagnosis, Current Treatment, and Prognosis:  Patient is hoping to feel better soon.  Emotional Assessment Appearance:  Appears stated age Attitude/Demeanor/Rapport:  (tired but pleasant) Affect (typically observed):  Calm Orientation:  Oriented to Self, Oriented to Place, Oriented to  Time, Oriented to Situation Alcohol /  Substance use:  Not Applicable Psych involvement (Current and /or in the community):  No (Comment)  Discharge Needs  Concerns to be addressed:  Care Coordination Readmission within the last 30 days:  No Current discharge risk:  None Barriers to Discharge:  No Barriers Identified   Shela Leff, LCSW 03/10/2018, 2:38 PM

## 2018-03-10 NOTE — Progress Notes (Signed)
[facility] [place] NAME: Stephanie Ellis    MR#:  [id number]  DATE OF BIRTH:  09-12-1950  SUBJECTIVE:  Patient here with elevated BP and SOB  Patient had hemodialysis today and yesterday, agreeable with the left BKA tomorrow.  Reporting left eye infection with discharge  REVIEW OF SYSTEMS:    Review of Systems  Constitutional: Negative for fever, chills weight loss HENT: Negative for ear pain, nosebleeds, facial swelling, rhinorrhea, neck pain, neck stiffness and ear discharge.   Nasal congestion Respiratory: Negative for cough, ++shortness of breath, wheezing  Cardiovascular: Negative for chest pain, palpitations and leg swelling.  Gastrointestinal: Negative for heartburn, abdominal pain, vomiting, diarrhea or consitpation Genitourinary: Negative for dysuria, urgency, frequency, hematuria Musculoskeletal: Negative for back pain or joint pain Neurological: Negative for dizziness, seizures, syncope, focal weakness,  numbness and headaches.  Hematological: Does not bruise/bleed easily.  Psychiatric/Behavioral: Negative for hallucinations, confusion, dysphoric mood    Tolerating Diet: yes      DRUG ALLERGIES:   Allergies  Allergen Reactions  . Chlorthalidone Anaphylaxis, Itching and Rash  . Fentanyl Rash  . Midazolam Rash  . Ace Inhibitors Other (See Comments)    Reaction:  Hyperkalemia, agitation   . Angiotensin Receptor Blockers Other (See Comments)    Reaction:  Hyperkalemia, agitation   . Norvasc [Amlodipine] Itching and Rash  . Phenergan [Promethazine Hcl] Anxiety    "antsy, can't sit still"    VITALS:  Blood pressure (!) 172/70, pulse 65, temperature 98.7 F (37.1 C), temperature source Oral, resp. rate 15, height 5' 4"  (1.626 m), weight 58.9 kg, SpO2 100 %.  PHYSICAL EXAMINATION:  Constitutional: Appears well-developed and well-nourished. No distress. HENT: Normocephalic. Marland Kitchen Oropharynx is clear and moist.  Eyes:  Conjunctivae and EOM are normal. PERRLA, no scleral icterus.  Neck: Normal ROM. Neck supple. No JVD. No tracheal deviation. CVS: RRR, S1/S2 +, no murmurs, no gallops, no carotid bruit.  Pulmonary: Effort and breath sounds normal, no stridor, rhonchi, wheezes, rales.  Abdominal: Soft. BS +,  no distension, tenderness, rebound or guarding.  Musculoskeletal: Status post right BKA, left foot status post first toe amputated, second and third toes are gangrenous.    Neuro: Alert. CN 2-12 grossly intact. No focal deficits. Skin: Skin is warm and dry. No rash noted. Psychiatric: Normal mood and affect.      LABORATORY PANEL:   CBC Recent Labs  Lab 03/10/18 0931  WBC 9.4  HGB 8.3*  HCT 28.0*  PLT 353   ------------------------------------------------------------------------------------------------------------------  Chemistries  Recent Labs  Lab 03/10/18 0931  NA 141  K 3.9  CL 100  CO2 31  GLUCOSE 131*  BUN 23  CREATININE 5.07*  CALCIUM 8.5*   ------------------------------------------------------------------------------------------------------------------  Cardiac Enzymes Recent Labs  Lab 03/08/18 1349 03/08/18 1630 03/10/18 0931  TROPONINI 0.05* 0.05* 0.04*   ------------------------------------------------------------------------------------------------------------------  RADIOLOGY:  Dg Chest 2 View  Result Date: 03/08/2018 CLINICAL DATA:  Chest pain and pressure. EXAM: CHEST - 2 VIEW COMPARISON:  02/11/2018 FINDINGS: The cardio pericardial silhouette is enlarged. There is pulmonary vascular congestion without overt pulmonary edema. Atelectasis or infiltrate noted in the lung bases bilaterally. Bilateral pleural effusions evident, left greater than right. Left dialysis catheter tip overlies the right atrium. The visualized bony structures of the thorax are intact. IMPRESSION: Cardiomegaly with vascular congestion and bibasilar atelectasis or infiltrate. Small  bilateral pleural effusions. Electronically Signed   By: [name] Stanley M.D.   On: 03/08/2018 14:56   Ct  Orbits Wo Contrast  Result Date: 03/08/2018 CLINICAL DATA:  67 year old female with left eye drainage and pain for 1 week. EXAM: CT ORBITS WITHOUT CONTRAST TECHNIQUE: Multidetector CT images were obtained using the standard protocol without intravenous contrast. COMPARISON:  MRI brain 11/15/2017, head CTs 10/15/2017. FINDINGS: Orbits: Chronic postoperative changes to both globes, appear stable since the June CT. Globes remain intact. No definite asymmetric periorbital or preseptal soft tissue thickening or inflammation. No postseptal inflammation. Pronounced chronic calcified atherosclerosis of the ophthalmic arteries stable since June (series 3, image 35). Bilateral orbital walls are intact. Other osseous structures: No acute osseous abnormality identified. 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 Chronic mild bubbly opacity in the right sphenoid. The anterior ethmoids, frontal and maxillary sinuses remain clear. Tympanic cavities remain clear. New mild left mastoid effusion. Right mastoids remain clear. Soft tissues: Pronounced bilateral scalp calcified atherosclerosis. Noncontrast visible pharynx and parapharyngeal spaces are within normal limits. Limited intracranial: Bilateral carotid and vertebral artery calcified atherosclerosis. Stable visible noncontrast brain parenchyma. IMPRESSION: 1. No acute orbital findings evident on noncontrast CT. Stable postoperative changes to both globes. 2. Advanced calcified atherosclerosis, including chronic involvement of the bilateral ophthalmic arteries and scalp vessels, suggesting chronic renal failure. 3. 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 There is a new mild left mastoid effusion, nonspecific but typically postinflammatory. Electronically Signed   By: Genevie Ann M.D.   On: 03/08/2018 16:14     ASSESSMENT AND PLAN:   67 y.o. 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  1.    Acute hypoxic respiratory failure due to pulmonary edema from end-stage renal disease needing dialysis:  Clinically better.  Status post hemodialysis yesterday and today oxygen to be weaned off   2.  Hypertensive urgency: Blood pressure is elevated still  continue home medications including isosorbide and Coreg dose increased to 6.25.  Hydralazine 25 mg is added to the regimen will titrate as needed.  3.  Diabetes mellitus: Continue ADA diet with Lantus and sliding scale  4.  Left eye blurred vision-reporting purulent discharge today  appreciate ophthalmology input in the ED.  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  She had prior laser capsulotomy.  Eyedrops recommended at this time.  She will need pars plano vitrectomy-recommended outpatient follow-up after discharge. Add erythromycin ointment to the eye  5.  Peripheral arterial disease: Left foot second and third toes are gangrenous now. Vascular surgery is recommending left BKA as left lower extremity is not salvageable as per podiatry. N.p.o. after midnight for left leg BKA  6.  End-stage renal disease on hemodialysis: Had hemodialysis yesterday and today She will continue with her normal dialysis schedule Tuesday, Thursday and Saturday   7.    History of tricuspid endocarditis-with MSSA.  Currently on cefazolin with dialysis.  Continue until 03/13/2018.   D/w dr Abigail Butts   Management plans discussed with the  patient and she is in agreement.  CODE STATUS: full  TOTAL TIME TAKING CARE OF THIS PATIENT: 35 minutes.     POSSIBLE D/C 3-5 days, DEPENDING ON CLINICAL CONDITION.   Illene Silver  Leilany Digeronimo M.D on 03/10/2018 at 1:48 PM  Between 7am to 6pm - Pager - [phone] After 6pm go to [email or web address] - password [email or web address] [facility]  Office  [phone]  CC: Primary care physician; [facility] [employer] [facility]  Note: This dictation was prepared with Dragon dictation along with smaller phrase technology. Any transcriptional errors that result from this process are unintentional.

## 2018-03-10 NOTE — Progress Notes (Signed)
Post HD assessment. Pt tolerated  tx well without c/o or complication. Net UF 1518, goal met.    03/10/18 1222  Vital Signs  Temp 98.7 F (37.1 C)  Temp Source Oral  Pulse Rate 66  Pulse Rate Source Monitor  Resp 12  BP (!) 184/63  BP Location Right Arm  BP Method Automatic  Patient Position (if appropriate) Lying  Oxygen Therapy  SpO2 100 %  O2 Device Room Air  Dialysis Weight  Weight 58.9 kg  Type of Weight Post-Dialysis  Post-Hemodialysis Assessment  Rinseback Volume (mL) 250 mL  KECN 68.3 V  Dialyzer Clearance Lightly streaked  Duration of HD Treatment -hour(s) 3 hour(s)  Hemodialysis Intake (mL) 500 mL  UF Total -Machine (mL) 2018 mL  Net UF (mL) 1518 mL  Tolerated HD Treatment Yes  Education / Care Plan  Dialysis Education Provided Yes  Documented Education in Care Plan Yes  Hemodialysis Catheter Left Subclavian Double-lumen  Placement Date: 02/11/18   Placed prior to admission: No  Orientation: Left  Access Location: Subclavian  Hemodialysis Catheter Type: Double-lumen  Site Condition No complications  Blue Lumen Status Heparin locked  Red Lumen Status Heparin locked  Purple Lumen Status N/A  Catheter fill solution Heparin 1000 units/ml  Catheter fill volume (Arterial) 1.7 cc  Catheter fill volume (Venous) 1.7  Dressing Type Biopatch  Dressing Status Dressing changed  Interventions New dressing  Drainage Description None  Dressing Change Due 03/17/18  Post treatment catheter status Capped and Clamped

## 2018-03-11 ENCOUNTER — Encounter
Admission: EM | Disposition: A | Discharge status: Skilled Nursing Facility | Payer: Self-pay | Source: Home / Self Care | Attending: Internal Medicine

## 2018-03-11 ENCOUNTER — Inpatient Hospital Stay: Payer: Medicare Other | Admitting: Anesthesiology

## 2018-03-11 ENCOUNTER — Encounter: Payer: Self-pay | Admitting: Anesthesiology

## 2018-03-11 DIAGNOSIS — I96 Gangrene, not elsewhere classified: Secondary | ICD-10-CM

## 2018-03-11 HISTORY — PX: AMPUTATION: SHX166

## 2018-03-11 LAB — CBC
HCT: 29.4 % — ABNORMAL LOW (ref 36.0–46.0)
Hemoglobin: 8.6 g/dL — ABNORMAL LOW (ref 12.0–15.0)
MCH: 30.2 pg (ref 26.0–34.0)
MCHC: 29.3 g/dL — ABNORMAL LOW (ref 30.0–36.0)
MCV: 103.2 fL — ABNORMAL HIGH (ref 80.0–100.0)
Platelets: 360 10*3/uL (ref 150–400)
RBC: 2.85 MIL/uL — ABNORMAL LOW (ref 3.87–5.11)
RDW: 19.9 % — ABNORMAL HIGH (ref 11.5–15.5)
WBC: 10.4 10*3/uL (ref 4.0–10.5)
nRBC: 0 % (ref 0.0–0.2)

## 2018-03-11 LAB — MAGNESIUM: Magnesium: 1.9 mg/dL (ref 1.7–2.4)

## 2018-03-11 LAB — APTT: aPTT: 41 seconds — ABNORMAL HIGH (ref 24–36)

## 2018-03-11 LAB — GLUCOSE, CAPILLARY
Glucose-Capillary: 102 mg/dL — ABNORMAL HIGH (ref 70–99)
Glucose-Capillary: 120 mg/dL — ABNORMAL HIGH (ref 70–99)
Glucose-Capillary: 101 mg/dL — ABNORMAL HIGH (ref 70–99)
Glucose-Capillary: 110 mg/dL — ABNORMAL HIGH (ref 70–99)
Glucose-Capillary: 127 mg/dL — ABNORMAL HIGH (ref 70–99)

## 2018-03-11 LAB — TYPE AND SCREEN
ABO/RH(D): B POS
Antibody Screen: NEGATIVE

## 2018-03-11 LAB — BASIC METABOLIC PANEL
Anion gap: 11 (ref 5–15)
BUN: 21 mg/dL (ref 8–23)
CO2: 32 mmol/L (ref 22–32)
Calcium: 8.6 mg/dL — ABNORMAL LOW (ref 8.9–10.3)
Chloride: 99 mmol/L (ref 98–111)
Creatinine, Ser: 3.86 mg/dL — ABNORMAL HIGH (ref 0.44–1.00)
GFR calc Af Amer: 13 mL/min — ABNORMAL LOW (ref >60)
GFR calc non Af Amer: 11 mL/min — ABNORMAL LOW (ref >60)
Glucose, Bld: 129 mg/dL — ABNORMAL HIGH (ref 70–99)
Potassium: 3.8 mmol/L (ref 3.5–5.1)
Sodium: 142 mmol/L (ref 135–145)

## 2018-03-11 LAB — PROTIME-INR
INR: 1.25
Prothrombin Time: 15.6 seconds — ABNORMAL HIGH (ref 11.4–15.2)

## 2018-03-11 LAB — PREPARE RBC (CROSSMATCH)

## 2018-03-11 SURGERY — AMPUTATION BELOW KNEE
Anesthesia: General | Site: Leg Lower | Laterality: Left

## 2018-03-11 MED ORDER — LIDOCAINE HCL (PF) 2 % IJ SOLN
INTRAMUSCULAR | Status: AC
  Filled 2018-03-11: qty 10

## 2018-03-11 MED ORDER — ONDANSETRON HCL 4 MG/2ML IJ SOLN
INTRAMUSCULAR | Status: AC
  Filled 2018-03-11: qty 2

## 2018-03-11 MED ORDER — OXYCODONE HCL 5 MG PO TABS: 5.0000 mg | ORAL_TABLET | Freq: Once | ORAL | Status: DC | PRN

## 2018-03-11 MED ORDER — PROPOFOL 10 MG/ML IV BOLUS
INTRAVENOUS | Status: DC | PRN
  Administered 2018-03-11: 70 mg via INTRAVENOUS

## 2018-03-11 MED ORDER — SEVOFLURANE IN SOLN
RESPIRATORY_TRACT | Status: AC
  Filled 2018-03-11: qty 250

## 2018-03-11 MED ORDER — DEXMEDETOMIDINE HCL IN NACL 80 MCG/20ML IV SOLN
INTRAVENOUS | Status: AC
  Filled 2018-03-11: qty 20

## 2018-03-11 MED ORDER — DEXMEDETOMIDINE HCL 200 MCG/2ML IV SOLN
INTRAVENOUS | Status: DC | PRN
  Administered 2018-03-11 (×2): 8 ug via INTRAVENOUS

## 2018-03-11 MED ORDER — EPHEDRINE SULFATE 50 MG/ML IJ SOLN
INTRAMUSCULAR | Status: DC | PRN
  Administered 2018-03-11 (×3): 5 mg via INTRAVENOUS

## 2018-03-11 MED ORDER — PROPOFOL 10 MG/ML IV BOLUS
INTRAVENOUS | Status: AC
  Filled 2018-03-11: qty 20

## 2018-03-11 MED ORDER — SODIUM CHLORIDE 0.9 % IV SOLN
INTRAVENOUS | Status: DC | PRN
  Administered 2018-03-11: 12:00:00 via INTRAVENOUS

## 2018-03-11 MED ORDER — KETAMINE HCL 50 MG/ML IJ SOLN
INTRAMUSCULAR | Status: DC | PRN
  Administered 2018-03-11: 15 mg via INTRAMUSCULAR
  Administered 2018-03-11: 20 mg via INTRAMUSCULAR
  Administered 2018-03-11: 15 mg via INTRAMUSCULAR

## 2018-03-11 MED ORDER — EPHEDRINE SULFATE 50 MG/ML IJ SOLN
INTRAMUSCULAR | Status: AC
  Filled 2018-03-11: qty 1

## 2018-03-11 MED ORDER — PHENYLEPHRINE HCL 10 MG/ML IJ SOLN
INTRAMUSCULAR | Status: AC
  Filled 2018-03-11: qty 1

## 2018-03-11 MED ORDER — HYDROMORPHONE HCL 1 MG/ML IJ SOLN
INTRAMUSCULAR | Status: AC
  Administered 2018-03-11: 0.5 mg via INTRAVENOUS
  Filled 2018-03-11: qty 1

## 2018-03-11 MED ORDER — GLYCOPYRROLATE 0.2 MG/ML IJ SOLN
INTRAMUSCULAR | Status: AC
  Filled 2018-03-11: qty 1

## 2018-03-11 MED ORDER — OXYCODONE HCL 5 MG/5ML PO SOLN: 5.0000 mg | Freq: Once | ORAL | Status: DC | PRN

## 2018-03-11 MED ORDER — ONDANSETRON HCL 4 MG/2ML IJ SOLN
INTRAMUSCULAR | Status: DC | PRN
  Administered 2018-03-11: 4 mg via INTRAVENOUS

## 2018-03-11 MED ORDER — KETAMINE HCL 50 MG/ML IJ SOLN
INTRAMUSCULAR | Status: AC
  Filled 2018-03-11: qty 10

## 2018-03-11 MED ORDER — LIDOCAINE HCL (CARDIAC) PF 100 MG/5ML IV SOSY
PREFILLED_SYRINGE | INTRAVENOUS | Status: DC | PRN
  Administered 2018-03-11: 100 mg via INTRAVENOUS

## 2018-03-11 MED ORDER — ONDANSETRON HCL 4 MG/2ML IJ SOLN: 4.0000 mg | Freq: Once | INTRAMUSCULAR | Status: DC | PRN

## 2018-03-11 MED ORDER — PHENYLEPHRINE HCL 10 MG/ML IJ SOLN
INTRAMUSCULAR | Status: DC | PRN
  Administered 2018-03-11: 50 ug via INTRAVENOUS

## 2018-03-11 MED ORDER — GLYCOPYRROLATE 0.2 MG/ML IJ SOLN
INTRAMUSCULAR | Status: DC | PRN
  Administered 2018-03-11: 0.1 mg via INTRAVENOUS

## 2018-03-11 MED ORDER — HYDROMORPHONE HCL 1 MG/ML IJ SOLN
0.5000 mg | INTRAMUSCULAR | Status: DC | PRN
  Administered 2018-03-11: 0.5 mg via INTRAVENOUS

## 2018-03-11 SURGICAL SUPPLY — 42 items
BANDAGE ACE 6X5 VEL STRL LF (GAUZE/BANDAGES/DRESSINGS) ×1 IMPLANT
BANDAGE ELASTIC 6 LF NS (GAUZE/BANDAGES/DRESSINGS) ×2 IMPLANT
BLADE SAGITTAL WIDE XTHICK NO (BLADE) ×2 IMPLANT
BLADE SAW SAG 25.4X90 (BLADE) ×1 IMPLANT
BNDG COHESIVE 4X5 TAN STRL (GAUZE/BANDAGES/DRESSINGS) ×2 IMPLANT
BNDG GAUZE 4.5X4.1 6PLY STRL (MISCELLANEOUS) ×4 IMPLANT
BNDG STRETCH 4X75 STRL LF (GAUZE/BANDAGES/DRESSINGS) ×1 IMPLANT
BRUSH SCRUB EZ  4% CHG (MISCELLANEOUS) ×1
BRUSH SCRUB EZ 4% CHG (MISCELLANEOUS) ×1 IMPLANT
CANISTER SUCT 1200ML W/VALVE (MISCELLANEOUS) ×2 IMPLANT
COVER WAND RF STERILE (DRAPES) ×2 IMPLANT
DRAIN PENROSE 1/4X12 LTX (DRAIN) ×2 IMPLANT
DRAPE STERI IOBAN 125X83 (DRAPES) IMPLANT
DURAPREP 26ML APPLICATOR (WOUND CARE) ×2 IMPLANT
ELECT CAUTERY BLADE 6.4 (BLADE) ×2 IMPLANT
ELECT REM PT RETURN 9FT ADLT (ELECTROSURGICAL) ×2
ELECTRODE REM PT RTRN 9FT ADLT (ELECTROSURGICAL) ×1 IMPLANT
GAUZE PETRO XEROFOAM 1X8 (MISCELLANEOUS) ×4 IMPLANT
GAUZE XEROFORM 4X4 STRL (GAUZE/BANDAGES/DRESSINGS) ×1 IMPLANT
GLOVE BIO SURGEON STRL SZ7 (GLOVE) ×4 IMPLANT
GLOVE INDICATOR 7.5 STRL GRN (GLOVE) ×2 IMPLANT
GOWN STRL REUS W/ TWL LRG LVL3 (GOWN DISPOSABLE) ×2 IMPLANT
GOWN STRL REUS W/ TWL XL LVL3 (GOWN DISPOSABLE) ×1 IMPLANT
GOWN STRL REUS W/TWL LRG LVL3 (GOWN DISPOSABLE) ×2
GOWN STRL REUS W/TWL XL LVL3 (GOWN DISPOSABLE) ×1
HANDLE YANKAUER SUCT BULB TIP (MISCELLANEOUS) ×2 IMPLANT
KIT TURNOVER KIT A (KITS) ×2 IMPLANT
LABEL OR SOLS (LABEL) ×2 IMPLANT
NS IRRIG 1000ML POUR BTL (IV SOLUTION) ×2 IMPLANT
PACK EXTREMITY ARMC (MISCELLANEOUS) ×2 IMPLANT
PAD ABD DERMACEA PRESS 5X9 (GAUZE/BANDAGES/DRESSINGS) ×3 IMPLANT
PAD PREP 24X41 OB/GYN DISP (PERSONAL CARE ITEMS) ×2 IMPLANT
SPONGE LAP 18X18 RF (DISPOSABLE) ×3 IMPLANT
STAPLER SKIN PROX 35W (STAPLE) ×2 IMPLANT
STOCKINETTE M/LG 89821 (MISCELLANEOUS) ×2 IMPLANT
SUT SILK 2 0 (SUTURE) ×1
SUT SILK 2 0 SH (SUTURE) ×4 IMPLANT
SUT SILK 2-0 18XBRD TIE 12 (SUTURE) ×1 IMPLANT
SUT SILK 3 0 (SUTURE) ×1
SUT SILK 3-0 18XBRD TIE 12 (SUTURE) ×1 IMPLANT
SUT VIC AB 0 CT1 36 (SUTURE) ×5 IMPLANT
SUT VIC AB 2-0 CT1 (SUTURE) ×4 IMPLANT

## 2018-03-11 NOTE — Progress Notes (Signed)
Family Meeting Note  Advance Directive:yes  Today a meeting took place with the Patient, 2 sisters and daughters at bedside     The following clinical team members were present during this meeting:MD  The following were discussed:Patient's diagnosis: Left foot gangrene with history of peripheral arterial disease nonsalvageable, requiring left BKA, hypertensive urgency, left eye blurry vision with purulent discharge, pulmonary edema with history of end-stage renal disease on hemodialysis, diabetes mellitus, history of tricuspid endocarditis plan is to continue cefazolin during dialysis until 03/13/2018 treatment plan of care discussed in detail with the patient and family members at bedside.  They verbalized understanding of the plan.    Patient's progosis: Unable to determine and Goals for treatment: Full Code.  Daughter Stephanie Ellis is the healthcare POA  Additional follow-up to be provided: Hospitalist, nephrology, vascular surgery, podiatry  Time spent during discussion:55mn    ANicholes Mango MD

## 2018-03-11 NOTE — Anesthesia Preprocedure Evaluation (Signed)
Anesthesia Evaluation  Patient identified by MRN, date of birth, ID band Patient awake    Reviewed: Allergy & Precautions, NPO status , Patient's Chart, lab work & pertinent test results, reviewed documented beta blocker date and time   History of Anesthesia Complications (+) history of anesthetic complications  Airway Mallampati: II  TM Distance: >3 FB     Dental  (+) Poor Dentition   Pulmonary neg pulmonary ROS, neg sleep apnea, neg COPD,    breath sounds clear to auscultation- rhonchi (-) wheezing      Cardiovascular hypertension, Pt. on medications and Pt. on home beta blockers + angina + CAD, + Past MI, + Cardiac Stents (all prior to CABG), + CABG (2016), + Peripheral Vascular Disease and +CHF  + dysrhythmias + Valvular Problems/Murmurs  Rhythm:Regular Rate:Normal - Systolic murmurs and - Diastolic murmurs    Neuro/Psych negative neurological ROS  negative psych ROS   GI/Hepatic Neg liver ROS, GERD  Controlled,  Endo/Other  diabetes, Type 2, Insulin Dependent  Renal/GU ESRF and DialysisRenal disease     Musculoskeletal negative musculoskeletal ROS (+)   Abdominal (+) - obese,   Peds negative pediatric ROS (+)  Hematology  (+) anemia ,   Anesthesia Other Findings Past Medical History: No date: (HFpEF) heart failure with preserved ejection fraction ([place])     Comment:  a. 01/2018 Echo: EF 50-55%, no rwma, Gr1 DD, triv AI,               mild MR. Midly dil LA/RV, mod reduced RV fxn. Irreg               thickening of TV w/ mobile echodensity that appears to               arise from valve. No date: Anemia No date: Anorexia No date: Bacteremia due to Pseudomonas No date: Carotid arterial disease ([place])     Comment:  a. 01/2017 Carotid U/S: RICA <46, LICA <96. No date: Complication of anesthesia     Comment:  a. Pt reports h/o complication on 9 different occasions               - ? hypotension/arrest. 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 01/2015 Cath [facility]): 70% LM, p-mLAD 50-60% (Resting               FFR 0.75), mRCA 80-90%, ~40 Ost OM & D1-->CABG; b.               01/2016 Staged PCI of LCX x 2 and RCA. No date: ESRD (end stage renal disease) on dialysis [facility])     Comment:  a. ESRD secondary to acute kidney failure s/p CABG-->PD No date: Essential hypertension No date: GERD (gastroesophageal reflux disease) No date: Heart murmur No date: Hypercholesterolemia 2016: Myocardial infarction ([place]) No date: PVD (peripheral vascular disease) ([place])     Comment:  a. 01/30/2018 PV Angio: Sev Left Popliteal dzs s/p PTA               w/ 51m lutonix DEB w/ mech aspiration of the L popliteal,              L AT, and Left tibioperoneal trunck and peroneal artery. 03/24/2015: S/P CABG x 3     Comment:  a.  [facility]: Dr. BMarland KitchenHaithcock: CABG x 3, LIMA to LAD,              SVG  to RCA, SVG to OM3, [facility] 2019: Sinusitis No date: Type II diabetes mellitus with complication (HCC)     Comment:  CAD  Reproductive/Obstetrics                             Anesthesia Physical Anesthesia Plan  ASA: IV  Anesthesia Plan: General   Post-op Pain Management:    Induction: Intravenous  PONV Risk Score and Plan: 2 and Ondansetron and Dexamethasone  Airway Management Planned: LMA  Additional Equipment:   Intra-op Plan:   Post-operative Plan:   Informed Consent: I have reviewed the patients History and Physical, chart, labs and discussed the procedure including the risks, benefits and alternatives for the proposed anesthesia with the patient or authorized representative who has indicated his/her understanding and acceptance.   Dental advisory given  Plan Discussed with: CRNA and Anesthesiologist  Anesthesia Plan Comments:         Anesthesia Quick Evaluation

## 2018-03-11 NOTE — Care Management Important Message (Signed)
Copy of signed IM left in patient's room (out for dialysis).

## 2018-03-11 NOTE — Transfer of Care (Signed)
Immediate Anesthesia Transfer of Care Note  Patient: Stephanie Ellis  Procedure(s) Performed: AMPUTATION BELOW KNEE (Left Leg Lower)  Patient Location: PACU  Anesthesia Type:General  Level of Consciousness: drowsy  Airway & Oxygen Therapy: Patient Spontanous Breathing and Patient connected to face mask oxygen  Post-op Assessment: Report given to RN and Post -op Vital signs reviewed and stable  Post vital signs: Reviewed and stable  Last Vitals:  Vitals Value Taken Time  BP 171/67 03/11/2018  1:15 PM  Temp    Pulse 64 03/11/2018  1:17 PM  Resp 14 03/11/2018  1:17 PM  SpO2 99 % 03/11/2018  1:17 PM  Vitals shown include unvalidated device data.  Last Pain:  Vitals:   03/11/18 1058  TempSrc: Tympanic  PainSc: 5       Patients Stated Pain Goal: 0 (80/01/23 9359)  Complications: No apparent anesthesia complications

## 2018-03-11 NOTE — Progress Notes (Signed)
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  Reporting left eye infection with discharge is improving.  Family members at bedside  REVIEW OF SYSTEMS:    Review of Systems  Constitutional: Negative for fever, chills weight loss HENT: Negative for ear pain, nosebleeds, facial swelling, rhinorrhea, neck pain, neck stiffness and ear discharge.   Nasal congestion Respiratory: Negative for cough, shortness of breath, wheezing  Cardiovascular: Negative for chest pain, palpitations and leg swelling.  Gastrointestinal: Negative for heartburn, abdominal pain, vomiting, diarrhea or consitpation Genitourinary: Negative for dysuria, urgency, frequency, hematuria Musculoskeletal: Negative for back pain or joint pain Neurological: Negative for dizziness, seizures, syncope, focal weakness,  numbness and headaches.  Hematological: Does not bruise/bleed easily.  Psychiatric/Behavioral: Negative for hallucinations, confusion, dysphoric mood    Tolerating Diet: yes      DRUG ALLERGIES:   Allergies  Allergen Reactions  . Chlorthalidone Anaphylaxis, Itching and Rash  . Fentanyl Rash  . Midazolam Rash  . Ace Inhibitors Other (See Comments)    Reaction:  Hyperkalemia, agitation   . Angiotensin Receptor Blockers Other (See Comments)    Reaction:  Hyperkalemia, agitation   . Norvasc [Amlodipine] Itching and Rash  . Phenergan [Promethazine Hcl] Anxiety    "antsy, can't sit still"    VITALS:  Blood pressure (!) 161/56, pulse 64, temperature 97.6 F (36.4 C), resp. rate 16, height 5' 4"  (1.626 m), weight 58.9 kg, SpO2 96 %.  PHYSICAL EXAMINATION:  Constitutional: Appears well-developed and well-nourished. No distress. HENT: Normocephalic. Marland Kitchen Oropharynx is clear and moist.  Eyes: Conjunctivae and  EOM are normal. PERRLA, no scleral icterus.  Neck: Normal ROM. Neck supple. No JVD. No tracheal deviation. CVS: RRR, S1/S2 +, no murmurs, no gallops, no carotid bruit.  Pulmonary: Effort and breath sounds normal, no stridor, rhonchi, wheezes, rales.  Abdominal: Soft. BS +,  no distension, tenderness, rebound or guarding.  Musculoskeletal: Status post right BKA, left foot status post first toe amputated, second and third toes are gangrenous.    Neuro: Alert. CN 2-12 grossly intact. No focal deficits. Skin: Skin is warm and dry. No rash noted. Psychiatric: Normal mood and affect.      LABORATORY PANEL:   CBC Recent Labs  Lab 03/11/18 0457  WBC 10.4  HGB 8.6*  HCT 29.4*  PLT 360   ------------------------------------------------------------------------------------------------------------------  Chemistries  Recent Labs  Lab 03/11/18 0457  NA 142  K 3.8  CL 99  CO2 32  GLUCOSE 129*  BUN 21  CREATININE 3.86*  CALCIUM 8.6*  MG 1.9   ------------------------------------------------------------------------------------------------------------------  Cardiac Enzymes Recent Labs  Lab 03/10/18 0931 03/10/18 1559 03/10/18 2221  TROPONINI 0.04* 0.04* 0.04*   ------------------------------------------------------------------------------------------------------------------  RADIOLOGY:  No results found.   ASSESSMENT AND PLAN:   67 y.o. 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  1.    Peripheral arterial disease: Left foot second and third toes with gangrene Nonsalvageable left lower extremity  per podiatry Vascular surgery scheduled  for left BKA today  N.p.o.   2.  Hypertensive urgency: Blood pressure is elevated still  continue home medications including isosorbide and Coreg dose increased to 6.25.  Hydralazine 25 mg is added to the regimen will titrate as needed.  3.  Diabetes mellitus: Continue ADA diet with Lantus and sliding scale  4.  Left eye blurred vision-reporting purulent discharge today  appreciate ophthalmology input in the ED.  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  She had prior laser capsulotomy.  Eyedrops recommended at this time.  She will need pars plano vitrectomy-recommended outpatient follow-up after discharge. Add erythromycin ointment to the eye  5.  Acute hypoxic respiratory failure due to pulmonary edema from end-stage renal disease needing dialysis:  Clinically better.  Status post hemodialysis for the past 2 days oxygen to be weaned off   6.  End-stage renal disease on hemodialysis: Had hemodialysis yesterday and today She will continue with her normal dialysis schedule Tuesday, Thursday and Saturday   7.    History of tricuspid endocarditis-with MSSA.  Currently on cefazolin with dialysis.  Continue until 03/13/2018.   D/w dr Abigail Butts, vascular surgery   Management plans discussed with the patient and she is in agreement.  CODE STATUS: full  TOTAL TIME TAKING CARE OF THIS PATIENT: 35 minutes.     POSSIBLE D/C 2-3 days, DEPENDING ON CLINICAL CONDITION.   [name] M.D on 03/11/2018 at 2:11 PM  Between 7am to 6pm - Pager - [phone] After 6pm go to [email or web address] - password [email or web address] [facility]  Office  [phone]  CC: Primary care physician; [facility]  Note: This dictation was prepared with Dragon dictation along with smaller phrase technology. Any transcriptional errors that result from this process are unintentional.

## 2018-03-11 NOTE — H&P (Signed)
[facility] History & Physical Update  The patient was interviewed and re-examined.  The patient's previous History and Physical has been reviewed and is unchanged.  There is no change in the plan of care. We plan to proceed with the scheduled procedure.  Leotis Pain, MD  03/11/2018, 11:38 AM

## 2018-03-11 NOTE — Anesthesia Post-op Follow-up Note (Signed)
Anesthesia QCDR form completed.        

## 2018-03-11 NOTE — Anesthesia Procedure Notes (Signed)
Procedure Name: LMA Insertion Performed by: Jacqualine Mau, RN Pre-anesthesia Checklist: Patient identified, Patient being monitored, Timeout performed, Emergency Drugs available and Suction available Patient Re-evaluated:Patient Re-evaluated prior to induction Oxygen Delivery Method: Circle system utilized Preoxygenation: Pre-oxygenation with 100% oxygen Induction Type: IV induction Ventilation: Mask ventilation without difficulty LMA: LMA inserted LMA Size: 3.5 Tube type: Oral Number of attempts: 1 Placement Confirmation: positive ETCO2 and breath sounds checked- equal and bilateral Tube secured with: Tape Dental Injury: Teeth and Oropharynx as per pre-operative assessment

## 2018-03-11 NOTE — Anesthesia Postprocedure Evaluation (Signed)
Anesthesia Post Note  Patient: Stephanie Ellis  Procedure(s) Performed: AMPUTATION BELOW KNEE (Left Leg Lower)  Patient location during evaluation: PACU Anesthesia Type: General Level of consciousness: awake and alert Pain management: pain level controlled Vital Signs Assessment: post-procedure vital signs reviewed and stable Respiratory status: spontaneous breathing, nonlabored ventilation, respiratory function stable and patient connected to nasal cannula oxygen Cardiovascular status: blood pressure returned to baseline and stable Postop Assessment: no apparent nausea or vomiting Anesthetic complications: no     Last Vitals:  Vitals:   03/11/18 1508 03/11/18 1654  BP: (!) 133/52 (!) 125/50  Pulse: 65 61  Resp:    Temp: 36.8 C   SpO2: 100% 100%    Last Pain:  Vitals:   03/11/18 1700  TempSrc:   PainSc: [place]

## 2018-03-11 NOTE — Op Note (Signed)
   OPERATIVE NOTE   PROCEDURE: Left below-the-knee amputation  PRE-OPERATIVE DIAGNOSIS: Left foot gangrene  POST-OPERATIVE DIAGNOSIS: same as above  SURGEON: Leotis Pain, MD  ASSISTANT(S): none  ANESTHESIA: general  ESTIMATED BLOOD LOSS: 50 cc  FINDING(S): none  SPECIMEN(S):  Left below-the-knee amputation  INDICATIONS:   Stephanie Ellis is a 67 y.o. female who presents with left leg gangrene.  The patient is scheduled for a left below-the-knee amputation.  I discussed in depth with the patient the risks, benefits, and alternatives to this procedure.  The patient is aware that the risk of this operation included but are not limited to:  bleeding, infection, myocardial infarction, stroke, death, failure to heal amputation wound, and possible need for more proximal amputation.  The patient is aware of the risks and agrees proceed forward with the procedure.  DESCRIPTION:  After full informed written consent was obtained from the patient, the patient was brought back to the operating room, and placed supine upon the operating table.  Prior to induction, the patient received IV antibiotics.  The patient was then prepped and draped in the standard fashion for a below-the-knee amputation.  After obtaining adequate anesthesia, the patient was prepped and draped in the standard fashion for a left below-the-knee amputation.  I marked out the anterior incision two finger breadths below the tibial tuberosity and then the marked out a posterior flap that was one third of the circumference of the calf in length.   I made the incisions for these flaps, and then dissected through the subcutaneous tissue, fascia, and muscle anteriorly.  I elevated  the periosteal tissue superiorly so that the tibia was about 3-4 cm shorter than the anterior skin flap.  I then transected the tibia with a power saw and then took a wedge off the tibia anteriorly with the power saw.  Then I smoothed out the rough edges.  In a  similar fashion, I cut back the fibula about two centimeters higher than the level of the tibia with a bone cutter.  I put a bone hook into the distal tibia and then used a large amputation knife to sharply develop a tissue plane through the muscle along the fibula.  In such fashion, the posterior flap was developed.  At this point, the specimen was passed off the field as the below-the-knee amputation.  At this point, I clamped all visibly bleeding arteries and veins using a combination of suture ligation with Silk suture and electrocautery.  Bleeding continued to be controlled with electrocautery and suture ligature.  The stump was washed off with sterile normal saline and no further active bleeding was noted.  I reapproximated the anterior and posterior fascia  with interrupted stitches of 0 Vicryl.  This was completed along the entire length of anterior and posterior fascia until there were no more loose space in the fascial line. I then placed a layer of 2-0 Vicryl sutures in the subcutaneous tissue. The skin was then  reapproximated with staples.  The stump was washed off and dried.  The incision was dressed with Xeroform and  then fluffs were applied.  Kerlix was wrapped around the leg and then gently an ACE wrap was applied.    COMPLICATIONS: none  CONDITION: stable   Leotis Pain  03/11/2018, 1:46 PM    This note was created with Dragon Medical transcription system. Any errors in dictation are purely unintentional.

## 2018-03-12 ENCOUNTER — Encounter: Payer: Self-pay | Admitting: Vascular Surgery

## 2018-03-12 DIAGNOSIS — Z89512 Acquired absence of left leg below knee: Secondary | ICD-10-CM

## 2018-03-12 LAB — CBC
HCT: 26.5 % — ABNORMAL LOW (ref 36.0–46.0)
Hemoglobin: 7.7 g/dL — ABNORMAL LOW (ref 12.0–15.0)
MCH: 30.1 pg (ref 26.0–34.0)
MCHC: 29.1 g/dL — ABNORMAL LOW (ref 30.0–36.0)
MCV: 103.5 fL — ABNORMAL HIGH (ref 80.0–100.0)
Platelets: 351 10*3/uL (ref 150–400)
RBC: 2.56 MIL/uL — ABNORMAL LOW (ref 3.87–5.11)
RDW: 19.9 % — ABNORMAL HIGH (ref 11.5–15.5)
WBC: 12 10*3/uL — ABNORMAL HIGH (ref 4.0–10.5)
nRBC: 0 % (ref 0.0–0.2)

## 2018-03-12 LAB — GLUCOSE, CAPILLARY
Glucose-Capillary: 69 mg/dL — ABNORMAL LOW (ref 70–99)
Glucose-Capillary: 87 mg/dL (ref 70–99)
Glucose-Capillary: 87 mg/dL (ref 70–99)
Glucose-Capillary: 108 mg/dL — ABNORMAL HIGH (ref 70–99)
Glucose-Capillary: 110 mg/dL — ABNORMAL HIGH (ref 70–99)

## 2018-03-12 MED ORDER — KETOROLAC TROMETHAMINE 15 MG/ML IJ SOLN
15.0000 mg | Freq: Four times a day (QID) | INTRAMUSCULAR | Status: DC
  Filled 2018-03-12: qty 1

## 2018-03-12 MED ORDER — OXYCODONE HCL 5 MG PO TABS
10.0000 mg | ORAL_TABLET | ORAL | Status: DC | PRN
  Administered 2018-03-12 – 2018-03-13 (×2): 10 mg via ORAL
  Filled 2018-03-12 (×3): qty 2

## 2018-03-12 MED ORDER — MORPHINE SULFATE (PF) 2 MG/ML IV SOLN
2.0000 mg | INTRAVENOUS | Status: DC | PRN
  Administered 2018-03-12 (×2): 2 mg via INTRAVENOUS
  Filled 2018-03-12 (×3): qty 1

## 2018-03-12 MED ORDER — GABAPENTIN 100 MG PO CAPS
100.0000 mg | ORAL_CAPSULE | Freq: Every day | ORAL | Status: DC
  Administered 2018-03-12 – 2018-03-17 (×5): 100 mg via ORAL
  Filled 2018-03-12 (×5): qty 1

## 2018-03-12 MED ORDER — OXYCODONE HCL 5 MG PO TABS
5.0000 mg | ORAL_TABLET | ORAL | Status: DC | PRN
  Administered 2018-03-12 – 2018-03-13 (×3): 5 mg via ORAL
  Filled 2018-03-12 (×4): qty 1

## 2018-03-12 MED ORDER — GABAPENTIN 100 MG PO CAPS
100.0000 mg | ORAL_CAPSULE | Freq: Once | ORAL | Status: AC
  Administered 2018-03-12: 100 mg via ORAL
  Filled 2018-03-12 (×2): qty 1

## 2018-03-12 NOTE — Clinical Social Work Note (Signed)
Patient is post op day 1 from a left BKA. CSW updated Magda Paganini with [facility]. Shela Leff MSW,LCSW [phone]

## 2018-03-12 NOTE — Progress Notes (Signed)
Pre HD assessment   03/12/18 0917  Vital Signs  Temp 98.5 F (36.9 C)  Temp Source Oral  Pulse Rate 62  Pulse Rate Source Monitor  Resp 11  BP (!) 114/55  BP Location Right Arm  BP Method Automatic  Patient Position (if appropriate) Lying  Oxygen Therapy  SpO2 94 %  O2 Device Room Air  Pain Assessment  Pain Scale 0-10  Pain Score 10  Pain Type Surgical pain  Pain Location Leg  Pain Orientation Left  Pain Descriptors / Indicators Aching  Pain Frequency Constant  Pain Onset On-going  Pain Intervention(s) RN made aware  Dialysis Weight  Weight 58.4 kg  Type of Weight Pre-Dialysis  Time-Out for Hemodialysis  What Procedure? HD   Pt Identifiers(min of two) First/Last Name;MRN/Account#  Correct Site? Yes  Correct Side? Yes  Correct Procedure? Yes  Consents Verified? Yes  Rad Studies Available? N/A  Safety Precautions Reviewed? Yes  Research scientist (physical sciences)  (2A)  Station Number 1  UF/Alarm Test Passed  Conductivity: Meter 14  Conductivity: Machine  14.1  pH 7.6  Reverse Osmosis main  Normal Saline Lot Number [id number]  Dialyzer Lot Number [id number]  Disposable Set Lot Number [id number]  Machine Temperature 98.6 F (37 C)  Musician and Audible Yes  Blood Lines Intact and Secured Yes  Pre Treatment Patient Checks  Vascular access used during treatment Catheter  Hepatitis B Surface Antigen Results Negative  Date Hepatitis B Surface Antigen Drawn 11/26/17  Hepatitis B Surface Antibody 147 (>10)  Date Hepatitis B Surface Antibody Drawn 11/26/17  Hemodialysis Consent Verified Yes  Hemodialysis Standing Orders Initiated Yes  ECG (Telemetry) Monitor On Yes  Prime Ordered Normal Saline  Length of  DialysisTreatment -hour(s) 3 Hour(s)  Dialyzer Elisio 17H NR  Dialysate 3K, 2.5 Ca  Dialysis Anticoagulant None  Dialysate Flow Ordered 600  Blood Flow Rate Ordered 400 mL/min  Ultrafiltration Goal 2 Liters  Pre Treatment Labs Renal panel;CBC  Dialysis Blood  Pressure Support Ordered Normal Saline  Education / Care Plan  Dialysis Education Provided Yes  Documented Education in Care Plan Yes  Hemodialysis Catheter Left Subclavian Double-lumen  Placement Date: 02/11/18   Placed prior to admission: No  Orientation: Left  Access Location: Subclavian  Hemodialysis Catheter Type: Double-lumen  Site Condition No complications  Blue Lumen Status Heparin locked  Red Lumen Status Heparin locked  Purple Lumen Status N/A  Dressing Type Biopatch  Dressing Status Clean;Dry;Dressing reinforced  Drainage Description None

## 2018-03-12 NOTE — Progress Notes (Signed)
Post HD assessment. Pt tolerated tx well without c/o or complication. Net UF 2018, goal met.    03/12/18 1238  Vital Signs  Temp 98.6 F (37 C)  Temp Source Oral  Pulse Rate 64  Pulse Rate Source Monitor  Resp 13  BP (!) 131/52  BP Location Right Arm  BP Method Automatic  Patient Position (if appropriate) Lying  Oxygen Therapy  SpO2 93 %  O2 Device Room Air  Dialysis Weight  Weight 56.5 kg  Type of Weight Post-Dialysis  Post-Hemodialysis Assessment  Rinseback Volume (mL) 250 mL  KECN 67.5 V  Dialyzer Clearance Lightly streaked  Duration of HD Treatment -hour(s) 3 hour(s)  Hemodialysis Intake (mL) 500 mL  UF Total -Machine (mL) 2518 mL  Net UF (mL) 2018 mL  Tolerated HD Treatment Yes  Education / Care Plan  Dialysis Education Provided Yes  Documented Education in Care Plan Yes  Hemodialysis Catheter Left Subclavian Double-lumen  Placement Date: 02/11/18   Placed prior to admission: No  Orientation: Left  Access Location: Subclavian  Hemodialysis Catheter Type: Double-lumen  Site Condition No complications  Blue Lumen Status Heparin locked  Red Lumen Status Heparin locked  Purple Lumen Status N/A  Catheter fill solution Heparin 1000 units/ml  Catheter fill volume (Arterial) 1.7 cc  Catheter fill volume (Venous) 1.7  Dressing Type Biopatch  Dressing Status Dressing reinforced  Drainage Description None  Post treatment catheter status Capped and Clamped   

## 2018-03-12 NOTE — Progress Notes (Signed)
PT Cancellation Note  Patient Details Name: Stephanie Ellis MRN: [id number] DOB: Mar 17, 1951   Cancelled Treatment:    Reason Eval/Treat Not Completed: Patient at procedure or test/unavailable(Pt leaving room for HD).  Will attempt to see pt again later today, schedule permitting.   Collie Siad PT, DPT 03/12/2018, 9:09 AM

## 2018-03-12 NOTE — Progress Notes (Signed)
HD tx end    03/12/18 1232  Vital Signs  Pulse Rate 62  Pulse Rate Source Monitor  Resp 16  BP (!) 138/51  BP Location Right Arm  BP Method Automatic  Patient Position (if appropriate) Lying  Oxygen Therapy  SpO2 93 %  O2 Device Room Air  During Hemodialysis Assessment  Dialysis Fluid Bolus Normal Saline  Bolus Amount (mL) 250 mL  Intra-Hemodialysis Comments Tx completed

## 2018-03-12 NOTE — Evaluation (Signed)
Physical Therapy Evaluation Patient Details Name: Stephanie Ellis MRN: [id number] DOB: 1950-05-10 Today's Date: 03/12/2018   History of Present Illness  Pt is a 67 y/o F who presented from [facility] with SOB and headache.  Pt noted to have elevated BP readings.  Pt with recent R BKA.  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  Pt with L eye blurred ision with known h/o hemorrhage L eye as well as new purulent discharge.  Pt's PMH includes CABG x3, MI, ESRD.      Clinical Impression  Patient is s/p above surgery resulting in functional limitations due to the deficits listed below (see PT Problem List). Stephanie Ellis is s/p L BKA with significant phantom pain as well as lethargy following HD today.  She is agreeable to work with therapy and was educated on phantom pain desensitization techniques, proper positioning of LLE using pillow, and performed SLR exercise.  Given pt's current mobility status, recommending SNF at d/c.  SpO2 87-88% on RA at rest, improved to 94% with therapeutic exercise, RN notified.  Patient will benefit from skilled PT to increase their independence and safety with mobility to allow discharge to the venue listed below.      Follow Up Recommendations SNF    Equipment Recommendations  Other (comment)(TBD at next venue of care)    Recommendations for Other Services       Precautions / Restrictions Precautions Precautions: Fall;Other (comment) Precaution Comments: permcath L chest; educated pt and daughter in proper positioning of LLE to avoid L knee F for proper healing using pillows to elevate distal LLE.   Restrictions Weight Bearing Restrictions: Yes LLE Weight Bearing: Non weight bearing      Mobility  Bed Mobility               General bed mobility comments: Deferred due to lethargy, SpO2 reading, and significant pain.   Transfers                    Ambulation/Gait                Stairs            Wheelchair Mobility    Modified  Rankin (Stroke Patients Only)       Balance                                             Pertinent Vitals/Pain Pain Assessment: Faces Faces Pain Scale: Hurts whole lot Pain Location: L phantom limb pain Pain Descriptors / Indicators: Cramping;Sharp;Shooting;Grimacing;Guarding;Moaning Pain Intervention(s): Limited activity within patient's tolerance;Monitored during session;Repositioned;Utilized relaxation techniques    Home Living Family/patient expects to be discharged to:: Skilled nursing facility                 Additional Comments: Daughter reports pt was living in a not very accessible mobile home prior to most recent admission but has been at [facility].  Daughter is hopeful that pt will be able to transition to ALF at [facility] after SNF at [facility].     Prior Function Level of Independence: Needs assistance   Gait / Transfers Assistance Needed: Pt says "I scooted around" at [facility]. Daughter reports pt would scoot in WC using LLE.  Pt required heavy assist to transfer with squat pivot to WC from bed. Pt denies any falls in the past 3  months.   ADL's / Homemaking Assistance Needed: Pt required assist with ADLs at [facility].         Hand Dominance        Extremity/Trunk Assessment   Upper Extremity Assessment Upper Extremity Assessment: Generalized weakness    Lower Extremity Assessment Lower Extremity Assessment: RLE deficits/detail;LLE deficits/detail RLE Deficits / Details: recent R BKA RLE Sensation: (denies phantom pain RLE) LLE Deficits / Details: L BKA 11/20 with phantom limb pain.  Pt able to perform SLR x10.  Unable to formally assess strength due to significant pain.  LLE: Unable to fully assess due to pain LLE Sensation: (phantom pain)       Communication   Communication: HOH  Cognition Arousal/Alertness: Awake/alert;Lethargic(sleepy following HD earlier today) Behavior During Therapy: WFL  for tasks assessed/performed Overall Cognitive Status: Impaired/Different from baseline Area of Impairment: Orientation;Attention;Memory;Following commands;Awareness;Problem solving                 Orientation Level: Disoriented to;Time;Place Current Attention Level: Selective Memory: Decreased short-term memory Following Commands: Follows one step commands inconsistently   Awareness: Intellectual Problem Solving: Slow processing;Decreased initiation;Difficulty sequencing;Requires verbal cues;Requires tactile cues General Comments: Daughter reports her altered cognition is normal for her s/p dialysis and says this clears up and is typically back to normal the next day.  Daughter reports pt's cognition WNL at baseline.       General Comments General comments (skin integrity, edema, etc.): Daughter present during evaluation.  Pt's vitals monitored during session (all in supine).  At rest: 117/47 BP, pule 64, SpO2 88% on RA.  During therapeutic exercise: SpO2 94% on RA.  At end of session after exercises: 118/48 BP, pulse 62, SpO2 87% on RA.     Exercises General Exercises - Lower Extremity Straight Leg Raises: Other reps (comment);AROM;Strengthening;Left(8 reps) Other Exercises Other Exercises: Performed gentle touch and squeezing to distal LLE for desensitization.  Educated pt and daughter in this technique for pt to use intermittently throughout the day, especially with onset of phantom pain.   Other Exercises: Educated pt and daughter on proper positioning of distal LLE for proper healing (see precaution comments above)   Assessment/Plan    PT Assessment Patient needs continued PT services  PT Problem List Decreased strength;Decreased range of motion;Pain;Decreased activity tolerance;Decreased balance;Decreased mobility;Decreased cognition;Decreased knowledge of use of DME;Decreased safety awareness;Decreased knowledge of precautions;Cardiopulmonary status limiting  activity;Impaired sensation       PT Treatment Interventions DME instruction;Functional mobility training;Therapeutic activities;Therapeutic exercise;Balance training;Neuromuscular re-education;Cognitive remediation;Patient/family education;Wheelchair mobility training;Modalities;Manual techniques    PT Goals (Current goals can be found in the Care Plan section)  Acute Rehab PT Goals Patient Stated Goal: decreased pain PT Goal Formulation: With patient Time For Goal Achievement: 03/26/18 Potential to Achieve Goals: Fair    Frequency 7X/week   Barriers to discharge        Co-evaluation               AM-PAC PT "6 Clicks" Daily Activity  Outcome Measure Difficulty turning over in bed (including adjusting bedclothes, sheets and blankets)?: Unable Difficulty moving from lying on back to sitting on the side of the bed? : Unable Difficulty sitting down on and standing up from a chair with arms (e.g., wheelchair, bedside commode, etc,.)?: Unable Help needed moving to and from a bed to chair (including a wheelchair)?: Total Help needed walking in hospital room?: Total Help needed climbing 3-5 steps with a railing? : Total 6 Click Score: 6    End of  Session   Activity Tolerance: Patient limited by pain;Patient limited by lethargy;Treatment limited secondary to medical complications (Comment)(limited by SpO2 at rest, RN notified) Patient left: in bed;with call bell/phone within reach;with bed alarm set;with family/visitor present Nurse Communication: Mobility status;Other (comment)(SpO2) PT Visit Diagnosis: Pain;Muscle weakness (generalized) (M62.81);Difficulty in walking, not elsewhere classified (R26.2);Unsteadiness on feet (R26.81) Pain - Right/Left: Left Pain - part of body: Leg    Time: 0786-7544 PT Time Calculation (min) (ACUTE ONLY): 27 min   Charges:   PT Evaluation $PT Eval Moderate Complexity: 1 Mod PT Treatments $Self Care/Home Management: 8-22         Session was performed by student PT, Belva Crome, and directed, overseen, and documented by this PT.  Collie Siad PT, DPT 03/12/2018, 3:40 PM

## 2018-03-12 NOTE — Progress Notes (Signed)
 Vein & Vascular Surgery  Daily Progress Note   Subjective: 1 Day Post-Op: Left below-the-knee amputation  Seen during dialysis. Patient complaining of left stump pain. States pain is worse then when she underwent right BKA. No issues night of surgery.   Objective: Vitals:   03/12/18 0945 03/12/18 1000 03/12/18 1015 03/12/18 1030  BP: (!) 119/45 (!) 96/46 (!) 105/45 (!) 124/50  Pulse: 62 (!) 59 62 61  Resp: 12 (!) 21 13 14   Temp:      TempSrc:      SpO2: 98% 95% 93% 95%  Weight:      Height:        Intake/Output Summary (Last 24 hours) at 03/12/2018 1048 Last data filed at 03/11/2018 2130 Gross per 24 hour  Intake 550 ml  Output 100 ml  Net 450 ml   Physical Exam: A&Ox3, NAD CV: RRR Permcath: Intact. No signs of infection noted. Seems to be working fine during dialysis.  Pulmonary: CTA Bilaterally Abdomen: Soft, Nontender, Nondistended, (+) Bowel Sounds Vascular:  Right Lower Extremity: BKA stump healing. Skin intact.   Left Lower Extremity: OR dressing intact, clean and dry. Thigh is soft.    Laboratory: CBC    Component Value Date/Time   WBC 12.0 (H) 03/12/2018 0950   HGB 7.7 (L) 03/12/2018 0950   HCT 26.5 (L) 03/12/2018 0950   PLT 351 03/12/2018 0950   BMET    Component Value Date/Time   NA 142 03/11/2018 0457   K 3.8 03/11/2018 0457   CL 99 03/11/2018 0457   CO2 32 03/11/2018 0457   GLUCOSE 129 (H) 03/11/2018 0457   BUN 21 03/11/2018 0457   CREATININE 3.86 (H) 03/11/2018 0457   CALCIUM 8.6 (L) 03/11/2018 0457   GFRNONAA 11 (L) 03/11/2018 0457   GFRAA 13 (L) 03/11/2018 0457   Assessment/Planning: 67 year old female with a past medical history of severe PAD, POD #1 Left BKA: Stable 1) One gram drop in Hbg. 7.7 - Asymptomatic at this time. AM labs. Will follow.  2) Oxycodone, Morphine, Toradol and Neurontin for improved pain control 3) PT and OT evaluation  Discussed with Dr. Ellis Parents Stegmayer PA-C 03/12/2018 10:48 AM

## 2018-03-12 NOTE — Progress Notes (Signed)
HD tx start    03/12/18 1470  Vital Signs  Pulse Rate 63  Pulse Rate Source Monitor  Resp 15  BP (!) 135/47  BP Location Right Arm  BP Method Automatic  Patient Position (if appropriate) Lying  Oxygen Therapy  SpO2 98 %  O2 Device Room Air  During Hemodialysis Assessment  Blood Flow Rate (mL/min) 400 mL/min  Arterial Pressure (mmHg) -150 mmHg  Venous Pressure (mmHg) 100 mmHg  Transmembrane Pressure (mmHg) 60 mmHg  Ultrafiltration Rate (mL/min) 830 mL/min  Dialysate Flow Rate (mL/min) 600 ml/min  Conductivity: Machine  14.2  HD Safety Checks Performed Yes  Dialysis Fluid Bolus Normal Saline  Bolus Amount (mL) 250 mL  Intra-Hemodialysis Comments Tx initiated  Hemodialysis Catheter Left Subclavian Double-lumen  Placement Date: 02/11/18   Placed prior to admission: No  Orientation: Left  Access Location: Subclavian  Hemodialysis Catheter Type: Double-lumen  Blue Lumen Status Infusing  Red Lumen Status Infusing

## 2018-03-12 NOTE — Progress Notes (Signed)
[place] [facility] [place] NAME: Stephanie Ellis    MR#:  [id number]  DATE OF BIRTH:  Aug 16, 1950  SUBJECTIVE:  S/p amputation on    REVIEW OF SYSTEMS:    Review of Systems  Constitutional: Negative for fever, chills weight loss HENT: Negative for ear pain, nosebleeds, facial swelling, rhinorrhea, neck pain, neck stiffness and ear discharge.   Nasal congestion Respiratory: Negative for cough, shortness of breath, wheezing  Cardiovascular: Negative for chest pain, palpitations and leg swelling.  Gastrointestinal: Negative for heartburn, abdominal pain, vomiting, diarrhea or consitpation Genitourinary: Negative for dysuria, urgency, frequency, hematuria Musculoskeletal: Negative for back pain or joint pain Neurological: Negative for dizziness, seizures, syncope, focal weakness,  numbness and headaches.  Hematological: Does not bruise/bleed easily.  Psychiatric/Behavioral: Negative for hallucinations, confusion, dysphoric mood   Tolerating Diet: yes    DRUG ALLERGIES:   Allergies  Allergen Reactions  . Chlorthalidone Anaphylaxis, Itching and Rash  . Fentanyl Rash  . Midazolam Rash  . Ace Inhibitors Other (See Comments)    Reaction:  Hyperkalemia, agitation   . Angiotensin Receptor Blockers Other (See Comments)    Reaction:  Hyperkalemia, agitation   . Norvasc [Amlodipine] Itching and Rash  . Phenergan [Promethazine Hcl] Anxiety    "antsy, can't sit still"    VITALS:  Blood pressure (!) 116/54, pulse 63, temperature 98.5 F (36.9 C), temperature source Oral, resp. rate 18, height 5' 4"  (1.626 m), weight 56.5 kg, SpO2 93 %.  PHYSICAL EXAMINATION:  Constitutional: Appears well-developed and well-nourished. No distress. HENT: Normocephalic. Marland Kitchen Oropharynx is clear and moist.  Eyes: Conjunctivae and EOM are normal. PERRLA, no scleral icterus.  Neck: Normal ROM. Neck supple. No JVD. No tracheal deviation. CVS: RRR, S1/S2 +, no murmurs, no  gallops, no carotid bruit.  Pulmonary: Effort and breath sounds normal, no stridor, rhonchi, wheezes, rales.  Abdominal: Soft. BS +,  no distension, tenderness, rebound or guarding.  Musculoskeletal: Status post right BKA, left BKA recent with dressing. Neuro: Alert. CN 2-12 grossly intact. No focal deficits. Skin: Skin is warm and dry. No rash noted. Psychiatric: Normal mood and affect.      LABORATORY PANEL:   CBC Recent Labs  Lab 03/12/18 0950  WBC 12.0*  HGB 7.7*  HCT 26.5*  PLT 351   ------------------------------------------------------------------------------------------------------------------  Chemistries  Recent Labs  Lab 03/11/18 0457  NA 142  K 3.8  CL 99  CO2 32  GLUCOSE 129*  BUN 21  CREATININE 3.86*  CALCIUM 8.6*  MG 1.9   ------------------------------------------------------------------------------------------------------------------  Cardiac Enzymes Recent Labs  Lab 03/10/18 0931 03/10/18 1559 03/10/18 2221  TROPONINI 0.04* 0.04* 0.04*   ------------------------------------------------------------------------------------------------------------------  RADIOLOGY:  No results found.   ASSESSMENT AND PLAN:   67 y.o. 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  1.    Peripheral arterial disease in bilateral lower extremities due to Diabetes  Left foot second and third toes with gangrene Nonsalvageable left lower extremity per podiatry Vascular surgery had done left BKA 03/11/18 N.p.o.   2.  Hypertensive urgency: Blood pressure is elevated still  continue home  medications including isosorbide and Coreg dose increased to 6.25.  Hydralazine 25 mg is added to the regimen will titrate as needed.  BP stable now.  3.  Diabetes mellitus with complications of PAD and CKD: Continue ADA diet with Lantus and sliding scale  4.  Left eye blurred vision-reporting purulent discharge today  appreciate ophthalmology input in the ED.  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  She had prior laser capsulotomy.  Eyedrops recommended at this time.  She will need pars plano vitrectomy-recommended outpatient follow-up after discharge. Add erythromycin ointment to the eye  5.  Acute hypoxic respiratory failure due to pulmonary edema from end-stage renal disease needing dialysis:  Clinically better.  Status post hemodialysis for the past 2 days oxygen to be weaned off   6.  End-stage renal disease on hemodialysis: Had hemodialysis yesterday and today She will continue with her normal dialysis schedule Tuesday, Thursday and Saturday   7.    History of tricuspid endocarditis-with MSSA.  Currently on cefazolin with dialysis.  Continue until 03/13/2018.   D/w dr Abigail Butts, vascular surgery   Management plans discussed with the patient and she is in agreement.  CODE STATUS: full  TOTAL TIME TAKING CARE OF THIS PATIENT: 35 minutes.     POSSIBLE D/C 2-3 days, DEPENDING ON CLINICAL CONDITION.   Vaughan Basta M.D on 03/12/2018 at 1:21 PM  Between 7am to 6pm - Pager - [phone] After 6pm go to [email or web address] - password [email or web address] [facility]  Office  [phone]  CC: Primary care physician; [facility]  Note: This dictation was prepared with Dragon dictation along with smaller phrase technology. Any transcriptional errors that result from this process are unintentional.

## 2018-03-12 NOTE — Progress Notes (Signed)
Pharmacy Antibiotic Note  Stephanie Ellis is a 67 y.o. female admitted on 03/08/2018 with Endocarditis.  Pharmacy has been consulted for cefazolin dosing. Patient has ESRD and is on HD TTS. She was originally started on 1 gram every 24 hours after HD. However, prior to last discharge Dr Delaine Lame started the patient on Ancef 2 grams pHD T/Th and 3 grams pHD on Sa to be completed through 11/22. The dosing was changed on 11/19 to the suggested therapy but the Saturday HD session (11/17) was moved to 11/18 and the dose administered was only one gram. Because of these facts I   Plan: Complete Ancef 2 grams pHD T/Th and 3 grams pHD on Sa to be completed through 11/23 due to the aforementioned lower dose on 11/18  Height: 5' 4"  (162.6 cm) Weight: 124 lb 9 oz (56.5 kg) IBW/kg (Calculated) : 54.7  Temp (24hrs), Avg:98.3 F (36.8 C), Min:97.6 F (36.4 C), Max:98.6 F (37 C)  Recent Labs  Lab 03/08/18 1349 03/10/18 0302 03/10/18 0931 03/11/18 0457 03/12/18 0950  WBC 11.1* 9.0 9.4 10.4 12.0*  CREATININE 4.83*  --  5.07* 3.86*  --     Estimated Creatinine Clearance: 12.2 mL/min (A) (by C-G formula based on SCr of 3.86 mg/dL (H)).    Allergies  Allergen Reactions  . Chlorthalidone Anaphylaxis, Itching and Rash  . Fentanyl Rash  . Midazolam Rash  . Ace Inhibitors Other (See Comments)    Reaction:  Hyperkalemia, agitation   . Angiotensin Receptor Blockers Other (See Comments)    Reaction:  Hyperkalemia, agitation   . Norvasc [Amlodipine] Itching and Rash  . Phenergan [Promethazine Hcl] Anxiety    "antsy, can't sit still"    Antimicrobials this admission: 11/17 ancef >> 11/23  Dose adjustments this admission: Patient is on HD.  Microbiology results: MRAS PCR (-)   Thank you for allowing pharmacy to be a part of this patient's care.  Dallie Piles, PharmD 03/12/2018 1:14 PM

## 2018-03-12 NOTE — Progress Notes (Signed)
Post HD assessment    03/12/18 1237  Neurological  Level of Consciousness Alert  Orientation Level Oriented X4  Respiratory  Respiratory Pattern Regular;Unlabored  Chest Assessment Chest expansion symmetrical  Cardiac  ECG Monitor Yes  Cardiac Rhythm SB;NSR  Vascular  R Radial Pulse +2  L Radial Pulse +2  Edema Generalized  Integumentary  Integumentary (WDL) X  Skin Color Appropriate for ethnicity  Skin Integrity Amputation  Musculoskeletal  Musculoskeletal (WDL) X  Generalized Weakness Yes  Assistive Device None  GU Assessment  Genitourinary (WDL) X  Genitourinary Symptoms  (HD )  Psychosocial  Psychosocial (WDL) WDL  Patient Behaviors Cooperative;Calm  Needs Expressed Physical  Emotional support given Given to patient

## 2018-03-12 NOTE — Progress Notes (Signed)
[facility]  ROUNDING NOTE   Subjective:   Seen and examined on hemodialysis. Tolerating treatment well.   Status post BKA left lower extremity. Dr. Lucky Cowboy 11/20    HEMODIALYSIS FLOWSHEET:  Blood Flow Rate (mL/min): 400 mL/min Arterial Pressure (mmHg): -190 mmHg Venous Pressure (mmHg): 140 mmHg Transmembrane Pressure (mmHg): 50 mmHg Ultrafiltration Rate (mL/min): 830 mL/min Dialysate Flow Rate (mL/min): 600 ml/min Conductivity: Machine : 14.2 Conductivity: Machine : 14.2 Dialysis Fluid Bolus: Normal Saline Bolus Amount (mL): 250 mL    Objective:  Vital signs in last 24 hours:  Temp:  [97.6 F (36.4 C)-98.5 F (36.9 C)] 98.5 F (36.9 C) (11/21 0917) Pulse Rate:  [58-66] 61 (11/21 1145) Resp:  [10-21] 20 (11/21 1145) BP: (96-177)/(44-98) 105/44 (11/21 1145) SpO2:  [93 %-100 %] 96 % (11/21 1145) Weight:  [58.4 kg] 58.4 kg (11/21 0917)  Weight change: -1.4 kg Filed Weights   03/10/18 1222 03/11/18 1058 03/12/18 0917  Weight: 58.9 kg 58.9 kg 58.4 kg    Intake/Output: I/O last 3 completed shifts: In: 550 [P.O.:300; I.V.:250] Out: 100 [Blood:100]   Intake/Output this shift:  No intake/output data recorded.  Physical Exam: General: NAD,   Head: Normocephalic, atraumatic. Moist oral mucosal membranes  Eyes: Anicteric, PERRL  Neck: Supple, trachea midline  Lungs:  clear  Heart: Regular rate and rhythm  Abdomen:  Soft, nontender,   Extremities:  no edema, right BKA, left with ischemic changes  Neurologic: Nonfocal, moving all four extremities  Skin: No lesions  Access: Left IJ permcath    Basic Metabolic Panel: Recent Labs  Lab 03/08/18 1349 03/10/18 0931 03/11/18 0457  NA 140 141 142  K 4.0 3.9 3.8  CL 99 100 99  CO2 31 31 32  GLUCOSE 76 131* 129*  BUN 23 23 21   CREATININE 4.83* 5.07* 3.86*  CALCIUM 9.0 8.5* 8.6*  MG  --   --  1.9    Liver Function Tests: No results for input(s): AST, ALT, ALKPHOS, BILITOT, PROT, ALBUMIN in the last  168 hours. No results for input(s): LIPASE, AMYLASE in the last 168 hours. No results for input(s): AMMONIA in the last 168 hours.  CBC: Recent Labs  Lab 03/08/18 1349 03/10/18 0302 03/10/18 0931 03/11/18 0457 03/12/18 0950  WBC 11.1* 9.0 9.4 10.4 12.0*  HGB 8.9* 9.0* 8.3* 8.6* 7.7*  HCT 29.3* 30.3* 28.0* 29.4* 26.5*  MCV 101.4* 102.7* 102.9* 103.2* 103.5*  PLT 406* 373 353 360 351    Cardiac Enzymes: Recent Labs  Lab 03/08/18 1349 03/08/18 1630 03/10/18 0931 03/10/18 1559 03/10/18 2221  TROPONINI 0.05* 0.05* 0.04* 0.04* 0.04*    BNP: Invalid input(s): POCBNP  CBG: Recent Labs  Lab 03/11/18 1323 03/11/18 1627 03/11/18 2105 03/12/18 0718 03/12/18 0753  GLUCAP 101* 110* 127* 69* 46    Microbiology: Results for orders placed or performed during the hospital encounter of 03/08/18  MRSA PCR Screening     Status: None   Collection Time: 03/10/18  8:24 AM  Result Value Ref Range Status   MRSA by PCR NEGATIVE NEGATIVE Final    Comment:        The GeneXpert MRSA Assay (FDA approved for NASAL specimens only), is one component of a comprehensive MRSA colonization surveillance program. It is not intended to diagnose MRSA infection nor to guide or monitor treatment for MRSA infections. Performed at [facility], [address], [place], [place] [address]     Coagulation Studies: Recent Labs    03/11/18 [phone]  LABPROT 15.6*  INR 1.25    Urinalysis: No results for input(s): COLORURINE, LABSPEC, PHURINE, GLUCOSEU, HGBUR, BILIRUBINUR, KETONESUR, PROTEINUR, UROBILINOGEN, NITRITE, LEUKOCYTESUR in the last 72 hours.  Invalid input(s): APPERANCEUR    Imaging: No results found.   Medications:   . [START ON 03/14/2018]  ceFAZolin (ANCEF) IV    .  ceFAZolin (ANCEF) IV 2 g (03/10/18 1738)   . apixaban  2.5 mg Oral BID  . aspirin EC  81 mg Oral Daily  . atorvastatin  80 mg Oral QHS  . brimonidine  1 drop Both Eyes TID  . calcitRIOL  0.5 mcg  Oral Daily  . carvedilol  6.25 mg Oral BID  . Chlorhexidine Gluconate Cloth  6 each Topical Q0600  . docusate sodium  100 mg Oral BID  . epoetin (EPOGEN/PROCRIT) injection  10,000 Units Intravenous Q T,Th,Sa-HD  . erythromycin   Left Eye Q6H  . famotidine  20 mg Oral Daily  . feeding supplement (NEPRO CARB STEADY)  237 mL Oral BID BM  . feeding supplement (PRO-STAT SUGAR FREE 64)  30 mL Oral TID  . folic acid  1 mg Oral Daily  . gabapentin  100 mg Oral QHS  . gabapentin  100 mg Oral Once  . hydrALAZINE  25 mg Oral Q8H  . insulin aspart  0-5 Units Subcutaneous QHS  . insulin aspart  0-9 Units Subcutaneous TID WC  . insulin glargine  5 Units Subcutaneous QHS  . isosorbide mononitrate  30 mg Oral Daily  . ketorolac  15 mg Intravenous Q6H  . mirtazapine  7.5 mg Oral QHS  . multivitamin  1 tablet Oral Daily  . omega-3 acid ethyl esters  1 capsule Oral Daily  . polyvinyl alcohol  1 drop Both Eyes BID  . sevelamer carbonate  800 mg Oral TID WC  . timolol  1 drop Both Eyes TID  . vitamin C  500 mg Oral BID   acetaminophen **OR** acetaminophen, hydrALAZINE, LORazepam **OR** LORazepam, morphine injection, ondansetron **OR** ondansetron (ZOFRAN) IV, oxyCODONE, oxyCODONE, sodium chloride  Assessment/ Plan:  [name] is a 28 y.o. 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  [facility] [place] TTS hemodialysis RIJ permcath  1. End Stage Renal Disease  Seen and examined on regularly scheduled dialysis treatment  2. Ischemic limb with osteomyelitis and endocarditis - Cefazolin 2g - last dose today.   3. Anemia of chronic kidney disease: hemoglobin 7.7 - EPO 10000 units with hemodialysis treatment TTS  4. Hypertension:  - carvedilol, hydralazine and isosorbide  mononitrate  5. Secondary Hyperparathyroidism: holding sevelamer due to hypophosphatemia   LOS: 4 Antonea Gaut 11/21/201912:08 PM

## 2018-03-12 NOTE — Progress Notes (Signed)
Pre HD assessment    03/12/18 0918  Neurological  Level of Consciousness Alert  Orientation Level Oriented X4  Respiratory  Respiratory Pattern Regular;Unlabored  Chest Assessment Chest expansion symmetrical  Cardiac  ECG Monitor Yes  Cardiac Rhythm NSR  Vascular  R Radial Pulse +2  L Radial Pulse +2  Edema Generalized  Integumentary  Integumentary (WDL) X  Skin Color Appropriate for ethnicity  Skin Integrity Amputation  Musculoskeletal  Musculoskeletal (WDL) X  Generalized Weakness Yes  Assistive Device None  GU Assessment  Genitourinary (WDL) X  Genitourinary Symptoms  (HD)  Psychosocial  Psychosocial (WDL) WDL  Patient Behaviors Cooperative;Calm;Restless  Needs Expressed Physical  Emotional support given Given to patient

## 2018-03-13 ENCOUNTER — Inpatient Hospital Stay: Payer: Medicare Other

## 2018-03-13 DIAGNOSIS — R61 Generalized hyperhidrosis: Secondary | ICD-10-CM

## 2018-03-13 LAB — CBC
HCT: 32 % — ABNORMAL LOW (ref 36.0–46.0)
Hemoglobin: 9.3 g/dL — ABNORMAL LOW (ref 12.0–15.0)
MCH: 30.2 pg (ref 26.0–34.0)
MCHC: 29.1 g/dL — ABNORMAL LOW (ref 30.0–36.0)
MCV: 103.9 fL — ABNORMAL HIGH (ref 80.0–100.0)
Platelets: 348 10*3/uL (ref 150–400)
RBC: 3.08 MIL/uL — ABNORMAL LOW (ref 3.87–5.11)
RDW: 20.1 % — ABNORMAL HIGH (ref 11.5–15.5)
WBC: 14.1 10*3/uL — ABNORMAL HIGH (ref 4.0–10.5)
nRBC: 0.1 % (ref 0.0–0.2)

## 2018-03-13 LAB — GLUCOSE, CAPILLARY
Glucose-Capillary: 114 mg/dL — ABNORMAL HIGH (ref 70–99)
Glucose-Capillary: 120 mg/dL — ABNORMAL HIGH (ref 70–99)
Glucose-Capillary: 125 mg/dL — ABNORMAL HIGH (ref 70–99)
Glucose-Capillary: 129 mg/dL — ABNORMAL HIGH (ref 70–99)
Glucose-Capillary: 139 mg/dL — ABNORMAL HIGH (ref 70–99)
Glucose-Capillary: 143 mg/dL — ABNORMAL HIGH (ref 70–99)
Glucose-Capillary: 148 mg/dL — ABNORMAL HIGH (ref 70–99)
Glucose-Capillary: 45 mg/dL — ABNORMAL LOW (ref 70–99)
Glucose-Capillary: 55 mg/dL — ABNORMAL LOW (ref 70–99)
Glucose-Capillary: 95 mg/dL (ref 70–99)

## 2018-03-13 LAB — BASIC METABOLIC PANEL
Anion gap: 13 (ref 5–15)
BUN: 25 mg/dL — ABNORMAL HIGH (ref 8–23)
CO2: 26 mmol/L (ref 22–32)
Calcium: 8.6 mg/dL — ABNORMAL LOW (ref 8.9–10.3)
Chloride: 97 mmol/L — ABNORMAL LOW (ref 98–111)
Creatinine, Ser: 5.08 mg/dL — ABNORMAL HIGH (ref 0.44–1.00)
GFR calc Af Amer: 9 mL/min — ABNORMAL LOW (ref >60)
GFR calc non Af Amer: 8 mL/min — ABNORMAL LOW (ref >60)
Glucose, Bld: 143 mg/dL — ABNORMAL HIGH (ref 70–99)
Potassium: 4.9 mmol/L (ref 3.5–5.1)
Sodium: 136 mmol/L (ref 135–145)

## 2018-03-13 LAB — MAGNESIUM: Magnesium: 2.1 mg/dL (ref 1.7–2.4)

## 2018-03-13 LAB — VITAMIN B12: Vitamin B-12: 996 pg/mL — ABNORMAL HIGH (ref 180–914)

## 2018-03-13 LAB — TSH: TSH: 13.051 u[IU]/mL — ABNORMAL HIGH (ref 0.350–4.500)

## 2018-03-13 LAB — SURGICAL PATHOLOGY

## 2018-03-13 MED ORDER — VITAMIN B-1 100 MG PO TABS
100.0000 mg | ORAL_TABLET | Freq: Every day | ORAL | Status: DC
  Administered 2018-03-13 – 2018-03-16 (×4): 100 mg via ORAL
  Filled 2018-03-13 (×4): qty 1

## 2018-03-13 MED ORDER — POLYETHYLENE GLYCOL 3350 17 G PO PACK
17.0000 g | PACK | Freq: Every day | ORAL | Status: DC
  Administered 2018-03-13 – 2018-03-18 (×4): 17 g via ORAL
  Filled 2018-03-13 (×4): qty 1

## 2018-03-13 MED ORDER — DEXTROSE 50 % IV SOLN
25.0000 mL | Freq: Once | INTRAVENOUS | Status: AC
  Administered 2018-03-13: 25 mL via INTRAVENOUS
  Filled 2018-03-13: qty 50

## 2018-03-13 MED ORDER — METOCLOPRAMIDE HCL 5 MG/ML IJ SOLN
5.0000 mg | Freq: Once | INTRAMUSCULAR | Status: AC
  Administered 2018-03-13: 5 mg via INTRAVENOUS
  Filled 2018-03-13: qty 2

## 2018-03-13 MED ORDER — MORPHINE SULFATE (PF) 2 MG/ML IV SOLN: 2.0000 mg | INTRAVENOUS | Status: DC | PRN

## 2018-03-13 NOTE — Progress Notes (Signed)
[facility]  ROUNDING NOTE   Subjective:   Family at bedside.  Hypoglycemic episode this morning.   Hemodialysis treatment yesterday. Tolerated treatment well. UF of 2 liters  Objective:  Vital signs in last 24 hours:  Temp:  [97.7 F (36.5 C)-98.5 F (36.9 C)] 97.7 F (36.5 C) (11/22 1226) Pulse Rate:  [65-69] 69 (11/22 1226) Resp:  [18-20] 18 (11/22 1226) BP: (110-162)/(49-77) 157/77 (11/22 1226) SpO2:  [89 %-100 %] 100 % (11/22 0649)  Weight change: -0.5 kg Filed Weights   03/11/18 1058 03/12/18 0917 03/12/18 1238  Weight: 58.9 kg 58.4 kg 56.5 kg    Intake/Output: I/O last 3 completed shifts: In: 180 [P.O.:180] Out: 2018 [Other:2018]   Intake/Output this shift:  No intake/output data recorded.  Physical Exam: General: NAD,   Head: Normocephalic, atraumatic. Moist oral mucosal membranes  Eyes: Anicteric, PERRL  Neck: Supple, trachea midline  Lungs:  clear  Heart: Regular rate and rhythm  Abdomen:  Soft, nontender,   Extremities:  bilateral BKA - dressings are clean  Neurologic: Nonfocal, moving all four extremities  Skin: No lesions  Access: Left IJ permcath    Basic Metabolic Panel: Recent Labs  Lab 03/08/18 1349 03/10/18 0931 03/11/18 0457  NA 140 141 142  K 4.0 3.9 3.8  CL 99 100 99  CO2 31 31 32  GLUCOSE 76 131* 129*  BUN 23 23 21   CREATININE 4.83* 5.07* 3.86*  CALCIUM 9.0 8.5* 8.6*  MG  --   --  1.9    Liver Function Tests: No results for input(s): AST, ALT, ALKPHOS, BILITOT, PROT, ALBUMIN in the last 168 hours. No results for input(s): LIPASE, AMYLASE in the last 168 hours. No results for input(s): AMMONIA in the last 168 hours.  CBC: Recent Labs  Lab 03/10/18 0302 03/10/18 0931 03/11/18 0457 03/12/18 0950 03/13/18 0313  WBC 9.0 9.4 10.4 12.0* 14.1*  HGB 9.0* 8.3* 8.6* 7.7* 9.3*  HCT 30.3* 28.0* 29.4* 26.5* 32.0*  MCV 102.7* 102.9* 103.2* 103.5* 103.9*  PLT 373 353 360 351 348    Cardiac Enzymes: Recent Labs   Lab 03/08/18 1349 03/08/18 1630 03/10/18 0931 03/10/18 1559 03/10/18 2221  TROPONINI 0.05* 0.05* 0.04* 0.04* 0.04*    BNP: Invalid input(s): POCBNP  CBG: Recent Labs  Lab 03/13/18 0811 03/13/18 0844 03/13/18 0909 03/13/18 0931 03/13/18 1137  GLUCAP 55* 125* 114* 120* 95    Microbiology: Results for orders placed or performed during the hospital encounter of 03/08/18  MRSA PCR Screening     Status: None   Collection Time: 03/10/18  8:24 AM  Result Value Ref Range Status   MRSA by PCR NEGATIVE NEGATIVE Final    Comment:        The GeneXpert MRSA Assay (FDA approved for NASAL specimens only), is one component of a comprehensive MRSA colonization surveillance program. It is not intended to diagnose MRSA infection nor to guide or monitor treatment for MRSA infections. Performed at [facility], [address] [place], [place] [address]     Coagulation Studies: Recent Labs    03/11/18 0457  LABPROT 15.6*  INR 1.25    Urinalysis: No results for input(s): COLORURINE, LABSPEC, PHURINE, GLUCOSEU, HGBUR, BILIRUBINUR, KETONESUR, PROTEINUR, UROBILINOGEN, NITRITE, LEUKOCYTESUR in the last 72 hours.  Invalid input(s): APPERANCEUR    Imaging: No results found.   Medications:   . [START ON 03/14/2018]  ceFAZolin (ANCEF) IV     . apixaban  2.5 mg Oral BID  . aspirin EC  81 mg Oral Daily  . atorvastatin  80 mg Oral QHS  . brimonidine  1 drop Both Eyes TID  . calcitRIOL  0.5 mcg Oral Daily  . carvedilol  6.25 mg Oral BID  . Chlorhexidine Gluconate Cloth  6 each Topical Q0600  . docusate sodium  100 mg Oral BID  . epoetin (EPOGEN/PROCRIT) injection  10,000 Units Intravenous Q T,Th,Sa-HD  . erythromycin   Left Eye Q6H  . famotidine  20 mg Oral Daily  . feeding supplement (NEPRO CARB STEADY)  237 mL Oral BID BM  . feeding supplement (PRO-STAT SUGAR FREE 64)  30 mL Oral TID  . folic acid  1 mg Oral Daily  . gabapentin  100 mg Oral QHS  .  hydrALAZINE  25 mg Oral Q8H  . isosorbide mononitrate  30 mg Oral Daily  . mirtazapine  7.5 mg Oral QHS  . multivitamin  1 tablet Oral Daily  . omega-3 acid ethyl esters  1 capsule Oral Daily  . polyvinyl alcohol  1 drop Both Eyes BID  . sevelamer carbonate  800 mg Oral TID WC  . timolol  1 drop Both Eyes TID  . vitamin C  500 mg Oral BID   acetaminophen **OR** acetaminophen, hydrALAZINE, LORazepam **OR** LORazepam, morphine injection, ondansetron **OR** ondansetron (ZOFRAN) IV, oxyCODONE, oxyCODONE, sodium chloride  Assessment/ Plan:  Ms. Rayna Brenner is a 67 y.o. 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  [facility] [place] TTS hemodialysis RIJ permcath  1. End Stage Renal Disease  TTS schedule  2. Ischemic limb with osteomyelitis and endocarditis Completed course of  Cefazolin    3. Anemia of chronic kidney disease:   - EPO 10000 units with hemodialysis treatment TTS  4. Hypertension:  - carvedilol, hydralazine and isosorbide mononitrate  5. Secondary Hyperparathyroidism: holding sevelamer due to hypophosphatemia   LOS: 5 Koran Seabrook 11/22/20192:19 PM

## 2018-03-13 NOTE — Progress Notes (Signed)
PT Cancellation Note  Patient Details Name: Stephanie Ellis MRN: [id number] DOB: [birth year]   Cancelled Treatment:    Reason Eval/Treat Not Completed: Medical issues which prohibited therapy(rapid response called earlier today).  Will attempt to see pt again next date.  Collie Siad PT, DPT 03/13/2018, 3:42 PM

## 2018-03-13 NOTE — Progress Notes (Signed)
   03/13/18 0901  Clinical Encounter Type  Visited With Patient and family together  Visit Type Initial;Spiritual support;Critical Care  Referral From Nurse  Consult/Referral To Chaplain  Spiritual Encounters  Spiritual Needs Prayer;Other (Comment)   [facility] received a RR for the patient. I responded the page and provided Pastoral presences and spiritual support. The RR was called after the patient was stabilized.

## 2018-03-13 NOTE — Care Management Important Message (Signed)
Copy of signed IM left with patient in room.  

## 2018-03-13 NOTE — Plan of Care (Signed)
Two rapid responses called today. One this morning after correcting a low blood glucose level. The patient had a high blood pressure and was unresponsive. The patient was given PRN blood pressure medication and nausea medication and became stable. The second rapid response was called as the patient became unresponsive. A CT scan of the head was obtained. Labs were obtained. The primary doctor would place and order for neurology to try and rule out seizures.  Problem: Skin Integrity: Goal: Risk for impaired skin integrity will decrease Outcome: Not Progressing   Problem: Education: Goal: Knowledge of General Education information will improve Description Including pain rating scale, medication(s)/side effects and non-pharmacologic comfort measures Outcome: Not Progressing   Problem: Health Behavior/Discharge Planning: Goal: Ability to manage health-related needs will improve Outcome: Not Progressing   Problem: Clinical Measurements: Goal: Ability to maintain clinical measurements within normal limits will improve Outcome: Not Progressing Goal: Will remain free from infection Outcome: Not Progressing Goal: Diagnostic test results will improve Outcome: Not Progressing Goal: Respiratory complications will improve Outcome: Not Progressing Goal: Cardiovascular complication will be avoided Outcome: Not Progressing   Problem: Activity: Goal: Risk for activity intolerance will decrease Outcome: Not Progressing   Problem: Nutrition: Goal: Adequate nutrition will be maintained Outcome: Not Progressing   Problem: Coping: Goal: Level of anxiety will decrease Outcome: Not Progressing   Problem: Elimination: Goal: Will not experience complications related to bowel motility Outcome: Not Progressing Goal: Will not experience complications related to urinary retention Outcome: Not Progressing   Problem: Pain Managment: Goal: General experience of comfort will improve Outcome: Not  Progressing   Problem: Safety: Goal: Ability to remain free from injury will improve Outcome: Not Progressing   Problem: Skin Integrity: Goal: Risk for impaired skin integrity will decrease Outcome: Not Progressing   Problem: Activity: Goal: Risk for activity intolerance will decrease Outcome: Not Progressing   Problem: Nutrition: Goal: Adequate nutrition will be maintained Outcome: Not Progressing   Problem: Coping: Goal: Level of anxiety will decrease Outcome: Not Progressing

## 2018-03-13 NOTE — Progress Notes (Signed)
Another rapid resposne called this evening.  Patient's vitals are stable. No seziure actibvity. SHe is more awake now.   Check head CT EEG NEur consult Carotid dopplers B1, B12 and TSH Tele monitor  CBG fine this evening as well. CBG 129  Time 30 minutes

## 2018-03-13 NOTE — Progress Notes (Signed)
OT Cancellation Note  Patient Details Name: Stephanie Ellis MRN: [id number] DOB: [birth year]   Cancelled Treatment:    Reason Eval/Treat Not Completed: Medical issues which prohibited therapy(Pt. in the process of having  a rapid response intervention.. WIll continue to monitor, and eval at a later time/date when medically appropriate.)  Harrel Carina, MS, OTR/L 03/13/2018, 10:12 AM

## 2018-03-13 NOTE — Progress Notes (Signed)
[place] at [place] NAME: Stephanie Ellis    MR#:  [id number]  DATE OF BIRTH:  1950/10/21  SUBJECTIVE:  S/p amputation on  03/11/18 Had episode of unresponsiveness and low blood sugar this morning. She was found to be hypoglycemic and improved after blood sugar level improvement following injection dextrose.  REVIEW OF SYSTEMS:    Review of Systems  Constitutional: Negative for fever, chills weight loss HENT: Negative for ear pain, nosebleeds, facial swelling, rhinorrhea, neck pain, neck stiffness and ear discharge.   Nasal congestion Respiratory: Negative for cough, shortness of breath, wheezing  Cardiovascular: Negative for chest pain, palpitations and leg swelling.  Gastrointestinal: Negative for heartburn, abdominal pain, vomiting, diarrhea or consitpation Genitourinary: Negative for dysuria, urgency, frequency, hematuria Musculoskeletal: Negative for back pain or joint pain Neurological: Negative for dizziness, seizures, syncope, focal weakness,  numbness and headaches.  Hematological: Does not bruise/bleed easily.  Psychiatric/Behavioral: Negative for hallucinations, confusion, dysphoric mood  Tolerating Diet: yes   DRUG ALLERGIES:   Allergies  Allergen Reactions  . Chlorthalidone Anaphylaxis, Itching and Rash  . Fentanyl Rash  . Midazolam Rash  . Ace Inhibitors Other (See Comments)    Reaction:  Hyperkalemia, agitation   . Angiotensin Receptor Blockers Other (See Comments)    Reaction:  Hyperkalemia, agitation   . Norvasc [Amlodipine] Itching and Rash  . Phenergan [Promethazine Hcl] Anxiety    "antsy, can't sit still"    VITALS:  Blood pressure (!) 144/52, pulse 62, temperature 97.7 F (36.5 C), temperature source Oral, resp. rate 18, height 5' 4"  (1.626 m), weight 56.5 kg, SpO2 100 %.  PHYSICAL EXAMINATION:   Constitutional: Appears well-developed and well-nourished. No distress. HENT: Normocephalic. Marland Kitchen Oropharynx is  clear and moist.  Eyes: Conjunctivae and EOM are normal. PERRLA, no scleral icterus.  Neck: Normal ROM. Neck supple. No JVD. No tracheal deviation. CVS: RRR, S1/S2 +, no murmurs, no gallops, no carotid bruit.  Pulmonary: Effort and breath sounds normal, no stridor, rhonchi, wheezes, rales.  Abdominal: Soft. BS +,  no distension, tenderness, rebound or guarding.  Musculoskeletal: Status post right BKA, left BKA recent with dressing. Neuro: Alert. CN 2-12 grossly intact. No focal deficits. Skin: Skin is warm and dry. No rash noted.  On her back she has dark pigmentation in the central half of the back ,but it does not look inflamed or infective, appears kind of chronic dark discoloration. Psychiatric: Normal mood and affect.    LABORATORY PANEL:   CBC Recent Labs  Lab 03/13/18 0313  WBC 14.1*  HGB 9.3*  HCT 32.0*  PLT 348   ------------------------------------------------------------------------------------------------------------------  Chemistries  Recent Labs  Lab 03/11/18 0457  NA 142  K 3.8  CL 99  CO2 32  GLUCOSE 129*  BUN 21  CREATININE 3.86*  CALCIUM 8.6*  MG 1.9   ------------------------------------------------------------------------------------------------------------------  Cardiac Enzymes Recent Labs  Lab 03/10/18 0931 03/10/18 1559 03/10/18 2221  TROPONINI 0.04* 0.04* 0.04*   ------------------------------------------------------------------------------------------------------------------  RADIOLOGY:  No results found.   ASSESSMENT AND PLAN:   67 y.o. 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  1.    Peripheral arterial disease in bilateral  lower extremities due to Diabetes  Left foot second and third toes with gangrene Nonsalvageable left lower extremity per podiatry Vascular surgery had done left BKA 03/11/18 -Plan to change dressing in OR on Monday ( Day # 5)  2.  Hypertensive urgency: Blood pressure is elevated still  continue home medications including isosorbide and Coreg dose increased to 6.25.  Hydralazine 25 mg is added to the regimen will titrate as needed.  BP stable now.  3.  Diabetes mellitus with complications of PAD and CKD: Continue ADA diet with Lantus and sliding scale As now had hypoglycemia, stopped her Lantus and kept on glucose check without any coverage.  4.  Left eye blurred vision-reporting purulent discharge today  appreciate ophthalmology input in the ED.  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  She had prior laser capsulotomy. Eyedrops recommended at this time.  She will need pars plano vitrectomy-recommended outpatient follow-up after discharge. Add erythromycin ointment to the eye.  5. Acute hypoxic respiratory failure due to pulmonary edema from end-stage renal disease needing dialysis:  Clinically better. Status post hemodialysis. oxygen to be weaned off.  6.  End-stage renal disease on hemodialysis. Had hemodialysis yesterday and today She will continue with her normal dialysis schedule Tuesday, Thursday and Saturday   7.    History of tricuspid endocarditis-with MSSA.  Currently on cefazolin with dialysis.  Continue until 03/13/2018. May stop tomorrow.  8.  Hypoglycemia Patient did not eat much because " she do not like the taste of the food" requesting to have some salt and spices on her food. I Had Stopped Lantus and on glucose check to avoid hypoglycemia. Also allowed to add some salt to her food to have some nutrition.  D/w dr Abigail Butts, vascular surgery  Management plans discussed  with the patient and she is in agreement.  CODE STATUS: full  TOTAL TIME TAKING CARE OF THIS PATIENT: 35 minutes.  During the rapid response seen the patient and spoke to her one daughter in the room and the other on the phone.  POSSIBLE D/C 2-3 days, DEPENDING ON CLINICAL CONDITION.   Vaughan Basta M.D on 03/13/2018 at 3:48 PM  Between 7am to 6pm - Pager - [phone] After 6pm go to [email or web address] - password [email or web address] [facility]  Office  [phone]  CC: Primary care physician; [facility]  Note: This dictation was prepared with Dragon dictation along with smaller phrase technology. Any transcriptional errors that result from this process are unintentional.

## 2018-03-13 NOTE — Progress Notes (Signed)
[facility]  Daily Progress Note   Subjective: 2 Days Post-Op: Left below-the-knee amputation  Patient with improved pain control today.   Objective: Vitals:   03/12/18 1241 03/12/18 1255 03/12/18 2034 03/13/18 0649  BP:  (!) 116/54 (!) 110/49 (!) 162/59  Pulse: 65 63 65 66  Resp: 14 18 18 20   Temp:  98.5 F (36.9 C) 98.5 F (36.9 C) 98.2 F (36.8 C)  TempSrc:  Oral Oral Oral  SpO2: 97% 93% (!) 89% 100%  Weight:      Height:        Intake/Output Summary (Last 24 hours) at 03/13/2018 1121 Last data filed at 03/12/2018 2315 Gross per 24 hour  Intake 120 ml  Output 2018 ml  Net -1898 ml   Physical Exam: A&Ox3, NAD CV: RRR Pulmonary: CTA Bilaterally Abdomen: Soft, Nontender, Nondistended Vascular:  Right BKA: Stump healing well. Patient able to straighten at the knee.  Left BKA: OR dressing in place, clean and dry. Thigh soft.   Laboratory: CBC    Component Value Date/Time   WBC 14.1 (H) 03/13/2018 0313   HGB 9.3 (L) 03/13/2018 0313   HCT 32.0 (L) 03/13/2018 0313   PLT 348 03/13/2018 0313   BMET    Component Value Date/Time   NA 142 03/11/2018 0457   K 3.8 03/11/2018 0457   CL 99 03/11/2018 0457   CO2 32 03/11/2018 0457   GLUCOSE 129 (H) 03/11/2018 0457   BUN 21 03/11/2018 0457   CREATININE 3.86 (H) 03/11/2018 0457   CALCIUM 8.6 (L) 03/11/2018 0457   GFRNONAA 11 (L) 03/11/2018 0457   GFRAA 13 (L) 03/11/2018 0457   Assessment/Planning: The patient is a 67 year old female with a past medical history of severe PAD s/p R BKA now s/p L BKA - Stable 1) OR dressing change on Monday (POD#5) 2) Pain improved with current regimen 3) Seen by both PT and OT 4) H&H OK 5) Reviewed and encouraged the importance of keeping knee joint flexibility. Patient is currently able to straighten both knees.   Discussed with Dr. Ellis Parents Safwan Tomei PA-C 03/13/2018 11:21 AM

## 2018-03-13 NOTE — Progress Notes (Signed)
Pharmacy Antibiotic Note  Stephanie Ellis is a 67 y.o. female admitted on 03/08/2018 with Endocarditis.  Pharmacy has been consulted for cefazolin dosing. Patient has ESRD and is on HD TTS. She was originally started on 1 gram every 24 hours after HD. However, prior to last discharge Dr Delaine Lame started the patient on Ancef 2 grams pHD T/Th and 3 grams pHD on Sa to be completed through 11/22. The dosing was changed on 11/19 to the suggested therapy but the Saturday HD session (11/17) was moved to 11/18 and the dose administered was only one gram. Because of these facts I   Plan: Complete Ancef 2 grams pHD T/Th and 3 grams pHD on Sa to be completed through 11/23 due to the aforementioned lower dose on 11/18  Height: 5' 4"  (162.6 cm) Weight: 124 lb 9 oz (56.5 kg) IBW/kg (Calculated) : 54.7  Temp (24hrs), Avg:98.1 F (36.7 C), Min:97.7 F (36.5 C), Max:98.5 F (36.9 C)  Recent Labs  Lab 03/08/18 1349 03/10/18 0302 03/10/18 0931 03/11/18 0457 03/12/18 0950 03/13/18 0313  WBC 11.1* 9.0 9.4 10.4 12.0* 14.1*  CREATININE 4.83*  --  5.07* 3.86*  --   --     Estimated Creatinine Clearance: 12.2 mL/min (A) (by C-G formula based on SCr of 3.86 mg/dL (H)).    Allergies  Allergen Reactions  . Chlorthalidone Anaphylaxis, Itching and Rash  . Fentanyl Rash  . Midazolam Rash  . Ace Inhibitors Other (See Comments)    Reaction:  Hyperkalemia, agitation   . Angiotensin Receptor Blockers Other (See Comments)    Reaction:  Hyperkalemia, agitation   . Norvasc [Amlodipine] Itching and Rash  . Phenergan [Promethazine Hcl] Anxiety    "antsy, can't sit still"    Antimicrobials this admission: 11/17 ancef >> 11/23  Dose adjustments this admission: Patient is on HD.  Microbiology results: MRAS PCR (-)   Thank you for allowing pharmacy to be a part of this patient's care.  Alaija Ruble A, PharmD 03/13/2018 2:19 PM

## 2018-03-14 ENCOUNTER — Inpatient Hospital Stay: Payer: Medicare Other

## 2018-03-14 DIAGNOSIS — R410 Disorientation, unspecified: Secondary | ICD-10-CM

## 2018-03-14 LAB — CBC
HCT: 29.6 % — ABNORMAL LOW (ref 36.0–46.0)
Hemoglobin: 8.6 g/dL — ABNORMAL LOW (ref 12.0–15.0)
MCH: 30.1 pg (ref 26.0–34.0)
MCHC: 29.1 g/dL — ABNORMAL LOW (ref 30.0–36.0)
MCV: 103.5 fL — ABNORMAL HIGH (ref 80.0–100.0)
Platelets: 387 10*3/uL (ref 150–400)
RBC: 2.86 MIL/uL — ABNORMAL LOW (ref 3.87–5.11)
RDW: 20.2 % — ABNORMAL HIGH (ref 11.5–15.5)
WBC: 16.6 10*3/uL — ABNORMAL HIGH (ref 4.0–10.5)
nRBC: 0 % (ref 0.0–0.2)

## 2018-03-14 LAB — CBC WITH DIFFERENTIAL/PLATELET
Abs Immature Granulocytes: 0.12 10*3/uL — ABNORMAL HIGH (ref 0.00–0.07)
Basophils Absolute: 0 10*3/uL (ref 0.0–0.1)
Basophils Relative: 0 %
Eosinophils Absolute: 0.7 10*3/uL — ABNORMAL HIGH (ref 0.0–0.5)
Eosinophils Relative: 4 %
HCT: 28.3 % — ABNORMAL LOW (ref 36.0–46.0)
Hemoglobin: 8.4 g/dL — ABNORMAL LOW (ref 12.0–15.0)
Immature Granulocytes: 1 %
Lymphocytes Relative: 6 %
Lymphs Abs: 0.9 10*3/uL (ref 0.7–4.0)
MCH: 30.5 pg (ref 26.0–34.0)
MCHC: 29.7 g/dL — ABNORMAL LOW (ref 30.0–36.0)
MCV: 102.9 fL — ABNORMAL HIGH (ref 80.0–100.0)
Monocytes Absolute: 0.8 10*3/uL (ref 0.1–1.0)
Monocytes Relative: 5 %
Neutro Abs: 13.8 10*3/uL — ABNORMAL HIGH (ref 1.7–7.7)
Neutrophils Relative %: 84 %
Platelets: 357 10*3/uL (ref 150–400)
RBC: 2.75 MIL/uL — ABNORMAL LOW (ref 3.87–5.11)
RDW: 20.2 % — ABNORMAL HIGH (ref 11.5–15.5)
WBC: 16.3 10*3/uL — ABNORMAL HIGH (ref 4.0–10.5)
nRBC: 0 % (ref 0.0–0.2)

## 2018-03-14 LAB — GLUCOSE, CAPILLARY
Glucose-Capillary: 131 mg/dL — ABNORMAL HIGH (ref 70–99)
Glucose-Capillary: 123 mg/dL — ABNORMAL HIGH (ref 70–99)
Glucose-Capillary: 109 mg/dL — ABNORMAL HIGH (ref 70–99)
Glucose-Capillary: 156 mg/dL — ABNORMAL HIGH (ref 70–99)
Glucose-Capillary: 136 mg/dL — ABNORMAL HIGH (ref 70–99)

## 2018-03-14 LAB — RENAL FUNCTION PANEL
Albumin: 2.2 g/dL — ABNORMAL LOW (ref 3.5–5.0)
Anion gap: 15 (ref 5–15)
BUN: 36 mg/dL — ABNORMAL HIGH (ref 8–23)
CO2: 29 mmol/L (ref 22–32)
Calcium: 9 mg/dL (ref 8.9–10.3)
Chloride: 95 mmol/L — ABNORMAL LOW (ref 98–111)
Creatinine, Ser: 6.42 mg/dL — ABNORMAL HIGH (ref 0.44–1.00)
GFR calc Af Amer: 7 mL/min — ABNORMAL LOW (ref >60)
GFR calc non Af Amer: 6 mL/min — ABNORMAL LOW (ref >60)
Glucose, Bld: 121 mg/dL — ABNORMAL HIGH (ref 70–99)
Phosphorus: 5.3 mg/dL — ABNORMAL HIGH (ref 2.5–4.6)
Potassium: 4.7 mmol/L (ref 3.5–5.1)
Sodium: 139 mmol/L (ref 135–145)

## 2018-03-14 LAB — BASIC METABOLIC PANEL
Anion gap: 14 (ref 5–15)
BUN: 42 mg/dL — ABNORMAL HIGH (ref 8–23)
CO2: 27 mmol/L (ref 22–32)
Calcium: 8.5 mg/dL — ABNORMAL LOW (ref 8.9–10.3)
Chloride: 94 mmol/L — ABNORMAL LOW (ref 98–111)
Creatinine, Ser: 7.18 mg/dL — ABNORMAL HIGH (ref 0.44–1.00)
GFR calc Af Amer: 6 mL/min — ABNORMAL LOW (ref >60)
GFR calc non Af Amer: 5 mL/min — ABNORMAL LOW (ref >60)
Glucose, Bld: 207 mg/dL — ABNORMAL HIGH (ref 70–99)
Potassium: 4.6 mmol/L (ref 3.5–5.1)
Sodium: 135 mmol/L (ref 135–145)

## 2018-03-14 LAB — LACTIC ACID, PLASMA
Lactic Acid, Venous: 2.3 mmol/L (ref 0.5–1.9)
Lactic Acid, Venous: 0.9 mmol/L (ref 0.5–1.9)
Lactic Acid, Venous: 0.8 mmol/L (ref 0.5–1.9)

## 2018-03-14 LAB — URIC ACID: Uric Acid, Serum: 4.3 mg/dL (ref 2.5–7.1)

## 2018-03-14 LAB — TROPONIN I: Troponin I: 0.21 ng/mL (ref <0.03)

## 2018-03-14 LAB — PHOSPHORUS: Phosphorus: 4.9 mg/dL — ABNORMAL HIGH (ref 2.5–4.6)

## 2018-03-14 LAB — MAGNESIUM: Magnesium: 2.1 mg/dL (ref 1.7–2.4)

## 2018-03-14 MED ORDER — MIDODRINE HCL 5 MG PO TABS
10.0000 mg | ORAL_TABLET | Freq: Once | ORAL | Status: AC
  Administered 2018-03-14: 10 mg via ORAL
  Filled 2018-03-14: qty 2

## 2018-03-14 MED ORDER — NOREPINEPHRINE 4 MG/250ML-% IV SOLN
0.0000 ug/min | INTRAVENOUS | Status: DC
  Administered 2018-03-14: 2 ug/min via INTRAVENOUS

## 2018-03-14 NOTE — Progress Notes (Signed)
Pt's daughter called out for help. RN went into room and pt was not breathing and could not feel a pulse. Compressions were started and code was called. Pt's daughter said that pt had hard candy in her mouth and may have swallowed it. During compressions, hard candy came out of mouth. Code team arrived. Pt was transferred to MRI with RN and AC. PT then transferred to ICU. Report given at bedside to Miguel Rota, RN

## 2018-03-14 NOTE — Progress Notes (Signed)
Pt reports feeling "loosey goosey". VSS. Pt is clammy. MD notified

## 2018-03-14 NOTE — Progress Notes (Signed)
PT Cancellation Note  Patient Details Name: Stephanie Ellis MRN: [id number] DOB: 1950/08/28   Cancelled Treatment:    Reason Eval/Treat Not Completed: Medical issues which prohibited therapy   Pt in bed.  Reports "head is not right." She stated she feels like she did yesterday morning.  Nursing in room to address.  Will hold session at this time.   [name] 03/14/2018, 9:43 AM

## 2018-03-14 NOTE — Consult Note (Addendum)
Name: Stephanie Ellis MRN: [id number] DOB: May 24, 1950     CONSULTATION DATE: 03/08/2018    HISTORY OF PRESENT ILLNESS:  67 years old lady with history of end-stage renal disease on dialysis, MSSA tricuspid endocarditis completed cefazolin treatment on 03/13/2018, HFpEF 50 to 55%, peripheral vascular disease S/P bilateral below-knee Amputation, diabetes mellitus hypertension.  Patient was admitted on 03/08/2018 with volume overload pulmonary edema and hypertensive emergency. Today while on the floor she had an episode of unresponsiveness followed by confusion.  Patient had stat MRI which did not show any acute intracranial abnormalities.  ICU transfer was requested for closed monitoring of neuro status. All history was obtained from rapid response team and EMR. Patient arrived to intensive care unit awake no distress on nasal cannula and disoriented.  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  has a past surgical history that includes Abdominal hysterectomy; Insertion of dialysis catheter; Cardiac catheterization (01/2015); transthoracic echocardiogram (January 2017); Cataract extraction w/PHACO (Left, 01/18/2016); Coronary artery bypass graft (03/28/15); Coronary angioplasty; AV fistula placement (Left, 05/30/2016); Upper Extremity Angiography (Left, 06/27/2016); Colonoscopy; Cataract extraction w/PHACO (Right, 08/01/2016); Insertion  express tube shunt (Right, 08/01/2016); Insertion of ahmed valve (Left, 08/15/2016); Upper Extremity Angiography (Left, 08/22/2016); Esophagogastroduodenoscopy (egd) with propofol (N/A, 10/24/2016); Arteriovenous graft placement (05/2016); Eye surgery (Bilateral); Eye surgery; CAPD insertion (N/A, 10/30/2017); DIALYSIS/PERMA CATHETER REMOVAL (Right, 02/04/2018); Amputation toe (Left, 02/06/2018); TEMPORARY DIALYSIS CATHETER (N/A, 02/06/2018); DIALYSIS/PERMA CATHETER INSERTION (N/A, 02/11/2018); Lower Extremity Angiography (Right, 02/19/2018); Amputation (Right, 02/25/2018); Removal of a dialysis catheter (N/A, 02/25/2018); Vascular surgery; Lower Extremity Angiography (Left, 01/30/2018); and Amputation (Left, 03/11/2018). Prior to Admission medications   Medication Sig Start Date End Date Taking? Authorizing Provider  apixaban (ELIQUIS) 2.5 MG TABS tablet Take 1 tablet (2.5 mg total) by mouth 2 (two) times daily. 02/15/18  Yes Vaughan Basta, MD  aspirin EC 81 MG tablet Take 1 tablet (81 mg total) by mouth daily. 10/19/16  Yes Dustin Flock, MD  atorvastatin (LIPITOR) 80 MG tablet Take 80 mg by mouth at bedtime.  05/05/17  Yes [provider]  calcitRIOL (ROCALTROL) 0.5 MCG capsule Take 0.5 mcg by mouth daily.   Yes [provider]  carvedilol (COREG) 3.125 MG tablet Take 3.125 mg by mouth 2 (two) times daily.    Yes [provider]  ceFAZolin (ANCEF) IVPB Indication:  Osteomyelitis/Tricuspic Endocarditis Last Day of Therapy:  03/13/18 Labs - Once weekly:  CBC/D and CMP, 02/17/18 03/13/18 Yes Vaughan Basta, MD  docusate sodium (COLACE) 100 MG capsule Take 1 capsule (100 mg total) by mouth 2 (two) times daily. 02/15/18  Yes Vaughan Basta, MD  folic acid (FOLVITE) 1 MG tablet Take 1 tablet (1 mg total) by mouth daily. 02/15/18 02/15/19 Yes Vaughan Basta, MD  insulin glargine (LANTUS) 100 unit/mL SOPN Inject 5 Units into the skin at bedtime.   Yes  [provider]  isosorbide mononitrate (IMDUR) 30 MG 24 hr tablet Take 30 mg by mouth daily.    Yes [provider]  midodrine (PROAMATINE) 10 MG tablet Take 1 tablet (10 mg total)  by mouth 3 (three) times a week. Patient taking differently: Take 10 mg by mouth 3 (three) times a week. Take Tuesday, Thursday, and Saturday 02/17/18  Yes Vaughan Basta, MD  mirtazapine (REMERON) 7.5 MG tablet Take 7.5 mg by mouth at bedtime.   Yes [provider]  Multiple Vitamin (MULTIVITAMIN WITH MINERALS) TABS tablet Take 2 tablets by mouth daily. Gummy vitamins   Yes [provider]  Multiple Vitamins-Minerals (PRORENAL + D) TABS Take 1 tablet by mouth daily.   Yes [provider]  nitroGLYCERIN (NITROSTAT) 0.4 MG SL tablet Place 1 tablet (0.4 mg total) under the tongue every 5 (five) minutes as needed for chest pain. 09/05/16  Yes Wieting, Richard, MD  nizatidine (AXID) 150 MG capsule Take 1 capsule (150 mg total) by mouth 2 (two) times daily. 02/15/18  Yes Vaughan Basta, MD  Nutritional Supplements (FEEDING SUPPLEMENT, NEPRO CARB STEADY,) LIQD Take 237 mLs by mouth 2 (two) times daily between meals. 02/15/18  Yes Vaughan Basta, MD  Omega-3 300 MG CAPS Take 2 capsules by mouth daily.   Yes [provider]  ondansetron (ZOFRAN) 4 MG tablet Take 4 mg by mouth every 8 (eight) hours as needed for nausea or vomiting.   Yes [provider]  oxyCODONE (OXY IR/ROXICODONE) 5 MG immediate release tablet Take 1 tablet (5 mg total) by mouth every 6 (six) hours as needed for moderate pain or severe pain. 02/28/18  Yes Fritzi Mandes, MD  Polyethyl Glycol-Propyl Glycol 0.4-0.3 % SOLN Place 1 drop into both eyes 2 (two) times daily.   Yes [provider]  sevelamer carbonate (RENVELA) 800 MG tablet Take 800 mg by mouth 3 (three) times daily with meals.   Yes [provider]  vitamin B-12 (CYANOCOBALAMIN) 1000 MCG tablet Take 1  tablet (1,000 mcg total) by mouth daily. Patient taking differently: Take 500 mcg by mouth daily.  02/15/18  Yes Vaughan Basta, MD   Allergies  Allergen Reactions  . Chlorthalidone Anaphylaxis, Itching and Rash  . Fentanyl Rash  . Midazolam Rash  . Ace Inhibitors Other (See Comments)    Reaction:  Hyperkalemia, agitation   . Angiotensin Receptor Blockers Other (See Comments)    Reaction:  Hyperkalemia, agitation   . Norvasc [Amlodipine] Itching and Rash  . Phenergan [Promethazine Hcl] Anxiety    "antsy, can't sit still"    FAMILY HISTORY:  family history includes Diabetes Mellitus II in her mother; Heart failure in her mother; Pancreatic cancer in her father. SOCIAL HISTORY:  reports that she has never smoked. She has never used smokeless tobacco. She reports that she does not drink alcohol or use drugs.  REVIEW OF SYSTEMS:   Unable to obtain due to critical illness   VITAL SIGNS: Temp:  [97.6 F (36.4 C)-98.4 F (36.9 C)] 98.2 F (36.8 C) (11/23 1651) Pulse Rate:  [57-89] 73 (11/23 1700) Resp:  [0-18] 0 (11/23 1700) BP: (97-249)/(46-122) 97/60 (11/23 1605) SpO2:  [98 %-100 %] 100 % (11/23 1700) Weight:  [56 kg] 56 kg (11/23 1651)  Physical Examination:  Awake, disoriented to time in no acute focal motor deficits.  Detailed neuro exam as per neurology Tolerating nasal cannula, no distress, able to talk in full sentences, bilateral equal air entry no adventitious sounds S1 & S2 audible with no murmur Benign abdominal exam with a normal processes Bilateral below-knee amputation was dressed left amputation stump Lt IJ tunneled cath for dialysis access   ASSESSMENT / PLAN:  Altered mental status with confusion  and brief episodes of unresponsiveness.  No acute abnormalities on MRI.  Carotid ultrasound moderate bilateral carotid atherosclerosis and no hemodynamically significant ICA stenosis. possible toxic metabolic encephalopathy -Monitor neuro status and  management as per neurology  Acute respiratory failure tolerating nasal cannula -Monitor O2 sat and work of breathing  Atelectasis.  Bibasilar airspace disease (improved) -Watch off ABX.  Monitor CXR + CBC + FiO2 -Consider starting ABX if develops SIRS.  Tricuspid valve endocarditis with MSSA bacteremia completed cefazolin on 03/13/2018.  Echo 02/04/2018 LVEF 50 to 55%, regional wall abnormalities cannot be excluded, grade 1 diastolic dysfunction and tricuspid valve vegetation -Follow his repeat blood culture and echocardiogram   ESRD on HD and secondary hyperparathyroidism -Management as per renal  Peripheral vascular disease status post bilateral below-knee amputation  Diabetes mellitus -Optimize glycemic control  Hypertension -Optimize antihypertensives and monitor hemodynamics  Anemia -Keep hemoglobin more than 7 g/dL  Full code  DVT & GI prophylaxis.  Continue with supportive care  Critical care time 50 minutes

## 2018-03-14 NOTE — Progress Notes (Signed)
Responded to code blue.  Gave breaths with ambu bag @ 100% for approx. Less than 5 minutes when patient's spontaneous respirations returned and she began to talk.  Patient placed back on her [place].

## 2018-03-14 NOTE — Progress Notes (Signed)
Neurology: 67 y/o female being evaluated for brief periods of unresponsiveness.   No clear description of seizure type of activity.   - On examination no clear focality on exam and pt can answer her name but is confused on date and time - Multiple medical problems including metabolic which could be contributing to current problems - MRI reviewed and no abnormalities, contrast could not be obtained due to hx of ESRD on HD - Not convinced this is seizure related but possibly delirium/fluid shifts - No further testing from neurological stand point at this time.   Leotis Pain

## 2018-03-14 NOTE — Progress Notes (Signed)
Pre HD assessment    03/14/18 2030  Neurological  Level of Consciousness Alert  Orientation Level Oriented X4  Respiratory  Respiratory Pattern Regular;Unlabored  Chest Assessment Chest expansion symmetrical  Cardiac  ECG Monitor Yes  Cardiac Rhythm NSR  Vascular  R Radial Pulse +2  L Radial Pulse +2  Edema Generalized  Integumentary  Integumentary (WDL) X  Skin Color Appropriate for ethnicity  Musculoskeletal  Musculoskeletal (WDL) X  Generalized Weakness Yes  Assistive Device None  GU Assessment  Genitourinary (WDL) X  Genitourinary Symptoms  (HD)  Psychosocial  Psychosocial (WDL) WDL  Patient Behaviors Cooperative;Calm;Appropriate for situation  Needs Expressed Physical  Psychosocial Additional Assessments Visitor behavior  Visitor Behavior Supportive;Appropriate for situation;Cooperative  Emotional support given Given to patient;Given to patient's visitors

## 2018-03-14 NOTE — Evaluation (Signed)
Occupational Therapy Evaluation Patient Details Name: Stephanie Ellis MRN: [id number] DOB: [birth year] Today's Date: 03/14/2018    History of Present Illness Pt is a 67 y/o F who presented from [facility] with SOB and headache.  Pt noted to have elevated BP readings.  Pt with recent R BKA.  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  Pt with L eye blurred ision with known h/o hemorrhage L eye as well as new purulent discharge.  Pt's PMH includes CABG x3, MI, ESRD.     Clinical Impression   Patient was lying in bed with HOB all the way up when OT arrived. Daughter at bedside. Patient was pleasant and willing to work with OT, but delayed response and intermittent confusion noted throughout session. Patient attempted to perform all tasks asked of her, but frequently needed repeated cues and tactile cues to perform tasks. Stated that it took her brain a minute to figure out what to do. Patient would benefit from continued OT services at this setting to work on balance, safety, and use of AE for increased independence with ADL tasks.    Follow Up Recommendations  SNF    Equipment Recommendations       Recommendations for Other Services       Precautions / Restrictions Precautions Precautions: Fall;Other (comment) Precaution Comments: permcath L chest; educated pt and daughter in proper positioning of LLE to avoid L knee F for proper healing using pillows to elevate distal LLE.   Restrictions Weight Bearing Restrictions: Yes LLE Weight Bearing: Non weight bearing      Mobility Bed Mobility Overal bed mobility: Needs Assistance                Transfers                      Balance                                           ADL either performed or assessed with clinical judgement   ADL Overall ADL's : Needs assistance/impaired Eating/Feeding: Set up;Supervision/ safety   Grooming: Set up;Supervision/safety;Cueing for sequencing           Upper Body  Dressing : Minimal assistance;Cueing for sequencing;Moderate assistance Upper Body Dressing Details (indicate cue type and reason): Sitting in bed with HOB raised to provide back support Lower Body Dressing: Total assistance;Cueing for sequencing;Bed level Lower Body Dressing Details (indicate cue type and reason): Patient was able to lift RLE with tactile and verbal cuing. Needed Mod A to initiate movement of LLE off the bed. Difficulty with shifting weight due to pain in LLE stump with movement. Toilet Transfer: Total assistance                   Vision Patient Visual Report: No change from baseline       Perception     Praxis      Pertinent Vitals/Pain Pain Assessment: Faces Faces Pain Scale: Hurts a little bit Pain Location: LLE during movement that put pressure on Left stump Pain Intervention(s): Limited activity within patient's tolerance;Monitored during session;Repositioned     Hand Dominance Right   Extremity/Trunk Assessment Upper Extremity Assessment Upper Extremity Assessment: Generalized weakness   Lower Extremity Assessment Lower Extremity Assessment: Defer to PT evaluation RLE Deficits / Details: recent R BKA       Communication Communication Communication:  HOH   Cognition Arousal/Alertness: Awake/alert Behavior During Therapy: WFL for tasks assessed/performed Overall Cognitive Status: Impaired/Different from baseline Area of Impairment: Orientation;Attention;Memory;Following commands;Awareness;Problem solving                 Orientation Level: Disoriented to;Time Current Attention Level: Selective Memory: Decreased short-term memory Following Commands: Follows one step commands with increased time     Problem Solving: Slow processing;Decreased initiation;Difficulty sequencing;Requires verbal cues;Requires tactile cues General Comments: Daughter reports her altered cognition is better than yesterday, but reports pt's cognition WNL at  baseline.    General Comments       Exercises     Shoulder Instructions      Home Living Family/patient expects to be discharged to:: Skilled nursing facility Living Arrangements: Children                               Additional Comments: Daughter reports pt was living in a not very accessible mobile home prior to most recent admission but has been at [facility].  Daughter is hopeful that pt will be able to transition to ALF at [facility] after SNF at [facility].       Prior Functioning/Environment Level of Independence: Needs assistance  Gait / Transfers Assistance Needed: Pt says "I scooted around" at [facility]. Daughter reports pt would scoot in WC using LLE.  Pt required heavy assist to transfer with squat pivot to WC from bed. Pt denies any falls in the past 3 months.  ADL's / Homemaking Assistance Needed: Pt required assist with ADLs at [facility].    Comments: Has not been ambulating much since last hospitalization 2-3 weeks ago        OT Problem List: Decreased strength;Decreased activity tolerance;Decreased knowledge of use of DME or AE;Impaired balance (sitting and/or standing);Decreased safety awareness      OT Treatment/Interventions: Self-care/ADL training;DME and/or AE instruction;Therapeutic activities;Balance training;Therapeutic exercise;Patient/family education    OT Goals(Current goals can be found in the care plan section) Acute Rehab OT Goals Patient Stated Goal: get around OT Goal Formulation: With patient Time For Goal Achievement: 03/28/18 Potential to Achieve Goals: Fair  OT Frequency: Min 2X/week   Barriers to D/C: Inaccessible home environment;Decreased caregiver support  Patient requires 24 hour supervision and assist for basic ADL tasks       Co-evaluation              AM-PAC OT "6 Clicks" Daily Activity     Outcome Measure Help from another person eating meals?: A Little Help from another  person taking care of personal grooming?: A Little Help from another person toileting, which includes using toliet, bedpan, or urinal?: Total Help from another person bathing (including washing, rinsing, drying)?: Total Help from another person to put on and taking off regular upper body clothing?: A Little Help from another person to put on and taking off regular lower body clothing?: Total 6 Click Score: 12   End of Session    Activity Tolerance: Patient tolerated treatment well Patient left: in bed;with family/visitor present;with call bell/phone within reach;with bed alarm set  OT Visit Diagnosis: Muscle weakness (generalized) (M62.81);Other symptoms and signs involving cognitive function                Time: 1445-1512 OT Time Calculation (min): 27 min Charges:  OT General Charges $OT Visit: 1 Visit OT Evaluation $OT Eval Moderate Complexity: 1 Mod OT Treatments $Self Care/Home Management :  8-22 mins  Amie Portland, OTR/L  Barnes Florek L 03/14/2018, 3:49 PM

## 2018-03-14 NOTE — Progress Notes (Signed)
   03/14/18 1545  Clinical Encounter Type  Visited With Family;Patient not available  Visit Type Follow-up;Spiritual support;Code;Critical Care  Referral From Nurse  Consult/Referral To Chaplain  Spiritual Encounters  Spiritual Needs Prayer;Emotional;Other (Comment)   [place] responded to a code blue page. The patient became alert very quickly. The patient's family member was in the hall on the phone upon my arrival. I offered emotional support and pastoral presences. The code blue was called off.

## 2018-03-14 NOTE — Progress Notes (Signed)
[facility]  ROUNDING NOTE   Subjective:   Patient continues to have loss consciousness episodes. Dialysis held this morning. In order to get work up.   Patient complains of pain from her left lower extremity incision site.   Objective:  Vital signs in last 24 hours:  Temp:  [97.6 F (36.4 C)-98.4 F (36.9 C)] 97.8 F (36.6 C) (11/23 1126) Pulse Rate:  [57-79] 59 (11/23 1126) Resp:  [17-18] 17 (11/23 1126) BP: (117-158)/(46-68) 139/50 (11/23 1126) SpO2:  [98 %-100 %] 100 % (11/23 1126)  Weight change:  Filed Weights   03/11/18 1058 03/12/18 0917 03/12/18 1238  Weight: 58.9 kg 58.4 kg 56.5 kg    Intake/Output: I/O last 3 completed shifts: In: 120 [P.O.:120] Out: 0    Intake/Output this shift:  No intake/output data recorded.  Physical Exam: General: NAD,   Head: Normocephalic, atraumatic. Moist oral mucosal membranes  Eyes: Anicteric, PERRL  Neck: Supple, trachea midline  Lungs:  crackles  Heart: Regular rate and rhythm  Abdomen:  Soft, nontender,   Extremities:  bilateral BKA - left dressings are clean  Neurologic: Nonfocal, moving all four extremities  Skin: No lesions  Access: Left IJ permcath    Basic Metabolic Panel: Recent Labs  Lab 03/08/18 1349 03/10/18 0931 03/11/18 0457 03/13/18 1803 03/13/18 1815 03/14/18 1029  NA 140 141 142  --  136 139  K 4.0 3.9 3.8  --  4.9 4.7  CL 99 100 99  --  97* 95*  CO2 31 31 32  --  26 29  GLUCOSE 76 131* 129*  --  143* 121*  BUN 23 23 21   --  25* 36*  CREATININE 4.83* 5.07* 3.86*  --  5.08* 6.42*  CALCIUM 9.0 8.5* 8.6*  --  8.6* 9.0  MG  --   --  1.9 2.1  --   --   PHOS  --   --   --   --   --  5.3*    Liver Function Tests: Recent Labs  Lab 03/14/18 1029  ALBUMIN 2.2*   No results for input(s): LIPASE, AMYLASE in the last 168 hours. No results for input(s): AMMONIA in the last 168 hours.  CBC: Recent Labs  Lab 03/10/18 0931 03/11/18 0457 03/12/18 0950 03/13/18 0313 03/14/18 1029   WBC 9.4 10.4 12.0* 14.1* 16.6*  HGB 8.3* 8.6* 7.7* 9.3* 8.6*  HCT 28.0* 29.4* 26.5* 32.0* 29.6*  MCV 102.9* 103.2* 103.5* 103.9* 103.5*  PLT 353 360 351 348 387    Cardiac Enzymes: Recent Labs  Lab 03/08/18 1349 03/08/18 1630 03/10/18 0931 03/10/18 1559 03/10/18 2221  TROPONINI 0.05* 0.05* 0.04* 0.04* 0.04*    BNP: Invalid input(s): POCBNP  CBG: Recent Labs  Lab 03/13/18 2127 03/13/18 2344 03/14/18 0736 03/14/18 0904 03/14/18 1126  GLUCAP 148* 143* 131* 48* 109*    Microbiology: Results for orders placed or performed during the hospital encounter of 03/08/18  MRSA PCR Screening     Status: None   Collection Time: 03/10/18  8:24 AM  Result Value Ref Range Status   MRSA by PCR NEGATIVE NEGATIVE Final    Comment:        The GeneXpert MRSA Assay (FDA approved for NASAL specimens only), is one component of a comprehensive MRSA colonization surveillance program. It is not intended to diagnose MRSA infection nor to guide or monitor treatment for MRSA infections. Performed at [facility], [address], [place], [place] [address]  Coagulation Studies: No results for input(s): LABPROT, INR in the last 72 hours.  Urinalysis: No results for input(s): COLORURINE, LABSPEC, PHURINE, GLUCOSEU, HGBUR, BILIRUBINUR, KETONESUR, PROTEINUR, UROBILINOGEN, NITRITE, LEUKOCYTESUR in the last 72 hours.  Invalid input(s): APPERANCEUR    Imaging: Dg Chest 2 View  Result Date: 03/14/2018 CLINICAL DATA:  Hypoxia EXAM: CHEST - 2 VIEW COMPARISON:  03/08/2018 and prior radiographs FINDINGS: Mild cardiomegaly and CABG changes again noted. A LEFT IJ central venous catheter with tip overlying the SUPERIOR cavoatrial junction again noted. Decreased pulmonary vascular congestion and improved bibasilar aeration noted. Trace bilateral pleural effusions are present. No pneumothorax. IMPRESSION: Decreased pulmonary vascular congestion and improved bibasilar aeration. No other  significant change. Electronically Signed   By: Stephanie Ellis M.D.   On: 03/14/2018 09:08   Ct Head Wo Contrast  Result Date: 03/13/2018 CLINICAL DATA:  67 year old female with possible seizure like activity. Unresponsiveness. EXAM: CT HEAD WITHOUT CONTRAST TECHNIQUE: Contiguous axial images were obtained from the base of the skull through the vertex without intravenous contrast. COMPARISON:  Head CT 09/27/2017. FINDINGS: Brain: Patchy areas of decreased attenuation are noted throughout the deep and periventricular white matter of the cerebral hemispheres bilaterally, compatible with mild chronic microvascular ischemic disease. No evidence of acute infarction, hemorrhage, hydrocephalus, extra-axial collection or mass lesion/mass effect. Vascular: No hyperdense vessel or unexpected calcification. Skull: Normal. Negative for fracture or focal lesion. Sinuses/Orbits: Complete opacification of the left sphenoid sinus. Areas of mucosal thickening in the posterior left ethmoidal sinuses and right sphenoid sinus. Other: None. IMPRESSION: 1. No acute intracranial abnormalities. 2. Mild chronic microvascular ischemic changes in the cerebral white matter, as above. 3. Chronic paranasal sinus disease, slightly worsened compared to the prior study, as above. Electronically Signed   By: Stephanie Ellis M.D.   On: 03/13/2018 20:37   US Carotid Bilateral  Result Date: 03/14/2018 CLINICAL DATA:  Cerebral infarct, hypertension, hyperlipidemia EXAM: BILATERAL CAROTID DUPLEX ULTRASOUND TECHNIQUE: Pearline Cables scale imaging, color Doppler and duplex ultrasound were performed of bilateral carotid and vertebral arteries in the neck. COMPARISON:  None. FINDINGS: Criteria: Quantification of carotid stenosis is based on velocity parameters that correlate the residual internal carotid diameter with NASCET-based stenosis levels, using the diameter of the distal internal carotid lumen as the denominator for stenosis measurement. The following  velocity measurements were obtained: RIGHT ICA: 146/32 cm/sec CCA: 96/28 cm/sec SYSTOLIC ICA/CCA RATIO:  1.6 ECA: 85 cm/sec LEFT ICA: 137/26 cm/sec CCA: 366/29 cm/sec SYSTOLIC ICA/CCA RATIO:  1.2 ECA: 102 cm/sec RIGHT CAROTID ARTERY: Scattered moderate partially calcified right carotid bifurcation atherosclerosis. Despite this, there is no hemodynamically significant right ICA stenosis, velocity elevation, or turbulent flow. Degree of narrowing estimated at less than 50%. RIGHT VERTEBRAL ARTERY:  Antegrade LEFT CAROTID ARTERY: Similar scattered moderate left carotid bifurcation atherosclerosis. Despite this, there is no hemodynamically significant left ICA stenosis, velocity elevation, turbulent flow. Degree of narrowing also less than 50%. LEFT VERTEBRAL ARTERY:  Antegrade. Included views of the left internal jugular vein demonstrates hypoechoic intraluminal thrombus appearing acute and nearly occlusive. Patient does have a tunneled hemodialysis catheter on this side as well. IMPRESSION: Moderate bilateral carotid atherosclerosis. No hemodynamically significant ICA stenosis. Degree of narrowing less than 50% bilaterally by ultrasound criteria. Patent antegrade vertebral flow bilaterally Left internal jugular vein thrombosis, suspect related to tunneled left hemo dialysis catheter. Electronically Signed   By: Stephanie Mages.  Ellis M.D.   On: 03/14/2018 08:17     Medications:    . apixaban  2.5 mg Oral BID  .  aspirin EC  81 mg Oral Daily  . atorvastatin  80 mg Oral QHS  . brimonidine  1 drop Both Eyes TID  . carvedilol  6.25 mg Oral BID  . Chlorhexidine Gluconate Cloth  6 each Topical Q0600  . docusate sodium  100 mg Oral BID  . epoetin (EPOGEN/PROCRIT) injection  10,000 Units Intravenous Q T,Th,Sa-HD  . erythromycin   Left Eye Q6H  . famotidine  20 mg Oral Daily  . feeding supplement (NEPRO CARB STEADY)  237 mL Oral BID BM  . feeding supplement (PRO-STAT SUGAR FREE 64)  30 mL Oral TID  . folic acid  1 mg Oral  Daily  . gabapentin  100 mg Oral QHS  . hydrALAZINE  25 mg Oral Q8H  . isosorbide mononitrate  30 mg Oral Daily  . mirtazapine  7.5 mg Oral QHS  . multivitamin  1 tablet Oral Daily  . omega-3 acid ethyl esters  1 capsule Oral Daily  . polyethylene glycol  17 g Oral Daily  . polyvinyl alcohol  1 drop Both Eyes BID  . sevelamer carbonate  800 mg Oral TID WC  . thiamine  100 mg Oral Daily  . timolol  1 drop Both Eyes TID  . vitamin C  500 mg Oral BID   acetaminophen **OR** acetaminophen, hydrALAZINE, LORazepam **OR** LORazepam, morphine injection, ondansetron **OR** ondansetron (ZOFRAN) IV, oxyCODONE, sodium chloride  Assessment/ Plan:  [name] is a 67 y.o. 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  [facility] TTS hemodialysis RIJ permcath  1. End Stage Renal Disease with crackles on examination TTS schedule Hemodialysis for later today - orders prepared.   2. Ischemic limb with osteomyelitis and endocarditis Completed course of  Cefazolin    3. Anemia of chronic kidney disease:   - EPO 10000 units with hemodialysis treatment TTS  4. Hypertension:  - carvedilol, hydralazine and isosorbide mononitrate  5. Secondary Hyperparathyroidism: holding sevelamer due to hypophosphatemia. Phos and calcium at goal today.   6. Syncope: concern for seizures - EEG and MRI ordered.    LOS: 6 Bj Morlock 11/23/20191:40 PM

## 2018-03-14 NOTE — Progress Notes (Signed)
HD tx start    03/14/18 2051  Vital Signs  Pulse Rate 68  Pulse Rate Source Monitor  Resp 18  BP (!) 135/49  BP Location Right Arm  BP Method Automatic  Patient Position (if appropriate) Lying  Oxygen Therapy  SpO2 100 %  O2 Device Nasal Cannula  O2 Flow Rate (L/min) 2 L/min  During Hemodialysis Assessment  Blood Flow Rate (mL/min) 400 mL/min  Arterial Pressure (mmHg) -220 mmHg  Venous Pressure (mmHg) 180 mmHg  Transmembrane Pressure (mmHg) 60 mmHg  Ultrafiltration Rate (mL/min) 1000 mL/min  Dialysate Flow Rate (mL/min) 600 ml/min  Conductivity: Machine  13.6  HD Safety Checks Performed Yes  Dialysis Fluid Bolus Normal Saline  Bolus Amount (mL) 250 mL  Intra-Hemodialysis Comments Tx initiated  Hemodialysis Catheter Left Subclavian Double-lumen  Placement Date: 02/11/18   Placed prior to admission: No  Orientation: Left  Access Location: Subclavian  Hemodialysis Catheter Type: Double-lumen  Blue Lumen Status Infusing  Red Lumen Status Infusing

## 2018-03-14 NOTE — Progress Notes (Signed)
PT Cancellation Note  Patient Details Name: Stephanie Ellis MRN: [id number] DOB: 23-Jun-1950   Cancelled Treatment:    Reason Eval/Treat Not Completed: Medical issues which prohibited therapy(Per chart review, patient transferred to CCU status post rapid response.  Per guidelines, will require new orders to resume PT services.  Will complete orders at this time.  Please re-consult as medically appropriate.)   Dmiya Malphrus H. Owens Shark, PT, DPT, NCS 03/14/18, 10:51 PM [phone]

## 2018-03-14 NOTE — Progress Notes (Signed)
[facility] [place] NAME: Adalie Mand    MR#:  [id number]  DATE OF BIRTH:  [birth year]  SUBJECTIVE:  S/p amputation on  03/11/18 Patient is having intermittent episodes of delirious versus brief epiepisodes of unresponsiveness.  No active seizure activity no history of seizures in the past Not hypoglycemic.  REVIEW OF SYSTEMS:    Review of Systems  Constitutional: Negative for fever, chills weight loss HENT: Negative for ear pain, nosebleeds, facial swelling, rhinorrhea, neck pain, neck stiffness and ear discharge.   Nasal congestion Respiratory: Negative for cough, shortness of breath, wheezing  Cardiovascular: Negative for chest pain, palpitations and leg swelling.  Gastrointestinal: Negative for heartburn, abdominal pain, vomiting, diarrhea or consitpation Genitourinary: Negative for dysuria, urgency, frequency, hematuria Musculoskeletal: Negative for back pain or joint pain Neurological: Negative for dizziness, seizures, syncope, focal weakness,  numbness and headaches.  Hematological: Does not bruise/bleed easily.  Psychiatric/Behavioral: Negative for hallucinations, confusion, dysphoric mood  Tolerating Diet: yes   DRUG ALLERGIES:   Allergies  Allergen Reactions  . Chlorthalidone Anaphylaxis, Itching and Rash  . Fentanyl Rash  . Midazolam Rash  . Ace Inhibitors Other (See Comments)    Reaction:  Hyperkalemia, agitation   . Angiotensin Receptor Blockers Other (See Comments)    Reaction:  Hyperkalemia, agitation   . Norvasc [Amlodipine] Itching and Rash  . Phenergan [Promethazine Hcl] Anxiety    "antsy, can't sit still"    VITALS:  Blood pressure (!) 139/50, pulse (!) 59, temperature 97.8 F (36.6 C), temperature source Oral, resp. rate 17, height 5' 4"  (1.626 m), weight 56.5 kg, SpO2 100 %.  PHYSICAL EXAMINATION:   Constitutional: Appears well-developed and well-nourished. No distress. HENT: Normocephalic. Marland Kitchen  Oropharynx is clear and moist.  Eyes: Conjunctivae and EOM are normal. PERRLA, no scleral icterus.  Neck: Normal ROM. Neck supple. No JVD. No tracheal deviation. CVS: RRR, S1/S2 +, no murmurs, no gallops, no carotid bruit.  Pulmonary: Effort and breath sounds normal, no stridor, rhonchi, wheezes, rales.  Abdominal: Soft. BS +,  no distension, tenderness, rebound or guarding.  Musculoskeletal: Status post right BKA, left BKA recent with dressing. Neuro: Alert. CN 2-12 grossly intact. No focal deficits. Skin: Skin is warm and dry. No rash noted.  On her back she has dark pigmentation in the central half of the back ,but it does not look inflamed or infective, appears kind of chronic dark discoloration. Psychiatric: Normal mood and affect.    LABORATORY PANEL:   CBC Recent Labs  Lab 03/14/18 1029  WBC 16.6*  HGB 8.6*  HCT 29.6*  PLT 387   ------------------------------------------------------------------------------------------------------------------  Chemistries  Recent Labs  Lab 03/13/18 1803  03/14/18 1029  NA  --    < > 139  K  --    < > 4.7  CL  --    < > 95*  CO2  --    < > 29  GLUCOSE  --    < > 121*  BUN  --    < > 36*  CREATININE  --    < > 6.42*  CALCIUM  --    < > 9.0  MG 2.1  --   --    < > = values in this interval not displayed.   ------------------------------------------------------------------------------------------------------------------  Cardiac Enzymes Recent Labs  Lab 03/10/18 0931 03/10/18 1559 03/10/18 2221  TROPONINI 0.04* 0.04* 0.04*   ------------------------------------------------------------------------------------------------------------------  RADIOLOGY:  Dg Chest 2 View  Result Date: 03/14/2018 CLINICAL  DATA:  Hypoxia EXAM: CHEST - 2 VIEW COMPARISON:  03/08/2018 and prior radiographs FINDINGS: Mild cardiomegaly and CABG changes again noted. A LEFT IJ central venous catheter with tip overlying the SUPERIOR cavoatrial junction  again noted. Decreased pulmonary vascular congestion and improved bibasilar aeration noted. Trace bilateral pleural effusions are present. No pneumothorax. IMPRESSION: Decreased pulmonary vascular congestion and improved bibasilar aeration. No other significant change. Electronically Signed   By: Margarette Canada M.D.   On: 03/14/2018 09:08   Ct Head Wo Contrast  Result Date: 03/13/2018 CLINICAL DATA:  67 year old female with possible seizure like activity. Unresponsiveness. EXAM: CT HEAD WITHOUT CONTRAST TECHNIQUE: Contiguous axial images were obtained from the base of the skull through the vertex without intravenous contrast. COMPARISON:  Head CT 09/27/2017. FINDINGS: Brain: Patchy areas of decreased attenuation are noted throughout the deep and periventricular white matter of the cerebral hemispheres bilaterally, compatible with mild chronic microvascular ischemic disease. No evidence of acute infarction, hemorrhage, hydrocephalus, extra-axial collection or mass lesion/mass effect. Vascular: No hyperdense vessel or unexpected calcification. Skull: Normal. Negative for fracture or focal lesion. Sinuses/Orbits: Complete opacification of the left sphenoid sinus. Areas of mucosal thickening in the posterior left ethmoidal sinuses and right sphenoid sinus. Other: None. IMPRESSION: 1. No acute intracranial abnormalities. 2. Mild chronic microvascular ischemic changes in the cerebral white matter, as above. 3. Chronic paranasal sinus disease, slightly worsened compared to the prior study, as above. Electronically Signed   By: Vinnie Langton M.D.   On: 03/13/2018 20:37   US Carotid Bilateral  Result Date: 03/14/2018 CLINICAL DATA:  Cerebral infarct, hypertension, hyperlipidemia EXAM: BILATERAL CAROTID DUPLEX ULTRASOUND TECHNIQUE: Pearline Cables scale imaging, color Doppler and duplex ultrasound were performed of bilateral carotid and vertebral arteries in the neck. COMPARISON:  None. FINDINGS: Criteria: Quantification of  carotid stenosis is based on velocity parameters that correlate the residual internal carotid diameter with NASCET-based stenosis levels, using the diameter of the distal internal carotid lumen as the denominator for stenosis measurement. The following velocity measurements were obtained: RIGHT ICA: 146/32 cm/sec CCA: 28/36 cm/sec SYSTOLIC ICA/CCA RATIO:  1.6 ECA: 85 cm/sec LEFT ICA: 137/26 cm/sec CCA: 629/47 cm/sec SYSTOLIC ICA/CCA RATIO:  1.2 ECA: 102 cm/sec RIGHT CAROTID ARTERY: Scattered moderate partially calcified right carotid bifurcation atherosclerosis. Despite this, there is no hemodynamically significant right ICA stenosis, velocity elevation, or turbulent flow. Degree of narrowing estimated at less than 50%. RIGHT VERTEBRAL ARTERY:  Antegrade LEFT CAROTID ARTERY: Similar scattered moderate left carotid bifurcation atherosclerosis. Despite this, there is no hemodynamically significant left ICA stenosis, velocity elevation, turbulent flow. Degree of narrowing also less than 50%. LEFT VERTEBRAL ARTERY:  Antegrade. Included views of the left internal jugular vein demonstrates hypoechoic intraluminal thrombus appearing acute and nearly occlusive. Patient does have a tunneled hemodialysis catheter on this side as well. IMPRESSION: Moderate bilateral carotid atherosclerosis. No hemodynamically significant ICA stenosis. Degree of narrowing less than 50% bilaterally by ultrasound criteria. Patent antegrade vertebral flow bilaterally Left internal jugular vein thrombosis, suspect related to tunneled left hemo dialysis catheter. Electronically Signed   By: Jerilynn Mages.  Shick M.D.   On: 03/14/2018 08:17     ASSESSMENT AND PLAN:   67 y.o. 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  1.    Peripheral arterial disease in bilateral lower extremities due to Diabetes  Left foot second and third toes with gangrene Nonsalvageable left lower extremity per podiatry Vascular surgery had done left BKA 03/11/18 -Plan to change dressing in OR on Monday ( Day # 5)  2.    Intermittent episodes of unresponsiveness Rule out seizures EEG pending Neurology consult placed and discussed with Dr. Irish Elders CT scan headis negative Carotid Dopplers with no significant stenosis MRI of the brain with contrast could not be ordered as patient is a dialysis patient Neurochecks  3.  Diabetes mellitus with complications of PAD and CKD: Continue ADA diet with Lantus and sliding scale As now had hypoglycemia, stopped her Lantus and kept on glucose check without any coverage.  4.  Left eye blurred vision-reporting purulent discharge today  appreciate ophthalmology input in the ED.  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  She had prior laser capsulotomy. Eyedrops recommended at this time.  She will need pars plano vitrectomy-recommended outpatient follow-up after discharge. Add erythromycin ointment to the eye.  5. Acute hypoxic respiratory failure due to pulmonary edema from end-stage renal disease needing dialysis:  Clinically better. Status post hemodialysis. oxygen to be weaned off.  6.  End-stage renal disease with fluid overload Uric acid level is in the normal range on hemodialysis.  For hemodialysis today She will continue with her normal dialysis schedule Tuesday, Thursday and Saturday   7.    History of tricuspid endocarditis-with MSSA.  Currently on cefazolin with dialysis.  Continue until 03/13/2018. May stop tomorrow.  8.  Hypertensive urgency resolved Hydralazine is added to the regimen titrate as needed  9  hypoglycemia Discontinued Lantus and on glucose check to avoid hypoglycemia. Also allowed to add some salt to her food to have some nutrition.  D/w dr Abigail Butts, neurology  Management plans discussed with the patient and she is in agreement.  CODE STATUS: full  TOTAL TIME TAKING CARE OF THIS PATIENT: 35 minutes.  During the rapid response seen the patient and spoke to her one daughter in the room and the other on the phone.  POSSIBLE D/C 2-3 days, DEPENDING ON CLINICAL CONDITION.   [name] M.D on 03/14/2018 at 2:02 PM  Between 7am to 6pm - Pager - [phone] After 6pm go to [email or web address] - password [email or web address] [facility]  Office  [phone]  CC: Primary care physician; No primary care provider on file.  Note: This dictation was prepared with Dragon dictation along with smaller phrase technology. Any transcriptional errors that result from this process are unintentional.

## 2018-03-14 NOTE — Progress Notes (Signed)
Pre HD assessment   03/14/18 2029  Vital Signs  Temp 98.5 F (36.9 C)  Temp Source Oral  Pulse Rate 70  Pulse Rate Source Monitor  Resp 20  BP 120/78  BP Location Right Arm  BP Method Automatic  Patient Position (if appropriate) Lying  Oxygen Therapy  SpO2 100 %  O2 Device Nasal Cannula  O2 Flow Rate (L/min) 2 L/min  Pain Assessment  Pain Scale 0-10  Pain Score 0  Dialysis Weight  Weight 56.8 kg  Type of Weight Pre-Dialysis  Time-Out for Hemodialysis  What Procedure? HD  Pt Identifiers(min of two) First/Last Name;MRN/Account#  Correct Site? Yes  Correct Side? Yes  Correct Procedure? Yes  Consents Verified? Yes  Rad Studies Available? N/A  Safety Precautions Reviewed? Yes  Engineer, civil (consulting) Number  (3A)  Station Number  (Bedside ICU 20)  UF/Alarm Test Passed  Conductivity: Meter 13.6  Conductivity: Machine  13.7  pH 7.6  Reverse Osmosis WRO #1  Normal Saline Lot Number [id number]  Dialyzer Lot Number [id number]  Disposable Set Lot Number [id number]  Machine Temperature 98.6 F (37 C)  Musician and Audible Yes  Blood Lines Intact and Secured Yes  Pre Treatment Patient Checks  Vascular access used during treatment Catheter  Hepatitis B Surface Antigen Results Negative  Date Hepatitis B Surface Antigen Drawn 11/26/17  Hepatitis B Surface Antibody 147  Date Hepatitis B Surface Antibody Drawn 11/26/17  Hemodialysis Consent Verified Yes  Hemodialysis Standing Orders Initiated Yes  ECG (Telemetry) Monitor On Yes  Prime Ordered Normal Saline  Length of  DialysisTreatment -hour(s) 3 Hour(s)  Dialyzer Elisio 17H NR  Dialysate 3K, 2.5 Ca  Dialysis Anticoagulant None  Dialysate Flow Ordered 600  Blood Flow Rate Ordered 400 mL/min  Ultrafiltration Goal 1.5 Liters  Pre Treatment Labs CBC;Other (Comment) (Lactic acid )  Dialysis Blood Pressure Support Ordered Normal Saline  Education / Care Plan  Dialysis Education Provided Yes  Documented Education in Care  Plan Yes  Hemodialysis Catheter Left Subclavian Double-lumen  Placement Date: 02/11/18   Placed prior to admission: No  Orientation: Left  Access Location: Subclavian  Hemodialysis Catheter Type: Double-lumen  Site Condition No complications  Blue Lumen Status Heparin locked  Red Lumen Status Heparin locked  Purple Lumen Status N/A  Dressing Type Biopatch  Dressing Status Clean;Dry;Intact  Drainage Description None

## 2018-03-14 NOTE — Progress Notes (Signed)
Met with patient's daughter [name] regarding resident's code status. She indicated that during patient's last hospitalization, they requested a documentation of her code status but it didn't happen. She indicated that over the last three years, patient's health status has significantly declined and they do not want her to suffer. She indicated that they prefer conservative treatment with no heroic measures including CPR. Patient's code status changed to DNR/DNI. Code discussion witnessed by patient's nurse Vonna Kotyk.  Magdalene S. [facility] ANP-BC Pulmonary and Critical Care Medicine [facility] Pager [phone] or [phone]  NB: This document was prepared using Dragon voice recognition software and may include unintentional dictation errors.

## 2018-03-14 NOTE — ED Provider Notes (Signed)
[facility] Department of Emergency Medicine   Code Blue CONSULT NOTE  Chief Complaint: Cardiac arrest/unresponsive   Level V Caveat: Unresponsive  History of present illness: I was contacted by the hospital for a CODE BLUE cardiac arrest upstairs and presented to the patient's bedside.   Patient with respiratory distress, hypoxia, loss of pulses. According to nursing staff, patient seems to had a hard candy in her mouth when family member called for help for respiratory distress. Per RN staff patient was coughing, choking, and may have not had pulses briefly. The candy was removed from patient's mouth and she was being bagged when I arrived. Upon arrival to the room, patient was being bagged, following commands, and had a pulse.  ROS: Unable to obtain, Level V caveat  Scheduled Meds: . apixaban  2.5 mg Oral BID  . aspirin EC  81 mg Oral Daily  . atorvastatin  80 mg Oral QHS  . brimonidine  1 drop Both Eyes TID  . carvedilol  6.25 mg Oral BID  . Chlorhexidine Gluconate Cloth  6 each Topical Q0600  . docusate sodium  100 mg Oral BID  . epoetin (EPOGEN/PROCRIT) injection  10,000 Units Intravenous Q T,Th,Sa-HD  . erythromycin   Left Eye Q6H  . famotidine  20 mg Oral Daily  . feeding supplement (NEPRO CARB STEADY)  237 mL Oral BID BM  . feeding supplement (PRO-STAT SUGAR FREE 64)  30 mL Oral TID  . folic acid  1 mg Oral Daily  . gabapentin  100 mg Oral QHS  . hydrALAZINE  25 mg Oral Q8H  . isosorbide mononitrate  30 mg Oral Daily  . mirtazapine  7.5 mg Oral QHS  . multivitamin  1 tablet Oral Daily  . omega-3 acid ethyl esters  1 capsule Oral Daily  . polyethylene glycol  17 g Oral Daily  . polyvinyl alcohol  1 drop Both Eyes BID  . sevelamer carbonate  800 mg Oral TID WC  . thiamine  100 mg Oral Daily  . timolol  1 drop Both Eyes TID  . vitamin C  500 mg Oral BID   Continuous Infusions: PRN Meds:.acetaminophen **OR** acetaminophen, hydrALAZINE, LORazepam  **OR** LORazepam, morphine injection, ondansetron **OR** ondansetron (ZOFRAN) IV, oxyCODONE, sodium chloride Past Medical History:  Diagnosis Date  . (HFpEF) heart failure with preserved ejection fraction ([place])    a. 01/2018 Echo: EF 50-55%, no rwma, Gr1 DD, triv AI, mild MR. Midly dil LA/RV, mod reduced RV fxn. Irreg thickening of TV w/ mobile echodensity that appears to arise from valve.  . Anemia   . Anorexia   . Bacteremia due to Pseudomonas   . Carotid arterial disease ([place])    a. 01/2017 Carotid U/S: RICA <82, LICA <50.  Marland Kitchen Complication of anesthesia    a. Pt reports h/o complication on 9 different occasions - ? hypotension/arrest.  . 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 01/2015 [facility]): 70% LM, p-mLAD 50-60% (Resting FFR 0.75), mRCA 80-90%, ~40 Ost OM & D1-->CABG; b. 01/2016 Staged PCI of LCX x 2 and RCA.  Marland Kitchen ESRD (end stage renal disease) on dialysis ([place])    a. ESRD secondary to acute kidney failure s/p CABG-->PD  . Essential hypertension   . GERD (gastroesophageal reflux disease)   . Heart murmur   . Hypercholesterolemia   . Myocardial infarction ([place]) 2016  . PVD (peripheral vascular disease) ([place])    a. 01/30/2018 PV Angio: Sev Left Popliteal  dzs s/p PTA w/ 66m lutonix DEB w/ mech aspiration of the L popliteal, L AT, and Left tibioperoneal trunck and peroneal artery.  . 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  [facility]: Dr. BMarland KitchenHaithcock: CABG x 3, LIMA to LAD, SVG to RCA, SVG to OM3, EVH  . Sinusitis 2019  . Type II diabetes mellitus with complication (HCC)    CAD   Past Surgical History:  Procedure Laterality Date  . ABDOMINAL HYSTERECTOMY    . AMPUTATION Right 02/25/2018   Procedure: AMPUTATION BELOW KNEE;  Surgeon: SKatha Cabal MD;  Location: [facility] ORS;  Service: Vascular;  Laterality: Right;  . AMPUTATION Left 03/11/2018   Procedure: AMPUTATION BELOW KNEE;  Surgeon: DAlgernon Huxley MD;  Location: [facility];  Service: Vascular;   Laterality: Left;  . AMPUTATION TOE Left 02/06/2018   Procedure: AMPUTATION GREAT TOE;  Surgeon: FSamara Deist DPM;  Location: [facility];  Service: Podiatry;  Laterality: Left;  . ARTERIOVENOUS GRAFT PLACEMENT  05/2016  . AV FISTULA PLACEMENT Left 05/30/2016   Procedure: ARTERIOVENOUS graft;  Surgeon: JAlgernon Huxley MD;  Location: [facility];  Service: Vascular;  Laterality: Left;  . CAPD INSERTION N/A 10/30/2017   Procedure: LAPAROSCOPIC INSERTION CONTINUOUS AMBULATORY PERITONEAL DIALYSIS  (CAPD) CATHETER;  Surgeon: DAlgernon Huxley MD;  Location: [facility];  Service: Vascular;  Laterality: N/A;  . CARDIAC CATHETERIZATION  01/2015   [facility]: Ost LM 70%, p-m LAD 50-60% (Rest FFR + @ 0.75), mRCA 80-90%, ostD1 40%, pOM1 40%  . CATARACT EXTRACTION W/PHACO Left 01/18/2016   Procedure: CATARACT EXTRACTION PHACO AND INTRAOCULAR LENS PLACEMENT (IOC);  Surgeon: BEulogio Bear MD;  Location: [facility];  Service: Ophthalmology;  Laterality: Left;  [place]1.05 AP% 15.5 CDE 10.16 Fluid Pack Lot # [id number]  . CATARACT EXTRACTION W/PHACO Right 08/01/2016   Procedure: CATARACT EXTRACTION PHACO AND INTRAOCULAR LENS PLACEMENT (IOC);  Surgeon: BEulogio Bear MD;  Location: [facility];  Service: Ophthalmology;  Laterality: Right;  [place]01:00.6 AP% 11.4 CDE 6.93  LOT # [id number]  . COLONOSCOPY    . CORONARY ANGIOPLASTY     SENTS 02/12/16  . CORONARY ARTERY BYPASS GRAFT  03/28/15    [facility]: Dr. HWaldemar Dickens LIMA to LAD, SVG to RCA, SVG to OM3, EVH  . DIALYSIS/PERMA CATHETER INSERTION N/A 02/11/2018   Procedure: DIALYSIS/PERMA CATHETER INSERTION;  Surgeon: DAlgernon Huxley MD;  [place] [facility];  Service: Cardiovascular;  Laterality: N/A;  . DIALYSIS/PERMA CATHETER REMOVAL Right 02/04/2018   Procedure: DIALYSIS/PERMA CATHETER REMOVAL;  Surgeon: SKatha Cabal MD;  [place];  Service: Cardiovascular;  Laterality: Right;  . ESOPHAGOGASTRODUODENOSCOPY (EGD) WITH PROPOFOL N/A 10/24/2016    Procedure: ESOPHAGOGASTRODUODENOSCOPY (EGD) WITH PROPOFOL;  Surgeon: WLucilla Lame MD;  Location: [facility];  Service: Endoscopy;  Laterality: N/A;  . EYE SURGERY Bilateral    cataract surgery  . EYE SURGERY     drains for glaucoma  . INSERTION EXPRESS TUBE SHUNT Right 08/01/2016   Procedure: INSERTION EXPRESS TUBE SHUNT;  Surgeon: BEulogio Bear MD;  Location: [facility];  Service: Ophthalmology;  Laterality: Right;  . INSERTION OF AHMED VALVE Left 08/15/2016   Procedure: INSERTION OF AHMED VALVE;  Surgeon: BEulogio Bear MD;  Location: [facility];  Service: Ophthalmology;  Laterality: Left;  . INSERTION OF DIALYSIS CATHETER    . LOWER EXTREMITY ANGIOGRAPHY Right 02/19/2018   Procedure: Lower Extremity Angiography;  Surgeon: DAlgernon Huxley MD;  [place] [place];  Service: Cardiovascular;  Laterality: Right;  . LOWER EXTREMITY ANGIOGRAPHY Left 01/30/2018   Procedure: Lower Extremity Angiography;  Surgeon: Katha Cabal, MD;  Location: [place] [facility];  Service: Cardiovascular;  Laterality: Left;  . REMOVAL OF A DIALYSIS CATHETER N/A 02/25/2018   Procedure: REMOVAL OF A DIALYSIS CATHETER;  Surgeon: Katha Cabal, MD;  Location: [facility];  Service: Vascular;  Laterality: N/A;  . TEMPORARY DIALYSIS CATHETER N/A 02/06/2018   Procedure: TEMPORARY DIALYSIS CATHETER;  Surgeon: Katha Cabal, MD;  Location: [place] [facility];  Service: Cardiovascular;  Laterality: N/A;  . TRANSTHORACIC ECHOCARDIOGRAM  January 2017    EF 60-65%. GR 2 DD. Mild degenerative mitral valve disease but no prolapse or regurgitation. Mild left atrial dilation. Mild to moderate LVH. Pericardial effusion gone  . UPPER EXTREMITY ANGIOGRAPHY Left 06/27/2016   Procedure: Upper Extremity Angiography;  Surgeon: Algernon Huxley, MD;  Location: [place] [facility];  Service: Cardiovascular;  Laterality: Left;  . UPPER EXTREMITY ANGIOGRAPHY Left 08/22/2016   Procedure: Upper Extremity Angiography;   Surgeon: Algernon Huxley, MD;  Location: [place] [facility];  Service: Cardiovascular;  Laterality: Left;  Marland Kitchen VASCULAR SURGERY     Social History   Socioeconomic History  . Marital status: Single    Spouse name: Not on file  . Number of children: Not on file  . Years of education: Not on file  . Highest education level: Not on file  Occupational History  . Not on file  Social Needs  . Financial resource strain: Not on file  . Food insecurity:    Worry: Not on file    Inability: Not on file  . Transportation needs:    Medical: Not on file    Non-medical: Not on file  Tobacco Use  . Smoking status: Never Smoker  . Smokeless tobacco: Never Used  Substance and Sexual Activity  . Alcohol use: No  . Drug use: No  . Sexual activity: Never  Lifestyle  . Physical activity:    Days per week: Not on file    Minutes per session: Not on file  . Stress: Not on file  Relationships  . Social connections:    Talks on phone: Not on file    Gets together: Not on file    Attends religious service: Not on file    Active member of club or organization: Not on file    Attends meetings of clubs or organizations: Not on file    Relationship status: Not on file  . Intimate partner violence:    Fear of current or ex partner: Not on file    Emotionally abused: Not on file    Physically abused: Not on file    Forced sexual activity: Not on file  Other Topics Concern  . Not on file  Social History Narrative   Lives in [place] with family.   Allergies  Allergen Reactions  . Chlorthalidone Anaphylaxis, Itching and Rash  . Fentanyl Rash  . Midazolam Rash  . Ace Inhibitors Other (See Comments)    Reaction:  Hyperkalemia, agitation   . Angiotensin Receptor Blockers Other (See Comments)    Reaction:  Hyperkalemia, agitation   . Norvasc [Amlodipine] Itching and Rash  . Phenergan [Promethazine Hcl] Anxiety    "antsy, can't sit still"    Last set of Vital Signs (not current) Vitals:    03/14/18 1126 03/14/18 1558  BP: (!) 139/50 (!) 249/122  Pulse: (!) 59   Resp: 17  Temp: 97.8 F (36.6 C)   SpO2: 100%       Physical Exam  Gen: following commands Cardiovascular: strong bilateral radial pulses Resp: apneic. Breath sounds equal bilaterally with bagging  Abd: nondistended  Neuro: GCS 14 HEENT: No blood in posterior pharynx Neck: No crepitus Musculoskeletal: b/l BKAs Skin: warm  Procedures  none  CRITICAL CARE None  Medical Decision making  Assessment and Plan    62F with respiratory distress, chocking, momentary loss of pulses.  Upon my arrival to the room patient was being bagged, following commands, bilateral breath sounds, and strong distal pulses.  Rhythm on monitor showed sinus tachycardia.  She was transitioned to 2 L nasal cannula and remained to sat well with no further hypoxia.  No need for CPR or intubation.  I was present in the room until Dr. Juleen China arrived.  Patient is scheduled to undergo dialysis.  At that time care was transferred to him.    Rudene Re, MD 03/14/18 [phone]

## 2018-03-15 ENCOUNTER — Inpatient Hospital Stay (HOSPITAL_COMMUNITY)
Admit: 2018-03-15 | Discharge: 2018-03-15 | Disposition: A | Discharge status: Home / Self Care | Payer: Medicare Other | Attending: Internal Medicine | Admitting: Internal Medicine

## 2018-03-15 ENCOUNTER — Inpatient Hospital Stay: Payer: Medicare Other

## 2018-03-15 DIAGNOSIS — I160 Hypertensive urgency: Principal | ICD-10-CM

## 2018-03-15 DIAGNOSIS — I361 Nonrheumatic tricuspid (valve) insufficiency: Secondary | ICD-10-CM

## 2018-03-15 DIAGNOSIS — I472 Ventricular tachycardia, unspecified: Secondary | ICD-10-CM

## 2018-03-15 DIAGNOSIS — R404 Transient alteration of awareness: Secondary | ICD-10-CM

## 2018-03-15 DIAGNOSIS — I370 Nonrheumatic pulmonary valve stenosis: Secondary | ICD-10-CM

## 2018-03-15 LAB — GLUCOSE, CAPILLARY
Glucose-Capillary: 67 mg/dL — ABNORMAL LOW (ref 70–99)
Glucose-Capillary: 74 mg/dL (ref 70–99)
Glucose-Capillary: 78 mg/dL (ref 70–99)
Glucose-Capillary: 85 mg/dL (ref 70–99)
Glucose-Capillary: 87 mg/dL (ref 70–99)

## 2018-03-15 LAB — CBC WITH DIFFERENTIAL/PLATELET
Abs Immature Granulocytes: 0.11 10*3/uL — ABNORMAL HIGH (ref 0.00–0.07)
Basophils Absolute: 0.1 10*3/uL (ref 0.0–0.1)
Basophils Relative: 0 %
Eosinophils Absolute: 0.8 10*3/uL — ABNORMAL HIGH (ref 0.0–0.5)
Eosinophils Relative: 7 %
HCT: 28.9 % — ABNORMAL LOW (ref 36.0–46.0)
Hemoglobin: 8.6 g/dL — ABNORMAL LOW (ref 12.0–15.0)
Immature Granulocytes: 1 %
Lymphocytes Relative: 10 %
Lymphs Abs: 1.3 10*3/uL (ref 0.7–4.0)
MCH: 30.6 pg (ref 26.0–34.0)
MCHC: 29.8 g/dL — ABNORMAL LOW (ref 30.0–36.0)
MCV: 102.8 fL — ABNORMAL HIGH (ref 80.0–100.0)
Monocytes Absolute: 1 10*3/uL (ref 0.1–1.0)
Monocytes Relative: 8 %
Neutro Abs: 9.6 10*3/uL — ABNORMAL HIGH (ref 1.7–7.7)
Neutrophils Relative %: 74 %
Platelets: 354 10*3/uL (ref 150–400)
RBC: 2.81 MIL/uL — ABNORMAL LOW (ref 3.87–5.11)
RDW: 20.2 % — ABNORMAL HIGH (ref 11.5–15.5)
WBC: 12.9 10*3/uL — ABNORMAL HIGH (ref 4.0–10.5)
nRBC: 0 % (ref 0.0–0.2)

## 2018-03-15 LAB — BASIC METABOLIC PANEL
Anion gap: 11 (ref 5–15)
BUN: 22 mg/dL (ref 8–23)
CO2: 28 mmol/L (ref 22–32)
Calcium: 8.2 mg/dL — ABNORMAL LOW (ref 8.9–10.3)
Chloride: 97 mmol/L — ABNORMAL LOW (ref 98–111)
Creatinine, Ser: 4.85 mg/dL — ABNORMAL HIGH (ref 0.44–1.00)
GFR calc Af Amer: 10 mL/min — ABNORMAL LOW (ref >60)
GFR calc non Af Amer: 8 mL/min — ABNORMAL LOW (ref >60)
Glucose, Bld: 85 mg/dL (ref 70–99)
Potassium: 4.3 mmol/L (ref 3.5–5.1)
Sodium: 136 mmol/L (ref 135–145)

## 2018-03-15 LAB — ECHOCARDIOGRAM COMPLETE
Height: 64 in
Weight: 2031.76 oz

## 2018-03-15 LAB — PHOSPHORUS: Phosphorus: 3.3 mg/dL (ref 2.5–4.6)

## 2018-03-15 LAB — PROCALCITONIN
Procalcitonin: 1.49 ng/mL
Procalcitonin: 2.59 ng/mL

## 2018-03-15 LAB — MAGNESIUM: Magnesium: 2 mg/dL (ref 1.7–2.4)

## 2018-03-15 MED ORDER — LEVETIRACETAM IN NACL 500 MG/100ML IV SOLN
500.0000 mg | INTRAVENOUS | Status: DC
  Filled 2018-03-15: qty 100

## 2018-03-15 MED ORDER — SODIUM CHLORIDE 0.9 % IV SOLN
INTRAVENOUS | Status: DC
  Administered 2018-03-15: 16:00:00 via INTRAVENOUS
  Filled 2018-03-15: qty 500
  Filled 2018-03-15 (×2): qty 5

## 2018-03-15 MED ORDER — DEXTROSE 5 % IV SOLN
500.0000 mg | INTRAVENOUS | Status: DC
  Filled 2018-03-15: qty 0.5

## 2018-03-15 MED ORDER — LEVETIRACETAM 250 MG PO TABS: 250.0000 mg | ORAL_TABLET | ORAL | Status: DC

## 2018-03-15 MED ORDER — LEVETIRACETAM 500 MG PO TABS: 500.0000 mg | ORAL_TABLET | ORAL | Status: DC

## 2018-03-15 MED ORDER — SODIUM CHLORIDE 0.9 % IV SOLN
1.0000 g | Freq: Once | INTRAVENOUS | Status: AC
  Administered 2018-03-15: 1 g via INTRAVENOUS
  Filled 2018-03-15: qty 1

## 2018-03-15 MED ORDER — LEVETIRACETAM 500 MG PO TABS
500.0000 mg | ORAL_TABLET | Freq: Every day | ORAL | Status: DC
  Administered 2018-03-16 – 2018-03-18 (×3): 500 mg via ORAL
  Filled 2018-03-15 (×6): qty 1

## 2018-03-15 NOTE — Progress Notes (Signed)
Post HD assessment    03/15/18 0012  Neurological  Level of Consciousness Alert  Orientation Level Oriented to person;Oriented to place;Disoriented to time;Disoriented to situation  Respiratory  Respiratory Pattern Regular;Unlabored  Chest Assessment Chest expansion symmetrical  Cardiac  Pulse Irregular  ECG Monitor Yes  Cardiac Rhythm NSR  Ectopy Couplet PVC's  Ectopy Frequency Occasional  Vascular  R Radial Pulse +2  L Radial Pulse +2  Edema Generalized  Integumentary  Integumentary (WDL) X  Skin Color Appropriate for ethnicity  Musculoskeletal  Musculoskeletal (WDL) X  Generalized Weakness Yes  Assistive Device None  GU Assessment  Genitourinary (WDL) X  Genitourinary Symptoms  (HD)  Psychosocial  Psychosocial (WDL) X  Patient Behaviors  (confusion )  Psychosocial Additional Assessments Family behavior  Family Behavior Appropriate for situation;Supportive;Calm;Cooperative  Emotional support given Given to patient;Given to patient's family

## 2018-03-15 NOTE — Progress Notes (Signed)
Post HD assessment. Pt did not tolerated tx well. Pt experienced several drops in BP requiring 18m NS bolus x3 and initiation of norepinephrine, MD aware. UF turned off, BFR decreased. Pt became unresponsive during HD tx and began to desat into the low 50's, pt was placed on a non-rebreather mask and norepinephrine was increased. RT contacted. Pt became responsive just before RT arrived. Pt experienced confusion post unresponsive episode.  Post HD tx pt was only oriented to person and place, not time or situation, MD aware. Pt was able to finish HD tx with UF off and BFR at 150, along with norepinephrine for pressure support. Family at the bedside. Net UF  -204, goal not met.    03/15/18 0015  Vital Signs  Temp 98.3 F (36.8 C)  Temp Source Oral  Pulse Rate 74  Pulse Rate Source Monitor  Resp 19  BP (!) 113/100  BP Location Right Arm  BP Method Automatic  Patient Position (if appropriate) Lying  Oxygen Therapy  SpO2 93 %  O2 Device Non-rebreather Mask  Dialysis Weight  Weight 57.6 kg  Type of Weight Post-Dialysis  Post-Hemodialysis Assessment  Rinseback Volume (mL) 250 mL  KECN 39 V  Dialyzer Clearance Lightly streaked  Duration of HD Treatment -hour(s) 3 hour(s)  Hemodialysis Intake (mL) 800 mL  UF Total -Machine (mL) 596 mL  Net UF (mL) -204 mL  Tolerated HD Treatment No (Comment) (see notes )  Education / Care Plan  Dialysis Education Provided Yes  Documented Education in Care Plan Yes  Hemodialysis Catheter Left Subclavian Double-lumen  Placement Date: 02/11/18   Placed prior to admission: No  Orientation: Left  Access Location: Subclavian  Hemodialysis Catheter Type: Double-lumen  Site Condition No complications  Blue Lumen Status Heparin locked  Red Lumen Status Heparin locked  Purple Lumen Status N/A  Catheter fill solution Heparin 1000 units/ml  Catheter fill volume (Arterial) 1.7 cc  Catheter fill volume (Venous) 1.7  Dressing Type Biopatch  Dressing Status  Clean;Dry;Intact  Drainage Description None  Post treatment catheter status Capped and Clamped

## 2018-03-15 NOTE — Progress Notes (Signed)
HD tx end    03/15/18 0002  Vital Signs  Pulse Rate 68  Pulse Rate Source Monitor  Resp 11  BP (!) 152/53  BP Location Right Arm  BP Method Automatic  Patient Position (if appropriate) Lying  Oxygen Therapy  SpO2 100 %  O2 Device Non-rebreather Mask  During Hemodialysis Assessment  Dialysis Fluid Bolus Normal Saline  Bolus Amount (mL) 250 mL  Intra-Hemodialysis Comments Tx completed

## 2018-03-15 NOTE — Consult Note (Signed)
Reason consult: encephalopathy  Referring Physician: Dr. Margaretmary Eddy  CC: confusion   HPI: Stephanie Ellis is an 67 y.o. 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 Neurology consulted for brief episodes of unconsciousness.    Past Medical History:  Diagnosis Date  . (HFpEF) heart failure with preserved ejection fraction ([place])    a. 01/2018 Echo: EF 50-55%, no rwma, Gr1 DD, triv AI, mild MR. Midly dil LA/RV, mod reduced RV fxn. Irreg thickening of TV w/ mobile echodensity that appears to arise from valve.  . Anemia   . Anorexia   . Bacteremia due to Pseudomonas   . Carotid arterial disease ([place])    a. 01/2017 Carotid U/S: RICA <88, LICA <50.  Marland Kitchen Complication of anesthesia    a. Pt reports h/o complication on 9 different occasions - ? hypotension/arrest.  . 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 01/2015 Cath [facility]): 70% LM, p-mLAD 50-60% (Resting FFR 0.75), mRCA 80-90%, ~40 Ost OM & D1-->CABG; b. 01/2016 Staged PCI of LCX x 2 and RCA.  Marland Kitchen ESRD (end stage renal disease) on dialysis ([place])    a. ESRD secondary to acute kidney failure s/p CABG-->PD  . Essential hypertension   . GERD (gastroesophageal reflux disease)   . Heart murmur   . Hypercholesterolemia   . Myocardial infarction ([place]) 2016  . PVD (peripheral vascular disease) ([place])    a. 01/30/2018 PV Angio: Sev Left Popliteal dzs s/p PTA w/ 41m lutonix DEB w/ mech aspiration of the L popliteal, L AT, and Left tibioperoneal trunck and peroneal artery.  . 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  [facility]: Dr. BMarland KitchenHaithcock: CABG x 3, LIMA to LAD,  SVG to RCA, SVG to OM3, EVH  . Sinusitis 2019  . Type II diabetes mellitus with complication (HCC)    CAD    Past Surgical History:  Procedure Laterality Date  . ABDOMINAL HYSTERECTOMY    . AMPUTATION Right 02/25/2018   Procedure: AMPUTATION BELOW KNEE;  Surgeon: SKatha Cabal MD;  Location: [facility];  Service: Vascular;  Laterality: Right;  . AMPUTATION Left 03/11/2018   Procedure: AMPUTATION BELOW KNEE;  Surgeon: DAlgernon Huxley MD;  Location: [facility];  Service: Vascular;  Laterality: Left;  . AMPUTATION TOE Left 02/06/2018   Procedure: AMPUTATION GREAT TOE;  Surgeon: FSamara Deist DPM;  Location: [facility];  Service: Podiatry;  Laterality: Left;  . ARTERIOVENOUS GRAFT PLACEMENT  05/2016  . AV FISTULA PLACEMENT Left 05/30/2016   Procedure: ARTERIOVENOUS graft;  Surgeon: JAlgernon Huxley MD;  Location: [facility];  Service: Vascular;  Laterality: Left;  . CAPD INSERTION N/A 10/30/2017   Procedure: LAPAROSCOPIC INSERTION CONTINUOUS AMBULATORY PERITONEAL DIALYSIS  (CAPD) CATHETER;  Surgeon: DAlgernon Huxley MD;  Location: [facility];  Service: Vascular;  Laterality: N/A;  . CARDIAC CATHETERIZATION  01/2015   [facility]: Ost LM 70%, p-m LAD 50-60% (Rest FFR + @ 0.75), mRCA 80-90%, ostD1 40%, pOM1 40%  . CATARACT EXTRACTION W/PHACO Left 01/18/2016   Procedure: CATARACT EXTRACTION PHACO AND INTRAOCULAR LENS PLACEMENT (IOC);  Surgeon: BEulogio Bear MD;  Location: [facility];  Service: Ophthalmology;  Laterality: Left;  [place]1.05 AP% 15.5  CDE 10.16 Fluid Pack Lot # [id number]  . CATARACT EXTRACTION W/PHACO Right 08/01/2016   Procedure: CATARACT EXTRACTION PHACO AND INTRAOCULAR LENS PLACEMENT (IOC);  Surgeon: Eulogio Bear, MD;  Location: [facility];  Service: Ophthalmology;  Laterality: Right;  [place] 01:00.6 AP% 11.4 CDE 6.93  LOT # [id number]  . COLONOSCOPY    . CORONARY ANGIOPLASTY     SENTS 02/12/16  . CORONARY ARTERY BYPASS GRAFT  03/28/15    [facility]: Dr. Waldemar Dickens: LIMA to LAD, SVG to RCA, SVG to OM3, EVH   . DIALYSIS/PERMA CATHETER INSERTION N/A 02/11/2018   Procedure: DIALYSIS/PERMA CATHETER INSERTION;  Surgeon: Algernon Huxley, MD;  Location: [place] [facility];  Service: Cardiovascular;  Laterality: N/A;  . DIALYSIS/PERMA CATHETER REMOVAL Right 02/04/2018   Procedure: DIALYSIS/PERMA CATHETER REMOVAL;  Surgeon: Katha Cabal, MD;  Location: [place] [facility];  Service: Cardiovascular;  Laterality: Right;  . ESOPHAGOGASTRODUODENOSCOPY (EGD) WITH PROPOFOL N/A 10/24/2016   Procedure: ESOPHAGOGASTRODUODENOSCOPY (EGD) WITH PROPOFOL;  Surgeon: [name], MD;  Location: [facility];  Service: Endoscopy;  Laterality: N/A;  . EYE SURGERY Bilateral    cataract surgery  . EYE SURGERY     drains for glaucoma  . INSERTION EXPRESS TUBE SHUNT Right 08/01/2016   Procedure: INSERTION EXPRESS TUBE SHUNT;  Surgeon: Eulogio Bear, MD;  Location: [facility];  Service: Ophthalmology;  Laterality: Right;  . INSERTION OF AHMED VALVE Left 08/15/2016   Procedure: INSERTION OF AHMED VALVE;  Surgeon: Eulogio Bear, MD;  Location: [facility];  Service: Ophthalmology;  Laterality: Left;  . INSERTION OF DIALYSIS CATHETER    . LOWER EXTREMITY ANGIOGRAPHY Right 02/19/2018   Procedure: Lower Extremity Angiography;  Surgeon: Algernon Huxley, MD;  Location: [place] [facility];  Service: Cardiovascular;  Laterality: Right;  . LOWER EXTREMITY ANGIOGRAPHY Left 01/30/2018   Procedure: Lower Extremity Angiography;  Surgeon: Katha Cabal, MD;  Location: [place] [facility];  Service: Cardiovascular;  Laterality: Left;  . REMOVAL OF A DIALYSIS CATHETER N/A 02/25/2018   Procedure: REMOVAL OF A DIALYSIS CATHETER;  Surgeon: Katha Cabal, MD;  Location: [facility];  Service: Vascular;  Laterality: N/A;  . TEMPORARY DIALYSIS CATHETER N/A 02/06/2018   Procedure: TEMPORARY DIALYSIS CATHETER;  Surgeon: Katha Cabal, MD;  Location: [place] [facility];  Service: Cardiovascular;  Laterality: N/A;  . TRANSTHORACIC  ECHOCARDIOGRAM  January 2017    EF 60-65%. GR 2 DD. Mild degenerative mitral valve disease but no prolapse or regurgitation. Mild left atrial dilation. Mild to moderate LVH. Pericardial effusion gone  . UPPER EXTREMITY ANGIOGRAPHY Left 06/27/2016   Procedure: Upper Extremity Angiography;  Surgeon: Algernon Huxley, MD;  Location: [place] [facility];  Service: Cardiovascular;  Laterality: Left;  . UPPER EXTREMITY ANGIOGRAPHY Left 08/22/2016   Procedure: Upper Extremity Angiography;  Surgeon: Algernon Huxley, MD;  Location: [place] [facility];  Service: Cardiovascular;  Laterality: Left;  Marland Kitchen VASCULAR SURGERY      Family History  Problem Relation Age of Onset  . Diabetes Mellitus II Mother   . Heart failure Mother   . Pancreatic cancer Father     Social History:  reports that she has never smoked. She has never used smokeless tobacco. She reports that she does not drink alcohol or use drugs.  Allergies  Allergen Reactions  . Chlorthalidone Anaphylaxis, Itching and Rash  . Fentanyl Rash  . Midazolam Rash  . Ace Inhibitors Other (See Comments)    Reaction:  Hyperkalemia, agitation   .  Angiotensin Receptor Blockers Other (See Comments)    Reaction:  Hyperkalemia, agitation   . Norvasc [Amlodipine] Itching and Rash  . Phenergan [Promethazine Hcl] Anxiety    "antsy, can't sit still"    Medications: I have reviewed the patient's current medications.  ROS: History obtained from the patient  General ROS: negative for - chills, fatigue, fever, night sweats, weight gain or weight loss Psychological ROS: negative for - behavioral disorder, hallucinations, memory difficulties, mood swings or suicidal ideation Ophthalmic ROS: negative for - blurry vision, double vision, eye pain or loss of vision ENT ROS: negative for - epistaxis, nasal discharge, oral lesions, sore throat, tinnitus or vertigo Allergy and Immunology ROS: negative for - hives or itchy/watery eyes Hematological and Lymphatic ROS:  negative for - bleeding problems, bruising or swollen lymph nodes Endocrine ROS: negative for - galactorrhea, hair pattern changes, polydipsia/polyuria or temperature intolerance Respiratory ROS: negative for - cough, hemoptysis, shortness of breath or wheezing Cardiovascular ROS: negative for - chest pain, dyspnea on exertion, edema or irregular heartbeat Gastrointestinal ROS: negative for - abdominal pain, diarrhea, hematemesis, nausea/vomiting or stool incontinence Genito-Urinary ROS: negative for - dysuria, hematuria, incontinence or urinary frequency/urgency Musculoskeletal ROS: negative for - joint swelling or muscular weakness Neurological ROS: as noted in HPI Dermatological ROS: negative for rash and skin lesion changes  Physical Examination: Blood pressure (!) 108/38, pulse 60, temperature 97.9 F (36.6 C), temperature source Oral, resp. rate 13, height 5' 4"  (1.626 m), weight 57.6 kg, SpO2 99 %.    Neurological Examination   Mental Status: Alert to name.  Cranial Nerves: II: Discs flat bilaterally; Visual fields grossly normal, pupils equal, round, reactive to light and accommodation III,IV, VI: ptosis not present, extra-ocular motions intact bilaterally V,VII: smile symmetric, facial light touch sensation normal bilaterally VIII: hearing normal bilaterally IX,X: gag reflex present XI: bilateral shoulder shrug XII: midline tongue extension Motor: Generalized weakness Sensory: Pinprick and light touch intact throughout, bilaterally Deep Tendon Reflexes: 1+ and symmetric throughout Plantars: Right: downgoing   Left: downgoing Cerebellar: Not tested Gait: not tested       Laboratory Studies:   Basic Metabolic Panel: Recent Labs  Lab 03/10/18 0931 03/11/18 0457 03/13/18 1803 03/13/18 1815 03/14/18 1029 03/14/18 2123  NA 141 142  --  136 139 135  K 3.9 3.8  --  4.9 4.7 4.6  CL 100 99  --  97* 95* 94*  CO2 31 32  --  26 29 27   GLUCOSE 131* 129*  --  143* 121*  207*  BUN 23 21  --  25* 36* 42*  CREATININE 5.07* 3.86*  --  5.08* 6.42* 7.18*  CALCIUM 8.5* 8.6*  --  8.6* 9.0 8.5*  MG  --  1.9 2.1  --   --  2.1  PHOS  --   --   --   --  5.3* 4.9*    Liver Function Tests: Recent Labs  Lab 03/14/18 1029  ALBUMIN 2.2*   No results for input(s): LIPASE, AMYLASE in the last 168 hours. No results for input(s): AMMONIA in the last 168 hours.  CBC: Recent Labs  Lab 03/12/18 0950 03/13/18 0313 03/14/18 1029 03/14/18 2302 03/15/18 0652  WBC 12.0* 14.1* 16.6* 16.3* 12.9*  NEUTROABS  --   --   --  13.8* 9.6*  HGB 7.7* 9.3* 8.6* 8.4* 8.6*  HCT 26.5* 32.0* 29.6* 28.3* 28.9*  MCV 103.5* 103.9* 103.5* 102.9* 102.8*  PLT 351 348 387 357 354    Cardiac  Enzymes: Recent Labs  Lab 03/08/18 1630 03/10/18 0931 03/10/18 1559 03/10/18 2221 03/14/18 2302  TROPONINI 0.05* 0.04* 0.04* 0.04* 0.21*    BNP: Invalid input(s): POCBNP  CBG: Recent Labs  Lab 03/14/18 1126 03/14/18 1601 03/14/18 2144 03/15/18 0732 03/15/18 1135  GLUCAP 109* 156* 136* 85 96    Microbiology: Results for orders placed or performed during the hospital encounter of 03/08/18  MRSA PCR Screening     Status: None   Collection Time: 03/10/18  8:24 AM  Result Value Ref Range Status   MRSA by PCR NEGATIVE NEGATIVE Final    Comment:        The GeneXpert MRSA Assay (FDA approved for NASAL specimens only), is one component of a comprehensive MRSA colonization surveillance program. It is not intended to diagnose MRSA infection nor to guide or monitor treatment for MRSA infections. Performed at [facility], [address] [place], [place] [address]   CULTURE, BLOOD (ROUTINE X 2) w Reflex to ID Panel     Status: None (Preliminary result)   Collection Time: 03/14/18  6:08 PM  Result Value Ref Range Status   Specimen Description BLOOD LEFT ANTECUBITAL  Final   Special Requests   Final    BOTTLES DRAWN AEROBIC AND ANAEROBIC Blood Culture results may not be  optimal due to an excessive volume of blood received in culture bottles   Culture   Final    NO GROWTH < 12 HOURS Performed at [facility], [address], [place], [place] [address]    Report Status PENDING  Incomplete  CULTURE, BLOOD (ROUTINE X 2) w Reflex to ID Panel     Status: None (Preliminary result)   Collection Time: 03/14/18  7:27 PM  Result Value Ref Range Status   Specimen Description BLOOD LEFT ANTECUBITAL  Final   Special Requests BOTTLES DRAWN AEROBIC AND ANAEROBIC  Final   Culture   Final    NO GROWTH < 12 HOURS Performed at [facility], [address] [place], [place] [address]    Report Status PENDING  Incomplete    Coagulation Studies: No results for input(s): LABPROT, INR in the last 72 hours.  Urinalysis: No results for input(s): COLORURINE, LABSPEC, PHURINE, GLUCOSEU, HGBUR, BILIRUBINUR, KETONESUR, PROTEINUR, UROBILINOGEN, NITRITE, LEUKOCYTESUR in the last 168 hours.  Invalid input(s): APPERANCEUR  Lipid Panel:  No results found for: CHOL, TRIG, HDL, CHOLHDL, VLDL, LDLCALC  HgbA1C:  Lab Results  Component Value Date   HGBA1C 6.5 (H) 02/18/2018    Urine Drug Screen:  No results found for: LABOPIA, COCAINSCRNUR, LABBENZ, AMPHETMU, THCU, LABBARB  Alcohol Level: No results for input(s): ETH in the last 168 hours.    Imaging: Dg Chest 2 View  Result Date: 03/14/2018 CLINICAL DATA:  Hypoxia EXAM: CHEST - 2 VIEW COMPARISON:  03/08/2018 and prior radiographs FINDINGS: Mild cardiomegaly and CABG changes again noted. A LEFT IJ central venous catheter with tip overlying the SUPERIOR cavoatrial junction again noted. Decreased pulmonary vascular congestion and improved bibasilar aeration noted. Trace bilateral pleural effusions are present. No pneumothorax. IMPRESSION: Decreased pulmonary vascular congestion and improved bibasilar aeration. No other significant change. Electronically Signed   By: Margarette Canada M.D.   On: 03/14/2018 09:08   Ct  Head Wo Contrast  Result Date: 03/13/2018 CLINICAL DATA:  67 year old female with possible seizure like activity. Unresponsiveness. EXAM: CT HEAD WITHOUT CONTRAST TECHNIQUE: Contiguous axial images were obtained from the base of the skull through the vertex without intravenous contrast. COMPARISON:  Head CT 09/27/2017. FINDINGS: Brain: Patchy areas of decreased attenuation are noted throughout the deep and periventricular white matter of the cerebral hemispheres bilaterally, compatible with mild chronic microvascular ischemic disease. No evidence of acute infarction, hemorrhage, hydrocephalus, extra-axial collection or mass lesion/mass effect. Vascular: No hyperdense vessel or unexpected calcification. Skull: Normal. Negative for fracture or focal lesion. Sinuses/Orbits: Complete opacification of the left sphenoid sinus. Areas of mucosal thickening in the posterior left ethmoidal sinuses and right sphenoid sinus. Other: None. IMPRESSION: 1. No acute intracranial abnormalities. 2. Mild chronic microvascular ischemic changes in the cerebral white matter, as above. 3. Chronic paranasal sinus disease, slightly worsened compared to the prior study, as above. Electronically Signed   By: Vinnie Langton M.D.   On: 03/13/2018 20:37   Mr Brain Wo Contrast  Result Date: 03/14/2018 CLINICAL DATA:  Intermittent delirium versus unresponsiveness. History of septic emboli to extremities, end-stage renal disease on dialysis, hypertension, and hypercholesterolemia. EXAM: MRI HEAD WITHOUT CONTRAST TECHNIQUE: Multiplanar, multiecho pulse sequences of the brain and surrounding structures were obtained without intravenous contrast. COMPARISON:  MRI head November 15, 2017 and CT HEAD March 13, 2018. FINDINGS: INTRACRANIAL CONTENTS: No reduced diffusion to suggest acute ischemia or hypercellular tumor. LEFT temporal chronic microhemorrhage. Mild parenchymal brain volume loss. Old small LEFT cerebellar infarct. Patchy  supratentorial white matter FLAIR T2 hyperintensities. No hydrocephalus. No suspicious parenchymal signal, masses, mass effect. No abnormal extra-axial fluid collections. No extra-axial masses. VASCULAR: Normal major intracranial vascular flow voids present at skull base. SKULL AND UPPER CERVICAL SPINE: No abnormal sellar expansion. No suspicious calvarial bone marrow signal. Craniocervical junction maintained. SINUSES/ORBITS: LEFT mastoid effusion. Chronic severe LEFT sphenoid sinusitis. Status post bilateral ocular lens implants.The included ocular globes and orbital contents are non-suspicious. Bilateral glaucoma drainage device versus scleral banding. OTHER: None. IMPRESSION: 1. No acute intracranial process. 2. Stable mild chronic small vessel ischemic changes and old small LEFT cerebellar infarct. 3. Mild parenchymal brain volume loss. Electronically Signed   By: Elon Alas M.D.   On: 03/14/2018 17:53   US Carotid Bilateral  Result Date: 03/14/2018 CLINICAL DATA:  Cerebral infarct, hypertension, hyperlipidemia EXAM: BILATERAL CAROTID DUPLEX ULTRASOUND TECHNIQUE: Pearline Cables scale imaging, color Doppler and duplex ultrasound were performed of bilateral carotid and vertebral arteries in the neck. COMPARISON:  None. FINDINGS: Criteria: Quantification of carotid stenosis is based on velocity parameters that correlate the residual internal carotid diameter with NASCET-based stenosis levels, using the diameter of the distal internal carotid lumen as the denominator for stenosis measurement. The following velocity measurements were obtained: RIGHT ICA: 146/32 cm/sec CCA: 41/32 cm/sec SYSTOLIC ICA/CCA RATIO:  1.6 ECA: 85 cm/sec LEFT ICA: 137/26 cm/sec CCA: 440/10 cm/sec SYSTOLIC ICA/CCA RATIO:  1.2 ECA: 102 cm/sec RIGHT CAROTID ARTERY: Scattered moderate partially calcified right carotid bifurcation atherosclerosis. Despite this, there is no hemodynamically significant right ICA stenosis, velocity elevation, or  turbulent flow. Degree of narrowing estimated at less than 50%. RIGHT VERTEBRAL ARTERY:  Antegrade LEFT CAROTID ARTERY: Similar scattered moderate left carotid bifurcation atherosclerosis. Despite this, there is no hemodynamically significant left ICA stenosis, velocity elevation, turbulent flow. Degree of narrowing also less than 50%. LEFT VERTEBRAL ARTERY:  Antegrade. Included views of the left internal jugular vein demonstrates hypoechoic intraluminal thrombus appearing acute and nearly occlusive. Patient does have a tunneled hemodialysis catheter on this side as well. IMPRESSION: Moderate bilateral carotid atherosclerosis. No hemodynamically significant ICA stenosis. Degree of narrowing less than 50% bilaterally by ultrasound criteria. Patent antegrade vertebral flow bilaterally Left internal jugular vein thrombosis,  suspect related to tunneled left hemo dialysis catheter. Electronically Signed   By: Jerilynn Mages.  Shick M.D.   On: 03/14/2018 08:17   Dg Chest Port 1 View  Result Date: 03/15/2018 CLINICAL DATA:  Atelectasis, history coronary artery disease post CABG and MI, end-stage renal disease, hypertension EXAM: PORTABLE CHEST 1 VIEW COMPARISON:  Portable exam 0424 hours compared to 03/14/2018 FINDINGS: LEFT jugular dual-lumen central venous catheter with tip projecting over RIGHT atrium. Enlargement of cardiac silhouette post CABG. Sub pulmonary vascular congestion. Perihilar infiltrates favor pulmonary edema. Atherosclerotic calcification aorta. Tiny RIGHT pleural effusion. No pneumothorax. IMPRESSION: Mild pulmonary edema with tiny RIGHT pleural effusion. Electronically Signed   By: Lavonia Dana M.D.   On: 03/15/2018 07:10     Assessment/Plan:   67 y.o. 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 Neurology consulted for brief episodes of unconsciousness.   - Described episode of blank stare. No prior hx of seizures but it does sound like blank stares with partial seizures - Agree with trial of Keppra 500 before HD and 500 post HD - EEG tomorrow.  03/15/2018, 12:02 PM

## 2018-03-15 NOTE — Progress Notes (Signed)
[facility]  ROUNDING NOTE   Subjective:   Daughter at bedside  Patient had code called yesterday for unresponsiveness. Patient was brought back but was lethargic. MRI done with no lesions.   Hemodialysis last night. Did not tolerate well at all. Patient required norepinephrine and fluid bolus. Patient was then found to go unresponsive while talking to me and the dialysis nurse during treatment.  Treatment was cut short.   Objective:  Vital signs in last 24 hours:  Temp:  [97.8 F (36.6 C)-98.5 F (36.9 C)] 98.3 F (36.8 C) (11/24 0015) Pulse Rate:  [59-89] 61 (11/24 0210) Resp:  [0-24] 9 (11/24 0210) BP: (85-249)/(39-128) 125/45 (11/24 0200) SpO2:  [68 %-100 %] 100 % (11/24 0210) Weight:  [56 kg-57.6 kg] 57.6 kg (11/24 0015)  Weight change:  Filed Weights   03/14/18 1651 03/14/18 2029 03/15/18 0015  Weight: 56 kg 56.8 kg 57.6 kg    Intake/Output: I/O last 3 completed shifts: In: 240 [P.O.:240] Out: -204    Intake/Output this shift:  No intake/output data recorded.  Physical Exam: General: NAD, laying in bed  Head: Normocephalic, atraumatic. Moist oral mucosal membranes  Eyes: Anicteric, PERRL  Neck: Supple, trachea midline  Lungs:  Crackles, 2 Liters [place] O2  Heart: Regular rate and rhythm  Abdomen:  Soft, nontender,   Extremities:  bilateral BKA - left dressings are clean  Neurologic: Nonfocal, moving all four extremities, alert and oriented   Skin: No lesions  Access: Left IJ permcath    Basic Metabolic Panel: Recent Labs  Lab 03/10/18 0931 03/11/18 0457 03/13/18 1803 03/13/18 1815 03/14/18 1029 03/14/18 2123  NA 141 142  --  136 139 135  K 3.9 3.8  --  4.9 4.7 4.6  CL 100 99  --  97* 95* 94*  CO2 31 32  --  26 29 27   GLUCOSE 131* 129*  --  143* 121* 207*  BUN 23 21  --  25* 36* 42*  CREATININE 5.07* 3.86*  --  5.08* 6.42* 7.18*  CALCIUM 8.5* 8.6*  --  8.6* 9.0 8.5*  MG  --  1.9 2.1  --   --  2.1  PHOS  --   --   --   --  5.3* 4.9*     Liver Function Tests: Recent Labs  Lab 03/14/18 1029  ALBUMIN 2.2*   No results for input(s): LIPASE, AMYLASE in the last 168 hours. No results for input(s): AMMONIA in the last 168 hours.  CBC: Recent Labs  Lab 03/12/18 0950 03/13/18 0313 03/14/18 1029 03/14/18 2302 03/15/18 0652  WBC 12.0* 14.1* 16.6* 16.3* 12.9*  NEUTROABS  --   --   --  13.8* 9.6*  HGB 7.7* 9.3* 8.6* 8.4* 8.6*  HCT 26.5* 32.0* 29.6* 28.3* 28.9*  MCV 103.5* 103.9* 103.5* 102.9* 102.8*  PLT 351 348 387 357 354    Cardiac Enzymes: Recent Labs  Lab 03/08/18 1630 03/10/18 0931 03/10/18 1559 03/10/18 2221 03/14/18 2302  TROPONINI 0.05* 0.04* 0.04* 0.04* 0.21*    BNP: Invalid input(s): POCBNP  CBG: Recent Labs  Lab 03/14/18 0904 03/14/18 1126 03/14/18 1601 03/14/18 2144 03/15/18 0732  GLUCAP 123* 109* 156* 136* 60    Microbiology: Results for orders placed or performed during the hospital encounter of 03/08/18  MRSA PCR Screening     Status: None   Collection Time: 03/10/18  8:24 AM  Result Value Ref Range Status   MRSA by PCR NEGATIVE NEGATIVE Final    Comment:  The GeneXpert MRSA Assay (FDA approved for NASAL specimens only), is one component of a comprehensive MRSA colonization surveillance program. It is not intended to diagnose MRSA infection nor to guide or monitor treatment for MRSA infections. Performed at [facility], [address] [place], [place] [address]   CULTURE, BLOOD (ROUTINE X 2) w Reflex to ID Panel     Status: None (Preliminary result)   Collection Time: 03/14/18  6:08 PM  Result Value Ref Range Status   Specimen Description BLOOD LEFT ANTECUBITAL  Final   Special Requests   Final    BOTTLES DRAWN AEROBIC AND ANAEROBIC Blood Culture results may not be optimal due to an excessive volume of blood received in culture bottles   Culture   Final    NO GROWTH < 12 HOURS Performed at [facility], [address], [place],  [place] [address]    Report Status PENDING  Incomplete  CULTURE, BLOOD (ROUTINE X 2) w Reflex to ID Panel     Status: None (Preliminary result)   Collection Time: 03/14/18  7:27 PM  Result Value Ref Range Status   Specimen Description BLOOD LEFT ANTECUBITAL  Final   Special Requests BOTTLES DRAWN AEROBIC AND ANAEROBIC  Final   Culture   Final    NO GROWTH < 12 HOURS Performed at [facility], [address], [place], [place] [address]    Report Status PENDING  Incomplete    Coagulation Studies: No results for input(s): LABPROT, INR in the last 72 hours.  Urinalysis: No results for input(s): COLORURINE, LABSPEC, PHURINE, GLUCOSEU, HGBUR, BILIRUBINUR, KETONESUR, PROTEINUR, UROBILINOGEN, NITRITE, LEUKOCYTESUR in the last 72 hours.  Invalid input(s): APPERANCEUR    Imaging: Dg Chest 2 View  Result Date: 03/14/2018 CLINICAL DATA:  Hypoxia EXAM: CHEST - 2 VIEW COMPARISON:  03/08/2018 and prior radiographs FINDINGS: Mild cardiomegaly and CABG changes again noted. A LEFT IJ central venous catheter with tip overlying the SUPERIOR cavoatrial junction again noted. Decreased pulmonary vascular congestion and improved bibasilar aeration noted. Trace bilateral pleural effusions are present. No pneumothorax. IMPRESSION: Decreased pulmonary vascular congestion and improved bibasilar aeration. No other significant change. Electronically Signed   By: Margarette Canada M.D.   On: 03/14/2018 09:08   Ct Head Wo Contrast  Result Date: 03/13/2018 CLINICAL DATA:  67 year old female with possible seizure like activity. Unresponsiveness. EXAM: CT HEAD WITHOUT CONTRAST TECHNIQUE: Contiguous axial images were obtained from the base of the skull through the vertex without intravenous contrast. COMPARISON:  Head CT 09/27/2017. FINDINGS: Brain: Patchy areas of decreased attenuation are noted throughout the deep and periventricular white matter of the cerebral hemispheres bilaterally, compatible with mild chronic  microvascular ischemic disease. No evidence of acute infarction, hemorrhage, hydrocephalus, extra-axial collection or mass lesion/mass effect. Vascular: No hyperdense vessel or unexpected calcification. Skull: Normal. Negative for fracture or focal lesion. Sinuses/Orbits: Complete opacification of the left sphenoid sinus. Areas of mucosal thickening in the posterior left ethmoidal sinuses and right sphenoid sinus. Other: None. IMPRESSION: 1. No acute intracranial abnormalities. 2. Mild chronic microvascular ischemic changes in the cerebral white matter, as above. 3. Chronic paranasal sinus disease, slightly worsened compared to the prior study, as above. Electronically Signed   By: Vinnie Langton M.D.   On: 03/13/2018 20:37   Mr Brain Wo Contrast  Result Date: 03/14/2018 CLINICAL DATA:  Intermittent delirium versus unresponsiveness. History of septic emboli to extremities, end-stage renal disease on dialysis, hypertension, and hypercholesterolemia. EXAM: MRI HEAD WITHOUT CONTRAST TECHNIQUE: Multiplanar, multiecho pulse  sequences of the brain and surrounding structures were obtained without intravenous contrast. COMPARISON:  MRI head November 15, 2017 and CT HEAD March 13, 2018. FINDINGS: INTRACRANIAL CONTENTS: No reduced diffusion to suggest acute ischemia or hypercellular tumor. LEFT temporal chronic microhemorrhage. Mild parenchymal brain volume loss. Old small LEFT cerebellar infarct. Patchy supratentorial white matter FLAIR T2 hyperintensities. No hydrocephalus. No suspicious parenchymal signal, masses, mass effect. No abnormal extra-axial fluid collections. No extra-axial masses. VASCULAR: Normal major intracranial vascular flow voids present at skull base. SKULL AND UPPER CERVICAL SPINE: No abnormal sellar expansion. No suspicious calvarial bone marrow signal. Craniocervical junction maintained. SINUSES/ORBITS: LEFT mastoid effusion. Chronic severe LEFT sphenoid sinusitis. Status post bilateral ocular  lens implants.The included ocular globes and orbital contents are non-suspicious. Bilateral glaucoma drainage device versus scleral banding. OTHER: None. IMPRESSION: 1. No acute intracranial process. 2. Stable mild chronic small vessel ischemic changes and old small LEFT cerebellar infarct. 3. Mild parenchymal brain volume loss. Electronically Signed   By: Elon Alas M.D.   On: 03/14/2018 17:53   US Carotid Bilateral  Result Date: 03/14/2018 CLINICAL DATA:  Cerebral infarct, hypertension, hyperlipidemia EXAM: BILATERAL CAROTID DUPLEX ULTRASOUND TECHNIQUE: Pearline Cables scale imaging, color Doppler and duplex ultrasound were performed of bilateral carotid and vertebral arteries in the neck. COMPARISON:  None. FINDINGS: Criteria: Quantification of carotid stenosis is based on velocity parameters that correlate the residual internal carotid diameter with NASCET-based stenosis levels, using the diameter of the distal internal carotid lumen as the denominator for stenosis measurement. The following velocity measurements were obtained: RIGHT ICA: 146/32 cm/sec CCA: 14/78 cm/sec SYSTOLIC ICA/CCA RATIO:  1.6 ECA: 85 cm/sec LEFT ICA: 137/26 cm/sec CCA: 295/62 cm/sec SYSTOLIC ICA/CCA RATIO:  1.2 ECA: 102 cm/sec RIGHT CAROTID ARTERY: Scattered moderate partially calcified right carotid bifurcation atherosclerosis. Despite this, there is no hemodynamically significant right ICA stenosis, velocity elevation, or turbulent flow. Degree of narrowing estimated at less than 50%. RIGHT VERTEBRAL ARTERY:  Antegrade LEFT CAROTID ARTERY: Similar scattered moderate left carotid bifurcation atherosclerosis. Despite this, there is no hemodynamically significant left ICA stenosis, velocity elevation, turbulent flow. Degree of narrowing also less than 50%. LEFT VERTEBRAL ARTERY:  Antegrade. Included views of the left internal jugular vein demonstrates hypoechoic intraluminal thrombus appearing acute and nearly occlusive. Patient does have  a tunneled hemodialysis catheter on this side as well. IMPRESSION: Moderate bilateral carotid atherosclerosis. No hemodynamically significant ICA stenosis. Degree of narrowing less than 50% bilaterally by ultrasound criteria. Patent antegrade vertebral flow bilaterally Left internal jugular vein thrombosis, suspect related to tunneled left hemo dialysis catheter. Electronically Signed   By: Jerilynn Mages.  Shick M.D.   On: 03/14/2018 08:17   Dg Chest Port 1 View  Result Date: 03/15/2018 CLINICAL DATA:  Atelectasis, history coronary artery disease post CABG and MI, end-stage renal disease, hypertension EXAM: PORTABLE CHEST 1 VIEW COMPARISON:  Portable exam 0424 hours compared to 03/14/2018 FINDINGS: LEFT jugular dual-lumen central venous catheter with tip projecting over RIGHT atrium. Enlargement of cardiac silhouette post CABG. Sub pulmonary vascular congestion. Perihilar infiltrates favor pulmonary edema. Atherosclerotic calcification aorta. Tiny RIGHT pleural effusion. No pneumothorax. IMPRESSION: Mild pulmonary edema with tiny RIGHT pleural effusion. Electronically Signed   By: Lavonia Dana M.D.   On: 03/15/2018 07:10     Medications:   . norepinephrine (LEVOPHED) Adult infusion Stopped (03/15/18 0000)   . apixaban  2.5 mg Oral BID  . aspirin EC  81 mg Oral Daily  . atorvastatin  80 mg Oral QHS  . brimonidine  1 drop  Both Eyes TID  . carvedilol  6.25 mg Oral BID  . Chlorhexidine Gluconate Cloth  6 each Topical Q0600  . docusate sodium  100 mg Oral BID  . epoetin (EPOGEN/PROCRIT) injection  10,000 Units Intravenous Q T,Th,Sa-HD  . erythromycin   Left Eye Q6H  . famotidine  20 mg Oral Daily  . feeding supplement (NEPRO CARB STEADY)  237 mL Oral BID BM  . feeding supplement (PRO-STAT SUGAR FREE 64)  30 mL Oral TID  . folic acid  1 mg Oral Daily  . gabapentin  100 mg Oral QHS  . hydrALAZINE  25 mg Oral Q8H  . isosorbide mononitrate  30 mg Oral Daily  . mirtazapine  7.5 mg Oral QHS  . multivitamin  1  tablet Oral Daily  . omega-3 acid ethyl esters  1 capsule Oral Daily  . polyethylene glycol  17 g Oral Daily  . polyvinyl alcohol  1 drop Both Eyes BID  . sevelamer carbonate  800 mg Oral TID WC  . thiamine  100 mg Oral Daily  . timolol  1 drop Both Eyes TID  . vitamin C  500 mg Oral BID   acetaminophen **OR** acetaminophen, hydrALAZINE, LORazepam **OR** LORazepam, morphine injection, ondansetron **OR** ondansetron (ZOFRAN) IV, oxyCODONE, sodium chloride  Assessment/ Plan:  Ms. Shravya Wickwire is a 67 y.o. 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  [facility] TTS hemodialysis RIJ permcath  Brief hospital course. Patient was admitted on 03/08/2018 from [facility] with eye pain and hypertensive emergency. Emergent hemodialysis treatment following morning for volume overload and blood pressure control. Patient's left eye was found to have vitreous hemorrhage. Hemodialysis on Tuesday, regularly scheduled. Blood pressure remained elevated. Angiogram on Wednesday, determined unsalvagable left lower extremity. Dr. Lucky Cowboy did left BKA on 11/20. Patient was having severe pain post procedure. Friday, patient was noticed having episodes of loss of consciousness. Thought at that time to be hypoglycemic events. However they continued to Saturday where she seemed to have aspirated and prolonged episode of loss of consciousness. Moved to ICU. MRI found to be negative. Hemodialysis Saturday night with hypotension requiring norepinephrine and fluid boluses. Patient then had witness loss of conscious episode witnessed by the dialysis nurse and me.   1. End Stage Renal Disease with crackles on examination TTS schedule Unfortunately, unable to get ultrafiltration on hemodialysis yesterday.  No  acute indication for dialysis at this time. Will monitor for dialysis need.   2. 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 Lucky Cowboy.  Completed course of Cefazolin - end date was 11/22.  Repeat echocardiogram pending.   3. Anemia of chronic kidney disease:  Hemoglobin 8.6 - EPO 10000 units with hemodialysis treatment TTS  4. Hypertension: Now with Hypotension - required vasopressors with hemodialysis treatment yesterday.  - Disconitnue hydralazine and isosorbide - Continue carvedilol for now.   5. Secondary Hyperparathyroidism: holding sevelamer due to hypophosphatemia on admission. Current phosphorus 4.9 and calcium 8.5 - at goal  6. Syncope: concern for seizures. Loss of consciousness episodes. Discussed case with Dr. Irish Elders. MRI reviewed.  - WIll start patient on levitiracetam  - EEG as per neurology.    LOS: 7 Kineta Fudala 11/24/20198:59 AM

## 2018-03-15 NOTE — Progress Notes (Signed)
Md ordered IV Keppra until patient is able to take oral medications.

## 2018-03-15 NOTE — Progress Notes (Addendum)
Name: Stephanie Ellis MRN: [id number] DOB: Sep 08, 1950     CONSULTATION DATE: 03/08/2018 Subjective & objectives: Nonsustained V. tach.  Did not tolerate ultrafiltration was RRT last night.  PAST MEDICAL HISTORY :   has a past medical history of (HFpEF) heart failure with preserved ejection fraction ([place]), Anemia, Anorexia, Bacteremia due to Pseudomonas, Carotid arterial disease ([place]), Complication of anesthesia, Coronary artery disease involving left main coronary artery (01/2015), ESRD (end stage renal disease) on dialysis [facility]), Essential hypertension, GERD (gastroesophageal reflux disease), Heart murmur, Hypercholesterolemia, Myocardial infarction ([place]) (2016), PVD (peripheral vascular disease) ([place]), S/P CABG x 3 (03/24/2015), Sinusitis (2019), and Type II diabetes mellitus with complication ([place]).  has a past surgical history that includes Abdominal hysterectomy; Insertion of dialysis catheter; Cardiac catheterization (01/2015); transthoracic echocardiogram (January 2017); Cataract extraction w/PHACO (Left, 01/18/2016); Coronary artery bypass graft (03/28/15); Coronary angioplasty; AV fistula placement (Left, 05/30/2016); Upper Extremity Angiography (Left, 06/27/2016); Colonoscopy; Cataract extraction w/PHACO (Right, 08/01/2016); Insertion express tube shunt (Right, 08/01/2016); Insertion of ahmed valve (Left, 08/15/2016); Upper Extremity Angiography (Left, 08/22/2016); Esophagogastroduodenoscopy (egd) with propofol (N/A, 10/24/2016); Arteriovenous graft placement (05/2016); Eye surgery (Bilateral); Eye surgery; CAPD insertion (N/A, 10/30/2017); DIALYSIS/PERMA CATHETER REMOVAL (Right, 02/04/2018); Amputation toe (Left, 02/06/2018); TEMPORARY DIALYSIS CATHETER (N/A, 02/06/2018); DIALYSIS/PERMA CATHETER INSERTION (N/A, 02/11/2018); Lower Extremity Angiography (Right, 02/19/2018); Amputation (Right, 02/25/2018); Removal of a dialysis catheter (N/A, 02/25/2018); Vascular surgery; Lower Extremity Angiography (Left,  01/30/2018); and Amputation (Left, 03/11/2018). Prior to Admission medications   Medication Sig Start Date End Date Taking? Authorizing Provider  apixaban (ELIQUIS) 2.5 MG TABS tablet Take 1 tablet (2.5 mg total) by mouth 2 (two) times daily. 02/15/18  Yes Vaughan Basta, MD  aspirin EC 81 MG tablet Take 1 tablet (81 mg total) by mouth daily. 10/19/16  Yes Dustin Flock, MD  atorvastatin (LIPITOR) 80 MG tablet Take 80 mg by mouth at bedtime.  05/05/17  Yes [provider]  calcitRIOL (ROCALTROL) 0.5 MCG capsule Take 0.5 mcg by mouth daily.   Yes [provider]  carvedilol (COREG) 3.125 MG tablet Take 3.125 mg by mouth 2 (two) times daily.    Yes [provider]  ceFAZolin (ANCEF) IVPB Indication:  Osteomyelitis/Tricuspic Endocarditis Last Day of Therapy:  03/13/18 Labs - Once weekly:  CBC/D and CMP, 02/17/18 03/13/18 Yes Vaughan Basta, MD  docusate sodium (COLACE) 100 MG capsule Take 1 capsule (100 mg total) by mouth 2 (two) times daily. 02/15/18  Yes Vaughan Basta, MD  folic acid (FOLVITE) 1 MG tablet Take 1 tablet (1 mg total) by mouth daily. 02/15/18 02/15/19 Yes Vaughan Basta, MD  insulin glargine (LANTUS) 100 unit/mL SOPN Inject 5 Units into the skin at bedtime.   Yes [provider]  isosorbide mononitrate (IMDUR) 30 MG 24 hr tablet Take 30 mg by mouth daily.    Yes [provider]  midodrine (PROAMATINE) 10 MG tablet Take 1 tablet (10 mg total) by mouth 3 (three) times a week. Patient taking differently: Take 10 mg by mouth 3 (three) times a week. Take Tuesday, Thursday, and Saturday 02/17/18  Yes Vaughan Basta, MD  mirtazapine (REMERON) 7.5 MG tablet Take 7.5 mg by mouth at bedtime.   Yes [provider]  Multiple Vitamin (MULTIVITAMIN WITH MINERALS) TABS tablet Take 2 tablets by mouth daily. Gummy vitamins   Yes [provider]  Multiple Vitamins-Minerals (PRORENAL + D) TABS Take 1  tablet by mouth daily.   Yes [provider]  nitroGLYCERIN (NITROSTAT) 0.4 MG SL tablet Place 1 tablet (0.4 mg  total) under the tongue every 5 (five) minutes as needed for chest pain. 09/05/16  Yes Wieting, Richard, MD  nizatidine (AXID) 150 MG capsule Take 1 capsule (150 mg total) by mouth 2 (two) times daily. 02/15/18  Yes Vaughan Basta, MD  Nutritional Supplements (FEEDING SUPPLEMENT, NEPRO CARB STEADY,) LIQD Take 237 mLs by mouth 2 (two) times daily between meals. 02/15/18  Yes Vaughan Basta, MD  Omega-3 300 MG CAPS Take 2 capsules by mouth daily.   Yes [provider]  ondansetron (ZOFRAN) 4 MG tablet Take 4 mg by mouth every 8 (eight) hours as needed for nausea or vomiting.   Yes [provider]  oxyCODONE (OXY IR/ROXICODONE) 5 MG immediate release tablet Take 1 tablet (5 mg total) by mouth every 6 (six) hours as needed for moderate pain or severe pain. 02/28/18  Yes Fritzi Mandes, MD  Polyethyl Glycol-Propyl Glycol 0.4-0.3 % SOLN Place 1 drop into both eyes 2 (two) times daily.   Yes [provider]  sevelamer carbonate (RENVELA) 800 MG tablet Take 800 mg by mouth 3 (three) times daily with meals.   Yes [provider]  vitamin B-12 (CYANOCOBALAMIN) 1000 MCG tablet Take 1 tablet (1,000 mcg total) by mouth daily. Patient taking differently: Take 500 mcg by mouth daily.  02/15/18  Yes Vaughan Basta, MD   Allergies  Allergen Reactions  . Chlorthalidone Anaphylaxis, Itching and Rash  . Fentanyl Rash  . Midazolam Rash  . Ace Inhibitors Other (See Comments)    Reaction:  Hyperkalemia, agitation   . Angiotensin Receptor Blockers Other (See Comments)    Reaction:  Hyperkalemia, agitation   . Norvasc [Amlodipine] Itching and Rash  . Phenergan [Promethazine Hcl] Anxiety    "antsy, can't sit still"    FAMILY HISTORY:  family history includes Diabetes Mellitus II in her mother; Heart failure in her mother; Pancreatic cancer in  her father. SOCIAL HISTORY:  reports that she has never smoked. She has never used smokeless tobacco. She reports that she does not drink alcohol or use drugs.  REVIEW OF SYSTEMS:   Unable to obtain due to critical illness   VITAL SIGNS: Temp:  [97.9 F (36.6 C)-98.5 F (36.9 C)] 97.9 F (36.6 C) (11/24 0900) Pulse Rate:  [60-89] 60 (11/24 0900) Resp:  [0-24] 13 (11/24 0900) BP: (85-249)/(38-128) 108/38 (11/24 0900) SpO2:  [68 %-100 %] 99 % (11/24 0900) Weight:  [56 kg-57.6 kg] 57.6 kg (11/24 0015)  Physical Examination:  Somnolent, verbally arousable, disoriented to time and no acute focal motor deficits.  Detailed neuro exam as per neurology Tolerating nasal cannula 2 L/min, no distress, able to talk in full sentences, bilateral equal air entry no adventitious sounds S1 & S2 audible with no murmur Benign abdominal exam with a normal processes Bilateral below-knee amputation was dressed left amputation stump Lt IJ tunneled cath for dialysis access   ASSESSMENT / PLAN:  Altered mental status with confusion and brief episodes of unresponsiveness.  No acute abnormalities on MRI.  Carotid ultrasound moderate bilateral carotid atherosclerosis and no hemodynamically significant ICA stenosis. possible toxic metabolic encephalopathy -Monitor neuro status and management as per neurology. Plan for EEG  Acute respiratory failure tolerating nasal cannula -Monitor O2 sat and work of breathing  Atelectasis.  worsening Bibasilar airspace disease, Lt. Pl eff and congestion (likely volume overload with being intolerant to ultrafiltration) -Monitor CXR + CBC + FiO2 -Empiric Cefep. Procalcitonin 1.49 to 2.59 -RRT per renal to improve lung compliance  Tricuspid valve endocarditis with MSSA  bacteremia completed cefazolin on 03/13/2018.  Echo 02/04/2018 LVEF 50 to 55%, regional wall abnormalities cannot be excluded, grade 1 diastolic dysfunction and tricuspid valve vegetation *ECHO  03/15/2018 LVEF 40-45%, diffuse hypokinesis, grade III diastolic dysfunction and can not exclude wall motion abnormality  Nonsustained VT. H/o CAD s/p PCI and CABG -Monitor electrolytes, Trop and EKG  ESRD on HD and secondary hyperparathyroidism -Management as per renal  Peripheral vascular disease status post bilateral below-knee amputation  Diabetes mellitus -Optimize glycemic control  Hypertension -Optimize antihypertensives and monitor hemodynamics  Anemia -Keep hemoglobin more than 7 g/dL  DNR  DVT & GI prophylaxis.  Continue with supportive care Family as updated at the bedside (daughter and grand daughter) and they agreed to the plan of care Critical care time 40 minutes

## 2018-03-15 NOTE — Progress Notes (Signed)
Pharmacy Antibiotic Note  Stephanie Ellis is a 67 y.o. female admitted on 03/08/2018 with Endocarditis. Patient has ESRD and is on HD TTS. She has just completed a 6-week course of IV Ancef for MSSA tricuspid endocarditis. Pharmacy has been consulted for cefepime dosing for possible newly acquired PNA. PCT increased from 1.49 to 2.59, WBC remains elevated.  Plan: Begin cefepime 1 gram now, then 596m every 24 hours  Height: 5' 4"  (162.6 cm) Weight: 126 lb 15.8 oz (57.6 kg) IBW/kg (Calculated) : 54.7  Temp (24hrs), Avg:98.2 F (36.8 C), Min:97.9 F (36.6 C), Max:98.5 F (36.9 C)  Recent Labs  Lab 03/10/18 0931 03/11/18 0457 03/12/18 0950 03/13/18 0313 03/13/18 1815 03/14/18 1029 03/14/18 1808 03/14/18 2123 03/14/18 2302 03/15/18 0652  WBC 9.4 10.4 12.0* 14.1*  --  16.6*  --   --  16.3* 12.9*  CREATININE 5.07* 3.86*  --   --  5.08* 6.42*  --  7.18*  --   --   LATICACIDVEN  --   --   --   --   --   --  2.3* 0.9 0.8  --     Estimated Creatinine Clearance: 6.6 mL/min (A) (by C-G formula based on SCr of 7.18 mg/dL (H)).    Antimicrobials this admission: Cefepime 11/24 >> 11/17 ancef >> 11/23  Dose adjustments this admission: Patient is on HD.  Microbiology results: 11/23 BCx pending 10/30 BCx NGF MRSA PCR (-)   Thank you for allowing pharmacy to be a part of this patient's care.  RDallie Piles PharmD 03/15/2018 12:48 PM

## 2018-03-15 NOTE — Progress Notes (Signed)
[facility] [place] NAME: Stephanie Ellis    MR#:  [id number]  DATE OF BIRTH:  31-Oct-1950  SUBJECTIVE:  S/p amputation on  03/11/18 Patient is still having intermittent episodes of delirious versus brief epiepisodes of unresponsiveness.  Choked on food last night and had CODE BLUE, foot was manually dislodged and patient was transferred to stepdown unit   no history of seizures in the past  REVIEW OF SYSTEMS:    Review of Systems  Patient is very lethargic today during my examination unable to get review of systems     DRUG ALLERGIES:   Allergies  Allergen Reactions  . Chlorthalidone Anaphylaxis, Itching and Rash  . Fentanyl Rash  . Midazolam Rash  . Ace Inhibitors Other (See Comments)    Reaction:  Hyperkalemia, agitation   . Angiotensin Receptor Blockers Other (See Comments)    Reaction:  Hyperkalemia, agitation   . Norvasc [Amlodipine] Itching and Rash  . Phenergan [Promethazine Hcl] Anxiety    "antsy, can't sit still"    VITALS:  Blood pressure (!) 116/41, pulse 60, temperature 97.9 F (36.6 C), temperature source Oral, resp. rate 10, height 5' 4"  (1.626 m), weight 57.6 kg, SpO2 100 %.  PHYSICAL EXAMINATION:   Constitutional: Appears well-developed and well-nourished. No distress. HENT: Normocephalic. Marland Kitchen Oropharynx is clear and moist.  Eyes: Conjunctivae and EOM are normal. PERRLA, no scleral icterus.  Neck: Normal ROM. Neck supple. No JVD. No tracheal deviation. CVS: RRR, S1/S2 +, no murmurs, no gallops, no carotid bruit.  Pulmonary: Effort and breath sounds normal, no stridor, rhonchi, wheezes, rales.  Abdominal: Soft. BS +,  no distension, tenderness, rebound or guarding.  Musculoskeletal: Status post right BKA, left BKA recent with dressing. Neuro: Lethargic but arousable  No focal deficits. Skin: Skin is warm and dry. No rash noted.  On her back she has dark pigmentation in the central half of the back ,but it does  not look inflamed or infective, appears kind of chronic dark discoloration. Psychiatric: Normal mood and affect.    LABORATORY PANEL:   CBC Recent Labs  Lab 03/15/18 0652  WBC 12.9*  HGB 8.6*  HCT 28.9*  PLT 354   ------------------------------------------------------------------------------------------------------------------  Chemistries  Recent Labs  Lab 03/15/18 1236  NA 136  K 4.3  CL 97*  CO2 28  GLUCOSE 85  BUN 22  CREATININE 4.85*  CALCIUM 8.2*  MG 2.0   ------------------------------------------------------------------------------------------------------------------  Cardiac Enzymes Recent Labs  Lab 03/10/18 1559 03/10/18 2221 03/14/18 2302  TROPONINI 0.04* 0.04* 0.21*   ------------------------------------------------------------------------------------------------------------------  RADIOLOGY:  Dg Chest 2 View  Result Date: 03/14/2018 CLINICAL DATA:  Hypoxia EXAM: CHEST - 2 VIEW COMPARISON:  03/08/2018 and prior radiographs FINDINGS: Mild cardiomegaly and CABG changes again noted. A LEFT IJ central venous catheter with tip overlying the SUPERIOR cavoatrial junction again noted. Decreased pulmonary vascular congestion and improved bibasilar aeration noted. Trace bilateral pleural effusions are present. No pneumothorax. IMPRESSION: Decreased pulmonary vascular congestion and improved bibasilar aeration. No other significant change. Electronically Signed   By: Margarette Canada M.D.   On: 03/14/2018 09:08   Ct Head Wo Contrast  Result Date: 03/13/2018 CLINICAL DATA:  67 year old female with possible seizure like activity. Unresponsiveness. EXAM: CT HEAD WITHOUT CONTRAST TECHNIQUE: Contiguous axial images were obtained from the base of the skull through the vertex without intravenous contrast. COMPARISON:  Head CT 09/27/2017. FINDINGS: Brain: Patchy areas of decreased attenuation are noted throughout the deep and periventricular white matter  of the cerebral  hemispheres bilaterally, compatible with mild chronic microvascular ischemic disease. No evidence of acute infarction, hemorrhage, hydrocephalus, extra-axial collection or mass lesion/mass effect. Vascular: No hyperdense vessel or unexpected calcification. Skull: Normal. Negative for fracture or focal lesion. Sinuses/Orbits: Complete opacification of the left sphenoid sinus. Areas of mucosal thickening in the posterior left ethmoidal sinuses and right sphenoid sinus. Other: None. IMPRESSION: 1. No acute intracranial abnormalities. 2. Mild chronic microvascular ischemic changes in the cerebral white matter, as above. 3. Chronic paranasal sinus disease, slightly worsened compared to the prior study, as above. Electronically Signed   By: Vinnie Langton M.D.   On: 03/13/2018 20:37   Mr Brain Wo Contrast  Result Date: 03/14/2018 CLINICAL DATA:  Intermittent delirium versus unresponsiveness. History of septic emboli to extremities, end-stage renal disease on dialysis, hypertension, and hypercholesterolemia. EXAM: MRI HEAD WITHOUT CONTRAST TECHNIQUE: Multiplanar, multiecho pulse sequences of the brain and surrounding structures were obtained without intravenous contrast. COMPARISON:  MRI head November 15, 2017 and CT HEAD March 13, 2018. FINDINGS: INTRACRANIAL CONTENTS: No reduced diffusion to suggest acute ischemia or hypercellular tumor. LEFT temporal chronic microhemorrhage. Mild parenchymal brain volume loss. Old small LEFT cerebellar infarct. Patchy supratentorial white matter FLAIR T2 hyperintensities. No hydrocephalus. No suspicious parenchymal signal, masses, mass effect. No abnormal extra-axial fluid collections. No extra-axial masses. VASCULAR: Normal major intracranial vascular flow voids present at skull base. SKULL AND UPPER CERVICAL SPINE: No abnormal sellar expansion. No suspicious calvarial bone marrow signal. Craniocervical junction maintained. SINUSES/ORBITS: LEFT mastoid effusion. Chronic severe  LEFT sphenoid sinusitis. Status post bilateral ocular lens implants.The included ocular globes and orbital contents are non-suspicious. Bilateral glaucoma drainage device versus scleral banding. OTHER: None. IMPRESSION: 1. No acute intracranial process. 2. Stable mild chronic small vessel ischemic changes and old small LEFT cerebellar infarct. 3. Mild parenchymal brain volume loss. Electronically Signed   By: Elon Alas M.D.   On: 03/14/2018 17:53   US Carotid Bilateral  Result Date: 03/14/2018 CLINICAL DATA:  Cerebral infarct, hypertension, hyperlipidemia EXAM: BILATERAL CAROTID DUPLEX ULTRASOUND TECHNIQUE: Pearline Cables scale imaging, color Doppler and duplex ultrasound were performed of bilateral carotid and vertebral arteries in the neck. COMPARISON:  None. FINDINGS: Criteria: Quantification of carotid stenosis is based on velocity parameters that correlate the residual internal carotid diameter with NASCET-based stenosis levels, using the diameter of the distal internal carotid lumen as the denominator for stenosis measurement. The following velocity measurements were obtained: RIGHT ICA: 146/32 cm/sec CCA: 81/44 cm/sec SYSTOLIC ICA/CCA RATIO:  1.6 ECA: 85 cm/sec LEFT ICA: 137/26 cm/sec CCA: 818/56 cm/sec SYSTOLIC ICA/CCA RATIO:  1.2 ECA: 102 cm/sec RIGHT CAROTID ARTERY: Scattered moderate partially calcified right carotid bifurcation atherosclerosis. Despite this, there is no hemodynamically significant right ICA stenosis, velocity elevation, or turbulent flow. Degree of narrowing estimated at less than 50%. RIGHT VERTEBRAL ARTERY:  Antegrade LEFT CAROTID ARTERY: Similar scattered moderate left carotid bifurcation atherosclerosis. Despite this, there is no hemodynamically significant left ICA stenosis, velocity elevation, turbulent flow. Degree of narrowing also less than 50%. LEFT VERTEBRAL ARTERY:  Antegrade. Included views of the left internal jugular vein demonstrates hypoechoic intraluminal thrombus  appearing acute and nearly occlusive. Patient does have a tunneled hemodialysis catheter on this side as well. IMPRESSION: Moderate bilateral carotid atherosclerosis. No hemodynamically significant ICA stenosis. Degree of narrowing less than 50% bilaterally by ultrasound criteria. Patent antegrade vertebral flow bilaterally Left internal jugular vein thrombosis, suspect related to tunneled left hemo dialysis catheter. Electronically Signed   By: Jerilynn Mages.  Shick M.D.  On: 03/14/2018 08:17   Dg Chest Port 1 View  Result Date: 03/15/2018 CLINICAL DATA:  Atelectasis, history coronary artery disease post CABG and MI, end-stage renal disease, hypertension EXAM: PORTABLE CHEST 1 VIEW COMPARISON:  Portable exam 0424 hours compared to 03/14/2018 FINDINGS: LEFT jugular dual-lumen central venous catheter with tip projecting over RIGHT atrium. Enlargement of cardiac silhouette post CABG. Sub pulmonary vascular congestion. Perihilar infiltrates favor pulmonary edema. Atherosclerotic calcification aorta. Tiny RIGHT pleural effusion. No pneumothorax. IMPRESSION: Mild pulmonary edema with tiny RIGHT pleural effusion. Electronically Signed   By: Lavonia Dana M.D.   On: 03/15/2018 07:10     ASSESSMENT AND PLAN:   67 y.o. 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  1.    Peripheral arterial disease in bilateral lower extremities due to Diabetes  Left foot second and third toes with gangrene Nonsalvageable left lower extremity per podiatry Vascular surgery had done left BKA 03/11/18 -Plan to change dressing in OR on Monday   2.    Intermittent  episodes of unresponsiveness Rule out seizures EEG pending Neurology consult placed and discussed with Dr. Irish Elders Keppra trial 100 before hemodialysis and 500 post hemodialysis CT scan headis negative Carotid Dopplers with no significant stenosis MRI of the brain with contrast could not be ordered as patient is a dialysis patient Neurochecks  3.  Diabetes mellitus with complications of PAD and CKD: Continue ADA diet with Lantus and sliding scale As now had hypoglycemia, stopped her Lantus and kept on glucose check without any coverage.  4.  Left eye blurred vision-reporting purulent discharge today  appreciate ophthalmology input in the ED.  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  She had prior laser capsulotomy. Eyedrops recommended at this time.  She will need pars plano vitrectomy-recommended outpatient follow-up after discharge. Add erythromycin ointment to the eye.  5. Acute hypoxic respiratory failure due to pulmonary edema from end-stage renal disease needing dialysis:  Clinically better. Status post hemodialysis. oxygen to be weaned off.  6.  End-stage renal disease with fluid overload Uric acid level is in the normal range on hemodialysis. Patient could not tolerate hemodialysis yesterday became hypotensive and fluid boluses were given Follow-up with nephrology  7.    History of tricuspid endocarditis-with MSSA.  Currently on cefazolin with dialysis.  Continue until 03/13/2018. May stop tomorrow.  8.  Hypertensive urgency resolved Hydralazine is added to the regimen titrate as needed  9 hypoglycemia Discontinued Lantus and on glucose check to avoid hypoglycemia. Also allowed to add some salt to her food to have some nutrition.  D/w dr Abigail Butts, neurology Dr. Irish Elders and intensivist   Management plans discussed with the patient and she is in agreement.  CODE STATUS: full  TOTAL TIME TAKING CARE OF THIS PATIENT: 35 minutes.  During the  rapid response seen the patient and spoke to her one daughter in the room and the other on the phone.  POSSIBLE D/C 2-3 days, DEPENDING ON CLINICAL CONDITION.   [name] M.D on 03/15/2018 at 2:13 PM  Between 7am to 6pm - Pager - [phone] After 6pm go to [email or web address] - password [email or web address]  [employer]  Office  [phone]  CC: Primary care physician; No primary care provider on file.  Note: This dictation was prepared with Dragon dictation along with smaller phrase technology. Any transcriptional errors that result from this process are unintentional.

## 2018-03-16 ENCOUNTER — Inpatient Hospital Stay: Payer: Medicare Other

## 2018-03-16 LAB — GLUCOSE, CAPILLARY
Glucose-Capillary: 125 mg/dL — ABNORMAL HIGH (ref 70–99)
Glucose-Capillary: 126 mg/dL — ABNORMAL HIGH (ref 70–99)
Glucose-Capillary: 136 mg/dL — ABNORMAL HIGH (ref 70–99)
Glucose-Capillary: 137 mg/dL — ABNORMAL HIGH (ref 70–99)
Glucose-Capillary: 159 mg/dL — ABNORMAL HIGH (ref 70–99)
Glucose-Capillary: 183 mg/dL — ABNORMAL HIGH (ref 70–99)
Glucose-Capillary: 218 mg/dL — ABNORMAL HIGH (ref 70–99)
Glucose-Capillary: 62 mg/dL — ABNORMAL LOW (ref 70–99)
Glucose-Capillary: 85 mg/dL (ref 70–99)

## 2018-03-16 LAB — PROCALCITONIN: Procalcitonin: 3.11 ng/mL

## 2018-03-16 MED ORDER — DEXTROSE 50 % IV SOLN
INTRAVENOUS | Status: AC
  Filled 2018-03-16: qty 50

## 2018-03-16 MED ORDER — DEXTROSE 50 % IV SOLN
50.0000 mL | Freq: Once | INTRAVENOUS | Status: AC
  Administered 2018-03-16: 50 mL via INTRAVENOUS

## 2018-03-16 MED ORDER — SODIUM CHLORIDE 0.9 % IV SOLN
1.0000 g | INTRAVENOUS | Status: DC
  Administered 2018-03-16 – 2018-03-17 (×2): 1 g via INTRAVENOUS
  Filled 2018-03-16 (×3): qty 1

## 2018-03-16 NOTE — Progress Notes (Addendum)
Name: Stephanie Ellis MRN: [id number] DOB: 1950/09/16     CONSULTATION DATE: 03/08/2018  Subjective & objectives: Improved mental status.  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  has a past surgical history that includes Abdominal hysterectomy; Insertion of dialysis catheter; Cardiac catheterization (01/2015); transthoracic echocardiogram (January 2017); Cataract extraction w/PHACO (Left, 01/18/2016); Coronary artery bypass graft (03/28/15); Coronary angioplasty; AV fistula placement (Left, 05/30/2016); Upper Extremity Angiography (Left, 06/27/2016); Colonoscopy; Cataract extraction w/PHACO (Right, 08/01/2016); Insertion express tube shunt (Right, 08/01/2016); Insertion of ahmed valve (Left, 08/15/2016); Upper Extremity Angiography (Left, 08/22/2016); Esophagogastroduodenoscopy (egd) with propofol (N/A, 10/24/2016); Arteriovenous graft placement (05/2016); Eye surgery (Bilateral); Eye surgery; CAPD insertion (N/A, 10/30/2017); DIALYSIS/PERMA CATHETER REMOVAL (Right, 02/04/2018); Amputation toe (Left, 02/06/2018); TEMPORARY DIALYSIS CATHETER (N/A, 02/06/2018); DIALYSIS/PERMA CATHETER INSERTION (N/A, 02/11/2018); Lower Extremity Angiography (Right, 02/19/2018); Amputation (Right, 02/25/2018); Removal of a dialysis catheter (N/A, 02/25/2018); Vascular surgery; Lower Extremity Angiography (Left, 01/30/2018); and Amputation (Left,  03/11/2018). Prior to Admission medications   Medication Sig Start Date End Date Taking? Authorizing Provider  apixaban (ELIQUIS) 2.5 MG TABS tablet Take 1 tablet (2.5 mg total) by mouth 2 (two) times daily. 02/15/18  Yes Vaughan Basta, MD  aspirin EC 81 MG tablet Take 1 tablet (81 mg total) by mouth daily. 10/19/16  Yes Dustin Flock, MD  atorvastatin (LIPITOR) 80 MG tablet Take 80 mg by mouth at bedtime.  05/05/17  Yes [provider]  calcitRIOL (ROCALTROL) 0.5 MCG capsule Take 0.5 mcg by mouth daily.   Yes [provider]  carvedilol (COREG) 3.125 MG tablet Take 3.125 mg by mouth 2 (two) times daily.    Yes [provider]  ceFAZolin (ANCEF) IVPB Indication:  Osteomyelitis/Tricuspic Endocarditis Last Day of Therapy:  03/13/18 Labs - Once weekly:  CBC/D and CMP, 02/17/18 03/13/18 Yes Vaughan Basta, MD  docusate sodium (COLACE) 100 MG capsule Take 1 capsule (100 mg total) by mouth 2 (two) times daily. 02/15/18  Yes Vaughan Basta, MD  folic acid (FOLVITE) 1 MG tablet Take 1 tablet (1 mg total) by mouth daily. 02/15/18 02/15/19 Yes Vaughan Basta, MD  insulin glargine (LANTUS) 100 unit/mL SOPN Inject 5 Units into the skin at bedtime.   Yes [provider]  isosorbide mononitrate (IMDUR) 30 MG 24 hr tablet Take 30 mg by mouth daily.    Yes [provider]  midodrine (PROAMATINE) 10 MG tablet Take 1 tablet (10 mg total) by mouth 3 (three) times a week. Patient taking differently: Take 10 mg by mouth 3 (three) times a week. Take Tuesday, Thursday, and Saturday 02/17/18  Yes Vaughan Basta, MD  mirtazapine (REMERON) 7.5 MG tablet Take 7.5 mg by mouth at bedtime.   Yes [provider]  Multiple Vitamin (MULTIVITAMIN WITH MINERALS) TABS tablet Take 2 tablets by mouth daily. Gummy vitamins   Yes [provider]  Multiple Vitamins-Minerals (PRORENAL + D) TABS Take 1 tablet by mouth daily.   Yes  [provider]  nitroGLYCERIN (NITROSTAT) 0.4 MG SL tablet Place 1 tablet (0.4 mg total) under the tongue every 5 (five) minutes  as needed for chest pain. 09/05/16  Yes Wieting, Richard, MD  nizatidine (AXID) 150 MG capsule Take 1 capsule (150 mg total) by mouth 2 (two) times daily. 02/15/18  Yes Vaughan Basta, MD  Nutritional Supplements (FEEDING SUPPLEMENT, NEPRO CARB STEADY,) LIQD Take 237 mLs by mouth 2 (two) times daily between meals. 02/15/18  Yes Vaughan Basta, MD  Omega-3 300 MG CAPS Take 2 capsules by mouth daily.   Yes [provider]  ondansetron (ZOFRAN) 4 MG tablet Take 4 mg by mouth every 8 (eight) hours as needed for nausea or vomiting.   Yes [provider]  oxyCODONE (OXY IR/ROXICODONE) 5 MG immediate release tablet Take 1 tablet (5 mg total) by mouth every 6 (six) hours as needed for moderate pain or severe pain. 02/28/18  Yes Fritzi Mandes, MD  Polyethyl Glycol-Propyl Glycol 0.4-0.3 % SOLN Place 1 drop into both eyes 2 (two) times daily.   Yes [provider]  sevelamer carbonate (RENVELA) 800 MG tablet Take 800 mg by mouth 3 (three) times daily with meals.   Yes [provider]  vitamin B-12 (CYANOCOBALAMIN) 1000 MCG tablet Take 1 tablet (1,000 mcg total) by mouth daily. Patient taking differently: Take 500 mcg by mouth daily.  02/15/18  Yes Vaughan Basta, MD   Allergies  Allergen Reactions  . Chlorthalidone Anaphylaxis, Itching and Rash  . Fentanyl Rash  . Midazolam Rash  . Ace Inhibitors Other (See Comments)    Reaction:  Hyperkalemia, agitation   . Angiotensin Receptor Blockers Other (See Comments)    Reaction:  Hyperkalemia, agitation   . Norvasc [Amlodipine] Itching and Rash  . Phenergan [Promethazine Hcl] Anxiety    "antsy, can't sit still"    FAMILY HISTORY:  family history includes Diabetes Mellitus II in her mother; Heart failure in her mother; Pancreatic cancer in her father. SOCIAL  HISTORY:  reports that she has never smoked. She has never used smokeless tobacco. She reports that she does not drink alcohol or use drugs.  REVIEW OF SYSTEMS:   Unable to obtain due to critical illness   VITAL SIGNS: Temp:  [98.2 F (36.8 C)-98.7 F (37.1 C)] 98.7 F (37.1 C) (11/25 0844) Pulse Rate:  [56-73] 60 (11/25 1400) Resp:  [7-18] 9 (11/25 1400) BP: (115-155)/(40-61) 122/54 (11/25 1400) SpO2:  [94 %-100 %] 100 % (11/25 0600)  Physical Examination: Awake and oriented, no acute focal motor deficits. Detailed neuro exam as per neurology Tolerating nasal cannula 2 L/min, no distress, able to talk in full sentences, bilateral equal air entry no adventitious sounds S1 & S2 audible with no murmur Benign abdominal exam with a normal processes Bilateral below-knee amputation was dressed left amputation stump Lt IJ tunneled cath for dialysis access   ASSESSMENT / PLAN:  Altered mental status (improved). No acute abnormalities on MRI.EEG no seizure activity. Carotid ultrasound moderate bilateral carotid atherosclerosis and no hemodynamically significant ICA stenosis.possible toxic metabolic encephalopathy -Monitor neuro status and management as per neurology. On Keppra  Acute respiratory failure tolerating nasal cannula -Monitor O2 sat and work of breathing  Atelectasis. worsening Bibasilar airspace disease, Lt. Pl eff and congestion (likely volume overload with being intolerant to ultrafiltration) -Monitor CXR + CBC + FiO2 -Empiric Cefep. Procalcitonin 1.49 to 2.59 to 3.11 -RRT per renal to improve lung compliance  Tricuspid valve endocarditis with MSSA bacteremia completed cefazolin on 03/13/2018. Echo 02/04/2018 LVEF 50 to 55%, regional wall abnormalities cannot be excluded, grade 1 diastolic dysfunction and tricuspid valve vegetation *ECHO 03/15/2018 LVEF 40-45%,  diffuse hypokinesis, grade III diastolic dysfunction and can not exclude wall motion  abnormality  Nonsustained VT (resolved). H/o CAD s/p PCI and CABG  ESRD on HD and secondary hyperparathyroidism -Management as per renal  Peripheral vascular disease status post bilateral below-knee amputation. On Eliquis -Vascular follow up ok to discharge  Diabetes mellitus -Optimize glycemic control  Hypertension -Optimize antihypertensives and monitor hemodynamics  Anemia -Keep hemoglobin more than 7 g/dL  DNR  DVT &GI prophylaxis. Continue with supportive care Family as updated at the bedside (daughter) and they agreed to the plan of care Critical care time 40 minutes

## 2018-03-16 NOTE — Progress Notes (Signed)
[facility]  ROUNDING NOTE   Subjective:   Patient more awake today.  Mental status appears to be getting back to normal.  No acute complaints.  Pain control is acceptable.  Underwent EEG today  Objective:  Vital signs in last 24 hours:  Temp:  [98.2 F (36.8 C)-98.7 F (37.1 C)] 98.7 F (37.1 C) (11/25 0844) Pulse Rate:  [56-73] 60 (11/25 1400) Resp:  [7-18] 9 (11/25 1400) BP: (115-155)/(40-61) 122/54 (11/25 1400) SpO2:  [94 %-100 %] 100 % (11/25 0600)  Weight change:  Filed Weights   03/14/18 1651 03/14/18 2029 03/15/18 0015  Weight: 56 kg 56.8 kg 57.6 kg    Intake/Output: I/O last 3 completed shifts: In: 65.8 [I.V.:65.8] Out: -204    Intake/Output this shift:  Total I/O In: 240 [P.O.:240] Out: -   Physical Exam: General: NAD, laying in bed  Head: Normocephalic, atraumatic. Moist oral mucosal membranes  Eyes: Anicteric,   Neck: Supple,  Lungs:   Normal breathing effort, decreased breath sounds at bases  Heart: Regular rate and rhythm  Abdomen:  Soft, nontender,   Extremities:  bilateral BKA - left dressings are clean  Neurologic: Nonfocal, moving all four extremities, alert, able to answer questions  Skin: No lesions  Access: Left IJ permcath    Basic Metabolic Panel: Recent Labs  Lab 03/11/18 0457 03/13/18 1803 03/13/18 1815 03/14/18 1029 03/14/18 2123 03/15/18 1236  NA 142  --  136 139 135 136  K 3.8  --  4.9 4.7 4.6 4.3  CL 99  --  97* 95* 94* 97*  CO2 32  --  26 29 27 28   GLUCOSE 129*  --  143* 121* 207* 85  BUN 21  --  25* 36* 42* 22  CREATININE 3.86*  --  5.08* 6.42* 7.18* 4.85*  CALCIUM 8.6*  --  8.6* 9.0 8.5* 8.2*  MG 1.9 2.1  --   --  2.1 2.0  PHOS  --   --   --  5.3* 4.9* 3.3    Liver Function Tests: Recent Labs  Lab 03/14/18 1029  ALBUMIN 2.2*   No results for input(s): LIPASE, AMYLASE in the last 168 hours. No results for input(s): AMMONIA in the last 168 hours.  CBC: Recent Labs  Lab 03/12/18 0950  03/13/18 0313 03/14/18 1029 03/14/18 2302 03/15/18 0652  WBC 12.0* 14.1* 16.6* 16.3* 12.9*  NEUTROABS  --   --   --  13.8* 9.6*  HGB 7.7* 9.3* 8.6* 8.4* 8.6*  HCT 26.5* 32.0* 29.6* 28.3* 28.9*  MCV 103.5* 103.9* 103.5* 102.9* 102.8*  PLT 351 348 387 357 354    Cardiac Enzymes: Recent Labs  Lab 03/10/18 0931 03/10/18 1559 03/10/18 2221 03/14/18 2302  TROPONINI 0.04* 0.04* 0.04* 0.21*    BNP: Invalid input(s): POCBNP  CBG: Recent Labs  Lab 03/16/18 0048 03/16/18 0403 03/16/18 0738 03/16/18 1148 03/16/18 1623  GLUCAP 125* 136* 25 126* 159*    Microbiology: Results for orders placed or performed during the hospital encounter of 03/08/18  MRSA PCR Screening     Status: None   Collection Time: 03/10/18  8:24 AM  Result Value Ref Range Status   MRSA by PCR NEGATIVE NEGATIVE Final    Comment:        The GeneXpert MRSA Assay (FDA approved for NASAL specimens only), is one component of a comprehensive MRSA colonization surveillance program. It is not intended to diagnose MRSA infection nor to guide or monitor treatment for MRSA infections. Performed  at [facility], [address] [place], [place] [address]   CULTURE, BLOOD (ROUTINE X 2) w Reflex to ID Panel     Status: None (Preliminary result)   Collection Time: 03/14/18  6:08 PM  Result Value Ref Range Status   Specimen Description BLOOD LEFT ANTECUBITAL  Final   Special Requests   Final    BOTTLES DRAWN AEROBIC AND ANAEROBIC Blood Culture results may not be optimal due to an excessive volume of blood received in culture bottles   Culture   Final    NO GROWTH 2 DAYS Performed at [facility], [address], [place], [place] [address]    Report Status PENDING  Incomplete  CULTURE, BLOOD (ROUTINE X 2) w Reflex to ID Panel     Status: None (Preliminary result)   Collection Time: 03/14/18  7:27 PM  Result Value Ref Range Status   Specimen Description BLOOD LEFT ANTECUBITAL  Final    Special Requests BOTTLES DRAWN AEROBIC AND ANAEROBIC  Final   Culture   Final    NO GROWTH 2 DAYS Performed at [facility], [address], [place], [place] [address]    Report Status PENDING  Incomplete    Coagulation Studies: No results for input(s): LABPROT, INR in the last 72 hours.  Urinalysis: No results for input(s): COLORURINE, LABSPEC, PHURINE, GLUCOSEU, HGBUR, BILIRUBINUR, KETONESUR, PROTEINUR, UROBILINOGEN, NITRITE, LEUKOCYTESUR in the last 72 hours.  Invalid input(s): APPERANCEUR    Imaging: Dg Chest Port 1 View  Result Date: 03/15/2018 CLINICAL DATA:  Atelectasis, history coronary artery disease post CABG and MI, end-stage renal disease, hypertension EXAM: PORTABLE CHEST 1 VIEW COMPARISON:  Portable exam 0424 hours compared to 03/14/2018 FINDINGS: LEFT jugular dual-lumen central venous catheter with tip projecting over RIGHT atrium. Enlargement of cardiac silhouette post CABG. Sub pulmonary vascular congestion. Perihilar infiltrates favor pulmonary edema. Atherosclerotic calcification aorta. Tiny RIGHT pleural effusion. No pneumothorax. IMPRESSION: Mild pulmonary edema with tiny RIGHT pleural effusion. Electronically Signed   By: Lavonia Dana M.D.   On: 03/15/2018 07:10     Medications:   . ceFEPime (MAXIPIME) IV    . small volume/piggyback builder Stopped (03/16/18 1524)  . norepinephrine (LEVOPHED) Adult infusion Stopped (03/14/18 2355)   . apixaban  2.5 mg Oral BID  . aspirin EC  81 mg Oral Daily  . atorvastatin  80 mg Oral QHS  . brimonidine  1 drop Both Eyes TID  . carvedilol  6.25 mg Oral BID  . Chlorhexidine Gluconate Cloth  6 each Topical Q0600  . docusate sodium  100 mg Oral BID  . epoetin (EPOGEN/PROCRIT) injection  10,000 Units Intravenous Q T,Th,Sa-HD  . erythromycin   Left Eye Q6H  . famotidine  20 mg Oral Daily  . feeding supplement (NEPRO CARB STEADY)  237 mL Oral BID BM  . feeding supplement (PRO-STAT SUGAR FREE 64)  30 mL Oral TID  .  folic acid  1 mg Oral Daily  . gabapentin  100 mg Oral QHS  . levETIRAcetam  500 mg Oral Daily  . [START ON 03/17/2018] levETIRAcetam  500 mg Oral Once per day on Tue Thu Sat  . mirtazapine  7.5 mg Oral QHS  . multivitamin  1 tablet Oral Daily  . omega-3 acid ethyl esters  1 capsule Oral Daily  . polyethylene glycol  17 g Oral Daily  . polyvinyl alcohol  1 drop Both Eyes BID  . sevelamer carbonate  800 mg Oral TID WC  . thiamine  100 mg Oral Daily  . timolol  1 drop Both Eyes TID  . vitamin C  500 mg Oral BID   acetaminophen **OR** acetaminophen, hydrALAZINE, LORazepam **OR** LORazepam, morphine injection, ondansetron **OR** ondansetron (ZOFRAN) IV, oxyCODONE, sodium chloride  Assessment/ Plan:  Ms. Jamera Vanloan is a 67 y.o. 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  [facility] [place] TTS hemodialysis RIJ permcath  Brief hospital course. Patient was admitted on 03/08/2018 from [facility] with eye pain and hypertensive emergency. Emergent hemodialysis treatment following morning for volume overload and blood pressure control. Patient's left eye was found to have vitreous hemorrhage. Hemodialysis on Tuesday, regularly scheduled. Blood pressure remained elevated. Angiogram on Wednesday, determined unsalvagable left lower extremity. Dr. Lucky Cowboy did left BKA on 11/20. Patient was having severe pain post procedure. Friday, patient was noticed having episodes of loss of consciousness. Thought at that time to be hypoglycemic events. However they continued to Saturday where she seemed to have aspirated and prolonged episode of loss of consciousness. Moved to ICU. MRI found to be negative. Hemodialysis Saturday night with hypotension requiring norepinephrine and fluid boluses. Patient then had  loss of conscious episode witnessed by the dialysis nurse and me.   1. End Stage Renal Disease with crackles on examination TTS schedule Plan on routine hemodialysis treatment tomorrow  2. 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 Lucky Cowboy.  Completed course of Cefazolin - end date was 11/22.  Echocardiogram on November 24 showed LVEF 40 to 45%, mild concentric LVH, diffuse hypokinesis, grade 3 diastolic dysfunction  3. Anemia of chronic kidney disease:  Hemoglobin 8.6 - EPO with hemodialysis treatment TTS  4. Hypertension: Now with Hypotension - required vasopressors with hemodialysis treatment this admission - Disconitnue hydralazine and isosorbide - Continue carvedilol for now.   5. Secondary Hyperparathyroidism: Sevelamer with meals  6. Syncope: concern for seizures. Loss of consciousness episodes. Discussed case with Dr. Irish Elders. MRI reviewed.  -Mental status appears to be improving slowly   LOS: 8 Tenishia Ekman 11/25/20195:29 PM

## 2018-03-16 NOTE — Progress Notes (Signed)
OT Cancellation Note  Patient Details Name: Stephanie Ellis MRN: [id number] DOB: 1950/10/03   Cancelled Treatment:    Reason Eval/Treat Not Completed: Medical issues which prohibited therapy. Per chart review, patient transferred to CCU status post rapid response, 11/23.  Per guidelines, will require new orders to resume OT services.  Will complete orders at this time.  Please re-consult OT as medically appropriate.  Jeni Salles, MPH, MS, OTR/L ascom [phone] 03/16/18, 7:59 AM

## 2018-03-16 NOTE — Progress Notes (Signed)
Subjective: Patient still lethargic but easily arousable.  Mental status improved patient is now able to follow commands.  No focal neurologic deficits noted.  No significant events reported overnight.  Remains on oxygen 2 L via nasal cannula.  Objective: Current vital signs: BP (!) 129/56   Pulse 73   Temp 98.7 F (37.1 C) (Oral)   Resp 17   Ht 5' 4"  (1.626 m)   Wt 57.6 kg   SpO2 100%   BMI 21.80 kg/m  Vital signs in last 24 hours: Temp:  [98.2 F (36.8 C)-98.7 F (37.1 C)] 98.7 F (37.1 C) (11/25 0844) Pulse Rate:  [56-73] 73 (11/25 0800) Resp:  [7-18] 17 (11/25 0800) BP: (115-151)/(40-74) 129/56 (11/25 0800) SpO2:  [94 %-100 %] 100 % (11/25 0600)  Intake/Output from previous day: 11/24 0701 - 11/25 0700 In: 65.8 [I.V.:65.8] Out: -  Intake/Output this shift: Total I/O In: 240 [P.O.:240] Out: -  Nutritional status:  Diet Order            Diet renal/carb modified with fluid restriction Diet-HS Snack? Nothing; Fluid restriction: 1200 mL Fluid; Room service appropriate? Yes; Fluid consistency: Thin  Diet effective now             Neurologic Exam:   Mental Status: Lethargic, oriented to person and place but not to time and situation.  Speech fluent without evidence of aphasia.  Able to follow simple commands without difficulty. Cranial Nerves: II: Discs flat bilaterally; Visual fields grossly normal, pupils equal, round, reactive to light and accommodation III,IV, VI: ptosis not present, extra-ocular motions intact bilaterally V,VII: smile symmetric, facial light touch sensation intact VIII: hearing normal bilaterally IX,X: gag reflex present XI: bilateral shoulder shrug XII: midline tongue extension Motor: Right :  Upper extremity   5/5    Left: Upper extremity   5/5  Right: BKA                                         Left: BKA Tone and bulk:normal tone throughout; no atrophy noted Sensory: Pinprick and light touch intact bilaterally Deep Tendon Reflexes: 2+  and symmetric throughout Plantars: Bilateral BKA Cerebellar: Unable to assess Gait: Bilateral BKA  Lab Results: Basic Metabolic Panel: Recent Labs  Lab 03/11/18 0457 03/13/18 1803 03/13/18 1815 03/14/18 1029 03/14/18 2123 03/15/18 1236  NA 142  --  136 139 135 136  K 3.8  --  4.9 4.7 4.6 4.3  CL 99  --  97* 95* 94* 97*  CO2 32  --  26 29 27 28   GLUCOSE 129*  --  143* 121* 207* 85  BUN 21  --  25* 36* 42* 22  CREATININE 3.86*  --  5.08* 6.42* 7.18* 4.85*  CALCIUM 8.6*  --  8.6* 9.0 8.5* 8.2*  MG 1.9 2.1  --   --  2.1 2.0  PHOS  --   --   --  5.3* 4.9* 3.3    Liver Function Tests: Recent Labs  Lab 03/14/18 1029  ALBUMIN 2.2*   No results for input(s): LIPASE, AMYLASE in the last 168 hours. No results for input(s): AMMONIA in the last 168 hours.  CBC: Recent Labs  Lab 03/12/18 0950 03/13/18 0313 03/14/18 1029 03/14/18 2302 03/15/18 0652  WBC 12.0* 14.1* 16.6* 16.3* 12.9*  NEUTROABS  --   --   --  13.8* 9.6*  HGB 7.7* 9.3* 8.6*  8.4* 8.6*  HCT 26.5* 32.0* 29.6* 28.3* 28.9*  MCV 103.5* 103.9* 103.5* 102.9* 102.8*  PLT 351 348 387 357 354    Cardiac Enzymes: Recent Labs  Lab 03/10/18 0931 03/10/18 1559 03/10/18 2221 03/14/18 2302  TROPONINI 0.04* 0.04* 0.04* 0.21*    Lipid Panel: No results for input(s): CHOL, TRIG, HDL, CHOLHDL, VLDL, LDLCALC in the last 168 hours.  CBG: Recent Labs  Lab 03/16/18 0017 03/16/18 0048 03/16/18 0403 03/16/18 0738 03/16/18 1148  GLUCAP 62* 125* 136* 56 126*    Microbiology: Results for orders placed or performed during the hospital encounter of 03/08/18  MRSA PCR Screening     Status: None   Collection Time: 03/10/18  8:24 AM  Result Value Ref Range Status   MRSA by PCR NEGATIVE NEGATIVE Final    Comment:        The GeneXpert MRSA Assay (FDA approved for NASAL specimens only), is one component of a comprehensive MRSA colonization surveillance program. It is not intended to diagnose MRSA infection nor to  guide or monitor treatment for MRSA infections. Performed at [facility], [address] [place], [place] [address]   CULTURE, BLOOD (ROUTINE X 2) w Reflex to ID Panel     Status: None (Preliminary result)   Collection Time: 03/14/18  6:08 PM  Result Value Ref Range Status   Specimen Description BLOOD LEFT ANTECUBITAL  Final   Special Requests   Final    BOTTLES DRAWN AEROBIC AND ANAEROBIC Blood Culture results may not be optimal due to an excessive volume of blood received in culture bottles   Culture   Final    NO GROWTH 2 DAYS Performed at [facility], [address] [place], [place] [address]    Report Status PENDING  Incomplete  CULTURE, BLOOD (ROUTINE X 2) w Reflex to ID Panel     Status: None (Preliminary result)   Collection Time: 03/14/18  7:27 PM  Result Value Ref Range Status   Specimen Description BLOOD LEFT ANTECUBITAL  Final   Special Requests BOTTLES DRAWN AEROBIC AND ANAEROBIC  Final   Culture   Final    NO GROWTH 2 DAYS Performed at [facility], [address], [place],  [address]    Report Status PENDING  Incomplete    Coagulation Studies: No results for input(s): LABPROT, INR in the last 72 hours.  Imaging: Mr Brain Wo Contrast  Result Date: 03/14/2018 CLINICAL DATA:  Intermittent delirium versus unresponsiveness. History of septic emboli to extremities, end-stage renal disease on dialysis, hypertension, and hypercholesterolemia. EXAM: MRI HEAD WITHOUT CONTRAST TECHNIQUE: Multiplanar, multiecho pulse sequences of the brain and surrounding structures were obtained without intravenous contrast. COMPARISON:  MRI head November 15, 2017 and CT HEAD March 13, 2018. FINDINGS: INTRACRANIAL CONTENTS: No reduced diffusion to suggest acute ischemia or hypercellular tumor. LEFT temporal chronic microhemorrhage. Mild parenchymal brain volume loss. Old small LEFT cerebellar infarct. Patchy supratentorial white matter FLAIR T2  hyperintensities. No hydrocephalus. No suspicious parenchymal signal, masses, mass effect. No abnormal extra-axial fluid collections. No extra-axial masses. VASCULAR: Normal major intracranial vascular flow voids present at skull base. SKULL AND UPPER CERVICAL SPINE: No abnormal sellar expansion. No suspicious calvarial bone marrow signal. Craniocervical junction maintained. SINUSES/ORBITS: LEFT mastoid effusion. Chronic severe LEFT sphenoid sinusitis. Status post bilateral ocular lens implants.The included ocular globes and orbital contents are non-suspicious. Bilateral glaucoma drainage device versus scleral banding. OTHER: None. IMPRESSION: 1. No acute intracranial process. 2. Stable mild chronic small vessel ischemic  changes and old small LEFT cerebellar infarct. 3. Mild parenchymal brain volume loss. Electronically Signed   By: Elon Alas M.D.   On: 03/14/2018 17:53   Dg Chest Port 1 View  Result Date: 03/15/2018 CLINICAL DATA:  Atelectasis, history coronary artery disease post CABG and MI, end-stage renal disease, hypertension EXAM: PORTABLE CHEST 1 VIEW COMPARISON:  Portable exam 0424 hours compared to 03/14/2018 FINDINGS: LEFT jugular dual-lumen central venous catheter with tip projecting over RIGHT atrium. Enlargement of cardiac silhouette post CABG. Sub pulmonary vascular congestion. Perihilar infiltrates favor pulmonary edema. Atherosclerotic calcification aorta. Tiny RIGHT pleural effusion. No pneumothorax. IMPRESSION: Mild pulmonary edema with tiny RIGHT pleural effusion. Electronically Signed   By: Lavonia Dana M.D.   On: 03/15/2018 07:10    Medications:  I have reviewed the patient's current medications. Prior to Admission:  Medications Prior to Admission  Medication Sig Dispense Refill Last Dose  . apixaban (ELIQUIS) 2.5 MG TABS tablet Take 1 tablet (2.5 mg total) by mouth 2 (two) times daily. 60 tablet 0 03/08/2018 at 0800  . aspirin EC 81 MG tablet Take 1 tablet (81 mg total) by  mouth daily.   03/08/2018 at 0800  . atorvastatin (LIPITOR) 80 MG tablet Take 80 mg by mouth at bedtime.    03/07/2018 at 2000  . calcitRIOL (ROCALTROL) 0.5 MCG capsule Take 0.5 mcg by mouth daily.   03/08/2018 at 0800  . carvedilol (COREG) 3.125 MG tablet Take 3.125 mg by mouth 2 (two) times daily.    03/08/2018 at 0800  . ceFAZolin (ANCEF) IVPB Indication:  Osteomyelitis/Tricuspic Endocarditis Last Day of Therapy:  03/13/18 Labs - Once weekly:  CBC/D and CMP, 12 Units 0 03/07/2018 at 0800  . docusate sodium (COLACE) 100 MG capsule Take 1 capsule (100 mg total) by mouth 2 (two) times daily. 10 capsule 0 03/08/2018 at 0800  . folic acid (FOLVITE) 1 MG tablet Take 1 tablet (1 mg total) by mouth daily. 30 tablet 3 03/08/2018 at 0800  . insulin glargine (LANTUS) 100 unit/mL SOPN Inject 5 Units into the skin at bedtime.   03/07/2018 at 2000  . isosorbide mononitrate (IMDUR) 30 MG 24 hr tablet Take 30 mg by mouth daily.    03/08/2018 at 0800  . midodrine (PROAMATINE) 10 MG tablet Take 1 tablet (10 mg total) by mouth 3 (three) times a week. (Patient taking differently: Take 10 mg by mouth 3 (three) times a week. Take Tuesday, Thursday, and Saturday) 30 tablet 0 03/07/2018 at 0800  . mirtazapine (REMERON) 7.5 MG tablet Take 7.5 mg by mouth at bedtime.   03/07/2018 at 2000  . Multiple Vitamin (MULTIVITAMIN WITH MINERALS) TABS tablet Take 2 tablets by mouth daily. Gummy vitamins   03/08/2018 at 0800  . Multiple Vitamins-Minerals (PRORENAL + D) TABS Take 1 tablet by mouth daily.   03/08/2018 at 0800  . nitroGLYCERIN (NITROSTAT) 0.4 MG SL tablet Place 1 tablet (0.4 mg total) under the tongue every 5 (five) minutes as needed for chest pain. 30 tablet 0 prn at prn  . nizatidine (AXID) 150 MG capsule Take 1 capsule (150 mg total) by mouth 2 (two) times daily. 30 capsule 0 03/08/2018 at 0800  . Nutritional Supplements (FEEDING SUPPLEMENT, NEPRO CARB STEADY,) LIQD Take 237 mLs by mouth 2 (two) times daily between  meals. 237 mL 0 03/08/2018 at 0800  . Omega-3 300 MG CAPS Take 2 capsules by mouth daily.   03/08/2018 at 0800  . ondansetron (ZOFRAN) 4 MG tablet Take 4 mg  by mouth every 8 (eight) hours as needed for nausea or vomiting.   prn at prn  . oxyCODONE (OXY IR/ROXICODONE) 5 MG immediate release tablet Take 1 tablet (5 mg total) by mouth every 6 (six) hours as needed for moderate pain or severe pain. 30 tablet 0 prn at prn  . Polyethyl Glycol-Propyl Glycol 0.4-0.3 % SOLN Place 1 drop into both eyes 2 (two) times daily.   03/08/2018 at 0800  . sevelamer carbonate (RENVELA) 800 MG tablet Take 800 mg by mouth 3 (three) times daily with meals.   03/08/2018 at 0800  . vitamin B-12 (CYANOCOBALAMIN) 1000 MCG tablet Take 1 tablet (1,000 mcg total) by mouth daily. (Patient taking differently: Take 500 mcg by mouth daily. ) 30 tablet 0 03/08/2018 at 0800   Scheduled: . apixaban  2.5 mg Oral BID  . aspirin EC  81 mg Oral Daily  . atorvastatin  80 mg Oral QHS  . brimonidine  1 drop Both Eyes TID  . carvedilol  6.25 mg Oral BID  . ceFEPime (MAXIPIME) IV  500 mg Intravenous Q24H  . Chlorhexidine Gluconate Cloth  6 each Topical Q0600  . docusate sodium  100 mg Oral BID  . epoetin (EPOGEN/PROCRIT) injection  10,000 Units Intravenous Q T,Th,Sa-HD  . erythromycin   Left Eye Q6H  . famotidine  20 mg Oral Daily  . feeding supplement (NEPRO CARB STEADY)  237 mL Oral BID BM  . feeding supplement (PRO-STAT SUGAR FREE 64)  30 mL Oral TID  . folic acid  1 mg Oral Daily  . gabapentin  100 mg Oral QHS  . levETIRAcetam  500 mg Oral Daily  . [START ON 03/17/2018] levETIRAcetam  500 mg Oral Once per day on Tue Thu Sat  . mirtazapine  7.5 mg Oral QHS  . multivitamin  1 tablet Oral Daily  . omega-3 acid ethyl esters  1 capsule Oral Daily  . polyethylene glycol  17 g Oral Daily  . polyvinyl alcohol  1 drop Both Eyes BID  . sevelamer carbonate  800 mg Oral TID WC  . thiamine  100 mg Oral Daily  . timolol  1 drop Both Eyes  TID  . vitamin C  500 mg Oral BID    Assessment: 67 y.o. 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  During the course of admission, patient noted to have altered mental status with confusion and brief episodes of unresponsiveness described as blank staring.  Concerns for seizure activity.  Patient started on Keppra 500 mg twice daily.  Mental status improved with no focal neurologic deficit noted.  Patient is now responsive and able to follow commands, however she is still lethargic. Tolerating nasal cannula 2 L/min with no acute distress noted.  Recommendations: 1.  EEG pending 2. Seizure precautions 3. Ativan prn seizure activity 4. Change Keppra to 569m po BID.    This patient was staffed with Dr. ZIrish Elders YAlease Framewho personally evaluated patient, reviewed documentation and agreed with assessment and plan of care as above.  ERufina Falco DNP, FNP-BC Board certified Nurse Practitioner Neurology Department   LOS: 8 days   03/16/2018  1:07 PM

## 2018-03-16 NOTE — Progress Notes (Signed)
Bedside EEG completed, results pending. 

## 2018-03-16 NOTE — Progress Notes (Signed)
Pharmacy Antibiotic Note  Stephanie Ellis is a 67 y.o. female admitted on 03/08/2018 with Endocarditis. Patient has ESRD and is on HD TTS. She has just completed a 6-week course of IV Ancef for MSSA tricuspid endocarditis. Pharmacy has been consulted for cefepime dosing for possible newly acquired PNA. Pharmacy has been consulted for cefepime dosing.   Plan: Per discussions with CCM this morning, continue cefepime 1000 mg IV q24h.  Height: 5' 4"  (162.6 cm) Weight: 126 lb 15.8 oz (57.6 kg) IBW/kg (Calculated) : 54.7  Temp (24hrs), Avg:98.4 F (36.9 C), Min:98.2 F (36.8 C), Max:98.7 F (37.1 C)  Recent Labs  Lab 03/11/18 0457 03/12/18 0950 03/13/18 0313 03/13/18 1815 03/14/18 1029 03/14/18 1808 03/14/18 2123 03/14/18 2302 03/15/18 0652 03/15/18 1236  WBC 10.4 12.0* 14.1*  --  16.6*  --   --  16.3* 12.9*  --   CREATININE 3.86*  --   --  5.08* 6.42*  --  7.18*  --   --  4.85*  LATICACIDVEN  --   --   --   --   --  2.3* 0.9 0.8  --   --     Estimated Creatinine Clearance: 9.7 mL/min (A) (by C-G formula based on SCr of 4.85 mg/dL (H)).    Antimicrobials this admission: Cefepime 11/24 >> Cefazolin 11/17 >> 11/23  Dose adjustments this admission: Patient is on HD.   Microbiology results: 11/23 BCx pending 11/19 MRSA PCR (-)  10/30 BCx NGTD   Thank you for allowing pharmacy to be a part of this patient's care.  Ocie Doyne, PharmD candidate  03/16/2018 11:52 AM

## 2018-03-16 NOTE — Progress Notes (Signed)
[facility] NAME: Stephanie Ellis    MR#:  [id number]  DATE OF BIRTH:  11/11/1950  SUBJECTIVE:  Alert, awake, oriented today.  No further delirium blank stares.  Added on Keppra by neurology. REVIEW OF SYSTEMS:    Review of Systems  Alert, awake, oriented Cardiovascular; no chest pain, palpitations Pulmonary: No shortness of breath or wheezing Neurologically: Alert, awake, oriented. GI: No nausea, vomiting, no abdominal pain.   DRUG ALLERGIES:   Allergies  Allergen Reactions  . Chlorthalidone Anaphylaxis, Itching and Rash  . Fentanyl Rash  . Midazolam Rash  . Ace Inhibitors Other (See Comments)    Reaction:  Hyperkalemia, agitation   . Angiotensin Receptor Blockers Other (See Comments)    Reaction:  Hyperkalemia, agitation   . Norvasc [Amlodipine] Itching and Rash  . Phenergan [Promethazine Hcl] Anxiety    "antsy, can't sit still"    VITALS:  Blood pressure (!) 129/56, pulse 73, temperature 98.7 F (37.1 C), temperature source Oral, resp. rate 17, height 5' 4"  (1.626 m), weight 57.6 kg, SpO2 100 %.  PHYSICAL EXAMINATION:   Constitutional: Appears well-developed and well-nourished. No distress. HENT: Normocephalic. Marland Kitchen Oropharynx is clear and moist.  Eyes: Conjunctivae and EOM are normal. PERRLA, no scleral icterus.  Neck: Normal ROM. Neck supple. No JVD. No tracheal deviation. CVS: RRR, S1/S2 +, no murmurs, no gallops, no carotid bruit.  Pulmonary: Effort and breath sounds normal, no stridor, rhonchi, wheezes, rales.  Abdominal: Soft. BS +,  no distension, tenderness, rebound or guarding.  Musculoskeletal: Status post right BKA, left BKA recent with dressing. Neuro: Alert, no focal neurological deficit, bilateral BKA. Skin: Skin is warm and dry. No rash noted.  On her back she has dark pigmentation in the central half of the back ,but it does not look inflamed or infective, appears kind of chronic dark  discoloration. Psychiatric: Normal mood and affect.    LABORATORY PANEL:   CBC Recent Labs  Lab 03/15/18 0652  WBC 12.9*  HGB 8.6*  HCT 28.9*  PLT 354   ------------------------------------------------------------------------------------------------------------------  Chemistries  Recent Labs  Lab 03/15/18 1236  NA 136  K 4.3  CL 97*  CO2 28  GLUCOSE 85  BUN 22  CREATININE 4.85*  CALCIUM 8.2*  MG 2.0   ------------------------------------------------------------------------------------------------------------------  Cardiac Enzymes Recent Labs  Lab 03/10/18 1559 03/10/18 2221 03/14/18 2302  TROPONINI 0.04* 0.04* 0.21*   ------------------------------------------------------------------------------------------------------------------  RADIOLOGY:  Mr Brain Wo Contrast  Result Date: 03/14/2018 CLINICAL DATA:  Intermittent delirium versus unresponsiveness. History of septic emboli to extremities, end-stage renal disease on dialysis, hypertension, and hypercholesterolemia. EXAM: MRI HEAD WITHOUT CONTRAST TECHNIQUE: Multiplanar, multiecho pulse sequences of the brain and surrounding structures were obtained without intravenous contrast. COMPARISON:  MRI head November 15, 2017 and CT HEAD March 13, 2018. FINDINGS: INTRACRANIAL CONTENTS: No reduced diffusion to suggest acute ischemia or hypercellular tumor. LEFT temporal chronic microhemorrhage. Mild parenchymal brain volume loss. Old small LEFT cerebellar infarct. Patchy supratentorial white matter FLAIR T2 hyperintensities. No hydrocephalus. No suspicious parenchymal signal, masses, mass effect. No abnormal extra-axial fluid collections. No extra-axial masses. VASCULAR: Normal major intracranial vascular flow voids present at skull base. SKULL AND UPPER CERVICAL SPINE: No abnormal sellar expansion. No suspicious calvarial bone marrow signal. Craniocervical junction maintained. SINUSES/ORBITS: LEFT mastoid effusion. Chronic  severe LEFT sphenoid sinusitis. Status post bilateral ocular lens implants.The included ocular globes and orbital contents are non-suspicious. Bilateral glaucoma drainage device versus scleral banding. OTHER: None. IMPRESSION: 1.  No acute intracranial process. 2. Stable mild chronic small vessel ischemic changes and old small LEFT cerebellar infarct. 3. Mild parenchymal brain volume loss. Electronically Signed   By: Elon Alas M.D.   On: 03/14/2018 17:53   Dg Chest Port 1 View  Result Date: 03/15/2018 CLINICAL DATA:  Atelectasis, history coronary artery disease post CABG and MI, end-stage renal disease, hypertension EXAM: PORTABLE CHEST 1 VIEW COMPARISON:  Portable exam 0424 hours compared to 03/14/2018 FINDINGS: LEFT jugular dual-lumen central venous catheter with tip projecting over RIGHT atrium. Enlargement of cardiac silhouette post CABG. Sub pulmonary vascular congestion. Perihilar infiltrates favor pulmonary edema. Atherosclerotic calcification aorta. Tiny RIGHT pleural effusion. No pneumothorax. IMPRESSION: Mild pulmonary edema with tiny RIGHT pleural effusion. Electronically Signed   By: Lavonia Dana M.D.   On: 03/15/2018 07:10     ASSESSMENT AND PLAN:   67 y.o. 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  1.    Peripheral arterial disease in bilateral lower extremities due to Diabetes  Left foot second and third toes with gangrene Nonsalvageable left lower extremity per podiatry Vascular surgery had done left BKA 03/11/18 -Plan to change dressing in OR..  2.    Intermittent episodes of  unresponsiveness Rule out seizures EEG pending Seen by neurology. Dr. Irish Elders Keppra trial 100 before hemodialysis and 500 post hemodialysis CT scan headis negative Carotid Dopplers with no significant stenosis MRI of the brain with contrast could not be ordered as patient is a dialysis patient No further blank stares, patient alert, awake, oriented today.  3.  Diabetes mellitus with complications of PAD and CKD: Continue ADA diet with Lantus and sliding scale As now had hypoglycemia, stopped her Lantus and kept on glucose check without any coverage.   4.  Left eye blurred vision-reporting purulent discharge today  appreciate ophthalmology input in the ED.  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  She had prior laser capsulotomy. Eyedrops recommended at this time.  She will need pars plano vitrectomy-recommended outpatient follow-up after discharge. Add erythromycin ointment to the eye.  5. Acute hypoxic respiratory failure due to pulmonary edema from end-stage renal disease needing dialysis:  Clinically better. Status post hemodialysis. oxygen to be weaned off.  6.  End-stage renal disease with fluid overload Uric acid level is in the normal range on hemodialysis.,  Due to episodes of hypotension did not get dialysis on Saturday, I spoke with Dr. Murlean Iba today patient usually gets dialysis Tuesday, Thursday, Saturday.  Patient will get dialysis tomorrow.   7.    History of tricuspid endocarditis-with MSSA.  Currently on cefazolin with dialysis.      8.  Hypertensive urgency resolved: Resolved, now hypotensive requiring pressors.  Today morning she is off the pressors, BP  stable Likely will be transferred out of ICU to regular floor today 9/ hypoglycemia Discontinued Lantus and on glucose check to avoid hypoglycemia.  D/w dr Abigail Butts, neurology Dr. Irish Elders and intensivist   Management plans discussed with the patient and she is in  agreement.  CODE STATUS: DNR  TOTAL TIME TAKING CARE OF THIS PATIENT: 35 minutes.  POSSIBLE D/C 2-3 days, DEPENDING ON CLINICAL CONDITION.  More than 50% time spent in counseling, coordination of care. Epifanio Lesches M.D on 03/16/2018 at 9:46 AM  Between 7am to 6pm - Pager - [phone] After 6pm go to [email or web address] - password [email or web address]  Office  [phone]  CC: Primary care physician; No primary care provider on file.  Note: This dictation was prepared with Dragon dictation along with smaller phrase technology. Any transcriptional errors that result from this process are unintentional.

## 2018-03-16 NOTE — Progress Notes (Signed)
[facility]  Daily Progress Note   Subjective: 5 Days Post-Op: Left below-the-knee amputation  Improved pain at the left stump site. Undergoing workup for possible seizure activity.  Objective: Vitals:   03/16/18 0600 03/16/18 0700 03/16/18 0800 03/16/18 0844  BP: (!) 131/42 (!) 134/44 (!) 129/56   Pulse: 64 65 73   Resp: (!) 7 12 17    Temp:    98.7 F (37.1 C)  TempSrc:    Oral  SpO2: 100%     Weight:      Height:        Intake/Output Summary (Last 24 hours) at 03/16/2018 1517 Last data filed at 03/16/2018 1350 Gross per 24 hour  Intake 305.82 ml  Output -  Net 305.82 ml   Physical Exam: A&Ox3, NAD CV: RRR Pulmonary: CTA Bilaterally Abdomen: Soft, Nontender, Nondistended Vascular:  Right lower extremity: Thigh soft. Able to bend at the knee. Stump is healthy. Incision line is healthy and inctact.  Left lower extremity: Thigh soft. Able to bend at the knee. OR dressing removed. Medial aspect of incision with some ecchymosis. Staples intact clean and dry. Stump healing well.    Laboratory: CBC    Component Value Date/Time   WBC 12.9 (H) 03/15/2018 0652   HGB 8.6 (L) 03/15/2018 0652   HCT 28.9 (L) 03/15/2018 0652   PLT 354 03/15/2018 0652   BMET    Component Value Date/Time   NA 136 03/15/2018 1236   K 4.3 03/15/2018 1236   CL 97 (L) 03/15/2018 1236   CO2 28 03/15/2018 1236   GLUCOSE 85 03/15/2018 1236   BUN 22 03/15/2018 1236   CREATININE 4.85 (H) 03/15/2018 1236   CALCIUM 8.2 (L) 03/15/2018 1236   GFRNONAA 8 (L) 03/15/2018 1236   GFRAA 10 (L) 03/15/2018 1236   Assessment/Planning: The patient is a 67 year old female with severe PAD s/p right BKA now left BKA - stable 1) OK from vascular standpoint to be discharged back to nursing facility when deemed medically appropriate. 2) OR dressing removed. Would change dressing daily.  3) Patient to follow up in three weeks for staple removal.   Discussed with Dr. Ellis Parents  Damont Balles PA-C 03/16/2018 3:17 PM

## 2018-03-16 NOTE — Progress Notes (Signed)
Report given to Lenna Sciara, RN on [facility]. Patient transferred on tele to room 210. Wilnette Kales

## 2018-03-16 NOTE — Discharge Instructions (Signed)
Please change left BKA dressing daily. Cover with kerlix. It is ok to leave open to air when not working with physical therapy or being active.  Patient may shower. Do not bath or submerge in water. Please encourage flexibility at the bilateral knee joints to avoid contracture.

## 2018-03-16 NOTE — Procedures (Signed)
History: 67 year old female being evaluated for encephalopathy  Sedation: None  Technique: This is a 21 channel routine scalp EEG performed at the bedside with bipolar and monopolar montages arranged in accordance to the international 10/20 system of electrode placement. One channel was dedicated to EKG recording.    Background: The background consists predominantly of generalized irregular delta and theta activities, though there is a poorly formed, poorly sustained posterior dominant rhythm of 7 - 8 Hz which is seen at times.  There are also bursts of frontally predominant intermittent rhythmic delta activity(FIRDA).  There is no evolution to this activity that could suggest an ictal nature.  Photic stimulation: Physiologic driving is not performed  EEG Abnormalities: 1) FIRDA 2) generalized irregular slow activity  Clinical Interpretation: This EEG is consistent with a mild to moderate generalized nonspecific cerebral dysfunction (encephalopathy).   There was no seizure or seizure predisposition recorded on this study. Please note that a normal EEG does not preclude the possibility of epilepsy.   Roland Rack, MD [facility] [phone]  If 7pm- 7am, please page neurology on call as listed in [place].

## 2018-03-17 ENCOUNTER — Inpatient Hospital Stay: Payer: Medicare Other

## 2018-03-17 LAB — GLUCOSE, CAPILLARY
Glucose-Capillary: 103 mg/dL — ABNORMAL HIGH (ref 70–99)
Glucose-Capillary: 120 mg/dL — ABNORMAL HIGH (ref 70–99)
Glucose-Capillary: 144 mg/dL — ABNORMAL HIGH (ref 70–99)
Glucose-Capillary: 156 mg/dL — ABNORMAL HIGH (ref 70–99)
Glucose-Capillary: 158 mg/dL — ABNORMAL HIGH (ref 70–99)
Glucose-Capillary: 211 mg/dL — ABNORMAL HIGH (ref 70–99)

## 2018-03-17 LAB — CBC WITH DIFFERENTIAL/PLATELET
Abs Immature Granulocytes: 0.1 10*3/uL — ABNORMAL HIGH (ref 0.00–0.07)
Basophils Absolute: 0 10*3/uL (ref 0.0–0.1)
Basophils Relative: 0 %
Eosinophils Absolute: 0.7 10*3/uL — ABNORMAL HIGH (ref 0.0–0.5)
Eosinophils Relative: 6 %
HCT: 30.7 % — ABNORMAL LOW (ref 36.0–46.0)
Hemoglobin: 8.9 g/dL — ABNORMAL LOW (ref 12.0–15.0)
Immature Granulocytes: 1 %
Lymphocytes Relative: 11 %
Lymphs Abs: 1.3 10*3/uL (ref 0.7–4.0)
MCH: 30.1 pg (ref 26.0–34.0)
MCHC: 29 g/dL — ABNORMAL LOW (ref 30.0–36.0)
MCV: 103.7 fL — ABNORMAL HIGH (ref 80.0–100.0)
Monocytes Absolute: 0.9 10*3/uL (ref 0.1–1.0)
Monocytes Relative: 8 %
Neutro Abs: 8.5 10*3/uL — ABNORMAL HIGH (ref 1.7–7.7)
Neutrophils Relative %: 74 %
Platelets: 390 10*3/uL (ref 150–400)
RBC: 2.96 MIL/uL — ABNORMAL LOW (ref 3.87–5.11)
RDW: 20.2 % — ABNORMAL HIGH (ref 11.5–15.5)
WBC: 11.5 10*3/uL — ABNORMAL HIGH (ref 4.0–10.5)
nRBC: 0.2 % (ref 0.0–0.2)

## 2018-03-17 LAB — CALCIUM, IONIZED: Calcium, Ionized, Serum: 4.9 mg/dL (ref 4.5–5.6)

## 2018-03-17 MED ORDER — OMEGA-3-ACID ETHYL ESTERS 1 G PO CAPS
1.0000 | ORAL_CAPSULE | Freq: Every day | ORAL | Status: DC
  Administered 2018-03-18: 1 g via ORAL
  Filled 2018-03-17: qty 1

## 2018-03-17 MED ORDER — VITAMIN B-1 100 MG PO TABS
100.0000 mg | ORAL_TABLET | Freq: Every day | ORAL | Status: DC
  Filled 2018-03-17: qty 1

## 2018-03-17 MED ORDER — HALOPERIDOL LACTATE 5 MG/ML IJ SOLN
1.0000 mg | Freq: Once | INTRAMUSCULAR | Status: AC
  Administered 2018-03-17: 1 mg via INTRAMUSCULAR
  Filled 2018-03-17: qty 0.2

## 2018-03-17 MED ORDER — RENA-VITE PO TABS
1.0000 | ORAL_TABLET | Freq: Every day | ORAL | Status: DC
  Filled 2018-03-17: qty 1

## 2018-03-17 MED ORDER — FOLIC ACID 1 MG PO TABS
1.0000 mg | ORAL_TABLET | Freq: Every day | ORAL | Status: DC
  Filled 2018-03-17: qty 1

## 2018-03-17 MED ORDER — DIPHENHYDRAMINE HCL 50 MG/ML IJ SOLN
12.5000 mg | Freq: Once | INTRAMUSCULAR | Status: AC
  Administered 2018-03-17: 12.5 mg via INTRAMUSCULAR
  Filled 2018-03-17: qty 1

## 2018-03-17 NOTE — Progress Notes (Signed)
Verbal order from MD to discontinue order for neurochecks Q4, change CBG to BID, and restart pt on carb modified/ renal diet.  Eljay Lave [employer]

## 2018-03-17 NOTE — Progress Notes (Signed)
Chaplain responded to a RR. No family present. Pt was in a deep sleep and had slowed breathing. Pt begin to com around and recognized her surroundings. Chaplain maintain space and offer silent prayers. Chaplain then rubbed Pt brow to calm Pt down.    03/17/18 0000  Clinical Encounter Type  Visited With Patient  Visit Type Code  Referral From Nurse  Spiritual Encounters  Spiritual Needs Prayer;Emotional

## 2018-03-17 NOTE — Progress Notes (Signed)
HD tx started w/o complication, pt stable    03/17/18 1149  Vital Signs  Pulse Rate 61  Pulse Rate Source Monitor  Resp 12  BP (!) 180/56  BP Location Right Arm  BP Method Automatic  Patient Position (if appropriate) Lying  Oxygen Therapy  SpO2 100 %  O2 Device Nasal Cannula  O2 Flow Rate (L/min) 2 L/min  During Hemodialysis Assessment  Blood Flow Rate (mL/min) 300 mL/min  Arterial Pressure (mmHg) -130 mmHg  Venous Pressure (mmHg) 110 mmHg  Transmembrane Pressure (mmHg) 70 mmHg  Ultrafiltration Rate (mL/min) 670 mL/min  Dialysate Flow Rate (mL/min) 600 ml/min  Conductivity: Machine  13.8  HD Safety Checks Performed Yes  Dialysis Fluid Bolus Normal Saline  Bolus Amount (mL) 250 mL  Intra-Hemodialysis Comments Tx initiated

## 2018-03-17 NOTE — Progress Notes (Signed)
Pre HD Assessment    03/17/18 1145  Neurological  Level of Consciousness Alert  Orientation Level Oriented to person  Respiratory  Respiratory Pattern Regular  Chest Assessment Chest expansion symmetrical  Bilateral Breath Sounds Clear;Diminished  Cough None  Cardiac  Pulse Regular  Heart Sounds S1, S2  ECG Monitor Yes  Vascular  R Radial Pulse +2  L Radial Pulse +2  Psychosocial  Psychosocial (WDL) WDL  Patient Behaviors Cooperative;Calm;Appropriate for situation  Needs Expressed Denies

## 2018-03-17 NOTE — Progress Notes (Addendum)
RN went into pt.'s room to pass her evening medications at 1800. RN found pt very confused and agitated and stated that "she is very confused, she feels like the hospital is out to get her, everyone is lying to her, and that she is not crazy" pt is alerted to self only at this time. VSS CBG 144. RN notified MD, verbal orders to place CT WO contrast and ABGs stat and hold PO Keppra at this time. Pt has lost IV access due to her agitation. When orderly arrived, pt became even more agitated and swapping at nursing staff. Pt is refusing to go to CT scan and refusing ABGs and refusing oral PO medication. Prime doctor notified that RN has lost IV access and pt is very aggressive and agitated towards all nursing staff. New orders placed by Dr. Marcille Blanco for IM medications to be given. Dr. Marcille Blanco arrived at pt.'s bedside to assess pt. Verbal orders to leave CT WO contrast and ABGs STAT orders in until pt calms down. CT was made aware that pt is to agitated and aggressive to go down for CT and has lost IV access at this time. IV team consult was placed. IM injections were given with MD at bedside. Verbal order to move IV Maxpime to 2000 or until IV is placed. POA was called and notified and stated "give my mother what she needs to calm down". Oncoming RN made aware of event. Pt has responded to IM injections and is resting at this time   [employer]

## 2018-03-17 NOTE — Progress Notes (Signed)
Nutrition Follow Up Note   DOCUMENTATION CODES:   Not applicable  INTERVENTION:   Nepro Shake po BID, each supplement provides 425 kcal and 19 grams protein  Magic cup TID with meals, each supplement provides 290 kcal and 9 grams of protein  Rena-vite daily  Vitamin C 528m po BID  Liberalize diet   Bowel regimen per MD  NUTRITION DIAGNOSIS:   Increased nutrient needs related to chronic illness(ESRD on HD, wound healing ) as evidenced by increased estimated needs.  GOAL:   Patient will meet greater than or equal to 90% of their needs  -progressing   MONITOR:   PO intake, Supplement acceptance, Labs, Weight trends, Skin, I & O's  ASSESSMENT:    67y.o. 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   Pt s/p L BKA 11/20  Pt with improved appetite and oral intake; ate 75% of breakfast this morning which included scrambled eggs, toast and peaches. Pt is refusing most of her supplements. RD will liberalize the renal restrictions from pt's diet as her electrolytes are wnl and the renal diet is very restrictive. Per chart, pt is weight stable since admit. Recommend continue vitamins and supplements; can provide Ensure if pt prefers this over Nepro. No BM noted since 11/21; recommend bowel regimen per MD.   Medications reviewed and include: aspirin, colace, epogen, pepcid, folic acid, remeron, rena-vite, omega 3, miralax, renvela, thiamine, vitamin C, cefepime   Labs reviewed: K 4.3 wnl, P 3.3 wnl, Mg 2.0 wnl Wbc 11.5(H), Hgb 8.9(L), Hct 30.7(L), MCV 103.7(H), MCHC 29.0(L)  Diet Order:   Diet Order          Diet Carb Modified Fluid consistency: Thin; Room service appropriate? Yes  Diet effective now             EDUCATION NEEDS:   No education needs have been identified at this time  Skin:  Skin Assessment: Reviewed RN Assessment(incision R and L legs s/p BKAs)  Last BM:  11/21  Height:   Ht Readings from Last 1 Encounters:  03/14/18 5' 4"  (1.626 m)    Weight:   Wt Readings from Last 1 Encounters:  03/15/18 57.6 kg    Ideal Body Weight:  48.1 kg(adjusted for bilateral BKAs)  BMI:  Body mass index is 21.8 kg/m.  Estimated Nutritional Needs:   Kcal:  1400-1600kcal/day   Protein:  77-88g/day   Fluid:  UOP + 1L  CKoleen DistanceMS, RD, LDN Pager #- [phone]#- [phone] Hours Pager: [phone]

## 2018-03-17 NOTE — Progress Notes (Signed)
[facility]  ROUNDING NOTE   Subjective:   Patient was less responsive for some time last night. Was able to be awoken eventually Appears to be her normal self this morning Ate full breakfast  Objective:  Vital signs in last 24 hours:  Temp:  [97.6 F (36.4 C)-98.3 F (36.8 C)] 98.3 F (36.8 C) (11/26 0447) Pulse Rate:  [56-72] 59 (11/26 0447) Resp:  [8-20] 20 (11/26 0447) BP: (118-188)/(33-76) 152/51 (11/26 0447) SpO2:  [99 %-100 %] 100 % (11/26 0447)  Weight change:  Filed Weights   03/14/18 1651 03/14/18 2029 03/15/18 0015  Weight: 56 kg 56.8 kg 57.6 kg    Intake/Output: I/O last 3 completed shifts: In: 940 [P.O.:840; IV Piggyback:100] Out: -    Intake/Output this shift:  No intake/output data recorded.  Physical Exam: General: NAD, laying in bed  Head: Normocephalic, atraumatic. Moist oral mucosal membranes  Eyes: Anicteric,   Neck: Supple,  Lungs:   Normal breathing effort, decreased breath sounds at bases  Heart: Regular rate and rhythm  Abdomen:  Soft, nontender,   Extremities:  bilateral BKA - left dressings are clean  Neurologic: alert, able to answer questions correctly after some orientation  Skin: No lesions  Access: Left IJ permcath    Basic Metabolic Panel: Recent Labs  Lab 03/11/18 0457 03/13/18 1803 03/13/18 1815 03/14/18 1029 03/14/18 2123 03/15/18 1236  NA 142  --  136 139 135 136  K 3.8  --  4.9 4.7 4.6 4.3  CL 99  --  97* 95* 94* 97*  CO2 32  --  26 29 27 28   GLUCOSE 129*  --  143* 121* 207* 85  BUN 21  --  25* 36* 42* 22  CREATININE 3.86*  --  5.08* 6.42* 7.18* 4.85*  CALCIUM 8.6*  --  8.6* 9.0 8.5* 8.2*  MG 1.9 2.1  --   --  2.1 2.0  PHOS  --   --   --  5.3* 4.9* 3.3    Liver Function Tests: Recent Labs  Lab 03/14/18 1029  ALBUMIN 2.2*   No results for input(s): LIPASE, AMYLASE in the last 168 hours. No results for input(s): AMMONIA in the last 168 hours.  CBC: Recent Labs  Lab 03/13/18 0313  03/14/18 1029 03/14/18 2302 03/15/18 0652 03/17/18 0553  WBC 14.1* 16.6* 16.3* 12.9* 11.5*  NEUTROABS  --   --  13.8* 9.6* 8.5*  HGB 9.3* 8.6* 8.4* 8.6* 8.9*  HCT 32.0* 29.6* 28.3* 28.9* 30.7*  MCV 103.9* 103.5* 102.9* 102.8* 103.7*  PLT 348 387 357 354 390    Cardiac Enzymes: Recent Labs  Lab 03/10/18 0931 03/10/18 1559 03/10/18 2221 03/14/18 2302  TROPONINI 0.04* 0.04* 0.04* 0.21*    BNP: Invalid input(s): POCBNP  CBG: Recent Labs  Lab 03/16/18 1929 03/16/18 2356 03/17/18 0014 03/17/18 0423 03/17/18 0747  GLUCAP 218* 183* 48* 156* 120*    Microbiology: Results for orders placed or performed during the hospital encounter of 03/08/18  MRSA PCR Screening     Status: None   Collection Time: 03/10/18  8:24 AM  Result Value Ref Range Status   MRSA by PCR NEGATIVE NEGATIVE Final    Comment:        The GeneXpert MRSA Assay (FDA approved for NASAL specimens only), is one component of a comprehensive MRSA colonization surveillance program. It is not intended to diagnose MRSA infection nor to guide or monitor treatment for MRSA infections. Performed at [facility], [address] [place], [place] [address]   CULTURE, BLOOD (ROUTINE X 2) w Reflex to ID Panel     Status: None (Preliminary result)   Collection Time: 03/14/18  6:08 PM  Result Value Ref Range Status   Specimen Description BLOOD LEFT ANTECUBITAL  Final   Special Requests   Final    BOTTLES DRAWN AEROBIC AND ANAEROBIC Blood Culture results may not be optimal due to an excessive volume of blood received in culture bottles   Culture   Final    NO GROWTH 3 DAYS Performed at [facility], [address] [place], [place] [address]    Report Status PENDING  Incomplete  CULTURE, BLOOD (ROUTINE X 2) w Reflex to ID Panel     Status: None (Preliminary result)   Collection Time: 03/14/18  7:27 PM  Result Value Ref Range Status   Specimen Description BLOOD LEFT ANTECUBITAL  Final    Special Requests BOTTLES DRAWN AEROBIC AND ANAEROBIC  Final   Culture   Final    NO GROWTH 3 DAYS Performed at [facility], [address], [place], [place] [address]    Report Status PENDING  Incomplete    Coagulation Studies: No results for input(s): LABPROT, INR in the last 72 hours.  Urinalysis: No results for input(s): COLORURINE, LABSPEC, PHURINE, GLUCOSEU, HGBUR, BILIRUBINUR, KETONESUR, PROTEINUR, UROBILINOGEN, NITRITE, LEUKOCYTESUR in the last 72 hours.  Invalid input(s): APPERANCEUR    Imaging: Dg Chest Port 1 View  Result Date: 03/17/2018 CLINICAL DATA:  Atelectasis EXAM: PORTABLE CHEST 1 VIEW COMPARISON:  02/12/2018 FINDINGS: Cardiac shadow remains enlarged. Dialysis catheter is again noted on the left and stable. Postsurgical changes are again seen. Minimal blunting of the costophrenic angles is noted bilaterally stable from the previous day. No pneumothorax is noted. No focal confluent infiltrate is seen. IMPRESSION: Small bilateral pleural effusions. Electronically Signed   By: Inez Catalina M.D.   On: 03/17/2018 07:41     Medications:   . ceFEPime (MAXIPIME) IV Stopped (03/16/18 1915)  . small volume/piggyback builder Stopped (03/16/18 1524)   . apixaban  2.5 mg Oral BID  . aspirin EC  81 mg Oral Daily  . atorvastatin  80 mg Oral QHS  . brimonidine  1 drop Both Eyes TID  . carvedilol  6.25 mg Oral BID  . Chlorhexidine Gluconate Cloth  6 each Topical Q0600  . docusate sodium  100 mg Oral BID  . epoetin (EPOGEN/PROCRIT) injection  10,000 Units Intravenous Q T,Th,Sa-HD  . erythromycin   Left Eye Q6H  . famotidine  20 mg Oral Daily  . feeding supplement (NEPRO CARB STEADY)  237 mL Oral BID BM  . feeding supplement (PRO-STAT SUGAR FREE 64)  30 mL Oral TID  . folic acid  1 mg Oral Daily  . gabapentin  100 mg Oral QHS  . levETIRAcetam  500 mg Oral Daily  . levETIRAcetam  500 mg Oral Once per day on Tue Thu Sat  . mirtazapine  7.5 mg Oral QHS  .  multivitamin  1 tablet Oral Daily  . omega-3 acid ethyl esters  1 capsule Oral Daily  . polyethylene glycol  17 g Oral Daily  . polyvinyl alcohol  1 drop Both Eyes BID  . sevelamer carbonate  800 mg Oral TID WC  . thiamine  100 mg Oral Daily  . timolol  1 drop Both Eyes TID  . vitamin C  500 mg Oral BID   acetaminophen **OR** acetaminophen, hydrALAZINE, LORazepam **OR** LORazepam, morphine  injection, ondansetron **OR** ondansetron (ZOFRAN) IV, oxyCODONE, sodium chloride  Assessment/ Plan:  [name] is a 67 y.o. 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  [facility] [place] TTS hemodialysis RIJ permcath  Brief hospital course. Patient was admitted on 03/08/2018 from [facility] with eye pain and hypertensive emergency. Emergent hemodialysis treatment following morning for volume overload and blood pressure control. Patient's left eye was found to have vitreous hemorrhage. Hemodialysis on Tuesday, regularly scheduled. Blood pressure remained elevated. Angiogram on Wednesday, determined unsalvagable left lower extremity. Dr. Lucky Cowboy did left BKA on 11/20. Patient was having severe pain post procedure. Friday, patient was noticed having episodes of loss of consciousness. Thought at that time to be hypoglycemic events. However they continued to Saturday where she seemed to have aspirated and prolonged episode of loss of consciousness. Moved to ICU. MRI found to be negative. Hemodialysis Saturday night with hypotension requiring norepinephrine and fluid boluses. Patient then had loss of conscious episode witnessed by the dialysis nurse and me.   1. End Stage Renal Disease  TTS schedule Plan on routine hemodialysis treatment today  In Chair if possible  2. Ischemic limb with  osteomyelitis and endocarditis Status post BKA on 11/20 by Dr. Lucky Cowboy.  Completed course of Cefazolin - end date was 11/22.  Echocardiogram on November 24 showed LVEF 40 to 45%, mild concentric LVH, diffuse hypokinesis, grade 3 diastolic dysfunction  3. Anemia of chronic kidney disease:  Hemoglobin 8.9 - EPO with hemodialysis treatment TTS - Multivitamin daily  4. Hypertension: Now with Hypotension - required vasopressors with hemodialysis treatment this admission -  carvedilol for now.   5. Secondary Hyperparathyroidism: Sevelamer with meals  6. Syncope: concern for seizures. Loss of consciousness episodes. Discussed case with Dr. Irish Elders. MRI reviewed.  -Mental status appears to be improving   LOS: 9 Tinslee Klare 11/26/20198:51 AM

## 2018-03-17 NOTE — Progress Notes (Signed)
[place] [facility] [place] NAME: Stephanie Ellis    MR#:  [id number]  DATE OF BIRTH:  April 18, 1951  SUBJECTIVE:  Alert, awake, oriented today.  Denies any complaints.  More swollen today, with scheduled for dialysis. REVIEW OF SYSTEMS:    Review of Systems  Alert, awake, oriented Cardiovascular; no chest pain, palpitations Pulmonary: No shortness of breath or wheezing Neurologically: Alert, awake, oriented. GI: No nausea, vomiting, no abdominal pain. Skin: Patient has permacath present in left anterior chest.  DRUG ALLERGIES:   Allergies  Allergen Reactions  . Chlorthalidone Anaphylaxis, Itching and Rash  . Fentanyl Rash  . Midazolam Rash  . Ace Inhibitors Other (See Comments)    Reaction:  Hyperkalemia, agitation   . Angiotensin Receptor Blockers Other (See Comments)    Reaction:  Hyperkalemia, agitation   . Norvasc [Amlodipine] Itching and Rash  . Phenergan [Promethazine Hcl] Anxiety    "antsy, can't sit still"    VITALS:  Blood pressure (!) 147/49, pulse (!) 55, temperature 97.7 F (36.5 C), temperature source Oral, resp. rate 14, height 5' 4"  (1.626 m), weight 57.6 kg, SpO2 100 %.  PHYSICAL EXAMINATION:   Constitutional: Appears well-developed and well-nourished. No distress.  Shunt appears edematous today. HENT: Normocephalic. Marland Kitchen Oropharynx is clear and moist.  Eyes: Conjunctivae and EOM are normal. PERRLA, no scleral icterus.  Neck: Normal ROM. Neck supple. No JVD. No tracheal deviation. CVS: RRR, S1/S2 +, no murmurs, no gallops, no carotid bruit.  Pulmonary: Diminished breath sounds bilaterally.  Abdominal: Soft. BS +,  no distension, tenderness, rebound or guarding.  Musculoskeletal: Status post right BKA, left BKA recent with dressing. Neuro: Alert, no focal neurological deficit, bilateral BKA.,    Skin: Skin is warm and dry. No rash noted.  Marland Kitchen Psychiatric: Normal mood and affect.    LABORATORY PANEL:   CBC Recent Labs   Lab 03/17/18 0553  WBC 11.5*  HGB 8.9*  HCT 30.7*  PLT 390   ------------------------------------------------------------------------------------------------------------------  Chemistries  Recent Labs  Lab 03/15/18 1236  NA 136  K 4.3  CL 97*  CO2 28  GLUCOSE 85  BUN 22  CREATININE 4.85*  CALCIUM 8.2*  MG 2.0   ------------------------------------------------------------------------------------------------------------------  Cardiac Enzymes Recent Labs  Lab 03/10/18 1559 03/10/18 2221 03/14/18 2302  TROPONINI 0.04* 0.04* 0.21*   ------------------------------------------------------------------------------------------------------------------  RADIOLOGY:  Dg Chest Port 1 View  Result Date: 03/17/2018 CLINICAL DATA:  Atelectasis EXAM: PORTABLE CHEST 1 VIEW COMPARISON:  02/12/2018 FINDINGS: Cardiac shadow remains enlarged. Dialysis catheter is again noted on the left and stable. Postsurgical changes are again seen. Minimal blunting of the costophrenic angles is noted bilaterally stable from the previous day. No pneumothorax is noted. No focal confluent infiltrate is seen. IMPRESSION: Small bilateral pleural effusions. Electronically Signed   By: Inez Catalina M.D.   On: 03/17/2018 07:41     ASSESSMENT AND PLAN:   66 y.o. 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  2.    Intermittent episodes of unresponsiveness EEG did not show seizure activity, neurology recommends continuing Keppra for total of 7 days and DC unless clinical seizure episode happens.  Patient mentation improved, alert, awake, oriented.  Neurology said they are unclear of unresponsive episodes.  Carotid ultrasound did not show any hemodynamically significant stenosis.  CT head negative for acute stroke. MRI of the brain with contrast could not be ordered as patient is a dialysis patient No further blank stares, patient alert, awake, oriented today.  3.  Diabetes mellitus with complications of PAD and CKD: Continue ADA diet , continue sliding scale insulin with coverage, diet intake continues to be inconsistent. . 4.  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  She had prior laser capsulotomy. Eyedrops recommended at this time.  She will need pars plano vitrectomy-recommended outpatient follow-up after discharge. Aded erythromycin ointment to the eye.  5. Acute hypoxic respiratory failure due to pulmonary edema from end-stage renal disease needing dialysis: Improved, oxygen is weaned off, nephrology to follow for further dialysis needs.  6.  End-stage renal disease with fluid overload Uric acid level is in the normal range on hemodialysis.,  Due to episodes of hypotension did not get dialysis on Saturday, I spoke with Dr. Murlean Iba today patient usually gets dialysis Tuesday, Thursday, Saturday.  Patient will get dialysis today..   7.    History of tricuspid endocarditis-with MSSA.  Currently on cefazolin with dialysis.      8.  Hypertensive urgency resolved: Hypotension requiring pressor support, improved hypotension, now BP holding at 147/49 this morning.  Low-dose Coreg.  Discontinued Lantus and on glucose check to avoid hypoglycemia.  D/w dr Abigail Butts, neurology Dr. Irish Elders and intensivist   Management plans discussed  with the patient and she is in agreement.  CODE STATUS: DNR  TOTAL TIME TAKING CARE OF THIS PATIENT: 35 minutes.  POSSIBLE D/C 2-3 days, DEPENDING ON CLINICAL CONDITION.  More than 50% time spent in counseling, coordination of care. Epifanio Lesches M.D on 03/17/2018 at 11:26 AM  Between 7am to 6pm - Pager - [phone] After 6pm go to [email or web address] - password [email or web address] [facility]  Office  [phone]  CC: Primary care physician; No primary care provider on file.  Note: This dictation was prepared with Dragon dictation along with smaller phrase technology. Any transcriptional errors that result from this process are unintentional.

## 2018-03-17 NOTE — Progress Notes (Signed)
MD made aware that upon neurovascular assessment pt.'s right pupil is irregular shaped but is reactive, her left pupil is difficult to assess due to her sclera/conjunctiva being red, but RN does not see any reaction to light. Pt is alert and oriented to person, place, and time at this time. Pt is still drowsy but responds to voice. No new orders at this time.   Stephanie Ellis [employer]

## 2018-03-17 NOTE — Progress Notes (Signed)
Hemodialysis completed without issue. UF 1.5L as ordered. Tolerated well. Appears to continue to be confused. Very talkative. Asking if its time to go back to [facility]. Report called to primary RN.

## 2018-03-17 NOTE — Progress Notes (Signed)
0010: Patient became apneic, unresponsive even after sternal rubs; AP 70s; BP 188/69; 100% on 2Lnc; RRT called. 0015: on-call Hospitalist paged; unable to arouse patient; periods of apnea and gasping;  0020: patient opened eyes and stared around; RR increased; VSS; able to say who she was and where she was;  0035: Hospitalist called, after several pages and call to the ED; Dr. Duane Boston notified of RRT and patient status at this time, acknowledged; patient hollaring out now; telemetry stable throughout.  Barbaraann Faster, RN 12:41 AM 03/17/2018

## 2018-03-17 NOTE — Progress Notes (Signed)
Inpatient Diabetes Program Recommendations  AACE/ADA: New Consensus Statement on Inpatient Glycemic Control (2019)  Target Ranges:  Prepandial:   less than 140 mg/dL      Peak postprandial:   less than 180 mg/dL (1-2 hours)      Critically ill patients:  140 - 180 mg/dL  Results for CORRENE, LALANI (MRN [id number]) as of 03/17/2018 10:48  Ref. Range 03/17/2018 00:14 03/17/2018 04:23 03/17/2018 07:47  Glucose-Capillary Latest Ref Range: 70 - 99 mg/dL 211 (H) 156 (H) 120 (H)   Results for ANATASIA, TINO (MRN [id number]) as of 03/17/2018 10:48  Ref. Range 03/16/2018 07:38 03/16/2018 11:48 03/16/2018 16:23 03/16/2018 19:29 03/16/2018 23:56  Glucose-Capillary Latest Ref Range: 70 - 99 mg/dL 85 126 (H) 159 (H) 218 (H) 183 (H)   Review of Glycemic Control  Diabetes history: DM2 Outpatient Diabetes medications: Lantus 5 units QHS Current orders for Inpatient glycemic control: CBGs Q4H  Inpatient Diabetes Program Recommendations:  Correction (SSI): Please consider ordering Novolog 0-5 units TID wtih meals (custom scale). Custom Novolog correction scale 0-5 units       151-200  1 unit      201-250  2 units      251-300  3 units      301-350  4 units      351-400  5 units  Thanks, Barnie Alderman, RN, MSN, CDE Diabetes Coordinator Inpatient Diabetes Program [phone] (Team Pager from 8am to 5pm)

## 2018-03-17 NOTE — Clinical Social Work Note (Signed)
Patient has transitioned out of ICU and is on the medical floor again. Patient to discharge back to [facility] when time. Shela Leff MSW,LCSW [phone]

## 2018-03-17 NOTE — Progress Notes (Signed)
Pre HD Tx    03/17/18 1145  Vital Signs  Temp 97.8 F (36.6 C)  Temp Source Oral  Pulse Rate 60  Pulse Rate Source Monitor  Resp 12  BP (!) 119/59  BP Location Right Arm  BP Method Automatic  Patient Position (if appropriate) Lying  Oxygen Therapy  SpO2 100 %  O2 Device Nasal Cannula  O2 Flow Rate (L/min) 2 L/min  Pain Assessment  Pain Scale 0-10  Pain Score 0  Dialysis Weight  Weight 57.6 kg  Type of Weight Pre-Dialysis  Time-Out for Hemodialysis  What Procedure? HD  Pt Identifiers(min of two) First/Last Name;MRN/Account#  Correct Site? Yes  Correct Side? Yes  Correct Procedure? Yes  Consents Verified? Yes  Rad Studies Available? N/A  Safety Precautions Reviewed? Yes  Engineer, civil (consulting) Number  ([id number])  Station Number 2  UF/Alarm Test Passed  Conductivity: Meter 13.5  Conductivity: Machine  13.8  pH 7.4  Reverse Osmosis Main  Normal Saline Lot Number [id number]  Dialyzer Lot Number [id number]  Disposable Set Lot Number [id number]  Machine Temperature 98.6 F (37 C)  Musician and Audible Yes  Blood Lines Intact and Secured Yes  Pre Treatment Patient Checks  Vascular access used during treatment Catheter  Hepatitis B Surface Antigen Results Negative  Date Hepatitis B Surface Antigen Drawn 11/26/17  Hepatitis B Surface Antibody 147  Date Hepatitis B Surface Antibody Drawn 11/26/17  Hemodialysis Consent Verified Yes  Hemodialysis Standing Orders Initiated Yes  ECG (Telemetry) Monitor On Yes  Prime Ordered Normal Saline  Length of  DialysisTreatment -hour(s) 3 Hour(s)  Dialysis Treatment Comments Na 140  Dialyzer Elisio 17H NR  Dialysis Anticoagulant None  Dialysate Flow Ordered 600  Hemodialysis Catheter Left Subclavian Double-lumen  Placement Date: 02/11/18   Placed prior to admission: No  Orientation: Left  Access Location: Subclavian  Hemodialysis Catheter Type: Double-lumen  Site Condition No complications  Blue Lumen Status Flushed  Red Lumen  Status Flushed  Purple Lumen Status N/A  Dressing Type Gauze/Drain sponge;Occlusive  Dressing Status Clean;Dry;Intact  Drainage Description None

## 2018-03-18 ENCOUNTER — Inpatient Hospital Stay
Admission: EM | Admit: 2018-03-18 | Discharge: 2018-03-23 | DRG: 100 | Disposition: A | Discharge status: Skilled Nursing Facility | Payer: Medicare Other | Attending: Internal Medicine | Admitting: Internal Medicine

## 2018-03-18 DIAGNOSIS — Z7982 Long term (current) use of aspirin: Secondary | ICD-10-CM

## 2018-03-18 DIAGNOSIS — Z833 Family history of diabetes mellitus: Secondary | ICD-10-CM

## 2018-03-18 DIAGNOSIS — N2581 Secondary hyperparathyroidism of renal origin: Secondary | ICD-10-CM | POA: Diagnosis present

## 2018-03-18 DIAGNOSIS — Z8249 Family history of ischemic heart disease and other diseases of the circulatory system: Secondary | ICD-10-CM

## 2018-03-18 DIAGNOSIS — D631 Anemia in chronic kidney disease: Secondary | ICD-10-CM | POA: Diagnosis present

## 2018-03-18 DIAGNOSIS — Z89511 Acquired absence of right leg below knee: Secondary | ICD-10-CM

## 2018-03-18 DIAGNOSIS — Z951 Presence of aortocoronary bypass graft: Secondary | ICD-10-CM

## 2018-03-18 DIAGNOSIS — I5032 Chronic diastolic (congestive) heart failure: Secondary | ICD-10-CM | POA: Diagnosis present

## 2018-03-18 DIAGNOSIS — I2510 Atherosclerotic heart disease of native coronary artery without angina pectoris: Secondary | ICD-10-CM | POA: Diagnosis present

## 2018-03-18 DIAGNOSIS — H409 Unspecified glaucoma: Secondary | ICD-10-CM | POA: Diagnosis present

## 2018-03-18 DIAGNOSIS — E1122 Type 2 diabetes mellitus with diabetic chronic kidney disease: Secondary | ICD-10-CM | POA: Diagnosis present

## 2018-03-18 DIAGNOSIS — R55 Syncope and collapse: Secondary | ICD-10-CM | POA: Diagnosis present

## 2018-03-18 DIAGNOSIS — Z598 Other problems related to housing and economic circumstances: Secondary | ICD-10-CM

## 2018-03-18 DIAGNOSIS — Z7901 Long term (current) use of anticoagulants: Secondary | ICD-10-CM

## 2018-03-18 DIAGNOSIS — I252 Old myocardial infarction: Secondary | ICD-10-CM

## 2018-03-18 DIAGNOSIS — Z5989 Other problems related to housing and economic circumstances: Secondary | ICD-10-CM

## 2018-03-18 DIAGNOSIS — Z888 Allergy status to other drugs, medicaments and biological substances status: Secondary | ICD-10-CM

## 2018-03-18 DIAGNOSIS — R4189 Other symptoms and signs involving cognitive functions and awareness: Secondary | ICD-10-CM

## 2018-03-18 DIAGNOSIS — I132 Hypertensive heart and chronic kidney disease with heart failure and with stage 5 chronic kidney disease, or end stage renal disease: Secondary | ICD-10-CM | POA: Diagnosis present

## 2018-03-18 DIAGNOSIS — K219 Gastro-esophageal reflux disease without esophagitis: Secondary | ICD-10-CM | POA: Diagnosis present

## 2018-03-18 DIAGNOSIS — Z89512 Acquired absence of left leg below knee: Secondary | ICD-10-CM

## 2018-03-18 DIAGNOSIS — Z9842 Cataract extraction status, left eye: Secondary | ICD-10-CM

## 2018-03-18 DIAGNOSIS — Z961 Presence of intraocular lens: Secondary | ICD-10-CM | POA: Diagnosis present

## 2018-03-18 DIAGNOSIS — Z9841 Cataract extraction status, right eye: Secondary | ICD-10-CM

## 2018-03-18 DIAGNOSIS — N186 End stage renal disease: Secondary | ICD-10-CM | POA: Diagnosis present

## 2018-03-18 DIAGNOSIS — Z992 Dependence on renal dialysis: Secondary | ICD-10-CM

## 2018-03-18 DIAGNOSIS — R569 Unspecified convulsions: Secondary | ICD-10-CM | POA: Diagnosis not present

## 2018-03-18 DIAGNOSIS — E7800 Pure hypercholesterolemia, unspecified: Secondary | ICD-10-CM | POA: Diagnosis present

## 2018-03-18 DIAGNOSIS — Z66 Do not resuscitate: Secondary | ICD-10-CM | POA: Diagnosis present

## 2018-03-18 DIAGNOSIS — Z800 Family history of malignant neoplasm of digestive organs: Secondary | ICD-10-CM

## 2018-03-18 DIAGNOSIS — Z90710 Acquired absence of both cervix and uterus: Secondary | ICD-10-CM

## 2018-03-18 DIAGNOSIS — E1151 Type 2 diabetes mellitus with diabetic peripheral angiopathy without gangrene: Secondary | ICD-10-CM | POA: Diagnosis present

## 2018-03-18 LAB — BASIC METABOLIC PANEL
Anion gap: 14 (ref 5–15)
BUN: 30 mg/dL — ABNORMAL HIGH (ref 8–23)
CO2: 30 mmol/L (ref 22–32)
Calcium: 8.6 mg/dL — ABNORMAL LOW (ref 8.9–10.3)
Chloride: 96 mmol/L — ABNORMAL LOW (ref 98–111)
Creatinine, Ser: 6.38 mg/dL — ABNORMAL HIGH (ref 0.44–1.00)
GFR calc Af Amer: 7 mL/min — ABNORMAL LOW (ref >60)
GFR calc non Af Amer: 6 mL/min — ABNORMAL LOW (ref >60)
Glucose, Bld: 177 mg/dL — ABNORMAL HIGH (ref 70–99)
Potassium: 4.2 mmol/L (ref 3.5–5.1)
Sodium: 140 mmol/L (ref 135–145)

## 2018-03-18 LAB — GLUCOSE, CAPILLARY
Glucose-Capillary: 132 mg/dL — ABNORMAL HIGH (ref 70–99)
Glucose-Capillary: 193 mg/dL — ABNORMAL HIGH (ref 70–99)

## 2018-03-18 LAB — CBC
HCT: 27.8 % — ABNORMAL LOW (ref 36.0–46.0)
Hemoglobin: 8.3 g/dL — ABNORMAL LOW (ref 12.0–15.0)
MCH: 30.3 pg (ref 26.0–34.0)
MCHC: 29.9 g/dL — ABNORMAL LOW (ref 30.0–36.0)
MCV: 101.5 fL — ABNORMAL HIGH (ref 80.0–100.0)
Platelets: 345 10*3/uL (ref 150–400)
RBC: 2.74 MIL/uL — ABNORMAL LOW (ref 3.87–5.11)
RDW: 20.1 % — ABNORMAL HIGH (ref 11.5–15.5)
WBC: 10.5 10*3/uL (ref 4.0–10.5)
nRBC: 0 % (ref 0.0–0.2)

## 2018-03-18 MED ORDER — MIRTAZAPINE 15 MG PO TABS
7.5000 mg | ORAL_TABLET | Freq: Every day | ORAL | Status: DC
  Administered 2018-03-19 – 2018-03-21 (×4): 7.5 mg via ORAL
  Filled 2018-03-18 (×4): qty 1

## 2018-03-18 MED ORDER — ATORVASTATIN CALCIUM 20 MG PO TABS
80.0000 mg | ORAL_TABLET | Freq: Every day | ORAL | Status: DC
  Administered 2018-03-19 – 2018-03-22 (×5): 80 mg via ORAL
  Filled 2018-03-18 (×5): qty 4

## 2018-03-18 MED ORDER — TIMOLOL MALEATE 0.5 % OP SOLN: 1.0000 [drp] | Freq: Three times a day (TID) | OPHTHALMIC | 12 refills | Status: AC

## 2018-03-18 MED ORDER — ISOSORBIDE MONONITRATE ER 30 MG PO TB24
30.0000 mg | ORAL_TABLET | Freq: Every day | ORAL | Status: DC
  Administered 2018-03-20 – 2018-03-23 (×4): 30 mg via ORAL
  Filled 2018-03-18 (×4): qty 1

## 2018-03-18 MED ORDER — DOCUSATE SODIUM 100 MG PO CAPS
100.0000 mg | ORAL_CAPSULE | Freq: Two times a day (BID) | ORAL | Status: DC
  Administered 2018-03-19 – 2018-03-23 (×9): 100 mg via ORAL
  Filled 2018-03-18 (×9): qty 1

## 2018-03-18 MED ORDER — POLYETHYL GLYCOL-PROPYL GLYCOL 0.4-0.3 % OP SOLN: 1.0000 [drp] | Freq: Two times a day (BID) | OPHTHALMIC | Status: DC

## 2018-03-18 MED ORDER — APIXABAN 2.5 MG PO TABS
2.5000 mg | ORAL_TABLET | Freq: Two times a day (BID) | ORAL | Status: DC
  Administered 2018-03-19 – 2018-03-23 (×9): 2.5 mg via ORAL
  Filled 2018-03-18 (×11): qty 1

## 2018-03-18 MED ORDER — TIMOLOL MALEATE 0.5 % OP SOLN
1.0000 [drp] | Freq: Three times a day (TID) | OPHTHALMIC | Status: DC
  Administered 2018-03-19 – 2018-03-23 (×14): 1 [drp] via OPHTHALMIC
  Filled 2018-03-18: qty 5

## 2018-03-18 MED ORDER — OMEGA-3-ACID ETHYL ESTERS 1 G PO CAPS
2.0000 g | ORAL_CAPSULE | Freq: Every day | ORAL | Status: DC
  Administered 2018-03-20 – 2018-03-23 (×4): 2 g via ORAL
  Filled 2018-03-18 (×5): qty 2

## 2018-03-18 MED ORDER — PRORENAL + D PO TABS: 1.0000 | ORAL_TABLET | Freq: Every day | ORAL | Status: DC

## 2018-03-18 MED ORDER — MIDODRINE HCL 5 MG PO TABS
10.0000 mg | ORAL_TABLET | ORAL | Status: DC
  Administered 2018-03-20 – 2018-03-23 (×2): 10 mg via ORAL
  Filled 2018-03-18 (×2): qty 2

## 2018-03-18 MED ORDER — CARVEDILOL 6.25 MG PO TABS: 6.2500 mg | ORAL_TABLET | Freq: Two times a day (BID) | ORAL | Status: DC

## 2018-03-18 MED ORDER — LEVETIRACETAM 500 MG PO TABS: 500.0000 mg | ORAL_TABLET | ORAL | 0 refills | Status: DC

## 2018-03-18 MED ORDER — BRIMONIDINE TARTRATE 0.2 % OP SOLN
1.0000 [drp] | Freq: Three times a day (TID) | OPHTHALMIC | Status: DC
  Administered 2018-03-19 – 2018-03-23 (×14): 1 [drp] via OPHTHALMIC
  Filled 2018-03-18: qty 5

## 2018-03-18 MED ORDER — FAMOTIDINE 20 MG PO TABS
20.0000 mg | ORAL_TABLET | Freq: Every day | ORAL | Status: DC
  Administered 2018-03-20 – 2018-03-23 (×4): 20 mg via ORAL
  Filled 2018-03-18 (×4): qty 1

## 2018-03-18 MED ORDER — RENA-VITE PO TABS
1.0000 | ORAL_TABLET | Freq: Every day | ORAL | Status: DC
  Administered 2018-03-20 – 2018-03-23 (×4): 1 via ORAL
  Filled 2018-03-18 (×5): qty 1

## 2018-03-18 MED ORDER — OXYCODONE HCL 5 MG PO TABS: 5.0000 mg | ORAL_TABLET | Freq: Four times a day (QID) | ORAL | 0 refills | Status: DC | PRN

## 2018-03-18 MED ORDER — CARVEDILOL 6.25 MG PO TABS
6.2500 mg | ORAL_TABLET | Freq: Two times a day (BID) | ORAL | Status: DC
  Administered 2018-03-19 – 2018-03-23 (×9): 6.25 mg via ORAL
  Filled 2018-03-18 (×9): qty 1

## 2018-03-18 MED ORDER — FOLIC ACID 1 MG PO TABS
1.0000 mg | ORAL_TABLET | Freq: Every day | ORAL | Status: DC
  Administered 2018-03-20 – 2018-03-23 (×4): 1 mg via ORAL
  Filled 2018-03-18 (×4): qty 1

## 2018-03-18 MED ORDER — VITAMIN B-12 1000 MCG PO TABS
500.0000 ug | ORAL_TABLET | Freq: Every day | ORAL | Status: DC
  Administered 2018-03-20 – 2018-03-23 (×4): 500 ug via ORAL
  Filled 2018-03-18 (×4): qty 1
  Filled 2018-03-18: qty 0.5

## 2018-03-18 MED ORDER — CALCITRIOL 0.25 MCG PO CAPS
0.5000 ug | ORAL_CAPSULE | Freq: Every day | ORAL | Status: DC
  Administered 2018-03-20 – 2018-03-23 (×4): 0.5 ug via ORAL
  Filled 2018-03-18 (×5): qty 2

## 2018-03-18 MED ORDER — SEVELAMER CARBONATE 800 MG PO TABS
800.0000 mg | ORAL_TABLET | Freq: Three times a day (TID) | ORAL | Status: DC
  Administered 2018-03-19 – 2018-03-23 (×11): 800 mg via ORAL
  Filled 2018-03-18 (×13): qty 1

## 2018-03-18 MED ORDER — BRIMONIDINE TARTRATE 0.2 % OP SOLN: 1.0000 [drp] | Freq: Three times a day (TID) | OPHTHALMIC | 12 refills | Status: DC

## 2018-03-18 MED ORDER — LEVETIRACETAM 500 MG PO TABS: 500.0000 mg | ORAL_TABLET | ORAL | Status: DC

## 2018-03-18 MED ORDER — OMEGA-3 300 MG PO CAPS: 2.0000 | ORAL_CAPSULE | Freq: Every day | ORAL | Status: DC

## 2018-03-18 MED ORDER — ASPIRIN EC 81 MG PO TBEC
81.0000 mg | DELAYED_RELEASE_TABLET | Freq: Every day | ORAL | Status: DC
  Administered 2018-03-20 – 2018-03-23 (×4): 81 mg via ORAL
  Filled 2018-03-18 (×4): qty 1

## 2018-03-18 MED ORDER — OXYCODONE HCL 5 MG PO TABS
5.0000 mg | ORAL_TABLET | Freq: Four times a day (QID) | ORAL | Status: DC | PRN
  Filled 2018-03-18: qty 1

## 2018-03-18 MED ORDER — ONDANSETRON HCL 4 MG PO TABS
4.0000 mg | ORAL_TABLET | Freq: Three times a day (TID) | ORAL | Status: DC | PRN
  Administered 2018-03-19: 4 mg via ORAL
  Filled 2018-03-18: qty 1

## 2018-03-18 NOTE — ED Notes (Signed)
Spoke to charge nurse at [facility] , she reports that the on call MD will not take pt back till she has a clear telemetry stay till this coming Monday .

## 2018-03-18 NOTE — Discharge Summary (Signed)
[facility] NAME: Stephanie Ellis    MR#:  [id number]  DATE OF BIRTH:  23-Jun-1950  DATE OF ADMISSION:  03/08/2018 ADMITTING PHYSICIAN: Bettey Costa, MD  DATE OF DISCHARGE: 03/18/2018  PRIMARY CARE PHYSICIAN: No primary care provider on file.    ADMISSION DIAGNOSIS:  Hypertensive urgency [I16.0] Left eye pain [H57.12] Nonspecific chest pain [R07.9] Hypertension, unspecified type [I10]  DISCHARGE DIAGNOSIS:  Active Problems:   Hypertensive urgency   Disorientation   Transient alteration of awareness   VT (ventricular tachycardia) (HCC)   SECONDARY DIAGNOSIS:   Past Medical History:  Diagnosis Date  . (HFpEF) heart failure with preserved ejection fraction ([place])    a. 01/2018 Echo: EF 50-55%, no rwma, Gr1 DD, triv AI, mild MR. Midly dil LA/RV, mod reduced RV fxn. Irreg thickening of TV w/ mobile echodensity that appears to arise from valve.  . Anemia   . Anorexia   . Bacteremia due to Pseudomonas   . Carotid arterial disease ([place])    a. 01/2017 Carotid U/S: RICA <51, LICA <02.  Marland Kitchen Complication of anesthesia    a. Pt reports h/o complication on 9 different occasions - ? hypotension/arrest.  . 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 01/2015 Cath [facility]): 70% LM, p-mLAD 50-60% (Resting FFR 0.75), mRCA 80-90%, ~40 Ost OM & D1-->CABG; b. 01/2016 Staged PCI of LCX x 2 and RCA.  Marland Kitchen ESRD (end stage renal disease) on dialysis ([place])    a. ESRD secondary to acute kidney failure s/p CABG-->PD  . Essential hypertension   . GERD (gastroesophageal reflux disease)   . Heart murmur   . Hypercholesterolemia   . Myocardial infarction ([place]) 2016  . PVD (peripheral vascular disease) ([employer])    a. 01/30/2018 PV Angio: Sev Left Popliteal dzs s/p PTA w/ 42m lutonix DEB w/ mech aspiration of the L popliteal, L AT, and Left tibioperoneal trunck and peroneal artery.  . 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  [facility]: Dr. BMarland Kitchen [place]cock: CABG x 3, LIMA to LAD, SVG to RCA, SVG to OM3, EVH  . Sinusitis 2019  . Type II diabetes mellitus with complication (HCC)    CAD    HOSPITAL COURSE:   67year old female with a history of diabetes, end-stage renal disease on hemodialysis and tricuspid endocarditis with MSSA who presented to the emergency room due to shortness of breath.  1.  Acute hypoxic respiratory failure due to pulmonary edema requiring dialysis  2.  Hypertensive urgency: Patient was treated and her blood pressure is improved  3.  Intermittent episodes of unresponsiveness: Patient had several episodes of unresponsiveness with unclear etiology.  She underwent head CT x2.  She was evaluated neurology.  He she had an EEG which did not show evidence of seizure.  Neurology however did recommend 7 days of Keppra.  This is renally dosed.  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 She had prior laser capsulotomy. Eyedrops recommended at this time. She will need pars plano vitrectomy-recommended outpatient follow-up after discharge. 5.  End-stage renal disease: Patient will continue on regular dialysis schedule.  6.  History of tricuspid endocarditis/MSSA: Patient is completed antibiotic therapy.  7.  Diabetes: Patient's blood sugars are low and therefore medications were discontinued for now.  8.  Postoperative day #7 status post left BKA: Patient will follow-up with vascular surgery for wound check/staple remover.  Continue daily dressing changes.  DISCHARGE CONDITIONS AND DIET:   Stable for discharge on renal diet  CONSULTS OBTAINED:  Treatment Team:  Albertine Patricia, Pilar Plate, MD  DRUG ALLERGIES:   Allergies  Allergen Reactions  . Chlorthalidone Anaphylaxis, Itching and Rash  . Fentanyl Rash  . Midazolam Rash  . Ace Inhibitors Other (See Comments)    Reaction:  Hyperkalemia, agitation   . Angiotensin Receptor Blockers Other (See Comments)     Reaction:  Hyperkalemia, agitation   . Norvasc [Amlodipine] Itching and Rash  . Phenergan [Promethazine Hcl] Anxiety    "antsy, can't sit still"    DISCHARGE MEDICATIONS:   Allergies as of 03/18/2018      Reactions   Chlorthalidone Anaphylaxis, Itching, Rash   Fentanyl Rash   Midazolam Rash   Ace Inhibitors Other (See Comments)   Reaction:  Hyperkalemia, agitation    Angiotensin Receptor Blockers Other (See Comments)   Reaction:  Hyperkalemia, agitation    Norvasc [amlodipine] Itching, Rash   Phenergan [promethazine Hcl] Anxiety   "antsy, can't sit still"      Medication List    STOP taking these medications   ceFAZolin  IVPB Commonly known as:  ANCEF   insulin glargine 100 unit/mL Sopn Commonly known as:  LANTUS     TAKE these medications   apixaban 2.5 MG Tabs tablet Commonly known as:  ELIQUIS Take 1 tablet (2.5 mg total) by mouth 2 (two) times daily.   aspirin EC 81 MG tablet Take 1 tablet (81 mg total) by mouth daily.   atorvastatin 80 MG tablet Commonly known as:  LIPITOR Take 80 mg by mouth at bedtime.   brimonidine 0.2 % ophthalmic solution Commonly known as:  ALPHAGAN Place 1 drop into both eyes 3 (three) times daily.   calcitRIOL 0.5 MCG capsule Commonly known as:  ROCALTROL Take 0.5 mcg by mouth daily.   carvedilol 6.25 MG tablet Commonly known as:  COREG Take 1 tablet (6.25 mg total) by mouth 2 (two) times daily. What changed:    medication strength  how much to take   docusate sodium 100 MG capsule Commonly known as:  COLACE Take 1 capsule (100 mg total) by mouth 2 (two) times daily.   feeding supplement (NEPRO CARB STEADY) Liqd Take 237 mLs by mouth 2 (two) times daily between meals.   folic acid 1 MG tablet Commonly known as:  FOLVITE Take 1 tablet (1 mg total) by mouth daily.   isosorbide mononitrate 30 MG 24 hr tablet Commonly known as:  IMDUR Take 30 mg by mouth daily.   levETIRAcetam 500 MG tablet Commonly known as:   KEPPRA Take 1 tablet (500 mg total) by mouth 3 (three) times a week for 3 days. Start taking on:  03/19/2018   midodrine 10 MG tablet Commonly known as:  PROAMATINE Take 1 tablet (10 mg total) by mouth 3 (three) times a week. What changed:  additional instructions   mirtazapine 7.5 MG tablet Commonly known as:  REMERON Take 7.5 mg by mouth at bedtime.   multivitamin with minerals Tabs tablet Take 2 tablets by mouth daily. Gummy vitamins   nitroGLYCERIN 0.4 MG SL tablet Commonly known as:  NITROSTAT Place 1 tablet (0.4 mg total) under the tongue every 5 (five) minutes as needed for chest pain.   nizatidine 150 MG capsule Commonly known as:  AXID Take 1 capsule (150 mg total) by mouth 2 (two) times daily.   Omega-3 300 MG Caps Take 2 capsules by mouth daily.  ondansetron 4 MG tablet Commonly known as:  ZOFRAN Take 4 mg by mouth every 8 (eight) hours as needed for nausea or vomiting.   oxyCODONE 5 MG immediate release tablet Commonly known as:  Oxy IR/ROXICODONE Take 1 tablet (5 mg total) by mouth every 6 (six) hours as needed for moderate pain or severe pain.   Polyethyl Glycol-Propyl Glycol 0.4-0.3 % Soln Place 1 drop into both eyes 2 (two) times daily.   PRORENAL + D Tabs Take 1 tablet by mouth daily.   sevelamer carbonate 800 MG tablet Commonly known as:  RENVELA Take 800 mg by mouth 3 (three) times daily with meals.   timolol 0.5 % ophthalmic solution Commonly known as:  TIMOPTIC Place 1 drop into both eyes 3 (three) times daily.   vitamin B-12 1000 MCG tablet Commonly known as:  CYANOCOBALAMIN Take 1 tablet (1,000 mcg total) by mouth daily. What changed:  how much to take            Discharge Care Instructions  (From admission, onward)         Start     Ordered   03/18/18 0000  Discharge wound care:    Comments:  Daily dressing change   03/18/18 0958            Today   CHIEF COMPLAINT:   Patient with agitation last night this am no  issues   VITAL SIGNS:  Blood pressure (!) 144/56, pulse 67, temperature (!) 97.5 F (36.4 C), temperature source Oral, resp. rate 14, height 5' 4"  (1.626 m), weight 56 kg, SpO2 100 %.   REVIEW OF SYSTEMS:  Review of Systems  Constitutional: Negative.  Negative for chills, fever and malaise/fatigue.  HENT: Negative.  Negative for ear discharge, ear pain, hearing loss, nosebleeds and sore throat.   Eyes: Negative.  Negative for blurred vision and pain.  Respiratory: Negative.  Negative for cough, hemoptysis, shortness of breath and wheezing.   Cardiovascular: Negative.  Negative for chest pain, palpitations and leg swelling.  Gastrointestinal: Negative.  Negative for abdominal pain, blood in stool, diarrhea, nausea and vomiting.  Genitourinary: Negative.  Negative for dysuria.  Musculoskeletal: Negative.  Negative for back pain.  Skin: Negative.   Neurological: Negative for dizziness, tremors, speech change, focal weakness, seizures and headaches.  Endo/Heme/Allergies: Negative.  Does not bruise/bleed easily.  Psychiatric/Behavioral: Negative.  Negative for depression, hallucinations and suicidal ideas.     PHYSICAL EXAMINATION:  GENERAL:  67 y.o.-year-old patient lying in the bed with no acute distress.  NECK:  Supple, no jugular venous distention. No thyroid enlargement, no tenderness.  LUNGS: Normal breath sounds bilaterally, no wheezing, rales,rhonchi  No use of accessory muscles of respiration.  CARDIOVASCULAR: S1, S2 normal. No murmurs, rubs, or gallops.  ABDOMEN: Soft, non-tender, non-distended. Bowel sounds present. No organomegaly or mass.  EXTREMITIES: b/l BKA PSYCHIATRIC: The patient is alert and oriented x 3.  SKIN: No obvious rash, lesion, or ulcer.   DATA REVIEW:   CBC Recent Labs  Lab 03/17/18 0553  WBC 11.5*  HGB 8.9*  HCT 30.7*  PLT 390    Chemistries  Recent Labs  Lab 03/15/18 1236  NA 136  K 4.3  CL 97*  CO2 28  GLUCOSE 85  BUN 22  CREATININE  4.85*  CALCIUM 8.2*  MG 2.0    Cardiac Enzymes Recent Labs  Lab 03/14/18 2302  TROPONINI 0.21*    Microbiology Results  @[email or web address] @  RADIOLOGY:  Ct Head Wo Contrast  Result  Date: 03/17/2018 CLINICAL DATA:  67 y/o  F; confusion and agitation. EXAM: CT HEAD WITHOUT CONTRAST TECHNIQUE: Contiguous axial images were obtained from the base of the skull through the vertex without intravenous contrast. COMPARISON:  03/14/2018 MRI head. 03/13/2018 CT head. FINDINGS: Brain: No evidence of acute infarction, hemorrhage, hydrocephalus, extra-axial collection or mass lesion/mass effect. Stable chronic microvascular ischemic changes and volume loss of the brain. 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 Vascular: Calcific atherosclerosis of the carotid siphons and vertebral arteries. No hyperdense vessel identified. Skull: Normal. Negative for fracture or focal lesion. Sinuses/Orbits: Stable opacification of the left sphenoid sinus extending into left posterior ethmoid air cells, possibly underlying polyp or mucocele. Additional visible paranasal sinuses and the mastoid air cells are normally aerated. Bilateral intra-ocular lens replacement. Other: None. IMPRESSION: 1. No acute intracranial abnormality identified. 2. Stable chronic microvascular ischemic changes and volume loss of the brain. 3. Stable opacification of left sphenoid and posterior ethmoid air cells with mild expansion, possibly underlying polyp or mucocele. Electronically Signed   By: Kristine Garbe M.D.   On: 03/17/2018 20:58   Dg Chest Port 1 View  Result Date: 03/17/2018 CLINICAL DATA:  Atelectasis EXAM: PORTABLE CHEST 1 VIEW COMPARISON:  02/12/2018 FINDINGS: Cardiac shadow remains enlarged. Dialysis catheter is again noted on the left and stable. Postsurgical changes are again seen. Minimal blunting of the costophrenic angles is noted bilaterally stable from the previous day. No pneumothorax is  noted. No focal confluent infiltrate is seen. IMPRESSION: Small bilateral pleural effusions. Electronically Signed   By: Inez Catalina M.D.   On: 03/17/2018 07:41      Allergies as of 03/18/2018      Reactions   Chlorthalidone Anaphylaxis, Itching, Rash   Fentanyl Rash   Midazolam Rash   Ace Inhibitors Other (See Comments)   Reaction:  Hyperkalemia, agitation    Angiotensin Receptor Blockers Other (See Comments)   Reaction:  Hyperkalemia, agitation    Norvasc [amlodipine] Itching, Rash   Phenergan [promethazine Hcl] Anxiety   "antsy, can't sit still"      Medication List    STOP taking these medications   ceFAZolin  IVPB Commonly known as:  ANCEF   insulin glargine 100 unit/mL Sopn Commonly known as:  LANTUS     TAKE these medications   apixaban 2.5 MG Tabs tablet Commonly known as:  ELIQUIS Take 1 tablet (2.5 mg total) by mouth 2 (two) times daily.   aspirin EC 81 MG tablet Take 1 tablet (81 mg total) by mouth daily.   atorvastatin 80 MG tablet Commonly known as:  LIPITOR Take 80 mg by mouth at bedtime.   brimonidine 0.2 % ophthalmic solution Commonly known as:  ALPHAGAN Place 1 drop into both eyes 3 (three) times daily.   calcitRIOL 0.5 MCG capsule Commonly known as:  ROCALTROL Take 0.5 mcg by mouth daily.   carvedilol 6.25 MG tablet Commonly known as:  COREG Take 1 tablet (6.25 mg total) by mouth 2 (two) times daily. What changed:    medication strength  how much to take   docusate sodium 100 MG capsule Commonly known as:  COLACE Take 1 capsule (100 mg total) by mouth 2 (two) times daily.   feeding supplement (NEPRO CARB STEADY) Liqd Take 237 mLs by mouth 2 (two) times daily between meals.   folic acid 1 MG tablet Commonly known as:  FOLVITE Take 1 tablet (1 mg total) by mouth daily.   isosorbide mononitrate 30 MG 24  hr tablet Commonly known as:  IMDUR Take 30 mg by mouth daily.   levETIRAcetam 500 MG tablet Commonly known as:  KEPPRA Take  1 tablet (500 mg total) by mouth 3 (three) times a week for 3 days. Start taking on:  03/19/2018   midodrine 10 MG tablet Commonly known as:  PROAMATINE Take 1 tablet (10 mg total) by mouth 3 (three) times a week. What changed:  additional instructions   mirtazapine 7.5 MG tablet Commonly known as:  REMERON Take 7.5 mg by mouth at bedtime.   multivitamin with minerals Tabs tablet Take 2 tablets by mouth daily. Gummy vitamins   nitroGLYCERIN 0.4 MG SL tablet Commonly known as:  NITROSTAT Place 1 tablet (0.4 mg total) under the tongue every 5 (five) minutes as needed for chest pain.   nizatidine 150 MG capsule Commonly known as:  AXID Take 1 capsule (150 mg total) by mouth 2 (two) times daily.   Omega-3 300 MG Caps Take 2 capsules by mouth daily.   ondansetron 4 MG tablet Commonly known as:  ZOFRAN Take 4 mg by mouth every 8 (eight) hours as needed for nausea or vomiting.   oxyCODONE 5 MG immediate release tablet Commonly known as:  Oxy IR/ROXICODONE Take 1 tablet (5 mg total) by mouth every 6 (six) hours as needed for moderate pain or severe pain.   Polyethyl Glycol-Propyl Glycol 0.4-0.3 % Soln Place 1 drop into both eyes 2 (two) times daily.   PRORENAL + D Tabs Take 1 tablet by mouth daily.   sevelamer carbonate 800 MG tablet Commonly known as:  RENVELA Take 800 mg by mouth 3 (three) times daily with meals.   timolol 0.5 % ophthalmic solution Commonly known as:  TIMOPTIC Place 1 drop into both eyes 3 (three) times daily.   vitamin B-12 1000 MCG tablet Commonly known as:  CYANOCOBALAMIN Take 1 tablet (1,000 mcg total) by mouth daily. What changed:  how much to take            Discharge Care Instructions  (From admission, onward)         Start     Ordered   03/18/18 0000  Discharge wound care:    Comments:  Daily dressing change   03/18/18 0958             Management plans discussed with the patient and she is in agreement. Stable for discharge  snf with outpatient palliative care services  Patient should follow up with vascular surgery  CODE STATUS:     Code Status Orders  (From admission, onward)         Start     Ordered   03/14/18 2311  Do not attempt resuscitation (DNR)  Continuous    Question Answer Comment  In the event of cardiac or respiratory ARREST Do not call a "code blue"   In the event of cardiac or respiratory ARREST Do not perform Intubation, CPR, defibrillation or ACLS   In the event of cardiac or respiratory ARREST Use medication by any route, position, wound care, and other measures to relive pain and suffering. May use oxygen, suction and manual treatment of airway obstruction as needed for comfort.   Comments DNI      03/14/18 2310        Code Status History    Date Active Date Inactive Code Status Order ID Comments User Context   03/10/2018 0837 03/14/2018 2310 Full Code [id number]  Gladstone Lighter, MD Inpatient   02/18/2018  1000 02/28/2018 2122 Full Code [id number]  Loletha Grayer, MD ED   02/01/2018 0412 02/15/2018 2250 Full Code [id number]  Arta Silence, MD Inpatient   01/31/2018 2300 02/01/2018 0412 DNR [id number]  Amelia Jo, MD Inpatient   01/31/2018 2037 01/31/2018 2300 DNR [id number]  Awilda Bill, NP Inpatient   01/29/2018 2250 01/31/2018 2037 Full Code [id number]  Amelia Jo, MD Inpatient   10/24/2016 1055 10/25/2016 2243 Full Code [id number]  Hillary Bow, MD ED   10/17/2016 2143 10/19/2016 1440 Full Code [id number]  Vaughan Basta, MD Inpatient   09/04/2016 1708 09/05/2016 1500 Full Code [id number]  Epifanio Lesches, MD ED   08/22/2016 0938 08/22/2016 1833 Full Code [id number]  Algernon Huxley, MD Inpatient   06/27/2016 1121 06/27/2016 1846 Full Code [id number]  Algernon Huxley, MD Inpatient   10/03/2015 2235 10/04/2015 1724 Full Code [id number]  Harrie Foreman, MD Inpatient      TOTAL TIME TAKING CARE OF THIS PATIENT: 38 minutes.    Note: This dictation was prepared with  Dragon dictation along with smaller phrase technology. Any transcriptional errors that result from this process are unintentional.  Jojo Pehl M.D on 03/18/2018 at 11:34 AM  Between 7am to 6pm - Pager - [phone] After 6pm go to [email or web address] - password [email or web address] [facility]  Office  [phone]  CC: Primary care physician; No primary care provider on file.

## 2018-03-18 NOTE — ED Notes (Signed)
Pt cleaned of  Stool, changed ,and lifted up in bed .

## 2018-03-18 NOTE — Progress Notes (Signed)
[facility]  Daily Progress Note   Subjective  - 7 Days Post-Op  Sleepy now.  Seems more lethargic after dialysis. Neuro work up was negative  Objective Vitals:   03/17/18 1532 03/17/18 2021 03/18/18 0457 03/18/18 0849  BP: (!) 156/53 (!) 119/41 (!) 138/43 (!) 144/56  Pulse: 67 76 67   Resp: 16 16 14    Temp: 98.3 F (36.8 C) 98.8 F (37.1 C) (!) 97.5 F (36.4 C)   TempSrc: Oral Oral Oral   SpO2: 100% 94% 100%   Weight:      Height:        Intake/Output Summary (Last 24 hours) at 03/18/2018 1011 Last data filed at 03/17/2018 2021 Gross per 24 hour  Intake -  Output 1508 ml  Net -1508 ml    PULM  CTAB CV  RRR VASC  Stump with mild to moderate bruising medial and laterally but no signs of infection.  Incision is intact  Laboratory CBC    Component Value Date/Time   WBC 11.5 (H) 03/17/2018 0553   HGB 8.9 (L) 03/17/2018 0553   HCT 30.7 (L) 03/17/2018 0553   PLT 390 03/17/2018 0553    BMET    Component Value Date/Time   NA 136 03/15/2018 1236   K 4.3 03/15/2018 1236   CL 97 (L) 03/15/2018 1236   CO2 28 03/15/2018 1236   GLUCOSE 85 03/15/2018 1236   BUN 22 03/15/2018 1236   CREATININE 4.85 (H) 03/15/2018 1236   CALCIUM 8.2 (L) 03/15/2018 1236   GFRNONAA 8 (L) 03/15/2018 1236   GFRAA 10 (L) 03/15/2018 1236    Assessment/Planning: POD #7 s/p left BKA   MS poor after dialysis but work up was negative  Stump looks OK.  Follow up with [place] in 2-3 weeks for wound check/staple removal  We will sign off, please call with questions.    Leotis Pain  03/18/2018, 10:11 AM

## 2018-03-18 NOTE — Care Management (Signed)
Elvera Bicker dialysis liaison notified of discharge

## 2018-03-18 NOTE — Progress Notes (Signed)
[facility]  ROUNDING NOTE   Subjective:   Patient became agitated and confused last night.  This morning she is very sleepy and did not wake up for conversation.  Brother at bedside.   Objective:  Vital signs in last 24 hours:  Temp:  [97.5 F (36.4 C)-98.8 F (37.1 C)] 97.5 F (36.4 C) (11/27 0457) Pulse Rate:  [51-83] 67 (11/27 0457) Resp:  [6-17] 14 (11/27 0457) BP: (108-174)/(41-88) 144/56 (11/27 0849) SpO2:  [93 %-100 %] 100 % (11/27 0457) Weight:  [56 kg] 56 kg (11/26 1502)  Weight change:  Filed Weights   03/15/18 0015 03/17/18 1145 03/17/18 1502  Weight: 57.6 kg 57.6 kg 56 kg    Intake/Output: I/O last 3 completed shifts: In: 48 [P.O.:360; IV Piggyback:100] Out: 1508 [Other:1508]   Intake/Output this shift:  Total I/O In: 120 [P.O.:120] Out: -   Physical Exam: General: NAD, laying in bed  Head: Normocephalic, atraumatic. Moist oral mucosal membranes  Eyes: Anicteric,   Neck: Supple,  Lungs:   Normal breathing effort,   Heart: Regular rate and rhythm  Abdomen:  Soft, nontender,   Extremities:  bilateral BKA   Neurologic:  Sedated  Access: Left IJ permcath    Basic Metabolic Panel: Recent Labs  Lab 03/13/18 1803  03/13/18 1815 03/14/18 1029 03/14/18 2123 03/15/18 1236  NA  --   --  136 139 135 136  K  --   --  4.9 4.7 4.6 4.3  CL  --   --  97* 95* 94* 97*  CO2  --   --  26 29 27 28   GLUCOSE  --   --  143* 121* 207* 85  BUN  --   --  25* 36* 42* 22  CREATININE  --   --  5.08* 6.42* 7.18* 4.85*  CALCIUM  --    < > 8.6* 9.0 8.5* 8.2*  MG 2.1  --   --   --  2.1 2.0  PHOS  --   --   --  5.3* 4.9* 3.3   < > = values in this interval not displayed.    Liver Function Tests: Recent Labs  Lab 03/14/18 1029  ALBUMIN 2.2*   No results for input(s): LIPASE, AMYLASE in the last 168 hours. No results for input(s): AMMONIA in the last 168 hours.  CBC: Recent Labs  Lab 03/13/18 0313 03/14/18 1029 03/14/18 2302 03/15/18 0652  03/17/18 0553  WBC 14.1* 16.6* 16.3* 12.9* 11.5*  NEUTROABS  --   --  13.8* 9.6* 8.5*  HGB 9.3* 8.6* 8.4* 8.6* 8.9*  HCT 32.0* 29.6* 28.3* 28.9* 30.7*  MCV 103.9* 103.5* 102.9* 102.8* 103.7*  PLT 348 387 357 354 390    Cardiac Enzymes: Recent Labs  Lab 03/14/18 2302  TROPONINI 0.21*    BNP: Invalid input(s): POCBNP  CBG: Recent Labs  Lab 03/17/18 0747 03/17/18 1530 03/17/18 1730 03/17/18 2022 03/18/18 0738  GLUCAP 120* 103* 144* 158* 132*    Microbiology: Results for orders placed or performed during the hospital encounter of 03/08/18  MRSA PCR Screening     Status: None   Collection Time: 03/10/18  8:24 AM  Result Value Ref Range Status   MRSA by PCR NEGATIVE NEGATIVE Final    Comment:        The GeneXpert MRSA Assay (FDA approved for NASAL specimens only), is one component of a comprehensive MRSA colonization surveillance program. It is not intended to diagnose MRSA infection nor to guide  or monitor treatment for MRSA infections. Performed at [facility], [address] [place], [place] [address]   CULTURE, BLOOD (ROUTINE X 2) w Reflex to ID Panel     Status: None (Preliminary result)   Collection Time: 03/14/18  6:08 PM  Result Value Ref Range Status   Specimen Description BLOOD LEFT ANTECUBITAL  Final   Special Requests   Final    BOTTLES DRAWN AEROBIC AND ANAEROBIC Blood Culture results may not be optimal due to an excessive volume of blood received in culture bottles   Culture   Final    NO GROWTH 4 DAYS Performed at [facility], [address], [place], [place] [address]    Report Status PENDING  Incomplete  CULTURE, BLOOD (ROUTINE X 2) w Reflex to ID Panel     Status: None (Preliminary result)   Collection Time: 03/14/18  7:27 PM  Result Value Ref Range Status   Specimen Description BLOOD LEFT ANTECUBITAL  Final   Special Requests BOTTLES DRAWN AEROBIC AND ANAEROBIC  Final   Culture   Final    NO GROWTH 4 DAYS Performed  at [facility], [address] [place], [place] [address]    Report Status PENDING  Incomplete    Coagulation Studies: No results for input(s): LABPROT, INR in the last 72 hours.  Urinalysis: No results for input(s): COLORURINE, LABSPEC, PHURINE, GLUCOSEU, HGBUR, BILIRUBINUR, KETONESUR, PROTEINUR, UROBILINOGEN, NITRITE, LEUKOCYTESUR in the last 72 hours.  Invalid input(s): APPERANCEUR    Imaging: Ct Head Wo Contrast  Result Date: 03/17/2018 CLINICAL DATA:  67 y/o  F; confusion and agitation. EXAM: CT HEAD WITHOUT CONTRAST TECHNIQUE: Contiguous axial images were obtained from the base of the skull through the vertex without intravenous contrast. COMPARISON:  03/14/2018 MRI head. 03/13/2018 CT head. FINDINGS: Brain: No evidence of acute infarction, hemorrhage, hydrocephalus, extra-axial collection or mass lesion/mass effect. Stable chronic microvascular ischemic changes and volume loss of the brain. 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 Vascular: Calcific atherosclerosis of the carotid siphons and vertebral arteries. No hyperdense vessel identified. Skull: Normal. Negative for fracture or focal lesion. Sinuses/Orbits: Stable opacification of the left sphenoid sinus extending into left posterior ethmoid air cells, possibly underlying polyp or mucocele. Additional visible paranasal sinuses and the mastoid air cells are normally aerated. Bilateral intra-ocular lens replacement. Other: None. IMPRESSION: 1. No acute intracranial abnormality identified. 2. Stable chronic microvascular ischemic changes and volume loss of the brain. 3. Stable opacification of left sphenoid and posterior ethmoid air cells with mild expansion, possibly underlying polyp or mucocele. Electronically Signed   By: Kristine Garbe M.D.   On: 03/17/2018 20:58   Dg Chest Port 1 View  Result Date: 03/17/2018 CLINICAL DATA:  Atelectasis EXAM: PORTABLE CHEST 1 VIEW COMPARISON:   02/12/2018 FINDINGS: Cardiac shadow remains enlarged. Dialysis catheter is again noted on the left and stable. Postsurgical changes are again seen. Minimal blunting of the costophrenic angles is noted bilaterally stable from the previous day. No pneumothorax is noted. No focal confluent infiltrate is seen. IMPRESSION: Small bilateral pleural effusions. Electronically Signed   By: Inez Catalina M.D.   On: 03/17/2018 07:41     Medications:   . ceFEPime (MAXIPIME) IV 1 g (03/17/18 2029)   . apixaban  2.5 mg Oral BID  . aspirin EC  81 mg Oral Daily  . atorvastatin  80 mg Oral QHS  . brimonidine  1 drop Both Eyes TID  . carvedilol  6.25 mg  Oral BID  . Chlorhexidine Gluconate Cloth  6 each Topical Q0600  . docusate sodium  100 mg Oral BID  . epoetin (EPOGEN/PROCRIT) injection  10,000 Units Intravenous Q T,Th,Sa-HD  . erythromycin   Left Eye Q6H  . famotidine  20 mg Oral Daily  . feeding supplement (NEPRO CARB STEADY)  237 mL Oral BID BM  . folic acid  1 mg Oral QPC supper  . gabapentin  100 mg Oral QHS  . levETIRAcetam  500 mg Oral Daily  . levETIRAcetam  500 mg Oral Once per day on Tue Thu Sat  . mirtazapine  7.5 mg Oral QHS  . multivitamin  1 tablet Oral QPC supper  . omega-3 acid ethyl esters  1 capsule Oral Daily  . polyethylene glycol  17 g Oral Daily  . polyvinyl alcohol  1 drop Both Eyes BID  . sevelamer carbonate  800 mg Oral TID WC  . thiamine  100 mg Oral QPC supper  . timolol  1 drop Both Eyes TID  . vitamin C  500 mg Oral BID   acetaminophen **OR** acetaminophen, hydrALAZINE, LORazepam **OR** LORazepam, morphine injection, ondansetron **OR** ondansetron (ZOFRAN) IV, oxyCODONE, sodium chloride  Assessment/ Plan:  Ms. Jinny Sweetland is a 67 y.o. black female with end stage renal disease on hemodialysis, hypertension, congestive heart failure, coronary artery disease status post CABG, diabetes mellitus type II insulin dependent, GERD, peripheral vascular disease status post right  BKA.  [facility] [place] TTS hemodialysis RIJ permcath  1. End Stage Renal Disease  TTS schedule Last hemodialysis done yesterday on Tuesday  2. 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 Lucky Cowboy.  Echocardiogram on November 24 showed LVEF 40 to 45%, mild concentric LVH, diffuse hypokinesis, grade 3 diastolic dysfunction  3. Anemia of chronic kidney disease:  Hemoglobin 8.9 - EPO with hemodialysis treatment TTS - Multivitamin daily  4. Hypertension: Now with Hypotension - required vasopressors with hemodialysis treatment this admission -  carvedilol for now.   5. Secondary Hyperparathyroidism: Sevelamer with meals  6. Syncope: concern for seizures. Loss of consciousness episodes.  Agitation noted last night Has been evaluated by neurologist this admission Currently managed with Keppra 500 3 times daily for 1 week   LOS: 10 Noemy Hallmon 11/27/201911:58 AM

## 2018-03-18 NOTE — ED Provider Notes (Signed)
[facility] Emergency Department Provider Note   ____________________________________________   I have reviewed the triage vital signs and the nursing notes.   HISTORY  Chief Complaint Loss of Consciousness   History limited by: Not Limited   HPI Stephanie Ellis is a 68 y.o. female who presents to the emergency department today from [facility] because of an episode of unresponsiveness. The patient was discharged from the hospital earlier today and per documentation had been evaluated for these episodes. Had been seen by neurology and was started on Keppra. Per documentation [facility] was aware of these episodes. The patient herself does not recall having an episode at [facility]. States that she feels like she is in her normal state of health.   Per medical record review patient has a history of discharge earlier today, hospitalization with work up of intermittent episodes of   Past Medical History:  Diagnosis Date  . (HFpEF) heart failure with preserved ejection fraction ([place])    a. 01/2018 Echo: EF 50-55%, no rwma, Gr1 DD, triv AI, mild MR. Midly dil LA/RV, mod reduced RV fxn. Irreg thickening of TV w/ mobile echodensity that appears to arise from valve.  . Anemia   . Anorexia   . Bacteremia due to Pseudomonas   . Carotid arterial disease ([place])    a. 01/2017 Carotid U/S: RICA <19, LICA <37.  Marland Kitchen Complication of anesthesia    a. Pt reports h/o complication on 9 different occasions - ? hypotension/arrest.  . 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 01/2015 Cath [facility]): 70% LM, p-mLAD 50-60% (Resting FFR 0.75), mRCA 80-90%, ~40 Ost OM & D1-->CABG; b. 01/2016 Staged PCI of LCX x 2 and RCA.  Marland Kitchen ESRD (end stage renal disease) on dialysis ([place])    a. ESRD secondary to acute kidney failure s/p CABG-->PD  . Essential hypertension   . GERD (gastroesophageal reflux disease)   . Heart murmur   . Hypercholesterolemia   .  Myocardial infarction ([place]) 2016  . PVD (peripheral vascular disease) ([place])    a. 01/30/2018 PV Angio: Sev Left Popliteal dzs s/p PTA w/ 13m lutonix DEB w/ mech aspiration of the L popliteal, L AT, and Left tibioperoneal trunck and peroneal artery.  . 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  [facility]: Dr. BMarland KitchenHaithcock: CABG x 3, LIMA to LAD, SVG to RCA, SVG to OM3, EVH  . Sinusitis 2019  . Type II diabetes mellitus with complication ([facility]    CAD    Patient Active Problem List   Diagnosis Date Noted  . Transient alteration of awareness   . VT (ventricular tachycardia) ([place]   . Disorientation   . Hypertensive urgency 03/08/2018  . Ischemia of extremity 02/18/2018  . ESRD (end stage renal disease) ([place]   . Diabetic osteomyelitis ([place]   . Peripheral vascular disease ([place]   . Advanced care planning/counseling discussion   . Palliative care by specialist   . Goals of care, counseling/discussion   . Diabetes mellitus, type 2 ([place] 01/30/2018  . Sepsis ([place] 01/29/2018  . Peripheral neuropathy 06/17/2017  . Encephalopathy 02/06/2017  . Hypertensive emergency 02/06/2017  . Endophthalmitis, left eye 12/05/2016  . Vomiting 10/24/2016  . Hematuria 10/17/2016  . Acute lower UTI 10/17/2016  . Anesthesia complication 090/24/0973 . Chest pain with moderate risk for cardiac etiology 09/04/2016  . Steal syndrome dialysis vascular access, initial encounter ([place] 06/18/2016  . 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 Coronary  artery disease involving coronary bypass graft of native heart with angina pectoris ([place]) 02/10/2016  . Hypertension 02/02/2016  . Hyperlipidemia 02/02/2016  . Coronary artery disease involving left main coronary artery 10/04/2015  . Abdominal pain of unknown etiology 10/04/2015  . ESRD (end stage renal disease) on dialysis ([place])   . Type II diabetes mellitus with complication ([place])   . NSTEMI (non-ST elevated myocardial infarction) ([place])   . Right sided  abdominal pain   . Elevated troponin 10/03/2015  . 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 Chronic diastolic congestive heart failure ([place]) 04/19/2014  . Stage 5 chronic kidney disease on chronic dialysis ([place]) 04/19/2014  . Anemia 09/20/2013  . Routine health maintenance 01/29/2013    Past Surgical History:  Procedure Laterality Date  . ABDOMINAL HYSTERECTOMY    . AMPUTATION Right 02/25/2018   Procedure: AMPUTATION BELOW KNEE;  Surgeon: Katha Cabal, MD;  Location: [facility] ORS;  Service: Vascular;  Laterality: Right;  . AMPUTATION Left 03/11/2018   Procedure: AMPUTATION BELOW KNEE;  Surgeon: Algernon Huxley, MD;  Location: [facility] ORS;  Service: Vascular;  Laterality: Left;  . AMPUTATION TOE Left 02/06/2018   Procedure: AMPUTATION GREAT TOE;  Surgeon: Samara Deist, DPM;  Location: [facility] ORS;  Service: Podiatry;  Laterality: Left;  . ARTERIOVENOUS GRAFT PLACEMENT  05/2016  . AV FISTULA PLACEMENT Left 05/30/2016   Procedure: ARTERIOVENOUS graft;  Surgeon: Algernon Huxley, MD;  Location: [facility] ORS;  Service: Vascular;  Laterality: Left;  . CAPD INSERTION N/A 10/30/2017   Procedure: LAPAROSCOPIC INSERTION CONTINUOUS AMBULATORY PERITONEAL DIALYSIS  (CAPD) CATHETER;  Surgeon: Algernon Huxley, MD;  Location: [facility] ORS;  Service: Vascular;  Laterality: N/A;  . CARDIAC CATHETERIZATION  01/2015   [facility]: Ost LM 70%, p-m LAD 50-60% (Rest FFR + @ 0.75), mRCA 80-90%, ostD1 40%, pOM1 40%  . CATARACT EXTRACTION W/PHACO Left 01/18/2016   Procedure: CATARACT EXTRACTION PHACO AND INTRAOCULAR LENS PLACEMENT (IOC);  Surgeon: Eulogio Bear, MD;  Location: [facility];  Service: Ophthalmology;  Laterality: Left;  [place] 1.05 AP% 15.5 CDE 10.16 Fluid Pack Lot # [id number]  . CATARACT EXTRACTION W/PHACO Right 08/01/2016   Procedure: CATARACT EXTRACTION PHACO AND INTRAOCULAR LENS PLACEMENT (IOC);  Surgeon: Eulogio Bear, MD;  Location: [facility];  Service: Ophthalmology;  Laterality: Right;  [place] 01:00.6 AP% 11.4 CDE 6.93  LOT # [id number]  .  COLONOSCOPY    . CORONARY ANGIOPLASTY     SENTS 02/12/16  . CORONARY ARTERY BYPASS GRAFT  03/28/15    [facility]: Dr. Waldemar Dickens: LIMA to LAD, SVG to RCA, SVG to OM3, EVH  . DIALYSIS/PERMA CATHETER INSERTION N/A 02/11/2018   Procedure: DIALYSIS/PERMA CATHETER INSERTION;  Surgeon: Algernon Huxley, MD;  Location: [place] CV LAB;  Service: Cardiovascular;  Laterality: N/A;  . DIALYSIS/PERMA CATHETER REMOVAL Right 02/04/2018   Procedure: DIALYSIS/PERMA CATHETER REMOVAL;  Surgeon: Katha Cabal, MD;  Location: [place] CV LAB;  Service: Cardiovascular;  Laterality: Right;  . ESOPHAGOGASTRODUODENOSCOPY (EGD) WITH PROPOFOL N/A 10/24/2016   Procedure: ESOPHAGOGASTRODUODENOSCOPY (EGD) WITH PROPOFOL;  Surgeon: [name], MD;  Location: [facility];  Service: Endoscopy;  Laterality: N/A;  . EYE SURGERY Bilateral    cataract surgery  . EYE SURGERY     drains for glaucoma  . INSERTION EXPRESS TUBE SHUNT Right 08/01/2016   Procedure: INSERTION EXPRESS TUBE SHUNT;  Surgeon: Eulogio Bear, MD;  Location: [facility];  Service: Ophthalmology;  Laterality: Right;  . INSERTION OF AHMED VALVE Left 08/15/2016   Procedure:  INSERTION OF AHMED VALVE;  Surgeon: Eulogio Bear, MD;  Location: [facility];  Service: Ophthalmology;  Laterality: Left;  . INSERTION OF DIALYSIS CATHETER    . LOWER EXTREMITY ANGIOGRAPHY Right 02/19/2018   Procedure: Lower Extremity Angiography;  Surgeon: Algernon Huxley, MD;  Location: [place] CV LAB;  Service: Cardiovascular;  Laterality: Right;  . LOWER EXTREMITY ANGIOGRAPHY Left 01/30/2018   Procedure: Lower Extremity Angiography;  Surgeon: Katha Cabal, MD;  Location: [place] CV LAB;  Service: Cardiovascular;  Laterality: Left;  . REMOVAL OF A DIALYSIS CATHETER N/A 02/25/2018   Procedure: REMOVAL OF A DIALYSIS CATHETER;  Surgeon: Katha Cabal, MD;  Location: [facility];  Service: Vascular;  Laterality: N/A;  . TEMPORARY DIALYSIS CATHETER N/A 02/06/2018    Procedure: TEMPORARY DIALYSIS CATHETER;  Surgeon: Katha Cabal, MD;  Location: [place] CV LAB;  Service: Cardiovascular;  Laterality: N/A;  . TRANSTHORACIC ECHOCARDIOGRAM  January 2017    EF 60-65%. GR 2 DD. Mild degenerative mitral valve disease but no prolapse or regurgitation. Mild left atrial dilation. Mild to moderate LVH. Pericardial effusion gone  . UPPER EXTREMITY ANGIOGRAPHY Left 06/27/2016   Procedure: Upper Extremity Angiography;  Surgeon: Algernon Huxley, MD;  Location: [place] CV LAB;  Service: Cardiovascular;  Laterality: Left;  . UPPER EXTREMITY ANGIOGRAPHY Left 08/22/2016   Procedure: Upper Extremity Angiography;  Surgeon: Algernon Huxley, MD;  Location: [place] CV LAB;  Service: Cardiovascular;  Laterality: Left;  Marland Kitchen VASCULAR SURGERY      Prior to Admission medications   Medication Sig Start Date End Date Taking? Authorizing Provider  apixaban (ELIQUIS) 2.5 MG TABS tablet Take 1 tablet (2.5 mg total) by mouth 2 (two) times daily. 02/15/18   Vaughan Basta, MD  aspirin EC 81 MG tablet Take 1 tablet (81 mg total) by mouth daily. 10/19/16   Dustin Flock, MD  atorvastatin (LIPITOR) 80 MG tablet Take 80 mg by mouth at bedtime.  05/05/17   [provider]  brimonidine (ALPHAGAN) 0.2 % ophthalmic solution Place 1 drop into both eyes 3 (three) times daily. 03/18/18   Bettey Costa, MD  calcitRIOL (ROCALTROL) 0.5 MCG capsule Take 0.5 mcg by mouth daily.    [provider]  carvedilol (COREG) 6.25 MG tablet Take 1 tablet (6.25 mg total) by mouth 2 (two) times daily. 03/18/18   Bettey Costa, MD  docusate sodium (COLACE) 100 MG capsule Take 1 capsule (100 mg total) by mouth 2 (two) times daily. 02/15/18   Vaughan Basta, MD  folic acid (FOLVITE) 1 MG tablet Take 1 tablet (1 mg total) by mouth daily. 02/15/18 02/15/19  Vaughan Basta, MD  isosorbide mononitrate (IMDUR) 30 MG 24 hr tablet Take 30 mg by mouth daily.     [provider]   levETIRAcetam (KEPPRA) 500 MG tablet Take 1 tablet (500 mg total) by mouth 3 (three) times a week for 3 days. 03/19/18 03/22/18  Bettey Costa, MD  midodrine (PROAMATINE) 10 MG tablet Take 1 tablet (10 mg total) by mouth 3 (three) times a week. Patient taking differently: Take 10 mg by mouth 3 (three) times a week. Take Tuesday, Thursday, and Saturday 02/17/18   Vaughan Basta, MD  mirtazapine (REMERON) 7.5 MG tablet Take 7.5 mg by mouth at bedtime.    [provider]  Multiple Vitamin (MULTIVITAMIN WITH MINERALS) TABS tablet Take 2 tablets by mouth daily. Gummy vitamins    [provider]  Multiple Vitamins-Minerals (PRORENAL + D) TABS Take 1 tablet  by mouth daily.    [provider]  nitroGLYCERIN (NITROSTAT) 0.4 MG SL tablet Place 1 tablet (0.4 mg total) under the tongue every 5 (five) minutes as needed for chest pain. 09/05/16   Loletha Grayer, MD  nizatidine (AXID) 150 MG capsule Take 1 capsule (150 mg total) by mouth 2 (two) times daily. 02/15/18   Vaughan Basta, MD  Nutritional Supplements (FEEDING SUPPLEMENT, NEPRO CARB STEADY,) LIQD Take 237 mLs by mouth 2 (two) times daily between meals. 02/15/18   Vaughan Basta, MD  Omega-3 300 MG CAPS Take 2 capsules by mouth daily.    [provider]  ondansetron (ZOFRAN) 4 MG tablet Take 4 mg by mouth every 8 (eight) hours as needed for nausea or vomiting.    [provider]  oxyCODONE (OXY IR/ROXICODONE) 5 MG immediate release tablet Take 1 tablet (5 mg total) by mouth every 6 (six) hours as needed for moderate pain or severe pain. 03/18/18   Bettey Costa, MD  Polyethyl Glycol-Propyl Glycol 0.4-0.3 % SOLN Place 1 drop into both eyes 2 (two) times daily.    [provider]  sevelamer carbonate (RENVELA) 800 MG tablet Take 800 mg by mouth 3 (three) times daily with meals.    [provider]  timolol (TIMOPTIC) 0.5 % ophthalmic solution Place 1 drop into both eyes 3  (three) times daily. 03/18/18   Bettey Costa, MD  vitamin B-12 (CYANOCOBALAMIN) 1000 MCG tablet Take 1 tablet (1,000 mcg total) by mouth daily. Patient taking differently: Take 500 mcg by mouth daily.  02/15/18   Vaughan Basta, MD    Allergies Chlorthalidone; Fentanyl; Midazolam; Ace inhibitors; Angiotensin receptor blockers; Norvasc [amlodipine]; and Phenergan [promethazine hcl]  Family History  Problem Relation Age of Onset  . Diabetes Mellitus II Mother   . Heart failure Mother   . Pancreatic cancer Father     Social History Social History   Tobacco Use  . Smoking status: Never Smoker  . Smokeless tobacco: Never Used  Substance Use Topics  . Alcohol use: No  . Drug use: No    Review of Systems Constitutional: No fever/chills Eyes: No visual changes. ENT: No sore throat. Cardiovascular: Denies chest pain. Respiratory: Denies shortness of breath. Gastrointestinal: No abdominal pain.  No nausea, no vomiting.  No diarrhea.   Genitourinary: Negative for dysuria. Musculoskeletal: Negative for back pain. Skin: Negative for rash. Neurological: Negative for headaches, focal weakness or numbness.  ____________________________________________   PHYSICAL EXAM:  VITAL SIGNS: ED Triage Vitals  Enc Vitals Group     BP      Pulse      Resp      Temp      Temp src      SpO2      Weight      Height      Head Circumference      Peak Flow      Pain Score      Pain Loc      Pain Edu?      Excl. in [place]?      Constitutional: Alert and oriented.  Eyes: Conjunctivae are normal.  ENT      Head: Normocephalic and atraumatic.      Nose: No congestion/rhinnorhea.      Mouth/Throat: Mucous membranes are moist.      Neck: No stridor. Hematological/Lymphatic/Immunilogical: No cervical lymphadenopathy. Cardiovascular: Normal rate, regular rhythm.  No murmurs, rubs, or gallops.  Respiratory: Normal respiratory effort without tachypnea nor retractions. Breath sounds  are  clear and equal bilaterally. No wheezes/rales/rhonchi. Gastrointestinal: Soft and non tender. No rebound. No guarding.  Genitourinary: Deferred Musculoskeletal: Bilateral lower extremity amputation, incision sites c/d/i Neurologic:  Normal speech and language. No gross focal neurologic deficits are appreciated.  Skin:  Skin is warm, dry and intact. No rash noted. Psychiatric: Mood and affect are normal. Speech and behavior are normal. Patient exhibits appropriate insight and judgment.  ____________________________________________    LABS (pertinent positives/negatives)  CBC wbc 10.5, hgb 8.3, plt 345 BMP na 140, k 4.2, glu 177, cr 6.38  ____________________________________________   EKG  None  ____________________________________________    RADIOLOGY  None  ____________________________________________   PROCEDURES  Procedures  ____________________________________________   INITIAL IMPRESSION / ASSESSMENT AND PLAN / ED COURSE  Pertinent labs & imaging results that were available during my care of the patient were reviewed by me and considered in my medical decision making (see chart for details).   Patient sent to the emergency department from [facility] because of an episode of unresponsiveness.  Patient did have these episodes during her recent admission and had work-up for that.  Per documentation by social work [facility] was made aware of these episodes.  However when the episode happened today she was sent back.  I did call a nurse at [facility] to discuss what happened.  I am unclear what the goal of bringing her back to the emergency department was.  Nurse stated that she would have to talk to her doctor and call me back.  When she called back she was directed to went to the nurses.  She stated that the physician at the facility was refusing to have the patient return to the facility at this time.  Will plan on boarding the patient here in the  emergency department for social work to evaluate.  I did discuss with nephrology to help coordinate dialysis.  Discussed plan with patient and patient's family.   ____________________________________________   FINAL CLINICAL IMPRESSION(S) / ED DIAGNOSES  Final diagnoses:  Living accommodation issues  Decreased responsiveness     Note: This dictation was prepared with Dragon dictation. Any transcriptional errors that result from this process are unintentional      Nance Pear, MD 03/18/18 2321

## 2018-03-18 NOTE — Progress Notes (Signed)
Family was here before the patient was discharged and was upset that they did not know the patient was being discharged.  Apparently the daughter who is POA was not notified but another daughter of the patient was notified instead.  Social worker, Skip Estimable did talk with the patient and dr mody.  Dr Benjie Karvonen talked with the family and gave them updates on the patient status.  Patient sent out via EMS

## 2018-03-18 NOTE — Progress Notes (Signed)
[facility] [place] NAME: Stephanie Ellis    MR#:  [id number]  DATE OF BIRTH:  1950-11-21  SUBJECTIVE:   planned for discharge today REVIEW OF SYSTEMS:    Review of Systems  Constitutional: Negative for fever, chills weight loss HENT: Negative for ear pain, nosebleeds, congestion, facial swelling, rhinorrhea, neck pain, neck stiffness and ear discharge.   Respiratory: Negative for cough, shortness of breath, wheezing  Cardiovascular: Negative for chest pain, palpitations and leg swelling.  Gastrointestinal: Negative for heartburn, abdominal pain, vomiting, diarrhea or consitpation Genitourinary: Negative for dysuria, urgency, frequency, hematuria Musculoskeletal: Negative for back pain or joint pain Neurological: Negative for dizziness, seizures, syncope, focal weakness,  numbness and headaches.  Hematological: Does not bruise/bleed easily.  Psychiatric/Behavioral: Negative for hallucinations, confusion, dysphoric mood    Tolerating Diet: yes      DRUG ALLERGIES:   Allergies  Allergen Reactions  . Chlorthalidone Anaphylaxis, Itching and Rash  . Fentanyl Rash  . Midazolam Rash  . Ace Inhibitors Other (See Comments)    Reaction:  Hyperkalemia, agitation   . Angiotensin Receptor Blockers Other (See Comments)    Reaction:  Hyperkalemia, agitation   . Norvasc [Amlodipine] Itching and Rash  . Phenergan [Promethazine Hcl] Anxiety    "antsy, can't sit still"    VITALS:  Blood pressure (!) 76/40, pulse 69, temperature 98.3 F (36.8 C), temperature source Oral, resp. rate 14, height 5' 4"  (1.626 m), weight 56 kg, SpO2 90 %.  PHYSICAL EXAMINATION:  Constitutional: Appears well-developed and well-nourished. No distress. HENT: Normocephalic. Marland Kitchen Oropharynx is clear and moist.  Eyes: Conjunctivae and EOM are normal. PERRLA, no scleral icterus.  Neck: Normal ROM. Neck supple. No JVD. No tracheal deviation. CVS: RRR, S1/S2 +, no murmurs,  no gallops, no carotid bruit.  Pulmonary: Effort and breath sounds normal, no stridor, rhonchi, wheezes, rales.  Abdominal: Soft. BS +,  no distension, tenderness, rebound or guarding.  Musculoskeletal: b/l BKA No edema and no tenderness.  Neuro: Alert. CN 2-12 grossly intact. No focal deficits. Skin: Skin is warm and dry. No rash noted. Psychiatric: Normal mood and affect.      LABORATORY PANEL:   CBC Recent Labs  Lab 03/17/18 0553  WBC 11.5*  HGB 8.9*  HCT 30.7*  PLT 390   ------------------------------------------------------------------------------------------------------------------  Chemistries  Recent Labs  Lab 03/15/18 1236  NA 136  K 4.3  CL 97*  CO2 28  GLUCOSE 85  BUN 22  CREATININE 4.85*  CALCIUM 8.2*  MG 2.0   ------------------------------------------------------------------------------------------------------------------  Cardiac Enzymes Recent Labs  Lab 03/14/18 2302  TROPONINI 0.21*   ------------------------------------------------------------------------------------------------------------------  RADIOLOGY:  Ct Head Wo Contrast  Result Date: 03/17/2018 CLINICAL DATA:  67 y/o  F; confusion and agitation. EXAM: CT HEAD WITHOUT CONTRAST TECHNIQUE: Contiguous axial images were obtained from the base of the skull through the vertex without intravenous contrast. COMPARISON:  03/14/2018 MRI head. 03/13/2018 CT head. FINDINGS: Brain: No evidence of acute infarction, hemorrhage, hydrocephalus, extra-axial collection or mass lesion/mass effect. Stable chronic microvascular ischemic changes and volume loss of the brain. 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 Vascular: Calcific atherosclerosis of the carotid siphons and vertebral arteries. No hyperdense vessel identified. Skull: Normal. Negative for fracture or focal lesion. Sinuses/Orbits: Stable opacification of the left sphenoid sinus extending into left posterior ethmoid  air cells, possibly underlying polyp or mucocele. Additional visible paranasal sinuses and the mastoid air cells are normally aerated. Bilateral intra-ocular lens replacement.  Other: None. IMPRESSION: 1. No acute intracranial abnormality identified. 2. Stable chronic microvascular ischemic changes and volume loss of the brain. 3. Stable opacification of left sphenoid and posterior ethmoid air cells with mild expansion, possibly underlying polyp or mucocele. Electronically Signed   By: Kristine Garbe M.D.   On: 03/17/2018 20:58   Dg Chest Port 1 View  Result Date: 03/17/2018 CLINICAL DATA:  Atelectasis EXAM: PORTABLE CHEST 1 VIEW COMPARISON:  02/12/2018 FINDINGS: Cardiac shadow remains enlarged. Dialysis catheter is again noted on the left and stable. Postsurgical changes are again seen. Minimal blunting of the costophrenic angles is noted bilaterally stable from the previous day. No pneumothorax is noted. No focal confluent infiltrate is seen. IMPRESSION: Small bilateral pleural effusions. Electronically Signed   By: Inez Catalina M.D.   On: 03/17/2018 07:41     ASSESSMENT AND PLAN:   67 year old female with a history of diabetes, end-stage renal disease on hemodialysis and tricuspid endocarditis with MSSA who presented to the emergency room due to shortness of breath.  1.  Acute hypoxic respiratory failure due to pulmonary edema requiring dialysis  2.  Hypertensive urgency: Patient was treated and her blood pressure is improved  3.  Intermittent episodes of unresponsiveness: Patient had several episodes of unresponsiveness with unclear etiology.  She underwent head CT x2.  She was evaluated neurology.  He she had an EEG which did not show evidence of seizure.  Neurology however did recommend 7 days of Keppra.  This is renally dosed.  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 She had prior laser capsulotomy. Eyedrops recommended at this  time. She will need pars plano vitrectomy-recommended outpatient follow-up after discharge. 5.  End-stage renal disease: Patient will continue on regular dialysis schedule.  6.  History of tricuspid endocarditis/MSSA: Patient is completed antibiotic therapy.  7.  Diabetes: Patient's blood sugars are low and therefore medications were discontinued for now.  8.  Postoperative day #7 status post left BKA: Patient will follow-up with vascular surgery for wound check/staple remover.  Continue daily dressing changes.      Management plans discussed with the patient and she is in agreement.  CODE STATUS: dnr  TOTAL TIME TAKING CARE OF THIS PATIENT: 38 minutes.     POSSIBLE D/C today, DEPENDING ON CLINICAL CONDITION.   Klaira Pesci M.D on 03/18/2018 at 12:28 PM  Between 7am to 6pm - Pager - [phone] After 6pm go to [email or web address] - password [email or web address] [facility]  Office  [phone]  CC: Primary care physician; No primary care provider on file.  Note: This dictation was prepared with Dragon dictation along with smaller phrase technology. Any transcriptional errors that result from this process are unintentional.

## 2018-03-18 NOTE — Progress Notes (Signed)
CNA notified me to let me know BP was low.  Patient is clammy and lethargic.  BP was 80/30 manual then 125/36 manual.  Heart rate in the 80's.  Patient had removed her oxygen and was 83%.  Oxygen went up to 95% after her oxygen was put back on.  CBG was 193.  Dr Benjie Karvonen notified.  Discharge to continue as planned

## 2018-03-18 NOTE — ED Triage Notes (Signed)
Per EMS, pt discharged from [facility] today.  Per [employer], pt could not be awakened but had pulses.  Vital signs stable.  CBG  190.

## 2018-03-18 NOTE — Care Management Important Message (Signed)
Copy of signed IM left with patient in room.  

## 2018-03-18 NOTE — Clinical Social Work Note (Signed)
Patient is medically ready for discharge today. Patient will return to [facility]. CSW notified patient of discharge today CSW also notified Magda Paganini at [facility] of discharge today. Patient will be transported by EMS. RN to call report and call for transport.   [place], [place]

## 2018-03-18 NOTE — Progress Notes (Signed)
Patient's daughter Stephanie Ellis and patient's sister are at bedside and have concerns about discharge today. Clinical Education officer, museum (CSW) met with patient and her daughter Stephanie Ellis and sister were at bedside. Patient's other daughter/ HPOA [name] was on speaker phone. Family requested to speak with MD about CT scan. CSW put MD on speaker phone and she answered the family's questions. Family had concerns about [place] monitoring her for the "unresponsive episodes." CSW made [facility] at [facility] aware of above and Occupational psychologist of nursing aware of above. Patient is medically stable for D/C back to [facility] today and family is in agreement. Please reconsult if future social work needs arise. CSW signing off.   [employer], LCSW [phone]

## 2018-03-19 ENCOUNTER — Emergency Department: Payer: Medicare Other

## 2018-03-19 DIAGNOSIS — Z992 Dependence on renal dialysis: Secondary | ICD-10-CM | POA: Diagnosis not present

## 2018-03-19 DIAGNOSIS — Z7982 Long term (current) use of aspirin: Secondary | ICD-10-CM | POA: Diagnosis not present

## 2018-03-19 DIAGNOSIS — Z7901 Long term (current) use of anticoagulants: Secondary | ICD-10-CM | POA: Diagnosis not present

## 2018-03-19 DIAGNOSIS — N2581 Secondary hyperparathyroidism of renal origin: Secondary | ICD-10-CM | POA: Diagnosis present

## 2018-03-19 DIAGNOSIS — R55 Syncope and collapse: Secondary | ICD-10-CM | POA: Diagnosis present

## 2018-03-19 DIAGNOSIS — Z89511 Acquired absence of right leg below knee: Secondary | ICD-10-CM | POA: Diagnosis not present

## 2018-03-19 DIAGNOSIS — Z833 Family history of diabetes mellitus: Secondary | ICD-10-CM | POA: Diagnosis not present

## 2018-03-19 DIAGNOSIS — Z9841 Cataract extraction status, right eye: Secondary | ICD-10-CM | POA: Diagnosis not present

## 2018-03-19 DIAGNOSIS — K219 Gastro-esophageal reflux disease without esophagitis: Secondary | ICD-10-CM | POA: Diagnosis present

## 2018-03-19 DIAGNOSIS — Z90710 Acquired absence of both cervix and uterus: Secondary | ICD-10-CM | POA: Diagnosis not present

## 2018-03-19 DIAGNOSIS — I2510 Atherosclerotic heart disease of native coronary artery without angina pectoris: Secondary | ICD-10-CM | POA: Diagnosis present

## 2018-03-19 DIAGNOSIS — E1122 Type 2 diabetes mellitus with diabetic chronic kidney disease: Secondary | ICD-10-CM | POA: Diagnosis present

## 2018-03-19 DIAGNOSIS — I5032 Chronic diastolic (congestive) heart failure: Secondary | ICD-10-CM | POA: Diagnosis present

## 2018-03-19 DIAGNOSIS — R4189 Other symptoms and signs involving cognitive functions and awareness: Secondary | ICD-10-CM | POA: Diagnosis not present

## 2018-03-19 DIAGNOSIS — Z800 Family history of malignant neoplasm of digestive organs: Secondary | ICD-10-CM | POA: Diagnosis not present

## 2018-03-19 DIAGNOSIS — I132 Hypertensive heart and chronic kidney disease with heart failure and with stage 5 chronic kidney disease, or end stage renal disease: Secondary | ICD-10-CM | POA: Diagnosis present

## 2018-03-19 DIAGNOSIS — Z9842 Cataract extraction status, left eye: Secondary | ICD-10-CM | POA: Diagnosis not present

## 2018-03-19 DIAGNOSIS — I252 Old myocardial infarction: Secondary | ICD-10-CM | POA: Diagnosis not present

## 2018-03-19 DIAGNOSIS — Z961 Presence of intraocular lens: Secondary | ICD-10-CM | POA: Diagnosis present

## 2018-03-19 DIAGNOSIS — R569 Unspecified convulsions: Secondary | ICD-10-CM | POA: Diagnosis present

## 2018-03-19 DIAGNOSIS — H409 Unspecified glaucoma: Secondary | ICD-10-CM | POA: Diagnosis present

## 2018-03-19 DIAGNOSIS — Z89512 Acquired absence of left leg below knee: Secondary | ICD-10-CM | POA: Diagnosis not present

## 2018-03-19 DIAGNOSIS — E7800 Pure hypercholesterolemia, unspecified: Secondary | ICD-10-CM | POA: Diagnosis present

## 2018-03-19 DIAGNOSIS — Z951 Presence of aortocoronary bypass graft: Secondary | ICD-10-CM | POA: Diagnosis not present

## 2018-03-19 DIAGNOSIS — N186 End stage renal disease: Secondary | ICD-10-CM | POA: Diagnosis present

## 2018-03-19 DIAGNOSIS — Z8249 Family history of ischemic heart disease and other diseases of the circulatory system: Secondary | ICD-10-CM | POA: Diagnosis not present

## 2018-03-19 LAB — CULTURE, BLOOD (ROUTINE X 2)
Culture: NO GROWTH
Culture: NO GROWTH

## 2018-03-19 MED ORDER — ACETAMINOPHEN 650 MG RE SUPP: 650.0000 mg | Freq: Four times a day (QID) | RECTAL | Status: DC | PRN

## 2018-03-19 MED ORDER — HEPARIN SODIUM (PORCINE) 5000 UNIT/ML IJ SOLN: 5000.0000 [IU] | Freq: Three times a day (TID) | INTRAMUSCULAR | Status: DC

## 2018-03-19 MED ORDER — LEVETIRACETAM 500 MG PO TABS
500.0000 mg | ORAL_TABLET | Freq: Two times a day (BID) | ORAL | Status: DC
  Administered 2018-03-19 – 2018-03-23 (×8): 500 mg via ORAL
  Filled 2018-03-19 (×7): qty 1

## 2018-03-19 MED ORDER — CHLORHEXIDINE GLUCONATE CLOTH 2 % EX PADS
6.0000 | MEDICATED_PAD | Freq: Every day | CUTANEOUS | Status: DC
  Administered 2018-03-20 – 2018-03-23 (×4): 6 via TOPICAL

## 2018-03-19 MED ORDER — LEVETIRACETAM 500 MG PO TABS
500.0000 mg | ORAL_TABLET | Freq: Once | ORAL | Status: AC
  Administered 2018-03-21: 500 mg via ORAL
  Filled 2018-03-19: qty 1

## 2018-03-19 MED ORDER — TRAMADOL HCL 50 MG PO TABS
50.0000 mg | ORAL_TABLET | Freq: Three times a day (TID) | ORAL | Status: DC | PRN
  Administered 2018-03-20 – 2018-03-23 (×6): 50 mg via ORAL
  Filled 2018-03-19 (×7): qty 1

## 2018-03-19 MED ORDER — ACETAMINOPHEN 325 MG PO TABS
650.0000 mg | ORAL_TABLET | Freq: Four times a day (QID) | ORAL | Status: DC | PRN
  Administered 2018-03-20: 650 mg via ORAL

## 2018-03-19 MED ORDER — ONDANSETRON HCL 4 MG/2ML IJ SOLN: 4.0000 mg | Freq: Four times a day (QID) | INTRAMUSCULAR | Status: DC | PRN

## 2018-03-19 MED ORDER — DOCUSATE SODIUM 100 MG PO CAPS: 100.0000 mg | ORAL_CAPSULE | Freq: Two times a day (BID) | ORAL | Status: DC

## 2018-03-19 MED ORDER — LEVETIRACETAM 250 MG PO TABS
250.0000 mg | ORAL_TABLET | ORAL | Status: DC
  Administered 2018-03-20: 250 mg via ORAL
  Filled 2018-03-19 (×2): qty 1

## 2018-03-19 MED ORDER — ONDANSETRON HCL 4 MG PO TABS: 4.0000 mg | ORAL_TABLET | Freq: Four times a day (QID) | ORAL | Status: DC | PRN

## 2018-03-19 MED ORDER — POLYVINYL ALCOHOL 1.4 % OP SOLN
1.0000 [drp] | Freq: Two times a day (BID) | OPHTHALMIC | Status: DC
  Administered 2018-03-19 – 2018-03-23 (×10): 1 [drp] via OPHTHALMIC
  Filled 2018-03-19: qty 15

## 2018-03-19 NOTE — ED Notes (Signed)
X-ray at bedside

## 2018-03-19 NOTE — Progress Notes (Signed)
Family Meeting Note  Advance Directive:yes  Today a meeting took place with the pt and dter  In ER  Patient being admitted with recurrent transient loss of consciousness. She she was just discharge yesterday after amputation. Patient is end-stage renal disease, CAD, CHF, severe peripheral vascular disease and these recurrent episodes of loss of consciousness. She has long-term poor prognosis. Patient carries a DNR from. This was confirmed with daughter and patient remains a DNR.  Time spent during discussion: 16 mins Fritzi Mandes, MD

## 2018-03-19 NOTE — H&P (Signed)
[place] [facility] [place] NAME: Stephanie Ellis    MR#:  [id number]  DATE OF BIRTH:  Jun 12, 1950  DATE OF ADMISSION:  03/18/2018  PRIMARY CARE PHYSICIAN: Patient, No Pcp Per   REQUESTING/REFERRING PHYSICIAN: Dr. Quentin Cornwall  CHIEF COMPLAINT:  transient loss of consciousness--- recurrent  HISTORY OF PRESENT ILLNESS:  Stephanie Ellis  is a 67 y.o. female with a known history of severe peripheral vascular disease status post bilateral recent below knee amputation, chronic anemia, end-stage renal disease on hemodialysis, CAD comes to the emergency room from [facility] after she had an episode of transient loss of consciousness.  Pt has been having these episodes on and off where she passes out and returns back in a couple minutes. She does not remember or does not have any warnings/signs symptoms prior to this episode.  Patient has had two CT scans that were negative MRI negative other than the old infarct. EEG was negative. Neurology evaluation during last admission patient was started empirically on Keppra. No seizures noted at by the nursing home staff.  At present is hemodynamically stable. Daughter in the room. Daughter stays there frustrated with patient being brought back and forth for these episodes for which we have not found the etiology.  Daughter also mentions [facility] is not going to accept patient back. Social worker consultation placed  Patient is being admitted for further evaluation of management and she is awake alert oriented during my evaluation. Blood pressure and  heart rate stable.  PAST MEDICAL HISTORY:   Past Medical History:  Diagnosis Date  . (HFpEF) heart failure with preserved ejection fraction ([place])    a. 01/2018 Echo: EF 50-55%, no rwma, Gr1 DD, triv AI, mild MR. Midly dil LA/RV, mod reduced RV fxn. Irreg thickening of TV w/ mobile echodensity that appears to arise from valve.  . Anemia   . Anorexia   .  Bacteremia due to Pseudomonas   . Carotid arterial disease ([place])    a. 01/2017 Carotid U/S: RICA <18, LICA <84.  Marland Kitchen Complication of anesthesia    a. Pt reports h/o complication on 9 different occasions - ? hypotension/arrest.  . 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 01/2015 Cath [facility]): 70% LM, p-mLAD 50-60% (Resting FFR 0.75), mRCA 80-90%, ~40 Ost OM & D1-->CABG; b. 01/2016 Staged PCI of LCX x 2 and RCA.  Marland Kitchen ESRD (end stage renal disease) on dialysis ([place])    a. ESRD secondary to acute kidney failure s/p CABG-->PD  . Essential hypertension   . GERD (gastroesophageal reflux disease)   . Heart murmur   . Hypercholesterolemia   . Myocardial infarction ([place]) 2016  . PVD (peripheral vascular disease) ([place])    a. 01/30/2018 PV Angio: Sev Left Popliteal dzs s/p PTA w/ 74m lutonix DEB w/ mech aspiration of the L popliteal, L AT, and Left tibioperoneal trunck and peroneal artery.  . 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  [facility]: Dr. BMarland KitchenHaithcock: CABG x 3, LIMA to LAD, SVG to RCA, SVG to OM3, EVH  . Sinusitis 2019  . Type II diabetes mellitus with complication (HCC)    CAD    PAST SURGICAL HISTOIRY:   Past Surgical History:  Procedure Laterality Date  . ABDOMINAL HYSTERECTOMY    . AMPUTATION Right 02/25/2018   Procedure: AMPUTATION BELOW KNEE;  Surgeon: SKatha Cabal MD;  Location: [facility] ORS;  Service: Vascular;  Laterality: Right;  . AMPUTATION  Left 03/11/2018   Procedure: AMPUTATION BELOW KNEE;  Surgeon: Algernon Huxley, MD;  Location: [facility] OR[facility];  Service: Vascular;  Laterality: Left;  . AMPUTATION TOE Left 02/06/2018   Procedure: AMPUTATION GREAT TOE;  Surgeon: Samara Deist, DPM;  Location: [facility] OR[facility];  Service: Podiatry;  Laterality: Left;  . ARTERIOVENOUS GRAFT PLACEMENT  05/2016  . AV FISTULA PLACEMENT Left 05/30/2016   Procedure: ARTERIOVENOUS graft;  Surgeon: Algernon Huxley, MD;  Location: [facility] OR[facility];  Service: Vascular;  Laterality: Left;  . CAPD INSERTION  N/A 10/30/2017   Procedure: LAPAROSCOPIC INSERTION CONTINUOUS AMBULATORY PERITONEAL DIALYSIS  (CAPD) CATHETER;  Surgeon: Algernon Huxley, MD;  Location: [facility];  Service: Vascular;  Laterality: N/A;  . CARDIAC CATHETERIZATION  01/2015   [facility]: Ost LM 70%, p-m LAD 50-60% (Rest FFR + @ 0.75), mRCA 80-90%, ostD1 40%, pOM1 40%  . CATARACT EXTRACTION W/PHACO Left 01/18/2016   Procedure: CATARACT EXTRACTION PHACO AND INTRAOCULAR LENS PLACEMENT (IOC);  Surgeon: Eulogio Bear, MD;  Location: [facility];  Service: Ophthalmology;  Laterality: Left;  [place] 1.05 AP% 15.5 CDE 10.16 Fluid Pack Lot # [id number]  . CATARACT EXTRACTION W/PHACO Right 08/01/2016   Procedure: CATARACT EXTRACTION PHACO AND INTRAOCULAR LENS PLACEMENT (IOC);  Surgeon: Eulogio Bear, MD;  Location: [facility];  Service: Ophthalmology;  Laterality: Right;  [place] 01:00.6 AP% 11.4 CDE 6.93  LOT # [id number]  . COLONOSCOPY    . CORONARY ANGIOPLASTY     SENTS 02/12/16  . CORONARY ARTERY BYPASS GRAFT  03/28/15    [facility]: Dr. Waldemar Dickens: LIMA to LAD, SVG to RCA, SVG to OM3, EVH  . DIALYSIS/PERMA CATHETER INSERTION N/A 02/11/2018   Procedure: DIALYSIS/PERMA CATHETER INSERTION;  Surgeon: Algernon Huxley, MD;  Location: [place] [facility];  Service: Cardiovascular;  Laterality: N/A;  . DIALYSIS/PERMA CATHETER REMOVAL Right 02/04/2018   Procedure: DIALYSIS/PERMA CATHETER REMOVAL;  Surgeon: Katha Cabal, MD;  Location: [place] [facility];  Service: Cardiovascular;  Laterality: Right;  . ESOPHAGOGASTRODUODENOSCOPY (EGD) WITH PROPOFOL N/A 10/24/2016   Procedure: ESOPHAGOGASTRODUODENOSCOPY (EGD) WITH PROPOFOL;  Surgeon: [name], MD;  Location: [facility];  Service: Endoscopy;  Laterality: N/A;  . EYE SURGERY Bilateral    cataract surgery  . EYE SURGERY     drains for glaucoma  . INSERTION EXPRESS TUBE SHUNT Right 08/01/2016   Procedure: INSERTION EXPRESS TUBE SHUNT;  Surgeon: Eulogio Bear, MD;  Location: [facility];  Service:  Ophthalmology;  Laterality: Right;  . INSERTION OF AHMED VALVE Left 08/15/2016   Procedure: INSERTION OF AHMED VALVE;  Surgeon: Eulogio Bear, MD;  Location: [facility];  Service: Ophthalmology;  Laterality: Left;  . INSERTION OF DIALYSIS CATHETER    . LOWER EXTREMITY ANGIOGRAPHY Right 02/19/2018   Procedure: Lower Extremity Angiography;  Surgeon: Algernon Huxley, MD;  Location: [place] [facility];  Service: Cardiovascular;  Laterality: Right;  . LOWER EXTREMITY ANGIOGRAPHY Left 01/30/2018   Procedure: Lower Extremity Angiography;  Surgeon: Katha Cabal, MD;  Location: [place] [facility];  Service: Cardiovascular;  Laterality: Left;  . REMOVAL OF A DIALYSIS CATHETER N/A 02/25/2018   Procedure: REMOVAL OF A DIALYSIS CATHETER;  Surgeon: Katha Cabal, MD;  Location: [facility];  Service: Vascular;  Laterality: N/A;  . TEMPORARY DIALYSIS CATHETER N/A 02/06/2018   Procedure: TEMPORARY DIALYSIS CATHETER;  Surgeon: Katha Cabal, MD;  Location: [place] [facility];  Service: Cardiovascular;  Laterality: N/A;  . TRANSTHORACIC ECHOCARDIOGRAM  January 2017    EF 60-65%. GR  2 DD. Mild degenerative mitral valve disease but no prolapse or regurgitation. Mild left atrial dilation. Mild to moderate LVH. Pericardial effusion gone  . UPPER EXTREMITY ANGIOGRAPHY Left 06/27/2016   Procedure: Upper Extremity Angiography;  Surgeon: Algernon Huxley, MD;  Location: [place] [facility];  Service: Cardiovascular;  Laterality: Left;  . UPPER EXTREMITY ANGIOGRAPHY Left 08/22/2016   Procedure: Upper Extremity Angiography;  Surgeon: Algernon Huxley, MD;  Location: [place] [facility];  Service: Cardiovascular;  Laterality: Left;  Marland Kitchen VASCULAR SURGERY      SOCIAL HISTORY:   Social History   Tobacco Use  . Smoking status: Never Smoker  . Smokeless tobacco: Never Used  Substance Use Topics  . Alcohol use: No    FAMILY HISTORY:   Family History  Problem Relation Age of Onset  . Diabetes Mellitus II Mother    . Heart failure Mother   . Pancreatic cancer Father     DRUG ALLERGIES:   Allergies  Allergen Reactions  . Chlorthalidone Anaphylaxis, Itching and Rash  . Fentanyl Rash  . Midazolam Rash  . Ace Inhibitors Other (See Comments)    Reaction:  Hyperkalemia, agitation   . Angiotensin Receptor Blockers Other (See Comments)    Reaction:  Hyperkalemia, agitation   . Norvasc [Amlodipine] Itching and Rash  . Phenergan [Promethazine Hcl] Anxiety    "antsy, can't sit still"    REVIEW OF SYSTEMS:  Review of Systems  Constitutional: Negative for chills, fever and weight loss.  HENT: Negative for ear discharge, ear pain and nosebleeds.   Eyes: Negative for blurred vision, pain and discharge.  Respiratory: Negative for sputum production, shortness of breath, wheezing and stridor.   Cardiovascular: Negative for chest pain, palpitations, orthopnea and PND.  Gastrointestinal: Negative for abdominal pain, diarrhea, nausea and vomiting.  Genitourinary: Negative for frequency and urgency.  Musculoskeletal: Positive for joint pain. Negative for back pain.  Neurological: Positive for weakness. Negative for sensory change, speech change and focal weakness.  Psychiatric/Behavioral: Negative for depression and hallucinations. The patient is not nervous/anxious.      MEDICATIONS AT HOME:   Prior to Admission medications   Medication Sig Start Date End Date Taking? Authorizing Provider  apixaban (ELIQUIS) 2.5 MG TABS tablet Take 1 tablet (2.5 mg total) by mouth 2 (two) times daily. 02/15/18  Yes Vaughan Basta, MD  aspirin EC 81 MG tablet Take 1 tablet (81 mg total) by mouth daily. 10/19/16  Yes Dustin Flock, MD  atorvastatin (LIPITOR) 80 MG tablet Take 80 mg by mouth at bedtime.  05/05/17  Yes [provider]  brimonidine (ALPHAGAN) 0.2 % ophthalmic solution Place 1 drop into both eyes 3 (three) times daily. 03/18/18  Yes Mody, Ulice Bold, MD  calcitRIOL (ROCALTROL) 0.5 MCG capsule Take  0.5 mcg by mouth daily.   Yes [provider]  carvedilol (COREG) 6.25 MG tablet Take 1 tablet (6.25 mg total) by mouth 2 (two) times daily. 03/18/18  Yes Mody, Ulice Bold, MD  docusate sodium (COLACE) 100 MG capsule Take 1 capsule (100 mg total) by mouth 2 (two) times daily. 02/15/18  Yes Vaughan Basta, MD  folic acid (FOLVITE) 1 MG tablet Take 1 tablet (1 mg total) by mouth daily. 02/15/18 02/15/19 Yes Vaughan Basta, MD  isosorbide mononitrate (IMDUR) 30 MG 24 hr tablet Take 30 mg by mouth daily.    Yes [provider]  levETIRAcetam (KEPPRA) 500 MG tablet Take 1 tablet (500 mg total) by mouth 3 (three) times a week for 3 days.  Patient taking differently: Take 500 mg by mouth 3 (three) times a week. Tuesday, Thursday and Saturday 03/19/18 03/22/18 Yes Mody, Ulice Bold, MD  midodrine (PROAMATINE) 10 MG tablet Take 1 tablet (10 mg total) by mouth 3 (three) times a week. Patient taking differently: Take 10 mg by mouth 3 (three) times a week. Take Tuesday, Thursday, and Saturday 02/17/18  Yes Vaughan Basta, MD  mirtazapine (REMERON) 7.5 MG tablet Take 7.5 mg by mouth at bedtime.   Yes [provider]  Multiple Vitamin (MULTIVITAMIN WITH MINERALS) TABS tablet Take 2 tablets by mouth daily. Gummy vitamins   Yes [provider]  Multiple Vitamins-Minerals (PRORENAL + D) TABS Take 1 tablet by mouth daily.   Yes [provider]  nitroGLYCERIN (NITROSTAT) 0.4 MG SL tablet Place 1 tablet (0.4 mg total) under the tongue every 5 (five) minutes as needed for chest pain. 09/05/16  Yes Wieting, Richard, MD  nizatidine (AXID) 150 MG capsule Take 1 capsule (150 mg total) by mouth 2 (two) times daily. 02/15/18  Yes Vaughan Basta, MD  Omega-3 300 MG CAPS Take 2 capsules by mouth daily.   Yes [provider]  ondansetron (ZOFRAN) 4 MG tablet Take 4 mg by mouth every 8 (eight) hours as needed for nausea or vomiting.   Yes [provider]  oxyCODONE (OXY IR/ROXICODONE) 5 MG immediate release tablet Take 1 tablet (5 mg total) by mouth every 6 (six) hours as needed for moderate pain or severe pain. 03/18/18  Yes Mody, Ulice Bold, MD  Polyethyl Glycol-Propyl Glycol 0.4-0.3 % SOLN Place 1 drop into both eyes 2 (two) times daily.   Yes [provider]  sevelamer carbonate (RENVELA) 800 MG tablet Take 800 mg by mouth 3 (three) times daily with meals.   Yes [provider]  timolol (TIMOPTIC) 0.5 % ophthalmic solution Place 1 drop into both eyes 3 (three) times daily. 03/18/18  Yes Bettey Costa, MD  vitamin B-12 (CYANOCOBALAMIN) 1000 MCG tablet Take 1 tablet (1,000 mcg total) by mouth daily. 02/15/18  Yes Vaughan Basta, MD  Nutritional Supplements (FEEDING SUPPLEMENT, NEPRO CARB STEADY,) LIQD Take 237 mLs by mouth 2 (two) times daily between meals. 02/15/18   Vaughan Basta, MD      VITAL SIGNS:  Blood pressure 111/68, pulse 61, SpO2 100 %.  PHYSICAL EXAMINATION:  GENERAL:  67 y.o.-year-old patient lying in the bed with no acute distress. Thin cachectic malnourished EYES: Pupils equal, round, reactive to light and accommodation. No scleral icterus. Extraocular muscles intact.  HEENT: Head atraumatic, normocephalic. Oropharynx and nasopharynx clear.  NECK:  Supple, no jugular venous distention. No thyroid enlargement, no tenderness.  LUNGS: Normal breath sounds bilaterally, no wheezing, rales,rhonchi or crepitation. No use of accessory muscles of respiration.  CARDIOVASCULAR: S1, S2 normal. No murmurs, rubs, or gallops.  ABDOMEN: Soft, nontender, nondistended. Bowel sounds present. No organomegaly or mass.  EXTREMITIES: bilateral below knee amputation NEUROLOGIC: Cranial nerves II through XII are intact.  PSYCHIATRIC: The patient is alert and oriented x 3.  SKIN: No obvious rash, lesion, or ulcer.   LABORATORY PANEL:   CBC Recent Labs  Lab 03/18/18 2144  WBC 10.5  HGB 8.3*  HCT 27.8*  PLT 345    ------------------------------------------------------------------------------------------------------------------  Chemistries  Recent Labs  Lab 03/15/18 1236 03/18/18 2144  NA 136 140  K 4.3 4.2  CL 97* 96*  CO2 28 30  GLUCOSE 85 177*  BUN 22 30*  CREATININE 4.85* 6.38*  CALCIUM 8.2* 8.6*  MG 2.0  --    ------------------------------------------------------------------------------------------------------------------  Cardiac Enzymes Recent Labs  Lab 03/14/18 2302  TROPONINI 0.21*   ------------------------------------------------------------------------------------------------------------------  RADIOLOGY:  Ct Head Wo Contrast  Result Date: 03/17/2018 CLINICAL DATA:  66 y/o  F; confusion and agitation. EXAM: CT HEAD WITHOUT CONTRAST TECHNIQUE: Contiguous axial images were obtained from the base of the skull through the vertex without intravenous contrast. COMPARISON:  03/14/2018 MRI head. 03/13/2018 CT head. FINDINGS: Brain: No evidence of acute infarction, hemorrhage, hydrocephalus, extra-axial collection or mass lesion/mass effect. Stable chronic microvascular ischemic changes and volume loss of the brain. 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 Vascular: Calcific atherosclerosis of the carotid siphons and vertebral arteries. No hyperdense vessel identified. Skull: Normal. Negative for fracture or focal lesion. Sinuses/Orbits: Stable opacification of the left sphenoid sinus extending into left posterior ethmoid air cells, possibly underlying polyp or mucocele. Additional visible paranasal sinuses and the mastoid air cells are normally aerated. Bilateral intra-ocular lens replacement. Other: None. IMPRESSION: 1. No acute intracranial abnormality identified. 2. Stable chronic microvascular ischemic changes and volume loss of the brain. 3. Stable opacification of left sphenoid and posterior ethmoid air cells with mild expansion, possibly underlying  polyp or mucocele. Electronically Signed   By: Kristine Garbe M.D.   On: 03/17/2018 20:58   Dg Chest Portable 1 View  Result Date: 03/19/2018 CLINICAL DATA:  Syncope. History of heart failure, coronary artery disease, GERD, heart murmur. EXAM: PORTABLE CHEST 1 VIEW COMPARISON:  Chest x-rays dated 03/17/2018 03/15/2018. FINDINGS: Stable cardiomegaly. LEFT-sided dialysis catheter is stable in position with tip at the level of the RIGHT atrium. Probable mild atelectasis and/or small pleural effusion at the LEFT lung base. Lungs are otherwise clear. No pneumothorax seen. IMPRESSION: 1. Probable mild atelectasis and/or small pleural effusion at the LEFT lung base. 2. Stable cardiomegaly. Electronically Signed   By: Franki Cabot M.D.   On: 03/19/2018 09:40    EKG:  ventricular premature contraction, LAE, LVH, prolonged QT interval  IMPRESSION AND PLAN:   Stephanie Ellis  is a 67 y.o. female with a known history of severe peripheral vascular disease status post bilateral recent below knee amputation, chronic anemia, end-stage renal disease on hemodialysis, CAD comes to the emergency room from [facility] after she had an episode of transient loss of consciousness.   1. transient loss of consciousness-- recurrent, etiology unclear -patient has had workup with recent two CT scans which were negative, MRI essentially nothing acute, neurology started patient empirically on Keppra -dose of Keppra was increased by Dr. Candiss Norse 250 mg extra after dialysis -neurology consultation with Dr. Irish Elders. Discussed with him -patient also had EEG did not show any epileptiform activity -EKG today shows prolonged QT interval... Previous EKG is reviewed acute intervals were within range -will repeat EKG tomorrow--- consider cardiology consultation if needed  2. ESR D on dialysis -nephrology consultation placed  3. Peripheral vascular disease with recent bilateral below knee amputation -continue  eliquis  4. Hypertension continue home meds  5. Discharge planning. Social worker consultation for discharge planning. Per daughter [facility] has declined to take patient back given her complex medical issues  6. DVT prophylaxis on eliquis  All the records are reviewed and case discussed with ED provider. Management plans discussed with the patient, family and they are in agreement.  CODE STATUS: DNR  TOTAL TIME TAKING CARE OF THIS PATIENT: 55 minutes.    Fritzi Mandes M.D on 03/19/2018 at 11:25 AM  Between 7am to 6pm - Pager - [phone]  After 6pm go to  [email or web address] - password [email or web address]  Office  [phone]  CC: Primary care physician; Patient, No Pcp Per

## 2018-03-19 NOTE — ED Notes (Signed)
Pt observed sleeping , = chest rise and fall ,call bell in reach , family at bedside . NAD noted .

## 2018-03-19 NOTE — ED Notes (Signed)
Pt transported to room 245

## 2018-03-19 NOTE — ED Notes (Signed)
Per medication instruction, will administer 1000 meds at 1150 for 3hr window.

## 2018-03-19 NOTE — Progress Notes (Signed)
[facility]  ROUNDING NOTE   Subjective:   Patient was discharged yesterday to nursing home.  Apparently she had an episode of unresponsiveness therefore was sent back to the emergency room for evaluation.  Patient does not remember the events.  Today morning she appears to be back to her normal self.  Ate snacks last night  Objective:  Vital signs in last 24 hours:  Temp:  [97.8 F (36.6 C)-98.3 F (36.8 C)] 97.8 F (36.6 C) (11/27 1553) Pulse Rate:  [61-84] 61 (11/28 0915) Resp:  [14-15] 15 (11/27 1553) BP: (76-175)/(30-146) 111/68 (11/28 0900) SpO2:  [83 %-100 %] 100 % (11/28 0915)  Weight change:  There were no vitals filed for this visit.  Intake/Output: No intake/output data recorded.   Intake/Output this shift:  No intake/output data recorded.  Physical Exam: General: NAD, laying in bed  Head: Normocephalic, atraumatic. Moist oral mucosal membranes  Eyes: Anicteric,   Neck: Supple,  Lungs:   Normal breathing effort, [place] O2  Heart: Regular rate and rhythm  Abdomen:  Soft, nontender,   Extremities:  bilateral BKA   Neurologic:  Sedated  Access:  Left IJ permcath    Basic Metabolic Panel: Recent Labs  Lab 03/13/18 1803  03/13/18 1815 03/14/18 1029 03/14/18 2123 03/15/18 1236 03/18/18 2144  NA  --   --  136 139 135 136 140  K  --   --  4.9 4.7 4.6 4.3 4.2  CL  --   --  97* 95* 94* 97* 96*  CO2  --   --  26 29 27 28 30   GLUCOSE  --   --  143* 121* 207* 85 177*  BUN  --   --  25* 36* 42* 22 30*  CREATININE  --   --  5.08* 6.42* 7.18* 4.85* 6.38*  CALCIUM  --    < > 8.6* 9.0 8.5* 8.2* 8.6*  MG 2.1  --   --   --  2.1 2.0  --   PHOS  --   --   --  5.3* 4.9* 3.3  --    < > = values in this interval not displayed.    Liver Function Tests: Recent Labs  Lab 03/14/18 1029  ALBUMIN 2.2*   No results for input(s): LIPASE, AMYLASE in the last 168 hours. No results for input(s): AMMONIA in the last 168 hours.  CBC: Recent Labs  Lab  03/14/18 1029 03/14/18 2302 03/15/18 0652 03/17/18 0553 03/18/18 2144  WBC 16.6* 16.3* 12.9* 11.5* 10.5  NEUTROABS  --  13.8* 9.6* 8.5*  --   HGB 8.6* 8.4* 8.6* 8.9* 8.3*  HCT 29.6* 28.3* 28.9* 30.7* 27.8*  MCV 103.5* 102.9* 102.8* 103.7* 101.5*  PLT 387 357 354 390 345    Cardiac Enzymes: Recent Labs  Lab 03/14/18 2302  TROPONINI 0.21*    BNP: Invalid input(s): POCBNP  CBG: Recent Labs  Lab 03/17/18 1530 03/17/18 1730 03/17/18 2022 03/18/18 0738 03/18/18 1229  GLUCAP 103* 144* 158* 132* 193*    Microbiology: Results for orders placed or performed during the hospital encounter of 03/08/18  MRSA PCR Screening     Status: None   Collection Time: 03/10/18  8:24 AM  Result Value Ref Range Status   MRSA by PCR NEGATIVE NEGATIVE Final    Comment:        The GeneXpert MRSA Assay (FDA approved for NASAL specimens only), is one component of a comprehensive MRSA colonization surveillance program. It is not  intended to diagnose MRSA infection nor to guide or monitor treatment for MRSA infections. Performed at [facility], [address] [place], [place] [address]   CULTURE, BLOOD (ROUTINE X 2) w Reflex to ID Panel     Status: None   Collection Time: 03/14/18  6:08 PM  Result Value Ref Range Status   Specimen Description BLOOD LEFT ANTECUBITAL  Final   Special Requests   Final    BOTTLES DRAWN AEROBIC AND ANAEROBIC Blood Culture results may not be optimal due to an excessive volume of blood received in culture bottles   Culture   Final    NO GROWTH 5 DAYS Performed at [facility], [address] [place], [place] [address]    Report Status 03/19/2018 FINAL  Final  CULTURE, BLOOD (ROUTINE X 2) w Reflex to ID Panel     Status: None   Collection Time: 03/14/18  7:27 PM  Result Value Ref Range Status   Specimen Description BLOOD LEFT ANTECUBITAL  Final   Special Requests BOTTLES DRAWN AEROBIC AND ANAEROBIC  Final   Culture   Final    NO  GROWTH 5 DAYS Performed at [facility], [address] [place],  [address]    Report Status 03/19/2018 FINAL  Final    Coagulation Studies: No results for input(s): LABPROT, INR in the last 72 hours.  Urinalysis: No results for input(s): COLORURINE, LABSPEC, PHURINE, GLUCOSEU, HGBUR, BILIRUBINUR, KETONESUR, PROTEINUR, UROBILINOGEN, NITRITE, LEUKOCYTESUR in the last 72 hours.  Invalid input(s): APPERANCEUR    Imaging: Ct Head Wo Contrast  Result Date: 03/17/2018 CLINICAL DATA:  67 y/o  F; confusion and agitation. EXAM: CT HEAD WITHOUT CONTRAST TECHNIQUE: Contiguous axial images were obtained from the base of the skull through the vertex without intravenous contrast. COMPARISON:  03/14/2018 MRI head. 03/13/2018 CT head. FINDINGS: Brain: No evidence of acute infarction, hemorrhage, hydrocephalus, extra-axial collection or mass lesion/mass effect. Stable chronic microvascular ischemic changes and volume loss of the brain. 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 Vascular: Calcific atherosclerosis of the carotid siphons and vertebral arteries. No hyperdense vessel identified. Skull: Normal. Negative for fracture or focal lesion. Sinuses/Orbits: Stable opacification of the left sphenoid sinus extending into left posterior ethmoid air cells, possibly underlying polyp or mucocele. Additional visible paranasal sinuses and the mastoid air cells are normally aerated. Bilateral intra-ocular lens replacement. Other: None. IMPRESSION: 1. No acute intracranial abnormality identified. 2. Stable chronic microvascular ischemic changes and volume loss of the brain. 3. Stable opacification of left sphenoid and posterior ethmoid air cells with mild expansion, possibly underlying polyp or mucocele. Electronically Signed   By: Kristine Garbe M.D.   On: 03/17/2018 20:58   Dg Chest Portable 1 View  Result Date: 03/19/2018 CLINICAL DATA:  Syncope. History of  heart failure, coronary artery disease, GERD, heart murmur. EXAM: PORTABLE CHEST 1 VIEW COMPARISON:  Chest x-rays dated 03/17/2018 03/15/2018. FINDINGS: Stable cardiomegaly. LEFT-sided dialysis catheter is stable in position with tip at the level of the RIGHT atrium. Probable mild atelectasis and/or small pleural effusion at the LEFT lung base. Lungs are otherwise clear. No pneumothorax seen. IMPRESSION: 1. Probable mild atelectasis and/or small pleural effusion at the LEFT lung base. 2. Stable cardiomegaly. Electronically Signed   By: Franki Cabot M.D.   On: 03/19/2018 09:40     Medications:    . apixaban  2.5 mg Oral BID  . aspirin EC  81 mg Oral Daily  . atorvastatin  80 mg  Oral QHS  . brimonidine  1 drop Both Eyes TID  . calcitRIOL  0.5 mcg Oral Daily  . carvedilol  6.25 mg Oral BID  . docusate sodium  100 mg Oral BID  . famotidine  20 mg Oral Daily  . folic acid  1 mg Oral Daily  . isosorbide mononitrate  30 mg Oral Daily  . [START ON 03/20/2018] levETIRAcetam  500 mg Oral Once per day on Mon Wed Fri  . [START ON 03/20/2018] midodrine  10 mg Oral Once per day on Mon Wed Fri  . mirtazapine  7.5 mg Oral QHS  . multivitamin  1 tablet Oral Daily  . omega-3 acid ethyl esters  2 g Oral Daily  . polyvinyl alcohol  1 drop Both Eyes BID  . sevelamer carbonate  800 mg Oral TID WC  . timolol  1 drop Both Eyes TID  . vitamin B-12  500 mcg Oral Daily   ondansetron, oxyCODONE  Assessment/ Plan:  Ms. Joanmarie Tsang is a 67 y.o. 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  [facility] TTS hemodialysis IJ permcath  1. End Stage Renal Disease  TTS schedule Next HD planned for tomorrow  2. 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 Lucky Cowboy.   Echocardiogram on November 24 showed LVEF 40 to 45%, mild concentric LVH, diffuse hypokinesis, grade 3 diastolic dysfunction  3. Anemia of chronic kidney disease:  Hemoglobin 8.3 - EPO with hemodialysis treatment TTS - Multivitamin daily  4. Hypertension: Now with Hypotension - required vasopressors with hemodialysis treatment this admission -  carvedilol for now.   5. Secondary Hyperparathyroidism: Sevelamer with meals  6. Syncope: concern for seizures. Loss of consciousness episodes.  Keppra dose recommended 500 BID with extra dsoe after HD    LOS: 0 Hunt Zajicek 11/28/201910:12 AM

## 2018-03-19 NOTE — ED Notes (Signed)
Unable to give report at this time per floor. Will call back to give report.

## 2018-03-20 DIAGNOSIS — R4189 Other symptoms and signs involving cognitive functions and awareness: Secondary | ICD-10-CM

## 2018-03-20 LAB — CBC
HCT: 25.4 % — ABNORMAL LOW (ref 36.0–46.0)
Hemoglobin: 7.3 g/dL — ABNORMAL LOW (ref 12.0–15.0)
MCH: 30.2 pg (ref 26.0–34.0)
MCHC: 28.7 g/dL — ABNORMAL LOW (ref 30.0–36.0)
MCV: 105 fL — ABNORMAL HIGH (ref 80.0–100.0)
Platelets: 335 10*3/uL (ref 150–400)
RBC: 2.42 MIL/uL — ABNORMAL LOW (ref 3.87–5.11)
RDW: 20.3 % — ABNORMAL HIGH (ref 11.5–15.5)
WBC: 9.8 10*3/uL (ref 4.0–10.5)
nRBC: 0 % (ref 0.0–0.2)

## 2018-03-20 LAB — RENAL FUNCTION PANEL
Albumin: 2.1 g/dL — ABNORMAL LOW (ref 3.5–5.0)
Anion gap: 13 (ref 5–15)
BUN: 39 mg/dL — ABNORMAL HIGH (ref 8–23)
CO2: 29 mmol/L (ref 22–32)
Calcium: 8.6 mg/dL — ABNORMAL LOW (ref 8.9–10.3)
Chloride: 97 mmol/L — ABNORMAL LOW (ref 98–111)
Creatinine, Ser: 8.89 mg/dL — ABNORMAL HIGH (ref 0.44–1.00)
GFR calc Af Amer: 5 mL/min — ABNORMAL LOW (ref >60)
GFR calc non Af Amer: 4 mL/min — ABNORMAL LOW (ref >60)
Glucose, Bld: 144 mg/dL — ABNORMAL HIGH (ref 70–99)
Phosphorus: 4.2 mg/dL (ref 2.5–4.6)
Potassium: 4.1 mmol/L (ref 3.5–5.1)
Sodium: 139 mmol/L (ref 135–145)

## 2018-03-20 LAB — MRSA PCR SCREENING: MRSA by PCR: NEGATIVE

## 2018-03-20 MED ORDER — EPOETIN ALFA 10000 UNIT/ML IJ SOLN
4000.0000 [IU] | Freq: Once | INTRAMUSCULAR | Status: AC
  Administered 2018-03-20: 4000 [IU] via INTRAVENOUS

## 2018-03-20 NOTE — Progress Notes (Signed)
[place] [facility] [place] NAME: Carlisle Torgeson    MR#:  [id number]  DATE OF BIRTH:  [birth year]  SUBJECTIVE:  CHIEF COMPLAINT: No other episodes of transient loss of consciousness noticed.  Patient had hemodialysis and tolerated well.  REVIEW OF SYSTEMS:  CONSTITUTIONAL: No fever, fatigue.  Reporting weakness EYES: No blurred or double vision.  EARS, NOSE, AND THROAT: No tinnitus or ear pain.  RESPIRATORY: No cough, shortness of breath, wheezing or hemoptysis.  CARDIOVASCULAR: No chest pain, orthopnea, edema.  GASTROINTESTINAL: No nausea, vomiting, diarrhea or abdominal pain.  GENITOURINARY: No dysuria, hematuria.  ENDOCRINE: No polyuria, nocturia,  HEMATOLOGY: No anemia, easy bruising or bleeding SKIN: No rash or lesion. MUSCULOSKELETAL: Reporting leg pain NEUROLOGIC: No tingling, numbness, weakness.  PSYCHIATRY: No anxiety or depression.   DRUG ALLERGIES:   Allergies  Allergen Reactions  . Chlorthalidone Anaphylaxis, Itching and Rash  . Fentanyl Rash  . Midazolam Rash  . Ace Inhibitors Other (See Comments)    Reaction:  Hyperkalemia, agitation   . Angiotensin Receptor Blockers Other (See Comments)    Reaction:  Hyperkalemia, agitation   . Norvasc [Amlodipine] Itching and Rash  . Phenergan [Promethazine Hcl] Anxiety    "antsy, can't sit still"    VITALS:  Blood pressure (!) 181/55, pulse (!) 59, temperature 98 F (36.7 C), temperature source Oral, resp. rate 15, height 5' 4"  (1.626 m), weight 53.9 kg, SpO2 100 %.  PHYSICAL EXAMINATION:  GENERAL:  67 y.o.-year-old patient lying in the bed with no acute distress.  EYES: Pupils equal, round, reactive to light and accommodation. No scleral icterus. Extraocular muscles intact.  HEENT: Head atraumatic, normocephalic. Oropharynx and nasopharynx clear.  NECK:  Supple, no jugular venous distention. No thyroid enlargement, no tenderness.  LUNGS: Normal breath sounds bilaterally, no  wheezing, rales,rhonchi or crepitation. No use of accessory muscles of respiration.  CARDIOVASCULAR: S1, S2 normal. No murmurs, rubs, or gallops.  ABDOMEN: Soft, nontender, nondistended. Bowel sounds present. No organomegaly or mass.  EXTREMITIES: Bilateral BKA  nEUROLOGIC: Cranial nerves II through XII are intact. Muscle strength 5/5 in all extremities. Sensation intact. Gait not checked.  PSYCHIATRIC: The patient is alert and oriented x 3.  SKIN: No obvious rash, lesion, or ulcer.    LABORATORY PANEL:   CBC Recent Labs  Lab 03/20/18 1258  WBC 9.8  HGB 7.3*  HCT 25.4*  PLT 335   ------------------------------------------------------------------------------------------------------------------  Chemistries  Recent Labs  Lab 03/15/18 1236  03/20/18 1258  NA 136   < > 139  K 4.3   < > 4.1  CL 97*   < > 97*  CO2 28   < > 29  GLUCOSE 85   < > 144*  BUN 22   < > 39*  CREATININE 4.85*   < > 8.89*  CALCIUM 8.2*   < > 8.6*  MG 2.0  --   --    < > = values in this interval not displayed.   ------------------------------------------------------------------------------------------------------------------  Cardiac Enzymes Recent Labs  Lab 03/14/18 2302  TROPONINI 0.21*   ------------------------------------------------------------------------------------------------------------------  RADIOLOGY:  Dg Chest Portable 1 View  Result Date: 03/19/2018 CLINICAL DATA:  Syncope. History of heart failure, coronary artery disease, GERD, heart murmur. EXAM: PORTABLE CHEST 1 VIEW COMPARISON:  Chest x-rays dated 03/17/2018 03/15/2018. FINDINGS: Stable cardiomegaly. LEFT-sided dialysis catheter is stable in position with tip at the level of the RIGHT atrium. Probable mild atelectasis and/or small pleural effusion at the LEFT lung base.  Lungs are otherwise clear. No pneumothorax seen. IMPRESSION: 1. Probable mild atelectasis and/or small pleural effusion at the LEFT lung base. 2. Stable  cardiomegaly. Electronically Signed   By: Franki Cabot M.D.   On: 03/19/2018 09:40    EKG:   Orders placed or performed during the hospital encounter of 03/18/18  . ED EKG  . ED EKG    ASSESSMENT AND PLAN:   Munirah Doerner  is a 67 y.o. female with a known history of severe peripheral vascular disease status post bilateral recent below knee amputation, chronic anemia, end-stage renal disease on hemodialysis, CAD comes to the emergency room from [facility] after she had an episode of transient loss of consciousness.   1. transient loss of consciousness-- recurrent, etiology unclear -patient has had workup with recent two CT scans which were negative, MRI essentially nothing acute, neurology started patient empirically on Keppra -neurology consultation with Dr. Irish Elders. Discussed with him -patient also had EEG did not show any epileptiform activity -EKG today shows prolonged QT interval... Previous EKG is reviewed acute intervals were within range -will repeat EKG tomorrow- -seen by neurology who is recommending prolonged EEG course and agreeable with Keppra 500 mg twice daily and Keppra post hemodialysis -Neurochecks  2. ESR D on dialysis -nephrology following -Had hemodialysis today  3. Peripheral vascular disease with recent bilateral below knee amputation -continue eliquis  4. Hypertension continue home meds  5. Discharge planning. Social worker consultation for discharge planning. Per daughter [facility] has declined to take patient back given her complex medical issues  6. DVT prophylaxis on eliquis  Disposition back to [facility] when medically stable     All the records are reviewed and case discussed with Care Management/Social Workerr. Management plans discussed with the patient, family and they are in agreement.  CODE STATUS: DNR  TOTAL TIME TAKING CARE OF THIS PATIENT: 35 minutes.   POSSIBLE D/C IN 2 DAYS, DEPENDING ON CLINICAL  CONDITION.  Note: This dictation was prepared with Dragon dictation along with smaller phrase technology. Any transcriptional errors that result from this process are unintentional.   [name] M.D on 03/20/2018 at 1:58 PM  Between 7am to 6pm - Pager - [phone] After 6pm go to [email or web address] - password [email or web address] [facility]  Office  [phone]  CC: Primary care physician; Patient, No Pcp Per

## 2018-03-20 NOTE — Consult Note (Signed)
Reason for Consult: confusion  Referring Physician: Dr. Posey Pronto  CC: confusion   HPI: Stephanie Ellis is an 67 y.o. female female with a known history of severe peripheral vascular disease status post bilateral recent below knee amputation, chronic anemia, end-stage renal disease on hemodialysis, CAD comes to the emergency room from [facility] after she had an episode of transient loss of consciousness.  Pt has been having these episodes on and off where she passes out and returns back in a couple minutes. She does not remember or does not have any warnings/signs symptoms prior to this episode.I personally have sen her for same episode few days ago.    Past Medical History:  Diagnosis Date  . (HFpEF) heart failure with preserved ejection fraction ([place])    a. 01/2018 Echo: EF 50-55%, no rwma, Gr1 DD, triv AI, mild MR. Midly dil LA/RV, mod reduced RV fxn. Irreg thickening of TV w/ mobile echodensity that appears to arise from valve.  . Anemia   . Anorexia   . Bacteremia due to Pseudomonas   . Carotid arterial disease ([place])    a. 01/2017 Carotid U/S: RICA <74, LICA <25.  Marland Kitchen Complication of anesthesia    a. Pt reports h/o complication on 9 different occasions - ? hypotension/arrest.  . 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 01/2015 Cath [facility]): 70% LM, p-mLAD 50-60% (Resting FFR 0.75), mRCA 80-90%, ~40 Ost OM & D1-->CABG; b. 01/2016 Staged PCI of LCX x 2 and RCA.  Marland Kitchen ESRD (end stage renal disease) on dialysis ([place])    a. ESRD secondary to acute kidney failure s/p CABG-->PD  . Essential hypertension   . GERD (gastroesophageal reflux disease)   . Heart murmur   . Hypercholesterolemia   . Myocardial infarction ([place]) 2016  . PVD (peripheral vascular disease) ([place])    a. 01/30/2018 PV Angio: Sev Left Popliteal dzs s/p PTA w/ 45m lutonix DEB w/ mech aspiration of the L popliteal, L AT, and Left tibioperoneal trunck and peroneal artery.  . S/P CABG x 3 03/24/2015    a.  [facility]: Dr. BMarland KitchenHaithcock: CABG x 3, LIMA to LAD, SVG to RCA, SVG to OM3, EVH  . Sinusitis 2019  . Type II diabetes mellitus with complication (HCC)    CAD    Past Surgical History:  Procedure Laterality Date  . ABDOMINAL HYSTERECTOMY    . AMPUTATION Right 02/25/2018   Procedure: AMPUTATION BELOW KNEE;  Surgeon: SKatha Cabal MD;  Location: [facility] ORS;  Service: Vascular;  Laterality: Right;  . AMPUTATION Left 03/11/2018   Procedure: AMPUTATION BELOW KNEE;  Surgeon: DAlgernon Huxley MD;  Location: [facility];  Service: Vascular;  Laterality: Left;  . AMPUTATION TOE Left 02/06/2018   Procedure: AMPUTATION GREAT TOE;  Surgeon: FSamara Deist DPM;  Location: [facility];  Service: Podiatry;  Laterality: Left;  . ARTERIOVENOUS GRAFT PLACEMENT  05/2016  . AV FISTULA PLACEMENT Left 05/30/2016   Procedure: ARTERIOVENOUS graft;  Surgeon: JAlgernon Huxley MD;  Location: [facility];  Service: Vascular;  Laterality: Left;  . CAPD INSERTION N/A 10/30/2017   Procedure: LAPAROSCOPIC INSERTION CONTINUOUS AMBULATORY PERITONEAL DIALYSIS  (CAPD) CATHETER;  Surgeon: DAlgernon Huxley MD;  Location: [facility];  Service: Vascular;  Laterality: N/A;  . CARDIAC CATHETERIZATION  01/2015   [facility]: Ost LM 70%, p-m LAD 50-60% (Rest FFR + @ 0.75), mRCA 80-90%, ostD1 40%, pOM1 40%  . CATARACT EXTRACTION W/PHACO Left 01/18/2016   Procedure: CATARACT EXTRACTION PHACO AND  INTRAOCULAR LENS PLACEMENT (IOC);  Surgeon: Eulogio Bear, MD;  Location: [facility];  Service: Ophthalmology;  Laterality: Left;  [place] 1.05 AP% 15.5 CDE 10.16 Fluid Pack Lot # [id number]  . CATARACT EXTRACTION W/PHACO Right 08/01/2016   Procedure: CATARACT EXTRACTION PHACO AND INTRAOCULAR LENS PLACEMENT (IOC);  Surgeon: Eulogio Bear, MD;  Location: [facility];  Service: Ophthalmology;  Laterality: Right;  [place] 01:00.6 AP% 11.4 CDE 6.93  LOT # [id number]  . COLONOSCOPY    . CORONARY ANGIOPLASTY     SENTS 02/12/16  . CORONARY ARTERY BYPASS GRAFT  03/28/15     [facility]: Dr. Waldemar Dickens: LIMA to LAD, SVG to RCA, SVG to OM3, EVH  . DIALYSIS/PERMA CATHETER INSERTION N/A 02/11/2018   Procedure: DIALYSIS/PERMA CATHETER INSERTION;  Surgeon: Algernon Huxley, MD;  Location: [place] [facility];  Service: Cardiovascular;  Laterality: N/A;  . DIALYSIS/PERMA CATHETER REMOVAL Right 02/04/2018   Procedure: DIALYSIS/PERMA CATHETER REMOVAL;  Surgeon: Katha Cabal, MD;  Location: [place] [facility];  Service: Cardiovascular;  Laterality: Right;  . ESOPHAGOGASTRODUODENOSCOPY (EGD) WITH PROPOFOL N/A 10/24/2016   Procedure: ESOPHAGOGASTRODUODENOSCOPY (EGD) WITH PROPOFOL;  Surgeon: [name], MD;  Location: [facility];  Service: Endoscopy;  Laterality: N/A;  . EYE SURGERY Bilateral    cataract surgery  . EYE SURGERY     drains for glaucoma  . INSERTION EXPRESS TUBE SHUNT Right 08/01/2016   Procedure: INSERTION EXPRESS TUBE SHUNT;  Surgeon: Eulogio Bear, MD;  Location: [facility];  Service: Ophthalmology;  Laterality: Right;  . INSERTION OF AHMED VALVE Left 08/15/2016   Procedure: INSERTION OF AHMED VALVE;  Surgeon: Eulogio Bear, MD;  Location: [facility];  Service: Ophthalmology;  Laterality: Left;  . INSERTION OF DIALYSIS CATHETER    . LOWER EXTREMITY ANGIOGRAPHY Right 02/19/2018   Procedure: Lower Extremity Angiography;  Surgeon: Algernon Huxley, MD;  Location: [place] [facility];  Service: Cardiovascular;  Laterality: Right;  . LOWER EXTREMITY ANGIOGRAPHY Left 01/30/2018   Procedure: Lower Extremity Angiography;  Surgeon: Katha Cabal, MD;  Location: [place] [facility];  Service: Cardiovascular;  Laterality: Left;  . REMOVAL OF A DIALYSIS CATHETER N/A 02/25/2018   Procedure: REMOVAL OF A DIALYSIS CATHETER;  Surgeon: Katha Cabal, MD;  Location: [facility];  Service: Vascular;  Laterality: N/A;  . TEMPORARY DIALYSIS CATHETER N/A 02/06/2018   Procedure: TEMPORARY DIALYSIS CATHETER;  Surgeon: Katha Cabal, MD;  Location: [place] [facility];   Service: Cardiovascular;  Laterality: N/A;  . TRANSTHORACIC ECHOCARDIOGRAM  January 2017    EF 60-65%. GR 2 DD. Mild degenerative mitral valve disease but no prolapse or regurgitation. Mild left atrial dilation. Mild to moderate LVH. Pericardial effusion gone  . UPPER EXTREMITY ANGIOGRAPHY Left 06/27/2016   Procedure: Upper Extremity Angiography;  Surgeon: Algernon Huxley, MD;  Location: [place] [facility];  Service: Cardiovascular;  Laterality: Left;  . UPPER EXTREMITY ANGIOGRAPHY Left 08/22/2016   Procedure: Upper Extremity Angiography;  Surgeon: Algernon Huxley, MD;  Location: [place] [facility];  Service: Cardiovascular;  Laterality: Left;  Marland Kitchen VASCULAR SURGERY      Family History  Problem Relation Age of Onset  . Diabetes Mellitus II Mother   . Heart failure Mother   . Pancreatic cancer Father     Social History:  reports that she has never smoked. She has never used smokeless tobacco. She reports that she does not drink alcohol or use drugs.  Allergies  Allergen Reactions  . Chlorthalidone Anaphylaxis, Itching  and Rash  . Fentanyl Rash  . Midazolam Rash  . Ace Inhibitors Other (See Comments)    Reaction:  Hyperkalemia, agitation   . Angiotensin Receptor Blockers Other (See Comments)    Reaction:  Hyperkalemia, agitation   . Norvasc [Amlodipine] Itching and Rash  . Phenergan [Promethazine Hcl] Anxiety    "antsy, can't sit still"    Medications: I have reviewed the patient's current medications.  ROS: Confusion unable to obtain   Physical Examination: Blood pressure (!) 172/53, pulse (!) 57, temperature 98.2 F (36.8 C), temperature source Oral, resp. rate 12, height 5' 4"  (1.626 m), weight 53.9 kg, SpO2 100 %.  Mental Status: Lethargic, oriented to person and place but not to time and situation.  Cranial Nerves: II: Discs flat bilaterally; Visual fields grossly normal, pupils equal, round, reactive to light and accommodation III,IV, VI: ptosis not present, extra-ocular  motions intact bilaterally V,VII: smile symmetric, facial light touch sensationintact VIII: hearing normal bilaterally IX,X: gag reflex present XI: bilateral shoulder shrug XII: midline tongue extension Motor: Right :Upper extremity 5/5Left: Upper extremity 5/5  Right:BKALeft: BKA Tone and bulk:normal tone throughout; no atrophy noted Sensory: Pinprick and light touchintact bilaterally Deep Tendon Reflexes: 2+ and symmetric throughout Plantars: Bilateral BKA Cerebellar: Unable to assess Gait: Bilateral BKA  Laboratory Studies:   Basic Metabolic Panel: Recent Labs  Lab 03/13/18 1803  03/13/18 1815 03/14/18 1029 03/14/18 2123 03/15/18 1236 03/18/18 2144  NA  --   --  136 139 135 136 140  K  --   --  4.9 4.7 4.6 4.3 4.2  CL  --   --  97* 95* 94* 97* 96*  CO2  --   --  26 29 27 28 30   GLUCOSE  --   --  143* 121* 207* 85 177*  BUN  --   --  25* 36* 42* 22 30*  CREATININE  --   --  5.08* 6.42* 7.18* 4.85* 6.38*  CALCIUM  --    < > 8.6* 9.0 8.5* 8.2* 8.6*  MG 2.1  --   --   --  2.1 2.0  --   PHOS  --   --   --  5.3* 4.9* 3.3  --    < > = values in this interval not displayed.    Liver Function Tests: Recent Labs  Lab 03/14/18 1029  ALBUMIN 2.2*   No results for input(s): LIPASE, AMYLASE in the last 168 hours. No results for input(s): AMMONIA in the last 168 hours.  CBC: Recent Labs  Lab 03/14/18 1029 03/14/18 2302 03/15/18 0652 03/17/18 0553 03/18/18 2144  WBC 16.6* 16.3* 12.9* 11.5* 10.5  NEUTROABS  --  13.8* 9.6* 8.5*  --   HGB 8.6* 8.4* 8.6* 8.9* 8.3*  HCT 29.6* 28.3* 28.9* 30.7* 27.8*  MCV 103.5* 102.9* 102.8* 103.7* 101.5*  PLT 387 357 354 390 345    Cardiac Enzymes: Recent Labs  Lab 03/14/18 2302  TROPONINI 0.21*    BNP: Invalid input(s): POCBNP  CBG: Recent Labs  Lab 03/17/18 1530 03/17/18 1730 03/17/18 2022 03/18/18 0738 03/18/18 1229  GLUCAP 103* 144* 158* 132* 193*     Microbiology: Results for orders placed or performed during the hospital encounter of 03/18/18  MRSA PCR Screening     Status: None   Collection Time: 03/20/18  6:33 AM  Result Value Ref Range Status   MRSA by PCR NEGATIVE NEGATIVE Final    Comment:        The  GeneXpert MRSA Assay (FDA approved for NASAL specimens only), is one component of a comprehensive MRSA colonization surveillance program. It is not intended to diagnose MRSA infection nor to guide or monitor treatment for MRSA infections. Performed at [facility], [address] [place], [place] [address]     Coagulation Studies: No results for input(s): LABPROT, INR in the last 72 hours.  Urinalysis: No results for input(s): COLORURINE, LABSPEC, PHURINE, GLUCOSEU, HGBUR, BILIRUBINUR, KETONESUR, PROTEINUR, UROBILINOGEN, NITRITE, LEUKOCYTESUR in the last 168 hours.  Invalid input(s): APPERANCEUR  Lipid Panel:  No results found for: CHOL, TRIG, HDL, CHOLHDL, VLDL, LDLCALC  HgbA1C:  Lab Results  Component Value Date   HGBA1C 6.5 (H) 02/18/2018    Urine Drug Screen:  No results found for: LABOPIA, COCAINSCRNUR, LABBENZ, AMPHETMU, THCU, LABBARB  Alcohol Level: No results for input(s): ETH in the last 168 hours.  Other results: EKG: normal EKG, normal sinus rhythm, unchanged from previous tracings.  Imaging: Dg Chest Portable 1 View  Result Date: 03/19/2018 CLINICAL DATA:  Syncope. History of heart failure, coronary artery disease, GERD, heart murmur. EXAM: PORTABLE CHEST 1 VIEW COMPARISON:  Chest x-rays dated 03/17/2018 03/15/2018. FINDINGS: Stable cardiomegaly. LEFT-sided dialysis catheter is stable in position with tip at the level of the RIGHT atrium. Probable mild atelectasis and/or small pleural effusion at the LEFT lung base. Lungs are otherwise clear. No pneumothorax seen. IMPRESSION: 1. Probable mild atelectasis and/or small pleural effusion at the LEFT lung base. 2. Stable cardiomegaly.  Electronically Signed   By: Franki Cabot M.D.   On: 03/19/2018 09:40     Assessment/Plan:  67 y.o. female female with a known history of severe peripheral vascular disease status post bilateral recent below knee amputation, chronic anemia, end-stage renal disease on hemodialysis, CAD comes to the emergency room from [facility] after she had an episode of transient loss of consciousness.  Pt has been having these episodes on and off where she passes out and returns back in a couple minutes. She does not remember or does not have any warnings/signs symptoms prior to this episode.I personally have sen her for same episode few days ago.     - Multiple episodes recently - I have done an eeg and no seizure activity or epileptiform discharges but this was short 20 min EEG - there is a possibility she could be having seizures and needs prolonged EEG course - I agree with increase in Keppra 500 BID and Keppra post HD - possible out pt prolonged EEG  03/20/2018, 11:00 AM

## 2018-03-20 NOTE — Progress Notes (Signed)
Post HD Treatment    03/20/18 1340  Neurological  Level of Consciousness Alert  Orientation Level Oriented to person;Oriented to place;Oriented to situation;Disoriented to time  Respiratory  Respiratory Pattern Regular;Unlabored;Symmetrical  Chest Assessment Chest expansion symmetrical  Bilateral Breath Sounds Diminished  Cardiac  Pulse Regular  Heart Sounds S1, S2  Jugular Venous Distention (JVD) No  ECG Monitor Yes  Cardiac Rhythm NSR  Ectopy Multifocal PVC's  Ectopy Frequency Frequent  Antiarrhythmic device No  Vascular  R Radial Pulse +2  L Radial Pulse +2  R Dorsalis Pedis Pulse Other (Comment) (BKA)  L Dorsalis Pedis Pulse Other (Comment)  Integumentary  Integumentary (WDL) X  Skin Color Appropriate for ethnicity  Skin Condition Dry  Musculoskeletal  Musculoskeletal (WDL) X  Generalized Weakness Yes  GU Assessment  Genitourinary (WDL) X (HD pt)  Psychosocial  Psychosocial (WDL) WDL

## 2018-03-20 NOTE — Progress Notes (Signed)
Post HD Treatment  Pt tolerated treatment well. HEr net UF was 1011 and her BVP was 59.3. All blood was returned to patient. Primary RN notified catheter dressing change was due, Janett Billow, RN states she will change dressing since patient is in route to floor.     03/20/18 1339  Hand-Off documentation  Report given to (Full Name) Georgina Quint, RN  Report received from (Full Name) Stephannie Peters, RN  Vital Signs  Temp 98 F (36.7 C)  Temp Source Oral  Pulse Rate (!) 59  Pulse Rate Source Monitor  Resp 15  BP (!) 181/55  BP Location Right Arm  BP Method Automatic  Patient Position (if appropriate) Sitting  Oxygen Therapy  SpO2 100 %  O2 Device Nasal Cannula  O2 Flow Rate (L/min) 2 L/min  Pain Assessment  Pain Scale 0-10  Pain Score 0  Post-Hemodialysis Assessment  Rinseback Volume (mL) 250 mL  KECN 192 V  Dialyzer Clearance Lightly streaked  Duration of HD Treatment -hour(s) 3.5 hour(s)  Hemodialysis Intake (mL) 500 mL  UF Total -Machine (mL) 1511 mL  Net UF (mL) 1011 mL  Tolerated HD Treatment Yes  Post-Hemodialysis Comments Pt tolerated well  Hemodialysis Catheter Left Subclavian Double-lumen  Placement Date: 02/11/18   Placed prior to admission: No  Orientation: Left  Access Location: Subclavian  Hemodialysis Catheter Type: Double-lumen  Site Condition No complications  Blue Lumen Status Capped (Central line);Heparin locked  Red Lumen Status Capped (Central line);Heparin locked  Purple Lumen Status N/A  Catheter fill solution Heparin 1000 units/ml  Catheter fill volume (Arterial) 1.7 cc  Catheter fill volume (Venous) 1.7  Dressing Type Biopatch;Occlusive  Dressing Status Clean;Dry;Intact  Drainage Description None  Dressing Change Due 03/19/18  Post treatment catheter status Capped and Clamped

## 2018-03-20 NOTE — Clinical Social Work Note (Signed)
Clinical Social Work Assessment  Patient Details  Name: Stephanie Ellis MRN: [id number] Date of Birth: 1951-02-07  Date of referral:  03/20/18               Reason for consult:  Facility Placement                Permission sought to share information with:  Family Supports, Customer service manager Permission granted to share information::  Yes, Verbal Permission Granted  Name::     [name] Daughter [phone]   Agency::  SNF admissions  Relationship::     Contact Information:     Housing/Transportation Living arrangements for the past 2 months:  [place] of Information:  Patient Patient Interpreter Needed:  None Criminal Activity/Legal Involvement Pertinent to Current Situation/Hospitalization:  No - Comment as needed Significant Relationships:  Adult Children Lives with:  Facility Resident Do you feel safe going back to the place where you live?  Yes Need for family participation in patient care:  Yes (Comment)  Care giving concerns: Patient and family are satisfied with the care that is being provided at [facility].  Social Worker assessment / plan: Patient is a 67 year old female who is alert and oriented x3.  Patient is currently a short term rehab patient at [facility], and plan is to return to continue with her therapy.  Patient has been at [facility] for a couple of weeks, and they have been pleased with the progress patient is making.  CSW discussed how insurance will pay for stay and what the role of CSW is to coordinate discharge planning.  Patient and her daughter were in agreement to returning back to [facility].   Employment status:  Retired Forensic scientist:  Medicare PT Recommendations:  [place] / Referral to community resources:  [place]  Patient/Family's Response to care:  Patient and family are agreeable to returning to SNF.  Patient/Family's Understanding of and  Emotional Response to Diagnosis, Current Treatment, and Prognosis:  Patient and family are aware of current treatment plan and prognosis.  Emotional Assessment Appearance:  Appears stated age Attitude/Demeanor/Rapport:    Affect (typically observed):  Appropriate, Pleasant, Stable Orientation:  Oriented to Self, Oriented to Place, Oriented to Situation Alcohol / Substance use:  Not Applicable Psych involvement (Current and /or in the community):  No (Comment)  Discharge Needs  Concerns to be addressed:  Care Coordination Readmission within the last 30 days:  Yes(03/18/18 to [facility]) Current discharge risk:  None Barriers to Discharge:  Continued Medical Work up   Anell Barr 03/20/2018, 5:47 PM

## 2018-03-20 NOTE — Progress Notes (Addendum)
Pre HD Assessment  Breath sounds diminished. Regular, unlabored breathing.     03/20/18 0940  Neurological  Level of Consciousness Alert  Orientation Level Oriented to person;Oriented to place;Oriented to situation;Disoriented to time  Cardiac  Pulse Regular  Heart Sounds S1, S2  Jugular Venous Distention (JVD) No  ECG Monitor Yes  Cardiac Rhythm NSR  Ectopy Multifocal PVC's  Ectopy Frequency Frequent  Antiarrhythmic device No  Vascular  R Radial Pulse +2  L Radial Pulse +2  R Dorsalis Pedis Pulse Other (Comment) (BKA)  L Dorsalis Pedis Pulse Other (Comment)  Integumentary  Integumentary (WDL) X  Skin Color Appropriate for ethnicity  Skin Condition Dry  Musculoskeletal  Musculoskeletal (WDL) X  Generalized Weakness Yes  GU Assessment  Genitourinary (WDL) X (HD pt)  Psychosocial  Psychosocial (WDL) WDL

## 2018-03-20 NOTE — Clinical Social Work Note (Addendum)
CSW spoke to [facility] and they can accept patient once she is medically ready for discharge.  CSW spoke to patient's daughter [name], [phone] and she is in agreement to having patient return to SNF once she is ready for discharge.  CSW to continue to follow patient's progress throughout discharge planning.  Jones Broom. [place], MSW, [place]  03/20/2018 5:10 PM

## 2018-03-20 NOTE — Progress Notes (Signed)
[facility]  ROUNDING NOTE   Subjective:   Patient seen during hemodialysis session.  Denies any acute complaints.  Mental status appears to be at baseline   HEMODIALYSIS FLOWSHEET:  Blood Flow Rate (mL/min): 300 mL/min Arterial Pressure (mmHg): -270 mmHg Venous Pressure (mmHg): 90 mmHg Transmembrane Pressure (mmHg): 50 mmHg Ultrafiltration Rate (mL/min): 430 mL/min Dialysate Flow Rate (mL/min): 600 ml/min Conductivity: Machine : 14 Conductivity: Machine : 14 Dialysis Fluid Bolus: Normal Saline Bolus Amount (mL): 250 mL    Objective:  Vital signs in last 24 hours:  Temp:  [97.7 F (36.5 C)-98.6 F (37 C)] (P) 97.7 F (36.5 C) (11/29 0945) Pulse Rate:  [55-61] 58 (11/29 1100) Resp:  [9-20] 9 (11/29 1100) BP: (99-172)/(41-67) 172/53 (11/29 1045) SpO2:  [96 %-100 %] 100 % (11/29 1100) Weight:  [53.9 kg-54 kg] 53.9 kg (11/29 0424)  Weight change:  Filed Weights   03/19/18 1151 03/20/18 0424  Weight: 54 kg 53.9 kg    Intake/Output: No intake/output data recorded.   Intake/Output this shift:  Total I/O In: 240 [P.O.:240] Out: -   Physical Exam: General: NAD, sitting up in recliner chair  Head: Normocephalic, atraumatic. Moist oral mucosal membranes  Eyes: Anicteric,   Neck: Supple,  Lungs:   Normal breathing effort, Ammon O2  Heart: Regular rate and rhythm  Abdomen:  Soft, nontender,   Extremities:  bilateral BKA   Neurologic:  Alert, able to answer questions appropriately  Access:  Left IJ permcath    Basic Metabolic Panel: Recent Labs  Lab 03/13/18 1803  03/13/18 1815 03/14/18 1029 03/14/18 2123 03/15/18 1236 03/18/18 2144  NA  --   --  136 139 135 136 140  K  --   --  4.9 4.7 4.6 4.3 4.2  CL  --   --  97* 95* 94* 97* 96*  CO2  --   --  26 29 27 28 30   GLUCOSE  --   --  143* 121* 207* 85 177*  BUN  --   --  25* 36* 42* 22 30*  CREATININE  --   --  5.08* 6.42* 7.18* 4.85* 6.38*  CALCIUM  --    < > 8.6* 9.0 8.5* 8.2* 8.6*  MG 2.1  --    --   --  2.1 2.0  --   PHOS  --   --   --  5.3* 4.9* 3.3  --    < > = values in this interval not displayed.    Liver Function Tests: Recent Labs  Lab 03/14/18 1029  ALBUMIN 2.2*   No results for input(s): LIPASE, AMYLASE in the last 168 hours. No results for input(s): AMMONIA in the last 168 hours.  CBC: Recent Labs  Lab 03/14/18 1029 03/14/18 2302 03/15/18 0652 03/17/18 0553 03/18/18 2144  WBC 16.6* 16.3* 12.9* 11.5* 10.5  NEUTROABS  --  13.8* 9.6* 8.5*  --   HGB 8.6* 8.4* 8.6* 8.9* 8.3*  HCT 29.6* 28.3* 28.9* 30.7* 27.8*  MCV 103.5* 102.9* 102.8* 103.7* 101.5*  PLT 387 357 354 390 345    Cardiac Enzymes: Recent Labs  Lab 03/14/18 2302  TROPONINI 0.21*    BNP: Invalid input(s): POCBNP  CBG: Recent Labs  Lab 03/17/18 1530 03/17/18 1730 03/17/18 2022 03/18/18 0738 03/18/18 1229  GLUCAP 103* 144* 158* 132* 193*    Microbiology: Results for orders placed or performed during the hospital encounter of 03/18/18  MRSA PCR Screening     Status: None  Collection Time: 03/20/18  6:33 AM  Result Value Ref Range Status   MRSA by PCR NEGATIVE NEGATIVE Final    Comment:        The GeneXpert MRSA Assay (FDA approved for NASAL specimens only), is one component of a comprehensive MRSA colonization surveillance program. It is not intended to diagnose MRSA infection nor to guide or monitor treatment for MRSA infections. Performed at [facility], [address] [place], [place] [address]     Coagulation Studies: No results for input(s): LABPROT, INR in the last 72 hours.  Urinalysis: No results for input(s): COLORURINE, LABSPEC, PHURINE, GLUCOSEU, HGBUR, BILIRUBINUR, KETONESUR, PROTEINUR, UROBILINOGEN, NITRITE, LEUKOCYTESUR in the last 72 hours.  Invalid input(s): APPERANCEUR    Imaging: Dg Chest Portable 1 View  Result Date: 03/19/2018 CLINICAL DATA:  Syncope. History of heart failure, coronary artery disease, GERD, heart murmur. EXAM:  PORTABLE CHEST 1 VIEW COMPARISON:  Chest x-rays dated 03/17/2018 03/15/2018. FINDINGS: Stable cardiomegaly. LEFT-sided dialysis catheter is stable in position with tip at the level of the RIGHT atrium. Probable mild atelectasis and/or small pleural effusion at the LEFT lung base. Lungs are otherwise clear. No pneumothorax seen. IMPRESSION: 1. Probable mild atelectasis and/or small pleural effusion at the LEFT lung base. 2. Stable cardiomegaly. Electronically Signed   By: Franki Cabot M.D.   On: 03/19/2018 09:40     Medications:    . apixaban  2.5 mg Oral BID  . aspirin EC  81 mg Oral Daily  . atorvastatin  80 mg Oral QHS  . brimonidine  1 drop Both Eyes TID  . calcitRIOL  0.5 mcg Oral Daily  . carvedilol  6.25 mg Oral BID  . Chlorhexidine Gluconate Cloth  6 each Topical Q0600  . docusate sodium  100 mg Oral BID  . famotidine  20 mg Oral Daily  . folic acid  1 mg Oral Daily  . isosorbide mononitrate  30 mg Oral Daily  . levETIRAcetam  250 mg Oral Once per day on Mon Wed Fri  . levETIRAcetam  500 mg Oral BID  . levETIRAcetam  500 mg Oral Once  . midodrine  10 mg Oral Once per day on Mon Wed Fri  . mirtazapine  7.5 mg Oral QHS  . multivitamin  1 tablet Oral Daily  . omega-3 acid ethyl esters  2 g Oral Daily  . polyvinyl alcohol  1 drop Both Eyes BID  . sevelamer carbonate  800 mg Oral TID WC  . timolol  1 drop Both Eyes TID  . vitamin B-12  500 mcg Oral Daily   acetaminophen **OR** acetaminophen, ondansetron **OR** ondansetron (ZOFRAN) IV, traMADol  Assessment/ Plan:  Stephanie Ellis is a 67 y.o. 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  [facility] [place] TTS hemodialysis IJ permcath  1. End Stage Renal Disease  Plan hemodialysis today to monitor for syncopal  episodes.  So far she is tolerating well.  2. 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 Lucky Cowboy.  Echocardiogram on November 24 showed LVEF 40 to 45%, mild concentric LVH, diffuse hypokinesis, grade 3 diastolic dysfunction  3. Anemia of chronic kidney disease:  Hemoglobin 8.3 - EPO with hemodialysis treatment TTS - Multivitamin daily  4. Hypertension: Now with Hypotension - required vasopressors with hemodialysis treatment this admission -  carvedilol for now.   5. Secondary Hyperparathyroidism: Sevelamer with meals  6. Syncope: concern for seizures. Loss of consciousness episodes.  Neurology evaluation ongoing Keppra dose has been adjusted    LOS: 1 Desare Duddy 11/29/201911:07 AM

## 2018-03-20 NOTE — Progress Notes (Signed)
HD Treatment Complete    03/20/18 1335  Vital Signs  Pulse Rate (!) 58  Pulse Rate Source Monitor  Resp 16  Patient Position (if appropriate) Sitting  Oxygen Therapy  SpO2 100 %  O2 Device Nasal Cannula  O2 Flow Rate (L/min) 4 L/min  During Hemodialysis Assessment  Blood Flow Rate (mL/min) 325 mL/min  Arterial Pressure (mmHg) -240 mmHg  Venous Pressure (mmHg) 100 mmHg  Transmembrane Pressure (mmHg) 50 mmHg  Ultrafiltration Rate (mL/min) 440 mL/min  Dialysate Flow Rate (mL/min) 600 ml/min  Conductivity: Machine  14  HD Safety Checks Performed Yes  Intra-Hemodialysis Comments Tolerated well;Tx completed (UF 1511)

## 2018-03-20 NOTE — NC FL2 (Signed)
[place] LEVEL OF CARE SCREENING TOOL     IDENTIFICATION  Patient Name: Stephanie Ellis Birthdate: 02/06/1951 Sex: female Admission Date (Current Location): 03/18/2018  [place] and [place] Number:  Engineering geologist and Address:  [facility], [address] [place], [place] [address]      Provider Number: [id number]  Attending Physician Name and Address:  [name], MD  Relative Name and Phone Number:       Current Level of Care: Hospital Recommended Level of Care: [place] Prior Approval Number:    Date Approved/Denied:   PASRR Number: [id number]  Discharge Plan: SNF    Current Diagnoses: Patient Active Problem List   Diagnosis Date Noted  . Transient loss of consciousness 03/19/2018  . Transient alteration of awareness   . VT (ventricular tachycardia) ([place])   . Disorientation   . Hypertensive urgency 03/08/2018  . Ischemia of extremity 02/18/2018  . ESRD (end stage renal disease) ([place])   . Diabetic osteomyelitis ([place])   . Peripheral vascular disease ([place])   . Advanced care planning/counseling discussion   . Palliative care by specialist   . Goals of care, counseling/discussion   . Diabetes mellitus, type 2 ([place]) 01/30/2018  . Sepsis ([place]) 01/29/2018  . Peripheral neuropathy 06/17/2017  . Encephalopathy 02/06/2017  . Hypertensive emergency 02/06/2017  . Endophthalmitis, left eye 12/05/2016  . Vomiting 10/24/2016  . Hematuria 10/17/2016  . Acute lower UTI 10/17/2016  . Anesthesia complication 94/49/6759  . Chest pain with moderate risk for cardiac etiology 09/04/2016  . Steal syndrome dialysis vascular access, initial encounter ([place]) 06/18/2016  . 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 Coronary artery disease involving coronary bypass graft of native heart with angina pectoris ([place]) 02/10/2016  . Hypertension 02/02/2016  . Hyperlipidemia 02/02/2016  . Coronary artery  disease involving left main coronary artery 10/04/2015  . Abdominal pain of unknown etiology 10/04/2015  . ESRD (end stage renal disease) on dialysis ([place])   . Type II diabetes mellitus with complication ([place])   . NSTEMI (non-ST elevated myocardial infarction) ([place])   . Right sided abdominal pain   . Elevated troponin 10/03/2015  . 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 Chronic diastolic congestive heart failure ([place]) 04/19/2014  . Stage 5 chronic kidney disease on chronic dialysis ([place]) 04/19/2014  . Anemia 09/20/2013  . Routine health maintenance 01/29/2013    Orientation RESPIRATION BLADDER Height & Weight     Self, Time, Situation, Place  O2(2L) Continent Weight: (unable to weigh) Height:  5' 4"  (162.6 cm)  BEHAVIORAL SYMPTOMS/MOOD NEUROLOGICAL BOWEL NUTRITION STATUS    Convulsions/Seizures Continent Diet(Renal Carb Modified)  AMBULATORY STATUS COMMUNICATION OF NEEDS Skin   Limited Assist Verbally Surgical wounds                       Personal Care Assistance Level of Assistance  Dressing, Feeding, Bathing Bathing Assistance: Limited assistance Feeding assistance: Limited assistance Dressing Assistance: Limited assistance     Functional Limitations Info  Sight, Hearing, Speech Sight Info: Adequate Hearing Info: Adequate Speech Info: Adequate    SPECIAL CARE FACTORS FREQUENCY  PT (By licensed PT)     PT Frequency: 5x a week              Contractures Contractures Info: Not present    Additional Factors Info  Code Status Code Status Info: DNR             Current Medications (03/20/2018):  This is the current hospital active medication list Current Facility-Administered Medications  Medication Dose Route Frequency Provider Last Rate Last Dose  . acetaminophen (TYLENOL) tablet 650 mg  650 mg Oral Q6H PRN Fritzi Mandes, MD   650 mg at 03/20/18 9892   Or  . acetaminophen (TYLENOL) suppository 650 mg  650 mg Rectal Q6H PRN Fritzi Mandes, MD      . apixaban Arne Cleveland)  tablet 2.5 mg  2.5 mg Oral BID Nance Pear, MD   2.5 mg at 03/20/18 1447  . aspirin EC tablet 81 mg  81 mg Oral Daily Nance Pear, MD   81 mg at 03/20/18 1447  . atorvastatin (LIPITOR) tablet 80 mg  80 mg Oral QHS Nance Pear, MD   80 mg at 03/19/18 2131  . brimonidine (ALPHAGAN) 0.2 % ophthalmic solution 1 drop  1 drop Both Eyes TID Nance Pear, MD   1 drop at 03/20/18 0906  . calcitRIOL (ROCALTROL) capsule 0.5 mcg  0.5 mcg Oral Daily Nance Pear, MD   0.5 mcg at 03/20/18 1446  . carvedilol (COREG) tablet 6.25 mg  6.25 mg Oral BID Nance Pear, MD   6.25 mg at 03/20/18 1446  . Chlorhexidine Gluconate Cloth 2 % PADS 6 each  6 each Topical Q0600 Murlean Iba, MD   6 each at 03/20/18 [phone]  . docusate sodium (COLACE) capsule 100 mg  100 mg Oral BID Nance Pear, MD   100 mg at 03/20/18 1446  . famotidine (PEPCID) tablet 20 mg  20 mg Oral Daily Nance Pear, MD   20 mg at 03/20/18 1447  . folic acid (FOLVITE) tablet 1 mg  1 mg Oral Daily Nance Pear, MD   1 mg at 03/20/18 1447  . isosorbide mononitrate (IMDUR) 24 hr tablet 30 mg  30 mg Oral Daily Nance Pear, MD   30 mg at 03/20/18 1447  . levETIRAcetam (KEPPRA) tablet 250 mg  250 mg Oral Once per day on Mon Wed Fri Murlean Iba, MD   250 mg at 03/20/18 1446  . levETIRAcetam (KEPPRA) tablet 500 mg  500 mg Oral BID Murlean Iba, MD   500 mg at 03/20/18 0906  . levETIRAcetam (KEPPRA) tablet 500 mg  500 mg Oral Once Murlean Iba, MD      . midodrine (PROAMATINE) tablet 10 mg  10 mg Oral Once per day on Mon Wed Fri Nance Pear, MD   10 mg at 03/20/18 0906  . mirtazapine (REMERON) tablet 7.5 mg  7.5 mg Oral QHS Nance Pear, MD   7.5 mg at 03/19/18 2132  . multivitamin (RENA-VIT) tablet 1 tablet  1 tablet Oral Daily Nance Pear, MD   1 tablet at 03/20/18 1446  . omega-3 acid ethyl esters (LOVAZA) capsule 2 g  2 g Oral Daily Nance Pear, MD   2 g at 03/20/18 1446  . ondansetron  (ZOFRAN) tablet 4 mg  4 mg Oral Q6H PRN Fritzi Mandes, MD       Or  . ondansetron [facility]) injection 4 mg  4 mg Intravenous Q6H PRN Fritzi Mandes, MD      . polyvinyl alcohol (LIQUIFILM TEARS) 1.4 % ophthalmic solution 1 drop  1 drop Both Eyes BID Nance Pear, MD   1 drop at 03/20/18 0907  . sevelamer carbonate (RENVELA) tablet 800 mg  800 mg Oral TID WC Nance Pear, MD   800 mg at 03/20/18 1446  . timolol (TIMOPTIC) 0.5 % ophthalmic solution 1 drop  1 drop Both Eyes TID  Nance Pear, MD   1 drop at 03/20/18 [phone]  . traMADol (ULTRAM) tablet 50 mg  50 mg Oral Q8H PRN Fritzi Mandes, MD      . vitamin B-12 (CYANOCOBALAMIN) tablet 500 mcg  500 mcg Oral Daily Nance Pear, MD   500 mcg at 03/20/18 1447     Discharge Medications: Please see discharge summary for a list of discharge medications.  Relevant Imaging Results:  Relevant Lab Results:   Additional Information ss: [id number]  Ross Ludwig, LCSWA

## 2018-03-21 MED ORDER — SODIUM CHLORIDE 0.9% FLUSH
3.0000 mL | Freq: Two times a day (BID) | INTRAVENOUS | Status: DC
  Administered 2018-03-21 – 2018-03-23 (×4): 3 mL via INTRAVENOUS

## 2018-03-21 MED ORDER — EPOETIN ALFA 10000 UNIT/ML IJ SOLN
10000.0000 [IU] | INTRAMUSCULAR | Status: DC
  Administered 2018-03-21: 10000 [IU] via INTRAVENOUS

## 2018-03-21 NOTE — Progress Notes (Signed)
No further events overnight.    Past Medical History:  Diagnosis Date  . (HFpEF) heart failure with preserved ejection fraction ([place])    a. 01/2018 Echo: EF 50-55%, no rwma, Gr1 DD, triv AI, mild MR. Midly dil LA/RV, mod reduced RV fxn. Irreg thickening of TV w/ mobile echodensity that appears to arise from valve.  . Anemia   . Anorexia   . Bacteremia due to Pseudomonas   . Carotid arterial disease ([place])    a. 01/2017 Carotid U/S: RICA <69, LICA <67.  Marland Kitchen Complication of anesthesia    a. Pt reports h/o complication on 9 different occasions - ? hypotension/arrest.  . 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 01/2015 Cath [facility]): 70% LM, p-mLAD 50-60% (Resting FFR 0.75), mRCA 80-90%, ~40 Ost OM & D1-->CABG; b. 01/2016 Staged PCI of LCX x 2 and RCA.  Marland Kitchen ESRD (end stage renal disease) on dialysis ([place])    a. ESRD secondary to acute kidney failure s/p CABG-->PD  . Essential hypertension   . GERD (gastroesophageal reflux disease)   . Heart murmur   . Hypercholesterolemia   . Myocardial infarction ([place]) 2016  . PVD (peripheral vascular disease) ([place])    a. 01/30/2018 PV Angio: Sev Left Popliteal dzs s/p PTA w/ 36m lutonix DEB w/ mech aspiration of the L popliteal, L AT, and Left tibioperoneal trunck and peroneal artery.  . 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  [facility]: Dr. BMarland KitchenHaithcock: CABG x 3, LIMA to LAD, SVG to RCA, SVG to OM3, EVH  . Sinusitis 2019  . Type II diabetes mellitus with complication (HCC)    CAD    Past Surgical History:  Procedure Laterality Date  . ABDOMINAL HYSTERECTOMY    . AMPUTATION Right 02/25/2018   Procedure: AMPUTATION BELOW KNEE;  Surgeon: SKatha Cabal MD;  Location: [facility] ORS;  Service: Vascular;  Laterality: Right;  . AMPUTATION Left 03/11/2018   Procedure: AMPUTATION BELOW KNEE;  Surgeon: DAlgernon Huxley MD;  Location: [facility];  Service: Vascular;  Laterality: Left;  . AMPUTATION TOE Left 02/06/2018   Procedure: AMPUTATION  GREAT TOE;  Surgeon: FSamara Deist DPM;  Location: [facility];  Service: Podiatry;  Laterality: Left;  . ARTERIOVENOUS GRAFT PLACEMENT  05/2016  . AV FISTULA PLACEMENT Left 05/30/2016   Procedure: ARTERIOVENOUS graft;  Surgeon: JAlgernon Huxley MD;  Location: [facility] OR[facility];  Service: Vascular;  Laterality: Left;  . CAPD INSERTION N/A 10/30/2017   Procedure: LAPAROSCOPIC INSERTION CONTINUOUS AMBULATORY PERITONEAL DIALYSIS  (CAPD) CATHETER;  Surgeon: DAlgernon Huxley MD;  Location: [facility];  Service: Vascular;  Laterality: N/A;  . CARDIAC CATHETERIZATION  01/2015   [facility]: Ost LM 70%, p-m LAD 50-60% (Rest FFR + @ 0.75), mRCA 80-90%, ostD1 40%, pOM1 40%  . CATARACT EXTRACTION W/PHACO Left 01/18/2016   Procedure: CATARACT EXTRACTION PHACO AND INTRAOCULAR LENS PLACEMENT (IOC);  Surgeon: BEulogio Bear MD;  Location: [facility];  Service: Ophthalmology;  Laterality: Left;  [place]1.05 AP% 15.5 CDE 10.16 Fluid Pack Lot # [id number]  . CATARACT EXTRACTION W/PHACO Right 08/01/2016   Procedure: CATARACT EXTRACTION PHACO AND INTRAOCULAR LENS PLACEMENT (IOC);  Surgeon: BEulogio Bear MD;  Location: [facility];  Service: Ophthalmology;  Laterality: Right;  [place]01:00.6 AP% 11.4 CDE 6.93  LOT # [id number]  . COLONOSCOPY    . CORONARY ANGIOPLASTY     SENTS 02/12/16  . CORONARY ARTERY BYPASS GRAFT  03/28/15    [facility]: Dr. HWaldemar Dickens LIMA  to LAD, SVG to RCA, SVG to OM3, EVH  . DIALYSIS/PERMA CATHETER INSERTION N/A 02/11/2018   Procedure: DIALYSIS/PERMA CATHETER INSERTION;  Surgeon: Algernon Huxley, MD;  Location: [place] [facility];  Service: Cardiovascular;  Laterality: N/A;  . DIALYSIS/PERMA CATHETER REMOVAL Right 02/04/2018   Procedure: DIALYSIS/PERMA CATHETER REMOVAL;  Surgeon: Katha Cabal, MD;  Location: [place] [facility];  Service: Cardiovascular;  Laterality: Right;  . ESOPHAGOGASTRODUODENOSCOPY (EGD) WITH PROPOFOL N/A 10/24/2016   Procedure: ESOPHAGOGASTRODUODENOSCOPY (EGD) WITH PROPOFOL;  Surgeon: [name], MD;   Location: [facility];  Service: Endoscopy;  Laterality: N/A;  . EYE SURGERY Bilateral    cataract surgery  . EYE SURGERY     drains for glaucoma  . INSERTION EXPRESS TUBE SHUNT Right 08/01/2016   Procedure: INSERTION EXPRESS TUBE SHUNT;  Surgeon: Eulogio Bear, MD;  Location: [facility];  Service: Ophthalmology;  Laterality: Right;  . INSERTION OF AHMED VALVE Left 08/15/2016   Procedure: INSERTION OF AHMED VALVE;  Surgeon: Eulogio Bear, MD;  Location: [facility];  Service: Ophthalmology;  Laterality: Left;  . INSERTION OF DIALYSIS CATHETER    . LOWER EXTREMITY ANGIOGRAPHY Right 02/19/2018   Procedure: Lower Extremity Angiography;  Surgeon: Algernon Huxley, MD;  Location: [place] [facility];  Service: Cardiovascular;  Laterality: Right;  . LOWER EXTREMITY ANGIOGRAPHY Left 01/30/2018   Procedure: Lower Extremity Angiography;  Surgeon: Katha Cabal, MD;  Location: [place] [facility];  Service: Cardiovascular;  Laterality: Left;  . REMOVAL OF A DIALYSIS CATHETER N/A 02/25/2018   Procedure: REMOVAL OF A DIALYSIS CATHETER;  Surgeon: Katha Cabal, MD;  Location: [facility];  Service: Vascular;  Laterality: N/A;  . TEMPORARY DIALYSIS CATHETER N/A 02/06/2018   Procedure: TEMPORARY DIALYSIS CATHETER;  Surgeon: Katha Cabal, MD;  Location: [place] [facility];  Service: Cardiovascular;  Laterality: N/A;  . TRANSTHORACIC ECHOCARDIOGRAM  January 2017    EF 60-65%. GR 2 DD. Mild degenerative mitral valve disease but no prolapse or regurgitation. Mild left atrial dilation. Mild to moderate LVH. Pericardial effusion gone  . UPPER EXTREMITY ANGIOGRAPHY Left 06/27/2016   Procedure: Upper Extremity Angiography;  Surgeon: Algernon Huxley, MD;  Location: [place] [facility];  Service: Cardiovascular;  Laterality: Left;  . UPPER EXTREMITY ANGIOGRAPHY Left 08/22/2016   Procedure: Upper Extremity Angiography;  Surgeon: Algernon Huxley, MD;  Location: [place] [facility];  Service: Cardiovascular;   Laterality: Left;  Marland Kitchen VASCULAR SURGERY      Family History  Problem Relation Age of Onset  . Diabetes Mellitus II Mother   . Heart failure Mother   . Pancreatic cancer Father     Social History:  reports that she has never smoked. She has never used smokeless tobacco. She reports that she does not drink alcohol or use drugs.  Allergies  Allergen Reactions  . Chlorthalidone Anaphylaxis, Itching and Rash  . Fentanyl Rash  . Midazolam Rash  . Ace Inhibitors Other (See Comments)    Reaction:  Hyperkalemia, agitation   . Angiotensin Receptor Blockers Other (See Comments)    Reaction:  Hyperkalemia, agitation   . Norvasc [Amlodipine] Itching and Rash  . Phenergan [Promethazine Hcl] Anxiety    "antsy, can't sit still"    Medications: I have reviewed the patient's current medications.  ROS: Confusion unable to obtain   Physical Examination: Blood pressure (!) 116/50, pulse 64, temperature 97.6 F (36.4 C), temperature source Oral, resp. rate 18, height 5' 4"  (1.626 m), weight 54.7 kg,  SpO2 95 %.  Mental Status: Lethargic, oriented to person and place but not to time and situation.  Cranial Nerves: II: Discs flat bilaterally; Visual fields grossly normal, pupils equal, round, reactive to light and accommodation III,IV, VI: ptosis not present, extra-ocular motions intact bilaterally V,VII: smile symmetric, facial light touch sensationintact VIII: hearing normal bilaterally IX,X: gag reflex present XI: bilateral shoulder shrug XII: midline tongue extension Motor: Right :Upper extremity 5/5Left: Upper extremity 5/5  Right:BKALeft: BKA Tone and bulk:normal tone throughout; no atrophy noted Sensory: Pinprick and light touchintact bilaterally Deep Tendon Reflexes: 2+ and symmetric throughout Plantars: Bilateral BKA Cerebellar: Unable to assess Gait: Bilateral BKA  Laboratory Studies:   Basic Metabolic Panel: Recent Labs   Lab 03/14/18 2123 03/15/18 1236 03/18/18 2144 03/20/18 1258  NA 135 136 140 139  K 4.6 4.3 4.2 4.1  CL 94* 97* 96* 97*  CO2 27 28 30 29   GLUCOSE 207* 85 177* 144*  BUN 42* 22 30* 39*  CREATININE 7.18* 4.85* 6.38* 8.89*  CALCIUM 8.5* 8.2* 8.6* 8.6*  MG 2.1 2.0  --   --   PHOS 4.9* 3.3  --  4.2    Liver Function Tests: Recent Labs  Lab 03/20/18 1258  ALBUMIN 2.1*   No results for input(s): LIPASE, AMYLASE in the last 168 hours. No results for input(s): AMMONIA in the last 168 hours.  CBC: Recent Labs  Lab 03/14/18 2302 03/15/18 0652 03/17/18 0553 03/18/18 2144 03/20/18 1258  WBC 16.3* 12.9* 11.5* 10.5 9.8  NEUTROABS 13.8* 9.6* 8.5*  --   --   HGB 8.4* 8.6* 8.9* 8.3* 7.3*  HCT 28.3* 28.9* 30.7* 27.8* 25.4*  MCV 102.9* 102.8* 103.7* 101.5* 105.0*  PLT 357 354 390 345 335    Cardiac Enzymes: Recent Labs  Lab 03/14/18 2302  TROPONINI 0.21*    BNP: Invalid input(s): POCBNP  CBG: Recent Labs  Lab 03/17/18 1530 03/17/18 1730 03/17/18 2022 03/18/18 0738 03/18/18 1229  GLUCAP 103* 144* 158* 132* 193*    Microbiology: Results for orders placed or performed during the hospital encounter of 03/18/18  MRSA PCR Screening     Status: None   Collection Time: 03/20/18  6:33 AM  Result Value Ref Range Status   MRSA by PCR NEGATIVE NEGATIVE Final    Comment:        The GeneXpert MRSA Assay (FDA approved for NASAL specimens only), is one component of a comprehensive MRSA colonization surveillance program. It is not intended to diagnose MRSA infection nor to guide or monitor treatment for MRSA infections. Performed at [facility], [address] [place], [place] [address]     Coagulation Studies: No results for input(s): LABPROT, INR in the last 72 hours.  Urinalysis: No results for input(s): COLORURINE, LABSPEC, PHURINE, GLUCOSEU, HGBUR, BILIRUBINUR, KETONESUR, PROTEINUR, UROBILINOGEN, NITRITE, LEUKOCYTESUR in the last 168  hours.  Invalid input(s): APPERANCEUR  Lipid Panel:  No results found for: CHOL, TRIG, HDL, CHOLHDL, VLDL, LDLCALC  HgbA1C:  Lab Results  Component Value Date   HGBA1C 6.5 (H) 02/18/2018    Urine Drug Screen:  No results found for: LABOPIA, COCAINSCRNUR, LABBENZ, AMPHETMU, THCU, LABBARB  Alcohol Level: No results for input(s): ETH in the last 168 hours.  Other results: EKG: normal EKG, normal sinus rhythm, unchanged from previous tracings.  Imaging: No results found.   Assessment/Plan:  67 y.o. female female with a known history of severe peripheral vascular disease status post bilateral recent below knee amputation, chronic anemia, end-stage renal disease  on hemodialysis, CAD comes to the emergency room from [facility] after she had an episode of transient loss of consciousness.  Pt has been having these episodes on and off where she passes out and returns back in a couple minutes. She does not remember or does not have any warnings/signs symptoms prior to this episode.I personally have sen her for same episode few days ago.    No further events ovenight. S/p HD yesterday Con't increased Keppra dose and Keppra post HD D/c planning    03/21/2018, 2:27 PM

## 2018-03-21 NOTE — Progress Notes (Signed)
[facility]  ROUNDING NOTE   Subjective:   Patient did well after hemodialysis yesterday No further episodes of syncope States that she is eating some without nausea or vomiting   Objective:  Vital signs in last 24 hours:  Temp:  [98 F (36.7 C)-99.5 F (37.5 C)] 98.5 F (36.9 C) (11/30 0806) Pulse Rate:  [57-68] 62 (11/30 0806) Resp:  [12-19] 18 (11/30 0806) BP: (124-190)/(48-66) 143/54 (11/30 0806) SpO2:  [91 %-100 %] 96 % (11/30 0806) Weight:  [53.4 kg] 53.4 kg (11/30 0500)  Weight change: -0.59 kg Filed Weights   03/19/18 1151 03/20/18 0424 03/21/18 0500  Weight: 54 kg 53.9 kg 53.4 kg    Intake/Output: I/O last 3 completed shifts: In: 240 [P.O.:240] Out: 1011 [Other:1011]   Intake/Output this shift:  No intake/output data recorded.  Physical Exam: General: NAD, laying in the bed  Head: Normocephalic, atraumatic. Moist oral mucosal membranes  Eyes: Anicteric,   Neck: Supple,  Lungs:   Normal breathing effort, [place] O2  Heart: Regular rate and rhythm  Abdomen:  Soft, nontender,   Extremities:  bilateral BKA   Neurologic:  Alert, able to answer questions appropriately  Access:  Left IJ permcath    Basic Metabolic Panel: Recent Labs  Lab 03/14/18 2123 03/15/18 1236 03/18/18 2144 03/20/18 1258  NA 135 136 140 139  K 4.6 4.3 4.2 4.1  CL 94* 97* 96* 97*  CO2 27 28 30 29   GLUCOSE 207* 85 177* 144*  BUN 42* 22 30* 39*  CREATININE 7.18* 4.85* 6.38* 8.89*  CALCIUM 8.5* 8.2* 8.6* 8.6*  MG 2.1 2.0  --   --   PHOS 4.9* 3.3  --  4.2    Liver Function Tests: Recent Labs  Lab 03/20/18 1258  ALBUMIN 2.1*   No results for input(s): LIPASE, AMYLASE in the last 168 hours. No results for input(s): AMMONIA in the last 168 hours.  CBC: Recent Labs  Lab 03/14/18 2302 03/15/18 0652 03/17/18 0553 03/18/18 2144 03/20/18 1258  WBC 16.3* 12.9* 11.5* 10.5 9.8  NEUTROABS 13.8* 9.6* 8.5*  --   --   HGB 8.4* 8.6* 8.9* 8.3* 7.3*  HCT 28.3* 28.9*  30.7* 27.8* 25.4*  MCV 102.9* 102.8* 103.7* 101.5* 105.0*  PLT 357 354 390 345 335    Cardiac Enzymes: Recent Labs  Lab 03/14/18 2302  TROPONINI 0.21*    BNP: Invalid input(s): POCBNP  CBG: Recent Labs  Lab 03/17/18 1530 03/17/18 1730 03/17/18 2022 03/18/18 0738 03/18/18 1229  GLUCAP 103* 144* 158* 132* 193*    Microbiology: Results for orders placed or performed during the hospital encounter of 03/18/18  MRSA PCR Screening     Status: None   Collection Time: 03/20/18  6:33 AM  Result Value Ref Range Status   MRSA by PCR NEGATIVE NEGATIVE Final    Comment:        The GeneXpert MRSA Assay (FDA approved for NASAL specimens only), is one component of a comprehensive MRSA colonization surveillance program. It is not intended to diagnose MRSA infection nor to guide or monitor treatment for MRSA infections. Performed at [facility], [address] [place], [place] [address]     Coagulation Studies: No results for input(s): LABPROT, INR in the last 72 hours.  Urinalysis: No results for input(s): COLORURINE, LABSPEC, PHURINE, GLUCOSEU, HGBUR, BILIRUBINUR, KETONESUR, PROTEINUR, UROBILINOGEN, NITRITE, LEUKOCYTESUR in the last 72 hours.  Invalid input(s): APPERANCEUR    Imaging: No results found.   Medications:    .  apixaban  2.5 mg Oral BID  . aspirin EC  81 mg Oral Daily  . atorvastatin  80 mg Oral QHS  . brimonidine  1 drop Both Eyes TID  . calcitRIOL  0.5 mcg Oral Daily  . carvedilol  6.25 mg Oral BID  . Chlorhexidine Gluconate Cloth  6 each Topical Q0600  . docusate sodium  100 mg Oral BID  . famotidine  20 mg Oral Daily  . folic acid  1 mg Oral Daily  . isosorbide mononitrate  30 mg Oral Daily  . levETIRAcetam  250 mg Oral Once per day on Mon Wed Fri  . levETIRAcetam  500 mg Oral BID  . midodrine  10 mg Oral Once per day on Mon Wed Fri  . mirtazapine  7.5 mg Oral QHS  . multivitamin  1 tablet Oral Daily  . omega-3 acid ethyl esters   2 g Oral Daily  . polyvinyl alcohol  1 drop Both Eyes BID  . sevelamer carbonate  800 mg Oral TID WC  . timolol  1 drop Both Eyes TID  . vitamin B-12  500 mcg Oral Daily   acetaminophen **OR** acetaminophen, ondansetron **OR** ondansetron (ZOFRAN) IV, traMADol  Assessment/ Plan:  Stephanie Ellis is a 67 y.o. 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  [facility] TTS hemodialysis IJ permcath  1. End Stage Renal Disease  Plan hemodialysis today to get her back on normal dialysis schedule  2. 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 Lucky Cowboy.  Echocardiogram on November 24 showed LVEF 40 to 45%, mild concentric LVH, diffuse hypokinesis, grade 3 diastolic dysfunction  3. Anemia of chronic kidney disease:  Hemoglobin 7.3 - Decreasing hemoglobin trend - EPO with hemodialysis treatment TTS - Multivitamin daily  4. Secondary Hyperparathyroidism: Sevelamer with meals  5. Syncope: concern for seizures vs Cardiogenic. Loss of consciousness episodes.  Neurology evaluation ongoing Keppra dose has been adjusted Keep BFR < 350 during HD    LOS: 2 Stephanie Ellis 11/30/201912:45 PM

## 2018-03-21 NOTE — Progress Notes (Signed)
[facility] [place] NAME: Stephanie Ellis    MR#:  [id number]  DATE OF BIRTH:  [birth year]  SUBJECTIVE:  CHIEF COMPLAINT: No other episodes of transient loss of consciousness noticed.no new complaints Patient had hemodialysis yesterday   REVIEW OF SYSTEMS:  CONSTITUTIONAL: No fever, fatigue.  Reporting weakness EYES: No blurred or double vision.  EARS, NOSE, AND THROAT: No tinnitus or ear pain.  RESPIRATORY: No cough, shortness of breath, wheezing or hemoptysis.  CARDIOVASCULAR: No chest pain, orthopnea, edema.  GASTROINTESTINAL: No nausea, vomiting, diarrhea or abdominal pain.  GENITOURINARY: No dysuria, hematuria.  ENDOCRINE: No polyuria, nocturia,  HEMATOLOGY: No anemia, easy bruising or bleeding SKIN: No rash or lesion. MUSCULOSKELETAL: Reporting leg pain NEUROLOGIC: No tingling, numbness, weakness.  PSYCHIATRY: No anxiety or depression.   DRUG ALLERGIES:   Allergies  Allergen Reactions  . Chlorthalidone Anaphylaxis, Itching and Rash  . Fentanyl Rash  . Midazolam Rash  . Ace Inhibitors Other (See Comments)    Reaction:  Hyperkalemia, agitation   . Angiotensin Receptor Blockers Other (See Comments)    Reaction:  Hyperkalemia, agitation   . Norvasc [Amlodipine] Itching and Rash  . Phenergan [Promethazine Hcl] Anxiety    "antsy, can't sit still"    VITALS:  Blood pressure (!) 115/53, pulse 62, temperature 97.6 F (36.4 C), temperature source Oral, resp. rate 18, height 5' 4"  (1.626 m), weight 54.7 kg, SpO2 96 %.  PHYSICAL EXAMINATION:  GENERAL:  67 y.o.-year-old patient lying in the bed with no acute distress.  EYES: Pupils equal, round, reactive to light and accommodation. No scleral icterus. Extraocular muscles intact.  HEENT: Head atraumatic, normocephalic. Oropharynx and nasopharynx clear.  NECK:  Supple, no jugular venous distention. No thyroid enlargement, no tenderness.  LUNGS: Normal breath sounds bilaterally,  no wheezing, rales,rhonchi or crepitation. No use of accessory muscles of respiration.  CARDIOVASCULAR: S1, S2 normal. No murmurs, rubs, or gallops.  ABDOMEN: Soft, nontender, nondistended. Bowel sounds present. No organomegaly or mass.  EXTREMITIES: Bilateral BKA  nEUROLOGIC: Cranial nerves II through XII are intact. Muscle strength 5/5 in all extremities. Sensation intact. Gait not checked.  PSYCHIATRIC: The patient is alert and oriented x 3.  SKIN: No obvious rash, lesion, or ulcer.    LABORATORY PANEL:   CBC Recent Labs  Lab 03/20/18 1258  WBC 9.8  HGB 7.3*  HCT 25.4*  PLT 335   ------------------------------------------------------------------------------------------------------------------  Chemistries  Recent Labs  Lab 03/15/18 1236  03/20/18 1258  NA 136   < > 139  K 4.3   < > 4.1  CL 97*   < > 97*  CO2 28   < > 29  GLUCOSE 85   < > 144*  BUN 22   < > 39*  CREATININE 4.85*   < > 8.89*  CALCIUM 8.2*   < > 8.6*  MG 2.0  --   --    < > = values in this interval not displayed.   ------------------------------------------------------------------------------------------------------------------  Cardiac Enzymes Recent Labs  Lab 03/14/18 2302  TROPONINI 0.21*   ------------------------------------------------------------------------------------------------------------------  RADIOLOGY:  No results found.  EKG:   Orders placed or performed during the hospital encounter of 03/18/18  . ED EKG  . ED EKG    ASSESSMENT AND PLAN:   Stephanie Ellis  is a 67 y.o. female with a known history of severe peripheral vascular disease status post bilateral recent below knee amputation, chronic anemia, end-stage renal disease on hemodialysis, CAD comes to  the emergency room from [facility] after she had an episode of transient loss of consciousness.   1. syncope-- recurrent, etiology unclear could be seizure related -patient has had workup with recent two CT scans  which were negative, MRI essentially nothing acute, neurology started patient empirically on Keppra -neurology consultation with Dr. Irish Elders. Discussed with him -patient also had EEG did not show any epileptiform activity -EKG today shows prolonged QT interval... Previous EKG is reviewed acute intervals were within range -seen by neurology who is recommending prolonged EEG course and agreeable with Keppra 500 mg twice daily and Keppra post hemodialysis -Neurochecks  2. ESR D on dialysis -nephrology following -Had hemodialysis today and is scheduled for today  3. Peripheral vascular disease with recent bilateral below knee amputation -continue eliquis  4.  Anemia of chronic kidney disease. Hemoglobin at 7.3  Epogen with hemodialysis treatment  If necessary we will do blood transfusion   5. Discharge planning. Social worker consultation for discharge planning. Per daughter [facility] has declined to take patient back given her complex medical issues  6. DVT prophylaxis on eliquis  Disposition back to [facility] when medically stable     All the records are reviewed and case discussed with Care Management/Social Workerr. Management plans discussed with the patient, family and they are in agreement.  CODE STATUS: DNR  TOTAL TIME TAKING CARE OF THIS PATIENT: 35 minutes.   POSSIBLE D/C IN 2 DAYS, DEPENDING ON CLINICAL CONDITION.  Note: This dictation was prepared with Dragon dictation along with smaller phrase technology. Any transcriptional errors that result from this process are unintentional.   [name] M.D on 03/21/2018 at 3:50 PM  Between 7am to 6pm - Pager - [phone] After 6pm go to [email or web address] - password [email or web address] [facility]  Office  [phone]  CC: Primary care physician; Patient, No Pcp Per

## 2018-03-22 ENCOUNTER — Other Ambulatory Visit: Payer: Self-pay

## 2018-03-22 NOTE — Progress Notes (Signed)
Stephanie Ellis at [facility] NAME: Stephanie Ellis    MR#:  [id number]  DATE OF BIRTH:  May 12, 1950  SUBJECTIVE:  CHIEF COMPLAINT: No other episodes of transient loss of consciousness noticed.intermittent episodes of confusion noticed patient could not recall today's date .patient is refusing to go back to [facility].  Patients sister has witnessed seizures while patient was at [facility]  patient had hemodialysis yesterday.  Sister at bedside  REVIEW OF SYSTEMS:  CONSTITUTIONAL: No fever, fatigue.  Reporting weakness EYES: No blurred or double vision.  EARS, NOSE, AND THROAT: No tinnitus or ear pain.  RESPIRATORY: No cough, shortness of breath, wheezing or hemoptysis.  CARDIOVASCULAR: No chest pain, orthopnea, edema.  GASTROINTESTINAL: No nausea, vomiting, diarrhea or abdominal pain.  GENITOURINARY: No dysuria, hematuria.  ENDOCRINE: No polyuria, nocturia,  HEMATOLOGY: No anemia, easy bruising or bleeding SKIN: No rash or lesion. MUSCULOSKELETAL: Reporting leg pain NEUROLOGIC: No tingling, numbness, weakness.  PSYCHIATRY: No anxiety or depression.   DRUG ALLERGIES:   Allergies  Allergen Reactions  . Chlorthalidone Anaphylaxis, Itching and Rash  . Fentanyl Rash  . Midazolam Rash  . Ace Inhibitors Other (See Comments)    Reaction:  Hyperkalemia, agitation   . Angiotensin Receptor Blockers Other (See Comments)    Reaction:  Hyperkalemia, agitation   . Norvasc [Amlodipine] Itching and Rash  . Phenergan [Promethazine Hcl] Anxiety    "antsy, can't sit still"    VITALS:  Blood pressure (!) 139/49, pulse 69, temperature 99.1 F (37.3 C), temperature source Oral, resp. rate 20, height _0  (1.626 m), weight 52.7 kg, SpO2 93 %.  PHYSICAL EXAMINATION:  GENERAL:  67 y.o.-year-old patient lying in the bed with no acute distress.  EYES: Pupils equal, round, reactive to light and accommodation. No scleral icterus. Extraocular  muscles intact.  HEENT: Head atraumatic, normocephalic. Oropharynx and nasopharynx clear.  NECK:  Supple, no jugular venous distention. No thyroid enlargement, no tenderness.  LUNGS: Normal breath sounds bilaterally, no wheezing, rales,rhonchi or crepitation. No use of accessory muscles of respiration.  CARDIOVASCULAR: S1, S2 normal. No murmurs, rubs, or gallops.  ABDOMEN: Soft, nontender, nondistended. Bowel sounds present. No organomegaly or mass.  EXTREMITIES: Bilateral BKA  nEUROLOGIC: Cranial nerves II through XII are intact. Muscle strength 5/5 in all extremities. Sensation intact. Gait not checked.  PSYCHIATRIC: The patient is alert and oriented x 3.  SKIN: No obvious rash, lesion, or ulcer.    LABORATORY PANEL:   CBC Recent Labs  Lab 03/20/18 1258  WBC 9.8  HGB 7.3*  HCT 25.4*  PLT 335   ------------------------------------------------------------------------------------------------------------------  Chemistries  Recent Labs  Lab 03/20/18 1258  NA 139  K 4.1  CL 97*  CO2 29  GLUCOSE 144*  BUN 39*  CREATININE 8.89*  CALCIUM 8.6*   ------------------------------------------------------------------------------------------------------------------  Cardiac Enzymes No results for input(s): TROPONINI in the last 168 hours. ------------------------------------------------------------------------------------------------------------------  RADIOLOGY:  No results found.  EKG:   Orders placed or performed during the hospital encounter of 03/18/18  . ED EKG  . ED EKG    ASSESSMENT AND PLAN:   Stephanie Ellis  is a 67 y.o. female with a known history of severe peripheral vascular disease status post bilateral recent below knee amputation, chronic anemia, end-stage renal disease on hemodialysis, CAD comes to the emergency room from [facility] after she had an episode of transient loss of consciousness.   1. syncope-- recurrent, etiology unclear could be  seizure related -patient has had  workup with recent two CT scans which were negative, MRI essentially nothing acute, neurology started patient empirically on Keppra -neurology consultation with Dr. Irish Elders. Discussed with him -patient also had EEG did not show any epileptiform activity -EKG today shows prolonged QT interval... Previous EKG is reviewed acute intervals were within range -seen by neurology who is recommending prolonged EEG course and agreeable with Keppra 500 mg twice daily and Keppra post hemodialysis -Neurochecks -Intermittent episodes of confusion discontinue Remeron  2. ESR D on dialysis -nephrology following -Had hemodialysis 11/29, 11/30   3. Peripheral vascular disease with recent bilateral below knee amputation -continue eliquis  4.  Anemia of chronic kidney disease. Hemoglobin at 7.3  Check a.m. labs Epogen with hemodialysis treatment  If necessary we will do blood transfusion   5. Discharge planning. Social worker consultation for discharge planning. Per daughter [facility] has declined to take patient back given her complex medical issues  6. DVT prophylaxis on eliquis  Disposition back to [facility] when medically stable but patient is refusing to go back to [facility] Sister at bedside.  Call placed to patient's healthcare power of attorney Stephanie Ellis (daughter) #[phone]     All the records are reviewed and case discussed with Care Management/Social Workerr. Management plans discussed with the patient, family and they are in agreement.  CODE STATUS: DNR  TOTAL TIME TAKING CARE OF THIS PATIENT: 35 minutes.   POSSIBLE D/C IN 2 DAYS, DEPENDING ON CLINICAL CONDITION.  Note: This dictation was prepared with Dragon dictation along with smaller phrase technology. Any transcriptional errors that result from this process are unintentional.   Stephanie Ellis M.D on 03/22/2018 at 4:09 PM  Between 7am to 6pm - Pager - [phone] After 6pm  go to [email or web address] - password [email or web address] [facility]  Office  [phone]  CC: Primary care physician; Patient, No Pcp Per

## 2018-03-22 NOTE — Plan of Care (Signed)
Patient is confused and drowsy. Patient is having difficulty finding the right words to complete her sentence. Patient is disoriented to place. Family is at the bedside and made staff aware about the confusion. Patient complained of bilateral leg pain, administered tramadol. Patient is resting in bed.

## 2018-03-22 NOTE — Clinical Social Work Note (Signed)
The CSW contacted [facility] to inquire about possible discharge today. The facility reported that Dr. Ouida Sills, the patient's cardiologist, reported to them that the patient would need telemetry through the weekend. The CSW has updated the attending MD. The CSW is following.  Santiago Bumpers, MSW, [facility] [phone]

## 2018-03-22 NOTE — Progress Notes (Signed)
[facility]  ROUNDING NOTE   Subjective:   Patient did well after hemodialysis yesterday No further episodes of syncope but according to nursing notes, patient got confused again last night This morning she states she does not remember anything Denies nausea or vomiting   Objective:  Vital signs in last 24 hours:  Temp:  [97.6 F (36.4 C)-99.1 F (37.3 C)] 99.1 F (37.3 C) (12/01 0742) Pulse Rate:  [60-71] 69 (12/01 0742) Resp:  [12-24] 20 (12/01 0742) BP: (86-154)/(39-55) 139/49 (12/01 0742) SpO2:  [90 %-100 %] 93 % (12/01 0742) Weight:  [52.7 kg-54.7 kg] 52.7 kg (12/01 0156)  Weight change: 1.312 kg Filed Weights   03/21/18 1320 03/21/18 1733 03/22/18 0156  Weight: 54.7 kg 53.7 kg 52.7 kg    Intake/Output: I/O last 3 completed shifts: In: 60 [P.O.:60] Out: -48    Intake/Output this shift:  No intake/output data recorded.  Physical Exam: General: NAD, laying in the bed  Head: Normocephalic, atraumatic. Moist oral mucosal membranes  Eyes: Anicteric,   Neck: Supple,  Lungs:   Normal breathing effort, [place] O2  Heart: Regular rate and rhythm  Abdomen:  Soft, nontender,   Extremities:  bilateral BKA   Neurologic:  Alert, able to answer questions appropriately  Access:  Left IJ permcath    Basic Metabolic Panel: Recent Labs  Lab 03/15/18 1236 03/18/18 2144 03/20/18 1258  NA 136 140 139  K 4.3 4.2 4.1  CL 97* 96* 97*  CO2 28 30 29   GLUCOSE 85 177* 144*  BUN 22 30* 39*  CREATININE 4.85* 6.38* 8.89*  CALCIUM 8.2* 8.6* 8.6*  MG 2.0  --   --   PHOS 3.3  --  4.2    Liver Function Tests: Recent Labs  Lab 03/20/18 1258  ALBUMIN 2.1*   No results for input(s): LIPASE, AMYLASE in the last 168 hours. No results for input(s): AMMONIA in the last 168 hours.  CBC: Recent Labs  Lab 03/17/18 0553 03/18/18 2144 03/20/18 1258  WBC 11.5* 10.5 9.8  NEUTROABS 8.5*  --   --   HGB 8.9* 8.3* 7.3*  HCT 30.7* 27.8* 25.4*  MCV 103.7* 101.5* 105.0*   PLT 390 345 335    Cardiac Enzymes: No results for input(s): CKTOTAL, CKMB, CKMBINDEX, TROPONINI in the last 168 hours.  BNP: Invalid input(s): POCBNP  CBG: Recent Labs  Lab 03/17/18 1530 03/17/18 1730 03/17/18 2022 03/18/18 0738 03/18/18 1229  GLUCAP 103* 144* 158* 132* 193*    Microbiology: Results for orders placed or performed during the hospital encounter of 03/18/18  MRSA PCR Screening     Status: None   Collection Time: 03/20/18  6:33 AM  Result Value Ref Range Status   MRSA by PCR NEGATIVE NEGATIVE Final    Comment:        The GeneXpert MRSA Assay (FDA approved for NASAL specimens only), is one component of a comprehensive MRSA colonization surveillance program. It is not intended to diagnose MRSA infection nor to guide or monitor treatment for MRSA infections. Performed at [facility], [address] [place],  [address]     Coagulation Studies: No results for input(s): LABPROT, INR in the last 72 hours.  Urinalysis: No results for input(s): COLORURINE, LABSPEC, PHURINE, GLUCOSEU, HGBUR, BILIRUBINUR, KETONESUR, PROTEINUR, UROBILINOGEN, NITRITE, LEUKOCYTESUR in the last 72 hours.  Invalid input(s): APPERANCEUR    Imaging: No results found.   Medications:    . apixaban  2.5 mg Oral BID  . aspirin EC  81 mg Oral Daily  . atorvastatin  80 mg Oral QHS  . brimonidine  1 drop Both Eyes TID  . calcitRIOL  0.5 mcg Oral Daily  . carvedilol  6.25 mg Oral BID  . Chlorhexidine Gluconate Cloth  6 each Topical Q0600  . docusate sodium  100 mg Oral BID  . [START ON 03/24/2018] epoetin (EPOGEN/PROCRIT) injection  10,000 Units Intravenous Q T,Th,Sa-HD  . famotidine  20 mg Oral Daily  . folic acid  1 mg Oral Daily  . isosorbide mononitrate  30 mg Oral Daily  . levETIRAcetam  250 mg Oral Once per day on Mon Wed Fri  . levETIRAcetam  500 mg Oral BID  . midodrine  10 mg Oral Once per day on Mon Wed Fri  . mirtazapine  7.5 mg Oral QHS  .  multivitamin  1 tablet Oral Daily  . omega-3 acid ethyl esters  2 g Oral Daily  . polyvinyl alcohol  1 drop Both Eyes BID  . sevelamer carbonate  800 mg Oral TID WC  . sodium chloride flush  3 mL Intravenous Q12H  . timolol  1 drop Both Eyes TID  . vitamin B-12  500 mcg Oral Daily   acetaminophen **OR** acetaminophen, ondansetron **OR** ondansetron (ZOFRAN) IV, traMADol  Assessment/ Plan:  [name] is a 67 y.o. 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  [facility] [place] TTS hemodialysis IJ permcath  1. End Stage Renal Disease  Next hemodialysis planned for Saturday  2. 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 Lucky Cowboy.  Echocardiogram on November 24 showed LVEF 40 to 45%, mild concentric LVH, diffuse hypokinesis, grade 3 diastolic dysfunction  3. Anemia of chronic kidney disease:  Hemoglobin 7.3 - Decreasing hemoglobin trend is noted - EPO with hemodialysis treatment TTS - Multivitamin daily  4. Secondary Hyperparathyroidism: Sevelamer with meals  5. Syncope: concern for seizures vs Cardiogenic. Loss of consciousness episodes.  Neurology evaluation ongoing Keppra dose has been adjusted Keep BFR < 350 during HD Discontinue Remeron ?  Possibly contributing to confusion at night   LOS: 3 Stephanie Ellis 12/1/201911:58 AM

## 2018-03-22 NOTE — Plan of Care (Signed)
Large BM today

## 2018-03-23 LAB — BASIC METABOLIC PANEL
Anion gap: 11 (ref 5–15)
BUN: 22 mg/dL (ref 8–23)
CO2: 31 mmol/L (ref 22–32)
Calcium: 9.2 mg/dL (ref 8.9–10.3)
Chloride: 97 mmol/L — ABNORMAL LOW (ref 98–111)
Creatinine, Ser: 5.29 mg/dL — ABNORMAL HIGH (ref 0.44–1.00)
GFR calc Af Amer: 9 mL/min — ABNORMAL LOW (ref >60)
GFR calc non Af Amer: 8 mL/min — ABNORMAL LOW (ref >60)
Glucose, Bld: 137 mg/dL — ABNORMAL HIGH (ref 70–99)
Potassium: 4.1 mmol/L (ref 3.5–5.1)
Sodium: 139 mmol/L (ref 135–145)

## 2018-03-23 LAB — CBC
HCT: 26.2 % — ABNORMAL LOW (ref 36.0–46.0)
Hemoglobin: 7.5 g/dL — ABNORMAL LOW (ref 12.0–15.0)
MCH: 29.4 pg (ref 26.0–34.0)
MCHC: 28.6 g/dL — ABNORMAL LOW (ref 30.0–36.0)
MCV: 102.7 fL — ABNORMAL HIGH (ref 80.0–100.0)
Platelets: 312 10*3/uL (ref 150–400)
RBC: 2.55 MIL/uL — ABNORMAL LOW (ref 3.87–5.11)
RDW: 19.7 % — ABNORMAL HIGH (ref 11.5–15.5)
WBC: 8.7 10*3/uL (ref 4.0–10.5)
nRBC: 0.2 % (ref 0.0–0.2)

## 2018-03-23 MED ORDER — LEVETIRACETAM 500 MG PO TABS: 500.0000 mg | ORAL_TABLET | Freq: Two times a day (BID) | ORAL | Status: DC

## 2018-03-23 MED ORDER — LEVETIRACETAM 250 MG PO TABS: 250.0000 mg | ORAL_TABLET | ORAL | Status: DC

## 2018-03-23 MED ORDER — TRAMADOL HCL 50 MG PO TABS: 50.0000 mg | ORAL_TABLET | Freq: Three times a day (TID) | ORAL | 0 refills | Status: DC | PRN

## 2018-03-23 MED ORDER — ACETAMINOPHEN 325 MG PO TABS: 650.0000 mg | ORAL_TABLET | Freq: Four times a day (QID) | ORAL | Status: DC | PRN

## 2018-03-23 MED ORDER — EPOETIN ALFA 10000 UNIT/ML IJ SOLN: 10000.0000 [IU] | INTRAMUSCULAR | Status: DC

## 2018-03-23 MED ORDER — OMEGA-3-ACID ETHYL ESTERS 1 G PO CAPS: 2.0000 g | ORAL_CAPSULE | Freq: Every day | ORAL | Status: DC

## 2018-03-23 NOTE — Progress Notes (Signed)
[facility]  ROUNDING NOTE   Subjective:  Patient resting comfortably in bed. Due for dialysis again tomorrow as an outpatient.    Objective:  Vital signs in last 24 hours:  Temp:  [98.5 F (36.9 C)-100 F (37.8 C)] 98.9 F (37.2 C) (12/02 0757) Pulse Rate:  [66-69] 66 (12/02 0757) Resp:  [18-20] 18 (12/02 0535) BP: (117-169)/(36-72) 158/52 (12/02 0757) SpO2:  [87 %-95 %] 91 % (12/02 0757)  Weight change:  Filed Weights   03/21/18 1320 03/21/18 1733 03/22/18 0156  Weight: 54.7 kg 53.7 kg 52.7 kg    Intake/Output: I/O last 3 completed shifts: In: 42 [P.O.:60] Out: 0    Intake/Output this shift:  No intake/output data recorded.  Physical Exam: General: NAD, laying in the bed  Head: Normocephalic, atraumatic. Moist oral mucosal membranes  Eyes: Anicteric  Neck: Supple  Lungs:  Normal breathing effort, CTAB  Heart: Regular rate and rhythm  Abdomen:  Soft, nontender  Extremities: bilateral BKA   Neurologic: Alert, able to answer questions appropriately  Access: Left IJ permcath    Basic Metabolic Panel: Recent Labs  Lab 03/18/18 2144 03/20/18 1258 03/23/18 0412  NA 140 139 139  K 4.2 4.1 4.1  CL 96* 97* 97*  CO2 30 29 31   GLUCOSE 177* 144* 137*  BUN 30* 39* 22  CREATININE 6.38* 8.89* 5.29*  CALCIUM 8.6* 8.6* 9.2  PHOS  --  4.2  --     Liver Function Tests: Recent Labs  Lab 03/20/18 1258  ALBUMIN 2.1*   No results for input(s): LIPASE, AMYLASE in the last 168 hours. No results for input(s): AMMONIA in the last 168 hours.  CBC: Recent Labs  Lab 03/17/18 0553 03/18/18 2144 03/20/18 1258 03/23/18 0412  WBC 11.5* 10.5 9.8 8.7  NEUTROABS 8.5*  --   --   --   HGB 8.9* 8.3* 7.3* 7.5*  HCT 30.7* 27.8* 25.4* 26.2*  MCV 103.7* 101.5* 105.0* 102.7*  PLT 390 345 335 312    Cardiac Enzymes: No results for input(s): CKTOTAL, CKMB, CKMBINDEX, TROPONINI in the last 168 hours.  BNP: Invalid input(s): POCBNP  CBG: Recent Labs  Lab  03/17/18 1530 03/17/18 1730 03/17/18 2022 03/18/18 0738 03/18/18 1229  GLUCAP 103* 144* 158* 132* 193*    Microbiology: Results for orders placed or performed during the hospital encounter of 03/18/18  MRSA PCR Screening     Status: None   Collection Time: 03/20/18  6:33 AM  Result Value Ref Range Status   MRSA by PCR NEGATIVE NEGATIVE Final    Comment:        The GeneXpert MRSA Assay (FDA approved for NASAL specimens only), is one component of a comprehensive MRSA colonization surveillance program. It is not intended to diagnose MRSA infection nor to guide or monitor treatment for MRSA infections. Performed at [facility], [address] [place], [place] [address]     Coagulation Studies: No results for input(s): LABPROT, INR in the last 72 hours.  Urinalysis: No results for input(s): COLORURINE, LABSPEC, PHURINE, GLUCOSEU, HGBUR, BILIRUBINUR, KETONESUR, PROTEINUR, UROBILINOGEN, NITRITE, LEUKOCYTESUR in the last 72 hours.  Invalid input(s): APPERANCEUR    Imaging: No results found.   Medications:    . apixaban  2.5 mg Oral BID  . aspirin EC  81 mg Oral Daily  . atorvastatin  80 mg Oral QHS  . brimonidine  1 drop Both Eyes TID  . calcitRIOL  0.5 mcg Oral Daily  . carvedilol  6.25 mg  Oral BID  . Chlorhexidine Gluconate Cloth  6 each Topical Q0600  . docusate sodium  100 mg Oral BID  . [START ON 03/24/2018] epoetin (EPOGEN/PROCRIT) injection  10,000 Units Intravenous Q T,Th,Sa-HD  . famotidine  20 mg Oral Daily  . folic acid  1 mg Oral Daily  . isosorbide mononitrate  30 mg Oral Daily  . levETIRAcetam  250 mg Oral Once per day on Mon Wed Fri  . levETIRAcetam  500 mg Oral BID  . midodrine  10 mg Oral Once per day on Mon Wed Fri  . multivitamin  1 tablet Oral Daily  . omega-3 acid ethyl esters  2 g Oral Daily  . polyvinyl alcohol  1 drop Both Eyes BID  . sevelamer carbonate  800 mg Oral TID WC  . sodium chloride flush  3 mL Intravenous Q12H  .  timolol  1 drop Both Eyes TID  . vitamin B-12  500 mcg Oral Daily   acetaminophen **OR** acetaminophen, ondansetron **OR** ondansetron (ZOFRAN) IV, traMADol  Assessment/ Plan:  [name] is a 67 y.o. 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  [facility] [place] TTS hemodialysis IJ permcath  1. End Stage Renal Disease  No acute indication for dialysis today.  Patient have dialysis tomorrow as an outpatient.  2. 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 Lucky Cowboy.  Echocardiogram on November 24 showed LVEF 40 to 45%, mild concentric LVH, diffuse hypokinesis, grade 3 diastolic dysfunction  3. Anemia of chronic kidney disease: hemoglobin currently 7.5.  She will need continued her to point stimulating agents as an outpatient.  4. Secondary Hyperparathyroidism: Sevelamer with meals, continue to monitor serum phosphorus as an outpatient.  5. Syncope: concern for seizures vs Cardiogenic. Loss of consciousness episodes.  Neurology evaluation performed.  Keppra dose has been adjusted Keep BFR < 350 during HD Off remeron at this time.    LOS: 4 Stephanie Ellis 12/2/20191:53 PM

## 2018-03-23 NOTE — Progress Notes (Signed)
Patient discharging to SNF. Report called to Levada Dy, Therapist, sports at [facility]. Family at bedside with patient. Copy of AVS given to patient/daughter. EMS called for transport. IV and telemetry to be removed prior to transport. No questions or complaints from patient/family at this time. Facility packet prepared by Randall Hiss, [place].

## 2018-03-23 NOTE — Clinical Social Work Note (Signed)
Patient to be d/c'ed today to [facility] room 507.  Patient and family agreeable to plans will transport via ems RN to call report [phone].  CSW spoke to patient's daughter [name], [phone] and she is aware that patient will be discharging to [facility] today.  Evette Cristal, MSW, [place]

## 2018-03-23 NOTE — Discharge Instructions (Signed)
Follow-up with primary care physician at [facility] in 2 to 3 days Continue hemodialysis at [facility] nephrology [facility]-TTS Follow-up with [facility] nephrology in 5 to 7 days  Vascular Surgery Wound Care Instructions Please change amputation dressing daily or sooner if needed. No Xeroform to staple line.  Cover with Kerlix first then cover with Coban.  OK  to leave open to air between dressing changes or when patient is not working with PT / active.

## 2018-03-23 NOTE — Discharge Summary (Signed)
[facility] NAME: Reanna Scoggin    MR#:  [id number]  DATE OF BIRTH:  Aug 28, 1950  DATE OF ADMISSION:  03/18/2018 ADMITTING PHYSICIAN: Fritzi Mandes, MD  DATE OF DISCHARGE:  03/23/18 PRIMARY CARE PHYSICIAN: Patient, No Pcp Per    ADMISSION DIAGNOSIS:  Decreased responsiveness [R41.89] Living accommodation issues [Z59.8] Syncope, unspecified syncope type [R55]  DISCHARGE DIAGNOSIS:  Active Problems:   Transient loss of consciousness End-stage renal disease  SECONDARY DIAGNOSIS:   Past Medical History:  Diagnosis Date  . (HFpEF) heart failure with preserved ejection fraction ([place])    a. 01/2018 Echo: EF 50-55%, no rwma, Gr1 DD, triv AI, mild MR. Midly dil LA/RV, mod reduced RV fxn. Irreg thickening of TV w/ mobile echodensity that appears to arise from valve.  . Anemia   . Anorexia   . Bacteremia due to Pseudomonas   . Carotid arterial disease ([place])    a. 01/2017 Carotid U/S: RICA <76, LICA <72.  Marland Kitchen Complication of anesthesia    a. Pt reports h/o complication on 9 different occasions - ? hypotension/arrest.  . 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 01/2015 Cath [facility]): 70% LM, p-mLAD 50-60% (Resting FFR 0.75), mRCA 80-90%, ~40 Ost OM & D1-->CABG; b. 01/2016 Staged PCI of LCX x 2 and RCA.  Marland Kitchen ESRD (end stage renal disease) on dialysis ([place])    a. ESRD secondary to acute kidney failure s/p CABG-->PD  . Essential hypertension   . GERD (gastroesophageal reflux disease)   . Heart murmur   . Hypercholesterolemia   . Myocardial infarction ([address]) 2016  . PVD (peripheral vascular disease) ([place])    a. 01/30/2018 PV Angio: Sev Left Popliteal dzs s/p PTA w/ 49m lutonix DEB w/ mech aspiration of the L popliteal, L AT, and Left tibioperoneal trunck and peroneal artery.  . 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  [facility]: Dr. BMarland KitchenHaithcock: CABG x 3, LIMA to LAD, SVG to RCA, SVG to OM3, EVH  . Sinusitis 2019   . Type II diabetes mellitus with complication ([facility]    CAD    HOSPITAL COURSE:    DeniseVinsonis [id number] a known history of severe peripheral vascular disease status post bilateral recent below knee amputation, chronic anemia, end-stage renal disease on hemodialysis, CAD comes to the emergency room from [facility] after she had an episode of transient loss of consciousness.   1.syncope--recurrent, seizure related -patient has had workup with recent two CT scans which were negative, MRI essentially nothing acute, neurology started patient empirically on Keppra -neurology consultation with Dr. ZIrish Elders Discussed with him -patient also had EEG did not show any epileptiform activity -EKG at admission prolonged QT interval...Marland Kitchenrevious EKG is reviewed acute intervals were within range -seen by neurology who is recommending prolonged EEG course and agreeable with Keppra 500 mg twice daily and Keppra post hemodialysis -Intermittent episodes of confusion resolved after discontinuing Remeron   2.ESR D on dialysis -nephrology following -Had hemodialysis 11/29, 11/30  [facility]  3.Peripheral vascular disease with recent left below knee amputation -continue eliquis -Discussed with Dr. dew yesterday not ready for staple removal yet.  Follow-up with vascular surgery  4.  Anemia of chronic kidney disease. Hemoglobin at 7.5 Epogen with hemodialysis treatment    5.Discharge planning. Social worker consultation for discharge planning.  6.DVT prophylaxis on eliquis  Disposition  back to [facility].  Discussed with the patient, her daughter and healthcare power of attorney [employer] all are agreeable DISCHARGE CONDITIONS:   Stable  CONSULTS OBTAINED:  Treatment Team:  Leotis Pain, MD Algernon Huxley, MD   PROCEDURES NONE  DRUG  ALLERGIES:   Allergies  Allergen Reactions  . Chlorthalidone Anaphylaxis, Itching and Rash  . Fentanyl Rash  . Midazolam Rash  . Ace Inhibitors Other (See Comments)    Reaction:  Hyperkalemia, agitation   . Angiotensin Receptor Blockers Other (See Comments)    Reaction:  Hyperkalemia, agitation   . Norvasc [Amlodipine] Itching and Rash  . Phenergan [Promethazine Hcl] Anxiety    "antsy, can't sit still"    DISCHARGE MEDICATIONS:   Allergies as of 03/23/2018      Reactions   Chlorthalidone Anaphylaxis, Itching, Rash   Fentanyl Rash   Midazolam Rash   Ace Inhibitors Other (See Comments)   Reaction:  Hyperkalemia, agitation    Angiotensin Receptor Blockers Other (See Comments)   Reaction:  Hyperkalemia, agitation    Norvasc [amlodipine] Itching, Rash   Phenergan [promethazine Hcl] Anxiety   "antsy, can't sit still"      Medication List    STOP taking these medications   mirtazapine 7.5 MG tablet Commonly known as:  REMERON   oxyCODONE 5 MG immediate release tablet Commonly known as:  Oxy IR/ROXICODONE     TAKE these medications   acetaminophen 325 MG tablet Commonly known as:  TYLENOL Take 2 tablets (650 mg total) by mouth every 6 (six) hours as needed for mild pain (or Fever >/= 101).   apixaban 2.5 MG Tabs tablet Commonly known as:  ELIQUIS Take 1 tablet (2.5 mg total) by mouth 2 (two) times daily.   aspirin EC 81 MG tablet Take 1 tablet (81 mg total) by mouth daily.   atorvastatin 80 MG tablet Commonly known as:  LIPITOR Take 80 mg by mouth at bedtime.   brimonidine 0.2 % ophthalmic solution Commonly known as:  ALPHAGAN Place 1 drop into both eyes 3 (three) times daily.   calcitRIOL 0.5 MCG capsule Commonly known as:  ROCALTROL Take 0.5 mcg by mouth daily.   carvedilol 6.25 MG tablet Commonly known as:  COREG Take 1 tablet (6.25 mg total) by mouth 2 (two) times daily.   docusate sodium 100 MG capsule Commonly known as:  COLACE Take 1 capsule (100  mg total) by mouth 2 (two) times daily.   epoetin alfa 10000 UNIT/ML injection Commonly known as:  EPOGEN,PROCRIT Inject 1 mL (10,000 Units total) into the vein Every Tuesday,Thursday,and Saturday with dialysis. Start taking on:  03/24/2018   feeding supplement (NEPRO CARB STEADY) Liqd Take 237 mLs by mouth 2 (two) times daily between meals.   folic acid 1 MG tablet Commonly known as:  FOLVITE Take 1 tablet (1 mg total) by mouth daily.   isosorbide mononitrate 30 MG 24 hr tablet Commonly known as:  IMDUR Take 30 mg by mouth daily.   levETIRAcetam 500 MG tablet Commonly known as:  KEPPRA Take 1 tablet (500 mg total) by mouth 2 (two) times daily. What changed:  when to take this   levETIRAcetam 250 MG tablet Commonly known as:  KEPPRA Take 1 tablet (250 mg total) by mouth 3 (three) times a week. Start taking on:  03/25/2018 What changed:  You were already taking a medication with the same name, and this prescription was added. Make sure you understand how and when to take each.  midodrine 10 MG tablet Commonly known as:  PROAMATINE Take 1 tablet (10 mg total) by mouth 3 (three) times a week. What changed:  additional instructions   multivitamin with minerals Tabs tablet Take 2 tablets by mouth daily. Gummy vitamins   nitroGLYCERIN 0.4 MG SL tablet Commonly known as:  NITROSTAT Place 1 tablet (0.4 mg total) under the tongue every 5 (five) minutes as needed for chest pain.   nizatidine 150 MG capsule Commonly known as:  AXID Take 1 capsule (150 mg total) by mouth 2 (two) times daily.   Omega-3 300 MG Caps Take 2 capsules by mouth daily.   ondansetron 4 MG tablet Commonly known as:  ZOFRAN Take 4 mg by mouth every 8 (eight) hours as needed for nausea or vomiting.   Polyethyl Glycol-Propyl Glycol 0.4-0.3 % Soln Place 1 drop into both eyes 2 (two) times daily.   PRORENAL + D Tabs Take 1 tablet by mouth daily.   sevelamer carbonate 800 MG tablet Commonly known as:   RENVELA Take 800 mg by mouth 3 (three) times daily with meals.   timolol 0.5 % ophthalmic solution Commonly known as:  TIMOPTIC Place 1 drop into both eyes 3 (three) times daily.   traMADol 50 MG tablet Commonly known as:  ULTRAM Take 1 tablet (50 mg total) by mouth every 8 (eight) hours as needed for moderate pain or severe pain.   vitamin B-12 1000 MCG tablet Commonly known as:  CYANOCOBALAMIN Take 1 tablet (1,000 mcg total) by mouth daily.        DISCHARGE INSTRUCTIONS:  Follow-up with primary care physician at [facility] in 2 to 3 days Continue hemodialysis at [facility]rology [facility] Follow-up with [facility] nephrology in 5 to 7 days Follow-up with vascular surgery Dr. Lucky Cowboy as recommended and also for staple removal  DIET:  Renal diet  DISCHARGE CONDITION:  Fair  ACTIVITY:  Activity as tolerated  OXYGEN:  Home Oxygen: No.   Oxygen Delivery: room air  DISCHARGE LOCATION:  nursing home   If you experience worsening of your admission symptoms, develop shortness of breath, life threatening emergency, suicidal or homicidal thoughts you must seek medical attention immediately by calling 911 or calling your MD immediately  if symptoms less severe.  You Must read complete instructions/literature along with all the possible adverse reactions/side effects for all the Medicines you take and that have been prescribed to you. Take any new Medicines after you have completely understood and accpet all the possible adverse reactions/side effects.   Please note  You were cared for by a hospitalist during your hospital stay. If you have any questions about your discharge medications or the care you received while you were in the hospital after you are discharged, you can call the unit and asked to speak with the hospitalist on call if the hospitalist that took care of you is not available. Once you are discharged, your primary care physician will handle any further medical  issues. Please note that NO REFILLS for any discharge medications will be authorized once you are discharged, as it is imperative that you return to your primary care physician (or establish a relationship with a primary care physician if you do not have one) for your aftercare needs so that they can reassess your need for medications and monitor your lab values.     Today  Chief Complaint  Patient presents with  . Loss of Consciousness   Patient is feeling much better.  More awake and  alert and playing with her granddaughter.  Daughter at bedside.  Patient is agreeable as well as her healthcare power of attorney is agreeable to go back to [place]:  CONSTITUTIONAL: Denies fevers, chills. Denies any fatigue, weakness.  EYES: Denies blurry vision, double vision, eye pain. EARS, NOSE, THROAT: Denies tinnitus, ear pain, hearing loss. RESPIRATORY: Denies cough, wheeze, shortness of breath.  CARDIOVASCULAR: Denies chest pain, palpitations, edema.  GASTROINTESTINAL: Denies nausea, vomiting, diarrhea, abdominal pain. Denies bright red blood per rectum. GENITOURINARY: Denies dysuria, hematuria. ENDOCRINE: Denies nocturia or thyroid problems. HEMATOLOGIC AND LYMPHATIC: Denies easy bruising or bleeding. SKIN: Denies rash or lesion. MUSCULOSKELETAL: Denies pain in neck, back, shoulder, knees, hips or arthritic symptoms.  NEUROLOGIC: Denies paralysis, paresthesias.  PSYCHIATRIC: Denies anxiety or depressive symptoms.   VITAL SIGNS:  Blood pressure (!) 158/52, pulse 66, temperature 98.9 F (37.2 C), temperature source Oral, resp. rate 18, height 5' 4" (1.626 m), weight 52.7 kg, SpO2 91 %.  I/O:  No intake or output data in the 24 hours ending 03/23/18 1025  PHYSICAL EXAMINATION:  GENERAL:  67 y.o.-year-old patient lying in the bed with no acute distress.  EYES: Pupils equal, round, reactive to light and accommodation. No scleral icterus. Extraocular muscles intact.  HEENT: Head  atraumatic, normocephalic. Oropharynx and nasopharynx clear.  NECK:  Supple, no jugular venous distention. No thyroid enlargement, no tenderness.  LUNGS: Normal breath sounds bilaterally, no wheezing, rales,rhonchi or crepitation. No use of accessory muscles of respiration.  CARDIOVASCULAR: S1, S2 normal. No murmurs, rubs, or gallops.  ABDOMEN: Soft, non-tender, non-distended. Bowel sounds present.  EXTREMITIES: Bilateral BKA.  Left BKA with staples healing well no pedal edema, cyanosis, or clubbing.  NEUROLOGIC: Awake alert and oriented x3 sensation intact. Gait not checked.  PSYCHIATRIC: The patient is alert and oriented x 3.  SKIN: No obvious rash, lesion, or ulcer.   DATA REVIEW:   CBC Recent Labs  Lab 03/23/18 0412  WBC 8.7  HGB 7.5*  HCT 26.2*  PLT 312    Chemistries  Recent Labs  Lab 03/23/18 0412  NA 139  K 4.1  CL 97*  CO2 31  GLUCOSE 137*  BUN 22  CREATININE 5.29*  CALCIUM 9.2    Cardiac Enzymes No results for input(s): TROPONINI in the last 168 hours.  Microbiology Results  Results for orders placed or performed during the hospital encounter of 03/18/18  MRSA PCR Screening     Status: None   Collection Time: 03/20/18  6:33 AM  Result Value Ref Range Status   MRSA by PCR NEGATIVE NEGATIVE Final    Comment:        The GeneXpert MRSA Assay (FDA approved for NASAL specimens only), is one component of a comprehensive MRSA colonization surveillance program. It is not intended to diagnose MRSA infection nor to guide or monitor treatment for MRSA infections. Performed at [facility], [address], [place], [place] [address]     RADIOLOGY:  No results found.  EKG:   Orders placed or performed during the hospital encounter of 03/18/18  . ED EKG  . ED EKG      Management plans discussed with the patient, family and they are in agreement.  CODE STATUS:     Code Status Orders  (From admission, onward)         Start      Ordered   03/19/18 1215  Do not attempt resuscitation (DNR)  Continuous    Question Answer Comment  In  the event of cardiac or respiratory ARREST Do not call a "code blue"   In the event of cardiac or respiratory ARREST Do not perform Intubation, CPR, defibrillation or ACLS   In the event of cardiac or respiratory ARREST Use medication by any route, position, wound care, and other measures to relive pain and suffering. May use oxygen, suction and manual treatment of airway obstruction as needed for comfort.   Comments DNI      03/19/18 1215        Code Status History    Date Active Date Inactive Code Status Order ID Comments User Context   03/14/2018 2310 03/18/2018 2018 DNR [id number]  Erlene Quan, NP Inpatient   03/10/2018 0837 03/14/2018 2310 Full Code [id number]  Gladstone Lighter, MD Inpatient   02/18/2018 1000 02/28/2018 2122 Full Code [id number]  Loletha Grayer, MD ED   02/01/2018 0412 02/15/2018 2250 Full Code [id number]  Arta Silence, MD Inpatient   01/31/2018 2300 02/01/2018 0412 DNR [id number]  Amelia Jo, MD Inpatient   01/31/2018 2037 01/31/2018 2300 DNR [id number]  Awilda Bill, NP Inpatient   01/29/2018 2250 01/31/2018 2037 Full Code [id number]  Amelia Jo, MD Inpatient   10/24/2016 1055 10/25/2016 2243 Full Code [id number]  Hillary Bow, MD ED   10/17/2016 2143 10/19/2016 1440 Full Code [id number]  Vaughan Basta, MD Inpatient   09/04/2016 1708 09/05/2016 1500 Full Code [id number]  Epifanio Lesches, MD ED   08/22/2016 0938 08/22/2016 1833 Full Code [id number]  Algernon Huxley, MD Inpatient   06/27/2016 1121 06/27/2016 1846 Full Code [id number]  Algernon Huxley, MD Inpatient   10/03/2015 2235 10/04/2015 1724 Full Code [id number]  Harrie Foreman, MD Inpatient      TOTAL TIME TAKING CARE OF THIS PATIENT: 45  minutes.   Note: This dictation was prepared with Dragon dictation along with smaller phrase technology. Any transcriptional errors that result from this  process are unintentional.   _0 @  on 03/23/2018 at 10:25 AM  Between 7am to 6pm - Pager - [phone]  After 6pm go to [email or web address] - password [email or web address] [facility]  Office  [phone]  CC: Primary care physician; Patient, No Pcp Per

## 2018-03-23 NOTE — Care Management Important Message (Signed)
Initial Medicare IM signed.  Copy left in room for reference.

## 2018-03-30 ENCOUNTER — Ambulatory Visit (INDEPENDENT_AMBULATORY_CARE_PROVIDER_SITE_OTHER): Payer: Medicare Other | Admitting: Nurse Practitioner

## 2018-03-30 ENCOUNTER — Encounter (INDEPENDENT_AMBULATORY_CARE_PROVIDER_SITE_OTHER): Payer: Self-pay | Admitting: Nurse Practitioner

## 2018-03-30 VITALS — BP 174/63 | HR 72 | Resp 18 | Ht 66.0 in | Wt 126.0 lb

## 2018-03-30 DIAGNOSIS — E118 Type 2 diabetes mellitus with unspecified complications: Secondary | ICD-10-CM

## 2018-03-30 DIAGNOSIS — Z89511 Acquired absence of right leg below knee: Secondary | ICD-10-CM

## 2018-03-30 DIAGNOSIS — E785 Hyperlipidemia, unspecified: Secondary | ICD-10-CM

## 2018-03-30 DIAGNOSIS — S88111A Complete traumatic amputation at level between knee and ankle, right lower leg, initial encounter: Secondary | ICD-10-CM | POA: Insufficient documentation

## 2018-03-30 MED ORDER — DOXYCYCLINE HYCLATE 100 MG PO CAPS: 100.0000 mg | ORAL_CAPSULE | Freq: Two times a day (BID) | ORAL | 0 refills | Status: DC

## 2018-03-30 MED ORDER — SILVER SULFADIAZINE 1 % EX CREA: 1.0000 "application " | TOPICAL_CREAM | Freq: Every day | CUTANEOUS | 0 refills | Status: DC

## 2018-03-30 NOTE — Progress Notes (Signed)
Subjective:    Patient ID: Stephanie Ellis, female    DOB: July 31, 1950, 67 y.o.   MRN: [id number] Chief Complaint  Patient presents with  . Follow-up    3 week [facility] f/u    HPI  Stephanie Ellis is a 67 y.o. female that is following up in the office status post right below the knee amputation.  The patient has a previous history of a left below the knee done 02/06/2018. Patient had a well approximated stump wound, however there were some small areas of purulent drainage.  The wound is still somewhat "wet" in some areas.  The medial area of the wound is swollen and bruised.  There is also an area of irritation above this area.  Patient denies any pain to the area.  She denies any phantom limb pain.  The patient denies any fever, chills, nausea, vomiting or diarrhea.  Patient denies any chest pain or shortness of breath.  She denies any amaurosis fugax or TIA-like symptoms.  Past Medical History:  Diagnosis Date  . (HFpEF) heart failure with preserved ejection fraction ([place])    a. 01/2018 Echo: EF 50-55%, no rwma, Gr1 DD, triv AI, mild MR. Midly dil LA/RV, mod reduced RV fxn. Irreg thickening of TV w/ mobile echodensity that appears to arise from valve.  . Anemia   . Anorexia   . Bacteremia due to Pseudomonas   . Carotid arterial disease ([place])    a. 01/2017 Carotid U/S: RICA <28, LICA <31.  Marland Kitchen Complication of anesthesia    a. Pt reports h/o complication on 9 different occasions - ? hypotension/arrest.  . 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 01/2015 Cath [facility]): 70% LM, p-mLAD 50-60% (Resting FFR 0.75), mRCA 80-90%, ~40 Ost OM & D1-->CABG; b. 01/2016 Staged PCI of LCX x 2 and RCA.  Marland Kitchen ESRD (end stage renal disease) on dialysis ([place])    a. ESRD secondary to acute kidney failure s/p CABG-->PD  . Essential hypertension   . GERD (gastroesophageal reflux disease)   . Heart murmur   . Hypercholesterolemia   . Myocardial infarction ([place]) 2016  . PVD (peripheral  vascular disease) ([place])    a. 01/30/2018 PV Angio: Sev Left Popliteal dzs s/p PTA w/ 14m lutonix DEB w/ mech aspiration of the L popliteal, L AT, and Left tibioperoneal trunck and peroneal artery.  . 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  [facility]: Dr. BMarland KitchenHaithcock: CABG x 3, LIMA to LAD, SVG to RCA, SVG to OM3, EVH  . Sinusitis 2019  . Type II diabetes mellitus with complication (HCC)    CAD    Past Surgical History:  Procedure Laterality Date  . ABDOMINAL HYSTERECTOMY    . AMPUTATION Right 02/25/2018   Procedure: AMPUTATION BELOW KNEE;  Surgeon: SKatha Cabal MD;  Location: [facility] ORS;  Service: Vascular;  Laterality: Right;  . AMPUTATION Left 03/11/2018   Procedure: AMPUTATION BELOW KNEE;  Surgeon: DAlgernon Huxley MD;  Location: [facility];  Service: Vascular;  Laterality: Left;  . AMPUTATION TOE Left 02/06/2018   Procedure: AMPUTATION GREAT TOE;  Surgeon: FSamara Deist DPM;  Location: [facility];  Service: Podiatry;  Laterality: Left;  . ARTERIOVENOUS GRAFT PLACEMENT  05/2016  . AV FISTULA PLACEMENT Left 05/30/2016   Procedure: ARTERIOVENOUS graft;  Surgeon: JAlgernon Huxley MD;  Location: [facility];  Service: Vascular;  Laterality: Left;  . CAPD INSERTION N/A 10/30/2017   Procedure: LAPAROSCOPIC INSERTION CONTINUOUS AMBULATORY PERITONEAL DIALYSIS  (  CAPD) CATHETER;  Surgeon: Algernon Huxley, MD;  Location: [facility];  Service: Vascular;  Laterality: N/A;  . CARDIAC CATHETERIZATION  01/2015   [facility]: Ost LM 70%, p-m LAD 50-60% (Rest FFR + @ 0.75), mRCA 80-90%, ostD1 40%, pOM1 40%  . CATARACT EXTRACTION W/PHACO Left 01/18/2016   Procedure: CATARACT EXTRACTION PHACO AND INTRAOCULAR LENS PLACEMENT (IOC);  Surgeon: Eulogio Bear, MD;  Location: [facility];  Service: Ophthalmology;  Laterality: Left;  [place] 1.05 AP% 15.5 CDE 10.16 Fluid Pack Lot # [id number]  . CATARACT EXTRACTION W/PHACO Right 08/01/2016   Procedure: CATARACT EXTRACTION PHACO AND INTRAOCULAR LENS PLACEMENT (IOC);  Surgeon: Eulogio Bear, MD;   Location: [facility];  Service: Ophthalmology;  Laterality: Right;  [place] 01:00.6 AP% 11.4 CDE 6.93  LOT # [id number]  . COLONOSCOPY    . CORONARY ANGIOPLASTY     SENTS 02/12/16  . CORONARY ARTERY BYPASS GRAFT  03/28/15    [facility]: Dr. Waldemar Dickens: LIMA to LAD, SVG to RCA, SVG to OM3, EVH  . DIALYSIS/PERMA CATHETER INSERTION N/A 02/11/2018   Procedure: DIALYSIS/PERMA CATHETER INSERTION;  Surgeon: Algernon Huxley, MD;  Location: [place] [facility];  Service: Cardiovascular;  Laterality: N/A;  . DIALYSIS/PERMA CATHETER REMOVAL Right 02/04/2018   Procedure: DIALYSIS/PERMA CATHETER REMOVAL;  Surgeon: Katha Cabal, MD;  Location: [place] [facility];  Service: Cardiovascular;  Laterality: Right;  . ESOPHAGOGASTRODUODENOSCOPY (EGD) WITH PROPOFOL N/A 10/24/2016   Procedure: ESOPHAGOGASTRODUODENOSCOPY (EGD) WITH PROPOFOL;  Surgeon: [name], MD;  Location: [facility];  Service: Endoscopy;  Laterality: N/A;  . EYE SURGERY Bilateral    cataract surgery  . EYE SURGERY     drains for glaucoma  . INSERTION EXPRESS TUBE SHUNT Right 08/01/2016   Procedure: INSERTION EXPRESS TUBE SHUNT;  Surgeon: Eulogio Bear, MD;  Location: [facility];  Service: Ophthalmology;  Laterality: Right;  . INSERTION OF AHMED VALVE Left 08/15/2016   Procedure: INSERTION OF AHMED VALVE;  Surgeon: Eulogio Bear, MD;  Location: [facility];  Service: Ophthalmology;  Laterality: Left;  . INSERTION OF DIALYSIS CATHETER    . LOWER EXTREMITY ANGIOGRAPHY Right 02/19/2018   Procedure: Lower Extremity Angiography;  Surgeon: Algernon Huxley, MD;  Location: [place] [facility];  Service: Cardiovascular;  Laterality: Right;  . LOWER EXTREMITY ANGIOGRAPHY Left 01/30/2018   Procedure: Lower Extremity Angiography;  Surgeon: Katha Cabal, MD;  Location: [place] [facility];  Service: Cardiovascular;  Laterality: Left;  . REMOVAL OF A DIALYSIS CATHETER N/A 02/25/2018   Procedure: REMOVAL OF A DIALYSIS CATHETER;  Surgeon: Katha Cabal, MD;  Location: [facility];  Service: Vascular;  Laterality: N/A;  . TEMPORARY DIALYSIS CATHETER N/A 02/06/2018   Procedure: TEMPORARY DIALYSIS CATHETER;  Surgeon: Katha Cabal, MD;  Location: [place] [facility];  Service: Cardiovascular;  Laterality: N/A;  . TRANSTHORACIC ECHOCARDIOGRAM  January 2017    EF 60-65%. GR 2 DD. Mild degenerative mitral valve disease but no prolapse or regurgitation. Mild left atrial dilation. Mild to moderate LVH. Pericardial effusion gone  . UPPER EXTREMITY ANGIOGRAPHY Left 06/27/2016   Procedure: Upper Extremity Angiography;  Surgeon: Algernon Huxley, MD;  Location: [place] [facility];  Service: Cardiovascular;  Laterality: Left;  . UPPER EXTREMITY ANGIOGRAPHY Left 08/22/2016   Procedure: Upper Extremity Angiography;  Surgeon: Algernon Huxley, MD;  Location: [place] [facility];  Service: Cardiovascular;  Laterality: Left;  Marland Kitchen VASCULAR SURGERY      Social History   Socioeconomic History  .  Marital status: Single    Spouse name: Not on file  . Number of children: Not on file  . Years of education: Not on file  . Highest education level: Not on file  Occupational History  . Not on file  Social Needs  . Financial resource strain: Not on file  . Food insecurity:    Worry: Not on file    Inability: Not on file  . Transportation needs:    Medical: Not on file    Non-medical: Not on file  Tobacco Use  . Smoking status: Never Smoker  . Smokeless tobacco: Never Used  Substance and Sexual Activity  . Alcohol use: No  . Drug use: No  . Sexual activity: Never  Lifestyle  . Physical activity:    Days per week: Not on file    Minutes per session: Not on file  . Stress: Not on file  Relationships  . Social connections:    Talks on phone: Not on file    Gets together: Not on file    Attends religious service: Not on file    Active member of club or organization: Not on file    Attends meetings of clubs or organizations: Not on file    Relationship  status: Not on file  . Intimate partner violence:    Fear of current or ex partner: Not on file    Emotionally abused: Not on file    Physically abused: Not on file    Forced sexual activity: Not on file  Other Topics Concern  . Not on file  Social History Narrative   Lives in [place] with family.    Family History  Problem Relation Age of Onset  . Diabetes Mellitus II Mother   . Heart failure Mother   . Pancreatic cancer Father     Allergies  Allergen Reactions  . Chlorthalidone Anaphylaxis, Itching and Rash  . Fentanyl Rash  . Midazolam Rash  . Ace Inhibitors Other (See Comments)    Reaction:  Hyperkalemia, agitation   . Angiotensin Receptor Blockers Other (See Comments)    Reaction:  Hyperkalemia, agitation   . Norvasc [Amlodipine] Itching and Rash  . Phenergan [Promethazine Hcl] Anxiety    "antsy, can't sit still"     Review of Systems   Review of Systems: Negative Unless Checked Constitutional: [] Weight loss  [] Fever  [] Chills Cardiac: [] Chest pain   []  Atrial Fibrillation  [] Palpitations   [] Shortness of breath when laying flat   [] Shortness of breath with exertion. Vascular:  [] Pain in legs with walking   [] Pain in legs with standing  [] History of DVT   [] Phlebitis   [] Swelling in legs   [] Varicose veins   [] Non-healing ulcers Pulmonary:   [] Uses home oxygen   [] Productive cough   [] Hemoptysis   [] Wheeze  [] COPD   [] Asthma Neurologic:  [] Dizziness   [] Seizures   [] History of stroke   [] History of TIA  [] Aphasia   [] Vissual changes   [] Weakness or numbness in arm   [] Weakness or numbness in leg Musculoskeletal:   [] Joint swelling   [] Joint pain   [] Low back pain  []  History of Knee Replacement Hematologic:  [] Easy bruising  [] Easy bleeding   [] Hypercoagulable state   [] Anemic Gastrointestinal:  [] Diarrhea   [] Vomiting  [] Gastroesophageal reflux/heartburn   [] Difficulty swallowing. Genitourinary:  [] Chronic kidney disease   [] Difficult urination  [] Anuric   [] Blood  in urine Skin:  [] Rashes   [] Ulcers  Psychological:  [] History of anxiety   []   History of major depression  []  Memory Difficulties     Objective:   Physical Exam  BP (!) 174/63 (BP Location: Right Arm, Patient Position: Sitting)   Pulse 72   Resp 18   Ht 5' 6"  (1.676 m)   Wt 126 lb (57.2 kg)   BMI 20.34 kg/m   Gen: WD/WN, NAD Head: [place]/AT, No temporalis wasting.  Ear/Nose/Throat: Hearing grossly intact, nares w/o erythema or drainage Eyes: PER, EOMI, sclera nonicteric.  Neck: Supple, no masses.  No JVD.  Pulmonary:  Good air movement, no use of accessory muscles.  Cardiac: RRR Vascular:  Wound well approximated, however oozing some purulent drainage in some areas.  Medial area appears bruised and swollen.  Skin above medial area appears Vessel Right Left  Radial Palpable Palpable   Gastrointestinal: soft, non-distended. No guarding/no peritoneal signs.  Musculoskeletal: Bilateral below-knee amputations Neurologic: Pain and light touch intact in extremities.  Symmetrical.  Speech is fluent. Motor exam as listed above. Psychiatric: Judgment intact, Mood & affect appropriate for pt's clinical situation. Dermatologic: No Venous rashes. No Ulcers Noted.  No changes consistent with cellulitis. Lymph : No Cervical lymphadenopathy, no lichenification or skin changes of chronic lymphedema.      Assessment & Plan:   1. Unilateral complete BKA, right, initial encounter ([place]) Today several staples were removed.  Approximately one third.  Several Steri-Strips were applied in larger areas.  We will give the patient a prescription for 10 days of doxycycline for the purulent drainage.  Also gave a prescription for sulfa Silvadene to apply to area of irritation once a day.  We will have the patient return following her antibiotic therapy in approximately 10 to 14 days to evaluate.  We will also allow time for her wound swelling to decrease as well.  2. Type II diabetes mellitus with  complication ([place]) Continue hypoglycemic medications as already ordered, these medications have been reviewed and there are no changes at this time.  Hgb A1C to be monitored as already arranged by primary service   3. Hyperlipidemia, unspecified hyperlipidemia type Continue statin as ordered and reviewed, no changes at this time    Current Outpatient Medications on File Prior to Visit  Medication Sig Dispense Refill  . acetaminophen (TYLENOL) 325 MG tablet Take 2 tablets (650 mg total) by mouth every 6 (six) hours as needed for mild pain (or Fever >/= 101).    Marland Kitchen apixaban (ELIQUIS) 2.5 MG TABS tablet Take 1 tablet (2.5 mg total) by mouth 2 (two) times daily. 60 tablet 0  . aspirin EC 81 MG tablet Take 1 tablet (81 mg total) by mouth daily.    Marland Kitchen atorvastatin (LIPITOR) 80 MG tablet Take 80 mg by mouth at bedtime.     . brimonidine (ALPHAGAN) 0.2 % ophthalmic solution Place 1 drop into both eyes 3 (three) times daily. 5 mL 12  . calcitRIOL (ROCALTROL) 0.5 MCG capsule Take 0.5 mcg by mouth daily.    . carvedilol (COREG) 6.25 MG tablet Take 1 tablet (6.25 mg total) by mouth 2 (two) times daily.    Marland Kitchen docusate sodium (COLACE) 100 MG capsule Take 1 capsule (100 mg total) by mouth 2 (two) times daily. 10 capsule 0  . epoetin alfa (EPOGEN,PROCRIT) 80034 UNIT/ML injection Inject 1 mL (10,000 Units total) into the vein Every Tuesday,Thursday,and Saturday with dialysis. 1 mL   . folic acid (FOLVITE) 1 MG tablet Take 1 tablet (1 mg total) by mouth daily. 30 tablet 3  . isosorbide mononitrate (IMDUR)  30 MG 24 hr tablet Take 30 mg by mouth daily.     Marland Kitchen levETIRAcetam (KEPPRA) 250 MG tablet Take 1 tablet (250 mg total) by mouth 3 (three) times a week.    . levETIRAcetam (KEPPRA) 500 MG tablet Take 1 tablet (500 mg total) by mouth 2 (two) times daily.    . midodrine (PROAMATINE) 10 MG tablet Take 1 tablet (10 mg total) by mouth 3 (three) times a week. (Patient taking differently: Take 10 mg by mouth 3 (three)  times a week. Take Tuesday, Thursday, and Saturday) 30 tablet 0  . mirtazapine (REMERON) 7.5 MG tablet Take by mouth.    . Multiple Vitamin (MULTIVITAMIN WITH MINERALS) TABS tablet Take 2 tablets by mouth daily. Gummy vitamins    . Multiple Vitamins-Minerals (PRORENAL + D) TABS Take 1 tablet by mouth daily.    . nitroGLYCERIN (NITROSTAT) 0.4 MG SL tablet Place 1 tablet (0.4 mg total) under the tongue every 5 (five) minutes as needed for chest pain. 30 tablet 0  . nizatidine (AXID) 150 MG capsule Take 1 capsule (150 mg total) by mouth 2 (two) times daily. 30 capsule 0  . Nutritional Supplements (FEEDING SUPPLEMENT, NEPRO CARB STEADY,) LIQD Take 237 mLs by mouth 2 (two) times daily between meals. 237 mL 0  . Omega-3 300 MG CAPS Take 2 capsules by mouth daily.    . ondansetron (ZOFRAN) 4 MG tablet Take 4 mg by mouth every 8 (eight) hours as needed for nausea or vomiting.    Vladimir Faster Glycol-Propyl Glycol 0.4-0.3 % SOLN Place 1 drop into both eyes 2 (two) times daily.    Marland Kitchen rOPINIRole (REQUIP) 0.25 MG tablet Take by mouth.    . sevelamer carbonate (RENVELA) 800 MG tablet Take 800 mg by mouth 3 (three) times daily with meals.    . timolol (TIMOPTIC) 0.5 % ophthalmic solution Place 1 drop into both eyes 3 (three) times daily. 10 mL 12  . traMADol (ULTRAM) 50 MG tablet Take 1 tablet (50 mg total) by mouth every 8 (eight) hours as needed for moderate pain or severe pain. 15 tablet 0  . valsartan (DIOVAN) 80 MG tablet Take by mouth.    . vitamin B-12 (CYANOCOBALAMIN) 1000 MCG tablet Take 1 tablet (1,000 mcg total) by mouth daily. 30 tablet 0   No current facility-administered medications on file prior to visit.     There are no Patient Instructions on file for this visit. Return in about 10 days (around 04/09/2018).   Kris Hartmann, NP  This note was completed with Sales executive.  Any errors are purely unintentional.

## 2018-04-07 ENCOUNTER — Other Ambulatory Visit
Admission: RE | Admit: 2018-04-07 | Discharge: 2018-04-07 | Disposition: A | Discharge status: Home / Self Care | Payer: Medicare Other | Source: Ambulatory Visit | Attending: Nephrology | Admitting: Nephrology

## 2018-04-07 DIAGNOSIS — D649 Anemia, unspecified: Secondary | ICD-10-CM | POA: Insufficient documentation

## 2018-04-07 LAB — HEMOGLOBIN: Hemoglobin: 6.7 g/dL — ABNORMAL LOW (ref 12.0–15.0)

## 2018-04-08 ENCOUNTER — Ambulatory Visit (INDEPENDENT_AMBULATORY_CARE_PROVIDER_SITE_OTHER): Payer: Medicare Other | Admitting: Nurse Practitioner

## 2018-04-10 ENCOUNTER — Ambulatory Visit
Admission: RE | Admit: 2018-04-10 | Discharge: 2018-04-10 | Disposition: A | Discharge status: Home / Self Care | Payer: Medicare Other | Source: Ambulatory Visit | Attending: Nephrology | Admitting: Nephrology

## 2018-04-10 DIAGNOSIS — T8754 Necrosis of amputation stump, left lower extremity: Secondary | ICD-10-CM | POA: Diagnosis not present

## 2018-04-10 DIAGNOSIS — D649 Anemia, unspecified: Secondary | ICD-10-CM

## 2018-04-10 DIAGNOSIS — M79662 Pain in left lower leg: Secondary | ICD-10-CM | POA: Diagnosis not present

## 2018-04-10 LAB — PREPARE RBC (CROSSMATCH)

## 2018-04-10 MED ORDER — SODIUM CHLORIDE 0.9% IV SOLUTION: Freq: Once | INTRAVENOUS | Status: DC

## 2018-04-10 MED ORDER — DIPHENHYDRAMINE HCL 25 MG PO CAPS
ORAL_CAPSULE | ORAL | Status: AC
  Filled 2018-04-10: qty 1

## 2018-04-10 MED ORDER — DIPHENHYDRAMINE HCL 25 MG PO CAPS
25.0000 mg | ORAL_CAPSULE | Freq: Once | ORAL | Status: AC
  Administered 2018-04-10: 25 mg via ORAL

## 2018-04-10 MED ORDER — ACETAMINOPHEN 325 MG PO TABS
650.0000 mg | ORAL_TABLET | Freq: Once | ORAL | Status: AC
  Administered 2018-04-10: 650 mg via ORAL

## 2018-04-10 MED ORDER — SODIUM CHLORIDE 0.9% IV SOLUTION
Freq: Once | INTRAVENOUS | Status: AC
  Administered 2018-04-10: 09:00:00 via INTRAVENOUS

## 2018-04-10 MED ORDER — ACETAMINOPHEN 325 MG PO TABS
ORAL_TABLET | ORAL | Status: AC
  Filled 2018-04-10: qty 2

## 2018-04-10 NOTE — Progress Notes (Signed)
Pt. Arrived from [facility] via wheelchair with staff member from [facility] . A/O x4 , VSS , IV sited left arm one unit of PRBC"s infused pt. Tolerated well . IV discontinued pt. Discharged home.

## 2018-04-11 LAB — BPAM RBC
Blood Product Expiration Date: 202001222359
ISSUE DATE / TIME: 201912201011
Unit Type and Rh: 7300

## 2018-04-11 LAB — TYPE AND SCREEN
ABO/RH(D): B POS
Antibody Screen: NEGATIVE
Unit division: 0

## 2018-04-13 ENCOUNTER — Other Ambulatory Visit: Payer: Self-pay

## 2018-04-13 ENCOUNTER — Inpatient Hospital Stay: Payer: Medicare Other | Admitting: Anesthesiology

## 2018-04-13 ENCOUNTER — Encounter
Admission: EM | Disposition: A | Discharge status: Skilled Nursing Facility | Payer: Self-pay | Source: Skilled Nursing Facility | Attending: Internal Medicine

## 2018-04-13 ENCOUNTER — Ambulatory Visit (INDEPENDENT_AMBULATORY_CARE_PROVIDER_SITE_OTHER): Payer: Medicare Other | Admitting: Nurse Practitioner

## 2018-04-13 ENCOUNTER — Inpatient Hospital Stay
Admission: EM | Admit: 2018-04-13 | Discharge: 2018-04-16 | DRG: 463 | Disposition: A | Discharge status: Skilled Nursing Facility | Payer: Medicare Other | Source: Skilled Nursing Facility | Attending: Internal Medicine | Admitting: Internal Medicine

## 2018-04-13 DIAGNOSIS — M869 Osteomyelitis, unspecified: Secondary | ICD-10-CM | POA: Diagnosis present

## 2018-04-13 DIAGNOSIS — Z90710 Acquired absence of both cervix and uterus: Secondary | ICD-10-CM | POA: Diagnosis not present

## 2018-04-13 DIAGNOSIS — T8130XA Disruption of wound, unspecified, initial encounter: Secondary | ICD-10-CM

## 2018-04-13 DIAGNOSIS — N2581 Secondary hyperparathyroidism of renal origin: Secondary | ICD-10-CM | POA: Diagnosis present

## 2018-04-13 DIAGNOSIS — I132 Hypertensive heart and chronic kidney disease with heart failure and with stage 5 chronic kidney disease, or end stage renal disease: Secondary | ICD-10-CM | POA: Diagnosis present

## 2018-04-13 DIAGNOSIS — I252 Old myocardial infarction: Secondary | ICD-10-CM

## 2018-04-13 DIAGNOSIS — E7800 Pure hypercholesterolemia, unspecified: Secondary | ICD-10-CM | POA: Diagnosis present

## 2018-04-13 DIAGNOSIS — T8754 Necrosis of amputation stump, left lower extremity: Principal | ICD-10-CM | POA: Diagnosis present

## 2018-04-13 DIAGNOSIS — I2510 Atherosclerotic heart disease of native coronary artery without angina pectoris: Secondary | ICD-10-CM | POA: Diagnosis present

## 2018-04-13 DIAGNOSIS — E1152 Type 2 diabetes mellitus with diabetic peripheral angiopathy with gangrene: Secondary | ICD-10-CM | POA: Diagnosis present

## 2018-04-13 DIAGNOSIS — T8781 Dehiscence of amputation stump: Secondary | ICD-10-CM | POA: Diagnosis present

## 2018-04-13 DIAGNOSIS — I5032 Chronic diastolic (congestive) heart failure: Secondary | ICD-10-CM | POA: Diagnosis present

## 2018-04-13 DIAGNOSIS — M79662 Pain in left lower leg: Secondary | ICD-10-CM | POA: Diagnosis present

## 2018-04-13 DIAGNOSIS — Z992 Dependence on renal dialysis: Secondary | ICD-10-CM | POA: Diagnosis not present

## 2018-04-13 DIAGNOSIS — Y835 Amputation of limb(s) as the cause of abnormal reaction of the patient, or of later complication, without mention of misadventure at the time of the procedure: Secondary | ICD-10-CM | POA: Diagnosis present

## 2018-04-13 DIAGNOSIS — Z833 Family history of diabetes mellitus: Secondary | ICD-10-CM

## 2018-04-13 DIAGNOSIS — I998 Other disorder of circulatory system: Secondary | ICD-10-CM | POA: Diagnosis present

## 2018-04-13 DIAGNOSIS — D631 Anemia in chronic kidney disease: Secondary | ICD-10-CM | POA: Diagnosis present

## 2018-04-13 DIAGNOSIS — N186 End stage renal disease: Secondary | ICD-10-CM | POA: Diagnosis present

## 2018-04-13 DIAGNOSIS — Z66 Do not resuscitate: Secondary | ICD-10-CM | POA: Diagnosis present

## 2018-04-13 DIAGNOSIS — Z955 Presence of coronary angioplasty implant and graft: Secondary | ICD-10-CM

## 2018-04-13 DIAGNOSIS — T879 Unspecified complications of amputation stump: Secondary | ICD-10-CM | POA: Diagnosis present

## 2018-04-13 DIAGNOSIS — Z951 Presence of aortocoronary bypass graft: Secondary | ICD-10-CM

## 2018-04-13 DIAGNOSIS — Z7982 Long term (current) use of aspirin: Secondary | ICD-10-CM

## 2018-04-13 DIAGNOSIS — Z7901 Long term (current) use of anticoagulants: Secondary | ICD-10-CM | POA: Diagnosis not present

## 2018-04-13 DIAGNOSIS — W050XXA Fall from non-moving wheelchair, initial encounter: Secondary | ICD-10-CM | POA: Diagnosis present

## 2018-04-13 DIAGNOSIS — E1122 Type 2 diabetes mellitus with diabetic chronic kidney disease: Secondary | ICD-10-CM | POA: Diagnosis present

## 2018-04-13 DIAGNOSIS — E1169 Type 2 diabetes mellitus with other specified complication: Secondary | ICD-10-CM | POA: Diagnosis present

## 2018-04-13 DIAGNOSIS — Z794 Long term (current) use of insulin: Secondary | ICD-10-CM | POA: Diagnosis not present

## 2018-04-13 DIAGNOSIS — Z79899 Other long term (current) drug therapy: Secondary | ICD-10-CM | POA: Diagnosis not present

## 2018-04-13 HISTORY — PX: APPLICATION OF WOUND VAC: SHX5189

## 2018-04-13 HISTORY — PX: AMPUTATION: SHX166

## 2018-04-13 LAB — COMPREHENSIVE METABOLIC PANEL
ALT: 15 U/L (ref 0–44)
AST: 18 U/L (ref 15–41)
Albumin: 3.2 g/dL — ABNORMAL LOW (ref 3.5–5.0)
Alkaline Phosphatase: 147 U/L — ABNORMAL HIGH (ref 38–126)
Anion gap: 10 (ref 5–15)
BUN: 39 mg/dL — ABNORMAL HIGH (ref 8–23)
CO2: 27 mmol/L (ref 22–32)
Calcium: 10.1 mg/dL (ref 8.9–10.3)
Chloride: 100 mmol/L (ref 98–111)
Creatinine, Ser: 5.48 mg/dL — ABNORMAL HIGH (ref 0.44–1.00)
GFR calc Af Amer: 9 mL/min — ABNORMAL LOW (ref >60)
GFR calc non Af Amer: 7 mL/min — ABNORMAL LOW (ref >60)
Glucose, Bld: 167 mg/dL — ABNORMAL HIGH (ref 70–99)
Potassium: 4.6 mmol/L (ref 3.5–5.1)
Sodium: 137 mmol/L (ref 135–145)
Total Bilirubin: 0.4 mg/dL (ref 0.3–1.2)
Total Protein: 7.9 g/dL (ref 6.5–8.1)

## 2018-04-13 LAB — CBC WITH DIFFERENTIAL/PLATELET
Abs Immature Granulocytes: 0.05 10*3/uL (ref 0.00–0.07)
Basophils Absolute: 0 10*3/uL (ref 0.0–0.1)
Basophils Relative: 0 %
Eosinophils Absolute: 0.3 10*3/uL (ref 0.0–0.5)
Eosinophils Relative: 3 %
HCT: 33.7 % — ABNORMAL LOW (ref 36.0–46.0)
Hemoglobin: 10 g/dL — ABNORMAL LOW (ref 12.0–15.0)
Immature Granulocytes: 1 %
Lymphocytes Relative: 19 %
Lymphs Abs: 1.6 10*3/uL (ref 0.7–4.0)
MCH: 29.7 pg (ref 26.0–34.0)
MCHC: 29.7 g/dL — ABNORMAL LOW (ref 30.0–36.0)
MCV: 100 fL (ref 80.0–100.0)
Monocytes Absolute: 0.6 10*3/uL (ref 0.1–1.0)
Monocytes Relative: 8 %
Neutro Abs: 5.9 10*3/uL (ref 1.7–7.7)
Neutrophils Relative %: 69 %
Platelets: 266 10*3/uL (ref 150–400)
RBC: 3.37 MIL/uL — ABNORMAL LOW (ref 3.87–5.11)
RDW: 18.4 % — ABNORMAL HIGH (ref 11.5–15.5)
WBC: 8.5 10*3/uL (ref 4.0–10.5)
nRBC: 0 % (ref 0.0–0.2)

## 2018-04-13 LAB — GLUCOSE, CAPILLARY
Glucose-Capillary: 145 mg/dL — ABNORMAL HIGH (ref 70–99)
Glucose-Capillary: 142 mg/dL — ABNORMAL HIGH (ref 70–99)

## 2018-04-13 LAB — SAMPLE TO BLOOD BANK

## 2018-04-13 SURGERY — AMPUTATION BELOW KNEE
Anesthesia: General | Laterality: Left

## 2018-04-13 MED ORDER — ACETAMINOPHEN 325 MG PO TABS
650.0000 mg | ORAL_TABLET | Freq: Four times a day (QID) | ORAL | Status: DC | PRN
  Administered 2018-04-14: 650 mg via ORAL
  Filled 2018-04-13 (×2): qty 2

## 2018-04-13 MED ORDER — HYDROMORPHONE HCL 1 MG/ML IJ SOLN
0.2500 mg | INTRAMUSCULAR | Status: DC | PRN
  Administered 2018-04-13 (×3): 0.25 mg via INTRAVENOUS

## 2018-04-13 MED ORDER — OMEGA-3-ACID ETHYL ESTERS 1 G PO CAPS
2.0000 | ORAL_CAPSULE | Freq: Every day | ORAL | Status: DC
  Administered 2018-04-13 – 2018-04-16 (×3): 2 g via ORAL
  Filled 2018-04-13 (×4): qty 2

## 2018-04-13 MED ORDER — ROPINIROLE HCL 0.25 MG PO TABS
0.2500 mg | ORAL_TABLET | Freq: Every day | ORAL | Status: DC
  Administered 2018-04-13 – 2018-04-15 (×3): 0.25 mg via ORAL
  Filled 2018-04-13 (×4): qty 1

## 2018-04-13 MED ORDER — ATORVASTATIN CALCIUM 20 MG PO TABS
80.0000 mg | ORAL_TABLET | Freq: Every day | ORAL | Status: DC
  Administered 2018-04-13 – 2018-04-15 (×3): 80 mg via ORAL
  Filled 2018-04-13 (×3): qty 4

## 2018-04-13 MED ORDER — ACETAMINOPHEN 325 MG PO TABS
650.0000 mg | ORAL_TABLET | Freq: Four times a day (QID) | ORAL | Status: DC | PRN
  Administered 2018-04-16: 650 mg via ORAL

## 2018-04-13 MED ORDER — EPHEDRINE SULFATE 50 MG/ML IJ SOLN
INTRAMUSCULAR | Status: DC | PRN
  Administered 2018-04-13: 10 mg via INTRAVENOUS

## 2018-04-13 MED ORDER — HYDROMORPHONE HCL 1 MG/ML IJ SOLN
INTRAMUSCULAR | Status: AC
  Administered 2018-04-13: 0.25 mg via INTRAVENOUS
  Filled 2018-04-13: qty 1

## 2018-04-13 MED ORDER — ONDANSETRON HCL 4 MG PO TABS: 4.0000 mg | ORAL_TABLET | Freq: Four times a day (QID) | ORAL | Status: DC | PRN

## 2018-04-13 MED ORDER — ACETAMINOPHEN 650 MG RE SUPP: 650.0000 mg | Freq: Four times a day (QID) | RECTAL | Status: DC | PRN

## 2018-04-13 MED ORDER — CALCITRIOL 0.25 MCG PO CAPS
0.5000 ug | ORAL_CAPSULE | Freq: Every day | ORAL | Status: DC
  Administered 2018-04-13 – 2018-04-16 (×4): 0.5 ug via ORAL
  Filled 2018-04-13 (×4): qty 2

## 2018-04-13 MED ORDER — TIMOLOL MALEATE 0.5 % OP SOLN
1.0000 [drp] | Freq: Three times a day (TID) | OPHTHALMIC | Status: DC
  Administered 2018-04-13 – 2018-04-16 (×8): 1 [drp] via OPHTHALMIC
  Filled 2018-04-13: qty 5

## 2018-04-13 MED ORDER — ACETAMINOPHEN 325 MG PO TABS: 650.0000 mg | ORAL_TABLET | Freq: Four times a day (QID) | ORAL | Status: DC | PRN

## 2018-04-13 MED ORDER — LEVETIRACETAM 250 MG PO TABS: 250.0000 mg | ORAL_TABLET | ORAL | Status: DC

## 2018-04-13 MED ORDER — PROPOFOL 10 MG/ML IV BOLUS
INTRAVENOUS | Status: DC | PRN
  Administered 2018-04-13: 100 mg via INTRAVENOUS

## 2018-04-13 MED ORDER — HEPARIN SODIUM (PORCINE) 5000 UNIT/ML IJ SOLN: 5000.0000 [IU] | Freq: Three times a day (TID) | INTRAMUSCULAR | Status: DC

## 2018-04-13 MED ORDER — NITROGLYCERIN 0.4 MG SL SUBL: 0.4000 mg | SUBLINGUAL_TABLET | SUBLINGUAL | Status: DC | PRN

## 2018-04-13 MED ORDER — ONDANSETRON HCL 4 MG/2ML IJ SOLN: 4.0000 mg | Freq: Four times a day (QID) | INTRAMUSCULAR | Status: DC | PRN

## 2018-04-13 MED ORDER — ADULT MULTIVITAMIN W/MINERALS CH
1.0000 | ORAL_TABLET | Freq: Every day | ORAL | Status: DC
  Administered 2018-04-13 – 2018-04-16 (×2): 1 via ORAL
  Filled 2018-04-13 (×3): qty 1

## 2018-04-13 MED ORDER — MIDODRINE HCL 5 MG PO TABS
10.0000 mg | ORAL_TABLET | ORAL | Status: DC
  Administered 2018-04-16: 10 mg via ORAL
  Filled 2018-04-13 (×2): qty 2

## 2018-04-13 MED ORDER — CEFAZOLIN SODIUM-DEXTROSE 2-4 GM/100ML-% IV SOLN
INTRAVENOUS | Status: AC
  Filled 2018-04-13: qty 100

## 2018-04-13 MED ORDER — APIXABAN 2.5 MG PO TABS
2.5000 mg | ORAL_TABLET | Freq: Two times a day (BID) | ORAL | Status: DC
  Administered 2018-04-14 – 2018-04-16 (×5): 2.5 mg via ORAL
  Filled 2018-04-13 (×5): qty 1

## 2018-04-13 MED ORDER — MIRTAZAPINE 15 MG PO TABS: 7.5000 mg | ORAL_TABLET | Freq: Every day | ORAL | Status: DC

## 2018-04-13 MED ORDER — LEVETIRACETAM 250 MG PO TABS
250.0000 mg | ORAL_TABLET | ORAL | Status: DC
  Filled 2018-04-13: qty 1

## 2018-04-13 MED ORDER — POLYETHYLENE GLYCOL 3350 17 G PO PACK: 17.0000 g | PACK | Freq: Every day | ORAL | Status: DC | PRN

## 2018-04-13 MED ORDER — VITAMIN B-12 1000 MCG PO TABS
1000.0000 ug | ORAL_TABLET | Freq: Every day | ORAL | Status: DC
  Administered 2018-04-13 – 2018-04-16 (×4): 1000 ug via ORAL
  Filled 2018-04-13 (×4): qty 1

## 2018-04-13 MED ORDER — ONDANSETRON HCL 4 MG/2ML IJ SOLN
INTRAMUSCULAR | Status: DC | PRN
  Administered 2018-04-13: 4 mg via INTRAVENOUS

## 2018-04-13 MED ORDER — BACITRACIN 50000 UNITS IM SOLR
INTRAMUSCULAR | Status: AC
  Filled 2018-04-13: qty 1

## 2018-04-13 MED ORDER — LIDOCAINE HCL (CARDIAC) PF 100 MG/5ML IV SOSY
PREFILLED_SYRINGE | INTRAVENOUS | Status: DC | PRN
  Administered 2018-04-13: 50 mg via INTRAVENOUS

## 2018-04-13 MED ORDER — ISOSORBIDE MONONITRATE ER 30 MG PO TB24
30.0000 mg | ORAL_TABLET | Freq: Every day | ORAL | Status: DC
  Administered 2018-04-13: 30 mg via ORAL
  Filled 2018-04-13: qty 1

## 2018-04-13 MED ORDER — FOLIC ACID 1 MG PO TABS
1.0000 mg | ORAL_TABLET | Freq: Every day | ORAL | Status: DC
  Administered 2018-04-13 – 2018-04-16 (×4): 1 mg via ORAL
  Filled 2018-04-13 (×4): qty 1

## 2018-04-13 MED ORDER — CEFAZOLIN SODIUM-DEXTROSE 2-4 GM/100ML-% IV SOLN
2.0000 g | Freq: Once | INTRAVENOUS | Status: AC
  Administered 2018-04-13: 2 g via INTRAVENOUS

## 2018-04-13 MED ORDER — SODIUM CHLORIDE 0.9 % IV SOLN
INTRAVENOUS | Status: DC
  Administered 2018-04-13 (×2): via INTRAVENOUS

## 2018-04-13 MED ORDER — TRAMADOL HCL 50 MG PO TABS
50.0000 mg | ORAL_TABLET | Freq: Three times a day (TID) | ORAL | Status: DC | PRN
  Administered 2018-04-13 – 2018-04-15 (×4): 50 mg via ORAL
  Filled 2018-04-13 (×4): qty 1

## 2018-04-13 MED ORDER — FENTANYL CITRATE (PF) 100 MCG/2ML IJ SOLN
INTRAMUSCULAR | Status: AC
  Filled 2018-04-13: qty 2

## 2018-04-13 MED ORDER — BRIMONIDINE TARTRATE 0.2 % OP SOLN
1.0000 [drp] | Freq: Three times a day (TID) | OPHTHALMIC | Status: DC
  Administered 2018-04-13 – 2018-04-16 (×8): 1 [drp] via OPHTHALMIC
  Filled 2018-04-13: qty 5

## 2018-04-13 MED ORDER — IRBESARTAN 150 MG PO TABS: 75.0000 mg | ORAL_TABLET | Freq: Every day | ORAL | Status: DC

## 2018-04-13 MED ORDER — CARVEDILOL 6.25 MG PO TABS
6.2500 mg | ORAL_TABLET | Freq: Two times a day (BID) | ORAL | Status: DC
  Administered 2018-04-13 – 2018-04-16 (×6): 6.25 mg via ORAL
  Filled 2018-04-13 (×7): qty 1

## 2018-04-13 MED ORDER — LEVETIRACETAM 500 MG PO TABS
500.0000 mg | ORAL_TABLET | Freq: Two times a day (BID) | ORAL | Status: DC
  Administered 2018-04-13 – 2018-04-16 (×7): 500 mg via ORAL
  Filled 2018-04-13 (×7): qty 1

## 2018-04-13 MED ORDER — SEVELAMER CARBONATE 800 MG PO TABS
800.0000 mg | ORAL_TABLET | Freq: Three times a day (TID) | ORAL | Status: DC
  Administered 2018-04-15 – 2018-04-16 (×4): 800 mg via ORAL
  Filled 2018-04-13 (×4): qty 1

## 2018-04-13 MED ORDER — FAMOTIDINE 20 MG PO TABS
20.0000 mg | ORAL_TABLET | Freq: Two times a day (BID) | ORAL | Status: DC
  Administered 2018-04-13 – 2018-04-16 (×7): 20 mg via ORAL
  Filled 2018-04-13 (×7): qty 1

## 2018-04-13 MED ORDER — SODIUM CHLORIDE 0.9 % IV SOLN
INTRAVENOUS | Status: DC | PRN
  Administered 2018-04-13: 1000 mL

## 2018-04-13 MED ORDER — DOCUSATE SODIUM 100 MG PO CAPS
100.0000 mg | ORAL_CAPSULE | Freq: Two times a day (BID) | ORAL | Status: DC
  Administered 2018-04-13 – 2018-04-16 (×6): 100 mg via ORAL
  Filled 2018-04-13 (×7): qty 1

## 2018-04-13 SURGICAL SUPPLY — 48 items
BANDAGE ELASTIC 6 LF NS (GAUZE/BANDAGES/DRESSINGS) ×3 IMPLANT
BLADE SAGITTAL WIDE XTHICK NO (BLADE) IMPLANT
BNDG COHESIVE 4X5 TAN STRL (GAUZE/BANDAGES/DRESSINGS) ×3 IMPLANT
BNDG GAUZE 4.5X4.1 6PLY STRL (MISCELLANEOUS) ×6 IMPLANT
BRUSH SCRUB EZ  4% CHG (MISCELLANEOUS)
BRUSH SCRUB EZ 4% CHG (MISCELLANEOUS) IMPLANT
CANISTER SUCT 1200ML W/VALVE (MISCELLANEOUS) ×3 IMPLANT
COVER WAND RF STERILE (DRAPES) ×3 IMPLANT
DRAIN PENROSE 1/4X12 LTX (DRAIN) ×3 IMPLANT
DRAPE IMP U-DRAPE 54X76 (DRAPES) ×3 IMPLANT
DRAPE STERI IOBAN 125X83 (DRAPES) IMPLANT
DRSG VAC ATS MED SENSATRAC (GAUZE/BANDAGES/DRESSINGS) ×3 IMPLANT
DURAPREP 26ML APPLICATOR (WOUND CARE) ×3 IMPLANT
ELECT CAUTERY BLADE 6.4 (BLADE) ×3 IMPLANT
ELECT REM PT RETURN 9FT ADLT (ELECTROSURGICAL) ×3
ELECTRODE REM PT RTRN 9FT ADLT (ELECTROSURGICAL) ×2 IMPLANT
GAUZE PETRO XEROFOAM 1X8 (MISCELLANEOUS) ×6 IMPLANT
GLOVE BIO SURGEON STRL SZ7 (GLOVE) ×6 IMPLANT
GLOVE INDICATOR 7.5 STRL GRN (GLOVE) ×3 IMPLANT
GOWN STRL REUS W/ TWL LRG LVL3 (GOWN DISPOSABLE) ×4 IMPLANT
GOWN STRL REUS W/ TWL XL LVL3 (GOWN DISPOSABLE) ×2 IMPLANT
GOWN STRL REUS W/TWL LRG LVL3 (GOWN DISPOSABLE) ×2
GOWN STRL REUS W/TWL XL LVL3 (GOWN DISPOSABLE) ×1
HANDLE YANKAUER SUCT BULB TIP (MISCELLANEOUS) ×6 IMPLANT
KIT TURNOVER KIT A (KITS) ×3 IMPLANT
LABEL OR SOLS (LABEL) ×3 IMPLANT
NS IRRIG 1000ML POUR BTL (IV SOLUTION) ×3 IMPLANT
PACK EXTREMITY ARMC (MISCELLANEOUS) ×6 IMPLANT
PAD ABD DERMACEA PRESS 5X9 (GAUZE/BANDAGES/DRESSINGS) ×6 IMPLANT
PAD PREP 24X41 OB/GYN DISP (PERSONAL CARE ITEMS) ×3 IMPLANT
PULSAVAC PLUS IRRIG FAN TIP (DISPOSABLE) ×3
SPONGE LAP 18X18 RF (DISPOSABLE) ×3 IMPLANT
STAPLER SKIN PROX 35W (STAPLE) ×3 IMPLANT
STOCKINETTE M/LG 89821 (MISCELLANEOUS) ×3 IMPLANT
SUT ETHILON 3-0 FS-10 30 BLK (SUTURE) ×9
SUT SILK 2 0 (SUTURE)
SUT SILK 2 0 SH (SUTURE) ×6 IMPLANT
SUT SILK 2-0 18XBRD TIE 12 (SUTURE) IMPLANT
SUT SILK 3 0 (SUTURE) ×1
SUT SILK 3-0 18XBRD TIE 12 (SUTURE) ×2 IMPLANT
SUT VIC AB 0 CT1 36 (SUTURE) ×6 IMPLANT
SUT VIC AB 2-0 CT1 (SUTURE) IMPLANT
SUT VIC AB 2-0 CT1 27 (SUTURE) ×2
SUT VIC AB 2-0 CT1 TAPERPNT 27 (SUTURE) ×4 IMPLANT
SUT VICRYL+ 3-0 36IN CT-1 (SUTURE) ×3 IMPLANT
SUTURE EHLN 3-0 FS-10 30 BLK (SUTURE) ×6 IMPLANT
TIP FAN IRRIG PULSAVAC PLUS (DISPOSABLE) ×2 IMPLANT
WND VAC CANISTER 500ML (MISCELLANEOUS) ×3 IMPLANT

## 2018-04-13 NOTE — Transfer of Care (Signed)
Immediate Anesthesia Transfer of Care Note  Patient: Stephanie Ellis  Procedure(s) Performed: REVISION OF BILATERAL AMPUTATIONS BELOW KNEE WITH WOUND VAC APPLICATIONS (Bilateral ) APPLICATION OF WOUND VAC BILATERAL (Bilateral )  Patient Location: PACU  Anesthesia Type:General  Level of Consciousness: awake and alert   Airway & Oxygen Therapy: Patient Spontanous Breathing  Post-op Assessment: Report given to RN  Post vital signs: Reviewed and stable  Last Vitals:  Vitals Value Taken Time  BP    Temp    Pulse    Resp 10 04/13/2018  1:32 PM  SpO2    Vitals shown include unvalidated device data.  Last Pain:  Vitals:   04/13/18 1115  TempSrc: Oral  PainSc: 8       Patients Stated Pain Goal: 3 (97/67/34 1937)  Complications: No apparent anesthesia complications

## 2018-04-13 NOTE — Op Note (Signed)
Stephanie Ellis, [place] MEDICAL RECORD QA:[id number] ACCOUNT [id number] DATE OF BIRTH:November 25, 1950 FACILITY: [facility] LOCATION: [facility] PHYSICIAN:Raphael Fitzpatrick [name], MD  OPERATIVE REPORT  DATE OF PROCEDURE:  04/13/2018  PREOPERATIVE DIAGNOSES:   1.  Right below-knee amputation stump dehiscence. 2.  Left below-knee amputation stump necrosis, infection and gangrene.  POSTOPERATIVE DIAGNOSIS:   1.  Right below-knee amputation stump dehiscence. 2.  Left below-knee amputation stump necrosis, infection and gangrene.   PROCEDURE:  1. Right below-knee amputation  wound washout and skin and soft tissue debridement.   2.  Left below-knee amputation skin excision soft tissue debridement, washout and placement of wound vacuum assisted closure device.  SURGEON:  Jamesetta So, MD.  ANESTHESIA:  General endotracheal anesthesia.  INDICATIONS:  The patient is a 67 year old lady with end-stage renal disease on hemodialysis, bilateral BK amputations that presented after falling out of her wheelchair, landing on her right BKA stump, which subsequently bled and dehisced.  Upon  evaluation, it was noted that the left BKA stump also had necrosis and gangrene.  Therefore, she was taken to the operating room for revision and debridement.  DESCRIPTION OF PROCEDURE:  After informed consent was obtained, the patient was taken to the operating room laid supine on the operating table.  Bilateral BKA stumps were prepped and draped in the usual sterile fashion.  The patient did receive  preoperative antibiotics and a timeout was performed.  I turned my attention to the right stump initially.  The lateral portion of the stump had dehisced.  Only be skin had dehisced.  The deeper tissues were still intact.  There was no drainage or  further bleeding.  The wound was copiously irrigated with Pulsavac with antibiotic irrigation.  I then reclosed the wound with a 2-0 Vicryl and 3-0 nylon in a mattress fashion.  I then turned my  attention to the left stump.  The skin over the entire  incision was significantly necrotic.  I excised this with a #15 blade and the superficial soft tissues also had some evidence of infection.  I did send cultures.  The deeper tissues were intact from the prior sutures.  There was no drainage or evidence  of deeper infection.  The tissue was actually quite healthy and bled.  I copiously irrigated with the Pulsavac the wound and ensured all the necrotic skin and soft tissue was removed.  I ensured I had hemostasis and the wound was covered with a wound VAC  and suction was placed.  The patient tolerated the procedure well.  A Xeroform and dry sterile dressing were placed and the stumps were wrapped and Kerlix and Xeroform.    The patient tolerated the procedure well and was transferred to the recovery room in good condition.  AN/NUANCE  D:04/13/2018 T:04/13/2018 JOB:[id number]

## 2018-04-13 NOTE — ED Provider Notes (Signed)
[facility] Emergency Department Provider Note  ____________________________________________   First MD Initiated Contact with Patient 04/13/18 [phone]     (approximate)  I have reviewed the triage vital signs and the nursing notes.   HISTORY  Chief Complaint Fall   HPI Brier Firebaugh is a 67 y.o. 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  The patient was transferring to her wheelchair in order to get breakfast when she fell, landing on the stump of the right BKA.  The wound dehisced.  Was wrapped with bandages and EMS reports that the bleeding has been controlled.  Patient is on Eliquis.  Patient denying any pain at this time.  Denies hitting her head.  Denies any pain to her buttocks or low back.   Past Medical History:  Diagnosis Date  . (HFpEF) heart failure with preserved ejection fraction ([place])    a. 01/2018 Echo: EF 50-55%, no rwma, Gr1 DD, triv AI, mild MR. Midly dil LA/RV, mod reduced RV fxn. Irreg thickening of TV w/ mobile echodensity that appears to arise from valve.  . Anemia   . Anorexia   . Bacteremia due to Pseudomonas   . Carotid arterial disease ([place])    a. 01/2017 Carotid U/S: RICA <67, LICA <61.  Marland Kitchen Complication of anesthesia    a. Pt reports h/o complication on 9 different occasions - ? hypotension/arrest.  . 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 01/2015 Cath [facility]): 70% LM, p-mLAD 50-60% (Resting FFR 0.75), mRCA 80-90%, ~40 Ost OM & D1-->CABG; b. 01/2016 Staged PCI of LCX x 2 and RCA.  Marland Kitchen ESRD (end stage renal disease) on dialysis ([place])    a. ESRD secondary to acute kidney failure s/p CABG-->PD  . Essential hypertension   . GERD (gastroesophageal reflux disease)   . Heart murmur   . Hypercholesterolemia   . Myocardial infarction ([place]) 2016  . PVD (peripheral vascular disease) ([place])    a. 01/30/2018 PV Angio: Sev  Left Popliteal dzs s/p PTA w/ 67m lutonix DEB w/ mech aspiration of the L popliteal, L AT, and Left tibioperoneal trunck and peroneal artery.  . 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  [facility]: Dr. BMarland KitchenHaithcock: CABG x 3, LIMA to LAD, SVG to RCA, SVG to OM3, EVH  . Sinusitis 2019  . Type II diabetes mellitus with complication ([facility]    CAD    Patient Active Problem List   Diagnosis Date Noted  . Unilateral complete BKA, right, initial encounter ([place] 03/30/2018  . Transient loss of consciousness 03/19/2018  . Transient alteration of awareness   . VT (ventricular tachycardia) ([place]   . Disorientation   . Hypertensive urgency 03/08/2018  . Ischemia of extremity 02/18/2018  . ESRD (end stage renal disease) ([place]   . Diabetic osteomyelitis ([place]   . Peripheral vascular disease ([place]   . Advanced care planning/counseling discussion   . Palliative care by specialist   . Goals of care, counseling/discussion   . Diabetes mellitus, type 2 ([place] 01/30/2018  . Sepsis ([place] 01/29/2018  . Peripheral neuropathy 06/17/2017  . Encephalopathy 02/06/2017  . Hypertensive emergency 02/06/2017  . Endophthalmitis, left eye 12/05/2016  . Vomiting 10/24/2016  . Hematuria 10/17/2016  . Acute lower UTI 10/17/2016  . Anesthesia complication 095/12/3265 . Chest pain with moderate risk for cardiac etiology 09/04/2016  . Steal syndrome dialysis vascular access, initial encounter ([place] 06/18/2016  .  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 Coronary artery disease involving coronary bypass graft of native heart with angina pectoris ([place]) 02/10/2016  . Hypertension 02/02/2016  . Hyperlipidemia 02/02/2016  . Coronary artery disease involving left main coronary artery 10/04/2015  . Abdominal pain of unknown etiology 10/04/2015  . ESRD (end stage renal disease) on dialysis ([place])   . Type II diabetes mellitus with complication ([place])   . NSTEMI (non-ST elevated myocardial infarction) ([place])   .  Right sided abdominal pain   . Elevated troponin 10/03/2015  . 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 Chronic diastolic congestive heart failure ([place]) 04/19/2014  . Stage 5 chronic kidney disease on chronic dialysis ([place]) 04/19/2014  . Anemia 09/20/2013  . Routine health maintenance 01/29/2013    Past Surgical History:  Procedure Laterality Date  . ABDOMINAL HYSTERECTOMY    . AMPUTATION Right 02/25/2018   Procedure: AMPUTATION BELOW KNEE;  Surgeon: Katha Cabal, MD;  Location: [facility] ORS;  Service: Vascular;  Laterality: Right;  . AMPUTATION Left 03/11/2018   Procedure: AMPUTATION BELOW KNEE;  Surgeon: Algernon Huxley, MD;  Location: [facility] ORS;  Service: Vascular;  Laterality: Left;  . AMPUTATION TOE Left 02/06/2018   Procedure: AMPUTATION GREAT TOE;  Surgeon: Samara Deist, DPM;  Location: [facility] ORS;  Service: Podiatry;  Laterality: Left;  . ARTERIOVENOUS GRAFT PLACEMENT  05/2016  . AV FISTULA PLACEMENT Left 05/30/2016   Procedure: ARTERIOVENOUS graft;  Surgeon: Algernon Huxley, MD;  Location: [facility] ORS;  Service: Vascular;  Laterality: Left;  . CAPD INSERTION N/A 10/30/2017   Procedure: LAPAROSCOPIC INSERTION CONTINUOUS AMBULATORY PERITONEAL DIALYSIS  (CAPD) CATHETER;  Surgeon: Algernon Huxley, MD;  Location: [facility] ORS;  Service: Vascular;  Laterality: N/A;  . CARDIAC CATHETERIZATION  01/2015   [facility]: Ost LM 70%, p-m LAD 50-60% (Rest FFR + @ 0.75), mRCA 80-90%, ostD1 40%, pOM1 40%  . CATARACT EXTRACTION W/PHACO Left 01/18/2016   Procedure: CATARACT EXTRACTION PHACO AND INTRAOCULAR LENS PLACEMENT (IOC);  Surgeon: Eulogio Bear, MD;  Location: [facility] ORS;  Service: Ophthalmology;  Laterality: Left;  [place] 1.05 AP% 15.5 CDE 10.16 Fluid Pack Lot # [id number]  . CATARACT EXTRACTION W/PHACO Right 08/01/2016   Procedure: CATARACT EXTRACTION PHACO AND INTRAOCULAR LENS PLACEMENT (IOC);  Surgeon: Eulogio Bear, MD;  Location: [facility] ORS;  Service: Ophthalmology;  Laterality: Right;  [place] 01:00.6 AP% 11.4 CDE 6.93  LOT #  [id number]  . COLONOSCOPY    . CORONARY ANGIOPLASTY     SENTS 02/12/16  . CORONARY ARTERY BYPASS GRAFT  03/28/15    [facility]: Dr. Waldemar Dickens: LIMA to LAD, SVG to RCA, SVG to OM3, EVH  . DIALYSIS/PERMA CATHETER INSERTION N/A 02/11/2018   Procedure: DIALYSIS/PERMA CATHETER INSERTION;  Surgeon: Algernon Huxley, MD;  Location: [place] CV LAB;  Service: Cardiovascular;  Laterality: N/A;  . DIALYSIS/PERMA CATHETER REMOVAL Right 02/04/2018   Procedure: DIALYSIS/PERMA CATHETER REMOVAL;  Surgeon: Katha Cabal, MD;  Location: [place] [facility];  Service: Cardiovascular;  Laterality: Right;  . ESOPHAGOGASTRODUODENOSCOPY (EGD) WITH PROPOFOL N/A 10/24/2016   Procedure: ESOPHAGOGASTRODUODENOSCOPY (EGD) WITH PROPOFOL;  Surgeon: [name], MD;  Location: [facility];  Service: Endoscopy;  Laterality: N/A;  . EYE SURGERY Bilateral    cataract surgery  . EYE SURGERY     drains for glaucoma  . INSERTION EXPRESS TUBE SHUNT Right 08/01/2016   Procedure: INSERTION EXPRESS TUBE SHUNT;  Surgeon: Eulogio Bear, MD;  Location: [facility];  Service: Ophthalmology;  Laterality: Right;  . INSERTION  OF AHMED VALVE Left 08/15/2016   Procedure: INSERTION OF AHMED VALVE;  Surgeon: Eulogio Bear, MD;  Location: [facility];  Service: Ophthalmology;  Laterality: Left;  . INSERTION OF DIALYSIS CATHETER    . LOWER EXTREMITY ANGIOGRAPHY Right 02/19/2018   Procedure: Lower Extremity Angiography;  Surgeon: Algernon Huxley, MD;  Location: [place] [facility];  Service: Cardiovascular;  Laterality: Right;  . LOWER EXTREMITY ANGIOGRAPHY Left 01/30/2018   Procedure: Lower Extremity Angiography;  Surgeon: Katha Cabal, MD;  Location: [place] [facility];  Service: Cardiovascular;  Laterality: Left;  . REMOVAL OF A DIALYSIS CATHETER N/A 02/25/2018   Procedure: REMOVAL OF A DIALYSIS CATHETER;  Surgeon: Katha Cabal, MD;  Location: [facility];  Service: Vascular;  Laterality: N/A;  . TEMPORARY DIALYSIS CATHETER N/A  02/06/2018   Procedure: TEMPORARY DIALYSIS CATHETER;  Surgeon: Katha Cabal, MD;  Location: [place] [facility];  Service: Cardiovascular;  Laterality: N/A;  . TRANSTHORACIC ECHOCARDIOGRAM  January 2017    EF 60-65%. GR 2 DD. Mild degenerative mitral valve disease but no prolapse or regurgitation. Mild left atrial dilation. Mild to moderate LVH. Pericardial effusion gone  . UPPER EXTREMITY ANGIOGRAPHY Left 06/27/2016   Procedure: Upper Extremity Angiography;  Surgeon: Algernon Huxley, MD;  Location: [place] [facility];  Service: Cardiovascular;  Laterality: Left;  . UPPER EXTREMITY ANGIOGRAPHY Left 08/22/2016   Procedure: Upper Extremity Angiography;  Surgeon: Algernon Huxley, MD;  Location: [place] [facility];  Service: Cardiovascular;  Laterality: Left;  Marland Kitchen VASCULAR SURGERY      Prior to Admission medications   Medication Sig Start Date End Date Taking? Authorizing Provider  acetaminophen (TYLENOL) 325 MG tablet Take 2 tablets (650 mg total) by mouth every 6 (six) hours as needed for mild pain (or Fever >/= 101). 03/23/18   [name], MD  apixaban (ELIQUIS) 2.5 MG TABS tablet Take 1 tablet (2.5 mg total) by mouth 2 (two) times daily. 02/15/18   Vaughan Basta, MD  aspirin EC 81 MG tablet Take 1 tablet (81 mg total) by mouth daily. 10/19/16   Dustin Flock, MD  atorvastatin (LIPITOR) 80 MG tablet Take 80 mg by mouth at bedtime.  05/05/17   [provider]  brimonidine (ALPHAGAN) 0.2 % ophthalmic solution Place 1 drop into both eyes 3 (three) times daily. 03/18/18   Bettey Costa, MD  calcitRIOL (ROCALTROL) 0.5 MCG capsule Take 0.5 mcg by mouth daily.    [provider]  carvedilol (COREG) 6.25 MG tablet Take 1 tablet (6.25 mg total) by mouth 2 (two) times daily. 03/18/18   Bettey Costa, MD  docusate sodium (COLACE) 100 MG capsule Take 1 capsule (100 mg total) by mouth 2 (two) times daily. 02/15/18   Vaughan Basta, MD  doxycycline (VIBRAMYCIN) 100 MG capsule Take  1 capsule (100 mg total) by mouth 2 (two) times daily. 03/30/18   Kris Hartmann, NP  epoetin alfa (EPOGEN,PROCRIT) 93810 UNIT/ML injection Inject 1 mL (10,000 Units total) into the vein Every Tuesday,Thursday,and Saturday with dialysis. 03/24/18   [name], MD  folic acid (FOLVITE) 1 MG tablet Take 1 tablet (1 mg total) by mouth daily. 02/15/18 02/15/19  Vaughan Basta, MD  isosorbide mononitrate (IMDUR) 30 MG 24 hr tablet Take 30 mg by mouth daily.     [provider]  levETIRAcetam (KEPPRA) 250 MG tablet Take 1 tablet (250 mg total) by mouth 3 (three) times a week. 03/25/18   [name], MD  levETIRAcetam (KEPPRA) 500  MG tablet Take 1 tablet (500 mg total) by mouth 2 (two) times daily. 03/23/18   Gouru, Illene Silver, MD  midodrine (PROAMATINE) 10 MG tablet Take 1 tablet (10 mg total) by mouth 3 (three) times a week. Patient taking differently: Take 10 mg by mouth 3 (three) times a week. Take Tuesday, Thursday, and Saturday 02/17/18   Vaughan Basta, MD  mirtazapine (REMERON) 7.5 MG tablet Take by mouth. 12/18/17   [provider]  Multiple Vitamin (MULTIVITAMIN WITH MINERALS) TABS tablet Take 2 tablets by mouth daily. Gummy vitamins    [provider]  Multiple Vitamins-Minerals (PRORENAL + D) TABS Take 1 tablet by mouth daily.    [provider]  nitroGLYCERIN (NITROSTAT) 0.4 MG SL tablet Place 1 tablet (0.4 mg total) under the tongue every 5 (five) minutes as needed for chest pain. 09/05/16   Loletha Grayer, MD  nizatidine (AXID) 150 MG capsule Take 1 capsule (150 mg total) by mouth 2 (two) times daily. 02/15/18   Vaughan Basta, MD  Nutritional Supplements (FEEDING SUPPLEMENT, NEPRO CARB STEADY,) LIQD Take 237 mLs by mouth 2 (two) times daily between meals. 02/15/18   Vaughan Basta, MD  Omega-3 300 MG CAPS Take 2 capsules by mouth daily.    [provider]  ondansetron (ZOFRAN) 4 MG tablet Take 4 mg by mouth every 8  (eight) hours as needed for nausea or vomiting.    [provider]  Polyethyl Glycol-Propyl Glycol 0.4-0.3 % SOLN Place 1 drop into both eyes 2 (two) times daily.    [provider]  rOPINIRole (REQUIP) 0.25 MG tablet Take by mouth. 12/02/16   [provider]  sevelamer carbonate (RENVELA) 800 MG tablet Take 800 mg by mouth 3 (three) times daily with meals.    [provider]  silver sulfADIAZINE (SILVADENE) 1 % cream Apply 1 application topically daily. 03/30/18   Kris Hartmann, NP  timolol (TIMOPTIC) 0.5 % ophthalmic solution Place 1 drop into both eyes 3 (three) times daily. 03/18/18   Bettey Costa, MD  traMADol (ULTRAM) 50 MG tablet Take 1 tablet (50 mg total) by mouth every 8 (eight) hours as needed for moderate pain or severe pain. 03/23/18   Gouru, Illene Silver, MD  valsartan (DIOVAN) 80 MG tablet Take by mouth. 12/03/17 12/03/18  [provider]  vitamin B-12 (CYANOCOBALAMIN) 1000 MCG tablet Take 1 tablet (1,000 mcg total) by mouth daily. 02/15/18   Vaughan Basta, MD    Allergies Chlorthalidone; Fentanyl; Midazolam; Ace inhibitors; Angiotensin receptor blockers; Norvasc [amlodipine]; and Phenergan [promethazine hcl]  Family History  Problem Relation Age of Onset  . Diabetes Mellitus II Mother   . Heart failure Mother   . Pancreatic cancer Father     Social History Social History   Tobacco Use  . Smoking status: Never Smoker  . Smokeless tobacco: Never Used  Substance Use Topics  . Alcohol use: No  . Drug use: No    Review of Systems  Constitutional: No fever/chills Eyes: No visual changes. ENT: No sore throat. Cardiovascular: Denies chest pain. Respiratory: Denies shortness of breath. Gastrointestinal: No abdominal pain.  No nausea, no vomiting.  No diarrhea.  No constipation. Genitourinary: Negative for dysuria. Musculoskeletal: Negative for back pain. Skin: Negative for rash. Neurological: Negative for headaches, focal  weakness or numbness.   ____________________________________________   PHYSICAL EXAM:  VITAL SIGNS: ED Triage Vitals  Enc Vitals Group     BP 04/13/18 0807 (!) 204/76     Pulse Rate 04/13/18 0808 62  Resp --      Temp 04/13/18 0808 97.9 F (36.6 C)     Temp Source 04/13/18 0808 Oral     SpO2 04/13/18 0806 94 %     Weight 04/13/18 0809 120 lb (54.4 kg)     Height 04/13/18 0809 5' (1.524 m)     Head Circumference --      Peak Flow --      Pain Score 04/13/18 0809 0     Pain Loc --      Pain Edu? --      Excl. in [place]? --     Constitutional: Alert and oriented. Well appearing and in no acute distress. Eyes: Conjunctivae are normal.  Head: Atraumatic. Nose: No congestion/rhinnorhea. Mouth/Throat: Mucous membranes are moist.  Neck: No stridor.   Cardiovascular: Normal rate, regular rhythm. Grossly normal heart sounds.   Respiratory: Normal respiratory effort.  No retractions. Lungs CTAB. Gastrointestinal: Soft and nontender. No distention.  Musculoskeletal:   No tenderness to palpation along the thoracic nor lumbar spines.  No deformity or step-off.  Right-sided BKA with dehiscence of approximately 8 cm without active bleeding.  Underlying sutures as well as adipose are visualized.  Unable to visualize the bone.  No active bleeding at this time.  Left-sided BKA is intact with staples still in place.  However, unhealthy tissue at the incision site that is yellow and dusky.  No pus visualized.  However, there is also a black eschar medially.  Neurologic:  Normal speech and language. No gross focal neurologic deficits are appreciated. Skin:  Skin is warm, dry and intact. No rash noted. Psychiatric: Mood and affect are normal. Speech and behavior are normal.  ____________________________________________   LABS (all labs ordered are listed, but only abnormal results are displayed)  Labs Reviewed - No data to  display ____________________________________________  EKG   ____________________________________________  RADIOLOGY   ____________________________________________   PROCEDURES  Procedure(s) performed:   Procedures  Critical Care performed:   ____________________________________________   INITIAL IMPRESSION / ASSESSMENT AND PLAN / ED COURSE  Pertinent labs & imaging results that were available during my care of the patient were reviewed by me and considered in my medical decision making (see chart for details).  DDX: Wound dehiscence, cellulitis, hemorrhage As part of my medical decision making, I reviewed the following data within the electronic MEDICAL RECORD NUMBER Notes from prior ED visits  ----------------------------------------- 8:32 AM on 04/13/2018 -----------------------------------------  Discussed the case with Dr. Lorenso Courier of vascular surgery.  She says that she will examine the wound and likely wrap it.  Patient likely be discharged SNF.  ----------------------------------------- 8:51 AM on 04/13/2018 -----------------------------------------  Dr. Lorenso Courier is that the bedside and says that she will be taken the patient to the operating room for washout of the right side and then revision of the wound on the left.  Requests medicine admission.  Signed out to Dr. Anselm Jungling.  Patient aware of diagnosis well treatment and willing to comply. ____________________________________________   FINAL CLINICAL IMPRESSION(S) / ED DIAGNOSES  Wound dehiscence.  NEW MEDICATIONS STARTED DURING THIS VISIT:  New Prescriptions   No medications on file     Note:  This document was prepared using Dragon voice recognition software and may include unintentional dictation errors.     Orbie Pyo, MD 04/13/18 [id number]

## 2018-04-13 NOTE — Consult Note (Signed)
Reason for Consult:Bilateral BKA- Right- Dehiscence, LEFT- necrosis. Referring Physician: ED  Layali Freund is an 67 y.o. female.  HPI: Patient HTN, DM, ESRD. 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 She fell this morning while transferring landing on her RIGHT stump- subsequent dehiscence. Mild bleeding. She denies pain. The left stump has developed necrosis and fibrinous debris. Otherwise without complaint  Past Medical History:  Diagnosis Date  . (HFpEF) heart failure with preserved ejection fraction ([place])    a. 01/2018 Echo: EF 50-55%, no rwma, Gr1 DD, triv AI, mild MR. Midly dil LA/RV, mod reduced RV fxn. Irreg thickening of TV w/ mobile echodensity that appears to arise from valve.  . Anemia   . Anorexia   . Bacteremia due to Pseudomonas   . Carotid arterial disease ([place])    a. 01/2017 Carotid U/S: RICA <06, LICA <23.  Marland Kitchen Complication of anesthesia    a. Pt reports h/o complication on 9 different occasions - ? hypotension/arrest.  . 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 01/2015 Cath [facility]): 70% LM, p-mLAD 50-60% (Resting FFR 0.75), mRCA 80-90%, ~40 Ost OM & D1-->CABG; b. 01/2016 Staged PCI of LCX x 2 and RCA.  Marland Kitchen ESRD (end stage renal disease) on dialysis ([place])    a. ESRD secondary to acute kidney failure s/p CABG-->PD  . Essential hypertension   . GERD (gastroesophageal reflux disease)   . Heart murmur   . Hypercholesterolemia   . Myocardial infarction ([place]) 2016  . PVD (peripheral vascular disease) ([place])    a. 01/30/2018 PV Angio: Sev Left Popliteal dzs s/p PTA w/ 73m lutonix DEB w/ mech aspiration of the L popliteal, L AT, and Left tibioperoneal trunck and peroneal artery.  . 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  [facility]: Dr. BMarland KitchenHaithcock: CABG x 3, LIMA to LAD, SVG to RCA, SVG to OM3, EVH  . Sinusitis 2019  . Type II diabetes mellitus with complication (HCC)    CAD    Past Surgical History:  Procedure Laterality Date  . ABDOMINAL HYSTERECTOMY    .  AMPUTATION Right 02/25/2018   Procedure: AMPUTATION BELOW KNEE;  Surgeon: SKatha Cabal MD;  Location: [facility];  Service: Vascular;  Laterality: Right;  . AMPUTATION Left 03/11/2018   Procedure: AMPUTATION BELOW KNEE;  Surgeon: DAlgernon Huxley MD;  Location: [facility];  Service: Vascular;  Laterality: Left;  . AMPUTATION TOE Left 02/06/2018   Procedure: AMPUTATION GREAT TOE;  Surgeon: FSamara Deist DPM;  Location: [facility];  Service: Podiatry;  Laterality: Left;  . ARTERIOVENOUS GRAFT PLACEMENT  05/2016  . AV FISTULA PLACEMENT Left 05/30/2016   Procedure: ARTERIOVENOUS graft;  Surgeon: JAlgernon Huxley MD;  Location: [facility];  Service: Vascular;  Laterality: Left;  . CAPD INSERTION N/A 10/30/2017   Procedure: LAPAROSCOPIC INSERTION CONTINUOUS AMBULATORY PERITONEAL DIALYSIS  (CAPD) CATHETER;  Surgeon: DAlgernon Huxley MD;  Location: [facility];  Service: Vascular;  Laterality: N/A;  . CARDIAC CATHETERIZATION  01/2015   [facility]: Ost LM 70%, p-m LAD 50-60% (Rest FFR + @ 0.75), mRCA 80-90%, ostD1 40%, pOM1 40%  . CATARACT EXTRACTION W/PHACO Left 01/18/2016   Procedure: CATARACT EXTRACTION PHACO AND INTRAOCULAR LENS PLACEMENT (IOC);  Surgeon: BEulogio Bear MD;  Location: [facility];  Service: Ophthalmology;  Laterality: Left;  [place]1.05 AP% 15.5 CDE 10.16 Fluid Pack Lot # [id number]  . CATARACT EXTRACTION W/PHACO Right 08/01/2016   Procedure: CATARACT EXTRACTION PHACO AND INTRAOCULAR LENS PLACEMENT (IOC);  Surgeon: BLeory Plowman  Alma Friendly, MD;  Location: [facility];  Service: Ophthalmology;  Laterality: Right;  [place] 01:00.6 AP% 11.4 CDE 6.93  LOT # [id number]  . COLONOSCOPY    . CORONARY ANGIOPLASTY     SENTS 02/12/16  . CORONARY ARTERY BYPASS GRAFT  03/28/15    [facility]: Dr. Waldemar Dickens: LIMA to LAD, SVG to RCA, SVG to OM3, EVH  . DIALYSIS/PERMA CATHETER INSERTION N/A 02/11/2018   Procedure: DIALYSIS/PERMA CATHETER INSERTION;  Surgeon: Algernon Huxley, MD;  Location: [place] [facility];  Service: Cardiovascular;   Laterality: N/A;  . DIALYSIS/PERMA CATHETER REMOVAL Right 02/04/2018   Procedure: DIALYSIS/PERMA CATHETER REMOVAL;  Surgeon: Katha Cabal, MD;  Location: [place] [facility];  Service: Cardiovascular;  Laterality: Right;  . ESOPHAGOGASTRODUODENOSCOPY (EGD) WITH PROPOFOL N/A 10/24/2016   Procedure: ESOPHAGOGASTRODUODENOSCOPY (EGD) WITH PROPOFOL;  Surgeon: [name], MD;  Location: [facility];  Service: Endoscopy;  Laterality: N/A;  . EYE SURGERY Bilateral    cataract surgery  . EYE SURGERY     drains for glaucoma  . INSERTION EXPRESS TUBE SHUNT Right 08/01/2016   Procedure: INSERTION EXPRESS TUBE SHUNT;  Surgeon: Eulogio Bear, MD;  Location: [facility];  Service: Ophthalmology;  Laterality: Right;  . INSERTION OF AHMED VALVE Left 08/15/2016   Procedure: INSERTION OF AHMED VALVE;  Surgeon: Eulogio Bear, MD;  Location: [facility];  Service: Ophthalmology;  Laterality: Left;  . INSERTION OF DIALYSIS CATHETER    . LOWER EXTREMITY ANGIOGRAPHY Right 02/19/2018   Procedure: Lower Extremity Angiography;  Surgeon: Algernon Huxley, MD;  Location: [place] [facility];  Service: Cardiovascular;  Laterality: Right;  . LOWER EXTREMITY ANGIOGRAPHY Left 01/30/2018   Procedure: Lower Extremity Angiography;  Surgeon: Katha Cabal, MD;  Location: [place] [facility];  Service: Cardiovascular;  Laterality: Left;  . REMOVAL OF A DIALYSIS CATHETER N/A 02/25/2018   Procedure: REMOVAL OF A DIALYSIS CATHETER;  Surgeon: Katha Cabal, MD;  Location: [facility];  Service: Vascular;  Laterality: N/A;  . TEMPORARY DIALYSIS CATHETER N/A 02/06/2018   Procedure: TEMPORARY DIALYSIS CATHETER;  Surgeon: Katha Cabal, MD;  Location: [place] [facility];  Service: Cardiovascular;  Laterality: N/A;  . TRANSTHORACIC ECHOCARDIOGRAM  January 2017    EF 60-65%. GR 2 DD. Mild degenerative mitral valve disease but no prolapse or regurgitation. Mild left atrial dilation. Mild to moderate LVH. Pericardial  effusion gone  . UPPER EXTREMITY ANGIOGRAPHY Left 06/27/2016   Procedure: Upper Extremity Angiography;  Surgeon: Algernon Huxley, MD;  Location: [place] [facility];  Service: Cardiovascular;  Laterality: Left;  . UPPER EXTREMITY ANGIOGRAPHY Left 08/22/2016   Procedure: Upper Extremity Angiography;  Surgeon: Algernon Huxley, MD;  Location: [place] [facility];  Service: Cardiovascular;  Laterality: Left;  Marland Kitchen VASCULAR SURGERY      Family History  Problem Relation Age of Onset  . Diabetes Mellitus II Mother   . Heart failure Mother   . Pancreatic cancer Father     Social History:  reports that she has never smoked. She has never used smokeless tobacco. She reports that she does not drink alcohol or use drugs.  Allergies:  Allergies  Allergen Reactions  . Chlorthalidone Anaphylaxis, Itching and Rash  . Fentanyl Rash  . Midazolam Rash  . Ace Inhibitors Other (See Comments)    Reaction:  Hyperkalemia, agitation   . Angiotensin Receptor Blockers Other (See Comments)    Reaction:  Hyperkalemia, agitation   . Norvasc [Amlodipine] Itching and Rash  . Phenergan [  Promethazine Hcl] Anxiety    "antsy, can't sit still"    Medications: I have reviewed the patient's current medications.  No results found for this or any previous visit (from the past 48 hour(s)).  No results found.  Review of Systems  Constitutional: Negative.   Respiratory: Negative.  Negative for cough.   Cardiovascular: Negative for chest pain.  Gastrointestinal: Negative for abdominal pain and vomiting.  Neurological: Negative.    Blood pressure (!) 204/76, pulse 62, temperature 97.9 F (36.6 C), temperature source Oral, height 5' (1.524 m), weight 54.4 kg, SpO2 94 %. Physical Exam  Nursing note and vitals reviewed. Constitutional: She is oriented to person, place, and time. She appears well-developed.  HENT:  Head: Normocephalic.  Neck: Normal range of motion.  Cardiovascular: Normal rate and regular rhythm.   Respiratory: Effort normal and breath sounds normal.  GI: Soft. Bowel sounds are normal. There is no abdominal tenderness.  Musculoskeletal:     Comments: RIGHT BKA: dehiscence on lateral margin, soft, no bleeding, hematoma. LEFT BKA: necrosis, fibrinous debris, serous drainage.  Neurological: She is alert and oriented to person, place, and time.  Skin: Skin is warm.    Assessment/Plan: Will plan for bilateral BKA washout/revisions/wound VAC today. Discussed with patient and family.  Fredrick Geoghegan A 04/13/2018, 8:51 AM

## 2018-04-13 NOTE — Anesthesia Procedure Notes (Signed)
Procedure Name: LMA Insertion Date/Time: 04/13/2018 12:04 PM Performed by: Lesle Reek, CRNA Pre-anesthesia Checklist: Patient identified, Emergency Drugs available, Suction available, Patient being monitored and Timeout performed Patient Re-evaluated:Patient Re-evaluated prior to induction Oxygen Delivery Method: Circle system utilized Preoxygenation: Pre-oxygenation with 100% oxygen Induction Type: IV induction LMA: LMA inserted LMA Size: 4.0 Number of attempts: 1 Placement Confirmation: positive ETCO2,  CO2 detector and breath sounds checked- equal and bilateral Tube secured with: Tape

## 2018-04-13 NOTE — Anesthesia Post-op Follow-up Note (Signed)
Anesthesia QCDR form completed.        

## 2018-04-13 NOTE — Brief Op Note (Signed)
04/13/2018  1:58 PM  Dictated # [id number]  PATIENT:  Stephanie Ellis  67 y.o. female  PRE-OPERATIVE DIAGNOSIS:  RIGHT BKA Dehiscence; LEFT BKA Necrosis/infection  POST-OPERATIVE DIAGNOSIS:  RIGHT BKA Dehiscence; LEFT BKA Necrosis/infection   PROCEDURE:  Procedure(s): REVISION OF BILATERAL AMPUTATIONS BELOW KNEE WITH WOUND VAC APPLICATIONS (Bilateral) APPLICATION OF WOUND VAC (Left)  SURGEON:  Surgeon(s) and Role:    * Esco, Miechia A, MD - Primary  PHYSICIAN ASSISTANT:   ASSISTANTS: none   ANESTHESIA:   general  EBL:  47m   BLOOD ADMINISTERED:none  DRAINS: WOUND VAC LEFT BKA   LOCAL MEDICATIONS USED:  NONE  SPECIMEN:  Source of Specimen:  WOUND CULTURE LEFT BKA  DISPOSITION OF SPECIMEN:  MICROBIOLOGY  COUNTS:  YES  TOURNIQUET:  * No tourniquets in log *  DICTATION: .Other Dictation: Dictation Number [id number] PLAN OF CARE: Admit to inpatient   PATIENT DISPOSITION:  MEDICAL FLOOR   Delay start of Pharmacological VTE agent (>24hrs) due to surgical blood loss or risk of bleeding: no

## 2018-04-13 NOTE — ED Notes (Signed)
Pt c/o pain in L lower extremity. Lorrie,RN made aware.

## 2018-04-13 NOTE — Anesthesia Procedure Notes (Signed)
Date/Time: 04/13/2018 12:34 PM Performed by: Lesle Reek, CRNA

## 2018-04-13 NOTE — H&P (Signed)
[facility] [place] NAME: Stephanie Ellis    MR#:  [id number]  DATE OF BIRTH:  [birth year]  DATE OF ADMISSION:  04/13/2018  PRIMARY CARE PHYSICIAN: Hanley Ben, NP   REQUESTING/REFERRING PHYSICIAN: Dr. Clearnce Hasten  CHIEF COMPLAINT:   Fall today at the [facility]:  Stephanie Ellis  is a 67 y.o. female with a known history of end-stage renal disease on hemodialysis, chronic anemia, bilateral below knee amputation due to severe peripheral vascular disease, carotid artery disease, hypertension comes to the emergency room from the [facility] of elements after patient fell while trying to slide on the board from her bed to the wheelchair. She started noticing some bleeding from the right amputation stump. The wound is dehisced. Patient is on eliquis. She denies any pain at this time. Denies any unconsciousness or hitting her head. Daughter in the ER.  Patient was seen by vascular surgery Dr. Lorenso Courier and she will need debridement/wash out with possible wound VAC. Surgery scheduled for noon.  Patient is being admitted for further evaluation of management.  Patient did get one unit of blood transfusion on Friday.  PAST MEDICAL HISTORY:   Past Medical History:  Diagnosis Date  . (HFpEF) heart failure with preserved ejection fraction ([place])    a. 01/2018 Echo: EF 50-55%, no rwma, Gr1 DD, triv AI, mild MR. Midly dil LA/RV, mod reduced RV fxn. Irreg thickening of TV w/ mobile echodensity that appears to arise from valve.  . Anemia   . Anorexia   . Bacteremia due to Pseudomonas   . Carotid arterial disease ([place])    a. 01/2017 Carotid U/S: RICA <94, LICA <49.  Marland Kitchen Complication of anesthesia    a. Pt reports h/o complication on 9 different occasions - ? hypotension/arrest.  . 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 01/2015 Cath [facility]): 70% LM, p-mLAD 50-60% (Resting FFR 0.75), mRCA 80-90%, ~40  Ost OM & D1-->CABG; b. 01/2016 Staged PCI of LCX x 2 and RCA.  Marland Kitchen ESRD (end stage renal disease) on dialysis ([place])    a. ESRD secondary to acute kidney failure s/p CABG-->PD  . Essential hypertension   . GERD (gastroesophageal reflux disease)   . Heart murmur   . Hypercholesterolemia   . Myocardial infarction ([place]) 2016  . PVD (peripheral vascular disease) ([place])    a. 01/30/2018 PV Angio: Sev Left Popliteal dzs s/p PTA w/ 54m lutonix DEB w/ mech aspiration of the L popliteal, L AT, and Left tibioperoneal trunck and peroneal artery.  . 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  [facility]: Dr. BMarland KitchenHaithcock: CABG x 3, LIMA to LAD, SVG to RCA, SVG to OM3, EVH  . Sinusitis 2019  . Type II diabetes mellitus with complication (HCC)    CAD    PAST SURGICAL HISTOIRY:   Past Surgical History:  Procedure Laterality Date  . ABDOMINAL HYSTERECTOMY    . AMPUTATION Right 02/25/2018   Procedure: AMPUTATION BELOW KNEE;  Surgeon: SKatha Cabal MD;  Location: [facility] ORS;  Service: Vascular;  Laterality: Right;  . AMPUTATION Left 03/11/2018   Procedure: AMPUTATION BELOW KNEE;  Surgeon: DAlgernon Huxley MD;  Location: [facility] ORS;  Service: Vascular;  Laterality: Left;  . AMPUTATION TOE Left 02/06/2018   Procedure: AMPUTATION GREAT TOE;  Surgeon: FSamara Deist DPM;  Location: [facility] ORS;  Service: Podiatry;  Laterality: Left;  . ARTERIOVENOUS GRAFT PLACEMENT  05/2016  .  AV FISTULA PLACEMENT Left 05/30/2016   Procedure: ARTERIOVENOUS graft;  Surgeon: Algernon Huxley, MD;  Location: [facility] ORS;  Service: Vascular;  Laterality: Left;  . CAPD INSERTION N/A 10/30/2017   Procedure: LAPAROSCOPIC INSERTION CONTINUOUS AMBULATORY PERITONEAL DIALYSIS  (CAPD) CATHETER;  Surgeon: Algernon Huxley, MD;  Location: [facility] ORS;  Service: Vascular;  Laterality: N/A;  . CARDIAC CATHETERIZATION  01/2015   [facility]: Ost LM 70%, p-m LAD 50-60% (Rest FFR + @ 0.75), mRCA 80-90%, ostD1 40%, pOM1 40%  . CATARACT EXTRACTION W/PHACO Left 01/18/2016   Procedure:  CATARACT EXTRACTION PHACO AND INTRAOCULAR LENS PLACEMENT (IOC);  Surgeon: Eulogio Bear, MD;  Location: [facility] ORS;  Service: Ophthalmology;  Laterality: Left;  [place] 1.05 AP% 15.5 CDE 10.16 Fluid Pack Lot # [id number]  . CATARACT EXTRACTION W/PHACO Right 08/01/2016   Procedure: CATARACT EXTRACTION PHACO AND INTRAOCULAR LENS PLACEMENT (IOC);  Surgeon: Eulogio Bear, MD;  Location: [facility] ORS;  Service: Ophthalmology;  Laterality: Right;  [place] 01:00.6 AP% 11.4 CDE 6.93  LOT # [id number]  . COLONOSCOPY    . CORONARY ANGIOPLASTY     SENTS 02/12/16  . CORONARY ARTERY BYPASS GRAFT  03/28/15    [facility]: Dr. Waldemar Dickens: LIMA to LAD, SVG to RCA, SVG to OM3, EVH  . DIALYSIS/PERMA CATHETER INSERTION N/A 02/11/2018   Procedure: DIALYSIS/PERMA CATHETER INSERTION;  Surgeon: Algernon Huxley, MD;  Location: [place] CV LAB;  Service: Cardiovascular;  Laterality: N/A;  . DIALYSIS/PERMA CATHETER REMOVAL Right 02/04/2018   Procedure: DIALYSIS/PERMA CATHETER REMOVAL;  Surgeon: Katha Cabal, MD;  Location: [place] CV LAB;  Service: Cardiovascular;  Laterality: Right;  . ESOPHAGOGASTRODUODENOSCOPY (EGD) WITH PROPOFOL N/A 10/24/2016   Procedure: ESOPHAGOGASTRODUODENOSCOPY (EGD) WITH PROPOFOL;  Surgeon: [name], MD;  Location: [facility];  Service: Endoscopy;  Laterality: N/A;  . EYE SURGERY Bilateral    cataract surgery  . EYE SURGERY     drains for glaucoma  . INSERTION EXPRESS TUBE SHUNT Right 08/01/2016   Procedure: INSERTION EXPRESS TUBE SHUNT;  Surgeon: Eulogio Bear, MD;  Location: [facility] ORS;  Service: Ophthalmology;  Laterality: Right;  . INSERTION OF AHMED VALVE Left 08/15/2016   Procedure: INSERTION OF AHMED VALVE;  Surgeon: Eulogio Bear, MD;  Location: [facility] ORS;  Service: Ophthalmology;  Laterality: Left;  . INSERTION OF DIALYSIS CATHETER    . LOWER EXTREMITY ANGIOGRAPHY Right 02/19/2018   Procedure: Lower Extremity Angiography;  Surgeon: Algernon Huxley, MD;  Location: [place]  CV LAB;  Service: Cardiovascular;  Laterality: Right;  . LOWER EXTREMITY ANGIOGRAPHY Left 01/30/2018   Procedure: Lower Extremity Angiography;  Surgeon: Katha Cabal, MD;  Location: [place] CV LAB;  Service: Cardiovascular;  Laterality: Left;  . REMOVAL OF A DIALYSIS CATHETER N/A 02/25/2018   Procedure: REMOVAL OF A DIALYSIS CATHETER;  Surgeon: Katha Cabal, MD;  Location: [facility] ORS;  Service: Vascular;  Laterality: N/A;  . TEMPORARY DIALYSIS CATHETER N/A 02/06/2018   Procedure: TEMPORARY DIALYSIS CATHETER;  Surgeon: Katha Cabal, MD;  Location: [place] CV LAB;  Service: Cardiovascular;  Laterality: N/A;  . TRANSTHORACIC ECHOCARDIOGRAM  January 2017    EF 60-65%. GR 2 DD. Mild degenerative mitral valve disease but no prolapse or regurgitation. Mild left atrial dilation. Mild to moderate LVH. Pericardial effusion gone  . UPPER EXTREMITY ANGIOGRAPHY Left 06/27/2016   Procedure: Upper Extremity Angiography;  Surgeon: Algernon Huxley, MD;  Location: [place] CV LAB;  Service: Cardiovascular;  Laterality: Left;  . UPPER EXTREMITY ANGIOGRAPHY Left  08/22/2016   Procedure: Upper Extremity Angiography;  Surgeon: Algernon Huxley, MD;  Location: [place] CV LAB;  Service: Cardiovascular;  Laterality: Left;  Marland Kitchen VASCULAR SURGERY      SOCIAL HISTORY:   Social History   Tobacco Use  . Smoking status: Never Smoker  . Smokeless tobacco: Never Used  Substance Use Topics  . Alcohol use: No    FAMILY HISTORY:   Family History  Problem Relation Age of Onset  . Diabetes Mellitus II Mother   . Heart failure Mother   . Pancreatic cancer Father     DRUG ALLERGIES:   Allergies  Allergen Reactions  . Chlorthalidone Anaphylaxis, Itching and Rash  . Fentanyl Rash  . Midazolam Rash  . Ace Inhibitors Other (See Comments)    Reaction:  Hyperkalemia, agitation   . Angiotensin Receptor Blockers Other (See Comments)    Reaction:  Hyperkalemia, agitation   . Norvasc [Amlodipine]  Itching and Rash  . Phenergan [Promethazine Hcl] Anxiety    "antsy, can't sit still"    REVIEW OF SYSTEMS:  Review of Systems  Constitutional: Negative for chills, fever and weight loss.  HENT: Negative for ear discharge, ear pain and nosebleeds.   Eyes: Negative for blurred vision, pain and discharge.  Respiratory: Negative for sputum production, shortness of breath, wheezing and stridor.   Cardiovascular: Negative for chest pain, palpitations, orthopnea and PND.  Gastrointestinal: Negative for abdominal pain, diarrhea, nausea and vomiting.  Genitourinary: Negative for frequency and urgency.  Musculoskeletal: Negative for back pain and joint pain.  Neurological: Positive for weakness. Negative for sensory change, speech change and focal weakness.  Psychiatric/Behavioral: Negative for depression and hallucinations. The patient is not nervous/anxious.      MEDICATIONS AT HOME:   Prior to Admission medications   Medication Sig Start Date End Date Taking? Authorizing Provider  acetaminophen (TYLENOL) 325 MG tablet Take 2 tablets (650 mg total) by mouth every 6 (six) hours as needed for mild pain (or Fever >/= 101). 03/23/18  Yes Gouru, Illene Silver, MD  apixaban (ELIQUIS) 2.5 MG TABS tablet Take 1 tablet (2.5 mg total) by mouth 2 (two) times daily. 02/15/18  Yes Vaughan Basta, MD  aspirin EC 81 MG tablet Take 1 tablet (81 mg total) by mouth daily. 10/19/16  Yes Dustin Flock, MD  atorvastatin (LIPITOR) 80 MG tablet Take 80 mg by mouth at bedtime.  05/05/17  Yes [provider]  brimonidine (ALPHAGAN) 0.2 % ophthalmic solution Place 1 drop into both eyes 3 (three) times daily. 03/18/18  Yes Mody, Ulice Bold, MD  calcitRIOL (ROCALTROL) 0.5 MCG capsule Take 0.5 mcg by mouth daily.   Yes [provider]  carvedilol (COREG) 6.25 MG tablet Take 1 tablet (6.25 mg total) by mouth 2 (two) times daily. 03/18/18  Yes Mody, Ulice Bold, MD  docusate sodium (COLACE) 100 MG capsule Take 1  capsule (100 mg total) by mouth 2 (two) times daily. 02/15/18  Yes Vaughan Basta, MD  doxycycline (VIBRAMYCIN) 100 MG capsule Take 1 capsule (100 mg total) by mouth 2 (two) times daily. 03/30/18  Yes Kris Hartmann, NP  epoetin alfa (EPOGEN,PROCRIT) 83419 UNIT/ML injection Inject 1 mL (10,000 Units total) into the vein Every Tuesday,Thursday,and Saturday with dialysis. 03/24/18  Yes Gouru, Illene Silver, MD  famotidine (PEPCID) 20 MG tablet Take 20 mg by mouth 2 (two) times daily.   Yes [provider]  folic acid (FOLVITE) 1 MG tablet Take 1 tablet (1 mg total) by mouth daily. 02/15/18 02/15/19 Yes Vaughan Basta,  MD  isosorbide mononitrate (IMDUR) 30 MG 24 hr tablet Take 30 mg by mouth daily.    Yes [provider]  levETIRAcetam (KEPPRA) 250 MG tablet Take 1 tablet (250 mg total) by mouth 3 (three) times a week. 03/25/18  Yes Gouru, Illene Silver, MD  levETIRAcetam (KEPPRA) 500 MG tablet Take 1 tablet (500 mg total) by mouth 2 (two) times daily. 03/23/18  Yes Gouru, Illene Silver, MD  midodrine (PROAMATINE) 10 MG tablet Take 1 tablet (10 mg total) by mouth 3 (three) times a week. Patient taking differently: Take 10 mg by mouth 3 (three) times a week. Take Tuesday, Thursday, and Saturday 02/17/18  Yes Vaughan Basta, MD  Multiple Vitamin (MULTIVITAMIN WITH MINERALS) TABS tablet Take 2 tablets by mouth daily. Gummy vitamins   Yes [provider]  Multiple Vitamins-Minerals (PRORENAL + D) TABS Take 1 tablet by mouth daily.   Yes [provider]  Omega-3 1000 MG CAPS Take 2 capsules by mouth daily.    Yes [provider]  Polyethyl Glycol-Propyl Glycol 0.4-0.3 % SOLN Place 1 drop into both eyes 2 (two) times daily.   Yes [provider]  sevelamer carbonate (RENVELA) 800 MG tablet Take 800 mg by mouth 3 (three) times daily with meals.   Yes [provider]  timolol (TIMOPTIC) 0.5 % ophthalmic solution Place 1 drop into both eyes 3 (three)  times daily. 03/18/18  Yes Mody, Ulice Bold, MD  traMADol (ULTRAM) 50 MG tablet Take 1 tablet (50 mg total) by mouth every 8 (eight) hours as needed for moderate pain or severe pain. 03/23/18  Yes Gouru, Illene Silver, MD  vitamin B-12 (CYANOCOBALAMIN) 1000 MCG tablet Take 1 tablet (1,000 mcg total) by mouth daily. 02/15/18  Yes Vaughan Basta, MD  mirtazapine (REMERON) 7.5 MG tablet Take by mouth. 12/18/17   [provider]  nitroGLYCERIN (NITROSTAT) 0.4 MG SL tablet Place 1 tablet (0.4 mg total) under the tongue every 5 (five) minutes as needed for chest pain. 09/05/16   Loletha Grayer, MD  ondansetron (ZOFRAN) 4 MG tablet Take 4 mg by mouth every 8 (eight) hours as needed for nausea or vomiting.    [provider]  rOPINIRole (REQUIP) 0.25 MG tablet Take by mouth. 12/02/16   [provider]  valsartan (DIOVAN) 80 MG tablet Take by mouth. 12/03/17 12/03/18  [provider]      VITAL SIGNS:  Blood pressure (!) 206/77, pulse (!) 59, temperature 97.9 F (36.6 C), temperature source Oral, height 5' (1.524 m), weight 54.4 kg, SpO2 95 %.  PHYSICAL EXAMINATION:  GENERAL:  67 y.o.-year-old patient lying in the bed with no acute distress.  EYES: Pupils equal, round, reactive to light and accommodation. No scleral icterus. Extraocular muscles intact.  HEENT: Head atraumatic, normocephalic. Oropharynx and nasopharynx clear.  NECK:  Supple, no jugular venous distention. No thyroid enlargement, no tenderness.  LUNGS: Normal breath sounds bilaterally, no wheezing, rales,rhonchi or crepitation. No use of accessory muscles of respiration.  CARDIOVASCULAR: S1, S2 normal. No murmurs, rubs, or gallops.  ABDOMEN: Soft, nontender, nondistended. Bowel sounds present. No organomegaly or mass.  EXTREMITIES: RIGHT BKA: dehiscence on lateral margin, soft, no bleeding, hematoma. LEFT BKA: necrosis, fibrinous debris, serous drainage.  NEUROLOGIC: Cranial nerves II through XII are intact.  extremities. Sensation intact. PSYCHIATRIC: The patient is alert and oriented x 3.  SKIN: No obvious rash, lesion, or ulcer.   LABORATORY PANEL:   CBC Recent Labs  Lab 04/13/18 0903  WBC 8.5  HGB 10.0*  HCT  33.7*  PLT 266   ------------------------------------------------------------------------------------------------------------------  Chemistries  Recent Labs  Lab 04/13/18 0903  NA 137  K 4.6  CL 100  CO2 27  GLUCOSE 167*  BUN 39*  CREATININE 5.48*  CALCIUM 10.1  AST 18  ALT 15  ALKPHOS 147*  BILITOT 0.4   ------------------------------------------------------------------------------------------------------------------  Cardiac Enzymes No results for input(s): TROPONINI in the last 168 hours. ------------------------------------------------------------------------------------------------------------------  RADIOLOGY:  No results found.  EKG:    IMPRESSION AND PLAN:   Stephanie Ellis  is a 67 y.o. female with a known history of end-stage renal disease on hemodialysis, chronic anemia, bilateral below knee amputation due to severe peripheral vascular disease, carotid artery disease, hypertension comes to the emergency room from the [facility] of elements after patient fell while trying to slide on the board from her bed to the wheelchair. She started noticing some bleeding from the right amputation stump.  1. Bilateral below knee amputation secondary to severe peripheral arterial disease -patient sustained a fall this morning. She has some wound dehiscence and will need bilateral BKA washout/revision/wound VAC per vascular -PRN pain meds  2. Anemia of chronic disease -hemoglobin 10.0 -transfuse as needed  3. Hypertension continue home meds   4. Type II diabetes sliding scale insulin  5. End-stage renal disease nephrology consultation placed with Dr. Zollie Scale. Patient will be resume back on her hemodialysis  6. DVT prophylaxis subcu heparin  Above was discussed  with patient and daughter in the ER discussed with vascular surgery Dr. Lorenso Courier  All the records are reviewed and case discussed with ED provider. Management plans discussed with the patient, family and they are in agreement.  CODE STATUS: DNR  TOTAL TIME TAKING CARE OF THIS PATIENT: 50 minutes.    Fritzi Mandes M.D on 04/13/2018 at 9:38 AM  Between 7am to 6pm - Pager - [phone]  After 6pm go to [email or web address] - password [email or web address] [facility]  [facility]  Office  [phone]  CC: Primary care physician; Hanley Ben, NP

## 2018-04-13 NOTE — ED Notes (Signed)
Gave report to pat in same day surgery.

## 2018-04-13 NOTE — Progress Notes (Signed)
Family Meeting Note  Advance Directive: yes Today a meeting took place with the ER with patient and daughter patient comes in after a fall at the [place]. She has bilateral wound dehiscence of the BK stent. She is good to be going for surgery. Patient has multiple comorbidities. Code status discussed with patient and daughter. She carries a DNR out of facility form. Confirmed with daughter patient is a DNR. Time spent during discussion: 16 mins  Fritzi Mandes, MD

## 2018-04-13 NOTE — ED Triage Notes (Signed)
Pt arrives via ems from the [facility]. Pt was moving from bed to wheelchair this morning when she fell off sliding board onto buttock. Pt denies hitting head, or LOC. A&O x 4 on arrival. Pt recently had bilateral below the leg amputation, closure on the right leg surgical site has opened with fall with bleeding controled at this time. Opening around 4+" in length.

## 2018-04-13 NOTE — Anesthesia Preprocedure Evaluation (Signed)
Anesthesia Evaluation  Patient identified by MRN, date of birth, ID band Patient awake    Reviewed: Allergy & Precautions, H&P , NPO status , Patient's Chart, lab work & pertinent test results, reviewed documented beta blocker date and time   History of Anesthesia Complications (+) history of anesthetic complications  Airway Mallampati: II  TM Distance: >3 FB Neck ROM: full    Dental  (+) Teeth Intact   Pulmonary neg pulmonary ROS,    Pulmonary exam normal        Cardiovascular Exercise Tolerance: Poor hypertension, + angina with exertion + CAD, + Past MI, + Peripheral Vascular Disease and +CHF  negative cardio ROS Normal cardiovascular exam+ Valvular Problems/Murmurs  Rate:Normal     Neuro/Psych  Neuromuscular disease negative neurological ROS  negative psych ROS   GI/Hepatic negative GI ROS, Neg liver ROS, GERD  Medicated,  Endo/Other  negative endocrine ROSdiabetes  Renal/GU Renal diseasenegative Renal ROS  negative genitourinary   Musculoskeletal   Abdominal   Peds  Hematology negative hematology ROS (+) Blood dyscrasia, anemia ,   Anesthesia Other Findings   Reproductive/Obstetrics negative OB ROS                             Anesthesia Physical Anesthesia Plan  ASA: IV and emergent  Anesthesia Plan: General LMA   Post-op Pain Management:    Induction:   PONV Risk Score and Plan:   Airway Management Planned:   Additional Equipment:   Intra-op Plan:   Post-operative Plan:   Informed Consent: I have reviewed the patients History and Physical, chart, labs and discussed the procedure including the risks, benefits and alternatives for the proposed anesthesia with the patient or authorized representative who has indicated his/her understanding and acceptance.     Plan Discussed with: CRNA  Anesthesia Plan Comments:         Anesthesia Quick Evaluation

## 2018-04-13 NOTE — ED Notes (Signed)
Olean Ree RN from OR states they will be coming to get the patient and managing BP in OR

## 2018-04-13 NOTE — ED Notes (Signed)
Spoke to [employer] for OR. Olean Ree to call back regarding when they will be getting pt for surgery

## 2018-04-13 NOTE — Consult Note (Signed)
[facility] Nurse wound consult note Patient receiving care in [facility].  I spoke with her primary RN, Barnett Applebaum via telephone.  She reported the VAC to the left stump is on and functioning without any leak alarms.  The right stump has a surgical dressing in place.  I explained that the [facility] team is off on Wednesday due to the holiday, but that I would stop in to see the patient tomorrow just to check on the pump and verify it continues to function properly.  Barnett Applebaum explained that primary RN staff can change the [facility] dressing on Wednesday. Reason for Consult: VAC therapy to LLE. Wound type: Surgical Val Riles, RN, MSN, [facility], CNS-BC, pager [phone]

## 2018-04-13 NOTE — ED Notes (Addendum)
[facility] here to transport pt to OR at this time. Given surgical consent form and pt labels

## 2018-04-14 ENCOUNTER — Encounter: Payer: Self-pay | Admitting: Vascular Surgery

## 2018-04-14 LAB — PHOSPHORUS: Phosphorus: 1.8 mg/dL — ABNORMAL LOW (ref 2.5–4.6)

## 2018-04-14 LAB — GLUCOSE, CAPILLARY: Glucose-Capillary: 114 mg/dL — ABNORMAL HIGH (ref 70–99)

## 2018-04-14 MED ORDER — SODIUM CHLORIDE 0.9 % IV SOLN: 100.0000 mL | INTRAVENOUS | Status: DC | PRN

## 2018-04-14 MED ORDER — LEVETIRACETAM 250 MG PO TABS
250.0000 mg | ORAL_TABLET | ORAL | Status: DC
  Administered 2018-04-14 – 2018-04-16 (×2): 250 mg via ORAL
  Filled 2018-04-14 (×2): qty 1

## 2018-04-14 MED ORDER — LIDOCAINE HCL (PF) 1 % IJ SOLN
5.0000 mL | INTRAMUSCULAR | Status: DC | PRN
  Filled 2018-04-14: qty 5

## 2018-04-14 MED ORDER — LIDOCAINE-PRILOCAINE 2.5-2.5 % EX CREA
1.0000 "application " | TOPICAL_CREAM | CUTANEOUS | Status: DC | PRN
  Filled 2018-04-14: qty 5

## 2018-04-14 MED ORDER — EPOETIN ALFA 10000 UNIT/ML IJ SOLN
4000.0000 [IU] | INTRAMUSCULAR | Status: DC
  Administered 2018-04-14 – 2018-04-16 (×2): 4000 [IU] via INTRAVENOUS

## 2018-04-14 MED ORDER — ISOSORBIDE MONONITRATE ER 30 MG PO TB24
60.0000 mg | ORAL_TABLET | Freq: Every day | ORAL | Status: DC
  Administered 2018-04-14 – 2018-04-16 (×3): 60 mg via ORAL
  Filled 2018-04-14 (×5): qty 2

## 2018-04-14 MED ORDER — HYDRALAZINE HCL 25 MG PO TABS
25.0000 mg | ORAL_TABLET | Freq: Three times a day (TID) | ORAL | Status: DC
  Administered 2018-04-14 – 2018-04-16 (×5): 25 mg via ORAL
  Filled 2018-04-14 (×6): qty 1

## 2018-04-14 MED ORDER — CHLORHEXIDINE GLUCONATE CLOTH 2 % EX PADS
6.0000 | MEDICATED_PAD | Freq: Every day | CUTANEOUS | Status: DC
  Administered 2018-04-14 – 2018-04-16 (×3): 6 via TOPICAL

## 2018-04-14 MED ORDER — HEPARIN SODIUM (PORCINE) 1000 UNIT/ML DIALYSIS
1000.0000 [IU] | INTRAMUSCULAR | Status: DC | PRN
  Filled 2018-04-14: qty 1

## 2018-04-14 MED ORDER — PENTAFLUOROPROP-TETRAFLUOROETH EX AERO
1.0000 "application " | INHALATION_SPRAY | CUTANEOUS | Status: DC | PRN
  Filled 2018-04-14: qty 30

## 2018-04-14 MED ORDER — ALTEPLASE 2 MG IJ SOLR: 2.0000 mg | Freq: Once | INTRAMUSCULAR | Status: DC | PRN

## 2018-04-14 NOTE — Progress Notes (Signed)
1 Day Post-Op   Subjective/Chief Complaint: Lots of nausea; emesis this morning, not feeling well. Minimal pain in stump Left >Right.   Objective: Vital signs in last 24 hours: Temp:  [97.4 F (36.3 C)-98.6 F (37 C)] 98.2 F (36.8 C) (12/24 0443) Pulse Rate:  [54-66] 60 (12/24 0443) Resp:  [0-22] 20 (12/24 0443) BP: (141-208)/(50-90) 188/72 (12/24 0443) SpO2:  [92 %-100 %] 95 % (12/24 0443) Last BM Date: 04/12/18  Intake/Output from previous day: 12/23 0701 - 12/24 0700 In: 640 [P.O.:240; I.V.:400] Out: -  Intake/Output this shift: Total I/O In: 120 [P.O.:120] Out: -   General appearance: alert and mild distress Cardio: regular rate and rhythm GI: soft, non-tender; bowel sounds normal; no masses,  no organomegaly Extremities: Bilateral BKA stump dressings in place. LEFT VAC- good seal, scant output.  Lab Results:  Recent Labs    04/13/18 0903  WBC 8.5  HGB 10.0*  HCT 33.7*  PLT 266   BMET Recent Labs    04/13/18 0903  NA 137  K 4.6  CL 100  CO2 27  GLUCOSE 167*  BUN 39*  CREATININE 5.48*  CALCIUM 10.1   PT/INR No results for input(s): LABPROT, INR in the last 72 hours. ABG No results for input(s): PHART, HCO3 in the last 72 hours.  Invalid input(s): PCO2, PO2  Studies/Results: No results found.  Anti-infectives: Anti-infectives (From admission, onward)   Start     Dose/Rate Route Frequency Ordered Stop   04/13/18 1349  bacitracin 50,000 Units in sodium chloride 0.9 % 500 mL irrigation  Status:  Discontinued       As needed 04/13/18 1350 04/13/18 1350   04/13/18 1145  ceFAZolin (ANCEF) IVPB 2g/100 mL premix     2 g 200 mL/hr over 30 Minutes Intravenous  Once 04/13/18 1141 04/13/18 1210   04/13/18 1131  ceFAZolin (ANCEF) 2-4 GM/100ML-% IVPB    Note to Pharmacy:  Norton Blizzard  : cabinet override      04/13/18 1131 04/13/18 1210      Assessment/Plan: s/p Procedure(s): REVISION OF BILATERAL AMPUTATIONS BELOW KNEE WITH WOUND VAC  APPLICATIONS (Bilateral) APPLICATION OF WOUND VAC (Left) Hemodialysis today  BP control Will change VAC tomorrow.  LOS: 1 day    Jamesetta So A 04/14/2018

## 2018-04-14 NOTE — Consult Note (Signed)
[place] Nurse wound consult note VAC placed yesterday in the ED functioning without any alarms. Val Riles, RN, MSN, CWOCN, CNS-BC, pager [phone]

## 2018-04-14 NOTE — Clinical Social Work Note (Signed)
Clinical Social Work Assessment  Patient Details  Name: Stephanie Ellis MRN: [id number] Date of Birth: 1950-12-14  Date of referral:  04/14/18               Reason for consult:  Discharge Planning                Permission sought to share information with:    Permission granted to share information::     Name::        Agency::     Relationship::     Contact Information:     Housing/Transportation Living arrangements for the past 2 months:  [place], [place] of Information:  Adult Children Patient Interpreter Needed:  None Criminal Activity/Legal Involvement Pertinent to Current Situation/Hospitalization:  No - Comment as needed Significant Relationships:  Adult Children Lives with:  Facility Resident Do you feel safe going back to the place where you live?    Need for family participation in patient care:  Yes (Comment)  Care giving concerns:  Patient was at [facility] for rehab and then transitioned to [facility].   Social Worker assessment / plan:  Patient was off unit getting her hemodialysis. Patient is known to the CSW as CSW was involved in placing patient for rehab and has followed her during her last several admissions. CSW contacted patient's daughter Stephanie Ellis via phone this afternoon to discuss discharge planning. CSW made [name] aware that patient would require her wound vac at discharge and that [facility] would not be able to accept her back while on the wound vac. She is agreeable to looking into placement for wound vac management. Bed search initiated.   Employment status:    Insurance information:  Medicare PT Recommendations:    Information / Referral to community resources:     Patient/Family's Response to care:  Patient's daughter expressed appreciation for CSW assistance.  Patient/Family's Understanding of and Emotional Response to Diagnosis, Current Treatment, and Prognosis:  Patient's daughter was hopeful that  patient could return to [facility] but is understanding that her mother's needs require a higher level of care at this time.  Emotional Assessment Appearance:  Appears stated age Attitude/Demeanor/Rapport:    Affect (typically observed):    Orientation:  Oriented to Self, Oriented to Place, Oriented to  Time Alcohol / Substance use:  Not Applicable Psych involvement (Current and /or in the community):  No (Comment)  Discharge Needs  Concerns to be addressed:  Care Coordination Readmission within the last 30 days:  No Current discharge risk:  None Barriers to Discharge:  No Barriers Identified   Shela Leff, LCSW 04/14/2018, 7:02 PM

## 2018-04-14 NOTE — Progress Notes (Signed)
Stephanie Ellis at [place] NAME: Stephanie Ellis    MR#:  [id number]  DATE OF BIRTH:  05-Apr-1951  SUBJECTIVE:  postop day one. Daughter in the room. Poor appetite. Procedure went well.  REVIEW OF SYSTEMS:   Review of Systems  Constitutional: Negative for chills, fever and weight loss.  HENT: Negative for ear discharge, ear pain and nosebleeds.   Eyes: Negative for blurred vision, pain and discharge.  Respiratory: Negative for sputum production, shortness of breath, wheezing and stridor.   Cardiovascular: Negative for chest pain, palpitations, orthopnea and PND.  Gastrointestinal: Negative for abdominal pain, diarrhea, nausea and vomiting.  Genitourinary: Negative for frequency and urgency.  Musculoskeletal: Negative for back pain and joint pain.  Neurological: Negative for sensory change, speech change, focal weakness and weakness.  Psychiatric/Behavioral: Negative for depression and hallucinations. The patient is not nervous/anxious.    Tolerating Diet:some Tolerating PT: BKA +  DRUG ALLERGIES:   Allergies  Allergen Reactions  . Chlorthalidone Anaphylaxis, Itching and Rash  . Fentanyl Rash  . Midazolam Rash  . Ace Inhibitors Other (See Comments)    Reaction:  Hyperkalemia, agitation   . Angiotensin Receptor Blockers Other (See Comments)    Reaction:  Hyperkalemia, agitation   . Norvasc [Amlodipine] Itching and Rash  . Phenergan [Promethazine Hcl] Anxiety    "antsy, can't sit still"    VITALS:  Blood pressure (!) 178/156, pulse (!) 57, temperature 98 F (36.7 C), temperature source Oral, resp. rate 18, height 5' (1.524 m), weight 54.4 kg, SpO2 95 %.  PHYSICAL EXAMINATION:   Physical Exam  GENERAL:  67 y.o.-year-old patient lying in the bed with no acute distress.  EYES: Pupils equal, round, reactive to light and accommodation. No scleral icterus. Extraocular muscles intact.  HEENT: Head atraumatic, normocephalic.  Oropharynx and nasopharynx clear.  NECK:  Supple, no jugular venous distention. No thyroid enlargement, no tenderness.  LUNGS: Normal breath sounds bilaterally, no wheezing, rales, rhonchi. No use of accessory muscles of respiration.  CARDIOVASCULAR: S1, S2 normal. No murmurs, rubs, or gallops.  ABDOMEN: Soft, nontender, nondistended. Bowel sounds present. No organomegaly or mass.  EXTREMITIES: bilateral knee amputation. Stump wound VAC and surgical dressing present  NEUROLOGIC: Cranial nerves II through XII are intact. No focal Motor or sensory deficits b/l.   PSYCHIATRIC:  patient is alert and oriented x 3.  SKIN: No obvious rash, lesion, or ulcer.   LABORATORY PANEL:  CBC Recent Labs  Lab 04/13/18 0903  WBC 8.5  HGB 10.0*  HCT 33.7*  PLT 266    Chemistries  Recent Labs  Lab 04/13/18 0903  NA 137  K 4.6  CL 100  CO2 27  GLUCOSE 167*  BUN 39*  CREATININE 5.48*  CALCIUM 10.1  AST 18  ALT 15  ALKPHOS 147*  BILITOT 0.4   Cardiac Enzymes No results for input(s): TROPONINI in the last 168 hours. RADIOLOGY:  No results found. ASSESSMENT AND PLAN:  [name]  is a 67 y.o. female with a known history of end-stage renal disease on hemodialysis, chronic anemia, bilateral below knee amputation due to severe peripheral vascular disease, carotid artery disease, hypertension comes to the emergency room from the [facility] of elements after patient fell while trying to slide on the board from her bed to the wheelchair. She started noticing some bleeding from the right amputation stump.  1. Bilateral below knee amputation secondary to severe peripheral arterial disease -patient sustained a fall this morning.  -s/p  bilateral BKA washout/revision/wound VAC per vascular dr Lorenso Courier POD#1 -PRN pain meds  2. Anemia of chronic disease -hemoglobin 10.0 -transfuse as needed  3. Hypertension continue home meds   4. Type II diabetes sliding scale insulin  5. End-stage renal disease  nephrology consultation placed with Dr. Zollie Scale. Patient will be resume back on her hemodialysis  CODE STATUS: dnr  DVT Prophylaxis: eliquis TOTAL TIME TAKING CARE OF THIS PATIENT: *25* minutes.  >50% time spent on counselling and coordination of care  POSSIBLE D/C IN *2-3 DAYS, DEPENDING ON CLINICAL CONDITION.  Note: This dictation was prepared with Dragon dictation along with smaller phrase technology. Any transcriptional errors that result from this process are unintentional.  Fritzi Mandes M.D on 04/14/2018 at 3:25 PM  Between 7am to 6pm - Pager - [phone]  After 6pm go to [email or web address] - password [email or web address] [facility]  Office  [phone]  CC: Primary care physician; Hanley Ben, NPPatient ID: Stephanie Ellis, female   DOB: [birth year], 67 y.o.   MRN: [id number]

## 2018-04-14 NOTE — Progress Notes (Signed)
Hd started

## 2018-04-14 NOTE — Progress Notes (Signed)
[facility]  ROUNDING NOTE   Subjective:  Patient admitted with infected lower extremity amputation stumps. She underwent wound washout and debridement yesterday. Due for HD today.   Objective:  Vital signs in last 24 hours:  Temp:  [97.4 F (36.3 C)-98.6 F (37 C)] 98.2 F (36.8 C) (12/24 0443) Pulse Rate:  [54-65] 60 (12/24 0443) Resp:  [0-22] 20 (12/24 0443) BP: (141-208)/(50-90) 188/72 (12/24 0443) SpO2:  [92 %-100 %] 95 % (12/24 0443)  Weight change:  Filed Weights   04/13/18 0809  Weight: 54.4 kg    Intake/Output: I/O last 3 completed shifts: In: 640 [P.O.:240; I.V.:400] Out: -    Intake/Output this shift:  Total I/O In: 120 [P.O.:120] Out: -   Physical Exam: General: NAD, laying in the bed  Head: Normocephalic, atraumatic. Moist oral mucosal membranes  Eyes: Anicteric,   Neck: Supple,  Lungs:  Normal breathing effort, Naomi O2  Heart: Regular rate and rhythm  Abdomen:  Soft, nontender, BS present  Extremities:  bilateral BKA, wound vac in place  Neurologic:  Alert, able to answer questions appropriately  Access:  Left IJ permcath    Basic Metabolic Panel: Recent Labs  Lab 04/13/18 0903  NA 137  K 4.6  CL 100  CO2 27  GLUCOSE 167*  BUN 39*  CREATININE 5.48*  CALCIUM 10.1    Liver Function Tests: Recent Labs  Lab 04/13/18 0903  AST 18  ALT 15  ALKPHOS 147*  BILITOT 0.4  PROT 7.9  ALBUMIN 3.2*   No results for input(s): LIPASE, AMYLASE in the last 168 hours. No results for input(s): AMMONIA in the last 168 hours.  CBC: Recent Labs  Lab 04/13/18 0903  WBC 8.5  NEUTROABS 5.9  HGB 10.0*  HCT 33.7*  MCV 100.0  PLT 266    Cardiac Enzymes: No results for input(s): CKTOTAL, CKMB, CKMBINDEX, TROPONINI in the last 168 hours.  BNP: Invalid input(s): POCBNP  CBG: Recent Labs  Lab 04/13/18 1115 04/13/18 1340  GLUCAP 145* 142*    Microbiology: Results for orders placed or performed during the hospital encounter  of 04/13/18  Aerobic/Anaerobic Culture (surgical/deep wound)     Status: None (Preliminary result)   Collection Time: 04/13/18 12:10 PM  Result Value Ref Range Status   Specimen Description   Final    WOUND left below knee amputation Performed at [facility], [address], [place], [place] [address]    Special Requests   Final    NONE Performed at [facility], [address] [place], [place] [address]    Gram Stain   Final    MODERATE WBC PRESENT, PREDOMINANTLY PMN MODERATE GRAM POSITIVE COCCI    Culture   Final    CULTURE REINCUBATED FOR BETTER GROWTH Performed at [facility], [place] [address] [place], [place] [address]    Report Status PENDING  Incomplete    Coagulation Studies: No results for input(s): LABPROT, INR in the last 72 hours.  Urinalysis: No results for input(s): COLORURINE, LABSPEC, PHURINE, GLUCOSEU, HGBUR, BILIRUBINUR, KETONESUR, PROTEINUR, UROBILINOGEN, NITRITE, LEUKOCYTESUR in the last 72 hours.  Invalid input(s): APPERANCEUR    Imaging: No results found.   Medications:   . sodium chloride    . sodium chloride     . apixaban  2.5 mg Oral BID  . atorvastatin  80 mg Oral QHS  . brimonidine  1 drop Both Eyes TID  . calcitRIOL  0.5 mcg Oral Daily  . carvedilol  6.25 mg Oral BID  . Chlorhexidine Gluconate Cloth  6 each Topical Q0600  . docusate sodium  100 mg Oral BID  . famotidine  20 mg Oral BID  . folic acid  1 mg Oral Daily  . hydrALAZINE  25 mg Oral Q8H  . isosorbide mononitrate  60 mg Oral Daily  . [START ON 04/15/2018] levETIRAcetam  250 mg Oral Once per day on Mon Wed Fri  . levETIRAcetam  500 mg Oral BID  . midodrine  10 mg Oral Once per day on Tue Thu Sat  . multivitamin with minerals  1 tablet Oral Daily  . omega-3 acid ethyl esters  2 capsule Oral Daily  . rOPINIRole  0.25 mg Oral QHS  . sevelamer carbonate  800 mg Oral TID WC  . timolol  1 drop Both Eyes TID  . vitamin B-12  1,000 mcg Oral Daily    sodium chloride, sodium chloride, acetaminophen **OR** acetaminophen, acetaminophen, alteplase, heparin, lidocaine (PF), lidocaine-prilocaine, nitroGLYCERIN, ondansetron **OR** ondansetron (ZOFRAN) IV, pentafluoroprop-tetrafluoroeth, polyethylene glycol, traMADol  Assessment/ Plan:  Ms. Jemma Rasp is a 67 y.o. 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  [facility] [place] TTS hemodialysis IJ permcath  1. End Stage Renal Disease  Patient due for dialysis today.  Orders have been prepared.  2. 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 Lucky Cowboy, R+L LE BKA washout 04/13/18. -Patient status post BKA washout bilateral.  Antibiotic management as per hospitalist.  3. Anemia of chronic kidney disease: Hemoglobin currently 10.0.  Start the patient on Epogen 4000 units IV with dialysis.  4. Secondary Hyperparathyroidism: Sevelamer with meals   LOS: 1 Masae Lukacs 12/24/201911:13 AM

## 2018-04-14 NOTE — Care Management (Signed)
Elvera Bicker dialysis liaison notified of admission.

## 2018-04-14 NOTE — NC FL2 (Signed)
[place] LEVEL OF CARE SCREENING TOOL     IDENTIFICATION  Patient Name: Stephanie Ellis Birthdate: 02/21/1951 Sex: female Admission Date (Current Location): 04/13/2018  [place] and [place] Number:  Engineering geologist and Address:  [facility], [address] [place], [place] [address]      Provider Number: [id number]  Attending Physician Name and Address:  Fritzi Mandes, MD  Relative Name and Phone Number:       Current Level of Care: Hospital Recommended Level of Care: [place] Prior Approval Number:    Date Approved/Denied:   PASRR Number:    Discharge Plan: SNF    Current Diagnoses: Patient Active Problem List   Diagnosis Date Noted  . BKA stump complication ([place]) 08/67/6195  . Unilateral complete BKA, right, initial encounter ([place]) 03/30/2018  . Transient loss of consciousness 03/19/2018  . Transient alteration of awareness   . VT (ventricular tachycardia) ([place])   . Disorientation   . Hypertensive urgency 03/08/2018  . Ischemia of extremity 02/18/2018  . ESRD (end stage renal disease) ([place])   . Diabetic osteomyelitis ([place])   . Peripheral vascular disease ([place])   . Advanced care planning/counseling discussion   . Palliative care by specialist   . Goals of care, counseling/discussion   . Diabetes mellitus, type 2 ([place]) 01/30/2018  . Sepsis ([place]) 01/29/2018  . Peripheral neuropathy 06/17/2017  . Encephalopathy 02/06/2017  . Hypertensive emergency 02/06/2017  . Endophthalmitis, left eye 12/05/2016  . Vomiting 10/24/2016  . Hematuria 10/17/2016  . Acute lower UTI 10/17/2016  . Anesthesia complication 09/32/6712  . Chest pain with moderate risk for cardiac etiology 09/04/2016  . Steal syndrome dialysis vascular access, initial encounter ([place]) 06/18/2016  . 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 Coronary artery disease involving coronary bypass graft of native heart with angina  pectoris ([place]) 02/10/2016  . Hypertension 02/02/2016  . Hyperlipidemia 02/02/2016  . Coronary artery disease involving left main coronary artery 10/04/2015  . Abdominal pain of unknown etiology 10/04/2015  . ESRD (end stage renal disease) on dialysis ([place])   . Type II diabetes mellitus with complication ([place])   . NSTEMI (non-ST elevated myocardial infarction) ([place])   . Right sided abdominal pain   . Elevated troponin 10/03/2015  . 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 Chronic diastolic congestive heart failure ([place]) 04/19/2014  . Stage 5 chronic kidney disease on chronic dialysis ([place]) 04/19/2014  . Anemia 09/20/2013  . Routine health maintenance 01/29/2013    Orientation RESPIRATION BLADDER Height & Weight     Self, Place, Situation  Normal Incontinent Weight: 120 lb (54.4 kg) Height:  5' (152.4 cm)  BEHAVIORAL SYMPTOMS/MOOD NEUROLOGICAL BOWEL NUTRITION STATUS  (none) (none) Continent Diet(renal)  AMBULATORY STATUS COMMUNICATION OF NEEDS Skin   Total Care(bilateral amputee) Verbally Surgical wounds                       Personal Care Assistance Level of Assistance  Bathing, Feeding, Dressing Bathing Assistance: Limited assistance Feeding assistance: Limited assistance Dressing Assistance: Limited assistance     Functional Limitations Info  (none)          SPECIAL CARE FACTORS FREQUENCY  PT (By licensed PT)(wound vac management; outpatient hemodialysis)                    Contractures Contractures Info: Not present    Additional Factors Info  Code Status Code Status Info: dnr  Current Medications (04/14/2018):  This is the current hospital active medication list Current Facility-Administered Medications  Medication Dose Route Frequency Provider Last Rate Last Dose  . acetaminophen (TYLENOL) tablet 650 mg  650 mg Oral Q6H PRN Fritzi Mandes, MD       Or  . acetaminophen (TYLENOL) suppository 650 mg  650 mg Rectal Q6H PRN Fritzi Mandes, MD      .  acetaminophen (TYLENOL) tablet 650 mg  650 mg Oral Q6H PRN Esco, Miechia A, MD      . apixaban (ELIQUIS) tablet 2.5 mg  2.5 mg Oral BID Fritzi Mandes, MD   2.5 mg at 04/14/18 1132  . atorvastatin (LIPITOR) tablet 80 mg  80 mg Oral QHS Fritzi Mandes, MD   80 mg at 04/13/18 2320  . brimonidine (ALPHAGAN) 0.2 % ophthalmic solution 1 drop  1 drop Both Eyes TID Fritzi Mandes, MD   1 drop at 04/14/18 1137  . calcitRIOL (ROCALTROL) capsule 0.5 mcg  0.5 mcg Oral Daily Fritzi Mandes, MD   0.5 mcg at 04/14/18 1132  . carvedilol (COREG) tablet 6.25 mg  6.25 mg Oral BID Fritzi Mandes, MD   6.25 mg at 04/13/18 2321  . Chlorhexidine Gluconate Cloth 2 % PADS 6 each  6 each Topical Q0600 Holley Raring Munsoor, MD   6 each at 04/14/18 1142  . docusate sodium (COLACE) capsule 100 mg  100 mg Oral BID Fritzi Mandes, MD   100 mg at 04/14/18 1133  . epoetin alfa (EPOGEN,PROCRIT) injection 4,000 Units  4,000 Units Intravenous Q T,Th,Sa-HD Holley Raring, Munsoor, MD   4,000 Units at 04/14/18 1527  . famotidine (PEPCID) tablet 20 mg  20 mg Oral BID Fritzi Mandes, MD   20 mg at 04/14/18 1133  . folic acid (FOLVITE) tablet 1 mg  1 mg Oral Daily Fritzi Mandes, MD   1 mg at 04/14/18 1133  . hydrALAZINE (APRESOLINE) tablet 25 mg  25 mg Oral Q8H Fritzi Mandes, MD      . isosorbide mononitrate (IMDUR) 24 hr tablet 60 mg  60 mg Oral Daily Arta Silence, MD   60 mg at 04/14/18 1825  . levETIRAcetam (KEPPRA) tablet 250 mg  250 mg Oral Q T,Th,Sat-1800 Fritzi Mandes, MD   250 mg at 04/14/18 1824  . levETIRAcetam (KEPPRA) tablet 500 mg  500 mg Oral BID Fritzi Mandes, MD   500 mg at 04/14/18 1134  . midodrine (PROAMATINE) tablet 10 mg  10 mg Oral Once per day on Tue Thu Sat Fritzi Mandes, MD   Stopped at 04/14/18 0900  . multivitamin with minerals tablet 1 tablet  1 tablet Oral Daily Fritzi Mandes, MD   1 tablet at 04/13/18 1535  . nitroGLYCERIN (NITROSTAT) SL tablet 0.4 mg  0.4 mg Sublingual Q5 min PRN Fritzi Mandes, MD      . omega-3 acid ethyl esters (LOVAZA) capsule  2 g  2 capsule Oral Daily Fritzi Mandes, MD   2 g at 04/14/18 1134  . ondansetron (ZOFRAN) tablet 4 mg  4 mg Oral Q6H PRN Fritzi Mandes, MD       Or  . ondansetron [facility]) injection 4 mg  4 mg Intravenous Q6H PRN Fritzi Mandes, MD      . polyethylene glycol (MIRALAX / GLYCOLAX) packet 17 g  17 g Oral Daily PRN Fritzi Mandes, MD      . rOPINIRole (REQUIP) tablet 0.25 mg  0.25 mg Oral QHS Fritzi Mandes, MD   0.25 mg at 04/13/18 2322  . sevelamer carbonate (  RENVELA) tablet 800 mg  800 mg Oral TID WC Fritzi Mandes, MD      . timolol (TIMOPTIC) 0.5 % ophthalmic solution 1 drop  1 drop Both Eyes TID Fritzi Mandes, MD   1 drop at 04/14/18 1136  . traMADol (ULTRAM) tablet 50 mg  50 mg Oral Q8H PRN Fritzi Mandes, MD   50 mg at 04/14/18 1824  . vitamin B-12 (CYANOCOBALAMIN) tablet 1,000 mcg  1,000 mcg Oral Daily Fritzi Mandes, MD   1,000 mcg at 04/14/18 1135     Discharge Medications: Please see discharge summary for a list of discharge medications.  Relevant Imaging Results:  Relevant Lab Results:   Additional Information ss: [id number]  Shela Leff, LCSW

## 2018-04-14 NOTE — Progress Notes (Signed)
This note also relates to the following rows which could not be included: Pulse Rate - Cannot attach notes to unvalidated device data Resp - Cannot attach notes to unvalidated device data BP - Cannot attach notes to unvalidated device data  Hd completed

## 2018-04-14 NOTE — Evaluation (Signed)
Physical Therapy Evaluation Patient Details Name: Stephanie Ellis MRN: [id number] DOB: 10-24-1950 Today's Date: 04/14/2018   History of Present Illness  67 y/o F who presented from the [facility] after fall attempting to transfer and injury to b/l BKA stumps (L BKA 11/20, R 11/6), needing wound vac placement (L) and b/l I&Ds. PMH includes CABG x3, MI, ESRD on HD.  Clinical Impression  Pt lethargic and slow to answer/respond with cuing.  Daughter reports this is normal for her after anesthesia and she expects she will be better post dialysis.  Pt showed some effort in getting to dialysis chair with slide board but could not stay engaged enough to follow cuing and actually perform the transfer this date, needing mod assist to get there safe.  Per daughter she has been able to be relatively independent with transfers and propelling her w/c wherever she needed.  Per today's performance she is unable to do so and would benefit from rehab, however once mental status clears up I expect her to be able to do more and possibly be safe to return to ILF.    Follow Up Recommendations SNF(per progress, hoping she's safe to go home once AMS clears)    Equipment Recommendations  None recommended by PT    Recommendations for Other Services       Precautions / Restrictions Precautions Precautions: Fall Restrictions RLE Weight Bearing: Non weight bearing LLE Weight Bearing: Non weight bearing      Mobility  Bed Mobility Overal bed mobility: Needs Assistance             General bed mobility comments: Pt able to scoot some in the bed but overall was too weak and with AMS that she needed excessive assist to do minimal movement, very delayed responses despite cuing from daughter and PT  Transfers Overall transfer level: Needs assistance Equipment used: Sliding board Transfers: Lateral/Scoot Transfers          Lateral/Scoot Transfers: Mod assist General transfer comment: Pt was able to partially  place slide board and initiate some movement (does this independently at baseline), but ultimately needed considerable assistance to get to recliner with PT mod assist   Ambulation/Gait             General Gait Details: b/l BKAs last month, not currently able to attempt  Stairs            Wheelchair Mobility    Modified Rankin (Stroke Patients Only)       Balance Overall balance assessment: Modified Independent                                           Pertinent Vitals/Pain Pain Assessment: (Pt does not endorse significant pain t/o session)    Home Living Family/patient expects to be discharged to:: (per progress, hoping to go home will need to do more) Living Arrangements: ([facility])                    Prior Function Level of Independence: Independent with assistive device(s)         Comments: Pt has been getting self dressed, transferring, propelling self in w/c, etc and doing reasonably well     Hand Dominance        Extremity/Trunk Assessment   Upper Extremity Assessment Upper Extremity Assessment: Overall WFL for tasks assessed;Generalized weakness  Lower Extremity Assessment Lower Extremity Assessment: Generalized weakness;Overall [facility] for tasks assessed(has full TKA b/l, AROM, grossly 3+/5 t/o)       Communication   Communication: Expressive difficulties(pt still "probably effected by the anesthesia" until HD)  Cognition Arousal/Alertness: Suspect due to medications Behavior During Therapy: Flat affect Overall Cognitive Status: Difficult to assess                                 General Comments: Pt apologizes for "falling asleep" multiple times t/o the interaction.  Daughter reports that she generally is effected like this post anesthesia until she had dialysis      General Comments      Exercises     Assessment/Plan    PT Assessment Patient needs continued PT services  PT Problem List  Decreased strength;Decreased range of motion;Decreased activity tolerance;Decreased balance;Decreased mobility;Decreased coordination;Decreased knowledge of use of DME;Decreased safety awareness;Decreased cognition;Cardiopulmonary status limiting activity       PT Treatment Interventions DME instruction;Functional mobility training;Therapeutic activities;Therapeutic exercise;Balance training;Neuromuscular re-education;Cognitive remediation;Patient/family education    PT Goals (Current goals can be found in the Care Plan section)  Acute Rehab PT Goals Patient Stated Goal: go home PT Goal Formulation: With patient/family Time For Goal Achievement: 04/28/18 Potential to Achieve Goals: Good    Frequency Min 2X/week   Barriers to discharge        Co-evaluation               AM-PAC PT "6 Clicks" Mobility  Outcome Measure Help needed turning from your back to your side while in a flat bed without using bedrails?: A Little Help needed moving from lying on your back to sitting on the side of a flat bed without using bedrails?: A Little Help needed moving to and from a bed to a chair (including a wheelchair)?: A Lot Help needed standing up from a chair using your arms (e.g., wheelchair or bedside chair)?: Total Help needed to walk in hospital room?: Total Help needed climbing 3-5 steps with a railing? : Total 6 Click Score: 11    End of Session Equipment Utilized During Treatment: Gait belt Activity Tolerance: Patient limited by fatigue Patient left: in chair(in dialysis chair, ready for transfer)   PT Visit Diagnosis: Muscle weakness (generalized) (M62.81);Difficulty in walking, not elsewhere classified (R26.2)    Time: 1245-8099 PT Time Calculation (min) (ACUTE ONLY): 30 min   Charges:   PT Evaluation $PT Eval Low Complexity: 1 Low          Kreg Shropshire, DPT 04/14/2018, 3:44 PM

## 2018-04-14 NOTE — Progress Notes (Signed)
Patient blood pressure elevated over the night. Last BP 188/72. Notified MD and he ordered to hold morning Midodrine.

## 2018-04-15 LAB — PARATHYROID HORMONE, INTACT (NO CA): PTH: 23 pg/mL (ref 15–65)

## 2018-04-15 NOTE — Progress Notes (Signed)
[place] [facility] [place] NAME: Stephanie Ellis    MR#:  [id number]  DATE OF BIRTH:  03-01-1951  SUBJECTIVE:  postop day 2. Poor appetite. Procedure went well.  REVIEW OF SYSTEMS:   Review of Systems  Constitutional: Negative for chills, fever and weight loss.  HENT: Negative for ear discharge, ear pain and nosebleeds.   Eyes: Negative for blurred vision, pain and discharge.  Respiratory: Negative for sputum production, shortness of breath, wheezing and stridor.   Cardiovascular: Negative for chest pain, palpitations, orthopnea and PND.  Gastrointestinal: Negative for abdominal pain, diarrhea, nausea and vomiting.  Genitourinary: Negative for frequency and urgency.  Musculoskeletal: Negative for back pain and joint pain.  Neurological: Negative for sensory change, speech change, focal weakness and weakness.  Psychiatric/Behavioral: Negative for depression and hallucinations. The patient is not nervous/anxious.    Tolerating Diet:some Tolerating PT: BKA +  DRUG ALLERGIES:   Allergies  Allergen Reactions  . Chlorthalidone Anaphylaxis, Itching and Rash  . Fentanyl Rash  . Midazolam Rash  . Ace Inhibitors Other (See Comments)    Reaction:  Hyperkalemia, agitation   . Angiotensin Receptor Blockers Other (See Comments)    Reaction:  Hyperkalemia, agitation   . Norvasc [Amlodipine] Itching and Rash  . Phenergan [Promethazine Hcl] Anxiety    "antsy, can't sit still"    VITALS:  Blood pressure (!) 164/67, pulse 69, temperature 98.2 F (36.8 C), temperature source Oral, resp. rate 18, height 5' (1.524 m), weight 54.4 kg, SpO2 100 %.  PHYSICAL EXAMINATION:   Physical Exam  GENERAL:  67 y.o.-year-old patient lying in the bed with no acute distress.  EYES: Pupils equal, round, reactive to light and accommodation. No scleral icterus. Extraocular muscles intact.  HEENT: Head atraumatic, normocephalic. Oropharynx and nasopharynx clear.   NECK:  Supple, no jugular venous distention. No thyroid enlargement, no tenderness.  LUNGS: Normal breath sounds bilaterally, no wheezing, rales, rhonchi. No use of accessory muscles of respiration.  CARDIOVASCULAR: S1, S2 normal. No murmurs, rubs, or gallops.  ABDOMEN: Soft, nontender, nondistended. Bowel sounds present. No organomegaly or mass.  EXTREMITIES: bilateral knee amputation. Stump wound VAC and surgical dressing present  NEUROLOGIC: Cranial nerves II through XII are intact. No focal Motor or sensory deficits b/l.   PSYCHIATRIC:  patient is alert and oriented x 3.  SKIN: No obvious rash, lesion, or ulcer.   LABORATORY PANEL:  CBC Recent Labs  Lab 04/13/18 0903  WBC 8.5  HGB 10.0*  HCT 33.7*  PLT 266    Chemistries  Recent Labs  Lab 04/13/18 0903  NA 137  K 4.6  CL 100  CO2 27  GLUCOSE 167*  BUN 39*  CREATININE 5.48*  CALCIUM 10.1  AST 18  ALT 15  ALKPHOS 147*  BILITOT 0.4   Cardiac Enzymes No results for input(s): TROPONINI in the last 168 hours. RADIOLOGY:  No results found. ASSESSMENT AND PLAN:  Stephanie Ellis  is a 67 y.o. female with a known history of end-stage renal disease on hemodialysis, chronic anemia, bilateral below knee amputation due to severe peripheral vascular disease, carotid artery disease, hypertension comes to the emergency room from the [facility] of elements after patient fell while trying to slide on the board from her bed to the wheelchair. She started noticing some bleeding from the right amputation stump.  1. Bilateral below knee amputation secondary to severe peripheral arterial disease -patient sustained a fall this morning.  -s/p  bilateral BKA washout/revision/wound VAC  per vascular dr Lorenso Courier POD#2 -PRN pain meds  2. Anemia of chronic disease -hemoglobin 10.0 -transfuse as needed  3. Hypertension continue home meds   4. Type II diabetes sliding scale insulin  5. End-stage renal disease nephrology consultation placed with  Dr. Zollie Scale. Patient will be resume back on her hemodialysis  Social worker for discharge planning.  CODE STATUS: dnr  DVT Prophylaxis: eliquis TOTAL TIME TAKING CARE OF THIS PATIENT: *25* minutes.  >50% time spent on counselling and coordination of care  POSSIBLE D/C IN *2-3 DAYS, DEPENDING ON CLINICAL CONDITION.  Note: This dictation was prepared with Dragon dictation along with smaller phrase technology. Any transcriptional errors that result from this process are unintentional.  Fritzi Mandes M.D on 04/15/2018 at 12:20 PM  Between 7am to 6pm - Pager - [phone]  After 6pm go to [email or web address] - password [email or web address] [email or web address]  Office  [phone]  CC: Primary care physician; Hanley Ben, NPPatient ID: Stephanie Ellis, female   DOB: November 05, 1950, 67 y.o.   MRN: [id number]

## 2018-04-15 NOTE — Progress Notes (Signed)
2 Days Post-Op   Subjective/Chief Complaint: Doing OK. Minimal pain in LEFT stump. Tolerating PO. Denies nausea.   Objective: Vital signs in last 24 hours: Temp:  [98 F (36.7 C)-99.1 F (37.3 C)] 98.5 F (36.9 C) (12/25 0537) Pulse Rate:  [44-112] 63 (12/25 0537) Resp:  [10-24] 18 (12/25 0537) BP: (170-207)/(51-156) 182/61 (12/25 0537) SpO2:  [75 %-100 %] 100 % (12/25 0537) Last BM Date: 04/12/18  Intake/Output from previous day: 12/24 0701 - 12/25 0700 In: 315 [P.O.:315] Out: 1550 [Drains:50] Intake/Output this shift: No intake/output data recorded.  General appearance: alert and no distress Cardio: regular rate and rhythm GI: soft, non-tender; bowel sounds normal; no masses,  no organomegaly Extremities: Bilateral BKA stump dressings in place. LEFT VAC- good seal, scant output. RIGHT BKA- incision clean, intact, dry, soft, no erythema, no drainage.  Lab Results:  Recent Labs    04/13/18 0903  WBC 8.5  HGB 10.0*  HCT 33.7*  PLT 266   BMET Recent Labs    04/13/18 0903  NA 137  K 4.6  CL 100  CO2 27  GLUCOSE 167*  BUN 39*  CREATININE 5.48*  CALCIUM 10.1   PT/INR No results for input(s): LABPROT, INR in the last 72 hours. ABG No results for input(s): PHART, HCO3 in the last 72 hours.  Invalid input(s): PCO2, PO2  Studies/Results: No results found.  Anti-infectives: Anti-infectives (From admission, onward)   Start     Dose/Rate Route Frequency Ordered Stop   04/13/18 1349  bacitracin 50,000 Units in sodium chloride 0.9 % 500 mL irrigation  Status:  Discontinued       As needed 04/13/18 1350 04/13/18 1350   04/13/18 1145  ceFAZolin (ANCEF) IVPB 2g/100 mL premix     2 g 200 mL/hr over 30 Minutes Intravenous  Once 04/13/18 1141 04/13/18 1210   04/13/18 1131  ceFAZolin (ANCEF) 2-4 GM/100ML-% IVPB    Note to Pharmacy:  Stephanie Ellis  : cabinet override      04/13/18 1131 04/13/18 1210      Assessment/Plan: s/p Procedure(s): REVISION OF  BILATERAL AMPUTATIONS BELOW KNEE WITH WOUND VAC APPLICATIONS (Bilateral) APPLICATION OF WOUND VAC (Left) Will change would [facility] tomorrow- pending discharge.  Encourage PO. OK to discharge tomorrow/Friday with wound VAC.  LOS: 2 days    Jamesetta So A 04/15/2018

## 2018-04-15 NOTE — Progress Notes (Signed)
Clinical Social Worker (CSW) contacted patient's daughter [name] and presented bed offer. Per daughter she does not want [facility] which is the only bed offer at this time. Daughter prefers [facility] and they have not responded to referral yet. Per daughter patient has used about 2 weeks of SNF days. CSW explained to daughter that patient will be in her co-pays soon and her supplement [employer] should pick up the co-pays. Daughter thanked CSW for calling.   [employer], LCSW [phone]

## 2018-04-15 NOTE — Progress Notes (Signed)
[facility]  ROUNDING NOTE   Subjective:  Patient completed hemodialysis yesterday. Due for dialysis again tomorrow. Appears to be in good spirits.   Objective:  Vital signs in last 24 hours:  Temp:  [98.2 F (36.8 C)-98.5 F (36.9 C)] 98.2 F (36.8 C) (12/25 1206) Pulse Rate:  [44-112] 69 (12/25 1206) Resp:  [10-24] 18 (12/25 1206) BP: (164-207)/(51-156) 164/67 (12/25 1206) SpO2:  [75 %-100 %] 100 % (12/25 1206)  Weight change:  Filed Weights   04/13/18 0809  Weight: 54.4 kg    Intake/Output: I/O last 3 completed shifts: In: 36 [P.O.:555] Out: 1550 [Drains:50; Other:1500]   Intake/Output this shift:  No intake/output data recorded.  Physical Exam: General: NAD, laying in the bed  Head: Normocephalic, atraumatic. Moist oral mucosal membranes  Eyes: Anicteric,   Neck: Supple,  Lungs:  Normal breathing effort, [place] O2  Heart: Regular rate and rhythm  Abdomen:  Soft, nontender, BS present  Extremities:  bilateral BKA, wound vac in place  Neurologic:  Alert, able to answer questions appropriately  Access:  Left IJ permcath    Basic Metabolic Panel: Recent Labs  Lab 04/13/18 0903 04/14/18 1507  NA 137  --   K 4.6  --   CL 100  --   CO2 27  --   GLUCOSE 167*  --   BUN 39*  --   CREATININE 5.48*  --   CALCIUM 10.1  --   PHOS  --  1.8*    Liver Function Tests: Recent Labs  Lab 04/13/18 0903  AST 18  ALT 15  ALKPHOS 147*  BILITOT 0.4  PROT 7.9  ALBUMIN 3.2*   No results for input(s): LIPASE, AMYLASE in the last 168 hours. No results for input(s): AMMONIA in the last 168 hours.  CBC: Recent Labs  Lab 04/13/18 0903  WBC 8.5  NEUTROABS 5.9  HGB 10.0*  HCT 33.7*  MCV 100.0  PLT 266    Cardiac Enzymes: No results for input(s): CKTOTAL, CKMB, CKMBINDEX, TROPONINI in the last 168 hours.  BNP: Invalid input(s): POCBNP  CBG: Recent Labs  Lab 04/13/18 1115 04/13/18 1340 04/14/18 1605  GLUCAP 145* 142* 114*     Microbiology: Results for orders placed or performed during the hospital encounter of 04/13/18  Aerobic/Anaerobic Culture (surgical/deep wound)     Status: None (Preliminary result)   Collection Time: 04/13/18 12:10 PM  Result Value Ref Range Status   Specimen Description   Final    WOUND left below knee amputation Performed at [facility], [address], [place], [place] [address]    Special Requests   Final    NONE Performed at [facility], [address] [place], [place] [address]    Gram Stain   Final    MODERATE WBC PRESENT, PREDOMINANTLY PMN MODERATE GRAM POSITIVE COCCI Performed at [facility], [place] [address] [place], [place] [address]    Culture   Final    CULTURE REINCUBATED FOR BETTER GROWTH NO ANAEROBES ISOLATED; CULTURE IN PROGRESS FOR 5 DAYS    Report Status PENDING  Incomplete    Coagulation Studies: No results for input(s): LABPROT, INR in the last 72 hours.  Urinalysis: No results for input(s): COLORURINE, LABSPEC, PHURINE, GLUCOSEU, HGBUR, BILIRUBINUR, KETONESUR, PROTEINUR, UROBILINOGEN, NITRITE, LEUKOCYTESUR in the last 72 hours.  Invalid input(s): APPERANCEUR    Imaging: No results found.   Medications:    . apixaban  2.5 mg Oral BID  . atorvastatin  80  mg Oral QHS  . brimonidine  1 drop Both Eyes TID  . calcitRIOL  0.5 mcg Oral Daily  . carvedilol  6.25 mg Oral BID  . Chlorhexidine Gluconate Cloth  6 each Topical Q0600  . docusate sodium  100 mg Oral BID  . epoetin (EPOGEN/PROCRIT) injection  4,000 Units Intravenous Q T,Th,Sa-HD  . famotidine  20 mg Oral BID  . folic acid  1 mg Oral Daily  . hydrALAZINE  25 mg Oral Q8H  . isosorbide mononitrate  60 mg Oral Daily  . levETIRAcetam  250 mg Oral Q T,Th,Sat-1800  . levETIRAcetam  500 mg Oral BID  . midodrine  10 mg Oral Once per day on Tue Thu Sat  . multivitamin with minerals  1 tablet Oral Daily  . omega-3 acid ethyl esters  2 capsule Oral Daily  .  rOPINIRole  0.25 mg Oral QHS  . sevelamer carbonate  800 mg Oral TID WC  . timolol  1 drop Both Eyes TID  . vitamin B-12  1,000 mcg Oral Daily   acetaminophen **OR** acetaminophen, acetaminophen, nitroGLYCERIN, ondansetron **OR** ondansetron (ZOFRAN) IV, polyethylene glycol, traMADol  Assessment/ Plan:  [name] is a 67 y.o. 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  [facility] [place] T[facility] hemodialysis IJ permcath  1. End Stage Renal Disease  Patient completed hemodialysis yesterday.  No acute indication for dialysis today.  We will plan for hemodialysis again tomorrow.  2.  Ischemic limb with osteomyelitis and endocarditis.  Status post BKA on 11/20 with subsequent right and left lower extremity BKA washout on April 13, 2018.  Patient has wound VAC in left lower extremity.  3.  Anemia of chronic kidney disease.  Hemoglobin currently 10.0.  Continue Epogen with dialysis.  4.  Secondary hyperparathyroidism.  Phosphorus was actually noted to be low yesterday 1.8.  Repeat serum phosphorus tomorrow.    LOS: 2 Stephanie Ellis 12/25/20192:05 PM

## 2018-04-16 LAB — PHOSPHORUS: Phosphorus: 3.3 mg/dL (ref 2.5–4.6)

## 2018-04-16 NOTE — Consult Note (Signed)
[place] Nurse wound consult note Patient receiving care in [facility] 223.  No family present.  I understand the surgeon removed the VAC dressing this morning in preparation for the patient's discharge.  Val Riles, RN, MSN, CWOCN, CNS-BC, pager [phone]

## 2018-04-16 NOTE — Progress Notes (Signed)
[facility]  ROUNDING NOTE   Subjective:  Wound VAC taken out of the left lower extremity. Patient due for hemodialysis today.  Orders have been prepared.   Objective:  Vital signs in last 24 hours:  Temp:  [97.6 F (36.4 C)-98 F (36.7 C)] 97.6 F (36.4 C) (12/26 1202) Pulse Rate:  [54-69] 58 (12/26 1315) Resp:  [16-22] 21 (12/26 1315) BP: (134-205)/(57-160) 199/160 (12/26 1315) SpO2:  [97 %-100 %] 100 % (12/26 1315) Weight:  [55 kg] 55 kg (12/26 1202)  Weight change:  Filed Weights   04/13/18 0809 04/16/18 1202  Weight: 54.4 kg 55 kg    Intake/Output: I/O last 3 completed shifts: In: 120 [P.O.:120] Out: 0    Intake/Output this shift:  Total I/O In: 360 [P.O.:360] Out: -   Physical Exam: General: NAD, laying in the bed  Head: Normocephalic, atraumatic. Moist oral mucosal membranes  Eyes: Anicteric,   Neck: Supple,  Lungs:  Normal breathing effort, [place] O2  Heart: Regular rate and rhythm  Abdomen:  Soft, nontender, BS present  Extremities: bilateral BKA  Neurologic: Alert, able to answer questions appropriately  Access: Left IJ permcath    Basic Metabolic Panel: Recent Labs  Lab 04/13/18 0903 04/14/18 1507  NA 137  --   K 4.6  --   CL 100  --   CO2 27  --   GLUCOSE 167*  --   BUN 39*  --   CREATININE 5.48*  --   CALCIUM 10.1  --   PHOS  --  1.8*    Liver Function Tests: Recent Labs  Lab 04/13/18 0903  AST 18  ALT 15  ALKPHOS 147*  BILITOT 0.4  PROT 7.9  ALBUMIN 3.2*   No results for input(s): LIPASE, AMYLASE in the last 168 hours. No results for input(s): AMMONIA in the last 168 hours.  CBC: Recent Labs  Lab 04/13/18 0903  WBC 8.5  NEUTROABS 5.9  HGB 10.0*  HCT 33.7*  MCV 100.0  PLT 266    Cardiac Enzymes: No results for input(s): CKTOTAL, CKMB, CKMBINDEX, TROPONINI in the last 168 hours.  BNP: Invalid input(s): POCBNP  CBG: Recent Labs  Lab 04/13/18 1115 04/13/18 1340 04/14/18 1605  GLUCAP 145* 142*  114*    Microbiology: Results for orders placed or performed during the hospital encounter of 04/13/18  Aerobic/Anaerobic Culture (surgical/deep wound)     Status: None (Preliminary result)   Collection Time: 04/13/18 12:10 PM  Result Value Ref Range Status   Specimen Description   Final    WOUND left below knee amputation Performed at [facility], [address], [place], [place] [address]    Special Requests   Final    NONE Performed at [facility], [address] [place], [place] [address]    Gram Stain   Final    MODERATE WBC PRESENT, PREDOMINANTLY PMN MODERATE GRAM POSITIVE COCCI Performed at [facility], [place] [address] [place], [place] [address]    Culture   Final    CULTURE REINCUBATED FOR BETTER GROWTH NO ANAEROBES ISOLATED; CULTURE IN PROGRESS FOR 5 DAYS    Report Status PENDING  Incomplete    Coagulation Studies: No results for input(s): LABPROT, INR in the last 72 hours.  Urinalysis: No results for input(s): COLORURINE, LABSPEC, PHURINE, GLUCOSEU, HGBUR, BILIRUBINUR, KETONESUR, PROTEINUR, UROBILINOGEN, NITRITE, LEUKOCYTESUR in the last 72 hours.  Invalid input(s): APPERANCEUR    Imaging: No results found.   Medications:    .  apixaban  2.5 mg Oral BID  . atorvastatin  80 mg Oral QHS  . brimonidine  1 drop Both Eyes TID  . calcitRIOL  0.5 mcg Oral Daily  . carvedilol  6.25 mg Oral BID  . Chlorhexidine Gluconate Cloth  6 each Topical Q0600  . docusate sodium  100 mg Oral BID  . epoetin (EPOGEN/PROCRIT) injection  4,000 Units Intravenous Q T,Th,Sa-HD  . famotidine  20 mg Oral BID  . folic acid  1 mg Oral Daily  . hydrALAZINE  25 mg Oral Q8H  . isosorbide mononitrate  60 mg Oral Daily  . levETIRAcetam  250 mg Oral Q T,Th,Sat-1800  . levETIRAcetam  500 mg Oral BID  . midodrine  10 mg Oral Once per day on Tue Thu Sat  . multivitamin with minerals  1 tablet Oral Daily  . omega-3 acid ethyl esters  2 capsule Oral Daily  .  rOPINIRole  0.25 mg Oral QHS  . sevelamer carbonate  800 mg Oral TID WC  . timolol  1 drop Both Eyes TID  . vitamin B-12  1,000 mcg Oral Daily   acetaminophen **OR** acetaminophen, acetaminophen, nitroGLYCERIN, ondansetron **OR** ondansetron (ZOFRAN) IV, polyethylene glycol, traMADol  Assessment/ Plan:  [name] is a 67 y.o. 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  [facility] [place] TTS hemodialysis IJ permcath  1. End Stage Renal Disease  Patient seen and evaluated at bedside.  She is due for dialysis today.  Orders have been prepared.  2.  Ischemic limb with osteomyelitis and endocarditis.  Status post BKA on 11/20 with subsequent right and left lower extremity BKA washout on April 13, 2018.  Wound vac has been taken out.   3.  Anemia of chronic kidney disease.  Maintain the patient on erythropoietin stimulating agents as an outpatient.  4.  Secondary hyperparathyroidism.  Phosphorus actually low.  Discontinue Renvela at this time.    LOS: 3 Stephanie Ellis 12/26/20191:41 PM

## 2018-04-16 NOTE — Progress Notes (Signed)
HD tx end    04/16/18 1607  Vital Signs  Pulse Rate 63  Pulse Rate Source Monitor  Resp 20  BP (!) 212/74  BP Location Right Arm  BP Method Automatic  Patient Position (if appropriate) Lying  Oxygen Therapy  SpO2 100 %  O2 Device Room Air  During Hemodialysis Assessment  Dialysis Fluid Bolus Normal Saline  Bolus Amount (mL) 250 mL  Intra-Hemodialysis Comments Tx completed

## 2018-04-16 NOTE — Discharge Summary (Signed)
[facility] NAME: Stephanie Ellis    MR#:  [id number]  DATE OF BIRTH:  30-Nov-1950  DATE OF ADMISSION:  04/13/2018 ADMITTING PHYSICIAN: Fritzi Mandes, MD  DATE OF DISCHARGE: 04/16/2018  PRIMARY CARE PHYSICIAN: Hanley Ben, NP    ADMISSION DIAGNOSIS:  Wound dehiscence [T81.30XA]  DISCHARGE DIAGNOSIS:  bilateral amputation Ellis complications status post-- surgery.  SECONDARY DIAGNOSIS:   Past Medical History:  Diagnosis Date  . (HFpEF) heart failure with preserved ejection fraction ([place])    a. 01/2018 Echo: EF 50-55%, no rwma, Gr1 DD, triv AI, mild MR. Midly dil LA/RV, mod reduced RV fxn. Irreg thickening of TV w/ mobile echodensity that appears to arise from valve.  . Anemia   . Anorexia   . Bacteremia due to Pseudomonas   . Carotid arterial disease ([place])    a. 01/2017 Carotid U/S: RICA <46, LICA <65.  Marland Kitchen Complication of anesthesia    a. Pt reports h/o complication on 9 different occasions - ? hypotension/arrest.  . 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 01/2015 Cath [facility]): 70% LM, p-mLAD 50-60% (Resting FFR 0.75), mRCA 80-90%, ~40 Ost OM & D1-->CABG; b. 01/2016 Staged PCI of LCX x 2 and RCA.  Marland Kitchen ESRD (end stage renal disease) on dialysis ([place])    a. ESRD secondary to acute kidney failure s/p CABG-->PD  . Essential hypertension   . GERD (gastroesophageal reflux disease)   . Heart murmur   . Hypercholesterolemia   . Myocardial infarction ([place]) 2016  . PVD (peripheral vascular disease) ([place])    a. 01/30/2018 PV Angio: Sev Left Popliteal dzs s/p PTA w/ 17m lutonix DEB w/ mech aspiration of the L popliteal, L AT, and Left tibioperoneal trunck and peroneal artery.  . 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  [facility]: Dr. BMarland KitchenHaithcock: CABG x 3, LIMA to LAD, SVG to RCA, SVG to OM3, EVH  . Sinusitis 2019  . 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 She started noticing some bleeding from the right amputation Ellis.  1.Bilateral below knee amputation secondary to severe peripheral arterial disease -patient sustained a fall at the facility.  -s/p  bilateral BKA washout/revision/wound VAC per vascular dr ELorenso CourierPOD# 3 -PRN pain meds okay from vascular standpoint to be discharge. Dressing change and wound VAC changes per Dr. ELorenso Courier 2.Anemia of chronic disease -hemoglobin 10.0 -transfuse as needed  3.Hypertension continue home meds   4.Type II diabetes sliding scale insulin  5.End-stage renal disease nephrology consultation placed with Dr. LZollie Scale Patient will be resume back on her hemodialysis -patient will get dialysis today.  Social worker for discharge planning- patient wound VAC will be delivered tomorrow to facility. Okay to continue with dry dressing per vascular. Patient will discharged today.  CODE STATUS: dnr  CONSULTS OBTAINED:  Treatment Team:  EEvaristo Bury MD  DRUG ALLERGIES:   Allergies  Allergen Reactions  . Chlorthalidone Anaphylaxis, Itching and Rash  . Fentanyl Rash  . Midazolam Rash  . Ace Inhibitors Other (See Comments)    Reaction:  Hyperkalemia, agitation   . Angiotensin Receptor Blockers Other (See Comments)    Reaction:  Hyperkalemia, agitation   . Norvasc [Amlodipine] Itching and Rash  . Phenergan [Promethazine Hcl] Anxiety    "antsy, can't sit still"    DISCHARGE MEDICATIONS:   Allergies as of 04/16/2018      Reactions   Chlorthalidone Anaphylaxis, Itching, Rash   Fentanyl Rash   Midazolam Rash   Ace Inhibitors  Other (See Comments)   Reaction:  Hyperkalemia, agitation    Angiotensin Receptor Blockers Other (See Comments)   Reaction:  Hyperkalemia, agitation    Norvasc [amlodipine] Itching, Rash   Phenergan [promethazine Hcl] Anxiety   "antsy, can't sit still"      Medication List    TAKE these medications   acetaminophen 325 MG tablet Commonly known as:  TYLENOL Take 2 tablets (650 mg total) by mouth every 6 (six) hours as needed for mild pain (or Fever >/= 101).   apixaban 2.5 MG Tabs tablet Commonly known as:  ELIQUIS Take 1 tablet (2.5 mg total) by mouth 2 (two) times daily.   aspirin EC 81 MG tablet Take 1 tablet (81 mg total) by mouth daily.   atorvastatin 80 MG tablet Commonly known as:  LIPITOR Take 80 mg by mouth at bedtime.   brimonidine 0.2 % ophthalmic solution Commonly known as:  ALPHAGAN Place 1 drop into both eyes 3 (three) times daily.   calcitRIOL 0.5 MCG capsule Commonly known as:  ROCALTROL Take 0.5 mcg by mouth daily.   carvedilol 6.25 MG tablet Commonly known as:  COREG Take 1 tablet (6.25 mg total) by mouth 2 (two) times daily.   docusate sodium 100 MG capsule Commonly known as:  COLACE Take 1 capsule (100 mg total) by mouth 2 (two) times daily.   doxycycline 100 MG capsule Commonly known as:  VIBRAMYCIN Take 1 capsule (100 mg total) by mouth 2 (two) times daily.   epoetin alfa 10000 UNIT/ML injection Commonly known as:  EPOGEN,PROCRIT Inject 1 mL (10,000 Units total) into the vein Every Tuesday,Thursday,and Saturday with dialysis.   famotidine 20 MG tablet Commonly known as:  PEPCID Take 20 mg by mouth 2 (two) times daily.   folic acid 1 MG tablet Commonly known as:  FOLVITE Take 1 tablet (1 mg total) by mouth daily.   isosorbide mononitrate 30 MG 24 hr tablet Commonly known as:  IMDUR Take 30 mg by mouth daily.   levETIRAcetam 500 MG tablet Commonly known as:  KEPPRA Take 1 tablet (500 mg total) by mouth 2 (two) times daily.    levETIRAcetam 250 MG tablet Commonly known as:  KEPPRA Take 1 tablet (250 mg total) by mouth 3 (three) times a week.   midodrine 10 MG tablet Commonly known as:  PROAMATINE Take 1 tablet (10 mg total) by mouth 3 (three) times a week. What changed:  additional instructions   mirtazapine 7.5 MG tablet Commonly known as:  REMERON Take by mouth.   multivitamin with minerals Tabs tablet Take 2 tablets by mouth daily. Gummy vitamins   nitroGLYCERIN 0.4 MG SL tablet Commonly known as:  NITROSTAT Place 1 tablet (0.4 mg total) under the tongue every 5 (five) minutes as needed for chest pain.   Omega-3 1000 MG Caps Take 2 capsules by mouth daily.   ondansetron 4 MG tablet Commonly known as:  ZOFRAN Take 4 mg by mouth every 8 (eight) hours as needed for nausea or vomiting.   Polyethyl Glycol-Propyl Glycol 0.4-0.3 % Soln Place  1 drop into both eyes 2 (two) times daily.   PRORENAL + D Tabs Take 1 tablet by mouth daily.   rOPINIRole 0.25 MG tablet Commonly known as:  REQUIP Take by mouth.   sevelamer carbonate 800 MG tablet Commonly known as:  RENVELA Take 800 mg by mouth 3 (three) times daily with meals.   timolol 0.5 % ophthalmic solution Commonly known as:  TIMOPTIC Place 1 drop into both eyes 3 (three) times daily.   traMADol 50 MG tablet Commonly known as:  ULTRAM Take 1 tablet (50 mg total) by mouth every 8 (eight) hours as needed for moderate pain or severe pain.   valsartan 80 MG tablet Commonly known as:  DIOVAN Take by mouth.   vitamin B-12 1000 MCG tablet Commonly known as:  CYANOCOBALAMIN Take 1 tablet (1,000 mcg total) by mouth daily.       If you experience worsening of your admission symptoms, develop shortness of breath, life threatening emergency, suicidal or homicidal thoughts you must seek medical attention immediately by calling 911 or calling your MD immediately  if symptoms less severe.  You Must read complete instructions/literature along with  all the possible adverse reactions/side effects for all the Medicines you take and that have been prescribed to you. Take any new Medicines after you have completely understood and accept all the possible adverse reactions/side effects.   Please note  You were cared for by a hospitalist during your hospital stay. If you have any questions about your discharge medications or the care you received while you were in the hospital after you are discharged, you can call the unit and asked to speak with the hospitalist on call if the hospitalist that took care of you is not available. Once you are discharged, your primary care physician will handle any further medical issues. Please note that NO REFILLS for any discharge medications will be authorized once you are discharged, as it is imperative that you return to your primary care physician (or establish a relationship with a primary care physician if you do not have one) for your aftercare needs so that they can reassess your need for medications and monitor your lab values.  DATA REVIEW:   CBC  Recent Labs  Lab 04/13/18 0903  WBC 8.5  HGB 10.0*  HCT 33.7*  PLT 266    Chemistries  Recent Labs  Lab 04/13/18 0903  NA 137  K 4.6  CL 100  CO2 27  GLUCOSE 167*  BUN 39*  CREATININE 5.48*  CALCIUM 10.1  AST 18  ALT 15  ALKPHOS 147*  BILITOT 0.4    Microbiology Results   Recent Results (from the past 240 hour(s))  Aerobic/Anaerobic Culture (surgical/deep wound)     Status: None (Preliminary result)   Collection Time: 04/13/18 12:10 PM  Result Value Ref Range Status   Specimen Description   Final    WOUND left below knee amputation Performed at [facility], [address] [place], [place] [address]    Special Requests   Final    NONE Performed at [facility], [address] [place], [place] [address]    Gram Stain   Final    MODERATE WBC PRESENT, PREDOMINANTLY PMN MODERATE GRAM POSITIVE COCCI Performed  at [facility], [place] [address] [place], [place] [address]    Culture   Final    CULTURE REINCUBATED FOR BETTER GROWTH NO ANAEROBES ISOLATED; CULTURE IN PROGRESS FOR 5 DAYS    Report  Status PENDING  Incomplete    RADIOLOGY:  No results found.   CODE STATUS:     Code Status Orders  (From admission, onward)         Start     Ordered   04/13/18 1449  Do not attempt resuscitation (DNR)  Continuous    Question Answer Comment  In the event of cardiac or respiratory ARREST Do not call a "code blue"   In the event of cardiac or respiratory ARREST Do not perform Intubation, CPR, defibrillation or ACLS   In the event of cardiac or respiratory ARREST Use medication by any route, position, wound care, and other measures to relive pain and suffering. May use oxygen, suction and manual treatment of airway obstruction as needed for comfort.   Comments DNI      04/13/18 1448        Code Status History    Date Active Date Inactive Code Status Order ID Comments User Context   03/19/2018 1215 03/23/2018 1906 DNR [id number]  Fritzi Mandes, MD Inpatient   03/14/2018 2310 03/18/2018 2018 DNR [id number]  Erlene Quan, NP Inpatient   03/10/2018 0837 03/14/2018 2310 Full Code [id number]  Gladstone Lighter, MD Inpatient   02/18/2018 1000 02/28/2018 2122 Full Code [id number]  Loletha Grayer, MD ED   02/01/2018 0412 02/15/2018 2250 Full Code [id number]  Arta Silence, MD Inpatient   01/31/2018 2300 02/01/2018 0412 DNR [id number]  Amelia Jo, MD Inpatient   01/31/2018 2037 01/31/2018 2300 DNR [id number]  Awilda Bill, NP Inpatient   01/29/2018 2250 01/31/2018 2037 Full Code [id number]  Amelia Jo, MD Inpatient   10/24/2016 1055 10/25/2016 2243 Full Code [id number]  Hillary Bow, MD ED   10/17/2016 2143 10/19/2016 1440 Full Code [id number]  Vaughan Basta, MD Inpatient   09/04/2016 1708 09/05/2016 1500 Full Code [id number]  Epifanio Lesches, MD ED   08/22/2016 0938 08/22/2016  1833 Full Code [id number]  Algernon Huxley, MD Inpatient   06/27/2016 1121 06/27/2016 1846 Full Code [id number]  Algernon Huxley, MD Inpatient   10/03/2015 2235 10/04/2015 1724 Full Code [id number]  Harrie Foreman, MD Inpatient    Advance Directive Documentation     Most Recent Value  Type of Advance Directive  Out of facility DNR (pink MOST or yellow form)  Pre-existing out of facility DNR order (yellow form or pink MOST form)  Yellow form placed in chart (order not valid for inpatient use)  "MOST" Form in Place?  -      TOTAL TIME TAKING CARE OF THIS PATIENT: **40* minutes.    Fritzi Mandes M.D on 04/16/2018 at 1:24 PM  Between 7am to 6pm - Pager - [phone] After 6pm go to [email or web address] - password [email or web address] [facility]  Office  [phone]  CC: Primary care physician; Hanley Ben, NP

## 2018-04-16 NOTE — Progress Notes (Signed)
Pre HD assessment    04/16/18 1203  Neurological  Level of Consciousness Alert  Orientation Level Oriented X4  Respiratory  Respiratory Pattern Regular;Unlabored  Chest Assessment Chest expansion symmetrical  Cardiac  ECG Monitor Yes  Cardiac Rhythm SB  Vascular  R Radial Pulse +2  L Radial Pulse +2  Edema Generalized;Facial  Integumentary  Integumentary (WDL) X  Skin Color Appropriate for ethnicity  Musculoskeletal  Musculoskeletal (WDL) X  Generalized Weakness Yes  Assistive Device None  GU Assessment  Genitourinary (WDL) X  Genitourinary Symptoms  (HD)  Psychosocial  Psychosocial (WDL) WDL

## 2018-04-16 NOTE — Care Management (Signed)
Notified [name] with patient pathways of discharge to [facility]

## 2018-04-16 NOTE — Clinical Social Work Note (Signed)
Patient to discharge today to [facility] for wound vac management. Magda Paganini at [facility] stated that they have ordered the wound vac and it should be there tomorrow. Patient's nurse contacted the surgeon to get approval for wet to dry dressing changes until the vac gets to the facility tomorrow. Discharge information sent to [facility]. Daughter, [name], is aware and in agreement with discharge as [facility] was her first preference. Shela Leff MSW,LCSW [phone]

## 2018-04-16 NOTE — Care Management Important Message (Signed)
Important Message  Patient Details  Name: Stephanie Ellis MRN: [id number] Date of Birth: [birth year]   Medicare Important Message Given:  Yes    Juliann Pulse A Maleigh Bagot 04/16/2018, 11:53 AM

## 2018-04-16 NOTE — Progress Notes (Signed)
Physical Therapy Treatment Patient Details Name: Stephanie Ellis MRN: [id number] DOB: 03/12/1951 Today's Date: 04/16/2018    History of Present Illness 67 y/o F who presented from the [facility] after fall attempting to transfer and injury to b/l BKA stumps (L BKA 11/20, R 11/6), needing wound vac placement (L) and b/l I&Ds. PMH includes CABG x3, MI, ESRD on HD.    PT Comments    Pt awake in bed.  Pt reported wound vac was removed on LLE this am and she is hesitant to do much activity with it.  Stated she has been sitting in chair for dialysis and transfers remain difficulty but she is able to do with assist.  She refuses to practice during session today due to fear of injury to LLE.  Chart reviewed prior with no new limitations in mobility (NWB BLE) and wound vac was removed for anticipated discharge to SNF.  She did participate in supine exercises but was cautious and guarded with LLE.  Declined sitting on EOB or further functional bed level activities.  Will continue as appropriate.   Follow Up Recommendations  SNF     Equipment Recommendations  None recommended by PT    Recommendations for Other Services       Precautions / Restrictions Precautions Precautions: Fall Restrictions Weight Bearing Restrictions: Yes RLE Weight Bearing: Non weight bearing LLE Weight Bearing: Non weight bearing    Mobility  Bed Mobility               General bed mobility comments: refused  Transfers                 General transfer comment: refused  Ambulation/Gait                 Stairs             Wheelchair Mobility    Modified Rankin (Stroke Patients Only)       Balance                                            Cognition Arousal/Alertness: Awake/alert Behavior During Therapy: WFL for tasks assessed/performed Overall Cognitive Status: Within Functional Limits for tasks assessed                                         Exercises Other Exercises Other Exercises: BLE for quad, glut sets, SLR, hip/knee flexion and ab/add 2 x 10    General Comments        Pertinent Vitals/Pain Pain Assessment: Faces Faces Pain Scale: Hurts even more Pain Location: LLE Pain Descriptors / Indicators: Guarding;Aching;Sore Pain Intervention(s): Limited activity within patient's tolerance;Monitored during session    Home Living                      Prior Function            PT Goals (current goals can now be found in the care plan section) Progress towards PT goals: Progressing toward goals    Frequency    Min 2X/week      PT Plan      Co-evaluation              AM-PAC PT "6 Clicks" Mobility   Outcome Measure  Help  needed turning from your back to your side while in a flat bed without using bedrails?: A Little Help needed moving from lying on your back to sitting on the side of a flat bed without using bedrails?: A Little Help needed moving to and from a bed to a chair (including a wheelchair)?: A Lot Help needed standing up from a chair using your arms (e.g., wheelchair or bedside chair)?: Total Help needed to walk in hospital room?: Total Help needed climbing 3-5 steps with a railing? : Total 6 Click Score: 11    End of Session   Activity Tolerance: Patient tolerated treatment well;Other (comment) Patient left: in bed;with call bell/phone within reach;with bed alarm set         Time: 7944-4619 PT Time Calculation (min) (ACUTE ONLY): 10 min  Charges:  $Therapeutic Exercise: 8-22 mins                     [name], PTA 04/16/18, 11:07 AM

## 2018-04-16 NOTE — Discharge Instructions (Signed)
RIGHT stump: dry dressing- 4x4, Kerlix and ACE daily LEFT stump: Black sponge Wound VAC, 16mHG place then change 3x/week

## 2018-04-16 NOTE — Progress Notes (Addendum)
Discharge order received. Patient is alert and oriented. Vital signs stable . No signs of acute distress. Dr. Tonia Brooms was aware and okay that wound vac will be applied tomorrow when available at facility. Discharge instructions given. Patient verbalized understanding. No other issues noted at this time. Transported to Radiation protection practitioner by EMS. Daughter ([name])was aware of the discharge. Report given to Angela,nurse at [facility].

## 2018-04-16 NOTE — Progress Notes (Signed)
Post HD assessment. PT tolerated tx well without c/o or complication. Net UF 1030, goal met.    04/16/18 1612  Vital Signs  Temp (!) 97.5 F (36.4 C)  Temp Source Oral  Pulse Rate 66  Pulse Rate Source Monitor  Resp 16  BP (!) 216/88  BP Location Right Arm  BP Method Automatic  Patient Position (if appropriate) Lying  Oxygen Therapy  SpO2 100 %  O2 Device Room Air  Dialysis Weight  Weight 54.2 kg  Type of Weight Post-Dialysis  Post-Hemodialysis Assessment  Rinseback Volume (mL) 250 mL  KECN 78.1 V  Dialyzer Clearance Lightly streaked  Duration of HD Treatment -hour(s) 3.5 hour(s)  Hemodialysis Intake (mL) 500 mL  UF Total -Machine (mL) 1530 mL  Net UF (mL) 1030 mL  Tolerated HD Treatment Yes  Education / Care Plan  Dialysis Education Provided Yes  Documented Education in Care Plan Yes  Hemodialysis Catheter Left Subclavian Double-lumen  Placement Date: 02/11/18   Placed prior to admission: No  Orientation: Left  Access Location: Subclavian  Hemodialysis Catheter Type: Double-lumen  Site Condition No complications  Blue Lumen Status Heparin locked  Red Lumen Status Heparin locked  Purple Lumen Status N/A  Catheter fill solution Heparin 1000 units/ml  Catheter fill volume (Arterial) 1.7 cc  Catheter fill volume (Venous) 1.7  Dressing Type Biopatch  Dressing Status Clean;Dry;Intact  Drainage Description None  Post treatment catheter status Capped and Clamped

## 2018-04-16 NOTE — Progress Notes (Signed)
Floor RN unavailable to give report, will call this nurse back to give report.    04/16/18 1051  Hand-Off documentation  Report given to (Full Name) Stark Bray  Report received from (Full Name) Jeannette How

## 2018-04-16 NOTE — Progress Notes (Signed)
[facility] [place] NAME: Stephanie Ellis    MR#:  [id number]  DATE OF BIRTH:  1951-03-28  SUBJECTIVE:  postop day 3 procedure went well.  REVIEW OF SYSTEMS:   Review of Systems  Constitutional: Negative for chills, fever and weight loss.  HENT: Negative for ear discharge, ear pain and nosebleeds.   Eyes: Negative for blurred vision, pain and discharge.  Respiratory: Negative for sputum production, shortness of breath, wheezing and stridor.   Cardiovascular: Negative for chest pain, palpitations, orthopnea and PND.  Gastrointestinal: Negative for abdominal pain, diarrhea, nausea and vomiting.  Genitourinary: Negative for frequency and urgency.  Musculoskeletal: Negative for back pain and joint pain.  Neurological: Negative for sensory change, speech change, focal weakness and weakness.  Psychiatric/Behavioral: Negative for depression and hallucinations. The patient is not nervous/anxious.    Tolerating Diet:some Tolerating PT: BKA +  DRUG ALLERGIES:   Allergies  Allergen Reactions  . Chlorthalidone Anaphylaxis, Itching and Rash  . Fentanyl Rash  . Midazolam Rash  . Ace Inhibitors Other (See Comments)    Reaction:  Hyperkalemia, agitation   . Angiotensin Receptor Blockers Other (See Comments)    Reaction:  Hyperkalemia, agitation   . Norvasc [Amlodipine] Itching and Rash  . Phenergan [Promethazine Hcl] Anxiety    "antsy, can't sit still"    VITALS:  Blood pressure (!) 175/77, pulse 69, temperature 98 F (36.7 C), temperature source Oral, resp. rate 19, height 5' (1.524 m), weight 54.4 kg, SpO2 97 %.  PHYSICAL EXAMINATION:   Physical Exam  GENERAL:  67 y.o.-year-old patient lying in the bed with no acute distress.  EYES: Pupils equal, round, reactive to light and accommodation. No scleral icterus. Extraocular muscles intact.  HEENT: Head atraumatic, normocephalic. Oropharynx and nasopharynx clear.  NECK:  Supple, no  jugular venous distention. No thyroid enlargement, no tenderness.  LUNGS: Normal breath sounds bilaterally, no wheezing, rales, rhonchi. No use of accessory muscles of respiration.  CARDIOVASCULAR: S1, S2 normal. No murmurs, rubs, or gallops.  ABDOMEN: Soft, nontender, nondistended. Bowel sounds present. No organomegaly or mass.  EXTREMITIES: bilateral knee amputation. Stump wound VAC and surgical dressing present  NEUROLOGIC: Cranial nerves II through XII are intact. No focal Motor or sensory deficits b/l.   PSYCHIATRIC:  patient is alert and oriented x 3.  SKIN: No obvious rash, lesion, or ulcer.   LABORATORY PANEL:  CBC Recent Labs  Lab 04/13/18 0903  WBC 8.5  HGB 10.0*  HCT 33.7*  PLT 266    Chemistries  Recent Labs  Lab 04/13/18 0903  NA 137  K 4.6  CL 100  CO2 27  GLUCOSE 167*  BUN 39*  CREATININE 5.48*  CALCIUM 10.1  AST 18  ALT 15  ALKPHOS 147*  BILITOT 0.4   Cardiac Enzymes No results for input(s): TROPONINI in the last 168 hours. RADIOLOGY:  No results found. ASSESSMENT AND PLAN:  Chelise Hanger  is a 67 y.o. female with a known history of end-stage renal disease on hemodialysis, chronic anemia, bilateral below knee amputation due to severe peripheral vascular disease, carotid artery disease, hypertension comes to the emergency room from the [facility] of elements after patient fell while trying to slide on the board from her bed to the wheelchair. She started noticing some bleeding from the right amputation stump.  1. Bilateral below knee amputation secondary to severe peripheral arterial disease -patient sustained a fall this morning.  -s/p  bilateral BKA washout/revision/wound VAC per vascular  dr Lorenso Courier POD# 3 -PRN pain meds okay from vascular standpoint to be discharge. Dressing change and wound VAC changes per Dr. Lorenso Courier  2. Anemia of chronic disease -hemoglobin 10.0 -transfuse as needed  3. Hypertension continue home meds   4. Type II diabetes sliding  scale insulin  5. End-stage renal disease nephrology consultation placed with Dr. Zollie Scale. Patient will be resume back on her hemodialysis -patient will get dialysis today.  Social worker for discharge planning-- will discharge once bed is available.  CODE STATUS: dnr  DVT Prophylaxis: eliquis TOTAL TIME TAKING CARE OF THIS PATIENT: *25* minutes.  >50% time spent on counselling and coordination of care  Patient is medically best at baseline ready for discharge once rehab place acquired.  Note: This dictation was prepared with Dragon dictation along with smaller phrase technology. Any transcriptional errors that result from this process are unintentional.  Fritzi Mandes M.D on 04/16/2018 at 12:22 PM  Between 7am to 6pm - Pager - [phone]  After 6pm go to [email or web address] - password [email or web address] [facility]  Office  [phone]  CC: Primary care physician; Hanley Ben, NPPatient ID: Stephanie Ellis, female   DOB: 1950-10-21, 67 y.o.   MRN: [id number]

## 2018-04-16 NOTE — Progress Notes (Signed)
Post HD assessment    04/16/18 1611  Neurological  Level of Consciousness Alert  Orientation Level Oriented X4  Respiratory  Respiratory Pattern Regular;Unlabored  Chest Assessment Chest expansion symmetrical  Cardiac  ECG Monitor Yes  Cardiac Rhythm SB;NSR  Vascular  R Radial Pulse +2  L Radial Pulse +2  Edema Generalized;Facial  Integumentary  Integumentary (WDL) X  Skin Color Appropriate for ethnicity  Musculoskeletal  Musculoskeletal (WDL) X  Generalized Weakness Yes  Assistive Device None  GU Assessment  Genitourinary (WDL) X  Genitourinary Symptoms  (HD)  Psychosocial  Psychosocial (WDL) WDL

## 2018-04-16 NOTE — Progress Notes (Signed)
Pre HD assessment   04/16/18 1202  Vital Signs  Temp 97.6 F (36.4 C)  Temp Source Oral  Pulse Rate (!) 58  Pulse Rate Source Monitor  Resp (!) 21  BP (!) 134/103  BP Location Right Arm  BP Method Automatic  Patient Position (if appropriate) Sitting  Oxygen Therapy  SpO2 100 %  O2 Device Room Air  Pain Assessment  Pain Scale 0-10  Pain Score 0  Dialysis Weight  Weight 55 kg  Type of Weight Pre-Dialysis  Time-Out for Hemodialysis  What Procedure? HD  Pt Identifiers(min of two) First/Last Name;MRN/Account#  Correct Site? Yes  Correct Side? Yes  Correct Procedure? Yes  Consents Verified? Yes  Rad Studies Available? N/A  Safety Precautions Reviewed? Yes  Research scientist (physical sciences)  (2A)  Station Number 1  UF/Alarm Test Passed  Conductivity: Meter 13.8  Conductivity: Machine  13.7  pH 7.4  Reverse Osmosis main  Normal Saline Lot Number [id number]  Dialyzer Lot Number [id number]  Disposable Set Lot Number [id number]  Machine Temperature 98.6 F (37 C)  Musician and Audible Yes  Blood Lines Intact and Secured Yes  Pre Treatment Patient Checks  Vascular access used during treatment Catheter  Patient is receiving dialysis in a chair Yes  Hepatitis B Surface Antigen Results Negative  Date Hepatitis B Surface Antigen Drawn 11/26/17  Hepatitis B Surface Antibody  (>10)  Date Hepatitis B Surface Antibody Drawn 11/26/17  Hemodialysis Consent Verified Yes  Hemodialysis Standing Orders Initiated Yes  ECG (Telemetry) Monitor On Yes  Prime Ordered Normal Saline  Length of  DialysisTreatment -hour(s) 3.5 Hour(s)  Dialyzer Elisio 17H NR  Dialysate 2K, 2.5 Ca  Dialysis Anticoagulant None  Dialysate Flow Ordered 800  Blood Flow Rate Ordered 400 mL/min  Ultrafiltration Goal 1 Liters  Pre Treatment Labs Phosphorus  Dialysis Blood Pressure Support Ordered Normal Saline  Education / Care Plan  Dialysis Education Provided Yes  Documented Education in Care Plan Yes   Hemodialysis Catheter Left Subclavian Double-lumen  Placement Date: 02/11/18   Placed prior to admission: No  Orientation: Left  Access Location: Subclavian  Hemodialysis Catheter Type: Double-lumen  Site Condition No complications  Blue Lumen Status Heparin locked  Red Lumen Status Heparin locked  Purple Lumen Status N/A  Dressing Type Biopatch  Dressing Status Clean;Dry;Intact  Drainage Description None

## 2018-04-16 NOTE — Progress Notes (Signed)
Patient scheduled for HD this day, AM Hydralazine held.

## 2018-04-16 NOTE — Progress Notes (Signed)
3 Days Post-Op   Subjective/Chief Complaint: Doing OK. Without complaint. Notes phantom pain in LEFT stump. Denies pain in right stump.    Objective: Vital signs in last 24 hours: Temp:  [97.8 F (36.6 C)-98.2 F (36.8 C)] 98 F (36.7 C) (12/26 0542) Pulse Rate:  [58-69] 58 (12/26 0542) Resp:  [18-20] 18 (12/26 0542) BP: (164-191)/(60-86) 177/60 (12/26 0542) SpO2:  [98 %-100 %] 98 % (12/26 0542) Last BM Date: 04/12/18  Intake/Output from previous day: No intake/output data recorded. Intake/Output this shift: Total I/O In: 360 [P.O.:360] Out: -   General appearance: alert and no distress Cardio: regular rate and rhythm GI: soft, non-tender; bowel sounds normal; no masses,  no organomegaly Extremities: RIGHT: Incision C/D/I, soft, no erythema Incision/Wound:LEFT- VAC removed- base of wound clean, pink, no drainage- wet-dry dressing placed  Lab Results:  No results for input(s): WBC, HGB, HCT, PLT in the last 72 hours. BMET No results for input(s): NA, K, CL, CO2, GLUCOSE, BUN, CREATININE, CALCIUM in the last 72 hours. PT/INR No results for input(s): LABPROT, INR in the last 72 hours. ABG No results for input(s): PHART, HCO3 in the last 72 hours.  Invalid input(s): PCO2, PO2  Studies/Results: No results found.  Anti-infectives: Anti-infectives (From admission, onward)   Start     Dose/Rate Route Frequency Ordered Stop   04/13/18 1349  bacitracin 50,000 Units in sodium chloride 0.9 % 500 mL irrigation  Status:  Discontinued       As needed 04/13/18 1350 04/13/18 1350   04/13/18 1145  ceFAZolin (ANCEF) IVPB 2g/100 mL premix     2 g 200 mL/hr over 30 Minutes Intravenous  Once 04/13/18 1141 04/13/18 1210   04/13/18 1131  ceFAZolin (ANCEF) 2-4 GM/100ML-% IVPB    Note to Pharmacy:  Norton Blizzard  : cabinet override      04/13/18 1131 04/13/18 1210      Assessment/Plan: s/p Procedure(s): REVISION OF BILATERAL AMPUTATIONS BELOW KNEE WITH WOUND VAC APPLICATIONS  (Bilateral) APPLICATION OF WOUND VAC (Left)   OK for discharge when bed available  Gram + in wound culture- final pending. Remains afebrile, no WBC and stump is clean- hold off on ABX until final culture results. Daily dressing changes to right; replace VAC to LEFT at facility.    LOS: 3 days    Evaristo Bury 04/16/2018

## 2018-04-16 NOTE — Clinical Social Work Placement (Signed)
   CLINICAL SOCIAL WORK PLACEMENT  NOTE  Date:  04/16/2018  Patient Details  Name: Stephanie Ellis MRN: [id number] Date of Birth: [birth year]  Clinical Social Work is seeking post-discharge placement for this patient at the [place] level of care (*CSW will initial, date and re-position this form in  chart as items are completed):  Yes   Patient/family provided with [place] Work Department's list of facilities offering this level of care within the geographic area requested by the patient (or if unable, by the patient's family).  Yes   Patient/family informed of their freedom to choose among providers that offer the needed level of care, that participate in Medicare, Medicaid or managed care program needed by the patient, have an available bed and are willing to accept the patient.  Yes   Patient/family informed of [facility] ownership interest in [facility] and [facility], as well as of the fact that they are under no obligation to receive care at these facilities.  PASRR submitted to EDS on       PASRR number received on       Existing PASRR number confirmed on 04/14/18     FL2 transmitted to all facilities in geographic area requested by pt/family on 04/14/18     FL2 transmitted to all facilities within larger geographic area on       Patient informed that his/her managed care company has contracts with or will negotiate with certain facilities, including the following:        Yes   Patient/family informed of bed offers received.  Patient chooses bed at [facility])     Physician recommends and patient chooses bed at [facility])    Patient to be transferred to [facility]) on 04/16/18.  Patient to be transferred to facility by (EMS)     Patient family notified on 04/16/18 of transfer.  Name of family member notified:  Stephanie Ellis     PHYSICIAN       Additional Comment:     _______________________________________________ Shela Leff, LCSW 04/16/2018, 1:34 PM

## 2018-04-16 NOTE — Progress Notes (Signed)
HD tx start    04/16/18 1230  Vital Signs  Pulse Rate (!) 57  Pulse Rate Source Monitor  Resp (!) 22  BP (!) 205/68  BP Location Right Arm  BP Method Automatic  Patient Position (if appropriate) Lying  Oxygen Therapy  SpO2 100 %  O2 Device Room Air  During Hemodialysis Assessment  Blood Flow Rate (mL/min) 400 mL/min  Arterial Pressure (mmHg) -210 mmHg  Venous Pressure (mmHg) 150 mmHg  Transmembrane Pressure (mmHg) 60 mmHg  Ultrafiltration Rate (mL/min) 430 mL/min  Dialysate Flow Rate (mL/min) 800 ml/min  Conductivity: Machine  13.7  HD Safety Checks Performed Yes  Dialysis Fluid Bolus Normal Saline  Bolus Amount (mL) 250 mL  Intra-Hemodialysis Comments Tx initiated  Hemodialysis Catheter Left Subclavian Double-lumen  Placement Date: 02/11/18   Placed prior to admission: No  Orientation: Left  Access Location: Subclavian  Hemodialysis Catheter Type: Double-lumen  Blue Lumen Status Infusing  Red Lumen Status Infusing

## 2018-04-20 LAB — AEROBIC/ANAEROBIC CULTURE W GRAM STAIN (SURGICAL/DEEP WOUND): Culture: NORMAL

## 2018-04-23 ENCOUNTER — Other Ambulatory Visit: Payer: Self-pay

## 2018-04-23 ENCOUNTER — Emergency Department
Admission: EM | Admit: 2018-04-23 | Discharge: 2018-04-23 | Disposition: A | Discharge status: Home / Self Care | Payer: Medicare Other | Attending: Emergency Medicine | Admitting: Emergency Medicine

## 2018-04-23 ENCOUNTER — Emergency Department: Payer: Medicare Other

## 2018-04-23 DIAGNOSIS — N186 End stage renal disease: Secondary | ICD-10-CM | POA: Diagnosis not present

## 2018-04-23 DIAGNOSIS — Z89512 Acquired absence of left leg below knee: Secondary | ICD-10-CM | POA: Diagnosis not present

## 2018-04-23 DIAGNOSIS — I5032 Chronic diastolic (congestive) heart failure: Secondary | ICD-10-CM | POA: Diagnosis not present

## 2018-04-23 DIAGNOSIS — I132 Hypertensive heart and chronic kidney disease with heart failure and with stage 5 chronic kidney disease, or end stage renal disease: Secondary | ICD-10-CM | POA: Diagnosis not present

## 2018-04-23 DIAGNOSIS — I25721 Atherosclerosis of autologous artery coronary artery bypass graft(s) with angina pectoris with documented spasm: Secondary | ICD-10-CM | POA: Insufficient documentation

## 2018-04-23 DIAGNOSIS — Z992 Dependence on renal dialysis: Secondary | ICD-10-CM | POA: Insufficient documentation

## 2018-04-23 DIAGNOSIS — E1122 Type 2 diabetes mellitus with diabetic chronic kidney disease: Secondary | ICD-10-CM | POA: Insufficient documentation

## 2018-04-23 DIAGNOSIS — R4182 Altered mental status, unspecified: Secondary | ICD-10-CM | POA: Insufficient documentation

## 2018-04-23 DIAGNOSIS — I252 Old myocardial infarction: Secondary | ICD-10-CM | POA: Insufficient documentation

## 2018-04-23 DIAGNOSIS — Z89511 Acquired absence of right leg below knee: Secondary | ICD-10-CM | POA: Diagnosis not present

## 2018-04-23 LAB — BASIC METABOLIC PANEL
Anion gap: 10 (ref 5–15)
BUN: 15 mg/dL (ref 8–23)
CO2: 28 mmol/L (ref 22–32)
Calcium: 9 mg/dL (ref 8.9–10.3)
Chloride: 98 mmol/L (ref 98–111)
Creatinine, Ser: 2.76 mg/dL — ABNORMAL HIGH (ref 0.44–1.00)
GFR calc Af Amer: 20 mL/min — ABNORMAL LOW (ref >60)
GFR calc non Af Amer: 17 mL/min — ABNORMAL LOW (ref >60)
Glucose, Bld: 80 mg/dL (ref 70–99)
Potassium: 3.4 mmol/L — ABNORMAL LOW (ref 3.5–5.1)
Sodium: 136 mmol/L (ref 135–145)

## 2018-04-23 LAB — CBC WITH DIFFERENTIAL/PLATELET
Abs Immature Granulocytes: 0.04 10*3/uL (ref 0.00–0.07)
Basophils Absolute: 0 10*3/uL (ref 0.0–0.1)
Basophils Relative: 0 %
Eosinophils Absolute: 0.1 10*3/uL (ref 0.0–0.5)
Eosinophils Relative: 1 %
HCT: 34.4 % — ABNORMAL LOW (ref 36.0–46.0)
Hemoglobin: 10.7 g/dL — ABNORMAL LOW (ref 12.0–15.0)
Immature Granulocytes: 0 %
Lymphocytes Relative: 16 %
Lymphs Abs: 1.7 10*3/uL (ref 0.7–4.0)
MCH: 29.4 pg (ref 26.0–34.0)
MCHC: 31.1 g/dL (ref 30.0–36.0)
MCV: 94.5 fL (ref 80.0–100.0)
Monocytes Absolute: 0.9 10*3/uL (ref 0.1–1.0)
Monocytes Relative: 8 %
Neutro Abs: 7.6 10*3/uL (ref 1.7–7.7)
Neutrophils Relative %: 75 %
Platelets: 208 10*3/uL (ref 150–400)
RBC: 3.64 MIL/uL — ABNORMAL LOW (ref 3.87–5.11)
RDW: 18.8 % — ABNORMAL HIGH (ref 11.5–15.5)
WBC: 10.3 10*3/uL (ref 4.0–10.5)
nRBC: 0 % (ref 0.0–0.2)

## 2018-04-23 LAB — HEPATIC FUNCTION PANEL
ALT: 7 U/L (ref 0–44)
AST: 21 U/L (ref 15–41)
Albumin: 3.2 g/dL — ABNORMAL LOW (ref 3.5–5.0)
Alkaline Phosphatase: 107 U/L (ref 38–126)
Bilirubin, Direct: <0.1 mg/dL (ref 0.0–0.2)
Total Bilirubin: 0.6 mg/dL (ref 0.3–1.2)
Total Protein: 7.3 g/dL (ref 6.5–8.1)

## 2018-04-23 MED ORDER — AMMONIA AROMATIC IN INHA
RESPIRATORY_TRACT | Status: AC
  Filled 2018-04-23: qty 10

## 2018-04-23 MED ORDER — CLONIDINE HCL 0.1 MG PO TABS
0.2000 mg | ORAL_TABLET | Freq: Once | ORAL | Status: AC
  Administered 2018-04-23: 0.2 mg via ORAL
  Filled 2018-04-23: qty 2

## 2018-04-23 NOTE — ED Notes (Signed)
Patient complaining of left side pain. Patient's daughter in room. Daughter states patient had a fall Monday and fell on left side.

## 2018-04-23 NOTE — ED Notes (Signed)
Spoke with sister who is Publishing copy for wound vac.

## 2018-04-23 NOTE — Discharge Instructions (Signed)
Return to the ER for new, worsening, recurrent confusion or change in mental status, weakness, fever, shortness of breath, vomiting, or any other new or worsening symptoms that concern you.

## 2018-04-23 NOTE — Anesthesia Postprocedure Evaluation (Signed)
Anesthesia Post Note  Patient: Stephanie Ellis  Procedure(s) Performed: REVISION OF BILATERAL AMPUTATIONS BELOW KNEE WITH WOUND VAC APPLICATIONS (Bilateral ) APPLICATION OF WOUND VAC (Left )  Patient location during evaluation: PACU Anesthesia Type: General Level of consciousness: awake and alert Pain management: pain level controlled Vital Signs Assessment: post-procedure vital signs reviewed and stable Respiratory status: spontaneous breathing, nonlabored ventilation, respiratory function stable and patient connected to nasal cannula oxygen Cardiovascular status: blood pressure returned to baseline and stable Postop Assessment: no apparent nausea or vomiting Anesthetic complications: no     Last Vitals:  Vitals:   04/16/18 1614 04/16/18 1657  BP:  (!) 154/91  Pulse: 65 67  Resp: (!) 24 (!) 21  Temp:  36.5 C  SpO2: 100% 97%    Last Pain:  Vitals:   04/16/18 1657  TempSrc: Oral  PainSc:                  Molli Barrows

## 2018-04-23 NOTE — ED Triage Notes (Signed)
Patient to ED by [facility] from dialysis. Per EMS nurses at [facility] states patient has slightly AMS before dialysis and then afterwards was more confused. Patient is resident at [facility]. Dialysis states they took off 0.8 liters of fluid. Patient has bi lat amputee legs below knees with wound vac on left leg. Patient was able to states date of birth, where she had been today and what days she goes to dialysis, and where she lives.

## 2018-04-23 NOTE — ED Provider Notes (Signed)
[facility] Emergency Department Provider Note ____________________________________________   First MD Initiated Contact with Patient 04/23/18 1715     (approximate)  I have reviewed the triage vital signs and the nursing notes.   HISTORY  Chief Complaint Altered Mental Status    HPI Stephanie Ellis is a 68 y.o. female with PMH as noted below including ESRD on dialysis who presents with altered mental status, acute onset today, and characterized by confusion.  The patient went to dialysis and then was noted to be confused when she returned to her facility.  At this time the patient denies any complaints.  She is able to answer all my questions and denies any acute pain, states she does not feel confused or weak, and has no other acute symptoms.  Past Medical History:  Diagnosis Date  . (HFpEF) heart failure with preserved ejection fraction ([place])    a. 01/2018 Echo: EF 50-55%, no rwma, Gr1 DD, triv AI, mild MR. Midly dil LA/RV, mod reduced RV fxn. Irreg thickening of TV w/ mobile echodensity that appears to arise from valve.  . Anemia   . Anorexia   . Bacteremia due to Pseudomonas   . Carotid arterial disease ([place])    a. 01/2017 Carotid U/S: RICA <67, LICA <61.  Marland Kitchen Complication of anesthesia    a. Pt reports h/o complication on 9 different occasions - ? hypotension/arrest.  . 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 01/2015 [facility]): 70% LM, p-mLAD 50-60% (Resting FFR 0.75), mRCA 80-90%, ~40 Ost OM & D1-->CABG; b. 01/2016 Staged PCI of LCX x 2 and RCA.  Marland Kitchen ESRD (end stage renal disease) on dialysis ([place])    a. ESRD secondary to acute kidney failure s/p CABG-->PD  . Essential hypertension   . GERD (gastroesophageal reflux disease)   . Heart murmur   . Hypercholesterolemia   . Myocardial infarction ([place]) 2016  . PVD (peripheral vascular disease) ([place])    a. 01/30/2018 PV Angio: Sev Left Popliteal dzs s/p PTA w/ 34m lutonix  DEB w/ mech aspiration of the L popliteal, L AT, and Left tibioperoneal trunck and peroneal artery.  . 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  [facility]: Dr. BMarland KitchenHaithcock: CABG x 3, LIMA to LAD, SVG to RCA, SVG to OM3, EVH  . Sinusitis 2019  . Type II diabetes mellitus with complication ([facility]    CAD    Patient Active Problem List   Diagnosis Date Noted  . BKA stump complication ([place] 195/12/3265 . Unilateral complete BKA, right, initial encounter ([place] 03/30/2018  . Transient loss of consciousness 03/19/2018  . Transient alteration of awareness   . VT (ventricular tachycardia) ([place]   . Disorientation   . Hypertensive urgency 03/08/2018  . Ischemia of extremity 02/18/2018  . ESRD (end stage renal disease) ([place]   . Diabetic osteomyelitis ([place]   . Peripheral vascular disease ([place]   . Advanced care planning/counseling discussion   . Palliative care by specialist   . Goals of care, counseling/discussion   . Diabetes mellitus, type 2 ([place] 01/30/2018  . Sepsis ([place] 01/29/2018  . Peripheral neuropathy 06/17/2017  . Encephalopathy 02/06/2017  . Hypertensive emergency 02/06/2017  . Endophthalmitis, left eye 12/05/2016  . Vomiting 10/24/2016  . Hematuria 10/17/2016  . Acute lower UTI 10/17/2016  . Anesthesia complication 012/45/8099 . Chest pain with moderate risk for cardiac etiology 09/04/2016  . Steal syndrome dialysis vascular access, initial encounter ([place] 06/18/2016  .  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 Coronary artery disease involving coronary bypass graft of native heart with angina pectoris ([place]) 02/10/2016  . Hypertension 02/02/2016  . Hyperlipidemia 02/02/2016  . Coronary artery disease involving left main coronary artery 10/04/2015  . Abdominal pain of unknown etiology 10/04/2015  . ESRD (end stage renal disease) on dialysis ([place])   . Type II diabetes mellitus with complication ([place])   . NSTEMI (non-ST elevated myocardial infarction) ([place])   .  Right sided abdominal pain   . Elevated troponin 10/03/2015  . 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 Chronic diastolic congestive heart failure ([place]) 04/19/2014  . Stage 5 chronic kidney disease on chronic dialysis ([place]) 04/19/2014  . Anemia 09/20/2013  . Routine health maintenance 01/29/2013    Past Surgical History:  Procedure Laterality Date  . ABDOMINAL HYSTERECTOMY    . AMPUTATION Right 02/25/2018   Procedure: AMPUTATION BELOW KNEE;  Surgeon: Katha Cabal, MD;  Location: [facility] ORS;  Service: Vascular;  Laterality: Right;  . AMPUTATION Left 03/11/2018   Procedure: AMPUTATION BELOW KNEE;  Surgeon: Algernon Huxley, MD;  Location: [facility] OR[facility];  Service: Vascular;  Laterality: Left;  . AMPUTATION Bilateral 04/13/2018   Procedure: REVISION OF BILATERAL AMPUTATIONS BELOW KNEE WITH WOUND VAC APPLICATIONS;  Surgeon: Evaristo Bury, MD;  Location: [facility];  Service: Vascular;  Laterality: Bilateral;  . AMPUTATION TOE Left 02/06/2018   Procedure: AMPUTATION GREAT TOE;  Surgeon: Samara Deist, DPM;  Location: [facility] OR[facility];  Service: Podiatry;  Laterality: Left;  . APPLICATION OF WOUND VAC Left 04/13/2018   Procedure: APPLICATION OF WOUND VAC;  Surgeon: Evaristo Bury, MD;  Location: [facility];  Service: Vascular;  Laterality: Left;  . ARTERIOVENOUS GRAFT PLACEMENT  05/2016  . AV FISTULA PLACEMENT Left 05/30/2016   Procedure: ARTERIOVENOUS graft;  Surgeon: Algernon Huxley, MD;  Location: [facility];  Service: Vascular;  Laterality: Left;  . CAPD INSERTION N/A 10/30/2017   Procedure: LAPAROSCOPIC INSERTION CONTINUOUS AMBULATORY PERITONEAL DIALYSIS  (CAPD) CATHETER;  Surgeon: Algernon Huxley, MD;  Location: [facility];  Service: Vascular;  Laterality: N/A;  . CARDIAC CATHETERIZATION  01/2015   [facility]: Ost LM 70%, p-m LAD 50-60% (Rest FFR + @ 0.75), mRCA 80-90%, ostD1 40%, pOM1 40%  . CATARACT EXTRACTION W/PHACO Left 01/18/2016   Procedure: CATARACT EXTRACTION PHACO AND INTRAOCULAR LENS PLACEMENT (IOC);  Surgeon: Eulogio Bear, MD;  Location: [facility];  Service: Ophthalmology;  Laterality: Left;  [place] 1.05 AP% 15.5 CDE 10.16 Fluid Pack Lot # [id number]  . CATARACT EXTRACTION W/PHACO Right 08/01/2016   Procedure: CATARACT EXTRACTION PHACO AND INTRAOCULAR LENS PLACEMENT (IOC);  Surgeon: Eulogio Bear, MD;  Location: [facility];  Service: Ophthalmology;  Laterality: Right;  [place] 01:00.6 AP% 11.4 CDE 6.93  LOT # [id number]  . COLONOSCOPY    . CORONARY ANGIOPLASTY     SENTS 02/12/16  . CORONARY ARTERY BYPASS GRAFT  03/28/15    [facility]: Dr. Waldemar Dickens: LIMA to LAD, SVG to RCA, SVG to OM3, EVH  . DIALYSIS/PERMA CATHETER INSERTION N/A 02/11/2018   Procedure: DIALYSIS/PERMA CATHETER INSERTION;  Surgeon: Algernon Huxley, MD;  Location: [place] CV LAB;  Service: Cardiovascular;  Laterality: N/A;  . DIALYSIS/PERMA CATHETER REMOVAL Right 02/04/2018   Procedure: DIALYSIS/PERMA CATHETER REMOVAL;  Surgeon: Katha Cabal, MD;  Location: [place] CV LAB;  Service: Cardiovascular;  Laterality: Right;  . ESOPHAGOGASTRODUODENOSCOPY (EGD) WITH PROPOFOL N/A 10/24/2016   Procedure: ESOPHAGOGASTRODUODENOSCOPY (EGD) WITH PROPOFOL;  Surgeon: [name], MD;  Location: [facility];  Service: Endoscopy;  Laterality: N/A;  . EYE SURGERY Bilateral    cataract surgery  . EYE SURGERY     drains for glaucoma  . INSERTION EXPRESS TUBE SHUNT Right 08/01/2016   Procedure: INSERTION EXPRESS TUBE SHUNT;  Surgeon: Eulogio Bear, MD;  Location: [facility];  Service: Ophthalmology;  Laterality: Right;  . INSERTION OF AHMED VALVE Left 08/15/2016   Procedure: INSERTION OF AHMED VALVE;  Surgeon: Eulogio Bear, MD;  Location: [facility];  Service: Ophthalmology;  Laterality: Left;  . INSERTION OF DIALYSIS CATHETER    . LOWER EXTREMITY ANGIOGRAPHY Right 02/19/2018   Procedure: Lower Extremity Angiography;  Surgeon: Algernon Huxley, MD;  Location: [place] CV LAB;  Service: Cardiovascular;  Laterality: Right;  . LOWER EXTREMITY ANGIOGRAPHY  Left 01/30/2018   Procedure: Lower Extremity Angiography;  Surgeon: Katha Cabal, MD;  Location: [place] CV LAB;  Service: Cardiovascular;  Laterality: Left;  . REMOVAL OF A DIALYSIS CATHETER N/A 02/25/2018   Procedure: REMOVAL OF A DIALYSIS CATHETER;  Surgeon: Katha Cabal, MD;  Location: [facility];  Service: Vascular;  Laterality: N/A;  . TEMPORARY DIALYSIS CATHETER N/A 02/06/2018   Procedure: TEMPORARY DIALYSIS CATHETER;  Surgeon: Katha Cabal, MD;  Location: [place] [facility] LAB;  Service: Cardiovascular;  Laterality: N/A;  . TRANSTHORACIC ECHOCARDIOGRAM  January 2017    EF 60-65%. GR 2 DD. Mild degenerative mitral valve disease but no prolapse or regurgitation. Mild left atrial dilation. Mild to moderate LVH. Pericardial effusion gone  . UPPER EXTREMITY ANGIOGRAPHY Left 06/27/2016   Procedure: Upper Extremity Angiography;  Surgeon: Algernon Huxley, MD;  Location: [place] [facility];  Service: Cardiovascular;  Laterality: Left;  . UPPER EXTREMITY ANGIOGRAPHY Left 08/22/2016   Procedure: Upper Extremity Angiography;  Surgeon: Algernon Huxley, MD;  Location: [place] [facility] LAB;  Service: Cardiovascular;  Laterality: Left;  Marland Kitchen VASCULAR SURGERY      Prior to Admission medications   Medication Sig Start Date End Date Taking? Authorizing Provider  acetaminophen (TYLENOL) 325 MG tablet Take 2 tablets (650 mg total) by mouth every 6 (six) hours as needed for mild pain (or Fever >/= 101). 03/23/18   [name], MD  apixaban (ELIQUIS) 2.5 MG TABS tablet Take 1 tablet (2.5 mg total) by mouth 2 (two) times daily. 02/15/18   Vaughan Basta, MD  aspirin EC 81 MG tablet Take 1 tablet (81 mg total) by mouth daily. 10/19/16   Dustin Flock, MD  atorvastatin (LIPITOR) 80 MG tablet Take 80 mg by mouth at bedtime.  05/05/17   [provider]  brimonidine (ALPHAGAN) 0.2 % ophthalmic solution Place 1 drop into both eyes 3 (three) times daily. 03/18/18   Bettey Costa, MD  calcitRIOL  (ROCALTROL) 0.5 MCG capsule Take 0.5 mcg by mouth daily.    [provider]  carvedilol (COREG) 6.25 MG tablet Take 1 tablet (6.25 mg total) by mouth 2 (two) times daily. 03/18/18   Bettey Costa, MD  docusate sodium (COLACE) 100 MG capsule Take 1 capsule (100 mg total) by mouth 2 (two) times daily. 02/15/18   Vaughan Basta, MD  doxycycline (VIBRAMYCIN) 100 MG capsule Take 1 capsule (100 mg total) by mouth 2 (two) times daily. 03/30/18   Kris Hartmann, NP  epoetin alfa (EPOGEN,PROCRIT) 42706 UNIT/ML injection Inject 1 mL (10,000 Units total) into the vein Every Tuesday,Thursday,and Saturday with dialysis. 03/24/18   [name], MD  famotidine (PEPCID) 20 MG tablet Take 20 mg  by mouth 2 (two) times daily.    [provider]  folic acid (FOLVITE) 1 MG tablet Take 1 tablet (1 mg total) by mouth daily. 02/15/18 02/15/19  Vaughan Basta, MD  isosorbide mononitrate (IMDUR) 30 MG 24 hr tablet Take 30 mg by mouth daily.     [provider]  levETIRAcetam (KEPPRA) 250 MG tablet Take 1 tablet (250 mg total) by mouth 3 (three) times a week. 03/25/18   [name], MD  levETIRAcetam (KEPPRA) 500 MG tablet Take 1 tablet (500 mg total) by mouth 2 (two) times daily. 03/23/18   Gouru, Illene Silver, MD  midodrine (PROAMATINE) 10 MG tablet Take 1 tablet (10 mg total) by mouth 3 (three) times a week. Patient taking differently: Take 10 mg by mouth 3 (three) times a week. Take Tuesday, Thursday, and Saturday 02/17/18   Vaughan Basta, MD  mirtazapine (REMERON) 7.5 MG tablet Take by mouth. 12/18/17   [provider]  Multiple Vitamin (MULTIVITAMIN WITH MINERALS) TABS tablet Take 2 tablets by mouth daily. Gummy vitamins    [provider]  Multiple Vitamins-Minerals (PRORENAL + D) TABS Take 1 tablet by mouth daily.    [provider]  nitroGLYCERIN (NITROSTAT) 0.4 MG SL tablet Place 1 tablet (0.4 mg total) under the tongue every 5 (five) minutes as  needed for chest pain. 09/05/16   Loletha Grayer, MD  Omega-3 1000 MG CAPS Take 2 capsules by mouth daily.     [provider]  ondansetron (ZOFRAN) 4 MG tablet Take 4 mg by mouth every 8 (eight) hours as needed for nausea or vomiting.    [provider]  Polyethyl Glycol-Propyl Glycol 0.4-0.3 % SOLN Place 1 drop into both eyes 2 (two) times daily.    [provider]  rOPINIRole (REQUIP) 0.25 MG tablet Take by mouth. 12/02/16   [provider]  sevelamer carbonate (RENVELA) 800 MG tablet Take 800 mg by mouth 3 (three) times daily with meals.    [provider]  timolol (TIMOPTIC) 0.5 % ophthalmic solution Place 1 drop into both eyes 3 (three) times daily. 03/18/18   Bettey Costa, MD  traMADol (ULTRAM) 50 MG tablet Take 1 tablet (50 mg total) by mouth every 8 (eight) hours as needed for moderate pain or severe pain. 03/23/18   Gouru, Illene Silver, MD  valsartan (DIOVAN) 80 MG tablet Take by mouth. 12/03/17 12/03/18  [provider]  vitamin B-12 (CYANOCOBALAMIN) 1000 MCG tablet Take 1 tablet (1,000 mcg total) by mouth daily. 02/15/18   Vaughan Basta, MD    Allergies Chlorthalidone; Fentanyl; Midazolam; Ace inhibitors; Angiotensin receptor blockers; Norvasc [amlodipine]; and Phenergan [promethazine hcl]  Family History  Problem Relation Age of Onset  . Diabetes Mellitus II Mother   . Heart failure Mother   . Pancreatic cancer Father     Social History Social History   Tobacco Use  . Smoking status: Never Smoker  . Smokeless tobacco: Never Used  Substance Use Topics  . Alcohol use: No  . Drug use: No    Review of Systems  Constitutional: No fever. Eyes: No redness. ENT: No neck pain. Cardiovascular: Denies chest pain. Respiratory: Denies shortness of breath. Gastrointestinal: No nausea or vomiting.  Genitourinary: Negative for dysuria.  Musculoskeletal: Negative for back pain. Skin: Negative for rash. Neurological: Negative  for headache.   ____________________________________________   PHYSICAL EXAM:  VITAL SIGNS: ED Triage Vitals  Enc Vitals Group     BP 04/23/18 1719 (!) 208/99     Pulse  Rate 04/23/18 1719 78     Resp 04/23/18 1719 13     Temp 04/23/18 1719 98 F (36.7 C)     Temp Source 04/23/18 1719 Oral     SpO2 04/23/18 1713 98 %     Weight 04/23/18 1723 119 lb 7.8 oz (54.2 kg)     Height 04/23/18 1723 5' (1.524 m)     Head Circumference --      Peak Flow --      Pain Score --      Pain Loc --      Pain Edu? --      Excl. in [place]? --     Constitutional: Alert and oriented.  Somewhat tired and weak appearing but in no acute distress. Eyes: Conjunctivae are normal.  EOMI.  Pupils postsurgical. Head: Atraumatic. Nose: No congestion/rhinnorhea. Mouth/Throat: Mucous membranes are slightly dry.   Neck: Normal range of motion.  Cardiovascular: Normal rate, regular rhythm. Grossly normal heart sounds.  Good peripheral circulation. Respiratory: Normal respiratory effort.  No retractions. Lungs CTAB. Gastrointestinal: Soft and nontender. No distention.  Genitourinary: No flank tenderness. Musculoskeletal: No lower extremity edema.  Extremities warm and well perfused.  Bilateral below-knee amputations with wounds clean dry and intact with no dehiscence.  No erythema, induration, or swelling. Neurologic:  Normal speech and language.  Motor intact in all extremities.  No gross focal neurologic deficits are appreciated.  Skin:  Skin is warm and dry. No rash noted. Psychiatric: Speech and behavior are normal.  ____________________________________________   LABS (all labs ordered are listed, but only abnormal results are displayed)  Labs Reviewed  CBC WITH DIFFERENTIAL/PLATELET - Abnormal; Notable for the following components:      Result Value   RBC 3.64 (*)    Hemoglobin 10.7 (*)    HCT 34.4 (*)    RDW 18.8 (*)    All other components within normal limits  BASIC METABOLIC PANEL - Abnormal;  Notable for the following components:   Potassium 3.4 (*)    Creatinine, Ser 2.76 (*)    GFR calc non Af Amer 17 (*)    GFR calc Af Amer 20 (*)    All other components within normal limits  HEPATIC FUNCTION PANEL - Abnormal; Notable for the following components:   Albumin 3.2 (*)    All other components within normal limits   ____________________________________________  EKG  ED ECG REPORT I, Arta Silence, the attending physician, personally viewed and interpreted this ECG.  Date: 04/23/2018 EKG Time: 1714 Rate: 78 Rhythm: normal sinus rhythm QRS Axis: normal Intervals: IVCD, borderline prolonged QT ST/T Wave abnormalities: LVH with repolarization abnormality Narrative Interpretation: no evidence of acute ischemia; no significant change when compared to EKG of 03/13/2018  ____________________________________________  RADIOLOGY  CXR: No focal infiltrate or edema  ____________________________________________   PROCEDURES  Procedure(s) performed: No  Procedures  Critical Care performed: No ____________________________________________   INITIAL IMPRESSION / ASSESSMENT AND PLAN / ED COURSE  Pertinent labs & imaging results that were available during my care of the patient were reviewed by me and considered in my medical decision making (see chart for details).  68 year old female with history of ESRD on dialysis presents with apparent confusion or altered mental status after dialysis today.  The patient herself states that she feels okay and denies any acute symptoms.  I reviewed the past medical records in Epic; the patient was most recently seen in the ED and admitted with wound dehiscence earlier this month.  On  exam currently the patient is significantly hypertensive but her other vital signs are normal.  She is alert and oriented.  The remainder of the exam is unremarkable.  The left leg wound is in a VAC.  The right wound is exposed and appears clean, dry,  and intact.  Overall I suspect most likely metabolic etiologies such as electrolyte abnormality.  Differential also includes infection or much less likely hypertensive encephalopathy.  We will obtain lab work-up and chest x-ray, give an antihypertensive, and reassess.  ----------------------------------------- 8:19 PM on 04/23/2018 -----------------------------------------  The patient's blood pressure is now 170s/90s.  She continues to remain comfortable and asymptomatic and is at her baseline mental status per family.  The lab work-up is unremarkable and consistent with the patient's baseline.  She is stable for discharge home at this time.  Return precautions given, and the patient and family expressed understanding. ____________________________________________   FINAL CLINICAL IMPRESSION(S) / ED DIAGNOSES  Final diagnoses:  Altered mental status, unspecified altered mental status type      NEW MEDICATIONS STARTED DURING THIS VISIT:  New Prescriptions   No medications on file     Note:  This document was prepared using Dragon voice recognition software and may include unintentional dictation errors.    Arta Silence, MD 04/23/18 2020

## 2018-05-02 ENCOUNTER — Inpatient Hospital Stay
Admission: EM | Admit: 2018-05-02 | Discharge: 2018-05-12 | DRG: 500 | Disposition: A | Discharge status: Skilled Nursing Facility | Payer: Medicare Other | Attending: Internal Medicine | Admitting: Internal Medicine

## 2018-05-02 ENCOUNTER — Other Ambulatory Visit: Payer: Self-pay

## 2018-05-02 ENCOUNTER — Encounter: Payer: Self-pay | Admitting: Emergency Medicine

## 2018-05-02 ENCOUNTER — Emergency Department: Payer: Medicare Other

## 2018-05-02 DIAGNOSIS — N2581 Secondary hyperparathyroidism of renal origin: Secondary | ICD-10-CM | POA: Diagnosis present

## 2018-05-02 DIAGNOSIS — I9589 Other hypotension: Secondary | ICD-10-CM | POA: Diagnosis present

## 2018-05-02 DIAGNOSIS — N186 End stage renal disease: Secondary | ICD-10-CM | POA: Diagnosis present

## 2018-05-02 DIAGNOSIS — Z90710 Acquired absence of both cervix and uterus: Secondary | ICD-10-CM | POA: Diagnosis not present

## 2018-05-02 DIAGNOSIS — T8744 Infection of amputation stump, left lower extremity: Secondary | ICD-10-CM | POA: Diagnosis present

## 2018-05-02 DIAGNOSIS — Y835 Amputation of limb(s) as the cause of abnormal reaction of the patient, or of later complication, without mention of misadventure at the time of the procedure: Secondary | ICD-10-CM | POA: Diagnosis present

## 2018-05-02 DIAGNOSIS — Z7901 Long term (current) use of anticoagulants: Secondary | ICD-10-CM

## 2018-05-02 DIAGNOSIS — L089 Local infection of the skin and subcutaneous tissue, unspecified: Secondary | ICD-10-CM

## 2018-05-02 DIAGNOSIS — Z8249 Family history of ischemic heart disease and other diseases of the circulatory system: Secondary | ICD-10-CM

## 2018-05-02 DIAGNOSIS — Z951 Presence of aortocoronary bypass graft: Secondary | ICD-10-CM

## 2018-05-02 DIAGNOSIS — Z885 Allergy status to narcotic agent status: Secondary | ICD-10-CM | POA: Diagnosis not present

## 2018-05-02 DIAGNOSIS — F329 Major depressive disorder, single episode, unspecified: Secondary | ICD-10-CM | POA: Diagnosis present

## 2018-05-02 DIAGNOSIS — E7800 Pure hypercholesterolemia, unspecified: Secondary | ICD-10-CM | POA: Diagnosis present

## 2018-05-02 DIAGNOSIS — E1122 Type 2 diabetes mellitus with diabetic chronic kidney disease: Secondary | ICD-10-CM | POA: Diagnosis present

## 2018-05-02 DIAGNOSIS — I5032 Chronic diastolic (congestive) heart failure: Secondary | ICD-10-CM | POA: Diagnosis present

## 2018-05-02 DIAGNOSIS — I2510 Atherosclerotic heart disease of native coronary artery without angina pectoris: Secondary | ICD-10-CM | POA: Diagnosis present

## 2018-05-02 DIAGNOSIS — G2581 Restless legs syndrome: Secondary | ICD-10-CM | POA: Diagnosis present

## 2018-05-02 DIAGNOSIS — Z1624 Resistance to multiple antibiotics: Secondary | ICD-10-CM | POA: Diagnosis not present

## 2018-05-02 DIAGNOSIS — Z794 Long term (current) use of insulin: Secondary | ICD-10-CM

## 2018-05-02 DIAGNOSIS — W19XXXA Unspecified fall, initial encounter: Secondary | ICD-10-CM | POA: Diagnosis present

## 2018-05-02 DIAGNOSIS — T8781 Dehiscence of amputation stump: Secondary | ICD-10-CM | POA: Diagnosis not present

## 2018-05-02 DIAGNOSIS — I252 Old myocardial infarction: Secondary | ICD-10-CM

## 2018-05-02 DIAGNOSIS — Z79891 Long term (current) use of opiate analgesic: Secondary | ICD-10-CM

## 2018-05-02 DIAGNOSIS — Z89512 Acquired absence of left leg below knee: Secondary | ICD-10-CM | POA: Diagnosis not present

## 2018-05-02 DIAGNOSIS — E876 Hypokalemia: Secondary | ICD-10-CM | POA: Diagnosis present

## 2018-05-02 DIAGNOSIS — Z1639 Resistance to other specified antimicrobial drug: Secondary | ICD-10-CM | POA: Diagnosis present

## 2018-05-02 DIAGNOSIS — Z8679 Personal history of other diseases of the circulatory system: Secondary | ICD-10-CM

## 2018-05-02 DIAGNOSIS — Z800 Family history of malignant neoplasm of digestive organs: Secondary | ICD-10-CM

## 2018-05-02 DIAGNOSIS — H5462 Unqualified visual loss, left eye, normal vision right eye: Secondary | ICD-10-CM | POA: Diagnosis not present

## 2018-05-02 DIAGNOSIS — E1151 Type 2 diabetes mellitus with diabetic peripheral angiopathy without gangrene: Secondary | ICD-10-CM | POA: Diagnosis present

## 2018-05-02 DIAGNOSIS — Z89511 Acquired absence of right leg below knee: Secondary | ICD-10-CM

## 2018-05-02 DIAGNOSIS — Z5329 Procedure and treatment not carried out because of patient's decision for other reasons: Secondary | ICD-10-CM | POA: Diagnosis present

## 2018-05-02 DIAGNOSIS — Z833 Family history of diabetes mellitus: Secondary | ICD-10-CM

## 2018-05-02 DIAGNOSIS — T148XXA Other injury of unspecified body region, initial encounter: Secondary | ICD-10-CM

## 2018-05-02 DIAGNOSIS — H409 Unspecified glaucoma: Secondary | ICD-10-CM | POA: Diagnosis present

## 2018-05-02 DIAGNOSIS — I132 Hypertensive heart and chronic kidney disease with heart failure and with stage 5 chronic kidney disease, or end stage renal disease: Secondary | ICD-10-CM | POA: Diagnosis present

## 2018-05-02 DIAGNOSIS — K219 Gastro-esophageal reflux disease without esophagitis: Secondary | ICD-10-CM | POA: Diagnosis present

## 2018-05-02 DIAGNOSIS — R569 Unspecified convulsions: Secondary | ICD-10-CM | POA: Diagnosis present

## 2018-05-02 DIAGNOSIS — I059 Rheumatic mitral valve disease, unspecified: Secondary | ICD-10-CM | POA: Diagnosis present

## 2018-05-02 DIAGNOSIS — D631 Anemia in chronic kidney disease: Secondary | ICD-10-CM | POA: Diagnosis present

## 2018-05-02 DIAGNOSIS — Z955 Presence of coronary angioplasty implant and graft: Secondary | ICD-10-CM

## 2018-05-02 DIAGNOSIS — Z79899 Other long term (current) drug therapy: Secondary | ICD-10-CM

## 2018-05-02 DIAGNOSIS — Z992 Dependence on renal dialysis: Secondary | ICD-10-CM | POA: Diagnosis not present

## 2018-05-02 DIAGNOSIS — Z9841 Cataract extraction status, right eye: Secondary | ICD-10-CM

## 2018-05-02 DIAGNOSIS — E785 Hyperlipidemia, unspecified: Secondary | ICD-10-CM | POA: Diagnosis present

## 2018-05-02 DIAGNOSIS — Z9842 Cataract extraction status, left eye: Secondary | ICD-10-CM

## 2018-05-02 DIAGNOSIS — Z7982 Long term (current) use of aspirin: Secondary | ICD-10-CM

## 2018-05-02 DIAGNOSIS — T8789 Other complications of amputation stump: Secondary | ICD-10-CM

## 2018-05-02 DIAGNOSIS — B965 Pseudomonas (aeruginosa) (mallei) (pseudomallei) as the cause of diseases classified elsewhere: Secondary | ICD-10-CM | POA: Diagnosis present

## 2018-05-02 DIAGNOSIS — T8740 Infection of amputation stump, unspecified extremity: Secondary | ICD-10-CM | POA: Diagnosis present

## 2018-05-02 DIAGNOSIS — Z66 Do not resuscitate: Secondary | ICD-10-CM | POA: Diagnosis present

## 2018-05-02 DIAGNOSIS — M79609 Pain in unspecified limb: Secondary | ICD-10-CM

## 2018-05-02 DIAGNOSIS — I509 Heart failure, unspecified: Secondary | ICD-10-CM | POA: Diagnosis not present

## 2018-05-02 DIAGNOSIS — F339 Major depressive disorder, recurrent, unspecified: Secondary | ICD-10-CM | POA: Diagnosis not present

## 2018-05-02 DIAGNOSIS — Z888 Allergy status to other drugs, medicaments and biological substances status: Secondary | ICD-10-CM

## 2018-05-02 DIAGNOSIS — Z961 Presence of intraocular lens: Secondary | ICD-10-CM | POA: Diagnosis present

## 2018-05-02 DIAGNOSIS — E1169 Type 2 diabetes mellitus with other specified complication: Secondary | ICD-10-CM | POA: Diagnosis present

## 2018-05-02 DIAGNOSIS — T8754 Necrosis of amputation stump, left lower extremity: Secondary | ICD-10-CM | POA: Diagnosis not present

## 2018-05-02 LAB — CBC WITH DIFFERENTIAL/PLATELET
Abs Immature Granulocytes: 0.02 10*3/uL (ref 0.00–0.07)
Basophils Absolute: 0 10*3/uL (ref 0.0–0.1)
Basophils Relative: 0 %
Eosinophils Absolute: 0.2 10*3/uL (ref 0.0–0.5)
Eosinophils Relative: 3 %
HCT: 38.4 % (ref 36.0–46.0)
Hemoglobin: 11.4 g/dL — ABNORMAL LOW (ref 12.0–15.0)
Immature Granulocytes: 0 %
Lymphocytes Relative: 19 %
Lymphs Abs: 1.8 10*3/uL (ref 0.7–4.0)
MCH: 28.8 pg (ref 26.0–34.0)
MCHC: 29.7 g/dL — ABNORMAL LOW (ref 30.0–36.0)
MCV: 97 fL (ref 80.0–100.0)
Monocytes Absolute: 0.9 10*3/uL (ref 0.1–1.0)
Monocytes Relative: 10 %
Neutro Abs: 6.2 10*3/uL (ref 1.7–7.7)
Neutrophils Relative %: 68 %
Platelets: 300 10*3/uL (ref 150–400)
RBC: 3.96 MIL/uL (ref 3.87–5.11)
RDW: 19.1 % — ABNORMAL HIGH (ref 11.5–15.5)
WBC: 9.2 10*3/uL (ref 4.0–10.5)
nRBC: 0 % (ref 0.0–0.2)

## 2018-05-02 LAB — COMPREHENSIVE METABOLIC PANEL
ALT: 9 U/L (ref 0–44)
AST: 19 U/L (ref 15–41)
Albumin: 3 g/dL — ABNORMAL LOW (ref 3.5–5.0)
Alkaline Phosphatase: 106 U/L (ref 38–126)
Anion gap: 10 (ref 5–15)
BUN: 6 mg/dL — ABNORMAL LOW (ref 8–23)
CO2: 28 mmol/L (ref 22–32)
Calcium: 9.1 mg/dL (ref 8.9–10.3)
Chloride: 99 mmol/L (ref 98–111)
Creatinine, Ser: 2.05 mg/dL — ABNORMAL HIGH (ref 0.44–1.00)
GFR calc Af Amer: 28 mL/min — ABNORMAL LOW (ref >60)
GFR calc non Af Amer: 24 mL/min — ABNORMAL LOW (ref >60)
Glucose, Bld: 98 mg/dL (ref 70–99)
Potassium: 2.7 mmol/L — CL (ref 3.5–5.1)
Sodium: 137 mmol/L (ref 135–145)
Total Bilirubin: 0.5 mg/dL (ref 0.3–1.2)
Total Protein: 7.2 g/dL (ref 6.5–8.1)

## 2018-05-02 LAB — SEDIMENTATION RATE: Sed Rate: 57 mm/hr — ABNORMAL HIGH (ref 0–30)

## 2018-05-02 LAB — CG4 I-STAT (LACTIC ACID): Lactic Acid, Venous: 1.16 mmol/L (ref 0.5–1.9)

## 2018-05-02 LAB — GLUCOSE, CAPILLARY: Glucose-Capillary: 108 mg/dL — ABNORMAL HIGH (ref 70–99)

## 2018-05-02 MED ORDER — CARVEDILOL 3.125 MG PO TABS
6.2500 mg | ORAL_TABLET | Freq: Two times a day (BID) | ORAL | Status: DC
  Administered 2018-05-03 – 2018-05-11 (×15): 6.25 mg via ORAL
  Filled 2018-05-02 (×7): qty 2
  Filled 2018-05-02 (×2): qty 1
  Filled 2018-05-02: qty 2
  Filled 2018-05-02 (×2): qty 1
  Filled 2018-05-02 (×4): qty 2
  Filled 2018-05-02: qty 1
  Filled 2018-05-02 (×3): qty 2
  Filled 2018-05-02: qty 1

## 2018-05-02 MED ORDER — SEVELAMER CARBONATE 800 MG PO TABS
800.0000 mg | ORAL_TABLET | Freq: Three times a day (TID) | ORAL | Status: DC
  Administered 2018-05-03 – 2018-05-12 (×21): 800 mg via ORAL
  Filled 2018-05-02 (×30): qty 1

## 2018-05-02 MED ORDER — ACETAMINOPHEN 325 MG PO TABS
650.0000 mg | ORAL_TABLET | Freq: Four times a day (QID) | ORAL | Status: DC | PRN
  Administered 2018-05-05 – 2018-05-11 (×4): 650 mg via ORAL
  Filled 2018-05-02 (×4): qty 2

## 2018-05-02 MED ORDER — SODIUM CHLORIDE 0.9 % IV SOLN
250.0000 mL | INTRAVENOUS | Status: DC | PRN
  Administered 2018-05-08: 13:00:00 via INTRAVENOUS
  Administered 2018-05-09: 20 mL via INTRAVENOUS
  Administered 2018-05-10: 100 mL via INTRAVENOUS

## 2018-05-02 MED ORDER — ATORVASTATIN CALCIUM 20 MG PO TABS
80.0000 mg | ORAL_TABLET | Freq: Every day | ORAL | Status: DC
  Administered 2018-05-03 – 2018-05-10 (×6): 80 mg via ORAL
  Filled 2018-05-02 (×9): qty 4

## 2018-05-02 MED ORDER — VANCOMYCIN HCL IN DEXTROSE 1-5 GM/200ML-% IV SOLN
1000.0000 mg | Freq: Once | INTRAVENOUS | Status: AC
  Administered 2018-05-02: 1000 mg via INTRAVENOUS
  Filled 2018-05-02: qty 200

## 2018-05-02 MED ORDER — MIRTAZAPINE 15 MG PO TABS
7.5000 mg | ORAL_TABLET | Freq: Every day | ORAL | Status: DC
  Administered 2018-05-03 – 2018-05-10 (×6): 7.5 mg via ORAL
  Filled 2018-05-02 (×9): qty 1

## 2018-05-02 MED ORDER — ROPINIROLE HCL 0.25 MG PO TABS
0.2500 mg | ORAL_TABLET | Freq: Every day | ORAL | Status: DC
  Administered 2018-05-03 – 2018-05-10 (×6): 0.25 mg via ORAL
  Filled 2018-05-02 (×12): qty 1

## 2018-05-02 MED ORDER — APIXABAN 2.5 MG PO TABS
2.5000 mg | ORAL_TABLET | Freq: Two times a day (BID) | ORAL | Status: DC
  Administered 2018-05-03 – 2018-05-11 (×13): 2.5 mg via ORAL
  Filled 2018-05-02 (×15): qty 1

## 2018-05-02 MED ORDER — VITAMIN B-12 1000 MCG PO TABS
1000.0000 ug | ORAL_TABLET | Freq: Every day | ORAL | Status: DC
  Administered 2018-05-03 – 2018-05-12 (×8): 1000 ug via ORAL
  Filled 2018-05-02 (×8): qty 1

## 2018-05-02 MED ORDER — INSULIN ASPART 100 UNIT/ML ~~LOC~~ SOLN
0.0000 [IU] | Freq: Three times a day (TID) | SUBCUTANEOUS | Status: DC
  Administered 2018-05-03 (×2): 1 [IU] via SUBCUTANEOUS
  Administered 2018-05-03: 2 [IU] via SUBCUTANEOUS
  Administered 2018-05-04 – 2018-05-06 (×5): 1 [IU] via SUBCUTANEOUS
  Administered 2018-05-06 – 2018-05-09 (×4): 2 [IU] via SUBCUTANEOUS
  Administered 2018-05-10: 1 [IU] via SUBCUTANEOUS
  Administered 2018-05-10: 3 [IU] via SUBCUTANEOUS
  Administered 2018-05-11: 1 [IU] via SUBCUTANEOUS
  Administered 2018-05-11: 3 [IU] via SUBCUTANEOUS
  Administered 2018-05-11 – 2018-05-12 (×2): 1 [IU] via SUBCUTANEOUS
  Filled 2018-05-02 (×17): qty 1

## 2018-05-02 MED ORDER — ADULT MULTIVITAMIN W/MINERALS CH
2.0000 | ORAL_TABLET | Freq: Every day | ORAL | Status: DC
  Administered 2018-05-03 – 2018-05-04 (×2): 2 via ORAL
  Filled 2018-05-02 (×2): qty 2

## 2018-05-02 MED ORDER — HEPARIN SODIUM (PORCINE) 5000 UNIT/ML IJ SOLN: 5000.0000 [IU] | Freq: Three times a day (TID) | INTRAMUSCULAR | Status: DC

## 2018-05-02 MED ORDER — ISOSORBIDE MONONITRATE ER 30 MG PO TB24
30.0000 mg | ORAL_TABLET | Freq: Every day | ORAL | Status: DC
  Administered 2018-05-03 – 2018-05-11 (×8): 30 mg via ORAL
  Filled 2018-05-02 (×11): qty 1

## 2018-05-02 MED ORDER — PIPERACILLIN-TAZOBACTAM 3.375 G IVPB
3.3750 g | Freq: Two times a day (BID) | INTRAVENOUS | Status: DC
  Administered 2018-05-03 – 2018-05-11 (×17): 3.375 g via INTRAVENOUS
  Filled 2018-05-02 (×15): qty 50

## 2018-05-02 MED ORDER — OMEGA-3-ACID ETHYL ESTERS 1 G PO CAPS
2.0000 | ORAL_CAPSULE | Freq: Every day | ORAL | Status: DC
  Administered 2018-05-03 – 2018-05-12 (×8): 2 g via ORAL
  Filled 2018-05-02 (×8): qty 2

## 2018-05-02 MED ORDER — TIMOLOL MALEATE 0.5 % OP SOLN
1.0000 [drp] | Freq: Three times a day (TID) | OPHTHALMIC | Status: DC
  Administered 2018-05-03 – 2018-05-12 (×22): 1 [drp] via OPHTHALMIC
  Filled 2018-05-02 (×2): qty 5

## 2018-05-02 MED ORDER — POLYVINYL ALCOHOL 1.4 % OP SOLN
1.0000 [drp] | Freq: Two times a day (BID) | OPHTHALMIC | Status: DC
  Administered 2018-05-03 – 2018-05-12 (×17): 1 [drp] via OPHTHALMIC
  Filled 2018-05-02: qty 15

## 2018-05-02 MED ORDER — FOLIC ACID 1 MG PO TABS
1.0000 mg | ORAL_TABLET | Freq: Every day | ORAL | Status: DC
  Administered 2018-05-03 – 2018-05-12 (×8): 1 mg via ORAL
  Filled 2018-05-02 (×9): qty 1

## 2018-05-02 MED ORDER — KCL IN DEXTROSE-NACL 40-5-0.45 MEQ/L-%-% IV SOLN: INTRAVENOUS | Status: DC

## 2018-05-02 MED ORDER — SODIUM CHLORIDE 0.9% FLUSH: 3.0000 mL | INTRAVENOUS | Status: DC | PRN

## 2018-05-02 MED ORDER — FAMOTIDINE 20 MG PO TABS
20.0000 mg | ORAL_TABLET | Freq: Two times a day (BID) | ORAL | Status: DC
  Administered 2018-05-03 – 2018-05-04 (×3): 20 mg via ORAL
  Filled 2018-05-02 (×3): qty 1

## 2018-05-02 MED ORDER — ASPIRIN EC 81 MG PO TBEC
81.0000 mg | DELAYED_RELEASE_TABLET | Freq: Every day | ORAL | Status: DC
  Administered 2018-05-03 – 2018-05-11 (×7): 81 mg via ORAL
  Filled 2018-05-02 (×7): qty 1

## 2018-05-02 MED ORDER — ADULT MULTIVITAMIN W/MINERALS CH
1.0000 | ORAL_TABLET | Freq: Every day | ORAL | Status: DC
  Administered 2018-05-05: 1 via ORAL
  Filled 2018-05-02 (×3): qty 1

## 2018-05-02 MED ORDER — VANCOMYCIN HCL IN DEXTROSE 500-5 MG/100ML-% IV SOLN
500.0000 mg | INTRAVENOUS | Status: DC
  Filled 2018-05-02: qty 100

## 2018-05-02 MED ORDER — CALCITRIOL 0.25 MCG PO CAPS
0.5000 ug | ORAL_CAPSULE | Freq: Every day | ORAL | Status: DC
  Administered 2018-05-03 – 2018-05-12 (×8): 0.5 ug via ORAL
  Filled 2018-05-02 (×11): qty 2

## 2018-05-02 MED ORDER — ONDANSETRON HCL 4 MG PO TABS: 4.0000 mg | ORAL_TABLET | Freq: Four times a day (QID) | ORAL | Status: DC | PRN

## 2018-05-02 MED ORDER — ONDANSETRON HCL 4 MG/2ML IJ SOLN: 4.0000 mg | Freq: Four times a day (QID) | INTRAMUSCULAR | Status: DC | PRN

## 2018-05-02 MED ORDER — LEVETIRACETAM 500 MG PO TABS
500.0000 mg | ORAL_TABLET | Freq: Two times a day (BID) | ORAL | Status: DC
  Administered 2018-05-03 – 2018-05-07 (×10): 500 mg via ORAL
  Filled 2018-05-02 (×14): qty 1

## 2018-05-02 MED ORDER — TRAMADOL HCL 50 MG PO TABS: 50.0000 mg | ORAL_TABLET | Freq: Three times a day (TID) | ORAL | Status: DC | PRN

## 2018-05-02 MED ORDER — SODIUM CHLORIDE 0.9% FLUSH
3.0000 mL | Freq: Two times a day (BID) | INTRAVENOUS | Status: DC
  Administered 2018-05-03 – 2018-05-11 (×15): 3 mL via INTRAVENOUS

## 2018-05-02 MED ORDER — BRIMONIDINE TARTRATE 0.2 % OP SOLN
1.0000 [drp] | Freq: Three times a day (TID) | OPHTHALMIC | Status: DC
  Administered 2018-05-03 – 2018-05-12 (×27): 1 [drp] via OPHTHALMIC
  Filled 2018-05-02: qty 5

## 2018-05-02 MED ORDER — NITROGLYCERIN 0.4 MG SL SUBL: 0.4000 mg | SUBLINGUAL_TABLET | SUBLINGUAL | Status: DC | PRN

## 2018-05-02 MED ORDER — MIDODRINE HCL 5 MG PO TABS: 10.0000 mg | ORAL_TABLET | ORAL | Status: DC

## 2018-05-02 MED ORDER — POTASSIUM CHLORIDE CRYS ER 20 MEQ PO TBCR
40.0000 meq | EXTENDED_RELEASE_TABLET | Freq: Once | ORAL | Status: AC
  Administered 2018-05-04: 40 meq via ORAL
  Filled 2018-05-02: qty 2

## 2018-05-02 MED ORDER — PIPERACILLIN-TAZOBACTAM 3.375 G IVPB 30 MIN: 3.3750 g | Freq: Once | INTRAVENOUS | Status: DC

## 2018-05-02 MED ORDER — IRBESARTAN 75 MG PO TABS
37.5000 mg | ORAL_TABLET | Freq: Every day | ORAL | Status: DC
  Administered 2018-05-03 – 2018-05-04 (×2): 37.5 mg via ORAL
  Filled 2018-05-02 (×4): qty 0.5

## 2018-05-02 MED ORDER — HYDRALAZINE HCL 20 MG/ML IJ SOLN
10.0000 mg | Freq: Four times a day (QID) | INTRAMUSCULAR | Status: DC | PRN
  Administered 2018-05-02 – 2018-05-11 (×2): 10 mg via INTRAVENOUS
  Filled 2018-05-02 (×2): qty 1

## 2018-05-02 MED ORDER — ONDANSETRON HCL 4 MG PO TABS: 4.0000 mg | ORAL_TABLET | Freq: Three times a day (TID) | ORAL | Status: DC | PRN

## 2018-05-02 MED ORDER — LEVETIRACETAM 250 MG PO TABS
250.0000 mg | ORAL_TABLET | ORAL | Status: DC
  Filled 2018-05-02: qty 1

## 2018-05-02 MED ORDER — DOCUSATE SODIUM 100 MG PO CAPS
100.0000 mg | ORAL_CAPSULE | Freq: Two times a day (BID) | ORAL | Status: DC
  Administered 2018-05-03 – 2018-05-12 (×13): 100 mg via ORAL
  Filled 2018-05-02 (×16): qty 1

## 2018-05-02 NOTE — ED Notes (Signed)
Pt cleared by admitting MD to eat. Pt given sandwich tray and and water to drink.

## 2018-05-02 NOTE — ED Notes (Signed)
Attempted to give report at this time. Report given to [facility], who states pt must be given BP medication prior to transfer of Pt care.

## 2018-05-02 NOTE — Progress Notes (Signed)
Advanced care plan.  Purpose of the Encounter: CODE STATUS  Parties in Attendance: Patient herself and daughter  Patient's Decision Capacity: Intact  Subjective/Patient's story: Stephanie Ellis  is a 68 y.o. female with a known history of end-stage renal disease, peripheral vascular disease, essential hypertension, diabetes type 2 who was recently hospitalized and had bilateral below the knee amputation in November.    Objective/Medical story  I discussed with the patient and confirm her wishes for DNR  Goals of care determination:   Patient states that she would like to remain DNR  CODE STATUS: DNR   Time spent discussing advanced care planning: 16 minutes

## 2018-05-02 NOTE — ED Triage Notes (Signed)
PT to ER from [facility] with c/o left stump infection.  Pt had bilateral BKA in Nov.  Right stump healing well with sutures.  Left stump covered in ABD and kerlix, noted odor from wound

## 2018-05-02 NOTE — Progress Notes (Signed)
Pharmacy Antibiotic Note  Zoe Creasman is a 68 y.o. female admitted on 05/02/2018 with osteo/wound infection.  Pharmacy has been consulted for Zosyn and vancomycin dosing.  Plan: 1. Zosyn 3.375 gm IV Q12H EI 2. Vancomycin 1000 mg IV x 1 in ED as loading dose followed by vancomycin 500 mg IV Q-dialysis T-T-S.   Height: 5' 4"  (162.6 cm) Weight: 104 lb (47.2 kg) IBW/kg (Calculated) : 54.7  Temp (24hrs), Avg:98.3 F (36.8 C), Min:98.3 F (36.8 C), Max:98.3 F (36.8 C)  Recent Labs  Lab 05/02/18 1820 05/02/18 1837  WBC 9.2  --   CREATININE 2.05*  --   LATICACIDVEN  --  1.16    Estimated Creatinine Clearance: 19.8 mL/min (A) (by C-G formula based on SCr of 2.05 mg/dL (H)).    Allergies  Allergen Reactions  . Chlorthalidone Anaphylaxis, Itching and Rash  . Fentanyl Rash  . Midazolam Rash  . Ace Inhibitors Other (See Comments)    Reaction:  Hyperkalemia, agitation   . Angiotensin Receptor Blockers Other (See Comments)    Reaction:  Hyperkalemia, agitation   . Norvasc [Amlodipine] Itching and Rash  . Phenergan [Promethazine Hcl] Anxiety    "antsy, can't sit still"    Antimicrobials this admission:   Dose adjustments this admission:   Microbiology results:  BCx:   UCx:    Sputum:    MRSA PCR:   Thank you for allowing pharmacy to be a part of this patient's care.  Laural Benes, PharmD, BCPS Clinical Pharmacist 05/02/2018 8:27 PM

## 2018-05-02 NOTE — H&P (Signed)
[facility] NAME: Stephanie Ellis    MR#:  [id number]  DATE OF BIRTH:  1950/07/30  DATE OF ADMISSION:  05/02/2018  PRIMARY CARE PHYSICIAN: Hanley Ben, NP   REQUESTING/REFERRING PHYSICIAN: Earleen Newport, MD  CHIEF COMPLAINT:   Chief Complaint  Patient presents with  . Wound Infection    HISTORY OF PRESENT ILLNESS: Stephanie Ellis  is a 68 y.o. female with a known history of end-stage renal disease, peripheral vascular disease, essential hypertension, diabetes type 2 who was recently hospitalized and had bilateral below the knee amputation in November.  Subsequently patient developed left stump with drainage and nonhealing wound.  Patient required wound VAC placement patient was discharged to rehab recently.  Apparently the wound was having a foul smell therefore the wound VAC was removed patient is sent to the ED here for further evaluation.  Were asked to admit the patient for further treatment.  Patient otherwise denies any fevers chills no chest pain or shortness of breath PAST MEDICAL HISTORY:   Past Medical History:  Diagnosis Date  . (HFpEF) heart failure with preserved ejection fraction ([place])    a. 01/2018 Echo: EF 50-55%, no rwma, Gr1 DD, triv AI, mild MR. Midly dil LA/RV, mod reduced RV fxn. Irreg thickening of TV w/ mobile echodensity that appears to arise from valve.  . Anemia   . Anorexia   . Bacteremia due to Pseudomonas   . Carotid arterial disease ([place])    a. 01/2017 Carotid U/S: RICA <40, LICA <98.  Marland Kitchen Complication of anesthesia    a. Pt reports h/o complication on 9 different occasions - ? hypotension/arrest.  . 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 01/2015 Cath [facility]): 70% LM, p-mLAD 50-60% (Resting FFR 0.75), mRCA 80-90%, ~40 Ost OM & D1-->CABG; b. 01/2016 Staged PCI of LCX x 2 and RCA.  Marland Kitchen ESRD (end stage renal disease) on dialysis ([place])    a. ESRD secondary to acute kidney  failure s/p CABG-->PD  . Essential hypertension   . GERD (gastroesophageal reflux disease)   . Heart murmur   . Hypercholesterolemia   . Myocardial infarction ([place]) 2016  . PVD (peripheral vascular disease) ([place])    a. 01/30/2018 PV Angio: Sev Left Popliteal dzs s/p PTA w/ 31m lutonix DEB w/ mech aspiration of the L popliteal, L AT, and Left tibioperoneal trunck and peroneal artery.  . 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  [facility]: Dr. BMarland KitchenHaithcock: CABG x 3, LIMA to LAD, SVG to RCA, SVG to OM3, EVH  . Sinusitis 2019  . Type II diabetes mellitus with complication (HCC)    CAD    PAST SURGICAL HISTORY:  Past Surgical History:  Procedure Laterality Date  . ABDOMINAL HYSTERECTOMY    . AMPUTATION Right 02/25/2018   Procedure: AMPUTATION BELOW KNEE;  Surgeon: SKatha Cabal MD;  Location: [facility] ORS;  Service: Vascular;  Laterality: Right;  . AMPUTATION Left 03/11/2018   Procedure: AMPUTATION BELOW KNEE;  Surgeon: DAlgernon Huxley MD;  Location: [facility] OR[facility];  Service: Vascular;  Laterality: Left;  . AMPUTATION Bilateral 04/13/2018   Procedure: REVISION OF BILATERAL AMPUTATIONS BELOW KNEE WITH WOUND VAC APPLICATIONS;  Surgeon: EEvaristo Bury MD;  Location: [facility];  Service: Vascular;  Laterality: Bilateral;  . AMPUTATION TOE Left 02/06/2018   Procedure: AMPUTATION GREAT TOE;  Surgeon: FSamara Deist DPM;  Location: [facility] OR[facility];  Service: Podiatry;  Laterality: Left;  .  APPLICATION OF WOUND VAC Left 04/13/2018   Procedure: APPLICATION OF WOUND VAC;  Surgeon: Evaristo Bury, MD;  Location: [facility] OR[facility];  Service: Vascular;  Laterality: Left;  . ARTERIOVENOUS GRAFT PLACEMENT  05/2016  . AV FISTULA PLACEMENT Left 05/30/2016   Procedure: ARTERIOVENOUS graft;  Surgeon: Algernon Huxley, MD;  Location: [facility];  Service: Vascular;  Laterality: Left;  . CAPD INSERTION N/A 10/30/2017   Procedure: LAPAROSCOPIC INSERTION CONTINUOUS AMBULATORY PERITONEAL DIALYSIS  (CAPD) CATHETER;  Surgeon: Algernon Huxley, MD;   Location: [facility];  Service: Vascular;  Laterality: N/A;  . CARDIAC CATHETERIZATION  01/2015   [facility]: Ost LM 70%, p-m LAD 50-60% (Rest FFR + @ 0.75), mRCA 80-90%, ostD1 40%, pOM1 40%  . CATARACT EXTRACTION W/PHACO Left 01/18/2016   Procedure: CATARACT EXTRACTION PHACO AND INTRAOCULAR LENS PLACEMENT (IOC);  Surgeon: Eulogio Bear, MD;  Location: [facility];  Service: Ophthalmology;  Laterality: Left;  [place] 1.05 AP% 15.5 CDE 10.16 Fluid Pack Lot # [id number]  . CATARACT EXTRACTION W/PHACO Right 08/01/2016   Procedure: CATARACT EXTRACTION PHACO AND INTRAOCULAR LENS PLACEMENT (IOC);  Surgeon: Eulogio Bear, MD;  Location: [facility];  Service: Ophthalmology;  Laterality: Right;  [place] 01:00.6 AP% 11.4 CDE 6.93  LOT # [id number]  . COLONOSCOPY    . CORONARY ANGIOPLASTY     SENTS 02/12/16  . CORONARY ARTERY BYPASS GRAFT  03/28/15    [facility]: Dr. Waldemar Dickens: LIMA to LAD, SVG to RCA, SVG to OM3, EVH  . DIALYSIS/PERMA CATHETER INSERTION N/A 02/11/2018   Procedure: DIALYSIS/PERMA CATHETER INSERTION;  Surgeon: Algernon Huxley, MD;  Location: [place] [facility];  Service: Cardiovascular;  Laterality: N/A;  . DIALYSIS/PERMA CATHETER REMOVAL Right 02/04/2018   Procedure: DIALYSIS/PERMA CATHETER REMOVAL;  Surgeon: Katha Cabal, MD;  Location: [place] [facility];  Service: Cardiovascular;  Laterality: Right;  . ESOPHAGOGASTRODUODENOSCOPY (EGD) WITH PROPOFOL N/A 10/24/2016   Procedure: ESOPHAGOGASTRODUODENOSCOPY (EGD) WITH PROPOFOL;  Surgeon: [name], MD;  Location: [facility];  Service: Endoscopy;  Laterality: N/A;  . EYE SURGERY Bilateral    cataract surgery  . EYE SURGERY     drains for glaucoma  . INSERTION EXPRESS TUBE SHUNT Right 08/01/2016   Procedure: INSERTION EXPRESS TUBE SHUNT;  Surgeon: Eulogio Bear, MD;  Location: [facility];  Service: Ophthalmology;  Laterality: Right;  . INSERTION OF AHMED VALVE Left 08/15/2016   Procedure: INSERTION OF AHMED VALVE;  Surgeon: Eulogio Bear, MD;   Location: [facility];  Service: Ophthalmology;  Laterality: Left;  . INSERTION OF DIALYSIS CATHETER    . LOWER EXTREMITY ANGIOGRAPHY Right 02/19/2018   Procedure: Lower Extremity Angiography;  Surgeon: Algernon Huxley, MD;  Location: [place] [facility];  Service: Cardiovascular;  Laterality: Right;  . LOWER EXTREMITY ANGIOGRAPHY Left 01/30/2018   Procedure: Lower Extremity Angiography;  Surgeon: Katha Cabal, MD;  Location: [place] [facility];  Service: Cardiovascular;  Laterality: Left;  . REMOVAL OF A DIALYSIS CATHETER N/A 02/25/2018   Procedure: REMOVAL OF A DIALYSIS CATHETER;  Surgeon: Katha Cabal, MD;  Location: [facility];  Service: Vascular;  Laterality: N/A;  . TEMPORARY DIALYSIS CATHETER N/A 02/06/2018   Procedure: TEMPORARY DIALYSIS CATHETER;  Surgeon: Katha Cabal, MD;  Location: [place] [facility];  Service: Cardiovascular;  Laterality: N/A;  . TRANSTHORACIC ECHOCARDIOGRAM  January 2017    EF 60-65%. GR 2 DD. Mild degenerative mitral valve disease but no prolapse or regurgitation. Mild left atrial dilation. Mild to moderate LVH. Pericardial effusion  gone  . UPPER EXTREMITY ANGIOGRAPHY Left 06/27/2016   Procedure: Upper Extremity Angiography;  Surgeon: Algernon Huxley, MD;  Location: [place] [facility];  Service: Cardiovascular;  Laterality: Left;  . UPPER EXTREMITY ANGIOGRAPHY Left 08/22/2016   Procedure: Upper Extremity Angiography;  Surgeon: Algernon Huxley, MD;  Location: [place] [facility];  Service: Cardiovascular;  Laterality: Left;  Marland Kitchen VASCULAR SURGERY      SOCIAL HISTORY:  Social History   Tobacco Use  . Smoking status: Never Smoker  . Smokeless tobacco: Never Used  Substance Use Topics  . Alcohol use: No    FAMILY HISTORY:  Family History  Problem Relation Age of Onset  . Diabetes Mellitus II Mother   . Heart failure Mother   . Pancreatic cancer Father     DRUG ALLERGIES:  Allergies  Allergen Reactions  . Chlorthalidone Anaphylaxis, Itching and  Rash  . Fentanyl Rash  . Midazolam Rash  . Ace Inhibitors Other (See Comments)    Reaction:  Hyperkalemia, agitation   . Angiotensin Receptor Blockers Other (See Comments)    Reaction:  Hyperkalemia, agitation   . Norvasc [Amlodipine] Itching and Rash  . Phenergan [Promethazine Hcl] Anxiety    "antsy, can't sit still"    REVIEW OF SYSTEMS:   CONSTITUTIONAL: No fever, fatigue or weakness.  EYES: No blurred or double vision.  EARS, NOSE, AND THROAT: No tinnitus or ear pain.  RESPIRATORY: No cough, shortness of breath, wheezing or hemoptysis.  CARDIOVASCULAR: No chest pain, orthopnea, edema.  GASTROINTESTINAL: No nausea, vomiting, diarrhea or abdominal pain.  GENITOURINARY: No dysuria, hematuria.  ENDOCRINE: No polyuria, nocturia,  HEMATOLOGY: No anemia, easy bruising or bleeding SKIN: No rash or lesion.  Drainage from the left stump MUSCULOSKELETAL: No joint pain or arthritis.   NEUROLOGIC: No tingling, numbness, weakness.  PSYCHIATRY: No anxiety or depression.   MEDICATIONS AT HOME:  Prior to Admission medications   Medication Sig Start Date End Date Taking? Authorizing Provider  acetaminophen (TYLENOL) 325 MG tablet Take 2 tablets (650 mg total) by mouth every 6 (six) hours as needed for mild pain (or Fever >/= 101). 03/23/18   [name], MD  apixaban (ELIQUIS) 2.5 MG TABS tablet Take 1 tablet (2.5 mg total) by mouth 2 (two) times daily. 02/15/18   Vaughan Basta, MD  aspirin EC 81 MG tablet Take 1 tablet (81 mg total) by mouth daily. 10/19/16   Dustin Flock, MD  atorvastatin (LIPITOR) 80 MG tablet Take 80 mg by mouth at bedtime.  05/05/17   [provider]  brimonidine (ALPHAGAN) 0.2 % ophthalmic solution Place 1 drop into both eyes 3 (three) times daily. 03/18/18   Bettey Costa, MD  calcitRIOL (ROCALTROL) 0.5 MCG capsule Take 0.5 mcg by mouth daily.    [provider]  carvedilol (COREG) 6.25 MG tablet Take 1 tablet (6.25 mg total) by mouth 2 (two)  times daily. 03/18/18   Bettey Costa, MD  docusate sodium (COLACE) 100 MG capsule Take 1 capsule (100 mg total) by mouth 2 (two) times daily. 02/15/18   Vaughan Basta, MD  doxycycline (VIBRAMYCIN) 100 MG capsule Take 1 capsule (100 mg total) by mouth 2 (two) times daily. 03/30/18   Kris Hartmann, NP  epoetin alfa (EPOGEN,PROCRIT) 65465 UNIT/ML injection Inject 1 mL (10,000 Units total) into the vein Every Tuesday,Thursday,and Saturday with dialysis. 03/24/18   [name], MD  famotidine (PEPCID) 20 MG tablet Take 20 mg by mouth 2 (two) times daily.    [provider]  folic acid (FOLVITE) 1 MG tablet Take 1 tablet (1 mg total) by mouth daily. 02/15/18 02/15/19  Vaughan Basta, MD  isosorbide mononitrate (IMDUR) 30 MG 24 hr tablet Take 30 mg by mouth daily.     [provider]  levETIRAcetam (KEPPRA) 250 MG tablet Take 1 tablet (250 mg total) by mouth 3 (three) times a week. 03/25/18   [name], MD  levETIRAcetam (KEPPRA) 500 MG tablet Take 1 tablet (500 mg total) by mouth 2 (two) times daily. 03/23/18   Gouru, Illene Silver, MD  midodrine (PROAMATINE) 10 MG tablet Take 1 tablet (10 mg total) by mouth 3 (three) times a week. Patient taking differently: Take 10 mg by mouth 3 (three) times a week. Take Tuesday, Thursday, and Saturday 02/17/18   Vaughan Basta, MD  mirtazapine (REMERON) 7.5 MG tablet Take by mouth. 12/18/17   [provider]  Multiple Vitamin (MULTIVITAMIN WITH MINERALS) TABS tablet Take 2 tablets by mouth daily. Gummy vitamins    [provider]  Multiple Vitamins-Minerals (PRORENAL + D) TABS Take 1 tablet by mouth daily.    [provider]  nitroGLYCERIN (NITROSTAT) 0.4 MG SL tablet Place 1 tablet (0.4 mg total) under the tongue every 5 (five) minutes as needed for chest pain. 09/05/16   Loletha Grayer, MD  Omega-3 1000 MG CAPS Take 2 capsules by mouth daily.     [provider]  ondansetron (ZOFRAN) 4 MG tablet  Take 4 mg by mouth every 8 (eight) hours as needed for nausea or vomiting.    [provider]  Polyethyl Glycol-Propyl Glycol 0.4-0.3 % SOLN Place 1 drop into both eyes 2 (two) times daily.    [provider]  rOPINIRole (REQUIP) 0.25 MG tablet Take by mouth. 12/02/16   [provider]  sevelamer carbonate (RENVELA) 800 MG tablet Take 800 mg by mouth 3 (three) times daily with meals.    [provider]  timolol (TIMOPTIC) 0.5 % ophthalmic solution Place 1 drop into both eyes 3 (three) times daily. 03/18/18   Bettey Costa, MD  traMADol (ULTRAM) 50 MG tablet Take 1 tablet (50 mg total) by mouth every 8 (eight) hours as needed for moderate pain or severe pain. 03/23/18   Gouru, Illene Silver, MD  valsartan (DIOVAN) 80 MG tablet Take by mouth. 12/03/17 12/03/18  [provider]  vitamin B-12 (CYANOCOBALAMIN) 1000 MCG tablet Take 1 tablet (1,000 mcg total) by mouth daily. 02/15/18   Vaughan Basta, MD      PHYSICAL EXAMINATION:   VITAL SIGNS: Blood pressure (!) 197/70, temperature 98.3 F (36.8 C), temperature source Oral, resp. rate 18, height 5' 4"  (1.626 m), weight 47.2 kg, SpO2 97 %.  GENERAL:  68 y.o.-year-old patient lying in the bed with no acute distress.  EYES: Pupils equal, round, reactive to light and accommodation. No scleral icterus. Extraocular muscles intact.  HEENT: Head atraumatic, normocephalic. Oropharynx and nasopharynx clear.  NECK:  Supple, no jugular venous distention. No thyroid enlargement, no tenderness.  LUNGS: Normal breath sounds bilaterally, no wheezing, rales,rhonchi or crepitation. No use of accessory muscles of respiration.  CARDIOVASCULAR: S1, S2 normal. No murmurs, rubs, or gallops.  ABDOMEN: Soft, nontender, nondistended. Bowel sounds present. No organomegaly or mass.  EXTREMITIES: Left stump with some white drainage with pink tissue NEUROLOGIC: Cranial nerves II through XII are intact. Muscle strength 5/5 in all  extremities. Sensation intact. Gait not checked.  PSYCHIATRIC: The patient is alert and oriented x 3.  SKIN: No obvious rash, lesion,  or ulcer.   LABORATORY PANEL:   CBC Recent Labs  Lab 05/02/18 1820  WBC 9.2  HGB 11.4*  HCT 38.4  PLT 300  MCV 97.0  MCH 28.8  MCHC 29.7*  RDW 19.1*  LYMPHSABS 1.8  MONOABS 0.9  EOSABS 0.2  BASOSABS 0.0   ------------------------------------------------------------------------------------------------------------------  Chemistries  Recent Labs  Lab 05/02/18 1820  NA 137  K 2.7*  CL 99  CO2 28  GLUCOSE 98  BUN 6*  CREATININE 2.05*  CALCIUM 9.1  AST 19  ALT 9  ALKPHOS 106  BILITOT 0.5   ------------------------------------------------------------------------------------------------------------------ estimated creatinine clearance is 19.8 mL/min (A) (by C-G formula based on SCr of 2.05 mg/dL (H)). ------------------------------------------------------------------------------------------------------------------ No results for input(s): TSH, T4TOTAL, T3FREE, THYROIDAB in the last 72 hours.  Invalid input(s): FREET3   Coagulation profile No results for input(s): INR, PROTIME in the last 168 hours. ------------------------------------------------------------------------------------------------------------------- No results for input(s): DDIMER in the last 72 hours. -------------------------------------------------------------------------------------------------------------------  Cardiac Enzymes No results for input(s): CKMB, TROPONINI, MYOGLOBIN in the last 168 hours.  Invalid input(s): CK ------------------------------------------------------------------------------------------------------------------ Invalid input(s): POCBNP  ---------------------------------------------------------------------------------------------------------------  Urinalysis    Component Value Date/Time   COLORURINE RED (A) 10/17/2016 1643    APPEARANCEUR TURBID (A) 10/17/2016 1643   LABSPEC 1.014 10/17/2016 1643   PHURINE  10/17/2016 1643    TEST NOT REPORTED DUE TO COLOR INTERFERENCE OF URINE PIGMENT   GLUCOSEU (A) 10/17/2016 1643    TEST NOT REPORTED DUE TO COLOR INTERFERENCE OF URINE PIGMENT   HGBUR (A) 10/17/2016 1643    TEST NOT REPORTED DUE TO COLOR INTERFERENCE OF URINE PIGMENT   BILIRUBINUR (A) 10/17/2016 1643    TEST NOT REPORTED DUE TO COLOR INTERFERENCE OF URINE PIGMENT   KETONESUR (A) 10/17/2016 1643    TEST NOT REPORTED DUE TO COLOR INTERFERENCE OF URINE PIGMENT   PROTEINUR (A) 10/17/2016 1643    TEST NOT REPORTED DUE TO COLOR INTERFERENCE OF URINE PIGMENT   NITRITE (A) 10/17/2016 1643    TEST NOT REPORTED DUE TO COLOR INTERFERENCE OF URINE PIGMENT   LEUKOCYTESUR (A) 10/17/2016 1643    TEST NOT REPORTED DUE TO COLOR INTERFERENCE OF URINE PIGMENT     RADIOLOGY: Dg Knee 1-2 Views Left  Result Date: 05/02/2018 CLINICAL DATA:  Left stump pain. EXAM: LEFT KNEE - 1-2 VIEW COMPARISON:  None. FINDINGS: Patient is status post below the knee amputation. No evidence for bony erosion or destruction at the level of amputation. There appears to be a soft tissue gas medial to the tibial osteotomy site on the frontal film but this is not confirmed on the lateral projection and the soft tissue at the amputation site is redundant which could generate this appearance. IMPRESSION: Status post below-the-knee amputation with what appears to be soft tissue gas medial to the tibial osteotomy site however redundancy of the overlying soft tissues could cause this appearance. If there is clinical concern for soft tissue infection or osteomyelitis, MRI without and with contrast (renal function permitting) would be the study of choice to further evaluate. Electronically Signed   By: [name] Stanley M.D.   On: 05/02/2018 18:39    EKG: Orders placed or performed during the hospital encounter of 04/23/18  . EKG 12-Lead  . EKG 12-Lead     IMPRESSION AND PLAN: Patient is a 68 year old with end-stage renal disease who is being admitted with possible stump infection  1.  Left stump infected MRI has been ordered to rule out osteomyelitis further tissue infection I have contacted via  phone call and chart the on-call vascular surgeon to evaluate the patient in the morning We will treat patient with IV vancomycin and Zosyn  2.  End-stage renal disease nephrology has been noted notified of patient's admission requiring dialysis on her dialysis days  3.  Accelerated hypertension I will add IV hydralazine PRN Continue therapy with Coreg, Imdur and Diovan  4.  History of seizures continue Keppra  5.  Diabetes type 2 we will place her on sliding scale insulin  6.  Hyperlipidemia continue Lipitor  7.  Miscellaneous heparin for DVT prophylaxis   All the records are reviewed and case discussed with ED provider. Management plans discussed with the patient, family and they are in agreement.  CODE STATUS: Code Status History    Date Active Date Inactive Code Status Order ID Comments User Context   04/13/2018 1448 04/16/2018 2342 DNR [id number]  Fritzi Mandes, MD Inpatient   03/19/2018 1215 03/23/2018 1906 DNR [id number]  Fritzi Mandes, MD Inpatient   03/14/2018 2310 03/18/2018 2018 DNR [id number]  Erlene Quan, NP Inpatient   03/10/2018 0837 03/14/2018 2310 Full Code [id number]  Gladstone Lighter, MD Inpatient   02/18/2018 1000 02/28/2018 2122 Full Code [id number]  Loletha Grayer, MD ED   02/01/2018 0412 02/15/2018 2250 Full Code [id number]  Arta Silence, MD Inpatient   01/31/2018 2300 02/01/2018 0412 DNR [id number]  Amelia Jo, MD Inpatient   01/31/2018 2037 01/31/2018 2300 DNR [id number]  Awilda Bill, NP Inpatient   01/29/2018 2250 01/31/2018 2037 Full Code [id number]  Amelia Jo, MD Inpatient   10/24/2016 1055 10/25/2016 2243 Full Code [id number]  Hillary Bow, MD ED   10/17/2016 2143 10/19/2016 1440 Full Code  [id number]  Vaughan Basta, MD Inpatient   09/04/2016 1708 09/05/2016 1500 Full Code [id number]  Epifanio Lesches, MD ED   08/22/2016 0938 08/22/2016 1833 Full Code [id number]  Algernon Huxley, MD Inpatient   06/27/2016 1121 06/27/2016 1846 Full Code [id number]  Algernon Huxley, MD Inpatient   10/03/2015 2235 10/04/2015 1724 Full Code [id number]  Harrie Foreman, MD Inpatient    Questions for Most Recent Historical Code Status (Order [id number])    Question Answer Comment   In the event of cardiac or respiratory ARREST Do not call a "code blue"    In the event of cardiac or respiratory ARREST Do not perform Intubation, CPR, defibrillation or ACLS    In the event of cardiac or respiratory ARREST Use medication by any route, position, wound care, and other measures to relive pain and suffering. May use oxygen, suction and manual treatment of airway obstruction as needed for comfort.    Comments DNI         Advance Directive Documentation     Most Recent Value  Type of Advance Directive  Out of facility DNR (pink MOST or yellow form)  Pre-existing out of facility DNR order (yellow form or pink MOST form)  Yellow form placed in chart (order not valid for inpatient use)  "MOST" Form in Place?  -       TOTAL TIME TAKING CARE OF THIS PATIENT:60mnutes.    SDustin FlockM.D on 05/02/2018 at 7:39 PM  Between 7am to 6pm - Pager - [phone]  After 6pm go to [email or web address] - password [email or web address] [facility]  [phone] CC: Primary care physician; DHanley Ben NP

## 2018-05-02 NOTE — ED Notes (Signed)
ED TO INPATIENT HANDOFF REPORT  Name/Age/Gender Stephanie Ellis 68 y.o. female  Code Status Code Status History    Date Active Date Inactive Code Status Order ID Comments User Context   04/13/2018 1448 04/16/2018 2342 DNR [id number]  Fritzi Mandes, MD Inpatient   03/19/2018 1215 03/23/2018 1906 DNR [id number]  Fritzi Mandes, MD Inpatient   03/14/2018 2310 03/18/2018 2018 DNR [id number]  Erlene Quan, NP Inpatient   03/10/2018 0837 03/14/2018 2310 Full Code [id number]  Gladstone Lighter, MD Inpatient   02/18/2018 1000 02/28/2018 2122 Full Code [id number]  Loletha Grayer, MD ED   02/01/2018 0412 02/15/2018 2250 Full Code [id number]  Arta Silence, MD Inpatient   01/31/2018 2300 02/01/2018 0412 DNR [id number]  Amelia Jo, MD Inpatient   01/31/2018 2037 01/31/2018 2300 DNR [id number]  Awilda Bill, NP Inpatient   01/29/2018 2250 01/31/2018 2037 Full Code [id number]  Amelia Jo, MD Inpatient   10/24/2016 1055 10/25/2016 2243 Full Code [id number]  Hillary Bow, MD ED   10/17/2016 2143 10/19/2016 1440 Full Code [id number]  Vaughan Basta, MD Inpatient   09/04/2016 1708 09/05/2016 1500 Full Code [id number]  Epifanio Lesches, MD ED   08/22/2016 0938 08/22/2016 1833 Full Code [id number]  Algernon Huxley, MD Inpatient   06/27/2016 1121 06/27/2016 1846 Full Code [id number]  Algernon Huxley, MD Inpatient   10/03/2015 2235 10/04/2015 1724 Full Code [id number]  Harrie Foreman, MD Inpatient    Questions for Most Recent Historical Code Status (Order [id number])    Question Answer Comment   In the event of cardiac or respiratory ARREST Do not call a "code blue"    In the event of cardiac or respiratory ARREST Do not perform Intubation, CPR, defibrillation or ACLS    In the event of cardiac or respiratory ARREST Use medication by any route, position, wound care, and other measures to relive pain and suffering. May use oxygen, suction and manual treatment of airway obstruction as needed for comfort.     Comments DNI         Advance Directive Documentation     Most Recent Value  Type of Advance Directive  Out of facility DNR (pink MOST or yellow form)  Pre-existing out of facility DNR order (yellow form or pink MOST form)  Yellow form placed in chart (order not valid for inpatient use)  "MOST" Form in Place?  -      Home/SNF/Other Insurance claims handler Complaint Infection  Level of Care/Admitting Diagnosis ED Disposition    ED Disposition Condition [place]: [place] [[id number]]  Level of Care: Med-Surg [16]  Diagnosis: Infection of amputation stump [facility]) [[id number]]  Admitting Physician: Dustin Flock [[id number]]  Attending Physician: Dustin Flock [phone]  Estimated length of stay: past midnight tomorrow  Certification:: I certify this patient will need inpatient services for at least 2 midnights  PT Class (Do Not Modify): Inpatient [101]  PT Acc Code (Do Not Modify): Private [1]       Medical History Past Medical History:  Diagnosis Date  . (HFpEF) heart failure with preserved ejection fraction ([place])    a. 01/2018 Echo: EF 50-55%, no rwma, Gr1 DD, triv AI, mild MR. Midly dil LA/RV, mod reduced RV fxn. Irreg thickening of TV w/ mobile echodensity that appears to arise from valve.  . Anemia   . Anorexia   . Bacteremia due to Pseudomonas   . Carotid arterial disease ([place])    a. 01/2017 Carotid U/S: RICA <40,  LICA <63.  Marland Kitchen Complication of anesthesia    a. Pt reports h/o complication on 9 different occasions - ? hypotension/arrest.  . 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 01/2015 Cath [facility]): 70% LM, p-mLAD 50-60% (Resting FFR 0.75), mRCA 80-90%, ~40 Ost OM & D1-->CABG; b. 01/2016 Staged PCI of LCX x 2 and RCA.  Marland Kitchen ESRD (end stage renal disease) on dialysis ([place])    a. ESRD secondary to acute kidney failure s/p CABG-->PD  . Essential hypertension   . GERD (gastroesophageal reflux disease)   .  Heart murmur   . Hypercholesterolemia   . Myocardial infarction ([place]) 2016  . PVD (peripheral vascular disease) ([place])    a. 01/30/2018 PV Angio: Sev Left Popliteal dzs s/p PTA w/ 26m lutonix DEB w/ mech aspiration of the L popliteal, L AT, and Left tibioperoneal trunck and peroneal artery.  . 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  [facility]: Dr. BMarland KitchenHaithcock: CABG x 3, LIMA to LAD, SVG to RCA, SVG to OM3, EVH  . Sinusitis 2019  . Type II diabetes mellitus with complication (HCC)    CAD    Allergies Allergies  Allergen Reactions  . Chlorthalidone Anaphylaxis, Itching and Rash  . Fentanyl Rash  . Midazolam Rash  . Ace Inhibitors Other (See Comments)    Reaction:  Hyperkalemia, agitation   . Angiotensin Receptor Blockers Other (See Comments)    Reaction:  Hyperkalemia, agitation   . Norvasc [Amlodipine] Itching and Rash  . Phenergan [Promethazine Hcl] Anxiety    "antsy, can't sit still"    IV Location/Drains/Wounds Patient Lines/Drains/Airways Status   Active Line/Drains/Airways    Name:   Placement date:   Placement time:   Site:   Days:   Peripheral IV 05/02/18 Left Wrist   05/02/18    1835    Wrist   less than 1   Hemodialysis Catheter Left Subclavian Double-lumen   02/11/18    -    Subclavian   80   Incision (Closed) 04/13/18 Leg   04/13/18    1341     19          Labs/Imaging Results for orders placed or performed during the hospital encounter of 05/02/18 (from the past 48 hour(s))  CBC with Differential/Platelet     Status: Abnormal   Collection Time: 05/02/18  6:20 PM  Result Value Ref Range   WBC 9.2 4.0 - 10.5 K/uL   RBC 3.96 3.87 - 5.11 MIL/uL   Hemoglobin 11.4 (L) 12.0 - 15.0 g/dL   HCT 38.4 36.0 - 46.0 %   MCV 97.0 80.0 - 100.0 fL   MCH 28.8 26.0 - 34.0 pg   MCHC 29.7 (L) 30.0 - 36.0 g/dL   RDW 19.1 (H) 11.5 - 15.5 %   Platelets 300 150 - 400 K/uL   nRBC 0.0 0.0 - 0.2 %   Neutrophils Relative % 68 %   Neutro Abs 6.2 1.7 - 7.7 K/uL   Lymphocytes Relative 19 %    Lymphs Abs 1.8 0.7 - 4.0 K/uL   Monocytes Relative 10 %   Monocytes Absolute 0.9 0.1 - 1.0 K/uL   Eosinophils Relative 3 %   Eosinophils Absolute 0.2 0.0 - 0.5 K/uL   Basophils Relative 0 %   Basophils Absolute 0.0 0.0 - 0.1 K/uL   Immature Granulocytes 0 %   Abs Immature Granulocytes 0.02 0.00 - 0.07 K/uL    Comment: [facility] [address] [place], [place] [address]  Comprehensive metabolic panel     Status: Abnormal   Collection Time: 05/02/18  6:20 PM  Result Value Ref Range   Sodium 137 135 - 145 mmol/L   Potassium 2.7 (LL) 3.5 - 5.1 mmol/L    Comment: CRITICAL RESULT CALLED TO, READ BACK BY AND VERIFIED WITH JENNIFER WHITLEY ON 05/02/2018 AT 1901 QSD    Chloride 99 98 - 111 mmol/L   CO2 28 22 - 32 mmol/L   Glucose, Bld 98 70 - 99 mg/dL   BUN 6 (L) 8 - 23 mg/dL   Creatinine, Ser 2.05 (H) 0.44 - 1.00 mg/dL   Calcium 9.1 8.9 - 10.3 mg/dL   Total Protein 7.2 6.5 - 8.1 g/dL   Albumin 3.0 (L) 3.5 - 5.0 g/dL   AST 19 15 - 41 U/L   ALT 9 0 - 44 U/L   Alkaline Phosphatase 106 38 - 126 U/L   Total Bilirubin 0.5 0.3 - 1.2 mg/dL   GFR calc non Af Amer 24 (L) >60 mL/min   GFR calc Af Amer 28 (L) >60 mL/min   Anion gap 10 5 - 15    Comment: Performed at [facility], [address] [place], [place] [address]  Sedimentation rate     Status: Abnormal   Collection Time: 05/02/18  6:20 PM  Result Value Ref Range   Sed Rate 57 (H) 0 - 30 mm/hr    Comment: Performed at [facility], [address] [place], [place] [address]  CG4 I-STAT (Lactic acid)     Status: None   Collection Time: 05/02/18  6:37 PM  Result Value Ref Range   Lactic Acid, Venous 1.16 0.5 - 1.9 mmol/L   Dg Knee 1-2 Views Left  Result Date: 05/02/2018 CLINICAL DATA:  Left stump pain. EXAM: LEFT KNEE - 1-2 VIEW COMPARISON:  None. FINDINGS: Patient is status post below the knee amputation. No evidence for bony erosion or destruction at the level of amputation. There  appears to be a soft tissue gas medial to the tibial osteotomy site on the frontal film but this is not confirmed on the lateral projection and the soft tissue at the amputation site is redundant which could generate this appearance. IMPRESSION: Status post below-the-knee amputation with what appears to be soft tissue gas medial to the tibial osteotomy site however redundancy of the overlying soft tissues could cause this appearance. If there is clinical concern for soft tissue infection or osteomyelitis, MRI without and with contrast (renal function permitting) would be the study of choice to further evaluate. Electronically Signed   By: [name] Stanley M.D.   On: 05/02/2018 18:39    Pending Labs Unresulted Labs (From admission, onward)    Start     Ordered   05/02/18 1740  Blood culture (routine x 2)  BLOOD CULTURE X 2,   STAT    Question:  Patient immune status  Answer:  Normal   05/02/18 1739   05/02/18 1739  Aerobic/Anaerobic Culture (surgical/deep wound)  Once,   STAT    Question:  Patient immune status  Answer:  Normal   05/02/18 1739   Signed and Held  CBC  (heparin)  Once,   R    Comments:  Baseline for heparin therapy IF NOT ALREADY DRAWN.  Notify MD if PLT < 100 K.    Signed and Held   Signed and Held  Creatinine, serum  (heparin)  Once,   R  Comments:  Baseline for heparin therapy IF NOT ALREADY DRAWN.    Signed and Held   Signed and Held  CBC  Tomorrow morning,   R     Signed and Held   Signed and Held  Basic metabolic panel  Tomorrow morning,   R     Signed and Held          Vitals/Pain Today's Vitals   05/02/18 1732 05/02/18 2000  BP: (!) 197/70 (!) 198/87  Pulse:  72  Resp: 18   Temp: 98.3 F (36.8 C)   TempSrc: Oral   SpO2: 97% 95%  Weight: 47.2 kg   Height: 5' 4"  (1.626 m)   PainSc: 0-No pain     Isolation Precautions No active isolations  Medications Medications  piperacillin-tazobactam (ZOSYN) IVPB 3.375 g (has no administration in time range)   insulin aspart (novoLOG) injection 0-9 Units (has no administration in time range)  potassium chloride SA (K-DUR,KLOR-CON) CR tablet 40 mEq (has no administration in time range)  hydrALAZINE (APRESOLINE) injection 10 mg (has no administration in time range)  vancomycin (VANCOCIN) IVPB 500 mg/100 ml premix (has no administration in time range)  piperacillin-tazobactam (ZOSYN) IVPB 3.375 g (has no administration in time range)  vancomycin (VANCOCIN) IVPB 1000 mg/200 mL premix ( Intravenous Stopped 05/02/18 1943)

## 2018-05-02 NOTE — ED Provider Notes (Signed)
[facility] Emergency Department Provider Note       Time seen: ----------------------------------------- 5:53 PM on 05/02/2018 -----------------------------------------   I have reviewed the triage vital signs and the nursing notes.  HISTORY   Chief Complaint Wound Infection    HPI Stephanie Ellis is a 68 y.o. female with a history of anemia, vascular disease, hyperlipidemia, MI, diabetes, bilateral below the knee amputations who presents to the ED for possible left stump infection.  Patient has been sent by EMS from [facility] for same.  She had bilateral BKA performed in November, right stump has been healing well with the left covered in an absorbent pad with Kerlix.  There was noted to be odor from the wound.  Patient states that has been going on for several days.  She denies fevers, chills or other complaints.  Past Medical History:  Diagnosis Date  . (HFpEF) heart failure with preserved ejection fraction ([place])    a. 01/2018 Echo: EF 50-55%, no rwma, Gr1 DD, triv AI, mild MR. Midly dil LA/RV, mod reduced RV fxn. Irreg thickening of TV w/ mobile echodensity that appears to arise from valve.  . Anemia   . Anorexia   . Bacteremia due to Pseudomonas   . Carotid arterial disease ([place])    a. 01/2017 Carotid U/S: RICA <98, LICA <92.  Marland Kitchen Complication of anesthesia    a. Pt reports h/o complication on 9 different occasions - ? hypotension/arrest.  . 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 01/2015 Cath [facility]): 70% LM, p-mLAD 50-60% (Resting FFR 0.75), mRCA 80-90%, ~40 Ost OM & D1-->CABG; b. 01/2016 Staged PCI of LCX x 2 and RCA.  Marland Kitchen ESRD (end stage renal disease) on dialysis ([place])    a. ESRD secondary to acute kidney failure s/p CABG-->PD  . Essential hypertension   . GERD (gastroesophageal reflux disease)   . Heart murmur   . Hypercholesterolemia   . Myocardial infarction ([place]) 2016  . PVD (peripheral vascular disease)  ([place])    a. 01/30/2018 PV Angio: Sev Left Popliteal dzs s/p PTA w/ 78m lutonix DEB w/ mech aspiration of the L popliteal, L AT, and Left tibioperoneal trunck and peroneal artery.  . 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  [facility]: Dr. BMarland KitchenHaithcock: CABG x 3, LIMA to LAD, SVG to RCA, SVG to OM3, EVH  . Sinusitis 2019  . Type II diabetes mellitus with complication ([facility]    CAD    Patient Active Problem List   Diagnosis Date Noted  . BKA stump complication ([place] 111/94/1740 . Unilateral complete BKA, right, initial encounter ([place] 03/30/2018  . Transient loss of consciousness 03/19/2018  . Transient alteration of awareness   . VT (ventricular tachycardia) ([place]   . Disorientation   . Hypertensive urgency 03/08/2018  . Ischemia of extremity 02/18/2018  . ESRD (end stage renal disease) ([place]   . Diabetic osteomyelitis ([place]   . Peripheral vascular disease ([place]   . Advanced care planning/counseling discussion   . Palliative care by specialist   . Goals of care, counseling/discussion   . Diabetes mellitus, type 2 ([place] 01/30/2018  . Sepsis ([place] 01/29/2018  . Peripheral neuropathy 06/17/2017  . Encephalopathy 02/06/2017  . Hypertensive emergency 02/06/2017  . Endophthalmitis, left eye 12/05/2016  . Vomiting 10/24/2016  . Hematuria 10/17/2016  . Acute lower UTI 10/17/2016  . Anesthesia complication 081/44/8185 . Chest pain with moderate risk for cardiac etiology 09/04/2016  . Steal syndrome  dialysis vascular access, initial encounter ([place]) 06/18/2016  . 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 Coronary artery disease involving coronary bypass graft of native heart with angina pectoris ([place]) 02/10/2016  . Hypertension 02/02/2016  . Hyperlipidemia 02/02/2016  . Coronary artery disease involving left main coronary artery 10/04/2015  . Abdominal pain of unknown etiology 10/04/2015  . ESRD (end stage renal disease) on dialysis ([place])   . Type II diabetes mellitus with  complication ([place])   . NSTEMI (non-ST elevated myocardial infarction) ([place])   . Right sided abdominal pain   . Elevated troponin 10/03/2015  . 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 Chronic diastolic congestive heart failure ([place]) 04/19/2014  . Stage 5 chronic kidney disease on chronic dialysis ([place]) 04/19/2014  . Anemia 09/20/2013  . Routine health maintenance 01/29/2013    Past Surgical History:  Procedure Laterality Date  . ABDOMINAL HYSTERECTOMY    . AMPUTATION Right 02/25/2018   Procedure: AMPUTATION BELOW KNEE;  Surgeon: Katha Cabal, MD;  Location: [facility] ORS;  Service: Vascular;  Laterality: Right;  . AMPUTATION Left 03/11/2018   Procedure: AMPUTATION BELOW KNEE;  Surgeon: Algernon Huxley, MD;  Location: [facility] ORS;  Service: Vascular;  Laterality: Left;  . AMPUTATION Bilateral 04/13/2018   Procedure: REVISION OF BILATERAL AMPUTATIONS BELOW KNEE WITH WOUND VAC APPLICATIONS;  Surgeon: Evaristo Bury, MD;  Location: [facility] ORS;  Service: Vascular;  Laterality: Bilateral;  . AMPUTATION TOE Left 02/06/2018   Procedure: AMPUTATION GREAT TOE;  Surgeon: Samara Deist, DPM;  Location: [facility] ORS;  Service: Podiatry;  Laterality: Left;  . APPLICATION OF WOUND VAC Left 04/13/2018   Procedure: APPLICATION OF WOUND VAC;  Surgeon: Evaristo Bury, MD;  Location: [facility] ORS;  Service: Vascular;  Laterality: Left;  . ARTERIOVENOUS GRAFT PLACEMENT  05/2016  . AV FISTULA PLACEMENT Left 05/30/2016   Procedure: ARTERIOVENOUS graft;  Surgeon: Algernon Huxley, MD;  Location: [facility] ORS;  Service: Vascular;  Laterality: Left;  . CAPD INSERTION N/A 10/30/2017   Procedure: LAPAROSCOPIC INSERTION CONTINUOUS AMBULATORY PERITONEAL DIALYSIS  (CAPD) CATHETER;  Surgeon: Algernon Huxley, MD;  Location: [facility] ORS;  Service: Vascular;  Laterality: N/A;  . CARDIAC CATHETERIZATION  01/2015   [facility]: Ost LM 70%, p-m LAD 50-60% (Rest FFR + @ 0.75), mRCA 80-90%, ostD1 40%, pOM1 40%  . CATARACT EXTRACTION W/PHACO Left 01/18/2016   Procedure:  CATARACT EXTRACTION PHACO AND INTRAOCULAR LENS PLACEMENT (IOC);  Surgeon: Eulogio Bear, MD;  Location: [facility] ORS;  Service: Ophthalmology;  Laterality: Left;  [place] 1.05 AP% 15.5 CDE 10.16 Fluid Pack Lot # [id number]  . CATARACT EXTRACTION W/PHACO Right 08/01/2016   Procedure: CATARACT EXTRACTION PHACO AND INTRAOCULAR LENS PLACEMENT (IOC);  Surgeon: Eulogio Bear, MD;  Location: [facility] ORS;  Service: Ophthalmology;  Laterality: Right;  [place] 01:00.6 AP% 11.4 CDE 6.93  LOT # [id number]  . COLONOSCOPY    . CORONARY ANGIOPLASTY     SENTS 02/12/16  . CORONARY ARTERY BYPASS GRAFT  03/28/15    [facility]: Dr. Waldemar Dickens: LIMA to LAD, SVG to RCA, SVG to OM3, EVH  . DIALYSIS/PERMA CATHETER INSERTION N/A 02/11/2018   Procedure: DIALYSIS/PERMA CATHETER INSERTION;  Surgeon: Algernon Huxley, MD;  Location: [place] CV LAB;  Service: Cardiovascular;  Laterality: N/A;  . DIALYSIS/PERMA CATHETER REMOVAL Right 02/04/2018   Procedure: DIALYSIS/PERMA CATHETER REMOVAL;  Surgeon: Katha Cabal, MD;  Location: [place] CV LAB;  Service: Cardiovascular;  Laterality: Right;  . ESOPHAGOGASTRODUODENOSCOPY (EGD) WITH PROPOFOL N/A 10/24/2016   Procedure:  ESOPHAGOGASTRODUODENOSCOPY (EGD) WITH PROPOFOL;  Surgeon: [name], MD;  Location: [facility] ENDOSCOPY;  Service: Endoscopy;  Laterality: N/A;  . EYE SURGERY Bilateral    cataract surgery  . EYE SURGERY     drains for glaucoma  . INSERTION EXPRESS TUBE SHUNT Right 08/01/2016   Procedure: INSERTION EXPRESS TUBE SHUNT;  Surgeon: Eulogio Bear, MD;  Location: [facility] OR[facility];  Service: Ophthalmology;  Laterality: Right;  . INSERTION OF AHMED VALVE Left 08/15/2016   Procedure: INSERTION OF AHMED VALVE;  Surgeon: Eulogio Bear, MD;  Location: [facility] OR[facility];  Service: Ophthalmology;  Laterality: Left;  . INSERTION OF DIALYSIS CATHETER    . LOWER EXTREMITY ANGIOGRAPHY Right 02/19/2018   Procedure: Lower Extremity Angiography;  Surgeon: Algernon Huxley, MD;  Location: [place]  CV LAB;  Service: Cardiovascular;  Laterality: Right;  . LOWER EXTREMITY ANGIOGRAPHY Left 01/30/2018   Procedure: Lower Extremity Angiography;  Surgeon: Katha Cabal, MD;  Location: [place] CV LAB;  Service: Cardiovascular;  Laterality: Left;  . REMOVAL OF A DIALYSIS CATHETER N/A 02/25/2018   Procedure: REMOVAL OF A DIALYSIS CATHETER;  Surgeon: Katha Cabal, MD;  Location: [facility] ORS;  Service: Vascular;  Laterality: N/A;  . TEMPORARY DIALYSIS CATHETER N/A 02/06/2018   Procedure: TEMPORARY DIALYSIS CATHETER;  Surgeon: Katha Cabal, MD;  Location: [place] CV LAB;  Service: Cardiovascular;  Laterality: N/A;  . TRANSTHORACIC ECHOCARDIOGRAM  January 2017    EF 60-65%. GR 2 DD. Mild degenerative mitral valve disease but no prolapse or regurgitation. Mild left atrial dilation. Mild to moderate LVH. Pericardial effusion gone  . UPPER EXTREMITY ANGIOGRAPHY Left 06/27/2016   Procedure: Upper Extremity Angiography;  Surgeon: Algernon Huxley, MD;  Location: [place] CV LAB;  Service: Cardiovascular;  Laterality: Left;  . UPPER EXTREMITY ANGIOGRAPHY Left 08/22/2016   Procedure: Upper Extremity Angiography;  Surgeon: Algernon Huxley, MD;  Location: [place] CV LAB;  Service: Cardiovascular;  Laterality: Left;  Marland Kitchen VASCULAR SURGERY      Allergies Chlorthalidone; Fentanyl; Midazolam; Ace inhibitors; Angiotensin receptor blockers; Norvasc [amlodipine]; and Phenergan [promethazine hcl]  Social History Social History   Tobacco Use  . Smoking status: Never Smoker  . Smokeless tobacco: Never Used  Substance Use Topics  . Alcohol use: No  . Drug use: No   Review of Systems Constitutional: Negative for fever. Cardiovascular: Negative for chest pain. Respiratory: Negative for shortness of breath. Gastrointestinal: Negative for abdominal pain, vomiting and diarrhea. Musculoskeletal: Positive for left stump pain Skin: Positive for drainage from the left stump Neurological: Negative  for headaches, focal weakness or numbness.  All systems negative/normal/unremarkable except as stated in the HPI  ____________________________________________   PHYSICAL EXAM:  VITAL SIGNS: ED Triage Vitals [05/02/18 1732]  Enc Vitals Group     BP (!) 197/70     Pulse      Resp 18     Temp 98.3 F (36.8 C)     Temp Source Oral     SpO2 97 %     Weight 104 lb (47.2 kg)     Height 5' 4"  (1.626 m)     Head Circumference      Peak Flow      Pain Score 0     Pain Loc      Pain Edu?      Excl. in [place]?    Constitutional: Alert and oriented.  Chronically ill-appearing, no distress Cardiovascular: Normal rate, regular rhythm. No murmurs, rubs, or gallops. Respiratory: Normal respiratory  effort without tachypnea nor retractions. Breath sounds are clear and equal bilaterally. No wheezes/rales/rhonchi. Gastrointestinal: Soft and nontender. Normal bowel sounds Musculoskeletal: Left stump is tender, there has been wound dehiscence or wound breakdown along the anterior aspect of her stump.  Wound has been packed with Kerlix.  There is a foul odor present, no obvious purulent drainage Neurologic:  Normal speech and language. No gross focal neurologic deficits are appreciated.  Skin: Wound breakdown and skin ulceration over the anterior left below the knee amputation stump, foul odor but no obvious drainage, granulation tissue is present Psychiatric: Mood and affect are normal. Speech and behavior are normal.   ____________________________________________  ED COURSE:  As part of my medical decision making, I reviewed the following data within the [place] History obtained from family if available, nursing notes, old chart and ekg, as well as notes from prior ED visits. Patient presented for possible left stump infection, we will assess with labs and imaging as indicated at this time.   Procedures ____________________________________________   LABS (pertinent  positives/negatives)  Labs Reviewed  CBC WITH DIFFERENTIAL/PLATELET - Abnormal; Notable for the following components:      Result Value   Hemoglobin 11.4 (*)    MCHC 29.7 (*)    RDW 19.1 (*)    All other components within normal limits  COMPREHENSIVE METABOLIC PANEL - Abnormal; Notable for the following components:   Potassium 2.7 (*)    BUN 6 (*)    Creatinine, Ser 2.05 (*)    Albumin 3.0 (*)    GFR calc non Af Amer 24 (*)    GFR calc Af Amer 28 (*)    All other components within normal limits  SEDIMENTATION RATE - Abnormal; Notable for the following components:   Sed Rate 57 (*)    All other components within normal limits  AEROBIC/ANAEROBIC CULTURE (SURGICAL/DEEP WOUND)  CULTURE, BLOOD (ROUTINE X 2)  CULTURE, BLOOD (ROUTINE X 2)  I-STAT CG4 LACTIC ACID, ED  CG4 I-STAT (LACTIC ACID)    RADIOLOGY Images were viewed by me  Left tib-fib x-rays  IMPRESSION: Status post below-the-knee amputation with what appears to be soft tissue gas medial to the tibial osteotomy site however redundancy of the overlying soft tissues could cause this appearance. If there is clinical concern for soft tissue infection or osteomyelitis, MRI without and with contrast (renal function permitting) would be the study of choice to further evaluate. MRI is pending ____________________________________________   DIFFERENTIAL DIAGNOSIS   Cellulitis, osteomyelitis, wound infection  FINAL ASSESSMENT AND PLAN  Wound infection, possible osteomyelitis, hypokalemia   Plan: The patient had presented for possible left BKA stump infection. Patient's labs regarding her CBC and chemistries revealed hypokalemia and an elevated sed rate but normal CBC. Patient's imaging concerning for possible gas around the soft tissues.  I have ordered an MRI.  Patient was ordered IV broad-spectrum antibiotics to cover for osteomyelitis.  We have ordered IV half-normal saline with 40 meq of potassium.  I will discuss with  hospitalist for admission.   Laurence Aly, MD    Note: This note was generated in part or whole with voice recognition software. Voice recognition is usually quite accurate but there are transcription errors that can and very often do occur. I apologize for any typographical errors that were not detected and corrected.     Earleen Newport, MD 05/02/18 [phone]

## 2018-05-03 ENCOUNTER — Inpatient Hospital Stay: Payer: Medicare Other

## 2018-05-03 LAB — BASIC METABOLIC PANEL
Anion gap: 10 (ref 5–15)
BUN: 10 mg/dL (ref 8–23)
CO2: 27 mmol/L (ref 22–32)
Calcium: 9.5 mg/dL (ref 8.9–10.3)
Chloride: 99 mmol/L (ref 98–111)
Creatinine, Ser: 2.93 mg/dL — ABNORMAL HIGH (ref 0.44–1.00)
GFR calc Af Amer: 18 mL/min — ABNORMAL LOW (ref >60)
GFR calc non Af Amer: 16 mL/min — ABNORMAL LOW (ref >60)
Glucose, Bld: 150 mg/dL — ABNORMAL HIGH (ref 70–99)
Potassium: 2.8 mmol/L — ABNORMAL LOW (ref 3.5–5.1)
Sodium: 136 mmol/L (ref 135–145)

## 2018-05-03 LAB — GLUCOSE, CAPILLARY
Glucose-Capillary: 129 mg/dL — ABNORMAL HIGH (ref 70–99)
Glucose-Capillary: 148 mg/dL — ABNORMAL HIGH (ref 70–99)
Glucose-Capillary: 160 mg/dL — ABNORMAL HIGH (ref 70–99)
Glucose-Capillary: 81 mg/dL (ref 70–99)

## 2018-05-03 LAB — CBC
HCT: 38.7 % (ref 36.0–46.0)
Hemoglobin: 11.4 g/dL — ABNORMAL LOW (ref 12.0–15.0)
MCH: 28.3 pg (ref 26.0–34.0)
MCHC: 29.5 g/dL — ABNORMAL LOW (ref 30.0–36.0)
MCV: 96 fL (ref 80.0–100.0)
Platelets: 307 10*3/uL (ref 150–400)
RBC: 4.03 MIL/uL (ref 3.87–5.11)
RDW: 19 % — ABNORMAL HIGH (ref 11.5–15.5)
WBC: 10.1 10*3/uL (ref 4.0–10.5)
nRBC: 0 % (ref 0.0–0.2)

## 2018-05-03 LAB — MRSA PCR SCREENING: MRSA by PCR: NEGATIVE

## 2018-05-03 MED ORDER — DAKINS (1/4 STRENGTH) 0.125 % EX SOLN
Freq: Two times a day (BID) | CUTANEOUS | Status: AC
  Administered 2018-05-03 – 2018-05-05 (×3)
  Filled 2018-05-03 (×2): qty 473

## 2018-05-03 NOTE — Clinical Social Work Note (Signed)
CSW is aware via chart review that patient admitted from [facility]. CSW will assess when able.  Stephanie Ellis, MSW, Stephanie Ellis [phone]

## 2018-05-03 NOTE — Clinical Social Work Note (Signed)
Clinical Social Work Assessment  Patient Details  Name: Stephanie Ellis MRN: [id number] Date of Birth: Jan 01, 1951  Date of referral:  05/03/18               Reason for consult:  Care Management Concerns, Facility Placement, Discharge Planning                Permission sought to share information with:  Case Manager, Facility Sport and exercise psychologist, Family Supports Permission granted to share information::  Yes, Verbal Permission Granted  Name::     Stephanie Ellis Daughter [phone]   Agency::  [facility]  Relationship::  Ellis,Stephanie Daughter [phone]   Contact Information:  Stephanie Ellis Daughter [phone]   Housing/Transportation Living arrangements for the past 2 months:  [place] of Information:  Patient, Adult Children Patient Interpreter Needed:  None Criminal Activity/Legal Involvement Pertinent to Current Situation/Hospitalization:  No - Comment as needed Significant Relationships:  Adult Children Lives with:  Facility Resident Do you feel safe going back to the place where you live?  Yes Need for family participation in patient care:  No (Coment)  Care giving concerns: Pt currently at [facility].  Family plan is for pt to return to [facility] after rehab.  Family reserving bed at [facility]. Worried about cost.    Facilities manager / plan:  CSW met with pt at bedside.  CSW got consent to talk to daughter Stephanie Ellis.  CSW spoke to daughter regarding her mother's discharge plan.  Patient currently receives dialysis TTS and is at SNF short term Rehab.  Plans on returning to [facility].   Employment status:  Disabled (Comment on whether or not currently receiving Disability) Insurance information:  Medicare PT Recommendations:  [place] / Referral to community resources:  [place]  Patient/Family's Response to care:  Patient needs family involvement.  She has poor insight and family  involvement is recommended.   Patient/Family's Understanding of and Emotional Response to Diagnosis, Current Treatment, and Prognosis:  Pt and daughter is aware of discharge plan and agrees to her mother being discharge to SNF.   Emotional Assessment Appearance:  Appears older than stated age Attitude/Demeanor/Rapport:  Engaged Affect (typically observed):  Calm Orientation:  Oriented to Self, Oriented to Place, Oriented to  Time, Oriented to Situation Alcohol / Substance use:  Not Applicable Psych involvement (Current and /or in the community):  No (Comment)  Discharge Needs  Concerns to be addressed:  Discharge Planning Concerns Readmission within the last 30 days:  Yes Current discharge risk:  Chronically ill, Physical Impairment, Dependent with Mobility Barriers to Discharge:  Continued Medical Work up   [employer], LCSW 05/03/2018, 4:31 PM

## 2018-05-03 NOTE — Consult Note (Signed)
[facility] Vascular Consult Note  MRN : [id number]  Stephanie Ellis is a 68 y.o. (04-28-1950) female who presents with chief complaint of left BKA stump drainage.   Chief Complaint  Patient presents with  . Wound Infection  .  History of Present Illness: 68 yo female with a history of CAD s/p CABG, DM, ESRD, and PAD s/p b/l BKA admitted for left BKA stump drainage.  Patient was admitted in December after falling on her right stump.  Her left stump was noted to be necrotic at that time.   She was taken for bilateral BKA debridement.  Today her right stump appears clean and wound is intact.  The left stump is an open wound that was being managed with a woundVAC.  Given the foul smell and drainage she was sent to the ED.  She denies fever, chills, night sweats.  She admits to left stump pain.    Current Facility-Administered Medications  Medication Dose Route Frequency Provider Last Rate Last Dose  . 0.9 %  sodium chloride infusion  250 mL Intravenous PRN Dustin Flock, MD      . acetaminophen (TYLENOL) tablet 650 mg  650 mg Oral Q6H PRN Dustin Flock, MD      . apixaban Arne Cleveland) tablet 2.5 mg  2.5 mg Oral BID Dustin Flock, MD   2.5 mg at 05/03/18 1032  . aspirin EC tablet 81 mg  81 mg Oral Daily Dustin Flock, MD   81 mg at 05/03/18 1031  . atorvastatin (LIPITOR) tablet 80 mg  80 mg Oral QHS Dustin Flock, MD      . brimonidine (ALPHAGAN) 0.2 % ophthalmic solution 1 drop  1 drop Both Eyes TID Dustin Flock, MD   1 drop at 05/03/18 1035  . calcitRIOL (ROCALTROL) capsule 0.5 mcg  0.5 mcg Oral Daily Dustin Flock, MD   0.5 mcg at 05/03/18 1035  . carvedilol (COREG) tablet 6.25 mg  6.25 mg Oral BID WC Dustin Flock, MD   6.25 mg at 05/03/18 0800  . docusate sodium (COLACE) capsule 100 mg  100 mg Oral BID Dustin Flock, MD   100 mg at 05/03/18 1031  . famotidine (PEPCID) tablet 20 mg  20 mg Oral BID Dustin Flock, MD   20 mg at 05/03/18 1031  . folic  acid (FOLVITE) tablet 1 mg  1 mg Oral Daily Dustin Flock, MD   1 mg at 05/03/18 1031  . hydrALAZINE (APRESOLINE) injection 10 mg  10 mg Intravenous Q6H PRN Dustin Flock, MD   10 mg at 05/02/18 2057  . insulin aspart (novoLOG) injection 0-9 Units  0-9 Units Subcutaneous TID WC Dustin Flock, MD   1 Units at 05/03/18 0801  . irbesartan (AVAPRO) tablet 37.5 mg  37.5 mg Oral Daily Dustin Flock, MD   37.5 mg at 05/03/18 1030  . isosorbide mononitrate (IMDUR) 24 hr tablet 30 mg  30 mg Oral Daily Dustin Flock, MD   30 mg at 05/03/18 1035  . [START ON 05/04/2018] levETIRAcetam (KEPPRA) tablet 250 mg  250 mg Oral Once per day on Mon Wed Fri Patel, Shreyang, MD      . levETIRAcetam (KEPPRA) tablet 500 mg  500 mg Oral BID Dustin Flock, MD   500 mg at 05/03/18 1035  . [START ON 05/04/2018] midodrine (PROAMATINE) tablet 10 mg  10 mg Oral Once per day on Mon Wed Fri Patel, Shreyang, MD      . mirtazapine (REMERON) tablet 7.5 mg  7.5 mg  Oral QHS Dustin Flock, MD      . multivitamin with minerals tablet 1 tablet  1 tablet Oral Daily Dustin Flock, MD      . multivitamin with minerals tablet 2 tablet  2 tablet Oral Daily Dustin Flock, MD   2 tablet at 05/03/18 1030  . nitroGLYCERIN (NITROSTAT) SL tablet 0.4 mg  0.4 mg Sublingual Q5 min PRN Dustin Flock, MD      . omega-3 acid ethyl esters (LOVAZA) capsule 2 g  2 capsule Oral Daily Dustin Flock, MD   2 g at 05/03/18 1031  . ondansetron (ZOFRAN) tablet 4 mg  4 mg Oral Q6H PRN Dustin Flock, MD       Or  . ondansetron (ZOFRAN) injection 4 mg  4 mg Intravenous Q6H PRN Dustin Flock, MD      . ondansetron (ZOFRAN) tablet 4 mg  4 mg Oral Q8H PRN Dustin Flock, MD      . piperacillin-tazobactam (ZOSYN) IVPB 3.375 g  3.375 g Intravenous Q12H Dustin Flock, MD 12.5 mL/hr at 05/03/18 1030 3.375 g at 05/03/18 1030  . polyvinyl alcohol (LIQUIFILM TEARS) 1.4 % ophthalmic solution 1 drop  1 drop Both Eyes BID Dustin Flock, MD   1 drop at  05/03/18 1036  . potassium chloride SA (K-DUR,KLOR-CON) CR tablet 40 mEq  40 mEq Oral Once Dustin Flock, MD      . rOPINIRole (REQUIP) tablet 0.25 mg  0.25 mg Oral QHS Dustin Flock, MD      . sevelamer carbonate (RENVELA) tablet 800 mg  800 mg Oral TID WC Dustin Flock, MD   800 mg at 05/03/18 0801  . sodium chloride flush (NS) 0.9 % injection 3 mL  3 mL Intravenous Q12H Dustin Flock, MD   3 mL at 05/03/18 1036  . sodium chloride flush (NS) 0.9 % injection 3 mL  3 mL Intravenous PRN Dustin Flock, MD      . timolol (TIMOPTIC) 0.5 % ophthalmic solution 1 drop  1 drop Both Eyes TID Dustin Flock, MD   1 drop at 05/03/18 1036  . traMADol (ULTRAM) tablet 50 mg  50 mg Oral Q8H PRN Dustin Flock, MD      . Derrill Memo ON 05/05/2018] vancomycin (VANCOCIN) IVPB 500 mg/100 ml premix  500 mg Intravenous Q T,Th,Sa-HD Dustin Flock, MD      . vitamin B-12 (CYANOCOBALAMIN) tablet 1,000 mcg  1,000 mcg Oral Daily Dustin Flock, MD   1,000 mcg at 05/03/18 1031    Past Medical History:  Diagnosis Date  . (HFpEF) heart failure with preserved ejection fraction ([place])    a. 01/2018 Echo: EF 50-55%, no rwma, Gr1 DD, triv AI, mild MR. Midly dil LA/RV, mod reduced RV fxn. Irreg thickening of TV w/ mobile echodensity that appears to arise from valve.  . Anemia   . Anorexia   . Bacteremia due to Pseudomonas   . Carotid arterial disease ([place])    a. 01/2017 Carotid U/S: RICA <16, LICA <38.  Marland Kitchen Complication of anesthesia    a. Pt reports h/o complication on 9 different occasions - ? hypotension/arrest.  . 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 01/2015 Cath [facility]): 70% LM, p-mLAD 50-60% (Resting FFR 0.75), mRCA 80-90%, ~40 Ost OM & D1-->CABG; b. 01/2016 Staged PCI of LCX x 2 and RCA.  Marland Kitchen ESRD (end stage renal disease) on dialysis ([place])    a. ESRD secondary to acute kidney failure s/p CABG-->PD  . Essential hypertension   .  GERD (gastroesophageal reflux disease)   . Heart  murmur   . Hypercholesterolemia   . Myocardial infarction ([place]) 2016  . PVD (peripheral vascular disease) ([place])    a. 01/30/2018 PV Angio: Sev Left Popliteal dzs s/p PTA w/ 54m lutonix DEB w/ mech aspiration of the L popliteal, L AT, and Left tibioperoneal trunck and peroneal artery.  . 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  [facility]: Dr. BMarland KitchenHaithcock: CABG x 3, LIMA to LAD, SVG to RCA, SVG to OM3, EVH  . Sinusitis 2019  . Type II diabetes mellitus with complication (H[facility])    CAD    Past Surgical History:  Procedure Laterality Date  . ABDOMINAL HYSTERECTOMY    . AMPUTATION Right 02/25/2018   Procedure: AMPUTATION BELOW KNEE;  Surgeon: SKatha Cabal MD;  Location: [facility] ORS;  Service: Vascular;  Laterality: Right;  . AMPUTATION Left 03/11/2018   Procedure: AMPUTATION BELOW KNEE;  Surgeon: DAlgernon Huxley MD;  Location: [facility] ORS;  Service: Vascular;  Laterality: Left;  . AMPUTATION Bilateral 04/13/2018   Procedure: REVISION OF BILATERAL AMPUTATIONS BELOW KNEE WITH WOUND VAC APPLICATIONS;  Surgeon: EEvaristo Bury MD;  Location: [facility] OR[facility];  Service: Vascular;  Laterality: Bilateral;  . AMPUTATION TOE Left 02/06/2018   Procedure: AMPUTATION GREAT TOE;  Surgeon: FSamara Deist DPM;  Location: [facility] ORS;  Service: Podiatry;  Laterality: Left;  . APPLICATION OF WOUND VAC Left 04/13/2018   Procedure: APPLICATION OF WOUND VAC;  Surgeon: EEvaristo Bury MD;  Location: [facility] OR[facility];  Service: Vascular;  Laterality: Left;  . ARTERIOVENOUS GRAFT PLACEMENT  05/2016  . AV FISTULA PLACEMENT Left 05/30/2016   Procedure: ARTERIOVENOUS graft;  Surgeon: JAlgernon Huxley MD;  Location: [facility];  Service: Vascular;  Laterality: Left;  . CAPD INSERTION N/A 10/30/2017   Procedure: LAPAROSCOPIC INSERTION CONTINUOUS AMBULATORY PERITONEAL DIALYSIS  (CAPD) CATHETER;  Surgeon: DAlgernon Huxley MD;  Location: [facility];  Service: Vascular;  Laterality: N/A;  . CARDIAC CATHETERIZATION  01/2015   [facility]: Ost LM 70%, p-m LAD 50-60% (Rest  FFR + @ 0.75), mRCA 80-90%, ostD1 40%, pOM1 40%  . CATARACT EXTRACTION W/PHACO Left 01/18/2016   Procedure: CATARACT EXTRACTION PHACO AND INTRAOCULAR LENS PLACEMENT (IOC);  Surgeon: BEulogio Bear MD;  Location: [facility];  Service: Ophthalmology;  Laterality: Left;  [place]1.05 AP% 15.5 CDE 10.16 Fluid Pack Lot # [id number]  . CATARACT EXTRACTION W/PHACO Right 08/01/2016   Procedure: CATARACT EXTRACTION PHACO AND INTRAOCULAR LENS PLACEMENT (IOC);  Surgeon: BEulogio Bear MD;  Location: [facility];  Service: Ophthalmology;  Laterality: Right;  [place]01:00.6 AP% 11.4 CDE 6.93  LOT # [id number]  . COLONOSCOPY    . CORONARY ANGIOPLASTY     SENTS 02/12/16  . CORONARY ARTERY BYPASS GRAFT  03/28/15    [facility]: Dr. HWaldemar Dickens LIMA to LAD, SVG to RCA, SVG to OM3, EVH  . DIALYSIS/PERMA CATHETER INSERTION N/A 02/11/2018   Procedure: DIALYSIS/PERMA CATHETER INSERTION;  Surgeon: DAlgernon Huxley MD;  [place] [facility];  Service: Cardiovascular;  Laterality: N/A;  . DIALYSIS/PERMA CATHETER REMOVAL Right 02/04/2018   Procedure: DIALYSIS/PERMA CATHETER REMOVAL;  Surgeon: SKatha Cabal MD;  [place] [facility];  Service: Cardiovascular;  Laterality: Right;  . ESOPHAGOGASTRODUODENOSCOPY (EGD) WITH PROPOFOL N/A 10/24/2016   Procedure: ESOPHAGOGASTRODUODENOSCOPY (EGD) WITH PROPOFOL;  Surgeon: WLucilla Lame MD;  Location: [facility];  Service: Endoscopy;  Laterality: N/A;  . EYE SURGERY Bilateral    cataract surgery  . EYE SURGERY  drains for glaucoma  . INSERTION EXPRESS TUBE SHUNT Right 08/01/2016   Procedure: INSERTION EXPRESS TUBE SHUNT;  Surgeon: Eulogio Bear, MD;  Location: [facility];  Service: Ophthalmology;  Laterality: Right;  . INSERTION OF AHMED VALVE Left 08/15/2016   Procedure: INSERTION OF AHMED VALVE;  Surgeon: Eulogio Bear, MD;  Location: [facility];  Service: Ophthalmology;  Laterality: Left;  . INSERTION OF DIALYSIS CATHETER    . LOWER EXTREMITY ANGIOGRAPHY  Right 02/19/2018   Procedure: Lower Extremity Angiography;  Surgeon: Algernon Huxley, MD;  Location: [place] CV LAB;  Service: Cardiovascular;  Laterality: Right;  . LOWER EXTREMITY ANGIOGRAPHY Left 01/30/2018   Procedure: Lower Extremity Angiography;  Surgeon: Katha Cabal, MD;  Location: [place] CV LAB;  Service: Cardiovascular;  Laterality: Left;  . REMOVAL OF A DIALYSIS CATHETER N/A 02/25/2018   Procedure: REMOVAL OF A DIALYSIS CATHETER;  Surgeon: Katha Cabal, MD;  Location: [facility];  Service: Vascular;  Laterality: N/A;  . TEMPORARY DIALYSIS CATHETER N/A 02/06/2018   Procedure: TEMPORARY DIALYSIS CATHETER;  Surgeon: Katha Cabal, MD;  Location: [place] [facility] LAB;  Service: Cardiovascular;  Laterality: N/A;  . TRANSTHORACIC ECHOCARDIOGRAM  January 2017    EF 60-65%. GR 2 DD. Mild degenerative mitral valve disease but no prolapse or regurgitation. Mild left atrial dilation. Mild to moderate LVH. Pericardial effusion gone  . UPPER EXTREMITY ANGIOGRAPHY Left 06/27/2016   Procedure: Upper Extremity Angiography;  Surgeon: Algernon Huxley, MD;  Location: [place] [facility];  Service: Cardiovascular;  Laterality: Left;  . UPPER EXTREMITY ANGIOGRAPHY Left 08/22/2016   Procedure: Upper Extremity Angiography;  Surgeon: Algernon Huxley, MD;  Location: [place] CV LAB;  Service: Cardiovascular;  Laterality: Left;  Marland Kitchen VASCULAR SURGERY      Social History Social History   Tobacco Use  . Smoking status: Never Smoker  . Smokeless tobacco: Never Used  Substance Use Topics  . Alcohol use: No  . Drug use: No    Family History Family History  Problem Relation Age of Onset  . Diabetes Mellitus II Mother   . Heart failure Mother   . Pancreatic cancer Father     Allergies  Allergen Reactions  . Chlorthalidone Anaphylaxis, Itching and Rash  . Fentanyl Rash  . Midazolam Rash  . Ace Inhibitors Other (See Comments)    Reaction:  Hyperkalemia, agitation   . Angiotensin  Receptor Blockers Other (See Comments)    Reaction:  Hyperkalemia, agitation   . Norvasc [Amlodipine] Itching and Rash  . Phenergan [Promethazine Hcl] Anxiety    "antsy, can't sit still"     REVIEW OF SYSTEMS (Negative unless checked)  Constitutional: [] Weight loss  [] Fever  [] Chills Cardiac: [] Chest pain   [] Chest pressure   [] Palpitations   [] Shortness of breath when laying flat   [] Shortness of breath at rest   [] Shortness of breath with exertion. Vascular:  [] Pain in legs with walking   [] Pain in legs at rest   [] Pain in legs when laying flat   [] Claudication   [] Pain in feet when walking  [] Pain in feet at rest  [] Pain in feet when laying flat   [] History of DVT   [] Phlebitis   [] Swelling in legs   [] Varicose veins   [] Non-healing ulcers Pulmonary:   [] Uses home oxygen   [] Productive cough   [] Hemoptysis   [] Wheeze  [] COPD   [] Asthma Neurologic:  [] Dizziness  [] Blackouts   [] Seizures   [] History of stroke   [] History  of TIA  [] Aphasia   [] Temporary blindness   [] Dysphagia   [] Weakness or numbness in arms   [] Weakness or numbness in legs Musculoskeletal:  [] Arthritis   [] Joint swelling   [] Joint pain   [] Low back pain Hematologic:  [] Easy bruising  [] Easy bleeding   [] Hypercoagulable state   [] Anemic  [] Hepatitis Gastrointestinal:  [] Blood in stool   [] Vomiting blood  [] Gastroesophageal reflux/heartburn   [] Difficulty swallowing. Genitourinary:  [] Chronic kidney disease   [] Difficult urination  [] Frequent urination  [] Burning with urination   [] Blood in urine Skin:  [] Rashes   [] Ulcers   [x] Wounds Psychological:  [] History of anxiety   []  History of major depression.  Physical Examination  Vitals:   05/02/18 2000 05/02/18 2057 05/02/18 2132 05/03/18 0729  BP: (!) 198/87 (!) 212/101 (!) 150/77 (!) 157/56  Pulse: 72  74 69  Resp:   18   Temp:   98.8 F (37.1 C) 98.7 F (37.1 C)  TempSrc:   Oral Oral  SpO2: 95%  100% 96%  Weight:      Height:       Body mass index is 17.85  kg/m. Gen:  WD/WN, NAD Head: /AT, No temporalis wasting. Prominent temp pulse not noted. Ear/Nose/Throat: Hearing grossly intact, nares w/o erythema or drainage, oropharynx w/o Erythema/Exudate Eyes: Sclera non-icteric, conjunctiva clear Neck: Trachea midline.  No JVD.  Pulmonary:  Good air movement, respirations not labored, equal bilaterally.  Cardiac: RRR, normal S1, S2. Vascular:  Bilateral BKA.  Right stump wound closed, c/di; Left stump open wound, foul smelling, fibrinous exudate, no fluctuance or drainage noted Vessel Right Left  Femoral Palpable Palpable   Gastrointestinal: soft, non-tender/non-distended. No guarding/reflex.  Musculoskeletal: M/S 5/5 throughout.  Extremities without ischemic changes.  No deformity or atrophy. No edema. Neurologic: Sensation grossly intact in extremities.  Symmetrical.  Speech is fluent. Motor exam as listed above. Psychiatric: Judgment intact, Mood & affect appropriate for pt's clinical situation. Dermatologic: No rashes or ulcers noted.  No cellulitis or open wounds. Lymph : No Cervical, Axillary, or Inguinal lymphadenopathy.      CBC Lab Results  Component Value Date   WBC 10.1 05/03/2018   HGB 11.4 (L) 05/03/2018   HCT 38.7 05/03/2018   MCV 96.0 05/03/2018   PLT 307 05/03/2018    BMET    Component Value Date/Time   NA 136 05/03/2018 0437   K 2.8 (L) 05/03/2018 0437   CL 99 05/03/2018 0437   CO2 27 05/03/2018 0437   GLUCOSE 150 (H) 05/03/2018 0437   BUN 10 05/03/2018 0437   CREATININE 2.93 (H) 05/03/2018 0437   CALCIUM 9.5 05/03/2018 0437   GFRNONAA 16 (L) 05/03/2018 0437   GFRAA 18 (L) 05/03/2018 0437   Estimated Creatinine Clearance: 13.9 mL/min (A) (by C-G formula based on SCr of 2.93 mg/dL (H)).  COAG Lab Results  Component Value Date   INR 1.25 03/11/2018   INR 1.13 02/25/2018   INR 1.27 02/18/2018    Radiology Dg Knee 1-2 Views Left  Result Date: 05/02/2018 CLINICAL DATA:  Left stump pain. EXAM: LEFT  KNEE - 1-2 VIEW COMPARISON:  None. FINDINGS: Patient is status post below the knee amputation. No evidence for bony erosion or destruction at the level of amputation. There appears to be a soft tissue gas medial to the tibial osteotomy site on the frontal film but this is not confirmed on the lateral projection and the soft tissue at the amputation site is redundant which could generate this appearance. IMPRESSION:  Status post below-the-knee amputation with what appears to be soft tissue gas medial to the tibial osteotomy site however redundancy of the overlying soft tissues could cause this appearance. If there is clinical concern for soft tissue infection or osteomyelitis, MRI without and with contrast (renal function permitting) would be the study of choice to further evaluate. Electronically Signed   By: [name] Stanley M.D.   On: 05/02/2018 18:39   Mr Knee Left Wo Contrast  Result Date: 05/03/2018 CLINICAL DATA:  Left below-knee amputation stump infection. EXAM: MRI OF THE LEFT KNEE WITHOUT CONTRAST TECHNIQUE: Multiplanar, multisequence MR imaging of the knee was performed. No intravenous contrast was administered. COMPARISON:  Left knee x-rays from yesterday. FINDINGS: MENISCI Medial meniscus:  Intact. Lateral meniscus:  Intact. LIGAMENTS Cruciates:  Intact ACL and PCL. Collaterals: Medial collateral ligament is intact. Lateral collateral ligament complex is intact. CARTILAGE Patellofemoral:  Mild diffuse thinning without focal defect. Medial:  Mild diffuse thinning without focal defect. Lateral:  Mild diffuse thinning without focal defect. Joint: Small joint effusion. Normal Hoffa's fat. No plical thickening. Popliteal Fossa:  Tiny Baker cyst.  Intact popliteus tendon. Extensor Mechanism: Intact quadriceps tendon and patellar tendon. Intact medial and lateral patellar retinaculum. Intact MPFL. Bones: Prior below-knee amputation. Mild marrow edema at the tibial and fibular osteotomy sites with relatively  preserved T1 marrow signal. No acute fracture or dislocation. No focal bone lesion. Other: Deep soft tissue wound along the medial stump margin extending to the medial tibia. Diffuse muscle edema around the amputation stump. No fluid collection. IMPRESSION: 1. Deep soft tissue wound along the medial stump margin extending close to the medial tibia. Mild marrow edema at the tibial and fibular osteotomy sites with relatively preserved T1 marrow signal is favored to reflect reactive osteitis and less likely early osteomyelitis. 2. No abscess. Electronically Signed   By: Titus Dubin M.D.   On: 05/03/2018 10:33   Dg Chest Portable 1 View  Result Date: 04/23/2018 CLINICAL DATA:  Weakness. EXAM: PORTABLE CHEST 1 VIEW COMPARISON:  03/19/2018 FINDINGS: The heart is enlarged. Prior CABG. Dual lumen dialysis catheter stable position with tips at the level of the RIGHT atrium. No consolidation or edema. No effusion or pneumothorax. Bones unremarkable. IMPRESSION: Cardiomegaly. No active disease. Electronically Signed   By: Staci Righter M.D.   On: 04/23/2018 18:10      Assessment/Plan - 68 yo female with multiple medical problems admitted with left BKA stump infection.  Open wound includes area up to tibial tuberosity.  No evidence of abscess or overt osteo on MRI.  I discussed debridement of the wound with continued open wound and likely woundVAC; however given the extent and infection of the wound it is unlikely to heal and provide a functional BKA.  I recommend conversion to AKA.  The patient is not ready to consent to an above knee amputation at this time.    1. Dakin's BID to stump 2. Continue IV antibiotics 3. NPO after MN for OR tomorrow if patient will consent   Murray Hodgkins, MD  05/03/2018 11:34 AM    This note was created with Dragon medical transcription system.  Any error is purely unintentional

## 2018-05-03 NOTE — ED Notes (Signed)
Report called to [facility] RN on 1A. Full report given to receiving RN at this time

## 2018-05-03 NOTE — Progress Notes (Signed)
[facility], [place] 05/03/18  Subjective:   Patient known to our practice from previous admissions.  Comes in for osteomyelitis/wound infection of the right stump.  No shortness of breath.  Able to eat without nausea or vomiting.  Attended last dialysis session yesterday.  Objective:  Vital signs in last 24 hours:  Temp:  [98.3 F (36.8 C)-98.8 F (37.1 C)] 98.7 F (37.1 C) (01/12 0729) Pulse Rate:  [69-74] 69 (01/12 0729) Resp:  [18] 18 (01/11 2132) BP: (150-212)/(56-101) 157/56 (01/12 0729) SpO2:  [95 %-100 %] 96 % (01/12 0729) Weight:  [47.2 kg] 47.2 kg (01/11 1732)  Weight change:  Filed Weights   05/02/18 1732  Weight: 47.2 kg    Intake/Output:    Intake/Output Summary (Last 24 hours) at 05/03/2018 1245 Last data filed at 05/03/2018 0900 Gross per 24 hour  Intake 439.91 ml  Output -  Net 439.91 ml     Physical Exam: General:  No acute distress, laying in the bed  HEENT  moist oral mucous membranes  Pulm/lungs  room air, clear to auscultation  CVS/Heart  no rub or gallop  Abdomen:   Soft, nontender  Extremities:  Bilateral BKA, right stump foul odor  Neurologic:  Alert, able to answer all questions appropriately  Skin:  No acute rashes  Access:  Left IJ PermCath       Basic Metabolic Panel:  Recent Labs  Lab 05/02/18 1820 05/03/18 0437  NA 137 136  K 2.7* 2.8*  CL 99 99  CO2 28 27  GLUCOSE 98 150*  BUN 6* 10  CREATININE 2.05* 2.93*  CALCIUM 9.1 9.5     CBC: Recent Labs  Lab 05/02/18 1820 05/03/18 0437  WBC 9.2 10.1  NEUTROABS 6.2  --   HGB 11.4* 11.4*  HCT 38.4 38.7  MCV 97.0 96.0  PLT 300 307     No results found for: HEPBSAG, HEPBSAB, HEPBIGM    Microbiology:  Recent Results (from the past 240 hour(s))  Blood culture (routine x 2)     Status: None (Preliminary result)   Collection Time: 05/02/18  6:20 PM  Result Value Ref Range Status   Specimen Description BLOOD LFA  Final   Special Requests    Final    BOTTLES DRAWN AEROBIC AND ANAEROBIC Blood Culture adequate volume   Culture   Final    NO GROWTH < 12 HOURS Performed at [facility], [address], [place], [place] [address]    Report Status PENDING  Incomplete  Blood culture (routine x 2)     Status: None (Preliminary result)   Collection Time: 05/02/18  6:24 PM  Result Value Ref Range Status   Specimen Description BLOOD LWRIST  Final   Special Requests   Final    BOTTLES DRAWN AEROBIC AND ANAEROBIC Blood Culture adequate volume   Culture   Final    NO GROWTH < 12 HOURS Performed at [facility], [address] [place], [place] [address]    Report Status PENDING  Incomplete  MRSA PCR Screening     Status: None   Collection Time: 05/03/18  1:04 AM  Result Value Ref Range Status   MRSA by PCR NEGATIVE NEGATIVE Final    Comment:        The GeneXpert MRSA Assay (FDA approved for NASAL specimens only), is one component of a comprehensive MRSA colonization surveillance program. It is not intended to diagnose MRSA infection nor to guide or monitor treatment  for MRSA infections. Performed at [facility], [address] [place], [place] [address]     Coagulation Studies: No results for input(s): LABPROT, INR in the last 72 hours.  Urinalysis: No results for input(s): COLORURINE, LABSPEC, PHURINE, GLUCOSEU, HGBUR, BILIRUBINUR, KETONESUR, PROTEINUR, UROBILINOGEN, NITRITE, LEUKOCYTESUR in the last 72 hours.  Invalid input(s): APPERANCEUR    Imaging: Dg Knee 1-2 Views Left  Result Date: 05/02/2018 CLINICAL DATA:  Left stump pain. EXAM: LEFT KNEE - 1-2 VIEW COMPARISON:  None. FINDINGS: Patient is status post below the knee amputation. No evidence for bony erosion or destruction at the level of amputation. There appears to be a soft tissue gas medial to the tibial osteotomy site on the frontal film but this is not confirmed on the lateral projection and the soft tissue at the amputation  site is redundant which could generate this appearance. IMPRESSION: Status post below-the-knee amputation with what appears to be soft tissue gas medial to the tibial osteotomy site however redundancy of the overlying soft tissues could cause this appearance. If there is clinical concern for soft tissue infection or osteomyelitis, MRI without and with contrast (renal function permitting) would be the study of choice to further evaluate. Electronically Signed   By: [name] Stanley M.D.   On: 05/02/2018 18:39   Mr Knee Left Wo Contrast  Result Date: 05/03/2018 CLINICAL DATA:  Left below-knee amputation stump infection. EXAM: MRI OF THE LEFT KNEE WITHOUT CONTRAST TECHNIQUE: Multiplanar, multisequence MR imaging of the knee was performed. No intravenous contrast was administered. COMPARISON:  Left knee x-rays from yesterday. FINDINGS: MENISCI Medial meniscus:  Intact. Lateral meniscus:  Intact. LIGAMENTS Cruciates:  Intact ACL and PCL. Collaterals: Medial collateral ligament is intact. Lateral collateral ligament complex is intact. CARTILAGE Patellofemoral:  Mild diffuse thinning without focal defect. Medial:  Mild diffuse thinning without focal defect. Lateral:  Mild diffuse thinning without focal defect. Joint: Small joint effusion. Normal Hoffa's fat. No plical thickening. Popliteal Fossa:  Tiny Baker cyst.  Intact popliteus tendon. Extensor Mechanism: Intact quadriceps tendon and patellar tendon. Intact medial and lateral patellar retinaculum. Intact MPFL. Bones: Prior below-knee amputation. Mild marrow edema at the tibial and fibular osteotomy sites with relatively preserved T1 marrow signal. No acute fracture or dislocation. No focal bone lesion. Other: Deep soft tissue wound along the medial stump margin extending to the medial tibia. Diffuse muscle edema around the amputation stump. No fluid collection. IMPRESSION: 1. Deep soft tissue wound along the medial stump margin extending close to the medial tibia.  Mild marrow edema at the tibial and fibular osteotomy sites with relatively preserved T1 marrow signal is favored to reflect reactive osteitis and less likely early osteomyelitis. 2. No abscess. Electronically Signed   By: Titus Dubin M.D.   On: 05/03/2018 10:33     Medications:   . sodium chloride    . piperacillin-tazobactam (ZOSYN)  IV 3.375 g (05/03/18 1030)   . apixaban  2.5 mg Oral BID  . aspirin EC  81 mg Oral Daily  . atorvastatin  80 mg Oral QHS  . brimonidine  1 drop Both Eyes TID  . calcitRIOL  0.5 mcg Oral Daily  . carvedilol  6.25 mg Oral BID WC  . docusate sodium  100 mg Oral BID  . famotidine  20 mg Oral BID  . folic acid  1 mg Oral Daily  . insulin aspart  0-9 Units Subcutaneous TID WC  . irbesartan  37.5 mg Oral Daily  . isosorbide mononitrate  30 mg Oral Daily  . [START ON 05/04/2018] levETIRAcetam  250 mg Oral Once per day on Mon Wed Fri  . levETIRAcetam  500 mg Oral BID  . [START ON 05/04/2018] midodrine  10 mg Oral Once per day on Mon Wed Fri  . mirtazapine  7.5 mg Oral QHS  . multivitamin with minerals  1 tablet Oral Daily  . multivitamin with minerals  2 tablet Oral Daily  . omega-3 acid ethyl esters  2 capsule Oral Daily  . polyvinyl alcohol  1 drop Both Eyes BID  . potassium chloride  40 mEq Oral Once  . rOPINIRole  0.25 mg Oral QHS  . sevelamer carbonate  800 mg Oral TID WC  . sodium chloride flush  3 mL Intravenous Q12H  . sodium hypochlorite   Irrigation BID  . timolol  1 drop Both Eyes TID  . [START ON 05/05/2018] vancomycin  500 mg Intravenous Q T,Th,Sa-HD  . vitamin B-12  1,000 mcg Oral Daily   sodium chloride, acetaminophen, hydrALAZINE, nitroGLYCERIN, ondansetron **OR** ondansetron (ZOFRAN) IV, ondansetron, sodium chloride flush, traMADol  Assessment/ Plan:  68 y.o. 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  No acute indication for dialysis at present  *Anemia of chronic kidney disease Current hemoglobin 11.4.  Continue to monitor.  May start EPO if hemoglobin trending down  *Secondary hyperparathyroidism Continue home binder regimen.  Monitor phosphorus closely   LOS: Joshua 1/12/202012:45 PM  [place], [place]  Note: This note was prepared with Dragon dictation. Any transcription errors are unintentional

## 2018-05-03 NOTE — ED Notes (Signed)
Wet to dry dressing applied to pt left leg wound by this RN

## 2018-05-03 NOTE — Progress Notes (Signed)
Notified Dell Ponto, Dialysis liaison with patient pathways of admission.

## 2018-05-03 NOTE — Progress Notes (Signed)
[place] at [place] NAME: Stephanie Ellis    MR#:  [id number]  DATE OF BIRTH:  [birth year]  SUBJECTIVE:   Patient admitted to the hospital due to left stump infection.  Patient had recent bilateral BKA and had a infection in the left stump and was discharged to skilled nursing facility with a wound VAC but noted to have foul-smelling drainage from the wound and wound VAC removed and sent to the hospital.  Presently patient is afebrile, denies any chills or any pain.  REVIEW OF SYSTEMS:    Review of Systems  Constitutional: Negative for chills and fever.  HENT: Negative for congestion and tinnitus.   Eyes: Negative for blurred vision and double vision.  Respiratory: Negative for cough, shortness of breath and wheezing.   Cardiovascular: Negative for chest pain, orthopnea and PND.  Gastrointestinal: Negative for abdominal pain, diarrhea, nausea and vomiting.  Genitourinary: Negative for dysuria and hematuria.  Neurological: Negative for dizziness, sensory change and focal weakness.  All other systems reviewed and are negative.   Nutrition: Rneal/Carb Tolerating Diet: Yes Tolerating PT: Await Eval.   DRUG ALLERGIES:   Allergies  Allergen Reactions  . Chlorthalidone Anaphylaxis, Itching and Rash  . Fentanyl Rash  . Midazolam Rash  . Ace Inhibitors Other (See Comments)    Reaction:  Hyperkalemia, agitation   . Angiotensin Receptor Blockers Other (See Comments)    Reaction:  Hyperkalemia, agitation   . Norvasc [Amlodipine] Itching and Rash  . Phenergan [Promethazine Hcl] Anxiety    "antsy, can't sit still"    VITALS:  Blood pressure (!) 157/56, pulse 69, temperature 98.7 F (37.1 C), temperature source Oral, resp. rate 18, height 5' 4"  (1.626 m), weight 47.2 kg, SpO2 96 %.  PHYSICAL EXAMINATION:   Physical Exam  GENERAL:  68 y.o.-year-old patient lying in bed in NAD.  EYES: Pupils equal, round, reactive to light and  accommodation. No scleral icterus. Extraocular muscles intact.  HEENT: Head atraumatic, normocephalic. Oropharynx and nasopharynx clear.  NECK:  Supple, no jugular venous distention. No thyroid enlargement, no tenderness.  LUNGS: Normal breath sounds bilaterally, no wheezing, rales, rhonchi. No use of accessory muscles of respiration.  CARDIOVASCULAR: S1, S2 normal. No murmurs, rubs, or gallops.  ABDOMEN: Soft, nontender, nondistended. Bowel sounds present. No organomegaly or mass.  EXTREMITIES: No cyanosis, clubbing or edema b/l.  B/l BKA with left stump dressing in place.   NEUROLOGIC: Cranial nerves II through XII are intact. No focal Motor or sensory deficits b/l.   PSYCHIATRIC: The patient is alert and oriented x 3.  SKIN: No obvious rash, lesion, or ulcer.    LABORATORY PANEL:   CBC Recent Labs  Lab 05/03/18 0437  WBC 10.1  HGB 11.4*  HCT 38.7  PLT 307   ------------------------------------------------------------------------------------------------------------------  Chemistries  Recent Labs  Lab 05/02/18 1820 05/03/18 0437  NA 137 136  K 2.7* 2.8*  CL 99 99  CO2 28 27  GLUCOSE 98 150*  BUN 6* 10  CREATININE 2.05* 2.93*  CALCIUM 9.1 9.5  AST 19  --   ALT 9  --   ALKPHOS 106  --   BILITOT 0.5  --    ------------------------------------------------------------------------------------------------------------------  Cardiac Enzymes No results for input(s): TROPONINI in the last 168 hours. ------------------------------------------------------------------------------------------------------------------  RADIOLOGY:  Dg Knee 1-2 Views Left  Result Date: 05/02/2018 CLINICAL DATA:  Left stump pain. EXAM: LEFT KNEE - 1-2 VIEW COMPARISON:  None. FINDINGS: Patient is status post  below the knee amputation. No evidence for bony erosion or destruction at the level of amputation. There appears to be a soft tissue gas medial to the tibial osteotomy site on the frontal film  but this is not confirmed on the lateral projection and the soft tissue at the amputation site is redundant which could generate this appearance. IMPRESSION: Status post below-the-knee amputation with what appears to be soft tissue gas medial to the tibial osteotomy site however redundancy of the overlying soft tissues could cause this appearance. If there is clinical concern for soft tissue infection or osteomyelitis, MRI without and with contrast (renal function permitting) would be the study of choice to further evaluate. Electronically Signed   By: [name] Stanley M.D.   On: 05/02/2018 18:39   Mr Knee Left Wo Contrast  Result Date: 05/03/2018 CLINICAL DATA:  Left below-knee amputation stump infection. EXAM: MRI OF THE LEFT KNEE WITHOUT CONTRAST TECHNIQUE: Multiplanar, multisequence MR imaging of the knee was performed. No intravenous contrast was administered. COMPARISON:  Left knee x-rays from yesterday. FINDINGS: MENISCI Medial meniscus:  Intact. Lateral meniscus:  Intact. LIGAMENTS Cruciates:  Intact ACL and PCL. Collaterals: Medial collateral ligament is intact. Lateral collateral ligament complex is intact. CARTILAGE Patellofemoral:  Mild diffuse thinning without focal defect. Medial:  Mild diffuse thinning without focal defect. Lateral:  Mild diffuse thinning without focal defect. Joint: Small joint effusion. Normal Hoffa's fat. No plical thickening. Popliteal Fossa:  Tiny Baker cyst.  Intact popliteus tendon. Extensor Mechanism: Intact quadriceps tendon and patellar tendon. Intact medial and lateral patellar retinaculum. Intact MPFL. Bones: Prior below-knee amputation. Mild marrow edema at the tibial and fibular osteotomy sites with relatively preserved T1 marrow signal. No acute fracture or dislocation. No focal bone lesion. Other: Deep soft tissue wound along the medial stump margin extending to the medial tibia. Diffuse muscle edema around the amputation stump. No fluid collection. IMPRESSION: 1.  Deep soft tissue wound along the medial stump margin extending close to the medial tibia. Mild marrow edema at the tibial and fibular osteotomy sites with relatively preserved T1 marrow signal is favored to reflect reactive osteitis and less likely early osteomyelitis. 2. No abscess. Electronically Signed   By: Titus Dubin M.D.   On: 05/03/2018 10:33     ASSESSMENT AND PLAN:   68 year old female with past medical history of end-stage renal disease on hemodialysis, peripheral vascular disease status post bilateral BKA, recent infection of the left stump, glaucoma, hypertension, hyperlipidemia, diabetes, history of seizures who presented to the hospital due to foul-smelling drainage and suspected infection of the left BKA stump.  1.  Infection of the left BKA stump- MRI of the left leg showing some osteitis but less likely osteomyelitis. -Continue broad-spectrum IV antibiotics with vancomycin, Zosyn. - Await vascular surgery input but patient will likely need further debridement. - We will consult infectious disease tomorrow.  2.  End-stage renal disease on hemodialysis-patient gets dialysis on Tuesday Thursday and Saturday. -Nephrology has been consulted.  Continue care as per them.    3.  Essential hypertension-continue carvedilol, Avapro, Imdur.   4.  Restless leg syndrome-continue Requip.  5.  Secondary hyperparathyroidism-continue Renvela.  6.  Hx of seizures-no acute seizure type activity.  Continue Keppra.  7.  Chronic hypotension- continue midodrine with dialysis     All the records are reviewed and case discussed with Care Management/Social Worker. Management plans discussed with the patient, family and they are in agreement.  CODE STATUS: Full code  DVT Prophylaxis: Eliquis  TOTAL TIME TAKING CARE OF THIS PATIENT: 30 minutes.   POSSIBLE D/C IN 2-3 DAYS, DEPENDING ON CLINICAL CONDITION.   Henreitta Leber M.D on 05/03/2018 at 11:51 AM  Between 7am to 6pm - Pager -  [phone]  After 6pm go to [email or web address] - Proofreader  [facility]  Office  [phone]  CC: Primary care physician; Hanley Ben, NP

## 2018-05-03 NOTE — NC FL2 (Signed)
[place] LEVEL OF CARE SCREENING TOOL     IDENTIFICATION  Patient Name: Stephanie Ellis Birthdate: 1950-12-29 Sex: female Admission Date (Current Location): 05/02/2018  [facility] and [place] Number:  Engineering geologist and Address:  [facility], [address], [place], [place] [address]      Provider Number: [id number]  Attending Physician Name and Address:  Henreitta Leber, MD  Relative Name and Phone Number:  [name] Daughter [phone]     Current Level of Care: SNF Recommended Level of Care:   Prior Approval Number:    Date Approved/Denied:   [place] Number: [id number]  Discharge Plan: SNF    Current Diagnoses: Patient Active Problem List   Diagnosis Date Noted  . Infection of amputation stump ([place]) 05/02/2018  . BKA stump complication ([place]) 83/38/2505  . Unilateral complete BKA, right, initial encounter ([place]) 03/30/2018  . Transient loss of consciousness 03/19/2018  . Transient alteration of awareness   . VT (ventricular tachycardia) ([place])   . Disorientation   . Hypertensive urgency 03/08/2018  . Ischemia of extremity 02/18/2018  . ESRD (end stage renal disease) ([place])   . Diabetic osteomyelitis ([place])   . Peripheral vascular disease ([place])   . Advanced care planning/counseling discussion   . Palliative care by specialist   . Goals of care, counseling/discussion   . Diabetes mellitus, type 2 ([place]) 01/30/2018  . Sepsis ([place]) 01/29/2018  . Peripheral neuropathy 06/17/2017  . Encephalopathy 02/06/2017  . Hypertensive emergency 02/06/2017  . Endophthalmitis, left eye 12/05/2016  . Vomiting 10/24/2016  . Hematuria 10/17/2016  . Acute lower UTI 10/17/2016  . Anesthesia complication 39/76/7341  . Chest pain with moderate risk for cardiac etiology 09/04/2016  . Steal syndrome dialysis vascular access, initial encounter ([place]) 06/18/2016  . 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 Coronary artery  disease involving coronary bypass graft of native heart with angina pectoris ([place]) 02/10/2016  . Hypertension 02/02/2016  . Hyperlipidemia 02/02/2016  . Coronary artery disease involving left main coronary artery 10/04/2015  . Abdominal pain of unknown etiology 10/04/2015  . ESRD (end stage renal disease) on dialysis ([place])   . Type II diabetes mellitus with complication ([place])   . NSTEMI (non-ST elevated myocardial infarction) ([place])   . Right sided abdominal pain   . Elevated troponin 10/03/2015  . 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 Chronic diastolic congestive heart failure ([place]) 04/19/2014  . Stage 5 chronic kidney disease on chronic dialysis ([place]) 04/19/2014  . Anemia 09/20/2013  . Routine health maintenance 01/29/2013    Orientation RESPIRATION BLADDER Height & Weight     Self, Time, Situation, Place  Normal Continent Weight: 104 lb (47.2 kg) Height:  5' 4"  (162.6 cm)  BEHAVIORAL SYMPTOMS/MOOD NEUROLOGICAL BOWEL NUTRITION STATUS      Continent Diet(NPO to be advance see discharge summary)  AMBULATORY STATUS COMMUNICATION OF NEEDS Skin   Extensive Assist Verbally Normal                       Personal Care Assistance Level of Assistance  Bathing, Feeding, Dressing Bathing Assistance: Limited assistance Feeding assistance: Independent Dressing Assistance: Limited assistance     Functional Limitations Info  Sight, Hearing, Speech Sight Info: Adequate Hearing Info: Adequate Speech Info: Adequate    SPECIAL CARE FACTORS FREQUENCY                       Contractures Contractures Info: Not present  Additional Factors Info  Code Status Code Status Info: DNR             Current Medications (05/03/2018):  This is the current hospital active medication list Current Facility-Administered Medications  Medication Dose Route Frequency Provider Last Rate Last Dose  . 0.9 %  sodium chloride infusion  250 mL Intravenous PRN Dustin Flock, MD      . acetaminophen  (TYLENOL) tablet 650 mg  650 mg Oral Q6H PRN Dustin Flock, MD      . apixaban Arne Cleveland) tablet 2.5 mg  2.5 mg Oral BID Dustin Flock, MD   2.5 mg at 05/03/18 1032  . aspirin EC tablet 81 mg  81 mg Oral Daily Dustin Flock, MD   81 mg at 05/03/18 1031  . atorvastatin (LIPITOR) tablet 80 mg  80 mg Oral QHS Dustin Flock, MD      . brimonidine (ALPHAGAN) 0.2 % ophthalmic solution 1 drop  1 drop Both Eyes TID Dustin Flock, MD   1 drop at 05/03/18 1035  . calcitRIOL (ROCALTROL) capsule 0.5 mcg  0.5 mcg Oral Daily Dustin Flock, MD   0.5 mcg at 05/03/18 1035  . carvedilol (COREG) tablet 6.25 mg  6.25 mg Oral BID WC Dustin Flock, MD   6.25 mg at 05/03/18 0800  . docusate sodium (COLACE) capsule 100 mg  100 mg Oral BID Dustin Flock, MD   100 mg at 05/03/18 1031  . famotidine (PEPCID) tablet 20 mg  20 mg Oral BID Dustin Flock, MD   20 mg at 05/03/18 1031  . folic acid (FOLVITE) tablet 1 mg  1 mg Oral Daily Dustin Flock, MD   1 mg at 05/03/18 1031  . hydrALAZINE (APRESOLINE) injection 10 mg  10 mg Intravenous Q6H PRN Dustin Flock, MD   10 mg at 05/02/18 2057  . insulin aspart (novoLOG) injection 0-9 Units  0-9 Units Subcutaneous TID WC Dustin Flock, MD   1 Units at 05/03/18 1214  . irbesartan (AVAPRO) tablet 37.5 mg  37.5 mg Oral Daily Dustin Flock, MD   37.5 mg at 05/03/18 1030  . isosorbide mononitrate (IMDUR) 24 hr tablet 30 mg  30 mg Oral Daily Dustin Flock, MD   30 mg at 05/03/18 1035  . [START ON 05/04/2018] levETIRAcetam (KEPPRA) tablet 250 mg  250 mg Oral Once per day on Mon Wed Fri Patel, Shreyang, MD      . levETIRAcetam (KEPPRA) tablet 500 mg  500 mg Oral BID Dustin Flock, MD   500 mg at 05/03/18 1035  . [START ON 05/04/2018] midodrine (PROAMATINE) tablet 10 mg  10 mg Oral Once per day on Mon Wed Fri Dustin Flock, MD      . mirtazapine (REMERON) tablet 7.5 mg  7.5 mg Oral QHS Dustin Flock, MD      . multivitamin with minerals tablet 1 tablet  1 tablet  Oral Daily Dustin Flock, MD      . multivitamin with minerals tablet 2 tablet  2 tablet Oral Daily Dustin Flock, MD   2 tablet at 05/03/18 1030  . nitroGLYCERIN (NITROSTAT) SL tablet 0.4 mg  0.4 mg Sublingual Q5 min PRN Dustin Flock, MD      . omega-3 acid ethyl esters (LOVAZA) capsule 2 g  2 capsule Oral Daily Dustin Flock, MD   2 g at 05/03/18 1031  . ondansetron (ZOFRAN) tablet 4 mg  4 mg Oral Q6H PRN Dustin Flock, MD       Or  . ondansetron [facility]) injection  4 mg  4 mg Intravenous Q6H PRN Dustin Flock, MD      . ondansetron [facility]) tablet 4 mg  4 mg Oral Q8H PRN Dustin Flock, MD      . piperacillin-tazobactam (ZOSYN) IVPB 3.375 g  3.375 g Intravenous Q12H Dustin Flock, MD   Stopped at 05/03/18 1311  . polyvinyl alcohol (LIQUIFILM TEARS) 1.4 % ophthalmic solution 1 drop  1 drop Both Eyes BID Dustin Flock, MD   1 drop at 05/03/18 1036  . potassium chloride SA (K-DUR,KLOR-CON) CR tablet 40 mEq  40 mEq Oral Once Dustin Flock, MD      . rOPINIRole (REQUIP) tablet 0.25 mg  0.25 mg Oral QHS Dustin Flock, MD      . sevelamer carbonate (RENVELA) tablet 800 mg  800 mg Oral TID WC Dustin Flock, MD   800 mg at 05/03/18 1215  . sodium chloride flush (NS) 0.9 % injection 3 mL  3 mL Intravenous Q12H Dustin Flock, MD   3 mL at 05/03/18 1036  . sodium chloride flush (NS) 0.9 % injection 3 mL  3 mL Intravenous PRN Dustin Flock, MD      . sodium hypochlorite (DAKIN'S 1/4 STRENGTH) topical solution   Irrigation BID Cafasso, Danielle E, MD      . timolol (TIMOPTIC) 0.5 % ophthalmic solution 1 drop  1 drop Both Eyes TID Dustin Flock, MD   1 drop at 05/03/18 1036  . traMADol (ULTRAM) tablet 50 mg  50 mg Oral Q8H PRN Dustin Flock, MD      . Derrill Memo ON 05/05/2018] vancomycin (VANCOCIN) IVPB 500 mg/100 ml premix  500 mg Intravenous Q T,Th,Sa-HD Dustin Flock, MD      . vitamin B-12 (CYANOCOBALAMIN) tablet 1,000 mcg  1,000 mcg Oral Daily Dustin Flock, MD   1,000 mcg  at 05/03/18 1031     Discharge Medications: Please see discharge summary for a list of discharge medications.  Relevant Imaging Results:  Relevant Lab Results:   Additional Information: [facility] [place] TTS hemodialysis IJ permcath SSn: [id number]  Berenice Bouton, LCSW

## 2018-05-04 DIAGNOSIS — T8789 Other complications of amputation stump: Secondary | ICD-10-CM

## 2018-05-04 DIAGNOSIS — Z89512 Acquired absence of left leg below knee: Secondary | ICD-10-CM

## 2018-05-04 LAB — CBC
HCT: 33.1 % — ABNORMAL LOW (ref 36.0–46.0)
Hemoglobin: 9.7 g/dL — ABNORMAL LOW (ref 12.0–15.0)
MCH: 28.2 pg (ref 26.0–34.0)
MCHC: 29.3 g/dL — ABNORMAL LOW (ref 30.0–36.0)
MCV: 96.2 fL (ref 80.0–100.0)
Platelets: 260 10*3/uL (ref 150–400)
RBC: 3.44 MIL/uL — ABNORMAL LOW (ref 3.87–5.11)
RDW: 18.7 % — ABNORMAL HIGH (ref 11.5–15.5)
WBC: 9 10*3/uL (ref 4.0–10.5)
nRBC: 0 % (ref 0.0–0.2)

## 2018-05-04 LAB — GLUCOSE, CAPILLARY
Glucose-Capillary: 119 mg/dL — ABNORMAL HIGH (ref 70–99)
Glucose-Capillary: 122 mg/dL — ABNORMAL HIGH (ref 70–99)
Glucose-Capillary: 123 mg/dL — ABNORMAL HIGH (ref 70–99)
Glucose-Capillary: 166 mg/dL — ABNORMAL HIGH (ref 70–99)

## 2018-05-04 LAB — POTASSIUM: Potassium: 2.9 mmol/L — ABNORMAL LOW (ref 3.5–5.1)

## 2018-05-04 MED ORDER — MIDODRINE HCL 5 MG PO TABS
10.0000 mg | ORAL_TABLET | ORAL | Status: DC
  Administered 2018-05-05 – 2018-05-09 (×2): 10 mg via ORAL
  Filled 2018-05-04 (×2): qty 2

## 2018-05-04 MED ORDER — FAMOTIDINE 20 MG PO TABS
20.0000 mg | ORAL_TABLET | Freq: Every day | ORAL | Status: DC
  Administered 2018-05-05 – 2018-05-12 (×8): 20 mg via ORAL
  Filled 2018-05-04 (×8): qty 1

## 2018-05-04 MED ORDER — NEPRO/CARBSTEADY PO LIQD
237.0000 mL | Freq: Two times a day (BID) | ORAL | Status: DC
  Administered 2018-05-05 – 2018-05-10 (×3): 237 mL via ORAL

## 2018-05-04 MED ORDER — VANCOMYCIN HCL 500 MG IV SOLR
500.0000 mg | INTRAVENOUS | Status: DC
  Administered 2018-05-05: 500 mg via INTRAVENOUS
  Filled 2018-05-04 (×3): qty 500

## 2018-05-04 NOTE — Progress Notes (Addendum)
[facility]  Daily Progress Note   Subjective: Patient continues to experience pain to the left below the knee amputation stump wound.  I took down the patient's dressing and examined her wound.  There seems to be areas of granulation tissue and about 60 to 65% fibrinous exudate with a foul odor.  Minimal drainage noted.  Thigh is soft.  I had a long discussion with the patient in regard to her surgical options.  We can debride her stump wound hopefully removing any nonviable tissue and reapplying the VAC to promote wound healing.  The patient seems to be very hesitant about this option as she feels the [facility] dressing is the cause of her failing stump.  We also discussed an above-the-knee amputation which would require no VAC dressing.  At this time, the patient is not moving forward with any type of surgical intervention.  She would like to continue to "clean the wound" and see if "it heals".  I had a long discussion with the patient that if we did not remove any nonviable tissue it would be very hard for her to heal her wound.  The patient expresses her understanding however still wants to pursue conservative treatment including local wound care at this time.  Objective: Vitals:   05/03/18 0729 05/03/18 1628 05/03/18 2300 05/04/18 0857  BP: (!) 157/56 (!) 130/48 (!) 157/58 (!) 120/59  Pulse: 69 69 (!) 58 64  Resp:   18 16  Temp: 98.7 F (37.1 C) 99 F (37.2 C) 98.1 F (36.7 C) 97.9 F (36.6 C)  TempSrc: Oral Oral Oral Oral  SpO2: 96% 98% 96% 100%  Weight:      Height:        Intake/Output Summary (Last 24 hours) at 05/04/2018 1408 Last data filed at 05/04/2018 1227 Gross per 24 hour  Intake 36.16 ml  Output -  Net 36.16 ml   Physical Exam: A&Ox3, NAD CV: RRR Pulmonary: CTA Bilaterally Abdomen: Soft, Nontender, Nondistended Vascular:  Right Lower Extremity: BKA healed. Every other suture removed. Will remove remaining sutures in one week.   Left Lower  Extremity: There seems to be areas of granulation tissue and about 60 to 65% fibrinous exudate with a foul odor.  Minimal drainage noted.  Thigh is soft. Redressed wound.    Laboratory: CBC    Component Value Date/Time   WBC 9.0 05/04/2018 0240   HGB 9.7 (L) 05/04/2018 0240   HCT 33.1 (L) 05/04/2018 0240   PLT 260 05/04/2018 0240   BMET    Component Value Date/Time   NA 136 05/03/2018 0437   K 2.9 (L) 05/04/2018 0240   CL 99 05/03/2018 0437   CO2 27 05/03/2018 0437   GLUCOSE 150 (H) 05/03/2018 0437   BUN 10 05/03/2018 0437   CREATININE 2.93 (H) 05/03/2018 0437   CALCIUM 9.5 05/03/2018 0437   GFRNONAA 16 (L) 05/03/2018 0437   GFRAA 18 (L) 05/03/2018 0437   Assessment/Planning: The patient is a 68 year old female with known hx of severe PAD to her bilateral lower extremity and subsequent bilateral BKA's, presents with worsening wound dehiscence to the left BKA stump. 1) See above in regard to discussion I had with patient today 2) At this time, the patient continues to refuse any surgical intervention. 3) Without surgical intervention it will be very hard to heal her BKA without debridement or further amputation.  4) She understands that her wound can and most likely will continue to worsening and  she may become septic from it.  5) The patient expresses her understanding and is not consenting for any vascular intervention at this time.  6) Recommend local wound care / ABX as per wound care / medicine / ID. 7) Will continue to follow  8) Will consult wound care for local wound care recommendations, nutrition / diabetes consult to maximize wound healing. 9) PT / OT will in house  Discussed with Dr. Ellis Parents Cederick Broadnax PA-C 05/04/2018 2:08 PM

## 2018-05-04 NOTE — Progress Notes (Signed)
[facility] [place], [place] 05/04/18  Subjective:  Patient resting comfortably in bed today.   overall feeling well. Last dialysis was on Saturday.   Objective:  Vital signs in last 24 hours:  Temp:  [97.9 F (36.6 C)-99 F (37.2 C)] 97.9 F (36.6 C) (01/13 0857) Pulse Rate:  [58-69] 64 (01/13 0857) Resp:  [16-18] 16 (01/13 0857) BP: (120-157)/(48-59) 120/59 (01/13 0857) SpO2:  [96 %-100 %] 100 % (01/13 0857)  Weight change:  Filed Weights   05/02/18 1732  Weight: 47.2 kg    Intake/Output:    Intake/Output Summary (Last 24 hours) at 05/04/2018 1409 Last data filed at 05/04/2018 1227 Gross per 24 hour  Intake 36.16 ml  Output -  Net 36.16 ml     Physical Exam: General:  No acute distress, laying in the bed  HEENT  moist oral mucous membranes  Pulm/lungs  room air, clear to auscultation  CVS/Heart  no rub or gallop  Abdomen:   Soft, nontender  Extremities:  Bilateral BKA, right stump foul odor  Neurologic:  Alert, able to answer all questions appropriately  Skin:  No acute rashes  Access:  Left IJ PermCath       Basic Metabolic Panel:  Recent Labs  Lab 05/02/18 1820 05/03/18 0437 05/04/18 0240  NA 137 136  --   K 2.7* 2.8* 2.9*  CL 99 99  --   CO2 28 27  --   GLUCOSE 98 150*  --   BUN 6* 10  --   CREATININE 2.05* 2.93*  --   CALCIUM 9.1 9.5  --      CBC: Recent Labs  Lab 05/02/18 1820 05/03/18 0437 05/04/18 0240  WBC 9.2 10.1 9.0  NEUTROABS 6.2  --   --   HGB 11.4* 11.4* 9.7*  HCT 38.4 38.7 33.1*  MCV 97.0 96.0 96.2  PLT 300 307 260     No results found for: HEPBSAG, HEPBSAB, HEPBIGM    Microbiology:  Recent Results (from the past 240 hour(s))  Aerobic/Anaerobic Culture (surgical/deep wound)     Status: None (Preliminary result)   Collection Time: 05/02/18  6:20 PM  Result Value Ref Range Status   Specimen Description   Final    WOUND Performed at [facility], [address] [place],  [place] [address]    Special Requests   Final    Normal Performed at [facility], [address] [place], [place] [address]    Gram Stain   Final    FEW WBC PRESENT, PREDOMINANTLY PMN ABUNDANT GRAM VARIABLE ROD    Culture   Final    MODERATE GRAM NEGATIVE RODS IDENTIFICATION AND SUSCEPTIBILITIES TO FOLLOW Performed at [facility], [place] [address] [place], [place] [address]    Report Status PENDING  Incomplete  Blood culture (routine x 2)     Status: None (Preliminary result)   Collection Time: 05/02/18  6:20 PM  Result Value Ref Range Status   Specimen Description BLOOD LFA  Final   Special Requests   Final    BOTTLES DRAWN AEROBIC AND ANAEROBIC Blood Culture adequate volume   Culture   Final    NO GROWTH 2 DAYS Performed at [facility], [address] [place], [place] [address]    Report Status PENDING  Incomplete  Blood culture (routine x 2)     Status: None (Preliminary result)   Collection Time: 05/02/18  6:24 PM  Result Value Ref Range Status  Specimen Description BLOOD LWRIST  Final   Special Requests   Final    BOTTLES DRAWN AEROBIC AND ANAEROBIC Blood Culture adequate volume   Culture   Final    NO GROWTH 2 DAYS Performed at [facility], [address] [place], [place] [address]    Report Status PENDING  Incomplete  MRSA PCR Screening     Status: None   Collection Time: 05/03/18  1:04 AM  Result Value Ref Range Status   MRSA by PCR NEGATIVE NEGATIVE Final    Comment:        The GeneXpert MRSA Assay (FDA approved for NASAL specimens only), is one component of a comprehensive MRSA colonization surveillance program. It is not intended to diagnose MRSA infection nor to guide or monitor treatment for MRSA infections. Performed at [facility], [address] [place], [place] [address]     Coagulation Studies: No results for input(s): LABPROT, INR in the last 72 hours.  Urinalysis: No results for input(s):  COLORURINE, LABSPEC, PHURINE, GLUCOSEU, HGBUR, BILIRUBINUR, KETONESUR, PROTEINUR, UROBILINOGEN, NITRITE, LEUKOCYTESUR in the last 72 hours.  Invalid input(s): APPERANCEUR    Imaging: Dg Knee 1-2 Views Left  Result Date: 05/02/2018 CLINICAL DATA:  Left stump pain. EXAM: LEFT KNEE - 1-2 VIEW COMPARISON:  None. FINDINGS: Patient is status post below the knee amputation. No evidence for bony erosion or destruction at the level of amputation. There appears to be a soft tissue gas medial to the tibial osteotomy site on the frontal film but this is not confirmed on the lateral projection and the soft tissue at the amputation site is redundant which could generate this appearance. IMPRESSION: Status post below-the-knee amputation with what appears to be soft tissue gas medial to the tibial osteotomy site however redundancy of the overlying soft tissues could cause this appearance. If there is clinical concern for soft tissue infection or osteomyelitis, MRI without and with contrast (renal function permitting) would be the study of choice to further evaluate. Electronically Signed   By: [name] Stanley M.D.   On: 05/02/2018 18:39   Mr Knee Left Wo Contrast  Result Date: 05/03/2018 CLINICAL DATA:  Left below-knee amputation stump infection. EXAM: MRI OF THE LEFT KNEE WITHOUT CONTRAST TECHNIQUE: Multiplanar, multisequence MR imaging of the knee was performed. No intravenous contrast was administered. COMPARISON:  Left knee x-rays from yesterday. FINDINGS: MENISCI Medial meniscus:  Intact. Lateral meniscus:  Intact. LIGAMENTS Cruciates:  Intact ACL and PCL. Collaterals: Medial collateral ligament is intact. Lateral collateral ligament complex is intact. CARTILAGE Patellofemoral:  Mild diffuse thinning without focal defect. Medial:  Mild diffuse thinning without focal defect. Lateral:  Mild diffuse thinning without focal defect. Joint: Small joint effusion. Normal Hoffa's fat. No plical thickening. Popliteal Fossa:   Tiny Baker cyst.  Intact popliteus tendon. Extensor Mechanism: Intact quadriceps tendon and patellar tendon. Intact medial and lateral patellar retinaculum. Intact MPFL. Bones: Prior below-knee amputation. Mild marrow edema at the tibial and fibular osteotomy sites with relatively preserved T1 marrow signal. No acute fracture or dislocation. No focal bone lesion. Other: Deep soft tissue wound along the medial stump margin extending to the medial tibia. Diffuse muscle edema around the amputation stump. No fluid collection. IMPRESSION: 1. Deep soft tissue wound along the medial stump margin extending close to the medial tibia. Mild marrow edema at the tibial and fibular osteotomy sites with relatively preserved T1 marrow signal is favored to reflect reactive osteitis and less likely early osteomyelitis. 2. No abscess. Electronically Signed  By: Titus Dubin M.D.   On: 05/03/2018 10:33     Medications:   . sodium chloride    . piperacillin-tazobactam (ZOSYN)  IV 3.375 g (05/04/18 1222)   . apixaban  2.5 mg Oral BID  . aspirin EC  81 mg Oral Daily  . atorvastatin  80 mg Oral QHS  . brimonidine  1 drop Both Eyes TID  . calcitRIOL  0.5 mcg Oral Daily  . carvedilol  6.25 mg Oral BID WC  . docusate sodium  100 mg Oral BID  . [START ON 05/05/2018] famotidine  20 mg Oral Daily  . feeding supplement (NEPRO CARB STEADY)  237 mL Oral BID BM  . folic acid  1 mg Oral Daily  . insulin aspart  0-9 Units Subcutaneous TID WC  . irbesartan  37.5 mg Oral Daily  . isosorbide mononitrate  30 mg Oral Daily  . levETIRAcetam  250 mg Oral Once per day on Mon Wed Fri  . levETIRAcetam  500 mg Oral BID  . [START ON 05/05/2018] midodrine  10 mg Oral Q T,Th,Sa-HD  . mirtazapine  7.5 mg Oral QHS  . multivitamin with minerals  1 tablet Oral Daily  . multivitamin with minerals  2 tablet Oral Daily  . omega-3 acid ethyl esters  2 capsule Oral Daily  . polyvinyl alcohol  1 drop Both Eyes BID  . potassium chloride  40 mEq  Oral Once  . rOPINIRole  0.25 mg Oral QHS  . sevelamer carbonate  800 mg Oral TID WC  . sodium chloride flush  3 mL Intravenous Q12H  . sodium hypochlorite   Irrigation BID  . timolol  1 drop Both Eyes TID  . [START ON 05/05/2018] vancomycin  500 mg Intravenous Q T,Th,Sa-HD  . vitamin B-12  1,000 mcg Oral Daily   sodium chloride, acetaminophen, hydrALAZINE, nitroGLYCERIN, ondansetron **OR** ondansetron (ZOFRAN) IV, ondansetron, sodium chloride flush, traMADol  Assessment/ Plan:  68 y.o. African-American female with end stage renal disease on hemodialysis, hypertension, congestive heart failure, coronary artery disease status post CABG, diabetes mellitus type II insulin dependent, GERD, peripheral vascular disease status post right BKA, history of endocarditis and osteomyelitis  [facility] [place] TTS hemodialysis IJ permcath  *End-stage renal disease Patient completed hemodialysis on Saturday.  Next dialysis treatment scheduled for tomorrow as an outpatient.  *Anemia of chronic kidney disease Recommend continued monitoring of hemoglobin as an outpatient.  *Secondary hyperparathyroidism Maintain the patient on Renvela 800 mg 3 times a day with meals.   LOS: 2 Illana Nolting 1/13/20202:09 PM  [place], [place]  Note: This note was prepared with Dragon dictation. Any transcription errors are unintentional

## 2018-05-04 NOTE — Progress Notes (Signed)
Inpatient Diabetes Program Recommendations  AACE/ADA: New Consensus Statement on Inpatient Glycemic Control   Target Ranges:  Prepandial:   less than 140 mg/dL      Peak postprandial:   less than 180 mg/dL (1-2 hours)      Critically ill patients:  140 - 180 mg/dL  Results for GRACIOUS, RENKEN (MRN [id number]) as of 05/04/2018 15:47  Ref. Range 05/03/2018 07:31 05/03/2018 11:34 05/03/2018 16:30 05/03/2018 20:55 05/04/2018 08:54 05/04/2018 11:45  Glucose-Capillary Latest Ref Range: 70 - 99 mg/dL 129 (H) 148 (H) 160 (H) 81 119 (H) 122 (H)   Results for ALGA, SOUTHALL (MRN [id number]) as of 05/04/2018 15:47  Ref. Range 10/24/2016 08:46 02/18/2018 18:28  Hemoglobin A1C Latest Ref Range: 4.8 - 5.6 % 6.9 (H) 6.5 (H)   Review of Glycemic Control  Diabetes history: DM2 Outpatient Diabetes medications: None Current orders for Inpatient glycemic control: Novolog 0-9 units TID with meals  Inpatient Diabetes Program Recommendations:  HbgA1C: Please consider ordering an A1C to evaluate glycemic control over the past 2-3 months.  NOTE: Noted consult for Inpatient Diabetes Coordinator. Chart reviewed.  Glucose has ranged from 81-160 mg/dl over the past 30 hours. Agree with current orders for inpatient glycemic control. Would recommend ordering a current A1C. Will follow while inpatient.  Thanks, Barnie Alderman, RN, MSN, CDE Diabetes Coordinator Inpatient Diabetes Program [phone] (Team Pager from 8am to 5pm)

## 2018-05-04 NOTE — Progress Notes (Signed)
[facility] [place] NAME: Stephanie Ellis    MR#:  [id number]  DATE OF BIRTH:  Aug 17, 1950  SUBJECTIVE:   Pt. Refusing to get a AKA on left leg.  Pt. Here due to left stump infection not improving with conservative therapy. Afebrile, No other complaints.   REVIEW OF SYSTEMS:    Review of Systems  Constitutional: Negative for chills and fever.  HENT: Negative for congestion and tinnitus.   Eyes: Negative for blurred vision and double vision.  Respiratory: Negative for cough, shortness of breath and wheezing.   Cardiovascular: Negative for chest pain, orthopnea and PND.  Gastrointestinal: Negative for abdominal pain, diarrhea, nausea and vomiting.  Genitourinary: Negative for dysuria and hematuria.  Neurological: Negative for dizziness, sensory change and focal weakness.  All other systems reviewed and are negative.   Nutrition: Rneal/Carb Tolerating Diet: Yes  DRUG ALLERGIES:   Allergies  Allergen Reactions  . Chlorthalidone Anaphylaxis, Itching and Rash  . Fentanyl Rash  . Midazolam Rash  . Ace Inhibitors Other (See Comments)    Reaction:  Hyperkalemia, agitation   . Angiotensin Receptor Blockers Other (See Comments)    Reaction:  Hyperkalemia, agitation   . Norvasc [Amlodipine] Itching and Rash  . Phenergan [Promethazine Hcl] Anxiety    "antsy, can't sit still"    VITALS:  Blood pressure (!) 120/59, pulse 64, temperature 97.9 F (36.6 C), temperature source Oral, resp. rate 16, height 5' 4"  (1.626 m), weight 47.2 kg, SpO2 100 %.  PHYSICAL EXAMINATION:   Physical Exam  GENERAL:  68 y.o.-year-old patient lying in bed in NAD.  EYES: Pupils equal, round, reactive to light and accommodation. No scleral icterus. Extraocular muscles intact.  HEENT: Head atraumatic, normocephalic. Oropharynx and nasopharynx clear.  NECK:  Supple, no jugular venous distention. No thyroid enlargement, no tenderness.  LUNGS: Normal breath  sounds bilaterally, no wheezing, rales, rhonchi. No use of accessory muscles of respiration.  CARDIOVASCULAR: S1, S2 normal. No murmurs, rubs, or gallops.  ABDOMEN: Soft, nontender, nondistended. Bowel sounds present. No organomegaly or mass.  EXTREMITIES: No cyanosis, clubbing or edema b/l.  B/l BKA with left stump dressing in place.   NEUROLOGIC: Cranial nerves II through XII are intact. No focal Motor or sensory deficits b/l.   PSYCHIATRIC: The patient is alert and oriented x 3.  SKIN: No obvious rash, lesion, or ulcer.    LABORATORY PANEL:   CBC Recent Labs  Lab 05/04/18 0240  WBC 9.0  HGB 9.7*  HCT 33.1*  PLT 260   ------------------------------------------------------------------------------------------------------------------  Chemistries  Recent Labs  Lab 05/02/18 1820 05/03/18 0437 05/04/18 0240  NA 137 136  --   K 2.7* 2.8* 2.9*  CL 99 99  --   CO2 28 27  --   GLUCOSE 98 150*  --   BUN 6* 10  --   CREATININE 2.05* 2.93*  --   CALCIUM 9.1 9.5  --   AST 19  --   --   ALT 9  --   --   ALKPHOS 106  --   --   BILITOT 0.5  --   --    ------------------------------------------------------------------------------------------------------------------  Cardiac Enzymes No results for input(s): TROPONINI in the last 168 hours. ------------------------------------------------------------------------------------------------------------------  RADIOLOGY:  Dg Knee 1-2 Views Left  Result Date: 05/02/2018 CLINICAL DATA:  Left stump pain. EXAM: LEFT KNEE - 1-2 VIEW COMPARISON:  None. FINDINGS: Patient is status post below the knee amputation. No evidence  for bony erosion or destruction at the level of amputation. There appears to be a soft tissue gas medial to the tibial osteotomy site on the frontal film but this is not confirmed on the lateral projection and the soft tissue at the amputation site is redundant which could generate this appearance. IMPRESSION: Status post  below-the-knee amputation with what appears to be soft tissue gas medial to the tibial osteotomy site however redundancy of the overlying soft tissues could cause this appearance. If there is clinical concern for soft tissue infection or osteomyelitis, MRI without and with contrast (renal function permitting) would be the study of choice to further evaluate. Electronically Signed   By: [name] Stanley M.D.   On: 05/02/2018 18:39   Mr Knee Left Wo Contrast  Result Date: 05/03/2018 CLINICAL DATA:  Left below-knee amputation stump infection. EXAM: MRI OF THE LEFT KNEE WITHOUT CONTRAST TECHNIQUE: Multiplanar, multisequence MR imaging of the knee was performed. No intravenous contrast was administered. COMPARISON:  Left knee x-rays from yesterday. FINDINGS: MENISCI Medial meniscus:  Intact. Lateral meniscus:  Intact. LIGAMENTS Cruciates:  Intact ACL and PCL. Collaterals: Medial collateral ligament is intact. Lateral collateral ligament complex is intact. CARTILAGE Patellofemoral:  Mild diffuse thinning without focal defect. Medial:  Mild diffuse thinning without focal defect. Lateral:  Mild diffuse thinning without focal defect. Joint: Small joint effusion. Normal Hoffa's fat. No plical thickening. Popliteal Fossa:  Tiny Baker cyst.  Intact popliteus tendon. Extensor Mechanism: Intact quadriceps tendon and patellar tendon. Intact medial and lateral patellar retinaculum. Intact MPFL. Bones: Prior below-knee amputation. Mild marrow edema at the tibial and fibular osteotomy sites with relatively preserved T1 marrow signal. No acute fracture or dislocation. No focal bone lesion. Other: Deep soft tissue wound along the medial stump margin extending to the medial tibia. Diffuse muscle edema around the amputation stump. No fluid collection. IMPRESSION: 1. Deep soft tissue wound along the medial stump margin extending close to the medial tibia. Mild marrow edema at the tibial and fibular osteotomy sites with relatively  preserved T1 marrow signal is favored to reflect reactive osteitis and less likely early osteomyelitis. 2. No abscess. Electronically Signed   By: Titus Dubin M.D.   On: 05/03/2018 10:33     ASSESSMENT AND PLAN:   68 year old female with past medical history of end-stage renal disease on hemodialysis, peripheral vascular disease status post bilateral BKA, recent infection of the left stump, glaucoma, hypertension, hyperlipidemia, diabetes, history of seizures who presented to the hospital due to foul-smelling drainage and suspected infection of the left BKA stump.  1.  Infection of the left BKA stump- MRI of the left leg showing some osteitis but less likely osteomyelitis. -Continue broad-spectrum IV antibiotics with vancomycin, Zosyn. Vascular surgery was considering AKA but the patient is refusing.  Continue current care with IV antibiotics.  We will get infectious disease consult.  2.  End-stage renal disease on hemodialysis-patient gets dialysis on Tuesday Thursday and Saturday. -Nephrology has been consulted.  Continue care as per them.    3.  Essential hypertension-continue carvedilol, Avapro, Imdur.   4.  Restless leg syndrome-continue Requip.  5.  Secondary hyperparathyroidism-continue Renvela.  6.  Hx of seizures-no acute seizure type activity.  Continue Keppra.  7.  Chronic hypotension- continue midodrine with dialysis  8.  Hypokalemia-cont. Supplementation and should correct with HD and will monitor.   9.  Glaucoma-continue timolol, alphagan eyedrops.   All the records are reviewed and case discussed with Care Management/Social Worker. Management plans discussed with  the patient, family and they are in agreement.  CODE STATUS: Full code  DVT Prophylaxis: Eliquis  TOTAL TIME TAKING CARE OF THIS PATIENT: 30 minutes.   POSSIBLE D/C IN 2-3 DAYS, DEPENDING ON CLINICAL CONDITION.   Henreitta Leber M.D on 05/04/2018 at 2:10 PM  Between 7am to 6pm - Pager -  [phone]  After 6pm go to [email or web address] - Proofreader  [facility]  Office  [phone]  CC: Primary care physician; Hanley Ben, NP

## 2018-05-04 NOTE — Progress Notes (Signed)
Per [facility] admissions coordinator at [facility] patient is a short term rehab resident at [facility] and can return when medically stable. Per Magda Paganini patient had a wound vac on left stump. RN aware of above.   [employer], LCSW [phone]

## 2018-05-05 DIAGNOSIS — H5462 Unqualified visual loss, left eye, normal vision right eye: Secondary | ICD-10-CM

## 2018-05-05 DIAGNOSIS — Z992 Dependence on renal dialysis: Secondary | ICD-10-CM

## 2018-05-05 DIAGNOSIS — Z885 Allergy status to narcotic agent status: Secondary | ICD-10-CM

## 2018-05-05 DIAGNOSIS — F339 Major depressive disorder, recurrent, unspecified: Secondary | ICD-10-CM

## 2018-05-05 DIAGNOSIS — N186 End stage renal disease: Secondary | ICD-10-CM

## 2018-05-05 DIAGNOSIS — Z888 Allergy status to other drugs, medicaments and biological substances status: Secondary | ICD-10-CM

## 2018-05-05 DIAGNOSIS — E1122 Type 2 diabetes mellitus with diabetic chronic kidney disease: Secondary | ICD-10-CM

## 2018-05-05 DIAGNOSIS — Z89511 Acquired absence of right leg below knee: Secondary | ICD-10-CM

## 2018-05-05 DIAGNOSIS — E1151 Type 2 diabetes mellitus with diabetic peripheral angiopathy without gangrene: Secondary | ICD-10-CM

## 2018-05-05 DIAGNOSIS — I2510 Atherosclerotic heart disease of native coronary artery without angina pectoris: Secondary | ICD-10-CM

## 2018-05-05 DIAGNOSIS — B965 Pseudomonas (aeruginosa) (mallei) (pseudomallei) as the cause of diseases classified elsewhere: Secondary | ICD-10-CM

## 2018-05-05 DIAGNOSIS — Z79899 Other long term (current) drug therapy: Secondary | ICD-10-CM

## 2018-05-05 DIAGNOSIS — Z951 Presence of aortocoronary bypass graft: Secondary | ICD-10-CM

## 2018-05-05 DIAGNOSIS — I132 Hypertensive heart and chronic kidney disease with heart failure and with stage 5 chronic kidney disease, or end stage renal disease: Secondary | ICD-10-CM

## 2018-05-05 DIAGNOSIS — T8781 Dehiscence of amputation stump: Secondary | ICD-10-CM

## 2018-05-05 DIAGNOSIS — L089 Local infection of the skin and subcutaneous tissue, unspecified: Secondary | ICD-10-CM

## 2018-05-05 DIAGNOSIS — I509 Heart failure, unspecified: Secondary | ICD-10-CM

## 2018-05-05 LAB — BASIC METABOLIC PANEL
Anion gap: 11 (ref 5–15)
BUN: 35 mg/dL — ABNORMAL HIGH (ref 8–23)
CO2: 27 mmol/L (ref 22–32)
Calcium: 10.4 mg/dL — ABNORMAL HIGH (ref 8.9–10.3)
Chloride: 102 mmol/L (ref 98–111)
Creatinine, Ser: 6.76 mg/dL — ABNORMAL HIGH (ref 0.44–1.00)
GFR calc Af Amer: 7 mL/min — ABNORMAL LOW (ref >60)
GFR calc non Af Amer: 6 mL/min — ABNORMAL LOW (ref >60)
Glucose, Bld: 165 mg/dL — ABNORMAL HIGH (ref 70–99)
Potassium: 3.7 mmol/L (ref 3.5–5.1)
Sodium: 140 mmol/L (ref 135–145)

## 2018-05-05 LAB — HEMOGLOBIN A1C
Hgb A1c MFr Bld: 6 % — ABNORMAL HIGH (ref 4.8–5.6)
Mean Plasma Glucose: 125.5 mg/dL

## 2018-05-05 LAB — GLUCOSE, CAPILLARY
Glucose-Capillary: 136 mg/dL — ABNORMAL HIGH (ref 70–99)
Glucose-Capillary: 105 mg/dL — ABNORMAL HIGH (ref 70–99)
Glucose-Capillary: 83 mg/dL (ref 70–99)
Glucose-Capillary: 144 mg/dL — ABNORMAL HIGH (ref 70–99)

## 2018-05-05 LAB — PHOSPHORUS: Phosphorus: 4.7 mg/dL — ABNORMAL HIGH (ref 2.5–4.6)

## 2018-05-05 MED ORDER — EPOETIN ALFA 10000 UNIT/ML IJ SOLN
4000.0000 [IU] | INTRAMUSCULAR | Status: DC
  Filled 2018-05-05 (×2): qty 1

## 2018-05-05 MED ORDER — VITAMIN C 500 MG PO TABS
500.0000 mg | ORAL_TABLET | Freq: Two times a day (BID) | ORAL | Status: DC
  Administered 2018-05-05 – 2018-05-12 (×9): 500 mg via ORAL
  Filled 2018-05-05 (×12): qty 1

## 2018-05-05 MED ORDER — LEVETIRACETAM 250 MG PO TABS
250.0000 mg | ORAL_TABLET | ORAL | Status: DC
  Administered 2018-05-05 – 2018-05-12 (×4): 250 mg via ORAL
  Filled 2018-05-05 (×4): qty 1

## 2018-05-05 MED ORDER — OCUVITE-LUTEIN PO CAPS
1.0000 | ORAL_CAPSULE | Freq: Every day | ORAL | Status: DC
  Administered 2018-05-06 – 2018-05-12 (×5): 1 via ORAL
  Filled 2018-05-05 (×7): qty 1

## 2018-05-05 MED ORDER — RENA-VITE PO TABS
1.0000 | ORAL_TABLET | Freq: Every day | ORAL | Status: DC
  Administered 2018-05-05 – 2018-05-10 (×4): 1 via ORAL
  Filled 2018-05-05 (×8): qty 1

## 2018-05-05 MED ORDER — IRBESARTAN 150 MG PO TABS
75.0000 mg | ORAL_TABLET | Freq: Every day | ORAL | Status: DC
  Administered 2018-05-06 – 2018-05-11 (×5): 75 mg via ORAL
  Filled 2018-05-05 (×5): qty 1

## 2018-05-05 NOTE — Progress Notes (Signed)
Post HD assessment    05/05/18 1736  Neurological  Level of Consciousness Alert  Orientation Level Oriented to person;Disoriented to place;Disoriented to time;Disoriented to situation  Respiratory  Respiratory Pattern Regular;Unlabored  Chest Assessment Chest expansion symmetrical  Cardiac  Pulse Irregular  ECG Monitor Yes  Cardiac Rhythm SB;NSR  Vascular  R Radial Pulse +2  L Radial Pulse +2  Edema Generalized;Facial;Right upper extremity;Left upper extremity;Right lower extremity;Left lower extremity  Integumentary  Integumentary (WDL) X  Skin Color Appropriate for ethnicity  Musculoskeletal  Musculoskeletal (WDL) X  Generalized Weakness Yes  Assistive Device None  GU Assessment  Genitourinary (WDL) X  Genitourinary Symptoms  (HD)  Psychosocial  Psychosocial (WDL) WDL

## 2018-05-05 NOTE — Progress Notes (Signed)
Pre HD assessment   05/05/18 1347  Vital Signs  Temp 98.7 F (37.1 C)  Temp Source Oral  Pulse Rate 60  Pulse Rate Source Monitor  Resp 18  BP (!) 156/57  BP Location Right Arm  BP Method Automatic  Patient Position (if appropriate) Lying  Oxygen Therapy  SpO2 99 %  O2 Device Room Air  Pain Assessment  Pain Scale CPOT  Critical Care Pain Observation Tool (CPOT)  Facial Expression 0  Body Movements 0  Muscle Tension 0  Compliance with ventilator (intubated pts.) N/A  Vocalization (extubated pts.) 0  CPOT Total 0  Dialysis Weight  Weight 49.3 kg  Type of Weight Pre-Dialysis  Time-Out for Hemodialysis  What Procedure? HD  Pt Identifiers(min of two) First/Last Name;MRN/Account#  Correct Site? Yes  Correct Side? Yes  Correct Procedure? Yes  Consents Verified? Yes  Rad Studies Available? N/A  Safety Precautions Reviewed? Yes  Engineer, civil (consulting) Number  (5A)  Station Number 3  UF/Alarm Test Passed  Conductivity: Meter 13.6  Conductivity: Machine  13.8  pH 7.4  Reverse Osmosis main  Normal Saline Lot Number [id number]  Dialyzer Lot Number [id number]  Disposable Set Lot Number [id number]  Machine Temperature 98.6 F (37 C)  Musician and Audible Yes  Blood Lines Intact and Secured Yes  Pre Treatment Patient Checks  Vascular access used during treatment Catheter  Hepatitis B Surface Antigen Results Negative  Date Hepatitis B Surface Antigen Drawn 11/26/17  Hepatitis B Surface Antibody  (>10)  Date Hepatitis B Surface Antibody Drawn 11/26/17  Hemodialysis Consent Verified Yes  Hemodialysis Standing Orders Initiated Yes  ECG (Telemetry) Monitor On Yes  Prime Ordered Normal Saline  Length of  DialysisTreatment -hour(s) 3.5 Hour(s)  Dialyzer Elisio 17H NR  Dialysate 2K, 2.5 Ca  Dialysis Anticoagulant None  Dialysate Flow Ordered 600  Blood Flow Rate Ordered 400 mL/min  Ultrafiltration Goal 1.5 Liters  Pre Treatment Labs Phosphorus  Dialysis Blood Pressure  Support Ordered Normal Saline  Education / Care Plan  Dialysis Education Provided Yes  Documented Education in Care Plan Yes  Hemodialysis Catheter Left Subclavian Double-lumen  Placement Date: 02/11/18   Placed prior to admission: No  Orientation: Left  Access Location: Subclavian  Hemodialysis Catheter Type: Double-lumen  Site Condition No complications  Blue Lumen Status Heparin locked  Red Lumen Status Heparin locked  Purple Lumen Status N/A  Dressing Type Gauze/Drain sponge  Dressing Status Clean;Dry;Intact  Drainage Description None

## 2018-05-05 NOTE — Progress Notes (Signed)
Pt is alert to self and knows she is in the hospital but is confused about the date. Pt also seems confused about utensils and stares at nurse when asked to take her medications. Pt has trouble following instructions. RN will re-assess mentation post dialysis.

## 2018-05-05 NOTE — Progress Notes (Signed)
RN spoke with Daughter [name] to update on POC. Per [name] the pt is confused at times. [name] verbalized that she agrees with the pt on not wanting surgical intervention at this time if possible.

## 2018-05-05 NOTE — Consult Note (Signed)
NAME: Stephanie Ellis  DOB: 1950/10/29  MRN: [id number]  Date/Time: 05/05/2018 11:45 PM [place] Subjective:  REASON FOR CONSULT: Infected stump of the left BKA. ? Stephanie Ellis is a 68 y.o. 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  As per patient she had a fall and the surgical incision on the left side dehisced and since then she has had a wound with foul-smelling discharge.  At some point she had a wound VAC which was removed because of the drainage. She does not have any fever or chills. She was seen by vascular who had recommended AKA but patient does not want to have one.  MRI was done and that does not show any bone involvement. I am asked to see the patient for antibiotic recommendation Past Medical History:  Diagnosis Date  . (HFpEF) heart failure with preserved ejection fraction ([place])    a. 01/2018 Echo: EF 50-55%, no rwma, Gr1 DD, triv AI, mild MR. Midly dil LA/RV, mod reduced RV fxn. Irreg thickening of TV w/ mobile echodensity that appears to arise from valve.  . Anemia   . Anorexia   . Bacteremia due to Pseudomonas   . Carotid arterial disease ([place])    a. 01/2017 Carotid U/S: RICA <24, LICA <40.  Marland Kitchen Complication of anesthesia    a. Pt reports h/o complication on 9 different occasions - ? hypotension/arrest.  . 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 01/2015 Cath [facility]): 70% LM, p-mLAD 50-60% (Resting FFR 0.75), mRCA 80-90%, ~40 Ost OM & D1-->CABG; b. 01/2016 Staged PCI of LCX x 2 and RCA.  Marland Kitchen ESRD (end stage renal disease) on dialysis ([place])    a. ESRD secondary to acute kidney failure s/p CABG-->PD  .  Essential hypertension   . GERD (gastroesophageal reflux disease)   . Heart murmur   . Hypercholesterolemia   . Myocardial infarction ([place]) 2016  . PVD (peripheral vascular disease) ([place])    a. 01/30/2018 PV Angio: Sev Left Popliteal dzs s/p PTA w/ 100m lutonix DEB w/ mech aspiration of the L popliteal, L AT, and Left tibioperoneal trunck and peroneal artery.  . 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  [facility]: Dr. BMarland KitchenHaithcock: CABG x 3, LIMA to LAD, SVG to RCA, SVG to OM3, EVH  . Sinusitis 2019  . Type II diabetes mellitus with complication (HCC)    CAD    Past Surgical History:  Procedure Laterality Date  . ABDOMINAL HYSTERECTOMY    . AMPUTATION Right 02/25/2018   Procedure: AMPUTATION BELOW KNEE;  Surgeon: SKatha Cabal MD;  Location: [facility];  Service: Vascular;  Laterality: Right;  . AMPUTATION Left 03/11/2018   Procedure: AMPUTATION BELOW KNEE;  Surgeon: DAlgernon Huxley MD;  Location: [facility];  Service: Vascular;  Laterality: Left;  . AMPUTATION Bilateral 04/13/2018   Procedure: REVISION OF BILATERAL AMPUTATIONS BELOW KNEE WITH WOUND VAC APPLICATIONS;  Surgeon: EEvaristo Bury MD;  Location: [facility];  Service: Vascular;  Laterality: Bilateral;  . AMPUTATION TOE Left 02/06/2018   Procedure: AMPUTATION GREAT TOE;  Surgeon: FSamara Deist DPM;  Location: [facility];  Service: Podiatry;  Laterality: Left;  . APPLICATION OF WOUND  VAC Left 04/13/2018   Procedure: APPLICATION OF WOUND VAC;  Surgeon: Evaristo Bury, MD;  Location: [facility];  Service: Vascular;  Laterality: Left;  . ARTERIOVENOUS GRAFT PLACEMENT  05/2016  . AV FISTULA PLACEMENT Left 05/30/2016   Procedure: ARTERIOVENOUS graft;  Surgeon: Algernon Huxley, MD;  Location: [facility];  Service: Vascular;  Laterality: Left;  . CAPD INSERTION N/A 10/30/2017   Procedure: LAPAROSCOPIC INSERTION CONTINUOUS AMBULATORY PERITONEAL DIALYSIS  (CAPD) CATHETER;  Surgeon: Algernon Huxley, MD;  Location: [facility];  Service: Vascular;  Laterality: N/A;    . CARDIAC CATHETERIZATION  01/2015   [facility]: Ost LM 70%, p-m LAD 50-60% (Rest FFR + @ 0.75), mRCA 80-90%, ostD1 40%, pOM1 40%  . CATARACT EXTRACTION W/PHACO Left 01/18/2016   Procedure: CATARACT EXTRACTION PHACO AND INTRAOCULAR LENS PLACEMENT (IOC);  Surgeon: Eulogio Bear, MD;  Location: [facility];  Service: Ophthalmology;  Laterality: Left;  [place] 1.05 AP% 15.5 CDE 10.16 Fluid Pack Lot # [id number]  . CATARACT EXTRACTION W/PHACO Right 08/01/2016   Procedure: CATARACT EXTRACTION PHACO AND INTRAOCULAR LENS PLACEMENT (IOC);  Surgeon: Eulogio Bear, MD;  Location: [facility];  Service: Ophthalmology;  Laterality: Right;  [place] 01:00.6 AP% 11.4 CDE 6.93  LOT # [id number]  . COLONOSCOPY    . CORONARY ANGIOPLASTY     SENTS 02/12/16  . CORONARY ARTERY BYPASS GRAFT  03/28/15    [facility]: Dr. Waldemar Dickens: LIMA to LAD, SVG to RCA, SVG to OM3, EVH  . DIALYSIS/PERMA CATHETER INSERTION N/A 02/11/2018   Procedure: DIALYSIS/PERMA CATHETER INSERTION;  Surgeon: Algernon Huxley, MD;  Location: [place] [facility];  Service: Cardiovascular;  Laterality: N/A;  . DIALYSIS/PERMA CATHETER REMOVAL Right 02/04/2018   Procedure: DIALYSIS/PERMA CATHETER REMOVAL;  Surgeon: Katha Cabal, MD;  Location: [place] [facility];  Service: Cardiovascular;  Laterality: Right;  . ESOPHAGOGASTRODUODENOSCOPY (EGD) WITH PROPOFOL N/A 10/24/2016   Procedure: ESOPHAGOGASTRODUODENOSCOPY (EGD) WITH PROPOFOL;  Surgeon: [name], MD;  Location: [facility];  Service: Endoscopy;  Laterality: N/A;  . EYE SURGERY Bilateral    cataract surgery  . EYE SURGERY     drains for glaucoma  . INSERTION EXPRESS TUBE SHUNT Right 08/01/2016   Procedure: INSERTION EXPRESS TUBE SHUNT;  Surgeon: Eulogio Bear, MD;  Location: [facility];  Service: Ophthalmology;  Laterality: Right;  . INSERTION OF AHMED VALVE Left 08/15/2016   Procedure: INSERTION OF AHMED VALVE;  Surgeon: Eulogio Bear, MD;  Location: [facility];  Service: Ophthalmology;  Laterality:  Left;  . INSERTION OF DIALYSIS CATHETER    . LOWER EXTREMITY ANGIOGRAPHY Right 02/19/2018   Procedure: Lower Extremity Angiography;  Surgeon: Algernon Huxley, MD;  Location: [place] [facility];  Service: Cardiovascular;  Laterality: Right;  . LOWER EXTREMITY ANGIOGRAPHY Left 01/30/2018   Procedure: Lower Extremity Angiography;  Surgeon: Katha Cabal, MD;  Location: [place] [facility];  Service: Cardiovascular;  Laterality: Left;  . REMOVAL OF A DIALYSIS CATHETER N/A 02/25/2018   Procedure: REMOVAL OF A DIALYSIS CATHETER;  Surgeon: Katha Cabal, MD;  Location: [facility];  Service: Vascular;  Laterality: N/A;  . TEMPORARY DIALYSIS CATHETER N/A 02/06/2018   Procedure: TEMPORARY DIALYSIS CATHETER;  Surgeon: Katha Cabal, MD;  Location: [place] [facility];  Service: Cardiovascular;  Laterality: N/A;  . TRANSTHORACIC ECHOCARDIOGRAM  January 2017    EF 60-65%. GR 2 DD. Mild degenerative mitral valve disease but no prolapse or regurgitation. Mild left atrial dilation. Mild to moderate LVH. Pericardial effusion gone  .  UPPER EXTREMITY ANGIOGRAPHY Left 06/27/2016   Procedure: Upper Extremity Angiography;  Surgeon: Algernon Huxley, MD;  Location: [place] [facility];  Service: Cardiovascular;  Laterality: Left;  . UPPER EXTREMITY ANGIOGRAPHY Left 08/22/2016   Procedure: Upper Extremity Angiography;  Surgeon: Algernon Huxley, MD;  Location: [place] [facility];  Service: Cardiovascular;  Laterality: Left;  Marland Kitchen VASCULAR SURGERY    Social history Non-smoker Currently in a facility  Family History  Problem Relation Age of Onset  . Diabetes Mellitus II Mother   . Heart failure Mother   . Pancreatic cancer Father    Allergies  Allergen Reactions  . Chlorthalidone Anaphylaxis, Itching and Rash  . Fentanyl Rash  . Midazolam Rash  . Ace Inhibitors Other (See Comments)    Reaction:  Hyperkalemia, agitation   . Angiotensin Receptor Blockers Other (See Comments)    Reaction:  Hyperkalemia,  agitation   . Norvasc [Amlodipine] Itching and Rash  . Phenergan [Promethazine Hcl] Anxiety    "antsy, can't sit still"    Current Facility-Administered Medications  Medication Dose Route Frequency Provider Last Rate Last Dose  . 0.9 %  sodium chloride infusion  250 mL Intravenous PRN Dustin Flock, MD      . acetaminophen (TYLENOL) tablet 650 mg  650 mg Oral Q6H PRN Dustin Flock, MD   650 mg at 05/05/18 1214  . apixaban (ELIQUIS) tablet 2.5 mg  2.5 mg Oral BID Dustin Flock, MD   2.5 mg at 05/05/18 2100  . aspirin EC tablet 81 mg  81 mg Oral Daily Dustin Flock, MD   81 mg at 05/05/18 0840  . atorvastatin (LIPITOR) tablet 80 mg  80 mg Oral QHS Dustin Flock, MD   80 mg at 05/05/18 2054  . brimonidine (ALPHAGAN) 0.2 % ophthalmic solution 1 drop  1 drop Both Eyes TID Dustin Flock, MD   1 drop at 05/05/18 2053  . calcitRIOL (ROCALTROL) capsule 0.5 mcg  0.5 mcg Oral Daily Dustin Flock, MD   0.5 mcg at 05/05/18 0843  . carvedilol (COREG) tablet 6.25 mg  6.25 mg Oral BID WC Dustin Flock, MD   6.25 mg at 05/05/18 1717  . docusate sodium (COLACE) capsule 100 mg  100 mg Oral BID Dustin Flock, MD   100 mg at 05/05/18 2054  . [START ON 05/07/2018] epoetin alfa (EPOGEN,PROCRIT) injection 4,000 Units  4,000 Units Intravenous Q T,Th,Sa-HD Lateef, Munsoor, MD      . famotidine (PEPCID) tablet 20 mg  20 mg Oral Daily Henreitta Leber, MD   20 mg at 05/05/18 0840  . feeding supplement (NEPRO CARB STEADY) liquid 237 mL  237 mL Oral BID BM Sainani, Belia Heman, MD   237 mL at 05/05/18 1100  . folic acid (FOLVITE) tablet 1 mg  1 mg Oral Daily Dustin Flock, MD   1 mg at 05/05/18 0840  . hydrALAZINE (APRESOLINE) injection 10 mg  10 mg Intravenous Q6H PRN Dustin Flock, MD   10 mg at 05/02/18 2057  . insulin aspart (novoLOG) injection 0-9 Units  0-9 Units Subcutaneous TID WC Dustin Flock, MD   1 Units at 05/05/18 [phone]  . [START ON 05/06/2018] irbesartan (AVAPRO) tablet 75 mg  75 mg Oral  Daily Henreitta Leber, MD      . isosorbide mononitrate (IMDUR) 24 hr tablet 30 mg  30 mg Oral Daily Dustin Flock, MD   30 mg at 05/05/18 1717  . levETIRAcetam (KEPPRA) tablet 250 mg  250 mg Oral Once per  day on Tue Thu Sat Pernell Dupre, [place]   250 mg at 05/05/18 9024  . levETIRAcetam (KEPPRA) tablet 500 mg  500 mg Oral BID Dustin Flock, MD   500 mg at 05/05/18 2211  . midodrine (PROAMATINE) tablet 10 mg  10 mg Oral Q T,Th,Sa-HD Hallaji, Sheema M, RPH   10 mg at 05/05/18 1213  . mirtazapine (REMERON) tablet 7.5 mg  7.5 mg Oral QHS Dustin Flock, MD   7.5 mg at 05/05/18 2054  . multivitamin (RENA-VIT) tablet 1 tablet  1 tablet Oral QHS Henreitta Leber, MD   1 tablet at 05/05/18 2101  . [START ON 05/06/2018] multivitamin-lutein (OCUVITE-LUTEIN) capsule 1 capsule  1 capsule Oral Daily Sainani, Belia Heman, MD      . nitroGLYCERIN (NITROSTAT) SL tablet 0.4 mg  0.4 mg Sublingual Q5 min PRN Dustin Flock, MD      . omega-3 acid ethyl esters (LOVAZA) capsule 2 g  2 capsule Oral Daily Dustin Flock, MD   2 g at 05/05/18 0840  . ondansetron (ZOFRAN) tablet 4 mg  4 mg Oral Q6H PRN Dustin Flock, MD       Or  . ondansetron [facility]) injection 4 mg  4 mg Intravenous Q6H PRN Dustin Flock, MD      . piperacillin-tazobactam (ZOSYN) IVPB 3.375 g  3.375 g Intravenous Q12H Dustin Flock, MD 12.5 mL/hr at 05/05/18 2106 3.375 g at 05/05/18 2106  . polyvinyl alcohol (LIQUIFILM TEARS) 1.4 % ophthalmic solution 1 drop  1 drop Both Eyes BID Dustin Flock, MD   1 drop at 05/05/18 2102  . rOPINIRole (REQUIP) tablet 0.25 mg  0.25 mg Oral QHS Dustin Flock, MD   0.25 mg at 05/05/18 2211  . sevelamer carbonate (RENVELA) tablet 800 mg  800 mg Oral TID WC Dustin Flock, MD   800 mg at 05/05/18 1838  . sodium chloride flush (NS) 0.9 % injection 3 mL  3 mL Intravenous Q12H Dustin Flock, MD   3 mL at 05/05/18 2108  . sodium chloride flush (NS) 0.9 % injection 3 mL  3 mL Intravenous PRN Dustin Flock,  MD      . sodium hypochlorite (DAKIN'S 1/4 STRENGTH) topical solution   Irrigation BID Cafasso, Danielle E, MD      . timolol (TIMOPTIC) 0.5 % ophthalmic solution 1 drop  1 drop Both Eyes TID Dustin Flock, MD   1 drop at 05/05/18 2053  . traMADol (ULTRAM) tablet 50 mg  50 mg Oral Q8H PRN Dustin Flock, MD      . vancomycin (VANCOCIN) 500 mg in sodium chloride 0.9 % 100 mL IVPB  500 mg Intravenous Q T,Th,Sa-HD Henreitta Leber, MD 100 mL/hr at 05/05/18 1629 500 mg at 05/05/18 1629  . vitamin B-12 (CYANOCOBALAMIN) tablet 1,000 mcg  1,000 mcg Oral Daily Dustin Flock, MD   1,000 mcg at 05/05/18 0840  . vitamin C (ASCORBIC ACID) tablet 500 mg  500 mg Oral BID Henreitta Leber, MD   500 mg at 05/05/18 0973     Abtx:  Anti-infectives (From admission, onward)   Start     Dose/Rate Route Frequency Ordered Stop   05/05/18 1200  vancomycin (VANCOCIN) IVPB 500 mg/100 ml premix  Status:  Discontinued     500 mg 100 mL/hr over 60 Minutes Intravenous Every T-Th-Sa (Hemodialysis) 05/02/18 2027 05/04/18 1534   05/05/18 1200  vancomycin (VANCOCIN) 500 mg in sodium chloride 0.9 % 100 mL IVPB     500 mg 100 mL/hr  over 60 Minutes Intravenous Every T-Th-Sa (Hemodialysis) 05/04/18 1534     05/03/18 1000  piperacillin-tazobactam (ZOSYN) IVPB 3.375 g     3.375 g 12.5 mL/hr over 240 Minutes Intravenous Every 12 hours 05/02/18 2027     05/02/18 1915  piperacillin-tazobactam (ZOSYN) IVPB 3.375 g  Status:  Discontinued     3.375 g 100 mL/hr over 30 Minutes Intravenous  Once 05/02/18 1909 05/03/18 1116   05/02/18 1745  vancomycin (VANCOCIN) IVPB 1000 mg/200 mL premix     1,000 mg 200 mL/hr over 60 Minutes Intravenous  Once 05/02/18 1739 05/02/18 1943      REVIEW OF SYSTEMS:  Const: negative fever, negative chills, weight loss secondary to BKA's Eyes: Left eye poor vision, some redness just chronic ENT: negative coryza, negative sore throat Resp: negative cough, hemoptysis, dyspnea Cards: negative for  chest pain, palpitations, l GU: No urine is on dialysis GI: Negative for abdominal pain, diarrhea, bleeding, constipation Skin: negative for rash and pruritus Heme: negative for easy bruising and gum/nose bleeding MS: negative for myalgias, arthralgias, back pain and muscle weakness Neurolo:negative for headaches, dizziness, vertigo, memory problems  Psych: Depression Endocrine: negative for thyroid, diabetes Allergy/Immunology-multiple medication allergies listed ?  Objective:  VITALS:  BP (!) 160/85 (BP Location: Right Arm)   Pulse 63   Temp 98.4 F (36.9 C) (Oral)   Resp 16   Ht 5' 4"  (1.626 m)   Wt 47.1 kg   SpO2 100%   BMI 17.82 kg/m  PHYSICAL EXAM:  General: Alert, cooperative, no distress, appears stated age.  Pale Head: Normocephalic, without obvious abnormality, atraumatic. Eyes: Conjunctivae clear, anicteric sclerae. Pupils are equal ENT Nares normal. No drainage or sinus tenderness. Lips, mucosa, and tongue normal. No Thrush Neck: Supple, symmetrical, no adenopathy, thyroid: non tender no carotid bruit and no JVD. Back: Did not examine  lungs: Clear to auscultation bilaterally. No Wheezing or Rhonchi. No rales. Heart: Regular rate and rhythm, no murmur, rub or gallop. Abdomen: Soft, non-tender,not distended. Bowel sounds normal. No masses Extremities: Bilateral BKA. Right stump is healed very well Left stump  a wound present which covers the entire length .  Slough, eschar and foul-smelling discharge present with areas of red tissue    skin: No rashes or lesions. Or bruising Lymph: Cervical, supraclavicular normal. Neurologic: Grossly non-focal Pertinent Labs Lab Results CBC    Component Value Date/Time   WBC 9.0 05/04/2018 0240   RBC 3.44 (L) 05/04/2018 0240   HGB 9.7 (L) 05/04/2018 0240   HCT 33.1 (L) 05/04/2018 0240   PLT 260 05/04/2018 0240   MCV 96.2 05/04/2018 0240   MCH 28.2 05/04/2018 0240   MCHC 29.3 (L) 05/04/2018 0240   RDW 18.7 (H)  05/04/2018 0240   LYMPHSABS 1.8 05/02/2018 1820   MONOABS 0.9 05/02/2018 1820   EOSABS 0.2 05/02/2018 1820   BASOSABS 0.0 05/02/2018 1820    CMP Latest Ref Rng & Units 05/05/2018 05/04/2018 05/03/2018  Glucose 70 - 99 mg/dL 165(H) - 150(H)  BUN 8 - 23 mg/dL 35(H) - 10  Creatinine 0.44 - 1.00 mg/dL 6.76(H) - 2.93(H)  Sodium 135 - 145 mmol/L 140 - 136  Potassium 3.5 - 5.1 mmol/L 3.7 2.9(L) 2.8(L)  Chloride 98 - 111 mmol/L 102 - 99  CO2 22 - 32 mmol/L 27 - 27  Calcium 8.9 - 10.3 mg/dL 10.4(H) - 9.5  Total Protein 6.5 - 8.1 g/dL - - -  Total Bilirubin 0.3 - 1.2 mg/dL - - -  Alkaline Phos  38 - 126 U/L - - -  AST 15 - 41 U/L - - -  ALT 0 - 44 U/L - - -      Microbiology: Recent Results (from the past 240 hour(s))  Aerobic/Anaerobic Culture (surgical/deep wound)     Status: None (Preliminary result)   Collection Time: 05/02/18  6:20 PM  Result Value Ref Range Status   Specimen Description   Final    WOUND Performed at [facility], [address] [place], [place] [address]    Special Requests   Final    Normal Performed at [facility], [address] [place], [place] [address]    Gram Stain   Final    FEW WBC PRESENT, PREDOMINANTLY PMN ABUNDANT GRAM VARIABLE ROD    Culture   Final    MODERATE PSEUDOMONAS AERUGINOSA SUSCEPTIBILITIES TO FOLLOW HOLDING FOR POSSIBLE ANAEROBE Performed at [facility], [place] [address] [place], [place] [address]    Report Status PENDING  Incomplete  Blood culture (routine x 2)     Status: None (Preliminary result)   Collection Time: 05/02/18  6:20 PM  Result Value Ref Range Status   Specimen Description BLOOD LFA  Final   Special Requests   Final    BOTTLES DRAWN AEROBIC AND ANAEROBIC Blood Culture adequate volume   Culture   Final    NO GROWTH 3 DAYS Performed at [facility], [address], [place], [place] [address]    Report Status PENDING  Incomplete  Blood culture (routine x 2)     Status: None  (Preliminary result)   Collection Time: 05/02/18  6:24 PM  Result Value Ref Range Status   Specimen Description BLOOD LWRIST  Final   Special Requests   Final    BOTTLES DRAWN AEROBIC AND ANAEROBIC Blood Culture adequate volume   Culture   Final    NO GROWTH 3 DAYS Performed at [facility], [address], [place], [place] [address]    Report Status PENDING  Incomplete  MRSA PCR Screening     Status: None   Collection Time: 05/03/18  1:04 AM  Result Value Ref Range Status   MRSA by PCR NEGATIVE NEGATIVE Final    Comment:        The GeneXpert MRSA Assay (FDA approved for NASAL specimens only), is one component of a comprehensive MRSA colonization surveillance program. It is not intended to diagnose MRSA infection nor to guide or monitor treatment for MRSA infections. Performed at [facility], [address], [place], [place] [address]    IMAGING RESULTS: ?MRI reviewed Impression/Recommendation 68 y.o. 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  As per patient she had a fall and the surgical incision on the left side dehisced and since then she has had a wound with foul-smelling discharge.? ? ?Infected stump of the left BKA.  As underlying bone is not involved this wound needs debridement to remove the slough and eschar and then new wound VAC placement to help with healing.  Otherwise antibiotics per se is not going to help.  She is currently on vancomycin and Zosyn.  The superficial culture that was taken from the ED has Pseudomonas which can be colonizing the  wound.  Hence a deep culture is needed after debridement Explained to patient that this needs to be done otherwise antibiotics will  not be helpful. Used a Dakin's solution to reduce bioburden.  Right BKA site healthy  End-stage renal disease on dialysis  Peripheral arterial disease  Diabetes mellitus management as per primary team  Coronary artery disease status post CABG CHF on carvedilol, Avapro, isosorbide mononitrate, Midodrin  Depression on mirtazapine ___________________________________________________ Discussed with patient, requesting provider Note:  This document was prepared using Dragon voice recognition software and may include unintentional dictation errors.

## 2018-05-05 NOTE — Progress Notes (Signed)
Pt's dialysate bath changed r/t a potassium of 3.7, MD aware.    05/05/18 1430  Vital Signs  Pulse Rate (!) 59  Pulse Rate Source Monitor  Resp (!) 9  BP (!) 179/64  BP Location Right Arm  BP Method Automatic  Patient Position (if appropriate) Lying  Oxygen Therapy  SpO2 98 %  O2 Device Room Air  Pre Treatment Patient Checks  Dialysate 3K, 2.5 Ca (r/t pt's potassium 3.7, MD aware)  During Hemodialysis Assessment  Blood Flow Rate (mL/min) 400 mL/min  Arterial Pressure (mmHg) -220 mmHg  Venous Pressure (mmHg) 150 mmHg  Transmembrane Pressure (mmHg) 50 mmHg  Ultrafiltration Rate (mL/min) 570 mL/min  Dialysate Flow Rate (mL/min) 600 ml/min  Conductivity: Machine  13.6  HD Safety Checks Performed Yes  Intra-Hemodialysis Comments Progressing as prescribed (407)

## 2018-05-05 NOTE — Progress Notes (Signed)
HD tx end    05/05/18 1732  Vital Signs  Pulse Rate 65  Pulse Rate Source Monitor  Resp 17  BP (!) 218/85  BP Location Right Arm  BP Method Automatic  Patient Position (if appropriate) Lying  Oxygen Therapy  SpO2 98 %  O2 Device Room Air  During Hemodialysis Assessment  Dialysis Fluid Bolus Normal Saline  Bolus Amount (mL) 250 mL  Intra-Hemodialysis Comments Tx completed

## 2018-05-05 NOTE — Progress Notes (Signed)
ID Pt with b/l BKA- left BKA stump dehiscence- bone not ivolved- pt not keen on AKA- as bone not involved we could try to debride the wound and place wound vac. Continue zosyn/vanco

## 2018-05-05 NOTE — Progress Notes (Signed)
[facility] [place] NAME: Stephanie Ellis    MR#:  [id number]  DATE OF BIRTH:  21-Feb-1951  SUBJECTIVE:   Pt. Still refusing Left AKA or Stump debridement/wound Vac as she believes that wound Vac caused her wound infection.  Pt. Has no fever, N/V or any other associated symptoms.    REVIEW OF SYSTEMS:    Review of Systems  Constitutional: Negative for chills and fever.  HENT: Negative for congestion and tinnitus.   Eyes: Negative for blurred vision and double vision.  Respiratory: Negative for cough, shortness of breath and wheezing.   Cardiovascular: Negative for chest pain, orthopnea and PND.  Gastrointestinal: Negative for abdominal pain, diarrhea, nausea and vomiting.  Genitourinary: Negative for dysuria and hematuria.  Neurological: Negative for dizziness, sensory change and focal weakness.  All other systems reviewed and are negative.   Nutrition: Rneal/Carb Tolerating Diet: Yes  DRUG ALLERGIES:   Allergies  Allergen Reactions  . Chlorthalidone Anaphylaxis, Itching and Rash  . Fentanyl Rash  . Midazolam Rash  . Ace Inhibitors Other (See Comments)    Reaction:  Hyperkalemia, agitation   . Angiotensin Receptor Blockers Other (See Comments)    Reaction:  Hyperkalemia, agitation   . Norvasc [Amlodipine] Itching and Rash  . Phenergan [Promethazine Hcl] Anxiety    "antsy, can't sit still"    VITALS:  Blood pressure (!) 190/68, pulse 61, temperature 98.7 F (37.1 C), temperature source Oral, resp. rate 17, height 5' 4"  (1.626 m), weight 49.3 kg, SpO2 98 %.  PHYSICAL EXAMINATION:   Physical Exam  GENERAL:  68 y.o.-year-old patient lying in bed in NAD.  EYES: Pupils equal, round, reactive to light and accommodation. No scleral icterus. Extraocular muscles intact.  HEENT: Head atraumatic, normocephalic. Oropharynx and nasopharynx clear.  NECK:  Supple, no jugular venous distention. No thyroid enlargement, no tenderness.    LUNGS: Normal breath sounds bilaterally, no wheezing, rales, rhonchi. No use of accessory muscles of respiration.  CARDIOVASCULAR: S1, S2 normal. No murmurs, rubs, or gallops.  ABDOMEN: Soft, nontender, nondistended. Bowel sounds present. No organomegaly or mass.  EXTREMITIES: No cyanosis, clubbing or edema b/l.  B/l BKA with left stump dressing in place.   NEUROLOGIC: Cranial nerves II through XII are intact. No focal Motor or sensory deficits b/l.   PSYCHIATRIC: The patient is alert and oriented x 3.  SKIN: No obvious rash, lesion, or ulcer.    LABORATORY PANEL:   CBC Recent Labs  Lab 05/04/18 0240  WBC 9.0  HGB 9.7*  HCT 33.1*  PLT 260   ------------------------------------------------------------------------------------------------------------------  Chemistries  Recent Labs  Lab 05/02/18 1820  05/05/18 0415  NA 137   < > 140  K 2.7*   < > 3.7  CL 99   < > 102  CO2 28   < > 27  GLUCOSE 98   < > 165*  BUN 6*   < > 35*  CREATININE 2.05*   < > 6.76*  CALCIUM 9.1   < > 10.4*  AST 19  --   --   ALT 9  --   --   ALKPHOS 106  --   --   BILITOT 0.5  --   --    < > = values in this interval not displayed.   ------------------------------------------------------------------------------------------------------------------  Cardiac Enzymes No results for input(s): TROPONINI in the last 168 hours. ------------------------------------------------------------------------------------------------------------------  RADIOLOGY:  No results found.   ASSESSMENT AND PLAN:  68 year old female with past medical history of end-stage renal disease on hemodialysis, peripheral vascular disease status post bilateral BKA, recent infection of the left stump, glaucoma, hypertension, hyperlipidemia, diabetes, history of seizures who presented to the hospital due to foul-smelling drainage and suspected infection of the left BKA stump.  1.  Infection of the left BKA stump- MRI of the left  leg showing some osteitis but less likely osteomyelitis. -Continue broad-spectrum IV antibiotics with vancomycin, Zosyn. Patient is refusing AKA or debridement with wound VAC placement as she believes that the wound VAC actually caused the infection.  Explained to the patient that she could get much sicker and become septic and also could die if infection is not treated appropriately but she adamantly refuses to have any surgical intervention presently. -Continue local wound care as per wound care nurse along with IV antibiotics for now. -Await further infectious disease input.  2.  End-stage renal disease on hemodialysis-patient gets dialysis on Tuesday Thursday and Saturday. -Nephrology has been consulted.  Continue care as per them.    3.  Essential hypertension-continue carvedilol, Avapro, Imdur.   4.  Restless leg syndrome-continue Requip.  5.  Secondary hyperparathyroidism-continue Renvela.  6.  Hx of seizures-no acute seizure type activity.  Continue Keppra.  7.  Chronic hypotension- continue midodrine with dialysis  8.  Hypokalemia-cont. Supplementation and should correct with HD and will monitor.   9.  Glaucoma-continue timolol, alphagan eyedrops.   All the records are reviewed and case discussed with Care Management/Social Worker. Management plans discussed with the patient, family and they are in agreement.  CODE STATUS: Full code  DVT Prophylaxis: Eliquis  TOTAL TIME TAKING CARE OF THIS PATIENT: 30 minutes.   POSSIBLE D/C IN 2-3 DAYS, DEPENDING ON CLINICAL CONDITION.   Henreitta Leber M.D on 05/05/2018 at 4:04 PM  Between 7am to 6pm - Pager - [phone]  After 6pm go to [email or web address] - Proofreader  [facility]  Office  [phone]  CC: Primary care physician; Hanley Ben, NP

## 2018-05-05 NOTE — Progress Notes (Signed)
HD tx start    05/05/18 1356  Vital Signs  Pulse Rate (!) 58  Pulse Rate Source Monitor  Resp 15  BP (!) 179/61  BP Location Right Arm  BP Method Automatic  Patient Position (if appropriate) Lying  Oxygen Therapy  SpO2 96 %  O2 Device Room Air  During Hemodialysis Assessment  Blood Flow Rate (mL/min) 400 mL/min  Arterial Pressure (mmHg) -210 mmHg  Venous Pressure (mmHg) 140 mmHg  Transmembrane Pressure (mmHg) 50 mmHg  Ultrafiltration Rate (mL/min) 570 mL/min  Dialysate Flow Rate (mL/min) 600 ml/min  Conductivity: Machine  13.6  HD Safety Checks Performed Yes  Dialysis Fluid Bolus Normal Saline  Bolus Amount (mL) 250 mL  Intra-Hemodialysis Comments Tx initiated  Hemodialysis Catheter Left Subclavian Double-lumen  Placement Date: 02/11/18   Placed prior to admission: No  Orientation: Left  Access Location: Subclavian  Hemodialysis Catheter Type: Double-lumen  Blue Lumen Status Infusing  Red Lumen Status Infusing

## 2018-05-05 NOTE — Progress Notes (Signed)
[facility], [place] 05/05/18  Subjective:  Left above-the-knee amputation has been recommended however patient has declined. Patient due for hemodialysis today. Orders have been prepared.   Objective:  Vital signs in last 24 hours:  Temp:  [97.9 F (36.6 C)-98.6 F (37 C)] 98.6 F (37 C) (01/14 0800) Pulse Rate:  [63-69] 63 (01/14 0800) Resp:  [16-19] 16 (01/14 0800) BP: (117-182)/(52-63) 182/52 (01/14 0800) SpO2:  [99 %-100 %] 100 % (01/14 0800) Weight:  [48.8 kg] 48.8 kg (01/14 0528)  Weight change:  Filed Weights   05/02/18 1732 05/05/18 0528  Weight: 47.2 kg 48.8 kg    Intake/Output:    Intake/Output Summary (Last 24 hours) at 05/05/2018 1346 Last data filed at 05/05/2018 1050 Gross per 24 hour  Intake 270.93 ml  Output -  Net 270.93 ml     Physical Exam: General:  No acute distress, laying in the bed  HEENT  moist oral mucous membranes  Pulm/lungs  room air, clear to auscultation  CVS/Heart  no rub or gallop  Abdomen:   Soft, nontender  Extremities:  Bilateral BKA, left stump dressed  Neurologic:  Alert, able to answer all questions appropriately  Skin:  No acute rashes  Access:  Left IJ PermCath       Basic Metabolic Panel:  Recent Labs  Lab 05/02/18 1820 05/03/18 0437 05/04/18 0240 05/05/18 0415  NA 137 136  --  140  K 2.7* 2.8* 2.9* 3.7  CL 99 99  --  102  CO2 28 27  --  27  GLUCOSE 98 150*  --  165*  BUN 6* 10  --  35*  CREATININE 2.05* 2.93*  --  6.76*  CALCIUM 9.1 9.5  --  10.4*     CBC: Recent Labs  Lab 05/02/18 1820 05/03/18 0437 05/04/18 0240  WBC 9.2 10.1 9.0  NEUTROABS 6.2  --   --   HGB 11.4* 11.4* 9.7*  HCT 38.4 38.7 33.1*  MCV 97.0 96.0 96.2  PLT 300 307 260     No results found for: HEPBSAG, HEPBSAB, HEPBIGM    Microbiology:  Recent Results (from the past 240 hour(s))  Aerobic/Anaerobic Culture (surgical/deep wound)     Status: None (Preliminary result)   Collection Time:  05/02/18  6:20 PM  Result Value Ref Range Status   Specimen Description   Final    WOUND Performed at [facility], [address], [place], [place] [address]    Special Requests   Final    Normal Performed at [facility], [address] [place], [place] [address]    Gram Stain   Final    FEW WBC PRESENT, PREDOMINANTLY PMN ABUNDANT GRAM VARIABLE ROD    Culture   Final    MODERATE PSEUDOMONAS AERUGINOSA SUSCEPTIBILITIES TO FOLLOW HOLDING FOR POSSIBLE ANAEROBE Performed at [facility], [place] [address], [place], [place] [address]    Report Status PENDING  Incomplete  Blood culture (routine x 2)     Status: None (Preliminary result)   Collection Time: 05/02/18  6:20 PM  Result Value Ref Range Status   Specimen Description BLOOD LFA  Final   Special Requests   Final    BOTTLES DRAWN AEROBIC AND ANAEROBIC Blood Culture adequate volume   Culture   Final    NO GROWTH 3 DAYS Performed at [facility], [address] [place],  [address]    Report Status PENDING  Incomplete  Blood culture (  routine x 2)     Status: None (Preliminary result)   Collection Time: 05/02/18  6:24 PM  Result Value Ref Range Status   Specimen Description BLOOD LWRIST  Final   Special Requests   Final    BOTTLES DRAWN AEROBIC AND ANAEROBIC Blood Culture adequate volume   Culture   Final    NO GROWTH 3 DAYS Performed at [facility], [address] [place], [place] [address]    Report Status PENDING  Incomplete  MRSA PCR Screening     Status: None   Collection Time: 05/03/18  1:04 AM  Result Value Ref Range Status   MRSA by PCR NEGATIVE NEGATIVE Final    Comment:        The GeneXpert MRSA Assay (FDA approved for NASAL specimens only), is one component of a comprehensive MRSA colonization surveillance program. It is not intended to diagnose MRSA infection nor to guide or monitor treatment for MRSA infections. Performed at [facility], [address] [place], [place] [address]     Coagulation Studies: No results for input(s): LABPROT, INR in the last 72 hours.  Urinalysis: No results for input(s): COLORURINE, LABSPEC, PHURINE, GLUCOSEU, HGBUR, BILIRUBINUR, KETONESUR, PROTEINUR, UROBILINOGEN, NITRITE, LEUKOCYTESUR in the last 72 hours.  Invalid input(s): APPERANCEUR    Imaging: No results found.   Medications:   . sodium chloride    . piperacillin-tazobactam (ZOSYN)  IV 12.5 mL/hr at 05/05/18 1050  . vancomycin     . apixaban  2.5 mg Oral BID  . aspirin EC  81 mg Oral Daily  . atorvastatin  80 mg Oral QHS  . brimonidine  1 drop Both Eyes TID  . calcitRIOL  0.5 mcg Oral Daily  . carvedilol  6.25 mg Oral BID WC  . docusate sodium  100 mg Oral BID  . famotidine  20 mg Oral Daily  . feeding supplement (NEPRO CARB STEADY)  237 mL Oral BID BM  . folic acid  1 mg Oral Daily  . insulin aspart  0-9 Units Subcutaneous TID WC  . [START ON 05/06/2018] irbesartan  75 mg Oral Daily  . isosorbide mononitrate  30 mg Oral Daily  . levETIRAcetam  250 mg Oral Once per day on Tue Thu Sat  . levETIRAcetam  500 mg Oral BID  . midodrine  10 mg Oral Q T,Th,Sa-HD  . mirtazapine  7.5 mg Oral QHS  . multivitamin with minerals  1 tablet Oral Daily  . omega-3 acid ethyl esters  2 capsule Oral Daily  . polyvinyl alcohol  1 drop Both Eyes BID  . rOPINIRole  0.25 mg Oral QHS  . sevelamer carbonate  800 mg Oral TID WC  . sodium chloride flush  3 mL Intravenous Q12H  . sodium hypochlorite   Irrigation BID  . timolol  1 drop Both Eyes TID  . vitamin B-12  1,000 mcg Oral Daily   sodium chloride, acetaminophen, hydrALAZINE, nitroGLYCERIN, ondansetron **OR** ondansetron (ZOFRAN) IV, sodium chloride flush, traMADol  Assessment/ Plan:  68 y.o. African-American female with end stage renal disease on hemodialysis, hypertension, congestive heart failure, coronary artery disease status post CABG, diabetes mellitus type II insulin  dependent, GERD, peripheral vascular disease status post right BKA, history of endocarditis and osteomyelitis  [facility] [place] [facility] hemodialysis IJ permcath  *End-stage renal disease Patient due for dialysis today.  Orders have been prepared.  *Anemia of chronic kidney disease Hemoglobin 9.7.  Start the patient on Epogen 4000 units IV  with dialysis.  *Secondary hyperparathyroidism Check serum phosphorus today.   LOS: 3 Stephanie Ellis 1/14/20201:46 PM  Kennett, Yancey  Note: This note was prepared with Dragon dictation. Any transcription errors are unintentional

## 2018-05-05 NOTE — Progress Notes (Signed)
Pre HD assessment    05/05/18 1348  Neurological  Level of Consciousness Alert  Orientation Level Oriented to person;Disoriented to place;Disoriented to time;Disoriented to situation  Respiratory  Respiratory Pattern Regular;Unlabored  Chest Assessment Chest expansion symmetrical  Cardiac  Pulse Irregular  ECG Monitor Yes  Cardiac Rhythm SB;NSR  Vascular  R Radial Pulse +2  L Radial Pulse +2  Edema Generalized;Facial;Right upper extremity;Left upper extremity;Right lower extremity;Left lower extremity  Integumentary  Integumentary (WDL) X  Skin Color Appropriate for ethnicity  Musculoskeletal  Musculoskeletal (WDL) X  Generalized Weakness Yes  Assistive Device None  GU Assessment  Genitourinary (WDL) X  Genitourinary Symptoms  (HD)  Psychosocial  Psychosocial (WDL) WDL

## 2018-05-05 NOTE — Progress Notes (Signed)
Post HD Assessment. Pt tolerated tx well without c/o or complication. Pt did experience HTN with a systolic reading as high as 221, RN/MD aware, RN came to give pt medication while pt was on HD tx. Net UF 1502, goal met.    05/05/18 1737  Vital Signs  Temp 98.2 F (36.8 C)  Temp Source Oral  Pulse Rate 64  Pulse Rate Source Monitor  Resp 19  BP (!) 221/69 (RN/MD aware)  BP Location Right Arm  BP Method Automatic  Patient Position (if appropriate) Lying  Oxygen Therapy  SpO2 99 %  O2 Device Room Air  Dialysis Weight  Weight 47.1 kg  Type of Weight Post-Dialysis  Post-Hemodialysis Assessment  Rinseback Volume (mL) 250 mL  KECN 77.5 V  Dialyzer Clearance Lightly streaked  Duration of HD Treatment -hour(s) 3.5 hour(s)  Hemodialysis Intake (mL) 600 mL  UF Total -Machine (mL) 2102 mL  Net UF (mL) 1502 mL  Tolerated HD Treatment Yes  Education / Care Plan  Dialysis Education Provided Yes  Documented Education in Care Plan Yes  Hemodialysis Catheter Left Subclavian Double-lumen  Placement Date: 02/11/18   Placed prior to admission: No  Orientation: Left  Access Location: Subclavian  Hemodialysis Catheter Type: Double-lumen  Site Condition No complications  Blue Lumen Status Heparin locked  Red Lumen Status Heparin locked  Purple Lumen Status N/A  Catheter fill solution Heparin 1000 units/ml  Catheter fill volume (Arterial) 1.7 cc  Catheter fill volume (Venous) 1.7  Dressing Type Biopatch  Dressing Status Clean;Dry;Intact  Interventions New dressing  Drainage Description None  Dressing Change Due 05/12/18  Post treatment catheter status Capped and Clamped

## 2018-05-05 NOTE — Evaluation (Signed)
Physical Therapy Evaluation Patient Details Name: Preslei Blakley MRN: [id number] DOB: 26-Dec-1950 Today's Date: 05/05/2018   History of Present Illness  68 y/o F who presented from [facility] (previously from [facility]) with infection to L BKA. PMH includes CABG x3, MI, CHF, CAD, HTN, HLD, PVD, hx siezures, ESRD on HD T/R/Sat, and R BKA.  Clinical Impression  Pt was seen for mobility post OT evaluation, and noted she is a bit more clear but very confused about some prior function details.  Pt is a resident of SNF recently and will recommend she return to this environment, esp as she is not clearly going to be a prosthetic candidate in the near future.  Her plan is to work on stretches and strengthening, continue to practice lateral scoot maneuvers and progress with her sitting balance as tolerated.  Will defer information about prosthetics to the next venue of care.    Follow Up Recommendations SNF    Equipment Recommendations  None recommended by PT    Recommendations for Other Services       Precautions / Restrictions Precautions Precautions: Fall Precaution Comments: NWB on BLE's Required Braces or Orthoses: (pt has no equipment to splint extension on knees with her) Restrictions Weight Bearing Restrictions: Yes RLE Weight Bearing: Non weight bearing LLE Weight Bearing: Non weight bearing Other Position/Activity Restrictions: NWBing bilaterally 2/2 bilat BKA      Mobility  Bed Mobility Overal bed mobility: Needs Assistance Bed Mobility: Supine to Sit;Sit to Supine     Supine to sit: Mod assist Sit to supine: Mod assist   General bed mobility comments: pt is slow to follow requests to move, cannot motor plan the movement  Transfers Overall transfer level: Needs assistance Equipment used: 1 person hand held assist(bed pad) Transfers: Lateral/Scoot Transfers          Lateral/Scoot Transfers: Max assist General transfer comment: pt could not understand how to  move directionally, attempted to pivot with legs toward [facility]  Ambulation/Gait             General Gait Details: B BK amp with no prostheses yet  Stairs            Wheelchair Mobility    Modified Rankin (Stroke Patients Only)       Balance Overall balance assessment: Needs assistance Sitting-balance support: Bilateral upper extremity supported Sitting balance-Leahy Scale: Poor Sitting balance - Comments: pt leans over unpredictably                                     Pertinent Vitals/Pain Pain Assessment: 0-10 Pain Score: 10-Worst pain ever Pain Location: LLE Pain Descriptors / Indicators: Operative site guarding Pain Intervention(s): Monitored during session;Repositioned(RN was told about the pain rating but pt was nearly asleep )    Home Living Family/patient expects to be discharged to:: Skilled nursing facility Living Arrangements: Other (Comment)(current SNF resident)               Additional Comments: pt had been living in a mobile home prior to her stay at [facility]    Prior Function Level of Independence: Independent with assistive device(s)         Comments: used a WC for mobility prior to SNF     Hand Dominance   Dominant Hand: Right    Extremity/Trunk Assessment   Upper Extremity Assessment Upper Extremity Assessment: Generalized weakness    Lower  Extremity Assessment Lower Extremity Assessment: Generalized weakness    Cervical / Trunk Assessment Cervical / Trunk Assessment: Kyphotic  Communication   Communication: No difficulties  Cognition Arousal/Alertness: Lethargic Behavior During Therapy: Flat affect Overall Cognitive Status: No family/caregiver present to determine baseline cognitive functioning                                 General Comments: oriented to self but cannot follow instructions for mobility well, usually off      General Comments General comments (skin integrity,  edema, etc.): pt was seen for general mobility and noted her inability to motor plan, specifically to control sit balance and organize scooting to [facility]    Exercises     Assessment/Plan    PT Assessment Patient needs continued PT services  PT Problem List Decreased strength;Decreased range of motion;Decreased activity tolerance;Decreased balance;Decreased mobility;Decreased coordination;Decreased cognition;Decreased knowledge of use of DME;Decreased safety awareness;Pain;Decreased skin integrity       PT Treatment Interventions DME instruction;Functional mobility training;Therapeutic activities;Therapeutic exercise;Balance training;Neuromuscular re-education;Cognitive remediation;Patient/family education    PT Goals (Current goals can be found in the Care Plan section)  Acute Rehab PT Goals Patient Stated Goal: pt confused, unable to state goal PT Goal Formulation: Patient unable to participate in goal setting Time For Goal Achievement: 05/19/18 Potential to Achieve Goals: Fair    Frequency Min 2X/week   Barriers to discharge Inaccessible home environment;Decreased caregiver support home alone in a trailer at baseline    Co-evaluation               AM-PAC PT "6 Clicks" Mobility  Outcome Measure Help needed turning from your back to your side while in a flat bed without using bedrails?: A Lot Help needed moving from lying on your back to sitting on the side of a flat bed without using bedrails?: A Lot Help needed moving to and from a bed to a chair (including a wheelchair)?: A Lot Help needed standing up from a chair using your arms (e.g., wheelchair or bedside chair)?: Total Help needed to walk in hospital room?: Total Help needed climbing 3-5 steps with a railing? : Total 6 Click Score: 9    End of Session   Activity Tolerance: Patient tolerated treatment well Patient left: in bed;with call bell/phone within reach;with bed alarm set Nurse Communication: Mobility  status PT Visit Diagnosis: Muscle weakness (generalized) (M62.81);Pain Pain - Right/Left: Left Pain - part of body: Knee    Time: 1130-1156 PT Time Calculation (min) (ACUTE ONLY): 26 min   Charges:   PT Evaluation $PT Eval Moderate Complexity: 1 Mod PT Treatments $Neuromuscular Re-education: 8-22 mins       Ramond Dial 05/05/2018, 2:54 PM  Mee Hives, PT MS Acute Rehab Dept. Number: [place] and [place]

## 2018-05-05 NOTE — Care Management Important Message (Signed)
Important Message  Patient Details  Name: Stephanie Ellis MRN: [id number] Date of Birth: 17-Sep-1950   Medicare Important Message Given:  Yes    Juliann Pulse A Morelia Cassells 05/05/2018, 11:09 AM

## 2018-05-05 NOTE — Evaluation (Signed)
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 PMH includes CABG x3, MI, CHF, CAD, HTN, HLD, PVD, hx siezures, ESRD on HD T/R/Sat, and R BKA.   Clinical Impression   Pt seen for OT evaluation this date. Prior to hospital admission, pt was [facility] Per previous chart review, pt was modified independent from w/c level prior to rehab and previous hospitalization. Pt oriented to self only, unable to follow commands, and requires significantly increased time to process and max verbal cues to initiate movement during session. Per previous chart review, pt seems to have poor cognition after anesthesia and prior to HD. Pt is scheduled for HD today, per RN. Pt attempted to sit EOB, and with cues able to bring BLE close to the EOB but then pt unable to complete on her own, requiring max assist. This is likely due to decreased cognition at this time. Will continue to assess mobility and ADL tasks next session to get more accurate picture of pt's current functional status. Pt unsafe to attempt transfers this date. Currently pt demonstrates impairments in bathing/dressing/toileting and functional mobility requiring mod-max assist for ADL from bed level. Pt would benefit from skilled OT to address noted impairments and functional limitations (see below for any additional details) in order to maximize safety and independence while minimizing falls risk and caregiver burden.  Upon hospital discharge, recommend pt discharge to [facility] to continue therapy.    Follow Up Recommendations  SNF    Equipment Recommendations  Other (comment)(TBD)    Recommendations for Other Services       Precautions / Restrictions Precautions Precautions: Fall Restrictions Weight Bearing Restrictions: Yes RLE  Weight Bearing: Non weight bearing LLE Weight Bearing: Non weight bearing Other Position/Activity Restrictions: NWBing bilaterally 2/2 bilat BKA      Mobility Bed Mobility Overal bed mobility: Needs Assistance             General bed mobility comments: pt attempted sup>sit, able to move BLE towards EOB, but then stopped and declined to attempt further, pt with difficulty following commands  Transfers                 General transfer comment: unsafe to attempt    Balance                                           ADL either performed or assessed with clinical judgement   ADL Overall ADL's : Needs assistance/impaired Eating/Feeding: Sitting;Set up;Supervision/ safety   Grooming: Sitting;Set up;Supervision/safety   Upper Body Bathing: Moderate assistance;Bed level   Lower Body Bathing: Bed level;Moderate assistance   Upper Body Dressing : Sitting;Minimal assistance   Lower Body Dressing: Bed level;Moderate assistance                 General ADL Comments: generally MOD A for ADL from bed level 2/2 cognition and decr strength     Vision Patient Visual Report: No change from baseline       Perception     Praxis      Pertinent Vitals/Pain Pain Assessment: No/denies pain Pain Intervention(s): Premedicated before session;Repositioned;Monitored during session     Hand Dominance Right   Extremity/Trunk Assessment Upper  Extremity Assessment Upper Extremity Assessment: Generalized weakness(at least 3+/5)   Lower Extremity Assessment Lower Extremity Assessment: Generalized weakness(at least 3+/5)       Communication Communication Communication: No difficulties   Cognition Arousal/Alertness: Awake/alert Behavior During Therapy: WFL for tasks assessed/performed Overall Cognitive Status: No family/caregiver present to determine baseline cognitive functioning                                 General Comments: pt  oriented to self only, significantly increased time to process, initiate movement, difficulty following simple commands   General Comments  dressing to L stump intact, no drainage noted from dressing; mild foul odor noted    Exercises     Shoulder Instructions      Home Living Family/patient expects to be discharged to:: Skilled nursing facility(to continue rehab)                                        Prior Functioning/Environment          Comments: prior to recent [facility], per chart review (pt poor historian), pt was modified independent w/ dressing, w/c transfers, and self propelling w/c        OT Problem List: Decreased strength;Decreased knowledge of use of DME or AE;Decreased range of motion;Decreased cognition;Impaired balance (sitting and/or standing);Decreased safety awareness      OT Treatment/Interventions: Self-care/ADL training;Balance training;Therapeutic exercise;Therapeutic activities;Cognitive remediation/compensation;DME and/or AE instruction;Patient/family education    OT Goals(Current goals can be found in the care plan section) Acute Rehab OT Goals Patient Stated Goal: pt confused, unable to state goal OT Goal Formulation: Patient unable to participate in goal setting Time For Goal Achievement: 05/19/18 ADL Goals Pt Will Perform Lower Body Dressing: with min assist;bed level Pt Will Transfer to Toilet: with min assist;bedside commode;with transfer board;anterior/posterior transfer  OT Frequency: Min 2X/week   Barriers to D/C:            Co-evaluation              AM-PAC OT "6 Clicks" Daily Activity     Outcome Measure Help from another person eating meals?: A Little Help from another person taking care of personal grooming?: A Little Help from another person toileting, which includes using toliet, bedpan, or urinal?: A Lot Help from another person bathing (including washing, rinsing, drying)?: A Lot Help from another person to  put on and taking off regular upper body clothing?: A Little Help from another person to put on and taking off regular lower body clothing?: A Lot 6 Click Score: 15   End of Session    Activity Tolerance: Patient tolerated treatment well Patient left: in bed;with call bell/phone within reach;with bed alarm set  OT Visit Diagnosis: Other abnormalities of gait and mobility (R26.89);Muscle weakness (generalized) (M62.81);Other symptoms and signs involving cognitive function                Time: 1005-1018 OT Time Calculation (min): 13 min Charges:  OT General Charges $OT Visit: 1 Visit OT Evaluation $OT Eval Moderate Complexity: 1 Mod  Jeni Salles, MPH, MS, OTR/L ascom [phone] 05/05/18, 11:10 AM

## 2018-05-05 NOTE — Consult Note (Signed)
[facility] Nurse wound consult note Reason for Consult: left stump infection Wound type: surgical Pressure Injury POA: NA Measurement:7cm x 18cm with depth undetermined due to the presence of necrotic tissue Wound bed: 60% necrotic with foul odor, 40 % pale red, nongranulating Drainage (amount, consistency, odor) small to moderate amount of light tan drainage with strong odor Periwound:intact Dressing procedure/placement/frequency: Noted are Vascular orders for 3 days of 1/4 strength sodium hypochlorite solution and systemic antibiotics.  Agree with team that additional surgery would be most prudent POC, and also note that patient is refusing further surgical intervention. Following expiration of Dakin's solution order, I will implement twice daily NS dressings.  [place] nursing team will not follow, but will remain available to this patient, the nursing and medical teams.  Please re-consult if needed. Thanks, Maudie Flakes, MSN, RN, [place], Arther Abbott  Pager# [phone]

## 2018-05-05 NOTE — Progress Notes (Signed)
Initial Nutrition Assessment  DOCUMENTATION CODES:   Not applicable  INTERVENTION:  Recommend liberalizing diet. Patient's potassium and phosphorus are typically WNL if not low, so it is not appropriate to restrict these at this time.  Provide Nepro Shake po BID, each supplement provides 425 kcal and 19 grams protein.  Provide Magic cup TID with meals, each supplement provides 290 kcal and 9 grams of protein.  Provide Rena-vite QHS.  Provide Ocuvite daily for wound healing (provides zinc, vitamin A, vitamin C, Vitamin E, copper, and selenium).  Provide vitamin C 500 mg BID.  Will discontinue regular MVI daily as this is not appropriate for patients with ESRD on HD.  Bowel regimen per MD. Patient had a type 1 BM yesterday, which is consistent with constipation.  NUTRITION DIAGNOSIS:   Increased nutrient needs related to wound healing, catabolic illness(ESRD on HD) as evidenced by estimated needs.  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   -Vascular surgery is recommending AKA but patient is refusing.  Unable to meet with patient today as she was down in HD. Patient is well-known to our service over multiple admissions. She has had decreased appetite and oral intake at baseline for months now. Weight has trended down over the past few months, but patient also had two bilateral BKAs in that same time frame. Will continue to monitor weight  trend.  Medications reviewed and include: Eliquis, Colace 100 mg BID, Epogen 4000 units during HD, famotidine, folic acid 1 mg daily, Novolog 0-9 units TID, Keppra, Remeron 7.5 mg QHS, MVI daily, Lovaza 2 capsules daily, Renvela 800 mg TID with meals, vitamin B12 1000 micrograms daily, Zosyn, vancomycin.  Labs reviewed: CBG 105-166, Potassium 3.7 (was previously low), BUN 35, Creatinine 6.76. Last phosphorus was 3.3 on 04/16/2018.  Discussed with RN.  NUTRITION - FOCUSED PHYSICAL EXAM:  Unable to complete as patient not in room. Will obtain on follow-up.  Diet Order:   Diet Order            Diet renal/carb modified with fluid restriction Diet-HS Snack? Nothing; Fluid restriction: 1200 mL Fluid; Room service appropriate? Yes; Fluid consistency: Thin  Diet effective now             EDUCATION NEEDS:   Not appropriate for education at this time  Skin:  Skin Assessment: Skin Integrity Issues:(open incision left leg; closed incision right leg)  Last BM:  05/04/2018 - medium type 1  Height:   Ht Readings from Last 1 Encounters:  05/02/18 5' 4"  (1.626 m)   Weight:   Wt Readings from Last 1 Encounters:  05/05/18 49.3 kg   Ideal Body Weight:  48 kg(adjusted for b/l BKAs)  BMI:  Body mass index is 18.66 kg/m.  Estimated Nutritional Needs:   Kcal:  1400-1600  Protein:  75-85 grams  Fluid:  UOP + 1 L  Willey Blade, MS, RD, LDN Office: [phone] Pager: [phone] After  Hours/Weekend Pager: [phone]

## 2018-05-06 LAB — GLUCOSE, CAPILLARY
Glucose-Capillary: 126 mg/dL — ABNORMAL HIGH (ref 70–99)
Glucose-Capillary: 131 mg/dL — ABNORMAL HIGH (ref 70–99)
Glucose-Capillary: 154 mg/dL — ABNORMAL HIGH (ref 70–99)
Glucose-Capillary: 160 mg/dL — ABNORMAL HIGH (ref 70–99)

## 2018-05-06 NOTE — Progress Notes (Signed)
Pharmacy Antibiotic Note  Stephanie Ellis is a 68 y.o. female admitted on 05/02/2018 with osteo/wound infection.  Pharmacy has been consulted for Zosyn and vancomycin dosing.  Plan: 1. Zosyn 3.375 g IV Q12H EI 2. Vancomycin 1000 mg IV x 1 in ED as loading dose followed by vancomycin 500 mg IV Q-dialysis T-T-S. Will need trough prior to 3rd HD session (goal 15-25 mcg/ml before start of HD)  Vancomycin course: 1/11 vancomycin 1g x1 (loading dose) 1/14 vancomycin 500 mg with HD   Height: 5' 4"  (162.6 cm) Weight: 103 lb 13.4 oz (47.1 kg) IBW/kg (Calculated) : 54.7  Temp (24hrs), Avg:98.3 F (36.8 C), Min:97.9 F (36.6 C), Max:98.7 F (37.1 C)  Recent Labs  Lab 05/02/18 1820 05/02/18 1837 05/03/18 0437 05/04/18 0240 05/05/18 0415  WBC 9.2  --  10.1 9.0  --   CREATININE 2.05*  --  2.93*  --  6.76*  LATICACIDVEN  --  1.16  --   --   --     Estimated Creatinine Clearance: 6 mL/min (A) (by C-G formula based on SCr of 6.76 mg/dL (H)).    Allergies  Allergen Reactions  . Chlorthalidone Anaphylaxis, Itching and Rash  . Fentanyl Rash  . Midazolam Rash  . Ace Inhibitors Other (See Comments)    Reaction:  Hyperkalemia, agitation   . Angiotensin Receptor Blockers Other (See Comments)    Reaction:  Hyperkalemia, agitation   . Norvasc [Amlodipine] Itching and Rash  . Phenergan [Promethazine Hcl] Anxiety    "antsy, can't sit still"    Antimicrobials this admission: 1/11 Vancomycin >>  1/12 Zosyn >>  Dose adjustments this admission:   Microbiology results:  BCx: NGTD  WCx: PsA  MRSA PCR: neg  Thank you for allowing pharmacy to be a part of this patient's care.  Rocky Morel, PharmD, BCPS Clinical Pharmacist 05/06/2018 11:05 AM

## 2018-05-06 NOTE — Progress Notes (Addendum)
[facility] NAME: Stephanie Ellis    MR#:  [id number]  DATE OF BIRTH:  1950/05/19  SUBJECTIVE:   Pt. Continues to refuse any surgical intervention likely AKA or even surgical debridement or wound VAC placement.  Remains afebrile and hemodynamically stable.  No other complaints.  REVIEW OF SYSTEMS:    Review of Systems  Constitutional: Negative for chills and fever.  HENT: Negative for congestion and tinnitus.   Eyes: Negative for blurred vision and double vision.  Respiratory: Negative for cough, shortness of breath and wheezing.   Cardiovascular: Negative for chest pain, orthopnea and PND.  Gastrointestinal: Negative for abdominal pain, diarrhea, nausea and vomiting.  Genitourinary: Negative for dysuria and hematuria.  Neurological: Negative for dizziness, sensory change and focal weakness.  All other systems reviewed and are negative.   Nutrition: Rneal/Carb Tolerating Diet: Yes  DRUG ALLERGIES:   Allergies  Allergen Reactions  . Chlorthalidone Anaphylaxis, Itching and Rash  . Fentanyl Rash  . Midazolam Rash  . Ace Inhibitors Other (See Comments)    Reaction:  Hyperkalemia, agitation   . Angiotensin Receptor Blockers Other (See Comments)    Reaction:  Hyperkalemia, agitation   . Norvasc [Amlodipine] Itching and Rash  . Phenergan [Promethazine Hcl] Anxiety    "antsy, can't sit still"    VITALS:  Blood pressure (!) 178/48, pulse (!) 59, temperature 97.9 F (36.6 C), resp. rate 18, height 5' 4"  (1.626 m), weight 47.1 kg, SpO2 95 %.  PHYSICAL EXAMINATION:   Physical Exam  GENERAL:  68 y.o.-year-old patient lying in bed in NAD.  EYES: Pupils equal, round, reactive to light and accommodation. No scleral icterus. Extraocular muscles intact.  HEENT: Head atraumatic, normocephalic. Oropharynx and nasopharynx clear.  NECK:  Supple, no jugular venous distention. No thyroid enlargement, no tenderness.  LUNGS: Normal  breath sounds bilaterally, no wheezing, rales, rhonchi. No use of accessory muscles of respiration.  CARDIOVASCULAR: S1, S2 normal. No murmurs, rubs, or gallops.  ABDOMEN: Soft, nontender, nondistended. Bowel sounds present. No organomegaly or mass.  EXTREMITIES: No cyanosis, clubbing or edema b/l.  B/l BKA with left stump dressing in place with foul smelling tan drainage.      NEUROLOGIC: Cranial nerves II through XII are intact. No focal Motor or sensory deficits b/l.   PSYCHIATRIC: The patient is alert and oriented x 3.  SKIN: No obvious rash, lesion, or ulcer.    LABORATORY PANEL:   CBC Recent Labs  Lab 05/04/18 0240  WBC 9.0  HGB 9.7*  HCT 33.1*  PLT 260   ------------------------------------------------------------------------------------------------------------------  Chemistries  Recent Labs  Lab 05/02/18 1820  05/05/18 0415  NA 137   < > 140  K 2.7*   < > 3.7  CL 99   < > 102  CO2 28   < > 27  GLUCOSE 98   < > 165*  BUN 6*   < > 35*  CREATININE 2.05*   < > 6.76*  CALCIUM 9.1   < > 10.4*  AST 19  --   --   ALT 9  --   --   ALKPHOS 106  --   --   BILITOT 0.5  --   --    < > = values in this interval not displayed.   ------------------------------------------------------------------------------------------------------------------  Cardiac Enzymes No results for input(s): TROPONINI in the last 168 hours. ------------------------------------------------------------------------------------------------------------------  RADIOLOGY:  No results found.   ASSESSMENT AND PLAN:  68 year old female with past medical history of end-stage renal disease on hemodialysis, peripheral vascular disease status post bilateral BKA, recent infection of the left stump, glaucoma, hypertension, hyperlipidemia, diabetes, history of seizures who presented to the hospital due to foul-smelling drainage and suspected infection of the left BKA stump.  1.  Infection of the left BKA  stump- MRI of the left leg showing some osteitis but less likely osteomyelitis. -Continue broad-spectrum IV antibiotics Zosyn. Patient is refusing AKA or debridement with wound VAC placement as she believes that the wound VAC actually caused the infection.  Explained to the patient that she could get much sicker and become septic and also could die if infection is not treated appropriately but she adamantly refuses to have any surgical intervention presently.  Explained this to pt's daughter today who will talk to the patient about need for treatment.   -Continue local wound care as per wound care nurse along with IV antibiotics for now. Wound cultures showing Pseudomonas and continue IV Zosyn for now.  2.  End-stage renal disease on hemodialysis-patient gets dialysis on Tuesday Thursday and Saturday. -Nephrology has been consulted.  Continue care as per them.    3.  Essential hypertension-continue carvedilol, Avapro, Imdur.  - BP stable.   4.  Restless leg syndrome-continue Requip.  5.  Secondary hyperparathyroidism-continue Renvela.  6.  Hx of seizures-no acute seizure type activity.  Continue Keppra.  7.  Chronic hypotension- continue midodrine with dialysis  8.  Hypokalemia- improved w/ HD.    9.  Glaucoma- continue timolol, alphagan eyedrops.  Greater than 50% time spent in discussing and coordinating care with the patient and also with the daughter over the phone regarding her options of treatment.  All the records are reviewed and case discussed with Care Management/Social Worker. Management plans discussed with the patient, family and they are in agreement.  CODE STATUS: Full code  DVT Prophylaxis: Eliquis  TOTAL TIME TAKING CARE OF THIS PATIENT: 40 minutes.   POSSIBLE D/C IN 2-3 DAYS, DEPENDING ON CLINICAL CONDITION.   Henreitta Leber M.D on 05/06/2018 at 3:05 PM  Between 7am to 6pm - Pager - [phone]  After 6pm go to [email or web address] - Proofreader  [facility]  Office  [phone]  CC: Primary care physician; Hanley Ben, NP

## 2018-05-06 NOTE — Progress Notes (Signed)
Clinical Education officer, museum (CSW) made [employer] at [facility] aware that patient will need IV ABX and a wound vac per chart. Per Magda Paganini she will order the wound vac.   [employer], LCSW [phone]

## 2018-05-06 NOTE — Progress Notes (Signed)
[place], [place] 05/06/18  Subjective:  Patient seen at bedside. Due for dialysis again tomorrow.    Objective:  Vital signs in last 24 hours:  Temp:  [97.9 F (36.6 C)-98.7 F (37.1 C)] 97.9 F (36.6 C) (01/15 0750) Pulse Rate:  [57-66] 59 (01/15 0750) Resp:  [9-27] 18 (01/15 0750) BP: (156-221)/(48-85) 178/48 (01/15 0750) SpO2:  [95 %-100 %] 95 % (01/15 0750) Weight:  [47.1 kg-49.3 kg] 47.1 kg (01/14 1737)  Weight change: 0.538 kg Filed Weights   05/05/18 0528 05/05/18 1347 05/05/18 1737  Weight: 48.8 kg 49.3 kg 47.1 kg    Intake/Output:    Intake/Output Summary (Last 24 hours) at 05/06/2018 1204 Last data filed at 05/06/2018 1052 Gross per 24 hour  Intake 75.31 ml  Output 1502 ml  Net -1426.69 ml     Physical Exam: General:  No acute distress, laying in the bed  HEENT  moist oral mucous membranes  Pulm/lungs  room air, clear to auscultation  CVS/Heart  no rub or gallop  Abdomen:   Soft, nontender  Extremities:  Bilateral BKA, left stump dressed  Neurologic:  Alert, able to answer all questions appropriately  Skin:  No acute rashes  Access:  Left IJ PermCath       Basic Metabolic Panel:  Recent Labs  Lab 05/02/18 1820 05/03/18 0437 05/04/18 0240 05/05/18 0415 05/05/18 1406  NA 137 136  --  140  --   K 2.7* 2.8* 2.9* 3.7  --   CL 99 99  --  102  --   CO2 28 27  --  27  --   GLUCOSE 98 150*  --  165*  --   BUN 6* 10  --  35*  --   CREATININE 2.05* 2.93*  --  6.76*  --   CALCIUM 9.1 9.5  --  10.4*  --   PHOS  --   --   --   --  4.7*     CBC: Recent Labs  Lab 05/02/18 1820 05/03/18 0437 05/04/18 0240  WBC 9.2 10.1 9.0  NEUTROABS 6.2  --   --   HGB 11.4* 11.4* 9.7*  HCT 38.4 38.7 33.1*  MCV 97.0 96.0 96.2  PLT 300 307 260     No results found for: HEPBSAG, HEPBSAB, HEPBIGM    Microbiology:  Recent Results (from the past 240 hour(s))  Aerobic/Anaerobic Culture (surgical/deep wound)     Status: None  (Preliminary result)   Collection Time: 05/02/18  6:20 PM  Result Value Ref Range Status   Specimen Description   Final    WOUND Performed at [facility], [address], [place], [place] [address]    Special Requests   Final    Normal Performed at [facility], [address] [place], [place] [address]    Gram Stain   Final    FEW WBC PRESENT, PREDOMINANTLY PMN ABUNDANT GRAM VARIABLE ROD Performed at [facility], [place] [address] [place], [place] [address]    Culture   Final    MODERATE PSEUDOMONAS AERUGINOSA NO ANAEROBES ISOLATED; CULTURE IN PROGRESS FOR 5 DAYS    Report Status PENDING  Incomplete   Organism ID, Bacteria PSEUDOMONAS AERUGINOSA  Final      Susceptibility   Pseudomonas aeruginosa - MIC*    CEFTAZIDIME 16 INTERMEDIATE Intermediate     CIPROFLOXACIN <=0.25 SENSITIVE Sensitive     GENTAMICIN <=1 SENSITIVE Sensitive     IMIPENEM 1  SENSITIVE Sensitive     CEFEPIME INTERMEDIATE Intermediate     * MODERATE PSEUDOMONAS AERUGINOSA  Blood culture (routine x 2)     Status: None (Preliminary result)   Collection Time: 05/02/18  6:20 PM  Result Value Ref Range Status   Specimen Description BLOOD LFA  Final   Special Requests   Final    BOTTLES DRAWN AEROBIC AND ANAEROBIC Blood Culture adequate volume   Culture   Final    NO GROWTH 4 DAYS Performed at [facility], [address], [place], [place] [address]    Report Status PENDING  Incomplete  Blood culture (routine x 2)     Status: None (Preliminary result)   Collection Time: 05/02/18  6:24 PM  Result Value Ref Range Status   Specimen Description BLOOD LWRIST  Final   Special Requests   Final    BOTTLES DRAWN AEROBIC AND ANAEROBIC Blood Culture adequate volume   Culture   Final    NO GROWTH 4 DAYS Performed at [facility], [address], [place], [place] [address]    Report Status PENDING  Incomplete  MRSA PCR Screening     Status: None   Collection Time:  05/03/18  1:04 AM  Result Value Ref Range Status   MRSA by PCR NEGATIVE NEGATIVE Final    Comment:        The GeneXpert MRSA Assay (FDA approved for NASAL specimens only), is one component of a comprehensive MRSA colonization surveillance program. It is not intended to diagnose MRSA infection nor to guide or monitor treatment for MRSA infections. Performed at [facility], [address] [place], [place] [address]     Coagulation Studies: No results for input(s): LABPROT, INR in the last 72 hours.  Urinalysis: No results for input(s): COLORURINE, LABSPEC, PHURINE, GLUCOSEU, HGBUR, BILIRUBINUR, KETONESUR, PROTEINUR, UROBILINOGEN, NITRITE, LEUKOCYTESUR in the last 72 hours.  Invalid input(s): APPERANCEUR    Imaging: No results found.   Medications:   . sodium chloride    . piperacillin-tazobactam (ZOSYN)  IV 12.5 mL/hr at 05/06/18 1052  . vancomycin 500 mg (05/05/18 1629)   . apixaban  2.5 mg Oral BID  . aspirin EC  81 mg Oral Daily  . atorvastatin  80 mg Oral QHS  . brimonidine  1 drop Both Eyes TID  . calcitRIOL  0.5 mcg Oral Daily  . carvedilol  6.25 mg Oral BID WC  . docusate sodium  100 mg Oral BID  . [START ON 05/07/2018] epoetin (EPOGEN/PROCRIT) injection  4,000 Units Intravenous Q T,Th,Sa-HD  . famotidine  20 mg Oral Daily  . feeding supplement (NEPRO CARB STEADY)  237 mL Oral BID BM  . folic acid  1 mg Oral Daily  . insulin aspart  0-9 Units Subcutaneous TID WC  . irbesartan  75 mg Oral Daily  . isosorbide mononitrate  30 mg Oral Daily  . levETIRAcetam  250 mg Oral Once per day on Tue Thu Sat  . levETIRAcetam  500 mg Oral BID  . midodrine  10 mg Oral Q T,Th,Sa-HD  . mirtazapine  7.5 mg Oral QHS  . multivitamin  1 tablet Oral QHS  . multivitamin-lutein  1 capsule Oral Daily  . omega-3 acid ethyl esters  2 capsule Oral Daily  . polyvinyl alcohol  1 drop Both Eyes BID  . rOPINIRole  0.25 mg Oral QHS  . sevelamer carbonate  800 mg Oral TID WC   . sodium chloride flush  3 mL Intravenous  Q12H  . timolol  1 drop Both Eyes TID  . vitamin B-12  1,000 mcg Oral Daily  . vitamin C  500 mg Oral BID   sodium chloride, acetaminophen, hydrALAZINE, nitroGLYCERIN, ondansetron **OR** ondansetron (ZOFRAN) IV, sodium chloride flush, traMADol  Assessment/ Plan:  68 y.o. African-American female with end stage renal disease on hemodialysis, hypertension, congestive heart failure, coronary artery disease status post CABG, diabetes mellitus type II insulin dependent, GERD, peripheral vascular disease status post right BKA, history of endocarditis and osteomyelitis  [facility] [place] TTS hemodialysis IJ permcath  *End-stage renal disease No acute indication for dialysis today.  If the patient is still here tomorrow we will plan for hemodialysis.  *Anemia of chronic kidney disease Continue Epogen 4000 units IV with dialysis.  *Secondary hyperparathyroidism Phosphorus 4.0 and at target.  Maintain the patient Renvela 800 mg p.o. 3 times daily.   LOS: 4 Garnell Begeman 1/15/202012:04 PM  [place], [place]  Note: This note was prepared with Dragon dictation. Any transcription errors are unintentional

## 2018-05-07 LAB — CULTURE, BLOOD (ROUTINE X 2)
Culture: NO GROWTH
Culture: NO GROWTH
Special Requests: ADEQUATE
Special Requests: ADEQUATE

## 2018-05-07 LAB — CBC
HCT: 39.7 % (ref 36.0–46.0)
Hemoglobin: 11.3 g/dL — ABNORMAL LOW (ref 12.0–15.0)
MCH: 28 pg (ref 26.0–34.0)
MCHC: 28.5 g/dL — ABNORMAL LOW (ref 30.0–36.0)
MCV: 98.5 fL (ref 80.0–100.0)
Platelets: 231 10*3/uL (ref 150–400)
RBC: 4.03 MIL/uL (ref 3.87–5.11)
RDW: 18.6 % — ABNORMAL HIGH (ref 11.5–15.5)
WBC: 8 10*3/uL (ref 4.0–10.5)
nRBC: 0 % (ref 0.0–0.2)

## 2018-05-07 LAB — GLUCOSE, CAPILLARY
Glucose-Capillary: 154 mg/dL — ABNORMAL HIGH (ref 70–99)
Glucose-Capillary: 105 mg/dL — ABNORMAL HIGH (ref 70–99)
Glucose-Capillary: 63 mg/dL — ABNORMAL LOW (ref 70–99)
Glucose-Capillary: 190 mg/dL — ABNORMAL HIGH (ref 70–99)

## 2018-05-07 LAB — RPR: RPR Ser Ql: NONREACTIVE

## 2018-05-07 MED ORDER — VANCOMYCIN HCL 500 MG IV SOLR
500.0000 mg | INTRAVENOUS | Status: DC
  Administered 2018-05-07: 500 mg via INTRAVENOUS
  Filled 2018-05-07: qty 500

## 2018-05-07 NOTE — Progress Notes (Signed)
[place] [facility] [place] NAME: Stephanie Ellis    MR#:  [id number]  DATE OF BIRTH:  16-Mar-1951  SUBJECTIVE:   Patient seen at hemodialysis earlier today.  Tolerating dialysis well.  Denies any fever chills chest pain shortness of breath or any other associated symptoms.  After discussion with her daughter today and also the patient they have now agreed to surgical debridement of her left stump infection.  Plan for surgery tomorrow as per vascular.  REVIEW OF SYSTEMS:    Review of Systems  Constitutional: Negative for chills and fever.  HENT: Negative for congestion and tinnitus.   Eyes: Negative for blurred vision and double vision.  Respiratory: Negative for cough, shortness of breath and wheezing.   Cardiovascular: Negative for chest pain, orthopnea and PND.  Gastrointestinal: Negative for abdominal pain, diarrhea, nausea and vomiting.  Genitourinary: Negative for dysuria and hematuria.  Neurological: Negative for dizziness, sensory change and focal weakness.  All other systems reviewed and are negative.   Nutrition: Rneal/Carb Tolerating Diet: Yes  DRUG ALLERGIES:   Allergies  Allergen Reactions  . Chlorthalidone Anaphylaxis, Itching and Rash  . Fentanyl Rash  . Midazolam Rash  . Ace Inhibitors Other (See Comments)    Reaction:  Hyperkalemia, agitation   . Angiotensin Receptor Blockers Other (See Comments)    Reaction:  Hyperkalemia, agitation   . Norvasc [Amlodipine] Itching and Rash  . Phenergan [Promethazine Hcl] Anxiety    "antsy, can't sit still"    VITALS:  Blood pressure (!) 194/102, pulse (!) 59, temperature 98.1 F (36.7 C), temperature source Oral, resp. rate 13, height 5' 4"  (1.626 m), weight 45.9 kg, SpO2 100 %.  PHYSICAL EXAMINATION:   Physical Exam  GENERAL:  68 y.o.-year-old patient lying getting HD in NAD.  EYES: Pupils equal, round, reactive to light and accommodation. No scleral icterus. Extraocular  muscles intact.  HEENT: Head atraumatic, normocephalic. Oropharynx and nasopharynx clear.  NECK:  Supple, no jugular venous distention. No thyroid enlargement, no tenderness.  LUNGS: Normal breath sounds bilaterally, no wheezing, rales, rhonchi. No use of accessory muscles of respiration.  CARDIOVASCULAR: S1, S2 normal. No murmurs, rubs, or gallops.  ABDOMEN: Soft, nontender, nondistended. Bowel sounds present. No organomegaly or mass.  EXTREMITIES: No cyanosis, clubbing or edema b/l.  B/l BKA with left stump dressing in place with foul smelling tan drainage.      NEUROLOGIC: Cranial nerves II through XII are intact. No focal Motor or sensory deficits b/l.   PSYCHIATRIC: The patient is alert and oriented x 3.  SKIN: No obvious rash, lesion, or ulcer.    LABORATORY PANEL:   CBC Recent Labs  Lab 05/07/18 0519  WBC 8.0  HGB 11.3*  HCT 39.7  PLT 231   ------------------------------------------------------------------------------------------------------------------  Chemistries  Recent Labs  Lab 05/02/18 1820  05/05/18 0415  NA 137   < > 140  K 2.7*   < > 3.7  CL 99   < > 102  CO2 28   < > 27  GLUCOSE 98   < > 165*  BUN 6*   < > 35*  CREATININE 2.05*   < > 6.76*  CALCIUM 9.1   < > 10.4*  AST 19  --   --   ALT 9  --   --   ALKPHOS 106  --   --   BILITOT 0.5  --   --    < > = values in this  interval not displayed.   ------------------------------------------------------------------------------------------------------------------  Cardiac Enzymes No results for input(s): TROPONINI in the last 168 hours. ------------------------------------------------------------------------------------------------------------------  RADIOLOGY:  No results found.   ASSESSMENT AND PLAN:   68 year old female with past medical history of end-stage renal disease on hemodialysis, peripheral vascular disease status post bilateral BKA, recent infection of the left stump, glaucoma,  hypertension, hyperlipidemia, diabetes, history of seizures who presented to the hospital due to foul-smelling drainage and suspected infection of the left BKA stump.  1.  Infection of the left BKA stump- MRI of the left leg showing some osteitis but less likely osteomyelitis. -Continue broad-spectrum IV antibiotics Zosyn. -After further discussion with the patient's daughter yesterday who then spoke to her mother they are in agreement with surgical debridement of the wound.  Plan for surgery tomorrow as discussed with vascular surgery. -Continue local wound care as per wound care nurse along with IV antibiotics for now. Wound cultures showing Pseudomonas and continue IV Zosyn for now.  2.  End-stage renal disease on hemodialysis-patient gets dialysis on Tuesday Thursday and Saturday. -Nephrology has been consulted.  Continue care as per them.    3.  Essential hypertension-continue carvedilol, Avapro, Imdur.  - BP stable.   4.  Restless leg syndrome-continue Requip.  5.  Secondary hyperparathyroidism-continue Renvela.  6.  Hx of seizures-no acute seizure type activity.  Continue Keppra.  7.  Chronic hypotension- continue midodrine with dialysis  8.  Hypokalemia- improved w/ HD.    9.  Glaucoma- continue timolol, alphagan eyedrops.   All the records are reviewed and case discussed with Care Management/Social Worker. Management plans discussed with the patient, family and they are in agreement.  CODE STATUS: Full code  DVT Prophylaxis: Eliquis  TOTAL TIME TAKING CARE OF THIS PATIENT: 25 minutes.   POSSIBLE D/C IN 2-3 DAYS, DEPENDING ON CLINICAL CONDITION.   Henreitta Leber M.D on 05/07/2018 at 3:46 PM  Between 7am to 6pm - Pager - [phone]  After 6pm go to [email or web address] - Proofreader  [facility]  Office  [phone]  CC: Primary care physician; Hanley Ben, NP

## 2018-05-07 NOTE — Progress Notes (Signed)
[facility] [place], [place] 05/07/18  Subjective:  Patient seen and evaluated during hemodialysis. Tolerating well.    Objective:  Vital signs in last 24 hours:  Temp:  [97.4 F (36.3 C)-98.2 F (36.8 C)] 98.1 F (36.7 C) (01/16 1017) Pulse Rate:  [53-62] 54 (01/16 1100) Resp:  [8-18] 9 (01/16 1100) BP: (110-188)/(51-73) 127/51 (01/16 1100) SpO2:  [92 %-98 %] 98 % (01/16 1100) Weight:  [46.9 kg-47 kg] 47 kg (01/16 1017)  Weight change: -2.353 kg Filed Weights   05/05/18 1737 05/07/18 0508 05/07/18 1017  Weight: 47.1 kg 46.9 kg 47 kg    Intake/Output:    Intake/Output Summary (Last 24 hours) at 05/07/2018 1250 Last data filed at 05/07/2018 0834 Gross per 24 hour  Intake 152.83 ml  Output 0 ml  Net 152.83 ml     Physical Exam: General:  No acute distress, laying in the bed  HEENT  moist oral mucous membranes  Pulm/lungs  room air, clear to auscultation  CVS/Heart  no rub or gallop  Abdomen:   Soft, nontender  Extremities:  Bilateral BKA, left stump dressed  Neurologic:  Alert, able to answer all questions appropriately  Skin:  No acute rashes  Access:  Left IJ PermCath       Basic Metabolic Panel:  Recent Labs  Lab 05/02/18 1820 05/03/18 0437 05/04/18 0240 05/05/18 0415 05/05/18 1406  NA 137 136  --  140  --   K 2.7* 2.8* 2.9* 3.7  --   CL 99 99  --  102  --   CO2 28 27  --  27  --   GLUCOSE 98 150*  --  165*  --   BUN 6* 10  --  35*  --   CREATININE 2.05* 2.93*  --  6.76*  --   CALCIUM 9.1 9.5  --  10.4*  --   PHOS  --   --   --   --  4.7*     CBC: Recent Labs  Lab 05/02/18 1820 05/03/18 0437 05/04/18 0240 05/07/18 0519  WBC 9.2 10.1 9.0 8.0  NEUTROABS 6.2  --   --   --   HGB 11.4* 11.4* 9.7* 11.3*  HCT 38.4 38.7 33.1* 39.7  MCV 97.0 96.0 96.2 98.5  PLT 300 307 260 231     No results found for: HEPBSAG, HEPBSAB, HEPBIGM    Microbiology:  Recent Results (from the past 240 hour(s))  Aerobic/Anaerobic  Culture (surgical/deep wound)     Status: None (Preliminary result)   Collection Time: 05/02/18  6:20 PM  Result Value Ref Range Status   Specimen Description   Final    WOUND Performed at [facility], [address], [place], [place] [address]    Special Requests   Final    Normal Performed at [facility], [address] [place], [place] [address]    Gram Stain   Final    FEW WBC PRESENT, PREDOMINANTLY PMN ABUNDANT GRAM VARIABLE ROD Performed at [facility], [place] [address], [place],  [address]    Culture   Final    MODERATE PSEUDOMONAS AERUGINOSA MODERATE VANCOMYCIN RESISTANT ENTEROCOCCUS NO ANAEROBES ISOLATED; CULTURE IN PROGRESS FOR 5 DAYS    Report Status PENDING  Incomplete   Organism ID, Bacteria PSEUDOMONAS AERUGINOSA  Final   Organism ID, Bacteria VANCOMYCIN RESISTANT ENTEROCOCCUS  Final      Susceptibility   Pseudomonas aeruginosa - MIC*    CEFTAZIDIME 16 INTERMEDIATE  Intermediate     CIPROFLOXACIN <=0.25 SENSITIVE Sensitive     GENTAMICIN <=1 SENSITIVE Sensitive     IMIPENEM 1 SENSITIVE Sensitive     CEFEPIME INTERMEDIATE Intermediate     * MODERATE PSEUDOMONAS AERUGINOSA   Vancomycin resistant enterococcus - MIC*    AMPICILLIN >=32 RESISTANT Resistant     VANCOMYCIN >=32 RESISTANT Resistant     GENTAMICIN SYNERGY SENSITIVE Sensitive     LINEZOLID 2 SENSITIVE Sensitive     * MODERATE VANCOMYCIN RESISTANT ENTEROCOCCUS  Blood culture (routine x 2)     Status: None   Collection Time: 05/02/18  6:20 PM  Result Value Ref Range Status   Specimen Description BLOOD LFA  Final   Special Requests   Final    BOTTLES DRAWN AEROBIC AND ANAEROBIC Blood Culture adequate volume   Culture   Final    NO GROWTH 5 DAYS Performed at [facility], [address] [place], [place] [address]    Report Status 05/07/2018 FINAL  Final  Blood culture (routine x 2)     Status: None   Collection Time: 05/02/18  6:24 PM  Result Value Ref  Range Status   Specimen Description BLOOD LWRIST  Final   Special Requests   Final    BOTTLES DRAWN AEROBIC AND ANAEROBIC Blood Culture adequate volume   Culture   Final    NO GROWTH 5 DAYS Performed at [facility], [address] [place], [place] [address]    Report Status 05/07/2018 FINAL  Final  MRSA PCR Screening     Status: None   Collection Time: 05/03/18  1:04 AM  Result Value Ref Range Status   MRSA by PCR NEGATIVE NEGATIVE Final    Comment:        The GeneXpert MRSA Assay (FDA approved for NASAL specimens only), is one component of a comprehensive MRSA colonization surveillance program. It is not intended to diagnose MRSA infection nor to guide or monitor treatment for MRSA infections. Performed at [facility], [address] [place], [place] [address]     Coagulation Studies: No results for input(s): LABPROT, INR in the last 72 hours.  Urinalysis: No results for input(s): COLORURINE, LABSPEC, PHURINE, GLUCOSEU, HGBUR, BILIRUBINUR, KETONESUR, PROTEINUR, UROBILINOGEN, NITRITE, LEUKOCYTESUR in the last 72 hours.  Invalid input(s): APPERANCEUR    Imaging: No results found.   Medications:   . sodium chloride    . piperacillin-tazobactam (ZOSYN)  IV 3.375 g (05/07/18 0841)  . vancomycin 500 mg (05/05/18 1629)   . apixaban  2.5 mg Oral BID  . aspirin EC  81 mg Oral Daily  . atorvastatin  80 mg Oral QHS  . brimonidine  1 drop Both Eyes TID  . calcitRIOL  0.5 mcg Oral Daily  . carvedilol  6.25 mg Oral BID WC  . docusate sodium  100 mg Oral BID  . epoetin (EPOGEN/PROCRIT) injection  4,000 Units Intravenous Q T,Th,Sa-HD  . famotidine  20 mg Oral Daily  . feeding supplement (NEPRO CARB STEADY)  237 mL Oral BID BM  . folic acid  1 mg Oral Daily  . insulin aspart  0-9 Units Subcutaneous TID WC  . irbesartan  75 mg Oral Daily  . isosorbide mononitrate  30 mg Oral Daily  . levETIRAcetam  250 mg Oral Once per day on Tue Thu Sat  .  levETIRAcetam  500 mg Oral BID  . midodrine  10 mg Oral Q T,Th,Sa-HD  . mirtazapine  7.5 mg Oral QHS  .  multivitamin  1 tablet Oral QHS  . multivitamin-lutein  1 capsule Oral Daily  . omega-3 acid ethyl esters  2 capsule Oral Daily  . polyvinyl alcohol  1 drop Both Eyes BID  . rOPINIRole  0.25 mg Oral QHS  . sevelamer carbonate  800 mg Oral TID WC  . sodium chloride flush  3 mL Intravenous Q12H  . timolol  1 drop Both Eyes TID  . vitamin B-12  1,000 mcg Oral Daily  . vitamin C  500 mg Oral BID   sodium chloride, acetaminophen, hydrALAZINE, nitroGLYCERIN, ondansetron **OR** ondansetron (ZOFRAN) IV, sodium chloride flush, traMADol  Assessment/ Plan:  68 y.o. African-American female with end stage renal disease on hemodialysis, hypertension, congestive heart failure, coronary artery disease status post CABG, diabetes mellitus type II insulin dependent, GERD, peripheral vascular disease status post right BKA, history of endocarditis and osteomyelitis  [facility] [place] TTS hemodialysis IJ permcath  *End-stage renal disease Patient seen and evaluated during hemodialysis. Tolerating well. We plan to complete doses today.  *Anemia of chronic kidney disease Hemoglobin currently 11.3.  Maintain the patient on Epogen.  *Secondary hyperparathyroidism Continue Renvela. Serum phosphorus 4.7.   LOS: 5 Stephanie Ellis 1/16/202012:50 PM  [place], [place]  Note: This note was prepared with Dragon dictation. Any transcription errors are unintentional

## 2018-05-07 NOTE — Progress Notes (Signed)
Post HD Tx    05/07/18 1421  Vital Signs  Temp 98.1 F (36.7 C)  Temp Source Oral  Pulse Rate (!) 59  Pulse Rate Source Monitor  Resp 13  BP (!) 194/102  BP Location Right Arm  BP Method Automatic  Patient Position (if appropriate) Lying  Oxygen Therapy  SpO2 100 %  O2 Device Room Air  Pulse Oximetry Type Continuous  Pain Assessment  Pain Scale 0-10  Pain Score 0  Dialysis Weight  Weight 45.9 kg  Type of Weight Post-Dialysis  Post-Hemodialysis Assessment  Rinseback Volume (mL) 250 mL  KECN 74 V  Dialyzer Clearance Lightly streaked  Duration of HD Treatment -hour(s) 3.5 hour(s)  Hemodialysis Intake (mL) 500 mL  UF Total -Machine (mL) 2098 mL  Net UF (mL) 1598 mL  Tolerated HD Treatment Yes  Hemodialysis Catheter Left Subclavian Double-lumen  Placement Date: 02/11/18   Placed prior to admission: No  Orientation: Left  Access Location: Subclavian  Hemodialysis Catheter Type: Double-lumen  Site Condition No complications  Blue Lumen Status Heparin locked  Red Lumen Status Heparin locked  Post treatment catheter status Capped and Clamped

## 2018-05-07 NOTE — Progress Notes (Signed)
HD Tx started w/o complication   22/97/98 9211  Vital Signs  Temp 98.1 F (36.7 C)  Temp Source Oral  Pulse Rate 60  Pulse Rate Source Monitor  Resp 12  BP 123/73  BP Location Right Arm  BP Method Automatic  Patient Position (if appropriate) Lying  Oxygen Therapy  SpO2 98 %  O2 Device Room Air  Pulse Oximetry Type Continuous  Pain Assessment  Pain Scale 0-10  Pain Score 0  Dialysis Weight  Weight 47 kg  Type of Weight Pre-Dialysis  Time-Out for Hemodialysis  What Procedure? HD  Pt Identifiers(min of two) First/Last Name;MRN/Account#  Correct Site? Yes  Correct Side? Yes  Correct Procedure? Yes  Consents Verified? Yes  Rad Studies Available? N/A  Safety Precautions Reviewed? Yes  Engineer, civil (consulting) Number 4  Station Number 2  UF/Alarm Test Passed  Conductivity: Meter 14  Conductivity: Machine  14  pH 7.4  Reverse Osmosis Main  Normal Saline Lot Number [id number]  Dialyzer Lot Number [id number]  Disposable Set Lot Number [id number]  Machine Temperature 98.6 F (37 C)  Musician and Audible Yes  Blood Lines Intact and Secured Yes  Pre Treatment Patient Checks  Vascular access used during treatment Catheter  Hepatitis B Surface Antigen Results Negative  Date Hepatitis B Surface Antigen Drawn 11/20/17  Hepatitis B Surface Antibody  (>10)  Date Hepatitis B Surface Antibody Drawn 11/26/17  Hemodialysis Consent Verified Yes  Hemodialysis Standing Orders Initiated Yes  ECG (Telemetry) Monitor On Yes  Prime Ordered Normal Saline  Length of  DialysisTreatment -hour(s) 3.5 Hour(s)  Dialysis Treatment Comments Na 140  Dialyzer Elisio 17H NR  Dialysis Anticoagulant None  Dialysate Flow Ordered 600  Blood Flow Rate Ordered 400 mL/min  Ultrafiltration Goal 800 Liters  Pre Treatment Labs Phosphorus  Dialysis Blood Pressure Support Ordered Normal Saline  During Hemodialysis Assessment  Blood Flow Rate (mL/min) 400 mL/min  Arterial Pressure (mmHg) -220 mmHg   Venous Pressure (mmHg) 120 mmHg  Transmembrane Pressure (mmHg) 40 mmHg  Ultrafiltration Rate (mL/min) 660 mL/min  Dialysate Flow Rate (mL/min) 800 ml/min  Conductivity: Machine  13.5  HD Safety Checks Performed Yes  Dialysis Fluid Bolus Normal Saline  Bolus Amount (mL) 250 mL  Intra-Hemodialysis Comments Tx initiated  Education / Care Plan  Dialysis Education Provided Yes  Documented Education in Care Plan Yes  Hemodialysis Catheter Left Subclavian Double-lumen  Placement Date: 02/11/18   Placed prior to admission: No  Orientation: Left  Access Location: Subclavian  Hemodialysis Catheter Type: Double-lumen  Site Condition No complications  Dressing Type Biopatch  Dressing Status Clean;Dry;Intact  Dressing Change Due 05/11/18

## 2018-05-07 NOTE — Progress Notes (Signed)
Pre HD Assessment    05/07/18 1000  Neurological  Level of Consciousness Alert  Orientation Level Oriented to person;Oriented to situation;Disoriented to place;Disoriented to time  Respiratory  Respiratory Pattern Regular;Unlabored  Chest Assessment Chest expansion symmetrical  Bilateral Breath Sounds Clear;Diminished  Cough None  Cardiac  Pulse Irregular  Heart Sounds S1, S2  ECG Monitor Yes  Vascular  R Radial Pulse +2  L Radial Pulse +2  Edema Periorbital;Generalized  Integumentary  Integumentary (WDL) X  Skin Color Appropriate for ethnicity  Skin Condition Dry  Skin Integrity Surgical Incision (see LDA);Amputation  Musculoskeletal  Musculoskeletal (WDL) X  Gastrointestinal  Bowel Sounds Assessment Active  GU Assessment  Genitourinary (WDL) X  Genitourinary Symptoms Anuria  Psychosocial  Psychosocial (WDL) WDL  Wound/Incision (LDAs)  Type of Wound/Incision (LDA) Incision  Incision (Closed) 05/03/18 Other (Comment) Right;Lower  Date First Assessed/Time First Assessed: 05/03/18 2333   Location: (c) Other (Comment)  Location Orientation: (c) Right;Lower  Present on Admission: Yes  Dressing Type Other (Comment) (open to air)  Site / Wound Assessment Clean;Dry  Drainage Amount None  Wound / Incision (Open or Dehisced) 05/02/18 Incision - Open Leg Left  Date First Assessed/Time First Assessed: 05/02/18 2130   Wound Type: (c) Incision - Open  Location: Leg  Location Orientation: Left  Present on Admission: Yes  Dressing Type Moist to dry  Dressing Status Clean;Dry;Intact  Site / Wound Assessment Dressing in place / Unable to assess  Peri-wound Assessment Intact  Drainage Amount None

## 2018-05-07 NOTE — Progress Notes (Signed)
PT Cancellation Note  Patient Details Name: Stephanie Ellis MRN: [id number] DOB: 08-Jul-1950   Cancelled Treatment:    Reason Eval/Treat Not Completed: Patient at procedure or test/unavailable - patient has been in dialysis. Will re-attempt at later date.   Hartwell Vandiver 05/07/2018, 3:35 PM

## 2018-05-07 NOTE — Progress Notes (Signed)
HD Tx completed, tolerated well   05/07/18 1415  Vital Signs  Pulse Rate (!) 56  Pulse Rate Source Monitor  Resp 13  BP (!) 188/85  BP Location Right Arm  BP Method Automatic  Patient Position (if appropriate) Lying  Oxygen Therapy  SpO2 100 %  O2 Device Room Air  Pulse Oximetry Type Continuous  During Hemodialysis Assessment  HD Safety Checks Performed Yes  KECN 74 KECN  Dialysis Fluid Bolus Normal Saline  Bolus Amount (mL) 250 mL  Intra-Hemodialysis Comments Tx completed;Tolerated well

## 2018-05-07 NOTE — Progress Notes (Signed)
Dressing changed on left stump

## 2018-05-07 NOTE — Progress Notes (Signed)
Post HD Assessment    05/07/18 1430  Neurological  Level of Consciousness Alert  Orientation Level Oriented to person;Oriented to situation;Disoriented to place;Disoriented to time  Respiratory  Respiratory Pattern Regular;Unlabored  Chest Assessment Chest expansion symmetrical  Bilateral Breath Sounds Clear  Cough None  Cardiac  Pulse Irregular  Heart Sounds S1, S2  ECG Monitor Yes  Vascular  R Radial Pulse +2  L Radial Pulse +2  Edema Generalized  Integumentary  Integumentary (WDL) X  Skin Color Appropriate for ethnicity  Skin Condition Dry  Skin Integrity Surgical Incision (see LDA);Amputation  Musculoskeletal  Musculoskeletal (WDL) X  Gastrointestinal  Bowel Sounds Assessment Active  GU Assessment  Genitourinary (WDL) X  Genitourinary Symptoms Anuria  Psychosocial  Psychosocial (WDL) WDL  Wound/Incision (LDAs)  Type of Wound/Incision (LDA) Incision  Incision (Closed) 05/03/18 Other (Comment) Right;Lower  Date First Assessed/Time First Assessed: 05/03/18 2333   Location: (c) Other (Comment)  Location Orientation: (c) Right;Lower  Present on Admission: Yes  Dressing Type Other (Comment) (open to air)  Site / Wound Assessment Clean;Dry  Drainage Amount None  Wound / Incision (Open or Dehisced) 05/02/18 Incision - Open Leg Left  Date First Assessed/Time First Assessed: 05/02/18 2130   Wound Type: (c) Incision - Open  Location: Leg  Location Orientation: Left  Present on Admission: Yes  Dressing Type Moist to dry  Dressing Status Clean;Dry;Intact  Site / Wound Assessment Dressing in place / Unable to assess  Peri-wound Assessment Intact  Drainage Amount None

## 2018-05-08 ENCOUNTER — Inpatient Hospital Stay: Payer: Medicare Other | Admitting: Certified Registered"

## 2018-05-08 ENCOUNTER — Encounter: Payer: Self-pay | Admitting: Anesthesiology

## 2018-05-08 ENCOUNTER — Ambulatory Visit (INDEPENDENT_AMBULATORY_CARE_PROVIDER_SITE_OTHER): Payer: Medicare Other | Admitting: Vascular Surgery

## 2018-05-08 ENCOUNTER — Encounter
Admission: EM | Disposition: A | Discharge status: Skilled Nursing Facility | Payer: Self-pay | Source: Home / Self Care | Attending: Specialist

## 2018-05-08 DIAGNOSIS — T8754 Necrosis of amputation stump, left lower extremity: Secondary | ICD-10-CM

## 2018-05-08 DIAGNOSIS — T8744 Infection of amputation stump, left lower extremity: Secondary | ICD-10-CM

## 2018-05-08 HISTORY — PX: WOUND DEBRIDEMENT: SHX247

## 2018-05-08 LAB — BASIC METABOLIC PANEL
Anion gap: 9 (ref 5–15)
BUN: 16 mg/dL (ref 8–23)
CO2: 32 mmol/L (ref 22–32)
Calcium: 9.8 mg/dL (ref 8.9–10.3)
Chloride: 98 mmol/L (ref 98–111)
Creatinine, Ser: 4.16 mg/dL — ABNORMAL HIGH (ref 0.44–1.00)
GFR calc Af Amer: 12 mL/min — ABNORMAL LOW (ref >60)
GFR calc non Af Amer: 10 mL/min — ABNORMAL LOW (ref >60)
Glucose, Bld: 153 mg/dL — ABNORMAL HIGH (ref 70–99)
Potassium: 3.4 mmol/L — ABNORMAL LOW (ref 3.5–5.1)
Sodium: 139 mmol/L (ref 135–145)

## 2018-05-08 LAB — MAGNESIUM: Magnesium: 1.9 mg/dL (ref 1.7–2.4)

## 2018-05-08 LAB — CBC
HCT: 41 % (ref 36.0–46.0)
Hemoglobin: 11.9 g/dL — ABNORMAL LOW (ref 12.0–15.0)
MCH: 27.5 pg (ref 26.0–34.0)
MCHC: 29 g/dL — ABNORMAL LOW (ref 30.0–36.0)
MCV: 94.7 fL (ref 80.0–100.0)
Platelets: 273 10*3/uL (ref 150–400)
RBC: 4.33 MIL/uL (ref 3.87–5.11)
RDW: 18.3 % — ABNORMAL HIGH (ref 11.5–15.5)
WBC: 8.4 10*3/uL (ref 4.0–10.5)
nRBC: 0 % (ref 0.0–0.2)

## 2018-05-08 LAB — QUANTIFERON-TB GOLD PLUS (RQFGPL)
QuantiFERON Mitogen Value: 2.03 IU/mL
QuantiFERON Nil Value: 0.03 IU/mL
QuantiFERON TB1 Ag Value: 0.03 IU/mL
QuantiFERON TB2 Ag Value: 0.03 IU/mL

## 2018-05-08 LAB — TYPE AND SCREEN
ABO/RH(D): B POS
Antibody Screen: NEGATIVE

## 2018-05-08 LAB — GLUCOSE, CAPILLARY
Glucose-Capillary: 165 mg/dL — ABNORMAL HIGH (ref 70–99)
Glucose-Capillary: 87 mg/dL (ref 70–99)
Glucose-Capillary: 121 mg/dL — ABNORMAL HIGH (ref 70–99)
Glucose-Capillary: 78 mg/dL (ref 70–99)
Glucose-Capillary: 110 mg/dL — ABNORMAL HIGH (ref 70–99)
Glucose-Capillary: 107 mg/dL — ABNORMAL HIGH (ref 70–99)
Glucose-Capillary: 183 mg/dL — ABNORMAL HIGH (ref 70–99)

## 2018-05-08 LAB — APTT: aPTT: 32 seconds (ref 24–36)

## 2018-05-08 LAB — QUANTIFERON-TB GOLD PLUS: QuantiFERON-TB Gold Plus: NEGATIVE

## 2018-05-08 LAB — PROTIME-INR
INR: 1.11
Prothrombin Time: 14.2 seconds (ref 11.4–15.2)

## 2018-05-08 SURGERY — DEBRIDEMENT, WOUND
Anesthesia: General | Laterality: Left

## 2018-05-08 MED ORDER — PROPOFOL 10 MG/ML IV BOLUS
INTRAVENOUS | Status: AC
  Filled 2018-05-08: qty 20

## 2018-05-08 MED ORDER — FENTANYL CITRATE (PF) 100 MCG/2ML IJ SOLN: INTRAMUSCULAR | Status: DC | PRN

## 2018-05-08 MED ORDER — MIDAZOLAM HCL 2 MG/2ML IJ SOLN
INTRAMUSCULAR | Status: AC
  Filled 2018-05-08: qty 2

## 2018-05-08 MED ORDER — PIPERACILLIN-TAZOBACTAM 3.375 G IVPB
INTRAVENOUS | Status: AC
  Filled 2018-05-08: qty 50

## 2018-05-08 MED ORDER — FENTANYL CITRATE (PF) 100 MCG/2ML IJ SOLN: 25.0000 ug | INTRAMUSCULAR | Status: DC | PRN

## 2018-05-08 MED ORDER — PROPOFOL 500 MG/50ML IV EMUL
INTRAVENOUS | Status: DC | PRN
  Administered 2018-05-08: 80 ug/kg/min via INTRAVENOUS

## 2018-05-08 MED ORDER — FENTANYL CITRATE (PF) 100 MCG/2ML IJ SOLN
INTRAMUSCULAR | Status: AC
  Filled 2018-05-08: qty 2

## 2018-05-08 MED ORDER — LEVETIRACETAM IN NACL 500 MG/100ML IV SOLN
500.0000 mg | Freq: Two times a day (BID) | INTRAVENOUS | Status: DC
  Administered 2018-05-09 – 2018-05-10 (×3): 500 mg via INTRAVENOUS
  Filled 2018-05-08 (×6): qty 100

## 2018-05-08 MED ORDER — DEXTROSE 50 % IV SOLN
INTRAVENOUS | Status: AC
  Filled 2018-05-08: qty 50

## 2018-05-08 MED ORDER — BUPIVACAINE HCL (PF) 0.5 % IJ SOLN
INTRAMUSCULAR | Status: AC
  Filled 2018-05-08: qty 30

## 2018-05-08 MED ORDER — MIDAZOLAM HCL 2 MG/2ML IJ SOLN
INTRAMUSCULAR | Status: DC | PRN
  Administered 2018-05-08: 1 mg via INTRAVENOUS

## 2018-05-08 MED ORDER — LIDOCAINE HCL (CARDIAC) PF 100 MG/5ML IV SOSY
PREFILLED_SYRINGE | INTRAVENOUS | Status: DC | PRN
  Administered 2018-05-08: 40 mg via INTRATRACHEAL

## 2018-05-08 MED ORDER — DEXTROSE 50 % IV SOLN
25.0000 mL | Freq: Once | INTRAVENOUS | Status: AC
  Administered 2018-05-08: 25 mL via INTRAVENOUS

## 2018-05-08 MED ORDER — ONDANSETRON HCL 4 MG/2ML IJ SOLN: 4.0000 mg | Freq: Once | INTRAMUSCULAR | Status: DC | PRN

## 2018-05-08 MED ORDER — PROPOFOL 500 MG/50ML IV EMUL
INTRAVENOUS | Status: AC
  Filled 2018-05-08: qty 50

## 2018-05-08 MED ORDER — LIDOCAINE HCL (PF) 1 % IJ SOLN
INTRAMUSCULAR | Status: AC
  Filled 2018-05-08: qty 30

## 2018-05-08 SURGICAL SUPPLY — 37 items
BNDG GAUZE 4.5X4.1 6PLY STRL (MISCELLANEOUS) ×1 IMPLANT
BRUSH SCRUB EZ  4% CHG (MISCELLANEOUS)
BRUSH SCRUB EZ 4% CHG (MISCELLANEOUS) ×1 IMPLANT
CANISTER SUCT 1200ML W/VALVE (MISCELLANEOUS) ×2 IMPLANT
CHLORAPREP W/TINT 26ML (MISCELLANEOUS) ×2 IMPLANT
COVER WAND RF STERILE (DRAPES) ×1 IMPLANT
DRAPE INCISE IOBAN 66X45 STRL (DRAPES) ×1 IMPLANT
DRSG VAC ATS MED SENSATRAC (GAUZE/BANDAGES/DRESSINGS) ×1 IMPLANT
ELECT CAUTERY BLADE 6.4 (BLADE) ×1 IMPLANT
ELECT REM PT RETURN 9FT ADLT (ELECTROSURGICAL) ×2
ELECTRODE REM PT RTRN 9FT ADLT (ELECTROSURGICAL) ×1 IMPLANT
GLOVE BIO SURGEON STRL SZ7 (GLOVE) ×2 IMPLANT
GOWN STRL REUS W/ TWL LRG LVL3 (GOWN DISPOSABLE) ×1 IMPLANT
GOWN STRL REUS W/ TWL XL LVL3 (GOWN DISPOSABLE) ×1 IMPLANT
GOWN STRL REUS W/TWL LRG LVL3 (GOWN DISPOSABLE) ×1
GOWN STRL REUS W/TWL XL LVL3 (GOWN DISPOSABLE) ×1
IV NS 1000ML (IV SOLUTION) ×1
IV NS 1000ML BAXH (IV SOLUTION) ×1 IMPLANT
KIT TURNOVER KIT A (KITS) ×2 IMPLANT
LABEL OR SOLS (LABEL) ×2 IMPLANT
NS IRRIG 500ML POUR BTL (IV SOLUTION) ×2 IMPLANT
PACK EXTREMITY ARMC (MISCELLANEOUS) ×2 IMPLANT
PAD ABD DERMACEA PRESS 5X9 (GAUZE/BANDAGES/DRESSINGS) ×1 IMPLANT
PAD PREP 24X41 OB/GYN DISP (PERSONAL CARE ITEMS) ×2 IMPLANT
PULSAVAC PLUS IRRIG FAN TIP (DISPOSABLE) ×2
SOL PREP PVP 2OZ (MISCELLANEOUS) ×2
SOLUTION PREP PVP 2OZ (MISCELLANEOUS) ×1 IMPLANT
SPONGE LAP 18X18 RF (DISPOSABLE) ×2 IMPLANT
SUT ETHILON 4-0 (SUTURE)
SUT ETHILON 4-0 FS2 18XMFL BLK (SUTURE)
SUT VIC AB 3-0 SH 27 (SUTURE)
SUT VIC AB 3-0 SH 27X BRD (SUTURE) ×2 IMPLANT
SUTURE ETHLN 4-0 FS2 18XMF BLK (SUTURE) ×1 IMPLANT
SWAB CULTURE AMIES ANAERIB BLU (MISCELLANEOUS) ×2 IMPLANT
SYR BULB 3OZ (MISCELLANEOUS) ×1 IMPLANT
TIP FAN IRRIG PULSAVAC PLUS (DISPOSABLE) ×1 IMPLANT
WND VAC CANISTER 500ML (MISCELLANEOUS) ×1 IMPLANT

## 2018-05-08 NOTE — Progress Notes (Signed)
PT Cancellation Note  Patient Details Name: Stephanie Ellis MRN: [id number] DOB: 16-May-1950   Cancelled Treatment:    Reason Eval/Treat Not Completed: Patient at procedure or test/unavailable, will attempt to see pt at a future date/time as medically appropriate.    Linus Salmons PT, DPT 05/08/18, 1:08 PM

## 2018-05-08 NOTE — Op Note (Signed)
    OPERATIVE NOTE   PROCEDURE: 1. Irrigation and debridement of left BKA stump of skin, soft tissue, and muscle for approximately 100 cm2  PRE-OPERATIVE DIAGNOSIS: Nonviable tissue and infection of left BKA stump  POST-OPERATIVE DIAGNOSIS: Same as above  SURGEON: Leotis Pain, MD  ASSISTANT(S): Hezzie Bump, PA-C  ANESTHESIA: MAC  ESTIMATED BLOOD LOSS: 10 cc  FINDING(S): None  SPECIMEN(S):  Wound culture sent  INDICATIONS:   Stephanie Ellis is a 68 y.o. female who presents with an open wound of her left BKA stump with multiple areas of necrotic eschar.  Debridement is being performed to remove dead tissue and try to promote wound healing.  The patient has a widely dehisced wound with a relatively low likelihood of healing and it has been recommended to her to have an above-knee amputation revision but she has refused.  It is also been recommended that she have a negative pressure dressing to help hold the wound to keep her from opening further but she has refused that as well. An assistant was present during the procedure to help facilitate the exposure and expedite the procedure.  DESCRIPTION: After obtaining full informed written consent, the patient was brought back to the operating room and placed supine upon the operating table.  The patient received IV antibiotics prior to induction.  After obtaining adequate anesthesia, the patient was prepped and draped in the standard fashion. The assistant provided retraction and mobilization to help facilitate exposure and expedite the procedure throughout the entire procedure.  This included following suture, using retractors, and optimizing lighting.  The wound was then irrigated and excisional debridement was performed to mostly the muscle bed and fascia to remove the necrotic tissue as well as some skin edge particularly laterally to remove all clearly non-viable tissue.  The tissue was taken back to bleeding tissue that appeared viable.  The  debridement was performed with Metzenbaum or Mayo scissors and encompassed an area of approximately 100 cm2.  The total size of the wound was in the range 130 to 140 cm, and the necrotic eschar covered the majority of the wound.  The wound was irrigated copiously with pulse lavage saline.  After all clearly non-viable tissue was removed, a wet-to-dry dressing was placed as the patient had refused a negative pressure dressing. The patient was then awakened from anesthesia and taken to the recovery room in stable condition having tolerated the procedure well.  COMPLICATIONS: none  CONDITION: stable  Leotis Pain  05/08/2018, 2:09 PM   This note was created with Dragon Medical transcription system. Any errors in dictation are purely unintentional.

## 2018-05-08 NOTE — Progress Notes (Signed)
Pnt is refusing 2200 PO medications tonight. Pnt is intermittently confused this evening. At shift change pnt's family reported that she has been intermittently confused since she came back from her I&D.   Pnt was woken up to give her nighttime meds however she said she didn't feel that she needed. Educated pnt on importance of taking her meds as prescribed but she continued to refuse.  IV Antibiotics were able to be given. Notified Md to modify Keppra to IV if possible. Will administer IV when ordered. Marland Kitchen

## 2018-05-08 NOTE — Progress Notes (Signed)
Patient resting comfortably after surgery. Dressing intact. Wound cultures were sent at surgery and will be pending.  These can be followed up by ID and discharge for consideration of antibiotics Wet-to-dry dressing should be performed daily and more than that as needed if they are saturated.  The patient refused a wound VAC which would have been a typical option for these kinds of wounds.  This should be done daily at her facility.  She can follow-up with [place] in the office in about a week and a half for a wound check.

## 2018-05-08 NOTE — Progress Notes (Signed)
[facility] NAME: Stephanie Ellis    MR#:  [id number]  DATE OF BIRTH:  [birth year]  SUBJECTIVE:   Patient seen earlier and plan was to go for debridement and cleanout of her left stump infection.  No other acute events overnight, patient is afebrile and hemodynamically stable.  REVIEW OF SYSTEMS:    Review of Systems  Constitutional: Negative for chills and fever.  HENT: Negative for congestion and tinnitus.   Eyes: Negative for blurred vision and double vision.  Respiratory: Negative for cough, shortness of breath and wheezing.   Cardiovascular: Negative for chest pain, orthopnea and PND.  Gastrointestinal: Negative for abdominal pain, diarrhea, nausea and vomiting.  Genitourinary: Negative for dysuria and hematuria.  Neurological: Negative for dizziness, sensory change and focal weakness.  All other systems reviewed and are negative.   Nutrition: Rneal/Carb Tolerating Diet: Yes  DRUG ALLERGIES:   Allergies  Allergen Reactions  . Chlorthalidone Anaphylaxis, Itching and Rash  . Fentanyl Rash  . Midazolam Rash  . Ace Inhibitors Other (See Comments)    Reaction:  Hyperkalemia, agitation   . Angiotensin Receptor Blockers Other (See Comments)    Reaction:  Hyperkalemia, agitation   . Norvasc [Amlodipine] Itching and Rash  . Phenergan [Promethazine Hcl] Anxiety    "antsy, can't sit still"    VITALS:  Blood pressure (!) 185/52, pulse 63, temperature 98.2 F (36.8 C), temperature source Oral, resp. rate 18, height 5' 4"  (1.626 m), weight 45.9 kg, SpO2 99 %.  PHYSICAL EXAMINATION:   Physical Exam  GENERAL:  68 y.o.-year-old patient lying getting HD in NAD.  EYES: Pupils equal, round, reactive to light and accommodation. No scleral icterus. Extraocular muscles intact.  HEENT: Head atraumatic, normocephalic. Oropharynx and nasopharynx clear.  NECK:  Supple, no jugular venous distention. No thyroid enlargement, no  tenderness.  LUNGS: Normal breath sounds bilaterally, no wheezing, rales, rhonchi. No use of accessory muscles of respiration.  CARDIOVASCULAR: S1, S2 normal. No murmurs, rubs, or gallops.  ABDOMEN: Soft, nontender, nondistended. Bowel sounds present. No organomegaly or mass.  EXTREMITIES: No cyanosis, clubbing or edema b/l.  B/l BKA with left stump dressing in place with foul smelling tan drainage.      NEUROLOGIC: Cranial nerves II through XII are intact. No focal Motor or sensory deficits b/l.   PSYCHIATRIC: The patient is alert and oriented x 3.  SKIN: No obvious rash, lesion, or ulcer.    LABORATORY PANEL:   CBC Recent Labs  Lab 05/08/18 0410  WBC 8.4  HGB 11.9*  HCT 41.0  PLT 273   ------------------------------------------------------------------------------------------------------------------  Chemistries  Recent Labs  Lab 05/02/18 1820  05/08/18 0410  NA 137   < > 139  K 2.7*   < > 3.4*  CL 99   < > 98  CO2 28   < > 32  GLUCOSE 98   < > 153*  BUN 6*   < > 16  CREATININE 2.05*   < > 4.16*  CALCIUM 9.1   < > 9.8  MG  --   --  1.9  AST 19  --   --   ALT 9  --   --   ALKPHOS 106  --   --   BILITOT 0.5  --   --    < > = values in this interval not displayed.   ------------------------------------------------------------------------------------------------------------------  Cardiac Enzymes No results for input(s): TROPONINI in the last 168 hours. ------------------------------------------------------------------------------------------------------------------  RADIOLOGY:  No results found.   ASSESSMENT AND PLAN:   68 year old female with past medical history of end-stage renal disease on hemodialysis, peripheral vascular disease status post bilateral BKA, recent infection of the left stump, glaucoma, hypertension, hyperlipidemia, diabetes, history of seizures who presented to the hospital due to foul-smelling drainage and suspected infection of the left  BKA stump.  1.  Infection of the left BKA stump- MRI of the left leg showing some osteitis but less likely osteomyelitis. -Continue broad-spectrum IV antibiotics Zosyn. -Status post irrigation and debridement of the left BKA stump today per vascular surgery.  Patient refuses a wound VAC.  Continue wet-to-dry dressings daily and antibiotics with IV Zosyn. -Wound cultures were sent intraoperatively and will follow them up. -Follow-up with vascular surgery as an outpatient after discharge.  2.  End-stage renal disease on hemodialysis-patient gets dialysis on Tuesday Thursday and Saturday. - nephrology following and cont. Dialysis as per them.   3.  Essential hypertension-continue carvedilol, Avapro, Imdur.  - BP stable.   4.  Restless leg syndrome-continue Requip.  5.  Secondary hyperparathyroidism-continue Renvela.  6.  Hx of seizures-no acute seizure type activity.  Continue Keppra.  7.  Chronic hypotension- continue midodrine with dialysis  8.  Hypokalemia- improved w/ HD.    9.  Glaucoma- continue timolol, alphagan eyedrops.   All the records are reviewed and case discussed with Care Management/Social Worker. Management plans discussed with the patient, family and they are in agreement.  CODE STATUS: Full code  DVT Prophylaxis: Eliquis  TOTAL TIME TAKING CARE OF THIS PATIENT: 30 minutes.   POSSIBLE D/C IN 2-3 DAYS, DEPENDING ON CLINICAL CONDITION.   Henreitta Leber M.D on 05/08/2018 at 4:27 PM  Between 7am to 6pm - Pager - [phone]  After 6pm go to [email or web address] - Proofreader  [facility]  Office  [phone]  CC: Primary care physician; Hanley Ben, NP

## 2018-05-08 NOTE — Anesthesia Preprocedure Evaluation (Signed)
Anesthesia Evaluation  Patient identified by MRN, date of birth, ID band Patient awake    Reviewed: Allergy & Precautions, NPO status , Patient's Chart, lab work & pertinent test results, reviewed documented beta blocker date and time   History of Anesthesia Complications (+) PROLONGED EMERGENCE and history of anesthetic complications  Airway Mallampati: II  TM Distance: >3 FB     Dental  (+) Chipped, Dental Advidsory Given   Pulmonary neg pulmonary ROS,           Cardiovascular hypertension, Pt. on medications and Pt. on home beta blockers + angina + CAD, + Past MI, + Cardiac Stents, + Peripheral Vascular Disease and +CHF  + dysrhythmias + Valvular Problems/Murmurs      Neuro/Psych  Neuromuscular disease    GI/Hepatic GERD  Controlled,  Endo/Other  diabetes, Type 2  Renal/GU ESRF and DialysisRenal disease     Musculoskeletal   Abdominal   Peds negative pediatric ROS (+)  Hematology  (+) Blood dyscrasia, anemia ,   Anesthesia Other Findings Past Medical History: No date: (HFpEF) heart failure with preserved ejection fraction ([place])     Comment:  a. 01/2018 Echo: EF 50-55%, no rwma, Gr1 DD, triv AI,               mild MR. Midly dil LA/RV, mod reduced RV fxn. Irreg               thickening of TV w/ mobile echodensity that appears to               arise from valve. No date: Anemia No date: Anorexia No date: Bacteremia due to Pseudomonas No date: Carotid arterial disease ([place])     Comment:  a. 01/2017 Carotid U/S: RICA <54, LICA <56. No date: Complication of anesthesia     Comment:  a. Pt reports h/o complication on 9 different occasions               - ? hypotension/arrest. 01/2015: Coronary artery disease involving left main coronary artery     Comment:  a. 01/2015 Cath [facility]): 70% LM, p-mLAD 50-60% (Resting               FFR 0.75), mRCA 80-90%, ~40 Ost OM & D1-->CABG; b.               01/2016 Staged PCI of LCX  x 2 and RCA. No date: ESRD (end stage renal disease) on dialysis [facility])     Comment:  a. ESRD secondary to acute kidney failure s/p CABG-->PD No date: Essential hypertension No date: GERD (gastroesophageal reflux disease) No date: Heart murmur No date: Hypercholesterolemia 2016: Myocardial infarction ([place]) No date: PVD (peripheral vascular disease) ([place])     Comment:  a. 01/30/2018 PV Angio: Sev Left Popliteal dzs s/p PTA               w/ 70m lutonix DEB w/ mech aspiration of the L popliteal,              L AT, and Left tibioperoneal trunck and peroneal artery. 03/24/2015: S/P CABG x 3     Comment:  a.  [facility]: Dr. BMarland KitchenHaithcock: CABG x 3, LIMA to LAD,              SVG to RCA, [facility] OM3, [facility]: Sinusitis No date: Type II diabetes mellitus with complication ([place]     Comment:  CAD  Reproductive/Obstetrics negative OB ROS  Anesthesia Physical  Anesthesia Plan  ASA: IV  Anesthesia Plan: General   Post-op Pain Management:    Induction: Intravenous  PONV Risk Score and Plan: 3 and Treatment may vary due to age or medical condition, Propofol infusion and TIVA  Airway Management Planned: Simple Face Mask and Natural Airway  Additional Equipment:   Intra-op Plan:   Post-operative Plan:   Informed Consent: I have reviewed the patients History and Physical, chart, labs and discussed the procedure including the risks, benefits and alternatives for the proposed anesthesia with the patient or authorized representative who has indicated his/her understanding and acceptance.     Dental Advisory Given  Plan Discussed with: CRNA  Anesthesia Plan Comments:         Anesthesia Quick Evaluation

## 2018-05-08 NOTE — Progress Notes (Signed)
ID Pt had debridement of the infected left BKA stump and deep cultures were sent from OR- await micro to aid antibiotics Continue zosyn - DC vanco ( as VRE in the superficial wound- if it is present in the deep cultures then will treat))

## 2018-05-08 NOTE — Progress Notes (Signed)
OT Cancellation Note  Patient Details Name: Stephanie Ellis MRN: [id number] DOB: 12-Sep-1950   Cancelled Treatment:    Reason Eval/Treat Not Completed: Other (comment). Pt s/p I&D of L stump today. Will attempt OT tx next date as appropriate.   Jeni Salles, MPH, MS, OTR/L ascom [phone] 05/08/18, 4:33 PM

## 2018-05-08 NOTE — Progress Notes (Signed)
[facility], [place] 05/08/18  Subjective:  Patient for debridement of left BKA stump. Completed dialysis yesterday. Resting comfortably in bed at the moment.    Objective:  Vital signs in last 24 hours:  Temp:  [98 F (36.7 C)-98.2 F (36.8 C)] 98.1 F (36.7 C) (01/17 0819) Pulse Rate:  [45-123] 123 (01/17 0819) Resp:  [6-18] 18 (01/17 0819) BP: (114-194)/(48-120) 163/48 (01/17 0819) SpO2:  [98 %-100 %] 98 % (01/17 0819) Weight:  [45.9 kg] 45.9 kg (01/16 1421)  Weight change: 0.053 kg Filed Weights   05/07/18 0508 05/07/18 1017 05/07/18 1421  Weight: 46.9 kg 47 kg 45.9 kg    Intake/Output:    Intake/Output Summary (Last 24 hours) at 05/08/2018 1046 Last data filed at 05/07/2018 1421 Gross per 24 hour  Intake -  Output 1598 ml  Net -1598 ml     Physical Exam: General:  No acute distress, laying in the bed  HEENT  moist oral mucous membranes  Pulm/lungs  room air, clear to auscultation  CVS/Heart  no rub or gallop  Abdomen:   Soft, nontender  Extremities:  Bilateral BKA, left stump dressed  Neurologic:  Alert, able to answer all questions appropriately  Skin:  No acute rashes  Access:  Left IJ PermCath       Basic Metabolic Panel:  Recent Labs  Lab 05/02/18 1820 05/03/18 0437 05/04/18 0240 05/05/18 0415 05/05/18 1406 05/08/18 0410  NA 137 136  --  140  --  139  K 2.7* 2.8* 2.9* 3.7  --  3.4*  CL 99 99  --  102  --  98  CO2 28 27  --  27  --  32  GLUCOSE 98 150*  --  165*  --  153*  BUN 6* 10  --  35*  --  16  CREATININE 2.05* 2.93*  --  6.76*  --  4.16*  CALCIUM 9.1 9.5  --  10.4*  --  9.8  MG  --   --   --   --   --  1.9  PHOS  --   --   --   --  4.7*  --      CBC: Recent Labs  Lab 05/02/18 1820 05/03/18 0437 05/04/18 0240 05/07/18 0519 05/08/18 0410  WBC 9.2 10.1 9.0 8.0 8.4  NEUTROABS 6.2  --   --   --   --   HGB 11.4* 11.4* 9.7* 11.3* 11.9*  HCT 38.4 38.7 33.1* 39.7 41.0  MCV 97.0 96.0 96.2 98.5 94.7   PLT 300 307 260 231 273     No results found for: HEPBSAG, HEPBSAB, HEPBIGM    Microbiology:  Recent Results (from the past 240 hour(s))  Aerobic/Anaerobic Culture (surgical/deep wound)     Status: None (Preliminary result)   Collection Time: 05/02/18  6:20 PM  Result Value Ref Range Status   Specimen Description   Final    WOUND Performed at [facility], [address], [place], [place] [address]    Special Requests   Final    Normal Performed at [facility], [address] [place], [place] [address]    Gram Stain   Final    FEW WBC PRESENT, PREDOMINANTLY PMN ABUNDANT GRAM VARIABLE ROD Performed at [facility], [place] [address] [place], [place] [address]    Culture   Final    MODERATE PSEUDOMONAS AERUGINOSA MODERATE VANCOMYCIN RESISTANT ENTEROCOCCUS NO ANAEROBES ISOLATED; CULTURE IN PROGRESS  FOR 5 DAYS    Report Status PENDING  Incomplete   Organism ID, Bacteria PSEUDOMONAS AERUGINOSA  Final   Organism ID, Bacteria VANCOMYCIN RESISTANT ENTEROCOCCUS  Final      Susceptibility   Pseudomonas aeruginosa - MIC*    CEFTAZIDIME 16 INTERMEDIATE Intermediate     CIPROFLOXACIN <=0.25 SENSITIVE Sensitive     GENTAMICIN <=1 SENSITIVE Sensitive     IMIPENEM 1 SENSITIVE Sensitive     CEFEPIME INTERMEDIATE Intermediate     * MODERATE PSEUDOMONAS AERUGINOSA   Vancomycin resistant enterococcus - MIC*    AMPICILLIN >=32 RESISTANT Resistant     VANCOMYCIN >=32 RESISTANT Resistant     GENTAMICIN SYNERGY SENSITIVE Sensitive     LINEZOLID 2 SENSITIVE Sensitive     * MODERATE VANCOMYCIN RESISTANT ENTEROCOCCUS  Blood culture (routine x 2)     Status: None   Collection Time: 05/02/18  6:20 PM  Result Value Ref Range Status   Specimen Description BLOOD LFA  Final   Special Requests   Final    BOTTLES DRAWN AEROBIC AND ANAEROBIC Blood Culture adequate volume   Culture   Final    NO GROWTH 5 DAYS Performed at [facility], [address],  [place], [place] [address]    Report Status 05/07/2018 FINAL  Final  Blood culture (routine x 2)     Status: None   Collection Time: 05/02/18  6:24 PM  Result Value Ref Range Status   Specimen Description BLOOD LWRIST  Final   Special Requests   Final    BOTTLES DRAWN AEROBIC AND ANAEROBIC Blood Culture adequate volume   Culture   Final    NO GROWTH 5 DAYS Performed at [facility], [address] [place], [place] [address]    Report Status 05/07/2018 FINAL  Final  MRSA PCR Screening     Status: None   Collection Time: 05/03/18  1:04 AM  Result Value Ref Range Status   MRSA by PCR NEGATIVE NEGATIVE Final    Comment:        The GeneXpert MRSA Assay (FDA approved for NASAL specimens only), is one component of a comprehensive MRSA colonization surveillance program. It is not intended to diagnose MRSA infection nor to guide or monitor treatment for MRSA infections. Performed at [facility], [address] [place], [place] [address]     Coagulation Studies: Recent Labs    05/08/18 0410  LABPROT 14.2  INR 1.11    Urinalysis: No results for input(s): COLORURINE, LABSPEC, PHURINE, GLUCOSEU, HGBUR, BILIRUBINUR, KETONESUR, PROTEINUR, UROBILINOGEN, NITRITE, LEUKOCYTESUR in the last 72 hours.  Invalid input(s): APPERANCEUR    Imaging: No results found.   Medications:   . sodium chloride    . piperacillin-tazobactam (ZOSYN)  IV 3.375 g (05/07/18 2333)  . vancomycin 500 mg (05/07/18 1627)   . apixaban  2.5 mg Oral BID  . aspirin EC  81 mg Oral Daily  . atorvastatin  80 mg Oral QHS  . brimonidine  1 drop Both Eyes TID  . calcitRIOL  0.5 mcg Oral Daily  . carvedilol  6.25 mg Oral BID WC  . docusate sodium  100 mg Oral BID  . epoetin (EPOGEN/PROCRIT) injection  4,000 Units Intravenous Q T,Th,Sa-HD  . famotidine  20 mg Oral Daily  . feeding supplement (NEPRO CARB STEADY)  237 mL Oral BID BM  . folic acid  1 mg Oral Daily  . insulin aspart  0-9 Units  Subcutaneous TID WC  . irbesartan  75 mg Oral Daily  . isosorbide mononitrate  30 mg Oral Daily  . levETIRAcetam  250 mg Oral Once per day on Tue Thu Sat  . levETIRAcetam  500 mg Oral BID  . midodrine  10 mg Oral Q T,Th,Sa-HD  . mirtazapine  7.5 mg Oral QHS  . multivitamin  1 tablet Oral QHS  . multivitamin-lutein  1 capsule Oral Daily  . omega-3 acid ethyl esters  2 capsule Oral Daily  . polyvinyl alcohol  1 drop Both Eyes BID  . rOPINIRole  0.25 mg Oral QHS  . sevelamer carbonate  800 mg Oral TID WC  . sodium chloride flush  3 mL Intravenous Q12H  . timolol  1 drop Both Eyes TID  . vitamin B-12  1,000 mcg Oral Daily  . vitamin C  500 mg Oral BID   sodium chloride, acetaminophen, hydrALAZINE, nitroGLYCERIN, ondansetron **OR** ondansetron (ZOFRAN) IV, sodium chloride flush, traMADol  Assessment/ Plan:  68 y.o. 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  No acute indication for dialysis today.  We will plan for dialysis again tomorrow if still here.  *Anemia of chronic kidney disease Continue Epogen as prescribed.  *Secondary hyperparathyroidism Check serum phosphorus if still here tomorrow and otherwise maintain the patient on current dosage of Renvela.   LOS: 6 Cuauhtemoc Huegel 1/17/202010:46 AM  [place], [place]  Note: This note was prepared with Dragon dictation. Any transcription errors are unintentional

## 2018-05-08 NOTE — Transfer of Care (Signed)
Immediate Anesthesia Transfer of Care Note  Patient: Stephanie Ellis  Procedure(s) Performed: DEBRIDEMENT OF LEFT LEG BKA (Left )  Patient Location: PACU  Anesthesia Type:General  Level of Consciousness: drowsy and patient cooperative  Airway & Oxygen Therapy: Patient Spontanous Breathing and Patient connected to face mask oxygen  Post-op Assessment: Report given to RN and Post -op Vital signs reviewed and stable  Post vital signs: stable  Last Vitals:  Vitals Value Taken Time  BP 156/52 05/08/2018  2:19 PM  Temp 36.9 C 05/08/2018  2:19 PM  Pulse 58 05/08/2018  2:22 PM  Resp 21 05/08/2018  2:22 PM  SpO2 100 % 05/08/2018  2:22 PM  Vitals shown include unvalidated device data.  Last Pain:  Vitals:   05/08/18 1223  TempSrc: Temporal  PainSc:       Patients Stated Pain Goal: 0 (90/47/53 3917)  Complications: No apparent anesthesia complications

## 2018-05-08 NOTE — Progress Notes (Signed)
Per Dr. Lucky Cowboy patient is refusing a wound vac. Clinical Education officer, museum (CSW) made [employer] [facility] aware of above. CSW also made [facility] aware that patient will likely need IV ABX.   [employer], LCSW [phone]

## 2018-05-08 NOTE — Care Management (Signed)
Patient was leaving for Debridement when I entered the room to assess, Patient most likely to DC back to facility when [facility]

## 2018-05-08 NOTE — Care Management Important Message (Signed)
Important Message  Patient Details  Name: Stephanie Ellis MRN: [id number] Date of Birth: 1950-09-18   Medicare Important Message Given:  Yes    Su Hilt, RN 05/08/2018, 12:12 PM

## 2018-05-08 NOTE — H&P (Signed)
[facility] History & Physical Update  The patient was interviewed and re-examined.  The patient's previous History and Physical has been reviewed and is unchanged.  There is no change in the plan of care. We plan to proceed with the scheduled procedure.  Leotis Pain, MD  05/08/2018, 1:02 PM

## 2018-05-08 NOTE — Anesthesia Post-op Follow-up Note (Signed)
Anesthesia QCDR form completed.        

## 2018-05-09 LAB — GLUCOSE, CAPILLARY
Glucose-Capillary: 154 mg/dL — ABNORMAL HIGH (ref 70–99)
Glucose-Capillary: 108 mg/dL — ABNORMAL HIGH (ref 70–99)
Glucose-Capillary: 215 mg/dL — ABNORMAL HIGH (ref 70–99)

## 2018-05-09 LAB — PHOSPHORUS: Phosphorus: 4.2 mg/dL (ref 2.5–4.6)

## 2018-05-09 LAB — VANCOMYCIN, RANDOM: Vancomycin Rm: 20

## 2018-05-09 NOTE — Progress Notes (Signed)
This note also relates to the following rows which could not be included: Pulse Rate - Cannot attach notes to unvalidated device data Resp - Cannot attach notes to unvalidated device data SpO2 - Cannot attach notes to unvalidated device data  Hd started

## 2018-05-09 NOTE — Progress Notes (Signed)
[facility] [place] NAME: Stephanie Ellis    MR#:  [id number]  DATE OF BIRTH:  06-05-1950  SUBJECTIVE:   Patient seen at dialysis today.  Status post debridement of the left stump wound yesterday.  Wound cultures are still pending.  No other acute events overnight, patient remains afebrile and hemodynamically stable.  REVIEW OF SYSTEMS:    Review of Systems  Constitutional: Negative for chills and fever.  HENT: Negative for congestion and tinnitus.   Eyes: Negative for blurred vision and double vision.  Respiratory: Negative for cough, shortness of breath and wheezing.   Cardiovascular: Negative for chest pain, orthopnea and PND.  Gastrointestinal: Negative for abdominal pain, diarrhea, nausea and vomiting.  Genitourinary: Negative for dysuria and hematuria.  Neurological: Negative for dizziness, sensory change and focal weakness.  All other systems reviewed and are negative.   Nutrition: Rneal/Carb Tolerating Diet: Yes  DRUG ALLERGIES:   Allergies  Allergen Reactions  . Chlorthalidone Anaphylaxis, Itching and Rash  . Fentanyl Rash  . Midazolam Rash  . Ace Inhibitors Other (See Comments)    Reaction:  Hyperkalemia, agitation   . Angiotensin Receptor Blockers Other (See Comments)    Reaction:  Hyperkalemia, agitation   . Norvasc [Amlodipine] Itching and Rash  . Phenergan [Promethazine Hcl] Anxiety    "antsy, can't sit still"    VITALS:  Blood pressure 125/61, pulse 63, temperature 97.6 F (36.4 C), resp. rate 17, height 5' 4"  (1.626 m), weight 45.4 kg, SpO2 97 %.  PHYSICAL EXAMINATION:   Physical Exam  GENERAL:  68 y.o.-year-old patient lying getting HD in NAD.  EYES: Pupils equal, round, reactive to light and accommodation. No scleral icterus. Extraocular muscles intact.  HEENT: Head atraumatic, normocephalic. Oropharynx and nasopharynx clear.  NECK:  Supple, no jugular venous distention. No thyroid enlargement, no  tenderness.  LUNGS: Normal breath sounds bilaterally, no wheezing, rales, rhonchi. No use of accessory muscles of respiration.  CARDIOVASCULAR: S1, S2 normal. No murmurs, rubs, or gallops.  ABDOMEN: Soft, nontender, nondistended. Bowel sounds present. No organomegaly or mass.  EXTREMITIES: No cyanosis, clubbing or edema b/l.  B/l BKA with left stump dressing in place from debridement yesterday NEUROLOGIC: Cranial nerves II through XII are intact. No focal Motor or sensory deficits b/l.   PSYCHIATRIC: The patient is alert and oriented x 3.  SKIN: No obvious rash, lesion, or ulcer.    LABORATORY PANEL:   CBC Recent Labs  Lab 05/08/18 0410  WBC 8.4  HGB 11.9*  HCT 41.0  PLT 273   ------------------------------------------------------------------------------------------------------------------  Chemistries  Recent Labs  Lab 05/02/18 1820  05/08/18 0410  NA 137   < > 139  K 2.7*   < > 3.4*  CL 99   < > 98  CO2 28   < > 32  GLUCOSE 98   < > 153*  BUN 6*   < > 16  CREATININE 2.05*   < > 4.16*  CALCIUM 9.1   < > 9.8  MG  --   --  1.9  AST 19  --   --   ALT 9  --   --   ALKPHOS 106  --   --   BILITOT 0.5  --   --    < > = values in this interval not displayed.   ------------------------------------------------------------------------------------------------------------------  Cardiac Enzymes No results for input(s): TROPONINI in the last 168 hours. ------------------------------------------------------------------------------------------------------------------  RADIOLOGY:  No results found.  ASSESSMENT AND PLAN:   68 year old female with past medical history of end-stage renal disease on hemodialysis, peripheral vascular disease status post bilateral BKA, recent infection of the left stump, glaucoma, hypertension, hyperlipidemia, diabetes, history of seizures who presented to the hospital due to foul-smelling drainage and suspected infection of the left BKA  stump.  1.  Infection of the left BKA stump- MRI of the left leg showing some osteitis but less likely osteomyelitis. -Continue IV Zosyn. -Status post irrigation and debridement of the left BKA stump POD # 1 today.  Patient refuses a wound VAC.  Continue wet-to-dry dressings daily and antibiotics with IV Zosyn. -Wound cultures were sent intraoperatively and are still pending.  -Follow-up with vascular surgery as an outpatient after discharge.  2.  End-stage renal disease on hemodialysis-patient gets dialysis on Tuesday Thursday and Saturday. - nephrology following and cont. Dialysis as per them.   3.  Essential hypertension-continue carvedilol, Avapro, Imdur.  - BP stable.   4.  Restless leg syndrome-continue Requip.  5.  Secondary hyperparathyroidism-continue Renvela.  6.  Hx of seizures-no acute seizure type activity.  Continue Keppra.  7.  Chronic hypotension- continue midodrine with dialysis  8.  Hypokalemia- improved w/ HD.    9.  Glaucoma- continue timolol, alphagan eyedrops.  Likely discharge to SNF once intra-operative wound cultures are back and can discuss abx with ID.   All the records are reviewed and case discussed with Care Management/Social Worker. Management plans discussed with the patient, family and they are in agreement.  CODE STATUS: Full code  DVT Prophylaxis: Eliquis  TOTAL TIME TAKING CARE OF THIS PATIENT: 30 minutes.   POSSIBLE D/C IN 2-3 DAYS, DEPENDING ON CLINICAL CONDITION.   Henreitta Leber M.D on 05/09/2018 at 2:04 PM  Between 7am to 6pm - Pager - [phone]  After 6pm go to [email or web address] - Proofreader  [facility]  Office  [phone]  CC: Primary care physician; Hanley Ben, NP

## 2018-05-09 NOTE — Progress Notes (Signed)
[facility], [place] 05/09/18  Subjective:  Patient seen and evaluated during dialysis treatment. Tolerating well. Did undergo debridement of her left BKA stump.   Objective:  Vital signs in last 24 hours:  Temp:  [97.9 F (36.6 C)-98.7 F (37.1 C)] 97.9 F (36.6 C) (01/18 1030) Pulse Rate:  [42-74] 64 (01/18 1115) Resp:  [9-18] 15 (01/18 1115) BP: (116-185)/(49-73) 124/57 (01/18 1115) SpO2:  [95 %-100 %] 97 % (01/18 1100) Weight:  [45.4 kg] 45.4 kg (01/18 1030)  Weight change:  Filed Weights   05/07/18 1017 05/07/18 1421 05/09/18 1030  Weight: 47 kg 45.9 kg 45.4 kg    Intake/Output:    Intake/Output Summary (Last 24 hours) at 05/09/2018 1137 Last data filed at 05/09/2018 0600 Gross per 24 hour  Intake 348.13 ml  Output 2 ml  Net 346.13 ml     Physical Exam: General:  No acute distress, laying in the bed  HEENT  moist oral mucous membranes  Pulm/lungs  room air, clear to auscultation  CVS/Heart  no rub or gallop  Abdomen:   Soft, nontender  Extremities:  Bilateral BKA, left stump dressed  Neurologic:  Alert, able to answer all questions appropriately  Skin:  No acute rashes  Access:  Left IJ PermCath       Basic Metabolic Panel:  Recent Labs  Lab 05/02/18 1820 05/03/18 0437 05/04/18 0240 05/05/18 0415 05/05/18 1406 05/08/18 0410 05/09/18 1050  NA 137 136  --  140  --  139  --   K 2.7* 2.8* 2.9* 3.7  --  3.4*  --   CL 99 99  --  102  --  98  --   CO2 28 27  --  27  --  32  --   GLUCOSE 98 150*  --  165*  --  153*  --   BUN 6* 10  --  35*  --  16  --   CREATININE 2.05* 2.93*  --  6.76*  --  4.16*  --   CALCIUM 9.1 9.5  --  10.4*  --  9.8  --   MG  --   --   --   --   --  1.9  --   PHOS  --   --   --   --  4.7*  --  4.2     CBC: Recent Labs  Lab 05/02/18 1820 05/03/18 0437 05/04/18 0240 05/07/18 0519 05/08/18 0410  WBC 9.2 10.1 9.0 8.0 8.4  NEUTROABS 6.2  --   --   --   --   HGB 11.4* 11.4* 9.7* 11.3* 11.9*   HCT 38.4 38.7 33.1* 39.7 41.0  MCV 97.0 96.0 96.2 98.5 94.7  PLT 300 307 260 231 273     No results found for: HEPBSAG, HEPBSAB, HEPBIGM    Microbiology:  Recent Results (from the past 240 hour(s))  Aerobic/Anaerobic Culture (surgical/deep wound)     Status: None (Preliminary result)   Collection Time: 05/02/18  6:20 PM  Result Value Ref Range Status   Specimen Description   Final    WOUND Performed at [facility], [address], [place], [place] [address]    Special Requests   Final    Normal Performed at [facility], [address] [place], [place] [address]    Gram Stain   Final    FEW WBC PRESENT, PREDOMINANTLY PMN ABUNDANT GRAM VARIABLE ROD    Culture   Final  MODERATE PSEUDOMONAS AERUGINOSA MODERATE VANCOMYCIN RESISTANT ENTEROCOCCUS NO ANAEROBES ISOLATED Sent to [place] for further susceptibility testing. Performed at [facility], [place] [address], [place], [place] [address]    Report Status PENDING  Incomplete   Organism ID, Bacteria PSEUDOMONAS AERUGINOSA  Final   Organism ID, Bacteria VANCOMYCIN RESISTANT ENTEROCOCCUS  Final      Susceptibility   Pseudomonas aeruginosa - MIC*    CEFTAZIDIME 16 INTERMEDIATE Intermediate     CIPROFLOXACIN <=0.25 SENSITIVE Sensitive     GENTAMICIN <=1 SENSITIVE Sensitive     IMIPENEM 1 SENSITIVE Sensitive     CEFEPIME INTERMEDIATE Intermediate     * MODERATE PSEUDOMONAS AERUGINOSA   Vancomycin resistant enterococcus - MIC*    AMPICILLIN >=32 RESISTANT Resistant     VANCOMYCIN >=32 RESISTANT Resistant     GENTAMICIN SYNERGY SENSITIVE Sensitive     LINEZOLID 2 SENSITIVE Sensitive     * MODERATE VANCOMYCIN RESISTANT ENTEROCOCCUS  Blood culture (routine x 2)     Status: None   Collection Time: 05/02/18  6:20 PM  Result Value Ref Range Status   Specimen Description BLOOD LFA  Final   Special Requests   Final    BOTTLES DRAWN AEROBIC AND ANAEROBIC Blood Culture adequate volume   Culture   Final     NO GROWTH 5 DAYS Performed at [facility], [address] [place], [place] [address]    Report Status 05/07/2018 FINAL  Final  Susceptibility, Aer + Anaerob     Status: None   Collection Time: 05/02/18  6:20 PM  Result Value Ref Range Status   Suscept, Aer + Anaerob Preliminary report  Final    Comment: (NOTE) Performed At: [facility] [place], [place] [id number] Rush Farmer MD [id number]    Source of Sample WOUND  Final    Comment: Performed at [facility], [place] [address], [place], [place] [address]  Susceptibility Result     Status: Abnormal   Collection Time: 05/02/18  6:20 PM  Result Value Ref Range Status   Suscept Result 1 Comment (A)  Final    Comment: (NOTE) Pseudomonas aeruginosa Performed At: [facility] [place], [place] [id number] Rush Farmer MD [id number]   Blood culture (routine x 2)     Status: None   Collection Time: 05/02/18  6:24 PM  Result Value Ref Range Status   Specimen Description BLOOD LWRIST  Final   Special Requests   Final    BOTTLES DRAWN AEROBIC AND ANAEROBIC Blood Culture adequate volume   Culture   Final    NO GROWTH 5 DAYS Performed at [facility], [address] [place], [place] [address]    Report Status 05/07/2018 FINAL  Final  MRSA PCR Screening     Status: None   Collection Time: 05/03/18  1:04 AM  Result Value Ref Range Status   MRSA by PCR NEGATIVE NEGATIVE Final    Comment:        The GeneXpert MRSA Assay (FDA approved for NASAL specimens only), is one component of a comprehensive MRSA colonization surveillance program. It is not intended to diagnose MRSA infection nor to guide or monitor treatment for MRSA infections. Performed at [facility], [address] [place], [place] [address]   Aerobic/Anaerobic Culture (surgical/deep wound)     Status: None (Preliminary result)   Collection Time: 05/08/18  1:55 PM  Result  Value Ref Range Status   Specimen Description WOUND  Final   Special Requests   Final    NONE Performed at [facility], [address] [place], [place] [address]    Gram Stain   Final    RARE WBC PRESENT, PREDOMINANTLY PMN RARE GRAM POSITIVE COCCI RARE GRAM POSITIVE RODS RARE GRAM VARIABLE ROD    Culture PENDING  Incomplete   Report Status PENDING  Incomplete    Coagulation Studies: Recent Labs    05/08/18 0410  LABPROT 14.2  INR 1.11    Urinalysis: No results for input(s): COLORURINE, LABSPEC, PHURINE, GLUCOSEU, HGBUR, BILIRUBINUR, KETONESUR, PROTEINUR, UROBILINOGEN, NITRITE, LEUKOCYTESUR in the last 72 hours.  Invalid input(s): APPERANCEUR    Imaging: No results found.   Medications:   . sodium chloride 20 mL (05/09/18 0145)  . levETIRAcetam 500 mg (05/09/18 0147)  . piperacillin-tazobactam (ZOSYN)  IV 3.375 g (05/09/18 0957)   . apixaban  2.5 mg Oral BID  . aspirin EC  81 mg Oral Daily  . atorvastatin  80 mg Oral QHS  . brimonidine  1 drop Both Eyes TID  . calcitRIOL  0.5 mcg Oral Daily  . carvedilol  6.25 mg Oral BID WC  . docusate sodium  100 mg Oral BID  . epoetin (EPOGEN/PROCRIT) injection  4,000 Units Intravenous Q T,Th,Sa-HD  . famotidine  20 mg Oral Daily  . feeding supplement (NEPRO CARB STEADY)  237 mL Oral BID BM  . folic acid  1 mg Oral Daily  . insulin aspart  0-9 Units Subcutaneous TID WC  . irbesartan  75 mg Oral Daily  . isosorbide mononitrate  30 mg Oral Daily  . levETIRAcetam  250 mg Oral Once per day on Tue Thu Sat  . midodrine  10 mg Oral Q T,Th,Sa-HD  . mirtazapine  7.5 mg Oral QHS  . multivitamin  1 tablet Oral QHS  . multivitamin-lutein  1 capsule Oral Daily  . omega-3 acid ethyl esters  2 capsule Oral Daily  . polyvinyl alcohol  1 drop Both Eyes BID  . rOPINIRole  0.25 mg Oral QHS  . sevelamer carbonate  800 mg Oral TID WC  . sodium chloride flush  3 mL Intravenous Q12H  . timolol  1 drop Both Eyes TID  . vitamin  B-12  1,000 mcg Oral Daily  . vitamin C  500 mg Oral BID   sodium chloride, acetaminophen, hydrALAZINE, nitroGLYCERIN, ondansetron **OR** ondansetron (ZOFRAN) IV, sodium chloride flush, traMADol  Assessment/ Plan:  68 y.o. African-American female with end stage renal disease on hemodialysis, hypertension, congestive heart failure, coronary artery disease status post CABG, diabetes mellitus type II insulin dependent, GERD, peripheral vascular disease status post right BKA, history of endocarditis and osteomyelitis  [facility] [place] TTS hemodialysis IJ permcath  *End-stage renal disease Patient seen and evaluated during hemodialysis.  Tolerating treatment well.  We plan to complete dialysis treatment today and next dialysis treatment will be on Tuesday if still here.  *Anemia of chronic kidney disease Maintain the patient on Epogen 4000 units IV with dialysis.  *Secondary hyperparathyroidism Serum phosphorus at target at 4.2.  Maintain the patient on current dosage of Renvela.   LOS: 7 Davonn Flanery 1/18/202011:37 AM  [place], [place]  Note: This note was prepared with Dragon dictation. Any transcription errors are unintentional

## 2018-05-09 NOTE — Progress Notes (Signed)
Pre dialysis assessment

## 2018-05-09 NOTE — Progress Notes (Signed)
This note also relates to the following rows which could not be included: Pulse Rate - Cannot attach notes to unvalidated device data Resp - Cannot attach notes to unvalidated device data  Hd completed

## 2018-05-09 NOTE — Progress Notes (Signed)
Post dialysis assessment

## 2018-05-09 NOTE — Progress Notes (Signed)
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 PMH includes CABG x3, MI, CHF, CAD, HTN, HLD, PVD, hx siezures, ESRD on HD T/R/Sat, and R BKA.    PT Comments    RN requesting help transferring pt to commode per her request.  Commode was placed at bedside with ant/post scoot transfer off of bed to commode.  +2 assist to hold commode stable and to assist with transfer.  Pt overall did very well with this technique but not have BM or void.  After returning to bed, she was able to lateral scoot transfer to recliner on the other side with min a x 1.  Pt much more engaged and following directions for tasks today.   Follow Up Recommendations  SNF     Equipment Recommendations  None recommended by PT    Recommendations for Other Services       Precautions / Restrictions Precautions Precautions: Fall Precaution Comments: NWB on BLE's Restrictions Weight Bearing Restrictions: Yes RLE Weight Bearing: Non weight bearing LLE Weight Bearing: Non weight bearing Other Position/Activity Restrictions: NWBing bilaterally 2/2 bilat BKA    Mobility  Bed Mobility Overal bed mobility: Needs Assistance Bed Mobility: Supine to Sit;Sit to Supine     Supine to sit: Min guard Sit to supine: Min guard      Transfers Overall transfer level: Needs assistance Equipment used: None Transfers: Lateral/Scoot Transfers;Anterior-Posterior Wellsite geologist transfers: [employer] physical assistance  Lateral/Scoot Transfers: Min assist;+2 physical assistance General transfer comment: Engaged in session and following directions well for transfers.  Ambulation/Gait             General Gait Details: B BK amp with no prostheses yet   Stairs             Wheelchair  Mobility    Modified Rankin (Stroke Patients Only)       Balance Overall balance assessment: Needs assistance Sitting-balance support: Bilateral upper extremity supported Sitting balance-Leahy Scale: Fair Sitting balance - Comments: steady with no LOB's                                    Cognition Arousal/Alertness: Awake/alert Behavior During Therapy: WFL for tasks assessed/performed Overall Cognitive Status: Within Functional Limits for tasks assessed                                        Exercises      General Comments        Pertinent Vitals/Pain Pain Assessment: No/denies pain    Home Living                      Prior Function            PT Goals (current goals can now be found in the care plan section) Progress towards PT goals: Progressing toward goals    Frequency    Min 2X/week      PT Plan      Co-evaluation              AM-PAC PT "6 Clicks" Mobility   Outcome Measure  Help needed turning from your back to your side while in a flat bed without using bedrails?: None Help needed moving from lying on your back to sitting on the side of a flat bed without using bedrails?: A Little Help needed moving to and from a bed to a chair (including a wheelchair)?: A Little Help needed standing up from a chair using your arms (e.g., wheelchair or bedside chair)?: Total Help needed to walk in hospital room?: Total Help needed climbing 3-5 steps with a railing? : Total 6 Click Score: 13    End of Session Equipment Utilized During Treatment: Gait belt Activity Tolerance: Patient tolerated treatment well Patient left: in chair;with call bell/phone within reach;with chair alarm set Nurse Communication: Mobility status       Time: 7225-7505 PT Time Calculation (min) (ACUTE ONLY): 18 min  Charges:  $Therapeutic Activity: 8-22 mins                     [name], PTA 05/09/18, 9:27 AM

## 2018-05-10 LAB — GLUCOSE, CAPILLARY
Glucose-Capillary: 123 mg/dL — ABNORMAL HIGH (ref 70–99)
Glucose-Capillary: 218 mg/dL — ABNORMAL HIGH (ref 70–99)
Glucose-Capillary: 108 mg/dL — ABNORMAL HIGH (ref 70–99)

## 2018-05-10 MED ORDER — LEVETIRACETAM 500 MG PO TABS
500.0000 mg | ORAL_TABLET | Freq: Two times a day (BID) | ORAL | Status: DC
  Administered 2018-05-10 – 2018-05-12 (×4): 500 mg via ORAL
  Filled 2018-05-10 (×5): qty 1

## 2018-05-10 NOTE — Progress Notes (Signed)
[facility], [place] 05/10/18  Subjective:  Patient sitting up in a chair. Appears comfortable today. Due for dialysis again on Tuesday.   Objective:  Vital signs in last 24 hours:  Temp:  [97.6 F (36.4 C)-98.4 F (36.9 C)] 98.2 F (36.8 C) (01/19 0752) Pulse Rate:  [56-77] 77 (01/19 0752) Resp:  [14-18] 18 (01/19 0752) BP: (102-180)/(50-77) 102/50 (01/19 0752) SpO2:  [97 %-100 %] 97 % (01/19 0752)  Weight change:  Filed Weights   05/07/18 1017 05/07/18 1421 05/09/18 1030  Weight: 47 kg 45.9 kg 45.4 kg    Intake/Output:    Intake/Output Summary (Last 24 hours) at 05/10/2018 1204 Last data filed at 05/09/2018 2300 Gross per 24 hour  Intake 50 ml  Output 996 ml  Net -946 ml     Physical Exam: General:  No acute distress, sitting up in chair  HEENT  moist oral mucous membranes  Pulm/lungs  CTAB normal effort  CVS/Heart  S1S2 no rubs  Abdomen:   Soft, nontender  Extremities:  Bilateral BKA, left stump dressed  Neurologic:  Awake, alert, following commands  Skin:  No acute rashes  Access:  Left IJ PermCath       Basic Metabolic Panel:  Recent Labs  Lab 05/04/18 0240 05/05/18 0415 05/05/18 1406 05/08/18 0410 05/09/18 1050  NA  --  140  --  139  --   K 2.9* 3.7  --  3.4*  --   CL  --  102  --  98  --   CO2  --  27  --  32  --   GLUCOSE  --  165*  --  153*  --   BUN  --  35*  --  16  --   CREATININE  --  6.76*  --  4.16*  --   CALCIUM  --  10.4*  --  9.8  --   MG  --   --   --  1.9  --   PHOS  --   --  4.7*  --  4.2     CBC: Recent Labs  Lab 05/04/18 0240 05/07/18 0519 05/08/18 0410  WBC 9.0 8.0 8.4  HGB 9.7* 11.3* 11.9*  HCT 33.1* 39.7 41.0  MCV 96.2 98.5 94.7  PLT 260 231 273     No results found for: HEPBSAG, HEPBSAB, HEPBIGM    Microbiology:  Recent Results (from the past 240 hour(s))  Aerobic/Anaerobic Culture (surgical/deep wound)     Status: None (Preliminary result)   Collection Time: 05/02/18   6:20 PM  Result Value Ref Range Status   Specimen Description   Final    WOUND Performed at [facility], [address] [place], [place] [address]    Special Requests   Final    Normal Performed at [facility], [address] [place], [place] [address]    Gram Stain   Final    FEW WBC PRESENT, PREDOMINANTLY PMN ABUNDANT GRAM VARIABLE ROD    Culture   Final    MODERATE PSEUDOMONAS AERUGINOSA MODERATE VANCOMYCIN RESISTANT ENTEROCOCCUS NO ANAEROBES ISOLATED Sent to [place] for further susceptibility testing. Performed at [facility], [place] [address], [place], [place] [address]    Report Status PENDING  Incomplete   Organism ID, Bacteria PSEUDOMONAS AERUGINOSA  Final   Organism ID, Bacteria VANCOMYCIN RESISTANT ENTEROCOCCUS  Final      Susceptibility   Pseudomonas aeruginosa - MIC*    CEFTAZIDIME 16  INTERMEDIATE Intermediate     CIPROFLOXACIN <=0.25 SENSITIVE Sensitive     GENTAMICIN <=1 SENSITIVE Sensitive     IMIPENEM 1 SENSITIVE Sensitive     CEFEPIME INTERMEDIATE Intermediate     * MODERATE PSEUDOMONAS AERUGINOSA   Vancomycin resistant enterococcus - MIC*    AMPICILLIN >=32 RESISTANT Resistant     VANCOMYCIN >=32 RESISTANT Resistant     GENTAMICIN SYNERGY SENSITIVE Sensitive     LINEZOLID 2 SENSITIVE Sensitive     * MODERATE VANCOMYCIN RESISTANT ENTEROCOCCUS  Blood culture (routine x 2)     Status: None   Collection Time: 05/02/18  6:20 PM  Result Value Ref Range Status   Specimen Description BLOOD LFA  Final   Special Requests   Final    BOTTLES DRAWN AEROBIC AND ANAEROBIC Blood Culture adequate volume   Culture   Final    NO GROWTH 5 DAYS Performed at [facility], [address] [place], [place] [address]    Report Status 05/07/2018 FINAL  Final  Susceptibility, Aer + Anaerob     Status: None   Collection Time: 05/02/18  6:20 PM  Result Value Ref Range Status   Suscept, Aer + Anaerob Preliminary report  Final    Comment:  (NOTE) Performed At: [facility] [place], [place] [id number] Rush Farmer MD [id number]    Source of Sample WOUND  Final    Comment: Performed at [facility], [place] [address] [place], [place] [address]  Susceptibility Result     Status: Abnormal   Collection Time: 05/02/18  6:20 PM  Result Value Ref Range Status   Suscept Result 1 Comment (A)  Final    Comment: (NOTE) Pseudomonas aeruginosa Performed At: [facility] [place], [place] [id number] Rush Farmer MD [id number]   Blood culture (routine x 2)     Status: None   Collection Time: 05/02/18  6:24 PM  Result Value Ref Range Status   Specimen Description BLOOD LWRIST  Final   Special Requests   Final    BOTTLES DRAWN AEROBIC AND ANAEROBIC Blood Culture adequate volume   Culture   Final    NO GROWTH 5 DAYS Performed at [facility], [address] [place], [place] [address]    Report Status 05/07/2018 FINAL  Final  MRSA PCR Screening     Status: None   Collection Time: 05/03/18  1:04 AM  Result Value Ref Range Status   MRSA by PCR NEGATIVE NEGATIVE Final    Comment:        The GeneXpert MRSA Assay (FDA approved for NASAL specimens only), is one component of a comprehensive MRSA colonization surveillance program. It is not intended to diagnose MRSA infection nor to guide or monitor treatment for MRSA infections. Performed at [facility], [address] [place], [place] [address]   Aerobic/Anaerobic Culture (surgical/deep wound)     Status: None (Preliminary result)   Collection Time: 05/08/18  1:55 PM  Result Value Ref Range Status   Specimen Description   Final    WOUND Performed at [facility], [address] [place], [place] [address]    Special Requests   Final    NONE Performed at [facility], [address] [place], [place] [address]    Gram Stain   Final    RARE WBC PRESENT, PREDOMINANTLY  PMN RARE GRAM POSITIVE COCCI RARE GRAM POSITIVE RODS RARE GRAM VARIABLE ROD    Culture  Final    MODERATE PSEUDOMONAS AERUGINOSA NO ANAEROBES ISOLATED; CULTURE IN PROGRESS FOR 5 DAYS    Report Status PENDING  Incomplete   Organism ID, Bacteria PSEUDOMONAS AERUGINOSA  Final      Susceptibility   Pseudomonas aeruginosa - MIC*    CEFTAZIDIME >=64 RESISTANT Resistant     CIPROFLOXACIN <=0.25 SENSITIVE Sensitive     GENTAMICIN <=1 SENSITIVE Sensitive     IMIPENEM <=0.25 SENSITIVE Sensitive     CEFEPIME 32 RESISTANT Resistant     * MODERATE PSEUDOMONAS AERUGINOSA    Coagulation Studies: Recent Labs    05/08/18 0410  LABPROT 14.2  INR 1.11    Urinalysis: No results for input(s): COLORURINE, LABSPEC, PHURINE, GLUCOSEU, HGBUR, BILIRUBINUR, KETONESUR, PROTEINUR, UROBILINOGEN, NITRITE, LEUKOCYTESUR in the last 72 hours.  Invalid input(s): APPERANCEUR    Imaging: No results found.   Medications:   . sodium chloride 100 mL (05/10/18 0328)  . levETIRAcetam 500 mg (05/10/18 0329)  . piperacillin-tazobactam (ZOSYN)  IV 3.375 g (05/10/18 0857)   . apixaban  2.5 mg Oral BID  . aspirin EC  81 mg Oral Daily  . atorvastatin  80 mg Oral QHS  . brimonidine  1 drop Both Eyes TID  . calcitRIOL  0.5 mcg Oral Daily  . carvedilol  6.25 mg Oral BID WC  . docusate sodium  100 mg Oral BID  . epoetin (EPOGEN/PROCRIT) injection  4,000 Units Intravenous Q T,Th,Sa-HD  . famotidine  20 mg Oral Daily  . feeding supplement (NEPRO CARB STEADY)  237 mL Oral BID BM  . folic acid  1 mg Oral Daily  . insulin aspart  0-9 Units Subcutaneous TID WC  . irbesartan  75 mg Oral Daily  . isosorbide mononitrate  30 mg Oral Daily  . levETIRAcetam  250 mg Oral Once per day on Tue Thu Sat  . midodrine  10 mg Oral Q T,Th,Sa-HD  . mirtazapine  7.5 mg Oral QHS  . multivitamin  1 tablet Oral QHS  . multivitamin-lutein  1 capsule Oral Daily  . omega-3 acid ethyl esters  2 capsule Oral Daily  . polyvinyl  alcohol  1 drop Both Eyes BID  . rOPINIRole  0.25 mg Oral QHS  . sevelamer carbonate  800 mg Oral TID WC  . sodium chloride flush  3 mL Intravenous Q12H  . timolol  1 drop Both Eyes TID  . vitamin B-12  1,000 mcg Oral Daily  . vitamin C  500 mg Oral BID   sodium chloride, acetaminophen, hydrALAZINE, nitroGLYCERIN, ondansetron **OR** ondansetron (ZOFRAN) IV, sodium chloride flush, traMADol  Assessment/ Plan:  68 y.o. African-American female with end stage renal disease on hemodialysis, hypertension, congestive heart failure, coronary artery disease status post CABG, diabetes mellitus type II insulin dependent, GERD, peripheral vascular disease status post right BKA, history of endocarditis and osteomyelitis  [facility] [place] TTS hemodialysis IJ permcath  *End-stage renal disease Patient seen at bedside.  No acute indication for dialysis today.  We will plan for dialysis again on Tuesday if still here.  *Anemia of chronic kidney disease No new hemoglobin today.  Continue Epogen 4000 units with dialysis.  *Secondary hyperparathyroidism Phosphorus under good control at 4.2.  Continue Renvela.   LOS: 8 Lashina Milles 1/19/202012:04 PM  [place], [place]  Note: This note was prepared with Dragon dictation. Any transcription errors are unintentional

## 2018-05-10 NOTE — Progress Notes (Addendum)
Pnt again refused 2200 PO medications tonight. Pnt is intermittently confused about where she is as she goes in and out of sleep throughout the night. Educated pnt on importance of taking her meds as prescribed but she continued to refuse.  IV Antibiotics and Keppra given.   POD 2 from I&D to L BKA. Wound dressing changed at approx 0250 due to pnt being found in bed with dressing pulled off. Cleansed wound and redressed. Wound bed with significant amount of slough(yellow), minimal drainage (less today than 1/18). Pnt tolerates dressing change very well, reports no pain with dressing change.  Will continue to monitor and assess wound to ensure compliance with leaving on the dressing.

## 2018-05-10 NOTE — Progress Notes (Signed)
[facility] [place] NAME: Stephanie Ellis    MR#:  [id number]  DATE OF BIRTH:  [birth year]  SUBJECTIVE:   Patient sitting up in chair in no apparent distress.  No acute events overnight.  Deep wound cultures are still growing Pseudomonas.  Dressing was changed by nursing staff this morning.  REVIEW OF SYSTEMS:    Review of Systems  Constitutional: Negative for chills and fever.  HENT: Negative for congestion and tinnitus.   Eyes: Negative for blurred vision and double vision.  Respiratory: Negative for cough, shortness of breath and wheezing.   Cardiovascular: Negative for chest pain, orthopnea and PND.  Gastrointestinal: Negative for abdominal pain, diarrhea, nausea and vomiting.  Genitourinary: Negative for dysuria and hematuria.  Neurological: Negative for dizziness, sensory change and focal weakness.  All other systems reviewed and are negative.   Nutrition: Rneal/Carb Tolerating Diet: Yes  DRUG ALLERGIES:   Allergies  Allergen Reactions  . Chlorthalidone Anaphylaxis, Itching and Rash  . Fentanyl Rash  . Midazolam Rash  . Ace Inhibitors Other (See Comments)    Reaction:  Hyperkalemia, agitation   . Angiotensin Receptor Blockers Other (See Comments)    Reaction:  Hyperkalemia, agitation   . Norvasc [Amlodipine] Itching and Rash  . Phenergan [Promethazine Hcl] Anxiety    "antsy, can't sit still"    VITALS:  Blood pressure (!) 102/50, pulse 77, temperature 98.2 F (36.8 C), resp. rate 18, height 5' 4"  (1.626 m), weight 45.4 kg, SpO2 97 %.  PHYSICAL EXAMINATION:   Physical Exam  GENERAL:  68 y.o.-year-old patient lying getting HD in NAD.  EYES: Pupils equal, round, reactive to light and accommodation. No scleral icterus. Extraocular muscles intact.  HEENT: Head atraumatic, normocephalic. Oropharynx and nasopharynx clear.  NECK:  Supple, no jugular venous distention. No thyroid enlargement, no tenderness.  LUNGS:  Normal breath sounds bilaterally, no wheezing, rales, rhonchi. No use of accessory muscles of respiration.  CARDIOVASCULAR: S1, S2 normal. No murmurs, rubs, or gallops.  ABDOMEN: Soft, nontender, nondistended. Bowel sounds present. No organomegaly or mass.  EXTREMITIES: No cyanosis, clubbing or edema b/l.  B/l BKA with left stump dressing in place from recent debridement with no acute drainage noted. NEUROLOGIC: Cranial nerves II through XII are intact. No focal Motor or sensory deficits b/l.   PSYCHIATRIC: The patient is alert and oriented x 3.  SKIN: No obvious rash, lesion, or ulcer.    LABORATORY PANEL:   CBC Recent Labs  Lab 05/08/18 0410  WBC 8.4  HGB 11.9*  HCT 41.0  PLT 273   ------------------------------------------------------------------------------------------------------------------  Chemistries  Recent Labs  Lab 05/08/18 0410  NA 139  K 3.4*  CL 98  CO2 32  GLUCOSE 153*  BUN 16  CREATININE 4.16*  CALCIUM 9.8  MG 1.9   ------------------------------------------------------------------------------------------------------------------  Cardiac Enzymes No results for input(s): TROPONINI in the last 168 hours. ------------------------------------------------------------------------------------------------------------------  RADIOLOGY:  No results found.   ASSESSMENT AND PLAN:   68 year old female with past medical history of end-stage renal disease on hemodialysis, peripheral vascular disease status post bilateral BKA, recent infection of the left stump, glaucoma, hypertension, hyperlipidemia, diabetes, history of seizures who presented to the hospital due to foul-smelling drainage and suspected infection of the left BKA stump.  1.  Infection of the left BKA stump- MRI of the left leg showing some osteitis but less likely osteomyelitis. -Continue IV Zosyn. -Status post irrigation and debridement of the left BKA stump POD #  2 today.  Patient refused a wound  VAC.  Continue wet-to-dry dressings daily and antibiotics with IV Zosyn. -Intraoperative deep wound cultures are still growing Pseudomonas.  Will discuss with infectious disease tomorrow regarding longevity of treatment also versus intravenous or oral therapy. -Follow-up with vascular surgery as an outpatient after discharge.  2.  End-stage renal disease on hemodialysis-patient gets dialysis on Tuesday Thursday and Saturday. - nephrology following and cont. Dialysis as per them.   3.  Essential hypertension-continue carvedilol, Avapro, Imdur.  - BP stable.   4.  Restless leg syndrome-continue Requip.  5.  Secondary hyperparathyroidism-continue Renvela.  6.  Hx of seizures-no acute seizure type activity.  Continue Keppra.  7.  Chronic hypotension- continue midodrine with dialysis  8.  Hypokalemia- improved w/ HD.    9.  Glaucoma- continue timolol, alphagan eyedrops.   All the records are reviewed and case discussed with Care Management/Social Worker. Management plans discussed with the patient, family and they are in agreement.  CODE STATUS: Full code  DVT Prophylaxis: Eliquis  TOTAL TIME TAKING CARE OF THIS PATIENT: 25 minutes.   POSSIBLE D/C IN 1-2 DAYS, DEPENDING ON CLINICAL CONDITION.   Henreitta Leber M.D on 05/10/2018 at 12:33 PM  Between 7am to 6pm - Pager - [phone]  After 6pm go to [email or web address] - Proofreader  [facility]  Office  [phone]  CC: Primary care physician; Hanley Ben, NP

## 2018-05-11 ENCOUNTER — Encounter: Payer: Self-pay | Admitting: Vascular Surgery

## 2018-05-11 DIAGNOSIS — Z1624 Resistance to multiple antibiotics: Secondary | ICD-10-CM

## 2018-05-11 LAB — CBC
HCT: 38.5 % (ref 36.0–46.0)
Hemoglobin: 11.2 g/dL — ABNORMAL LOW (ref 12.0–15.0)
MCH: 27.9 pg (ref 26.0–34.0)
MCHC: 29.1 g/dL — ABNORMAL LOW (ref 30.0–36.0)
MCV: 95.8 fL (ref 80.0–100.0)
Platelets: 299 10*3/uL (ref 150–400)
RBC: 4.02 MIL/uL (ref 3.87–5.11)
RDW: 18.5 % — ABNORMAL HIGH (ref 11.5–15.5)
WBC: 11.1 10*3/uL — ABNORMAL HIGH (ref 4.0–10.5)
nRBC: 0 % (ref 0.0–0.2)

## 2018-05-11 LAB — LYME, WESTERN BLOT, SERUM (REFLEXED)
IgG P23 Ab.: ABSENT
IgG P28 Ab.: ABSENT
IgG P30 Ab.: ABSENT
IgG P39 Ab.: ABSENT
IgG P41 Ab.: ABSENT
IgG P45 Ab.: ABSENT
IgG P58 Ab.: ABSENT
IgG P66 Ab.: ABSENT
IgM P39 Ab.: ABSENT
IgM P41 Ab.: ABSENT
Lyme IgG Wb: NEGATIVE
Lyme IgM Wb: NEGATIVE

## 2018-05-11 LAB — BASIC METABOLIC PANEL
Anion gap: 12 (ref 5–15)
BUN: 40 mg/dL — ABNORMAL HIGH (ref 8–23)
CO2: 29 mmol/L (ref 22–32)
Calcium: 11 mg/dL — ABNORMAL HIGH (ref 8.9–10.3)
Chloride: 96 mmol/L — ABNORMAL LOW (ref 98–111)
Creatinine, Ser: 6.51 mg/dL — ABNORMAL HIGH (ref 0.44–1.00)
GFR calc Af Amer: 7 mL/min — ABNORMAL LOW (ref >60)
GFR calc non Af Amer: 6 mL/min — ABNORMAL LOW (ref >60)
Glucose, Bld: 224 mg/dL — ABNORMAL HIGH (ref 70–99)
Potassium: 4.4 mmol/L (ref 3.5–5.1)
Sodium: 137 mmol/L (ref 135–145)

## 2018-05-11 LAB — GLUCOSE, CAPILLARY
Glucose-Capillary: 159 mg/dL — ABNORMAL HIGH (ref 70–99)
Glucose-Capillary: 147 mg/dL — ABNORMAL HIGH (ref 70–99)
Glucose-Capillary: 240 mg/dL — ABNORMAL HIGH (ref 70–99)
Glucose-Capillary: 150 mg/dL — ABNORMAL HIGH (ref 70–99)
Glucose-Capillary: 127 mg/dL — ABNORMAL HIGH (ref 70–99)

## 2018-05-11 LAB — B. BURGDORFI ANTIBODIES: B burgdorferi Ab IgG+IgM: 1.05 {ISR} — ABNORMAL HIGH (ref 0.00–0.90)

## 2018-05-11 MED ORDER — METRONIDAZOLE 500 MG PO TABS
500.0000 mg | ORAL_TABLET | Freq: Two times a day (BID) | ORAL | Status: DC
  Administered 2018-05-11 – 2018-05-12 (×3): 500 mg via ORAL
  Filled 2018-05-11 (×4): qty 1

## 2018-05-11 MED ORDER — CIPROFLOXACIN HCL 500 MG PO TABS
500.0000 mg | ORAL_TABLET | ORAL | Status: DC
  Administered 2018-05-11 – 2018-05-12 (×2): 500 mg via ORAL
  Filled 2018-05-11 (×2): qty 1

## 2018-05-11 NOTE — Progress Notes (Addendum)
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 PMH includes CABG x3, MI, CHF, CAD, HTN, HLD, PVD, hx siezures, ESRD on HD T/R/Sat, and R BKA.    PT Comments    Pt presents with deficits in strength, transfers, mobility, and activity tolerance.  Pt required extra time and effort and some general cues for sequencing during bed mobility tasks but no physical assistance.  Pt required min A with lateral scoot transfers at the EOB along with cues for sequencing.  Pt required extra verbal and/or tactile cues with below therex for proper technique and to stay on tasks but performed the exercises without adverse symptoms.  Pt will benefit from PT services in a SNF setting upon discharge to safely address above deficits for decreased caregiver assistance and eventual return to PLOF.     Follow Up Recommendations  SNF     Equipment Recommendations  None recommended by PT    Recommendations for Other Services       Precautions / Restrictions Precautions Precautions: Fall Precaution Comments: NWB on BLE's Restrictions Weight Bearing Restrictions: Yes RLE Weight Bearing: Non weight bearing LLE Weight Bearing: Non weight bearing Other Position/Activity Restrictions: NWBing bilaterally 2/2 bilat BKA    Mobility  Bed Mobility Overal bed mobility: Needs Assistance Bed Mobility: Supine to Sit;Sit to Supine     Supine to sit: Supervision Sit to supine: Supervision   General bed mobility comments: Extra time and effort required durnig sup to/from sit but no physical assistance.   Transfers Overall transfer level: Needs assistance Equipment used: None Transfers: Lateral/Scoot Transfers          Lateral/Scoot Transfers: Min assist General transfer comment: Mod verbal cues for  sequencing with min A to laterally scoot at the EOB  Ambulation/Gait             General Gait Details: Unable/unsafe to attempt   Stairs             Wheelchair Mobility    Modified Rankin (Stroke Patients Only)       Balance Overall balance assessment: Needs assistance Sitting-balance support: No upper extremity supported Sitting balance-Leahy Scale: Good Sitting balance - Comments: steady with no LOB's                                    Cognition Arousal/Alertness: Awake/alert Behavior During Therapy: WFL for tasks assessed/performed Overall Cognitive Status: No family/caregiver present to determine baseline cognitive functioning                                        Exercises Total Joint Exercises Quad Sets: Strengthening;Both;10 reps;15 reps(Verbal and/or tactile cues during the session for proper technique and to stay on task for below therex) Gluteal Sets: Strengthening;Both;10 reps;15 reps Towel Squeeze: Strengthening;Both;10 reps Short Arc Quad: Strengthening;Both;10 reps Hip ABduction/ADduction: Strengthening;Both;10 reps(manual resistance) Straight Leg Raises: Both;10 reps;5 reps Long Arc Quad: AROM;Both;10 reps;15 reps Knee Flexion: AROM;Both;10 reps;15 reps Other Exercises Other Exercises: HEP for BLE QS and GS x 10 each 5-6x/day    General Comments        Pertinent Vitals/Pain Pain Assessment: No/denies pain  Home Living                      Prior Function            PT Goals (current goals can now be found in the care plan section) Progress towards PT goals: Progressing toward goals    Frequency    Min 2X/week      PT Plan Current plan remains appropriate    Co-evaluation              AM-PAC PT "6 Clicks" Mobility   Outcome Measure  Help needed turning from your back to your side while in a flat bed without using bedrails?: None Help needed moving from lying on your back  to sitting on the side of a flat bed without using bedrails?: A Little Help needed moving to and from a bed to a chair (including a wheelchair)?: A Little Help needed standing up from a chair using your arms (e.g., wheelchair or bedside chair)?: Total Help needed to walk in hospital room?: Total Help needed climbing 3-5 steps with a railing? : Total 6 Click Score: 13    End of Session   Activity Tolerance: Patient tolerated treatment well Patient left: in bed;with bed alarm set;with call bell/phone within reach Nurse Communication: Mobility status PT Visit Diagnosis: Muscle weakness (generalized) (M62.81)     Time: 9741-6384 PT Time Calculation (min) (ACUTE ONLY): 23 min  Charges:  $Therapeutic Exercise: 8-22 mins $Therapeutic Activity: 8-22 mins                     D. Scott Faylynn Stamos PT, DPT 05/11/18, 3:48 PM

## 2018-05-11 NOTE — Progress Notes (Signed)
Patient is now agreeable to have a wound vac. [facility] admissions coordinator at [facility] is aware of above and will order a wound vac.   [employer], LCSW [phone]

## 2018-05-11 NOTE — Consult Note (Signed)
[facility] Nurse wound consult note Patient receiving care in [facility].  No family present. Reason for Consult: Application of NPWT to left stump surgical site Wound type: Surgical Measurement: 7.5 cm x 16 cm x 1.5 cm Wound bed: 99% pink, 1% yellow with bone exposed Drainage (amount, consistency, odor) None on existing saline moistened gauze Periwound: Intact. Dressing procedure/placement/frequency: The majority of a medium black foam dressing was placed into the wound bed.  There are two pieces of black foam, one large, one small filler piece along the medial wound border.  A no leak seal was immediately obtained.  The dressing was secured with a lightly wrapped ace wrap.  The patient tolerated the procedure extremely well.  Therapy was placed at 125 mm Hg pressure. Val Riles, RN, MSN, CWOCN, CNS-BC, pager [phone]

## 2018-05-11 NOTE — Care Management Important Message (Signed)
Important Message  Patient Details  Name: Stephanie Ellis MRN: [id number] Date of Birth: 05/04/1950   Medicare Important Message Given:  Yes    Su Hilt, RN 05/11/2018, 11:42 AM

## 2018-05-11 NOTE — Progress Notes (Signed)
Date of Admission:  05/02/2018      ID: Stephanie Ellis is a 68 y.o. 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  As per patient she had a fall and the surgical incision on the left side dehisced and since then she has had a wound with foul-smelling discharge.? Had debridement on 05/08/18 Culture positive for pseudomonas resistant to current antibiotic zosyn and also to cephalosporins- S to cipro and imipenem Pt had refused wound vac after the debridement   Subjective: Feeling okay No fever or chills No diarrhea  Medications:  . apixaban  2.5 mg Oral BID  . aspirin EC  81 mg Oral Daily  . atorvastatin  80 mg Oral QHS  . brimonidine  1 drop Both Eyes TID  . calcitRIOL  0.5 mcg Oral Daily  . carvedilol  6.25 mg Oral BID WC  . ciprofloxacin  500 mg Oral Q24H  . docusate sodium  100 mg Oral BID  . famotidine  20 mg Oral Daily  . feeding supplement (NEPRO CARB STEADY)  237 mL Oral BID BM  . folic acid  1 mg Oral Daily  . insulin aspart  0-9 Units Subcutaneous TID WC  . irbesartan  75 mg Oral Daily  . isosorbide mononitrate  30 mg Oral Daily  . levETIRAcetam  250 mg Oral Once per day on Tue Thu Sat  . levETIRAcetam  500 mg Oral BID  . metroNIDAZOLE  500 mg Oral Q12H  . midodrine  10 mg Oral Q T,Th,Sa-HD  . mirtazapine  7.5 mg Oral QHS  . multivitamin  1 tablet Oral QHS  . multivitamin-lutein  1 capsule Oral Daily  . omega-3 acid ethyl esters  2 capsule Oral Daily  . polyvinyl alcohol  1 drop Both Eyes BID  . rOPINIRole  0.25 mg Oral QHS  . sevelamer carbonate  800 mg Oral TID WC  . sodium chloride flush  3 mL Intravenous Q12H  . timolol  1 drop Both Eyes TID  . vitamin B-12  1,000 mcg Oral Daily  . vitamin C  500  mg Oral BID    Objective: Vital signs in last 24 hours: Temp:  [98.1 F (36.7 C)] 98.1 F (36.7 C) (01/20 0700) Pulse Rate:  [64-77] 71 (01/20 0700) Resp:  [18] 18 (01/19 2315) BP: (155-173)/(58-60) 170/58 (01/20 0700) SpO2:  [98 %-99 %] 99 % (01/20 0700)  PHYSICAL EXAM:  General: Alert, cooperative, slow to respond no distress, appears stated age, pale.  Head: Normocephalic, without obvious abnormality, atraumatic. Eyes: left eye, pupil irregularity, minimal erythema ENT Nares normal. No drainage or sinus tenderness. Lips, mucosa, and tongue normal. No Thrush Neck: Supple, symmetrical, no adenopathy, thyroid: non tender no carotid bruit and no JVD. Lungs: b/l air entry Heart:s1s2 Abdomen: Soft, Extremities: b/l BKA- rt stump healed well Left stump wound debrided, slough and eschar has been removed  05/11/18   05/05/18    Skin: No rashes or lesions. Or bruising Lymph: Cervical, supraclavicular normal. Neurologic: Grossly non-focal  Lab Results Recent Labs    05/11/18 1014  WBC 11.1*  HGB 11.2*  HCT 38.5  NA 137  K 4.4  CL 96*  CO2 29  BUN 40*  CREATININE 6.51*   Microbiology: Pseudomonas in the  surgical wound culture- S to cipro, gent and imipenem Studies/Results: MRI-no osteo  Assessment/Plan: 68 y.o. 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  As per patient she had a fall and the surgical incision on the left side dehisced and since then she has had a wound with foul-smelling discharge.? ? ?Infected stump of the left BKA.   underlying bone  not involved  S/p debridement- pt had initially refused wound vac because of placement and pain( she had one before)  I spoke to her daughter (  at her request) and daughter said she is going to [facility] where they can manage wound vac. Pt willing to try wound vac after her daughter spoke to her  Pseudomonas in culture- will Dc zosyn and start cipro 587m once a day  along with flagyl 5077mPO BID for 10 days Discussed with daughter the other option would be a central line and meropenem- she does not want that. Discussed the side effects including confusion, tendinitis, muscle pain, fluctuating glucose and diarrhea. If she cannot tolerate cipro will gave to do gentamycin ( which may not be the best option) during dialysis  Right BKA site healthy  End-stage renal disease on dialysis  Peripheral arterial disease  Diabetes mellitus management as per primary team  Coronary artery disease status post CABG CHF on carvedilol, Avapro, isosorbide mononitrate, Midodrin  Depression on mirtazapine  Pt seen with her nurse- discussed with her and her daughter ( Stephanie Halonon the phone and with Dr.Sainani

## 2018-05-11 NOTE — Anesthesia Postprocedure Evaluation (Signed)
Anesthesia Post Note  Patient: Mercia Dowe  Procedure(s) Performed: DEBRIDEMENT OF LEFT LEG BKA (Left )  Patient location during evaluation: PACU Anesthesia Type: General Level of consciousness: awake and alert Pain management: pain level controlled Vital Signs Assessment: post-procedure vital signs reviewed and stable Respiratory status: spontaneous breathing, nonlabored ventilation, respiratory function stable and patient connected to nasal cannula oxygen Cardiovascular status: blood pressure returned to baseline and stable Postop Assessment: no apparent nausea or vomiting Anesthetic complications: no     Last Vitals:  Vitals:   05/11/18 0700 05/11/18 1649  BP: (!) 170/58 (!) 136/55  Pulse: 71 62  Resp:    Temp: 36.7 C 36.5 C  SpO2: 99% 99%    Last Pain:  Vitals:   05/11/18 1649  TempSrc: Oral  PainSc:                  Martha Clan

## 2018-05-11 NOTE — Progress Notes (Signed)
Per MD patient will not discharge with a wound vac because patient is refusing it. Clinical Education officer, museum (CSW) contacted [employer] at [facility] and made her aware of above. Per Magda Paganini she called patient's daughter [name] and discussed signing over patient's social security check to the facility. Per Magda Paganini patient can return to [facility] when medically stable.   [employer], LCSW [phone]

## 2018-05-11 NOTE — Progress Notes (Signed)
[facility] Daily Progress Note   Subjective: 3 Days Post-Op: Irrigation and debridement of left BKA stump of skin, soft tissue, and muscle for approximately 100cm2  Patient without complaint this afternoon. Patient is now agreeable to [facility] placement. She understands this is the best modality for wound healing vs wet to dry dressings. Patient to be discharged to [facility] once [facility] is placed and medical stable.   Objective: Vitals:   05/10/18 0752 05/10/18 1651 05/10/18 2315 05/11/18 0700  BP: (!) 102/50 (!) 155/60 (!) 173/60 (!) 170/58  Pulse: 77 77 64 71  Resp: 18 18 18    Temp: 98.2 F (36.8 C)   98.1 F (36.7 C)  TempSrc:    Oral  SpO2: 97% 98% 99% 99%  Weight:      Height:        Intake/Output Summary (Last 24 hours) at 05/11/2018 1232 Last data filed at 05/10/2018 1700 Gross per 24 hour  Intake 17.68 ml  Output -  Net 17.68 ml   Physical Exam: A&Ox3, NAD CV: RRR Pulmonary: CTA Bilaterally Abdomen: Soft, Nontender, Nondistended Vascular:  Right lower extremity: Stump healed. All staples / sutures removed.   Left: Dressing removed. Wound bed healthy with granulation tissue. Minimal bloody drainage. No purulent discharge. No foul odor. New wet to dry placed in preparation for wound care to place VAC.    Laboratory: CBC    Component Value Date/Time   WBC 11.1 (H) 05/11/2018 1014   HGB 11.2 (L) 05/11/2018 1014   HCT 38.5 05/11/2018 1014   PLT 299 05/11/2018 1014   BMET    Component Value Date/Time   NA 137 05/11/2018 1014   K 4.4 05/11/2018 1014   CL 96 (L) 05/11/2018 1014   CO2 29 05/11/2018 1014   GLUCOSE 224 (H) 05/11/2018 1014   BUN 40 (H) 05/11/2018 1014   CREATININE 6.51 (H) 05/11/2018 1014   CALCIUM 11.0 (H) 05/11/2018 1014   GFRNONAA 6 (L) 05/11/2018 1014   GFRAA 7 (L) 05/11/2018 1014   Assessment/Planning: 68 year old female with past medical history of PAD s/p bilateral BKA with dehiscence of left BKA s/p wound  debridement on 05/08/18. 1) Patient is no longer refusing VAC placement. Will place consult to wound care to place [facility]. 2) Once VAC is placed and patient is medically stable she can be discharged.  3) Patient to follow up in our office in one week for wound check. 4) VAC settings: 125, continuous. Cleaned gently with normal saline. Changed M-W-F. 5) Completed discharge wound instructions and follow up information.  Discussed with Dr. Ellis Parents Koal Eslinger PA-C 05/11/2018 12:32 PM

## 2018-05-11 NOTE — Plan of Care (Signed)
RN assessment revealed AMS - a change from 1/18 (last time I cared for this patient).  Patient unable to self set-up food tray, Asking strange questions and not retaining information. Dr. Informed, labs ordered.

## 2018-05-11 NOTE — Progress Notes (Signed)
Occupational Therapy Treatment Patient Details Name: Stephanie Ellis MRN: [id number] DOB: 1950-12-19 Today's Date: 05/11/2018    History of present illness 68 y/o F who presented from [facility] (previously from [facility]) with infection to L BKA. PMH includes CABG x3, MI, CHF, CAD, HTN, HLD, PVD, hx siezures, ESRD on HD T/R/Sat, and R BKA.   OT comments  Pt seen on this date for OT treatment focused on LBD to increase independence with ADL. Pt requiring supervision for safety at bed level for LBD. Pt reporting no pain. Pt progressing towards goals and continues to benefit from OT services.   Follow Up Recommendations  SNF    Equipment Recommendations  Other (comment)(TBD)    Recommendations for Other Services      Precautions / Restrictions Precautions Precautions: Fall Precaution Comments: NWB on BLE's Restrictions Weight Bearing Restrictions: Yes RLE Weight Bearing: Non weight bearing LLE Weight Bearing: Non weight bearing Other Position/Activity Restrictions: NWBing bilaterally 2/2 bilat BKA       Mobility Bed Mobility Overal bed mobility: Needs Assistance Bed Mobility: Supine to Sit;Sit to Supine     Supine to sit: Supervision Sit to supine: Supervision   General bed mobility comments: Patient requiring supervision for safety.  Transfers                      Balance Overall balance assessment: Needs assistance Sitting-balance support: No upper extremity supported Sitting balance-Leahy Scale: Good Sitting balance - Comments: steady with no LOB's                                   ADL either performed or assessed with clinical judgement   ADL Overall ADL's : Needs assistance/impaired                     Lower Body Dressing: Supervision/safety;Set up;Sitting/lateral leans Lower Body Dressing Details (indicate cue type and reason): Patient able to don  pants while sitting EOB with supervision for safety, patient  manipulated pants over hips lying supine while log rolling. 1 vc for extending L knee to improve donning.                     Vision Patient Visual Report: No change from baseline     Perception     Praxis      Cognition Arousal/Alertness: Awake/alert Behavior During Therapy: WFL for tasks assessed/performed Overall Cognitive Status: Within Functional Limits for tasks assessed                                 General Comments: Patient A/O to day, place, and self. Patient slightly confused but able to follow comands well.        Exercises     Shoulder Instructions       General Comments      Pertinent Vitals/ Pain       Pain Assessment: No/denies pain  Home Living                                          Prior Functioning/Environment              Frequency  Min 2X/week        Progress  Toward Goals  OT Goals(current goals can now be found in the care plan section)  Progress towards OT goals: Progressing toward goals  Acute Rehab OT Goals Patient Stated Goal: pt did not state. OT Goal Formulation: With patient Time For Goal Achievement: 05/19/18  Plan Discharge plan remains appropriate;Frequency remains appropriate    Co-evaluation                 AM-PAC OT "6 Clicks" Daily Activity     Outcome Measure   Help from another person eating meals?: A Little Help from another person taking care of personal grooming?: A Little Help from another person toileting, which includes using toliet, bedpan, or urinal?: A Lot Help from another person bathing (including washing, rinsing, drying)?: A Lot Help from another person to put on and taking off regular upper body clothing?: A Little Help from another person to put on and taking off regular lower body clothing?: A Little 6 Click Score: 16    End of Session    OT Visit Diagnosis: Other abnormalities of gait and mobility (R26.89);Muscle weakness (generalized)  (M62.81);Other symptoms and signs involving cognitive function   Activity Tolerance Patient tolerated treatment well   Patient Left in bed;with bed alarm set;Other (comment)(Dr in room, pt sitting EOB, RN notified)   Nurse Communication Other (comment)        Time: 7215-8727 OT Time Calculation (min): 20 min  Charges: OT General Charges $OT Visit: 1 Visit OT Treatments $Self Care/Home Management : 8-22 mins  Vickki Hearing COTA/L 05/11/18, 11:10 AM

## 2018-05-11 NOTE — Discharge Instructions (Signed)
Vascular Surgery Wound Care Instructions 1) Clean wound gently with normal saline. Pat dry. 2) VAC settings: 125, continuous, black foam.  3) Change Mon-Wed-Fri.

## 2018-05-11 NOTE — Progress Notes (Signed)
[place] [facility] [place] NAME: Stephanie Ellis    MR#:  [id number]  DATE OF BIRTH:  June 16, 1950  SUBJECTIVE:   Patient is a bit confused this morning.  No other acute events overnight.  Afebrile, hemodynamically stable.  Wound cultures are still growing Pseudomonas.  REVIEW OF SYSTEMS:    Review of Systems  Constitutional: Negative for chills and fever.  HENT: Negative for congestion and tinnitus.   Eyes: Negative for blurred vision and double vision.  Respiratory: Negative for cough, shortness of breath and wheezing.   Cardiovascular: Negative for chest pain, orthopnea and PND.  Gastrointestinal: Negative for abdominal pain, diarrhea, nausea and vomiting.  Genitourinary: Negative for dysuria and hematuria.  Neurological: Negative for dizziness, sensory change and focal weakness.  All other systems reviewed and are negative.   Nutrition: Rneal/Carb Tolerating Diet: Yes  DRUG ALLERGIES:   Allergies  Allergen Reactions  . Chlorthalidone Anaphylaxis, Itching and Rash  . Fentanyl Rash  . Midazolam Rash  . Ace Inhibitors Other (See Comments)    Reaction:  Hyperkalemia, agitation   . Angiotensin Receptor Blockers Other (See Comments)    Reaction:  Hyperkalemia, agitation   . Norvasc [Amlodipine] Itching and Rash  . Phenergan [Promethazine Hcl] Anxiety    "antsy, can't sit still"    VITALS:  Blood pressure (!) 170/58, pulse 71, temperature 98.1 F (36.7 C), temperature source Oral, resp. rate 18, height 5' 4"  (1.626 m), weight 45.4 kg, SpO2 99 %.  PHYSICAL EXAMINATION:   Physical Exam  GENERAL:  68 y.o.-year-old patient lying getting HD in NAD.  EYES: Pupils equal, round, reactive to light and accommodation. No scleral icterus. Extraocular muscles intact.  HEENT: Head atraumatic, normocephalic. Oropharynx and nasopharynx clear.  NECK:  Supple, no jugular venous distention. No thyroid enlargement, no tenderness.  LUNGS: Normal  breath sounds bilaterally, no wheezing, rales, rhonchi. No use of accessory muscles of respiration.  CARDIOVASCULAR: S1, S2 normal. No murmurs, rubs, or gallops.  ABDOMEN: Soft, nontender, nondistended. Bowel sounds present. No organomegaly or mass.  EXTREMITIES: No cyanosis, clubbing or edema b/l.  B/l BKA with left stump dressing in place from recent debridement with no acute drainage noted.  05/11/18   05/05/18      NEUROLOGIC: Cranial nerves II through XII are intact. No focal Motor or sensory deficits b/l.   PSYCHIATRIC: The patient is alert and oriented x 3.  SKIN: No obvious rash, lesion, or ulcer.    LABORATORY PANEL:   CBC Recent Labs  Lab 05/11/18 1014  WBC 11.1*  HGB 11.2*  HCT 38.5  PLT 299   ------------------------------------------------------------------------------------------------------------------  Chemistries  Recent Labs  Lab 05/08/18 0410 05/11/18 1014  NA 139 137  K 3.4* 4.4  CL 98 96*  CO2 32 29  GLUCOSE 153* 224*  BUN 16 40*  CREATININE 4.16* 6.51*  CALCIUM 9.8 11.0*  MG 1.9  --    ------------------------------------------------------------------------------------------------------------------  Cardiac Enzymes No results for input(s): TROPONINI in the last 168 hours. ------------------------------------------------------------------------------------------------------------------  RADIOLOGY:  No results found.   ASSESSMENT AND PLAN:   68 year old female with past medical history of end-stage renal disease on hemodialysis, peripheral vascular disease status post bilateral BKA, recent infection of the left stump, glaucoma, hypertension, hyperlipidemia, diabetes, history of seizures who presented to the hospital due to foul-smelling drainage and suspected infection of the left BKA stump.  1.  Infection of the left BKA stump- MRI of the left leg showing some osteitis  but less likely osteomyelitis. -Continue IV Zosyn. -Status post  irrigation and debridement of the left BKA stump POD # 3 today.  Had initially refused a wound VAC but now is in agreement to it.  Patient to have wound VAC placed today.  Appreciate infectious disease input and will start the patient on 10 days of oral ciprofloxacin and Flagyl. -Social work aware and will arrange wound VAC at the skilled nursing facility.  Intraoperative wound cultures are still growing Pseudomonas. -Continue local wound care as per wound care nurse.  2.  End-stage renal disease on hemodialysis-patient gets dialysis on Tuesday Thursday and Saturday. - nephrology following and cont. Dialysis as per them.   3.  Essential hypertension-continue carvedilol, Avapro, Imdur.  - BP stable.    4.  Restless leg syndrome-continue Requip.  5.  Secondary hyperparathyroidism-continue Renvela.  6.  Hx of seizures-no acute seizure type activity.  Continue Keppra.  7.  Chronic hypotension- continue midodrine with dialysis  8.  Hypokalemia- improved w/ HD.    9.  Glaucoma- continue timolol, alphagan eyedrops.  Possible discharge to skilled nursing facility in next 24 to 48 hours once wound VAC at the facility has been arranged.    All the records are reviewed and case discussed with Care Management/Social Worker. Management plans discussed with the patient, family and they are in agreement.  CODE STATUS: Full code  DVT Prophylaxis: Eliquis  TOTAL TIME TAKING CARE OF THIS PATIENT: 30 minutes.   POSSIBLE D/C IN 1-2 DAYS, DEPENDING ON CLINICAL CONDITION.   Henreitta Leber M.D on 05/11/2018 at 12:47 PM  Between 7am to 6pm - Pager - [phone]  After 6pm go to [email or web address] - Proofreader  [facility]  Office  [phone]  CC: Primary care physician; Hanley Ben, NP

## 2018-05-11 NOTE — Consult Note (Signed)
[facility] Nurse wound follow up I spoke with the patient's primary RN, Rodman Key and updated him that the [facility] dressing had been placed to the LLE wound.  During our conversation he stated that the patient would be placed on a Prevena VAC prior to discharge.  I explained that the Prevena is only for incision therapy (meaning a closed incision) and must be placed in the OR.  Bedside staff should not be placing a Prevena pump and discharging a patient that has routine VAC therapy.  He stated his understanding, and that he would clamp it off when it is time for the patient to be discharged, so the receiving facility can place their [facility] device upon her arrival. Val Riles, RN, MSN, CWOCN, CNS-BC, pager [phone]

## 2018-05-11 NOTE — Progress Notes (Signed)
[facility]  ROUNDING NOTE   Subjective:   Patient states her pain is well controlled.   Objective:  Vital signs in last 24 hours:  Temp:  [98.1 F (36.7 C)] 98.1 F (36.7 C) (01/20 0700) Pulse Rate:  [64-77] 71 (01/20 0700) Resp:  [18] 18 (01/19 2315) BP: (155-173)/(58-60) 170/58 (01/20 0700) SpO2:  [98 %-99 %] 99 % (01/20 0700)  Weight change:  Filed Weights   05/07/18 1017 05/07/18 1421 05/09/18 1030  Weight: 47 kg 45.9 kg 45.4 kg    Intake/Output: I/O last 3 completed shifts: In: 310 [I.V.:70.7; IV Piggyback:239.4] Out: -    Intake/Output this shift:  No intake/output data recorded.  Physical Exam: General: NAD,   Head: Normocephalic, atraumatic. Moist oral mucosal membranes  Eyes: Anicteric, PERRL  Neck: Supple, trachea midline  Lungs:  Clear to auscultation  Heart: Regular rate and rhythm  Abdomen:  Soft, nontender,   Extremities:  bilateral BKA. Left with clean dressings  Neurologic: Nonfocal, moving all four extremities  Skin: No lesions  Access: RIJ permcath    Basic Metabolic Panel: Recent Labs  Lab 05/05/18 0415 05/05/18 1406 05/08/18 0410 05/09/18 1050 05/11/18 1014  NA 140  --  139  --  137  K 3.7  --  3.4*  --  4.4  CL 102  --  98  --  96*  CO2 27  --  32  --  29  GLUCOSE 165*  --  153*  --  224*  BUN 35*  --  16  --  40*  CREATININE 6.76*  --  4.16*  --  6.51*  CALCIUM 10.4*  --  9.8  --  11.0*  MG  --   --  1.9  --   --   PHOS  --  4.7*  --  4.2  --     Liver Function Tests: No results for input(s): AST, ALT, ALKPHOS, BILITOT, PROT, ALBUMIN in the last 168 hours. No results for input(s): LIPASE, AMYLASE in the last 168 hours. No results for input(s): AMMONIA in the last 168 hours.  CBC: Recent Labs  Lab 05/07/18 0519 05/08/18 0410 05/11/18 1014  WBC 8.0 8.4 11.1*  HGB 11.3* 11.9* 11.2*  HCT 39.7 41.0 38.5  MCV 98.5 94.7 95.8  PLT 231 273 299    Cardiac Enzymes: No results for input(s): CKTOTAL, CKMB,  CKMBINDEX, TROPONINI in the last 168 hours.  BNP: Invalid input(s): POCBNP  CBG: Recent Labs  Lab 05/10/18 1223 05/10/18 1644 05/10/18 2107 05/11/18 0739 05/11/18 1153  GLUCAP 218* 108* 159* 147* 240*    Microbiology: Results for orders placed or performed during the hospital encounter of 05/02/18  Aerobic/Anaerobic Culture (surgical/deep wound)     Status: None (Preliminary result)   Collection Time: 05/02/18  6:20 PM  Result Value Ref Range Status   Specimen Description   Final    WOUND Performed at [facility], [address], [place], [place] [address]    Special Requests   Final    Normal Performed at [facility], [address] [place], [place] [address]    Gram Stain   Final    FEW WBC PRESENT, PREDOMINANTLY PMN ABUNDANT GRAM VARIABLE ROD    Culture   Final    MODERATE PSEUDOMONAS AERUGINOSA MODERATE VANCOMYCIN RESISTANT ENTEROCOCCUS NO ANAEROBES ISOLATED Sent to [place] for further susceptibility testing. Performed at [facility], [place] [address], [place], [place] [address]    Report Status PENDING  Incomplete   Organism ID, Bacteria PSEUDOMONAS AERUGINOSA  Final   Organism ID, Bacteria VANCOMYCIN RESISTANT ENTEROCOCCUS  Final      Susceptibility   Pseudomonas aeruginosa - MIC*    CEFTAZIDIME 16 INTERMEDIATE Intermediate     CIPROFLOXACIN <=0.25 SENSITIVE Sensitive     GENTAMICIN <=1 SENSITIVE Sensitive     IMIPENEM 1 SENSITIVE Sensitive     CEFEPIME INTERMEDIATE Intermediate     * MODERATE PSEUDOMONAS AERUGINOSA   Vancomycin resistant enterococcus - MIC*    AMPICILLIN >=32 RESISTANT Resistant     VANCOMYCIN >=32 RESISTANT Resistant     GENTAMICIN SYNERGY SENSITIVE Sensitive     LINEZOLID 2 SENSITIVE Sensitive     * MODERATE VANCOMYCIN RESISTANT ENTEROCOCCUS  Blood culture (routine x 2)     Status: None   Collection Time: 05/02/18  6:20 PM  Result Value Ref Range Status   Specimen Description BLOOD LFA  Final   Special  Requests   Final    BOTTLES DRAWN AEROBIC AND ANAEROBIC Blood Culture adequate volume   Culture   Final    NO GROWTH 5 DAYS Performed at [facility], [address] [place], [place] [address]    Report Status 05/07/2018 FINAL  Final  Susceptibility, Aer + Anaerob     Status: None   Collection Time: 05/02/18  6:20 PM  Result Value Ref Range Status   Suscept, Aer + Anaerob Preliminary report  Final    Comment: (NOTE) Performed At: [facility] [place], [place] [id number] Rush Farmer MD WC:[id number]    Source of Sample WOUND  Final    Comment: Performed at [facility], [place] [address] [place], [place] [address]  Susceptibility Result     Status: Abnormal   Collection Time: 05/02/18  6:20 PM  Result Value Ref Range Status   Suscept Result 1 Comment (A)  Final    Comment: (NOTE) Pseudomonas aeruginosa Performed At: [facility] [place], [place] [id number] Rush Farmer MD QP:[id number]   Blood culture (routine x 2)     Status: None   Collection Time: 05/02/18  6:24 PM  Result Value Ref Range Status   Specimen Description BLOOD LWRIST  Final   Special Requests   Final    BOTTLES DRAWN AEROBIC AND ANAEROBIC Blood Culture adequate volume   Culture   Final    NO GROWTH 5 DAYS Performed at [facility], [address] [place], [place] [address]    Report Status 05/07/2018 FINAL  Final  MRSA PCR Screening     Status: None   Collection Time: 05/03/18  1:04 AM  Result Value Ref Range Status   MRSA by PCR NEGATIVE NEGATIVE Final    Comment:        The GeneXpert MRSA Assay (FDA approved for NASAL specimens only), is one component of a comprehensive MRSA colonization surveillance program. It is not intended to diagnose MRSA infection nor to guide or monitor treatment for MRSA infections. Performed at [facility], [address] [place], [place] [address]   Aerobic/Anaerobic Culture  (surgical/deep wound)     Status: None (Preliminary result)   Collection Time: 05/08/18  1:55 PM  Result Value Ref Range Status   Specimen Description   Final    WOUND Performed at [facility], [address] [place], [place] [address]    Special Requests   Final    NONE Performed at [facility], [address] [place], [place] [address]    Gram Stain   Final    RARE WBC PRESENT, PREDOMINANTLY PMN RARE GRAM POSITIVE COCCI RARE GRAM POSITIVE RODS RARE GRAM VARIABLE ROD    Culture   Final    MODERATE PSEUDOMONAS AERUGINOSA NO ANAEROBES ISOLATED; CULTURE IN PROGRESS FOR 5 DAYS    Report Status PENDING  Incomplete   Organism ID, Bacteria PSEUDOMONAS AERUGINOSA  Final      Susceptibility   Pseudomonas aeruginosa - MIC*    CEFTAZIDIME >=64 RESISTANT Resistant     CIPROFLOXACIN <=0.25 SENSITIVE Sensitive     GENTAMICIN <=1 SENSITIVE Sensitive     IMIPENEM <=0.25 SENSITIVE Sensitive     CEFEPIME 32 RESISTANT Resistant     * MODERATE PSEUDOMONAS AERUGINOSA    Coagulation Studies: No results for input(s): LABPROT, INR in the last 72 hours.  Urinalysis: No results for input(s): COLORURINE, LABSPEC, PHURINE, GLUCOSEU, HGBUR, BILIRUBINUR, KETONESUR, PROTEINUR, UROBILINOGEN, NITRITE, LEUKOCYTESUR in the last 72 hours.  Invalid input(s): APPERANCEUR    Imaging: No results found.   Medications:   . sodium chloride Stopped (05/10/18 1447)   . apixaban  2.5 mg Oral BID  . aspirin EC  81 mg Oral Daily  . atorvastatin  80 mg Oral QHS  . brimonidine  1 drop Both Eyes TID  . calcitRIOL  0.5 mcg Oral Daily  . carvedilol  6.25 mg Oral BID WC  . ciprofloxacin  500 mg Oral Q24H  . docusate sodium  100 mg Oral BID  . famotidine  20 mg Oral Daily  . feeding supplement (NEPRO CARB STEADY)  237 mL Oral BID BM  . folic acid  1 mg Oral Daily  . insulin aspart  0-9 Units Subcutaneous TID WC  . irbesartan  75 mg Oral Daily  . isosorbide mononitrate  30 mg Oral  Daily  . levETIRAcetam  250 mg Oral Once per day on Tue Thu Sat  . levETIRAcetam  500 mg Oral BID  . metroNIDAZOLE  500 mg Oral Q12H  . midodrine  10 mg Oral Q T,Th,Sa-HD  . mirtazapine  7.5 mg Oral QHS  . multivitamin  1 tablet Oral QHS  . multivitamin-lutein  1 capsule Oral Daily  . omega-3 acid ethyl esters  2 capsule Oral Daily  . polyvinyl alcohol  1 drop Both Eyes BID  . rOPINIRole  0.25 mg Oral QHS  . sevelamer carbonate  800 mg Oral TID WC  . sodium chloride flush  3 mL Intravenous Q12H  . timolol  1 drop Both Eyes TID  . vitamin B-12  1,000 mcg Oral Daily  . vitamin C  500 mg Oral BID   sodium chloride, acetaminophen, hydrALAZINE, nitroGLYCERIN, ondansetron **OR** ondansetron (ZOFRAN) IV, sodium chloride flush, traMADol  Assessment/ Plan:  [name] is a 68 y.o. 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 End-stage renal disease TTS schedule. Dialysis for tomorrow.   2. Anemia of chronic kidney disease: hemoglobin 11.2 - Hold epo  3. Secondary hyperparathyroidism - Continue sevelamer - calcitriol  4. Peripheral vascular disease: status post left BKA. Wound infection with pseudomonas - Appreciate ID input: cipro 565m daily and metronidazole 5070mbid for 10 days.   5. Hypertension: blood pressure at goal.  - irbesartan,  isosorbide mononitrate and carvedilol - midodrine before dialysis due to hypotensive episodes.    LOS: [place] 1/20/202012:53 PM

## 2018-05-11 NOTE — Progress Notes (Signed)
Pharmacy Antibiotic Note  Stephanie Ellis is a 69 y.o. female admitted on 05/02/2018 with osteo/wound infection.  Pharmacy has been consulted for Zosyn and vancomycin dosing.  Plan: Continue Zosyn 3.375 g IV Q12H EI    Height: 5' 4"  (162.6 cm) Weight: 100 lb 1.4 oz (45.4 kg) IBW/kg (Calculated) : 54.7  Temp (24hrs), Avg:98.1 F (36.7 C), Min:98.1 F (36.7 C), Max:98.1 F (36.7 C)  Recent Labs  Lab 05/05/18 0415 05/07/18 0519 05/08/18 0410 05/09/18 0554 05/11/18 1014  WBC  --  8.0 8.4  --  11.1*  CREATININE 6.76*  --  4.16*  --  6.51*  VANCORANDOM  --   --   --  20  --     Estimated Creatinine Clearance: 6 mL/min (A) (by C-G formula based on SCr of 6.51 mg/dL (H)).    Allergies  Allergen Reactions  . Chlorthalidone Anaphylaxis, Itching and Rash  . Fentanyl Rash  . Midazolam Rash  . Ace Inhibitors Other (See Comments)    Reaction:  Hyperkalemia, agitation   . Angiotensin Receptor Blockers Other (See Comments)    Reaction:  Hyperkalemia, agitation   . Norvasc [Amlodipine] Itching and Rash  . Phenergan [Promethazine Hcl] Anxiety    "antsy, can't sit still"    Antimicrobials this admission: 1/11 Vancomycin >> 1/17 1/12 Zosyn >>  Dose adjustments this admission:   Microbiology results:  BCx: NGTD  1/17WCx: PsA  MRSA PCR: neg  Thank you for allowing pharmacy to be a part of this patient's care.  Rocky Morel, PharmD, BCPS Clinical Pharmacist 05/11/2018 11:13 AM

## 2018-05-11 NOTE — Progress Notes (Signed)
Plan is for patient to D/C to [facility] tomorrow after dialysis per MD. Per Magda Paganini admissions coordinator at [facility] they have the wound vac for patient. Clinical Social Worker (CSW) contacted patient's daughter [name] and made her aware of above.   [employer], LCSW [phone]

## 2018-05-12 LAB — GLUCOSE, CAPILLARY
Glucose-Capillary: 138 mg/dL — ABNORMAL HIGH (ref 70–99)
Glucose-Capillary: 172 mg/dL — ABNORMAL HIGH (ref 70–99)
Glucose-Capillary: 80 mg/dL (ref 70–99)

## 2018-05-12 LAB — SUSCEPTIBILITY, AER + ANAEROB

## 2018-05-12 LAB — SUSCEPTIBILITY RESULT

## 2018-05-12 MED ORDER — NEPRO/CARBSTEADY PO LIQD: 237.0000 mL | Freq: Two times a day (BID) | ORAL | 0 refills | Status: DC

## 2018-05-12 MED ORDER — ROPINIROLE HCL 0.25 MG PO TABS: 0.2500 mg | ORAL_TABLET | Freq: Every day | ORAL | Status: DC

## 2018-05-12 MED ORDER — RENA-VITE PO TABS: 1.0000 | ORAL_TABLET | Freq: Every day | ORAL | 0 refills | Status: DC

## 2018-05-12 MED ORDER — METRONIDAZOLE 500 MG PO TABS: 500.0000 mg | ORAL_TABLET | Freq: Two times a day (BID) | ORAL | 0 refills | Status: DC

## 2018-05-12 MED ORDER — CIPROFLOXACIN HCL 500 MG PO TABS: 500.0000 mg | ORAL_TABLET | Freq: Every day | ORAL | 0 refills | Status: DC

## 2018-05-12 MED ORDER — LEVETIRACETAM 250 MG PO TABS: ORAL_TABLET | ORAL | Status: AC

## 2018-05-12 MED ORDER — ASCORBIC ACID 500 MG PO TABS: 500.0000 mg | ORAL_TABLET | Freq: Two times a day (BID) | ORAL | 0 refills | Status: DC

## 2018-05-12 MED ORDER — TRAMADOL HCL 50 MG PO TABS: 50.0000 mg | ORAL_TABLET | Freq: Three times a day (TID) | ORAL | 0 refills | Status: DC | PRN

## 2018-05-12 NOTE — Progress Notes (Signed)
Tolerated treatment well without issue. UF goal 1L met. Patient currently has no complaints. Denies pain. Report called to primary RN.

## 2018-05-12 NOTE — Progress Notes (Signed)
Patient is medically stable for D/C back to [facility] today after dialysis. Per Tiffany admissions coordinator at [facility] patient can return today to room 306-A. Per [employer] has a wound vac for patient. RN will call report and arrange EMS for transport. Clinical Education officer, museum (CSW) sent D/C orders to [facility] via [place]. CSW contacted patient's daughter [name] and made her aware of above. Patient is aware of above. [facility] dialysis coordinator is aware of above. Please reconsult if future social work needs arise. CSW signing off.   [employer], LCSW [phone]

## 2018-05-12 NOTE — Progress Notes (Signed)
Called report to Shelby at [facility], will send once finished Dialysis.

## 2018-05-12 NOTE — Progress Notes (Signed)
Called EMS for transport. 

## 2018-05-12 NOTE — Progress Notes (Signed)
[facility]  ROUNDING NOTE   Subjective:   Seen and examined on hemodialysis treatment. No complains.  UF 1 liters  Objective:  Vital signs in last 24 hours:  Temp:  [97.4 F (36.3 C)-97.8 F (36.6 C)] 97.4 F (36.3 C) (01/21 0753) Pulse Rate:  [60-71] 71 (01/21 0753) Resp:  [19] 19 (01/20 2348) BP: (120-202)/(53-78) 156/63 (01/21 0753) SpO2:  [96 %-100 %] 100 % (01/21 0753)  Weight change:  Filed Weights   05/07/18 1017 05/07/18 1421 05/09/18 1030  Weight: 47 kg 45.9 kg 45.4 kg    Intake/Output: I/O last 3 completed shifts: In: 3 [I.V.:3] Out: 1 [Urine:1]   Intake/Output this shift:  No intake/output data recorded.  Physical Exam: General: NAD,   Head: Normocephalic, atraumatic. Moist oral mucosal membranes  Eyes: Anicteric, PERRL  Neck: Supple, trachea midline  Lungs:  Clear to auscultation  Heart: Regular rate and rhythm  Abdomen:  Soft, nontender,   Extremities:  bilateral BKA. Left with clean dressings, left wound vac  Neurologic: Nonfocal, moving all four extremities  Skin: No lesions  Access: RIJ permcath    Basic Metabolic Panel: Recent Labs  Lab 05/05/18 1406 05/08/18 0410 05/09/18 1050 05/11/18 1014  NA  --  139  --  137  K  --  3.4*  --  4.4  CL  --  98  --  96*  CO2  --  32  --  29  GLUCOSE  --  153*  --  224*  BUN  --  16  --  40*  CREATININE  --  4.16*  --  6.51*  CALCIUM  --  9.8  --  11.0*  MG  --  1.9  --   --   PHOS 4.7*  --  4.2  --     Liver Function Tests: No results for input(s): AST, ALT, ALKPHOS, BILITOT, PROT, ALBUMIN in the last 168 hours. No results for input(s): LIPASE, AMYLASE in the last 168 hours. No results for input(s): AMMONIA in the last 168 hours.  CBC: Recent Labs  Lab 05/07/18 0519 05/08/18 0410 05/11/18 1014  WBC 8.0 8.4 11.1*  HGB 11.3* 11.9* 11.2*  HCT 39.7 41.0 38.5  MCV 98.5 94.7 95.8  PLT 231 273 299    Cardiac Enzymes: No results for input(s): CKTOTAL, CKMB, CKMBINDEX,  TROPONINI in the last 168 hours.  BNP: Invalid input(s): POCBNP  CBG: Recent Labs  Lab 05/11/18 1153 05/11/18 1651 05/11/18 2104 05/12/18 0755 05/12/18 1148  GLUCAP 240* 150* 127* 138* 172*    Microbiology: Results for orders placed or performed during the hospital encounter of 05/02/18  Aerobic/Anaerobic Culture (surgical/deep wound)     Status: None (Preliminary result)   Collection Time: 05/02/18  6:20 PM  Result Value Ref Range Status   Specimen Description   Final    WOUND Performed at [facility], [address] [place], [place] [address]    Special Requests   Final    Normal Performed at [facility], [address] [place], [place] [address]    Gram Stain   Final    FEW WBC PRESENT, PREDOMINANTLY PMN ABUNDANT GRAM VARIABLE ROD    Culture   Final    MODERATE PSEUDOMONAS AERUGINOSA MODERATE VANCOMYCIN RESISTANT ENTEROCOCCUS NO ANAEROBES ISOLATED Sent to [place] for further susceptibility testing. Performed at [facility], [place] [address] [place], [place] [address]    Report Status PENDING  Incomplete   Organism ID, Bacteria PSEUDOMONAS  AERUGINOSA  Final   Organism ID, Bacteria VANCOMYCIN RESISTANT ENTEROCOCCUS  Final      Susceptibility   Pseudomonas aeruginosa - MIC*    CEFTAZIDIME 16 INTERMEDIATE Intermediate     CIPROFLOXACIN <=0.25 SENSITIVE Sensitive     GENTAMICIN <=1 SENSITIVE Sensitive     IMIPENEM 1 SENSITIVE Sensitive     CEFEPIME INTERMEDIATE Intermediate     * MODERATE PSEUDOMONAS AERUGINOSA   Vancomycin resistant enterococcus - MIC*    AMPICILLIN >=32 RESISTANT Resistant     VANCOMYCIN >=32 RESISTANT Resistant     GENTAMICIN SYNERGY SENSITIVE Sensitive     LINEZOLID 2 SENSITIVE Sensitive     * MODERATE VANCOMYCIN RESISTANT ENTEROCOCCUS  Blood culture (routine x 2)     Status: None   Collection Time: 05/02/18  6:20 PM  Result Value Ref Range Status   Specimen Description BLOOD LFA  Final   Special Requests    Final    BOTTLES DRAWN AEROBIC AND ANAEROBIC Blood Culture adequate volume   Culture   Final    NO GROWTH 5 DAYS Performed at [facility], [address] [place], [place] [address]    Report Status 05/07/2018 FINAL  Final  Susceptibility, Aer + Anaerob     Status: None   Collection Time: 05/02/18  6:20 PM  Result Value Ref Range Status   Suscept, Aer + Anaerob REFERT  Corrected    Comment: (NOTE) Please refer to the following specimen for additional lab results. Please refer to spec# [phone] for test result. Test order verified by by Shaune Leeks 05-12-2018 08:45 Performed At: [facility] [place], [place] [id number] Rush Farmer MD [id number] CORRECTED ON 01/21 AT 0936: PREVIOUSLY REPORTED AS Preliminary report    Source of Sample WOUND  Final    Comment: Performed at [facility], [place] [address] [place], [place] [address]  Susceptibility Result     Status: Abnormal   Collection Time: 05/02/18  6:20 PM  Result Value Ref Range Status   Suscept Result 1 Comment (A)  Final    Comment: (NOTE) Pseudomonas aeruginosa Request Problem Request Problem Please refer to the following specimen for additional lab results.      TEST:  [id number]  Susceptibility, Aer + Anaerob Please refer to spec# [phone] for test result. Test order verified by by Shaune Leeks 05-12-2018 08:45 Performed At: [facility] [place], [place] [id number] Rush Farmer MD [id number]   Blood culture (routine x 2)     Status: None   Collection Time: 05/02/18  6:24 PM  Result Value Ref Range Status   Specimen Description BLOOD LWRIST  Final   Special Requests   Final    BOTTLES DRAWN AEROBIC AND ANAEROBIC Blood Culture adequate volume   Culture   Final    NO GROWTH 5 DAYS Performed at [facility], [address] [place], [place] [address]    Report Status 05/07/2018 FINAL  Final  MRSA PCR Screening     Status: None    Collection Time: 05/03/18  1:04 AM  Result Value Ref Range Status   MRSA by PCR NEGATIVE NEGATIVE Final    Comment:        The GeneXpert MRSA Assay (FDA approved for NASAL specimens only), is one component of a comprehensive MRSA colonization surveillance program. It is not intended to diagnose MRSA infection nor to guide or monitor treatment for MRSA infections. Performed at [facility], [address] [place],  [place] [address]   Aerobic/Anaerobic Culture (surgical/deep wound)     Status: None (Preliminary result)   Collection Time: 05/08/18  1:55 PM  Result Value Ref Range Status   Specimen Description   Final    WOUND Performed at [facility], [address] [place], [place] [address]    Special Requests   Final    NONE Performed at [facility], [place], [place] [address]    Gram Stain   Final    RARE WBC PRESENT, PREDOMINANTLY PMN RARE GRAM POSITIVE COCCI RARE GRAM POSITIVE RODS RARE GRAM VARIABLE ROD    Culture   Final    MODERATE PSEUDOMONAS AERUGINOSA NO ANAEROBES ISOLATED; CULTURE IN PROGRESS FOR 5 DAYS    Report Status PENDING  Incomplete   Organism ID, Bacteria PSEUDOMONAS AERUGINOSA  Final      Susceptibility   Pseudomonas aeruginosa - MIC*    CEFTAZIDIME >=64 RESISTANT Resistant     CIPROFLOXACIN <=0.25 SENSITIVE Sensitive     GENTAMICIN <=1 SENSITIVE Sensitive     IMIPENEM <=0.25 SENSITIVE Sensitive     CEFEPIME 32 RESISTANT Resistant     * MODERATE PSEUDOMONAS AERUGINOSA    Coagulation Studies: No results for input(s): LABPROT, INR in the last 72 hours.  Urinalysis: No results for input(s): COLORURINE, LABSPEC, PHURINE, GLUCOSEU, HGBUR, BILIRUBINUR, KETONESUR, PROTEINUR, UROBILINOGEN, NITRITE, LEUKOCYTESUR in the last 72 hours.  Invalid input(s): APPERANCEUR    Imaging: No results found.   Medications:   . sodium chloride Stopped (05/10/18 1447)   . apixaban  2.5 mg Oral BID  . aspirin  EC  81 mg Oral Daily  . atorvastatin  80 mg Oral QHS  . brimonidine  1 drop Both Eyes TID  . calcitRIOL  0.5 mcg Oral Daily  . carvedilol  6.25 mg Oral BID WC  . ciprofloxacin  500 mg Oral Q24H  . docusate sodium  100 mg Oral BID  . famotidine  20 mg Oral Daily  . feeding supplement (NEPRO CARB STEADY)  237 mL Oral BID BM  . folic acid  1 mg Oral Daily  . insulin aspart  0-9 Units Subcutaneous TID WC  . irbesartan  75 mg Oral Daily  . isosorbide mononitrate  30 mg Oral Daily  . levETIRAcetam  250 mg Oral Once per day on Tue Thu Sat  . levETIRAcetam  500 mg Oral BID  . metroNIDAZOLE  500 mg Oral Q12H  . midodrine  10 mg Oral Q T,Th,Sa-HD  . mirtazapine  7.5 mg Oral QHS  . multivitamin  1 tablet Oral QHS  . multivitamin-lutein  1 capsule Oral Daily  . omega-3 acid ethyl esters  2 capsule Oral Daily  . polyvinyl alcohol  1 drop Both Eyes BID  . rOPINIRole  0.25 mg Oral QHS  . sevelamer carbonate  800 mg Oral TID WC  . sodium chloride flush  3 mL Intravenous Q12H  . timolol  1 drop Both Eyes TID  . vitamin B-12  1,000 mcg Oral Daily  . vitamin C  500 mg Oral BID   sodium chloride, acetaminophen, hydrALAZINE, nitroGLYCERIN, ondansetron **OR** ondansetron (ZOFRAN) IV, sodium chloride flush, traMADol  Assessment/ Plan:  [name] is a 68 y.o. 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 End-stage renal disease: seen and examined on hemodialysis treatment. Tolerating treatment well.  TTS schedule.    2. Anemia of chronic kidney disease: hemoglobin 11.2 - Hold epo  3. Secondary hyperparathyroidism - Continue sevelamer - calcitriol  4. Peripheral vascular disease:  status post left BKA. Wound infection with pseudomonas - Appreciate ID input: cipro 555m daily and metronidazole [id number] for 10 days.   5. Hypertension: blood pressure at goal.  - irbesartan, isosorbide mononitrate and carvedilol - midodrine before dialysis due to hypotensive episodes.    LOS: 10[place]/21/202012:54 PM

## 2018-05-12 NOTE — Progress Notes (Signed)
HD initiated via L Chest HD cath without issue. No heparin treatment. Patient currently without complaints. States she is going home today and is happy. Lungs clear. No notable edema. UF goal 1L as ordered. Continue to monitor.

## 2018-05-12 NOTE — Progress Notes (Signed)
Pre hd 

## 2018-05-12 NOTE — Discharge Summary (Signed)
[facility] NAME: Stephanie Ellis    MR#:  [id number]  DATE OF BIRTH:  Dec 10, 1950  DATE OF ADMISSION:  05/02/2018 ADMITTING PHYSICIAN: Dustin Flock, MD  DATE OF DISCHARGE: 05/12/2018  PRIMARY CARE PHYSICIAN: Hanley Ben, NP    ADMISSION DIAGNOSIS:  Wound infection [T14.8XXA, L08.9] Stump pain ([place]) [T87.89, M79.609]  DISCHARGE DIAGNOSIS:  Active Problems:   Infection of amputation stump ([place])   SECONDARY DIAGNOSIS:   Past Medical History:  Diagnosis Date  . (HFpEF) heart failure with preserved ejection fraction ([place])    a. 01/2018 Echo: EF 50-55%, no rwma, Gr1 DD, triv AI, mild MR. Midly dil LA/RV, mod reduced RV fxn. Irreg thickening of TV w/ mobile echodensity that appears to arise from valve.  . Anemia   . Anorexia   . Bacteremia due to Pseudomonas   . Carotid arterial disease ([place])    a. 01/2017 Carotid U/S: RICA <17, LICA <61.  Marland Kitchen Complication of anesthesia    a. Pt reports h/o complication on 9 different occasions - ? hypotension/arrest.  . 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 01/2015 Cath [facility]): 70% LM, p-mLAD 50-60% (Resting FFR 0.75), mRCA 80-90%, ~40 Ost OM & D1-->CABG; b. 01/2016 Staged PCI of LCX x 2 and RCA.  Marland Kitchen ESRD (end stage renal disease) on dialysis ([place])    a. ESRD secondary to acute kidney failure s/p CABG-->PD  . Essential hypertension   . GERD (gastroesophageal reflux disease)   . Heart murmur   . Hypercholesterolemia   . Myocardial infarction ([place]) 2016  . PVD (peripheral vascular disease) ([place])    a. 01/30/2018 PV Angio: Sev Left Popliteal dzs s/p PTA w/ 20m lutonix DEB w/ mech aspiration of the L popliteal, L AT, and Left tibioperoneal trunck and peroneal artery.  . 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  [facility]: Dr. BMarland KitchenHaithcock: CABG x 3, LIMA to LAD, SVG to RCA, SVG to OM3, EVH  . Sinusitis 2019  . Type II diabetes mellitus with complication ([place]    CAD     HOSPITAL COURSE:   1.  Infection of the left BKA stump.  MRI of the left leg showing osteitis but less likely osteomyelitis.  The patient was on IV Zosyn while here.  Patient is status post irrigation and debridement.  Postop day 4.  Wound VAC placed and orders as per vascular surgery.  Accepting facility has a wound VAC in place.  Infectious disease recommends 10 days of oral Cipro and Flagyl.  Wound cultures growing Pseudomonas.  Patient needs to be watched closely as outpatient for wound healing.   2.  End-stage renal disease on hemodialysis.  Hemodialysis Tuesday Thursday and Saturday.  Patient will receive hemodialysis today prior to disposition. 3.  Essential hypertension continue Coreg, Diovan and Imdur 4.  Restless leg syndrome on Requip 5.  Secondary hyperparathyroidism on Renvela 6.  History of seizures on Keppra.  Extra dose after dialysis. 7.  Chronic hypotension continue midodrine with dialysis 8.  Hypokalemia improved with hemodialysis 9.  Glaucoma continue eyedrops  DISCHARGE CONDITIONS:   Fair  CONSULTS OBTAINED:  Treatment Team:  SMurlean Iba MD CMurray Hodgkins MD RTsosie Billing MD  DRUG ALLERGIES:   Allergies  Allergen Reactions  . Chlorthalidone Anaphylaxis, Itching and Rash  . Fentanyl Rash  . Midazolam Rash  . Ace Inhibitors Other (See Comments)    Reaction:  Hyperkalemia, agitation   . Angiotensin  Receptor Blockers Other (See Comments)    Reaction:  Hyperkalemia, agitation   . Norvasc [Amlodipine] Itching and Rash  . Phenergan [Promethazine Hcl] Anxiety    "antsy, can't sit still"    DISCHARGE MEDICATIONS:   Allergies as of 05/12/2018      Reactions   Chlorthalidone Anaphylaxis, Itching, Rash   Fentanyl Rash   Midazolam Rash   Ace Inhibitors Other (See Comments)   Reaction:  Hyperkalemia, agitation    Angiotensin Receptor Blockers Other (See Comments)   Reaction:  Hyperkalemia, agitation    Norvasc [amlodipine] Itching, Rash    Phenergan [promethazine Hcl] Anxiety   "antsy, can't sit still"      Medication List    STOP taking these medications   doxycycline 100 MG capsule Commonly known as:  VIBRAMYCIN   multivitamin with minerals Tabs tablet     TAKE these medications   acetaminophen 325 MG tablet Commonly known as:  TYLENOL Take 2 tablets (650 mg total) by mouth every 6 (six) hours as needed for mild pain (or Fever >/= 101).   apixaban 2.5 MG Tabs tablet Commonly known as:  ELIQUIS Take 1 tablet (2.5 mg total) by mouth 2 (two) times daily.   ascorbic acid 500 MG tablet Commonly known as:  VITAMIN C Take 1 tablet (500 mg total) by mouth 2 (two) times daily.   aspirin EC 81 MG tablet Take 1 tablet (81 mg total) by mouth daily.   atorvastatin 80 MG tablet Commonly known as:  LIPITOR Take 80 mg by mouth at bedtime.   brimonidine 0.2 % ophthalmic solution Commonly known as:  ALPHAGAN Place 1 drop into both eyes 3 (three) times daily.   calcitRIOL 0.5 MCG capsule Commonly known as:  ROCALTROL Take 0.5 mcg by mouth daily.   carvedilol 6.25 MG tablet Commonly known as:  COREG Take 1 tablet (6.25 mg total) by mouth 2 (two) times daily.   ciprofloxacin 500 MG tablet Commonly known as:  CIPRO Take 1 tablet (500 mg total) by mouth at bedtime.   docusate sodium 100 MG capsule Commonly known as:  COLACE Take 1 capsule (100 mg total) by mouth 2 (two) times daily.   epoetin alfa 10000 UNIT/ML injection Commonly known as:  EPOGEN,PROCRIT Inject 1 mL (10,000 Units total) into the vein Every Tuesday,Thursday,and Saturday with dialysis.   famotidine 20 MG tablet Commonly known as:  PEPCID Take 20 mg by mouth 2 (two) times daily.   feeding supplement (NEPRO CARB STEADY) Liqd Take 237 mLs by mouth 2 (two) times daily between meals.   folic acid 1 MG tablet Commonly known as:  FOLVITE Take 1 tablet (1 mg total) by mouth daily.   isosorbide mononitrate 30 MG 24 hr tablet Commonly known as:   IMDUR Take 30 mg by mouth daily.   levETIRAcetam 500 MG tablet Commonly known as:  KEPPRA Take 1 tablet (500 mg total) by mouth 2 (two) times daily. What changed:  Another medication with the same name was changed. Make sure you understand how and when to take each.   levETIRAcetam 250 MG tablet Commonly known as:  KEPPRA Three times weekly after dialysis What changed:    how much to take  how to take this  when to take this  additional instructions   metroNIDAZOLE 500 MG tablet Commonly known as:  FLAGYL Take 1 tablet (500 mg total) by mouth every 12 (twelve) hours.   midodrine 10 MG tablet Commonly known as:  PROAMATINE Take 1 tablet (10  mg total) by mouth 3 (three) times a week. What changed:  additional instructions   mirtazapine 7.5 MG tablet Commonly known as:  REMERON Take by mouth.   multivitamin Tabs tablet Take 1 tablet by mouth at bedtime.   nitroGLYCERIN 0.4 MG SL tablet Commonly known as:  NITROSTAT Place 1 tablet (0.4 mg total) under the tongue every 5 (five) minutes as needed for chest pain.   Omega-3 1000 MG Caps Take 2 capsules by mouth daily.   ondansetron 4 MG tablet Commonly known as:  ZOFRAN Take 4 mg by mouth every 8 (eight) hours as needed for nausea or vomiting.   Polyethyl Glycol-Propyl Glycol 0.4-0.3 % Soln Place 1 drop into both eyes 2 (two) times daily.   PRORENAL + D Tabs Take 1 tablet by mouth daily.   rOPINIRole 0.25 MG tablet Commonly known as:  REQUIP Take 1 tablet (0.25 mg total) by mouth at bedtime. What changed:    how much to take  when to take this   sevelamer carbonate 800 MG tablet Commonly known as:  RENVELA Take 800 mg by mouth 3 (three) times daily with meals.   timolol 0.5 % ophthalmic solution Commonly known as:  TIMOPTIC Place 1 drop into both eyes 3 (three) times daily.   traMADol 50 MG tablet Commonly known as:  ULTRAM Take 1 tablet (50 mg total) by mouth every 8 (eight) hours as needed for  moderate pain or severe pain.   valsartan 80 MG tablet Commonly known as:  DIOVAN Take by mouth.   vitamin B-12 1000 MCG tablet Commonly known as:  CYANOCOBALAMIN Take 1 tablet (1,000 mcg total) by mouth daily.            Durable Medical Equipment  (From admission, onward)         Start     Ordered   05/11/18 1241  For home use only DME Vac  Once    Comments:  Settings: 125, continuous, changed M-W-F, to left BKA stump wound   05/11/18 1242           DISCHARGE INSTRUCTIONS:    Follow-up for facility, follow-up infectious disease and vascular surgery.  If you experience worsening of your admission symptoms, develop shortness of breath, life threatening emergency, suicidal or homicidal thoughts you must seek medical attention immediately by calling 911 or calling your MD immediately  if symptoms less severe.  You Must read complete instructions/literature along with all the possible adverse reactions/side effects for all the Medicines you take and that have been prescribed to you. Take any new Medicines after you have completely understood and accept all the possible adverse reactions/side effects.   Please note  You were cared for by a hospitalist during your hospital stay. If you have any questions about your discharge medications or the care you received while you were in the hospital after you are discharged, you can call the unit and asked to speak with the hospitalist on call if the hospitalist that took care of you is not available. Once you are discharged, your primary care physician will handle any further medical issues. Please note that NO REFILLS for any discharge medications will be authorized once you are discharged, as it is imperative that you return to your primary care physician (or establish a relationship with a primary care physician if you do not have one) for your aftercare needs so that they can reassess your need for medications and monitor your lab  values.  Today   CHIEF COMPLAINT:   Chief Complaint  Patient presents with  . Wound Infection    HISTORY OF PRESENT ILLNESS:  Stephanie Ellis  is a 68 y.o. female presented with a wound infection   VITAL SIGNS:  Blood pressure (!) 156/63, pulse 71, temperature (!) 97.4 F (36.3 C), resp. rate 19, height 5' 4"  (1.626 m), weight 45.4 kg, SpO2 100 %.  I/O:    Intake/Output Summary (Last 24 hours) at 05/12/2018 0835 Last data filed at 05/12/2018 0651 Gross per 24 hour  Intake 3 ml  Output 1 ml  Net 2 ml    PHYSICAL EXAMINATION:  GENERAL:  68 y.o.-year-old patient lying in the bed with no acute distress.  EYES: Pupils equal, round, reactive to light and accommodation. No scleral icterus. Extraocular muscles intact.  HEENT: Head atraumatic, normocephalic. Oropharynx and nasopharynx clear.  NECK:  Supple, no jugular venous distention. No thyroid enlargement, no tenderness.  LUNGS: Normal breath sounds bilaterally, no wheezing, rales,rhonchi or crepitation. No use of accessory muscles of respiration.  CARDIOVASCULAR: S1, S2 normal. No murmurs, rubs, or gallops.  ABDOMEN: Soft, non-tender, non-distended. Bowel sounds present. No organomegaly or mass.  EXTREMITIES: No cyanosis, or clubbing.  NEUROLOGIC: Cranial nerves II through XII are intact. Sensation intact. Gait not checked.  PSYCHIATRIC: The patient is alert and answers questions appropriately.  SKIN: Right leg BKA site healed.  Left leg covered with wound VAC.  DATA REVIEW:   CBC Recent Labs  Lab 05/11/18 1014  WBC 11.1*  HGB 11.2*  HCT 38.5  PLT 299    Chemistries  Recent Labs  Lab 05/08/18 0410 05/11/18 1014  NA 139 137  K 3.4* 4.4  CL 98 96*  CO2 32 29  GLUCOSE 153* 224*  BUN 16 40*  CREATININE 4.16* 6.51*  CALCIUM 9.8 11.0*  MG 1.9  --      Microbiology Results  Results for orders placed or performed during the hospital encounter of 05/02/18  Aerobic/Anaerobic Culture (surgical/deep wound)      Status: None (Preliminary result)   Collection Time: 05/02/18  6:20 PM  Result Value Ref Range Status   Specimen Description   Final    WOUND Performed at [facility], [address], [place], [place] [address]    Special Requests   Final    Normal Performed at [facility], [address] [place], [place] [address]    Gram Stain   Final    FEW WBC PRESENT, PREDOMINANTLY PMN ABUNDANT GRAM VARIABLE ROD    Culture   Final    MODERATE PSEUDOMONAS AERUGINOSA MODERATE VANCOMYCIN RESISTANT ENTEROCOCCUS NO ANAEROBES ISOLATED Sent to [facility] for further susceptibility testing. Performed at [facility], [place] [address], [place], [place] [address]    Report Status PENDING  Incomplete   Organism ID, Bacteria PSEUDOMONAS AERUGINOSA  Final   Organism ID, Bacteria VANCOMYCIN RESISTANT ENTEROCOCCUS  Final      Susceptibility   Pseudomonas aeruginosa - MIC*    CEFTAZIDIME 16 INTERMEDIATE Intermediate     CIPROFLOXACIN <=0.25 SENSITIVE Sensitive     GENTAMICIN <=1 SENSITIVE Sensitive     IMIPENEM 1 SENSITIVE Sensitive     CEFEPIME INTERMEDIATE Intermediate     * MODERATE PSEUDOMONAS AERUGINOSA   Vancomycin resistant enterococcus - MIC*    AMPICILLIN >=32 RESISTANT Resistant     VANCOMYCIN >=32 RESISTANT Resistant     GENTAMICIN SYNERGY SENSITIVE Sensitive     LINEZOLID 2 SENSITIVE Sensitive     *  MODERATE VANCOMYCIN RESISTANT ENTEROCOCCUS  Blood culture (routine x 2)     Status: None   Collection Time: 05/02/18  6:20 PM  Result Value Ref Range Status   Specimen Description BLOOD LFA  Final   Special Requests   Final    BOTTLES DRAWN AEROBIC AND ANAEROBIC Blood Culture adequate volume   Culture   Final    NO GROWTH 5 DAYS Performed at [facility], [address] [place], [place] [address]    Report Status 05/07/2018 FINAL  Final  Susceptibility, Aer + Anaerob     Status: None   Collection Time: 05/02/18  6:20 PM  Result Value Ref Range Status    Suscept, Aer + Anaerob Preliminary report  Final    Comment: (NOTE) Performed At: [facility] [place], [place] [id number] Rush Farmer MD [id number]    Source of Sample WOUND  Final    Comment: Performed at [facility], [place] [address] [place], [place] [address]  Susceptibility Result     Status: Abnormal   Collection Time: 05/02/18  6:20 PM  Result Value Ref Range Status   Suscept Result 1 Comment (A)  Final    Comment: (NOTE) Pseudomonas aeruginosa Performed At: [facility] [place], [place] [id number] Rush Farmer MD [id number]   Blood culture (routine x 2)     Status: None   Collection Time: 05/02/18  6:24 PM  Result Value Ref Range Status   Specimen Description BLOOD LWRIST  Final   Special Requests   Final    BOTTLES DRAWN AEROBIC AND ANAEROBIC Blood Culture adequate volume   Culture   Final    NO GROWTH 5 DAYS Performed at [facility], [address] [place], [place] [address]    Report Status 05/07/2018 FINAL  Final  MRSA PCR Screening     Status: None   Collection Time: 05/03/18  1:04 AM  Result Value Ref Range Status   MRSA by PCR NEGATIVE NEGATIVE Final    Comment:        The GeneXpert MRSA Assay (FDA approved for NASAL specimens only), is one component of a comprehensive MRSA colonization surveillance program. It is not intended to diagnose MRSA infection nor to guide or monitor treatment for MRSA infections. Performed at [facility], [address] [place], [place] [address]   Aerobic/Anaerobic Culture (surgical/deep wound)     Status: None (Preliminary result)   Collection Time: 05/08/18  1:55 PM  Result Value Ref Range Status   Specimen Description   Final    WOUND Performed at [facility], [address] [place], [place] [address]    Special Requests   Final    NONE Performed at [facility], [address] [place], [place] [address]     Gram Stain   Final    RARE WBC PRESENT, PREDOMINANTLY PMN RARE GRAM POSITIVE COCCI RARE GRAM POSITIVE RODS RARE GRAM VARIABLE ROD    Culture   Final    MODERATE PSEUDOMONAS AERUGINOSA NO ANAEROBES ISOLATED; CULTURE IN PROGRESS FOR 5 DAYS    Report Status PENDING  Incomplete   Organism ID, Bacteria PSEUDOMONAS AERUGINOSA  Final      Susceptibility   Pseudomonas aeruginosa - MIC*    CEFTAZIDIME >=64 RESISTANT Resistant     CIPROFLOXACIN <=0.25 SENSITIVE Sensitive     GENTAMICIN <=1 SENSITIVE Sensitive     IMIPENEM <=0.25 SENSITIVE Sensitive     CEFEPIME 32 RESISTANT  Resistant     * MODERATE PSEUDOMONAS AERUGINOSA     Management plans discussed with the patient, family and they are in agreement.  CODE STATUS:     Code Status Orders  (From admission, onward)         Start     Ordered   05/06/18 1739  Limited resuscitation (code)  Continuous    Question Answer Comment  In the event of cardiac or respiratory ARREST: Initiate Code Blue, Call Rapid Response Yes   In the event of cardiac or respiratory ARREST: Perform CPR Yes   In the event of cardiac or respiratory ARREST: Perform Intubation/Mechanical Ventilation No   In the event of cardiac or respiratory ARREST: Use NIPPV/BiPAp only if indicated Yes   In the event of cardiac or respiratory ARREST: Administer ACLS medications if indicated Yes   In the event of cardiac or respiratory ARREST: Perform Defibrillation or Cardioversion if indicated Yes   Comments DNI      05/06/18 1738        Code Status History    Date Active Date Inactive Code Status Order ID Comments User Context   05/02/2018 2122 05/06/2018 1738 DNR [id number]  Dustin Flock, MD ED   04/13/2018 1448 04/16/2018 2342 DNR [id number]  Fritzi Mandes, MD Inpatient   03/19/2018 1215 03/23/2018 1906 DNR [id number]  Fritzi Mandes, MD Inpatient   03/14/2018 2310 03/18/2018 2018 DNR [id number]  Erlene Quan, NP Inpatient   03/10/2018 0837 03/14/2018 2310 Full  Code [id number]  Gladstone Lighter, MD Inpatient   02/18/2018 1000 02/28/2018 2122 Full Code [id number]  Loletha Grayer, MD ED   02/01/2018 0412 02/15/2018 2250 Full Code [id number]  Arta Silence, MD Inpatient   01/31/2018 2300 02/01/2018 0412 DNR [id number]  Amelia Jo, MD Inpatient   01/31/2018 2037 01/31/2018 2300 DNR [id number]  Awilda Bill, NP Inpatient   01/29/2018 2250 01/31/2018 2037 Full Code [id number]  Amelia Jo, MD Inpatient   10/24/2016 1055 10/25/2016 2243 Full Code [id number]  Hillary Bow, MD ED   10/17/2016 2143 10/19/2016 1440 Full Code [id number]  Vaughan Basta, MD Inpatient   09/04/2016 1708 09/05/2016 1500 Full Code [id number]  Epifanio Lesches, MD ED   08/22/2016 0938 08/22/2016 1833 Full Code [id number]  Algernon Huxley, MD Inpatient   06/27/2016 1121 06/27/2016 1846 Full Code [id number]  Algernon Huxley, MD Inpatient   10/03/2015 2235 10/04/2015 1724 Full Code [id number]  Harrie Foreman, MD Inpatient    Advance Directive Documentation     Most Recent Value  Type of Advance Directive  Healthcare Power of Attorney  Pre-existing out of facility DNR order (yellow form or pink MOST form)  Yellow form placed in chart (order not valid for inpatient use)  "MOST" Form in Place?  -      TOTAL TIME TAKING CARE OF THIS PATIENT: 35 minutes.    Loletha Grayer M.D on 05/12/2018 at 8:35 AM  Between 7am to 6pm - Pager - [phone]  After 6pm go to [email or web address] - password [email or web address]  [facility] Office  [phone]  CC: Primary care physician; Hanley Ben, NP

## 2018-05-14 LAB — AEROBIC/ANAEROBIC CULTURE W GRAM STAIN (SURGICAL/DEEP WOUND)

## 2018-05-15 ENCOUNTER — Telehealth: Payer: Self-pay | Admitting: Licensed Clinical Social Worker

## 2018-05-15 NOTE — Telephone Encounter (Signed)
Stephanie Ellis, patients daughter called stating for the past 2 days she has been very confused. She has been yelling, and calling family every five minutes wanting to leave, she has forgotten how to use her wheelchair. Daughter states that she is very agitated. She is currently at [facility]

## 2018-05-15 NOTE — Telephone Encounter (Signed)
Spoke to her daughter and told her that we will stop ciprofloxacin and star Gentamycin- can you call [facility] where she is currently and speak to her nurse to stop cipro- We can fax an order to them. I will speak to the nephrologist regarding gentamycin during dialysis. thanks

## 2018-05-15 NOTE — Telephone Encounter (Signed)
Spoke with Vaughan Basta, nurse at [facility]. She took my verbal order to discontinue Ciprofloxacin and will relay it to the nurse practitioner.

## 2018-05-18 LAB — AEROBIC/ANAEROBIC CULTURE W GRAM STAIN (SURGICAL/DEEP WOUND): Special Requests: NORMAL

## 2018-05-18 LAB — AEROBIC/ANAEROBIC CULTURE (SURGICAL/DEEP WOUND)

## 2018-05-19 ENCOUNTER — Ambulatory Visit (INDEPENDENT_AMBULATORY_CARE_PROVIDER_SITE_OTHER): Payer: Medicare Other | Admitting: Vascular Surgery

## 2018-05-20 LAB — MIC RESULT

## 2018-05-20 LAB — MINIMUM INHIBITORY CONC. (1 DRUG)

## 2018-05-21 ENCOUNTER — Encounter: Payer: Self-pay | Admitting: Infectious Diseases

## 2018-05-21 ENCOUNTER — Ambulatory Visit: Payer: Medicare Other | Attending: Infectious Diseases | Admitting: Infectious Diseases

## 2018-05-21 VITALS — BP 177/80 | HR 107 | Temp 98.1°F

## 2018-05-21 DIAGNOSIS — Z951 Presence of aortocoronary bypass graft: Secondary | ICD-10-CM

## 2018-05-21 DIAGNOSIS — B965 Pseudomonas (aeruginosa) (mallei) (pseudomallei) as the cause of diseases classified elsewhere: Secondary | ICD-10-CM | POA: Diagnosis not present

## 2018-05-21 DIAGNOSIS — Z79899 Other long term (current) drug therapy: Secondary | ICD-10-CM

## 2018-05-21 DIAGNOSIS — I2510 Atherosclerotic heart disease of native coronary artery without angina pectoris: Secondary | ICD-10-CM

## 2018-05-21 DIAGNOSIS — A498 Other bacterial infections of unspecified site: Secondary | ICD-10-CM

## 2018-05-21 DIAGNOSIS — Z89512 Acquired absence of left leg below knee: Secondary | ICD-10-CM

## 2018-05-21 DIAGNOSIS — Z888 Allergy status to other drugs, medicaments and biological substances status: Secondary | ICD-10-CM

## 2018-05-21 DIAGNOSIS — E1122 Type 2 diabetes mellitus with diabetic chronic kidney disease: Secondary | ICD-10-CM | POA: Diagnosis not present

## 2018-05-21 DIAGNOSIS — I132 Hypertensive heart and chronic kidney disease with heart failure and with stage 5 chronic kidney disease, or end stage renal disease: Secondary | ICD-10-CM | POA: Diagnosis not present

## 2018-05-21 DIAGNOSIS — I5030 Unspecified diastolic (congestive) heart failure: Secondary | ICD-10-CM

## 2018-05-21 DIAGNOSIS — Z992 Dependence on renal dialysis: Secondary | ICD-10-CM

## 2018-05-21 DIAGNOSIS — N186 End stage renal disease: Secondary | ICD-10-CM

## 2018-05-21 DIAGNOSIS — Z89511 Acquired absence of right leg below knee: Secondary | ICD-10-CM

## 2018-05-21 DIAGNOSIS — E1151 Type 2 diabetes mellitus with diabetic peripheral angiopathy without gangrene: Secondary | ICD-10-CM

## 2018-05-21 DIAGNOSIS — T8744 Infection of amputation stump, left lower extremity: Secondary | ICD-10-CM | POA: Diagnosis not present

## 2018-05-21 DIAGNOSIS — Z885 Allergy status to narcotic agent status: Secondary | ICD-10-CM

## 2018-05-21 DIAGNOSIS — Z978 Presence of other specified devices: Secondary | ICD-10-CM

## 2018-05-21 NOTE — Progress Notes (Signed)
NAME: Stephanie Ellis  DOB: January 30, 1951  MRN: [id number]  Date/Time: 05/21/2018 11:24 AM Subjective:  REASON FOR CONSULT: follow up after  hospital discharge 68 yr female Was recently discharged from [facility]- 05/02/18-05/12/18 was there for infected left BKA stump and underwent debridement and wound vac placement- culture positive for pseudomonas- she was sent to [facility] on ciprofloxacin and flagyl but she became more confused than baseline and her daughter called me and cipro was stopped on 05/15/18. She has been getting gentamycin during dialysis and flagyl- last day 05/24/18 She is doing well from wound perspective- has a wound vac . ? 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 (HFpEF) heart failure with preserved ejection fraction ([place])    a. 01/2018 Echo: EF 50-55%, no rwma, Gr1 DD, triv AI, mild MR. Midly dil LA/RV, mod reduced RV fxn. Irreg thickening of TV w/ mobile echodensity that appears to arise from valve.  . Anemia   . Anorexia   . Bacteremia due to Pseudomonas   . Carotid arterial disease ([place])    a. 01/2017 Carotid U/S: RICA <78, LICA <29.  Marland Kitchen Complication of anesthesia    a. Pt reports h/o complication on 9 different occasions - ? hypotension/arrest.  . 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 01/2015 Cath [facility]): 70% LM, p-mLAD 50-60% (Resting FFR 0.75), mRCA 80-90%, ~40 Ost OM & D1-->CABG; b. 01/2016 Staged PCI of LCX x 2 and RCA.  Marland Kitchen ESRD (end stage renal disease) on dialysis ([place])    a. ESRD secondary to acute kidney failure s/p CABG-->PD  . Essential hypertension   . GERD (gastroesophageal reflux disease)   . Heart murmur   . Hypercholesterolemia   . Myocardial infarction ([place]) 2016  . PVD (peripheral  vascular disease) ([place])    a. 01/30/2018 PV Angio: Sev Left Popliteal dzs s/p PTA w/ 1m lutonix DEB w/ mech aspiration of the L popliteal, L AT, and Left tibioperoneal trunck and peroneal artery.  . 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  [facility]: Dr. BMarland KitchenHaithcock: CABG x 3, LIMA to LAD, SVG to RCA, SVG to OM3, EVH  . Sinusitis 2019  . Type II diabetes mellitus with complication (HCC)    CAD    Past Surgical History:  Procedure Laterality Date  . ABDOMINAL HYSTERECTOMY    . AMPUTATION Right 02/25/2018   Procedure: AMPUTATION BELOW KNEE;  Surgeon: SKatha Cabal MD;  Location: [facility];  Service: Vascular;  Laterality: Right;  . AMPUTATION Left 03/11/2018   Procedure: AMPUTATION BELOW KNEE;  Surgeon: DAlgernon Huxley MD;  Location: [facility];  Service: Vascular;  Laterality: Left;  . AMPUTATION Bilateral 04/13/2018   Procedure: REVISION OF BILATERAL AMPUTATIONS BELOW KNEE WITH WOUND VAC APPLICATIONS;  Surgeon: EEvaristo Bury MD;  Location: [facility];  Service: Vascular;  Laterality: Bilateral;  . AMPUTATION TOE Left 02/06/2018   Procedure: AMPUTATION GREAT TOE;  Surgeon: FSamara Deist DPM;  Location: [facility];  Service: Podiatry;  Laterality: Left;  . APPLICATION OF WOUND VAC Left 04/13/2018   Procedure: APPLICATION OF WOUND VAC;  Surgeon: EEvaristo Bury MD;  Location: [facility];  Service: Vascular;  Laterality: Left;  . ARTERIOVENOUS GRAFT PLACEMENT  05/2016  . AV FISTULA PLACEMENT Left 05/30/2016   Procedure: ARTERIOVENOUS graft;  Surgeon:  Algernon Huxley, MD;  Location: [facility];  Service: Vascular;  Laterality: Left;  . CAPD INSERTION N/A 10/30/2017   Procedure: LAPAROSCOPIC INSERTION CONTINUOUS AMBULATORY PERITONEAL DIALYSIS  (CAPD) CATHETER;  Surgeon: Algernon Huxley, MD;  Location: [facility];  Service: Vascular;  Laterality: N/A;  . CARDIAC CATHETERIZATION  01/2015   [facility]: Ost LM 70%, p-m LAD 50-60% (Rest FFR + @ 0.75), mRCA 80-90%, ostD1 40%, pOM1 40%  . CATARACT EXTRACTION W/PHACO Left  01/18/2016   Procedure: CATARACT EXTRACTION PHACO AND INTRAOCULAR LENS PLACEMENT (IOC);  Surgeon: Eulogio Bear, MD;  Location: [facility];  Service: Ophthalmology;  Laterality: Left;  [place] 1.05 AP% 15.5 CDE 10.16 Fluid Pack Lot # [id number]  . CATARACT EXTRACTION W/PHACO Right 08/01/2016   Procedure: CATARACT EXTRACTION PHACO AND INTRAOCULAR LENS PLACEMENT (IOC);  Surgeon: Eulogio Bear, MD;  Location: [facility];  Service: Ophthalmology;  Laterality: Right;  [place] 01:00.6 AP% 11.4 CDE 6.93  LOT # [id number]  . COLONOSCOPY    . CORONARY ANGIOPLASTY     SENTS 02/12/16  . CORONARY ARTERY BYPASS GRAFT  03/28/15    [facility]: Dr. Waldemar Dickens: LIMA to LAD, SVG to RCA, SVG to OM3, EVH  . DIALYSIS/PERMA CATHETER INSERTION N/A 02/11/2018   Procedure: DIALYSIS/PERMA CATHETER INSERTION;  Surgeon: Algernon Huxley, MD;  Location: [place] [facility];  Service: Cardiovascular;  Laterality: N/A;  . DIALYSIS/PERMA CATHETER REMOVAL Right 02/04/2018   Procedure: DIALYSIS/PERMA CATHETER REMOVAL;  Surgeon: Katha Cabal, MD;  Location: [place] [facility];  Service: Cardiovascular;  Laterality: Right;  . ESOPHAGOGASTRODUODENOSCOPY (EGD) WITH PROPOFOL N/A 10/24/2016   Procedure: ESOPHAGOGASTRODUODENOSCOPY (EGD) WITH PROPOFOL;  Surgeon: [name], MD;  Location: [facility];  Service: Endoscopy;  Laterality: N/A;  . EYE SURGERY Bilateral    cataract surgery  . EYE SURGERY     drains for glaucoma  . INSERTION EXPRESS TUBE SHUNT Right 08/01/2016   Procedure: INSERTION EXPRESS TUBE SHUNT;  Surgeon: Eulogio Bear, MD;  Location: [facility];  Service: Ophthalmology;  Laterality: Right;  . INSERTION OF AHMED VALVE Left 08/15/2016   Procedure: INSERTION OF AHMED VALVE;  Surgeon: Eulogio Bear, MD;  Location: [facility];  Service: Ophthalmology;  Laterality: Left;  . INSERTION OF DIALYSIS CATHETER    . LOWER EXTREMITY ANGIOGRAPHY Right 02/19/2018   Procedure: Lower Extremity Angiography;  Surgeon: Algernon Huxley, MD;   Location: [place] [facility];  Service: Cardiovascular;  Laterality: Right;  . LOWER EXTREMITY ANGIOGRAPHY Left 01/30/2018   Procedure: Lower Extremity Angiography;  Surgeon: Katha Cabal, MD;  Location: [place] [facility];  Service: Cardiovascular;  Laterality: Left;  . REMOVAL OF A DIALYSIS CATHETER N/A 02/25/2018   Procedure: REMOVAL OF A DIALYSIS CATHETER;  Surgeon: Katha Cabal, MD;  Location: [facility];  Service: Vascular;  Laterality: N/A;  . TEMPORARY DIALYSIS CATHETER N/A 02/06/2018   Procedure: TEMPORARY DIALYSIS CATHETER;  Surgeon: Katha Cabal, MD;  Location: [place] [facility];  Service: Cardiovascular;  Laterality: N/A;  . TRANSTHORACIC ECHOCARDIOGRAM  January 2017    EF 60-65%. GR 2 DD. Mild degenerative mitral valve disease but no prolapse or regurgitation. Mild left atrial dilation. Mild to moderate LVH. Pericardial effusion gone  . UPPER EXTREMITY ANGIOGRAPHY Left 06/27/2016   Procedure: Upper Extremity Angiography;  Surgeon: Algernon Huxley, MD;  Location: [place] [facility];  Service: Cardiovascular;  Laterality: Left;  . UPPER EXTREMITY ANGIOGRAPHY Left 08/22/2016   Procedure: Upper Extremity Angiography;  Surgeon: Algernon Huxley,  MD;  Location: [place] [facility];  Service: Cardiovascular;  Laterality: Left;  Marland Kitchen VASCULAR SURGERY    . WOUND DEBRIDEMENT Left 05/08/2018   Procedure: DEBRIDEMENT OF LEFT LEG BKA;  Surgeon: Algernon Huxley, MD;  Location: [facility];  Service: General;  Laterality: Left;   SH Currently at [facility] Non smoker Daughter lives in [place]  Family History  Problem Relation Age of Onset  . Diabetes Mellitus II Mother   . Heart failure Mother   . Pancreatic cancer Father    Allergies  Allergen Reactions  . Chlorthalidone Anaphylaxis, Itching and Rash  . Fentanyl Rash  . Midazolam Rash  . Ace Inhibitors Other (See Comments)    Reaction:  Hyperkalemia, agitation   . Angiotensin Receptor Blockers Other (See Comments)     Reaction:  Hyperkalemia, agitation   . Norvasc [Amlodipine] Itching and Rash  . Phenergan [Promethazine Hcl] Anxiety    "antsy, can't sit still"    ? Current Outpatient Medications  Medication Sig Dispense Refill  . acetaminophen (TYLENOL) 325 MG tablet Take 2 tablets (650 mg total) by mouth every 6 (six) hours as needed for mild pain (or Fever >/= 101).    Marland Kitchen apixaban (ELIQUIS) 2.5 MG TABS tablet Take 1 tablet (2.5 mg total) by mouth 2 (two) times daily. 60 tablet 0  . aspirin EC 81 MG tablet Take 1 tablet (81 mg total) by mouth daily.    Marland Kitchen atorvastatin (LIPITOR) 80 MG tablet Take 80 mg by mouth at bedtime.     . brimonidine (ALPHAGAN) 0.2 % ophthalmic solution Place 1 drop into both eyes 3 (three) times daily. 5 mL 12  . calcitRIOL (ROCALTROL) 0.5 MCG capsule Take 0.5 mcg by mouth daily.    . carvedilol (COREG) 6.25 MG tablet Take 1 tablet (6.25 mg total) by mouth 2 (two) times daily.    Marland Kitchen docusate sodium (COLACE) 100 MG capsule Take 1 capsule (100 mg total) by mouth 2 (two) times daily. 10 capsule 0  . epoetin alfa (EPOGEN,PROCRIT) 22297 UNIT/ML injection Inject 1 mL (10,000 Units total) into the vein Every Tuesday,Thursday,and Saturday with dialysis. 1 mL   . famotidine (PEPCID) 20 MG tablet Take 20 mg by mouth 2 (two) times daily.    . folic acid (FOLVITE) 1 MG tablet Take 1 tablet (1 mg total) by mouth daily. 30 tablet 3  . isosorbide mononitrate (IMDUR) 30 MG 24 hr tablet Take 30 mg by mouth daily.     Marland Kitchen levETIRAcetam (KEPPRA) 250 MG tablet Three times weekly after dialysis    . levETIRAcetam (KEPPRA) 500 MG tablet Take 1 tablet (500 mg total) by mouth 2 (two) times daily.    . metroNIDAZOLE (FLAGYL) 500 MG tablet Take 1 tablet (500 mg total) by mouth every 12 (twelve) hours. 19 tablet 0  . midodrine (PROAMATINE) 10 MG tablet Take 1 tablet (10 mg total) by mouth 3 (three) times a week. (Patient taking differently: Take 10 mg by mouth 3 (three) times a week. Take Tuesday, Thursday, and  Saturday) 30 tablet 0  . mirtazapine (REMERON) 7.5 MG tablet Take by mouth.    . Multiple Vitamins-Minerals (PRORENAL + D) TABS Take 1 tablet by mouth daily.    . multivitamin (RENA-VIT) TABS tablet Take 1 tablet by mouth at bedtime. 30 tablet 0  . nitroGLYCERIN (NITROSTAT) 0.4 MG SL tablet Place 1 tablet (0.4 mg total) under the tongue every 5 (five) minutes as needed for chest pain. 30 tablet 0  . Nutritional  Supplements (FEEDING SUPPLEMENT, NEPRO CARB STEADY,) LIQD Take 237 mLs by mouth 2 (two) times daily between meals. 60 Can 0  . Omega-3 1000 MG CAPS Take 2 capsules by mouth daily.     . ondansetron (ZOFRAN) 4 MG tablet Take 4 mg by mouth every 8 (eight) hours as needed for nausea or vomiting.    Vladimir Faster Glycol-Propyl Glycol 0.4-0.3 % SOLN Place 1 drop into both eyes 2 (two) times daily.    Marland Kitchen rOPINIRole (REQUIP) 0.25 MG tablet Take 1 tablet (0.25 mg total) by mouth at bedtime.    . sevelamer carbonate (RENVELA) 800 MG tablet Take 800 mg by mouth 3 (three) times daily with meals.    . timolol (TIMOPTIC) 0.5 % ophthalmic solution Place 1 drop into both eyes 3 (three) times daily. 10 mL 12  . traMADol (ULTRAM) 50 MG tablet Take 1 tablet (50 mg total) by mouth every 8 (eight) hours as needed for moderate pain or severe pain. 15 tablet 0  . valsartan (DIOVAN) 80 MG tablet Take by mouth.    . vitamin B-12 (CYANOCOBALAMIN) 1000 MCG tablet Take 1 tablet (1,000 mcg total) by mouth daily. 30 tablet 0  . vitamin C (VITAMIN C) 500 MG tablet Take 1 tablet (500 mg total) by mouth 2 (two) times daily. 60 tablet 0  . ciprofloxacin (CIPRO) 500 MG tablet Take 1 tablet (500 mg total) by mouth at bedtime. (Patient not taking: Reported on 05/21/2018) 10 tablet 0   No current facility-administered medications for this visit.      Abtx:  Anti-infectives (From admission, onward)   None      REVIEW OF SYSTEMS:  Const: negative fever, negative chills, some weight loss Eyes: negative diplopia or visual  changes, left eye poor vision ENT: negative coryza, negative sore throat Resp: negative cough, hemoptysis, dyspnea Cards: negative for chest pain, palpitations, lower extremity edema GU: negative for frequency, dysuria and hematuria GI: Negative for abdominal pain, diarrhea, bleeding, constipation Skin: negative for rash and pruritus Heme: negative for easy bruising and gum/nose bleeding MS: negative for myalgias, arthralgias, back pain and muscle weakness, has fatigue Neurolo:negative for headaches, dizziness, vertigo, some memory problems  Psych:  depression  Endocrine: negative for thyroid, diabetes  Objective:  VITALS:  BP (!) 177/80 (BP Location: Right Arm, Patient Position: Sitting, Cuff Size: Normal)   Pulse (!) 107   Temp 98.1 F (36.7 C) (Oral)  PHYSICAL EXAM:  Limited examination- pt in wheel chair General: sleepy, but woke up on calling her and was conversant , cooperative, no distress, appears stated age.  Head: Normocephalic, without obvious abnormality, atraumatic. Eyes: edema lids Neck: Supple, symmetrical, no adenopathy, thyroid: non tender no carotid bruit and no JVD. Lungs: Clear to auscultation bilaterally. No Wheezing or Rhonchi. No rales. Heart: s1s2 Abdomen: Soft,  Extremities:rt BKA site  Healed ell Left BKA stump=wound- covered with wound vac which was removed- Healthy granulating wound' No evidence of infection  05/21/18    05/11/18   05/05/18      Skin: No rashes or lesions. Or bruising Neurologic: did not exam in detail Pertinent Labs Lab Results CBC    Component Value Date/Time   WBC 11.1 (H) 05/11/2018 1014   RBC 4.02 05/11/2018 1014   HGB 11.2 (L) 05/11/2018 1014   HCT 38.5 05/11/2018 1014   PLT 299 05/11/2018 1014   MCV 95.8 05/11/2018 1014   MCH 27.9 05/11/2018 1014   MCHC 29.1 (L) 05/11/2018 1014   RDW 18.5 (H) 05/11/2018  1014   LYMPHSABS 1.8 05/02/2018 1820   MONOABS 0.9 05/02/2018 1820   EOSABS 0.2 05/02/2018 1820    BASOSABS 0.0 05/02/2018 1820    CMP Latest Ref Rng & Units 05/11/2018 05/08/2018 05/05/2018  Glucose 70 - 99 mg/dL 224(H) 153(H) 165(H)  BUN 8 - 23 mg/dL 40(H) 16 35(H)  Creatinine 0.44 - 1.00 mg/dL 6.51(H) 4.16(H) 6.76(H)  Sodium 135 - 145 mmol/L 137 139 140  Potassium 3.5 - 5.1 mmol/L 4.4 3.4(L) 3.7  Chloride 98 - 111 mmol/L 96(L) 98 102  CO2 22 - 32 mmol/L 29 32 27  Calcium 8.9 - 10.3 mg/dL 11.0(H) 9.8 10.4(H)  Total Protein 6.5 - 8.1 g/dL - - -  Total Bilirubin 0.3 - 1.2 mg/dL - - -  Alkaline Phos 38 - 126 U/L - - -  AST 15 - 41 U/L - - -  ALT 0 - 44 U/L - - -     ? Impression/Recommendation ? 68 y.o. female with a history of end-stage renal disease, peripheral vascular disease, essential hypertension, diabetes mellitus b/l BKA- , infected left BKA stump ? ?Infected stump of the left BKA.  wound debrided while she was in hospital and she has a wound vac and on oral flagyl and Iv gentamycin( given during dialysis) until 05/24/18 Wound looks much better- Follow up with vascular surgeon  Right BKA site healthy  End-stage renal disease on dialysis  Peripheral arterial disease  Diabetes mellitus   Coronary artery disease status post CABG CHF on carvedilol, Avapro, isosorbide mononitrate, Midodrin  ?Informed her nurse at [facility]

## 2018-05-21 NOTE — Patient Instructions (Addendum)
You are here to follow up after hospital discharge for pseudomonas infection in the left BKA stump and you initially took cipro and then gentam

## 2018-06-03 ENCOUNTER — Telehealth (INDEPENDENT_AMBULATORY_CARE_PROVIDER_SITE_OTHER): Payer: Self-pay

## 2018-06-03 NOTE — Telephone Encounter (Signed)
Helene Kelp from [facility] called wanting to know when the patient's wound vac will be taken off, because the patient cannot go back to assisted living until the vac is off. I explained that the patient no showed for her last appt on 05/19/2018 and has to be seen before a determination is made regarding the wound vac. Helene Kelp decided to make an appt with the front desk.

## 2018-06-08 ENCOUNTER — Ambulatory Visit (INDEPENDENT_AMBULATORY_CARE_PROVIDER_SITE_OTHER): Payer: Medicare Other | Admitting: Vascular Surgery

## 2018-06-08 ENCOUNTER — Encounter (INDEPENDENT_AMBULATORY_CARE_PROVIDER_SITE_OTHER): Payer: Self-pay | Admitting: Vascular Surgery

## 2018-06-08 VITALS — BP 166/67 | HR 71 | Resp 16

## 2018-06-08 DIAGNOSIS — T879 Unspecified complications of amputation stump: Secondary | ICD-10-CM

## 2018-06-08 DIAGNOSIS — E1169 Type 2 diabetes mellitus with other specified complication: Secondary | ICD-10-CM

## 2018-06-08 DIAGNOSIS — Z89512 Acquired absence of left leg below knee: Secondary | ICD-10-CM

## 2018-06-08 DIAGNOSIS — T8740 Infection of amputation stump, unspecified extremity: Secondary | ICD-10-CM

## 2018-06-08 DIAGNOSIS — T8744 Infection of amputation stump, left lower extremity: Secondary | ICD-10-CM

## 2018-06-08 DIAGNOSIS — Z89511 Acquired absence of right leg below knee: Secondary | ICD-10-CM

## 2018-06-08 NOTE — Progress Notes (Signed)
Subjective:    Patient ID: Stephanie Ellis, female    DOB: 07-Aug-1950, 68 y.o.   MRN: [id number] Chief Complaint  Patient presents with  . Follow-up    [facility] 1week wound check   Patient presents for her first post operative follow-up.  On May 08, 2018, the patient underwent an irrigation and debridement of left BKA stump of skin, soft tissue, and muscle for approximately 100cm2 with VAC placement.  The patient did miss her first postoperative follow-up which was scheduled approximately 2 weeks ago.  The patient presents today without complaint.  The patient notes that her nursing home has been providing VAC changes approximately 3 times a week.  The patient feels that her left stump wound is healing well.  She denies any issues with her right stump.  She states that her right stump skin is healthy and intact.  Patient denies any foul odor, drainage or necrotic tissue to the left stump wound.  Patient denies any fever, nausea vomiting.  Review of Systems  Constitutional: Negative.   HENT: Negative.   Eyes: Negative.   Respiratory: Negative.   Cardiovascular: Negative.   Gastrointestinal: Negative.   Endocrine: Negative.   Genitourinary: Negative.   Musculoskeletal: Negative.   Skin: Positive for wound.  Allergic/Immunologic: Negative.   Neurological: Negative.   Hematological: Negative.   Psychiatric/Behavioral: Negative.       Objective:   Physical Exam Vitals signs reviewed.  Constitutional:      Appearance: Normal appearance.  HENT:     Head: Normocephalic and atraumatic.     Right Ear: External ear normal.     Left Ear: External ear normal.     Nose: Nose normal.     Mouth/Throat:     Mouth: Mucous membranes are dry.     Pharynx: Oropharynx is clear.  Eyes:     Extraocular Movements: Extraocular movements intact.     Conjunctiva/sclera: Conjunctivae normal.     Pupils: Pupils are equal, round, and reactive to light.  Neck:     Musculoskeletal: Normal range of  motion.  Cardiovascular:     Rate and Rhythm: Normal rate and regular rhythm.  Pulmonary:     Effort: Pulmonary effort is normal.     Breath sounds: Normal breath sounds.  Musculoskeletal: Normal range of motion.     Comments: Patient is able to straighten both legs at the knee joint  Skin:    Comments: Right BKA stump: Skin is healthy and intact.  There are no signs of breakdown. Left BKA stump: Stump is healing well.  There is 100% granulation tissue noted in the wound.  There is no foul odor.  There is no drainage.  There is no fibrinous exudate noted in the wound.  The surrounding skin is healthy and intact.  Neurological:     General: No focal deficit present.     Mental Status: She is alert. Mental status is at baseline.  Psychiatric:        Mood and Affect: Mood normal.        Behavior: Behavior normal.    BP (!) 166/67 (BP Location: Right Arm)   Pulse 71   Resp 16   Past Medical History:  Diagnosis Date  . (HFpEF) heart failure with preserved ejection fraction ([place])    a. 01/2018 Echo: EF 50-55%, no rwma, Gr1 DD, triv AI, mild MR. Midly dil LA/RV, mod reduced RV fxn. Irreg thickening of TV w/ mobile echodensity that appears to arise from valve.  Marland Kitchen  Anemia   . Anorexia   . Bacteremia due to Pseudomonas   . Carotid arterial disease ([place])    a. 01/2017 Carotid U/S: RICA <13, LICA <24.  Marland Kitchen Complication of anesthesia    a. Pt reports h/o complication on 9 different occasions - ? hypotension/arrest.  . 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 01/2015 Cath [facility]): 70% LM, p-mLAD 50-60% (Resting FFR 0.75), mRCA 80-90%, ~40 Ost OM & D1-->CABG; b. 01/2016 Staged PCI of LCX x 2 and RCA.  Marland Kitchen ESRD (end stage renal disease) on dialysis ([place])    a. ESRD secondary to acute kidney failure s/p CABG-->PD  . Essential hypertension   . GERD (gastroesophageal reflux disease)   . Heart murmur   . Hypercholesterolemia   . Myocardial infarction ([place]) 2016  . PVD  (peripheral vascular disease) ([place])    a. 01/30/2018 PV Angio: Sev Left Popliteal dzs s/p PTA w/ 68m lutonix DEB w/ mech aspiration of the L popliteal, L AT, and Left tibioperoneal trunck and peroneal artery.  . 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  [facility]: Dr. BMarland KitchenHaithcock: CABG x 3, LIMA to LAD, SVG to RCA, SVG to OM3, EVH  . Sinusitis 2019  . Type II diabetes mellitus with complication (HCC)    CAD   Social History   Socioeconomic History  . Marital status: Single    Spouse name: Not on file  . Number of children: Not on file  . Years of education: Not on file  . Highest education level: Not on file  Occupational History  . Not on file  Social Needs  . Financial resource strain: Not on file  . Food insecurity:    Worry: Not on file    Inability: Not on file  . Transportation needs:    Medical: Not on file    Non-medical: Not on file  Tobacco Use  . Smoking status: Never Smoker  . Smokeless tobacco: Never Used  Substance and Sexual Activity  . Alcohol use: No  . Drug use: No  . Sexual activity: Never  Lifestyle  . Physical activity:    Days per week: Not on file    Minutes per session: Not on file  . Stress: Not on file  Relationships  . Social connections:    Talks on phone: Not on file    Gets together: Not on file    Attends religious service: Not on file    Active member of club or organization: Not on file    Attends meetings of clubs or organizations: Not on file    Relationship status: Not on file  . Intimate partner violence:    Fear of current or ex partner: Not on file    Emotionally abused: Not on file    Physically abused: Not on file    Forced sexual activity: Not on file  Other Topics Concern  . Not on file  Social History Narrative   [place]   Past Surgical History:  Procedure Laterality Date  . ABDOMINAL HYSTERECTOMY    . AMPUTATION Right 02/25/2018   Procedure: AMPUTATION BELOW KNEE;  Surgeon: SKatha Cabal MD;   Location: [facility];  Service: Vascular;  Laterality: Right;  . AMPUTATION Left 03/11/2018   Procedure: AMPUTATION BELOW KNEE;  Surgeon: DAlgernon Huxley MD;  Location: [facility];  Service: Vascular;  Laterality: Left;  . AMPUTATION Bilateral 04/13/2018   Procedure: REVISION OF BILATERAL AMPUTATIONS BELOW  KNEE WITH WOUND VAC APPLICATIONS;  Surgeon: Evaristo Bury, MD;  Location: [facility];  Service: Vascular;  Laterality: Bilateral;  . AMPUTATION TOE Left 02/06/2018   Procedure: AMPUTATION GREAT TOE;  Surgeon: Samara Deist, DPM;  Location: [facility];  Service: Podiatry;  Laterality: Left;  . APPLICATION OF WOUND VAC Left 04/13/2018   Procedure: APPLICATION OF WOUND VAC;  Surgeon: Evaristo Bury, MD;  Location: [facility];  Service: Vascular;  Laterality: Left;  . ARTERIOVENOUS GRAFT PLACEMENT  05/2016  . AV FISTULA PLACEMENT Left 05/30/2016   Procedure: ARTERIOVENOUS graft;  Surgeon: Algernon Huxley, MD;  Location: [facility];  Service: Vascular;  Laterality: Left;  . CAPD INSERTION N/A 10/30/2017   Procedure: LAPAROSCOPIC INSERTION CONTINUOUS AMBULATORY PERITONEAL DIALYSIS  (CAPD) CATHETER;  Surgeon: Algernon Huxley, MD;  Location: [facility];  Service: Vascular;  Laterality: N/A;  . CARDIAC CATHETERIZATION  01/2015   [facility]: Ost LM 70%, p-m LAD 50-60% (Rest FFR + @ 0.75), mRCA 80-90%, ostD1 40%, pOM1 40%  . CATARACT EXTRACTION W/PHACO Left 01/18/2016   Procedure: CATARACT EXTRACTION PHACO AND INTRAOCULAR LENS PLACEMENT (IOC);  Surgeon: Eulogio Bear, MD;  Location: [facility];  Service: Ophthalmology;  Laterality: Left;  [place] 1.05 AP% 15.5 CDE 10.16 Fluid Pack Lot # [id number]  . CATARACT EXTRACTION W/PHACO Right 08/01/2016   Procedure: CATARACT EXTRACTION PHACO AND INTRAOCULAR LENS PLACEMENT (IOC);  Surgeon: Eulogio Bear, MD;  Location: [facility];  Service: Ophthalmology;  Laterality: Right;  [place] 01:00.6 AP% 11.4 CDE 6.93  LOT # [id number]  . COLONOSCOPY    . CORONARY ANGIOPLASTY     SENTS 02/12/16  .  CORONARY ARTERY BYPASS GRAFT  03/28/15    [facility]: Dr. Waldemar Dickens: LIMA to LAD, SVG to RCA, SVG to OM3, EVH  . DIALYSIS/PERMA CATHETER INSERTION N/A 02/11/2018   Procedure: DIALYSIS/PERMA CATHETER INSERTION;  Surgeon: Algernon Huxley, MD;  Location: [place] [facility];  Service: Cardiovascular;  Laterality: N/A;  . DIALYSIS/PERMA CATHETER REMOVAL Right 02/04/2018   Procedure: DIALYSIS/PERMA CATHETER REMOVAL;  Surgeon: Katha Cabal, MD;  Location: [place] [facility];  Service: Cardiovascular;  Laterality: Right;  . ESOPHAGOGASTRODUODENOSCOPY (EGD) WITH PROPOFOL N/A 10/24/2016   Procedure: ESOPHAGOGASTRODUODENOSCOPY (EGD) WITH PROPOFOL;  Surgeon: [name], MD;  Location: [facility];  Service: Endoscopy;  Laterality: N/A;  . EYE SURGERY Bilateral    cataract surgery  . EYE SURGERY     drains for glaucoma  . INSERTION EXPRESS TUBE SHUNT Right 08/01/2016   Procedure: INSERTION EXPRESS TUBE SHUNT;  Surgeon: Eulogio Bear, MD;  Location: [facility];  Service: Ophthalmology;  Laterality: Right;  . INSERTION OF AHMED VALVE Left 08/15/2016   Procedure: INSERTION OF AHMED VALVE;  Surgeon: Eulogio Bear, MD;  Location: [facility];  Service: Ophthalmology;  Laterality: Left;  . INSERTION OF DIALYSIS CATHETER    . LOWER EXTREMITY ANGIOGRAPHY Right 02/19/2018   Procedure: Lower Extremity Angiography;  Surgeon: Algernon Huxley, MD;  Location: [place] [facility];  Service: Cardiovascular;  Laterality: Right;  . LOWER EXTREMITY ANGIOGRAPHY Left 01/30/2018   Procedure: Lower Extremity Angiography;  Surgeon: Katha Cabal, MD;  Location: [place] [facility];  Service: Cardiovascular;  Laterality: Left;  . REMOVAL OF A DIALYSIS CATHETER N/A 02/25/2018   Procedure: REMOVAL OF A DIALYSIS CATHETER;  Surgeon: Katha Cabal, MD;  Location: [facility];  Service: Vascular;  Laterality: N/A;  . TEMPORARY DIALYSIS CATHETER N/A 02/06/2018   Procedure: TEMPORARY DIALYSIS CATHETER;  Surgeon: Delana Meyer,  Dolores Lory, MD;  Location: [place] [facility];  Service: Cardiovascular;  Laterality: N/A;  . TRANSTHORACIC ECHOCARDIOGRAM  January 2017    EF 60-65%. GR 2 DD. Mild degenerative mitral valve disease but no prolapse or regurgitation. Mild left atrial dilation. Mild to moderate LVH. Pericardial effusion gone  . UPPER EXTREMITY ANGIOGRAPHY Left 06/27/2016   Procedure: Upper Extremity Angiography;  Surgeon: Algernon Huxley, MD;  Location: [place] [facility];  Service: Cardiovascular;  Laterality: Left;  . UPPER EXTREMITY ANGIOGRAPHY Left 08/22/2016   Procedure: Upper Extremity Angiography;  Surgeon: Algernon Huxley, MD;  Location: [place] [facility];  Service: Cardiovascular;  Laterality: Left;  Marland Kitchen VASCULAR SURGERY    . WOUND DEBRIDEMENT Left 05/08/2018   Procedure: DEBRIDEMENT OF LEFT LEG BKA;  Surgeon: Algernon Huxley, MD;  Location: [facility];  Service: General;  Laterality: Left;   Family History  Problem Relation Age of Onset  . Diabetes Mellitus II Mother   . Heart failure Mother   . Pancreatic cancer Father    Allergies  Allergen Reactions  . Chlorthalidone Anaphylaxis, Itching and Rash  . Fentanyl Rash  . Midazolam Rash  . Ace Inhibitors Other (See Comments)    Reaction:  Hyperkalemia, agitation   . Angiotensin Receptor Blockers Other (See Comments)    Reaction:  Hyperkalemia, agitation   . Norvasc [Amlodipine] Itching and Rash  . Phenergan [Promethazine Hcl] Anxiety    "antsy, can't sit still"      Assessment & Plan:  Patient presents for her first post operative follow-up.  On May 08, 2018, the patient underwent an irrigation and debridement of left BKA stump of skin, soft tissue, and muscle for approximately 100cm2 with VAC placement.  The patient did miss her first postoperative follow-up which was scheduled approximately 2 weeks ago.  The patient presents today without complaint.  The patient notes that her nursing home has been providing VAC changes approximately 3 times a week.  The  patient feels that her left stump wound is healing well.  She denies any issues with her right stump.  She states that her right stump skin is healthy and intact.  Patient denies any foul odor, drainage or necrotic tissue to the left stump wound.  Patient denies any fever, nausea vomiting.  1. BKA stump complication ([place]) - Stable The patient's left stump wound is healing well. There is 100% granulation tissue noted to the wound bed. The patient should continue VAC therapy with changes every other day until the wound is completely healed. We will see the patient back in 1 month to surveilled her progress with VAC therapy and wound healing  2. Infection of amputation stump (HCC) - Stable As above  3. Type 2 diabetes mellitus with other specified complication, unspecified whether long term insulin use (HCC) - Stable Encouraged good control of the patient's blood glucose as this directly affects wound healing.  Current Outpatient Medications on File Prior to Visit  Medication Sig Dispense Refill  . acetaminophen (TYLENOL) 325 MG tablet Take 2 tablets (650 mg total) by mouth every 6 (six) hours as needed for mild pain (or Fever >/= 101).    Marland Kitchen apixaban (ELIQUIS) 2.5 MG TABS tablet Take 1 tablet (2.5 mg total) by mouth 2 (two) times daily. 60 tablet 0  . aspirin EC 81 MG tablet Take 1 tablet (81 mg total) by mouth daily.    Marland Kitchen atorvastatin (LIPITOR) 80 MG tablet Take 80 mg by mouth at bedtime.     Marland Kitchen  brimonidine (ALPHAGAN) 0.2 % ophthalmic solution Place 1 drop into both eyes 3 (three) times daily. 5 mL 12  . calcitRIOL (ROCALTROL) 0.5 MCG capsule Take 0.5 mcg by mouth daily.    . carvedilol (COREG) 6.25 MG tablet Take 1 tablet (6.25 mg total) by mouth 2 (two) times daily.    Marland Kitchen docusate sodium (COLACE) 100 MG capsule Take 1 capsule (100 mg total) by mouth 2 (two) times daily. 10 capsule 0  . epoetin alfa (EPOGEN,PROCRIT) 62836 UNIT/ML injection Inject 1 mL (10,000 Units total) into the vein Every  Tuesday,Thursday,and Saturday with dialysis. 1 mL   . famotidine (PEPCID) 20 MG tablet Take 20 mg by mouth 2 (two) times daily.    . folic acid (FOLVITE) 1 MG tablet Take 1 tablet (1 mg total) by mouth daily. 30 tablet 3  . isosorbide mononitrate (IMDUR) 30 MG 24 hr tablet Take 30 mg by mouth daily.     Marland Kitchen levETIRAcetam (KEPPRA) 250 MG tablet Three times weekly after dialysis    . levETIRAcetam (KEPPRA) 500 MG tablet Take 1 tablet (500 mg total) by mouth 2 (two) times daily.    . metroNIDAZOLE (FLAGYL) 500 MG tablet Take 1 tablet (500 mg total) by mouth every 12 (twelve) hours. 19 tablet 0  . midodrine (PROAMATINE) 10 MG tablet Take 1 tablet (10 mg total) by mouth 3 (three) times a week. (Patient taking differently: Take 10 mg by mouth 3 (three) times a week. Take Tuesday, Thursday, and Saturday) 30 tablet 0  . mirtazapine (REMERON) 7.5 MG tablet Take by mouth.    . Multiple Vitamins-Minerals (PRORENAL + D) TABS Take 1 tablet by mouth daily.    . multivitamin (RENA-VIT) TABS tablet Take 1 tablet by mouth at bedtime. 30 tablet 0  . nitroGLYCERIN (NITROSTAT) 0.4 MG SL tablet Place 1 tablet (0.4 mg total) under the tongue every 5 (five) minutes as needed for chest pain. 30 tablet 0  . Nutritional Supplements (FEEDING SUPPLEMENT, NEPRO CARB STEADY,) LIQD Take 237 mLs by mouth 2 (two) times daily between meals. 60 Can 0  . Omega-3 1000 MG CAPS Take 2 capsules by mouth daily.     . ondansetron (ZOFRAN) 4 MG tablet Take 4 mg by mouth every 8 (eight) hours as needed for nausea or vomiting.    Vladimir Faster Glycol-Propyl Glycol 0.4-0.3 % SOLN Place 1 drop into both eyes 2 (two) times daily.    Marland Kitchen rOPINIRole (REQUIP) 0.25 MG tablet Take 1 tablet (0.25 mg total) by mouth at bedtime.    . sevelamer carbonate (RENVELA) 800 MG tablet Take 800 mg by mouth 3 (three) times daily with meals.    . timolol (TIMOPTIC) 0.5 % ophthalmic solution Place 1 drop into both eyes 3 (three) times daily. 10 mL 12  . traMADol  (ULTRAM) 50 MG tablet Take 1 tablet (50 mg total) by mouth every 8 (eight) hours as needed for moderate pain or severe pain. 15 tablet 0  . valsartan (DIOVAN) 80 MG tablet Take by mouth.    . vitamin B-12 (CYANOCOBALAMIN) 1000 MCG tablet Take 1 tablet (1,000 mcg total) by mouth daily. 30 tablet 0  . vitamin C (VITAMIN C) 500 MG tablet Take 1 tablet (500 mg total) by mouth 2 (two) times daily. 60 tablet 0  . ciprofloxacin (CIPRO) 500 MG tablet Take 1 tablet (500 mg total) by mouth at bedtime. (Patient not taking: Reported on 05/21/2018) 10 tablet 0   No current facility-administered medications on file prior to visit.  There are no Patient Instructions on file for this visit. No follow-ups on file.  Ayris Carano A Cederick Broadnax, PA-C

## 2018-06-09 ENCOUNTER — Telehealth (INDEPENDENT_AMBULATORY_CARE_PROVIDER_SITE_OTHER): Payer: Self-pay

## 2018-06-09 NOTE — Telephone Encounter (Signed)
Spoke with [name] at [facility] and the patient is scheduled for 06/10/2018 with Dr. Delana Meyer for a permcath exchange with a 1:45 pm arrival time.

## 2018-06-10 ENCOUNTER — Other Ambulatory Visit (INDEPENDENT_AMBULATORY_CARE_PROVIDER_SITE_OTHER): Payer: Self-pay | Admitting: Vascular Surgery

## 2018-06-10 ENCOUNTER — Ambulatory Visit
Admission: RE | Admit: 2018-06-10 | Discharge: 2018-06-10 | Disposition: A | Discharge status: Home / Self Care | Payer: Medicare Other | Attending: Vascular Surgery | Admitting: Vascular Surgery

## 2018-06-10 ENCOUNTER — Encounter
Admission: RE | Disposition: A | Discharge status: Home / Self Care | Payer: Self-pay | Source: Home / Self Care | Attending: Vascular Surgery

## 2018-06-10 ENCOUNTER — Other Ambulatory Visit: Payer: Self-pay

## 2018-06-10 DIAGNOSIS — Z538 Procedure and treatment not carried out for other reasons: Secondary | ICD-10-CM | POA: Diagnosis not present

## 2018-06-10 DIAGNOSIS — N186 End stage renal disease: Secondary | ICD-10-CM | POA: Insufficient documentation

## 2018-06-10 LAB — POTASSIUM (ARMC VASCULAR LAB ONLY): Potassium (ARMC vascular lab): 4 (ref 3.5–5.1)

## 2018-06-10 LAB — GLUCOSE, CAPILLARY: Glucose-Capillary: 198 mg/dL — ABNORMAL HIGH (ref 70–99)

## 2018-06-10 SURGERY — DIALYSIS/PERMA CATHETER INSERTION
Anesthesia: Moderate Sedation

## 2018-06-10 MED ORDER — ONDANSETRON HCL 4 MG/2ML IJ SOLN: 4.0000 mg | Freq: Four times a day (QID) | INTRAMUSCULAR | Status: DC | PRN

## 2018-06-10 MED ORDER — FAMOTIDINE 20 MG PO TABS: 40.0000 mg | ORAL_TABLET | ORAL | Status: DC | PRN

## 2018-06-10 MED ORDER — HYDROMORPHONE HCL 1 MG/ML IJ SOLN: 1.0000 mg | Freq: Once | INTRAMUSCULAR | Status: DC | PRN

## 2018-06-10 MED ORDER — SODIUM CHLORIDE 0.9 % IV SOLN
INTRAVENOUS | Status: DC
  Administered 2018-06-10: 1000 mL via INTRAVENOUS

## 2018-06-10 MED ORDER — METHYLPREDNISOLONE SODIUM SUCC 125 MG IJ SOLR: 125.0000 mg | INTRAMUSCULAR | Status: DC | PRN

## 2018-06-10 MED ORDER — CEFAZOLIN SODIUM-DEXTROSE 1-4 GM/50ML-% IV SOLN: 1.0000 g | Freq: Once | INTRAVENOUS | Status: DC

## 2018-06-10 MED ORDER — DIPHENHYDRAMINE HCL 50 MG/ML IJ SOLN: 50.0000 mg | Freq: Once | INTRAMUSCULAR | Status: DC

## 2018-06-10 MED ORDER — MIDAZOLAM HCL 2 MG/ML PO SYRP: 8.0000 mg | ORAL_SOLUTION | Freq: Once | ORAL | Status: DC | PRN

## 2018-06-17 ENCOUNTER — Telehealth (INDEPENDENT_AMBULATORY_CARE_PROVIDER_SITE_OTHER): Payer: Self-pay | Admitting: Vascular Surgery

## 2018-06-17 NOTE — Telephone Encounter (Signed)
Kelly from [facility] called asking for a different order than the wound vac. She says patient pulls off vac constantly and pulls off any kind of dressing that she puts on. Says they have had to put wound vac back on 5 times in the last 2 days.  Please advise. AS, CMA

## 2018-06-18 NOTE — Telephone Encounter (Signed)
Left message for Stephanie Ellis to call me back.  Her number is [phone]

## 2018-06-18 NOTE — Telephone Encounter (Signed)
April spoke with Silva Bandy from [facility], She is aware of Dews advise below. AS, CMA

## 2018-07-08 ENCOUNTER — Inpatient Hospital Stay: Payer: Medicare Other

## 2018-07-08 ENCOUNTER — Encounter: Payer: Self-pay | Admitting: Pulmonary Disease

## 2018-07-08 ENCOUNTER — Inpatient Hospital Stay: Admit: 2018-07-08 | Payer: Medicare Other

## 2018-07-08 ENCOUNTER — Emergency Department: Payer: Medicare Other

## 2018-07-08 ENCOUNTER — Other Ambulatory Visit: Payer: Medicare Other

## 2018-07-08 ENCOUNTER — Ambulatory Visit (INDEPENDENT_AMBULATORY_CARE_PROVIDER_SITE_OTHER): Payer: Medicare Other | Admitting: Vascular Surgery

## 2018-07-08 ENCOUNTER — Inpatient Hospital Stay
Admission: EM | Admit: 2018-07-08 | Discharge: 2018-07-13 | DRG: 100 | Disposition: A | Discharge status: Skilled Nursing Facility | Payer: Medicare Other | Source: Ambulatory Visit | Attending: Internal Medicine | Admitting: Internal Medicine

## 2018-07-08 ENCOUNTER — Encounter: Payer: Self-pay | Admitting: Internal Medicine

## 2018-07-08 DIAGNOSIS — E875 Hyperkalemia: Secondary | ICD-10-CM | POA: Diagnosis not present

## 2018-07-08 DIAGNOSIS — G9341 Metabolic encephalopathy: Secondary | ICD-10-CM | POA: Diagnosis present

## 2018-07-08 DIAGNOSIS — R4182 Altered mental status, unspecified: Secondary | ICD-10-CM | POA: Diagnosis present

## 2018-07-08 DIAGNOSIS — I252 Old myocardial infarction: Secondary | ICD-10-CM | POA: Diagnosis not present

## 2018-07-08 DIAGNOSIS — Z89511 Acquired absence of right leg below knee: Secondary | ICD-10-CM

## 2018-07-08 DIAGNOSIS — E1151 Type 2 diabetes mellitus with diabetic peripheral angiopathy without gangrene: Secondary | ICD-10-CM | POA: Diagnosis present

## 2018-07-08 DIAGNOSIS — I2510 Atherosclerotic heart disease of native coronary artery without angina pectoris: Secondary | ICD-10-CM | POA: Diagnosis present

## 2018-07-08 DIAGNOSIS — B965 Pseudomonas (aeruginosa) (mallei) (pseudomallei) as the cause of diseases classified elsewhere: Secondary | ICD-10-CM | POA: Diagnosis present

## 2018-07-08 DIAGNOSIS — Z79899 Other long term (current) drug therapy: Secondary | ICD-10-CM

## 2018-07-08 DIAGNOSIS — Z8249 Family history of ischemic heart disease and other diseases of the circulatory system: Secondary | ICD-10-CM

## 2018-07-08 DIAGNOSIS — G40909 Epilepsy, unspecified, not intractable, without status epilepticus: Secondary | ICD-10-CM | POA: Diagnosis present

## 2018-07-08 DIAGNOSIS — N2581 Secondary hyperparathyroidism of renal origin: Secondary | ICD-10-CM | POA: Diagnosis present

## 2018-07-08 DIAGNOSIS — I639 Cerebral infarction, unspecified: Secondary | ICD-10-CM | POA: Diagnosis not present

## 2018-07-08 DIAGNOSIS — Z992 Dependence on renal dialysis: Secondary | ICD-10-CM

## 2018-07-08 DIAGNOSIS — I081 Rheumatic disorders of both mitral and tricuspid valves: Secondary | ICD-10-CM | POA: Diagnosis present

## 2018-07-08 DIAGNOSIS — R7881 Bacteremia: Secondary | ICD-10-CM | POA: Diagnosis present

## 2018-07-08 DIAGNOSIS — D631 Anemia in chronic kidney disease: Secondary | ICD-10-CM | POA: Diagnosis present

## 2018-07-08 DIAGNOSIS — L089 Local infection of the skin and subcutaneous tissue, unspecified: Secondary | ICD-10-CM | POA: Diagnosis not present

## 2018-07-08 DIAGNOSIS — R4701 Aphasia: Secondary | ICD-10-CM | POA: Diagnosis present

## 2018-07-08 DIAGNOSIS — Z79891 Long term (current) use of opiate analgesic: Secondary | ICD-10-CM

## 2018-07-08 DIAGNOSIS — I160 Hypertensive urgency: Secondary | ICD-10-CM | POA: Diagnosis not present

## 2018-07-08 DIAGNOSIS — Z90710 Acquired absence of both cervix and uterus: Secondary | ICD-10-CM

## 2018-07-08 DIAGNOSIS — Y835 Amputation of limb(s) as the cause of abnormal reaction of the patient, or of later complication, without mention of misadventure at the time of the procedure: Secondary | ICD-10-CM | POA: Diagnosis present

## 2018-07-08 DIAGNOSIS — T148XXA Other injury of unspecified body region, initial encounter: Secondary | ICD-10-CM

## 2018-07-08 DIAGNOSIS — Z7901 Long term (current) use of anticoagulants: Secondary | ICD-10-CM

## 2018-07-08 DIAGNOSIS — I5042 Chronic combined systolic (congestive) and diastolic (congestive) heart failure: Secondary | ICD-10-CM | POA: Diagnosis present

## 2018-07-08 DIAGNOSIS — E7800 Pure hypercholesterolemia, unspecified: Secondary | ICD-10-CM | POA: Diagnosis present

## 2018-07-08 DIAGNOSIS — I132 Hypertensive heart and chronic kidney disease with heart failure and with stage 5 chronic kidney disease, or end stage renal disease: Secondary | ICD-10-CM | POA: Diagnosis present

## 2018-07-08 DIAGNOSIS — I7789 Other specified disorders of arteries and arterioles: Secondary | ICD-10-CM | POA: Diagnosis not present

## 2018-07-08 DIAGNOSIS — I4891 Unspecified atrial fibrillation: Secondary | ICD-10-CM | POA: Diagnosis present

## 2018-07-08 DIAGNOSIS — I340 Nonrheumatic mitral (valve) insufficiency: Secondary | ICD-10-CM | POA: Diagnosis not present

## 2018-07-08 DIAGNOSIS — T8744 Infection of amputation stump, left lower extremity: Secondary | ICD-10-CM | POA: Diagnosis present

## 2018-07-08 DIAGNOSIS — Z89512 Acquired absence of left leg below knee: Secondary | ICD-10-CM | POA: Diagnosis not present

## 2018-07-08 DIAGNOSIS — E119 Type 2 diabetes mellitus without complications: Secondary | ICD-10-CM | POA: Diagnosis not present

## 2018-07-08 DIAGNOSIS — Z7982 Long term (current) use of aspirin: Secondary | ICD-10-CM

## 2018-07-08 DIAGNOSIS — N186 End stage renal disease: Secondary | ICD-10-CM | POA: Diagnosis present

## 2018-07-08 DIAGNOSIS — K219 Gastro-esophageal reflux disease without esophagitis: Secondary | ICD-10-CM | POA: Diagnosis present

## 2018-07-08 DIAGNOSIS — I959 Hypotension, unspecified: Secondary | ICD-10-CM | POA: Diagnosis present

## 2018-07-08 DIAGNOSIS — Z888 Allergy status to other drugs, medicaments and biological substances status: Secondary | ICD-10-CM

## 2018-07-08 DIAGNOSIS — E1122 Type 2 diabetes mellitus with diabetic chronic kidney disease: Secondary | ICD-10-CM | POA: Diagnosis present

## 2018-07-08 DIAGNOSIS — Z794 Long term (current) use of insulin: Secondary | ICD-10-CM

## 2018-07-08 DIAGNOSIS — I10 Essential (primary) hypertension: Secondary | ICD-10-CM

## 2018-07-08 DIAGNOSIS — Z9861 Coronary angioplasty status: Secondary | ICD-10-CM

## 2018-07-08 DIAGNOSIS — Z833 Family history of diabetes mellitus: Secondary | ICD-10-CM

## 2018-07-08 DIAGNOSIS — Z951 Presence of aortocoronary bypass graft: Secondary | ICD-10-CM

## 2018-07-08 DIAGNOSIS — E785 Hyperlipidemia, unspecified: Secondary | ICD-10-CM | POA: Diagnosis present

## 2018-07-08 DIAGNOSIS — Z8679 Personal history of other diseases of the circulatory system: Secondary | ICD-10-CM

## 2018-07-08 DIAGNOSIS — Z800 Family history of malignant neoplasm of digestive organs: Secondary | ICD-10-CM

## 2018-07-08 LAB — MRSA PCR SCREENING: MRSA by PCR: POSITIVE — AB

## 2018-07-08 LAB — DIFFERENTIAL
Abs Immature Granulocytes: 0.02 10*3/uL (ref 0.00–0.07)
Basophils Absolute: 0 10*3/uL (ref 0.0–0.1)
Basophils Relative: 1 %
Eosinophils Absolute: 0.2 10*3/uL (ref 0.0–0.5)
Eosinophils Relative: 3 %
Immature Granulocytes: 0 %
Lymphocytes Relative: 11 %
Lymphs Abs: 1 10*3/uL (ref 0.7–4.0)
Monocytes Absolute: 0.6 10*3/uL (ref 0.1–1.0)
Monocytes Relative: 8 %
Neutro Abs: 6.5 10*3/uL (ref 1.7–7.7)
Neutrophils Relative %: 77 %

## 2018-07-08 LAB — HEMOGLOBIN A1C
Hgb A1c MFr Bld: 7.1 % — ABNORMAL HIGH (ref 4.8–5.6)
Mean Plasma Glucose: 157.07 mg/dL

## 2018-07-08 LAB — COMPREHENSIVE METABOLIC PANEL
ALT: 17 U/L (ref 0–44)
AST: 22 U/L (ref 15–41)
Albumin: 3.5 g/dL (ref 3.5–5.0)
Alkaline Phosphatase: 85 U/L (ref 38–126)
Anion gap: 17 — ABNORMAL HIGH (ref 5–15)
BUN: 43 mg/dL — ABNORMAL HIGH (ref 8–23)
CO2: 23 mmol/L (ref 22–32)
Calcium: 8 mg/dL — ABNORMAL LOW (ref 8.9–10.3)
Chloride: 97 mmol/L — ABNORMAL LOW (ref 98–111)
Creatinine, Ser: 6 mg/dL — ABNORMAL HIGH (ref 0.44–1.00)
GFR calc Af Amer: 8 mL/min — ABNORMAL LOW (ref >60)
GFR calc non Af Amer: 7 mL/min — ABNORMAL LOW (ref >60)
Glucose, Bld: 126 mg/dL — ABNORMAL HIGH (ref 70–99)
Potassium: 4.4 mmol/L (ref 3.5–5.1)
Sodium: 137 mmol/L (ref 135–145)
Total Bilirubin: 0.4 mg/dL (ref 0.3–1.2)
Total Protein: 7.2 g/dL (ref 6.5–8.1)

## 2018-07-08 LAB — CBC
HCT: 47 % — ABNORMAL HIGH (ref 36.0–46.0)
Hemoglobin: 14.1 g/dL (ref 12.0–15.0)
MCH: 28.5 pg (ref 26.0–34.0)
MCHC: 30 g/dL (ref 30.0–36.0)
MCV: 95.1 fL (ref 80.0–100.0)
Platelets: 127 10*3/uL — ABNORMAL LOW (ref 150–400)
RBC: 4.94 MIL/uL (ref 3.87–5.11)
RDW: 22.1 % — ABNORMAL HIGH (ref 11.5–15.5)
WBC: 8.4 10*3/uL (ref 4.0–10.5)
nRBC: 0.5 % — ABNORMAL HIGH (ref 0.0–0.2)

## 2018-07-08 LAB — LIPID PANEL
Cholesterol: 153 mg/dL (ref 0–200)
HDL: 35 mg/dL — ABNORMAL LOW (ref >40)
LDL Cholesterol: 55 mg/dL (ref 0–99)
Total CHOL/HDL Ratio: 4.4 RATIO
Triglycerides: 313 mg/dL — ABNORMAL HIGH (ref <150)
VLDL: 63 mg/dL — ABNORMAL HIGH (ref 0–40)

## 2018-07-08 LAB — TROPONIN I
Troponin I: 0.09 ng/mL (ref <0.03)
Troponin I: 0.11 ng/mL (ref <0.03)

## 2018-07-08 LAB — PROTIME-INR
INR: 1.2 (ref 0.8–1.2)
Prothrombin Time: 15.3 seconds — ABNORMAL HIGH (ref 11.4–15.2)

## 2018-07-08 LAB — GLUCOSE, CAPILLARY
Glucose-Capillary: 97 mg/dL (ref 70–99)
Glucose-Capillary: 108 mg/dL — ABNORMAL HIGH (ref 70–99)

## 2018-07-08 LAB — ETHANOL: Alcohol, Ethyl (B): <10 mg/dL (ref <10)

## 2018-07-08 LAB — APTT: aPTT: 35 seconds (ref 24–36)

## 2018-07-08 MED ORDER — SODIUM CHLORIDE 0.9 % IV SOLN: 50.0000 mL | Freq: Once | INTRAVENOUS | Status: DC

## 2018-07-08 MED ORDER — DEXMEDETOMIDINE HCL IN NACL 400 MCG/100ML IV SOLN: 0.4000 ug/kg/h | INTRAVENOUS | Status: DC

## 2018-07-08 MED ORDER — VANCOMYCIN HCL IN DEXTROSE 500-5 MG/100ML-% IV SOLN: 500.0000 mg | INTRAVENOUS | Status: DC

## 2018-07-08 MED ORDER — LABETALOL HCL 5 MG/ML IV SOLN
20.0000 mg | Freq: Once | INTRAVENOUS | Status: AC
  Administered 2018-07-08: 20 mg via INTRAVENOUS

## 2018-07-08 MED ORDER — SEVELAMER CARBONATE 800 MG PO TABS
800.0000 mg | ORAL_TABLET | Freq: Three times a day (TID) | ORAL | Status: DC
  Administered 2018-07-09 – 2018-07-11 (×4): 800 mg via ORAL
  Filled 2018-07-08 (×7): qty 1

## 2018-07-08 MED ORDER — FAMOTIDINE 20 MG PO TABS: 20.0000 mg | ORAL_TABLET | Freq: Two times a day (BID) | ORAL | Status: DC

## 2018-07-08 MED ORDER — SODIUM CHLORIDE 0.9 % IV SOLN
INTRAVENOUS | Status: DC | PRN
  Administered 2018-07-08 – 2018-07-09 (×2): via INTRAVENOUS

## 2018-07-08 MED ORDER — BRIMONIDINE TARTRATE 0.2 % OP SOLN
1.0000 [drp] | Freq: Three times a day (TID) | OPHTHALMIC | Status: DC
  Administered 2018-07-08 – 2018-07-13 (×16): 1 [drp] via OPHTHALMIC
  Filled 2018-07-08: qty 5

## 2018-07-08 MED ORDER — NICARDIPINE HCL IN NACL 20-0.86 MG/200ML-% IV SOLN
INTRAVENOUS | Status: AC
  Filled 2018-07-08: qty 200

## 2018-07-08 MED ORDER — ALTEPLASE 100 MG IV SOLR
INTRAVENOUS | Status: AC
  Filled 2018-07-08: qty 100

## 2018-07-08 MED ORDER — ACETAMINOPHEN 325 MG PO TABS
650.0000 mg | ORAL_TABLET | Freq: Four times a day (QID) | ORAL | Status: DC | PRN
  Administered 2018-07-10 – 2018-07-12 (×2): 650 mg via ORAL
  Filled 2018-07-08 (×2): qty 2

## 2018-07-08 MED ORDER — VANCOMYCIN HCL IN DEXTROSE 1-5 GM/200ML-% IV SOLN: 1000.0000 mg | Freq: Once | INTRAVENOUS | Status: DC

## 2018-07-08 MED ORDER — ATORVASTATIN CALCIUM 20 MG PO TABS
80.0000 mg | ORAL_TABLET | Freq: Every day | ORAL | Status: DC
  Administered 2018-07-09 – 2018-07-12 (×4): 80 mg via ORAL
  Filled 2018-07-08 (×4): qty 4

## 2018-07-08 MED ORDER — LABETALOL HCL 5 MG/ML IV SOLN
INTRAVENOUS | Status: AC
  Administered 2018-07-08: 20 mg via INTRAVENOUS
  Filled 2018-07-08: qty 4

## 2018-07-08 MED ORDER — SODIUM CHLORIDE 0.9 % IV SOLN
750.0000 mg | Freq: Once | INTRAVENOUS | Status: AC
  Administered 2018-07-08: 750 mg via INTRAVENOUS
  Filled 2018-07-08: qty 7.5

## 2018-07-08 MED ORDER — FAMOTIDINE IN NACL 20-0.9 MG/50ML-% IV SOLN
20.0000 mg | Freq: Every day | INTRAVENOUS | Status: DC
  Administered 2018-07-08 – 2018-07-10 (×3): 20 mg via INTRAVENOUS
  Filled 2018-07-08 (×3): qty 50

## 2018-07-08 MED ORDER — MIRTAZAPINE 15 MG PO TABS
7.5000 mg | ORAL_TABLET | Freq: Every day | ORAL | Status: DC
  Administered 2018-07-09 – 2018-07-12 (×4): 7.5 mg via ORAL
  Filled 2018-07-08 (×4): qty 1

## 2018-07-08 MED ORDER — HYDRALAZINE HCL 20 MG/ML IJ SOLN
10.0000 mg | INTRAMUSCULAR | Status: DC | PRN
  Administered 2018-07-10 (×2): 10 mg via INTRAVENOUS
  Filled 2018-07-08 (×2): qty 1

## 2018-07-08 MED ORDER — CLINDAMYCIN PHOSPHATE 600 MG/50ML IV SOLN: 600.0000 mg | Freq: Once | INTRAVENOUS | Status: DC

## 2018-07-08 MED ORDER — LEVETIRACETAM IN NACL 1000 MG/100ML IV SOLN
1000.0000 mg | Freq: Once | INTRAVENOUS | Status: DC
  Filled 2018-07-08: qty 100

## 2018-07-08 MED ORDER — ALTEPLASE (STROKE) FULL DOSE INFUSION
0.9000 mg/kg | Freq: Once | INTRAVENOUS | Status: DC
  Filled 2018-07-08: qty 100

## 2018-07-08 MED ORDER — LABETALOL HCL 5 MG/ML IV SOLN
10.0000 mg | INTRAVENOUS | Status: DC | PRN
  Administered 2018-07-08 – 2018-07-10 (×3): 10 mg via INTRAVENOUS
  Filled 2018-07-08 (×3): qty 4

## 2018-07-08 MED ORDER — POLYVINYL ALCOHOL 1.4 % OP SOLN
1.0000 [drp] | Freq: Two times a day (BID) | OPHTHALMIC | Status: DC
  Administered 2018-07-08 – 2018-07-13 (×10): 1 [drp] via OPHTHALMIC
  Filled 2018-07-08: qty 15

## 2018-07-08 MED ORDER — LEVETIRACETAM 500 MG PO TABS
500.0000 mg | ORAL_TABLET | Freq: Two times a day (BID) | ORAL | Status: DC
  Filled 2018-07-08: qty 1

## 2018-07-08 MED ORDER — MUPIROCIN 2 % EX OINT
1.0000 "application " | TOPICAL_OINTMENT | Freq: Two times a day (BID) | CUTANEOUS | Status: DC
  Administered 2018-07-08 – 2018-07-13 (×9): 1 via NASAL
  Filled 2018-07-08: qty 22

## 2018-07-08 MED ORDER — TIMOLOL MALEATE 0.5 % OP SOLN
1.0000 [drp] | Freq: Three times a day (TID) | OPHTHALMIC | Status: DC
  Administered 2018-07-08 – 2018-07-13 (×16): 1 [drp] via OPHTHALMIC
  Filled 2018-07-08: qty 5

## 2018-07-08 MED ORDER — VANCOMYCIN HCL 10 G IV SOLR
1250.0000 mg | Freq: Once | INTRAVENOUS | Status: DC
  Filled 2018-07-08: qty 1250

## 2018-07-08 MED ORDER — CIPROFLOXACIN IN D5W 400 MG/200ML IV SOLN: 400.0000 mg | INTRAVENOUS | Status: DC

## 2018-07-08 MED ORDER — CHLORHEXIDINE GLUCONATE CLOTH 2 % EX PADS
6.0000 | MEDICATED_PAD | Freq: Every day | CUTANEOUS | Status: DC
  Administered 2018-07-09 – 2018-07-12 (×4): 6 via TOPICAL

## 2018-07-08 MED ORDER — LEVETIRACETAM IN NACL 500 MG/100ML IV SOLN
500.0000 mg | Freq: Two times a day (BID) | INTRAVENOUS | Status: AC
  Administered 2018-07-09: 500 mg via INTRAVENOUS
  Filled 2018-07-08 (×3): qty 100

## 2018-07-08 MED ORDER — CALCITRIOL 0.25 MCG PO CAPS
0.5000 ug | ORAL_CAPSULE | Freq: Every day | ORAL | Status: DC
  Administered 2018-07-10 – 2018-07-13 (×4): 0.5 ug via ORAL
  Filled 2018-07-08 (×5): qty 2

## 2018-07-08 MED ORDER — SODIUM CHLORIDE 0.9 % IV SOLN: 1.0000 g | INTRAVENOUS | Status: DC

## 2018-07-08 NOTE — ED Notes (Signed)
tPA stopped per Dr. Dorcas Mcmurray NP spoke to facility and was told pt received epixaban this AM

## 2018-07-08 NOTE — ED Notes (Addendum)
Per Benjamine Mola NP, no second IV at this time

## 2018-07-08 NOTE — ED Notes (Signed)
Attempted report to ICU. Placed on hold for 10 minutes. Attempting to recall at this time

## 2018-07-08 NOTE — H&P (Signed)
[place] [facility] [place] NAME: Stephanie Ellis    MR#:  [id number]  DATE OF BIRTH:  09/05/1950  DATE OF ADMISSION:  07/08/2018  PRIMARY CARE PHYSICIAN: Kirk Ruths, MD   REQUESTING/REFERRING PHYSICIAN: Dr Aundria Rud  CHIEF COMPLAINT:   Chief Complaint  Patient presents with  . Altered Mental Status    HISTORY OF PRESENT ILLNESS:  Stephanie Ellis  is a 68 y.o. female sent in for altered mental status and was found to be aphasic.  Apparently the patient lost responsiveness over at dialysis.  Blood pressure was very high on presentation.  Code stroke was called.  TPA was running when I saw the patient and was almost through.  Patient able to shake her head to some questions.  Patient moving her left leg very well.  She did not follow commands on moving her other extremities until I was able to move them for her and then she was able to put them down nice and easy.  PAST MEDICAL HISTORY:   Past Medical History:  Diagnosis Date  . (HFpEF) heart failure with preserved ejection fraction ([place])    a. 01/2018 Echo: EF 50-55%, no rwma, Gr1 DD, triv AI, mild MR. Midly dil LA/RV, mod reduced RV fxn. Irreg thickening of TV w/ mobile echodensity that appears to arise from valve.  . Anemia   . Anorexia   . Bacteremia due to Pseudomonas   . Carotid arterial disease ([place])    a. 01/2017 Carotid U/S: RICA <10, LICA <93.  Marland Kitchen Complication of anesthesia    a. Pt reports h/o complication on 9 different occasions - ? hypotension/arrest.  . 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 01/2015 Cath [facility]): 70% LM, p-mLAD 50-60% (Resting FFR 0.75), mRCA 80-90%, ~40 Ost OM & D1-->CABG; b. 01/2016 Staged PCI of LCX x 2 and RCA.  Marland Kitchen ESRD (end stage renal disease) on dialysis ([place])    a. ESRD secondary to acute kidney failure s/p CABG-->PD  . Essential hypertension   . GERD (gastroesophageal reflux disease)   . Heart murmur    . Hypercholesterolemia   . Myocardial infarction ([place]) 2016  . PVD (peripheral vascular disease) ([place])    a. 01/30/2018 PV Angio: Sev Left Popliteal dzs s/p PTA w/ 106m lutonix DEB w/ mech aspiration of the L popliteal, L AT, and Left tibioperoneal trunck and peroneal artery.  . 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  [facility]: Dr. BMarland KitchenHaithcock: CABG x 3, LIMA to LAD, SVG to RCA, SVG to OM3, EVH  . Sinusitis 2019  . Type II diabetes mellitus with complication (HCC)    CAD    PAST SURGICAL HISTORY:   Past Surgical History:  Procedure Laterality Date  . ABDOMINAL HYSTERECTOMY    . AMPUTATION Right 02/25/2018   Procedure: AMPUTATION BELOW KNEE;  Surgeon: SKatha Cabal MD;  Location: [facility]S;  Service: Vascular;  Laterality: Right;  . AMPUTATION Left 03/11/2018   Procedure: AMPUTATION BELOW KNEE;  Surgeon: DAlgernon Huxley MD;  Location: [facility];  Service: Vascular;  Laterality: Left;  . AMPUTATION Bilateral 04/13/2018   Procedure: REVISION OF BILATERAL AMPUTATIONS BELOW KNEE WITH WOUND VAC APPLICATIONS;  Surgeon: EEvaristo Bury MD;  Location: [facility];  Service: Vascular;  Laterality: Bilateral;  . AMPUTATION TOE Left 02/06/2018   Procedure: AMPUTATION GREAT TOE;  Surgeon: FSamara Deist DPM;  Location: [facility];  Service: Podiatry;  Laterality: Left;  .  APPLICATION OF WOUND VAC Left 04/13/2018   Procedure: APPLICATION OF WOUND VAC;  Surgeon: Evaristo Bury, MD;  Location: [facility];  Service: Vascular;  Laterality: Left;  . ARTERIOVENOUS GRAFT PLACEMENT  05/2016  . AV FISTULA PLACEMENT Left 05/30/2016   Procedure: ARTERIOVENOUS graft;  Surgeon: Algernon Huxley, MD;  Location: [facility];  Service: Vascular;  Laterality: Left;  . CAPD INSERTION N/A 10/30/2017   Procedure: LAPAROSCOPIC INSERTION CONTINUOUS AMBULATORY PERITONEAL DIALYSIS  (CAPD) CATHETER;  Surgeon: Algernon Huxley, MD;  Location: [facility];  Service: Vascular;  Laterality: N/A;  . CARDIAC CATHETERIZATION  01/2015   [facility]: Ost LM 70%,  p-m LAD 50-60% (Rest FFR + @ 0.75), mRCA 80-90%, ostD1 40%, pOM1 40%  . CATARACT EXTRACTION W/PHACO Left 01/18/2016   Procedure: CATARACT EXTRACTION PHACO AND INTRAOCULAR LENS PLACEMENT (IOC);  Surgeon: Eulogio Bear, MD;  Location: [facility];  Service: Ophthalmology;  Laterality: Left;  [place] 1.05 AP% 15.5 CDE 10.16 Fluid Pack Lot # [id number]  . CATARACT EXTRACTION W/PHACO Right 08/01/2016   Procedure: CATARACT EXTRACTION PHACO AND INTRAOCULAR LENS PLACEMENT (IOC);  Surgeon: Eulogio Bear, MD;  Location: [facility];  Service: Ophthalmology;  Laterality: Right;  [place] 01:00.6 AP% 11.4 CDE 6.93  LOT # [id number]  . COLONOSCOPY    . CORONARY ANGIOPLASTY     SENTS 02/12/16  . CORONARY ARTERY BYPASS GRAFT  03/28/15    [facility]: Dr. Waldemar Dickens: LIMA to LAD, SVG to RCA, SVG to OM3, EVH  . DIALYSIS/PERMA CATHETER INSERTION N/A 02/11/2018   Procedure: DIALYSIS/PERMA CATHETER INSERTION;  Surgeon: Algernon Huxley, MD;  Location: [place] [facility];  Service: Cardiovascular;  Laterality: N/A;  . DIALYSIS/PERMA CATHETER REMOVAL Right 02/04/2018   Procedure: DIALYSIS/PERMA CATHETER REMOVAL;  Surgeon: Katha Cabal, MD;  Location: [place] [facility];  Service: Cardiovascular;  Laterality: Right;  . ESOPHAGOGASTRODUODENOSCOPY (EGD) WITH PROPOFOL N/A 10/24/2016   Procedure: ESOPHAGOGASTRODUODENOSCOPY (EGD) WITH PROPOFOL;  Surgeon: [name], MD;  Location: [facility];  Service: Endoscopy;  Laterality: N/A;  . EYE SURGERY Bilateral    cataract surgery  . EYE SURGERY     drains for glaucoma  . INSERTION EXPRESS TUBE SHUNT Right 08/01/2016   Procedure: INSERTION EXPRESS TUBE SHUNT;  Surgeon: Eulogio Bear, MD;  Location: [facility];  Service: Ophthalmology;  Laterality: Right;  . INSERTION OF AHMED VALVE Left 08/15/2016   Procedure: INSERTION OF AHMED VALVE;  Surgeon: Eulogio Bear, MD;  Location: [facility];  Service: Ophthalmology;  Laterality: Left;  . INSERTION OF DIALYSIS CATHETER    . LOWER  EXTREMITY ANGIOGRAPHY Right 02/19/2018   Procedure: Lower Extremity Angiography;  Surgeon: Algernon Huxley, MD;  Location: [place] [facility];  Service: Cardiovascular;  Laterality: Right;  . LOWER EXTREMITY ANGIOGRAPHY Left 01/30/2018   Procedure: Lower Extremity Angiography;  Surgeon: Katha Cabal, MD;  Location: [place] [facility];  Service: Cardiovascular;  Laterality: Left;  . REMOVAL OF A DIALYSIS CATHETER N/A 02/25/2018   Procedure: REMOVAL OF A DIALYSIS CATHETER;  Surgeon: Katha Cabal, MD;  Location: [facility];  Service: Vascular;  Laterality: N/A;  . TEMPORARY DIALYSIS CATHETER N/A 02/06/2018   Procedure: TEMPORARY DIALYSIS CATHETER;  Surgeon: Katha Cabal, MD;  Location: [place] [facility];  Service: Cardiovascular;  Laterality: N/A;  . TRANSTHORACIC ECHOCARDIOGRAM  January 2017    EF 60-65%. GR 2 DD. Mild degenerative mitral valve disease but no prolapse or regurgitation. Mild left atrial dilation. Mild to moderate LVH. Pericardial effusion  gone  . UPPER EXTREMITY ANGIOGRAPHY Left 06/27/2016   Procedure: Upper Extremity Angiography;  Surgeon: Algernon Huxley, MD;  Location: [place] [facility];  Service: Cardiovascular;  Laterality: Left;  . UPPER EXTREMITY ANGIOGRAPHY Left 08/22/2016   Procedure: Upper Extremity Angiography;  Surgeon: Algernon Huxley, MD;  Location: [place] [facility];  Service: Cardiovascular;  Laterality: Left;  Marland Kitchen VASCULAR SURGERY    . WOUND DEBRIDEMENT Left 05/08/2018   Procedure: DEBRIDEMENT OF LEFT LEG BKA;  Surgeon: Algernon Huxley, MD;  Location: [facility];  Service: General;  Laterality: Left;    SOCIAL HISTORY:   Social History   Tobacco Use  . Smoking status: Never Smoker  . Smokeless tobacco: Never Used  Substance Use Topics  . Alcohol use: No    FAMILY HISTORY:   Family History  Problem Relation Age of Onset  . Diabetes Mellitus II Mother   . Heart failure Mother   . Pancreatic cancer Father     DRUG ALLERGIES:   Allergies   Allergen Reactions  . Chlorthalidone Anaphylaxis, Itching and Rash  . Fentanyl Rash  . Midazolam Rash  . Ace Inhibitors Other (See Comments)    Reaction:  Hyperkalemia, agitation   . Angiotensin Receptor Blockers Other (See Comments)    Reaction:  Hyperkalemia, agitation   . Norvasc [Amlodipine] Itching and Rash  . Phenergan [Promethazine Hcl] Anxiety    "antsy, can't sit still"    REVIEW OF SYSTEMS:  Limited with altered mental status   RESPIRATORY: No shortness of breath.  CARDIOVASCULAR: No chest pain.  GASTROINTESTINAL: No abdominal pain.   MEDICATIONS AT HOME:   Prior to Admission medications   Medication Sig Start Date End Date Taking? Authorizing Provider  acetaminophen (TYLENOL) 325 MG tablet Take 2 tablets (650 mg total) by mouth every 6 (six) hours as needed for mild pain (or Fever >/= 101). 03/23/18   [name], MD  apixaban (ELIQUIS) 2.5 MG TABS tablet Take 1 tablet (2.5 mg total) by mouth 2 (two) times daily. 02/15/18   Vaughan Basta, MD  aspirin EC 81 MG tablet Take 1 tablet (81 mg total) by mouth daily. 10/19/16   Dustin Flock, MD  atorvastatin (LIPITOR) 80 MG tablet Take 80 mg by mouth at bedtime.  05/05/17   [provider]  brimonidine (ALPHAGAN) 0.2 % ophthalmic solution Place 1 drop into both eyes 3 (three) times daily. 03/18/18   Bettey Costa, MD  calcitRIOL (ROCALTROL) 0.5 MCG capsule Take 0.5 mcg by mouth daily.    [provider]  carvedilol (COREG) 6.25 MG tablet Take 1 tablet (6.25 mg total) by mouth 2 (two) times daily. 03/18/18   Bettey Costa, MD  docusate sodium (COLACE) 100 MG capsule Take 1 capsule (100 mg total) by mouth 2 (two) times daily. 02/15/18   Vaughan Basta, MD  epoetin alfa (EPOGEN,PROCRIT) 65681 UNIT/ML injection Inject 1 mL (10,000 Units total) into the vein Every Tuesday,Thursday,and Saturday with dialysis. 03/24/18   [name], MD  famotidine (PEPCID) 20 MG tablet Take 20 mg by mouth 2 (two)  times daily.    [provider]  folic acid (FOLVITE) 1 MG tablet Take 1 tablet (1 mg total) by mouth daily. 02/15/18 02/15/19  Vaughan Basta, MD  isosorbide mononitrate (IMDUR) 30 MG 24 hr tablet Take 30 mg by mouth daily.     [provider]  levETIRAcetam (KEPPRA) 250 MG tablet Three times weekly after dialysis 05/12/18   Loletha Grayer, MD  levETIRAcetam (KEPPRA) 500 MG  tablet Take 1 tablet (500 mg total) by mouth 2 (two) times daily. 03/23/18   Gouru, Illene Silver, MD  midodrine (PROAMATINE) 10 MG tablet Take 1 tablet (10 mg total) by mouth 3 (three) times a week. Patient taking differently: Take 10 mg by mouth 3 (three) times a week. Take Tuesday, Thursday, and Saturday 02/17/18   Vaughan Basta, MD  mirtazapine (REMERON) 7.5 MG tablet Take by mouth. 12/18/17   [provider]  Multiple Vitamins-Minerals (PRORENAL + D) TABS Take 1 tablet by mouth daily.    [provider]  multivitamin (RENA-VIT) TABS tablet Take 1 tablet by mouth at bedtime. 05/12/18   Loletha Grayer, MD  nitroGLYCERIN (NITROSTAT) 0.4 MG SL tablet Place 1 tablet (0.4 mg total) under the tongue every 5 (five) minutes as needed for chest pain. 09/05/16   Loletha Grayer, MD  Nutritional Supplements (FEEDING SUPPLEMENT, NEPRO CARB STEADY,) LIQD Take 237 mLs by mouth 2 (two) times daily between meals. 05/12/18   Loletha Grayer, MD  Omega-3 1000 MG CAPS Take 2 capsules by mouth daily.     [provider]  ondansetron (ZOFRAN) 4 MG tablet Take 4 mg by mouth every 8 (eight) hours as needed for nausea or vomiting.    [provider]  Polyethyl Glycol-Propyl Glycol 0.4-0.3 % SOLN Place 1 drop into both eyes 2 (two) times daily.    [provider]  rOPINIRole (REQUIP) 0.25 MG tablet Take 1 tablet (0.25 mg total) by mouth at bedtime. 05/12/18   Loletha Grayer, MD  sevelamer carbonate (RENVELA) 800 MG tablet Take 800 mg by mouth 3 (three) times daily with meals.     [provider]  timolol (TIMOPTIC) 0.5 % ophthalmic solution Place 1 drop into both eyes 3 (three) times daily. 03/18/18   Bettey Costa, MD  traMADol (ULTRAM) 50 MG tablet Take 1 tablet (50 mg total) by mouth every 8 (eight) hours as needed for moderate pain or severe pain. 05/12/18   Loletha Grayer, MD  valsartan (DIOVAN) 80 MG tablet Take by mouth. 12/03/17 12/03/18  [provider]  vitamin B-12 (CYANOCOBALAMIN) 1000 MCG tablet Take 1 tablet (1,000 mcg total) by mouth daily. 02/15/18   Vaughan Basta, MD  vitamin C (VITAMIN C) 500 MG tablet Take 1 tablet (500 mg total) by mouth 2 (two) times daily. 05/12/18   Loletha Grayer, MD      VITAL SIGNS:  Blood pressure 115/67, pulse 67, resp. rate 16, weight 51.8 kg, SpO2 100 %.  PHYSICAL EXAMINATION:  GENERAL:  68 y.o.-year-old patient lying in the bed with no acute distress.  EYES: Left pupil irregular and dilated, right pupil reactive to light HEENT: Head atraumatic, normocephalic.  Patient refused to open mouth NECK:  Supple, no jugular venous distention. No thyroid enlargement, no tenderness.  LUNGS: Normal breath sounds bilaterally, no wheezing, rales,rhonchi or crepitation. No use of accessory muscles of respiration.  CARDIOVASCULAR: S1, S2 normal.  2 out of 6 systolic murmurs.no rubs, or gallops.  ABDOMEN: Soft, nontender, nondistended. Bowel sounds present.  EXTREMITIES: No pedal edema, cyanosis, or clubbing.  Left lower extremity large opening on skin with some greenish discoloration.  Bilateral lower extremity stumps NEUROLOGIC: Patient moving her left leg on her own.  Able to put down her arms on her own nice and easy.  Able to put down right leg nice and easy when I pick them up.  Sensation intact. Gait not checked. PSYCHIATRIC: The patient is alert and follows a few commands.  SKIN: Large left  lower extremity stump ulceration with some greenish discoloration.  LABORATORY PANEL:   CBC Recent Labs  Lab  07/08/18 1357  WBC 8.4  HGB 14.1  HCT 47.0*  PLT 127*   ------------------------------------------------------------------------------------------------------------------  Chemistries  Recent Labs  Lab 07/08/18 1357  NA 137  K 4.4  CL 97*  CO2 23  GLUCOSE 126*  BUN 43*  CREATININE 6.00*  CALCIUM 8.0*  AST 22  ALT 17  ALKPHOS 85  BILITOT 0.4   ------------------------------------------------------------------------------------------------------------------    RADIOLOGY:  Ct Head Code Stroke Wo Contrast  Addendum Date: 07/08/2018   ADDENDUM REPORT: 07/08/2018 14:15 ADDENDUM: Study discussed by telephone with Dr. Eula Listen on 07/08/2018 at 1357 hours. Electronically Signed   By: Genevie Ann M.D.   On: 07/08/2018 14:15   Result Date: 07/08/2018 CLINICAL DATA:  Code stroke. 68 year old female with left facial droop and syncope. Last known well 1250 hours. EXAM: CT HEAD WITHOUT CONTRAST TECHNIQUE: Contiguous axial images were obtained from the base of the skull through the vertex without intravenous contrast. COMPARISON:  Head CT 03/17/2018. Brain MRI 03/14/2018 and earlier. FINDINGS: Brain: No midline shift, mass effect, or evidence of intracranial mass lesion. No ventriculomegaly. No acute intracranial hemorrhage identified. New heterogeneity of the left thalamus on series 2, image 14, but this would result in right side symptoms. Gray-white matter differentiation otherwise appears stable. No cortically based acute infarct identified. Vascular: Extensive Calcified atherosclerosis at the skull base. No suspicious intracranial vascular hyperdensity. Skull: Negative. Sinuses/Orbits: Chronic left sphenoid sinusitis, otherwise stable and well pneumatized. Other: No acute orbit or scalp soft tissue findings. There is some calcified scalp vessel atherosclerosis. ASPECTS [facility] Stroke Program Early CT Score) - Ganglionic level infarction (caudate, lentiform nuclei, internal capsule,  insula, M1-M3 cortex): 7 - Supraganglionic infarction (M4-M6 cortex): 3 Total score (0-10 with 10 being normal): 10 IMPRESSION: 1. 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 2. No acute cortically based infarct or acute intracranial hemorrhage identified. ASPECTS is 10. 3. Calcified atherosclerosis, including in scalp vessels which can be seen in the setting of in stage renal disease. Electronically Signed: By: Genevie Ann M.D. On: 07/08/2018 13:52    EKG:   Sinus rhythm 73 bpm, left atrial enlargement, RSR prime and flattening of T waves laterally  IMPRESSION AND PLAN:   1.  Aphasia with code stroke.  Patient was almost finished with TPA when I was in there.  Neurology called me back and did not realize that the patient takes Eliquis.  Neurological status needs to be watched closely.  Patient is a partial code.  MRI and MRI of the brain without contrast ordered by neurology.  Will get speech, OT and PT evaluations tomorrow.  Can call neurology with any questions. 2.  Accelerated hypertension on presentation now blood pressure is low.  Allow permissive hypertension.  If blood pressure is high can use labetalol 10 to 20 mg IV over 1 to 2 minutes or nicardipine drip. 3.  Type 2 diabetes mellitus.  With TPA will hold off on fingersticks for right now.  Sugar on chemistry 126 4.  Peripheral vascular disease.  Holding Eliquis at this time 5.  Wound on left lower extremity with some greenish discoloration.  Started vancomycin and Cipro with Pseudomonas growing out of the last culture. 6.  GERD on H2 blocker 7.  Hypercholesterolemia on statin 8.  End-stage renal disease we will consult nephrology 9.  Diastolic congestive heart failure.  No signs of fluid overload currently.  Dialysis to manage fluid.    All the records are reviewed and case discussed with ED provider. Management plans discussed with the patient, family (daughter on the  phone) and they are in agreement.  CODE STATUS: Partial code no intubation but able to do everything else.  TOTAL TIME TAKING CARE OF THIS PATIENT: 50 minutes.  Case discussed with critical care specialist and neurologist.   Loletha Grayer M.D on 07/08/2018 at 3:06 PM  Between 7am to 6pm - Pager - [phone]  After 6pm call admission pager [phone]  [facility]  [phone]  CC: Primary care physician; Kirk Ruths, MD

## 2018-07-08 NOTE — Progress Notes (Addendum)
1625 hrs: Patient transported off of unit for MRI by SWOT.   1658 hrs: Patient returns at this time.

## 2018-07-08 NOTE — ED Notes (Signed)
CODE STROKE CALLED TO [phone] 

## 2018-07-08 NOTE — Progress Notes (Signed)
Neurology:  MRI reviewed.  Exam as per notes appears to be improving. MRI brain no signs of ischemia.  No signs of hemorrhage but has chronic L sided ischemia. S/p partial TPA dose. - SBP <180 overnight - CTH tomorrow at 1400 non contrast - Appreciate Dr. Inocente Salles assistance from renal team.    Will follow up in AM [facility], Alease Frame

## 2018-07-08 NOTE — Consult Note (Addendum)
Referring Physician: Mariea Clonts. Anne-Caroline    Chief Complaint: Unresponsiveness  HPI: Stephanie Ellis is an 68 y.o. female with past medical history of seizure disorder, PVD, ESRD on hemodialysis, diabetes mellitus, chronic diastolic CHF, bilateral below-knee amputation, endocarditis of tricuspid valve, CAD, MI, remote history of atrial fibrillation with RVR not on anticoagulation presenting to the ED from hemodialysis center with altered mental status.  Per EMS report, they were called by dialysis staff because patient suddenly lost consciousness during dialysis. No reports of associated cranial nerve deficit, seizures like activity, focal motor or sensory deficits, diplopia, dizziness, headache, nausea or vomiting, syncope or LOC, paresthesia (numbness, tingling, pins-and-needles sensation) or a heavy feeling in an extremity. On EMS arrival patient was minimally responsive initial vital signs blood pressure 187/79, HR 74, sats 96%, blood glucose 109. On arrival to the ED, she was awake but aphasic with facial asymmetry. Patient was able to track to voice and nods appropriately to questions. She was afebrile with blood pressure 207/80 mm Hg and pulse rate 73 beats/min.  A code stroke was initiated and patient evaluated by neurologist at the bedside. Initial NIH stroke scale 12.  A noncontrast head CT showed age indeterminate left thalamic lacunar infarct new since November otherwise no acute cortically based infarct or acute intracranial hemorrhage identified.  Patient was deemed a candidate for IV alteplase.   Date last known well: Date: 07/08/2018 Time last known well: Time: 12:50 tPA Given: Yes  Past Medical History:  Diagnosis Date  . (HFpEF) heart failure with preserved ejection fraction ([place])    a. 01/2018 Echo: EF 50-55%, no rwma, Gr1 DD, triv AI, mild MR. Midly dil LA/RV, mod reduced RV fxn. Irreg thickening of TV w/ mobile echodensity that appears to arise from valve.  . Anemia   . Anorexia    . Bacteremia due to Pseudomonas   . Carotid arterial disease ([place])    a. 01/2017 Carotid U/S: RICA <66, LICA <59.  Marland Kitchen Complication of anesthesia    a. Pt reports h/o complication on 9 different occasions - ? hypotension/arrest.  . 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 01/2015 Cath [facility]): 70% LM, p-mLAD 50-60% (Resting FFR 0.75), mRCA 80-90%, ~40 Ost OM & D1-->CABG; b. 01/2016 Staged PCI of LCX x 2 and RCA.  Marland Kitchen ESRD (end stage renal disease) on dialysis ([place])    a. ESRD secondary to acute kidney failure s/p CABG-->PD  . Essential hypertension   . GERD (gastroesophageal reflux disease)   . Heart murmur   . Hypercholesterolemia   . Myocardial infarction ([place]) 2016  . PVD (peripheral vascular disease) ([place])    a. 01/30/2018 PV Angio: Sev Left Popliteal dzs s/p PTA w/ 18m lutonix DEB w/ mech aspiration of the L popliteal, L AT, and Left tibioperoneal trunck and peroneal artery.  . 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  [facility]: Dr. BMarland KitchenHaithcock: CABG x 3, LIMA to LAD, SVG to RCA, SVG to OM3, EVH  . Sinusitis 2019  . Type II diabetes mellitus with complication (HCC)    CAD    Past Surgical History:  Procedure Laterality Date  . ABDOMINAL HYSTERECTOMY    . AMPUTATION Right 02/25/2018   Procedure: AMPUTATION BELOW KNEE;  Surgeon: SKatha Cabal MD;  Location: [facility];  Service: Vascular;  Laterality: Right;  . AMPUTATION Left 03/11/2018   Procedure: AMPUTATION BELOW KNEE;  Surgeon: DAlgernon Huxley MD;  Location: [facility];  Service: Vascular;  Laterality: Left;  .  AMPUTATION Bilateral 04/13/2018   Procedure: REVISION OF BILATERAL AMPUTATIONS BELOW KNEE WITH WOUND VAC APPLICATIONS;  Surgeon: Evaristo Bury, MD;  Location: [facility];  Service: Vascular;  Laterality: Bilateral;  . AMPUTATION TOE Left 02/06/2018   Procedure: AMPUTATION GREAT TOE;  Surgeon: Samara Deist, DPM;  Location: [facility];  Service: Podiatry;  Laterality: Left;  . APPLICATION OF WOUND VAC  Left 04/13/2018   Procedure: APPLICATION OF WOUND VAC;  Surgeon: Evaristo Bury, MD;  Location: [facility];  Service: Vascular;  Laterality: Left;  . ARTERIOVENOUS GRAFT PLACEMENT  05/2016  . AV FISTULA PLACEMENT Left 05/30/2016   Procedure: ARTERIOVENOUS graft;  Surgeon: Algernon Huxley, MD;  Location: [facility];  Service: Vascular;  Laterality: Left;  . CAPD INSERTION N/A 10/30/2017   Procedure: LAPAROSCOPIC INSERTION CONTINUOUS AMBULATORY PERITONEAL DIALYSIS  (CAPD) CATHETER;  Surgeon: Algernon Huxley, MD;  Location: [facility];  Service: Vascular;  Laterality: N/A;  . CARDIAC CATHETERIZATION  01/2015   [facility]: Ost LM 70%, p-m LAD 50-60% (Rest FFR + @ 0.75), mRCA 80-90%, ostD1 40%, pOM1 40%  . CATARACT EXTRACTION W/PHACO Left 01/18/2016   Procedure: CATARACT EXTRACTION PHACO AND INTRAOCULAR LENS PLACEMENT (IOC);  Surgeon: Eulogio Bear, MD;  Location: [facility];  Service: Ophthalmology;  Laterality: Left;  [place] 1.05 AP% 15.5 CDE 10.16 Fluid Pack Lot # [id number]  . CATARACT EXTRACTION W/PHACO Right 08/01/2016   Procedure: CATARACT EXTRACTION PHACO AND INTRAOCULAR LENS PLACEMENT (IOC);  Surgeon: Eulogio Bear, MD;  Location: [facility];  Service: Ophthalmology;  Laterality: Right;  [place] 01:00.6 AP% 11.4 CDE 6.93  LOT # [id number]  . COLONOSCOPY    . CORONARY ANGIOPLASTY     SENTS 02/12/16  . CORONARY ARTERY BYPASS GRAFT  03/28/15    [facility]: Dr. Waldemar Dickens: LIMA to LAD, SVG to RCA, SVG to OM3, EVH  . DIALYSIS/PERMA CATHETER INSERTION N/A 02/11/2018   Procedure: DIALYSIS/PERMA CATHETER INSERTION;  Surgeon: Algernon Huxley, MD;  Location: [place] [facility];  Service: Cardiovascular;  Laterality: N/A;  . DIALYSIS/PERMA CATHETER REMOVAL Right 02/04/2018   Procedure: DIALYSIS/PERMA CATHETER REMOVAL;  Surgeon: Katha Cabal, MD;  Location: [place] [facility];  Service: Cardiovascular;  Laterality: Right;  . ESOPHAGOGASTRODUODENOSCOPY (EGD) WITH PROPOFOL N/A 10/24/2016   Procedure: ESOPHAGOGASTRODUODENOSCOPY  (EGD) WITH PROPOFOL;  Surgeon: [name], MD;  Location: [facility];  Service: Endoscopy;  Laterality: N/A;  . EYE SURGERY Bilateral    cataract surgery  . EYE SURGERY     drains for glaucoma  . INSERTION EXPRESS TUBE SHUNT Right 08/01/2016   Procedure: INSERTION EXPRESS TUBE SHUNT;  Surgeon: Eulogio Bear, MD;  Location: [facility];  Service: Ophthalmology;  Laterality: Right;  . INSERTION OF AHMED VALVE Left 08/15/2016   Procedure: INSERTION OF AHMED VALVE;  Surgeon: Eulogio Bear, MD;  Location: [facility];  Service: Ophthalmology;  Laterality: Left;  . INSERTION OF DIALYSIS CATHETER    . LOWER EXTREMITY ANGIOGRAPHY Right 02/19/2018   Procedure: Lower Extremity Angiography;  Surgeon: Algernon Huxley, MD;  Location: [place] [facility];  Service: Cardiovascular;  Laterality: Right;  . LOWER EXTREMITY ANGIOGRAPHY Left 01/30/2018   Procedure: Lower Extremity Angiography;  Surgeon: Katha Cabal, MD;  Location: [place] [facility];  Service: Cardiovascular;  Laterality: Left;  . REMOVAL OF A DIALYSIS CATHETER N/A 02/25/2018   Procedure: REMOVAL OF A DIALYSIS CATHETER;  Surgeon: Katha Cabal, MD;  Location: [facility];  Service: Vascular;  Laterality: N/A;  . TEMPORARY DIALYSIS CATHETER  N/A 02/06/2018   Procedure: TEMPORARY DIALYSIS CATHETER;  Surgeon: Katha Cabal, MD;  Location: [place] [facility];  Service: Cardiovascular;  Laterality: N/A;  . TRANSTHORACIC ECHOCARDIOGRAM  January 2017    EF 60-65%. GR 2 DD. Mild degenerative mitral valve disease but no prolapse or regurgitation. Mild left atrial dilation. Mild to moderate LVH. Pericardial effusion gone  . UPPER EXTREMITY ANGIOGRAPHY Left 06/27/2016   Procedure: Upper Extremity Angiography;  Surgeon: Algernon Huxley, MD;  Location: [place] [facility];  Service: Cardiovascular;  Laterality: Left;  . UPPER EXTREMITY ANGIOGRAPHY Left 08/22/2016   Procedure: Upper Extremity Angiography;  Surgeon: Algernon Huxley, MD;  Location: [place] [facility];  Service: Cardiovascular;  Laterality: Left;  Marland Kitchen VASCULAR SURGERY    . WOUND DEBRIDEMENT Left 05/08/2018   Procedure: DEBRIDEMENT OF LEFT LEG BKA;  Surgeon: Algernon Huxley, MD;  Location: [facility];  Service: General;  Laterality: Left;    Family History  Problem Relation Age of Onset  . Diabetes Mellitus II Mother   . Heart failure Mother   . Pancreatic cancer Father    Social History:  reports that she has never smoked. She has never used smokeless tobacco. She reports that she does not drink alcohol or use drugs.  Allergies:  Allergies  Allergen Reactions  . Chlorthalidone Anaphylaxis, Itching and Rash  . Fentanyl Rash  . Midazolam Rash  . Ace Inhibitors Other (See Comments)    Reaction:  Hyperkalemia, agitation   . Angiotensin Receptor Blockers Other (See Comments)    Reaction:  Hyperkalemia, agitation   . Norvasc [Amlodipine] Itching and Rash  . Phenergan [Promethazine Hcl] Anxiety    "antsy, can't sit still"    Medications: I have reviewed the patient's current medications. Prior to Admission: (Not in a hospital admission)  Prior to Admission medications   Medication Sig Start Date End Date Taking? Authorizing Provider  acetaminophen (TYLENOL) 325 MG tablet Take 2 tablets (650 mg total) by mouth every 6 (six) hours as needed for mild pain (or Fever >/= 101). 03/23/18   [name], MD  apixaban (ELIQUIS) 2.5 MG TABS tablet Take 1 tablet (2.5 mg total) by mouth 2 (two) times daily. 02/15/18   Vaughan Basta, MD  aspirin EC 81 MG tablet Take 1 tablet (81 mg total) by mouth daily. 10/19/16   Dustin Flock, MD  atorvastatin (LIPITOR) 80 MG tablet Take 80 mg by mouth at bedtime.  05/05/17   [provider]  brimonidine (ALPHAGAN) 0.2 % ophthalmic solution Place 1 drop into both eyes 3 (three) times daily. 03/18/18   Bettey Costa, MD  calcitRIOL (ROCALTROL) 0.5 MCG capsule Take 0.5 mcg by mouth daily.    [provider]  carvedilol (COREG)  6.25 MG tablet Take 1 tablet (6.25 mg total) by mouth 2 (two) times daily. 03/18/18   Bettey Costa, MD  ciprofloxacin (CIPRO) 500 MG tablet Take 1 tablet (500 mg total) by mouth at bedtime. Patient not taking: Reported on 05/21/2018 05/12/18   Loletha Grayer, MD  docusate sodium (COLACE) 100 MG capsule Take 1 capsule (100 mg total) by mouth 2 (two) times daily. 02/15/18   Vaughan Basta, MD  epoetin alfa (EPOGEN,PROCRIT) 40102 UNIT/ML injection Inject 1 mL (10,000 Units total) into the vein Every Tuesday,Thursday,and Saturday with dialysis. 03/24/18   [name], MD  famotidine (PEPCID) 20 MG tablet Take 20 mg by mouth 2 (two) times daily.    [provider]  folic acid (FOLVITE) 1 MG tablet  Take 1 tablet (1 mg total) by mouth daily. 02/15/18 02/15/19  Vaughan Basta, MD  isosorbide mononitrate (IMDUR) 30 MG 24 hr tablet Take 30 mg by mouth daily.     [provider]  levETIRAcetam (KEPPRA) 250 MG tablet Three times weekly after dialysis 05/12/18   Loletha Grayer, MD  levETIRAcetam (KEPPRA) 500 MG tablet Take 1 tablet (500 mg total) by mouth 2 (two) times daily. 03/23/18   [name], MD  metroNIDAZOLE (FLAGYL) 500 MG tablet Take 1 tablet (500 mg total) by mouth every 12 (twelve) hours. Patient not taking: Reported on 06/10/2018 05/12/18   Loletha Grayer, MD  midodrine (PROAMATINE) 10 MG tablet Take 1 tablet (10 mg total) by mouth 3 (three) times a week. Patient taking differently: Take 10 mg by mouth 3 (three) times a week. Take Tuesday, Thursday, and Saturday 02/17/18   Vaughan Basta, MD  mirtazapine (REMERON) 7.5 MG tablet Take by mouth. 12/18/17   [provider]  Multiple Vitamins-Minerals (PRORENAL + D) TABS Take 1 tablet by mouth daily.    [provider]  multivitamin (RENA-VIT) TABS tablet Take 1 tablet by mouth at bedtime. 05/12/18   Loletha Grayer, MD  nitroGLYCERIN (NITROSTAT) 0.4 MG SL tablet Place 1 tablet (0.4 mg total)  under the tongue every 5 (five) minutes as needed for chest pain. 09/05/16   Loletha Grayer, MD  Nutritional Supplements (FEEDING SUPPLEMENT, NEPRO CARB STEADY,) LIQD Take 237 mLs by mouth 2 (two) times daily between meals. 05/12/18   Loletha Grayer, MD  Omega-3 1000 MG CAPS Take 2 capsules by mouth daily.     [provider]  ondansetron (ZOFRAN) 4 MG tablet Take 4 mg by mouth every 8 (eight) hours as needed for nausea or vomiting.    [provider]  Polyethyl Glycol-Propyl Glycol 0.4-0.3 % SOLN Place 1 drop into both eyes 2 (two) times daily.    [provider]  rOPINIRole (REQUIP) 0.25 MG tablet Take 1 tablet (0.25 mg total) by mouth at bedtime. 05/12/18   Loletha Grayer, MD  sevelamer carbonate (RENVELA) 800 MG tablet Take 800 mg by mouth 3 (three) times daily with meals.    [provider]  timolol (TIMOPTIC) 0.5 % ophthalmic solution Place 1 drop into both eyes 3 (three) times daily. 03/18/18   Bettey Costa, MD  traMADol (ULTRAM) 50 MG tablet Take 1 tablet (50 mg total) by mouth every 8 (eight) hours as needed for moderate pain or severe pain. 05/12/18   Loletha Grayer, MD  valsartan (DIOVAN) 80 MG tablet Take by mouth. 12/03/17 12/03/18  [provider]  vitamin B-12 (CYANOCOBALAMIN) 1000 MCG tablet Take 1 tablet (1,000 mcg total) by mouth daily. 02/15/18   Vaughan Basta, MD  vitamin C (VITAMIN C) 500 MG tablet Take 1 tablet (500 mg total) by mouth 2 (two) times daily. 05/12/18   Loletha Grayer, MD   Scheduled:   ROS: Unable to obtain from patient due to current altered mental status  Physical Examination: Blood pressure (!) 207/80, pulse 73, resp. rate 20, weight 51.8 kg, SpO2 97 %.   HEENT-  Normocephalic, no lesions, without obvious abnormality.  Normal external eye and conjunctiva.  Normal TM's bilaterally.  Normal auditory canals and external ears. Normal external nose, mucus membranes and septum.  Normal  pharynx. Cardiovascular- S1, S2 normal, pulses palpable throughout   Lungs- chest clear, no wheezing, rales, normal symmetric air entry Abdomen- soft, non-tender; bowel sounds normal; no masses,  no organomegaly Extremities- no edema Lymph-no  adenopathy palpable Musculoskeletal-no joint tenderness, deformity or swelling Skin-warm and dry, no hyperpigmentation, vitiligo, or suspicious lesions  Neurological Exam   Mental Status: Alert, unable to assess orientation due to significant expressive and receptive aphasia. Unable to follow commands. Attention span and concentration seemed appropriate  Cranial Nerves: II: Discs flat bilaterally; does not blink to visual threats bilaterally. pupils equal, round, reactive to light and accommodation III,IV, VI: ptosis not present, attempts to track but not consistent V,VII: smile asymmetric, mild left facial droop.  facial light touch sensation intact VIII: hearing normal bilaterally IX,X: Deffered XI: Unable to assess XII: midline tongue extension Motor: Able to break gravity in both upper extremity Right:   BKA                                         Left: BKA Tone and bulk:normal tone throughout; no atrophy noted Sensory: Responds to noxious stimuli bilaterally Deep Tendon Reflexes: 2+ and symmetric throughout Plantars: Bilateral BKA Cerebellar: Unable to assess Gait: not tested due to safety concerns  Data Reviewed  Laboratory Studies:  Basic Metabolic Panel: No results for input(s): NA, K, CL, CO2, GLUCOSE, BUN, CREATININE, CALCIUM, MG, PHOS in the last 168 hours.  Liver Function Tests: No results for input(s): AST, ALT, ALKPHOS, BILITOT, PROT, ALBUMIN in the last 168 hours. No results for input(s): LIPASE, AMYLASE in the last 168 hours. No results for input(s): AMMONIA in the last 168 hours.  CBC: No results for input(s): WBC, NEUTROABS, HGB, HCT, MCV, PLT in the last 168 hours.  Cardiac Enzymes: No results for input(s):  CKTOTAL, CKMB, CKMBINDEX, TROPONINI in the last 168 hours.  BNP: Invalid input(s): POCBNP  CBG: Recent Labs  Lab 07/08/18 1331  GLUCAP 97    Microbiology: Results for orders placed or performed during the hospital encounter of 05/02/18  Aerobic/Anaerobic Culture (surgical/deep wound)     Status: None   Collection Time: 05/02/18  6:20 PM  Result Value Ref Range Status   Specimen Description   Final    WOUND Performed at [facility], [address], [place], [place] [address]    Special Requests   Final    Normal Performed at [facility], [address] [place], [place] [address]    Gram Stain   Final    FEW WBC PRESENT, PREDOMINANTLY PMN ABUNDANT GRAM VARIABLE ROD    Culture   Final    MODERATE PSEUDOMONAS AERUGINOSA MODERATE VANCOMYCIN RESISTANT ENTEROCOCCUS NO ANAEROBES ISOLATED SEE SEPARATE REPORT FOR DOS 05/03/18 IN [facility] UNDER MISCELLANEOUS RESULTS TAB. Performed at [facility], [place] [address], [place], [place] [address]    Report Status 05/18/2018 FINAL  Final   Organism ID, Bacteria PSEUDOMONAS AERUGINOSA  Final   Organism ID, Bacteria VANCOMYCIN RESISTANT ENTEROCOCCUS  Final      Susceptibility   Pseudomonas aeruginosa - MIC*    CEFTAZIDIME 16 INTERMEDIATE Intermediate     CIPROFLOXACIN <=0.25 SENSITIVE Sensitive     GENTAMICIN <=1 SENSITIVE Sensitive     IMIPENEM 1 SENSITIVE Sensitive     CEFEPIME INTERMEDIATE Intermediate     * MODERATE PSEUDOMONAS AERUGINOSA   Vancomycin resistant enterococcus - MIC*    AMPICILLIN >=32 RESISTANT Resistant     VANCOMYCIN >=32 RESISTANT Resistant     GENTAMICIN SYNERGY SENSITIVE Sensitive     LINEZOLID 2 SENSITIVE Sensitive     * MODERATE VANCOMYCIN RESISTANT  ENTEROCOCCUS  Blood culture (routine x 2)     Status: None   Collection Time: 05/02/18  6:20 PM  Result Value Ref Range Status   Specimen Description BLOOD LFA  Final   Special Requests   Final    BOTTLES DRAWN AEROBIC AND ANAEROBIC  Blood Culture adequate volume   Culture   Final    NO GROWTH 5 DAYS Performed at [facility], [address] [place], [place] [address]    Report Status 05/07/2018 FINAL  Final  Susceptibility, Aer + Anaerob     Status: None   Collection Time: 05/02/18  6:20 PM  Result Value Ref Range Status   Suscept, Aer + Anaerob REFERT  Corrected    Comment: (NOTE) Please refer to the following specimen for additional lab results. Please refer to spec# [id number] for test result. Test order verified by by Shaune Leeks 05-12-2018 08:45 Performed At: [facility], [place] [id number] Rush Farmer MD JG:[id number] CORRECTED ON 01/21 AT 0936: PREVIOUSLY REPORTED AS Preliminary report    Source of Sample WOUND  Final    Comment: Performed at [facility], [place] [address], [place], [place] [address]  Susceptibility Result     Status: Abnormal   Collection Time: 05/02/18  6:20 PM  Result Value Ref Range Status   Suscept Result 1 Comment (A)  Final    Comment: (NOTE) Pseudomonas aeruginosa Request Problem Request Problem Please refer to the following specimen for additional lab results.      TEST:  [id number]  Susceptibility, Aer + Anaerob Please refer to spec# [id number] for test result. Test order verified by by Shaune Leeks 05-12-2018 08:45 Performed At: [facility] [place], [place] [id number] Rush Farmer MD [id number]   Blood culture (routine x 2)     Status: None   Collection Time: 05/02/18  6:24 PM  Result Value Ref Range Status   Specimen Description BLOOD LWRIST  Final   Special Requests   Final    BOTTLES DRAWN AEROBIC AND ANAEROBIC Blood Culture adequate volume   Culture   Final    NO GROWTH 5 DAYS Performed at [facility], [address] [place], [place] [address]    Report Status 05/07/2018 FINAL  Final  MRSA PCR Screening     Status: None   Collection Time: 05/03/18  1:04 AM  Result Value Ref  Range Status   MRSA by PCR NEGATIVE NEGATIVE Final    Comment:        The GeneXpert MRSA Assay (FDA approved for NASAL specimens only), is one component of a comprehensive MRSA colonization surveillance program. It is not intended to diagnose MRSA infection nor to guide or monitor treatment for MRSA infections. Performed at [facility], [address] [place], [place] [address]   Aerobic/Anaerobic Culture (surgical/deep wound)     Status: None   Collection Time: 05/08/18  1:55 PM  Result Value Ref Range Status   Specimen Description   Final    WOUND Performed at [facility], [address], [place], [place] [address]    Special Requests   Final    NONE Performed at [facility], [address] [place], [place] [address]    Gram Stain   Final    RARE WBC PRESENT, PREDOMINANTLY PMN RARE GRAM POSITIVE COCCI RARE GRAM POSITIVE RODS RARE GRAM VARIABLE ROD    Culture   Final    MODERATE PSEUDOMONAS AERUGINOSA NO  ANAEROBES ISOLATED Performed at [facility], [place] [address] [place], [place] [address]    Report Status 05/14/2018 FINAL  Final   Organism ID, Bacteria PSEUDOMONAS AERUGINOSA  Final      Susceptibility   Pseudomonas aeruginosa - MIC*    CEFTAZIDIME >=64 RESISTANT Resistant     CIPROFLOXACIN <=0.25 SENSITIVE Sensitive     GENTAMICIN <=1 SENSITIVE Sensitive     IMIPENEM <=0.25 SENSITIVE Sensitive     CEFEPIME 32 RESISTANT Resistant     * MODERATE PSEUDOMONAS AERUGINOSA    Coagulation Studies: No results for input(s): LABPROT, INR in the last 72 hours.  Urinalysis: No results for input(s): COLORURINE, LABSPEC, PHURINE, GLUCOSEU, HGBUR, BILIRUBINUR, KETONESUR, PROTEINUR, UROBILINOGEN, NITRITE, LEUKOCYTESUR in the last 168 hours.  Invalid input(s): APPERANCEUR  Lipid Panel: No results found for: CHOL, TRIG, HDL, CHOLHDL, VLDL, LDLCALC  HgbA1C:  Lab Results  Component Value Date   HGBA1C 6.0 (H) 05/05/2018    Urine  Drug Screen:  No results found for: LABOPIA, COCAINSCRNUR, LABBENZ, AMPHETMU, THCU, LABBARB  Alcohol Level: No results for input(s): ETH in the last 168 hours.  Other results: EKG: atrial fibrillation, rate 70.  Imaging: Ct Head Code Stroke Wo Contrast  Result Date: 07/08/2018 CLINICAL DATA:  Code stroke. 68 year old female with left facial droop and syncope. Last known well 1250 hours. EXAM: CT HEAD WITHOUT CONTRAST TECHNIQUE: Contiguous axial images were obtained from the base of the skull through the vertex without intravenous contrast. COMPARISON:  Head CT 03/17/2018. Brain MRI 03/14/2018 and earlier. FINDINGS: Brain: No midline shift, mass effect, or evidence of intracranial mass lesion. No ventriculomegaly. No acute intracranial hemorrhage identified. New heterogeneity of the left thalamus on series 2, image 14, but this would result in right side symptoms. Gray-white matter differentiation otherwise appears stable. No cortically based acute infarct identified. Vascular: Extensive Calcified atherosclerosis at the skull base. No suspicious intracranial vascular hyperdensity. Skull: Negative. Sinuses/Orbits: Chronic left sphenoid sinusitis, otherwise stable and well pneumatized. Other: No acute orbit or scalp soft tissue findings. There is some calcified scalp vessel atherosclerosis. ASPECTS [facility] Stroke Program Early CT Score) - Ganglionic level infarction (caudate, lentiform nuclei, internal capsule, insula, M1-M3 cortex): 7 - Supraganglionic infarction (M4-M6 cortex): 3 Total score (0-10 with 10 being normal): 10 IMPRESSION: 1. 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 2. No acute cortically based infarct or acute intracranial hemorrhage identified. ASPECTS is 10. 3. Calcified atherosclerosis, including in scalp vessels which can be seen in the setting of in stage renal disease. Electronically Signed   By: Genevie Ann M.D.   On:  07/08/2018 13:52   Assessment: 68 y.o. female with past medical history of seizure disorder, PVD, ESRD on hemodialysis, diabetes mellitus, chronic diastolic CHF, bilateral below-knee amputation, endocarditis of tricuspid valve, CAD, MI, remote history of atrial fibrillation with RVR presenting to the ED from hemodialysis center with altered mental status. Concerns for ischemic event.  Initial CT head reviewed and shows age indeterminate left thalamic lacunar infarct new since November otherwise no acute cortically based infarct or acute intracranial hemorrhage identified. Determination was made that patient was eligible for IV tPA after  benefit-to-harm ratio of alteplase was assessed and consent obtained from next of kin. IV alteplase infusion was started at 1401 however prior to completion of the infusion, it was found that patient was on anticoagulation Eliquis for Afib. This information was not readily available in patient's chart and medication administration record  from the Dialysis center. Infusion was stopped immediately. Neuro-exam remains stable without new focality.  Stroke Risk Factors - atrial fibrillation, hypertension, DM, family history.  Plan: 1. MRI, MRA of the brain without contrast 2.Transfer to the  ICU. 3. Vital signs/blood pressure with neuro checks/NIHSS  per tPA protocol for the first 24 hours after receiving IV  4. Allow permissive hypertension. If SBP > 180 or DBP > 105 on 2 consecutive checks, then administer PRN IV anti-hypertensive medication (Labetalol 10-20 mg IV over 1-2 minutes or start Nicardipine drip at 5 mg/hr and can titrate up every 2.5 mg/hr until parameters are achieved) and notify MD. 5. No anti-platelets or Lovenox for 24 hours s/p IV Alteplase per protocol. 6. Place SCDs for DVT prevention. 7. Will repeat head CT in 24 hours s/p IV Alteplase per protocol. 8. Obtain STAT head CT for any new acute headache or new neurological deficits 9. HgbA1c, fasting  lipid panel 10. PT consult, OT consult, Speech consult 11. Echocardiogram 12. Carotid dopplers 13. NPO until RN stroke swallow screen 14. Telemetry monitoring   This patient was staffed with Dr. Irish Elders, [facility] Frame who personally evaluated patient, reviewed documentation and agreed with assessment and plan of care as above.  Rufina Falco, DNP, FNP-BC Board certified Nurse Practitioner Neurology Department  Addendum: Not CTA candidate as poor baseline, lives in facility and is on HD. S/p TPA pt is to be admitted to ICU. MRI brain/MRA.  24hr CTH at 1400.  Case d/w daughter over the phone. Leotis Pain       07/08/2018, 2:04 PM

## 2018-07-08 NOTE — Consult Note (Signed)
Name: Stephanie Ellis MRN: [id number] DOB: 09-29-1950    ADMISSION DATE:  07/08/2018 CONSULTATION DATE: 07/08/2018  REFERRING MD : Dr. Leslye Peer   CHIEF COMPLAINT: Altered Mental Status   BRIEF PATIENT DESCRIPTION:  68 yo female with hx of ESRD on HD admitted with acute encephalopathy concerning for ischemic event CT Head revealed age indeterminate left thalamic lacunar infarct new since 02/2018 pt deemed a candidate for IV alteplase initially and TPA started. However, pt on outpatient renal dose eliquis therefore IV alteplase discontinued.  SIGNIFICANT EVENTS/STUDIES:  03/18-CT Head Code Stroke revealed age indeterminate left thalamic lacunar infarct is new since November but would result in right side symptoms. No acute cortically based infarct or acute intracranial hemorrhage identified. ASPECTS is 10. Calcified atherosclerosis, including in scalp vessels which can be seen in the setting of in stage renal disease. 03/18-Pt admitted to ICU met criteria for IV alteplase initially  and TPA started. However, pt on outpatient renal dose eliquis therefore IV alteplase discontinued.  HISTORY OF PRESENT ILLNESS:   This is a 68 yo female with a PMH of Type II Diabetes Mellitus, CABG x3, PVD, MI, Hypercholesteremia, Heart Murmur, Endocarditis of Tricuspid Valve, Bilateral Below Knee Amputation (pt underwent irrigation and debridement of left BKA with VAC placement on 05/08/2018, however per [place] pt constantly pulled off wound vac notified vascular surgeon Dr. Lucky Cowboy on 06/17/2018), Seizure Disorder, GERD, Essential HTN, ESRD on HD, CAD, Anorexia, Anemia, and Chronic Diastolic CHF (78/9381 Echo: EF 40% to 45%).  She [facility] to [facility] ER on 03/18 via EMS from hemodialysis center with altered mental status (last known well time 07/08/2018 at 12:50 pm) she does reside at [facility].  Per ER notes EMS reported they were called by dialysis staff because the pt suddenly lost consciousness during  dialysis.  Upon EMS arrival pt minimally responsive initial bp 187/79, hr 74, O2 sats 96%, and blood glucose 109.  In the ER pt awake, however aphasic with facial asymmetry.  Therefore, code stroke initiated and neurology consulted.  Initial NIH stroke scale 12.  CT Head revealed age indeterminate left thalamic lacunar infarct new since 02/2018 pt deemed a candidate for IV alteplase initially and TPA started. However, pt on outpatient renal dose eliquis therefore IV alteplase discontinued per neurology no need for reversal. She was subsequently admitted to the ICU by hospitalist team for additional workup and treatment.    PAST MEDICAL HISTORY :   has a past medical history of (HFpEF) heart failure with preserved ejection fraction ([place]), Anemia, Anorexia, Bacteremia due to Pseudomonas, Carotid arterial disease ([place]), Complication of anesthesia, Coronary artery disease involving left main coronary artery (01/2015), ESRD (end stage renal disease) on dialysis [facility]), Essential hypertension, GERD (gastroesophageal reflux disease), Heart murmur, Hypercholesterolemia, Myocardial infarction ([place]) (2016), PVD (peripheral vascular disease) ([place]), S/P CABG x 3 (03/24/2015), Sinusitis (2019), and Type II diabetes mellitus with complication ([place]).  has a past surgical history that includes Abdominal hysterectomy; Insertion of dialysis catheter; Cardiac catheterization (01/2015); transthoracic echocardiogram (January 2017); Cataract extraction w/PHACO (Left, 01/18/2016); Coronary artery bypass graft (03/28/15); Coronary angioplasty; AV fistula placement (Left, 05/30/2016); Upper Extremity Angiography (Left, 06/27/2016); Colonoscopy; Cataract extraction w/PHACO (Right, 08/01/2016); Insertion express tube shunt (Right, 08/01/2016); Insertion of ahmed valve (Left, 08/15/2016); Upper Extremity Angiography (Left, 08/22/2016); Esophagogastroduodenoscopy (egd) with propofol (N/A, 10/24/2016); Arteriovenous graft placement (05/2016); Eye surgery  (Bilateral); Eye surgery; CAPD insertion (N/A, 10/30/2017); DIALYSIS/PERMA CATHETER REMOVAL (Right, 02/04/2018); Amputation toe (Left, 02/06/2018); TEMPORARY DIALYSIS CATHETER (N/A, 02/06/2018); DIALYSIS/PERMA CATHETER INSERTION (N/A, 02/11/2018);  Lower Extremity Angiography (Right, 02/19/2018); Amputation (Right, 02/25/2018); Removal of a dialysis catheter (N/A, 02/25/2018); Vascular surgery; Lower Extremity Angiography (Left, 01/30/2018); Amputation (Left, 03/11/2018); Amputation (Bilateral, 04/13/2018); Application if wound vac (Left, 04/13/2018); and Wound debridement (Left, 05/08/2018). Prior to Admission medications   Medication Sig Start Date End Date Taking? Authorizing Provider  acetaminophen (TYLENOL) 325 MG tablet Take 2 tablets (650 mg total) by mouth every 6 (six) hours as needed for mild pain (or Fever >/= 101). 03/23/18   [name], MD  apixaban (ELIQUIS) 2.5 MG TABS tablet Take 1 tablet (2.5 mg total) by mouth 2 (two) times daily. 02/15/18   Vaughan Basta, MD  aspirin EC 81 MG tablet Take 1 tablet (81 mg total) by mouth daily. 10/19/16   Dustin Flock, MD  atorvastatin (LIPITOR) 80 MG tablet Take 80 mg by mouth at bedtime.  05/05/17   [provider]  brimonidine (ALPHAGAN) 0.2 % ophthalmic solution Place 1 drop into both eyes 3 (three) times daily. 03/18/18   Bettey Costa, MD  calcitRIOL (ROCALTROL) 0.5 MCG capsule Take 0.5 mcg by mouth daily.    [provider]  carvedilol (COREG) 6.25 MG tablet Take 1 tablet (6.25 mg total) by mouth 2 (two) times daily. 03/18/18   Bettey Costa, MD  ciprofloxacin (CIPRO) 500 MG tablet Take 1 tablet (500 mg total) by mouth at bedtime. Patient not taking: Reported on 05/21/2018 05/12/18   Loletha Grayer, MD  docusate sodium (COLACE) 100 MG capsule Take 1 capsule (100 mg total) by mouth 2 (two) times daily. 02/15/18   Vaughan Basta, MD  epoetin alfa (EPOGEN,PROCRIT) 08811 UNIT/ML injection Inject 1 mL (10,000 Units total)  into the vein Every Tuesday,Thursday,and Saturday with dialysis. 03/24/18   [name], MD  famotidine (PEPCID) 20 MG tablet Take 20 mg by mouth 2 (two) times daily.    [provider]  folic acid (FOLVITE) 1 MG tablet Take 1 tablet (1 mg total) by mouth daily. 02/15/18 02/15/19  Vaughan Basta, MD  isosorbide mononitrate (IMDUR) 30 MG 24 hr tablet Take 30 mg by mouth daily.     [provider]  levETIRAcetam (KEPPRA) 250 MG tablet Three times weekly after dialysis 05/12/18   Loletha Grayer, MD  levETIRAcetam (KEPPRA) 500 MG tablet Take 1 tablet (500 mg total) by mouth 2 (two) times daily. 03/23/18   [name], MD  metroNIDAZOLE (FLAGYL) 500 MG tablet Take 1 tablet (500 mg total) by mouth every 12 (twelve) hours. Patient not taking: Reported on 06/10/2018 05/12/18   Loletha Grayer, MD  midodrine (PROAMATINE) 10 MG tablet Take 1 tablet (10 mg total) by mouth 3 (three) times a week. Patient taking differently: Take 10 mg by mouth 3 (three) times a week. Take Tuesday, Thursday, and Saturday 02/17/18   Vaughan Basta, MD  mirtazapine (REMERON) 7.5 MG tablet Take by mouth. 12/18/17   [provider]  Multiple Vitamins-Minerals (PRORENAL + D) TABS Take 1 tablet by mouth daily.    [provider]  multivitamin (RENA-VIT) TABS tablet Take 1 tablet by mouth at bedtime. 05/12/18   Loletha Grayer, MD  nitroGLYCERIN (NITROSTAT) 0.4 MG SL tablet Place 1 tablet (0.4 mg total) under the tongue every 5 (five) minutes as needed for chest pain. 09/05/16   Loletha Grayer, MD  Nutritional Supplements (FEEDING SUPPLEMENT, NEPRO CARB STEADY,) LIQD Take 237 mLs by mouth 2 (two) times daily between meals. 05/12/18   Loletha Grayer, MD  Omega-3 1000 MG CAPS Take 2 capsules by mouth daily.  [provider]  ondansetron (ZOFRAN) 4 MG tablet Take 4 mg by mouth every 8 (eight) hours as needed for nausea or vomiting.    [provider]  Polyethyl  Glycol-Propyl Glycol 0.4-0.3 % SOLN Place 1 drop into both eyes 2 (two) times daily.    [provider]  rOPINIRole (REQUIP) 0.25 MG tablet Take 1 tablet (0.25 mg total) by mouth at bedtime. 05/12/18   Loletha Grayer, MD  sevelamer carbonate (RENVELA) 800 MG tablet Take 800 mg by mouth 3 (three) times daily with meals.    [provider]  timolol (TIMOPTIC) 0.5 % ophthalmic solution Place 1 drop into both eyes 3 (three) times daily. 03/18/18   Bettey Costa, MD  traMADol (ULTRAM) 50 MG tablet Take 1 tablet (50 mg total) by mouth every 8 (eight) hours as needed for moderate pain or severe pain. 05/12/18   Loletha Grayer, MD  valsartan (DIOVAN) 80 MG tablet Take by mouth. 12/03/17 12/03/18  [provider]  vitamin B-12 (CYANOCOBALAMIN) 1000 MCG tablet Take 1 tablet (1,000 mcg total) by mouth daily. 02/15/18   Vaughan Basta, MD  vitamin C (VITAMIN C) 500 MG tablet Take 1 tablet (500 mg total) by mouth 2 (two) times daily. 05/12/18   Loletha Grayer, MD   Allergies  Allergen Reactions   Chlorthalidone Anaphylaxis, Itching and Rash   Fentanyl Rash   Midazolam Rash   Ace Inhibitors Other (See Comments)    Reaction:  Hyperkalemia, agitation    Angiotensin Receptor Blockers Other (See Comments)    Reaction:  Hyperkalemia, agitation    Norvasc [Amlodipine] Itching and Rash   Phenergan [Promethazine Hcl] Anxiety    "antsy, can't sit still"    FAMILY HISTORY:  family history includes Diabetes Mellitus II in her mother; Heart failure in her mother; Pancreatic cancer in her father. SOCIAL HISTORY:  reports that she has never smoked. She has never used smokeless tobacco. She reports that she does not drink alcohol or use drugs.  REVIEW OF SYSTEMS:   Unable to assess pt nonverbal   SUBJECTIVE:  Unable to assess pt nonverbal   VITAL SIGNS: Pulse Rate:  [55-73] 67 (03/18 1445) Resp:  [13-20] 16 (03/18 1445) BP: (115-207)/(47-93) 115/67 (03/18 1445) SpO2:   [97 %-100 %] 100 % (03/18 1415) Weight:  [51.8 kg] 51.8 kg (03/18 1351)  PHYSICAL EXAMINATION: General: chronically ill appearing female Neuro: nonverbal, unable to follow commands, bilateral pupils unequal and non reactive, mild left facial droop HEENT: supple, no JVD  Cardiovascular: sinus rhythm, 2/6 systolic murmur, no R/G Lungs: clear throughout, even, non labored  Abdomen: +BS x4, soft, non tender, non distended  Musculoskeletal: bilateral BKA, extremities warm to touch  Skin: left BKA wound (no foul odor, drainage, or necrotic tissue present 100% granulation tissue present)   Recent Labs  Lab 07/08/18 1357  NA 137  K 4.4  CL 97*  CO2 23  BUN 43*  CREATININE 6.00*  GLUCOSE 126*   No results for input(s): HGB, HCT, WBC, PLT in the last 168 hours. Ct Head Code Stroke Wo Contrast  Addendum Date: 07/08/2018   ADDENDUM REPORT: 07/08/2018 14:15 ADDENDUM: Study discussed by telephone with Dr. Eula Listen on 07/08/2018 at 1357 hours. Electronically Signed   By: Genevie Ann M.D.   On: 07/08/2018 14:15   Result Date: 07/08/2018 CLINICAL DATA:  Code stroke. 68 year old female with left facial droop and syncope. Last known well 1250 hours. EXAM: CT HEAD WITHOUT CONTRAST TECHNIQUE: Contiguous axial images were obtained  from the base of the skull through the vertex without intravenous contrast. COMPARISON:  Head CT 03/17/2018. Brain MRI 03/14/2018 and earlier. FINDINGS: Brain: No midline shift, mass effect, or evidence of intracranial mass lesion. No ventriculomegaly. No acute intracranial hemorrhage identified. New heterogeneity of the left thalamus on series 2, image 14, but this would result in right side symptoms. Gray-white matter differentiation otherwise appears stable. No cortically based acute infarct identified. Vascular: Extensive Calcified atherosclerosis at the skull base. No suspicious intracranial vascular hyperdensity. Skull: Negative. Sinuses/Orbits: Chronic left sphenoid  sinusitis, otherwise stable and well pneumatized. Other: No acute orbit or scalp soft tissue findings. There is some calcified scalp vessel atherosclerosis. ASPECTS [facility] Stroke Program Early CT Score) - Ganglionic level infarction (caudate, lentiform nuclei, internal capsule, insula, M1-M3 cortex): 7 - Supraganglionic infarction (M4-M6 cortex): 3 Total score (0-10 with 10 being normal): 10 IMPRESSION: 1. 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 2. No acute cortically based infarct or acute intracranial hemorrhage identified. ASPECTS is 10. 3. Calcified atherosclerosis, including in scalp vessels which can be seen in the setting of in stage renal disease. Electronically Signed: By: Genevie Ann M.D. On: 07/08/2018 13:52    ASSESSMENT / PLAN:  Acute encephalopathy: CT Head 07/08/2018 revealed age indeterminate left thalamic lacunar infarct new since 02/2018 pt deemed a candidate for IV alteplase initially started, however discontinued pt on outpatient renal dose eliquis Hx: Seizure Disorder  Neurology consulted appreciate input  MRI, MRA Brain pending Repeat CT Head 24hrs s/p IV alteplase per protocol Echo and Carotid dopplers pending  Hemoglobin A1c, fasting lipid panel, and ammonia level pending  VTE px: SCD's, No anti-platelets or chemical anticoagulation for 24 hours s/p IV Alteplase per protocol Trend CBC  Monitor for s/sx of bleeding and transfuse for hgb <7 or active signs of bleeding  Continue keppra   Accelerated Hypertension  Hx: Atrial Fibrillation, Chronic Diastolic CHF, PVD, and CABG x3 Continuous telemetry monitoring  Continue outpatient atorvastatin  Allow for permissive hypertension     -If SBP > 180 or DBP > 105 on 2 consecutive checks, then administer PRN IV anti-hypertensive medication (Labetalol 10-20 mg IV over 1-2 minutes or start Nicardipine drip at 5 mg/hr and can titrate up every 2.5 mg/hr until  parameters are achieved) and notify MD.  ESRD on HD  Trend BMP  Replace electrolytes as indicated  Avoid nephrotoxic medications  Nephrology consulted appreciate input-HD per recommendations   Hx of left stump infection (wound culture 05/08/2018 positive for pseudomonas aeruginosa-completed course of antibiotics per ID recommendations) Vascular surgery consulted appreciate input  Trend WBC and monitor fever curve  Discontinued vancomycin and cipro  If pt spikes temp will pan culture and start empiric abx   Marda Stalker, [place] Pager [phone] (please enter 7 digits) PCCM Consult Pager [phone] (please enter 7 digits)

## 2018-07-08 NOTE — ED Notes (Signed)
Elizabeth NP reports do not stick patient again for second IV or more labs until TPA finished

## 2018-07-08 NOTE — Progress Notes (Signed)
CRITICAL VALUE ALERT  Critical Value:  Troponin 0.09  Date & Time Notied:  2909 hrs, 07/08/2018  Provider Notified: Marda Stalker, NP  Orders Received/Actions taken: Trend trops

## 2018-07-08 NOTE — ED Notes (Signed)
Patient transported to CT 

## 2018-07-08 NOTE — ED Notes (Signed)
MD wieting at bedside

## 2018-07-08 NOTE — ED Provider Notes (Addendum)
[facility] Emergency Department Provider Note  ____________________________________________  Time seen: Approximately 1:41 PM  I have reviewed the triage vital signs and the nursing notes.   HISTORY  Chief Complaint Altered Mental Status    HPI Stephanie Ellis is a 68 y.o. female with a history of ESRD on HD, CAD status post MI, BKA, history of encephalopathy, presenting for altered mental status.  The patient is unable to give any history due to being nonverbal.  EMS reports that she was at dialysis and after 2 hours, she became nonverbal.  Her report is that at baseline she is usually verbal.  EMS reports blood sugar of 109, and hypertension.  Past Medical History:  Diagnosis Date  . (HFpEF) heart failure with preserved ejection fraction ([place])    a. 01/2018 Echo: EF 50-55%, no rwma, Gr1 DD, triv AI, mild MR. Midly dil LA/RV, mod reduced RV fxn. Irreg thickening of TV w/ mobile echodensity that appears to arise from valve.  . Anemia   . Anorexia   . Bacteremia due to Pseudomonas   . Carotid arterial disease ([place])    a. 01/2017 Carotid U/S: RICA <78, LICA <67.  Marland Kitchen Complication of anesthesia    a. Pt reports h/o complication on 9 different occasions - ? hypotension/arrest.  . 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 01/2015 Cath [facility]): 70% LM, p-mLAD 50-60% (Resting FFR 0.75), mRCA 80-90%, ~40 Ost OM & D1-->CABG; b. 01/2016 Staged PCI of LCX x 2 and RCA.  Marland Kitchen ESRD (end stage renal disease) on dialysis ([place])    a. ESRD secondary to acute kidney failure s/p CABG-->PD  . Essential hypertension   . GERD (gastroesophageal reflux disease)   . Heart murmur   . Hypercholesterolemia   . Myocardial infarction ([place]) 2016  . PVD (peripheral vascular disease) ([place])    a. 01/30/2018 PV Angio: Sev Left Popliteal dzs s/p PTA w/ 34m lutonix DEB w/ mech aspiration of the L popliteal, L AT, and Left tibioperoneal trunck and peroneal artery.  .  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  [facility]: Dr. BMarland KitchenHaithcock: CABG x 3, LIMA to LAD, SVG to RCA, SVG to OM3, EVH  . Sinusitis 2019  . Type II diabetes mellitus with complication ([facility]    CAD    Patient Active Problem List   Diagnosis Date Noted  . CVA (cerebral vascular accident) ([place] 07/08/2018  . Infection of amputation stump ([place] 05/02/2018  . BKA stump complication ([place] 167/20/9470 . Unilateral complete BKA, right, initial encounter ([place] 03/30/2018  . Transient loss of consciousness 03/19/2018  . Transient alteration of awareness   . VT (ventricular tachycardia) ([place]   . Disorientation   . Hypertensive urgency 03/08/2018  . Ischemia of extremity 02/18/2018  . Diabetic osteomyelitis ([place]   . Peripheral vascular disease ([place]   . Advanced care planning/counseling discussion   . Palliative care by specialist   . Goals of care, counseling/discussion   . Diabetes mellitus, type 2 ([place] 01/30/2018  . Sepsis ([place] 01/29/2018  . Peripheral neuropathy 06/17/2017  . Encephalopathy 02/06/2017  . Hypertensive emergency 02/06/2017  . Endophthalmitis, left eye 12/05/2016  . Vomiting 10/24/2016  . Hematuria 10/17/2016  . Acute lower UTI 10/17/2016  . Anesthesia complication 096/28/3662 . Chest pain with moderate risk for cardiac etiology 09/04/2016  . Steal syndrome dialysis vascular access, initial encounter ([place] 06/18/2016  . 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 Coronary artery disease involving coronary  bypass graft of native heart with angina pectoris ([place]) 02/10/2016  . Hypertension 02/02/2016  . Hyperlipidemia 02/02/2016  . Coronary artery disease involving left main coronary artery 10/04/2015  . Abdominal pain of unknown etiology 10/04/2015  . ESRD (end stage renal disease) on dialysis ([place])   . Type II diabetes mellitus with complication ([place])   . NSTEMI (non-ST elevated myocardial infarction) ([place])   . Right sided abdominal pain   . Elevated  troponin 10/03/2015  . 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 Chronic diastolic congestive heart failure ([place]) 04/19/2014  . Anemia 09/20/2013  . Routine health maintenance 01/29/2013    Past Surgical History:  Procedure Laterality Date  . ABDOMINAL HYSTERECTOMY    . AMPUTATION Right 02/25/2018   Procedure: AMPUTATION BELOW KNEE;  Surgeon: Katha Cabal, MD;  Location: [facility] ORS;  Service: Vascular;  Laterality: Right;  . AMPUTATION Left 03/11/2018   Procedure: AMPUTATION BELOW KNEE;  Surgeon: Algernon Huxley, MD;  Location: [facility];  Service: Vascular;  Laterality: Left;  . AMPUTATION Bilateral 04/13/2018   Procedure: REVISION OF BILATERAL AMPUTATIONS BELOW KNEE WITH WOUND VAC APPLICATIONS;  Surgeon: Evaristo Bury, MD;  Location: [facility];  Service: Vascular;  Laterality: Bilateral;  . AMPUTATION TOE Left 02/06/2018   Procedure: AMPUTATION GREAT TOE;  Surgeon: Samara Deist, DPM;  Location: [facility];  Service: Podiatry;  Laterality: Left;  . APPLICATION OF WOUND VAC Left 04/13/2018   Procedure: APPLICATION OF WOUND VAC;  Surgeon: Evaristo Bury, MD;  Location: [facility];  Service: Vascular;  Laterality: Left;  . ARTERIOVENOUS GRAFT PLACEMENT  05/2016  . AV FISTULA PLACEMENT Left 05/30/2016   Procedure: ARTERIOVENOUS graft;  Surgeon: Algernon Huxley, MD;  Location: [facility];  Service: Vascular;  Laterality: Left;  . CAPD INSERTION N/A 10/30/2017   Procedure: LAPAROSCOPIC INSERTION CONTINUOUS AMBULATORY PERITONEAL DIALYSIS  (CAPD) CATHETER;  Surgeon: Algernon Huxley, MD;  Location: [facility];  Service: Vascular;  Laterality: N/A;  . CARDIAC CATHETERIZATION  01/2015   [facility]: Ost LM 70%, p-m LAD 50-60% (Rest FFR + @ 0.75), mRCA 80-90%, ostD1 40%, pOM1 40%  . CATARACT EXTRACTION W/PHACO Left 01/18/2016   Procedure: CATARACT EXTRACTION PHACO AND INTRAOCULAR LENS PLACEMENT (IOC);  Surgeon: Eulogio Bear, MD;  Location: [facility];  Service: Ophthalmology;  Laterality: Left;  [place] 1.05 AP% 15.5 CDE 10.16 Fluid  Pack Lot # [id number]  . CATARACT EXTRACTION W/PHACO Right 08/01/2016   Procedure: CATARACT EXTRACTION PHACO AND INTRAOCULAR LENS PLACEMENT (IOC);  Surgeon: Eulogio Bear, MD;  Location: [facility];  Service: Ophthalmology;  Laterality: Right;  [place] 01:00.6 AP% 11.4 CDE 6.93  LOT # [id number]  . COLONOSCOPY    . CORONARY ANGIOPLASTY     SENTS 02/12/16  . CORONARY ARTERY BYPASS GRAFT  03/28/15    [facility]: Dr. Waldemar Dickens: LIMA to LAD, SVG to RCA, SVG to OM3, EVH  . DIALYSIS/PERMA CATHETER INSERTION N/A 02/11/2018   Procedure: DIALYSIS/PERMA CATHETER INSERTION;  Surgeon: Algernon Huxley, MD;  Location: [place] CV LAB;  Service: Cardiovascular;  Laterality: N/A;  . DIALYSIS/PERMA CATHETER REMOVAL Right 02/04/2018   Procedure: DIALYSIS/PERMA CATHETER REMOVAL;  Surgeon: Katha Cabal, MD;  Location: [place] CV LAB;  Service: Cardiovascular;  Laterality: Right;  . ESOPHAGOGASTRODUODENOSCOPY (EGD) WITH PROPOFOL N/A 10/24/2016   Procedure: ESOPHAGOGASTRODUODENOSCOPY (EGD) WITH PROPOFOL;  Surgeon: [name], MD;  Location: [facility];  Service: Endoscopy;  Laterality: N/A;  . EYE SURGERY Bilateral    cataract surgery  . EYE SURGERY  drains for glaucoma  . INSERTION EXPRESS TUBE SHUNT Right 08/01/2016   Procedure: INSERTION EXPRESS TUBE SHUNT;  Surgeon: Eulogio Bear, MD;  Location: [facility];  Service: Ophthalmology;  Laterality: Right;  . INSERTION OF AHMED VALVE Left 08/15/2016   Procedure: INSERTION OF AHMED VALVE;  Surgeon: Eulogio Bear, MD;  Location: [facility];  Service: Ophthalmology;  Laterality: Left;  . INSERTION OF DIALYSIS CATHETER    . LOWER EXTREMITY ANGIOGRAPHY Right 02/19/2018   Procedure: Lower Extremity Angiography;  Surgeon: Algernon Huxley, MD;  Location: [place] CV LAB;  Service: Cardiovascular;  Laterality: Right;  . LOWER EXTREMITY ANGIOGRAPHY Left 01/30/2018   Procedure: Lower Extremity Angiography;  Surgeon: Katha Cabal, MD;  Location: [place] CV LAB;  Service: Cardiovascular;  Laterality: Left;  . REMOVAL OF A DIALYSIS CATHETER N/A 02/25/2018   Procedure: REMOVAL OF A DIALYSIS CATHETER;  Surgeon: Katha Cabal, MD;  Location: [facility];  Service: Vascular;  Laterality: N/A;  . TEMPORARY DIALYSIS CATHETER N/A 02/06/2018   Procedure: TEMPORARY DIALYSIS CATHETER;  Surgeon: Katha Cabal, MD;  Location: [place] CV LAB;  Service: Cardiovascular;  Laterality: N/A;  . TRANSTHORACIC ECHOCARDIOGRAM  January 2017    EF 60-65%. GR 2 DD. Mild degenerative mitral valve disease but no prolapse or regurgitation. Mild left atrial dilation. Mild to moderate LVH. Pericardial effusion gone  . UPPER EXTREMITY ANGIOGRAPHY Left 06/27/2016   Procedure: Upper Extremity Angiography;  Surgeon: Algernon Huxley, MD;  Location: [place] CV LAB;  Service: Cardiovascular;  Laterality: Left;  . UPPER EXTREMITY ANGIOGRAPHY Left 08/22/2016   Procedure: Upper Extremity Angiography;  Surgeon: Algernon Huxley, MD;  Location: [place] CV LAB;  Service: Cardiovascular;  Laterality: Left;  Marland Kitchen VASCULAR SURGERY    . WOUND DEBRIDEMENT Left 05/08/2018   Procedure: DEBRIDEMENT OF LEFT LEG BKA;  Surgeon: Algernon Huxley, MD;  Location: [facility];  Service: General;  Laterality: Left;    Current Outpatient Rx  . Order #: [id number] Class: No Print  . Order #: [id number] Class: Print  . Order #: [id number] Class: OTC  . Order #: [id number] Class: Historical Med  . Order #: [id number] Class: Normal  . Order #: [id number] Class: Historical Med  . Order #: [id number] Class: No Print  . Order #: [id number] Class: Print  . Order #: [id number] Class: Normal  . Order #: [id number] Class: No Print  . Order #: [id number] Class: Historical Med  . Order #: [id number] Class: Normal  . Order #: [id number] Class: Historical Med  . Order #: [id number] Class: No Print  . Order #: [id number] Class: No Print  . Order #: [id number] Class: Print  . Order #: [id number] Class: Normal  . Order #:  [id number] Class: Historical Med  . Order #: [id number] Class: Historical Med  . Order #: [id number] Class: Print  . Order #: [id number] Class: Print  . Order #: [id number] Class: Print  . Order #: [id number] Class: Historical Med  . Order #: [id number] Class: Historical Med  . Order #: [id number] Class: Historical Med  . Order #: [id number] Class: No Print  . Order #: [id number] Class: Historical Med  . Order #: [id number] Class: Normal  . Order #: [id number] Class: Print  . Order #: [id number] Class: Historical Med  . Order #: [id number] Class: Normal  . Order #: [id number] Class: Print    Allergies Chlorthalidone; Fentanyl; Midazolam; Ace inhibitors; Angiotensin receptor blockers; Norvasc [amlodipine]; and Phenergan [promethazine hcl]  Family History  Problem Relation Age of Onset  . Diabetes Mellitus II Mother   . Heart failure Mother   . Pancreatic cancer Father     Social  History Social History   Tobacco Use  . Smoking status: Never Smoker  . Smokeless tobacco: Never Used  Substance Use Topics  . Alcohol use: No  . Drug use: No    Review of Systems Unable to obtain due to patient nonverbal. ____________________________________________   PHYSICAL EXAM:  VITAL SIGNS: ED Triage Vitals [07/08/18 1333]  Enc Vitals Group     BP (!) 207/80     Pulse Rate 73     Resp 20     Temp      Temp src      SpO2      Weight      Height      Head Circumference      Peak Flow      Pain Score      Pain Loc      Pain Edu?      Excl. in [place]?     Constitutional: The patient is alert, has her eyes open and is protecting her airway but is non-verbal and unable to follow commands. Eyes: Abnormal pupil shape on the left and the right with minimal responsiveness.   Head: Atraumatic. Nose: No congestion/rhinnorhea. Mouth/Throat: Mucous membranes are mildly dry.  Neck: No stridor.  Supple.  No JVD.  No meningismus. Cardiovascular: Normal rate, regular rhythm. No murmurs, rubs or gallops.  Respiratory:  Normal respiratory effort.  No accessory muscle use or retractions. Lungs CTAB.  No wheezes, rales or ronchi. Gastrointestinal: Soft, nontender and nondistended.  No guarding or rebound.  No peritoneal signs. Musculoskeletal: Bilateral BKA.  L BKA has a large area of open wound with some granulation tissue, but also purulent discharge w/o palpable crepitus or fluctuance; no surrounding erythema or swelling. Neurologic: Alert, but nonverbal and unable to follow commands. Face symmetric and unable to comply with smile.  Abnormal pupils as listed above.  Unable to comply with extraocular movement testing or visual field testing..  The patient does not spontaneously move any of her 4 extremities and does not comply with grip testing.   Skin:  Skin is warm, dry and intact. No rash noted. Psychiatric: Unable to assess due to patient nonverbal  ____________________________________________   LABS (all labs ordered are listed, but only abnormal results are displayed)  Labs Reviewed  PROTIME-INR - Abnormal; Notable for the following components:      Result Value   Prothrombin Time 15.3 (*)    All other components within normal limits  CBC - Abnormal; Notable for the following components:   HCT 47.0 (*)    RDW 22.1 (*)    Platelets 127 (*)    nRBC 0.5 (*)    All other components within normal limits  COMPREHENSIVE METABOLIC PANEL - Abnormal; Notable for the following components:   Chloride 97 (*)    Glucose, Bld 126 (*)    BUN 43 (*)    Creatinine, Ser 6.00 (*)    Calcium 8.0 (*)    GFR calc non Af Amer 7 (*)    GFR calc Af Amer 8 (*)    Anion gap 17 (*)    All other components within normal limits  CULTURE, BLOOD (ROUTINE X 2)  CULTURE, BLOOD (ROUTINE X 2)  ETHANOL  APTT  DIFFERENTIAL  GLUCOSE, CAPILLARY  AMMONIA   ____________________________________________  EKG  ED ECG REPORT I, Anne-Caroline Mariea Clonts, the attending physician, personally viewed and interpreted this ECG.   Date:  07/08/2018  EKG Time: 1329  Rate: 73  Rhythm: normal sinus rhythm  Axis: leftward  Intervals:none  ST&T Change: No STEMI; nonspecific T wave inversion in lead I, 2, V2 through V6.   This EKG is compared to 04/23/2018, which is grossly unchanged from previously in morphology.  ____________________________________________  RADIOLOGY  Ct Head Code Stroke Wo Contrast  Addendum Date: 07/08/2018   ADDENDUM REPORT: 07/08/2018 14:15 ADDENDUM: Study discussed by telephone with Dr. Eula Listen on 07/08/2018 at 1357 hours. Electronically Signed   By: Genevie Ann M.D.   On: 07/08/2018 14:15   Result Date: 07/08/2018 CLINICAL DATA:  Code stroke. 68 year old female with left facial droop and syncope. Last known well 1250 hours. EXAM: CT HEAD WITHOUT CONTRAST TECHNIQUE: Contiguous axial images were obtained from the base of the skull through the vertex without intravenous contrast. COMPARISON:  Head CT 03/17/2018. Brain MRI 03/14/2018 and earlier. FINDINGS: Brain: No midline shift, mass effect, or evidence of intracranial mass lesion. No ventriculomegaly. No acute intracranial hemorrhage identified. New heterogeneity of the left thalamus on series 2, image 14, but this would result in right side symptoms. Gray-white matter differentiation otherwise appears stable. No cortically based acute infarct identified. Vascular: Extensive Calcified atherosclerosis at the skull base. No suspicious intracranial vascular hyperdensity. Skull: Negative. Sinuses/Orbits: Chronic left sphenoid sinusitis, otherwise stable and well pneumatized. Other: No acute orbit or scalp soft tissue findings. There is some calcified scalp vessel atherosclerosis. ASPECTS [facility] Stroke Program Early CT Score) - Ganglionic level infarction (caudate, lentiform nuclei, internal capsule, insula, M1-M3 cortex): 7 - Supraganglionic infarction (M4-M6 cortex): 3 Total score (0-10 with 10 being normal): 10 IMPRESSION: 1. Age indeterminate left thalamic  lacunar infarct is new since November but would result in right side symptoms. 2. No acute cortically based infarct or acute intracranial hemorrhage identified. ASPECTS is 10. 3. Calcified atherosclerosis, including in scalp vessels which can be seen in the setting of in stage renal disease. Electronically Signed: By: Genevie Ann M.D. On: 07/08/2018 13:52    ____________________________________________   PROCEDURES  Procedure(s) performed: None  Procedures  Critical Care performed: Yes, see critical care note(s) ____________________________________________   INITIAL IMPRESSION / ASSESSMENT AND PLAN / ED COURSE  Pertinent labs & imaging results that were available during my care of the patient were reviewed by me and considered in my medical decision making (see chart for details).  68 y.o. female with ESRD on HD presenting with acute his compensation, nonverbal and unable to follow commands, during dialysis.  Overall, the patient is hypertensive at 207/80 but we will re-evaluate need for intervention after her CT scan.  A code stroke has been called and I am concerned about an acute CVA.  The patient is on Plavix only I will check for hemorrhagic stroke symptoms.  Her last time known normal was 1250, which does put her in the window for TPA.  Other etiologies are also possible, including encephalopathy, which she has had in the past.  The inpatient neurologist is at the bedside.  Plan reevaluation for final disposition.  ----------------------------------------- 2:20 PM on 07/08/2018 -----------------------------------------  At this time, the patient's blood pressure has improved after administration of labetalol.  In addition, her clinical symptoms are improving.  She is now moving her eyes and tracking in the room.  She is moving both upper extremities although she continues to be unable to follow any commands and is nonverbal.  The patient CT scan shows a new left-sided lacunar infarct,  which would not account for her symptoms today.  She has no evidence of hemorrhage.  I have  had a long discussion with her daughter, Ms. Sheran Spine, who is her POA.  We have discussed the risks and benefits of TPA, and at this time she wishes to proceed with administering TPA.  The patient does have a left BKA stump which has required revision, last in late January.  We had an at length discussion of the risks of bleeding with TPA, as well as improvement or no change.  I have spoken with Dr. Irish Elders, and the patient will be admitted to the ICU here.  She will not be a candidate for any intra-arterial procedures, and will be closely monitored.  Given the purulent discharge that I am seeing on her stump, I will also initiate her on clindamycin; I do not feel that her stump infection is the cause of her altered mental status today, but we will attend to it as well.  Patient does have a history of seizures, and seizure precautions will be taken.  ----------------------------------------- 3:02 PM on 07/08/2018 -----------------------------------------  The paperwork that arrived from dialysis with the patient had a thorough medication list which stated Plavix and aspirin as her medications.  There was no listed anticoagulant.  However, the neurology nurse has called the facility, and the patient does have a history of A. fib and was given her last dose of apixaban this morning.  At this time, the patient continues to improve, we have turned off the TPA and the neurologist is aware.  The neurologist has confirmed that he will update the patient's POA of this event.   CRITICAL CARE Performed by: Eula Listen   Total critical care time: 45 minutes  Critical care time was exclusive of separately billable procedures and treating other patients.  Critical care was necessary to treat or prevent imminent or life-threatening deterioration.  Critical care was time spent personally by me on the  following activities: development of treatment plan with patient and/or surrogate as well as nursing, discussions with consultants, evaluation of patient's response to treatment, examination of patient, obtaining history from patient or surrogate, ordering and performing treatments and interventions, ordering and review of laboratory studies, ordering and review of radiographic studies, pulse oximetry and re-evaluation of patient's condition.   ____________________________________________  FINAL CLINICAL IMPRESSION(S) / ED DIAGNOSES  Final diagnoses:  Acute CVA (cerebrovascular accident) ([place])  Wound infection  Hypertension, unspecified type         NEW MEDICATIONS STARTED DURING THIS VISIT:  New Prescriptions   No medications on file      Eula Listen, MD 07/08/18 1351    Eula Listen, MD 07/08/18 1425    Eula Listen, MD 07/08/18 [id number]

## 2018-07-08 NOTE — Consult Note (Signed)
Pharmacy Antibiotic Note  Stephanie Ellis is a 68 y.o. female admitted on 07/08/2018 with stroke but thought to have a BKA wound.  Cipro is also being started due to possible bacteremia given previous culture results. 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 Pharmacy has been consulted for vancomycin and ciprofloxacin dosing.  Plan: 1) Vancomycin 500 mg IV every Tu-Thurs-Sat with HD after initial loading dose of 1g.  Target pre-HD level is 15-25 mcg/mL.  Labs will be drawn prior to 3rd HD session.  2) Ciprofloxacin 400 mg q 24 hours   Weight: 114 lb 3.2 oz (51.8 kg)  No data recorded.  Recent Labs  Lab 07/08/18 1357  WBC 8.4  CREATININE 6.00*    Estimated Creatinine Clearance: 7.4 mL/min (A) (by C-G formula based on SCr of 6 mg/dL (H)).    Allergies  Allergen Reactions  . Chlorthalidone Anaphylaxis, Itching and Rash  . Fentanyl Rash  . Midazolam Rash  . Ace Inhibitors Other (See Comments)    Reaction:  Hyperkalemia, agitation   . Angiotensin Receptor Blockers Other (See Comments)    Reaction:  Hyperkalemia, agitation   . Norvasc [Amlodipine] Itching and Rash  . Phenergan [Promethazine Hcl] Anxiety    "antsy, can't sit still"    Antimicrobials this admission: Vancomycin 3/18 >>  Ciprofloxaxin 3/18 >>   Microbiology results: 1/11 BCx: pseudomonas(+) in the blood  3/18 BCx: pending 3/18 MRSA PCR: pending  Thank you for allowing pharmacy to be a part of this patient's care.  Pearla Dubonnet 07/08/2018 3:42 PM

## 2018-07-08 NOTE — ED Notes (Signed)
Code stroke called at 1338.

## 2018-07-08 NOTE — ED Notes (Signed)
neurologisty Zeylikman at bedside.Patient not following commands at this time.

## 2018-07-08 NOTE — ED Triage Notes (Signed)
Pt arrived at ED from dialysis via [facility] with c/c of altered mental status. EMS reports that patient was minimally responsive upon their arrival at the scene. Dialysis staff reports that her baseline is normally alert and oriented. During dialysis she lost consciousness. Transport vitals reported as 187/79, p 74, r 16, O2 sat 96%, CBG 109, NSR. Pt arrives to ED awake but non verbal. She tracks to voice and nods when questioned. Pt NAD. Dialysis through port but old fistula on L side.

## 2018-07-08 NOTE — ED Notes (Signed)
Ed provider at bedside

## 2018-07-08 NOTE — Progress Notes (Addendum)
Neurology:  Pt appears to have been on renal dose eliquis.  TPA stopped.  Pt is clinically doing well. No need for reversal.  Daughter called and updated phone [phone]).    Pt is going to MRI will add GRE sequence.   Leotis Pain

## 2018-07-08 NOTE — Progress Notes (Signed)
[facility]  ROUNDING NOTE   Subjective:  Patient was at dialysis earlier today and came in with altered mental status and aphasia. Blood pressure was noted to be quite high on presentation. Code stroke was called. Patient did receive bolus of TPA. Upon my evaluation it appears that she is arousable and mildly conversant. However she remains quite confused and does not know who she is. MRI brain negative.  Objective:  Vital signs in last 24 hours:  Temp:  [97.8 F (36.6 C)] 97.8 F (36.6 C) (03/18 1705) Pulse Rate:  [32-98] 72 (03/18 1730) Resp:  [12-26] 17 (03/18 1730) BP: (115-207)/(47-105) 181/52 (03/18 1730) SpO2:  [92 %-100 %] 99 % (03/18 1730) Weight:  [51.8 kg] 51.8 kg (03/18 1351)  Weight change:  Filed Weights   07/08/18 1351  Weight: 51.8 kg    Intake/Output: No intake/output data recorded.   Intake/Output this shift:  Total I/O In: 1.6 [I.V.:1.6] Out: -   Physical Exam: General: No acute distress  Head: Normocephalic, atraumatic. Moist oral mucosal membranes  Eyes: Anicteric  Neck: Supple, trachea midline  Lungs:  Clear to auscultation, normal effort  Heart: S1S2 no rubs  Abdomen:  Soft, nontender, bowel sounds present  Extremities: B/L BKA  Neurologic: Awake, alert, following commands  Skin: No lesions  Access: IJ permcath    Basic Metabolic Panel: Recent Labs  Lab 07/08/18 1357  NA 137  K 4.4  CL 97*  CO2 23  GLUCOSE 126*  BUN 43*  CREATININE 6.00*  CALCIUM 8.0*    Liver Function Tests: Recent Labs  Lab 07/08/18 1357  AST 22  ALT 17  ALKPHOS 85  BILITOT 0.4  PROT 7.2  ALBUMIN 3.5   No results for input(s): LIPASE, AMYLASE in the last 168 hours. No results for input(s): AMMONIA in the last 168 hours.  CBC: Recent Labs  Lab 07/08/18 1357  WBC 8.4  NEUTROABS 6.5  HGB 14.1  HCT 47.0*  MCV 95.1  PLT 127*    Cardiac Enzymes: Recent Labs  Lab 07/08/18 1357  TROPONINI 0.09*    BNP: Invalid input(s):  POCBNP  CBG: Recent Labs  Lab 07/08/18 1331 07/08/18 1544  GLUCAP 97 108*    Microbiology: Results for orders placed or performed during the hospital encounter of 07/08/18  MRSA PCR Screening     Status: Abnormal   Collection Time: 07/08/18  3:42 PM  Result Value Ref Range Status   MRSA by PCR POSITIVE (A) NEGATIVE Final    Comment:        The GeneXpert MRSA Assay (FDA approved for NASAL specimens only), is one component of a comprehensive MRSA colonization surveillance program. It is not intended to diagnose MRSA infection nor to guide or monitor treatment for MRSA infections. RESULT CALLED TO, READ BACK BY AND VERIFIED WITH: DILLON WHITE 07/08/18 @ [place] Performed at [facility], [address] [place], [place] [address]     Coagulation Studies: Recent Labs    07/08/18 1357  LABPROT 15.3*  INR 1.2    Urinalysis: No results for input(s): COLORURINE, LABSPEC, PHURINE, GLUCOSEU, HGBUR, BILIRUBINUR, KETONESUR, PROTEINUR, UROBILINOGEN, NITRITE, LEUKOCYTESUR in the last 72 hours.  Invalid input(s): APPERANCEUR    Imaging: Mr Virgel Paling EY Contrast  Result Date: 07/08/2018 CLINICAL DATA:  Altered mental status. Speech disturbance. Dialysis patient. EXAM: MRI HEAD WITHOUT CONTRAST MRA HEAD WITHOUT CONTRAST TECHNIQUE: Multiplanar, multiecho pulse sequences of the brain and surrounding structures were obtained without intravenous contrast. Angiographic images  of the head were obtained using MRA technique without contrast. COMPARISON:  Head CT same day.  MRI 03/14/2018. FINDINGS: MRI HEAD FINDINGS Brain: The study suffers from some motion degradation. Diffusion imaging does not show any acute or subacute infarction. The brainstem is normal. Few old small vessel cerebellar infarctions. Cerebral hemispheres show age related atrophy with mild to moderate chronic small-vessel ischemic changes of the white matter. Old small vessel thalamic infarction on the left. No  cortical or large vessel territory infarction. No mass lesion, hemorrhage, hydrocephalus or extra-axial collection. Vascular: Major vessels at the base of the brain show flow. Skull and upper cervical spine: Negative Sinuses/Orbits: Sphenoid sinusitis. Other sinuses are clear. Orbits negative. Other: None MRA HEAD FINDINGS Severe motion degradation. Both internal carotid arteries show flow. There is flow in the anterior and middle cerebral vessels. Both vertebral arteries are patent to the basilar. The basilar artery and major posterior circulation branch vessels show flow. Detail beyond that is not reliable. IMPRESSION: No acute brain finding. Chronic small-vessel ischemic changes of the cerebellum, left thalamus and cerebral hemispheric white matter. MR angiogram is markedly degraded by motion. There does not appear to be any large vessel occlusion. Beyond that, detail is not reliable because of motion. Electronically Signed   By: Nelson Chimes M.D.   On: 07/08/2018 17:08   Mr Brain Wo Contrast  Result Date: 07/08/2018 CLINICAL DATA:  Altered mental status. Speech disturbance. Dialysis patient. EXAM: MRI HEAD WITHOUT CONTRAST MRA HEAD WITHOUT CONTRAST TECHNIQUE: Multiplanar, multiecho pulse sequences of the brain and surrounding structures were obtained without intravenous contrast. Angiographic images of the head were obtained using MRA technique without contrast. COMPARISON:  Head CT same day.  MRI 03/14/2018. FINDINGS: MRI HEAD FINDINGS Brain: The study suffers from some motion degradation. Diffusion imaging does not show any acute or subacute infarction. The brainstem is normal. Few old small vessel cerebellar infarctions. Cerebral hemispheres show age related atrophy with mild to moderate chronic small-vessel ischemic changes of the white matter. Old small vessel thalamic infarction on the left. No cortical or large vessel territory infarction. No mass lesion, hemorrhage, hydrocephalus or extra-axial  collection. Vascular: Major vessels at the base of the brain show flow. Skull and upper cervical spine: Negative Sinuses/Orbits: Sphenoid sinusitis. Other sinuses are clear. Orbits negative. Other: None MRA HEAD FINDINGS Severe motion degradation. Both internal carotid arteries show flow. There is flow in the anterior and middle cerebral vessels. Both vertebral arteries are patent to the basilar. The basilar artery and major posterior circulation branch vessels show flow. Detail beyond that is not reliable. IMPRESSION: No acute brain finding. Chronic small-vessel ischemic changes of the cerebellum, left thalamus and cerebral hemispheric white matter. MR angiogram is markedly degraded by motion. There does not appear to be any large vessel occlusion. Beyond that, detail is not reliable because of motion. Electronically Signed   By: Nelson Chimes M.D.   On: 07/08/2018 17:08   Dg Chest Portable 1 View  Result Date: 07/08/2018 CLINICAL DATA:  Altered mental status. EXAM: PORTABLE CHEST 1 VIEW COMPARISON:  04/23/2018. FINDINGS: Stable enlarged cardiac silhouette. Interval mild patchy opacities in both lungs, right greater than left. Stable post CABG changes. Stable left jugular double-lumen catheter with its tip in the mid right atrium. Unremarkable bones. IMPRESSION: 1. Interval mild bilateral alveolar edema or pneumonia, right greater than left. 2. Stable cardiomegaly. Electronically Signed   By: Claudie Revering M.D.   On: 07/08/2018 17:41   Ct Head Code Stroke Wo Contrast  Addendum Date: 07/08/2018   ADDENDUM REPORT: 07/08/2018 14:15 ADDENDUM: Study discussed by telephone with Dr. Eula Listen on 07/08/2018 at 1357 hours. Electronically Signed   By: Genevie Ann M.D.   On: 07/08/2018 14:15   Result Date: 07/08/2018 CLINICAL DATA:  Code stroke. 68 year old female with left facial droop and syncope. Last known well 1250 hours. EXAM: CT HEAD WITHOUT CONTRAST TECHNIQUE: Contiguous axial images were obtained from  the base of the skull through the vertex without intravenous contrast. COMPARISON:  Head CT 03/17/2018. Brain MRI 03/14/2018 and earlier. FINDINGS: Brain: No midline shift, mass effect, or evidence of intracranial mass lesion. No ventriculomegaly. No acute intracranial hemorrhage identified. New heterogeneity of the left thalamus on series 2, image 14, but this would result in right side symptoms. Gray-white matter differentiation otherwise appears stable. No cortically based acute infarct identified. Vascular: Extensive Calcified atherosclerosis at the skull base. No suspicious intracranial vascular hyperdensity. Skull: Negative. Sinuses/Orbits: Chronic left sphenoid sinusitis, otherwise stable and well pneumatized. Other: No acute orbit or scalp soft tissue findings. There is some calcified scalp vessel atherosclerosis. ASPECTS [facility] Stroke Program Early CT Score) - Ganglionic level infarction (caudate, lentiform nuclei, internal capsule, insula, M1-M3 cortex): 7 - Supraganglionic infarction (M4-M6 cortex): 3 Total score (0-10 with 10 being normal): 10 IMPRESSION: 1. 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 2. No acute cortically based infarct or acute intracranial hemorrhage identified. ASPECTS is 10. 3. Calcified atherosclerosis, including in scalp vessels which can be seen in the setting of in stage renal disease. Electronically Signed: By: Genevie Ann M.D. On: 07/08/2018 13:52     Medications:   . sodium chloride Stopped (07/08/18 1614)  . famotidine (PEPCID) IV    . levETIRAcetam 750 mg (07/08/18 1747)  . [START ON 07/09/2018] levETIRAcetam    . niCARdipine in saline     . [START ON 07/09/2018] atorvastatin  80 mg Oral QHS  . brimonidine  1 drop Both Eyes TID  . [START ON 07/09/2018] calcitRIOL  0.5 mcg Oral Daily  . [START ON 07/09/2018] Chlorhexidine Gluconate Cloth  6 each Topical Q0600  . [START ON 07/09/2018] mirtazapine  7.5  mg Oral QHS  . mupirocin ointment  1 application Nasal BID  . polyvinyl alcohol  1 drop Both Eyes BID  . [START ON 07/09/2018] sevelamer carbonate  800 mg Oral TID WC  . timolol  1 drop Both Eyes TID   sodium chloride, acetaminophen, labetalol  Assessment/ Plan:  68 y.o. female with end stage renal disease on hemodialysis, hypertension, congestive heart failure, coronary artery disease status post CABG, diabetes mellitus type II insulin dependent, GERD, peripheral vascular disease status post right BKA, history of endocarditis and osteomyelitis  [facility] MWF hemodialysis left IJ permcath  1.  ESRD on HD MWF:  Patient had partial HD treatment today when she developed unresponsiveness and aphasia. No indication for any additional HD at the moment, will reevaluate tomorrow.  2.  Altered mental status: no definitive CVA on MRI.  Could be hypertensive encephalopathy.  Starting to talk again but confused.  3.  Hypertension:  Nicardipine gtt ordered.  4.  Secondary hyperparathyroidism:  renvela ordered, consider swallow study.     LOS: 0 Akashdeep Chuba 3/18/20205:58 PM

## 2018-07-08 NOTE — ED Notes (Signed)
Stephanie Ellis reports start another line an hour after TPA

## 2018-07-09 ENCOUNTER — Inpatient Hospital Stay: Payer: Medicare Other

## 2018-07-09 ENCOUNTER — Other Ambulatory Visit: Payer: Self-pay

## 2018-07-09 ENCOUNTER — Inpatient Hospital Stay (HOSPITAL_COMMUNITY)
Admit: 2018-07-09 | Discharge: 2018-07-09 | Disposition: A | Discharge status: Home / Self Care | Payer: Medicare Other | Attending: Internal Medicine | Admitting: Internal Medicine

## 2018-07-09 ENCOUNTER — Other Ambulatory Visit: Payer: Medicare Other

## 2018-07-09 DIAGNOSIS — I7789 Other specified disorders of arteries and arterioles: Secondary | ICD-10-CM

## 2018-07-09 DIAGNOSIS — E119 Type 2 diabetes mellitus without complications: Secondary | ICD-10-CM

## 2018-07-09 DIAGNOSIS — I340 Nonrheumatic mitral (valve) insufficiency: Secondary | ICD-10-CM

## 2018-07-09 DIAGNOSIS — Z89512 Acquired absence of left leg below knee: Secondary | ICD-10-CM

## 2018-07-09 DIAGNOSIS — Z7982 Long term (current) use of aspirin: Secondary | ICD-10-CM

## 2018-07-09 DIAGNOSIS — I639 Cerebral infarction, unspecified: Secondary | ICD-10-CM

## 2018-07-09 DIAGNOSIS — Z79899 Other long term (current) drug therapy: Secondary | ICD-10-CM

## 2018-07-09 DIAGNOSIS — E785 Hyperlipidemia, unspecified: Secondary | ICD-10-CM

## 2018-07-09 DIAGNOSIS — Z7901 Long term (current) use of anticoagulants: Secondary | ICD-10-CM

## 2018-07-09 DIAGNOSIS — Z89511 Acquired absence of right leg below knee: Secondary | ICD-10-CM

## 2018-07-09 LAB — ECHOCARDIOGRAM COMPLETE: Weight: 1827.17 oz

## 2018-07-09 LAB — MAGNESIUM: Magnesium: 2.2 mg/dL (ref 1.7–2.4)

## 2018-07-09 LAB — AMMONIA: Ammonia: <9 umol/L — ABNORMAL LOW (ref 9–35)

## 2018-07-09 LAB — BASIC METABOLIC PANEL
Anion gap: 16 — ABNORMAL HIGH (ref 5–15)
BUN: 61 mg/dL — ABNORMAL HIGH (ref 8–23)
CO2: 23 mmol/L (ref 22–32)
Calcium: 8.1 mg/dL — ABNORMAL LOW (ref 8.9–10.3)
Chloride: 99 mmol/L (ref 98–111)
Creatinine, Ser: 7.17 mg/dL — ABNORMAL HIGH (ref 0.44–1.00)
GFR calc Af Amer: 6 mL/min — ABNORMAL LOW (ref >60)
GFR calc non Af Amer: 5 mL/min — ABNORMAL LOW (ref >60)
Glucose, Bld: 83 mg/dL (ref 70–99)
Potassium: 4.8 mmol/L (ref 3.5–5.1)
Sodium: 138 mmol/L (ref 135–145)

## 2018-07-09 LAB — CBC
HCT: 43.7 % (ref 36.0–46.0)
Hemoglobin: 13.1 g/dL (ref 12.0–15.0)
MCH: 28.7 pg (ref 26.0–34.0)
MCHC: 30 g/dL (ref 30.0–36.0)
MCV: 95.6 fL (ref 80.0–100.0)
Platelets: 128 10*3/uL — ABNORMAL LOW (ref 150–400)
RBC: 4.57 MIL/uL (ref 3.87–5.11)
RDW: 21.9 % — ABNORMAL HIGH (ref 11.5–15.5)
WBC: 7.6 10*3/uL (ref 4.0–10.5)
nRBC: 0 % (ref 0.0–0.2)

## 2018-07-09 LAB — GLUCOSE, CAPILLARY: Glucose-Capillary: 285 mg/dL — ABNORMAL HIGH (ref 70–99)

## 2018-07-09 LAB — TROPONIN I: Troponin I: 0.15 ng/mL (ref <0.03)

## 2018-07-09 MED ORDER — LEVETIRACETAM 250 MG PO TABS
250.0000 mg | ORAL_TABLET | ORAL | Status: DC
  Administered 2018-07-11: 250 mg via ORAL
  Filled 2018-07-09 (×2): qty 1

## 2018-07-09 MED ORDER — LEVETIRACETAM 500 MG PO TABS
500.0000 mg | ORAL_TABLET | Freq: Two times a day (BID) | ORAL | Status: DC
  Administered 2018-07-10 – 2018-07-13 (×7): 500 mg via ORAL
  Filled 2018-07-09 (×8): qty 1

## 2018-07-09 MED ORDER — INSULIN ASPART 100 UNIT/ML ~~LOC~~ SOLN
0.0000 [IU] | Freq: Three times a day (TID) | SUBCUTANEOUS | Status: DC
  Administered 2018-07-10: 2 [IU] via SUBCUTANEOUS
  Filled 2018-07-09: qty 1

## 2018-07-09 MED ORDER — INSULIN ASPART 100 UNIT/ML ~~LOC~~ SOLN
0.0000 [IU] | Freq: Every day | SUBCUTANEOUS | Status: DC
  Administered 2018-07-09: 3 [IU] via SUBCUTANEOUS
  Filled 2018-07-09: qty 1

## 2018-07-09 NOTE — Progress Notes (Addendum)
SLP Cancellation Note  Patient Details Name: Shikara Mcauliffe MRN: [id number] DOB: 1950-08-20   Cancelled treatment:       Reason Eval/Treat Not Completed: SLP screened, no needs identified, will sign off(chart reviewed; consulted NSG then met w/ pt ).  Pt denied any difficulty swallowing and is currently on a regular diet; tolerates there Regular diet and swallowing pills w/ water per NSG report. Pt conversed w/ SLP w/out overt deficits noted; pt odes continued to exhibit min confusion w/ staff since admission and has refused some txs. Speech clear. She picked items from the menu for her meals allowing SLP to place orders.   No further skilled ST services indicated for swallowing as pt appears at her baseline. Recommend f/u w/ Neurology for formal assessment if change in mental status from her baseline continues at discharge. Pt agreed. NSG to reconsult if any change in status while admitted.     Orinda Kenner, MS, CCC-SLP Watson,Katherine 07/09/2018, 10:59 AM

## 2018-07-09 NOTE — Progress Notes (Signed)
CRITICAL CARE NOTE        SUBJECTIVE FINDINGS & SIGNIFICANT EVENTS   Patient mentation significantly improved this a.m.  Able to answer all questions appropriately without encouragement.  Status post eval from renal team this a.m.  -Left BKA -+VRE-vasc surg on case -s/p US carotid -CTH repeat pending -TTE-pending - neuromonitoring period in progress - PAST MEDICAL HISTORY   Past Medical History:  Diagnosis Date   (HFpEF) heart failure with preserved ejection fraction ([place])    a. 01/2018 Echo: EF 50-55%, no rwma, Gr1 DD, triv AI, mild MR. Midly dil LA/RV, mod reduced RV fxn. Irreg thickening of TV w/ mobile echodensity that appears to arise from valve.   Anemia    Anorexia    Bacteremia due to Pseudomonas    Carotid arterial disease ([place])    a. 01/2017 Carotid U/S: RICA <19, LICA <14.   Complication of anesthesia    a. Pt reports h/o complication on 9 different occasions - ? hypotension/arrest.   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 01/2015 Cath [facility]): 70% LM, p-mLAD 50-60% (Resting FFR 0.75), mRCA 80-90%, ~40 Ost OM & D1-->CABG; b. 01/2016 Staged PCI of LCX x 2 and RCA.   ESRD (end stage renal disease) on dialysis ([place])    a. ESRD secondary to acute kidney failure s/p CABG-->PD   Essential hypertension    GERD (gastroesophageal reflux disease)    Heart murmur    Hypercholesterolemia    Myocardial infarction ([place]) 2016   PVD (peripheral vascular disease) ([place])    a. 01/30/2018 PV Angio: Sev Left Popliteal dzs s/p PTA w/ 66m lutonix DEB w/ mech aspiration of the L popliteal, L AT, and Left tibioperoneal trunck and peroneal artery.   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  [facility]: Dr. BMarland KitchenHaithcock: CABG x 3, LIMA to LAD, SVG to RCA, SVG to OM3, EVH   Sinusitis  2019   Type II diabetes mellitus with complication ([place]    CAD     SURGICAL HISTORY   Past Surgical History:  Procedure Laterality Date   ABDOMINAL HYSTERECTOMY     AMPUTATION Right 02/25/2018   Procedure: AMPUTATION BELOW KNEE;  Surgeon: SKatha Cabal MD;  Location: [facility] OR[facility];  Service: Vascular;  Laterality: Right;   AMPUTATION Left 03/11/2018   Procedure: AMPUTATION BELOW KNEE;  Surgeon: DAlgernon Huxley MD;  Location: [facility];  Service: Vascular;  Laterality: Left;   AMPUTATION Bilateral 04/13/2018   Procedure: REVISION OF BILATERAL AMPUTATIONS BELOW KNEE WITH WOUND VAC APPLICATIONS;  Surgeon: EEvaristo Bury MD;  Location: [facility];  Service: Vascular;  Laterality: Bilateral;   AMPUTATION TOE Left 02/06/2018   Procedure: AMPUTATION GREAT TOE;  Surgeon: FSamara Deist DPM;  Location: [facility];  Service: Podiatry;  Laterality: Left;   APPLICATION OF WOUND VAC Left 04/13/2018   Procedure: APPLICATION OF WOUND VAC;  Surgeon: EEvaristo Bury MD;  Location: [facility];  Service: Vascular;  Laterality: Left;   ARTERIOVENOUS GRAFT PLACEMENT  05/2016   AV FISTULA PLACEMENT Left 05/30/2016   Procedure: ARTERIOVENOUS graft;  Surgeon: JAlgernon Huxley MD;  Location: [facility];  Service: Vascular;  Laterality: Left;   CAPD INSERTION N/A 10/30/2017   Procedure: LAPAROSCOPIC INSERTION CONTINUOUS AMBULATORY PERITONEAL DIALYSIS  (CAPD) CATHETER;  Surgeon: DAlgernon Huxley MD;  Location: [facility];  Service: Vascular;  Laterality: N/A;   CARDIAC CATHETERIZATION  01/2015   [place] Ost LM 70%, p-m LAD 50-60% (Rest FFR + @  0.75), mRCA 80-90%, ostD1 40%, pOM1 40%   CATARACT EXTRACTION W/PHACO Left 01/18/2016   Procedure: CATARACT EXTRACTION PHACO AND INTRAOCULAR LENS PLACEMENT (IOC);  Surgeon: Eulogio Bear, MD;  Location: [facility];  Service: Ophthalmology;  Laterality: Left;  [place] 1.05 AP% 15.5 CDE 10.16 Fluid Pack Lot # [id number]   CATARACT EXTRACTION W/PHACO Right 08/01/2016   Procedure:  CATARACT EXTRACTION PHACO AND INTRAOCULAR LENS PLACEMENT ([place]);  Surgeon: Eulogio Bear, MD;  Location: [facility];  Service: Ophthalmology;  Laterality: Right;  [place] 01:00.6 AP% 11.4 CDE 6.93  LOT # [id number]   COLONOSCOPY     CORONARY ANGIOPLASTY     SENTS 02/12/16   CORONARY ARTERY BYPASS GRAFT  03/28/15    [facility]: Dr. Waldemar Dickens: LIMA to LAD, SVG to RCA, SVG to OM3, [facility]   DIALYSIS/PERMA CATHETER INSERTION N/A 02/11/2018   Procedure: DIALYSIS/PERMA CATHETER INSERTION;  Surgeon: Algernon Huxley, MD;  Location: [place] [facility] LAB;  Service: Cardiovascular;  Laterality: N/A;   DIALYSIS/PERMA CATHETER REMOVAL Right 02/04/2018   Procedure: DIALYSIS/PERMA CATHETER REMOVAL;  Surgeon: Katha Cabal, MD;  Location: [place] CV LAB;  Service: Cardiovascular;  Laterality: Right;   ESOPHAGOGASTRODUODENOSCOPY (EGD) WITH PROPOFOL N/A 10/24/2016   Procedure: ESOPHAGOGASTRODUODENOSCOPY (EGD) WITH PROPOFOL;  Surgeon: [name], MD;  Location: [facility];  Service: Endoscopy;  Laterality: N/A;   EYE SURGERY Bilateral    cataract surgery   EYE SURGERY     drains for glaucoma   INSERTION EXPRESS TUBE SHUNT Right 08/01/2016   Procedure: INSERTION EXPRESS TUBE SHUNT;  Surgeon: Eulogio Bear, MD;  Location: [facility];  Service: Ophthalmology;  Laterality: Right;   INSERTION OF AHMED VALVE Left 08/15/2016   Procedure: INSERTION OF AHMED VALVE;  Surgeon: Eulogio Bear, MD;  Location: [facility];  Service: Ophthalmology;  Laterality: Left;   INSERTION OF DIALYSIS CATHETER     LOWER EXTREMITY ANGIOGRAPHY Right 02/19/2018   Procedure: Lower Extremity Angiography;  Surgeon: Algernon Huxley, MD;  Location: [place] CV LAB;  Service: Cardiovascular;  Laterality: Right;   LOWER EXTREMITY ANGIOGRAPHY Left 01/30/2018   Procedure: Lower Extremity Angiography;  Surgeon: Katha Cabal, MD;  Location: [place] CV LAB;  Service: Cardiovascular;  Laterality: Left;   REMOVAL OF A  DIALYSIS CATHETER N/A 02/25/2018   Procedure: REMOVAL OF A DIALYSIS CATHETER;  Surgeon: Katha Cabal, MD;  Location: [facility];  Service: Vascular;  Laterality: N/A;   TEMPORARY DIALYSIS CATHETER N/A 02/06/2018   Procedure: TEMPORARY DIALYSIS CATHETER;  Surgeon: Katha Cabal, MD;  Location: [place] [facility] LAB;  Service: Cardiovascular;  Laterality: N/A;   TRANSTHORACIC ECHOCARDIOGRAM  January 2017    EF 60-65%. GR 2 DD. Mild degenerative mitral valve disease but no prolapse or regurgitation. Mild left atrial dilation. Mild to moderate LVH. Pericardial effusion gone   UPPER EXTREMITY ANGIOGRAPHY Left 06/27/2016   Procedure: Upper Extremity Angiography;  Surgeon: Algernon Huxley, MD;  Location: [place] CV LAB;  Service: Cardiovascular;  Laterality: Left;   UPPER EXTREMITY ANGIOGRAPHY Left 08/22/2016   Procedure: Upper Extremity Angiography;  Surgeon: Algernon Huxley, MD;  Location: [place] CV LAB;  Service: Cardiovascular;  Laterality: Left;   VASCULAR SURGERY     WOUND DEBRIDEMENT Left 05/08/2018   Procedure: DEBRIDEMENT OF LEFT LEG BKA;  Surgeon: Algernon Huxley, MD;  Location: [facility];  Service: General;  Laterality: Left;     FAMILY HISTORY   Family History  Problem Relation Age of Onset  Diabetes Mellitus II Mother    Heart failure Mother    Pancreatic cancer Father      SOCIAL HISTORY   Social History   Tobacco Use   Smoking status: Never Smoker   Smokeless tobacco: Never Used  Substance Use Topics   Alcohol use: No   Drug use: No     MEDICATIONS   Current Medication:  Current Facility-Administered Medications:    0.9 %  sodium chloride infusion, , Intravenous, PRN, Loletha Grayer, MD, Stopped at 07/08/18 1747   acetaminophen (TYLENOL) tablet 650 mg, 650 mg, Oral, Q6H PRN, Leslye Peer, Richard, MD   atorvastatin (LIPITOR) tablet 80 mg, 80 mg, Oral, QHS, Wieting, Richard, MD   brimonidine (ALPHAGAN) 0.2 % ophthalmic solution 1 drop, 1 drop,  Both Eyes, TID, Wieting, Richard, MD, 1 drop at 07/08/18 2311   calcitRIOL (ROCALTROL) capsule 0.5 mcg, 0.5 mcg, Oral, Daily, Wieting, Richard, MD   Chlorhexidine Gluconate Cloth 2 % PADS 6 each, 6 each, Topical, Q0600, Ottie Glazier, MD   famotidine (PEPCID) IVPB 20 mg premix, 20 mg, Intravenous, QHS, Charlett Nose, RPH, Stopped at 07/08/18 2336   hydrALAZINE (APRESOLINE) injection 10 mg, 10 mg, Intravenous, Q4H PRN, Darel Hong D, NP   labetalol (NORMODYNE,TRANDATE) injection 10 mg, 10 mg, Intravenous, Q2H PRN, Loletha Grayer, MD, 10 mg at 07/09/18 0408   levETIRAcetam (KEPPRA) IVPB 500 mg/100 mL premix, 500 mg, Intravenous, Q12H, Charlett Nose, RPH   mirtazapine (REMERON) tablet 7.5 mg, 7.5 mg, Oral, QHS, Wieting, Richard, MD   mupirocin ointment (BACTROBAN) 2 % 1 application, 1 application, Nasal, BID, Susie Ehresman, MD   polyvinyl alcohol (LIQUIFILM TEARS) 1.4 % ophthalmic solution 1 drop, 1 drop, Both Eyes, BID, Wieting, Richard, MD, 1 drop at 07/08/18 2312   sevelamer carbonate (RENVELA) tablet 800 mg, 800 mg, Oral, TID WC, Wieting, Richard, MD, 800 mg at 07/09/18 0824   timolol (TIMOPTIC) 0.5 % ophthalmic solution 1 drop, 1 drop, Both Eyes, TID, Wieting, Richard, MD, 1 drop at 07/08/18 2312    ALLERGIES   Chlorthalidone; Fentanyl; Midazolam; Ace inhibitors; Angiotensin receptor blockers; Norvasc [amlodipine]; and Phenergan [promethazine hcl]    REVIEW OF SYSTEMS    10 system ROS conducted and is negative except  subjective findings  PHYSICAL EXAMINATION   Vitals:   07/09/18 0800 07/09/18 0900  BP: (!) 151/93 (!) 147/85  Pulse: 70 74  Resp: 15   Temp: 36.4 C   SpO2: 96% 100%    GENERAL: No acute distress HEAD: Normocephalic, atraumatic.  EYES: Pupils equal, round, reactive to light.  No scleral icterus.  MOUTH: Moist mucosal membrane. NECK: Supple. No thyromegaly. No nodules. No JVD.  PULMONARY: Clear to auscultation bilaterally, no  wheezing, no rhonchi, no crackles CARDIOVASCULAR: S1 and S2. Regular rate and rhythm. No murmurs, rubs, or gallops.  GASTROINTESTINAL: Soft, nontender, non-distended. No masses. Positive bowel sounds. No hepatosplenomegaly.  MUSCULOSKELETAL: No swelling, clubbing, or edema. S/p b/l amputation NEUROLOGIC: Mild distress due to acute illness SKIN:intact,warm,dry   LABS AND IMAGING     -I personally reviewed most recent blood work, imaging and microbiology - significant findings today are CKD stage IV, thrombocytopenia  LAB RESULTS: Recent Labs  Lab 07/08/18 1357 07/09/18 0409  NA 137 138  K 4.4 4.8  CL 97* 99  CO2 23 23  BUN 43* 61*  CREATININE 6.00* 7.17*  GLUCOSE 126* 83   Recent Labs  Lab 07/08/18 1357 07/09/18 0409  HGB 14.1 13.1  HCT 47.0* 43.7  WBC 8.4 7.6  PLT 127* 128*     IMAGING RESULTS: Mr Virgel Paling XN Contrast  Result Date: 07/08/2018 CLINICAL DATA:  Altered mental status. Speech disturbance. Dialysis patient. EXAM: MRI HEAD WITHOUT CONTRAST MRA HEAD WITHOUT CONTRAST TECHNIQUE: Multiplanar, multiecho pulse sequences of the brain and surrounding structures were obtained without intravenous contrast. Angiographic images of the head were obtained using MRA technique without contrast. COMPARISON:  Head CT same day.  MRI 03/14/2018. FINDINGS: MRI HEAD FINDINGS Brain: The study suffers from some motion degradation. Diffusion imaging does not show any acute or subacute infarction. The brainstem is normal. Few old small vessel cerebellar infarctions. Cerebral hemispheres show age related atrophy with mild to moderate chronic small-vessel ischemic changes of the white matter. Old small vessel thalamic infarction on the left. No cortical or large vessel territory infarction. No mass lesion, hemorrhage, hydrocephalus or extra-axial collection. Vascular: Major vessels at the base of the brain show flow. Skull and upper cervical spine: Negative Sinuses/Orbits: Sphenoid sinusitis.  Other sinuses are clear. Orbits negative. Other: None MRA HEAD FINDINGS Severe motion degradation. Both internal carotid arteries show flow. There is flow in the anterior and middle cerebral vessels. Both vertebral arteries are patent to the basilar. The basilar artery and major posterior circulation branch vessels show flow. Detail beyond that is not reliable. IMPRESSION: No acute brain finding. Chronic small-vessel ischemic changes of the cerebellum, left thalamus and cerebral hemispheric white matter. MR angiogram is markedly degraded by motion. There does not appear to be any large vessel occlusion. Beyond that, detail is not reliable because of motion. Electronically Signed   By: Nelson Chimes M.D.   On: 07/08/2018 17:08   Mr Brain Wo Contrast  Result Date: 07/08/2018 CLINICAL DATA:  Altered mental status. Speech disturbance. Dialysis patient. EXAM: MRI HEAD WITHOUT CONTRAST MRA HEAD WITHOUT CONTRAST TECHNIQUE: Multiplanar, multiecho pulse sequences of the brain and surrounding structures were obtained without intravenous contrast. Angiographic images of the head were obtained using MRA technique without contrast. COMPARISON:  Head CT same day.  MRI 03/14/2018. FINDINGS: MRI HEAD FINDINGS Brain: The study suffers from some motion degradation. Diffusion imaging does not show any acute or subacute infarction. The brainstem is normal. Few old small vessel cerebellar infarctions. Cerebral hemispheres show age related atrophy with mild to moderate chronic small-vessel ischemic changes of the white matter. Old small vessel thalamic infarction on the left. No cortical or large vessel territory infarction. No mass lesion, hemorrhage, hydrocephalus or extra-axial collection. Vascular: Major vessels at the base of the brain show flow. Skull and upper cervical spine: Negative Sinuses/Orbits: Sphenoid sinusitis. Other sinuses are clear. Orbits negative. Other: None MRA HEAD FINDINGS Severe motion degradation. Both  internal carotid arteries show flow. There is flow in the anterior and middle cerebral vessels. Both vertebral arteries are patent to the basilar. The basilar artery and major posterior circulation branch vessels show flow. Detail beyond that is not reliable. IMPRESSION: No acute brain finding. Chronic small-vessel ischemic changes of the cerebellum, left thalamus and cerebral hemispheric white matter. MR angiogram is markedly degraded by motion. There does not appear to be any large vessel occlusion. Beyond that, detail is not reliable because of motion. Electronically Signed   By: Nelson Chimes M.D.   On: 07/08/2018 17:08   Dg Chest Portable 1 View  Result Date: 07/08/2018 CLINICAL DATA:  Altered mental status. EXAM: PORTABLE CHEST 1 VIEW COMPARISON:  04/23/2018. FINDINGS: Stable enlarged cardiac silhouette. Interval mild patchy opacities in both lungs, right greater than left. Stable post CABG  changes. Stable left jugular double-lumen catheter with its tip in the mid right atrium. Unremarkable bones. IMPRESSION: 1. Interval mild bilateral alveolar edema or pneumonia, right greater than left. 2. Stable cardiomegaly. Electronically Signed   By: Claudie Revering M.D.   On: 07/08/2018 17:41   Ct Head Code Stroke Wo Contrast  Addendum Date: 07/08/2018   ADDENDUM REPORT: 07/08/2018 14:15 ADDENDUM: Study discussed by telephone with Dr. Eula Listen on 07/08/2018 at 1357 hours. Electronically Signed   By: Genevie Ann M.D.   On: 07/08/2018 14:15   Result Date: 07/08/2018 CLINICAL DATA:  Code stroke. 68 year old female with left facial droop and syncope. Last known well 1250 hours. EXAM: CT HEAD WITHOUT CONTRAST TECHNIQUE: Contiguous axial images were obtained from the base of the skull through the vertex without intravenous contrast. COMPARISON:  Head CT 03/17/2018. Brain MRI 03/14/2018 and earlier. FINDINGS: Brain: No midline shift, mass effect, or evidence of intracranial mass lesion. No ventriculomegaly. No  acute intracranial hemorrhage identified. New heterogeneity of the left thalamus on series 2, image 14, but this would result in right side symptoms. Gray-white matter differentiation otherwise appears stable. No cortically based acute infarct identified. Vascular: Extensive Calcified atherosclerosis at the skull base. No suspicious intracranial vascular hyperdensity. Skull: Negative. Sinuses/Orbits: Chronic left sphenoid sinusitis, otherwise stable and well pneumatized. Other: No acute orbit or scalp soft tissue findings. There is some calcified scalp vessel atherosclerosis. ASPECTS [facility] Stroke Program Early CT Score) - Ganglionic level infarction (caudate, lentiform nuclei, internal capsule, insula, M1-M3 cortex): 7 - Supraganglionic infarction (M4-M6 cortex): 3 Total score (0-10 with 10 being normal): 10 IMPRESSION: 1. 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 2. No acute cortically based infarct or acute intracranial hemorrhage identified. ASPECTS is 10. 3. Calcified atherosclerosis, including in scalp vessels which can be seen in the setting of in stage renal disease. Electronically Signed: By: Genevie Ann M.D. On: 07/08/2018 13:52      ASSESSMENT AND PLAN    -Multidisciplinary rounds held today  Acute encephalopathy:    -resolved  Hx: Seizure Disorder  Neurology consulted appreciate input-s/p tPA for iCVA-neuromonitoring in progress  -brain imaging reassuring with chronic stable findings -US carotid-pending -repeat CTH-pending -s/p TTE-report pending -Monitor for s/sx of bleeding and transfuse for hgb <7 or active signs of bleeding  Continue keppra   Accelerated Hypertension  - resolved, likey d/t TIA Hx: Atrial Fibrillation, Chronic Diastolic CHF, PVD, and CABG x3 Continuous telemetry monitoring  Continue statin    ESRD on HD  Trend BMP  Replace electrolytes as indicated  Avoid nephrotoxic medications    Nephrology consulted appreciate input-HD per recommendations     left stump infection  pseudomonas aeruginosa-completed course of antibiotics per ID recommendations) Vascular surgery consulted appreciate input- continue vac no surg plan  GI/Nutrition GI PROPHYLAXIS as indicated DIET-->TF's as tolerated Constipation protocol as indicated  ENDO - ICU hypoglycemic\Hyperglycemia protocol -check FSBS per protocol   ELECTROLYTES -follow labs as needed -replace as needed -pharmacy consultation   DVT/GI PRX ordered -SCDs  TRANSFUSIONS AS NEEDED MONITOR FSBS ASSESS the need for LABS as needed   Critical care provider statement:    Critical care time (minutes):  38   Critical care time was exclusive of:  Separately billable procedures and treating other patients   Critical care was necessary to treat or prevent imminent or life-threatening deterioration of the following conditions:   Acute encephalopathy, acute CVA status post TPA, ESRD, left lower  extremity status post BKA with stump infection, multiple comorbid conditions   Critical care was time spent personally by me on the following activities:  Development of treatment plan with patient or surrogate, discussions with consultants, evaluation of patient's response to treatment, examination of patient, obtaining history from patient or surrogate, ordering and performing treatments and interventions, ordering and review of laboratory studies and re-evaluation of patient's condition.  I assumed direction of critical care for this patient from another provider in my specialty: no    This document was prepared using Dragon voice recognition software and may include unintentional dictation errors.    Ottie Glazier, M.D.  Division of [place]

## 2018-07-09 NOTE — TOC Initial Note (Signed)
Transition of Care [facility]) - Initial/Assessment Note    Patient Details  Name: Stephanie Ellis MRN: [id number] Date of Birth: May 14, 1950  [facility] CM/SW Contact:    Shelbie Hutching, RN Phone Number: 07/09/2018, 12:15 PM  Clinical Narrative:                 Patient is from [facility], admitted yesterday after becoming unresponsive at dialysis treatment.  Patient goes to dialysis M,W,F.  Elvera Bicker with patient pathways notified of admission.  Patient reports that she has been at [facility] since October.  At discharge patient will go back to [facility].    Expected Discharge Plan: [place]     Patient Goals and CMS Choice        Expected Discharge Plan and Services Expected Discharge Plan: [place]   Discharge Planning Services: CM Consult   Living arrangements for the past 2 months: [place]                          Prior Living Arrangements/Services Living arrangements for the past 2 months: [place] Lives with:: Facility Resident   Do you feel safe going back to the place where you live?: Yes      Need for Family Participation in Patient Care: No (Comment) Care giver support system in place?: Yes (comment)(Has daughters and staying at [facility])      Activities of Daily Living      Permission Sought/Granted Permission sought to share information with : Facility Art therapist granted to share information with : Yes, Verbal Permission Granted     Permission granted to share info w AGENCY: [facility]        Emotional Assessment Appearance:: Appears stated age Attitude/Demeanor/Rapport: Engaged Affect (typically observed): Accepting Orientation: : Oriented to Self, Oriented to Place, Oriented to  Time, Oriented to Situation Alcohol / Substance Use: Not Applicable Psych Involvement: No (comment)  Admission diagnosis:  CVA (cerebral  vascular accident) ([place]) [I63.9] Wound infection [T14.8XXA, L08.9] Acute CVA (cerebrovascular accident) ([place]) [I63.9] Hypertension, unspecified type [I10] Patient Active Problem List   Diagnosis Date Noted  . CVA (cerebral vascular accident) ([place]) 07/08/2018  . Infection of amputation stump ([place]) 05/02/2018  . BKA stump complication ([place]) 94/50/3888  . Unilateral complete BKA, right, initial encounter ([place]) 03/30/2018  . Transient loss of consciousness 03/19/2018  . Transient alteration of awareness   . VT (ventricular tachycardia) ([place])   . Disorientation   . Hypertensive urgency 03/08/2018  . Ischemia of extremity 02/18/2018  . Diabetic osteomyelitis ([place])   . Peripheral vascular disease ([place])   . Advanced care planning/counseling discussion   . Palliative care by specialist   . Goals of care, counseling/discussion   . Diabetes mellitus, type 2 ([place]) 01/30/2018  . Sepsis ([place]) 01/29/2018  . Peripheral neuropathy 06/17/2017  . Encephalopathy 02/06/2017  . Hypertensive emergency 02/06/2017  . Endophthalmitis, left eye 12/05/2016  . Vomiting 10/24/2016  . Hematuria 10/17/2016  . Acute lower UTI 10/17/2016  . Anesthesia complication 28/00/3491  . Chest pain with moderate risk for cardiac etiology 09/04/2016  . Steal syndrome dialysis vascular access, initial encounter ([place]) 06/18/2016  . 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 Coronary artery disease involving coronary bypass graft of native heart with angina pectoris ([place]) 02/10/2016  . Hypertension 02/02/2016  . Hyperlipidemia 02/02/2016  . Coronary artery disease involving left main coronary artery 10/04/2015  .  Abdominal pain of unknown etiology 10/04/2015  . ESRD (end stage renal disease) on dialysis ([place])   . Type II diabetes mellitus with complication ([place])   . NSTEMI (non-ST elevated myocardial infarction) ([place])   . Right sided abdominal pain   . Elevated troponin 10/03/2015  . S/P CABG x 3  03/24/2015  . Chronic diastolic congestive heart failure ([place]) 04/19/2014  . Anemia 09/20/2013  . Routine health maintenance 01/29/2013   PCP:  Kirk Ruths, MD Pharmacy:   [facility] [address] ([address]), [place]) [place] [address] Phone: [phone] Fax: [place], [place] - [place]. [place] [address] Phone: [phone] Fax: [phone]     Social Determinants of Health (SDOH) Interventions    Readmission Risk Interventions No flowsheet data found.

## 2018-07-09 NOTE — Progress Notes (Signed)
*  PRELIMINARY RESULTS* Echocardiogram 2D Echocardiogram has been performed.  [place] 07/09/2018, 11:47 AM

## 2018-07-09 NOTE — Progress Notes (Signed)
PT Cancellation Note  Patient Details Name: Stephanie Ellis MRN: [id number] DOB: 09-May-1950   Cancelled Treatment:    Reason Eval/Treat Not Completed: Medical issues which prohibited therapy.  Pt with partial tPA dose administered 07/08/18 ending at 16:33.  Will hold PT eval x24 hours and initiate PT evaluation as medically appropriate.     Linus Salmons PT, DPT 07/09/18, 9:08 AM

## 2018-07-09 NOTE — Progress Notes (Signed)
Pt refused eeg at 110pm

## 2018-07-09 NOTE — Progress Notes (Signed)
Report called to [facility] RN on 1C. Patient to move to room 115. Left voicemail with daughter [name] about transfer.  Spoke with pharmacist, Legrand Como, about patient's new keppra dose. Per pharmacist, patient only gets 250 mg PO dose if patient gets dialysis. Since patient has not gotten dialysis today, will not give dose at this time.

## 2018-07-09 NOTE — Consult Note (Addendum)
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 Drainage (amount, consistency, odor) scant, no odor Periwound: edges of wound with hyperbole Dressing procedure/placement/frequency: Pt is addiment that she will not allow wound VAC to be replaced. Understands it would lead to healing in less time. Wound clean, will write orders for treatment that Vascular Surgery recommended, Xeroform. Pt's lab for nutritional status is normal with an albumin level of 3.5 and protein of 7.2. Pt's home med list shows Renal vitamins as well as Renal feeding supplements. These have not been ordered for in pt use. For optimum wound healing please order if you agree.  We will not follow, but will remain available to this patient, to nursing, and the medical and/or surgical teams.  Please re-consult if we need to assist further.  Fara Olden, RN-C, WTA-C, [place] [facility] Associate

## 2018-07-09 NOTE — Consult Note (Signed)
[facility] Vascular Consult Note  MRN : [id number]  Stephanie Ellis is a 68 y.o. (07/09/1950) female who presents with chief complaint of  Chief Complaint  Patient presents with  . Altered Mental Status   History of Present Illness:  The patient is a 68 year old female well-known to our practice with a past medical history of diabetes, sinusitis, coronary artery disease status post CABG x3, peripheral vascular disease status post bilateral below the knee amputation, hypercholesteremia, GERD, hypertension, end-stage renal disease on chronic hemodialysis, carotid artery disease, anemia, congestive heart failure who presented to the [facility] emergency department via EMS with a chief complaint of "altered mental status".  Patient presented to the emergency department minimally responsive upon arrival.  Patient was undergoing dialysis and staff note she " lost consciousness".  During work-up in the ED, the patient was awake but nonverbal.  At that time she was tracking to voice and nodding to questions.  The patient was started on TPA in the emergency department.  This morning the patient is alert and answering questions appropriately but confused at times.  The patient had an appointment in our clinic yesterday for a wound check.  The patient notes that she "removed her VAC because she is all healed".  Nursing states that the patient over the last few days has been removing her VAC. Refusing replacement. Patient denies any worsening stump pain, swelling, discharge or erythema.  Patient denies any fever, nausea vomiting.  The patient is s/p irrigation and debridement of left BKA stump of skin, soft tissue, and muscle for approximately 100cm2 with Dr. Lucky Cowboy on 05/08/18.  The patient was last seen in our office on June 08, 2018 with a physical exam notable for:     1) 100% granulation tissue noted to the wound bed - healing well Recommendation at that  time was:  The patient should continue VAC therapy with changes every other day until the wound is completely healed.  With a 1 month follow-up wound check which would have been yesterday.  Vascular surgery was consulted by NP Blakeney for right BKA recommendations. Current Facility-Administered Medications  Medication Dose Route Frequency Provider Last Rate Last Dose  . 0.9 %  sodium chloride infusion   Intravenous PRN Loletha Grayer, MD   Stopped at 07/08/18 1747  . acetaminophen (TYLENOL) tablet 650 mg  650 mg Oral Q6H PRN Wieting, Richard, MD      . atorvastatin (LIPITOR) tablet 80 mg  80 mg Oral QHS Wieting, Richard, MD      . brimonidine (ALPHAGAN) 0.2 % ophthalmic solution 1 drop  1 drop Both Eyes TID Loletha Grayer, MD   1 drop at 07/08/18 2311  . calcitRIOL (ROCALTROL) capsule 0.5 mcg  0.5 mcg Oral Daily Wieting, Richard, MD      . Chlorhexidine Gluconate Cloth 2 % PADS 6 each  6 each Topical Q0600 Ottie Glazier, MD      . famotidine (PEPCID) IVPB 20 mg premix  20 mg Intravenous QHS Charlett Nose, RPH   Stopped at 07/08/18 2336  . hydrALAZINE (APRESOLINE) injection 10 mg  10 mg Intravenous Q4H PRN Darel Hong D, NP      . labetalol (NORMODYNE,TRANDATE) injection 10 mg  10 mg Intravenous Q2H PRN Loletha Grayer, MD   10 mg at 07/09/18 0408  . levETIRAcetam (KEPPRA) IVPB 500 mg/100 mL premix  500 mg Intravenous Q12H Charlett Nose, RPH      . mirtazapine (REMERON) tablet 7.5 mg  7.5 mg Oral QHS Wieting, Richard, MD      . mupirocin ointment (BACTROBAN) 2 % 1 application  1 application Nasal BID Ottie Glazier, MD      . polyvinyl alcohol (LIQUIFILM TEARS) 1.4 % ophthalmic solution 1 drop  1 drop Both Eyes BID Loletha Grayer, MD   1 drop at 07/08/18 2312  . sevelamer carbonate (RENVELA) tablet 800 mg  800 mg Oral TID WC Loletha Grayer, MD   800 mg at 07/09/18 0824  . timolol (TIMOPTIC) 0.5 % ophthalmic solution 1 drop  1 drop Both Eyes TID Loletha Grayer, MD   1  drop at 07/08/18 2312   Past Medical History:  Diagnosis Date  . (HFpEF) heart failure with preserved ejection fraction ([place])    a. 01/2018 Echo: EF 50-55%, no rwma, Gr1 DD, triv AI, mild MR. Midly dil LA/RV, mod reduced RV fxn. Irreg thickening of TV w/ mobile echodensity that appears to arise from valve.  . Anemia   . Anorexia   . Bacteremia due to Pseudomonas   . Carotid arterial disease ([place])    a. 01/2017 Carotid U/S: RICA <02, LICA <54.  Marland Kitchen Complication of anesthesia    a. Pt reports h/o complication on 9 different occasions - ? hypotension/arrest.  . 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 01/2015 Cath [facility]): 70% LM, p-mLAD 50-60% (Resting FFR 0.75), mRCA 80-90%, ~40 Ost OM & D1-->CABG; b. 01/2016 Staged PCI of LCX x 2 and RCA.  Marland Kitchen ESRD (end stage renal disease) on dialysis ([place])    a. ESRD secondary to acute kidney failure s/p CABG-->PD  . Essential hypertension   . GERD (gastroesophageal reflux disease)   . Heart murmur   . Hypercholesterolemia   . Myocardial infarction ([place]) 2016  . PVD (peripheral vascular disease) ([place])    a. 01/30/2018 PV Angio: Sev Left Popliteal dzs s/p PTA w/ 58m lutonix DEB w/ mech aspiration of the L popliteal, L AT, and Left tibioperoneal trunck and peroneal artery.  . 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  [facility]: Dr. BMarland KitchenHaithcock: CABG x 3, LIMA to LAD, SVG to RCA, SVG to OM3, EVH  . Sinusitis 2019  . Type II diabetes mellitus with complication (HCC)    CAD   Past Surgical History:  Procedure Laterality Date  . ABDOMINAL HYSTERECTOMY    . AMPUTATION Right 02/25/2018   Procedure: AMPUTATION BELOW KNEE;  Surgeon: SKatha Cabal MD;  Location: [facility];  Service: Vascular;  Laterality: Right;  . AMPUTATION Left 03/11/2018   Procedure: AMPUTATION BELOW KNEE;  Surgeon: DAlgernon Huxley MD;  Location: [facility];  Service: Vascular;  Laterality: Left;  . AMPUTATION Bilateral 04/13/2018   Procedure: REVISION OF BILATERAL  AMPUTATIONS BELOW KNEE WITH WOUND VAC APPLICATIONS;  Surgeon: EEvaristo Bury MD;  Location: [facility];  Service: Vascular;  Laterality: Bilateral;  . AMPUTATION TOE Left 02/06/2018   Procedure: AMPUTATION GREAT TOE;  Surgeon: FSamara Deist DPM;  Location: [facility];  Service: Podiatry;  Laterality: Left;  . APPLICATION OF WOUND VAC Left 04/13/2018   Procedure: APPLICATION OF WOUND VAC;  Surgeon: EEvaristo Bury MD;  Location: [facility];  Service: Vascular;  Laterality: Left;  . ARTERIOVENOUS GRAFT PLACEMENT  05/2016  . AV FISTULA PLACEMENT Left 05/30/2016   Procedure: ARTERIOVENOUS graft;  Surgeon: JAlgernon Huxley MD;  Location: [facility];  Service: Vascular;  Laterality: Left;  . CAPD INSERTION N/A 10/30/2017   Procedure: LAPAROSCOPIC INSERTION  CONTINUOUS AMBULATORY PERITONEAL DIALYSIS  (CAPD) CATHETER;  Surgeon: Algernon Huxley, MD;  Location: [facility];  Service: Vascular;  Laterality: N/A;  . CARDIAC CATHETERIZATION  01/2015   [facility]: Ost LM 70%, p-m LAD 50-60% (Rest FFR + @ 0.75), mRCA 80-90%, ostD1 40%, pOM1 40%  . CATARACT EXTRACTION W/PHACO Left 01/18/2016   Procedure: CATARACT EXTRACTION PHACO AND INTRAOCULAR LENS PLACEMENT (IOC);  Surgeon: Eulogio Bear, MD;  Location: [facility];  Service: Ophthalmology;  Laterality: Left;  [place] 1.05 AP% 15.5 CDE 10.16 Fluid Pack Lot # [id number]  . CATARACT EXTRACTION W/PHACO Right 08/01/2016   Procedure: CATARACT EXTRACTION PHACO AND INTRAOCULAR LENS PLACEMENT (IOC);  Surgeon: Eulogio Bear, MD;  Location: [facility];  Service: Ophthalmology;  Laterality: Right;  [place] 01:00.6 AP% 11.4 CDE 6.93  LOT # [id number]  . COLONOSCOPY    . CORONARY ANGIOPLASTY     SENTS 02/12/16  . CORONARY ARTERY BYPASS GRAFT  03/28/15    [facility]: Dr. Waldemar Dickens: LIMA to LAD, SVG to RCA, SVG to OM3, EVH  . DIALYSIS/PERMA CATHETER INSERTION N/A 02/11/2018   Procedure: DIALYSIS/PERMA CATHETER INSERTION;  Surgeon: Algernon Huxley, MD;  Location: [place] [facility];  Service: Cardiovascular;   Laterality: N/A;  . DIALYSIS/PERMA CATHETER REMOVAL Right 02/04/2018   Procedure: DIALYSIS/PERMA CATHETER REMOVAL;  Surgeon: Katha Cabal, MD;  Location: [place] [facility];  Service: Cardiovascular;  Laterality: Right;  . ESOPHAGOGASTRODUODENOSCOPY (EGD) WITH PROPOFOL N/A 10/24/2016   Procedure: ESOPHAGOGASTRODUODENOSCOPY (EGD) WITH PROPOFOL;  Surgeon: [name], MD;  Location: [facility];  Service: Endoscopy;  Laterality: N/A;  . EYE SURGERY Bilateral    cataract surgery  . EYE SURGERY     drains for glaucoma  . INSERTION EXPRESS TUBE SHUNT Right 08/01/2016   Procedure: INSERTION EXPRESS TUBE SHUNT;  Surgeon: Eulogio Bear, MD;  Location: [facility];  Service: Ophthalmology;  Laterality: Right;  . INSERTION OF AHMED VALVE Left 08/15/2016   Procedure: INSERTION OF AHMED VALVE;  Surgeon: Eulogio Bear, MD;  Location: [facility];  Service: Ophthalmology;  Laterality: Left;  . INSERTION OF DIALYSIS CATHETER    . LOWER EXTREMITY ANGIOGRAPHY Right 02/19/2018   Procedure: Lower Extremity Angiography;  Surgeon: Algernon Huxley, MD;  Location: [place] [facility];  Service: Cardiovascular;  Laterality: Right;  . LOWER EXTREMITY ANGIOGRAPHY Left 01/30/2018   Procedure: Lower Extremity Angiography;  Surgeon: Katha Cabal, MD;  Location: [place] [facility];  Service: Cardiovascular;  Laterality: Left;  . REMOVAL OF A DIALYSIS CATHETER N/A 02/25/2018   Procedure: REMOVAL OF A DIALYSIS CATHETER;  Surgeon: Katha Cabal, MD;  Location: [facility];  Service: Vascular;  Laterality: N/A;  . TEMPORARY DIALYSIS CATHETER N/A 02/06/2018   Procedure: TEMPORARY DIALYSIS CATHETER;  Surgeon: Katha Cabal, MD;  Location: [place] [facility];  Service: Cardiovascular;  Laterality: N/A;  . TRANSTHORACIC ECHOCARDIOGRAM  January 2017    EF 60-65%. GR 2 DD. Mild degenerative mitral valve disease but no prolapse or regurgitation. Mild left atrial dilation. Mild to moderate LVH. Pericardial  effusion gone  . UPPER EXTREMITY ANGIOGRAPHY Left 06/27/2016   Procedure: Upper Extremity Angiography;  Surgeon: Algernon Huxley, MD;  Location: [place] [facility];  Service: Cardiovascular;  Laterality: Left;  . UPPER EXTREMITY ANGIOGRAPHY Left 08/22/2016   Procedure: Upper Extremity Angiography;  Surgeon: Algernon Huxley, MD;  Location: [place] [facility];  Service: Cardiovascular;  Laterality: Left;  Marland Kitchen VASCULAR SURGERY    . WOUND DEBRIDEMENT Left 05/08/2018  Procedure: DEBRIDEMENT OF LEFT LEG BKA;  Surgeon: Algernon Huxley, MD;  Location: [facility];  Service: General;  Laterality: Left;   Social History Social History   Tobacco Use  . Smoking status: Never Smoker  . Smokeless tobacco: Never Used  Substance Use Topics  . Alcohol use: No  . Drug use: No   Family History Family History  Problem Relation Age of Onset  . Diabetes Mellitus II Mother   . Heart failure Mother   . Pancreatic cancer Father   Denies family history of peripheral artery disease, venous disease and/or bleeding clotting disorders  Allergies  Allergen Reactions  . Chlorthalidone Anaphylaxis, Itching and Rash  . Fentanyl Rash  . Midazolam Rash  . Ace Inhibitors Other (See Comments)    Reaction:  Hyperkalemia, agitation   . Angiotensin Receptor Blockers Other (See Comments)    Reaction:  Hyperkalemia, agitation   . Norvasc [Amlodipine] Itching and Rash  . Phenergan [Promethazine Hcl] Anxiety    "antsy, can't sit still"   REVIEW OF SYSTEMS (Negative unless checked)  Constitutional: [] Weight loss  [] Fever  [] Chills Cardiac: [] Chest pain   [] Chest pressure   [] Palpitations   [] Shortness of breath when laying flat   [] Shortness of breath at rest   [] Shortness of breath with exertion. Vascular:  [] Pain in legs with walking   [] Pain in legs at rest   [] Pain in legs when laying flat   [] Claudication   [] Pain in feet when walking  [] Pain in feet at rest  [] Pain in feet when laying flat   [] History of DVT   [] Phlebitis    [] Swelling in legs   [] Varicose veins   [] Non-healing ulcers Pulmonary:   [] Uses home oxygen   [] Productive cough   [] Hemoptysis   [] Wheeze  [] COPD   [] Asthma Neurologic:  [] Dizziness  [] Blackouts   [] Seizures   [] History of stroke   [x] History of TIA  [x] Aphasia   [] Temporary blindness   [] Dysphagia   [] Weakness or numbness in arms   [] Weakness or numbness in legs Musculoskeletal:  [] Arthritis   [] Joint swelling   [] Joint pain   [] Low back pain Hematologic:  [] Easy bruising  [] Easy bleeding   [] Hypercoagulable state   [] Anemic  [] Hepatitis Gastrointestinal:  [] Blood in stool   [] Vomiting blood  [] Gastroesophageal reflux/heartburn   [] Difficulty swallowing. Genitourinary:  [x] Chronic kidney disease   [] Difficult urination  [] Frequent urination  [] Burning with urination   [] Blood in urine Skin:  [] Rashes   [x] Ulcers   [x] Wounds Psychological:  [] History of anxiety   []  History of major depression.  Physical Examination  Vitals:   07/09/18 0500 07/09/18 0600 07/09/18 0700 07/09/18 0800  BP: (!) 118/101 120/75 (!) 159/74 (!) 151/93  Pulse: 65   70  Resp: 14 (!) 26 16 15   Temp:    97.6 F (36.4 C)  TempSrc:    Oral  SpO2: 97%   96%  Weight:       Body mass index is 19.6 kg/m. Gen:  WD/WN, NAD, seems to be more alert and coherent this a.m. Head: [place]/AT, No temporalis wasting. Prominent temp pulse not noted. Ear/Nose/Throat: Hearing grossly intact, nares w/o erythema or drainage, oropharynx w/o Erythema/Exudate Eyes: Sclera non-icteric, conjunctiva clear Neck: Trachea midline.  No JVD.  Pulmonary:  Good air movement, respirations not labored, equal bilaterally.  Cardiac: RRR, normal S1, S2. Vascular:  Vessel Right Left  Radial Palpable Palpable  Ulnar Palpable Palpable  Brachial Palpable Palpable  Carotid Palpable, without bruit Palpable, without  bruit  Aorta Not palpable N/A  Femoral Palpable Palpable  Popliteal Palpable Palpable  PT BKA BKA  DP BKA BKA   Right Lower Extremity:  Stump healthy.  No skin breakdown noted. Left Lower Extremity: Approximately 90% granulation tissue is noted.  Small area of fibrinous exudate.  Surrounding skin is healthy.  There is no foul odor.  There is no discharge.  Gastrointestinal: soft, non-tender/non-distended. No guarding/reflex.  Musculoskeletal: M/S 5/5 throughout.  Marland Kitchen Neurologic: Sensation grossly intact in extremities.  Symmetrical.  Speech is fluent. Motor exam as listed above. Psychiatric: Judgment intact, Mood & affect appropriate for pt's clinical situation. Dermatologic: As above Lymph : No Cervical, Axillary, or Inguinal lymphadenopathy.  CBC Lab Results  Component Value Date   WBC 7.6 07/09/2018   HGB 13.1 07/09/2018   HCT 43.7 07/09/2018   MCV 95.6 07/09/2018   PLT 128 (L) 07/09/2018   BMET    Component Value Date/Time   NA 138 07/09/2018 0409   K 4.8 07/09/2018 0409   CL 99 07/09/2018 0409   CO2 23 07/09/2018 0409   GLUCOSE 83 07/09/2018 0409   BUN 61 (H) 07/09/2018 0409   CREATININE 7.17 (H) 07/09/2018 0409   CALCIUM 8.1 (L) 07/09/2018 0409   GFRNONAA 5 (L) 07/09/2018 0409   GFRAA 6 (L) 07/09/2018 0409   Estimated Creatinine Clearance: 6.2 mL/min (A) (by C-G formula based on SCr of 7.17 mg/dL (H)).  COAG Lab Results  Component Value Date   INR 1.2 07/08/2018   INR 1.11 05/08/2018   INR 1.25 03/11/2018   Radiology Mr Jodene Nam Head Wo Contrast  Result Date: 07/08/2018 CLINICAL DATA:  Altered mental status. Speech disturbance. Dialysis patient. EXAM: MRI HEAD WITHOUT CONTRAST MRA HEAD WITHOUT CONTRAST TECHNIQUE: Multiplanar, multiecho pulse sequences of the brain and surrounding structures were obtained without intravenous contrast. Angiographic images of the head were obtained using MRA technique without contrast. COMPARISON:  Head CT same day.  MRI 03/14/2018. FINDINGS: MRI HEAD FINDINGS Brain: The study suffers from some motion degradation. Diffusion imaging does not show any acute or subacute  infarction. The brainstem is normal. Few old small vessel cerebellar infarctions. Cerebral hemispheres show age related atrophy with mild to moderate chronic small-vessel ischemic changes of the white matter. Old small vessel thalamic infarction on the left. No cortical or large vessel territory infarction. No mass lesion, hemorrhage, hydrocephalus or extra-axial collection. Vascular: Major vessels at the base of the brain show flow. Skull and upper cervical spine: Negative Sinuses/Orbits: Sphenoid sinusitis. Other sinuses are clear. Orbits negative. Other: None MRA HEAD FINDINGS Severe motion degradation. Both internal carotid arteries show flow. There is flow in the anterior and middle cerebral vessels. Both vertebral arteries are patent to the basilar. The basilar artery and major posterior circulation branch vessels show flow. Detail beyond that is not reliable. IMPRESSION: No acute brain finding. Chronic small-vessel ischemic changes of the cerebellum, left thalamus and cerebral hemispheric white matter. MR angiogram is markedly degraded by motion. There does not appear to be any large vessel occlusion. Beyond that, detail is not reliable because of motion. Electronically Signed   By: Nelson Chimes M.D.   On: 07/08/2018 17:08   Mr Brain Wo Contrast  Result Date: 07/08/2018 CLINICAL DATA:  Altered mental status. Speech disturbance. Dialysis patient. EXAM: MRI HEAD WITHOUT CONTRAST MRA HEAD WITHOUT CONTRAST TECHNIQUE: Multiplanar, multiecho pulse sequences of the brain and surrounding structures were obtained without intravenous contrast. Angiographic images of the head were obtained using MRA technique without contrast.  COMPARISON:  Head CT same day.  MRI 03/14/2018. FINDINGS: MRI HEAD FINDINGS Brain: The study suffers from some motion degradation. Diffusion imaging does not show any acute or subacute infarction. The brainstem is normal. Few old small vessel cerebellar infarctions. Cerebral hemispheres show  age related atrophy with mild to moderate chronic small-vessel ischemic changes of the white matter. Old small vessel thalamic infarction on the left. No cortical or large vessel territory infarction. No mass lesion, hemorrhage, hydrocephalus or extra-axial collection. Vascular: Major vessels at the base of the brain show flow. Skull and upper cervical spine: Negative Sinuses/Orbits: Sphenoid sinusitis. Other sinuses are clear. Orbits negative. Other: None MRA HEAD FINDINGS Severe motion degradation. Both internal carotid arteries show flow. There is flow in the anterior and middle cerebral vessels. Both vertebral arteries are patent to the basilar. The basilar artery and major posterior circulation branch vessels show flow. Detail beyond that is not reliable. IMPRESSION: No acute brain finding. Chronic small-vessel ischemic changes of the cerebellum, left thalamus and cerebral hemispheric white matter. MR angiogram is markedly degraded by motion. There does not appear to be any large vessel occlusion. Beyond that, detail is not reliable because of motion. Electronically Signed   By: Nelson Chimes M.D.   On: 07/08/2018 17:08   Dg Chest Portable 1 View  Result Date: 07/08/2018 CLINICAL DATA:  Altered mental status. EXAM: PORTABLE CHEST 1 VIEW COMPARISON:  04/23/2018. FINDINGS: Stable enlarged cardiac silhouette. Interval mild patchy opacities in both lungs, right greater than left. Stable post CABG changes. Stable left jugular double-lumen catheter with its tip in the mid right atrium. Unremarkable bones. IMPRESSION: 1. Interval mild bilateral alveolar edema or pneumonia, right greater than left. 2. Stable cardiomegaly. Electronically Signed   By: Claudie Revering M.D.   On: 07/08/2018 17:41   Ct Head Code Stroke Wo Contrast  Addendum Date: 07/08/2018   ADDENDUM REPORT: 07/08/2018 14:15 ADDENDUM: Study discussed by telephone with Dr. Eula Listen on 07/08/2018 at 1357 hours. Electronically Signed   By: Genevie Ann M.D.   On: 07/08/2018 14:15   Result Date: 07/08/2018 CLINICAL DATA:  Code stroke. 68 year old female with left facial droop and syncope. Last known well 1250 hours. EXAM: CT HEAD WITHOUT CONTRAST TECHNIQUE: Contiguous axial images were obtained from the base of the skull through the vertex without intravenous contrast. COMPARISON:  Head CT 03/17/2018. Brain MRI 03/14/2018 and earlier. FINDINGS: Brain: No midline shift, mass effect, or evidence of intracranial mass lesion. No ventriculomegaly. No acute intracranial hemorrhage identified. New heterogeneity of the left thalamus on series 2, image 14, but this would result in right side symptoms. Gray-white matter differentiation otherwise appears stable. No cortically based acute infarct identified. Vascular: Extensive Calcified atherosclerosis at the skull base. No suspicious intracranial vascular hyperdensity. Skull: Negative. Sinuses/Orbits: Chronic left sphenoid sinusitis, otherwise stable and well pneumatized. Other: No acute orbit or scalp soft tissue findings. There is some calcified scalp vessel atherosclerosis. ASPECTS [facility] Stroke Program Early CT Score) - Ganglionic level infarction (caudate, lentiform nuclei, internal capsule, insula, M1-M3 cortex): 7 - Supraganglionic infarction (M4-M6 cortex): 3 Total score (0-10 with 10 being normal): 10 IMPRESSION: 1. 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 2. No acute cortically based infarct or acute intracranial hemorrhage identified. ASPECTS is 10. 3. Calcified atherosclerosis, including in scalp vessels which can be seen in the setting of in stage renal disease. Electronically Signed: By: Genevie Ann M.D. On: 07/08/2018 13:52  Assessment/Plan The patient is a 68 year old female well-known to our practice with a past medical history of diabetes, sinusitis, coronary artery disease status post CABG x3, peripheral vascular disease status  post bilateral below the knee amputation, hypercholesteremia, GERD, hypertension, end-stage renal disease on chronic hemodialysis, carotid artery disease, anemia, congestive heart failure who presented to the [facility] emergency department via EMS with a chief complaint of "altered mental status".  Admitted with acute CVA. 1.  Below the knee amputation site: Strongly recommend continuing VAC therapy until the skin / wound is completely healed.  Due to the patient's past medical history of peripheral artery disease and need for surgical debridement she is at high risk for her wound to decompensate.  At the present time, her stump continues to heal.  I had a long discussion with the patient in regard to replacing the [facility] however at this time she is refusing.  No surgical intervention is indicated at this time.  I have no choice but to continue local wound care.  Would cover the wound bed with Xeroform and wrapped with a Kerlix changed on a daily basis.  I will also consult the wound nurse to see if they have any further recommendations.  Would maximize the patient's nutrition for wound healing.  We will continue to follow along. 2. Carotid Artery Disease: Patient with known carotid artery disease.  With recent diagnosis of CVA carotid duplex is being repeated today.  We will follow-up. 3.  Diabetes: On appropriate medications.  Encouraged good control as this directly affects wound healing 4.  Hyperlipidemia: Patient is on aspirin, Eliquis and Lipitor for medical management as an outpatient.  Encouraged good control as its slows the progression of atherosclerotic disease  Discussed with Dr. Mayme Genta, PA-C  07/09/2018 9:23 AM  This note was created with Dragon medical transcription system.  Any error is purely unintentional

## 2018-07-09 NOTE — Progress Notes (Signed)
[facility]  ROUNDING NOTE   Subjective:  Patient much more awake and alert today. Oriented to self but still confused a bit. Due for dialysis treatment today.  Objective:  Vital signs in last 24 hours:  Temp:  [97.6 F (36.4 C)-98.2 F (36.8 C)] 97.6 F (36.4 C) (03/19 0800) Pulse Rate:  [32-98] 74 (03/19 0900) Resp:  [10-26] 15 (03/19 0800) BP: (95-212)/(47-128) 147/85 (03/19 0900) SpO2:  [70 %-100 %] 100 % (03/19 0900) Weight:  [51.8 kg] 51.8 kg (03/18 1351)  Weight change:  Filed Weights   07/08/18 1351  Weight: 51.8 kg    Intake/Output: I/O last 3 completed shifts: In: 165.8 [I.V.:23.1; IV Piggyback:142.7] Out: -    Intake/Output this shift:  No intake/output data recorded.  Physical Exam: General: No acute distress  Head: Normocephalic, atraumatic. Moist oral mucosal membranes  Eyes: Anicteric  Neck: Supple, trachea midline  Lungs:  Clear to auscultation, normal effort  Heart: S1S2 no rubs  Abdomen:  Soft, nontender, bowel sounds present  Extremities: B/L BKA  Neurologic: Awake, alert, following commands, oriented to self  Skin: No lesions  Access: IJ permcath    Basic Metabolic Panel: Recent Labs  Lab 07/08/18 1357 07/09/18 0409  NA 137 138  K 4.4 4.8  CL 97* 99  CO2 23 23  GLUCOSE 126* 83  BUN 43* 61*  CREATININE 6.00* 7.17*  CALCIUM 8.0* 8.1*  MG  --  2.2    Liver Function Tests: Recent Labs  Lab 07/08/18 1357  AST 22  ALT 17  ALKPHOS 85  BILITOT 0.4  PROT 7.2  ALBUMIN 3.5   No results for input(s): LIPASE, AMYLASE in the last 168 hours. Recent Labs  Lab 07/09/18 0409  AMMONIA <9*    CBC: Recent Labs  Lab 07/08/18 1357 07/09/18 0409  WBC 8.4 7.6  NEUTROABS 6.5  --   HGB 14.1 13.1  HCT 47.0* 43.7  MCV 95.1 95.6  PLT 127* 128*    Cardiac Enzymes: Recent Labs  Lab 07/08/18 1357 07/08/18 2012 07/09/18 0409  TROPONINI 0.09* 0.11* 0.15*    BNP: Invalid input(s): POCBNP  CBG: Recent Labs  Lab  07/08/18 1331 07/08/18 1544  GLUCAP 97 108*    Microbiology: Results for orders placed or performed during the hospital encounter of 07/08/18  MRSA PCR Screening     Status: Abnormal   Collection Time: 07/08/18  3:42 PM  Result Value Ref Range Status   MRSA by PCR POSITIVE (A) NEGATIVE Final    Comment:        The GeneXpert MRSA Assay (FDA approved for NASAL specimens only), is one component of a comprehensive MRSA colonization surveillance program. It is not intended to diagnose MRSA infection nor to guide or monitor treatment for MRSA infections. RESULT CALLED TO, READ BACK BY AND VERIFIED WITH: DILLON WHITE 07/08/18 @ [place] Performed at [facility], [address] [place], [place] [address]     Coagulation Studies: Recent Labs    07/08/18 1357  LABPROT 15.3*  INR 1.2    Urinalysis: No results for input(s): COLORURINE, LABSPEC, PHURINE, GLUCOSEU, HGBUR, BILIRUBINUR, KETONESUR, PROTEINUR, UROBILINOGEN, NITRITE, LEUKOCYTESUR in the last 72 hours.  Invalid input(s): APPERANCEUR    Imaging: Mr Virgel Paling XB Contrast  Result Date: 07/08/2018 CLINICAL DATA:  Altered mental status. Speech disturbance. Dialysis patient. EXAM: MRI HEAD WITHOUT CONTRAST MRA HEAD WITHOUT CONTRAST TECHNIQUE: Multiplanar, multiecho pulse sequences of the brain and surrounding structures were obtained without intravenous contrast.  Angiographic images of the head were obtained using MRA technique without contrast. COMPARISON:  Head CT same day.  MRI 03/14/2018. FINDINGS: MRI HEAD FINDINGS Brain: The study suffers from some motion degradation. Diffusion imaging does not show any acute or subacute infarction. The brainstem is normal. Few old small vessel cerebellar infarctions. Cerebral hemispheres show age related atrophy with mild to moderate chronic small-vessel ischemic changes of the white matter. Old small vessel thalamic infarction on the left. No cortical or large vessel territory  infarction. No mass lesion, hemorrhage, hydrocephalus or extra-axial collection. Vascular: Major vessels at the base of the brain show flow. Skull and upper cervical spine: Negative Sinuses/Orbits: Sphenoid sinusitis. Other sinuses are clear. Orbits negative. Other: None MRA HEAD FINDINGS Severe motion degradation. Both internal carotid arteries show flow. There is flow in the anterior and middle cerebral vessels. Both vertebral arteries are patent to the basilar. The basilar artery and major posterior circulation branch vessels show flow. Detail beyond that is not reliable. IMPRESSION: No acute brain finding. Chronic small-vessel ischemic changes of the cerebellum, left thalamus and cerebral hemispheric white matter. MR angiogram is markedly degraded by motion. There does not appear to be any large vessel occlusion. Beyond that, detail is not reliable because of motion. Electronically Signed   By: Nelson Chimes M.D.   On: 07/08/2018 17:08   Mr Brain Wo Contrast  Result Date: 07/08/2018 CLINICAL DATA:  Altered mental status. Speech disturbance. Dialysis patient. EXAM: MRI HEAD WITHOUT CONTRAST MRA HEAD WITHOUT CONTRAST TECHNIQUE: Multiplanar, multiecho pulse sequences of the brain and surrounding structures were obtained without intravenous contrast. Angiographic images of the head were obtained using MRA technique without contrast. COMPARISON:  Head CT same day.  MRI 03/14/2018. FINDINGS: MRI HEAD FINDINGS Brain: The study suffers from some motion degradation. Diffusion imaging does not show any acute or subacute infarction. The brainstem is normal. Few old small vessel cerebellar infarctions. Cerebral hemispheres show age related atrophy with mild to moderate chronic small-vessel ischemic changes of the white matter. Old small vessel thalamic infarction on the left. No cortical or large vessel territory infarction. No mass lesion, hemorrhage, hydrocephalus or extra-axial collection. Vascular: Major vessels at  the base of the brain show flow. Skull and upper cervical spine: Negative Sinuses/Orbits: Sphenoid sinusitis. Other sinuses are clear. Orbits negative. Other: None MRA HEAD FINDINGS Severe motion degradation. Both internal carotid arteries show flow. There is flow in the anterior and middle cerebral vessels. Both vertebral arteries are patent to the basilar. The basilar artery and major posterior circulation branch vessels show flow. Detail beyond that is not reliable. IMPRESSION: No acute brain finding. Chronic small-vessel ischemic changes of the cerebellum, left thalamus and cerebral hemispheric white matter. MR angiogram is markedly degraded by motion. There does not appear to be any large vessel occlusion. Beyond that, detail is not reliable because of motion. Electronically Signed   By: Nelson Chimes M.D.   On: 07/08/2018 17:08   Dg Chest Portable 1 View  Result Date: 07/08/2018 CLINICAL DATA:  Altered mental status. EXAM: PORTABLE CHEST 1 VIEW COMPARISON:  04/23/2018. FINDINGS: Stable enlarged cardiac silhouette. Interval mild patchy opacities in both lungs, right greater than left. Stable post CABG changes. Stable left jugular double-lumen catheter with its tip in the mid right atrium. Unremarkable bones. IMPRESSION: 1. Interval mild bilateral alveolar edema or pneumonia, right greater than left. 2. Stable cardiomegaly. Electronically Signed   By: Claudie Revering M.D.   On: 07/08/2018 17:41   Ct Head Code Stroke Wo  Contrast  Addendum Date: 07/08/2018   ADDENDUM REPORT: 07/08/2018 14:15 ADDENDUM: Study discussed by telephone with Dr. Eula Listen on 07/08/2018 at 1357 hours. Electronically Signed   By: Genevie Ann M.D.   On: 07/08/2018 14:15   Result Date: 07/08/2018 CLINICAL DATA:  Code stroke. 68 year old female with left facial droop and syncope. Last known well 1250 hours. EXAM: CT HEAD WITHOUT CONTRAST TECHNIQUE: Contiguous axial images were obtained from the base of the skull through the vertex  without intravenous contrast. COMPARISON:  Head CT 03/17/2018. Brain MRI 03/14/2018 and earlier. FINDINGS: Brain: No midline shift, mass effect, or evidence of intracranial mass lesion. No ventriculomegaly. No acute intracranial hemorrhage identified. New heterogeneity of the left thalamus on series 2, image 14, but this would result in right side symptoms. Gray-white matter differentiation otherwise appears stable. No cortically based acute infarct identified. Vascular: Extensive Calcified atherosclerosis at the skull base. No suspicious intracranial vascular hyperdensity. Skull: Negative. Sinuses/Orbits: Chronic left sphenoid sinusitis, otherwise stable and well pneumatized. Other: No acute orbit or scalp soft tissue findings. There is some calcified scalp vessel atherosclerosis. ASPECTS [facility] Stroke Program Early CT Score) - Ganglionic level infarction (caudate, lentiform nuclei, internal capsule, insula, M1-M3 cortex): 7 - Supraganglionic infarction (M4-M6 cortex): 3 Total score (0-10 with 10 being normal): 10 IMPRESSION: 1. 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 2. No acute cortically based infarct or acute intracranial hemorrhage identified. ASPECTS is 10. 3. Calcified atherosclerosis, including in scalp vessels which can be seen in the setting of in stage renal disease. Electronically Signed: By: Genevie Ann M.D. On: 07/08/2018 13:52     Medications:   . sodium chloride Stopped (07/08/18 1747)  . famotidine (PEPCID) IV Stopped (07/08/18 2336)  . levETIRAcetam     . atorvastatin  80 mg Oral QHS  . brimonidine  1 drop Both Eyes TID  . calcitRIOL  0.5 mcg Oral Daily  . Chlorhexidine Gluconate Cloth  6 each Topical Q0600  . mirtazapine  7.5 mg Oral QHS  . mupirocin ointment  1 application Nasal BID  . polyvinyl alcohol  1 drop Both Eyes BID  . sevelamer carbonate  800 mg Oral TID WC  . timolol  1 drop Both Eyes TID   sodium  chloride, acetaminophen, hydrALAZINE, labetalol  Assessment/ Plan:  68 y.o. female with end stage renal disease on hemodialysis, hypertension, congestive heart failure, coronary artery disease status post CABG, diabetes mellitus type II insulin dependent, GERD, peripheral vascular disease status post right BKA, history of endocarditis and osteomyelitis  [facility] [place] hemodialysis left IJ permcath  1.  ESRD on HD TTHS: Upon further clarification it appears that the patient had dialysis treatment yesterday as she missed dialysis on Tuesday.  Therefore we will plan for hemodialysis today.  Ultrafiltration target 1.5 kg.  2.  Altered mental status: no definitive CVA on MRI.  Could be hypertensive encephalopathy.  Much more awake and alert today.  3.  Hypertension: Blood pressure currently 147/85.  Patient receiving PRN hydralazine and labetalol.  Patient receiving PRN labetalol and hydralazine.  4.  Secondary hyperparathyroidism: Recheck serum phosphorus today.   LOS: 1 Leinani Lisbon 3/19/20209:52 AM

## 2018-07-09 NOTE — NC FL2 (Signed)
[place] LEVEL OF CARE SCREENING TOOL     IDENTIFICATION  Patient Name: Stephanie Ellis Birthdate: 1951/04/02 Sex: female Admission Date (Current Location): 07/08/2018  [place] and [place] Number:  Engineering geologist and Address:  [facility], [address], [place], [place] [address]      Provider Number: [id number]  Attending Physician Name and Address:  Max Sane, MD  Relative Name and Phone Number:       Current Level of Care: Hospital Recommended Level of Care: [place] Prior Approval Number:    Date Approved/Denied:   PASRR Number: [id number]  Discharge Plan: SNF    Current Diagnoses: Patient Active Problem List   Diagnosis Date Noted  . CVA (cerebral vascular accident) ([place]) 07/08/2018  . Infection of amputation stump ([place]) 05/02/2018  . BKA stump complication ([place]) 57/26/2035  . Unilateral complete BKA, right, initial encounter ([place]) 03/30/2018  . Transient loss of consciousness 03/19/2018  . Transient alteration of awareness   . VT (ventricular tachycardia) ([place])   . Disorientation   . Hypertensive urgency 03/08/2018  . Ischemia of extremity 02/18/2018  . Diabetic osteomyelitis ([place])   . Peripheral vascular disease ([place])   . Advanced care planning/counseling discussion   . Palliative care by specialist   . Goals of care, counseling/discussion   . Diabetes mellitus, type 2 ([place]) 01/30/2018  . Sepsis ([place]) 01/29/2018  . Peripheral neuropathy 06/17/2017  . Encephalopathy 02/06/2017  . Hypertensive emergency 02/06/2017  . Endophthalmitis, left eye 12/05/2016  . Vomiting 10/24/2016  . Hematuria 10/17/2016  . Acute lower UTI 10/17/2016  . Anesthesia complication 59/74/1638  . Chest pain with moderate risk for cardiac etiology 09/04/2016  . Steal syndrome dialysis vascular access, initial encounter ([place]) 06/18/2016  . 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 Coronary artery  disease involving coronary bypass graft of native heart with angina pectoris ([place]) 02/10/2016  . Hypertension 02/02/2016  . Hyperlipidemia 02/02/2016  . Coronary artery disease involving left main coronary artery 10/04/2015  . Abdominal pain of unknown etiology 10/04/2015  . ESRD (end stage renal disease) on dialysis ([place])   . Type II diabetes mellitus with complication ([place])   . NSTEMI (non-ST elevated myocardial infarction) ([place])   . Right sided abdominal pain   . Elevated troponin 10/03/2015  . 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 Chronic diastolic congestive heart failure ([place]) 04/19/2014  . Anemia 09/20/2013  . Routine health maintenance 01/29/2013    Orientation RESPIRATION BLADDER Height & Weight     Self, Time, Situation, Place  Normal Continent Weight: 51.8 kg Height:     BEHAVIORAL SYMPTOMS/MOOD NEUROLOGICAL BOWEL NUTRITION STATUS      Continent Diet  AMBULATORY STATUS COMMUNICATION OF NEEDS Skin   Total Care Verbally Other (Comment)(Left BKA wound, daily wound care with xeroform, wrap with kerlix and adhere with paper tape)                       Personal Care Assistance Level of Assistance  Bathing, Dressing Bathing Assistance: Limited assistance   Dressing Assistance: Limited assistance     Functional Limitations Info             SPECIAL CARE FACTORS FREQUENCY                       Contractures Contractures Info: Not present    Additional Factors Info  Code Status, Allergies Code Status Info: Partial Allergies Info: [place],  versed, chlorthalidone, ace inhibitors, angiotensin receptor blockers, norvasc, phenergan            Current Medications (07/09/2018):  This is the current hospital active medication list Current Facility-Administered Medications  Medication Dose Route Frequency Provider Last Rate Last Dose  . 0.9 %  sodium chloride infusion   Intravenous PRN Loletha Grayer, MD   Stopped at 07/08/18 1747  . acetaminophen (TYLENOL) tablet  650 mg  650 mg Oral Q6H PRN Wieting, Richard, MD      . atorvastatin (LIPITOR) tablet 80 mg  80 mg Oral QHS Wieting, Richard, MD      . brimonidine (ALPHAGAN) 0.2 % ophthalmic solution 1 drop  1 drop Both Eyes TID Loletha Grayer, MD   1 drop at 07/09/18 1115  . calcitRIOL (ROCALTROL) capsule 0.5 mcg  0.5 mcg Oral Daily Wieting, Richard, MD      . Chlorhexidine Gluconate Cloth 2 % PADS 6 each  6 each Topical Q0600 Ottie Glazier, MD      . famotidine (PEPCID) IVPB 20 mg premix  20 mg Intravenous QHS Charlett Nose, RPH   Stopped at 07/08/18 2336  . hydrALAZINE (APRESOLINE) injection 10 mg  10 mg Intravenous Q4H PRN Darel Hong D, NP      . labetalol (NORMODYNE,TRANDATE) injection 10 mg  10 mg Intravenous Q2H PRN Loletha Grayer, MD   10 mg at 07/09/18 0408  . levETIRAcetam (KEPPRA) IVPB 500 mg/100 mL premix  500 mg Intravenous Q12H Charlett Nose, [facility]   Stopped at 07/09/18 1137  . mirtazapine (REMERON) tablet 7.5 mg  7.5 mg Oral QHS Wieting, Richard, MD      . mupirocin ointment (BACTROBAN) 2 % 1 application  1 application Nasal BID Ottie Glazier, MD      . polyvinyl alcohol (LIQUIFILM TEARS) 1.4 % ophthalmic solution 1 drop  1 drop Both Eyes BID Loletha Grayer, MD   1 drop at 07/09/18 1115  . sevelamer carbonate (RENVELA) tablet 800 mg  800 mg Oral TID WC Loletha Grayer, MD   800 mg at 07/09/18 0824  . timolol (TIMOPTIC) 0.5 % ophthalmic solution 1 drop  1 drop Both Eyes TID Loletha Grayer, MD   1 drop at 07/09/18 1115     Discharge Medications: Please see discharge summary for a list of discharge medications.  Relevant Imaging Results:  Relevant Lab Results:   Additional Information    Shelbie Hutching, RN

## 2018-07-09 NOTE — Progress Notes (Addendum)
Patient tolerated tPA without complication.  Neuro status improved and now back to baseline with no focal neurologic deficit.  Imaging at 24 hours shows no hemorrhage. MRI shows no acute brain finding.  Chronic small vessel ischemic changes of the cerebellum, left thalamus and cerebral hemisphere white matter noted.  She was on renal dose Eliquis (apixaban) 2.5 mg daily prior to admission. She will be restarted on Eliquis (apixaban) daily at prior renal dose for secondary stroke prevention.  Patient refused EEG.

## 2018-07-09 NOTE — Progress Notes (Signed)
OT Cancellation Note  Patient Details Name: Stephanie Ellis MRN: [id number] DOB: 09/07/1950   Cancelled Treatment:    Reason Eval/Treat Not Completed: Patient not medically ready. Consult received, chart reviewed. Patient noted with acute CVA work up with partial tPA administration (07/08/2018, 16:33).  Per guidelines, patient to be on strict bedrest x24 hours and until repeat head CT completed, patient cleared for activity.  Will continue to follow and initiate as medically appropriate.  Jeni Salles, MPH, MS, OTR/L ascom [phone] 07/09/18, 10:20 AM

## 2018-07-09 NOTE — Evaluation (Signed)
Occupational Therapy Evaluation Patient Details Name: Stephanie Ellis MRN: [id number] DOB: February 15, 1951 Today's Date: 07/09/2018    History of Present Illness 68 y.o. female with past medical history of seizure disorder, PVD, ESRD on hemodialysis, diabetes mellitus, chronic diastolic CHF, bilateral below-knee amputation, endocarditis of tricuspid valve, CAD, MI, remote history of atrial fibrillation with RVR presenting to the ED from hemodialysis center with altered mental status. Concerns for ischemic event.  Initial CT head reviewed and shows age indeterminate left thalamic lacunar infarct new since November otherwise no acute cortically based infarct or acute intracranial hemorrhage identified. Partial tPA dose given and stopped. RN cleared therapy to work with pt.   Clinical Impression   Pt seen for OT evaluation this date. RN cleared pt to work with therapy. Prior to hospital admission, pt was at rehab after recent hospital admissions, per chart review. Currently pt demonstrates impairments in strength, balance, and cognition (see below for detail) requiring Min assist for bed level LB ADL, set up for self feeding, and Min A for lateral scoot transfers EOB. Pt would benefit from skilled OT to address noted impairments and functional limitations (see below for any additional details) in order to maximize safety and independence while minimizing falls risk and caregiver burden.  Upon hospital discharge, recommend pt discharge back to STR to resume therapy.     Follow Up Recommendations  SNF(resume STR)    Equipment Recommendations       Recommendations for Other Services       Precautions / Restrictions Precautions Precautions: Fall Restrictions Weight Bearing Restrictions: Yes RLE Weight Bearing: Non weight bearing LLE Weight Bearing: Non weight bearing Other Position/Activity Restrictions: bilat BKA      Mobility Bed Mobility Overal bed mobility: Needs Assistance Bed Mobility:  Supine to Sit;Sit to Supine     Supine to sit: HOB elevated;Min guard Sit to supine: Min guard   General bed mobility comments: cues to continue and complete sit>supine in bed as pt was easily distractible  Transfers Overall transfer level: Needs assistance   Transfers: Lateral/Scoot Transfers          Lateral/Scoot Transfers: Min assist      Balance Overall balance assessment: Needs assistance Sitting-balance support: No upper extremity supported;Bilateral upper extremity supported;Feet unsupported Sitting balance-Leahy Scale: Fair                                     ADL either performed or assessed with clinical judgement   ADL Overall ADL's : Needs assistance/impaired Eating/Feeding: Set up;Sitting Eating/Feeding Details (indicate cue type and reason): long sitting in bed, set up of some tray items that pt had difficulty with (e.g., opening juice container) Grooming: Sitting;Supervision/safety;Set up   Upper Body Bathing: Sitting;Set up;Min guard   Lower Body Bathing: Sitting/lateral leans;Set up;Minimal assistance   Upper Body Dressing : Sitting;Set up;Min guard   Lower Body Dressing: Sitting/lateral leans;Minimal assistance;Set up                       Vision Patient Visual Report: No change from baseline       Perception     Praxis      Pertinent Vitals/Pain Pain Assessment: No/denies pain     Hand Dominance Right   Extremity/Trunk Assessment Upper Extremity Assessment Upper Extremity Assessment: Generalized weakness   Lower Extremity Assessment Lower Extremity Assessment: Generalized weakness(hx BKA)   Cervical / Trunk Assessment Cervical /  Trunk Assessment: Normal   Communication Communication Communication: No difficulties   Cognition Arousal/Alertness: Awake/alert Behavior During Therapy: WFL for tasks assessed/performed Overall Cognitive Status: Impaired/Different from baseline Area of Impairment:  Attention;Following commands;Problem solving                   Current Attention Level: Focused   Following Commands: Follows multi-step commands inconsistently     Problem Solving: Slow processing;Requires verbal cues     General Comments  bandage over L BKA    Exercises     Shoulder Instructions      Home Living Family/patient expects to be discharged to:: Skilled nursing facility                                 Additional Comments: per chart review, pt had been living in a mobile home prior to her stay at [facility]      Prior Functioning/Environment Level of Independence: Needs assistance  Gait / Transfers Assistance Needed: pt was w/c level prior to recent admissions ADL's / Homemaking Assistance Needed: pt required assist from staff for ADL             OT Problem List: Decreased strength;Decreased knowledge of use of DME or AE;Decreased cognition;Decreased safety awareness;Impaired balance (sitting and/or standing)      OT Treatment/Interventions: Self-care/ADL training;Balance training;Therapeutic exercise;Therapeutic activities;DME and/or AE instruction;Patient/family education    OT Goals(Current goals can be found in the care plan section) Acute Rehab OT Goals Patient Stated Goal: go back to rehab and keep getting stronger OT Goal Formulation: With patient Time For Goal Achievement: 07/23/18 Potential to Achieve Goals: Good ADL Goals Pt Will Perform Lower Body Bathing: with set-up;with supervision;sitting/lateral leans Pt Will Perform Lower Body Dressing: with set-up;with supervision;sitting/lateral leans Pt Will Transfer to Toilet: with min assist;with transfer board;bedside commode;with mod assist Pt/caregiver will Perform Home Exercise Program: Increased strength;Independently;With written HEP provided;Both right and left upper extremity  OT Frequency: Min 2X/week   Barriers to D/C:            Co-evaluation               AM-PAC OT "6 Clicks" Daily Activity     Outcome Measure Help from another person eating meals?: A Little Help from another person taking care of personal grooming?: A Little Help from another person toileting, which includes using toliet, bedpan, or urinal?: A Lot Help from another person bathing (including washing, rinsing, drying)?: A Little Help from another person to put on and taking off regular upper body clothing?: A Little Help from another person to put on and taking off regular lower body clothing?: A Little 6 Click Score: 17   End of Session    Activity Tolerance: Patient tolerated treatment well Patient left: in bed;with call bell/phone within reach;with bed alarm set  OT Visit Diagnosis: Other abnormalities of gait and mobility (R26.89);Muscle weakness (generalized) (M62.81);Other symptoms and signs involving cognitive function                Time: 7209-4709 OT Time Calculation (min): 17 min Charges:  OT General Charges $OT Visit: 1 Visit OT Evaluation $OT Eval Low Complexity: 1 Low  Jeni Salles, MPH, MS, OTR/L ascom [phone] 07/09/18, 5:23 PM

## 2018-07-09 NOTE — Progress Notes (Signed)
Patient refusing to have labs drawn at this time. Darlyn Chamber, NP informed.  Will re-attempt at a later time as patient has intermittent moments of irritability and pleasantness.

## 2018-07-09 NOTE — Progress Notes (Signed)
Doing much better and back to baseline.     Date last known well: Date: 07/08/2018 Time last known well: Time: 12:50 tPA Given: Yes  Past Medical History:  Diagnosis Date  . (HFpEF) heart failure with preserved ejection fraction ([place])    a. 01/2018 Echo: EF 50-55%, no rwma, Gr1 DD, triv AI, mild MR. Midly dil LA/RV, mod reduced RV fxn. Irreg thickening of TV w/ mobile echodensity that appears to arise from valve.  . Anemia   . Anorexia   . Bacteremia due to Pseudomonas   . Carotid arterial disease ([place])    a. 01/2017 Carotid U/S: RICA <16, LICA <10.  Marland Kitchen Complication of anesthesia    a. Pt reports h/o complication on 9 different occasions - ? hypotension/arrest.  . 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 01/2015 Cath [facility]): 70% LM, p-mLAD 50-60% (Resting FFR 0.75), mRCA 80-90%, ~40 Ost OM & D1-->CABG; b. 01/2016 Staged PCI of LCX x 2 and RCA.  Marland Kitchen ESRD (end stage renal disease) on dialysis ([place])    a. ESRD secondary to acute kidney failure s/p CABG-->PD  . Essential hypertension   . GERD (gastroesophageal reflux disease)   . Heart murmur   . Hypercholesterolemia   . Myocardial infarction ([place]) 2016  . PVD (peripheral vascular disease) ([place])    a. 01/30/2018 PV Angio: Sev Left Popliteal dzs s/p PTA w/ 33m lutonix DEB w/ mech aspiration of the L popliteal, L AT, and Left tibioperoneal trunck and peroneal artery.  . 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  [facility]: Dr. BMarland KitchenHaithcock: CABG x 3, LIMA to LAD, SVG to RCA, SVG to OM3, EVH  . Sinusitis 2019  . Type II diabetes mellitus with complication (HCC)    CAD    Past Surgical History:  Procedure Laterality Date  . ABDOMINAL HYSTERECTOMY    . AMPUTATION Right 02/25/2018   Procedure: AMPUTATION BELOW KNEE;  Surgeon: SKatha Cabal MD;  Location: [facility] ORS;  Service: Vascular;  Laterality: Right;  . AMPUTATION Left 03/11/2018   Procedure: AMPUTATION BELOW KNEE;  Surgeon: DAlgernon Huxley MD;  Location: [facility]  ORS;  Service: Vascular;  Laterality: Left;  . AMPUTATION Bilateral 04/13/2018   Procedure: REVISION OF BILATERAL AMPUTATIONS BELOW KNEE WITH WOUND VAC APPLICATIONS;  Surgeon: EEvaristo Bury MD;  Location: [facility] ORS;  Service: Vascular;  Laterality: Bilateral;  . AMPUTATION TOE Left 02/06/2018   Procedure: AMPUTATION GREAT TOE;  Surgeon: FSamara Deist DPM;  Location: [facility] ORS;  Service: Podiatry;  Laterality: Left;  . APPLICATION OF WOUND VAC Left 04/13/2018   Procedure: APPLICATION OF WOUND VAC;  Surgeon: EEvaristo Bury MD;  Location: [facility] ORS;  Service: Vascular;  Laterality: Left;  . ARTERIOVENOUS GRAFT PLACEMENT  05/2016  . AV FISTULA PLACEMENT Left 05/30/2016   Procedure: ARTERIOVENOUS graft;  Surgeon: JAlgernon Huxley MD;  Location: [facility] ORS;  Service: Vascular;  Laterality: Left;  . CAPD INSERTION N/A 10/30/2017   Procedure: LAPAROSCOPIC INSERTION CONTINUOUS AMBULATORY PERITONEAL DIALYSIS  (CAPD) CATHETER;  Surgeon: DAlgernon Huxley MD;  Location: [facility] ORS;  Service: Vascular;  Laterality: N/A;  . CARDIAC CATHETERIZATION  01/2015   [facility]: Ost LM 70%, p-m LAD 50-60% (Rest FFR + @ 0.75), mRCA 80-90%, ostD1 40%, pOM1 40%  . CATARACT EXTRACTION W/PHACO Left 01/18/2016   Procedure: CATARACT EXTRACTION PHACO AND INTRAOCULAR LENS PLACEMENT (IOC);  Surgeon: BEulogio Bear MD;  Location: [facility] ORS;  Service: Ophthalmology;  Laterality: Left;  [place]  1.05 AP% 15.5 CDE 10.16 Fluid Pack Lot # [id number]  . CATARACT EXTRACTION W/PHACO Right 08/01/2016   Procedure: CATARACT EXTRACTION PHACO AND INTRAOCULAR LENS PLACEMENT (IOC);  Surgeon: Eulogio Bear, MD;  Location: [facility] ORS;  Service: Ophthalmology;  Laterality: Right;  [place] 01:00.6 AP% 11.4 CDE 6.93  LOT # [id number]  . COLONOSCOPY    . CORONARY ANGIOPLASTY     SENTS 02/12/16  . CORONARY ARTERY BYPASS GRAFT  03/28/15    [facility]: Dr. Waldemar Dickens: LIMA to LAD, SVG to RCA, SVG to OM3, EVH  . DIALYSIS/PERMA CATHETER INSERTION N/A 02/11/2018   Procedure:  DIALYSIS/PERMA CATHETER INSERTION;  Surgeon: Algernon Huxley, MD;  Location: [place] [facility] LAB;  Service: Cardiovascular;  Laterality: N/A;  . DIALYSIS/PERMA CATHETER REMOVAL Right 02/04/2018   Procedure: DIALYSIS/PERMA CATHETER REMOVAL;  Surgeon: Katha Cabal, MD;  Location: [place] [facility];  Service: Cardiovascular;  Laterality: Right;  . ESOPHAGOGASTRODUODENOSCOPY (EGD) WITH PROPOFOL N/A 10/24/2016   Procedure: ESOPHAGOGASTRODUODENOSCOPY (EGD) WITH PROPOFOL;  Surgeon: [name], MD;  Location: [facility] ENDOSCOPY;  Service: Endoscopy;  Laterality: N/A;  . EYE SURGERY Bilateral    cataract surgery  . EYE SURGERY     drains for glaucoma  . INSERTION EXPRESS TUBE SHUNT Right 08/01/2016   Procedure: INSERTION EXPRESS TUBE SHUNT;  Surgeon: Eulogio Bear, MD;  Location: [facility] ORS;  Service: Ophthalmology;  Laterality: Right;  . INSERTION OF AHMED VALVE Left 08/15/2016   Procedure: INSERTION OF AHMED VALVE;  Surgeon: Eulogio Bear, MD;  Location: [facility] ORS;  Service: Ophthalmology;  Laterality: Left;  . INSERTION OF DIALYSIS CATHETER    . LOWER EXTREMITY ANGIOGRAPHY Right 02/19/2018   Procedure: Lower Extremity Angiography;  Surgeon: Algernon Huxley, MD;  Location: [place] [facility];  Service: Cardiovascular;  Laterality: Right;  . LOWER EXTREMITY ANGIOGRAPHY Left 01/30/2018   Procedure: Lower Extremity Angiography;  Surgeon: Katha Cabal, MD;  Location: [place] [facility];  Service: Cardiovascular;  Laterality: Left;  . REMOVAL OF A DIALYSIS CATHETER N/A 02/25/2018   Procedure: REMOVAL OF A DIALYSIS CATHETER;  Surgeon: Katha Cabal, MD;  Location: [facility] ORS;  Service: Vascular;  Laterality: N/A;  . TEMPORARY DIALYSIS CATHETER N/A 02/06/2018   Procedure: TEMPORARY DIALYSIS CATHETER;  Surgeon: Katha Cabal, MD;  Location: [place] [facility];  Service: Cardiovascular;  Laterality: N/A;  . TRANSTHORACIC ECHOCARDIOGRAM  January 2017    EF 60-65%. GR 2 DD. Mild  degenerative mitral valve disease but no prolapse or regurgitation. Mild left atrial dilation. Mild to moderate LVH. Pericardial effusion gone  . UPPER EXTREMITY ANGIOGRAPHY Left 06/27/2016   Procedure: Upper Extremity Angiography;  Surgeon: Algernon Huxley, MD;  Location: [place] [facility];  Service: Cardiovascular;  Laterality: Left;  . UPPER EXTREMITY ANGIOGRAPHY Left 08/22/2016   Procedure: Upper Extremity Angiography;  Surgeon: Algernon Huxley, MD;  Location: [place] [facility];  Service: Cardiovascular;  Laterality: Left;  Marland Kitchen VASCULAR SURGERY    . WOUND DEBRIDEMENT Left 05/08/2018   Procedure: DEBRIDEMENT OF LEFT LEG BKA;  Surgeon: Algernon Huxley, MD;  Location: [facility] ORS;  Service: General;  Laterality: Left;    Family History  Problem Relation Age of Onset  . Diabetes Mellitus II Mother   . Heart failure Mother   . Pancreatic cancer Father    Social History:  reports that she has never smoked. She has never used smokeless tobacco. She reports that she does not drink alcohol or use drugs.  Allergies:  Allergies  Allergen Reactions  . Chlorthalidone Anaphylaxis, Itching and Rash  . Fentanyl Rash  . Midazolam Rash  . Ace Inhibitors Other (See Comments)    Reaction:  Hyperkalemia, agitation   . Angiotensin Receptor Blockers Other (See Comments)    Reaction:  Hyperkalemia, agitation   . Norvasc [Amlodipine] Itching and Rash  . Phenergan [Promethazine Hcl] Anxiety    "antsy, can't sit still"    Medications: I have reviewed the patient's current medications. Prior to Admission:  Medications Prior to Admission  Medication Sig Dispense Refill Last Dose  . acetaminophen (TYLENOL) 325 MG tablet Take 2 tablets (650 mg total) by mouth every 6 (six) hours as needed for mild pain (or Fever >/= 101).   Unknown at PRN  . apixaban (ELIQUIS) 2.5 MG TABS tablet Take 1 tablet (2.5 mg total) by mouth 2 (two) times daily. 60 tablet 0 07/08/2018 at 0800  . aspirin EC 81 MG tablet Take 1 tablet (81 mg  total) by mouth daily.   07/08/2018 at 0800  . atorvastatin (LIPITOR) 80 MG tablet Take 80 mg by mouth at bedtime.    07/07/2018 at 2000  . brimonidine (ALPHAGAN) 0.2 % ophthalmic solution Place 1 drop into both eyes 3 (three) times daily. 5 mL 12 07/08/2018 at 0800  . carvedilol (COREG) 6.25 MG tablet Take 1 tablet (6.25 mg total) by mouth 2 (two) times daily.   07/08/2018 at 0800  . docusate sodium (COLACE) 100 MG capsule Take 1 capsule (100 mg total) by mouth 2 (two) times daily. 10 capsule 0 07/08/2018 at 0800  . epoetin alfa (EPOGEN,PROCRIT) 16073 UNIT/ML injection Inject 1 mL (10,000 Units total) into the vein Every Tuesday,Thursday,and Saturday with dialysis. 1 mL  As directed at As directed  . famotidine (PEPCID) 20 MG tablet Take 20 mg by mouth 2 (two) times daily.   07/08/2018 at 0800  . folic acid (FOLVITE) 1 MG tablet Take 1 tablet (1 mg total) by mouth daily. 30 tablet 3 07/08/2018 at 0800  . isosorbide mononitrate (IMDUR) 30 MG 24 hr tablet Take 30 mg by mouth daily.    07/08/2018 at 0800  . levETIRAcetam (KEPPRA) 250 MG tablet Three times weekly after dialysis (Patient taking differently: Take 250 mg by mouth See admin instructions. Take 1 tablet (266m) by mouth every Tuesday, Thursday and Saturday afternoon)   07/07/2018 at 1600  . levETIRAcetam (KEPPRA) 500 MG tablet Take 1 tablet (500 mg total) by mouth 2 (two) times daily.   07/08/2018 at 0800  . midodrine (PROAMATINE) 10 MG tablet Take 1 tablet (10 mg total) by mouth 3 (three) times a week. (Patient taking differently: Take 10 mg by mouth See admin instructions. Take 1 tablet (137m by mouth every Tuesday, Thursday and Saturday) 30 tablet 0 07/07/2018 at Unknown time  . mirtazapine (REMERON) 7.5 MG tablet Take 7.5 mg by mouth daily.    07/07/2018 at Unknown time  . multivitamin (RENA-VIT) TABS tablet Take 1 tablet by mouth at bedtime. 30 tablet 0 07/08/2018 at 0800  . nitroGLYCERIN (NITROSTAT) 0.4 MG SL tablet Place 1 tablet (0.4 mg total)  under the tongue every 5 (five) minutes as needed for chest pain. 30 tablet 0 Unknown at PRN  . Nutritional Supplements (FEEDING SUPPLEMENT, NEPRO CARB STEADY,) LIQD Take 237 mLs by mouth 2 (two) times daily between meals. 60 Can 0 06/09/2018 at Unknown time  . Omega-3 1000 MG CAPS Take 2 capsules by mouth daily.    07/08/2018 at 0800  .  ondansetron (ZOFRAN) 4 MG tablet Take 4 mg by mouth every 8 (eight) hours as needed for nausea or vomiting.   Unknown at PRN  . Polyethyl Glycol-Propyl Glycol 0.4-0.3 % SOLN Place 1 drop into both eyes 2 (two) times daily.   07/08/2018 at 0800  . rOPINIRole (REQUIP) 0.25 MG tablet Take 1 tablet (0.25 mg total) by mouth at bedtime.   07/07/2018 at 2000  . sevelamer carbonate (RENVELA) 800 MG tablet Take 1,600 mg by mouth 3 (three) times daily with meals.    07/08/2018 at 0800  . timolol (TIMOPTIC) 0.5 % ophthalmic solution Place 1 drop into both eyes 3 (three) times daily. 10 mL 12 07/08/2018 at 0800  . traMADol (ULTRAM) 50 MG tablet Take 1 tablet (50 mg total) by mouth every 8 (eight) hours as needed for moderate pain or severe pain. 15 tablet 0 Unknown at PRN  . valsartan (DIOVAN) 80 MG tablet Take 80 mg by mouth daily.    07/08/2018 at 0800  . vitamin B-12 (CYANOCOBALAMIN) 1000 MCG tablet Take 1 tablet (1,000 mcg total) by mouth daily. 30 tablet 0 07/08/2018 at 0800  . vitamin C (VITAMIN C) 500 MG tablet Take 1 tablet (500 mg total) by mouth 2 (two) times daily. 60 tablet 0 07/08/2018 at 0800   Prior to Admission medications   Medication Sig Start Date End Date Taking? Authorizing Provider  acetaminophen (TYLENOL) 325 MG tablet Take 2 tablets (650 mg total) by mouth every 6 (six) hours as needed for mild pain (or Fever >/= 101). 03/23/18   [name], MD  apixaban (ELIQUIS) 2.5 MG TABS tablet Take 1 tablet (2.5 mg total) by mouth 2 (two) times daily. 02/15/18   Vaughan Basta, MD  aspirin EC 81 MG tablet Take 1 tablet (81 mg total) by mouth daily. 10/19/16   Dustin Flock, MD  atorvastatin (LIPITOR) 80 MG tablet Take 80 mg by mouth at bedtime.  05/05/17   [provider]  brimonidine (ALPHAGAN) 0.2 % ophthalmic solution Place 1 drop into both eyes 3 (three) times daily. 03/18/18   Bettey Costa, MD  calcitRIOL (ROCALTROL) 0.5 MCG capsule Take 0.5 mcg by mouth daily.    [provider]  carvedilol (COREG) 6.25 MG tablet Take 1 tablet (6.25 mg total) by mouth 2 (two) times daily. 03/18/18   Bettey Costa, MD  ciprofloxacin (CIPRO) 500 MG tablet Take 1 tablet (500 mg total) by mouth at bedtime. Patient not taking: Reported on 05/21/2018 05/12/18   Loletha Grayer, MD  docusate sodium (COLACE) 100 MG capsule Take 1 capsule (100 mg total) by mouth 2 (two) times daily. 02/15/18   Vaughan Basta, MD  epoetin alfa (EPOGEN,PROCRIT) 16109 UNIT/ML injection Inject 1 mL (10,000 Units total) into the vein Every Tuesday,Thursday,and Saturday with dialysis. 03/24/18   [name], MD  famotidine (PEPCID) 20 MG tablet Take 20 mg by mouth 2 (two) times daily.    [provider]  folic acid (FOLVITE) 1 MG tablet Take 1 tablet (1 mg total) by mouth daily. 02/15/18 02/15/19  Vaughan Basta, MD  isosorbide mononitrate (IMDUR) 30 MG 24 hr tablet Take 30 mg by mouth daily.     [provider]  levETIRAcetam (KEPPRA) 250 MG tablet Three times weekly after dialysis 05/12/18   Loletha Grayer, MD  levETIRAcetam (KEPPRA) 500 MG tablet Take 1 tablet (500 mg total) by mouth 2 (two) times daily. 03/23/18   [name], MD  metroNIDAZOLE (FLAGYL) 500 MG tablet Take 1 tablet (500 mg  total) by mouth every 12 (twelve) hours. Patient not taking: Reported on 06/10/2018 05/12/18   Loletha Grayer, MD  midodrine (PROAMATINE) 10 MG tablet Take 1 tablet (10 mg total) by mouth 3 (three) times a week. Patient taking differently: Take 10 mg by mouth 3 (three) times a week. Take Tuesday, Thursday, and Saturday 02/17/18   Vaughan Basta, MD   mirtazapine (REMERON) 7.5 MG tablet Take by mouth. 12/18/17   [provider]  Multiple Vitamins-Minerals (PRORENAL + D) TABS Take 1 tablet by mouth daily.    [provider]  multivitamin (RENA-VIT) TABS tablet Take 1 tablet by mouth at bedtime. 05/12/18   Loletha Grayer, MD  nitroGLYCERIN (NITROSTAT) 0.4 MG SL tablet Place 1 tablet (0.4 mg total) under the tongue every 5 (five) minutes as needed for chest pain. 09/05/16   Loletha Grayer, MD  Nutritional Supplements (FEEDING SUPPLEMENT, NEPRO CARB STEADY,) LIQD Take 237 mLs by mouth 2 (two) times daily between meals. 05/12/18   Loletha Grayer, MD  Omega-3 1000 MG CAPS Take 2 capsules by mouth daily.     [provider]  ondansetron (ZOFRAN) 4 MG tablet Take 4 mg by mouth every 8 (eight) hours as needed for nausea or vomiting.    [provider]  Polyethyl Glycol-Propyl Glycol 0.4-0.3 % SOLN Place 1 drop into both eyes 2 (two) times daily.    [provider]  rOPINIRole (REQUIP) 0.25 MG tablet Take 1 tablet (0.25 mg total) by mouth at bedtime. 05/12/18   Loletha Grayer, MD  sevelamer carbonate (RENVELA) 800 MG tablet Take 800 mg by mouth 3 (three) times daily with meals.    [provider]  timolol (TIMOPTIC) 0.5 % ophthalmic solution Place 1 drop into both eyes 3 (three) times daily. 03/18/18   Bettey Costa, MD  traMADol (ULTRAM) 50 MG tablet Take 1 tablet (50 mg total) by mouth every 8 (eight) hours as needed for moderate pain or severe pain. 05/12/18   Loletha Grayer, MD  valsartan (DIOVAN) 80 MG tablet Take by mouth. 12/03/17 12/03/18  [provider]  vitamin B-12 (CYANOCOBALAMIN) 1000 MCG tablet Take 1 tablet (1,000 mcg total) by mouth daily. 02/15/18   Vaughan Basta, MD  vitamin C (VITAMIN C) 500 MG tablet Take 1 tablet (500 mg total) by mouth 2 (two) times daily. 05/12/18   Loletha Grayer, MD   Scheduled: . atorvastatin  80 mg Oral QHS  . brimonidine  1 drop Both  Eyes TID  . calcitRIOL  0.5 mcg Oral Daily  . Chlorhexidine Gluconate Cloth  6 each Topical Q0600  . mirtazapine  7.5 mg Oral QHS  . mupirocin ointment  1 application Nasal BID  . polyvinyl alcohol  1 drop Both Eyes BID  . sevelamer carbonate  800 mg Oral TID WC  . timolol  1 drop Both Eyes TID    ROS: Unable to obtain from patient due to current altered mental status  Physical Examination: Blood pressure (!) 145/77, pulse 72, temperature 97.8 F (36.6 C), temperature source Oral, resp. rate 14, weight 51.8 kg, SpO2 96 %.   HEENT-  Normocephalic, no lesions, without obvious abnormality.  Normal external eye and conjunctiva.  Normal TM's bilaterally.  Normal auditory canals and external ears. Normal external nose, mucus membranes and septum.  Normal pharynx. Cardiovascular- S1, S2 normal, pulses palpable throughout   Lungs- chest clear, no wheezing, rales, normal symmetric air entry Abdomen- soft, non-tender; bowel sounds normal; no masses,  no organomegaly Extremities- no edema Lymph-no  adenopathy palpable Musculoskeletal-no joint tenderness, deformity or swelling Skin-warm and dry, no hyperpigmentation, vitiligo, or suspicious lesions  Neurological Exam   Mental Status: Alert, unable to assess orientation due to significant expressive and receptive aphasia. Unable to follow commands. Attention span and concentration seemed appropriate  Cranial Nerves: II: Discs flat bilaterally; does not blink to visual threats bilaterally. pupils equal, round, reactive to light and accommodation III,IV, VI: ptosis not present, attempts to track but not consistent V,VII: smile asymmetric, mild left facial droop.  facial light touch sensation intact VIII: hearing normal bilaterally IX,X: Deffered XI: Unable to assess XII: midline tongue extension Motor: Able to break gravity in both upper extremity Right:   BKA                                         Left: BKA Tone and bulk:normal tone  throughout; no atrophy noted Sensory: Responds to noxious stimuli bilaterally Deep Tendon Reflexes: 2+ and symmetric throughout Plantars: Bilateral BKA Cerebellar: Unable to assess Gait: not tested due to safety concerns  Data Reviewed  Laboratory Studies:  Basic Metabolic Panel: Recent Labs  Lab 07/08/18 1357 07/09/18 0409  NA 137 138  K 4.4 4.8  CL 97* 99  CO2 23 23  GLUCOSE 126* 83  BUN 43* 61*  CREATININE 6.00* 7.17*  CALCIUM 8.0* 8.1*  MG  --  2.2    Liver Function Tests: Recent Labs  Lab 07/08/18 1357  AST 22  ALT 17  ALKPHOS 85  BILITOT 0.4  PROT 7.2  ALBUMIN 3.5   No results for input(s): LIPASE, AMYLASE in the last 168 hours. Recent Labs  Lab 07/09/18 0409  AMMONIA <9*    CBC: Recent Labs  Lab 07/08/18 1357 07/09/18 0409  WBC 8.4 7.6  NEUTROABS 6.5  --   HGB 14.1 13.1  HCT 47.0* 43.7  MCV 95.1 95.6  PLT 127* 128*    Cardiac Enzymes: Recent Labs  Lab 07/08/18 1357 07/08/18 2012 07/09/18 0409  TROPONINI 0.09* 0.11* 0.15*    BNP: Invalid input(s): POCBNP  CBG: Recent Labs  Lab 07/08/18 1331 07/08/18 1544  GLUCAP 97 108*    Microbiology: Results for orders placed or performed during the hospital encounter of 07/08/18  MRSA PCR Screening     Status: Abnormal   Collection Time: 07/08/18  3:42 PM  Result Value Ref Range Status   MRSA by PCR POSITIVE (A) NEGATIVE Final    Comment:        The GeneXpert MRSA Assay (FDA approved for NASAL specimens only), is one component of a comprehensive MRSA colonization surveillance program. It is not intended to diagnose MRSA infection nor to guide or monitor treatment for MRSA infections. RESULT CALLED TO, READ BACK BY AND VERIFIED WITH: DILLON WHITE 07/08/18 @ [place] Performed at [facility], [address] [place], [place] [address]     Coagulation Studies: Recent Labs    07/08/18 1357  LABPROT 15.3*  INR 1.2    Urinalysis: No results for input(s):  COLORURINE, LABSPEC, PHURINE, GLUCOSEU, HGBUR, BILIRUBINUR, KETONESUR, PROTEINUR, UROBILINOGEN, NITRITE, LEUKOCYTESUR in the last 168 hours.  Invalid input(s): APPERANCEUR  Lipid Panel:    Component Value Date/Time   CHOL 153 07/08/2018 1357   TRIG 313 (H) 07/08/2018 1357   HDL 35 (L) 07/08/2018 1357   CHOLHDL 4.4 07/08/2018 1357   VLDL 63 (H) 07/08/2018 1357  LDLCALC 55 07/08/2018 1357    HgbA1C:  Lab Results  Component Value Date   HGBA1C 7.1 (H) 07/08/2018    Urine Drug Screen:  No results found for: LABOPIA, COCAINSCRNUR, LABBENZ, AMPHETMU, THCU, LABBARB  Alcohol Level:  Recent Labs  Lab 07/08/18 1357  ETH <10    Other results: EKG: atrial fibrillation, rate 70.  Imaging: Mr Virgel Paling QM Contrast  Result Date: 07/08/2018 CLINICAL DATA:  Altered mental status. Speech disturbance. Dialysis patient. EXAM: MRI HEAD WITHOUT CONTRAST MRA HEAD WITHOUT CONTRAST TECHNIQUE: Multiplanar, multiecho pulse sequences of the brain and surrounding structures were obtained without intravenous contrast. Angiographic images of the head were obtained using MRA technique without contrast. COMPARISON:  Head CT same day.  MRI 03/14/2018. FINDINGS: MRI HEAD FINDINGS Brain: The study suffers from some motion degradation. Diffusion imaging does not show any acute or subacute infarction. The brainstem is normal. Few old small vessel cerebellar infarctions. Cerebral hemispheres show age related atrophy with mild to moderate chronic small-vessel ischemic changes of the white matter. Old small vessel thalamic infarction on the left. No cortical or large vessel territory infarction. No mass lesion, hemorrhage, hydrocephalus or extra-axial collection. Vascular: Major vessels at the base of the brain show flow. Skull and upper cervical spine: Negative Sinuses/Orbits: Sphenoid sinusitis. Other sinuses are clear. Orbits negative. Other: None MRA HEAD FINDINGS Severe motion degradation. Both internal carotid  arteries show flow. There is flow in the anterior and middle cerebral vessels. Both vertebral arteries are patent to the basilar. The basilar artery and major posterior circulation branch vessels show flow. Detail beyond that is not reliable. IMPRESSION: No acute brain finding. Chronic small-vessel ischemic changes of the cerebellum, left thalamus and cerebral hemispheric white matter. MR angiogram is markedly degraded by motion. There does not appear to be any large vessel occlusion. Beyond that, detail is not reliable because of motion. Electronically Signed   By: Nelson Chimes M.D.   On: 07/08/2018 17:08   Mr Brain Wo Contrast  Result Date: 07/08/2018 CLINICAL DATA:  Altered mental status. Speech disturbance. Dialysis patient. EXAM: MRI HEAD WITHOUT CONTRAST MRA HEAD WITHOUT CONTRAST TECHNIQUE: Multiplanar, multiecho pulse sequences of the brain and surrounding structures were obtained without intravenous contrast. Angiographic images of the head were obtained using MRA technique without contrast. COMPARISON:  Head CT same day.  MRI 03/14/2018. FINDINGS: MRI HEAD FINDINGS Brain: The study suffers from some motion degradation. Diffusion imaging does not show any acute or subacute infarction. The brainstem is normal. Few old small vessel cerebellar infarctions. Cerebral hemispheres show age related atrophy with mild to moderate chronic small-vessel ischemic changes of the white matter. Old small vessel thalamic infarction on the left. No cortical or large vessel territory infarction. No mass lesion, hemorrhage, hydrocephalus or extra-axial collection. Vascular: Major vessels at the base of the brain show flow. Skull and upper cervical spine: Negative Sinuses/Orbits: Sphenoid sinusitis. Other sinuses are clear. Orbits negative. Other: None MRA HEAD FINDINGS Severe motion degradation. Both internal carotid arteries show flow. There is flow in the anterior and middle cerebral vessels. Both vertebral arteries are  patent to the basilar. The basilar artery and major posterior circulation branch vessels show flow. Detail beyond that is not reliable. IMPRESSION: No acute brain finding. Chronic small-vessel ischemic changes of the cerebellum, left thalamus and cerebral hemispheric white matter. MR angiogram is markedly degraded by motion. There does not appear to be any large vessel occlusion. Beyond that, detail is not reliable because of motion. Electronically Signed  By: Nelson Chimes M.D.   On: 07/08/2018 17:08   US Carotid Bilateral  Result Date: 07/09/2018 CLINICAL DATA:  CVA.  Altered mental status. EXAM: BILATERAL CAROTID DUPLEX ULTRASOUND TECHNIQUE: Pearline Cables scale imaging, color Doppler and duplex ultrasound were performed of bilateral carotid and vertebral arteries in the neck. COMPARISON:  03/14/2018 FINDINGS: Criteria: Quantification of carotid stenosis is based on velocity parameters that correlate the residual internal carotid diameter with NASCET-based stenosis levels, using the diameter of the distal internal carotid lumen as the denominator for stenosis measurement. The following velocity measurements were obtained: RIGHT ICA: 65 cm/sec CCA: 93 cm/sec SYSTOLIC ICA/CCA RATIO:  0.6 ECA: 61 cm/sec LEFT ICA: 85 cm/sec CCA: 71 cm/sec SYSTOLIC ICA/CCA RATIO:  1.2 ECA: 62 cm/sec RIGHT CAROTID ARTERY: Again noted is extensive echogenic plaque in the right common carotid artery. No significant stenosis based on the velocity measurements and waveforms. Echogenic plaque at the right carotid bulb. External carotid artery is patent with normal waveform. Echogenic plaque in the internal carotid artery. Normal waveforms and velocities in the internal carotid artery. Portion of the distal internal carotid artery is not well visualized due to shadowing plaque. RIGHT VERTEBRAL ARTERY: Antegrade flow and normal waveform in the right vertebral artery. LEFT CAROTID ARTERY: Extensive echogenic plaque in the proximal left common  carotid artery. Echogenic plaque at the left carotid bulb. External carotid artery is patent with normal waveform. Echogenic and shadowing plaque in the internal carotid artery. Normal waveforms and velocities in the internal carotid artery. LEFT VERTEBRAL ARTERY: Antegrade flow and normal waveform in the left vertebral artery. IMPRESSION: Extensive echogenic calcified plaque throughout the bilateral carotid arteries without significant stenosis. Estimated degree of stenosis in the internal carotid arteries is less than 50% bilaterally. Patent vertebral arteries with antegrade flow. Electronically Signed   By: Markus Daft M.D.   On: 07/09/2018 10:53   Dg Chest Portable 1 View  Result Date: 07/08/2018 CLINICAL DATA:  Altered mental status. EXAM: PORTABLE CHEST 1 VIEW COMPARISON:  04/23/2018. FINDINGS: Stable enlarged cardiac silhouette. Interval mild patchy opacities in both lungs, right greater than left. Stable post CABG changes. Stable left jugular double-lumen catheter with its tip in the mid right atrium. Unremarkable bones. IMPRESSION: 1. Interval mild bilateral alveolar edema or pneumonia, right greater than left. 2. Stable cardiomegaly. Electronically Signed   By: Claudie Revering M.D.   On: 07/08/2018 17:41   Ct Head Code Stroke Wo Contrast  Addendum Date: 07/08/2018   ADDENDUM REPORT: 07/08/2018 14:15 ADDENDUM: Study discussed by telephone with Dr. Eula Listen on 07/08/2018 at 1357 hours. Electronically Signed   By: Genevie Ann M.D.   On: 07/08/2018 14:15   Result Date: 07/08/2018 CLINICAL DATA:  Code stroke. 68 year old female with left facial droop and syncope. Last known well 1250 hours. EXAM: CT HEAD WITHOUT CONTRAST TECHNIQUE: Contiguous axial images were obtained from the base of the skull through the vertex without intravenous contrast. COMPARISON:  Head CT 03/17/2018. Brain MRI 03/14/2018 and earlier. FINDINGS: Brain: No midline shift, mass effect, or evidence of intracranial mass lesion.  No ventriculomegaly. No acute intracranial hemorrhage identified. New heterogeneity of the left thalamus on series 2, image 14, but this would result in right side symptoms. Gray-white matter differentiation otherwise appears stable. No cortically based acute infarct identified. Vascular: Extensive Calcified atherosclerosis at the skull base. No suspicious intracranial vascular hyperdensity. Skull: Negative. Sinuses/Orbits: Chronic left sphenoid sinusitis, otherwise stable and well pneumatized. Other: No acute orbit or scalp soft tissue findings. There is some  calcified scalp vessel atherosclerosis. ASPECTS [facility] Stroke Program Early CT Score) - Ganglionic level infarction (caudate, lentiform nuclei, internal capsule, insula, M1-M3 cortex): 7 - Supraganglionic infarction (M4-M6 cortex): 3 Total score (0-10 with 10 being normal): 10 IMPRESSION: 1. 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 2. No acute cortically based infarct or acute intracranial hemorrhage identified. ASPECTS is 10. 3. Calcified atherosclerosis, including in scalp vessels which can be seen in the setting of in stage renal disease. Electronically Signed: By: Genevie Ann M.D. On: 07/08/2018 13:52   Assessment: 68 y.o. female with past medical history of seizure disorder, PVD, ESRD on hemodialysis, diabetes mellitus, chronic diastolic CHF, bilateral below-knee amputation, endocarditis of tricuspid valve, CAD, MI, remote history of atrial fibrillation with RVR presenting to the ED from hemodialysis center with altered mental status. Concerns for ischemic event.  Initial CT head reviewed and shows age indeterminate left thalamic lacunar infarct new since November otherwise no acute cortically based infarct or acute intracranial hemorrhage identified. Determination was made that patient was eligible for IV tPA after  benefit-to-harm ratio of alteplase was assessed and consent obtained  from next of kin. IV alteplase infusion was started at 1401 however prior to completion of the infusion, it was found that patient was on anticoagulation Eliquis for Afib. This information was not readily available in patient's chart and medication administration record from the Dialysis center. Infusion was stopped immediately. Neuro-exam remains stable without new focality.  Stroke Risk Factors - atrial fibrillation, hypertension, DM, family history.  - Improved and appears back to baseline - Repeat CTH now at 1400.  - If no acute abnormalities pt can be transferred out of ICU with neurochecks q 4 hrs - If CTH no signs of hemorrhage please restart her eliquis   07/09/2018, 1:43 PM

## 2018-07-09 NOTE — Progress Notes (Signed)
[place] [facility] [place] NAME: Stephanie Ellis    MR#:  [id number]  DATE OF BIRTH:  1950-07-25  SUBJECTIVE:  CHIEF COMPLAINT:   Chief Complaint  Patient presents with   Altered Mental Status   Refused EEG today. Plan for dialysis today or tomorrow. Complains of chronic bilateral leg pain, especially at left BKA site. Feels "better". Knows who she is and where she is. Asks for help calling her daughters.  REVIEW OF SYSTEMS:  Review of Systems  Constitutional: Negative for chills, fever and malaise/fatigue.  HENT: Negative for congestion, sinus pain and sore throat.   Eyes: Negative for blurred vision, double vision and pain.       Positive vision loss left eye from "old infection"   Respiratory: Negative for cough, shortness of breath and wheezing.   Cardiovascular: Negative for chest pain, palpitations and leg swelling.  Gastrointestinal: Negative for abdominal pain, nausea and vomiting.  Genitourinary: Negative for dysuria and flank pain.  Musculoskeletal: Positive for joint pain.  Skin: Negative for itching and rash.  Neurological: Negative for dizziness, focal weakness, weakness and headaches.  Psychiatric/Behavioral: Negative for depression and hallucinations. The patient is not nervous/anxious.     DRUG ALLERGIES:   Allergies  Allergen Reactions   Chlorthalidone Anaphylaxis, Itching and Rash   Fentanyl Rash   Midazolam Rash   Ace Inhibitors Other (See Comments)    Reaction:  Hyperkalemia, agitation    Angiotensin Receptor Blockers Other (See Comments)    Reaction:  Hyperkalemia, agitation    Norvasc [Amlodipine] Itching and Rash   Phenergan [Promethazine Hcl] Anxiety    "antsy, can't sit still"   VITALS:  Blood pressure (!) 126/97, pulse 66, temperature 98.2 F (36.8 C), temperature source Oral, resp. rate 16, height 5' 4"  (1.626 m), weight 51.8 kg, SpO2 96 %. PHYSICAL EXAMINATION:  GENERAL:  68 y.o.-year-old  patient lying in the bed with no acute distress.  EYES: Left pupil irregular and dilated, right pupil reactive to light HEENT: Head atraumatic, normocephalic.  Patient refused to open mouth NECK:  Supple, no jugular venous distention. No thyroid enlargement, no tenderness.  LUNGS: Normal breath sounds bilaterally, no wheezing, rales,rhonchi or crepitation. No use of accessory muscles of respiration.  CARDIOVASCULAR: S1, S2 normal.  2/6 systolic murmur.no rubs, or gallops.  ABDOMEN: Soft, nontender, nondistended. Bowel sounds present.  EXTREMITIES: No pedal edema, cyanosis, or clubbing.  Left lower extremity large opening on skin with some greenish discoloration.  Bilateral lower extremity BKAs NEUROLOGIC: Unable to perform finger to nose testing, grip strength equal bilaterally. Facial movements symmetrical. No slurred speech. Moving extremities in bed without difficulty. Sensation intact. Gait not checked. PSYCHIATRIC: The patient is alert and oriented to self and president. When asked where she is: "[facility]", when asked what the year is: "1920" SKIN: Large left lower extremity stump ulceration with some greenish discoloration. LABORATORY PANEL:  Female CBC Recent Labs  Lab 07/09/18 0409  WBC 7.6  HGB 13.1  HCT 43.7  PLT 128*   ------------------------------------------------------------------------------------------------------------------ Chemistries  Recent Labs  Lab 07/08/18 1357 07/09/18 0409  NA 137 138  K 4.4 4.8  CL 97* 99  CO2 23 23  GLUCOSE 126* 83  BUN 43* 61*  CREATININE 6.00* 7.17*  CALCIUM 8.0* 8.1*  MG  --  2.2  AST 22  --   ALT 17  --   ALKPHOS 85  --   BILITOT 0.4  --    RADIOLOGY:  Ct Head Wo Contrast  Result Date: 07/09/2018 CLINICAL DATA:  Altered mental status. Speech disturbance. Patient status post administration of TPA 07/08/2018. EXAM: CT HEAD WITHOUT CONTRAST TECHNIQUE: Contiguous axial images were obtained from the base of the skull  through the vertex without intravenous contrast. COMPARISON:  Head CT scan 0 3/had CT scan and brain MRI 07/08/2018 FINDINGS: Brain: No evidence of acute infarction, hemorrhage, hydrocephalus, extra-axial collection or mass lesion/mass effect. Chronic microvascular ischemic change noted. Vascular: Atherosclerosis is seen. Skull: Intact.  No focal lesion. Sinuses/Orbits: Complete opacification left sphenoid sinus is unchanged. Other: None. IMPRESSION: No acute abnormality. Atrophy and chronic microvascular ischemic change. Atherosclerosis. Chronic left sphenoid sinus disease. Electronically Signed   By: Inge Rise M.D.   On: 07/09/2018 14:17   US Carotid Bilateral  Result Date: 07/09/2018 CLINICAL DATA:  CVA.  Altered mental status. EXAM: BILATERAL CAROTID DUPLEX ULTRASOUND TECHNIQUE: Pearline Cables scale imaging, color Doppler and duplex ultrasound were performed of bilateral carotid and vertebral arteries in the neck. COMPARISON:  03/14/2018 FINDINGS: Criteria: Quantification of carotid stenosis is based on velocity parameters that correlate the residual internal carotid diameter with NASCET-based stenosis levels, using the diameter of the distal internal carotid lumen as the denominator for stenosis measurement. The following velocity measurements were obtained: RIGHT ICA: 65 cm/sec CCA: 93 cm/sec SYSTOLIC ICA/CCA RATIO:  0.6 ECA: 61 cm/sec LEFT ICA: 85 cm/sec CCA: 71 cm/sec SYSTOLIC ICA/CCA RATIO:  1.2 ECA: 62 cm/sec RIGHT CAROTID ARTERY: Again noted is extensive echogenic plaque in the right common carotid artery. No significant stenosis based on the velocity measurements and waveforms. Echogenic plaque at the right carotid bulb. External carotid artery is patent with normal waveform. Echogenic plaque in the internal carotid artery. Normal waveforms and velocities in the internal carotid artery. Portion of the distal internal carotid artery is not well visualized due to shadowing plaque. RIGHT VERTEBRAL ARTERY:  Antegrade flow and normal waveform in the right vertebral artery. LEFT CAROTID ARTERY: Extensive echogenic plaque in the proximal left common carotid artery. Echogenic plaque at the left carotid bulb. External carotid artery is patent with normal waveform. Echogenic and shadowing plaque in the internal carotid artery. Normal waveforms and velocities in the internal carotid artery. LEFT VERTEBRAL ARTERY: Antegrade flow and normal waveform in the left vertebral artery. IMPRESSION: Extensive echogenic calcified plaque throughout the bilateral carotid arteries without significant stenosis. Estimated degree of stenosis in the internal carotid arteries is less than 50% bilaterally. Patent vertebral arteries with antegrade flow. Electronically Signed   By: Markus Daft M.D.   On: 07/09/2018 10:53   ASSESSMENT AND PLAN:   68 year old patient with past medical history of diabetes, sinusitis, CAD s/p CABG x 3, PVD, s/p bilateral BKAs, atrial fibrillation, hyperlipidemia, GERD, HTN, ESRD on hemodialysis, CHF who presented to the ED with altered mental status following dialysis. Was initially unresponsive on arrival. Started on tPA in the ED. Infusion was started at 1401 however prior to completion of the infusion, it was found that patient was on anticoagulation Eliquis for Afib. Now in the ICU primary management by the critical care MD:   Acute metabolic encephalopathy, resolving Improved and back to baseline per Neurology Patient has a documented history of known seizure disorder, refused EEG today for unknown reasons.  Neurology consulted appreciate input-s/p tPA for CVA-neuromonitoring in progress, frequent neuro checks   -brain imaging reassuring with chronic stable findings -US carotid-pending -repeat CT head - no hemorrhage - MRI no acute findings, chronic small vessel ischemic changes -  restart Eliquis 2.5 mg daily -s/p TTE-report pending -Monitor for s/sx of bleeding and transfuse for hgb <7 or active  signs of bleeding - Continue Keppra  Accelerated Hypertension  Resolving  Continuous telemetry monitoring  Continue statin Hydralazine and labetalol PRN  ESRD on HD  Trend BMP  Replace electrolytes as indicated  Avoid nephrotoxic medications  Nephrology consulted appreciate input-HD per recommendations  Left BKA stump infection  pseudomonas aeruginosa-completed course of antibiotics per ID recommendations) Vascular surgery consulted appreciate input- continue VAC, no surgical intervention Wound care  Diabetes - ICU hypoglycemic\Hyperglycemia protocol -check FSBS per protocol  Hyperlipidemia aspirin, Eliquis and Lipitor for medical management as an outpatient. I do not see aspirin started inpatient  ELECTROLYTES -follow labs as needed -replace as needed -pharmacy consultation - continue Renvela, calcitriol   GERD Pepcid   DVT prophylaxis patient already on Eliquis   All the records are reviewed and case is discussed with Care Management/Social Worker. Management plans discussed with the patient and/or family and they are in agreement.  CODE STATUS: Partial Code  TOTAL TIME TAKING CARE OF THIS PATIENT: 20 minutes.   More than 50% of the time was spent in counseling/coordination of care: YES  POSSIBLE D/C IN 1-2 DAYS, DEPENDING ON CLINICAL CONDITION.   Aron Inge PA-C on 07/09/2018 at 5:39 PM  Between 7am to 6pm - Pager - [phone]  After 6 pm go to [email or web address] - Technical brewer [facility]  Office  [phone]  CC: Primary care physician; Kirk Ruths, MD  Note: This dictation was prepared with Dragon dictation along with smaller phrase technology. Any transcriptional errors that result from this process are unintentional.

## 2018-07-10 LAB — GLUCOSE, CAPILLARY
Glucose-Capillary: 156 mg/dL — ABNORMAL HIGH (ref 70–99)
Glucose-Capillary: 19 mg/dL — CL (ref 70–99)
Glucose-Capillary: 25 mg/dL — CL (ref 70–99)
Glucose-Capillary: 31 mg/dL — CL (ref 70–99)
Glucose-Capillary: 171 mg/dL — ABNORMAL HIGH (ref 70–99)
Glucose-Capillary: 257 mg/dL — ABNORMAL HIGH (ref 70–99)
Glucose-Capillary: 176 mg/dL — ABNORMAL HIGH (ref 70–99)
Glucose-Capillary: 267 mg/dL — ABNORMAL HIGH (ref 70–99)

## 2018-07-10 LAB — CBC
HCT: 43.7 % (ref 36.0–46.0)
Hemoglobin: 13.5 g/dL (ref 12.0–15.0)
MCH: 28.7 pg (ref 26.0–34.0)
MCHC: 30.9 g/dL (ref 30.0–36.0)
MCV: 93 fL (ref 80.0–100.0)
Platelets: 122 10*3/uL — ABNORMAL LOW (ref 150–400)
RBC: 4.7 MIL/uL (ref 3.87–5.11)
RDW: 21.5 % — ABNORMAL HIGH (ref 11.5–15.5)
WBC: 9.5 10*3/uL (ref 4.0–10.5)
nRBC: 0 % (ref 0.0–0.2)

## 2018-07-10 LAB — BASIC METABOLIC PANEL
Anion gap: 16 — ABNORMAL HIGH (ref 5–15)
BUN: 80 mg/dL — ABNORMAL HIGH (ref 8–23)
CO2: 18 mmol/L — ABNORMAL LOW (ref 22–32)
Calcium: 8.6 mg/dL — ABNORMAL LOW (ref 8.9–10.3)
Chloride: 103 mmol/L (ref 98–111)
Creatinine, Ser: 8.46 mg/dL — ABNORMAL HIGH (ref 0.44–1.00)
GFR calc Af Amer: 5 mL/min — ABNORMAL LOW (ref >60)
GFR calc non Af Amer: 4 mL/min — ABNORMAL LOW (ref >60)
Glucose, Bld: 183 mg/dL — ABNORMAL HIGH (ref 70–99)
Potassium: 6.3 mmol/L (ref 3.5–5.1)
Sodium: 137 mmol/L (ref 135–145)

## 2018-07-10 LAB — PHOSPHORUS: Phosphorus: 9.8 mg/dL — ABNORMAL HIGH (ref 2.5–4.6)

## 2018-07-10 LAB — TROPONIN I: Troponin I: 0.09 ng/mL (ref <0.03)

## 2018-07-10 MED ORDER — DEXTROSE 50 % IV SOLN
INTRAVENOUS | Status: AC
  Administered 2018-07-10: 50 mL via INTRAVENOUS
  Filled 2018-07-10: qty 50

## 2018-07-10 MED ORDER — DEXTROSE 50 % IV SOLN
50.0000 mL | Freq: Once | INTRAVENOUS | Status: AC
  Administered 2018-07-10: 50 mL via INTRAVENOUS
  Filled 2018-07-10: qty 50

## 2018-07-10 MED ORDER — NEPRO/CARBSTEADY PO LIQD
237.0000 mL | Freq: Two times a day (BID) | ORAL | Status: DC
  Administered 2018-07-10 – 2018-07-12 (×5): 237 mL via ORAL

## 2018-07-10 MED ORDER — CARVEDILOL 6.25 MG PO TABS
6.2500 mg | ORAL_TABLET | Freq: Two times a day (BID) | ORAL | Status: DC
  Administered 2018-07-10 – 2018-07-11 (×2): 6.25 mg via ORAL
  Filled 2018-07-10: qty 2
  Filled 2018-07-10 (×3): qty 1

## 2018-07-10 MED ORDER — RENA-VITE PO TABS
1.0000 | ORAL_TABLET | Freq: Every day | ORAL | Status: DC
  Administered 2018-07-10 – 2018-07-12 (×3): 1 via ORAL
  Filled 2018-07-10 (×4): qty 1

## 2018-07-10 MED ORDER — LEVETIRACETAM 250 MG PO TABS
250.0000 mg | ORAL_TABLET | Freq: Once | ORAL | Status: AC
  Administered 2018-07-10: 250 mg via ORAL
  Filled 2018-07-10: qty 1

## 2018-07-10 MED ORDER — APIXABAN 2.5 MG PO TABS
2.5000 mg | ORAL_TABLET | Freq: Two times a day (BID) | ORAL | Status: DC
  Administered 2018-07-10 – 2018-07-13 (×6): 2.5 mg via ORAL
  Filled 2018-07-10 (×6): qty 1

## 2018-07-10 MED ORDER — INSULIN ASPART 100 UNIT/ML ~~LOC~~ SOLN
0.0000 [IU] | Freq: Three times a day (TID) | SUBCUTANEOUS | Status: DC
  Administered 2018-07-10: 1 [IU] via SUBCUTANEOUS
  Administered 2018-07-11: 12:00:00 2 [IU] via SUBCUTANEOUS
  Administered 2018-07-11 – 2018-07-12 (×2): 1 [IU] via SUBCUTANEOUS
  Administered 2018-07-12 (×2): 2 [IU] via SUBCUTANEOUS
  Administered 2018-07-13 (×2): 1 [IU] via SUBCUTANEOUS
  Filled 2018-07-10 (×8): qty 1

## 2018-07-10 MED ORDER — ALTEPLASE 2 MG IJ SOLR
2.0000 mg | Freq: Once | INTRAMUSCULAR | Status: AC
  Administered 2018-07-10: 2 mg
  Filled 2018-07-10: qty 2

## 2018-07-10 MED ORDER — DEXTROSE 10 % IV SOLN
INTRAVENOUS | Status: DC
  Administered 2018-07-10: 06:00:00 via INTRAVENOUS

## 2018-07-10 MED ORDER — ASPIRIN EC 81 MG PO TBEC
81.0000 mg | DELAYED_RELEASE_TABLET | Freq: Every day | ORAL | Status: DC
  Administered 2018-07-10 – 2018-07-13 (×4): 81 mg via ORAL
  Filled 2018-07-10 (×4): qty 1

## 2018-07-10 MED ORDER — ISOSORBIDE MONONITRATE ER 30 MG PO TB24
30.0000 mg | ORAL_TABLET | Freq: Every day | ORAL | Status: DC
  Administered 2018-07-10 – 2018-07-13 (×4): 30 mg via ORAL
  Filled 2018-07-10 (×4): qty 1

## 2018-07-10 NOTE — Progress Notes (Signed)
CRITICAL VALUE ALERT  Critical Value:  Potassium 6.3  Date & Time Notied:  07/10/18 0930  Provider Notified: Dr Manuella Ghazi  Orders Received/Actions taken: No new orders at this time. Pt scheduled for HD later today.

## 2018-07-10 NOTE — Progress Notes (Signed)
Pre HD Tx   07/10/18 1230  Vital Signs  Temp 98 F (36.7 C)  Temp Source Oral  Pulse Rate 70  Pulse Rate Source Monitor  Resp 18  BP (!) 168/82  BP Location Right Arm  BP Method Automatic  Patient Position (if appropriate) Lying  Oxygen Therapy  SpO2 96 %  O2 Device Room Air  Pain Assessment  Pain Scale 0-10  Pain Score 0  Dialysis Weight  Weight 48.2 kg  Type of Weight Pre-Dialysis  Time-Out for Hemodialysis  What Procedure? hemodialysis  Pt Identifiers(min of two) First/Last Name;MRN/Account#  Correct Site? Yes  Correct Side? Yes  Correct Procedure? Yes  Consents Verified? Yes  Rad Studies Available? N/A  Safety Precautions Reviewed? Yes  Engineer, civil (consulting) Number 2  Station Number 1  UF/Alarm Test Passed  Conductivity: Meter 14  Conductivity: Machine  14  pH 7.4  Reverse Osmosis Main  Normal Saline Lot Number [id number]  Dialyzer Lot Number [id number]  Disposable Set Lot Number [id number]  Machine Temperature 98.6 F (37 C)  Musician and Audible Yes  Blood Lines Intact and Secured Yes  Pre Treatment Patient Checks  Vascular access used during treatment Catheter  Hepatitis B Surface Antigen Results Negative  Date Hepatitis B Surface Antigen Drawn 11/28/17  Isolation Initiated Yes  Hepatitis B Surface Antibody  (>10)  Date Hepatitis B Surface Antibody Drawn 07/10/18  Hemodialysis Consent Verified Yes  Hemodialysis Standing Orders Initiated Yes  ECG (Telemetry) Monitor On Yes  Prime Ordered Normal Saline  Length of  DialysisTreatment -hour(s) 3.5 Hour(s)  Dialysis Treatment Comments Na 140  Dialyzer Elisio 17H NR  Dialysate 2K, 2.5 Ca  Dialysate Flow Ordered 800  Blood Flow Rate Ordered 400 mL/min  Ultrafiltration Goal 1.5 Liters  Pre Treatment Labs Phosphorus  Dialysis Blood Pressure Support Ordered Normal Saline  Education / Care Plan  Dialysis Education Provided Yes  Documented Education in Care Plan Yes  Hemodialysis Catheter Left  Subclavian Double-lumen  Placement Date: 02/11/18   Placed prior to admission: No  Orientation: Left  Access Location: Subclavian  Hemodialysis Catheter Type: Double-lumen  Site Condition No complications  Blue Lumen Status Capped (Central line)  Red Lumen Status Capped (Central line)  Dressing Type Gauze/Drain sponge  Dressing Status Clean;Dry;Intact  Drainage Description None

## 2018-07-10 NOTE — Progress Notes (Signed)
PT Cancellation Note  Patient Details Name: Stephanie Ellis MRN: [id number] DOB: 21-Jan-1951   Cancelled Treatment:    Reason Eval/Treat Not Completed: Other (comment)(Patient sleeping, unable to rouse. ) Patient sleeping upon PT entering room. PT took BP after talking with nursing. Unable to rouse patient for longer than 4 seconds when taking BP, Patient moved during reading, hiding head and burying into blankets potentially affecting reading: results was BP: 99/83. Patient refused PT eval. Will attempt again at a later time.   Janna Arch, PT, DPT   07/10/2018, 11:27 AM

## 2018-07-10 NOTE — Plan of Care (Signed)
Pt a&ox4. S/P HD today. When pt arrived to the floor from dialysis SBP in the 190's. Labetalol given with improvement.  CBG's stable.

## 2018-07-10 NOTE — Progress Notes (Signed)
HD Tx Start   07/10/18 1240  Vital Signs  Pulse Rate 70  Resp 18  BP (!) 154/78  Oxygen Therapy  SpO2 100 %  During Hemodialysis Assessment  Blood Flow Rate (mL/min) 250 mL/min  Arterial Pressure (mmHg) -260 mmHg  Venous Pressure (mmHg) 180 mmHg  Transmembrane Pressure (mmHg) 40 mmHg  Ultrafiltration Rate (mL/min) 570 mL/min (529m/HOUR removal)  Dialysate Flow Rate (mL/min) 800 ml/min  Conductivity: Machine  14  HD Safety Checks Performed Yes  Dialysis Fluid Bolus Normal Saline  Bolus Amount (mL) 250 mL  Intra-Hemodialysis Comments Tx initiated  Hemodialysis Catheter Left Subclavian Double-lumen  Placement Date: 02/11/18   Placed prior to admission: No  Orientation: Left  Access Location: Subclavian  Hemodialysis Catheter Type: Double-lumen  Site Condition No complications  Blue Lumen Status Infusing  Red Lumen Status Infusing  Dressing Type Gauze/Drain sponge  Dressing Status Clean;Dry;Intact  Drainage Description None

## 2018-07-10 NOTE — Progress Notes (Signed)
Pre HD Assessment   07/10/18 1230  Neurological  Level of Consciousness Alert  Orientation Level Oriented X4  Respiratory  Respiratory Pattern Regular  Chest Assessment Chest expansion symmetrical  Bilateral Breath Sounds Diminished  Cough None  Cardiac  Pulse Regular  ECG Monitor Yes  Cardiac Rhythm NSR  Antiarrhythmic device No  Vascular  R Radial Pulse +2  L Radial Pulse +2  Integumentary  Integumentary (WDL) X  Skin Color Appropriate for ethnicity  Skin Condition Dry  Skin Integrity Surgical Incision (see LDA)  Musculoskeletal  Musculoskeletal (WDL) X  Generalized Weakness Yes  GU Assessment  Genitourinary Symptoms Anuria (HD pt)  Psychosocial  Psychosocial (WDL) WDL  Incision (Closed) 05/08/18 Knee Left  Date First Assessed/Time First Assessed: 05/08/18 1404   Location: Knee  Location Orientation: Left  Dressing Type  (xeroform, kerlix and paper tape)

## 2018-07-10 NOTE — Progress Notes (Signed)
[facility] NAME: Stephanie Ellis    MR#:  [id number]  DATE OF BIRTH:  1950-10-03  SUBJECTIVE:  CHIEF COMPLAINT:   Chief Complaint  Patient presents with  . Altered Mental Status   Awaiting dialysis when seen. She is typically T-Th-Sat however missed some sessions. Feels much better and closer to her baseline. Leg pain "better".  REVIEW OF SYSTEMS:  Review of Systems  Constitutional: Negative for chills, fever and malaise/fatigue.  HENT: Negative for congestion, sinus pain and sore throat.   Eyes: Negative for blurred vision, double vision and pain.       Positive vision loss left eye from "old infection"   Respiratory: Negative for cough, shortness of breath and wheezing.   Cardiovascular: Negative for chest pain, palpitations and leg swelling.  Gastrointestinal: Negative for abdominal pain, nausea and vomiting.  Genitourinary: Negative for dysuria and flank pain.  Musculoskeletal: Positive for joint pain (bilateral BKAs) Skin: Negative for itching and rash.  Neurological: Negative for dizziness, focal weakness, weakness and headaches.  Psychiatric/Behavioral: Negative for depression and hallucinations. The patient is not nervous/anxious.   DRUG ALLERGIES:   Allergies  Allergen Reactions  . Chlorthalidone Anaphylaxis, Itching and Rash  . Fentanyl Rash  . Midazolam Rash  . Ace Inhibitors Other (See Comments)    Reaction:  Hyperkalemia, agitation   . Angiotensin Receptor Blockers Other (See Comments)    Reaction:  Hyperkalemia, agitation   . Norvasc [Amlodipine] Itching and Rash  . Phenergan [Promethazine Hcl] Anxiety    "antsy, can't sit still"   VITALS:  Blood pressure (!) 146/59, pulse 70, temperature 98 F (36.7 C), temperature source Oral, resp. rate 20, height 5' 4"  (1.626 m), weight 48.2 kg, SpO2 100 %. PHYSICAL EXAMINATION:  GENERAL:68 y.o.-year-old patient lying in the bed with no acute distress.   EYES:Left pupil irregular and dilated, right pupil reactive to light HEENT: Head atraumatic, normocephalic. Oropharynx clear. NECK: Supple, no jugular venous distention. No thyroid enlargement, no tenderness.  LUNGS: Normal breath sounds bilaterally, no wheezing, rales,rhonchi or crepitation. No use of accessory muscles of respiration.  CARDIOVASCULAR: S1, S2 ZWCHEN.2/7 systolicmurmur. norubs, or gallops.  ABDOMEN: Soft, nontender, nondistended. Bowel sounds present.  EXTREMITIES: No pedal edema, cyanosis, or clubbing.Left lower extremity today wrapped in bandage. Bilateral lower extremity BKAs NEUROLOGIC: Finger to nose testing normal. grip strength equal bilaterally. Facial movements symmetrical. No slurred speech.Moving extremities in bed without difficulty.Sensation intact. Gait not checked. PSYCHIATRIC: The patient is alert and oriented x 3 today. SKIN:Large left lower extremity stump ulceration with some greenish discoloration. LABORATORY PANEL:  Female CBC Recent Labs  Lab 07/10/18 0840  WBC 9.5  HGB 13.5  HCT 43.7  PLT 122*   ------------------------------------------------------------------------------------------------------------------ Chemistries  Recent Labs  Lab 07/08/18 1357 07/09/18 0409 07/10/18 0840  NA 137 138 137  K 4.4 4.8 6.3*  CL 97* 99 103  CO2 23 23 18*  GLUCOSE 126* 83 183*  BUN 43* 61* 80*  CREATININE 6.00* 7.17* 8.46*  CALCIUM 8.0* 8.1* 8.6*  MG  --  2.2  --   AST 22  --   --   ALT 17  --   --   ALKPHOS 85  --   --   BILITOT 0.4  --   --    RADIOLOGY:  No results found. ASSESSMENT AND PLAN:   68 year old patient with past medical history of diabetes, sinusitis, CAD s/p CABG x 3, PVD, s/p bilateral BKAs,  atrial fibrillation, hyperlipidemia, GERD, HTN, ESRD on hemodialysis, CHF who presented to the ED with altered mental status following dialysis. Was initially unresponsive on arrival. Started on tPA in the ED. Infusion was started at  1401 however prior to completion of the infusion, it was found that patient was on anticoagulation Eliquis for Afib. Was in the ICU now on med/surg.  Acute metabolic encephalopathy, resolving Seizure disorder Improved and back to baseline Patient has a documented history of known seizure disorder, refused EEG yesterday for unknown reasons. Reconsider EEG if patient allows. Discontinued tramadol for seizure risk per NP [facility]  Neurology consulted appreciate input-s/p tPA for CVA-neuromonitoring in progress, frequent neuro checks  -brain imaging reassuring with chronic stable findings - US carotid: Extensive echogenic calcified plaque throughout the bilateral carotid arteries without significant stenosis. Estimated degree of stenosis in the internal carotid arteries is less than 50% bilaterally. Patent vertebral arteries with antegrade flow. - repeat CT head - no hemorrhage -  MRI no acute findings, chronic small vessel ischemic changes -  restart Eliquis 2.5 mg daily, restarted aspirin - s/p TTE: LVEF 35-40%. Aortic valve abnormalities, possibly bicuspid.  -Monitor for s/sx of bleeding and transfuse for hgb <7 or active signs of bleeding - Continue Keppra, has scheduled + another dose to be given only after dialysis Continuous telemetry monitoring  Continue statin  Afib Eliquis, carvedilol.  Accelerated Hypertension Resolving  Hydralazine and labetalol PRN Also re-ordered her home Imdur and carvedilol today. Also has home valsartan, attempted to order equivalent ARB today, system flagged for allergy. Hold off for now.  ESRD on HD  Trend BMP  Avoid nephrotoxic medications  Nephrology consulted appreciate input-HD per recommendations. Dialysis today.  - pharmacy consultation for electrolytes - continue Renvela, calcitriol, re-ordered her renal vitamins today - nutrition following   - hyperkalemia today, awaiting dialysis, monitor as above  Left BKA stump infection pseudomonas  aeruginosa-completed course of antibiotics per ID recommendations) Vascular surgery consulted appreciate input- continue VAC, no surgical intervention  Wound care  CHF Echo 07/10/18 shows LVEF 35-40%. Patient is on a beta-blocker and allegedly valsartan at home. Attempted to order equivalent ARB today, system flagged for allergy. Hold off for now. Monitor for signs of fluid overload.  Diabetes - ICU hypoglycemic\Hyperglycemia protocol - check FSBS per protocol - discontinued HS correction for some hypoglycemia today for which she required dextrose IV. Sugars normalized. Changed mealtime correction to very-sensitive per DM coordinator.   Hyperlipidemia Lipitor  GERD Pepcid   DVT prophylaxis patient already on Eliquis  All the records are reviewed and case is discussed with Care Management/Social Worker. Management plans discussed with the patient and/or family and they are in agreement.  CODE STATUS: Partial Code  TOTAL TIME TAKING CARE OF THIS PATIENT: 30 minutes.   More than 50% of the time was spent in counseling/coordination of care: YES  POSSIBLE D/C IN 2-3 DAYS, DEPENDING ON CLINICAL CONDITION.   Ripley Fraise PA-C on 07/10/2018 at 2:34 PM  Between 7am to 6pm - Pager - [phone]  After 6 pm go to [email or web address] - Technical brewer [facility]  Office  [phone]  CC: Primary care physician; Kirk Ruths, MD  Note: This dictation was prepared with Dragon dictation along with smaller phrase technology. Any transcriptional errors that result from this process are unintentional.

## 2018-07-10 NOTE — Plan of Care (Signed)
  Problem: Education: Goal: Knowledge of General Education information will improve Description Including pain rating scale, medication(s)/side effects and non-pharmacologic comfort measures Outcome: Progressing   Problem: Clinical Measurements: Goal: Ability to maintain clinical measurements within normal limits will improve Outcome: Progressing Goal: Respiratory complications will improve Outcome: Progressing Goal: Cardiovascular complication will be avoided Outcome: Progressing   Problem: Activity: Goal: Risk for activity intolerance will decrease Outcome: Progressing   Problem: Nutrition: Goal: Adequate nutrition will be maintained Outcome: Progressing   Problem: Coping: Goal: Level of anxiety will decrease Outcome: Progressing   Problem: Elimination: Goal: Will not experience complications related to bowel motility Outcome: Progressing   Problem: Pain Managment: Goal: General experience of comfort will improve Outcome: Progressing   Problem: Safety: Goal: Ability to remain free from injury will improve Outcome: Progressing

## 2018-07-10 NOTE — Progress Notes (Signed)
Post HD Assessment  Pt signed off with 1 hour remaining (AMA), pt educated on risks considering elevated potassium. Final 1.5 hours of tx, HD perm cath functioned well at 400BFR after Activase dwell of 45 mins.  Total of 1.5 liters of fluid removed today.    07/10/18 1700  Neurological  Level of Consciousness Alert  Orientation Level Oriented X4  Respiratory  Respiratory Pattern Regular  Chest Assessment Chest expansion symmetrical  Bilateral Breath Sounds Diminished  Cough None  Cardiac  Pulse Regular  ECG Monitor Yes  Cardiac Rhythm NSR  Antiarrhythmic device No  Vascular  R Radial Pulse +2  L Radial Pulse +2  Integumentary  Integumentary (WDL) X  Skin Color Appropriate for ethnicity  Skin Condition Dry  Skin Integrity Surgical Incision (see LDA)  Musculoskeletal  Musculoskeletal (WDL) X  Generalized Weakness Yes  Gastrointestinal  Last BM Date 07/10/18  Additional Gastrointestinal Comments  (Bowel Movement during HD tx in bed - pt cleaned/gown change)  GU Assessment  Genitourinary Symptoms Anuria (HD pt)  Psychosocial  Psychosocial (WDL) WDL  Incision (Closed) 05/08/18 Knee Left  Date First Assessed/Time First Assessed: 05/08/18 1404   Location: Knee  Location Orientation: Left  Dressing Type  (xeroform, kerlix and paper tape)

## 2018-07-10 NOTE — Progress Notes (Signed)
Inpatient Diabetes Program Recommendations  AACE/ADA: New Consensus Statement on Inpatient Glycemic Control (2015)  Target Ranges:  Prepandial:   less than 140 mg/dL      Peak postprandial:   less than 180 mg/dL (1-2 hours)      Critically ill patients:  140 - 180 mg/dL   Lab Results  Component Value Date   GLUCAP 257 (H) 07/10/2018   HGBA1C 7.1 (H) 07/08/2018    Review of Glycemic Control Results for Stephanie Ellis, Stephanie Ellis (MRN [id number]) as of 07/10/2018 12:01  Ref. Range 07/10/2018 04:52 07/10/2018 05:11 07/10/2018 05:27 07/10/2018 07:55 07/10/2018 11:36  Glucose-Capillary Latest Ref Range: 70 - 99 mg/dL 19 (LL) 31 (LL) 156 (H) 171 (H) 257 (H)   Diabetes history: DM 2 Outpatient Diabetes medications: No home medications Current orders for Inpatient glycemic control:  Novolog sensitive tid with meals and HS  Inpatient Diabetes Program Recommendations:    Blood sugar dropped to 19 mg/dL this morning.  Patient received D50 per hypoglycemia order set.  She did receive Novolog correction 3 units at 2155 last night for blood sugar of 285 mg/dL.   Please consider d/c of HS Novolog scale.  Also consider reducing Novolog correction to custom scale starting at 151 mg/dL-200 mg/dL- 1 unit, 201-250 mg/dL- 2 units, 251-300 mg/dL- 3 units, 301-350 mg/dL-4 units, 351-400 mg/dL- 5 units.    Discussed with MD and orders received.   Thanks,  Adah Perl, RN, BC-ADM Inpatient Diabetes Coordinator Pager [phone] (8a-5p)

## 2018-07-10 NOTE — Progress Notes (Signed)
[facility]  ROUNDING NOTE   Subjective:  Patient seen at bedside. Due for hemodialysis today.   Objective:  Vital signs in last 24 hours:  Temp:  [97.4 F (36.3 C)-98.6 F (37 C)] 97.4 F (36.3 C) (03/20 0849) Pulse Rate:  [62-72] 69 (03/20 0849) Resp:  [11-20] 18 (03/20 0849) BP: (117-200)/(65-97) 176/65 (03/20 0946) SpO2:  [93 %-98 %] 96 % (03/20 0849) Weight:  [48.2 kg] 48.2 kg (03/19 1828)  Weight change: -3.6 kg Filed Weights   07/08/18 1351 07/09/18 1828  Weight: 51.8 kg 48.2 kg    Intake/Output: I/O last 3 completed shifts: In: 650.5 [P.O.:480; I.V.:20.5; IV Piggyback:150] Out: -    Intake/Output this shift:  No intake/output data recorded.  Physical Exam: General: No acute distress  Head: Normocephalic, atraumatic. Moist oral mucosal membranes  Eyes: Anicteric  Neck: Supple, trachea midline  Lungs:  Clear to auscultation, normal effort  Heart: S1S2 no rubs  Abdomen:  Soft, nontender, bowel sounds present  Extremities: B/L BKA  Neurologic: Awake, alert, following commands, oriented to self  Skin: No lesions  Access: IJ permcath    Basic Metabolic Panel: Recent Labs  Lab 07/08/18 1357 07/09/18 0409 07/10/18 0840  NA 137 138 137  K 4.4 4.8 6.3*  CL 97* 99 103  CO2 23 23 18*  GLUCOSE 126* 83 183*  BUN 43* 61* 80*  CREATININE 6.00* 7.17* 8.46*  CALCIUM 8.0* 8.1* 8.6*  MG  --  2.2  --     Liver Function Tests: Recent Labs  Lab 07/08/18 1357  AST 22  ALT 17  ALKPHOS 85  BILITOT 0.4  PROT 7.2  ALBUMIN 3.5   No results for input(s): LIPASE, AMYLASE in the last 168 hours. Recent Labs  Lab 07/09/18 0409  AMMONIA <9*    CBC: Recent Labs  Lab 07/08/18 1357 07/09/18 0409 07/10/18 0840  WBC 8.4 7.6 9.5  NEUTROABS 6.5  --   --   HGB 14.1 13.1 13.5  HCT 47.0* 43.7 43.7  MCV 95.1 95.6 93.0  PLT 127* 128* 122*    Cardiac Enzymes: Recent Labs  Lab 07/08/18 1357 07/08/18 2012 07/09/18 0409 07/10/18 0840   TROPONINI 0.09* 0.11* 0.15* 0.09*    BNP: Invalid input(s): POCBNP  CBG: Recent Labs  Lab 07/10/18 0452 07/10/18 0511 07/10/18 0527 07/10/18 0755 07/10/18 1136  GLUCAP 19* 31* 156* 171* 64*    Microbiology: Results for orders placed or performed during the hospital encounter of 07/08/18  MRSA PCR Screening     Status: Abnormal   Collection Time: 07/08/18  3:42 PM  Result Value Ref Range Status   MRSA by PCR POSITIVE (A) NEGATIVE Final    Comment:        The GeneXpert MRSA Assay (FDA approved for NASAL specimens only), is one component of a comprehensive MRSA colonization surveillance program. It is not intended to diagnose MRSA infection nor to guide or monitor treatment for MRSA infections. RESULT CALLED TO, READ BACK BY AND VERIFIED WITH: DILLON WHITE 07/08/18 @ [place] Performed at [facility], [address] [place], [place] [address]     Coagulation Studies: Recent Labs    07/08/18 1357  LABPROT 15.3*  INR 1.2    Urinalysis: No results for input(s): COLORURINE, LABSPEC, PHURINE, GLUCOSEU, HGBUR, BILIRUBINUR, KETONESUR, PROTEINUR, UROBILINOGEN, NITRITE, LEUKOCYTESUR in the last 72 hours.  Invalid input(s): APPERANCEUR    Imaging: Ct Head Wo Contrast  Result Date: 07/09/2018 CLINICAL DATA:  Altered mental status.  Speech disturbance. Patient status post administration of TPA 07/08/2018. EXAM: CT HEAD WITHOUT CONTRAST TECHNIQUE: Contiguous axial images were obtained from the base of the skull through the vertex without intravenous contrast. COMPARISON:  Head CT scan 0 3/had CT scan and brain MRI 07/08/2018 FINDINGS: Brain: No evidence of acute infarction, hemorrhage, hydrocephalus, extra-axial collection or mass lesion/mass effect. Chronic microvascular ischemic change noted. Vascular: Atherosclerosis is seen. Skull: Intact.  No focal lesion. Sinuses/Orbits: Complete opacification left sphenoid sinus is unchanged. Other: None. IMPRESSION: No  acute abnormality. Atrophy and chronic microvascular ischemic change. Atherosclerosis. Chronic left sphenoid sinus disease. Electronically Signed   By: Inge Rise M.D.   On: 07/09/2018 14:17   Mr Jodene Nam Head Wo Contrast  Result Date: 07/08/2018 CLINICAL DATA:  Altered mental status. Speech disturbance. Dialysis patient. EXAM: MRI HEAD WITHOUT CONTRAST MRA HEAD WITHOUT CONTRAST TECHNIQUE: Multiplanar, multiecho pulse sequences of the brain and surrounding structures were obtained without intravenous contrast. Angiographic images of the head were obtained using MRA technique without contrast. COMPARISON:  Head CT same day.  MRI 03/14/2018. FINDINGS: MRI HEAD FINDINGS Brain: The study suffers from some motion degradation. Diffusion imaging does not show any acute or subacute infarction. The brainstem is normal. Few old small vessel cerebellar infarctions. Cerebral hemispheres show age related atrophy with mild to moderate chronic small-vessel ischemic changes of the white matter. Old small vessel thalamic infarction on the left. No cortical or large vessel territory infarction. No mass lesion, hemorrhage, hydrocephalus or extra-axial collection. Vascular: Major vessels at the base of the brain show flow. Skull and upper cervical spine: Negative Sinuses/Orbits: Sphenoid sinusitis. Other sinuses are clear. Orbits negative. Other: None MRA HEAD FINDINGS Severe motion degradation. Both internal carotid arteries show flow. There is flow in the anterior and middle cerebral vessels. Both vertebral arteries are patent to the basilar. The basilar artery and major posterior circulation branch vessels show flow. Detail beyond that is not reliable. IMPRESSION: No acute brain finding. Chronic small-vessel ischemic changes of the cerebellum, left thalamus and cerebral hemispheric white matter. MR angiogram is markedly degraded by motion. There does not appear to be any large vessel occlusion. Beyond that, detail is not  reliable because of motion. Electronically Signed   By: Nelson Chimes M.D.   On: 07/08/2018 17:08   Mr Brain Wo Contrast  Result Date: 07/08/2018 CLINICAL DATA:  Altered mental status. Speech disturbance. Dialysis patient. EXAM: MRI HEAD WITHOUT CONTRAST MRA HEAD WITHOUT CONTRAST TECHNIQUE: Multiplanar, multiecho pulse sequences of the brain and surrounding structures were obtained without intravenous contrast. Angiographic images of the head were obtained using MRA technique without contrast. COMPARISON:  Head CT same day.  MRI 03/14/2018. FINDINGS: MRI HEAD FINDINGS Brain: The study suffers from some motion degradation. Diffusion imaging does not show any acute or subacute infarction. The brainstem is normal. Few old small vessel cerebellar infarctions. Cerebral hemispheres show age related atrophy with mild to moderate chronic small-vessel ischemic changes of the white matter. Old small vessel thalamic infarction on the left. No cortical or large vessel territory infarction. No mass lesion, hemorrhage, hydrocephalus or extra-axial collection. Vascular: Major vessels at the base of the brain show flow. Skull and upper cervical spine: Negative Sinuses/Orbits: Sphenoid sinusitis. Other sinuses are clear. Orbits negative. Other: None MRA HEAD FINDINGS Severe motion degradation. Both internal carotid arteries show flow. There is flow in the anterior and middle cerebral vessels. Both vertebral arteries are patent to the basilar. The basilar artery and major posterior circulation branch vessels show flow. Detail  beyond that is not reliable. IMPRESSION: No acute brain finding. Chronic small-vessel ischemic changes of the cerebellum, left thalamus and cerebral hemispheric white matter. MR angiogram is markedly degraded by motion. There does not appear to be any large vessel occlusion. Beyond that, detail is not reliable because of motion. Electronically Signed   By: Nelson Chimes M.D.   On: 07/08/2018 17:08   US  Carotid Bilateral  Result Date: 07/09/2018 CLINICAL DATA:  CVA.  Altered mental status. EXAM: BILATERAL CAROTID DUPLEX ULTRASOUND TECHNIQUE: Pearline Cables scale imaging, color Doppler and duplex ultrasound were performed of bilateral carotid and vertebral arteries in the neck. COMPARISON:  03/14/2018 FINDINGS: Criteria: Quantification of carotid stenosis is based on velocity parameters that correlate the residual internal carotid diameter with NASCET-based stenosis levels, using the diameter of the distal internal carotid lumen as the denominator for stenosis measurement. The following velocity measurements were obtained: RIGHT ICA: 65 cm/sec CCA: 93 cm/sec SYSTOLIC ICA/CCA RATIO:  0.6 ECA: 61 cm/sec LEFT ICA: 85 cm/sec CCA: 71 cm/sec SYSTOLIC ICA/CCA RATIO:  1.2 ECA: 62 cm/sec RIGHT CAROTID ARTERY: Again noted is extensive echogenic plaque in the right common carotid artery. No significant stenosis based on the velocity measurements and waveforms. Echogenic plaque at the right carotid bulb. External carotid artery is patent with normal waveform. Echogenic plaque in the internal carotid artery. Normal waveforms and velocities in the internal carotid artery. Portion of the distal internal carotid artery is not well visualized due to shadowing plaque. RIGHT VERTEBRAL ARTERY: Antegrade flow and normal waveform in the right vertebral artery. LEFT CAROTID ARTERY: Extensive echogenic plaque in the proximal left common carotid artery. Echogenic plaque at the left carotid bulb. External carotid artery is patent with normal waveform. Echogenic and shadowing plaque in the internal carotid artery. Normal waveforms and velocities in the internal carotid artery. LEFT VERTEBRAL ARTERY: Antegrade flow and normal waveform in the left vertebral artery. IMPRESSION: Extensive echogenic calcified plaque throughout the bilateral carotid arteries without significant stenosis. Estimated degree of stenosis in the internal carotid arteries is less  than 50% bilaterally. Patent vertebral arteries with antegrade flow. Electronically Signed   By: Markus Daft M.D.   On: 07/09/2018 10:53   Dg Chest Portable 1 View  Result Date: 07/08/2018 CLINICAL DATA:  Altered mental status. EXAM: PORTABLE CHEST 1 VIEW COMPARISON:  04/23/2018. FINDINGS: Stable enlarged cardiac silhouette. Interval mild patchy opacities in both lungs, right greater than left. Stable post CABG changes. Stable left jugular double-lumen catheter with its tip in the mid right atrium. Unremarkable bones. IMPRESSION: 1. Interval mild bilateral alveolar edema or pneumonia, right greater than left. 2. Stable cardiomegaly. Electronically Signed   By: Claudie Revering M.D.   On: 07/08/2018 17:41   Ct Head Code Stroke Wo Contrast  Addendum Date: 07/08/2018   ADDENDUM REPORT: 07/08/2018 14:15 ADDENDUM: Study discussed by telephone with Dr. Eula Listen on 07/08/2018 at 1357 hours. Electronically Signed   By: Genevie Ann M.D.   On: 07/08/2018 14:15   Result Date: 07/08/2018 CLINICAL DATA:  Code stroke. 68 year old female with left facial droop and syncope. Last known well 1250 hours. EXAM: CT HEAD WITHOUT CONTRAST TECHNIQUE: Contiguous axial images were obtained from the base of the skull through the vertex without intravenous contrast. COMPARISON:  Head CT 03/17/2018. Brain MRI 03/14/2018 and earlier. FINDINGS: Brain: No midline shift, mass effect, or evidence of intracranial mass lesion. No ventriculomegaly. No acute intracranial hemorrhage identified. New heterogeneity of the left thalamus on series 2, image 14, but this would  result in right side symptoms. Gray-white matter differentiation otherwise appears stable. No cortically based acute infarct identified. Vascular: Extensive Calcified atherosclerosis at the skull base. No suspicious intracranial vascular hyperdensity. Skull: Negative. Sinuses/Orbits: Chronic left sphenoid sinusitis, otherwise stable and well pneumatized. Other: No acute orbit  or scalp soft tissue findings. There is some calcified scalp vessel atherosclerosis. ASPECTS [facility] Stroke Program Early CT Score) - Ganglionic level infarction (caudate, lentiform nuclei, internal capsule, insula, M1-M3 cortex): 7 - Supraganglionic infarction (M4-M6 cortex): 3 Total score (0-10 with 10 being normal): 10 IMPRESSION: 1. 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 2. No acute cortically based infarct or acute intracranial hemorrhage identified. ASPECTS is 10. 3. Calcified atherosclerosis, including in scalp vessels which can be seen in the setting of in stage renal disease. Electronically Signed: By: Genevie Ann M.D. On: 07/08/2018 13:52     Medications:   . sodium chloride Stopped (07/09/18 2245)  . dextrose 50 mL/hr at 07/10/18 8341  . famotidine (PEPCID) IV Stopped (07/09/18 2245)   . atorvastatin  80 mg Oral QHS  . brimonidine  1 drop Both Eyes TID  . calcitRIOL  0.5 mcg Oral Daily  . Chlorhexidine Gluconate Cloth  6 each Topical Q0600  . feeding supplement (NEPRO CARB STEADY)  237 mL Oral BID BM  . insulin aspart  0-5 Units Subcutaneous TID WC  . levETIRAcetam  250 mg Oral Q T,Th,Sat-1800  . levETIRAcetam  500 mg Oral BID  . mirtazapine  7.5 mg Oral QHS  . multivitamin  1 tablet Oral QHS  . mupirocin ointment  1 application Nasal BID  . polyvinyl alcohol  1 drop Both Eyes BID  . sevelamer carbonate  800 mg Oral TID WC  . timolol  1 drop Both Eyes TID   sodium chloride, acetaminophen, hydrALAZINE, labetalol  Assessment/ Plan:  68 y.o. female with end stage renal disease on hemodialysis, hypertension, congestive heart failure, coronary artery disease status post CABG, diabetes mellitus type II insulin dependent, GERD, peripheral vascular disease status post right BKA, history of endocarditis and osteomyelitis  [facility] [place] hemodialysis left IJ permcath  1.  ESRD on HD TTHS:  Patient last had dialysis on Wednesday.  We will plan for dialysis again today and try to get her back on her schedule tomorrow.  2.  Altered mental status: no definitive CVA on MRI.  Could be hypertensive encephalopathy.    3.  Hypertension: Blood pressure currently 176/75.  She will be due for dialysis today.  Patient currently receiving as needed hydralazine and labetalol.  4.  Secondary hyperparathyroidism: Recheck serum phosphorus today.   LOS: 2 Jefry Lesinski 3/20/20201:13 PM

## 2018-07-10 NOTE — Progress Notes (Addendum)
Subjective: No new stroke or strokelike symptoms reported.  Patient now back to baseline with no new neurological deficit noted.  Objective: Current vital signs: BP (!) 176/65   Pulse 69   Temp (!) 97.4 F (36.3 C) (Oral)   Resp 18   Ht 5' 4"  (1.626 m)   Wt 48.2 kg   SpO2 96%   BMI 18.24 kg/m  Vital signs in last 24 hours: Temp:  [97.4 F (36.3 C)-98.6 F (37 C)] 97.4 F (36.3 C) (03/20 0849) Pulse Rate:  [62-72] 69 (03/20 0849) Resp:  [11-20] 18 (03/20 0849) BP: (117-200)/(65-97) 176/65 (03/20 0946) SpO2:  [93 %-98 %] 96 % (03/20 0849) Weight:  [48.2 kg] 48.2 kg (03/19 1828)  Intake/Output from previous day: 03/19 0701 - 03/20 0700 In: 580 [P.O.:480; IV Piggyback:100] Out: -  Intake/Output this shift: No intake/output data recorded. Nutritional status:  Diet Order            Diet renal with fluid restriction Fluid restriction: 1200 mL Fluid; Room service appropriate? Yes; Fluid consistency: Thin  Diet effective now              Neurological Exam  Mental Status: Alert, oriented x4 . Speech clear without evidence of aphasia. Able to follow commands without difficulty. Attention span and concentration seemed appropriate  Cranial Nerves: II: Discs flat bilaterally; visual field defect on the left. pupils equal, round, reactive to light and accommodation on the right III,IV, VI: ptosis not present V,VII: smile symmetric, facial light touch sensationintact VIII: hearing normal bilaterally IX,X: Deffered XI: Unable to assess XII: midline tongue extension Motor: Able to break gravity in both upper extremity Right:BKALeft: BKA Tone and bulk:normal tone throughout; no atrophy noted Sensory: Responds to noxious stimuli bilaterally Deep Tendon Reflexes: 2+ and symmetric throughout Plantars: Bilateral BKA Cerebellar: Unable to assess Gait: not tested due to safety concerns  Data Reviewed  Lab Results: Basic  Metabolic Panel: Recent Labs  Lab 07/08/18 1357 07/09/18 0409 07/10/18 0840  NA 137 138 137  K 4.4 4.8 6.3*  CL 97* 99 103  CO2 23 23 18*  GLUCOSE 126* 83 183*  BUN 43* 61* 80*  CREATININE 6.00* 7.17* 8.46*  CALCIUM 8.0* 8.1* 8.6*  MG  --  2.2  --     Liver Function Tests: Recent Labs  Lab 07/08/18 1357  AST 22  ALT 17  ALKPHOS 85  BILITOT 0.4  PROT 7.2  ALBUMIN 3.5   No results for input(s): LIPASE, AMYLASE in the last 168 hours. Recent Labs  Lab 07/09/18 0409  AMMONIA <9*    CBC: Recent Labs  Lab 07/08/18 1357 07/09/18 0409 07/10/18 0840  WBC 8.4 7.6 9.5  NEUTROABS 6.5  --   --   HGB 14.1 13.1 13.5  HCT 47.0* 43.7 43.7  MCV 95.1 95.6 93.0  PLT 127* 128* 122*    Cardiac Enzymes: Recent Labs  Lab 07/08/18 1357 07/08/18 2012 07/09/18 0409 07/10/18 0840  TROPONINI 0.09* 0.11* 0.15* 0.09*    Lipid Panel: Recent Labs  Lab 07/08/18 1357  CHOL 153  TRIG 313*  HDL 35*  CHOLHDL 4.4  VLDL 63*  LDLCALC 55    CBG: Recent Labs  Lab 07/10/18 0443 07/10/18 0452 07/10/18 0511 07/10/18 0527 07/10/18 0755  GLUCAP 25* 19* 31* 156* 171*    Microbiology: Results for orders placed or performed during the hospital encounter of 07/08/18  MRSA PCR Screening     Status: Abnormal   Collection Time: 07/08/18  3:42  PM  Result Value Ref Range Status   MRSA by PCR POSITIVE (A) NEGATIVE Final    Comment:        The GeneXpert MRSA Assay (FDA approved for NASAL specimens only), is one component of a comprehensive MRSA colonization surveillance program. It is not intended to diagnose MRSA infection nor to guide or monitor treatment for MRSA infections. RESULT CALLED TO, READ BACK BY AND VERIFIED WITH: DILLON WHITE 07/08/18 @ [facility] Performed at [facility], [address] [place], [place] [address]     Coagulation Studies: Recent Labs    07/08/18 1357  LABPROT 15.3*  INR 1.2    Imaging: Ct Head Wo Contrast  Result Date:  07/09/2018 CLINICAL DATA:  Altered mental status. Speech disturbance. Patient status post administration of TPA 07/08/2018. EXAM: CT HEAD WITHOUT CONTRAST TECHNIQUE: Contiguous axial images were obtained from the base of the skull through the vertex without intravenous contrast. COMPARISON:  Head CT scan 0 3/had CT scan and brain MRI 07/08/2018 FINDINGS: Brain: No evidence of acute infarction, hemorrhage, hydrocephalus, extra-axial collection or mass lesion/mass effect. Chronic microvascular ischemic change noted. Vascular: Atherosclerosis is seen. Skull: Intact.  No focal lesion. Sinuses/Orbits: Complete opacification left sphenoid sinus is unchanged. Other: None. IMPRESSION: No acute abnormality. Atrophy and chronic microvascular ischemic change. Atherosclerosis. Chronic left sphenoid sinus disease. Electronically Signed   By: Inge Rise M.D.   On: 07/09/2018 14:17   Mr Jodene Nam Head Wo Contrast  Result Date: 07/08/2018 CLINICAL DATA:  Altered mental status. Speech disturbance. Dialysis patient. EXAM: MRI HEAD WITHOUT CONTRAST MRA HEAD WITHOUT CONTRAST TECHNIQUE: Multiplanar, multiecho pulse sequences of the brain and surrounding structures were obtained without intravenous contrast. Angiographic images of the head were obtained using MRA technique without contrast. COMPARISON:  Head CT same day.  MRI 03/14/2018. FINDINGS: MRI HEAD FINDINGS Brain: The study suffers from some motion degradation. Diffusion imaging does not show any acute or subacute infarction. The brainstem is normal. Few old small vessel cerebellar infarctions. Cerebral hemispheres show age related atrophy with mild to moderate chronic small-vessel ischemic changes of the white matter. Old small vessel thalamic infarction on the left. No cortical or large vessel territory infarction. No mass lesion, hemorrhage, hydrocephalus or extra-axial collection. Vascular: Major vessels at the base of the brain show flow. Skull and upper cervical spine:  Negative Sinuses/Orbits: Sphenoid sinusitis. Other sinuses are clear. Orbits negative. Other: None MRA HEAD FINDINGS Severe motion degradation. Both internal carotid arteries show flow. There is flow in the anterior and middle cerebral vessels. Both vertebral arteries are patent to the basilar. The basilar artery and major posterior circulation branch vessels show flow. Detail beyond that is not reliable. IMPRESSION: No acute brain finding. Chronic small-vessel ischemic changes of the cerebellum, left thalamus and cerebral hemispheric white matter. MR angiogram is markedly degraded by motion. There does not appear to be any large vessel occlusion. Beyond that, detail is not reliable because of motion. Electronically Signed   By: Nelson Chimes M.D.   On: 07/08/2018 17:08   Mr Brain Wo Contrast  Result Date: 07/08/2018 CLINICAL DATA:  Altered mental status. Speech disturbance. Dialysis patient. EXAM: MRI HEAD WITHOUT CONTRAST MRA HEAD WITHOUT CONTRAST TECHNIQUE: Multiplanar, multiecho pulse sequences of the brain and surrounding structures were obtained without intravenous contrast. Angiographic images of the head were obtained using MRA technique without contrast. COMPARISON:  Head CT same day.  MRI 03/14/2018. FINDINGS: MRI HEAD FINDINGS Brain: The study suffers from some motion degradation. Diffusion imaging  does not show any acute or subacute infarction. The brainstem is normal. Few old small vessel cerebellar infarctions. Cerebral hemispheres show age related atrophy with mild to moderate chronic small-vessel ischemic changes of the white matter. Old small vessel thalamic infarction on the left. No cortical or large vessel territory infarction. No mass lesion, hemorrhage, hydrocephalus or extra-axial collection. Vascular: Major vessels at the base of the brain show flow. Skull and upper cervical spine: Negative Sinuses/Orbits: Sphenoid sinusitis. Other sinuses are clear. Orbits negative. Other: None MRA HEAD  FINDINGS Severe motion degradation. Both internal carotid arteries show flow. There is flow in the anterior and middle cerebral vessels. Both vertebral arteries are patent to the basilar. The basilar artery and major posterior circulation branch vessels show flow. Detail beyond that is not reliable. IMPRESSION: No acute brain finding. Chronic small-vessel ischemic changes of the cerebellum, left thalamus and cerebral hemispheric white matter. MR angiogram is markedly degraded by motion. There does not appear to be any large vessel occlusion. Beyond that, detail is not reliable because of motion. Electronically Signed   By: Nelson Chimes M.D.   On: 07/08/2018 17:08   US Carotid Bilateral  Result Date: 07/09/2018 CLINICAL DATA:  CVA.  Altered mental status. EXAM: BILATERAL CAROTID DUPLEX ULTRASOUND TECHNIQUE: Pearline Cables scale imaging, color Doppler and duplex ultrasound were performed of bilateral carotid and vertebral arteries in the neck. COMPARISON:  03/14/2018 FINDINGS: Criteria: Quantification of carotid stenosis is based on velocity parameters that correlate the residual internal carotid diameter with NASCET-based stenosis levels, using the diameter of the distal internal carotid lumen as the denominator for stenosis measurement. The following velocity measurements were obtained: RIGHT ICA: 65 cm/sec CCA: 93 cm/sec SYSTOLIC ICA/CCA RATIO:  0.6 ECA: 61 cm/sec LEFT ICA: 85 cm/sec CCA: 71 cm/sec SYSTOLIC ICA/CCA RATIO:  1.2 ECA: 62 cm/sec RIGHT CAROTID ARTERY: Again noted is extensive echogenic plaque in the right common carotid artery. No significant stenosis based on the velocity measurements and waveforms. Echogenic plaque at the right carotid bulb. External carotid artery is patent with normal waveform. Echogenic plaque in the internal carotid artery. Normal waveforms and velocities in the internal carotid artery. Portion of the distal internal carotid artery is not well visualized due to shadowing plaque. RIGHT  VERTEBRAL ARTERY: Antegrade flow and normal waveform in the right vertebral artery. LEFT CAROTID ARTERY: Extensive echogenic plaque in the proximal left common carotid artery. Echogenic plaque at the left carotid bulb. External carotid artery is patent with normal waveform. Echogenic and shadowing plaque in the internal carotid artery. Normal waveforms and velocities in the internal carotid artery. LEFT VERTEBRAL ARTERY: Antegrade flow and normal waveform in the left vertebral artery. IMPRESSION: Extensive echogenic calcified plaque throughout the bilateral carotid arteries without significant stenosis. Estimated degree of stenosis in the internal carotid arteries is less than 50% bilaterally. Patent vertebral arteries with antegrade flow. Electronically Signed   By: Markus Daft M.D.   On: 07/09/2018 10:53   Dg Chest Portable 1 View  Result Date: 07/08/2018 CLINICAL DATA:  Altered mental status. EXAM: PORTABLE CHEST 1 VIEW COMPARISON:  04/23/2018. FINDINGS: Stable enlarged cardiac silhouette. Interval mild patchy opacities in both lungs, right greater than left. Stable post CABG changes. Stable left jugular double-lumen catheter with its tip in the mid right atrium. Unremarkable bones. IMPRESSION: 1. Interval mild bilateral alveolar edema or pneumonia, right greater than left. 2. Stable cardiomegaly. Electronically Signed   By: Claudie Revering M.D.   On: 07/08/2018 17:41   Ct Head Code Stroke Wo  Contrast  Addendum Date: 07/08/2018   ADDENDUM REPORT: 07/08/2018 14:15 ADDENDUM: Study discussed by telephone with Dr. Eula Listen on 07/08/2018 at 1357 hours. Electronically Signed   By: Genevie Ann M.D.   On: 07/08/2018 14:15   Result Date: 07/08/2018 CLINICAL DATA:  Code stroke. 68 year old female with left facial droop and syncope. Last known well 1250 hours. EXAM: CT HEAD WITHOUT CONTRAST TECHNIQUE: Contiguous axial images were obtained from the base of the skull through the vertex without intravenous  contrast. COMPARISON:  Head CT 03/17/2018. Brain MRI 03/14/2018 and earlier. FINDINGS: Brain: No midline shift, mass effect, or evidence of intracranial mass lesion. No ventriculomegaly. No acute intracranial hemorrhage identified. New heterogeneity of the left thalamus on series 2, image 14, but this would result in right side symptoms. Gray-white matter differentiation otherwise appears stable. No cortically based acute infarct identified. Vascular: Extensive Calcified atherosclerosis at the skull base. No suspicious intracranial vascular hyperdensity. Skull: Negative. Sinuses/Orbits: Chronic left sphenoid sinusitis, otherwise stable and well pneumatized. Other: No acute orbit or scalp soft tissue findings. There is some calcified scalp vessel atherosclerosis. ASPECTS [facility] Stroke Program Early CT Score) - Ganglionic level infarction (caudate, lentiform nuclei, internal capsule, insula, M1-M3 cortex): 7 - Supraganglionic infarction (M4-M6 cortex): 3 Total score (0-10 with 10 being normal): 10 IMPRESSION: 1. 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 2. No acute cortically based infarct or acute intracranial hemorrhage identified. ASPECTS is 10. 3. Calcified atherosclerosis, including in scalp vessels which can be seen in the setting of in stage renal disease. Electronically Signed: By: Genevie Ann M.D. On: 07/08/2018 13:52    Medications:  I have reviewed the patient's current medications. Prior to Admission:  Medications Prior to Admission  Medication Sig Dispense Refill Last Dose  . acetaminophen (TYLENOL) 325 MG tablet Take 2 tablets (650 mg total) by mouth every 6 (six) hours as needed for mild pain (or Fever >/= 101).   Unknown at PRN  . apixaban (ELIQUIS) 2.5 MG TABS tablet Take 1 tablet (2.5 mg total) by mouth 2 (two) times daily. 60 tablet 0 07/08/2018 at 0800  . aspirin EC 81 MG tablet Take 1 tablet (81 mg total) by mouth daily.    07/08/2018 at 0800  . atorvastatin (LIPITOR) 80 MG tablet Take 80 mg by mouth at bedtime.    07/07/2018 at 2000  . brimonidine (ALPHAGAN) 0.2 % ophthalmic solution Place 1 drop into both eyes 3 (three) times daily. 5 mL 12 07/08/2018 at 0800  . carvedilol (COREG) 6.25 MG tablet Take 1 tablet (6.25 mg total) by mouth 2 (two) times daily.   07/08/2018 at 0800  . docusate sodium (COLACE) 100 MG capsule Take 1 capsule (100 mg total) by mouth 2 (two) times daily. 10 capsule 0 07/08/2018 at 0800  . epoetin alfa (EPOGEN,PROCRIT) 06301 UNIT/ML injection Inject 1 mL (10,000 Units total) into the vein Every Tuesday,Thursday,and Saturday with dialysis. 1 mL  As directed at As directed  . famotidine (PEPCID) 20 MG tablet Take 20 mg by mouth 2 (two) times daily.   07/08/2018 at 0800  . folic acid (FOLVITE) 1 MG tablet Take 1 tablet (1 mg total) by mouth daily. 30 tablet 3 07/08/2018 at 0800  . isosorbide mononitrate (IMDUR) 30 MG 24 hr tablet Take 30 mg by mouth daily.    07/08/2018 at 0800  . levETIRAcetam (KEPPRA) 250 MG tablet Three times weekly after dialysis (Patient taking differently: Take 250 mg  by mouth See admin instructions. Take 1 tablet (259m) by mouth every Tuesday, Thursday and Saturday afternoon)   07/07/2018 at 1600  . levETIRAcetam (KEPPRA) 500 MG tablet Take 1 tablet (500 mg total) by mouth 2 (two) times daily.   07/08/2018 at 0800  . midodrine (PROAMATINE) 10 MG tablet Take 1 tablet (10 mg total) by mouth 3 (three) times a week. (Patient taking differently: Take 10 mg by mouth See admin instructions. Take 1 tablet (166m by mouth every Tuesday, Thursday and Saturday) 30 tablet 0 07/07/2018 at Unknown time  . mirtazapine (REMERON) 7.5 MG tablet Take 7.5 mg by mouth daily.    07/07/2018 at Unknown time  . multivitamin (RENA-VIT) TABS tablet Take 1 tablet by mouth at bedtime. 30 tablet 0 07/08/2018 at 0800  . nitroGLYCERIN (NITROSTAT) 0.4 MG SL tablet Place 1 tablet (0.4 mg total) under the tongue every 5  (five) minutes as needed for chest pain. 30 tablet 0 Unknown at PRN  . Nutritional Supplements (FEEDING SUPPLEMENT, NEPRO CARB STEADY,) LIQD Take 237 mLs by mouth 2 (two) times daily between meals. 60 Can 0 06/09/2018 at Unknown time  . Omega-3 1000 MG CAPS Take 2 capsules by mouth daily.    07/08/2018 at 0800  . ondansetron (ZOFRAN) 4 MG tablet Take 4 mg by mouth every 8 (eight) hours as needed for nausea or vomiting.   Unknown at PRN  . Polyethyl Glycol-Propyl Glycol 0.4-0.3 % SOLN Place 1 drop into both eyes 2 (two) times daily.   07/08/2018 at 0800  . rOPINIRole (REQUIP) 0.25 MG tablet Take 1 tablet (0.25 mg total) by mouth at bedtime.   07/07/2018 at 2000  . sevelamer carbonate (RENVELA) 800 MG tablet Take 1,600 mg by mouth 3 (three) times daily with meals.    07/08/2018 at 0800  . timolol (TIMOPTIC) 0.5 % ophthalmic solution Place 1 drop into both eyes 3 (three) times daily. 10 mL 12 07/08/2018 at 0800  . traMADol (ULTRAM) 50 MG tablet Take 1 tablet (50 mg total) by mouth every 8 (eight) hours as needed for moderate pain or severe pain. 15 tablet 0 Unknown at PRN  . valsartan (DIOVAN) 80 MG tablet Take 80 mg by mouth daily.    07/08/2018 at 0800  . vitamin B-12 (CYANOCOBALAMIN) 1000 MCG tablet Take 1 tablet (1,000 mcg total) by mouth daily. 30 tablet 0 07/08/2018 at 0800  . vitamin C (VITAMIN C) 500 MG tablet Take 1 tablet (500 mg total) by mouth 2 (two) times daily. 60 tablet 0 07/08/2018 at 0800   Scheduled: . atorvastatin  80 mg Oral QHS  . brimonidine  1 drop Both Eyes TID  . calcitRIOL  0.5 mcg Oral Daily  . Chlorhexidine Gluconate Cloth  6 each Topical Q0600  . feeding supplement (NEPRO CARB STEADY)  237 mL Oral BID BM  . insulin aspart  0-5 Units Subcutaneous QHS  . insulin aspart  0-9 Units Subcutaneous TID WC  . levETIRAcetam  250 mg Oral Q T,Th,Sat-1800  . levETIRAcetam  500 mg Oral BID  . mirtazapine  7.5 mg Oral QHS  . multivitamin  1 tablet Oral QHS  . mupirocin ointment  1  application Nasal BID  . polyvinyl alcohol  1 drop Both Eyes BID  . sevelamer carbonate  800 mg Oral TID WC  . timolol  1 drop Both Eyes TID   Assessment: [age].o. female with past medical history of seizure disorder, PVD, ESRD on hemodialysis, diabetes mellitus, chronic diastolic CHF, bilateral  below-knee amputation, endocarditis of tricuspid valve, CAD, MI, remote history of atrial fibrillation with RVR presenting to the ED from hemodialysis center with altered mental status and significant expressive aphasia.  Initial CT head negative for hemorrhage or acute core infarct.  Initial NIH stroke scale 12.  Status post IV TPA thrombolytics @1401  on 07/08/2018. Patient tolerated tPA without complication. Neuro status improved and now back to baseline with no focal neurologic deficit.  Imaging at 24 hours shows no hemorrhage. MRI showed no acute brain finding. Patient was on renal dose Eliquis (Apixaban) 2.5 mg BID prior to admission. Patient was restarted on Eliquis (apixaban) daily at prior renal dose for secondary stroke prevention.  Patent may have likely had a seizure but she refused further evaluation with EEG.  Plan 1. Continue medical management with Eliquis 2.5 mg twice daily and Aspirin 81 mg/day as previously prescribed. 2. Continue statin with goal ow density lipoprotein (LDL) <70 mg/dl. 3. Continue  Keppra 526m po BID and extra dose of 250 mg after Dialysis 4. Discontinue Tramadol due to potential of lowering seizure threshold 4.No further neurologic intervention is recommended at this time.  If further questions arise, please call or page at that time.  Thank you for allowing neurology to participate in the care of this patient.  Patient to follow up with neurology on an outpatient basis  This patient was staffed with Dr. ZIrish Elders YAlease Framewho personally evaluated patient, reviewed documentation and agreed with assessment and plan of care as above.  ERufina Falco DNP, FNP-BC Board  certified Nurse Practitioner Neurology Department    LOS: 2 days   07/10/2018  11:23 AM

## 2018-07-10 NOTE — Progress Notes (Signed)
Pt's CVC malfunctioning, poor flow. Lumen reversal, saline flushes and patient repositioning unsuccessful.  Unable to run higher than 250 mL/min blood flow rate no dialysis tx. MD notified.   MD ordered Activase dwell. Tx paused and will resume tx after dwell.     07/10/18 1300  Vital Signs  Pulse Rate 68  Resp 19  BP (!) 110/56 (Monitoring BP closely. )  Oxygen Therapy  SpO2 100 %  During Hemodialysis Assessment  Blood Flow Rate (mL/min) 250 mL/min  Arterial Pressure (mmHg) -260 mmHg  Venous Pressure (mmHg) 190 mmHg  Transmembrane Pressure (mmHg) 40 mmHg  Ultrafiltration Rate (mL/min) 570 mL/min  Dialysate Flow Rate (mL/min) 800 ml/min  Conductivity: Machine  14  HD Safety Checks Performed Yes  Intra-Hemodialysis Comments See progress note (catheter still malfunctioning after line reversal MDnotified)

## 2018-07-10 NOTE — Progress Notes (Signed)
Post HD Tx   Pt signed off with 1 hour remaining (AMA), pt educated on risks considering elevated potassium. Final 1.5 hours of tx, HD perm cath functioned well at 400BFR after Activase dwell of 45 mins.  Total of 1.5 liters of fluid removed today.    07/10/18 1645  Hand-Off documentation  Report given to (Full Name) [facility] RN 1C  Report received from (Full Name) Trellis Paganini RN  Vital Signs  Temp 98.6 F (37 C)  Temp Source Oral  Pulse Rate 76  Pulse Rate Source Monitor  Resp 13  BP 134/82  BP Location Right Arm  BP Method Automatic  Patient Position (if appropriate) Lying  Oxygen Therapy  SpO2 100 %  O2 Device Room Air  Dialysis Weight  Weight 46.7 kg  Type of Weight Post-Dialysis  Post-Hemodialysis Assessment  Rinseback Volume (mL) 250 mL  KECN 52.7 V  Dialyzer Clearance Lightly streaked  Duration of HD Treatment -hour(s) 3.5 hour(s) (only completed 2.5hours d/t signing off AMA)  Hemodialysis Intake (mL) 500 mL  UF Total -Machine (mL) 2000 mL  Net UF (mL) 1500 mL  Tolerated HD Treatment Yes  Post-Hemodialysis Comments pt stable  Education / Care Plan  Dialysis Education Provided Yes  Documented Education in Care Plan Yes  Hemodialysis Catheter Left Subclavian Double-lumen  Placement Date: 02/11/18   Placed prior to admission: No  Orientation: Left  Access Location: Subclavian  Hemodialysis Catheter Type: Double-lumen  Site Condition No complications  Blue Lumen Status Flushed;Saline locked;Capped (Central line);Heparin locked  Red Lumen Status Flushed;Saline locked;Capped (Central line);Heparin locked  Catheter fill solution Heparin 1000 units/ml  Catheter fill volume (Arterial) 1.7 cc (1.7)  Catheter fill volume (Venous) 1.7  Dressing Type Gauze/Drain sponge;Occlusive  Dressing Status Clean;Dry;Intact  Drainage Description None  Post treatment catheter status Capped and Clamped

## 2018-07-10 NOTE — Progress Notes (Signed)
HD Tx resumed, CVC functioning at 400 BFR after Cath Flow dwell. Arterial pressures remain too negative, yet improved.    07/10/18 1445  Vital Signs  Pulse Rate 69  BP (!) 151/70  Oxygen Therapy  SpO2 97 %  During Hemodialysis Assessment  Blood Flow Rate (mL/min) 400 mL/min  Arterial Pressure (mmHg) -240 mmHg  Venous Pressure (mmHg) 190 mmHg  Transmembrane Pressure (mmHg) 50 mmHg  Ultrafiltration Rate (mL/min) 570 mL/min  Dialysate Flow Rate (mL/min) 800 ml/min  Conductivity: Machine  14  HD Safety Checks Performed Yes  Intra-Hemodialysis Comments Progressing as prescribed (Arterial pressure remains low,yet able to run400BFR)

## 2018-07-10 NOTE — Progress Notes (Signed)
HD Tx End   07/10/18 1640  Vital Signs  Pulse Rate 76  Resp 15  BP (!) 150/86  Oxygen Therapy  SpO2 99 %  Pain Assessment  Pain Scale 0-10  Pain Score 0  During Hemodialysis Assessment  Blood Flow Rate (mL/min) 200 mL/min  Arterial Pressure (mmHg) -160 mmHg  Venous Pressure (mmHg) 140 mmHg  Transmembrane Pressure (mmHg) 60 mmHg  Ultrafiltration Rate (mL/min) 570 mL/min  Dialysate Flow Rate (mL/min) 800 ml/min  Conductivity: Machine  14  HD Safety Checks Performed Yes  KECN 52.7 KECN  Dialysis Fluid Bolus Normal Saline  Bolus Amount (mL) 250 mL  Intra-Hemodialysis Comments Tx completed (Tx ended with 1 hour remaining, pt signed off AMA.Educated)

## 2018-07-11 LAB — CBC
HCT: 40.1 % (ref 36.0–46.0)
Hemoglobin: 11.8 g/dL — ABNORMAL LOW (ref 12.0–15.0)
MCH: 28.4 pg (ref 26.0–34.0)
MCHC: 29.4 g/dL — ABNORMAL LOW (ref 30.0–36.0)
MCV: 96.4 fL (ref 80.0–100.0)
Platelets: 95 10*3/uL — ABNORMAL LOW (ref 150–400)
RBC: 4.16 MIL/uL (ref 3.87–5.11)
RDW: 21.5 % — ABNORMAL HIGH (ref 11.5–15.5)
WBC: 7 10*3/uL (ref 4.0–10.5)
nRBC: 0 % (ref 0.0–0.2)

## 2018-07-11 LAB — BASIC METABOLIC PANEL
Anion gap: 12 (ref 5–15)
BUN: 38 mg/dL — ABNORMAL HIGH (ref 8–23)
CO2: 25 mmol/L (ref 22–32)
Calcium: 8.4 mg/dL — ABNORMAL LOW (ref 8.9–10.3)
Chloride: 101 mmol/L (ref 98–111)
Creatinine, Ser: 6.03 mg/dL — ABNORMAL HIGH (ref 0.44–1.00)
GFR calc Af Amer: 8 mL/min — ABNORMAL LOW (ref >60)
GFR calc non Af Amer: 7 mL/min — ABNORMAL LOW (ref >60)
Glucose, Bld: 182 mg/dL — ABNORMAL HIGH (ref 70–99)
Potassium: 4.3 mmol/L (ref 3.5–5.1)
Sodium: 138 mmol/L (ref 135–145)

## 2018-07-11 LAB — GLUCOSE, CAPILLARY
Glucose-Capillary: 121 mg/dL — ABNORMAL HIGH (ref 70–99)
Glucose-Capillary: 207 mg/dL — ABNORMAL HIGH (ref 70–99)
Glucose-Capillary: 208 mg/dL — ABNORMAL HIGH (ref 70–99)

## 2018-07-11 LAB — GLUCOSE, POCT (MANUAL RESULT ENTRY): POC Glucose: 151 mg/dl — AB (ref 70–99)

## 2018-07-11 MED ORDER — SEVELAMER CARBONATE 800 MG PO TABS
2400.0000 mg | ORAL_TABLET | Freq: Three times a day (TID) | ORAL | Status: DC
  Administered 2018-07-11 – 2018-07-13 (×7): 2400 mg via ORAL
  Filled 2018-07-11 (×8): qty 3

## 2018-07-11 MED ORDER — FAMOTIDINE 20 MG PO TABS
20.0000 mg | ORAL_TABLET | Freq: Every day | ORAL | Status: DC
  Administered 2018-07-11 – 2018-07-12 (×2): 20 mg via ORAL
  Filled 2018-07-11 (×2): qty 1

## 2018-07-11 MED ORDER — CARVEDILOL 3.125 MG PO TABS
12.5000 mg | ORAL_TABLET | Freq: Two times a day (BID) | ORAL | Status: DC
  Administered 2018-07-11 – 2018-07-12 (×2): 12.5 mg via ORAL
  Filled 2018-07-11 (×2): qty 4

## 2018-07-11 NOTE — Progress Notes (Signed)
PT Cancellation Note  Patient Details Name: Stephanie Ellis MRN: [id number] DOB: 07/22/1950   Cancelled Treatment:    Reason Eval/Treat Not Completed: Medical issues which prohibited therapy(waiting on IV consult.) Waiting on IV restart consult, per record patient pulled hers out yesterday. Pt. On IV pepcid on HS. Will attempt again at later time today.   Janna Arch, PT, DPT   07/11/2018, 11:15 AM

## 2018-07-11 NOTE — Progress Notes (Signed)
Pt has returned from HD in stable condition. Care updated with supper ordered.

## 2018-07-11 NOTE — Progress Notes (Signed)
[place] [facility] [place] NAME: Stephanie Ellis    MR#:  [id number]  DATE OF BIRTH:  April 12, 1951  SUBJECTIVE:  CHIEF COMPLAINT:   Chief Complaint  Patient presents with  . Altered Mental Status   No new complaint this morning.  Patient awake and alert and oriented this morning.  Reported to have pulled out IV last night.  IV team initially consulted but was unsuccessful with getting IV access.  Nursing staff was electively to get peripheral IV access today.   REVIEW OF SYSTEMS:  Review of Systems  Constitutional: Negative for chills, fever and malaise/fatigue.  HENT: Negative for congestion, sinus pain and sore throat.   Eyes: Negative for blurred vision, double vision and pain.       Positive vision loss left eye from "old infection"   Respiratory: Negative for cough, shortness of breath and wheezing.   Cardiovascular: Negative for chest pain, palpitations and leg swelling.  Gastrointestinal: Negative for abdominal pain, nausea and vomiting.  Genitourinary: Negative for dysuria and flank pain.  Musculoskeletal: Positive for (bilateral BKAs) Skin: Negative for itching and rash.  Neurological: Negative for dizziness, focal weakness, weakness and headaches.  Psychiatric/Behavioral: Negative for depression and hallucinations. Marland Kitchen   DRUG ALLERGIES:   Allergies  Allergen Reactions  . Chlorthalidone Anaphylaxis, Itching and Rash  . Fentanyl Rash  . Midazolam Rash  . Ace Inhibitors Other (See Comments)    Reaction:  Hyperkalemia, agitation   . Angiotensin Receptor Blockers Other (See Comments)    Reaction:  Hyperkalemia, agitation   . Norvasc [Amlodipine] Itching and Rash  . Phenergan [Promethazine Hcl] Anxiety    "antsy, can't sit still"   VITALS:  Blood pressure (!) 171/70, pulse 70, temperature 98.1 F (36.7 C), temperature source Oral, resp. rate 20, height 5' 4"  (1.626 m), weight 47.4 kg, SpO2 98 %. PHYSICAL EXAMINATION:  GENERAL:68  y.o.-year-old patient lying in the bed with no acute distress.  EYES:Left pupil irregular and dilated, right pupil reactive to light HEENT: Head atraumatic, normocephalic. Oropharynx clear. NECK: Supple, no jugular venous distention. No thyroid enlargement, no tenderness.  LUNGS: Normal breath sounds bilaterally, no wheezing, rales,rhonchi or crepitation. No use of accessory muscles of respiration.  CARDIOVASCULAR: S1, S2 HQIONG.2/9 systolicmurmur. norubs, or gallops.  ABDOMEN: Soft, nontender, nondistended. Bowel sounds present.  EXTREMITIES: No pedal edema, cyanosis, or clubbing.Left lower extremity today wrapped in bandage. Bilateral lower extremity BKAs NEUROLOGIC: Finger to nose testing normal. grip strength equal bilaterally. Facial movements symmetrical. No slurred speech.Moving extremities in bed without difficulty.Sensation intact. Gait not checked. PSYCHIATRIC: The patient is alert and oriented x 3 today.  LABORATORY PANEL:  Female CBC Recent Labs  Lab 07/11/18 0533  WBC 7.0  HGB 11.8*  HCT 40.1  PLT 95*   ------------------------------------------------------------------------------------------------------------------ Chemistries  Recent Labs  Lab 07/08/18 1357 07/09/18 0409  07/11/18 0533  NA 137 138   < > 138  K 4.4 4.8   < > 4.3  CL 97* 99   < > 101  CO2 23 23   < > 25  GLUCOSE 126* 83   < > 182*  BUN 43* 61*   < > 38*  CREATININE 6.00* 7.17*   < > 6.03*  CALCIUM 8.0* 8.1*   < > 8.4*  MG  --  2.2  --   --   AST 22  --   --   --   ALT 17  --   --   --  ALKPHOS 85  --   --   --   BILITOT 0.4  --   --   --    < > = values in this interval not displayed.   RADIOLOGY:  No results found. ASSESSMENT AND PLAN:   68 year old patient with past medical history of diabetes, sinusitis, CAD s/p CABG x 3, PVD, s/p bilateral BKAs, atrial fibrillation, hyperlipidemia, GERD, HTN, ESRD on hemodialysis, CHF who presented to the ED with altered mental status  following dialysis. Was initially unresponsive on arrival. Started on tPA in the ED. Infusion was started at 1401 however prior to completion of the infusion, it was found that patient was on anticoagulation Eliquis for Afib. Was initially in the ICU now on med/surg.  Acute metabolic encephalopathy; appear clinically resolved  Seizure disorder Improved and back to baseline.  This morning patient awake and alert and oriented x3. Patient has a documented history of known seizure disorder, refused EEG recently.  No evidence of any seizure activity clinically.  Discontinued tramadol for seizure risk per NP [facility]  Neurology consulted appreciate input-s/p tPA for CVA-neuromonitoring in progress,   -brain imaging reassuring with chronic stable findings - US carotid: Extensive echogenic calcified plaque throughout the bilateral carotid arteries without significant stenosis. Estimated degree of stenosis in the internal carotid arteries is less than 50% bilaterally. Patent vertebral arteries with antegrade flow. - repeat CT head - no hemorrhage -  MRI no acute findings, chronic small vessel ischemic changes -  restart Eliquis 2.5 mg daily, restarted aspirin - s/p TTE: LVEF 35-40%. Aortic valve abnormalities, possibly bicuspid.  -Monitor for s/sx of bleeding and transfuse for hgb <7 or active signs of bleeding - Continue Keppra Continue statin  Afib Eliquis, carvedilol.  Hypertensive urgency Blood pressure controlled improved but not yet optimal. Increased Coreg from 6.25 to 12.5 mg p.o. twice daily. Hydralazine and labetalol PRN  ESRD on HD  Continue inpatient hemodialysis per nephrology. - pharmacy consultation for electrolytes - continue Renvela, calcitriol, re-ordered her renal vitamins  - nutrition following   Recent hyperkalemia which was treated and resolved with hemodialysis.  Left BKA stump infection pseudomonas aeruginosa-completed course of antibiotics per ID recommendations)  Vascular surgery consulted appreciate input- continue wound care, no surgical intervention  Wound care  Chronic systolic CHF.  Stable Echo 07/10/18 shows LVEF 35-40%. Patient is on a beta-blocker and allegedly valsartan at home. Attempted to order equivalent ARB today, system flagged for allergy. Hold off for now. Monitor for signs of fluid overload.  Diabetes - ICU hypoglycemic\Hyperglycemia protocol - check FSBS per protocol.  Blood sugars fairly controlled.  Monitor  Hyperlipidemia Lipitor  GERD Pepcid   DVT prophylaxis patient already on Eliquis  All the records are reviewed and case is discussed with Care Management/Social Worker. Management plans discussed with the patient and/or family and they are in agreement.  CODE STATUS: Partial Code  TOTAL TIME TAKING CARE OF THIS PATIENT: 35 minutes.   More than 50% of the time was spent in counseling/coordination of care: YES  POSSIBLE D/C IN 2 DAYS, DEPENDING ON CLINICAL CONDITION.   Jonothan Heberle  on 07/11/2018 at 11:43 AM  Between 7am to 6pm - Pager - [phone]  After 6 pm go to [email or web address] - [employer] [facility]  Office  [phone]  CC: Primary care physician; Kirk Ruths, MD  Note: This dictation was prepared with Dragon dictation along with smaller phrase technology. Any transcriptional errors that result from this process  are unintentional.

## 2018-07-11 NOTE — Progress Notes (Signed)
Pt removed her IV access this morning due to, "it was hurting".  States she feels like "I'm here but I'm not." IV team unsuccessful after 2 failed attempts. Dr. Stark Jock notified and will speak to Dr. Holley Raring related to IV access and left arm precautions. Saline lock was achieved by inpt nursing staff with Dr. Stark Jock updated. HD scheduled for today with pt prepared.  Denies pain. Eating well. Slept at intervals. Pt currently in dialysis unit.

## 2018-07-11 NOTE — Progress Notes (Signed)
PT Cancellation Note  Patient Details Name: Elfreda Blanchet MRN: [id number] DOB: 08/21/1950   Cancelled Treatment:    Reason Eval/Treat Not Completed: Patient at procedure or test/unavailable(dialysis). Patient at dialysis. Will attempt again at later time.   Janna Arch, PT, DPT   07/11/2018, 3:22 PM

## 2018-07-11 NOTE — Care Management Important Message (Signed)
Important Message  Patient Details  Name: Stephanie Ellis MRN: [id number] Date of Birth: [birth year]   Medicare Important Message Given:  Yes    Chrisa Hassan A Phuong Moffatt, RN 07/11/2018, 9:55 AM

## 2018-07-11 NOTE — Progress Notes (Signed)
[facility]  ROUNDING NOTE   Subjective:  Peripheral IV access lost overnight. Due for dialysis today.   Objective:  Vital signs in last 24 hours:  Temp:  [97.5 F (36.4 C)-98.6 F (37 C)] 98.1 F (36.7 C) (03/21 0849) Pulse Rate:  [63-88] 70 (03/21 0849) Resp:  [12-25] 20 (03/21 0849) BP: (110-197)/(44-93) 171/70 (03/21 0849) SpO2:  [94 %-100 %] 98 % (03/21 0849) Weight:  [46.7 kg-48.2 kg] 47.4 kg (03/20 1818)  Weight change: 0 kg Filed Weights   07/10/18 1230 07/10/18 1645 07/10/18 1818  Weight: 48.2 kg 46.7 kg 47.4 kg    Intake/Output: I/O last 3 completed shifts: In: 83.3 [I.V.:34.8; IV Piggyback:48.6] Out: 1500 [Other:1500]   Intake/Output this shift:  Total I/O In: 43.7 [IV Piggyback:43.7] Out: -   Physical Exam: General: No acute distress  Head: Normocephalic, atraumatic. Moist oral mucosal membranes  Eyes: Anicteric  Neck: Supple, trachea midline  Lungs:  Clear to auscultation, normal effort  Heart: S1S2 no rubs  Abdomen:  Soft, nontender, bowel sounds present  Extremities: B/L BKA  Neurologic: Awake, alert, following commands, oriented to self  Skin: No lesions  Access: IJ permcath    Basic Metabolic Panel: Recent Labs  Lab 07/08/18 1357 07/09/18 0409 07/10/18 0840 07/11/18 0533  NA 137 138 137 138  K 4.4 4.8 6.3* 4.3  CL 97* 99 103 101  CO2 23 23 18* 25  GLUCOSE 126* 83 183* 182*  BUN 43* 61* 80* 38*  CREATININE 6.00* 7.17* 8.46* 6.03*  CALCIUM 8.0* 8.1* 8.6* 8.4*  MG  --  2.2  --   --   PHOS  --   --  9.8*  --     Liver Function Tests: Recent Labs  Lab 07/08/18 1357  AST 22  ALT 17  ALKPHOS 85  BILITOT 0.4  PROT 7.2  ALBUMIN 3.5   No results for input(s): LIPASE, AMYLASE in the last 168 hours. Recent Labs  Lab 07/09/18 0409  AMMONIA <9*    CBC: Recent Labs  Lab 07/08/18 1357 07/09/18 0409 07/10/18 0840 07/11/18 0533  WBC 8.4 7.6 9.5 7.0  NEUTROABS 6.5  --   --   --   HGB 14.1 13.1 13.5 11.8*  HCT  47.0* 43.7 43.7 40.1  MCV 95.1 95.6 93.0 96.4  PLT 127* 128* 122* 95*    Cardiac Enzymes: Recent Labs  Lab 07/08/18 1357 07/08/18 2012 07/09/18 0409 07/10/18 0840  TROPONINI 0.09* 0.11* 0.15* 0.09*    BNP: Invalid input(s): POCBNP  CBG: Recent Labs  Lab 07/10/18 0755 07/10/18 1136 07/10/18 1755 07/10/18 2102 07/11/18 0754  GLUCAP 171* 257* 176* 267* 121*    Microbiology: Results for orders placed or performed during the hospital encounter of 07/08/18  MRSA PCR Screening     Status: Abnormal   Collection Time: 07/08/18  3:42 PM  Result Value Ref Range Status   MRSA by PCR POSITIVE (A) NEGATIVE Final    Comment:        The GeneXpert MRSA Assay (FDA approved for NASAL specimens only), is one component of a comprehensive MRSA colonization surveillance program. It is not intended to diagnose MRSA infection nor to guide or monitor treatment for MRSA infections. RESULT CALLED TO, READ BACK BY AND VERIFIED WITH: DILLON WHITE 07/08/18 @ [facility] Performed at [facility], [address] [place], [place] [address]     Coagulation Studies: Recent Labs    07/08/18 1357  LABPROT 15.3*  INR 1.2  Urinalysis: No results for input(s): COLORURINE, LABSPEC, PHURINE, GLUCOSEU, HGBUR, BILIRUBINUR, KETONESUR, PROTEINUR, UROBILINOGEN, NITRITE, LEUKOCYTESUR in the last 72 hours.  Invalid input(s): APPERANCEUR    Imaging: Ct Head Wo Contrast  Result Date: 07/09/2018 CLINICAL DATA:  Altered mental status. Speech disturbance. Patient status post administration of TPA 07/08/2018. EXAM: CT HEAD WITHOUT CONTRAST TECHNIQUE: Contiguous axial images were obtained from the base of the skull through the vertex without intravenous contrast. COMPARISON:  Head CT scan 0 3/had CT scan and brain MRI 07/08/2018 FINDINGS: Brain: No evidence of acute infarction, hemorrhage, hydrocephalus, extra-axial collection or mass lesion/mass effect. Chronic microvascular ischemic change  noted. Vascular: Atherosclerosis is seen. Skull: Intact.  No focal lesion. Sinuses/Orbits: Complete opacification left sphenoid sinus is unchanged. Other: None. IMPRESSION: No acute abnormality. Atrophy and chronic microvascular ischemic change. Atherosclerosis. Chronic left sphenoid sinus disease. Electronically Signed   By: Inge Rise M.D.   On: 07/09/2018 14:17     Medications:   . sodium chloride Stopped (07/10/18 0205)   . apixaban  2.5 mg Oral BID  . aspirin EC  81 mg Oral Daily  . atorvastatin  80 mg Oral QHS  . brimonidine  1 drop Both Eyes TID  . calcitRIOL  0.5 mcg Oral Daily  . carvedilol  6.25 mg Oral BID  . Chlorhexidine Gluconate Cloth  6 each Topical Q0600  . famotidine  20 mg Oral QHS  . feeding supplement (NEPRO CARB STEADY)  237 mL Oral BID BM  . insulin aspart  0-5 Units Subcutaneous TID WC  . isosorbide mononitrate  30 mg Oral Daily  . levETIRAcetam  250 mg Oral Q T,Th,Sat-1800  . levETIRAcetam  500 mg Oral BID  . mirtazapine  7.5 mg Oral QHS  . multivitamin  1 tablet Oral QHS  . mupirocin ointment  1 application Nasal BID  . polyvinyl alcohol  1 drop Both Eyes BID  . sevelamer carbonate  800 mg Oral TID WC  . timolol  1 drop Both Eyes TID   sodium chloride, acetaminophen, hydrALAZINE, labetalol  Assessment/ Plan:  68 y.o. female with end stage renal disease on hemodialysis, hypertension, congestive heart failure, coronary artery disease status post CABG, diabetes mellitus type II insulin dependent, GERD, peripheral vascular disease status post right BKA, history of endocarditis and osteomyelitis  [facility] [place] hemodialysis left IJ permcath  1.  ESRD on HD TTHS: Patient due for hemodialysis today.  Orders have been prepared.  2.  Altered mental status: no definitive CVA on MRI.  Could have represented hypertensive encephalopathy.    3.  Hypertension: Blood pressure currently 171/70.  Maintain the patient on carvedilol.  She has  quite labile blood pressure.  Blood pressure management as per hospitalist.  4.  Secondary hyperparathyroidism: Phosphorus quite high at last check at 9.8.  Increase Renvela to 3 tablets p.o. 3 times daily with meals.   LOS: 3 Appollonia Klee 3/21/202011:41 AM

## 2018-07-11 NOTE — Progress Notes (Signed)
Consult was placed for iv restart, as the pt pulled her iv from yesterday out;  Limited to Right arm only;  Attempted x 2 in right forearm, using ultrasound;  Scar tissue noted; unable to thread catheters.  RN at bedside holding pt's arm;  Has notified MD;  Pt on IV Pepcid at HS;  Other meds are PO, or PRN.   Scheduled for dialysis again today.   Raynelle Fanning RN IV Team

## 2018-07-12 LAB — BASIC METABOLIC PANEL
Anion gap: 9 (ref 5–15)
BUN: 23 mg/dL (ref 8–23)
CO2: 27 mmol/L (ref 22–32)
Calcium: 9.3 mg/dL (ref 8.9–10.3)
Chloride: 104 mmol/L (ref 98–111)
Creatinine, Ser: 4.05 mg/dL — ABNORMAL HIGH (ref 0.44–1.00)
GFR calc Af Amer: 12 mL/min — ABNORMAL LOW (ref >60)
GFR calc non Af Amer: 11 mL/min — ABNORMAL LOW (ref >60)
Glucose, Bld: 236 mg/dL — ABNORMAL HIGH (ref 70–99)
Potassium: 4 mmol/L (ref 3.5–5.1)
Sodium: 140 mmol/L (ref 135–145)

## 2018-07-12 LAB — CBC
HCT: 41.4 % (ref 36.0–46.0)
Hemoglobin: 11.9 g/dL — ABNORMAL LOW (ref 12.0–15.0)
MCH: 28.4 pg (ref 26.0–34.0)
MCHC: 28.7 g/dL — ABNORMAL LOW (ref 30.0–36.0)
MCV: 98.8 fL (ref 80.0–100.0)
Platelets: 102 10*3/uL — ABNORMAL LOW (ref 150–400)
RBC: 4.19 MIL/uL (ref 3.87–5.11)
RDW: 20.9 % — ABNORMAL HIGH (ref 11.5–15.5)
WBC: 7.3 10*3/uL (ref 4.0–10.5)
nRBC: 0 % (ref 0.0–0.2)

## 2018-07-12 LAB — GLUCOSE, CAPILLARY
Glucose-Capillary: 151 mg/dL — ABNORMAL HIGH (ref 70–99)
Glucose-Capillary: 209 mg/dL — ABNORMAL HIGH (ref 70–99)
Glucose-Capillary: 179 mg/dL — ABNORMAL HIGH (ref 70–99)
Glucose-Capillary: 226 mg/dL — ABNORMAL HIGH (ref 70–99)

## 2018-07-12 LAB — MAGNESIUM: Magnesium: 2.1 mg/dL (ref 1.7–2.4)

## 2018-07-12 LAB — PHOSPHORUS: Phosphorus: 4 mg/dL (ref 2.5–4.6)

## 2018-07-12 MED ORDER — CARVEDILOL 25 MG PO TABS
25.0000 mg | ORAL_TABLET | Freq: Two times a day (BID) | ORAL | Status: DC
  Administered 2018-07-12 – 2018-07-13 (×2): 25 mg via ORAL
  Filled 2018-07-12 (×2): qty 1

## 2018-07-12 NOTE — Progress Notes (Signed)
[facility]  ROUNDING NOTE   Subjective:  Patient completed hemodialysis yesterday. Tolerated well. Resting comfortably in bed at the moment.   Objective:  Vital signs in last 24 hours:  Temp:  [98 F (36.7 C)-99 F (37.2 C)] 98 F (36.7 C) (03/22 0800) Pulse Rate:  [63-79] 68 (03/22 0800) Resp:  [13-17] 16 (03/22 0800) BP: (92-179)/(51-73) 169/73 (03/22 0800) SpO2:  [90 %-100 %] 98 % (03/22 0800) Weight:  [49.7 kg-51.2 kg] 49.7 kg (03/21 1814)  Weight change: 3 kg Filed Weights   07/10/18 1818 07/11/18 1415 07/11/18 1814  Weight: 47.4 kg 51.2 kg 49.7 kg    Intake/Output: I/O last 3 completed shifts: In: 43.7 [IV Piggyback:43.7] Out: 750 [Other:750]   Intake/Output this shift:  No intake/output data recorded.  Physical Exam: General: No acute distress  Head: Normocephalic, atraumatic. Moist oral mucosal membranes  Eyes: Anicteric  Neck: Supple, trachea midline  Lungs:  Clear to auscultation, normal effort  Heart: S1S2 no rubs  Abdomen:  Soft, nontender, bowel sounds present  Extremities: B/L BKA  Neurologic: Awake, alert, following commands, oriented to self  Skin: No lesions  Access: IJ permcath    Basic Metabolic Panel: Recent Labs  Lab 07/08/18 1357 07/09/18 0409 07/10/18 0840 07/11/18 0533 07/12/18 0440  NA 137 138 137 138 140  K 4.4 4.8 6.3* 4.3 4.0  CL 97* 99 103 101 104  CO2 23 23 18* 25 27  GLUCOSE 126* 83 183* 182* 236*  BUN 43* 61* 80* 38* 23  CREATININE 6.00* 7.17* 8.46* 6.03* 4.05*  CALCIUM 8.0* 8.1* 8.6* 8.4* 9.3  MG  --  2.2  --   --  2.1  PHOS  --   --  9.8*  --  4.0    Liver Function Tests: Recent Labs  Lab 07/08/18 1357  AST 22  ALT 17  ALKPHOS 85  BILITOT 0.4  PROT 7.2  ALBUMIN 3.5   No results for input(s): LIPASE, AMYLASE in the last 168 hours. Recent Labs  Lab 07/09/18 0409  AMMONIA <9*    CBC: Recent Labs  Lab 07/08/18 1357 07/09/18 0409 07/10/18 0840 07/11/18 0533 07/12/18 0440  WBC 8.4  7.6 9.5 7.0 7.3  NEUTROABS 6.5  --   --   --   --   HGB 14.1 13.1 13.5 11.8* 11.9*  HCT 47.0* 43.7 43.7 40.1 41.4  MCV 95.1 95.6 93.0 96.4 98.8  PLT 127* 128* 122* 95* 102*    Cardiac Enzymes: Recent Labs  Lab 07/08/18 1357 07/08/18 2012 07/09/18 0409 07/10/18 0840  TROPONINI 0.09* 0.11* 0.15* 0.09*    BNP: Invalid input(s): POCBNP  CBG: Recent Labs  Lab 07/11/18 1139 07/11/18 1838 07/11/18 2148 07/12/18 0726 07/12/18 1137  GLUCAP 207* 151* 208* 209* 179*    Microbiology: Results for orders placed or performed during the hospital encounter of 07/08/18  MRSA PCR Screening     Status: Abnormal   Collection Time: 07/08/18  3:42 PM  Result Value Ref Range Status   MRSA by PCR POSITIVE (A) NEGATIVE Final    Comment:        The GeneXpert MRSA Assay (FDA approved for NASAL specimens only), is one component of a comprehensive MRSA colonization surveillance program. It is not intended to diagnose MRSA infection nor to guide or monitor treatment for MRSA infections. RESULT CALLED TO, READ BACK BY AND VERIFIED WITH: DILLON WHITE 07/08/18 @ [facility] Performed at [facility], [address], [place], [place] [address]  Coagulation Studies: No results for input(s): LABPROT, INR in the last 72 hours.  Urinalysis: No results for input(s): COLORURINE, LABSPEC, PHURINE, GLUCOSEU, HGBUR, BILIRUBINUR, KETONESUR, PROTEINUR, UROBILINOGEN, NITRITE, LEUKOCYTESUR in the last 72 hours.  Invalid input(s): APPERANCEUR    Imaging: No results found.   Medications:   . sodium chloride Stopped (07/10/18 0205)   . apixaban  2.5 mg Oral BID  . aspirin EC  81 mg Oral Daily  . atorvastatin  80 mg Oral QHS  . brimonidine  1 drop Both Eyes TID  . calcitRIOL  0.5 mcg Oral Daily  . carvedilol  25 mg Oral BID  . Chlorhexidine Gluconate Cloth  6 each Topical Q0600  . famotidine  20 mg Oral QHS  . feeding supplement (NEPRO CARB STEADY)  237 mL Oral BID BM  . insulin  aspart  0-5 Units Subcutaneous TID WC  . isosorbide mononitrate  30 mg Oral Daily  . levETIRAcetam  250 mg Oral Q T,Th,Sat-1800  . levETIRAcetam  500 mg Oral BID  . mirtazapine  7.5 mg Oral QHS  . multivitamin  1 tablet Oral QHS  . mupirocin ointment  1 application Nasal BID  . polyvinyl alcohol  1 drop Both Eyes BID  . sevelamer carbonate  2,400 mg Oral TID WC  . timolol  1 drop Both Eyes TID   sodium chloride, acetaminophen, hydrALAZINE, labetalol  Assessment/ Plan:  68 y.o. female with end stage renal disease on hemodialysis, hypertension, congestive heart failure, coronary artery disease status post CABG, diabetes mellitus type II insulin dependent, GERD, peripheral vascular disease status post right BKA, history of endocarditis and osteomyelitis  [facility]ph[facility] [place] hemodialysis left IJ permcath  1.  ESRD on HD TTHS: Patient completed dialysis yesterday.  No acute indication for dialysis today.  Next dialysis treatment for Tuesday.  2.  Altered mental status: no definitive CVA on MRI.  Could have represented hypertensive encephalopathy.    3.  Hypertension: Patient continues to have labile blood pressure.  Blood pressure has been as low as 92/65.  Currently blood pressure 169/73.  She will be maintained on carvedilol 25 mg twice daily.  4.  Secondary hyperparathyroidism: Phosphorus down to 4.0.  Maintain the patient Renvela 3 tablets p.o. 3 times daily with meals.   LOS: 4 Stephanie Ellis 3/22/202012:55 PM

## 2018-07-12 NOTE — Progress Notes (Signed)
PT Cancellation Note  Patient Details Name: Kaylina Cahue MRN: [id number] DOB: 01/23/1951   Cancelled Treatment:    Reason Eval/Treat Not Completed: Patient declined, no reason specified(pretended to be asleep) Patient pretended to be sleeping upon PT entering. Will attempt again at a later time/date.   Janna Arch, PT, DPT   07/12/2018, 2:29 PM

## 2018-07-12 NOTE — Progress Notes (Signed)
[facility] [place] NAME: Stephanie Ellis    MR#:  [id number]  DATE OF BIRTH:  [birth year]  SUBJECTIVE:  CHIEF COMPLAINT:   Chief Complaint  Patient presents with  . Altered Mental Status   No new complaint this morning.  Patient awake and alert and oriented this morning. No fevers  REVIEW OF SYSTEMS:  Review of Systems  Constitutional: Negative for chills, fever and malaise/fatigue.  HENT: Negative for congestion, sinus pain and sore throat.   Eyes: Negative for blurred vision, double vision and pain.       Positive vision loss left eye from "old infection"   Respiratory: Negative for cough, shortness of breath and wheezing.   Cardiovascular: Negative for chest pain, palpitations and leg swelling.  Gastrointestinal: Negative for abdominal pain, nausea and vomiting.  Genitourinary: Negative for dysuria and flank pain.  Musculoskeletal: Positive for (bilateral BKAs) Skin: Negative for itching and rash.  Neurological: Negative for dizziness, focal weakness, weakness and headaches.  Psychiatric/Behavioral: Negative for depression and hallucinations. Marland Kitchen   DRUG ALLERGIES:   Allergies  Allergen Reactions  . Chlorthalidone Anaphylaxis, Itching and Rash  . Fentanyl Rash  . Midazolam Rash  . Ace Inhibitors Other (See Comments)    Reaction:  Hyperkalemia, agitation   . Angiotensin Receptor Blockers Other (See Comments)    Reaction:  Hyperkalemia, agitation   . Norvasc [Amlodipine] Itching and Rash  . Phenergan [Promethazine Hcl] Anxiety    "antsy, can't sit still"   VITALS:  Blood pressure (!) 169/73, pulse 68, temperature 98 F (36.7 C), temperature source Oral, resp. rate 16, height 5' 4"  (1.626 m), weight 49.7 kg, SpO2 98 %. PHYSICAL EXAMINATION:  GENERAL:68 y.o.-year-old patient lying in the bed with no acute distress.  EYES:Left pupil irregular and dilated, right pupil reactive to light HEENT: Head atraumatic, normocephalic.  Oropharynx clear. NECK: Supple, no jugular venous distention. No thyroid enlargement, no tenderness.  LUNGS: Normal breath sounds bilaterally, no wheezing, rales,rhonchi or crepitation. No use of accessory muscles of respiration.  CARDIOVASCULAR: S1, S2 OVFIEP.3/2 systolicmurmur. norubs, or gallops.  ABDOMEN: Soft, nontender, nondistended. Bowel sounds present.  EXTREMITIES: No pedal edema, cyanosis, or clubbing.Left lower extremity today wrapped in bandage. Bilateral lower extremity BKAs NEUROLOGIC: Finger to nose testing normal. grip strength equal bilaterally. Facial movements symmetrical. No slurred speech.Moving extremities in bed without difficulty.Sensation intact. Gait not checked. PSYCHIATRIC: The patient is alert and oriented x 3 today.  LABORATORY PANEL:  Female CBC Recent Labs  Lab 07/12/18 0440  WBC 7.3  HGB 11.9*  HCT 41.4  PLT 102*   ------------------------------------------------------------------------------------------------------------------ Chemistries  Recent Labs  Lab 07/08/18 1357  07/12/18 0440  NA 137   < > 140  K 4.4   < > 4.0  CL 97*   < > 104  CO2 23   < > 27  GLUCOSE 126*   < > 236*  BUN 43*   < > 23  CREATININE 6.00*   < > 4.05*  CALCIUM 8.0*   < > 9.3  MG  --    < > 2.1  AST 22  --   --   ALT 17  --   --   ALKPHOS 85  --   --   BILITOT 0.4  --   --    < > = values in this interval not displayed.   RADIOLOGY:  No results found. ASSESSMENT AND PLAN:   68 year old patient with past medical  history of diabetes, sinusitis, CAD s/p CABG x 3, PVD, s/p bilateral BKAs, atrial fibrillation, hyperlipidemia, GERD, HTN, ESRD on hemodialysis, CHF who presented to the ED with altered mental status following dialysis. Was initially unresponsive on arrival. Started on tPA in the ED. Infusion was started at 1401 however prior to completion of the infusion, it was found that patient was on anticoagulation Eliquis for Afib. Was initially in the ICU now  on med/surg.  Acute metabolic encephalopathy; appear clinically resolved  Seizure disorder Improved and back to baseline.  This morning patient awake and alert and oriented x3. Patient has a documented history of known seizure disorder, refused EEG recently.  No evidence of any seizure activity clinically.  Discontinued tramadol for seizure risk per NP [facility]  Neurology consulted appreciate input-s/p tPA for CVA-neuromonitoring in progress,   -brain imaging reassuring with chronic stable findings - US carotid: Extensive echogenic calcified plaque throughout the bilateral carotid arteries without significant stenosis. Estimated degree of stenosis in the internal carotid arteries is less than 50% bilaterally. Patent vertebral arteries with antegrade flow. - repeat CT head - no hemorrhage -  MRI no acute findings, chronic small vessel ischemic changes -  restart Eliquis 2.5 mg daily, restarted aspirin - s/p TTE: LVEF 35-40%. Aortic valve abnormalities, possibly bicuspid.  -Monitor for s/sx of bleeding and transfuse for hgb <7 or active signs of bleeding - Continue Keppra Continue statin  Afib Eliquis, carvedilol.  Hypertensive urgency Blood pressure controlled improved but not yet optimal. Increased Coreg to 25 mg p.o. twice daily. Hydralazine and labetalol PRN  ESRD on HD  Continue inpatient hemodialysis per nephrology. - pharmacy consultation for electrolytes - continue Renvela, calcitriol, re-ordered her renal vitamins  - nutrition following   Recent hyperkalemia which was treated and resolved with hemodialysis.  Left BKA stump infection pseudomonas aeruginosa-completed course of antibiotics per ID recommendations) Vascular surgery consulted appreciate input- continue wound care, no surgical intervention  Wound care  Chronic systolic CHF.  Stable Echo 07/10/18 shows LVEF 35-40%. Patient is on a beta-blocker and allegedly valsartan at home. Attempted to order equivalent ARB  today, system flagged for allergy. Hold off for now. Monitor for signs of fluid overload.  Diabetes - ICU hypoglycemic\Hyperglycemia protocol - check FSBS per protocol.  Blood sugars fairly controlled.  Monitor  Hyperlipidemia Lipitor  GERD Pepcid   DVT prophylaxis patient already on Eliquis  All the records are reviewed and case is discussed with Care Management/Social Worker. Management plans discussed with the patient and/or family and they are in agreement.  CODE STATUS: Partial Code   Disposition; anticipate discharge back to nursing facility after the weekend  [place] THIS PATIENT: 35 minutes.   More than 50% of the time was spent in counseling/coordination of care: [place]  on 07/12/2018 at 12:24 PM  Between 7am to 6pm - Pager - [phone]  After 6 pm go to [email or web address] - Technical brewer [place] [facility]  Office  [phone]  CC: Primary care physician; Kirk Ruths, MD  Note: This dictation was prepared with Dragon dictation along with smaller phrase technology. Any transcriptional errors that result from this process are unintentional.

## 2018-07-12 NOTE — Progress Notes (Signed)
PT Cancellation Note  Patient Details Name: Stephanie Ellis MRN: [id number] DOB: Oct 24, 1950   Cancelled Treatment:    Reason Eval/Treat Not Completed: Patient declined, no reason specified(states she doesn't want to right now, come back later) PT entered patient room with sliding board. Patient told PT she does not want to physical therapy right now despite PT educating her on need to see her ability to transfer. States she transfers with sliding board at home by herself but refuses to show PT. Will attempt again at later time.   Janna Arch, PT, DPT   07/12/2018, 10:46 AM

## 2018-07-13 LAB — GLUCOSE, CAPILLARY
Glucose-Capillary: 192 mg/dL — ABNORMAL HIGH (ref 70–99)
Glucose-Capillary: 200 mg/dL — ABNORMAL HIGH (ref 70–99)

## 2018-07-13 MED ORDER — CARVEDILOL 25 MG PO TABS: 25.0000 mg | ORAL_TABLET | Freq: Two times a day (BID) | ORAL | 0 refills | Status: AC

## 2018-07-13 MED ORDER — HYDRALAZINE HCL 50 MG PO TABS
25.0000 mg | ORAL_TABLET | Freq: Three times a day (TID) | ORAL | Status: DC
  Administered 2018-07-13: 25 mg via ORAL
  Filled 2018-07-13: qty 1

## 2018-07-13 MED ORDER — HYDRALAZINE HCL 25 MG PO TABS: 25.0000 mg | ORAL_TABLET | Freq: Three times a day (TID) | ORAL | 0 refills | Status: DC

## 2018-07-13 NOTE — Plan of Care (Signed)
Pt is returning to [facility].  Admitted for possible stroke after having syncopal episode at dialysis.  MRI was negative for anything acute. Pt is back to baseline - A&O.  She has been having elevated BP and they started her on hydralazine tid.  IV removed by task nurse.  Report called to [place] at [facility].  Called EMS for transport.

## 2018-07-13 NOTE — Progress Notes (Signed)
   07/13/18 1300  Clinical Encounter Type  Visited With Patient  Visit Type Follow-up  Ch introduced herself and let the pt know that chaplain services are available. Pt was about to get discharged.

## 2018-07-13 NOTE — Care Management Important Message (Signed)
Important Message  Patient Details  Name: Stephanie Ellis MRN: [id number] Date of Birth: [birth year]   Medicare Important Message Given:  Yes Reviewed with patient over the phone and placed in patient discharge packet to go back with her to [facility].     Shelbie Hutching, RN 07/13/2018, 11:06 AM

## 2018-07-13 NOTE — Discharge Summary (Signed)
[facility] [place] NAME: Stephanie Ellis    MR#:  [id number]  DATE OF BIRTH:  1951-03-20  DATE OF ADMISSION:  07/08/2018   ADMITTING PHYSICIAN: Loletha Grayer, MD  DATE OF DISCHARGE: 07/13/2018  PRIMARY CARE PHYSICIAN: Kirk Ruths, MD   ADMISSION DIAGNOSIS:  CVA (cerebral vascular accident) ([place]) [I63.9] Wound infection [T14.8XXA, L08.9] Acute CVA (cerebrovascular accident) ([place]) [I63.9] Hypertension, unspecified type [I10] DISCHARGE DIAGNOSIS:  Active Problems:   CVA (cerebral vascular accident) ([place])  SECONDARY DIAGNOSIS:   Past Medical History:  Diagnosis Date  . (HFpEF) heart failure with preserved ejection fraction ([place])    a. 01/2018 Echo: EF 50-55%, no rwma, Gr1 DD, triv AI, mild MR. Midly dil LA/RV, mod reduced RV fxn. Irreg thickening of TV w/ mobile echodensity that appears to arise from valve.  . Anemia   . Anorexia   . Bacteremia due to Pseudomonas   . Carotid arterial disease ([place])    a. 01/2017 Carotid U/S: RICA <44, LICA <01.  Marland Kitchen Complication of anesthesia    a. Pt reports h/o complication on 9 different occasions - ? hypotension/arrest.  . 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 01/2015 [facility]): 70% LM, p-mLAD 50-60% (Resting FFR 0.75), mRCA 80-90%, ~40 Ost OM & D1-->CABG; b. 01/2016 Staged PCI of LCX x 2 and RCA.  Marland Kitchen ESRD (end stage renal disease) on dialysis ([place])    a. ESRD secondary to acute kidney failure s/p CABG-->PD  . Essential hypertension   . GERD (gastroesophageal reflux disease)   . Heart murmur   . Hypercholesterolemia   . Myocardial infarction ([place]) 2016  . PVD (peripheral vascular disease) ([place])    a. 01/30/2018 [facility] Angio: Sev Left Popliteal dzs s/p PTA w/ 73m lutonix DEB w/ mech aspiration of the L popliteal, L AT, and Left tibioperoneal trunck and peroneal artery.  . 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  [facility]: Dr. BMarland KitchenHaithcock: CABG x 3, LIMA to LAD,  SVG to RCA, SVG to OM3, EVH  . Sinusitis 2019  . Type II diabetes mellitus with complication ([place]    CAD   HOSPITAL COURSE:  Chief complaint; altered mental status   History of presenting complaint; DRonan Dion is a 68y.o. female sent in for altered mental status and was found to be aphasic.  Apparently the patient lost responsiveness over at dialysis.  Blood pressure was very high on presentation.  Code stroke was called.  TPA was running when I saw the patient and was almost through.  Patient able to shake her head to some questions.  Patient moving her left leg very well.  She did not follow commands on moving her other extremities .  Please refer to the H&P dictated for further details   Hospital course; Acutemetabolicencephalopathy;  clinically resolved  Seizure disorder Improved and back to baseline.  This morning patient awake and alert and oriented x3. Patient has a documented history of known seizure disorder, refused EEG recently.  No evidence of any seizure activity clinically.  [facility] Neurology consulted appreciate input-s/p tPA for CVA-neuromonitoring in progress,  -brain imaging reassuring with chronic stable findings - UKoreacarotid: Extensive echogenic calcified plaque throughout the bilateral carotid arteries without significant stenosis. Estimated degree of stenosis in the internal carotid arteries is less than 50% bilaterally. Patent vertebral arteries with antegrade flow. - repeat CT head - no  hemorrhage -  MRI no acute findings, chronic small vessel ischemic changes -  restart Eliquis 2.5 mg daily, restarted aspirin - s/p TTE: LVEF 35-40%. Aortic valve abnormalities, possibly bicuspid. -ContinueKeppra Continue statin Patient clinically and hemodynamically stable for discharge back to nursing facility today.  Afib Continue Eliquis and carvedilol.  Hypertensive urgency Blood pressure noted to be intermittently  uncontrolled.  Dose of Coreg increased from 6.25 to 25 mg p.o. twice daily during this admission.  Hydralazine 25 mg p.o. 3 times daily also initiated prior to discharge.  MD at nursing facility to monitor blood pressure and adjust meds as needed.   ESRD on HD  Seen by nephrologist and had hemodialysis done during this admission.  To resume outpatient hemodialysis on discharge   LeftBKAstump infection pseudomonas aeruginosa-completed course of antibiotics per ID recommendations) Vascular surgery consulted appreciate input- continue wound care,no surgical intervention  Patient reevaluated by wound care nurse recently.  Patient was adamant she would not allow wound VAC to be replaced.Understands it would lead to healing in less time. Continue daily wound dressing with Xeroform  Chronic systolic CHF.  Stable Echo 07/10/18 shows LVEF 35-40%. Patient is on a beta-blocker and allegedly valsartan at home. Attempted to order equivalent ARB today, system flagged for allergy and this was placed on hold  Diabetes Blood sugars fairly controlled.  Outpatient monitoring by MD at rehab  Hyperlipidemia Lipitor  GERD Pepcid  DISCHARGE CONDITIONS:  Patient clinically and hemodynamically stable for discharge back to nursing facility. CONSULTS OBTAINED:  Treatment Team:  Catarina Hartshorn, MD Leotis Pain, MD DRUG ALLERGIES:   Allergies  Allergen Reactions  . Chlorthalidone Anaphylaxis, Itching and Rash  . Fentanyl Rash  . Midazolam Rash  . Ace Inhibitors Other (See Comments)    Reaction:  Hyperkalemia, agitation   . Angiotensin Receptor Blockers Other (See Comments)    Reaction:  Hyperkalemia, agitation   . Norvasc [Amlodipine] Itching and Rash  . Phenergan [Promethazine Hcl] Anxiety    "antsy, can't sit still"   DISCHARGE MEDICATIONS:   Allergies as of 07/13/2018      Reactions   Chlorthalidone Anaphylaxis, Itching, Rash   Fentanyl Rash   Midazolam Rash   Ace Inhibitors  Other (See Comments)   Reaction:  Hyperkalemia, agitation    Angiotensin Receptor Blockers Other (See Comments)   Reaction:  Hyperkalemia, agitation    Norvasc [amlodipine] Itching, Rash   Phenergan [promethazine Hcl] Anxiety   "antsy, can't sit still"      Medication List    STOP taking these medications   midodrine 10 MG tablet Commonly known as:  PROAMATINE   valsartan 80 MG tablet Commonly known as:  DIOVAN     TAKE these medications   acetaminophen 325 MG tablet Commonly known as:  TYLENOL Take 2 tablets (650 mg total) by mouth every 6 (six) hours as needed for mild pain (or Fever >/= 101).   apixaban 2.5 MG Tabs tablet Commonly known as:  ELIQUIS Take 1 tablet (2.5 mg total) by mouth 2 (two) times daily.   ascorbic acid 500 MG tablet Commonly known as:  VITAMIN C Take 1 tablet (500 mg total) by mouth 2 (two) times daily.   aspirin EC 81 MG tablet Take 1 tablet (81 mg total) by mouth daily.   atorvastatin 80 MG tablet Commonly known as:  LIPITOR Take 80 mg by mouth at bedtime.   brimonidine 0.2 % ophthalmic solution Commonly known as:  ALPHAGAN Place 1 drop into both eyes 3 (three) times  daily.   carvedilol 25 MG tablet Commonly known as:  COREG Take 1 tablet (25 mg total) by mouth 2 (two) times daily. What changed:    medication strength  how much to take   docusate sodium 100 MG capsule Commonly known as:  COLACE Take 1 capsule (100 mg total) by mouth 2 (two) times daily.   epoetin alfa 10000 UNIT/ML injection Commonly known as:  EPOGEN,PROCRIT Inject 1 mL (10,000 Units total) into the vein Every Tuesday,Thursday,and Saturday with dialysis.   famotidine 20 MG tablet Commonly known as:  PEPCID Take 20 mg by mouth 2 (two) times daily.   feeding supplement (NEPRO CARB STEADY) Liqd Take 237 mLs by mouth 2 (two) times daily between meals.   folic acid 1 MG tablet Commonly known as:  FOLVITE Take 1 tablet (1 mg total) by mouth daily.    hydrALAZINE 25 MG tablet Commonly known as:  APRESOLINE Take 1 tablet (25 mg total) by mouth 3 (three) times daily for 30 days.   isosorbide mononitrate 30 MG 24 hr tablet Commonly known as:  IMDUR Take 30 mg by mouth daily.   levETIRAcetam 500 MG tablet Commonly known as:  KEPPRA Take 1 tablet (500 mg total) by mouth 2 (two) times daily. What changed:  Another medication with the same name was changed. Make sure you understand how and when to take each.   levETIRAcetam 250 MG tablet Commonly known as:  KEPPRA Three times weekly after dialysis What changed:    how much to take  how to take this  when to take this  additional instructions   mirtazapine 7.5 MG tablet Commonly known as:  REMERON Take 7.5 mg by mouth daily.   multivitamin Tabs tablet Take 1 tablet by mouth at bedtime.   nitroGLYCERIN 0.4 MG SL tablet Commonly known as:  NITROSTAT Place 1 tablet (0.4 mg total) under the tongue every 5 (five) minutes as needed for chest pain.   Omega-3 1000 MG Caps Take 2 capsules by mouth daily.   ondansetron 4 MG tablet Commonly known as:  ZOFRAN Take 4 mg by mouth every 8 (eight) hours as needed for nausea or vomiting.   Polyethyl Glycol-Propyl Glycol 0.4-0.3 % Soln Place 1 drop into both eyes 2 (two) times daily.   rOPINIRole 0.25 MG tablet Commonly known as:  REQUIP Take 1 tablet (0.25 mg total) by mouth at bedtime.   sevelamer carbonate 800 MG tablet Commonly known as:  RENVELA Take 1,600 mg by mouth 3 (three) times daily with meals.   timolol 0.5 % ophthalmic solution Commonly known as:  TIMOPTIC Place 1 drop into both eyes 3 (three) times daily.   vitamin B-12 1000 MCG tablet Commonly known as:  CYANOCOBALAMIN Take 1 tablet (1,000 mcg total) by mouth daily.        DISCHARGE INSTRUCTIONS:   DIET:  Diabetic diet DISCHARGE CONDITION:  Stable ACTIVITY:  Up in bed.  Patient status post bilateral BKA. OXYGEN:  Home Oxygen: No.  Oxygen  Delivery: room air DISCHARGE LOCATION:  nursing home   If you experience worsening of your admission symptoms, develop shortness of breath, life threatening emergency, suicidal or homicidal thoughts you must seek medical attention immediately by calling 911 or calling your MD immediately  if symptoms less severe.  You Must read complete instructions/literature along with all the possible adverse reactions/side effects for all the Medicines you take and that have been prescribed to you. Take any new Medicines after you have completely understood and  accpet all the possible adverse reactions/side effects.   Please note  You were cared for by a hospitalist during your hospital stay. If you have any questions about your discharge medications or the care you received while you were in the hospital after you are discharged, you can call the unit and asked to speak with the hospitalist on call if the hospitalist that took care of you is not available. Once you are discharged, your primary care physician will handle any further medical issues. Please note that NO REFILLS for any discharge medications will be authorized once you are discharged, as it is imperative that you return to your primary care physician (or establish a relationship with a primary care physician if you do not have one) for your aftercare needs so that they can reassess your need for medications and monitor your lab values.    On the day of Discharge:  VITAL SIGNS:  Blood pressure (!) 182/73, pulse 68, temperature 98.1 F (36.7 C), temperature source Oral, resp. rate 16, height 5' 4"  (1.626 m), weight 49.7 kg, SpO2 98 %. PHYSICAL EXAMINATION:  GENERAL:  68 y.o.-year-old patient lying in the bed with no acute distress.  EYES: Pupils equal, round, reactive to light and accommodation. No scleral icterus. Extraocular muscles intact.  HEENT: Head atraumatic, normocephalic. Oropharynx and nasopharynx clear.  NECK:  Supple, no jugular  venous distention. No thyroid enlargement, no tenderness.  LUNGS: Normal breath sounds bilaterally, no wheezing, rales,rhonchi or crepitation. No use of accessory muscles of respiration.  CARDIOVASCULAR: S1, S2 normal. No murmurs, rubs, or gallops.  ABDOMEN: Soft, non-tender, non-distended. Bowel sounds present. No organomegaly or mass.  EXTREMITIES: Left lower extremity today wrapped in bandage. Bilateral lower extremityBKAs NEUROLOGIC: Cranial nerves II through XII are intact. Muscle strength 5/5 in all extremities. Sensation intact. Gait not checked.  PSYCHIATRIC: The patient is alert and oriented x 3.  SKIN: No obvious rash, lesion, or ulcer.  DATA REVIEW:   CBC Recent Labs  Lab 07/12/18 0440  WBC 7.3  HGB 11.9*  HCT 41.4  PLT 102*    Chemistries  Recent Labs  Lab 07/08/18 1357  07/12/18 0440  NA 137   < > 140  K 4.4   < > 4.0  CL 97*   < > 104  CO2 23   < > 27  GLUCOSE 126*   < > 236*  BUN 43*   < > 23  CREATININE 6.00*   < > 4.05*  CALCIUM 8.0*   < > 9.3  MG  --    < > 2.1  AST 22  --   --   ALT 17  --   --   ALKPHOS 85  --   --   BILITOT 0.4  --   --    < > = values in this interval not displayed.     Microbiology Results  Results for orders placed or performed during the hospital encounter of 07/08/18  MRSA PCR Screening     Status: Abnormal   Collection Time: 07/08/18  3:42 PM  Result Value Ref Range Status   MRSA by PCR POSITIVE (A) NEGATIVE Final    Comment:        The GeneXpert MRSA Assay (FDA approved for NASAL specimens only), is one component of a comprehensive MRSA colonization surveillance program. It is not intended to diagnose MRSA infection nor to guide or monitor treatment for MRSA infections. RESULT CALLED TO, READ BACK BY AND VERIFIED WITH: DILLON  WHITE 07/08/18 @ [place] Performed at [facility], [address] [place],  [address]     RADIOLOGY:  No results found.   Management plans discussed with the  patient, family and they are in agreement.  CODE STATUS: Partial Code   TOTAL TIME TAKING CARE OF THIS PATIENT: 37 minutes.    Anivea Velasques M.D on 07/13/2018 at 10:07 AM  Between 7am to 6pm - Pager - [phone]  After 6pm go to [email or web address] - Technical brewer [place] [facility]  Office  [phone]  CC: Primary care physician; Kirk Ruths, MD   Note: This dictation was prepared with Dragon dictation along with smaller phrase technology. Any transcriptional errors that result from this process are unintentional.

## 2018-07-13 NOTE — TOC Transition Note (Signed)
Transition of Care [facility]) - CM/SW Discharge Note   Patient Details  Name: Stephanie Ellis MRN: [id number] Date of Birth: 1950/05/20  [facility] CM/SW Contact:  Annamaria Boots, [place] Phone Number: 07/13/2018, 11:12 AM   Clinical Narrative:   Patient is medically ready for discharge today back to [facility]. CSW notified patient and daughter Stephanie Ellis [phone]. CSW also notified Magda Paganini at [facility] of discharge today. Patient will be transported by EMS. RN to call report and call for transport.     Final next level of care: [place] Barriers to Discharge: No Barriers Identified   Patient Goals and CMS Choice Patient states their goals for this hospitalization and ongoing recovery are:: Return to [employer] Compare Post Acute Care list provided to:: Patient Choice offered to / list presented to : Patient  Discharge Placement   Existing PASRR number confirmed : 07/09/18          Patient [facility] bed at: [facility] Patient to be transferred to facility by: EMS Name of family member notified: Stephanie Ellis- daughter [phone] Patient and family notified of of transfer: 07/13/18  Discharge Plan and Services   Discharge Planning Services: CM Consult                      Social Determinants of Health ([place]) Interventions     Readmission Risk Interventions No flowsheet data found.

## 2018-07-13 NOTE — Progress Notes (Signed)
[facility]  ROUNDING NOTE   Subjective:   No complaints. No events overnight.   Objective:  Vital signs in last 24 hours:  Temp:  [98.1 F (36.7 C)] 98.1 F (36.7 C) (03/23 0411) Pulse Rate:  [62-70] 68 (03/23 0840) Resp:  [16] 16 (03/23 0411) BP: (136-182)/(55-73) 136/55 (03/23 1020) SpO2:  [98 %] 98 % (03/23 0411)  Weight change:  Filed Weights   07/10/18 1818 07/11/18 1415 07/11/18 1814  Weight: 47.4 kg 51.2 kg 49.7 kg    Intake/Output: No intake/output data recorded.   Intake/Output this shift:  No intake/output data recorded.  Physical Exam: General: No acute distress  Head: Normocephalic, atraumatic. Moist oral mucosal membranes  Eyes: Anicteric  Neck: Supple, trachea midline  Lungs:  Clear to auscultation, normal effort  Heart: regular  Abdomen:  Soft, nontender, bowel sounds present  Extremities: bilateral BKA  Neurologic: Awake, alert, oriented  Skin: No lesions  Access: IJ permcath    Basic Metabolic Panel: Recent Labs  Lab 07/08/18 1357 07/09/18 0409 07/10/18 0840 07/11/18 0533 07/12/18 0440  NA 137 138 137 138 140  K 4.4 4.8 6.3* 4.3 4.0  CL 97* 99 103 101 104  CO2 23 23 18* 25 27  GLUCOSE 126* 83 183* 182* 236*  BUN 43* 61* 80* 38* 23  CREATININE 6.00* 7.17* 8.46* 6.03* 4.05*  CALCIUM 8.0* 8.1* 8.6* 8.4* 9.3  MG  --  2.2  --   --  2.1  PHOS  --   --  9.8*  --  4.0    Liver Function Tests: Recent Labs  Lab 07/08/18 1357  AST 22  ALT 17  ALKPHOS 85  BILITOT 0.4  PROT 7.2  ALBUMIN 3.5   No results for input(s): LIPASE, AMYLASE in the last 168 hours. Recent Labs  Lab 07/09/18 0409  AMMONIA <9*    CBC: Recent Labs  Lab 07/08/18 1357 07/09/18 0409 07/10/18 0840 07/11/18 0533 07/12/18 0440  WBC 8.4 7.6 9.5 7.0 7.3  NEUTROABS 6.5  --   --   --   --   HGB 14.1 13.1 13.5 11.8* 11.9*  HCT 47.0* 43.7 43.7 40.1 41.4  MCV 95.1 95.6 93.0 96.4 98.8  PLT 127* 128* 122* 95* 102*    Cardiac Enzymes: Recent Labs   Lab 07/08/18 1357 07/08/18 2012 07/09/18 0409 07/10/18 0840  TROPONINI 0.09* 0.11* 0.15* 0.09*    BNP: Invalid input(s): POCBNP  CBG: Recent Labs  Lab 07/12/18 0726 07/12/18 1137 07/12/18 1639 07/13/18 0748 07/13/18 1140  GLUCAP 209* 179* 226* 192* 200*    Microbiology: Results for orders placed or performed during the hospital encounter of 07/08/18  MRSA PCR Screening     Status: Abnormal   Collection Time: 07/08/18  3:42 PM  Result Value Ref Range Status   MRSA by PCR POSITIVE (A) NEGATIVE Final    Comment:        The GeneXpert MRSA Assay (FDA approved for NASAL specimens only), is one component of a comprehensive MRSA colonization surveillance program. It is not intended to diagnose MRSA infection nor to guide or monitor treatment for MRSA infections. RESULT CALLED TO, READ BACK BY AND VERIFIED WITH: DILLON WHITE 07/08/18 @ [place] Performed at [facility], [address] [place], [place] [address]     Coagulation Studies: No results for input(s): LABPROT, INR in the last 72 hours.  Urinalysis: No results for input(s): COLORURINE, LABSPEC, PHURINE, GLUCOSEU, HGBUR, BILIRUBINUR, KETONESUR, PROTEINUR, UROBILINOGEN, NITRITE, LEUKOCYTESUR in  the last 72 hours.  Invalid input(s): APPERANCEUR    Imaging: No results found.   Medications:   . sodium chloride Stopped (07/10/18 0205)   . apixaban  2.5 mg Oral BID  . aspirin EC  81 mg Oral Daily  . atorvastatin  80 mg Oral QHS  . brimonidine  1 drop Both Eyes TID  . calcitRIOL  0.5 mcg Oral Daily  . carvedilol  25 mg Oral BID  . Chlorhexidine Gluconate Cloth  6 each Topical Q0600  . famotidine  20 mg Oral QHS  . feeding supplement (NEPRO CARB STEADY)  237 mL Oral BID BM  . hydrALAZINE  25 mg Oral Q8H  . insulin aspart  0-5 Units Subcutaneous TID WC  . isosorbide mononitrate  30 mg Oral Daily  . levETIRAcetam  250 mg Oral Q T,Th,Sat-1800  . levETIRAcetam  500 mg Oral BID  . mirtazapine   7.5 mg Oral QHS  . multivitamin  1 tablet Oral QHS  . mupirocin ointment  1 application Nasal BID  . polyvinyl alcohol  1 drop Both Eyes BID  . sevelamer carbonate  2,400 mg Oral TID WC  . timolol  1 drop Both Eyes TID   sodium chloride, acetaminophen, hydrALAZINE, labetalol  Assessment/ Plan:  67 y.o. female with end stage renal disease on hemodialysis, hypertension, congestive heart failure, coronary artery disease status post CABG, diabetes mellitus type II insulin dependent, GERD, peripheral vascular disease status post right BKA, history of endocarditis and osteomyelitis  [facility] [place] [facility] hemodialysis left IJ permcath  1.  ESRD on HD TTHS:  Dialysis for tomorrow.   2.  Anemia of chronic kidney disease: hemoglobin 11.9. ESA as outpatient.   3.  Hypertension: having labile blood pressures - carvedilol.   4.  Secondary hyperparathyroidism: - sevelamer    LOS: 5 Demarco Bacci 3/23/20201:26 PM

## 2018-07-13 NOTE — Progress Notes (Signed)
Occupational Therapy Treatment Patient Details Name: Stephanie Ellis MRN: [id number] DOB: Aug 31, 1950 Today's Date: 07/13/2018    History of present illness 68 y.o. female with past medical history of seizure disorder, PVD, ESRD on hemodialysis, diabetes mellitus, chronic diastolic CHF, bilateral below-knee amputation, endocarditis of tricuspid valve, CAD, MI, remote history of atrial fibrillation with RVR presenting to the ED from hemodialysis center with altered mental status. Concerns for ischemic event.  Initial CT head reviewed and shows age indeterminate left thalamic lacunar infarct new since November otherwise no acute cortically based infarct or acute intracranial hemorrhage identified. Partial tPA dose given and stopped. RN cleared therapy to work with pt.   OT comments  Pt seen for OT treatment this date. Upon arrival to room pt awake/alert seated upright in bed. Agreeable to tx. Pt endorsed UE function is improving, and feels her arms are getting stronger since time of admission. Pt provided with education on desensitization strategies for management of residual limb. Pt return demonstrated techniques and instruction provided. Pt eager to learn more about prosthetic training and endorsed wanting to be able to stand again on this date. OT provided education within scope and knowledge base and also encouraged pt to discuss her goals with STR therapists as pt has DC order in place. OT informed PT of pt stated goals as well. Pt making good progress and continues to benefit from skilled OT services to maximize return to PLOF and minimize risk of future falls, injury, caregiver burden, and readmission. Will continue to follow POC. Discharge recommendation remains appropriate.    Follow Up Recommendations  SNF(Resume STR)    Equipment Recommendations  (TBD)    Recommendations for Other Services      Precautions / Restrictions Precautions Precautions: Fall Restrictions Weight Bearing  Restrictions: Yes RLE Weight Bearing: Non weight bearing LLE Weight Bearing: Non weight bearing Other Position/Activity Restrictions: bilat BKA       Mobility Bed Mobility                  Transfers                      Balance Overall balance assessment: Needs assistance Sitting-balance support: No upper extremity supported;Feet unsupported Sitting balance-Leahy Scale: Fair                                     ADL either performed or assessed with clinical judgement   ADL Overall ADL's : Needs assistance/impaired Eating/Feeding: Set up;Sitting Eating/Feeding Details (indicate cue type and reason): Pt endorsed improved ability to manage meal tray items on this date. Feels her UE function is improving.                                          Vision Patient Visual Report: No change from baseline     Perception     Praxis      Cognition Arousal/Alertness: Awake/alert Behavior During Therapy: WFL for tasks assessed/performed Overall Cognitive Status: Within Functional Limits for tasks assessed                                 General Comments: Pt had difficulty with word finding at times, but otherwise was able to attend to conversation  and OT tx on this date.         Exercises Other Exercises Other Exercises: Pt educated in residual limb mgt strategies including gentle tapping and circular massage for mgt of phantom limb pain.    Shoulder Instructions       General Comments Bandage in place over L BKA. Pt with many questions about prosthetics/standing. OT provided infomration within scope/knowledge base and encouraged pt to discuss goals for standing/walking again with STR therapists since this is a personal goal for rehabilitation overall.     Pertinent Vitals/ Pain       Pain Assessment: No/denies pain  Home Living                                          Prior  Functioning/Environment              Frequency  Min 2X/week        Progress Toward Goals  OT Goals(current goals can now be found in the care plan section)  Progress towards OT goals: Progressing toward goals  Acute Rehab OT Goals Patient Stated Goal: go back to rehab and keep getting stronger OT Goal Formulation: With patient Time For Goal Achievement: 07/23/18 Potential to Achieve Goals: Good  Plan Discharge plan remains appropriate;Frequency remains appropriate    Co-evaluation                 AM-PAC OT "6 Clicks" Daily Activity     Outcome Measure   Help from another person eating meals?: A Little Help from another person taking care of personal grooming?: A Little Help from another person toileting, which includes using toliet, bedpan, or urinal?: A Lot Help from another person bathing (including washing, rinsing, drying)?: A Little Help from another person to put on and taking off regular upper body clothing?: A Little Help from another person to put on and taking off regular lower body clothing?: A Little 6 Click Score: 17    End of Session    OT Visit Diagnosis: Other abnormalities of gait and mobility (R26.89);Muscle weakness (generalized) (M62.81);Other symptoms and signs involving cognitive function   Activity Tolerance Patient tolerated treatment well   Patient Left in bed;with call bell/phone within reach;with bed alarm set   Nurse Communication          Time: 9323-5573 OT Time Calculation (min): 20 min  Charges: OT General Charges $OT Visit: 1 Visit OT Treatments $Self Care/Home Management : 8-22 mins  Shara Blazing, M.S., OTR/L Ascom: [phone] 07/13/18, 4:12 PM

## 2018-10-19 ENCOUNTER — Other Ambulatory Visit (INDEPENDENT_AMBULATORY_CARE_PROVIDER_SITE_OTHER): Payer: Self-pay | Admitting: Vascular Surgery

## 2018-10-19 DIAGNOSIS — N186 End stage renal disease: Secondary | ICD-10-CM

## 2018-10-21 ENCOUNTER — Ambulatory Visit (INDEPENDENT_AMBULATORY_CARE_PROVIDER_SITE_OTHER): Payer: Medicare Other

## 2018-10-21 ENCOUNTER — Other Ambulatory Visit (INDEPENDENT_AMBULATORY_CARE_PROVIDER_SITE_OTHER): Payer: Self-pay | Admitting: Nurse Practitioner

## 2018-10-21 ENCOUNTER — Encounter (INDEPENDENT_AMBULATORY_CARE_PROVIDER_SITE_OTHER): Payer: Self-pay | Admitting: Nurse Practitioner

## 2018-10-21 ENCOUNTER — Ambulatory Visit (INDEPENDENT_AMBULATORY_CARE_PROVIDER_SITE_OTHER): Payer: Medicare Other | Admitting: Nurse Practitioner

## 2018-10-21 ENCOUNTER — Other Ambulatory Visit: Payer: Self-pay

## 2018-10-21 VITALS — BP 110/58 | HR 58 | Resp 16

## 2018-10-21 DIAGNOSIS — E785 Hyperlipidemia, unspecified: Secondary | ICD-10-CM

## 2018-10-21 DIAGNOSIS — Z992 Dependence on renal dialysis: Secondary | ICD-10-CM

## 2018-10-21 DIAGNOSIS — N186 End stage renal disease: Secondary | ICD-10-CM

## 2018-10-21 DIAGNOSIS — E1122 Type 2 diabetes mellitus with diabetic chronic kidney disease: Secondary | ICD-10-CM | POA: Diagnosis not present

## 2018-10-21 DIAGNOSIS — E1169 Type 2 diabetes mellitus with other specified complication: Secondary | ICD-10-CM

## 2018-10-21 DIAGNOSIS — Z79899 Other long term (current) drug therapy: Secondary | ICD-10-CM

## 2018-10-26 ENCOUNTER — Telehealth (INDEPENDENT_AMBULATORY_CARE_PROVIDER_SITE_OTHER): Payer: Self-pay

## 2018-10-26 NOTE — Telephone Encounter (Signed)
I attempted to contact the patient to give her the information about her appt with Dr. Clayborn Bigness at [facility] on 10/29/2018 at 2:00 pm.

## 2018-10-27 NOTE — Telephone Encounter (Signed)
I attempted to contact the patient and give her information regarding and upcoming appt with Dr. Clayborn Bigness. A message was left for a return call.

## 2018-10-28 ENCOUNTER — Encounter (INDEPENDENT_AMBULATORY_CARE_PROVIDER_SITE_OTHER): Payer: Self-pay | Admitting: Nurse Practitioner

## 2018-10-28 NOTE — Progress Notes (Signed)
SUBJECTIVE:  Patient ID: Stephanie Ellis, female    DOB: 06/12/1950, 68 y.o.   MRN: [id number] Chief Complaint  Patient presents with  . Follow-up    ref [facility] eval for HDA    HPI  Stephanie Ellis is a 68 y.o. female that presents today for upper extremity vein mapping due to multiple upper extremity failed accesses.  The patient more recently had her left arm loop graft ligated for steal.  She is maintained by left chest perm cath.  She has failed Peritoneal dialysis previously.  She refused to allow [place] to examine the right upper extremity.  Ultrasound shows the possibility for a brachial axillary graft.  Patient denies fever, chills, nausea and vomiting.    Past Medical History:  Diagnosis Date  . (HFpEF) heart failure with preserved ejection fraction ([place])    a. 01/2018 Echo: EF 50-55%, no rwma, Gr1 DD, triv AI, mild MR. Midly dil LA/RV, mod reduced RV fxn. Irreg thickening of TV w/ mobile echodensity that appears to arise from valve.  . Anemia   . Anorexia   . Bacteremia due to Pseudomonas   . Carotid arterial disease ([place])    a. 01/2017 Carotid U/S: RICA <84, LICA <16.  Marland Kitchen Complication of anesthesia    a. Pt reports h/o complication on 9 different occasions - ? hypotension/arrest.  . 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 01/2015 Cath [facility]): 70% LM, p-mLAD 50-60% (Resting FFR 0.75), mRCA 80-90%, ~40 Ost OM & D1-->CABG; b. 01/2016 Staged PCI of LCX x 2 and RCA.  Marland Kitchen ESRD (end stage renal disease) on dialysis ([facility])    a. ESRD secondary to acute kidney failure s/p CABG-->PD  . Essential hypertension   . GERD (gastroesophageal reflux disease)   . Heart murmur   . Hypercholesterolemia   . Myocardial infarction ([place]) 2016  . PVD (peripheral vascular disease) ([place])    a. 01/30/2018 PV Angio: Sev Left Popliteal dzs s/p PTA w/ 4m lutonix DEB w/ mech aspiration of the L popliteal, L AT, and Left tibioperoneal trunck and peroneal artery.  . S/P CABG x 3  03/24/2015   a.  [facility]: Dr. BMarland KitchenHaithcock: CABG x 3, LIMA to LAD, SVG to RCA, SVG to OM3, EVH  . Sinusitis 2019  . Type II diabetes mellitus with complication (HCC)    CAD    Past Surgical History:  Procedure Laterality Date  . ABDOMINAL HYSTERECTOMY    . AMPUTATION Right 02/25/2018   Procedure: AMPUTATION BELOW KNEE;  Surgeon: SKatha Cabal MD;  Location: [facility];  Service: Vascular;  Laterality: Right;  . AMPUTATION Left 03/11/2018   Procedure: AMPUTATION BELOW KNEE;  Surgeon: DAlgernon Huxley MD;  Location: [facility];  Service: Vascular;  Laterality: Left;  . AMPUTATION Bilateral 04/13/2018   Procedure: REVISION OF BILATERAL AMPUTATIONS BELOW KNEE WITH WOUND VAC APPLICATIONS;  Surgeon: EEvaristo Bury MD;  Location: [facility];  Service: Vascular;  Laterality: Bilateral;  . AMPUTATION TOE Left 02/06/2018   Procedure: AMPUTATION GREAT TOE;  Surgeon: FSamara Deist DPM;  Location: [facility];  Service: Podiatry;  Laterality: Left;  . APPLICATION OF WOUND VAC Left 04/13/2018   Procedure: APPLICATION OF WOUND VAC;  Surgeon: EEvaristo Bury MD;  Location: [facility];  Service: Vascular;  Laterality: Left;  . ARTERIOVENOUS GRAFT PLACEMENT  05/2016  . AV FISTULA PLACEMENT Left 05/30/2016   Procedure: ARTERIOVENOUS graft;  Surgeon: JAlgernon Huxley MD;  Location: [facility];  Service: Vascular;  Laterality: Left;  . CAPD INSERTION N/A 10/30/2017   Procedure: LAPAROSCOPIC INSERTION CONTINUOUS AMBULATORY PERITONEAL DIALYSIS  (CAPD) CATHETER;  Surgeon: Algernon Huxley, MD;  Location: [facility];  Service: Vascular;  Laterality: N/A;  . CARDIAC CATHETERIZATION  01/2015   [facility]: Ost LM 70%, p-m LAD 50-60% (Rest FFR + @ 0.75), mRCA 80-90%, ostD1 40%, pOM1 40%  . CATARACT EXTRACTION W/PHACO Left 01/18/2016   Procedure: CATARACT EXTRACTION PHACO AND INTRAOCULAR LENS PLACEMENT (IOC);  Surgeon: Eulogio Bear, MD;  Location: [facility];  Service: Ophthalmology;  Laterality: Left;  [place] 1.05 AP% 15.5 CDE 10.16  Fluid Pack Lot # [id number]  . CATARACT EXTRACTION W/PHACO Right 08/01/2016   Procedure: CATARACT EXTRACTION PHACO AND INTRAOCULAR LENS PLACEMENT (IOC);  Surgeon: Eulogio Bear, MD;  Location: [facility];  Service: Ophthalmology;  Laterality: Right;  [place] 01:00.6 AP% 11.4 CDE 6.93  LOT # [id number]  . COLONOSCOPY    . CORONARY ANGIOPLASTY     SENTS 02/12/16  . CORONARY ARTERY BYPASS GRAFT  03/28/15    [facility]: Dr. Waldemar Dickens: LIMA to LAD, SVG to RCA, SVG to OM3, EVH  . DIALYSIS/PERMA CATHETER INSERTION N/A 02/11/2018   Procedure: DIALYSIS/PERMA CATHETER INSERTION;  Surgeon: Algernon Huxley, MD;  Location: [place] [facility];  Service: Cardiovascular;  Laterality: N/A;  . DIALYSIS/PERMA CATHETER REMOVAL Right 02/04/2018   Procedure: DIALYSIS/PERMA CATHETER REMOVAL;  Surgeon: Katha Cabal, MD;  Location: [place] [facility];  Service: Cardiovascular;  Laterality: Right;  . ESOPHAGOGASTRODUODENOSCOPY (EGD) WITH PROPOFOL N/A 10/24/2016   Procedure: ESOPHAGOGASTRODUODENOSCOPY (EGD) WITH PROPOFOL;  Surgeon: [name], MD;  Location: [facility];  Service: Endoscopy;  Laterality: N/A;  . EYE SURGERY Bilateral    cataract surgery  . EYE SURGERY     drains for glaucoma  . INSERTION EXPRESS TUBE SHUNT Right 08/01/2016   Procedure: INSERTION EXPRESS TUBE SHUNT;  Surgeon: Eulogio Bear, MD;  Location: [facility];  Service: Ophthalmology;  Laterality: Right;  . INSERTION OF AHMED VALVE Left 08/15/2016   Procedure: INSERTION OF AHMED VALVE;  Surgeon: Eulogio Bear, MD;  Location: [facility];  Service: Ophthalmology;  Laterality: Left;  . INSERTION OF DIALYSIS CATHETER    . LOWER EXTREMITY ANGIOGRAPHY Right 02/19/2018   Procedure: Lower Extremity Angiography;  Surgeon: Algernon Huxley, MD;  Location: [place] [facility];  Service: Cardiovascular;  Laterality: Right;  . LOWER EXTREMITY ANGIOGRAPHY Left 01/30/2018   Procedure: Lower Extremity Angiography;  Surgeon: Katha Cabal, MD;  Location:  [place] [facility];  Service: Cardiovascular;  Laterality: Left;  . REMOVAL OF A DIALYSIS CATHETER N/A 02/25/2018   Procedure: REMOVAL OF A DIALYSIS CATHETER;  Surgeon: Katha Cabal, MD;  Location: [facility];  Service: Vascular;  Laterality: N/A;  . TEMPORARY DIALYSIS CATHETER N/A 02/06/2018   Procedure: TEMPORARY DIALYSIS CATHETER;  Surgeon: Katha Cabal, MD;  Location: [place] [facility];  Service: Cardiovascular;  Laterality: N/A;  . TRANSTHORACIC ECHOCARDIOGRAM  January 2017    EF 60-65%. GR 2 DD. Mild degenerative mitral valve disease but no prolapse or regurgitation. Mild left atrial dilation. Mild to moderate LVH. Pericardial effusion gone  . UPPER EXTREMITY ANGIOGRAPHY Left 06/27/2016   Procedure: Upper Extremity Angiography;  Surgeon: Algernon Huxley, MD;  Location: [place] [facility];  Service: Cardiovascular;  Laterality: Left;  . UPPER EXTREMITY ANGIOGRAPHY Left 08/22/2016   Procedure: Upper Extremity Angiography;  Surgeon: Algernon Huxley, MD;  Location: [place] [facility];  Service: Cardiovascular;  Laterality:  Left;  . VASCULAR SURGERY    . WOUND DEBRIDEMENT Left 05/08/2018   Procedure: DEBRIDEMENT OF LEFT LEG BKA;  Surgeon: Algernon Huxley, MD;  Location: [facility];  Service: General;  Laterality: Left;    Social History   Socioeconomic History  . Marital status: Single    Spouse name: Not on file  . Number of children: Not on file  . Years of education: Not on file  . Highest education level: Not on file  Occupational History  . Not on file  Social Needs  . Financial resource strain: Not on file  . Food insecurity    Worry: Not on file    Inability: Not on file  . Transportation needs    Medical: Not on file    Non-medical: Not on file  Tobacco Use  . Smoking status: Never Smoker  . Smokeless tobacco: Never Used  Substance and Sexual Activity  . Alcohol use: No  . Drug use: No  . Sexual activity: Never  Lifestyle  . Physical activity    Days per week: Not  on file    Minutes per session: Not on file  . Stress: Not on file  Relationships  . Social Herbalist on phone: Not on file    Gets together: Not on file    Attends religious service: Not on file    Active member of club or organization: Not on file    Attends meetings of clubs or organizations: Not on file    Relationship status: Not on file  . Intimate partner violence    Fear of current or ex partner: Not on file    Emotionally abused: Not on file    Physically abused: Not on file    Forced sexual activity: Not on file  Other Topics Concern  . Not on file  Social History Narrative   Lives in [place] with family.    Family History  Problem Relation Age of Onset  . Diabetes Mellitus II Mother   . Heart failure Mother   . Pancreatic cancer Father     Allergies  Allergen Reactions  . Chlorthalidone Anaphylaxis, Itching and Rash  . Fentanyl Rash  . Midazolam Rash  . Ace Inhibitors Other (See Comments)    Reaction:  Hyperkalemia, agitation   . Angiotensin Receptor Blockers Other (See Comments)    Reaction:  Hyperkalemia, agitation   . Norvasc [Amlodipine] Itching and Rash  . Phenergan [Promethazine Hcl] Anxiety    "antsy, can't sit still"     Review of Systems   Review of Systems: Negative Unless Checked Constitutional: [] Weight loss  [] Fever  [] Chills Cardiac: [] Chest pain   []  Atrial Fibrillation  [] Palpitations   [] Shortness of breath when laying flat   [x] Shortness of breath with exertion. [] Shortness of breath at rest Vascular:  [] Pain in legs with walking   [] Pain in legs with standing [] Pain in legs when laying flat   [] Claudication    [] Pain in feet when laying flat    [] History of DVT   [] Phlebitis   [] Swelling in legs   [] Varicose veins   [] Non-healing ulcers Pulmonary:   [] Uses home oxygen   [] Productive cough   [] Hemoptysis   [] Wheeze  [] COPD   [] Asthma Neurologic:  [] Dizziness   [] Seizures  [] Blackouts [] History of stroke   [] History of TIA   [] Aphasia   [] Temporary Blindness   [] Weakness or numbness in arm   [] Weakness or numbness in leg Musculoskeletal:   []   Joint swelling   [] Joint pain   [] Low back pain  []  History of Knee Replacement [] Arthritis [] back Surgeries  []  Spinal Stenosis    Hematologic:  [] Easy bruising  [] Easy bleeding   [] Hypercoagulable state   [x] Anemic Gastrointestinal:  [] Diarrhea   [] Vomiting  [x] Gastroesophageal reflux/heartburn   [] Difficulty swallowing. [] Abdominal pain Genitourinary:  [x] Chronic kidney disease   [] Difficult urination  [] Anuric   [] Blood in urine [] Frequent urination  [] Burning with urination   [] Hematuria Skin:  [] Rashes   [] Ulcers [] Wounds Psychological:  [] History of anxiety   []  History of major depression  []  Memory Difficulties      OBJECTIVE:   Physical Exam  BP (!) 110/58 (BP Location: Left Arm)   Pulse (!) 58   Resp 16   Gen: WD/WN, NAD Head: [place]/AT, No temporalis wasting.  Ear/Nose/Throat: Hearing grossly intact, nares w/o erythema or drainage Eyes: PER, EOMI, sclera nonicteric.  Neck: Supple, no masses.  No JVD.  Pulmonary:  Good air movement, no use of accessory muscles.  Cardiac: RRR Vascular:  Vessel Right Left  Radial Palpable Palpable   Gastrointestinal: soft, non-distended. No guarding/no peritoneal signs.  Musculoskeletal: M/S 5/5 throughout.  Bilateral BKA  Neurologic: Pain and light touch intact in extremities.  Symmetrical.  Speech is fluent. Motor exam as listed above. Psychiatric: Judgment intact, Mood & affect appropriate for pt's clinical situation. Dermatologic: No Venous rashes. No Ulcers Noted.  No changes consistent with cellulitis. Lymph : No Cervical lymphadenopathy, no lichenification or skin changes of chronic lymphedema.       ASSESSMENT AND PLAN:  1. ESRD (end stage renal disease) on dialysis [facility]) We were only able to examine the patient's left arm, as she refused to allow [place] to do the right arm.  She has had a previous failed loop graft  that had to be ligate for steal syndrome.  Although she has adequate veins for creation of a brachial axillary graft in the left, it is unlikely it would succeed due to the steal in that left arm.    At this time the patient's options for access are to allow [place] to place access in her right or to become catheter dependent.    2. Hyperlipidemia, unspecified hyperlipidemia type Continue statin as ordered and reviewed, no changes at this time   3. Type 2 diabetes mellitus with other specified complication, unspecified whether long term insulin use (HCC) Continue hypoglycemic medications as already ordered, these medications have been reviewed and there are no changes at this time.  Hgb A1C to be monitored as already arranged by primary service    Current Outpatient Medications on File Prior to Visit  Medication Sig Dispense Refill  . apixaban (ELIQUIS) 2.5 MG TABS tablet Take 1 tablet (2.5 mg total) by mouth 2 (two) times daily. 60 tablet 0  . aspirin EC 81 MG tablet Take 1 tablet (81 mg total) by mouth daily.    Marland Kitchen atorvastatin (LIPITOR) 80 MG tablet Take 80 mg by mouth at bedtime.     Marland Kitchen BETIMOL 0.5 % ophthalmic solution     . brimonidine (ALPHAGAN) 0.2 % ophthalmic solution Place 1 drop into both eyes 3 (three) times daily. 5 mL 12  . carvedilol (COREG) 25 MG tablet Take 1 tablet (25 mg total) by mouth 2 (two) times daily. 60 tablet 0  . docusate sodium (COLACE) 100 MG capsule Take 1 capsule (100 mg total) by mouth 2 (two) times daily. 10 capsule 0  . famotidine (PEPCID) 20 MG tablet  Take 20 mg by mouth 2 (two) times daily.    . hydrALAZINE (APRESOLINE) 25 MG tablet Take 1 tablet (25 mg total) by mouth 3 (three) times daily for 30 days. 90 tablet 0  . insulin glargine (LANTUS) 100 UNIT/ML injection Inject 2 Units into the skin at bedtime.    . isosorbide mononitrate (IMDUR) 30 MG 24 hr tablet Take 30 mg by mouth daily.     Marland Kitchen levETIRAcetam (KEPPRA) 250 MG tablet Three times weekly after  dialysis (Patient taking differently: Take 250 mg by mouth See admin instructions. Take 1 tablet (213m) by mouth every Tuesday, Thursday and Saturday afternoon)    . levETIRAcetam (KEPPRA) 500 MG tablet Take 1 tablet (500 mg total) by mouth 2 (two) times daily.    . mirtazapine (REMERON) 7.5 MG tablet Take 7.5 mg by mouth daily.     . nitroGLYCERIN (NITROSTAT) 0.4 MG SL tablet Place 1 tablet (0.4 mg total) under the tongue every 5 (five) minutes as needed for chest pain. 30 tablet 0  . Nutritional Supplements (FEEDING SUPPLEMENT, NEPRO CARB STEADY,) LIQD Take 237 mLs by mouth 2 (two) times daily between meals. 60 Can 0  . Omega-3 1000 MG CAPS Take 2 capsules by mouth daily.     . ondansetron (ZOFRAN) 4 MG tablet Take 4 mg by mouth every 8 (eight) hours as needed for nausea or vomiting.    . Polyvinyl Alcohol-Povidone PF (REFRESH) 1.4-0.6 % SOLN Apply to eye 2 (two) times a day.    .Marland KitchenrOPINIRole (REQUIP) 0.25 MG tablet Take 1 tablet (0.25 mg total) by mouth at bedtime.    . sevelamer carbonate (RENVELA) 800 MG tablet Take 1,600 mg by mouth 3 (three) times daily with meals.     . traMADol (ULTRAM) 50 MG tablet     . vitamin B-12 (CYANOCOBALAMIN) 1000 MCG tablet Take 1 tablet (1,000 mcg total) by mouth daily. 30 tablet 0  . acetaminophen (TYLENOL) 325 MG tablet Take 2 tablets (650 mg total) by mouth every 6 (six) hours as needed for mild pain (or Fever >/= 101). (Patient not taking: Reported on 10/21/2018)    . epoetin alfa (EPOGEN,PROCRIT) 163846UNIT/ML injection Inject 1 mL (10,000 Units total) into the vein Every Tuesday,Thursday,and Saturday with dialysis. (Patient not taking: Reported on 76/08/9933 1 mL   . folic acid (FOLVITE) 1 MG tablet Take 1 tablet (1 mg total) by mouth daily. (Patient not taking: Reported on 10/21/2018) 30 tablet 3  . multivitamin (RENA-VIT) TABS tablet Take 1 tablet by mouth at bedtime. (Patient not taking: Reported on 10/21/2018) 30 tablet 0  . Polyethyl Glycol-Propyl Glycol  0.4-0.3 % SOLN Place 1 drop into both eyes 2 (two) times daily.    . timolol (TIMOPTIC) 0.5 % ophthalmic solution Place 1 drop into both eyes 3 (three) times daily. (Patient not taking: Reported on 10/21/2018) 10 mL 12  . vitamin C (VITAMIN C) 500 MG tablet Take 1 tablet (500 mg total) by mouth 2 (two) times daily. (Patient not taking: Reported on 10/21/2018) 60 tablet 0   No current facility-administered medications on file prior to visit.     There are no Patient Instructions on file for this visit. No follow-ups on file.   FKris Hartmann NP  This note was completed with DSales executive  Any errors are purely unintentional.

## 2018-10-28 NOTE — Telephone Encounter (Signed)
I contacted the patient's dialysis center and was told the patient resides at [facility] and her dialysis days have changed as well. Per Eulogio Ditch the patient needs another u/s before surgery can be discussed. The patient's appt with Dr. Clayborn Bigness has been canceled until further notice.

## 2018-11-18 ENCOUNTER — Telehealth (INDEPENDENT_AMBULATORY_CARE_PROVIDER_SITE_OTHER): Payer: Self-pay | Admitting: Vascular Surgery

## 2018-11-18 NOTE — Telephone Encounter (Signed)
Spoke with Miguel Aschoff said it was fine to refer patient to [facility] for prosthetics. Spoke with Silva Bandy from [employer] and advised that referral form has been faxed to [employer]. AS, CMA

## 2018-12-16 ENCOUNTER — Other Ambulatory Visit (INDEPENDENT_AMBULATORY_CARE_PROVIDER_SITE_OTHER): Payer: Self-pay | Admitting: Nurse Practitioner

## 2018-12-16 ENCOUNTER — Telehealth (INDEPENDENT_AMBULATORY_CARE_PROVIDER_SITE_OTHER): Payer: Self-pay

## 2018-12-16 NOTE — Telephone Encounter (Signed)
That is fine we can send script

## 2018-12-16 NOTE — Telephone Encounter (Signed)
I have faxed over the script to [facility]

## 2018-12-17 ENCOUNTER — Other Ambulatory Visit (INDEPENDENT_AMBULATORY_CARE_PROVIDER_SITE_OTHER): Payer: Self-pay | Admitting: Nurse Practitioner

## 2018-12-17 DIAGNOSIS — N186 End stage renal disease: Secondary | ICD-10-CM

## 2018-12-18 ENCOUNTER — Ambulatory Visit (INDEPENDENT_AMBULATORY_CARE_PROVIDER_SITE_OTHER): Payer: Medicare Other | Admitting: Nurse Practitioner

## 2018-12-18 ENCOUNTER — Encounter (INDEPENDENT_AMBULATORY_CARE_PROVIDER_SITE_OTHER): Payer: Self-pay | Admitting: Nurse Practitioner

## 2018-12-18 ENCOUNTER — Other Ambulatory Visit: Payer: Self-pay

## 2018-12-18 ENCOUNTER — Ambulatory Visit (INDEPENDENT_AMBULATORY_CARE_PROVIDER_SITE_OTHER): Payer: Medicare Other

## 2018-12-18 ENCOUNTER — Encounter (INDEPENDENT_AMBULATORY_CARE_PROVIDER_SITE_OTHER): Payer: Self-pay

## 2018-12-18 VITALS — BP 162/69 | HR 55 | Resp 16

## 2018-12-18 DIAGNOSIS — E1169 Type 2 diabetes mellitus with other specified complication: Secondary | ICD-10-CM | POA: Diagnosis not present

## 2018-12-18 DIAGNOSIS — N186 End stage renal disease: Secondary | ICD-10-CM

## 2018-12-18 DIAGNOSIS — Z89512 Acquired absence of left leg below knee: Secondary | ICD-10-CM

## 2018-12-18 DIAGNOSIS — Z992 Dependence on renal dialysis: Secondary | ICD-10-CM

## 2018-12-18 DIAGNOSIS — E785 Hyperlipidemia, unspecified: Secondary | ICD-10-CM

## 2018-12-18 DIAGNOSIS — Z89511 Acquired absence of right leg below knee: Secondary | ICD-10-CM

## 2018-12-20 ENCOUNTER — Encounter (INDEPENDENT_AMBULATORY_CARE_PROVIDER_SITE_OTHER): Payer: Self-pay | Admitting: Nurse Practitioner

## 2018-12-20 NOTE — Progress Notes (Signed)
SUBJECTIVE:  Patient ID: Stephanie Ellis, female    DOB: 17-May-1950, 68 y.o.   MRN: [id number] Chief Complaint  Patient presents with  . Follow-up    HPI  Stephanie Ellis is a 68 y.o. female  The patient is seen for evaluation of dialysis access.  The patient has a history of multiple failed accesses.  The patient has had issues with accesses in her left upper extremity which subsequently resulted in steal syndrome.  Current access is via a catheter which is functioning poorly.  There have been several episodes of catheter infection.  The patient denies amaurosis fugax or recent TIA symptoms. There are no recent neurological changes noted. The patient denies claudication symptoms or rest pain symptoms. The patient denies history of DVT, PE or superficial thrombophlebitis. The patient denies recent episodes of angina or shortness of breath.   Today noninvasive studies show adequate access for creation of a right brachial axillary graft.  Past Medical History:  Diagnosis Date  . (HFpEF) heart failure with preserved ejection fraction ([place])    a. 01/2018 Echo: EF 50-55%, no rwma, Gr1 DD, triv AI, mild MR. Midly dil LA/RV, mod reduced RV fxn. Irreg thickening of TV w/ mobile echodensity that appears to arise from valve.  . Anemia   . Anorexia   . Bacteremia due to Pseudomonas   . Carotid arterial disease ([place])    a. 01/2017 Carotid U/S: RICA <95, LICA <28.  Marland Kitchen Complication of anesthesia    a. Pt reports h/o complication on 9 different occasions - ? hypotension/arrest.  . 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 01/2015 Cath [facility]): 70% LM, p-mLAD 50-60% (Resting FFR 0.75), mRCA 80-90%, ~40 Ost OM & D1-->CABG; b. 01/2016 Staged PCI of LCX x 2 and RCA.  Marland Kitchen ESRD (end stage renal disease) on dialysis ([place])    a. ESRD secondary to acute kidney failure s/p CABG-->PD  . Essential hypertension   . GERD (gastroesophageal reflux disease)   . Heart murmur   .  Hypercholesterolemia   . Myocardial infarction ([place]) 2016  . PVD (peripheral vascular disease) ([place])    a. 01/30/2018 PV Angio: Sev Left Popliteal dzs s/p PTA w/ 84m lutonix DEB w/ mech aspiration of the L popliteal, L AT, and Left tibioperoneal trunck and peroneal artery.  . 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  [facility]: Dr. BMarland KitchenHaithcock: CABG x 3, LIMA to LAD, SVG to RCA, SVG to OM3, EVH  . Sinusitis 2019  . Type II diabetes mellitus with complication (HCC)    CAD    Past Surgical History:  Procedure Laterality Date  . ABDOMINAL HYSTERECTOMY    . AMPUTATION Right 02/25/2018   Procedure: AMPUTATION BELOW KNEE;  Surgeon: SKatha Cabal MD;  Location: [facility] ORS;  Service: Vascular;  Laterality: Right;  . AMPUTATION Left 03/11/2018   Procedure: AMPUTATION BELOW KNEE;  Surgeon: DAlgernon Huxley MD;  Location: [facility] OR[facility];  Service: Vascular;  Laterality: Left;  . AMPUTATION Bilateral 04/13/2018   Procedure: REVISION OF BILATERAL AMPUTATIONS BELOW KNEE WITH WOUND VAC APPLICATIONS;  Surgeon: EEvaristo Bury MD;  Location: [facility];  Service: Vascular;  Laterality: Bilateral;  . AMPUTATION TOE Left 02/06/2018   Procedure: AMPUTATION GREAT TOE;  Surgeon: FSamara Deist DPM;  Location: [facility] OR[facility];  Service: Podiatry;  Laterality: Left;  . APPLICATION OF WOUND VAC Left 04/13/2018   Procedure: APPLICATION OF WOUND VAC;  Surgeon: EEvaristo Bury MD;  Location: [facility];  Service: Vascular;  Laterality: Left;  . ARTERIOVENOUS GRAFT PLACEMENT  05/2016  . AV FISTULA PLACEMENT Left 05/30/2016   Procedure: ARTERIOVENOUS graft;  Surgeon: Algernon Huxley, MD;  Location: [facility];  Service: Vascular;  Laterality: Left;  . CAPD INSERTION N/A 10/30/2017   Procedure: LAPAROSCOPIC INSERTION CONTINUOUS AMBULATORY PERITONEAL DIALYSIS  (CAPD) CATHETER;  Surgeon: Algernon Huxley, MD;  Location: [facility];  Service: Vascular;  Laterality: N/A;  . CARDIAC CATHETERIZATION  01/2015   [facility]: Ost LM 70%, p-m LAD 50-60% (Rest FFR + @  0.75), mRCA 80-90%, ostD1 40%, pOM1 40%  . CATARACT EXTRACTION W/PHACO Left 01/18/2016   Procedure: CATARACT EXTRACTION PHACO AND INTRAOCULAR LENS PLACEMENT (IOC);  Surgeon: Eulogio Bear, MD;  Location: [facility];  Service: Ophthalmology;  Laterality: Left;  [place] 1.05 AP% 15.5 CDE 10.16 Fluid Pack Lot # [id number]  . CATARACT EXTRACTION W/PHACO Right 08/01/2016   Procedure: CATARACT EXTRACTION PHACO AND INTRAOCULAR LENS PLACEMENT (IOC);  Surgeon: Eulogio Bear, MD;  Location: [facility];  Service: Ophthalmology;  Laterality: Right;  [place] 01:00.6 AP% 11.4 CDE 6.93  LOT # [id number]  . COLONOSCOPY    . CORONARY ANGIOPLASTY     SENTS 02/12/16  . CORONARY ARTERY BYPASS GRAFT  03/28/15    [facility]: Dr. Waldemar Dickens: LIMA to LAD, SVG to RCA, SVG to OM3, EVH  . DIALYSIS/PERMA CATHETER INSERTION N/A 02/11/2018   Procedure: DIALYSIS/PERMA CATHETER INSERTION;  Surgeon: Algernon Huxley, MD;  Location: [place] [facility];  Service: Cardiovascular;  Laterality: N/A;  . DIALYSIS/PERMA CATHETER REMOVAL Right 02/04/2018   Procedure: DIALYSIS/PERMA CATHETER REMOVAL;  Surgeon: Katha Cabal, MD;  Location: [place] [facility];  Service: Cardiovascular;  Laterality: Right;  . ESOPHAGOGASTRODUODENOSCOPY (EGD) WITH PROPOFOL N/A 10/24/2016   Procedure: ESOPHAGOGASTRODUODENOSCOPY (EGD) WITH PROPOFOL;  Surgeon: [name], MD;  Location: [facility];  Service: Endoscopy;  Laterality: N/A;  . EYE SURGERY Bilateral    cataract surgery  . EYE SURGERY     drains for glaucoma  . INSERTION EXPRESS TUBE SHUNT Right 08/01/2016   Procedure: INSERTION EXPRESS TUBE SHUNT;  Surgeon: Eulogio Bear, MD;  Location: [facility];  Service: Ophthalmology;  Laterality: Right;  . INSERTION OF AHMED VALVE Left 08/15/2016   Procedure: INSERTION OF AHMED VALVE;  Surgeon: Eulogio Bear, MD;  Location: [facility];  Service: Ophthalmology;  Laterality: Left;  . INSERTION OF DIALYSIS CATHETER    . LOWER EXTREMITY ANGIOGRAPHY Right  02/19/2018   Procedure: Lower Extremity Angiography;  Surgeon: Algernon Huxley, MD;  Location: [place] [facility];  Service: Cardiovascular;  Laterality: Right;  . LOWER EXTREMITY ANGIOGRAPHY Left 01/30/2018   Procedure: Lower Extremity Angiography;  Surgeon: Katha Cabal, MD;  Location: [place] [facility];  Service: Cardiovascular;  Laterality: Left;  . REMOVAL OF A DIALYSIS CATHETER N/A 02/25/2018   Procedure: REMOVAL OF A DIALYSIS CATHETER;  Surgeon: Katha Cabal, MD;  Location: [facility];  Service: Vascular;  Laterality: N/A;  . TEMPORARY DIALYSIS CATHETER N/A 02/06/2018   Procedure: TEMPORARY DIALYSIS CATHETER;  Surgeon: Katha Cabal, MD;  Location: [place] [facility];  Service: Cardiovascular;  Laterality: N/A;  . TRANSTHORACIC ECHOCARDIOGRAM  January 2017    EF 60-65%. GR 2 DD. Mild degenerative mitral valve disease but no prolapse or regurgitation. Mild left atrial dilation. Mild to moderate LVH. Pericardial effusion gone  . UPPER EXTREMITY ANGIOGRAPHY Left 06/27/2016   Procedure: Upper Extremity Angiography;  Surgeon: Algernon Huxley, MD;  Location: [place]  CV LAB;  Service: Cardiovascular;  Laterality: Left;  . UPPER EXTREMITY ANGIOGRAPHY Left 08/22/2016   Procedure: Upper Extremity Angiography;  Surgeon: Algernon Huxley, MD;  Location: [place] [facility];  Service: Cardiovascular;  Laterality: Left;  Marland Kitchen VASCULAR SURGERY    . WOUND DEBRIDEMENT Left 05/08/2018   Procedure: DEBRIDEMENT OF LEFT LEG BKA;  Surgeon: Algernon Huxley, MD;  Location: [facility];  Service: General;  Laterality: Left;    Social History   Socioeconomic History  . Marital status: Single    Spouse name: Not on file  . Number of children: Not on file  . Years of education: Not on file  . Highest education level: Not on file  Occupational History  . Not on file  Social Needs  . Financial resource strain: Not on file  . Food insecurity    Worry: Not on file    Inability: Not on file  .  Transportation needs    Medical: Not on file    Non-medical: Not on file  Tobacco Use  . Smoking status: Never Smoker  . Smokeless tobacco: Never Used  Substance and Sexual Activity  . Alcohol use: No  . Drug use: No  . Sexual activity: Never  Lifestyle  . Physical activity    Days per week: Not on file    Minutes per session: Not on file  . Stress: Not on file  Relationships  . Social Herbalist on phone: Not on file    Gets together: Not on file    Attends religious service: Not on file    Active member of club or organization: Not on file    Attends meetings of clubs or organizations: Not on file    Relationship status: Not on file  . Intimate partner violence    Fear of current or ex partner: Not on file    Emotionally abused: Not on file    Physically abused: Not on file    Forced sexual activity: Not on file  Other Topics Concern  . Not on file  Social History Narrative   Lives in [place] with family.    Family History  Problem Relation Age of Onset  . Diabetes Mellitus II Mother   . Heart failure Mother   . Pancreatic cancer Father     Allergies  Allergen Reactions  . Chlorthalidone Anaphylaxis, Itching and Rash  . Fentanyl Rash  . Midazolam Rash  . Ace Inhibitors Other (See Comments)    Reaction:  Hyperkalemia, agitation   . Angiotensin Receptor Blockers Other (See Comments)    Reaction:  Hyperkalemia, agitation   . Norvasc [Amlodipine] Itching and Rash  . Phenergan [Promethazine Hcl] Anxiety    "antsy, can't sit still"     Review of Systems   Review of Systems: Negative Unless Checked Constitutional: [] Weight loss  [] Fever  [] Chills Cardiac: [] Chest pain   []  Atrial Fibrillation  [] Palpitations   [] Shortness of breath when laying flat   [] Shortness of breath with exertion. [] Shortness of breath at rest Vascular:  [] Pain in legs with walking   [] Pain in legs with standing [] Pain in legs when laying flat   [] Claudication    [] Pain in  feet when laying flat    [] History of DVT   [] Phlebitis   [] Swelling in legs   [x] Varicose veins   [] Non-healing ulcers Pulmonary:   [] Uses home oxygen   [] Productive cough   [] Hemoptysis   [] Wheeze  [] COPD   [] Asthma Neurologic:  []   Dizziness   [] Seizures  [] Blackouts [x] History of stroke   [] History of TIA  [] Aphasia   [] Temporary Blindness   [] Weakness or numbness in arm   [x] Weakness or numbness in leg Musculoskeletal:   [] Joint swelling   [] Joint pain   [] Low back pain  []  History of Knee Replacement [] Arthritis [] back Surgeries  []  Spinal Stenosis    Hematologic:  [] Easy bruising  [] Easy bleeding   [] Hypercoagulable state   [x] Anemic Gastrointestinal:  [] Diarrhea   [] Vomiting  [] Gastroesophageal reflux/heartburn   [] Difficulty swallowing. [] Abdominal pain Genitourinary:  [x] Chronic kidney disease   [] Difficult urination  [] Anuric   [] Blood in urine [] Frequent urination  [] Burning with urination   [] Hematuria Skin:  [] Rashes   [] Ulcers [] Wounds Psychological:  [] History of anxiety   []  History of major depression  []  Memory Difficulties      OBJECTIVE:   Physical Exam  BP (!) 162/69 (BP Location: Right Arm)   Pulse (!) 55   Resp 16   Gen: WD/WN, NAD Head: [place]/AT, No temporalis wasting.  Ear/Nose/Throat: Hearing grossly intact, nares w/o erythema or drainage Eyes: PER, EOMI, sclera nonicteric.  Neck: Supple, no masses.  No JVD.  Pulmonary:  Good air movement, no use of accessory muscles.  Cardiac: RRR Vascular:  Vessel Right Left  Radial Palpable Palpable   Gastrointestinal: soft, non-distended. No guarding/no peritoneal signs.  Musculoskeletal: M/S 5/5 throughout.  bilateral BKA Neurologic: Pain and light touch intact in extremities.  Symmetrical.  Speech is fluent. Motor exam as listed above. Psychiatric: Judgment intact, Mood & affect appropriate for pt's clinical situation. Dermatologic: No Venous rashes. No Ulcers Noted.  No changes consistent with cellulitis. Lymph : No  Cervical lymphadenopathy, no lichenification or skin changes of chronic lymphedema.       ASSESSMENT AND PLAN:  1. ESRD (end stage renal disease) on dialysis [facility]) Recommend:  At this time the patient does not have appropriate extremity access for dialysis  Patient should have a right brachial axillary graft created.  The risks, benefits and alternative therapies were reviewed in detail with the patient.  All questions were answered.  The patient is concerned about possible steal in the right upper extremity.  She also does not like the idea of being stuck again for dialysis. We discussed catheter dependence vs graft. The patient wishes to consider her options and will contact us with her decision.    2. S/P bilateral BKA (below knee amputation) ([place]) Given prescription for prosthesis with [employer], per patient request  3. Hyperlipidemia, unspecified hyperlipidemia type Continue statin as ordered and reviewed, no changes at this time   4. Type 2 diabetes mellitus with other specified complication, unspecified whether long term insulin use (HCC) Continue hypoglycemic medications as already ordered, these medications have been reviewed and there are no changes at this time.  Hgb A1C to be monitored as already arranged by primary service    Current Outpatient Medications on File Prior to Visit  Medication Sig Dispense Refill  . acetaminophen (TYLENOL) 325 MG tablet Take 2 tablets (650 mg total) by mouth every 6 (six) hours as needed for mild pain (or Fever >/= 101).    Marland Kitchen apixaban (ELIQUIS) 2.5 MG TABS tablet Take 1 tablet (2.5 mg total) by mouth 2 (two) times daily. 60 tablet 0  . aspirin EC 81 MG tablet Take 1 tablet (81 mg total) by mouth daily.    Marland Kitchen atorvastatin (LIPITOR) 80 MG tablet Take 80 mg by mouth at bedtime.     Marland Kitchen  BETIMOL 0.5 % ophthalmic solution     . brimonidine (ALPHAGAN) 0.2 % ophthalmic solution Place 1 drop into both eyes 3 (three) times daily. 5 mL 12  .  carvedilol (COREG) 25 MG tablet Take 1 tablet (25 mg total) by mouth 2 (two) times daily. 60 tablet 0  . docusate sodium (COLACE) 100 MG capsule Take 1 capsule (100 mg total) by mouth 2 (two) times daily. 10 capsule 0  . famotidine (PEPCID) 20 MG tablet Take 20 mg by mouth 2 (two) times daily.    . hydrALAZINE (APRESOLINE) 25 MG tablet Take 1 tablet (25 mg total) by mouth 3 (three) times daily for 30 days. 90 tablet 0  . insulin glargine (LANTUS) 100 UNIT/ML injection Inject 2 Units into the skin at bedtime.    . isosorbide mononitrate (IMDUR) 30 MG 24 hr tablet Take 30 mg by mouth daily.     Marland Kitchen levETIRAcetam (KEPPRA) 500 MG tablet Take 1 tablet (500 mg total) by mouth 2 (two) times daily.    . mirtazapine (REMERON) 7.5 MG tablet Take 7.5 mg by mouth daily.     . nitroGLYCERIN (NITROSTAT) 0.4 MG SL tablet Place 1 tablet (0.4 mg total) under the tongue every 5 (five) minutes as needed for chest pain. 30 tablet 0  . Nutritional Supplements (FEEDING SUPPLEMENT, NEPRO CARB STEADY,) LIQD Take 237 mLs by mouth 2 (two) times daily between meals. 60 Can 0  . Omega-3 1000 MG CAPS Take 2 capsules by mouth daily.     . ondansetron (ZOFRAN) 4 MG tablet Take 4 mg by mouth every 8 (eight) hours as needed for nausea or vomiting.    Vladimir Faster Glycol-Propyl Glycol 0.4-0.3 % SOLN Place 1 drop into both eyes 2 (two) times daily.    . Polyvinyl Alcohol-Povidone PF (REFRESH) 1.4-0.6 % SOLN Apply to eye 2 (two) times a day.    . sevelamer carbonate (RENVELA) 800 MG tablet Take 1,600 mg by mouth 3 (three) times daily with meals.     . traMADol (ULTRAM) 50 MG tablet     . vitamin B-12 (CYANOCOBALAMIN) 1000 MCG tablet Take 1 tablet (1,000 mcg total) by mouth daily. 30 tablet 0  . vitamin C (VITAMIN C) 500 MG tablet Take 1 tablet (500 mg total) by mouth 2 (two) times daily. 60 tablet 0  . epoetin alfa (EPOGEN,PROCRIT) 87681 UNIT/ML injection Inject 1 mL (10,000 Units total) into the vein Every Tuesday,Thursday,and Saturday  with dialysis. (Patient not taking: Reported on 04/26/7260) 1 mL   . folic acid (FOLVITE) 1 MG tablet Take 1 tablet (1 mg total) by mouth daily. (Patient not taking: Reported on 10/21/2018) 30 tablet 3  . levETIRAcetam (KEPPRA) 250 MG tablet Three times weekly after dialysis (Patient not taking: Reported on 12/18/2018)    . multivitamin (RENA-VIT) TABS tablet Take 1 tablet by mouth at bedtime. (Patient not taking: Reported on 10/21/2018) 30 tablet 0  . rOPINIRole (REQUIP) 0.25 MG tablet Take 1 tablet (0.25 mg total) by mouth at bedtime. (Patient not taking: Reported on 12/18/2018)    . timolol (TIMOPTIC) 0.5 % ophthalmic solution Place 1 drop into both eyes 3 (three) times daily. (Patient not taking: Reported on 10/21/2018) 10 mL 12   No current facility-administered medications on file prior to visit.     There are no Patient Instructions on file for this visit. No follow-ups on file.   Kris Hartmann, NP  This note was completed with Sales executive.  Any errors are purely unintentional.

## 2018-12-29 DIAGNOSIS — Z789 Other specified health status: Secondary | ICD-10-CM | POA: Insufficient documentation

## 2019-01-29 ENCOUNTER — Ambulatory Visit (INDEPENDENT_AMBULATORY_CARE_PROVIDER_SITE_OTHER): Payer: Medicare Other | Admitting: Vascular Surgery

## 2019-04-02 ENCOUNTER — Encounter (INDEPENDENT_AMBULATORY_CARE_PROVIDER_SITE_OTHER): Payer: Self-pay | Admitting: Vascular Surgery

## 2019-04-02 ENCOUNTER — Ambulatory Visit (INDEPENDENT_AMBULATORY_CARE_PROVIDER_SITE_OTHER): Payer: Medicare Other | Admitting: Vascular Surgery

## 2019-04-02 ENCOUNTER — Other Ambulatory Visit: Payer: Self-pay

## 2019-04-02 VITALS — BP 94/51 | HR 66 | Resp 16 | Ht 64.0 in | Wt 118.0 lb

## 2019-04-02 DIAGNOSIS — N186 End stage renal disease: Secondary | ICD-10-CM

## 2019-04-02 DIAGNOSIS — E118 Type 2 diabetes mellitus with unspecified complications: Secondary | ICD-10-CM | POA: Diagnosis not present

## 2019-04-02 DIAGNOSIS — I10 Essential (primary) hypertension: Secondary | ICD-10-CM | POA: Diagnosis not present

## 2019-04-02 DIAGNOSIS — I639 Cerebral infarction, unspecified: Secondary | ICD-10-CM | POA: Diagnosis not present

## 2019-04-02 DIAGNOSIS — I998 Other disorder of circulatory system: Secondary | ICD-10-CM | POA: Diagnosis not present

## 2019-04-02 DIAGNOSIS — I739 Peripheral vascular disease, unspecified: Secondary | ICD-10-CM | POA: Diagnosis not present

## 2019-04-02 DIAGNOSIS — Z992 Dependence on renal dialysis: Secondary | ICD-10-CM

## 2019-04-02 NOTE — Progress Notes (Signed)
MRN : [id number]  Stephanie Ellis is a 68 y.o. (1951/04/22) female who presents with chief complaint of  Chief Complaint  Patient presents with  . Follow-up    surgical consult  .  History of Present Illness: Patient returns today in follow up of renal failure to discuss permanent access options.  She has had a left jugular PermCath in place for about a year or a little more.  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  We saw her back about 4 months ago and discussed doing a right arm AV graft but she has been hesitant to have this performed.  She has known lower extremity peripheral arterial disease and has already had bilateral below-knee amputations.  She is in her usual state of health today without complaints  Current Outpatient Medications  Medication Sig Dispense Refill  . apixaban (ELIQUIS) 2.5 MG TABS tablet Take 1 tablet (2.5 mg total) by mouth 2 (two) times daily. 60 tablet 0  . aspirin EC 81 MG tablet Take 1 tablet (81 mg total) by mouth daily.    Marland Kitchen atorvastatin (LIPITOR) 80 MG tablet Take 80 mg by mouth at bedtime.     Marland Kitchen BETIMOL 0.5 % ophthalmic solution     . brimonidine (ALPHAGAN) 0.2 % ophthalmic solution Place 1 drop into both eyes 3 (three) times daily. 5 mL 12  . carvedilol (COREG) 25 MG tablet Take 1 tablet (25 mg total) by mouth 2 (two) times daily. 60 tablet 0  . docusate sodium (COLACE) 100 MG capsule Take 1 capsule (100 mg total) by mouth 2 (two) times daily. 10 capsule 0  . famotidine (PEPCID) 20 MG tablet Take 20 mg by mouth 2 (two) times daily.    . insulin glargine (LANTUS) 100 UNIT/ML injection Inject 2 Units into the skin at bedtime.    . isosorbide mononitrate (IMDUR) 30 MG 24 hr tablet Take 30 mg by mouth daily.     Marland Kitchen levETIRAcetam (KEPPRA) 250 MG tablet Three times weekly after dialysis    . levETIRAcetam (KEPPRA) 500 MG tablet Take 1 tablet (500 mg total) by mouth 2 (two) times daily.    .  mirtazapine (REMERON) 7.5 MG tablet Take 7.5 mg by mouth daily.     . nitroGLYCERIN (NITROSTAT) 0.4 MG SL tablet Place 1 tablet (0.4 mg total) under the tongue every 5 (five) minutes as needed for chest pain. 30 tablet 0  . Nutritional Supplements (FEEDING SUPPLEMENT, NEPRO CARB STEADY,) LIQD Take 237 mLs by mouth 2 (two) times daily between meals. 60 Can 0  . Omega-3 1000 MG CAPS Take 2 capsules by mouth daily.     . ondansetron (ZOFRAN) 4 MG tablet Take 4 mg by mouth every 8 (eight) hours as needed for nausea or vomiting.    Vladimir Faster Glycol-Propyl Glycol 0.4-0.3 % SOLN Place 1 drop into both eyes 2 (two) times daily.    . Polyvinyl Alcohol-Povidone PF (REFRESH) 1.4-0.6 % SOLN Apply to eye 2 (two) times a day.    Marland Kitchen rOPINIRole (REQUIP) 0.25 MG tablet Take 1 tablet (0.25 mg total) by mouth at bedtime.    . sevelamer (RENAGEL) 800 MG tablet     . sevelamer carbonate (RENVELA) 800 MG tablet Take 1,600 mg by mouth 3 (three) times daily with meals.     . traMADol (ULTRAM) 50 MG tablet     . acetaminophen (TYLENOL) 325 MG tablet Take  2 tablets (650 mg total) by mouth every 6 (six) hours as needed for mild pain (or Fever >/= 101). (Patient not taking: Reported on 04/02/2019)    . epoetin alfa (EPOGEN,PROCRIT) 53664 UNIT/ML injection Inject 1 mL (10,000 Units total) into the vein Every Tuesday,Thursday,and Saturday with dialysis. (Patient not taking: Reported on 10/21/2018) 1 mL   . hydrALAZINE (APRESOLINE) 25 MG tablet Take 1 tablet (25 mg total) by mouth 3 (three) times daily for 30 days. 90 tablet 0  . multivitamin (RENA-VIT) TABS tablet Take 1 tablet by mouth at bedtime. (Patient not taking: Reported on 10/21/2018) 30 tablet 0  . timolol (TIMOPTIC) 0.5 % ophthalmic solution Place 1 drop into both eyes 3 (three) times daily. (Patient not taking: Reported on 10/21/2018) 10 mL 12  . vitamin B-12 (CYANOCOBALAMIN) 1000 MCG tablet Take 1 tablet (1,000 mcg total) by mouth daily. (Patient not taking: Reported on  04/02/2019) 30 tablet 0  . vitamin C (VITAMIN C) 500 MG tablet Take 1 tablet (500 mg total) by mouth 2 (two) times daily. (Patient not taking: Reported on 04/02/2019) 60 tablet 0   No current facility-administered medications for this visit.    Past Medical History:  Diagnosis Date  . (HFpEF) heart failure with preserved ejection fraction ([place])    a. 01/2018 Echo: EF 50-55%, no rwma, Gr1 DD, triv AI, mild MR. Midly dil LA/RV, mod reduced RV fxn. Irreg thickening of TV w/ mobile echodensity that appears to arise from valve.  . Anemia   . Anorexia   . Bacteremia due to Pseudomonas   . Carotid arterial disease ([place])    a. 01/2017 Carotid U/S: RICA <40, LICA <34.  Marland Kitchen Complication of anesthesia    a. Pt reports h/o complication on 9 different occasions - ? hypotension/arrest.  . 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 01/2015 [facility]): 70% LM, p-mLAD 50-60% (Resting FFR 0.75), mRCA 80-90%, ~40 Ost OM & D1-->CABG; b. 01/2016 Staged PCI of LCX x 2 and RCA.  Marland Kitchen ESRD (end stage renal disease) on dialysis ([place])    a. ESRD secondary to acute kidney failure s/p CABG-->PD  . Essential hypertension   . GERD (gastroesophageal reflux disease)   . Heart murmur   . Hypercholesterolemia   . Myocardial infarction ([place]) 2016  . PVD (peripheral vascular disease) ([place])    a. 01/30/2018 PV Angio: Sev Left Popliteal dzs s/p PTA w/ 47m lutonix DEB w/ mech aspiration of the L popliteal, L AT, and Left tibioperoneal trunck and peroneal artery.  . 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  [facility]: Dr. BMarland KitchenHaithcock: CABG x 3, LIMA to LAD, SVG to RCA, SVG to OM3, EVH  . Sinusitis 2019  . Type II diabetes mellitus with complication (HCC)    CAD    Past Surgical History:  Procedure Laterality Date  . ABDOMINAL HYSTERECTOMY    . AMPUTATION Right 02/25/2018   Procedure: AMPUTATION BELOW KNEE;  Surgeon: SKatha Cabal MD;  Location: [facility] OR[facility];  Service: Vascular;  Laterality: Right;  .  AMPUTATION Left 03/11/2018   Procedure: AMPUTATION BELOW KNEE;  Surgeon: DAlgernon Huxley MD;  Location: [facility];  Service: Vascular;  Laterality: Left;  . AMPUTATION Bilateral 04/13/2018   Procedure: REVISION OF BILATERAL AMPUTATIONS BELOW KNEE WITH WOUND VAC APPLICATIONS;  Surgeon: EEvaristo Bury MD;  Location: [facility];  Service: Vascular;  Laterality: Bilateral;  . AMPUTATION TOE Left 02/06/2018   Procedure: AMPUTATION GREAT TOE;  Surgeon:  Samara Deist, DPM;  Location: [facility];  Service: Podiatry;  Laterality: Left;  . APPLICATION OF WOUND VAC Left 04/13/2018   Procedure: APPLICATION OF WOUND VAC;  Surgeon: Evaristo Bury, MD;  Location: [facility];  Service: Vascular;  Laterality: Left;  . ARTERIOVENOUS GRAFT PLACEMENT  05/2016  . AV FISTULA PLACEMENT Left 05/30/2016   Procedure: ARTERIOVENOUS graft;  Surgeon: Algernon Huxley, MD;  Location: [facility];  Service: Vascular;  Laterality: Left;  . CAPD INSERTION N/A 10/30/2017   Procedure: LAPAROSCOPIC INSERTION CONTINUOUS AMBULATORY PERITONEAL DIALYSIS  (CAPD) CATHETER;  Surgeon: Algernon Huxley, MD;  Location: [facility];  Service: Vascular;  Laterality: N/A;  . CARDIAC CATHETERIZATION  01/2015   [facility]: Ost LM 70%, p-m LAD 50-60% (Rest FFR + @ 0.75), mRCA 80-90%, ostD1 40%, pOM1 40%  . CATARACT EXTRACTION W/PHACO Left 01/18/2016   Procedure: CATARACT EXTRACTION PHACO AND INTRAOCULAR LENS PLACEMENT (IOC);  Surgeon: Eulogio Bear, MD;  Location: [facility];  Service: Ophthalmology;  Laterality: Left;  [place] 1.05 AP% 15.5 CDE 10.16 Fluid Pack Lot # [id number]  . CATARACT EXTRACTION W/PHACO Right 08/01/2016   Procedure: CATARACT EXTRACTION PHACO AND INTRAOCULAR LENS PLACEMENT (IOC);  Surgeon: Eulogio Bear, MD;  Location: [facility];  Service: Ophthalmology;  Laterality: Right;  [place] 01:00.6 AP% 11.4 CDE 6.93  LOT # [id number]  . COLONOSCOPY    . CORONARY ANGIOPLASTY     SENTS 02/12/16  . CORONARY ARTERY BYPASS GRAFT  03/28/15    [facility]: Dr. Waldemar Dickens: LIMA  to LAD, SVG to RCA, SVG to OM3, EVH  . DIALYSIS/PERMA CATHETER INSERTION N/A 02/11/2018   Procedure: DIALYSIS/PERMA CATHETER INSERTION;  Surgeon: Algernon Huxley, MD;  Location: [place] [facility];  Service: Cardiovascular;  Laterality: N/A;  . DIALYSIS/PERMA CATHETER REMOVAL Right 02/04/2018   Procedure: DIALYSIS/PERMA CATHETER REMOVAL;  Surgeon: Katha Cabal, MD;  Location: [place] [facility];  Service: Cardiovascular;  Laterality: Right;  . ESOPHAGOGASTRODUODENOSCOPY (EGD) WITH PROPOFOL N/A 10/24/2016   Procedure: ESOPHAGOGASTRODUODENOSCOPY (EGD) WITH PROPOFOL;  Surgeon: [name], MD;  Location: [facility];  Service: Endoscopy;  Laterality: N/A;  . EYE SURGERY Bilateral    cataract surgery  . EYE SURGERY     drains for glaucoma  . INSERTION EXPRESS TUBE SHUNT Right 08/01/2016   Procedure: INSERTION EXPRESS TUBE SHUNT;  Surgeon: Eulogio Bear, MD;  Location: [facility];  Service: Ophthalmology;  Laterality: Right;  . INSERTION OF AHMED VALVE Left 08/15/2016   Procedure: INSERTION OF AHMED VALVE;  Surgeon: Eulogio Bear, MD;  Location: [facility];  Service: Ophthalmology;  Laterality: Left;  . INSERTION OF DIALYSIS CATHETER    . LOWER EXTREMITY ANGIOGRAPHY Right 02/19/2018   Procedure: Lower Extremity Angiography;  Surgeon: Algernon Huxley, MD;  Location: [place] [facility];  Service: Cardiovascular;  Laterality: Right;  . LOWER EXTREMITY ANGIOGRAPHY Left 01/30/2018   Procedure: Lower Extremity Angiography;  Surgeon: Katha Cabal, MD;  Location: [place] [facility];  Service: Cardiovascular;  Laterality: Left;  . REMOVAL OF A DIALYSIS CATHETER N/A 02/25/2018   Procedure: REMOVAL OF A DIALYSIS CATHETER;  Surgeon: Katha Cabal, MD;  Location: [facility];  Service: Vascular;  Laterality: N/A;  . TEMPORARY DIALYSIS CATHETER N/A 02/06/2018   Procedure: TEMPORARY DIALYSIS CATHETER;  Surgeon: Katha Cabal, MD;  Location: [place] [facility];  Service: Cardiovascular;   Laterality: N/A;  . TRANSTHORACIC ECHOCARDIOGRAM  January 2017    EF 60-65%. GR 2 DD. Mild degenerative mitral valve disease  but no prolapse or regurgitation. Mild left atrial dilation. Mild to moderate LVH. Pericardial effusion gone  . UPPER EXTREMITY ANGIOGRAPHY Left 06/27/2016   Procedure: Upper Extremity Angiography;  Surgeon: Algernon Huxley, MD;  Location: [place] [facility];  Service: Cardiovascular;  Laterality: Left;  . UPPER EXTREMITY ANGIOGRAPHY Left 08/22/2016   Procedure: Upper Extremity Angiography;  Surgeon: Algernon Huxley, MD;  Location: [place] [facility];  Service: Cardiovascular;  Laterality: Left;  Marland Kitchen VASCULAR SURGERY    . WOUND DEBRIDEMENT Left 05/08/2018   Procedure: DEBRIDEMENT OF LEFT LEG BKA;  Surgeon: Algernon Huxley, MD;  Location: [facility];  Service: General;  Laterality: Left;     Social History   Tobacco Use  . Smoking status: Never Smoker  . Smokeless tobacco: Never Used  Substance Use Topics  . Alcohol use: No  . Drug use: No    Family History  Problem Relation Age of Onset  . Diabetes Mellitus II Mother   . Heart failure Mother   . Pancreatic cancer Father   No bleeding or clotting disorders  Allergies  Allergen Reactions  . Chlorthalidone Anaphylaxis, Itching and Rash  . Fentanyl Rash  . Midazolam Rash  . Ace Inhibitors Other (See Comments)    Reaction:  Hyperkalemia, agitation   . Angiotensin Receptor Blockers Other (See Comments)    Reaction:  Hyperkalemia, agitation   . Norvasc [Amlodipine] Itching and Rash  . Phenergan [Promethazine Hcl] Anxiety    "antsy, can't sit still"     REVIEW OF SYSTEMS (Negative unless checked)  Constitutional: [] Weight loss  [] Fever  [] Chills Cardiac: [x] Chest pain   [] Chest pressure   [x] Palpitations   [] Shortness of breath when laying flat   [] Shortness of breath at rest   [x] Shortness of breath with exertion. Vascular:  [] Pain in legs with walking   [] Pain in legs at rest   [] Pain in legs when laying flat    [] Claudication   [] Pain in feet when walking  [] Pain in feet at rest  [] Pain in feet when laying flat   [] History of DVT   [] Phlebitis   [] Swelling in legs   [] Varicose veins   [x] Non-healing ulcers X status post bilateral amputation Pulmonary:   [] Uses home oxygen   [] Productive cough   [] Hemoptysis   [] Wheeze  [] COPD   [] Asthma Neurologic:  [] Dizziness  [] Blackouts   [] Seizures   [] History of stroke   [] History of TIA  [] Aphasia   [] Temporary blindness   [] Dysphagia   [] Weakness or numbness in arms   [] Weakness or numbness in legs Musculoskeletal:  [x] Arthritis   [] Joint swelling   [] Joint pain   [] Low back pain Hematologic:  [] Easy bruising  [] Easy bleeding   [] Hypercoagulable state   [x] Anemic   Gastrointestinal:  [] Blood in stool   [] Vomiting blood  [] Gastroesophageal reflux/heartburn   [] Abdominal pain Genitourinary:  [x] Chronic kidney disease   [] Difficult urination  [] Frequent urination  [] Burning with urination   [] Hematuria Skin:  [] Rashes   [] Ulcers   [] Wounds Psychological:  [] History of anxiety   []  History of major depression.  Physical Examination  BP (!) 94/51 (BP Location: Right Arm)   Pulse 66   Resp 16   Ht 5' 4"  (1.626 m)   Wt 118 lb (53.5 kg)   BMI 20.25 kg/m  Gen:  WD/WN, NAD Head: [place]/AT, No temporalis wasting. Ear/Nose/Throat: Hearing grossly intact, nares w/o erythema or drainage Eyes: Conjunctiva clear. Sclera non-icteric Neck: Supple.  Trachea midline Pulmonary:  Good air  movement, no use of accessory muscles.  Cardiac: RRR, no JVD Vascular: PermCath present in the left subclavicular space without erythema or drainage Vessel Right Left  Radial Palpable Palpable               Musculoskeletal: M/S 5/5 throughout.  In a wheelchair.  Bilateral below-knee amputations well-healed Neurologic: Sensation grossly intact in extremities.  Symmetrical.  Speech is fluent.  Psychiatric: Judgment intact, Mood & affect appropriate for pt's clinical  situation. Dermatologic: No rashes or ulcers noted.  No cellulitis or open wounds.       Labs No results found for this or any previous visit (from the past 2160 hour(s)).  Radiology No results found.  Assessment/Plan  Type II diabetes mellitus with complication (HCC) blood glucose control important in reducing the progression of atherosclerotic disease. Also, involved in wound healing. On appropriate medications.   Hypertension blood pressure control important in reducing the progression of atherosclerotic disease. On appropriate oral medications.   Ischemia of extremity Had a left arm access that had to be ligated for steal.  Has been hesitant to have anything further done in her arms.  Peripheral vascular disease ([place]) Now status post amputations  ESRD (end stage renal disease) on dialysis [facility]) We had a long discussion today regarding her dialysis access options.  We explained the limitations with remaining catheter dependent with a lower expected long-term survival, higher risk of infection, and a higher risk of central venous stenosis.  She is very concerned, understandably so, about steal syndrome in the right arm and does not want to have a graft placed in the right arm.  We discussed potentially doing a thigh graft.  We discussed the limitations with that which could include infection or steal syndrome but would still be lower risks then continued PermCath dependence.  At this time, she does not want to be stuck for dialysis and wants to continue with her PermCath.  We will see her back as needed at this point.    Leotis Pain, MD  04/02/2019 10:05 AM    This note was created with Dragon medical transcription system.  Any errors from dictation are purely unintentional

## 2019-04-02 NOTE — Assessment & Plan Note (Signed)
blood pressure control important in reducing the progression of atherosclerotic disease. On appropriate oral medications.  

## 2019-04-02 NOTE — Assessment & Plan Note (Signed)
blood glucose control important in reducing the progression of atherosclerotic disease. Also, involved in wound healing. On appropriate medications.  

## 2019-04-02 NOTE — Assessment & Plan Note (Signed)
Had a left arm access that had to be ligated for steal.  Has been hesitant to have anything further done in her arms.

## 2019-04-02 NOTE — Assessment & Plan Note (Signed)
Now status post amputations

## 2019-04-02 NOTE — Assessment & Plan Note (Signed)
We had a long discussion today regarding her dialysis access options.  We explained the limitations with remaining catheter dependent with a lower expected long-term survival, higher risk of infection, and a higher risk of central venous stenosis.  She is very concerned, understandably so, about steal syndrome in the right arm and does not want to have a graft placed in the right arm.  We discussed potentially doing a thigh graft.  We discussed the limitations with that which could include infection or steal syndrome but would still be lower risks then continued PermCath dependence.  At this time, she does not want to be stuck for dialysis and wants to continue with her PermCath.  We will see her back as needed at this point.

## 2019-05-01 ENCOUNTER — Emergency Department: Payer: Medicare Other

## 2019-05-01 ENCOUNTER — Inpatient Hospital Stay
Admission: EM | Admit: 2019-05-01 | Discharge: 2019-05-04 | DRG: 811 | Disposition: A | Discharge status: Skilled Nursing Facility | Payer: Medicare Other | Source: Ambulatory Visit | Attending: Internal Medicine | Admitting: Internal Medicine

## 2019-05-01 ENCOUNTER — Encounter: Payer: Self-pay | Admitting: Emergency Medicine

## 2019-05-01 ENCOUNTER — Other Ambulatory Visit: Payer: Self-pay

## 2019-05-01 DIAGNOSIS — E7800 Pure hypercholesterolemia, unspecified: Secondary | ICD-10-CM | POA: Diagnosis present

## 2019-05-01 DIAGNOSIS — D649 Anemia, unspecified: Secondary | ICD-10-CM | POA: Diagnosis present

## 2019-05-01 DIAGNOSIS — Z89512 Acquired absence of left leg below knee: Secondary | ICD-10-CM | POA: Diagnosis not present

## 2019-05-01 DIAGNOSIS — N186 End stage renal disease: Secondary | ICD-10-CM | POA: Diagnosis present

## 2019-05-01 DIAGNOSIS — K828 Other specified diseases of gallbladder: Secondary | ICD-10-CM | POA: Diagnosis present

## 2019-05-01 DIAGNOSIS — I132 Hypertensive heart and chronic kidney disease with heart failure and with stage 5 chronic kidney disease, or end stage renal disease: Secondary | ICD-10-CM | POA: Diagnosis present

## 2019-05-01 DIAGNOSIS — K7581 Nonalcoholic steatohepatitis (NASH): Secondary | ICD-10-CM | POA: Diagnosis present

## 2019-05-01 DIAGNOSIS — I739 Peripheral vascular disease, unspecified: Secondary | ICD-10-CM | POA: Diagnosis not present

## 2019-05-01 DIAGNOSIS — D696 Thrombocytopenia, unspecified: Secondary | ICD-10-CM | POA: Diagnosis present

## 2019-05-01 DIAGNOSIS — I252 Old myocardial infarction: Secondary | ICD-10-CM | POA: Diagnosis not present

## 2019-05-01 DIAGNOSIS — E1151 Type 2 diabetes mellitus with diabetic peripheral angiopathy without gangrene: Secondary | ICD-10-CM | POA: Diagnosis present

## 2019-05-01 DIAGNOSIS — K219 Gastro-esophageal reflux disease without esophagitis: Secondary | ICD-10-CM | POA: Diagnosis present

## 2019-05-01 DIAGNOSIS — E1169 Type 2 diabetes mellitus with other specified complication: Secondary | ICD-10-CM

## 2019-05-01 DIAGNOSIS — J1282 Pneumonia due to coronavirus disease 2019: Secondary | ICD-10-CM | POA: Diagnosis present

## 2019-05-01 DIAGNOSIS — Z888 Allergy status to other drugs, medicaments and biological substances status: Secondary | ICD-10-CM

## 2019-05-01 DIAGNOSIS — R0902 Hypoxemia: Secondary | ICD-10-CM | POA: Diagnosis present

## 2019-05-01 DIAGNOSIS — Z951 Presence of aortocoronary bypass graft: Secondary | ICD-10-CM

## 2019-05-01 DIAGNOSIS — Z89511 Acquired absence of right leg below knee: Secondary | ICD-10-CM

## 2019-05-01 DIAGNOSIS — Z794 Long term (current) use of insulin: Secondary | ICD-10-CM

## 2019-05-01 DIAGNOSIS — E1122 Type 2 diabetes mellitus with diabetic chronic kidney disease: Secondary | ICD-10-CM | POA: Diagnosis present

## 2019-05-01 DIAGNOSIS — N2581 Secondary hyperparathyroidism of renal origin: Secondary | ICD-10-CM | POA: Diagnosis present

## 2019-05-01 DIAGNOSIS — D631 Anemia in chronic kidney disease: Secondary | ICD-10-CM | POA: Diagnosis present

## 2019-05-01 DIAGNOSIS — I5032 Chronic diastolic (congestive) heart failure: Secondary | ICD-10-CM | POA: Diagnosis present

## 2019-05-01 DIAGNOSIS — Z79899 Other long term (current) drug therapy: Secondary | ICD-10-CM

## 2019-05-01 DIAGNOSIS — I2510 Atherosclerotic heart disease of native coronary artery without angina pectoris: Secondary | ICD-10-CM | POA: Diagnosis present

## 2019-05-01 DIAGNOSIS — Z833 Family history of diabetes mellitus: Secondary | ICD-10-CM

## 2019-05-01 DIAGNOSIS — Z885 Allergy status to narcotic agent status: Secondary | ICD-10-CM

## 2019-05-01 DIAGNOSIS — E785 Hyperlipidemia, unspecified: Secondary | ICD-10-CM

## 2019-05-01 DIAGNOSIS — K761 Chronic passive congestion of liver: Secondary | ICD-10-CM | POA: Diagnosis not present

## 2019-05-01 DIAGNOSIS — Z8249 Family history of ischemic heart disease and other diseases of the circulatory system: Secondary | ICD-10-CM

## 2019-05-01 DIAGNOSIS — G40909 Epilepsy, unspecified, not intractable, without status epilepticus: Secondary | ICD-10-CM | POA: Diagnosis present

## 2019-05-01 DIAGNOSIS — Z800 Family history of malignant neoplasm of digestive organs: Secondary | ICD-10-CM

## 2019-05-01 DIAGNOSIS — K8020 Calculus of gallbladder without cholecystitis without obstruction: Secondary | ICD-10-CM | POA: Diagnosis present

## 2019-05-01 DIAGNOSIS — F329 Major depressive disorder, single episode, unspecified: Secondary | ICD-10-CM | POA: Diagnosis present

## 2019-05-01 DIAGNOSIS — Z955 Presence of coronary angioplasty implant and graft: Secondary | ICD-10-CM

## 2019-05-01 DIAGNOSIS — E8881 Metabolic syndrome: Secondary | ICD-10-CM | POA: Diagnosis present

## 2019-05-01 DIAGNOSIS — Z90710 Acquired absence of both cervix and uterus: Secondary | ICD-10-CM

## 2019-05-01 DIAGNOSIS — D62 Acute posthemorrhagic anemia: Principal | ICD-10-CM | POA: Diagnosis present

## 2019-05-01 DIAGNOSIS — Z992 Dependence on renal dialysis: Secondary | ICD-10-CM | POA: Diagnosis not present

## 2019-05-01 DIAGNOSIS — U071 COVID-19: Secondary | ICD-10-CM | POA: Diagnosis present

## 2019-05-01 DIAGNOSIS — E119 Type 2 diabetes mellitus without complications: Secondary | ICD-10-CM

## 2019-05-01 DIAGNOSIS — F419 Anxiety disorder, unspecified: Secondary | ICD-10-CM | POA: Diagnosis present

## 2019-05-01 DIAGNOSIS — Z66 Do not resuscitate: Secondary | ICD-10-CM | POA: Diagnosis present

## 2019-05-01 DIAGNOSIS — R188 Other ascites: Secondary | ICD-10-CM | POA: Diagnosis present

## 2019-05-01 DIAGNOSIS — N185 Chronic kidney disease, stage 5: Secondary | ICD-10-CM | POA: Diagnosis present

## 2019-05-01 DIAGNOSIS — K7469 Other cirrhosis of liver: Secondary | ICD-10-CM | POA: Diagnosis not present

## 2019-05-01 DIAGNOSIS — Z7982 Long term (current) use of aspirin: Secondary | ICD-10-CM

## 2019-05-01 DIAGNOSIS — Z8679 Personal history of other diseases of the circulatory system: Secondary | ICD-10-CM

## 2019-05-01 DIAGNOSIS — Z9181 History of falling: Secondary | ICD-10-CM

## 2019-05-01 DIAGNOSIS — Z7901 Long term (current) use of anticoagulants: Secondary | ICD-10-CM

## 2019-05-01 LAB — CBC
HCT: 20.6 % — ABNORMAL LOW (ref 36.0–46.0)
Hemoglobin: 5.9 g/dL — ABNORMAL LOW (ref 12.0–15.0)
MCH: 28.1 pg (ref 26.0–34.0)
MCHC: 28.6 g/dL — ABNORMAL LOW (ref 30.0–36.0)
MCV: 98.1 fL (ref 80.0–100.0)
Platelets: 133 10*3/uL — ABNORMAL LOW (ref 150–400)
RBC: 2.1 MIL/uL — ABNORMAL LOW (ref 3.87–5.11)
RDW: 19.1 % — ABNORMAL HIGH (ref 11.5–15.5)
WBC: 4 10*3/uL (ref 4.0–10.5)
nRBC: 0.8 % — ABNORMAL HIGH (ref 0.0–0.2)

## 2019-05-01 LAB — RESPIRATORY PANEL BY RT PCR (FLU A&B, COVID)
Influenza A by PCR: NEGATIVE
Influenza B by PCR: NEGATIVE
SARS Coronavirus 2 by RT PCR: POSITIVE — AB

## 2019-05-01 LAB — BASIC METABOLIC PANEL
Anion gap: 15 (ref 5–15)
BUN: 32 mg/dL — ABNORMAL HIGH (ref 8–23)
CO2: 28 mmol/L (ref 22–32)
Calcium: 7.7 mg/dL — ABNORMAL LOW (ref 8.9–10.3)
Chloride: 95 mmol/L — ABNORMAL LOW (ref 98–111)
Creatinine, Ser: 5.76 mg/dL — ABNORMAL HIGH (ref 0.44–1.00)
GFR calc Af Amer: 8 mL/min — ABNORMAL LOW (ref >60)
GFR calc non Af Amer: 7 mL/min — ABNORMAL LOW (ref >60)
Glucose, Bld: 101 mg/dL — ABNORMAL HIGH (ref 70–99)
Potassium: 3.6 mmol/L (ref 3.5–5.1)
Sodium: 138 mmol/L (ref 135–145)

## 2019-05-01 LAB — RETICULOCYTES
Immature Retic Fract: 13 % (ref 2.3–15.9)
RBC.: 2.11 MIL/uL — ABNORMAL LOW (ref 3.87–5.11)
Retic Count, Absolute: 51.3 10*3/uL (ref 19.0–186.0)
Retic Ct Pct: 2.4 % (ref 0.4–3.1)

## 2019-05-01 LAB — PROTIME-INR
INR: 1.4 — ABNORMAL HIGH (ref 0.8–1.2)
Prothrombin Time: 17.1 seconds — ABNORMAL HIGH (ref 11.4–15.2)

## 2019-05-01 LAB — HEPATIC FUNCTION PANEL
ALT: 22 U/L (ref 0–44)
AST: 26 U/L (ref 15–41)
Albumin: 2.8 g/dL — ABNORMAL LOW (ref 3.5–5.0)
Alkaline Phosphatase: 94 U/L (ref 38–126)
Bilirubin, Direct: <0.1 mg/dL (ref 0.0–0.2)
Total Bilirubin: 0.7 mg/dL (ref 0.3–1.2)
Total Protein: 6.3 g/dL — ABNORMAL LOW (ref 6.5–8.1)

## 2019-05-01 LAB — LACTATE DEHYDROGENASE: LDH: 233 U/L — ABNORMAL HIGH (ref 98–192)

## 2019-05-01 LAB — FERRITIN: Ferritin: 3918 ng/mL — ABNORMAL HIGH (ref 11–307)

## 2019-05-01 LAB — PREPARE RBC (CROSSMATCH)

## 2019-05-01 LAB — IRON AND TIBC
Iron: 24 ug/dL — ABNORMAL LOW (ref 28–170)
Saturation Ratios: 17 % (ref 10.4–31.8)
TIBC: 141 ug/dL — ABNORMAL LOW (ref 250–450)
UIBC: 117 ug/dL

## 2019-05-01 LAB — APTT: aPTT: 39 seconds — ABNORMAL HIGH (ref 24–36)

## 2019-05-01 MED ORDER — SODIUM CHLORIDE 0.9 % IV SOLN
200.0000 mg | Freq: Once | INTRAVENOUS | Status: DC
  Filled 2019-05-01: qty 40

## 2019-05-01 MED ORDER — SODIUM CHLORIDE 0.9 % IV SOLN
10.0000 mL/h | Freq: Once | INTRAVENOUS | Status: AC
  Administered 2019-05-01: 18:00:00 10 mL/h via INTRAVENOUS

## 2019-05-01 MED ORDER — SODIUM CHLORIDE 0.9 % IV SOLN
100.0000 mg | Freq: Every day | INTRAVENOUS | Status: DC
  Filled 2019-05-01: qty 20

## 2019-05-01 NOTE — ED Triage Notes (Signed)
Pt to ER via EMS from [facility] with c/o low Hgb.  Pt voices no c/o at this time.  Pt is dialysis pt.

## 2019-05-01 NOTE — ED Notes (Signed)
Date and time results received: 05/01/19 4:36 PM    Test: CoVid Critical Value: Pos  Name of Provider Notified: Dr. Mal [name], Dr. Jari Pigg

## 2019-05-01 NOTE — H&P (Addendum)
History and Physical:    Stephanie Ellis   FUX:[id number] DOB: 1950-08-27 DOA: 05/01/2019  Referring MD/provider: Marjean Donna, MD PCP: Kirk Ruths, MD   Patient coming from: Home  Chief Complaint: Low hemoglobin  History of Present Illness:   Stephanie Ellis is an 69 y.o. female with medical history significant for chronic diastolic CHF, anemia, carotid artery disease, CAD, PVD status post bilateral BKA end-stage renal disease on hemodialysis on Tuesdays Thursdays and Saturdays, hypertension, GERD.  She was brought to the hospital because of low hemoglobin.  She said she went to outpatient dialysis today and was referred to the ED because of low hemoglobin.  She complains of cough that she has had for the past few days.  She does not have any other complaints.  No shortness of breath, chest pain, wheezing, dizziness, palpitations, hematuria, bleeding per vagina, bloody stools, hematemesis, abdominal pain.  ED Course:  The patient found to have severe anemia with a hemoglobin of 5.6 in the emergency room.  Oxygen saturation was slightly low at 89% on room air and she was found to have COVID-19 pneumonia.  Stool for occult blood was negative per ED physician.  ROS:   ROS all other systems reviewed were negative  Past Medical History:   Past Medical History:  Diagnosis Date  . (HFpEF) heart failure with preserved ejection fraction ([place])    a. 01/2018 Echo: EF 50-55%, no rwma, Gr1 DD, triv AI, mild MR. Midly dil LA/RV, mod reduced RV fxn. Irreg thickening of TV w/ mobile echodensity that appears to arise from valve.  . Anemia   . Anorexia   . Bacteremia due to Pseudomonas   . Carotid arterial disease ([place])    a. 01/2017 Carotid U/S: RICA <02, LICA <54.  Marland Kitchen Complication of anesthesia    a. Pt reports h/o complication on 9 different occasions - ? hypotension/arrest.  . 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 01/2015 Cath [facility]): 70% LM, p-mLAD  50-60% (Resting FFR 0.75), mRCA 80-90%, ~40 Ost OM & D1-->CABG; b. 01/2016 Staged PCI of LCX x 2 and RCA.  Marland Kitchen ESRD (end stage renal disease) on dialysis ([place])    a. ESRD secondary to acute kidney failure s/p CABG-->PD  . Essential hypertension   . GERD (gastroesophageal reflux disease)   . Heart murmur   . Hypercholesterolemia   . Myocardial infarction ([place]) 2016  . PVD (peripheral vascular disease) ([place])    a. 01/30/2018 PV Angio: Sev Left Popliteal dzs s/p PTA w/ 36m lutonix DEB w/ mech aspiration of the L popliteal, L AT, and Left tibioperoneal trunck and peroneal artery.  . 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  [facility]: Dr. BMarland KitchenHaithcock: CABG x 3, LIMA to LAD, SVG to RCA, SVG to OM3, EVH  . Sinusitis 2019  . Type II diabetes mellitus with complication (HCC)    CAD    Past Surgical History:   Past Surgical History:  Procedure Laterality Date  . ABDOMINAL HYSTERECTOMY    . AMPUTATION Right 02/25/2018   Procedure: AMPUTATION BELOW KNEE;  Surgeon: SKatha Cabal MD;  Location: [facility] ORS;  Service: Vascular;  Laterality: Right;  . AMPUTATION Left 03/11/2018   Procedure: AMPUTATION BELOW KNEE;  Surgeon: DAlgernon Huxley MD;  Location: [facility] OR[facility];  Service: Vascular;  Laterality: Left;  . AMPUTATION Bilateral 04/13/2018   Procedure: REVISION OF BILATERAL AMPUTATIONS BELOW KNEE WITH WOUND VAC APPLICATIONS;  Surgeon: EEvaristo Bury MD;  Location:  [facility];  Service: Vascular;  Laterality: Bilateral;  . AMPUTATION TOE Left 02/06/2018   Procedure: AMPUTATION GREAT TOE;  Surgeon: Samara Deist, DPM;  Location: [facility];  Service: Podiatry;  Laterality: Left;  . APPLICATION OF WOUND VAC Left 04/13/2018   Procedure: APPLICATION OF WOUND VAC;  Surgeon: Evaristo Bury, MD;  Location: [facility];  Service: Vascular;  Laterality: Left;  . ARTERIOVENOUS GRAFT PLACEMENT  05/2016  . AV FISTULA PLACEMENT Left 05/30/2016   Procedure: ARTERIOVENOUS graft;  Surgeon: Algernon Huxley, MD;  Location: [facility];   Service: Vascular;  Laterality: Left;  . CAPD INSERTION N/A 10/30/2017   Procedure: LAPAROSCOPIC INSERTION CONTINUOUS AMBULATORY PERITONEAL DIALYSIS  (CAPD) CATHETER;  Surgeon: Algernon Huxley, MD;  Location: [facility];  Service: Vascular;  Laterality: N/A;  . CARDIAC CATHETERIZATION  01/2015   [facility]: Ost LM 70%, p-m LAD 50-60% (Rest FFR + @ 0.75), mRCA 80-90%, ostD1 40%, pOM1 40%  . CATARACT EXTRACTION W/PHACO Left 01/18/2016   Procedure: CATARACT EXTRACTION PHACO AND INTRAOCULAR LENS PLACEMENT (IOC);  Surgeon: Eulogio Bear, MD;  Location: [facility];  Service: Ophthalmology;  Laterality: Left;  [place] 1.05 AP% 15.5 CDE 10.16 Fluid Pack Lot # [id number]  . CATARACT EXTRACTION W/PHACO Right 08/01/2016   Procedure: CATARACT EXTRACTION PHACO AND INTRAOCULAR LENS PLACEMENT (IOC);  Surgeon: Eulogio Bear, MD;  Location: [facility];  Service: Ophthalmology;  Laterality: Right;  [place] 01:00.6 AP% 11.4 CDE 6.93  LOT # [id number]  . COLONOSCOPY    . CORONARY ANGIOPLASTY     SENTS 02/12/16  . CORONARY ARTERY BYPASS GRAFT  03/28/15    [facility]: Dr. Waldemar Dickens: LIMA to LAD, SVG to RCA, SVG to OM3, EVH  . DIALYSIS/PERMA CATHETER INSERTION N/A 02/11/2018   Procedure: DIALYSIS/PERMA CATHETER INSERTION;  Surgeon: Algernon Huxley, MD;  Location: [place] [facility];  Service: Cardiovascular;  Laterality: N/A;  . DIALYSIS/PERMA CATHETER REMOVAL Right 02/04/2018   Procedure: DIALYSIS/PERMA CATHETER REMOVAL;  Surgeon: Katha Cabal, MD;  Location: [place] [facility];  Service: Cardiovascular;  Laterality: Right;  . ESOPHAGOGASTRODUODENOSCOPY (EGD) WITH PROPOFOL N/A 10/24/2016   Procedure: ESOPHAGOGASTRODUODENOSCOPY (EGD) WITH PROPOFOL;  Surgeon: [name], MD;  Location: [facility];  Service: Endoscopy;  Laterality: N/A;  . EYE SURGERY Bilateral    cataract surgery  . EYE SURGERY     drains for glaucoma  . INSERTION EXPRESS TUBE SHUNT Right 08/01/2016   Procedure: INSERTION EXPRESS TUBE SHUNT;  Surgeon: Eulogio Bear, MD;  Location: [facility];  Service: Ophthalmology;  Laterality: Right;  . INSERTION OF AHMED VALVE Left 08/15/2016   Procedure: INSERTION OF AHMED VALVE;  Surgeon: Eulogio Bear, MD;  Location: [facility];  Service: Ophthalmology;  Laterality: Left;  . INSERTION OF DIALYSIS CATHETER    . LOWER EXTREMITY ANGIOGRAPHY Right 02/19/2018   Procedure: Lower Extremity Angiography;  Surgeon: Algernon Huxley, MD;  Location: [place] [facility];  Service: Cardiovascular;  Laterality: Right;  . LOWER EXTREMITY ANGIOGRAPHY Left 01/30/2018   Procedure: Lower Extremity Angiography;  Surgeon: Katha Cabal, MD;  Location: [place] [facility];  Service: Cardiovascular;  Laterality: Left;  . REMOVAL OF A DIALYSIS CATHETER N/A 02/25/2018   Procedure: REMOVAL OF A DIALYSIS CATHETER;  Surgeon: Katha Cabal, MD;  Location: [facility];  Service: Vascular;  Laterality: N/A;  . TEMPORARY DIALYSIS CATHETER N/A 02/06/2018   Procedure: TEMPORARY DIALYSIS CATHETER;  Surgeon: Katha Cabal, MD;  Location: [place] [facility];  Service: Cardiovascular;  Laterality: N/A;  . TRANSTHORACIC ECHOCARDIOGRAM  January 2017    EF 60-65%. GR 2 DD. Mild degenerative mitral valve disease but no prolapse or regurgitation. Mild left atrial dilation. Mild to moderate LVH. Pericardial effusion gone  . UPPER EXTREMITY ANGIOGRAPHY Left 06/27/2016   Procedure: Upper Extremity Angiography;  Surgeon: Algernon Huxley, MD;  Location: [place] [facility];  Service: Cardiovascular;  Laterality: Left;  . UPPER EXTREMITY ANGIOGRAPHY Left 08/22/2016   Procedure: Upper Extremity Angiography;  Surgeon: Algernon Huxley, MD;  Location: [place] [facility];  Service: Cardiovascular;  Laterality: Left;  Marland Kitchen VASCULAR SURGERY    . WOUND DEBRIDEMENT Left 05/08/2018   Procedure: DEBRIDEMENT OF LEFT LEG BKA;  Surgeon: Algernon Huxley, MD;  Location: [facility];  Service: General;  Laterality: Left;    Social History:   Social History   Socioeconomic  History  . Marital status: Single    Spouse name: Not on file  . Number of children: Not on file  . Years of education: Not on file  . Highest education level: Not on file  Occupational History  . Not on file  Tobacco Use  . Smoking status: Never Smoker  . Smokeless tobacco: Never Used  Substance and Sexual Activity  . Alcohol use: No  . Drug use: No  . Sexual activity: Never  Other Topics Concern  . Not on file  Social History Narrative   Lives in [place] with family.   Social Determinants of Health   Financial Resource Strain:   . Difficulty of Paying Living Expenses: Not on file  Food Insecurity:   . Worried About Charity fundraiser in the Last Year: Not on file  . Ran Out of Food in the Last Year: Not on file  Transportation Needs:   . Lack of Transportation (Medical): Not on file  . Lack of Transportation (Non-Medical): Not on file  Physical Activity:   . Days of Exercise per Week: Not on file  . Minutes of Exercise per Session: Not on file  Stress:   . Feeling of Stress : Not on file  Social Connections:   . Frequency of Communication with Friends and Family: Not on file  . Frequency of Social Gatherings with Friends and Family: Not on file  . Attends Religious Services: Not on file  . Active Member of Clubs or Organizations: Not on file  . Attends Archivist Meetings: Not on file  . Marital Status: Not on file  Intimate Partner Violence:   . Fear of Current or Ex-Partner: Not on file  . Emotionally Abused: Not on file  . Physically Abused: Not on file  . Sexually Abused: Not on file    Allergies   Chlorthalidone, Fentanyl, Midazolam, Ace inhibitors, Angiotensin receptor blockers, Norvasc [amlodipine], and Phenergan [promethazine hcl]  Family history:   Family History  Problem Relation Age of Onset  . Diabetes Mellitus II Mother   . Heart failure Mother   . Pancreatic cancer Father     Current Medications:   Prior to Admission  medications   Medication Sig Start Date End Date Taking? Authorizing Provider  acetaminophen (TYLENOL) 325 MG tablet Take 2 tablets (650 mg total) by mouth every 6 (six) hours as needed for mild pain (or Fever >/= 101). Patient not taking: Reported on 04/02/2019 03/23/18   [name], MD  apixaban (ELIQUIS) 2.5 MG TABS tablet Take 1 tablet (2.5 mg total) by mouth 2 (two) times daily. 02/15/18   Anselm Jungling,  Rosalio Macadamia, MD  aspirin EC 81 MG tablet Take 1 tablet (81 mg total) by mouth daily. 10/19/16   Dustin Flock, MD  atorvastatin (LIPITOR) 80 MG tablet Take 80 mg by mouth at bedtime.  05/05/17   [provider]  BETIMOL 0.5 % ophthalmic solution  10/12/18   [provider]  brimonidine (ALPHAGAN) 0.2 % ophthalmic solution Place 1 drop into both eyes 3 (three) times daily. 03/18/18   Bettey Costa, MD  carvedilol (COREG) 25 MG tablet Take 1 tablet (25 mg total) by mouth 2 (two) times daily. 07/13/18   Stark Jock Jude, MD  docusate sodium (COLACE) 100 MG capsule Take 1 capsule (100 mg total) by mouth 2 (two) times daily. 02/15/18   Vaughan Basta, MD  epoetin alfa (EPOGEN,PROCRIT) 57262 UNIT/ML injection Inject 1 mL (10,000 Units total) into the vein Every Tuesday,Thursday,and Saturday with dialysis. Patient not taking: Reported on 10/21/2018 03/24/18   [name], MD  famotidine (PEPCID) 20 MG tablet Take 20 mg by mouth 2 (two) times daily.    [provider]  hydrALAZINE (APRESOLINE) 25 MG tablet Take 1 tablet (25 mg total) by mouth 3 (three) times daily for 30 days. 07/13/18 12/18/18  Stark Jock Jude, MD  insulin glargine (LANTUS) 100 UNIT/ML injection Inject 2 Units into the skin at bedtime.    [provider]  isosorbide mononitrate (IMDUR) 30 MG 24 hr tablet Take 30 mg by mouth daily.     [provider]  levETIRAcetam (KEPPRA) 250 MG tablet Three times weekly after dialysis 05/12/18   Loletha Grayer, MD  levETIRAcetam (KEPPRA) 500 MG tablet Take 1 tablet  (500 mg total) by mouth 2 (two) times daily. 03/23/18   Gouru, Illene Silver, MD  mirtazapine (REMERON) 7.5 MG tablet Take 7.5 mg by mouth daily.  12/18/17   [provider]  multivitamin (RENA-VIT) TABS tablet Take 1 tablet by mouth at bedtime. Patient not taking: Reported on 10/21/2018 05/12/18   Loletha Grayer, MD  nitroGLYCERIN (NITROSTAT) 0.4 MG SL tablet Place 1 tablet (0.4 mg total) under the tongue every 5 (five) minutes as needed for chest pain. 09/05/16   Loletha Grayer, MD  Nutritional Supplements (FEEDING SUPPLEMENT, NEPRO CARB STEADY,) LIQD Take 237 mLs by mouth 2 (two) times daily between meals. 05/12/18   Loletha Grayer, MD  Omega-3 1000 MG CAPS Take 2 capsules by mouth daily.     [provider]  ondansetron (ZOFRAN) 4 MG tablet Take 4 mg by mouth every 8 (eight) hours as needed for nausea or vomiting.    [provider]  Polyethyl Glycol-Propyl Glycol 0.4-0.3 % SOLN Place 1 drop into both eyes 2 (two) times daily.    [provider]  Polyvinyl Alcohol-Povidone PF (REFRESH) 1.4-0.6 % SOLN Apply to eye 2 (two) times a day.    [provider]  rOPINIRole (REQUIP) 0.25 MG tablet Take 1 tablet (0.25 mg total) by mouth at bedtime. 05/12/18   Loletha Grayer, MD  sevelamer (RENAGEL) 800 MG tablet  01/07/19   [provider]  sevelamer carbonate (RENVELA) 800 MG tablet Take 1,600 mg by mouth 3 (three) times daily with meals.     [provider]  timolol (TIMOPTIC) 0.5 % ophthalmic solution Place 1 drop into both eyes 3 (three) times daily. Patient not taking: Reported on 10/21/2018 03/18/18   Bettey Costa, MD  traMADol Veatrice Bourbon) 50 MG tablet  08/27/18   [provider]  vitamin B-12 (CYANOCOBALAMIN) 1000 MCG tablet Take 1 tablet (1,000 mcg total) by  mouth daily. Patient not taking: Reported on 04/02/2019 02/15/18   Vaughan Basta, MD  vitamin C (VITAMIN C) 500 MG tablet Take 1 tablet (500 mg total) by mouth 2 (two) times  daily. Patient not taking: Reported on 04/02/2019 05/12/18   Loletha Grayer, MD    Physical Exam:   Vitals:   05/01/19 1430 05/01/19 1530 05/01/19 1600 05/01/19 1630  BP: (!) 144/45 (!) 156/53 (!) 152/49 (!) 145/55  Pulse:  (!) 53 (!) 52   Resp: 13 13 10 16   Temp:      TempSrc:      SpO2:  100% 100%   Weight:      Height:         Physical Exam: Blood pressure (!) 145/55, pulse (!) 52, temperature 98.6 F (37 C), temperature source Oral, resp. rate 16, height 5' 4"  (1.626 m), weight 53.5 kg, SpO2 100 %. Gen: No acute distress. Head: Normocephalic, atraumatic. Eyes: Pupils equal, round and reactive to light. Extraocular movements intact.  Sclerae nonicteric.  Mouth: Moist mucous membranes Neck: Supple, no thyromegaly, no lymphadenopathy, no jugular venous distention. Chest: Lungs are clear to auscultation with good air movement. No rales, rhonchi or wheezes.  CV: Heart sounds are regular with an S1, S2. No murmurs, rubs or gallops. Abdomen: Soft, nontender, nondistended with normal active bowel sounds. No palpable masses. Extremities: Bilateral below-knee amputee. Skin: Warm and dry. No rashes, lesions or wounds Neuro: Alert and oriented times 3; grossly nonfocal.  Psych: Insight is good and judgment is appropriate. Mood and affect normal.   Data Review:    Labs: Basic Metabolic Panel: Recent Labs  Lab 05/01/19 1321  NA 138  K 3.6  CL 95*  CO2 28  GLUCOSE 101*  BUN 32*  CREATININE 5.76*  CALCIUM 7.7*   Liver Function Tests: No results for input(s): AST, ALT, ALKPHOS, BILITOT, PROT, ALBUMIN in the last 168 hours. No results for input(s): LIPASE, AMYLASE in the last 168 hours. No results for input(s): AMMONIA in the last 168 hours. CBC: Recent Labs  Lab 05/01/19 1321  WBC 4.0  HGB 5.9*  HCT 20.6*  MCV 98.1  PLT 133*   Cardiac Enzymes: No results for input(s): CKTOTAL, CKMB, CKMBINDEX, TROPONINI in the last 168 hours.  BNP (last 3 results) No results  for input(s): PROBNP in the last 8760 hours. CBG: No results for input(s): GLUCAP in the last 168 hours.  Urinalysis    Component Value Date/Time   COLORURINE RED (A) 10/17/2016 1643   APPEARANCEUR TURBID (A) 10/17/2016 1643   LABSPEC 1.014 10/17/2016 1643   PHURINE  10/17/2016 1643    TEST NOT REPORTED DUE TO COLOR INTERFERENCE OF URINE PIGMENT   GLUCOSEU (A) 10/17/2016 1643    TEST NOT REPORTED DUE TO COLOR INTERFERENCE OF URINE PIGMENT   HGBUR (A) 10/17/2016 1643    TEST NOT REPORTED DUE TO COLOR INTERFERENCE OF URINE PIGMENT   BILIRUBINUR (A) 10/17/2016 1643    TEST NOT REPORTED DUE TO COLOR INTERFERENCE OF URINE PIGMENT   KETONESUR (A) 10/17/2016 1643    TEST NOT REPORTED DUE TO COLOR INTERFERENCE OF URINE PIGMENT   PROTEINUR (A) 10/17/2016 1643    TEST NOT REPORTED DUE TO COLOR INTERFERENCE OF URINE PIGMENT   NITRITE (A) 10/17/2016 1643    TEST NOT REPORTED DUE TO COLOR INTERFERENCE OF URINE PIGMENT   LEUKOCYTESUR (A) 10/17/2016 1643    TEST NOT REPORTED DUE TO COLOR INTERFERENCE OF URINE PIGMENT      Radiographic  Studies: CT ABDOMEN PELVIS WO CONTRAST  Addendum Date: 05/01/2019   ADDENDUM REPORT: 05/01/2019 15:43 ADDENDUM: The FINDINGS and RECOMMENDATION incorrectly state that no radiopaque gallstones are identified. Small gallstones/gallbladder sludge are seen layering dependently within the gallbladder. There remains no evidence of biliary dilatation. Electronically Signed   By: Zerita Boers M.D.   On: 05/01/2019 15:43   Result Date: 05/01/2019 CLINICAL DATA:  Status post trauma and mid back pain. Concern for retroperitoneal bleed. Patient is anemic. EXAM: CT ABDOMEN AND PELVIS WITHOUT CONTRAST TECHNIQUE: Multidetector CT imaging of the abdomen and pelvis was performed following the standard protocol without IV contrast. COMPARISON:  CT abdomen pelvis dated 10/17/2016 FINDINGS: Lower chest: Calcified pleural scarring is seen in the lung bases, unchanged. There is bibasilar  atelectasis. The heart is enlarged. Hepatobiliary: No focal liver abnormality is seen. The gallbladder wall is thickened with a small amount of pericholecystic fluid. No radiopaque gallstones or biliary dilatation. Pancreas: Unremarkable. No pancreatic ductal dilatation or surrounding inflammatory changes. Spleen: Normal in size without focal abnormality. Adrenals/Urinary Tract: Adrenal glands are unremarkable. Kidneys are normal, without renal calculi, focal lesion, or hydronephrosis. A 7 mm calculus is seen in the upper right ureter. There is no hydroureter on either side. Bladder is incompletely distended. Stomach/Bowel: Stomach is within normal limits. Appendix appears normal. There is colonic diverticulosis without evidence of diverticulitis. No evidence of bowel wall thickening, distention, or inflammatory changes. Vascular/Lymphatic: Extensive aortic atherosclerosis. No enlarged abdominal or pelvic lymph nodes. Reproductive: Status post hysterectomy. No adnexal masses. Other: Moderate ascites is noted. A fat containing ventral abdominal wall hernia is not significantly changed since 10/17/2016. no evidence of retroperitoneal bleed. Musculoskeletal: Degenerative changes are seen in the spine. A chronic fracture is seen of the inferior left pubic ramus. No acute or significant osseous findings. IMPRESSION: 1. Gallbladder wall thickening with a small amount of pericholecystic fluid is suspicious for acute cholecystitis. No radiopaque gallstones or biliary dilatation. 2. Moderate ascites. Aortic Atherosclerosis (ICD10-I70.0). Electronically Signed: By: Zerita Boers M.D. On: 05/01/2019 15:33   CT Head Wo Contrast  Result Date: 05/01/2019 CLINICAL DATA:  Head and neck trauma after a fall. EXAM: CT HEAD WITHOUT CONTRAST CT CERVICAL SPINE WITHOUT CONTRAST TECHNIQUE: Multidetector CT imaging of the head and cervical spine was performed following the standard protocol without intravenous contrast. Multiplanar CT  image reconstructions of the cervical spine were also generated. COMPARISON:  CT head dated 07/09/2018 FINDINGS: CT HEAD FINDINGS Brain: No evidence of acute infarction, hemorrhage, hydrocephalus, extra-axial collection or mass lesion/mass effect. Periventricular white matter hypoattenuation likely represents chronic small vessel ischemic disease. Vascular: There are vascular calcifications in the carotid siphons. Skull: Normal. Negative for fracture or focal lesion. Sinuses/Orbits: Moderate to severe sinus disease in the bilateral maxillary, ethmoid, sphenoid, and frontal sinuses. Other: None. CT CERVICAL SPINE FINDINGS Alignment: Normal. Skull base and vertebrae: No acute fracture. No primary bone lesion or focal pathologic process. Soft tissues and spinal canal: No prevertebral fluid or swelling. No visible canal hematoma. Disc levels: Varying degrees of multilevel degenerative disc and joint disease are seen in the cervical spine. Upper chest: Negative. Other: None. IMPRESSION: 1. No acute intracranial process. 2. No acute osseous injury in the cervical spine. 3. Moderate to severe paranasal sinus disease. Electronically Signed   By: Zerita Boers M.D.   On: 05/01/2019 15:41   CT Cervical Spine Wo Contrast  Result Date: 05/01/2019 CLINICAL DATA:  Head and neck trauma after a fall. EXAM: CT HEAD WITHOUT CONTRAST CT CERVICAL SPINE  WITHOUT CONTRAST TECHNIQUE: Multidetector CT imaging of the head and cervical spine was performed following the standard protocol without intravenous contrast. Multiplanar CT image reconstructions of the cervical spine were also generated. COMPARISON:  CT head dated 07/09/2018 FINDINGS: CT HEAD FINDINGS Brain: No evidence of acute infarction, hemorrhage, hydrocephalus, extra-axial collection or mass lesion/mass effect. Periventricular white matter hypoattenuation likely represents chronic small vessel ischemic disease. Vascular: There are vascular calcifications in the carotid  siphons. Skull: Normal. Negative for fracture or focal lesion. Sinuses/Orbits: Moderate to severe sinus disease in the bilateral maxillary, ethmoid, sphenoid, and frontal sinuses. Other: None. CT CERVICAL SPINE FINDINGS Alignment: Normal. Skull base and vertebrae: No acute fracture. No primary bone lesion or focal pathologic process. Soft tissues and spinal canal: No prevertebral fluid or swelling. No visible canal hematoma. Disc levels: Varying degrees of multilevel degenerative disc and joint disease are seen in the cervical spine. Upper chest: Negative. Other: None. IMPRESSION: 1. No acute intracranial process. 2. No acute osseous injury in the cervical spine. 3. Moderate to severe paranasal sinus disease. Electronically Signed   By: Zerita Boers M.D.   On: 05/01/2019 15:41   DG Chest Portable 1 View  Result Date: 05/01/2019 CLINICAL DATA:  Decreased hemoglobin. EXAM: PORTABLE CHEST 1 VIEW COMPARISON:  Single-view of the chest 07/08/2018. FINDINGS: There is bilateral mid and lower lung zone airspace disease. No pneumothorax or pleural fluid. Cardiomegaly. Atherosclerosis is identified. Dialysis catheter is unchanged. IMPRESSION: Bilateral mid and lower lung zone airspace disease has an appearance most worrisome for pneumonia. Cardiomegaly. Atherosclerosis. Electronically Signed   By: Inge Rise M.D.   On: 05/01/2019 14:58   US Abdomen Limited RUQ  Result Date: 05/01/2019 CLINICAL DATA:  Cholelithiasis. Patient denies abdominal pain/symptoms. EXAM: ULTRASOUND ABDOMEN LIMITED RIGHT UPPER QUADRANT COMPARISON:  None. FINDINGS: Gallbladder: The gallbladder wall demonstrates a thickened edematous wall measuring up to 7.5 mm. Mobile stone is seen in the gallbladder. There are probably tiny stones within the sludge. No Murphy's sign identified. Common bile duct: Diameter: 2.8 mm Liver: Suggested nodular contour. No focal mass. Portal vein is patent on color Doppler imaging with normal direction of blood flow  towards the liver. Other: Ascites identified superiorly in the abdomen. IMPRESSION: 1. The gallbladder wall is thickened and edematous. Stones and sludge are seen in the gallbladder. No Murphy's sign noted. The findings are not specific. Acute cholecystitis not excluded on these images. If the clinical picture remains ambiguous, a HIDA scan could further evaluate. 2. The common bile duct is normal. 3. Suggested nodular contour to the liver raises the possibility of cirrhotic change. Recommend clinical correlation. No mass in the liver. Electronically Signed   By: Dorise Bullion III M.D   On: 05/01/2019 16:48    EKG: Independently reviewed.  Sinus rhythm, T wave inversions in leads I, aVL, V2 to V5   Assessment/Plan:   Principal Problem:   Severe anemia Active Problems:   Coronary artery disease involving left main coronary artery   ESRD (end stage renal disease) on dialysis ([place])   Pneumonia due to COVID-19 virus    Body mass index is 20.25 kg/m.   Severe anemia: Admit to MedSurg.  2 units of packed red blood cells have already been ordered by ED physician for blood transfusion.  Monitor H&H.  No clear etiology for severe anemia.  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  Hemoglobin was 11.9 on July 12, 2018.  Consulted gastroenterologist for endoscopic work-up of severe anemia  COVID-19  pneumonia with mild hypoxia: I was informed by the nephrologist that patient had a positive Covid test on 04/20/2019 at the skilled nursing home.  Therefore, patient with not been treated with IV remdesivir at this time.  Continue to monitor.  Treat with dexamethasone.  Oxygen via nasal cannula.  ESRD on hemodialysis: Consulted nephrologist, Dr. Juleen China, for management  Insulin-dependent diabetes mellitus: Hold Lantus for now since glucose is on the low side.  Use NovoLog as needed for hyperglycemia.  Seizure disorder: Continue Keppra  CAD/PVD: Hold aspirin and Eliquis because  of severe anemia  Hypertension: Continue antihypertensives  Gallstones/suspected cholecystitis: Asymptomatic.  Ultrasound of the liver has been ordered for further evaluation.    Other information:   DVT prophylaxis: SCD Code Status: Full code. Family Communication: Plan discussed with the patient Disposition Plan: Possible discharge to home in 2 days Consults called: Nephrologist Admission status: Inpatient  The medical decision making on this patient was of high complexity and the patient is at high risk for clinical deterioration, therefore this is a level 3 visit.  The medical decision making is of moderate complexity, therefore this is a level 2 visit.  Time spent 55 minutes  [facility]   How to contact the [facility] Attending or Consulting provider [place] or covering provider during after hours [place], for this patient?   1. Check the care team in [facility] and look for a) attending/consulting TRH provider listed and b) the [facility] team listed 2. Log into [email or web address] and use [place]'s universal password to access. If you do not have the password, please contact the hospital operator. 3. Locate the [facility] provider you are looking for under [facility] and page to a number that you can be directly reached. 4. If you still have difficulty reaching the provider, please page the [facility] (Director on Call) for the Hospitalists listed on amion for assistance.  05/01/2019, 4:57 PM

## 2019-05-01 NOTE — ED Provider Notes (Signed)
[facility] Emergency Department Provider Note  ____________________________________________   First MD Initiated Contact with Patient 05/01/19 1306     (approximate)  I have reviewed the triage vital signs and the nursing notes.   HISTORY  Chief Complaint Weakness    HPI Stephanie Ellis is a 69 y.o. female with ESRD on dialysis Tuesdays Thursdays Saturdays who comes in with low hemoglobin.  Patient had hemoglobin of 6.8.  She has some mild weakness, constant, nothing makes it better, nothing makes it worse.  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  She states that she did have a fall yesterday.  Maybe has a little bit of back discomfort.  Denies any bleeding that she knows of.          Past Medical History:  Diagnosis Date  . (HFpEF) heart failure with preserved ejection fraction ([place])    a. 01/2018 Echo: EF 50-55%, no rwma, Gr1 DD, triv AI, mild MR. Midly dil LA/RV, mod reduced RV fxn. Irreg thickening of TV w/ mobile echodensity that appears to arise from valve.  . Anemia   . Anorexia   . Bacteremia due to Pseudomonas   . Carotid arterial disease ([place])    a. 01/2017 Carotid U/S: RICA <56, LICA <25.  Marland Kitchen Complication of anesthesia    a. Pt reports h/o complication on 9 different occasions - ? hypotension/arrest.  . 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 01/2015 [facility]): 70% LM, p-mLAD 50-60% (Resting FFR 0.75), mRCA 80-90%, ~40 Ost OM & D1-->CABG; b. 01/2016 Staged PCI of LCX x 2 and RCA.  Marland Kitchen ESRD (end stage renal disease) on dialysis ([place])    a. ESRD secondary to acute kidney failure s/p CABG-->PD  . Essential hypertension   . GERD (gastroesophageal reflux disease)   . Heart murmur   . Hypercholesterolemia   . Myocardial infarction ([place]) 2016  . PVD (peripheral vascular disease) ([place])    a. 01/30/2018 PV Angio: Sev Left Popliteal dzs s/p PTA w/ 32m lutonix DEB w/ mech aspiration of the  L popliteal, L AT, and Left tibioperoneal trunck and peroneal artery.  . 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  [facility]: Dr. BMarland KitchenHaithcock: CABG x 3, LIMA to LAD, SVG to RCA, SVG to OM3, EVH  . Sinusitis 2019  . Type II diabetes mellitus with complication ([facility]    CAD    Patient Active Problem List   Diagnosis Date Noted  . Resides in skilled nursing facility 12/29/2018  . CVA (cerebral vascular accident) ([place] 07/08/2018  . Infection of amputation stump ([place] 05/02/2018  . BKA stump complication ([place] 163/89/3734 . Unilateral complete BKA, right, initial encounter ([place] 03/30/2018  . Transient loss of consciousness 03/19/2018  . Transient alteration of awareness   . VT (ventricular tachycardia) ([place]   . Disorientation   . Hypertensive urgency 03/08/2018  . Status post below-knee amputation of both lower extremities ([place] 03/02/2018  . Ischemia of extremity 02/18/2018  . CKD (chronic kidney disease) stage 5, GFR less than 15 ml/min (HCC) 02/16/2018  . Diabetic osteomyelitis ([place]   . Peripheral vascular disease ([place]   . Advanced care planning/counseling discussion   . Palliative care by specialist   . Goals of care, counseling/discussion   . Diabetes mellitus, type 2 ([place] 01/30/2018  . Sepsis (HR[place] 01/29/2018  . Peripheral neuropathy 06/17/2017  . Encephalopathy 02/06/2017  . Hypertensive emergency 02/06/2017  . Endophthalmitis, left eye 12/05/2016  .  Vomiting 10/24/2016  . Hematuria 10/17/2016  . Acute lower UTI 10/17/2016  . Anesthesia complication 81/19/1478  . Chest pain with moderate risk for cardiac etiology 09/04/2016  . Steal syndrome dialysis vascular access, initial encounter ([place]) 06/18/2016  . 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 Coronary artery disease involving coronary bypass graft of native heart with angina pectoris ([place]) 02/10/2016  . Hypertension 02/02/2016  . Hyperlipidemia 02/02/2016  . Coronary artery disease involving left main  coronary artery 10/04/2015  . Abdominal pain of unknown etiology 10/04/2015  . ESRD (end stage renal disease) on dialysis ([place])   . Type II diabetes mellitus with complication ([place])   . NSTEMI (non-ST elevated myocardial infarction) ([place])   . Right sided abdominal pain   . Elevated troponin 10/03/2015  . 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 Chronic diastolic congestive heart failure ([place]) 04/19/2014  . Anemia 09/20/2013  . Routine health maintenance 01/29/2013    Past Surgical History:  Procedure Laterality Date  . ABDOMINAL HYSTERECTOMY    . AMPUTATION Right 02/25/2018   Procedure: AMPUTATION BELOW KNEE;  Surgeon: Katha Cabal, MD;  Location: [facility] ORS;  Service: Vascular;  Laterality: Right;  . AMPUTATION Left 03/11/2018   Procedure: AMPUTATION BELOW KNEE;  Surgeon: Algernon Huxley, MD;  Location: [facility] ORS;  Service: Vascular;  Laterality: Left;  . AMPUTATION Bilateral 04/13/2018   Procedure: REVISION OF BILATERAL AMPUTATIONS BELOW KNEE WITH WOUND VAC APPLICATIONS;  Surgeon: Evaristo Bury, MD;  Location: [facility] ORS;  Service: Vascular;  Laterality: Bilateral;  . AMPUTATION TOE Left 02/06/2018   Procedure: AMPUTATION GREAT TOE;  Surgeon: Samara Deist, DPM;  Location: [facility] ORS;  Service: Podiatry;  Laterality: Left;  . APPLICATION OF WOUND VAC Left 04/13/2018   Procedure: APPLICATION OF WOUND VAC;  Surgeon: Evaristo Bury, MD;  Location: [facility] ORS;  Service: Vascular;  Laterality: Left;  . ARTERIOVENOUS GRAFT PLACEMENT  05/2016  . AV FISTULA PLACEMENT Left 05/30/2016   Procedure: ARTERIOVENOUS graft;  Surgeon: Algernon Huxley, MD;  Location: [facility] ORS;  Service: Vascular;  Laterality: Left;  . CAPD INSERTION N/A 10/30/2017   Procedure: LAPAROSCOPIC INSERTION CONTINUOUS AMBULATORY PERITONEAL DIALYSIS  (CAPD) CATHETER;  Surgeon: Algernon Huxley, MD;  Location: [facility] ORS;  Service: Vascular;  Laterality: N/A;  . CARDIAC CATHETERIZATION  01/2015   [facility]: Ost LM 70%, p-m LAD 50-60% (Rest FFR + @ 0.75), mRCA  80-90%, ostD1 40%, pOM1 40%  . CATARACT EXTRACTION W/PHACO Left 01/18/2016   Procedure: CATARACT EXTRACTION PHACO AND INTRAOCULAR LENS PLACEMENT (IOC);  Surgeon: Eulogio Bear, MD;  Location: [facility] ORS;  Service: Ophthalmology;  Laterality: Left;  [place] 1.05 AP% 15.5 CDE 10.16 Fluid Pack Lot # [id number]  . CATARACT EXTRACTION W/PHACO Right 08/01/2016   Procedure: CATARACT EXTRACTION PHACO AND INTRAOCULAR LENS PLACEMENT (IOC);  Surgeon: Eulogio Bear, MD;  Location: [facility] ORS;  Service: Ophthalmology;  Laterality: Right;  [place] 01:00.6 AP% 11.4 CDE 6.93  LOT # [id number]  . COLONOSCOPY    . CORONARY ANGIOPLASTY     SENTS 02/12/16  . CORONARY ARTERY BYPASS GRAFT  03/28/15    [facility]: Dr. Waldemar Dickens: LIMA to LAD, SVG to RCA, SVG to OM3, EVH  . DIALYSIS/PERMA CATHETER INSERTION N/A 02/11/2018   Procedure: DIALYSIS/PERMA CATHETER INSERTION;  Surgeon: Algernon Huxley, MD;  Location: [place] CV LAB;  Service: Cardiovascular;  Laterality: N/A;  . DIALYSIS/PERMA CATHETER REMOVAL Right 02/04/2018   Procedure: DIALYSIS/PERMA CATHETER REMOVAL;  Surgeon: Katha Cabal, MD;  Location[facility]  INVASIVE CV LAB;  Service: Cardiovascular;  Laterality: Right;  . ESOPHAGOGASTRODUODENOSCOPY (EGD) WITH PROPOFOL N/A 10/24/2016   Procedure: ESOPHAGOGASTRODUODENOSCOPY (EGD) WITH PROPOFOL;  Surgeon: [name], MD;  Location: [facility] ENDOSCOPY;  Service: Endoscopy;  Laterality: N/A;  . EYE SURGERY Bilateral    cataract surgery  . EYE SURGERY     drains for glaucoma  . INSERTION EXPRESS TUBE SHUNT Right 08/01/2016   Procedure: INSERTION EXPRESS TUBE SHUNT;  Surgeon: Eulogio Bear, MD;  Location: [facility] ORS;  Service: Ophthalmology;  Laterality: Right;  . INSERTION OF AHMED VALVE Left 08/15/2016   Procedure: INSERTION OF AHMED VALVE;  Surgeon: Eulogio Bear, MD;  Location: [facility] ORS;  Service: Ophthalmology;  Laterality: Left;  . INSERTION OF DIALYSIS CATHETER    . LOWER EXTREMITY ANGIOGRAPHY Right 02/19/2018    Procedure: Lower Extremity Angiography;  Surgeon: Algernon Huxley, MD;  Location: [place] CV LAB;  Service: Cardiovascular;  Laterality: Right;  . LOWER EXTREMITY ANGIOGRAPHY Left 01/30/2018   Procedure: Lower Extremity Angiography;  Surgeon: Katha Cabal, MD;  Location: [place] CV LAB;  Service: Cardiovascular;  Laterality: Left;  . REMOVAL OF A DIALYSIS CATHETER N/A 02/25/2018   Procedure: REMOVAL OF A DIALYSIS CATHETER;  Surgeon: Katha Cabal, MD;  Location: [facility] ORS;  Service: Vascular;  Laterality: N/A;  . TEMPORARY DIALYSIS CATHETER N/A 02/06/2018   Procedure: TEMPORARY DIALYSIS CATHETER;  Surgeon: Katha Cabal, MD;  Location: [place] CV LAB;  Service: Cardiovascular;  Laterality: N/A;  . TRANSTHORACIC ECHOCARDIOGRAM  January 2017    EF 60-65%. GR 2 DD. Mild degenerative mitral valve disease but no prolapse or regurgitation. Mild left atrial dilation. Mild to moderate LVH. Pericardial effusion gone  . UPPER EXTREMITY ANGIOGRAPHY Left 06/27/2016   Procedure: Upper Extremity Angiography;  Surgeon: Algernon Huxley, MD;  Location: [place] CV LAB;  Service: Cardiovascular;  Laterality: Left;  . UPPER EXTREMITY ANGIOGRAPHY Left 08/22/2016   Procedure: Upper Extremity Angiography;  Surgeon: Algernon Huxley, MD;  Location: [place] CV LAB;  Service: Cardiovascular;  Laterality: Left;  Marland Kitchen VASCULAR SURGERY    . WOUND DEBRIDEMENT Left 05/08/2018   Procedure: DEBRIDEMENT OF LEFT LEG BKA;  Surgeon: Algernon Huxley, MD;  Location: [facility] ORS;  Service: General;  Laterality: Left;    Prior to Admission medications   Medication Sig Start Date End Date Taking? Authorizing Provider  acetaminophen (TYLENOL) 325 MG tablet Take 2 tablets (650 mg total) by mouth every 6 (six) hours as needed for mild pain (or Fever >/= 101). Patient not taking: Reported on 04/02/2019 03/23/18   [name], MD  apixaban (ELIQUIS) 2.5 MG TABS tablet Take 1 tablet (2.5 mg total) by mouth 2 (two) times  daily. 02/15/18   Vaughan Basta, MD  aspirin EC 81 MG tablet Take 1 tablet (81 mg total) by mouth daily. 10/19/16   Dustin Flock, MD  atorvastatin (LIPITOR) 80 MG tablet Take 80 mg by mouth at bedtime.  05/05/17   [provider]  BETIMOL 0.5 % ophthalmic solution  10/12/18   [provider]  brimonidine (ALPHAGAN) 0.2 % ophthalmic solution Place 1 drop into both eyes 3 (three) times daily. 03/18/18   Bettey Costa, MD  carvedilol (COREG) 25 MG tablet Take 1 tablet (25 mg total) by mouth 2 (two) times daily. 07/13/18   Stark Jock Jude, MD  docusate sodium (COLACE) 100 MG capsule Take 1 capsule (100 mg total) by mouth 2 (two) times daily. 02/15/18   Vaughan Basta, MD  epoetin alfa (EPOGEN,PROCRIT) 24097 UNIT/ML injection Inject 1 mL (10,000 Units total) into the vein Every Tuesday,Thursday,and Saturday with dialysis. Patient not taking: Reported on 10/21/2018 03/24/18   [name], MD  famotidine (PEPCID) 20 MG tablet Take 20 mg by mouth 2 (two) times daily.    [provider]  hydrALAZINE (APRESOLINE) 25 MG tablet Take 1 tablet (25 mg total) by mouth 3 (three) times daily for 30 days. 07/13/18 12/18/18  Stark Jock Jude, MD  insulin glargine (LANTUS) 100 UNIT/ML injection Inject 2 Units into the skin at bedtime.    [provider]  isosorbide mononitrate (IMDUR) 30 MG 24 hr tablet Take 30 mg by mouth daily.     [provider]  levETIRAcetam (KEPPRA) 250 MG tablet Three times weekly after dialysis 05/12/18   Loletha Grayer, MD  levETIRAcetam (KEPPRA) 500 MG tablet Take 1 tablet (500 mg total) by mouth 2 (two) times daily. 03/23/18   Gouru, Illene Silver, MD  mirtazapine (REMERON) 7.5 MG tablet Take 7.5 mg by mouth daily.  12/18/17   [provider]  multivitamin (RENA-VIT) TABS tablet Take 1 tablet by mouth at bedtime. Patient not taking: Reported on 10/21/2018 05/12/18   Loletha Grayer, MD  nitroGLYCERIN (NITROSTAT) 0.4 MG SL tablet Place 1 tablet (0.4  mg total) under the tongue every 5 (five) minutes as needed for chest pain. 09/05/16   Loletha Grayer, MD  Nutritional Supplements (FEEDING SUPPLEMENT, NEPRO CARB STEADY,) LIQD Take 237 mLs by mouth 2 (two) times daily between meals. 05/12/18   Loletha Grayer, MD  Omega-3 1000 MG CAPS Take 2 capsules by mouth daily.     [provider]  ondansetron (ZOFRAN) 4 MG tablet Take 4 mg by mouth every 8 (eight) hours as needed for nausea or vomiting.    [provider]  Polyethyl Glycol-Propyl Glycol 0.4-0.3 % SOLN Place 1 drop into both eyes 2 (two) times daily.    [provider]  Polyvinyl Alcohol-Povidone PF (REFRESH) 1.4-0.6 % SOLN Apply to eye 2 (two) times a day.    [provider]  rOPINIRole (REQUIP) 0.25 MG tablet Take 1 tablet (0.25 mg total) by mouth at bedtime. 05/12/18   Loletha Grayer, MD  sevelamer (RENAGEL) 800 MG tablet  01/07/19   [provider]  sevelamer carbonate (RENVELA) 800 MG tablet Take 1,600 mg by mouth 3 (three) times daily with meals.     [provider]  timolol (TIMOPTIC) 0.5 % ophthalmic solution Place 1 drop into both eyes 3 (three) times daily. Patient not taking: Reported on 10/21/2018 03/18/18   Bettey Costa, MD  traMADol Veatrice [place]bon) 50 MG tablet  08/27/18   [provider]  vitamin B-12 (CYANOCOBALAMIN) 1000 MCG tablet Take 1 tablet (1,000 mcg total) by mouth daily. Patient not taking: Reported on 04/02/2019 02/15/18   Vaughan Basta, MD  vitamin C (VITAMIN C) 500 MG tablet Take 1 tablet (500 mg total) by mouth 2 (two) times daily. Patient not taking: Reported on 04/02/2019 05/12/18   Loletha Grayer, MD    Allergies Chlorthalidone, Fentanyl, Midazolam, Ace inhibitors, Angiotensin receptor blockers, Norvasc [amlodipine], and Phenergan [promethazine hcl]  Family History  Problem Relation Age of Onset  . Diabetes Mellitus II Mother   . Heart failure Mother   . Pancreatic cancer Father     Social  History Social History   Tobacco Use  . Smoking status: Never Smoker  . Smokeless tobacco: Never Used  Substance Use Topics  . Alcohol use: No  . Drug use:  No      Review of Systems Constitutional: No fever/chills, positive weakness Eyes: No visual changes. ENT: No sore throat. Cardiovascular: Denies chest pain. Respiratory: Denies shortness of breath. Gastrointestinal: No abdominal pain.  No nausea, no vomiting.  No diarrhea.  No constipation. Genitourinary: Negative for dysuria. Musculoskeletal: Positive back pain Skin: Negative for rash. Neurological: Negative for headaches, focal weakness or numbness. All other ROS negative ____________________________________________   PHYSICAL EXAM:  VITAL SIGNS: Blood pressure (!) 111/46, pulse (!) 53, temperature 98.6 F (37 C), temperature source Oral, resp. rate 18, height 5' 4"  (1.626 m), weight 53.5 kg, SpO2 94 %.  Constitutional: Alert and oriented. Well appearing and in no acute distress. Eyes: Conjunctivae are normal. EOMI. Head: Atraumatic. Nose: No congestion/rhinnorhea. Mouth/Throat: Mucous membranes are moist.   Neck: No stridor. Trachea Midline. FROM Cardiovascular: Normal rate, regular rhythm. Grossly normal heart sounds.  Good peripheral circulation.  Dialysis catheter on her left chest wall Respiratory: Normal respiratory effort.  No retractions. Lungs CTAB. Gastrointestinal: Soft and nontender. No distention. No abdominal bruits.  Musculoskeletal: Bilateral BKA.  No joint effusions. Neurologic:  Normal speech and language. No gross focal neurologic deficits are appreciated.  Skin:  Skin is warm, dry and intact. No rash noted. Psychiatric: Mood and affect are normal. Speech and behavior are normal. GU: Deferred   ____________________________________________   LABS (all labs ordered are listed, but only abnormal results are displayed)  Labs Reviewed  BASIC METABOLIC PANEL - Abnormal; Notable for the  following components:      Result Value   Chloride 95 (*)    Glucose, Bld 101 (*)    BUN 32 (*)    Creatinine, Ser 5.76 (*)    Calcium 7.7 (*)    GFR calc non Af Amer 7 (*)    GFR calc Af Amer 8 (*)    All other components within normal limits  CBC - Abnormal; Notable for the following components:   RBC 2.10 (*)    Hemoglobin 5.9 (*)    HCT 20.6 (*)    MCHC 28.6 (*)    RDW 19.1 (*)    Platelets 133 (*)    nRBC 0.8 (*)    All other components within normal limits  APTT - Abnormal; Notable for the following components:   aPTT 39 (*)    All other components within normal limits  PROTIME-INR - Abnormal; Notable for the following components:   Prothrombin Time 17.1 (*)    INR 1.4 (*)    All other components within normal limits  RESPIRATORY PANEL BY RT PCR (FLU A&B, COVID)  URINALYSIS, COMPLETE (UACMP) WITH MICROSCOPIC  FERRITIN  IRON AND TIBC  RETICULOCYTES  LACTATE DEHYDROGENASE  TYPE AND SCREEN  PREPARE RBC (CROSSMATCH)   ____________________________________________   ED ECG REPORT I, Vanessa [place], the attending physician, personally viewed and interpreted this ECG.  EKG is sinus rate of 50, no ST elevation, T wave inversions in 2, aVL, V2 through V5 ____________________________________________  RADIOLOGY I, Vanessa [place], personally viewed and evaluated these images (plain radiographs) as part of my medical decision making, as well as reviewing the written report by the radiologist.  ED MD interpretation:  pending  Official radiology report(s): No results found.  ____________________________________________   PROCEDURES  Procedure(s) performed (including Critical Care):  .Critical Care Performed by: Vanessa [place], MD Authorized by: Vanessa [place], MD   Critical care provider statement:    Critical care time (minutes):  35   Critical  care was time spent personally by me on the following activities:  Discussions with consultants, evaluation of patient's  response to treatment, examination of patient, ordering and performing treatments and interventions, ordering and review of laboratory studies, ordering and review of radiographic studies, pulse oximetry, re-evaluation of patient's condition, obtaining history from patient or surrogate and review of old charts     ____________________________________________   INITIAL IMPRESSION / [place] / ED COURSE  Stephanie Ellis was evaluated in Emergency Department on 05/01/2019 for the symptoms described in the history of present illness. She was evaluated in the context of the global COVID-19 pandemic, which necessitated consideration that the patient might be at risk for infection with the SARS-CoV-2 virus that causes COVID-19. Institutional protocols and algorithms that pertain to the evaluation of patients at risk for COVID-19 are in a state of rapid change based on information released by regulatory bodies including the CDC and federal and state organizations. These policies and algorithms were followed during the patient's care in the ED.    Patient is a 69 year old who comes in with low hemoglobin levels.  Possibly secondary to dialysis however last hemoglobin check that we found was a few months ago and was around 11.  Given patient's fall will get CT scan to make sure there is no evidence of retroperitoneal hemorrhage.  Rectal exam was Hemoccult negative and brown stool.  Will get labs to evaluate for iron.    Pt handed off pending CT scans and admission for workup new anemia.       ____________________________________________   FINAL CLINICAL IMPRESSION(S) / ED DIAGNOSES   Final diagnoses:  Cholelithiasis  Low hemoglobin  Anemia, unspecified type      MEDICATIONS GIVEN DURING THIS VISIT:  Medications  0.9 %  sodium chloride infusion (10 mL/hr Intravenous New Bag/Given 05/01/19 1759)     ED Discharge Orders    None       Note:  This document was prepared using  Dragon voice recognition software and may include unintentional dictation errors.   Vanessa [place], MD 05/01/19 [id number]

## 2019-05-01 NOTE — Progress Notes (Addendum)
Remdesivir - Pharmacy Brief Note   O:  ALT: pending  CXR: Bilateral mid and lower lung zone airspace disease has an appearance most worrisome for pneumonia.  SpO2: 100% on [place] 2L   A/P:  Patient diagnosed at nursing care facility with COVID on 12/29. Remdesivir held at this time.   Currie Paris, RPh 05/01/2019 5:18 PM

## 2019-05-01 NOTE — Progress Notes (Signed)
[facility]  ROUNDING NOTE   Subjective:   [name] admitted to [facility] on 05/01/2019 for Severe anemia [D64.9]  Found to have COVID-19 positive on 12/29. Was in quarentine at her nursing home.   Last hemodialysis treatment was Thursday 1/7.   Objective:  Vital signs in last 24 hours:  Temp:  [98.6 F (37 C)] 98.6 F (37 C) (01/09 1321) Pulse Rate:  [52-55] 52 (01/09 1600) Resp:  [10-18] 16 (01/09 1630) BP: (111-156)/(45-71) 145/55 (01/09 1630) SpO2:  [94 %-100 %] 100 % (01/09 1600) Weight:  [53.5 kg] 53.5 kg (01/09 1313)  Weight change:  Filed Weights   05/01/19 1313  Weight: 53.5 kg    Intake/Output: No intake/output data recorded.   Intake/Output this shift:  No intake/output data recorded.  Physical Exam: General: NAD,   Head: Normocephalic, atraumatic. Moist oral mucosal membranes  Eyes: Anicteric, PERRL  Neck: Supple, trachea midline  Lungs:  Clear to auscultation  Heart: Regular rate and rhythm  Abdomen:  Soft, nontender,   Extremities:  bilateral BKA  Neurologic: Nonfocal, moving all four extremities  Skin: No lesions  Access: permcath    Basic Metabolic Panel: Recent Labs  Lab 05/01/19 1321  NA 138  K 3.6  CL 95*  CO2 28  GLUCOSE 101*  BUN 32*  CREATININE 5.76*  CALCIUM 7.7*    Liver Function Tests: No results for input(s): AST, ALT, ALKPHOS, BILITOT, PROT, ALBUMIN in the last 168 hours. No results for input(s): LIPASE, AMYLASE in the last 168 hours. No results for input(s): AMMONIA in the last 168 hours.  CBC: Recent Labs  Lab 05/01/19 1321  WBC 4.0  HGB 5.9*  HCT 20.6*  MCV 98.1  PLT 133*    Cardiac Enzymes: No results for input(s): CKTOTAL, CKMB, CKMBINDEX, TROPONINI in the last 168 hours.  BNP: Invalid input(s): POCBNP  CBG: No results for input(s): GLUCAP in the last 168 hours.  Microbiology: Results for orders placed or performed during the hospital encounter of 05/01/19  Respiratory Panel by RT  PCR (Flu A&B, Covid) - Nasopharyngeal Swab     Status: Abnormal   Collection Time: 05/01/19  3:16 PM   Specimen: Nasopharyngeal Swab  Result Value Ref Range Status   SARS Coronavirus 2 by RT PCR POSITIVE (A) NEGATIVE Final    Comment: RESULT CALLED TO, READ BACK BY AND VERIFIED WITH: JENNIFER WHITLEY AT 1622 05/01/19.PMF (NOTE) SARS-CoV-2 target nucleic acids are DETECTED. SARS-CoV-2 RNA is generally detectable in upper respiratory specimens  during the acute phase of infection. Positive results are indicative of the presence of the identified virus, but do not rule out bacterial infection or co-infection with other pathogens not detected by the test. Clinical correlation with patient history and other diagnostic information is necessary to determine patient infection status. The expected result is Negative. Fact Sheet for Patients:  PinkCheek.be Fact Sheet for Healthcare Providers: GravelBags.it This test is not yet approved or cleared by the [place] FDA and  has been authorized for detection and/or diagnosis of SARS-CoV-2 by FDA under an Emergency Use Authorization (EUA).  This EUA will remain in effect (meaning this test can be used) f or the duration of  the COVID-19 declaration under Section 564(b)(1) of the Act, 21 U.S.C. section 360bbb-3(b)(1), unless the authorization is terminated or revoked sooner.    Influenza A by PCR NEGATIVE NEGATIVE Final   Influenza B by PCR NEGATIVE NEGATIVE Final    Comment: (NOTE) The Xpert Xpress SARS-CoV-2/FLU/RSV assay is  intended as an aid in  the diagnosis of influenza from Nasopharyngeal swab specimens and  should not be used as a sole basis for treatment. Nasal washings and  aspirates are unacceptable for Xpert Xpress SARS-CoV-2/FLU/RSV  testing. Fact Sheet for Patients: PinkCheek.be Fact Sheet for Healthcare  Providers: GravelBags.it This test is not yet approved or cleared by the [place] FDA and  has been authorized for detection and/or diagnosis of SARS-CoV-2 by  FDA under an Emergency Use Authorization (EUA). This EUA will remain  in effect (meaning this test can be used) for the duration of the  Covid-19 declaration under Section 564(b)(1) of the Act, 21  U.S.C. section 360bbb-3(b)(1), unless the authorization is  terminated or revoked. Performed at [facility], [address] [place], [place] [address]     Coagulation Studies: Recent Labs    05/01/19 1325  LABPROT 17.1*  INR 1.4*    Urinalysis: No results for input(s): COLORURINE, LABSPEC, PHURINE, GLUCOSEU, HGBUR, BILIRUBINUR, KETONESUR, PROTEINUR, UROBILINOGEN, NITRITE, LEUKOCYTESUR in the last 72 hours.  Invalid input(s): APPERANCEUR    Imaging: CT ABDOMEN PELVIS WO CONTRAST  Addendum Date: 05/01/2019   ADDENDUM REPORT: 05/01/2019 15:43 ADDENDUM: The FINDINGS and RECOMMENDATION incorrectly state that no radiopaque gallstones are identified. Small gallstones/gallbladder sludge are seen layering dependently within the gallbladder. There remains no evidence of biliary dilatation. Electronically Signed   By: Zerita Boers M.D.   On: 05/01/2019 15:43   Result Date: 05/01/2019 CLINICAL DATA:  Status post trauma and mid back pain. Concern for retroperitoneal bleed. Patient is anemic. EXAM: CT ABDOMEN AND PELVIS WITHOUT CONTRAST TECHNIQUE: Multidetector CT imaging of the abdomen and pelvis was performed following the standard protocol without IV contrast. COMPARISON:  CT abdomen pelvis dated 10/17/2016 FINDINGS: Lower chest: Calcified pleural scarring is seen in the lung bases, unchanged. There is bibasilar atelectasis. The heart is enlarged. Hepatobiliary: No focal liver abnormality is seen. The gallbladder wall is thickened with a small amount of pericholecystic fluid. No radiopaque gallstones  or biliary dilatation. Pancreas: Unremarkable. No pancreatic ductal dilatation or surrounding inflammatory changes. Spleen: Normal in size without focal abnormality. Adrenals/Urinary Tract: Adrenal glands are unremarkable. Kidneys are normal, without renal calculi, focal lesion, or hydronephrosis. A 7 mm calculus is seen in the upper right ureter. There is no hydroureter on either side. Bladder is incompletely distended. Stomach/Bowel: Stomach is within normal limits. Appendix appears normal. There is colonic diverticulosis without evidence of diverticulitis. No evidence of bowel wall thickening, distention, or inflammatory changes. Vascular/Lymphatic: Extensive aortic atherosclerosis. No enlarged abdominal or pelvic lymph nodes. Reproductive: Status post hysterectomy. No adnexal masses. Other: Moderate ascites is noted. A fat containing ventral abdominal wall hernia is not significantly changed since 10/17/2016. no evidence of retroperitoneal bleed. Musculoskeletal: Degenerative changes are seen in the spine. A chronic fracture is seen of the inferior left pubic ramus. No acute or significant osseous findings. IMPRESSION: 1. Gallbladder wall thickening with a small amount of pericholecystic fluid is suspicious for acute cholecystitis. No radiopaque gallstones or biliary dilatation. 2. Moderate ascites. Aortic Atherosclerosis (ICD10-I70.0). Electronically Signed: By: Zerita Boers M.D. On: 05/01/2019 15:33   CT Head Wo Contrast  Result Date: 05/01/2019 CLINICAL DATA:  Head and neck trauma after a fall. EXAM: CT HEAD WITHOUT CONTRAST CT CERVICAL SPINE WITHOUT CONTRAST TECHNIQUE: Multidetector CT imaging of the head and cervical spine was performed following the standard protocol without intravenous contrast. Multiplanar CT image reconstructions of the cervical spine were also generated. COMPARISON:  CT head dated 07/09/2018  FINDINGS: CT HEAD FINDINGS Brain: No evidence of acute infarction, hemorrhage,  hydrocephalus, extra-axial collection or mass lesion/mass effect. Periventricular white matter hypoattenuation likely represents chronic small vessel ischemic disease. Vascular: There are vascular calcifications in the carotid siphons. Skull: Normal. Negative for fracture or focal lesion. Sinuses/Orbits: Moderate to severe sinus disease in the bilateral maxillary, ethmoid, sphenoid, and frontal sinuses. Other: None. CT CERVICAL SPINE FINDINGS Alignment: Normal. Skull base and vertebrae: No acute fracture. No primary bone lesion or focal pathologic process. Soft tissues and spinal canal: No prevertebral fluid or swelling. No visible canal hematoma. Disc levels: Varying degrees of multilevel degenerative disc and joint disease are seen in the cervical spine. Upper chest: Negative. Other: None. IMPRESSION: 1. No acute intracranial process. 2. No acute osseous injury in the cervical spine. 3. Moderate to severe paranasal sinus disease. Electronically Signed   By: Zerita Boers M.D.   On: 05/01/2019 15:41   CT Cervical Spine Wo Contrast  Result Date: 05/01/2019 CLINICAL DATA:  Head and neck trauma after a fall. EXAM: CT HEAD WITHOUT CONTRAST CT CERVICAL SPINE WITHOUT CONTRAST TECHNIQUE: Multidetector CT imaging of the head and cervical spine was performed following the standard protocol without intravenous contrast. Multiplanar CT image reconstructions of the cervical spine were also generated. COMPARISON:  CT head dated 07/09/2018 FINDINGS: CT HEAD FINDINGS Brain: No evidence of acute infarction, hemorrhage, hydrocephalus, extra-axial collection or mass lesion/mass effect. Periventricular white matter hypoattenuation likely represents chronic small vessel ischemic disease. Vascular: There are vascular calcifications in the carotid siphons. Skull: Normal. Negative for fracture or focal lesion. Sinuses/Orbits: Moderate to severe sinus disease in the bilateral maxillary, ethmoid, sphenoid, and frontal sinuses. Other:  None. CT CERVICAL SPINE FINDINGS Alignment: Normal. Skull base and vertebrae: No acute fracture. No primary bone lesion or focal pathologic process. Soft tissues and spinal canal: No prevertebral fluid or swelling. No visible canal hematoma. Disc levels: Varying degrees of multilevel degenerative disc and joint disease are seen in the cervical spine. Upper chest: Negative. Other: None. IMPRESSION: 1. No acute intracranial process. 2. No acute osseous injury in the cervical spine. 3. Moderate to severe paranasal sinus disease. Electronically Signed   By: Zerita Boers M.D.   On: 05/01/2019 15:41   DG Chest Portable 1 View  Result Date: 05/01/2019 CLINICAL DATA:  Decreased hemoglobin. EXAM: PORTABLE CHEST 1 VIEW COMPARISON:  Single-view of the chest 07/08/2018. FINDINGS: There is bilateral mid and lower lung zone airspace disease. No pneumothorax or pleural fluid. Cardiomegaly. Atherosclerosis is identified. Dialysis catheter is unchanged. IMPRESSION: Bilateral mid and lower lung zone airspace disease has an appearance most worrisome for pneumonia. Cardiomegaly. Atherosclerosis. Electronically Signed   By: Inge Rise M.D.   On: 05/01/2019 14:58   US Abdomen Limited RUQ  Result Date: 05/01/2019 CLINICAL DATA:  Cholelithiasis. Patient denies abdominal pain/symptoms. EXAM: ULTRASOUND ABDOMEN LIMITED RIGHT UPPER QUADRANT COMPARISON:  None. FINDINGS: Gallbladder: The gallbladder wall demonstrates a thickened edematous wall measuring up to 7.5 mm. Mobile stone is seen in the gallbladder. There are probably tiny stones within the sludge. No Murphy's sign identified. Common bile duct: Diameter: 2.8 mm Liver: Suggested nodular contour. No focal mass. Portal vein is patent on color Doppler imaging with normal direction of blood flow towards the liver. Other: Ascites identified superiorly in the abdomen. IMPRESSION: 1. The gallbladder wall is thickened and edematous. Stones and sludge are seen in the gallbladder. No  Murphy's sign noted. The findings are not specific. Acute cholecystitis not excluded on these images. If the  clinical picture remains ambiguous, a HIDA scan could further evaluate. 2. The common bile duct is normal. 3. Suggested nodular contour to the liver raises the possibility of cirrhotic change. Recommend clinical correlation. No mass in the liver. Electronically Signed   By: Dorise Bullion III M.D   On: 05/01/2019 16:48     Medications:   . sodium chloride    . remdesivir 200 mg in sodium chloride 0.9% 250 mL IVPB     Followed by  . [START ON 05/02/2019] remdesivir 100 mg in NS 100 mL        Assessment/ Plan:  [name] is a 4 y.o. black female with end stage renal disease on hemodialysis, hypertension, peripheral vascular disease, bilateral below the knee amputations, coronary artery disease status post CABG, diabetes mellitus type II insulin dependent, history of endocarditis, congestive heart failure who is admitted to [facility] on 05/01/2019 for Severe anemia [D64.9]  [facility] TTS [facility] [address] permcath  1. End Stage Renal Disease on hemodialysis: missed dialysis today. No acute indication for dialysis tonight. Will reasses in the morning.   2. Anemia with renal failure: hemoglobin 5.9. Concern for GI bleed Scheduled for PRBC blood transfusion  - EPO with HD treatments.   3. Hypertension: 145/55. Home regimen of carvedilol and hydralazine.  4. Secondary Hyperparathyroidism - sevelamer   LOS: 0 Elese Rane 1/9/20215:35 PM

## 2019-05-01 NOTE — ED Provider Notes (Signed)
CT head/CS: IMPRESSION:  1. No acute intracranial process.  2. No acute osseous injury in the cervical spine.  3. Moderate to severe paranasal sinus disease.   CT A/P: IMPRESSION:  1. Gallbladder wall thickening with a small amount of pericholecystic fluid is suspicious for acute cholecystitis. No radiopaque gallstones or biliary dilatation.  2. Moderate ascites.   ADDENDUM: The FINDINGS and RECOMMENDATION incorrectly state that no radiopaque gallstones are identified. Small gallstones/gallbladder sludge areseen layering dependently within the gallbladder. There remains no evidence of biliary dilatation.  CT scans as above, no evidence of retroperitoneal bleeding, however noted findings with regards to her gallbladder.  Reexamined patient, she denies any abdominal pain at present or any episodes of postprandial abdominal pain. On exam she is completely nontender.  Will obtain an ultrasound and HFP to follow-up.  Hospitalist messaged for update, they will admit.  Patient noted to be COVID positive.   Lilia Pro., MD 05/01/19 2029

## 2019-05-01 NOTE — ED Notes (Signed)
Pt repositioned in bed and given meal tray and water at this time

## 2019-05-02 DIAGNOSIS — N186 End stage renal disease: Secondary | ICD-10-CM

## 2019-05-02 DIAGNOSIS — K7469 Other cirrhosis of liver: Secondary | ICD-10-CM

## 2019-05-02 DIAGNOSIS — U071 COVID-19: Secondary | ICD-10-CM

## 2019-05-02 DIAGNOSIS — D649 Anemia, unspecified: Secondary | ICD-10-CM

## 2019-05-02 DIAGNOSIS — I2510 Atherosclerotic heart disease of native coronary artery without angina pectoris: Secondary | ICD-10-CM

## 2019-05-02 DIAGNOSIS — Z992 Dependence on renal dialysis: Secondary | ICD-10-CM

## 2019-05-02 DIAGNOSIS — J1282 Pneumonia due to coronavirus disease 2019: Secondary | ICD-10-CM

## 2019-05-02 DIAGNOSIS — I739 Peripheral vascular disease, unspecified: Secondary | ICD-10-CM

## 2019-05-02 LAB — BPAM RBC
Blood Product Expiration Date: 202102012359
Blood Product Expiration Date: 202102102359
ISSUE DATE / TIME: 202101091744
ISSUE DATE / TIME: 202101092207
Unit Type and Rh: 1700
Unit Type and Rh: 7300

## 2019-05-02 LAB — CBC
HCT: 31.5 % — ABNORMAL LOW (ref 36.0–46.0)
Hemoglobin: 10.1 g/dL — ABNORMAL LOW (ref 12.0–15.0)
MCH: 29.5 pg (ref 26.0–34.0)
MCHC: 32.1 g/dL (ref 30.0–36.0)
MCV: 92.1 fL (ref 80.0–100.0)
Platelets: 129 10*3/uL — ABNORMAL LOW (ref 150–400)
RBC: 3.42 MIL/uL — ABNORMAL LOW (ref 3.87–5.11)
RDW: 17.7 % — ABNORMAL HIGH (ref 11.5–15.5)
WBC: 4.6 10*3/uL (ref 4.0–10.5)
nRBC: 0.4 % — ABNORMAL HIGH (ref 0.0–0.2)

## 2019-05-02 LAB — TYPE AND SCREEN
ABO/RH(D): B POS
Antibody Screen: NEGATIVE
Unit division: 0
Unit division: 0

## 2019-05-02 LAB — CBC WITH DIFFERENTIAL/PLATELET
Abs Immature Granulocytes: 0.04 10*3/uL (ref 0.00–0.07)
Basophils Absolute: 0 10*3/uL (ref 0.0–0.1)
Basophils Relative: 0 %
Eosinophils Absolute: 0.1 10*3/uL (ref 0.0–0.5)
Eosinophils Relative: 1 %
HCT: 29.3 % — ABNORMAL LOW (ref 36.0–46.0)
Hemoglobin: 9.3 g/dL — ABNORMAL LOW (ref 12.0–15.0)
Immature Granulocytes: 1 %
Lymphocytes Relative: 8 %
Lymphs Abs: 0.4 10*3/uL — ABNORMAL LOW (ref 0.7–4.0)
MCH: 29.7 pg (ref 26.0–34.0)
MCHC: 31.7 g/dL (ref 30.0–36.0)
MCV: 93.6 fL (ref 80.0–100.0)
Monocytes Absolute: 0.2 10*3/uL (ref 0.1–1.0)
Monocytes Relative: 3 %
Neutro Abs: 4.1 10*3/uL (ref 1.7–7.7)
Neutrophils Relative %: 87 %
Platelets: 127 10*3/uL — ABNORMAL LOW (ref 150–400)
RBC: 3.13 MIL/uL — ABNORMAL LOW (ref 3.87–5.11)
RDW: 18.1 % — ABNORMAL HIGH (ref 11.5–15.5)
WBC: 4.8 10*3/uL (ref 4.0–10.5)
nRBC: 0.6 % — ABNORMAL HIGH (ref 0.0–0.2)

## 2019-05-02 LAB — FIBRINOGEN: Fibrinogen: 446 mg/dL (ref 210–475)

## 2019-05-02 LAB — COMPREHENSIVE METABOLIC PANEL
ALT: 21 U/L (ref 0–44)
AST: 26 U/L (ref 15–41)
Albumin: 2.8 g/dL — ABNORMAL LOW (ref 3.5–5.0)
Alkaline Phosphatase: 95 U/L (ref 38–126)
Anion gap: 19 — ABNORMAL HIGH (ref 5–15)
BUN: 37 mg/dL — ABNORMAL HIGH (ref 8–23)
CO2: 25 mmol/L (ref 22–32)
Calcium: 7.4 mg/dL — ABNORMAL LOW (ref 8.9–10.3)
Chloride: 94 mmol/L — ABNORMAL LOW (ref 98–111)
Creatinine, Ser: 6.71 mg/dL — ABNORMAL HIGH (ref 0.44–1.00)
GFR calc Af Amer: 7 mL/min — ABNORMAL LOW (ref >60)
GFR calc non Af Amer: 6 mL/min — ABNORMAL LOW (ref >60)
Glucose, Bld: 100 mg/dL — ABNORMAL HIGH (ref 70–99)
Potassium: 3.7 mmol/L (ref 3.5–5.1)
Sodium: 138 mmol/L (ref 135–145)
Total Bilirubin: 1 mg/dL (ref 0.3–1.2)
Total Protein: 6.8 g/dL (ref 6.5–8.1)

## 2019-05-02 LAB — HEPATITIS B SURFACE ANTIBODY,QUALITATIVE: Hep B S Ab: REACTIVE — AB

## 2019-05-02 LAB — C-REACTIVE PROTEIN: CRP: 8.4 mg/dL — ABNORMAL HIGH (ref <1.0)

## 2019-05-02 LAB — HEMOGLOBIN AND HEMATOCRIT, BLOOD
HCT: 29.8 % — ABNORMAL LOW (ref 36.0–46.0)
Hemoglobin: 9.5 g/dL — ABNORMAL LOW (ref 12.0–15.0)

## 2019-05-02 LAB — HEPATITIS B SURFACE ANTIGEN: Hepatitis B Surface Ag: NONREACTIVE

## 2019-05-02 LAB — VITAMIN B12: Vitamin B-12: >7500 pg/mL — ABNORMAL HIGH (ref 180–914)

## 2019-05-02 LAB — FOLATE: Folate: 19.7 ng/mL (ref >5.9)

## 2019-05-02 LAB — MRSA PCR SCREENING: MRSA by PCR: POSITIVE — AB

## 2019-05-02 LAB — HEPATITIS B CORE ANTIBODY, TOTAL: Hep B Core Total Ab: NONREACTIVE

## 2019-05-02 LAB — FIBRIN DERIVATIVES D-DIMER (ARMC ONLY): Fibrin derivatives D-dimer (ARMC): 2067.62 ng/mL (FEU) — ABNORMAL HIGH (ref 0.00–499.00)

## 2019-05-02 LAB — HIV ANTIBODY (ROUTINE TESTING W REFLEX): HIV Screen 4th Generation wRfx: NONREACTIVE

## 2019-05-02 LAB — GLUCOSE, CAPILLARY: Glucose-Capillary: 133 mg/dL — ABNORMAL HIGH (ref 70–99)

## 2019-05-02 LAB — PROCALCITONIN: Procalcitonin: 0.94 ng/mL

## 2019-05-02 MED ORDER — CARVEDILOL 25 MG PO TABS
25.0000 mg | ORAL_TABLET | Freq: Two times a day (BID) | ORAL | Status: DC
  Administered 2019-05-02 – 2019-05-03 (×3): 25 mg via ORAL
  Filled 2019-05-02 (×3): qty 1

## 2019-05-02 MED ORDER — ATORVASTATIN CALCIUM 20 MG PO TABS
80.0000 mg | ORAL_TABLET | Freq: Every day | ORAL | Status: DC
  Administered 2019-05-02 – 2019-05-03 (×3): 80 mg via ORAL
  Filled 2019-05-02 (×3): qty 4

## 2019-05-02 MED ORDER — VITAMIN B-12 1000 MCG PO TABS
1000.0000 ug | ORAL_TABLET | Freq: Every day | ORAL | Status: DC
  Administered 2019-05-02 – 2019-05-03 (×2): 1000 ug via ORAL
  Filled 2019-05-02 (×3): qty 1

## 2019-05-02 MED ORDER — FAMOTIDINE 20 MG PO TABS
10.0000 mg | ORAL_TABLET | Freq: Every day | ORAL | Status: DC
  Administered 2019-05-03 – 2019-05-04 (×2): 10 mg via ORAL
  Filled 2019-05-02 (×2): qty 1

## 2019-05-02 MED ORDER — ROPINIROLE HCL 0.25 MG PO TABS
0.2500 mg | ORAL_TABLET | Freq: Every day | ORAL | Status: DC
  Administered 2019-05-02 – 2019-05-03 (×3): 0.25 mg via ORAL
  Filled 2019-05-02 (×4): qty 1

## 2019-05-02 MED ORDER — CHLORHEXIDINE GLUCONATE CLOTH 2 % EX PADS
6.0000 | MEDICATED_PAD | Freq: Every day | CUTANEOUS | Status: DC
  Administered 2019-05-02 – 2019-05-03 (×2): 6 via TOPICAL

## 2019-05-02 MED ORDER — SEVELAMER CARBONATE 800 MG PO TABS
1600.0000 mg | ORAL_TABLET | Freq: Three times a day (TID) | ORAL | Status: DC
  Administered 2019-05-02 – 2019-05-04 (×7): 1600 mg via ORAL
  Filled 2019-05-02 (×9): qty 2

## 2019-05-02 MED ORDER — TRAMADOL HCL 50 MG PO TABS
50.0000 mg | ORAL_TABLET | Freq: Three times a day (TID) | ORAL | Status: DC | PRN
  Administered 2019-05-02 – 2019-05-04 (×2): 50 mg via ORAL
  Filled 2019-05-02 (×2): qty 1

## 2019-05-02 MED ORDER — POLYVINYL ALCOHOL 1.4 % OP SOLN
1.0000 [drp] | Freq: Two times a day (BID) | OPHTHALMIC | Status: DC
  Administered 2019-05-02 – 2019-05-04 (×6): 1 [drp] via OPHTHALMIC
  Filled 2019-05-02: qty 15

## 2019-05-02 MED ORDER — ALBUMIN HUMAN 25 % IV SOLN
25.0000 g | Freq: Once | INTRAVENOUS | Status: AC
  Administered 2019-05-02: 13:00:00 25 g via INTRAVENOUS
  Filled 2019-05-02: qty 100

## 2019-05-02 MED ORDER — DEXAMETHASONE SODIUM PHOSPHATE 10 MG/ML IJ SOLN
6.0000 mg | INTRAMUSCULAR | Status: DC
  Administered 2019-05-02 – 2019-05-03 (×3): 6 mg via INTRAVENOUS
  Filled 2019-05-02: qty 0.6
  Filled 2019-05-02: qty 1
  Filled 2019-05-02 (×2): qty 0.6

## 2019-05-02 MED ORDER — NITROGLYCERIN 0.4 MG SL SUBL: 0.4000 mg | SUBLINGUAL_TABLET | SUBLINGUAL | Status: DC | PRN

## 2019-05-02 MED ORDER — MIRTAZAPINE 15 MG PO TABS
7.5000 mg | ORAL_TABLET | Freq: Every day | ORAL | Status: DC
  Administered 2019-05-02 – 2019-05-04 (×3): 7.5 mg via ORAL
  Filled 2019-05-02 (×3): qty 1

## 2019-05-02 MED ORDER — HYDRALAZINE HCL 25 MG PO TABS
25.0000 mg | ORAL_TABLET | Freq: Three times a day (TID) | ORAL | Status: DC
  Administered 2019-05-02 – 2019-05-03 (×3): 25 mg via ORAL
  Filled 2019-05-02 (×4): qty 1

## 2019-05-02 MED ORDER — MUPIROCIN 2 % EX OINT
1.0000 "application " | TOPICAL_OINTMENT | Freq: Two times a day (BID) | CUTANEOUS | Status: DC
  Administered 2019-05-02 – 2019-05-04 (×5): 1 via NASAL
  Filled 2019-05-02: qty 22

## 2019-05-02 MED ORDER — CHLORHEXIDINE GLUCONATE CLOTH 2 % EX PADS: 6.0000 | MEDICATED_PAD | Freq: Every day | CUTANEOUS | Status: DC

## 2019-05-02 MED ORDER — TIMOLOL MALEATE 0.5 % OP SOLN
1.0000 [drp] | Freq: Two times a day (BID) | OPHTHALMIC | Status: DC
  Administered 2019-05-02 – 2019-05-04 (×6): 1 [drp] via OPHTHALMIC
  Filled 2019-05-02: qty 5

## 2019-05-02 MED ORDER — ONDANSETRON HCL 4 MG PO TABS: 4.0000 mg | ORAL_TABLET | Freq: Four times a day (QID) | ORAL | Status: DC | PRN

## 2019-05-02 MED ORDER — LEVETIRACETAM 250 MG PO TABS
250.0000 mg | ORAL_TABLET | ORAL | Status: DC
  Filled 2019-05-02: qty 1

## 2019-05-02 MED ORDER — FAMOTIDINE 20 MG PO TABS
20.0000 mg | ORAL_TABLET | Freq: Two times a day (BID) | ORAL | Status: DC
  Administered 2019-05-02 (×2): 20 mg via ORAL
  Filled 2019-05-02 (×2): qty 1

## 2019-05-02 MED ORDER — LEVETIRACETAM 500 MG PO TABS
500.0000 mg | ORAL_TABLET | Freq: Two times a day (BID) | ORAL | Status: DC
  Administered 2019-05-02 – 2019-05-04 (×6): 500 mg via ORAL
  Filled 2019-05-02 (×5): qty 1

## 2019-05-02 MED ORDER — ONDANSETRON HCL 4 MG/2ML IJ SOLN: 4.0000 mg | Freq: Four times a day (QID) | INTRAMUSCULAR | Status: DC | PRN

## 2019-05-02 MED ORDER — BRIMONIDINE TARTRATE 0.2 % OP SOLN
1.0000 [drp] | Freq: Two times a day (BID) | OPHTHALMIC | Status: DC
  Administered 2019-05-02 – 2019-05-04 (×6): 1 [drp] via OPHTHALMIC
  Filled 2019-05-02: qty 5

## 2019-05-02 MED ORDER — SODIUM CHLORIDE 0.9 % IV SOLN
100.0000 mg | Freq: Every day | INTRAVENOUS | Status: DC
  Administered 2019-05-03 – 2019-05-04 (×2): 100 mg via INTRAVENOUS
  Filled 2019-05-02 (×2): qty 100

## 2019-05-02 MED ORDER — ACETAMINOPHEN 325 MG PO TABS
650.0000 mg | ORAL_TABLET | Freq: Four times a day (QID) | ORAL | Status: DC | PRN
  Administered 2019-05-02: 650 mg via ORAL
  Filled 2019-05-02: qty 2

## 2019-05-02 MED ORDER — DOCUSATE SODIUM 100 MG PO CAPS
100.0000 mg | ORAL_CAPSULE | Freq: Two times a day (BID) | ORAL | Status: DC
  Administered 2019-05-02 – 2019-05-04 (×5): 100 mg via ORAL
  Filled 2019-05-02 (×5): qty 1

## 2019-05-02 MED ORDER — POLYETHYLENE GLYCOL 3350 17 G PO PACK: 17.0000 g | PACK | Freq: Every day | ORAL | Status: DC | PRN

## 2019-05-02 MED ORDER — INSULIN ASPART 100 UNIT/ML ~~LOC~~ SOLN
0.0000 [IU] | Freq: Three times a day (TID) | SUBCUTANEOUS | Status: DC
  Administered 2019-05-03: 3 [IU] via SUBCUTANEOUS
  Administered 2019-05-03: 2 [IU] via SUBCUTANEOUS
  Administered 2019-05-03: 4 [IU] via SUBCUTANEOUS
  Administered 2019-05-04: 1 [IU] via SUBCUTANEOUS
  Administered 2019-05-04: 3 [IU] via SUBCUTANEOUS
  Filled 2019-05-02 (×5): qty 1

## 2019-05-02 MED ORDER — HEPARIN SODIUM (PORCINE) 5000 UNIT/ML IJ SOLN
5000.0000 [IU] | Freq: Three times a day (TID) | INTRAMUSCULAR | Status: DC
  Administered 2019-05-02 – 2019-05-03 (×4): 5000 [IU] via SUBCUTANEOUS
  Filled 2019-05-02 (×4): qty 1

## 2019-05-02 MED ORDER — SODIUM CHLORIDE 0.9 % IV SOLN
200.0000 mg | Freq: Once | INTRAVENOUS | Status: AC
  Administered 2019-05-02: 200 mg via INTRAVENOUS
  Filled 2019-05-02: qty 200

## 2019-05-02 NOTE — Progress Notes (Signed)
[facility]  ROUNDING NOTE   Subjective:   Seen and examined on hemodialysis treatment. Patient tolerating treatment well.   IV albumin with HD treatment.  Status post 2 units PRBC transfusion last night.    HEMODIALYSIS FLOWSHEET:  Blood Flow Rate (mL/min): 4010 mL/min Arterial Pressure (mmHg): -230 mmHg Venous Pressure (mmHg): 160 mmHg Transmembrane Pressure (mmHg): 60 mmHg Ultrafiltration Rate (mL/min): 840 mL/min Dialysate Flow Rate (mL/min): 600 ml/min Conductivity: Machine : 15.4 Conductivity: Machine : 15.4 Dialysis Fluid Bolus: Normal Saline Bolus Amount (mL): 400 mL(as per Nephrologist Order to prevent hypotension )    Objective:  Vital signs in last 24 hours:  Temp:  [97.7 F (36.5 C)-99.9 F (37.7 C)] 97.9 F (36.6 C) (01/10 1200) Pulse Rate:  [50-65] 56 (01/10 1445) Resp:  [10-20] 18 (01/10 1300) BP: (104-180)/(38-101) 158/57 (01/10 1445) SpO2:  [93 %-100 %] 100 % (01/10 1445) Weight:  [55.5 kg] 55.5 kg (01/10 0249)  Weight change:  Filed Weights   05/01/19 1313 05/02/19 0249  Weight: 53.5 kg 55.5 kg    Intake/Output: I/O last 3 completed shifts: In: 1080 [Blood:1080] Out: 0    Intake/Output this shift:  Total I/O In: -  Out: 2 [Stool:2]  Physical Exam: General: NAD,   Head: Normocephalic, atraumatic. Moist oral mucosal membranes  Eyes: Anicteric, PERRL  Neck: Supple, trachea midline  Lungs:  Clear to auscultation  Heart: Regular rate and rhythm  Abdomen:  Soft, nontender,   Extremities:  bilateral BKA  Neurologic: Nonfocal, moving all four extremities  Skin: No lesions  Access: Left permcath    Basic Metabolic Panel: Recent Labs  Lab 05/01/19 1321 05/02/19 0353  NA 138 138  K 3.6 3.7  CL 95* 94*  CO2 28 25  GLUCOSE 101* 100*  BUN 32* 37*  CREATININE 5.76* 6.71*  CALCIUM 7.7* 7.4*    Liver Function Tests: Recent Labs  Lab 05/01/19 1735 05/02/19 0353  AST 26 26  ALT 22 21  ALKPHOS 94 95  BILITOT 0.7 1.0   PROT 6.3* 6.8  ALBUMIN 2.8* 2.8*   No results for input(s): LIPASE, AMYLASE in the last 168 hours. No results for input(s): AMMONIA in the last 168 hours.  CBC: Recent Labs  Lab 05/01/19 1321 05/02/19 0353 05/02/19 1210  WBC 4.0 4.8 4.6  NEUTROABS  --  4.1  --   HGB 5.9* 9.3*  9.5* 10.1*  HCT 20.6* 29.3*  29.8* 31.5*  MCV 98.1 93.6 92.1  PLT 133* 127* 129*    Cardiac Enzymes: No results for input(s): CKTOTAL, CKMB, CKMBINDEX, TROPONINI in the last 168 hours.  BNP: Invalid input(s): POCBNP  CBG: No results for input(s): GLUCAP in the last 168 hours.  Microbiology: Results for orders placed or performed during the hospital encounter of 05/01/19  Respiratory Panel by RT PCR (Flu A&B, Covid) - Nasopharyngeal Swab     Status: Abnormal   Collection Time: 05/01/19  3:16 PM   Specimen: Nasopharyngeal Swab  Result Value Ref Range Status   SARS Coronavirus 2 by RT PCR POSITIVE (A) NEGATIVE Final    Comment: RESULT CALLED TO, READ BACK BY AND VERIFIED WITH: JENNIFER WHITLEY AT 1622 05/01/19.PMF (NOTE) SARS-CoV-2 target nucleic acids are DETECTED. SARS-CoV-2 RNA is generally detectable in upper respiratory specimens  during the acute phase of infection. Positive results are indicative of the presence of the identified virus, but do not rule out bacterial infection or co-infection with other pathogens not detected by the test. Clinical correlation with patient history  and other diagnostic information is necessary to determine patient infection status. The expected result is Negative. Fact Sheet for Patients:  PinkCheek.be Fact Sheet for Healthcare Providers: GravelBags.it This test is not yet approved or cleared by the [place] FDA and  has been authorized for detection and/or diagnosis of SARS-CoV-2 by FDA under an Emergency Use Authorization (EUA).  This EUA will remain in effect (meaning this test can be used)  f or the duration of  the COVID-19 declaration under Section 564(b)(1) of the Act, 21 U.S.C. section 360bbb-3(b)(1), unless the authorization is terminated or revoked sooner.    Influenza A by PCR NEGATIVE NEGATIVE Final   Influenza B by PCR NEGATIVE NEGATIVE Final    Comment: (NOTE) The Xpert Xpress SARS-CoV-2/FLU/RSV assay is intended as an aid in  the diagnosis of influenza from Nasopharyngeal swab specimens and  should not be used as a sole basis for treatment. Nasal washings and  aspirates are unacceptable for Xpert Xpress SARS-CoV-2/FLU/RSV  testing. Fact Sheet for Patients: PinkCheek.be Fact Sheet for Healthcare Providers: GravelBags.it This test is not yet approved or cleared by the [place] FDA and  has been authorized for detection and/or diagnosis of SARS-CoV-2 by  FDA under an Emergency Use Authorization (EUA). This EUA will remain  in effect (meaning this test can be used) for the duration of the  Covid-19 declaration under Section 564(b)(1) of the Act, 21  U.S.C. section 360bbb-3(b)(1), unless the authorization is  terminated or revoked. Performed at [facility], [address] [place], [place] [address]   MRSA PCR Screening     Status: Abnormal   Collection Time: 05/02/19  6:26 AM   Specimen: Nasopharyngeal  Result Value Ref Range Status   MRSA by PCR POSITIVE (A) NEGATIVE Final    Comment:        The GeneXpert MRSA Assay (FDA approved for NASAL specimens only), is one component of a comprehensive MRSA colonization surveillance program. It is not intended to diagnose MRSA infection nor to guide or monitor treatment for MRSA infections. RESULT CALLED TO, READ BACK BY AND VERIFIED WITH: ANNABELLA ROBINSON AT 0840 ON 05/02/2019 [place]. Performed at [facility], [address] [place],  [address]     Coagulation Studies: Recent Labs    05/01/19 1325  LABPROT 17.1*   INR 1.4*    Urinalysis: No results for input(s): COLORURINE, LABSPEC, PHURINE, GLUCOSEU, HGBUR, BILIRUBINUR, KETONESUR, PROTEINUR, UROBILINOGEN, NITRITE, LEUKOCYTESUR in the last 72 hours.  Invalid input(s): APPERANCEUR    Imaging: CT ABDOMEN PELVIS WO CONTRAST  Addendum Date: 05/01/2019   ADDENDUM REPORT: 05/01/2019 15:43 ADDENDUM: The FINDINGS and RECOMMENDATION incorrectly state that no radiopaque gallstones are identified. Small gallstones/gallbladder sludge are seen layering dependently within the gallbladder. There remains no evidence of biliary dilatation. Electronically Signed   By: Zerita Boers M.D.   On: 05/01/2019 15:43   Result Date: 05/01/2019 CLINICAL DATA:  Status post trauma and mid back pain. Concern for retroperitoneal bleed. Patient is anemic. EXAM: CT ABDOMEN AND PELVIS WITHOUT CONTRAST TECHNIQUE: Multidetector CT imaging of the abdomen and pelvis was performed following the standard protocol without IV contrast. COMPARISON:  CT abdomen pelvis dated 10/17/2016 FINDINGS: Lower chest: Calcified pleural scarring is seen in the lung bases, unchanged. There is bibasilar atelectasis. The heart is enlarged. Hepatobiliary: No focal liver abnormality is seen. The gallbladder wall is thickened with a small amount of pericholecystic fluid. No radiopaque gallstones or biliary dilatation. Pancreas: Unremarkable. No pancreatic ductal dilatation or surrounding inflammatory  changes. Spleen: Normal in size without focal abnormality. Adrenals/Urinary Tract: Adrenal glands are unremarkable. Kidneys are normal, without renal calculi, focal lesion, or hydronephrosis. A 7 mm calculus is seen in the upper right ureter. There is no hydroureter on either side. Bladder is incompletely distended. Stomach/Bowel: Stomach is within normal limits. Appendix appears normal. There is colonic diverticulosis without evidence of diverticulitis. No evidence of bowel wall thickening, distention, or inflammatory changes.  Vascular/Lymphatic: Extensive aortic atherosclerosis. No enlarged abdominal or pelvic lymph nodes. Reproductive: Status post hysterectomy. No adnexal masses. Other: Moderate ascites is noted. A fat containing ventral abdominal wall hernia is not significantly changed since 10/17/2016. no evidence of retroperitoneal bleed. Musculoskeletal: Degenerative changes are seen in the spine. A chronic fracture is seen of the inferior left pubic ramus. No acute or significant osseous findings. IMPRESSION: 1. Gallbladder wall thickening with a small amount of pericholecystic fluid is suspicious for acute cholecystitis. No radiopaque gallstones or biliary dilatation. 2. Moderate ascites. Aortic Atherosclerosis (ICD10-I70.0). Electronically Signed: By: Zerita Boers M.D. On: 05/01/2019 15:33   CT Head Wo Contrast  Result Date: 05/01/2019 CLINICAL DATA:  Head and neck trauma after a fall. EXAM: CT HEAD WITHOUT CONTRAST CT CERVICAL SPINE WITHOUT CONTRAST TECHNIQUE: Multidetector CT imaging of the head and cervical spine was performed following the standard protocol without intravenous contrast. Multiplanar CT image reconstructions of the cervical spine were also generated. COMPARISON:  CT head dated 07/09/2018 FINDINGS: CT HEAD FINDINGS Brain: No evidence of acute infarction, hemorrhage, hydrocephalus, extra-axial collection or mass lesion/mass effect. Periventricular white matter hypoattenuation likely represents chronic small vessel ischemic disease. Vascular: There are vascular calcifications in the carotid siphons. Skull: Normal. Negative for fracture or focal lesion. Sinuses/Orbits: Moderate to severe sinus disease in the bilateral maxillary, ethmoid, sphenoid, and frontal sinuses. Other: None. CT CERVICAL SPINE FINDINGS Alignment: Normal. Skull base and vertebrae: No acute fracture. No primary bone lesion or focal pathologic process. Soft tissues and spinal canal: No prevertebral fluid or swelling. No visible canal  hematoma. Disc levels: Varying degrees of multilevel degenerative disc and joint disease are seen in the cervical spine. Upper chest: Negative. Other: None. IMPRESSION: 1. No acute intracranial process. 2. No acute osseous injury in the cervical spine. 3. Moderate to severe paranasal sinus disease. Electronically Signed   By: Zerita Boers M.D.   On: 05/01/2019 15:41   CT Cervical Spine Wo Contrast  Result Date: 05/01/2019 CLINICAL DATA:  Head and neck trauma after a fall. EXAM: CT HEAD WITHOUT CONTRAST CT CERVICAL SPINE WITHOUT CONTRAST TECHNIQUE: Multidetector CT imaging of the head and cervical spine was performed following the standard protocol without intravenous contrast. Multiplanar CT image reconstructions of the cervical spine were also generated. COMPARISON:  CT head dated 07/09/2018 FINDINGS: CT HEAD FINDINGS Brain: No evidence of acute infarction, hemorrhage, hydrocephalus, extra-axial collection or mass lesion/mass effect. Periventricular white matter hypoattenuation likely represents chronic small vessel ischemic disease. Vascular: There are vascular calcifications in the carotid siphons. Skull: Normal. Negative for fracture or focal lesion. Sinuses/Orbits: Moderate to severe sinus disease in the bilateral maxillary, ethmoid, sphenoid, and frontal sinuses. Other: None. CT CERVICAL SPINE FINDINGS Alignment: Normal. Skull base and vertebrae: No acute fracture. No primary bone lesion or focal pathologic process. Soft tissues and spinal canal: No prevertebral fluid or swelling. No visible canal hematoma. Disc levels: Varying degrees of multilevel degenerative disc and joint disease are seen in the cervical spine. Upper chest: Negative. Other: None. IMPRESSION: 1. No acute intracranial process. 2. No acute osseous injury  in the cervical spine. 3. Moderate to severe paranasal sinus disease. Electronically Signed   By: Zerita Boers M.D.   On: 05/01/2019 15:41   DG Chest Portable 1 View  Result Date:  05/01/2019 CLINICAL DATA:  Decreased hemoglobin. EXAM: PORTABLE CHEST 1 VIEW COMPARISON:  Single-view of the chest 07/08/2018. FINDINGS: There is bilateral mid and lower lung zone airspace disease. No pneumothorax or pleural fluid. Cardiomegaly. Atherosclerosis is identified. Dialysis catheter is unchanged. IMPRESSION: Bilateral mid and lower lung zone airspace disease has an appearance most worrisome for pneumonia. Cardiomegaly. Atherosclerosis. Electronically Signed   By: Inge Rise M.D.   On: 05/01/2019 14:58   US Abdomen Limited RUQ  Result Date: 05/01/2019 CLINICAL DATA:  Cholelithiasis. Patient denies abdominal pain/symptoms. EXAM: ULTRASOUND ABDOMEN LIMITED RIGHT UPPER QUADRANT COMPARISON:  None. FINDINGS: Gallbladder: The gallbladder wall demonstrates a thickened edematous wall measuring up to 7.5 mm. Mobile stone is seen in the gallbladder. There are probably tiny stones within the sludge. No Murphy's sign identified. Common bile duct: Diameter: 2.8 mm Liver: Suggested nodular contour. No focal mass. Portal vein is patent on color Doppler imaging with normal direction of blood flow towards the liver. Other: Ascites identified superiorly in the abdomen. IMPRESSION: 1. The gallbladder wall is thickened and edematous. Stones and sludge are seen in the gallbladder. No Murphy's sign noted. The findings are not specific. Acute cholecystitis not excluded on these images. If the clinical picture remains ambiguous, a HIDA scan could further evaluate. 2. The common bile duct is normal. 3. Suggested nodular contour to the liver raises the possibility of cirrhotic change. Recommend clinical correlation. No mass in the liver. Electronically Signed   By: Dorise Bullion III M.D   On: 05/01/2019 16:48     Medications:   . [START ON 05/03/2019] remdesivir 100 mg in NS 100 mL     . atorvastatin  80 mg Oral QHS  . brimonidine  1 drop Both Eyes BID  . carvedilol  25 mg Oral BID  . Chlorhexidine Gluconate  Cloth  6 each Topical Q0600  . dexamethasone (DECADRON) injection  6 mg Intravenous Q24H  . docusate sodium  100 mg Oral BID  . [START ON 05/03/2019] famotidine  10 mg Oral Daily  . hydrALAZINE  25 mg Oral TID  . [START ON 05/04/2019] levETIRAcetam  250 mg Oral Q T,Th,Sa-HD  . levETIRAcetam  500 mg Oral BID  . mirtazapine  7.5 mg Oral Daily  . mupirocin ointment  1 application Nasal BID  . polyvinyl alcohol  1 drop Both Eyes BID  . rOPINIRole  0.25 mg Oral QHS  . sevelamer carbonate  1,600 mg Oral TID WC  . timolol  1 drop Both Eyes BID  . vitamin B-12  1,000 mcg Oral Daily     Assessment/ Plan:  [name] is a 69 y.o. black female with end stage renal disease on hemodialysis, hypertension, peripheral vascular disease, bilateral below the knee amputations, coronary artery disease status post CABG, diabetes mellitus type II insulin dependent, history of endocarditis, congestive heart failure who is admitted to [facility] on 05/01/2019 for Low hemoglobin [D64.9] Cholelithiasis [K80.20] Severe anemia [D64.9] Anemia, unspecified type [D64.9]  [facility] TTS [address] permcath  1. End Stage Renal Disease on hemodialysis: missed dialysis yesterday. Seen and examined on back up hemodialysis treatment today. Tolerating treatment well.   2. Anemia with renal failure: hemoglobin 5.9 on admission. Status post 2 units PRBC. Concern for GI bleed   3. Hypertension:  145/55. Home regimen of carvedilol and hydralazine.  4. Secondary Hyperparathyroidism - sevelamer   LOS: 1 Brinn Westby 1/10/20212:47 PM

## 2019-05-02 NOTE — ED Notes (Signed)
Attempted to call report at [facility]

## 2019-05-02 NOTE — Progress Notes (Signed)
HD Tx Completed    05/02/19 1450  Vital Signs  Pulse Rate (!) 56  BP (!) 158/57  Oxygen Therapy  SpO2 100 %  O2 Device Nasal Cannula  O2 Flow Rate (L/min) 3 L/min  During Hemodialysis Assessment  Blood Flow Rate (mL/min) 400 mL/min  Arterial Pressure (mmHg) -220 mmHg  Venous Pressure (mmHg) 160 mmHg  Transmembrane Pressure (mmHg) 60 mmHg  Ultrafiltration Rate (mL/min) 840 mL/min  Dialysate Flow Rate (mL/min) 600 ml/min  Conductivity: Machine  15.4  HD Safety Checks Performed Yes  Intra-Hemodialysis Comments Tx completed;Tolerated well

## 2019-05-02 NOTE — Progress Notes (Signed)
Pre HD Tx Note  Pt is A*4, currently on 2-3LO2 per [place] SPO2 100%. Pt reports no chest pain or SOB. BP WDL to start HD Tx.  Tx planned to run for2.3hrs on 2K2.5CA Albumin is going to be administrated  05/02/19 1145  Hand-Off documentation  Report given to (Full Name) Newt Minion RN   Report received from (Full Name) Warrick Parisian RN   Vital Signs  Temp 97.9 F (36.6 C)  Temp Source Oral  Pulse Rate (!) 56  Pulse Rate Source Dinamap  Resp 18  BP (!) 156/51  BP Location Right Arm  BP Method Automatic  Patient Position (if appropriate) Lying  Oxygen Therapy  SpO2 97 %  O2 Device Nasal Cannula  O2 Flow Rate (L/min) 2 L/min  Time-Out for Hemodialysis  What Procedure? HD  Pt Identifiers(min of two) MRN/Account#;First/Last Name  Correct Site? Yes  Correct Side? Yes  Correct Procedure? Yes  Consents Verified? Yes  Safety Precautions Reviewed? Yes  Engineer, civil (consulting) Number 5  Station Number  [id number])  UF/Alarm Test Passed  Conductivity: Meter 14  Conductivity: Machine  14  pH 7.4  Reverse Osmosis  (Portable RO 2/21)  Normal Saline Lot Number [id number]  Dialyzer Lot Number [id number]  Disposable Set Lot Number [id number]  Machine Temperature 98.6 F (37 C)  Musician and Audible Yes  Blood Lines Intact and Secured Yes  Pre Treatment Patient Checks  Vascular access used during treatment Catheter  HD catheter dressing before treatment WDL  Patient is receiving dialysis in a chair  (In bed)  Hepatitis B Surface Antigen Results Pending  Isolation Initiated Yes  Hepatitis B Surface Antibody  (Pending)  Date Hepatitis B Surface Antibody Drawn 05/02/19  Hemodialysis Consent Verified Yes  Hemodialysis Standing Orders Initiated Yes  ECG (Telemetry) Monitor On Yes  Prime Ordered Normal Saline  Length of  DialysisTreatment -hour(s) 2.5 Hour(s)  Dialysis Treatment Comments  ([id number])  Dialyzer Elisio 17H NR  Dialysate 2K;2.5 Ca  Dialysis Anticoagulant  None  Dialysate Flow Ordered 600  Blood Flow Rate Ordered 400 mL/min  Ultrafiltration Goal 1 Liters  Pre Treatment Labs CBC;Hepatitis B Surface Antigen  Dialysis Blood Pressure Support Ordered Albumin  Education / Care Plan  Dialysis Education Provided Yes  Documented Education in Care Plan Yes  Hemodialysis Catheter Left Subclavian Double-lumen  Placement Date: 02/11/18   Placed prior to admission: No  Orientation: Left  Access Location: Subclavian  Hemodialysis Catheter Type: Double-lumen  Site Condition No complications  Blue Lumen Status Flushed;Blood return noted;Infusing  Red Lumen Status Infusing;Flushed;Blood return noted  Purple Lumen Status N/A  Catheter fill solution Heparin 1000 units/ml  Catheter fill volume (Arterial) 1.7 cc  Catheter fill volume (Venous) 1.7  Dressing Type Gauze/Drain sponge  Dressing Status Dry;Clean  Drainage Description None   as PO

## 2019-05-02 NOTE — Consult Note (Signed)
Cephas Darby, MD [address]  [place]  [place], [place] [address]  Main: [phone]  Fax: [phone] Pager: [phone]   Consultation  Referring Provider:     No ref. provider found Primary Care Physician:  Kirk Ruths, MD Primary Gastroenterologist:  Dr. [name]    Reason for Consultation:     Acute symptomatic anemia  Date of Admission:  05/01/2019 Date of Consultation:  05/02/2019         HPI:   Stephanie Ellis is a 69 y.o. female with history of coronary disease status post CABG, end-stage renal disease on hemodialysis, diabetes, hypertension, peripheral vascular disease status post bilateral BKA who was sent from dialysis yesterday due to low hemoglobin.  Patient was also symptomatic with shortness of breath, chest x-ray revealed bilateral pneumonia and COVID-19 positive at skilled nursing home on 04/20/2019.  Her hemoglobin on arrival was 5.9, normal MCV, baseline hemoglobin 11.9 on 07/12/2018.  She also has chronic thrombocytopenia.  Received blood transfusion and responded appropriately, hemoglobin this morning was 10.1.  Denies abdominal pain, abdominal distention, melena, rectal bleeding, nausea, vomiting, hematemesis.  CT abdomen and pelvis revealed moderate ascites and gallbladder wall thickening.  Subsequently, underwent right upper quadrant ultrasound which revealed cholelithiasis, nodular liver, patent portal vein with normal direction of the blood flow, ascites.  No evidence of splenomegaly.  LFTs revealed mildly low albumin, otherwise normal GI is consulted for further evaluation of anemia Patient underwent EGD in 2018 for nausea and vomiting which was unremarkable Patient does not know if she is taking Eliquis which is on her medication list Patient denies smoking, alcohol use, drug abuse  NSAIDs: None  Antiplts/Anticoagulants/Anti thrombotics: Eliquis for history of peripheral vascular disease  GI Procedures: EGD 10/24/2016 - Normal  esophagus. - Normal stomach. - Normal examined duodenum. - No specimens collected.  Colonoscopy 09/03/2013 for screening - One 1 mm polyp in the cecum. Resected and retrieved. - One 3 mm polyp in the descending colon. Resected and retrieved. - One 6 mm polyp in the sigmoid colon. Resected and retrieved. - Diverticulosis in the sigmoid colon and in the ascending colon.  Diagnosis: A: Colon, cecum, biopsy - Adenomatous polyp (1 fragment)  B: Colon, descending, biopsy - Adenomatous polyp (multiple fragments) - Unremarkable colonic mucosa (2 fragments)  Past Medical History:  Diagnosis Date  . (HFpEF) heart failure with preserved ejection fraction ([place])    a. 01/2018 Echo: EF 50-55%, no rwma, Gr1 DD, triv AI, mild MR. Midly dil LA/RV, mod reduced RV fxn. Irreg thickening of TV w/ mobile echodensity that appears to arise from valve.  . Anemia   . Anorexia   . Bacteremia due to Pseudomonas   . Carotid arterial disease ([place])    a. 01/2017 Carotid U/S: RICA <41, LICA <28.  Marland Kitchen Complication of anesthesia    a. Pt reports h/o complication on 9 different occasions - ? hypotension/arrest.  . 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 01/2015 Cath [facility]): 70% LM, p-mLAD 50-60% (Resting FFR 0.75), mRCA 80-90%, ~40 Ost OM & D1-->CABG; b. 01/2016 Staged PCI of LCX x 2 and RCA.  Marland Kitchen ESRD (end stage renal disease) on dialysis ([place])    a. ESRD secondary to acute kidney failure s/p CABG-->PD  . Essential hypertension   . GERD (gastroesophageal reflux disease)   . Heart murmur   . Hypercholesterolemia   . Myocardial infarction ([place]) 2016  . PVD (peripheral vascular disease) ([place])  a. 01/30/2018 PV Angio: Sev Left Popliteal dzs s/p PTA w/ 75m lutonix DEB w/ mech aspiration of the L popliteal, L AT, and Left tibioperoneal trunck and peroneal artery.  . 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  [facility]: Dr. BMarland KitchenHaithcock: CABG x 3, LIMA to LAD, SVG to RCA, SVG to OM3, EVH  .  Sinusitis 2019  . Type II diabetes mellitus with complication (HCC)    CAD    Past Surgical History:  Procedure Laterality Date  . ABDOMINAL HYSTERECTOMY    . AMPUTATION Right 02/25/2018   Procedure: AMPUTATION BELOW KNEE;  Surgeon: SKatha Cabal MD;  Location: [facility];  Service: Vascular;  Laterality: Right;  . AMPUTATION Left 03/11/2018   Procedure: AMPUTATION BELOW KNEE;  Surgeon: DAlgernon Huxley MD;  Location: [facility];  Service: Vascular;  Laterality: Left;  . AMPUTATION Bilateral 04/13/2018   Procedure: REVISION OF BILATERAL AMPUTATIONS BELOW KNEE WITH WOUND VAC APPLICATIONS;  Surgeon: EEvaristo Bury MD;  Location: [facility];  Service: Vascular;  Laterality: Bilateral;  . AMPUTATION TOE Left 02/06/2018   Procedure: AMPUTATION GREAT TOE;  Surgeon: FSamara Deist DPM;  Location: [facility];  Service: Podiatry;  Laterality: Left;  . APPLICATION OF WOUND VAC Left 04/13/2018   Procedure: APPLICATION OF WOUND VAC;  Surgeon: EEvaristo Bury MD;  Location: [facility];  Service: Vascular;  Laterality: Left;  . ARTERIOVENOUS GRAFT PLACEMENT  05/2016  . AV FISTULA PLACEMENT Left 05/30/2016   Procedure: ARTERIOVENOUS graft;  Surgeon: JAlgernon Huxley MD;  Location: [facility];  Service: Vascular;  Laterality: Left;  . CAPD INSERTION N/A 10/30/2017   Procedure: LAPAROSCOPIC INSERTION CONTINUOUS AMBULATORY PERITONEAL DIALYSIS  (CAPD) CATHETER;  Surgeon: DAlgernon Huxley MD;  Location: [facility];  Service: Vascular;  Laterality: N/A;  . CARDIAC CATHETERIZATION  01/2015   [facility]: Ost LM 70%, p-m LAD 50-60% (Rest FFR + @ 0.75), mRCA 80-90%, ostD1 40%, pOM1 40%  . CATARACT EXTRACTION W/PHACO Left 01/18/2016   Procedure: CATARACT EXTRACTION PHACO AND INTRAOCULAR LENS PLACEMENT (IOC);  Surgeon: BEulogio Bear MD;  Location: [facility];  Service: Ophthalmology;  Laterality: Left;  [place]1.05 AP% 15.5 CDE 10.16 Fluid Pack Lot # [id number]  . CATARACT EXTRACTION W/PHACO Right 08/01/2016   Procedure: CATARACT EXTRACTION  PHACO AND INTRAOCULAR LENS PLACEMENT (IOC);  Surgeon: BEulogio Bear MD;  Location: [facility];  Service: Ophthalmology;  Laterality: Right;  [place]01:00.6 AP% 11.4 CDE 6.93  LOT # [id number]  . COLONOSCOPY    . CORONARY ANGIOPLASTY     SENTS 02/12/16  . CORONARY ARTERY BYPASS GRAFT  03/28/15    [facility]: Dr. HWaldemar Dickens LIMA to LAD, SVG to RCA, SVG to OM3, EVH  . DIALYSIS/PERMA CATHETER INSERTION N/A 02/11/2018   Procedure: DIALYSIS/PERMA CATHETER INSERTION;  Surgeon: DAlgernon Huxley MD;  [facility];  Service: Cardiovascular;  Laterality: N/A;  . DIALYSIS/PERMA CATHETER REMOVAL Right 02/04/2018   Procedure: DIALYSIS/PERMA CATHETER REMOVAL;  Surgeon: SKatha Cabal MD;  [facility];  Service: Cardiovascular;  Laterality: Right;  . ESOPHAGOGASTRODUODENOSCOPY (EGD) WITH PROPOFOL N/A 10/24/2016   Procedure: ESOPHAGOGASTRODUODENOSCOPY (EGD) WITH PROPOFOL;  Surgeon: WLucilla Lame MD;  Location: [facility];  Service: Endoscopy;  Laterality: N/A;  . EYE SURGERY Bilateral    cataract surgery  . EYE SURGERY     drains for glaucoma  . INSERTION EXPRESS TUBE SHUNT Right 08/01/2016   Procedure: INSERTION EXPRESS TUBE SHUNT;  Surgeon: BEulogio Bear MD;  Location:  [facility];  Service: Ophthalmology;  Laterality: Right;  . INSERTION OF AHMED VALVE Left 08/15/2016   Procedure: INSERTION OF AHMED VALVE;  Surgeon: Eulogio Bear, MD;  Location: [facility];  Service: Ophthalmology;  Laterality: Left;  . INSERTION OF DIALYSIS CATHETER    . LOWER EXTREMITY ANGIOGRAPHY Right 02/19/2018   Procedure: Lower Extremity Angiography;  Surgeon: Algernon Huxley, MD;  Location: [place] [facility];  Service: Cardiovascular;  Laterality: Right;  . LOWER EXTREMITY ANGIOGRAPHY Left 01/30/2018   Procedure: Lower Extremity Angiography;  Surgeon: Katha Cabal, MD;  Location: [place] [facility];  Service: Cardiovascular;  Laterality: Left;  . REMOVAL OF A DIALYSIS CATHETER N/A  02/25/2018   Procedure: REMOVAL OF A DIALYSIS CATHETER;  Surgeon: Katha Cabal, MD;  Location: [facility];  Service: Vascular;  Laterality: N/A;  . TEMPORARY DIALYSIS CATHETER N/A 02/06/2018   Procedure: TEMPORARY DIALYSIS CATHETER;  Surgeon: Katha Cabal, MD;  Location: [place] [facility];  Service: Cardiovascular;  Laterality: N/A;  . TRANSTHORACIC ECHOCARDIOGRAM  January 2017    EF 60-65%. GR 2 DD. Mild degenerative mitral valve disease but no prolapse or regurgitation. Mild left atrial dilation. Mild to moderate LVH. Pericardial effusion gone  . UPPER EXTREMITY ANGIOGRAPHY Left 06/27/2016   Procedure: Upper Extremity Angiography;  Surgeon: Algernon Huxley, MD;  Location: [place] [facility];  Service: Cardiovascular;  Laterality: Left;  . UPPER EXTREMITY ANGIOGRAPHY Left 08/22/2016   Procedure: Upper Extremity Angiography;  Surgeon: Algernon Huxley, MD;  Location: [place] [facility];  Service: Cardiovascular;  Laterality: Left;  Marland Kitchen VASCULAR SURGERY    . WOUND DEBRIDEMENT Left 05/08/2018   Procedure: DEBRIDEMENT OF LEFT LEG BKA;  Surgeon: Algernon Huxley, MD;  Location: [facility];  Service: General;  Laterality: Left;    Prior to Admission medications   Medication Sig Start Date End Date Taking? Authorizing Provider  apixaban (ELIQUIS) 2.5 MG TABS tablet Take 1 tablet (2.5 mg total) by mouth 2 (two) times daily. 02/15/18  Yes Vaughan Basta, MD  aspirin EC 81 MG tablet Take 1 tablet (81 mg total) by mouth daily. 10/19/16  Yes Dustin Flock, MD  atorvastatin (LIPITOR) 80 MG tablet Take 80 mg by mouth at bedtime.  05/05/17  Yes [provider]  brimonidine (ALPHAGAN) 0.2 % ophthalmic solution Place 1 drop into both eyes 3 (three) times daily. Patient taking differently: Place 1 drop into both eyes 2 (two) times daily.  03/18/18  Yes Mody, Ulice Bold, MD  carvedilol (COREG) 25 MG tablet Take 1 tablet (25 mg total) by mouth 2 (two) times daily. 07/13/18  Yes Ojie, Jude, MD  docusate  sodium (COLACE) 100 MG capsule Take 1 capsule (100 mg total) by mouth 2 (two) times daily. 02/15/18  Yes Vaughan Basta, MD  famotidine (PEPCID) 20 MG tablet Take 20 mg by mouth 2 (two) times daily.   Yes [provider]  insulin aspart (NOVOLOG) 100 UNIT/ML injection Inject into the skin 3 (three) times daily before meals. Sliding scale 151-200=3u, 201-250=4u, 251-300=5u, 300-400=7u   Yes [provider]  insulin glargine (LANTUS) 100 UNIT/ML injection Inject 11 Units into the skin at bedtime.    Yes [provider]  isosorbide mononitrate (IMDUR) 30 MG 24 hr tablet Take 30 mg by mouth daily.    Yes [provider]  levETIRAcetam (KEPPRA) 250 MG tablet Three times weekly after dialysis 05/12/18  Yes Wieting, Richard, MD  levETIRAcetam (KEPPRA) 500 MG tablet Take 1 tablet (500  mg total) by mouth 2 (two) times daily. 03/23/18  Yes Gouru, Illene Silver, MD  mirtazapine (REMERON) 7.5 MG tablet Take 7.5 mg by mouth daily.  12/18/17  Yes [provider]  nitroGLYCERIN (NITROSTAT) 0.4 MG SL tablet Place 1 tablet (0.4 mg total) under the tongue every 5 (five) minutes as needed for chest pain. 09/05/16  Yes Wieting, Richard, MD  Omega-3 1000 MG CAPS Take 2 capsules by mouth daily.    Yes [provider]  Polyvinyl Alcohol-Povidone PF (REFRESH) 1.4-0.6 % SOLN Place 1 drop into both eyes 2 (two) times a day.    Yes [provider]  rOPINIRole (REQUIP) 0.25 MG tablet Take 1 tablet (0.25 mg total) by mouth at bedtime. 05/12/18  Yes Wieting, Richard, MD  sevelamer carbonate (RENVELA) 800 MG tablet Take 1,600 mg by mouth 3 (three) times daily with meals.    Yes [provider]  timolol (TIMOPTIC) 0.5 % ophthalmic solution Place 1 drop into both eyes 3 (three) times daily. Patient taking differently: Place 1 drop into both eyes 2 (two) times daily.  03/18/18  Yes Mody, Ulice Bold, MD  traMADol (ULTRAM) 50 MG tablet Take 50 mg by mouth every 8 (eight)  hours as needed.  08/27/18  Yes [provider]  vitamin B-12 (CYANOCOBALAMIN) 1000 MCG tablet Take 1 tablet (1,000 mcg total) by mouth daily. 02/15/18  Yes Vaughan Basta, MD  hydrALAZINE (APRESOLINE) 25 MG tablet Take 1 tablet (25 mg total) by mouth 3 (three) times daily for 30 days. 07/13/18 12/18/18  Otila Back, MD   Current Facility-Administered Medications:  .  acetaminophen (TYLENOL) tablet 650 mg, 650 mg, Oral, Q6H PRN, Jennye Boroughs, MD .  atorvastatin (LIPITOR) tablet 80 mg, 80 mg, Oral, QHS, Jennye Boroughs, MD, 80 mg at 05/02/19 0134 .  brimonidine (ALPHAGAN) 0.2 % ophthalmic solution 1 drop, 1 drop, Both Eyes, BID, Jennye Boroughs, MD, 1 drop at 05/02/19 1106 .  carvedilol (COREG) tablet 25 mg, 25 mg, Oral, BID, Jennye Boroughs, MD, 25 mg at 05/02/19 0135 .  Chlorhexidine Gluconate Cloth 2 % PADS 6 each, 6 each, Topical, Q0600, Vashti Hey, MD, 6 each at 05/02/19 1114 .  dexamethasone (DECADRON) injection 6 mg, 6 mg, Intravenous, Q24H, Jennye Boroughs, MD, 6 mg at 05/02/19 0139 .  docusate sodium (COLACE) capsule 100 mg, 100 mg, Oral, BID, Jennye Boroughs, MD, 100 mg at 05/02/19 0135 .  [START ON 05/03/2019] famotidine (PEPCID) tablet 10 mg, 10 mg, Oral, Daily, Jamse Arn, Dewaine Oats Tublu, MD .  hydrALAZINE (APRESOLINE) tablet 25 mg, 25 mg, Oral, TID, Jennye Boroughs, MD .  Derrill Memo ON 05/04/2019] levETIRAcetam (KEPPRA) tablet 250 mg, 250 mg, Oral, Q T,Th,Sa-HD, Jennye Boroughs, MD .  levETIRAcetam (KEPPRA) tablet 500 mg, 500 mg, Oral, BID, Jennye Boroughs, MD, 500 mg at 05/02/19 1107 .  mirtazapine (REMERON) tablet 7.5 mg, 7.5 mg, Oral, Daily, Jennye Boroughs, MD, 7.5 mg at 05/02/19 1107 .  mupirocin ointment (BACTROBAN) 2 % 1 application, 1 application, Nasal, BID, Chatterjee, Dewaine Oats Tublu, MD .  nitroGLYCERIN (NITROSTAT) SL tablet 0.4 mg, 0.4 mg, Sublingual, Q5 min PRN, Jennye Boroughs, MD .  ondansetron (ZOFRAN) tablet 4 mg, 4 mg, Oral, Q6H PRN **OR** ondansetron  (ZOFRAN) injection 4 mg, 4 mg, Intravenous, Q6H PRN, Jennye Boroughs, MD .  polyethylene glycol (MIRALAX / GLYCOLAX) packet 17 g, 17 g, Oral, Daily PRN, Jennye Boroughs, MD .  polyvinyl alcohol (LIQUIFILM TEARS) 1.4 % ophthalmic solution 1 drop, 1 drop, Both Eyes, BID, Jennye Boroughs, MD, 1 drop at  05/02/19 1106 .  [COMPLETED] remdesivir 200 mg in sodium chloride 0.9% 250 mL IVPB, 200 mg, Intravenous, Once, Last Rate: 580 mL/hr at 05/02/19 0349, 200 mg at 05/02/19 0349 **FOLLOWED BY** [START ON 05/03/2019] remdesivir 100 mg in sodium chloride 0.9 % 100 mL IVPB, 100 mg, Intravenous, Daily, Jennye Boroughs, MD .  rOPINIRole (REQUIP) tablet 0.25 mg, 0.25 mg, Oral, QHS, Jennye Boroughs, MD, 0.25 mg at 05/02/19 0345 .  sevelamer carbonate (RENVELA) tablet 1,600 mg, 1,600 mg, Oral, TID WC, Jennye Boroughs, MD, 1,600 mg at 05/02/19 0900 .  timolol (TIMOPTIC) 0.5 % ophthalmic solution 1 drop, 1 drop, Both Eyes, BID, Jennye Boroughs, MD, 1 drop at 05/02/19 1106 .  traMADol (ULTRAM) tablet 50 mg, 50 mg, Oral, Q8H PRN, Jennye Boroughs, MD .  vitamin B-12 (CYANOCOBALAMIN) tablet 1,000 mcg, 1,000 mcg, Oral, Daily, Jennye Boroughs, MD, 1,000 mcg at 05/02/19 1108   Family History  Problem Relation Age of Onset  . Diabetes Mellitus II Mother   . Heart failure Mother   . Pancreatic cancer Father      Social History   Tobacco Use  . Smoking status: Never Smoker  . Smokeless tobacco: Never Used  Substance Use Topics  . Alcohol use: No  . Drug use: No    Allergies as of 05/01/2019 - Review Complete 05/01/2019  Allergen Reaction Noted  . Chlorthalidone Anaphylaxis, Itching, and Rash 10/03/2015  . Fentanyl Rash 02/12/2016  . Midazolam Rash 02/12/2016  . Ace inhibitors Other (See Comments) 10/03/2015  . Angiotensin receptor blockers Other (See Comments) 10/03/2015  . Norvasc [amlodipine] Itching and Rash 10/03/2015  . Phenergan [promethazine hcl] Anxiety 10/24/2016    Review of Systems:    All systems  reviewed and negative except where noted in HPI.   Physical Exam:  Vital signs in last 24 hours: Temp:  [97.7 F (36.5 C)-99.9 F (37.7 C)] 97.8 F (36.6 C) (01/10 1500) Pulse Rate:  [50-65] 55 (01/10 1500) Resp:  [10-22] 22 (01/10 1500) BP: (104-180)/(38-101) 158/59 (01/10 1500) SpO2:  [93 %-100 %] 100 % (01/10 1500) Weight:  [55.5 kg] 55.5 kg (01/10 0249) Last BM Date: 05/02/19 General:   Pleasant, cooperative in NAD Head:  Normocephalic and atraumatic. Eyes:   No icterus.   Conjunctiva pale. PERRLA, periorbital puffiness. Ears:  Normal auditory acuity. Neck:  Supple; no masses or thyroidomegaly Lungs: Respirations even and unlabored. Lungs clear to auscultation bilaterally.   No wheezes, crackles, or rhonchi.  Heart:  Regular rate and rhythm;  Without murmur, clicks, rubs or gallops Abdomen:  Soft, nondistended, nontender. Normal bowel sounds. No appreciable masses or hepatomegaly.  No rebound or guarding.  Rectal:  Not performed. Msk:  Symmetrical, bilateral BKA Extremities:  Without edema, cyanosis or clubbing. Neurologic:  Alert and oriented x3;  grossly normal neurologically. Skin:  Intact without significant lesions or rashes. Psych:  Alert and cooperative. Normal affect.  LAB RESULTS: CBC Latest Ref Rng & Units 05/02/2019 05/02/2019 05/02/2019  WBC 4.0 - 10.5 K/uL 4.6 4.8 -  Hemoglobin 12.0 - 15.0 g/dL 10.1(L) 9.3(L) 9.5(L)  Hematocrit 36.0 - 46.0 % 31.5(L) 29.3(L) 29.8(L)  Platelets 150 - 400 K/uL 129(L) 127(L) -    BMET BMP Latest Ref Rng & Units 05/02/2019 05/01/2019 07/12/2018  Glucose 70 - 99 mg/dL 100(H) 101(H) 236(H)  BUN 8 - 23 mg/dL 37(H) 32(H) 23  Creatinine 0.44 - 1.00 mg/dL 6.71(H) 5.76(H) 4.05(H)  Sodium 135 - 145 mmol/L 138 138 140  Potassium 3.5 - 5.1 mmol/L 3.7 3.6 4.0  Chloride 98 - 111 mmol/L 94(L) 95(L) 104  CO2 22 - 32 mmol/L _0 Calcium 8.9 - 10.3 mg/dL 7.4(L) 7.7(L) 9.3    LFT Hepatic Function Latest Ref Rng & Units 05/02/2019 05/01/2019  07/08/2018  Total Protein 6.5 - 8.1 g/dL 6.8 6.3(L) 7.2  Albumin 3.5 - 5.0 g/dL 2.8(L) 2.8(L) 3.5  AST 15 - 41 U/L _1 ALT 0 - 44 U/L _2 Alk Phosphatase 38 - 126 U/L 95 94 85  Total Bilirubin 0.3 - 1.2 mg/dL 1.0 0.7 0.4  Bilirubin, Direct 0.0 - 0.2 mg/dL - <0.1 -     STUDIES: CT ABDOMEN PELVIS WO CONTRAST  Addendum Date: 05/01/2019   ADDENDUM REPORT: 05/01/2019 15:43 ADDENDUM: The FINDINGS and RECOMMENDATION incorrectly state that no radiopaque gallstones are identified. Small gallstones/gallbladder sludge are seen layering dependently within the gallbladder. There remains no evidence of biliary dilatation. Electronically Signed   By: Zerita Boers M.D.   On: 05/01/2019 15:43   Result Date: 05/01/2019 CLINICAL DATA:  Status post trauma and mid back pain. Concern for retroperitoneal bleed. Patient is anemic. EXAM: CT ABDOMEN AND PELVIS WITHOUT CONTRAST TECHNIQUE: Multidetector CT imaging of the abdomen and pelvis was performed following the standard protocol without IV contrast. COMPARISON:  CT abdomen pelvis dated 10/17/2016 FINDINGS: Lower chest: Calcified pleural scarring is seen in the lung bases, unchanged. There is bibasilar atelectasis. The heart is enlarged. Hepatobiliary: No focal liver abnormality is seen. The gallbladder wall is thickened with a small amount of pericholecystic fluid. No radiopaque gallstones or biliary dilatation. Pancreas: Unremarkable. No pancreatic ductal dilatation or surrounding inflammatory changes. Spleen: Normal in size without focal abnormality. Adrenals/Urinary Tract: Adrenal glands are unremarkable. Kidneys are normal, without renal calculi, focal lesion, or hydronephrosis. A 7 mm calculus is seen in the upper right ureter. There is no hydroureter on either side. Bladder is incompletely distended. Stomach/Bowel: Stomach is within normal limits. Appendix appears normal. There is colonic diverticulosis without evidence of diverticulitis. No evidence of  bowel wall thickening, distention, or inflammatory changes. Vascular/Lymphatic: Extensive aortic atherosclerosis. No enlarged abdominal or pelvic lymph nodes. Reproductive: Status post hysterectomy. No adnexal masses. Other: Moderate ascites is noted. A fat containing ventral abdominal wall hernia is not significantly changed since 10/17/2016. no evidence of retroperitoneal bleed. Musculoskeletal: Degenerative changes are seen in the spine. A chronic fracture is seen of the inferior left pubic ramus. No acute or significant osseous findings. IMPRESSION: 1. Gallbladder wall thickening with a small amount of pericholecystic fluid is suspicious for acute cholecystitis. No radiopaque gallstones or biliary dilatation. 2. Moderate ascites. Aortic Atherosclerosis (ICD10-I70.0). Electronically Signed: By: Zerita Boers M.D. On: 05/01/2019 15:33   CT Head Wo Contrast  Result Date: 05/01/2019 CLINICAL DATA:  Head and neck trauma after a fall. EXAM: CT HEAD WITHOUT CONTRAST CT CERVICAL SPINE WITHOUT CONTRAST TECHNIQUE: Multidetector CT imaging of the head and cervical spine was performed following the standard protocol without intravenous contrast. Multiplanar CT image reconstructions of the cervical spine were also generated. COMPARISON:  CT head dated 07/09/2018 FINDINGS: CT HEAD FINDINGS Brain: No evidence of acute infarction, hemorrhage, hydrocephalus, extra-axial collection or mass lesion/mass effect. Periventricular white matter hypoattenuation likely represents chronic small vessel ischemic disease. Vascular: There are vascular calcifications in the carotid siphons. Skull: Normal. Negative for fracture or focal lesion. Sinuses/Orbits: Moderate to severe sinus disease in the bilateral maxillary, ethmoid, sphenoid, and frontal sinuses. Other: None. CT CERVICAL SPINE FINDINGS Alignment: Normal. Skull base and vertebrae: No  acute fracture. No primary bone lesion or focal pathologic process. Soft tissues and spinal canal:  No prevertebral fluid or swelling. No visible canal hematoma. Disc levels: Varying degrees of multilevel degenerative disc and joint disease are seen in the cervical spine. Upper chest: Negative. Other: None. IMPRESSION: 1. No acute intracranial process. 2. No acute osseous injury in the cervical spine. 3. Moderate to severe paranasal sinus disease. Electronically Signed   By: Zerita Boers M.D.   On: 05/01/2019 15:41   CT Cervical Spine Wo Contrast  Result Date: 05/01/2019 CLINICAL DATA:  Head and neck trauma after a fall. EXAM: CT HEAD WITHOUT CONTRAST CT CERVICAL SPINE WITHOUT CONTRAST TECHNIQUE: Multidetector CT imaging of the head and cervical spine was performed following the standard protocol without intravenous contrast. Multiplanar CT image reconstructions of the cervical spine were also generated. COMPARISON:  CT head dated 07/09/2018 FINDINGS: CT HEAD FINDINGS Brain: No evidence of acute infarction, hemorrhage, hydrocephalus, extra-axial collection or mass lesion/mass effect. Periventricular white matter hypoattenuation likely represents chronic small vessel ischemic disease. Vascular: There are vascular calcifications in the carotid siphons. Skull: Normal. Negative for fracture or focal lesion. Sinuses/Orbits: Moderate to severe sinus disease in the bilateral maxillary, ethmoid, sphenoid, and frontal sinuses. Other: None. CT CERVICAL SPINE FINDINGS Alignment: Normal. Skull base and vertebrae: No acute fracture. No primary bone lesion or focal pathologic process. Soft tissues and spinal canal: No prevertebral fluid or swelling. No visible canal hematoma. Disc levels: Varying degrees of multilevel degenerative disc and joint disease are seen in the cervical spine. Upper chest: Negative. Other: None. IMPRESSION: 1. No acute intracranial process. 2. No acute osseous injury in the cervical spine. 3. Moderate to severe paranasal sinus disease. Electronically Signed   By: Zerita Boers M.D.   On: 05/01/2019  15:41   DG Chest Portable 1 View  Result Date: 05/01/2019 CLINICAL DATA:  Decreased hemoglobin. EXAM: PORTABLE CHEST 1 VIEW COMPARISON:  Single-view of the chest 07/08/2018. FINDINGS: There is bilateral mid and lower lung zone airspace disease. No pneumothorax or pleural fluid. Cardiomegaly. Atherosclerosis is identified. Dialysis catheter is unchanged. IMPRESSION: Bilateral mid and lower lung zone airspace disease has an appearance most worrisome for pneumonia. Cardiomegaly. Atherosclerosis. Electronically Signed   By: Inge Rise M.D.   On: 05/01/2019 14:58   US Abdomen Limited RUQ  Result Date: 05/01/2019 CLINICAL DATA:  Cholelithiasis. Patient denies abdominal pain/symptoms. EXAM: ULTRASOUND ABDOMEN LIMITED RIGHT UPPER QUADRANT COMPARISON:  None. FINDINGS: Gallbladder: The gallbladder wall demonstrates a thickened edematous wall measuring up to 7.5 mm. Mobile stone is seen in the gallbladder. There are probably tiny stones within the sludge. No Murphy's sign identified. Common bile duct: Diameter: 2.8 mm Liver: Suggested nodular contour. No focal mass. Portal vein is patent on color Doppler imaging with normal direction of blood flow towards the liver. Other: Ascites identified superiorly in the abdomen. IMPRESSION: 1. The gallbladder wall is thickened and edematous. Stones and sludge are seen in the gallbladder. No Murphy's sign noted. The findings are not specific. Acute cholecystitis not excluded on these images. If the clinical picture remains ambiguous, a HIDA scan could further evaluate. 2. The common bile duct is normal. 3. Suggested nodular contour to the liver raises the possibility of cirrhotic change. Recommend clinical correlation. No mass in the liver. Electronically Signed   By: Dorise Bullion III M.D   On: 05/01/2019 16:48      Impression / Plan:   Christiane Sistare is a 69 y.o. female with ESRD, metabolic syndrome, diabetes,  coronary disease post CABG, CHF, peripheral artery disease  s/p bilateral BKA, on Eliquis who presented from dialysis secondary to low hemoglobin and shortness of breath, found to have COVID-19 pneumonia.  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      LOS: 1 day   Sherri Sear, MD  05/02/2019, 3:53 PM   Note: This dictation was prepared with Dragon dictation along with smaller phrase technology. Any transcriptional errors that result from this process are unintentional.

## 2019-05-02 NOTE — Progress Notes (Signed)
Post HD Tx Note Pt tolerated well the HD Tx. Prescribed UF goal of1L was met. Pt received Albumin 25g in 132m as Nephrologist order. Pt' BP continued to be stable throughout Tx. Pt reported no chest pain or SOB. Pt continued to be afebrile. CVC limbs are heparin locked closed and clamped.    05/02/19 1500  Hand-Off documentation  Report given to (Full Name) AWarrick ParisianRN   Report received from (Full Name) DNewt MinionRN   Vital Signs  Temp 97.8 F (36.6 C)  Temp Source Oral  Pulse Rate (!) 55  Pulse Rate Source Dinamap  Resp (!) 22  BP (!) 158/59  BP Location Right Arm  BP Method Automatic  Patient Position (if appropriate) Lying  Oxygen Therapy  SpO2 100 %  O2 Device Nasal Cannula  O2 Flow Rate (L/min) 3 L/min  Post-Hemodialysis Assessment  Rinseback Volume (mL) 250 mL  KECN 53.4 V  Dialyzer Clearance Lightly streaked  Duration of HD Treatment -hour(s) 2.5 hour(s)  Hemodialysis Intake (mL) 1000 mL  UF Total -Machine (mL) 2000 mL  Net UF (mL) 1000 mL  Tolerated HD Treatment Yes  Hemodialysis Catheter Left Subclavian Double-lumen  Placement Date: 02/11/18   Placed prior to admission: No  Orientation: Left  Access Location: Subclavian  Hemodialysis Catheter Type: Double-lumen  Site Condition No complications  Blue Lumen Status Heparin locked  Red Lumen Status Heparin locked  Purple Lumen Status N/A  Catheter fill solution Heparin 1000 units/ml  Catheter fill volume (Arterial) 1.7 cc  Catheter fill volume (Venous) 1.7  Dressing Type Biopatch  Dressing Status Clean;Dry;Intact  Interventions New dressing;Dressing changed  Drainage Description None  Dressing Change Due 05/07/19  Post treatment catheter status Capped and Clamped

## 2019-05-02 NOTE — Progress Notes (Addendum)
PROGRESS NOTE    Stephanie Ellis  RSW:[id number]  DOB: 29-Dec-1950  DOA: 05/01/2019 PCP: Kirk Ruths, MD Outpatient Specialists:   Hospital course:  Stephanie Ellis is an 69 y.o. female with medical history significant for chronic diastolic CHF, anemia, carotid artery disease, CAD, PVD status post bilateral BKA end-stage renal disease on hemodialysis on Tuesdays Thursdays and Saturdays, hypertension, GERD.  She was was referred to the ED from outpatient dialysis where she was noted to have a hemoglobin of 5.8.  Subjective:  She states that she feels well today.  Much better after her transfusion yesterday.  She has no complaints.  Denies any knowledge of any site of bleeding.   Objective: Vitals:   05/02/19 1430 05/02/19 1445 05/02/19 1450 05/02/19 1500  BP: (!) 152/79 (!) 158/57 (!) 158/57 (!) 158/59  Pulse: (!) 58 (!) 56 (!) 56 (!) 55  Resp:      Temp:      TempSrc:      SpO2: 100% 100% 100% 100%  Weight:      Height:        Intake/Output Summary (Last 24 hours) at 05/02/2019 1521 Last data filed at 05/02/2019 1518 Gross per 24 hour  Intake 1300 ml  Output 2 ml  Net 1298 ml   Filed Weights   05/01/19 1313 05/02/19 0249  Weight: 53.5 kg 55.5 kg    Exam:  General: Chronically ill-appearing female looking much older than stated age sitting up in bed in good spirits eating breakfast in no acute distress. Constitutional: Alert and awake, oriented x3, not in any acute distress. Eyes: sclera anicteric, conjuctiva clear, no lid lag, no exophthalmos, EOMI CVS: S1-S2 clear, no murmur rubs or gallops, no LE edema, normal pedal pulses  Respiratory:  normal effort, symmetrical excursion, CTA without adventitious sounds.  GI: NABS, soft, NT, ND, no palpable masses.  LE: Status post bilateral BKA Neuro: A/O x 3, Moving all extremities equally with normal strength, CN 3-12 intact, grossly nonfocal.  Psych: patient is logical and coherent, judgement and insight appear  normal, mood and affect appropriate to situation. Skin: no rashes or lesions or ulcers,   Assessment & Plan:   Patient is a 69 year old female with ESRD on HD admitted from hemodialysis for severe symptomatic anemia, status post transfusion 05/01/2019.  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  Patient was noted to be guaiac negative in the ED.  She was also noted to have incidental gallbladder thickening on CT witH normal abdominal exam.  Chest x-ray revealed patchy infiltrates consistent with pneumonia and patient did have an oxygen requirement.  Patient had been diagnosed with Covid positivity on 04/19/2020 at her SNF.  Anemia r/o GIB Unclear etiology of decrease in hemoglobin to 6 on admission 05/01/2019 Abdominal pelvic CT without evidence of retroperitoneal abscess Stool guaiacs are negative GI has been consulted who has recommended endoscopic work-up as an outpatient Eliquis has been held--we presume patient is on Eliquis for PVD Status post 2 units PRBC with posttransfusion hemoglobin 9.3 today 10.1 No worsening of respiratory status denies shortness of breath at rest.  Pneumonia secondary to COVID-19 We will treat with dexamethasone day #2 of 10 No remdesivir given end-stage renal disease per renal Only on 3 L oxygen to keep O2 sats greater than 92% CRP was 8.4, calcitonin 0.9  Abnormal gallbladder on CT No clinical or laboratory evidence of acute cholecystitis  ESRD on HD HD as per nephrology service who knows her well  PVD Continue atorvastatin and carvedilol Presumably patient is on Eliquis for PVD, Eliquis is being held pending serial CBCs  HTN Well-controlled on carvedilol  DM 2  CBGs in the 200s We will start patient on SSI low-dose  CAD Continue carvedilol and atorvastatin  HFpEF Not decompensated, continue carvedilol and further management by HD as warranted  Seizure  disorder Continue Keppra  Anxiety and depression Continue mirtazapine   DVT prophylaxis: Geralynn Ochs is doses of heparin started today Code Status: DNR Family Communication: Patient stated no need to contact family Disposition Plan: SNF   Consultants:  Renal   GI  Procedures:  Hemodialysis  Antimicrobials:  None  Data Reviewed: Basic Metabolic Panel: Recent Labs  Lab 05/01/19 1321 05/02/19 0353  NA 138 138  K 3.6 3.7  CL 95* 94*  CO2 28 25  GLUCOSE 101* 100*  BUN 32* 37*  CREATININE 5.76* 6.71*  CALCIUM 7.7* 7.4*   Liver Function Tests: Recent Labs  Lab 05/01/19 1735 05/02/19 0353  AST 26 26  ALT 22 21  ALKPHOS 94 95  BILITOT 0.7 1.0  PROT 6.3* 6.8  ALBUMIN 2.8* 2.8*   No results for input(s): LIPASE, AMYLASE in the last 168 hours. No results for input(s): AMMONIA in the last 168 hours. CBC: Recent Labs  Lab 05/01/19 1321 05/02/19 0353 05/02/19 1210  WBC 4.0 4.8 4.6  NEUTROABS  --  4.1  --   HGB 5.9* 9.3*   9.5* 10.1*  HCT 20.6* 29.3*   29.8* 31.5*  MCV 98.1 93.6 92.1  PLT 133* 127* 129*   Cardiac Enzymes: No results for input(s): CKTOTAL, CKMB, CKMBINDEX, TROPONINI in the last 168 hours. BNP (last 3 results) No results for input(s): PROBNP in the last 8760 hours. CBG: No results for input(s): GLUCAP in the last 168 hours.  Recent Results (from the past 240 hour(s))  Respiratory Panel by RT PCR (Flu A&B, Covid) - Nasopharyngeal Swab     Status: Abnormal   Collection Time: 05/01/19  3:16 PM   Specimen: Nasopharyngeal Swab  Result Value Ref Range Status   SARS Coronavirus 2 by RT PCR POSITIVE (A) NEGATIVE Final    Comment: RESULT CALLED TO, READ BACK BY AND VERIFIED WITH: JENNIFER WHITLEY AT 1622 05/01/19.PMF (NOTE) SARS-CoV-2 target nucleic acids are DETECTED. SARS-CoV-2 RNA is generally detectable in upper respiratory specimens  during the acute phase of infection. Positive results are indicative of the presence of the identified virus,  but do not rule out bacterial infection or co-infection with other pathogens not detected by the test. Clinical correlation with patient history and other diagnostic information is necessary to determine patient infection status. The expected result is Negative. Fact Sheet for Patients:  PinkCheek.be Fact Sheet for Healthcare Providers: GravelBags.it This test is not yet approved or cleared by the [place] FDA and  has been authorized for detection and/or diagnosis of SARS-CoV-2 by FDA under an Emergency Use Authorization (EUA).  This EUA will remain in effect (meaning this test can be used) f or the duration of  the COVID-19 declaration under Section 564(b)(1) of the Act, 21 U.S.C. section 360bbb-3(b)(1), unless the authorization is terminated or revoked sooner.    Influenza A by PCR NEGATIVE NEGATIVE Final   Influenza B by PCR NEGATIVE NEGATIVE Final    Comment: (NOTE) The Xpert Xpress SARS-CoV-2/FLU/RSV assay is intended as an aid in  the diagnosis of influenza from Nasopharyngeal swab specimens  and  should not be used as a sole basis for treatment. Nasal washings and  aspirates are unacceptable for Xpert Xpress SARS-CoV-2/FLU/RSV  testing. Fact Sheet for Patients: PinkCheek.be Fact Sheet for Healthcare Providers: GravelBags.it This test is not yet approved or cleared by the [place] FDA and  has been authorized for detection and/or diagnosis of SARS-CoV-2 by  FDA under an Emergency Use Authorization (EUA). This EUA will remain  in effect (meaning this test can be used) for the duration of the  Covid-19 declaration under Section 564(b)(1) of the Act, 21  U.S.C. section 360bbb-3(b)(1), unless the authorization is  terminated or revoked. Performed at [facility], [address] [place], [place] [address]   MRSA PCR Screening     Status:  Abnormal   Collection Time: 05/02/19  6:26 AM   Specimen: Nasopharyngeal  Result Value Ref Range Status   MRSA by PCR POSITIVE (A) NEGATIVE Final    Comment:        The GeneXpert MRSA Assay (FDA approved for NASAL specimens only), is one component of a comprehensive MRSA colonization surveillance program. It is not intended to diagnose MRSA infection nor to guide or monitor treatment for MRSA infections. RESULT CALLED TO, READ BACK BY AND VERIFIED WITH: ANNABELLA ROBINSON AT 0840 ON 05/02/2019 [place]. Performed at [facility], [address] [place], [place] [address]          Studies: CT ABDOMEN PELVIS WO CONTRAST  Addendum Date: 05/01/2019   ADDENDUM REPORT: 05/01/2019 15:43 ADDENDUM: The FINDINGS and RECOMMENDATION incorrectly state that no radiopaque gallstones are identified. Small gallstones/gallbladder sludge are seen layering dependently within the gallbladder. There remains no evidence of biliary dilatation. Electronically Signed   By: Zerita Boers M.D.   On: 05/01/2019 15:43   Result Date: 05/01/2019 CLINICAL DATA:  Status post trauma and mid back pain. Concern for retroperitoneal bleed. Patient is anemic. EXAM: CT ABDOMEN AND PELVIS WITHOUT CONTRAST TECHNIQUE: Multidetector CT imaging of the abdomen and pelvis was performed following the standard protocol without IV contrast. COMPARISON:  CT abdomen pelvis dated 10/17/2016 FINDINGS: Lower chest: Calcified pleural scarring is seen in the lung bases, unchanged. There is bibasilar atelectasis. The heart is enlarged. Hepatobiliary: No focal liver abnormality is seen. The gallbladder wall is thickened with a small amount of pericholecystic fluid. No radiopaque gallstones or biliary dilatation. Pancreas: Unremarkable. No pancreatic ductal dilatation or surrounding inflammatory changes. Spleen: Normal in size without focal abnormality. Adrenals/Urinary Tract: Adrenal glands are unremarkable. Kidneys are normal, without renal  calculi, focal lesion, or hydronephrosis. A 7 mm calculus is seen in the upper right ureter. There is no hydroureter on either side. Bladder is incompletely distended. Stomach/Bowel: Stomach is within normal limits. Appendix appears normal. There is colonic diverticulosis without evidence of diverticulitis. No evidence of bowel wall thickening, distention, or inflammatory changes. Vascular/Lymphatic: Extensive aortic atherosclerosis. No enlarged abdominal or pelvic lymph nodes. Reproductive: Status post hysterectomy. No adnexal masses. Other: Moderate ascites is noted. A fat containing ventral abdominal wall hernia is not significantly changed since 10/17/2016. no evidence of retroperitoneal bleed. Musculoskeletal: Degenerative changes are seen in the spine. A chronic fracture is seen of the inferior left pubic ramus. No acute or significant osseous findings. IMPRESSION: 1. Gallbladder wall thickening with a small amount of pericholecystic fluid is suspicious for acute cholecystitis. No radiopaque gallstones or biliary dilatation. 2. Moderate ascites. Aortic Atherosclerosis (ICD10-I70.0). Electronically Signed: By: Zerita Boers M.D. On: 05/01/2019 15:33   CT Head Wo Contrast  Result Date: 05/01/2019 CLINICAL DATA:  Head and neck trauma after a fall. EXAM: CT HEAD WITHOUT CONTRAST CT CERVICAL SPINE WITHOUT CONTRAST TECHNIQUE: Multidetector CT imaging of the head and cervical spine was performed following the standard protocol without intravenous contrast. Multiplanar CT image reconstructions of the cervical spine were also generated. COMPARISON:  CT head dated 07/09/2018 FINDINGS: CT HEAD FINDINGS Brain: No evidence of acute infarction, hemorrhage, hydrocephalus, extra-axial collection or mass lesion/mass effect. Periventricular white matter hypoattenuation likely represents chronic small vessel ischemic disease. Vascular: There are vascular calcifications in the carotid siphons. Skull: Normal. Negative for  fracture or focal lesion. Sinuses/Orbits: Moderate to severe sinus disease in the bilateral maxillary, ethmoid, sphenoid, and frontal sinuses. Other: None. CT CERVICAL SPINE FINDINGS Alignment: Normal. Skull base and vertebrae: No acute fracture. No primary bone lesion or focal pathologic process. Soft tissues and spinal canal: No prevertebral fluid or swelling. No visible canal hematoma. Disc levels: Varying degrees of multilevel degenerative disc and joint disease are seen in the cervical spine. Upper chest: Negative. Other: None. IMPRESSION: 1. No acute intracranial process. 2. No acute osseous injury in the cervical spine. 3. Moderate to severe paranasal sinus disease. Electronically Signed   By: Zerita Boers M.D.   On: 05/01/2019 15:41   CT Cervical Spine Wo Contrast  Result Date: 05/01/2019 CLINICAL DATA:  Head and neck trauma after a fall. EXAM: CT HEAD WITHOUT CONTRAST CT CERVICAL SPINE WITHOUT CONTRAST TECHNIQUE: Multidetector CT imaging of the head and cervical spine was performed following the standard protocol without intravenous contrast. Multiplanar CT image reconstructions of the cervical spine were also generated. COMPARISON:  CT head dated 07/09/2018 FINDINGS: CT HEAD FINDINGS Brain: No evidence of acute infarction, hemorrhage, hydrocephalus, extra-axial collection or mass lesion/mass effect. Periventricular white matter hypoattenuation likely represents chronic small vessel ischemic disease. Vascular: There are vascular calcifications in the carotid siphons. Skull: Normal. Negative for fracture or focal lesion. Sinuses/Orbits: Moderate to severe sinus disease in the bilateral maxillary, ethmoid, sphenoid, and frontal sinuses. Other: None. CT CERVICAL SPINE FINDINGS Alignment: Normal. Skull base and vertebrae: No acute fracture. No primary bone lesion or focal pathologic process. Soft tissues and spinal canal: No prevertebral fluid or swelling. No visible canal hematoma. Disc levels: Varying  degrees of multilevel degenerative disc and joint disease are seen in the cervical spine. Upper chest: Negative. Other: None. IMPRESSION: 1. No acute intracranial process. 2. No acute osseous injury in the cervical spine. 3. Moderate to severe paranasal sinus disease. Electronically Signed   By: Zerita Boers M.D.   On: 05/01/2019 15:41   DG Chest Portable 1 View  Result Date: 05/01/2019 CLINICAL DATA:  Decreased hemoglobin. EXAM: PORTABLE CHEST 1 VIEW COMPARISON:  Single-view of the chest 07/08/2018. FINDINGS: There is bilateral mid and lower lung zone airspace disease. No pneumothorax or pleural fluid. Cardiomegaly. Atherosclerosis is identified. Dialysis catheter is unchanged. IMPRESSION: Bilateral mid and lower lung zone airspace disease has an appearance most worrisome for pneumonia. Cardiomegaly. Atherosclerosis. Electronically Signed   By: Inge Rise M.D.   On: 05/01/2019 14:58   US Abdomen Limited RUQ  Result Date: 05/01/2019 CLINICAL DATA:  Cholelithiasis. Patient denies abdominal pain/symptoms. EXAM: ULTRASOUND ABDOMEN LIMITED RIGHT UPPER QUADRANT COMPARISON:  None. FINDINGS: Gallbladder: The gallbladder wall demonstrates a thickened edematous wall measuring up to 7.5 mm. Mobile stone is seen in the gallbladder. There are probably tiny stones within the sludge. No Murphy's sign identified. Common bile duct: Diameter: 2.8 mm Liver: Suggested nodular contour. No focal mass. Portal  vein is patent on color Doppler imaging with normal direction of blood flow towards the liver. Other: Ascites identified superiorly in the abdomen. IMPRESSION: 1. The gallbladder wall is thickened and edematous. Stones and sludge are seen in the gallbladder. No Murphy's sign noted. The findings are not specific. Acute cholecystitis not excluded on these images. If the clinical picture remains ambiguous, a HIDA scan could further evaluate. 2. The common bile duct is normal. 3. Suggested nodular contour to the liver  raises the possibility of cirrhotic change. Recommend clinical correlation. No mass in the liver. Electronically Signed   By: Dorise Bullion III M.D   On: 05/01/2019 16:48        Scheduled Meds:  atorvastatin  80 mg Oral QHS   brimonidine  1 drop Both Eyes BID   carvedilol  25 mg Oral BID   Chlorhexidine Gluconate Cloth  6 each Topical Q0600   dexamethasone (DECADRON) injection  6 mg Intravenous Q24H   docusate sodium  100 mg Oral BID   [START ON 05/03/2019] famotidine  10 mg Oral Daily   hydrALAZINE  25 mg Oral TID   [START ON 05/04/2019] levETIRAcetam  250 mg Oral Q T,Th,Sa-HD   levETIRAcetam  500 mg Oral BID   mirtazapine  7.5 mg Oral Daily   mupirocin ointment  1 application Nasal BID   polyvinyl alcohol  1 drop Both Eyes BID   rOPINIRole  0.25 mg Oral QHS   sevelamer carbonate  1,600 mg Oral TID WC   timolol  1 drop Both Eyes BID   vitamin B-12  1,000 mcg Oral Daily   Continuous Infusions:  [START ON 05/03/2019] remdesivir 100 mg in NS 100 mL      Principal Problem:   Severe anemia Active Problems:   Coronary artery disease involving left main coronary artery   ESRD (end stage renal disease) on dialysis ([place])   Pneumonia due to COVID-19 virus    Time spent: [place], MD, FACP, [facility]. [facility]  If 7PM-7AM, please contact night-coverage [email or web address] Password [email or web address] 05/02/2019, 3:21 PM    LOS: 1 day

## 2019-05-02 NOTE — ED Notes (Signed)
Pt on 3L [place]

## 2019-05-02 NOTE — ED Notes (Signed)
Pt removed [place] and sats were in mid 60s, [place] reapplied at 5L pt in low 80s, NRB applied at this time. Admittign MD aware, no further orders at thsi time

## 2019-05-02 NOTE — Progress Notes (Signed)
Pre HD Assessment   05/02/19 1145  Neurological  Level of Consciousness Alert  Orientation Level Oriented to person;Oriented to place;Disoriented to time;Oriented to situation  Respiratory  Respiratory Pattern Regular;Unlabored  Chest Assessment Chest expansion symmetrical  Bilateral Breath Sounds Diminished  Cough Non-productive  Cardiac  Pulse Irregular  Heart Sounds Distant  Jugular Venous Distention (JVD) No  ECG Monitor Yes  Cardiac Rhythm NSR;BBB  Antiarrhythmic device No  Vascular  R Radial Pulse +2  L Radial Pulse +2  R Posterior Tibial Pulse  (Bilateral amputation )  L Posterior Tibial Pulse  (Bilateral amputation )  Edema Generalized  Generalized Edema  (Non-Pitting)  Integumentary  Integumentary (WDL) X  Skin Color Appropriate for ethnicity (Dry)  Skin Condition Dry  Musculoskeletal  Musculoskeletal (WDL) X  Generalized Weakness Yes  Gastrointestinal  Bowel Sounds Assessment Hypoactive  Last BM Date 05/02/19  GU Assessment  Genitourinary (WDL) X  Genitourinary Symptoms Anuria (ESRD pT)  Psychosocial  Psychosocial (WDL) X  Patient Behaviors Calm;Cooperative;Tearful  Needs Expressed Emotional  Emotional support given Given to patient

## 2019-05-02 NOTE — Plan of Care (Signed)
  Problem: Clinical Measurements: Goal: Ability to maintain clinical measurements within normal limits will improve Outcome: Progressing Goal: Will remain free from infection Outcome: Progressing Goal: Diagnostic test results will improve Outcome: Progressing Goal: Respiratory complications will improve Outcome: Progressing Goal: Cardiovascular complication will be avoided Outcome: Progressing  Pt is tolerating well HD Tx. And receiving  Albumin as per nephrologist order. BP WDL. Will keep monitoring

## 2019-05-02 NOTE — Progress Notes (Signed)
Post HD tx Assessment   05/02/19 1446  Neurological  Level of Consciousness Alert  Orientation Level Oriented to person;Oriented to place;Disoriented to time;Oriented to situation  Respiratory  Respiratory Pattern Regular;Unlabored  Chest Assessment Chest expansion symmetrical  Bilateral Breath Sounds Diminished  Cardiac  Pulse Irregular  Heart Sounds Distant  Jugular Venous Distention (JVD) No  ECG Monitor Yes  Cardiac Rhythm NSR;BBB  Antiarrhythmic device No  Vascular  R Radial Pulse +2  L Radial Pulse +2  R Posterior Tibial Pulse  (Bilateral amputation )  L Posterior Tibial Pulse  (Bilateral amputation )  Edema Generalized  Generalized Edema  (Non-Pitting)  Integumentary  Integumentary (WDL) X  Skin Color Appropriate for ethnicity (Dry)  Skin Condition Dry  Musculoskeletal  Musculoskeletal (WDL) X  Generalized Weakness Yes  Gastrointestinal  Bowel Sounds Assessment Hypoactive  GU Assessment  Genitourinary (WDL) X  Genitourinary Symptoms Anuria (ESRD pT)  Psychosocial  Psychosocial (WDL) X  Patient Behaviors Calm;Cooperative;Agitated  Needs Expressed Emotional  Emotional support given Given to patient

## 2019-05-03 LAB — COMPREHENSIVE METABOLIC PANEL
ALT: 18 U/L (ref 0–44)
AST: 19 U/L (ref 15–41)
Albumin: 3 g/dL — ABNORMAL LOW (ref 3.5–5.0)
Alkaline Phosphatase: 104 U/L (ref 38–126)
Anion gap: 15 (ref 5–15)
BUN: 38 mg/dL — ABNORMAL HIGH (ref 8–23)
CO2: 29 mmol/L (ref 22–32)
Calcium: 7.9 mg/dL — ABNORMAL LOW (ref 8.9–10.3)
Chloride: 95 mmol/L — ABNORMAL LOW (ref 98–111)
Creatinine, Ser: 5.38 mg/dL — ABNORMAL HIGH (ref 0.44–1.00)
GFR calc Af Amer: 9 mL/min — ABNORMAL LOW (ref >60)
GFR calc non Af Amer: 8 mL/min — ABNORMAL LOW (ref >60)
Glucose, Bld: 266 mg/dL — ABNORMAL HIGH (ref 70–99)
Potassium: 4.8 mmol/L (ref 3.5–5.1)
Sodium: 139 mmol/L (ref 135–145)
Total Bilirubin: 0.8 mg/dL (ref 0.3–1.2)
Total Protein: 7.4 g/dL (ref 6.5–8.1)

## 2019-05-03 LAB — CBC WITH DIFFERENTIAL/PLATELET
Abs Immature Granulocytes: 0.03 10*3/uL (ref 0.00–0.07)
Basophils Absolute: 0 10*3/uL (ref 0.0–0.1)
Basophils Relative: 0 %
Eosinophils Absolute: 0 10*3/uL (ref 0.0–0.5)
Eosinophils Relative: 0 %
HCT: 41 % (ref 36.0–46.0)
Hemoglobin: 13 g/dL (ref 12.0–15.0)
Immature Granulocytes: 1 %
Lymphocytes Relative: 10 %
Lymphs Abs: 0.4 10*3/uL — ABNORMAL LOW (ref 0.7–4.0)
MCH: 29.6 pg (ref 26.0–34.0)
MCHC: 31.7 g/dL (ref 30.0–36.0)
MCV: 93.4 fL (ref 80.0–100.0)
Monocytes Absolute: 0.2 10*3/uL (ref 0.1–1.0)
Monocytes Relative: 4 %
Neutro Abs: 3.7 10*3/uL (ref 1.7–7.7)
Neutrophils Relative %: 85 %
Platelets: 145 10*3/uL — ABNORMAL LOW (ref 150–400)
RBC: 4.39 MIL/uL (ref 3.87–5.11)
RDW: 17.8 % — ABNORMAL HIGH (ref 11.5–15.5)
WBC: 4.4 10*3/uL (ref 4.0–10.5)
nRBC: 0.5 % — ABNORMAL HIGH (ref 0.0–0.2)

## 2019-05-03 LAB — GLUCOSE, CAPILLARY
Glucose-Capillary: 125 mg/dL — ABNORMAL HIGH (ref 70–99)
Glucose-Capillary: 225 mg/dL — ABNORMAL HIGH (ref 70–99)
Glucose-Capillary: 325 mg/dL — ABNORMAL HIGH (ref 70–99)
Glucose-Capillary: 276 mg/dL — ABNORMAL HIGH (ref 70–99)
Glucose-Capillary: 238 mg/dL — ABNORMAL HIGH (ref 70–99)

## 2019-05-03 LAB — MAGNESIUM: Magnesium: 2.1 mg/dL (ref 1.7–2.4)

## 2019-05-03 LAB — C-REACTIVE PROTEIN: CRP: 11 mg/dL — ABNORMAL HIGH (ref <1.0)

## 2019-05-03 MED ORDER — APIXABAN 2.5 MG PO TABS
2.5000 mg | ORAL_TABLET | Freq: Two times a day (BID) | ORAL | Status: DC
  Administered 2019-05-03 – 2019-05-04 (×2): 2.5 mg via ORAL
  Filled 2019-05-03 (×2): qty 1

## 2019-05-03 NOTE — Progress Notes (Signed)
Inpatient Diabetes Program Recommendations  AACE/ADA: New Consensus Statement on Inpatient Glycemic Control (2015)  Target Ranges:  Prepandial:   less than 140 mg/dL      Peak postprandial:   less than 180 mg/dL (1-2 hours)      Critically ill patients:  140 - 180 mg/dL   Results for Stephanie Ellis, PEADEN (MRN [id number]) as of 05/03/2019 14:16  Ref. Range 05/02/2019 16:27 05/02/2019 21:16 05/03/2019 08:06 05/03/2019 12:40  Glucose-Capillary Latest Ref Range: 70 - 99 mg/dL 133 (H) 125 (H) 225 (H)  2  units NOVOLOG  325 (H)  4 units NOVOLOG     Admit with: Anemia r/o GIB/ Pneumonia secondary to COVID-19  History: DM, CHF, ESRD  Home DM Meds: Lantus 11 units QHS       Novolog 3-7 units TID per SSI  Current Orders: Novolog 0-6 units TID with meals     Getting Decadron 6 mg Daily.  CBGs elevated today.   MD- Please consider starting Lantus 11 units QHS (home dose)     --Will follow patient during hospitalization--  Wyn Quaker RN, MSN, CDE Diabetes Coordinator Inpatient Glycemic Control Team Team Pager: [phone] (8a-5p)

## 2019-05-03 NOTE — Progress Notes (Signed)
[facility]  ROUNDING NOTE   Subjective:   Hemodialysis treatment yesterday. Tolerated treatment well. UF of 1 liter.   Hemoglobin remains stable. GI has signed off.   Objective:  Vital signs in last 24 hours:  Temp:  [97.5 F (36.4 C)-98.1 F (36.7 C)] 97.7 F (36.5 C) (01/11 1242) Pulse Rate:  [51-58] 51 (01/11 1242) Resp:  [17] 17 (01/11 1242) BP: (119-170)/(50-60) 142/51 (01/11 1242) SpO2:  [69 %-100 %] 100 % (01/11 1242)  Weight change:  Filed Weights   05/01/19 1313 05/02/19 0249  Weight: 53.5 kg 55.5 kg    Intake/Output: I/O last 3 completed shifts: In: 1300 [P.O.:120; Blood:1080; IV Piggyback:100] Out: 1002 [Other:1000; Stool:2]   Intake/Output this shift:  No intake/output data recorded.  Physical Exam: General: NAD,   Head: Normocephalic, atraumatic. Moist oral mucosal membranes  Eyes: Anicteric, PERRL  Neck: Supple, trachea midline  Lungs:  Clear to auscultation  Heart: Regular rate and rhythm  Abdomen:  Soft, nontender,   Extremities:  bilateral BKA  Neurologic: Nonfocal, moving all four extremities  Skin: No lesions  Access: Left permcath    Basic Metabolic Panel: Recent Labs  Lab 05/01/19 1321 05/02/19 0353 05/03/19 0744  NA 138 138 139  K 3.6 3.7 4.8  CL 95* 94* 95*  CO2 28 25 29   GLUCOSE 101* 100* 266*  BUN 32* 37* 38*  CREATININE 5.76* 6.71* 5.38*  CALCIUM 7.7* 7.4* 7.9*  MG  --   --  2.1    Liver Function Tests: Recent Labs  Lab 05/01/19 1735 05/02/19 0353 05/03/19 0744  AST 26 26 19   ALT 22 21 18   ALKPHOS 94 95 104  BILITOT 0.7 1.0 0.8  PROT 6.3* 6.8 7.4  ALBUMIN 2.8* 2.8* 3.0*   No results for input(s): LIPASE, AMYLASE in the last 168 hours. No results for input(s): AMMONIA in the last 168 hours.  CBC: Recent Labs  Lab 05/01/19 1321 05/02/19 0353 05/02/19 1210 05/03/19 0744  WBC 4.0 4.8 4.6 4.4  NEUTROABS  --  4.1  --  3.7  HGB 5.9* 9.3*  9.5* 10.1* 13.0  HCT 20.6* 29.3*  29.8* 31.5* 41.0   MCV 98.1 93.6 92.1 93.4  PLT 133* 127* 129* 145*    Cardiac Enzymes: No results for input(s): CKTOTAL, CKMB, CKMBINDEX, TROPONINI in the last 168 hours.  BNP: Invalid input(s): POCBNP  CBG: Recent Labs  Lab 05/02/19 1627 05/02/19 2116 05/03/19 0806 05/03/19 1240  GLUCAP 133* 125* 225* 325*    Microbiology: Results for orders placed or performed during the hospital encounter of 05/01/19  Respiratory Panel by RT PCR (Flu A&B, Covid) - Nasopharyngeal Swab     Status: Abnormal   Collection Time: 05/01/19  3:16 PM   Specimen: Nasopharyngeal Swab  Result Value Ref Range Status   SARS Coronavirus 2 by RT PCR POSITIVE (A) NEGATIVE Final    Comment: RESULT CALLED TO, READ BACK BY AND VERIFIED WITH: JENNIFER WHITLEY AT 1622 05/01/19.PMF (NOTE) SARS-CoV-2 target nucleic acids are DETECTED. SARS-CoV-2 RNA is generally detectable in upper respiratory specimens  during the acute phase of infection. Positive results are indicative of the presence of the identified virus, but do not rule out bacterial infection or co-infection with other pathogens not detected by the test. Clinical correlation with patient history and other diagnostic information is necessary to determine patient infection status. The expected result is Negative. Fact Sheet for Patients:  PinkCheek.be Fact Sheet for Healthcare Providers: GravelBags.it This test is not yet approved  or cleared by the [place] and  has been authorized for detection and/or diagnosis of SARS-CoV-2 by FDA under an Emergency Use Authorization (EUA).  This EUA will remain in effect (meaning this test can be used) f or the duration of  the COVID-19 declaration under Section 564(b)(1) of the Act, 21 U.S.C. section 360bbb-3(b)(1), unless the authorization is terminated or revoked sooner.    Influenza A by PCR NEGATIVE NEGATIVE Final   Influenza B by PCR NEGATIVE NEGATIVE Final     Comment: (NOTE) The Xpert Xpress SARS-CoV-2/FLU/RSV assay is intended as an aid in  the diagnosis of influenza from Nasopharyngeal swab specimens and  should not be used as a sole basis for treatment. Nasal washings and  aspirates are unacceptable for Xpert Xpress SARS-CoV-2/FLU/RSV  testing. Fact Sheet for Patients: PinkCheek.be Fact Sheet for Healthcare Providers: GravelBags.it This test is not yet approved or cleared by the [place] FDA and  has been authorized for detection and/or diagnosis of SARS-CoV-2 by  FDA under an Emergency Use Authorization (EUA). This EUA will remain  in effect (meaning this test can be used) for the duration of the  Covid-19 declaration under Section 564(b)(1) of the Act, 21  U.S.C. section 360bbb-3(b)(1), unless the authorization is  terminated or revoked. Performed at [facility], [address] [place], [place] [address]   MRSA PCR Screening     Status: Abnormal   Collection Time: 05/02/19  6:26 AM   Specimen: Nasopharyngeal  Result Value Ref Range Status   MRSA by PCR POSITIVE (A) NEGATIVE Final    Comment:        The GeneXpert MRSA Assay (FDA approved for NASAL specimens only), is one component of a comprehensive MRSA colonization surveillance program. It is not intended to diagnose MRSA infection nor to guide or monitor treatment for MRSA infections. RESULT CALLED TO, READ BACK BY AND VERIFIED WITH: ANNABELLA ROBINSON AT 0840 ON 05/02/2019 [place]. Performed at [facility], [address] [place], [place] [address]     Coagulation Studies: Recent Labs    05/01/19 1325  LABPROT 17.1*  INR 1.4*    Urinalysis: No results for input(s): COLORURINE, LABSPEC, PHURINE, GLUCOSEU, HGBUR, BILIRUBINUR, KETONESUR, PROTEINUR, UROBILINOGEN, NITRITE, LEUKOCYTESUR in the last 72 hours.  Invalid input(s): APPERANCEUR    Imaging: US Abdomen Limited  RUQ  Result Date: 05/01/2019 CLINICAL DATA:  Cholelithiasis. Patient denies abdominal pain/symptoms. EXAM: ULTRASOUND ABDOMEN LIMITED RIGHT UPPER QUADRANT COMPARISON:  None. FINDINGS: Gallbladder: The gallbladder wall demonstrates a thickened edematous wall measuring up to 7.5 mm. Mobile stone is seen in the gallbladder. There are probably tiny stones within the sludge. No Murphy's sign identified. Common bile duct: Diameter: 2.8 mm Liver: Suggested nodular contour. No focal mass. Portal vein is patent on color Doppler imaging with normal direction of blood flow towards the liver. Other: Ascites identified superiorly in the abdomen. IMPRESSION: 1. The gallbladder wall is thickened and edematous. Stones and sludge are seen in the gallbladder. No Murphy's sign noted. The findings are not specific. Acute cholecystitis not excluded on these images. If the clinical picture remains ambiguous, a HIDA scan could further evaluate. 2. The common bile duct is normal. 3. Suggested nodular contour to the liver raises the possibility of cirrhotic change. Recommend clinical correlation. No mass in the liver. Electronically Signed   By: Dorise Bullion III M.D   On: 05/01/2019 16:48     Medications:   . remdesivir 100 mg in NS 100 mL 100 mg (  05/03/19 0831)   . atorvastatin  80 mg Oral QHS  . brimonidine  1 drop Both Eyes BID  . carvedilol  25 mg Oral BID  . Chlorhexidine Gluconate Cloth  6 each Topical Q0600  . dexamethasone (DECADRON) injection  6 mg Intravenous Q24H  . docusate sodium  100 mg Oral BID  . famotidine  10 mg Oral Daily  . heparin  5,000 Units Subcutaneous Q8H  . hydrALAZINE  25 mg Oral TID  . insulin aspart  0-6 Units Subcutaneous TID WC  . [START ON 05/04/2019] levETIRAcetam  250 mg Oral Q T,Th,Sa-HD  . levETIRAcetam  500 mg Oral BID  . mirtazapine  7.5 mg Oral Daily  . mupirocin ointment  1 application Nasal BID  . polyvinyl alcohol  1 drop Both Eyes BID  . rOPINIRole  0.25 mg Oral QHS  .  sevelamer carbonate  1,600 mg Oral TID WC  . timolol  1 drop Both Eyes BID  . vitamin B-12  1,000 mcg Oral Daily     Assessment/ Plan:  Ms. Juana Haralson is a 69 y.o. black female with end stage renal disease on hemodialysis, hypertension, peripheral vascular disease, bilateral below the knee amputations, coronary artery disease status post CABG, diabetes mellitus type II insulin dependent, history of endocarditis, congestive heart failure who is admitted to [facility] on 05/01/2019 for Low hemoglobin [D64.9] Cholelithiasis [K80.20] Severe anemia [D64.9] Anemia, unspecified type [D64.9]  [facility] TTS [address] permcath  1. End Stage Renal Disease on hemodialysis: missed dialysis Saturday. Back up treatment yesterday, Sunday.  - next treatment for tomorrow.   2. Anemia with renal failure: hemoglobin 5.9 on admission. Status post 2 units PRBC.  Appreciate GI input.    3. Hypertension:  Home regimen of carvedilol and hydralazine.  4. Secondary Hyperparathyroidism - sevelamer   LOS: 2 Lum Stillinger 1/11/20214:33 PM

## 2019-05-03 NOTE — Progress Notes (Signed)
PROGRESS NOTE    Stephanie Ellis  MWN:[id number]  DOB: May 01, 1950  DOA: 05/01/2019 PCP: Kirk Ruths, MD Outpatient Specialists:   Hospital course:  Stephanie Ellis is an 69 y.o. female with medical history significant for chronic diastolic CHF, anemia, carotid artery disease, CAD, PVD status post bilateral BKA end-stage renal disease on hemodialysis on Tuesdays Thursdays and Saturdays, hypertension, GERD.  She was was referred to the ED from outpatient dialysis where she was noted to have a hemoglobin of 5.8.  Subjective:  She states that she feels well today, is not quite sure why she is still here, feels at baseline. Would like to go home.   Objective: Vitals:   05/02/19 2055 05/02/19 2058 05/03/19 0531 05/03/19 1242  BP: (!) 119/50  (!) 170/60 (!) 142/51  Pulse: (!) 58 (!) 56 (!) 51 (!) 51  Resp: 17  17 17   Temp: 98.1 F (36.7 C)  (!) 97.5 F (36.4 C) 97.7 F (36.5 C)  TempSrc: Oral  Oral Oral  SpO2: (!) 69% 93% 100% 100%  Weight:      Height:       No intake or output data in the 24 hours ending 05/03/19 1809 Filed Weights   05/01/19 1313 05/02/19 0249  Weight: 53.5 kg 55.5 kg    Exam:  General: Patient sitting up in bed with good energy in NAD Constitutional: Alert and awake, oriented x3, not in any acute distress. Eyes: sclera anicteric, conjuctiva clear, no lid lag, no exophthalmos, EOMI CVS: S1-S2 clear, no murmur rubs or gallops, no LE edema, normal pedal pulses  Respiratory:  normal effort, symmetrical excursion, CTA without adventitious sounds.  GI: NABS, soft, NT, ND, no palpable masses.  LE: Status post bilateral BKA  Assessment & Plan:   Patient is a 69 year old female with ESRD on HD admitted from hemodialysis for severe symptomatic anemia, status post transfusion 05/01/2019.  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  Patient was noted to be guaiac negative in the ED.   She was also noted to have incidental gallbladder thickening on CT witH normal abdominal exam.  Chest x-ray revealed patchy infiltrates consistent with pneumonia and patient did have an oxygen requirement.  Patient had been diagnosed with Covid positivity on 04/19/2020 at her SNF.  Anemia r/o GIB Unclear etiology of decrease in hemoglobin to 6 on admission 05/01/2019 Abdominal pelvic CT without evidence of retroperitoneal abscess Stool guaiacs are negative GI has been consulted who has recommended endoscopic work-up as an outpatient Eliquis has been held--we presume patient is on Eliquis for PVD Hg continue to rise, now 13 without further transfusion ? Fluid shifts  Pneumonia secondary to COVID-19 We will treat with dexamethasone day #3 of 10 No remdesivir given end-stage renal disease per renal Only on 3 L oxygen to keep O2 sats greater than 92% CRP was 8.4, calcitonin 0.9  Abnormal gallbladder on CT No clinical or laboratory evidence of acute cholecystitis  ESRD on HD HD as per nephrology service who knows her well  PVD Continue atorvastatin and carvedilol Restart Eliquis  HTN Well-controlled on carvedilol  DM 2  CBGs in the 200s We will start patient on SSI low-dose  CAD Continue carvedilol and atorvastatin  HFpEF Not decompensated, continue carvedilol and further management by HD as warranted  Seizure disorder Continue Keppra  Anxiety and depression Continue mirtazapine   DVT prophylaxis: On Eliquis Code Status: DNR  Family Communication: Patient stated no need to contact family Disposition Plan: dc home tomorrow   Consultants:  Renal   GI  Procedures:  Hemodialysis  Antimicrobials:  None  Data Reviewed: Basic Metabolic Panel: Recent Labs  Lab 05/01/19 1321 05/02/19 0353 05/03/19 0744  NA 138 138 139  K 3.6 3.7 4.8  CL 95* 94* 95*  CO2 28 25 29   GLUCOSE 101* 100* 266*  BUN 32* 37* 38*  CREATININE 5.76* 6.71* 5.38*  CALCIUM 7.7* 7.4*  7.9*  MG  --   --  2.1   Liver Function Tests: Recent Labs  Lab 05/01/19 1735 05/02/19 0353 05/03/19 0744  AST 26 26 19   ALT 22 21 18   ALKPHOS 94 95 104  BILITOT 0.7 1.0 0.8  PROT 6.3* 6.8 7.4  ALBUMIN 2.8* 2.8* 3.0*   No results for input(s): LIPASE, AMYLASE in the last 168 hours. No results for input(s): AMMONIA in the last 168 hours. CBC: Recent Labs  Lab 05/01/19 1321 05/02/19 0353 05/02/19 1210 05/03/19 0744  WBC 4.0 4.8 4.6 4.4  NEUTROABS  --  4.1  --  3.7  HGB 5.9* 9.3*  9.5* 10.1* 13.0  HCT 20.6* 29.3*  29.8* 31.5* 41.0  MCV 98.1 93.6 92.1 93.4  PLT 133* 127* 129* 145*   Cardiac Enzymes: No results for input(s): CKTOTAL, CKMB, CKMBINDEX, TROPONINI in the last 168 hours. BNP (last 3 results) No results for input(s): PROBNP in the last 8760 hours. CBG: Recent Labs  Lab 05/02/19 1627 05/02/19 2116 05/03/19 0806 05/03/19 1240 05/03/19 1713  GLUCAP 133* 125* 225* 325* 276*    Recent Results (from the past 240 hour(s))  Respiratory Panel by RT PCR (Flu A&B, Covid) - Nasopharyngeal Swab     Status: Abnormal   Collection Time: 05/01/19  3:16 PM   Specimen: Nasopharyngeal Swab  Result Value Ref Range Status   SARS Coronavirus 2 by RT PCR POSITIVE (A) NEGATIVE Final    Comment: RESULT CALLED TO, READ BACK BY AND VERIFIED WITH: JENNIFER WHITLEY AT 1622 05/01/19.PMF (NOTE) SARS-CoV-2 target nucleic acids are DETECTED. SARS-CoV-2 RNA is generally detectable in upper respiratory specimens  during the acute phase of infection. Positive results are indicative of the presence of the identified virus, but do not rule out bacterial infection or co-infection with other pathogens not detected by the test. Clinical correlation with patient history and other diagnostic information is necessary to determine patient infection status. The expected result is Negative. Fact Sheet for Patients:  PinkCheek.be Fact Sheet for Healthcare  Providers: GravelBags.it This test is not yet approved or cleared by the [place] FDA and  has been authorized for detection and/or diagnosis of SARS-CoV-2 by FDA under an Emergency Use Authorization (EUA).  This EUA will remain in effect (meaning this test can be used) f or the duration of  the COVID-19 declaration under Section 564(b)(1) of the Act, 21 U.S.C. section 360bbb-3(b)(1), unless the authorization is terminated or revoked sooner.    Influenza A by PCR NEGATIVE NEGATIVE Final   Influenza B by PCR NEGATIVE NEGATIVE Final    Comment: (NOTE) The Xpert Xpress SARS-CoV-2/FLU/RSV assay is intended as an aid in  the diagnosis of influenza from Nasopharyngeal swab specimens and  should not be used as a sole basis for treatment. Nasal washings and  aspirates are unacceptable for Xpert Xpress SARS-CoV-2/FLU/RSV  testing. Fact Sheet for Patients: PinkCheek.be Fact Sheet for Healthcare Providers: GravelBags.it This test is not yet approved or cleared by the [place]  FDA and  has been authorized for detection and/or diagnosis of SARS-CoV-2 by  FDA under an Emergency Use Authorization (EUA). This EUA will remain  in effect (meaning this test can be used) for the duration of the  Covid-19 declaration under Section 564(b)(1) of the Act, 21  U.S.C. section 360bbb-3(b)(1), unless the authorization is  terminated or revoked. Performed at [facility], [address] [place], [place] [address]   MRSA PCR Screening     Status: Abnormal   Collection Time: 05/02/19  6:26 AM   Specimen: Nasopharyngeal  Result Value Ref Range Status   MRSA by PCR POSITIVE (A) NEGATIVE Final    Comment:        The GeneXpert MRSA Assay (FDA approved for NASAL specimens only), is one component of a comprehensive MRSA colonization surveillance program. It is not intended to diagnose MRSA infection nor  to guide or monitor treatment for MRSA infections. RESULT CALLED TO, READ BACK BY AND VERIFIED WITH: ANNABELLA ROBINSON AT 0840 ON 05/02/2019 [place]. Performed at [facility], [address] [place], [place] [address]          Studies: No results found.      Scheduled Meds: . atorvastatin  80 mg Oral QHS  . brimonidine  1 drop Both Eyes BID  . carvedilol  25 mg Oral BID  . Chlorhexidine Gluconate Cloth  6 each Topical Q0600  . dexamethasone (DECADRON) injection  6 mg Intravenous Q24H  . docusate sodium  100 mg Oral BID  . famotidine  10 mg Oral Daily  . heparin  5,000 Units Subcutaneous Q8H  . hydrALAZINE  25 mg Oral TID  . insulin aspart  0-6 Units Subcutaneous TID WC  . [START ON 05/04/2019] levETIRAcetam  250 mg Oral Q T,Th,Sa-HD  . levETIRAcetam  500 mg Oral BID  . mirtazapine  7.5 mg Oral Daily  . mupirocin ointment  1 application Nasal BID  . polyvinyl alcohol  1 drop Both Eyes BID  . rOPINIRole  0.25 mg Oral QHS  . sevelamer carbonate  1,600 mg Oral TID WC  . timolol  1 drop Both Eyes BID  . vitamin B-12  1,000 mcg Oral Daily   Continuous Infusions: . remdesivir 100 mg in NS 100 mL 100 mg (05/03/19 0831)    Principal Problem:   Severe anemia Active Problems:   Coronary artery disease involving left main coronary artery   ESRD (end stage renal disease) on dialysis ([place])   Pneumonia due to COVID-19 virus    Time spent: [place], MD, FACP, [facility]. [facility]  If 7PM-7AM, please contact night-coverage [email or web address] Password [email or web address] 05/03/2019, 6:09 PM    LOS: 2 days

## 2019-05-03 NOTE — Progress Notes (Signed)
No adequate ascitic fluid to perform diagnostic paracentesis.  Therefore procedure is canceled No acute events overnight, hemoglobin is normal No further GI work-up at this time Follow-up with GI for cirrhosis as outpatient GI will sign off, please call us back with questions or concerns  Sherri Sear, MD

## 2019-05-03 NOTE — Progress Notes (Signed)
Hemodialysis patient known at [facility] TTS 11:00am. Please contact me with any dialysis placement concerns.  Elvera Bicker Dialysis Coordinator [phone]

## 2019-05-04 DIAGNOSIS — E1169 Type 2 diabetes mellitus with other specified complication: Secondary | ICD-10-CM

## 2019-05-04 DIAGNOSIS — K761 Chronic passive congestion of liver: Secondary | ICD-10-CM

## 2019-05-04 DIAGNOSIS — N185 Chronic kidney disease, stage 5: Secondary | ICD-10-CM

## 2019-05-04 LAB — CBC WITH DIFFERENTIAL/PLATELET
Abs Immature Granulocytes: 0.03 10*3/uL (ref 0.00–0.07)
Basophils Absolute: 0 10*3/uL (ref 0.0–0.1)
Basophils Relative: 0 %
Eosinophils Absolute: 0 10*3/uL (ref 0.0–0.5)
Eosinophils Relative: 0 %
HCT: 40 % (ref 36.0–46.0)
Hemoglobin: 12.5 g/dL (ref 12.0–15.0)
Immature Granulocytes: 1 %
Lymphocytes Relative: 8 %
Lymphs Abs: 0.4 10*3/uL — ABNORMAL LOW (ref 0.7–4.0)
MCH: 29.1 pg (ref 26.0–34.0)
MCHC: 31.3 g/dL (ref 30.0–36.0)
MCV: 93 fL (ref 80.0–100.0)
Monocytes Absolute: 0.2 10*3/uL (ref 0.1–1.0)
Monocytes Relative: 4 %
Neutro Abs: 4.7 10*3/uL (ref 1.7–7.7)
Neutrophils Relative %: 87 %
Platelets: 164 10*3/uL (ref 150–400)
RBC: 4.3 MIL/uL (ref 3.87–5.11)
RDW: 17.5 % — ABNORMAL HIGH (ref 11.5–15.5)
WBC: 5.3 10*3/uL (ref 4.0–10.5)
nRBC: 0 % (ref 0.0–0.2)

## 2019-05-04 LAB — COMPREHENSIVE METABOLIC PANEL
ALT: 14 U/L (ref 0–44)
AST: 18 U/L (ref 15–41)
Albumin: 3 g/dL — ABNORMAL LOW (ref 3.5–5.0)
Alkaline Phosphatase: 95 U/L (ref 38–126)
Anion gap: 15 (ref 5–15)
BUN: 61 mg/dL — ABNORMAL HIGH (ref 8–23)
CO2: 28 mmol/L (ref 22–32)
Calcium: 7.8 mg/dL — ABNORMAL LOW (ref 8.9–10.3)
Chloride: 95 mmol/L — ABNORMAL LOW (ref 98–111)
Creatinine, Ser: 6.16 mg/dL — ABNORMAL HIGH (ref 0.44–1.00)
GFR calc Af Amer: 7 mL/min — ABNORMAL LOW (ref >60)
GFR calc non Af Amer: 6 mL/min — ABNORMAL LOW (ref >60)
Glucose, Bld: 260 mg/dL — ABNORMAL HIGH (ref 70–99)
Potassium: 4.5 mmol/L (ref 3.5–5.1)
Sodium: 138 mmol/L (ref 135–145)
Total Bilirubin: 0.7 mg/dL (ref 0.3–1.2)
Total Protein: 6.8 g/dL (ref 6.5–8.1)

## 2019-05-04 LAB — MAGNESIUM: Magnesium: 2.1 mg/dL (ref 1.7–2.4)

## 2019-05-04 LAB — GLUCOSE, CAPILLARY
Glucose-Capillary: 245 mg/dL — ABNORMAL HIGH (ref 70–99)
Glucose-Capillary: 258 mg/dL — ABNORMAL HIGH (ref 70–99)

## 2019-05-04 LAB — C-REACTIVE PROTEIN: CRP: 6.7 mg/dL — ABNORMAL HIGH (ref <1.0)

## 2019-05-04 MED ORDER — PREDNISONE 10 MG PO TABS: 40.0000 mg | ORAL_TABLET | Freq: Every day | ORAL | 0 refills | Status: AC

## 2019-05-04 NOTE — NC FL2 (Signed)
[place] LEVEL OF CARE SCREENING TOOL     IDENTIFICATION  Patient Name: Stephanie Ellis Birthdate: 06/19/1950 Sex: female Admission Date (Current Location): 05/01/2019  [facility] and [place] Number:  Engineering geologist and Address:         Provider Number: [id number]  Attending Physician Name and Address:  Oren Binet*  Relative Name and Phone Number:       Current Level of Care: Hospital Recommended Level of Care: [place] Prior Approval Number:    Date Approved/Denied:   PASRR Number: [id number]  Discharge Plan: SNF    Current Diagnoses: Patient Active Problem List   Diagnosis Date Noted  . 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 Severe anemia 05/01/2019  . Pneumonia due to COVID-19 virus 05/01/2019  . Resides in skilled nursing facility 12/29/2018  . CVA (cerebral vascular accident) ([place]) 07/08/2018  . Infection of amputation stump ([place]) 05/02/2018  . BKA stump complication ([place]) 00/17/4944  . Unilateral complete BKA, right, initial encounter ([place]) 03/30/2018  . Transient loss of consciousness 03/19/2018  . Transient alteration of awareness   . VT (ventricular tachycardia) ([place])   . Disorientation   . Hypertensive urgency 03/08/2018  . Status post below-knee amputation of both lower extremities ([place]) 03/02/2018  . Ischemia of extremity 02/18/2018  . CKD (chronic kidney disease) stage 5, GFR less than 15 ml/min (HCC) 02/16/2018  . Diabetic osteomyelitis ([place])   . Peripheral vascular disease ([place])   . Advanced care planning/counseling discussion   . Palliative care by specialist   . Goals of care, counseling/discussion   . Diabetes mellitus, type 2 ([place]) 01/30/2018  . Sepsis ([place]) 01/29/2018  . Peripheral neuropathy 06/17/2017  . Encephalopathy 02/06/2017  . Hypertensive emergency 02/06/2017  . Endophthalmitis, left eye 12/05/2016  . Vomiting 10/24/2016  . Hematuria 10/17/2016  . Acute lower UTI 10/17/2016  .  Anesthesia complication 96/75/9163  . Chest pain with moderate risk for cardiac etiology 09/04/2016  . Steal syndrome dialysis vascular access, initial encounter ([place]) 06/18/2016  . 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 Coronary artery disease involving coronary bypass graft of native heart with angina pectoris ([place]) 02/10/2016  . Hypertension 02/02/2016  . Hyperlipidemia 02/02/2016  . Coronary artery disease involving left main coronary artery 10/04/2015  . Abdominal pain of unknown etiology 10/04/2015  . ESRD (end stage renal disease) on dialysis ([place])   . Type II diabetes mellitus with complication ([place])   . NSTEMI (non-ST elevated myocardial infarction) ([place])   . Right sided abdominal pain   . Elevated troponin 10/03/2015  . 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 Chronic diastolic congestive heart failure ([place]) 04/19/2014  . Anemia 09/20/2013  . Routine health maintenance 01/29/2013    Orientation RESPIRATION BLADDER Height & Weight     Self, Time  Normal (anuric) Weight: 55.5 kg Height:  5' 4"  (162.6 cm)  BEHAVIORAL SYMPTOMS/MOOD NEUROLOGICAL BOWEL NUTRITION STATUS      Incontinent Diet(renal carb mod)  AMBULATORY STATUS COMMUNICATION OF NEEDS Skin   Extensive Assist Verbally Normal(old bilateral bka)                       Personal Care Assistance Level of Assistance              Functional Limitations Info             SPECIAL CARE FACTORS FREQUENCY  Contractures Contractures Info: Not present    Additional Factors Info  Code Status, Allergies Code Status Info: DNR Allergies Info: Chlorthalidone, Fentanyl, Midazolam, Ace Inhibitors, Angiotensin Receptor Blockers, Norvasc, Phenergan           Current Medications (05/04/2019):  This is the current hospital active medication list Current Facility-Administered Medications  Medication Dose Route Frequency Provider Last Rate Last Admin  . acetaminophen (TYLENOL)  tablet 650 mg  650 mg Oral Q6H PRN Jennye Boroughs, MD   650 mg at 05/02/19 1633  . apixaban (ELIQUIS) tablet 2.5 mg  2.5 mg Oral BID Bonnell Public Tublu, MD   2.5 mg at 05/04/19 0948  . atorvastatin (LIPITOR) tablet 80 mg  80 mg Oral QHS Jennye Boroughs, MD   80 mg at 05/03/19 2203  . brimonidine (ALPHAGAN) 0.2 % ophthalmic solution 1 drop  1 drop Both Eyes BID Jennye Boroughs, MD   1 drop at 05/04/19 0956  . carvedilol (COREG) tablet 25 mg  25 mg Oral BID Jennye Boroughs, MD   25 mg at 05/03/19 2203  . Chlorhexidine Gluconate Cloth 2 % PADS 6 each  6 each Topical Q0600 Vashti Hey, MD   6 each at 05/03/19 0001  . dexamethasone (DECADRON) injection 6 mg  6 mg Intravenous Q24H Jennye Boroughs, MD   6 mg at 05/03/19 2204  . docusate sodium (COLACE) capsule 100 mg  100 mg Oral BID Jennye Boroughs, MD   100 mg at 05/04/19 0948  . famotidine (PEPCID) tablet 10 mg  10 mg Oral Daily Bonnell Public Tublu, MD   10 mg at 05/04/19 0948  . hydrALAZINE (APRESOLINE) tablet 25 mg  25 mg Oral TID Jennye Boroughs, MD   25 mg at 05/03/19 1737  . insulin aspart (novoLOG) injection 0-6 Units  0-6 Units Subcutaneous TID WC Vashti Hey, MD   1 Units at 05/04/19 0950  . levETIRAcetam (KEPPRA) tablet 250 mg  250 mg Oral Q T,Th,Sa-HD Jennye Boroughs, MD      . levETIRAcetam (KEPPRA) tablet 500 mg  500 mg Oral BID Jennye Boroughs, MD   500 mg at 05/04/19 0948  . mirtazapine (REMERON) tablet 7.5 mg  7.5 mg Oral Daily Jennye Boroughs, MD   7.5 mg at 05/04/19 0948  . mupirocin ointment (BACTROBAN) 2 % 1 application  1 application Nasal BID Vashti Hey, MD   1 application at 56/43/32 0955  . nitroGLYCERIN (NITROSTAT) SL tablet 0.4 mg  0.4 mg Sublingual Q5 min PRN Jennye Boroughs, MD      . ondansetron [facility]) tablet 4 mg  4 mg Oral Q6H PRN Jennye Boroughs, MD       Or  . ondansetron (ZOFRAN) injection 4 mg  4 mg Intravenous Q6H PRN Jennye Boroughs, MD      . polyethylene glycol  (MIRALAX / GLYCOLAX) packet 17 g  17 g Oral Daily PRN Jennye Boroughs, MD      . polyvinyl alcohol (LIQUIFILM TEARS) 1.4 % ophthalmic solution 1 drop  1 drop Both Eyes BID Jennye Boroughs, MD   1 drop at 05/04/19 0956  . remdesivir 100 mg in sodium chloride 0.9 % 100 mL IVPB  100 mg Intravenous Daily Jennye Boroughs, MD 200 mL/hr at 05/04/19 0947 100 mg at 05/04/19 0947  . rOPINIRole (REQUIP) tablet 0.25 mg  0.25 mg Oral QHS Jennye Boroughs, MD   0.25 mg at 05/03/19 2203  . sevelamer carbonate (RENVELA) tablet 1,600 mg  1,600 mg Oral TID WC Jennye Boroughs, MD  1,600 mg at 05/04/19 0947  . timolol (TIMOPTIC) 0.5 % ophthalmic solution 1 drop  1 drop Both Eyes BID Jennye Boroughs, MD   1 drop at 05/04/19 0956  . traMADol (ULTRAM) tablet 50 mg  50 mg Oral Q8H PRN Jennye Boroughs, MD   50 mg at 05/02/19 1735  . vitamin B-12 (CYANOCOBALAMIN) tablet 1,000 mcg  1,000 mcg Oral Daily Jennye Boroughs, MD   1,000 mcg at 05/03/19 2979     Discharge Medications: Please see discharge summary for a list of discharge medications.  Relevant Imaging Results:  Relevant Lab Results:   Additional Information SSn: [id number]  Beverly Sessions, RN

## 2019-05-04 NOTE — Progress Notes (Signed)
[facility]  ROUNDING NOTE   Subjective:   Patient to be discharged today.   Objective:  Vital signs in last 24 hours:  Temp:  [97.6 F (36.4 C)-98.3 F (36.8 C)] 97.6 F (36.4 C) (01/12 0457) Pulse Rate:  [51-59] 52 (01/12 0457) Resp:  [17-18] 18 (01/12 0457) BP: (112-148)/(51-57) 148/57 (01/12 0457) SpO2:  [90 %-100 %] 92 % (01/12 0946)  Weight change:  Filed Weights   05/01/19 1313 05/02/19 0249  Weight: 53.5 kg 55.5 kg    Intake/Output: No intake/output data recorded.   Intake/Output this shift:  No intake/output data recorded.  Physical Exam: General: NAD,   Head: Normocephalic, atraumatic. Moist oral mucosal membranes  Eyes: Anicteric, PERRL  Neck: Supple, trachea midline  Lungs:  Clear to auscultation  Heart: Regular rate and rhythm  Abdomen:  Soft, nontender,   Extremities:  bilateral BKA  Neurologic: Nonfocal, moving all four extremities  Skin: No lesions  Access: Left permcath    Basic Metabolic Panel: Recent Labs  Lab 05/01/19 1321 05/02/19 0353 05/03/19 0744 05/04/19 0257  NA 138 138 139 138  K 3.6 3.7 4.8 4.5  CL 95* 94* 95* 95*  CO2 28 25 29 28   GLUCOSE 101* 100* 266* 260*  BUN 32* 37* 38* 61*  CREATININE 5.76* 6.71* 5.38* 6.16*  CALCIUM 7.7* 7.4* 7.9* 7.8*  MG  --   --  2.1 2.1    Liver Function Tests: Recent Labs  Lab 05/01/19 1735 05/02/19 0353 05/03/19 0744 05/04/19 0257  AST 26 26 19 18   ALT 22 21 18 14   ALKPHOS 94 95 104 95  BILITOT 0.7 1.0 0.8 0.7  PROT 6.3* 6.8 7.4 6.8  ALBUMIN 2.8* 2.8* 3.0* 3.0*   No results for input(s): LIPASE, AMYLASE in the last 168 hours. No results for input(s): AMMONIA in the last 168 hours.  CBC: Recent Labs  Lab 05/01/19 1321 05/02/19 0353 05/02/19 1210 05/03/19 0744 05/04/19 0257  WBC 4.0 4.8 4.6 4.4 5.3  NEUTROABS  --  4.1  --  3.7 4.7  HGB 5.9* 9.3*  9.5* 10.1* 13.0 12.5  HCT 20.6* 29.3*  29.8* 31.5* 41.0 40.0  MCV 98.1 93.6 92.1 93.4 93.0  PLT 133* 127*  129* 145* 164    Cardiac Enzymes: No results for input(s): CKTOTAL, CKMB, CKMBINDEX, TROPONINI in the last 168 hours.  BNP: Invalid input(s): POCBNP  CBG: Recent Labs  Lab 05/03/19 0806 05/03/19 1240 05/03/19 1713 05/03/19 2030 05/04/19 0734  GLUCAP 225* 325* 276* 238* 245*    Microbiology: Results for orders placed or performed during the hospital encounter of 05/01/19  Respiratory Panel by RT PCR (Flu A&B, Covid) - Nasopharyngeal Swab     Status: Abnormal   Collection Time: 05/01/19  3:16 PM   Specimen: Nasopharyngeal Swab  Result Value Ref Range Status   SARS Coronavirus 2 by RT PCR POSITIVE (A) NEGATIVE Final    Comment: RESULT CALLED TO, READ BACK BY AND VERIFIED WITH: JENNIFER WHITLEY AT 1622 05/01/19.PMF (NOTE) SARS-CoV-2 target nucleic acids are DETECTED. SARS-CoV-2 RNA is generally detectable in upper respiratory specimens  during the acute phase of infection. Positive results are indicative of the presence of the identified virus, but do not rule out bacterial infection or co-infection with other pathogens not detected by the test. Clinical correlation with patient history and other diagnostic information is necessary to determine patient infection status. The expected result is Negative. Fact Sheet for Patients:  PinkCheek.be Fact Sheet for Healthcare Providers: GravelBags.it This  test is not yet approved or cleared by the [place] and  has been authorized for detection and/or diagnosis of SARS-CoV-2 by FDA under an Emergency Use Authorization (EUA).  This EUA will remain in effect (meaning this test can be used) f or the duration of  the COVID-19 declaration under Section 564(b)(1) of the Act, 21 U.S.C. section 360bbb-3(b)(1), unless the authorization is terminated or revoked sooner.    Influenza A by PCR NEGATIVE NEGATIVE Final   Influenza B by PCR NEGATIVE NEGATIVE Final    Comment:  (NOTE) The Xpert Xpress SARS-CoV-2/FLU/RSV assay is intended as an aid in  the diagnosis of influenza from Nasopharyngeal swab specimens and  should not be used as a sole basis for treatment. Nasal washings and  aspirates are unacceptable for Xpert Xpress SARS-CoV-2/FLU/RSV  testing. Fact Sheet for Patients: PinkCheek.be Fact Sheet for Healthcare Providers: GravelBags.it This test is not yet approved or cleared by the [place] FDA and  has been authorized for detection and/or diagnosis of SARS-CoV-2 by  FDA under an Emergency Use Authorization (EUA). This EUA will remain  in effect (meaning this test can be used) for the duration of the  Covid-19 declaration under Section 564(b)(1) of the Act, 21  U.S.C. section 360bbb-3(b)(1), unless the authorization is  terminated or revoked. Performed at [facility], [address] [place], [place] [address]   MRSA PCR Screening     Status: Abnormal   Collection Time: 05/02/19  6:26 AM   Specimen: Nasopharyngeal  Result Value Ref Range Status   MRSA by PCR POSITIVE (A) NEGATIVE Final    Comment:        The GeneXpert MRSA Assay (FDA approved for NASAL specimens only), is one component of a comprehensive MRSA colonization surveillance program. It is not intended to diagnose MRSA infection nor to guide or monitor treatment for MRSA infections. RESULT CALLED TO, READ BACK BY AND VERIFIED WITH: ANNABELLA ROBINSON AT 0840 ON 05/02/2019 [place]. Performed at [facility], [address] [place], [place] [address]     Coagulation Studies: Recent Labs    05/01/19 1325  LABPROT 17.1*  INR 1.4*    Urinalysis: No results for input(s): COLORURINE, LABSPEC, PHURINE, GLUCOSEU, HGBUR, BILIRUBINUR, KETONESUR, PROTEINUR, UROBILINOGEN, NITRITE, LEUKOCYTESUR in the last 72 hours.  Invalid input(s): APPERANCEUR    Imaging: No results found.   Medications:   .  remdesivir 100 mg in NS 100 mL 100 mg (05/04/19 0947)   . apixaban  2.5 mg Oral BID  . atorvastatin  80 mg Oral QHS  . brimonidine  1 drop Both Eyes BID  . carvedilol  25 mg Oral BID  . Chlorhexidine Gluconate Cloth  6 each Topical Q0600  . dexamethasone (DECADRON) injection  6 mg Intravenous Q24H  . docusate sodium  100 mg Oral BID  . famotidine  10 mg Oral Daily  . hydrALAZINE  25 mg Oral TID  . insulin aspart  0-6 Units Subcutaneous TID WC  . levETIRAcetam  250 mg Oral Q T,Th,Sa-HD  . levETIRAcetam  500 mg Oral BID  . mirtazapine  7.5 mg Oral Daily  . mupirocin ointment  1 application Nasal BID  . polyvinyl alcohol  1 drop Both Eyes BID  . rOPINIRole  0.25 mg Oral QHS  . sevelamer carbonate  1,600 mg Oral TID WC  . timolol  1 drop Both Eyes BID  . vitamin B-12  1,000 mcg Oral Daily     Assessment/ Plan:  Ms.  Stephanie Ellis is a 69 y.o. black female with end stage renal disease on hemodialysis, hypertension, peripheral vascular disease, bilateral below the knee amputations, coronary artery disease status post CABG, diabetes mellitus type II insulin dependent, history of endocarditis, congestive heart failure who is admitted to [facility] on 05/01/2019 for Low hemoglobin [D64.9] Cholelithiasis [K80.20] Severe anemia [D64.9] Anemia, unspecified type [D64.9]  [facility] TTS [facility] [address] permcath  1. End Stage Renal Disease on hemodialysis: missed dialysis Saturday. Back up treatment on Sunday.  - next treatment for later today as outpatient.   2. Anemia with renal failure: hemoglobin 5.9 on admission. Status post 2 units PRBC.  Appreciate GI input.    3. Hypertension:  Home regimen of carvedilol and hydralazine.  4. Secondary Hyperparathyroidism - sevelamer   LOS: 3 Deadra Diggins 1/12/202112:08 PM

## 2019-05-04 NOTE — Progress Notes (Signed)
Discharge order received. Patient mental status is at baseline. Vital signs stable . No signs of acute distress. Discharge instructions given. Patient verbalized understanding. No other issues noted at this time.

## 2019-05-04 NOTE — Discharge Summary (Signed)
Stephanie Ellis GNF:[id number] DOB: Dec 15, 1950 DOA: 05/01/2019  PCP: Kirk Ruths, MD  Admit date: 05/01/2019  Discharge date: 05/04/2019  Admitted From: SNF   disposition: SNF   Recommendations for Outpatient Follow-up:   Follow up with PCP in 1-2 weeks  Home Health: Returning to SNF Equipment/Devices: Has multiple devices Consultations: Renal, GI Discharge Condition: Improved CODE STATUS: DNR Diet Recommendation: Heart Healthy renal  Diet Order            Diet - low sodium heart healthy        Diet renal/carb modified with fluid restriction Diet-HS Snack? Nothing; Fluid restriction: 1200 mL Fluid; Room service appropriate? Yes; Fluid consistency: Thin  Diet effective now               Chief Complaint  Patient presents with  . Weakness     Brief history of present illness from the day of admission and additional interim summary     Stephanie Ellis is a 69 y.o. female with history of coronary disease status post CABG, end-stage renal disease on hemodialysis, diabetes, hypertension, peripheral vascular disease status post bilateral BKA who was sent from dialysis yesterday due to low hemoglobin.  Patient was also symptomatic with shortness of breath, chest x-ray revealed bilateral pneumonia and COVID-19 positive at skilled nursing home on 04/20/2019.  Her hemoglobin on arrival was 5.9, normal MCV, baseline hemoglobin 11.9 on 07/12/2018.  She also has chronic thrombocytopenia.  Received blood transfusion and responded appropriately, hemoglobin this morning was 10.1.  Denies abdominal pain, abdominal distention, melena, rectal bleeding, nausea, vomiting, hematemesis.  CT abdomen and pelvis revealed moderate ascites and gallbladder wall thickening.  Subsequently, underwent right upper quadrant ultrasound which  revealed cholelithiasis, nodular liver, patent portal vein with normal direction of the blood flow, ascites.  No evidence of splenomegaly.  LFTs revealed mildly low albumin, otherwise normal                                                                 Hospital Course   Patient is a 69 year old female with ESRD on HD admitted from hemodialysis for severe symptomatic anemia, status post transfusion 05/01/2019.  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  Patient was noted to be guaiac negative in the ED.  She was also noted to have incidental gallbladder thickening on CT with normal abdominal exam.  Chest x-ray revealed patchy infiltrates consistent with pneumonia and patient did have an oxygen requirement.  Patient had been diagnosed with Covid positivity on 04/19/2020 at her SNF.  Anemia r/o GIB Unclear etiology of decrease in hemoglobin to 6 on admission 05/01/2019 Abdominal pelvic CT without evidence of retroperitoneal abscess Stool guaiacs are negative Patient underwent transfusion with  2 units PRBC with excellent response to hemoglobin. Hg continue to rise, now 13 without further transfusion ? Fluid shifts GI has been consulted who has recommended endoscopic work-up as an outpatient as no evidence for acute GI bleed Eliquis has been restarted.  Pneumonia secondary to COVID-19 Patient was treated with 4 days of dexamethasone. She is discharged home on 6 days of prednisone 40 mg daily to complete 10-day course She was weaned off the oxygen while in house, no current need for oxygen therapy. No remdesivir given end-stage renal disease per renal CRP has increased from 8.4-11.0 however patient is clinically improved and she has multiple other reasons to have elevated inflammatory markers.  Abnormal CT CT abdomen and pelvis revealed moderate ascites and gallbladder wall thickening.  Subsequently, underwent right upper  quadrant ultrasound which revealed cholelithiasis, nodular liver, patent portal vein with normal direction of the blood flow, ascites.  No evidence of splenomegaly.  LFTs revealed mildly low albumin, otherwise normal, abdominal exam was benign. No clinical or laboratory evidence of acute cholecystitis Patient was seen by GI consultation who recommended outpatient follow-up for bidirectional endoscopy and to rule out esophageal varices given new diagnosis of cirrhosis.  Cirrhosis Patient was noted to have ascites on ultrasound CT and abdominal exam. GI recommended paracentesis however there was not adequate fluid to complete it. 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 Recommendation by GI is to follow-up with them for outpatient paracentesis as warranted.  DM 2  CBGs were higher than usual due to initiation of steroids. CBG will need to be followed closely over the next 6 days while she is on prednisone. SSI as warranted.  ESRD on HD HD as per nephrology service who knows her well  PVD Continue atorvastatin and carvedilol Restart Eliquis  HTN Well-controlled on carvedilol  CAD Continue carvedilol and atorvastatin  HFpEF Not decompensated, continue carvedilol and further management by HD as warranted  Seizure disorder Continue Keppra  Anxiety and depression Continue mirtazapine  Cirrhosis   Discharge diagnosis     Principal Problem:   Severe anemia Active Problems:   Coronary artery disease involving left main coronary artery   ESRD (end stage renal disease) on dialysis (HCC)   Diabetes mellitus, type 2 (HCC)   CKD (chronic kidney disease) stage 5, GFR less than 15 ml/min (HCC)   Pneumonia due to COVID-19 virus   Cirrhosis, cardiac    Discharge instructions    Discharge Instructions    Call MD for:  difficulty breathing, headache or visual disturbances   Complete by: As directed    Call MD for:  persistant nausea and vomiting   Complete by: As  directed    Diet - low sodium heart healthy   Complete by: As directed    Discharge instructions   Complete by: As directed    You are scheduled for dialysis this afternoon Take your prednisone as prescribed for 6 more days Come back to the ER if you develop fevers or shortness of breath again See your PCP in 1 week to see how you are doing from a respiratory standpoint   Increase activity slowly   Complete by: As directed       Discharge Medications   Allergies as of 05/04/2019      Reactions   Chlorthalidone Anaphylaxis, Itching, Rash   Fentanyl Rash   Midazolam Rash   Ace Inhibitors Other (See Comments)   Reaction:  Hyperkalemia, agitation    Angiotensin Receptor Blockers Other (See Comments)  Reaction:  Hyperkalemia, agitation    Norvasc [amlodipine] Itching, Rash   Phenergan [promethazine Hcl] Anxiety   "antsy, can't sit still"      Medication List    TAKE these medications   apixaban 2.5 MG Tabs tablet Commonly known as: ELIQUIS Take 1 tablet (2.5 mg total) by mouth 2 (two) times daily.   aspirin EC 81 MG tablet Take 1 tablet (81 mg total) by mouth daily.   atorvastatin 80 MG tablet Commonly known as: LIPITOR Take 80 mg by mouth at bedtime.   brimonidine 0.2 % ophthalmic solution Commonly known as: ALPHAGAN Place 1 drop into both eyes 3 (three) times daily. What changed: when to take this   carvedilol 25 MG tablet Commonly known as: COREG Take 1 tablet (25 mg total) by mouth 2 (two) times daily.   docusate sodium 100 MG capsule Commonly known as: COLACE Take 1 capsule (100 mg total) by mouth 2 (two) times daily.   famotidine 20 MG tablet Commonly known as: PEPCID Take 20 mg by mouth 2 (two) times daily.   hydrALAZINE 25 MG tablet Commonly known as: APRESOLINE Take 1 tablet (25 mg total) by mouth 3 (three) times daily for 30 days.   insulin aspart 100 UNIT/ML injection Commonly known as: novoLOG Inject into the skin 3 (three) times daily before  meals. Sliding scale 151-200=3u, 201-250=4u, 251-300=5u, 300-400=7u   insulin glargine 100 UNIT/ML injection Commonly known as: LANTUS Inject 11 Units into the skin at bedtime.   isosorbide mononitrate 30 MG 24 hr tablet Commonly known as: IMDUR Take 30 mg by mouth daily.   levETIRAcetam 500 MG tablet Commonly known as: KEPPRA Take 1 tablet (500 mg total) by mouth 2 (two) times daily.   levETIRAcetam 250 MG tablet Commonly known as: KEPPRA Three times weekly after dialysis   mirtazapine 7.5 MG tablet Commonly known as: REMERON Take 7.5 mg by mouth daily.   nitroGLYCERIN 0.4 MG SL tablet Commonly known as: NITROSTAT Place 1 tablet (0.4 mg total) under the tongue every 5 (five) minutes as needed for chest pain.   Omega-3 1000 MG Caps Take 2 capsules by mouth daily.   predniSONE 10 MG tablet Commonly known as: DELTASONE Take 4 tablets (40 mg total) by mouth daily for 6 days.   Refresh 1.4-0.6 % Soln Generic drug: Polyvinyl Alcohol-Povidone PF Place 1 drop into both eyes 2 (two) times a day.   rOPINIRole 0.25 MG tablet Commonly known as: REQUIP Take 1 tablet (0.25 mg total) by mouth at bedtime.   sevelamer carbonate 800 MG tablet Commonly known as: RENVELA Take 1,600 mg by mouth 3 (three) times daily with meals.   timolol 0.5 % ophthalmic solution Commonly known as: TIMOPTIC Place 1 drop into both eyes 3 (three) times daily. What changed: when to take this   traMADol 50 MG tablet Commonly known as: ULTRAM Take 50 mg by mouth every 8 (eight) hours as needed.   vitamin B-12 1000 MCG tablet Commonly known as: CYANOCOBALAMIN Take 1 tablet (1,000 mcg total) by mouth daily.       Follow-up Information    Vanga, Tally Due, MD. Schedule an appointment as soon as possible for a visit in 3 week(s).   Specialty: Gastroenterology Contact information: [place] [address] [phone]           Major procedures and Radiology Reports -  PLEASE review detailed and final reports thoroughly  -      CT ABDOMEN PELVIS WO CONTRAST  Addendum Date: 05/01/2019   ADDENDUM REPORT: 05/01/2019 15:43 ADDENDUM: The FINDINGS and RECOMMENDATION incorrectly state that no radiopaque gallstones are identified. Small gallstones/gallbladder sludge are seen layering dependently within the gallbladder. There remains no evidence of biliary dilatation. Electronically Signed   By: Zerita Boers M.D.   On: 05/01/2019 15:43   Result Date: 05/01/2019 CLINICAL DATA:  Status post trauma and mid back pain. Concern for retroperitoneal bleed. Patient is anemic. EXAM: CT ABDOMEN AND PELVIS WITHOUT CONTRAST TECHNIQUE: Multidetector CT imaging of the abdomen and pelvis was performed following the standard protocol without IV contrast. COMPARISON:  CT abdomen pelvis dated 10/17/2016 FINDINGS: Lower chest: Calcified pleural scarring is seen in the lung bases, unchanged. There is bibasilar atelectasis. The heart is enlarged. Hepatobiliary: No focal liver abnormality is seen. The gallbladder wall is thickened with a small amount of pericholecystic fluid. No radiopaque gallstones or biliary dilatation. Pancreas: Unremarkable. No pancreatic ductal dilatation or surrounding inflammatory changes. Spleen: Normal in size without focal abnormality. Adrenals/Urinary Tract: Adrenal glands are unremarkable. Kidneys are normal, without renal calculi, focal lesion, or hydronephrosis. A 7 mm calculus is seen in the upper right ureter. There is no hydroureter on either side. Bladder is incompletely distended. Stomach/Bowel: Stomach is within normal limits. Appendix appears normal. There is colonic diverticulosis without evidence of diverticulitis. No evidence of bowel wall thickening, distention, or inflammatory changes. Vascular/Lymphatic: Extensive aortic atherosclerosis. No enlarged abdominal or pelvic lymph nodes. Reproductive: Status post hysterectomy. No adnexal masses. Other: Moderate  ascites is noted. A fat containing ventral abdominal wall hernia is not significantly changed since 10/17/2016. no evidence of retroperitoneal bleed. Musculoskeletal: Degenerative changes are seen in the spine. A chronic fracture is seen of the inferior left pubic ramus. No acute or significant osseous findings. IMPRESSION: 1. Gallbladder wall thickening with a small amount of pericholecystic fluid is suspicious for acute cholecystitis. No radiopaque gallstones or biliary dilatation. 2. Moderate ascites. Aortic Atherosclerosis (ICD10-I70.0). Electronically Signed: By: Zerita Boers M.D. On: 05/01/2019 15:33   CT Head Wo Contrast  Result Date: 05/01/2019 CLINICAL DATA:  Head and neck trauma after a fall. EXAM: CT HEAD WITHOUT CONTRAST CT CERVICAL SPINE WITHOUT CONTRAST TECHNIQUE: Multidetector CT imaging of the head and cervical spine was performed following the standard protocol without intravenous contrast. Multiplanar CT image reconstructions of the cervical spine were also generated. COMPARISON:  CT head dated 07/09/2018 FINDINGS: CT HEAD FINDINGS Brain: No evidence of acute infarction, hemorrhage, hydrocephalus, extra-axial collection or mass lesion/mass effect. Periventricular white matter hypoattenuation likely represents chronic small vessel ischemic disease. Vascular: There are vascular calcifications in the carotid siphons. Skull: Normal. Negative for fracture or focal lesion. Sinuses/Orbits: Moderate to severe sinus disease in the bilateral maxillary, ethmoid, sphenoid, and frontal sinuses. Other: None. CT CERVICAL SPINE FINDINGS Alignment: Normal. Skull base and vertebrae: No acute fracture. No primary bone lesion or focal pathologic process. Soft tissues and spinal canal: No prevertebral fluid or swelling. No visible canal hematoma. Disc levels: Varying degrees of multilevel degenerative disc and joint disease are seen in the cervical spine. Upper chest: Negative. Other: None. IMPRESSION: 1. No acute  intracranial process. 2. No acute osseous injury in the cervical spine. 3. Moderate to severe paranasal sinus disease. Electronically Signed   By: Zerita Boers M.D.   On: 05/01/2019 15:41   CT Cervical Spine Wo Contrast  Result Date: 05/01/2019 CLINICAL DATA:  Head and neck trauma after a fall. EXAM: CT HEAD WITHOUT CONTRAST CT CERVICAL SPINE WITHOUT CONTRAST TECHNIQUE: Multidetector CT imaging of  the head and cervical spine was performed following the standard protocol without intravenous contrast. Multiplanar CT image reconstructions of the cervical spine were also generated. COMPARISON:  CT head dated 07/09/2018 FINDINGS: CT HEAD FINDINGS Brain: No evidence of acute infarction, hemorrhage, hydrocephalus, extra-axial collection or mass lesion/mass effect. Periventricular white matter hypoattenuation likely represents chronic small vessel ischemic disease. Vascular: There are vascular calcifications in the carotid siphons. Skull: Normal. Negative for fracture or focal lesion. Sinuses/Orbits: Moderate to severe sinus disease in the bilateral maxillary, ethmoid, sphenoid, and frontal sinuses. Other: None. CT CERVICAL SPINE FINDINGS Alignment: Normal. Skull base and vertebrae: No acute fracture. No primary bone lesion or focal pathologic process. Soft tissues and spinal canal: No prevertebral fluid or swelling. No visible canal hematoma. Disc levels: Varying degrees of multilevel degenerative disc and joint disease are seen in the cervical spine. Upper chest: Negative. Other: None. IMPRESSION: 1. No acute intracranial process. 2. No acute osseous injury in the cervical spine. 3. Moderate to severe paranasal sinus disease. Electronically Signed   By: Zerita Boers M.D.   On: 05/01/2019 15:41   DG Chest Portable 1 View  Result Date: 05/01/2019 CLINICAL DATA:  Decreased hemoglobin. EXAM: PORTABLE CHEST 1 VIEW COMPARISON:  Single-view of the chest 07/08/2018. FINDINGS: There is bilateral mid and lower lung zone  airspace disease. No pneumothorax or pleural fluid. Cardiomegaly. Atherosclerosis is identified. Dialysis catheter is unchanged. IMPRESSION: Bilateral mid and lower lung zone airspace disease has an appearance most worrisome for pneumonia. Cardiomegaly. Atherosclerosis. Electronically Signed   By: Inge Rise M.D.   On: 05/01/2019 14:58   US Abdomen Limited RUQ  Result Date: 05/01/2019 CLINICAL DATA:  Cholelithiasis. Patient denies abdominal pain/symptoms. EXAM: ULTRASOUND ABDOMEN LIMITED RIGHT UPPER QUADRANT COMPARISON:  None. FINDINGS: Gallbladder: The gallbladder wall demonstrates a thickened edematous wall measuring up to 7.5 mm. Mobile stone is seen in the gallbladder. There are probably tiny stones within the sludge. No Murphy's sign identified. Common bile duct: Diameter: 2.8 mm Liver: Suggested nodular contour. No focal mass. Portal vein is patent on color Doppler imaging with normal direction of blood flow towards the liver. Other: Ascites identified superiorly in the abdomen. IMPRESSION: 1. The gallbladder wall is thickened and edematous. Stones and sludge are seen in the gallbladder. No Murphy's sign noted. The findings are not specific. Acute cholecystitis not excluded on these images. If the clinical picture remains ambiguous, a HIDA scan could further evaluate. 2. The common bile duct is normal. 3. Suggested nodular contour to the liver raises the possibility of cirrhotic change. Recommend clinical correlation. No mass in the liver. Electronically Signed   By: Dorise Bullion III M.D   On: 05/01/2019 16:48    Micro Results   Recent Results (from the past 240 hour(s))  Respiratory Panel by RT PCR (Flu A&B, Covid) - Nasopharyngeal Swab     Status: Abnormal   Collection Time: 05/01/19  3:16 PM   Specimen: Nasopharyngeal Swab  Result Value Ref Range Status   SARS Coronavirus 2 by RT PCR POSITIVE (A) NEGATIVE Final    Comment: RESULT CALLED TO, READ BACK BY AND VERIFIED WITH: JENNIFER  WHITLEY AT 1622 05/01/19.PMF (NOTE) SARS-CoV-2 target nucleic acids are DETECTED. SARS-CoV-2 RNA is generally detectable in upper respiratory specimens  during the acute phase of infection. Positive results are indicative of the presence of the identified virus, but do not rule out bacterial infection or co-infection with other pathogens not detected by the test. Clinical correlation with patient history and other diagnostic  information is necessary to determine patient infection status. The expected result is Negative. Fact Sheet for Patients:  PinkCheek.be Fact Sheet for Healthcare Providers: GravelBags.it This test is not yet approved or cleared by the [place] FDA and  has been authorized for detection and/or diagnosis of SARS-CoV-2 by FDA under an Emergency Use Authorization (EUA).  This EUA will remain in effect (meaning this test can be used) f or the duration of  the COVID-19 declaration under Section 564(b)(1) of the Act, 21 U.S.C. section 360bbb-3(b)(1), unless the authorization is terminated or revoked sooner.    Influenza A by PCR NEGATIVE NEGATIVE Final   Influenza B by PCR NEGATIVE NEGATIVE Final    Comment: (NOTE) The Xpert Xpress SARS-CoV-2/FLU/RSV assay is intended as an aid in  the diagnosis of influenza from Nasopharyngeal swab specimens and  should not be used as a sole basis for treatment. Nasal washings and  aspirates are unacceptable for Xpert Xpress SARS-CoV-2/FLU/RSV  testing. Fact Sheet for Patients: PinkCheek.be Fact Sheet for Healthcare Providers: GravelBags.it This test is not yet approved or cleared by the [place] FDA and  has been authorized for detection and/or diagnosis of SARS-CoV-2 by  FDA under an Emergency Use Authorization (EUA). This EUA will remain  in effect (meaning this test can be used) for the duration of the    Covid-19 declaration under Section 564(b)(1) of the Act, 21  U.S.C. section 360bbb-3(b)(1), unless the authorization is  terminated or revoked. Performed at [facility], [address] [place], [place] [address]   MRSA PCR Screening     Status: Abnormal   Collection Time: 05/02/19  6:26 AM   Specimen: Nasopharyngeal  Result Value Ref Range Status   MRSA by PCR POSITIVE (A) NEGATIVE Final    Comment:        The GeneXpert MRSA Assay (FDA approved for NASAL specimens only), is one component of a comprehensive MRSA colonization surveillance program. It is not intended to diagnose MRSA infection nor to guide or monitor treatment for MRSA infections. RESULT CALLED TO, READ BACK BY AND VERIFIED WITH: ANNABELLA ROBINSON AT 0840 ON 05/02/2019 [place]. Performed at [facility], [address], [place], [place] [address]     Today   Subjective    Stephanie Ellis today has no new complaints.  Notes she no longer needs oxygen.  Denies shortness of breath at rest.  Denies any abdominal pain.  States she is got good energy and feels happy to return to her home.  Objective   Blood pressure (!) 148/57, pulse (!) 52, temperature 97.6 F (36.4 C), temperature source Oral, resp. rate 18, height 5' 4"  (1.626 m), weight 55.5 kg, SpO2 92 %.   Intake/Output Summary (Last 24 hours) at 05/04/2019 1104 Last data filed at 05/04/2019 0331 Gross per 24 hour  Intake 0 ml  Output 0 ml  Net 0 ml    Exam Awake Alert, Oriented x 3, No new F.N deficits, Normal affect [place].AT,PERRAL Supple Neck,No JVD, No cervical lymphadenopathy appriciated.  Symmetrical Chest wall movement, Good air movement bilaterally, CTAB RRR,No Gallops,Rubs or new Murmurs, No Parasternal Heave +ve B.Sounds, Abd firm but not hard, positive ascites.   Status post bilateral BKA.   Data Review   CBC w Diff:  Lab Results  Component Value Date   WBC 5.3 05/04/2019   HGB 12.5 05/04/2019   HCT 40.0 05/04/2019    PLT 164 05/04/2019   LYMPHOPCT 8 05/04/2019   MONOPCT 4 05/04/2019  EOSPCT 0 05/04/2019   BASOPCT 0 05/04/2019    CMP:  Lab Results  Component Value Date   NA 138 05/04/2019   K 4.5 05/04/2019   CL 95 (L) 05/04/2019   CO2 28 05/04/2019   BUN 61 (H) 05/04/2019   CREATININE 6.16 (H) 05/04/2019   PROT 6.8 05/04/2019   ALBUMIN 3.0 (L) 05/04/2019   BILITOT 0.7 05/04/2019   ALKPHOS 95 05/04/2019   AST 18 05/04/2019   ALT 14 05/04/2019  .   Total Time in preparing paper work, data evaluation and todays exam - 45 minutes  Vashti Hey M.D on 05/04/2019 at 11:04 AM  [facility]   Office  [phone]

## 2019-06-04 ENCOUNTER — Other Ambulatory Visit: Payer: Self-pay

## 2019-06-04 ENCOUNTER — Ambulatory Visit (INDEPENDENT_AMBULATORY_CARE_PROVIDER_SITE_OTHER): Payer: Medicare Other | Admitting: Gastroenterology

## 2019-06-04 ENCOUNTER — Encounter: Payer: Self-pay | Admitting: Gastroenterology

## 2019-06-04 VITALS — BP 135/66 | HR 69 | Temp 100.7°F | Ht 64.0 in

## 2019-06-04 DIAGNOSIS — R748 Abnormal levels of other serum enzymes: Secondary | ICD-10-CM | POA: Diagnosis not present

## 2019-06-04 DIAGNOSIS — K2100 Gastro-esophageal reflux disease with esophagitis, without bleeding: Secondary | ICD-10-CM

## 2019-06-04 DIAGNOSIS — K761 Chronic passive congestion of liver: Secondary | ICD-10-CM | POA: Diagnosis not present

## 2019-06-04 DIAGNOSIS — D649 Anemia, unspecified: Secondary | ICD-10-CM

## 2019-06-04 MED ORDER — PANTOPRAZOLE SODIUM 40 MG PO TBEC: 40.0000 mg | DELAYED_RELEASE_TABLET | Freq: Every day | ORAL | 11 refills | Status: AC

## 2019-06-04 NOTE — Progress Notes (Signed)
Primary Care Physician: Kirk Ruths, MD  Primary Gastroenterologist:  Dr. [name]  Chief Complaint  Patient presents with  . Anemia  . Cirrhosis    HPI: Stephanie Ellis is a 69 y.o. female here for dyspepsia with burping and excessive gas.  The patient also was found to have a low iron with a high ferritin.  The patient's ferritin was over 3000.  The patient reports that she is taking Pepcid and continues to have significant heartburn.  The patient also had an elevated C-reactive protein of 6.7.  The patient's hemoglobin has been low with her trends over the last month showing:  Component     Latest Ref Rng & Units 05/01/2019 05/02/2019 05/02/2019 05/02/2019          3:53 AM  3:53 AM 12:10 PM  Hemoglobin     12.0 - 15.0 g/dL 5.9 (L) 9.5 (L) 9.3 (L) 10.1 (L)  HCT     36.0 - 46.0 % 20.6 (L) 29.8 (L) 29.3 (L) 31.5 (L)   Component     Latest Ref Rng & Units 05/03/2019 05/04/2019           Hemoglobin     12.0 - 15.0 g/dL 13.0 12.5  HCT     36.0 - 46.0 % 41.0 40.0    The patient denies any overt blood loss.  She also denies any dysphagia or bloody stools.  Past Medical History:  Diagnosis Date  . (HFpEF) heart failure with preserved ejection fraction ([place])    a. 01/2018 Echo: EF 50-55%, no rwma, Gr1 DD, triv AI, mild MR. Midly dil LA/RV, mod reduced RV fxn. Irreg thickening of TV w/ mobile echodensity that appears to arise from valve.  . Anemia   . Anorexia   . Bacteremia due to Pseudomonas   . Carotid arterial disease ([place])    a. 01/2017 Carotid U/S: RICA <18, LICA <56.  Marland Kitchen Complication of anesthesia    a. Pt reports h/o complication on 9 different occasions - ? hypotension/arrest.  . 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 01/2015 Cath [facility]): 70% LM, p-mLAD 50-60% (Resting FFR 0.75), mRCA 80-90%, ~40 Ost OM & D1-->CABG; b. 01/2016 Staged PCI of LCX x 2 and RCA.  Marland Kitchen ESRD (end stage renal disease) on dialysis ([place])    a. ESRD secondary  to acute kidney failure s/p CABG-->PD  . Essential hypertension   . GERD (gastroesophageal reflux disease)   . Heart murmur   . Hypercholesterolemia   . Myocardial infarction ([place]) 2016  . PVD (peripheral vascular disease) ([place])    a. 01/30/2018 PV Angio: Sev Left Popliteal dzs s/p PTA w/ 84m lutonix DEB w/ mech aspiration of the L popliteal, L AT, and Left tibioperoneal trunck and peroneal artery.  . 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  [facility]: Dr. BMarland KitchenHaithcock: CABG x 3, LIMA to LAD, SVG to RCA, SVG to OM3, EVH  . Sinusitis 2019  . Type II diabetes mellitus with complication (HCC)    CAD    Current Outpatient Medications  Medication Sig Dispense Refill  . apixaban (ELIQUIS) 2.5 MG TABS tablet Take 1 tablet (2.5 mg total) by mouth 2 (two) times daily. 60 tablet 0  . aspirin EC 81 MG tablet Take 1 tablet (81 mg total) by mouth daily.    .Marland Kitchenatorvastatin (LIPITOR) 80 MG tablet Take 80 mg by mouth at bedtime.     . brimonidine (ALPHAGAN) 0.2 %  ophthalmic solution Place 1 drop into both eyes 3 (three) times daily. (Patient taking differently: Place 1 drop into both eyes 2 (two) times daily. ) 5 mL 12  . carvedilol (COREG) 25 MG tablet Take 1 tablet (25 mg total) by mouth 2 (two) times daily. 60 tablet 0  . docusate sodium (COLACE) 100 MG capsule Take 1 capsule (100 mg total) by mouth 2 (two) times daily. 10 capsule 0  . famotidine (PEPCID) 20 MG tablet Take 20 mg by mouth 2 (two) times daily.    . insulin aspart (NOVOLOG) 100 UNIT/ML injection Inject into the skin 3 (three) times daily before meals. Sliding scale 151-200=3u, 201-250=4u, 251-300=5u, 300-400=7u    . insulin glargine (LANTUS) 100 UNIT/ML injection Inject 11 Units into the skin at bedtime.     . isosorbide mononitrate (IMDUR) 30 MG 24 hr tablet Take 30 mg by mouth daily.     Marland Kitchen levETIRAcetam (KEPPRA) 250 MG tablet Three times weekly after dialysis    . levETIRAcetam (KEPPRA) 500 MG tablet Take 1 tablet (500 mg total) by mouth 2 (two)  times daily.    . mirtazapine (REMERON) 7.5 MG tablet Take 7.5 mg by mouth daily.     . nitroGLYCERIN (NITROSTAT) 0.4 MG SL tablet Place 1 tablet (0.4 mg total) under the tongue every 5 (five) minutes as needed for chest pain. 30 tablet 0  . Omega-3 1000 MG CAPS Take 2 capsules by mouth daily.     . Polyvinyl Alcohol-Povidone PF (REFRESH) 1.4-0.6 % SOLN Place 1 drop into both eyes 2 (two) times a day.     Marland Kitchen rOPINIRole (REQUIP) 0.25 MG tablet Take 1 tablet (0.25 mg total) by mouth at bedtime.    . sevelamer carbonate (RENVELA) 800 MG tablet Take 1,600 mg by mouth 3 (three) times daily with meals.     . timolol (TIMOPTIC) 0.5 % ophthalmic solution Place 1 drop into both eyes 3 (three) times daily. (Patient taking differently: Place 1 drop into both eyes 2 (two) times daily. ) 10 mL 12  . traMADol (ULTRAM) 50 MG tablet Take 50 mg by mouth every 8 (eight) hours as needed.     . vitamin B-12 (CYANOCOBALAMIN) 1000 MCG tablet Take 1 tablet (1,000 mcg total) by mouth daily. 30 tablet 0  . hydrALAZINE (APRESOLINE) 25 MG tablet Take 1 tablet (25 mg total) by mouth 3 (three) times daily for 30 days. 90 tablet 0  . pantoprazole (PROTONIX) 40 MG tablet Take 1 tablet (40 mg total) by mouth daily. 30 tablet 11   No current facility-administered medications for this visit.    Allergies as of 06/04/2019 - Review Complete 06/04/2019  Allergen Reaction Noted  . Chlorthalidone Anaphylaxis, Itching, and Rash 10/03/2015  . Fentanyl Rash 02/12/2016  . Midazolam Rash 02/12/2016  . Ace inhibitors Other (See Comments) 10/03/2015  . Angiotensin receptor blockers Other (See Comments) 10/03/2015  . Norvasc [amlodipine] Itching and Rash 10/03/2015  . Phenergan [promethazine hcl] Anxiety 10/24/2016    ROS:  General: Negative for anorexia, weight loss, fever, chills, fatigue, weakness. ENT: Negative for hoarseness, difficulty swallowing , nasal congestion. CV: Negative for chest pain, angina, palpitations, dyspnea on  exertion, peripheral edema.  Respiratory: Negative for dyspnea at rest, dyspnea on exertion, cough, sputum, wheezing.  GI: See history of present illness. GU:  Negative for dysuria, hematuria, urinary incontinence, urinary frequency, nocturnal urination.  Endo: Negative for unusual weight change.    Physical Examination:   BP 135/66   Pulse 69  Temp (!) 100.7 F (38.2 C) (Oral)   Ht 5' 4"  (1.626 m)   BMI 21.00 kg/m   General: Well-nourished, well-developed in no acute distress.  Eyes: No icterus. Conjunctivae pink. Lungs: Clear to auscultation bilaterally. Non-labored. Heart: Regular rate and rhythm, no murmurs rubs or gallops.  Abdomen: Bowel sounds are normal, nontender, nondistended, no hepatosplenomegaly or masses, no abdominal bruits or hernia , no rebound or guarding.   Extremities: No lower extremity edema. No clubbing or deformities. Neuro: Alert and oriented x 3.  Grossly intact. Skin: Warm and dry, no jaundice.   Psych: Alert and cooperative, normal mood and affect.  Labs:    Imaging Studies: No results found.  Assessment and Plan:   Stephanie Ellis is a 35 y.o. y/o female who comes in today with anemia and dyspepsia.  The patient was recently in the hospital and discharged on January 12 for symptomatic anemia.  The patient has been transfused with a correction of her hemoglobin.  The patient had an EGD in 2018 for nausea and vomiting which was unremarkable.  The patient will be switched from Pepcid to pantoprazole 40 mg a day due to her excessive burping and the report of heartburn.  The patient will also be set up for a colonoscopy due to her anemia.  She will have her blood sent off for hemochromatosis gene due to the high ferritin but this is unlikely to be positive.  The patient has been explained the plan and agrees with it.     [name], MD. Marval Regal    Note: This dictation was prepared with Dragon dictation along with smaller phrase technology. Any  transcriptional errors that result from this process are unintentional.

## 2019-06-08 ENCOUNTER — Telehealth: Payer: Self-pay | Admitting: Gastroenterology

## 2019-06-08 NOTE — Telephone Encounter (Signed)
Albina Billet a nurse from [place] left vm regarding pt having a procedure on 06/29/19 and that is a Dyalisis day she has Dyalisis Tuesday and Thursday please call Albina Billet at [phone]

## 2019-06-14 NOTE — Telephone Encounter (Signed)
Would you mind calling Stephanie Ellis and rescheduling this procedure?

## 2019-06-14 NOTE — Telephone Encounter (Signed)
Albina Billet a nurse from [place] left vm regarding pt having a procedure on 06/29/19 and that is a Dyalisis day she has Dyalisis Tuesday and Thursday please call Albina Billet at [phone]

## 2019-06-14 NOTE — Telephone Encounter (Signed)
R/S to 07/09/19 with Dr. Allen Norris at [facility].  Penny in Endo notified of date change.  New instructions will be mailed.  Albina Billet has been advised of 07/07/19 COVID test date.  Thanks,  [place], [place]

## 2019-06-25 ENCOUNTER — Other Ambulatory Visit: Payer: Self-pay

## 2019-06-25 ENCOUNTER — Other Ambulatory Visit
Admission: RE | Admit: 2019-06-25 | Discharge: 2019-06-25 | Disposition: A | Discharge status: Home / Self Care | Payer: Medicare Other | Source: Ambulatory Visit | Attending: Gastroenterology | Admitting: Gastroenterology

## 2019-06-25 DIAGNOSIS — R748 Abnormal levels of other serum enzymes: Secondary | ICD-10-CM | POA: Insufficient documentation

## 2019-06-29 LAB — HEMOCHROMATOSIS DNA-PCR(C282Y,H63D)

## 2019-06-30 ENCOUNTER — Telehealth: Payer: Self-pay

## 2019-06-30 NOTE — Telephone Encounter (Signed)
-----   Message from [name], MD sent at 06/29/2019  2:16 PM EST ----- Let the patient know that the blood test for the genetic abnormality was negative.

## 2019-06-30 NOTE — Telephone Encounter (Signed)
Contact group home and informed nurse of lab result for pt.

## 2019-07-07 ENCOUNTER — Other Ambulatory Visit: Payer: Medicare Other

## 2019-07-09 ENCOUNTER — Telehealth: Payer: Self-pay

## 2019-07-09 ENCOUNTER — Encounter
Admission: RE | Disposition: A | Discharge status: Home / Self Care | Payer: Self-pay | Source: Home / Self Care | Attending: Gastroenterology

## 2019-07-09 ENCOUNTER — Other Ambulatory Visit: Payer: Self-pay

## 2019-07-09 ENCOUNTER — Ambulatory Visit: Payer: Medicare Other | Admitting: Anesthesiology

## 2019-07-09 ENCOUNTER — Ambulatory Visit
Admission: RE | Admit: 2019-07-09 | Discharge: 2019-07-09 | Disposition: A | Discharge status: Home / Self Care | Payer: Medicare Other | Source: Home / Self Care | Attending: Gastroenterology | Admitting: Gastroenterology

## 2019-07-09 ENCOUNTER — Encounter: Payer: Self-pay | Admitting: Gastroenterology

## 2019-07-09 DIAGNOSIS — D649 Anemia, unspecified: Secondary | ICD-10-CM

## 2019-07-09 SURGERY — COLONOSCOPY WITH PROPOFOL
Anesthesia: General

## 2019-07-09 MED ORDER — PROPOFOL 500 MG/50ML IV EMUL
INTRAVENOUS | Status: AC
  Filled 2019-07-09: qty 50

## 2019-07-09 NOTE — Anesthesia Preprocedure Evaluation (Deleted)
Anesthesia Evaluation  Patient identified by MRN, date of birth, ID band Patient awake    Reviewed: Allergy & Precautions, NPO status , Patient's Chart, lab work & pertinent test results, reviewed documented beta blocker date and time   History of Anesthesia Complications (+) PROLONGED EMERGENCE and history of anesthetic complications  Airway Mallampati: II  TM Distance: >3 FB     Dental  (+) Chipped, Dental Advidsory Given   Pulmonary neg pulmonary ROS,           Cardiovascular hypertension, Pt. on medications and Pt. on home beta blockers + angina + CAD, + Past MI, + Cardiac Stents, + Peripheral Vascular Disease and +CHF  + dysrhythmias + Valvular Problems/Murmurs      Neuro/Psych  Neuromuscular disease CVA    GI/Hepatic GERD  Controlled,  Endo/Other  diabetes, Type 2  Renal/GU ESRF and DialysisRenal disease     Musculoskeletal   Abdominal   Peds negative pediatric ROS (+)  Hematology  (+) Blood dyscrasia, anemia ,   Anesthesia Other Findings Past Medical History: No date: (HFpEF) heart failure with preserved ejection fraction ([place])     Comment:  a. 01/2018 Echo: EF 50-55%, no rwma, Gr1 DD, triv AI,               mild MR. Midly dil LA/RV, mod reduced RV fxn. Irreg               thickening of TV w/ mobile echodensity that appears to               arise from valve. No date: Anemia No date: Anorexia No date: Bacteremia due to Pseudomonas No date: Carotid arterial disease ([place])     Comment:  a. 01/2017 Carotid U/S: RICA <53, LICA <97. No date: Complication of anesthesia     Comment:  a. Pt reports h/o complication on 9 different occasions               - ? hypotension/arrest. 01/2015: Coronary artery disease involving left main coronary artery     Comment:  a. 01/2015 Cath [facility]): 70% LM, p-mLAD 50-60% (Resting               FFR 0.75), mRCA 80-90%, ~40 Ost OM & D1-->CABG; b.               01/2016 Staged PCI of  LCX x 2 and RCA. No date: ESRD (end stage renal disease) on dialysis [facility])     Comment:  a. ESRD secondary to acute kidney failure s/p CABG-->PD No date: Essential hypertension No date: GERD (gastroesophageal reflux disease) No date: Heart murmur No date: Hypercholesterolemia 2016: Myocardial infarction ([place]) No date: PVD (peripheral vascular disease) ([place])     Comment:  a. 01/30/2018 PV Angio: Sev Left Popliteal dzs s/p PTA               w/ 56m lutonix DEB w/ mech aspiration of the L popliteal,              L AT, and Left tibioperoneal trunck and peroneal artery. 03/24/2015: S/P CABG x 3     Comment:  a.  [facility]: Dr. BMarland KitchenHaithcock: CABG x 3, LIMA to LAD,              SVG to RCA, [facility]: Sinusitis No date: Type II diabetes mellitus with complication ([place]     Comment:  CAD  Reproductive/Obstetrics negative OB ROS  Anesthesia Physical  Anesthesia Plan  ASA: IV  Anesthesia Plan: General   Post-op Pain Management:    Induction: Intravenous  PONV Risk Score and Plan: 3 and Propofol infusion and TIVA  Airway Management Planned: Nasal Cannula  Additional Equipment:   Intra-op Plan:   Post-operative Plan:   Informed Consent: I have reviewed the patients History and Physical, chart, labs and discussed the procedure including the risks, benefits and alternatives for the proposed anesthesia with the patient or authorized representative who has indicated his/her understanding and acceptance.     Dental Advisory Given  Plan Discussed with: CRNA  Anesthesia Plan Comments:         Anesthesia Quick Evaluation

## 2019-07-09 NOTE — Telephone Encounter (Signed)
-----   Message from Algernon Huxley, MD sent at 07/09/2019 12:40 PM EDT ----- May stop two days prior and resume the day after ----- Message ----- From: [place], CMA Sent: 07/09/2019  11:53 AM EDT To: Algernon Huxley, MD

## 2019-07-27 ENCOUNTER — Telehealth: Payer: Self-pay

## 2019-07-27 NOTE — Telephone Encounter (Signed)
Stephanie Ellis in Endo contacted patients daughter in regards to having COVID test prior to Colonoscopy.  Pts daughter said she LVM to cancel her mothers procedures.  Pt has just moved into [facility].  Thanks,  [place], [place]

## 2019-07-28 ENCOUNTER — Other Ambulatory Visit: Admission: RE | Admit: 2019-07-28 | Payer: Medicare Other | Source: Ambulatory Visit

## 2019-07-30 ENCOUNTER — Ambulatory Visit: Admission: RE | Admit: 2019-07-30 | Payer: Medicare Other | Source: Home / Self Care | Admitting: Gastroenterology

## 2019-07-30 ENCOUNTER — Telehealth: Payer: Self-pay

## 2019-07-30 ENCOUNTER — Encounter: Admission: RE | Payer: Self-pay | Source: Home / Self Care

## 2019-07-30 SURGERY — COLONOSCOPY WITH PROPOFOL
Anesthesia: General

## 2019-07-30 NOTE — Telephone Encounter (Signed)
Albina Billet from [facility] contacted the office yesterday to cancel patients colonoscopy that was scheduled for today.  Colonoscopy was already canceled per daughter's request earlier.  Thank you,  Sharyn Lull, CMA

## 2019-09-02 ENCOUNTER — Other Ambulatory Visit: Payer: Self-pay

## 2019-09-02 ENCOUNTER — Inpatient Hospital Stay
Admission: EM | Admit: 2019-09-02 | Discharge: 2019-09-06 | DRG: 291 | Disposition: A | Discharge status: Skilled Nursing Facility | Payer: Medicare Other | Source: Skilled Nursing Facility | Attending: Internal Medicine | Admitting: Internal Medicine

## 2019-09-02 ENCOUNTER — Emergency Department: Payer: Medicare Other

## 2019-09-02 ENCOUNTER — Encounter: Payer: Self-pay | Admitting: Emergency Medicine

## 2019-09-02 DIAGNOSIS — R630 Anorexia: Secondary | ICD-10-CM | POA: Diagnosis present

## 2019-09-02 DIAGNOSIS — E1151 Type 2 diabetes mellitus with diabetic peripheral angiopathy without gangrene: Secondary | ICD-10-CM | POA: Diagnosis present

## 2019-09-02 DIAGNOSIS — Z833 Family history of diabetes mellitus: Secondary | ICD-10-CM | POA: Diagnosis not present

## 2019-09-02 DIAGNOSIS — N2581 Secondary hyperparathyroidism of renal origin: Secondary | ICD-10-CM | POA: Diagnosis present

## 2019-09-02 DIAGNOSIS — I10 Essential (primary) hypertension: Secondary | ICD-10-CM

## 2019-09-02 DIAGNOSIS — I959 Hypotension, unspecified: Secondary | ICD-10-CM | POA: Diagnosis not present

## 2019-09-02 DIAGNOSIS — R079 Chest pain, unspecified: Secondary | ICD-10-CM | POA: Diagnosis not present

## 2019-09-02 DIAGNOSIS — I5022 Chronic systolic (congestive) heart failure: Secondary | ICD-10-CM | POA: Diagnosis not present

## 2019-09-02 DIAGNOSIS — Z951 Presence of aortocoronary bypass graft: Secondary | ICD-10-CM

## 2019-09-02 DIAGNOSIS — I5043 Acute on chronic combined systolic (congestive) and diastolic (congestive) heart failure: Secondary | ICD-10-CM | POA: Diagnosis present

## 2019-09-02 DIAGNOSIS — I2510 Atherosclerotic heart disease of native coronary artery without angina pectoris: Secondary | ICD-10-CM | POA: Diagnosis present

## 2019-09-02 DIAGNOSIS — G4734 Idiopathic sleep related nonobstructive alveolar hypoventilation: Secondary | ICD-10-CM | POA: Diagnosis not present

## 2019-09-02 DIAGNOSIS — Z992 Dependence on renal dialysis: Secondary | ICD-10-CM | POA: Diagnosis not present

## 2019-09-02 DIAGNOSIS — Z89511 Acquired absence of right leg below knee: Secondary | ICD-10-CM

## 2019-09-02 DIAGNOSIS — K219 Gastro-esophageal reflux disease without esophagitis: Secondary | ICD-10-CM | POA: Diagnosis present

## 2019-09-02 DIAGNOSIS — J9601 Acute respiratory failure with hypoxia: Secondary | ICD-10-CM | POA: Diagnosis present

## 2019-09-02 DIAGNOSIS — R1900 Intra-abdominal and pelvic swelling, mass and lump, unspecified site: Secondary | ICD-10-CM

## 2019-09-02 DIAGNOSIS — Z89512 Acquired absence of left leg below knee: Secondary | ICD-10-CM

## 2019-09-02 DIAGNOSIS — R0602 Shortness of breath: Secondary | ICD-10-CM

## 2019-09-02 DIAGNOSIS — Z79899 Other long term (current) drug therapy: Secondary | ICD-10-CM

## 2019-09-02 DIAGNOSIS — I252 Old myocardial infarction: Secondary | ICD-10-CM

## 2019-09-02 DIAGNOSIS — I739 Peripheral vascular disease, unspecified: Secondary | ICD-10-CM | POA: Diagnosis present

## 2019-09-02 DIAGNOSIS — Z79891 Long term (current) use of opiate analgesic: Secondary | ICD-10-CM

## 2019-09-02 DIAGNOSIS — E1122 Type 2 diabetes mellitus with diabetic chronic kidney disease: Secondary | ICD-10-CM | POA: Diagnosis present

## 2019-09-02 DIAGNOSIS — E1169 Type 2 diabetes mellitus with other specified complication: Secondary | ICD-10-CM | POA: Diagnosis not present

## 2019-09-02 DIAGNOSIS — E785 Hyperlipidemia, unspecified: Secondary | ICD-10-CM | POA: Diagnosis present

## 2019-09-02 DIAGNOSIS — Z7982 Long term (current) use of aspirin: Secondary | ICD-10-CM

## 2019-09-02 DIAGNOSIS — E7800 Pure hypercholesterolemia, unspecified: Secondary | ICD-10-CM | POA: Diagnosis present

## 2019-09-02 DIAGNOSIS — I132 Hypertensive heart and chronic kidney disease with heart failure and with stage 5 chronic kidney disease, or end stage renal disease: Secondary | ICD-10-CM | POA: Diagnosis present

## 2019-09-02 DIAGNOSIS — Z9841 Cataract extraction status, right eye: Secondary | ICD-10-CM

## 2019-09-02 DIAGNOSIS — J9621 Acute and chronic respiratory failure with hypoxia: Secondary | ICD-10-CM | POA: Diagnosis present

## 2019-09-02 DIAGNOSIS — I5023 Acute on chronic systolic (congestive) heart failure: Secondary | ICD-10-CM | POA: Diagnosis not present

## 2019-09-02 DIAGNOSIS — Z961 Presence of intraocular lens: Secondary | ICD-10-CM | POA: Diagnosis present

## 2019-09-02 DIAGNOSIS — Z20822 Contact with and (suspected) exposure to covid-19: Secondary | ICD-10-CM | POA: Diagnosis present

## 2019-09-02 DIAGNOSIS — N186 End stage renal disease: Secondary | ICD-10-CM | POA: Diagnosis present

## 2019-09-02 DIAGNOSIS — I160 Hypertensive urgency: Secondary | ICD-10-CM | POA: Diagnosis present

## 2019-09-02 DIAGNOSIS — Z7901 Long term (current) use of anticoagulants: Secondary | ICD-10-CM

## 2019-09-02 DIAGNOSIS — Z888 Allergy status to other drugs, medicaments and biological substances status: Secondary | ICD-10-CM

## 2019-09-02 DIAGNOSIS — R569 Unspecified convulsions: Secondary | ICD-10-CM | POA: Diagnosis present

## 2019-09-02 DIAGNOSIS — Z9842 Cataract extraction status, left eye: Secondary | ICD-10-CM

## 2019-09-02 DIAGNOSIS — Z794 Long term (current) use of insulin: Secondary | ICD-10-CM

## 2019-09-02 LAB — COMPREHENSIVE METABOLIC PANEL
ALT: 32 U/L (ref 0–44)
AST: 19 U/L (ref 15–41)
Albumin: 3.5 g/dL (ref 3.5–5.0)
Alkaline Phosphatase: 179 U/L — ABNORMAL HIGH (ref 38–126)
Anion gap: 12 (ref 5–15)
BUN: 31 mg/dL — ABNORMAL HIGH (ref 8–23)
CO2: 31 mmol/L (ref 22–32)
Calcium: 8.3 mg/dL — ABNORMAL LOW (ref 8.9–10.3)
Chloride: 93 mmol/L — ABNORMAL LOW (ref 98–111)
Creatinine, Ser: 5.35 mg/dL — ABNORMAL HIGH (ref 0.44–1.00)
GFR calc Af Amer: 9 mL/min — ABNORMAL LOW (ref >60)
GFR calc non Af Amer: 8 mL/min — ABNORMAL LOW (ref >60)
Glucose, Bld: 247 mg/dL — ABNORMAL HIGH (ref 70–99)
Potassium: 4.8 mmol/L (ref 3.5–5.1)
Sodium: 136 mmol/L (ref 135–145)
Total Bilirubin: 0.5 mg/dL (ref 0.3–1.2)
Total Protein: 7.1 g/dL (ref 6.5–8.1)

## 2019-09-02 LAB — CBC WITH DIFFERENTIAL/PLATELET
Abs Immature Granulocytes: 0.03 10*3/uL (ref 0.00–0.07)
Basophils Absolute: 0 10*3/uL (ref 0.0–0.1)
Basophils Relative: 0 %
Eosinophils Absolute: 0 10*3/uL (ref 0.0–0.5)
Eosinophils Relative: 1 %
HCT: 32.3 % — ABNORMAL LOW (ref 36.0–46.0)
Hemoglobin: 9.9 g/dL — ABNORMAL LOW (ref 12.0–15.0)
Immature Granulocytes: 1 %
Lymphocytes Relative: 20 %
Lymphs Abs: 1.1 10*3/uL (ref 0.7–4.0)
MCH: 30.3 pg (ref 26.0–34.0)
MCHC: 30.7 g/dL (ref 30.0–36.0)
MCV: 98.8 fL (ref 80.0–100.0)
Monocytes Absolute: 0.4 10*3/uL (ref 0.1–1.0)
Monocytes Relative: 8 %
Neutro Abs: 3.8 10*3/uL (ref 1.7–7.7)
Neutrophils Relative %: 70 %
Platelets: 107 10*3/uL — ABNORMAL LOW (ref 150–400)
RBC: 3.27 MIL/uL — ABNORMAL LOW (ref 3.87–5.11)
RDW: 17.2 % — ABNORMAL HIGH (ref 11.5–15.5)
WBC: 5.4 10*3/uL (ref 4.0–10.5)
nRBC: 0 % (ref 0.0–0.2)

## 2019-09-02 LAB — LACTIC ACID, PLASMA: Lactic Acid, Venous: 1.5 mmol/L (ref 0.5–1.9)

## 2019-09-02 LAB — GLUCOSE, CAPILLARY: Glucose-Capillary: 234 mg/dL — ABNORMAL HIGH (ref 70–99)

## 2019-09-02 LAB — TROPONIN I (HIGH SENSITIVITY): Troponin I (High Sensitivity): 57 ng/L — ABNORMAL HIGH (ref <18)

## 2019-09-02 LAB — BRAIN NATRIURETIC PEPTIDE: B Natriuretic Peptide: >4500 pg/mL — ABNORMAL HIGH (ref 0.0–100.0)

## 2019-09-02 LAB — SARS CORONAVIRUS 2 BY RT PCR (HOSPITAL ORDER, PERFORMED IN ~~LOC~~ HOSPITAL LAB): SARS Coronavirus 2: NEGATIVE

## 2019-09-02 MED ORDER — OMEGA-3-ACID ETHYL ESTERS 1 G PO CAPS
2.0000 | ORAL_CAPSULE | Freq: Every day | ORAL | Status: DC
  Administered 2019-09-03 – 2019-09-05 (×3): 2 g via ORAL
  Filled 2019-09-02 (×3): qty 2

## 2019-09-02 MED ORDER — ACETAMINOPHEN 325 MG PO TABS
650.0000 mg | ORAL_TABLET | Freq: Once | ORAL | Status: AC
  Administered 2019-09-02: 650 mg via ORAL
  Filled 2019-09-02: qty 2

## 2019-09-02 MED ORDER — APIXABAN 2.5 MG PO TABS
2.5000 mg | ORAL_TABLET | Freq: Two times a day (BID) | ORAL | Status: DC
  Administered 2019-09-02 – 2019-09-05 (×7): 2.5 mg via ORAL
  Filled 2019-09-02 (×8): qty 1

## 2019-09-02 MED ORDER — LEVETIRACETAM 250 MG PO TABS: 250.0000 mg | ORAL_TABLET | ORAL | Status: DC

## 2019-09-02 MED ORDER — PANTOPRAZOLE SODIUM 40 MG PO TBEC
40.0000 mg | DELAYED_RELEASE_TABLET | Freq: Every day | ORAL | Status: DC
  Administered 2019-09-03 – 2019-09-05 (×3): 40 mg via ORAL
  Filled 2019-09-02 (×3): qty 1

## 2019-09-02 MED ORDER — SODIUM CHLORIDE 0.9% FLUSH: 3.0000 mL | Freq: Once | INTRAVENOUS | Status: DC

## 2019-09-02 MED ORDER — ATORVASTATIN CALCIUM 80 MG PO TABS
80.0000 mg | ORAL_TABLET | Freq: Every day | ORAL | Status: DC
  Administered 2019-09-02 – 2019-09-05 (×4): 80 mg via ORAL
  Filled 2019-09-02: qty 4
  Filled 2019-09-02 (×3): qty 1

## 2019-09-02 MED ORDER — INSULIN ASPART 100 UNIT/ML ~~LOC~~ SOLN
0.0000 [IU] | Freq: Three times a day (TID) | SUBCUTANEOUS | Status: DC
  Filled 2019-09-02 (×2): qty 1

## 2019-09-02 MED ORDER — ONDANSETRON HCL 4 MG PO TABS: 4.0000 mg | ORAL_TABLET | Freq: Four times a day (QID) | ORAL | Status: DC | PRN

## 2019-09-02 MED ORDER — ROPINIROLE HCL 0.25 MG PO TABS
0.2500 mg | ORAL_TABLET | Freq: Every day | ORAL | Status: DC
  Administered 2019-09-03 – 2019-09-05 (×3): 0.25 mg via ORAL
  Filled 2019-09-02 (×4): qty 1

## 2019-09-02 MED ORDER — TRAMADOL HCL 50 MG PO TABS
50.0000 mg | ORAL_TABLET | Freq: Three times a day (TID) | ORAL | Status: DC | PRN
  Administered 2019-09-03 – 2019-09-04 (×2): 50 mg via ORAL
  Filled 2019-09-02 (×3): qty 1

## 2019-09-02 MED ORDER — LEVETIRACETAM 500 MG PO TABS: 500.0000 mg | ORAL_TABLET | Freq: Two times a day (BID) | ORAL | Status: DC

## 2019-09-02 MED ORDER — ONDANSETRON HCL 4 MG/2ML IJ SOLN: 4.0000 mg | Freq: Four times a day (QID) | INTRAMUSCULAR | Status: DC | PRN

## 2019-09-02 MED ORDER — CLONIDINE HCL 0.1 MG PO TABS
0.1000 mg | ORAL_TABLET | Freq: Every day | ORAL | Status: DC | PRN
  Administered 2019-09-03 – 2019-09-06 (×3): 0.1 mg via ORAL
  Filled 2019-09-02 (×3): qty 1

## 2019-09-02 MED ORDER — FLUTICASONE PROPIONATE 50 MCG/ACT NA SUSP
2.0000 | Freq: Every day | NASAL | Status: DC
  Administered 2019-09-04 – 2019-09-05 (×2): 2 via NASAL
  Filled 2019-09-02: qty 16

## 2019-09-02 MED ORDER — SEVELAMER CARBONATE 800 MG PO TABS
800.0000 mg | ORAL_TABLET | Freq: Three times a day (TID) | ORAL | Status: DC
  Administered 2019-09-03 – 2019-09-06 (×8): 800 mg via ORAL
  Filled 2019-09-02 (×8): qty 1

## 2019-09-02 MED ORDER — HYDRALAZINE HCL 20 MG/ML IJ SOLN
5.0000 mg | Freq: Once | INTRAMUSCULAR | Status: AC
  Administered 2019-09-02: 5 mg via INTRAVENOUS
  Filled 2019-09-02: qty 1

## 2019-09-02 MED ORDER — TIMOLOL MALEATE 0.5 % OP SOLN
1.0000 [drp] | Freq: Three times a day (TID) | OPHTHALMIC | Status: DC
  Administered 2019-09-03 – 2019-09-05 (×8): 1 [drp] via OPHTHALMIC
  Filled 2019-09-02: qty 5

## 2019-09-02 MED ORDER — MECLIZINE HCL 25 MG PO TABS: 25.0000 mg | ORAL_TABLET | ORAL | Status: DC | PRN

## 2019-09-02 MED ORDER — ASPIRIN EC 81 MG PO TBEC
81.0000 mg | DELAYED_RELEASE_TABLET | Freq: Every day | ORAL | Status: DC
  Administered 2019-09-03 – 2019-09-05 (×3): 81 mg via ORAL
  Filled 2019-09-02 (×3): qty 1

## 2019-09-02 MED ORDER — MIRTAZAPINE 15 MG PO TABS
7.5000 mg | ORAL_TABLET | Freq: Every day | ORAL | Status: DC
  Administered 2019-09-03 – 2019-09-05 (×3): 7.5 mg via ORAL
  Filled 2019-09-02 (×3): qty 1

## 2019-09-02 MED ORDER — ISOSORBIDE MONONITRATE ER 30 MG PO TB24
30.0000 mg | ORAL_TABLET | Freq: Every day | ORAL | Status: DC
  Administered 2019-09-03 – 2019-09-06 (×4): 30 mg via ORAL
  Filled 2019-09-02 (×4): qty 1

## 2019-09-02 MED ORDER — POLYVINYL ALCOHOL-POVIDONE PF 1.4-0.6 % OP SOLN: 1.0000 [drp] | Freq: Two times a day (BID) | OPHTHALMIC | Status: DC

## 2019-09-02 MED ORDER — CARVEDILOL 25 MG PO TABS
25.0000 mg | ORAL_TABLET | Freq: Two times a day (BID) | ORAL | Status: DC
  Administered 2019-09-02 – 2019-09-06 (×7): 25 mg via ORAL
  Filled 2019-09-02 (×7): qty 1

## 2019-09-02 MED ORDER — BRIMONIDINE TARTRATE 0.2 % OP SOLN
1.0000 [drp] | Freq: Three times a day (TID) | OPHTHALMIC | Status: DC
  Administered 2019-09-03 – 2019-09-05 (×8): 1 [drp] via OPHTHALMIC
  Filled 2019-09-02: qty 5

## 2019-09-02 MED ORDER — VITAMIN B-12 1000 MCG PO TABS
1000.0000 ug | ORAL_TABLET | Freq: Every day | ORAL | Status: DC
  Administered 2019-09-03 – 2019-09-05 (×3): 1000 ug via ORAL
  Filled 2019-09-02 (×3): qty 1

## 2019-09-02 MED ORDER — MECLIZINE HCL 25 MG PO TABS
25.0000 mg | ORAL_TABLET | Freq: Four times a day (QID) | ORAL | Status: DC | PRN
  Filled 2019-09-02: qty 1

## 2019-09-02 NOTE — ED Triage Notes (Signed)
Arrives [facility].  Patient states she woke up not feeling well.  Lives at the [facility], and per reports RA saturation was 78-84%.  Patient placed on 2l, sats improved.  CBG:  327.  VS wnl.  Patient is AAOx3.  Skin warm and dry. NAD

## 2019-09-02 NOTE — ED Notes (Signed)
Report received from [place], [place] for continuation of care.  PT resting in POC, no acute s/s of distress noted.  GCS 15, airway patent.  rr even/unlabored, abd soft/flat/nontender, skin warm and dry. BP elevated.  No other complaints noted. Updated on plan of care/verbalized understanding.  Will coantinue to monitor/reassess.

## 2019-09-02 NOTE — H&P (Addendum)
History and Physical    Stephanie Ellis BOF:[id number] DOB: 10-22-1950 DOA: 09/02/2019  PCP: Kirk Ruths, MD   Patient coming from: SNF I have personally briefly reviewed patient's old medical records in [place]  Chief Complaint: Shortness of breath  HPI: Stephanie Ellis is a 69 y.o. female with medical history significant for end-stage renal disease on hemodialysis management (dialysis days Monday Wednesday and Friday) history of coronary artery disease status post CABG, peripheral arterial disease and history of CHF.  Patient was brought into the emergency room from the nursing home for evaluation of chest tightness, shortness of breath and elevated blood pressure.  Per EMS her pulse oximetry was in the 70s on room air and she is currently on 2 L of oxygen with improvement in her pulse oximetry to 90's .  Patient went to dialysis yesterday and completed 4 hours of treatment.  Her chest tightness is associated with nausea and vomiting but she denies having palpitations or diaphoresis. She denies having abdominal pain or any changes in her bowel habits, she denies having any fever or chills.  She denies having any headaches or mental status changes.  She denies having any cough Chest x-ray shows small pleural effusions bilaterally with ill-defined airspace opacity in the periphery of each lower lobe, concerning for foci of pneumonia. Areas of mild atelectatic change also present. Twelve-lead EKG shows sinus rhythm with LVH.  ED Course: 69 year old female who presents from the skilled nursing facility for evaluation of chest tightness and shortness of breath.  Blood pressure was significantly elevated upon arrival 199/68.  Patient required oxygen supplementation for room air pulse oximetry in the 70s.  She will be referred to observation status for further evaluation  Review of Systems: As per HPI otherwise 10 point review of systems negative.    Past Medical History:  Diagnosis  Date  . (HFpEF) heart failure with preserved ejection fraction ([place])    a. 01/2018 Echo: EF 50-55%, no rwma, Gr1 DD, triv AI, mild MR. Midly dil LA/RV, mod reduced RV fxn. Irreg thickening of TV w/ mobile echodensity that appears to arise from valve.  . Anemia   . Anorexia   . Bacteremia due to Pseudomonas   . Carotid arterial disease ([place])    a. 01/2017 Carotid U/S: RICA <77, LICA <82.  Marland Kitchen Complication of anesthesia    a. Pt reports h/o complication on 9 different occasions - ? hypotension/arrest.  . 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 01/2015 Cath [facility]): 70% LM, p-mLAD 50-60% (Resting FFR 0.75), mRCA 80-90%, ~40 Ost OM & D1-->CABG; b. 01/2016 Staged PCI of LCX x 2 and RCA.  Marland Kitchen ESRD (end stage renal disease) on dialysis ([place])    a. ESRD secondary to acute kidney failure s/p CABG-->PD  . Essential hypertension   . GERD (gastroesophageal reflux disease)   . Heart murmur   . Hypercholesterolemia   . Myocardial infarction ([place]) 2016  . PVD (peripheral vascular disease) ([place])    a. 01/30/2018 PV Angio: Sev Left Popliteal dzs s/p PTA w/ 34m lutonix DEB w/ mech aspiration of the L popliteal, L AT, and Left tibioperoneal trunck and peroneal artery.  . 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  [facility]: Dr. BMarland KitchenHaithcock: CABG x 3, LIMA to LAD, SVG to RCA, SVG to OM3, EVH  . Sinusitis 2019  . Type II diabetes mellitus with complication (HCC)    CAD    Past Surgical History:  Procedure  Laterality Date  . ABDOMINAL HYSTERECTOMY    . AMPUTATION Right 02/25/2018   Procedure: AMPUTATION BELOW KNEE;  Surgeon: Katha Cabal, MD;  Location: [facility];  Service: Vascular;  Laterality: Right;  . AMPUTATION Left 03/11/2018   Procedure: AMPUTATION BELOW KNEE;  Surgeon: Algernon Huxley, MD;  Location: [facility];  Service: Vascular;  Laterality: Left;  . AMPUTATION Bilateral 04/13/2018   Procedure: REVISION OF BILATERAL AMPUTATIONS BELOW KNEE WITH WOUND VAC APPLICATIONS;   Surgeon: Evaristo Bury, MD;  Location: [facility];  Service: Vascular;  Laterality: Bilateral;  . AMPUTATION TOE Left 02/06/2018   Procedure: AMPUTATION GREAT TOE;  Surgeon: Samara Deist, DPM;  Location: [facility];  Service: Podiatry;  Laterality: Left;  . APPLICATION OF WOUND VAC Left 04/13/2018   Procedure: APPLICATION OF WOUND VAC;  Surgeon: Evaristo Bury, MD;  Location: [facility];  Service: Vascular;  Laterality: Left;  . ARTERIOVENOUS GRAFT PLACEMENT  05/2016  . AV FISTULA PLACEMENT Left 05/30/2016   Procedure: ARTERIOVENOUS graft;  Surgeon: Algernon Huxley, MD;  Location: [facility];  Service: Vascular;  Laterality: Left;  . CAPD INSERTION N/A 10/30/2017   Procedure: LAPAROSCOPIC INSERTION CONTINUOUS AMBULATORY PERITONEAL DIALYSIS  (CAPD) CATHETER;  Surgeon: Algernon Huxley, MD;  Location: [facility];  Service: Vascular;  Laterality: N/A;  . CARDIAC CATHETERIZATION  01/2015   [facility]: Ost LM 70%, p-m LAD 50-60% (Rest FFR + @ 0.75), mRCA 80-90%, ostD1 40%, pOM1 40%  . CATARACT EXTRACTION W/PHACO Left 01/18/2016   Procedure: CATARACT EXTRACTION PHACO AND INTRAOCULAR LENS PLACEMENT (IOC);  Surgeon: Eulogio Bear, MD;  Location: [facility];  Service: Ophthalmology;  Laterality: Left;  [place] 1.05 AP% 15.5 CDE 10.16 Fluid Pack Lot # [id number]  . CATARACT EXTRACTION W/PHACO Right 08/01/2016   Procedure: CATARACT EXTRACTION PHACO AND INTRAOCULAR LENS PLACEMENT (IOC);  Surgeon: Eulogio Bear, MD;  Location: [facility];  Service: Ophthalmology;  Laterality: Right;  [place] 01:00.6 AP% 11.4 CDE 6.93  LOT # [id number]  . COLONOSCOPY    . CORONARY ANGIOPLASTY     SENTS 02/12/16  . CORONARY ARTERY BYPASS GRAFT  03/28/15    [facility]: Dr. Waldemar Dickens: LIMA to LAD, SVG to RCA, SVG to OM3, EVH  . DIALYSIS/PERMA CATHETER INSERTION N/A 02/11/2018   Procedure: DIALYSIS/PERMA CATHETER INSERTION;  Surgeon: Algernon Huxley, MD;  Location: [place] [facility];  Service: Cardiovascular;  Laterality: N/A;  . DIALYSIS/PERMA CATHETER REMOVAL  Right 02/04/2018   Procedure: DIALYSIS/PERMA CATHETER REMOVAL;  Surgeon: Katha Cabal, MD;  Location: [place] [facility];  Service: Cardiovascular;  Laterality: Right;  . ESOPHAGOGASTRODUODENOSCOPY (EGD) WITH PROPOFOL N/A 10/24/2016   Procedure: ESOPHAGOGASTRODUODENOSCOPY (EGD) WITH PROPOFOL;  Surgeon: [name], MD;  Location: [facility];  Service: Endoscopy;  Laterality: N/A;  . EYE SURGERY Bilateral    cataract surgery  . EYE SURGERY     drains for glaucoma  . INSERTION EXPRESS TUBE SHUNT Right 08/01/2016   Procedure: INSERTION EXPRESS TUBE SHUNT;  Surgeon: Eulogio Bear, MD;  Location: [facility];  Service: Ophthalmology;  Laterality: Right;  . INSERTION OF AHMED VALVE Left 08/15/2016   Procedure: INSERTION OF AHMED VALVE;  Surgeon: Eulogio Bear, MD;  Location: [facility];  Service: Ophthalmology;  Laterality: Left;  . INSERTION OF DIALYSIS CATHETER    . LOWER EXTREMITY ANGIOGRAPHY Right 02/19/2018   Procedure: Lower Extremity Angiography;  Surgeon: Algernon Huxley, MD;  Location: [place] [facility];  Service: Cardiovascular;  Laterality: Right;  . LOWER EXTREMITY ANGIOGRAPHY  Left 01/30/2018   Procedure: Lower Extremity Angiography;  Surgeon: Katha Cabal, MD;  Location: [place] [facility];  Service: Cardiovascular;  Laterality: Left;  . REMOVAL OF A DIALYSIS CATHETER N/A 02/25/2018   Procedure: REMOVAL OF A DIALYSIS CATHETER;  Surgeon: Katha Cabal, MD;  Location: [facility];  Service: Vascular;  Laterality: N/A;  . TEMPORARY DIALYSIS CATHETER N/A 02/06/2018   Procedure: TEMPORARY DIALYSIS CATHETER;  Surgeon: Katha Cabal, MD;  Location: [place] [facility];  Service: Cardiovascular;  Laterality: N/A;  . TRANSTHORACIC ECHOCARDIOGRAM  January 2017    EF 60-65%. GR 2 DD. Mild degenerative mitral valve disease but no prolapse or regurgitation. Mild left atrial dilation. Mild to moderate LVH. Pericardial effusion gone  . UPPER EXTREMITY ANGIOGRAPHY Left  06/27/2016   Procedure: Upper Extremity Angiography;  Surgeon: Algernon Huxley, MD;  Location: [place] [facility];  Service: Cardiovascular;  Laterality: Left;  . UPPER EXTREMITY ANGIOGRAPHY Left 08/22/2016   Procedure: Upper Extremity Angiography;  Surgeon: Algernon Huxley, MD;  Location: [place] [facility];  Service: Cardiovascular;  Laterality: Left;  Marland Kitchen VASCULAR SURGERY    . WOUND DEBRIDEMENT Left 05/08/2018   Procedure: DEBRIDEMENT OF LEFT LEG BKA;  Surgeon: Algernon Huxley, MD;  Location: [facility];  Service: General;  Laterality: Left;     reports that she has never smoked. She has never used smokeless tobacco. She reports that she does not drink alcohol or use drugs.  Allergies  Allergen Reactions  . Chlorthalidone Anaphylaxis, Itching and Rash  . Fentanyl Rash  . Midazolam Rash  . Ace Inhibitors Other (See Comments)    Reaction:  Hyperkalemia, agitation   . Angiotensin Receptor Blockers Other (See Comments)    Reaction:  Hyperkalemia, agitation   . Norvasc [Amlodipine] Itching and Rash  . Phenergan [Promethazine Hcl] Anxiety    "antsy, can't sit still"    Family History  Problem Relation Age of Onset  . Diabetes Mellitus II Mother   . Heart failure Mother   . Pancreatic cancer Father      Prior to Admission medications   Medication Sig Start Date End Date Taking? Authorizing Provider  apixaban (ELIQUIS) 2.5 MG TABS tablet Take 1 tablet (2.5 mg total) by mouth 2 (two) times daily. 02/15/18   Vaughan Basta, MD  aspirin EC 81 MG tablet Take 1 tablet (81 mg total) by mouth daily. 10/19/16   Dustin Flock, MD  atorvastatin (LIPITOR) 80 MG tablet Take 80 mg by mouth at bedtime.  05/05/17   [provider]  brimonidine (ALPHAGAN) 0.2 % ophthalmic solution Place 1 drop into both eyes 3 (three) times daily. Patient taking differently: Place 1 drop into both eyes 2 (two) times daily.  03/18/18   Bettey Costa, MD  carvedilol (COREG) 25 MG tablet Take 1 tablet (25 mg total) by  mouth 2 (two) times daily. 07/13/18   Stark Jock Jude, MD  docusate sodium (COLACE) 100 MG capsule Take 1 capsule (100 mg total) by mouth 2 (two) times daily. 02/15/18   Vaughan Basta, MD  famotidine (PEPCID) 20 MG tablet Take 20 mg by mouth 2 (two) times daily.    [provider]  hydrALAZINE (APRESOLINE) 25 MG tablet Take 1 tablet (25 mg total) by mouth 3 (three) times daily for 30 days. 07/13/18 12/18/18  Stark Jock Jude, MD  insulin aspart (NOVOLOG) 100 UNIT/ML injection Inject into the skin 3 (three) times daily before meals. Sliding scale 151-200=3u, 201-250=4u, 251-300=5u, 300-400=7u    [provider]  insulin glargine (LANTUS) 100 UNIT/ML injection Inject 11 Units into the skin at bedtime.     [provider]  isosorbide mononitrate (IMDUR) 30 MG 24 hr tablet Take 30 mg by mouth daily.     [provider]  levETIRAcetam (KEPPRA) 250 MG tablet Three times weekly after dialysis 05/12/18   Loletha Grayer, MD  levETIRAcetam (KEPPRA) 500 MG tablet Take 1 tablet (500 mg total) by mouth 2 (two) times daily. 03/23/18   Gouru, Illene Silver, MD  mirtazapine (REMERON) 7.5 MG tablet Take 7.5 mg by mouth daily.  12/18/17   [provider]  nitroGLYCERIN (NITROSTAT) 0.4 MG SL tablet Place 1 tablet (0.4 mg total) under the tongue every 5 (five) minutes as needed for chest pain. 09/05/16   Loletha Grayer, MD  Omega-3 1000 MG CAPS Take 2 capsules by mouth daily.     [provider]  pantoprazole (PROTONIX) 40 MG tablet Take 1 tablet (40 mg total) by mouth daily. 06/04/19   [name], MD  Polyvinyl Alcohol-Povidone PF (REFRESH) 1.4-0.6 % SOLN Place 1 drop into both eyes 2 (two) times a day.     [provider]  rOPINIRole (REQUIP) 0.25 MG tablet Take 1 tablet (0.25 mg total) by mouth at bedtime. 05/12/18   Loletha Grayer, MD  sevelamer carbonate (RENVELA) 800 MG tablet Take 1,600 mg by mouth 3 (three) times daily with meals.     [provider]    timolol (TIMOPTIC) 0.5 % ophthalmic solution Place 1 drop into both eyes 3 (three) times daily. Patient taking differently: Place 1 drop into both eyes 2 (two) times daily.  03/18/18   Bettey Costa, MD  traMADol (ULTRAM) 50 MG tablet Take 50 mg by mouth every 8 (eight) hours as needed.  08/27/18   [provider]  vitamin B-12 (CYANOCOBALAMIN) 1000 MCG tablet Take 1 tablet (1,000 mcg total) by mouth daily. 02/15/18   Vaughan Basta, MD    Physical Exam: Vitals:   09/02/19 1530 09/02/19 1542 09/02/19 1600 09/02/19 1630  BP: (!) 216/84  (!) 212/83 (!) 183/66  Pulse: 60  61 (!) 53  Resp: 18  18 19   Temp:      TempSrc:      SpO2: 98% 100% 100% 98%  Weight:      Height:         Vitals:   09/02/19 1530 09/02/19 1542 09/02/19 1600 09/02/19 1630  BP: (!) 216/84  (!) 212/83 (!) 183/66  Pulse: 60  61 (!) 53  Resp: 18  18 19   Temp:      TempSrc:      SpO2: 98% 100% 100% 98%  Weight:      Height:        Constitutional: NAD, alert and oriented x 3.  Appears comfortable and in no distress.  On 3 L of oxygen via nasal cannula Eyes: PERRL, lids and conjunctivae normal, pale conjunctiva ENMT: Mucous membranes are moist.  Neck: normal, supple, no masses, no thyromegaly Respiratory: Decreased air entry at the bases, no wheezing, no crackles. Normal respiratory effort. No accessory muscle use.  Cardiovascular: Regular rate and rhythm, no murmurs / rubs / gallops. No extremity edema. 2+ pedal pulses. No carotid bruits.  Abdomen: no tenderness, no masses palpated. No hepatosplenomegaly. Bowel sounds positive.  Musculoskeletal: no clubbing / cyanosis.  Bilateral BKA Skin: no rashes, lesions, ulcers.  Neurologic: No gross focal neurologic deficit. Psychiatric: Normal mood and affect.   Labs on Admission: I have  personally reviewed following labs and imaging studies  CBC: Recent Labs  Lab 09/02/19 1046  WBC 5.4  NEUTROABS 3.8  HGB 9.9*  HCT 32.3*  MCV 98.8  PLT 107*    Basic Metabolic Panel: Recent Labs  Lab 09/02/19 1046  NA 136  K 4.8  CL 93*  CO2 31  GLUCOSE 247*  BUN 31*  CREATININE 5.35*  CALCIUM 8.3*   GFR: Estimated Creatinine Clearance: 8.7 mL/min (A) (by C-G formula based on SCr of 5.35 mg/dL (H)). Liver Function Tests: Recent Labs  Lab 09/02/19 1046  AST 19  ALT 32  ALKPHOS 179*  BILITOT 0.5  PROT 7.1  ALBUMIN 3.5   No results for input(s): LIPASE, AMYLASE in the last 168 hours. No results for input(s): AMMONIA in the last 168 hours. Coagulation Profile: No results for input(s): INR, PROTIME in the last 168 hours. Cardiac Enzymes: No results for input(s): CKTOTAL, CKMB, CKMBINDEX, TROPONINI in the last 168 hours. BNP (last 3 results) No results for input(s): PROBNP in the last 8760 hours. HbA1C: No results for input(s): HGBA1C in the last 72 hours. CBG: Recent Labs  Lab 09/02/19 1046  GLUCAP 234*   Lipid Profile: No results for input(s): CHOL, HDL, LDLCALC, TRIG, CHOLHDL, LDLDIRECT in the last 72 hours. Thyroid Function Tests: No results for input(s): TSH, T4TOTAL, FREET4, T3FREE, THYROIDAB in the last 72 hours. Anemia Panel: No results for input(s): VITAMINB12, FOLATE, FERRITIN, TIBC, IRON, RETICCTPCT in the last 72 hours. Urine analysis:    Component Value Date/Time   COLORURINE RED (A) 10/17/2016 1643   APPEARANCEUR TURBID (A) 10/17/2016 1643   LABSPEC 1.014 10/17/2016 1643   PHURINE  10/17/2016 1643    TEST NOT REPORTED DUE TO COLOR INTERFERENCE OF URINE PIGMENT   GLUCOSEU (A) 10/17/2016 1643    TEST NOT REPORTED DUE TO COLOR INTERFERENCE OF URINE PIGMENT   HGBUR (A) 10/17/2016 1643    TEST NOT REPORTED DUE TO COLOR INTERFERENCE OF URINE PIGMENT   BILIRUBINUR (A) 10/17/2016 1643    TEST NOT REPORTED DUE TO COLOR INTERFERENCE OF URINE PIGMENT   KETONESUR (A) 10/17/2016 1643    TEST NOT REPORTED DUE TO COLOR INTERFERENCE OF URINE PIGMENT   PROTEINUR (A) 10/17/2016 1643    TEST NOT REPORTED DUE TO  COLOR INTERFERENCE OF URINE PIGMENT   NITRITE (A) 10/17/2016 1643    TEST NOT REPORTED DUE TO COLOR INTERFERENCE OF URINE PIGMENT   LEUKOCYTESUR (A) 10/17/2016 1643    TEST NOT REPORTED DUE TO COLOR INTERFERENCE OF URINE PIGMENT    Radiological Exams on Admission: DG Chest 2 View  Result Date: 09/02/2019 CLINICAL DATA:  Hypoxia EXAM: CHEST - 2 VIEW COMPARISON:  May 01, 2019 FINDINGS: There are small pleural effusions bilaterally with patchy airspace opacity in the periphery of each lung base. Mild atelectatic changes noted in each mid lung. Lungs elsewhere are clear. Heart is enlarged with pulmonary vascularity normal. Patient is status post coronary artery bypass grafting. There is aortic atherosclerosis. Central catheter tip is in the right atrium slightly beyond the cavoatrial junction. No pneumothorax. No bone lesions. IMPRESSION: Small pleural effusions bilaterally with ill-defined airspace opacity in the periphery of each lower lobe, concerning for foci of pneumonia. Areas of mild atelectatic change also present. Stable cardiomegaly with pulmonary vascularity normal. Postoperative changes noted. Central catheter tip in right atrium. Aortic Atherosclerosis (ICD10-I70.0). Electronically Signed   By: Lowella Grip III M.D.   On: 09/02/2019 11:59    EKG: Independently reviewed.  Sinus  rhythm LVH .    Assessment/Plan Principal Problem:   Acute on chronic combined systolic and diastolic CHF (congestive heart failure) (HCC) Active Problems:   Coronary artery disease involving left main coronary artery   ESRD (end stage renal disease) on dialysis (HCC)   Type 2 diabetes mellitus with stage 5 chronic kidney disease (HCC)   Peripheral vascular disease (HCC)   Acute on chronic respiratory failure with hypoxia (HCC)    Acute on chronic combined systolic and diastolic dysfunction CHF Most likely exacerbated by hypertensive urgency Patient does not void and will not require  diuretics She appears comfortable on 3 L of oxygen We will request nephrology consult for renal replacement therapy in a.m. Optimize blood pressure control Obtain 2D echocardiogram to assess LVEF   Hypertensive urgency Continue clonidine, nitrates and carvedilol    Diabetes mellitus with complications of end-stage renal disease Dialysis days are Monday/Wednesday and Friday Maintain consistent carbohydrate/renal diet Check blood sugars before meals and at bedtime   History of coronary artery disease, status post CABG Patient has mildly elevated troponin levels This may be secondary to demand ischemia We will request cardiology consult   History of peripheral arterial disease Status post bilateral BKA    DVT  ProphyLaxis: Apixaban Code Status: Full code Family Communication: Greater than 50% of time was spent discussing plan of care with patient at the bedside.  She verbalizes understanding and agrees with the plan. Disposition Plan: Back to previous home environment Consults called: Nephrology, Cardiology    Collier Bullock MD [facility]     09/02/2019, 5:32 PM

## 2019-09-02 NOTE — ED Provider Notes (Signed)
[facility] Emergency Department Provider Note ____________________________________________   First MD Initiated Contact with Patient 09/02/19 1503     (approximate)  I have reviewed the triage vital signs and the nursing notes.   HISTORY  Chief Complaint hypoxia    HPI Stephanie Ellis is a 69 y.o. female with PMH as noted below including ESRD on dialysis, CHF, and CAD who presents with generally not feeling well, gradual onset recently, and associated with increased weakness, chest tightness, and elevated blood pressure.  The patient does not normally use oxygen, but her facility reported to EMS that her O2 saturation was in the high 70s to low 80s on room air.  Patient states that she went to dialysis yesterday and the treatment was completed without complication.  The patient denies cough, fever, vomiting, or other acute symptoms.  Past Medical History:  Diagnosis Date  . (HFpEF) heart failure with preserved ejection fraction ([place])    a. 01/2018 Echo: EF 50-55%, no rwma, Gr1 DD, triv AI, mild MR. Midly dil LA/RV, mod reduced RV fxn. Irreg thickening of TV w/ mobile echodensity that appears to arise from valve.  . Anemia   . Anorexia   . Bacteremia due to Pseudomonas   . Carotid arterial disease ([place])    a. 01/2017 Carotid U/S: RICA <38, LICA <18.  Marland Kitchen Complication of anesthesia    a. Pt reports h/o complication on 9 different occasions - ? hypotension/arrest.  . 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 01/2015 Cath [facility]): 70% LM, p-mLAD 50-60% (Resting FFR 0.75), mRCA 80-90%, ~40 Ost OM & D1-->CABG; b. 01/2016 Staged PCI of LCX x 2 and RCA.  Marland Kitchen ESRD (end stage renal disease) on dialysis ([place])    a. ESRD secondary to acute kidney failure s/p CABG-->PD  . Essential hypertension   . GERD (gastroesophageal reflux disease)   . Heart murmur   . Hypercholesterolemia   . Myocardial infarction ([place]) 2016  . PVD (peripheral vascular  disease) ([place])    a. 01/30/2018 PV Angio: Sev Left Popliteal dzs s/p PTA w/ 49m lutonix DEB w/ mech aspiration of the L popliteal, L AT, and Left tibioperoneal trunck and peroneal artery.  . 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  [facility]: Dr. BMarland KitchenHaithcock: CABG x 3, LIMA to LAD, SVG to RCA, SVG to OM3, EVH  . Sinusitis 2019  . Type II diabetes mellitus with complication ([facility]    CAD    Patient Active Problem List   Diagnosis Date Noted  . Acute on chronic respiratory failure with hypoxia ([place] 09/02/2019  . Acute on chronic combined systolic (congestive) and diastolic (congestive) heart failure ([place] 09/02/2019  . 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 Severe anemia 05/01/2019  . Pneumonia due to COVID-19 virus 05/01/2019  . Resides in skilled nursing facility 12/29/2018  . CVA (cerebral vascular accident) ([place] 07/08/2018  . Infection of amputation stump ([place] 05/02/2018  . BKA stump complication ([place] 129/93/7169 . Unilateral complete BKA, right, initial encounter ([place] 03/30/2018  . Transient loss of consciousness 03/19/2018  . Transient alteration of awareness   . VT (ventricular tachycardia) ([place]   . Disorientation   . Hypertensive urgency 03/08/2018  . Status post below-knee amputation of both lower extremities ([place] 03/02/2018  . Ischemia of extremity 02/18/2018  . CKD (chronic kidney disease) stage 5, GFR less than 15 ml/min (HCC) 02/16/2018  . Diabetic osteomyelitis (HM[place]   . Peripheral vascular disease (HM[place]   .  Advanced care planning/counseling discussion   . Palliative care by specialist   . Goals of care, counseling/discussion   . Type 2 diabetes mellitus with stage 5 chronic kidney disease ([place]) 01/30/2018  . Sepsis ([place]) 01/29/2018  . Peripheral neuropathy 06/17/2017  . Encephalopathy 02/06/2017  . Hypertensive emergency 02/06/2017  . Endophthalmitis, left eye 12/05/2016  . Vomiting 10/24/2016  . Hematuria 10/17/2016  . Acute lower UTI 10/17/2016  . Anesthesia  complication 47/65/4650  . Chest pain with moderate risk for cardiac etiology 09/04/2016  . Steal syndrome dialysis vascular access, initial encounter ([place]) 06/18/2016  . 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 Coronary artery disease involving coronary bypass graft of native heart with angina pectoris ([place]) 02/10/2016  . Hypertension 02/02/2016  . Hyperlipidemia 02/02/2016  . Coronary artery disease involving left main coronary artery 10/04/2015  . Abdominal pain of unknown etiology 10/04/2015  . ESRD (end stage renal disease) on dialysis ([place])   . Type II diabetes mellitus with complication ([place])   . NSTEMI (non-ST elevated myocardial infarction) ([place])   . Right sided abdominal pain   . Elevated troponin 10/03/2015  . 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 Acute on chronic combined systolic and diastolic CHF (congestive heart failure) ([place]) 04/19/2014  . Anemia 09/20/2013  . Routine health maintenance 01/29/2013    Past Surgical History:  Procedure Laterality Date  . ABDOMINAL HYSTERECTOMY    . AMPUTATION Right 02/25/2018   Procedure: AMPUTATION BELOW KNEE;  Surgeon: Katha Cabal, MD;  Location: [facility] ORS;  Service: Vascular;  Laterality: Right;  . AMPUTATION Left 03/11/2018   Procedure: AMPUTATION BELOW KNEE;  Surgeon: Algernon Huxley, MD;  Location: [facility] ORS;  Service: Vascular;  Laterality: Left;  . AMPUTATION Bilateral 04/13/2018   Procedure: REVISION OF BILATERAL AMPUTATIONS BELOW KNEE WITH WOUND VAC APPLICATIONS;  Surgeon: Evaristo Bury, MD;  Location: [facility] ORS;  Service: Vascular;  Laterality: Bilateral;  . AMPUTATION TOE Left 02/06/2018   Procedure: AMPUTATION GREAT TOE;  Surgeon: Samara Deist, DPM;  Location: [facility] ORS;  Service: Podiatry;  Laterality: Left;  . APPLICATION OF WOUND VAC Left 04/13/2018   Procedure: APPLICATION OF WOUND VAC;  Surgeon: Evaristo Bury, MD;  Location: [facility] ORS;  Service: Vascular;  Laterality: Left;  . ARTERIOVENOUS GRAFT  PLACEMENT  05/2016  . AV FISTULA PLACEMENT Left 05/30/2016   Procedure: ARTERIOVENOUS graft;  Surgeon: Algernon Huxley, MD;  Location: [facility] OR[facility];  Service: Vascular;  Laterality: Left;  . CAPD INSERTION N/A 10/30/2017   Procedure: LAPAROSCOPIC INSERTION CONTINUOUS AMBULATORY PERITONEAL DIALYSIS  (CAPD) CATHETER;  Surgeon: Algernon Huxley, MD;  Location: [facility] ORS;  Service: Vascular;  Laterality: N/A;  . CARDIAC CATHETERIZATION  01/2015   [facility]: Ost LM 70%, p-m LAD 50-60% (Rest FFR + @ 0.75), mRCA 80-90%, ostD1 40%, pOM1 40%  . CATARACT EXTRACTION W/PHACO Left 01/18/2016   Procedure: CATARACT EXTRACTION PHACO AND INTRAOCULAR LENS PLACEMENT (IOC);  Surgeon: Eulogio Bear, MD;  Location: [facility] OR[facility];  Service: Ophthalmology;  Laterality: Left;  [place] 1.05 AP% 15.5 CDE 10.16 Fluid Pack Lot # [id number]  . CATARACT EXTRACTION W/PHACO Right 08/01/2016   Procedure: CATARACT EXTRACTION PHACO AND INTRAOCULAR LENS PLACEMENT (IOC);  Surgeon: Eulogio Bear, MD;  Location: [facility];  Service: Ophthalmology;  Laterality: Right;  [place] 01:00.6 AP% 11.4 CDE 6.93  LOT # [id number]  . COLONOSCOPY    . CORONARY ANGIOPLASTY     SENTS 02/12/16  . CORONARY ARTERY BYPASS GRAFT  03/28/15  [facility]: Dr. Waldemar Dickens: LIMA to LAD, SVG to RCA, SVG to OM3, EVH  . DIALYSIS/PERMA CATHETER INSERTION N/A 02/11/2018   Procedure: DIALYSIS/PERMA CATHETER INSERTION;  Surgeon: Algernon Huxley, MD;  Location: [place] CV LAB;  Service: Cardiovascular;  Laterality: N/A;  . DIALYSIS/PERMA CATHETER REMOVAL Right 02/04/2018   Procedure: DIALYSIS/PERMA CATHETER REMOVAL;  Surgeon: Katha Cabal, MD;  Location: [place] CV LAB;  Service: Cardiovascular;  Laterality: Right;  . ESOPHAGOGASTRODUODENOSCOPY (EGD) WITH PROPOFOL N/A 10/24/2016   Procedure: ESOPHAGOGASTRODUODENOSCOPY (EGD) WITH PROPOFOL;  Surgeon: [name], MD;  Location: [facility];  Service: Endoscopy;  Laterality: N/A;  . EYE SURGERY Bilateral    cataract surgery  . EYE  SURGERY     drains for glaucoma  . INSERTION EXPRESS TUBE SHUNT Right 08/01/2016   Procedure: INSERTION EXPRESS TUBE SHUNT;  Surgeon: Eulogio Bear, MD;  Location: [facility];  Service: Ophthalmology;  Laterality: Right;  . INSERTION OF AHMED VALVE Left 08/15/2016   Procedure: INSERTION OF AHMED VALVE;  Surgeon: Eulogio Bear, MD;  Location: [facility];  Service: Ophthalmology;  Laterality: Left;  . INSERTION OF DIALYSIS CATHETER    . LOWER EXTREMITY ANGIOGRAPHY Right 02/19/2018   Procedure: Lower Extremity Angiography;  Surgeon: Algernon Huxley, MD;  Location: [place] CV LAB;  Service: Cardiovascular;  Laterality: Right;  . LOWER EXTREMITY ANGIOGRAPHY Left 01/30/2018   Procedure: Lower Extremity Angiography;  Surgeon: Katha Cabal, MD;  Location: [place] CV LAB;  Service: Cardiovascular;  Laterality: Left;  . REMOVAL OF A DIALYSIS CATHETER N/A 02/25/2018   Procedure: REMOVAL OF A DIALYSIS CATHETER;  Surgeon: Katha Cabal, MD;  Location: [facility];  Service: Vascular;  Laterality: N/A;  . TEMPORARY DIALYSIS CATHETER N/A 02/06/2018   Procedure: TEMPORARY DIALYSIS CATHETER;  Surgeon: Katha Cabal, MD;  Location: [place] CV LAB;  Service: Cardiovascular;  Laterality: N/A;  . TRANSTHORACIC ECHOCARDIOGRAM  January 2017    EF 60-65%. GR 2 DD. Mild degenerative mitral valve disease but no prolapse or regurgitation. Mild left atrial dilation. Mild to moderate LVH. Pericardial effusion gone  . UPPER EXTREMITY ANGIOGRAPHY Left 06/27/2016   Procedure: Upper Extremity Angiography;  Surgeon: Algernon Huxley, MD;  Location: [place] CV LAB;  Service: Cardiovascular;  Laterality: Left;  . UPPER EXTREMITY ANGIOGRAPHY Left 08/22/2016   Procedure: Upper Extremity Angiography;  Surgeon: Algernon Huxley, MD;  Location: [place] CV LAB;  Service: Cardiovascular;  Laterality: Left;  Marland Kitchen VASCULAR SURGERY    . WOUND DEBRIDEMENT Left 05/08/2018   Procedure: DEBRIDEMENT OF LEFT LEG BKA;   Surgeon: Algernon Huxley, MD;  Location: [facility] OR[facility];  Service: General;  Laterality: Left;    Prior to Admission medications   Medication Sig Start Date End Date Taking? Authorizing Provider  apixaban (ELIQUIS) 2.5 MG TABS tablet Take 1 tablet (2.5 mg total) by mouth 2 (two) times daily. 02/15/18  Yes Vaughan Basta, MD  aspirin EC 81 MG tablet Take 1 tablet (81 mg total) by mouth daily. 10/19/16  Yes Dustin Flock, MD  atorvastatin (LIPITOR) 80 MG tablet Take 80 mg by mouth at bedtime.  05/05/17  Yes [provider]  brimonidine (ALPHAGAN) 0.2 % ophthalmic solution Place 1 drop into both eyes 3 (three) times daily. 03/18/18  Yes Mody, Ulice Bold, MD  carvedilol (COREG) 25 MG tablet Take 1 tablet (25 mg total) by mouth 2 (two) times daily. 07/13/18  Yes Ojie, Jude, MD  cloNIDine (CATAPRES) 0.1 MG tablet Take 0.1 mg by mouth  daily as needed (BP >160/80).   Yes [provider]  fluticasone (FLONASE) 50 MCG/ACT nasal spray Place 2 sprays into both nostrils daily.   Yes [provider]  HYDROcodone-acetaminophen (NORCO/VICODIN) 5-325 MG tablet Take 0.5 tablets by mouth every 6 (six) hours as needed for moderate pain.   Yes [provider]  insulin aspart (NOVOLOG) 100 UNIT/ML injection Inject 0-6 Units into the skin See admin instructions. Inject under the skin according to blood glucose reading:  <201: 0u 201- 250: 2u 251-300: 4u 301-350: 5u 351-400: 6u   Yes [provider]  insulin glargine (LANTUS) 100 UNIT/ML injection Inject 5 Units into the skin daily in the afternoon.    Yes [provider]  isosorbide mononitrate (IMDUR) 30 MG 24 hr tablet Take 30 mg by mouth daily.    Yes [provider]  levETIRAcetam (KEPPRA) 250 MG tablet Three times weekly after dialysis Patient taking differently: Take 250 mg by mouth Every Tuesday,Thursday,and Saturday with dialysis.  05/12/18  Yes Loletha Grayer, MD  levETIRAcetam (KEPPRA) 500 MG tablet  Take 1 tablet (500 mg total) by mouth 2 (two) times daily. 03/23/18  Yes Gouru, Illene Silver, MD  meclizine (ANTIVERT) 25 MG tablet Take 25 mg by mouth every 4 (four) hours as needed for dizziness.   Yes [provider]  mirtazapine (REMERON) 15 MG tablet Take 7.5 mg by mouth daily.    Yes [provider]  Omega-3 1000 MG CAPS Take 2 capsules by mouth daily.    Yes [provider]  pantoprazole (PROTONIX) 40 MG tablet Take 1 tablet (40 mg total) by mouth daily. 06/04/19  Yes [name], MD  Polyvinyl Alcohol-Povidone PF (REFRESH) 1.4-0.6 % SOLN Place 1 drop into both eyes 2 (two) times a day.    Yes [provider]  rOPINIRole (REQUIP) 0.25 MG tablet Take 1 tablet (0.25 mg total) by mouth at bedtime. Patient taking differently: Take 0.25 mg by mouth daily.  05/12/18  Yes Wieting, Richard, MD  sevelamer carbonate (RENVELA) 800 MG tablet Take 800 mg by mouth 3 (three) times daily with meals.    Yes [provider]  timolol (TIMOPTIC) 0.5 % ophthalmic solution Place 1 drop into both eyes 3 (three) times daily. 03/18/18  Yes Bettey Costa, MD  vitamin B-12 (CYANOCOBALAMIN) 1000 MCG tablet Take 1 tablet (1,000 mcg total) by mouth daily. 02/15/18  Yes Vaughan Basta, MD  traMADol (ULTRAM) 50 MG tablet Take 50 mg by mouth every 8 (eight) hours as needed.  08/27/18   [provider]    Allergies Chlorthalidone, Fentanyl, Midazolam, Ace inhibitors, Angiotensin receptor blockers, Norvasc [amlodipine], and Phenergan [promethazine hcl]  Family History  Problem Relation Age of Onset  . Diabetes Mellitus II Mother   . Heart failure Mother   . Pancreatic cancer Father     Social History Social History   Tobacco Use  . Smoking status: Never Smoker  . Smokeless tobacco: Never Used  Substance Use Topics  . Alcohol use: No  . Drug use: No    Review of Systems  Constitutional: No fever/chills. Eyes: No redness. ENT: No sore throat. Cardiovascular:  Denies chest pain. Respiratory: Positive for shortness of breath. Gastrointestinal: No vomiting or diarrhea.  Genitourinary: Negative for dysuria.  Musculoskeletal: Negative for back pain. Skin: Negative for rash. Neurological: Negative for headaches.   ____________________________________________   PHYSICAL EXAM:  VITAL SIGNS: ED Triage Vitals  Enc Vitals Group     BP 09/02/19 1041 (!) 199/68  Pulse Rate 09/02/19 1041 60     Resp 09/02/19 1041 20     Temp 09/02/19 1041 98.1 F (36.7 C)     Temp Source 09/02/19 1041 Oral     SpO2 09/02/19 1041 100 %     Weight 09/02/19 1040 126 lb (57.2 kg)     Height 09/02/19 1040 5' 4"  (1.626 m)     Head Circumference --      Peak Flow --      Pain Score 09/02/19 1044 0     Pain Loc --      Pain Edu? --      Excl. in [place]? --     Constitutional: Alert and oriented.  Relatively comfortable appearing, in no acute distress. Eyes: Conjunctivae are normal.  Head: Atraumatic. Nose: No congestion/rhinnorhea. Mouth/Throat: Mucous membranes are moist.   Neck: Normal range of motion.  Cardiovascular: Normal rate, regular rhythm. Grossly normal heart sounds.  Good peripheral circulation. Respiratory: Normal respiratory effort.  No retractions.  Somewhat decreased breath sounds bilaterally. Gastrointestinal: No distention.  Musculoskeletal: Extremities warm and well perfused.  Neurologic:  Normal speech and language. No gross focal neurologic deficits are appreciated.  Skin:  Skin is warm and dry. No rash noted. Psychiatric: Mood and affect are normal. Speech and behavior are normal.  ____________________________________________   LABS (all labs ordered are listed, but only abnormal results are displayed)  Labs Reviewed  COMPREHENSIVE METABOLIC PANEL - Abnormal; Notable for the following components:      Result Value   Chloride 93 (*)    Glucose, Bld 247 (*)    BUN 31 (*)    Creatinine, Ser 5.35 (*)    Calcium 8.3 (*)    Alkaline  Phosphatase 179 (*)    GFR calc non Af Amer 8 (*)    GFR calc Af Amer 9 (*)    All other components within normal limits  CBC WITH DIFFERENTIAL/PLATELET - Abnormal; Notable for the following components:   RBC 3.27 (*)    Hemoglobin 9.9 (*)    HCT 32.3 (*)    RDW 17.2 (*)    Platelets 107 (*)    All other components within normal limits  GLUCOSE, CAPILLARY - Abnormal; Notable for the following components:   Glucose-Capillary 234 (*)    All other components within normal limits  BRAIN NATRIURETIC PEPTIDE - Abnormal; Notable for the following components:   B Natriuretic Peptide >4,500.0 (*)    All other components within normal limits  TROPONIN I (HIGH SENSITIVITY) - Abnormal; Notable for the following components:   Troponin I (High Sensitivity) 57 (*)    All other components within normal limits  SARS CORONAVIRUS 2 BY RT PCR (HOSPITAL ORDER, [place] LAB)  LACTIC ACID, PLASMA  URINALYSIS, COMPLETE (UACMP) WITH MICROSCOPIC  HEMOGLOBIN A1C  CBC  COMPREHENSIVE METABOLIC PANEL  TROPONIN I (HIGH SENSITIVITY)   ____________________________________________  EKG  ED ECG REPORT I, Arta Silence, the attending physician, personally viewed and interpreted this ECG.  Date: 09/02/2019 EKG Time: 1607 Rate: 60 Rhythm: normal sinus rhythm QRS Axis: normal Intervals: RBBB ST/T Wave abnormalities: LVH with repolarization abnormality Narrative Interpretation: no evidence of acute ischemia; no significant change when compared with EKG from 05/01/2019  ____________________________________________  RADIOLOGY  CXR: Bilateral pleural effusions and lower lobe opacity  ____________________________________________   PROCEDURES  Procedure(s) performed: No  Procedures  Critical Care performed: No ____________________________________________   INITIAL IMPRESSION / ASSESSMENT AND PLAN / ED COURSE  Pertinent  labs & imaging results that were available during  my care of the patient were reviewed by me and considered in my medical decision making (see chart for details).  69 year old female with PMH as noted above including ESRD on dialysis, CHF, and CAD presents with shortness of breath, chest tightness, and generally feeling unwell over the last several days.  She states that she had dialysis yesterday and it was completed without issues.  I reviewed the past medical records in [place].  The patient was most recently admitted in January due to anemia and COVID-19.  On exam, she is relatively comfortable appearing.  Her vital signs are normal except for hypertension and O2 saturation was in the low 80s on room air.  She is 100% on 2 L by nasal cannula.  She has somewhat decreased breath sounds bilaterally.  Differential includes fluid overload related to ESRD, CHF with pulmonary edema, pneumonia, or less likely ACS or infectious etiology.  Chest x-ray shows bilateral pleural effusions and bilateral lower lobe opacities, and the lab work-up so far is consistent with the patient's baseline.  Given the lack of cough, fever, leukocytosis, or an elevated lactate, my suspicion for pneumonia is extremely low.  Patient was placed in the room and I was able to evaluate her after she had been in the ED for over 4.5 hours.  She has not yet had an EKG or cardiac enzymes obtained, so I have ordered these as well.  Given the hypoxia, she will need admission.  ----------------------------------------- 6:00 PM on 09/02/2019 -----------------------------------------  Troponin is mildly elevated.  EKG shows no acute findings.  BNP is also significantly elevated consistent with fluid overload.  The blood pressure improved with hydralazine.  I discussed the case with Dr. Francine Graven from the hospitalist service for admission.  ____________________________________________   FINAL CLINICAL IMPRESSION(S) / ED DIAGNOSES  Final diagnoses:  Acute respiratory failure with  hypoxia (HCC)  Hypertension, unspecified type      NEW MEDICATIONS STARTED DURING THIS VISIT:  New Prescriptions   No medications on file     Note:  This document was prepared using Dragon voice recognition software and may include unintentional dictation errors.    Arta Silence, MD 09/03/19 [phone]

## 2019-09-03 ENCOUNTER — Other Ambulatory Visit: Payer: Self-pay

## 2019-09-03 DIAGNOSIS — I10 Essential (primary) hypertension: Secondary | ICD-10-CM

## 2019-09-03 DIAGNOSIS — R079 Chest pain, unspecified: Secondary | ICD-10-CM

## 2019-09-03 DIAGNOSIS — I5023 Acute on chronic systolic (congestive) heart failure: Secondary | ICD-10-CM

## 2019-09-03 DIAGNOSIS — I5022 Chronic systolic (congestive) heart failure: Secondary | ICD-10-CM

## 2019-09-03 DIAGNOSIS — N186 End stage renal disease: Secondary | ICD-10-CM

## 2019-09-03 DIAGNOSIS — I739 Peripheral vascular disease, unspecified: Secondary | ICD-10-CM

## 2019-09-03 DIAGNOSIS — Z992 Dependence on renal dialysis: Secondary | ICD-10-CM

## 2019-09-03 LAB — GLUCOSE, CAPILLARY
Glucose-Capillary: 137 mg/dL — ABNORMAL HIGH (ref 70–99)
Glucose-Capillary: 144 mg/dL — ABNORMAL HIGH (ref 70–99)
Glucose-Capillary: 147 mg/dL — ABNORMAL HIGH (ref 70–99)
Glucose-Capillary: 190 mg/dL — ABNORMAL HIGH (ref 70–99)
Glucose-Capillary: 207 mg/dL — ABNORMAL HIGH (ref 70–99)
Glucose-Capillary: 258 mg/dL — ABNORMAL HIGH (ref 70–99)

## 2019-09-03 LAB — COMPREHENSIVE METABOLIC PANEL
ALT: 30 U/L (ref 0–44)
AST: 18 U/L (ref 15–41)
Albumin: 3.5 g/dL (ref 3.5–5.0)
Alkaline Phosphatase: 180 U/L — ABNORMAL HIGH (ref 38–126)
Anion gap: 14 (ref 5–15)
BUN: 42 mg/dL — ABNORMAL HIGH (ref 8–23)
CO2: 31 mmol/L (ref 22–32)
Calcium: 8.2 mg/dL — ABNORMAL LOW (ref 8.9–10.3)
Chloride: 94 mmol/L — ABNORMAL LOW (ref 98–111)
Creatinine, Ser: 6.19 mg/dL — ABNORMAL HIGH (ref 0.44–1.00)
GFR calc Af Amer: 7 mL/min — ABNORMAL LOW (ref >60)
GFR calc non Af Amer: 6 mL/min — ABNORMAL LOW (ref >60)
Glucose, Bld: 200 mg/dL — ABNORMAL HIGH (ref 70–99)
Potassium: 4.2 mmol/L (ref 3.5–5.1)
Sodium: 139 mmol/L (ref 135–145)
Total Bilirubin: 0.5 mg/dL (ref 0.3–1.2)
Total Protein: 7.3 g/dL (ref 6.5–8.1)

## 2019-09-03 LAB — HEMOGLOBIN A1C
Hgb A1c MFr Bld: 7.6 % — ABNORMAL HIGH (ref 4.8–5.6)
Mean Plasma Glucose: 171.42 mg/dL

## 2019-09-03 LAB — CBC
HCT: 32.1 % — ABNORMAL LOW (ref 36.0–46.0)
Hemoglobin: 10.1 g/dL — ABNORMAL LOW (ref 12.0–15.0)
MCH: 30.5 pg (ref 26.0–34.0)
MCHC: 31.5 g/dL (ref 30.0–36.0)
MCV: 97 fL (ref 80.0–100.0)
Platelets: 111 10*3/uL — ABNORMAL LOW (ref 150–400)
RBC: 3.31 MIL/uL — ABNORMAL LOW (ref 3.87–5.11)
RDW: 17.2 % — ABNORMAL HIGH (ref 11.5–15.5)
WBC: 6.9 10*3/uL (ref 4.0–10.5)
nRBC: 0 % (ref 0.0–0.2)

## 2019-09-03 LAB — MRSA PCR SCREENING: MRSA by PCR: NEGATIVE

## 2019-09-03 LAB — TROPONIN I (HIGH SENSITIVITY): Troponin I (High Sensitivity): 59 ng/L — ABNORMAL HIGH (ref <18)

## 2019-09-03 LAB — PHOSPHORUS: Phosphorus: 5.6 mg/dL — ABNORMAL HIGH (ref 2.5–4.6)

## 2019-09-03 MED ORDER — HEPARIN SODIUM (PORCINE) 1000 UNIT/ML DIALYSIS
1000.0000 [IU] | INTRAMUSCULAR | Status: DC | PRN
  Filled 2019-09-03: qty 1

## 2019-09-03 MED ORDER — ALTEPLASE 2 MG IJ SOLR: 2.0000 mg | Freq: Once | INTRAMUSCULAR | Status: DC | PRN

## 2019-09-03 MED ORDER — SODIUM CHLORIDE 0.9 % IV SOLN: 100.0000 mL | INTRAVENOUS | Status: DC | PRN

## 2019-09-03 MED ORDER — LIDOCAINE HCL (PF) 1 % IJ SOLN
5.0000 mL | INTRAMUSCULAR | Status: DC | PRN
  Filled 2019-09-03: qty 5

## 2019-09-03 MED ORDER — LEVETIRACETAM 250 MG PO TABS
250.0000 mg | ORAL_TABLET | ORAL | Status: DC
  Administered 2019-09-03: 250 mg via ORAL
  Filled 2019-09-03 (×2): qty 1

## 2019-09-03 MED ORDER — PENTAFLUOROPROP-TETRAFLUOROETH EX AERO
1.0000 "application " | INHALATION_SPRAY | CUTANEOUS | Status: DC | PRN
  Filled 2019-09-03: qty 30

## 2019-09-03 MED ORDER — CHLORHEXIDINE GLUCONATE CLOTH 2 % EX PADS
6.0000 | MEDICATED_PAD | Freq: Every day | CUTANEOUS | Status: DC
  Administered 2019-09-04: 6 via TOPICAL

## 2019-09-03 MED ORDER — INSULIN GLARGINE 100 UNIT/ML ~~LOC~~ SOLN
5.0000 [IU] | Freq: Every day | SUBCUTANEOUS | Status: DC
  Administered 2019-09-03 – 2019-09-05 (×2): 5 [IU] via SUBCUTANEOUS
  Filled 2019-09-03 (×4): qty 0.05

## 2019-09-03 MED ORDER — POLYVINYL ALCOHOL 1.4 % OP SOLN
1.0000 [drp] | Freq: Two times a day (BID) | OPHTHALMIC | Status: DC
  Administered 2019-09-03 – 2019-09-05 (×6): 1 [drp] via OPHTHALMIC
  Filled 2019-09-03: qty 15

## 2019-09-03 MED ORDER — CHLORHEXIDINE GLUCONATE CLOTH 2 % EX PADS: 6.0000 | MEDICATED_PAD | Freq: Every day | CUTANEOUS | Status: DC

## 2019-09-03 MED ORDER — LIDOCAINE-PRILOCAINE 2.5-2.5 % EX CREA
1.0000 "application " | TOPICAL_CREAM | CUTANEOUS | Status: DC | PRN
  Filled 2019-09-03: qty 5

## 2019-09-03 MED ORDER — ORAL CARE MOUTH RINSE
15.0000 mL | Freq: Two times a day (BID) | OROMUCOSAL | Status: DC
  Administered 2019-09-04 – 2019-09-05 (×4): 15 mL via OROMUCOSAL

## 2019-09-03 NOTE — Progress Notes (Signed)
Infection Prevention  Received call from: RN Stephanie  Unit: [facility] 2a Regarding: Lifting Contact isolation IP Recommendation: Neg MRSA PCR this visit (May 21). Positive in Jan 21, MRSA flag in place. CRE, VRE cultures were from wounds in lower extremities, patient has since undergone bilateral amputations. CRE, VRE, MDRO precautions currently lifted.

## 2019-09-03 NOTE — Progress Notes (Signed)
[facility]  ROUNDING NOTE   Subjective:  Patient presented with chest pain and shortness of breath. Feeling better today. Due for hemodialysis today. Ultrafiltration planned is 2.5 kg.   Objective:  Vital signs in last 24 hours:  Temp:  [97.8 F (36.6 C)-98.3 F (36.8 C)] (P) 98.1 F (36.7 C) (05/14 1341) Pulse Rate:  [53-64] (P) 62 (05/14 1341) Resp:  [8-25] (P) 12 (05/14 1341) BP: (109-212)/(50-88) (P) 213/77 (05/14 1341) SpO2:  [90 %-100 %] (P) 100 % (05/14 1341) FiO2 (%):  [98 %] 98 % (05/14 1150) Weight:  [56.2 kg-57.6 kg] 57.6 kg (05/14 1150)  Weight change:  Filed Weights   09/02/19 1044 09/03/19 0157 09/03/19 1150  Weight: 57.2 kg 56.2 kg 57.6 kg    Intake/Output: No intake/output data recorded.   Intake/Output this shift:  Total I/O In: 480 [P.O.:480] Out: -   Physical Exam: General: No acute distress  Head: Normocephalic, atraumatic. Moist oral mucosal membranes  Eyes: Anicteric  Neck: Supple, trachea midline  Lungs:  Clear to auscultation, normal effort  Heart: S1S2 no rubs  Abdomen:  Soft, nontender, bowel sounds present  Extremities: B/L lower extremity amputations  Neurologic: Awake, alert, following commands  Skin: No lesions  Access: Left IJ PermCath    Basic Metabolic Panel: Recent Labs  Lab 09/02/19 1046 09/03/19 0039  NA 136 139  K 4.8 4.2  CL 93* 94*  CO2 31 31  GLUCOSE 247* 200*  BUN 31* 42*  CREATININE 5.35* 6.19*  CALCIUM 8.3* 8.2*  PHOS  --  5.6*    Liver Function Tests: Recent Labs  Lab 09/02/19 1046 09/03/19 0039  AST 19 18  ALT 32 30  ALKPHOS 179* 180*  BILITOT 0.5 0.5  PROT 7.1 7.3  ALBUMIN 3.5 3.5   No results for input(s): LIPASE, AMYLASE in the last 168 hours. No results for input(s): AMMONIA in the last 168 hours.  CBC: Recent Labs  Lab 09/02/19 1046 09/03/19 0039  WBC 5.4 6.9  NEUTROABS 3.8  --   HGB 9.9* 10.1*  HCT 32.3* 32.1*  MCV 98.8 97.0  PLT 107* 111*    Cardiac  Enzymes: No results for input(s): CKTOTAL, CKMB, CKMBINDEX, TROPONINI in the last 168 hours.  BNP: Invalid input(s): POCBNP  CBG: Recent Labs  Lab 09/02/19 1046 09/03/19 0041 09/03/19 0828 09/03/19 1227 09/03/19 1228  GLUCAP 234* 190* 147* 144* 137*    Microbiology: Results for orders placed or performed during the hospital encounter of 09/02/19  SARS Coronavirus 2 by RT PCR (hospital order, performed in [facility] hospital lab) Nasopharyngeal Nasopharyngeal Swab     Status: None   Collection Time: 09/02/19  3:55 PM   Specimen: Nasopharyngeal Swab  Result Value Ref Range Status   SARS Coronavirus 2 NEGATIVE NEGATIVE Final    Comment: (NOTE) SARS-CoV-2 target nucleic acids are NOT DETECTED. The SARS-CoV-2 RNA is generally detectable in upper and lower respiratory specimens during the acute phase of infection. The lowest concentration of SARS-CoV-2 viral copies this assay can detect is 250 copies / mL. A negative result does not preclude SARS-CoV-2 infection and should not be used as the sole basis for treatment or other patient management decisions.  A negative result may occur with improper specimen collection / handling, submission of specimen other than nasopharyngeal swab, presence of viral mutation(s) within the areas targeted by this assay, and inadequate number of viral copies (<250 copies / mL). A negative result must be combined with clinical observations, patient history,  and epidemiological information. Fact Sheet for Patients:   StrictlyIdeas.no Fact Sheet for Healthcare Providers: BankingDealers.co.za This test is not yet approved or cleared  by the [place] FDA and has been authorized for detection and/or diagnosis of SARS-CoV-2 by FDA under an Emergency Use Authorization (EUA).  This EUA will remain in effect (meaning this test can be used) for the duration of the COVID-19 declaration under Section  564(b)(1) of the Act, 21 U.S.C. section 360bbb-3(b)(1), unless the authorization is terminated or revoked sooner. Performed at [facility], [address] [place], [place] [address]   MRSA PCR Screening     Status: None   Collection Time: 09/03/19  3:11 AM   Specimen: Nasal Mucosa; Nasopharyngeal  Result Value Ref Range Status   MRSA by PCR NEGATIVE NEGATIVE Final    Comment:        The GeneXpert MRSA Assay (FDA approved for NASAL specimens only), is one component of a comprehensive MRSA colonization surveillance program. It is not intended to diagnose MRSA infection nor to guide or monitor treatment for MRSA infections. Performed at [facility], [address] [place], [place] [address]     Coagulation Studies: No results for input(s): LABPROT, INR in the last 72 hours.  Urinalysis: No results for input(s): COLORURINE, LABSPEC, PHURINE, GLUCOSEU, HGBUR, BILIRUBINUR, KETONESUR, PROTEINUR, UROBILINOGEN, NITRITE, LEUKOCYTESUR in the last 72 hours.  Invalid input(s): APPERANCEUR    Imaging: DG Chest 2 View  Result Date: 09/02/2019 CLINICAL DATA:  Hypoxia EXAM: CHEST - 2 VIEW COMPARISON:  May 01, 2019 FINDINGS: There are small pleural effusions bilaterally with patchy airspace opacity in the periphery of each lung base. Mild atelectatic changes noted in each mid lung. Lungs elsewhere are clear. Heart is enlarged with pulmonary vascularity normal. Patient is status post coronary artery bypass grafting. There is aortic atherosclerosis. Central catheter tip is in the right atrium slightly beyond the cavoatrial junction. No pneumothorax. No bone lesions. IMPRESSION: Small pleural effusions bilaterally with ill-defined airspace opacity in the periphery of each lower lobe, concerning for foci of pneumonia. Areas of mild atelectatic change also present. Stable cardiomegaly with pulmonary vascularity normal. Postoperative changes noted. Central catheter tip in  right atrium. Aortic Atherosclerosis (ICD10-I70.0). Electronically Signed   By: Lowella Grip III M.D.   On: 09/02/2019 11:59   CT CHEST WO CONTRAST  Result Date: 09/02/2019 CLINICAL DATA:  Patient woke up not feeling well. Low oxygen saturations. EXAM: CT CHEST WITHOUT CONTRAST TECHNIQUE: Multidetector CT imaging of the chest was performed following the standard protocol without IV contrast. COMPARISON:  02/12/2018 FINDINGS: Cardiovascular: Heart is enlarged. There is dense atherosclerotic calcification of the coronary arteries. LEFT IJ central line tip is in the RIGHT atrium. There is no pericardial effusion. There is dense atherosclerotic calcification of the thoracic aorta, not associated with aneurysm. Mediastinum/Nodes: There is mild thickening of the esophageal wall consistent with esophagitis. There are numerous small mediastinal lymph nodes, not reaching grade 2 for pathologic enlargement and possibly reactive. Lungs/Pleura: Small bilateral pleural effusions. Pleural calcifications are stable. There is bibasilar atelectasis. There is septal thickening and mild dependent airspace filling of RIGHT greater than LEFT. Findings are consistent mild pulmonary edema. Upper Abdomen: Calcifications are identified within the liver and spleen consistent with prior granulomatous disease. Punctate calcifications within the UPPER poles of the kidneys may represent intrarenal calculi or vascular calcifications. Musculoskeletal: No chest wall mass or suspicious bone lesions identified. Median sternotomy. IMPRESSION: 1. Cardiomegaly, small bilateral pleural effusions, and mild pulmonary edema.  2. Coronary artery disease. 3. Mild thickening of the esophageal wall consistent with esophagitis. 4. Aortic Atherosclerosis (ICD10-I70.0). Electronically Signed   By: Nolon Nations M.D.   On: 09/02/2019 18:03     Medications:   . sodium chloride    . sodium chloride     . apixaban  2.5 mg Oral BID  . aspirin EC   81 mg Oral Daily  . atorvastatin  80 mg Oral QHS  . brimonidine  1 drop Both Eyes TID  . carvedilol  25 mg Oral BID  . Chlorhexidine Gluconate Cloth  6 each Topical Q0600  . fluticasone  2 spray Each Nare Daily  . insulin aspart  0-6 Units Subcutaneous TID WC  . insulin glargine  5 Units Subcutaneous QHS  . isosorbide mononitrate  30 mg Oral Daily  . levETIRAcetam  250 mg Oral Q M,W,F-HD  . mouth rinse  15 mL Mouth Rinse BID  . mirtazapine  7.5 mg Oral Daily  . omega-3 acid ethyl esters  2 capsule Oral Daily  . pantoprazole  40 mg Oral Daily  . polyvinyl alcohol  1 drop Both Eyes BID  . rOPINIRole  0.25 mg Oral QHS  . sevelamer carbonate  800 mg Oral TID WC  . sodium chloride flush  3 mL Intravenous Once  . timolol  1 drop Both Eyes TID  . vitamin B-12  1,000 mcg Oral Daily   sodium chloride, sodium chloride, alteplase, cloNIDine, heparin, lidocaine (PF), lidocaine-prilocaine, meclizine, ondansetron **OR** ondansetron (ZOFRAN) IV, pentafluoroprop-tetrafluoroeth, traMADol  Assessment/ Plan:  69 y.o. female with past medical history of ESRD on HD, hypertension, peripheral vascular disease, bilateral below the knee amputation, coronary disease status post CABG, diabetes mellitus type 2, history of endocarditis, congestive heart failure admitted with acute diastolic heart failure and shortness of breath.  [facility] nephrology/MWF/[facility] [address]/left IJ PermCath  1.  ESRD on HD MWF.  Patient due for hemodialysis treatment today.  Orders have been prepared.  UF target 2.5 kg.  2.  Anemia of chronic kidney disease.  Hemoglobin currently 10.1.  Hold off on Mircera for now.  3.  Secondary hyperparathyroidism.  Serum phosphorus slightly high at 5.6.  Maintain the patient on Renvela 800 mg p.o. 3 times daily.  4.  Shortness of breath/mild pulmonary edema.  Pulmonary edema noted on chest CT.  Small bilateral effusions noted as well.  We are planning for dialysis today with ultrafiltration  target of 2.5 kg as above.   LOS: 1 Sasuke Yaffe 5/14/20213:45 PM

## 2019-09-03 NOTE — Progress Notes (Signed)
HD Complete    09/03/19 1341  Vital Signs  Temp 98.1 F (36.7 C)  Temp Source Oral  Pulse Rate 62  Pulse Rate Source Dinamap  Resp 12  BP (!) 213/77  BP Location Right Arm  BP Method Automatic  Patient Position (if appropriate) Lying  Oxygen Therapy  SpO2 100 %  O2 Device Nasal Cannula  O2 Flow Rate (L/min) 3 L/min  During Hemodialysis Assessment  KECN 31.9 KECN  Dialysis Fluid Bolus Normal Saline  Bolus Amount (mL) 250 mL  Intra-Hemodialysis Comments Tx completed

## 2019-09-03 NOTE — Progress Notes (Signed)
Hemodialysis patient known at [facility] TTS 11:00am. Please contact me with any dialysis placement concerns.  Elvera Bicker Dialysis Coordinator [phone]

## 2019-09-03 NOTE — Progress Notes (Signed)
Patient ID: Stephanie Ellis, female   DOB: December 28, 1950, 69 y.o.   MRN: [id number] [facility] PROGRESS NOTE  Ammie Warrick YYQ:[id number] DOB: [birth year] DOA: 09/02/2019 PCP: Kirk Ruths, MD  HPI/Subjective: Patient came in with chest pain and shortness of breath.  Currently today feeling better without any chest pain or shortness of breath.  Today is her normal dialysis day.  She lives at the [facility] assisted living.  Objective: Vitals:   09/03/19 1315 09/03/19 1330  BP: (!) 192/71 (!) 175/77  Pulse: (!) 57 (!) 57  Resp: 12 11  Temp:    SpO2:      Intake/Output Summary (Last 24 hours) at 09/03/2019 1414 Last data filed at 09/03/2019 0950 Gross per 24 hour  Intake 480 ml  Output --  Net 480 ml   Filed Weights   09/02/19 1044 09/03/19 0157 09/03/19 1150  Weight: 57.2 kg 56.2 kg 57.6 kg    ROS: Review of Systems  Constitutional: Negative for fever.  Eyes: Negative for blurred vision.  Respiratory: Negative for cough and shortness of breath.   Cardiovascular: Negative for chest pain.  Gastrointestinal: Negative for abdominal pain, nausea and vomiting.  Genitourinary: Negative for dysuria.  Musculoskeletal: Negative for joint pain.  Neurological: Negative for dizziness.   Exam: Physical Exam  Constitutional: She is oriented to person, place, and time.  HENT:  Nose: No mucosal edema.  Mouth/Throat: No oropharyngeal exudate or posterior oropharyngeal edema.  Eyes: Pupils are equal, round, and reactive to light. Conjunctivae, EOM and lids are normal.  Neck: No JVD present. Carotid bruit is not present. No thyroid mass and no thyromegaly present.  Cardiovascular: S1 normal and S2 normal. Exam reveals no gallop.  No murmur heard. Respiratory: No respiratory distress. She has no wheezes. She has no rhonchi. She has no rales.  GI: Soft. Bowel sounds are normal. There is no abdominal tenderness.  Musculoskeletal:     Cervical back: No edema.     Right knee: No  swelling.     Left knee: No swelling.  Lymphadenopathy:    She has no cervical adenopathy.  Neurological: She is alert and oriented to person, place, and time. No cranial nerve deficit.  Skin: Skin is warm. No rash noted. Nails show no clubbing.  Psychiatric: She has a normal mood and affect.      Data Reviewed: Basic Metabolic Panel: Recent Labs  Lab 09/02/19 1046 09/03/19 0039  NA 136 139  K 4.8 4.2  CL 93* 94*  CO2 31 31  GLUCOSE 247* 200*  BUN 31* 42*  CREATININE 5.35* 6.19*  CALCIUM 8.3* 8.2*  PHOS  --  5.6*   Liver Function Tests: Recent Labs  Lab 09/02/19 1046 09/03/19 0039  AST 19 18  ALT 32 30  ALKPHOS 179* 180*  BILITOT 0.5 0.5  PROT 7.1 7.3  ALBUMIN 3.5 3.5   CBC: Recent Labs  Lab 09/02/19 1046 09/03/19 0039  WBC 5.4 6.9  NEUTROABS 3.8  --   HGB 9.9* 10.1*  HCT 32.3* 32.1*  MCV 98.8 97.0  PLT 107* 111*   BNP (last 3 results) Recent Labs    09/02/19 1555  BNP >4,500.0*    CBG: Recent Labs  Lab 09/02/19 1046 09/03/19 0041 09/03/19 0828  GLUCAP 234* 190* 147*    Recent Results (from the past 240 hour(s))  SARS Coronavirus 2 by RT PCR (hospital order, performed in [place] hospital lab) Nasopharyngeal Nasopharyngeal Swab     Status: None   Collection Time:  09/02/19  3:55 PM   Specimen: Nasopharyngeal Swab  Result Value Ref Range Status   SARS Coronavirus 2 NEGATIVE NEGATIVE Final    Comment: (NOTE) SARS-CoV-2 target nucleic acids are NOT DETECTED. The SARS-CoV-2 RNA is generally detectable in upper and lower respiratory specimens during the acute phase of infection. The lowest concentration of SARS-CoV-2 viral copies this assay can detect is 250 copies / mL. A negative result does not preclude SARS-CoV-2 infection and should not be used as the sole basis for treatment or other patient management decisions.  A negative result may occur with improper specimen collection / handling, submission of specimen other than  nasopharyngeal swab, presence of viral mutation(s) within the areas targeted by this assay, and inadequate number of viral copies (<250 copies / mL). A negative result must be combined with clinical observations, patient history, and epidemiological information. Fact Sheet for Patients:   StrictlyIdeas.no Fact Sheet for Healthcare Providers: BankingDealers.co.za This test is not yet approved or cleared  by the [place] FDA and has been authorized for detection and/or diagnosis of SARS-CoV-2 by FDA under an Emergency Use Authorization (EUA).  This EUA will remain in effect (meaning this test can be used) for the duration of the COVID-19 declaration under Section 564(b)(1) of the Act, 21 U.S.C. section 360bbb-3(b)(1), unless the authorization is terminated or revoked sooner. Performed at [facility], [address] [place], [place] [address]   MRSA PCR Screening     Status: None   Collection Time: 09/03/19  3:11 AM   Specimen: Nasal Mucosa; Nasopharyngeal  Result Value Ref Range Status   MRSA by PCR NEGATIVE NEGATIVE Final    Comment:        The GeneXpert MRSA Assay (FDA approved for NASAL specimens only), is one component of a comprehensive MRSA colonization surveillance program. It is not intended to diagnose MRSA infection nor to guide or monitor treatment for MRSA infections. Performed at [facility], [address] [place], [place] [address]      Studies: DG Chest 2 View  Result Date: 09/02/2019 CLINICAL DATA:  Hypoxia EXAM: CHEST - 2 VIEW COMPARISON:  May 01, 2019 FINDINGS: There are small pleural effusions bilaterally with patchy airspace opacity in the periphery of each lung base. Mild atelectatic changes noted in each mid lung. Lungs elsewhere are clear. Heart is enlarged with pulmonary vascularity normal. Patient is status post coronary artery bypass grafting. There is aortic atherosclerosis.  Central catheter tip is in the right atrium slightly beyond the cavoatrial junction. No pneumothorax. No bone lesions. IMPRESSION: Small pleural effusions bilaterally with ill-defined airspace opacity in the periphery of each lower lobe, concerning for foci of pneumonia. Areas of mild atelectatic change also present. Stable cardiomegaly with pulmonary vascularity normal. Postoperative changes noted. Central catheter tip in right atrium. Aortic Atherosclerosis (ICD10-I70.0). Electronically Signed   By: Lowella Grip III M.D.   On: 09/02/2019 11:59   CT CHEST WO CONTRAST  Result Date: 09/02/2019 CLINICAL DATA:  Patient woke up not feeling well. Low oxygen saturations. EXAM: CT CHEST WITHOUT CONTRAST TECHNIQUE: Multidetector CT imaging of the chest was performed following the standard protocol without IV contrast. COMPARISON:  02/12/2018 FINDINGS: Cardiovascular: Heart is enlarged. There is dense atherosclerotic calcification of the coronary arteries. LEFT IJ central line tip is in the RIGHT atrium. There is no pericardial effusion. There is dense atherosclerotic calcification of the thoracic aorta, not associated with aneurysm. Mediastinum/Nodes: There is mild thickening of the esophageal wall consistent with esophagitis. There  are numerous small mediastinal lymph nodes, not reaching grade 2 for pathologic enlargement and possibly reactive. Lungs/Pleura: Small bilateral pleural effusions. Pleural calcifications are stable. There is bibasilar atelectasis. There is septal thickening and mild dependent airspace filling of RIGHT greater than LEFT. Findings are consistent mild pulmonary edema. Upper Abdomen: Calcifications are identified within the liver and spleen consistent with prior granulomatous disease. Punctate calcifications within the UPPER poles of the kidneys may represent intrarenal calculi or vascular calcifications. Musculoskeletal: No chest wall mass or suspicious bone lesions identified. Median  sternotomy. IMPRESSION: 1. Cardiomegaly, small bilateral pleural effusions, and mild pulmonary edema. 2. Coronary artery disease. 3. Mild thickening of the esophageal wall consistent with esophagitis. 4. Aortic Atherosclerosis (ICD10-I70.0). Electronically Signed   By: Nolon Nations M.D.   On: 09/02/2019 18:03    Scheduled Meds: . apixaban  2.5 mg Oral BID  . aspirin EC  81 mg Oral Daily  . atorvastatin  80 mg Oral QHS  . brimonidine  1 drop Both Eyes TID  . carvedilol  25 mg Oral BID  . Chlorhexidine Gluconate Cloth  6 each Topical Q0600  . fluticasone  2 spray Each Nare Daily  . insulin aspart  0-6 Units Subcutaneous TID WC  . insulin glargine  5 Units Subcutaneous QHS  . isosorbide mononitrate  30 mg Oral Daily  . levETIRAcetam  250 mg Oral Q M,W,F-HD  . mouth rinse  15 mL Mouth Rinse BID  . mirtazapine  7.5 mg Oral Daily  . omega-3 acid ethyl esters  2 capsule Oral Daily  . pantoprazole  40 mg Oral Daily  . polyvinyl alcohol  1 drop Both Eyes BID  . rOPINIRole  0.25 mg Oral QHS  . sevelamer carbonate  800 mg Oral TID WC  . sodium chloride flush  3 mL Intravenous Once  . timolol  1 drop Both Eyes TID  . vitamin B-12  1,000 mcg Oral Daily   Continuous Infusions: . sodium chloride    . sodium chloride      Assessment/Plan:  1. Chest pain and shortness of breath and slightly elevated troponin which was flat.  Symptoms have gone away.  Continue to monitor. 2. End-stage renal disease.  For dialysis today. 3. Accelerated hypertension on Coreg, Imdur.  Patient has numerous drug allergies and I may have to add clonidine if blood pressure still elevated. 4. Peripheral vascular disease on Eliquis 5. Hyperlipidemia unspecified on atorvastatin 6. History of seizure on Keppra 7. Chronic systolic congestive heart failure.  Dialysis to remove fluid.  Patient does not urinate.  Patient has allergies to ACE inhibitors and ARB's.    Code Status:     Code Status Orders  (From  admission, onward)         Start     Ordered   09/02/19 1740  Full code  Continuous     09/02/19 1741        Code Status History    Date Active Date Inactive Code Status Order ID Comments User Context   05/02/2019 0516 05/04/2019 1936 DNR [id number]  Neila Gear, NP Inpatient   05/02/2019 0101 05/02/2019 0516 Full Code [id number]  Jennye Boroughs, MD ED   07/08/2018 1509 07/13/2018 2019 Partial Code [id number]  Loletha Grayer, MD ED   05/06/2018 1738 05/12/2018 2235 Partial Code [id number]  Farley Ly, RN Inpatient   05/02/2018 2122 05/06/2018 1738 DNR [id number]  Dustin Flock, MD ED   04/13/2018 1448 04/16/2018 2342 DNR [id number]  Fritzi Mandes, MD  Inpatient   03/19/2018 1215 03/23/2018 1906 DNR [id number]  Fritzi Mandes, MD Inpatient   03/14/2018 2310 03/18/2018 2018 DNR [id number]  Erlene Quan, NP Inpatient   03/10/2018 0837 03/14/2018 2310 Full Code [id number]  Gladstone Lighter, MD Inpatient   02/18/2018 1000 02/28/2018 2122 Full Code [id number]  Loletha Grayer, MD ED   02/01/2018 0412 02/15/2018 2250 Full Code [id number]  Arta Silence, MD Inpatient   01/31/2018 2300 02/01/2018 0412 DNR [id number]  Amelia Jo, MD Inpatient   01/31/2018 2037 01/31/2018 2300 DNR [id number]  Awilda Bill, NP Inpatient   01/29/2018 2250 01/31/2018 2037 Full Code [id number]  Amelia Jo, MD Inpatient   10/24/2016 1055 10/25/2016 2243 Full Code [id number]  Hillary Bow, MD ED   10/17/2016 2143 10/19/2016 1440 Full Code [id number]  Vaughan Basta, MD Inpatient   09/04/2016 1708 09/05/2016 1500 Full Code [id number]  Epifanio Lesches, MD ED   08/22/2016 0938 08/22/2016 1833 Full Code [id number]  Algernon Huxley, MD Inpatient   06/27/2016 1121 06/27/2016 1846 Full Code [id number]  Algernon Huxley, MD Inpatient   10/03/2015 2235 10/04/2015 1724 Full Code [id number]  Harrie Foreman, MD Inpatient   Advance Care Planning Activity     Family Communication: Left message for daughter Disposition Plan:  Status is: Inpatient   Dispo: The patient is from: Assisted living              Anticipated d/c is to: Assisted living              Anticipated d/c date is: 09/04/2019              Patient currently has accelerated hypertension.  Will reassess blood pressure after dialysis today and add medications as indicated.  Hopefully will have better blood pressure control by tomorrow.  Consultants:  Nephrology  Time spent: 28 minutes  [place]

## 2019-09-04 ENCOUNTER — Inpatient Hospital Stay: Payer: Medicare Other

## 2019-09-04 DIAGNOSIS — E1169 Type 2 diabetes mellitus with other specified complication: Secondary | ICD-10-CM

## 2019-09-04 DIAGNOSIS — R1900 Intra-abdominal and pelvic swelling, mass and lump, unspecified site: Secondary | ICD-10-CM

## 2019-09-04 DIAGNOSIS — E785 Hyperlipidemia, unspecified: Secondary | ICD-10-CM

## 2019-09-04 DIAGNOSIS — I5043 Acute on chronic combined systolic (congestive) and diastolic (congestive) heart failure: Secondary | ICD-10-CM

## 2019-09-04 LAB — RENAL FUNCTION PANEL
Albumin: 3.4 g/dL — ABNORMAL LOW (ref 3.5–5.0)
Anion gap: 12 (ref 5–15)
BUN: 44 mg/dL — ABNORMAL HIGH (ref 8–23)
CO2: 29 mmol/L (ref 22–32)
Calcium: 8.6 mg/dL — ABNORMAL LOW (ref 8.9–10.3)
Chloride: 96 mmol/L — ABNORMAL LOW (ref 98–111)
Creatinine, Ser: 6.82 mg/dL — ABNORMAL HIGH (ref 0.44–1.00)
GFR calc Af Amer: 7 mL/min — ABNORMAL LOW (ref >60)
GFR calc non Af Amer: 6 mL/min — ABNORMAL LOW (ref >60)
Glucose, Bld: 127 mg/dL — ABNORMAL HIGH (ref 70–99)
Phosphorus: 5.8 mg/dL — ABNORMAL HIGH (ref 2.5–4.6)
Potassium: 4.9 mmol/L (ref 3.5–5.1)
Sodium: 137 mmol/L (ref 135–145)

## 2019-09-04 LAB — CBC
HCT: 30.2 % — ABNORMAL LOW (ref 36.0–46.0)
Hemoglobin: 9.3 g/dL — ABNORMAL LOW (ref 12.0–15.0)
MCH: 30.4 pg (ref 26.0–34.0)
MCHC: 30.8 g/dL (ref 30.0–36.0)
MCV: 98.7 fL (ref 80.0–100.0)
Platelets: 101 10*3/uL — ABNORMAL LOW (ref 150–400)
RBC: 3.06 MIL/uL — ABNORMAL LOW (ref 3.87–5.11)
RDW: 17.2 % — ABNORMAL HIGH (ref 11.5–15.5)
WBC: 6.2 10*3/uL (ref 4.0–10.5)
nRBC: 0 % (ref 0.0–0.2)

## 2019-09-04 LAB — GLUCOSE, CAPILLARY
Glucose-Capillary: 105 mg/dL — ABNORMAL HIGH (ref 70–99)
Glucose-Capillary: 85 mg/dL (ref 70–99)
Glucose-Capillary: 170 mg/dL — ABNORMAL HIGH (ref 70–99)
Glucose-Capillary: 178 mg/dL — ABNORMAL HIGH (ref 70–99)

## 2019-09-04 LAB — PARATHYROID HORMONE, INTACT (NO CA): PTH: 385 pg/mL — ABNORMAL HIGH (ref 15–65)

## 2019-09-04 MED ORDER — LACTULOSE 10 GM/15ML PO SOLN: 30.0000 g | Freq: Once | ORAL | Status: DC

## 2019-09-04 MED ORDER — IPRATROPIUM BROMIDE 0.06 % NA SOLN
2.0000 | Freq: Two times a day (BID) | NASAL | Status: DC
  Administered 2019-09-04 – 2019-09-05 (×4): 2 via NASAL
  Filled 2019-09-04: qty 15

## 2019-09-04 MED ORDER — DIPHENHYDRAMINE HCL 25 MG PO CAPS
25.0000 mg | ORAL_CAPSULE | Freq: Four times a day (QID) | ORAL | Status: DC | PRN
  Administered 2019-09-04: 25 mg via ORAL
  Filled 2019-09-04: qty 1

## 2019-09-04 MED ORDER — HYDRALAZINE HCL 25 MG PO TABS
25.0000 mg | ORAL_TABLET | Freq: Three times a day (TID) | ORAL | Status: DC
  Administered 2019-09-04: 25 mg via ORAL
  Filled 2019-09-04: qty 1

## 2019-09-04 NOTE — Progress Notes (Signed)
HD Initiated     09/03/19 1150  Vital Signs  Temp 97.8 F (36.6 C)  Temp Source Oral  Pulse Rate 60  Pulse Rate Source Dinamap  Resp 10  BP (!) 134/54  BP Location Right Arm  BP Method Automatic  Patient Position (if appropriate) Lying  Oxygen Therapy  SpO2 99 %  O2 Device Nasal Cannula  O2 Flow Rate (L/min) 2 L/min  FiO2 (%) 98 %  Patient Activity (if Appropriate) In bed  Pain Assessment  Pain Scale 0-10  Pain Score 0  Machine Checks  Machine Number 4  Station Number [id number]  UF/Alarm Test Passed  Conductivity: Meter 14  Conductivity: Machine  14.2  pH 7  Normal Saline Lot Number [id number]  Dialyzer Lot Number [id number]  Disposable Set Lot Number [id number]  Dialysate Acid Bath Lot Number [id number]  Dialysate HCO3 Bath Lot Number [id number]  Machine Temperature 98.6 F (37 C)  Musician and Audible Yes  Blood Lines Intact and Secured Yes  Pre Treatment Patient Checks  Vascular access used during treatment Catheter  Patient is receiving dialysis in a chair Yes  Hepatitis B Surface Antigen Results Negative  Hemodialysis Consent Verified Yes  Hemodialysis Standing Orders Initiated Yes  ECG (Telemetry) Monitor On Yes  Prime Ordered Normal Saline  Length of  DialysisTreatment -hour(s) 3.5 Hour(s)  Dialysis Treatment Comments Treatment  started  Dialyzer Elisio 17H NR  Dialysate 2.25 Ca  Dialysis Anticoagulant None  Blood Flow Rate Ordered 350 mL/min  Ultrafiltration Goal 2500 Liters  Dialysis Blood Pressure Support Ordered Normal Saline  Education / Care Plan  Dialysis Education Provided Yes  Documented Education in Care Plan Yes  Outpatient Plan of Care Reviewed and on Chart Yes

## 2019-09-04 NOTE — Progress Notes (Signed)
Physical Therapy Evaluation Patient Details Name: Stephanie Ellis MRN: [id number] DOB: 1950-08-03 Today's Date: 09/04/2019   History of Present Illness  Per MD note:Stephanie Ellis is a 69 y.o. female with medical history significant for end-stage renal disease on hemodialysis management (dialysis days Monday Wednesday and Friday) history of coronary artery disease status post CABG, peripheral arterial disease and history of CHF.  Patient was brought into the emergency room from the nursing home for evaluation of chest tightness, shortness of breath and elevated blood pressure.  Per EMS her pulse oximetry was in the 70s on room air and she is currently on 2 L of oxygen with improvement in her pulse oximetry to 90's .  Patient went to dialysis yesterday and completed 4 hours of treatment.  Her chest tightness is associated with nausea and vomiting but she denies having palpitations or diaphoresis.  Clinical Impression  Patient agrees to PT evaluation. She has 3+/5 strength B hip flex and abd. She is independent with bed mobility and has good sitting balance. She was educated in knee extension to avoid knee flex contractures. She was educated in BLE hip abd exercise in sidelying with correct technique, and quad sets with correct technique. Patient does not have her prosthetic LE's and has not been wearing them due to BP problems. She will benefit from skilled PT to improve her mobility and strength.     Follow Up Recommendations SNF    Equipment Recommendations  None recommended by PT    Recommendations for Other Services       Precautions / Restrictions Restrictions Weight Bearing Restrictions: No      Mobility  Bed Mobility Overal bed mobility: Independent                Transfers Overall transfer level: (unable due to not using prosthetics)               General transfer comment: (she reports scooting to wc at SNF without sliding board)  Ambulation/Gait                Stairs            Wheelchair Mobility    Modified Rankin (Stroke Patients Only)       Balance                                             Pertinent Vitals/Pain Pain Assessment: No/denies pain    Home Living Family/patient expects to be discharged to:: Skilled nursing facility                      Prior Function Level of Independence: Independent with assistive device(s)         Comments: (non-ambulatory the last 2 weeks)     Hand Dominance        Extremity/Trunk Assessment   Upper Extremity Assessment Upper Extremity Assessment: Overall WFL for tasks assessed    Lower Extremity Assessment Lower Extremity Assessment: RLE deficits/detail;LLE deficits/detail RLE Deficits / Details: (Hip abd 3+/5 stump) RLE Coordination: (Hip abd 3+/5 stump)       Communication   Communication: No difficulties  Cognition Arousal/Alertness: Awake/alert Behavior During Therapy: WFL for tasks assessed/performed Overall Cognitive Status: Within Functional Limits for tasks assessed  General Comments      Exercises     Assessment/Plan    PT Assessment Patient needs continued PT services  PT Problem List Decreased strength;Decreased activity tolerance;Decreased mobility       PT Treatment Interventions Gait training;Therapeutic activities;Therapeutic exercise;Balance training    PT Goals (Current goals can be found in the Care Plan section)  Acute Rehab PT Goals Patient Stated Goal: to walk PT Goal Formulation: With patient Time For Goal Achievement: 09/18/19 Potential to Achieve Goals: Good    Frequency Min 2X/week   Barriers to discharge        Co-evaluation               AM-PAC PT "6 Clicks" Mobility  Outcome Measure Help needed turning from your back to your side while in a flat bed without using bedrails?: None Help needed moving from lying on your back to  sitting on the side of a flat bed without using bedrails?: None Help needed moving to and from a bed to a chair (including a wheelchair)?: Total Help needed standing up from a chair using your arms (e.g., wheelchair or bedside chair)?: Total Help needed to walk in hospital room?: Total Help needed climbing 3-5 steps with a railing? : Total 6 Click Score: 12    End of Session Equipment Utilized During Treatment: Oxygen Activity Tolerance: Patient tolerated treatment well Patient left: with bed alarm set Nurse Communication: Mobility status PT Visit Diagnosis: Muscle weakness (generalized) (M62.81);Difficulty in walking, not elsewhere classified (R26.2)    Time: 1587-2761 PT Time Calculation (min) (ACUTE ONLY): 25 min   Charges:   PT Evaluation $PT Eval Low Complexity: 1 Low PT Treatments $Therapeutic Exercise: 8-22 mins         Alanson Puls, PT DPT 09/04/2019, 1:14 PM

## 2019-09-04 NOTE — Progress Notes (Signed)
Patient ID: Stephanie Ellis, female   DOB: 01-24-1951, 69 y.o.   MRN: [id number] [facility] PROGRESS NOTE  Stephanie Ellis JSE:[id number] DOB: [birth year] DOA: 09/02/2019 PCP: Kirk Ruths, MD  HPI/Subjective: Patient complains of some abdominal fullness.  Patient feels bloated.  No complaints of chest pain.  Objective: Vitals:   09/04/19 1545 09/04/19 1600  BP: (!) 162/127 (!) 167/132  Pulse: (!) 59 (!) 58  Resp: 19 14  Temp:    SpO2: 100% 100%    Intake/Output Summary (Last 24 hours) at 09/04/2019 1626 Last data filed at 09/04/2019 1157 Gross per 24 hour  Intake 240 ml  Output 0 ml  Net 240 ml   Filed Weights   09/03/19 0157 09/03/19 1150 09/04/19 0343  Weight: 56.2 kg 57.6 kg 56.9 kg    ROS: Review of Systems  Constitutional: Negative for fever.  Eyes: Negative for blurred vision.  Respiratory: Negative for cough and shortness of breath.   Cardiovascular: Negative for chest pain.  Gastrointestinal: Positive for abdominal pain. Negative for nausea and vomiting.  Genitourinary: Negative for dysuria.  Musculoskeletal: Negative for joint pain.  Neurological: Negative for dizziness.   Exam: Physical Exam  Constitutional: She is oriented to person, place, and time.  HENT:  Nose: No mucosal edema.  Mouth/Throat: No oropharyngeal exudate or posterior oropharyngeal edema.  Eyes: Pupils are equal, round, and reactive to light. Conjunctivae, EOM and lids are normal.  Neck: No JVD present. Carotid bruit is not present. No thyroid mass and no thyromegaly present.  Cardiovascular: S1 normal and S2 normal. Exam reveals no gallop.  No murmur heard. Respiratory: No respiratory distress. She has decreased breath sounds in the right lower field and the left lower field. She has no wheezes. She has no rhonchi. She has no rales.  GI: Soft. Bowel sounds are normal. She exhibits distension. There is abdominal tenderness.  Musculoskeletal:     Cervical back: No edema.   Right knee: No swelling.     Left knee: No swelling.  Lymphadenopathy:    She has no cervical adenopathy.  Neurological: She is alert and oriented to person, place, and time. No cranial nerve deficit.  Skin: Skin is warm. No rash noted. Nails show no clubbing.  Psychiatric: She has a normal mood and affect.      Data Reviewed: Basic Metabolic Panel: Recent Labs  Lab 09/02/19 1046 09/03/19 0039  NA 136 139  K 4.8 4.2  CL 93* 94*  CO2 31 31  GLUCOSE 247* 200*  BUN 31* 42*  CREATININE 5.35* 6.19*  CALCIUM 8.3* 8.2*  PHOS  --  5.6*   Liver Function Tests: Recent Labs  Lab 09/02/19 1046 09/03/19 0039  AST 19 18  ALT 32 30  ALKPHOS 179* 180*  BILITOT 0.5 0.5  PROT 7.1 7.3  ALBUMIN 3.5 3.5   CBC: Recent Labs  Lab 09/02/19 1046 09/03/19 0039  WBC 5.4 6.9  NEUTROABS 3.8  --   HGB 9.9* 10.1*  HCT 32.3* 32.1*  MCV 98.8 97.0  PLT 107* 111*   BNP (last 3 results) Recent Labs    09/02/19 1555  BNP >4,500.0*    CBG: Recent Labs  Lab 09/03/19 1707 09/03/19 2059 09/04/19 0343 09/04/19 0801 09/04/19 1152  GLUCAP 207* 258* 105* 85 170*    Recent Results (from the past 240 hour(s))  SARS Coronavirus 2 by RT PCR (hospital order, performed in [facility] hospital lab) Nasopharyngeal Nasopharyngeal Swab     Status: None   Collection  Time: 09/02/19  3:55 PM   Specimen: Nasopharyngeal Swab  Result Value Ref Range Status   SARS Coronavirus 2 NEGATIVE NEGATIVE Final    Comment: (NOTE) SARS-CoV-2 target nucleic acids are NOT DETECTED. The SARS-CoV-2 RNA is generally detectable in upper and lower respiratory specimens during the acute phase of infection. The lowest concentration of SARS-CoV-2 viral copies this assay can detect is 250 copies / mL. A negative result does not preclude SARS-CoV-2 infection and should not be used as the sole basis for treatment or other patient management decisions.  A negative result may occur with improper specimen collection /  handling, submission of specimen other than nasopharyngeal swab, presence of viral mutation(s) within the areas targeted by this assay, and inadequate number of viral copies (<250 copies / mL). A negative result must be combined with clinical observations, patient history, and epidemiological information. Fact Sheet for Patients:   StrictlyIdeas.no Fact Sheet for Healthcare Providers: BankingDealers.co.za This test is not yet approved or cleared  by the [place] FDA and has been authorized for detection and/or diagnosis of SARS-CoV-2 by FDA under an Emergency Use Authorization (EUA).  This EUA will remain in effect (meaning this test can be used) for the duration of the COVID-19 declaration under Section 564(b)(1) of the Act, 21 U.S.C. section 360bbb-3(b)(1), unless the authorization is terminated or revoked sooner. Performed at [facility], [address] [place], [place] [address]   MRSA PCR Screening     Status: None   Collection Time: 09/03/19  3:11 AM   Specimen: Nasal Mucosa; Nasopharyngeal  Result Value Ref Range Status   MRSA by PCR NEGATIVE NEGATIVE Final    Comment:        The GeneXpert MRSA Assay (FDA approved for NASAL specimens only), is one component of a comprehensive MRSA colonization surveillance program. It is not intended to diagnose MRSA infection nor to guide or monitor treatment for MRSA infections. Performed at [facility], [address] [place], [place] [address]      Studies: CT CHEST WO CONTRAST  Result Date: 09/02/2019 CLINICAL DATA:  Patient woke up not feeling well. Low oxygen saturations. EXAM: CT CHEST WITHOUT CONTRAST TECHNIQUE: Multidetector CT imaging of the chest was performed following the standard protocol without IV contrast. COMPARISON:  02/12/2018 FINDINGS: Cardiovascular: Heart is enlarged. There is dense atherosclerotic calcification of the coronary arteries.  LEFT IJ central line tip is in the RIGHT atrium. There is no pericardial effusion. There is dense atherosclerotic calcification of the thoracic aorta, not associated with aneurysm. Mediastinum/Nodes: There is mild thickening of the esophageal wall consistent with esophagitis. There are numerous small mediastinal lymph nodes, not reaching grade 2 for pathologic enlargement and possibly reactive. Lungs/Pleura: Small bilateral pleural effusions. Pleural calcifications are stable. There is bibasilar atelectasis. There is septal thickening and mild dependent airspace filling of RIGHT greater than LEFT. Findings are consistent mild pulmonary edema. Upper Abdomen: Calcifications are identified within the liver and spleen consistent with prior granulomatous disease. Punctate calcifications within the UPPER poles of the kidneys may represent intrarenal calculi or vascular calcifications. Musculoskeletal: No chest wall mass or suspicious bone lesions identified. Median sternotomy. IMPRESSION: 1. Cardiomegaly, small bilateral pleural effusions, and mild pulmonary edema. 2. Coronary artery disease. 3. Mild thickening of the esophageal wall consistent with esophagitis. 4. Aortic Atherosclerosis (ICD10-I70.0). Electronically Signed   By: Nolon Nations M.D.   On: 09/02/2019 18:03   US Abdomen Limited RUQ  Result Date: 09/04/2019 CLINICAL DATA:  Abdominal swelling, history  of renal failure EXAM: ULTRASOUND ABDOMEN LIMITED RIGHT UPPER QUADRANT COMPARISON:  Ultrasound 05/01/2019, CT 05/01/2019 FINDINGS: Gallbladder: Gallbladder is slightly under distended. There is edematous mural thickening which is similar to comparison ultrasound as well as a mixture of echogenic stones and sludge within the gallbladder tip. Sonographic Percell Miller sign is reportedly negative. Common bile duct: Diameter: 1.6 mm, nondilated Liver: No focal lesion identified. Within normal limits in parenchymal echogenicity. Portal vein is patent on color Doppler  imaging with normal direction of blood flow towards the liver. Other: Small volume ascites and right pleural effusion. IMPRESSION: Cholelithiasis and biliary sludge with mild edematous thickening of the gallbladder wall which was present on multiple prior exams. Sonographic Percell Miller sign is negative which would be atypical in the setting of acute cholecystitis. Findings are somewhat nonspecific, particularly given the presence of ascites and chronicity of this feature. If there is persisting clinical concern HIDA could be obtained. Additional note made of small volume ascites and a right pleural effusion. Electronically Signed   By: Lovena Le M.D.   On: 09/04/2019 15:27    Scheduled Meds: . apixaban  2.5 mg Oral BID  . aspirin EC  81 mg Oral Daily  . atorvastatin  80 mg Oral QHS  . brimonidine  1 drop Both Eyes TID  . carvedilol  25 mg Oral BID  . Chlorhexidine Gluconate Cloth  6 each Topical Q0600  . fluticasone  2 spray Each Nare Daily  . insulin aspart  0-6 Units Subcutaneous TID WC  . insulin glargine  5 Units Subcutaneous QHS  . ipratropium  2 spray Each Nare BID  . isosorbide mononitrate  30 mg Oral Daily  . lactulose  30 g Oral Once  . levETIRAcetam  250 mg Oral Q M,W,F-HD  . mouth rinse  15 mL Mouth Rinse BID  . mirtazapine  7.5 mg Oral Daily  . omega-3 acid ethyl esters  2 capsule Oral Daily  . pantoprazole  40 mg Oral Daily  . polyvinyl alcohol  1 drop Both Eyes BID  . rOPINIRole  0.25 mg Oral QHS  . sevelamer carbonate  800 mg Oral TID WC  . sodium chloride flush  3 mL Intravenous Once  . timolol  1 drop Both Eyes TID  . vitamin B-12  1,000 mcg Oral Daily   Continuous Infusions: . sodium chloride    . sodium chloride      Assessment/Plan:  1. Chest pain and shortness of breath and slightly elevated troponin which was flat.  Patient states that is now moved down into her upper abdomen.  Nephrology to do an extra dialysis session today.  Reassess tomorrow.  Ultrasound of  the abdomen shows some chronic changes about the gallbladder and only small ascites. 2. End-stage renal disease.  For extra dialysis session today. 3. Accelerated hypertension on Coreg, Imdur.  Patient has numerous drug allergies.  I added hydralazine this morning but nephrology states that she has syncope with low blood pressures at dialysis.  So hydralazine was discontinued.  As needed clonidine for high blood pressure. 4. Peripheral vascular disease on Eliquis 5. Type 2 diabetes mellitus with hyperlipidemia unspecified on atorvastatin.  Patient on low-dose Lantus. 6. History of seizure on Keppra 7. Chronic systolic congestive heart failure.  Dialysis to remove fluid.  Patient does not urinate.  Patient has allergies to ACE inhibitors and ARB's.     Code Status:     Code Status Orders  (From admission, onward)  Start     Ordered   09/02/19 1740  Full code  Continuous     09/02/19 1741        Code Status History    Date Active Date Inactive Code Status Order ID Comments User Context   05/02/2019 0516 05/04/2019 1936 DNR [id number]  Neila Gear, NP Inpatient   05/02/2019 0101 05/02/2019 0516 Full Code [id number]  Jennye Boroughs, MD ED   07/08/2018 1509 07/13/2018 2019 Partial Code [id number]  Loletha Grayer, MD ED   05/06/2018 1738 05/12/2018 2235 Partial Code [id number]  Farley Ly, RN Inpatient   05/02/2018 2122 05/06/2018 1738 DNR [id number]  Dustin Flock, MD ED   04/13/2018 1448 04/16/2018 2342 DNR [id number]  Fritzi Mandes, MD Inpatient   03/19/2018 1215 03/23/2018 1906 DNR [id number]  Fritzi Mandes, MD Inpatient   03/14/2018 2310 03/18/2018 2018 DNR [id number]  Erlene Quan, NP Inpatient   03/10/2018 0837 03/14/2018 2310 Full Code [id number]  Gladstone Lighter, MD Inpatient   02/18/2018 1000 02/28/2018 2122 Full Code [id number]  Loletha Grayer, MD ED   02/01/2018 0412 02/15/2018 2250 Full Code [id number]  Arta Silence, MD Inpatient   01/31/2018 2300  02/01/2018 0412 DNR [id number]  Amelia Jo, MD Inpatient   01/31/2018 2037 01/31/2018 2300 DNR [id number]  Awilda Bill, NP Inpatient   01/29/2018 2250 01/31/2018 2037 Full Code [id number]  Amelia Jo, MD Inpatient   10/24/2016 1055 10/25/2016 2243 Full Code [id number]  Hillary Bow, MD ED   10/17/2016 2143 10/19/2016 1440 Full Code [id number]  Vaughan Basta, MD Inpatient   09/04/2016 1708 09/05/2016 1500 Full Code [id number]  Epifanio Lesches, MD ED   08/22/2016 0938 08/22/2016 1833 Full Code [id number]  Algernon Huxley, MD Inpatient   06/27/2016 1121 06/27/2016 1846 Full Code [id number]  Algernon Huxley, MD Inpatient   10/03/2015 2235 10/04/2015 1724 Full Code [id number]  Harrie Foreman, MD Inpatient   Advance Care Planning Activity     Disposition Plan: Status is: Inpatient   Dispo: The patient is from: Assisted living              Anticipated d/c is to: Assisted living              Anticipated d/c date is: 09/05/2019              Patient currently requiring an extra dialysis session today.  Will reassess on how she feels tomorrow for potential disposition.  Consultants:  Nephrology  Time spent: 27 minutes, case discussed with nephrology  [place]

## 2019-09-04 NOTE — Progress Notes (Signed)
Pre HD     09/03/19 1150  Vital Signs  Temp 97.8 F (36.6 C)  Temp Source Oral  Pulse Rate 60  Pulse Rate Source Dinamap  Resp 10  BP (!) 134/54  BP Location Right Arm  BP Method Automatic  Patient Position (if appropriate) Lying  Oxygen Therapy  SpO2 99 %  O2 Device Nasal Cannula  O2 Flow Rate (L/min) 2 L/min  FiO2 (%) 98 %  Patient Activity (if Appropriate) In bed  Pain Assessment  Pain Scale 0-10  Pain Score 0  Machine Checks  Machine Number 4  Station Number [id number]  UF/Alarm Test Passed  Conductivity: Meter 14  Conductivity: Machine  14.2  pH 7  Normal Saline Lot Number [id number]  Dialyzer Lot Number [id number]  Disposable Set Lot Number [id number]  Dialysate Acid Bath Lot Number [id number]  Dialysate HCO3 Bath Lot Number [id number]  Machine Temperature 98.6 F (37 C)  Musician and Audible Yes  Blood Lines Intact and Secured Yes  Pre Treatment Patient Checks  Vascular access used during treatment Catheter  Patient is receiving dialysis in a chair Yes  Hepatitis B Surface Antigen Results Negative  Hemodialysis Consent Verified Yes  Hemodialysis Standing Orders Initiated Yes  ECG (Telemetry) Monitor On Yes  Prime Ordered Normal Saline  Length of  DialysisTreatment -hour(s) 3.5 Hour(s)  Dialysis Treatment Comments Treatment  started  Dialyzer Elisio 17H NR  Dialysate 2.25 Ca  Dialysis Anticoagulant None  Blood Flow Rate Ordered 350 mL/min  Ultrafiltration Goal 2500 Liters  Dialysis Blood Pressure Support Ordered Normal Saline  Education / Care Plan  Dialysis Education Provided Yes  Documented Education in Care Plan Yes  Outpatient Plan of Care Reviewed and on Chart Yes

## 2019-09-04 NOTE — Progress Notes (Signed)
[facility]    SUBJECTIVE: Patient feels reasonably well denies any pain no weakness adequate appetite no shortness of breath status post dialysis resting comfortably   Vitals:   09/03/19 1605 09/03/19 1936 09/03/19 2353 09/04/19 0343  BP: (!) 175/57 (!) 181/67 (!) 147/54 (!) 170/67  Pulse: 60 64 (!) 55 (!) 57  Resp: 19 19  19   Temp: 98 F (36.7 C) 98.5 F (36.9 C)  97.9 F (36.6 C)  TempSrc: Oral Oral  Oral  SpO2: 98% 100% 95% 100%  Weight:    56.9 kg  Height:         Intake/Output Summary (Last 24 hours) at 09/04/2019 0724 Last data filed at 09/04/2019 0353 Gross per 24 hour  Intake 720 ml  Output 0 ml  Net 720 ml      PHYSICAL EXAM  General: Well developed, well nourished, in no acute distress HEENT:  Normocephalic and atramatic Neck:  No JVD.  Lungs: Clear bilaterally to auscultation and percussion. Heart: HRRR . Normal S1 and S2 without gallops or murmurs.  Abdomen: Bowel sounds are positive, abdomen soft and non-tender  Msk:  Back normal, normal gait. Normal strength and tone for age. Extremities: No clubbing, cyanosis or edema.  Bilateral BKA's Neuro: Alert and oriented X 3. Psych:  Good affect, responds appropriately   LABS: Basic Metabolic Panel: Recent Labs    09/02/19 1046 09/03/19 0039  NA 136 139  K 4.8 4.2  CL 93* 94*  CO2 31 31  GLUCOSE 247* 200*  BUN 31* 42*  CREATININE 5.35* 6.19*  CALCIUM 8.3* 8.2*  PHOS  --  5.6*   Liver Function Tests: Recent Labs    09/02/19 1046 09/03/19 0039  AST 19 18  ALT 32 30  ALKPHOS 179* 180*  BILITOT 0.5 0.5  PROT 7.1 7.3  ALBUMIN 3.5 3.5   No results for input(s): LIPASE, AMYLASE in the last 72 hours. CBC: Recent Labs    09/02/19 1046 09/03/19 0039  WBC 5.4 6.9  NEUTROABS 3.8  --   HGB 9.9* 10.1*  HCT 32.3* 32.1*  MCV 98.8 97.0  PLT 107* 111*   Cardiac Enzymes: No results for input(s): CKTOTAL, CKMB, CKMBINDEX, TROPONINI in the last 72 hours. BNP: Invalid input(s):  POCBNP D-Dimer: No results for input(s): DDIMER in the last 72 hours. Hemoglobin A1C: Recent Labs    09/03/19 0039  HGBA1C 7.6*   Fasting Lipid Panel: No results for input(s): CHOL, HDL, LDLCALC, TRIG, CHOLHDL, LDLDIRECT in the last 72 hours. Thyroid Function Tests: No results for input(s): TSH, T4TOTAL, T3FREE, THYROIDAB in the last 72 hours.  Invalid input(s): FREET3 Anemia Panel: No results for input(s): VITAMINB12, FOLATE, FERRITIN, TIBC, IRON, RETICCTPCT in the last 72 hours.  DG Chest 2 View  Result Date: 09/02/2019 CLINICAL DATA:  Hypoxia EXAM: CHEST - 2 VIEW COMPARISON:  May 01, 2019 FINDINGS: There are small pleural effusions bilaterally with patchy airspace opacity in the periphery of each lung base. Mild atelectatic changes noted in each mid lung. Lungs elsewhere are clear. Heart is enlarged with pulmonary vascularity normal. Patient is status post coronary artery bypass grafting. There is aortic atherosclerosis. Central catheter tip is in the right atrium slightly beyond the cavoatrial junction. No pneumothorax. No bone lesions. IMPRESSION: Small pleural effusions bilaterally with ill-defined airspace opacity in the periphery of each lower lobe, concerning for foci of pneumonia. Areas of mild atelectatic change also present. Stable cardiomegaly with pulmonary vascularity normal. Postoperative changes noted. Central catheter tip in right  atrium. Aortic Atherosclerosis (ICD10-I70.0). Electronically Signed   By: Lowella Grip III M.D.   On: 09/02/2019 11:59   CT CHEST WO CONTRAST  Result Date: 09/02/2019 CLINICAL DATA:  Patient woke up not feeling well. Low oxygen saturations. EXAM: CT CHEST WITHOUT CONTRAST TECHNIQUE: Multidetector CT imaging of the chest was performed following the standard protocol without IV contrast. COMPARISON:  02/12/2018 FINDINGS: Cardiovascular: Heart is enlarged. There is dense atherosclerotic calcification of the coronary arteries. LEFT IJ central  line tip is in the RIGHT atrium. There is no pericardial effusion. There is dense atherosclerotic calcification of the thoracic aorta, not associated with aneurysm. Mediastinum/Nodes: There is mild thickening of the esophageal wall consistent with esophagitis. There are numerous small mediastinal lymph nodes, not reaching grade 2 for pathologic enlargement and possibly reactive. Lungs/Pleura: Small bilateral pleural effusions. Pleural calcifications are stable. There is bibasilar atelectasis. There is septal thickening and mild dependent airspace filling of RIGHT greater than LEFT. Findings are consistent mild pulmonary edema. Upper Abdomen: Calcifications are identified within the liver and spleen consistent with prior granulomatous disease. Punctate calcifications within the UPPER poles of the kidneys may represent intrarenal calculi or vascular calcifications. Musculoskeletal: No chest wall mass or suspicious bone lesions identified. Median sternotomy. IMPRESSION: 1. Cardiomegaly, small bilateral pleural effusions, and mild pulmonary edema. 2. Coronary artery disease. 3. Mild thickening of the esophageal wall consistent with esophagitis. 4. Aortic Atherosclerosis (ICD10-I70.0). Electronically Signed   By: Nolon Nations M.D.   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)   Peripheral vascular disease (HCC)   Acute on chronic respiratory failure with hypoxia (HCC)   Acute on chronic combined systolic (congestive) and diastolic (congestive) heart failure (HCC)    Chronic systolic CHF (congestive heart failure) ([place])    Plan Continue therapy for heart failure including aggressive dialysis treatment Continue medical therapy for coronary artery disease Maintain hypertensive control Coreg clonidine Agree with diabetes management control Recommend continue Lipitor therapy for lipid management Continue Imdur therapy for stable chronic anginal symptoms Peripheral vascular disease status post BKA bilaterally continue current management Recommend conservative cardiac input at this point   Yolonda Kida, MD 09/04/2019 7:24 AM

## 2019-09-04 NOTE — Plan of Care (Signed)

## 2019-09-04 NOTE — Progress Notes (Signed)
[facility]  ROUNDING NOTE   Subjective:   Hemodialysis treatment yesterday. No ultrafiltration recorded. Patient with shortness of breath and complaining of abdominal distention.    Objective:  Vital signs in last 24 hours:  Temp:  [97.8 F (36.6 C)-98.5 F (36.9 C)] 97.9 F (36.6 C) (05/15 0343) Pulse Rate:  [55-65] 65 (05/15 0950) Resp:  [8-19] 19 (05/15 0343) BP: (134-213)/(54-88) 170/67 (05/15 0343) SpO2:  [95 %-100 %] 100 % (05/15 0343) FiO2 (%):  [98 %] 98 % (05/14 1150) Weight:  [56.9 kg-57.6 kg] 56.9 kg (05/15 0343)  Weight change: 0.447 kg Filed Weights   09/03/19 0157 09/03/19 1150 09/04/19 0343  Weight: 56.2 kg 57.6 kg 56.9 kg    Intake/Output: I/O last 3 completed shifts: In: 720 [P.O.:720] Out: 0    Intake/Output this shift:  No intake/output data recorded.  Physical Exam: General: No acute distress  Head: Normocephalic, atraumatic. Moist oral mucosal membranes  Eyes: Anicteric  Neck: Supple, trachea midline  Lungs:  Bilateral basilar crackles  Heart: regular  Abdomen:  Soft, nontender, bowel sounds present  Extremities: B/L lower extremity amputations  Neurologic: Awake, alert, following commands  Skin: No lesions  Access: Left IJ PermCath    Basic Metabolic Panel: Recent Labs  Lab 09/02/19 1046 09/03/19 0039  NA 136 139  K 4.8 4.2  CL 93* 94*  CO2 31 31  GLUCOSE 247* 200*  BUN 31* 42*  CREATININE 5.35* 6.19*  CALCIUM 8.3* 8.2*  PHOS  --  5.6*    Liver Function Tests: Recent Labs  Lab 09/02/19 1046 09/03/19 0039  AST 19 18  ALT 32 30  ALKPHOS 179* 180*  BILITOT 0.5 0.5  PROT 7.1 7.3  ALBUMIN 3.5 3.5   No results for input(s): LIPASE, AMYLASE in the last 168 hours. No results for input(s): AMMONIA in the last 168 hours.  CBC: Recent Labs  Lab 09/02/19 1046 09/03/19 0039  WBC 5.4 6.9  NEUTROABS 3.8  --   HGB 9.9* 10.1*  HCT 32.3* 32.1*  MCV 98.8 97.0  PLT 107* 111*    Cardiac Enzymes: No results  for input(s): CKTOTAL, CKMB, CKMBINDEX, TROPONINI in the last 168 hours.  BNP: Invalid input(s): POCBNP  CBG: Recent Labs  Lab 09/03/19 1228 09/03/19 1707 09/03/19 2059 09/04/19 0343 09/04/19 0801  GLUCAP 137* 207* 258* 105* 85    Microbiology: Results for orders placed or performed during the hospital encounter of 09/02/19  SARS Coronavirus 2 by RT PCR (hospital order, performed in [facility] hospital lab) Nasopharyngeal Nasopharyngeal Swab     Status: None   Collection Time: 09/02/19  3:55 PM   Specimen: Nasopharyngeal Swab  Result Value Ref Range Status   SARS Coronavirus 2 NEGATIVE NEGATIVE Final    Comment: (NOTE) SARS-CoV-2 target nucleic acids are NOT DETECTED. The SARS-CoV-2 RNA is generally detectable in upper and lower respiratory specimens during the acute phase of infection. The lowest concentration of SARS-CoV-2 viral copies this assay can detect is 250 copies / mL. A negative result does not preclude SARS-CoV-2 infection and should not be used as the sole basis for treatment or other patient management decisions.  A negative result may occur with improper specimen collection / handling, submission of specimen other than nasopharyngeal swab, presence of viral mutation(s) within the areas targeted by this assay, and inadequate number of viral copies (<250 copies / mL). A negative result must be combined with clinical observations, patient history, and epidemiological information. Fact Sheet for Patients:  StrictlyIdeas.no Fact Sheet for Healthcare Providers: BankingDealers.co.za This test is not yet approved or cleared  by the [place] FDA and has been authorized for detection and/or diagnosis of SARS-CoV-2 by FDA under an Emergency Use Authorization (EUA).  This EUA will remain in effect (meaning this test can be used) for the duration of the COVID-19 declaration under Section 564(b)(1) of the Act, 21  U.S.C. section 360bbb-3(b)(1), unless the authorization is terminated or revoked sooner. Performed at [facility], [address] [place], [place] [address]   MRSA PCR Screening     Status: None   Collection Time: 09/03/19  3:11 AM   Specimen: Nasal Mucosa; Nasopharyngeal  Result Value Ref Range Status   MRSA by PCR NEGATIVE NEGATIVE Final    Comment:        The GeneXpert MRSA Assay (FDA approved for NASAL specimens only), is one component of a comprehensive MRSA colonization surveillance program. It is not intended to diagnose MRSA infection nor to guide or monitor treatment for MRSA infections. Performed at [facility], [address] [place], [place] [address]     Coagulation Studies: No results for input(s): LABPROT, INR in the last 72 hours.  Urinalysis: No results for input(s): COLORURINE, LABSPEC, PHURINE, GLUCOSEU, HGBUR, BILIRUBINUR, KETONESUR, PROTEINUR, UROBILINOGEN, NITRITE, LEUKOCYTESUR in the last 72 hours.  Invalid input(s): APPERANCEUR    Imaging: DG Chest 2 View  Result Date: 09/02/2019 CLINICAL DATA:  Hypoxia EXAM: CHEST - 2 VIEW COMPARISON:  May 01, 2019 FINDINGS: There are small pleural effusions bilaterally with patchy airspace opacity in the periphery of each lung base. Mild atelectatic changes noted in each mid lung. Lungs elsewhere are clear. Heart is enlarged with pulmonary vascularity normal. Patient is status post coronary artery bypass grafting. There is aortic atherosclerosis. Central catheter tip is in the right atrium slightly beyond the cavoatrial junction. No pneumothorax. No bone lesions. IMPRESSION: Small pleural effusions bilaterally with ill-defined airspace opacity in the periphery of each lower lobe, concerning for foci of pneumonia. Areas of mild atelectatic change also present. Stable cardiomegaly with pulmonary vascularity normal. Postoperative changes noted. Central catheter tip in right atrium. Aortic  Atherosclerosis (ICD10-I70.0). Electronically Signed   By: Lowella Grip III M.D.   On: 09/02/2019 11:59   CT CHEST WO CONTRAST  Result Date: 09/02/2019 CLINICAL DATA:  Patient woke up not feeling well. Low oxygen saturations. EXAM: CT CHEST WITHOUT CONTRAST TECHNIQUE: Multidetector CT imaging of the chest was performed following the standard protocol without IV contrast. COMPARISON:  02/12/2018 FINDINGS: Cardiovascular: Heart is enlarged. There is dense atherosclerotic calcification of the coronary arteries. LEFT IJ central line tip is in the RIGHT atrium. There is no pericardial effusion. There is dense atherosclerotic calcification of the thoracic aorta, not associated with aneurysm. Mediastinum/Nodes: There is mild thickening of the esophageal wall consistent with esophagitis. There are numerous small mediastinal lymph nodes, not reaching grade 2 for pathologic enlargement and possibly reactive. Lungs/Pleura: Small bilateral pleural effusions. Pleural calcifications are stable. There is bibasilar atelectasis. There is septal thickening and mild dependent airspace filling of RIGHT greater than LEFT. Findings are consistent mild pulmonary edema. Upper Abdomen: Calcifications are identified within the liver and spleen consistent with prior granulomatous disease. Punctate calcifications within the UPPER poles of the kidneys may represent intrarenal calculi or vascular calcifications. Musculoskeletal: No chest wall mass or suspicious bone lesions identified. Median sternotomy. IMPRESSION: 1. Cardiomegaly, small bilateral pleural effusions, and mild pulmonary edema. 2. Coronary artery disease. 3. Mild thickening of the  esophageal wall consistent with esophagitis. 4. Aortic Atherosclerosis (ICD10-I70.0). Electronically Signed   By: Nolon Nations M.D.   On: 09/02/2019 18:03     Medications:   . sodium chloride    . sodium chloride     . apixaban  2.5 mg Oral BID  . aspirin EC  81 mg Oral Daily  .  atorvastatin  80 mg Oral QHS  . brimonidine  1 drop Both Eyes TID  . carvedilol  25 mg Oral BID  . Chlorhexidine Gluconate Cloth  6 each Topical Q0600  . fluticasone  2 spray Each Nare Daily  . insulin aspart  0-6 Units Subcutaneous TID WC  . insulin glargine  5 Units Subcutaneous QHS  . ipratropium  2 spray Each Nare BID  . isosorbide mononitrate  30 mg Oral Daily  . lactulose  30 g Oral Once  . levETIRAcetam  250 mg Oral Q M,W,F-HD  . mouth rinse  15 mL Mouth Rinse BID  . mirtazapine  7.5 mg Oral Daily  . omega-3 acid ethyl esters  2 capsule Oral Daily  . pantoprazole  40 mg Oral Daily  . polyvinyl alcohol  1 drop Both Eyes BID  . rOPINIRole  0.25 mg Oral QHS  . sevelamer carbonate  800 mg Oral TID WC  . sodium chloride flush  3 mL Intravenous Once  . timolol  1 drop Both Eyes TID  . vitamin B-12  1,000 mcg Oral Daily   sodium chloride, sodium chloride, alteplase, cloNIDine, heparin, lidocaine (PF), lidocaine-prilocaine, meclizine, ondansetron **OR** ondansetron (ZOFRAN) IV, pentafluoroprop-tetrafluoroeth, traMADol  Assessment/ Plan:   [name] is a 69 y.o. black female with end stage renal disease on hemodialysis, hypertension, peripheral vascular disease, bilateral below the knee amputation, coronary disease status post CABG, diabetes mellitus type 2, history of endocarditis, congestive heart failure admitted on 09/02/2019 for Acute respiratory failure with hypoxia ([place]) [J96.01] Acute on chronic combined systolic (congestive) and diastolic (congestive) heart failure ([place]) [I50.43] Hypertension, unspecified type [I10]  [facility] nephrology MWF [facility] left IJ PermCath   1.  ESRD on HD MWF.  Hemodialysis treatment yesterday. However continues to have pulmonary edema and abdominal distension.  - schedule extra dialysis treatment today for ultrafiltration only.   2.  Anemia of chronic kidney disease.  Hemoglobin currently 10.1.  Mircera as outpatient.   3.   Secondary hyperparathyroidism: phos slightly elevated at 5.6 - Sevelamer  4.  Abdominal distension - check ultrasound  5. Hypertension: 170/67. Home regimen of carvedilol, clonidine, isosorbide mononitrate.  - Do not recommend adding any agents as patient has history of syncopal episodes with hypotension of further control.    LOS: 2 Sarath Kolluru 5/15/202110:15 AM

## 2019-09-05 DIAGNOSIS — J9601 Acute respiratory failure with hypoxia: Secondary | ICD-10-CM

## 2019-09-05 DIAGNOSIS — R0602 Shortness of breath: Secondary | ICD-10-CM

## 2019-09-05 DIAGNOSIS — I5023 Acute on chronic systolic (congestive) heart failure: Secondary | ICD-10-CM

## 2019-09-05 LAB — GLUCOSE, CAPILLARY
Glucose-Capillary: 94 mg/dL (ref 70–99)
Glucose-Capillary: 127 mg/dL — ABNORMAL HIGH (ref 70–99)
Glucose-Capillary: 166 mg/dL — ABNORMAL HIGH (ref 70–99)
Glucose-Capillary: 209 mg/dL — ABNORMAL HIGH (ref 70–99)

## 2019-09-05 MED ORDER — LEVETIRACETAM 500 MG PO TABS
500.0000 mg | ORAL_TABLET | Freq: Two times a day (BID) | ORAL | Status: DC
  Administered 2019-09-05: 500 mg via ORAL
  Filled 2019-09-05: qty 1

## 2019-09-05 MED ORDER — ACETAMINOPHEN 325 MG PO TABS
650.0000 mg | ORAL_TABLET | Freq: Four times a day (QID) | ORAL | Status: DC | PRN
  Administered 2019-09-05: 650 mg via ORAL
  Filled 2019-09-05: qty 2

## 2019-09-05 MED ORDER — IPRATROPIUM BROMIDE 0.06 % NA SOLN: 2.0000 | Freq: Two times a day (BID) | NASAL | 0 refills | Status: AC

## 2019-09-05 MED ORDER — TRAMADOL HCL 50 MG PO TABS: 50.0000 mg | ORAL_TABLET | Freq: Two times a day (BID) | ORAL | 0 refills | Status: DC | PRN

## 2019-09-05 NOTE — Progress Notes (Signed)
[facility]    SUBJECTIVE: Patient feels much better today still complains of generalized weakness mild shortness of breath had dialysis yesterday no significant pain no fever chills or sweats   Vitals:   09/05/19 1612 09/05/19 1715 09/05/19 1718 09/05/19 2019  BP: (!) 157/61 (!) 159/64  (!) 197/75  Pulse: 64 62 65 64  Resp:  20    Temp:    98.8 F (37.1 C)  TempSrc:    Oral  SpO2: 90% (!) 86% 95% 100%  Weight:      Height:         Intake/Output Summary (Last 24 hours) at 09/05/2019 2251 Last data filed at 09/05/2019 2118 Gross per 24 hour  Intake 120 ml  Output 0 ml  Net 120 ml      PHYSICAL EXAM  General: Well developed, well nourished, in no acute distress HEENT:  Normocephalic and atramatic Neck:  No JVD.  Lungs: Clear bilaterally to auscultation and percussion. Heart: HRRR . Normal S1 and S2 without gallops or murmurs.  Abdomen: Bowel sounds are positive, abdomen soft and non-tender  Msk:  Back normal, normal gait. Normal strength and tone for age. Extremities: No clubbing, cyanosis or edema, bilateral BKA.   Neuro: Alert and oriented X 3. Psych:  Good affect, responds appropriately   LABS: Basic Metabolic Panel: Recent Labs    09/03/19 0039 09/04/19 1629  NA 139 137  K 4.2 4.9  CL 94* 96*  CO2 31 29  GLUCOSE 200* 127*  BUN 42* 44*  CREATININE 6.19* 6.82*  CALCIUM 8.2* 8.6*  PHOS 5.6* 5.8*   Liver Function Tests: Recent Labs    09/03/19 0039 09/04/19 1629  AST 18  --   ALT 30  --   ALKPHOS 180*  --   BILITOT 0.5  --   PROT 7.3  --   ALBUMIN 3.5 3.4*   No results for input(s): LIPASE, AMYLASE in the last 72 hours. CBC: Recent Labs    09/03/19 0039 09/04/19 1629  WBC 6.9 6.2  HGB 10.1* 9.3*  HCT 32.1* 30.2*  MCV 97.0 98.7  PLT 111* 101*   Cardiac Enzymes: No results for input(s): CKTOTAL, CKMB, CKMBINDEX, TROPONINI in the last 72 hours. BNP: Invalid input(s): POCBNP D-Dimer: No results for input(s): DDIMER in the last 72  hours. Hemoglobin A1C: Recent Labs    09/03/19 0039  HGBA1C 7.6*   Fasting Lipid Panel: No results for input(s): CHOL, HDL, LDLCALC, TRIG, CHOLHDL, LDLDIRECT in the last 72 hours. Thyroid Function Tests: No results for input(s): TSH, T4TOTAL, T3FREE, THYROIDAB in the last 72 hours.  Invalid input(s): FREET3 Anemia Panel: No results for input(s): VITAMINB12, FOLATE, FERRITIN, TIBC, IRON, RETICCTPCT in the last 72 hours.  US Abdomen Limited RUQ  Result Date: 09/04/2019 CLINICAL DATA:  Abdominal swelling, history of renal failure EXAM: ULTRASOUND ABDOMEN LIMITED RIGHT UPPER QUADRANT COMPARISON:  Ultrasound 05/01/2019, CT 05/01/2019 FINDINGS: Gallbladder: Gallbladder is slightly under distended. There is edematous mural thickening which is similar to comparison ultrasound as well as a mixture of echogenic stones and sludge within the gallbladder tip. Sonographic Percell Miller sign is reportedly negative. Common bile duct: Diameter: 1.6 mm, nondilated Liver: No focal lesion identified. Within normal limits in parenchymal echogenicity. Portal vein is patent on color Doppler imaging with normal direction of blood flow towards the liver. Other: Small volume ascites and right pleural effusion. IMPRESSION: Cholelithiasis and biliary sludge with mild edematous thickening of the gallbladder wall which was present on multiple prior exams. Sonographic  Percell Miller sign is negative which would be atypical in the setting of acute cholecystitis. Findings are somewhat nonspecific, particularly given the presence of ascites and chronicity of this feature. If there is persisting clinical concern HIDA could be obtained. Additional note made of small volume ascites and a right pleural effusion. Electronically Signed   By: Lovena Le M.D.   On: 09/04/2019 15:27     Echo mild to moderately depressed left ventricular function ejection fraction between 35 and 40%  TELEMETRY: Normal sinus rhythm ejection fraction around 65%   ASSESSMENT AND PLAN:  Principal Problem:   Acute on chronic combined systolic and diastolic CHF (congestive heart failure) (HCC) Active Problems:   Coronary artery disease involving left main coronary artery   ESRD (end stage renal disease) on dialysis [facility])   Chest pain   Accelerated hypertension   Type 2 diabetes mellitus with hyperlipidemia (HCC)   Peripheral vascular disease (HCC)   Acute respiratory failure with hypoxia (HCC)   Acute on chronic combined systolic (congestive) and diastolic (congestive) heart failure (HCC)   Acute on chronic systolic CHF (congestive heart failure) (HCC)   Abdominal swelling   Shortness of breath    PLAN Acute on chronic systolic diastolic heart failure continue the diuretic therapy continue dialysis and medical therapy Multivessel coronary disease including coronary bypass surgery at [facility] continue medical therapy Diabetes management continue current medical therapy Peripheral vascular disease by history follow-up with vascular Dyspnea shortness of breath related to heart failure volume overload Status post BKA bilaterally poor circulation ulcer follow-up with vascular Hypertension continue medical therapy   Stephanie Kida, MD 09/05/2019 10:51 PM

## 2019-09-05 NOTE — Discharge Summary (Addendum)
[facility] NAME: Stephanie Ellis    MR#:  [id number]  DATE OF BIRTH:  December 18, 1950  DATE OF ADMISSION:  09/02/2019 ADMITTING PHYSICIAN: Collier Bullock, MD  DATE OF DISCHARGE: 09/06/2019  PRIMARY CARE PHYSICIAN: Kirk Ruths, MD    ADMISSION DIAGNOSIS:  Acute respiratory failure with hypoxia (HCC) [J96.01] Acute on chronic combined systolic (congestive) and diastolic (congestive) heart failure (HCC) [I50.43] Hypertension, unspecified type [I10]  DISCHARGE DIAGNOSIS:  Principal Problem:   Acute on chronic combined systolic and diastolic CHF (congestive heart failure) (HCC) Active Problems:   Coronary artery disease involving left main coronary artery   ESRD (end stage renal disease) on dialysis (HCC)   Chest pain   Accelerated hypertension   Type 2 diabetes mellitus with hyperlipidemia (HCC)   Peripheral vascular disease (HCC)   Acute respiratory failure with hypoxia (HCC)   Acute on chronic combined systolic (congestive) and diastolic (congestive) heart failure (HCC)   Acute on chronic systolic CHF (congestive heart failure) (HCC)   Abdominal swelling   Shortness of breath   SECONDARY DIAGNOSIS:   Past Medical History:  Diagnosis Date  . (HFpEF) heart failure with preserved ejection fraction ([place])    a. 01/2018 Echo: EF 50-55%, no rwma, Gr1 DD, triv AI, mild MR. Midly dil LA/RV, mod reduced RV fxn. Irreg thickening of TV w/ mobile echodensity that appears to arise from valve.  . Anemia   . Anorexia   . Bacteremia due to Pseudomonas   . Carotid arterial disease ([place])    a. 01/2017 Carotid U/S: RICA <73, LICA <22.  Marland Kitchen Complication of anesthesia    a. Pt reports h/o complication on 9 different occasions - ? hypotension/arrest.  . 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 01/2015 Cath [facility]): 70% LM, p-mLAD 50-60% (Resting FFR 0.75), mRCA 80-90%, ~40 Ost OM & D1-->CABG; b. 01/2016 Staged  PCI of LCX x 2 and RCA.  Marland Kitchen ESRD (end stage renal disease) on dialysis ([place])    a. ESRD secondary to acute kidney failure s/p CABG-->PD  . Essential hypertension   . GERD (gastroesophageal reflux disease)   . Heart murmur   . Hypercholesterolemia   . Myocardial infarction ([place]) 2016  . PVD (peripheral vascular disease) ([place])    a. 01/30/2018 PV Angio: Sev Left Popliteal dzs s/p PTA w/ 11m lutonix DEB w/ mech aspiration of the L popliteal, L AT, and Left tibioperoneal trunck and peroneal artery.  . 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  [facility]: Dr. BMarland KitchenHaithcock: CABG x 3, LIMA to LAD, SVG to RCA, SVG to OM3, EVH  . Sinusitis 2019  . Type II diabetes mellitus with complication ([place]    CAD    HOSPITAL COURSE:   1.  Chest pain and shortness of breath and slightly elevated troponin which was flat.  Ultrasound of the abdomen showed some chronic changes about the gallbladder and only small amount of ascites. 2.  Acute on chronic systolic congestive heart failure.  Dialysis is the only way to remove fluid.  The patient does not urinate.  Patient on Coreg.  Patient has allergies to ACE inhibitor's and ARB's.  Spironolactone contraindicated.  Patient had an extra dialysis on Saturday pulling off 3 L.  Patient had dialysis today on Monday and another 3 L was pulled off. 3.  End-stage renal disease.  Patient had an extra dialysis session on Saturday.  Patient had 3 dialysis sessions.  Dialysis on Saturday pulled off 3 L and another 3 L taken off today.  Patient will likely have to have more fluid taken off as outpatient. 4.  Accelerated hypertension on Coreg and Imdur.  Patient has numerous drug allergies.  Nephrology states that patient does have syncope with low blood pressures at dialysis and they are hesitant on adding more medications. 5.  Peripheral vascular disease on Eliquis 6.  Type 2 diabetes mellitus with hyperlipidemia unspecified on atorvastatin and low-dose Lantus 7.  Seizure disorder on  Keppra 8.  Acute hypoxic respiratory failure at night.  We will set up for nighttime oxygen overnight oximetry showed numerous desaturations and quite a bit of time in the 70s and 80s.  Will check to see if she qualifies for daytime oxygen by checking a pulse ox on room air.   CONSULTS OBTAINED:  Nephrology Cardiology  DRUG ALLERGIES:   Allergies  Allergen Reactions  . Chlorthalidone Anaphylaxis, Itching and Rash  . Fentanyl Rash  . Midazolam Rash  . Ace Inhibitors Other (See Comments)    Reaction:  Hyperkalemia, agitation   . Angiotensin Receptor Blockers Other (See Comments)    Reaction:  Hyperkalemia, agitation   . Norvasc [Amlodipine] Itching and Rash  . Phenergan [Promethazine Hcl] Anxiety    "antsy, can't sit still"    DISCHARGE MEDICATIONS:   Allergies as of 09/06/2019      Reactions   Chlorthalidone Anaphylaxis, Itching, Rash   Fentanyl Rash   Midazolam Rash   Ace Inhibitors Other (See Comments)   Reaction:  Hyperkalemia, agitation    Angiotensin Receptor Blockers Other (See Comments)   Reaction:  Hyperkalemia, agitation    Norvasc [amlodipine] Itching, Rash   Phenergan [promethazine Hcl] Anxiety   "antsy, can't sit still"      Medication List    STOP taking these medications   HYDROcodone-acetaminophen 5-325 MG tablet Commonly known as: NORCO/VICODIN     TAKE these medications   apixaban 2.5 MG Tabs tablet Commonly known as: ELIQUIS Take 1 tablet (2.5 mg total) by mouth 2 (two) times daily.   aspirin EC 81 MG tablet Take 1 tablet (81 mg total) by mouth daily.   atorvastatin 80 MG tablet Commonly known as: LIPITOR Take 80 mg by mouth at bedtime.   brimonidine 0.2 % ophthalmic solution Commonly known as: ALPHAGAN Place 1 drop into both eyes 3 (three) times daily.   carvedilol 25 MG tablet Commonly known as: COREG Take 1 tablet (25 mg total) by mouth 2 (two) times daily.   cloNIDine 0.1 MG tablet Commonly known as: CATAPRES Take 0.1 mg by  mouth daily as needed (BP >160/80).   fluticasone 50 MCG/ACT nasal spray Commonly known as: FLONASE Place 2 sprays into both nostrils daily.   insulin aspart 100 UNIT/ML injection Commonly known as: novoLOG Inject 0-6 Units into the skin See admin instructions. Inject under the skin according to blood glucose reading:  <201: 0u 201- 250: 2u 251-300: 4u 301-350: 5u 351-400: 6u   insulin glargine 100 UNIT/ML injection Commonly known as: LANTUS Inject 5 Units into the skin daily in the afternoon.   ipratropium 0.06 % nasal spray Commonly known as: ATROVENT Place 2 sprays into both nostrils 2 (two) times daily.   isosorbide mononitrate 30 MG 24 hr tablet Commonly known as: IMDUR Take 30 mg by mouth daily.   levETIRAcetam 500 MG tablet Commonly known as: KEPPRA Take 1 tablet (500 mg total) by mouth 2 (two) times daily. What changed: Another medication with  the same name was changed. Make sure you understand how and when to take each.   levETIRAcetam 250 MG tablet Commonly known as: KEPPRA Three times weekly after dialysis What changed:   how much to take  how to take this  when to take this  additional instructions   meclizine 25 MG tablet Commonly known as: ANTIVERT Take 25 mg by mouth every 4 (four) hours as needed for dizziness.   mirtazapine 15 MG tablet Commonly known as: REMERON Take 7.5 mg by mouth daily.   Omega-3 1000 MG Caps Take 2 capsules by mouth daily.   pantoprazole 40 MG tablet Commonly known as: PROTONIX Take 1 tablet (40 mg total) by mouth daily.   Refresh 1.4-0.6 % Soln Generic drug: Polyvinyl Alcohol-Povidone PF Place 1 drop into both eyes 2 (two) times a day.   rOPINIRole 0.25 MG tablet Commonly known as: REQUIP Take 1 tablet (0.25 mg total) by mouth at bedtime. What changed: when to take this   sevelamer carbonate 800 MG tablet Commonly known as: RENVELA Take 800 mg by mouth 3 (three) times daily with meals.   timolol 0.5 %  ophthalmic solution Commonly known as: TIMOPTIC Place 1 drop into both eyes 3 (three) times daily.   traMADol 50 MG tablet Commonly known as: ULTRAM Take 1 tablet (50 mg total) by mouth every 12 (twelve) hours as needed for severe pain. What changed:   when to take this  reasons to take this   vitamin B-12 1000 MCG tablet Commonly known as: CYANOCOBALAMIN Take 1 tablet (1,000 mcg total) by mouth daily.            Durable Medical Equipment  (From admission, onward)         Start     Ordered   09/06/19 1153  For home use only DME oxygen  Once    Question Answer Comment  Length of Need Lifetime   Mode or (Route) Nasal cannula   Liters per Minute 2   Frequency Only at night (stationary unit needed)   Oxygen conserving device Yes   Oxygen delivery system Gas      09/06/19 1152           DISCHARGE INSTRUCTIONS:   Follow-up PCP 5 days Follow-up dialysis as scheduled  If you experience worsening of your admission symptoms, develop shortness of breath, life threatening emergency, suicidal or homicidal thoughts you must seek medical attention immediately by calling 911 or calling your MD immediately  if symptoms less severe.  You Must read complete instructions/literature along with all the possible adverse reactions/side effects for all the Medicines you take and that have been prescribed to you. Take any new Medicines after you have completely understood and accept all the possible adverse reactions/side effects.   Please note  You were cared for by a hospitalist during your hospital stay. If you have any questions about your discharge medications or the care you received while you were in the hospital after you are discharged, you can call the unit and asked to speak with the hospitalist on call if the hospitalist that took care of you is not available. Once you are discharged, your primary care physician will handle any further medical issues. Please note that NO  REFILLS for any discharge medications will be authorized once you are discharged, as it is imperative that you return to your primary care physician (or establish a relationship with a primary care physician if you do not have one) for your  aftercare needs so that they can reassess your need for medications and monitor your lab values.    Today   CHIEF COMPLAINT:   Chief Complaint  Patient presents with  . hypoxia    HISTORY OF PRESENT ILLNESS:  Stephanie Ellis  is a 69 y.o. female came in with chest pain and shortness of breath   VITAL SIGNS:  Blood pressure (!) 218/198, pulse (!) 57, temperature 98.2 F (36.8 C), temperature source Oral, resp. rate 14, height 4' 4"  (1.321 m), weight 56.5 kg, SpO2 100 %. Blood pressure very variable during hospital course.  Yesterday had a blood pressure in the 80s.  Continue usual medications as per nephrology I/O:    Intake/Output Summary (Last 24 hours) at 09/06/2019 1157 Last data filed at 09/06/2019 1130 Gross per 24 hour  Intake 120 ml  Output 5120 ml  Net -5000 ml    PHYSICAL EXAMINATION:  GENERAL:  69 y.o.-year-old patient lying in the bed with no acute distress.  EYES: Pupils equal, round, reactive to light and accommodation. No scleral icterus. Extraocular muscles intact.  HEENT: Head atraumatic, normocephalic. Oropharynx and nasopharynx clear.  LUNGS: Decreased breath sounds bilateral, no wheezing, rales,rhonchi or crepitation. No use of accessory muscles of respiration.  CARDIOVASCULAR: S1, S2 normal.  3 out of 6 systolic murmurs.  No rubs, or gallops.  ABDOMEN: Soft, non-tender, non-distended. Bowel sounds present. No organomegaly or mass.  EXTREMITIES: No stump edema, cyanosis, or clubbing.  NEUROLOGIC: Cranial nerves II through XII are intact. Muscle strength 5/5 in all extremities. Sensation intact. Gait not checked.  PSYCHIATRIC: The patient is alert and oriented x 3.  SKIN: No obvious rash, lesion, or ulcer.   DATA REVIEW:    CBC Recent Labs  Lab 09/04/19 1629  WBC 6.2  HGB 9.3*  HCT 30.2*  PLT 101*    Chemistries  Recent Labs  Lab 09/03/19 0039 09/03/19 0039 09/04/19 1629  NA 139   < > 137  K 4.2   < > 4.9  CL 94*   < > 96*  CO2 31   < > 29  GLUCOSE 200*   < > 127*  BUN 42*   < > 44*  CREATININE 6.19*   < > 6.82*  CALCIUM 8.2*   < > 8.6*  AST 18  --   --   ALT 30  --   --   ALKPHOS 180*  --   --   BILITOT 0.5  --   --    < > = values in this interval not displayed.    Cardiac Enzymes High-sensitivity troponin 57 and 59  Microbiology Results  Results for orders placed or performed during the hospital encounter of 09/02/19  SARS Coronavirus 2 by RT PCR (hospital order, performed in [facility] hospital lab) Nasopharyngeal Nasopharyngeal Swab     Status: None   Collection Time: 09/02/19  3:55 PM   Specimen: Nasopharyngeal Swab  Result Value Ref Range Status   SARS Coronavirus 2 NEGATIVE NEGATIVE Final    Comment: (NOTE) SARS-CoV-2 target nucleic acids are NOT DETECTED. The SARS-CoV-2 RNA is generally detectable in upper and lower respiratory specimens during the acute phase of infection. The lowest concentration of SARS-CoV-2 viral copies this assay can detect is 250 copies / mL. A negative result does not preclude SARS-CoV-2 infection and should not be used as the sole basis for treatment or other patient management decisions.  A negative result may occur with improper specimen collection / handling, submission  of specimen other than nasopharyngeal swab, presence of viral mutation(s) within the areas targeted by this assay, and inadequate number of viral copies (<250 copies / mL). A negative result must be combined with clinical observations, patient history, and epidemiological information. Fact Sheet for Patients:   StrictlyIdeas.no Fact Sheet for Healthcare Providers: BankingDealers.co.za This test is not yet approved or cleared   by the [place] FDA and has been authorized for detection and/or diagnosis of SARS-CoV-2 by FDA under an Emergency Use Authorization (EUA).  This EUA will remain in effect (meaning this test can be used) for the duration of the COVID-19 declaration under Section 564(b)(1) of the Act, 21 U.S.C. section 360bbb-3(b)(1), unless the authorization is terminated or revoked sooner. Performed at [facility], [address] [place], [place] [address]   MRSA PCR Screening     Status: None   Collection Time: 09/03/19  3:11 AM   Specimen: Nasal Mucosa; Nasopharyngeal  Result Value Ref Range Status   MRSA by PCR NEGATIVE NEGATIVE Final    Comment:        The GeneXpert MRSA Assay (FDA approved for NASAL specimens only), is one component of a comprehensive MRSA colonization surveillance program. It is not intended to diagnose MRSA infection nor to guide or monitor treatment for MRSA infections. Performed at [facility], [address] [place],  [address]     RADIOLOGY:  US Abdomen Limited RUQ  Result Date: 09/04/2019 CLINICAL DATA:  Abdominal swelling, history of renal failure EXAM: ULTRASOUND ABDOMEN LIMITED RIGHT UPPER QUADRANT COMPARISON:  Ultrasound 05/01/2019, CT 05/01/2019 FINDINGS: Gallbladder: Gallbladder is slightly under distended. There is edematous mural thickening which is similar to comparison ultrasound as well as a mixture of echogenic stones and sludge within the gallbladder tip. Sonographic Percell Miller sign is reportedly negative. Common bile duct: Diameter: 1.6 mm, nondilated Liver: No focal lesion identified. Within normal limits in parenchymal echogenicity. Portal vein is patent on color Doppler imaging with normal direction of blood flow towards the liver. Other: Small volume ascites and right pleural effusion. IMPRESSION: Cholelithiasis and biliary sludge with mild edematous thickening of the gallbladder wall which was present on multiple prior  exams. Sonographic Percell Miller sign is negative which would be atypical in the setting of acute cholecystitis. Findings are somewhat nonspecific, particularly given the presence of ascites and chronicity of this feature. If there is persisting clinical concern HIDA could be obtained. Additional note made of small volume ascites and a right pleural effusion. Electronically Signed   By: Lovena Le M.D.   On: 09/04/2019 15:27     Management plans discussed with the patient, and she in agreement.  CODE STATUS:     Code Status Orders  (From admission, onward)         Start     Ordered   09/02/19 1740  Full code  Continuous     09/02/19 1741        Code Status History    Date Active Date Inactive Code Status Order ID Comments User Context   05/02/2019 0516 05/04/2019 1936 DNR [id number]  Neila Gear, NP Inpatient   05/02/2019 0101 05/02/2019 0516 Full Code [id number]  Jennye Boroughs, MD ED   07/08/2018 1509 07/13/2018 2019 Partial Code [id number]  Loletha Grayer, MD ED   05/06/2018 1738 05/12/2018 2235 Partial Code [id number]  Farley Ly, RN Inpatient   05/02/2018 2122 05/06/2018 1738 DNR [id number]  Dustin Flock, MD ED   04/13/2018 1448 04/16/2018 2342 DNR [id number]  Fritzi Mandes, MD Inpatient   03/19/2018 1215 03/23/2018 1906 DNR [id number]  Fritzi Mandes, MD Inpatient   03/14/2018 2310 03/18/2018 2018 DNR [id number]  Erlene Quan, NP Inpatient   03/10/2018 0837 03/14/2018 2310 Full Code [id number]  Gladstone Lighter, MD Inpatient   02/18/2018 1000 02/28/2018 2122 Full Code [id number]  Loletha Grayer, MD ED   02/01/2018 0412 02/15/2018 2250 Full Code [id number]  Arta Silence, MD Inpatient   01/31/2018 2300 02/01/2018 0412 DNR [id number]  Amelia Jo, MD Inpatient   01/31/2018 2037 01/31/2018 2300 DNR [id number]  Awilda Bill, NP Inpatient   01/29/2018 2250 01/31/2018 2037 Full Code [id number]  Amelia Jo, MD Inpatient   10/24/2016 1055 10/25/2016 2243 Full Code [id number]   Hillary Bow, MD ED   10/17/2016 2143 10/19/2016 1440 Full Code [id number]  Vaughan Basta, MD Inpatient   09/04/2016 1708 09/05/2016 1500 Full Code [id number]  Epifanio Lesches, MD ED   08/22/2016 0938 08/22/2016 1833 Full Code [id number]  Algernon Huxley, MD Inpatient   06/27/2016 1121 06/27/2016 1846 Full Code [id number]  Algernon Huxley, MD Inpatient   10/03/2015 2235 10/04/2015 1724 Full Code [id number]  Harrie Foreman, MD Inpatient   Advance Care Planning Activity      TOTAL TIME TAKING CARE OF THIS PATIENT: 34 minutes.    Loletha Grayer M.D on 09/06/2019 at 11:57 AM  Between 7am to 6pm - Pager - [phone]  After 6pm go to [email or web address] - password [email or web address]  [facility]  CC: Primary care physician; Kirk Ruths, MD

## 2019-09-05 NOTE — Progress Notes (Addendum)
Post HD  Unable to provide full treatment to patient d/t resistance in both arterial and venous limbs of CVC, frequent pressure alarms to machine, and clotting to dializer and venous chamber. Holley Raring, MD notified who will notify vascular.      09/04/19 1755  Vital Signs  Temp 97.7 F (36.5 C)  Temp Source Oral  Pulse Rate (!) 57  Pulse Rate Source Monitor  Resp 18  BP (!) 179/91  BP Location Right Arm  BP Method Automatic  Patient Position (if appropriate) Lying  Oxygen Therapy  SpO2 100 %  O2 Device Nasal Cannula  O2 Flow Rate (L/min) 2 L/min  Pain Assessment  Pain Scale 0-10  Pain Score 0  During Hemodialysis Assessment  Dialysis Fluid Bolus Normal Saline  Bolus Amount (mL) 250 mL  Intra-Hemodialysis Comments Tx completed;Tolerated well (2531m removed)  Post-Hemodialysis Assessment  Rinseback Volume (mL) 250 mL  Dialyzer Clearance Clear  Duration of HD Treatment -hour(s) 3 hour(s)  Hemodialysis Intake (mL) 500 mL  UF Total -Machine (mL) 1500 mL  Net UF (mL) 1000 mL  Tolerated HD Treatment Yes  Post-Hemodialysis Comments  (n/a)  AVG/AVF Arterial Site Held (minutes)  (n/a)  AVG/AVF Venous Site Held (minutes)  (n/a)

## 2019-09-05 NOTE — Discharge Instructions (Signed)

## 2019-09-05 NOTE — TOC Progression Note (Signed)
Transition of Care [facility]) - Progression Note    Patient Details  Name: Stephanie Ellis MRN: [id number] Date of Birth: August 19, 1950  [facility] CM/SW [place], [place] Phone Number: [phone] 09/05/2019, 3:03 PM  Clinical Narrative:     Attending decided the patient will stay over night for observation. This CSW contacted [place] [phone], to set up [facility], when patient is ready to discharge.  Patient will discharge to [facility].  This CSW contacted her daughter Lamar Blinks, to update her on patient status.       Expected Discharge Plan and Services           Expected Discharge Date: 09/05/19                                     Social Determinants of Health (SDOH) Interventions    Readmission Risk Interventions Readmission Risk Prevention Plan 05/04/2019  Transportation Screening Complete  PCP or Specialist Appt within 3-5 Days Complete  Palliative Care Screening Not Applicable  Medication Review (RN Care Manager) Complete  Some recent data might be hidden

## 2019-09-05 NOTE — Progress Notes (Signed)
[facility]  ROUNDING NOTE   Subjective:   Extra hemodialysis treatment yesterday. UF of 3.5 liters. Patient states she is feeling better but still with some shortness of breath.    Objective:  Vital signs in last 24 hours:  Temp:  [97.7 F (36.5 C)-98.4 F (36.9 C)] 98 F (36.7 C) (05/16 0756) Pulse Rate:  [56-67] 64 (05/16 0951) Resp:  [14-20] 19 (05/16 0756) BP: (93-201)/(60-132) 93/60 (05/16 0951) SpO2:  [89 %-100 %] 90 % (05/16 0951) Weight:  [57.3 kg] 57.3 kg (05/16 0504)  Weight change: -0.311 kg Filed Weights   09/03/19 1150 09/04/19 0343 09/05/19 0504  Weight: 57.6 kg 56.9 kg 57.3 kg    Intake/Output: I/O last 3 completed shifts: In: -  Out: 3000 [Other:3000]   Intake/Output this shift:  No intake/output data recorded.  Physical Exam: General: No acute distress  Head: Normocephalic, atraumatic. Moist oral mucosal membranes  Eyes: Anicteric  Neck: Supple, trachea midline  Lungs:  Bilateral basilar crackles, left > right  Heart: regular  Abdomen:  Soft, nontender, bowel sounds present  Extremities: B/L lower extremity amputations  Neurologic: Awake, alert, following commands  Skin: No lesions  Access: Left IJ PermCath    Basic Metabolic Panel: Recent Labs  Lab 09/02/19 1046 09/03/19 0039 09/04/19 1629  NA 136 139 137  K 4.8 4.2 4.9  CL 93* 94* 96*  CO2 31 31 29   GLUCOSE 247* 200* 127*  BUN 31* 42* 44*  CREATININE 5.35* 6.19* 6.82*  CALCIUM 8.3* 8.2* 8.6*  PHOS  --  5.6* 5.8*    Liver Function Tests: Recent Labs  Lab 09/02/19 1046 09/03/19 0039 09/04/19 1629  AST 19 18  --   ALT 32 30  --   ALKPHOS 179* 180*  --   BILITOT 0.5 0.5  --   PROT 7.1 7.3  --   ALBUMIN 3.5 3.5 3.4*   No results for input(s): LIPASE, AMYLASE in the last 168 hours. No results for input(s): AMMONIA in the last 168 hours.  CBC: Recent Labs  Lab 09/02/19 1046 09/03/19 0039 09/04/19 1629  WBC 5.4 6.9 6.2  NEUTROABS 3.8  --   --   HGB 9.9*  10.1* 9.3*  HCT 32.3* 32.1* 30.2*  MCV 98.8 97.0 98.7  PLT 107* 111* 101*    Cardiac Enzymes: No results for input(s): CKTOTAL, CKMB, CKMBINDEX, TROPONINI in the last 168 hours.  BNP: Invalid input(s): POCBNP  CBG: Recent Labs  Lab 09/04/19 0343 09/04/19 0801 09/04/19 1152 09/04/19 2116 09/05/19 0757  GLUCAP 105* 85 170* 178* 80    Microbiology: Results for orders placed or performed during the hospital encounter of 09/02/19  SARS Coronavirus 2 by RT PCR (hospital order, performed in [facility] hospital lab) Nasopharyngeal Nasopharyngeal Swab     Status: None   Collection Time: 09/02/19  3:55 PM   Specimen: Nasopharyngeal Swab  Result Value Ref Range Status   SARS Coronavirus 2 NEGATIVE NEGATIVE Final    Comment: (NOTE) SARS-CoV-2 target nucleic acids are NOT DETECTED. The SARS-CoV-2 RNA is generally detectable in upper and lower respiratory specimens during the acute phase of infection. The lowest concentration of SARS-CoV-2 viral copies this assay can detect is 250 copies / mL. A negative result does not preclude SARS-CoV-2 infection and should not be used as the sole basis for treatment or other patient management decisions.  A negative result may occur with improper specimen collection / handling, submission of specimen other than nasopharyngeal swab, presence of viral  mutation(s) within the areas targeted by this assay, and inadequate number of viral copies (<250 copies / mL). A negative result must be combined with clinical observations, patient history, and epidemiological information. Fact Sheet for Patients:   StrictlyIdeas.no Fact Sheet for Healthcare Providers: BankingDealers.co.za This test is not yet approved or cleared  by the [place] FDA and has been authorized for detection and/or diagnosis of SARS-CoV-2 by FDA under an Emergency Use Authorization (EUA).  This EUA will remain in effect (meaning this  test can be used) for the duration of the COVID-19 declaration under Section 564(b)(1) of the Act, 21 U.S.C. section 360bbb-3(b)(1), unless the authorization is terminated or revoked sooner. Performed at [facility], [address] [place], [place] [address]   MRSA PCR Screening     Status: None   Collection Time: 09/03/19  3:11 AM   Specimen: Nasal Mucosa; Nasopharyngeal  Result Value Ref Range Status   MRSA by PCR NEGATIVE NEGATIVE Final    Comment:        The GeneXpert MRSA Assay (FDA approved for NASAL specimens only), is one component of a comprehensive MRSA colonization surveillance program. It is not intended to diagnose MRSA infection nor to guide or monitor treatment for MRSA infections. Performed at [facility], [address] [place], [place] [address]     Coagulation Studies: No results for input(s): LABPROT, INR in the last 72 hours.  Urinalysis: No results for input(s): COLORURINE, LABSPEC, PHURINE, GLUCOSEU, HGBUR, BILIRUBINUR, KETONESUR, PROTEINUR, UROBILINOGEN, NITRITE, LEUKOCYTESUR in the last 72 hours.  Invalid input(s): APPERANCEUR    Imaging: US Abdomen Limited RUQ  Result Date: 09/04/2019 CLINICAL DATA:  Abdominal swelling, history of renal failure EXAM: ULTRASOUND ABDOMEN LIMITED RIGHT UPPER QUADRANT COMPARISON:  Ultrasound 05/01/2019, CT 05/01/2019 FINDINGS: Gallbladder: Gallbladder is slightly under distended. There is edematous mural thickening which is similar to comparison ultrasound as well as a mixture of echogenic stones and sludge within the gallbladder tip. Sonographic Percell Miller sign is reportedly negative. Common bile duct: Diameter: 1.6 mm, nondilated Liver: No focal lesion identified. Within normal limits in parenchymal echogenicity. Portal vein is patent on color Doppler imaging with normal direction of blood flow towards the liver. Other: Small volume ascites and right pleural effusion. IMPRESSION: Cholelithiasis and  biliary sludge with mild edematous thickening of the gallbladder wall which was present on multiple prior exams. Sonographic Percell Miller sign is negative which would be atypical in the setting of acute cholecystitis. Findings are somewhat nonspecific, particularly given the presence of ascites and chronicity of this feature. If there is persisting clinical concern HIDA could be obtained. Additional note made of small volume ascites and a right pleural effusion. Electronically Signed   By: Lovena Le M.D.   On: 09/04/2019 15:27     Medications:    . apixaban  2.5 mg Oral BID  . aspirin EC  81 mg Oral Daily  . atorvastatin  80 mg Oral QHS  . brimonidine  1 drop Both Eyes TID  . carvedilol  25 mg Oral BID  . Chlorhexidine Gluconate Cloth  6 each Topical Q0600  . fluticasone  2 spray Each Nare Daily  . insulin aspart  0-6 Units Subcutaneous TID WC  . insulin glargine  5 Units Subcutaneous QHS  . ipratropium  2 spray Each Nare BID  . isosorbide mononitrate  30 mg Oral Daily  . lactulose  30 g Oral Once  . levETIRAcetam  250 mg Oral Q M,W,F-HD  . mouth rinse  15  mL Mouth Rinse BID  . mirtazapine  7.5 mg Oral Daily  . omega-3 acid ethyl esters  2 capsule Oral Daily  . pantoprazole  40 mg Oral Daily  . polyvinyl alcohol  1 drop Both Eyes BID  . rOPINIRole  0.25 mg Oral QHS  . sevelamer carbonate  800 mg Oral TID WC  . sodium chloride flush  3 mL Intravenous Once  . timolol  1 drop Both Eyes TID  . vitamin B-12  1,000 mcg Oral Daily   cloNIDine, diphenhydrAMINE, meclizine, ondansetron **OR** ondansetron (ZOFRAN) IV, traMADol  Assessment/ Plan:   Stephanie Ellis is a 69 y.o. black female with end stage renal disease on hemodialysis, hypertension, peripheral vascular disease, bilateral below the knee amputation, coronary disease status post CABG, diabetes mellitus type 2, history of endocarditis, congestive heart failure admitted on 09/02/2019 for Acute respiratory failure with hypoxia ([place])  [J96.01] Acute on chronic combined systolic (congestive) and diastolic (congestive) heart failure ([place]) [I50.43] Hypertension, unspecified type [I10]  [facility] nephrology MWF [facility] left IJ PermCath   1.  ESRD on HD MWF.  Extra hemodialysis treatment yesterday.  - weight patient today to get a better idea of what her estimated dry weight is.   2.  Anemia of chronic kidney disease.  Hemoglobin currently 9.3 Mircera as outpatient.   3.  Secondary hyperparathyroidism: phos slightly elevated at 5.8 - Sevelamer  4.  Hypertension: Home regimen of carvedilol, clonidine, isosorbide mononitrate.  - Do not recommend adding any agents as patient has history of syncopal episodes with hypotension of further control.    LOS: 3 Zurri Rudden 5/16/202110:12 AM

## 2019-09-05 NOTE — Progress Notes (Signed)
Patient ID: Stephanie Ellis, female   DOB: [birth year], 69 y.o.   MRN: [id number] [facility] PROGRESS NOTE  Stephanie Ellis ZDG:[id number] DOB: 1950/11/22 DOA: 09/02/2019 PCP: Kirk Ruths, MD  HPI/Subjective: Patient felt well this morning with no chest pain or shortness of breath.  This afternoon felt dizzy and weak and had a low blood pressure and low pulse ox.  Patient lie down and took a nap.  Blood pressure up after nap but pulse ox variable.  Discharge canceled.  Objective: Vitals:   09/05/19 1105 09/05/19 1321  BP: (!) 133/46 (!) 89/42  Pulse: (!) 59 66  Resp:    Temp:    SpO2: 93% (!) 83%    Intake/Output Summary (Last 24 hours) at 09/05/2019 1459 Last data filed at 09/04/2019 2009 Gross per 24 hour  Intake --  Output 3000 ml  Net -3000 ml   Filed Weights   09/04/19 0343 09/05/19 0504 09/05/19 1108  Weight: 56.9 kg 57.3 kg 57.5 kg    ROS: Review of Systems  Constitutional: Negative for fever.  Eyes: Negative for blurred vision.  Respiratory: Negative for cough and shortness of breath.   Cardiovascular: Negative for chest pain.  Gastrointestinal: Negative for abdominal pain, nausea and vomiting.  Genitourinary: Negative for dysuria.  Musculoskeletal: Negative for joint pain.  Neurological: Positive for dizziness.   Exam: Physical Exam  Constitutional: She is oriented to person, place, and time.  HENT:  Nose: No mucosal edema.  Mouth/Throat: No oropharyngeal exudate or posterior oropharyngeal edema.  Eyes: Pupils are equal, round, and reactive to light. Conjunctivae, EOM and lids are normal.  Neck: No JVD present. Carotid bruit is not present. No thyroid mass and no thyromegaly present.  Cardiovascular: S1 normal and S2 normal. Exam reveals no gallop.  No murmur heard. Respiratory: No respiratory distress. She has decreased breath sounds in the right lower field and the left lower field. She has no wheezes. She has no rhonchi. She has no rales.  GI:  Soft. Bowel sounds are normal. She exhibits distension. There is abdominal tenderness.  Musculoskeletal:     Cervical back: No edema.     Right knee: No swelling.     Left knee: No swelling.  Lymphadenopathy:    She has no cervical adenopathy.  Neurological: She is alert and oriented to person, place, and time. No cranial nerve deficit.  Skin: Skin is warm. No rash noted. Nails show no clubbing.  Psychiatric: She has a normal mood and affect.      Data Reviewed: Basic Metabolic Panel: Recent Labs  Lab 09/02/19 1046 09/03/19 0039 09/04/19 1629  NA 136 139 137  K 4.8 4.2 4.9  CL 93* 94* 96*  CO2 31 31 29   GLUCOSE 247* 200* 127*  BUN 31* 42* 44*  CREATININE 5.35* 6.19* 6.82*  CALCIUM 8.3* 8.2* 8.6*  PHOS  --  5.6* 5.8*   Liver Function Tests: Recent Labs  Lab 09/02/19 1046 09/03/19 0039 09/04/19 1629  AST 19 18  --   ALT 32 30  --   ALKPHOS 179* 180*  --   BILITOT 0.5 0.5  --   PROT 7.1 7.3  --   ALBUMIN 3.5 3.5 3.4*   CBC: Recent Labs  Lab 09/02/19 1046 09/03/19 0039 09/04/19 1629  WBC 5.4 6.9 6.2  NEUTROABS 3.8  --   --   HGB 9.9* 10.1* 9.3*  HCT 32.3* 32.1* 30.2*  MCV 98.8 97.0 98.7  PLT 107* 111* 101*   BNP (last  3 results) Recent Labs    09/02/19 1555  BNP >4,500.0*    CBG: Recent Labs  Lab 09/04/19 0801 09/04/19 1152 09/04/19 2116 09/05/19 0757 09/05/19 1144  GLUCAP 85 170* 178* 94 127*    Recent Results (from the past 240 hour(s))  SARS Coronavirus 2 by RT PCR (hospital order, performed in [facility] hospital lab) Nasopharyngeal Nasopharyngeal Swab     Status: None   Collection Time: 09/02/19  3:55 PM   Specimen: Nasopharyngeal Swab  Result Value Ref Range Status   SARS Coronavirus 2 NEGATIVE NEGATIVE Final    Comment: (NOTE) SARS-CoV-2 target nucleic acids are NOT DETECTED. The SARS-CoV-2 RNA is generally detectable in upper and lower respiratory specimens during the acute phase of infection. The lowest concentration of  SARS-CoV-2 viral copies this assay can detect is 250 copies / mL. A negative result does not preclude SARS-CoV-2 infection and should not be used as the sole basis for treatment or other patient management decisions.  A negative result may occur with improper specimen collection / handling, submission of specimen other than nasopharyngeal swab, presence of viral mutation(s) within the areas targeted by this assay, and inadequate number of viral copies (<250 copies / mL). A negative result must be combined with clinical observations, patient history, and epidemiological information. Fact Sheet for Patients:   StrictlyIdeas.no Fact Sheet for Healthcare Providers: BankingDealers.co.za This test is not yet approved or cleared  by the [place] FDA and has been authorized for detection and/or diagnosis of SARS-CoV-2 by FDA under an Emergency Use Authorization (EUA).  This EUA will remain in effect (meaning this test can be used) for the duration of the COVID-19 declaration under Section 564(b)(1) of the Act, 21 U.S.C. section 360bbb-3(b)(1), unless the authorization is terminated or revoked sooner. Performed at [facility], [address] [place], [place] [address]   MRSA PCR Screening     Status: None   Collection Time: 09/03/19  3:11 AM   Specimen: Nasal Mucosa; Nasopharyngeal  Result Value Ref Range Status   MRSA by PCR NEGATIVE NEGATIVE Final    Comment:        The GeneXpert MRSA Assay (FDA approved for NASAL specimens only), is one component of a comprehensive MRSA colonization surveillance program. It is not intended to diagnose MRSA infection nor to guide or monitor treatment for MRSA infections. Performed at [facility], [address] [place], [place] [address]      Studies: US Abdomen Limited RUQ  Result Date: 09/04/2019 CLINICAL DATA:  Abdominal swelling, history of renal failure EXAM:  ULTRASOUND ABDOMEN LIMITED RIGHT UPPER QUADRANT COMPARISON:  Ultrasound 05/01/2019, CT 05/01/2019 FINDINGS: Gallbladder: Gallbladder is slightly under distended. There is edematous mural thickening which is similar to comparison ultrasound as well as a mixture of echogenic stones and sludge within the gallbladder tip. Sonographic Percell Miller sign is reportedly negative. Common bile duct: Diameter: 1.6 mm, nondilated Liver: No focal lesion identified. Within normal limits in parenchymal echogenicity. Portal vein is patent on color Doppler imaging with normal direction of blood flow towards the liver. Other: Small volume ascites and right pleural effusion. IMPRESSION: Cholelithiasis and biliary sludge with mild edematous thickening of the gallbladder wall which was present on multiple prior exams. Sonographic Percell Miller sign is negative which would be atypical in the setting of acute cholecystitis. Findings are somewhat nonspecific, particularly given the presence of ascites and chronicity of this feature. If there is persisting clinical concern HIDA could be obtained. Additional note made of small  volume ascites and a right pleural effusion. Electronically Signed   By: Lovena Le M.D.   On: 09/04/2019 15:27    Scheduled Meds: . apixaban  2.5 mg Oral BID  . aspirin EC  81 mg Oral Daily  . atorvastatin  80 mg Oral QHS  . brimonidine  1 drop Both Eyes TID  . carvedilol  25 mg Oral BID  . Chlorhexidine Gluconate Cloth  6 each Topical Q0600  . fluticasone  2 spray Each Nare Daily  . insulin aspart  0-6 Units Subcutaneous TID WC  . insulin glargine  5 Units Subcutaneous QHS  . ipratropium  2 spray Each Nare BID  . isosorbide mononitrate  30 mg Oral Daily  . lactulose  30 g Oral Once  . levETIRAcetam  250 mg Oral Q M,W,F-HD  . mouth rinse  15 mL Mouth Rinse BID  . mirtazapine  7.5 mg Oral Daily  . omega-3 acid ethyl esters  2 capsule Oral Daily  . pantoprazole  40 mg Oral Daily  . polyvinyl alcohol  1 drop  Both Eyes BID  . rOPINIRole  0.25 mg Oral QHS  . sevelamer carbonate  800 mg Oral TID WC  . sodium chloride flush  3 mL Intravenous Once  . timolol  1 drop Both Eyes TID  . vitamin B-12  1,000 mcg Oral Daily      Assessment/Plan:  1. Chest pain and shortness of breath and slightly elevated troponin.  These have resolved. 2. Acute on chronic systolic congestive heart failure.  3 L removed with dialysis yesterday.  Nephrology to calculate new dry weight and will have to remove more fluid with dialysis. 3. Acute hypoxic respiratory failure.  When awake her pulse ox is good but variable when she is sleeping.  Will cancel discharge get an overnight oximetry to see if she qualifies for nighttime oxygen. 4. End-stage renal disease.  Had extra dialysis yesterday 5. Accelerated hypertension on presentation episode of low blood pressure this afternoon.  Patient on Coreg and Imdur. 6. Peripheral vascular disease on Eliquis 7. Type 2 diabetes mellitus with hyperlipidemia unspecified on atorvastatin.  Patient on low-dose Lantus. 8. History of seizure on Keppra      Code Status:     Code Status Orders  (From admission, onward)         Start     Ordered   09/02/19 1740  Full code  Continuous     09/02/19 1741        Code Status History    Date Active Date Inactive Code Status Order ID Comments User Context   05/02/2019 0516 05/04/2019 1936 DNR [id number]  Neila Gear, NP Inpatient   05/02/2019 0101 05/02/2019 0516 Full Code [id number]  Jennye Boroughs, MD ED   07/08/2018 1509 07/13/2018 2019 Partial Code [id number]  Loletha Grayer, MD ED   05/06/2018 1738 05/12/2018 2235 Partial Code [id number]  Farley Ly, RN Inpatient   05/02/2018 2122 05/06/2018 1738 DNR [id number]  Dustin Flock, MD ED   04/13/2018 1448 04/16/2018 2342 DNR [id number]  Fritzi Mandes, MD Inpatient   03/19/2018 1215 03/23/2018 1906 DNR [id number]  Fritzi Mandes, MD Inpatient   03/14/2018 2310 03/18/2018 2018 DNR [id number]   Erlene Quan, NP Inpatient   03/10/2018 0837 03/14/2018 2310 Full Code [id number]  Gladstone Lighter, MD Inpatient   02/18/2018 1000 02/28/2018 2122 Full Code [id number]  Loletha Grayer, MD ED   02/01/2018 0412 02/15/2018 2250 Full Code [id number]  Arta Silence, MD Inpatient  01/31/2018 2300 02/01/2018 0412 DNR [id number]  Amelia Jo, MD Inpatient   01/31/2018 2037 01/31/2018 2300 DNR [id number]  Awilda Bill, NP Inpatient   01/29/2018 2250 01/31/2018 2037 Full Code [id number]  Amelia Jo, MD Inpatient   10/24/2016 1055 10/25/2016 2243 Full Code [id number]  Hillary Bow, MD ED   10/17/2016 2143 10/19/2016 1440 Full Code [id number]  Vaughan Basta, MD Inpatient   09/04/2016 1708 09/05/2016 1500 Full Code [id number]  Epifanio Lesches, MD ED   08/22/2016 0938 08/22/2016 1833 Full Code [id number]  Algernon Huxley, MD Inpatient   06/27/2016 1121 06/27/2016 1846 Full Code [id number]  Algernon Huxley, MD Inpatient   10/03/2015 2235 10/04/2015 1724 Full Code [id number]  Harrie Foreman, MD Inpatient   Advance Care Planning Activity     Disposition Plan: Status is: Inpatient   Dispo: The patient is from: Assisted living              Anticipated d/c is to: Assisted living              Anticipated d/c date is: 09/06/2019              Patient currently had an episode of hypotension and low pulse ox this afternoon.  Discharge was canceled.  We will get an overnight oximetry to see if she qualifies for oxygen while she is sleeping.  Consultants:  Nephrology  Time spent: 27 minutes, case discussed with nephrology, nursing and transitional care team  [facility]  [facility]

## 2019-09-05 NOTE — TOC Transition Note (Signed)
Transition of Care [facility]) - CM/SW Discharge Note   Patient Details  Name: Stephanie Ellis MRN: [id number] Date of Birth: [birth year]  Transition of Care [facility]) CM/SW Contact:  Meriel Flavors, LCSW Phone Number: 09/05/2019, 1:46 PM   Clinical Narrative:    Pt being discharged, CSW arranging transport back to the facility [facility]         Patient Goals and CMS Choice        Discharge Placement                       Discharge Plan and Services                                     Social Determinants of Health (SDOH) Interventions     Readmission Risk Interventions Readmission Risk Prevention Plan 05/04/2019  Transportation Screening Complete  PCP or Specialist Appt within 3-5 Days Complete  Palliative Care Screening Not Applicable  Medication Review (RN Care Manager) Complete  Some recent data might be hidden

## 2019-09-05 NOTE — TOC Progression Note (Addendum)
Transition of Care [facility]) - Progression Note    Patient Details  Name: Stephanie Ellis MRN: [id number] Date of Birth: [birth year]  Transition of Care [facility]) CM/SW [place], [place] Phone Number: [phone] 09/05/2019, 2:08 PM  Clinical Narrative:     This CSW spoke with the patient's daughter [name] to update her on d/c disposition.  [name] informed this CSW her mother may not want to go to a SNF because she has just gotten settled in at  [employer].  This CSW went to speak with the patient, about disposition, and found the patient laying on w/ her head on the foot of the bed.  The patient stated she was "very dizzy and did not feel well."  Patient's responses were lethargic and she has trouble following the conversation.  This CSW called nurse, to check on the patient.  The nurse took the patient's BP and it was 89/42, the patient stated she was still very dizzy and "I don't understand why they are sending me home, if I feel the same way I did five days ago."  This CSW asked the patient if she wanted to talk about disposition or if she wanted me to return.  The patient said she was okay to talk, and this CSW explained her options SNF or home health.  The patient was slow to answer and seemed to have some difficulty w/ her thought process.  The patient did tell this CSW she did want to stay at [facility], and she did not want to move again.  This CSW informed the patient home health could be set up and that I would contact her daughter and let her know.  The nurse stated she would contact the attending about patient's BP, and this CSW stated she would return when the patient was feeling better. This CSW contacted the patient's daughter and updated her on the patient's choice oh home health.  The patient's daughter verbalized her understanding.  This CSW did inform the patient's daughter, the patient was feeling dizzy and not feeling well, and I would contact her w/ an update once I  saw one.      Expected Discharge Plan and Services           Expected Discharge Date: 09/05/19                                     Social Determinants of Health (SDOH) Interventions    Readmission Risk Interventions Readmission Risk Prevention Plan 05/04/2019  Transportation Screening Complete  PCP or Specialist Appt within 3-5 Days Complete  Palliative Care Screening Not Applicable  Medication Review (RN Care Manager) Complete  Some recent data might be hidden

## 2019-09-06 ENCOUNTER — Encounter: Payer: Self-pay | Admitting: Internal Medicine

## 2019-09-06 DIAGNOSIS — G4734 Idiopathic sleep related nonobstructive alveolar hypoventilation: Secondary | ICD-10-CM

## 2019-09-06 LAB — GLUCOSE, CAPILLARY
Glucose-Capillary: 123 mg/dL — ABNORMAL HIGH (ref 70–99)
Glucose-Capillary: 75 mg/dL (ref 70–99)

## 2019-09-06 NOTE — Progress Notes (Signed)
Patient ID: Stephanie Ellis, female   DOB: 1951/04/21, 69 y.o.   MRN: [id number] [facility] PROGRESS NOTE  Yalda Herd TFT:[id number] DOB: 07/13/1950 DOA: 09/02/2019 PCP: Kirk Ruths, MD  HPI/Subjective: Patient feeling okay right now.  No complaints of chest pain or shortness of breath.  Another 3 L taken off with dialysis today.  Objective: Vitals:   09/06/19 1124 09/06/19 1130  BP: (!) 218/68 (!) 218/198  Pulse: (!) 55 (!) 57  Resp: 14 14  Temp: 98.2 F (36.8 C) 98.2 F (36.8 C)  SpO2: 100% 100%    Intake/Output Summary (Last 24 hours) at 09/06/2019 1200 Last data filed at 09/06/2019 1130 Gross per 24 hour  Intake 120 ml  Output 5120 ml  Net -5000 ml   Filed Weights   09/06/19 0815 09/06/19 1115 09/06/19 1130  Weight: 58.2 kg 56.5 kg 56.5 kg    ROS: Review of Systems  Constitutional: Negative for fever.  Eyes: Negative for blurred vision.  Respiratory: Negative for cough and shortness of breath.   Cardiovascular: Negative for chest pain.  Gastrointestinal: Negative for abdominal pain, nausea and vomiting.  Genitourinary: Negative for dysuria.  Musculoskeletal: Negative for joint pain.  Neurological: Negative for dizziness.   Exam: Physical Exam  Constitutional: She is oriented to person, place, and time.  HENT:  Nose: No mucosal edema.  Mouth/Throat: No oropharyngeal exudate or posterior oropharyngeal edema.  Eyes: Pupils are equal, round, and reactive to light. Conjunctivae, EOM and lids are normal.  Neck: No JVD present. Carotid bruit is not present. No thyroid mass and no thyromegaly present.  Cardiovascular: S1 normal and S2 normal. Exam reveals no gallop.  No murmur heard. Respiratory: No respiratory distress. She has decreased breath sounds in the right lower field and the left lower field. She has no wheezes. She has no rhonchi. She has no rales.  GI: Soft. Bowel sounds are normal. She exhibits distension. There is abdominal tenderness.   Musculoskeletal:     Cervical back: No edema.     Right knee: No swelling.     Left knee: No swelling.  Lymphadenopathy:    She has no cervical adenopathy.  Neurological: She is alert and oriented to person, place, and time. No cranial nerve deficit.  Skin: Skin is warm. No rash noted. Nails show no clubbing.  Psychiatric: She has a normal mood and affect.      Data Reviewed: Basic Metabolic Panel: Recent Labs  Lab 09/02/19 1046 09/03/19 0039 09/04/19 1629  NA 136 139 137  K 4.8 4.2 4.9  CL 93* 94* 96*  CO2 31 31 29   GLUCOSE 247* 200* 127*  BUN 31* 42* 44*  CREATININE 5.35* 6.19* 6.82*  CALCIUM 8.3* 8.2* 8.6*  PHOS  --  5.6* 5.8*   Liver Function Tests: Recent Labs  Lab 09/02/19 1046 09/03/19 0039 09/04/19 1629  AST 19 18  --   ALT 32 30  --   ALKPHOS 179* 180*  --   BILITOT 0.5 0.5  --   PROT 7.1 7.3  --   ALBUMIN 3.5 3.5 3.4*   CBC: Recent Labs  Lab 09/02/19 1046 09/03/19 0039 09/04/19 1629  WBC 5.4 6.9 6.2  NEUTROABS 3.8  --   --   HGB 9.9* 10.1* 9.3*  HCT 32.3* 32.1* 30.2*  MCV 98.8 97.0 98.7  PLT 107* 111* 101*   BNP (last 3 results) Recent Labs    09/02/19 1555  BNP >4,500.0*    CBG: Recent Labs  Lab  09/05/19 0757 09/05/19 1144 09/05/19 1715 09/05/19 2135 09/06/19 0722  GLUCAP 94 127* 166* 209* 123*    Recent Results (from the past 240 hour(s))  SARS Coronavirus 2 by RT PCR (hospital order, performed in [facility] hospital lab) Nasopharyngeal Nasopharyngeal Swab     Status: None   Collection Time: 09/02/19  3:55 PM   Specimen: Nasopharyngeal Swab  Result Value Ref Range Status   SARS Coronavirus 2 NEGATIVE NEGATIVE Final    Comment: (NOTE) SARS-CoV-2 target nucleic acids are NOT DETECTED. The SARS-CoV-2 RNA is generally detectable in upper and lower respiratory specimens during the acute phase of infection. The lowest concentration of SARS-CoV-2 viral copies this assay can detect is 250 copies / mL. A negative result does  not preclude SARS-CoV-2 infection and should not be used as the sole basis for treatment or other patient management decisions.  A negative result may occur with improper specimen collection / handling, submission of specimen other than nasopharyngeal swab, presence of viral mutation(s) within the areas targeted by this assay, and inadequate number of viral copies (<250 copies / mL). A negative result must be combined with clinical observations, patient history, and epidemiological information. Fact Sheet for Patients:   StrictlyIdeas.no Fact Sheet for Healthcare Providers: BankingDealers.co.za This test is not yet approved or cleared  by the [place] FDA and has been authorized for detection and/or diagnosis of SARS-CoV-2 by FDA under an Emergency Use Authorization (EUA).  This EUA will remain in effect (meaning this test can be used) for the duration of the COVID-19 declaration under Section 564(b)(1) of the Act, 21 U.S.C. section 360bbb-3(b)(1), unless the authorization is terminated or revoked sooner. Performed at [facility], [address] [place], [place] [address]   MRSA PCR Screening     Status: None   Collection Time: 09/03/19  3:11 AM   Specimen: Nasal Mucosa; Nasopharyngeal  Result Value Ref Range Status   MRSA by PCR NEGATIVE NEGATIVE Final    Comment:        The GeneXpert MRSA Assay (FDA approved for NASAL specimens only), is one component of a comprehensive MRSA colonization surveillance program. It is not intended to diagnose MRSA infection nor to guide or monitor treatment for MRSA infections. Performed at [facility], [address] [place], [place] [address]      Studies: US Abdomen Limited RUQ  Result Date: 09/04/2019 CLINICAL DATA:  Abdominal swelling, history of renal failure EXAM: ULTRASOUND ABDOMEN LIMITED RIGHT UPPER QUADRANT COMPARISON:  Ultrasound 05/01/2019, CT 05/01/2019  FINDINGS: Gallbladder: Gallbladder is slightly under distended. There is edematous mural thickening which is similar to comparison ultrasound as well as a mixture of echogenic stones and sludge within the gallbladder tip. Sonographic Percell Miller sign is reportedly negative. Common bile duct: Diameter: 1.6 mm, nondilated Liver: No focal lesion identified. Within normal limits in parenchymal echogenicity. Portal vein is patent on color Doppler imaging with normal direction of blood flow towards the liver. Other: Small volume ascites and right pleural effusion. IMPRESSION: Cholelithiasis and biliary sludge with mild edematous thickening of the gallbladder wall which was present on multiple prior exams. Sonographic Percell Miller sign is negative which would be atypical in the setting of acute cholecystitis. Findings are somewhat nonspecific, particularly given the presence of ascites and chronicity of this feature. If there is persisting clinical concern HIDA could be obtained. Additional note made of small volume ascites and a right pleural effusion. Electronically Signed   By: Lovena Le M.D.   On: 09/04/2019  15:27    Scheduled Meds: . apixaban  2.5 mg Oral BID  . aspirin EC  81 mg Oral Daily  . atorvastatin  80 mg Oral QHS  . brimonidine  1 drop Both Eyes TID  . carvedilol  25 mg Oral BID  . Chlorhexidine Gluconate Cloth  6 each Topical Q0600  . fluticasone  2 spray Each Nare Daily  . insulin aspart  0-6 Units Subcutaneous TID WC  . insulin glargine  5 Units Subcutaneous QHS  . ipratropium  2 spray Each Nare BID  . isosorbide mononitrate  30 mg Oral Daily  . lactulose  30 g Oral Once  . levETIRAcetam  250 mg Oral Q M,W,F-HD  . levETIRAcetam  500 mg Oral BID  . mouth rinse  15 mL Mouth Rinse BID  . mirtazapine  7.5 mg Oral Daily  . omega-3 acid ethyl esters  2 capsule Oral Daily  . pantoprazole  40 mg Oral Daily  . polyvinyl alcohol  1 drop Both Eyes BID  . rOPINIRole  0.25 mg Oral QHS  . sevelamer  carbonate  800 mg Oral TID WC  . sodium chloride flush  3 mL Intravenous Once  . timolol  1 drop Both Eyes TID  . vitamin B-12  1,000 mcg Oral Daily      Assessment/Plan:  1. Chest pain and shortness of breath and slightly elevated troponin.  These have resolved. 2. Acute on chronic systolic congestive heart failure.  3 L removed with dialysis Saturday.  3 L removed today with dialysis.  Nephrology to calculate new dry weight and will have to remove more fluid with dialysis. 3. Acute hypoxic respiratory failure.  Nocturnal hypoxia.  Overnight oximetry showed numerous time spent saturating 70 and 80%.  Set up for nighttime oxygen and check a pulse ox on room air now to see if she qualifies for 24/7 oxygen 4. End-stage renal disease.  Continue dialysis Monday Wednesday Friday. 5. Accelerated hypertension on presentation episode of low blood pressure yesterday afternoon.  Patient on Coreg and Imdur.  No changes in blood pressure medications as per nephrology 6. Peripheral vascular disease on Eliquis 7. Type 2 diabetes mellitus with hyperlipidemia unspecified on atorvastatin.  Patient on low-dose Lantus. 8. History of seizure on Keppra      Code Status:     Code Status Orders  (From admission, onward)         Start     Ordered   09/02/19 1740  Full code  Continuous     09/02/19 1741        Code Status History    Date Active Date Inactive Code Status Order ID Comments User Context   05/02/2019 0516 05/04/2019 1936 DNR [id number]  Neila Gear, NP Inpatient   05/02/2019 0101 05/02/2019 0516 Full Code [id number]  Jennye Boroughs, MD ED   07/08/2018 1509 07/13/2018 2019 Partial Code [id number]  Loletha Grayer, MD ED   05/06/2018 1738 05/12/2018 2235 Partial Code [id number]  Farley Ly, RN Inpatient   05/02/2018 2122 05/06/2018 1738 DNR [id number]  Dustin Flock, MD ED   04/13/2018 1448 04/16/2018 2342 DNR [id number]  Fritzi Mandes, MD Inpatient   03/19/2018 1215 03/23/2018 1906 DNR  [id number]  Fritzi Mandes, MD Inpatient   03/14/2018 2310 03/18/2018 2018 DNR [id number]  Erlene Quan, NP Inpatient   03/10/2018 0837 03/14/2018 2310 Full Code [id number]  Gladstone Lighter, MD Inpatient   02/18/2018 1000 02/28/2018 2122 Full Code [id number]  Loletha Grayer, MD ED  02/01/2018 0412 02/15/2018 2250 Full Code [id number]  Arta Silence, MD Inpatient   01/31/2018 2300 02/01/2018 0412 DNR [id number]  Amelia Jo, MD Inpatient   01/31/2018 2037 01/31/2018 2300 DNR [id number]  Awilda Bill, NP Inpatient   01/29/2018 2250 01/31/2018 2037 Full Code [id number]  Amelia Jo, MD Inpatient   10/24/2016 1055 10/25/2016 2243 Full Code [id number]  Hillary Bow, MD ED   10/17/2016 2143 10/19/2016 1440 Full Code [id number]  Vaughan Basta, MD Inpatient   09/04/2016 1708 09/05/2016 1500 Full Code [id number]  Epifanio Lesches, MD ED   08/22/2016 0938 08/22/2016 1833 Full Code [id number]  Algernon Huxley, MD Inpatient   06/27/2016 1121 06/27/2016 1846 Full Code [id number]  Algernon Huxley, MD Inpatient   10/03/2015 2235 10/04/2015 1724 Full Code [id number]  Harrie Foreman, MD Inpatient   Advance Care Planning Activity     Disposition Plan: Status is: Inpatient   Dispo: The patient is from: Assisted living              Anticipated d/c is to: Assisted living with home health              Anticipated d/c date is: 09/06/2019              Patient currently qualifies for nocturnal oxygen and will check to see if qualifies for 24/7 oxygen prior to disposition today  Consultants:  Nephrology  Time spent: 34 minutes, case discussed with nephrology, nursing and transitional care team  [place]

## 2019-09-06 NOTE — Progress Notes (Signed)
Patient alert and oriented, vss, no complaints of pain.  Patient to be transferred back to her facility.  Awaiting EMS.  D/C telemetry and PIV.

## 2019-09-06 NOTE — Progress Notes (Signed)
Patient transferred to HD

## 2019-09-06 NOTE — Progress Notes (Signed)
Pre HD Assessment    09/06/19 0813  Neurological  Level of Consciousness Alert  Orientation Level Oriented X4  Respiratory  Respiratory Pattern Regular;Unlabored  Chest Assessment Chest expansion symmetrical  Bilateral Breath Sounds Clear;Diminished  Cardiac  ECG Monitor Yes  Antiarrhythmic device No  Vascular  R Radial Pulse +2  L Radial Pulse +2  Edema Facial  Facial +1  Integumentary  Integumentary (WDL) X  Skin Integrity Amputation  Musculoskeletal  Musculoskeletal (WDL) X  Generalized Weakness Yes  GU Assessment  Genitourinary (WDL) X  Genitourinary Symptoms Anuria  Psychosocial  Psychosocial (WDL) WDL

## 2019-09-06 NOTE — Progress Notes (Signed)
Post HD Assessment    09/06/19 1131  Neurological  Level of Consciousness Alert  Orientation Level Oriented X4  Respiratory  Respiratory Pattern Regular;Unlabored  Chest Assessment Chest expansion symmetrical  Bilateral Breath Sounds Clear;Diminished  Cardiac  ECG Monitor Yes  Antiarrhythmic device No  Vascular  R Radial Pulse +2  L Radial Pulse +2  Edema Facial  Facial +1  Integumentary  Integumentary (WDL) X  Skin Integrity Amputation  Musculoskeletal  Musculoskeletal (WDL) X  Generalized Weakness Yes  GU Assessment  Genitourinary (WDL) X  Genitourinary Symptoms Anuria  Psychosocial  Psychosocial (WDL) WDL

## 2019-09-06 NOTE — Progress Notes (Signed)
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

## 2019-09-06 NOTE — TOC Transition Note (Signed)
Transition of Care [facility]) - CM/SW Discharge Note   Patient Details  Name: Batsheva Stevick MRN: [id number] Date of Birth: 08-21-1950  Transition of Care [facility]) CM/SW Contact:  Victorino Dike, RN Phone Number: 09/06/2019, 2:38 PM   Clinical Narrative:     Patient to be transferred back to [facility] [place] today by [facility].  Oxygen to be delivered to facility prior to discharge.  Discharge packet on chart Bedside Nurse aware.     Final next level of care: Assisted Living Barriers to Discharge: Barriers Resolved   Patient Goals and CMS Choice Patient states their goals for this hospitalization and ongoing recovery are:: "To go back to where I was, I just got settled in."      Discharge Placement                Patient to be transferred to facility by: [facility]   Patient and family notified of of transfer: 09/06/19  Discharge Plan and Services In-house Referral: Clinical Social Work Discharge Planning Services: Other - See comment(Home Health PT) Post Acute Care Choice: Home Health          DME Arranged: Oxygen DME Agency: Other - Comment([employer]) Date DME Agency Contacted: 09/06/19 Time DME Agency Contacted: 1657 Representative spoke with at DME Agency: [facility] HH Arranged: PT [place]: [place] ([place]) Date [place]: 09/05/19 Time [place]: 1516 Representative spoke with at [place]: [place] ([place]) Interventions     Readmission Risk Interventions Readmission Risk Prevention Plan 05/04/2019  Transportation Screening Complete  PCP or Specialist Appt within 3-5 Days Complete  Palliative Care Screening Not Applicable  Medication Review (RN Care Manager) Complete  Some recent data might be hidden

## 2019-09-06 NOTE — NC FL2 (Deleted)
[place] LEVEL OF CARE SCREENING TOOL     IDENTIFICATION  Patient Name: Stephanie Ellis Birthdate: [birth year] Sex: female Admission Date (Current Location): 09/02/2019  [place] and [place] Number:  Engineering geologist and Address:  [facility], [address] [place], [place] [address]      Provider Number: [id number]  Attending Physician Name and Address:  Loletha Grayer, MD  Relative Name and Phone Number:  [name] [phone]    Current Level of Care: Hospital Recommended Level of Care: [place] Prior Approval Number:    Date Approved/Denied:   PASRR Number:    Discharge Plan: Other (Comment)([facility])    Current Diagnoses: Patient Active Problem List   Diagnosis Date Noted  . Nocturnal hypoxia   . Shortness of breath   . Abdominal swelling   . Acute on chronic systolic CHF (congestive heart failure) ([place])   . Acute respiratory failure with hypoxia ([place]) 09/02/2019  . Acute on chronic combined systolic (congestive) and diastolic (congestive) heart failure ([place]) 09/02/2019  . 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 Severe anemia 05/01/2019  . Pneumonia due to COVID-19 virus 05/01/2019  . Resides in skilled nursing facility 12/29/2018  . CVA (cerebral vascular accident) ([place]) 07/08/2018  . Infection of amputation stump ([place]) 05/02/2018  . BKA stump complication ([place]) 41/63/8453  . Unilateral complete BKA, right, initial encounter ([place]) 03/30/2018  . Transient loss of consciousness 03/19/2018  . Transient alteration of awareness   . VT (ventricular tachycardia) ([place])   . Disorientation   . Hypertensive urgency 03/08/2018  . Status post below-knee amputation of both lower extremities ([place]) 03/02/2018  . Ischemia of extremity 02/18/2018  . CKD (chronic kidney disease) stage 5, GFR less than 15 ml/min (HCC) 02/16/2018  . Diabetic osteomyelitis ([place])   . Peripheral vascular disease ([place])    . Advanced care planning/counseling discussion   . Palliative care by specialist   . Goals of care, counseling/discussion   . Type 2 diabetes mellitus with hyperlipidemia ([place]) 01/30/2018  . Sepsis ([place]) 01/29/2018  . Peripheral neuropathy 06/17/2017  . Encephalopathy 02/06/2017  . Accelerated hypertension 02/06/2017  . Endophthalmitis, left eye 12/05/2016  . Vomiting 10/24/2016  . Hematuria 10/17/2016  . Acute lower UTI 10/17/2016  . Anesthesia complication 64/68/0321  . Chest pain 09/04/2016  . Steal syndrome dialysis vascular access, initial encounter ([place]) 06/18/2016  . 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 Coronary artery disease involving coronary bypass graft of native heart with angina pectoris ([place]) 02/10/2016  . Hypertension 02/02/2016  . Hyperlipidemia 02/02/2016  . Coronary artery disease involving left main coronary artery 10/04/2015  . Abdominal pain of unknown etiology 10/04/2015  . ESRD (end stage renal disease) on dialysis ([place])   . Type II diabetes mellitus with complication ([place])   . NSTEMI (non-ST elevated myocardial infarction) ([place])   . Right sided abdominal pain   . Elevated troponin 10/03/2015  . 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 Acute on chronic combined systolic and diastolic CHF (congestive heart failure) ([place]) 04/19/2014  . Anemia 09/20/2013  . Routine health maintenance 01/29/2013    Orientation RESPIRATION BLADDER Height & Weight     Self, Time, Situation, Place  Normal Incontinent Weight: 56.5 kg Height:  4' 4"  (132.1 cm)  BEHAVIORAL SYMPTOMS/MOOD NEUROLOGICAL BOWEL NUTRITION STATUS      Incontinent, Continent    AMBULATORY STATUS COMMUNICATION OF NEEDS Skin   Total Care Verbally  Personal Care Assistance Level of Assistance  Feeding, Bathing, Dressing Bathing Assistance: Limited assistance Feeding assistance: Limited assistance Dressing Assistance: Limited assistance     Functional  Limitations Info             SPECIAL CARE FACTORS FREQUENCY                       Contractures Contractures Info: Not present    Additional Factors Info  Code Status, Allergies(HD) Code Status Info: Full Allergies Info: Chlorthalidone,fentanyl, midozolam, ace inhibitors, angiotensin receptor blockers, norvasc, phenergan           Current Medications (09/06/2019):  This is the current hospital active medication list Current Facility-Administered Medications  Medication Dose Route Frequency Provider Last Rate Last Admin  . acetaminophen (TYLENOL) tablet 650 mg  650 mg Oral Q6H PRN Lang Snow, NP   650 mg at 09/05/19 2234  . apixaban (ELIQUIS) tablet 2.5 mg  2.5 mg Oral BID Collier Bullock, MD   Stopped at 09/06/19 1000  . aspirin EC tablet 81 mg  81 mg Oral Daily Agbata, Tochukwu, MD   Stopped at 09/06/19 1000  . atorvastatin (LIPITOR) tablet 80 mg  80 mg Oral QHS Agbata, Tochukwu, MD   80 mg at 09/05/19 2133  . brimonidine (ALPHAGAN) 0.2 % ophthalmic solution 1 drop  1 drop Both Eyes TID Agbata, Tochukwu, MD   Stopped at 09/06/19 1000  . carvedilol (COREG) tablet 25 mg  25 mg Oral BID Agbata, Tochukwu, MD   25 mg at 09/06/19 1218  . Chlorhexidine Gluconate Cloth 2 % PADS 6 each  6 each Topical Q0600 Anthonette Legato, MD   6 each at 09/04/19 0534  . cloNIDine (CATAPRES) tablet 0.1 mg  0.1 mg Oral Daily PRN Agbata, Tochukwu, MD   0.1 mg at 09/04/19 1723  . diphenhydrAMINE (BENADRYL) capsule 25 mg  25 mg Oral Q6H PRN Loletha Grayer, MD   25 mg at 09/04/19 1254  . fluticasone (FLONASE) 50 MCG/ACT nasal spray 2 spray  2 spray Each Nare Daily Agbata, Tochukwu, MD   Stopped at 09/06/19 1000  . insulin aspart (novoLOG) injection 0-6 Units  0-6 Units Subcutaneous TID WC Agbata, Tochukwu, MD      . insulin glargine (LANTUS) injection 5 Units  5 Units Subcutaneous QHS Loletha Grayer, MD   5 Units at 09/05/19 2133  . ipratropium (ATROVENT) 0.06 % nasal spray 2 spray  2  spray Each Nare BID Loletha Grayer, MD   Stopped at 09/06/19 1000  . isosorbide mononitrate (IMDUR) 24 hr tablet 30 mg  30 mg Oral Daily Agbata, Tochukwu, MD   30 mg at 09/06/19 1218  . lactulose (CHRONULAC) 10 GM/15ML solution 30 g  30 g Oral Once Loletha Grayer, MD      . levETIRAcetam (KEPPRA) tablet 250 mg  250 mg Oral Q M,W,F-HD [name], RPH   250 mg at 09/03/19 2150  . levETIRAcetam (KEPPRA) tablet 500 mg  500 mg Oral BID Loletha Grayer, MD   Stopped at 09/06/19 1000  . meclizine (ANTIVERT) tablet 25 mg  25 mg Oral Q6H PRN Agbata, Tochukwu, MD      . MEDLINE mouth rinse  15 mL Mouth Rinse BID Agbata, Tochukwu, MD   Stopped at 09/06/19 1000  . mirtazapine (REMERON) tablet 7.5 mg  7.5 mg Oral Daily Agbata, Tochukwu, MD   Stopped at 09/06/19 1000  . omega-3 acid ethyl esters (LOVAZA) capsule 2 g  2 capsule Oral  Daily Collier Bullock, MD   Stopped at 09/06/19 1000  . ondansetron (ZOFRAN) tablet 4 mg  4 mg Oral Q6H PRN Agbata, Tochukwu, MD       Or  . ondansetron (ZOFRAN) injection 4 mg  4 mg Intravenous Q6H PRN Agbata, Tochukwu, MD      . pantoprazole (PROTONIX) EC tablet 40 mg  40 mg Oral Daily Agbata, Tochukwu, MD   Stopped at 09/06/19 1000  . polyvinyl alcohol (LIQUIFILM TEARS) 1.4 % ophthalmic solution 1 drop  1 drop Both Eyes BID [name], RPH   Stopped at 09/06/19 1000  . rOPINIRole (REQUIP) tablet 0.25 mg  0.25 mg Oral QHS Agbata, Tochukwu, MD   0.25 mg at 09/05/19 2133  . sevelamer carbonate (RENVELA) tablet 800 mg  800 mg Oral TID WC Agbata, Tochukwu, MD   800 mg at 09/06/19 1218  . sodium chloride flush (NS) 0.9 % injection 3 mL  3 mL Intravenous Once Arta Silence, MD      . timolol (TIMOPTIC) 0.5 % ophthalmic solution 1 drop  1 drop Both Eyes TID Collier Bullock, MD   Stopped at 09/06/19 1000  . traMADol (ULTRAM) tablet 50 mg  50 mg Oral Q8H PRN Agbata, Tochukwu, MD   50 mg at 09/04/19 2156  . vitamin B-12 (CYANOCOBALAMIN) tablet 1,000 mcg  1,000 mcg Oral  Daily Agbata, Tochukwu, MD   Stopped at 09/06/19 1000     Discharge Medications: Please see discharge summary for a list of discharge medications.  Relevant Imaging Results:  Relevant Lab Results:   Additional Information SSn: [id number]  Victorino Dike, RN

## 2019-09-06 NOTE — Progress Notes (Signed)
Post HD     09/06/19 1130  Vital Signs  Temp 98.2 F (36.8 C)  Temp Source Oral  Pulse Rate (!) 57  Pulse Rate Source Dinamap  Resp 14  BP (!) 218/198  BP Location Right Arm  BP Method Automatic  Patient Position (if appropriate) Lying  Oxygen Therapy  SpO2 100 %  O2 Device Nasal Cannula  O2 Flow Rate (L/min) 3 L/min  Pulse Oximetry Type Continuous  Pain Assessment  Pain Scale 0-10  Pain Score 0  Dialysis Weight  Weight 56.5 kg  Type of Weight Post-Dialysis  Post-Hemodialysis Assessment  Rinseback Volume (mL) 250 mL  KECN 61.7 V  Dialyzer Clearance Clear  Duration of HD Treatment -hour(s) 3 hour(s)  Hemodialysis Intake (mL) 500 mL  UF Total -Machine (mL) 3060 mL  Net UF (mL) 2560 mL  Tolerated HD Treatment Yes  Post-Hemodialysis Comments  (no)  AVG/AVF Arterial Site Held (minutes)  (n/a)  AVG/AVF Venous Site Held (minutes)  (n/a)  Hemodialysis Catheter Left Subclavian Double-lumen  Placement Date: 02/11/18   Placed prior to admission: No  Orientation: Left  Access Location: Subclavian  Hemodialysis Catheter Type: Double-lumen  Site Condition No complications  Blue Lumen Status Heparin locked  Red Lumen Status Heparin locked  Purple Lumen Status N/A  Dressing Type Occlusive  Dressing Status Clean;Dry;Intact  Interventions New dressing  Drainage Description None  Dressing Change Due 09/12/19  Post treatment catheter status Capped and Clamped

## 2019-09-06 NOTE — Care Management Important Message (Signed)
Important Message  Patient Details  Name: Stephanie Ellis MRN: [id number] Date of Birth: 1950-09-10   Medicare Important Message Given:  Yes     Dannette Barbara 09/06/2019, 12:33 PM

## 2019-09-06 NOTE — Progress Notes (Signed)
HD Complete    09/06/19 1124  Vital Signs  Temp 98.2 F (36.8 C)  Temp Source Oral  Pulse Rate (!) 55  Pulse Rate Source Dinamap  Resp 14  BP (!) 218/68  BP Location Right Arm  BP Method Automatic  Patient Position (if appropriate) Lying  Oxygen Therapy  SpO2 100 %  O2 Device Nasal Cannula  O2 Flow Rate (L/min) 3 L/min  Pulse Oximetry Type Continuous  Pain Assessment  Pain Scale 0-10  Pain Score 0  During Hemodialysis Assessment  HD Safety Checks Performed Yes  KECN 61.7 KECN  Dialysis Fluid Bolus Normal Saline  Bolus Amount (mL) 250 mL  Intra-Hemodialysis Comments Tx completed

## 2019-09-06 NOTE — Progress Notes (Signed)
[facility]  ROUNDING NOTE   Subjective:   Seen and examined on hemodialysis Tolerating treatment well. UF goal of 3 liters.    HEMODIALYSIS FLOWSHEET:  Blood Flow Rate (mL/min): 365 mL/min Arterial Pressure (mmHg): -180 mmHg Venous Pressure (mmHg): 140 mmHg Transmembrane Pressure (mmHg): 60 mmHg Ultrafiltration Rate (mL/min): 1020 mL/min Dialysate Flow Rate (mL/min): 600 ml/min Conductivity: Machine : 14 Conductivity: Machine : 14 Dialysis Fluid Bolus: Normal Saline Bolus Amount (mL): 250 mL Dialysate Change: 2K    Objective:  Vital signs in last 24 hours:  Temp:  [98 F (36.7 C)-98.8 F (37.1 C)] 98 F (36.7 C) (05/17 1205) Pulse Rate:  [55-66] 60 (05/17 1205) Resp:  [5-20] 14 (05/17 1130) BP: (89-227)/(42-198) 193/67 (05/17 1205) SpO2:  [83 %-100 %] 93 % (05/17 1207) FiO2 (%):  [98 %-99 %] 99 % (05/17 1115) Weight:  [55.7 kg-58.2 kg] 56.5 kg (05/17 1130)  Weight change: 0.181 kg Filed Weights   09/06/19 0815 09/06/19 1115 09/06/19 1130  Weight: 58.2 kg 56.5 kg 56.5 kg    Intake/Output: I/O last 3 completed shifts: In: 120 [P.O.:120] Out: 0    Intake/Output this shift:  Total I/O In: -  Out: 5120 [Other:5120]  Physical Exam: General: No acute distress  Head: Normocephalic, atraumatic. Moist oral mucosal membranes  Eyes: Anicteric  Neck: Supple, trachea midline  Lungs:  Bilateral basilar crackles  Heart: regular  Abdomen:  Soft, nontender, bowel sounds present  Extremities: B/L lower extremity amputations  Neurologic: Awake, alert, following commands  Skin: No lesions  Access: Left IJ PermCath    Basic Metabolic Panel: Recent Labs  Lab 09/02/19 1046 09/03/19 0039 09/04/19 1629  NA 136 139 137  K 4.8 4.2 4.9  CL 93* 94* 96*  CO2 31 31 29   GLUCOSE 247* 200* 127*  BUN 31* 42* 44*  CREATININE 5.35* 6.19* 6.82*  CALCIUM 8.3* 8.2* 8.6*  PHOS  --  5.6* 5.8*    Liver Function Tests: Recent Labs  Lab 09/02/19 1046  09/03/19 0039 09/04/19 1629  AST 19 18  --   ALT 32 30  --   ALKPHOS 179* 180*  --   BILITOT 0.5 0.5  --   PROT 7.1 7.3  --   ALBUMIN 3.5 3.5 3.4*   No results for input(s): LIPASE, AMYLASE in the last 168 hours. No results for input(s): AMMONIA in the last 168 hours.  CBC: Recent Labs  Lab 09/02/19 1046 09/03/19 0039 09/04/19 1629  WBC 5.4 6.9 6.2  NEUTROABS 3.8  --   --   HGB 9.9* 10.1* 9.3*  HCT 32.3* 32.1* 30.2*  MCV 98.8 97.0 98.7  PLT 107* 111* 101*    Cardiac Enzymes: No results for input(s): CKTOTAL, CKMB, CKMBINDEX, TROPONINI in the last 168 hours.  BNP: Invalid input(s): POCBNP  CBG: Recent Labs  Lab 09/05/19 1144 09/05/19 1715 09/05/19 2135 09/06/19 0722 09/06/19 1207  GLUCAP 127* 166* 209* 123* 75    Microbiology: Results for orders placed or performed during the hospital encounter of 09/02/19  SARS Coronavirus 2 by RT PCR (hospital order, performed in [facility] hospital lab) Nasopharyngeal Nasopharyngeal Swab     Status: None   Collection Time: 09/02/19  3:55 PM   Specimen: Nasopharyngeal Swab  Result Value Ref Range Status   SARS Coronavirus 2 NEGATIVE NEGATIVE Final    Comment: (NOTE) SARS-CoV-2 target nucleic acids are NOT DETECTED. The SARS-CoV-2 RNA is generally detectable in upper and lower respiratory specimens during the acute phase of  infection. The lowest concentration of SARS-CoV-2 viral copies this assay can detect is 250 copies / mL. A negative result does not preclude SARS-CoV-2 infection and should not be used as the sole basis for treatment or other patient management decisions.  A negative result may occur with improper specimen collection / handling, submission of specimen other than nasopharyngeal swab, presence of viral mutation(s) within the areas targeted by this assay, and inadequate number of viral copies (<250 copies / mL). A negative result must be combined with clinical observations, patient history, and  epidemiological information. Fact Sheet for Patients:   StrictlyIdeas.no Fact Sheet for Healthcare Providers: BankingDealers.co.za This test is not yet approved or cleared  by the [place] FDA and has been authorized for detection and/or diagnosis of SARS-CoV-2 by FDA under an Emergency Use Authorization (EUA).  This EUA will remain in effect (meaning this test can be used) for the duration of the COVID-19 declaration under Section 564(b)(1) of the Act, 21 U.S.C. section 360bbb-3(b)(1), unless the authorization is terminated or revoked sooner. Performed at [facility], [address] [place], [place] [address]   MRSA PCR Screening     Status: None   Collection Time: 09/03/19  3:11 AM   Specimen: Nasal Mucosa; Nasopharyngeal  Result Value Ref Range Status   MRSA by PCR NEGATIVE NEGATIVE Final    Comment:        The GeneXpert MRSA Assay (FDA approved for NASAL specimens only), is one component of a comprehensive MRSA colonization surveillance program. It is not intended to diagnose MRSA infection nor to guide or monitor treatment for MRSA infections. Performed at [facility], [address] [place], [place] [address]     Coagulation Studies: No results for input(s): LABPROT, INR in the last 72 hours.  Urinalysis: No results for input(s): COLORURINE, LABSPEC, PHURINE, GLUCOSEU, HGBUR, BILIRUBINUR, KETONESUR, PROTEINUR, UROBILINOGEN, NITRITE, LEUKOCYTESUR in the last 72 hours.  Invalid input(s): APPERANCEUR    Imaging: US Abdomen Limited RUQ  Result Date: 09/04/2019 CLINICAL DATA:  Abdominal swelling, history of renal failure EXAM: ULTRASOUND ABDOMEN LIMITED RIGHT UPPER QUADRANT COMPARISON:  Ultrasound 05/01/2019, CT 05/01/2019 FINDINGS: Gallbladder: Gallbladder is slightly under distended. There is edematous mural thickening which is similar to comparison ultrasound as well as a mixture of echogenic  stones and sludge within the gallbladder tip. Sonographic Percell Miller sign is reportedly negative. Common bile duct: Diameter: 1.6 mm, nondilated Liver: No focal lesion identified. Within normal limits in parenchymal echogenicity. Portal vein is patent on color Doppler imaging with normal direction of blood flow towards the liver. Other: Small volume ascites and right pleural effusion. IMPRESSION: Cholelithiasis and biliary sludge with mild edematous thickening of the gallbladder wall which was present on multiple prior exams. Sonographic Percell Miller sign is negative which would be atypical in the setting of acute cholecystitis. Findings are somewhat nonspecific, particularly given the presence of ascites and chronicity of this feature. If there is persisting clinical concern HIDA could be obtained. Additional note made of small volume ascites and a right pleural effusion. Electronically Signed   By: Lovena Le M.D.   On: 09/04/2019 15:27     Medications:    . apixaban  2.5 mg Oral BID  . aspirin EC  81 mg Oral Daily  . atorvastatin  80 mg Oral QHS  . brimonidine  1 drop Both Eyes TID  . carvedilol  25 mg Oral BID  . Chlorhexidine Gluconate Cloth  6 each Topical Q0600  . fluticasone  2 spray  Each Nare Daily  . insulin aspart  0-6 Units Subcutaneous TID WC  . insulin glargine  5 Units Subcutaneous QHS  . ipratropium  2 spray Each Nare BID  . isosorbide mononitrate  30 mg Oral Daily  . lactulose  30 g Oral Once  . levETIRAcetam  250 mg Oral Q M,W,F-HD  . levETIRAcetam  500 mg Oral BID  . mouth rinse  15 mL Mouth Rinse BID  . mirtazapine  7.5 mg Oral Daily  . omega-3 acid ethyl esters  2 capsule Oral Daily  . pantoprazole  40 mg Oral Daily  . polyvinyl alcohol  1 drop Both Eyes BID  . rOPINIRole  0.25 mg Oral QHS  . sevelamer carbonate  800 mg Oral TID WC  . sodium chloride flush  3 mL Intravenous Once  . timolol  1 drop Both Eyes TID  . vitamin B-12  1,000 mcg Oral Daily   acetaminophen,  cloNIDine, diphenhydrAMINE, meclizine, ondansetron **OR** ondansetron (ZOFRAN) IV, traMADol  Assessment/ Plan:  [name] is a 69 y.o. black female with end stage renal disease on hemodialysis, hypertension, peripheral vascular disease, bilateral below the knee amputation, coronary disease status post CABG, diabetes mellitus type 2, history of endocarditis, congestive heart failure admitted on 09/02/2019 for Acute respiratory failure with hypoxia ([place]) [J96.01] Acute on chronic combined systolic (congestive) and diastolic (congestive) heart failure ([place]) [I50.43] Hypertension, unspecified type [I10]  [facility] nephrology MWF [facility] left IJ PermCath   1.  ESRD on HD MWF.  Seen and examined on hemodialysis treatment.   2.  Anemia of chronic kidney disease.  Hemoglobin currently 9.3 Mircera as outpatient.   3.  Secondary hyperparathyroidism: phos slightly elevated at 5.8 - Sevelamer  4.  Hypertension: Elevated during treatment.  Home regimen of carvedilol, clonidine, isosorbide mononitrate.  - Do not recommend adding any agents as patient has history of syncopal episodes with hypotension of further control.    LOS: 4 Sarath Kolluru 5/17/202112:56 PM

## 2019-09-06 NOTE — Progress Notes (Signed)
Pre HD     09/06/19 0815  Vital Signs  Temp 98.2 F (36.8 C)  Temp Source Oral  Pulse Rate (!) 59  Pulse Rate Source Dinamap  Resp 13  BP (!) 163/68  BP Location Right Arm  BP Method Automatic  Patient Position (if appropriate) Lying  Oxygen Therapy  SpO2 90 %  O2 Device Nasal Cannula  O2 Flow Rate (L/min) 3 L/min  FiO2 (%) 98 %  Patient Activity (if Appropriate) In bed  Pulse Oximetry Type Intermittent  Pain Assessment  Pain Scale 0-10  Pain Score 0  Dialysis Weight  Weight 58.2 kg  Type of Weight Pre-Dialysis  Time-Out for Hemodialysis  What Procedure? HD   Pt Identifiers(min of two) First/Last Name;MRN/Account#;Pt's DOB(use if MRN/Acct# not available  Correct Site? Yes  Correct Side? Yes  Correct Procedure? Yes  Consents Verified? Yes  Rad Studies Available? N/A  Safety Precautions Reviewed? Yes  Engineer, civil (consulting) Number 2  Station Number [id number]  UF/Alarm Test Passed  Conductivity: Meter 14.2  Conductivity: Machine  14  pH 7.2  Normal Saline Lot Number [id number]  Dialyzer Lot Number [id number]  Disposable Set Lot Number [id number]  Dialysate Acid Bath Lot Number [id number]  Dialysate HCO3 Bath Lot Number [id number]  Machine Temperature 98.2 F (36.8 C)  Musician and Audible Yes  Blood Lines Intact and Secured Yes  Pre Treatment Patient Checks  Vascular access used during treatment Catheter  Patient is receiving dialysis in a chair Yes  Hepatitis B Surface Antigen Results Negative  Date Hepatitis B Surface Antigen Drawn 05/02/19  Isolation Initiated Yes  Date Hepatitis B Surface Antibody Drawn 05/02/19  Hemodialysis Consent Verified Yes  Hemodialysis Standing Orders Initiated Yes  ECG (Telemetry) Monitor On Yes  Prime Ordered Normal Saline  Length of  DialysisTreatment -hour(s) 3 Hour(s)  Dialysis Treatment Comments Treatment Started  Dialyzer Elisio 17H NR  Dialysis Anticoagulant None  Dialysate Flow Ordered 600  Blood Flow Rate Ordered 400 mL/min   Ultrafiltration Goal 2500 Liters  Dialysis Blood Pressure Support Ordered Normal Saline  During Hemodialysis Assessment  Blood Flow Rate (mL/min) 400 mL/min  Arterial Pressure (mmHg) -30 mmHg  Venous Pressure (mmHg) 60 mmHg  Transmembrane Pressure (mmHg) 20 mmHg  Ultrafiltration Rate (mL/min) 1000 mL/min  Dialysate Flow Rate (mL/min) 600 ml/min  Conductivity: Machine  14.2  HD Safety Checks Performed Yes  Dialysis Fluid Bolus Normal Saline  Bolus Amount (mL) 250 mL  Intra-Hemodialysis Comments Tx initiated  Education / Care Plan  Dialysis Education Provided Yes  Documented Education in Care Plan Yes  Outpatient Plan of Care Reviewed and on Chart Yes  Hemodialysis Catheter Left Subclavian Double-lumen  Placement Date: 02/11/18   Placed prior to admission: No  Orientation: Left  Access Location: Subclavian  Hemodialysis Catheter Type: Double-lumen  Site Condition No complications  Blue Lumen Status Blood return noted  Red Lumen Status Blood return noted  Purple Lumen Status N/A  Dressing Type Occlusive  Dressing Status Clean;Dry;Intact  Interventions  (n/a)  Drainage Description None  Dressing Change Due 09/12/19  Post treatment catheter status Capped and Clamped

## 2019-09-06 NOTE — Progress Notes (Signed)
HD Initiated    09/06/19 0815  Vital Signs  Temp 98.2 F (36.8 C)  Temp Source Oral  Pulse Rate (!) 59  Pulse Rate Source Dinamap  Resp 13  BP (!) 163/68  BP Location Right Arm  BP Method Automatic  Patient Position (if appropriate) Lying  Oxygen Therapy  SpO2 90 %  O2 Device Nasal Cannula  O2 Flow Rate (L/min) 3 L/min  FiO2 (%) 98 %  Patient Activity (if Appropriate) In bed  Pulse Oximetry Type Intermittent  Pain Assessment  Pain Scale 0-10  Pain Score 0  During Hemodialysis Assessment  Blood Flow Rate (mL/min) 400 mL/min  Arterial Pressure (mmHg) -30 mmHg  Venous Pressure (mmHg) 60 mmHg  Transmembrane Pressure (mmHg) 20 mmHg  Ultrafiltration Rate (mL/min) 1000 mL/min  Dialysate Flow Rate (mL/min) 600 ml/min  Conductivity: Machine  14.2  HD Safety Checks Performed Yes  Dialysis Fluid Bolus Normal Saline  Bolus Amount (mL) 250 mL  Intra-Hemodialysis Comments Tx initiated

## 2019-09-06 NOTE — NC FL2 (Addendum)
[place] LEVEL OF CARE SCREENING TOOL     IDENTIFICATION  Patient Name: Stephanie Ellis Birthdate: 13-Aug-1950 Sex: female Admission Date (Current Location): 09/02/2019  [place] and [place] Number:  Engineering geologist and Address:  [facility], [address] [place], [place] [address]      Provider Number: [id number]  Attending Physician Name and Address:  Loletha Grayer, MD  Relative Name and Phone Number:  [name] [phone]    Current Level of Care: Hospital Recommended Level of Care: [place] Prior Approval Number:    Date Approved/Denied:   PASRR Number:    Discharge Plan: Other (Comment)([facility])    Current Diagnoses: Patient Active Problem List   Diagnosis Date Noted  . Nocturnal hypoxia   . Shortness of breath   . Abdominal swelling   . Acute on chronic systolic CHF (congestive heart failure) ([place])   . Acute respiratory failure with hypoxia ([place]) 09/02/2019  . Acute on chronic combined systolic (congestive) and diastolic (congestive) heart failure ([place]) 09/02/2019  . 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 Severe anemia 05/01/2019  . Pneumonia due to COVID-19 virus 05/01/2019  . Resides in skilled nursing facility 12/29/2018  . CVA (cerebral vascular accident) ([place]) 07/08/2018  . Infection of amputation stump ([place]) 05/02/2018  . BKA stump complication ([place]) 81/15/7262  . Unilateral complete BKA, right, initial encounter ([place]) 03/30/2018  . Transient loss of consciousness 03/19/2018  . Transient alteration of awareness   . VT (ventricular tachycardia) ([place])   . Disorientation   . Hypertensive urgency 03/08/2018  . Status post below-knee amputation of both lower extremities ([place]) 03/02/2018  . Ischemia of extremity 02/18/2018  . CKD (chronic kidney disease) stage 5, GFR less than 15 ml/min (HCC) 02/16/2018  . Diabetic osteomyelitis ([place])   . Peripheral vascular disease ([place])    . Advanced care planning/counseling discussion   . Palliative care by specialist   . Goals of care, counseling/discussion   . Type 2 diabetes mellitus with hyperlipidemia ([place]) 01/30/2018  . Sepsis ([place]) 01/29/2018  . Peripheral neuropathy 06/17/2017  . Encephalopathy 02/06/2017  . Accelerated hypertension 02/06/2017  . Endophthalmitis, left eye 12/05/2016  . Vomiting 10/24/2016  . Hematuria 10/17/2016  . Acute lower UTI 10/17/2016  . Anesthesia complication 03/55/9741  . Chest pain 09/04/2016  . Steal syndrome dialysis vascular access, initial encounter ([place]) 06/18/2016  . 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 Coronary artery disease involving coronary bypass graft of native heart with angina pectoris ([place]) 02/10/2016  . Hypertension 02/02/2016  . Hyperlipidemia 02/02/2016  . Coronary artery disease involving left main coronary artery 10/04/2015  . Abdominal pain of unknown etiology 10/04/2015  . ESRD (end stage renal disease) on dialysis ([place])   . Type II diabetes mellitus with complication ([place])   . NSTEMI (non-ST elevated myocardial infarction) ([place])   . Right sided abdominal pain   . Elevated troponin 10/03/2015  . 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 Acute on chronic combined systolic and diastolic CHF (congestive heart failure) ([place]) 04/19/2014  . Anemia 09/20/2013  . Routine health maintenance 01/29/2013    Orientation RESPIRATION BLADDER Height & Weight     Self, Time, Situation, Place  Normal Incontinent Weight: 56.5 kg Height:  4' 4"  (132.1 cm)  BEHAVIORAL SYMPTOMS/MOOD NEUROLOGICAL BOWEL NUTRITION STATUS      Incontinent, Continent    AMBULATORY STATUS COMMUNICATION OF NEEDS Skin   Total Care Verbally  Personal Care Assistance Level of Assistance  Feeding, Bathing, Dressing Bathing Assistance: Limited assistance Feeding assistance: Limited assistance Dressing Assistance: Limited assistance     Functional  Limitations Info             SPECIAL CARE FACTORS FREQUENCY                       Contractures Contractures Info: Not present    Additional Factors Info  Code Status, Allergies(HD) Code Status Info: Full Allergies Info: Chlorthalidone,fentanyl, midozolam, ace inhibitors, angiotensin receptor blockers, norvasc, phenergan           Current Medications (09/06/2019):  This is the current hospital active medication list Current Facility-Administered Medications  Medication Dose Route Frequency Provider Last Rate Last Admin  . acetaminophen (TYLENOL) tablet 650 mg  650 mg Oral Q6H PRN Lang Snow, NP   650 mg at 09/05/19 2234  . apixaban (ELIQUIS) tablet 2.5 mg  2.5 mg Oral BID Collier Bullock, MD   Stopped at 09/06/19 1000  . aspirin EC tablet 81 mg  81 mg Oral Daily Agbata, Tochukwu, MD   Stopped at 09/06/19 1000  . atorvastatin (LIPITOR) tablet 80 mg  80 mg Oral QHS Agbata, Tochukwu, MD   80 mg at 09/05/19 2133  . brimonidine (ALPHAGAN) 0.2 % ophthalmic solution 1 drop  1 drop Both Eyes TID Agbata, Tochukwu, MD   Stopped at 09/06/19 1000  . carvedilol (COREG) tablet 25 mg  25 mg Oral BID Agbata, Tochukwu, MD   25 mg at 09/06/19 1218  . Chlorhexidine Gluconate Cloth 2 % PADS 6 each  6 each Topical Q0600 Anthonette Legato, MD   6 each at 09/04/19 0534  . cloNIDine (CATAPRES) tablet 0.1 mg  0.1 mg Oral Daily PRN Agbata, Tochukwu, MD   0.1 mg at 09/04/19 1723  . diphenhydrAMINE (BENADRYL) capsule 25 mg  25 mg Oral Q6H PRN Loletha Grayer, MD   25 mg at 09/04/19 1254  . fluticasone (FLONASE) 50 MCG/ACT nasal spray 2 spray  2 spray Each Nare Daily Agbata, Tochukwu, MD   Stopped at 09/06/19 1000  . insulin aspart (novoLOG) injection 0-6 Units  0-6 Units Subcutaneous TID WC Agbata, Tochukwu, MD      . insulin glargine (LANTUS) injection 5 Units  5 Units Subcutaneous QHS Loletha Grayer, MD   5 Units at 09/05/19 2133  . ipratropium (ATROVENT) 0.06 % nasal spray 2 spray  2  spray Each Nare BID Loletha Grayer, MD   Stopped at 09/06/19 1000  . isosorbide mononitrate (IMDUR) 24 hr tablet 30 mg  30 mg Oral Daily Agbata, Tochukwu, MD   30 mg at 09/06/19 1218  . lactulose (CHRONULAC) 10 GM/15ML solution 30 g  30 g Oral Once Loletha Grayer, MD      . levETIRAcetam (KEPPRA) tablet 250 mg  250 mg Oral Q M,W,F-HD [name], RPH   250 mg at 09/03/19 2150  . levETIRAcetam (KEPPRA) tablet 500 mg  500 mg Oral BID Loletha Grayer, MD   Stopped at 09/06/19 1000  . meclizine (ANTIVERT) tablet 25 mg  25 mg Oral Q6H PRN Agbata, Tochukwu, MD      . MEDLINE mouth rinse  15 mL Mouth Rinse BID Agbata, Tochukwu, MD   Stopped at 09/06/19 1000  . mirtazapine (REMERON) tablet 7.5 mg  7.5 mg Oral Daily Agbata, Tochukwu, MD   Stopped at 09/06/19 1000  . omega-3 acid ethyl esters (LOVAZA) capsule 2 g  2 capsule Oral  Daily Collier Bullock, MD   Stopped at 09/06/19 1000  . ondansetron (ZOFRAN) tablet 4 mg  4 mg Oral Q6H PRN Agbata, Tochukwu, MD       Or  . ondansetron (ZOFRAN) injection 4 mg  4 mg Intravenous Q6H PRN Agbata, Tochukwu, MD      . pantoprazole (PROTONIX) EC tablet 40 mg  40 mg Oral Daily Agbata, Tochukwu, MD   Stopped at 09/06/19 1000  . polyvinyl alcohol (LIQUIFILM TEARS) 1.4 % ophthalmic solution 1 drop  1 drop Both Eyes BID [name], RPH   Stopped at 09/06/19 1000  . rOPINIRole (REQUIP) tablet 0.25 mg  0.25 mg Oral QHS Agbata, Tochukwu, MD   0.25 mg at 09/05/19 2133  . sevelamer carbonate (RENVELA) tablet 800 mg  800 mg Oral TID WC Agbata, Tochukwu, MD   800 mg at 09/06/19 1218  . sodium chloride flush (NS) 0.9 % injection 3 mL  3 mL Intravenous Once Arta Silence, MD      . timolol (TIMOPTIC) 0.5 % ophthalmic solution 1 drop  1 drop Both Eyes TID Collier Bullock, MD   Stopped at 09/06/19 1000  . traMADol (ULTRAM) tablet 50 mg  50 mg Oral Q8H PRN Agbata, Tochukwu, MD   50 mg at 09/04/19 2156  . vitamin B-12 (CYANOCOBALAMIN) tablet 1,000 mcg  1,000 mcg Oral  Daily Agbata, Tochukwu, MD   Stopped at 09/06/19 1000     Discharge Medications: STOP taking these medications       HYDROcodone-acetaminophen 5-325 MG tablet Commonly known as: NORCO/VICODIN             TAKE these medications       apixaban 2.5 MG Tabs tablet Commonly known as: ELIQUIS Take 1 tablet (2.5 mg total) by mouth 2 (two) times daily.   aspirin EC 81 MG tablet Take 1 tablet (81 mg total) by mouth daily.   atorvastatin 80 MG tablet Commonly known as: LIPITOR Take 80 mg by mouth at bedtime.   brimonidine 0.2 % ophthalmic solution Commonly known as: ALPHAGAN Place 1 drop into both eyes 3 (three) times daily.   carvedilol 25 MG tablet Commonly known as: COREG Take 1 tablet (25 mg total) by mouth 2 (two) times daily.   cloNIDine 0.1 MG tablet Commonly known as: CATAPRES Take 0.1 mg by mouth daily as needed (BP >160/80).   fluticasone 50 MCG/ACT nasal spray Commonly known as: FLONASE Place 2 sprays into both nostrils daily.   insulin aspart 100 UNIT/ML injection Commonly known as: novoLOG Inject 0-6 Units into the skin See admin instructions. Inject under the skin according to blood glucose reading:  <201: 0u 201- 250: 2u 251-300: 4u 301-350: 5u 351-400: 6u   insulin glargine 100 UNIT/ML injection Commonly known as: LANTUS Inject 5 Units into the skin daily in the afternoon.   ipratropium 0.06 % nasal spray Commonly known as: ATROVENT Place 2 sprays into both nostrils 2 (two) times daily.   isosorbide mononitrate 30 MG 24 hr tablet Commonly known as: IMDUR Take 30 mg by mouth daily.   levETIRAcetam 500 MG tablet Commonly known as: KEPPRA Take 1 tablet (500 mg total) by mouth 2 (two) times daily. What changed: Another medication with the same name was changed. Make sure you understand how and when to take each.   levETIRAcetam 250 MG tablet Commonly known as: KEPPRA Three times weekly after dialysis What changed:   how much  to take  how to take this  when to take  this  additional instructions   meclizine 25 MG tablet Commonly known as: ANTIVERT Take 25 mg by mouth every 4 (four) hours as needed for dizziness.   mirtazapine 15 MG tablet Commonly known as: REMERON Take 7.5 mg by mouth daily.   Omega-3 1000 MG Caps Take 2 capsules by mouth daily.   pantoprazole 40 MG tablet Commonly known as: PROTONIX Take 1 tablet (40 mg total) by mouth daily.   Refresh 1.4-0.6 % Soln Generic drug: Polyvinyl Alcohol-Povidone PF Place 1 drop into both eyes 2 (two) times a day.   rOPINIRole 0.25 MG tablet Commonly known as: REQUIP Take 1 tablet (0.25 mg total) by mouth at bedtime. What changed: when to take this   sevelamer carbonate 800 MG tablet Commonly known as: RENVELA Take 800 mg by mouth 3 (three) times daily with meals.   timolol 0.5 % ophthalmic solution Commonly known as: TIMOPTIC Place 1 drop into both eyes 3 (three) times daily.   traMADol 50 MG tablet Commonly known as: ULTRAM Take 1 tablet (50 mg total) by mouth every 12 (twelve) hours as needed for severe pain. What changed:   when to take this  reasons to take this   vitamin B-12 1000 MCG tablet Commonly known as: CYANOCOBALAMIN Take 1 tablet (1,000 mcg total) by mouth daily.            Relevant Imaging Results:  Relevant Lab Results:   Additional Information SSn: [id number]  Victorino Dike, RN

## 2019-09-09 ENCOUNTER — Telehealth: Payer: Self-pay | Admitting: Family

## 2019-09-09 NOTE — Telephone Encounter (Signed)
Unable to reach patient regarding new patient appointment after her recent hospital discharge.    Stephanie Ellis

## 2019-09-14 ENCOUNTER — Encounter: Payer: Self-pay | Admitting: Family

## 2019-09-14 ENCOUNTER — Other Ambulatory Visit: Payer: Self-pay

## 2019-09-14 ENCOUNTER — Ambulatory Visit: Payer: Medicare Other | Attending: Family | Admitting: Family

## 2019-09-14 VITALS — BP 180/80 | HR 65 | Resp 18 | Ht <= 58 in | Wt 122.0 lb

## 2019-09-14 DIAGNOSIS — I2510 Atherosclerotic heart disease of native coronary artery without angina pectoris: Secondary | ICD-10-CM | POA: Diagnosis not present

## 2019-09-14 DIAGNOSIS — I132 Hypertensive heart and chronic kidney disease with heart failure and with stage 5 chronic kidney disease, or end stage renal disease: Secondary | ICD-10-CM | POA: Diagnosis present

## 2019-09-14 DIAGNOSIS — I5042 Chronic combined systolic (congestive) and diastolic (congestive) heart failure: Secondary | ICD-10-CM | POA: Insufficient documentation

## 2019-09-14 DIAGNOSIS — E785 Hyperlipidemia, unspecified: Secondary | ICD-10-CM | POA: Diagnosis not present

## 2019-09-14 DIAGNOSIS — I252 Old myocardial infarction: Secondary | ICD-10-CM | POA: Diagnosis not present

## 2019-09-14 DIAGNOSIS — Z7982 Long term (current) use of aspirin: Secondary | ICD-10-CM | POA: Diagnosis not present

## 2019-09-14 DIAGNOSIS — Z794 Long term (current) use of insulin: Secondary | ICD-10-CM | POA: Insufficient documentation

## 2019-09-14 DIAGNOSIS — Z89511 Acquired absence of right leg below knee: Secondary | ICD-10-CM | POA: Diagnosis not present

## 2019-09-14 DIAGNOSIS — Z79899 Other long term (current) drug therapy: Secondary | ICD-10-CM | POA: Diagnosis not present

## 2019-09-14 DIAGNOSIS — Z992 Dependence on renal dialysis: Secondary | ICD-10-CM | POA: Insufficient documentation

## 2019-09-14 DIAGNOSIS — Z89512 Acquired absence of left leg below knee: Secondary | ICD-10-CM | POA: Diagnosis not present

## 2019-09-14 DIAGNOSIS — Z7901 Long term (current) use of anticoagulants: Secondary | ICD-10-CM | POA: Insufficient documentation

## 2019-09-14 DIAGNOSIS — E7800 Pure hypercholesterolemia, unspecified: Secondary | ICD-10-CM | POA: Diagnosis not present

## 2019-09-14 DIAGNOSIS — K219 Gastro-esophageal reflux disease without esophagitis: Secondary | ICD-10-CM | POA: Insufficient documentation

## 2019-09-14 DIAGNOSIS — E1151 Type 2 diabetes mellitus with diabetic peripheral angiopathy without gangrene: Secondary | ICD-10-CM | POA: Insufficient documentation

## 2019-09-14 DIAGNOSIS — Z951 Presence of aortocoronary bypass graft: Secondary | ICD-10-CM | POA: Diagnosis not present

## 2019-09-14 DIAGNOSIS — N186 End stage renal disease: Secondary | ICD-10-CM | POA: Diagnosis not present

## 2019-09-14 DIAGNOSIS — E1122 Type 2 diabetes mellitus with diabetic chronic kidney disease: Secondary | ICD-10-CM | POA: Insufficient documentation

## 2019-09-14 DIAGNOSIS — N185 Chronic kidney disease, stage 5: Secondary | ICD-10-CM

## 2019-09-14 DIAGNOSIS — I10 Essential (primary) hypertension: Secondary | ICD-10-CM

## 2019-09-14 DIAGNOSIS — I5022 Chronic systolic (congestive) heart failure: Secondary | ICD-10-CM

## 2019-09-14 NOTE — Progress Notes (Signed)
[facility] - PHARMACIST COUNSELING NOTE  ADHERENCE ASSESSMENT  Adherence strategy: Patient resides at the [facility]. Facility administering medications. MAR reviewed.    Do you ever forget to take your medication? [] Yes (1) [x] No (0)  Do you ever skip doses due to side effects? [] Yes (1) [x] No (0)  Do you have trouble affording your medicines? [] Yes (1) [x] No (0)  Are you ever unable to pick up your medication due to transportation difficulties? [] Yes (1) [x] No (0)  Do you ever stop taking your medications because you don't believe they are helping? [] Yes (1) [x] No (0)  Total score 0    Recommendations given to patient about increasing adherence: None needed. Patient is currently residing at an assisted living facility. Will re-assess when / if patient returns to community living.  Guideline-Directed Medical Therapy/Evidence Based Medicine  ACE/ARB/ARNI: None. Hx of intolerance Beta Blocker: Carvedilol 25 mg BID Aldosterone Antagonist: Contraindicated Diuretic: None. Dialysis patient    SUBJECTIVE  HPI: Patient is a 69 y/o F with PMH as below who presents to CHF clinic for new patient visit.   Past Medical History:  Diagnosis Date  . (HFpEF) heart failure with preserved ejection fraction ([place])    a. 01/2018 Echo: EF 50-55%, no rwma, Gr1 DD, triv AI, mild MR. Midly dil LA/RV, mod reduced RV fxn. Irreg thickening of TV w/ mobile echodensity that appears to arise from valve.  . Anemia   . Anorexia   . Bacteremia due to Pseudomonas   . Carotid arterial disease ([place])    a. 01/2017 Carotid U/S: RICA <27, LICA <06.  Marland Kitchen Complication of anesthesia    a. Pt reports h/o complication on 9 different occasions - ? hypotension/arrest.  . 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 01/2015 Cath [facility]): 70% LM, p-mLAD 50-60% (Resting FFR 0.75), mRCA 80-90%, ~40 Ost OM & D1-->CABG; b. 01/2016 Staged PCI of LCX x  2 and RCA.  Marland Kitchen ESRD (end stage renal disease) on dialysis ([place])    a. ESRD secondary to acute kidney failure s/p CABG-->PD  . Essential hypertension   . GERD (gastroesophageal reflux disease)   . Heart murmur   . Hypercholesterolemia   . Myocardial infarction ([place]) 2016  . PVD (peripheral vascular disease) ([place])    a. 01/30/2018 PV Angio: Sev Left Popliteal dzs s/p PTA w/ 18m lutonix DEB w/ mech aspiration of the L popliteal, L AT, and Left tibioperoneal trunck and peroneal artery.  . 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  [facility]: Dr. BMarland KitchenHaithcock: CABG x 3, LIMA to LAD, SVG to RCA, SVG to OM3, EVH  . Sinusitis 2019  . Type II diabetes mellitus with complication (HCC)    CAD      OBJECTIVE   Vital signs: HR 65, BP 193/62, weight (pounds) 122 ECHO: Date 07/09/18, EF 35-40%, notes: Moderately increased LV wall thickness, impaired LV relaxation. LA mildly dilated. Cath: Date 02/19/16, notes: multivessel disease treated with PCI with DES to proximal LCx and OM2  BMP Latest Ref Rng & Units 09/04/2019 09/03/2019 09/02/2019  Glucose 70 - 99 mg/dL 127(H) 200(H) 247(H)  BUN 8 - 23 mg/dL 44(H) 42(H) 31(H)  Creatinine 0.44 - 1.00 mg/dL 6.82(H) 6.19(H) 5.35(H)  Sodium 135 - 145 mmol/L 137 139 136  Potassium 3.5 - 5.1 mmol/L 4.9 4.2 4.8  Chloride 98 - 111 mmol/L 96(L) 94(L) 93(L)  CO2 22 - 32 mmol/L 29 31 31   Calcium  8.9 - 10.3 mg/dL 8.6(L) 8.2(L) 8.3(L)    ASSESSMENT  Patient is well appearing and in no acute distress. She reports breathing is okay. She wears 2L oxygen chronically. Facility is administering medications. She denies adverse effects of therapy.   PLAN  1). HFrEF -GDMT with carvedilol 25 mg BID -Hx of intolerance to ACE-I / ARB, aldosterone antagonists contraindicated based on renal function -32 fluid oz fluid restriction -Fluid management with dialysis  2). Hypertension -Hypertensive today in clinic -Endorses signs/symptoms of hypertension including  headaches -Antihypertensive regimen includes carvedilol 25 mg BID, clonidine 0.1 mg PRN BP >160/80  3). Atrial fibrillation -Rate control with carvedilol 25 mg BID -Anticoagulation with apixaban 2.5 mg BID -Denies signs/symptoms of bleeding  4). CAD -S/p CABG + stenting -Atorvastatin 80 mg daily, aspirin 81 mg daily, Imdur 30 mg daily -Nitrates could contribute to patient reported headaches  5). T2DM -HgbA1c was 7.6% on 09/03/19 -Antihyperglycemic regimen includes Novolog on a sliding scale TIDAC + Lantus 5 units daily  6). ESRD on HD -Follows with nephrology -Dialysis MWF schedule -Phosphate on 09/04/19 5.8. Sevelamer 800 mg TID with food  7). GERD -Pantoprazole 40 mg daily  8). Hx of seizures -Keppra 500 mg BID + 250 mg TID after dialysis  9). Anxiety and depression -Mirtazapine 7.5 mg daily  10). Dyslipidemia -Atorvastatin 80 mg daily, fish oil 2 g daily -Lipid panel 07/08/18 with HDL 35, LDL 55, TG 313  Time spent: 15 minutes  [place] Resident 09/14/2019 1:37 PM    Current Outpatient Medications:  .  apixaban (ELIQUIS) 2.5 MG TABS tablet, Take 1 tablet (2.5 mg total) by mouth 2 (two) times daily., Disp: 60 tablet, Rfl: 0 .  aspirin EC 81 MG tablet, Take 1 tablet (81 mg total) by mouth daily., Disp: , Rfl:  .  atorvastatin (LIPITOR) 80 MG tablet, Take 80 mg by mouth at bedtime. , Disp: , Rfl:  .  brimonidine (ALPHAGAN) 0.2 % ophthalmic solution, Place 1 drop into both eyes 3 (three) times daily., Disp: 5 mL, Rfl: 12 .  carvedilol (COREG) 25 MG tablet, Take 1 tablet (25 mg total) by mouth 2 (two) times daily., Disp: 60 tablet, Rfl: 0 .  cloNIDine (CATAPRES) 0.1 MG tablet, Take 0.1 mg by mouth daily as needed (BP >160/80)., Disp: , Rfl:  .  fluticasone (FLONASE) 50 MCG/ACT nasal spray, Place 2 sprays into both nostrils daily., Disp: , Rfl:  .  insulin aspart (NOVOLOG) 100 UNIT/ML injection, Inject 0-6 Units into the skin See admin instructions. Inject  under the skin according to blood glucose reading:  <201: 0u 201- 250: 2u 251-300: 4u 301-350: 5u 351-400: 6u, Disp: , Rfl:  .  insulin glargine (LANTUS) 100 UNIT/ML injection, Inject 5 Units into the skin daily in the afternoon. , Disp: , Rfl:  .  ipratropium (ATROVENT) 0.06 % nasal spray, Place 2 sprays into both nostrils 2 (two) times daily., Disp: 15 mL, Rfl: 0 .  isosorbide mononitrate (IMDUR) 30 MG 24 hr tablet, Take 30 mg by mouth daily. , Disp: , Rfl:  .  levETIRAcetam (KEPPRA) 250 MG tablet, Three times weekly after dialysis (Patient taking differently: Take 250 mg by mouth Every Tuesday,Thursday,and Saturday with dialysis. ), Disp: , Rfl:  .  levETIRAcetam (KEPPRA) 500 MG tablet, Take 1 tablet (500 mg total) by mouth 2 (two) times daily., Disp: , Rfl:  .  meclizine (ANTIVERT) 25 MG tablet, Take 25 mg by mouth every 4 (four) hours as needed for  dizziness., Disp: , Rfl:  .  mirtazapine (REMERON) 15 MG tablet, Take 7.5 mg by mouth daily. , Disp: , Rfl:  .  Omega-3 1000 MG CAPS, Take 2 capsules by mouth daily. , Disp: , Rfl:  .  pantoprazole (PROTONIX) 40 MG tablet, Take 1 tablet (40 mg total) by mouth daily., Disp: 30 tablet, Rfl: 11 .  Polyvinyl Alcohol-Povidone PF (REFRESH) 1.4-0.6 % SOLN, Place 1 drop into both eyes 2 (two) times a day. , Disp: , Rfl:  .  rOPINIRole (REQUIP) 0.25 MG tablet, Take 1 tablet (0.25 mg total) by mouth at bedtime. (Patient taking differently: Take 0.25 mg by mouth daily. ), Disp: , Rfl:  .  sevelamer carbonate (RENVELA) 800 MG tablet, Take 800 mg by mouth 3 (three) times daily with meals. , Disp: , Rfl:  .  timolol (TIMOPTIC) 0.5 % ophthalmic solution, Place 1 drop into both eyes 3 (three) times daily., Disp: 10 mL, Rfl: 12 .  traMADol (ULTRAM) 50 MG tablet, Take 1 tablet (50 mg total) by mouth every 12 (twelve) hours as needed for severe pain., Disp: 10 tablet, Rfl: 0 .  vitamin B-12 (CYANOCOBALAMIN) 1000 MCG tablet, Take 1 tablet (1,000 mcg total) by mouth  daily., Disp: 30 tablet, Rfl: 0   COUNSELING POINTS/CLINICAL PEARLS  Carvedilol (Goal: weight less than 85 kg is 25 mg BID, weight greater than 85 kg is 50 mg BID)  Patient should avoid activities requiring coordination until drug effects are realized, as drug may cause dizziness.  This drug may cause diarrhea, nausea, vomiting, arthralgia, back pain, myalgia, headache, vision disorder, erectile dysfunction, reduced libido, or fatigue.  Instruct patient to report signs/symptoms of adverse cardiovascular effects such as hypotension (especially in elderly patients), arrhythmias, syncope, palpitations, angina, or edema.  Drug may mask symptoms of hypoglycemia. Advise diabetic patients to carefully monitor blood sugar levels.  Patient should take drug with food.  Advise patient against sudden discontinuation of drug.  DRUGS TO AVOID IN HEART FAILURE  Drug or Class Mechanism  Analgesics . NSAIDs . COX-2 inhibitors . Glucocorticoids  Sodium and water retention, increased systemic vascular resistance, decreased response to diuretics   Diabetes Medications . Metformin . Thiazolidinediones o Rosiglitazone (Avandia) o Pioglitazone (Actos) . DPP4 Inhibitors o Saxagliptin (Onglyza) o Sitagliptin (Januvia)   Lactic acidosis Possible calcium channel blockade   Unknown  Antiarrhythmics . Class I  o Flecainide o Disopyramide . Class III o Sotalol . Other o Dronedarone  Negative inotrope, proarrhythmic   Proarrhythmic, beta blockade  Negative inotrope  Antihypertensives . Alpha Blockers o Doxazosin . Calcium Channel Blockers o Diltiazem o Verapamil o Nifedipine . Central Alpha Adrenergics o Moxonidine . Peripheral Vasodilators o Minoxidil  Increases renin and aldosterone  Negative inotrope    Possible sympathetic withdrawal  Unknown  Anti-infective . Itraconazole . Amphotericin B  Negative inotrope Unknown  Hematologic . Anagrelide . Cilostazol   Possible  inhibition of PD IV Inhibition of PD III causing arrhythmias  Neurologic/Psychiatric . Stimulants . Anti-Seizure Drugs o Carbamazepine o Pregabalin . Antidepressants o Tricyclics o Citalopram . Parkinsons o Bromocriptine o Pergolide o Pramipexole . Antipsychotics o Clozapine . Antimigraine o Ergotamine o Methysergide . Appetite suppressants . Bipolar o Lithium  Peripheral alpha and beta agonist activity  Negative inotrope and chronotrope Calcium channel blockade  Negative inotrope, proarrhythmic Dose-dependent QT prolongation  Excessive serotonin activity/valvular damage Excessive serotonin activity/valvular damage Unknown  IgE mediated hypersensitivy, calcium channel blockade  Excessive serotonin activity/valvular damage Excessive serotonin activity/valvular damage Valvular damage  Direct myofibrillar degeneration, adrenergic stimulation  Antimalarials . Chloroquine . Hydroxychloroquine Intracellular inhibition of lysosomal enzymes  Urologic Agents . Alpha Blockers o Doxazosin o Prazosin o Tamsulosin o Terazosin  Increased renin and aldosterone  Adapted from Page RL, et al. "Drugs That May Cause or Exacerbate Heart Failure: A Scientific Statement from the [employer]." Circulation 2016; 409:W11-B14. DOI: 10.1161/CIR.0000000000000426   MEDICATION ADHERENCES TIPS AND STRATEGIES 1. Taking medication as prescribed improves patient outcomes in heart failure (reduces hospitalizations, improves symptoms, increases survival) 2. Side effects of medications can be managed by decreasing doses, switching agents, stopping drugs, or adding additional therapy. Please let someone in the [facility] know if you have having bothersome side effects so we can modify your regimen. Do not alter your medication regimen without talking to [place].  3. Medication reminders can help patients remember to take drugs on time. If you are missing or forgetting doses  you can try linking behaviors, using pill boxes, or an electronic reminder like an alarm on your phone or an app. Some people can also get automated phone calls as medication reminders.

## 2019-09-14 NOTE — Patient Instructions (Signed)
Continue weighing daily and call for an overnight weight gain of > 2 pounds or a weekly weight gain of >5 pounds. 

## 2019-09-14 NOTE — Progress Notes (Signed)
Patient ID: Stephanie Ellis, female    DOB: Sep 15, 1950, 69 y.o.   MRN: [id number]  HPI  Ms Trick is a 69 y/o female with a history of carotid disease, CAD, DM, hyperlipidemia, HTN, CKD (ESRD), anemia, GERD, PVD and chronic heart failure.   Echo report from 07/09/2018 reviewed and showed an EF of 35-40% along with moderate LVH.   Admitted 09/02/19 due to chest pain and shortness of breath. Abdominal ultrasound performed which showed small amount of ascites. Dialysis was needed. Allergic to ACEi and ARB's. Has history of syncope during dialysis sessions. Discharged after 4 days.   She presents today for her initial visit with a chief complaint of moderate fatigue upon minimal exertion. She describes this as chronic in nature having been present for several years. She has associated shortness of breath, light-headedness (especially during dialysis), anxiety, chronic difficulty sleeping and lower leg pain. She denies any abdominal distention, palpitations, pedal edema, chest pain, cough or weight gain.   Past Medical History:  Diagnosis Date  . (HFpEF) heart failure with preserved ejection fraction ([place])    a. 01/2018 Echo: EF 50-55%, no rwma, Gr1 DD, triv AI, mild MR. Midly dil LA/RV, mod reduced RV fxn. Irreg thickening of TV w/ mobile echodensity that appears to arise from valve.  . Anemia   . Anorexia   . Bacteremia due to Pseudomonas   . Carotid arterial disease ([place])    a. 01/2017 Carotid U/S: RICA <81, LICA <44.  Marland Kitchen CHF (congestive heart failure) ([place])   . Complication of anesthesia    a. Pt reports h/o complication on 9 different occasions - ? hypotension/arrest.  . 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 01/2015 Cath [facility]): 70% LM, p-mLAD 50-60% (Resting FFR 0.75), mRCA 80-90%, ~40 Ost OM & D1-->CABG; b. 01/2016 Staged PCI of LCX x 2 and RCA.  Marland Kitchen ESRD (end stage renal disease) on dialysis ([place])    a. ESRD secondary to acute kidney failure s/p CABG-->PD   . Essential hypertension   . GERD (gastroesophageal reflux disease)   . Heart murmur   . Hypercholesterolemia   . Myocardial infarction ([place]) 2016  . PVD (peripheral vascular disease) ([place])    a. 01/30/2018 PV Angio: Sev Left Popliteal dzs s/p PTA w/ 61m lutonix DEB w/ mech aspiration of the L popliteal, L AT, and Left tibioperoneal trunck and peroneal artery.  . 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  [facility]: Dr. BMarland KitchenHaithcock: CABG x 3, LIMA to LAD, SVG to RCA, SVG to OM3, EVH  . Sinusitis 2019  . Type II diabetes mellitus with complication (HCC)    CAD   Past Surgical History:  Procedure Laterality Date  . ABDOMINAL HYSTERECTOMY    . AMPUTATION Right 02/25/2018   Procedure: AMPUTATION BELOW KNEE;  Surgeon: SKatha Cabal MD;  Location: [facility];  Service: Vascular;  Laterality: Right;  . AMPUTATION Left 03/11/2018   Procedure: AMPUTATION BELOW KNEE;  Surgeon: DAlgernon Huxley MD;  Location: [facility];  Service: Vascular;  Laterality: Left;  . AMPUTATION Bilateral 04/13/2018   Procedure: REVISION OF BILATERAL AMPUTATIONS BELOW KNEE WITH WOUND VAC APPLICATIONS;  Surgeon: EEvaristo Bury MD;  Location: [facility];  Service: Vascular;  Laterality: Bilateral;  . AMPUTATION TOE Left 02/06/2018   Procedure: AMPUTATION GREAT TOE;  Surgeon: FSamara Deist DPM;  Location: [facility];  Service: Podiatry;  Laterality: Left;  . APPLICATION OF WOUND VAC Left 04/13/2018   Procedure: APPLICATION OF  WOUND VAC;  Surgeon: Evaristo Bury, MD;  Location: [facility];  Service: Vascular;  Laterality: Left;  . ARTERIOVENOUS GRAFT PLACEMENT  05/2016  . AV FISTULA PLACEMENT Left 05/30/2016   Procedure: ARTERIOVENOUS graft;  Surgeon: Algernon Huxley, MD;  Location: [facility];  Service: Vascular;  Laterality: Left;  . CAPD INSERTION N/A 10/30/2017   Procedure: LAPAROSCOPIC INSERTION CONTINUOUS AMBULATORY PERITONEAL DIALYSIS  (CAPD) CATHETER;  Surgeon: Algernon Huxley, MD;  Location: [facility];  Service: Vascular;  Laterality: N/A;   . CARDIAC CATHETERIZATION  01/2015   [facility]: Ost LM 70%, p-m LAD 50-60% (Rest FFR + @ 0.75), mRCA 80-90%, ostD1 40%, pOM1 40%  . CATARACT EXTRACTION W/PHACO Left 01/18/2016   Procedure: CATARACT EXTRACTION PHACO AND INTRAOCULAR LENS PLACEMENT (IOC);  Surgeon: Eulogio Bear, MD;  Location: [facility];  Service: Ophthalmology;  Laterality: Left;  [place] 1.05 AP% 15.5 CDE 10.16 Fluid Pack Lot # [id number]  . CATARACT EXTRACTION W/PHACO Right 08/01/2016   Procedure: CATARACT EXTRACTION PHACO AND INTRAOCULAR LENS PLACEMENT (IOC);  Surgeon: Eulogio Bear, MD;  Location: [facility];  Service: Ophthalmology;  Laterality: Right;  [place] 01:00.6 AP% 11.4 CDE 6.93  LOT # [id number]  . COLONOSCOPY    . CORONARY ANGIOPLASTY     SENTS 02/12/16  . CORONARY ARTERY BYPASS GRAFT  03/28/15    [facility]: Dr. Waldemar Dickens: LIMA to LAD, SVG to RCA, SVG to OM3, EVH  . DIALYSIS/PERMA CATHETER INSERTION N/A 02/11/2018   Procedure: DIALYSIS/PERMA CATHETER INSERTION;  Surgeon: Algernon Huxley, MD;  Location: [place] [facility];  Service: Cardiovascular;  Laterality: N/A;  . DIALYSIS/PERMA CATHETER REMOVAL Right 02/04/2018   Procedure: DIALYSIS/PERMA CATHETER REMOVAL;  Surgeon: Katha Cabal, MD;  Location: [place] [facility];  Service: Cardiovascular;  Laterality: Right;  . ESOPHAGOGASTRODUODENOSCOPY (EGD) WITH PROPOFOL N/A 10/24/2016   Procedure: ESOPHAGOGASTRODUODENOSCOPY (EGD) WITH PROPOFOL;  Surgeon: [name], MD;  Location: [facility];  Service: Endoscopy;  Laterality: N/A;  . EYE SURGERY Bilateral    cataract surgery  . EYE SURGERY     drains for glaucoma  . INSERTION EXPRESS TUBE SHUNT Right 08/01/2016   Procedure: INSERTION EXPRESS TUBE SHUNT;  Surgeon: Eulogio Bear, MD;  Location: [facility];  Service: Ophthalmology;  Laterality: Right;  . INSERTION OF AHMED VALVE Left 08/15/2016   Procedure: INSERTION OF AHMED VALVE;  Surgeon: Eulogio Bear, MD;  Location: [facility];  Service: Ophthalmology;  Laterality:  Left;  . INSERTION OF DIALYSIS CATHETER    . LOWER EXTREMITY ANGIOGRAPHY Right 02/19/2018   Procedure: Lower Extremity Angiography;  Surgeon: Algernon Huxley, MD;  Location: [place] [facility];  Service: Cardiovascular;  Laterality: Right;  . LOWER EXTREMITY ANGIOGRAPHY Left 01/30/2018   Procedure: Lower Extremity Angiography;  Surgeon: Katha Cabal, MD;  Location: [place] [facility];  Service: Cardiovascular;  Laterality: Left;  . REMOVAL OF A DIALYSIS CATHETER N/A 02/25/2018   Procedure: REMOVAL OF A DIALYSIS CATHETER;  Surgeon: Katha Cabal, MD;  Location: [facility];  Service: Vascular;  Laterality: N/A;  . TEMPORARY DIALYSIS CATHETER N/A 02/06/2018   Procedure: TEMPORARY DIALYSIS CATHETER;  Surgeon: Katha Cabal, MD;  Location: [place] [facility];  Service: Cardiovascular;  Laterality: N/A;  . TRANSTHORACIC ECHOCARDIOGRAM  January 2017    EF 60-65%. GR 2 DD. Mild degenerative mitral valve disease but no prolapse or regurgitation. Mild left atrial dilation. Mild to moderate LVH. Pericardial effusion gone  . UPPER EXTREMITY ANGIOGRAPHY Left 06/27/2016   Procedure:  Upper Extremity Angiography;  Surgeon: Algernon Huxley, MD;  Location: [place] [facility];  Service: Cardiovascular;  Laterality: Left;  . UPPER EXTREMITY ANGIOGRAPHY Left 08/22/2016   Procedure: Upper Extremity Angiography;  Surgeon: Algernon Huxley, MD;  Location: [place] [facility];  Service: Cardiovascular;  Laterality: Left;  Marland Kitchen VASCULAR SURGERY    . WOUND DEBRIDEMENT Left 05/08/2018   Procedure: DEBRIDEMENT OF LEFT LEG BKA;  Surgeon: Algernon Huxley, MD;  Location: [facility];  Service: General;  Laterality: Left;   Family History  Problem Relation Age of Onset  . Diabetes Mellitus II Mother   . Heart failure Mother   . Pancreatic cancer Father    Social History   Tobacco Use  . Smoking status: Never Smoker  . Smokeless tobacco: Never Used  Substance Use Topics  . Alcohol use: No   Allergies  Allergen  Reactions  . Chlorthalidone Anaphylaxis, Itching and Rash  . Fentanyl Rash  . Midazolam Rash  . Ace Inhibitors Other (See Comments)    Reaction:  Hyperkalemia, agitation   . Angiotensin Receptor Blockers Other (See Comments)    Reaction:  Hyperkalemia, agitation   . Norvasc [Amlodipine] Itching and Rash  . Phenergan [Promethazine Hcl] Anxiety    "antsy, can't sit still"   Prior to Admission medications   Medication Sig Start Date End Date Taking? Authorizing Provider  apixaban (ELIQUIS) 2.5 MG TABS tablet Take 1 tablet (2.5 mg total) by mouth 2 (two) times daily. 02/15/18  Yes Vaughan Basta, MD  aspirin EC 81 MG tablet Take 1 tablet (81 mg total) by mouth daily. 10/19/16  Yes Dustin Flock, MD  atorvastatin (LIPITOR) 80 MG tablet Take 80 mg by mouth at bedtime.  05/05/17  Yes [provider]  brimonidine (ALPHAGAN) 0.2 % ophthalmic solution Place 1 drop into both eyes 3 (three) times daily. 03/18/18  Yes Mody, Ulice Bold, MD  carvedilol (COREG) 25 MG tablet Take 1 tablet (25 mg total) by mouth 2 (two) times daily. 07/13/18  Yes Ojie, Jude, MD  cloNIDine (CATAPRES) 0.1 MG tablet Take 0.1 mg by mouth daily as needed (BP >160/80).   Yes [provider]  fluticasone (FLONASE) 50 MCG/ACT nasal spray Place 2 sprays into both nostrils daily.   Yes [provider]  insulin aspart (NOVOLOG) 100 UNIT/ML injection Inject 0-6 Units into the skin See admin instructions. Inject under the skin according to blood glucose reading:  <201: 0u 201- 250: 2u 251-300: 4u 301-350: 5u 351-400: 6u   Yes [provider]  insulin glargine (LANTUS) 100 UNIT/ML injection Inject 5 Units into the skin daily in the afternoon.    Yes [provider]  ipratropium (ATROVENT) 0.06 % nasal spray Place 2 sprays into both nostrils 2 (two) times daily. 09/05/19  Yes Wieting, Richard, MD  isosorbide mononitrate (IMDUR) 30 MG 24 hr tablet Take 30 mg by mouth daily.    Yes [provider]  levETIRAcetam (KEPPRA) 250 MG tablet Three times weekly after dialysis Patient taking differently: Take 250 mg by mouth Every Tuesday,Thursday,and Saturday with dialysis.  05/12/18  Yes Loletha Grayer, MD  levETIRAcetam (KEPPRA) 500 MG tablet Take 1 tablet (500 mg total) by mouth 2 (two) times daily. 03/23/18  Yes Gouru, Illene Silver, MD  meclizine (ANTIVERT) 25 MG tablet Take 25 mg by mouth every 4 (four) hours as needed for dizziness.   Yes [provider]  mirtazapine (REMERON) 15 MG tablet Take 7.5 mg by mouth daily.    Yes  [provider]  Omega-3 1000 MG CAPS Take 2 capsules by mouth daily.    Yes [provider]  pantoprazole (PROTONIX) 40 MG tablet Take 1 tablet (40 mg total) by mouth daily. 06/04/19  Yes [name], MD  Polyvinyl Alcohol-Povidone PF (REFRESH) 1.4-0.6 % SOLN Place 1 drop into both eyes 2 (two) times a day.    Yes [provider]  rOPINIRole (REQUIP) 0.25 MG tablet Take 1 tablet (0.25 mg total) by mouth at bedtime. Patient taking differently: Take 0.25 mg by mouth daily.  05/12/18  Yes Wieting, Richard, MD  sevelamer carbonate (RENVELA) 800 MG tablet Take 800 mg by mouth 3 (three) times daily with meals.    Yes [provider]  timolol (TIMOPTIC) 0.5 % ophthalmic solution Place 1 drop into both eyes 3 (three) times daily. 03/18/18  Yes Mody, Ulice Bold, MD  traMADol (ULTRAM) 50 MG tablet Take 1 tablet (50 mg total) by mouth every 12 (twelve) hours as needed for severe pain. 09/05/19  Yes Wieting, Richard, MD  vitamin B-12 (CYANOCOBALAMIN) 1000 MCG tablet Take 1 tablet (1,000 mcg total) by mouth daily. 02/15/18  Yes Vaughan Basta, MD    Review of Systems  Constitutional: Positive for fatigue (tire easily). Negative for appetite change.  HENT: Negative for congestion, postnasal drip and sore throat.   Eyes: Negative.   Respiratory: Positive for shortness of breath. Negative for cough and chest tightness.    Cardiovascular: Negative for chest pain, palpitations and leg swelling.  Gastrointestinal: Negative for abdominal distention and abdominal pain.  Endocrine: Negative.   Genitourinary: Negative.   Musculoskeletal: Positive for arthralgias (lower leg phantom pain).  Allergic/Immunologic: Negative.   Neurological: Positive for light-headedness. Negative for dizziness.  Hematological: Negative for adenopathy. Does not bruise/bleed easily.  Psychiatric/Behavioral: Positive for sleep disturbance (chronically doesn't sleep well). Negative for dysphoric mood. The patient is nervous/anxious.    Vitals:   09/14/19 1340 09/14/19 1408  BP: (!) 193/62 (!) 180/80  Pulse: 65   Resp: 18   SpO2: 100%   Weight: 122 lb (55.3 kg)   Height: 4' 4"  (1.321 m)    Wt Readings from Last 3 Encounters:  09/14/19 122 lb (55.3 kg)  09/06/19 124 lb 9 oz (56.5 kg)  05/02/19 122 lb 5.7 oz (55.5 kg)     Lab Results  Component Value Date   CREATININE 6.82 (H) 09/04/2019   CREATININE 6.19 (H) 09/03/2019   CREATININE 5.35 (H) 09/02/2019    Physical Exam Vitals and nursing note reviewed.  Constitutional:      Appearance: Normal appearance.  HENT:     Head: Normocephalic and atraumatic.  Cardiovascular:     Rate and Rhythm: Normal rate and regular rhythm.  Pulmonary:     Effort: Pulmonary effort is normal. No respiratory distress.     Breath sounds: No wheezing.  Abdominal:     General: There is no distension.     Palpations: Abdomen is soft.  Musculoskeletal:        General: Deformity (bilateral BKA's) present. No swelling.     Cervical back: Normal range of motion and neck supple.  Skin:    General: Skin is warm and dry.  Neurological:     General: No focal deficit present.     Mental Status: She is alert and oriented to person, place, and time.  Psychiatric:        Mood and Affect: Mood normal.        Behavior: Behavior normal.  Assessment & Plan:  1: Chronic heart failure with reduced  ejection fraction- - NYHA class III - euvolemic today - getting weighed at dialysis three days/ week as patient has bilateral BKA's - not adding salt to her food and says that she tries to follow a low sodium diet - BNP 09/02/19 was >4500.0 - PharmD reconciled facility medication list  2: HTN- - BP elevated and was still elevated upon recheck with a manual cuff - has history of syncopal events during dialysis so nephrology likes to have her BP's on the high side - BMP 09/04/19 reviewed and showed sodium 137, potassium 4.9, creatinine 6.82 and GFR 7  3: CKD with ESRD- - dialysis dependent on M, W, F - saw vascular ([place]) 04/02/2019  4: DM- - A1c 09/03/19 was 7.6% - nonfasting glucose at home today was 188   Patient did not bring her medications nor a list. Each medication was verbally reviewed with the patient and she was encouraged to bring the bottles to every visit to confirm accuracy of list.  Return in 3 months or sooner for any questions/problems before then.

## 2019-11-03 ENCOUNTER — Emergency Department: Payer: Medicare Other

## 2019-11-03 ENCOUNTER — Other Ambulatory Visit: Payer: Self-pay

## 2019-11-03 ENCOUNTER — Inpatient Hospital Stay
Admission: EM | Admit: 2019-11-03 | Discharge: 2019-11-05 | DRG: 304 | Disposition: A | Discharge status: Home / Self Care | Payer: Medicare Other | Attending: Internal Medicine | Admitting: Internal Medicine

## 2019-11-03 DIAGNOSIS — E785 Hyperlipidemia, unspecified: Secondary | ICD-10-CM | POA: Diagnosis present

## 2019-11-03 DIAGNOSIS — Z794 Long term (current) use of insulin: Secondary | ICD-10-CM

## 2019-11-03 DIAGNOSIS — Z89511 Acquired absence of right leg below knee: Secondary | ICD-10-CM | POA: Diagnosis present

## 2019-11-03 DIAGNOSIS — N186 End stage renal disease: Secondary | ICD-10-CM | POA: Diagnosis not present

## 2019-11-03 DIAGNOSIS — N2581 Secondary hyperparathyroidism of renal origin: Secondary | ICD-10-CM | POA: Diagnosis present

## 2019-11-03 DIAGNOSIS — Z800 Family history of malignant neoplasm of digestive organs: Secondary | ICD-10-CM

## 2019-11-03 DIAGNOSIS — Z6831 Body mass index (BMI) 31.0-31.9, adult: Secondary | ICD-10-CM

## 2019-11-03 DIAGNOSIS — R079 Chest pain, unspecified: Secondary | ICD-10-CM

## 2019-11-03 DIAGNOSIS — D631 Anemia in chronic kidney disease: Secondary | ICD-10-CM | POA: Diagnosis present

## 2019-11-03 DIAGNOSIS — Z8249 Family history of ischemic heart disease and other diseases of the circulatory system: Secondary | ICD-10-CM

## 2019-11-03 DIAGNOSIS — I5042 Chronic combined systolic (congestive) and diastolic (congestive) heart failure: Secondary | ICD-10-CM | POA: Diagnosis present

## 2019-11-03 DIAGNOSIS — J449 Chronic obstructive pulmonary disease, unspecified: Secondary | ICD-10-CM | POA: Diagnosis present

## 2019-11-03 DIAGNOSIS — R778 Other specified abnormalities of plasma proteins: Secondary | ICD-10-CM | POA: Diagnosis not present

## 2019-11-03 DIAGNOSIS — I252 Old myocardial infarction: Secondary | ICD-10-CM

## 2019-11-03 DIAGNOSIS — J9611 Chronic respiratory failure with hypoxia: Secondary | ICD-10-CM | POA: Diagnosis present

## 2019-11-03 DIAGNOSIS — I132 Hypertensive heart and chronic kidney disease with heart failure and with stage 5 chronic kidney disease, or end stage renal disease: Secondary | ICD-10-CM | POA: Diagnosis present

## 2019-11-03 DIAGNOSIS — Z20822 Contact with and (suspected) exposure to covid-19: Secondary | ICD-10-CM | POA: Diagnosis present

## 2019-11-03 DIAGNOSIS — I2510 Atherosclerotic heart disease of native coronary artery without angina pectoris: Secondary | ICD-10-CM | POA: Diagnosis not present

## 2019-11-03 DIAGNOSIS — Z888 Allergy status to other drugs, medicaments and biological substances status: Secondary | ICD-10-CM

## 2019-11-03 DIAGNOSIS — Z7982 Long term (current) use of aspirin: Secondary | ICD-10-CM

## 2019-11-03 DIAGNOSIS — G40909 Epilepsy, unspecified, not intractable, without status epilepticus: Secondary | ICD-10-CM | POA: Diagnosis present

## 2019-11-03 DIAGNOSIS — I459 Conduction disorder, unspecified: Secondary | ICD-10-CM | POA: Diagnosis present

## 2019-11-03 DIAGNOSIS — E1169 Type 2 diabetes mellitus with other specified complication: Secondary | ICD-10-CM | POA: Diagnosis present

## 2019-11-03 DIAGNOSIS — I739 Peripheral vascular disease, unspecified: Secondary | ICD-10-CM | POA: Diagnosis present

## 2019-11-03 DIAGNOSIS — E1151 Type 2 diabetes mellitus with diabetic peripheral angiopathy without gangrene: Secondary | ICD-10-CM | POA: Diagnosis present

## 2019-11-03 DIAGNOSIS — Z89512 Acquired absence of left leg below knee: Secondary | ICD-10-CM

## 2019-11-03 DIAGNOSIS — I248 Other forms of acute ischemic heart disease: Secondary | ICD-10-CM | POA: Diagnosis present

## 2019-11-03 DIAGNOSIS — E669 Obesity, unspecified: Secondary | ICD-10-CM | POA: Diagnosis present

## 2019-11-03 DIAGNOSIS — E1122 Type 2 diabetes mellitus with diabetic chronic kidney disease: Secondary | ICD-10-CM | POA: Diagnosis present

## 2019-11-03 DIAGNOSIS — Z992 Dependence on renal dialysis: Secondary | ICD-10-CM

## 2019-11-03 DIAGNOSIS — R519 Headache, unspecified: Secondary | ICD-10-CM | POA: Diagnosis present

## 2019-11-03 DIAGNOSIS — I160 Hypertensive urgency: Principal | ICD-10-CM | POA: Diagnosis present

## 2019-11-03 DIAGNOSIS — I4510 Unspecified right bundle-branch block: Secondary | ICD-10-CM | POA: Diagnosis present

## 2019-11-03 DIAGNOSIS — R569 Unspecified convulsions: Secondary | ICD-10-CM

## 2019-11-03 DIAGNOSIS — Z9981 Dependence on supplemental oxygen: Secondary | ICD-10-CM

## 2019-11-03 DIAGNOSIS — Z833 Family history of diabetes mellitus: Secondary | ICD-10-CM

## 2019-11-03 DIAGNOSIS — I10 Essential (primary) hypertension: Secondary | ICD-10-CM | POA: Diagnosis present

## 2019-11-03 DIAGNOSIS — K219 Gastro-esophageal reflux disease without esophagitis: Secondary | ICD-10-CM | POA: Diagnosis present

## 2019-11-03 DIAGNOSIS — Z7902 Long term (current) use of antithrombotics/antiplatelets: Secondary | ICD-10-CM

## 2019-11-03 DIAGNOSIS — Z79899 Other long term (current) drug therapy: Secondary | ICD-10-CM

## 2019-11-03 DIAGNOSIS — Z951 Presence of aortocoronary bypass graft: Secondary | ICD-10-CM

## 2019-11-03 LAB — BASIC METABOLIC PANEL
Anion gap: 16 — ABNORMAL HIGH (ref 5–15)
BUN: 45 mg/dL — ABNORMAL HIGH (ref 8–23)
CO2: 27 mmol/L (ref 22–32)
Calcium: 8.7 mg/dL — ABNORMAL LOW (ref 8.9–10.3)
Chloride: 92 mmol/L — ABNORMAL LOW (ref 98–111)
Creatinine, Ser: 7.58 mg/dL — ABNORMAL HIGH (ref 0.44–1.00)
GFR calc Af Amer: 6 mL/min — ABNORMAL LOW (ref >60)
GFR calc non Af Amer: 5 mL/min — ABNORMAL LOW (ref >60)
Glucose, Bld: 144 mg/dL — ABNORMAL HIGH (ref 70–99)
Potassium: 4.6 mmol/L (ref 3.5–5.1)
Sodium: 135 mmol/L (ref 135–145)

## 2019-11-03 LAB — TROPONIN I (HIGH SENSITIVITY)
Troponin I (High Sensitivity): 58 ng/L — ABNORMAL HIGH (ref <18)
Troponin I (High Sensitivity): 58 ng/L — ABNORMAL HIGH (ref <18)

## 2019-11-03 LAB — CBC
HCT: 29.4 % — ABNORMAL LOW (ref 36.0–46.0)
Hemoglobin: 9 g/dL — ABNORMAL LOW (ref 12.0–15.0)
MCH: 31.1 pg (ref 26.0–34.0)
MCHC: 30.6 g/dL (ref 30.0–36.0)
MCV: 101.7 fL — ABNORMAL HIGH (ref 80.0–100.0)
Platelets: 157 10*3/uL (ref 150–400)
RBC: 2.89 MIL/uL — ABNORMAL LOW (ref 3.87–5.11)
RDW: 18.3 % — ABNORMAL HIGH (ref 11.5–15.5)
WBC: 5.5 10*3/uL (ref 4.0–10.5)
nRBC: 0 % (ref 0.0–0.2)

## 2019-11-03 LAB — PROTIME-INR
INR: 1.6 — ABNORMAL HIGH (ref 0.8–1.2)
Prothrombin Time: 18.3 seconds — ABNORMAL HIGH (ref 11.4–15.2)

## 2019-11-03 LAB — APTT: aPTT: 38 seconds — ABNORMAL HIGH (ref 24–36)

## 2019-11-03 LAB — GLUCOSE, CAPILLARY: Glucose-Capillary: 87 mg/dL (ref 70–99)

## 2019-11-03 LAB — SARS CORONAVIRUS 2 BY RT PCR (HOSPITAL ORDER, PERFORMED IN ~~LOC~~ HOSPITAL LAB): SARS Coronavirus 2: NEGATIVE

## 2019-11-03 MED ORDER — HEPARIN (PORCINE) 25000 UT/250ML-% IV SOLN
550.0000 [IU]/h | INTRAVENOUS | Status: DC
  Administered 2019-11-03: 450 [IU]/h via INTRAVENOUS
  Filled 2019-11-03: qty 250

## 2019-11-03 MED ORDER — VITAMIN B-12 1000 MCG PO TABS
1000.0000 ug | ORAL_TABLET | Freq: Every day | ORAL | Status: DC
  Administered 2019-11-04 – 2019-11-05 (×2): 1000 ug via ORAL
  Filled 2019-11-03 (×2): qty 1

## 2019-11-03 MED ORDER — ISOSORBIDE MONONITRATE ER 60 MG PO TB24
30.0000 mg | ORAL_TABLET | Freq: Every day | ORAL | Status: DC
  Administered 2019-11-03: 30 mg via ORAL
  Filled 2019-11-03 (×2): qty 1

## 2019-11-03 MED ORDER — CLONIDINE HCL 0.1 MG PO TABS
0.1000 mg | ORAL_TABLET | Freq: Every day | ORAL | Status: DC | PRN
  Administered 2019-11-04: 0.1 mg via ORAL
  Filled 2019-11-03: qty 1

## 2019-11-03 MED ORDER — CARVEDILOL 25 MG PO TABS
25.0000 mg | ORAL_TABLET | Freq: Two times a day (BID) | ORAL | Status: DC
  Administered 2019-11-03 – 2019-11-04 (×3): 25 mg via ORAL
  Filled 2019-11-03 (×3): qty 1

## 2019-11-03 MED ORDER — LEVETIRACETAM 250 MG PO TABS
250.0000 mg | ORAL_TABLET | ORAL | Status: DC
  Administered 2019-11-04: 250 mg via ORAL
  Filled 2019-11-03: qty 1

## 2019-11-03 MED ORDER — TIMOLOL MALEATE 0.5 % OP SOLN
1.0000 [drp] | Freq: Three times a day (TID) | OPHTHALMIC | Status: DC
  Administered 2019-11-03 – 2019-11-04 (×4): 1 [drp] via OPHTHALMIC
  Filled 2019-11-03: qty 5

## 2019-11-03 MED ORDER — INSULIN GLARGINE 100 UNIT/ML ~~LOC~~ SOLN
5.0000 [IU] | Freq: Every day | SUBCUTANEOUS | Status: DC
  Filled 2019-11-03 (×3): qty 0.05

## 2019-11-03 MED ORDER — ACETAMINOPHEN 325 MG PO TABS: 650.0000 mg | ORAL_TABLET | ORAL | Status: DC | PRN

## 2019-11-03 MED ORDER — MIRTAZAPINE 15 MG PO TABS
7.5000 mg | ORAL_TABLET | Freq: Every day | ORAL | Status: DC
  Administered 2019-11-03 – 2019-11-05 (×3): 7.5 mg via ORAL
  Filled 2019-11-03 (×3): qty 1

## 2019-11-03 MED ORDER — POLYETHYLENE GLYCOL 3350 17 G PO PACK: 17.0000 g | PACK | Freq: Every day | ORAL | Status: DC | PRN

## 2019-11-03 MED ORDER — BISACODYL 5 MG PO TBEC: 5.0000 mg | DELAYED_RELEASE_TABLET | Freq: Every day | ORAL | Status: DC | PRN

## 2019-11-03 MED ORDER — FLUTICASONE PROPIONATE 50 MCG/ACT NA SUSP
2.0000 | Freq: Every day | NASAL | Status: DC
  Administered 2019-11-03 – 2019-11-05 (×3): 2 via NASAL
  Filled 2019-11-03: qty 16

## 2019-11-03 MED ORDER — ROPINIROLE HCL 0.25 MG PO TABS
0.2500 mg | ORAL_TABLET | Freq: Every day | ORAL | Status: DC
  Administered 2019-11-03 – 2019-11-04 (×2): 0.25 mg via ORAL
  Filled 2019-11-03 (×3): qty 1

## 2019-11-03 MED ORDER — APIXABAN 2.5 MG PO TABS: 2.5000 mg | ORAL_TABLET | Freq: Two times a day (BID) | ORAL | Status: DC

## 2019-11-03 MED ORDER — ACETAMINOPHEN 500 MG PO TABS
1000.0000 mg | ORAL_TABLET | Freq: Once | ORAL | Status: AC
  Administered 2019-11-03: 1000 mg via ORAL
  Filled 2019-11-03: qty 2

## 2019-11-03 MED ORDER — POLYVINYL ALCOHOL 1.4 % OP SOLN
1.0000 [drp] | Freq: Two times a day (BID) | OPHTHALMIC | Status: DC
  Administered 2019-11-03 – 2019-11-05 (×4): 1 [drp] via OPHTHALMIC
  Filled 2019-11-03: qty 15

## 2019-11-03 MED ORDER — CARVEDILOL 25 MG PO TABS: 25.0000 mg | ORAL_TABLET | Freq: Two times a day (BID) | ORAL | Status: DC

## 2019-11-03 MED ORDER — NITROGLYCERIN IN D5W 200-5 MCG/ML-% IV SOLN
0.0000 ug/min | INTRAVENOUS | Status: DC
  Administered 2019-11-03 (×2): 5 ug/min via INTRAVENOUS
  Filled 2019-11-03: qty 250

## 2019-11-03 MED ORDER — INSULIN ASPART 100 UNIT/ML ~~LOC~~ SOLN
0.0000 [IU] | Freq: Three times a day (TID) | SUBCUTANEOUS | Status: DC
  Administered 2019-11-04: 2 [IU] via SUBCUTANEOUS
  Filled 2019-11-03: qty 1

## 2019-11-03 MED ORDER — NITROGLYCERIN 0.4 MG SL SUBL: 0.4000 mg | SUBLINGUAL_TABLET | SUBLINGUAL | Status: DC | PRN

## 2019-11-03 MED ORDER — CHLORHEXIDINE GLUCONATE CLOTH 2 % EX PADS
6.0000 | MEDICATED_PAD | Freq: Every day | CUTANEOUS | Status: DC
  Administered 2019-11-04: 6 via TOPICAL
  Filled 2019-11-03: qty 6

## 2019-11-03 MED ORDER — PANTOPRAZOLE SODIUM 40 MG PO TBEC
40.0000 mg | DELAYED_RELEASE_TABLET | Freq: Every day | ORAL | Status: DC
  Administered 2019-11-04 – 2019-11-05 (×2): 40 mg via ORAL
  Filled 2019-11-03 (×2): qty 1

## 2019-11-03 MED ORDER — DIPHENHYDRAMINE HCL 50 MG/ML IJ SOLN
12.5000 mg | Freq: Four times a day (QID) | INTRAMUSCULAR | Status: DC | PRN
  Administered 2019-11-03: 12.5 mg via INTRAVENOUS
  Filled 2019-11-03: qty 1

## 2019-11-03 MED ORDER — IPRATROPIUM BROMIDE 0.06 % NA SOLN
2.0000 | Freq: Two times a day (BID) | NASAL | Status: DC
  Administered 2019-11-03 – 2019-11-05 (×4): 2 via NASAL
  Filled 2019-11-03: qty 15

## 2019-11-03 MED ORDER — HEPARIN BOLUS VIA INFUSION
1200.0000 [IU] | Freq: Once | INTRAVENOUS | Status: AC
  Administered 2019-11-03: 1200 [IU] via INTRAVENOUS
  Filled 2019-11-03: qty 1200

## 2019-11-03 MED ORDER — BRIMONIDINE TARTRATE 0.2 % OP SOLN
1.0000 [drp] | Freq: Three times a day (TID) | OPHTHALMIC | Status: DC
  Administered 2019-11-03 – 2019-11-05 (×5): 1 [drp] via OPHTHALMIC
  Filled 2019-11-03: qty 5

## 2019-11-03 MED ORDER — ONDANSETRON HCL 4 MG/2ML IJ SOLN: 4.0000 mg | Freq: Four times a day (QID) | INTRAMUSCULAR | Status: DC | PRN

## 2019-11-03 MED ORDER — MECLIZINE HCL 25 MG PO TABS
25.0000 mg | ORAL_TABLET | ORAL | Status: DC | PRN
  Filled 2019-11-03: qty 1

## 2019-11-03 MED ORDER — SEVELAMER CARBONATE 800 MG PO TABS
800.0000 mg | ORAL_TABLET | Freq: Three times a day (TID) | ORAL | Status: DC
  Administered 2019-11-04 – 2019-11-05 (×3): 800 mg via ORAL
  Filled 2019-11-03 (×3): qty 1

## 2019-11-03 MED ORDER — INSULIN ASPART 100 UNIT/ML ~~LOC~~ SOLN: 0.0000 [IU] | Freq: Three times a day (TID) | SUBCUTANEOUS | Status: DC

## 2019-11-03 MED ORDER — CLONIDINE HCL 0.1 MG PO TABS
0.2000 mg | ORAL_TABLET | Freq: Once | ORAL | Status: AC
  Administered 2019-11-03: 0.2 mg via ORAL
  Filled 2019-11-03: qty 2

## 2019-11-03 MED ORDER — TRAMADOL HCL 50 MG PO TABS
50.0000 mg | ORAL_TABLET | Freq: Two times a day (BID) | ORAL | Status: DC | PRN
  Administered 2019-11-04 (×2): 50 mg via ORAL
  Filled 2019-11-03 (×2): qty 1

## 2019-11-03 MED ORDER — ATORVASTATIN CALCIUM 80 MG PO TABS
80.0000 mg | ORAL_TABLET | Freq: Every day | ORAL | Status: DC
  Administered 2019-11-03 – 2019-11-04 (×2): 80 mg via ORAL
  Filled 2019-11-03: qty 4
  Filled 2019-11-03: qty 1

## 2019-11-03 MED ORDER — ASPIRIN EC 81 MG PO TBEC
81.0000 mg | DELAYED_RELEASE_TABLET | Freq: Every day | ORAL | Status: DC
  Administered 2019-11-04 – 2019-11-05 (×2): 81 mg via ORAL
  Filled 2019-11-03 (×2): qty 1

## 2019-11-03 NOTE — ED Notes (Signed)
Patient returned from Dialysis.  Patient is alert and oriented x 4 at this time.

## 2019-11-03 NOTE — Progress Notes (Signed)
[facility] for heparin Indication: chest pain/ACS  Allergies  Allergen Reactions  . Chlorthalidone Anaphylaxis, Itching and Rash  . Fentanyl Rash  . Midazolam Rash  . Ace Inhibitors Other (See Comments)    Reaction:  Hyperkalemia, agitation   . Angiotensin Receptor Blockers Other (See Comments)    Reaction:  Hyperkalemia, agitation   . Norvasc [Amlodipine] Itching and Rash  . Phenergan [Promethazine Hcl] Anxiety    "antsy, can't sit still"    Patient Measurements: Height: 4' 4"  (132.1 cm) Weight: 54.4 kg (120 lb) IBW/kg (Calculated) : 27.1 Heparin Dosing Weight: 40 kg  Vital Signs: Temp: 98.2 F (36.8 C) (07/14 1424) Temp Source: Oral (07/14 1424) BP: 209/75 (07/14 1900) Pulse Rate: 57 (07/14 1900)  Labs: Recent Labs    11/03/19 1101 11/03/19 1356  HGB 9.0*  --   HCT 29.4*  --   PLT 157  --   APTT  --  38*  LABPROT  --  18.3*  INR  --  1.6*  CREATININE 7.58*  --   TROPONINIHS 58* 58*    Estimated Creatinine Clearance: 4.3 mL/min (A) (by C-G formula based on SCr of 7.58 mg/dL (H)).   Medical History: Past Medical History:  Diagnosis Date  . (HFpEF) heart failure with preserved ejection fraction ([place])    a. 01/2018 Echo: EF 50-55%, no rwma, Gr1 DD, triv AI, mild MR. Midly dil LA/RV, mod reduced RV fxn. Irreg thickening of TV w/ mobile echodensity that appears to arise from valve.  . Anemia   . Anorexia   . Bacteremia due to Pseudomonas   . Carotid arterial disease ([place])    a. 01/2017 Carotid U/S: RICA <97, LICA <67.  Marland Kitchen CHF (congestive heart failure) ([place])   . Complication of anesthesia    a. Pt reports h/o complication on 9 different occasions - ? hypotension/arrest.  . 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 01/2015 Cath [facility]): 70% LM, p-mLAD 50-60% (Resting FFR 0.75), mRCA 80-90%, ~40 Ost OM & D1-->CABG; b. 01/2016 Staged PCI of LCX x 2 and RCA.  Marland Kitchen ESRD (end stage renal disease) on  dialysis ([place])    a. ESRD secondary to acute kidney failure s/p CABG-->PD  . Essential hypertension   . GERD (gastroesophageal reflux disease)   . Heart murmur   . Hypercholesterolemia   . Myocardial infarction ([place]) 2016  . PVD (peripheral vascular disease) ([place])    a. 01/30/2018 PV Angio: Sev Left Popliteal dzs s/p PTA w/ 20m lutonix DEB w/ mech aspiration of the L popliteal, L AT, and Left tibioperoneal trunck and peroneal artery.  . 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  [facility]: Dr. BMarland KitchenHaithcock: CABG x 3, LIMA to LAD, SVG to RCA, SVG to OM3, EVH  . Sinusitis 2019  . Type II diabetes mellitus with complication (HCC)    CAD     Assessment: 69year old female with SOB. Patient on Eliquis PTA with last dose 7/14 am. Pharmacy consulted for heparin drip for ACS.  Goal of Therapy:  Heparin level 0.3-0.7 units/ml aPTT 66-102 seconds Monitor platelets by anticoagulation protocol: Yes   Plan:  Heparin 1200 unit bolus (reduced) followed by heparin drip at 450 units/hr. Delay in starting heparin drip while patient in dialysis. Baseline HL ordered not drawn. Will follow APTT for now. APTT 7/15 at 0400. CBC and HL daily for now.  ATawnya Crook PharmD 11/03/2019,8:03 PM

## 2019-11-03 NOTE — ED Triage Notes (Signed)
Reports headache, fatigue and SOB upon awakening this AM. Pt due to dialysis today, but was unable to go. Arrives on chronic [place]-2L

## 2019-11-03 NOTE — ED Triage Notes (Signed)
Pt in via EMS from [facility]. Pt is a bilateral BLK amputee. Pt with breathing difficulty this am. Pt oxygen tank at the facility was empty upon EMS arrival. Pt also has dialysis today and facility is requesting that we do that as well.   181/78, 22 RR, HR 60

## 2019-11-03 NOTE — Progress Notes (Signed)
Patient admitted to [facility] from ED. Report received from Judson Roch, [place]. Patient hooked up to monitor and central telemetry notified. Arrived with heparin and nitroglycerin gtt infusing. Patient alert and oriented, complaining of no chest pain. Patient given PRN catapres for blood pressure parameters along with scheduled meds. See MAR and flowsheets for more details, will continue to monitor.

## 2019-11-03 NOTE — ED Notes (Addendum)
MD Lateef states its okay to pause nitro drip during dialysis. Paused by Deneise Lever, RN.

## 2019-11-03 NOTE — ED Notes (Signed)
Pt transported to dialysis

## 2019-11-03 NOTE — ED Provider Notes (Signed)
[facility] Emergency Department Provider Note    First MD Initiated Contact with Patient 11/03/19 1154     (approximate)  I have reviewed the triage vital signs and the nursing notes.   HISTORY  Chief Complaint Headache, Fatigue, and Shortness of Breath    HPI Stephanie Ellis is a 68 y.o. female with extensive past medical history as listed below status post CABG in [place] several years ago presents to the ER for evaluation of headache primarily chest discomfort and pressure.  Denies any fevers.  Is due for dialysis today.  Denies any cough or congestion.  Per EMS they were concerned that potentially her oxygen tank had run out while at the facility.  She does feel better now that she is on oxygen but triage EKG did show evidence of new T wave inversions and she is still complaining of chest discomfort.    Past Medical History:  Diagnosis Date  . (HFpEF) heart failure with preserved ejection fraction ([place])    a. 01/2018 Echo: EF 50-55%, no rwma, Gr1 DD, triv AI, mild MR. Midly dil LA/RV, mod reduced RV fxn. Irreg thickening of TV w/ mobile echodensity that appears to arise from valve.  . Anemia   . Anorexia   . Bacteremia due to Pseudomonas   . Carotid arterial disease ([place])    a. 01/2017 Carotid U/S: RICA <27, LICA <51.  Marland Kitchen CHF (congestive heart failure) ([place])   . Complication of anesthesia    a. Pt reports h/o complication on 9 different occasions - ? hypotension/arrest.  . 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 01/2015 Cath [facility]): 70% LM, p-mLAD 50-60% (Resting FFR 0.75), mRCA 80-90%, ~40 Ost OM & D1-->CABG; b. 01/2016 Staged PCI of LCX x 2 and RCA.  Marland Kitchen ESRD (end stage renal disease) on dialysis ([place])    a. ESRD secondary to acute kidney failure s/p CABG-->PD  . Essential hypertension   . GERD (gastroesophageal reflux disease)   . Heart murmur   . Hypercholesterolemia   . Myocardial infarction ([place]) 2016  . PVD  (peripheral vascular disease) ([place])    a. 01/30/2018 PV Angio: Sev Left Popliteal dzs s/p PTA w/ 18m lutonix DEB w/ mech aspiration of the L popliteal, L AT, and Left tibioperoneal trunck and peroneal artery.  . 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  [facility]: Dr. BMarland KitchenHaithcock: CABG x 3, LIMA to LAD, SVG to RCA, SVG to OM3, EVH  . Sinusitis 2019  . Type II diabetes mellitus with complication (HCC)    CAD   Family History  Problem Relation Age of Onset  . Diabetes Mellitus II Mother   . Heart failure Mother   . Pancreatic cancer Father    Past Surgical History:  Procedure Laterality Date  . ABDOMINAL HYSTERECTOMY    . AMPUTATION Right 02/25/2018   Procedure: AMPUTATION BELOW KNEE;  Surgeon: SKatha Cabal MD;  Location: [facility] ORS;  Service: Vascular;  Laterality: Right;  . AMPUTATION Left 03/11/2018   Procedure: AMPUTATION BELOW KNEE;  Surgeon: DAlgernon Huxley MD;  Location: [facility] ORS;  Service: Vascular;  Laterality: Left;  . AMPUTATION Bilateral 04/13/2018   Procedure: REVISION OF BILATERAL AMPUTATIONS BELOW KNEE WITH WOUND VAC APPLICATIONS;  Surgeon: EEvaristo Bury MD;  Location: [facility] ORS;  Service: Vascular;  Laterality: Bilateral;  . AMPUTATION TOE Left 02/06/2018   Procedure: AMPUTATION GREAT TOE;  Surgeon: FSamara Deist DPM;  Location: [facility] ORS;  Service:  Podiatry;  Laterality: Left;  . APPLICATION OF WOUND VAC Left 04/13/2018   Procedure: APPLICATION OF WOUND VAC;  Surgeon: Evaristo Bury, MD;  Location: [facility] ORS;  Service: Vascular;  Laterality: Left;  . ARTERIOVENOUS GRAFT PLACEMENT  05/2016  . AV FISTULA PLACEMENT Left 05/30/2016   Procedure: ARTERIOVENOUS graft;  Surgeon: Algernon Huxley, MD;  Location: [facility] ORS;  Service: Vascular;  Laterality: Left;  . CAPD INSERTION N/A 10/30/2017   Procedure: LAPAROSCOPIC INSERTION CONTINUOUS AMBULATORY PERITONEAL DIALYSIS  (CAPD) CATHETER;  Surgeon: Algernon Huxley, MD;  Location: [facility] ORS;  Service: Vascular;  Laterality: N/A;  . CARDIAC  CATHETERIZATION  01/2015   [facility]: Ost LM 70%, p-m LAD 50-60% (Rest FFR + @ 0.75), mRCA 80-90%, ostD1 40%, pOM1 40%  . CATARACT EXTRACTION W/PHACO Left 01/18/2016   Procedure: CATARACT EXTRACTION PHACO AND INTRAOCULAR LENS PLACEMENT (IOC);  Surgeon: Eulogio Bear, MD;  Location: [facility] ORS;  Service: Ophthalmology;  Laterality: Left;  [place] 1.05 AP% 15.5 CDE 10.16 Fluid Pack Lot # [id number]  . CATARACT EXTRACTION W/PHACO Right 08/01/2016   Procedure: CATARACT EXTRACTION PHACO AND INTRAOCULAR LENS PLACEMENT (IOC);  Surgeon: Eulogio Bear, MD;  Location: [facility];  Service: Ophthalmology;  Laterality: Right;  [place] 01:00.6 AP% 11.4 CDE 6.93  LOT # [id number]  . COLONOSCOPY    . CORONARY ANGIOPLASTY     SENTS 02/12/16  . CORONARY ARTERY BYPASS GRAFT  03/28/15    [facility]: Dr. Waldemar Dickens: LIMA to LAD, SVG to RCA, SVG to OM3, EVH  . DIALYSIS/PERMA CATHETER INSERTION N/A 02/11/2018   Procedure: DIALYSIS/PERMA CATHETER INSERTION;  Surgeon: Algernon Huxley, MD;  Location: [place] [facility];  Service: Cardiovascular;  Laterality: N/A;  . DIALYSIS/PERMA CATHETER REMOVAL Right 02/04/2018   Procedure: DIALYSIS/PERMA CATHETER REMOVAL;  Surgeon: Katha Cabal, MD;  Location: [place] [facility];  Service: Cardiovascular;  Laterality: Right;  . ESOPHAGOGASTRODUODENOSCOPY (EGD) WITH PROPOFOL N/A 10/24/2016   Procedure: ESOPHAGOGASTRODUODENOSCOPY (EGD) WITH PROPOFOL;  Surgeon: [name], MD;  Location: [facility];  Service: Endoscopy;  Laterality: N/A;  . EYE SURGERY Bilateral    cataract surgery  . EYE SURGERY     drains for glaucoma  . INSERTION EXPRESS TUBE SHUNT Right 08/01/2016   Procedure: INSERTION EXPRESS TUBE SHUNT;  Surgeon: Eulogio Bear, MD;  Location: [facility];  Service: Ophthalmology;  Laterality: Right;  . INSERTION OF AHMED VALVE Left 08/15/2016   Procedure: INSERTION OF AHMED VALVE;  Surgeon: Eulogio Bear, MD;  Location: [facility];  Service: Ophthalmology;  Laterality: Left;  .  INSERTION OF DIALYSIS CATHETER    . LOWER EXTREMITY ANGIOGRAPHY Right 02/19/2018   Procedure: Lower Extremity Angiography;  Surgeon: Algernon Huxley, MD;  Location: [place] CV LAB;  Service: Cardiovascular;  Laterality: Right;  . LOWER EXTREMITY ANGIOGRAPHY Left 01/30/2018   Procedure: Lower Extremity Angiography;  Surgeon: Katha Cabal, MD;  Location: [place] [facility];  Service: Cardiovascular;  Laterality: Left;  . REMOVAL OF A DIALYSIS CATHETER N/A 02/25/2018   Procedure: REMOVAL OF A DIALYSIS CATHETER;  Surgeon: Katha Cabal, MD;  Location: [facility];  Service: Vascular;  Laterality: N/A;  . TEMPORARY DIALYSIS CATHETER N/A 02/06/2018   Procedure: TEMPORARY DIALYSIS CATHETER;  Surgeon: Katha Cabal, MD;  Location: [place] [facility];  Service: Cardiovascular;  Laterality: N/A;  . TRANSTHORACIC ECHOCARDIOGRAM  January 2017    EF 60-65%. GR 2 DD. Mild degenerative mitral valve disease but no prolapse or regurgitation. Mild left atrial dilation.  Mild to moderate LVH. Pericardial effusion gone  . UPPER EXTREMITY ANGIOGRAPHY Left 06/27/2016   Procedure: Upper Extremity Angiography;  Surgeon: Algernon Huxley, MD;  Location: [facility];  Service: Cardiovascular;  Laterality: Left;  . UPPER EXTREMITY ANGIOGRAPHY Left 08/22/2016   Procedure: Upper Extremity Angiography;  Surgeon: Algernon Huxley, MD;  Location: [place] [facility];  Service: Cardiovascular;  Laterality: Left;  Marland Kitchen VASCULAR SURGERY    . WOUND DEBRIDEMENT Left 05/08/2018   Procedure: DEBRIDEMENT OF LEFT LEG BKA;  Surgeon: Algernon Huxley, MD;  Location: [facility] ORS;  Service: General;  Laterality: Left;   Patient Active Problem List   Diagnosis Date Noted  . Nocturnal hypoxia   . Shortness of breath   . Abdominal swelling   . Acute on chronic systolic CHF (congestive heart failure) ([place])   . Acute respiratory failure with hypoxia ([place]) 09/02/2019  . Acute on chronic combined systolic (congestive) and diastolic  (congestive) heart failure ([place]) 09/02/2019  . 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 Severe anemia 05/01/2019  . Pneumonia due to COVID-19 virus 05/01/2019  . Resides in skilled nursing facility 12/29/2018  . CVA (cerebral vascular accident) ([place]) 07/08/2018  . Infection of amputation stump ([place]) 05/02/2018  . BKA stump complication ([place]) 45/80/9983  . Unilateral complete BKA, right, initial encounter ([place]) 03/30/2018  . Transient loss of consciousness 03/19/2018  . Transient alteration of awareness   . VT (ventricular tachycardia) ([place])   . Disorientation   . Hypertensive urgency 03/08/2018  . Status post below-knee amputation of both lower extremities ([place]) 03/02/2018  . Ischemia of extremity 02/18/2018  . CKD (chronic kidney disease) stage 5, GFR less than 15 ml/min (HCC) 02/16/2018  . Diabetic osteomyelitis ([place])   . Peripheral vascular disease ([place])   . Advanced care planning/counseling discussion   . Palliative care by specialist   . Goals of care, counseling/discussion   . Type 2 diabetes mellitus with hyperlipidemia ([place]) 01/30/2018  . Sepsis ([place]) 01/29/2018  . Peripheral neuropathy 06/17/2017  . Encephalopathy 02/06/2017  . Accelerated hypertension 02/06/2017  . Endophthalmitis, left eye 12/05/2016  . Vomiting 10/24/2016  . Hematuria 10/17/2016  . Acute lower UTI 10/17/2016  . Anesthesia complication 38/25/0539  . Chest pain 09/04/2016  . Steal syndrome dialysis vascular access, initial encounter ([place]) 06/18/2016  . 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 Coronary artery disease involving coronary bypass graft of native heart with angina pectoris ([place]) 02/10/2016  . Hypertension 02/02/2016  . Hyperlipidemia 02/02/2016  . Coronary artery disease involving left main coronary artery 10/04/2015  . Abdominal pain of unknown etiology 10/04/2015  . ESRD (end stage renal disease) on dialysis ([place])   . Type II diabetes mellitus with complication ([place])   .  NSTEMI (non-ST elevated myocardial infarction) ([place])   . Right sided abdominal pain   . Elevated troponin 10/03/2015  . 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 Acute on chronic combined systolic and diastolic CHF (congestive heart failure) ([place]) 04/19/2014  . Anemia 09/20/2013  . Routine health maintenance 01/29/2013      Prior to Admission medications   Medication Sig Start Date End Date Taking? Authorizing Provider  apixaban (ELIQUIS) 2.5 MG TABS tablet Take 1 tablet (2.5 mg total) by mouth 2 (two) times daily. 02/15/18   Vaughan Basta, MD  aspirin EC 81 MG tablet Take 1 tablet (81 mg total) by mouth daily. 10/19/16   Dustin Flock, MD  atorvastatin (LIPITOR) 80 MG tablet Take 80 mg by mouth at bedtime.  05/05/17   [provider]  brimonidine (ALPHAGAN) 0.2 % ophthalmic solution Place 1 drop into both eyes 3 (three) times daily. 03/18/18   Bettey Costa, MD  carvedilol (COREG) 25 MG tablet Take 1 tablet (25 mg total) by mouth 2 (two) times daily. 07/13/18   Stark Jock Jude, MD  cloNIDine (CATAPRES) 0.1 MG tablet Take 0.1 mg by mouth daily as needed (BP >160/80).    [provider]  fluticasone (FLONASE) 50 MCG/ACT nasal spray Place 2 sprays into both nostrils daily.    [provider]  insulin aspart (NOVOLOG) 100 UNIT/ML injection Inject 0-6 Units into the skin See admin instructions. Inject under the skin according to blood glucose reading:  <201: 0u 201- 250: 2u 251-300: 4u 301-350: 5u 351-400: 6u    [provider]  insulin glargine (LANTUS) 100 UNIT/ML injection Inject 5 Units into the skin daily in the afternoon.     [provider]  ipratropium (ATROVENT) 0.06 % nasal spray Place 2 sprays into both nostrils 2 (two) times daily. 09/05/19   Loletha Grayer, MD  isosorbide mononitrate (IMDUR) 30 MG 24 hr tablet Take 30 mg by mouth daily.     [provider]  levETIRAcetam (KEPPRA) 250 MG tablet Three times weekly after dialysis Patient  taking differently: Take 250 mg by mouth Every Tuesday,Thursday,and Saturday with dialysis.  05/12/18   Loletha Grayer, MD  levETIRAcetam (KEPPRA) 500 MG tablet Take 1 tablet (500 mg total) by mouth 2 (two) times daily. 03/23/18   [name], MD  meclizine (ANTIVERT) 25 MG tablet Take 25 mg by mouth every 4 (four) hours as needed for dizziness.    [provider]  mirtazapine (REMERON) 15 MG tablet Take 7.5 mg by mouth daily.     [provider]  Omega-3 1000 MG CAPS Take 2 capsules by mouth daily.     [provider]  pantoprazole (PROTONIX) 40 MG tablet Take 1 tablet (40 mg total) by mouth daily. 06/04/19   [name], MD  Polyvinyl Alcohol-Povidone PF (REFRESH) 1.4-0.6 % SOLN Place 1 drop into both eyes 2 (two) times a day.     [provider]  rOPINIRole (REQUIP) 0.25 MG tablet Take 1 tablet (0.25 mg total) by mouth at bedtime. Patient taking differently: Take 0.25 mg by mouth daily.  05/12/18   Loletha Grayer, MD  sevelamer carbonate (RENVELA) 800 MG tablet Take 800 mg by mouth 3 (three) times daily with meals.     [provider]  timolol (TIMOPTIC) 0.5 % ophthalmic solution Place 1 drop into both eyes 3 (three) times daily. 03/18/18   Bettey Costa, MD  traMADol (ULTRAM) 50 MG tablet Take 1 tablet (50 mg total) by mouth every 12 (twelve) hours as needed for severe pain. 09/05/19   Loletha Grayer, MD  vitamin B-12 (CYANOCOBALAMIN) 1000 MCG tablet Take 1 tablet (1,000 mcg total) by mouth daily. 02/15/18   Vaughan Basta, MD    Allergies Chlorthalidone, Fentanyl, Midazolam, Ace inhibitors, Angiotensin receptor blockers, Norvasc [amlodipine], and Phenergan [promethazine hcl]    Social History Social History   Tobacco Use  . Smoking status: Never Smoker  . Smokeless tobacco: Never Used  Vaping Use  . Vaping Use: Never used  Substance Use Topics  . Alcohol use: No  . Drug use: No    Review of Systems Patient denies headaches,  rhinorrhea, blurry vision, numbness, shortness of breath, chest pain, edema, cough, abdominal pain, nausea, vomiting, diarrhea, dysuria, fevers, rashes or hallucinations unless otherwise  stated above in HPI. ____________________________________________   PHYSICAL EXAM:  VITAL SIGNS: Vitals:   11/03/19 1200 11/03/19 1305  BP: (!) 203/83 (!) 200/81  Pulse: 63 67  Resp: (!) 9 18  Temp:    SpO2: 99% 100%    Constitutional: Alert and oriented.  Eyes: Conjunctivae are normal.  Head: Atraumatic. Nose: No congestion/rhinnorhea. Mouth/Throat: Mucous membranes are moist.   Neck: No stridor. Painless ROM.  Cardiovascular: Normal rate, regular rhythm. Grossly normal heart sounds.  Good peripheral circulation. Respiratory: Normal respiratory effort.  No retractions. Lungs bibasilar crackles Gastrointestinal: Soft and nontender. No distention. No abdominal bruits. No CVA tenderness. Genitourinary:  Musculoskeletal: No lower extremity tenderness nor edema.  No joint effusions.  [place] AKa Neurologic:  Normal speech and language. No gross focal neurologic deficits are appreciated. No facial droop Skin:  Skin is warm, dry and intact. No rash noted. Psychiatric: Mood and affect are normal. Speech and behavior are normal.  ____________________________________________   LABS (all labs ordered are listed, but only abnormal results are displayed)  Results for orders placed or performed during the hospital encounter of 11/03/19 (from the past 24 hour(s))  Basic metabolic panel     Status: Abnormal   Collection Time: 11/03/19 11:01 AM  Result Value Ref Range   Sodium 135 135 - 145 mmol/L   Potassium 4.6 3.5 - 5.1 mmol/L   Chloride 92 (L) 98 - 111 mmol/L   CO2 27 22 - 32 mmol/L   Glucose, Bld 144 (H) 70 - 99 mg/dL   BUN 45 (H) 8 - 23 mg/dL   Creatinine, Ser 7.58 (H) 0.44 - 1.00 mg/dL   Calcium 8.7 (L) 8.9 - 10.3 mg/dL   GFR calc non Af Amer 5 (L) >60 mL/min   GFR calc Af Amer 6 (L) >60 mL/min     Anion gap 16 (H) 5 - 15  CBC     Status: Abnormal   Collection Time: 11/03/19 11:01 AM  Result Value Ref Range   WBC 5.5 4.0 - 10.5 K/uL   RBC 2.89 (L) 3.87 - 5.11 MIL/uL   Hemoglobin 9.0 (L) 12.0 - 15.0 g/dL   HCT 29.4 (L) 36 - 46 %   MCV 101.7 (H) 80.0 - 100.0 fL   MCH 31.1 26.0 - 34.0 pg   MCHC 30.6 30.0 - 36.0 g/dL   RDW 18.3 (H) 11.5 - 15.5 %   Platelets 157 150 - 400 K/uL   nRBC 0.0 0.0 - 0.2 %  Troponin I (High Sensitivity)     Status: Abnormal   Collection Time: 11/03/19 11:01 AM  Result Value Ref Range   Troponin I (High Sensitivity) 58 (H) <18 ng/L   ____________________________________________  EKG My review and personal interpretation at Time: 10:59   Indication: chest pain  Rate: 65  Rhythm: sinus Axis: normal Other: normal intervals, inferolateral twi ____________________________________________  RADIOLOGY  I personally reviewed all radiographic images ordered to evaluate for the above acute complaints and reviewed radiology reports and findings.  These findings were personally discussed with the patient.  Please see medical record for radiology report.  ____________________________________________   PROCEDURES  Procedure(s) performed:  .Critical Care Performed by: Merlyn Lot, MD Authorized by: Merlyn Lot, MD   Critical care provider statement:    Critical care time (minutes):  35   Critical care time was exclusive of:  Separately billable procedures and treating other patients   Critical care was time spent personally by me on the following activities:  Development of  treatment plan with patient or surrogate, discussions with consultants, evaluation of patient's response to treatment, examination of patient, obtaining history from patient or surrogate, ordering and performing treatments and interventions, ordering and review of laboratory studies, ordering and review of radiographic studies, pulse oximetry, re-evaluation of patient's  condition and review of old charts      Critical Care performed: yes ____________________________________________   INITIAL IMPRESSION / ASSESSMENT AND PLAN / ED COURSE  Pertinent labs & imaging results that were available during my care of the patient were reviewed by me and considered in my medical decision making (see chart for details).   DDX: ACS, pericarditis, esophagitis, boerhaaves, pe, dissection, pna, bronchitis, costochondritis   Stephanie Ellis is a 28 y.o. who presents to the ED with symptoms and chest discomfort as described above.  Patient with extensive and complex past medical history very high risk for ACS with evidence of acute T wave inversions on EKG.  Her troponin is stable she is markedly hypertensive and due for dialysis.  May simply be demand ischemia or strain from CHF.  Discussed case in consultation with cardiology does recommend heparin.  Heparin was cleared by nephrology and patient will be able to get dialysis.  Discussed case with hospitalist for admission.     The patient was evaluated in Emergency Department today for the symptoms described in the history of present illness. He/she was evaluated in the context of the global COVID-19 pandemic, which necessitated consideration that the patient might be at risk for infection with the SARS-CoV-2 virus that causes COVID-19. Institutional protocols and algorithms that pertain to the evaluation of patients at risk for COVID-19 are in a state of rapid change based on information released by regulatory bodies including the CDC and federal and state organizations. These policies and algorithms were followed during the patient's care in the ED.  As part of my medical decision making, I reviewed the following data within the [place] notes reviewed and incorporated, Labs reviewed, notes from prior ED visits and [place] Controlled Substance  Database   ____________________________________________   FINAL CLINICAL IMPRESSION(S) / ED DIAGNOSES  Final diagnoses:  Chest pain, unspecified type  Hypertensive urgency      NEW MEDICATIONS STARTED DURING THIS VISIT:  New Prescriptions   No medications on file     Note:  This document was prepared using Dragon voice recognition software and may include unintentional dictation errors.    Merlyn Lot, MD 11/03/19 [phone]

## 2019-11-03 NOTE — H&P (Signed)
History and Physical    [name] DTO:[id number] DOB: Aug 07, 1950 DOA: 11/03/2019  PCP: Stephanie Ruths, MD  Patient coming from: SNF  I have personally briefly reviewed patient's old medical records in [place]  Chief Complaint: chest pain  HPI: Stephanie Ellis is a 69 y.o. female with medical history significant of ESRD on dialysis MWF, CAD status post three-vessel CABG, chronic hypoxic respiratory failure on 2 L/min supplemental oxygen, type 2 diabetes, combined systolic/diastolic CHF, peripheral vascular disease, hypertension, hyperlipidemia presented to the ED today with complaints of headache, fatigue and shortness of breath in the setting of chest pressure.  Patient reports waking up this morning and had a nosebleed.  She then developed a headache and later chest pressure/heaviness.  States she does not recall having chest pain with her prior MI so she is unsure if this is similar.  She denies recent illnesses including fevers or chills, abdominal pain, nausea vomiting or diarrhea, congestion, dysuria or urinary frequency, sore throat or sick contacts.  Patient was to have dialysis today but was unable to go due to above symptoms.  She currently states that her chest now feels heavy but denies diaphoresis, shortness of breath, nausea, palpitations or other associated complaints.      ED Course: Afebrile, initial BP 179/75, later BP was over 983 systolic.  Oxygen saturation 97% on chronic 2 L/min oxygen.  Labs notable for stable chronic anemia, end-stage renal disease and troponin mildly elevated at 58.  Chest x-ray showed mild CHF with no changes from prior study.  EKG was concerning for inverted T waves in the inferolateral leads.  Cardiology was consulted and recommended heparin infusion and admission for further evaluation of chest pain.  Review of Systems: As per HPI otherwise 10 point review of systems negative.    Past Medical History:  Diagnosis Date  . (HFpEF) heart  failure with preserved ejection fraction ([place])    a. 01/2018 Echo: EF 50-55%, no rwma, Gr1 DD, triv AI, mild MR. Midly dil LA/RV, mod reduced RV fxn. Irreg thickening of TV w/ mobile echodensity that appears to arise from valve.  . Anemia   . Anorexia   . Bacteremia due to Pseudomonas   . Carotid arterial disease ([place])    a. 01/2017 Carotid U/S: RICA <38, LICA <25.  Marland Kitchen CHF (congestive heart failure) ([place])   . Complication of anesthesia    a. Pt reports h/o complication on 9 different occasions - ? hypotension/arrest.  . 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 01/2015 Cath [facility]): 70% LM, p-mLAD 50-60% (Resting FFR 0.75), mRCA 80-90%, ~40 Ost OM & D1-->CABG; b. 01/2016 Staged PCI of LCX x 2 and RCA.  Marland Kitchen ESRD (end stage renal disease) on dialysis ([place])    a. ESRD secondary to acute kidney failure s/p CABG-->PD  . Essential hypertension   . GERD (gastroesophageal reflux disease)   . Heart murmur   . Hypercholesterolemia   . Myocardial infarction ([place]) 2016  . PVD (peripheral vascular disease) ([place])    a. 01/30/2018 PV Angio: Sev Left Popliteal dzs s/p PTA w/ 43m lutonix DEB w/ mech aspiration of the L popliteal, L AT, and Left tibioperoneal trunck and peroneal artery.  . 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  [facility]: Dr. BMarland KitchenHaithcock: CABG x 3, LIMA to LAD, SVG to RCA, SVG to OM3, EVH  . Sinusitis 2019  . Type II diabetes mellitus with complication (HCC)    CAD  Past Surgical History:  Procedure Laterality Date  . ABDOMINAL HYSTERECTOMY    . AMPUTATION Right 02/25/2018   Procedure: AMPUTATION BELOW KNEE;  Surgeon: Katha Cabal, MD;  Location: [facility] ORS;  Service: Vascular;  Laterality: Right;  . AMPUTATION Left 03/11/2018   Procedure: AMPUTATION BELOW KNEE;  Surgeon: Algernon Huxley, MD;  Location: [facility] ORS;  Service: Vascular;  Laterality: Left;  . AMPUTATION Bilateral 04/13/2018   Procedure: REVISION OF BILATERAL AMPUTATIONS BELOW KNEE WITH WOUND VAC  APPLICATIONS;  Surgeon: Evaristo Bury, MD;  Location: [facility];  Service: Vascular;  Laterality: Bilateral;  . AMPUTATION TOE Left 02/06/2018   Procedure: AMPUTATION GREAT TOE;  Surgeon: Samara Deist, DPM;  Location: [facility] OR[facility];  Service: Podiatry;  Laterality: Left;  . APPLICATION OF WOUND VAC Left 04/13/2018   Procedure: APPLICATION OF WOUND VAC;  Surgeon: Evaristo Bury, MD;  Location: [facility];  Service: Vascular;  Laterality: Left;  . ARTERIOVENOUS GRAFT PLACEMENT  05/2016  . AV FISTULA PLACEMENT Left 05/30/2016   Procedure: ARTERIOVENOUS graft;  Surgeon: Algernon Huxley, MD;  Location: [facility];  Service: Vascular;  Laterality: Left;  . CAPD INSERTION N/A 10/30/2017   Procedure: LAPAROSCOPIC INSERTION CONTINUOUS AMBULATORY PERITONEAL DIALYSIS  (CAPD) CATHETER;  Surgeon: Algernon Huxley, MD;  Location: [facility];  Service: Vascular;  Laterality: N/A;  . CARDIAC CATHETERIZATION  01/2015   [facility]: Ost LM 70%, p-m LAD 50-60% (Rest FFR + @ 0.75), mRCA 80-90%, ostD1 40%, pOM1 40%  . CATARACT EXTRACTION W/PHACO Left 01/18/2016   Procedure: CATARACT EXTRACTION PHACO AND INTRAOCULAR LENS PLACEMENT (IOC);  Surgeon: Eulogio Bear, MD;  Location: [facility];  Service: Ophthalmology;  Laterality: Left;  [place] 1.05 AP% 15.5 CDE 10.16 Fluid Pack Lot # [id number]  . CATARACT EXTRACTION W/PHACO Right 08/01/2016   Procedure: CATARACT EXTRACTION PHACO AND INTRAOCULAR LENS PLACEMENT (IOC);  Surgeon: Eulogio Bear, MD;  Location: [facility];  Service: Ophthalmology;  Laterality: Right;  [place] 01:00.6 AP% 11.4 CDE 6.93  LOT # [id number]  . COLONOSCOPY    . CORONARY ANGIOPLASTY     SENTS 02/12/16  . CORONARY ARTERY BYPASS GRAFT  03/28/15    [facility]: Dr. Waldemar Dickens: LIMA to LAD, SVG to RCA, SVG to OM3, EVH  . DIALYSIS/PERMA CATHETER INSERTION N/A 02/11/2018   Procedure: DIALYSIS/PERMA CATHETER INSERTION;  Surgeon: Algernon Huxley, MD;  Location: [place] CV LAB;  Service: Cardiovascular;  Laterality: N/A;  . DIALYSIS/PERMA  CATHETER REMOVAL Right 02/04/2018   Procedure: DIALYSIS/PERMA CATHETER REMOVAL;  Surgeon: Katha Cabal, MD;  Location: [place] CV LAB;  Service: Cardiovascular;  Laterality: Right;  . ESOPHAGOGASTRODUODENOSCOPY (EGD) WITH PROPOFOL N/A 10/24/2016   Procedure: ESOPHAGOGASTRODUODENOSCOPY (EGD) WITH PROPOFOL;  Surgeon: [name], MD;  Location: [facility];  Service: Endoscopy;  Laterality: N/A;  . EYE SURGERY Bilateral    cataract surgery  . EYE SURGERY     drains for glaucoma  . INSERTION EXPRESS TUBE SHUNT Right 08/01/2016   Procedure: INSERTION EXPRESS TUBE SHUNT;  Surgeon: Eulogio Bear, MD;  Location: [facility];  Service: Ophthalmology;  Laterality: Right;  . INSERTION OF AHMED VALVE Left 08/15/2016   Procedure: INSERTION OF AHMED VALVE;  Surgeon: Eulogio Bear, MD;  Location: [facility];  Service: Ophthalmology;  Laterality: Left;  . INSERTION OF DIALYSIS CATHETER    . LOWER EXTREMITY ANGIOGRAPHY Right 02/19/2018   Procedure: Lower Extremity Angiography;  Surgeon: Algernon Huxley, MD;  Location: [place] CV LAB;  Service: Cardiovascular;  Laterality: Right;  .  LOWER EXTREMITY ANGIOGRAPHY Left 01/30/2018   Procedure: Lower Extremity Angiography;  Surgeon: Katha Cabal, MD;  Location: [place] CV LAB;  Service: Cardiovascular;  Laterality: Left;  . REMOVAL OF A DIALYSIS CATHETER N/A 02/25/2018   Procedure: REMOVAL OF A DIALYSIS CATHETER;  Surgeon: Katha Cabal, MD;  Location: [facility];  Service: Vascular;  Laterality: N/A;  . TEMPORARY DIALYSIS CATHETER N/A 02/06/2018   Procedure: TEMPORARY DIALYSIS CATHETER;  Surgeon: Katha Cabal, MD;  Location: [place] CV LAB;  Service: Cardiovascular;  Laterality: N/A;  . TRANSTHORACIC ECHOCARDIOGRAM  January 2017    EF 60-65%. GR 2 DD. Mild degenerative mitral valve disease but no prolapse or regurgitation. Mild left atrial dilation. Mild to moderate LVH. Pericardial effusion gone  . UPPER EXTREMITY  ANGIOGRAPHY Left 06/27/2016   Procedure: Upper Extremity Angiography;  Surgeon: Algernon Huxley, MD;  Location: [place] CV LAB;  Service: Cardiovascular;  Laterality: Left;  . UPPER EXTREMITY ANGIOGRAPHY Left 08/22/2016   Procedure: Upper Extremity Angiography;  Surgeon: Algernon Huxley, MD;  Location: [place] CV LAB;  Service: Cardiovascular;  Laterality: Left;  Marland Kitchen VASCULAR SURGERY    . WOUND DEBRIDEMENT Left 05/08/2018   Procedure: DEBRIDEMENT OF LEFT LEG BKA;  Surgeon: Algernon Huxley, MD;  Location: [facility];  Service: General;  Laterality: Left;     reports that she has never smoked. She has never used smokeless tobacco. She reports that she does not drink alcohol and does not use drugs.  Allergies  Allergen Reactions  . Chlorthalidone Anaphylaxis, Itching and Rash  . Fentanyl Rash  . Midazolam Rash  . Ace Inhibitors Other (See Comments)    Reaction:  Hyperkalemia, agitation   . Angiotensin Receptor Blockers Other (See Comments)    Reaction:  Hyperkalemia, agitation   . Norvasc [Amlodipine] Itching and Rash  . Phenergan [Promethazine Hcl] Anxiety    "antsy, can't sit still"    Family History  Problem Relation Age of Onset  . Diabetes Mellitus II Mother   . Heart failure Mother   . Pancreatic cancer Father       Prior to Admission medications   Medication Sig Start Date End Date Taking? Authorizing Provider  apixaban (ELIQUIS) 2.5 MG TABS tablet Take 1 tablet (2.5 mg total) by mouth 2 (two) times daily. 02/15/18   Vaughan Basta, MD  aspirin EC 81 MG tablet Take 1 tablet (81 mg total) by mouth daily. 10/19/16   Dustin Flock, MD  atorvastatin (LIPITOR) 80 MG tablet Take 80 mg by mouth at bedtime.  05/05/17   [provider]  brimonidine (ALPHAGAN) 0.2 % ophthalmic solution Place 1 drop into both eyes 3 (three) times daily. 03/18/18   Bettey Costa, MD  carvedilol (COREG) 25 MG tablet Take 1 tablet (25 mg total) by mouth 2 (two) times daily. 07/13/18   Stark Jock Jude,  MD  cloNIDine (CATAPRES) 0.1 MG tablet Take 0.1 mg by mouth daily as needed (BP >160/80).    [provider]  fluticasone (FLONASE) 50 MCG/ACT nasal spray Place 2 sprays into both nostrils daily.    [provider]  insulin aspart (NOVOLOG) 100 UNIT/ML injection Inject 0-6 Units into the skin See admin instructions. Inject under the skin according to blood glucose reading:  <201: 0u 201- 250: 2u 251-300: 4u 301-350: 5u 351-400: 6u    [provider]  insulin glargine (LANTUS) 100 UNIT/ML injection Inject 5 Units into the skin daily in the afternoon.     [provider]  ipratropium (ATROVENT) 0.06 % nasal spray Place 2 sprays into both nostrils 2 (two) times daily. 09/05/19   Loletha Grayer, MD  isosorbide mononitrate (IMDUR) 30 MG 24 hr tablet Take 30 mg by mouth daily.     [provider]  levETIRAcetam (KEPPRA) 250 MG tablet Three times weekly after dialysis Patient taking differently: Take 250 mg by mouth Every Tuesday,Thursday,and Saturday with dialysis.  05/12/18   Loletha Grayer, MD  levETIRAcetam (KEPPRA) 500 MG tablet Take 1 tablet (500 mg total) by mouth 2 (two) times daily. 03/23/18   [name], MD  meclizine (ANTIVERT) 25 MG tablet Take 25 mg by mouth every 4 (four) hours as needed for dizziness.    [provider]  mirtazapine (REMERON) 15 MG tablet Take 7.5 mg by mouth daily.     [provider]  Omega-3 1000 MG CAPS Take 2 capsules by mouth daily.     [provider]  pantoprazole (PROTONIX) 40 MG tablet Take 1 tablet (40 mg total) by mouth daily. 06/04/19   [name], MD  Polyvinyl Alcohol-Povidone PF (REFRESH) 1.4-0.6 % SOLN Place 1 drop into both eyes 2 (two) times a day.     [provider]  rOPINIRole (REQUIP) 0.25 MG tablet Take 1 tablet (0.25 mg total) by mouth at bedtime. Patient taking differently: Take 0.25 mg by mouth daily.  05/12/18   Loletha Grayer, MD  sevelamer carbonate  (RENVELA) 800 MG tablet Take 800 mg by mouth 3 (three) times daily with meals.     [provider]  timolol (TIMOPTIC) 0.5 % ophthalmic solution Place 1 drop into both eyes 3 (three) times daily. 03/18/18   Bettey Costa, MD  traMADol (ULTRAM) 50 MG tablet Take 1 tablet (50 mg total) by mouth every 12 (twelve) hours as needed for severe pain. 09/05/19   Loletha Grayer, MD  vitamin B-12 (CYANOCOBALAMIN) 1000 MCG tablet Take 1 tablet (1,000 mcg total) by mouth daily. 02/15/18   Vaughan Basta, MD    Physical Exam: Vitals:   11/03/19 1200 11/03/19 1305 11/03/19 1330 11/03/19 1400  BP: (!) 203/83 (!) 200/81 (!) 210/88 (!) 190/125  Pulse: 63 67 63 65  Resp: (!) 9 18 13    Temp:      TempSrc:      SpO2: 99% 100% 100% 100%  Weight:      Height:        Constitutional: NAD, calm, comfortable Eyes: EOMI, lids and conjunctivae normal ENMT: Mucous membranes are moist.  Hearing grossly normal. Respiratory: Decreased breath sounds but overall clear, no wheezing, no crackles. Normal respiratory effort. No accessory muscle use.  On 2 L/min nasal cannula oxygen. Cardiovascular: RRR, no murmurs / rubs / gallops. No extremity edema, anterior left upper chest with permacath in place.  Abdomen: soft, NT, ND, no masses or HSM palpated. +Bowel sounds.  Musculoskeletal: Bilateral BKA 's, no clubbing / cyanosis. Normal muscle tone.  Skin: dry, intact, normal color, normal temperature Neurologic: CN 2-12 grossly intact. Normal speech.  Grossly non-focal exam. Psychiatric: Alert and oriented x 3. Normal mood. Congruent affect.  Normal judgement and insight.   Labs on Admission: I have personally reviewed following labs and imaging studies  CBC: Recent Labs  Lab 11/03/19 1101  WBC 5.5  HGB 9.0*  HCT 29.4*  MCV 101.7*  PLT 462   Basic Metabolic Panel: Recent Labs  Lab 11/03/19 1101  NA 135  K 4.6  CL 92*  CO2 27  GLUCOSE 144*  BUN  45*  CREATININE 7.58*  CALCIUM 8.7*    GFR: Estimated Creatinine Clearance: 4.3 mL/min (A) (by C-G formula based on SCr of 7.58 mg/dL (H)). Liver Function Tests: No results for input(s): AST, ALT, ALKPHOS, BILITOT, PROT, ALBUMIN in the last 168 hours. No results for input(s): LIPASE, AMYLASE in the last 168 hours. No results for input(s): AMMONIA in the last 168 hours. Coagulation Profile: No results for input(s): INR, PROTIME in the last 168 hours. Cardiac Enzymes: No results for input(s): CKTOTAL, CKMB, CKMBINDEX, TROPONINI in the last 168 hours. BNP (last 3 results) No results for input(s): PROBNP in the last 8760 hours. HbA1C: No results for input(s): HGBA1C in the last 72 hours. CBG: No results for input(s): GLUCAP in the last 168 hours. Lipid Profile: No results for input(s): CHOL, HDL, LDLCALC, TRIG, CHOLHDL, LDLDIRECT in the last 72 hours. Thyroid Function Tests: No results for input(s): TSH, T4TOTAL, FREET4, T3FREE, THYROIDAB in the last 72 hours. Anemia Panel: No results for input(s): VITAMINB12, FOLATE, FERRITIN, TIBC, IRON, RETICCTPCT in the last 72 hours. Urine analysis:    Component Value Date/Time   COLORURINE RED (A) 10/17/2016 1643   APPEARANCEUR TURBID (A) 10/17/2016 1643   LABSPEC 1.014 10/17/2016 1643   PHURINE  10/17/2016 1643    TEST NOT REPORTED DUE TO COLOR INTERFERENCE OF URINE PIGMENT   GLUCOSEU (A) 10/17/2016 1643    TEST NOT REPORTED DUE TO COLOR INTERFERENCE OF URINE PIGMENT   HGBUR (A) 10/17/2016 1643    TEST NOT REPORTED DUE TO COLOR INTERFERENCE OF URINE PIGMENT   BILIRUBINUR (A) 10/17/2016 1643    TEST NOT REPORTED DUE TO COLOR INTERFERENCE OF URINE PIGMENT   KETONESUR (A) 10/17/2016 1643    TEST NOT REPORTED DUE TO COLOR INTERFERENCE OF URINE PIGMENT   PROTEINUR (A) 10/17/2016 1643    TEST NOT REPORTED DUE TO COLOR INTERFERENCE OF URINE PIGMENT   NITRITE (A) 10/17/2016 1643    TEST NOT REPORTED DUE TO COLOR INTERFERENCE OF URINE PIGMENT   LEUKOCYTESUR (A) 10/17/2016 1643     TEST NOT REPORTED DUE TO COLOR INTERFERENCE OF URINE PIGMENT    Radiological Exams on Admission: DG Chest 2 View  Result Date: 11/03/2019 CLINICAL DATA:  Shortness of breath. EXAM: CHEST - 2 VIEW COMPARISON:  CT chest and chest x-ray dated Sep 02, 2019. FINDINGS: Unchanged tunneled left internal jugular dialysis catheter with tip in the right atrium. Stable cardiomegaly status post CABG. Similar appearing mild diffuse interstitial thickening and small bilateral pleural effusions with bibasilar atelectasis. No pneumothorax. No acute osseous abnormality. IMPRESSION: 1. Unchanged mild congestive heart failure. Electronically Signed   By: Titus Dubin M.D.   On: 11/03/2019 12:32    EKG: Independently reviewed.  Normal sinus rhythm, 66 bpm with T wave inversions in leads II, III, aVF and V3 through V6.  No ST elevation.  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  ED physician discussed case with cardiology who recommended admission, heparin infusion, and will consult.  Stat EKG if recurrent chest pain.  As needed sublingual nitro for chest pain.  NSTEMI vs Elevated troponin -present on admission, initial troponin 58, T wave inversions on ECG.  Continue to trend troponin until decreasing cardiology is following.  ESRD on dialysis MWF  -nephrology is consulted for dialysis.   Patient did not attend her session today.  Renal function and electrolytes.     Hypertensive urgency -on admission, systolic BPs over 532 in the ED, and remained elevated despite being given her home blood pressure medications.  Started on nitro drip.    CAD s/p 3 vessel CABG - presented with chest pain.  Evaluation as above.  Continue aspirin and Lipitor.  Hypertension -uncontrolled on admission as above.  Continue home meds Coreg, clonidine  Type 2 diabetes -continue home Lantus 5 units, sliding scale NovoLog.  Hyperlipidemia -continue Lipitor  Peripheral vascular disease - s/p bilateral BKA's.  Continue aspirin and statin.  Seizure disorder -continue Keppra  Chronic respiratory failure with hypoxia - stable on 2 L/min supplemental oxygen.  Monitor.    DVT prophylaxis: on heparin gtt   Code Status: Full  Family Communication: none at bedside, will attempt to call  Disposition Plan: expect return to SNF pending cardiology and nephrology clearance  Consults called: cardiology, nephrology    Admission status:  Status is: Observation  The patient remains OBS appropriate and will d/c before 2 midnights.  Dispo: The patient is from: SNF              Anticipated d/c is to: SNF              Anticipated d/c date is: 1 day              Patient currently is not medically stable to d/c.    Ezekiel Slocumb, DO [facility]  11/03/2019, 2:09 PM    If 7PM-7AM, please contact night-coverage. How to contact the [facility] Attending or Consulting provider [place] or covering provider during after hours [place], for this patient?    1. Check the care team in [facility] and look for a) attending/consulting [facility] provider listed and b) the [facility] team listed 2. Log into [email or web address] and use [place]'s universal password to access. If you do not have the password, please contact the hospital operator. 3. Locate the [facility] provider you are looking for under [facility] and page to a number  that you can be directly reached. 4. If you still have difficulty reaching the provider, please page the [facility] (Director on Call) for the Hospitalists listed on amion for assistance.

## 2019-11-04 DIAGNOSIS — Z888 Allergy status to other drugs, medicaments and biological substances status: Secondary | ICD-10-CM | POA: Diagnosis not present

## 2019-11-04 DIAGNOSIS — Z20822 Contact with and (suspected) exposure to covid-19: Secondary | ICD-10-CM | POA: Diagnosis present

## 2019-11-04 DIAGNOSIS — J9611 Chronic respiratory failure with hypoxia: Secondary | ICD-10-CM

## 2019-11-04 DIAGNOSIS — I5042 Chronic combined systolic (congestive) and diastolic (congestive) heart failure: Secondary | ICD-10-CM | POA: Diagnosis present

## 2019-11-04 DIAGNOSIS — R079 Chest pain, unspecified: Secondary | ICD-10-CM | POA: Diagnosis present

## 2019-11-04 DIAGNOSIS — I248 Other forms of acute ischemic heart disease: Secondary | ICD-10-CM | POA: Diagnosis present

## 2019-11-04 DIAGNOSIS — E1151 Type 2 diabetes mellitus with diabetic peripheral angiopathy without gangrene: Secondary | ICD-10-CM | POA: Diagnosis present

## 2019-11-04 DIAGNOSIS — K219 Gastro-esophageal reflux disease without esophagitis: Secondary | ICD-10-CM | POA: Diagnosis present

## 2019-11-04 DIAGNOSIS — R519 Headache, unspecified: Secondary | ICD-10-CM | POA: Diagnosis present

## 2019-11-04 DIAGNOSIS — I160 Hypertensive urgency: Secondary | ICD-10-CM | POA: Diagnosis present

## 2019-11-04 DIAGNOSIS — I132 Hypertensive heart and chronic kidney disease with heart failure and with stage 5 chronic kidney disease, or end stage renal disease: Secondary | ICD-10-CM | POA: Diagnosis present

## 2019-11-04 DIAGNOSIS — Z6831 Body mass index (BMI) 31.0-31.9, adult: Secondary | ICD-10-CM | POA: Diagnosis not present

## 2019-11-04 DIAGNOSIS — Z794 Long term (current) use of insulin: Secondary | ICD-10-CM | POA: Diagnosis not present

## 2019-11-04 DIAGNOSIS — Z951 Presence of aortocoronary bypass graft: Secondary | ICD-10-CM | POA: Diagnosis not present

## 2019-11-04 DIAGNOSIS — E669 Obesity, unspecified: Secondary | ICD-10-CM | POA: Diagnosis present

## 2019-11-04 DIAGNOSIS — I252 Old myocardial infarction: Secondary | ICD-10-CM | POA: Diagnosis not present

## 2019-11-04 DIAGNOSIS — G40909 Epilepsy, unspecified, not intractable, without status epilepticus: Secondary | ICD-10-CM | POA: Diagnosis present

## 2019-11-04 DIAGNOSIS — Z9981 Dependence on supplemental oxygen: Secondary | ICD-10-CM | POA: Diagnosis not present

## 2019-11-04 DIAGNOSIS — E785 Hyperlipidemia, unspecified: Secondary | ICD-10-CM | POA: Diagnosis present

## 2019-11-04 DIAGNOSIS — N186 End stage renal disease: Secondary | ICD-10-CM | POA: Diagnosis present

## 2019-11-04 DIAGNOSIS — E1169 Type 2 diabetes mellitus with other specified complication: Secondary | ICD-10-CM | POA: Diagnosis present

## 2019-11-04 DIAGNOSIS — E1122 Type 2 diabetes mellitus with diabetic chronic kidney disease: Secondary | ICD-10-CM | POA: Diagnosis present

## 2019-11-04 DIAGNOSIS — N2581 Secondary hyperparathyroidism of renal origin: Secondary | ICD-10-CM | POA: Diagnosis present

## 2019-11-04 DIAGNOSIS — I2510 Atherosclerotic heart disease of native coronary artery without angina pectoris: Secondary | ICD-10-CM | POA: Diagnosis not present

## 2019-11-04 DIAGNOSIS — Z992 Dependence on renal dialysis: Secondary | ICD-10-CM | POA: Diagnosis not present

## 2019-11-04 DIAGNOSIS — D631 Anemia in chronic kidney disease: Secondary | ICD-10-CM | POA: Diagnosis present

## 2019-11-04 LAB — CBC
HCT: 29.6 % — ABNORMAL LOW (ref 36.0–46.0)
Hemoglobin: 9 g/dL — ABNORMAL LOW (ref 12.0–15.0)
MCH: 30.9 pg (ref 26.0–34.0)
MCHC: 30.4 g/dL (ref 30.0–36.0)
MCV: 101.7 fL — ABNORMAL HIGH (ref 80.0–100.0)
Platelets: 155 10*3/uL (ref 150–400)
RBC: 2.91 MIL/uL — ABNORMAL LOW (ref 3.87–5.11)
RDW: 17.9 % — ABNORMAL HIGH (ref 11.5–15.5)
WBC: 3.8 10*3/uL — ABNORMAL LOW (ref 4.0–10.5)
nRBC: 0 % (ref 0.0–0.2)

## 2019-11-04 LAB — HEMOGLOBIN A1C
Hgb A1c MFr Bld: 7 % — ABNORMAL HIGH (ref 4.8–5.6)
Mean Plasma Glucose: 154.2 mg/dL

## 2019-11-04 LAB — BASIC METABOLIC PANEL
Anion gap: 12 (ref 5–15)
BUN: 19 mg/dL (ref 8–23)
CO2: 28 mmol/L (ref 22–32)
Calcium: 8.6 mg/dL — ABNORMAL LOW (ref 8.9–10.3)
Chloride: 96 mmol/L — ABNORMAL LOW (ref 98–111)
Creatinine, Ser: 4.02 mg/dL — ABNORMAL HIGH (ref 0.44–1.00)
GFR calc Af Amer: 12 mL/min — ABNORMAL LOW (ref >60)
GFR calc non Af Amer: 11 mL/min — ABNORMAL LOW (ref >60)
Glucose, Bld: 147 mg/dL — ABNORMAL HIGH (ref 70–99)
Potassium: 3.9 mmol/L (ref 3.5–5.1)
Sodium: 136 mmol/L (ref 135–145)

## 2019-11-04 LAB — GLUCOSE, CAPILLARY
Glucose-Capillary: 112 mg/dL — ABNORMAL HIGH (ref 70–99)
Glucose-Capillary: 93 mg/dL (ref 70–99)
Glucose-Capillary: 147 mg/dL — ABNORMAL HIGH (ref 70–99)
Glucose-Capillary: 228 mg/dL — ABNORMAL HIGH (ref 70–99)

## 2019-11-04 LAB — MRSA PCR SCREENING: MRSA by PCR: NEGATIVE

## 2019-11-04 LAB — HEPARIN LEVEL (UNFRACTIONATED): Heparin Unfractionated: 2.38 IU/mL — ABNORMAL HIGH (ref 0.30–0.70)

## 2019-11-04 LAB — MAGNESIUM: Magnesium: 1.7 mg/dL (ref 1.7–2.4)

## 2019-11-04 LAB — APTT: aPTT: 40 seconds — ABNORMAL HIGH (ref 24–36)

## 2019-11-04 MED ORDER — ISOSORBIDE MONONITRATE ER 60 MG PO TB24
60.0000 mg | ORAL_TABLET | Freq: Every day | ORAL | Status: DC
  Administered 2019-11-04 – 2019-11-05 (×2): 60 mg via ORAL
  Filled 2019-11-04 (×2): qty 1

## 2019-11-04 MED ORDER — NICARDIPINE HCL IN NACL 40-0.83 MG/200ML-% IV SOLN
3.0000 mg/h | INTRAVENOUS | Status: DC
  Filled 2019-11-04: qty 200

## 2019-11-04 MED ORDER — HYDRALAZINE HCL 25 MG PO TABS: 25.0000 mg | ORAL_TABLET | Freq: Four times a day (QID) | ORAL | Status: DC | PRN

## 2019-11-04 MED ORDER — NICARDIPINE HCL IN NACL 20-0.86 MG/200ML-% IV SOLN
3.0000 mg/h | INTRAVENOUS | Status: DC
  Administered 2019-11-04: 5 mg/h via INTRAVENOUS
  Filled 2019-11-04 (×2): qty 200

## 2019-11-04 MED ORDER — APIXABAN 2.5 MG PO TABS
2.5000 mg | ORAL_TABLET | Freq: Two times a day (BID) | ORAL | Status: DC
  Administered 2019-11-04 – 2019-11-05 (×3): 2.5 mg via ORAL
  Filled 2019-11-04 (×3): qty 1

## 2019-11-04 MED ORDER — HYDRALAZINE HCL 50 MG PO TABS
50.0000 mg | ORAL_TABLET | Freq: Three times a day (TID) | ORAL | Status: DC
  Administered 2019-11-04 – 2019-11-05 (×4): 50 mg via ORAL
  Filled 2019-11-04 (×4): qty 1

## 2019-11-04 NOTE — Progress Notes (Signed)
[place] for heparin Indication: chest pain/ACS  Allergies  Allergen Reactions  . Chlorthalidone Anaphylaxis, Itching and Rash  . Fentanyl Rash  . Midazolam Rash  . Ace Inhibitors Other (See Comments)    Reaction:  Hyperkalemia, agitation   . Angiotensin Receptor Blockers Other (See Comments)    Reaction:  Hyperkalemia, agitation   . Norvasc [Amlodipine] Itching and Rash  . Phenergan [Promethazine Hcl] Anxiety    "antsy, can't sit still"    Patient Measurements: Height: 4' 4"  (132.1 cm) Weight: 54.4 kg (120 lb) IBW/kg (Calculated) : 27.1 Heparin Dosing Weight: 40 kg  Vital Signs: Temp: 97.9 F (36.6 C) (07/15 0300) Temp Source: Oral (07/15 0300) BP: 130/53 (07/15 0500) Pulse Rate: 61 (07/15 0500)  Labs: Recent Labs    11/03/19 1101 11/03/19 1356 11/04/19 0330  HGB 9.0*  --  9.0*  HCT 29.4*  --  29.6*  PLT 157  --  155  APTT  --  38* 40*  LABPROT  --  18.3*  --   INR  --  1.6*  --   CREATININE 7.58*  --  4.02*  TROPONINIHS 58* 58*  --     Estimated Creatinine Clearance: 8 mL/min (A) (by C-G formula based on SCr of 4.02 mg/dL (H)).   Medical History: Past Medical History:  Diagnosis Date  . (HFpEF) heart failure with preserved ejection fraction ([place])    a. 01/2018 Echo: EF 50-55%, no rwma, Gr1 DD, triv AI, mild MR. Midly dil LA/RV, mod reduced RV fxn. Irreg thickening of TV w/ mobile echodensity that appears to arise from valve.  . Anemia   . Anorexia   . Bacteremia due to Pseudomonas   . Carotid arterial disease ([place])    a. 01/2017 Carotid U/S: RICA <24, LICA <09.  Marland Kitchen CHF (congestive heart failure) ([place])   . Complication of anesthesia    a. Pt reports h/o complication on 9 different occasions - ? hypotension/arrest.  . 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 01/2015 Cath [facility]): 70% LM, p-mLAD 50-60% (Resting FFR 0.75), mRCA 80-90%, ~40 Ost OM & D1-->CABG; b. 01/2016 Staged PCI of LCX  x 2 and RCA.  Marland Kitchen ESRD (end stage renal disease) on dialysis ([place])    a. ESRD secondary to acute kidney failure s/p CABG-->PD  . Essential hypertension   . GERD (gastroesophageal reflux disease)   . Heart murmur   . Hypercholesterolemia   . Myocardial infarction ([place]) 2016  . PVD (peripheral vascular disease) ([place])    a. 01/30/2018 PV Angio: Sev Left Popliteal dzs s/p PTA w/ 76m lutonix DEB w/ mech aspiration of the L popliteal, L AT, and Left tibioperoneal trunck and peroneal artery.  . 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  [facility]: Dr. BMarland KitchenHaithcock: CABG x 3, LIMA to LAD, SVG to RCA, SVG to OM3, EVH  . Sinusitis 2019  . Type II diabetes mellitus with complication (HCC)    CAD     Assessment: 69year old female with SOB. Patient on Eliquis PTA with last dose 7/14 am. Pharmacy consulted for heparin drip for ACS.  Goal of Therapy:  Heparin level 0.3-0.7 units/ml aPTT 66-102 seconds Monitor platelets by anticoagulation protocol: Yes   Plan:  07/15 @ 0330 aPTT 40 seconds subtherapeutic. Will increase rate to 550 units/hr and will recheck aPTT at 1100, CBC low stable will continue to monitor.  DTobie Lords PharmD 11/04/2019,5:08 AM

## 2019-11-04 NOTE — Progress Notes (Signed)
[facility]  ROUNDING NOTE   Subjective:  Pt well known to Stephanie Ellis.  Underwent HD yesterday.   Came in with shortness of breath and chest pain yesterday.  Blood pressure improved.    Objective:  Vital signs in last 24 hours:  Temp:  [97.5 F (36.4 C)-98.3 F (36.8 C)] 98.3 F (36.8 C) (07/15 1200) Pulse Rate:  [53-66] 53 (07/15 1600) Resp:  [8-21] 8 (07/15 1600) BP: (107-211)/(51-101) 150/58 (07/15 1600) SpO2:  [88 %-100 %] 100 % (07/15 1600)  Weight change:  Filed Weights   11/03/19 1059  Weight: 54.4 kg    Intake/Output: I/O last 3 completed shifts: In: 380.9 [I.V.:380.9] Out: -    Intake/Output this shift:  Total I/O In: 535.6 [P.O.:480; I.V.:55.6] Out: -   Physical Exam: General: No acute distress  Head: Normocephalic, atraumatic. Moist oral mucosal membranes  Eyes: Anicteric  Neck: Supple, trachea midline  Lungs:  Clear to auscultation, normal effort  Heart: S1S2 no rubs  Abdomen:  Soft, nontender, bowel sounds present  Extremities: B/L AKA  Neurologic: Awake, alert, following commands  Skin: No lesions  Access: Permcath    Basic Metabolic Panel: Recent Labs  Lab 11/03/19 1101 11/04/19 0330  NA 135 136  K 4.6 3.9  CL 92* 96*  CO2 27 28  GLUCOSE 144* 147*  BUN 45* 19  CREATININE 7.58* 4.02*  CALCIUM 8.7* 8.6*  MG  --  1.7    Liver Function Tests: No results for input(s): AST, ALT, ALKPHOS, BILITOT, PROT, ALBUMIN in the last 168 hours. No results for input(s): LIPASE, AMYLASE in the last 168 hours. No results for input(s): AMMONIA in the last 168 hours.  CBC: Recent Labs  Lab 11/03/19 1101 11/04/19 0330  WBC 5.5 3.8*  HGB 9.0* 9.0*  HCT 29.4* 29.6*  MCV 101.7* 101.7*  PLT 157 155    Cardiac Enzymes: No results for input(s): CKTOTAL, CKMB, CKMBINDEX, TROPONINI in the last 168 hours.  BNP: Invalid input(s): POCBNP  CBG: Recent Labs  Lab 11/03/19 2229 11/04/19 0723 11/04/19 1117 11/04/19 1530  GLUCAP 87 112* 93  147*    Microbiology: Results for orders placed or performed during the hospital encounter of 11/03/19  SARS Coronavirus 2 by RT PCR (hospital order, performed in [facility] hospital lab) Nasopharyngeal Nasopharyngeal Swab     Status: None   Collection Time: 11/03/19  1:45 PM   Specimen: Nasopharyngeal Swab  Result Value Ref Range Status   SARS Coronavirus 2 NEGATIVE NEGATIVE Final    Comment: (NOTE) SARS-CoV-2 target nucleic acids are NOT DETECTED.  The SARS-CoV-2 RNA is generally detectable in upper and lower respiratory specimens during the acute phase of infection. The lowest concentration of SARS-CoV-2 viral copies this assay can detect is 250 copies / mL. A negative result does not preclude SARS-CoV-2 infection and should not be used as the sole basis for treatment or other patient management decisions.  A negative result may occur with improper specimen collection / handling, submission of specimen other than nasopharyngeal swab, presence of viral mutation(s) within the areas targeted by this assay, and inadequate number of viral copies (<250 copies / mL). A negative result must be combined with clinical observations, patient history, and epidemiological information.  Fact Sheet for Patients:   StrictlyIdeas.no  Fact Sheet for Healthcare Providers: BankingDealers.co.za  This test is not yet approved or  cleared by the [place] FDA and has been authorized for detection and/or diagnosis of SARS-CoV-2 by FDA under an Emergency Use  Authorization (EUA).  This EUA will remain in effect (meaning this test can be used) for the duration of the COVID-19 declaration under Section 564(b)(1) of the Act, 21 U.S.C. section 360bbb-3(b)(1), unless the authorization is terminated or revoked sooner.  Performed at [facility], [address] [place], [place] [address]   MRSA PCR Screening     Status: None   Collection Time:  11/03/19 10:45 PM   Specimen: Nasopharyngeal  Result Value Ref Range Status   MRSA by PCR NEGATIVE NEGATIVE Final    Comment:        The GeneXpert MRSA Assay (FDA approved for NASAL specimens only), is one component of a comprehensive MRSA colonization surveillance program. It is not intended to diagnose MRSA infection nor to guide or monitor treatment for MRSA infections. Performed at [facility], [address] [place], [place] [address]     Coagulation Studies: Recent Labs    11/03/19 1356  LABPROT 18.3*  INR 1.6*    Urinalysis: No results for input(s): COLORURINE, LABSPEC, PHURINE, GLUCOSEU, HGBUR, BILIRUBINUR, KETONESUR, PROTEINUR, UROBILINOGEN, NITRITE, LEUKOCYTESUR in the last 72 hours.  Invalid input(s): APPERANCEUR    Imaging: DG Chest 2 View  Result Date: 11/03/2019 CLINICAL DATA:  Shortness of breath. EXAM: CHEST - 2 VIEW COMPARISON:  CT chest and chest x-ray dated Sep 02, 2019. FINDINGS: Unchanged tunneled left internal jugular dialysis catheter with tip in the right atrium. Stable cardiomegaly status post CABG. Similar appearing mild diffuse interstitial thickening and small bilateral pleural effusions with bibasilar atelectasis. No pneumothorax. No acute osseous abnormality. IMPRESSION: 1. Unchanged mild congestive heart failure. Electronically Signed   By: Titus Dubin M.D.   On: 11/03/2019 12:32     Medications:    . apixaban  2.5 mg Oral BID  . aspirin EC  81 mg Oral Daily  . atorvastatin  80 mg Oral QHS  . brimonidine  1 drop Both Eyes TID  . carvedilol  25 mg Oral BID WC  . Chlorhexidine Gluconate Cloth  6 each Topical Q0600  . fluticasone  2 spray Each Nare Daily  . hydrALAZINE  50 mg Oral Q8H  . insulin aspart  0-6 Units Subcutaneous TID AC & HS  . insulin glargine  5 Units Subcutaneous QHS  . ipratropium  2 spray Each Nare BID  . isosorbide mononitrate  60 mg Oral Daily  . levETIRAcetam  250 mg Oral Q T,Th,Sa-HD  .  mirtazapine  7.5 mg Oral Daily  . pantoprazole  40 mg Oral Daily  . polyvinyl alcohol  1 drop Both Eyes BID  . rOPINIRole  0.25 mg Oral QHS  . sevelamer carbonate  800 mg Oral TID WC  . timolol  1 drop Both Eyes TID  . vitamin B-12  1,000 mcg Oral Daily   acetaminophen, bisacodyl, cloNIDine, diphenhydrAMINE, hydrALAZINE, meclizine, nitroGLYCERIN, ondansetron (ZOFRAN) IV, polyethylene glycol, traMADol  Assessment/ Plan:  69 y.o. female with end stage renal disease on hemodialysis, hypertension, peripheral vascular disease, bilateral below the knee amputation, coronary disease status post CABG, diabetes mellitus type 2, history of endocarditis, congestive heart failure admitted with  Hypertensive urgency [I16.0] Chest pain [R07.9] Chest pain, unspecified type [R07.9]   [facility] nephrology MWF [facility] left IJ PermCath   1.  ESRD on HD MWF.  Patient underwent hemodialysis yesterday.  No acute indication for dialysis today.  We will plan for hemodialysis again tomorrow.  2.  Anemia of chronic kidney disease. Lab Results  Component Value Date  HGB 9.0 (L) 11/04/2019  Consider starting the patient on Epogen tomorrow.  3.  Secondary hyperparathyroidism.  Maintain the patient on current doses of Renvela and monitor abdominal metabolism parameters over the course of hospitalization.  4.  Hypertension.  Blood pressure currently 150/58.  Maintain the patient on current doses of carvedilol, hydralazine.    LOS: 0 Kien Mirsky 7/15/20215:06 PM

## 2019-11-04 NOTE — Progress Notes (Signed)
Patients blood pressure remained elevated despite increasing nitroglycerin gtt. Sharion Settler, NP notified. Nitroglycerin gtt discontinued, cardene gtt ordered and started.

## 2019-11-04 NOTE — Hospital Course (Signed)
Jacarra Bobak is a 69 y.o. female with medical history significant of ESRD on dialysis MWF, CAD status post three-vessel CABG, chronic hypoxic respiratory failure on 2 L/min supplemental oxygen, type 2 diabetes, combined systolic/diastolic CHF, peripheral vascular disease, hypertension, hyperlipidemia presented to the ED on 11/03/19  with complaints of headache, fatigue and shortness of breath in the setting of chest pressure/heaviness.  Was unable to attend routine dialysis day of admission due to feeling unwell.  In the ED, afebrile, initial BP 179/75, later BP was over 199 systolic.  Oxygen saturation 97% on chronic 2 L/min oxygen.  Labs notable for stable chronic anemia, end-stage renal disease and troponin mildly elevated at 58.  Chest x-ray showed mild CHF with no changes from prior study.  EKG was concerning for inverted T waves in the inferolateral leads.  Cardiology was consulted and recommended heparin infusion and admission for further evaluation of chest pain.

## 2019-11-04 NOTE — Progress Notes (Signed)
Patient transferred to 2A 246 from ICU. No complaints at this time.

## 2019-11-04 NOTE — Progress Notes (Addendum)
PROGRESS NOTE    Stephanie Ellis   PYK:[id number]  DOB: Dec 04, 1950  PCP: Kirk Ruths, MD    DOA: 11/03/2019 LOS: 0   Brief Narrative   Stephanie Ellis is a 69 y.o. female with medical history significant of ESRD on dialysis MWF, CAD status post three-vessel CABG, chronic hypoxic respiratory failure on 2 L/min supplemental oxygen, type 2 diabetes, combined systolic/diastolic CHF, peripheral vascular disease, hypertension, hyperlipidemia presented to the ED on 11/03/19  with complaints of headache, fatigue and shortness of breath in the setting of chest pressure/heaviness.  Was unable to attend routine dialysis day of admission due to feeling unwell.  In the ED, afebrile, initial BP 179/75, later BP was over 539 systolic.  Oxygen saturation 97% on chronic 2 L/min oxygen.  Labs notable for stable chronic anemia, end-stage renal disease and troponin mildly elevated at 58.  Chest x-ray showed mild CHF with no changes from prior study.  EKG was concerning for inverted T waves in the inferolateral leads.  Cardiology was consulted and recommended heparin infusion and admission for further evaluation of chest pain.     Assessment & Plan   Principal Problem:   Chest pain Active Problems:   Elevated troponin   ESRD (end stage renal disease) on dialysis [facility])   Hypertensive urgency   Coronary artery disease involving left main coronary artery   S/P CABG x 3   Hypertension   Type 2 diabetes mellitus with hyperlipidemia (HCC)   Hyperlipidemia   Peripheral vascular disease (HCC)   Status post below-knee amputation of both lower extremities (HCC)   Seizure disorder (HCC)   Chronic respiratory failure with hypoxia ([place])   Chest pain - Resolved.  Present on admission, described as heaviness, in setting of uncontrolled BP and elevated troponin and T wave inversions of the inferolateral leads on ECG.  ED physician discussed case with cardiology who recommended admission, heparin infusion, and  will consult.  Stat EKG if recurrent chest pain.  PRN sublingual nitro for chest pain. Per cardiology, stop heparin, increase Imdur.  No cath or further diagnostics planned at this time.  Telemetry.  NSTEMI vs Elevated troponin -present on admission, initial troponin 58, T wave inversions on ECG.  Continue to trend troponin until decreasing cardiology is following.  Trend remained flat and chest pain resolved.  Likely demand ischemia.  ESRD on dialysis MWF  -nephrology is consulted for dialysis.  Patient did not attend her session today.  Renal function and electrolytes.     Hypertensive urgency - on admission, systolic BPs over 767 in the ED, and remained elevated despite being given her home blood pressure medications.  Started on nitro drip, later Cardene drip.  Stop Cardene gtt.  Continue home meds as below.     CAD s/p 3 vessel CABG - presented with chest pain.  Evaluation as above.  Continue aspirin and Lipitor.  Hypertension -uncontrolled on admission as above.  Continue home meds Coreg, clonidine.  Added hydralazine 50 mg TID.  Type 2 diabetes -continue home Lantus 5 units, sliding scale NovoLog.  Anemia of chronic kidney disease - stable, no signs of bleeding. Monitor CBC.    Hyperlipidemia -continue Lipitor  Peripheral vascular disease - s/p bilateral BKA's.  Continue aspirin and statin.  Seizure disorder -continue Keppra  Chronic respiratory failure with hypoxia - stable on 2 L/min supplemental oxygen.  Monitor.  Obesity: Body mass index is 31.2 kg/m.  Complicates overall care and prognosis.   DVT prophylaxis: apixaban (ELIQUIS) tablet 2.5 mg  Start: 11/04/19 1145 apixaban (ELIQUIS) tablet 2.5 mg   Diet:  Diet Orders (From admission, onward)    Start     Ordered   11/04/19 1132  Diet heart healthy/carb modified Room service appropriate? Yes; Fluid consistency: Thin  Diet effective now       Question Answer Comment  Diet-HS Snack? Nothing   Room service  appropriate? Yes   Fluid consistency: Thin      11/04/19 1131            Code Status: Full Code    Subjective 11/04/19    Patient seen at bedside in stepdown.  She reports feeling very tired but denies further chest pain or discomfort.  Says her chest just feels congested.  No fevers or chills.  Denies other acute complaints including N/V/D, dysuria or headache.   Disposition Plan & Communication   Status is: Inpatient  Remains inpatient appropriate because:Inpatient level of care appropriate due to severity of illness.  Patient just off Cardene gtt for hypertensive crisis, requires hospital monitoring of BP while adjustment oral medications for BP control.   Dispo: The patient is from: Home              Anticipated d/c is to: Home              Anticipated d/c date is: 1 day              Patient currently is not medically stable to d/c.    Family Communication: none at bedside, will attempt to call    Consults, Procedures, Significant Events   Consultants:   Nephrology  Cardiology  Antimicrobials:   none   Objective   Vitals:   11/04/19 0900 11/04/19 1000 11/04/19 1100 11/04/19 1200  BP: (!) 154/58 (!) 107/92 (!) 127/51 (!) 113/101  Pulse: 63 60 (!) 59 (!) 59  Resp: 10 20 (!) 9 (!) 21  Temp:    98.3 F (36.8 C)  TempSrc:    Oral  SpO2: 95% 96% 100% 100%  Weight:      Height:        Intake/Output Summary (Last 24 hours) at 11/04/2019 1603 Last data filed at 11/04/2019 1310 Gross per 24 hour  Intake 916.49 ml  Output --  Net 916.49 ml   Filed Weights   11/03/19 1059  Weight: 54.4 kg    Physical Exam:  Constitutional: NAD, calm, comfortable Respiratory: Decreased breath sounds with faint crackles, no wheezing. Normal respiratory effort. No accessory muscle use.   Cardiovascular: RRR, no murmurs / rubs / gallops. Anterior left upper chest with permacath in place.  Abdomen: soft, NT, ND, no masses or HSM palpated. +Bowel sounds.  Extremities:  Bilateral BKA 's, no edema. Normal muscle tone.  Neurologic: CN 2-12 grossly intact. Normal speech.  Grossly non-focal exam. Psychiatric: Alert and oriented x 3. Normal mood. Congruent affect.    Labs   Data Reviewed: I have personally reviewed following labs and imaging studies  CBC: Recent Labs  Lab 11/03/19 1101 11/04/19 0330  WBC 5.5 3.8*  HGB 9.0* 9.0*  HCT 29.4* 29.6*  MCV 101.7* 101.7*  PLT 157 154   Basic Metabolic Panel: Recent Labs  Lab 11/03/19 1101 11/04/19 0330  NA 135 136  K 4.6 3.9  CL 92* 96*  CO2 27 28  GLUCOSE 144* 147*  BUN 45* 19  CREATININE 7.58* 4.02*  CALCIUM 8.7* 8.6*  MG  --  1.7   GFR: Estimated Creatinine Clearance: 8 mL/min (  A) (by C-G formula based on SCr of 4.02 mg/dL (H)). Liver Function Tests: No results for input(s): AST, ALT, ALKPHOS, BILITOT, PROT, ALBUMIN in the last 168 hours. No results for input(s): LIPASE, AMYLASE in the last 168 hours. No results for input(s): AMMONIA in the last 168 hours. Coagulation Profile: Recent Labs  Lab 11/03/19 1356  INR 1.6*   Cardiac Enzymes: No results for input(s): CKTOTAL, CKMB, CKMBINDEX, TROPONINI in the last 168 hours. BNP (last 3 results) No results for input(s): PROBNP in the last 8760 hours. HbA1C: Recent Labs    11/03/19 1101  HGBA1C 7.0*   CBG: Recent Labs  Lab 11/03/19 2229 11/04/19 0723 11/04/19 1117 11/04/19 1530  GLUCAP 87 112* 93 147*   Lipid Profile: No results for input(s): CHOL, HDL, LDLCALC, TRIG, CHOLHDL, LDLDIRECT in the last 72 hours. Thyroid Function Tests: No results for input(s): TSH, T4TOTAL, FREET4, T3FREE, THYROIDAB in the last 72 hours. Anemia Panel: No results for input(s): VITAMINB12, FOLATE, FERRITIN, TIBC, IRON, RETICCTPCT in the last 72 hours. Sepsis Labs: No results for input(s): PROCALCITON, LATICACIDVEN in the last 168 hours.  Recent Results (from the past 240 hour(s))  SARS Coronavirus 2 by RT PCR (hospital order, performed in [facility] hospital lab) Nasopharyngeal Nasopharyngeal Swab     Status: None   Collection Time: 11/03/19  1:45 PM   Specimen: Nasopharyngeal Swab  Result Value Ref Range Status   SARS Coronavirus 2 NEGATIVE NEGATIVE Final    Comment: (NOTE) SARS-CoV-2 target nucleic acids are NOT DETECTED.  The SARS-CoV-2 RNA is generally detectable in upper and lower respiratory specimens during the acute phase of infection. The lowest concentration of SARS-CoV-2 viral copies this assay can detect is 250 copies / mL. A negative result does not preclude SARS-CoV-2 infection and should not be used as the sole basis for treatment or other patient management decisions.  A negative result may occur with improper specimen collection / handling, submission of specimen other than nasopharyngeal swab, presence of viral mutation(s) within the areas targeted by this assay, and inadequate number of viral copies (<250 copies / mL). A negative result must be combined with clinical observations, patient history, and epidemiological information.  Fact Sheet for Patients:   StrictlyIdeas.no  Fact Sheet for Healthcare Providers: BankingDealers.co.za  This test is not yet approved or  cleared by the [place] FDA and has been authorized for detection and/or diagnosis of SARS-CoV-2 by FDA under an Emergency Use Authorization (EUA).  This EUA will remain in effect (meaning this test can be used) for the duration of the COVID-19 declaration under Section 564(b)(1) of the Act, 21 U.S.C. section 360bbb-3(b)(1), unless the authorization is terminated or revoked sooner.  Performed at [facility], [address] [place], [place] [address]   MRSA PCR Screening     Status: None   Collection Time: 11/03/19 10:45 PM   Specimen: Nasopharyngeal  Result Value Ref Range Status   MRSA by PCR NEGATIVE NEGATIVE Final    Comment:        The GeneXpert MRSA Assay  (FDA approved for NASAL specimens only), is one component of a comprehensive MRSA colonization surveillance program. It is not intended to diagnose MRSA infection nor to guide or monitor treatment for MRSA infections. Performed at [facility], [address], [place],  [address]       Imaging Studies   DG Chest 2 View  Result Date: 11/03/2019 CLINICAL DATA:  Shortness of breath. EXAM: CHEST - 2 VIEW  COMPARISON:  CT chest and chest x-ray dated Sep 02, 2019. FINDINGS: Unchanged tunneled left internal jugular dialysis catheter with tip in the right atrium. Stable cardiomegaly status post CABG. Similar appearing mild diffuse interstitial thickening and small bilateral pleural effusions with bibasilar atelectasis. No pneumothorax. No acute osseous abnormality. IMPRESSION: 1. Unchanged mild congestive heart failure. Electronically Signed   By: Titus Dubin M.D.   On: 11/03/2019 12:32     Medications   Scheduled Meds: . apixaban  2.5 mg Oral BID  . aspirin EC  81 mg Oral Daily  . atorvastatin  80 mg Oral QHS  . brimonidine  1 drop Both Eyes TID  . carvedilol  25 mg Oral BID WC  . Chlorhexidine Gluconate Cloth  6 each Topical Q0600  . fluticasone  2 spray Each Nare Daily  . hydrALAZINE  50 mg Oral Q8H  . insulin aspart  0-6 Units Subcutaneous TID AC & HS  . insulin glargine  5 Units Subcutaneous QHS  . ipratropium  2 spray Each Nare BID  . isosorbide mononitrate  60 mg Oral Daily  . levETIRAcetam  250 mg Oral Q T,Th,Sa-HD  . mirtazapine  7.5 mg Oral Daily  . pantoprazole  40 mg Oral Daily  . polyvinyl alcohol  1 drop Both Eyes BID  . rOPINIRole  0.25 mg Oral QHS  . sevelamer carbonate  800 mg Oral TID WC  . timolol  1 drop Both Eyes TID  . vitamin B-12  1,000 mcg Oral Daily   Continuous Infusions:     LOS: 0 days    Time spent: 30 minutes    Ezekiel Slocumb, DO [facility]  11/04/2019, 4:03 PM    If 7PM-7AM, please contact  night-coverage. How to contact the [facility] Attending or Consulting provider [place] or covering provider during after hours [place], for this patient?    1. Check the care team in [facility] and look for a) attending/consulting [facility] provider listed and b) the [facility] team listed 2. Log into [email or web address] and use [place]'s universal password to access. If you do not have the password, please contact the hospital operator. 3. Locate the [facility] provider you are looking for under [facility] and page to a number that you can be directly reached. 4. If you still have difficulty reaching the provider, please page the [facility] (Director on Call) for the Hospitalists listed on amion for assistance.

## 2019-11-04 NOTE — Consult Note (Signed)
[facility]  CARDIOLOGY CONSULT NOTE  Patient ID: Stephanie Ellis MRN: [id number] DOB/AGE: 1951-02-15 69 y.o.  Admit date: 11/03/2019 Referring Physician [place] Ped Primary Physician [facility] Primary Cardiologist  Reason for Consultation chest pain  HPI: 69 year old female referred for evaluation of chest pain.  Patient has known coronary artery disease, status post prior PCI and CABG times 312/05/2014.  She has a history of end-stage renal disease on chronic hemodialysis, COPD on 2L supplemental oxygen, combined chronic systolic and diastolic CHF and essential hypertension.  The patient was in her usual state of health until yesterday, awoke with a nosebleed, developed shortness of breath with associated chest heaviness.  The patient denies chest pain.  She was brought to [facility] via EMS.  ECG revealed sinus rhythm at a rate of 66 bpm, right bundle branch block, with associated ST-T wave abnormalities inferolaterally.  Prior ECG revealed sinus rhythm with nonspecific IVCD with T wave abnormalities.  High-sensitivity troponin was borderline elevated 58, 58, without significant delta.  During hospitalization, systolic blood pressure was markedly elevated, requiring nicardipine infusion with overall improvement.  Review of systems complete and found to be negative unless listed above     Past Medical History:  Diagnosis Date  . (HFpEF) heart failure with preserved ejection fraction ([place])    a. 01/2018 Echo: EF 50-55%, no rwma, Gr1 DD, triv AI, mild MR. Midly dil LA/RV, mod reduced RV fxn. Irreg thickening of TV w/ mobile echodensity that appears to arise from valve.  . Anemia   . Anorexia   . Bacteremia due to Pseudomonas   . Carotid arterial disease ([place])    a. 01/2017 Carotid U/S: RICA <83, LICA <15.  Marland Kitchen CHF (congestive heart failure) ([place])   . Complication of anesthesia    a. Pt reports h/o complication on 9 different occasions - ? hypotension/arrest.  . Coronary artery disease involving left  main coronary artery 01/2015   a. 01/2015 Cath [facility]): 70% LM, p-mLAD 50-60% (Resting FFR 0.75), mRCA 80-90%, ~40 Ost OM & D1-->CABG; b. 01/2016 Staged PCI of LCX x 2 and RCA.  Marland Kitchen ESRD (end stage renal disease) on dialysis ([place])    a. ESRD secondary to acute kidney failure s/p CABG-->PD  . Essential hypertension   . GERD (gastroesophageal reflux disease)   . Heart murmur   . Hypercholesterolemia   . Myocardial infarction ([place]) 2016  . PVD (peripheral vascular disease) ([place])    a. 01/30/2018 PV Angio: Sev Left Popliteal dzs s/p PTA w/ 40m lutonix DEB w/ mech aspiration of the L popliteal, L AT, and Left tibioperoneal trunck and peroneal artery.  . 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  [facility]: Dr. BMarland KitchenHaithcock: CABG x 3, LIMA to LAD, SVG to RCA, SVG to OM3, EVH  . Sinusitis 2019  . Type II diabetes mellitus with complication (HCC)    CAD    Past Surgical History:  Procedure Laterality Date  . ABDOMINAL HYSTERECTOMY    . AMPUTATION Right 02/25/2018   Procedure: AMPUTATION BELOW KNEE;  Surgeon: SKatha Cabal MD;  Location: [facility] ORS;  Service: Vascular;  Laterality: Right;  . AMPUTATION Left 03/11/2018   Procedure: AMPUTATION BELOW KNEE;  Surgeon: DAlgernon Huxley MD;  Location: [facility] ORS;  Service: Vascular;  Laterality: Left;  . AMPUTATION Bilateral 04/13/2018   Procedure: REVISION OF BILATERAL AMPUTATIONS BELOW KNEE WITH WOUND VAC APPLICATIONS;  Surgeon: EEvaristo Bury MD;  Location: [facility] ORS;  Service: Vascular;  Laterality: Bilateral;  . AMPUTATION TOE Left 02/06/2018  Procedure: AMPUTATION GREAT TOE;  Surgeon: Samara Deist, DPM;  Location: [facility] ORS;  Service: Podiatry;  Laterality: Left;  . APPLICATION OF WOUND VAC Left 04/13/2018   Procedure: APPLICATION OF WOUND VAC;  Surgeon: Evaristo Bury, MD;  Location: [facility] ORS;  Service: Vascular;  Laterality: Left;  . ARTERIOVENOUS GRAFT PLACEMENT  05/2016  . AV FISTULA PLACEMENT Left 05/30/2016   Procedure: ARTERIOVENOUS graft;  Surgeon:  Algernon Huxley, MD;  Location: [facility] OR[facility];  Service: Vascular;  Laterality: Left;  . CAPD INSERTION N/A 10/30/2017   Procedure: LAPAROSCOPIC INSERTION CONTINUOUS AMBULATORY PERITONEAL DIALYSIS  (CAPD) CATHETER;  Surgeon: Algernon Huxley, MD;  Location: [facility] OR[facility];  Service: Vascular;  Laterality: N/A;  . CARDIAC CATHETERIZATION  01/2015   [facility]: Ost LM 70%, p-m LAD 50-60% (Rest FFR + @ 0.75), mRCA 80-90%, ostD1 40%, pOM1 40%  . CATARACT EXTRACTION W/PHACO Left 01/18/2016   Procedure: CATARACT EXTRACTION PHACO AND INTRAOCULAR LENS PLACEMENT (IOC);  Surgeon: Eulogio Bear, MD;  Location: [facility] OR[facility];  Service: Ophthalmology;  Laterality: Left;  [place] 1.05 AP% 15.5 CDE 10.16 Fluid Pack Lot # [id number]  . CATARACT EXTRACTION W/PHACO Right 08/01/2016   Procedure: CATARACT EXTRACTION PHACO AND INTRAOCULAR LENS PLACEMENT (IOC);  Surgeon: Eulogio Bear, MD;  Location: [facility];  Service: Ophthalmology;  Laterality: Right;  [place] 01:00.6 AP% 11.4 CDE 6.93  LOT # [id number]  . COLONOSCOPY    . CORONARY ANGIOPLASTY     SENTS 02/12/16  . CORONARY ARTERY BYPASS GRAFT  03/28/15    [facility]: Dr. Waldemar Dickens: LIMA to LAD, SVG to RCA, SVG to OM3, EVH  . DIALYSIS/PERMA CATHETER INSERTION N/A 02/11/2018   Procedure: DIALYSIS/PERMA CATHETER INSERTION;  Surgeon: Algernon Huxley, MD;  Location: [place] [facility];  Service: Cardiovascular;  Laterality: N/A;  . DIALYSIS/PERMA CATHETER REMOVAL Right 02/04/2018   Procedure: DIALYSIS/PERMA CATHETER REMOVAL;  Surgeon: Katha Cabal, MD;  Location: [place] [facility];  Service: Cardiovascular;  Laterality: Right;  . ESOPHAGOGASTRODUODENOSCOPY (EGD) WITH PROPOFOL N/A 10/24/2016   Procedure: ESOPHAGOGASTRODUODENOSCOPY (EGD) WITH PROPOFOL;  Surgeon: [name], MD;  Location: [facility];  Service: Endoscopy;  Laterality: N/A;  . EYE SURGERY Bilateral    cataract surgery  . EYE SURGERY     drains for glaucoma  . INSERTION EXPRESS TUBE SHUNT Right 08/01/2016   Procedure: INSERTION  EXPRESS TUBE SHUNT;  Surgeon: Eulogio Bear, MD;  Location: [facility];  Service: Ophthalmology;  Laterality: Right;  . INSERTION OF AHMED VALVE Left 08/15/2016   Procedure: INSERTION OF AHMED VALVE;  Surgeon: Eulogio Bear, MD;  Location: [facility];  Service: Ophthalmology;  Laterality: Left;  . INSERTION OF DIALYSIS CATHETER    . LOWER EXTREMITY ANGIOGRAPHY Right 02/19/2018   Procedure: Lower Extremity Angiography;  Surgeon: Algernon Huxley, MD;  Location: [place] [facility];  Service: Cardiovascular;  Laterality: Right;  . LOWER EXTREMITY ANGIOGRAPHY Left 01/30/2018   Procedure: Lower Extremity Angiography;  Surgeon: Katha Cabal, MD;  Location: [place] [facility];  Service: Cardiovascular;  Laterality: Left;  . REMOVAL OF A DIALYSIS CATHETER N/A 02/25/2018   Procedure: REMOVAL OF A DIALYSIS CATHETER;  Surgeon: Katha Cabal, MD;  Location: [facility];  Service: Vascular;  Laterality: N/A;  . TEMPORARY DIALYSIS CATHETER N/A 02/06/2018   Procedure: TEMPORARY DIALYSIS CATHETER;  Surgeon: Katha Cabal, MD;  Location: [place] [facility];  Service: Cardiovascular;  Laterality: N/A;  . TRANSTHORACIC ECHOCARDIOGRAM  January 2017    EF 60-65%. GR 2  DD. Mild degenerative mitral valve disease but no prolapse or regurgitation. Mild left atrial dilation. Mild to moderate LVH. Pericardial effusion gone  . UPPER EXTREMITY ANGIOGRAPHY Left 06/27/2016   Procedure: Upper Extremity Angiography;  Surgeon: Algernon Huxley, MD;  Location: [place] [facility];  Service: Cardiovascular;  Laterality: Left;  . UPPER EXTREMITY ANGIOGRAPHY Left 08/22/2016   Procedure: Upper Extremity Angiography;  Surgeon: Algernon Huxley, MD;  Location: [place] [facility];  Service: Cardiovascular;  Laterality: Left;  Marland Kitchen VASCULAR SURGERY    . WOUND DEBRIDEMENT Left 05/08/2018   Procedure: DEBRIDEMENT OF LEFT LEG BKA;  Surgeon: Algernon Huxley, MD;  Location: [facility];  Service: General;  Laterality: Left;    Medications Prior  to Admission  Medication Sig Dispense Refill Last Dose  . apixaban (ELIQUIS) 2.5 MG TABS tablet Take 1 tablet (2.5 mg total) by mouth 2 (two) times daily. 60 tablet 0   . atorvastatin (LIPITOR) 80 MG tablet Take 80 mg by mouth at bedtime.      . carvedilol (COREG) 25 MG tablet Take 1 tablet (25 mg total) by mouth 2 (two) times daily. 60 tablet 0   . cloNIDine (CATAPRES) 0.1 MG tablet Take 0.1 mg by mouth daily as needed (BP >160/80).     . fluticasone (FLONASE) 50 MCG/ACT nasal spray Place 2 sprays into both nostrils daily.     Marland Kitchen ipratropium (ATROVENT) 0.06 % nasal spray Place 2 sprays into both nostrils 2 (two) times daily. 15 mL 0   . isosorbide mononitrate (IMDUR) 30 MG 24 hr tablet Take 30 mg by mouth daily.      Marland Kitchen levETIRAcetam (KEPPRA) 250 MG tablet Three times weekly after dialysis (Patient taking differently: Take 250 mg by mouth Every Tuesday,Thursday,and Saturday with dialysis. )     . levETIRAcetam (KEPPRA) 500 MG tablet Take 1 tablet (500 mg total) by mouth 2 (two) times daily.     . mirtazapine (REMERON) 15 MG tablet Take 7.5 mg by mouth daily.      . pantoprazole (PROTONIX) 40 MG tablet Take 1 tablet (40 mg total) by mouth daily. 30 tablet 11   . rOPINIRole (REQUIP) 0.25 MG tablet Take 0.25 mg by mouth at bedtime.     . sevelamer carbonate (RENVELA) 800 MG tablet 2 Tablet(s) By Mouth 3 Times Daily     . triamcinolone cream (KENALOG) 0.1 % Apply topically.     Marland Kitchen aspirin EC 81 MG tablet Take 1 tablet (81 mg total) by mouth daily.     . insulin aspart (NOVOLOG) 100 UNIT/ML injection Inject 0-6 Units into the skin See admin instructions. Inject under the skin according to blood glucose reading:  <201: 0u 201- 250: 2u 251-300: 4u 301-350: 5u 351-400: 6u     . insulin glargine (LANTUS) 100 UNIT/ML injection Inject 5 Units into the skin daily in the afternoon.      . meclizine (ANTIVERT) 25 MG tablet Take 25 mg by mouth every 4 (four) hours as needed for dizziness.     . Omega-3 1000  MG CAPS Take 2 capsules by mouth daily.      . Polyvinyl Alcohol-Povidone PF (REFRESH) 1.4-0.6 % SOLN Place 1 drop into both eyes 2 (two) times a day.      . timolol (TIMOPTIC) 0.5 % ophthalmic solution Place 1 drop into both eyes 3 (three) times daily. 10 mL 12   . traMADol (ULTRAM) 50 MG tablet Take 1 tablet (50 mg total) by mouth every 12 (  twelve) hours as needed for severe pain. 10 tablet 0   . vitamin B-12 (CYANOCOBALAMIN) 1000 MCG tablet Take 1 tablet (1,000 mcg total) by mouth daily. 30 tablet 0    Social History   Socioeconomic History  . Marital status: Single    Spouse name: Not on file  . Number of children: Not on file  . Years of education: Not on file  . Highest education level: Not on file  Occupational History  . Not on file  Tobacco Use  . Smoking status: Never Smoker  . Smokeless tobacco: Never Used  Vaping Use  . Vaping Use: Never used  Substance and Sexual Activity  . Alcohol use: No  . Drug use: No  . Sexual activity: Never  Other Topics Concern  . Not on file  Social History Narrative   Lives in [place] with family.   Social Determinants of Health   Financial Resource Strain:   . Difficulty of Paying Living Expenses:   Food Insecurity:   . Worried About Charity fundraiser in the Last Year:   . Arboriculturist in the Last Year:   Transportation Needs:   . Film/video editor (Medical):   Marland Kitchen Lack of Transportation (Non-Medical):   Physical Activity:   . Days of Exercise per Week:   . Minutes of Exercise per Session:   Stress:   . Feeling of Stress :   Social Connections:   . Frequency of Communication with Friends and Family:   . Frequency of Social Gatherings with Friends and Family:   . Attends Religious Services:   . Active Member of Clubs or Organizations:   . Attends Archivist Meetings:   Marland Kitchen Marital Status:   Intimate Partner Violence:   . Fear of Current or Ex-Partner:   . Emotionally Abused:   Marland Kitchen Physically Abused:   .  Sexually Abused:     Family History  Problem Relation Age of Onset  . Diabetes Mellitus II Mother   . Heart failure Mother   . Pancreatic cancer Father       Review of systems complete and found to be negative unless listed above      PHYSICAL EXAM  General: Well developed, well nourished, in no acute distress HEENT:  Normocephalic and atramatic Neck:  No JVD.  Lungs: Clear bilaterally to auscultation and percussion. Heart: HRRR . Normal S1 and S2 without gallops or murmurs.  Abdomen: Bowel sounds are positive, abdomen soft and non-tender  Msk:  Back normal, normal gait. Normal strength and tone for age. Extremities: No clubbing, cyanosis or edema.   Neuro: Alert and oriented X 3. Psych:  Good affect, responds appropriately  Labs:   Lab Results  Component Value Date   WBC 3.8 (L) 11/04/2019   HGB 9.0 (L) 11/04/2019   HCT 29.6 (L) 11/04/2019   MCV 101.7 (H) 11/04/2019   PLT 155 11/04/2019    Recent Labs  Lab 11/04/19 0330  NA 136  K 3.9  CL 96*  CO2 28  BUN 19  CREATININE 4.02*  CALCIUM 8.6*  GLUCOSE 147*   Lab Results  Component Value Date   TROPONINI 0.09 (HH) 07/10/2018    Lab Results  Component Value Date   CHOL 153 07/08/2018   Lab Results  Component Value Date   HDL 35 (L) 07/08/2018   Lab Results  Component Value Date   LDLCALC 55 07/08/2018   Lab Results  Component Value Date   TRIG  313 (H) 07/08/2018   Lab Results  Component Value Date   CHOLHDL 4.4 07/08/2018   No results found for: LDLDIRECT    Radiology: DG Chest 2 View  Result Date: 11/03/2019 CLINICAL DATA:  Shortness of breath. EXAM: CHEST - 2 VIEW COMPARISON:  CT chest and chest x-ray dated Sep 02, 2019. FINDINGS: Unchanged tunneled left internal jugular dialysis catheter with tip in the right atrium. Stable cardiomegaly status post CABG. Similar appearing mild diffuse interstitial thickening and small bilateral pleural effusions with bibasilar atelectasis. No pneumothorax.  No acute osseous abnormality. IMPRESSION: 1. Unchanged mild congestive heart failure. Electronically Signed   By: Titus Dubin M.D.   On: 11/03/2019 12:32    EKG: Sinus rhythm with right bundle branch block and associated ST-T wave abnormalities  ASSESSMENT AND PLAN:   1.  Chest pain.  Patient presents with chief complaint of shortness of breath, with history of COPD on supplemental oxygen, but denies substernal chest pain.  ECG reveals sinus rhythm, with right bundle branch block and associated ST-T wave abnormalities, with prior ECG revealing nonspecific intraventricular conduction delay with lateral T wave inversions.  High-sensitivity troponin borderline elevated, 58 and 58, without significant delta, likely demand supply ischemia, not due to acute coronary syndrome. 2.  Borderline elevated high-sensitivity troponin (58, 58), likely demand supply ischemia 3.  Shortness of breath, secondary to COPD, on supplemental O2 4.  End-stage renal disease on chronic hemodialysis 5.  Combined chronic systolic and diastolic congestive heart failure, patient appears euvolemic 6.  Essential hypertension, systolic blood pressure markedly elevated, improved after nicardipine infusion  Recommendations  1.  Agree with overall current therapy 2.  DC heparin infusion 3.  Uptitrate isosorbide mononitrate to 60 mg daily 4.  Defer cardiac catheterization at this time 5.  Transferred to telemetry when bed available  Signed: Isaias Cowman MD,PhD, [facility] 11/04/2019, 8:43 AM

## 2019-11-05 DIAGNOSIS — J9611 Chronic respiratory failure with hypoxia: Secondary | ICD-10-CM | POA: Diagnosis not present

## 2019-11-05 DIAGNOSIS — R079 Chest pain, unspecified: Secondary | ICD-10-CM | POA: Diagnosis not present

## 2019-11-05 DIAGNOSIS — N186 End stage renal disease: Secondary | ICD-10-CM | POA: Diagnosis not present

## 2019-11-05 DIAGNOSIS — I2510 Atherosclerotic heart disease of native coronary artery without angina pectoris: Secondary | ICD-10-CM | POA: Diagnosis not present

## 2019-11-05 LAB — MAGNESIUM: Magnesium: 1.9 mg/dL (ref 1.7–2.4)

## 2019-11-05 LAB — BASIC METABOLIC PANEL
Anion gap: 8 (ref 5–15)
BUN: 39 mg/dL — ABNORMAL HIGH (ref 8–23)
CO2: 30 mmol/L (ref 22–32)
Calcium: 8.4 mg/dL — ABNORMAL LOW (ref 8.9–10.3)
Chloride: 96 mmol/L — ABNORMAL LOW (ref 98–111)
Creatinine, Ser: 5.97 mg/dL — ABNORMAL HIGH (ref 0.44–1.00)
GFR calc Af Amer: 8 mL/min — ABNORMAL LOW (ref >60)
GFR calc non Af Amer: 7 mL/min — ABNORMAL LOW (ref >60)
Glucose, Bld: 156 mg/dL — ABNORMAL HIGH (ref 70–99)
Potassium: 4.7 mmol/L (ref 3.5–5.1)
Sodium: 134 mmol/L — ABNORMAL LOW (ref 135–145)

## 2019-11-05 LAB — HEPATITIS B SURFACE ANTIGEN: Hepatitis B Surface Ag: NONREACTIVE

## 2019-11-05 LAB — GLUCOSE, CAPILLARY
Glucose-Capillary: 117 mg/dL — ABNORMAL HIGH (ref 70–99)
Glucose-Capillary: 111 mg/dL — ABNORMAL HIGH (ref 70–99)

## 2019-11-05 MED ORDER — EPOETIN ALFA 4000 UNIT/ML IJ SOLN
4000.0000 [IU] | INTRAMUSCULAR | Status: DC
  Administered 2019-11-05: 4000 [IU] via INTRAVENOUS
  Filled 2019-11-05: qty 1

## 2019-11-05 MED ORDER — BRIMONIDINE TARTRATE 0.2 % OP SOLN: 1.0000 [drp] | Freq: Three times a day (TID) | OPHTHALMIC | 12 refills | Status: AC

## 2019-11-05 MED ORDER — HYDRALAZINE HCL 50 MG PO TABS: 50.0000 mg | ORAL_TABLET | Freq: Three times a day (TID) | ORAL | 1 refills | Status: DC

## 2019-11-05 MED ORDER — TRAMADOL HCL 50 MG PO TABS
50.0000 mg | ORAL_TABLET | Freq: Two times a day (BID) | ORAL | Status: DC | PRN
  Administered 2019-11-05: 50 mg via ORAL
  Filled 2019-11-05: qty 1

## 2019-11-05 NOTE — Progress Notes (Signed)
HD assessment.

## 2019-11-05 NOTE — Progress Notes (Signed)
Report given to dustin (nurse) receiving patient at [facility]. No questions at this time. Patient will be transported by EMS back to facility.

## 2019-11-05 NOTE — Progress Notes (Signed)
[facility]  ROUNDING NOTE   Subjective:  Patient seen and evaluated during dialysis treatment. Tolerating well. Resting comfortably.   Objective:  Vital signs in last 24 hours:  Temp:  [97.6 F (36.4 C)-98.6 F (37 C)] 97.9 F (36.6 C) (07/16 0955) Pulse Rate:  [53-63] 55 (07/16 1100) Resp:  [8-21] 10 (07/16 1100) BP: (113-181)/(53-110) 181/57 (07/16 1100) SpO2:  [96 %-100 %] 100 % (07/16 0955) FiO2 (%):  [2 %] 2 % (07/16 0900) Weight:  [56.5 kg] 56.5 kg (07/15 1723)  Weight change: 2.041 kg Filed Weights   11/03/19 1059 11/04/19 1723  Weight: 54.4 kg 56.5 kg    Intake/Output: I/O last 3 completed shifts: In: 916.5 [P.O.:480; I.V.:436.5] Out: -    Intake/Output this shift:  No intake/output data recorded.  Physical Exam: General: No acute distress  Head: Normocephalic, atraumatic. Moist oral mucosal membranes  Eyes: Anicteric  Neck: Supple, trachea midline  Lungs:  Clear to auscultation, normal effort  Heart: S1S2 no rubs  Abdomen:  Soft, nontender, bowel sounds present  Extremities: B/L AKA  Neurologic: Awake, alert, following commands  Skin: No lesions  Access: Permcath    Basic Metabolic Panel: Recent Labs  Lab 11/03/19 1101 11/04/19 0330 11/05/19 0440  NA 135 136 134*  K 4.6 3.9 4.7  CL 92* 96* 96*  CO2 27 28 30   GLUCOSE 144* 147* 156*  BUN 45* 19 39*  CREATININE 7.58* 4.02* 5.97*  CALCIUM 8.7* 8.6* 8.4*  MG  --  1.7 1.9    Liver Function Tests: No results for input(s): AST, ALT, ALKPHOS, BILITOT, PROT, ALBUMIN in the last 168 hours. No results for input(s): LIPASE, AMYLASE in the last 168 hours. No results for input(s): AMMONIA in the last 168 hours.  CBC: Recent Labs  Lab 11/03/19 1101 11/04/19 0330  WBC 5.5 3.8*  HGB 9.0* 9.0*  HCT 29.4* 29.6*  MCV 101.7* 101.7*  PLT 157 155    Cardiac Enzymes: No results for input(s): CKTOTAL, CKMB, CKMBINDEX, TROPONINI in the last 168 hours.  BNP: Invalid input(s): POCBNP   CBG: Recent Labs  Lab 11/04/19 0723 11/04/19 1117 11/04/19 1530 11/04/19 2109 11/05/19 0743  GLUCAP 112* 93 147* 228* 117*    Microbiology: Results for orders placed or performed during the hospital encounter of 11/03/19  SARS Coronavirus 2 by RT PCR (hospital order, performed in [facility] hospital lab) Nasopharyngeal Nasopharyngeal Swab     Status: None   Collection Time: 11/03/19  1:45 PM   Specimen: Nasopharyngeal Swab  Result Value Ref Range Status   SARS Coronavirus 2 NEGATIVE NEGATIVE Final    Comment: (NOTE) SARS-CoV-2 target nucleic acids are NOT DETECTED.  The SARS-CoV-2 RNA is generally detectable in upper and lower respiratory specimens during the acute phase of infection. The lowest concentration of SARS-CoV-2 viral copies this assay can detect is 250 copies / mL. A negative result does not preclude SARS-CoV-2 infection and should not be used as the sole basis for treatment or other patient management decisions.  A negative result may occur with improper specimen collection / handling, submission of specimen other than nasopharyngeal swab, presence of viral mutation(s) within the areas targeted by this assay, and inadequate number of viral copies (<250 copies / mL). A negative result must be combined with clinical observations, patient history, and epidemiological information.  Fact Sheet for Patients:   StrictlyIdeas.no  Fact Sheet for Healthcare Providers: BankingDealers.co.za  This test is not yet approved or  cleared by the [place]  FDA and has been authorized for detection and/or diagnosis of SARS-CoV-2 by FDA under an Emergency Use Authorization (EUA).  This EUA will remain in effect (meaning this test can be used) for the duration of the COVID-19 declaration under Section 564(b)(1) of the Act, 21 U.S.C. section 360bbb-3(b)(1), unless the authorization is terminated or revoked sooner.  Performed  at [facility], [address] [place], [place] [address]   MRSA PCR Screening     Status: None   Collection Time: 11/03/19 10:45 PM   Specimen: Nasopharyngeal  Result Value Ref Range Status   MRSA by PCR NEGATIVE NEGATIVE Final    Comment:        The GeneXpert MRSA Assay (FDA approved for NASAL specimens only), is one component of a comprehensive MRSA colonization surveillance program. It is not intended to diagnose MRSA infection nor to guide or monitor treatment for MRSA infections. Performed at [facility], [address] [place], [place] [address]     Coagulation Studies: Recent Labs    11/03/19 1356  LABPROT 18.3*  INR 1.6*    Urinalysis: No results for input(s): COLORURINE, LABSPEC, PHURINE, GLUCOSEU, HGBUR, BILIRUBINUR, KETONESUR, PROTEINUR, UROBILINOGEN, NITRITE, LEUKOCYTESUR in the last 72 hours.  Invalid input(s): APPERANCEUR    Imaging: DG Chest 2 View  Result Date: 11/03/2019 CLINICAL DATA:  Shortness of breath. EXAM: CHEST - 2 VIEW COMPARISON:  CT chest and chest x-ray dated Sep 02, 2019. FINDINGS: Unchanged tunneled left internal jugular dialysis catheter with tip in the right atrium. Stable cardiomegaly status post CABG. Similar appearing mild diffuse interstitial thickening and small bilateral pleural effusions with bibasilar atelectasis. No pneumothorax. No acute osseous abnormality. IMPRESSION: 1. Unchanged mild congestive heart failure. Electronically Signed   By: Titus Dubin M.D.   On: 11/03/2019 12:32     Medications:    . apixaban  2.5 mg Oral BID  . aspirin EC  81 mg Oral Daily  . atorvastatin  80 mg Oral QHS  . brimonidine  1 drop Both Eyes TID  . carvedilol  25 mg Oral BID WC  . Chlorhexidine Gluconate Cloth  6 each Topical Q0600  . fluticasone  2 spray Each Nare Daily  . hydrALAZINE  50 mg Oral Q8H  . insulin aspart  0-6 Units Subcutaneous TID AC & HS  . insulin glargine  5 Units Subcutaneous QHS  .  ipratropium  2 spray Each Nare BID  . isosorbide mononitrate  60 mg Oral Daily  . levETIRAcetam  250 mg Oral Q T,Th,Sa-HD  . mirtazapine  7.5 mg Oral Daily  . pantoprazole  40 mg Oral Daily  . polyvinyl alcohol  1 drop Both Eyes BID  . rOPINIRole  0.25 mg Oral QHS  . sevelamer carbonate  800 mg Oral TID WC  . timolol  1 drop Both Eyes TID  . vitamin B-12  1,000 mcg Oral Daily   acetaminophen, bisacodyl, cloNIDine, diphenhydrAMINE, hydrALAZINE, meclizine, nitroGLYCERIN, ondansetron (ZOFRAN) IV, polyethylene glycol, traMADol  Assessment/ Plan:  69 y.o. female with end stage renal disease on hemodialysis, hypertension, peripheral vascular disease, bilateral below the knee amputation, coronary disease status post CABG, diabetes mellitus type 2, history of endocarditis, congestive heart failure admitted with  Hypertensive urgency [I16.0] Chest pain [R07.9] Chest pain, unspecified type [R07.9]   [facility] nephrology MWF [facility] left IJ PermCath   1.  ESRD on HD MWF.  Patient seen and evaluated during dialysis treatment today.  Tolerating quite well.  We plan to complete  dialysis treatment today.  2.  Anemia of chronic kidney disease. Lab Results  Component Value Date   HGB 9.0 (L) 11/04/2019  Administer Epogen 4000 IV with dialysis today.  3.  Secondary hyperparathyroidism.  Continue Renvela and continue to monitor bone mineral metabolism parameters.  4.  Hypertension.  Continue hydralazine and carvedilol.    LOS: 1 Munsoor Lateef 7/16/202111:10 AM

## 2019-11-05 NOTE — Discharge Summary (Signed)
Physician Discharge Summary  Stephanie Ellis SVX:[id number] DOB: Nov 30, 1950 DOA: 11/03/2019  PCP: Kirk Ruths, MD  Admit date: 11/03/2019 Discharge date: 11/05/2019  Admitted From: home Disposition:  home  Recommendations for Outpatient Follow-up:  1. Follow up with PCP in 1-2 weeks 2. Please obtain BMP/CBC in one week 3. Please follow up with nephrology and attend dialysis every M/W/F   Home Health: No  Equipment/Devices: None   Discharge Condition: Stable  CODE STATUS: Full  Diet recommendation:  Heart healthy / carb modified / fluid restriction  Discharge Diagnoses: Principal Problem:   Chest pain Active Problems:   Elevated troponin   ESRD (end stage renal disease) on dialysis ([place])   Hypertensive urgency   Coronary artery disease involving left main coronary artery   S/P CABG x 3   Hypertension   Type 2 diabetes mellitus with hyperlipidemia (HCC)   Hyperlipidemia   Peripheral vascular disease ([place])   Status post below-knee amputation of both lower extremities (HCC)   Seizure disorder ([place])   Chronic respiratory failure with hypoxia ([place])    Summary of HPI and Hospital Course:  Stephanie Ellis is a 69 y.o. female with medical history significant of ESRD on dialysis MWF, CAD status post three-vessel CABG, chronic hypoxic respiratory failure on 2 L/min supplemental oxygen, type 2 diabetes, combined systolic/diastolic CHF, peripheral vascular disease, hypertension, hyperlipidemia presented to the ED on 11/03/19  with complaints of headache, fatigue and shortness of breath in the setting of chest pressure/heaviness.  Was unable to attend routine dialysis day of admission due to feeling unwell.  In the ED, afebrile, initial BP 179/75, later BP was over 233 systolic.  Oxygen saturation 97% on chronic 2 L/min oxygen.  Labs notable for stable chronic anemia, end-stage renal disease and troponin mildly elevated at 58.  Chest x-ray showed mild CHF with no changes from prior  study.  EKG was concerning for inverted T waves in the inferolateral leads.  Cardiology was consulted and recommended heparin infusion and admission for further evaluation of chest pain.   Elevated Troponin / Type 2 NSTEMI - demand ischemia. POA. Chest pain- Resolved.  Present on admission, described as heaviness, in setting of uncontrolled BP and elevated troponin and T wave inversions of the inferolateral leads on ECG.  Cardiology was consulted and felt EKG changes were consistent with HTN urgency.  No recurrence of chest pain during admission.  Troponin elevation likely due to demand ischemia given mild elevation and no significant delta seen in trend.  No further cardiac diagnostics recommended.  Heparin stopped on day 2.  Imdur was increased.     ESRD on dialysisMWF-nephrology was consulted and patient received routine dialysis during admission.    Hypertensive urgency- present on admission, systolic BPs over 007 in the ED,and remained elevated despite being given her home blood pressure medications.Startedon nitro drip, later Cardene drip which was stopped 7/15.  Continue home meds as below.  Added hydralazine 50 mg TID with improvement.  BP's somewhat labile and resistant.  CAD s/p 3 vesselCABG-presented with chest pain. Evaluation as above. Continue aspirin, Coreg and Lipitor.  Hypertension-uncontrolled on admission as above.Continue home meds Coreg, clonidine.  Added hydralazine 50 mg TID.  Type 2 diabetes-continue home Lantus 5 units, sliding scale NovoLog.  Anemia of chronic kidney disease - stable, no signs of bleeding. Monitor CBC.    Hyperlipidemia-continue Lipitor  Peripheral vascular disease- s/p bilateral BKA's. Continue aspirin and statin.  Seizure disorder-continue Keppra  Chronic respiratory failure with hypoxia- stable on 2  L/min supplemental oxygen. Monitor.  Obesity: Body mass index is 31.2 kg/m.  Complicates overall care and  prognosis.   Discharge Instructions   Discharge Instructions    (HEART FAILURE PATIENTS) Call MD:  Anytime you have any of the following symptoms: 1) 3 pound weight gain in 24 hours or 5 pounds in 1 week 2) shortness of breath, with or without a dry hacking cough 3) swelling in the hands, feet or stomach 4) if you have to sleep on extra pillows at night in order to breathe.   Complete by: As directed    Call MD for:  extreme fatigue   Complete by: As directed    Call MD for:  persistant dizziness or light-headedness   Complete by: As directed    Call MD for:  severe uncontrolled pain   Complete by: As directed    Call MD for:  temperature >100.4   Complete by: As directed    Diet - low sodium heart healthy   Complete by: As directed    Increase activity slowly   Complete by: As directed      Allergies as of 11/05/2019      Reactions   Chlorthalidone Anaphylaxis, Itching, Rash   Fentanyl Rash   Midazolam Rash   Ace Inhibitors Other (See Comments)   Reaction:  Hyperkalemia, agitation    Angiotensin Receptor Blockers Other (See Comments)   Reaction:  Hyperkalemia, agitation    Norvasc [amlodipine] Itching, Rash   Phenergan [promethazine Hcl] Anxiety   "antsy, can't sit still"      Medication List    TAKE these medications   apixaban 2.5 MG Tabs tablet Commonly known as: ELIQUIS Take 1 tablet (2.5 mg total) by mouth 2 (two) times daily.   aspirin EC 81 MG tablet Take 1 tablet (81 mg total) by mouth daily.   atorvastatin 80 MG tablet Commonly known as: LIPITOR Take 80 mg by mouth at bedtime.   brimonidine 0.2 % ophthalmic solution Commonly known as: ALPHAGAN Place 1 drop into both eyes 3 (three) times daily.   carvedilol 25 MG tablet Commonly known as: COREG Take 1 tablet (25 mg total) by mouth 2 (two) times daily.   cloNIDine 0.1 MG tablet Commonly known as: CATAPRES Take 0.1 mg by mouth daily as needed (BP >160/80).   fluticasone 50 MCG/ACT nasal  spray Commonly known as: FLONASE Place 2 sprays into both nostrils daily.   hydrALAZINE 50 MG tablet Commonly known as: APRESOLINE Take 1 tablet (50 mg total) by mouth every 8 (eight) hours.   insulin aspart 100 UNIT/ML injection Commonly known as: novoLOG Inject 0-6 Units into the skin See admin instructions. Inject under the skin according to blood glucose reading:  <201: 0u 201- 250: 2u 251-300: 4u 301-350: 5u 351-400: 6u   insulin glargine 100 UNIT/ML injection Commonly known as: LANTUS Inject 5 Units into the skin daily in the afternoon.   ipratropium 0.06 % nasal spray Commonly known as: ATROVENT Place 2 sprays into both nostrils 2 (two) times daily.   isosorbide mononitrate 30 MG 24 hr tablet Commonly known as: IMDUR Take 30 mg by mouth daily.   levETIRAcetam 250 MG tablet Commonly known as: KEPPRA Three times weekly after dialysis What changed:   how much to take  how to take this  when to take this  additional instructions  Another medication with the same name was removed. Continue taking this medication, and follow the directions you see here.   meclizine 25 MG  tablet Commonly known as: ANTIVERT Take 25 mg by mouth every 4 (four) hours as needed for dizziness.   mirtazapine 15 MG tablet Commonly known as: REMERON Take 7.5 mg by mouth daily.   Omega-3 1000 MG Caps Take 2 capsules by mouth daily.   pantoprazole 40 MG tablet Commonly known as: PROTONIX Take 1 tablet (40 mg total) by mouth daily.   Refresh 1.4-0.6 % Soln Generic drug: Polyvinyl Alcohol-Povidone PF Place 1 drop into both eyes 2 (two) times a day.   rOPINIRole 0.25 MG tablet Commonly known as: REQUIP Take 0.25 mg by mouth at bedtime.   sevelamer carbonate 800 MG tablet Commonly known as: RENVELA 2 Tablet(s) By Mouth 3 Times Daily   timolol 0.5 % ophthalmic solution Commonly known as: TIMOPTIC Place 1 drop into both eyes 3 (three) times daily.   traMADol 50 MG  tablet Commonly known as: ULTRAM Take 1 tablet (50 mg total) by mouth every 12 (twelve) hours as needed for severe pain.   triamcinolone cream 0.1 % Commonly known as: KENALOG Apply topically.   vitamin B-12 1000 MCG tablet Commonly known as: CYANOCOBALAMIN Take 1 tablet (1,000 mcg total) by mouth daily.       Allergies  Allergen Reactions  . Chlorthalidone Anaphylaxis, Itching and Rash  . Fentanyl Rash  . Midazolam Rash  . Ace Inhibitors Other (See Comments)    Reaction:  Hyperkalemia, agitation   . Angiotensin Receptor Blockers Other (See Comments)    Reaction:  Hyperkalemia, agitation   . Norvasc [Amlodipine] Itching and Rash  . Phenergan [Promethazine Hcl] Anxiety    "antsy, can't sit still"    Consultations:  Cardiology  Nephrology    Procedures/Studies: DG Chest 2 View  Result Date: 11/03/2019 CLINICAL DATA:  Shortness of breath. EXAM: CHEST - 2 VIEW COMPARISON:  CT chest and chest x-ray dated Sep 02, 2019. FINDINGS: Unchanged tunneled left internal jugular dialysis catheter with tip in the right atrium. Stable cardiomegaly status post CABG. Similar appearing mild diffuse interstitial thickening and small bilateral pleural effusions with bibasilar atelectasis. No pneumothorax. No acute osseous abnormality. IMPRESSION: 1. Unchanged mild congestive heart failure. Electronically Signed   By: Titus Dubin M.D.   On: 11/03/2019 12:32          Subjective: Patient seen during dialysis today.  Reports she feels a lot better.  Still short of breath at times but reports this is her baseline.  No fevers or chills or other acute complaints.  Agreeable to d/c home after dialysis later today.   Discharge Exam: Vitals:   11/05/19 1245 11/05/19 1300  BP: (!) 127/107 (!) 167/68  Pulse: (!) 57 (!) 55  Resp: 13 18  Temp:    SpO2:     Vitals:   11/05/19 1215 11/05/19 1230 11/05/19 1245 11/05/19 1300  BP: (!) 166/87 (!) 170/68 (!) 127/107 (!) 167/68  Pulse: (!) 57  (!) 56 (!) 57 (!) 55  Resp: 12 14 13 18   Temp:      TempSrc:      SpO2:      Weight:      Height:        General: Pt is alert, awake, not in acute distress Cardiovascular: RRR, S1/S2 +, no rubs, no gallops, anterior left upper chest with Perm-cath in place undergoing dialysis Respiratory: CTA bilaterally, no wheezing, no rhonchi Abdominal: Soft, NT, ND, bowel sounds + Extremities: no edema, no cyanosis    The results of significant diagnostics from this hospitalization (including  imaging, microbiology, ancillary and laboratory) are listed below for reference.     Microbiology: Recent Results (from the past 240 hour(s))  SARS Coronavirus 2 by RT PCR (hospital order, performed in [facility] hospital lab) Nasopharyngeal Nasopharyngeal Swab     Status: None   Collection Time: 11/03/19  1:45 PM   Specimen: Nasopharyngeal Swab  Result Value Ref Range Status   SARS Coronavirus 2 NEGATIVE NEGATIVE Final    Comment: (NOTE) SARS-CoV-2 target nucleic acids are NOT DETECTED.  The SARS-CoV-2 RNA is generally detectable in upper and lower respiratory specimens during the acute phase of infection. The lowest concentration of SARS-CoV-2 viral copies this assay can detect is 250 copies / mL. A negative result does not preclude SARS-CoV-2 infection and should not be used as the sole basis for treatment or other patient management decisions.  A negative result may occur with improper specimen collection / handling, submission of specimen other than nasopharyngeal swab, presence of viral mutation(s) within the areas targeted by this assay, and inadequate number of viral copies (<250 copies / mL). A negative result must be combined with clinical observations, patient history, and epidemiological information.  Fact Sheet for Patients:   StrictlyIdeas.no  Fact Sheet for Healthcare Providers: BankingDealers.co.za  This test is not yet approved or   cleared by the [place] FDA and has been authorized for detection and/or diagnosis of SARS-CoV-2 by FDA under an Emergency Use Authorization (EUA).  This EUA will remain in effect (meaning this test can be used) for the duration of the COVID-19 declaration under Section 564(b)(1) of the Act, 21 U.S.C. section 360bbb-3(b)(1), unless the authorization is terminated or revoked sooner.  Performed at [facility], [address] [place], [place] [address]   MRSA PCR Screening     Status: None   Collection Time: 11/03/19 10:45 PM   Specimen: Nasopharyngeal  Result Value Ref Range Status   MRSA by PCR NEGATIVE NEGATIVE Final    Comment:        The GeneXpert MRSA Assay (FDA approved for NASAL specimens only), is one component of a comprehensive MRSA colonization surveillance program. It is not intended to diagnose MRSA infection nor to guide or monitor treatment for MRSA infections. Performed at [facility], [address] [place], [place] [address]      Labs: BNP (last 3 results) Recent Labs    09/02/19 1555  BNP >1,856.3*   Basic Metabolic Panel: Recent Labs  Lab 11/03/19 1101 11/04/19 0330 11/05/19 0440  NA 135 136 134*  K 4.6 3.9 4.7  CL 92* 96* 96*  CO2 27 28 30   GLUCOSE 144* 147* 156*  BUN 45* 19 39*  CREATININE 7.58* 4.02* 5.97*  CALCIUM 8.7* 8.6* 8.4*  MG  --  1.7 1.9   Liver Function Tests: No results for input(s): AST, ALT, ALKPHOS, BILITOT, PROT, ALBUMIN in the last 168 hours. No results for input(s): LIPASE, AMYLASE in the last 168 hours. No results for input(s): AMMONIA in the last 168 hours. CBC: Recent Labs  Lab 11/03/19 1101 11/04/19 0330  WBC 5.5 3.8*  HGB 9.0* 9.0*  HCT 29.4* 29.6*  MCV 101.7* 101.7*  PLT 157 155   Cardiac Enzymes: No results for input(s): CKTOTAL, CKMB, CKMBINDEX, TROPONINI in the last 168 hours. BNP: Invalid input(s): POCBNP CBG: Recent Labs  Lab 11/04/19 0723 11/04/19 1117  11/04/19 1530 11/04/19 2109 11/05/19 0743  GLUCAP 112* 93 147* 228* 117*   D-Dimer No results for input(s): DDIMER in the  last 72 hours. Hgb A1c Recent Labs    11/03/19 1101  HGBA1C 7.0*   Lipid Profile No results for input(s): CHOL, HDL, LDLCALC, TRIG, CHOLHDL, LDLDIRECT in the last 72 hours. Thyroid function studies No results for input(s): TSH, T4TOTAL, T3FREE, THYROIDAB in the last 72 hours.  Invalid input(s): FREET3 Anemia work up No results for input(s): VITAMINB12, FOLATE, FERRITIN, TIBC, IRON, RETICCTPCT in the last 72 hours. Urinalysis    Component Value Date/Time   COLORURINE RED (A) 10/17/2016 1643   APPEARANCEUR TURBID (A) 10/17/2016 1643   LABSPEC 1.014 10/17/2016 1643   PHURINE  10/17/2016 1643    TEST NOT REPORTED DUE TO COLOR INTERFERENCE OF URINE PIGMENT   GLUCOSEU (A) 10/17/2016 1643    TEST NOT REPORTED DUE TO COLOR INTERFERENCE OF URINE PIGMENT   HGBUR (A) 10/17/2016 1643    TEST NOT REPORTED DUE TO COLOR INTERFERENCE OF URINE PIGMENT   BILIRUBINUR (A) 10/17/2016 1643    TEST NOT REPORTED DUE TO COLOR INTERFERENCE OF URINE PIGMENT   KETONESUR (A) 10/17/2016 1643    TEST NOT REPORTED DUE TO COLOR INTERFERENCE OF URINE PIGMENT   PROTEINUR (A) 10/17/2016 1643    TEST NOT REPORTED DUE TO COLOR INTERFERENCE OF URINE PIGMENT   NITRITE (A) 10/17/2016 1643    TEST NOT REPORTED DUE TO COLOR INTERFERENCE OF URINE PIGMENT   LEUKOCYTESUR (A) 10/17/2016 1643    TEST NOT REPORTED DUE TO COLOR INTERFERENCE OF URINE PIGMENT   Sepsis Labs Invalid input(s): PROCALCITONIN,  WBC,  LACTICIDVEN Microbiology Recent Results (from the past 240 hour(s))  SARS Coronavirus 2 by RT PCR (hospital order, performed in [place] hospital lab) Nasopharyngeal Nasopharyngeal Swab     Status: None   Collection Time: 11/03/19  1:45 PM   Specimen: Nasopharyngeal Swab  Result Value Ref Range Status   SARS Coronavirus 2 NEGATIVE NEGATIVE Final    Comment: (NOTE) SARS-CoV-2 target  nucleic acids are NOT DETECTED.  The SARS-CoV-2 RNA is generally detectable in upper and lower respiratory specimens during the acute phase of infection. The lowest concentration of SARS-CoV-2 viral copies this assay can detect is 250 copies / mL. A negative result does not preclude SARS-CoV-2 infection and should not be used as the sole basis for treatment or other patient management decisions.  A negative result may occur with improper specimen collection / handling, submission of specimen other than nasopharyngeal swab, presence of viral mutation(s) within the areas targeted by this assay, and inadequate number of viral copies (<250 copies / mL). A negative result must be combined with clinical observations, patient history, and epidemiological information.  Fact Sheet for Patients:   StrictlyIdeas.no  Fact Sheet for Healthcare Providers: BankingDealers.co.za  This test is not yet approved or  cleared by the [place] FDA and has been authorized for detection and/or diagnosis of SARS-CoV-2 by FDA under an Emergency Use Authorization (EUA).  This EUA will remain in effect (meaning this test can be used) for the duration of the COVID-19 declaration under Section 564(b)(1) of the Act, 21 U.S.C. section 360bbb-3(b)(1), unless the authorization is terminated or revoked sooner.  Performed at [facility], [address] [place], [place] [address]   MRSA PCR Screening     Status: None   Collection Time: 11/03/19 10:45 PM   Specimen: Nasopharyngeal  Result Value Ref Range Status   MRSA by PCR NEGATIVE NEGATIVE Final    Comment:        The GeneXpert MRSA Assay (FDA approved for  NASAL specimens only), is one component of a comprehensive MRSA colonization surveillance program. It is not intended to diagnose MRSA infection nor to guide or monitor treatment for MRSA infections. Performed at [facility], [address] [place], [place] [address]      Time coordinating discharge: Over 30 minutes  SIGNED:   Ezekiel Slocumb, DO [facility] 11/05/2019, 1:07 PM   If 7PM-7AM, please contact night-coverage [email or web address]

## 2019-11-05 NOTE — Progress Notes (Signed)
HD started.

## 2019-11-05 NOTE — Progress Notes (Signed)
[facility] Cardiology  SUBJECTIVE: Patient laying in bed, denies chest pain   Vitals:   11/04/19 1937 11/05/19 0448 11/05/19 0518 11/05/19 0744  BP: (!) 178/65 (!) 156/63 (!) 143/59 (!) 148/53  Pulse: 62 60 62 61  Resp: 15 16 16 16   Temp: 98.6 F (37 C) 97.9 F (36.6 C) 98 F (36.7 C) 98.6 F (37 C)  TempSrc: Oral Oral Oral Oral  SpO2: 97% 96% 100% 100%  Weight:      Height:         Intake/Output Summary (Last 24 hours) at 11/05/2019 0825 Last data filed at 11/04/2019 1310 Gross per 24 hour  Intake 535.58 ml  Output --  Net 535.58 ml      PHYSICAL EXAM  General: Well developed, well nourished, in no acute distress HEENT:  Normocephalic and atramatic Neck:  No JVD.  Lungs: Clear bilaterally to auscultation and percussion. Heart: HRRR . Normal S1 and S2 without gallops or murmurs.  Abdomen: Bowel sounds are positive, abdomen soft and non-tender  Msk:  Back normal, normal gait. Normal strength and tone for age. Extremities: No clubbing, cyanosis or edema.   Neuro: Alert and oriented X 3. Psych:  Good affect, responds appropriately   LABS: Basic Metabolic Panel: Recent Labs    11/04/19 0330 11/05/19 0440  NA 136 134*  K 3.9 4.7  CL 96* 96*  CO2 28 30  GLUCOSE 147* 156*  BUN 19 39*  CREATININE 4.02* 5.97*  CALCIUM 8.6* 8.4*  MG 1.7 1.9   Liver Function Tests: No results for input(s): AST, ALT, ALKPHOS, BILITOT, PROT, ALBUMIN in the last 72 hours. No results for input(s): LIPASE, AMYLASE in the last 72 hours. CBC: Recent Labs    11/03/19 1101 11/04/19 0330  WBC 5.5 3.8*  HGB 9.0* 9.0*  HCT 29.4* 29.6*  MCV 101.7* 101.7*  PLT 157 155   Cardiac Enzymes: No results for input(s): CKTOTAL, CKMB, CKMBINDEX, TROPONINI in the last 72 hours. BNP: Invalid input(s): POCBNP D-Dimer: No results for input(s): DDIMER in the last 72 hours. Hemoglobin A1C: Recent Labs    11/03/19 1101  HGBA1C 7.0*   Fasting Lipid Panel: No results for input(s): CHOL, HDL,  LDLCALC, TRIG, CHOLHDL, LDLDIRECT in the last 72 hours. Thyroid Function Tests: No results for input(s): TSH, T4TOTAL, T3FREE, THYROIDAB in the last 72 hours.  Invalid input(s): FREET3 Anemia Panel: No results for input(s): VITAMINB12, FOLATE, FERRITIN, TIBC, IRON, RETICCTPCT in the last 72 hours.  DG Chest 2 View  Result Date: 11/03/2019 CLINICAL DATA:  Shortness of breath. EXAM: CHEST - 2 VIEW COMPARISON:  CT chest and chest x-ray dated Sep 02, 2019. FINDINGS: Unchanged tunneled left internal jugular dialysis catheter with tip in the right atrium. Stable cardiomegaly status post CABG. Similar appearing mild diffuse interstitial thickening and small bilateral pleural effusions with bibasilar atelectasis. No pneumothorax. No acute osseous abnormality. IMPRESSION: 1. Unchanged mild congestive heart failure. Electronically Signed   By: Titus Dubin M.D.   On: 11/03/2019 12:32     Echo LVEF 35 to 40% by 2D echocardiogram 07/09/2018  TELEMETRY: Sinus rhythm:  ASSESSMENT AND PLAN:  Principal Problem:   Chest pain Active Problems:   Elevated troponin   Coronary artery disease involving left main coronary artery   S/P CABG x 3   ESRD (end stage renal disease) on dialysis ([employer])   Hypertension   Hyperlipidemia   Seizure disorder ([place])   Type 2 diabetes mellitus with hyperlipidemia ([place])   Peripheral vascular disease ([place])  Hypertensive urgency   Status post below-knee amputation of both lower extremities (HCC)   Chronic respiratory failure with hypoxia ([place])    1.  Chest pain.  Patient presents with chief complaint of shortness of breath with underlying COPD on supplemental oxygen.  Patient denies chest pain.  ECG revealed right bundle branch block with associated ST-T wave abnormalities with prior ECGs revealing nonspecific intraventricular conduction delay well lateral T wave inversions.  High-sensitivity troponin borderline elevated (58, 58), without significant delta.  Patient  remains chest pain-free. 2.  Borderline elevated high-sensitivity troponin (58, 58), without significant delta, likely demand supply ischemia in patient with end-stage renal disease on chronic hemodialysis 3.  Shortness of breath, with underlying COPD on supplemental oxygen 4.  End-stage renal disease, on chronic hemodialysis 5.  Combined chronic systolic and diastolic congestive heart failure, currently appears euvolemic 6.  Essential hypertension, blood pressure adequately controlled  Recommendations  1.  Agree with current therapy 2.  Defer further cardiac diagnostics at this time   Isaias Cowman, MD, PhD, [facility] 11/05/2019 8:25 AM

## 2019-11-06 LAB — HEPATITIS B SURFACE ANTIBODY, QUANTITATIVE: Hep B S AB Quant (Post): 5.6 m[IU]/mL — ABNORMAL LOW (ref >9.9)

## 2019-12-16 ENCOUNTER — Ambulatory Visit: Payer: Medicare Other | Admitting: Family

## 2019-12-21 ENCOUNTER — Ambulatory Visit: Payer: Medicare Other | Admitting: Family

## 2020-04-02 ENCOUNTER — Encounter: Payer: Self-pay | Admitting: Emergency Medicine

## 2020-04-02 ENCOUNTER — Other Ambulatory Visit: Payer: Self-pay

## 2020-04-02 ENCOUNTER — Emergency Department: Payer: Medicare Other

## 2020-04-02 ENCOUNTER — Inpatient Hospital Stay
Admission: EM | Admit: 2020-04-02 | Discharge: 2020-04-06 | DRG: 296 | Disposition: A | Discharge status: Home / Self Care | Payer: Medicare Other | Attending: Internal Medicine | Admitting: Internal Medicine

## 2020-04-02 DIAGNOSIS — I452 Bifascicular block: Secondary | ICD-10-CM | POA: Diagnosis present

## 2020-04-02 DIAGNOSIS — I248 Other forms of acute ischemic heart disease: Secondary | ICD-10-CM | POA: Diagnosis present

## 2020-04-02 DIAGNOSIS — N2581 Secondary hyperparathyroidism of renal origin: Secondary | ICD-10-CM | POA: Diagnosis present

## 2020-04-02 DIAGNOSIS — I160 Hypertensive urgency: Secondary | ICD-10-CM | POA: Diagnosis present

## 2020-04-02 DIAGNOSIS — R778 Other specified abnormalities of plasma proteins: Secondary | ICD-10-CM

## 2020-04-02 DIAGNOSIS — Z888 Allergy status to other drugs, medicaments and biological substances status: Secondary | ICD-10-CM

## 2020-04-02 DIAGNOSIS — K8080 Other cholelithiasis without obstruction: Secondary | ICD-10-CM

## 2020-04-02 DIAGNOSIS — J9611 Chronic respiratory failure with hypoxia: Secondary | ICD-10-CM | POA: Diagnosis present

## 2020-04-02 DIAGNOSIS — Z951 Presence of aortocoronary bypass graft: Secondary | ICD-10-CM

## 2020-04-02 DIAGNOSIS — Z20822 Contact with and (suspected) exposure to covid-19: Secondary | ICD-10-CM | POA: Diagnosis present

## 2020-04-02 DIAGNOSIS — I5042 Chronic combined systolic (congestive) and diastolic (congestive) heart failure: Secondary | ICD-10-CM | POA: Diagnosis not present

## 2020-04-02 DIAGNOSIS — Z79899 Other long term (current) drug therapy: Secondary | ICD-10-CM

## 2020-04-02 DIAGNOSIS — R404 Transient alteration of awareness: Secondary | ICD-10-CM | POA: Diagnosis present

## 2020-04-02 DIAGNOSIS — B029 Zoster without complications: Secondary | ICD-10-CM | POA: Diagnosis present

## 2020-04-02 DIAGNOSIS — K439 Ventral hernia without obstruction or gangrene: Secondary | ICD-10-CM | POA: Diagnosis present

## 2020-04-02 DIAGNOSIS — M549 Dorsalgia, unspecified: Secondary | ICD-10-CM | POA: Diagnosis present

## 2020-04-02 DIAGNOSIS — I5043 Acute on chronic combined systolic (congestive) and diastolic (congestive) heart failure: Secondary | ICD-10-CM | POA: Diagnosis present

## 2020-04-02 DIAGNOSIS — D696 Thrombocytopenia, unspecified: Secondary | ICD-10-CM | POA: Diagnosis present

## 2020-04-02 DIAGNOSIS — Z794 Long term (current) use of insulin: Secondary | ICD-10-CM

## 2020-04-02 DIAGNOSIS — R079 Chest pain, unspecified: Secondary | ICD-10-CM | POA: Diagnosis present

## 2020-04-02 DIAGNOSIS — K8012 Calculus of gallbladder with acute and chronic cholecystitis without obstruction: Secondary | ICD-10-CM | POA: Diagnosis present

## 2020-04-02 DIAGNOSIS — R001 Bradycardia, unspecified: Secondary | ICD-10-CM | POA: Diagnosis present

## 2020-04-02 DIAGNOSIS — K761 Chronic passive congestion of liver: Secondary | ICD-10-CM | POA: Diagnosis present

## 2020-04-02 DIAGNOSIS — D631 Anemia in chronic kidney disease: Secondary | ICD-10-CM | POA: Diagnosis present

## 2020-04-02 DIAGNOSIS — Z885 Allergy status to narcotic agent status: Secondary | ICD-10-CM | POA: Diagnosis not present

## 2020-04-02 DIAGNOSIS — I25118 Atherosclerotic heart disease of native coronary artery with other forms of angina pectoris: Secondary | ICD-10-CM | POA: Diagnosis present

## 2020-04-02 DIAGNOSIS — I444 Left anterior fascicular block: Secondary | ICD-10-CM | POA: Diagnosis present

## 2020-04-02 DIAGNOSIS — Z89512 Acquired absence of left leg below knee: Secondary | ICD-10-CM | POA: Diagnosis not present

## 2020-04-02 DIAGNOSIS — I469 Cardiac arrest, cause unspecified: Principal | ICD-10-CM | POA: Diagnosis present

## 2020-04-02 DIAGNOSIS — N186 End stage renal disease: Secondary | ICD-10-CM | POA: Diagnosis present

## 2020-04-02 DIAGNOSIS — I25709 Atherosclerosis of coronary artery bypass graft(s), unspecified, with unspecified angina pectoris: Secondary | ICD-10-CM | POA: Diagnosis not present

## 2020-04-02 DIAGNOSIS — G40909 Epilepsy, unspecified, not intractable, without status epilepticus: Secondary | ICD-10-CM | POA: Diagnosis present

## 2020-04-02 DIAGNOSIS — Z833 Family history of diabetes mellitus: Secondary | ICD-10-CM

## 2020-04-02 DIAGNOSIS — E1151 Type 2 diabetes mellitus with diabetic peripheral angiopathy without gangrene: Secondary | ICD-10-CM | POA: Diagnosis present

## 2020-04-02 DIAGNOSIS — Z8249 Family history of ischemic heart disease and other diseases of the circulatory system: Secondary | ICD-10-CM

## 2020-04-02 DIAGNOSIS — Z992 Dependence on renal dialysis: Secondary | ICD-10-CM

## 2020-04-02 DIAGNOSIS — I132 Hypertensive heart and chronic kidney disease with heart failure and with stage 5 chronic kidney disease, or end stage renal disease: Secondary | ICD-10-CM | POA: Diagnosis present

## 2020-04-02 DIAGNOSIS — Z89511 Acquired absence of right leg below knee: Secondary | ICD-10-CM | POA: Diagnosis not present

## 2020-04-02 DIAGNOSIS — R7989 Other specified abnormal findings of blood chemistry: Secondary | ICD-10-CM | POA: Diagnosis present

## 2020-04-02 DIAGNOSIS — E118 Type 2 diabetes mellitus with unspecified complications: Secondary | ICD-10-CM | POA: Diagnosis present

## 2020-04-02 DIAGNOSIS — K810 Acute cholecystitis: Secondary | ICD-10-CM

## 2020-04-02 DIAGNOSIS — E1122 Type 2 diabetes mellitus with diabetic chronic kidney disease: Secondary | ICD-10-CM | POA: Diagnosis present

## 2020-04-02 DIAGNOSIS — Z7901 Long term (current) use of anticoagulants: Secondary | ICD-10-CM

## 2020-04-02 DIAGNOSIS — E785 Hyperlipidemia, unspecified: Secondary | ICD-10-CM | POA: Diagnosis present

## 2020-04-02 DIAGNOSIS — J9811 Atelectasis: Secondary | ICD-10-CM | POA: Diagnosis present

## 2020-04-02 DIAGNOSIS — R569 Unspecified convulsions: Secondary | ICD-10-CM

## 2020-04-02 DIAGNOSIS — Z955 Presence of coronary angioplasty implant and graft: Secondary | ICD-10-CM

## 2020-04-02 DIAGNOSIS — I252 Old myocardial infarction: Secondary | ICD-10-CM

## 2020-04-02 DIAGNOSIS — K529 Noninfective gastroenteritis and colitis, unspecified: Secondary | ICD-10-CM | POA: Diagnosis present

## 2020-04-02 DIAGNOSIS — Z800 Family history of malignant neoplasm of digestive organs: Secondary | ICD-10-CM

## 2020-04-02 DIAGNOSIS — R092 Respiratory arrest: Secondary | ICD-10-CM

## 2020-04-02 DIAGNOSIS — I4510 Unspecified right bundle-branch block: Secondary | ICD-10-CM | POA: Diagnosis present

## 2020-04-02 DIAGNOSIS — K219 Gastro-esophageal reflux disease without esophagitis: Secondary | ICD-10-CM | POA: Diagnosis present

## 2020-04-02 DIAGNOSIS — Z7982 Long term (current) use of aspirin: Secondary | ICD-10-CM

## 2020-04-02 DIAGNOSIS — R1012 Left upper quadrant pain: Secondary | ICD-10-CM | POA: Diagnosis present

## 2020-04-02 DIAGNOSIS — Z8616 Personal history of COVID-19: Secondary | ICD-10-CM

## 2020-04-02 LAB — TROPONIN I (HIGH SENSITIVITY)
Troponin I (High Sensitivity): 80 ng/L — ABNORMAL HIGH (ref <18)
Troponin I (High Sensitivity): 72 ng/L — ABNORMAL HIGH (ref <18)

## 2020-04-02 LAB — CBC
HCT: 42.2 % (ref 36.0–46.0)
Hemoglobin: 12.8 g/dL (ref 12.0–15.0)
MCH: 29.7 pg (ref 26.0–34.0)
MCHC: 30.3 g/dL (ref 30.0–36.0)
MCV: 97.9 fL (ref 80.0–100.0)
Platelets: 109 10*3/uL — ABNORMAL LOW (ref 150–400)
RBC: 4.31 MIL/uL (ref 3.87–5.11)
RDW: 16.5 % — ABNORMAL HIGH (ref 11.5–15.5)
WBC: 4.3 10*3/uL (ref 4.0–10.5)
nRBC: 0 % (ref 0.0–0.2)

## 2020-04-02 LAB — COMPREHENSIVE METABOLIC PANEL
ALT: 24 U/L (ref 0–44)
AST: 19 U/L (ref 15–41)
Albumin: 3.9 g/dL (ref 3.5–5.0)
Alkaline Phosphatase: 89 U/L (ref 38–126)
Anion gap: 15 (ref 5–15)
BUN: 35 mg/dL — ABNORMAL HIGH (ref 8–23)
CO2: 28 mmol/L (ref 22–32)
Calcium: 9.2 mg/dL (ref 8.9–10.3)
Chloride: 92 mmol/L — ABNORMAL LOW (ref 98–111)
Creatinine, Ser: 6.38 mg/dL — ABNORMAL HIGH (ref 0.44–1.00)
GFR, Estimated: 7 mL/min — ABNORMAL LOW (ref >60)
Glucose, Bld: 185 mg/dL — ABNORMAL HIGH (ref 70–99)
Potassium: 4.2 mmol/L (ref 3.5–5.1)
Sodium: 135 mmol/L (ref 135–145)
Total Bilirubin: 0.8 mg/dL (ref 0.3–1.2)
Total Protein: 7.2 g/dL (ref 6.5–8.1)

## 2020-04-02 LAB — RESP PANEL BY RT-PCR (FLU A&B, COVID) ARPGX2
Influenza A by PCR: NEGATIVE
Influenza B by PCR: NEGATIVE
SARS Coronavirus 2 by RT PCR: NEGATIVE

## 2020-04-02 LAB — CBG MONITORING, ED: Glucose-Capillary: 200 mg/dL — ABNORMAL HIGH (ref 70–99)

## 2020-04-02 LAB — LIPASE, BLOOD: Lipase: 39 U/L (ref 11–51)

## 2020-04-02 LAB — LACTIC ACID, PLASMA: Lactic Acid, Venous: 1.1 mmol/L (ref 0.5–1.9)

## 2020-04-02 LAB — HEMOGLOBIN A1C
Hgb A1c MFr Bld: 7.8 % — ABNORMAL HIGH (ref 4.8–5.6)
Mean Plasma Glucose: 177.16 mg/dL

## 2020-04-02 MED ORDER — HYDRALAZINE HCL 50 MG PO TABS
50.0000 mg | ORAL_TABLET | Freq: Three times a day (TID) | ORAL | Status: DC
  Administered 2020-04-02 – 2020-04-06 (×11): 50 mg via ORAL
  Filled 2020-04-02 (×11): qty 1

## 2020-04-02 MED ORDER — SEVELAMER CARBONATE 800 MG PO TABS
1600.0000 mg | ORAL_TABLET | Freq: Three times a day (TID) | ORAL | Status: DC
  Administered 2020-04-03 – 2020-04-06 (×9): 1600 mg via ORAL
  Filled 2020-04-02 (×12): qty 2

## 2020-04-02 MED ORDER — NALOXONE HCL 2 MG/2ML IJ SOSY
2.0000 mg | PREFILLED_SYRINGE | Freq: Once | INTRAMUSCULAR | Status: AC
  Administered 2020-04-02: 2 mg via INTRAVENOUS

## 2020-04-02 MED ORDER — VITAMIN B-12 1000 MCG PO TABS
1000.0000 ug | ORAL_TABLET | Freq: Every day | ORAL | Status: DC
  Administered 2020-04-04 – 2020-04-06 (×3): 1000 ug via ORAL
  Filled 2020-04-02 (×4): qty 1

## 2020-04-02 MED ORDER — NALOXONE HCL 2 MG/2ML IJ SOSY
0.5000 mg | PREFILLED_SYRINGE | Freq: Once | INTRAMUSCULAR | Status: AC
  Administered 2020-04-02: 0.5 mg via INTRAVENOUS

## 2020-04-02 MED ORDER — IOHEXOL 9 MG/ML PO SOLN
500.0000 mL | Freq: Two times a day (BID) | ORAL | Status: DC | PRN
  Filled 2020-04-02: qty 500

## 2020-04-02 MED ORDER — ISOSORBIDE MONONITRATE ER 30 MG PO TB24
30.0000 mg | ORAL_TABLET | Freq: Every day | ORAL | Status: DC
  Administered 2020-04-04 – 2020-04-06 (×2): 30 mg via ORAL
  Filled 2020-04-02 (×2): qty 1

## 2020-04-02 MED ORDER — ROPINIROLE HCL 0.25 MG PO TABS
0.2500 mg | ORAL_TABLET | Freq: Every day | ORAL | Status: DC
  Administered 2020-04-03 – 2020-04-05 (×3): 0.25 mg via ORAL
  Filled 2020-04-02 (×5): qty 1

## 2020-04-02 MED ORDER — NALOXONE HCL 2 MG/2ML IJ SOSY: 1.0000 mg | PREFILLED_SYRINGE | Freq: Once | INTRAMUSCULAR | Status: AC

## 2020-04-02 MED ORDER — CARVEDILOL 25 MG PO TABS
25.0000 mg | ORAL_TABLET | ORAL | Status: AC
  Administered 2020-04-02: 25 mg via ORAL
  Filled 2020-04-02: qty 1

## 2020-04-02 MED ORDER — LEVETIRACETAM 250 MG PO TABS
250.0000 mg | ORAL_TABLET | ORAL | Status: DC
  Administered 2020-04-04 – 2020-04-06 (×2): 250 mg via ORAL
  Filled 2020-04-02 (×3): qty 1

## 2020-04-02 MED ORDER — HYDROMORPHONE HCL 1 MG/ML IJ SOLN
0.5000 mg | INTRAMUSCULAR | Status: AC
  Administered 2020-04-02: 0.5 mg via INTRAVENOUS
  Filled 2020-04-02: qty 1

## 2020-04-02 MED ORDER — SODIUM CHLORIDE 0.9% FLUSH: 3.0000 mL | INTRAVENOUS | Status: DC | PRN

## 2020-04-02 MED ORDER — CARVEDILOL 25 MG PO TABS
25.0000 mg | ORAL_TABLET | Freq: Two times a day (BID) | ORAL | Status: DC
  Administered 2020-04-03 – 2020-04-06 (×4): 25 mg via ORAL
  Filled 2020-04-02 (×3): qty 1
  Filled 2020-04-02: qty 4
  Filled 2020-04-02 (×2): qty 1

## 2020-04-02 MED ORDER — ONDANSETRON HCL 4 MG/2ML IJ SOLN
4.0000 mg | Freq: Four times a day (QID) | INTRAMUSCULAR | Status: DC | PRN
  Administered 2020-04-02: 4 mg via INTRAVENOUS
  Filled 2020-04-02: qty 2

## 2020-04-02 MED ORDER — BRIMONIDINE TARTRATE 0.2 % OP SOLN: 1.0000 [drp] | Freq: Three times a day (TID) | OPHTHALMIC | Status: DC

## 2020-04-02 MED ORDER — SODIUM CHLORIDE 0.9% FLUSH
3.0000 mL | Freq: Two times a day (BID) | INTRAVENOUS | Status: DC
  Administered 2020-04-02 – 2020-04-05 (×6): 3 mL via INTRAVENOUS

## 2020-04-02 MED ORDER — MECLIZINE HCL 25 MG PO TABS
25.0000 mg | ORAL_TABLET | ORAL | Status: DC | PRN
  Administered 2020-04-05: 25 mg via ORAL
  Filled 2020-04-02 (×2): qty 1

## 2020-04-02 MED ORDER — HYDRALAZINE HCL 50 MG PO TABS
50.0000 mg | ORAL_TABLET | Freq: Once | ORAL | Status: AC
  Administered 2020-04-02: 50 mg via ORAL
  Filled 2020-04-02: qty 1

## 2020-04-02 MED ORDER — TRAMADOL HCL 50 MG PO TABS: 50.0000 mg | ORAL_TABLET | Freq: Two times a day (BID) | ORAL | Status: DC | PRN

## 2020-04-02 MED ORDER — ASPIRIN EC 81 MG PO TBEC
81.0000 mg | DELAYED_RELEASE_TABLET | Freq: Every day | ORAL | Status: DC
  Administered 2020-04-04 – 2020-04-06 (×3): 81 mg via ORAL
  Filled 2020-04-02 (×3): qty 1

## 2020-04-02 MED ORDER — SODIUM CHLORIDE 0.9 % IV SOLN: 250.0000 mL | INTRAVENOUS | Status: DC | PRN

## 2020-04-02 MED ORDER — MIRTAZAPINE 15 MG PO TABS
7.5000 mg | ORAL_TABLET | Freq: Every day | ORAL | Status: DC
  Administered 2020-04-04 – 2020-04-06 (×3): 7.5 mg via ORAL
  Filled 2020-04-02 (×3): qty 1

## 2020-04-02 MED ORDER — APIXABAN 2.5 MG PO TABS
2.5000 mg | ORAL_TABLET | Freq: Two times a day (BID) | ORAL | Status: DC
  Administered 2020-04-02 – 2020-04-06 (×7): 2.5 mg via ORAL
  Filled 2020-04-02 (×9): qty 1

## 2020-04-02 MED ORDER — CLONIDINE HCL 0.1 MG PO TABS: 0.1000 mg | ORAL_TABLET | Freq: Every day | ORAL | Status: DC | PRN

## 2020-04-02 MED ORDER — NALOXONE HCL 2 MG/2ML IJ SOSY
PREFILLED_SYRINGE | INTRAMUSCULAR | Status: AC
  Administered 2020-04-02: 0.5 mg via INTRAVENOUS
  Filled 2020-04-02: qty 2

## 2020-04-02 MED ORDER — POLYVINYL ALCOHOL-POVIDONE PF 1.4-0.6 % OP SOLN
1.0000 [drp] | Freq: Two times a day (BID) | OPHTHALMIC | Status: DC
Start: 2020-04-02 — End: 2020-04-03

## 2020-04-02 MED ORDER — OMEGA-3-ACID ETHYL ESTERS 1 G PO CAPS
1.0000 | ORAL_CAPSULE | Freq: Every day | ORAL | Status: DC
  Administered 2020-04-04 – 2020-04-06 (×3): 1 g via ORAL
  Filled 2020-04-02 (×4): qty 1

## 2020-04-02 MED ORDER — ONDANSETRON HCL 4 MG PO TABS: 4.0000 mg | ORAL_TABLET | Freq: Four times a day (QID) | ORAL | Status: DC | PRN

## 2020-04-02 MED ORDER — ISOSORBIDE MONONITRATE ER 60 MG PO TB24
30.0000 mg | ORAL_TABLET | ORAL | Status: AC
  Administered 2020-04-02: 30 mg via ORAL
  Filled 2020-04-02: qty 1

## 2020-04-02 MED ORDER — FLUTICASONE PROPIONATE 50 MCG/ACT NA SUSP: 2.0000 | Freq: Every day | NASAL | Status: DC

## 2020-04-02 MED ORDER — ACETAMINOPHEN 325 MG PO TABS
650.0000 mg | ORAL_TABLET | Freq: Four times a day (QID) | ORAL | Status: DC | PRN
  Administered 2020-04-02 – 2020-04-04 (×3): 650 mg via ORAL
  Filled 2020-04-02 (×2): qty 2

## 2020-04-02 MED ORDER — PANTOPRAZOLE SODIUM 40 MG PO TBEC
40.0000 mg | DELAYED_RELEASE_TABLET | Freq: Every day | ORAL | Status: DC
  Administered 2020-04-04 – 2020-04-06 (×3): 40 mg via ORAL
  Filled 2020-04-02 (×3): qty 1

## 2020-04-02 MED ORDER — INSULIN ASPART 100 UNIT/ML ~~LOC~~ SOLN
0.0000 [IU] | Freq: Three times a day (TID) | SUBCUTANEOUS | Status: DC
  Administered 2020-04-04 – 2020-04-06 (×5): 1 [IU] via SUBCUTANEOUS
  Filled 2020-04-02 (×6): qty 1

## 2020-04-02 MED ORDER — ACETAMINOPHEN 325 MG PO TABS
ORAL_TABLET | ORAL | Status: AC
  Filled 2020-04-02: qty 2

## 2020-04-02 MED ORDER — ATORVASTATIN CALCIUM 80 MG PO TABS
80.0000 mg | ORAL_TABLET | Freq: Every day | ORAL | Status: DC
  Administered 2020-04-02 – 2020-04-05 (×4): 80 mg via ORAL
  Filled 2020-04-02 (×3): qty 1
  Filled 2020-04-02: qty 4

## 2020-04-02 MED ORDER — TIMOLOL MALEATE 0.5 % OP SOLN: 1.0000 [drp] | Freq: Three times a day (TID) | OPHTHALMIC | Status: DC

## 2020-04-02 MED ORDER — CLONIDINE HCL 0.1 MG PO TABS
0.1000 mg | ORAL_TABLET | Freq: Once | ORAL | Status: AC
  Administered 2020-04-02: 0.1 mg via ORAL
  Filled 2020-04-02: qty 1

## 2020-04-02 MED ORDER — ONDANSETRON HCL 4 MG/2ML IJ SOLN
4.0000 mg | Freq: Once | INTRAMUSCULAR | Status: AC
  Administered 2020-04-02: 4 mg via INTRAVENOUS
  Filled 2020-04-02: qty 2

## 2020-04-02 NOTE — ED Notes (Signed)
Pt reports her chest is sore and she has a mild headache

## 2020-04-02 NOTE — ED Notes (Addendum)
Pt o2 via NRB  SPO2 100%

## 2020-04-02 NOTE — ED Notes (Signed)
IV access attempted by another RN, line blown when flushing. IV team consult order placed

## 2020-04-02 NOTE — ED Triage Notes (Signed)
Pt to ED via POV with c/o LUQ abdominal pain. Pt states pain x2 days. Pt states some diarrhea, also c/o nausea, denies vomiting. Pt denies known hx of abdominal problems.   Pt states is MWF dialysis patient, last dialysis Friday.

## 2020-04-02 NOTE — ED Notes (Signed)
Pt reports LUQ abd pain beginning 2 days ago, with pain radiating into left chest and back. Pt states pain feels "sharp, constant." Pt affirms nausea and "a little diarrhea." Pt denies swelling in abd or legs. BLE amputation noted.   Pt a&o x4.

## 2020-04-02 NOTE — ED Notes (Signed)
Pt states she does not really make urine anymore

## 2020-04-02 NOTE — ED Notes (Addendum)
Pt daughter reports that with a previous admission here "a couple years ago for her leg amputations," she had an adverse reaction to "whatever pain medication they gave her here." Pt daughter reports she hallucinated and would "space out." does not know what medication that was

## 2020-04-02 NOTE — ED Notes (Signed)
Pt resting, responsive to voice. Does open eyes and look around.   RR 10-12

## 2020-04-02 NOTE — ED Notes (Signed)
Pt requesting tylenol for back pain. Admitting MD messaged.   Will reposition pt with pillows and bed adjustment as well

## 2020-04-02 NOTE — ED Notes (Signed)
CPR begun, pt apneic, respiration via BVM  HR 54, then 42 RR 0 on own

## 2020-04-02 NOTE — ED Notes (Addendum)
Pt breathing on own, BVM for support. Pulse returned to 76  CPR stopped

## 2020-04-02 NOTE — H&P (Signed)
History and Physical    Ivy Puryear WUJ:[id number] DOB: [birth year] DOA: 04/02/2020  PCP: Kirk Ruths, MD   Patient coming from: Home  I have personally briefly reviewed patient's old medical records in [place]  Chief Complaint: Chest pain  HPI: Leonela Kivi is a 69 y.o. female with medical history significant for end-stage renal disease on hemodialysis, dialysis days are M/W/F, history of peripheral arterial disease status post bilateral BKA, diabetes mellitus and coronary artery disease who presents to the emergency room for evaluation of chest pain mostly over the anterior/lateral chest wall which she has had for 2 days.  Pain has been constant and not related to rest or exertion.  She rates today 5 x 10 in intensity at its worst. Chest pain is associated with nausea but she denies having any vomiting.  She denies having any cough, no fever, no chills or shortness of breath. While in the emergency room she received a dose of hydromorphone 0.5 mg IV and suddenly became unresponsive, confused with sonorous respiration.  She was unresponsive to painful or verbal stimuli.  Patient went into respiratory arrest, she became bradycardic and was noted to have PEA.  ACLS protocol was initiated and chest compressions were started.  Patient received 2 doses of Narcan with improvement in her mental status and improvement in her heart rate as well.  She is back to her baseline mental status. Labs show sodium 135, potassium 4.2, chloride 92, bicarb 28, glucose 185, BUN 35, creatinine 6.38, calcium 9.2, alkaline phosphatase 89, albumin 3.9, lipase 39, AST 19, ALT 24, total protein 7.2, troponin 80, lactic acid 1.1, white count 4.3, hemoglobin 12.8, hematocrit 42.2, MCV 97.9, RDW 16.5, platelet count 109 CT scan of abdomen and pelvis shows cholelithiasis with gallbladder wall thickening and pericholecystic fluid to may be redistributed.  Mild diffuse pancolonic mural thickening, nonspecific.   Fat and small bowel containing infraumbilical ventral hernia without convincing evidence of mechanical obstruction at this time.  Cardiomegaly with features of edema and bibasilar atelectasis. Chest x-ray shows findings reviewed by me shows CHF/volume overload with cardiomegaly and edema.  Bilateral pleural effusions. Twelve-lead EKG reviewed by me shows sinus bradycardia, right bundle branch block with left anterior fascicular block.  LVH   ED Course: Patient is a 69 year old African-American female who presents to the emergency room for evaluation of chest pain associated with nausea.  She received a dose of hydromorphone in the emergency room and went into respiratory arrest.  Chest compressions were initiated in the ER and patient received Narcan with improvement in her mental status.  She has a bump in her troponin and will be referred to observation status for further evaluation.  Review of Systems: As per HPI otherwise 10 point review of systems negative.    Past Medical History:  Diagnosis Date  . (HFpEF) heart failure with preserved ejection fraction ([place])    a. 01/2018 Echo: EF 50-55%, no rwma, Gr1 DD, triv AI, mild MR. Midly dil LA/RV, mod reduced RV fxn. Irreg thickening of TV w/ mobile echodensity that appears to arise from valve.  . Anemia   . Anorexia   . Bacteremia due to Pseudomonas   . Carotid arterial disease ([place])    a. 01/2017 Carotid U/S: RICA <95, LICA <62.  Marland Kitchen CHF (congestive heart failure) ([place])   . Complication of anesthesia    a. Pt reports h/o complication on 9 different occasions - ? hypotension/arrest.  . Coronary artery disease involving left main coronary artery 01/2015  a. 01/2015 Cath [facility]): 70% LM, p-mLAD 50-60% (Resting FFR 0.75), mRCA 80-90%, ~40 Ost OM & D1-->CABG; b. 01/2016 Staged PCI of LCX x 2 and RCA.  Marland Kitchen ESRD (end stage renal disease) on dialysis ([place])    a. ESRD secondary to acute kidney failure s/p CABG-->PD  . Essential hypertension   . GERD  (gastroesophageal reflux disease)   . Heart murmur   . Hypercholesterolemia   . Myocardial infarction ([place]) 2016  . PVD (peripheral vascular disease) ([place])    a. 01/30/2018 PV Angio: Sev Left Popliteal dzs s/p PTA w/ 31m lutonix DEB w/ mech aspiration of the L popliteal, L AT, and Left tibioperoneal trunck and peroneal artery.  . 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  [facility]: Dr. BMarland KitchenHaithcock: CABG x 3, LIMA to LAD, SVG to RCA, SVG to OM3, EVH  . Sinusitis 2019  . Type II diabetes mellitus with complication (HCC)    CAD    Past Surgical History:  Procedure Laterality Date  . ABDOMINAL HYSTERECTOMY    . AMPUTATION Right 02/25/2018   Procedure: AMPUTATION BELOW KNEE;  Surgeon: SKatha Cabal MD;  Location: [facility] ORS;  Service: Vascular;  Laterality: Right;  . AMPUTATION Left 03/11/2018   Procedure: AMPUTATION BELOW KNEE;  Surgeon: DAlgernon Huxley MD;  Location: [facility];  Service: Vascular;  Laterality: Left;  . AMPUTATION Bilateral 04/13/2018   Procedure: REVISION OF BILATERAL AMPUTATIONS BELOW KNEE WITH WOUND VAC APPLICATIONS;  Surgeon: EEvaristo Bury MD;  Location: [facility];  Service: Vascular;  Laterality: Bilateral;  . AMPUTATION TOE Left 02/06/2018   Procedure: AMPUTATION GREAT TOE;  Surgeon: FSamara Deist DPM;  Location: [facility];  Service: Podiatry;  Laterality: Left;  . APPLICATION OF WOUND VAC Left 04/13/2018   Procedure: APPLICATION OF WOUND VAC;  Surgeon: EEvaristo Bury MD;  Location: [facility];  Service: Vascular;  Laterality: Left;  . ARTERIOVENOUS GRAFT PLACEMENT  05/2016  . AV FISTULA PLACEMENT Left 05/30/2016   Procedure: ARTERIOVENOUS graft;  Surgeon: JAlgernon Huxley MD;  Location: [facility];  Service: Vascular;  Laterality: Left;  . CAPD INSERTION N/A 10/30/2017   Procedure: LAPAROSCOPIC INSERTION CONTINUOUS AMBULATORY PERITONEAL DIALYSIS  (CAPD) CATHETER;  Surgeon: DAlgernon Huxley MD;  Location: [facility];  Service: Vascular;  Laterality: N/A;  . CARDIAC CATHETERIZATION   01/2015   [facility]: Ost LM 70%, p-m LAD 50-60% (Rest FFR + @ 0.75), mRCA 80-90%, ostD1 40%, pOM1 40%  . CATARACT EXTRACTION W/PHACO Left 01/18/2016   Procedure: CATARACT EXTRACTION PHACO AND INTRAOCULAR LENS PLACEMENT (IOC);  Surgeon: BEulogio Bear MD;  Location: [facility];  Service: Ophthalmology;  Laterality: Left;  [place]1.05 AP% 15.5 CDE 10.16 Fluid Pack Lot # [id number]  . CATARACT EXTRACTION W/PHACO Right 08/01/2016   Procedure: CATARACT EXTRACTION PHACO AND INTRAOCULAR LENS PLACEMENT (IOC);  Surgeon: BEulogio Bear MD;  Location: [facility];  Service: Ophthalmology;  Laterality: Right;  [place]01:00.6 AP% 11.4 CDE 6.93  LOT # [id number]  . COLONOSCOPY    . CORONARY ANGIOPLASTY     SENTS 02/12/16  . CORONARY ARTERY BYPASS GRAFT  03/28/15    [facility]: Dr. HWaldemar Dickens LIMA to LAD, SVG to RCA, SVG to OM3, EVH  . DIALYSIS/PERMA CATHETER INSERTION N/A 02/11/2018   Procedure: DIALYSIS/PERMA CATHETER INSERTION;  Surgeon: DAlgernon Huxley MD;  [place] [facility];  Service: Cardiovascular;  Laterality: N/A;  . DIALYSIS/PERMA CATHETER REMOVAL Right 02/04/2018   Procedure: DIALYSIS/PERMA CATHETER REMOVAL;  Surgeon: SKatha Cabal  MD;  Location: [place] [facility];  Service: Cardiovascular;  Laterality: Right;  . ESOPHAGOGASTRODUODENOSCOPY (EGD) WITH PROPOFOL N/A 10/24/2016   Procedure: ESOPHAGOGASTRODUODENOSCOPY (EGD) WITH PROPOFOL;  Surgeon: [name], MD;  Location: [facility];  Service: Endoscopy;  Laterality: N/A;  . EYE SURGERY Bilateral    cataract surgery  . EYE SURGERY     drains for glaucoma  . INSERTION EXPRESS TUBE SHUNT Right 08/01/2016   Procedure: INSERTION EXPRESS TUBE SHUNT;  Surgeon: Eulogio Bear, MD;  Location: [facility];  Service: Ophthalmology;  Laterality: Right;  . INSERTION OF AHMED VALVE Left 08/15/2016   Procedure: INSERTION OF AHMED VALVE;  Surgeon: Eulogio Bear, MD;  Location: [facility];  Service: Ophthalmology;  Laterality: Left;  . INSERTION OF  DIALYSIS CATHETER    . LOWER EXTREMITY ANGIOGRAPHY Right 02/19/2018   Procedure: Lower Extremity Angiography;  Surgeon: Algernon Huxley, MD;  Location: [place] [facility];  Service: Cardiovascular;  Laterality: Right;  . LOWER EXTREMITY ANGIOGRAPHY Left 01/30/2018   Procedure: Lower Extremity Angiography;  Surgeon: Katha Cabal, MD;  Location: [place] [facility];  Service: Cardiovascular;  Laterality: Left;  . REMOVAL OF A DIALYSIS CATHETER N/A 02/25/2018   Procedure: REMOVAL OF A DIALYSIS CATHETER;  Surgeon: Katha Cabal, MD;  Location: [facility];  Service: Vascular;  Laterality: N/A;  . TEMPORARY DIALYSIS CATHETER N/A 02/06/2018   Procedure: TEMPORARY DIALYSIS CATHETER;  Surgeon: Katha Cabal, MD;  Location: [place] [facility];  Service: Cardiovascular;  Laterality: N/A;  . TRANSTHORACIC ECHOCARDIOGRAM  January 2017    EF 60-65%. GR 2 DD. Mild degenerative mitral valve disease but no prolapse or regurgitation. Mild left atrial dilation. Mild to moderate LVH. Pericardial effusion gone  . UPPER EXTREMITY ANGIOGRAPHY Left 06/27/2016   Procedure: Upper Extremity Angiography;  Surgeon: Algernon Huxley, MD;  Location: [place] [facility];  Service: Cardiovascular;  Laterality: Left;  . UPPER EXTREMITY ANGIOGRAPHY Left 08/22/2016   Procedure: Upper Extremity Angiography;  Surgeon: Algernon Huxley, MD;  Location: [place] [facility];  Service: Cardiovascular;  Laterality: Left;  Marland Kitchen VASCULAR SURGERY    . WOUND DEBRIDEMENT Left 05/08/2018   Procedure: DEBRIDEMENT OF LEFT LEG BKA;  Surgeon: Algernon Huxley, MD;  Location: [facility];  Service: General;  Laterality: Left;     reports that she has never smoked. She has never used smokeless tobacco. She reports that she does not drink alcohol and does not use drugs.  Allergies  Allergen Reactions  . Chlorthalidone Anaphylaxis, Itching and Rash  . Dilaudid [Hydromorphone Hcl] Anaphylaxis    Hypersensitivity. 0.1m caused pt to become unresponsive  .  Fentanyl Rash  . Midazolam Rash  . Morphine And Related Anaphylaxis    Pt unresponsive, apneic after 0.543mdilaudid   . Ace Inhibitors Other (See Comments)    Reaction:  Hyperkalemia, agitation   . Angiotensin Receptor Blockers Other (See Comments)    Reaction:  Hyperkalemia, agitation   . Norvasc [Amlodipine] Itching and Rash  . Phenergan [Promethazine Hcl] Anxiety    "antsy, can't sit still"    Family History  Problem Relation Age of Onset  . Diabetes Mellitus II Mother   . Heart failure Mother   . Pancreatic cancer Father      Prior to Admission medications   Medication Sig Start Date End Date Taking? Authorizing Provider  apixaban (ELIQUIS) 2.5 MG TABS tablet Take 1 tablet (2.5 mg total) by mouth 2 (two) times daily. 02/15/18   VaAnselm Jungling  Rosalio Macadamia, MD  aspirin EC 81 MG tablet Take 1 tablet (81 mg total) by mouth daily. 10/19/16   Dustin Flock, MD  atorvastatin (LIPITOR) 80 MG tablet Take 80 mg by mouth at bedtime.  05/05/17   [provider]  brimonidine (ALPHAGAN) 0.2 % ophthalmic solution Place 1 drop into both eyes 3 (three) times daily. 11/05/19   Ezekiel Slocumb, DO  carvedilol (COREG) 25 MG tablet Take 1 tablet (25 mg total) by mouth 2 (two) times daily. 07/13/18   Stark Jock Jude, MD  cloNIDine (CATAPRES) 0.1 MG tablet Take 0.1 mg by mouth daily as needed (BP >160/80).    [provider]  fluticasone (FLONASE) 50 MCG/ACT nasal spray Place 2 sprays into both nostrils daily.    [provider]  hydrALAZINE (APRESOLINE) 50 MG tablet Take 1 tablet (50 mg total) by mouth every 8 (eight) hours. 11/05/19   Nicole Kindred A, DO  insulin aspart (NOVOLOG) 100 UNIT/ML injection Inject 0-6 Units into the skin See admin instructions. Inject under the skin according to blood glucose reading:  <201: 0u 201- 250: 2u 251-300: 4u 301-350: 5u 351-400: 6u    [provider]  insulin glargine (LANTUS) 100 UNIT/ML injection Inject 5 Units into the skin  daily in the afternoon.     [provider]  ipratropium (ATROVENT) 0.06 % nasal spray Place 2 sprays into both nostrils 2 (two) times daily. 09/05/19   Loletha Grayer, MD  isosorbide mononitrate (IMDUR) 30 MG 24 hr tablet Take 30 mg by mouth daily.     [provider]  levETIRAcetam (KEPPRA) 250 MG tablet Three times weekly after dialysis Patient taking differently: Take 250 mg by mouth Every Tuesday,Thursday,and Saturday with dialysis.  05/12/18   Loletha Grayer, MD  meclizine (ANTIVERT) 25 MG tablet Take 25 mg by mouth every 4 (four) hours as needed for dizziness.    [provider]  mirtazapine (REMERON) 15 MG tablet Take 7.5 mg by mouth daily.     [provider]  Omega-3 1000 MG CAPS Take 2 capsules by mouth daily.     [provider]  pantoprazole (PROTONIX) 40 MG tablet Take 1 tablet (40 mg total) by mouth daily. 06/04/19   [name], MD  Polyvinyl Alcohol-Povidone PF (REFRESH) 1.4-0.6 % SOLN Place 1 drop into both eyes 2 (two) times a day.     [provider]  rOPINIRole (REQUIP) 0.25 MG tablet Take 0.25 mg by mouth at bedtime.    [provider]  sevelamer carbonate (RENVELA) 800 MG tablet 2 Tablet(s) By Mouth 3 Times Daily    [provider]  timolol (TIMOPTIC) 0.5 % ophthalmic solution Place 1 drop into both eyes 3 (three) times daily. 03/18/18   Bettey Costa, MD  traMADol (ULTRAM) 50 MG tablet Take 1 tablet (50 mg total) by mouth every 12 (twelve) hours as needed for severe pain. 09/05/19   Loletha Grayer, MD  triamcinolone cream (KENALOG) 0.1 % Apply topically. 11/01/19   [provider]  vitamin B-12 (CYANOCOBALAMIN) 1000 MCG tablet Take 1 tablet (1,000 mcg total) by mouth daily. 02/15/18   Vaughan Basta, MD    Physical Exam: Vitals:   04/02/20 1615 04/02/20 1645 04/02/20 1700 04/02/20 1710  BP: (!) 233/111 (!) 219/99 (!) 210/90   Pulse: 61 62 61 60  Resp: 13 11 11 13   Temp:       TempSrc:      SpO2: 100% 100% 100% 100%     Vitals:  04/02/20 1615 04/02/20 1645 04/02/20 1700 04/02/20 1710  BP: (!) 233/111 (!) 219/99 (!) 210/90   Pulse: 61 62 61 60  Resp: 13 11 11 13   Temp:      TempSrc:      SpO2: 100% 100% 100% 100%    Constitutional: NAD, alert and oriented x 3 Eyes: PERRL, lids and conjunctivae pallor ENMT: Mucous membranes are moist.  Neck: normal, supple, no masses, no thyromegaly Respiratory: Rales at the bases, no wheezing,. Normal respiratory effort. No accessory muscle use.  Cardiovascular: Regular rate and rhythm, no murmurs / rubs / gallops. No extremity edema. 2+ pedal pulses. No carotid bruits.  Abdomen: no tenderness, no masses palpated. No hepatosplenomegaly. Bowel sounds positive.  Musculoskeletal: no clubbing / cyanosis.  Bilateral BKA Skin: no rashes, lesions, ulcers.  Neurologic: No gross focal neurologic deficit. Psychiatric: Normal mood and affect.   Labs on Admission: I have personally reviewed following labs and imaging studies  CBC: Recent Labs  Lab 04/02/20 1112  WBC 4.3  HGB 12.8  HCT 42.2  MCV 97.9  PLT 244*   Basic Metabolic Panel: Recent Labs  Lab 04/02/20 1112  NA 135  K 4.2  CL 92*  CO2 28  GLUCOSE 185*  BUN 35*  CREATININE 6.38*  CALCIUM 9.2   GFR: CrCl cannot be calculated (Unknown ideal weight.). Liver Function Tests: Recent Labs  Lab 04/02/20 1112  AST 19  ALT 24  ALKPHOS 89  BILITOT 0.8  PROT 7.2  ALBUMIN 3.9   Recent Labs  Lab 04/02/20 1112  LIPASE 39   No results for input(s): AMMONIA in the last 168 hours. Coagulation Profile: No results for input(s): INR, PROTIME in the last 168 hours. Cardiac Enzymes: No results for input(s): CKTOTAL, CKMB, CKMBINDEX, TROPONINI in the last 168 hours. BNP (last 3 results) No results for input(s): PROBNP in the last 8760 hours. HbA1C: No results for input(s): HGBA1C in the last 72 hours. CBG: Recent Labs  Lab 04/02/20 1347  GLUCAP 200*    Lipid Profile: No results for input(s): CHOL, HDL, LDLCALC, TRIG, CHOLHDL, LDLDIRECT in the last 72 hours. Thyroid Function Tests: No results for input(s): TSH, T4TOTAL, FREET4, T3FREE, THYROIDAB in the last 72 hours. Anemia Panel: No results for input(s): VITAMINB12, FOLATE, FERRITIN, TIBC, IRON, RETICCTPCT in the last 72 hours. Urine analysis:    Component Value Date/Time   COLORURINE RED (A) 10/17/2016 1643   APPEARANCEUR TURBID (A) 10/17/2016 1643   LABSPEC 1.014 10/17/2016 1643   PHURINE  10/17/2016 1643    TEST NOT REPORTED DUE TO COLOR INTERFERENCE OF URINE PIGMENT   GLUCOSEU (A) 10/17/2016 1643    TEST NOT REPORTED DUE TO COLOR INTERFERENCE OF URINE PIGMENT   HGBUR (A) 10/17/2016 1643    TEST NOT REPORTED DUE TO COLOR INTERFERENCE OF URINE PIGMENT   BILIRUBINUR (A) 10/17/2016 1643    TEST NOT REPORTED DUE TO COLOR INTERFERENCE OF URINE PIGMENT   KETONESUR (A) 10/17/2016 1643    TEST NOT REPORTED DUE TO COLOR INTERFERENCE OF URINE PIGMENT   PROTEINUR (A) 10/17/2016 1643    TEST NOT REPORTED DUE TO COLOR INTERFERENCE OF URINE PIGMENT   NITRITE (A) 10/17/2016 1643    TEST NOT REPORTED DUE TO COLOR INTERFERENCE OF URINE PIGMENT   LEUKOCYTESUR (A) 10/17/2016 1643    TEST NOT REPORTED DUE TO COLOR INTERFERENCE OF URINE PIGMENT    Radiological Exams on Admission: CT ABDOMEN PELVIS WO CONTRAST  Result Date: 04/02/2020 CLINICAL DATA:  Left upper quadrant  abdominal pain EXAM: CT ABDOMEN AND PELVIS WITHOUT CONTRAST TECHNIQUE: Multidetector CT imaging of the abdomen and pelvis was performed following the standard protocol without IV contrast. COMPARISON:  CT 05/01/2019 FINDINGS: Lower chest: Atelectatic changes are present in the lung bases including more bandlike areas of subsegmental atelectasis. Some interlobular septal thickening is noted as well which could reflect early edema. Cardiomegaly appearance. Tip of a dual lumen dialysis catheter is noted. Post sternotomy changes.  Coronary artery calcifications. Hepatobiliary: No visible liver lesion on this unenhanced CT. Liver attenuation is within normal limits. Scattered calcifications in the liver are favored to be vascular in nature. Gallbladder wall appears thickened with some pericholecystic fluid though may be redistributed. Layering calcified gallstones and probable sludge present within the lumen. Pancreas: No pancreatic ductal dilatation or surrounding inflammatory changes. Spleen: Normal in size. No concerning splenic lesions. Linear branching calcifications are present. Adrenals/Urinary Tract: Normal adrenal glands. Kidneys are not symmetric in size and normally located. Extensive vascular calcifications in the renal hila. Few subcentimeter hypoattenuating foci too small to fully characterize on CT imaging but statistically likely benign. No visible or contour deforming renal lesions. No urolithiasis or hydronephrosis is evident. Urinary bladder is largely decompressed at the time of exam and therefore poorly evaluated by CT imaging. Circumferential bladder wall thickening is nonspecific given underdistention. Stomach/Bowel: Distal esophagus, stomach and duodenum are unremarkable. No conspicuous small bowel thickening or dilatation. Portion of small bowel protrudes into a small infraumbilical ventral hernia anteriorly albeit without convincing evidence of mechanical obstruction or vascular compromise at this time. There is some diffuse mild pancolonic mural thickening. Scattered colonic diverticula without focal inflammation to suggest diverticulitis. Vascular/Lymphatic: Extensive atherosclerotic calcification throughout the abdominal aorta and branch vessels. No aneurysm or ectasia. Limited evaluation the absence of contrast. No suspicious or enlarged lymph nodes in the included lymphatic chains. Reproductive: Uterus is surgically absent. No concerning adnexal lesions. Other: Small volume simple attenuation ascites. No free  air. Diffuse body wall edema. Fat and bowel containing infraumbilical ventral hernia as above. No other bowel containing hernia. No retroperitoneal or body wall hematoma. Musculoskeletal: Remote fracture deformity of the left inferior pubic ramus. No acute osseous abnormality or suspicious osseous lesion. Musculature is unremarkable. IMPRESSION: 1. Cholelithiasis with gallbladder wall thickening and pericholecystic fluid though may be redistributed. If there is clinical concern for acute cholecystitis, consider further evaluation with right upper quadrant ultrasound. 2. Mild diffuse pancolonic mural thickening, nonspecific, but can be seen with colitis or possibly related to the abdominal ascites. 3. Fat and small bowel containing infraumbilical ventral hernia anteriorly albeit without convincing evidence of mechanical obstruction or vascular compromise at this time. 4. Circumferential bladder wall thickening is nonspecific given underdistention. Recommend correlation with urinalysis to exclude cystitis. 5. Small volume simple attenuation ascites. Diffuse body wall edema. Correlate for features of anasarca. 6. Cardiomegaly with features of edema and basilar atelectasis. 7. Aortic Atherosclerosis (ICD10-I70.0). Electronically Signed   By: Lovena Le M.D.   On: 04/02/2020 15:19   DG Chest Portable 1 View  Result Date: 04/02/2020 CLINICAL DATA:  Left upper quadrant pain beginning 2 days prior radiating to left chest and back, history of CHF, CAD, diabetes EXAM: PORTABLE CHEST 1 VIEW COMPARISON:  Radiograph 11/03/2019, CT 09/02/2019 FINDINGS: Left IJ approach dual lumen dialysis catheter tip projects over the right atrium. Pacer pads and telemetry leads overlie the chest. Postsurgical changes related to prior CABG including intact and aligned sternotomy wires and multiple surgical clips projecting over the mediastinum. Cardiomegaly is similar to  prior portable radiography. The aorta is calcified. The remaining  cardiomediastinal contours are unremarkable. Mixed hazy and patchy opacities are present throughout both lungs with indistinct, congested pulmonary vascularity, fissural and septal thickening, and small bilateral effusions. Biapical pleuroparenchymal scarring. No pneumothorax. No acute osseous or soft tissue abnormality. Degenerative changes are present in the imaged spine and shoulders. IMPRESSION: 1. Findings compatible with CHF/volume overload with cardiomegaly and edema, effusions. 2. More coalescent airspace disease is likely to reflect alveolar edema though infection could present similarly. Electronically Signed   By: Lovena Le M.D.   On: 04/02/2020 15:11    EKG: Independently reviewed.  Sinus bradycardia, right bundle branch block Left anterior fascicular block  Assessment/Plan Principal Problem:   Chest pain Active Problems:   Elevated troponin   S/P CABG x 3   ESRD (end stage renal disease) on dialysis (HCC)   Type II diabetes mellitus with complication (HCC)   Coronary artery disease involving coronary bypass graft of native heart with angina pectoris (HCC)   Seizure disorder (HCC)   Hypertensive urgency   Chronic respiratory failure with hypoxia (HCC)   Chronic combined systolic and diastolic CHF (congestive heart failure) (HCC)   Thrombocytopenia (HCC)      Chest pain In a patient who is a vasculopath and has a history of diabetes mellitus, peripheral arterial disease and coronary artery disease status post CABG Troponin is slightly elevated when compared to her baseline We will cycle cardiac enzymes Continue aspirin, apixaban, statins and beta-blockers We will request cardiology consult   Hypertensive urgency Resume oral antihypertensive medications which include carvedilol, hydralazine and Imdur Will also give a one-time dose of clonidine and if blood pressure remains elevated we will start patient on labetalol drip to optimize blood pressure control   Diabetes  mellitus with complications of end-stage renal disease on hemodialysis Maintain carb consistent diet Glycemic control with sliding scale insulin Nephrology consult for renal replacement therapy   Chronic combined systolic and diastolic dysfunction CHF Patient's last known LVEF is about 35 to 40% from an echocardiogram done in 2019 Chest x-ray shows findings suggestive of CHF but patient is asymptomatic at this time and is not short of breath or hypoxic Nephrology consult has been requested for dialysis in a.m. 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 We will monitor closely for signs of bleeding   Peripheral arterial disease Status post bilateral BKA Continue statin, apixaban and carvedilol    Respiratory arrest Following administration of opiates for pain in the emergency room Patient had received a dose of hydromorphone and became unresponsive and went into PEA She received 2 doses of Narcan in the ER as well as chest compressions We will avoid opioids during this hospitalization  DVT prophylaxis: Apixaban Code Status: Full code Family Communication: Greater than 50% of time was spent discussing patient's condition and plan of care with at the bedside.  All questions and concerns have been addressed.  She verbalizes understanding and agrees with the plan. Disposition Plan: Back to previous home environment Consults called: Nephrology/cardiology    Esvin Hnat MD [facility]     04/02/2020, 5:29 PM

## 2020-04-02 NOTE — ED Notes (Signed)
Pt resting. Reactive to voice, able to respond appropriately. Pt states "my head hurts." Turns onto side, goes back to sleep

## 2020-04-02 NOTE — ED Notes (Signed)
Pt more responsive after 0.5m narcan, per EDP will hold other 0.527m

## 2020-04-02 NOTE — ED Notes (Signed)
0.83m dilaudid given slowly over 1 min. Did not follow with heavy flush

## 2020-04-02 NOTE — ED Notes (Signed)
Pt looking around, slow to respond, does know daughter is at bedside

## 2020-04-02 NOTE — ED Notes (Signed)
Pt noted to be breathing slower, approx 9-10/min. Pt snoring. Pt briefly responsive to voice and touch, but immediately falls back asleep. Unable to stay awake, woken repeatedly with same result. Admitting NP notified, EDP notified with verbal order received of 0.26m IV narcan. Narcan given with noticed improvement to LOC.   Pt alert and oriented, still tired but can stay awake to safely take PO fluids and meds. Situation explained to pt, pt verbalizes understanding. Returns to sleep after. Respirations normal

## 2020-04-02 NOTE — ED Notes (Signed)
Pt resting, eyes closed, respirations unlabored, even  RR 12

## 2020-04-02 NOTE — ED Notes (Addendum)
Pt becomes unresponsive, confused, snoring respirations, turns to side slightly. EDP notified  Pt unresponsive to pain

## 2020-04-02 NOTE — ED Notes (Signed)
Pt resting, even, strong respirations. Denies further needs. Pt daughter updated

## 2020-04-02 NOTE — ED Provider Notes (Signed)
[facility] Emergency Department Provider Note ____________________________________________   Event Date/Time   First MD Initiated Contact with Patient 04/02/20 1209     (approximate)  I have reviewed the triage vital signs and the nursing notes.  HISTORY  Chief Complaint Abdominal Pain   HPI Stephanie Ellis is a 69 y.o. female history of heart failure, CHF, dialysis end-stage renal disease  She has been experiencing 2 days of hard to describe left upper abdominal pain below her left rib in her abdomen.  Reports is not really chest pain but feels more like pain in her left upper abdomen.  Been ongoing for 2 days and associated with with slight loose stool.  Pain is moderate to severe in intensity  No fevers chills or chest pain except as a radiates from the chest  Had dialysis on Friday.  No issues during her hemodialysis.  Pain is hard to describe located in the left upper abdomen associated with nausea a couple loose nonbloody stools.  No vomiting.  No fevers.  Reports she has not wanted to eat anything last night or this morning    Past Medical History:  Diagnosis Date  . (HFpEF) heart failure with preserved ejection fraction ([place])    a. 01/2018 Echo: EF 50-55%, no rwma, Gr1 DD, triv AI, mild MR. Midly dil LA/RV, mod reduced RV fxn. Irreg thickening of TV w/ mobile echodensity that appears to arise from valve.  . Anemia   . Anorexia   . Bacteremia due to Pseudomonas   . Carotid arterial disease ([place])    a. 01/2017 Carotid U/S: RICA <70, LICA <78.  Marland Kitchen CHF (congestive heart failure) ([place])   . Complication of anesthesia    a. Pt reports h/o complication on 9 different occasions - ? hypotension/arrest.  . 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 01/2015 Cath [facility]): 70% LM, p-mLAD 50-60% (Resting FFR 0.75), mRCA 80-90%, ~40 Ost OM & D1-->CABG; b. 01/2016 Staged PCI of LCX x 2 and RCA.  Marland Kitchen ESRD (end stage renal disease) on  dialysis ([place])    a. ESRD secondary to acute kidney failure s/p CABG-->PD  . Essential hypertension   . GERD (gastroesophageal reflux disease)   . Heart murmur   . Hypercholesterolemia   . Myocardial infarction ([place]) 2016  . PVD (peripheral vascular disease) ([place])    a. 01/30/2018 PV Angio: Sev Left Popliteal dzs s/p PTA w/ 63m lutonix DEB w/ mech aspiration of the L popliteal, L AT, and Left tibioperoneal trunck and peroneal artery.  . 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  [facility]: Dr. BMarland KitchenHaithcock: CABG x 3, LIMA to LAD, SVG to RCA, SVG to OM3, EVH  . Sinusitis 2019  . Type II diabetes mellitus with complication ([facility]    CAD    Patient Active Problem List   Diagnosis Date Noted  . Chronic respiratory failure with hypoxia ([place] 11/03/2019  . Nocturnal hypoxia   . Shortness of breath   . Abdominal swelling   . Acute on chronic systolic CHF (congestive heart failure) ([place]   . Acute respiratory failure with hypoxia ([place] 09/02/2019  . Acute on chronic combined systolic (congestive) and diastolic (congestive) heart failure ([place] 09/02/2019  . 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 Severe anemia 05/01/2019  . Pneumonia due to COVID-19 virus 05/01/2019  . Resides in skilled nursing facility 12/29/2018  . CVA (cerebral vascular accident) ([place] 07/08/2018  . Infection of amputation stump ([place] 05/02/2018  .  BKA stump complication ([place]) 02/22/1593  . Unilateral complete BKA, right, initial encounter ([place]) 03/30/2018  . Transient loss of consciousness 03/19/2018  . Transient alteration of awareness   . VT (ventricular tachycardia) ([place])   . Disorientation   . Hypertensive urgency 03/08/2018  . Status post below-knee amputation of both lower extremities ([place]) 03/02/2018  . Ischemia of extremity 02/18/2018  . CKD (chronic kidney disease) stage 5, GFR less than 15 ml/min (HCC) 02/16/2018  . Diabetic osteomyelitis ([place])   . Peripheral vascular disease ([place])   . Advanced care planning/counseling  discussion   . Palliative care by specialist   . Goals of care, counseling/discussion   . Type 2 diabetes mellitus with hyperlipidemia ([place]) 01/30/2018  . Sepsis ([place]) 01/29/2018  . Peripheral neuropathy 06/17/2017  . Seizure disorder ([place]) 02/06/2017  . Accelerated hypertension 02/06/2017  . Endophthalmitis, left eye 12/05/2016  . Vomiting 10/24/2016  . Hematuria 10/17/2016  . Acute lower UTI 10/17/2016  . Anesthesia complication 58/59/2924  . Chest pain 09/04/2016  . Steal syndrome dialysis vascular access, initial encounter ([place]) 06/18/2016  . 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 Coronary artery disease involving coronary bypass graft of native heart with angina pectoris ([place]) 02/10/2016  . Hypertension 02/02/2016  . Hyperlipidemia 02/02/2016  . Coronary artery disease involving left main coronary artery 10/04/2015  . Abdominal pain of unknown etiology 10/04/2015  . ESRD (end stage renal disease) on dialysis ([place])   . Type II diabetes mellitus with complication ([place])   . NSTEMI (non-ST elevated myocardial infarction) ([place])   . Right sided abdominal pain   . Elevated troponin 10/03/2015  . 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 Acute on chronic combined systolic and diastolic CHF (congestive heart failure) ([place]) 04/19/2014  . Anemia 09/20/2013  . Routine health maintenance 01/29/2013    Past Surgical History:  Procedure Laterality Date  . ABDOMINAL HYSTERECTOMY    . AMPUTATION Right 02/25/2018   Procedure: AMPUTATION BELOW KNEE;  Surgeon: Katha Cabal, MD;  Location: [facility] ORS;  Service: Vascular;  Laterality: Right;  . AMPUTATION Left 03/11/2018   Procedure: AMPUTATION BELOW KNEE;  Surgeon: Algernon Huxley, MD;  Location: [facility] ORS;  Service: Vascular;  Laterality: Left;  . AMPUTATION Bilateral 04/13/2018   Procedure: REVISION OF BILATERAL AMPUTATIONS BELOW KNEE WITH WOUND VAC APPLICATIONS;  Surgeon: Evaristo Bury, MD;  Location: [facility] ORS;  Service: Vascular;   Laterality: Bilateral;  . AMPUTATION TOE Left 02/06/2018   Procedure: AMPUTATION GREAT TOE;  Surgeon: Samara Deist, DPM;  Location: [facility] ORS;  Service: Podiatry;  Laterality: Left;  . APPLICATION OF WOUND VAC Left 04/13/2018   Procedure: APPLICATION OF WOUND VAC;  Surgeon: Evaristo Bury, MD;  Location: [facility] OR[facility];  Service: Vascular;  Laterality: Left;  . ARTERIOVENOUS GRAFT PLACEMENT  05/2016  . AV FISTULA PLACEMENT Left 05/30/2016   Procedure: ARTERIOVENOUS graft;  Surgeon: Algernon Huxley, MD;  Location: [facility] OR[facility];  Service: Vascular;  Laterality: Left;  . CAPD INSERTION N/A 10/30/2017   Procedure: LAPAROSCOPIC INSERTION CONTINUOUS AMBULATORY PERITONEAL DIALYSIS  (CAPD) CATHETER;  Surgeon: Algernon Huxley, MD;  Location: [facility] ORS;  Service: Vascular;  Laterality: N/A;  . CARDIAC CATHETERIZATION  01/2015   [facility]: Ost LM 70%, p-m LAD 50-60% (Rest FFR + @ 0.75), mRCA 80-90%, ostD1 40%, pOM1 40%  . CATARACT EXTRACTION W/PHACO Left 01/18/2016   Procedure: CATARACT EXTRACTION PHACO AND INTRAOCULAR LENS PLACEMENT (IOC);  Surgeon: Eulogio Bear, MD;  Location: [facility] ORS;  Service: Ophthalmology;  Laterality: Left;  [place] 1.05 AP% 15.5 CDE 10.16 Fluid Pack Lot # [id number]  . CATARACT EXTRACTION W/PHACO Right 08/01/2016   Procedure: CATARACT EXTRACTION PHACO AND INTRAOCULAR LENS PLACEMENT (IOC);  Surgeon: Eulogio Bear, MD;  Location: [facility];  Service: Ophthalmology;  Laterality: Right;  [place] 01:00.6 AP% 11.4 CDE 6.93  LOT # [id number]  . COLONOSCOPY    . CORONARY ANGIOPLASTY     SENTS 02/12/16  . CORONARY ARTERY BYPASS GRAFT  03/28/15    [facility]: Dr. Waldemar Dickens: LIMA to LAD, SVG to RCA, SVG to OM3, EVH  . DIALYSIS/PERMA CATHETER INSERTION N/A 02/11/2018   Procedure: DIALYSIS/PERMA CATHETER INSERTION;  Surgeon: Algernon Huxley, MD;  Location: [place] [facility];  Service: Cardiovascular;  Laterality: N/A;  . DIALYSIS/PERMA CATHETER REMOVAL Right 02/04/2018   Procedure: DIALYSIS/PERMA CATHETER REMOVAL;   Surgeon: Katha Cabal, MD;  Location: [place] [facility];  Service: Cardiovascular;  Laterality: Right;  . ESOPHAGOGASTRODUODENOSCOPY (EGD) WITH PROPOFOL N/A 10/24/2016   Procedure: ESOPHAGOGASTRODUODENOSCOPY (EGD) WITH PROPOFOL;  Surgeon: [name], MD;  Location: [facility];  Service: Endoscopy;  Laterality: N/A;  . EYE SURGERY Bilateral    cataract surgery  . EYE SURGERY     drains for glaucoma  . INSERTION EXPRESS TUBE SHUNT Right 08/01/2016   Procedure: INSERTION EXPRESS TUBE SHUNT;  Surgeon: Eulogio Bear, MD;  Location: [facility];  Service: Ophthalmology;  Laterality: Right;  . INSERTION OF AHMED VALVE Left 08/15/2016   Procedure: INSERTION OF AHMED VALVE;  Surgeon: Eulogio Bear, MD;  Location: [facility];  Service: Ophthalmology;  Laterality: Left;  . INSERTION OF DIALYSIS CATHETER    . LOWER EXTREMITY ANGIOGRAPHY Right 02/19/2018   Procedure: Lower Extremity Angiography;  Surgeon: Algernon Huxley, MD;  Location: [place] [facility];  Service: Cardiovascular;  Laterality: Right;  . LOWER EXTREMITY ANGIOGRAPHY Left 01/30/2018   Procedure: Lower Extremity Angiography;  Surgeon: Katha Cabal, MD;  Location: [place] [facility];  Service: Cardiovascular;  Laterality: Left;  . REMOVAL OF A DIALYSIS CATHETER N/A 02/25/2018   Procedure: REMOVAL OF A DIALYSIS CATHETER;  Surgeon: Katha Cabal, MD;  Location: [facility];  Service: Vascular;  Laterality: N/A;  . TEMPORARY DIALYSIS CATHETER N/A 02/06/2018   Procedure: TEMPORARY DIALYSIS CATHETER;  Surgeon: Katha Cabal, MD;  Location: [place] [facility];  Service: Cardiovascular;  Laterality: N/A;  . TRANSTHORACIC ECHOCARDIOGRAM  January 2017    EF 60-65%. GR 2 DD. Mild degenerative mitral valve disease but no prolapse or regurgitation. Mild left atrial dilation. Mild to moderate LVH. Pericardial effusion gone  . UPPER EXTREMITY ANGIOGRAPHY Left 06/27/2016   Procedure: Upper Extremity Angiography;  Surgeon: Algernon Huxley, MD;  Location: [place] [facility];  Service: Cardiovascular;  Laterality: Left;  . UPPER EXTREMITY ANGIOGRAPHY Left 08/22/2016   Procedure: Upper Extremity Angiography;  Surgeon: Algernon Huxley, MD;  Location: [place] [facility];  Service: Cardiovascular;  Laterality: Left;  Marland Kitchen VASCULAR SURGERY    . WOUND DEBRIDEMENT Left 05/08/2018   Procedure: DEBRIDEMENT OF LEFT LEG BKA;  Surgeon: Algernon Huxley, MD;  Location: [facility];  Service: General;  Laterality: Left;    Prior to Admission medications   Medication Sig Start Date End Date Taking? Authorizing Provider  apixaban (ELIQUIS) 2.5 MG TABS tablet Take 1 tablet (2.5 mg total) by mouth 2 (two) times daily. 02/15/18   Vaughan Basta, MD  aspirin EC 81 MG tablet Take 1 tablet (81 mg total) by mouth  daily. 10/19/16   Dustin Flock, MD  atorvastatin (LIPITOR) 80 MG tablet Take 80 mg by mouth at bedtime.  05/05/17   [provider]  brimonidine (ALPHAGAN) 0.2 % ophthalmic solution Place 1 drop into both eyes 3 (three) times daily. 11/05/19   Ezekiel Slocumb, DO  carvedilol (COREG) 25 MG tablet Take 1 tablet (25 mg total) by mouth 2 (two) times daily. 07/13/18   Stark Jock Jude, MD  cloNIDine (CATAPRES) 0.1 MG tablet Take 0.1 mg by mouth daily as needed (BP >160/80).    [provider]  fluticasone (FLONASE) 50 MCG/ACT nasal spray Place 2 sprays into both nostrils daily.    [provider]  hydrALAZINE (APRESOLINE) 50 MG tablet Take 1 tablet (50 mg total) by mouth every 8 (eight) hours. 11/05/19   Nicole Kindred A, DO  insulin aspart (NOVOLOG) 100 UNIT/ML injection Inject 0-6 Units into the skin See admin instructions. Inject under the skin according to blood glucose reading:  <201: 0u 201- 250: 2u 251-300: 4u 301-350: 5u 351-400: 6u    [provider]  insulin glargine (LANTUS) 100 UNIT/ML injection Inject 5 Units into the skin daily in the afternoon.     [provider]  ipratropium (ATROVENT) 0.06  % nasal spray Place 2 sprays into both nostrils 2 (two) times daily. 09/05/19   Loletha Grayer, MD  isosorbide mononitrate (IMDUR) 30 MG 24 hr tablet Take 30 mg by mouth daily.     [provider]  levETIRAcetam (KEPPRA) 250 MG tablet Three times weekly after dialysis Patient taking differently: Take 250 mg by mouth Every Tuesday,Thursday,and Saturday with dialysis.  05/12/18   Loletha Grayer, MD  meclizine (ANTIVERT) 25 MG tablet Take 25 mg by mouth every 4 (four) hours as needed for dizziness.    [provider]  mirtazapine (REMERON) 15 MG tablet Take 7.5 mg by mouth daily.     [provider]  Omega-3 1000 MG CAPS Take 2 capsules by mouth daily.     [provider]  pantoprazole (PROTONIX) 40 MG tablet Take 1 tablet (40 mg total) by mouth daily. 06/04/19   [name], MD  Polyvinyl Alcohol-Povidone PF (REFRESH) 1.4-0.6 % SOLN Place 1 drop into both eyes 2 (two) times a day.     [provider]  rOPINIRole (REQUIP) 0.25 MG tablet Take 0.25 mg by mouth at bedtime.    [provider]  sevelamer carbonate (RENVELA) 800 MG tablet 2 Tablet(s) By Mouth 3 Times Daily    [provider]  timolol (TIMOPTIC) 0.5 % ophthalmic solution Place 1 drop into both eyes 3 (three) times daily. 03/18/18   Bettey Costa, MD  traMADol (ULTRAM) 50 MG tablet Take 1 tablet (50 mg total) by mouth every 12 (twelve) hours as needed for severe pain. 09/05/19   Loletha Grayer, MD  triamcinolone cream (KENALOG) 0.1 % Apply topically. 11/01/19   [provider]  vitamin B-12 (CYANOCOBALAMIN) 1000 MCG tablet Take 1 tablet (1,000 mcg total) by mouth daily. 02/15/18   Vaughan Basta, MD    Allergies Chlorthalidone, Dilaudid [hydromorphone hcl], Fentanyl, Midazolam, Morphine and related, Ace inhibitors, Angiotensin receptor blockers, Norvasc [amlodipine], and Phenergan [promethazine hcl]  Family History  Problem Relation Age of Onset  . Diabetes  Mellitus II Mother   . Heart failure Mother   . Pancreatic cancer Father     Social History Social History   Tobacco Use  . Smoking status: Never Smoker  . Smokeless tobacco: Never Used  Vaping  Use  . Vaping Use: Never used  Substance Use Topics  . Alcohol use: No  . Drug use: No    Review of Systems Constitutional: No fever/chills Eyes: No visual changes. ENT: No sore throat. Cardiovascular: Denies chest pain except some slight radiation of the left upper abdominal pain towards her left lower rib area. Respiratory: Denies shortness of breath. Gastrointestinal: Pain heart describe looking in the left upper abdomen only. Genitourinary: Negative for dysuria.  Has not urinated all Musculoskeletal: Negative for back pain.  Bilateral lower leg amputations Skin: Negative for rash. Neurological: Negative for headaches, areas of focal weakness or numbness.    ____________________________________________   PHYSICAL EXAM:  VITAL SIGNS: ED Triage Vitals  Enc Vitals Group     BP 04/02/20 1101 (!) 210/86     Pulse Rate 04/02/20 1101 62     Resp 04/02/20 1101 16     Temp 04/02/20 1101 98 F (36.7 C)     Temp Source 04/02/20 1101 Oral     SpO2 04/02/20 1101 100 %     Weight --      Height --      Head Circumference --      Peak Flow --      Pain Score 04/02/20 1109 9     Pain Loc --      Pain Edu? --      Excl. in [place]? --     Constitutional: Alert and oriented. Well appearing and in no acute distress.  Somewhat chronically ill but in no distress Eyes: Conjunctivae are normal. Head: Atraumatic. Nose: No congestion/rhinnorhea. Mouth/Throat: Mucous membranes are moist. Neck: No stridor.  Cardiovascular: Normal rate, regular rhythm. Grossly normal heart sounds.  Good peripheral circulation.  Left upper chest hemodialysis catheter intact Respiratory: Normal respiratory effort.  No retractions. Lungs CTAB. Gastrointestinal: Soft and nontender she does report a difficult to  describe discomfort when palpated in the left mid to left upper abdomen without rebound or guarding.  There is no distention. No distention. Musculoskeletal: No lower extremity tenderness nor edema.  Prior bilateral BKA's Neurologic:  Normal speech and language. No gross focal neurologic deficits are appreciated.  Skin:  Skin is warm, dry and intact. No rash noted. Psychiatric: Mood and affect are normal. Speech and behavior are normal.  ____________________________________________   LABS (all labs ordered are listed, but only abnormal results are displayed)  Labs Reviewed  COMPREHENSIVE METABOLIC PANEL - Abnormal; Notable for the following components:      Result Value   Chloride 92 (*)    Glucose, Bld 185 (*)    BUN 35 (*)    Creatinine, Ser 6.38 (*)    GFR, Estimated 7 (*)    All other components within normal limits  CBC - Abnormal; Notable for the following components:   RDW 16.5 (*)    Platelets 109 (*)    All other components within normal limits  CBG MONITORING, ED - Abnormal; Notable for the following components:   Glucose-Capillary 200 (*)    All other components within normal limits  TROPONIN I (HIGH SENSITIVITY) - Abnormal; Notable for the following components:   Troponin I (High Sensitivity) 80 (*)    All other components within normal limits  RESP PANEL BY RT-PCR (FLU A&B, COVID) ARPGX2  LIPASE, BLOOD  LACTIC ACID, PLASMA  URINALYSIS, COMPLETE (UACMP) WITH MICROSCOPIC   ____________________________________________  EKG  Reviewed inter by me at 1115 Heart rate 69 QRS 130 QTc 500 6 normal sinus rhythm,  left ventricular hypertrophy.  T wave inversions 1 aVL V2 likely repolarization abnormality.  Compared with previous EKG however inversions in 1 and aVL are now apparent ____________________________________________  RADIOLOGY  CT ABDOMEN PELVIS WO CONTRAST  Result Date: 04/02/2020 CLINICAL DATA:  Left upper quadrant abdominal pain EXAM: CT ABDOMEN AND  PELVIS WITHOUT CONTRAST TECHNIQUE: Multidetector CT imaging of the abdomen and pelvis was performed following the standard protocol without IV contrast. COMPARISON:  CT 05/01/2019 FINDINGS: Lower chest: Atelectatic changes are present in the lung bases including more bandlike areas of subsegmental atelectasis. Some interlobular septal thickening is noted as well which could reflect early edema. Cardiomegaly appearance. Tip of a dual lumen dialysis catheter is noted. Post sternotomy changes. Coronary artery calcifications. Hepatobiliary: No visible liver lesion on this unenhanced CT. Liver attenuation is within normal limits. Scattered calcifications in the liver are favored to be vascular in nature. Gallbladder wall appears thickened with some pericholecystic fluid though may be redistributed. Layering calcified gallstones and probable sludge present within the lumen. Pancreas: No pancreatic ductal dilatation or surrounding inflammatory changes. Spleen: Normal in size. No concerning splenic lesions. Linear branching calcifications are present. Adrenals/Urinary Tract: Normal adrenal glands. Kidneys are not symmetric in size and normally located. Extensive vascular calcifications in the renal hila. Few subcentimeter hypoattenuating foci too small to fully characterize on CT imaging but statistically likely benign. No visible or contour deforming renal lesions. No urolithiasis or hydronephrosis is evident. Urinary bladder is largely decompressed at the time of exam and therefore poorly evaluated by CT imaging. Circumferential bladder wall thickening is nonspecific given underdistention. Stomach/Bowel: Distal esophagus, stomach and duodenum are unremarkable. No conspicuous small bowel thickening or dilatation. Portion of small bowel protrudes into a small infraumbilical ventral hernia anteriorly albeit without convincing evidence of mechanical obstruction or vascular compromise at this time. There is some diffuse mild  pancolonic mural thickening. Scattered colonic diverticula without focal inflammation to suggest diverticulitis. Vascular/Lymphatic: Extensive atherosclerotic calcification throughout the abdominal aorta and branch vessels. No aneurysm or ectasia. Limited evaluation the absence of contrast. No suspicious or enlarged lymph nodes in the included lymphatic chains. Reproductive: Uterus is surgically absent. No concerning adnexal lesions. Other: Small volume simple attenuation ascites. No free air. Diffuse body wall edema. Fat and bowel containing infraumbilical ventral hernia as above. No other bowel containing hernia. No retroperitoneal or body wall hematoma. Musculoskeletal: Remote fracture deformity of the left inferior pubic ramus. No acute osseous abnormality or suspicious osseous lesion. Musculature is unremarkable. IMPRESSION: 1. Cholelithiasis with gallbladder wall thickening and pericholecystic fluid though may be redistributed. If there is clinical concern for acute cholecystitis, consider further evaluation with right upper quadrant ultrasound. 2. Mild diffuse pancolonic mural thickening, nonspecific, but can be seen with colitis or possibly related to the abdominal ascites. 3. Fat and small bowel containing infraumbilical ventral hernia anteriorly albeit without convincing evidence of mechanical obstruction or vascular compromise at this time. 4. Circumferential bladder wall thickening is nonspecific given underdistention. Recommend correlation with urinalysis to exclude cystitis. 5. Small volume simple attenuation ascites. Diffuse body wall edema. Correlate for features of anasarca. 6. Cardiomegaly with features of edema and basilar atelectasis. 7. Aortic Atherosclerosis (ICD10-I70.0). Electronically Signed   By: Lovena Le M.D.   On: 04/02/2020 15:19   DG Chest Portable 1 View  Result Date: 04/02/2020 CLINICAL DATA:  Left upper quadrant pain beginning 2 days prior radiating to left chest and back,  history of CHF, CAD, diabetes EXAM: PORTABLE CHEST 1 VIEW COMPARISON:  Radiograph 11/03/2019, CT  09/02/2019 FINDINGS: Left IJ approach dual lumen dialysis catheter tip projects over the right atrium. Pacer pads and telemetry leads overlie the chest. Postsurgical changes related to prior CABG including intact and aligned sternotomy wires and multiple surgical clips projecting over the mediastinum. Cardiomegaly is similar to prior portable radiography. The aorta is calcified. The remaining cardiomediastinal contours are unremarkable. Mixed hazy and patchy opacities are present throughout both lungs with indistinct, congested pulmonary vascularity, fissural and septal thickening, and small bilateral effusions. Biapical pleuroparenchymal scarring. No pneumothorax. No acute osseous or soft tissue abnormality. Degenerative changes are present in the imaged spine and shoulders. IMPRESSION: 1. Findings compatible with CHF/volume overload with cardiomegaly and edema, effusions. 2. More coalescent airspace disease is likely to reflect alveolar edema though infection could present similarly. Electronically Signed   By: Lovena Le M.D.   On: 04/02/2020 15:11    Chest x-ray reviewed, compatible with possible CHF or volume overload.  CT scan results reviewed, no obvious acute intra-abdominal findings though there is concerning pathology around the gallbladder or possibly representative of edema.  Please see full report.  However in the context of my examination and evaluation with out right upper quadrant abdominal pain I find this unlikely there is a obvious acute cholecystitis.  Her LFTs in addition are normal without obvious obstructive etiology.  I did discuss with the hospitalist Dr. Francine Graven, who will be admitting the patient for observation and consideration for further work-up of persistent abdominal pain symptoms right upper quadrant  ultrasound ____________________________________________   PROCEDURES  Procedure(s) performed: CPR  Procedures  Cardiopulmonary Resuscitation (CPR) Procedure Note Directed/Performed by: Delman Kitten I personally directed ancillary staff and/or performed CPR in an effort to regain return of spontaneous circulation and to maintain cardiac, neuro and systemic perfusion.    Critical Care performed: Yes, see critical care note(s)  CRITICAL CARE Performed by: Delman Kitten   Total critical care time: 25 minutes  Critical care time was exclusive of separately billable procedures and treating other patients.  Critical care was necessary to treat or prevent imminent or life-threatening deterioration.  Critical care was time spent personally by me on the following activities: development of treatment plan with patient and/or surrogate as well as nursing, discussions with consultants, evaluation of patient's response to treatment, examination of patient, obtaining history from patient or surrogate, ordering and performing treatments and interventions, ordering and review of laboratory studies, ordering and review of radiographic studies, pulse oximetry and re-evaluation of patient's condition.  ____________________________________________   INITIAL IMPRESSION / ASSESSMENT AND PLAN / ED COURSE  Pertinent labs & imaging results that were available during my care of the patient were reviewed by me and considered in my medical decision making (see chart for details).   Differential diagnosis includes but is not limited to, abdominal perforation, aortic dissection, cholecystitis, appendicitis, diverticulitis, colitis, esophagitis/gastritis, kidney stone, pyelonephritis, urinary tract infection, aortic aneurysm. All are considered in decision and treatment plan. Based upon the patient's presentation and risk factors, we will proceed with CT imaging.  I did discuss the case with Dr. Juleen China, who advised  okay to give IV iodinated contrast from renal perspective given plan for dialysis tomorrow  ----------------------------------------- 2:28 PM on 04/02/2020 -----------------------------------------  Within only a couple minutes of receiving hydromorphone the patient was noted to have rapid onset and somnolence, and then nurse asked me to evaluate for sudden change.  Patient is found to be apneic with concern for faint carotid pulse versus PEA. Felt respiratory arrest, peri-cardiac arrest vs arrest.  CPR initiated naloxone given with good effect within only a couple minutes the patient becomes alert oriented recognizing her daughter place and situation.  I confirmed with the nurse dosing and nurse reports patient was dosed correctly at 0.5 mg dilaudid given as a slow IV push with nurse at bedside  The patient recovered well and we have continue to observe her for the last several hours.  She is doing well alert oriented.  I believe that she experienced cardiorespiratory arrest likely from administration of hydromorphone, although this seems highly unusual given the low dose which was particularly chosen as she is a renal patient.  She does have severe hypertension as well, after observation and noted persistent hypertension we will treat with her home medications.  She will be admitted for further care work-up and close observation.  Mild EKG abnormality compared with previous though no obvious cardiac chest pain seems to be left upper quadrant in nature though certainly could be atypical presentation.  Will monitor patient closely     ____________________________________________   FINAL CLINICAL IMPRESSION(S) / ED DIAGNOSES  Final diagnoses:  Respiratory arrest ([place])  LUQ abdominal pain  Biliary calculus of other site without obstruction  EKG changes, mildly elevated troponin      Note:  This document was prepared using Dragon voice recognition software and may include unintentional  dictation errors       Delman Kitten, MD 04/02/20 1711

## 2020-04-02 NOTE — ED Notes (Signed)
Pharmacy called for med rec

## 2020-04-02 NOTE — ED Notes (Signed)
Pt moving head, opening eyes

## 2020-04-02 NOTE — ED Notes (Signed)
Telemetry still has difficulty catching respirations, electrode has been moved 3-4 times now. RR counted manually, 12-15/min, even unlabored  Pt asleep

## 2020-04-02 NOTE — ED Notes (Signed)
Unsuccessful IV start. Will get another RN

## 2020-04-03 ENCOUNTER — Inpatient Hospital Stay: Payer: Medicare Other

## 2020-04-03 LAB — BASIC METABOLIC PANEL
Anion gap: 15 (ref 5–15)
BUN: 43 mg/dL — ABNORMAL HIGH (ref 8–23)
CO2: 24 mmol/L (ref 22–32)
Calcium: 8.1 mg/dL — ABNORMAL LOW (ref 8.9–10.3)
Chloride: 96 mmol/L — ABNORMAL LOW (ref 98–111)
Creatinine, Ser: 7.19 mg/dL — ABNORMAL HIGH (ref 0.44–1.00)
GFR, Estimated: 6 mL/min — ABNORMAL LOW (ref >60)
Glucose, Bld: 94 mg/dL (ref 70–99)
Potassium: 4.5 mmol/L (ref 3.5–5.1)
Sodium: 135 mmol/L (ref 135–145)

## 2020-04-03 LAB — CBC
HCT: 39 % (ref 36.0–46.0)
Hemoglobin: 12 g/dL (ref 12.0–15.0)
MCH: 30.2 pg (ref 26.0–34.0)
MCHC: 30.8 g/dL (ref 30.0–36.0)
MCV: 98 fL (ref 80.0–100.0)
Platelets: 104 10*3/uL — ABNORMAL LOW (ref 150–400)
RBC: 3.98 MIL/uL (ref 3.87–5.11)
RDW: 16.9 % — ABNORMAL HIGH (ref 11.5–15.5)
WBC: 4.7 10*3/uL (ref 4.0–10.5)
nRBC: 0 % (ref 0.0–0.2)

## 2020-04-03 LAB — CBG MONITORING, ED
Glucose-Capillary: 97 mg/dL (ref 70–99)
Glucose-Capillary: 146 mg/dL — ABNORMAL HIGH (ref 70–99)
Glucose-Capillary: 260 mg/dL — ABNORMAL HIGH (ref 70–99)

## 2020-04-03 MED ORDER — FLUTICASONE PROPIONATE 50 MCG/ACT NA SUSP
2.0000 | Freq: Every day | NASAL | Status: DC
  Administered 2020-04-04 – 2020-04-06 (×3): 2 via NASAL
  Filled 2020-04-03: qty 16

## 2020-04-03 MED ORDER — BRIMONIDINE TARTRATE 0.2 % OP SOLN
1.0000 [drp] | Freq: Three times a day (TID) | OPHTHALMIC | Status: DC
  Administered 2020-04-03 – 2020-04-06 (×8): 1 [drp] via OPHTHALMIC
  Filled 2020-04-03 (×2): qty 5

## 2020-04-03 MED ORDER — CHLORHEXIDINE GLUCONATE CLOTH 2 % EX PADS
6.0000 | MEDICATED_PAD | Freq: Every day | CUTANEOUS | Status: DC
  Administered 2020-04-04 – 2020-04-06 (×2): 6 via TOPICAL
  Filled 2020-04-03 (×2): qty 6

## 2020-04-03 MED ORDER — TIMOLOL MALEATE 0.5 % OP SOLN
1.0000 [drp] | Freq: Three times a day (TID) | OPHTHALMIC | Status: DC
  Administered 2020-04-03 – 2020-04-06 (×7): 1 [drp] via OPHTHALMIC
  Filled 2020-04-03 (×2): qty 5

## 2020-04-03 MED ORDER — POLYVINYL ALCOHOL 1.4 % OP SOLN
1.0000 [drp] | Freq: Two times a day (BID) | OPHTHALMIC | Status: DC
  Administered 2020-04-03 – 2020-04-06 (×6): 1 [drp] via OPHTHALMIC
  Filled 2020-04-03 (×2): qty 15

## 2020-04-03 NOTE — Progress Notes (Signed)
[facility]  ROUNDING NOTE   Subjective:   [name] was admitted to [facility] on 04/02/2020 for Chest pain [R07.9]  Last hemodialysis treatment was 12/9 - she tolerated treatment well.  Patient brought to dialysis unit for her regularly scheduled hemodialysis treatment. Seen and examined on hemodialysis treatment. Tolerating treatment well.    Objective:  Vital signs in last 24 hours:  Temp:  [98.2 F (36.8 C)] 98.2 F (36.8 C) (12/13 1012) Pulse Rate:  [53-75] 60 (12/13 1230) Resp:  [8-26] 16 (12/13 1230) BP: (114-246)/(53-135) 186/69 (12/13 1230) SpO2:  [92 %-100 %] 100 % (12/13 1200) Weight:  [55.8 kg] 55.8 kg (12/12 1742)  Weight change:  Filed Weights   04/02/20 1742  Weight: 55.8 kg    Intake/Output: I/O last 3 completed shifts: In: 3 [I.V.:3] Out: -    Intake/Output this shift:  No intake/output data recorded.  Physical Exam: General: NAD, laying in bed  Head: Normocephalic, atraumatic. Moist oral mucosal membranes  Eyes: Anicteric, PERRL  Neck: Supple, trachea midline  Lungs:  Clear to auscultation  Heart: Regular rate and rhythm  Abdomen:  Soft, nontender,   Extremities:  bilateral BKA  Neurologic: Nonfocal, moving all four extremities  Skin: No lesions  Access: LIJ permcath    Basic Metabolic Panel: Recent Labs  Lab 04/02/20 1112 04/03/20 0659  NA 135 135  K 4.2 4.5  CL 92* 96*  CO2 28 24  GLUCOSE 185* 94  BUN 35* 43*  CREATININE 6.38* 7.19*  CALCIUM 9.2 8.1*    Liver Function Tests: Recent Labs  Lab 04/02/20 1112  AST 19  ALT 24  ALKPHOS 89  BILITOT 0.8  PROT 7.2  ALBUMIN 3.9   Recent Labs  Lab 04/02/20 1112  LIPASE 39   No results for input(s): AMMONIA in the last 168 hours.  CBC: Recent Labs  Lab 04/02/20 1112 04/03/20 0659  WBC 4.3 4.7  HGB 12.8 12.0  HCT 42.2 39.0  MCV 97.9 98.0  PLT 109* 104*    Cardiac Enzymes: No results for input(s): CKTOTAL, CKMB, CKMBINDEX, TROPONINI in the last 168  hours.  BNP: Invalid input(s): POCBNP  CBG: Recent Labs  Lab 04/02/20 1347 04/03/20 0732  GLUCAP 200* 97    Microbiology: Results for orders placed or performed during the hospital encounter of 04/02/20  Resp Panel by RT-PCR (Flu A&B, Covid) Nasopharyngeal Swab     Status: None   Collection Time: 04/02/20  2:25 PM   Specimen: Nasopharyngeal Swab; Nasopharyngeal(NP) swabs in vial transport medium  Result Value Ref Range Status   SARS Coronavirus 2 by RT PCR NEGATIVE NEGATIVE Final    Comment: (NOTE) SARS-CoV-2 target nucleic acids are NOT DETECTED.  The SARS-CoV-2 RNA is generally detectable in upper respiratory specimens during the acute phase of infection. The lowest concentration of SARS-CoV-2 viral copies this assay can detect is 138 copies/mL. A negative result does not preclude SARS-Cov-2 infection and should not be used as the sole basis for treatment or other patient management decisions. A negative result may occur with  improper specimen collection/handling, submission of specimen other than nasopharyngeal swab, presence of viral mutation(s) within the areas targeted by this assay, and inadequate number of viral copies(<138 copies/mL). A negative result must be combined with clinical observations, patient history, and epidemiological information. The expected result is Negative.  Fact Sheet for Patients:  EntrepreneurPulse.com.au  Fact Sheet for Healthcare Providers:  IncredibleEmployment.be  This test is no t yet approved or cleared by the  [place] Architectural [employer] and  has been authorized for detection and/or diagnosis of SARS-CoV-2 by FDA under an Print production planner (EUA). This EUA will remain  in effect (meaning this test can be used) for the duration of the COVID-19 declaration under Section 564(b)(1) of the Act, 21 U.S.C.section 360bbb-3(b)(1), unless the authorization is terminated  or revoked sooner.        Influenza A by PCR NEGATIVE NEGATIVE Final   Influenza B by PCR NEGATIVE NEGATIVE Final    Comment: (NOTE) The Xpert Xpress SARS-CoV-2/FLU/RSV plus assay is intended as an aid in the diagnosis of influenza from Nasopharyngeal swab specimens and should not be used as a sole basis for treatment. Nasal washings and aspirates are unacceptable for Xpert Xpress SARS-CoV-2/FLU/RSV testing.  Fact Sheet for Patients: EntrepreneurPulse.com.au  Fact Sheet for Healthcare Providers: IncredibleEmployment.be  This test is not yet approved or cleared by the [place] FDA and has been authorized for detection and/or diagnosis of SARS-CoV-2 by FDA under an Emergency Use Authorization (EUA). This EUA will remain in effect (meaning this test can be used) for the duration of the COVID-19 declaration under Section 564(b)(1) of the Act, 21 U.S.C. section 360bbb-3(b)(1), unless the authorization is terminated or revoked.  Performed at [facility], [address] [place], [place] [address]     Coagulation Studies: No results for input(s): LABPROT, INR in the last 72 hours.  Urinalysis: No results for input(s): COLORURINE, LABSPEC, PHURINE, GLUCOSEU, HGBUR, BILIRUBINUR, KETONESUR, PROTEINUR, UROBILINOGEN, NITRITE, LEUKOCYTESUR in the last 72 hours.  Invalid input(s): APPERANCEUR    Imaging: CT ABDOMEN PELVIS WO CONTRAST  Result Date: 04/02/2020 CLINICAL DATA:  Left upper quadrant abdominal pain EXAM: CT ABDOMEN AND PELVIS WITHOUT CONTRAST TECHNIQUE: Multidetector CT imaging of the abdomen and pelvis was performed following the standard protocol without IV contrast. COMPARISON:  CT 05/01/2019 FINDINGS: Lower chest: Atelectatic changes are present in the lung bases including more bandlike areas of subsegmental atelectasis. Some interlobular septal thickening is noted as well which could reflect early edema. Cardiomegaly appearance. Tip of a dual lumen  dialysis catheter is noted. Post sternotomy changes. Coronary artery calcifications. Hepatobiliary: No visible liver lesion on this unenhanced CT. Liver attenuation is within normal limits. Scattered calcifications in the liver are favored to be vascular in nature. Gallbladder wall appears thickened with some pericholecystic fluid though may be redistributed. Layering calcified gallstones and probable sludge present within the lumen. Pancreas: No pancreatic ductal dilatation or surrounding inflammatory changes. Spleen: Normal in size. No concerning splenic lesions. Linear branching calcifications are present. Adrenals/Urinary Tract: Normal adrenal glands. Kidneys are not symmetric in size and normally located. Extensive vascular calcifications in the renal hila. Few subcentimeter hypoattenuating foci too small to fully characterize on CT imaging but statistically likely benign. No visible or contour deforming renal lesions. No urolithiasis or hydronephrosis is evident. Urinary bladder is largely decompressed at the time of exam and therefore poorly evaluated by CT imaging. Circumferential bladder wall thickening is nonspecific given underdistention. Stomach/Bowel: Distal esophagus, stomach and duodenum are unremarkable. No conspicuous small bowel thickening or dilatation. Portion of small bowel protrudes into a small infraumbilical ventral hernia anteriorly albeit without convincing evidence of mechanical obstruction or vascular compromise at this time. There is some diffuse mild pancolonic mural thickening. Scattered colonic diverticula without focal inflammation to suggest diverticulitis. Vascular/Lymphatic: Extensive atherosclerotic calcification throughout the abdominal aorta and branch vessels. No aneurysm or ectasia. Limited evaluation the absence of contrast. No suspicious or enlarged lymph nodes in the included lymphatic  chains. Reproductive: Uterus is surgically absent. No concerning adnexal lesions. Other:  Small volume simple attenuation ascites. No free air. Diffuse body wall edema. Fat and bowel containing infraumbilical ventral hernia as above. No other bowel containing hernia. No retroperitoneal or body wall hematoma. Musculoskeletal: Remote fracture deformity of the left inferior pubic ramus. No acute osseous abnormality or suspicious osseous lesion. Musculature is unremarkable. IMPRESSION: 1. Cholelithiasis with gallbladder wall thickening and pericholecystic fluid though may be redistributed. If there is clinical concern for acute cholecystitis, consider further evaluation with right upper quadrant ultrasound. 2. Mild diffuse pancolonic mural thickening, nonspecific, but can be seen with colitis or possibly related to the abdominal ascites. 3. Fat and small bowel containing infraumbilical ventral hernia anteriorly albeit without convincing evidence of mechanical obstruction or vascular compromise at this time. 4. Circumferential bladder wall thickening is nonspecific given underdistention. Recommend correlation with urinalysis to exclude cystitis. 5. Small volume simple attenuation ascites. Diffuse body wall edema. Correlate for features of anasarca. 6. Cardiomegaly with features of edema and basilar atelectasis. 7. Aortic Atherosclerosis (ICD10-I70.0). Electronically Signed   By: Lovena Le M.D.   On: 04/02/2020 15:19   DG Chest Portable 1 View  Result Date: 04/02/2020 CLINICAL DATA:  Left upper quadrant pain beginning 2 days prior radiating to left chest and back, history of CHF, CAD, diabetes EXAM: PORTABLE CHEST 1 VIEW COMPARISON:  Radiograph 11/03/2019, CT 09/02/2019 FINDINGS: Left IJ approach dual lumen dialysis catheter tip projects over the right atrium. Pacer pads and telemetry leads overlie the chest. Postsurgical changes related to prior CABG including intact and aligned sternotomy wires and multiple surgical clips projecting over the mediastinum. Cardiomegaly is similar to prior portable  radiography. The aorta is calcified. The remaining cardiomediastinal contours are unremarkable. Mixed hazy and patchy opacities are present throughout both lungs with indistinct, congested pulmonary vascularity, fissural and septal thickening, and small bilateral effusions. Biapical pleuroparenchymal scarring. No pneumothorax. No acute osseous or soft tissue abnormality. Degenerative changes are present in the imaged spine and shoulders. IMPRESSION: 1. Findings compatible with CHF/volume overload with cardiomegaly and edema, effusions. 2. More coalescent airspace disease is likely to reflect alveolar edema though infection could present similarly. Electronically Signed   By: Lovena Le M.D.   On: 04/02/2020 15:11     Medications:   . sodium chloride     . apixaban  2.5 mg Oral BID  . aspirin EC  81 mg Oral Daily  . atorvastatin  80 mg Oral QHS  . brimonidine  1 drop Both Eyes TID  . carvedilol  25 mg Oral BID  . Chlorhexidine Gluconate Cloth  6 each Topical Q0600  . fluticasone  2 spray Each Nare Daily  . hydrALAZINE  50 mg Oral Q8H  . insulin aspart  0-6 Units Subcutaneous TID WC  . isosorbide mononitrate  30 mg Oral Daily  . [START ON 04/04/2020] levETIRAcetam  250 mg Oral Q T,Th,Sa-HD  . mirtazapine  7.5 mg Oral Daily  . Omega-3  2 capsule Oral Daily  . pantoprazole  40 mg Oral Daily  . Polyvinyl Alcohol-Povidone PF  1 drop Both Eyes BID  . rOPINIRole  0.25 mg Oral QHS  . sevelamer carbonate  1,600 mg Oral TID WC  . sodium chloride flush  3 mL Intravenous Q12H  . timolol  1 drop Both Eyes TID  . vitamin B-12  1,000 mcg Oral Daily   sodium chloride, acetaminophen, cloNIDine, iohexol, meclizine, ondansetron **OR** ondansetron (ZOFRAN) IV, sodium chloride flush  Assessment/ Plan:  [name] is a 69 y.o. black female with end stage renal disease on hemodialysis, peripheral vascular disease status post bilateral BKA, diabetes mellitus type II, coronary artery disease status  post CABG admitted to [facility] on 04/02/2020 for Chest pain [R07.9]  [facility] MWF [facility] [address] LIJ permcath 52kg  1. End Stage Renal Disease: seen and examined on hemodialysis treatment.   2. Hypertension: elevated on 186/69. Home regimen of carvedilol, clonidine, hydralazine, isosorbide mononitrate.   3. Anemia of chronic kidney disease:  Hemoglobin 12. Hold ESA due to possible ischemic event. Mircera as outpatient.   4. Secondary Hyperparathyroidism:  - sevelamer with meals.    LOS: 1 Stephanie Ellis 12/13/202112:48 PM

## 2020-04-03 NOTE — ED Notes (Signed)
Pt gown changed, Zoll pads removed, extra pillow provided, pt repositioned, lights dimmed, meal tray trash discarded. Pt states no further needs at this time  Pt alert and oriented x4, NAD

## 2020-04-03 NOTE — Consult Note (Signed)
CARDIOLOGY CONSULT NOTE               Patient ID: Stephanie Ellis MRN: [id number] DOB/AGE: 12-31-1950 69 y.o.  Admit date: 04/02/2020 Referring Physician Dr Francine Graven hospitalist Primary Physician Frazier Richards primary Primary Cardiologist Blanchie Serve, [facility] Reason for Consultation chest pain possible unstable angina  HPI: Patient is a 69 year old female multiple medical problems multivessel coronary disease coronary bypass surgery PCI and stent diastolic dysfunction heart failure bilateral BKA's carotid disease end-stage renal disease on dialysis hypertension GERD diabetes hyperlipidemia.  Patient had an episode of chest pain so came to emergency room for evaluation but was pain-free in the ER on supplemental oxygen therapy elevated blood pressure chronic systolic and diastolic dysfunction.  Review of systems complete and found to be negative unless listed above     Past Medical History:  Diagnosis Date  . (HFpEF) heart failure with preserved ejection fraction ([place])    a. 01/2018 Echo: EF 50-55%, no rwma, Gr1 DD, triv AI, mild MR. Midly dil LA/RV, mod reduced RV fxn. Irreg thickening of TV w/ mobile echodensity that appears to arise from valve.  . Anemia   . Anorexia   . Bacteremia due to Pseudomonas   . Carotid arterial disease ([place])    a. 01/2017 Carotid U/S: RICA <67, LICA <12.  Marland Kitchen CHF (congestive heart failure) ([place])   . Complication of anesthesia    a. Pt reports h/o complication on 9 different occasions - ? hypotension/arrest.  . 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 01/2015 Cath [facility]): 70% LM, p-mLAD 50-60% (Resting FFR 0.75), mRCA 80-90%, ~40 Ost OM & D1-->CABG; b. 01/2016 Staged PCI of LCX x 2 and RCA.  Marland Kitchen ESRD (end stage renal disease) on dialysis ([place])    a. ESRD secondary to acute kidney failure s/p CABG-->PD  . Essential hypertension   . GERD (gastroesophageal reflux disease)   . Heart murmur   . Hypercholesterolemia   .  Myocardial infarction ([place]) 2016  . PVD (peripheral vascular disease) ([place])    a. 01/30/2018 PV Angio: Sev Left Popliteal dzs s/p PTA w/ 66m lutonix DEB w/ mech aspiration of the L popliteal, L AT, and Left tibioperoneal trunck and peroneal artery.  . 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  [facility]: Dr. BMarland KitchenHaithcock: CABG x 3, LIMA to LAD, SVG to RCA, SVG to OM3, EVH  . Sinusitis 2019  . Type II diabetes mellitus with complication (HCC)    CAD    Past Surgical History:  Procedure Laterality Date  . ABDOMINAL HYSTERECTOMY    . AMPUTATION Right 02/25/2018   Procedure: AMPUTATION BELOW KNEE;  Surgeon: SKatha Cabal MD;  Location: [facility] ORS;  Service: Vascular;  Laterality: Right;  . AMPUTATION Left 03/11/2018   Procedure: AMPUTATION BELOW KNEE;  Surgeon: DAlgernon Huxley MD;  Location: [facility];  Service: Vascular;  Laterality: Left;  . AMPUTATION Bilateral 04/13/2018   Procedure: REVISION OF BILATERAL AMPUTATIONS BELOW KNEE WITH WOUND VAC APPLICATIONS;  Surgeon: EEvaristo Bury MD;  Location: [facility];  Service: Vascular;  Laterality: Bilateral;  . AMPUTATION TOE Left 02/06/2018   Procedure: AMPUTATION GREAT TOE;  Surgeon: FSamara Deist DPM;  Location: [facility];  Service: Podiatry;  Laterality: Left;  . APPLICATION OF WOUND VAC Left 04/13/2018   Procedure: APPLICATION OF WOUND VAC;  Surgeon: EEvaristo Bury MD;  Location: [facility];  Service: Vascular;  Laterality: Left;  . ARTERIOVENOUS GRAFT PLACEMENT  05/2016  . AV  FISTULA PLACEMENT Left 05/30/2016   Procedure: ARTERIOVENOUS graft;  Surgeon: Algernon Huxley, MD;  Location: [facility];  Service: Vascular;  Laterality: Left;  . CAPD INSERTION N/A 10/30/2017   Procedure: LAPAROSCOPIC INSERTION CONTINUOUS AMBULATORY PERITONEAL DIALYSIS  (CAPD) CATHETER;  Surgeon: Algernon Huxley, MD;  Location: [facility];  Service: Vascular;  Laterality: N/A;  . CARDIAC CATHETERIZATION  01/2015   [facility]: Ost LM 70%, p-m LAD 50-60% (Rest FFR + @ 0.75), mRCA 80-90%, ostD1 40%,  pOM1 40%  . CATARACT EXTRACTION W/PHACO Left 01/18/2016   Procedure: CATARACT EXTRACTION PHACO AND INTRAOCULAR LENS PLACEMENT (IOC);  Surgeon: Eulogio Bear, MD;  Location: [facility];  Service: Ophthalmology;  Laterality: Left;  [place] 1.05 AP% 15.5 CDE 10.16 Fluid Pack Lot # [id number]  . CATARACT EXTRACTION W/PHACO Right 08/01/2016   Procedure: CATARACT EXTRACTION PHACO AND INTRAOCULAR LENS PLACEMENT (IOC);  Surgeon: Eulogio Bear, MD;  Location: [facility];  Service: Ophthalmology;  Laterality: Right;  [place] 01:00.6 AP% 11.4 CDE 6.93  LOT # [id number]  . COLONOSCOPY    . CORONARY ANGIOPLASTY     SENTS 02/12/16  . CORONARY ARTERY BYPASS GRAFT  03/28/15    [facility]: Dr. Waldemar Dickens: LIMA to LAD, SVG to RCA, SVG to OM3, EVH  . DIALYSIS/PERMA CATHETER INSERTION N/A 02/11/2018   Procedure: DIALYSIS/PERMA CATHETER INSERTION;  Surgeon: Algernon Huxley, MD;  Location: [place] [facility];  Service: Cardiovascular;  Laterality: N/A;  . DIALYSIS/PERMA CATHETER REMOVAL Right 02/04/2018   Procedure: DIALYSIS/PERMA CATHETER REMOVAL;  Surgeon: Katha Cabal, MD;  Location: [place] [facility];  Service: Cardiovascular;  Laterality: Right;  . ESOPHAGOGASTRODUODENOSCOPY (EGD) WITH PROPOFOL N/A 10/24/2016   Procedure: ESOPHAGOGASTRODUODENOSCOPY (EGD) WITH PROPOFOL;  Surgeon: [name], MD;  Location: [facility];  Service: Endoscopy;  Laterality: N/A;  . EYE SURGERY Bilateral    cataract surgery  . EYE SURGERY     drains for glaucoma  . INSERTION EXPRESS TUBE SHUNT Right 08/01/2016   Procedure: INSERTION EXPRESS TUBE SHUNT;  Surgeon: Eulogio Bear, MD;  Location: [facility];  Service: Ophthalmology;  Laterality: Right;  . INSERTION OF AHMED VALVE Left 08/15/2016   Procedure: INSERTION OF AHMED VALVE;  Surgeon: Eulogio Bear, MD;  Location: [facility];  Service: Ophthalmology;  Laterality: Left;  . INSERTION OF DIALYSIS CATHETER    . LOWER EXTREMITY ANGIOGRAPHY Right 02/19/2018   Procedure: Lower  Extremity Angiography;  Surgeon: Algernon Huxley, MD;  Location: [place] [facility];  Service: Cardiovascular;  Laterality: Right;  . LOWER EXTREMITY ANGIOGRAPHY Left 01/30/2018   Procedure: Lower Extremity Angiography;  Surgeon: Katha Cabal, MD;  Location: [place] [facility];  Service: Cardiovascular;  Laterality: Left;  . REMOVAL OF A DIALYSIS CATHETER N/A 02/25/2018   Procedure: REMOVAL OF A DIALYSIS CATHETER;  Surgeon: Katha Cabal, MD;  Location: [facility];  Service: Vascular;  Laterality: N/A;  . TEMPORARY DIALYSIS CATHETER N/A 02/06/2018   Procedure: TEMPORARY DIALYSIS CATHETER;  Surgeon: Katha Cabal, MD;  Location: [place] [facility];  Service: Cardiovascular;  Laterality: N/A;  . TRANSTHORACIC ECHOCARDIOGRAM  January 2017    EF 60-65%. GR 2 DD. Mild degenerative mitral valve disease but no prolapse or regurgitation. Mild left atrial dilation. Mild to moderate LVH. Pericardial effusion gone  . UPPER EXTREMITY ANGIOGRAPHY Left 06/27/2016   Procedure: Upper Extremity Angiography;  Surgeon: Algernon Huxley, MD;  Location: [place] [facility];  Service: Cardiovascular;  Laterality: Left;  . UPPER EXTREMITY ANGIOGRAPHY Left 08/22/2016  Procedure: Upper Extremity Angiography;  Surgeon: Algernon Huxley, MD;  Location: [place] [facility];  Service: Cardiovascular;  Laterality: Left;  Marland Kitchen VASCULAR SURGERY    . WOUND DEBRIDEMENT Left 05/08/2018   Procedure: DEBRIDEMENT OF LEFT LEG BKA;  Surgeon: Algernon Huxley, MD;  Location: [facility];  Service: General;  Laterality: Left;    (Not in a hospital admission)  Social History   Socioeconomic History  . Marital status: Single    Spouse name: Not on file  . Number of children: Not on file  . Years of education: Not on file  . Highest education level: Not on file  Occupational History  . Not on file  Tobacco Use  . Smoking status: Never Smoker  . Smokeless tobacco: Never Used  Vaping Use  . Vaping Use: Never used  Substance and Sexual  Activity  . Alcohol use: No  . Drug use: No  . Sexual activity: Never  Other Topics Concern  . Not on file  Social History Narrative   Lives in [place] with family.   Social Determinants of Health   Financial Resource Strain: Not on file  Food Insecurity: Not on file  Transportation Needs: Not on file  Physical Activity: Not on file  Stress: Not on file  Social Connections: Not on file  Intimate Partner Violence: Not on file    Family History  Problem Relation Age of Onset  . Diabetes Mellitus II Mother   . Heart failure Mother   . Pancreatic cancer Father       Review of systems complete and found to be negative unless listed above      PHYSICAL EXAM  General: Well developed, well nourished, in no acute distress HEENT:  Normocephalic and atramatic Neck:  No JVD.  Lungs: Clear bilaterally to auscultation and percussion. Heart: HRRR . Normal S1 and S2 without gallops or murmurs.  Abdomen: Bowel sounds are positive, abdomen soft and non-tender  Msk:  Back normal, normal gait. Normal strength and tone for age. Extremities: No clubbing, cyanosis or edema.  Bilateral BKA's Neuro: Alert and oriented X 3. Psych:  Good affect, responds appropriately  Labs:   Lab Results  Component Value Date   WBC 4.7 04/03/2020   HGB 12.0 04/03/2020   HCT 39.0 04/03/2020   MCV 98.0 04/03/2020   PLT 104 (L) 04/03/2020    Recent Labs  Lab 04/02/20 1112 04/03/20 0659  NA 135 135  K 4.2 4.5  CL 92* 96*  CO2 28 24  BUN 35* 43*  CREATININE 6.38* 7.19*  CALCIUM 9.2 8.1*  PROT 7.2  --   BILITOT 0.8  --   ALKPHOS 89  --   ALT 24  --   AST 19  --   GLUCOSE 185* 94   Lab Results  Component Value Date   TROPONINI 0.09 (HH) 07/10/2018    Lab Results  Component Value Date   CHOL 153 07/08/2018   Lab Results  Component Value Date   HDL 35 (L) 07/08/2018   Lab Results  Component Value Date   LDLCALC 55 07/08/2018   Lab Results  Component Value Date   TRIG 313 (H)  07/08/2018   Lab Results  Component Value Date   CHOLHDL 4.4 07/08/2018   No results found for: LDLDIRECT    Radiology: CT ABDOMEN PELVIS WO CONTRAST  Result Date: 04/02/2020 CLINICAL DATA:  Left upper quadrant abdominal pain EXAM: CT ABDOMEN AND PELVIS WITHOUT CONTRAST TECHNIQUE: Multidetector CT imaging  of the abdomen and pelvis was performed following the standard protocol without IV contrast. COMPARISON:  CT 05/01/2019 FINDINGS: Lower chest: Atelectatic changes are present in the lung bases including more bandlike areas of subsegmental atelectasis. Some interlobular septal thickening is noted as well which could reflect early edema. Cardiomegaly appearance. Tip of a dual lumen dialysis catheter is noted. Post sternotomy changes. Coronary artery calcifications. Hepatobiliary: No visible liver lesion on this unenhanced CT. Liver attenuation is within normal limits. Scattered calcifications in the liver are favored to be vascular in nature. Gallbladder wall appears thickened with some pericholecystic fluid though may be redistributed. Layering calcified gallstones and probable sludge present within the lumen. Pancreas: No pancreatic ductal dilatation or surrounding inflammatory changes. Spleen: Normal in size. No concerning splenic lesions. Linear branching calcifications are present. Adrenals/Urinary Tract: Normal adrenal glands. Kidneys are not symmetric in size and normally located. Extensive vascular calcifications in the renal hila. Few subcentimeter hypoattenuating foci too small to fully characterize on CT imaging but statistically likely benign. No visible or contour deforming renal lesions. No urolithiasis or hydronephrosis is evident. Urinary bladder is largely decompressed at the time of exam and therefore poorly evaluated by CT imaging. Circumferential bladder wall thickening is nonspecific given underdistention. Stomach/Bowel: Distal esophagus, stomach and duodenum are unremarkable. No  conspicuous small bowel thickening or dilatation. Portion of small bowel protrudes into a small infraumbilical ventral hernia anteriorly albeit without convincing evidence of mechanical obstruction or vascular compromise at this time. There is some diffuse mild pancolonic mural thickening. Scattered colonic diverticula without focal inflammation to suggest diverticulitis. Vascular/Lymphatic: Extensive atherosclerotic calcification throughout the abdominal aorta and branch vessels. No aneurysm or ectasia. Limited evaluation the absence of contrast. No suspicious or enlarged lymph nodes in the included lymphatic chains. Reproductive: Uterus is surgically absent. No concerning adnexal lesions. Other: Small volume simple attenuation ascites. No free air. Diffuse body wall edema. Fat and bowel containing infraumbilical ventral hernia as above. No other bowel containing hernia. No retroperitoneal or body wall hematoma. Musculoskeletal: Remote fracture deformity of the left inferior pubic ramus. No acute osseous abnormality or suspicious osseous lesion. Musculature is unremarkable. IMPRESSION: 1. Cholelithiasis with gallbladder wall thickening and pericholecystic fluid though may be redistributed. If there is clinical concern for acute cholecystitis, consider further evaluation with right upper quadrant ultrasound. 2. Mild diffuse pancolonic mural thickening, nonspecific, but can be seen with colitis or possibly related to the abdominal ascites. 3. Fat and small bowel containing infraumbilical ventral hernia anteriorly albeit without convincing evidence of mechanical obstruction or vascular compromise at this time. 4. Circumferential bladder wall thickening is nonspecific given underdistention. Recommend correlation with urinalysis to exclude cystitis. 5. Small volume simple attenuation ascites. Diffuse body wall edema. Correlate for features of anasarca. 6. Cardiomegaly with features of edema and basilar atelectasis. 7.  Aortic Atherosclerosis (ICD10-I70.0). Electronically Signed   By: Lovena Le M.D.   On: 04/02/2020 15:19   DG Chest Portable 1 View  Result Date: 04/02/2020 CLINICAL DATA:  Left upper quadrant pain beginning 2 days prior radiating to left chest and back, history of CHF, CAD, diabetes EXAM: PORTABLE CHEST 1 VIEW COMPARISON:  Radiograph 11/03/2019, CT 09/02/2019 FINDINGS: Left IJ approach dual lumen dialysis catheter tip projects over the right atrium. Pacer pads and telemetry leads overlie the chest. Postsurgical changes related to prior CABG including intact and aligned sternotomy wires and multiple surgical clips projecting over the mediastinum. Cardiomegaly is similar to prior portable radiography. The aorta is calcified. The remaining cardiomediastinal contours are unremarkable.  Mixed hazy and patchy opacities are present throughout both lungs with indistinct, congested pulmonary vascularity, fissural and septal thickening, and small bilateral effusions. Biapical pleuroparenchymal scarring. No pneumothorax. No acute osseous or soft tissue abnormality. Degenerative changes are present in the imaged spine and shoulders. IMPRESSION: 1. Findings compatible with CHF/volume overload with cardiomegaly and edema, effusions. 2. More coalescent airspace disease is likely to reflect alveolar edema though infection could present similarly. Electronically Signed   By: Lovena Le M.D.   On: 04/02/2020 15:11    EKG: Sinus bradycardia right bundle branch block left anterior hemiblock LVH nonspecific ST-T wave changes  ASSESSMENT AND PLAN:  Chest pain Possible unstable angina Known multivessel coronary disease Severe peripheral vascular disease End-stage renal disease on dialysis Shortness of breath PEA cardiac arrest Abnormal EKG Diabetes type 2 . Plan Agree with admit Recommend heparin for anticoagulation therapy Echocardiogram was helpful for left ventricular function Continue dialysis  therapy Continue lipid therapy for hyperlipidemia Follow-up troponins and EKGs Continue diabetes management and control Recommend conservative approach for now  Signed: Yolonda Kida MD 04/03/2020, 9:30 AM

## 2020-04-03 NOTE — Progress Notes (Signed)
Patient to dialysis.

## 2020-04-03 NOTE — Progress Notes (Signed)
PROGRESS NOTE    Stephanie Ellis  EAV:[id number] DOB: [birth year] DOA: 04/02/2020 PCP: Kirk Ruths, MD   Brief Narrative:  HPI: Stephanie Ellis is a 69 y.o. female with medical history significant for end-stage renal disease on hemodialysis, dialysis days are M/W/F, history of peripheral arterial disease status post bilateral BKA, diabetes mellitus and coronary artery disease who presents to the emergency room for evaluation of chest pain mostly over the anterior/lateral chest wall which she has had for 2 days.  Pain has been constant and not related to rest or exertion.  She rates today 5 x 10 in intensity at its worst. Chest pain is associated with nausea but she denies having any vomiting.  She denies having any cough, no fever, no chills or shortness of breath. While in the emergency room she received a dose of hydromorphone 0.5 mg IV and suddenly became unresponsive, confused with sonorous respiration.  She was unresponsive to painful or verbal stimuli.  Patient went into respiratory arrest, she became bradycardic and was noted to have PEA.  ACLS protocol was initiated and chest compressions were started.  Patient received 2 doses of Narcan with improvement in her mental status and improvement in her heart rate as well.  She is back to her baseline mental status. Labs show sodium 135, potassium 4.2, chloride 92, bicarb 28, glucose 185, BUN 35, creatinine 6.38, calcium 9.2, alkaline phosphatase 89, albumin 3.9, lipase 39, AST 19, ALT 24, total protein 7.2, troponin 80, lactic acid 1.1, white count 4.3, hemoglobin 12.8, hematocrit 42.2, MCV 97.9, RDW 16.5, platelet count 109 CT scan of abdomen and pelvis shows cholelithiasis with gallbladder wall thickening and pericholecystic fluid to may be redistributed.  Mild diffuse pancolonic mural thickening, nonspecific.  Fat and small bowel containing infraumbilical ventral hernia without convincing evidence of mechanical obstruction at this time.   Cardiomegaly with features of edema and bibasilar atelectasis. Chest x-ray shows findings reviewed by me shows CHF/volume overload with cardiomegaly and edema.  Bilateral pleural effusions. Twelve-lead EKG reviewed by me shows sinus bradycardia, right bundle branch block with left anterior fascicular block.  LVH   ED Course: Patient is a 69 year old African-American female who presents to the emergency room for evaluation of chest pain associated with nausea.  She received a dose of hydromorphone in the emergency room and went into respiratory arrest.  Chest compressions were initiated in the ER and patient received Narcan with improvement in her mental status.  She has a bump in her troponin and will be referred to observation status for further evaluation.  Assessment & Plan:   Principal Problem:   Chest pain Active Problems:   Elevated troponin   S/P CABG x 3   ESRD (end stage renal disease) on dialysis (HCC)   Type II diabetes mellitus with complication (HCC)   Coronary artery disease involving coronary bypass graft of native heart with angina pectoris (HCC)   Seizure disorder (HCC)   Hypertensive urgency   Chronic respiratory failure with hypoxia (HCC)   Chronic combined systolic and diastolic CHF (congestive heart failure) (HCC)   Thrombocytopenia (HCC)   Chest pain: She still complains of left lower rib cage pain.  She tells me that she started having pain after CPR was done.  Troponin x2 only slightly elevated and flat, not indicative of ACS.  She is tender to palpation as well which indicates possible muscular pain.  History of CAD status post CABG: Troponin as above. Continue aspirin, apixaban, statins and beta-blockers.  Cardiology on  board.  ESRD on HD: Nephrology on board.  She gets dialysis on MWF.  Hypertensive urgency: Presented with systolic blood pressure over 937 and diastolic over 342.  Continue all home medications which include carvedilol, hydralazine and Imdur  and as needed clonidine.  Diabetes mellitus type II: She seems to be taking Lantus only 5 units at home.  Currently hemoglobin A1c 7.8.  Blood sugar control.  Continue SSI.  Acute on chronic combined systolic and diastolic dysfunction CHF Patient's last known LVEF is about 35 to 40% from an echocardiogram done in 2019 Chest x-ray shows findings suggestive of CHF but patient is asymptomatic at this time and is not short of breath or hypoxic.  Nephrology on board and hopefully dialysis will help improve that.  Seizure disorder Continue Keppra Place patient on seizure precautions  Chronic Thrombocytopenia: At baseline.  No signs of bleeding.  Monitor.  Peripheral arterial disease Status post bilateral BKA Continue statin, apixaban and carvedilol  Respiratory arrest: Following administration of opiates for pain in the emergency room Patient had received a dose of hydromorphone and became unresponsive and went into PEA She received 2 doses of Narcan in the ER as well as chest compressions We will avoid opioids during this hospitalization.  Patient alert and oriented and without any shortness of breath or hypoxia.  Cholelithiasis and ?  Acute cholecystitis: CT abdomen pelvis indicates possibility of acute cholecystitis.  Will pursue ultrasound abdomen for further evaluation.  Intermittent chronic diarrhea/possible colitis: Patient tells me that she has diarrhea now but she also tells me that she has intermittent diarrhea for several years however her CT abdomen and pelvis shows possibility of colitis so we will test for C. difficile and GI pathogen panel.  Keep on isolation for now.  Will treat only when evidence of positive infection.  DVT prophylaxis: apixaban (ELIQUIS) tablet 2.5 mg Start: 04/02/20 2200   Code Status: Full Code  Family Communication: None present at bedside.  Plan of care discussed with patient in length and he verbalized understanding and agreed with it.  Status  is: Inpatient  Remains inpatient appropriate because:Hemodynamically unstable and Ongoing diagnostic testing needed not appropriate for outpatient work up   Dispo: The patient is from: Home              Anticipated d/c is to: Home              Anticipated d/c date is: 2 days              Patient currently is not medically stable to d/c.        Estimated body mass index is 31.98 kg/m as calculated from the following:   Height as of 11/04/19: 4' 4"  (1.321 m).   Weight as of this encounter: 55.8 kg.      Nutritional status:               Consultants:   Cardiology  Nephrology  Procedures:   None  Antimicrobials:  Anti-infectives (From admission, onward)   None         Subjective: Patient seen and examined.  She still complains of left lower rib anterior pain.  On examination, she has point tenderness.  She has no other complaint.  Denied any shortness of breath.    Objective: Vitals:   04/03/20 0730 04/03/20 0800 04/03/20 0900 04/03/20 1012  BP: (!) 158/61 (!) 130/58 (!) 114/53 (!) 129/53  Pulse: 67 65  74  Resp: 17 14 (!) 8 14  Temp:    98.2 F (36.8 C)  TempSrc:    Axillary  SpO2: 100% 100%  100%  Weight:        Intake/Output Summary (Last 24 hours) at 04/03/2020 1131 Last data filed at 04/02/2020 2201 Gross per 24 hour  Intake 3 ml  Output -  Net 3 ml   Filed Weights   04/02/20 1742  Weight: 55.8 kg    Examination:  General exam: Appears calm and comfortable, cachectic and chronically sick looking Respiratory system: Crackles bilaterally. Respiratory effort normal.  Point tenderness at the left anterior lower rib cage Cardiovascular system: S1 & S2 heard, RRR. No JVD, murmurs, rubs, gallops or clicks. No pedal edema. Gastrointestinal system: Abdomen is nondistended, soft and nontender. No organomegaly or masses felt. Normal bowel sounds heard. Central nervous system: Alert and oriented. No focal neurological deficits. Extremities:  Bilateral BKA Skin: No rashes, lesions or ulcers    Data Reviewed: I have personally reviewed following labs and imaging studies  CBC: Recent Labs  Lab 04/02/20 1112 04/03/20 0659  WBC 4.3 4.7  HGB 12.8 12.0  HCT 42.2 39.0  MCV 97.9 98.0  PLT 109* 081*   Basic Metabolic Panel: Recent Labs  Lab 04/02/20 1112 04/03/20 0659  NA 135 135  K 4.2 4.5  CL 92* 96*  CO2 28 24  GLUCOSE 185* 94  BUN 35* 43*  CREATININE 6.38* 7.19*  CALCIUM 9.2 8.1*   GFR: Estimated Creatinine Clearance: 4.5 mL/min (A) (by C-G formula based on SCr of 7.19 mg/dL (H)). Liver Function Tests: Recent Labs  Lab 04/02/20 1112  AST 19  ALT 24  ALKPHOS 89  BILITOT 0.8  PROT 7.2  ALBUMIN 3.9   Recent Labs  Lab 04/02/20 1112  LIPASE 39   No results for input(s): AMMONIA in the last 168 hours. Coagulation Profile: No results for input(s): INR, PROTIME in the last 168 hours. Cardiac Enzymes: No results for input(s): CKTOTAL, CKMB, CKMBINDEX, TROPONINI in the last 168 hours. BNP (last 3 results) No results for input(s): PROBNP in the last 8760 hours. HbA1C: Recent Labs    04/02/20 1758  HGBA1C 7.8*   CBG: Recent Labs  Lab 04/02/20 1347 04/03/20 0732  GLUCAP 200* 97   Lipid Profile: No results for input(s): CHOL, HDL, LDLCALC, TRIG, CHOLHDL, LDLDIRECT in the last 72 hours. Thyroid Function Tests: No results for input(s): TSH, T4TOTAL, FREET4, T3FREE, THYROIDAB in the last 72 hours. Anemia Panel: No results for input(s): VITAMINB12, FOLATE, FERRITIN, TIBC, IRON, RETICCTPCT in the last 72 hours. Sepsis Labs: Recent Labs  Lab 04/02/20 1308  LATICACIDVEN 1.1    Recent Results (from the past 240 hour(s))  Resp Panel by RT-PCR (Flu A&B, Covid) Nasopharyngeal Swab     Status: None   Collection Time: 04/02/20  2:25 PM   Specimen: Nasopharyngeal Swab; Nasopharyngeal(NP) swabs in vial transport medium  Result Value Ref Range Status   SARS Coronavirus 2 by RT PCR NEGATIVE NEGATIVE  Final    Comment: (NOTE) SARS-CoV-2 target nucleic acids are NOT DETECTED.  The SARS-CoV-2 RNA is generally detectable in upper respiratory specimens during the acute phase of infection. The lowest concentration of SARS-CoV-2 viral copies this assay can detect is 138 copies/mL. A negative result does not preclude SARS-Cov-2 infection and should not be used as the sole basis for treatment or other patient management decisions. A negative result may occur with  improper specimen collection/handling, submission of specimen other than nasopharyngeal swab, presence of viral mutation(s) within the  areas targeted by this assay, and inadequate number of viral copies(<138 copies/mL). A negative result must be combined with clinical observations, patient history, and epidemiological information. The expected result is Negative.  Fact Sheet for Patients:  EntrepreneurPulse.com.au  Fact Sheet for Healthcare Providers:  IncredibleEmployment.be  This test is no t yet approved or cleared by the [place] FDA and  has been authorized for detection and/or diagnosis of SARS-CoV-2 by FDA under an Emergency Use Authorization (EUA). This EUA will remain  in effect (meaning this test can be used) for the duration of the COVID-19 declaration under Section 564(b)(1) of the Act, 21 U.S.C.section 360bbb-3(b)(1), unless the authorization is terminated  or revoked sooner.       Influenza A by PCR NEGATIVE NEGATIVE Final   Influenza B by PCR NEGATIVE NEGATIVE Final    Comment: (NOTE) The Xpert Xpress SARS-CoV-2/FLU/RSV plus assay is intended as an aid in the diagnosis of influenza from Nasopharyngeal swab specimens and should not be used as a sole basis for treatment. Nasal washings and aspirates are unacceptable for Xpert Xpress SARS-CoV-2/FLU/RSV testing.  Fact Sheet for Patients: EntrepreneurPulse.com.au  Fact Sheet for Healthcare  Providers: IncredibleEmployment.be  This test is not yet approved or cleared by the [place] FDA and has been authorized for detection and/or diagnosis of SARS-CoV-2 by FDA under an Emergency Use Authorization (EUA). This EUA will remain in effect (meaning this test can be used) for the duration of the COVID-19 declaration under Section 564(b)(1) of the Act, 21 U.S.C. section 360bbb-3(b)(1), unless the authorization is terminated or revoked.  Performed at [facility], [address] [place], [place] [address]       Radiology Studies: CT ABDOMEN PELVIS WO CONTRAST  Result Date: 04/02/2020 CLINICAL DATA:  Left upper quadrant abdominal pain EXAM: CT ABDOMEN AND PELVIS WITHOUT CONTRAST TECHNIQUE: Multidetector CT imaging of the abdomen and pelvis was performed following the standard protocol without IV contrast. COMPARISON:  CT 05/01/2019 FINDINGS: Lower chest: Atelectatic changes are present in the lung bases including more bandlike areas of subsegmental atelectasis. Some interlobular septal thickening is noted as well which could reflect early edema. Cardiomegaly appearance. Tip of a dual lumen dialysis catheter is noted. Post sternotomy changes. Coronary artery calcifications. Hepatobiliary: No visible liver lesion on this unenhanced CT. Liver attenuation is within normal limits. Scattered calcifications in the liver are favored to be vascular in nature. Gallbladder wall appears thickened with some pericholecystic fluid though may be redistributed. Layering calcified gallstones and probable sludge present within the lumen. Pancreas: No pancreatic ductal dilatation or surrounding inflammatory changes. Spleen: Normal in size. No concerning splenic lesions. Linear branching calcifications are present. Adrenals/Urinary Tract: Normal adrenal glands. Kidneys are not symmetric in size and normally located. Extensive vascular calcifications in the renal hila. Few  subcentimeter hypoattenuating foci too small to fully characterize on CT imaging but statistically likely benign. No visible or contour deforming renal lesions. No urolithiasis or hydronephrosis is evident. Urinary bladder is largely decompressed at the time of exam and therefore poorly evaluated by CT imaging. Circumferential bladder wall thickening is nonspecific given underdistention. Stomach/Bowel: Distal esophagus, stomach and duodenum are unremarkable. No conspicuous small bowel thickening or dilatation. Portion of small bowel protrudes into a small infraumbilical ventral hernia anteriorly albeit without convincing evidence of mechanical obstruction or vascular compromise at this time. There is some diffuse mild pancolonic mural thickening. Scattered colonic diverticula without focal inflammation to suggest diverticulitis. Vascular/Lymphatic: Extensive atherosclerotic calcification throughout the abdominal aorta and branch vessels. No  aneurysm or ectasia. Limited evaluation the absence of contrast. No suspicious or enlarged lymph nodes in the included lymphatic chains. Reproductive: Uterus is surgically absent. No concerning adnexal lesions. Other: Small volume simple attenuation ascites. No free air. Diffuse body wall edema. Fat and bowel containing infraumbilical ventral hernia as above. No other bowel containing hernia. No retroperitoneal or body wall hematoma. Musculoskeletal: Remote fracture deformity of the left inferior pubic ramus. No acute osseous abnormality or suspicious osseous lesion. Musculature is unremarkable. IMPRESSION: 1. Cholelithiasis with gallbladder wall thickening and pericholecystic fluid though may be redistributed. If there is clinical concern for acute cholecystitis, consider further evaluation with right upper quadrant ultrasound. 2. Mild diffuse pancolonic mural thickening, nonspecific, but can be seen with colitis or possibly related to the abdominal ascites. 3. Fat and small  bowel containing infraumbilical ventral hernia anteriorly albeit without convincing evidence of mechanical obstruction or vascular compromise at this time. 4. Circumferential bladder wall thickening is nonspecific given underdistention. Recommend correlation with urinalysis to exclude cystitis. 5. Small volume simple attenuation ascites. Diffuse body wall edema. Correlate for features of anasarca. 6. Cardiomegaly with features of edema and basilar atelectasis. 7. Aortic Atherosclerosis (ICD10-I70.0). Electronically Signed   By: Lovena Le M.D.   On: 04/02/2020 15:19   DG Chest Portable 1 View  Result Date: 04/02/2020 CLINICAL DATA:  Left upper quadrant pain beginning 2 days prior radiating to left chest and back, history of CHF, CAD, diabetes EXAM: PORTABLE CHEST 1 VIEW COMPARISON:  Radiograph 11/03/2019, CT 09/02/2019 FINDINGS: Left IJ approach dual lumen dialysis catheter tip projects over the right atrium. Pacer pads and telemetry leads overlie the chest. Postsurgical changes related to prior CABG including intact and aligned sternotomy wires and multiple surgical clips projecting over the mediastinum. Cardiomegaly is similar to prior portable radiography. The aorta is calcified. The remaining cardiomediastinal contours are unremarkable. Mixed hazy and patchy opacities are present throughout both lungs with indistinct, congested pulmonary vascularity, fissural and septal thickening, and small bilateral effusions. Biapical pleuroparenchymal scarring. No pneumothorax. No acute osseous or soft tissue abnormality. Degenerative changes are present in the imaged spine and shoulders. IMPRESSION: 1. Findings compatible with CHF/volume overload with cardiomegaly and edema, effusions. 2. More coalescent airspace disease is likely to reflect alveolar edema though infection could present similarly. Electronically Signed   By: Lovena Le M.D.   On: 04/02/2020 15:11    Scheduled Meds: . apixaban  2.5 mg Oral BID   . aspirin EC  81 mg Oral Daily  . atorvastatin  80 mg Oral QHS  . brimonidine  1 drop Both Eyes TID  . carvedilol  25 mg Oral BID  . Chlorhexidine Gluconate Cloth  6 each Topical Q0600  . fluticasone  2 spray Each Nare Daily  . hydrALAZINE  50 mg Oral Q8H  . insulin aspart  0-6 Units Subcutaneous TID WC  . isosorbide mononitrate  30 mg Oral Daily  . [START ON 04/04/2020] levETIRAcetam  250 mg Oral Q T,Th,Sa-HD  . mirtazapine  7.5 mg Oral Daily  . Omega-3  2 capsule Oral Daily  . pantoprazole  40 mg Oral Daily  . Polyvinyl Alcohol-Povidone PF  1 drop Both Eyes BID  . rOPINIRole  0.25 mg Oral QHS  . sevelamer carbonate  1,600 mg Oral TID WC  . sodium chloride flush  3 mL Intravenous Q12H  . timolol  1 drop Both Eyes TID  . vitamin B-12  1,000 mcg Oral Daily   Continuous Infusions: . sodium chloride  LOS: 1 day   Time spent: 39 minutes   Darliss Cheney, MD [facility]  04/03/2020, 11:31 AM   To contact the attending provider between 7A-7P or the covering provider during after hours 7P-7A, please log into the web site [email or web address].

## 2020-04-03 NOTE — Progress Notes (Signed)
Hemodialysis patient known at [facility] TTS 11:00am. Please contact me with any dialysis placement concerns.  Elvera Bicker Dialysis Coordinator [phone]

## 2020-04-04 ENCOUNTER — Inpatient Hospital Stay: Payer: Medicare Other

## 2020-04-04 LAB — GLUCOSE, CAPILLARY
Glucose-Capillary: 179 mg/dL — ABNORMAL HIGH (ref 70–99)
Glucose-Capillary: 162 mg/dL — ABNORMAL HIGH (ref 70–99)
Glucose-Capillary: 166 mg/dL — ABNORMAL HIGH (ref 70–99)
Glucose-Capillary: 171 mg/dL — ABNORMAL HIGH (ref 70–99)

## 2020-04-04 LAB — MRSA PCR SCREENING: MRSA by PCR: NEGATIVE

## 2020-04-04 LAB — CBC
HCT: 39.8 % (ref 36.0–46.0)
Hemoglobin: 12 g/dL (ref 12.0–15.0)
MCH: 30.2 pg (ref 26.0–34.0)
MCHC: 30.2 g/dL (ref 30.0–36.0)
MCV: 100 fL (ref 80.0–100.0)
Platelets: 98 10*3/uL — ABNORMAL LOW (ref 150–400)
RBC: 3.98 MIL/uL (ref 3.87–5.11)
RDW: 16.8 % — ABNORMAL HIGH (ref 11.5–15.5)
WBC: 5.5 10*3/uL (ref 4.0–10.5)
nRBC: 0 % (ref 0.0–0.2)

## 2020-04-04 MED ORDER — ACYCLOVIR 800 MG PO TABS: 800.0000 mg | ORAL_TABLET | Freq: Every day | ORAL | Status: DC

## 2020-04-04 MED ORDER — ACYCLOVIR 200 MG PO CAPS
400.0000 mg | ORAL_CAPSULE | ORAL | Status: DC
  Filled 2020-04-04: qty 2

## 2020-04-04 MED ORDER — ACYCLOVIR 200 MG PO CAPS
200.0000 mg | ORAL_CAPSULE | Freq: Two times a day (BID) | ORAL | Status: DC
  Administered 2020-04-04: 200 mg via ORAL
  Filled 2020-04-04 (×2): qty 1

## 2020-04-04 MED ORDER — ACYCLOVIR 200 MG PO CAPS
400.0000 mg | ORAL_CAPSULE | Freq: Once | ORAL | Status: AC
  Administered 2020-04-04: 400 mg via ORAL
  Filled 2020-04-04: qty 2

## 2020-04-04 MED ORDER — DIPHENHYDRAMINE HCL 50 MG/ML IJ SOLN: 12.5000 mg | Freq: Four times a day (QID) | INTRAMUSCULAR | Status: DC | PRN

## 2020-04-04 MED ORDER — DIPHENHYDRAMINE-ZINC ACETATE 2-0.1 % EX CREA: TOPICAL_CREAM | Freq: Two times a day (BID) | CUTANEOUS | Status: DC | PRN

## 2020-04-04 NOTE — Progress Notes (Signed)
PROGRESS NOTE    Stephanie Ellis  SJG:[id number] DOB: [birth year] DOA: 04/02/2020 PCP: Kirk Ruths, MD   Brief Narrative:  Stephanie Ellis is a 69 y.o. female with medical history significant for end-stage renal disease on hemodialysis, dialysis days are M/W/F, history of peripheral arterial disease status post bilateral BKA, diabetes mellitus and coronary artery disease who presented to the emergency room for evaluation of chest pain mostly over the anterior/lateral chest wall which she has had for 2 days.  Pain has been constant and not related to rest or exertion.   While in the emergency room she received a dose of hydromorphone 0.5 mg IV and suddenly became unresponsive, confused with sonorous respiration.  She was unresponsive to painful or verbal stimuli.  Patient went into respiratory arrest, she became bradycardic and was noted to have PEA.  ACLS protocol was initiated and chest compressions were started.  Patient received 2 doses of Narcan with improvement in her mental status and improvement in her heart rate as well.  She is back to her baseline mental status.  CT scan of abdomen and pelvis shows cholelithiasis with gallbladder wall thickening and pericholecystic fluid to may be redistributed.  Mild diffuse pancolonic mural thickening, nonspecific.  Fat and small bowel containing infraumbilical ventral hernia without convincing evidence of mechanical obstruction at this time.  Cardiomegaly with features of edema and bibasilar atelectasis. Chest x-ray shows findings reviewed by me shows CHF/volume overload with cardiomegaly and edema.  Bilateral pleural effusions  Assessment & Plan:   Principal Problem:   Chest pain Active Problems:   Elevated troponin   S/P CABG x 3   ESRD (end stage renal disease) on dialysis (HCC)   Type II diabetes mellitus with complication (HCC)   Coronary artery disease involving coronary bypass graft of native heart with angina pectoris (HCC)    Seizure disorder (HCC)   Hypertensive urgency   Chronic respiratory failure with hypoxia (HCC)   Chronic combined systolic and diastolic CHF (congestive heart failure) (HCC)   Thrombocytopenia ([place])   Chest pain/shingles: Patient still complains of chest pain.  On examination, she has couple of scattered vesicles but limited to the dermatome under her left breast.  These findings are suggestive of shingles.  We will start this patient on acyclovir.  Pharmacy consulted.  Troponins slightly elevated but flat.  Not indicative of ACS.  History of CAD status post CABG:  Continue aspirin, apixaban, statins and beta-blockers.  Cardiology on board.  ESRD on HD: Nephrology on board.  She gets dialysis on MWF.  Hypertensive urgency: Presented with systolic blood pressure over 654 and diastolic over 650.  Blood pressure has improved now.  Continue all home medications which include carvedilol, hydralazine and Imdur and as needed clonidine.  Diabetes mellitus type II: She seems to be taking Lantus only 5 units at home.  Currently hemoglobin A1c 7.8.  Blood sugar controlled.  Continue SSI.  Acute on chronic combined systolic and diastolic dysfunction CHF Patient's last known LVEF is about 35 to 40% from an echocardiogram done in 2019 Chest x-ray shows findings suggestive of CHF but patient is asymptomatic at this time and is not short of breath or hypoxic.  Hopefully dialysis will continue to improve her pulmonary edema  Seizure disorder Continue Keppra Place patient on seizure precautions  Chronic Thrombocytopenia: At baseline.  No signs of bleeding.  Monitor.  Peripheral arterial disease Status post bilateral BKA Continue statin, apixaban and carvedilol  Respiratory arrest: Following administration of opiates for pain  in the emergency room Patient had received a dose of hydromorphone and became unresponsive and went into PEA She received 2 doses of Narcan in the ER as well as chest  compressions We will avoid opioids during this hospitalization.  Patient alert and oriented and without any shortness of breath or hypoxia.  Cholelithiasis: CT abdomen was concerning about possible cholecystitis.  Ultrasound abdomen negative for cholecystitis.  Exam not consistent with cholecystitis as well.  No leukocytosis and she is afebrile.  Tolerating diet.  No indication to pursue further HIDA scan.  Intermittent chronic diarrhea/possible colitis: Patient tells me that she has diarrhea now but she also tells me that she has intermittent diarrhea for several years however her CT abdomen and pelvis shows possibility of colitis so C. difficile and GI pathogen panel ordered yesterday however she has not had any BM since yesterday.  Keep on isolation for now.  Will treat only when evidence of positive infection.   DVT prophylaxis: apixaban (ELIQUIS) tablet 2.5 mg Start: 04/02/20 2200   Code Status: Full Code  Family Communication: None present at bedside.  Plan of care discussed with patient in length and he verbalized understanding and agreed with it.  Status is: Inpatient  Remains inpatient appropriate because:Hemodynamically unstable and Ongoing diagnostic testing needed not appropriate for outpatient work up   Dispo: The patient is from: Home              Anticipated d/c is to: Home              Anticipated d/c date is: 1 to 2 days              Patient currently is not medically stable to d/c.        Estimated body mass index is 28.81 kg/m as calculated from the following:   Height as of 11/04/19: 4' 4"  (1.321 m).   Weight as of this encounter: 50.3 kg.      Nutritional status:               Consultants:   Cardiology  Nephrology  Procedures:   None  Antimicrobials:  Anti-infectives (From admission, onward)   Start     Dose/Rate Route Frequency Ordered Stop   04/05/20 1200  acyclovir (ZOVIRAX) 200 MG capsule 400 mg       "And" Linked Group Details    400 mg Oral Every M-W-F (Hemodialysis) 04/04/20 1142     04/04/20 2200  acyclovir (ZOVIRAX) 200 MG capsule 200 mg       "And" Linked Group Details   200 mg Oral Every 12 hours 04/04/20 1142     04/04/20 1230  acyclovir (ZOVIRAX) 200 MG capsule 400 mg        400 mg Oral  Once 04/04/20 1142 04/04/20 1229   04/04/20 1145  acyclovir (ZOVIRAX) tablet 800 mg  Status:  Discontinued        800 mg Oral 5 times daily 04/04/20 1056 04/04/20 1102         Subjective: Patient seen and examined.  She still complains of left lower rib cage anterior chest pain.  No other complaint.  No nausea no vomiting no fever.  Objective: Vitals:   04/04/20 0343 04/04/20 0630 04/04/20 0748 04/04/20 1154  BP: (!) 164/61 (!) 161/71 (!) 168/64 (!) 169/64  Pulse: (!) 52  (!) 57 (!) 58  Resp: 18  17 16   Temp: 98.3 F (36.8 C)  97.6 F (36.4 C) 98.4 F (36.9 C)  TempSrc: Oral  Oral Oral  SpO2: 100%  100% 100%  Weight: 50.3 kg       Intake/Output Summary (Last 24 hours) at 04/04/2020 1352 Last data filed at 04/04/2020 0345 Gross per 24 hour  Intake 0 ml  Output 0 ml  Net 0 ml   Filed Weights   04/02/20 1742 04/04/20 0343  Weight: 55.8 kg 50.3 kg    Examination:  General exam: Appears calm and comfortable but chronically sick looking and cachectic Respiratory system: Clear to auscultation. Respiratory effort normal. Cardiovascular system: S1 & S2 heard, RRR. No JVD, murmurs, rubs, gallops or clicks. No pedal edema. Gastrointestinal system: Abdomen is nondistended, soft and nontender. No organomegaly or masses felt. Normal bowel sounds heard. Central nervous system: Alert and oriented. No focal neurological deficits. Extremities: Symmetric 5 x 5 power. Skin: Scattered vesicles limited to the dermatome under her left breast. Psychiatry: Judgement and insight appear normal. Mood & affect appropriate.   Data Reviewed: I have personally reviewed following labs and imaging studies  CBC: Recent Labs   Lab 04/02/20 1112 04/03/20 0659 04/04/20 0459  WBC 4.3 4.7 5.5  HGB 12.8 12.0 12.0  HCT 42.2 39.0 39.8  MCV 97.9 98.0 100.0  PLT 109* 104* 98*   Basic Metabolic Panel: Recent Labs  Lab 04/02/20 1112 04/03/20 0659  NA 135 135  K 4.2 4.5  CL 92* 96*  CO2 28 24  GLUCOSE 185* 94  BUN 35* 43*  CREATININE 6.38* 7.19*  CALCIUM 9.2 8.1*   GFR: Estimated Creatinine Clearance: 4.2 mL/min (A) (by C-G formula based on SCr of 7.19 mg/dL (H)). Liver Function Tests: Recent Labs  Lab 04/02/20 1112  AST 19  ALT 24  ALKPHOS 89  BILITOT 0.8  PROT 7.2  ALBUMIN 3.9   Recent Labs  Lab 04/02/20 1112  LIPASE 39   No results for input(s): AMMONIA in the last 168 hours. Coagulation Profile: No results for input(s): INR, PROTIME in the last 168 hours. Cardiac Enzymes: No results for input(s): CKTOTAL, CKMB, CKMBINDEX, TROPONINI in the last 168 hours. BNP (last 3 results) No results for input(s): PROBNP in the last 8760 hours. HbA1C: Recent Labs    04/02/20 1758  HGBA1C 7.8*   CBG: Recent Labs  Lab 04/03/20 0732 04/03/20 1701 04/03/20 2321 04/04/20 0748 04/04/20 1154  GLUCAP 97 146* 260* 179* 162*   Lipid Profile: No results for input(s): CHOL, HDL, LDLCALC, TRIG, CHOLHDL, LDLDIRECT in the last 72 hours. Thyroid Function Tests: No results for input(s): TSH, T4TOTAL, FREET4, T3FREE, THYROIDAB in the last 72 hours. Anemia Panel: No results for input(s): VITAMINB12, FOLATE, FERRITIN, TIBC, IRON, RETICCTPCT in the last 72 hours. Sepsis Labs: Recent Labs  Lab 04/02/20 1308  LATICACIDVEN 1.1    Recent Results (from the past 240 hour(s))  Resp Panel by RT-PCR (Flu A&B, Covid) Nasopharyngeal Swab     Status: None   Collection Time: 04/02/20  2:25 PM   Specimen: Nasopharyngeal Swab; Nasopharyngeal(NP) swabs in vial transport medium  Result Value Ref Range Status   SARS Coronavirus 2 by RT PCR NEGATIVE NEGATIVE Final    Comment: (NOTE) SARS-CoV-2 target nucleic acids  are NOT DETECTED.  The SARS-CoV-2 RNA is generally detectable in upper respiratory specimens during the acute phase of infection. The lowest concentration of SARS-CoV-2 viral copies this assay can detect is 138 copies/mL. A negative result does not preclude SARS-Cov-2 infection and should not be used as the sole basis for treatment or other patient management decisions. A  negative result may occur with  improper specimen collection/handling, submission of specimen other than nasopharyngeal swab, presence of viral mutation(s) within the areas targeted by this assay, and inadequate number of viral copies(<138 copies/mL). A negative result must be combined with clinical observations, patient history, and epidemiological information. The expected result is Negative.  Fact Sheet for Patients:  EntrepreneurPulse.com.au  Fact Sheet for Healthcare Providers:  IncredibleEmployment.be  This test is no t yet approved or cleared by the [place] FDA and  has been authorized for detection and/or diagnosis of SARS-CoV-2 by FDA under an Emergency Use Authorization (EUA). This EUA will remain  in effect (meaning this test can be used) for the duration of the COVID-19 declaration under Section 564(b)(1) of the Act, 21 U.S.C.section 360bbb-3(b)(1), unless the authorization is terminated  or revoked sooner.       Influenza A by PCR NEGATIVE NEGATIVE Final   Influenza B by PCR NEGATIVE NEGATIVE Final    Comment: (NOTE) The Xpert Xpress SARS-CoV-2/FLU/RSV plus assay is intended as an aid in the diagnosis of influenza from Nasopharyngeal swab specimens and should not be used as a sole basis for treatment. Nasal washings and aspirates are unacceptable for Xpert Xpress SARS-CoV-2/FLU/RSV testing.  Fact Sheet for Patients: EntrepreneurPulse.com.au  Fact Sheet for Healthcare Providers: IncredibleEmployment.be  This test is  not yet approved or cleared by the [place] FDA and has been authorized for detection and/or diagnosis of SARS-CoV-2 by FDA under an Emergency Use Authorization (EUA). This EUA will remain in effect (meaning this test can be used) for the duration of the COVID-19 declaration under Section 564(b)(1) of the Act, 21 U.S.C. section 360bbb-3(b)(1), unless the authorization is terminated or revoked.  Performed at [facility], [address] [place], [place] [address]   MRSA PCR Screening     Status: None   Collection Time: 04/04/20  6:00 AM   Specimen: Nasopharyngeal  Result Value Ref Range Status   MRSA by PCR NEGATIVE NEGATIVE Final    Comment:        The GeneXpert MRSA Assay (FDA approved for NASAL specimens only), is one component of a comprehensive MRSA colonization surveillance program. It is not intended to diagnose MRSA infection nor to guide or monitor treatment for MRSA infections. Performed at [facility], [address] [place], [place] [address]       Radiology Studies: CT ABDOMEN PELVIS WO CONTRAST  Result Date: 04/02/2020 CLINICAL DATA:  Left upper quadrant abdominal pain EXAM: CT ABDOMEN AND PELVIS WITHOUT CONTRAST TECHNIQUE: Multidetector CT imaging of the abdomen and pelvis was performed following the standard protocol without IV contrast. COMPARISON:  CT 05/01/2019 FINDINGS: Lower chest: Atelectatic changes are present in the lung bases including more bandlike areas of subsegmental atelectasis. Some interlobular septal thickening is noted as well which could reflect early edema. Cardiomegaly appearance. Tip of a dual lumen dialysis catheter is noted. Post sternotomy changes. Coronary artery calcifications. Hepatobiliary: No visible liver lesion on this unenhanced CT. Liver attenuation is within normal limits. Scattered calcifications in the liver are favored to be vascular in nature. Gallbladder wall appears thickened with some  pericholecystic fluid though may be redistributed. Layering calcified gallstones and probable sludge present within the lumen. Pancreas: No pancreatic ductal dilatation or surrounding inflammatory changes. Spleen: Normal in size. No concerning splenic lesions. Linear branching calcifications are present. Adrenals/Urinary Tract: Normal adrenal glands. Kidneys are not symmetric in size and normally located. Extensive vascular calcifications in the renal hila. Few subcentimeter hypoattenuating foci  too small to fully characterize on CT imaging but statistically likely benign. No visible or contour deforming renal lesions. No urolithiasis or hydronephrosis is evident. Urinary bladder is largely decompressed at the time of exam and therefore poorly evaluated by CT imaging. Circumferential bladder wall thickening is nonspecific given underdistention. Stomach/Bowel: Distal esophagus, stomach and duodenum are unremarkable. No conspicuous small bowel thickening or dilatation. Portion of small bowel protrudes into a small infraumbilical ventral hernia anteriorly albeit without convincing evidence of mechanical obstruction or vascular compromise at this time. There is some diffuse mild pancolonic mural thickening. Scattered colonic diverticula without focal inflammation to suggest diverticulitis. Vascular/Lymphatic: Extensive atherosclerotic calcification throughout the abdominal aorta and branch vessels. No aneurysm or ectasia. Limited evaluation the absence of contrast. No suspicious or enlarged lymph nodes in the included lymphatic chains. Reproductive: Uterus is surgically absent. No concerning adnexal lesions. Other: Small volume simple attenuation ascites. No free air. Diffuse body wall edema. Fat and bowel containing infraumbilical ventral hernia as above. No other bowel containing hernia. No retroperitoneal or body wall hematoma. Musculoskeletal: Remote fracture deformity of the left inferior pubic ramus. No acute  osseous abnormality or suspicious osseous lesion. Musculature is unremarkable. IMPRESSION: 1. Cholelithiasis with gallbladder wall thickening and pericholecystic fluid though may be redistributed. If there is clinical concern for acute cholecystitis, consider further evaluation with right upper quadrant ultrasound. 2. Mild diffuse pancolonic mural thickening, nonspecific, but can be seen with colitis or possibly related to the abdominal ascites. 3. Fat and small bowel containing infraumbilical ventral hernia anteriorly albeit without convincing evidence of mechanical obstruction or vascular compromise at this time. 4. Circumferential bladder wall thickening is nonspecific given underdistention. Recommend correlation with urinalysis to exclude cystitis. 5. Small volume simple attenuation ascites. Diffuse body wall edema. Correlate for features of anasarca. 6. Cardiomegaly with features of edema and basilar atelectasis. 7. Aortic Atherosclerosis (ICD10-I70.0). Electronically Signed   By: Lovena Le M.D.   On: 04/02/2020 15:19   DG Chest Portable 1 View  Result Date: 04/02/2020 CLINICAL DATA:  Left upper quadrant pain beginning 2 days prior radiating to left chest and back, history of CHF, CAD, diabetes EXAM: PORTABLE CHEST 1 VIEW COMPARISON:  Radiograph 11/03/2019, CT 09/02/2019 FINDINGS: Left IJ approach dual lumen dialysis catheter tip projects over the right atrium. Pacer pads and telemetry leads overlie the chest. Postsurgical changes related to prior CABG including intact and aligned sternotomy wires and multiple surgical clips projecting over the mediastinum. Cardiomegaly is similar to prior portable radiography. The aorta is calcified. The remaining cardiomediastinal contours are unremarkable. Mixed hazy and patchy opacities are present throughout both lungs with indistinct, congested pulmonary vascularity, fissural and septal thickening, and small bilateral effusions. Biapical pleuroparenchymal  scarring. No pneumothorax. No acute osseous or soft tissue abnormality. Degenerative changes are present in the imaged spine and shoulders. IMPRESSION: 1. Findings compatible with CHF/volume overload with cardiomegaly and edema, effusions. 2. More coalescent airspace disease is likely to reflect alveolar edema though infection could present similarly. Electronically Signed   By: Lovena Le M.D.   On: 04/02/2020 15:11   US Abdomen Limited RUQ (LIVER/GB)  Result Date: 04/04/2020 CLINICAL DATA:  69 year old female with possible cholecystitis. EXAM: ULTRASOUND ABDOMEN LIMITED RIGHT UPPER QUADRANT COMPARISON:  CT abdomen pelvis from 04/02/2020 and right upper quadrant ultrasounds from 05/01/2019 and 09/04/2019. FINDINGS: Gallbladder: The gallbladder is relatively decompressed with thickening of the gallbladder wall measuring up to 2.5 mm, significantly decreased from 09/04/2019 comparison. Layering sludge and a few small layering gallstones are again  seen. Trace pericholecystic/perihepatic free fluid. Absent sonographic Murphy sign. Common bile duct: Diameter: 3.4 mm Liver: No focal lesion identified. Within normal limits in parenchymal echogenicity and echotexture. Portal vein is patent on color Doppler imaging with normal direction of blood flow towards the liver. Other: No perihepatic ascites. IMPRESSION: No convincing sonographic signs of acute cholecystitis. Chronic gallbladder wall thickening up to 2.5 mm, decreased from May 2021 comparison, most likely secondary to congestive heart failure although chronic cholecystitis is plausible. Similar appearing layering sludge and gallstones in a nondistended gallbladder. If clinical concern for acute cholecystitis persists, recommend HIDA scan for further evaluation. Ruthann Cancer, MD [facility] Radiology Electronically Signed   By: Ruthann Cancer MD   On: 04/04/2020 08:30    Scheduled Meds: . Derrill Memo ON 04/05/2020]  acyclovir  400 mg Oral Q M,W,F-HD   And  . acyclovir  200 mg Oral Q12H  . apixaban  2.5 mg Oral BID  . aspirin EC  81 mg Oral Daily  . atorvastatin  80 mg Oral QHS  . brimonidine  1 drop Both Eyes TID  . carvedilol  25 mg Oral BID  . Chlorhexidine Gluconate Cloth  6 each Topical Q0600  . fluticasone  2 spray Each Nare Daily  . hydrALAZINE  50 mg Oral Q8H  . insulin aspart  0-6 Units Subcutaneous TID WC  . isosorbide mononitrate  30 mg Oral Daily  . levETIRAcetam  250 mg Oral Q T,Th,Sa-HD  . mirtazapine  7.5 mg Oral Daily  . omega-3 acid ethyl esters  1 capsule Oral Daily  . pantoprazole  40 mg Oral Daily  . polyvinyl alcohol  1 drop Both Eyes BID  . rOPINIRole  0.25 mg Oral QHS  . sevelamer carbonate  1,600 mg Oral TID WC  . sodium chloride flush  3 mL Intravenous Q12H  . timolol  1 drop Both Eyes TID  . vitamin B-12  1,000 mcg Oral Daily   Continuous Infusions: . sodium chloride       LOS: 2 days   Time spent: 30 minutes   Darliss Cheney, MD [facility]  04/04/2020, 1:52 PM   To contact the attending provider between 7A-7P or the covering provider during after hours 7P-7A, please log into the web site [email or web address].

## 2020-04-04 NOTE — Plan of Care (Signed)

## 2020-04-04 NOTE — Progress Notes (Signed)
[facility] Cardiology    SUBJECTIVE: Complains of mild weakness denies any worsening chest pain still has some diarrhea no fever chills or sweats   Vitals:   04/04/20 0215 04/04/20 0230 04/04/20 0343 04/04/20 0630  BP:   (!) 164/61 (!) 161/71  Pulse: 61 60 (!) 52   Resp:   18   Temp:   98.3 F (36.8 C)   TempSrc:   Oral   SpO2: 100% 100% 100%   Weight:   50.3 kg      Intake/Output Summary (Last 24 hours) at 04/04/2020 0715 Last data filed at 04/04/2020 0345 Gross per 24 hour  Intake 0 ml  Output 1500 ml  Net -1500 ml      PHYSICAL EXAM  General: Well developed, well nourished, in no acute distress HEENT:  Normocephalic and atramatic Neck:  No JVD.  Lungs: Clear bilaterally to auscultation and percussion. Heart: HRRR . Normal S1 and S2 without gallops or murmurs.  Abdomen: Bowel sounds are positive, abdomen soft and non-tender  Msk:  Back normal, normal gait. Normal strength and tone for age. Extremities: No clubbing, cyanosis or edema.  Bilateral BKA Neuro: Alert and oriented X 3. Psych:  Good affect, responds appropriately   LABS: Basic Metabolic Panel: Recent Labs    04/02/20 1112 04/03/20 0659  NA 135 135  K 4.2 4.5  CL 92* 96*  CO2 28 24  GLUCOSE 185* 94  BUN 35* 43*  CREATININE 6.38* 7.19*  CALCIUM 9.2 8.1*   Liver Function Tests: Recent Labs    04/02/20 1112  AST 19  ALT 24  ALKPHOS 89  BILITOT 0.8  PROT 7.2  ALBUMIN 3.9   Recent Labs    04/02/20 1112  LIPASE 39   CBC: Recent Labs    04/03/20 0659 04/04/20 0459  WBC 4.7 5.5  HGB 12.0 12.0  HCT 39.0 39.8  MCV 98.0 100.0  PLT 104* 98*   Cardiac Enzymes: No results for input(s): CKTOTAL, CKMB, CKMBINDEX, TROPONINI in the last 72 hours. BNP: Invalid input(s): POCBNP D-Dimer: No results for input(s): DDIMER in the last 72 hours. Hemoglobin A1C: Recent Labs    04/02/20 1758  HGBA1C 7.8*   Fasting Lipid Panel: No results for input(s): CHOL, HDL, LDLCALC, TRIG, CHOLHDL,  LDLDIRECT in the last 72 hours. Thyroid Function Tests: No results for input(s): TSH, T4TOTAL, T3FREE, THYROIDAB in the last 72 hours.  Invalid input(s): FREET3 Anemia Panel: No results for input(s): VITAMINB12, FOLATE, FERRITIN, TIBC, IRON, RETICCTPCT in the last 72 hours.  CT ABDOMEN PELVIS WO CONTRAST  Result Date: 04/02/2020 CLINICAL DATA:  Left upper quadrant abdominal pain EXAM: CT ABDOMEN AND PELVIS WITHOUT CONTRAST TECHNIQUE: Multidetector CT imaging of the abdomen and pelvis was performed following the standard protocol without IV contrast. COMPARISON:  CT 05/01/2019 FINDINGS: Lower chest: Atelectatic changes are present in the lung bases including more bandlike areas of subsegmental atelectasis. Some interlobular septal thickening is noted as well which could reflect early edema. Cardiomegaly appearance. Tip of a dual lumen dialysis catheter is noted. Post sternotomy changes. Coronary artery calcifications. Hepatobiliary: No visible liver lesion on this unenhanced CT. Liver attenuation is within normal limits. Scattered calcifications in the liver are favored to be vascular in nature. Gallbladder wall appears thickened with some pericholecystic fluid though may be redistributed. Layering calcified gallstones and probable sludge present within the lumen. Pancreas: No pancreatic ductal dilatation or surrounding inflammatory changes. Spleen: Normal in size. No concerning splenic lesions. Linear branching calcifications are present. Adrenals/Urinary Tract:  Normal adrenal glands. Kidneys are not symmetric in size and normally located. Extensive vascular calcifications in the renal hila. Few subcentimeter hypoattenuating foci too small to fully characterize on CT imaging but statistically likely benign. No visible or contour deforming renal lesions. No urolithiasis or hydronephrosis is evident. Urinary bladder is largely decompressed at the time of exam and therefore poorly evaluated by CT imaging.  Circumferential bladder wall thickening is nonspecific given underdistention. Stomach/Bowel: Distal esophagus, stomach and duodenum are unremarkable. No conspicuous small bowel thickening or dilatation. Portion of small bowel protrudes into a small infraumbilical ventral hernia anteriorly albeit without convincing evidence of mechanical obstruction or vascular compromise at this time. There is some diffuse mild pancolonic mural thickening. Scattered colonic diverticula without focal inflammation to suggest diverticulitis. Vascular/Lymphatic: Extensive atherosclerotic calcification throughout the abdominal aorta and branch vessels. No aneurysm or ectasia. Limited evaluation the absence of contrast. No suspicious or enlarged lymph nodes in the included lymphatic chains. Reproductive: Uterus is surgically absent. No concerning adnexal lesions. Other: Small volume simple attenuation ascites. No free air. Diffuse body wall edema. Fat and bowel containing infraumbilical ventral hernia as above. No other bowel containing hernia. No retroperitoneal or body wall hematoma. Musculoskeletal: Remote fracture deformity of the left inferior pubic ramus. No acute osseous abnormality or suspicious osseous lesion. Musculature is unremarkable. IMPRESSION: 1. Cholelithiasis with gallbladder wall thickening and pericholecystic fluid though may be redistributed. If there is clinical concern for acute cholecystitis, consider further evaluation with right upper quadrant ultrasound. 2. Mild diffuse pancolonic mural thickening, nonspecific, but can be seen with colitis or possibly related to the abdominal ascites. 3. Fat and small bowel containing infraumbilical ventral hernia anteriorly albeit without convincing evidence of mechanical obstruction or vascular compromise at this time. 4. Circumferential bladder wall thickening is nonspecific given underdistention. Recommend correlation with urinalysis to exclude cystitis. 5. Small volume  simple attenuation ascites. Diffuse body wall edema. Correlate for features of anasarca. 6. Cardiomegaly with features of edema and basilar atelectasis. 7. Aortic Atherosclerosis (ICD10-I70.0). Electronically Signed   By: Lovena Le M.D.   On: 04/02/2020 15:19   DG Chest Portable 1 View  Result Date: 04/02/2020 CLINICAL DATA:  Left upper quadrant pain beginning 2 days prior radiating to left chest and back, history of CHF, CAD, diabetes EXAM: PORTABLE CHEST 1 VIEW COMPARISON:  Radiograph 11/03/2019, CT 09/02/2019 FINDINGS: Left IJ approach dual lumen dialysis catheter tip projects over the right atrium. Pacer pads and telemetry leads overlie the chest. Postsurgical changes related to prior CABG including intact and aligned sternotomy wires and multiple surgical clips projecting over the mediastinum. Cardiomegaly is similar to prior portable radiography. The aorta is calcified. The remaining cardiomediastinal contours are unremarkable. Mixed hazy and patchy opacities are present throughout both lungs with indistinct, congested pulmonary vascularity, fissural and septal thickening, and small bilateral effusions. Biapical pleuroparenchymal scarring. No pneumothorax. No acute osseous or soft tissue abnormality. Degenerative changes are present in the imaged spine and shoulders. IMPRESSION: 1. Findings compatible with CHF/volume overload with cardiomegaly and edema, effusions. 2. More coalescent airspace disease is likely to reflect alveolar edema though infection could present similarly. Electronically Signed   By: Lovena Le M.D.   On: 04/02/2020 15:11     Echo  pending  TELEMETRY: Normal sinus rhythm nonspecific ST-T changes  ASSESSMENT AND PLAN:  Principal Problem:   Chest pain Active Problems:   Elevated troponin   S/P CABG x 3   ESRD (end stage renal disease) on dialysis (HCC)   Type II  diabetes mellitus with complication ([place])   Coronary artery disease involving coronary bypass graft of  native heart with angina pectoris (HCC)   Seizure disorder ([place])   Hypertensive urgency   Chronic respiratory failure with hypoxia (HCC)   Chronic combined systolic and diastolic CHF (congestive heart failure) ([place])   Thrombocytopenia ([place])    Plan Agree with admission to telemetry continue EKGs and troponins Continue diabetes management and control Agree with nephrology input for dialysis Chest pain improve do not suspect ACS Agree with work-up of diarrhea including C. difficile stool cultures Continue aggressive hypertension management and control Maintain seizure therapy Recommend conservative cardiac input at this point   Yolonda Kida, MD 04/04/2020 7:15 AM

## 2020-04-04 NOTE — Evaluation (Signed)
Occupational Therapy Evaluation Patient Details Name: Stephanie Ellis MRN: [id number] DOB: 1951/02/19 Today's Date: 04/04/2020    History of Present Illness 69 y.o. female with medical history significant for end-stage renal disease on hemodialysis, dialysis days are M/W/F, history of peripheral arterial disease status post bilateral BKA, diabetes mellitus and coronary artery disease who presented to the emergency room for evaluation of chest pain mostly over the anterior/lateral chest wall which she has had for 2 days.  Pain has been constant and not related to rest or exertion.   Clinical Impression   Upon entering the room, pt supine in bed with no c/o pain. Pt does endorse feeling very fatigued but agreeable to OT intervention. Pt reports living at home with husband. Pt has ramp to mobile home and reports performing self care tasks from wheelchair level at sink with lateral leans to pull clothing over buttocks. Pt verbalized using public transportation to dialysis while in wheelchair. Pt performing bed mobility independently and setting up wheelchair and transferred self into wheelchair and back to bed with supervision and without assistance from therapist. Pt is at baseline and agrees. All needs within reach. No skilled acute OT needs at this time. OT will SIGN OFF.     Follow Up Recommendations  No OT follow up;Supervision - Intermittent    Equipment Recommendations  None recommended by OT       Precautions / Restrictions Precautions Precautions: Fall      Mobility Bed Mobility Overal bed mobility: Modified Independent                  Transfers Overall transfer level: Modified independent Equipment used:  (wheelchair)             General transfer comment: Pt transfers self with lateral scoot transfer into wheelchair with supervision this session. Pt set up wheelchair herself.    Balance Overall balance assessment: Modified Independent                                          ADL either performed or assessed with clinical judgement   ADL Overall ADL's : At baseline                                       General ADL Comments: mod I -supervision overall. 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)              Pertinent Vitals/Pain Pain Assessment: No/denies pain     Hand Dominance Right   Extremity/Trunk Assessment Upper Extremity Assessment Upper Extremity Assessment: Overall WFL for tasks assessed   Lower Extremity Assessment Lower Extremity Assessment: Overall WFL for tasks assessed       Communication Communication Communication: No difficulties   Cognition Arousal/Alertness: Awake/alert Behavior During Therapy: WFL for tasks assessed/performed Overall Cognitive Status: Within Functional Limits for tasks assessed                                                Home Living Family/patient expects  to be discharged to:: Private residence Living Arrangements: Spouse/significant other Available Help at Discharge: Family;Available 24 hours/day Type of Home: House Home Access: Ramped entrance     Home Layout: One level         Bathroom Toilet: Standard     Home Equipment: Wheelchair - manual   Additional Comments: Pt lives in mobile home with her husband      Prior Functioning/Environment Level of Independence: Independent with assistive device(s)        Comments: Mod I - supervision level with all aspects of care. Pt does not use prosthetics and only "practice with them" and reports not being seen by therapy at this time                 OT Goals(Current goals can be found in the care plan section) Acute Rehab OT Goals Patient Stated Goal: to go home OT Goal Formulation: With patient  OT Frequency:       AM-PAC OT "6 Clicks" Daily Activity     Outcome Measure Help from another person eating meals?: None Help from another person taking care of personal grooming?: None Help from another person toileting, which includes using toliet, bedpan, or urinal?: A Little Help from another person bathing (including washing, rinsing, drying)?: A Little Help from another person to put on and taking off regular upper body clothing?: None Help from another person to put on and taking off regular lower body clothing?: A Little 6 Click Score: 21   End of Session Equipment Utilized During Treatment: (P) Other (comment);Oxygen (wheelchair) Nurse Communication: Mobility status  Activity Tolerance: Patient tolerated treatment well Patient left: in bed;with call bell/phone within reach;with bed alarm set                   Time: 1339-1359 OT Time Calculation (min): 20 min Charges:  OT General Charges $OT Visit: 1 Visit OT Evaluation $OT Eval Low Complexity: 1 Low OT Treatments $Self Care/Home Management : 8-22 mins  Darleen Crocker, MS, OTR/L , CBIS ascom [phone]  04/04/20, 3:58 PM

## 2020-04-04 NOTE — Consult Note (Addendum)
Pharmacy Antibiotic Note  Stephanie Ellis is a 69 y.o. female admitted on 04/02/2020 with herpes zoster. PMH includes diabetes, end stage renal disease on hemodialysis MWF, history of peripheral artery disease s/p bilateral BKA, and coronary artery disease. Pt presented with chest pain. Pt is immunocompetent. Most recent HD on 12/13. Pharmacy has been consulted for acyclovir dosing.  Plan: Acyclovir 400 mg x 1 dose, followed by 200 mg every 12 hours and an additional 400 mg every MWF with hemodialysis. Standard duration of therapy 5-7 days.  Thank you for allowing pharmacy to be a part of this patient's care.  Benn Moulder, PharmD Pharmacy Resident  04/04/2020 12:24 PM

## 2020-04-04 NOTE — Progress Notes (Signed)
[facility]  ROUNDING NOTE   Subjective:   [name] was admitted to [facility] on 04/02/2020 for Acute cholecystitis [K81.0] Respiratory arrest ([place]) [R09.2] LUQ abdominal pain [R10.12] Chest pain [R07.9] Biliary calculus of other site without obstruction [K80.80]  Received dialysis treatment yesterday, tolerated well.  She denies worsening shortness of breath, nausea or vomiting.  She complained of pain under her left breast radiating to her back.   Objective:  Vital signs in last 24 hours:  Temp:  [97.6 F (36.4 C)-98.4 F (36.9 C)] 98.4 F (36.9 C) (12/14 1154) Pulse Rate:  [52-68] 58 (12/14 1154) Resp:  [7-21] 16 (12/14 1154) BP: (161-169)/(61-109) 169/64 (12/14 1154) SpO2:  [100 %] 100 % (12/14 1154) Weight:  [50.3 kg] 50.3 kg (12/14 0343)  Weight change: -5.534 kg Filed Weights   04/02/20 1742 04/04/20 0343  Weight: 55.8 kg 50.3 kg    Intake/Output: I/O last 3 completed shifts: In: 3 [I.V.:3] Out: 1500 [Other:1500]   Intake/Output this shift:  No intake/output data recorded.  Physical Exam: General:  Resting in bed, in no acute distress  Head: Moist oral mucosal membranes  Eyes:  Sclera hent conjunctivae clear  Lungs:   Respirations symmetrical, unlabored, lungs clear  Heart: Regular rate and rhythm  Abdomen:  Soft, nontender, nondistended  Extremities:  bilateral BKA  Neurologic:  Awake, alert, speech clear and appropriate  Skin:  Vesicles on left chest under the breast and back  Access: LIJ permcath    Basic Metabolic Panel: Recent Labs  Lab 04/02/20 1112 04/03/20 0659  NA 135 135  K 4.2 4.5  CL 92* 96*  CO2 28 24  GLUCOSE 185* 94  BUN 35* 43*  CREATININE 6.38* 7.19*  CALCIUM 9.2 8.1*    Liver Function Tests: Recent Labs  Lab 04/02/20 1112  AST 19  ALT 24  ALKPHOS 89  BILITOT 0.8  PROT 7.2  ALBUMIN 3.9   Recent Labs  Lab 04/02/20 1112  LIPASE 39   No results for input(s): AMMONIA in the last 168  hours.  CBC: Recent Labs  Lab 04/02/20 1112 04/03/20 0659 04/04/20 0459  WBC 4.3 4.7 5.5  HGB 12.8 12.0 12.0  HCT 42.2 39.0 39.8  MCV 97.9 98.0 100.0  PLT 109* 104* 98*    Cardiac Enzymes: No results for input(s): CKTOTAL, CKMB, CKMBINDEX, TROPONINI in the last 168 hours.  BNP: Invalid input(s): POCBNP  CBG: Recent Labs  Lab 04/03/20 0732 04/03/20 1701 04/03/20 2321 04/04/20 0748 04/04/20 1154  GLUCAP 97 146* 260* 179* 162*    Microbiology: Results for orders placed or performed during the hospital encounter of 04/02/20  Resp Panel by RT-PCR (Flu A&B, Covid) Nasopharyngeal Swab     Status: None   Collection Time: 04/02/20  2:25 PM   Specimen: Nasopharyngeal Swab; Nasopharyngeal(NP) swabs in vial transport medium  Result Value Ref Range Status   SARS Coronavirus 2 by RT PCR NEGATIVE NEGATIVE Final    Comment: (NOTE) SARS-CoV-2 target nucleic acids are NOT DETECTED.  The SARS-CoV-2 RNA is generally detectable in upper respiratory specimens during the acute phase of infection. The lowest concentration of SARS-CoV-2 viral copies this assay can detect is 138 copies/mL. A negative result does not preclude SARS-Cov-2 infection and should not be used as the sole basis for treatment or other patient management decisions. A negative result may occur with  improper specimen collection/handling, submission of specimen other than nasopharyngeal swab, presence of viral mutation(s) within the areas targeted by this assay, and  inadequate number of viral copies(<138 copies/mL). A negative result must be combined with clinical observations, patient history, and epidemiological information. The expected result is Negative.  Fact Sheet for Patients:  EntrepreneurPulse.com.au  Fact Sheet for Healthcare Providers:  IncredibleEmployment.be  This test is no t yet approved or cleared by the [place] FDA and  has been authorized for  detection and/or diagnosis of SARS-CoV-2 by FDA under an Emergency Use Authorization (EUA). This EUA will remain  in effect (meaning this test can be used) for the duration of the COVID-19 declaration under Section 564(b)(1) of the Act, 21 U.S.C.section 360bbb-3(b)(1), unless the authorization is terminated  or revoked sooner.       Influenza A by PCR NEGATIVE NEGATIVE Final   Influenza B by PCR NEGATIVE NEGATIVE Final    Comment: (NOTE) The Xpert Xpress SARS-CoV-2/FLU/RSV plus assay is intended as an aid in the diagnosis of influenza from Nasopharyngeal swab specimens and should not be used as a sole basis for treatment. Nasal washings and aspirates are unacceptable for Xpert Xpress SARS-CoV-2/FLU/RSV testing.  Fact Sheet for Patients: EntrepreneurPulse.com.au  Fact Sheet for Healthcare Providers: IncredibleEmployment.be  This test is not yet approved or cleared by the [place] FDA and has been authorized for detection and/or diagnosis of SARS-CoV-2 by FDA under an Emergency Use Authorization (EUA). This EUA will remain in effect (meaning this test can be used) for the duration of the COVID-19 declaration under Section 564(b)(1) of the Act, 21 U.S.C. section 360bbb-3(b)(1), unless the authorization is terminated or revoked.  Performed at [facility], [address] [place], [place] [address]   MRSA PCR Screening     Status: None   Collection Time: 04/04/20  6:00 AM   Specimen: Nasopharyngeal  Result Value Ref Range Status   MRSA by PCR NEGATIVE NEGATIVE Final    Comment:        The GeneXpert MRSA Assay (FDA approved for NASAL specimens only), is one component of a comprehensive MRSA colonization surveillance program. It is not intended to diagnose MRSA infection nor to guide or monitor treatment for MRSA infections. Performed at [facility], [address] [place],  [address]     Coagulation  Studies: No results for input(s): LABPROT, INR in the last 72 hours.  Urinalysis: No results for input(s): COLORURINE, LABSPEC, PHURINE, GLUCOSEU, HGBUR, BILIRUBINUR, KETONESUR, PROTEINUR, UROBILINOGEN, NITRITE, LEUKOCYTESUR in the last 72 hours.  Invalid input(s): APPERANCEUR    Imaging: US Abdomen Limited RUQ (LIVER/GB)  Result Date: 04/04/2020 CLINICAL DATA:  69 year old female with possible cholecystitis. EXAM: ULTRASOUND ABDOMEN LIMITED RIGHT UPPER QUADRANT COMPARISON:  CT abdomen pelvis from 04/02/2020 and right upper quadrant ultrasounds from 05/01/2019 and 09/04/2019. FINDINGS: Gallbladder: The gallbladder is relatively decompressed with thickening of the gallbladder wall measuring up to 2.5 mm, significantly decreased from 09/04/2019 comparison. Layering sludge and a few small layering gallstones are again seen. Trace pericholecystic/perihepatic free fluid. Absent sonographic Murphy sign. Common bile duct: Diameter: 3.4 mm Liver: No focal lesion identified. Within normal limits in parenchymal echogenicity and echotexture. Portal vein is patent on color Doppler imaging with normal direction of blood flow towards the liver. Other: No perihepatic ascites. IMPRESSION: No convincing sonographic signs of acute cholecystitis. Chronic gallbladder wall thickening up to 2.5 mm, decreased from May 2021 comparison, most likely secondary to congestive heart failure although chronic cholecystitis is plausible. Similar appearing layering sludge and gallstones in a nondistended gallbladder. If clinical concern for acute cholecystitis persists, recommend HIDA scan for further evaluation. Dylan  Serafina Royals, MD [facility] Radiology Electronically Signed   By: Ruthann Cancer MD   On: 04/04/2020 08:30     Medications:   . sodium chloride     . [START ON 04/05/2020] acyclovir  400 mg Oral Q M,W,F-HD   And  . acyclovir  200 mg Oral Q12H  . apixaban  2.5 mg Oral  BID  . aspirin EC  81 mg Oral Daily  . atorvastatin  80 mg Oral QHS  . brimonidine  1 drop Both Eyes TID  . carvedilol  25 mg Oral BID  . Chlorhexidine Gluconate Cloth  6 each Topical Q0600  . fluticasone  2 spray Each Nare Daily  . hydrALAZINE  50 mg Oral Q8H  . insulin aspart  0-6 Units Subcutaneous TID WC  . isosorbide mononitrate  30 mg Oral Daily  . levETIRAcetam  250 mg Oral Q T,Th,Sa-HD  . mirtazapine  7.5 mg Oral Daily  . omega-3 acid ethyl esters  1 capsule Oral Daily  . pantoprazole  40 mg Oral Daily  . polyvinyl alcohol  1 drop Both Eyes BID  . rOPINIRole  0.25 mg Oral QHS  . sevelamer carbonate  1,600 mg Oral TID WC  . sodium chloride flush  3 mL Intravenous Q12H  . timolol  1 drop Both Eyes TID  . vitamin B-12  1,000 mcg Oral Daily   sodium chloride, acetaminophen, cloNIDine, iohexol, meclizine, ondansetron **OR** ondansetron (ZOFRAN) IV, sodium chloride flush  Assessment/ Plan:  [name] is a 69 y.o. black female with end stage renal disease on hemodialysis, peripheral vascular disease status post bilateral BKA, diabetes mellitus type II, coronary artery disease status post CABG admitted to [facility] on 04/02/2020 for Acute cholecystitis [K81.0] Respiratory arrest ([place]) [R09.2] LUQ abdominal pain [R10.12] Chest pain [R07.9] Biliary calculus of other site without obstruction [K80.80]  [facility] Nephrology MWF [address] LIJ permcath 52kg  #  End Stage Renal Disease Patient received dialysis treatment yesterday Volume and electrolyte status acceptable No additional treatment required today Will continue MWF schedule  # Hypertension:  Continue current regimen of carvedilol, hydralazine, isosorbide mononitrate.   # Anemia of chronic kidney disease Lab Results  Component Value Date   HGB 12.0 04/04/2020  No acute indication for Epogen  #Secondary Hyperparathyroidism:  Lab Results  Component Value Date   PTH 385 (H) 09/03/2019   CALCIUM 8.1 (L)  04/03/2020   PHOS 5.8 (H) 09/04/2019  Continue Sevelamer   LOS: 2 Antonia Jicha 12/14/20212:51 PM

## 2020-04-04 NOTE — Progress Notes (Signed)
PT Cancellation Note  Patient Details Name: Cairo Lingenfelter MRN: [id number] DOB: 08/29/1950   Cancelled Treatment:    Reason Eval/Treat Not Completed: SLP screened, no needs identified, will sign off. Per OT evaluation, patient reports she is at baseline level of function and performed lateral scoot from bed to wheelchair independently. No PT needs identified at this time. Completed order.    Jerimah Witucki 04/04/2020, 1:56 PM

## 2020-04-05 DIAGNOSIS — R079 Chest pain, unspecified: Secondary | ICD-10-CM

## 2020-04-05 DIAGNOSIS — Z992 Dependence on renal dialysis: Secondary | ICD-10-CM

## 2020-04-05 DIAGNOSIS — N186 End stage renal disease: Secondary | ICD-10-CM

## 2020-04-05 DIAGNOSIS — B029 Zoster without complications: Secondary | ICD-10-CM

## 2020-04-05 LAB — CBC
HCT: 39.7 % (ref 36.0–46.0)
Hemoglobin: 12.2 g/dL (ref 12.0–15.0)
MCH: 30.4 pg (ref 26.0–34.0)
MCHC: 30.7 g/dL (ref 30.0–36.0)
MCV: 99 fL (ref 80.0–100.0)
Platelets: 81 10*3/uL — ABNORMAL LOW (ref 150–400)
RBC: 4.01 MIL/uL (ref 3.87–5.11)
RDW: 16.6 % — ABNORMAL HIGH (ref 11.5–15.5)
WBC: 5.6 10*3/uL (ref 4.0–10.5)
nRBC: 0 % (ref 0.0–0.2)

## 2020-04-05 LAB — GLUCOSE, CAPILLARY
Glucose-Capillary: 142 mg/dL — ABNORMAL HIGH (ref 70–99)
Glucose-Capillary: 171 mg/dL — ABNORMAL HIGH (ref 70–99)
Glucose-Capillary: 124 mg/dL — ABNORMAL HIGH (ref 70–99)
Glucose-Capillary: 313 mg/dL — ABNORMAL HIGH (ref 70–99)

## 2020-04-05 LAB — COMPREHENSIVE METABOLIC PANEL
ALT: 17 U/L (ref 0–44)
AST: 16 U/L (ref 15–41)
Albumin: 3.4 g/dL — ABNORMAL LOW (ref 3.5–5.0)
Alkaline Phosphatase: 91 U/L (ref 38–126)
Anion gap: 12 (ref 5–15)
BUN: 42 mg/dL — ABNORMAL HIGH (ref 8–23)
CO2: 27 mmol/L (ref 22–32)
Calcium: 8.9 mg/dL (ref 8.9–10.3)
Chloride: 96 mmol/L — ABNORMAL LOW (ref 98–111)
Creatinine, Ser: 7.89 mg/dL — ABNORMAL HIGH (ref 0.44–1.00)
GFR, Estimated: 5 mL/min — ABNORMAL LOW (ref >60)
Glucose, Bld: 181 mg/dL — ABNORMAL HIGH (ref 70–99)
Potassium: 5 mmol/L (ref 3.5–5.1)
Sodium: 135 mmol/L (ref 135–145)
Total Bilirubin: 0.6 mg/dL (ref 0.3–1.2)
Total Protein: 6.8 g/dL (ref 6.5–8.1)

## 2020-04-05 LAB — RENAL FUNCTION PANEL
Albumin: 3.4 g/dL — ABNORMAL LOW (ref 3.5–5.0)
Anion gap: 12 (ref 5–15)
BUN: 41 mg/dL — ABNORMAL HIGH (ref 8–23)
CO2: 25 mmol/L (ref 22–32)
Calcium: 8.9 mg/dL (ref 8.9–10.3)
Chloride: 98 mmol/L (ref 98–111)
Creatinine, Ser: 7.86 mg/dL — ABNORMAL HIGH (ref 0.44–1.00)
GFR, Estimated: 5 mL/min — ABNORMAL LOW (ref >60)
Glucose, Bld: 178 mg/dL — ABNORMAL HIGH (ref 70–99)
Phosphorus: 4.6 mg/dL (ref 2.5–4.6)
Potassium: 4.7 mmol/L (ref 3.5–5.1)
Sodium: 135 mmol/L (ref 135–145)

## 2020-04-05 MED ORDER — VALACYCLOVIR HCL 500 MG PO TABS
500.0000 mg | ORAL_TABLET | Freq: Every day | ORAL | Status: DC
  Administered 2020-04-05: 500 mg via ORAL
  Filled 2020-04-05 (×2): qty 1

## 2020-04-05 NOTE — Progress Notes (Addendum)
[facility] Cardiology    SUBJECTIVE: Patient without new complaints resting comfortably in bed denies nausea vomiting shortness of breath is minimal when she is sedentary.  Denies any further chest pain symptoms   Vitals:   04/04/20 1531 04/04/20 1935 04/05/20 0444 04/05/20 0630  BP: (!) 184/76 (!) 151/63  (!) 174/56  Pulse: 61 68  63  Resp: 16     Temp: 97.9 F (36.6 C) 99.3 F (37.4 C)  98.7 F (37.1 C)  TempSrc: Oral Oral  Oral  SpO2: 100% (!) 86%  93%  Weight:   51.2 kg      Intake/Output Summary (Last 24 hours) at 04/05/2020 0756 Last data filed at 04/05/2020 8119 Gross per 24 hour  Intake -  Output 0 ml  Net 0 ml      PHYSICAL EXAM  General: Well developed, well nourished, in no acute distress HEENT:  Normocephalic and atramatic Neck:  No JVD.  Lungs: Clear bilaterally to auscultation and percussion. Heart: HRRR . Normal S1 and S2 without gallops or murmurs.  Abdomen: Bowel sounds are positive, abdomen soft and non-tender  Msk:  Back normal, normal gait. Normal strength and tone for age. Extremities: No clubbing, cyanosis or edema.  Bilateral BKA's Neuro: Alert and oriented X 3. Psych:  Good affect, responds appropriately   LABS: Basic Metabolic Panel: Recent Labs    04/02/20 1112 04/03/20 0659  NA 135 135  K 4.2 4.5  CL 92* 96*  CO2 28 24  GLUCOSE 185* 94  BUN 35* 43*  CREATININE 6.38* 7.19*  CALCIUM 9.2 8.1*   Liver Function Tests: Recent Labs    04/02/20 1112  AST 19  ALT 24  ALKPHOS 89  BILITOT 0.8  PROT 7.2  ALBUMIN 3.9   Recent Labs    04/02/20 1112  LIPASE 39   CBC: Recent Labs    04/04/20 0459 04/05/20 0414  WBC 5.5 5.6  HGB 12.0 12.2  HCT 39.8 39.7  MCV 100.0 99.0  PLT 98* 81*   Cardiac Enzymes: No results for input(s): CKTOTAL, CKMB, CKMBINDEX, TROPONINI in the last 72 hours. BNP: Invalid input(s): POCBNP D-Dimer: No results for input(s): DDIMER in the last 72 hours. Hemoglobin A1C: Recent Labs    04/02/20 1758   HGBA1C 7.8*   Fasting Lipid Panel: No results for input(s): CHOL, HDL, LDLCALC, TRIG, CHOLHDL, LDLDIRECT in the last 72 hours. Thyroid Function Tests: No results for input(s): TSH, T4TOTAL, T3FREE, THYROIDAB in the last 72 hours.  Invalid input(s): FREET3 Anemia Panel: No results for input(s): VITAMINB12, FOLATE, FERRITIN, TIBC, IRON, RETICCTPCT in the last 72 hours.  US Abdomen Limited RUQ (LIVER/GB)  Result Date: 04/04/2020 CLINICAL DATA:  69 year old female with possible cholecystitis. EXAM: ULTRASOUND ABDOMEN LIMITED RIGHT UPPER QUADRANT COMPARISON:  CT abdomen pelvis from 04/02/2020 and right upper quadrant ultrasounds from 05/01/2019 and 09/04/2019. FINDINGS: Gallbladder: The gallbladder is relatively decompressed with thickening of the gallbladder wall measuring up to 2.5 mm, significantly decreased from 09/04/2019 comparison. Layering sludge and a few small layering gallstones are again seen. Trace pericholecystic/perihepatic free fluid. Absent sonographic Murphy sign. Common bile duct: Diameter: 3.4 mm Liver: No focal lesion identified. Within normal limits in parenchymal echogenicity and echotexture. Portal vein is patent on color Doppler imaging with normal direction of blood flow towards the liver. Other: No perihepatic ascites. IMPRESSION: No convincing sonographic signs of acute cholecystitis. Chronic gallbladder wall thickening up to 2.5 mm, decreased from May 2021 comparison, most likely secondary to congestive heart failure although chronic  cholecystitis is plausible. Similar appearing layering sludge and gallstones in a nondistended gallbladder. If clinical concern for acute cholecystitis persists, recommend HIDA scan for further evaluation. Ruthann Cancer, MD [facility] Radiology Electronically Signed   By: Ruthann Cancer MD   On: 04/04/2020 08:30       TELEMETRY: Normal sinus rhythm 70 with bundle-branch block  ASSESSMENT AND  PLAN:  Principal Problem:   Chest pain Active Problems:   Elevated troponin   S/P CABG x 3   ESRD (end stage renal disease) on dialysis ([place])   Type II diabetes mellitus with complication ([place])   Coronary artery disease involving coronary bypass graft of native heart with angina pectoris ([place])   Seizure disorder ([place])   Hypertensive urgency   Chronic respiratory failure with hypoxia (HCC)   Chronic combined systolic and diastolic CHF (congestive heart failure) (HCC)   Thrombocytopenia (HCC)    Plan Borderline troponin probably represent demand ischemia Continue Eliquis therapy for anticoagulation long-term Dialysis treatment via nephrology for end-stage renal disease Chest pain/angina intermittent recurrent recommend aggressive medical therapy imdur beta-blocker aspirin consider adding Ranexa Heart failure therapy needs to be managed via dialysis will unable to diurese her because of renal failure we will continue aggressive medical therapy Continue Keppra therapy for seizure activity Multivessel coronary disease including coronary bypass surgery continue aggressive medical therapy No invasive procedures are planned   Yolonda Kida, MD 04/05/2020 7:56 AM

## 2020-04-05 NOTE — Progress Notes (Signed)
PROGRESS NOTE    Stephanie Ellis  ZES:[id number] DOB: 08/18/1950 DOA: 04/02/2020 PCP: Kirk Ruths, MD   Brief Narrative:  69 y.o.femalewith medical history significant forend-stage renal disease on hemodialysis, dialysis days are M/W/F,history of peripheral arterial disease status post bilateral BKA, diabetes mellitus and coronary artery disease who presented to the emergency room for evaluation of chest pain mostly over the anterior/lateral chest wall which she has had for 2 days.Pain has been constant and not related to rest or exertion.   While in the emergency room she received a dose of hydromorphone 0.5 mg IV and suddenly became unresponsive, confused with sonorous respiration. She was unresponsive to painful or verbal stimuli. Patient went into respiratory arrest, she became bradycardic and was noted to have PEA.ACLS protocol was initiated and chest compressions were started. Patient received 2 doses of Narcan with improvement in her mental status and improvement in her heart rate as well. She is back to her baseline mental status.  12/15: Continued on acyclovir.  Continues to have chest wall pain due to shingles exacerbation   Assessment & Plan:   Principal Problem:   Chest pain Active Problems:   Elevated troponin   S/P CABG x 3   ESRD (end stage renal disease) on dialysis (HCC)   Type II diabetes mellitus with complication (HCC)   Coronary artery disease involving coronary bypass graft of native heart with angina pectoris (HCC)   Seizure disorder (HCC)   Hypertensive urgency   Chronic respiratory failure with hypoxia (HCC)   Chronic combined systolic and diastolic CHF (congestive heart failure) (HCC)   Thrombocytopenia (HCC)  Shingles exacerbation Continues to endorse chest wall pain, improving over interval Few scattered vesicles Plan: DC acyclovir Start Valtrex 500 mg p.o. daily per renal dosing  History of coronary artery disease status post  CABG Continue aspirin, Eliquis Continue statin Continue beta-blocker No further inpatient cardiac diagnostics  Stage renal disease on hemodialysis Nephrology on board HD MWF  Hypertensive urgency Presented with systolic greater than 263 Improved control now Continue current regimen Carvedilol Hydralazine Imdur As needed clonidine  Type 2 diabetes mellitus Adequate sugar control over interval Continue sliding-scale coverage  Acute on chronic combined systolic and diastolic congestive heart failure Last EF 35 to 40% Dialysis for fluid removal Appears at or near euvolemia  Seizure disorder Continue Keppra  Thrombocytopenia No bleeding Platelets.  Baseline  Peripheral arterial disease Status post bilateral BKA Continue aspirin and statin  Status post respiratory arrest Occurred after administration of 1 dose of hydromorphone in ED PEA arrest Received chest compressions and 2 dose Narcan Avoid narcotics  Cholelithiasis Clinical presentation inconsistent with cholecystitis       DVT prophylaxis: Eliquis Code Status: Full Family Communication: None today Disposition Plan: Status is: Inpatient  Remains inpatient appropriate because:Inpatient level of care appropriate due to severity of illness   Dispo: The patient is from: Home              Anticipated d/c is to: Home              Anticipated d/c date is: 1 day              Patient currently is not medically stable to d/c.  Patient continues to endorse chest pain, presumably due to shingles exacerbation.  Anticipate 1 additional day of inpatient treatment and monitoring.  Anticipate discharge home 04/06/2020.      Consultants:   Cardiology  Procedures:   None  Antimicrobials:   Valtrex  Subjective: Seen and examined.  Endorses chest wall pain  Objective: Vitals:   04/05/20 0444 04/05/20 0630 04/05/20 0905 04/05/20 1248  BP:  (!) 174/56 (!) 168/60 (!) 172/65  Pulse:  63 63 65  Resp:    19 19  Temp:  98.7 F (37.1 C) 97.6 F (36.4 C) 98.5 F (36.9 C)  TempSrc:  Oral Oral Oral  SpO2:  93% 95% 92%  Weight: 51.2 kg       Intake/Output Summary (Last 24 hours) at 04/05/2020 1357 Last data filed at 04/05/2020 0958 Gross per 24 hour  Intake 240 ml  Output 0 ml  Net 240 ml   Filed Weights   04/02/20 1742 04/04/20 0343 04/05/20 0444  Weight: 55.8 kg 50.3 kg 51.2 kg    Examination:  General exam: Appears calm and comfortable  Respiratory system: Clear to auscultation. Respiratory effort normal. Cardiovascular system: S1 & S2 heard, RRR. No JVD, murmurs, rubs, gallops or clicks. No pedal edema. Gastrointestinal system: Abdomen is nondistended, soft and nontender. No organomegaly or masses felt. Normal bowel sounds heard. Central nervous system: Alert and oriented. No focal neurological deficits. Extremities: Symmetric 5 x 5 power. Skin: Scattered lesions and excoriations on chest wall Psychiatry: Judgement and insight appear normal. Mood & affect appropriate.     Data Reviewed: I have personally reviewed following labs and imaging studies  CBC: Recent Labs  Lab 04/02/20 1112 04/03/20 0659 04/04/20 0459 04/05/20 0414  WBC 4.3 4.7 5.5 5.6  HGB 12.8 12.0 12.0 12.2  HCT 42.2 39.0 39.8 39.7  MCV 97.9 98.0 100.0 99.0  PLT 109* 104* 98* 81*   Basic Metabolic Panel: Recent Labs  Lab 04/02/20 1112 04/03/20 0659 04/05/20 1003  NA 135 135 135  135  K 4.2 4.5 4.7  5.0  CL 92* 96* 98  96*  CO2 28 24 25  27   GLUCOSE 185* 94 178*  181*  BUN 35* 43* 41*  42*  CREATININE 6.38* 7.19* 7.86*  7.89*  CALCIUM 9.2 8.1* 8.9  8.9  PHOS  --   --  4.6   GFR: Estimated Creatinine Clearance: 3.9 mL/min (A) (by C-G formula based on SCr of 7.86 mg/dL (H)). Liver Function Tests: Recent Labs  Lab 04/02/20 1112 04/05/20 1003  AST 19 16  ALT 24 17  ALKPHOS 89 91  BILITOT 0.8 0.6  PROT 7.2 6.8  ALBUMIN 3.9 3.4*  3.4*   Recent Labs  Lab 04/02/20 1112   LIPASE 39   No results for input(s): AMMONIA in the last 168 hours. Coagulation Profile: No results for input(s): INR, PROTIME in the last 168 hours. Cardiac Enzymes: No results for input(s): CKTOTAL, CKMB, CKMBINDEX, TROPONINI in the last 168 hours. BNP (last 3 results) No results for input(s): PROBNP in the last 8760 hours. HbA1C: Recent Labs    04/02/20 1758  HGBA1C 7.8*   CBG: Recent Labs  Lab 04/04/20 1154 04/04/20 1647 04/04/20 1940 04/05/20 0908 04/05/20 1250  GLUCAP 162* 166* 171* 142* 171*   Lipid Profile: No results for input(s): CHOL, HDL, LDLCALC, TRIG, CHOLHDL, LDLDIRECT in the last 72 hours. Thyroid Function Tests: No results for input(s): TSH, T4TOTAL, FREET4, T3FREE, THYROIDAB in the last 72 hours. Anemia Panel: No results for input(s): VITAMINB12, FOLATE, FERRITIN, TIBC, IRON, RETICCTPCT in the last 72 hours. Sepsis Labs: Recent Labs  Lab 04/02/20 1308  LATICACIDVEN 1.1    Recent Results (from the past 240 hour(s))  Resp Panel by RT-PCR (Flu A&B, Covid) Nasopharyngeal Swab  Status: None   Collection Time: 04/02/20  2:25 PM   Specimen: Nasopharyngeal Swab; Nasopharyngeal(NP) swabs in vial transport medium  Result Value Ref Range Status   SARS Coronavirus 2 by RT PCR NEGATIVE NEGATIVE Final    Comment: (NOTE) SARS-CoV-2 target nucleic acids are NOT DETECTED.  The SARS-CoV-2 RNA is generally detectable in upper respiratory specimens during the acute phase of infection. The lowest concentration of SARS-CoV-2 viral copies this assay can detect is 138 copies/mL. A negative result does not preclude SARS-Cov-2 infection and should not be used as the sole basis for treatment or other patient management decisions. A negative result may occur with  improper specimen collection/handling, submission of specimen other than nasopharyngeal swab, presence of viral mutation(s) within the areas targeted by this assay, and inadequate number of  viral copies(<138 copies/mL). A negative result must be combined with clinical observations, patient history, and epidemiological information. The expected result is Negative.  Fact Sheet for Patients:  EntrepreneurPulse.com.au  Fact Sheet for Healthcare Providers:  IncredibleEmployment.be  This test is no t yet approved or cleared by the [place] FDA and  has been authorized for detection and/or diagnosis of SARS-CoV-2 by FDA under an Emergency Use Authorization (EUA). This EUA will remain  in effect (meaning this test can be used) for the duration of the COVID-19 declaration under Section 564(b)(1) of the Act, 21 U.S.C.section 360bbb-3(b)(1), unless the authorization is terminated  or revoked sooner.       Influenza A by PCR NEGATIVE NEGATIVE Final   Influenza B by PCR NEGATIVE NEGATIVE Final    Comment: (NOTE) The Xpert Xpress SARS-CoV-2/FLU/RSV plus assay is intended as an aid in the diagnosis of influenza from Nasopharyngeal swab specimens and should not be used as a sole basis for treatment. Nasal washings and aspirates are unacceptable for Xpert Xpress SARS-CoV-2/FLU/RSV testing.  Fact Sheet for Patients: EntrepreneurPulse.com.au  Fact Sheet for Healthcare Providers: IncredibleEmployment.be  This test is not yet approved or cleared by the [place] FDA and has been authorized for detection and/or diagnosis of SARS-CoV-2 by FDA under an Emergency Use Authorization (EUA). This EUA will remain in effect (meaning this test can be used) for the duration of the COVID-19 declaration under Section 564(b)(1) of the Act, 21 U.S.C. section 360bbb-3(b)(1), unless the authorization is terminated or revoked.  Performed at [facility], [address] [place], [place] [address]   MRSA PCR Screening     Status: None   Collection Time: 04/04/20  6:00 AM   Specimen: Nasopharyngeal  Result  Value Ref Range Status   MRSA by PCR NEGATIVE NEGATIVE Final    Comment:        The GeneXpert MRSA Assay (FDA approved for NASAL specimens only), is one component of a comprehensive MRSA colonization surveillance program. It is not intended to diagnose MRSA infection nor to guide or monitor treatment for MRSA infections. Performed at [facility], [address], [place], [place] [address]          Radiology Studies: US Abdomen Limited RUQ (LIVER/GB)  Result Date: 04/04/2020 CLINICAL DATA:  69 year old female with possible cholecystitis. EXAM: ULTRASOUND ABDOMEN LIMITED RIGHT UPPER QUADRANT COMPARISON:  CT abdomen pelvis from 04/02/2020 and right upper quadrant ultrasounds from 05/01/2019 and 09/04/2019. FINDINGS: Gallbladder: The gallbladder is relatively decompressed with thickening of the gallbladder wall measuring up to 2.5 mm, significantly decreased from 09/04/2019 comparison. Layering sludge and a few small layering gallstones are again seen. Trace pericholecystic/perihepatic free fluid. Absent sonographic Percell Miller  sign. Common bile duct: Diameter: 3.4 mm Liver: No focal lesion identified. Within normal limits in parenchymal echogenicity and echotexture. Portal vein is patent on color Doppler imaging with normal direction of blood flow towards the liver. Other: No perihepatic ascites. IMPRESSION: No convincing sonographic signs of acute cholecystitis. Chronic gallbladder wall thickening up to 2.5 mm, decreased from May 2021 comparison, most likely secondary to congestive heart failure although chronic cholecystitis is plausible. Similar appearing layering sludge and gallstones in a nondistended gallbladder. If clinical concern for acute cholecystitis persists, recommend HIDA scan for further evaluation. Ruthann Cancer, MD [facility] Radiology Electronically Signed   By: Ruthann Cancer MD   On: 04/04/2020 08:30         Scheduled Meds: . apixaban  2.5 mg Oral BID  . aspirin EC  81 mg Oral Daily  . atorvastatin  80 mg Oral QHS  . brimonidine  1 drop Both Eyes TID  . carvedilol  25 mg Oral BID  . Chlorhexidine Gluconate Cloth  6 each Topical Q0600  . fluticasone  2 spray Each Nare Daily  . hydrALAZINE  50 mg Oral Q8H  . insulin aspart  0-6 Units Subcutaneous TID WC  . isosorbide mononitrate  30 mg Oral Daily  . levETIRAcetam  250 mg Oral Q T,Th,Sa-HD  . mirtazapine  7.5 mg Oral Daily  . omega-3 acid ethyl esters  1 capsule Oral Daily  . pantoprazole  40 mg Oral Daily  . polyvinyl alcohol  1 drop Both Eyes BID  . rOPINIRole  0.25 mg Oral QHS  . sevelamer carbonate  1,600 mg Oral TID WC  . sodium chloride flush  3 mL Intravenous Q12H  . timolol  1 drop Both Eyes TID  . valACYclovir  500 mg Oral Daily  . vitamin B-12  1,000 mcg Oral Daily   Continuous Infusions: . sodium chloride       LOS: 3 days    Time spent: 25 minutes    Sidney Ace, MD [facility] Pager [phone]  If 7PM-7AM, please contact night-coverage 04/05/2020, 1:57 PM

## 2020-04-05 NOTE — Care Management Important Message (Signed)
Important Message  Patient Details  Name: Stephanie Ellis MRN: [id number] Date of Birth: Oct 07, 1950   Medicare Important Message Given:  N/A - LOS <3 / Initial given by admissions   Initial Medicare IM reviewed with patient by Meredith Mody, Patient Access Associate on 04/04/2020 at 10:12am.   Dannette Barbara 04/05/2020, 9:56 AM

## 2020-04-05 NOTE — Progress Notes (Signed)
[facility] Cardiology    SUBJECTIVE: Patient resting comfortably in bed denies any significant pain or symptoms still has had fluctuating elevated blood pressures would not necessarily need to make medication adjustment denies any cardiac type complaints   Vitals:   04/04/20 1531 04/04/20 1935 04/05/20 0444 04/05/20 0630  BP: (!) 184/76 (!) 151/63  (!) 174/56  Pulse: 61 68  63  Resp: 16     Temp: 97.9 F (36.6 C) 99.3 F (37.4 C)  98.7 F (37.1 C)  TempSrc: Oral Oral  Oral  SpO2: 100% (!) 86%  93%  Weight:   51.2 kg      Intake/Output Summary (Last 24 hours) at 04/05/2020 6295 Last data filed at 04/05/2020 2841 Gross per 24 hour  Intake --  Output 0 ml  Net 0 ml      PHYSICAL EXAM  General: Well developed, well nourished, in no acute distress HEENT:  Normocephalic and atramatic Neck:  No JVD.  Lungs: Clear bilaterally to auscultation and percussion. Heart: HRRR . Normal S1 and S2 without gallops or murmurs.  Abdomen: Bowel sounds are positive, abdomen soft and non-tender  Msk:  Back normal, normal gait. Normal strength and tone for age. Extremities: No clubbing, cyanosis or edema.  Bilateral BKA's Neuro: Alert and oriented X 3. Psych:  Good affect, responds appropriately   LABS: Basic Metabolic Panel: Recent Labs    04/02/20 1112 04/03/20 0659  NA 135 135  K 4.2 4.5  CL 92* 96*  CO2 28 24  GLUCOSE 185* 94  BUN 35* 43*  CREATININE 6.38* 7.19*  CALCIUM 9.2 8.1*   Liver Function Tests: Recent Labs    04/02/20 1112  AST 19  ALT 24  ALKPHOS 89  BILITOT 0.8  PROT 7.2  ALBUMIN 3.9   Recent Labs    04/02/20 1112  LIPASE 39   CBC: Recent Labs    04/04/20 0459 04/05/20 0414  WBC 5.5 5.6  HGB 12.0 12.2  HCT 39.8 39.7  MCV 100.0 99.0  PLT 98* 81*   Cardiac Enzymes: No results for input(s): CKTOTAL, CKMB, CKMBINDEX, TROPONINI in the last 72 hours. BNP: Invalid input(s): POCBNP D-Dimer: No results for input(s): DDIMER in the last 72  hours. Hemoglobin A1C: Recent Labs    04/02/20 1758  HGBA1C 7.8*   Fasting Lipid Panel: No results for input(s): CHOL, HDL, LDLCALC, TRIG, CHOLHDL, LDLDIRECT in the last 72 hours. Thyroid Function Tests: No results for input(s): TSH, T4TOTAL, T3FREE, THYROIDAB in the last 72 hours.  Invalid input(s): FREET3 Anemia Panel: No results for input(s): VITAMINB12, FOLATE, FERRITIN, TIBC, IRON, RETICCTPCT in the last 72 hours.  US Abdomen Limited RUQ (LIVER/GB)  Result Date: 04/04/2020 CLINICAL DATA:  69 year old female with possible cholecystitis. EXAM: ULTRASOUND ABDOMEN LIMITED RIGHT UPPER QUADRANT COMPARISON:  CT abdomen pelvis from 04/02/2020 and right upper quadrant ultrasounds from 05/01/2019 and 09/04/2019. FINDINGS: Gallbladder: The gallbladder is relatively decompressed with thickening of the gallbladder wall measuring up to 2.5 mm, significantly decreased from 09/04/2019 comparison. Layering sludge and a few small layering gallstones are again seen. Trace pericholecystic/perihepatic free fluid. Absent sonographic Murphy sign. Common bile duct: Diameter: 3.4 mm Liver: No focal lesion identified. Within normal limits in parenchymal echogenicity and echotexture. Portal vein is patent on color Doppler imaging with normal direction of blood flow towards the liver. Other: No perihepatic ascites. IMPRESSION: No convincing sonographic signs of acute cholecystitis. Chronic gallbladder wall thickening up to 2.5 mm, decreased from May 2021 comparison, most likely secondary to congestive  heart failure although chronic cholecystitis is plausible. Similar appearing layering sludge and gallstones in a nondistended gallbladder. If clinical concern for acute cholecystitis persists, recommend HIDA scan for further evaluation. Ruthann Cancer, MD [facility] Radiology Electronically Signed   By: Ruthann Cancer MD   On: 04/04/2020 08:30     TELEMETRY: Normal sinus  rhythm rate of 65 bundle branch block nonspecific findings  ASSESSMENT AND PLAN:  Principal Problem:   Chest pain Active Problems:   Elevated troponin   S/P CABG x 3   ESRD (end stage renal disease) on dialysis ([place])   Type II diabetes mellitus with complication ([place])   Coronary artery disease involving coronary bypass graft of native heart with angina pectoris ([place])   Seizure disorder ([place])   Hypertensive urgency   Chronic respiratory failure with hypoxia (HCC)   Chronic combined systolic and diastolic CHF (congestive heart failure) ([place])   Thrombocytopenia ([place])    Plan Agree with telemetry Continue supportive measures Continue dialysis with nephrology 3 times a week Maintain diabetes management control Continue antiseizure medications Agree with controlling blood pressure with current medication Long-term anticoagulation with Eliquis Recurrent chest pain do not recommend any invasive strategy or procedures at this point Agree with Protonix therapy for reflux type symptoms Maintain Lipitor therapy for lipid management Conservative cardiac input at this point  Yolonda Kida, MD 04/05/2020 7:52 AM

## 2020-04-05 NOTE — Progress Notes (Addendum)
[facility]  ROUNDING NOTE   Subjective:   [name] was admitted to [facility] on 04/02/2020 for Acute cholecystitis [K81.0] Respiratory arrest ([place]) [R09.2] LUQ abdominal pain [R10.12] Chest pain [R07.9] Biliary calculus of other site without obstruction [K80.80]  Patient resting in bed,denies SOB, nausea or vomiting, reports pain in left lateral chest wall from Shingles.    Objective:  Vital signs in last 24 hours:  Temp:  [97.6 F (36.4 C)-99.3 F (37.4 C)] 98.5 F (36.9 C) (12/15 1248) Pulse Rate:  [61-68] 65 (12/15 1248) Resp:  [16-19] 19 (12/15 1248) BP: (151-184)/(56-76) 172/65 (12/15 1248) SpO2:  [86 %-100 %] 92 % (12/15 1248) Weight:  [51.2 kg] 51.2 kg (12/15 0444)  Weight change: 0.907 kg Filed Weights   04/02/20 1742 04/04/20 0343 04/05/20 0444  Weight: 55.8 kg 50.3 kg 51.2 kg    Intake/Output: No intake/output data recorded.   Intake/Output this shift:  Total I/O In: 240 [P.O.:240] Out: -   Physical Exam: General:  In no acute distress  Head: Normocephalic, atraumatic  Eyes:  Anicteric  Lungs:  lungs clear bilaterally, normal respiratory effort  Heart: Regular rate and rhythm  Abdomen:  Soft, nontender, nondistended  Extremities:  bilateral BKA, no edema noted in upper legs  Neurologic:  Oriented  Skin:  Vesicles on left chest under the breast and back  Access: LIJ permcath    Basic Metabolic Panel: Recent Labs  Lab 04/02/20 1112 04/03/20 0659 04/05/20 1003  NA 135 135 135  135  K 4.2 4.5 4.7  5.0  CL 92* 96* 98  96*  CO2 28 24 25  27   GLUCOSE 185* 94 178*  181*  BUN 35* 43* 41*  42*  CREATININE 6.38* 7.19* 7.86*  7.89*  CALCIUM 9.2 8.1* 8.9  8.9  PHOS  --   --  4.6    Liver Function Tests: Recent Labs  Lab 04/02/20 1112 04/05/20 1003  AST 19 16  ALT 24 17  ALKPHOS 89 91  BILITOT 0.8 0.6  PROT 7.2 6.8  ALBUMIN 3.9 3.4*  3.4*   Recent Labs  Lab 04/02/20 1112  LIPASE 39   No results for  input(s): AMMONIA in the last 168 hours.  CBC: Recent Labs  Lab 04/02/20 1112 04/03/20 0659 04/04/20 0459 04/05/20 0414  WBC 4.3 4.7 5.5 5.6  HGB 12.8 12.0 12.0 12.2  HCT 42.2 39.0 39.8 39.7  MCV 97.9 98.0 100.0 99.0  PLT 109* 104* 98* 81*    Cardiac Enzymes: No results for input(s): CKTOTAL, CKMB, CKMBINDEX, TROPONINI in the last 168 hours.  BNP: Invalid input(s): POCBNP  CBG: Recent Labs  Lab 04/04/20 1154 04/04/20 1647 04/04/20 1940 04/05/20 0908 04/05/20 1250  GLUCAP 162* 166* 171* 142* 171*    Microbiology: Results for orders placed or performed during the hospital encounter of 04/02/20  Resp Panel by RT-PCR (Flu A&B, Covid) Nasopharyngeal Swab     Status: None   Collection Time: 04/02/20  2:25 PM   Specimen: Nasopharyngeal Swab; Nasopharyngeal(NP) swabs in vial transport medium  Result Value Ref Range Status   SARS Coronavirus 2 by RT PCR NEGATIVE NEGATIVE Final    Comment: (NOTE) SARS-CoV-2 target nucleic acids are NOT DETECTED.  The SARS-CoV-2 RNA is generally detectable in upper respiratory specimens during the acute phase of infection. The lowest concentration of SARS-CoV-2 viral copies this assay can detect is 138 copies/mL. A negative result does not preclude SARS-Cov-2 infection and should not be used as the sole basis  for treatment or other patient management decisions. A negative result may occur with  improper specimen collection/handling, submission of specimen other than nasopharyngeal swab, presence of viral mutation(s) within the areas targeted by this assay, and inadequate number of viral copies(<138 copies/mL). A negative result must be combined with clinical observations, patient history, and epidemiological information. The expected result is Negative.  Fact Sheet for Patients:  EntrepreneurPulse.com.au  Fact Sheet for Healthcare Providers:  IncredibleEmployment.be  This test is no t yet approved  or cleared by the [place] FDA and  has been authorized for detection and/or diagnosis of SARS-CoV-2 by FDA under an Emergency Use Authorization (EUA). This EUA will remain  in effect (meaning this test can be used) for the duration of the COVID-19 declaration under Section 564(b)(1) of the Act, 21 U.S.C.section 360bbb-3(b)(1), unless the authorization is terminated  or revoked sooner.       Influenza A by PCR NEGATIVE NEGATIVE Final   Influenza B by PCR NEGATIVE NEGATIVE Final    Comment: (NOTE) The Xpert Xpress SARS-CoV-2/FLU/RSV plus assay is intended as an aid in the diagnosis of influenza from Nasopharyngeal swab specimens and should not be used as a sole basis for treatment. Nasal washings and aspirates are unacceptable for Xpert Xpress SARS-CoV-2/FLU/RSV testing.  Fact Sheet for Patients: EntrepreneurPulse.com.au  Fact Sheet for Healthcare Providers: IncredibleEmployment.be  This test is not yet approved or cleared by the [place] FDA and has been authorized for detection and/or diagnosis of SARS-CoV-2 by FDA under an Emergency Use Authorization (EUA). This EUA will remain in effect (meaning this test can be used) for the duration of the COVID-19 declaration under Section 564(b)(1) of the Act, 21 U.S.C. section 360bbb-3(b)(1), unless the authorization is terminated or revoked.  Performed at [facility], [address] [place], [place] [address]   MRSA PCR Screening     Status: None   Collection Time: 04/04/20  6:00 AM   Specimen: Nasopharyngeal  Result Value Ref Range Status   MRSA by PCR NEGATIVE NEGATIVE Final    Comment:        The GeneXpert MRSA Assay (FDA approved for NASAL specimens only), is one component of a comprehensive MRSA colonization surveillance program. It is not intended to diagnose MRSA infection nor to guide or monitor treatment for MRSA infections. Performed at [facility], [address] [place], [place] [address]     Coagulation Studies: No results for input(s): LABPROT, INR in the last 72 hours.  Urinalysis: No results for input(s): COLORURINE, LABSPEC, PHURINE, GLUCOSEU, HGBUR, BILIRUBINUR, KETONESUR, PROTEINUR, UROBILINOGEN, NITRITE, LEUKOCYTESUR in the last 72 hours.  Invalid input(s): APPERANCEUR    Imaging: US Abdomen Limited RUQ (LIVER/GB)  Result Date: 04/04/2020 CLINICAL DATA:  69 year old female with possible cholecystitis. EXAM: ULTRASOUND ABDOMEN LIMITED RIGHT UPPER QUADRANT COMPARISON:  CT abdomen pelvis from 04/02/2020 and right upper quadrant ultrasounds from 05/01/2019 and 09/04/2019. FINDINGS: Gallbladder: The gallbladder is relatively decompressed with thickening of the gallbladder wall measuring up to 2.5 mm, significantly decreased from 09/04/2019 comparison. Layering sludge and a few small layering gallstones are again seen. Trace pericholecystic/perihepatic free fluid. Absent sonographic Murphy sign. Common bile duct: Diameter: 3.4 mm Liver: No focal lesion identified. Within normal limits in parenchymal echogenicity and echotexture. Portal vein is patent on color Doppler imaging with normal direction of blood flow towards the liver. Other: No perihepatic ascites. IMPRESSION: No convincing sonographic signs of acute cholecystitis. Chronic gallbladder wall thickening up to 2.5 mm, decreased from May 2021 comparison,  most likely secondary to congestive heart failure although chronic cholecystitis is plausible. Similar appearing layering sludge and gallstones in a nondistended gallbladder. If clinical concern for acute cholecystitis persists, recommend HIDA scan for further evaluation. Ruthann Cancer, MD [facility] Electronically Signed   By: Ruthann Cancer MD   On: 04/04/2020 08:30     Medications:   . sodium chloride     . apixaban  2.5 mg Oral BID  . aspirin EC  81 mg Oral  Daily  . atorvastatin  80 mg Oral QHS  . brimonidine  1 drop Both Eyes TID  . carvedilol  25 mg Oral BID  . Chlorhexidine Gluconate Cloth  6 each Topical Q0600  . fluticasone  2 spray Each Nare Daily  . hydrALAZINE  50 mg Oral Q8H  . insulin aspart  0-6 Units Subcutaneous TID WC  . isosorbide mononitrate  30 mg Oral Daily  . levETIRAcetam  250 mg Oral Q T,Th,Sa-HD  . mirtazapine  7.5 mg Oral Daily  . omega-3 acid ethyl esters  1 capsule Oral Daily  . pantoprazole  40 mg Oral Daily  . polyvinyl alcohol  1 drop Both Eyes BID  . rOPINIRole  0.25 mg Oral QHS  . sevelamer carbonate  1,600 mg Oral TID WC  . sodium chloride flush  3 mL Intravenous Q12H  . timolol  1 drop Both Eyes TID  . valACYclovir  500 mg Oral Daily  . vitamin B-12  1,000 mcg Oral Daily   sodium chloride, acetaminophen, cloNIDine, diphenhydrAMINE, iohexol, meclizine, ondansetron **OR** ondansetron (ZOFRAN) IV, sodium chloride flush  Assessment/ Plan:  [name] is a 69 y.o. black female with end stage renal disease on hemodialysis, peripheral vascular disease status post bilateral BKA, diabetes mellitus type II, coronary artery disease status post CABG admitted to [facility] on 04/02/2020 for Acute cholecystitis [K81.0] Respiratory arrest ([place]) [R09.2] LUQ abdominal pain [R10.12] Chest pain [R07.9] Biliary calculus of other site without obstruction [K80.80]  [facility]rology MWF [facility] permcath 52kg  #  End Stage Renal Disease Planning dialysis treatment later today Will continue MWF schedule   # Hypertension:  BP readings stay above goal Patient is on carvedilol, hydralazine, Imdur  # Anemia of chronic kidney disease Lab Results  Component Value Date   HGB 12.2 04/05/2020  Will continue monitoring CBCs  #Secondary Hyperparathyroidism:  Lab Results  Component Value Date   PTH 385 (H) 09/03/2019   CALCIUM 8.9 04/05/2020   CALCIUM 8.9 04/05/2020   PHOS 4.6 04/05/2020  Patient is on  Sevelamer 1684m TID  #Shingles Exacerbation Patient is on Airborne isolation precautions Acyclovir started yesterday   LOS: 3 Stephanie Ellis 12/15/20212:50 PM

## 2020-04-06 LAB — GLUCOSE, CAPILLARY
Glucose-Capillary: 178 mg/dL — ABNORMAL HIGH (ref 70–99)
Glucose-Capillary: 164 mg/dL — ABNORMAL HIGH (ref 70–99)

## 2020-04-06 MED ORDER — VALACYCLOVIR HCL 500 MG PO TABS: 500.0000 mg | ORAL_TABLET | Freq: Every day | ORAL | 0 refills | Status: DC

## 2020-04-06 MED ORDER — TRAMADOL HCL 50 MG PO TABS: 50.0000 mg | ORAL_TABLET | Freq: Two times a day (BID) | ORAL | 0 refills | Status: DC | PRN

## 2020-04-06 NOTE — Progress Notes (Addendum)
[facility]  ROUNDING NOTE   Subjective:   Stephanie Ellis was admitted to [facility] on 04/02/2020 for Acute cholecystitis [K81.0] Respiratory arrest ([place]) [R09.2] LUQ abdominal pain [R10.12] Chest pain [R07.9] Biliary calculus of other site without obstruction [K80.80]  Patient appears much comfortable today, reports pain from shingles is getting better. She received dialysis treatment yesterday, tolerated well  Objective:  Vital signs in last 24 hours:  Temp:  [98.2 F (36.8 C)-98.6 F (37 C)] 98.3 F (36.8 C) (12/16 1201) Pulse Rate:  [63-66] 66 (12/16 1201) Resp:  [17-18] 17 (12/16 1201) BP: (140-186)/(51-95) 141/66 (12/16 1201) SpO2:  [95 %-100 %] 100 % (12/16 1201) Weight:  [51.3 kg] 51.3 kg (12/16 0411)  Weight change: 0.136 kg Filed Weights   04/04/20 0343 04/05/20 0444 04/06/20 0411  Weight: 50.3 kg 51.2 kg 51.3 kg    Intake/Output: I/O last 3 completed shifts: In: 240 [P.O.:240] Out: 2100 [Other:2100]   Intake/Output this shift:  Total I/O In: 240 [P.O.:240] Out: 0   Physical Exam: General:  Resting in bed, in no acute distress  Head:  Oral mucous membranes moist  Eyes:  Sclerae and conjunctivae clear  Lungs:  Respirations symmetrical and unlabored, lungs clear  Heart:  S1-S2, no rubs or gallops  Abdomen:  Soft, nontender, nondistended  Extremities:  bilateral BKA, no edema noted in upper legs  Neurologic:  Awake, alert, speech clear and appropriate  Skin:  Vesicles on left chest under the breast and back  Access: LIJ permcath    Basic Metabolic Panel: Recent Labs  Lab 04/02/20 1112 04/03/20 0659 04/05/20 1003  NA 135 135 135  135  K 4.2 4.5 4.7  5.0  CL 92* 96* 98  96*  CO2 28 24 25  27   GLUCOSE 185* 94 178*  181*  BUN 35* 43* 41*  42*  CREATININE 6.38* 7.19* 7.86*  7.89*  CALCIUM 9.2 8.1* 8.9  8.9  PHOS  --   --  4.6    Liver Function Tests: Recent Labs  Lab 04/02/20 1112 04/05/20 1003  AST 19 16  ALT 24 17   ALKPHOS 89 91  BILITOT 0.8 0.6  PROT 7.2 6.8  ALBUMIN 3.9 3.4*  3.4*   Recent Labs  Lab 04/02/20 1112  LIPASE 39   No results for input(s): AMMONIA in the last 168 hours.  CBC: Recent Labs  Lab 04/02/20 1112 04/03/20 0659 04/04/20 0459 04/05/20 0414  WBC 4.3 4.7 5.5 5.6  HGB 12.8 12.0 12.0 12.2  HCT 42.2 39.0 39.8 39.7  MCV 97.9 98.0 100.0 99.0  PLT 109* 104* 98* 81*    Cardiac Enzymes: No results for input(s): CKTOTAL, CKMB, CKMBINDEX, TROPONINI in the last 168 hours.  BNP: Invalid input(s): POCBNP  CBG: Recent Labs  Lab 04/05/20 1250 04/05/20 1556 04/05/20 2000 04/06/20 0845 04/06/20 1200  GLUCAP 171* 124* 313* 178* 164*    Microbiology: Results for orders placed or performed during the hospital encounter of 04/02/20  Resp Panel by RT-PCR (Flu A&B, Covid) Nasopharyngeal Swab     Status: None   Collection Time: 04/02/20  2:25 PM   Specimen: Nasopharyngeal Swab; Nasopharyngeal(NP) swabs in vial transport medium  Result Value Ref Range Status   SARS Coronavirus 2 by RT PCR NEGATIVE NEGATIVE Final    Comment: (NOTE) SARS-CoV-2 target nucleic acids are NOT DETECTED.  The SARS-CoV-2 RNA is generally detectable in upper respiratory specimens during the acute phase of infection. The lowest concentration of SARS-CoV-2 viral copies this  assay can detect is 138 copies/mL. A negative result does not preclude SARS-Cov-2 infection and should not be used as the sole basis for treatment or other patient management decisions. A negative result may occur with  improper specimen collection/handling, submission of specimen other than nasopharyngeal swab, presence of viral mutation(s) within the areas targeted by this assay, and inadequate number of viral copies(<138 copies/mL). A negative result must be combined with clinical observations, patient history, and epidemiological information. The expected result is Negative.  Fact Sheet for Patients:   EntrepreneurPulse.com.au  Fact Sheet for Healthcare Providers:  IncredibleEmployment.be  This test is no t yet approved or cleared by the [place] FDA and  has been authorized for detection and/or diagnosis of SARS-CoV-2 by FDA under an Emergency Use Authorization (EUA). This EUA will remain  in effect (meaning this test can be used) for the duration of the COVID-19 declaration under Section 564(b)(1) of the Act, 21 U.S.C.section 360bbb-3(b)(1), unless the authorization is terminated  or revoked sooner.       Influenza A by PCR NEGATIVE NEGATIVE Final   Influenza B by PCR NEGATIVE NEGATIVE Final    Comment: (NOTE) The Xpert Xpress SARS-CoV-2/FLU/RSV plus assay is intended as an aid in the diagnosis of influenza from Nasopharyngeal swab specimens and should not be used as a sole basis for treatment. Nasal washings and aspirates are unacceptable for Xpert Xpress SARS-CoV-2/FLU/RSV testing.  Fact Sheet for Patients: EntrepreneurPulse.com.au  Fact Sheet for Healthcare Providers: IncredibleEmployment.be  This test is not yet approved or cleared by the [place] FDA and has been authorized for detection and/or diagnosis of SARS-CoV-2 by FDA under an Emergency Use Authorization (EUA). This EUA will remain in effect (meaning this test can be used) for the duration of the COVID-19 declaration under Section 564(b)(1) of the Act, 21 U.S.C. section 360bbb-3(b)(1), unless the authorization is terminated or revoked.  Performed at [facility], [address] [place], [place] [address]   MRSA PCR Screening     Status: None   Collection Time: 04/04/20  6:00 AM   Specimen: Nasopharyngeal  Result Value Ref Range Status   MRSA by PCR NEGATIVE NEGATIVE Final    Comment:        The GeneXpert MRSA Assay (FDA approved for NASAL specimens only), is one component of a comprehensive MRSA  colonization surveillance program. It is not intended to diagnose MRSA infection nor to guide or monitor treatment for MRSA infections. Performed at [facility], [address] [place], [place] [address]     Coagulation Studies: No results for input(s): LABPROT, INR in the last 72 hours.  Urinalysis: No results for input(s): COLORURINE, LABSPEC, PHURINE, GLUCOSEU, HGBUR, BILIRUBINUR, KETONESUR, PROTEINUR, UROBILINOGEN, NITRITE, LEUKOCYTESUR in the last 72 hours.  Invalid input(s): APPERANCEUR    Imaging: No results found.   Medications:   . sodium chloride     . apixaban  2.5 mg Oral BID  . aspirin EC  81 mg Oral Daily  . atorvastatin  80 mg Oral QHS  . brimonidine  1 drop Both Eyes TID  . carvedilol  25 mg Oral BID  . Chlorhexidine Gluconate Cloth  6 each Topical Q0600  . fluticasone  2 spray Each Nare Daily  . hydrALAZINE  50 mg Oral Q8H  . insulin aspart  0-6 Units Subcutaneous TID WC  . isosorbide mononitrate  30 mg Oral Daily  . levETIRAcetam  250 mg Oral Q T,Th,Sa-HD  . mirtazapine  7.5 mg Oral Daily  .  omega-3 acid ethyl esters  1 capsule Oral Daily  . pantoprazole  40 mg Oral Daily  . polyvinyl alcohol  1 drop Both Eyes BID  . rOPINIRole  0.25 mg Oral QHS  . sevelamer carbonate  1,600 mg Oral TID WC  . sodium chloride flush  3 mL Intravenous Q12H  . timolol  1 drop Both Eyes TID  . valACYclovir  500 mg Oral Daily  . vitamin B-12  1,000 mcg Oral Daily   sodium chloride, acetaminophen, cloNIDine, diphenhydrAMINE, iohexol, meclizine, ondansetron **OR** ondansetron (ZOFRAN) IV, sodium chloride flush  Assessment/ Plan:  Ms. Cherree Conerly is a 69 y.o. black female with end stage renal disease on hemodialysis, peripheral vascular disease status post bilateral BKA, diabetes mellitus type II, coronary artery disease status post CABG admitted to [facility] on 04/02/2020 for Acute cholecystitis [K81.0] Respiratory arrest ([place]) [R09.2] LUQ abdominal pain  [R10.12] Chest pain [R07.9] Biliary calculus of other site without obstruction [K80.80]  [facility] Nephrology MWF [facility] permcath 52kg  #  End Stage Renal Disease Patient received dialysis treatment yesterday Volume and electrolyte status acceptable No additional treatment required today We will continue MWF schedule   # Hypertension:  BP 157/65 Continue current antihypertensive regimen  # Anemia of chronic kidney disease Lab Results  Component Value Date   HGB 12.2 04/05/2020   No acute indication for Epogen  #Secondary Hyperparathyroidism:  Lab Results  Component Value Date   PTH 385 (H) 09/03/2019   CALCIUM 8.9 04/05/2020   CALCIUM 8.9 04/05/2020   PHOS 4.6 04/05/2020  Will continue monitoring bone mineral metabolism parameters  #Shingles Exacerbation Patient is on valacyclovir Reports pain better today   LOS: 4 Kirstie Larsen 12/16/20211:20 PM

## 2020-04-06 NOTE — Discharge Summary (Signed)
Physician Discharge Summary  Tiare Rohlman RKY:[id number] DOB: 05-12-1950 DOA: 04/02/2020  PCP: Kirk Ruths, MD  Admit date: 04/02/2020 Discharge date: 04/06/2020  Admitted From: Home Disposition:  Home  Recommendations for Outpatient Follow-up:  1. Follow up with PCP in 1-2 weeks 2.   Home Health:No Equipment/Devices:None Discharge Condition:Stable CODE STATUS:Full Diet recommendation: Regular  Brief/Interim Summary: 69 y.o.femalewith medical history significant forend-stage renal disease on hemodialysis, dialysis days are M/W/F,history of peripheral arterial disease status post bilateral BKA, diabetes mellitus and coronary artery disease who presentedto the emergency room for evaluation of chest pain mostly over the anterior/lateral chest wall which she has had for 2 days.Pain has been constant and not related to rest or exertion.   While in the emergency room she received a dose of hydromorphone 0.5 mg IV and suddenly became unresponsive, confused with sonorous respiration. She was unresponsive to painful or verbal stimuli. Patient went into respiratory arrest, she became bradycardic and was noted to have PEA.ACLS protocol was initiated and chest compressions were started. Patient received 2 doses of Narcan with improvement in her mental status and improvement in her heart rate as well. She is back to her baseline mental status.  12/15: Continued on acyclovir.  Continues to have chest wall pain due to shingles exacerbation  12/16: Switch to valacyclovir.  Will complete 7-day course.  Chest wall pain and back pain improved secondary to shingles.  No acute issues requiring hospitalization at this time.  Stable for discharge.  Discharge Diagnoses:  Principal Problem:   Chest pain Active Problems:   Elevated troponin   S/P CABG x 3   ESRD (end stage renal disease) on dialysis (HCC)   Type II diabetes mellitus with complication (HCC)   Coronary artery  disease involving coronary bypass graft of native heart with angina pectoris (HCC)   Seizure disorder (HCC)   Hypertensive urgency   Chronic respiratory failure with hypoxia (HCC)   Chronic combined systolic and diastolic CHF (congestive heart failure) (HCC)   Thrombocytopenia (HCC)  Shingles exacerbation Continues to endorse chest wall pain, improving over interval Few scattered vesicles Valtrex 500 mg p.o. daily for 7-day course Prescribed on discharge  History of coronary artery disease status post CABG Continue aspirin, Eliquis Continue statin Continue beta-blocker No further inpatient cardiac diagnostics  Stage renal disease on hemodialysis Nephrology on board HD MWF Can resume normal outpatient schedule  Hypertensive urgency Presented with systolic greater than 315 Improved control now Continue current regimen Carvedilol Hydralazine Imdur As needed clonidine  Type 2 diabetes mellitus Adequate sugar control over interval Resume home regimen on discharge  Acute on chronic combined systolic and diastolic congestive heart failure Last EF 35 to 40% Dialysis for fluid removal Appears at or near euvolemia  Seizure disorder Continue Keppra  Thrombocytopenia No bleeding Platelets.  Baseline  Peripheral arterial disease Status post bilateral BKA Continue aspirin and statin  Status post respiratory arrest Occurred after administration of 1 dose of hydromorphone in ED PEA arrest Received chest compressions and 2 dose Narcan Avoid narcotics  Cholelithiasis Clinical presentation inconsistent with cholecystitis  Discharge Instructions  Discharge Instructions    Diet - low sodium heart healthy   Complete by: As directed    Increase activity slowly   Complete by: As directed    No wound care   Complete by: As directed      Allergies as of 04/06/2020      Reactions   Chlorthalidone Anaphylaxis, Itching, Rash   Dilaudid [hydromorphone Hcl]  Anaphylaxis  Hypersensitivity. 0.6m caused pt to become unresponsive   Fentanyl Rash   Midazolam Rash   Morphine And Related Anaphylaxis   Pt unresponsive, apneic after 0.532mdilaudid    Ace Inhibitors Other (See Comments)   Reaction:  Hyperkalemia, agitation    Angiotensin Receptor Blockers Other (See Comments)   Reaction:  Hyperkalemia, agitation    Norvasc [amlodipine] Itching, Rash   Phenergan [promethazine Hcl] Anxiety   "antsy, can't sit still"      Medication List    TAKE these medications   apixaban 2.5 MG Tabs tablet Commonly known as: ELIQUIS Take 1 tablet (2.5 mg total) by mouth 2 (two) times daily.   aspirin EC 81 MG tablet Take 1 tablet (81 mg total) by mouth daily.   atorvastatin 80 MG tablet Commonly known as: LIPITOR Take 80 mg by mouth at bedtime.   brimonidine 0.2 % ophthalmic solution Commonly known as: ALPHAGAN Place 1 drop into both eyes 3 (three) times daily.   carvedilol 25 MG tablet Commonly known as: COREG Take 1 tablet (25 mg total) by mouth 2 (two) times daily.   cloNIDine 0.1 MG tablet Commonly known as: CATAPRES Take 0.1 mg by mouth daily as needed (BP >160/80).   fluticasone 50 MCG/ACT nasal spray Commonly known as: FLONASE Place 2 sprays into both nostrils daily.   hydrALAZINE 50 MG tablet Commonly known as: APRESOLINE Take 1 tablet (50 mg total) by mouth every 8 (eight) hours.   insulin aspart 100 UNIT/ML injection Commonly known as: novoLOG Inject 0-6 Units into the skin See admin instructions. Inject under the skin according to blood glucose reading:  <201: 0u 201- 250: 2u 251-300: 4u 301-350: 5u 351-400: 6u   insulin glargine 100 UNIT/ML injection Commonly known as: LANTUS Inject 5 Units into the skin daily in the afternoon.   ipratropium 0.06 % nasal spray Commonly known as: ATROVENT Place 2 sprays into both nostrils 2 (two) times daily.   isosorbide mononitrate 30 MG 24 hr tablet Commonly known as: IMDUR Take  30 mg by mouth daily.   levETIRAcetam 250 MG tablet Commonly known as: KEPPRA Three times weekly after dialysis What changed:   how much to take  how to take this  when to take this  additional instructions Notes to patient: See above instructions    meclizine 25 MG tablet Commonly known as: ANTIVERT Take 25 mg by mouth every 4 (four) hours as needed for dizziness.   mirtazapine 15 MG tablet Commonly known as: REMERON Take 7.5 mg by mouth daily.   Omega-3 1000 MG Caps Take 2 capsules by mouth daily.   pantoprazole 40 MG tablet Commonly known as: PROTONIX Take 1 tablet (40 mg total) by mouth daily.   Refresh 1.4-0.6 % Soln Generic drug: Polyvinyl Alcohol-Povidone PF Place 1 drop into both eyes 2 (two) times a day.   rOPINIRole 0.25 MG tablet Commonly known as: REQUIP Take 0.25 mg by mouth at bedtime.   sevelamer carbonate 800 MG tablet Commonly known as: RENVELA 2 Tablet(s) By Mouth 3 Times Daily   timolol 0.5 % ophthalmic solution Commonly known as: TIMOPTIC Place 1 drop into both eyes 3 (three) times daily.   traMADol 50 MG tablet Commonly known as: ULTRAM Take 1 tablet (50 mg total) by mouth every 12 (twelve) hours as needed for up to 5 days for moderate pain or severe pain. What changed: reasons to take this   triamcinolone 0.1 % Commonly known as: KENALOG Apply topically.   valACYclovir 500 MG  tablet Commonly known as: VALTREX Take 1 tablet (500 mg total) by mouth daily for 6 days.   vitamin B-12 1000 MCG tablet Commonly known as: CYANOCOBALAMIN Take 1 tablet (1,000 mcg total) by mouth daily.       Allergies  Allergen Reactions  . Chlorthalidone Anaphylaxis, Itching and Rash  . Dilaudid [Hydromorphone Hcl] Anaphylaxis    Hypersensitivity. 0.74m caused pt to become unresponsive  . Fentanyl Rash  . Midazolam Rash  . Morphine And Related Anaphylaxis    Pt unresponsive, apneic after 0.551mdilaudid   . Ace Inhibitors Other (See Comments)     Reaction:  Hyperkalemia, agitation   . Angiotensin Receptor Blockers Other (See Comments)    Reaction:  Hyperkalemia, agitation   . Norvasc [Amlodipine] Itching and Rash  . Phenergan [Promethazine Hcl] Anxiety    "antsy, can't sit still"    Consultations:  Cardiology  Nephrology   Procedures/Studies: CT ABDOMEN PELVIS WO CONTRAST  Result Date: 04/02/2020 CLINICAL DATA:  Left upper quadrant abdominal pain EXAM: CT ABDOMEN AND PELVIS WITHOUT CONTRAST TECHNIQUE: Multidetector CT imaging of the abdomen and pelvis was performed following the standard protocol without IV contrast. COMPARISON:  CT 05/01/2019 FINDINGS: Lower chest: Atelectatic changes are present in the lung bases including more bandlike areas of subsegmental atelectasis. Some interlobular septal thickening is noted as well which could reflect early edema. Cardiomegaly appearance. Tip of a dual lumen dialysis catheter is noted. Post sternotomy changes. Coronary artery calcifications. Hepatobiliary: No visible liver lesion on this unenhanced CT. Liver attenuation is within normal limits. Scattered calcifications in the liver are favored to be vascular in nature. Gallbladder wall appears thickened with some pericholecystic fluid though may be redistributed. Layering calcified gallstones and probable sludge present within the lumen. Pancreas: No pancreatic ductal dilatation or surrounding inflammatory changes. Spleen: Normal in size. No concerning splenic lesions. Linear branching calcifications are present. Adrenals/Urinary Tract: Normal adrenal glands. Kidneys are not symmetric in size and normally located. Extensive vascular calcifications in the renal hila. Few subcentimeter hypoattenuating foci too small to fully characterize on CT imaging but statistically likely benign. No visible or contour deforming renal lesions. No urolithiasis or hydronephrosis is evident. Urinary bladder is largely decompressed at the time of exam and therefore  poorly evaluated by CT imaging. Circumferential bladder wall thickening is nonspecific given underdistention. Stomach/Bowel: Distal esophagus, stomach and duodenum are unremarkable. No conspicuous small bowel thickening or dilatation. Portion of small bowel protrudes into a small infraumbilical ventral hernia anteriorly albeit without convincing evidence of mechanical obstruction or vascular compromise at this time. There is some diffuse mild pancolonic mural thickening. Scattered colonic diverticula without focal inflammation to suggest diverticulitis. Vascular/Lymphatic: Extensive atherosclerotic calcification throughout the abdominal aorta and branch vessels. No aneurysm or ectasia. Limited evaluation the absence of contrast. No suspicious or enlarged lymph nodes in the included lymphatic chains. Reproductive: Uterus is surgically absent. No concerning adnexal lesions. Other: Small volume simple attenuation ascites. No free air. Diffuse body wall edema. Fat and bowel containing infraumbilical ventral hernia as above. No other bowel containing hernia. No retroperitoneal or body wall hematoma. Musculoskeletal: Remote fracture deformity of the left inferior pubic ramus. No acute osseous abnormality or suspicious osseous lesion. Musculature is unremarkable. IMPRESSION: 1. Cholelithiasis with gallbladder wall thickening and pericholecystic fluid though may be redistributed. If there is clinical concern for acute cholecystitis, consider further evaluation with right upper quadrant ultrasound. 2. Mild diffuse pancolonic mural thickening, nonspecific, but can be seen with colitis or possibly related to the abdominal ascites.  3. Fat and small bowel containing infraumbilical ventral hernia anteriorly albeit without convincing evidence of mechanical obstruction or vascular compromise at this time. 4. Circumferential bladder wall thickening is nonspecific given underdistention. Recommend correlation with urinalysis to  exclude cystitis. 5. Small volume simple attenuation ascites. Diffuse body wall edema. Correlate for features of anasarca. 6. Cardiomegaly with features of edema and basilar atelectasis. 7. Aortic Atherosclerosis (ICD10-I70.0). Electronically Signed   By: Lovena Le M.D.   On: 04/02/2020 15:19   DG Chest Portable 1 View  Result Date: 04/02/2020 CLINICAL DATA:  Left upper quadrant pain beginning 2 days prior radiating to left chest and back, history of CHF, CAD, diabetes EXAM: PORTABLE CHEST 1 VIEW COMPARISON:  Radiograph 11/03/2019, CT 09/02/2019 FINDINGS: Left IJ approach dual lumen dialysis catheter tip projects over the right atrium. Pacer pads and telemetry leads overlie the chest. Postsurgical changes related to prior CABG including intact and aligned sternotomy wires and multiple surgical clips projecting over the mediastinum. Cardiomegaly is similar to prior portable radiography. The aorta is calcified. The remaining cardiomediastinal contours are unremarkable. Mixed hazy and patchy opacities are present throughout both lungs with indistinct, congested pulmonary vascularity, fissural and septal thickening, and small bilateral effusions. Biapical pleuroparenchymal scarring. No pneumothorax. No acute osseous or soft tissue abnormality. Degenerative changes are present in the imaged spine and shoulders. IMPRESSION: 1. Findings compatible with CHF/volume overload with cardiomegaly and edema, effusions. 2. More coalescent airspace disease is likely to reflect alveolar edema though infection could present similarly. Electronically Signed   By: Lovena Le M.D.   On: 04/02/2020 15:11   US Abdomen Limited RUQ (LIVER/GB)  Result Date: 04/04/2020 CLINICAL DATA:  69 year old female with possible cholecystitis. EXAM: ULTRASOUND ABDOMEN LIMITED RIGHT UPPER QUADRANT COMPARISON:  CT abdomen pelvis from 04/02/2020 and right upper quadrant ultrasounds from 05/01/2019 and 09/04/2019. FINDINGS: Gallbladder: The  gallbladder is relatively decompressed with thickening of the gallbladder wall measuring up to 2.5 mm, significantly decreased from 09/04/2019 comparison. Layering sludge and a few small layering gallstones are again seen. Trace pericholecystic/perihepatic free fluid. Absent sonographic Murphy sign. Common bile duct: Diameter: 3.4 mm Liver: No focal lesion identified. Within normal limits in parenchymal echogenicity and echotexture. Portal vein is patent on color Doppler imaging with normal direction of blood flow towards the liver. Other: No perihepatic ascites. IMPRESSION: No convincing sonographic signs of acute cholecystitis. Chronic gallbladder wall thickening up to 2.5 mm, decreased from May 2021 comparison, most likely secondary to congestive heart failure although chronic cholecystitis is plausible. Similar appearing layering sludge and gallstones in a nondistended gallbladder. If clinical concern for acute cholecystitis persists, recommend HIDA scan for further evaluation. Ruthann Cancer, MD [facility]  [facility] Electronically Signed   By: Ruthann Cancer MD   On: 04/04/2020 08:30    (Echo, Carotid, EGD, Colonoscopy, ERCP)    Subjective: Seen and examined on the day of discharge.  Patient at baseline.  No acute issues.  Pain from shingles improving.  Stable for discharge  Discharge Exam: Vitals:   04/06/20 0850 04/06/20 1201  BP: (!) 162/68 (!) 141/66  Pulse: 65 66  Resp: 18 17  Temp: 98.6 F (37 C) 98.3 F (36.8 C)  SpO2: 100% 100%   Vitals:   04/06/20 0411 04/06/20 0504 04/06/20 0850 04/06/20 1201  BP:  (!) 157/65 (!) 162/68 (!) 141/66  Pulse:  63 65 66  Resp:   18 17  Temp:  98.5 F (36.9 C) 98.6 F (37 C) 98.3 F (36.8  C)  TempSrc:  Oral Oral   SpO2:  100% 100% 100%  Weight: 51.3 kg       General: Pt is alert, awake, not in acute distress Cardiovascular: RRR, S1/S2 +, no rubs, no gallops Respiratory: CTA bilaterally, no  wheezing, no rhonchi Abdominal: Soft, NT, ND, bowel sounds + Extremities: Bilateral BKA    The results of significant diagnostics from this hospitalization (including imaging, microbiology, ancillary and laboratory) are listed below for reference.     Microbiology: Recent Results (from the past 240 hour(s))  Resp Panel by RT-PCR (Flu A&B, Covid) Nasopharyngeal Swab     Status: None   Collection Time: 04/02/20  2:25 PM   Specimen: Nasopharyngeal Swab; Nasopharyngeal(NP) swabs in vial transport medium  Result Value Ref Range Status   SARS Coronavirus 2 by RT PCR NEGATIVE NEGATIVE Final    Comment: (NOTE) SARS-CoV-2 target nucleic acids are NOT DETECTED.  The SARS-CoV-2 RNA is generally detectable in upper respiratory specimens during the acute phase of infection. The lowest concentration of SARS-CoV-2 viral copies this assay can detect is 138 copies/mL. A negative result does not preclude SARS-Cov-2 infection and should not be used as the sole basis for treatment or other patient management decisions. A negative result may occur with  improper specimen collection/handling, submission of specimen other than nasopharyngeal swab, presence of viral mutation(s) within the areas targeted by this assay, and inadequate number of viral copies(<138 copies/mL). A negative result must be combined with clinical observations, patient history, and epidemiological information. The expected result is Negative.  Fact Sheet for Patients:  EntrepreneurPulse.com.au  Fact Sheet for Healthcare Providers:  IncredibleEmployment.be  This test is no t yet approved or cleared by the [place] FDA and  has been authorized for detection and/or diagnosis of SARS-CoV-2 by FDA under an Emergency Use Authorization (EUA). This EUA will remain  in effect (meaning this test can be used) for the duration of the COVID-19 declaration under Section 564(b)(1) of the Act,  21 U.S.C.section 360bbb-3(b)(1), unless the authorization is terminated  or revoked sooner.       Influenza A by PCR NEGATIVE NEGATIVE Final   Influenza B by PCR NEGATIVE NEGATIVE Final    Comment: (NOTE) The Xpert Xpress SARS-CoV-2/FLU/RSV plus assay is intended as an aid in the diagnosis of influenza from Nasopharyngeal swab specimens and should not be used as a sole basis for treatment. Nasal washings and aspirates are unacceptable for Xpert Xpress SARS-CoV-2/FLU/RSV testing.  Fact Sheet for Patients: EntrepreneurPulse.com.au  Fact Sheet for Healthcare Providers: IncredibleEmployment.be  This test is not yet approved or cleared by the [place] FDA and has been authorized for detection and/or diagnosis of SARS-CoV-2 by FDA under an Emergency Use Authorization (EUA). This EUA will remain in effect (meaning this test can be used) for the duration of the COVID-19 declaration under Section 564(b)(1) of the Act, 21 U.S.C. section 360bbb-3(b)(1), unless the authorization is terminated or revoked.  Performed at [facility], [address] [place], [place] [address]   MRSA PCR Screening     Status: None   Collection Time: 04/04/20  6:00 AM   Specimen: Nasopharyngeal  Result Value Ref Range Status   MRSA by PCR NEGATIVE NEGATIVE Final    Comment:        The GeneXpert MRSA Assay (FDA approved for NASAL specimens only), is one component of a comprehensive MRSA colonization surveillance program. It is not intended to diagnose MRSA infection nor to guide or monitor treatment for MRSA  infections. Performed at [facility], [address] [place], [place] [address]      Labs: BNP (last 3 results) Recent Labs    09/02/19 1555  BNP >6,578.4*   Basic Metabolic Panel: Recent Labs  Lab 04/02/20 1112 04/03/20 0659 04/05/20 1003  NA 135 135 135  135  K 4.2 4.5 4.7  5.0  CL 92* 96* 98  96*  CO2 28 24 25   27   GLUCOSE 185* 94 178*  181*  BUN 35* 43* 41*  42*  CREATININE 6.38* 7.19* 7.86*  7.89*  CALCIUM 9.2 8.1* 8.9  8.9  PHOS  --   --  4.6   Liver Function Tests: Recent Labs  Lab 04/02/20 1112 04/05/20 1003  AST 19 16  ALT 24 17  ALKPHOS 89 91  BILITOT 0.8 0.6  PROT 7.2 6.8  ALBUMIN 3.9 3.4*  3.4*   Recent Labs  Lab 04/02/20 1112  LIPASE 39   No results for input(s): AMMONIA in the last 168 hours. CBC: Recent Labs  Lab 04/02/20 1112 04/03/20 0659 04/04/20 0459 04/05/20 0414  WBC 4.3 4.7 5.5 5.6  HGB 12.8 12.0 12.0 12.2  HCT 42.2 39.0 39.8 39.7  MCV 97.9 98.0 100.0 99.0  PLT 109* 104* 98* 81*   Cardiac Enzymes: No results for input(s): CKTOTAL, CKMB, CKMBINDEX, TROPONINI in the last 168 hours. BNP: Invalid input(s): POCBNP CBG: Recent Labs  Lab 04/05/20 1250 04/05/20 1556 04/05/20 2000 04/06/20 0845 04/06/20 1200  GLUCAP 171* 124* 313* 178* 164*   D-Dimer No results for input(s): DDIMER in the last 72 hours. Hgb A1c No results for input(s): HGBA1C in the last 72 hours. Lipid Profile No results for input(s): CHOL, HDL, LDLCALC, TRIG, CHOLHDL, LDLDIRECT in the last 72 hours. Thyroid function studies No results for input(s): TSH, T4TOTAL, T3FREE, THYROIDAB in the last 72 hours.  Invalid input(s): FREET3 Anemia work up No results for input(s): VITAMINB12, FOLATE, FERRITIN, TIBC, IRON, RETICCTPCT in the last 72 hours. Urinalysis    Component Value Date/Time   COLORURINE RED (A) 10/17/2016 1643   APPEARANCEUR TURBID (A) 10/17/2016 1643   LABSPEC 1.014 10/17/2016 1643   PHURINE  10/17/2016 1643    TEST NOT REPORTED DUE TO COLOR INTERFERENCE OF URINE PIGMENT   GLUCOSEU (A) 10/17/2016 1643    TEST NOT REPORTED DUE TO COLOR INTERFERENCE OF URINE PIGMENT   HGBUR (A) 10/17/2016 1643    TEST NOT REPORTED DUE TO COLOR INTERFERENCE OF URINE PIGMENT   BILIRUBINUR (A) 10/17/2016 1643    TEST NOT REPORTED DUE TO COLOR INTERFERENCE OF URINE PIGMENT    KETONESUR (A) 10/17/2016 1643    TEST NOT REPORTED DUE TO COLOR INTERFERENCE OF URINE PIGMENT   PROTEINUR (A) 10/17/2016 1643    TEST NOT REPORTED DUE TO COLOR INTERFERENCE OF URINE PIGMENT   NITRITE (A) 10/17/2016 1643    TEST NOT REPORTED DUE TO COLOR INTERFERENCE OF URINE PIGMENT   LEUKOCYTESUR (A) 10/17/2016 1643    TEST NOT REPORTED DUE TO COLOR INTERFERENCE OF URINE PIGMENT   Sepsis Labs Invalid input(s): PROCALCITONIN,  WBC,  LACTICIDVEN Microbiology Recent Results (from the past 240 hour(s))  Resp Panel by RT-PCR (Flu A&B, Covid) Nasopharyngeal Swab     Status: None   Collection Time: 04/02/20  2:25 PM   Specimen: Nasopharyngeal Swab; Nasopharyngeal(NP) swabs in vial transport medium  Result Value Ref Range Status   SARS Coronavirus 2 by RT PCR NEGATIVE NEGATIVE Final    Comment: (NOTE) SARS-CoV-2 target  nucleic acids are NOT DETECTED.  The SARS-CoV-2 RNA is generally detectable in upper respiratory specimens during the acute phase of infection. The lowest concentration of SARS-CoV-2 viral copies this assay can detect is 138 copies/mL. A negative result does not preclude SARS-Cov-2 infection and should not be used as the sole basis for treatment or other patient management decisions. A negative result may occur with  improper specimen collection/handling, submission of specimen other than nasopharyngeal swab, presence of viral mutation(s) within the areas targeted by this assay, and inadequate number of viral copies(<138 copies/mL). A negative result must be combined with clinical observations, patient history, and epidemiological information. The expected result is Negative.  Fact Sheet for Patients:  EntrepreneurPulse.com.au  Fact Sheet for Healthcare Providers:  IncredibleEmployment.be  This test is no t yet approved or cleared by the [place] FDA and  has been authorized for detection and/or diagnosis of SARS-CoV-2 by FDA  under an Emergency Use Authorization (EUA). This EUA will remain  in effect (meaning this test can be used) for the duration of the COVID-19 declaration under Section 564(b)(1) of the Act, 21 U.S.C.section 360bbb-3(b)(1), unless the authorization is terminated  or revoked sooner.       Influenza A by PCR NEGATIVE NEGATIVE Final   Influenza B by PCR NEGATIVE NEGATIVE Final    Comment: (NOTE) The Xpert Xpress SARS-CoV-2/FLU/RSV plus assay is intended as an aid in the diagnosis of influenza from Nasopharyngeal swab specimens and should not be used as a sole basis for treatment. Nasal washings and aspirates are unacceptable for Xpert Xpress SARS-CoV-2/FLU/RSV testing.  Fact Sheet for Patients: EntrepreneurPulse.com.au  Fact Sheet for Healthcare Providers: IncredibleEmployment.be  This test is not yet approved or cleared by the [place] FDA and has been authorized for detection and/or diagnosis of SARS-CoV-2 by FDA under an Emergency Use Authorization (EUA). This EUA will remain in effect (meaning this test can be used) for the duration of the COVID-19 declaration under Section 564(b)(1) of the Act, 21 U.S.C. section 360bbb-3(b)(1), unless the authorization is terminated or revoked.  Performed at [facility], [address] [place], [place] [id number]   MRSA PCR Screening     Status: None   Collection Time: 04/04/20  6:00 AM   Specimen: Nasopharyngeal  Result Value Ref Range Status   MRSA by PCR NEGATIVE NEGATIVE Final    Comment:        The GeneXpert MRSA Assay (FDA approved for NASAL specimens only), is one component of a comprehensive MRSA colonization surveillance program. It is not intended to diagnose MRSA infection nor to guide or monitor treatment for MRSA infections. Performed at [facility], [address] [place], [place] [address]      Time coordinating discharge: Over 30  minutes  SIGNED:   Sidney Ace, MD  [facility] 04/06/2020, 1:51 PM Pager   If 7PM-7AM, please contact night-coverage

## 2020-04-06 NOTE — Progress Notes (Signed)
Discussed discharge instructions with family and patient.  Encouraged patient to keep all follow up appointments and take medication as prescribed.  Sent home handout regarding shingles.   Instructed family to wear mask and gloves if then need to be in contact with lesion.   Continue isolation as much as possible until lesion has crusted over.

## 2020-04-06 NOTE — Plan of Care (Signed)
  Problem: Health Behavior/Discharge Planning: Goal: Ability to manage health-related needs will improve Outcome: Progressing   Problem: Clinical Measurements: Goal: Ability to maintain clinical measurements within normal limits will improve Outcome: Progressing Goal: Diagnostic test results will improve Outcome: Progressing   Problem: Activity: Goal: Risk for activity intolerance will decrease Outcome: Progressing   Problem: Safety: Goal: Ability to remain free from injury will improve Outcome: Progressing

## 2020-04-06 NOTE — Progress Notes (Signed)
Inpatient Diabetes Program Recommendations  AACE/ADA: New Consensus Statement on Inpatient Glycemic Control   Target Ranges:  Prepandial:   less than 140 mg/dL      Peak postprandial:   less than 180 mg/dL (1-2 hours)      Critically ill patients:  140 - 180 mg/dL   Results for Stephanie Ellis, Stephanie Ellis (MRN [id number]) as of 04/06/2020 06:58  Ref. Range 04/05/2020 09:08 04/05/2020 12:50 04/05/2020 15:56 04/05/2020 20:00  Glucose-Capillary Latest Ref Range: 70 - 99 mg/dL 142 (H) 171 (H) 124 (H) 313 (H)   Review of Glycemic Control  Diabetes history: DM2 Outpatient Diabetes medications: Lantus 5 units daily, Novolog 0-6 units (no frequency noted on home med list) Current orders for Inpatient glycemic control: Novolog 0-6 units TID with meals  Inpatient Diabetes Program Recommendations:    Insulin: Please consider ordering Novolog 0-5 units QHS for bedtime correction.  Thanks, Barnie Alderman, RN, MSN, CDE Diabetes Coordinator Inpatient Diabetes Program [phone] (Team Pager from 8am to 5pm)

## 2020-04-06 NOTE — Progress Notes (Signed)
[facility]    SUBJECTIVE: Patient states to be doing reasonably well tolerating dialysis well improved chest pain minimal shortness of breath minimal leg swelling now here for follow-up patient has known multivessel coronary disease and cardiomyopathy.  Patient denies any fever chills or sweats feels improved since aggressive dialysis management   Vitals:   04/05/20 2138 04/06/20 0411 04/06/20 0504 04/06/20 0850  BP: (!) 160/61  (!) 157/65 (!) 162/68  Pulse:   63 65  Resp:    18  Temp:   98.5 F (36.9 C) 98.6 F (37 C)  TempSrc:   Oral Oral  SpO2:   100% 100%  Weight:  51.3 kg       Intake/Output Summary (Last 24 hours) at 04/06/2020 1112 Last data filed at 04/06/2020 1014 Gross per 24 hour  Intake 240 ml  Output 2100 ml  Net -1860 ml      PHYSICAL EXAM  General: Well developed, well nourished, in no acute distress HEENT:  Normocephalic and atramatic Neck:  No JVD.  Lungs: Clear bilaterally to auscultation and percussion. Heart: HRRR . Normal S1 and S2 without gallops or murmurs.  Abdomen: Bowel sounds are positive, abdomen soft and non-tender  Msk:  Back normal, normal gait. Normal strength and tone for age. Extremities: No clubbing, cyanosis or edema.   Neuro: Alert and oriented X 3. Psych:  Good affect, responds appropriately   LABS: Basic Metabolic Panel: Recent Labs    04/05/20 1003  NA 135  135  K 4.7  5.0  CL 98  96*  CO2 25  27  GLUCOSE 178*  181*  BUN 41*  42*  CREATININE 7.86*  7.89*  CALCIUM 8.9  8.9  PHOS 4.6   Liver Function Tests: Recent Labs    04/05/20 1003  AST 16  ALT 17  ALKPHOS 91  BILITOT 0.6  PROT 6.8  ALBUMIN 3.4*  3.4*   No results for input(s): LIPASE, AMYLASE in the last 72 hours. CBC: Recent Labs    04/04/20 0459 04/05/20 0414  WBC 5.5 5.6  HGB 12.0 12.2  HCT 39.8 39.7  MCV 100.0 99.0  PLT 98* 81*   Cardiac Enzymes: No results for input(s): CKTOTAL, CKMB, CKMBINDEX, TROPONINI in the last 72  hours. BNP: Invalid input(s): POCBNP D-Dimer: No results for input(s): DDIMER in the last 72 hours. Hemoglobin A1C: No results for input(s): HGBA1C in the last 72 hours. Fasting Lipid Panel: No results for input(s): CHOL, HDL, LDLCALC, TRIG, CHOLHDL, LDLDIRECT in the last 72 hours. Thyroid Function Tests: No results for input(s): TSH, T4TOTAL, T3FREE, THYROIDAB in the last 72 hours.  Invalid input(s): FREET3 Anemia Panel: No results for input(s): VITAMINB12, FOLATE, FERRITIN, TIBC, IRON, RETICCTPCT in the last 72 hours.  No results found.   Echo moderate reduced left ventricular function ejection fraction between 35 and 40%  TELEMETRY: Sinus rhythm interventricular conduction delay possible preexcitation rate of about 60  ASSESSMENT AND PLAN:  Principal Problem:   Chest pain Active Problems:   Elevated troponin   S/P CABG x 3   ESRD (end stage renal disease) on dialysis (HCC)   Type II diabetes mellitus with complication (HCC)   Coronary artery disease involving coronary bypass graft of native heart with angina pectoris (HCC)   Seizure disorder (HCC)   Hypertensive urgency   Chronic respiratory failure with hypoxia (HCC)   Chronic combined systolic and diastolic CHF (congestive heart failure) (HCC)   Thrombocytopenia ([place])    Plan Presented with demand ischemia  continue conservative therapy Maintain dialysis treatment for end-stage renal disease We will continue Eliquis for anticoagulation long-term Chest pain possible angina with advanced medical therapy increase Imdur beta-blocker aspirin consider adding Ranexa Volume overload and heart failure will require dialysis therapy to remove excess volume but the patient does not make urine Maintain Keppra therapy for seizure disorder Known history of multivessel coronary disease including coronary bypass surgery but at this stage medical therapy is preferable unless symptoms persist Will defer cardiac cath for now and treat  medically   Yolonda Kida, MD  04/06/2020 11:12 AM

## 2020-04-10 ENCOUNTER — Other Ambulatory Visit: Payer: Self-pay

## 2020-04-10 ENCOUNTER — Inpatient Hospital Stay
Admission: EM | Admit: 2020-04-10 | Discharge: 2020-04-20 | DRG: 077 | Disposition: A | Discharge status: Skilled Nursing Facility | Payer: Medicare Other | Attending: Internal Medicine | Admitting: Internal Medicine

## 2020-04-10 ENCOUNTER — Inpatient Hospital Stay: Payer: Medicare Other

## 2020-04-10 ENCOUNTER — Encounter: Payer: Self-pay | Admitting: Internal Medicine

## 2020-04-10 ENCOUNTER — Emergency Department: Payer: Medicare Other

## 2020-04-10 DIAGNOSIS — Z8673 Personal history of transient ischemic attack (TIA), and cerebral infarction without residual deficits: Secondary | ICD-10-CM

## 2020-04-10 DIAGNOSIS — Z8619 Personal history of other infectious and parasitic diseases: Secondary | ICD-10-CM

## 2020-04-10 DIAGNOSIS — R569 Unspecified convulsions: Secondary | ICD-10-CM

## 2020-04-10 DIAGNOSIS — Z888 Allergy status to other drugs, medicaments and biological substances status: Secondary | ICD-10-CM

## 2020-04-10 DIAGNOSIS — Z800 Family history of malignant neoplasm of digestive organs: Secondary | ICD-10-CM

## 2020-04-10 DIAGNOSIS — G40909 Epilepsy, unspecified, not intractable, without status epilepticus: Secondary | ICD-10-CM

## 2020-04-10 DIAGNOSIS — Z794 Long term (current) use of insulin: Secondary | ICD-10-CM

## 2020-04-10 DIAGNOSIS — R630 Anorexia: Secondary | ICD-10-CM | POA: Diagnosis present

## 2020-04-10 DIAGNOSIS — R946 Abnormal results of thyroid function studies: Secondary | ICD-10-CM | POA: Diagnosis present

## 2020-04-10 DIAGNOSIS — I160 Hypertensive urgency: Secondary | ICD-10-CM | POA: Diagnosis present

## 2020-04-10 DIAGNOSIS — D696 Thrombocytopenia, unspecified: Secondary | ICD-10-CM | POA: Diagnosis present

## 2020-04-10 DIAGNOSIS — Z833 Family history of diabetes mellitus: Secondary | ICD-10-CM

## 2020-04-10 DIAGNOSIS — B029 Zoster without complications: Secondary | ICD-10-CM | POA: Diagnosis present

## 2020-04-10 DIAGNOSIS — Z8674 Personal history of sudden cardiac arrest: Secondary | ICD-10-CM | POA: Diagnosis not present

## 2020-04-10 DIAGNOSIS — E1151 Type 2 diabetes mellitus with diabetic peripheral angiopathy without gangrene: Secondary | ICD-10-CM | POA: Diagnosis present

## 2020-04-10 DIAGNOSIS — W06XXXA Fall from bed, initial encounter: Secondary | ICD-10-CM | POA: Diagnosis not present

## 2020-04-10 DIAGNOSIS — K5900 Constipation, unspecified: Secondary | ICD-10-CM | POA: Diagnosis present

## 2020-04-10 DIAGNOSIS — I700 Atherosclerosis of aorta: Secondary | ICD-10-CM | POA: Diagnosis present

## 2020-04-10 DIAGNOSIS — I674 Hypertensive encephalopathy: Secondary | ICD-10-CM | POA: Diagnosis present

## 2020-04-10 DIAGNOSIS — I132 Hypertensive heart and chronic kidney disease with heart failure and with stage 5 chronic kidney disease, or end stage renal disease: Secondary | ICD-10-CM | POA: Diagnosis present

## 2020-04-10 DIAGNOSIS — R54 Age-related physical debility: Secondary | ICD-10-CM | POA: Diagnosis present

## 2020-04-10 DIAGNOSIS — R4182 Altered mental status, unspecified: Secondary | ICD-10-CM | POA: Diagnosis present

## 2020-04-10 DIAGNOSIS — J189 Pneumonia, unspecified organism: Secondary | ICD-10-CM

## 2020-04-10 DIAGNOSIS — Z89512 Acquired absence of left leg below knee: Secondary | ICD-10-CM

## 2020-04-10 DIAGNOSIS — Z7901 Long term (current) use of anticoagulants: Secondary | ICD-10-CM

## 2020-04-10 DIAGNOSIS — G9341 Metabolic encephalopathy: Secondary | ICD-10-CM | POA: Diagnosis present

## 2020-04-10 DIAGNOSIS — K761 Chronic passive congestion of liver: Secondary | ICD-10-CM | POA: Diagnosis present

## 2020-04-10 DIAGNOSIS — E118 Type 2 diabetes mellitus with unspecified complications: Secondary | ICD-10-CM | POA: Diagnosis not present

## 2020-04-10 DIAGNOSIS — D631 Anemia in chronic kidney disease: Secondary | ICD-10-CM | POA: Diagnosis present

## 2020-04-10 DIAGNOSIS — Z992 Dependence on renal dialysis: Secondary | ICD-10-CM

## 2020-04-10 DIAGNOSIS — Y92230 Patient room in hospital as the place of occurrence of the external cause: Secondary | ICD-10-CM | POA: Diagnosis not present

## 2020-04-10 DIAGNOSIS — N2581 Secondary hyperparathyroidism of renal origin: Secondary | ICD-10-CM | POA: Diagnosis present

## 2020-04-10 DIAGNOSIS — B020 Zoster encephalitis: Secondary | ICD-10-CM | POA: Diagnosis present

## 2020-04-10 DIAGNOSIS — R4781 Slurred speech: Secondary | ICD-10-CM | POA: Diagnosis present

## 2020-04-10 DIAGNOSIS — I2510 Atherosclerotic heart disease of native coronary artery without angina pectoris: Secondary | ICD-10-CM | POA: Diagnosis present

## 2020-04-10 DIAGNOSIS — I5042 Chronic combined systolic (congestive) and diastolic (congestive) heart failure: Secondary | ICD-10-CM | POA: Diagnosis present

## 2020-04-10 DIAGNOSIS — Z885 Allergy status to narcotic agent status: Secondary | ICD-10-CM

## 2020-04-10 DIAGNOSIS — J9811 Atelectasis: Secondary | ICD-10-CM | POA: Diagnosis present

## 2020-04-10 DIAGNOSIS — Z951 Presence of aortocoronary bypass graft: Secondary | ICD-10-CM | POA: Diagnosis not present

## 2020-04-10 DIAGNOSIS — Z8249 Family history of ischemic heart disease and other diseases of the circulatory system: Secondary | ICD-10-CM

## 2020-04-10 DIAGNOSIS — E1122 Type 2 diabetes mellitus with diabetic chronic kidney disease: Secondary | ICD-10-CM | POA: Diagnosis present

## 2020-04-10 DIAGNOSIS — Z20822 Contact with and (suspected) exposure to covid-19: Secondary | ICD-10-CM | POA: Diagnosis present

## 2020-04-10 DIAGNOSIS — K219 Gastro-esophageal reflux disease without esophagitis: Secondary | ICD-10-CM | POA: Diagnosis present

## 2020-04-10 DIAGNOSIS — Z89511 Acquired absence of right leg below knee: Secondary | ICD-10-CM | POA: Diagnosis not present

## 2020-04-10 DIAGNOSIS — F32A Depression, unspecified: Secondary | ICD-10-CM | POA: Diagnosis present

## 2020-04-10 DIAGNOSIS — R410 Disorientation, unspecified: Secondary | ICD-10-CM

## 2020-04-10 DIAGNOSIS — M7989 Other specified soft tissue disorders: Secondary | ICD-10-CM | POA: Diagnosis present

## 2020-04-10 DIAGNOSIS — I252 Old myocardial infarction: Secondary | ICD-10-CM

## 2020-04-10 DIAGNOSIS — Z7982 Long term (current) use of aspirin: Secondary | ICD-10-CM

## 2020-04-10 DIAGNOSIS — Z79899 Other long term (current) drug therapy: Secondary | ICD-10-CM

## 2020-04-10 DIAGNOSIS — I10 Essential (primary) hypertension: Secondary | ICD-10-CM | POA: Diagnosis present

## 2020-04-10 DIAGNOSIS — I4510 Unspecified right bundle-branch block: Secondary | ICD-10-CM | POA: Diagnosis present

## 2020-04-10 DIAGNOSIS — N186 End stage renal disease: Secondary | ICD-10-CM | POA: Diagnosis not present

## 2020-04-10 LAB — CBC WITH DIFFERENTIAL/PLATELET
Abs Immature Granulocytes: 0.01 10*3/uL (ref 0.00–0.07)
Basophils Absolute: 0 10*3/uL (ref 0.0–0.1)
Basophils Relative: 0 %
Eosinophils Absolute: 0.1 10*3/uL (ref 0.0–0.5)
Eosinophils Relative: 1 %
HCT: 43.2 % (ref 36.0–46.0)
Hemoglobin: 13.4 g/dL (ref 12.0–15.0)
Immature Granulocytes: 0 %
Lymphocytes Relative: 18 %
Lymphs Abs: 0.8 10*3/uL (ref 0.7–4.0)
MCH: 29.9 pg (ref 26.0–34.0)
MCHC: 31 g/dL (ref 30.0–36.0)
MCV: 96.4 fL (ref 80.0–100.0)
Monocytes Absolute: 0.3 10*3/uL (ref 0.1–1.0)
Monocytes Relative: 7 %
Neutro Abs: 3 10*3/uL (ref 1.7–7.7)
Neutrophils Relative %: 74 %
Platelets: 113 10*3/uL — ABNORMAL LOW (ref 150–400)
RBC: 4.48 MIL/uL (ref 3.87–5.11)
RDW: 16.7 % — ABNORMAL HIGH (ref 11.5–15.5)
WBC: 4.2 10*3/uL (ref 4.0–10.5)
nRBC: 0 % (ref 0.0–0.2)

## 2020-04-10 LAB — GLUCOSE, CAPILLARY
Glucose-Capillary: 146 mg/dL — ABNORMAL HIGH (ref 70–99)
Glucose-Capillary: 133 mg/dL — ABNORMAL HIGH (ref 70–99)

## 2020-04-10 LAB — COMPREHENSIVE METABOLIC PANEL
ALT: 13 U/L (ref 0–44)
AST: 17 U/L (ref 15–41)
Albumin: 3.7 g/dL (ref 3.5–5.0)
Alkaline Phosphatase: 92 U/L (ref 38–126)
Anion gap: 15 (ref 5–15)
BUN: 53 mg/dL — ABNORMAL HIGH (ref 8–23)
CO2: 28 mmol/L (ref 22–32)
Calcium: 8.9 mg/dL (ref 8.9–10.3)
Chloride: 97 mmol/L — ABNORMAL LOW (ref 98–111)
Creatinine, Ser: 9.33 mg/dL — ABNORMAL HIGH (ref 0.44–1.00)
GFR, Estimated: 4 mL/min — ABNORMAL LOW (ref >60)
Glucose, Bld: 186 mg/dL — ABNORMAL HIGH (ref 70–99)
Potassium: 4.8 mmol/L (ref 3.5–5.1)
Sodium: 140 mmol/L (ref 135–145)
Total Bilirubin: 0.6 mg/dL (ref 0.3–1.2)
Total Protein: 7.4 g/dL (ref 6.5–8.1)

## 2020-04-10 LAB — CBG MONITORING, ED: Glucose-Capillary: 171 mg/dL — ABNORMAL HIGH (ref 70–99)

## 2020-04-10 LAB — AMMONIA: Ammonia: 19 umol/L (ref 9–35)

## 2020-04-10 LAB — RESP PANEL BY RT-PCR (FLU A&B, COVID) ARPGX2
Influenza A by PCR: NEGATIVE
Influenza B by PCR: NEGATIVE
SARS Coronavirus 2 by RT PCR: NEGATIVE

## 2020-04-10 LAB — MAGNESIUM: Magnesium: 2.2 mg/dL (ref 1.7–2.4)

## 2020-04-10 LAB — ETHANOL: Alcohol, Ethyl (B): <10 mg/dL (ref <10)

## 2020-04-10 MED ORDER — INSULIN ASPART 100 UNIT/ML ~~LOC~~ SOLN
0.0000 [IU] | Freq: Three times a day (TID) | SUBCUTANEOUS | Status: DC
  Filled 2020-04-10: qty 1

## 2020-04-10 MED ORDER — CARVEDILOL 6.25 MG PO TABS: 12.5000 mg | ORAL_TABLET | Freq: Two times a day (BID) | ORAL | Status: DC

## 2020-04-10 MED ORDER — POLYVINYL ALCOHOL-POVIDONE PF 1.4-0.6 % OP SOLN: 1.0000 [drp] | Freq: Two times a day (BID) | OPHTHALMIC | Status: DC

## 2020-04-10 MED ORDER — OMEGA-3 1000 MG PO CAPS: 2.0000 | ORAL_CAPSULE | Freq: Every day | ORAL | Status: DC

## 2020-04-10 MED ORDER — SODIUM CHLORIDE 0.9 % IV SOLN: 250.0000 mL | INTRAVENOUS | Status: DC | PRN

## 2020-04-10 MED ORDER — LEVETIRACETAM 250 MG PO TABS
250.0000 mg | ORAL_TABLET | ORAL | Status: DC
  Administered 2020-04-14 – 2020-04-19 (×3): 250 mg via ORAL
  Filled 2020-04-10 (×4): qty 1

## 2020-04-10 MED ORDER — FLUTICASONE PROPIONATE 50 MCG/ACT NA SUSP
2.0000 | Freq: Every day | NASAL | Status: DC
  Administered 2020-04-11 – 2020-04-20 (×9): 2 via NASAL
  Filled 2020-04-10: qty 16

## 2020-04-10 MED ORDER — IPRATROPIUM BROMIDE 0.06 % NA SOLN: 2.0000 | Freq: Two times a day (BID) | NASAL | Status: DC

## 2020-04-10 MED ORDER — CARVEDILOL 6.25 MG PO TABS
37.5000 mg | ORAL_TABLET | Freq: Two times a day (BID) | ORAL | Status: DC
  Administered 2020-04-10 – 2020-04-20 (×16): 37.5 mg via ORAL
  Filled 2020-04-10 (×17): qty 1

## 2020-04-10 MED ORDER — CARVEDILOL 25 MG PO TABS: 25.0000 mg | ORAL_TABLET | Freq: Two times a day (BID) | ORAL | Status: DC

## 2020-04-10 MED ORDER — SODIUM CHLORIDE 0.9% FLUSH: 3.0000 mL | INTRAVENOUS | Status: DC | PRN

## 2020-04-10 MED ORDER — ATORVASTATIN CALCIUM 20 MG PO TABS
80.0000 mg | ORAL_TABLET | Freq: Every day | ORAL | Status: DC
  Administered 2020-04-10 – 2020-04-19 (×10): 80 mg via ORAL
  Filled 2020-04-10 (×10): qty 4

## 2020-04-10 MED ORDER — TRIAMCINOLONE ACETONIDE 0.1 % EX CREA: TOPICAL_CREAM | Freq: Two times a day (BID) | CUTANEOUS | Status: DC

## 2020-04-10 MED ORDER — OMEGA-3-ACID ETHYL ESTERS 1 G PO CAPS
2.0000 g | ORAL_CAPSULE | Freq: Every day | ORAL | Status: DC
  Administered 2020-04-11 – 2020-04-20 (×9): 2 g via ORAL
  Filled 2020-04-10 (×9): qty 2

## 2020-04-10 MED ORDER — TRIAMCINOLONE ACETONIDE 0.1 % EX CREA
TOPICAL_CREAM | Freq: Two times a day (BID) | CUTANEOUS | Status: DC
  Filled 2020-04-10: qty 15

## 2020-04-10 MED ORDER — ISOSORBIDE MONONITRATE ER 30 MG PO TB24
30.0000 mg | ORAL_TABLET | Freq: Every day | ORAL | Status: DC
  Administered 2020-04-11 – 2020-04-12 (×2): 30 mg via ORAL
  Filled 2020-04-10 (×2): qty 1

## 2020-04-10 MED ORDER — SEVELAMER CARBONATE 800 MG PO TABS: 1600.0000 mg | ORAL_TABLET | Freq: Three times a day (TID) | ORAL | Status: DC

## 2020-04-10 MED ORDER — SEVELAMER CARBONATE 800 MG PO TABS
1600.0000 mg | ORAL_TABLET | Freq: Three times a day (TID) | ORAL | Status: DC
  Administered 2020-04-11 – 2020-04-20 (×20): 1600 mg via ORAL
  Filled 2020-04-10 (×30): qty 2

## 2020-04-10 MED ORDER — BRIMONIDINE TARTRATE 0.2 % OP SOLN
1.0000 [drp] | Freq: Three times a day (TID) | OPHTHALMIC | Status: DC
  Administered 2020-04-10 – 2020-04-20 (×26): 1 [drp] via OPHTHALMIC
  Filled 2020-04-10: qty 5

## 2020-04-10 MED ORDER — LEVETIRACETAM 500 MG PO TABS
500.0000 mg | ORAL_TABLET | Freq: Two times a day (BID) | ORAL | Status: DC
  Administered 2020-04-10 – 2020-04-12 (×5): 500 mg via ORAL
  Filled 2020-04-10 (×7): qty 1

## 2020-04-10 MED ORDER — PANTOPRAZOLE SODIUM 40 MG PO TBEC
40.0000 mg | DELAYED_RELEASE_TABLET | Freq: Every day | ORAL | Status: DC
  Administered 2020-04-11 – 2020-04-20 (×9): 40 mg via ORAL
  Filled 2020-04-10 (×9): qty 1

## 2020-04-10 MED ORDER — ROPINIROLE HCL 0.25 MG PO TABS
0.2500 mg | ORAL_TABLET | Freq: Every day | ORAL | Status: DC
  Administered 2020-04-10 – 2020-04-19 (×10): 0.25 mg via ORAL
  Filled 2020-04-10 (×12): qty 1

## 2020-04-10 MED ORDER — ONDANSETRON HCL 4 MG PO TABS: 4.0000 mg | ORAL_TABLET | Freq: Four times a day (QID) | ORAL | Status: DC | PRN

## 2020-04-10 MED ORDER — POLYVINYL ALCOHOL 1.4 % OP SOLN
1.0000 [drp] | OPHTHALMIC | Status: DC | PRN
  Administered 2020-04-10: 1 [drp] via OPHTHALMIC
  Filled 2020-04-10: qty 15

## 2020-04-10 MED ORDER — MIRTAZAPINE 15 MG PO TABS
7.5000 mg | ORAL_TABLET | Freq: Every day | ORAL | Status: DC
  Administered 2020-04-10 – 2020-04-19 (×11): 7.5 mg via ORAL
  Filled 2020-04-10 (×10): qty 1

## 2020-04-10 MED ORDER — SODIUM CHLORIDE 0.9% FLUSH
3.0000 mL | Freq: Two times a day (BID) | INTRAVENOUS | Status: DC
  Administered 2020-04-10 – 2020-04-20 (×19): 3 mL via INTRAVENOUS

## 2020-04-10 MED ORDER — VALACYCLOVIR HCL 500 MG PO TABS: 500.0000 mg | ORAL_TABLET | Freq: Every day | ORAL | Status: DC

## 2020-04-10 MED ORDER — IPRATROPIUM BROMIDE 0.06 % NA SOLN
2.0000 | Freq: Two times a day (BID) | NASAL | Status: DC
  Administered 2020-04-10 – 2020-04-20 (×19): 2 via NASAL
  Filled 2020-04-10: qty 15

## 2020-04-10 MED ORDER — BRIMONIDINE TARTRATE 0.2 % OP SOLN: 1.0000 [drp] | Freq: Three times a day (TID) | OPHTHALMIC | Status: DC

## 2020-04-10 MED ORDER — TIMOLOL MALEATE 0.5 % OP SOLN: 1.0000 [drp] | Freq: Three times a day (TID) | OPHTHALMIC | Status: DC

## 2020-04-10 MED ORDER — ASPIRIN EC 81 MG PO TBEC
81.0000 mg | DELAYED_RELEASE_TABLET | Freq: Every day | ORAL | Status: DC
  Administered 2020-04-11 – 2020-04-20 (×9): 81 mg via ORAL
  Filled 2020-04-10 (×9): qty 1

## 2020-04-10 MED ORDER — TIMOLOL MALEATE 0.5 % OP SOLN
1.0000 [drp] | Freq: Three times a day (TID) | OPHTHALMIC | Status: DC
  Administered 2020-04-10 – 2020-04-20 (×26): 1 [drp] via OPHTHALMIC
  Filled 2020-04-10 (×2): qty 5

## 2020-04-10 MED ORDER — APIXABAN 2.5 MG PO TABS
2.5000 mg | ORAL_TABLET | Freq: Two times a day (BID) | ORAL | Status: DC
  Administered 2020-04-10 – 2020-04-20 (×19): 2.5 mg via ORAL
  Filled 2020-04-10 (×19): qty 1

## 2020-04-10 MED ORDER — LEVETIRACETAM 500 MG PO TABS: 500.0000 mg | ORAL_TABLET | Freq: Two times a day (BID) | ORAL | Status: DC

## 2020-04-10 MED ORDER — SODIUM CHLORIDE 0.9% FLUSH
3.0000 mL | Freq: Two times a day (BID) | INTRAVENOUS | Status: DC
  Administered 2020-04-10 – 2020-04-19 (×7): 3 mL via INTRAVENOUS

## 2020-04-10 MED ORDER — ASPIRIN EC 81 MG PO TBEC
81.0000 mg | DELAYED_RELEASE_TABLET | Freq: Every day | ORAL | Status: DC
Start: 2020-04-10 — End: 2020-04-10

## 2020-04-10 MED ORDER — ONDANSETRON HCL 4 MG/2ML IJ SOLN: 4.0000 mg | Freq: Four times a day (QID) | INTRAMUSCULAR | Status: DC | PRN

## 2020-04-10 MED ORDER — HYDRALAZINE HCL 50 MG PO TABS
50.0000 mg | ORAL_TABLET | Freq: Three times a day (TID) | ORAL | Status: DC
  Administered 2020-04-10 – 2020-04-13 (×8): 50 mg via ORAL
  Filled 2020-04-10 (×7): qty 1

## 2020-04-10 MED ORDER — VALACYCLOVIR HCL 500 MG PO TABS
500.0000 mg | ORAL_TABLET | Freq: Every day | ORAL | Status: DC
  Administered 2020-04-11 – 2020-04-19 (×6): 500 mg via ORAL
  Filled 2020-04-10 (×10): qty 1

## 2020-04-10 MED ORDER — VITAMIN B-12 1000 MCG PO TABS
1000.0000 ug | ORAL_TABLET | Freq: Every day | ORAL | Status: DC
  Administered 2020-04-11 – 2020-04-20 (×9): 1000 ug via ORAL
  Filled 2020-04-10 (×8): qty 1

## 2020-04-10 MED ORDER — VITAMIN B-12 1000 MCG PO TABS: 1000.0000 ug | ORAL_TABLET | Freq: Every day | ORAL | Status: DC

## 2020-04-10 MED ORDER — ISOSORBIDE MONONITRATE ER 60 MG PO TB24: 30.0000 mg | ORAL_TABLET | Freq: Every day | ORAL | Status: DC

## 2020-04-10 MED ORDER — FLUTICASONE PROPIONATE 50 MCG/ACT NA SUSP: 2.0000 | Freq: Every day | NASAL | Status: DC

## 2020-04-10 MED ORDER — MECLIZINE HCL 25 MG PO TABS
25.0000 mg | ORAL_TABLET | ORAL | Status: DC | PRN
  Filled 2020-04-10: qty 1

## 2020-04-10 MED ORDER — APIXABAN 2.5 MG PO TABS: 2.5000 mg | ORAL_TABLET | Freq: Two times a day (BID) | ORAL | Status: DC

## 2020-04-10 NOTE — ED Provider Notes (Signed)
Patient noted by nursing staff to have fallen out of bed.  I assisted in getting her back into the bed.  She denies any pain or injury.  She is fully awake and alert.  Nursing will notify attending of record Dr. Francine Graven of the fall, I have also sent her a message to notify her of this incident occurring.   Delman Kitten, MD 04/10/20 [phone]

## 2020-04-10 NOTE — ED Provider Notes (Signed)
[facility] Emergency Department Provider Note   ____________________________________________   Event Date/Time   First MD Initiated Contact with Patient 04/10/20 1109     (approximate)  I have reviewed the triage vital signs and the nursing notes.   HISTORY  Chief Complaint Altered Mental Status (Per EMS; AMS per family. LKN unknown. HD is due today but EMS unsure if missed any. EMS BS 204)    HPI Stephanie Ellis is a 69 y.o. female with past medical history of hypertension, diabetes, ESRD on HD (MWF), PAD status post BKA, and CAD status post CABG who presents to the ED for altered mental status.  61 of history is obtained from patient's daughter, who states the patient has not been acting like her usual self ever since she was discharged from the hospital 5 days ago.  Patient was initially assessed for chest pain in the ED 8 days ago, received a dose of IV Dilaudid and shortly afterwards and went apneic with brief PEA arrest versus periarrest.  She recovered immediately after a dose of Narcan, was admitted to the hospital remainder of work-up unremarkable.  Daughter states that patient has been acting erratically since then with slurred speech and "acting like she has taken something."  Daughter also states that patient has had a hard time moving her extremities since leaving the hospital.  She then refused to go to her usual dialysis earlier this morning.  Patient currently denies any complaints, specifically denies fever, cough, chest pain, shortness of breath.  She does not make urine.        Past Medical History:  Diagnosis Date  . (HFpEF) heart failure with preserved ejection fraction ([place])    a. 01/2018 Echo: EF 50-55%, no rwma, Gr1 DD, triv AI, mild MR. Midly dil LA/RV, mod reduced RV fxn. Irreg thickening of TV w/ mobile echodensity that appears to arise from valve.  . Anemia   . Anorexia   . Bacteremia due to Pseudomonas   . Carotid arterial  disease ([place])    a. 01/2017 Carotid U/S: RICA <57, LICA <84.  Marland Kitchen CHF (congestive heart failure) ([place])   . Complication of anesthesia    a. Pt reports h/o complication on 9 different occasions - ? hypotension/arrest.  . 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 01/2015 Cath [facility]): 70% LM, p-mLAD 50-60% (Resting FFR 0.75), mRCA 80-90%, ~40 Ost OM & D1-->CABG; b. 01/2016 Staged PCI of LCX x 2 and RCA.  Marland Kitchen ESRD (end stage renal disease) on dialysis ([place])    a. ESRD secondary to acute kidney failure s/p CABG-->PD  . Essential hypertension   . GERD (gastroesophageal reflux disease)   . Heart murmur   . Hypercholesterolemia   . Myocardial infarction ([place]) 2016  . PVD (peripheral vascular disease) ([place])    a. 01/30/2018 PV Angio: Sev Left Popliteal dzs s/p PTA w/ 81m lutonix DEB w/ mech aspiration of the L popliteal, L AT, and Left tibioperoneal trunck and peroneal artery.  . 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  [facility]: Dr. BMarland KitchenHaithcock: CABG x 3, LIMA to LAD, SVG to RCA, SVG to OM3, EVH  . Sinusitis 2019  . Type II diabetes mellitus with complication ([facility]    CAD    Patient Active Problem List   Diagnosis Date Noted  . Chronic combined systolic and diastolic CHF (congestive heart failure) ([place] 04/02/2020  . Thrombocytopenia ([place] 04/02/2020  . Chronic respiratory failure with hypoxia (HCC)  11/03/2019  . Nocturnal hypoxia   . Shortness of breath   . Abdominal swelling   . Acute on chronic systolic CHF (congestive heart failure) ([place])   . Acute respiratory failure with hypoxia ([place]) 09/02/2019  . Acute on chronic combined systolic (congestive) and diastolic (congestive) heart failure ([place]) 09/02/2019  . 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 Severe anemia 05/01/2019  . Pneumonia due to COVID-19 virus 05/01/2019  . Resides in skilled nursing facility 12/29/2018  . CVA (cerebral vascular accident) ([place]) 07/08/2018  . Infection of amputation stump ([place]) 05/02/2018  .  BKA stump complication ([place]) 20/25/4270  . Unilateral complete BKA, right, initial encounter ([place]) 03/30/2018  . Transient loss of consciousness 03/19/2018  . Transient alteration of awareness   . VT (ventricular tachycardia) ([place])   . Disorientation   . Hypertensive urgency 03/08/2018  . Status post below-knee amputation of both lower extremities ([place]) 03/02/2018  . Ischemia of extremity 02/18/2018  . CKD (chronic kidney disease) stage 5, GFR less than 15 ml/min (HCC) 02/16/2018  . Diabetic osteomyelitis ([place])   . Peripheral vascular disease ([place])   . Advanced care planning/counseling discussion   . Palliative care by specialist   . Goals of care, counseling/discussion   . Type 2 diabetes mellitus with hyperlipidemia ([place]) 01/30/2018  . Sepsis ([place]) 01/29/2018  . Peripheral neuropathy 06/17/2017  . Seizure disorder ([place]) 02/06/2017  . Accelerated hypertension 02/06/2017  . Endophthalmitis, left eye 12/05/2016  . Vomiting 10/24/2016  . Hematuria 10/17/2016  . Acute lower UTI 10/17/2016  . Anesthesia complication 62/37/6283  . Chest pain 09/04/2016  . Steal syndrome dialysis vascular access, initial encounter ([place]) 06/18/2016  . 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 Coronary artery disease involving coronary bypass graft of native heart with angina pectoris ([place]) 02/10/2016  . Hypertension 02/02/2016  . Hyperlipidemia 02/02/2016  . Coronary artery disease involving left main coronary artery 10/04/2015  . Abdominal pain of unknown etiology 10/04/2015  . ESRD (end stage renal disease) on dialysis ([place])   . Type II diabetes mellitus with complication ([place])   . NSTEMI (non-ST elevated myocardial infarction) ([place])   . Right sided abdominal pain   . Elevated troponin 10/03/2015  . 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 Acute on chronic combined systolic and diastolic CHF (congestive heart failure) ([place]) 04/19/2014  . Anemia 09/20/2013  . Routine health maintenance  01/29/2013    Past Surgical History:  Procedure Laterality Date  . ABDOMINAL HYSTERECTOMY    . AMPUTATION Right 02/25/2018   Procedure: AMPUTATION BELOW KNEE;  Surgeon: Katha Cabal, MD;  Location: [facility] ORS;  Service: Vascular;  Laterality: Right;  . AMPUTATION Left 03/11/2018   Procedure: AMPUTATION BELOW KNEE;  Surgeon: Algernon Huxley, MD;  Location: [facility] ORS;  Service: Vascular;  Laterality: Left;  . AMPUTATION Bilateral 04/13/2018   Procedure: REVISION OF BILATERAL AMPUTATIONS BELOW KNEE WITH WOUND VAC APPLICATIONS;  Surgeon: Evaristo Bury, MD;  Location: [facility] ORS;  Service: Vascular;  Laterality: Bilateral;  . AMPUTATION TOE Left 02/06/2018   Procedure: AMPUTATION GREAT TOE;  Surgeon: Samara Deist, DPM;  Location: [facility] OR[facility];  Service: Podiatry;  Laterality: Left;  . APPLICATION OF WOUND VAC Left 04/13/2018   Procedure: APPLICATION OF WOUND VAC;  Surgeon: Evaristo Bury, MD;  Location: [facility];  Service: Vascular;  Laterality: Left;  . ARTERIOVENOUS GRAFT PLACEMENT  05/2016  . AV FISTULA PLACEMENT Left 05/30/2016   Procedure: ARTERIOVENOUS graft;  Surgeon: Algernon Huxley, MD;  Location: [facility];  Service: Vascular;  Laterality: Left;  . CAPD INSERTION N/A 10/30/2017   Procedure: LAPAROSCOPIC INSERTION CONTINUOUS AMBULATORY PERITONEAL DIALYSIS  (CAPD) CATHETER;  Surgeon: Algernon Huxley, MD;  Location: [facility];  Service: Vascular;  Laterality: N/A;  . CARDIAC CATHETERIZATION  01/2015   [facility]: Ost LM 70%, p-m LAD 50-60% (Rest FFR + @ 0.75), mRCA 80-90%, ostD1 40%, pOM1 40%  . CATARACT EXTRACTION W/PHACO Left 01/18/2016   Procedure: CATARACT EXTRACTION PHACO AND INTRAOCULAR LENS PLACEMENT (IOC);  Surgeon: Eulogio Bear, MD;  Location: [facility];  Service: Ophthalmology;  Laterality: Left;  [place] 1.05 AP% 15.5 CDE 10.16 Fluid Pack Lot # [id number]  . CATARACT EXTRACTION W/PHACO Right 08/01/2016   Procedure: CATARACT EXTRACTION PHACO AND INTRAOCULAR LENS PLACEMENT (IOC);  Surgeon: Eulogio Bear, MD;  Location: [facility];  Service: Ophthalmology;  Laterality: Right;  [place] 01:00.6 AP% 11.4 CDE 6.93  LOT # [id number]  . COLONOSCOPY    . CORONARY ANGIOPLASTY     SENTS 02/12/16  . CORONARY ARTERY BYPASS GRAFT  03/28/15    [facility]: Dr. Waldemar Dickens: LIMA to LAD, SVG to RCA, SVG to OM3, EVH  . DIALYSIS/PERMA CATHETER INSERTION N/A 02/11/2018   Procedure: DIALYSIS/PERMA CATHETER INSERTION;  Surgeon: Algernon Huxley, MD;  Location: [place] [facility];  Service: Cardiovascular;  Laterality: N/A;  . DIALYSIS/PERMA CATHETER REMOVAL Right 02/04/2018   Procedure: DIALYSIS/PERMA CATHETER REMOVAL;  Surgeon: Katha Cabal, MD;  Location: [place] [facility];  Service: Cardiovascular;  Laterality: Right;  . ESOPHAGOGASTRODUODENOSCOPY (EGD) WITH PROPOFOL N/A 10/24/2016   Procedure: ESOPHAGOGASTRODUODENOSCOPY (EGD) WITH PROPOFOL;  Surgeon: [name], MD;  Location: [facility];  Service: Endoscopy;  Laterality: N/A;  . EYE SURGERY Bilateral    cataract surgery  . EYE SURGERY     drains for glaucoma  . INSERTION EXPRESS TUBE SHUNT Right 08/01/2016   Procedure: INSERTION EXPRESS TUBE SHUNT;  Surgeon: Eulogio Bear, MD;  Location: [facility];  Service: Ophthalmology;  Laterality: Right;  . INSERTION OF AHMED VALVE Left 08/15/2016   Procedure: INSERTION OF AHMED VALVE;  Surgeon: Eulogio Bear, MD;  Location: [facility];  Service: Ophthalmology;  Laterality: Left;  . INSERTION OF DIALYSIS CATHETER    . LOWER EXTREMITY ANGIOGRAPHY Right 02/19/2018   Procedure: Lower Extremity Angiography;  Surgeon: Algernon Huxley, MD;  Location: [place] [facility];  Service: Cardiovascular;  Laterality: Right;  . LOWER EXTREMITY ANGIOGRAPHY Left 01/30/2018   Procedure: Lower Extremity Angiography;  Surgeon: Katha Cabal, MD;  Location: [place] [facility];  Service: Cardiovascular;  Laterality: Left;  . REMOVAL OF A DIALYSIS CATHETER N/A 02/25/2018   Procedure: REMOVAL OF A DIALYSIS CATHETER;  Surgeon:  Katha Cabal, MD;  Location: [facility];  Service: Vascular;  Laterality: N/A;  . TEMPORARY DIALYSIS CATHETER N/A 02/06/2018   Procedure: TEMPORARY DIALYSIS CATHETER;  Surgeon: Katha Cabal, MD;  Location: [place] [facility];  Service: Cardiovascular;  Laterality: N/A;  . TRANSTHORACIC ECHOCARDIOGRAM  January 2017    EF 60-65%. GR 2 DD. Mild degenerative mitral valve disease but no prolapse or regurgitation. Mild left atrial dilation. Mild to moderate LVH. Pericardial effusion gone  . UPPER EXTREMITY ANGIOGRAPHY Left 06/27/2016   Procedure: Upper Extremity Angiography;  Surgeon: Algernon Huxley, MD;  Location: [place] [facility];  Service: Cardiovascular;  Laterality: Left;  . UPPER EXTREMITY ANGIOGRAPHY Left 08/22/2016   Procedure: Upper Extremity Angiography;  Surgeon: Algernon Huxley, MD;  Location: [place] [facility];  Service:  Cardiovascular;  Laterality: Left;  Marland Kitchen VASCULAR SURGERY    . WOUND DEBRIDEMENT Left 05/08/2018   Procedure: DEBRIDEMENT OF LEFT LEG BKA;  Surgeon: Algernon Huxley, MD;  Location: [facility];  Service: General;  Laterality: Left;    Prior to Admission medications   Medication Sig Start Date End Date Taking? Authorizing Provider  apixaban (ELIQUIS) 2.5 MG TABS tablet Take 1 tablet (2.5 mg total) by mouth 2 (two) times daily. 02/15/18   Vaughan Basta, MD  aspirin EC 81 MG tablet Take 1 tablet (81 mg total) by mouth daily. 10/19/16   Dustin Flock, MD  atorvastatin (LIPITOR) 80 MG tablet Take 80 mg by mouth at bedtime.  05/05/17   [provider]  brimonidine (ALPHAGAN) 0.2 % ophthalmic solution Place 1 drop into both eyes 3 (three) times daily. 11/05/19   Ezekiel Slocumb, DO  carvedilol (COREG) 12.5 MG tablet Take 12.5 mg by mouth 2 (two) times daily. Take in combination with 25 mg for total of 37.5 mg twice daily 02/24/20   [provider]  carvedilol (COREG) 25 MG tablet Take 1 tablet (25 mg total) by mouth 2 (two) times daily. 07/13/18   Stark Jock  Jude, MD  cloNIDine (CATAPRES) 0.1 MG tablet Take 0.1 mg by mouth daily as needed (BP >160/80).    [provider]  fluticasone (FLONASE) 50 MCG/ACT nasal spray Place 2 sprays into both nostrils daily.    [provider]  hydrALAZINE (APRESOLINE) 50 MG tablet Take 1 tablet (50 mg total) by mouth every 8 (eight) hours. 11/05/19   Nicole Kindred A, DO  insulin aspart (NOVOLOG) 100 UNIT/ML injection Inject 0-6 Units into the skin See admin instructions. Inject under the skin according to blood glucose reading:  <201: 0u 201- 250: 2u 251-300: 4u 301-350: 5u 351-400: 6u    [provider]  insulin glargine (LANTUS) 100 UNIT/ML injection Inject 5 Units into the skin daily in the afternoon.     [provider]  ipratropium (ATROVENT) 0.06 % nasal spray Place 2 sprays into both nostrils 2 (two) times daily. 09/05/19   Loletha Grayer, MD  isosorbide mononitrate (IMDUR) 30 MG 24 hr tablet Take 30 mg by mouth daily.     [provider]  levETIRAcetam (KEPPRA) 250 MG tablet Three times weekly after dialysis Patient taking differently: Take 250 mg by mouth Every Tuesday,Thursday,and Saturday with dialysis.  05/12/18   Loletha Grayer, MD  levETIRAcetam (KEPPRA) 500 MG tablet Take 500 mg by mouth 2 (two) times daily. 02/24/20   [provider]  meclizine (ANTIVERT) 25 MG tablet Take 25 mg by mouth every 4 (four) hours as needed for dizziness.    [provider]  mirtazapine (REMERON) 15 MG tablet Take 7.5 mg by mouth daily.     [provider]  Omega-3 1000 MG CAPS Take 2 capsules by mouth daily.     [provider]  pantoprazole (PROTONIX) 40 MG tablet Take 1 tablet (40 mg total) by mouth daily. 06/04/19   [name], MD  Polyvinyl Alcohol-Povidone PF (REFRESH) 1.4-0.6 % SOLN Place 1 drop into both eyes 2 (two) times a day.     [provider]  rOPINIRole (REQUIP) 0.25 MG tablet Take 0.25 mg by mouth at bedtime.     [provider]  sevelamer carbonate (RENVELA) 800 MG tablet 2 Tablet(s) By Mouth 3 Times Daily    [provider]  timolol (TIMOPTIC) 0.5 % ophthalmic solution Place 1 drop into both eyes  3 (three) times daily. 03/18/18   Bettey Costa, MD  traMADol (ULTRAM) 50 MG tablet Take 1 tablet (50 mg total) by mouth every 12 (twelve) hours as needed for up to 5 days for moderate pain or severe pain. 04/06/20 04/11/20  Sidney Ace, MD  triamcinolone cream (KENALOG) 0.1 % Apply topically. 11/01/19   [provider]  valACYclovir (VALTREX) 500 MG tablet Take 1 tablet (500 mg total) by mouth daily for 6 days. 04/06/20 04/12/20  Sidney Ace, MD  vitamin B-12 (CYANOCOBALAMIN) 1000 MCG tablet Take 1 tablet (1,000 mcg total) by mouth daily. 02/15/18   Vaughan Basta, MD    Allergies Chlorthalidone, Dilaudid [hydromorphone hcl], Fentanyl, Midazolam, Morphine and related, Ace inhibitors, Angiotensin receptor blockers, Norvasc [amlodipine], and Phenergan [promethazine hcl]  Family History  Problem Relation Age of Onset  . Diabetes Mellitus II Mother   . Heart failure Mother   . Pancreatic cancer Father     Social History Social History   Tobacco Use  . Smoking status: Never Smoker  . Smokeless tobacco: Never Used  Vaping Use  . Vaping Use: Never used  Substance Use Topics  . Alcohol use: No  . Drug use: No    Review of Systems  Constitutional: No fever/chills.  Positive for confusion. Eyes: No visual changes. ENT: No sore throat. Cardiovascular: Denies chest pain. Respiratory: Denies shortness of breath. Gastrointestinal: No abdominal pain.  No nausea, no vomiting.  No diarrhea.  No constipation. Genitourinary: Negative for dysuria. Musculoskeletal: Negative for back pain. Skin: Negative for rash. Neurological: Negative for headaches, focal weakness or numbness.  ____________________________________________   PHYSICAL EXAM:  VITAL  SIGNS: ED Triage Vitals [04/10/20 1106]  Enc Vitals Group     BP (!) 198/85     Pulse Rate 68     Resp 18     Temp 98.2 F (36.8 C)     Temp Source Oral     SpO2 97 %     Weight 110 lb 3.7 oz (50 kg)     Height 4' 4"  (1.321 m)     Head Circumference      Peak Flow      Pain Score 0     Pain Loc      Pain Edu?      Excl. in [place]?     Constitutional: Alert and oriented. Eyes: Conjunctivae are normal.  Irregular pupils bilaterally secondary to previous surgery. Head: Atraumatic. Nose: No congestion/rhinnorhea. Mouth/Throat: Mucous membranes are moist. Neck: Normal ROM Cardiovascular: Normal rate, regular rhythm. Grossly normal heart sounds.  Left IJ TDC in place, site clean, dry, and intact. Respiratory: Normal respiratory effort.  No retractions. Lungs CTAB. Gastrointestinal: Soft and nontender. No distention. Genitourinary: deferred Musculoskeletal: No lower extremity tenderness nor edema.  Status post bilateral BKA. Neurologic: Slurred speech noted. No gross focal neurologic deficits are appreciated.  Spastic movements noted in bilateral upper extremities. Skin:  Skin is warm, dry and intact. No rash noted. Psychiatric: Mood and affect are normal. Speech and behavior are normal.  ____________________________________________   LABS (all labs ordered are listed, but only abnormal results are displayed)  Labs Reviewed  CBC WITH DIFFERENTIAL/PLATELET - Abnormal; Notable for the following components:      Result Value   RDW 16.7 (*)    Platelets 113 (*)    All other components within normal limits  COMPREHENSIVE METABOLIC PANEL - Abnormal; Notable for the following components:   Chloride 97 (*)    Glucose, Bld 186 (*)  BUN 53 (*)    Creatinine, Ser 9.33 (*)    GFR, Estimated 4 (*)    All other components within normal limits  CBG MONITORING, ED - Abnormal; Notable for the following components:   Glucose-Capillary 171 (*)    All other components within normal limits   RESP PANEL BY RT-PCR (FLU A&B, COVID) ARPGX2  ETHANOL  MAGNESIUM  AMMONIA   ____________________________________________  EKG  ED ECG REPORT I, Blake Divine, the attending physician, personally viewed and interpreted this ECG.   Date: 04/10/2020  EKG Time: 11:03  Rate: 69  Rhythm: normal sinus rhythm  Axis: LAD  Intervals:nonspecific intraventricular conduction delay and prolonged QT  ST&T Change: T wave inversions laterally, similar to previous   PROCEDURES  Procedure(s) performed (including Critical Care):  Procedures   ____________________________________________   INITIAL IMPRESSION / ASSESSMENT AND PLAN / ED COURSE       69 year old female with past medical history of hypertension, diabetes, ESRD on HD (MWF), PAD status post BKA, and CAD status post CABG who presents to the ED for altered mental status since leaving the hospital 5 days ago.  Patient alert and oriented on my assessment, but noted to have slurred speech with spastic movements in upper extremities.  We will further assess with CT head, if she were to have had a stroke she is outside the window for any intervention given onset greater than 5 days ago.  Vital signs are reassuring and I have a low suspicion for infectious source of her symptoms, she does not make urine.  EKG shows no evidence of arrhythmia or ischemia.  CT head negative for acute process, chest x-ray reviewed by me and shows no focal infiltrate, edema, or effusion.  There is questionable pneumonia per radiology, but given patient's lack of fever, cough, shortness of breath, or leukocytosis I have a low suspicion for pneumonia at this time.  We will hold off on antibiotics.  Patient continues to appear confused with spastic arm movements and slurred speech.  Family requesting admission and consideration for placement, case discussed with hospitalist for admission.      ____________________________________________   FINAL CLINICAL  IMPRESSION(S) / ED DIAGNOSES  Final diagnoses:  Altered mental status, unspecified altered mental status type  Slurred speech     ED Discharge Orders    None       Note:  This document was prepared using Dragon voice recognition software and may include unintentional dictation errors.   Blake Divine, MD 04/10/20 1300

## 2020-04-10 NOTE — Progress Notes (Signed)
[facility]  ROUNDING NOTE   Subjective:   [name] was admitted to [facility] on 04/10/2020 for AMS (altered mental status) [R41.82]  Not able to provide details but states she is feeling better   Objective:  Vital signs in last 24 hours:  Temp:  [98.2 F (36.8 C)-98.4 F (36.9 C)] 98.4 F (36.9 C) (12/20 1351) Pulse Rate:  [67-77] 77 (12/20 1351) Resp:  [17-18] 18 (12/20 1351) BP: (180-198)/(76-95) 180/90 (12/20 1351) SpO2:  [95 %-98 %] 98 % (12/20 1351) Weight:  [50 kg] 50 kg (12/20 1106)  Weight change:  Filed Weights   04/10/20 1106  Weight: 50 kg    Intake/Output: No intake/output data recorded.   Intake/Output this shift:  No intake/output data recorded.  Physical Exam: General:  Resting in bed, in no acute distress  Head:  Oral mucous membranes moist  Eyes:  Sclerae and conjunctivae clear  Lungs:  Respirations symmetrical and unlabored, lungs clear  Heart:  S1-S2, no rubs or gallops  Abdomen:  Soft, nontender, nondistended  Extremities:  bilateral BKA, no edema noted in upper legs  Neurologic:  Awake, alert, speech clear and appropriate     Access: [facility] permcath    Basic Metabolic Panel: Recent Labs  Lab 04/05/20 1003 04/10/20 1127 04/10/20 1129  NA 135  135 140  --   K 4.7  5.0 4.8  --   CL 98  96* 97*  --   CO2 25  27 28   --   GLUCOSE 178*  181* 186*  --   BUN 41*  42* 53*  --   CREATININE 7.86*  7.89* 9.33*  --   CALCIUM 8.9  8.9 8.9  --   MG  --   --  2.2  PHOS 4.6  --   --     Liver Function Tests: Recent Labs  Lab 04/05/20 1003 04/10/20 1127  AST 16 17  ALT 17 13  ALKPHOS 91 92  BILITOT 0.6 0.6  PROT 6.8 7.4  ALBUMIN 3.4*  3.4* 3.7   No results for input(s): LIPASE, AMYLASE in the last 168 hours. Recent Labs  Lab 04/10/20 1129  AMMONIA 19    CBC: Recent Labs  Lab 04/04/20 0459 04/05/20 0414 04/10/20 1127  WBC 5.5 5.6 4.2  NEUTROABS  --   --  3.0  HGB 12.0 12.2 13.4  HCT 39.8 39.7 43.2   MCV 100.0 99.0 96.4  PLT 98* 81* 113*    Cardiac Enzymes: No results for input(s): CKTOTAL, CKMB, CKMBINDEX, TROPONINI in the last 168 hours.  BNP: Invalid input(s): POCBNP  CBG: Recent Labs  Lab 04/05/20 1556 04/05/20 2000 04/06/20 0845 04/06/20 1200 04/10/20 1106  GLUCAP 124* 313* 178* 164* 171*    Microbiology: Results for orders placed or performed during the hospital encounter of 04/02/20  Resp Panel by RT-PCR (Flu A&B, Covid) Nasopharyngeal Swab     Status: None   Collection Time: 04/02/20  2:25 PM   Specimen: Nasopharyngeal Swab; Nasopharyngeal(NP) swabs in vial transport medium  Result Value Ref Range Status   SARS Coronavirus 2 by RT PCR NEGATIVE NEGATIVE Final    Comment: (NOTE) SARS-CoV-2 target nucleic acids are NOT DETECTED.  The SARS-CoV-2 RNA is generally detectable in upper respiratory specimens during the acute phase of infection. The lowest concentration of SARS-CoV-2 viral copies this assay can detect is 138 copies/mL. A negative result does not preclude SARS-Cov-2 infection and should not be used as the sole basis for treatment  or other patient management decisions. A negative result may occur with  improper specimen collection/handling, submission of specimen other than nasopharyngeal swab, presence of viral mutation(s) within the areas targeted by this assay, and inadequate number of viral copies(<138 copies/mL). A negative result must be combined with clinical observations, patient history, and epidemiological information. The expected result is Negative.  Fact Sheet for Patients:  EntrepreneurPulse.com.au  Fact Sheet for Healthcare Providers:  IncredibleEmployment.be  This test is no t yet approved or cleared by the [place] FDA and  has been authorized for detection and/or diagnosis of SARS-CoV-2 by FDA under an Emergency Use Authorization (EUA). This EUA will remain  in effect (meaning this test  can be used) for the duration of the COVID-19 declaration under Section 564(b)(1) of the Act, 21 U.S.C.section 360bbb-3(b)(1), unless the authorization is terminated  or revoked sooner.       Influenza A by PCR NEGATIVE NEGATIVE Final   Influenza B by PCR NEGATIVE NEGATIVE Final    Comment: (NOTE) The Xpert Xpress SARS-CoV-2/FLU/RSV plus assay is intended as an aid in the diagnosis of influenza from Nasopharyngeal swab specimens and should not be used as a sole basis for treatment. Nasal washings and aspirates are unacceptable for Xpert Xpress SARS-CoV-2/FLU/RSV testing.  Fact Sheet for Patients: EntrepreneurPulse.com.au  Fact Sheet for Healthcare Providers: IncredibleEmployment.be  This test is not yet approved or cleared by the [place] FDA and has been authorized for detection and/or diagnosis of SARS-CoV-2 by FDA under an Emergency Use Authorization (EUA). This EUA will remain in effect (meaning this test can be used) for the duration of the COVID-19 declaration under Section 564(b)(1) of the Act, 21 U.S.C. section 360bbb-3(b)(1), unless the authorization is terminated or revoked.  Performed at [facility], [address] [place], [place] [address]   MRSA PCR Screening     Status: None   Collection Time: 04/04/20  6:00 AM   Specimen: Nasopharyngeal  Result Value Ref Range Status   MRSA by PCR NEGATIVE NEGATIVE Final    Comment:        The GeneXpert MRSA Assay (FDA approved for NASAL specimens only), is one component of a comprehensive MRSA colonization surveillance program. It is not intended to diagnose MRSA infection nor to guide or monitor treatment for MRSA infections. Performed at [facility], [address] [place], [place] [address]     Coagulation Studies: No results for input(s): LABPROT, INR in the last 72 hours.  Urinalysis: No results for input(s): COLORURINE, LABSPEC, PHURINE,  GLUCOSEU, HGBUR, BILIRUBINUR, KETONESUR, PROTEINUR, UROBILINOGEN, NITRITE, LEUKOCYTESUR in the last 72 hours.  Invalid input(s): APPERANCEUR    Imaging: DG Chest 2 View  Result Date: 04/10/2020 CLINICAL DATA:  Altered mental status EXAM: CHEST - 2 VIEW COMPARISON:  April 02, 2020 FINDINGS: Central catheter tip is in the right atrium, slightly distal to the cavoatrial junction. No pneumothorax. There is a small area of ill-defined opacity in the lateral right base. Lungs elsewhere are clear. There is cardiomegaly with pulmonary vascular normal. Patient is status post coronary artery bypass grafting. There is aortic atherosclerosis. No bone lesions. IMPRESSION: Central catheter as described without pneumothorax. Small area of suspected pneumonia lateral right base. Lungs elsewhere clear. There is cardiomegaly with postoperative coronary artery bypass grafting change. Aortic Atherosclerosis (ICD10-I70.0). Electronically Signed   By: Lowella Grip III M.D.   On: 04/10/2020 12:19   CT Head Wo Contrast  Result Date: 04/10/2020 CLINICAL DATA:  Altered mental status EXAM: CT HEAD  WITHOUT CONTRAST TECHNIQUE: Contiguous axial images were obtained from the base of the skull through the vertex without intravenous contrast. COMPARISON:  05/01/2019 FINDINGS: Brain: No evidence of acute infarction, hemorrhage, hydrocephalus, extra-axial collection or mass lesion/mass effect. Scattered low-density changes within the periventricular and subcortical white matter compatible with chronic microvascular ischemic change. Mild diffuse cerebral volume loss. Vascular: Atherosclerotic calcifications involving the large vessels of the skull base. No unexpected hyperdense vessel. Skull: Normal. Negative for fracture or focal lesion. Sinuses/Orbits: Chronic mucosal thickening within the left sphenoid sinus and posterior left ethmoid air cells. Remaining paranasal sinuses and mastoid air cells are clear. Other: None.  IMPRESSION: 1. No acute intracranial findings. 2. Chronic microvascular ischemic change and cerebral volume loss. Electronically Signed   By: Davina Poke D.O.   On: 04/10/2020 12:29     Medications:    Scheduled Meds: . apixaban  2.5 mg Oral BID  . aspirin EC  81 mg Oral Daily  . atorvastatin  80 mg Oral QHS  . brimonidine  1 drop Both Eyes TID  . carvedilol  37.5 mg Oral BID WC  . fluticasone  2 spray Each Nare Daily  . hydrALAZINE  50 mg Oral Q8H  . insulin aspart  0-6 Units Subcutaneous TID WC  . ipratropium  2 spray Each Nare BID  . isosorbide mononitrate  30 mg Oral Daily  . levETIRAcetam  500 mg Oral BID  . mirtazapine  7.5 mg Oral QHS  . Omega-3  2 capsule Oral Daily  . [START ON 04/11/2020] pantoprazole  40 mg Oral Daily  . Polyvinyl Alcohol-Povidone PF  1 drop Both Eyes BID  . rOPINIRole  0.25 mg Oral QHS  . sevelamer carbonate  1,600 mg Oral TID WC  . sodium chloride flush  3 mL Intravenous Q12H  . sodium chloride flush  3 mL Intravenous Q12H  . timolol  1 drop Both Eyes TID  . triamcinolone   Topical BID  . valACYclovir  500 mg Oral Daily  . vitamin B-12  1,000 mcg Oral Daily   Continuous Infusions: . sodium chloride     PRN Meds:.sodium chloride, meclizine, ondansetron **OR** ondansetron (ZOFRAN) IV, sodium chloride flush    Assessment/ Plan:  [name] is a 69 y.o. black female with end stage renal disease on hemodialysis, peripheral vascular disease status post bilateral BKA, diabetes mellitus type II, coronary artery disease status post CABG admitted to [facility] on 04/10/2020 for AMS (altered mental status) [R41.82]  [facility] MWF [facility] permcath [phone]  #  End Stage Renal Disease We will continue MWF schedule   # Anemia of chronic kidney disease Lab Results  Component Value Date   HGB 13.4 04/10/2020   No indication for Epogen  #Secondary Hyperparathyroidism:  Lab Results  Component Value Date   PTH 385 (H)  09/03/2019   CALCIUM 8.9 04/10/2020   PHOS 4.6 04/05/2020  Will continue monitoring bone mineral metabolism parameters  #Shingles   Patient is on valacyclovir     LOS: 0 Romyn Boswell 12/20/20212:06 PM

## 2020-04-10 NOTE — ED Notes (Signed)
Attending notified of event. New order for 1:1 sitter noted at this time.

## 2020-04-10 NOTE — ED Notes (Signed)
Pt found on floor, was turning in a circle on buttocks entangling herself in wires. Pt neuro status is baseline. No c/o pain. ED physician at bedside. Pt assisted back into bed with assist x2.

## 2020-04-10 NOTE — H&P (Addendum)
History and Physical    Stephanie Ellis BZJ:[id number] DOB: June 24, 1950 DOA: 04/10/2020  PCP: Pcp, No   Patient coming from: Home  I have personally briefly reviewed patient's old medical records in [place]  Chief Complaint: Altered mental status  Most of the history was obtained from her daughter, Stephanie Ellis over the phone  HPI: Stephanie Ellis is a 69 y.o. female with medical history significant for diabetes mellitus with complications of ESRD on HD (M/W/F) , hypertension, PAD s/p bilateral BKA, coronary artery disease status post CABG who was brought to the emergency room by EMS for evaluation of mental status changes.  Patient was recently discharged from the hospital on 04/06/20.  During that hospitalization she had a brief PEA arrest after she received hydromorphone and recovered immediately following administration of Narcan.  Her daughter states that since her discharge she has not been her usual self and has been acting very erratic.  At baseline patient is able to take care of herself and thyroid activities of daily living such as transfers, grooming and feeding herself.  Since her discharge she has been very weak and has been unable to feed herself or transfer to her wheelchair.  Family is also concerned that her speech is slurred.  Patient refused to go to dialysis on the morning of her admission and so EMS was called. I am unable to do a review of systems on this patient and she answers no to all questions.  She denies having any constitutional symptoms. Labs show sodium 140, potassium 4.5, chloride 97, bicarb 28, glucose 186, BUN 53, creatinine 9.3, calcium 8.9, magnesium 2.2, alkaline phosphatase 92, albumin 3.7, AST 17, ALT 13, total protein 7.4, white count 4.2, hemoglobin 13.4, hematocrit hospitalist, MCV 96.4, RDW 16.7, platelet count 113 CT scan of the head without contrast shows no acute intracranial findings.Chronic microvascular ischemic change and cerebral volume  loss. Chest x ray reviewed by me shows central catheter as described without pneumothorax. Small area of suspected pneumonia lateral right base. Lungs elsewhere clear. There is cardiomegaly with postoperative coronary artery bypass grafting change. Aortic Atherosclerosis  12 Lead EKG showed sinus rhythm with LVH and RBBB  ED Course: Patient is a 69 year old African-American female who was recently discharged from the hospital and presents to the ER via EMS for evaluation of change in mental status.  She will be admitted to the hospital for further evaluation.    Review of Systems: As per HPI otherwise all systems reviewed and negative.    Past Medical History:  Diagnosis Date  . (HFpEF) heart failure with preserved ejection fraction ([place])    a. 01/2018 Echo: EF 50-55%, no rwma, Gr1 DD, triv AI, mild MR. Midly dil LA/RV, mod reduced RV fxn. Irreg thickening of TV w/ mobile echodensity that appears to arise from valve.  . Anemia   . Anorexia   . Bacteremia due to Pseudomonas   . Carotid arterial disease ([place])    a. 01/2017 Carotid U/S: RICA <01, LICA <75.  Marland Kitchen CHF (congestive heart failure) ([place])   . Complication of anesthesia    a. Pt reports h/o complication on 9 different occasions - ? hypotension/arrest.  . 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 01/2015 Cath [facility]): 70% LM, p-mLAD 50-60% (Resting FFR 0.75), mRCA 80-90%, ~40 Ost OM & D1-->CABG; b. 01/2016 Staged PCI of LCX x 2 and RCA.  Marland Kitchen ESRD (end stage renal disease) on dialysis ([place])    a. ESRD secondary  to acute kidney failure s/p CABG-->PD  . Essential hypertension   . GERD (gastroesophageal reflux disease)   . Heart murmur   . Hypercholesterolemia   . Myocardial infarction ([place]) 2016  . PVD (peripheral vascular disease) ([place])    a. 01/30/2018 PV Angio: Sev Left Popliteal dzs s/p PTA w/ 74m lutonix DEB w/ mech aspiration of the L popliteal, L AT, and Left tibioperoneal trunck and peroneal artery.   . 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  [facility]: Dr. BMarland KitchenHaithcock: CABG x 3, LIMA to LAD, SVG to RCA, SVG to OM3, EVH  . Sinusitis 2019  . Type II diabetes mellitus with complication (HCC)    CAD    Past Surgical History:  Procedure Laterality Date  . ABDOMINAL HYSTERECTOMY    . AMPUTATION Right 02/25/2018   Procedure: AMPUTATION BELOW KNEE;  Surgeon: SKatha Cabal MD;  Location: [facility] ORS;  Service: Vascular;  Laterality: Right;  . AMPUTATION Left 03/11/2018   Procedure: AMPUTATION BELOW KNEE;  Surgeon: DAlgernon Huxley MD;  Location: [facility] ORS;  Service: Vascular;  Laterality: Left;  . AMPUTATION Bilateral 04/13/2018   Procedure: REVISION OF BILATERAL AMPUTATIONS BELOW KNEE WITH WOUND VAC APPLICATIONS;  Surgeon: EEvaristo Bury MD;  Location: [facility] OR[facility];  Service: Vascular;  Laterality: Bilateral;  . AMPUTATION TOE Left 02/06/2018   Procedure: AMPUTATION GREAT TOE;  Surgeon: FSamara Deist DPM;  Location: [facility] ORS;  Service: Podiatry;  Laterality: Left;  . APPLICATION OF WOUND VAC Left 04/13/2018   Procedure: APPLICATION OF WOUND VAC;  Surgeon: EEvaristo Bury MD;  Location: [facility] OR[facility];  Service: Vascular;  Laterality: Left;  . ARTERIOVENOUS GRAFT PLACEMENT  05/2016  . AV FISTULA PLACEMENT Left 05/30/2016   Procedure: ARTERIOVENOUS graft;  Surgeon: JAlgernon Huxley MD;  Location: [facility];  Service: Vascular;  Laterality: Left;  . CAPD INSERTION N/A 10/30/2017   Procedure: LAPAROSCOPIC INSERTION CONTINUOUS AMBULATORY PERITONEAL DIALYSIS  (CAPD) CATHETER;  Surgeon: DAlgernon Huxley MD;  Location: [facility];  Service: Vascular;  Laterality: N/A;  . CARDIAC CATHETERIZATION  01/2015   [facility]: Ost LM 70%, p-m LAD 50-60% (Rest FFR + @ 0.75), mRCA 80-90%, ostD1 40%, pOM1 40%  . CATARACT EXTRACTION W/PHACO Left 01/18/2016   Procedure: CATARACT EXTRACTION PHACO AND INTRAOCULAR LENS PLACEMENT (IOC);  Surgeon: BEulogio Bear MD;  Location: [facility];  Service: Ophthalmology;  Laterality: Left;  [place]1.05 AP%  15.5 CDE 10.16 Fluid Pack Lot # [id number]  . CATARACT EXTRACTION W/PHACO Right 08/01/2016   Procedure: CATARACT EXTRACTION PHACO AND INTRAOCULAR LENS PLACEMENT (IOC);  Surgeon: BEulogio Bear MD;  Location: [facility];  Service: Ophthalmology;  Laterality: Right;  [place]01:00.6 AP% 11.4 CDE 6.93  LOT # [id number]  . COLONOSCOPY    . CORONARY ANGIOPLASTY     SENTS 02/12/16  . CORONARY ARTERY BYPASS GRAFT  03/28/15    [facility]: Dr. HWaldemar Dickens LIMA to LAD, SVG to RCA, SVG to OM3, EVH  . DIALYSIS/PERMA CATHETER INSERTION N/A 02/11/2018   Procedure: DIALYSIS/PERMA CATHETER INSERTION;  Surgeon: DAlgernon Huxley MD;  [facility];  Service: Cardiovascular;  Laterality: N/A;  . DIALYSIS/PERMA CATHETER REMOVAL Right 02/04/2018   Procedure: DIALYSIS/PERMA CATHETER REMOVAL;  Surgeon: SKatha Cabal MD;  [facility];  Service: Cardiovascular;  Laterality: Right;  . ESOPHAGOGASTRODUODENOSCOPY (EGD) WITH PROPOFOL N/A 10/24/2016   Procedure: ESOPHAGOGASTRODUODENOSCOPY (EGD) WITH PROPOFOL;  Surgeon: WLucilla Lame MD;  Location: [facility];  Service: Endoscopy;  Laterality: N/A;  .  EYE SURGERY Bilateral    cataract surgery  . EYE SURGERY     drains for glaucoma  . INSERTION EXPRESS TUBE SHUNT Right 08/01/2016   Procedure: INSERTION EXPRESS TUBE SHUNT;  Surgeon: Eulogio Bear, MD;  Location: [facility];  Service: Ophthalmology;  Laterality: Right;  . INSERTION OF AHMED VALVE Left 08/15/2016   Procedure: INSERTION OF AHMED VALVE;  Surgeon: Eulogio Bear, MD;  Location: [facility];  Service: Ophthalmology;  Laterality: Left;  . INSERTION OF DIALYSIS CATHETER    . LOWER EXTREMITY ANGIOGRAPHY Right 02/19/2018   Procedure: Lower Extremity Angiography;  Surgeon: Algernon Huxley, MD;  Location: [place] [facility];  Service: Cardiovascular;  Laterality: Right;  . LOWER EXTREMITY ANGIOGRAPHY Left 01/30/2018   Procedure: Lower Extremity Angiography;  Surgeon: Katha Cabal,  MD;  Location: [place] [facility];  Service: Cardiovascular;  Laterality: Left;  . REMOVAL OF A DIALYSIS CATHETER N/A 02/25/2018   Procedure: REMOVAL OF A DIALYSIS CATHETER;  Surgeon: Katha Cabal, MD;  Location: [facility];  Service: Vascular;  Laterality: N/A;  . TEMPORARY DIALYSIS CATHETER N/A 02/06/2018   Procedure: TEMPORARY DIALYSIS CATHETER;  Surgeon: Katha Cabal, MD;  Location: [place] [facility];  Service: Cardiovascular;  Laterality: N/A;  . TRANSTHORACIC ECHOCARDIOGRAM  January 2017    EF 60-65%. GR 2 DD. Mild degenerative mitral valve disease but no prolapse or regurgitation. Mild left atrial dilation. Mild to moderate LVH. Pericardial effusion gone  . UPPER EXTREMITY ANGIOGRAPHY Left 06/27/2016   Procedure: Upper Extremity Angiography;  Surgeon: Algernon Huxley, MD;  Location: [place] [facility];  Service: Cardiovascular;  Laterality: Left;  . UPPER EXTREMITY ANGIOGRAPHY Left 08/22/2016   Procedure: Upper Extremity Angiography;  Surgeon: Algernon Huxley, MD;  Location: [place] [facility];  Service: Cardiovascular;  Laterality: Left;  Marland Kitchen VASCULAR SURGERY    . WOUND DEBRIDEMENT Left 05/08/2018   Procedure: DEBRIDEMENT OF LEFT LEG BKA;  Surgeon: Algernon Huxley, MD;  Location: [facility];  Service: General;  Laterality: Left;     reports that she has never smoked. She has never used smokeless tobacco. She reports that she does not drink alcohol and does not use drugs.  Allergies  Allergen Reactions  . Chlorthalidone Anaphylaxis, Itching and Rash  . Dilaudid [Hydromorphone Hcl] Anaphylaxis    Hypersensitivity. 0.44m caused pt to become unresponsive  . Fentanyl Rash  . Midazolam Rash  . Morphine And Related Anaphylaxis    Pt unresponsive, apneic after 0.582mdilaudid   . Ace Inhibitors Other (See Comments)    Reaction:  Hyperkalemia, agitation   . Angiotensin Receptor Blockers Other (See Comments)    Reaction:  Hyperkalemia, agitation   . Norvasc [Amlodipine] Itching and Rash  .  Phenergan [Promethazine Hcl] Anxiety    "antsy, can't sit still"    Family History  Problem Relation Age of Onset  . Diabetes Mellitus II Mother   . Heart failure Mother   . Pancreatic cancer Father      Prior to Admission medications   Medication Sig Start Date End Date Taking? Authorizing Provider  apixaban (ELIQUIS) 2.5 MG TABS tablet Take 1 tablet (2.5 mg total) by mouth 2 (two) times daily. 02/15/18   VaVaughan BastaMD  aspirin EC 81 MG tablet Take 1 tablet (81 mg total) by mouth daily. 10/19/16   PaDustin FlockMD  atorvastatin (LIPITOR) 80 MG tablet Take 80 mg by mouth at bedtime.  05/05/17   [provider]  brimonidine (  ALPHAGAN) 0.2 % ophthalmic solution Place 1 drop into both eyes 3 (three) times daily. 11/05/19   Ezekiel Slocumb, DO  carvedilol (COREG) 12.5 MG tablet Take 12.5 mg by mouth 2 (two) times daily. Take in combination with 25 mg for total of 37.5 mg twice daily 02/24/20   [provider]  carvedilol (COREG) 25 MG tablet Take 1 tablet (25 mg total) by mouth 2 (two) times daily. 07/13/18   Stark Jock Jude, MD  cloNIDine (CATAPRES) 0.1 MG tablet Take 0.1 mg by mouth daily as needed (BP >160/80).    [provider]  fluticasone (FLONASE) 50 MCG/ACT nasal spray Place 2 sprays into both nostrils daily.    [provider]  hydrALAZINE (APRESOLINE) 50 MG tablet Take 1 tablet (50 mg total) by mouth every 8 (eight) hours. 11/05/19   Nicole Kindred A, DO  insulin aspart (NOVOLOG) 100 UNIT/ML injection Inject 0-6 Units into the skin See admin instructions. Inject under the skin according to blood glucose reading:  <201: 0u 201- 250: 2u 251-300: 4u 301-350: 5u 351-400: 6u    [provider]  insulin glargine (LANTUS) 100 UNIT/ML injection Inject 5 Units into the skin daily in the afternoon.     [provider]  ipratropium (ATROVENT) 0.06 % nasal spray Place 2 sprays into both nostrils 2 (two) times daily. 09/05/19    Loletha Grayer, MD  isosorbide mononitrate (IMDUR) 30 MG 24 hr tablet Take 30 mg by mouth daily.     [provider]  levETIRAcetam (KEPPRA) 250 MG tablet Three times weekly after dialysis Patient taking differently: Take 250 mg by mouth Every Tuesday,Thursday,and Saturday with dialysis.  05/12/18   Loletha Grayer, MD  levETIRAcetam (KEPPRA) 500 MG tablet Take 500 mg by mouth 2 (two) times daily. 02/24/20   [provider]  meclizine (ANTIVERT) 25 MG tablet Take 25 mg by mouth every 4 (four) hours as needed for dizziness.    [provider]  mirtazapine (REMERON) 15 MG tablet Take 7.5 mg by mouth daily.     [provider]  Omega-3 1000 MG CAPS Take 2 capsules by mouth daily.     [provider]  pantoprazole (PROTONIX) 40 MG tablet Take 1 tablet (40 mg total) by mouth daily. 06/04/19   [name], MD  Polyvinyl Alcohol-Povidone PF (REFRESH) 1.4-0.6 % SOLN Place 1 drop into both eyes 2 (two) times a day.     [provider]  rOPINIRole (REQUIP) 0.25 MG tablet Take 0.25 mg by mouth at bedtime.    [provider]  sevelamer carbonate (RENVELA) 800 MG tablet 2 Tablet(s) By Mouth 3 Times Daily    [provider]  timolol (TIMOPTIC) 0.5 % ophthalmic solution Place 1 drop into both eyes 3 (three) times daily. 03/18/18   Bettey Costa, MD  traMADol (ULTRAM) 50 MG tablet Take 1 tablet (50 mg total) by mouth every 12 (twelve) hours as needed for up to 5 days for moderate pain or severe pain. 04/06/20 04/11/20  Sidney Ace, MD  triamcinolone cream (KENALOG) 0.1 % Apply topically. 11/01/19   [provider]  valACYclovir (VALTREX) 500 MG tablet Take 1 tablet (500 mg total) by mouth daily for 6 days. 04/06/20 04/12/20  Sidney Ace, MD  vitamin B-12 (CYANOCOBALAMIN) 1000 MCG tablet Take 1 tablet (1,000 mcg total) by mouth daily. 02/15/18   Vaughan Basta, MD    Physical Exam: Vitals:   04/10/20 1106  04/10/20 1130 04/10/20 1230 04/10/20 1351  BP: (!) 198/85 (!) 195/95 (!) 182/76 (!) 180/90  Pulse: 68 67 70 77  Resp: 18 18 17 18   Temp: 98.2 F (36.8 C)   98.4 F (36.9 C)  TempSrc: Oral   Oral  SpO2: 97% 96% 95% 98%  Weight: 50 kg     Height: 4' 4"  (1.321 m)        Vitals:   04/10/20 1106 04/10/20 1130 04/10/20 1230 04/10/20 1351  BP: (!) 198/85 (!) 195/95 (!) 182/76 (!) 180/90  Pulse: 68 67 70 77  Resp: 18 18 17 18   Temp: 98.2 F (36.8 C)   98.4 F (36.9 C)  TempSrc: Oral   Oral  SpO2: 97% 96% 95% 98%  Weight: 50 kg     Height: 4' 4"  (1.321 m)       Constitutional: NAD, alert and oriented to person and place. Not to time Eyes: PERRL, lids and conjunctivae normal ENMT: Mucous membranes are moist.  Neck: normal, supple, no masses, no thyromegaly Respiratory: clear to auscultation bilaterally, no wheezing, no crackles. Normal respiratory effort. No accessory muscle use.  Cardiovascular: Regular rate and rhythm, no murmurs / rubs / gallops. No extremity edema. 2+ pedal pulses. No carotid bruits.  Abdomen: no tenderness, no masses palpated. No hepatosplenomegaly. Bowel sounds positive.  Musculoskeletal: no clubbing / cyanosis. Bilateral BKA Skin: no rashes, lesions, ulcers.  Neurologic: No gross focal neurologic deficit. Generalized weakness Psychiatric: Normal mood and affect.   Labs on Admission: I have personally reviewed following labs and imaging studies  CBC: Recent Labs  Lab 04/04/20 0459 04/05/20 0414 04/10/20 1127  WBC 5.5 5.6 4.2  NEUTROABS  --   --  3.0  HGB 12.0 12.2 13.4  HCT 39.8 39.7 43.2  MCV 100.0 99.0 96.4  PLT 98* 81* 381*   Basic Metabolic Panel: Recent Labs  Lab 04/05/20 1003 04/10/20 1127 04/10/20 1129  NA 135  135 140  --   K 4.7  5.0 4.8  --   CL 98  96* 97*  --   CO2 25  27 28   --   GLUCOSE 178*  181* 186*  --   BUN 41*  42* 53*  --   CREATININE 7.86*  7.89* 9.33*  --   CALCIUM 8.9  8.9 8.9  --   MG  --   --  2.2   PHOS 4.6  --   --    GFR: Estimated Creatinine Clearance: 3.3 mL/min (A) (by C-G formula based on SCr of 9.33 mg/dL (H)). Liver Function Tests: Recent Labs  Lab 04/05/20 1003 04/10/20 1127  AST 16 17  ALT 17 13  ALKPHOS 91 92  BILITOT 0.6 0.6  PROT 6.8 7.4  ALBUMIN 3.4*  3.4* 3.7   No results for input(s): LIPASE, AMYLASE in the last 168 hours. Recent Labs  Lab 04/10/20 1129  AMMONIA 19   Coagulation Profile: No results for input(s): INR, PROTIME in the last 168 hours. Cardiac Enzymes: No results for input(s): CKTOTAL, CKMB, CKMBINDEX, TROPONINI in the last 168 hours. BNP (last 3 results) No results for input(s): PROBNP in the last 8760 hours. HbA1C: No results for input(s): HGBA1C in the last 72 hours. CBG: Recent Labs  Lab 04/05/20 1556 04/05/20 2000 04/06/20 0845 04/06/20 1200 04/10/20 1106  GLUCAP 124* 313* 178* 164* 171*   Lipid Profile: No results for input(s): CHOL, HDL, LDLCALC, TRIG, CHOLHDL, LDLDIRECT in the last 72 hours. Thyroid Function Tests: No results for input(s): TSH, T4TOTAL, FREET4, T3FREE, THYROIDAB  in the last 72 hours. Anemia Panel: No results for input(s): VITAMINB12, FOLATE, FERRITIN, TIBC, IRON, RETICCTPCT in the last 72 hours. Urine analysis:    Component Value Date/Time   COLORURINE RED (A) 10/17/2016 1643   APPEARANCEUR TURBID (A) 10/17/2016 1643   LABSPEC 1.014 10/17/2016 1643   PHURINE  10/17/2016 1643    TEST NOT REPORTED DUE TO COLOR INTERFERENCE OF URINE PIGMENT   GLUCOSEU (A) 10/17/2016 1643    TEST NOT REPORTED DUE TO COLOR INTERFERENCE OF URINE PIGMENT   HGBUR (A) 10/17/2016 1643    TEST NOT REPORTED DUE TO COLOR INTERFERENCE OF URINE PIGMENT   BILIRUBINUR (A) 10/17/2016 1643    TEST NOT REPORTED DUE TO COLOR INTERFERENCE OF URINE PIGMENT   KETONESUR (A) 10/17/2016 1643    TEST NOT REPORTED DUE TO COLOR INTERFERENCE OF URINE PIGMENT   PROTEINUR (A) 10/17/2016 1643    TEST NOT REPORTED DUE TO COLOR INTERFERENCE OF  URINE PIGMENT   NITRITE (A) 10/17/2016 1643    TEST NOT REPORTED DUE TO COLOR INTERFERENCE OF URINE PIGMENT   LEUKOCYTESUR (A) 10/17/2016 1643    TEST NOT REPORTED DUE TO COLOR INTERFERENCE OF URINE PIGMENT    Radiological Exams on Admission: DG Chest 2 View  Result Date: 04/10/2020 CLINICAL DATA:  Altered mental status EXAM: CHEST - 2 VIEW COMPARISON:  April 02, 2020 FINDINGS: Central catheter tip is in the right atrium, slightly distal to the cavoatrial junction. No pneumothorax. There is a small area of ill-defined opacity in the lateral right base. Lungs elsewhere are clear. There is cardiomegaly with pulmonary vascular normal. Patient is status post coronary artery bypass grafting. There is aortic atherosclerosis. No bone lesions. IMPRESSION: Central catheter as described without pneumothorax. Small area of suspected pneumonia lateral right base. Lungs elsewhere clear. There is cardiomegaly with postoperative coronary artery bypass grafting change. Aortic Atherosclerosis (ICD10-I70.0). Electronically Signed   By: Lowella Grip III M.D.   On: 04/10/2020 12:19   CT Head Wo Contrast  Result Date: 04/10/2020 CLINICAL DATA:  Altered mental status EXAM: CT HEAD WITHOUT CONTRAST TECHNIQUE: Contiguous axial images were obtained from the base of the skull through the vertex without intravenous contrast. COMPARISON:  05/01/2019 FINDINGS: Brain: No evidence of acute infarction, hemorrhage, hydrocephalus, extra-axial collection or mass lesion/mass effect. Scattered low-density changes within the periventricular and subcortical white matter compatible with chronic microvascular ischemic change. Mild diffuse cerebral volume loss. Vascular: Atherosclerotic calcifications involving the large vessels of the skull base. No unexpected hyperdense vessel. Skull: Normal. Negative for fracture or focal lesion. Sinuses/Orbits: Chronic mucosal thickening within the left sphenoid sinus and posterior left ethmoid  air cells. Remaining paranasal sinuses and mastoid air cells are clear. Other: None. IMPRESSION: 1. No acute intracranial findings. 2. Chronic microvascular ischemic change and cerebral volume loss. Electronically Signed   By: Davina Poke D.O.   On: 04/10/2020 12:29    EKG: Independently reviewed.  Normal sinus rhythm Right bundle branch block and LVH  Assessment/Plan Principal Problem:   AMS (altered mental status) Active Problems:   ESRD (end stage renal disease) on dialysis (HCC)   Type II diabetes mellitus with complication (HCC)   Hypertension   Seizure disorder (HCC)   Cirrhosis, cardiac     Altered mental status Unclear etiology Rule out an acute stroke versus an infectious etiology Send a UA as well as ammonia level to rule out hepatic encephalopathy Obtain MRI of the brain without contrast to rule out an acute stroke    Physical  deconditioning Most likely secondary to recent hospital stay We will request PT evaluation Place patient on fall precautions     Hypertensive urgency Resume oral antihypertensive medications which include carvedilol, hydralazine and Imdur    Diabetes mellitus with complications of end-stage renal disease on hemodialysis Maintain carb consistent diet Glycemic control with sliding scale insulin Nephrology consult for renal replacement therapy    Chronic combined systolic and diastolic dysfunction CHF Patient's last known LVEF is about 35 to 40% from an echocardiogram done in 2019 Stable and not acutely exacerbated    Seizure disorder Continue Keppra Place patient on seizure precautions    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  All questions and concerns have been addressed. She verbalizes understanding and agree with the plan. Disposition Plan: Back to previous home environment Consults called: Nephrology    Collier Bullock MD [facility]     04/10/2020, 2:27 PM

## 2020-04-11 DIAGNOSIS — R4182 Altered mental status, unspecified: Secondary | ICD-10-CM

## 2020-04-11 DIAGNOSIS — R410 Disorientation, unspecified: Secondary | ICD-10-CM | POA: Diagnosis not present

## 2020-04-11 LAB — BASIC METABOLIC PANEL
Anion gap: 16 — ABNORMAL HIGH (ref 5–15)
BUN: 57 mg/dL — ABNORMAL HIGH (ref 8–23)
CO2: 24 mmol/L (ref 22–32)
Calcium: 8.7 mg/dL — ABNORMAL LOW (ref 8.9–10.3)
Chloride: 99 mmol/L (ref 98–111)
Creatinine, Ser: 10.31 mg/dL — ABNORMAL HIGH (ref 0.44–1.00)
GFR, Estimated: 4 mL/min — ABNORMAL LOW (ref >60)
Glucose, Bld: 121 mg/dL — ABNORMAL HIGH (ref 70–99)
Potassium: 4.6 mmol/L (ref 3.5–5.1)
Sodium: 139 mmol/L (ref 135–145)

## 2020-04-11 LAB — HIV ANTIBODY (ROUTINE TESTING W REFLEX): HIV Screen 4th Generation wRfx: NONREACTIVE

## 2020-04-11 LAB — CBC
HCT: 39.6 % (ref 36.0–46.0)
Hemoglobin: 12.1 g/dL (ref 12.0–15.0)
MCH: 30 pg (ref 26.0–34.0)
MCHC: 30.6 g/dL (ref 30.0–36.0)
MCV: 98.3 fL (ref 80.0–100.0)
Platelets: 93 10*3/uL — ABNORMAL LOW (ref 150–400)
RBC: 4.03 MIL/uL (ref 3.87–5.11)
RDW: 16.9 % — ABNORMAL HIGH (ref 11.5–15.5)
WBC: 3.9 10*3/uL — ABNORMAL LOW (ref 4.0–10.5)
nRBC: 0 % (ref 0.0–0.2)

## 2020-04-11 LAB — GLUCOSE, CAPILLARY
Glucose-Capillary: 120 mg/dL — ABNORMAL HIGH (ref 70–99)
Glucose-Capillary: 90 mg/dL (ref 70–99)
Glucose-Capillary: 97 mg/dL (ref 70–99)

## 2020-04-11 LAB — PHOSPHORUS: Phosphorus: 4.2 mg/dL (ref 2.5–4.6)

## 2020-04-11 LAB — TSH: TSH: 11.411 u[IU]/mL — ABNORMAL HIGH (ref 0.350–4.500)

## 2020-04-11 MED ORDER — THIAMINE HCL 100 MG/ML IJ SOLN
100.0000 mg | Freq: Every day | INTRAMUSCULAR | Status: DC
  Administered 2020-04-20: 08:00:00 100 mg via INTRAVENOUS
  Filled 2020-04-11: qty 2

## 2020-04-11 MED ORDER — DOXYCYCLINE HYCLATE 100 MG PO TABS
100.0000 mg | ORAL_TABLET | Freq: Two times a day (BID) | ORAL | Status: AC
  Administered 2020-04-11 – 2020-04-17 (×12): 100 mg via ORAL
  Filled 2020-04-11 (×13): qty 1

## 2020-04-11 MED ORDER — HYDRALAZINE HCL 20 MG/ML IJ SOLN
5.0000 mg | Freq: Four times a day (QID) | INTRAMUSCULAR | Status: DC | PRN
  Administered 2020-04-13: 5 mg via INTRAVENOUS
  Filled 2020-04-11: qty 1

## 2020-04-11 MED ORDER — THIAMINE HCL 100 MG/ML IJ SOLN
250.0000 mg | Freq: Every day | INTRAVENOUS | Status: AC
  Administered 2020-04-15 – 2020-04-19 (×5): 250 mg via INTRAVENOUS
  Filled 2020-04-11 (×6): qty 2.5

## 2020-04-11 MED ORDER — CHLORHEXIDINE GLUCONATE CLOTH 2 % EX PADS
6.0000 | MEDICATED_PAD | Freq: Every day | CUTANEOUS | Status: DC
  Administered 2020-04-11 – 2020-04-19 (×7): 6 via TOPICAL

## 2020-04-11 MED ORDER — SODIUM CHLORIDE 0.9 % IV SOLN
500.0000 mg | INTRAVENOUS | Status: DC
  Filled 2020-04-11 (×2): qty 500

## 2020-04-11 MED ORDER — SODIUM CHLORIDE 0.9 % IV SOLN
2.0000 g | INTRAVENOUS | Status: AC
  Administered 2020-04-11 – 2020-04-17 (×7): 2 g via INTRAVENOUS
  Filled 2020-04-11 (×2): qty 20
  Filled 2020-04-11: qty 2
  Filled 2020-04-11 (×4): qty 20
  Filled 2020-04-11: qty 2
  Filled 2020-04-11: qty 20

## 2020-04-11 MED ORDER — LABETALOL HCL 5 MG/ML IV SOLN: 10.0000 mg | INTRAVENOUS | Status: DC | PRN

## 2020-04-11 MED ORDER — THIAMINE HCL 100 MG/ML IJ SOLN
500.0000 mg | Freq: Three times a day (TID) | INTRAVENOUS | Status: AC
  Administered 2020-04-12 – 2020-04-14 (×9): 500 mg via INTRAVENOUS
  Filled 2020-04-11 (×9): qty 5

## 2020-04-11 NOTE — Progress Notes (Signed)
PROGRESS NOTE    Stephanie Ellis  FXJ:[id number] DOB: Jan 12, 1951 DOA: 04/10/2020 PCP: Pcp, No    Chief Complaint  Patient presents with  . Altered Mental Status    Per EMS; AMS per family. LKN unknown. HD is due today but EMS unsure if missed any. EMS BS 204    Brief Narrative:  Stephanie Ellis is Stephanie Ellis 69 y.o. female with medical history significant for diabetes mellitus with complications of ESRD on HD (M/W/F) , hypertension, PAD s/p bilateral BKA, coronary artery disease status post CABG who was brought to the emergency room by EMS for evaluation of mental status changes.  Patient was recently discharged from the hospital on 04/06/20.  During that hospitalization she had [name] brief PEA arrest after she received hydromorphone and recovered immediately following administration of Narcan.  Her daughter states that since her discharge she has not been her usual self and has been acting very erratic.  At baseline patient is able to take care of herself and activities of daily living such as transfers, grooming and feeding herself.  Since her discharge she has been very weak and has been unable to feed herself or transfer to her wheelchair.  Family is also concerned that her speech is slurred.  Patient refused to go to dialysis on the morning of her admission and so EMS was called.  She's been admitted with acute encephalopathy without clear cause.  Assessment & Plan:   Principal Problem:   AMS (altered mental status) Active Problems:   ESRD (end stage renal disease) on dialysis ([place])   Type II diabetes mellitus with complication (HCC)   Hypertension   Seizure disorder (HCC)   Cirrhosis, cardiac  Acute Metabolic Encephalopathy Unclear etiology, at this time - ddx includes anoxic brain injury related to PEA arrest at last hospitalization, encephalitis in setting of shingles, hypertensive encephalopathy? MRI brian without acute findings (chronic microvascular ischemic changes and few small chronic  infarcts) Follow EEG Neurology c/s, appreciate assistance with workup, discussed with Dr. Doy Mince via secure chat on 12/21 Pending TSH, b12, b1, RPR, HIV Ammonia wnl Follow blood cx SBP's elevated -> hydralazine prn for SBP > 180, continue home meds if able to take PO CT chest with mild loculated effusion along the lateral aspect of the R major fissure -> will treat with abx for possible pneumonia   Mild Loculated Effusion abx for possible pneumonia Blood cx pending Consider discussion with pulm given loculation  Physical deconditioning Most likely secondary to recent hospital stay We will request PT evaluation Place patient on fall precautions   Hypertensive urgency Resume oral antihypertensive medications which include carvedilol, hydralazine and Imdur IV hydralazine prn for SBP > 641  [id number] with complications of end-stage renal disease on hemodialysis Maintain carb consistent diet Glycemic control with sliding scale insulin Nephrology consult for renal replacement therapy  Chronic combined systolic and diastolic dysfunction CHF Patient's last known LVEF is about 35 to 40% from an echocardiogram done in 2019 Stable and not acutely exacerbated Volume per renal  Seizure disorder Continue Keppra Place patient on seizure precautions Follow EEG as above  Thrombocytopenia Chronic, continue to monitor   Peripheral arterial disease Status post bilateral BKA Continue statin, apixaban and carvedilol  Depression Continue Remeron  DVT prophylaxis: eliquis Code Status: full  Family Communication: daughter Disposition:   Status is: Inpatient  Remains inpatient appropriate because:Inpatient level of care appropriate due to severity of illness   Dispo: The patient is from: Home  Anticipated d/c is to: pending              Anticipated d/c date is: > 3 days              Patient currently is not medically stable to d/c.   Consultants:   Neurology,  renal  Procedures:  EEG  Antimicrobials:  Anti-infectives (From admission, onward)   Start     Dose/Rate Route Frequency Ordered Stop   04/11/20 1800  valACYclovir (VALTREX) tablet 500 mg        500 mg Oral Daily 04/10/20 2043     04/11/20 1330  doxycycline (VIBRA-TABS) tablet 100 mg        100 mg Oral Every 12 hours 04/11/20 1244     04/11/20 1300  cefTRIAXone (ROCEPHIN) 2 g in sodium chloride 0.9 % 100 mL IVPB        2 g 200 mL/hr over 30 Minutes Intravenous Every 24 hours 04/11/20 1239 04/16/20 1259   04/11/20 1300  azithromycin (ZITHROMAX) 500 mg in sodium chloride 0.9 % 250 mL IVPB  Status:  Discontinued        500 mg 250 mL/hr over 60 Minutes Intravenous Every 24 hours 04/11/20 1239 04/11/20 1244   04/10/20 1500  valACYclovir (VALTREX) tablet 500 mg  Status:  Discontinued        500 mg Oral Daily 04/10/20 1446 04/10/20 2042     Subjective: Confused, difficult to understand   Objective: Vitals:   04/11/20 1330 04/11/20 1345 04/11/20 1400 04/11/20 1650  BP: (!) 188/77 (!) 196/145 (!) 190/82 (!) 197/82  Pulse: 66 61  65  Resp: 17 18 19    Temp:    97.6 F (36.4 C)  TempSrc:    Axillary  SpO2:    99%  Weight:      Height:        Intake/Output Summary (Last 24 hours) at 04/11/2020 1814 Last data filed at 04/11/2020 1700 Gross per 24 hour  Intake 230 ml  Output 500 ml  Net -270 ml   Filed Weights   04/10/20 1106  Weight: 50 kg    Examination:  General exam: Appears calm and comfortable  Respiratory system: Clear to auscultation. Respiratory effort normal. Cardiovascular system: S1 & S2 heard, RRR.  Gastrointestinal system: Abdomen is nondistended, soft and nontender Central nervous system: disoriented, follows some commands inconsistently, moving all extremities.  Speech is slurred and nonsensical, difficult to understand.  Extremities: bilateral LE amputations Skin: back not examined (pt in dialysis)   Data Reviewed: I have personally reviewed following  labs and imaging studies  CBC: Recent Labs  Lab 04/05/20 0414 04/10/20 1127 04/11/20 0403  WBC 5.6 4.2 3.9*  NEUTROABS  --  3.0  --   HGB 12.2 13.4 12.1  HCT 39.7 43.2 39.6  MCV 99.0 96.4 98.3  PLT 81* 113* 93*    Basic Metabolic Panel: Recent Labs  Lab 04/05/20 1003 04/10/20 1127 04/10/20 1129 04/11/20 0403  NA 135  135 140  --  139  K 4.7  5.0 4.8  --  4.6  CL 98  96* 97*  --  99  CO2 25  27 28   --  24  GLUCOSE 178*  181* 186*  --  121*  BUN 41*  42* 53*  --  57*  CREATININE 7.86*  7.89* 9.33*  --  10.31*  CALCIUM 8.9  8.9 8.9  --  8.7*  MG  --   --  2.2  --  PHOS 4.6  --   --  4.2    GFR: Estimated Creatinine Clearance: 3 mL/min ([place] Gustafson) (by C-G formula based on SCr of 10.31 mg/dL (H)).  Liver Function Tests: Recent Labs  Lab 04/05/20 1003 04/10/20 1127  AST 16 17  ALT 17 13  ALKPHOS 91 92  BILITOT 0.6 0.6  PROT 6.8 7.4  ALBUMIN 3.4*  3.4* 3.7    CBG: Recent Labs  Lab 04/06/20 1200 04/10/20 1106 04/10/20 1727 04/10/20 2046 04/11/20 0843  GLUCAP 164* 171* 146* 133* 120*     Recent Results (from the past 240 hour(s))  Resp Panel by RT-PCR (Flu Eliazer Hemphill&B, Covid) Nasopharyngeal Swab     Status: None   Collection Time: 04/02/20  2:25 PM   Specimen: Nasopharyngeal Swab; Nasopharyngeal(NP) swabs in vial transport medium  Result Value Ref Range Status   SARS Coronavirus 2 by RT PCR NEGATIVE NEGATIVE Final    Comment: (NOTE) SARS-CoV-2 target nucleic acids are NOT DETECTED.  The SARS-CoV-2 RNA is generally detectable in upper respiratory specimens during the acute phase of infection. The lowest concentration of SARS-CoV-2 viral copies this assay can detect is 138 copies/mL. Sadeen Wiegel negative result does not preclude SARS-Cov-2 infection and should not be used as the sole basis for treatment or other patient management decisions. Raihan Kimmel negative result may occur with  improper specimen collection/handling, submission of specimen other than nasopharyngeal  swab, presence of viral mutation(s) within the areas targeted by this assay, and inadequate number of viral copies(<138 copies/mL). Veleria Barnhardt negative result must be combined with clinical observations, patient history, and epidemiological information. The expected result is Negative.  Fact Sheet for Patients:  EntrepreneurPulse.com.au  Fact Sheet for Healthcare Providers:  IncredibleEmployment.be  This test is no t yet approved or cleared by the [place] FDA and  has been authorized for detection and/or diagnosis of SARS-CoV-2 by FDA under an Emergency Use Authorization (EUA). This EUA will remain  in effect (meaning this test can be used) for the duration of the COVID-19 declaration under Section 564(b)(1) of the Act, 21 U.S.C.section 360bbb-3(b)(1), unless the authorization is terminated  or revoked sooner.       Influenza Zenaya Ulatowski by PCR NEGATIVE NEGATIVE Final   Influenza B by PCR NEGATIVE NEGATIVE Final    Comment: (NOTE) The Xpert Xpress SARS-CoV-2/FLU/RSV plus assay is intended as an aid in the diagnosis of influenza from Nasopharyngeal swab specimens and should not be used as Zakariya Knickerbocker sole basis for treatment. Nasal washings and aspirates are unacceptable for Xpert Xpress SARS-CoV-2/FLU/RSV testing.  Fact Sheet for Patients: EntrepreneurPulse.com.au  Fact Sheet for Healthcare Providers: IncredibleEmployment.be  This test is not yet approved or cleared by the [place] FDA and has been authorized for detection and/or diagnosis of SARS-CoV-2 by FDA under an Emergency Use Authorization (EUA). This EUA will remain in effect (meaning this test can be used) for the duration of the COVID-19 declaration under Section 564(b)(1) of the Act, 21 U.S.C. section 360bbb-3(b)(1), unless the authorization is terminated or revoked.  Performed at [facility], [address] [place], [place] [address]   MRSA PCR  Screening     Status: None   Collection Time: 04/04/20  6:00 AM   Specimen: Nasopharyngeal  Result Value Ref Range Status   MRSA by PCR NEGATIVE NEGATIVE Final    Comment:        The GeneXpert MRSA Assay (FDA approved for NASAL specimens only), is one component of Margaretmary Prisk comprehensive MRSA colonization surveillance program. It is not intended  to diagnose MRSA infection nor to guide or monitor treatment for MRSA infections. Performed at [facility], [address] [place], [place] [address]   Resp Panel by RT-PCR (Flu Merrilyn Legler&B, Covid) Nasopharyngeal Swab     Status: None   Collection Time: 04/10/20 12:58 PM   Specimen: Nasopharyngeal Swab; Nasopharyngeal(NP) swabs in vial transport medium  Result Value Ref Range Status   SARS Coronavirus 2 by RT PCR NEGATIVE NEGATIVE Final    Comment: (NOTE) SARS-CoV-2 target nucleic acids are NOT DETECTED.  The SARS-CoV-2 RNA is generally detectable in upper respiratory specimens during the acute phase of infection. The lowest concentration of SARS-CoV-2 viral copies this assay can detect is 138 copies/mL. Stori Royse negative result does not preclude SARS-Cov-2 infection and should not be used as the sole basis for treatment or other patient management decisions. Wilfrid Hyser negative result may occur with  improper specimen collection/handling, submission of specimen other than nasopharyngeal swab, presence of viral mutation(s) within the areas targeted by this assay, and inadequate number of viral copies(<138 copies/mL). Maddoxx Burkitt negative result must be combined with clinical observations, patient history, and epidemiological information. The expected result is Negative.  Fact Sheet for Patients:  EntrepreneurPulse.com.au  Fact Sheet for Healthcare Providers:  IncredibleEmployment.be  This test is no t yet approved or cleared by the [place] FDA and  has been authorized for detection and/or diagnosis of SARS-CoV-2 by FDA  under an Emergency Use Authorization (EUA). This EUA will remain  in effect (meaning this test can be used) for the duration of the COVID-19 declaration under Section 564(b)(1) of the Act, 21 U.S.C.section 360bbb-3(b)(1), unless the authorization is terminated  or revoked sooner.       Influenza Alawna Graybeal by PCR NEGATIVE NEGATIVE Final   Influenza B by PCR NEGATIVE NEGATIVE Final    Comment: (NOTE) The Xpert Xpress SARS-CoV-2/FLU/RSV plus assay is intended as an aid in the diagnosis of influenza from Nasopharyngeal swab specimens and should not be used as Natassja Ollis sole basis for treatment. Nasal washings and aspirates are unacceptable for Xpert Xpress SARS-CoV-2/FLU/RSV testing.  Fact Sheet for Patients: EntrepreneurPulse.com.au  Fact Sheet for Healthcare Providers: IncredibleEmployment.be  This test is not yet approved or cleared by the [place] FDA and has been authorized for detection and/or diagnosis of SARS-CoV-2 by FDA under an Emergency Use Authorization (EUA). This EUA will remain in effect (meaning this test can be used) for the duration of the COVID-19 declaration under Section 564(b)(1) of the Act, 21 U.S.C. section 360bbb-3(b)(1), unless the authorization is terminated or revoked.  Performed at [facility], [address], [place],  [address]          Radiology Studies: DG Chest 2 View  Result Date: 04/10/2020 CLINICAL DATA:  Altered mental status EXAM: CHEST - 2 VIEW COMPARISON:  April 02, 2020 FINDINGS: Central catheter tip is in the right atrium, slightly distal to the cavoatrial junction. No pneumothorax. There is Maylen Waltermire small area of ill-defined opacity in the lateral right base. Lungs elsewhere are clear. There is cardiomegaly with pulmonary vascular normal. Patient is status post coronary artery bypass grafting. There is aortic atherosclerosis. No bone lesions. IMPRESSION: Central catheter as described without  pneumothorax. Small area of suspected pneumonia lateral right base. Lungs elsewhere clear. There is cardiomegaly with postoperative coronary artery bypass grafting change. Aortic Atherosclerosis (ICD10-I70.0). Electronically Signed   By: Lowella Grip III M.D.   On: 04/10/2020 12:19   CT Head Wo Contrast  Result Date: 04/10/2020 CLINICAL DATA:  Altered mental status EXAM: CT HEAD WITHOUT CONTRAST TECHNIQUE: Contiguous axial images were obtained from the base of the skull through the vertex without intravenous contrast. COMPARISON:  05/01/2019 FINDINGS: Brain: No evidence of acute infarction, hemorrhage, hydrocephalus, extra-axial collection or mass lesion/mass effect. Scattered low-density changes within the periventricular and subcortical white matter compatible with chronic microvascular ischemic change. Mild diffuse cerebral volume loss. Vascular: Atherosclerotic calcifications involving the large vessels of the skull base. No unexpected hyperdense vessel. Skull: Normal. Negative for fracture or focal lesion. Sinuses/Orbits: Chronic mucosal thickening within the left sphenoid sinus and posterior left ethmoid air cells. Remaining paranasal sinuses and mastoid air cells are clear. Other: None. IMPRESSION: 1. No acute intracranial findings. 2. Chronic microvascular ischemic change and cerebral volume loss. Electronically Signed   By: Davina Poke D.O.   On: 04/10/2020 12:29   CT CHEST WO CONTRAST  Result Date: 04/10/2020 CLINICAL DATA:  Altered mental status EXAM: CT CHEST WITHOUT CONTRAST TECHNIQUE: Multidetector CT imaging of the chest was performed following the standard protocol without IV contrast. COMPARISON:  Chest radiograph April 10, 2020; chest CT Sep 02, 2019 FINDINGS: Note that there is Dwane Andres degree of motion artifact. Cardiovascular: There is no thoracic aortic aneurysm. There are scattered foci of calcification in visualized great vessels. There is aortic atherosclerosis as well as  multiple foci of coronary artery calcification. There is cardiomegaly. No pericardial effusion or pericardial thickening. Central catheter tip is in the right atrium, slightly beyond the cavoatrial junction. Mediastinum/Nodes: Thyroid appears unremarkable. No appreciable thoracic adenopathy. No esophageal lesions are evident. Lungs/Pleura: There is atelectatic change in the lung bases. There is suspected loculated effusion along the lateral aspect of the right major fissure. No frank airspace consolidation. No appreciable pleural effusions. No pneumothorax. Trachea and major bronchial structures appear patent, although there is Lewis Keats degree of motion artifact. Upper Abdomen: There is upper abdominal aortic as well as multiple mesenteric arterial vascular calcifications. Visualized upper abdominal structures otherwise appear unremarkable. Musculoskeletal: Status post median sternotomy. No blastic or lytic bone lesions evident. No evident chest wall lesions. IMPRESSION: 1. Study somewhat less than optimal due to Lakasha Mcfall degree of motion artifact. 2. Areas of atelectatic change. Mild loculated effusion along the lateral aspect of the right major fissure. No edema or consolidation. 3.  No evident adenopathy. 4. Aortic atherosclerosis as well as foci of great vessel and coronary artery calcification. There is Francisca Langenderfer degree of cardiomegaly. Central catheter tip in right atrium. Aortic Atherosclerosis (ICD10-I70.0). Electronically Signed   By: Lowella Grip III M.D.   On: 04/10/2020 14:59   MR BRAIN WO CONTRAST  Result Date: 04/10/2020 CLINICAL DATA:  Mental status change EXAM: MRI HEAD WITHOUT CONTRAST TECHNIQUE: Multiplanar, multiecho pulse sequences of the brain and surrounding structures were obtained without intravenous contrast. COMPARISON:  March 2020 FINDINGS: Motion artifact is present. Brain: There is no acute infarction or intracranial hemorrhage. There is no intracranial mass, mass effect, or edema. There is no  hydrocephalus or extra-axial fluid collection. Prominence of the ventricles and sulci reflects generalized parenchymal volume loss. Patchy and confluent areas of T2 hyperintensity in the supratentorial white matter are nonspecific but probably reflect moderate chronic microvascular ischemic changes. There is Aarilyn Dye chronic small vessel infarct of the left thalamus. Small chronic infarcts of the cerebellum. There are Clarrissa Shimkus few scattered small foci of susceptibility hypointensity in the cerebral white matter likely reflecting chronic microhemorrhages. Vascular: Major vessel flow voids at the skull base are preserved. Skull and upper cervical spine: Normal marrow signal  is preserved. Sinuses/Orbits: Patchy paranasal sinus mucosal thickening, including chronic left sphenoid mucosal thickening. Bilateral lens replacements. Other: Sella is unremarkable.  Patchy mastoid fluid opacification. IMPRESSION: No acute infarction, hemorrhage, or mass. Chronic microvascular ischemic changes and few small chronic infarcts. No substantial change since 2020. Electronically Signed   By: Macy Mis M.D.   On: 04/10/2020 19:56        Scheduled Meds: . apixaban  2.5 mg Oral BID  . aspirin EC  81 mg Oral Daily  . atorvastatin  80 mg Oral QHS  . brimonidine  1 drop Both Eyes TID  . carvedilol  37.5 mg Oral BID WC  . Chlorhexidine Gluconate Cloth  6 each Topical Q0600  . doxycycline  100 mg Oral Q12H  . fluticasone  2 spray Each Nare Daily  . hydrALAZINE  50 mg Oral Q8H  . insulin aspart  0-6 Units Subcutaneous TID WC  . ipratropium  2 spray Each Nare BID  . isosorbide mononitrate  30 mg Oral Daily  . [START ON 04/12/2020] levETIRAcetam  250 mg Oral Q M,W,F-HD  . levETIRAcetam  500 mg Oral BID  . mirtazapine  7.5 mg Oral QHS  . omega-3 acid ethyl esters  2 g Oral Daily  . pantoprazole  40 mg Oral Daily  . rOPINIRole  0.25 mg Oral QHS  . sevelamer carbonate  1,600 mg Oral TID WC  . sodium chloride flush  3 mL Intravenous  Q12H  . sodium chloride flush  3 mL Intravenous Q12H  . timolol  1 drop Both Eyes TID  . triamcinolone   Topical BID  . valACYclovir  500 mg Oral Daily  . vitamin B-12  1,000 mcg Oral Daily   Continuous Infusions: . sodium chloride    . cefTRIAXone (ROCEPHIN)  IV       LOS: 1 day    Time spent: over 30 min    Fayrene Helper, MD [facility]   To contact the attending provider between 7A-7P or the covering provider during after hours 7P-7A, please log into the web site [email or web address] and access using universal [place] password for that web site. If you do not have the password, please call the hospital operator.  04/11/2020, 6:14 PM

## 2020-04-11 NOTE — Progress Notes (Signed)
[facility]  ROUNDING NOTE   Subjective:   [name] was admitted to [facility] on 04/10/2020 for Slurred speech [R47.81] Altered mental status, unspecified altered mental status type [R41.82] AMS (altered mental status) [R41.82]  Patient seen during dialysis.  She fell from her bed in the ER yesterday Today also she is confused Requiring sitter for reorientation during dialysis and dressing change   HEMODIALYSIS FLOWSHEET:  Blood Flow Rate (mL/min): 350 mL/min Arterial Pressure (mmHg): -200 mmHg Venous Pressure (mmHg): 110 mmHg Transmembrane Pressure (mmHg): 50 mmHg Ultrafiltration Rate (mL/min): 330 mL/min Dialysate Flow Rate (mL/min): 600 ml/min Conductivity: Machine : 14 Conductivity: Machine : 14 Dialysis Fluid Bolus: Normal Saline Bolus Amount (mL): 250 mL     Objective:  Vital signs in last 24 hours:  Temp:  [97.2 F (36.2 C)-98.8 F (37.1 C)] 98.8 F (37.1 C) (12/21 1038) Pulse Rate:  [59-77] 61 (12/21 1345) Resp:  [13-28] 18 (12/21 1345) BP: (119-196)/(51-145) 196/145 (12/21 1345) SpO2:  [94 %-99 %] 99 % (12/21 0831)  Weight change:  Filed Weights   04/10/20 1106  Weight: 50 kg    Intake/Output: No intake/output data recorded.   Intake/Output this shift:  No intake/output data recorded.  Physical Exam: General:  Resting in bed, in no acute distress  Head:  Oral mucous membranes moist  Eyes:  Sclerae and conjunctivae clear  Lungs:  Respirations symmetrical and unlabored, lungs clear  Heart:  S1-S2, no rubs or gallops  Abdomen:  Soft, nontender, nondistended  Extremities:  bilateral BKA, no edema noted in upper legs  Neurologic:  Confused, restless     Access: LIJ permcath    Basic Metabolic Panel: Recent Labs  Lab 04/05/20 1003 04/10/20 1127 04/10/20 1129 04/11/20 0403  NA 135  135 140  --  139  K 4.7  5.0 4.8  --  4.6  CL 98  96* 97*  --  99  CO2 25  27 28   --  24  GLUCOSE 178*  181* 186*  --  121*  BUN 41*   42* 53*  --  57*  CREATININE 7.86*  7.89* 9.33*  --  10.31*  CALCIUM 8.9  8.9 8.9  --  8.7*  MG  --   --  2.2  --   PHOS 4.6  --   --  4.2    Liver Function Tests: Recent Labs  Lab 04/05/20 1003 04/10/20 1127  AST 16 17  ALT 17 13  ALKPHOS 91 92  BILITOT 0.6 0.6  PROT 6.8 7.4  ALBUMIN 3.4*  3.4* 3.7   No results for input(s): LIPASE, AMYLASE in the last 168 hours. Recent Labs  Lab 04/10/20 1129  AMMONIA 19    CBC: Recent Labs  Lab 04/05/20 0414 04/10/20 1127 04/11/20 0403  WBC 5.6 4.2 3.9*  NEUTROABS  --  3.0  --   HGB 12.2 13.4 12.1  HCT 39.7 43.2 39.6  MCV 99.0 96.4 98.3  PLT 81* 113* 93*    Cardiac Enzymes: No results for input(s): CKTOTAL, CKMB, CKMBINDEX, TROPONINI in the last 168 hours.  BNP: Invalid input(s): POCBNP  CBG: Recent Labs  Lab 04/06/20 1200 04/10/20 1106 04/10/20 1727 04/10/20 2046 04/11/20 0843  GLUCAP 164* 171* 146* 133* 120*    Microbiology: Results for orders placed or performed during the hospital encounter of 04/10/20  Resp Panel by RT-PCR (Flu A&B, Covid) Nasopharyngeal Swab     Status: None   Collection Time: 04/10/20 12:58 PM   Specimen:  Nasopharyngeal Swab; Nasopharyngeal(NP) swabs in vial transport medium  Result Value Ref Range Status   SARS Coronavirus 2 by RT PCR NEGATIVE NEGATIVE Final    Comment: (NOTE) SARS-CoV-2 target nucleic acids are NOT DETECTED.  The SARS-CoV-2 RNA is generally detectable in upper respiratory specimens during the acute phase of infection. The lowest concentration of SARS-CoV-2 viral copies this assay can detect is 138 copies/mL. A negative result does not preclude SARS-Cov-2 infection and should not be used as the sole basis for treatment or other patient management decisions. A negative result may occur with  improper specimen collection/handling, submission of specimen other than nasopharyngeal swab, presence of viral mutation(s) within the areas targeted by this assay, and  inadequate number of viral copies(<138 copies/mL). A negative result must be combined with clinical observations, patient history, and epidemiological information. The expected result is Negative.  Fact Sheet for Patients:  EntrepreneurPulse.com.au  Fact Sheet for Healthcare Providers:  IncredibleEmployment.be  This test is no t yet approved or cleared by the [place] FDA and  has been authorized for detection and/or diagnosis of SARS-CoV-2 by FDA under an Emergency Use Authorization (EUA). This EUA will remain  in effect (meaning this test can be used) for the duration of the COVID-19 declaration under Section 564(b)(1) of the Act, 21 U.S.C.section 360bbb-3(b)(1), unless the authorization is terminated  or revoked sooner.       Influenza A by PCR NEGATIVE NEGATIVE Final   Influenza B by PCR NEGATIVE NEGATIVE Final    Comment: (NOTE) The Xpert Xpress SARS-CoV-2/FLU/RSV plus assay is intended as an aid in the diagnosis of influenza from Nasopharyngeal swab specimens and should not be used as a sole basis for treatment. Nasal washings and aspirates are unacceptable for Xpert Xpress SARS-CoV-2/FLU/RSV testing.  Fact Sheet for Patients: EntrepreneurPulse.com.au  Fact Sheet for Healthcare Providers: IncredibleEmployment.be  This test is not yet approved or cleared by the [place] FDA and has been authorized for detection and/or diagnosis of SARS-CoV-2 by FDA under an Emergency Use Authorization (EUA). This EUA will remain in effect (meaning this test can be used) for the duration of the COVID-19 declaration under Section 564(b)(1) of the Act, 21 U.S.C. section 360bbb-3(b)(1), unless the authorization is terminated or revoked.  Performed at [facility], [address] [place], [place] [address]     Coagulation Studies: No results for input(s): LABPROT, INR in the last 72  hours.  Urinalysis: No results for input(s): COLORURINE, LABSPEC, PHURINE, GLUCOSEU, HGBUR, BILIRUBINUR, KETONESUR, PROTEINUR, UROBILINOGEN, NITRITE, LEUKOCYTESUR in the last 72 hours.  Invalid input(s): APPERANCEUR    Imaging: DG Chest 2 View  Result Date: 04/10/2020 CLINICAL DATA:  Altered mental status EXAM: CHEST - 2 VIEW COMPARISON:  April 02, 2020 FINDINGS: Central catheter tip is in the right atrium, slightly distal to the cavoatrial junction. No pneumothorax. There is a small area of ill-defined opacity in the lateral right base. Lungs elsewhere are clear. There is cardiomegaly with pulmonary vascular normal. Patient is status post coronary artery bypass grafting. There is aortic atherosclerosis. No bone lesions. IMPRESSION: Central catheter as described without pneumothorax. Small area of suspected pneumonia lateral right base. Lungs elsewhere clear. There is cardiomegaly with postoperative coronary artery bypass grafting change. Aortic Atherosclerosis (ICD10-I70.0). Electronically Signed   By: Lowella Grip III M.D.   On: 04/10/2020 12:19   CT Head Wo Contrast  Result Date: 04/10/2020 CLINICAL DATA:  Altered mental status EXAM: CT HEAD WITHOUT CONTRAST TECHNIQUE: Contiguous axial images were obtained from the  base of the skull through the vertex without intravenous contrast. COMPARISON:  05/01/2019 FINDINGS: Brain: No evidence of acute infarction, hemorrhage, hydrocephalus, extra-axial collection or mass lesion/mass effect. Scattered low-density changes within the periventricular and subcortical white matter compatible with chronic microvascular ischemic change. Mild diffuse cerebral volume loss. Vascular: Atherosclerotic calcifications involving the large vessels of the skull base. No unexpected hyperdense vessel. Skull: Normal. Negative for fracture or focal lesion. Sinuses/Orbits: Chronic mucosal thickening within the left sphenoid sinus and posterior left ethmoid air cells.  Remaining paranasal sinuses and mastoid air cells are clear. Other: None. IMPRESSION: 1. No acute intracranial findings. 2. Chronic microvascular ischemic change and cerebral volume loss. Electronically Signed   By: Davina Poke D.O.   On: 04/10/2020 12:29   CT CHEST WO CONTRAST  Result Date: 04/10/2020 CLINICAL DATA:  Altered mental status EXAM: CT CHEST WITHOUT CONTRAST TECHNIQUE: Multidetector CT imaging of the chest was performed following the standard protocol without IV contrast. COMPARISON:  Chest radiograph April 10, 2020; chest CT Sep 02, 2019 FINDINGS: Note that there is a degree of motion artifact. Cardiovascular: There is no thoracic aortic aneurysm. There are scattered foci of calcification in visualized great vessels. There is aortic atherosclerosis as well as multiple foci of coronary artery calcification. There is cardiomegaly. No pericardial effusion or pericardial thickening. Central catheter tip is in the right atrium, slightly beyond the cavoatrial junction. Mediastinum/Nodes: Thyroid appears unremarkable. No appreciable thoracic adenopathy. No esophageal lesions are evident. Lungs/Pleura: There is atelectatic change in the lung bases. There is suspected loculated effusion along the lateral aspect of the right major fissure. No frank airspace consolidation. No appreciable pleural effusions. No pneumothorax. Trachea and major bronchial structures appear patent, although there is a degree of motion artifact. Upper Abdomen: There is upper abdominal aortic as well as multiple mesenteric arterial vascular calcifications. Visualized upper abdominal structures otherwise appear unremarkable. Musculoskeletal: Status post median sternotomy. No blastic or lytic bone lesions evident. No evident chest wall lesions. IMPRESSION: 1. Study somewhat less than optimal due to a degree of motion artifact. 2. Areas of atelectatic change. Mild loculated effusion along the lateral aspect of the right major  fissure. No edema or consolidation. 3.  No evident adenopathy. 4. Aortic atherosclerosis as well as foci of great vessel and coronary artery calcification. There is a degree of cardiomegaly. Central catheter tip in right atrium. Aortic Atherosclerosis (ICD10-I70.0). Electronically Signed   By: Lowella Grip III M.D.   On: 04/10/2020 14:59   MR BRAIN WO CONTRAST  Result Date: 04/10/2020 CLINICAL DATA:  Mental status change EXAM: MRI HEAD WITHOUT CONTRAST TECHNIQUE: Multiplanar, multiecho pulse sequences of the brain and surrounding structures were obtained without intravenous contrast. COMPARISON:  March 2020 FINDINGS: Motion artifact is present. Brain: There is no acute infarction or intracranial hemorrhage. There is no intracranial mass, mass effect, or edema. There is no hydrocephalus or extra-axial fluid collection. Prominence of the ventricles and sulci reflects generalized parenchymal volume loss. Patchy and confluent areas of T2 hyperintensity in the supratentorial white matter are nonspecific but probably reflect moderate chronic microvascular ischemic changes. 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 Small chronic infarcts of the cerebellum. There are a few scattered small foci of susceptibility hypointensity in the cerebral white matter likely reflecting chronic microhemorrhages. Vascular: Major vessel flow voids at the skull base are preserved. Skull and upper cervical spine: Normal marrow signal is preserved. Sinuses/Orbits: Patchy paranasal sinus mucosal thickening, including chronic left sphenoid mucosal thickening. Bilateral lens  replacements. Other: Sella is unremarkable.  Patchy mastoid fluid opacification. IMPRESSION: No acute infarction, hemorrhage, or mass. Chronic microvascular ischemic changes and few small chronic infarcts. No substantial change since 2020. Electronically Signed   By: Macy Mis M.D.   On: 04/10/2020 19:56     Medications:    Scheduled  Meds: . apixaban  2.5 mg Oral BID  . aspirin EC  81 mg Oral Daily  . atorvastatin  80 mg Oral QHS  . brimonidine  1 drop Both Eyes TID  . carvedilol  37.5 mg Oral BID WC  . Chlorhexidine Gluconate Cloth  6 each Topical Q0600  . doxycycline  100 mg Oral Q12H  . fluticasone  2 spray Each Nare Daily  . hydrALAZINE  50 mg Oral Q8H  . insulin aspart  0-6 Units Subcutaneous TID WC  . ipratropium  2 spray Each Nare BID  . isosorbide mononitrate  30 mg Oral Daily  . [START ON 04/12/2020] levETIRAcetam  250 mg Oral Q M,W,F-HD  . levETIRAcetam  500 mg Oral BID  . mirtazapine  7.5 mg Oral QHS  . omega-3 acid ethyl esters  2 g Oral Daily  . pantoprazole  40 mg Oral Daily  . rOPINIRole  0.25 mg Oral QHS  . sevelamer carbonate  1,600 mg Oral TID WC  . sodium chloride flush  3 mL Intravenous Q12H  . sodium chloride flush  3 mL Intravenous Q12H  . timolol  1 drop Both Eyes TID  . triamcinolone   Topical BID  . valACYclovir  500 mg Oral Daily  . vitamin B-12  1,000 mcg Oral Daily   Continuous Infusions: . sodium chloride    . cefTRIAXone (ROCEPHIN)  IV     PRN Meds:.sodium chloride, meclizine, ondansetron **OR** ondansetron (ZOFRAN) IV, polyvinyl alcohol, sodium chloride flush    Assessment/ Plan:  [name] is a 68 y.o. black female with end stage renal disease on hemodialysis, peripheral vascular disease status post bilateral BKA, diabetes mellitus type II, coronary artery disease status post CABG admitted to [facility] on 04/10/2020 for Slurred speech [R47.81] Altered mental status, unspecified altered mental status type [R41.82] AMS (altered mental status) [R41.82]  [facility] MWF [facility] [address] [facility] permcath [phone]  #  End Stage Renal Disease We will continue MWF schedule Tolerating dialysis well via PermCath   # Anemia of chronic kidney disease Lab Results  Component Value Date   HGB 12.1 04/11/2020   No indication for Epogen  #Secondary Hyperparathyroidism:  Lab  Results  Component Value Date   PTH 385 (H) 09/03/2019   CALCIUM 8.7 (L) 04/11/2020   PHOS 4.2 04/11/2020  Will continue monitoring bone mineral metabolism parameters  #Shingles   Patient is on valacyclovir  #Altered mental status Per notes, at baseline patient is independently able to put on her prostheses and walk with a walker.  She uses active transportation for dialysis. Work-up is in progress     LOS: 1 Solomiya Pascale 12/21/20212:00 PM

## 2020-04-11 NOTE — Plan of Care (Signed)

## 2020-04-11 NOTE — Procedures (Signed)
Pt in dialysis at this time

## 2020-04-11 NOTE — Consult Note (Signed)
Reason for Consult:AMS Requesting Physician: Florene Glen  CC: AMS  I have been asked by Dr. Florene Glen to see this patient in consultation for AMS.  HPI: Stephanie Ellis is an 69 y.o. female  with medical history significant for seizure disorder, diabetes mellitus with complications of ESRD on HD (M/W/F) , hypertension, PAD s/p bilateral BKA, coronary artery disease status post CABG who was brought to the emergency room by EMS for evaluation of mental status changes.  Patient was recently discharged from the hospital on 04/06/20.  During that hospitalization she had a brief PEA arrest after she received hydromorphone and recovered immediately following administration of Narcan.  Patient also treated for chest wall shingles outbreak during that admission.   Her daughter states that since her discharge she has not been her usual self and has been acting very erratic.  At baseline patient is able to take care of herself and perform activities of daily living such as transfers, grooming and feeding herself.  Since her discharge she has been very weak and has been unable to feed herself or transfer to her wheelchair.  Family is also concerned that her speech is slurred.  Patient refused to go to dialysis on the morning of her admission and so EMS was called.   Past Medical History:  Diagnosis Date  . (HFpEF) heart failure with preserved ejection fraction ([place])    a. 01/2018 Echo: EF 50-55%, no rwma, Gr1 DD, triv AI, mild MR. Midly dil LA/RV, mod reduced RV fxn. Irreg thickening of TV w/ mobile echodensity that appears to arise from valve.  . Anemia   . Anorexia   . Bacteremia due to Pseudomonas   . Carotid arterial disease ([place])    a. 01/2017 Carotid U/S: RICA <56, LICA <97.  Marland Kitchen CHF (congestive heart failure) ([place])   . Complication of anesthesia    a. Pt reports h/o complication on 9 different occasions - ? hypotension/arrest.  . 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 01/2015  Cath [facility]): 70% LM, p-mLAD 50-60% (Resting FFR 0.75), mRCA 80-90%, ~40 Ost OM & D1-->CABG; b. 01/2016 Staged PCI of LCX x 2 and RCA.  Marland Kitchen ESRD (end stage renal disease) on dialysis ([place])    a. ESRD secondary to acute kidney failure s/p CABG-->PD  . Essential hypertension   . GERD (gastroesophageal reflux disease)   . Heart murmur   . Hypercholesterolemia   . Myocardial infarction ([place]) 2016  . PVD (peripheral vascular disease) ([place])    a. 01/30/2018 PV Angio: Sev Left Popliteal dzs s/p PTA w/ 60m lutonix DEB w/ mech aspiration of the L popliteal, L AT, and Left tibioperoneal trunck and peroneal artery.  . 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  [facility]: Dr. BMarland KitchenHaithcock: CABG x 3, LIMA to LAD, SVG to RCA, SVG to OM3, EVH  . Sinusitis 2019  . Type II diabetes mellitus with complication (HCC)    CAD    Past Surgical History:  Procedure Laterality Date  . ABDOMINAL HYSTERECTOMY    . AMPUTATION Right 02/25/2018   Procedure: AMPUTATION BELOW KNEE;  Surgeon: SKatha Cabal MD;  Location: [facility];  Service: Vascular;  Laterality: Right;  . AMPUTATION Left 03/11/2018   Procedure: AMPUTATION BELOW KNEE;  Surgeon: DAlgernon Huxley MD;  Location: [facility];  Service: Vascular;  Laterality: Left;  . AMPUTATION Bilateral 04/13/2018   Procedure: REVISION OF BILATERAL AMPUTATIONS BELOW KNEE WITH WOUND VAC APPLICATIONS;  Surgeon: EEvaristo Bury MD;  Location: [facility];  Service: Vascular;  Laterality: Bilateral;  . AMPUTATION TOE Left 02/06/2018   Procedure: AMPUTATION GREAT TOE;  Surgeon: Samara Deist, DPM;  Location: [facility];  Service: Podiatry;  Laterality: Left;  . APPLICATION OF WOUND VAC Left 04/13/2018   Procedure: APPLICATION OF WOUND VAC;  Surgeon: Evaristo Bury, MD;  Location: [facility];  Service: Vascular;  Laterality: Left;  . ARTERIOVENOUS GRAFT PLACEMENT  05/2016  . AV FISTULA PLACEMENT Left 05/30/2016   Procedure: ARTERIOVENOUS graft;  Surgeon: Algernon Huxley, MD;  Location: [facility];   Service: Vascular;  Laterality: Left;  . CAPD INSERTION N/A 10/30/2017   Procedure: LAPAROSCOPIC INSERTION CONTINUOUS AMBULATORY PERITONEAL DIALYSIS  (CAPD) CATHETER;  Surgeon: Algernon Huxley, MD;  Location: [facility];  Service: Vascular;  Laterality: N/A;  . CARDIAC CATHETERIZATION  01/2015   [facility]: Ost LM 70%, p-m LAD 50-60% (Rest FFR + @ 0.75), mRCA 80-90%, ostD1 40%, pOM1 40%  . CATARACT EXTRACTION W/PHACO Left 01/18/2016   Procedure: CATARACT EXTRACTION PHACO AND INTRAOCULAR LENS PLACEMENT (IOC);  Surgeon: Eulogio Bear, MD;  Location: [facility];  Service: Ophthalmology;  Laterality: Left;  [place] 1.05 AP% 15.5 CDE 10.16 Fluid Pack Lot # [id number]  . CATARACT EXTRACTION W/PHACO Right 08/01/2016   Procedure: CATARACT EXTRACTION PHACO AND INTRAOCULAR LENS PLACEMENT (IOC);  Surgeon: Eulogio Bear, MD;  Location: [facility];  Service: Ophthalmology;  Laterality: Right;  [place] 01:00.6 AP% 11.4 CDE 6.93  LOT # [id number]  . COLONOSCOPY    . CORONARY ANGIOPLASTY     SENTS 02/12/16  . CORONARY ARTERY BYPASS GRAFT  03/28/15    [facility]: Dr. Waldemar Dickens: LIMA to LAD, SVG to RCA, SVG to OM3, EVH  . DIALYSIS/PERMA CATHETER INSERTION N/A 02/11/2018   Procedure: DIALYSIS/PERMA CATHETER INSERTION;  Surgeon: Algernon Huxley, MD;  Location: [place] [facility];  Service: Cardiovascular;  Laterality: N/A;  . DIALYSIS/PERMA CATHETER REMOVAL Right 02/04/2018   Procedure: DIALYSIS/PERMA CATHETER REMOVAL;  Surgeon: Katha Cabal, MD;  Location: [place] [facility];  Service: Cardiovascular;  Laterality: Right;  . ESOPHAGOGASTRODUODENOSCOPY (EGD) WITH PROPOFOL N/A 10/24/2016   Procedure: ESOPHAGOGASTRODUODENOSCOPY (EGD) WITH PROPOFOL;  Surgeon: [name], MD;  Location: [facility];  Service: Endoscopy;  Laterality: N/A;  . EYE SURGERY Bilateral    cataract surgery  . EYE SURGERY     drains for glaucoma  . INSERTION EXPRESS TUBE SHUNT Right 08/01/2016   Procedure: INSERTION EXPRESS TUBE SHUNT;  Surgeon: Eulogio Bear, MD;  Location: [facility];  Service: Ophthalmology;  Laterality: Right;  . INSERTION OF AHMED VALVE Left 08/15/2016   Procedure: INSERTION OF AHMED VALVE;  Surgeon: Eulogio Bear, MD;  Location: [facility];  Service: Ophthalmology;  Laterality: Left;  . INSERTION OF DIALYSIS CATHETER    . LOWER EXTREMITY ANGIOGRAPHY Right 02/19/2018   Procedure: Lower Extremity Angiography;  Surgeon: Algernon Huxley, MD;  Location: [place] [facility];  Service: Cardiovascular;  Laterality: Right;  . LOWER EXTREMITY ANGIOGRAPHY Left 01/30/2018   Procedure: Lower Extremity Angiography;  Surgeon: Katha Cabal, MD;  Location: [place] [facility];  Service: Cardiovascular;  Laterality: Left;  . REMOVAL OF A DIALYSIS CATHETER N/A 02/25/2018   Procedure: REMOVAL OF A DIALYSIS CATHETER;  Surgeon: Katha Cabal, MD;  Location: [facility];  Service: Vascular;  Laterality: N/A;  . TEMPORARY DIALYSIS CATHETER N/A 02/06/2018   Procedure: TEMPORARY DIALYSIS CATHETER;  Surgeon: Katha Cabal, MD;  Location: [place] [facility];  Service: Cardiovascular;  Laterality: N/A;  . TRANSTHORACIC ECHOCARDIOGRAM  January 2017    EF 60-65%. GR 2 DD. Mild degenerative mitral valve disease but no prolapse or regurgitation. Mild left atrial dilation. Mild to moderate LVH. Pericardial effusion gone  . UPPER EXTREMITY ANGIOGRAPHY Left 06/27/2016   Procedure: Upper Extremity Angiography;  Surgeon: Algernon Huxley, MD;  Location: [place] [facility];  Service: Cardiovascular;  Laterality: Left;  . UPPER EXTREMITY ANGIOGRAPHY Left 08/22/2016   Procedure: Upper Extremity Angiography;  Surgeon: Algernon Huxley, MD;  Location: [place] [facility];  Service: Cardiovascular;  Laterality: Left;  Marland Kitchen VASCULAR SURGERY    . WOUND DEBRIDEMENT Left 05/08/2018   Procedure: DEBRIDEMENT OF LEFT LEG BKA;  Surgeon: Algernon Huxley, MD;  Location: [facility];  Service: General;  Laterality: Left;    Family History  Problem Relation Age of Onset  .  Diabetes Mellitus II Mother   . Heart failure Mother   . Pancreatic cancer Father     Social History:  reports that she has never smoked. She has never used smokeless tobacco. She reports that she does not drink alcohol and does not use drugs.  Allergies  Allergen Reactions  . Chlorthalidone Anaphylaxis, Itching and Rash  . Dilaudid [Hydromorphone Hcl] Anaphylaxis    Hypersensitivity. 0.67m caused pt to become unresponsive  . Fentanyl Rash  . Midazolam Rash  . Morphine And Related Anaphylaxis    Pt unresponsive, apneic after 0.513mdilaudid   . Ace Inhibitors Other (See Comments)    Reaction:  Hyperkalemia, agitation   . Angiotensin Receptor Blockers Other (See Comments)    Reaction:  Hyperkalemia, agitation   . Metoclopramide Other (See Comments)  . Norvasc [Amlodipine] Itching and Rash  . Phenergan [Promethazine Hcl] Anxiety    "antsy, can't sit still"    Medications:  I have reviewed the patient's current medications. Prior to Admission:  Medications Prior to Admission  Medication Sig Dispense Refill Last Dose  . apixaban (ELIQUIS) 2.5 MG TABS tablet Take 1 tablet (2.5 mg total) by mouth 2 (two) times daily. 60 tablet 0 04/10/2020 at 0930  . atorvastatin (LIPITOR) 80 MG tablet Take 80 mg by mouth at bedtime.    04/09/2020 at 2000  . brimonidine (ALPHAGAN) 0.2 % ophthalmic solution Place 1 drop into both eyes 3 (three) times daily. 5 mL 12 04/10/2020 at 0930  . carvedilol (COREG) 12.5 MG tablet Take 12.5 mg by mouth 2 (two) times daily. Take in combination with 25 mg for total of 37.5 mg twice daily   04/10/2020 at 0930  . carvedilol (COREG) 25 MG tablet Take 1 tablet (25 mg total) by mouth 2 (two) times daily. 60 tablet 0 04/10/2020 at 0930  . fluticasone (FLONASE) 50 MCG/ACT nasal spray Place 2 sprays into both nostrils daily.   04/10/2020 at 0930  . hydrALAZINE (APRESOLINE) 50 MG tablet Take 1 tablet (50 mg total) by mouth every 8 (eight) hours. 90 tablet 1 04/10/2020 at 0930   . insulin lispro (HUMALOG) 100 UNIT/ML KwikPen Inject 2-6 Units into the skin 3 (three) times daily before meals.   Past Week at Unknown time  . ipratropium (ATROVENT) 0.06 % nasal spray Place 2 sprays into both nostrils 2 (two) times daily. 15 mL 0 Past Week at Unknown time  . isosorbide mononitrate (IMDUR) 30 MG 24 hr tablet Take 30 mg by mouth daily.    04/10/2020 at 0930  . levETIRAcetam (KEPPRA) 250 MG tablet Three times weekly after dialysis (Patient taking differently: Take  250 mg by mouth Every Tuesday,Thursday,and Saturday with dialysis.)   Past Week at Unknown time  . levETIRAcetam (KEPPRA) 500 MG tablet Take 500 mg by mouth 2 (two) times daily.   04/10/2020 at 0930  . mirtazapine (REMERON) 15 MG tablet Take 7.5 mg by mouth daily.    04/09/2020 at 2000  . Omega-3 1000 MG CAPS Take 2 capsules by mouth daily.    Past Week at Unknown time  . pantoprazole (PROTONIX) 40 MG tablet Take 1 tablet (40 mg total) by mouth daily. 30 tablet 11 04/10/2020 at 0930  . rOPINIRole (REQUIP) 0.25 MG tablet Take 0.25 mg by mouth at bedtime.   04/09/2020 at 2000  . sevelamer carbonate (RENVELA) 800 MG tablet 2 Tablet(s) By Mouth 3 Times Daily   04/10/2020 at 0930  . timolol (TIMOPTIC) 0.5 % ophthalmic solution Place 1 drop into both eyes 3 (three) times daily. 10 mL 12 04/10/2020 at 0930  . triamcinolone cream (KENALOG) 0.1 % Apply topically.   04/10/2020 at 0930  . valACYclovir (VALTREX) 500 MG tablet Take 1 tablet (500 mg total) by mouth daily for 6 days. 6 tablet 0 04/10/2020 at 0930  . vitamin B-12 (CYANOCOBALAMIN) 1000 MCG tablet Take 1 tablet (1,000 mcg total) by mouth daily. 30 tablet 0 Past Week at Unknown time  . aspirin EC 81 MG tablet Take 1 tablet (81 mg total) by mouth daily. (Patient not taking: No sig reported)   Not Taking at Unknown time  . cloNIDine (CATAPRES) 0.1 MG tablet Take 0.1 mg by mouth daily as needed (BP >160/80). (Patient not taking: No sig reported)   Not Taking at Unknown time  .  insulin aspart (NOVOLOG) 100 UNIT/ML injection Inject 0-6 Units into the skin See admin instructions. Inject under the skin according to blood glucose reading:  <201: 0u 201- 250: 2u 251-300: 4u 301-350: 5u 351-400: 6u (Patient not taking: No sig reported)   Not Taking at Unknown time  . insulin glargine (LANTUS) 100 UNIT/ML injection Inject 5 Units into the skin daily in the afternoon.      . meclizine (ANTIVERT) 25 MG tablet Take 25 mg by mouth every 4 (four) hours as needed for dizziness.   unknown at prn  . Polyvinyl Alcohol-Povidone PF (REFRESH) 1.4-0.6 % SOLN Place 1 drop into both eyes 2 (two) times a day.    unknown at prn  . traMADol (ULTRAM) 50 MG tablet Take 1 tablet (50 mg total) by mouth every 12 (twelve) hours as needed for up to 5 days for moderate pain or severe pain. (Patient not taking: No sig reported) 10 tablet 0 Not Taking at Unknown time   Scheduled: . apixaban  2.5 mg Oral BID  . aspirin EC  81 mg Oral Daily  . atorvastatin  80 mg Oral QHS  . brimonidine  1 drop Both Eyes TID  . carvedilol  37.5 mg Oral BID WC  . Chlorhexidine Gluconate Cloth  6 each Topical Q0600  . doxycycline  100 mg Oral Q12H  . fluticasone  2 spray Each Nare Daily  . hydrALAZINE  50 mg Oral Q8H  . insulin aspart  0-6 Units Subcutaneous TID WC  . ipratropium  2 spray Each Nare BID  . isosorbide mononitrate  30 mg Oral Daily  . [START ON 04/12/2020] levETIRAcetam  250 mg Oral Q M,W,F-HD  . levETIRAcetam  500 mg Oral BID  . mirtazapine  7.5 mg Oral QHS  . omega-3 acid ethyl esters  2 g  Oral Daily  . pantoprazole  40 mg Oral Daily  . rOPINIRole  0.25 mg Oral QHS  . sevelamer carbonate  1,600 mg Oral TID WC  . sodium chloride flush  3 mL Intravenous Q12H  . sodium chloride flush  3 mL Intravenous Q12H  . timolol  1 drop Both Eyes TID  . triamcinolone   Topical BID  . valACYclovir  500 mg Oral Daily  . vitamin B-12  1,000 mcg Oral Daily    ROS: History obtained from the  patient  General ROS: weakness Psychological ROS: negative for - behavioral disorder, hallucinations, memory difficulties, mood swings or suicidal ideation Ophthalmic ROS: negative for - blurry vision, double vision, eye pain or loss of vision ENT ROS: negative for - epistaxis, nasal discharge, oral lesions, sore throat, tinnitus or vertigo Allergy and Immunology ROS: negative for - hives or itchy/watery eyes Hematological and Lymphatic ROS: negative for - bleeding problems, bruising or swollen lymph nodes Endocrine ROS: negative for - galactorrhea, hair pattern changes, polydipsia/polyuria or temperature intolerance Respiratory ROS: negative for - cough, hemoptysis, shortness of breath or wheezing Cardiovascular ROS: negative for - chest pain, dyspnea on exertion, edema or irregular heartbeat Gastrointestinal ROS: negative for - abdominal pain, diarrhea, hematemesis, nausea/vomiting or stool incontinence Genito-Urinary ROS: negative for - dysuria, hematuria, incontinence or urinary frequency/urgency Musculoskeletal ROS: bilateral LE amputations Neurological ROS: as noted in HPI Dermatological ROS: negative for rash and skin lesion changes  Physical Examination: Blood pressure (!) 197/82, pulse 65, temperature 97.6 F (36.4 C), temperature source Axillary, resp. rate 19, height 4' 4"  (1.321 m), weight 50 kg, SpO2 99 %.  HEENT-  Normocephalic, no lesions, without obvious abnormality.  Normal external eye and conjunctiva.  Normal TM's bilaterally.  Normal auditory canals and external ears. Normal external nose, mucus membranes and septum.  Normal pharynx. Cardiovascular- S1, S2 normal, pulses palpable throughout   Lungs- chest clear, no wheezing, rales, normal symmetric air entry Abdomen- soft, non-tender; bowel sounds normal; no masses,  no organomegaly Extremities- BLE amputations Lymph-no adenopathy palpable Musculoskeletal-no joint tenderness, deformity or swelling   Neurological  Examination   Mental Status: Alert, oriented to name and that in her room but will not tell me where the room is.  Knows her age and the year but perseverates on the year when asked other orientation questions.  Speech fluent without evidence of aphasia.  Able to follow commands.  Dysarthric. Cranial Nerves: II: Decreased peripheral vision bilaterally III,IV, VI: Extra-ocular motions intact bilaterally V,VII: decreased right NLF, facial light touch sensation normal bilaterally VIII: hearing normal bilaterally IX,X: gag reflex present XI: bilateral shoulder shrug XII: midline tongue extension Motor: 5/5 strength in BUE's.  LE amputations Sensory: Pinprick and light touch intact throughout, bilaterally Deep Tendon Reflexes: Symmetric in the upper extremities Plantars: Amputee Cerebellar: Normal finger-to-nose bilaterally Gait: not tested due to amputations  Laboratory Studies:   Basic Metabolic Panel: Recent Labs  Lab 04/05/20 1003 04/10/20 1127 04/10/20 1129 04/11/20 0403  NA 135  135 140  --  139  K 4.7  5.0 4.8  --  4.6  CL 98  96* 97*  --  99  CO2 25  27 28   --  24  GLUCOSE 178*  181* 186*  --  121*  BUN 41*  42* 53*  --  57*  CREATININE 7.86*  7.89* 9.33*  --  10.31*  CALCIUM 8.9  8.9 8.9  --  8.7*  MG  --   --  2.2  --  PHOS 4.6  --   --  4.2    Liver Function Tests: Recent Labs  Lab 04/05/20 1003 04/10/20 1127  AST 16 17  ALT 17 13  ALKPHOS 91 92  BILITOT 0.6 0.6  PROT 6.8 7.4  ALBUMIN 3.4*  3.4* 3.7   No results for input(s): LIPASE, AMYLASE in the last 168 hours. Recent Labs  Lab 04/10/20 1129  AMMONIA 19    CBC: Recent Labs  Lab 04/05/20 0414 04/10/20 1127 04/11/20 0403  WBC 5.6 4.2 3.9*  NEUTROABS  --  3.0  --   HGB 12.2 13.4 12.1  HCT 39.7 43.2 39.6  MCV 99.0 96.4 98.3  PLT 81* 113* 93*    Cardiac Enzymes: No results for input(s): CKTOTAL, CKMB, CKMBINDEX, TROPONINI in the last 168 hours.  BNP: Invalid input(s):  POCBNP  CBG: Recent Labs  Lab 04/06/20 1200 04/10/20 1106 04/10/20 1727 04/10/20 2046 04/11/20 0843  GLUCAP 164* 171* 146* 133* 120*    Microbiology: Results for orders placed or performed during the hospital encounter of 04/10/20  Resp Panel by RT-PCR (Flu A&B, Covid) Nasopharyngeal Swab     Status: None   Collection Time: 04/10/20 12:58 PM   Specimen: Nasopharyngeal Swab; Nasopharyngeal(NP) swabs in vial transport medium  Result Value Ref Range Status   SARS Coronavirus 2 by RT PCR NEGATIVE NEGATIVE Final    Comment: (NOTE) SARS-CoV-2 target nucleic acids are NOT DETECTED.  The SARS-CoV-2 RNA is generally detectable in upper respiratory specimens during the acute phase of infection. The lowest concentration of SARS-CoV-2 viral copies this assay can detect is 138 copies/mL. A negative result does not preclude SARS-Cov-2 infection and should not be used as the sole basis for treatment or other patient management decisions. A negative result may occur with  improper specimen collection/handling, submission of specimen other than nasopharyngeal swab, presence of viral mutation(s) within the areas targeted by this assay, and inadequate number of viral copies(<138 copies/mL). A negative result must be combined with clinical observations, patient history, and epidemiological information. The expected result is Negative.  Fact Sheet for Patients:  EntrepreneurPulse.com.au  Fact Sheet for Healthcare Providers:  IncredibleEmployment.be  This test is no t yet approved or cleared by the [place] FDA and  has been authorized for detection and/or diagnosis of SARS-CoV-2 by FDA under an Emergency Use Authorization (EUA). This EUA will remain  in effect (meaning this test can be used) for the duration of the COVID-19 declaration under Section 564(b)(1) of the Act, 21 U.S.C.section 360bbb-3(b)(1), unless the authorization is terminated  or  revoked sooner.       Influenza A by PCR NEGATIVE NEGATIVE Final   Influenza B by PCR NEGATIVE NEGATIVE Final    Comment: (NOTE) The Xpert Xpress SARS-CoV-2/FLU/RSV plus assay is intended as an aid in the diagnosis of influenza from Nasopharyngeal swab specimens and should not be used as a sole basis for treatment. Nasal washings and aspirates are unacceptable for Xpert Xpress SARS-CoV-2/FLU/RSV testing.  Fact Sheet for Patients: EntrepreneurPulse.com.au  Fact Sheet for Healthcare Providers: IncredibleEmployment.be  This test is not yet approved or cleared by the [place] FDA and has been authorized for detection and/or diagnosis of SARS-CoV-2 by FDA under an Emergency Use Authorization (EUA). This EUA will remain in effect (meaning this test can be used) for the duration of the COVID-19 declaration under Section 564(b)(1) of the Act, 21 U.S.C. section 360bbb-3(b)(1), unless the authorization is terminated or revoked.  Performed at [facility], [address] [place], [place] [address]     Coagulation Studies: No results for input(s): LABPROT, INR in the last 72 hours.  Urinalysis: No results for input(s): COLORURINE, LABSPEC, PHURINE, GLUCOSEU, HGBUR, BILIRUBINUR, KETONESUR, PROTEINUR, UROBILINOGEN, NITRITE, LEUKOCYTESUR in the last 168 hours.  Invalid input(s): APPERANCEUR  Lipid Panel:     Component Value Date/Time   CHOL 153 07/08/2018 1357   TRIG 313 (H) 07/08/2018 1357   HDL 35 (L) 07/08/2018 1357   CHOLHDL 4.4 07/08/2018 1357   VLDL 63 (H) 07/08/2018 1357   LDLCALC 55 07/08/2018 1357    HgbA1C:  Lab Results  Component Value Date   HGBA1C 7.8 (H) 04/02/2020    Urine Drug Screen:  No results found for: LABOPIA, COCAINSCRNUR, LABBENZ, AMPHETMU, THCU, LABBARB  Alcohol Level:  Recent Labs  Lab 04/10/20 [place] <10    Other results: EKG: normal sinus rhythm with sinus arhythmia at 72 bpm with  RBBB.  Imaging: DG Chest 2 View  Result Date: 04/10/2020 CLINICAL DATA:  Altered mental status EXAM: CHEST - 2 VIEW COMPARISON:  April 02, 2020 FINDINGS: Central catheter tip is in the right atrium, slightly distal to the cavoatrial junction. No pneumothorax. There is a small area of ill-defined opacity in the lateral right base. Lungs elsewhere are clear. There is cardiomegaly with pulmonary vascular normal. Patient is status post coronary artery bypass grafting. There is aortic atherosclerosis. No bone lesions. IMPRESSION: Central catheter as described without pneumothorax. Small area of suspected pneumonia lateral right base. Lungs elsewhere clear. There is cardiomegaly with postoperative coronary artery bypass grafting change. Aortic Atherosclerosis (ICD10-I70.0). Electronically Signed   By: Lowella Grip III M.D.   On: 04/10/2020 12:19   CT Head Wo Contrast  Result Date: 04/10/2020 CLINICAL DATA:  Altered mental status EXAM: CT HEAD WITHOUT CONTRAST TECHNIQUE: Contiguous axial images were obtained from the base of the skull through the vertex without intravenous contrast. COMPARISON:  05/01/2019 FINDINGS: Brain: No evidence of acute infarction, hemorrhage, hydrocephalus, extra-axial collection or mass lesion/mass effect. Scattered low-density changes within the periventricular and subcortical white matter compatible with chronic microvascular ischemic change. Mild diffuse cerebral volume loss. Vascular: Atherosclerotic calcifications involving the large vessels of the skull base. No unexpected hyperdense vessel. Skull: Normal. Negative for fracture or focal lesion. Sinuses/Orbits: Chronic mucosal thickening within the left sphenoid sinus and posterior left ethmoid air cells. Remaining paranasal sinuses and mastoid air cells are clear. Other: None. IMPRESSION: 1. No acute intracranial findings. 2. Chronic microvascular ischemic change and cerebral volume loss. Electronically Signed   By:  Davina Poke D.O.   On: 04/10/2020 12:29   CT CHEST WO CONTRAST  Result Date: 04/10/2020 CLINICAL DATA:  Altered mental status EXAM: CT CHEST WITHOUT CONTRAST TECHNIQUE: Multidetector CT imaging of the chest was performed following the standard protocol without IV contrast. COMPARISON:  Chest radiograph April 10, 2020; chest CT Sep 02, 2019 FINDINGS: Note that there is a degree of motion artifact. Cardiovascular: There is no thoracic aortic aneurysm. There are scattered foci of calcification in visualized great vessels. There is aortic atherosclerosis as well as multiple foci of coronary artery calcification. There is cardiomegaly. No pericardial effusion or pericardial thickening. Central catheter tip is in the right atrium, slightly beyond the cavoatrial junction. Mediastinum/Nodes: Thyroid appears unremarkable. No appreciable thoracic adenopathy. No esophageal lesions are evident. Lungs/Pleura: There is atelectatic change in the lung bases. There is suspected loculated effusion along the lateral aspect of the right major fissure. No frank airspace consolidation. No appreciable  pleural effusions. No pneumothorax. Trachea and major bronchial structures appear patent, although there is a degree of motion artifact. Upper Abdomen: There is upper abdominal aortic as well as multiple mesenteric arterial vascular calcifications. Visualized upper abdominal structures otherwise appear unremarkable. Musculoskeletal: Status post median sternotomy. No blastic or lytic bone lesions evident. No evident chest wall lesions. IMPRESSION: 1. Study somewhat less than optimal due to a degree of motion artifact. 2. Areas of atelectatic change. Mild loculated effusion along the lateral aspect of the right major fissure. No edema or consolidation. 3.  No evident adenopathy. 4. Aortic atherosclerosis as well as foci of great vessel and coronary artery calcification. There is a degree of cardiomegaly. Central catheter tip in  right atrium. Aortic Atherosclerosis (ICD10-I70.0). Electronically Signed   By: Lowella Grip III M.D.   On: 04/10/2020 14:59   MR BRAIN WO CONTRAST  Result Date: 04/10/2020 CLINICAL DATA:  Mental status change EXAM: MRI HEAD WITHOUT CONTRAST TECHNIQUE: Multiplanar, multiecho pulse sequences of the brain and surrounding structures were obtained without intravenous contrast. COMPARISON:  March 2020 FINDINGS: Motion artifact is present. Brain: There is no acute infarction or intracranial hemorrhage. There is no intracranial mass, mass effect, or edema. There is no hydrocephalus or extra-axial fluid collection. Prominence of the ventricles and sulci reflects generalized parenchymal volume loss. Patchy and confluent areas of T2 hyperintensity in the supratentorial white matter are nonspecific but probably reflect moderate chronic microvascular ischemic changes. 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 Small chronic infarcts of the cerebellum. There are a few scattered small foci of susceptibility hypointensity in the cerebral white matter likely reflecting chronic microhemorrhages. Vascular: Major vessel flow voids at the skull base are preserved. Skull and upper cervical spine: Normal marrow signal is preserved. Sinuses/Orbits: Patchy paranasal sinus mucosal thickening, including chronic left sphenoid mucosal thickening. Bilateral lens replacements. Other: Sella is unremarkable.  Patchy mastoid fluid opacification. IMPRESSION: No acute infarction, hemorrhage, or mass. Chronic microvascular ischemic changes and few small chronic infarcts. No substantial change since 2020. Electronically Signed   By: Macy Mis M.D.   On: 04/10/2020 19:56     Assessment/Plan: 69 y.o. female  with medical history significant for seizure disorder, diabetes mellitus with complications of ESRD on HD (M/W/F) , hypertension, PAD s/p bilateral BKA, coronary artery disease status post CABG who was brought  to the emergency room by EMS for evaluation of mental status changes.  Patient was recently discharged from the hospital on 04/06/20.  During that hospitalization she had a brief PEA arrest after she received hydromorphone and recovered immediately following administration of Narcan.  Patient also treated for chest wall shingles outbreak during that admission.   Her daughter states that since her discharge she has not been her usual self and has been acting very erratic.  Initial blood work was unremarkable including serum ammonia.  Patient without an elevated white blood cell count and has had no fever.  On Valacyclovir for shingles.  Despite recent history of shingles current presentation not particularly suggestive of a herpes encephalitis.  Patient also on Eliquis which would make LP prohibitive for at least 48 hours.  After chart review, unclear why patient is on Eliquis.  MRI of the brain personally reviewed and shows no evidence of temporal abnormalities to suggest herpes encephalitis or other acute changes to suggest stroke or anoxic brain injury.     Recommendations: 1. Keppra level, TSH, B1, B12 2. EEG 3. Would not suggest discontinuing Eliquis for LP  at this time unless work up otherwise suggestive of encephalitis.   4. High dose thiamine supplementation while awaiting level.     Alexis Goodell, MD Neurology  04/11/2020, 5:41 PM

## 2020-04-11 NOTE — Progress Notes (Signed)
eeg done °

## 2020-04-11 NOTE — TOC Initial Note (Signed)
Transition of Care [facility]) - Initial/Assessment Note    Patient Details  Name: Stephanie Ellis MRN: [id number] Date of Birth: 12-28-1950  [facility] CM/SW Contact:    Shelbie Hutching, RN Phone Number: 04/11/2020, 10:01 AM  Clinical Narrative:                 Patient admitted to the hospital with altered mental status and slurred speech.  RNCM met with patient at the beside but patient is not able to participate in a conversation.  Patient has a 1:1 sitter due to restlessness and trying to get out of the bed.   RNCM was able to speak with patient's daughter, [name], via phone.  [name] reports that patient lives at home with her husband.  At baseline patient is independent able to put on her prosthetics, walk with a walker, bath and dress herself and get herself out to her transport for dialysis.   Patient uses [facility] for transportation.  MWF dialysis.  PCP is Kearney Hard at [facility].    Daughters do not want the patient to return home without help, she has been at [facility] and ALF in the past.  PT is going to evaluate. TOC will follow for recommendations.   Expected Discharge Plan: Skilled Nursing Facility Barriers to Discharge: Continued Medical Work up   Patient Goals and CMS Choice Patient states their goals for this hospitalization and ongoing recovery are:: Patient is unable to state goals.  Daughter does not want patient to go home alone CMS Medicare.gov Compare Post Acute Care list provided to:: Patient Represenative (must comment) Choice offered to / list presented to : Adult Children  Expected Discharge Plan and Services Expected Discharge Plan: [place]   Discharge Planning Services: CM Consult                                          Prior Living Arrangements/Services   Lives with:: Spouse Patient language and need for interpreter reviewed:: Yes Do you feel safe going back to the place where you live?: No   Patient's mental  status is not at baseline  Need for Family Participation in Patient Care: Yes (Comment) (altered mental status) Care giver support system in place?: Yes (comment) (daughter's) Current home services: DME (walker and wheelchair) Criminal Activity/Legal Involvement Pertinent to Current Situation/Hospitalization: No - Comment as needed  Activities of Daily Living Home Assistive Devices/Equipment: Wheelchair ADL Screening (condition at time of admission) Patient's cognitive ability adequate to safely complete daily activities?: No Is the patient deaf or have difficulty hearing?: No Does the patient have difficulty seeing, even when wearing glasses/contacts?: No Does the patient have difficulty concentrating, remembering, or making decisions?: Yes Patient able to express need for assistance with ADLs?: Yes Does the patient have difficulty dressing or bathing?: Yes Independently performs ADLs?: No Communication: Independent Dressing (OT): Needs assistance Is this a change from baseline?: Change from baseline, expected to last >3 days Grooming: Needs assistance Is this a change from baseline?: Change from baseline, expected to last >3 days Feeding: Independent Bathing: Needs assistance Is this a change from baseline?: Change from baseline, expected to last >3 days Toileting: Needs assistance Is this a change from baseline?: Pre-admission baseline In/Out Bed: Needs assistance Is this a change from baseline?: Change from baseline, expected to last >3 days Walks in Home: Needs assistance Is this a change  from baseline?: Change from baseline, expected to last >3 days Does the patient have difficulty walking or climbing stairs?: Yes Weakness of Legs: None Weakness of Arms/Hands: None  Permission Sought/Granted Permission sought to share information with : Case Manager,Facility Contact Representative,Family Supports Permission granted to share information with : Yes, Verbal Permission  Granted  Share Information with NAME: [name] and [place] granted to share info w AGENCY: SNF or [place] agency  Permission granted to share info w Relationship: daughters     Emotional Assessment Appearance:: Appears stated age Attitude/Demeanor/Rapport: Inconsistent,Uncooperative Affect (typically observed): Restless,Agitated Orientation: : Oriented to Self Alcohol / Substance Use: Not Applicable Psych Involvement: No (comment)  Admission diagnosis:  Slurred speech [R47.81] Altered mental status, unspecified altered mental status type [R41.82] AMS (altered mental status) [R41.82] Patient Active Problem List   Diagnosis Date Noted  . AMS (altered mental status) 04/10/2020  . Chronic combined systolic and diastolic CHF (congestive heart failure) ([place]) 04/02/2020  . Thrombocytopenia ([place]) 04/02/2020  . Chronic respiratory failure with hypoxia ([place]) 11/03/2019  . Nocturnal hypoxia   . Shortness of breath   . Abdominal swelling   . Acute on chronic systolic CHF (congestive heart failure) ([place])   . Acute respiratory failure with hypoxia ([place]) 09/02/2019  . Acute on chronic combined systolic (congestive) and diastolic (congestive) heart failure ([place]) 09/02/2019  . 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 Severe anemia 05/01/2019  . Pneumonia due to COVID-19 virus 05/01/2019  . Resides in skilled nursing facility 12/29/2018  . CVA (cerebral vascular accident) ([place]) 07/08/2018  . Infection of amputation stump ([place]) 05/02/2018  . BKA stump complication ([place]) 38/88/2800  . Unilateral complete BKA, right, initial encounter ([place]) 03/30/2018  . Transient loss of consciousness 03/19/2018  . Transient alteration of awareness   . VT (ventricular tachycardia) ([place])   . Disorientation   . Hypertensive urgency 03/08/2018  . Status post below-knee amputation of both lower extremities ([place]) 03/02/2018  . Ischemia of extremity 02/18/2018  . CKD (chronic kidney disease) stage 5, GFR less than 15  ml/min (HCC) 02/16/2018  . Diabetic osteomyelitis ([place])   . Peripheral vascular disease ([place])   . Advanced care planning/counseling discussion   . Palliative care by specialist   . Goals of care, counseling/discussion   . Type 2 diabetes mellitus with hyperlipidemia ([place]) 01/30/2018  . Sepsis ([place]) 01/29/2018  . Peripheral neuropathy 06/17/2017  . Seizure disorder ([place]) 02/06/2017  . Accelerated hypertension 02/06/2017  . Endophthalmitis, left eye 12/05/2016  . Vomiting 10/24/2016  . Hematuria 10/17/2016  . Acute lower UTI 10/17/2016  . Anesthesia complication 34/91/7915  . Chest pain 09/04/2016  . Steal syndrome dialysis vascular access, initial encounter ([place]) 06/18/2016  . 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 Coronary artery disease involving coronary bypass graft of native heart with angina pectoris ([place]) 02/10/2016  . Hypertension 02/02/2016  . Hyperlipidemia 02/02/2016  . Coronary artery disease involving left main coronary artery 10/04/2015  . Abdominal pain of unknown etiology 10/04/2015  . ESRD (end stage renal disease) on dialysis ([place])   . Type II diabetes mellitus with complication ([place])   . NSTEMI (non-ST elevated myocardial infarction) ([place])   . Right sided abdominal pain   . Elevated troponin 10/03/2015  . 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 Acute on chronic combined systolic and diastolic CHF (congestive heart failure) ([place]) 04/19/2014  . Anemia 09/20/2013  . Routine health maintenance 01/29/2013   PCP:  Pcp, No Pharmacy:   [facility] [address] ([address]),  [place] ([place]) [place] [address] Phone: [phone] Fax: [phone]     Social Determinants of Health (SDOH) Interventions    Readmission Risk Interventions Readmission Risk Prevention Plan 04/11/2020 05/04/2019  Transportation Screening Complete Complete  PCP or Specialist Appt within 3-5 Days - Complete  Palliative Care  Screening - Not Applicable  Medication Review ([place]) Complete Complete  PCP or Specialist appointment within 3-5 days of discharge Complete -  [place] or Home Care Consult Complete -  SW Recovery Care/Counseling Consult Complete -  Palliative Care Screening Not Applicable -  [place] Complete -  Some recent data might be hidden

## 2020-04-11 NOTE — Progress Notes (Addendum)
Patient had 1:1 Sitter, [employer]. She was at the patients side the entire time. Patient continues to have 1:1 sitter throughout this shift. Patient hasn't listened throughout the night. When patient sits up in bed, she is very unsteady. When told to lay back in bed she states, "I know I am hardheaded, and I just don't want to listen." Tech (1:1 sitter) was speaking to another tech inside of the threshold of the patient's door when she had her back to patient for only seconds.  Patient managed to put her head between side rails and squirm herself into the floor.  Patient's head was not visualized hitting the floor she did roll to her back and then continued to try and sit up as she has the entire night. Patient was in a low bed.   AC: Candice, Charge nurse: Silva Bandy, and X.[place]ount notified, no new orders. Patient was in low bed and near nurses station.

## 2020-04-11 NOTE — Progress Notes (Signed)
Primary RN unable to receive report from HD RN at this time. Debbie, RN states she could take report for the primary RN at this time. Report provided.

## 2020-04-12 DIAGNOSIS — R4182 Altered mental status, unspecified: Secondary | ICD-10-CM | POA: Diagnosis not present

## 2020-04-12 DIAGNOSIS — R410 Disorientation, unspecified: Secondary | ICD-10-CM | POA: Diagnosis not present

## 2020-04-12 LAB — MAGNESIUM: Magnesium: 1.9 mg/dL (ref 1.7–2.4)

## 2020-04-12 LAB — COMPREHENSIVE METABOLIC PANEL
ALT: 10 U/L (ref 0–44)
AST: 17 U/L (ref 15–41)
Albumin: 3.2 g/dL — ABNORMAL LOW (ref 3.5–5.0)
Alkaline Phosphatase: 77 U/L (ref 38–126)
Anion gap: 13 (ref 5–15)
BUN: 25 mg/dL — ABNORMAL HIGH (ref 8–23)
CO2: 26 mmol/L (ref 22–32)
Calcium: 8.6 mg/dL — ABNORMAL LOW (ref 8.9–10.3)
Chloride: 99 mmol/L (ref 98–111)
Creatinine, Ser: 6.32 mg/dL — ABNORMAL HIGH (ref 0.44–1.00)
GFR, Estimated: 7 mL/min — ABNORMAL LOW (ref >60)
Glucose, Bld: 79 mg/dL (ref 70–99)
Potassium: 3.7 mmol/L (ref 3.5–5.1)
Sodium: 138 mmol/L (ref 135–145)
Total Bilirubin: 0.6 mg/dL (ref 0.3–1.2)
Total Protein: 6.6 g/dL (ref 6.5–8.1)

## 2020-04-12 LAB — CBC WITH DIFFERENTIAL/PLATELET
Abs Immature Granulocytes: 0.01 10*3/uL (ref 0.00–0.07)
Basophils Absolute: 0 10*3/uL (ref 0.0–0.1)
Basophils Relative: 0 %
Eosinophils Absolute: 0.1 10*3/uL (ref 0.0–0.5)
Eosinophils Relative: 1 %
HCT: 36.3 % (ref 36.0–46.0)
Hemoglobin: 11.3 g/dL — ABNORMAL LOW (ref 12.0–15.0)
Immature Granulocytes: 0 %
Lymphocytes Relative: 19 %
Lymphs Abs: 0.9 10*3/uL (ref 0.7–4.0)
MCH: 30 pg (ref 26.0–34.0)
MCHC: 31.1 g/dL (ref 30.0–36.0)
MCV: 96.3 fL (ref 80.0–100.0)
Monocytes Absolute: 0.4 10*3/uL (ref 0.1–1.0)
Monocytes Relative: 7 %
Neutro Abs: 3.5 10*3/uL (ref 1.7–7.7)
Neutrophils Relative %: 73 %
Platelets: 97 10*3/uL — ABNORMAL LOW (ref 150–400)
RBC: 3.77 MIL/uL — ABNORMAL LOW (ref 3.87–5.11)
RDW: 16.5 % — ABNORMAL HIGH (ref 11.5–15.5)
WBC: 4.8 10*3/uL (ref 4.0–10.5)
nRBC: 0 % (ref 0.0–0.2)

## 2020-04-12 LAB — RPR: RPR Ser Ql: NONREACTIVE

## 2020-04-12 LAB — PHOSPHORUS: Phosphorus: 3.3 mg/dL (ref 2.5–4.6)

## 2020-04-12 LAB — VITAMIN B12: Vitamin B-12: 2909 pg/mL — ABNORMAL HIGH (ref 180–914)

## 2020-04-12 LAB — GLUCOSE, CAPILLARY
Glucose-Capillary: 86 mg/dL (ref 70–99)
Glucose-Capillary: 98 mg/dL (ref 70–99)
Glucose-Capillary: 88 mg/dL (ref 70–99)

## 2020-04-12 LAB — PROCALCITONIN: Procalcitonin: 0.37 ng/mL

## 2020-04-12 MED ORDER — ISOSORBIDE MONONITRATE ER 30 MG PO TB24
30.0000 mg | ORAL_TABLET | Freq: Once | ORAL | Status: AC
  Administered 2020-04-12: 30 mg via ORAL
  Filled 2020-04-12: qty 1

## 2020-04-12 MED ORDER — ISOSORBIDE MONONITRATE ER 30 MG PO TB24
60.0000 mg | ORAL_TABLET | Freq: Every day | ORAL | Status: DC
Start: 2020-04-13 — End: 2020-04-20
  Administered 2020-04-14 – 2020-04-20 (×6): 60 mg via ORAL
  Filled 2020-04-12 (×7): qty 2

## 2020-04-12 NOTE — Progress Notes (Signed)
Subjective: Patient resting on my entering the room but easily alerted.  Improved cognitively from yesterday.    Objective: Current vital signs: BP (!) 160/48 (BP Location: Right Arm)   Pulse 68   Temp 98.4 F (36.9 C) (Oral)   Resp 15   Ht 4' 4"  (1.321 m)   Wt 50 kg   SpO2 96%   BMI 28.66 kg/m  Vital signs in last 24 hours: Temp:  [97.6 F (36.4 C)-98.8 F (37.1 C)] 98.4 F (36.9 C) (12/22 0945) Pulse Rate:  [59-81] 68 (12/22 0945) Resp:  [13-28] 15 (12/22 0945) BP: (119-197)/(48-145) 160/48 (12/22 0945) SpO2:  [96 %-99 %] 96 % (12/22 0945)  Intake/Output from previous day: 12/21 0701 - 12/22 0700 In: 230 [P.O.:230] Out: 500  Intake/Output this shift: Total I/O In: 240 [P.O.:240] Out: -  Nutritional status:  Diet Order            Diet Carb Modified Fluid consistency: Thin; Room service appropriate? Yes with Assist; Fluid restriction: 1200 mL Fluid  Diet effective now                 Neurologic Exam: Mental Status: Alert, oriented to name, year and that she is in the hospital although she thinks it is the hospital in [facility].  Speech fluent without evidence of aphasia.  Able to follow commands.  Dysarthric. Cranial Nerves: II: Decreased peripheral vision bilaterally III,IV, VI: Extra-ocular motions intact bilaterally V,VII: decreased right NLF, facial light touch sensation normal bilaterally VIII: hearing normal bilaterally IX,X: gag reflex present XI: bilateral shoulder shrug XII: midline tongue extension Motor: 5/5 strength in BUE's.  LE amputations Sensory: Pinprick and light touch intact throughout, bilaterally   Lab Results: Basic Metabolic Panel: Recent Labs  Lab 04/10/20 1127 04/10/20 1129 04/11/20 0403 04/12/20 0553  NA 140  --  139 138  K 4.8  --  4.6 3.7  CL 97*  --  99 99  CO2 28  --  24 26  GLUCOSE 186*  --  121* 79  BUN 53*  --  57* 25*  CREATININE 9.33*  --  10.31* 6.32*  CALCIUM 8.9  --  8.7* 8.6*  MG  --  2.2  --  1.9   PHOS  --   --  4.2 3.3    Liver Function Tests: Recent Labs  Lab 04/10/20 1127 04/12/20 0553  AST 17 17  ALT 13 10  ALKPHOS 92 77  BILITOT 0.6 0.6  PROT 7.4 6.6  ALBUMIN 3.7 3.2*   No results for input(s): LIPASE, AMYLASE in the last 168 hours. Recent Labs  Lab 04/10/20 1129  AMMONIA 19    CBC: Recent Labs  Lab 04/10/20 1127 04/11/20 0403 04/12/20 0553  WBC 4.2 3.9* 4.8  NEUTROABS 3.0  --  3.5  HGB 13.4 12.1 11.3*  HCT 43.2 39.6 36.3  MCV 96.4 98.3 96.3  PLT 113* 93* 97*    Cardiac Enzymes: No results for input(s): CKTOTAL, CKMB, CKMBINDEX, TROPONINI in the last 168 hours.  Lipid Panel: No results for input(s): CHOL, TRIG, HDL, CHOLHDL, VLDL, LDLCALC in the last 168 hours.  CBG: Recent Labs  Lab 04/10/20 2046 04/11/20 0843 04/11/20 1829 04/11/20 2157 04/12/20 0739  GLUCAP 133* 120* 90 97 86    Microbiology: Results for orders placed or performed during the hospital encounter of 04/10/20  Resp Panel by RT-PCR (Flu A&B, Covid) Nasopharyngeal Swab     Status: None   Collection Time: 04/10/20 12:58 PM  Specimen: Nasopharyngeal Swab; Nasopharyngeal(NP) swabs in vial transport medium  Result Value Ref Range Status   SARS Coronavirus 2 by RT PCR NEGATIVE NEGATIVE Final    Comment: (NOTE) SARS-CoV-2 target nucleic acids are NOT DETECTED.  The SARS-CoV-2 RNA is generally detectable in upper respiratory specimens during the acute phase of infection. The lowest concentration of SARS-CoV-2 viral copies this assay can detect is 138 copies/mL. A negative result does not preclude SARS-Cov-2 infection and should not be used as the sole basis for treatment or other patient management decisions. A negative result may occur with  improper specimen collection/handling, submission of specimen other than nasopharyngeal swab, presence of viral mutation(s) within the areas targeted by this assay, and inadequate number of viral copies(<138 copies/mL). A negative  result must be combined with clinical observations, patient history, and epidemiological information. The expected result is Negative.  Fact Sheet for Patients:  EntrepreneurPulse.com.au  Fact Sheet for Healthcare Providers:  IncredibleEmployment.be  This test is no t yet approved or cleared by the [place] FDA and  has been authorized for detection and/or diagnosis of SARS-CoV-2 by FDA under an Emergency Use Authorization (EUA). This EUA will remain  in effect (meaning this test can be used) for the duration of the COVID-19 declaration under Section 564(b)(1) of the Act, 21 U.S.C.section 360bbb-3(b)(1), unless the authorization is terminated  or revoked sooner.       Influenza A by PCR NEGATIVE NEGATIVE Final   Influenza B by PCR NEGATIVE NEGATIVE Final    Comment: (NOTE) The Xpert Xpress SARS-CoV-2/FLU/RSV plus assay is intended as an aid in the diagnosis of influenza from Nasopharyngeal swab specimens and should not be used as a sole basis for treatment. Nasal washings and aspirates are unacceptable for Xpert Xpress SARS-CoV-2/FLU/RSV testing.  Fact Sheet for Patients: EntrepreneurPulse.com.au  Fact Sheet for Healthcare Providers: IncredibleEmployment.be  This test is not yet approved or cleared by the [place] FDA and has been authorized for detection and/or diagnosis of SARS-CoV-2 by FDA under an Emergency Use Authorization (EUA). This EUA will remain in effect (meaning this test can be used) for the duration of the COVID-19 declaration under Section 564(b)(1) of the Act, 21 U.S.C. section 360bbb-3(b)(1), unless the authorization is terminated or revoked.  Performed at [facility], [address] [place], [place] [address]   CULTURE, BLOOD (ROUTINE X 2) w Reflex to ID Panel     Status: None (Preliminary result)   Collection Time: 04/11/20  6:42 PM   Specimen: BLOOD  Result  Value Ref Range Status   Specimen Description BLOOD BLOOD LEFT HAND  Final   Special Requests   Final    BOTTLES DRAWN AEROBIC AND ANAEROBIC Blood Culture results may not be optimal due to an inadequate volume of blood received in culture bottles   Culture   Final    NO GROWTH < 12 HOURS Performed at [facility], [address], [place], [place] [address]    Report Status PENDING  Incomplete  CULTURE, BLOOD (ROUTINE X 2) w Reflex to ID Panel     Status: None (Preliminary result)   Collection Time: 04/11/20  6:50 PM   Specimen: BLOOD  Result Value Ref Range Status   Specimen Description BLOOD BLOOD LEFT FOREARM  Final   Special Requests   Final    BOTTLES DRAWN AEROBIC AND ANAEROBIC Blood Culture adequate volume   Culture   Final    NO GROWTH < 12 HOURS Performed at [facility], [address] [place], [place] [address]    Report Status PENDING  Incomplete    Coagulation Studies: No results for input(s): LABPROT, INR in the last 72 hours.  Imaging: DG Chest 2 View  Result Date: 04/10/2020 CLINICAL DATA:  Altered mental status EXAM: CHEST - 2 VIEW COMPARISON:  April 02, 2020 FINDINGS: Central catheter tip is in the right atrium, slightly distal to the cavoatrial junction. No pneumothorax. There is a small area of ill-defined opacity in the lateral right base. Lungs elsewhere are clear. There is cardiomegaly with pulmonary vascular normal. Patient is status post coronary artery bypass grafting. There is aortic atherosclerosis. No bone lesions. IMPRESSION: Central catheter as described without pneumothorax. Small area of suspected pneumonia lateral right base. Lungs elsewhere clear. There is cardiomegaly with postoperative coronary artery bypass grafting change. Aortic Atherosclerosis (ICD10-I70.0). Electronically Signed   By: Lowella Grip III M.D.   On: 04/10/2020 12:19   CT Head Wo Contrast  Result Date: 04/10/2020 CLINICAL DATA:  Altered mental status  EXAM: CT HEAD WITHOUT CONTRAST TECHNIQUE: Contiguous axial images were obtained from the base of the skull through the vertex without intravenous contrast. COMPARISON:  05/01/2019 FINDINGS: Brain: No evidence of acute infarction, hemorrhage, hydrocephalus, extra-axial collection or mass lesion/mass effect. Scattered low-density changes within the periventricular and subcortical white matter compatible with chronic microvascular ischemic change. Mild diffuse cerebral volume loss. Vascular: Atherosclerotic calcifications involving the large vessels of the skull base. No unexpected hyperdense vessel. Skull: Normal. Negative for fracture or focal lesion. Sinuses/Orbits: Chronic mucosal thickening within the left sphenoid sinus and posterior left ethmoid air cells. Remaining paranasal sinuses and mastoid air cells are clear. Other: None. IMPRESSION: 1. No acute intracranial findings. 2. Chronic microvascular ischemic change and cerebral volume loss. Electronically Signed   By: Davina Poke D.O.   On: 04/10/2020 12:29   CT CHEST WO CONTRAST  Result Date: 04/10/2020 CLINICAL DATA:  Altered mental status EXAM: CT CHEST WITHOUT CONTRAST TECHNIQUE: Multidetector CT imaging of the chest was performed following the standard protocol without IV contrast. COMPARISON:  Chest radiograph April 10, 2020; chest CT Sep 02, 2019 FINDINGS: Note that there is a degree of motion artifact. Cardiovascular: There is no thoracic aortic aneurysm. There are scattered foci of calcification in visualized great vessels. There is aortic atherosclerosis as well as multiple foci of coronary artery calcification. There is cardiomegaly. No pericardial effusion or pericardial thickening. Central catheter tip is in the right atrium, slightly beyond the cavoatrial junction. Mediastinum/Nodes: Thyroid appears unremarkable. No appreciable thoracic adenopathy. No esophageal lesions are evident. Lungs/Pleura: There is atelectatic change in the lung  bases. There is suspected loculated effusion along the lateral aspect of the right major fissure. No frank airspace consolidation. No appreciable pleural effusions. No pneumothorax. Trachea and major bronchial structures appear patent, although there is a degree of motion artifact. Upper Abdomen: There is upper abdominal aortic as well as multiple mesenteric arterial vascular calcifications. Visualized upper abdominal structures otherwise appear unremarkable. Musculoskeletal: Status post median sternotomy. No blastic or lytic bone lesions evident. No evident chest wall lesions. IMPRESSION: 1. Study somewhat less than optimal due to a degree of motion artifact. 2. Areas of atelectatic change. Mild loculated effusion along the lateral aspect of the right major fissure. No edema or consolidation. 3.  No evident adenopathy. 4. Aortic atherosclerosis as well as foci of great vessel and coronary artery calcification. There is a degree of cardiomegaly. Central catheter tip in right atrium. Aortic Atherosclerosis (ICD10-I70.0). Electronically Signed  By: Lowella Grip III M.D.   On: 04/10/2020 14:59   MR BRAIN WO CONTRAST  Result Date: 04/10/2020 CLINICAL DATA:  Mental status change EXAM: MRI HEAD WITHOUT CONTRAST TECHNIQUE: Multiplanar, multiecho pulse sequences of the brain and surrounding structures were obtained without intravenous contrast. COMPARISON:  March 2020 FINDINGS: Motion artifact is present. Brain: There is no acute infarction or intracranial hemorrhage. There is no intracranial mass, mass effect, or edema. There is no hydrocephalus or extra-axial fluid collection. Prominence of the ventricles and sulci reflects generalized parenchymal volume loss. Patchy and confluent areas of T2 hyperintensity in the supratentorial white matter are nonspecific but probably reflect moderate chronic microvascular ischemic changes. 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 Small chronic infarcts  of the cerebellum. There are a few scattered small foci of susceptibility hypointensity in the cerebral white matter likely reflecting chronic microhemorrhages. Vascular: Major vessel flow voids at the skull base are preserved. Skull and upper cervical spine: Normal marrow signal is preserved. Sinuses/Orbits: Patchy paranasal sinus mucosal thickening, including chronic left sphenoid mucosal thickening. Bilateral lens replacements. Other: Sella is unremarkable.  Patchy mastoid fluid opacification. IMPRESSION: No acute infarction, hemorrhage, or mass. Chronic microvascular ischemic changes and few small chronic infarcts. No substantial change since 2020. Electronically Signed   By: Macy Mis M.D.   On: 04/10/2020 19:56    Medications:  I have reviewed the patient's current medications. Scheduled: . apixaban  2.5 mg Oral BID  . aspirin EC  81 mg Oral Daily  . atorvastatin  80 mg Oral QHS  . brimonidine  1 drop Both Eyes TID  . carvedilol  37.5 mg Oral BID WC  . Chlorhexidine Gluconate Cloth  6 each Topical Q0600  . doxycycline  100 mg Oral Q12H  . fluticasone  2 spray Each Nare Daily  . hydrALAZINE  50 mg Oral Q8H  . insulin aspart  0-6 Units Subcutaneous TID WC  . ipratropium  2 spray Each Nare BID  . isosorbide mononitrate  30 mg Oral Daily  . levETIRAcetam  250 mg Oral Q M,W,F-HD  . levETIRAcetam  500 mg Oral BID  . mirtazapine  7.5 mg Oral QHS  . omega-3 acid ethyl esters  2 g Oral Daily  . pantoprazole  40 mg Oral Daily  . rOPINIRole  0.25 mg Oral QHS  . sevelamer carbonate  1,600 mg Oral TID WC  . sodium chloride flush  3 mL Intravenous Q12H  . sodium chloride flush  3 mL Intravenous Q12H  . [START ON 04/20/2020] thiamine injection  100 mg Intravenous Daily  . timolol  1 drop Both Eyes TID  . triamcinolone   Topical BID  . valACYclovir  500 mg Oral Daily  . vitamin B-12  1,000 mcg Oral Daily    Assessment/Plan: 69 y.o. 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 Patient was recently discharged from the hospital on 04/06/20.During that hospitalization she had a brief PEA arrest aftershe received hydromorphone and recovered immediately following administration of Narcan. Patient also treated for chest wall shingles outbreak during that admission.   Her daughter states that since her discharge she has not been herusual self and has been acting very erratic. Initial blood work was unremarkable including serum ammonia.  Patient without an elevated white blood  cell count and has had no fever.  On Valacyclovir for shingles.  Despite recent history of shingles current presentation not particularly suggestive of a herpes encephalitis.  Patient also on Eliquis which would make LP prohibitive for at least 48 hours after discontinuation.  After chart review, unclear why patient is on Eliquis.  MRI of the brain personally reviewed and shows no evidence of temporal abnormalities to suggest herpes encephalitis or other acute changes to suggest stroke or anoxic brain injury.    Patient improved today.  On high dose thiamine. TSH elevated.  B1 pending.  EEG significant for general background slowing.  No evidence of epileptiform activity.    Mental status changes likely multifactorial.    Recommendations: 1. Keppra level and B1 pending 2. Agree with continued thiamine   3. No indication for LP at this time.    LOS: 2 days   Alexis Goodell, MD Neurology  04/12/2020  10:36 AM

## 2020-04-12 NOTE — Consult Note (Signed)
Pulmonary Medicine          Date: 04/12/2020,   MRN# [id number] Stephanie Ellis [birth year]     AdmissionWeight: 50 kg                 CurrentWeight: 50 kg   Referring physician Dr. Dwyane Dee   CHIEF COMPLAINT:   Loculated pleural effusion   HISTORY OF PRESENT ILLNESS    Stephanie Ellis is a 77F with PMH significant for diabetes mellitus with complications of ESRD on HD (M/W/F) , hypertension, PAD s/p bilateral BKA, coronary artery disease status post CABG who was brought to the emergency room by EMS for evaluation of mental status changes.  Patient was recently discharged from the hospital on 04/06/20.  During that hospitalization she had a brief PEA arrest after she received hydromorphone and recovered immediately following administration of Narcan.  Her daughter states that since her discharge she has not been her usual self and has been acting very erratic.  At baseline patient is able to take care of herself and thyroid activities of daily living such as transfers, grooming and feeding herself.  Since her discharge she has been very weak and has been unable to feed herself or transfer to her wheelchair.  Family is also concerned that her speech is slurred.  Patient refused to go to dialysis on the morning of her admission and so EMS was called.  CT scan of the head without contrast shows no acute intracranial findings.Chronic microvascular ischemic change and cerebral volume loss.Chest x ray reviewed by me shows central catheter as described without pneumothorax. Small area of suspected pneumonia lateral right base. Lungs elsewhere clear. There is cardiomegaly with postoperative coronary artery bypass grafting change. Aortic Atherosclerosis . Pulmonary consultation placed for possible empyma vs parapneumonic effusion      PAST MEDICAL HISTORY   Past Medical History:  Diagnosis Date  . (HFpEF) heart failure with preserved ejection fraction ([place])    a. 01/2018 Echo: EF 50-55%,  no rwma, Gr1 DD, triv AI, mild MR. Midly dil LA/RV, mod reduced RV fxn. Irreg thickening of TV w/ mobile echodensity that appears to arise from valve.  . Anemia   . Anorexia   . Bacteremia due to Pseudomonas   . Carotid arterial disease ([place])    a. 01/2017 Carotid U/S: RICA <78, LICA <67.  Marland Kitchen CHF (congestive heart failure) ([place])   . Complication of anesthesia    a. Pt reports h/o complication on 9 different occasions - ? hypotension/arrest.  . 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 01/2015 Cath [facility]): 70% LM, p-mLAD 50-60% (Resting FFR 0.75), mRCA 80-90%, ~40 Ost OM & D1-->CABG; b. 01/2016 Staged PCI of LCX x 2 and RCA.  Marland Kitchen ESRD (end stage renal disease) on dialysis ([place])    a. ESRD secondary to acute kidney failure s/p CABG-->PD  . Essential hypertension   . GERD (gastroesophageal reflux disease)   . Heart murmur   . Hypercholesterolemia   . Myocardial infarction ([place]) 2016  . PVD (peripheral vascular disease) ([place])    a. 01/30/2018 PV Angio: Sev Left Popliteal dzs s/p PTA w/ 76m lutonix DEB w/ mech aspiration of the L popliteal, L AT, and Left tibioperoneal trunck and peroneal artery.  . 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  [facility]: Dr. BMarland KitchenHaithcock: CABG x 3, LIMA to LAD, SVG to RCA, SVG to OM3, EVH  . Sinusitis 2019  . Type II diabetes mellitus with  complication ([place])    CAD     SURGICAL HISTORY   Past Surgical History:  Procedure Laterality Date  . ABDOMINAL HYSTERECTOMY    . AMPUTATION Right 02/25/2018   Procedure: AMPUTATION BELOW KNEE;  Surgeon: Katha Cabal, MD;  Location: [facility];  Service: Vascular;  Laterality: Right;  . AMPUTATION Left 03/11/2018   Procedure: AMPUTATION BELOW KNEE;  Surgeon: Algernon Huxley, MD;  Location: [facility];  Service: Vascular;  Laterality: Left;  . AMPUTATION Bilateral 04/13/2018   Procedure: REVISION OF BILATERAL AMPUTATIONS BELOW KNEE WITH WOUND VAC APPLICATIONS;  Surgeon: Evaristo Bury, MD;  Location:  [facility];  Service: Vascular;  Laterality: Bilateral;  . AMPUTATION TOE Left 02/06/2018   Procedure: AMPUTATION GREAT TOE;  Surgeon: Samara Deist, DPM;  Location: [facility];  Service: Podiatry;  Laterality: Left;  . APPLICATION OF WOUND VAC Left 04/13/2018   Procedure: APPLICATION OF WOUND VAC;  Surgeon: Evaristo Bury, MD;  Location: [facility];  Service: Vascular;  Laterality: Left;  . ARTERIOVENOUS GRAFT PLACEMENT  05/2016  . AV FISTULA PLACEMENT Left 05/30/2016   Procedure: ARTERIOVENOUS graft;  Surgeon: Algernon Huxley, MD;  Location: [facility];  Service: Vascular;  Laterality: Left;  . CAPD INSERTION N/A 10/30/2017   Procedure: LAPAROSCOPIC INSERTION CONTINUOUS AMBULATORY PERITONEAL DIALYSIS  (CAPD) CATHETER;  Surgeon: Algernon Huxley, MD;  Location: [facility];  Service: Vascular;  Laterality: N/A;  . CARDIAC CATHETERIZATION  01/2015   [facility]: Ost LM 70%, p-m LAD 50-60% (Rest FFR + @ 0.75), mRCA 80-90%, ostD1 40%, pOM1 40%  . CATARACT EXTRACTION W/PHACO Left 01/18/2016   Procedure: CATARACT EXTRACTION PHACO AND INTRAOCULAR LENS PLACEMENT (IOC);  Surgeon: Eulogio Bear, MD;  Location: [facility];  Service: Ophthalmology;  Laterality: Left;  [place] 1.05 AP% 15.5 CDE 10.16 Fluid Pack Lot # [id number]  . CATARACT EXTRACTION W/PHACO Right 08/01/2016   Procedure: CATARACT EXTRACTION PHACO AND INTRAOCULAR LENS PLACEMENT (IOC);  Surgeon: Eulogio Bear, MD;  Location: [facility];  Service: Ophthalmology;  Laterality: Right;  [place] 01:00.6 AP% 11.4 CDE 6.93  LOT # [id number]  . COLONOSCOPY    . CORONARY ANGIOPLASTY     SENTS 02/12/16  . CORONARY ARTERY BYPASS GRAFT  03/28/15    [facility]: Dr. Waldemar Dickens: LIMA to LAD, SVG to RCA, SVG to OM3, EVH  . DIALYSIS/PERMA CATHETER INSERTION N/A 02/11/2018   Procedure: DIALYSIS/PERMA CATHETER INSERTION;  Surgeon: Algernon Huxley, MD;  Location: [place] [facility];  Service: Cardiovascular;  Laterality: N/A;  . DIALYSIS/PERMA CATHETER REMOVAL Right 02/04/2018   Procedure:  DIALYSIS/PERMA CATHETER REMOVAL;  Surgeon: Katha Cabal, MD;  Location: [place] [facility];  Service: Cardiovascular;  Laterality: Right;  . ESOPHAGOGASTRODUODENOSCOPY (EGD) WITH PROPOFOL N/A 10/24/2016   Procedure: ESOPHAGOGASTRODUODENOSCOPY (EGD) WITH PROPOFOL;  Surgeon: [name], MD;  Location: [facility];  Service: Endoscopy;  Laterality: N/A;  . EYE SURGERY Bilateral    cataract surgery  . EYE SURGERY     drains for glaucoma  . INSERTION EXPRESS TUBE SHUNT Right 08/01/2016   Procedure: INSERTION EXPRESS TUBE SHUNT;  Surgeon: Eulogio Bear, MD;  Location: [facility];  Service: Ophthalmology;  Laterality: Right;  . INSERTION OF AHMED VALVE Left 08/15/2016   Procedure: INSERTION OF AHMED VALVE;  Surgeon: Eulogio Bear, MD;  Location: [facility];  Service: Ophthalmology;  Laterality: Left;  . INSERTION OF DIALYSIS CATHETER    . LOWER EXTREMITY ANGIOGRAPHY Right 02/19/2018   Procedure: Lower Extremity Angiography;  Surgeon: Leotis Pain  S, MD;  Location: [place] [facility];  Service: Cardiovascular;  Laterality: Right;  . LOWER EXTREMITY ANGIOGRAPHY Left 01/30/2018   Procedure: Lower Extremity Angiography;  Surgeon: Katha Cabal, MD;  Location: [place] [facility];  Service: Cardiovascular;  Laterality: Left;  . REMOVAL OF A DIALYSIS CATHETER N/A 02/25/2018   Procedure: REMOVAL OF A DIALYSIS CATHETER;  Surgeon: Katha Cabal, MD;  Location: [facility];  Service: Vascular;  Laterality: N/A;  . TEMPORARY DIALYSIS CATHETER N/A 02/06/2018   Procedure: TEMPORARY DIALYSIS CATHETER;  Surgeon: Katha Cabal, MD;  Location: [place] [facility];  Service: Cardiovascular;  Laterality: N/A;  . TRANSTHORACIC ECHOCARDIOGRAM  January 2017    EF 60-65%. GR 2 DD. Mild degenerative mitral valve disease but no prolapse or regurgitation. Mild left atrial dilation. Mild to moderate LVH. Pericardial effusion gone  . UPPER EXTREMITY ANGIOGRAPHY Left 06/27/2016   Procedure: Upper  Extremity Angiography;  Surgeon: Algernon Huxley, MD;  Location: [place] [facility];  Service: Cardiovascular;  Laterality: Left;  . UPPER EXTREMITY ANGIOGRAPHY Left 08/22/2016   Procedure: Upper Extremity Angiography;  Surgeon: Algernon Huxley, MD;  Location: [place] [facility];  Service: Cardiovascular;  Laterality: Left;  Marland Kitchen VASCULAR SURGERY    . WOUND DEBRIDEMENT Left 05/08/2018   Procedure: DEBRIDEMENT OF LEFT LEG BKA;  Surgeon: Algernon Huxley, MD;  Location: [facility];  Service: General;  Laterality: Left;     FAMILY HISTORY   Family History  Problem Relation Age of Onset  . Diabetes Mellitus II Mother   . Heart failure Mother   . Pancreatic cancer Father      SOCIAL HISTORY   Social History   Tobacco Use  . Smoking status: Never Smoker  . Smokeless tobacco: Never Used  Vaping Use  . Vaping Use: Never used  Substance Use Topics  . Alcohol use: No  . Drug use: No     MEDICATIONS    Home Medication:    Current Medication:  Current Facility-Administered Medications:  .  0.9 %  sodium chloride infusion, 250 mL, Intravenous, PRN, Agbata, Tochukwu, MD .  apixaban (ELIQUIS) tablet 2.5 mg, 2.5 mg, Oral, BID, Benita Gutter, RPH, 2.5 mg at 04/12/20 0855 .  aspirin EC tablet 81 mg, 81 mg, Oral, Daily, Benita Gutter, RPH, 81 mg at 04/12/20 0854 .  atorvastatin (LIPITOR) tablet 80 mg, 80 mg, Oral, QHS, Agbata, Tochukwu, MD, 80 mg at 04/11/20 2230 .  brimonidine (ALPHAGAN) 0.2 % ophthalmic solution 1 drop, 1 drop, Both Eyes, TID, Benita Gutter, RPH, 1 drop at 04/12/20 0859 .  carvedilol (COREG) tablet 37.5 mg, 37.5 mg, Oral, BID WC, Dorothe Pea, RPH, 37.5 mg at 04/12/20 0854 .  cefTRIAXone (ROCEPHIN) 2 g in sodium chloride 0.9 % 100 mL IVPB, 2 g, Intravenous, Q24H, Elodia Florence., MD, Last Rate: 200 mL/hr at 04/11/20 2230, 2 g at 04/11/20 2230 .  Chlorhexidine Gluconate Cloth 2 % PADS 6 each, 6 each, Topical, Q0600, Murlean Iba, MD, 6 each at 04/11/20 0902 .   doxycycline (VIBRA-TABS) tablet 100 mg, 100 mg, Oral, Q12H, Elodia Florence., MD, 100 mg at 04/12/20 0855 .  fluticasone (FLONASE) 50 MCG/ACT nasal spray 2 spray, 2 spray, Each Nare, Daily, Benita Gutter, RPH, 2 spray at 04/12/20 0859 .  hydrALAZINE (APRESOLINE) injection 5 mg, 5 mg, Intravenous, Q6H PRN, Elodia Florence., MD .  hydrALAZINE (APRESOLINE) tablet 50 mg, 50 mg, Oral, Q8H, Agbata, Tochukwu,  MD, 50 mg at 04/12/20 1428 .  insulin aspart (novoLOG) injection 0-6 Units, 0-6 Units, Subcutaneous, TID WC, Agbata, Tochukwu, MD .  ipratropium (ATROVENT) 0.06 % nasal spray 2 spray, 2 spray, Each Nare, BID, Benita Gutter, RPH, 2 spray at 04/12/20 0901 .  [COMPLETED] isosorbide mononitrate (IMDUR) 24 hr tablet 30 mg, 30 mg, Oral, Once, 30 mg at 04/12/20 1433 **FOLLOWED BY** [START ON 04/13/2020] isosorbide mononitrate (IMDUR) 24 hr tablet 60 mg, 60 mg, Oral, Daily, Shawna Clamp, MD .  levETIRAcetam (KEPPRA) tablet 250 mg, 250 mg, Oral, Q M,W,F-HD, Benita Gutter, RPH .  levETIRAcetam (KEPPRA) tablet 500 mg, 500 mg, Oral, BID, Benita Gutter, RPH, 500 mg at 04/12/20 0856 .  meclizine (ANTIVERT) tablet 25 mg, 25 mg, Oral, Q4H PRN, Agbata, Tochukwu, MD .  mirtazapine (REMERON) tablet 7.5 mg, 7.5 mg, Oral, QHS, Agbata, Tochukwu, MD, 7.5 mg at 04/11/20 2230 .  omega-3 acid ethyl esters (LOVAZA) capsule 2 g, 2 g, Oral, Daily, Benita Gutter, RPH, 2 g at 04/12/20 0856 .  ondansetron (ZOFRAN) tablet 4 mg, 4 mg, Oral, Q6H PRN **OR** ondansetron (ZOFRAN) injection 4 mg, 4 mg, Intravenous, Q6H PRN, Agbata, Tochukwu, MD .  pantoprazole (PROTONIX) EC tablet 40 mg, 40 mg, Oral, Daily, Agbata, Tochukwu, MD, 40 mg at 04/12/20 0855 .  polyvinyl alcohol (LIQUIFILM TEARS) 1.4 % ophthalmic solution 1 drop, 1 drop, Both Eyes, PRN, Benita Gutter, RPH, 1 drop at 04/10/20 2143 .  rOPINIRole (REQUIP) tablet 0.25 mg, 0.25 mg, Oral, QHS, Agbata, Tochukwu, MD, 0.25 mg at 04/11/20 2230 .  sevelamer  carbonate (RENVELA) tablet 1,600 mg, 1,600 mg, Oral, TID WC, Benita Gutter, RPH, 1,600 mg at 04/12/20 1216 .  sodium chloride flush (NS) 0.9 % injection 3 mL, 3 mL, Intravenous, Q12H, Agbata, Tochukwu, MD, 3 mL at 04/11/20 2232 .  sodium chloride flush (NS) 0.9 % injection 3 mL, 3 mL, Intravenous, Q12H, Agbata, Tochukwu, MD, 3 mL at 04/11/20 2233 .  sodium chloride flush (NS) 0.9 % injection 3 mL, 3 mL, Intravenous, PRN, Agbata, Tochukwu, MD .  thiamine 537m in normal saline (554m IVPB, 500 mg, Intravenous, Q8H, Last Rate: 100 mL/hr at 04/12/20 1431, 500 mg at 04/12/20 1431 **FOLLOWED BY** [START ON 04/15/2020] thiamine (B-1) 250 mg in sodium chloride 0.9 % 50 mL IVPB, 250 mg, Intravenous, Daily **FOLLOWED BY** [START ON 04/20/2020] thiamine (B-1) injection 100 mg, 100 mg, Intravenous, Daily, PoFlorene GlenA CaClint Lipps MD .  timolol (TIMOPTIC) 0.5 % ophthalmic solution 1 drop, 1 drop, Both Eyes, TID, ChBenita GutterRPH, 1 drop at 04/12/20 0900 .  triamcinolone (KENALOG) 0.1 % cream, , Topical, BID, ChBenita GutterRPSt. Alexius Hospital - [facility] CampusGiven at 04/12/20 0900 .  valACYclovir (VALTREX) tablet 500 mg, 500 mg, Oral, Daily, ChBenita GutterRPH, 500 mg at 04/11/20 1620 .  vitamin B-12 (CYANOCOBALAMIN) tablet 1,000 mcg, 1,000 mcg, Oral, Daily, ChBenita GutterRPH, 1,000 mcg at 04/12/20 [id number]  ALLERGIES   Chlorthalidone, Dilaudid [hydromorphone hcl], Fentanyl, Midazolam, Morphine and related, Ace inhibitors, Angiotensin receptor blockers, Metoclopramide, Norvasc [amlodipine], and Phenergan [promethazine hcl]     REVIEW OF SYSTEMS    Review of Systems:  Gen:  Denies  fever, sweats, chills weigh loss  HEENT: Denies blurred vision, double vision, ear pain, eye pain, hearing loss, nose bleeds, sore throat Cardiac:  No dizziness, chest pain or heaviness, chest tightness,edema Resp:   Denies cough or sputum porduction, shortness of breath,wheezing, hemoptysis,  Gi: Denies swallowing difficulty, stomach  pain, nausea or vomiting, diarrhea, constipation, bowel incontinence Gu:  Denies bladder incontinence, burning urine Ext:   Denies Joint pain, stiffness or swelling Skin: Denies  skin rash, easy bruising or bleeding or hives Endoc:  Denies polyuria, polydipsia , polyphagia or weight change Psych:   Denies depression, insomnia or hallucinations   Other:  All other systems negative   VS: BP (!) 182/67 (BP Location: Right Arm)   Pulse 66   Temp 98 F (36.7 C) (Oral)   Resp 18   Ht 4' 4"  (1.321 m)   Wt 50 kg   SpO2 97%   BMI 28.66 kg/m      PHYSICAL EXAM    GENERAL:NAD, no fevers, chills, no weakness no fatigue HEAD: Normocephalic, atraumatic.  EYES: Pupils equal, round, reactive to light. Extraocular muscles intact. No scleral icterus.  MOUTH: Moist mucosal membrane. Dentition intact. No abscess noted.  EAR, NOSE, THROAT: Clear without exudates. No external lesions.  NECK: Supple. No thyromegaly. No nodules. No JVD.  PULMONARY: Diffuse coarse rhonchi right sided +wheezes CARDIOVASCULAR: S1 and S2. Regular rate and rhythm. No murmurs, rubs, or gallops. No edema. Pedal pulses 2+ bilaterally.  GASTROINTESTINAL: Soft, nontender, nondistended. No masses. Positive bowel sounds. No hepatosplenomegaly.  MUSCULOSKELETAL: No swelling, clubbing, or edema. Range of motion full in all extremities.  NEUROLOGIC: Cranial nerves II through XII are intact. No gross focal neurological deficits. Sensation intact. Reflexes intact.  SKIN: No ulceration, lesions, rashes, or cyanosis. Skin warm and dry. Turgor intact.  PSYCHIATRIC: Mood, affect within normal limits. The patient is awake, alert and oriented x 3. Insight, judgment intact.       IMAGING    EEG  Result Date: 04/12/2020 Lora Havens, MD     04/12/2020 11:37 AM Patient Name: Ellan Tess MRN: [id number] Epilepsy Attending: Lora Havens Referring Physician/Provider: Dr. Fayrene Helper. Date: 04/11/2020 Duration: 21.55  minutes Patient history: 69 year old female with history of epilepsy who presented with altered mental status.  EEG evaluate for seizures. Level of alertness: Awake, asleep AEDs during EEG study: Keppra. Technical aspects: This EEG study was done with scalp electrodes positioned according to the 10-20 International system of electrode placement. Electrical activity was acquired at a sampling rate of 500Hz  and reviewed with a high frequency filter of 70Hz  and a low frequency filter of 1Hz . EEG data were recorded continuously and digitally stored. Description: During awake state, no clear posterior dominant rhythm was seen.  Sleep was characterized by vertex waves, sleep spindles (12 to 14 Hz), maximal frontocentral region.  EEG showed continuous generalized 3 to 5 Hz theta-delta slowing.  Intermittent rhythmic generalized delta activity was also noted.  Sharp transients were seen in bilateral posterior quadrant.  Hyperventilation and photic stimulation were not performed.   ABNORMALITY -Continuous slow, generalized -Intermittent rhythmic delta activity, generalized IMPRESSION: This study is suggestive of moderate diffuse encephalopathy, nonspecific etiology.  No seizures or definite epileptiform discharges were seen throughout the recording. Priyanka Barbra Sarks   CT ABDOMEN PELVIS WO CONTRAST  Result Date: 04/02/2020 CLINICAL DATA:  Left upper quadrant abdominal pain EXAM: CT ABDOMEN AND PELVIS WITHOUT CONTRAST TECHNIQUE: Multidetector CT imaging of the abdomen and pelvis was performed following the standard protocol without IV contrast. COMPARISON:  CT 05/01/2019 FINDINGS: Lower chest: Atelectatic changes are present in the lung bases including more bandlike areas of subsegmental atelectasis. Some interlobular septal thickening is noted as well which could reflect early edema. Cardiomegaly appearance. Tip of a dual lumen dialysis catheter is noted. Post sternotomy  changes. Coronary artery calcifications.  Hepatobiliary: No visible liver lesion on this unenhanced CT. Liver attenuation is within normal limits. Scattered calcifications in the liver are favored to be vascular in nature. Gallbladder wall appears thickened with some pericholecystic fluid though may be redistributed. Layering calcified gallstones and probable sludge present within the lumen. Pancreas: No pancreatic ductal dilatation or surrounding inflammatory changes. Spleen: Normal in size. No concerning splenic lesions. Linear branching calcifications are present. Adrenals/Urinary Tract: Normal adrenal glands. Kidneys are not symmetric in size and normally located. Extensive vascular calcifications in the renal hila. Few subcentimeter hypoattenuating foci too small to fully characterize on CT imaging but statistically likely benign. No visible or contour deforming renal lesions. No urolithiasis or hydronephrosis is evident. Urinary bladder is largely decompressed at the time of exam and therefore poorly evaluated by CT imaging. Circumferential bladder wall thickening is nonspecific given underdistention. Stomach/Bowel: Distal esophagus, stomach and duodenum are unremarkable. No conspicuous small bowel thickening or dilatation. Portion of small bowel protrudes into a small infraumbilical ventral hernia anteriorly albeit without convincing evidence of mechanical obstruction or vascular compromise at this time. There is some diffuse mild pancolonic mural thickening. Scattered colonic diverticula without focal inflammation to suggest diverticulitis. Vascular/Lymphatic: Extensive atherosclerotic calcification throughout the abdominal aorta and branch vessels. No aneurysm or ectasia. Limited evaluation the absence of contrast. No suspicious or enlarged lymph nodes in the included lymphatic chains. Reproductive: Uterus is surgically absent. No concerning adnexal lesions. Other: Small volume simple attenuation ascites. No free air. Diffuse body wall edema. Fat  and bowel containing infraumbilical ventral hernia as above. No other bowel containing hernia. No retroperitoneal or body wall hematoma. Musculoskeletal: Remote fracture deformity of the left inferior pubic ramus. No acute osseous abnormality or suspicious osseous lesion. Musculature is unremarkable. IMPRESSION: 1. Cholelithiasis with gallbladder wall thickening and pericholecystic fluid though may be redistributed. If there is clinical concern for acute cholecystitis, consider further evaluation with right upper quadrant ultrasound. 2. Mild diffuse pancolonic mural thickening, nonspecific, but can be seen with colitis or possibly related to the abdominal ascites. 3. Fat and small bowel containing infraumbilical ventral hernia anteriorly albeit without convincing evidence of mechanical obstruction or vascular compromise at this time. 4. Circumferential bladder wall thickening is nonspecific given underdistention. Recommend correlation with urinalysis to exclude cystitis. 5. Small volume simple attenuation ascites. Diffuse body wall edema. Correlate for features of anasarca. 6. Cardiomegaly with features of edema and basilar atelectasis. 7. Aortic Atherosclerosis (ICD10-I70.0). Electronically Signed   By: Lovena Le M.D.   On: 04/02/2020 15:19   DG Chest 2 View  Result Date: 04/10/2020 CLINICAL DATA:  Altered mental status EXAM: CHEST - 2 VIEW COMPARISON:  April 02, 2020 FINDINGS: Central catheter tip is in the right atrium, slightly distal to the cavoatrial junction. No pneumothorax. There is a small area of ill-defined opacity in the lateral right base. Lungs elsewhere are clear. There is cardiomegaly with pulmonary vascular normal. Patient is status post coronary artery bypass grafting. There is aortic atherosclerosis. No bone lesions. IMPRESSION: Central catheter as described without pneumothorax. Small area of suspected pneumonia lateral right base. Lungs elsewhere clear. There is cardiomegaly with  postoperative coronary artery bypass grafting change. Aortic Atherosclerosis (ICD10-I70.0). Electronically Signed   By: Lowella Grip III M.D.   On: 04/10/2020 12:19   CT Head Wo Contrast  Result Date: 04/10/2020 CLINICAL DATA:  Altered mental status EXAM: CT HEAD WITHOUT CONTRAST TECHNIQUE: Contiguous axial images were obtained from the base of the skull through the vertex  without intravenous contrast. COMPARISON:  05/01/2019 FINDINGS: Brain: No evidence of acute infarction, hemorrhage, hydrocephalus, extra-axial collection or mass lesion/mass effect. Scattered low-density changes within the periventricular and subcortical white matter compatible with chronic microvascular ischemic change. Mild diffuse cerebral volume loss. Vascular: Atherosclerotic calcifications involving the large vessels of the skull base. No unexpected hyperdense vessel. Skull: Normal. Negative for fracture or focal lesion. Sinuses/Orbits: Chronic mucosal thickening within the left sphenoid sinus and posterior left ethmoid air cells. Remaining paranasal sinuses and mastoid air cells are clear. Other: None. IMPRESSION: 1. No acute intracranial findings. 2. Chronic microvascular ischemic change and cerebral volume loss. Electronically Signed   By: Davina Poke D.O.   On: 04/10/2020 12:29   CT CHEST WO CONTRAST  Result Date: 04/10/2020 CLINICAL DATA:  Altered mental status EXAM: CT CHEST WITHOUT CONTRAST TECHNIQUE: Multidetector CT imaging of the chest was performed following the standard protocol without IV contrast. COMPARISON:  Chest radiograph April 10, 2020; chest CT Sep 02, 2019 FINDINGS: Note that there is a degree of motion artifact. Cardiovascular: There is no thoracic aortic aneurysm. There are scattered foci of calcification in visualized great vessels. There is aortic atherosclerosis as well as multiple foci of coronary artery calcification. There is cardiomegaly. No pericardial effusion or pericardial thickening.  Central catheter tip is in the right atrium, slightly beyond the cavoatrial junction. Mediastinum/Nodes: Thyroid appears unremarkable. No appreciable thoracic adenopathy. No esophageal lesions are evident. Lungs/Pleura: There is atelectatic change in the lung bases. There is suspected loculated effusion along the lateral aspect of the right major fissure. No frank airspace consolidation. No appreciable pleural effusions. No pneumothorax. Trachea and major bronchial structures appear patent, although there is a degree of motion artifact. Upper Abdomen: There is upper abdominal aortic as well as multiple mesenteric arterial vascular calcifications. Visualized upper abdominal structures otherwise appear unremarkable. Musculoskeletal: Status post median sternotomy. No blastic or lytic bone lesions evident. No evident chest wall lesions. IMPRESSION: 1. Study somewhat less than optimal due to a degree of motion artifact. 2. Areas of atelectatic change. Mild loculated effusion along the lateral aspect of the right major fissure. No edema or consolidation. 3.  No evident adenopathy. 4. Aortic atherosclerosis as well as foci of great vessel and coronary artery calcification. There is a degree of cardiomegaly. Central catheter tip in right atrium. Aortic Atherosclerosis (ICD10-I70.0). Electronically Signed   By: Lowella Grip III M.D.   On: 04/10/2020 14:59   MR BRAIN WO CONTRAST  Result Date: 04/10/2020 CLINICAL DATA:  Mental status change EXAM: MRI HEAD WITHOUT CONTRAST TECHNIQUE: Multiplanar, multiecho pulse sequences of the brain and surrounding structures were obtained without intravenous contrast. COMPARISON:  March 2020 FINDINGS: Motion artifact is present. Brain: There is no acute infarction or intracranial hemorrhage. There is no intracranial mass, mass effect, or edema. There is no hydrocephalus or extra-axial fluid collection. Prominence of the ventricles and sulci reflects generalized parenchymal volume  loss. Patchy and confluent areas of T2 hyperintensity in the supratentorial white matter are nonspecific but probably reflect moderate chronic microvascular ischemic changes. 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 Small chronic infarcts of the cerebellum. There are a few scattered small foci of susceptibility hypointensity in the cerebral white matter likely reflecting chronic microhemorrhages. Vascular: Major vessel flow voids at the skull base are preserved. Skull and upper cervical spine: Normal marrow signal is preserved. Sinuses/Orbits: Patchy paranasal sinus mucosal thickening, including chronic left sphenoid mucosal thickening. Bilateral lens replacements. Other: Sella is unremarkable.  Patchy  mastoid fluid opacification. IMPRESSION: No acute infarction, hemorrhage, or mass. Chronic microvascular ischemic changes and few small chronic infarcts. No substantial change since 2020. Electronically Signed   By: Macy Mis M.D.   On: 04/10/2020 19:56   DG Chest Portable 1 View  Result Date: 04/02/2020 CLINICAL DATA:  Left upper quadrant pain beginning 2 days prior radiating to left chest and back, history of CHF, CAD, diabetes EXAM: PORTABLE CHEST 1 VIEW COMPARISON:  Radiograph 11/03/2019, CT 09/02/2019 FINDINGS: Left IJ approach dual lumen dialysis catheter tip projects over the right atrium. Pacer pads and telemetry leads overlie the chest. Postsurgical changes related to prior CABG including intact and aligned sternotomy wires and multiple surgical clips projecting over the mediastinum. Cardiomegaly is similar to prior portable radiography. The aorta is calcified. The remaining cardiomediastinal contours are unremarkable. Mixed hazy and patchy opacities are present throughout both lungs with indistinct, congested pulmonary vascularity, fissural and septal thickening, and small bilateral effusions. Biapical pleuroparenchymal scarring. No pneumothorax. No acute osseous or soft tissue  abnormality. Degenerative changes are present in the imaged spine and shoulders. IMPRESSION: 1. Findings compatible with CHF/volume overload with cardiomegaly and edema, effusions. 2. More coalescent airspace disease is likely to reflect alveolar edema though infection could present similarly. Electronically Signed   By: Lovena Le M.D.   On: 04/02/2020 15:11   US Abdomen Limited RUQ (LIVER/GB)  Result Date: 04/04/2020 CLINICAL DATA:  69 year old female with possible cholecystitis. EXAM: ULTRASOUND ABDOMEN LIMITED RIGHT UPPER QUADRANT COMPARISON:  CT abdomen pelvis from 04/02/2020 and right upper quadrant ultrasounds from 05/01/2019 and 09/04/2019. FINDINGS: Gallbladder: The gallbladder is relatively decompressed with thickening of the gallbladder wall measuring up to 2.5 mm, significantly decreased from 09/04/2019 comparison. Layering sludge and a few small layering gallstones are again seen. Trace pericholecystic/perihepatic free fluid. Absent sonographic Murphy sign. Common bile duct: Diameter: 3.4 mm Liver: No focal lesion identified. Within normal limits in parenchymal echogenicity and echotexture. Portal vein is patent on color Doppler imaging with normal direction of blood flow towards the liver. Other: No perihepatic ascites. IMPRESSION: No convincing sonographic signs of acute cholecystitis. Chronic gallbladder wall thickening up to 2.5 mm, decreased from May 2021 comparison, most likely secondary to congestive heart failure although chronic cholecystitis is plausible. Similar appearing layering sludge and gallstones in a nondistended gallbladder. If clinical concern for acute cholecystitis persists, recommend HIDA scan for further evaluation. Ruthann Cancer, MD [facility] Electronically Signed   By: Ruthann Cancer MD   On: 04/04/2020 08:30      ASSESSMENT/PLAN    Loculated effusion along lateral right major fissure     - reviewed CT  chest as above     - effusion is minimal at this time     - there is no clinical signs of pneumonia at this time, patient is non febrile , denies cough or chest discomfort has no leukocytosis    -will obtain respiratory culture     -procalcitonin trend  -currently empirically on doxycycline and rocephin which is adequate for CAP therapy  -I do not think there is enough fluid to have thoracentesis done nor is there evidence to suggest empyema  -recommend incenitive spirometry - educated patient on appropriate technique -patient is currently on room air speaking in full sentences in no distress   Bibasilar atelectasis   - please encourage patient to use IS every day several times each hour        Thank you for allowing me to  participate in the care of this patient.   Patient/Family are satisfied with care plan and all questions have been answered.  This document was prepared using Dragon voice recognition software and may include unintentional dictation errors.     Ottie Glazier, M.D.  Division of [place]

## 2020-04-12 NOTE — NC FL2 (Signed)
[place] LEVEL OF CARE SCREENING TOOL     IDENTIFICATION  Patient Name: Stephanie Ellis Birthdate: 1950/09/25 Sex: female Admission Date (Current Location): 04/10/2020  [facility] and [place] Number:  Engineering geologist and Address:         Provider Number: [id number]  Attending Physician Name and Address:  Shawna Clamp, MD  Relative Name and Phone Number:  Conchita Paris (Daughter)   [phone] [facility] Phone)    Current Level of Care: Hospital Recommended Level of Care: [facility] Prior Approval Number:    Date Approved/Denied:   PASRR Number: [id number]  Discharge Plan: SNF    Current Diagnoses: Patient Active Problem List   Diagnosis Date Noted  . AMS (altered mental status) 04/10/2020  . Chronic combined systolic and diastolic CHF (congestive heart failure) ([place]) 04/02/2020  . Thrombocytopenia ([place]) 04/02/2020  . Chronic respiratory failure with hypoxia ([place]) 11/03/2019  . Nocturnal hypoxia   . Shortness of breath   . Abdominal swelling   . Acute on chronic systolic CHF (congestive heart failure) ([place])   . Acute respiratory failure with hypoxia ([place]) 09/02/2019  . Acute on chronic combined systolic (congestive) and diastolic (congestive) heart failure ([place]) 09/02/2019  . 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 Severe anemia 05/01/2019  . Pneumonia due to COVID-19 virus 05/01/2019  . Resides in skilled nursing facility 12/29/2018  . CVA (cerebral vascular accident) ([place]) 07/08/2018  . Infection of amputation stump ([place]) 05/02/2018  . BKA stump complication ([place]) 67/89/3810  . Unilateral complete BKA, right, initial encounter ([place]) 03/30/2018  . Transient loss of consciousness 03/19/2018  . Transient alteration of awareness   . VT (ventricular tachycardia) ([place])   . Disorientation   . Hypertensive urgency 03/08/2018  . Status post below-knee amputation of both lower extremities ([place]) 03/02/2018  . Ischemia of extremity 02/18/2018  .  CKD (chronic kidney disease) stage 5, GFR less than 15 ml/min (HCC) 02/16/2018  . Diabetic osteomyelitis ([place])   . Peripheral vascular disease ([place])   . Advanced care planning/counseling discussion   . Palliative care by specialist   . Goals of care, counseling/discussion   . Type 2 diabetes mellitus with hyperlipidemia ([place]) 01/30/2018  . Sepsis ([place]) 01/29/2018  . Peripheral neuropathy 06/17/2017  . Seizure disorder ([place]) 02/06/2017  . Accelerated hypertension 02/06/2017  . Endophthalmitis, left eye 12/05/2016  . Vomiting 10/24/2016  . Hematuria 10/17/2016  . Acute lower UTI 10/17/2016  . Anesthesia complication 17/51/0258  . Chest pain 09/04/2016  . Steal syndrome dialysis vascular access, initial encounter ([place]) 06/18/2016  . 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 Coronary artery disease involving coronary bypass graft of native heart with angina pectoris ([place]) 02/10/2016  . Hypertension 02/02/2016  . Hyperlipidemia 02/02/2016  . Coronary artery disease involving left main coronary artery 10/04/2015  . Abdominal pain of unknown etiology 10/04/2015  . ESRD (end stage renal disease) on dialysis ([place])   . Type II diabetes mellitus with complication ([place])   . NSTEMI (non-ST elevated myocardial infarction) ([place])   . Right sided abdominal pain   . Elevated troponin 10/03/2015  . 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 Acute on chronic combined systolic and diastolic CHF (congestive heart failure) ([place]) 04/19/2014  . Anemia 09/20/2013  . Routine health maintenance 01/29/2013    Orientation RESPIRATION BLADDER Height & Weight     Self,Situation,Place  Normal Continent (anuria) Weight: 110 lb 3.7 oz (50 kg) Height:  4' 4"  (132.1 cm)  BEHAVIORAL SYMPTOMS/MOOD NEUROLOGICAL BOWEL NUTRITION STATUS     (  epilepsy) Incontinent Diet (Diet Carb Modified Fluid consistency: Thin; Room service appropriate? Yes with Assist; Fluid restriction: 1200 mL Fluid)  AMBULATORY STATUS  COMMUNICATION OF NEEDS Skin   Extensive Assist Verbally                         Personal Care Assistance Level of Assistance  Bathing,Feeding,Dressing Bathing Assistance: Maximum assistance Feeding assistance: Limited assistance Dressing Assistance: Maximum assistance     Functional Limitations Info             SPECIAL CARE FACTORS FREQUENCY  PT (By licensed PT),OT (By licensed OT)     PT Frequency: 5 x/week OT Frequency: 5 x/week            Contractures      Additional Factors Info  Code Status,Allergies Code Status Info: full Allergies Info: chlorthalidone, dilaudid, fentanyl, midazolam, morphine and related, ace inhibitors, angiotensin receptor blockers, metoclopramide, norvasc, phernergan           Current Medications (04/12/2020):  This is the current hospital active medication list Current Facility-Administered Medications  Medication Dose Route Frequency Provider Last Rate Last Admin  . 0.9 %  sodium chloride infusion  250 mL Intravenous PRN Agbata, Tochukwu, MD      . apixaban (ELIQUIS) tablet 2.5 mg  2.5 mg Oral BID Benita Gutter, RPH   2.5 mg at 04/12/20 0855  . aspirin EC tablet 81 mg  81 mg Oral Daily Benita Gutter, RPH   81 mg at 04/12/20 0854  . atorvastatin (LIPITOR) tablet 80 mg  80 mg Oral QHS Agbata, Tochukwu, MD   80 mg at 04/11/20 2230  . brimonidine (ALPHAGAN) 0.2 % ophthalmic solution 1 drop  1 drop Both Eyes TID Benita Gutter, RPH   1 drop at 04/12/20 0859  . carvedilol (COREG) tablet 37.5 mg  37.5 mg Oral BID WC Dorothe Pea, RPH   37.5 mg at 04/12/20 0854  . cefTRIAXone (ROCEPHIN) 2 g in sodium chloride 0.9 % 100 mL IVPB  2 g Intravenous Q24H Elodia Florence., MD 200 mL/hr at 04/11/20 2230 2 g at 04/11/20 2230  . Chlorhexidine Gluconate Cloth 2 % PADS 6 each  6 each Topical Q0600 Murlean Iba, MD   6 each at 04/11/20 0902  . doxycycline (VIBRA-TABS) tablet 100 mg  100 mg Oral Q12H Elodia Florence., MD   100 mg  at 04/12/20 0855  . fluticasone (FLONASE) 50 MCG/ACT nasal spray 2 spray  2 spray Each Nare Daily Benita Gutter, RPH   2 spray at 04/12/20 0859  . hydrALAZINE (APRESOLINE) injection 5 mg  5 mg Intravenous Q6H PRN Elodia Florence., MD      . hydrALAZINE (APRESOLINE) tablet 50 mg  50 mg Oral Q8H Agbata, Tochukwu, MD   50 mg at 04/12/20 7893  . insulin aspart (novoLOG) injection 0-6 Units  0-6 Units Subcutaneous TID WC Agbata, Tochukwu, MD      . ipratropium (ATROVENT) 0.06 % nasal spray 2 spray  2 spray Each Nare BID Benita Gutter, RPH   2 spray at 04/12/20 0901  . isosorbide mononitrate (IMDUR) 24 hr tablet 30 mg  30 mg Oral Daily Benita Gutter, RPH   30 mg at 04/12/20 0855  . levETIRAcetam (KEPPRA) tablet 250 mg  250 mg Oral Q M,W,F-HD Benita Gutter, RPH      . levETIRAcetam (KEPPRA) tablet 500 mg  500 mg Oral BID  Benita Gutter, RPH   500 mg at 04/12/20 2355  . meclizine (ANTIVERT) tablet 25 mg  25 mg Oral Q4H PRN Agbata, Tochukwu, MD      . mirtazapine (REMERON) tablet 7.5 mg  7.5 mg Oral QHS Agbata, Tochukwu, MD   7.5 mg at 04/11/20 2230  . omega-3 acid ethyl esters (LOVAZA) capsule 2 g  2 g Oral Daily Benita Gutter, RPH   2 g at 04/12/20 7322  . ondansetron (ZOFRAN) tablet 4 mg  4 mg Oral Q6H PRN Agbata, Tochukwu, MD       Or  . ondansetron (ZOFRAN) injection 4 mg  4 mg Intravenous Q6H PRN Agbata, Tochukwu, MD      . pantoprazole (PROTONIX) EC tablet 40 mg  40 mg Oral Daily Agbata, Tochukwu, MD   40 mg at 04/12/20 0855  . polyvinyl alcohol (LIQUIFILM TEARS) 1.4 % ophthalmic solution 1 drop  1 drop Both Eyes PRN Benita Gutter, RPH   1 drop at 04/10/20 2143  . rOPINIRole (REQUIP) tablet 0.25 mg  0.25 mg Oral QHS Agbata, Tochukwu, MD   0.25 mg at 04/11/20 2230  . sevelamer carbonate (RENVELA) tablet 1,600 mg  1,600 mg Oral TID WC Lockie Mola B, RPH   1,600 mg at 04/12/20 1216  . sodium chloride flush (NS) 0.9 % injection 3 mL  3 mL Intravenous Q12H Agbata, Tochukwu,  MD   3 mL at 04/11/20 2232  . sodium chloride flush (NS) 0.9 % injection 3 mL  3 mL Intravenous Q12H Agbata, Tochukwu, MD   3 mL at 04/11/20 2233  . sodium chloride flush (NS) 0.9 % injection 3 mL  3 mL Intravenous PRN Agbata, Tochukwu, MD      . thiamine 575m in normal saline (570m IVPB  500 mg Intravenous Q8H PoElodia Florence MD 100 mL/hr at 04/12/20 0900 500 mg at 04/12/20 0900   Followed by  . [START ON 04/15/2020] thiamine (B-1) 250 mg in sodium chloride 0.9 % 50 mL IVPB  250 mg Intravenous Daily PoElodia Florence MD       Followed by  . [START ON 04/20/2020] thiamine (B-1) injection 100 mg  100 mg Intravenous Daily PoElodia Florence MD      . timolol (TIMOPTIC) 0.5 % ophthalmic solution 1 drop  1 drop Both Eyes TID ChBenita GutterRPH   1 drop at 04/12/20 0900  . triamcinolone (KENALOG) 0.1 % cream   Topical BID [facility]Benita [facility] Given at 04/12/20 0900  . valACYclovir (VALTREX) tablet 500 mg  500 mg Oral Daily ChBenita GutterRPH   500 mg at 04/11/20 1620  . vitamin B-12 (CYANOCOBALAMIN) tablet 1,000 mcg  1,000 mcg Oral Daily ChBenita GutterRPH   1,000 mcg at 04/12/20 [id number]   Discharge Medications: Please see discharge summary for a list of discharge medications.  Relevant Imaging Results:  Relevant Lab Results:   Additional Information SS#: [id number] patient MWF schedule  Amron Guerrette E Deakin Lacek, LCSW

## 2020-04-12 NOTE — Evaluation (Signed)
Physical Therapy Evaluation Patient Details Name: Stephanie Ellis MRN: [id number] DOB: 1950/10/30 Today's Date: 04/12/2020   History of Present Illness  Stephanie Ellis is a 54yoF who [facility] c AMS. PMH: DM, ESRD on HD MWF, HTN, PAD s/p bilateral BKA, CAD, CAD s/p CABG, left eye blindnss, heavy right eye visual impairment.  Clinical Impression  Pt admitted with above diagnosis. Pt currently with functional limitations due to the deficits listed below (see "PT Problem List"). Upon entry, pt in bed, asleep, but easily awakened and agreeable to participate. Sitter at bedside. The pt remains drowsy but interactive in an appropriate manner, has slight delayed response/inititation time. Pt pleasant, interactive. Previous day, DTR Stephanie Ellis was able to provide info regarding prior level of function, both in tolerance and independence, also dropped-off bilat prosthetic legs. BP too high this morning for OOB mobility, but pt readjusted in bed with ModA and set up for breakfast. BUE strength with mild weakness only. Patient's performance this date reveals decreased ability, independence, and tolerance in performing all basic mobility required for performance of activities of daily living. Pt requires additional DME, close physical assistance, and cues for safe participate in mobility. Will gradually progress mobility as it becomes medically appropriate. Pt will benefit from skilled PT intervention to increase independence and safety with basic mobility in preparation for discharge to the venue listed below.       Follow Up Recommendations SNF    Equipment Recommendations  None recommended by PT    Recommendations for Other Services       Precautions / Restrictions Precautions Precautions: Fall Restrictions Weight Bearing Restrictions: No      Mobility  Bed Mobility Overal bed mobility: Needs Assistance Bed Mobility:  (scooting up in bed)           General bed mobility comments:  normal gross motor patterns, but impaired problem solving, strength inadequate to perform without modA    Transfers Overall transfer level:  (deferred due to elevated BP, not yet medicated per RN: 093O systolic)                  Ambulation/Gait                Stairs            Wheelchair Mobility    Modified Rankin (Stroke Patients Only)       Balance Overall balance assessment: Modified Independent                                           Pertinent Vitals/Pain Pain Assessment: No/denies pain    Home Living Family/patient expects to be discharged to:: Private residence Living Arrangements: Spouse/significant other Available Help at Discharge: Family;Available 24 hours/day (2 DTRs nearby, 1 in [place] (pt live sin [place])) Type of Home: Mobile home Home Access: Ramped entrance     Home Layout: One level Home Equipment: [place] - 2 wheels;Wheelchair - manual Additional Comments: Bilat BKA prostheses; previously AMB household distances with RW;    Prior Function Level of Independence: Independent with assistive device(s)   Gait / Transfers Assistance Needed: WC for HD; prosthetic legs bilat c RW for household distance AMB;  ADL's / Homemaking Assistance Needed: independent at baseline, ADL, donning/doffing prestheses  Comments: Mod I - supervision level with all aspects of care. Pt does not use prosthetics and only "practice with them"  and reports not being seen by therapy at this time     Hand Dominance        Extremity/Trunk Assessment   Upper Extremity Assessment Upper Extremity Assessment: Generalized weakness    Lower Extremity Assessment Lower Extremity Assessment: Generalized weakness       Communication      Cognition Arousal/Alertness: Awake/alert Behavior During Therapy: WFL for tasks assessed/performed Overall Cognitive Status: No family/caregiver present to determine baseline cognitive functioning                                  General Comments: oriented to self, location, situation; still drowsy      General Comments      Exercises     Assessment/Plan    PT Assessment Patient needs continued PT services  PT Problem List Decreased cognition;Decreased activity tolerance;Decreased mobility;Decreased balance;Decreased safety awareness;Decreased knowledge of precautions       PT Treatment Interventions DME instruction;Balance training;Gait training;Stair training;Functional mobility training;Therapeutic activities;Therapeutic exercise;Patient/family education    PT Goals (Current goals can be found in the Care Plan section)  Acute Rehab PT Goals Patient Stated Goal: improve mentation, mobiltiy, safety to baseline PT Goal Formulation: With patient/family Time For Goal Achievement: 04/26/20 Potential to Achieve Goals: Good    Frequency Min 2X/week   Barriers to discharge Decreased caregiver support 2 DTRs unable to help 24/7 (both work out of town)    [employer] PT "6 Clicks" Mobility  Outcome Measure Help needed turning from your back to your side while in a flat bed without using bedrails?: A Lot Help needed moving from lying on your back to sitting on the side of a flat bed without using bedrails?: A Lot Help needed moving to and from a bed to a chair (including a wheelchair)?: Total Help needed standing up from a chair using your arms (e.g., wheelchair or bedside chair)?: Total Help needed to walk in hospital room?: Total Help needed climbing 3-5 steps with a railing? : Total 6 Click Score: 8    End of Session   Activity Tolerance: Patient tolerated treatment well;Treatment limited secondary to medical complications (Comment) (BP too high) Patient left: in bed;with nursing/sitter in room;with call bell/phone within reach Nurse Communication: Other (comment) (BP) PT Visit Diagnosis: Difficulty in walking, not elsewhere  classified (R26.2);Other abnormalities of gait and mobility (R26.89);Muscle weakness (generalized) (M62.81);Other symptoms and signs involving the nervous system (R29.898)    Time: 7116-5790 PT Time Calculation (min) (ACUTE ONLY): 13 min   Charges:   PT Evaluation $PT Eval Moderate Complexity: 1 Mod          9:04 AM, 04/12/20 Etta Grandchild, PT, DPT Physical Therapist - [facility]  [phone] ([place])    Nicole Hafley C 04/12/2020, 9:00 AM

## 2020-04-12 NOTE — Progress Notes (Signed)
SLP Cancellation Note  Patient Details Name: Stephanie Ellis MRN: [id number] DOB: 1950/08/24   Cancelled treatment:       Reason Eval/Treat Not Completed: SLP screened, no needs identified, will sign off   Chart reviewed, nursing consulted, passed [place] with no acute cognitive or dysphagia needs identified.   [name] B. Rutherford Nail M.S., CCC-SLP, [place] Office [phone]    Stormy Fabian 04/12/2020, 1:47 PM

## 2020-04-12 NOTE — Procedures (Signed)
Patient Name: Stephanie Ellis  MRN: [id number]  Epilepsy Attending: Lora Havens  Referring Physician/Provider: Dr. Fayrene Helper. Date: 04/11/2020 Duration: 21.55 minutes  Patient history: 69 year old female with history of epilepsy who presented with altered mental status.  EEG evaluate for seizures.  Level of alertness: Awake, asleep  AEDs during EEG study: Keppra.  Technical aspects: This EEG study was done with scalp electrodes positioned according to the 10-20 International system of electrode placement. Electrical activity was acquired at a sampling rate of 500Hz  and reviewed with a high frequency filter of 70Hz  and a low frequency filter of 1Hz . EEG data were recorded continuously and digitally stored.   Description: During awake state, no clear posterior dominant rhythm was seen.  Sleep was characterized by vertex waves, sleep spindles (12 to 14 Hz), maximal frontocentral region.  EEG showed continuous generalized 3 to 5 Hz theta-delta slowing.  Intermittent rhythmic generalized delta activity was also noted.  Sharp transients were seen in bilateral posterior quadrant.  Hyperventilation and photic stimulation were not performed.     ABNORMALITY -Continuous slow, generalized -Intermittent rhythmic delta activity, generalized  IMPRESSION: This study is suggestive of moderate diffuse encephalopathy, nonspecific etiology.  No seizures or definite epileptiform discharges were seen throughout the recording.   Aengus Sauceda Barbra Sarks

## 2020-04-12 NOTE — TOC Progression Note (Signed)
Transition of Care [facility]) - Progression Note    Patient Details  Name: Stephanie Ellis MRN: [id number] Date of Birth: Jan 01, 1951  Transition of Care [facility]) CM/SW [place], LCSW Phone Number: 04/12/2020, 2:06 PM  Clinical Narrative:   PT recommending SNF rehab. Spoke to patient who says she would be willing to go to SNF rehab. CSW will complete SNF work up.     Expected Discharge Plan: [place] Barriers to Discharge: Continued Medical Work up  Expected Discharge Plan and Services Expected Discharge Plan: [place]   Discharge Planning Services: CM Consult                                           Social Determinants of Health (SDOH) Interventions    Readmission Risk Interventions Readmission Risk Prevention Plan 04/11/2020 05/04/2019  Transportation Screening Complete Complete  PCP or Specialist Appt within 3-5 Days - Complete  Palliative Care Screening - Not Applicable  Medication Review ([place]) Complete Complete  PCP or Specialist appointment within 3-5 days of discharge Complete -  [place] or Home Care Consult Complete -  SW Recovery Care/Counseling Consult Complete -  Palliative Care Screening Not Applicable -  [place] Complete -  Some recent data might be hidden

## 2020-04-12 NOTE — Progress Notes (Signed)
[facility]  ROUNDING NOTE   Subjective:   [name] was admitted to [facility] on 04/10/2020 for Slurred speech [R47.81] Altered mental status, unspecified altered mental status type [R41.82] AMS (altered mental status) [R41.82]  Doing fair today.  More alert and able to answer questions appropriately.  Was able to verbalize her birthday, her age, year and month. Sitter in the room    Objective:  Vital signs in last 24 hours:  Temp:  [97.6 F (36.4 C)-98.4 F (36.9 C)] 98 F (36.7 C) (12/22 1145) Pulse Rate:  [65-81] 68 (12/22 0945) Resp:  [15-28] 15 (12/22 0945) BP: (160-197)/(48-82) 182/67 (12/22 1428) SpO2:  [96 %-99 %] 97 % (12/22 1145)  Weight change:  Filed Weights   04/10/20 1106  Weight: 50 kg    Intake/Output: I/O last 3 completed shifts: In: 230 [P.O.:230] Out: 500 [Other:500]   Intake/Output this shift:  Total I/O In: 480 [P.O.:480] Out: -   Physical Exam: General:  Resting in bed, in no acute distress  Head:  Oral mucous membranes moist  Eyes:  Sclerae and conjunctivae clear  Lungs:  Respirations symmetrical and unlabored, lungs clear  Heart:  S1-S2, no rubs or gallops  Abdomen:  Soft, nontender, nondistended  Extremities:  bilateral BKA, no edema noted in upper legs  Neurologic:  Alert, speech slurred, able to answer questions, oriented to self and time     Access: LIJ permcath    Basic Metabolic Panel: Recent Labs  Lab 04/10/20 1127 04/10/20 1129 04/11/20 0403 04/12/20 0553  NA 140  --  139 138  K 4.8  --  4.6 3.7  CL 97*  --  99 99  CO2 28  --  24 26  GLUCOSE 186*  --  121* 79  BUN 53*  --  57* 25*  CREATININE 9.33*  --  10.31* 6.32*  CALCIUM 8.9  --  8.7* 8.6*  MG  --  2.2  --  1.9  PHOS  --   --  4.2 3.3    Liver Function Tests: Recent Labs  Lab 04/10/20 1127 04/12/20 0553  AST 17 17  ALT 13 10  ALKPHOS 92 77  BILITOT 0.6 0.6  PROT 7.4 6.6  ALBUMIN 3.7 3.2*   No results for input(s): LIPASE, AMYLASE  in the last 168 hours. Recent Labs  Lab 04/10/20 1129  AMMONIA 19    CBC: Recent Labs  Lab 04/10/20 1127 04/11/20 0403 04/12/20 0553  WBC 4.2 3.9* 4.8  NEUTROABS 3.0  --  3.5  HGB 13.4 12.1 11.3*  HCT 43.2 39.6 36.3  MCV 96.4 98.3 96.3  PLT 113* 93* 97*    Cardiac Enzymes: No results for input(s): CKTOTAL, CKMB, CKMBINDEX, TROPONINI in the last 168 hours.  BNP: Invalid input(s): POCBNP  CBG: Recent Labs  Lab 04/10/20 2046 04/11/20 0843 04/11/20 1829 04/11/20 2157 04/12/20 0739  GLUCAP 133* 120* 90 97 86    Microbiology: Results for orders placed or performed during the hospital encounter of 04/10/20  Resp Panel by RT-PCR (Flu A&B, Covid) Nasopharyngeal Swab     Status: None   Collection Time: 04/10/20 12:58 PM   Specimen: Nasopharyngeal Swab; Nasopharyngeal(NP) swabs in vial transport medium  Result Value Ref Range Status   SARS Coronavirus 2 by RT PCR NEGATIVE NEGATIVE Final    Comment: (NOTE) SARS-CoV-2 target nucleic acids are NOT DETECTED.  The SARS-CoV-2 RNA is generally detectable in upper respiratory specimens during the acute phase of infection. The lowest concentration  of SARS-CoV-2 viral copies this assay can detect is 138 copies/mL. A negative result does not preclude SARS-Cov-2 infection and should not be used as the sole basis for treatment or other patient management decisions. A negative result may occur with  improper specimen collection/handling, submission of specimen other than nasopharyngeal swab, presence of viral mutation(s) within the areas targeted by this assay, and inadequate number of viral copies(<138 copies/mL). A negative result must be combined with clinical observations, patient history, and epidemiological information. The expected result is Negative.  Fact Sheet for Patients:  EntrepreneurPulse.com.au  Fact Sheet for Healthcare Providers:  IncredibleEmployment.be  This test is no t  yet approved or cleared by the [place] FDA and  has been authorized for detection and/or diagnosis of SARS-CoV-2 by FDA under an Emergency Use Authorization (EUA). This EUA will remain  in effect (meaning this test can be used) for the duration of the COVID-19 declaration under Section 564(b)(1) of the Act, 21 U.S.C.section 360bbb-3(b)(1), unless the authorization is terminated  or revoked sooner.       Influenza A by PCR NEGATIVE NEGATIVE Final   Influenza B by PCR NEGATIVE NEGATIVE Final    Comment: (NOTE) The Xpert Xpress SARS-CoV-2/FLU/RSV plus assay is intended as an aid in the diagnosis of influenza from Nasopharyngeal swab specimens and should not be used as a sole basis for treatment. Nasal washings and aspirates are unacceptable for Xpert Xpress SARS-CoV-2/FLU/RSV testing.  Fact Sheet for Patients: EntrepreneurPulse.com.au  Fact Sheet for Healthcare Providers: IncredibleEmployment.be  This test is not yet approved or cleared by the [place] FDA and has been authorized for detection and/or diagnosis of SARS-CoV-2 by FDA under an Emergency Use Authorization (EUA). This EUA will remain in effect (meaning this test can be used) for the duration of the COVID-19 declaration under Section 564(b)(1) of the Act, 21 U.S.C. section 360bbb-3(b)(1), unless the authorization is terminated or revoked.  Performed at [facility], [address] [place], [place] [address]   CULTURE, BLOOD (ROUTINE X 2) w Reflex to ID Panel     Status: None (Preliminary result)   Collection Time: 04/11/20  6:42 PM   Specimen: BLOOD  Result Value Ref Range Status   Specimen Description BLOOD BLOOD LEFT HAND  Final   Special Requests   Final    BOTTLES DRAWN AEROBIC AND ANAEROBIC Blood Culture results may not be optimal due to an inadequate volume of blood received in culture bottles   Culture   Final    NO GROWTH < 12 HOURS Performed at  [facility], [address] [place], [place] [address]    Report Status PENDING  Incomplete  CULTURE, BLOOD (ROUTINE X 2) w Reflex to ID Panel     Status: None (Preliminary result)   Collection Time: 04/11/20  6:50 PM   Specimen: BLOOD  Result Value Ref Range Status   Specimen Description BLOOD BLOOD LEFT FOREARM  Final   Special Requests   Final    BOTTLES DRAWN AEROBIC AND ANAEROBIC Blood Culture adequate volume   Culture   Final    NO GROWTH < 12 HOURS Performed at [facility], [address] [place], [place] [address]    Report Status PENDING  Incomplete    Coagulation Studies: No results for input(s): LABPROT, INR in the last 72 hours.  Urinalysis: No results for input(s): COLORURINE, LABSPEC, PHURINE, GLUCOSEU, HGBUR, BILIRUBINUR, KETONESUR, PROTEINUR, UROBILINOGEN, NITRITE, LEUKOCYTESUR in the last 72 hours.  Invalid input(s): APPERANCEUR  Imaging: EEG  Result Date: 04/12/2020 Lora Havens, MD     04/12/2020 11:37 AM Patient Name: Sharai Overbay MRN: [id number] Epilepsy Attending: Lora Havens Referring Physician/Provider: Dr. Fayrene Helper. Date: 04/11/2020 Duration: 21.55 minutes Patient history: 69 year old female with history of epilepsy who presented with altered mental status.  EEG evaluate for seizures. Level of alertness: Awake, asleep AEDs during EEG study: Keppra. Technical aspects: This EEG study was done with scalp electrodes positioned according to the 10-20 International system of electrode placement. Electrical activity was acquired at a sampling rate of 500Hz  and reviewed with a high frequency filter of 70Hz  and a low frequency filter of 1Hz . EEG data were recorded continuously and digitally stored. Description: During awake state, no clear posterior dominant rhythm was seen.  Sleep was characterized by vertex waves, sleep spindles (12 to 14 Hz), maximal frontocentral region.  EEG showed continuous generalized 3 to 5 Hz  theta-delta slowing.  Intermittent rhythmic generalized delta activity was also noted.  Sharp transients were seen in bilateral posterior quadrant.  Hyperventilation and photic stimulation were not performed.   ABNORMALITY -Continuous slow, generalized -Intermittent rhythmic delta activity, generalized IMPRESSION: This study is suggestive of moderate diffuse encephalopathy, nonspecific etiology.  No seizures or definite epileptiform discharges were seen throughout the recording. Lora Havens   CT CHEST WO CONTRAST  Result Date: 04/10/2020 CLINICAL DATA:  Altered mental status EXAM: CT CHEST WITHOUT CONTRAST TECHNIQUE: Multidetector CT imaging of the chest was performed following the standard protocol without IV contrast. COMPARISON:  Chest radiograph April 10, 2020; chest CT Sep 02, 2019 FINDINGS: Note that there is a degree of motion artifact. Cardiovascular: There is no thoracic aortic aneurysm. There are scattered foci of calcification in visualized great vessels. There is aortic atherosclerosis as well as multiple foci of coronary artery calcification. There is cardiomegaly. No pericardial effusion or pericardial thickening. Central catheter tip is in the right atrium, slightly beyond the cavoatrial junction. Mediastinum/Nodes: Thyroid appears unremarkable. No appreciable thoracic adenopathy. No esophageal lesions are evident. Lungs/Pleura: There is atelectatic change in the lung bases. There is suspected loculated effusion along the lateral aspect of the right major fissure. No frank airspace consolidation. No appreciable pleural effusions. No pneumothorax. Trachea and major bronchial structures appear patent, although there is a degree of motion artifact. Upper Abdomen: There is upper abdominal aortic as well as multiple mesenteric arterial vascular calcifications. Visualized upper abdominal structures otherwise appear unremarkable. Musculoskeletal: Status post median sternotomy. No blastic or  lytic bone lesions evident. No evident chest wall lesions. IMPRESSION: 1. Study somewhat less than optimal due to a degree of motion artifact. 2. Areas of atelectatic change. Mild loculated effusion along the lateral aspect of the right major fissure. No edema or consolidation. 3.  No evident adenopathy. 4. Aortic atherosclerosis as well as foci of great vessel and coronary artery calcification. There is a degree of cardiomegaly. Central catheter tip in right atrium. Aortic Atherosclerosis (ICD10-I70.0). Electronically Signed   By: Lowella Grip III M.D.   On: 04/10/2020 14:59   MR BRAIN WO CONTRAST  Result Date: 04/10/2020 CLINICAL DATA:  Mental status change EXAM: MRI HEAD WITHOUT CONTRAST TECHNIQUE: Multiplanar, multiecho pulse sequences of the brain and surrounding structures were obtained without intravenous contrast. COMPARISON:  March 2020 FINDINGS: Motion artifact is present. Brain: There is no acute infarction or intracranial hemorrhage. There is no intracranial mass, mass effect, or edema. There is no hydrocephalus or extra-axial fluid collection. Prominence of the ventricles and sulci reflects generalized parenchymal  volume loss. Patchy and confluent areas of T2 hyperintensity in the supratentorial white matter are nonspecific but probably reflect moderate chronic microvascular ischemic changes. 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 Small chronic infarcts of the cerebellum. There are a few scattered small foci of susceptibility hypointensity in the cerebral white matter likely reflecting chronic microhemorrhages. Vascular: Major vessel flow voids at the skull base are preserved. Skull and upper cervical spine: Normal marrow signal is preserved. Sinuses/Orbits: Patchy paranasal sinus mucosal thickening, including chronic left sphenoid mucosal thickening. Bilateral lens replacements. Other: Sella is unremarkable.  Patchy mastoid fluid opacification. IMPRESSION: No acute  infarction, hemorrhage, or mass. Chronic microvascular ischemic changes and few small chronic infarcts. No substantial change since 2020. Electronically Signed   By: Macy Mis M.D.   On: 04/10/2020 19:56     Medications:    Scheduled Meds: . apixaban  2.5 mg Oral BID  . aspirin EC  81 mg Oral Daily  . atorvastatin  80 mg Oral QHS  . brimonidine  1 drop Both Eyes TID  . carvedilol  37.5 mg Oral BID WC  . Chlorhexidine Gluconate Cloth  6 each Topical Q0600  . doxycycline  100 mg Oral Q12H  . fluticasone  2 spray Each Nare Daily  . hydrALAZINE  50 mg Oral Q8H  . insulin aspart  0-6 Units Subcutaneous TID WC  . ipratropium  2 spray Each Nare BID  . [START ON 04/13/2020] isosorbide mononitrate  60 mg Oral Daily  . levETIRAcetam  250 mg Oral Q M,W,F-HD  . levETIRAcetam  500 mg Oral BID  . mirtazapine  7.5 mg Oral QHS  . omega-3 acid ethyl esters  2 g Oral Daily  . pantoprazole  40 mg Oral Daily  . rOPINIRole  0.25 mg Oral QHS  . sevelamer carbonate  1,600 mg Oral TID WC  . sodium chloride flush  3 mL Intravenous Q12H  . sodium chloride flush  3 mL Intravenous Q12H  . [START ON 04/20/2020] thiamine injection  100 mg Intravenous Daily  . timolol  1 drop Both Eyes TID  . triamcinolone   Topical BID  . valACYclovir  500 mg Oral Daily  . vitamin B-12  1,000 mcg Oral Daily   Continuous Infusions: . sodium chloride    . cefTRIAXone (ROCEPHIN)  IV 2 g (04/11/20 2230)  . thiamine injection 500 mg (04/12/20 1431)   Followed by  . [START ON 04/15/2020] thiamine injection     PRN Meds:.sodium chloride, hydrALAZINE, meclizine, ondansetron **OR** ondansetron (ZOFRAN) IV, polyvinyl alcohol, sodium chloride flush    Assessment/ Plan:  [name] is a 70 y.o. black female with end stage renal disease on hemodialysis, peripheral vascular disease status post bilateral BKA, diabetes mellitus type II, coronary artery disease status post CABG admitted to [facility] on 04/10/2020 for Slurred  speech [R47.81] Altered mental status, unspecified altered mental status type [R41.82] AMS (altered mental status) [R41.82]  [facility] MWF [facility] [address] LIJ permcath 52kg  #  End Stage Renal Disease We will continue MWF schedule Patient had dialysis yesterday.  Plan on dialysis tomorrow and then continue Monday Wednesday Friday schedule   # Anemia of chronic kidney disease Lab Results  Component Value Date   HGB 11.3 (L) 04/12/2020   No indication for Epogen  #Secondary Hyperparathyroidism:  Lab Results  Component Value Date   PTH 385 (H) 09/03/2019   CALCIUM 8.6 (L) 04/12/2020   PHOS 3.3 04/12/2020  Will continue  monitoring bone mineral metabolism parameters  #Shingles   Patient is on valacyclovir About 1 inch area of crusting lesions on the left chest sidewall.  #Altered mental status EEG-no seizures seen Clinically improved today     LOS: 2 [place] 12/22/20212:45 PM

## 2020-04-12 NOTE — Progress Notes (Signed)
PROGRESS NOTE    Stephanie Ellis  GYJ:[id number] DOB: 19-Jan-1951 DOA: 04/10/2020 PCP: Pcp, No   Brief Narrative:  This 69 y.o.femalewith PMH significant fordiabetes mellitus , ESRD on HD (M/W/F) , hypertension, PAD s/p bilateral BKA,coronary artery disease status post CABG who was brought to the emergency room by EMS for evaluation of mental status changes. Patient was recently discharged from the hospital on 04/06/20.During that hospitalization she had a brief PEA arrest aftershe received hydromorphone and recovered immediately following administration of Narcan. Her daughter states that since her discharge she has not been herusual self and has been acting very erratic. At baseline patient is able to take care of herself and activities of daily living such as transfers, grooming and feeding herself. Since her discharge she has been very weak and has been unable to feed herself or transfer to her wheelchair. Family is also concerned that her speech is slurred.Patient refused to go to dialysis on the morning of her admission and so EMS was called.  Patient is admitted with acute encephalopathy without clear cause.  MRI brain no acute infarction, hemorrhage.  EEG: no evidence of seizures.  Ammonia level normal.  Neurology is consulted.  MRI does not show any evidence of stroke or anoxic brain injury.  There is no indication for LP at this time.  Recommended continued thiamine treatment.  Nephrology consulted for continuation of hemodialysis.  Pulmonology consulted for mild loculated effusion on CT chest. Her mental status has been improving.  Assessment & Plan:   Principal Problem:   AMS (altered mental status) Active Problems:   ESRD (end stage renal disease) on dialysis ([place])   Type II diabetes mellitus with complication (HCC)   Hypertension   Seizure disorder (HCC)   Cirrhosis, cardiac   Acute Metabolic Encephalopathy >> Improving. Unclear etiology, at this time - ddx  includes anoxic brain injury related to PEA arrest at last hospitalization, encephalitis in setting of shingles, hypertensive encephalopathy. MRI brian without acute findings (chronic microvascular ischemic changes and few small chronic infarcts) EEG no evidence of seizures. Neurology consulted.  Recommended to continue thiamine.  Keppra level and B12 level pending Ammonia level normal. Blood cultures no growth so far. Mental status is improving.  She is alert and oriented x2.  Mild Loculated Effusion : Assume this could be pneumonia.   Continue ceftriaxone and doxycycline for CAP Blood cultures no growth so far. Pulmonology consulted,  awaiting recommendation.  Physical deconditioning Most likely secondary to recent hospital stay. PT recommended SNF. Continue fall precautions.  Hypertensive urgency : Home blood pressure medication resumed.  Carvedilol, hydralazine and Imdur IV hydralazine prn for SBP > 263  [id number] with complications of end-stage renal disease on hemodialysis Maintain carb consistent diet. Glycemic control with sliding scale insulin. Nephrology consulted for renal replacement therapy  Chronic combined systolic and diastolic dysfunction CHF Patient's last known LVEF is about 35 to 40% from an echocardiogram done in 2019 Stable and not acutely exacerbated Volume per renal  Seizure disorder Continue Keppra Place patient on seizure precautions EEG no evidence of seizures.  Thrombocytopenia Chronic, continue to monitor.  Peripheral arterial disease Status post bilateral BKA Continue statin, apixaban and carvedilol  Depression Continue Remeron   DVT prophylaxis: Eliquis Code Status: Full code Family Communication: No Family in the room Disposition Plan:   Status is: Inpatient  Remains inpatient appropriate because:Inpatient level of care appropriate due to severity of illness   Dispo: The patient is from: Home  Anticipated d/c  is to:  SNF              Anticipated d/c date is: 2 days              Patient currently is not medically stable to d/c.  Consultants:   Pulmonology  Nephrology  Neurology  Procedures: EEG  Antimicrobials: Anti-infectives (From admission, onward)   Start     Dose/Rate Route Frequency Ordered Stop   04/11/20 1800  valACYclovir (VALTREX) tablet 500 mg        500 mg Oral Daily 04/10/20 2043     04/11/20 1330  doxycycline (VIBRA-TABS) tablet 100 mg        100 mg Oral Every 12 hours 04/11/20 1244     04/11/20 1300  cefTRIAXone (ROCEPHIN) 2 g in sodium chloride 0.9 % 100 mL IVPB        2 g 200 mL/hr over 30 Minutes Intravenous Every 24 hours 04/11/20 1239 04/16/20 1259   04/11/20 1300  azithromycin (ZITHROMAX) 500 mg in sodium chloride 0.9 % 250 mL IVPB  Status:  Discontinued        500 mg 250 mL/hr over 60 Minutes Intravenous Every 24 hours 04/11/20 1239 04/11/20 1244   04/10/20 1500  valACYclovir (VALTREX) tablet 500 mg  Status:  Discontinued        500 mg Oral Daily 04/10/20 1446 04/10/20 2042      Subjective: Patient was seen and examined at bedside.  Overnight events noted.  Patient appears more improved.  She is alert and oriented x 2.  She denies any complaints.  Objective: Vitals:   04/12/20 0419 04/12/20 0745 04/12/20 0945 04/12/20 1145  BP: (!) 164/76 (!) 186/76 (!) 160/48 (!) 181/48  Pulse: 81 66 68   Resp: 19 (!) 28 15   Temp: 98.2 F (36.8 C) 98 F (36.7 C) 98.4 F (36.9 C) 98 F (36.7 C)  TempSrc: Oral Oral Oral Oral  SpO2:  99% 96% 97%  Weight:      Height:        Intake/Output Summary (Last 24 hours) at 04/12/2020 1153 Last data filed at 04/12/2020 1700 Gross per 24 hour  Intake 370 ml  Output 500 ml  Net -130 ml   Filed Weights   04/10/20 1106  Weight: 50 kg    Examination:  General exam: Appears calm and comfortable, not in any distress. Respiratory system: Clear to auscultation. Respiratory effort normal. Cardiovascular system: S1 & S2  heard, RRR. No JVD, murmurs, rubs, gallops or clicks. No pedal edema. Gastrointestinal system: Abdomen is nondistended, soft and nontender. No organomegaly or masses felt. Normal bowel sounds heard. Central nervous system: Alert and oriented x 2. No focal neurological deficits. Extremities: Bilateral BKA. Skin: No rashes, lesions or ulcers. Psychiatry: Judgement and insight appear normal. Mood & affect appropriate.     Data Reviewed: I have personally reviewed following labs and imaging studies  CBC: Recent Labs  Lab 04/10/20 1127 04/11/20 0403 04/12/20 0553  WBC 4.2 3.9* 4.8  NEUTROABS 3.0  --  3.5  HGB 13.4 12.1 11.3*  HCT 43.2 39.6 36.3  MCV 96.4 98.3 96.3  PLT 113* 93* 97*   Basic Metabolic Panel: Recent Labs  Lab 04/10/20 1127 04/10/20 1129 04/11/20 0403 04/12/20 0553  NA 140  --  139 138  K 4.8  --  4.6 3.7  CL 97*  --  99 99  CO2 28  --  24 26  GLUCOSE 186*  --  121* 79  BUN 53*  --  57* 25*  CREATININE 9.33*  --  10.31* 6.32*  CALCIUM 8.9  --  8.7* 8.6*  MG  --  2.2  --  1.9  PHOS  --   --  4.2 3.3   GFR: Estimated Creatinine Clearance: 4.8 mL/min (A) (by C-G formula based on SCr of 6.32 mg/dL (H)). Liver Function Tests: Recent Labs  Lab 04/10/20 1127 04/12/20 0553  AST 17 17  ALT 13 10  ALKPHOS 92 77  BILITOT 0.6 0.6  PROT 7.4 6.6  ALBUMIN 3.7 3.2*   No results for input(s): LIPASE, AMYLASE in the last 168 hours. Recent Labs  Lab 04/10/20 1129  AMMONIA 19   Coagulation Profile: No results for input(s): INR, PROTIME in the last 168 hours. Cardiac Enzymes: No results for input(s): CKTOTAL, CKMB, CKMBINDEX, TROPONINI in the last 168 hours. BNP (last 3 results) No results for input(s): PROBNP in the last 8760 hours. HbA1C: No results for input(s): HGBA1C in the last 72 hours. CBG: Recent Labs  Lab 04/10/20 2046 04/11/20 0843 04/11/20 1829 04/11/20 2157 04/12/20 0739  GLUCAP 133* 120* 90 97 86   Lipid Profile: No results for  input(s): CHOL, HDL, LDLCALC, TRIG, CHOLHDL, LDLDIRECT in the last 72 hours. Thyroid Function Tests: Recent Labs    04/11/20 1842  TSH 11.411*   Anemia Panel: Recent Labs    04/11/20 1842  VITAMINB12 2,909*   Sepsis Labs: No results for input(s): PROCALCITON, LATICACIDVEN in the last 168 hours.  Recent Results (from the past 240 hour(s))  Resp Panel by RT-PCR (Flu A&B, Covid) Nasopharyngeal Swab     Status: None   Collection Time: 04/02/20  2:25 PM   Specimen: Nasopharyngeal Swab; Nasopharyngeal(NP) swabs in vial transport medium  Result Value Ref Range Status   SARS Coronavirus 2 by RT PCR NEGATIVE NEGATIVE Final    Comment: (NOTE) SARS-CoV-2 target nucleic acids are NOT DETECTED.  The SARS-CoV-2 RNA is generally detectable in upper respiratory specimens during the acute phase of infection. The lowest concentration of SARS-CoV-2 viral copies this assay can detect is 138 copies/mL. A negative result does not preclude SARS-Cov-2 infection and should not be used as the sole basis for treatment or other patient management decisions. A negative result may occur with  improper specimen collection/handling, submission of specimen other than nasopharyngeal swab, presence of viral mutation(s) within the areas targeted by this assay, and inadequate number of viral copies(<138 copies/mL). A negative result must be combined with clinical observations, patient history, and epidemiological information. The expected result is Negative.  Fact Sheet for Patients:  EntrepreneurPulse.com.au  Fact Sheet for Healthcare Providers:  IncredibleEmployment.be  This test is no t yet approved or cleared by the [place] FDA and  has been authorized for detection and/or diagnosis of SARS-CoV-2 by FDA under an Emergency Use Authorization (EUA). This EUA will remain  in effect (meaning this test can be used) for the duration of the COVID-19 declaration under  Section 564(b)(1) of the Act, 21 U.S.C.section 360bbb-3(b)(1), unless the authorization is terminated  or revoked sooner.       Influenza A by PCR NEGATIVE NEGATIVE Final   Influenza B by PCR NEGATIVE NEGATIVE Final    Comment: (NOTE) The Xpert Xpress SARS-CoV-2/FLU/RSV plus assay is intended as an aid in the diagnosis of influenza from Nasopharyngeal swab specimens and should not be used as a sole basis for treatment. Nasal washings and aspirates are unacceptable for Xpert Xpress SARS-CoV-2/FLU/RSV testing.  Fact Sheet for Patients: EntrepreneurPulse.com.au  Fact Sheet for Healthcare Providers: IncredibleEmployment.be  This test is not yet approved or cleared by the [place] FDA and has been authorized for detection and/or diagnosis of SARS-CoV-2 by FDA under an Emergency Use Authorization (EUA). This EUA will remain in effect (meaning this test can be used) for the duration of the COVID-19 declaration under Section 564(b)(1) of the Act, 21 U.S.C. section 360bbb-3(b)(1), unless the authorization is terminated or revoked.  Performed at [facility], [address] [place], [place] [address]   MRSA PCR Screening     Status: None   Collection Time: 04/04/20  6:00 AM   Specimen: Nasopharyngeal  Result Value Ref Range Status   MRSA by PCR NEGATIVE NEGATIVE Final    Comment:        The GeneXpert MRSA Assay (FDA approved for NASAL specimens only), is one component of a comprehensive MRSA colonization surveillance program. It is not intended to diagnose MRSA infection nor to guide or monitor treatment for MRSA infections. Performed at [facility], [address] [place], [place] [address]   Resp Panel by RT-PCR (Flu A&B, Covid) Nasopharyngeal Swab     Status: None   Collection Time: 04/10/20 12:58 PM   Specimen: Nasopharyngeal Swab; Nasopharyngeal(NP) swabs in vial transport medium  Result Value Ref Range  Status   SARS Coronavirus 2 by RT PCR NEGATIVE NEGATIVE Final    Comment: (NOTE) SARS-CoV-2 target nucleic acids are NOT DETECTED.  The SARS-CoV-2 RNA is generally detectable in upper respiratory specimens during the acute phase of infection. The lowest concentration of SARS-CoV-2 viral copies this assay can detect is 138 copies/mL. A negative result does not preclude SARS-Cov-2 infection and should not be used as the sole basis for treatment or other patient management decisions. A negative result may occur with  improper specimen collection/handling, submission of specimen other than nasopharyngeal swab, presence of viral mutation(s) within the areas targeted by this assay, and inadequate number of viral copies(<138 copies/mL). A negative result must be combined with clinical observations, patient history, and epidemiological information. The expected result is Negative.  Fact Sheet for Patients:  EntrepreneurPulse.com.au  Fact Sheet for Healthcare Providers:  IncredibleEmployment.be  This test is no t yet approved or cleared by the [place] FDA and  has been authorized for detection and/or diagnosis of SARS-CoV-2 by FDA under an Emergency Use Authorization (EUA). This EUA will remain  in effect (meaning this test can be used) for the duration of the COVID-19 declaration under Section 564(b)(1) of the Act, 21 U.S.C.section 360bbb-3(b)(1), unless the authorization is terminated  or revoked sooner.       Influenza A by PCR NEGATIVE NEGATIVE Final   Influenza B by PCR NEGATIVE NEGATIVE Final    Comment: (NOTE) The Xpert Xpress SARS-CoV-2/FLU/RSV plus assay is intended as an aid in the diagnosis of influenza from Nasopharyngeal swab specimens and should not be used as a sole basis for treatment. Nasal washings and aspirates are unacceptable for Xpert Xpress SARS-CoV-2/FLU/RSV testing.  Fact Sheet for  Patients: EntrepreneurPulse.com.au  Fact Sheet for Healthcare Providers: IncredibleEmployment.be  This test is not yet approved or cleared by the [place] FDA and has been authorized for detection and/or diagnosis of SARS-CoV-2 by FDA under an Emergency Use Authorization (EUA). This EUA will remain in effect (meaning this test can be used) for the duration of the COVID-19 declaration under Section 564(b)(1) of the Act, 21 U.S.C. section 360bbb-3(b)(1), unless the authorization is terminated  or revoked.  Performed at [facility], [address] [place], [place] [address]   CULTURE, BLOOD (ROUTINE X 2) w Reflex to ID Panel     Status: None (Preliminary result)   Collection Time: 04/11/20  6:42 PM   Specimen: BLOOD  Result Value Ref Range Status   Specimen Description BLOOD BLOOD LEFT HAND  Final   Special Requests   Final    BOTTLES DRAWN AEROBIC AND ANAEROBIC Blood Culture results may not be optimal due to an inadequate volume of blood received in culture bottles   Culture   Final    NO GROWTH < 12 HOURS Performed at [facility], [address] [place], [place] [address]    Report Status PENDING  Incomplete  CULTURE, BLOOD (ROUTINE X 2) w Reflex to ID Panel     Status: None (Preliminary result)   Collection Time: 04/11/20  6:50 PM   Specimen: BLOOD  Result Value Ref Range Status   Specimen Description BLOOD BLOOD LEFT FOREARM  Final   Special Requests   Final    BOTTLES DRAWN AEROBIC AND ANAEROBIC Blood Culture adequate volume   Culture   Final    NO GROWTH < 12 HOURS Performed at [facility], [address], [place], [place] [address]    Report Status PENDING  Incomplete         Radiology Studies: EEG  Result Date: 04/12/2020 Lora Havens, MD     04/12/2020 11:37 AM Patient Name: Stephanie Ellis MRN: [id number] Epilepsy Attending: Lora Havens Referring Physician/Provider: Dr. Fayrene Helper. Date: 04/11/2020 Duration: 21.55 minutes Patient history: 69 year old female with history of epilepsy who presented with altered mental status.  EEG evaluate for seizures. Level of alertness: Awake, asleep AEDs during EEG study: Keppra. Technical aspects: This EEG study was done with scalp electrodes positioned according to the 10-20 International system of electrode placement. Electrical activity was acquired at a sampling rate of 500Hz  and reviewed with a high frequency filter of 70Hz  and a low frequency filter of 1Hz . EEG data were recorded continuously and digitally stored. Description: During awake state, no clear posterior dominant rhythm was seen.  Sleep was characterized by vertex waves, sleep spindles (12 to 14 Hz), maximal frontocentral region.  EEG showed continuous generalized 3 to 5 Hz theta-delta slowing.  Intermittent rhythmic generalized delta activity was also noted.  Sharp transients were seen in bilateral posterior quadrant.  Hyperventilation and photic stimulation were not performed.   ABNORMALITY -Continuous slow, generalized -Intermittent rhythmic delta activity, generalized IMPRESSION: This study is suggestive of moderate diffuse encephalopathy, nonspecific etiology.  No seizures or definite epileptiform discharges were seen throughout the recording. Lora Havens   DG Chest 2 View  Result Date: 04/10/2020 CLINICAL DATA:  Altered mental status EXAM: CHEST - 2 VIEW COMPARISON:  April 02, 2020 FINDINGS: Central catheter tip is in the right atrium, slightly distal to the cavoatrial junction. No pneumothorax. There is a small area of ill-defined opacity in the lateral right base. Lungs elsewhere are clear. There is cardiomegaly with pulmonary vascular normal. Patient is status post coronary artery bypass grafting. There is aortic atherosclerosis. No bone lesions. IMPRESSION: Central catheter as described without pneumothorax. Small area of suspected pneumonia lateral right base.  Lungs elsewhere clear. There is cardiomegaly with postoperative coronary artery bypass grafting change. Aortic Atherosclerosis (ICD10-I70.0). Electronically Signed   By: Lowella Grip III M.D.   On: 04/10/2020 12:19   CT Head Wo Contrast  Result Date:  04/10/2020 CLINICAL DATA:  Altered mental status EXAM: CT HEAD WITHOUT CONTRAST TECHNIQUE: Contiguous axial images were obtained from the base of the skull through the vertex without intravenous contrast. COMPARISON:  05/01/2019 FINDINGS: Brain: No evidence of acute infarction, hemorrhage, hydrocephalus, extra-axial collection or mass lesion/mass effect. Scattered low-density changes within the periventricular and subcortical white matter compatible with chronic microvascular ischemic change. Mild diffuse cerebral volume loss. Vascular: Atherosclerotic calcifications involving the large vessels of the skull base. No unexpected hyperdense vessel. Skull: Normal. Negative for fracture or focal lesion. Sinuses/Orbits: Chronic mucosal thickening within the left sphenoid sinus and posterior left ethmoid air cells. Remaining paranasal sinuses and mastoid air cells are clear. Other: None. IMPRESSION: 1. No acute intracranial findings. 2. Chronic microvascular ischemic change and cerebral volume loss. Electronically Signed   By: Davina Poke D.O.   On: 04/10/2020 12:29   CT CHEST WO CONTRAST  Result Date: 04/10/2020 CLINICAL DATA:  Altered mental status EXAM: CT CHEST WITHOUT CONTRAST TECHNIQUE: Multidetector CT imaging of the chest was performed following the standard protocol without IV contrast. COMPARISON:  Chest radiograph April 10, 2020; chest CT Sep 02, 2019 FINDINGS: Note that there is a degree of motion artifact. Cardiovascular: There is no thoracic aortic aneurysm. There are scattered foci of calcification in visualized great vessels. There is aortic atherosclerosis as well as multiple foci of coronary artery calcification. There is cardiomegaly.  No pericardial effusion or pericardial thickening. Central catheter tip is in the right atrium, slightly beyond the cavoatrial junction. Mediastinum/Nodes: Thyroid appears unremarkable. No appreciable thoracic adenopathy. No esophageal lesions are evident. Lungs/Pleura: There is atelectatic change in the lung bases. There is suspected loculated effusion along the lateral aspect of the right major fissure. No frank airspace consolidation. No appreciable pleural effusions. No pneumothorax. Trachea and major bronchial structures appear patent, although there is a degree of motion artifact. Upper Abdomen: There is upper abdominal aortic as well as multiple mesenteric arterial vascular calcifications. Visualized upper abdominal structures otherwise appear unremarkable. Musculoskeletal: Status post median sternotomy. No blastic or lytic bone lesions evident. No evident chest wall lesions. IMPRESSION: 1. Study somewhat less than optimal due to a degree of motion artifact. 2. Areas of atelectatic change. Mild loculated effusion along the lateral aspect of the right major fissure. No edema or consolidation. 3.  No evident adenopathy. 4. Aortic atherosclerosis as well as foci of great vessel and coronary artery calcification. There is a degree of cardiomegaly. Central catheter tip in right atrium. Aortic Atherosclerosis (ICD10-I70.0). Electronically Signed   By: Lowella Grip III M.D.   On: 04/10/2020 14:59   MR BRAIN WO CONTRAST  Result Date: 04/10/2020 CLINICAL DATA:  Mental status change EXAM: MRI HEAD WITHOUT CONTRAST TECHNIQUE: Multiplanar, multiecho pulse sequences of the brain and surrounding structures were obtained without intravenous contrast. COMPARISON:  March 2020 FINDINGS: Motion artifact is present. Brain: There is no acute infarction or intracranial hemorrhage. There is no intracranial mass, mass effect, or edema. There is no hydrocephalus or extra-axial fluid collection. Prominence of the ventricles  and sulci reflects generalized parenchymal volume loss. Patchy and confluent areas of T2 hyperintensity in the supratentorial white matter are nonspecific but probably reflect moderate chronic microvascular ischemic changes. 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 Small chronic infarcts of the cerebellum. There are a few scattered small foci of susceptibility hypointensity in the cerebral white matter likely reflecting chronic microhemorrhages. Vascular: Major vessel flow voids at the skull base are preserved. Skull and upper cervical  spine: Normal marrow signal is preserved. Sinuses/Orbits: Patchy paranasal sinus mucosal thickening, including chronic left sphenoid mucosal thickening. Bilateral lens replacements. Other: Sella is unremarkable.  Patchy mastoid fluid opacification. IMPRESSION: No acute infarction, hemorrhage, or mass. Chronic microvascular ischemic changes and few small chronic infarcts. No substantial change since 2020. Electronically Signed   By: Macy Mis M.D.   On: 04/10/2020 19:56   Scheduled Meds: . apixaban  2.5 mg Oral BID  . aspirin EC  81 mg Oral Daily  . atorvastatin  80 mg Oral QHS  . brimonidine  1 drop Both Eyes TID  . carvedilol  37.5 mg Oral BID WC  . Chlorhexidine Gluconate Cloth  6 each Topical Q0600  . doxycycline  100 mg Oral Q12H  . fluticasone  2 spray Each Nare Daily  . hydrALAZINE  50 mg Oral Q8H  . insulin aspart  0-6 Units Subcutaneous TID WC  . ipratropium  2 spray Each Nare BID  . isosorbide mononitrate  30 mg Oral Daily  . levETIRAcetam  250 mg Oral Q M,W,F-HD  . levETIRAcetam  500 mg Oral BID  . mirtazapine  7.5 mg Oral QHS  . omega-3 acid ethyl esters  2 g Oral Daily  . pantoprazole  40 mg Oral Daily  . rOPINIRole  0.25 mg Oral QHS  . sevelamer carbonate  1,600 mg Oral TID WC  . sodium chloride flush  3 mL Intravenous Q12H  . sodium chloride flush  3 mL Intravenous Q12H  . [START ON 04/20/2020] thiamine injection  100 mg  Intravenous Daily  . timolol  1 drop Both Eyes TID  . triamcinolone   Topical BID  . valACYclovir  500 mg Oral Daily  . vitamin B-12  1,000 mcg Oral Daily   Continuous Infusions: . sodium chloride    . cefTRIAXone (ROCEPHIN)  IV 2 g (04/11/20 2230)  . thiamine injection 500 mg (04/12/20 0900)   Followed by  . [START ON 04/15/2020] thiamine injection       LOS: 2 days    Time spent: 35 mins.    Shawna Clamp, MD [facility]   If 7PM-7AM, please contact night-coverage

## 2020-04-13 DIAGNOSIS — R410 Disorientation, unspecified: Secondary | ICD-10-CM | POA: Diagnosis not present

## 2020-04-13 LAB — BASIC METABOLIC PANEL
Anion gap: 10 (ref 5–15)
BUN: 37 mg/dL — ABNORMAL HIGH (ref 8–23)
CO2: 25 mmol/L (ref 22–32)
Calcium: 8.1 mg/dL — ABNORMAL LOW (ref 8.9–10.3)
Chloride: 101 mmol/L (ref 98–111)
Creatinine, Ser: 8.01 mg/dL — ABNORMAL HIGH (ref 0.44–1.00)
GFR, Estimated: 5 mL/min — ABNORMAL LOW (ref >60)
Glucose, Bld: 73 mg/dL (ref 70–99)
Potassium: 4.1 mmol/L (ref 3.5–5.1)
Sodium: 136 mmol/L (ref 135–145)

## 2020-04-13 LAB — CBC
HCT: 38 % (ref 36.0–46.0)
Hemoglobin: 11.5 g/dL — ABNORMAL LOW (ref 12.0–15.0)
MCH: 29.3 pg (ref 26.0–34.0)
MCHC: 30.3 g/dL (ref 30.0–36.0)
MCV: 96.9 fL (ref 80.0–100.0)
Platelets: 95 10*3/uL — ABNORMAL LOW (ref 150–400)
RBC: 3.92 MIL/uL (ref 3.87–5.11)
RDW: 16.5 % — ABNORMAL HIGH (ref 11.5–15.5)
WBC: 4.4 10*3/uL (ref 4.0–10.5)
nRBC: 0 % (ref 0.0–0.2)

## 2020-04-13 LAB — GLUCOSE, CAPILLARY
Glucose-Capillary: 77 mg/dL (ref 70–99)
Glucose-Capillary: 75 mg/dL (ref 70–99)
Glucose-Capillary: 46 mg/dL — ABNORMAL LOW (ref 70–99)
Glucose-Capillary: 159 mg/dL — ABNORMAL HIGH (ref 70–99)
Glucose-Capillary: 70 mg/dL (ref 70–99)

## 2020-04-13 LAB — PHOSPHORUS: Phosphorus: 4.1 mg/dL (ref 2.5–4.6)

## 2020-04-13 LAB — MAGNESIUM: Magnesium: 2 mg/dL (ref 1.7–2.4)

## 2020-04-13 MED ORDER — DEXTROSE 50 % IV SOLN: 1.0000 | INTRAVENOUS | Status: AC

## 2020-04-13 MED ORDER — DEXTROSE 50 % IV SOLN
INTRAVENOUS | Status: AC
  Administered 2020-04-13: 16:00:00 50 mL via INTRAVENOUS
  Filled 2020-04-13: qty 50

## 2020-04-13 MED ORDER — LEVETIRACETAM IN NACL 500 MG/100ML IV SOLN
500.0000 mg | Freq: Two times a day (BID) | INTRAVENOUS | Status: DC
  Administered 2020-04-13 – 2020-04-14 (×2): 500 mg via INTRAVENOUS
  Filled 2020-04-13 (×3): qty 100

## 2020-04-13 MED ORDER — HYDRALAZINE HCL 50 MG PO TABS
75.0000 mg | ORAL_TABLET | Freq: Three times a day (TID) | ORAL | Status: DC
  Administered 2020-04-13 – 2020-04-19 (×17): 75 mg via ORAL
  Filled 2020-04-13 (×20): qty 2

## 2020-04-13 NOTE — Progress Notes (Addendum)
PROGRESS NOTE    Stephanie Ellis  BSW:[id number] DOB: 06-19-1950 DOA: 04/10/2020 PCP: Pcp, No   Brief Narrative:  This 69 y.o.femalewith PMH significant fordiabetes mellitus , ESRD on HD (M/W/F) , hypertension, PAD s/p bilateral BKA,coronary artery disease status post CABG who was brought to the emergency room by EMS for evaluation of mental status changes. Patient was recently discharged from the hospital on 04/06/20.During that hospitalization she had a brief PEA arrest aftershe received hydromorphone and recovered immediately following administration of Narcan. Her daughter states that since her discharge she has not been herusual self and has been acting very erratic. At baseline patient is able to take care of herself and activities of daily living such as transfers, grooming and feeding herself. Since her discharge she has been very weak and has been unable to feed herself or transfer to her wheelchair. Family is also concerned that her speech is slurred.Patient refused to go to dialysis on the morning of her admission and so EMS was called.  Patient is admitted with acute encephalopathy without clear cause.  MRI brain no acute infarction, hemorrhage.  EEG: no evidence of seizures.  Ammonia level normal.  Neurology is consulted.  MRI does not show any evidence of stroke or anoxic brain injury.  There is no indication for LP at this time. Recommended continued thiamine treatment.  Nephrology consulted for continuation of hemodialysis.  Pulmonology consulted for mild loculated effusion on CT chest.  There is no signs of empyema or effusion is very small for thoracocentesis.  Continue empirical treatment for CAP and encourage incentive spirometry.  Her mental status has been improving.  Assessment & Plan:   Principal Problem:   AMS (altered mental status) Active Problems:   ESRD (end stage renal disease) on dialysis ([place])   Type II diabetes mellitus with complication (HCC)    Hypertension   Seizure disorder (HCC)   Cirrhosis, cardiac   Acute Metabolic Encephalopathy >> Improved Unclear etiology, at this time - ddx includes anoxic brain injury related to PEA arrest at last hospitalization, encephalitis in setting of shingles, hypertensive encephalopathy. MRI brian without acute findings (chronic microvascular ischemic changes and few small chronic infarcts) EEG no evidence of seizures. Neurology consulted.  Recommended to continue thiamine.  Keppra level pending Ammonia level normal.  Vitamin B12 2900. Blood cultures no growth so far. Mental status is improving.  She is alert and oriented x2.  Mild Loculated Effusion : Assume this could be pneumonia.   Continue ceftriaxone and doxycycline for CAP Blood cultures no growth so far. Pulmonology consulted, recommended to continue treatment for CAP.   There is no empyema or effusion is very small for thoracocentesis. Encourage incentive spirometry.  Physical deconditioning Most likely secondary to recent hospital stay. PT recommended SNF. Continue fall precautions.  Hypertensive urgency : Home blood pressure medication resumed.  Carvedilol, hydralazine and Imdur. Blood pressure still remains elevated with increase in Imdur to 60 mg daily Increase Hydralazine to 75 mg every 8 hrs IV hydralazine prn for SBP > 466  [id number] with complications of end-stage renal disease on hemodialysis Maintain carb consistent diet. Glycemic control with sliding scale insulin. Nephrology consulted for renal replacement therapy Patient is going to get hemodialysis today and then will be continued on MWF schedule.  Chronic combined systolic and diastolic dysfunction CHF Patient's last known LVEF is about 35 to 40% from an echocardiogram done in 2019 Stable and not acutely exacerbated Volume management per renal.  Seizure disorder Continue Keppra. Continue seizure precautions EEG:  no evidence of  seizures.  Thrombocytopenia Chronic, continue to monitor.  Peripheral arterial disease Status post bilateral BKA Continue statin, apixaban and carvedilol  Depression Continue Remeron.   DVT prophylaxis: Eliquis Code Status: Full code Family Communication: No Family in the room Disposition Plan:   Status is: Inpatient  Remains inpatient appropriate because:Inpatient level of care appropriate due to severity of illness   Dispo: The patient is from: Home              Anticipated d/c is to:  SNF              Anticipated d/c date is: 12/24              Patient currently is not medically stable to d/c.  Consultants:   Pulmonology  Nephrology  Neurology  Procedures: EEG  Antimicrobials: Anti-infectives (From admission, onward)   Start     Dose/Rate Route Frequency Ordered Stop   04/11/20 1800  valACYclovir (VALTREX) tablet 500 mg        500 mg Oral Daily 04/10/20 2043     04/11/20 1330  doxycycline (VIBRA-TABS) tablet 100 mg        100 mg Oral Every 12 hours 04/11/20 1244     04/11/20 1300  cefTRIAXone (ROCEPHIN) 2 g in sodium chloride 0.9 % 100 mL IVPB        2 g 200 mL/hr over 30 Minutes Intravenous Every 24 hours 04/11/20 1239 04/16/20 1259   04/11/20 1300  azithromycin (ZITHROMAX) 500 mg in sodium chloride 0.9 % 250 mL IVPB  Status:  Discontinued        500 mg 250 mL/hr over 60 Minutes Intravenous Every 24 hours 04/11/20 1239 04/11/20 1244   04/10/20 1500  valACYclovir (VALTREX) tablet 500 mg  Status:  Discontinued        500 mg Oral Daily 04/10/20 1446 04/10/20 2042      Subjective: Patient was seen and examined at bedside.  Overnight events noted.  Patient was lethargic overnight.   She appears more alert now.  She is going to have hemodialysis today.  She denies any complaints.  Objective: Vitals:   04/12/20 2100 04/13/20 0559 04/13/20 0757 04/13/20 1140  BP: (!) 145/73 (!) 174/78 (!) 148/69 (!) 182/73  Pulse: 67 (!) 59 63 65  Resp: 18 16 18 17    Temp: 98.3 F (36.8 C) 97.6 F (36.4 C) (!) 97.4 F (36.3 C) 97.6 F (36.4 C)  TempSrc: Oral  Oral   SpO2: 98% 97% 98% 100%  Weight:      Height:        Intake/Output Summary (Last 24 hours) at 04/13/2020 1213 Last data filed at 04/13/2020 0306 Gross per 24 hour  Intake 440.92 ml  Output --  Net 440.92 ml   Filed Weights   04/10/20 1106  Weight: 50 kg    Examination:  General exam: Appears calm and comfortable, not in any distress. Respiratory system: Clear to auscultation. Respiratory effort normal. Cardiovascular system: S1 & S2 heard, RRR. No JVD, murmurs, rubs, gallops or clicks. No pedal edema. Gastrointestinal system: Abdomen is nondistended, soft and nontender. No organomegaly or masses felt. Normal bowel sounds heard. Central nervous system: Alert and oriented x 2. No focal neurological deficits. Extremities: Bilateral BKA. Skin: No rashes, lesions or ulcers. Psychiatry: Judgement and insight appear normal. Mood & affect appropriate.     Data Reviewed: I have personally reviewed following labs and imaging studies  CBC: Recent Labs  Lab 04/10/20  1127 04/11/20 0403 04/12/20 0553 04/13/20 0405  WBC 4.2 3.9* 4.8 4.4  NEUTROABS 3.0  --  3.5  --   HGB 13.4 12.1 11.3* 11.5*  HCT 43.2 39.6 36.3 38.0  MCV 96.4 98.3 96.3 96.9  PLT 113* 93* 97* 95*   Basic Metabolic Panel: Recent Labs  Lab 04/10/20 1127 04/10/20 1129 04/11/20 0403 04/12/20 0553 04/13/20 0405  NA 140  --  139 138 136  K 4.8  --  4.6 3.7 4.1  CL 97*  --  99 99 101  CO2 28  --  24 26 25   GLUCOSE 186*  --  121* 79 73  BUN 53*  --  57* 25* 37*  CREATININE 9.33*  --  10.31* 6.32* 8.01*  CALCIUM 8.9  --  8.7* 8.6* 8.1*  MG  --  2.2  --  1.9 2.0  PHOS  --   --  4.2 3.3 4.1   GFR: Estimated Creatinine Clearance: 3.8 mL/min (A) (by C-G formula based on SCr of 8.01 mg/dL (H)). Liver Function Tests: Recent Labs  Lab 04/10/20 1127 04/12/20 0553  AST 17 17  ALT 13 10  ALKPHOS 92 77   BILITOT 0.6 0.6  PROT 7.4 6.6  ALBUMIN 3.7 3.2*   No results for input(s): LIPASE, AMYLASE in the last 168 hours. Recent Labs  Lab 04/10/20 1129  AMMONIA 19   Coagulation Profile: No results for input(s): INR, PROTIME in the last 168 hours. Cardiac Enzymes: No results for input(s): CKTOTAL, CKMB, CKMBINDEX, TROPONINI in the last 168 hours. BNP (last 3 results) No results for input(s): PROBNP in the last 8760 hours. HbA1C: No results for input(s): HGBA1C in the last 72 hours. CBG: Recent Labs  Lab 04/12/20 0739 04/12/20 1618 04/12/20 2116 04/13/20 0724 04/13/20 0958  GLUCAP 86 98 88 77 75   Lipid Profile: No results for input(s): CHOL, HDL, LDLCALC, TRIG, CHOLHDL, LDLDIRECT in the last 72 hours. Thyroid Function Tests: Recent Labs    04/11/20 1842  TSH 11.411*   Anemia Panel: Recent Labs    04/11/20 1842  VITAMINB12 2,909*   Sepsis Labs: Recent Labs  Lab 04/12/20 0553  PROCALCITON 0.37    Recent Results (from the past 240 hour(s))  MRSA PCR Screening     Status: None   Collection Time: 04/04/20  6:00 AM   Specimen: Nasopharyngeal  Result Value Ref Range Status   MRSA by PCR NEGATIVE NEGATIVE Final    Comment:        The GeneXpert MRSA Assay (FDA approved for NASAL specimens only), is one component of a comprehensive MRSA colonization surveillance program. It is not intended to diagnose MRSA infection nor to guide or monitor treatment for MRSA infections. Performed at [facility], [address] [place], [place] [address]   Resp Panel by RT-PCR (Flu A&B, Covid) Nasopharyngeal Swab     Status: None   Collection Time: 04/10/20 12:58 PM   Specimen: Nasopharyngeal Swab; Nasopharyngeal(NP) swabs in vial transport medium  Result Value Ref Range Status   SARS Coronavirus 2 by RT PCR NEGATIVE NEGATIVE Final    Comment: (NOTE) SARS-CoV-2 target nucleic acids are NOT DETECTED.  The SARS-CoV-2 RNA is generally detectable in upper  respiratory specimens during the acute phase of infection. The lowest concentration of SARS-CoV-2 viral copies this assay can detect is 138 copies/mL. A negative result does not preclude SARS-Cov-2 infection and should not be used as the sole basis for treatment or other patient management decisions. A  negative result may occur with  improper specimen collection/handling, submission of specimen other than nasopharyngeal swab, presence of viral mutation(s) within the areas targeted by this assay, and inadequate number of viral copies(<138 copies/mL). A negative result must be combined with clinical observations, patient history, and epidemiological information. The expected result is Negative.  Fact Sheet for Patients:  EntrepreneurPulse.com.au  Fact Sheet for Healthcare Providers:  IncredibleEmployment.be  This test is no t yet approved or cleared by the [place] FDA and  has been authorized for detection and/or diagnosis of SARS-CoV-2 by FDA under an Emergency Use Authorization (EUA). This EUA will remain  in effect (meaning this test can be used) for the duration of the COVID-19 declaration under Section 564(b)(1) of the Act, 21 U.S.C.section 360bbb-3(b)(1), unless the authorization is terminated  or revoked sooner.       Influenza A by PCR NEGATIVE NEGATIVE Final   Influenza B by PCR NEGATIVE NEGATIVE Final    Comment: (NOTE) The Xpert Xpress SARS-CoV-2/FLU/RSV plus assay is intended as an aid in the diagnosis of influenza from Nasopharyngeal swab specimens and should not be used as a sole basis for treatment. Nasal washings and aspirates are unacceptable for Xpert Xpress SARS-CoV-2/FLU/RSV testing.  Fact Sheet for Patients: EntrepreneurPulse.com.au  Fact Sheet for Healthcare Providers: IncredibleEmployment.be  This test is not yet approved or cleared by the [place] FDA and has been  authorized for detection and/or diagnosis of SARS-CoV-2 by FDA under an Emergency Use Authorization (EUA). This EUA will remain in effect (meaning this test can be used) for the duration of the COVID-19 declaration under Section 564(b)(1) of the Act, 21 U.S.C. section 360bbb-3(b)(1), unless the authorization is terminated or revoked.  Performed at [facility], [address] [place], [place] [address]   CULTURE, BLOOD (ROUTINE X 2) w Reflex to ID Panel     Status: None (Preliminary result)   Collection Time: 04/11/20  6:42 PM   Specimen: BLOOD  Result Value Ref Range Status   Specimen Description BLOOD BLOOD LEFT HAND  Final   Special Requests   Final    BOTTLES DRAWN AEROBIC AND ANAEROBIC Blood Culture results may not be optimal due to an inadequate volume of blood received in culture bottles   Culture   Final    NO GROWTH 2 DAYS Performed at [facility], [address] [place], [place] [address]    Report Status PENDING  Incomplete  CULTURE, BLOOD (ROUTINE X 2) w Reflex to ID Panel     Status: None (Preliminary result)   Collection Time: 04/11/20  6:50 PM   Specimen: BLOOD  Result Value Ref Range Status   Specimen Description BLOOD BLOOD LEFT FOREARM  Final   Special Requests   Final    BOTTLES DRAWN AEROBIC AND ANAEROBIC Blood Culture adequate volume   Culture   Final    NO GROWTH 2 DAYS Performed at [facility], [address] [place], [place] [address]    Report Status PENDING  Incomplete         Radiology Studies: EEG  Result Date: 04/12/2020 Lora Havens, MD     04/12/2020 11:37 AM Patient Name: Stephanie Ellis MRN: [id number] Epilepsy Attending: Lora Havens Referring Physician/Provider: Dr. Fayrene Helper. Date: 04/11/2020 Duration: 21.55 minutes Patient history: 69 year old female with history of epilepsy who presented with altered mental status.  EEG evaluate for seizures. Level of alertness: Awake, asleep AEDs during  EEG study: Keppra. Technical aspects: This EEG study was  done with scalp electrodes positioned according to the 10-20 International system of electrode placement. Electrical activity was acquired at a sampling rate of 500Hz  and reviewed with a high frequency filter of 70Hz  and a low frequency filter of 1Hz . EEG data were recorded continuously and digitally stored. Description: During awake state, no clear posterior dominant rhythm was seen.  Sleep was characterized by vertex waves, sleep spindles (12 to 14 Hz), maximal frontocentral region.  EEG showed continuous generalized 3 to 5 Hz theta-delta slowing.  Intermittent rhythmic generalized delta activity was also noted.  Sharp transients were seen in bilateral posterior quadrant.  Hyperventilation and photic stimulation were not performed.   ABNORMALITY -Continuous slow, generalized -Intermittent rhythmic delta activity, generalized IMPRESSION: This study is suggestive of moderate diffuse encephalopathy, nonspecific etiology.  No seizures or definite epileptiform discharges were seen throughout the recording. Priyanka Barbra Sarks   Scheduled Meds: . apixaban  2.5 mg Oral BID  . aspirin EC  81 mg Oral Daily  . atorvastatin  80 mg Oral QHS  . brimonidine  1 drop Both Eyes TID  . carvedilol  37.5 mg Oral BID WC  . Chlorhexidine Gluconate Cloth  6 each Topical Q0600  . doxycycline  100 mg Oral Q12H  . fluticasone  2 spray Each Nare Daily  . hydrALAZINE  50 mg Oral Q8H  . insulin aspart  0-6 Units Subcutaneous TID WC  . ipratropium  2 spray Each Nare BID  . isosorbide mononitrate  60 mg Oral Daily  . levETIRAcetam  250 mg Oral Q M,W,F-HD  . levETIRAcetam  500 mg Oral BID  . mirtazapine  7.5 mg Oral QHS  . omega-3 acid ethyl esters  2 g Oral Daily  . pantoprazole  40 mg Oral Daily  . rOPINIRole  0.25 mg Oral QHS  . sevelamer carbonate  1,600 mg Oral TID WC  . sodium chloride flush  3 mL Intravenous Q12H  . sodium chloride flush  3 mL Intravenous Q12H   . [START ON 04/20/2020] thiamine injection  100 mg Intravenous Daily  . timolol  1 drop Both Eyes TID  . triamcinolone   Topical BID  . valACYclovir  500 mg Oral Daily  . vitamin B-12  1,000 mcg Oral Daily   Continuous Infusions: . sodium chloride    . cefTRIAXone (ROCEPHIN)  IV Stopped (04/13/20 0307)  . thiamine injection 500 mg (04/13/20 5170)   Followed by  . [START ON 04/15/2020] thiamine injection       LOS: 3 days    Time spent: 25 mins.    Shawna Clamp, MD [facility]   If 7PM-7AM, please contact night-coverage

## 2020-04-13 NOTE — Care Management Important Message (Signed)
Important Message  Patient Details  Name: Stephanie Ellis MRN: [id number] Date of Birth: Jan 26, 1951   Medicare Important Message Given:  N/A - LOS <3 / Initial given by admissions     Juliann Pulse A Azizah Lisle 04/13/2020, 8:00 AM

## 2020-04-13 NOTE — Progress Notes (Signed)
Patient to dialysis in stable condition

## 2020-04-13 NOTE — Progress Notes (Addendum)
Pulmonary Medicine          Date: 04/13/2020,   MRN# [id number] Stephanie Ellis 03/21/1951     AdmissionWeight: 50 kg                 CurrentWeight: 50 kg   Referring physician Dr. Dwyane Dee   CHIEF COMPLAINT:   Loculated pleural effusion   HISTORY OF PRESENT ILLNESS    Stephanie Ellis is a 69F with PMH significant for diabetes mellitus with complications of ESRD on HD (M/W/F) , hypertension, PAD s/p bilateral BKA, coronary artery disease status post CABG who was brought to the emergency room by EMS for evaluation of mental status changes.  Patient was recently discharged from the hospital on 04/06/20.  During that hospitalization she had a brief PEA arrest after she received hydromorphone and recovered immediately following administration of Narcan.  Her daughter states that since her discharge she has not been her usual self and has been acting very erratic.  At baseline patient is able to take care of herself and thyroid activities of daily living such as transfers, grooming and feeding herself.  Since her discharge she has been very weak and has been unable to feed herself or transfer to her wheelchair.  Family is also concerned that her speech is slurred.  Patient refused to go to dialysis on the morning of her admission and so EMS was called.  CT scan of the head without contrast shows no acute intracranial findings.Chronic microvascular ischemic change and cerebral volume loss.Chest x ray reviewed by me shows central catheter as described without pneumothorax. Small area of suspected pneumonia lateral right base. Lungs elsewhere clear. There is cardiomegaly with postoperative coronary artery bypass grafting change. Aortic Atherosclerosis . Pulmonary consultation placed for possible empyma vs parapneumonic effusion    04/13/20- patient is in deep sleep during my evaluation this evening. Sitter states she only said 2 words all day and did not eat meal. Vitals are with accelerated  HTN but oxygenating well on room air.    PAST MEDICAL HISTORY   Past Medical History:  Diagnosis Date  . (HFpEF) heart failure with preserved ejection fraction ([place])    a. 01/2018 Echo: EF 50-55%, no rwma, Gr1 DD, triv AI, mild MR. Midly dil LA/RV, mod reduced RV fxn. Irreg thickening of TV w/ mobile echodensity that appears to arise from valve.  . Anemia   . Anorexia   . Bacteremia due to Pseudomonas   . Carotid arterial disease ([place])    a. 01/2017 Carotid U/S: RICA <25, LICA <95.  Marland Kitchen CHF (congestive heart failure) ([place])   . Complication of anesthesia    a. Pt reports h/o complication on 9 different occasions - ? hypotension/arrest.  . 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 01/2015 Cath [facility]): 70% LM, p-mLAD 50-60% (Resting FFR 0.75), mRCA 80-90%, ~40 Ost OM & D1-->CABG; b. 01/2016 Staged PCI of LCX x 2 and RCA.  Marland Kitchen ESRD (end stage renal disease) on dialysis ([place])    a. ESRD secondary to acute kidney failure s/p CABG-->PD  . Essential hypertension   . GERD (gastroesophageal reflux disease)   . Heart murmur   . Hypercholesterolemia   . Myocardial infarction ([place]) 2016  . PVD (peripheral vascular disease) ([place])    a. 01/30/2018 PV Angio: Sev Left Popliteal dzs s/p PTA w/ 60m lutonix DEB w/ mech aspiration of the L popliteal, L AT, and Left tibioperoneal trunck and peroneal artery.  .Marland Kitchen  S/P CABG x 3 03/24/2015   a.  [facility]: Dr. Marland Kitchen Ha[place]cock: CABG x 3, LIMA to LAD, SVG to RCA, SVG to OM3, EVH  . Sinusitis 2019  . Type II diabetes mellitus with complication (HCC)    CAD     SURGICAL HISTORY   Past Surgical History:  Procedure Laterality Date  . ABDOMINAL HYSTERECTOMY    . AMPUTATION Right 02/25/2018   Procedure: AMPUTATION BELOW KNEE;  Surgeon: Katha Cabal, MD;  Location: [facility];  Service: Vascular;  Laterality: Right;  . AMPUTATION Left 03/11/2018   Procedure: AMPUTATION BELOW KNEE;  Surgeon: Algernon Huxley, MD;  Location: [facility];   Service: Vascular;  Laterality: Left;  . AMPUTATION Bilateral 04/13/2018   Procedure: REVISION OF BILATERAL AMPUTATIONS BELOW KNEE WITH WOUND VAC APPLICATIONS;  Surgeon: Evaristo Bury, MD;  Location: [facility];  Service: Vascular;  Laterality: Bilateral;  . AMPUTATION TOE Left 02/06/2018   Procedure: AMPUTATION GREAT TOE;  Surgeon: Samara Deist, DPM;  Location: [facility];  Service: Podiatry;  Laterality: Left;  . APPLICATION OF WOUND VAC Left 04/13/2018   Procedure: APPLICATION OF WOUND VAC;  Surgeon: Evaristo Bury, MD;  Location: [facility];  Service: Vascular;  Laterality: Left;  . ARTERIOVENOUS GRAFT PLACEMENT  05/2016  . AV FISTULA PLACEMENT Left 05/30/2016   Procedure: ARTERIOVENOUS graft;  Surgeon: Algernon Huxley, MD;  Location: [facility];  Service: Vascular;  Laterality: Left;  . CAPD INSERTION N/A 10/30/2017   Procedure: LAPAROSCOPIC INSERTION CONTINUOUS AMBULATORY PERITONEAL DIALYSIS  (CAPD) CATHETER;  Surgeon: Algernon Huxley, MD;  Location: [facility];  Service: Vascular;  Laterality: N/A;  . CARDIAC CATHETERIZATION  01/2015   [facility]: Ost LM 70%, p-m LAD 50-60% (Rest FFR + @ 0.75), mRCA 80-90%, ostD1 40%, pOM1 40%  . CATARACT EXTRACTION W/PHACO Left 01/18/2016   Procedure: CATARACT EXTRACTION PHACO AND INTRAOCULAR LENS PLACEMENT (IOC);  Surgeon: Eulogio Bear, MD;  Location: [facility];  Service: Ophthalmology;  Laterality: Left;  [place] 1.05 AP% 15.5 CDE 10.16 Fluid Pack Lot # [id number]  . CATARACT EXTRACTION W/PHACO Right 08/01/2016   Procedure: CATARACT EXTRACTION PHACO AND INTRAOCULAR LENS PLACEMENT (IOC);  Surgeon: Eulogio Bear, MD;  Location: [facility];  Service: Ophthalmology;  Laterality: Right;  [place] 01:00.6 AP% 11.4 CDE 6.93  LOT # [id number]  . COLONOSCOPY    . CORONARY ANGIOPLASTY     SENTS 02/12/16  . CORONARY ARTERY BYPASS GRAFT  03/28/15    [facility]: Dr. Waldemar Dickens: LIMA to LAD, SVG to RCA, SVG to OM3, EVH  . DIALYSIS/PERMA CATHETER INSERTION N/A 02/11/2018   Procedure:  DIALYSIS/PERMA CATHETER INSERTION;  Surgeon: Algernon Huxley, MD;  Location: [place] [facility];  Service: Cardiovascular;  Laterality: N/A;  . DIALYSIS/PERMA CATHETER REMOVAL Right 02/04/2018   Procedure: DIALYSIS/PERMA CATHETER REMOVAL;  Surgeon: Katha Cabal, MD;  Location: [employer] [facility];  Service: Cardiovascular;  Laterality: Right;  . ESOPHAGOGASTRODUODENOSCOPY (EGD) WITH PROPOFOL N/A 10/24/2016   Procedure: ESOPHAGOGASTRODUODENOSCOPY (EGD) WITH PROPOFOL;  Surgeon: [name], MD;  Location: [facility];  Service: Endoscopy;  Laterality: N/A;  . EYE SURGERY Bilateral    cataract surgery  . EYE SURGERY     drains for glaucoma  . INSERTION EXPRESS TUBE SHUNT Right 08/01/2016   Procedure: INSERTION EXPRESS TUBE SHUNT;  Surgeon: Eulogio Bear, MD;  Location: [facility];  Service: Ophthalmology;  Laterality: Right;  . INSERTION OF AHMED VALVE Left 08/15/2016   Procedure: INSERTION OF AHMED VALVE;  Surgeon: Josie Saunders  Edison Pace, MD;  Location: [facility];  Service: Ophthalmology;  Laterality: Left;  . INSERTION OF DIALYSIS CATHETER    . LOWER EXTREMITY ANGIOGRAPHY Right 02/19/2018   Procedure: Lower Extremity Angiography;  Surgeon: Algernon Huxley, MD;  Location: [place] [facility];  Service: Cardiovascular;  Laterality: Right;  . LOWER EXTREMITY ANGIOGRAPHY Left 01/30/2018   Procedure: Lower Extremity Angiography;  Surgeon: Katha Cabal, MD;  Location: [place] [facility];  Service: Cardiovascular;  Laterality: Left;  . REMOVAL OF A DIALYSIS CATHETER N/A 02/25/2018   Procedure: REMOVAL OF A DIALYSIS CATHETER;  Surgeon: Katha Cabal, MD;  Location: [facility];  Service: Vascular;  Laterality: N/A;  . TEMPORARY DIALYSIS CATHETER N/A 02/06/2018   Procedure: TEMPORARY DIALYSIS CATHETER;  Surgeon: Katha Cabal, MD;  Location: [place] [facility];  Service: Cardiovascular;  Laterality: N/A;  . TRANSTHORACIC ECHOCARDIOGRAM  January 2017    EF 60-65%. GR 2 DD. Mild  degenerative mitral valve disease but no prolapse or regurgitation. Mild left atrial dilation. Mild to moderate LVH. Pericardial effusion gone  . UPPER EXTREMITY ANGIOGRAPHY Left 06/27/2016   Procedure: Upper Extremity Angiography;  Surgeon: Algernon Huxley, MD;  Location: [place] [facility];  Service: Cardiovascular;  Laterality: Left;  . UPPER EXTREMITY ANGIOGRAPHY Left 08/22/2016   Procedure: Upper Extremity Angiography;  Surgeon: Algernon Huxley, MD;  Location: [place] [facility];  Service: Cardiovascular;  Laterality: Left;  Marland Kitchen VASCULAR SURGERY    . WOUND DEBRIDEMENT Left 05/08/2018   Procedure: DEBRIDEMENT OF LEFT LEG BKA;  Surgeon: Algernon Huxley, MD;  Location: [facility];  Service: General;  Laterality: Left;     FAMILY HISTORY   Family History  Problem Relation Age of Onset  . Diabetes Mellitus II Mother   . Heart failure Mother   . Pancreatic cancer Father      SOCIAL HISTORY   Social History   Tobacco Use  . Smoking status: Never Smoker  . Smokeless tobacco: Never Used  Vaping Use  . Vaping Use: Never used  Substance Use Topics  . Alcohol use: No  . Drug use: No     MEDICATIONS    Home Medication:    Current Medication:  Current Facility-Administered Medications:  .  0.9 %  sodium chloride infusion, 250 mL, Intravenous, PRN, Agbata, Tochukwu, MD .  apixaban (ELIQUIS) tablet 2.5 mg, 2.5 mg, Oral, BID, Benita Gutter, RPH, 2.5 mg at 04/12/20 2233 .  aspirin EC tablet 81 mg, 81 mg, Oral, Daily, Benita Gutter, RPH, 81 mg at 04/12/20 0854 .  atorvastatin (LIPITOR) tablet 80 mg, 80 mg, Oral, QHS, Agbata, Tochukwu, MD, 80 mg at 04/12/20 2232 .  brimonidine (ALPHAGAN) 0.2 % ophthalmic solution 1 drop, 1 drop, Both Eyes, TID, Benita Gutter, RPH, 1 drop at 04/13/20 1637 .  carvedilol (COREG) tablet 37.5 mg, 37.5 mg, Oral, BID WC, Dorothe Pea, RPH, 37.5 mg at 04/12/20 1703 .  cefTRIAXone (ROCEPHIN) 2 g in sodium chloride 0.9 % 100 mL IVPB, 2 g, Intravenous, Q24H,  Elodia Florence., MD, Stopped at 04/13/20 [phone]  Chlorhexidine Gluconate Cloth 2 % PADS 6 each, 6 each, Topical, Q0600, Murlean Iba, MD, 6 each at 04/13/20 0559 .  doxycycline (VIBRA-TABS) tablet 100 mg, 100 mg, Oral, Q12H, Elodia Florence., MD, 100 mg at 04/12/20 2232 .  fluticasone (FLONASE) 50 MCG/ACT nasal spray 2 spray, 2 spray, Each Nare, Daily, Benita Gutter, RPH, 2 spray at 04/12/20 0859 .  hydrALAZINE (APRESOLINE) injection 5 mg, 5 mg, Intravenous, Q6H PRN, Elodia Florence., MD, 5 mg at 04/13/20 1635 .  hydrALAZINE (APRESOLINE) tablet 75 mg, 75 mg, Oral, Q8H, Kumar, Pardeep, MD .  insulin aspart (novoLOG) injection 0-6 Units, 0-6 Units, Subcutaneous, TID WC, Agbata, Tochukwu, MD .  ipratropium (ATROVENT) 0.06 % nasal spray 2 spray, 2 spray, Each Nare, BID, Benita Gutter, RPH, 2 spray at 04/12/20 2231 .  [COMPLETED] isosorbide mononitrate (IMDUR) 24 hr tablet 30 mg, 30 mg, Oral, Once, 30 mg at 04/12/20 1433 **FOLLOWED BY** isosorbide mononitrate (IMDUR) 24 hr tablet 60 mg, 60 mg, Oral, Daily, Shawna Clamp, MD .  levETIRAcetam (KEPPRA) IVPB 500 mg/100 mL premix, 500 mg, Intravenous, BID, Shawna Clamp, MD, Last Rate: 400 mL/hr at 04/13/20 1631, 500 mg at 04/13/20 1631 .  levETIRAcetam (KEPPRA) tablet 250 mg, 250 mg, Oral, Q M,W,F-HD, Benita Gutter, RPH .  meclizine (ANTIVERT) tablet 25 mg, 25 mg, Oral, Q4H PRN, Agbata, Tochukwu, MD .  mirtazapine (REMERON) tablet 7.5 mg, 7.5 mg, Oral, QHS, Agbata, Tochukwu, MD, 7.5 mg at 04/12/20 2233 .  omega-3 acid Stephanie esters (LOVAZA) capsule 2 g, 2 g, Oral, Daily, Benita Gutter, RPH, 2 g at 04/12/20 0856 .  ondansetron (ZOFRAN) tablet 4 mg, 4 mg, Oral, Q6H PRN **OR** ondansetron (ZOFRAN) injection 4 mg, 4 mg, Intravenous, Q6H PRN, Agbata, Tochukwu, MD .  pantoprazole (PROTONIX) EC tablet 40 mg, 40 mg, Oral, Daily, Agbata, Tochukwu, MD, 40 mg at 04/12/20 0855 .  polyvinyl alcohol (LIQUIFILM TEARS) 1.4 % ophthalmic  solution 1 drop, 1 drop, Both Eyes, PRN, Benita Gutter, RPH, 1 drop at 04/10/20 2143 .  rOPINIRole (REQUIP) tablet 0.25 mg, 0.25 mg, Oral, QHS, Agbata, Tochukwu, MD, 0.25 mg at 04/12/20 2314 .  sevelamer carbonate (RENVELA) tablet 1,600 mg, 1,600 mg, Oral, TID WC, Benita Gutter, RPH, 1,600 mg at 04/12/20 1703 .  sodium chloride flush (NS) 0.9 % injection 3 mL, 3 mL, Intravenous, Q12H, Agbata, Tochukwu, MD, 3 mL at 04/11/20 2232 .  sodium chloride flush (NS) 0.9 % injection 3 mL, 3 mL, Intravenous, Q12H, Agbata, Tochukwu, MD, 3 mL at 04/12/20 2223 .  sodium chloride flush (NS) 0.9 % injection 3 mL, 3 mL, Intravenous, PRN, Agbata, Tochukwu, MD .  thiamine 598m in normal saline (564m IVPB, 500 mg, Intravenous, Q8H, Last Rate: 100 mL/hr at 04/13/20 1638, 500 mg at 04/13/20 1638 **FOLLOWED BY** [START ON 04/15/2020] thiamine (B-1) 250 mg in sodium chloride 0.9 % 50 mL IVPB, 250 mg, Intravenous, Daily **FOLLOWED BY** [START ON 04/20/2020] thiamine (B-1) injection 100 mg, 100 mg, Intravenous, Daily, PoFlorene GlenA CaClint Lipps MD .  timolol (TIMOPTIC) 0.5 % ophthalmic solution 1 drop, 1 drop, Both Eyes, TID, ChBenita GutterRPH, 1 drop at 04/13/20 1635 .  triamcinolone (KENALOG) 0.1 % cream, , Topical, BID, ChBenita GutterPSterlington Rehabilitation HospitalGiven at 04/12/20 2246 .  valACYclovir (VALTREX) tablet 500 mg, 500 mg, Oral, Daily, ChBenita GutterRPH, 500 mg at 04/12/20 1703 .  vitamin B-12 (CYANOCOBALAMIN) tablet 1,000 mcg, 1,000 mcg, Oral, Daily, ChBenita GutterRPH, 1,000 mcg at 04/12/20 [id number]  ALLERGIES   Chlorthalidone, Dilaudid [hydromorphone hcl], Fentanyl, Midazolam, Morphine and related, Ace inhibitors, Angiotensin receptor blockers, Metoclopramide, Norvasc [amlodipine], and Phenergan [promethazine hcl]     REVIEW OF SYSTEMS    Review of Systems:  Gen:  Denies  fever, sweats, chills weigh loss  HEENT: Denies blurred vision, double vision, ear pain, eye pain, hearing  loss, nose bleeds, sore  throat Cardiac:  No dizziness, chest pain or heaviness, chest tightness,edema Resp:   Denies cough or sputum porduction, shortness of breath,wheezing, hemoptysis,  Gi: Denies swallowing difficulty, stomach pain, nausea or vomiting, diarrhea, constipation, bowel incontinence Gu:  Denies bladder incontinence, burning urine Ext:   Denies Joint pain, stiffness or swelling Skin: Denies  skin rash, easy bruising or bleeding or hives Endoc:  Denies polyuria, polydipsia , polyphagia or weight change Psych:   Denies depression, insomnia or hallucinations   Other:  All other systems negative   VS: BP (!) 177/77 (BP Location: Right Arm)   Pulse 66   Temp 97.9 F (36.6 C) (Axillary)   Resp 18   Ht 4' 4"  (1.321 m)   Wt 50 kg   SpO2 97%   BMI 28.66 kg/m      PHYSICAL EXAM    GENERAL:NAD, poorly responsive HEAD: Normocephalic, atraumatic.  EYES: Pupils equal, round, reactive to light. Extraocular muscles intact. No scleral icterus.  MOUTH: Moist mucosal membrane. Dentition intact. No abscess noted.  EAR, NOSE, THROAT: Clear without exudates. No external lesions.  NECK: Supple. No thyromegaly. No nodules. No JVD.  PULMONARY: decreased bs bilaterally  CARDIOVASCULAR: S1 and S2. Regular rate and rhythm. No murmurs, rubs, or gallops. No edema. Pedal pulses 2+ bilaterally.  GASTROINTESTINAL: Soft, nontender, nondistended. No masses. Positive bowel sounds. No hepatosplenomegaly.  MUSCULOSKELETAL: No swelling, clubbing, or edema. Range of motion full in all extremities.  NEUROLOGIC:GCS 9 SKIN: No ulceration, lesions, rashes, or cyanosis. Skin warm and dry. Turgor intact.  PSYCHIATRIC: Mood, affect within normal limits. The patient is awake, alert and oriented x 3. Insight, judgment intact.       IMAGING    EEG  Result Date: 04/12/2020 Lora Havens, MD     04/12/2020 11:37 AM Patient Name: Page Pucciarelli MRN: [id number] Epilepsy Attending: Lora Havens Referring  Physician/Provider: Dr. Fayrene Helper. Date: 04/11/2020 Duration: 21.55 minutes Patient history: 69 year old female with history of epilepsy who presented with altered mental status.  EEG evaluate for seizures. Level of alertness: Awake, asleep AEDs during EEG study: Keppra. Technical aspects: This EEG study was done with scalp electrodes positioned according to the 10-20 International system of electrode placement. Electrical activity was acquired at a sampling rate of 500Hz  and reviewed with a high frequency filter of 70Hz  and a low frequency filter of 1Hz . EEG data were recorded continuously and digitally stored. Description: During awake state, no clear posterior dominant rhythm was seen.  Sleep was characterized by vertex waves, sleep spindles (12 to 14 Hz), maximal frontocentral region.  EEG showed continuous generalized 3 to 5 Hz theta-delta slowing.  Intermittent rhythmic generalized delta activity was also noted.  Sharp transients were seen in bilateral posterior quadrant.  Hyperventilation and photic stimulation were not performed.   ABNORMALITY -Continuous slow, generalized -Intermittent rhythmic delta activity, generalized IMPRESSION: This study is suggestive of moderate diffuse encephalopathy, nonspecific etiology.  No seizures or definite epileptiform discharges were seen throughout the recording. Priyanka Barbra Sarks   CT ABDOMEN PELVIS WO CONTRAST  Result Date: 04/02/2020 CLINICAL DATA:  Left upper quadrant abdominal pain EXAM: CT ABDOMEN AND PELVIS WITHOUT CONTRAST TECHNIQUE: Multidetector CT imaging of the abdomen and pelvis was performed following the standard protocol without IV contrast. COMPARISON:  CT 05/01/2019 FINDINGS: Lower chest: Atelectatic changes are present in the lung bases including more bandlike areas of subsegmental atelectasis. Some interlobular septal thickening is noted as well which could reflect early edema. Cardiomegaly appearance.  Tip of a dual lumen dialysis catheter is  noted. Post sternotomy changes. Coronary artery calcifications. Hepatobiliary: No visible liver lesion on this unenhanced CT. Liver attenuation is within normal limits. Scattered calcifications in the liver are favored to be vascular in nature. Gallbladder wall appears thickened with some pericholecystic fluid though may be redistributed. Layering calcified gallstones and probable sludge present within the lumen. Pancreas: No pancreatic ductal dilatation or surrounding inflammatory changes. Spleen: Normal in size. No concerning splenic lesions. Linear branching calcifications are present. Adrenals/Urinary Tract: Normal adrenal glands. Kidneys are not symmetric in size and normally located. Extensive vascular calcifications in the renal hila. Few subcentimeter hypoattenuating foci too small to fully characterize on CT imaging but statistically likely benign. No visible or contour deforming renal lesions. No urolithiasis or hydronephrosis is evident. Urinary bladder is largely decompressed at the time of exam and therefore poorly evaluated by CT imaging. Circumferential bladder wall thickening is nonspecific given underdistention. Stomach/Bowel: Distal esophagus, stomach and duodenum are unremarkable. No conspicuous small bowel thickening or dilatation. Portion of small bowel protrudes into a small infraumbilical ventral hernia anteriorly albeit without convincing evidence of mechanical obstruction or vascular compromise at this time. There is some diffuse mild pancolonic mural thickening. Scattered colonic diverticula without focal inflammation to suggest diverticulitis. Vascular/Lymphatic: Extensive atherosclerotic calcification throughout the abdominal aorta and branch vessels. No aneurysm or ectasia. Limited evaluation the absence of contrast. No suspicious or enlarged lymph nodes in the included lymphatic chains. Reproductive: Uterus is surgically absent. No concerning adnexal lesions. Other: Small volume simple  attenuation ascites. No free air. Diffuse body wall edema. Fat and bowel containing infraumbilical ventral hernia as above. No other bowel containing hernia. No retroperitoneal or body wall hematoma. Musculoskeletal: Remote fracture deformity of the left inferior pubic ramus. No acute osseous abnormality or suspicious osseous lesion. Musculature is unremarkable. IMPRESSION: 1. Cholelithiasis with gallbladder wall thickening and pericholecystic fluid though may be redistributed. If there is clinical concern for acute cholecystitis, consider further evaluation with right upper quadrant ultrasound. 2. Mild diffuse pancolonic mural thickening, nonspecific, but can be seen with colitis or possibly related to the abdominal ascites. 3. Fat and small bowel containing infraumbilical ventral hernia anteriorly albeit without convincing evidence of mechanical obstruction or vascular compromise at this time. 4. Circumferential bladder wall thickening is nonspecific given underdistention. Recommend correlation with urinalysis to exclude cystitis. 5. Small volume simple attenuation ascites. Diffuse body wall edema. Correlate for features of anasarca. 6. Cardiomegaly with features of edema and basilar atelectasis. 7. Aortic Atherosclerosis (ICD10-I70.0). Electronically Signed   By: Lovena Le M.D.   On: 04/02/2020 15:19   DG Chest 2 View  Result Date: 04/10/2020 CLINICAL DATA:  Altered mental status EXAM: CHEST - 2 VIEW COMPARISON:  April 02, 2020 FINDINGS: Central catheter tip is in the right atrium, slightly distal to the cavoatrial junction. No pneumothorax. There is a small area of ill-defined opacity in the lateral right base. Lungs elsewhere are clear. There is cardiomegaly with pulmonary vascular normal. Patient is status post coronary artery bypass grafting. There is aortic atherosclerosis. No bone lesions. IMPRESSION: Central catheter as described without pneumothorax. Small area of suspected pneumonia lateral  right base. Lungs elsewhere clear. There is cardiomegaly with postoperative coronary artery bypass grafting change. Aortic Atherosclerosis (ICD10-I70.0). Electronically Signed   By: Lowella Grip III M.D.   On: 04/10/2020 12:19   CT Head Wo Contrast  Result Date: 04/10/2020 CLINICAL DATA:  Altered mental status EXAM: CT HEAD WITHOUT CONTRAST TECHNIQUE: Contiguous axial images  were obtained from the base of the skull through the vertex without intravenous contrast. COMPARISON:  05/01/2019 FINDINGS: Brain: No evidence of acute infarction, hemorrhage, hydrocephalus, extra-axial collection or mass lesion/mass effect. Scattered low-density changes within the periventricular and subcortical white matter compatible with chronic microvascular ischemic change. Mild diffuse cerebral volume loss. Vascular: Atherosclerotic calcifications involving the large vessels of the skull base. No unexpected hyperdense vessel. Skull: Normal. Negative for fracture or focal lesion. Sinuses/Orbits: Chronic mucosal thickening within the left sphenoid sinus and posterior left ethmoid air cells. Remaining paranasal sinuses and mastoid air cells are clear. Other: None. IMPRESSION: 1. No acute intracranial findings. 2. Chronic microvascular ischemic change and cerebral volume loss. Electronically Signed   By: Davina Poke D.O.   On: 04/10/2020 12:29   CT CHEST WO CONTRAST  Result Date: 04/10/2020 CLINICAL DATA:  Altered mental status EXAM: CT CHEST WITHOUT CONTRAST TECHNIQUE: Multidetector CT imaging of the chest was performed following the standard protocol without IV contrast. COMPARISON:  Chest radiograph April 10, 2020; chest CT Sep 02, 2019 FINDINGS: Note that there is a degree of motion artifact. Cardiovascular: There is no thoracic aortic aneurysm. There are scattered foci of calcification in visualized great vessels. There is aortic atherosclerosis as well as multiple foci of coronary artery calcification. There is  cardiomegaly. No pericardial effusion or pericardial thickening. Central catheter tip is in the right atrium, slightly beyond the cavoatrial junction. Mediastinum/Nodes: Thyroid appears unremarkable. No appreciable thoracic adenopathy. No esophageal lesions are evident. Lungs/Pleura: There is atelectatic change in the lung bases. There is suspected loculated effusion along the lateral aspect of the right major fissure. No frank airspace consolidation. No appreciable pleural effusions. No pneumothorax. Trachea and major bronchial structures appear patent, although there is a degree of motion artifact. Upper Abdomen: There is upper abdominal aortic as well as multiple mesenteric arterial vascular calcifications. Visualized upper abdominal structures otherwise appear unremarkable. Musculoskeletal: Status post median sternotomy. No blastic or lytic bone lesions evident. No evident chest wall lesions. IMPRESSION: 1. Study somewhat less than optimal due to a degree of motion artifact. 2. Areas of atelectatic change. Mild loculated effusion along the lateral aspect of the right major fissure. No edema or consolidation. 3.  No evident adenopathy. 4. Aortic atherosclerosis as well as foci of great vessel and coronary artery calcification. There is a degree of cardiomegaly. Central catheter tip in right atrium. Aortic Atherosclerosis (ICD10-I70.0). Electronically Signed   By: Lowella Grip III M.D.   On: 04/10/2020 14:59   MR BRAIN WO CONTRAST  Result Date: 04/10/2020 CLINICAL DATA:  Mental status change EXAM: MRI HEAD WITHOUT CONTRAST TECHNIQUE: Multiplanar, multiecho pulse sequences of the brain and surrounding structures were obtained without intravenous contrast. COMPARISON:  March 2020 FINDINGS: Motion artifact is present. Brain: There is no acute infarction or intracranial hemorrhage. There is no intracranial mass, mass effect, or edema. There is no hydrocephalus or extra-axial fluid collection. Prominence of  the ventricles and sulci reflects generalized parenchymal volume loss. Patchy and confluent areas of T2 hyperintensity in the supratentorial white matter are nonspecific but probably reflect moderate chronic microvascular ischemic changes. 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 Small chronic infarcts of the cerebellum. There are a few scattered small foci of susceptibility hypointensity in the cerebral white matter likely reflecting chronic microhemorrhages. Vascular: Major vessel flow voids at the skull base are preserved. Skull and upper cervical spine: Normal marrow signal is preserved. Sinuses/Orbits: Patchy paranasal sinus mucosal thickening, including chronic left sphenoid  mucosal thickening. Bilateral lens replacements. Other: Sella is unremarkable.  Patchy mastoid fluid opacification. IMPRESSION: No acute infarction, hemorrhage, or mass. Chronic microvascular ischemic changes and few small chronic infarcts. No substantial change since 2020. Electronically Signed   By: Macy Mis M.D.   On: 04/10/2020 19:56   DG Chest Portable 1 View  Result Date: 04/02/2020 CLINICAL DATA:  Left upper quadrant pain beginning 2 days prior radiating to left chest and back, history of CHF, CAD, diabetes EXAM: PORTABLE CHEST 1 VIEW COMPARISON:  Radiograph 11/03/2019, CT 09/02/2019 FINDINGS: Left IJ approach dual lumen dialysis catheter tip projects over the right atrium. Pacer pads and telemetry leads overlie the chest. Postsurgical changes related to prior CABG including intact and aligned sternotomy wires and multiple surgical clips projecting over the mediastinum. Cardiomegaly is similar to prior portable radiography. The aorta is calcified. The remaining cardiomediastinal contours are unremarkable. Mixed hazy and patchy opacities are present throughout both lungs with indistinct, congested pulmonary vascularity, fissural and septal thickening, and small bilateral effusions. Biapical  pleuroparenchymal scarring. No pneumothorax. No acute osseous or soft tissue abnormality. Degenerative changes are present in the imaged spine and shoulders. IMPRESSION: 1. Findings compatible with CHF/volume overload with cardiomegaly and edema, effusions. 2. More coalescent airspace disease is likely to reflect alveolar edema though infection could present similarly. Electronically Signed   By: Lovena Le M.D.   On: 04/02/2020 15:11   US Abdomen Limited RUQ (LIVER/GB)  Result Date: 04/04/2020 CLINICAL DATA:  69 year old female with possible cholecystitis. EXAM: ULTRASOUND ABDOMEN LIMITED RIGHT UPPER QUADRANT COMPARISON:  CT abdomen pelvis from 04/02/2020 and right upper quadrant ultrasounds from 05/01/2019 and 09/04/2019. FINDINGS: Gallbladder: The gallbladder is relatively decompressed with thickening of the gallbladder wall measuring up to 2.5 mm, significantly decreased from 09/04/2019 comparison. Layering sludge and a few small layering gallstones are again seen. Trace pericholecystic/perihepatic free fluid. Absent sonographic Murphy sign. Common bile duct: Diameter: 3.4 mm Liver: No focal lesion identified. Within normal limits in parenchymal echogenicity and echotexture. Portal vein is patent on color Doppler imaging with normal direction of blood flow towards the liver. Other: No perihepatic ascites. IMPRESSION: No convincing sonographic signs of acute cholecystitis. Chronic gallbladder wall thickening up to 2.5 mm, decreased from May 2021 comparison, most likely secondary to congestive heart failure although chronic cholecystitis is plausible. Similar appearing layering sludge and gallstones in a nondistended gallbladder. If clinical concern for acute cholecystitis persists, recommend HIDA scan for further evaluation. Ruthann Cancer, MD [facility] Electronically Signed   By: Ruthann Cancer MD   On: 04/04/2020 08:30      ASSESSMENT/PLAN     Loculated effusion along lateral right major fissure     - reviewed CT chest as above     - effusion is minimal at this time     - there is no clinical signs of pneumonia at this time,     -will obtain respiratory culture     -procalcitonin trend  -currently empirically on doxycycline and rocephin which is adequate for CAP therapy  -I do not think there is enough fluid to have thoracentesis done nor is there evidence to suggest empyema  -recommend incenitive spirometry - educated patient on appropriate technique -patient is currently on room air speaking in full sentences in no distress   Bibasilar atelectasis   - please encourage patient to use IS every day several times each hour    Altered mental status with encephalopathy    - possible PRESS  due to uncontrolled accelerated hypertension    - possible toxic metabolic encephalopathy due to ESRD   - ABG to rule out hypercapnic related AMS    Thank you for allowing me to participate in the care of this patient.   Patient/Family are satisfied with care plan and all questions have been answered.  This document was prepared using Dragon voice recognition software and may include unintentional dictation errors.     Ottie Glazier, M.D.  Division of [place]

## 2020-04-13 NOTE — Progress Notes (Signed)
[facility]  ROUNDING NOTE   Subjective:   [name] was admitted to [facility] on 04/10/2020 for Slurred speech [R47.81] Altered mental status, unspecified altered mental status type [R41.82] AMS (altered mental status) [R41.82]    HEMODIALYSIS FLOWSHEET:  Blood Flow Rate (mL/min): 200 mL/min Arterial Pressure (mmHg): -80 mmHg Venous Pressure (mmHg): 70 mmHg Transmembrane Pressure (mmHg): 20 mmHg Ultrafiltration Rate (mL/min): 70 mL/min Dialysate Flow Rate (mL/min): 600 ml/min Conductivity: Machine : 14 Conductivity: Machine : 14 Dialysis Fluid Bolus: Normal Saline Bolus Amount (mL): 250 mL  Patient seen during dialysis.  Nursing staff reported altered mental status and that patient was not responding to her name or tactile stimuli. Heart rate jumped to 130s. Dialysis was stopped and blood was returned.  Soon after, patient was able to respond to her name and follow commands.  Heart rate in the 60s. Patient was returned to her room    Objective:  Vital signs in last 24 hours:  Temp:  [97.4 F (36.3 C)-98.3 F (36.8 C)] 97.6 F (36.4 C) (12/23 1140) Pulse Rate:  [59-67] 65 (12/23 1140) Resp:  [16-18] 17 (12/23 1140) BP: (145-182)/(67-78) 182/73 (12/23 1140) SpO2:  [97 %-100 %] 100 % (12/23 1140)  Weight change:  Filed Weights   04/10/20 1106  Weight: 50 kg    Intake/Output: I/O last 3 completed shifts: In: 680.9 [P.O.:480; IV Piggyback:200.9] Out: -    Intake/Output this shift:  No intake/output data recorded.  Physical Exam: General:  Resting in bed, in no acute distress  Head:  Oral mucous membranes moist  Eyes:  Sclerae and conjunctivae clear  Lungs:  Respirations symmetrical and unlabored, lungs clear  Heart:  S1-S2, no rubs or gallops  Abdomen:  Soft, nontender, nondistended  Extremities:  bilateral BKA, no edema noted in upper legs  Neurologic:  Able to follow commands and respond to name     Access: LIJ permcath    Basic  Metabolic Panel: Recent Labs  Lab 04/10/20 1127 04/10/20 1129 04/11/20 0403 04/12/20 0553 04/13/20 0405  NA 140  --  139 138 136  K 4.8  --  4.6 3.7 4.1  CL 97*  --  99 99 101  CO2 28  --  24 26 25   GLUCOSE 186*  --  121* 79 73  BUN 53*  --  57* 25* 37*  CREATININE 9.33*  --  10.31* 6.32* 8.01*  CALCIUM 8.9  --  8.7* 8.6* 8.1*  MG  --  2.2  --  1.9 2.0  PHOS  --   --  4.2 3.3 4.1    Liver Function Tests: Recent Labs  Lab 04/10/20 1127 04/12/20 0553  AST 17 17  ALT 13 10  ALKPHOS 92 77  BILITOT 0.6 0.6  PROT 7.4 6.6  ALBUMIN 3.7 3.2*   No results for input(s): LIPASE, AMYLASE in the last 168 hours. Recent Labs  Lab 04/10/20 1129  AMMONIA 19    CBC: Recent Labs  Lab 04/10/20 1127 04/11/20 0403 04/12/20 0553 04/13/20 0405  WBC 4.2 3.9* 4.8 4.4  NEUTROABS 3.0  --  3.5  --   HGB 13.4 12.1 11.3* 11.5*  HCT 43.2 39.6 36.3 38.0  MCV 96.4 98.3 96.3 96.9  PLT 113* 93* 97* 95*    Cardiac Enzymes: No results for input(s): CKTOTAL, CKMB, CKMBINDEX, TROPONINI in the last 168 hours.  BNP: Invalid input(s): POCBNP  CBG: Recent Labs  Lab 04/12/20 0739 04/12/20 1618 04/12/20 2116 04/13/20 0724 04/13/20 5366  GLUCAP 86 98 88 77 75    Microbiology: Results for orders placed or performed during the hospital encounter of 04/10/20  Resp Panel by RT-PCR (Flu A&B, Covid) Nasopharyngeal Swab     Status: None   Collection Time: 04/10/20 12:58 PM   Specimen: Nasopharyngeal Swab; Nasopharyngeal(NP) swabs in vial transport medium  Result Value Ref Range Status   SARS Coronavirus 2 by RT PCR NEGATIVE NEGATIVE Final    Comment: (NOTE) SARS-CoV-2 target nucleic acids are NOT DETECTED.  The SARS-CoV-2 RNA is generally detectable in upper respiratory specimens during the acute phase of infection. The lowest concentration of SARS-CoV-2 viral copies this assay can detect is 138 copies/mL. A negative result does not preclude SARS-Cov-2 infection and should not be used  as the sole basis for treatment or other patient management decisions. A negative result may occur with  improper specimen collection/handling, submission of specimen other than nasopharyngeal swab, presence of viral mutation(s) within the areas targeted by this assay, and inadequate number of viral copies(<138 copies/mL). A negative result must be combined with clinical observations, patient history, and epidemiological information. The expected result is Negative.  Fact Sheet for Patients:  EntrepreneurPulse.com.au  Fact Sheet for Healthcare Providers:  IncredibleEmployment.be  This test is no t yet approved or cleared by the [place] FDA and  has been authorized for detection and/or diagnosis of SARS-CoV-2 by FDA under an Emergency Use Authorization (EUA). This EUA will remain  in effect (meaning this test can be used) for the duration of the COVID-19 declaration under Section 564(b)(1) of the Act, 21 U.S.C.section 360bbb-3(b)(1), unless the authorization is terminated  or revoked sooner.       Influenza A by PCR NEGATIVE NEGATIVE Final   Influenza B by PCR NEGATIVE NEGATIVE Final    Comment: (NOTE) The Xpert Xpress SARS-CoV-2/FLU/RSV plus assay is intended as an aid in the diagnosis of influenza from Nasopharyngeal swab specimens and should not be used as a sole basis for treatment. Nasal washings and aspirates are unacceptable for Xpert Xpress SARS-CoV-2/FLU/RSV testing.  Fact Sheet for Patients: EntrepreneurPulse.com.au  Fact Sheet for Healthcare Providers: IncredibleEmployment.be  This test is not yet approved or cleared by the [place] FDA and has been authorized for detection and/or diagnosis of SARS-CoV-2 by FDA under an Emergency Use Authorization (EUA). This EUA will remain in effect (meaning this test can be used) for the duration of the COVID-19 declaration under Section 564(b)(1)  of the Act, 21 U.S.C. section 360bbb-3(b)(1), unless the authorization is terminated or revoked.  Performed at [facility], [address] [place], [place] [address]   CULTURE, BLOOD (ROUTINE X 2) w Reflex to ID Panel     Status: None (Preliminary result)   Collection Time: 04/11/20  6:42 PM   Specimen: BLOOD  Result Value Ref Range Status   Specimen Description BLOOD BLOOD LEFT HAND  Final   Special Requests   Final    BOTTLES DRAWN AEROBIC AND ANAEROBIC Blood Culture results may not be optimal due to an inadequate volume of blood received in culture bottles   Culture   Final    NO GROWTH 2 DAYS Performed at [facility], [address] [place], [place] [address]    Report Status PENDING  Incomplete  CULTURE, BLOOD (ROUTINE X 2) w Reflex to ID Panel     Status: None (Preliminary result)   Collection Time: 04/11/20  6:50 PM   Specimen: BLOOD  Result Value Ref Range Status   Specimen  Description BLOOD BLOOD LEFT FOREARM  Final   Special Requests   Final    BOTTLES DRAWN AEROBIC AND ANAEROBIC Blood Culture adequate volume   Culture   Final    NO GROWTH 2 DAYS Performed at [facility], [address] [place], [place] [address]    Report Status PENDING  Incomplete    Coagulation Studies: No results for input(s): LABPROT, INR in the last 72 hours.  Urinalysis: No results for input(s): COLORURINE, LABSPEC, PHURINE, GLUCOSEU, HGBUR, BILIRUBINUR, KETONESUR, PROTEINUR, UROBILINOGEN, NITRITE, LEUKOCYTESUR in the last 72 hours.  Invalid input(s): APPERANCEUR    Imaging: EEG  Result Date: 04/12/2020 Lora Havens, MD     04/12/2020 11:37 AM Patient Name: Stephanie Ellis MRN: [id number] Epilepsy Attending: Lora Havens Referring Physician/Provider: Dr. Fayrene Helper. Date: 04/11/2020 Duration: 21.55 minutes Patient history: 69 year old female with history of epilepsy who presented with altered mental status.  EEG evaluate for seizures. Level  of alertness: Awake, asleep AEDs during EEG study: Keppra. Technical aspects: This EEG study was done with scalp electrodes positioned according to the 10-20 International system of electrode placement. Electrical activity was acquired at a sampling rate of 500Hz  and reviewed with a high frequency filter of 70Hz  and a low frequency filter of 1Hz . EEG data were recorded continuously and digitally stored. Description: During awake state, no clear posterior dominant rhythm was seen.  Sleep was characterized by vertex waves, sleep spindles (12 to 14 Hz), maximal frontocentral region.  EEG showed continuous generalized 3 to 5 Hz theta-delta slowing.  Intermittent rhythmic generalized delta activity was also noted.  Sharp transients were seen in bilateral posterior quadrant.  Hyperventilation and photic stimulation were not performed.   ABNORMALITY -Continuous slow, generalized -Intermittent rhythmic delta activity, generalized IMPRESSION: This study is suggestive of moderate diffuse encephalopathy, nonspecific etiology.  No seizures or definite epileptiform discharges were seen throughout the recording. Priyanka Barbra Sarks     Medications:    Scheduled Meds: . apixaban  2.5 mg Oral BID  . aspirin EC  81 mg Oral Daily  . atorvastatin  80 mg Oral QHS  . brimonidine  1 drop Both Eyes TID  . carvedilol  37.5 mg Oral BID WC  . Chlorhexidine Gluconate Cloth  6 each Topical Q0600  . doxycycline  100 mg Oral Q12H  . fluticasone  2 spray Each Nare Daily  . hydrALAZINE  75 mg Oral Q8H  . insulin aspart  0-6 Units Subcutaneous TID WC  . ipratropium  2 spray Each Nare BID  . isosorbide mononitrate  60 mg Oral Daily  . levETIRAcetam  250 mg Oral Q M,W,F-HD  . levETIRAcetam  500 mg Oral BID  . mirtazapine  7.5 mg Oral QHS  . omega-3 acid ethyl esters  2 g Oral Daily  . pantoprazole  40 mg Oral Daily  . rOPINIRole  0.25 mg Oral QHS  . sevelamer carbonate  1,600 mg Oral TID WC  . sodium chloride flush  3 mL  Intravenous Q12H  . sodium chloride flush  3 mL Intravenous Q12H  . [START ON 04/20/2020] thiamine injection  100 mg Intravenous Daily  . timolol  1 drop Both Eyes TID  . triamcinolone   Topical BID  . valACYclovir  500 mg Oral Daily  . vitamin B-12  1,000 mcg Oral Daily   Continuous Infusions: . sodium chloride    . cefTRIAXone (ROCEPHIN)  IV Stopped (04/13/20 0307)  . thiamine injection 500 mg (04/13/20 7412)   Followed by  . [  START ON 04/15/2020] thiamine injection     PRN Meds:.sodium chloride, hydrALAZINE, meclizine, ondansetron **OR** ondansetron (ZOFRAN) IV, polyvinyl alcohol, sodium chloride flush    Assessment/ Plan:  [name] is a 69 y.o. black female with end stage renal disease on hemodialysis, peripheral vascular disease status post bilateral BKA, diabetes mellitus type II, coronary artery disease status post CABG admitted to [facility] on 04/10/2020 for Slurred speech [R47.81] Altered mental status, unspecified altered mental status type [R41.82] AMS (altered mental status) [R41.82]  [facility] MWF [address] LIJ permcath 52kg  #  End Stage Renal Disease We will continue MWF schedule Patient did not tolerate dialysis today. Treatment was stopped shortly after starting. Plan for next dialysis on Friday    # Anemia of chronic kidney disease Lab Results  Component Value Date   HGB 11.5 (L) 04/13/2020   No indication for Epogen  #Secondary Hyperparathyroidism:  Lab Results  Component Value Date   PTH 385 (H) 09/03/2019   CALCIUM 8.1 (L) 04/13/2020   PHOS 4.1 04/13/2020  Will continue monitoring bone mineral metabolism parameters  #Shingles   Patient is on valacyclovir About 1 inch area of crusting lesions on the left chest sidewall.  #Altered mental status EEG-no seizures seen Had an episode of altered mental status during dialysis.  Return to baseline as soon as blood was returned.     LOS: 3 Jacie Tristan 12/23/20211:36 PM

## 2020-04-13 NOTE — Progress Notes (Signed)
OT Cancellation Note  Patient Details Name: Stephanie Ellis MRN: [id number] DOB: 02-Aug-1950   Cancelled Treatment:    Reason Eval/Treat Not Completed: Patient at procedure or test/ unavailable. OT order received and chart reviewed. Pt currently at dialysis. OT will follow up when pt is available for OT intervention.   Darleen Crocker, [place], OTR/L , CBIS ascom [phone]  04/13/20, 10:32 AM   04/13/2020, 10:31 AM

## 2020-04-13 NOTE — TOC Progression Note (Addendum)
Transition of Care [facility]) - Progression Note    Patient Details  Name: Stephanie Ellis MRN: [id number] Date of Birth: Jan 10, 1951  Transition of [facility] CM/SW [place], LCSW Phone Number: 04/13/2020, 12:18 PM  Clinical Narrative:   Per MD patient may be ready to DC to SNF tomorrow. Per RN patient not wanting to talk or answer questions today. CSW called patient's daughter Stephanie Ellis who deferred to patient's daughter Stephanie Ellis. Called [name] who confirmed they would like patient to go to SNF rehab at discharge (patient agreed to this yesterday when CSW spoke with her). Presented bed offers for [facility]. Stephanie Ellis reported she would like patient to go to [facility]. CSW called and spoke with [name] at [facility] to accept bed offer. [name] confirmed they can take patient to her MWF dialysis using the facility's transportation. [name] confirmed patient can come to [facility] tomorrow if she is ready to DC then.  2:55- [name] at [facility] asked what patient's chair time is. Informed her that per daughter, patient goes to [facility] in [place] MWF at 11:30.  4:05- Spoke to [place] ([employer]) about plan for [facility] tomorrow. She confirmed patient is MWF at dialysis, chair time is 11:20. [name] reported she thinks patient has a Actuary, sent secure chart to MD and [facility] following tomorrow to confirm. MD is [name]. Spoke to [place] at [facility] who reported they should be able to take patient tomorrow when she is 24 hours sitter free as long as they have DC summary in before 3 pm tomorrow. Left handoff for [facility] and updated MD.   Expected Discharge Plan: Skilled Nursing Facility Barriers to Discharge: Continued Medical Work up  Expected Discharge Plan and Services Expected Discharge Plan: [place]   Discharge Planning Services: CM Consult                                           Social Determinants of Health (SDOH) Interventions    Readmission Risk  Interventions Readmission Risk Prevention Plan 04/11/2020 05/04/2019  Transportation Screening Complete Complete  PCP or Specialist Appt within 3-5 Days - Complete  Palliative Care Screening - Not Applicable  Medication Review ([place]) Complete Complete  PCP or Specialist appointment within 3-5 days of discharge Complete -  [place] or Home Care Consult Complete -  SW Recovery Care/Counseling Consult Complete -  Palliative Care Screening Not Applicable -  [place] Complete -  Some recent data might be hidden

## 2020-04-14 DIAGNOSIS — R410 Disorientation, unspecified: Secondary | ICD-10-CM | POA: Diagnosis not present

## 2020-04-14 LAB — BASIC METABOLIC PANEL
Anion gap: 15 (ref 5–15)
BUN: 41 mg/dL — ABNORMAL HIGH (ref 8–23)
CO2: 25 mmol/L (ref 22–32)
Calcium: 8.5 mg/dL — ABNORMAL LOW (ref 8.9–10.3)
Chloride: 100 mmol/L (ref 98–111)
Creatinine, Ser: 9.1 mg/dL — ABNORMAL HIGH (ref 0.44–1.00)
GFR, Estimated: 4 mL/min — ABNORMAL LOW (ref >60)
Glucose, Bld: 61 mg/dL — ABNORMAL LOW (ref 70–99)
Potassium: 4.4 mmol/L (ref 3.5–5.1)
Sodium: 140 mmol/L (ref 135–145)

## 2020-04-14 LAB — GLUCOSE, CAPILLARY
Glucose-Capillary: 57 mg/dL — ABNORMAL LOW (ref 70–99)
Glucose-Capillary: 59 mg/dL — ABNORMAL LOW (ref 70–99)
Glucose-Capillary: 165 mg/dL — ABNORMAL HIGH (ref 70–99)
Glucose-Capillary: 71 mg/dL (ref 70–99)

## 2020-04-14 LAB — MAGNESIUM: Magnesium: 2 mg/dL (ref 1.7–2.4)

## 2020-04-14 LAB — BLOOD GAS, ARTERIAL
Acid-base deficit: 1.8 mmol/L (ref 0.0–2.0)
Bicarbonate: 23.1 mmol/L (ref 20.0–28.0)
FIO2: 0.21
O2 Saturation: 94.8 %
Patient temperature: 37
pCO2 arterial: 39 mmHg (ref 32.0–48.0)
pH, Arterial: 7.38 (ref 7.350–7.450)
pO2, Arterial: 76 mmHg — ABNORMAL LOW (ref 83.0–108.0)

## 2020-04-14 LAB — HEMOGLOBIN AND HEMATOCRIT, BLOOD
HCT: 37.9 % (ref 36.0–46.0)
Hemoglobin: 11.6 g/dL — ABNORMAL LOW (ref 12.0–15.0)

## 2020-04-14 LAB — LEVETIRACETAM LEVEL: Levetiracetam Lvl: 22.4 ug/mL (ref 10.0–40.0)

## 2020-04-14 LAB — PHOSPHORUS: Phosphorus: 4.2 mg/dL (ref 2.5–4.6)

## 2020-04-14 MED ORDER — LEVETIRACETAM IN NACL 500 MG/100ML IV SOLN
500.0000 mg | Freq: Two times a day (BID) | INTRAVENOUS | Status: DC
  Filled 2020-04-14 (×13): qty 100

## 2020-04-14 MED ORDER — LOSARTAN POTASSIUM 25 MG PO TABS
25.0000 mg | ORAL_TABLET | Freq: Every day | ORAL | Status: DC
  Administered 2020-04-14 – 2020-04-18 (×5): 25 mg via ORAL
  Filled 2020-04-14 (×5): qty 1

## 2020-04-14 MED ORDER — DEXTROSE 50 % IV SOLN
1.0000 | Freq: Once | INTRAVENOUS | Status: AC
  Administered 2020-04-14: 15:00:00 50 mL via INTRAVENOUS
  Filled 2020-04-14: qty 50

## 2020-04-14 MED ORDER — LEVETIRACETAM 500 MG PO TABS
500.0000 mg | ORAL_TABLET | Freq: Two times a day (BID) | ORAL | Status: DC
  Administered 2020-04-14 – 2020-04-20 (×12): 500 mg via ORAL
  Filled 2020-04-14 (×13): qty 1

## 2020-04-14 MED ORDER — SENNOSIDES-DOCUSATE SODIUM 8.6-50 MG PO TABS
1.0000 | ORAL_TABLET | Freq: Two times a day (BID) | ORAL | Status: DC
  Administered 2020-04-14 – 2020-04-20 (×11): 1 via ORAL
  Filled 2020-04-14 (×11): qty 1

## 2020-04-14 NOTE — Progress Notes (Signed)
PT Cancellation Note  Patient Details Name: Stephanie Ellis MRN: [id number] DOB: Oct 03, 1950   Cancelled Treatment:    Reason Eval/Treat Not Completed: Fatigue/lethargy limiting ability to participate;Patient's level of consciousness (Chart reviewed, treatment attempted. Pt asleep, quite somnolent, does not appear to be awake even while lab is finishing a blood draw.) Will attempt treatment again at later date/time.    [name] C 04/14/2020, 11:02 AM

## 2020-04-14 NOTE — Progress Notes (Signed)
PT Cancellation Note  Patient Details Name: Stephanie Ellis MRN: [id number] DOB: 1950/09/05   Cancelled Treatment:    Reason Eval/Treat Not Completed: Medical issues which prohibited therapy (Per chart review, BP remains elevated outside of safe levels to participate in PT. Will resume services once medically appropriate. (187/25m Hg))   [name] C 04/14/2020, 1:23 PM

## 2020-04-14 NOTE — Progress Notes (Signed)
PROGRESS NOTE    Stephanie Ellis  XNT:[id number] DOB: December 28, 1950 DOA: 04/10/2020 PCP: Pcp, No   Brief Narrative:  This 69 y.o.femalewith PMH significant fordiabetes mellitus , ESRD on HD (M/W/F) , hypertension, PAD s/p bilateral BKA,coronary artery disease status post CABG who was brought to the emergency room by EMS for evaluation of mental status changes. Patient was recently discharged from the hospital on 04/06/20.During that hospitalization she had a brief PEA arrest aftershe received hydromorphone and recovered immediately following administration of Narcan. Her daughter states that since her discharge she has not been herusual self and has been acting very erratic. At baseline patient is able to take care of herself and activities of daily living such as transfers, grooming and feeding herself. Since her discharge she has been very weak and has been unable to feed herself or transfer to her wheelchair. Family is also concerned that her speech is slurred.Patient refused to go to dialysis on the morning of her admission and so EMS was called.  Patient is admitted with acute encephalopathy without clear cause.  MRI brain no acute infarction, hemorrhage.  EEG: no evidence of seizures.  Ammonia level normal.  Neurology is consulted.  MRI does not show any evidence of stroke or anoxic brain injury.  There is no indication for LP at this time. Recommended continued thiamine treatment.  Nephrology consulted for continuation of hemodialysis.  Pulmonology consulted for mild loculated effusion on CT chest.  There is no signs of empyema or effusion is very small for thoracocentesis.  Continue empirical treatment for CAP and encourage incentive spirometry.  Her mental status has been improving.  Assessment & Plan:   Principal Problem:   AMS (altered mental status) Active Problems:   ESRD (end stage renal disease) on dialysis ([place])   Type II diabetes mellitus with complication (HCC)    Hypertension   Seizure disorder (HCC)   Cirrhosis, cardiac   Acute Metabolic Encephalopathy >> Fluctuating Unclear etiology, at time of admission - ddx includes anoxic brain injury related to PEA arrest at last hospitalization, encephalitis in setting of shingles, hypertensive encephalopathy. MRI brian without acute findings (chronic microvascular ischemic changes and few small chronic infarcts) EEG: no evidence of seizures. Neurology consulted.  Recommended to continue thiamine.  Keppra level pending Ammonia level normal.  Vitamin B12 2900. Blood cultures no growth so far. Mental status is improving.  She is alert and oriented x2. ABG shows normal PCO2.  MRI ruled out anoxic brain injury. Patient could not complete hemodialysis 12/23.  She became less responsive during dialysis so it was held in between.  Mild Loculated Effusion : Assume this could be pneumonia.   Continue ceftriaxone and doxycycline for CAP(7 days) Blood cultures no growth so far. Pulmonology consulted, recommended to continue treatment for CAP.   There is no empyema or effusion is very small for thoracocentesis. Encourage incentive spirometry.  Physical deconditioning Most likely secondary to recent hospital stay. PT recommended SNF. Continue fall precautions.  Hypertensive urgency : Home blood pressure medication resumed.  Carvedilol, hydralazine and Imdur. Blood pressure still remains elevated with increase in Imdur to 60 mg daily Hydralazine increased to 75 mg every 8 hrs. Add losartan 25 mg daily. IV hydralazine prn for SBP > 967  R9FM with complications of end-stage renal disease on hemodialysis Maintain carb consistent diet. Glycemic control with sliding scale insulin. Nephrology consulted for renal replacement therapy. Patient could not complete hemodialysis 12/23.  She became less responsive during dialysis so it was held in between.  She will get hemodialysis today and then continued on MWF  schedule   Chronic combined systolic and diastolic dysfunction CHF Patient's last known LVEF is about 35 to 40% from an echocardiogram done in 2019 Stable and not acutely exacerbated. Volume management per renal.  Seizure disorder Continue Keppra. Continue seizure precautions EEG: no evidence of seizures.  Thrombocytopenia Chronic, continue to monitor.  Peripheral arterial disease Status post bilateral BKA Continue statin, apixaban and carvedilol  Depression Continue Remeron.  Constipation: Add Senokot and Colace   DVT prophylaxis: Eliquis Code Status: Full code Family Communication: No Family in the room Disposition Plan:   Status is: Inpatient  Remains inpatient appropriate because:Inpatient level of care appropriate due to severity of illness   Dispo: The patient is from: Home              Anticipated d/c is to:  SNF              Anticipated d/c date is:  >> 3 days              Patient currently is not medically stable to d/c.  Consultants:   Pulmonology  Nephrology  Neurology  Procedures: EEG  Antimicrobials: Anti-infectives (From admission, onward)   Start     Dose/Rate Route Frequency Ordered Stop   04/11/20 1800  valACYclovir (VALTREX) tablet 500 mg        500 mg Oral Daily 04/10/20 2043     04/11/20 1330  doxycycline (VIBRA-TABS) tablet 100 mg        100 mg Oral Every 12 hours 04/11/20 1244 04/18/20 0959   04/11/20 1300  cefTRIAXone (ROCEPHIN) 2 g in sodium chloride 0.9 % 100 mL IVPB        2 g 200 mL/hr over 30 Minutes Intravenous Every 24 hours 04/11/20 1239 04/18/20 1259   04/11/20 1300  azithromycin (ZITHROMAX) 500 mg in sodium chloride 0.9 % 250 mL IVPB  Status:  Discontinued        500 mg 250 mL/hr over 60 Minutes Intravenous Every 24 hours 04/11/20 1239 04/11/20 1244   04/10/20 1500  valACYclovir (VALTREX) tablet 500 mg  Status:  Discontinued        500 mg Oral Daily 04/10/20 1446 04/10/20 2042      Subjective: Patient was  seen and examined at bedside.  Overnight events noted.   Patient could not complete hemodialysis yesterday.  she became less responsive in between so it was discontinued.   She still appears confused in the morning,  left arm is swollen probably related to the IV line.  Objective: Vitals:   04/14/20 0840 04/14/20 1012 04/14/20 1143 04/14/20 1205  BP: (!) 188/88 (!) 181/47 (!) 166/65 (!) 187/65  Pulse:    70  Resp:   17 17  Temp: 98.2 F (36.8 C)   98 F (36.7 C)  TempSrc: Oral   Oral  SpO2:    96%  Weight:      Height:        Intake/Output Summary (Last 24 hours) at 04/14/2020 1251 Last data filed at 04/14/2020 1050 Gross per 24 hour  Intake 340 ml  Output --  Net 340 ml   Filed Weights   04/10/20 1106  Weight: 50 kg    Examination:  General exam: Appears calm and comfortable, not in any distress. Respiratory system: Clear to auscultation. Respiratory effort normal. Cardiovascular system: S1 & S2 heard, RRR. No JVD, murmurs, rubs, gallops or clicks. No pedal edema. Gastrointestinal system:  Abdomen is nondistended, soft and nontender. No organomegaly or masses felt. Normal bowel sounds heard. Central nervous system: Alert and oriented x 2. No focal neurological deficits. Extremities: Bilateral BKA.  Left arm swelling probably due to IV line. Skin: No rashes, lesions or ulcers. Psychiatry: Judgement and insight appear normal. Mood & affect appropriate.     Data Reviewed: I have personally reviewed following labs and imaging studies  CBC: Recent Labs  Lab 04/10/20 1127 04/11/20 0403 04/12/20 0553 04/13/20 0405 04/14/20 0454  WBC 4.2 3.9* 4.8 4.4  --   NEUTROABS 3.0  --  3.5  --   --   HGB 13.4 12.1 11.3* 11.5* 11.6*  HCT 43.2 39.6 36.3 38.0 37.9  MCV 96.4 98.3 96.3 96.9  --   PLT 113* 93* 97* 95*  --    Basic Metabolic Panel: Recent Labs  Lab 04/10/20 1127 04/10/20 1129 04/11/20 0403 04/12/20 0553 04/13/20 0405 04/14/20 0454  NA 140  --  139 138 136  140  K 4.8  --  4.6 3.7 4.1 4.4  CL 97*  --  99 99 101 100  CO2 28  --  24 26 25 25   GLUCOSE 186*  --  121* 79 73 61*  BUN 53*  --  57* 25* 37* 41*  CREATININE 9.33*  --  10.31* 6.32* 8.01* 9.10*  CALCIUM 8.9  --  8.7* 8.6* 8.1* 8.5*  MG  --  2.2  --  1.9 2.0 2.0  PHOS  --   --  4.2 3.3 4.1 4.2   GFR: Estimated Creatinine Clearance: 3.3 mL/min (A) (by C-G formula based on SCr of 9.1 mg/dL (H)). Liver Function Tests: Recent Labs  Lab 04/10/20 1127 04/12/20 0553  AST 17 17  ALT 13 10  ALKPHOS 92 77  BILITOT 0.6 0.6  PROT 7.4 6.6  ALBUMIN 3.7 3.2*   No results for input(s): LIPASE, AMYLASE in the last 168 hours. Recent Labs  Lab 04/10/20 1129  AMMONIA 19   Coagulation Profile: No results for input(s): INR, PROTIME in the last 168 hours. Cardiac Enzymes: No results for input(s): CKTOTAL, CKMB, CKMBINDEX, TROPONINI in the last 168 hours. BNP (last 3 results) No results for input(s): PROBNP in the last 8760 hours. HbA1C: No results for input(s): HGBA1C in the last 72 hours. CBG: Recent Labs  Lab 04/13/20 0958 04/13/20 1618 04/13/20 1651 04/13/20 1945 04/14/20 1202  GLUCAP 75 46* 159* 70 57*   Lipid Profile: No results for input(s): CHOL, HDL, LDLCALC, TRIG, CHOLHDL, LDLDIRECT in the last 72 hours. Thyroid Function Tests: Recent Labs    04/11/20 1842  TSH 11.411*   Anemia Panel: Recent Labs    04/11/20 1842  VITAMINB12 2,909*   Sepsis Labs: Recent Labs  Lab 04/12/20 0553  PROCALCITON 0.37    Recent Results (from the past 240 hour(s))  Resp Panel by RT-PCR (Flu A&B, Covid) Nasopharyngeal Swab     Status: None   Collection Time: 04/10/20 12:58 PM   Specimen: Nasopharyngeal Swab; Nasopharyngeal(NP) swabs in vial transport medium  Result Value Ref Range Status   SARS Coronavirus 2 by RT PCR NEGATIVE NEGATIVE Final    Comment: (NOTE) SARS-CoV-2 target nucleic acids are NOT DETECTED.  The SARS-CoV-2 RNA is generally detectable in upper  respiratory specimens during the acute phase of infection. The lowest concentration of SARS-CoV-2 viral copies this assay can detect is 138 copies/mL. A negative result does not preclude SARS-Cov-2 infection and should not be used as the sole basis  for treatment or other patient management decisions. A negative result may occur with  improper specimen collection/handling, submission of specimen other than nasopharyngeal swab, presence of viral mutation(s) within the areas targeted by this assay, and inadequate number of viral copies(<138 copies/mL). A negative result must be combined with clinical observations, patient history, and epidemiological information. The expected result is Negative.  Fact Sheet for Patients:  EntrepreneurPulse.com.au  Fact Sheet for Healthcare Providers:  IncredibleEmployment.be  This test is no t yet approved or cleared by the [place] FDA and  has been authorized for detection and/or diagnosis of SARS-CoV-2 by FDA under an Emergency Use Authorization (EUA). This EUA will remain  in effect (meaning this test can be used) for the duration of the COVID-19 declaration under Section 564(b)(1) of the Act, 21 U.S.C.section 360bbb-3(b)(1), unless the authorization is terminated  or revoked sooner.       Influenza A by PCR NEGATIVE NEGATIVE Final   Influenza B by PCR NEGATIVE NEGATIVE Final    Comment: (NOTE) The Xpert Xpress SARS-CoV-2/FLU/RSV plus assay is intended as an aid in the diagnosis of influenza from Nasopharyngeal swab specimens and should not be used as a sole basis for treatment. Nasal washings and aspirates are unacceptable for Xpert Xpress SARS-CoV-2/FLU/RSV testing.  Fact Sheet for Patients: EntrepreneurPulse.com.au  Fact Sheet for Healthcare Providers: IncredibleEmployment.be  This test is not yet approved or cleared by the [place] FDA and has been  authorized for detection and/or diagnosis of SARS-CoV-2 by FDA under an Emergency Use Authorization (EUA). This EUA will remain in effect (meaning this test can be used) for the duration of the COVID-19 declaration under Section 564(b)(1) of the Act, 21 U.S.C. section 360bbb-3(b)(1), unless the authorization is terminated or revoked.  Performed at [facility], [address] [place], [place] [address]   CULTURE, BLOOD (ROUTINE X 2) w Reflex to ID Panel     Status: None (Preliminary result)   Collection Time: 04/11/20  6:42 PM   Specimen: BLOOD  Result Value Ref Range Status   Specimen Description BLOOD BLOOD LEFT HAND  Final   Special Requests   Final    BOTTLES DRAWN AEROBIC AND ANAEROBIC Blood Culture results may not be optimal due to an inadequate volume of blood received in culture bottles   Culture   Final    NO GROWTH 3 DAYS Performed at [facility], [address], [place], [place] [address]    Report Status PENDING  Incomplete  CULTURE, BLOOD (ROUTINE X 2) w Reflex to ID Panel     Status: None (Preliminary result)   Collection Time: 04/11/20  6:50 PM   Specimen: BLOOD  Result Value Ref Range Status   Specimen Description BLOOD BLOOD LEFT FOREARM  Final   Special Requests   Final    BOTTLES DRAWN AEROBIC AND ANAEROBIC Blood Culture adequate volume   Culture   Final    NO GROWTH 3 DAYS Performed at [facility], [address], [place], [place] [address]    Report Status PENDING  Incomplete     Radiology Studies: No results found. Scheduled Meds: . apixaban  2.5 mg Oral BID  . aspirin EC  81 mg Oral Daily  . atorvastatin  80 mg Oral QHS  . brimonidine  1 drop Both Eyes TID  . carvedilol  37.5 mg Oral BID WC  . Chlorhexidine Gluconate Cloth  6 each Topical Q0600  . doxycycline  100 mg Oral Q12H  . fluticasone  2 spray Each Nare Daily  . hydrALAZINE  75 mg Oral Q8H  . insulin aspart  0-6 Units Subcutaneous TID WC  . ipratropium  2  spray Each Nare BID  . isosorbide mononitrate  60 mg Oral Daily  . levETIRAcetam  250 mg Oral Q M,W,F-HD  . levETIRAcetam  500 mg Oral Q12H  . losartan  25 mg Oral Daily  . mirtazapine  7.5 mg Oral QHS  . omega-3 acid ethyl esters  2 g Oral Daily  . pantoprazole  40 mg Oral Daily  . rOPINIRole  0.25 mg Oral QHS  . senna-docusate  1 tablet Oral BID  . sevelamer carbonate  1,600 mg Oral TID WC  . sodium chloride flush  3 mL Intravenous Q12H  . sodium chloride flush  3 mL Intravenous Q12H  . [START ON 04/20/2020] thiamine injection  100 mg Intravenous Daily  . timolol  1 drop Both Eyes TID  . triamcinolone   Topical BID  . valACYclovir  500 mg Oral Daily  . vitamin B-12  1,000 mcg Oral Daily   Continuous Infusions: . sodium chloride    . cefTRIAXone (ROCEPHIN)  IV Stopped (04/13/20 2114)  . levETIRAcetam    . thiamine injection 500 mg (04/14/20 0820)   Followed by  . [START ON 04/15/2020] thiamine injection       LOS: 4 days    Time spent: 35 mins.    Shawna Clamp, MD [facility]   If 7PM-7AM, please contact night-coverage

## 2020-04-14 NOTE — Evaluation (Signed)
Occupational Therapy Evaluation Patient Details Name: Maanasa Aderhold MRN: [id number] DOB: [birth year] Today's Date: 04/14/2020    History of Present Illness Carylon Tamburro is a 16yoF who [facility] c AMS. PMH: DM, ESRD on HD MWF, HTN, PAD s/p bilateral BKA, CAD, CAD s/p CABG, left eye blindnss, heavy right eye visual impairment.   Clinical Impression   Ms Lasch was seen for OT evaluation this date. Prior to hospital admission, pt was MOD I using w/c for mobility and ADLs. Pt lives c spouse. Pt presents to acute OT demonstrating impaired ADL performance and functional mobility 2/2 decreased activity tolerance, and functional strength deficits.   Pt currently requires SETUP face washing bed level, MIN A for thoroughness. Mobility not tested 2/2 supine BP outisde parameters (188/88). Pt would benefit from skilled OT to address noted impairments and functional limitations (see below for any additional details) in order to maximize safety and independence while minimizing falls risk and caregiver burden. Upon hospital discharge, recommend STR to maximize pt safety and return to PLOF.     Follow Up Recommendations  SNF    Equipment Recommendations  Other (comment) (TBD)    Recommendations for Other Services       Precautions / Restrictions Precautions Precautions: Fall Restrictions Weight Bearing Restrictions: No      Mobility Bed Mobility               General bed mobility comments: Not tested 2/2 lying BP outisde parameters (188/88)               ADL either performed or assessed with clinical judgement   ADL Overall ADL's : Needs assistance/impaired                                       General ADL Comments: SETUP face washing bed level, MIN A for thoroughness.                  Pertinent Vitals/Pain Pain Assessment: No/denies pain     Hand Dominance Right   Extremity/Trunk Assessment Upper Extremity Assessment Upper  Extremity Assessment: Generalized weakness   Lower Extremity Assessment Lower Extremity Assessment: Generalized weakness       Communication Communication Communication: No difficulties   Cognition Arousal/Alertness: Awake/alert Behavior During Therapy: WFL for tasks assessed/performed Overall Cognitive Status: No family/caregiver present to determine baseline cognitive functioning                                 General Comments: A&O self, konws location is hospital, states [facility] Comments       Exercises Exercises: Other exercises Other Exercises Other Exercises: Pt educated re: OT role, DME recs, d/c recs, falls prevention Other Exercises: Face washing, adjust torso at bed level   Shoulder Instructions      Home Living Family/patient expects to be discharged to:: Private residence Living Arrangements: Spouse/significant other Available Help at Discharge: Family;Available 24 hours/day Type of Home: Mobile home Home Access: Ramped entrance     Home Layout: One level               Home Equipment: [place] - 2 wheels;Wheelchair - manual   Additional Comments: Bilat BKA prostheses; previously AMB household distances with RW;      Prior Functioning/Environment Level of Independence: Independent with assistive device(s)  Gait / Transfers Assistance Needed: WC for HD; prosthetic legs bilat c RW for household distance AMB; ADL's / Homemaking Assistance Needed: independent at baseline, ADL, donning/doffing prestheses Communication / Swallowing Assistance Needed: visually impaired at baseline, needs magnifying glass, but can see to feed self typically.          OT Problem List: Decreased strength;Decreased activity tolerance;Impaired balance (sitting and/or standing);Decreased safety awareness;Decreased coordination;Cardiopulmonary status limiting activity      OT Treatment/Interventions: Self-care/ADL training;Therapeutic  exercise;Energy conservation;DME and/or AE instruction;Therapeutic activities;Patient/family education;Balance training    OT Goals(Current goals can be found in the care plan section) Acute Rehab OT Goals Patient Stated Goal: improve mentation, mobiltiy, safety to baseline OT Goal Formulation: With patient Time For Goal Achievement: 04/28/20 Potential to Achieve Goals: Good ADL Goals Pt Will Perform Grooming: with modified independence;sitting Pt Will Perform Lower Body Dressing: with modified independence;sitting/lateral leans Pt Will Transfer to Toilet: with min guard assist;anterior/posterior transfer;bedside commode  OT Frequency: Min 1X/week    AM-PAC OT "6 Clicks" Daily Activity     Outcome Measure Help from another person eating meals?: None Help from another person taking care of personal grooming?: A Little Help from another person toileting, which includes using toliet, bedpan, or urinal?: A Lot Help from another person bathing (including washing, rinsing, drying)?: A Little Help from another person to put on and taking off regular upper body clothing?: A Little Help from another person to put on and taking off regular lower body clothing?: A Little 6 Click Score: 18   End of Session    Activity Tolerance: Patient tolerated treatment well Patient left: in bed;with call bell/phone within reach;with bed alarm set;with nursing/sitter in room  OT Visit Diagnosis: Other abnormalities of gait and mobility (R26.89)                Time: 1829-9371 OT Time Calculation (min): 14 min Charges:  OT General Charges $OT Visit: 1 Visit OT Evaluation $OT Eval Low Complexity: 1 Low OT Treatments $Self Care/Home Management : 8-22 mins  Dessie Coma, M.S. OTR/L  04/14/20, 9:11 AM  ascom [phone]

## 2020-04-14 NOTE — TOC Progression Note (Signed)
Transition of Care [facility]) - Progression Note    Patient Details  Name: [name] MRN: [id number] Date of Birth: 1950-08-15  Transition of Care [facility] CM/SW Contact  Shelbie Hutching, RN Phone Number: 04/14/2020, 9:05 AM  Clinical Narrative:    Patient is not medically cleared for discharge today.  Patient did not tolerate dialysis yesterday so will be attempted again today.  Nephrology following.  Patient also still has a 1:1 sitter and will need to be sitter free for 24 hours.    Expected Discharge Plan: [place] Barriers to Discharge: Continued Medical Work up  Expected Discharge Plan and Services Expected Discharge Plan: [place]   Discharge Planning Services: CM Consult                                           Social Determinants of Health (SDOH) Interventions    Readmission Risk Interventions Readmission Risk Prevention Plan 04/11/2020 05/04/2019  Transportation Screening Complete Complete  PCP or Specialist Appt within 3-5 Days - Complete  Palliative Care Screening - Not Applicable  Medication Review ([place]) Complete Complete  PCP or Specialist appointment within 3-5 days of discharge Complete -  [place] or Home Care Consult Complete -  SW Recovery Care/Counseling Consult Complete -  Palliative Care Screening Not Applicable -  [place] Complete -  Some recent data might be hidden

## 2020-04-14 NOTE — Progress Notes (Signed)
Pulmonary Medicine          Date: 04/14/2020,   MRN# [id number] Stephanie Ellis Sep 05, 1950     AdmissionWeight: 50 kg                 CurrentWeight: 50 kg   Referring physician Dr. Dwyane Dee   CHIEF COMPLAINT:   Loculated pleural effusion   HISTORY OF PRESENT ILLNESS    Stephanie Ellis is a 69F with PMH significant for diabetes mellitus with complications of ESRD on HD (M/W/F) , hypertension, PAD s/p bilateral BKA, coronary artery disease status post CABG who was brought to the emergency room by EMS for evaluation of mental status changes.  Patient was recently discharged from the hospital on 04/06/20.  During that hospitalization she had a brief PEA arrest after she received hydromorphone and recovered immediately following administration of Narcan.  Her daughter states that since her discharge she has not been her usual self and has been acting very erratic.  At baseline patient is able to take care of herself and thyroid activities of daily living such as transfers, grooming and feeding herself.  Since her discharge she has been very weak and has been unable to feed herself or transfer to her wheelchair.  Family is also concerned that her speech is slurred.  Patient refused to go to dialysis on the morning of her admission and so EMS was called.  CT scan of the head without contrast shows no acute intracranial findings.Chronic microvascular ischemic change and cerebral volume loss.Chest x ray reviewed by me shows central catheter as described without pneumothorax. Small area of suspected pneumonia lateral right base. Lungs elsewhere clear. There is cardiomegaly with postoperative coronary artery bypass grafting change. Aortic Atherosclerosis . Pulmonary consultation placed for possible empyma vs parapneumonic effusion    04/13/20- patient is in deep sleep during my evaluation this evening. Sitter states she only said 2 words all day and did not eat meal. Vitals are with accelerated  HTN but oxygenating well on room air.   04/14/20- patient improved. Evaluated while in dialysis.  Her BP is improved, breathing freely without SOB and speaks in full sentences. Still with confusion. Respiratory status reaching baseline. PCCM will sign off but available if needed.    PAST MEDICAL HISTORY   Past Medical History:  Diagnosis Date  . (HFpEF) heart failure with preserved ejection fraction ([place])    a. 01/2018 Echo: EF 50-55%, no rwma, Gr1 DD, triv AI, mild MR. Midly dil LA/RV, mod reduced RV fxn. Irreg thickening of TV w/ mobile echodensity that appears to arise from valve.  . Anemia   . Anorexia   . Bacteremia due to Pseudomonas   . Carotid arterial disease ([place])    a. 01/2017 Carotid U/S: RICA <74, LICA <16.  Marland Kitchen CHF (congestive heart failure) ([place])   . Complication of anesthesia    a. Pt reports h/o complication on 9 different occasions - ? hypotension/arrest.  . 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 01/2015 Cath [facility]): 70% LM, p-mLAD 50-60% (Resting FFR 0.75), mRCA 80-90%, ~40 Ost OM & D1-->CABG; b. 01/2016 Staged PCI of LCX x 2 and RCA.  Marland Kitchen ESRD (end stage renal disease) on dialysis ([place])    a. ESRD secondary to acute kidney failure s/p CABG-->PD  . Essential hypertension   . GERD (gastroesophageal reflux disease)   . Heart murmur   . Hypercholesterolemia   . Myocardial infarction ([place]) 2016  . PVD (peripheral  vascular disease) ([place])    a. 01/30/2018 PV Angio: Sev Left Popliteal dzs s/p PTA w/ 36m lutonix DEB w/ mech aspiration of the L popliteal, L AT, and Left tibioperoneal trunck and peroneal artery.  . 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  [facility]: Dr. BMarland KitchenHaithcock: CABG x 3, LIMA to LAD, SVG to RCA, SVG to OM3, EVH  . Sinusitis 2019  . Type II diabetes mellitus with complication (HCC)    CAD     SURGICAL HISTORY   Past Surgical History:  Procedure Laterality Date  . ABDOMINAL HYSTERECTOMY    . AMPUTATION Right 02/25/2018    Procedure: AMPUTATION BELOW KNEE;  Surgeon: SKatha Cabal MD;  Location: [facility] ORS;  Service: Vascular;  Laterality: Right;  . AMPUTATION Left 03/11/2018   Procedure: AMPUTATION BELOW KNEE;  Surgeon: DAlgernon Huxley MD;  Location: [facility] OR[facility];  Service: Vascular;  Laterality: Left;  . AMPUTATION Bilateral 04/13/2018   Procedure: REVISION OF BILATERAL AMPUTATIONS BELOW KNEE WITH WOUND VAC APPLICATIONS;  Surgeon: EEvaristo Bury MD;  Location: [facility];  Service: Vascular;  Laterality: Bilateral;  . AMPUTATION TOE Left 02/06/2018   Procedure: AMPUTATION GREAT TOE;  Surgeon: FSamara Deist DPM;  Location: [facility];  Service: Podiatry;  Laterality: Left;  . APPLICATION OF WOUND VAC Left 04/13/2018   Procedure: APPLICATION OF WOUND VAC;  Surgeon: EEvaristo Bury MD;  Location: [facility];  Service: Vascular;  Laterality: Left;  . ARTERIOVENOUS GRAFT PLACEMENT  05/2016  . AV FISTULA PLACEMENT Left 05/30/2016   Procedure: ARTERIOVENOUS graft;  Surgeon: JAlgernon Huxley MD;  Location: [facility];  Service: Vascular;  Laterality: Left;  . CAPD INSERTION N/A 10/30/2017   Procedure: LAPAROSCOPIC INSERTION CONTINUOUS AMBULATORY PERITONEAL DIALYSIS  (CAPD) CATHETER;  Surgeon: DAlgernon Huxley MD;  Location: [facility];  Service: Vascular;  Laterality: N/A;  . CARDIAC CATHETERIZATION  01/2015   [facility]: Ost LM 70%, p-m LAD 50-60% (Rest FFR + @ 0.75), mRCA 80-90%, ostD1 40%, pOM1 40%  . CATARACT EXTRACTION W/PHACO Left 01/18/2016   Procedure: CATARACT EXTRACTION PHACO AND INTRAOCULAR LENS PLACEMENT (IOC);  Surgeon: BEulogio Bear MD;  Location: [facility];  Service: Ophthalmology;  Laterality: Left;  [place]1.05 AP% 15.5 CDE 10.16 Fluid Pack Lot # [id number]  . CATARACT EXTRACTION W/PHACO Right 08/01/2016   Procedure: CATARACT EXTRACTION PHACO AND INTRAOCULAR LENS PLACEMENT (IOC);  Surgeon: BEulogio Bear MD;  Location: [facility];  Service: Ophthalmology;  Laterality: Right;  [place]01:00.6 AP% 11.4 CDE 6.93  LOT # [id number]  .  COLONOSCOPY    . CORONARY ANGIOPLASTY     SENTS 02/12/16  . CORONARY ARTERY BYPASS GRAFT  03/28/15    [facility]: Dr. HWaldemar Dickens LIMA to LAD, SVG to RCA, SVG to OM3, EVH  . DIALYSIS/PERMA CATHETER INSERTION N/A 02/11/2018   Procedure: DIALYSIS/PERMA CATHETER INSERTION;  Surgeon: DAlgernon Huxley MD;  [place] [place];  Service: Cardiovascular;  Laterality: N/A;  . DIALYSIS/PERMA CATHETER REMOVAL Right 02/04/2018   Procedure: DIALYSIS/PERMA CATHETER REMOVAL;  Surgeon: SKatha Cabal MD;  [facility];  Service: Cardiovascular;  Laterality: Right;  . ESOPHAGOGASTRODUODENOSCOPY (EGD) WITH PROPOFOL N/A 10/24/2016   Procedure: ESOPHAGOGASTRODUODENOSCOPY (EGD) WITH PROPOFOL;  Surgeon: WLucilla Lame MD;  Location: [facility];  Service: Endoscopy;  Laterality: N/A;  . EYE SURGERY Bilateral    cataract surgery  . EYE SURGERY     drains for glaucoma  . INSERTION EXPRESS TUBE SHUNT Right 08/01/2016   Procedure: INSERTION  EXPRESS TUBE SHUNT;  Surgeon: Eulogio Bear, MD;  Location: [facility];  Service: Ophthalmology;  Laterality: Right;  . INSERTION OF AHMED VALVE Left 08/15/2016   Procedure: INSERTION OF AHMED VALVE;  Surgeon: Eulogio Bear, MD;  Location: [facility];  Service: Ophthalmology;  Laterality: Left;  . INSERTION OF DIALYSIS CATHETER    . LOWER EXTREMITY ANGIOGRAPHY Right 02/19/2018   Procedure: Lower Extremity Angiography;  Surgeon: Algernon Huxley, MD;  Location: [place] [facility];  Service: Cardiovascular;  Laterality: Right;  . LOWER EXTREMITY ANGIOGRAPHY Left 01/30/2018   Procedure: Lower Extremity Angiography;  Surgeon: Katha Cabal, MD;  Location: [employer] [facility];  Service: Cardiovascular;  Laterality: Left;  . REMOVAL OF A DIALYSIS CATHETER N/A 02/25/2018   Procedure: REMOVAL OF A DIALYSIS CATHETER;  Surgeon: Katha Cabal, MD;  Location: [facility];  Service: Vascular;  Laterality: N/A;  . TEMPORARY DIALYSIS CATHETER N/A 02/06/2018    Procedure: TEMPORARY DIALYSIS CATHETER;  Surgeon: Katha Cabal, MD;  Location: [place] [facility];  Service: Cardiovascular;  Laterality: N/A;  . TRANSTHORACIC ECHOCARDIOGRAM  January 2017    EF 60-65%. GR 2 DD. Mild degenerative mitral valve disease but no prolapse or regurgitation. Mild left atrial dilation. Mild to moderate LVH. Pericardial effusion gone  . UPPER EXTREMITY ANGIOGRAPHY Left 06/27/2016   Procedure: Upper Extremity Angiography;  Surgeon: Algernon Huxley, MD;  Location: [place] [facility];  Service: Cardiovascular;  Laterality: Left;  . UPPER EXTREMITY ANGIOGRAPHY Left 08/22/2016   Procedure: Upper Extremity Angiography;  Surgeon: Algernon Huxley, MD;  Location: [place] [facility];  Service: Cardiovascular;  Laterality: Left;  Marland Kitchen VASCULAR SURGERY    . WOUND DEBRIDEMENT Left 05/08/2018   Procedure: DEBRIDEMENT OF LEFT LEG BKA;  Surgeon: Algernon Huxley, MD;  Location: [facility];  Service: General;  Laterality: Left;     FAMILY HISTORY   Family History  Problem Relation Age of Onset  . Diabetes Mellitus II Mother   . Heart failure Mother   . Pancreatic cancer Father      SOCIAL HISTORY   Social History   Tobacco Use  . Smoking status: Never Smoker  . Smokeless tobacco: Never Used  Vaping Use  . Vaping Use: Never used  Substance Use Topics  . Alcohol use: No  . Drug use: No     MEDICATIONS    Home Medication:    Current Medication:  Current Facility-Administered Medications:  .  0.9 %  sodium chloride infusion, 250 mL, Intravenous, PRN, Agbata, Tochukwu, MD .  apixaban (ELIQUIS) tablet 2.5 mg, 2.5 mg, Oral, BID, Benita Gutter, RPH, 2.5 mg at 04/14/20 0820 .  aspirin EC tablet 81 mg, 81 mg, Oral, Daily, Benita Gutter, RPH, 81 mg at 04/14/20 0815 .  atorvastatin (LIPITOR) tablet 80 mg, 80 mg, Oral, QHS, Agbata, Tochukwu, MD, 80 mg at 04/13/20 2034 .  brimonidine (ALPHAGAN) 0.2 % ophthalmic solution 1 drop, 1 drop, Both Eyes, TID, Benita Gutter, RPH, 1  drop at 04/14/20 1522 .  carvedilol (COREG) tablet 37.5 mg, 37.5 mg, Oral, BID WC, Dorothe Pea, RPH, 37.5 mg at 04/14/20 0815 .  cefTRIAXone (ROCEPHIN) 2 g in sodium chloride 0.9 % 100 mL IVPB, 2 g, Intravenous, Q24H, Shawna Clamp, MD, Stopped at 04/13/20 2114 .  Chlorhexidine Gluconate Cloth 2 % PADS 6 each, 6 each, Topical, Q0600, Murlean Iba, MD, 6 each at 04/14/20 0824 .  doxycycline (VIBRA-TABS) tablet 100 mg, 100 mg, Oral,  Q12H, Shawna Clamp, MD, 100 mg at 04/14/20 0820 .  fluticasone (FLONASE) 50 MCG/ACT nasal spray 2 spray, 2 spray, Each Nare, Daily, Benita Gutter, RPH, 2 spray at 04/14/20 0816 .  hydrALAZINE (APRESOLINE) injection 5 mg, 5 mg, Intravenous, Q6H PRN, Elodia Florence., MD, 5 mg at 04/13/20 1635 .  hydrALAZINE (APRESOLINE) tablet 75 mg, 75 mg, Oral, Q8H, Shawna Clamp, MD, 75 mg at 04/14/20 1320 .  insulin aspart (novoLOG) injection 0-6 Units, 0-6 Units, Subcutaneous, TID WC, Agbata, Tochukwu, MD .  ipratropium (ATROVENT) 0.06 % nasal spray 2 spray, 2 spray, Each Nare, BID, Benita Gutter, RPH, 2 spray at 04/14/20 8115 .  [COMPLETED] isosorbide mononitrate (IMDUR) 24 hr tablet 30 mg, 30 mg, Oral, Once, 30 mg at 04/12/20 1433 **FOLLOWED BY** isosorbide mononitrate (IMDUR) 24 hr tablet 60 mg, 60 mg, Oral, Daily, Shawna Clamp, MD, 60 mg at 04/14/20 0815 .  levETIRAcetam (KEPPRA) IVPB 500 mg/100 mL premix, 500 mg, Intravenous, Q12H **OR** levETIRAcetam (KEPPRA) tablet 500 mg, 500 mg, Oral, Q12H, Dorothe Pea, RPH .  levETIRAcetam (KEPPRA) tablet 250 mg, 250 mg, Oral, Q M,W,F-HD, Benita Gutter, RPH, 250 mg at 04/14/20 1143 .  losartan (COZAAR) tablet 25 mg, 25 mg, Oral, Daily, Shawna Clamp, MD, 25 mg at 04/14/20 1320 .  meclizine (ANTIVERT) tablet 25 mg, 25 mg, Oral, Q4H PRN, Agbata, Tochukwu, MD .  mirtazapine (REMERON) tablet 7.5 mg, 7.5 mg, Oral, QHS, Agbata, Tochukwu, MD, 7.5 mg at 04/13/20 2053 .  omega-3 acid ethyl esters (LOVAZA) capsule 2 g,  2 g, Oral, Daily, Benita Gutter, RPH, 2 g at 04/14/20 0815 .  ondansetron (ZOFRAN) tablet 4 mg, 4 mg, Oral, Q6H PRN **OR** ondansetron (ZOFRAN) injection 4 mg, 4 mg, Intravenous, Q6H PRN, Agbata, Tochukwu, MD .  pantoprazole (PROTONIX) EC tablet 40 mg, 40 mg, Oral, Daily, Agbata, Tochukwu, MD, 40 mg at 04/14/20 0817 .  polyvinyl alcohol (LIQUIFILM TEARS) 1.4 % ophthalmic solution 1 drop, 1 drop, Both Eyes, PRN, Benita Gutter, RPH, 1 drop at 04/10/20 2143 .  rOPINIRole (REQUIP) tablet 0.25 mg, 0.25 mg, Oral, QHS, Agbata, Tochukwu, MD, 0.25 mg at 04/13/20 2029 .  senna-docusate (Senokot-S) tablet 1 tablet, 1 tablet, Oral, BID, Shawna Clamp, MD .  sevelamer carbonate (RENVELA) tablet 1,600 mg, 1,600 mg, Oral, TID WC, Benita Gutter, RPH, 1,600 mg at 04/14/20 1143 .  sodium chloride flush (NS) 0.9 % injection 3 mL, 3 mL, Intravenous, Q12H, Agbata, Tochukwu, MD, 3 mL at 04/14/20 0812 .  sodium chloride flush (NS) 0.9 % injection 3 mL, 3 mL, Intravenous, Q12H, Agbata, Tochukwu, MD, 3 mL at 04/14/20 0812 .  sodium chloride flush (NS) 0.9 % injection 3 mL, 3 mL, Intravenous, PRN, Agbata, Tochukwu, MD .  [COMPLETED] thiamine 555m in normal saline (521m IVPB, 500 mg, Intravenous, Q8H, Last Rate: 100 mL/hr at 04/14/20 1520, 500 mg at 04/14/20 1520 **FOLLOWED BY** [START ON 04/15/2020] thiamine (B-1) 250 mg in sodium chloride 0.9 % 50 mL IVPB, 250 mg, Intravenous, Daily **FOLLOWED BY** [START ON 04/20/2020] thiamine (B-1) injection 100 mg, 100 mg, Intravenous, Daily, PoFlorene GlenA CaClint Lipps MD .  timolol (TIMOPTIC) 0.5 % ophthalmic solution 1 drop, 1 drop, Both Eyes, TID, ChBenita GutterRPH, 1 drop at 04/14/20 1522 .  triamcinolone (KENALOG) 0.1 % cream, , Topical, BID, ChBenita GutterPDakota Surgery And Laser Center LLCGiven at 04/14/20 [id number]  valACYclovir (VALTREX) tablet 500 mg, 500 mg, Oral, Daily, ChBenita GutterRPH, 500 mg at 04/12/20 1703 .  vitamin B-12 (CYANOCOBALAMIN) tablet 1,000 mcg, 1,000 mcg, Oral, Daily,  Benita Gutter, RPH, 1,000 mcg at 04/14/20 1610    ALLERGIES   Chlorthalidone, Dilaudid [hydromorphone hcl], Fentanyl, Midazolam, Morphine and related, Ace inhibitors, Angiotensin receptor blockers, Metoclopramide, Norvasc [amlodipine], and Phenergan [promethazine hcl]     REVIEW OF SYSTEMS    Review of Systems:  Gen:  Denies  fever, sweats, chills weigh loss  HEENT: Denies blurred vision, double vision, ear pain, eye pain, hearing loss, nose bleeds, sore throat Cardiac:  No dizziness, chest pain or heaviness, chest tightness,edema Resp:   Denies cough or sputum porduction, shortness of breath,wheezing, hemoptysis,  Gi: Denies swallowing difficulty, stomach pain, nausea or vomiting, diarrhea, constipation, bowel incontinence Gu:  Denies bladder incontinence, burning urine Ext:   Denies Joint pain, stiffness or swelling Skin: Denies  skin rash, easy bruising or bleeding or hives Endoc:  Denies polyuria, polydipsia , polyphagia or weight change Psych:   Denies depression, insomnia or hallucinations   Other:  All other systems negative   VS: BP (!) 187/65 (BP Location: Right Arm)   Pulse 70   Temp 98 F (36.7 C) (Oral)   Resp 17   Ht 4' 4"  (1.321 m)   Wt 50 kg   SpO2 96%   BMI 28.66 kg/m      PHYSICAL EXAM    GENERAL:NAD, poorly responsive HEAD: Normocephalic, atraumatic.  EYES: Pupils equal, round, reactive to light. Extraocular muscles intact. No scleral icterus.  MOUTH: Moist mucosal membrane. Dentition intact. No abscess noted.  EAR, NOSE, THROAT: Clear without exudates. No external lesions.  NECK: Supple. No thyromegaly. No nodules. No JVD.  PULMONARY: decreased bs bilaterally  CARDIOVASCULAR: S1 and S2. Regular rate and rhythm. No murmurs, rubs, or gallops. No edema. Pedal pulses 2+ bilaterally.  GASTROINTESTINAL: Soft, nontender, nondistended. No masses. Positive bowel sounds. No hepatosplenomegaly.  MUSCULOSKELETAL: No swelling, clubbing, or edema. Range  of motion full in all extremities.  NEUROLOGIC:GCS 9 SKIN: No ulceration, lesions, rashes, or cyanosis. Skin warm and dry. Turgor intact.  PSYCHIATRIC: Mood, affect within normal limits. The patient is awake, alert and oriented x 3. Insight, judgment intact.       IMAGING    EEG  Result Date: 04/12/2020 Lora Havens, MD     04/12/2020 11:37 AM Patient Name: Truda Staub MRN: [id number] Epilepsy Attending: Lora Havens Referring Physician/Provider: Dr. Fayrene Helper. Date: 04/11/2020 Duration: 21.55 minutes Patient history: 69 year old female with history of epilepsy who presented with altered mental status.  EEG evaluate for seizures. Level of alertness: Awake, asleep AEDs during EEG study: Keppra. Technical aspects: This EEG study was done with scalp electrodes positioned according to the 10-20 International system of electrode placement. Electrical activity was acquired at a sampling rate of 500Hz  and reviewed with a high frequency filter of 70Hz  and a low frequency filter of 1Hz . EEG data were recorded continuously and digitally stored. Description: During awake state, no clear posterior dominant rhythm was seen.  Sleep was characterized by vertex waves, sleep spindles (12 to 14 Hz), maximal frontocentral region.  EEG showed continuous generalized 3 to 5 Hz theta-delta slowing.  Intermittent rhythmic generalized delta activity was also noted.  Sharp transients were seen in bilateral posterior quadrant.  Hyperventilation and photic stimulation were not performed.   ABNORMALITY -Continuous slow, generalized -Intermittent rhythmic delta activity, generalized IMPRESSION: This study is suggestive of moderate diffuse encephalopathy, nonspecific etiology.  No seizures or definite epileptiform discharges were seen throughout the recording. Priyanka Barbra Sarks  CT ABDOMEN PELVIS WO CONTRAST  Result Date: 04/02/2020 CLINICAL DATA:  Left upper quadrant abdominal pain EXAM: CT ABDOMEN AND PELVIS  WITHOUT CONTRAST TECHNIQUE: Multidetector CT imaging of the abdomen and pelvis was performed following the standard protocol without IV contrast. COMPARISON:  CT 05/01/2019 FINDINGS: Lower chest: Atelectatic changes are present in the lung bases including more bandlike areas of subsegmental atelectasis. Some interlobular septal thickening is noted as well which could reflect early edema. Cardiomegaly appearance. Tip of a dual lumen dialysis catheter is noted. Post sternotomy changes. Coronary artery calcifications. Hepatobiliary: No visible liver lesion on this unenhanced CT. Liver attenuation is within normal limits. Scattered calcifications in the liver are favored to be vascular in nature. Gallbladder wall appears thickened with some pericholecystic fluid though may be redistributed. Layering calcified gallstones and probable sludge present within the lumen. Pancreas: No pancreatic ductal dilatation or surrounding inflammatory changes. Spleen: Normal in size. No concerning splenic lesions. Linear branching calcifications are present. Adrenals/Urinary Tract: Normal adrenal glands. Kidneys are not symmetric in size and normally located. Extensive vascular calcifications in the renal hila. Few subcentimeter hypoattenuating foci too small to fully characterize on CT imaging but statistically likely benign. No visible or contour deforming renal lesions. No urolithiasis or hydronephrosis is evident. Urinary bladder is largely decompressed at the time of exam and therefore poorly evaluated by CT imaging. Circumferential bladder wall thickening is nonspecific given underdistention. Stomach/Bowel: Distal esophagus, stomach and duodenum are unremarkable. No conspicuous small bowel thickening or dilatation. Portion of small bowel protrudes into a small infraumbilical ventral hernia anteriorly albeit without convincing evidence of mechanical obstruction or vascular compromise at this time. There is some diffuse mild  pancolonic mural thickening. Scattered colonic diverticula without focal inflammation to suggest diverticulitis. Vascular/Lymphatic: Extensive atherosclerotic calcification throughout the abdominal aorta and branch vessels. No aneurysm or ectasia. Limited evaluation the absence of contrast. No suspicious or enlarged lymph nodes in the included lymphatic chains. Reproductive: Uterus is surgically absent. No concerning adnexal lesions. Other: Small volume simple attenuation ascites. No free air. Diffuse body wall edema. Fat and bowel containing infraumbilical ventral hernia as above. No other bowel containing hernia. No retroperitoneal or body wall hematoma. Musculoskeletal: Remote fracture deformity of the left inferior pubic ramus. No acute osseous abnormality or suspicious osseous lesion. Musculature is unremarkable. IMPRESSION: 1. Cholelithiasis with gallbladder wall thickening and pericholecystic fluid though may be redistributed. If there is clinical concern for acute cholecystitis, consider further evaluation with right upper quadrant ultrasound. 2. Mild diffuse pancolonic mural thickening, nonspecific, but can be seen with colitis or possibly related to the abdominal ascites. 3. Fat and small bowel containing infraumbilical ventral hernia anteriorly albeit without convincing evidence of mechanical obstruction or vascular compromise at this time. 4. Circumferential bladder wall thickening is nonspecific given underdistention. Recommend correlation with urinalysis to exclude cystitis. 5. Small volume simple attenuation ascites. Diffuse body wall edema. Correlate for features of anasarca. 6. Cardiomegaly with features of edema and basilar atelectasis. 7. Aortic Atherosclerosis (ICD10-I70.0). Electronically Signed   By: Lovena Le M.D.   On: 04/02/2020 15:19   DG Chest 2 View  Result Date: 04/10/2020 CLINICAL DATA:  Altered mental status EXAM: CHEST - 2 VIEW COMPARISON:  April 02, 2020 FINDINGS: Central  catheter tip is in the right atrium, slightly distal to the cavoatrial junction. No pneumothorax. There is a small area of ill-defined opacity in the lateral right base. Lungs elsewhere are clear. There is cardiomegaly with pulmonary vascular normal. Patient is status post coronary artery bypass  grafting. There is aortic atherosclerosis. No bone lesions. IMPRESSION: Central catheter as described without pneumothorax. Small area of suspected pneumonia lateral right base. Lungs elsewhere clear. There is cardiomegaly with postoperative coronary artery bypass grafting change. Aortic Atherosclerosis (ICD10-I70.0). Electronically Signed   By: Lowella Grip III M.D.   On: 04/10/2020 12:19   CT Head Wo Contrast  Result Date: 04/10/2020 CLINICAL DATA:  Altered mental status EXAM: CT HEAD WITHOUT CONTRAST TECHNIQUE: Contiguous axial images were obtained from the base of the skull through the vertex without intravenous contrast. COMPARISON:  05/01/2019 FINDINGS: Brain: No evidence of acute infarction, hemorrhage, hydrocephalus, extra-axial collection or mass lesion/mass effect. Scattered low-density changes within the periventricular and subcortical white matter compatible with chronic microvascular ischemic change. Mild diffuse cerebral volume loss. Vascular: Atherosclerotic calcifications involving the large vessels of the skull base. No unexpected hyperdense vessel. Skull: Normal. Negative for fracture or focal lesion. Sinuses/Orbits: Chronic mucosal thickening within the left sphenoid sinus and posterior left ethmoid air cells. Remaining paranasal sinuses and mastoid air cells are clear. Other: None. IMPRESSION: 1. No acute intracranial findings. 2. Chronic microvascular ischemic change and cerebral volume loss. Electronically Signed   By: Davina Poke D.O.   On: 04/10/2020 12:29   CT CHEST WO CONTRAST  Result Date: 04/10/2020 CLINICAL DATA:  Altered mental status EXAM: CT CHEST WITHOUT CONTRAST  TECHNIQUE: Multidetector CT imaging of the chest was performed following the standard protocol without IV contrast. COMPARISON:  Chest radiograph April 10, 2020; chest CT Sep 02, 2019 FINDINGS: Note that there is a degree of motion artifact. Cardiovascular: There is no thoracic aortic aneurysm. There are scattered foci of calcification in visualized great vessels. There is aortic atherosclerosis as well as multiple foci of coronary artery calcification. There is cardiomegaly. No pericardial effusion or pericardial thickening. Central catheter tip is in the right atrium, slightly beyond the cavoatrial junction. Mediastinum/Nodes: Thyroid appears unremarkable. No appreciable thoracic adenopathy. No esophageal lesions are evident. Lungs/Pleura: There is atelectatic change in the lung bases. There is suspected loculated effusion along the lateral aspect of the right major fissure. No frank airspace consolidation. No appreciable pleural effusions. No pneumothorax. Trachea and major bronchial structures appear patent, although there is a degree of motion artifact. Upper Abdomen: There is upper abdominal aortic as well as multiple mesenteric arterial vascular calcifications. Visualized upper abdominal structures otherwise appear unremarkable. Musculoskeletal: Status post median sternotomy. No blastic or lytic bone lesions evident. No evident chest wall lesions. IMPRESSION: 1. Study somewhat less than optimal due to a degree of motion artifact. 2. Areas of atelectatic change. Mild loculated effusion along the lateral aspect of the right major fissure. No edema or consolidation. 3.  No evident adenopathy. 4. Aortic atherosclerosis as well as foci of great vessel and coronary artery calcification. There is a degree of cardiomegaly. Central catheter tip in right atrium. Aortic Atherosclerosis (ICD10-I70.0). Electronically Signed   By: Lowella Grip III M.D.   On: 04/10/2020 14:59   MR BRAIN WO CONTRAST  Result Date:  04/10/2020 CLINICAL DATA:  Mental status change EXAM: MRI HEAD WITHOUT CONTRAST TECHNIQUE: Multiplanar, multiecho pulse sequences of the brain and surrounding structures were obtained without intravenous contrast. COMPARISON:  March 2020 FINDINGS: Motion artifact is present. Brain: There is no acute infarction or intracranial hemorrhage. There is no intracranial mass, mass effect, or edema. There is no hydrocephalus or extra-axial fluid collection. Prominence of the ventricles and sulci reflects generalized parenchymal volume loss. Patchy and confluent areas of T2 hyperintensity in the supratentorial  white matter are nonspecific but probably reflect moderate chronic microvascular ischemic changes. 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 Small chronic infarcts of the cerebellum. There are a few scattered small foci of susceptibility hypointensity in the cerebral white matter likely reflecting chronic microhemorrhages. Vascular: Major vessel flow voids at the skull base are preserved. Skull and upper cervical spine: Normal marrow signal is preserved. Sinuses/Orbits: Patchy paranasal sinus mucosal thickening, including chronic left sphenoid mucosal thickening. Bilateral lens replacements. Other: Sella is unremarkable.  Patchy mastoid fluid opacification. IMPRESSION: No acute infarction, hemorrhage, or mass. Chronic microvascular ischemic changes and few small chronic infarcts. No substantial change since 2020. Electronically Signed   By: Macy Mis M.D.   On: 04/10/2020 19:56   DG Chest Portable 1 View  Result Date: 04/02/2020 CLINICAL DATA:  Left upper quadrant pain beginning 2 days prior radiating to left chest and back, history of CHF, CAD, diabetes EXAM: PORTABLE CHEST 1 VIEW COMPARISON:  Radiograph 11/03/2019, CT 09/02/2019 FINDINGS: Left IJ approach dual lumen dialysis catheter tip projects over the right atrium. Pacer pads and telemetry leads overlie the chest. Postsurgical  changes related to prior CABG including intact and aligned sternotomy wires and multiple surgical clips projecting over the mediastinum. Cardiomegaly is similar to prior portable radiography. The aorta is calcified. The remaining cardiomediastinal contours are unremarkable. Mixed hazy and patchy opacities are present throughout both lungs with indistinct, congested pulmonary vascularity, fissural and septal thickening, and small bilateral effusions. Biapical pleuroparenchymal scarring. No pneumothorax. No acute osseous or soft tissue abnormality. Degenerative changes are present in the imaged spine and shoulders. IMPRESSION: 1. Findings compatible with CHF/volume overload with cardiomegaly and edema, effusions. 2. More coalescent airspace disease is likely to reflect alveolar edema though infection could present similarly. Electronically Signed   By: Lovena Le M.D.   On: 04/02/2020 15:11   US Abdomen Limited RUQ (LIVER/GB)  Result Date: 04/04/2020 CLINICAL DATA:  69 year old female with possible cholecystitis. EXAM: ULTRASOUND ABDOMEN LIMITED RIGHT UPPER QUADRANT COMPARISON:  CT abdomen pelvis from 04/02/2020 and right upper quadrant ultrasounds from 05/01/2019 and 09/04/2019. FINDINGS: Gallbladder: The gallbladder is relatively decompressed with thickening of the gallbladder wall measuring up to 2.5 mm, significantly decreased from 09/04/2019 comparison. Layering sludge and a few small layering gallstones are again seen. Trace pericholecystic/perihepatic free fluid. Absent sonographic Murphy sign. Common bile duct: Diameter: 3.4 mm Liver: No focal lesion identified. Within normal limits in parenchymal echogenicity and echotexture. Portal vein is patent on color Doppler imaging with normal direction of blood flow towards the liver. Other: No perihepatic ascites. IMPRESSION: No convincing sonographic signs of acute cholecystitis. Chronic gallbladder wall thickening up to 2.5 mm, decreased from May 2021  comparison, most likely secondary to congestive heart failure although chronic cholecystitis is plausible. Similar appearing layering sludge and gallstones in a nondistended gallbladder. If clinical concern for acute cholecystitis persists, recommend HIDA scan for further evaluation. Ruthann Cancer, MD [facility] Electronically Signed   By: Ruthann Cancer MD   On: 04/04/2020 08:30      ASSESSMENT/PLAN    Loculated effusion along lateral right major fissure     - reviewed CT chest as above     - effusion is minimal at this time     - there is no clinical signs of pneumonia at this time,     -will obtain respiratory culture     -procalcitonin trend  -currently empirically on doxycycline and rocephin which is adequate  for CAP therapy  -I do not think there is enough fluid to have thoracentesis done nor is there evidence to suggest empyema  -recommend incenitive spirometry - educated patient on appropriate technique -patient is currently on room air speaking in full sentences in no distress   Bibasilar atelectasis   - please encourage patient to use IS every day several times each hour    Altered mental status with encephalopathy    - possible PRESS due to uncontrolled accelerated hypertension    - possible toxic metabolic encephalopathy due to ESRD   - ABG to rule out hypercapnic related AMS-12/24- reviewed and is adequate without need for additional intervention at this time    Thank you for allowing me to participate in the care of this patient.   Patient/Family are satisfied with care plan and all questions have been answered.  This document was prepared using Dragon voice recognition software and may include unintentional dictation errors.     Ottie Glazier, M.D.  Division of [place]

## 2020-04-14 NOTE — Progress Notes (Signed)
[facility]  ROUNDING NOTE   Subjective:   [name] was admitted to [facility] on 04/10/2020 for Slurred speech [R47.81] Altered mental status, unspecified altered mental status type [R41.82] AMS (altered mental status) [R41.82]  Patient seen earlier today.  Interactive but does not appear to be back to her normal mental status Able to answer a few simple questions   Objective:  Vital signs in last 24 hours:  Temp:  [97.8 F (36.6 C)-98.2 F (36.8 C)] 98 F (36.7 C) (12/24 1205) Pulse Rate:  [65-75] 70 (12/24 1205) Resp:  [16-18] 17 (12/24 1205) BP: (166-207)/(47-88) 187/65 (12/24 1205) SpO2:  [96 %-100 %] 96 % (12/24 1205)  Weight change:  Filed Weights   04/10/20 1106  Weight: 50 kg    Intake/Output: I/O last 3 completed shifts: In: 320.9 [P.O.:120; IV Piggyback:200.9] Out: -    Intake/Output this shift:  Total I/O In: 220 [P.O.:120; IV Piggyback:100] Out: -   Physical Exam: General:  Resting in bed, in no acute distress  Head:  Oral mucous membranes moist  Eyes:  Sclerae and conjunctivae clear  Lungs:  Respirations symmetrical and unlabored, lungs clear  Heart:  S1-S2, no rubs or gallops  Abdomen:  Soft, nontender, nondistended  Extremities:  bilateral BKA, no edema noted in upper legs  Neurologic:  Able to follow commands and respond to name     Access: LIJ permcath    Basic Metabolic Panel: Recent Labs  Lab 04/10/20 1127 04/10/20 1129 04/11/20 0403 04/12/20 0553 04/13/20 0405 04/14/20 0454  NA 140  --  139 138 136 140  K 4.8  --  4.6 3.7 4.1 4.4  CL 97*  --  99 99 101 100  CO2 28  --  24 26 25 25   GLUCOSE 186*  --  121* 79 73 61*  BUN 53*  --  57* 25* 37* 41*  CREATININE 9.33*  --  10.31* 6.32* 8.01* 9.10*  CALCIUM 8.9  --  8.7* 8.6* 8.1* 8.5*  MG  --  2.2  --  1.9 2.0 2.0  PHOS  --   --  4.2 3.3 4.1 4.2    Liver Function Tests: Recent Labs  Lab 04/10/20 1127 04/12/20 0553  AST 17 17  ALT 13 10  ALKPHOS 92 77   BILITOT 0.6 0.6  PROT 7.4 6.6  ALBUMIN 3.7 3.2*   No results for input(s): LIPASE, AMYLASE in the last 168 hours. Recent Labs  Lab 04/10/20 1129  AMMONIA 19    CBC: Recent Labs  Lab 04/10/20 1127 04/11/20 0403 04/12/20 0553 04/13/20 0405 04/14/20 0454  WBC 4.2 3.9* 4.8 4.4  --   NEUTROABS 3.0  --  3.5  --   --   HGB 13.4 12.1 11.3* 11.5* 11.6*  HCT 43.2 39.6 36.3 38.0 37.9  MCV 96.4 98.3 96.3 96.9  --   PLT 113* 93* 97* 95*  --     Cardiac Enzymes: No results for input(s): CKTOTAL, CKMB, CKMBINDEX, TROPONINI in the last 168 hours.  BNP: Invalid input(s): POCBNP  CBG: Recent Labs  Lab 04/13/20 1618 04/13/20 1651 04/13/20 1945 04/14/20 1202 04/14/20 1336  GLUCAP 46* 159* 70 57* 31*    Microbiology: Results for orders placed or performed during the hospital encounter of 04/10/20  Resp Panel by RT-PCR (Flu A&B, Covid) Nasopharyngeal Swab     Status: None   Collection Time: 04/10/20 12:58 PM   Specimen: Nasopharyngeal Swab; Nasopharyngeal(NP) swabs in vial transport medium  Result Value  Ref Range Status   SARS Coronavirus 2 by RT PCR NEGATIVE NEGATIVE Final    Comment: (NOTE) SARS-CoV-2 target nucleic acids are NOT DETECTED.  The SARS-CoV-2 RNA is generally detectable in upper respiratory specimens during the acute phase of infection. The lowest concentration of SARS-CoV-2 viral copies this assay can detect is 138 copies/mL. A negative result does not preclude SARS-Cov-2 infection and should not be used as the sole basis for treatment or other patient management decisions. A negative result may occur with  improper specimen collection/handling, submission of specimen other than nasopharyngeal swab, presence of viral mutation(s) within the areas targeted by this assay, and inadequate number of viral copies(<138 copies/mL). A negative result must be combined with clinical observations, patient history, and epidemiological information. The expected result is  Negative.  Fact Sheet for Patients:  EntrepreneurPulse.com.au  Fact Sheet for Healthcare Providers:  IncredibleEmployment.be  This test is no t yet approved or cleared by the [place] FDA and  has been authorized for detection and/or diagnosis of SARS-CoV-2 by FDA under an Emergency Use Authorization (EUA). This EUA will remain  in effect (meaning this test can be used) for the duration of the COVID-19 declaration under Section 564(b)(1) of the Act, 21 U.S.C.section 360bbb-3(b)(1), unless the authorization is terminated  or revoked sooner.       Influenza A by PCR NEGATIVE NEGATIVE Final   Influenza B by PCR NEGATIVE NEGATIVE Final    Comment: (NOTE) The Xpert Xpress SARS-CoV-2/FLU/RSV plus assay is intended as an aid in the diagnosis of influenza from Nasopharyngeal swab specimens and should not be used as a sole basis for treatment. Nasal washings and aspirates are unacceptable for Xpert Xpress SARS-CoV-2/FLU/RSV testing.  Fact Sheet for Patients: EntrepreneurPulse.com.au  Fact Sheet for Healthcare Providers: IncredibleEmployment.be  This test is not yet approved or cleared by the [place] FDA and has been authorized for detection and/or diagnosis of SARS-CoV-2 by FDA under an Emergency Use Authorization (EUA). This EUA will remain in effect (meaning this test can be used) for the duration of the COVID-19 declaration under Section 564(b)(1) of the Act, 21 U.S.C. section 360bbb-3(b)(1), unless the authorization is terminated or revoked.  Performed at [facility], [address] [place], [place] [address]   CULTURE, BLOOD (ROUTINE X 2) w Reflex to ID Panel     Status: None (Preliminary result)   Collection Time: 04/11/20  6:42 PM   Specimen: BLOOD  Result Value Ref Range Status   Specimen Description BLOOD BLOOD LEFT HAND  Final   Special Requests   Final    BOTTLES DRAWN  AEROBIC AND ANAEROBIC Blood Culture results may not be optimal due to an inadequate volume of blood received in culture bottles   Culture   Final    NO GROWTH 3 DAYS Performed at [facility], [address], [place], [place] [address]    Report Status PENDING  Incomplete  CULTURE, BLOOD (ROUTINE X 2) w Reflex to ID Panel     Status: None (Preliminary result)   Collection Time: 04/11/20  6:50 PM   Specimen: BLOOD  Result Value Ref Range Status   Specimen Description BLOOD BLOOD LEFT FOREARM  Final   Special Requests   Final    BOTTLES DRAWN AEROBIC AND ANAEROBIC Blood Culture adequate volume   Culture   Final    NO GROWTH 3 DAYS Performed at [facility], [address], [place], [place] [address]    Report Status PENDING  Incomplete  Coagulation Studies: No results for input(s): LABPROT, INR in the last 72 hours.  Urinalysis: No results for input(s): COLORURINE, LABSPEC, PHURINE, GLUCOSEU, HGBUR, BILIRUBINUR, KETONESUR, PROTEINUR, UROBILINOGEN, NITRITE, LEUKOCYTESUR in the last 72 hours.  Invalid input(s): APPERANCEUR    Imaging: No results found.   Medications:    Scheduled Meds: . apixaban  2.5 mg Oral BID  . aspirin EC  81 mg Oral Daily  . atorvastatin  80 mg Oral QHS  . brimonidine  1 drop Both Eyes TID  . carvedilol  37.5 mg Oral BID WC  . Chlorhexidine Gluconate Cloth  6 each Topical Q0600  . doxycycline  100 mg Oral Q12H  . fluticasone  2 spray Each Nare Daily  . hydrALAZINE  75 mg Oral Q8H  . insulin aspart  0-6 Units Subcutaneous TID WC  . ipratropium  2 spray Each Nare BID  . isosorbide mononitrate  60 mg Oral Daily  . levETIRAcetam  250 mg Oral Q M,W,F-HD  . levETIRAcetam  500 mg Oral Q12H  . losartan  25 mg Oral Daily  . mirtazapine  7.5 mg Oral QHS  . omega-3 acid ethyl esters  2 g Oral Daily  . pantoprazole  40 mg Oral Daily  . rOPINIRole  0.25 mg Oral QHS  . senna-docusate  1 tablet Oral BID  . sevelamer carbonate  1,600  mg Oral TID WC  . sodium chloride flush  3 mL Intravenous Q12H  . sodium chloride flush  3 mL Intravenous Q12H  . [START ON 04/20/2020] thiamine injection  100 mg Intravenous Daily  . timolol  1 drop Both Eyes TID  . triamcinolone   Topical BID  . valACYclovir  500 mg Oral Daily  . vitamin B-12  1,000 mcg Oral Daily   Continuous Infusions: . sodium chloride    . cefTRIAXone (ROCEPHIN)  IV Stopped (04/13/20 2114)  . levETIRAcetam    . thiamine injection 500 mg (04/14/20 0820)   Followed by  . [START ON 04/15/2020] thiamine injection     PRN Meds:.sodium chloride, hydrALAZINE, meclizine, ondansetron **OR** ondansetron (ZOFRAN) IV, polyvinyl alcohol, sodium chloride flush    Assessment/ Plan:  [name] is a 69 y.o. black female with end stage renal disease on hemodialysis, peripheral vascular disease status post bilateral BKA, diabetes mellitus type II, coronary artery disease status post CABG admitted to [facility] on 04/10/2020 for Slurred speech [R47.81] Altered mental status, unspecified altered mental status type [R41.82] AMS (altered mental status) [R41.82]  [facility] MWF [facility] [address] LIJ permcath 52kg  #  End Stage Renal Disease We will continue MWF schedule Plan for next dialysis today. Dialysis in chair if patient can tolerate it..  Otherwise, she can be seated in a chair in her room     # Anemia of chronic kidney disease Lab Results  Component Value Date   HGB 11.6 (L) 04/14/2020   No indication for Epogen  #Secondary Hyperparathyroidism:  Lab Results  Component Value Date   PTH 385 (H) 09/03/2019   CALCIUM 8.5 (L) 04/14/2020   PHOS 4.2 04/14/2020  Will continue monitoring bone mineral metabolism parameters  #Shingles   Patient is on valacyclovir About 1 inch area of crusting lesions on the left chest sidewall.  #Altered mental status EEG-no seizures seen Likely acute metabolic encephalopathy. Had an episode of altered mental status  during dialysis on Thursday.  She had decreased responsiveness but became responsive as soon as blood was returned     LOS: 4 Timoty Bourke  Candiss Norse 12/24/20211:52 PM

## 2020-04-14 NOTE — Progress Notes (Signed)
   04/12/20 0745  Assess: MEWS Score  Temp 98 F (36.7 C)  BP (!) 186/76  Pulse Rate 66  Resp (!) 28  Level of Consciousness Alert  SpO2 99 %  O2 Device Room Air  Assess: MEWS Score  MEWS Temp 0  MEWS Systolic 0  MEWS Pulse 0  MEWS RR 2  MEWS LOC 0  MEWS Score 2  MEWS Score Color Yellow  Assess: if the MEWS score is Yellow or Red  Were vital signs taken at a resting state? Yes  Focused Assessment No change from prior assessment (Unsure, this is first assessment by this nurse)  Early Detection of Sepsis Score *See Row Information* Low  MEWS guidelines implemented *See Row Information* Yes  Treat  MEWS Interventions Administered scheduled meds/treatments  Pain Scale 0-10  Pain Score 0  Take Vital Signs  Increase Vital Sign Frequency  Yellow: Q 2hr X 2 then Q 4hr X 2, if remains yellow, continue Q 4hrs  Escalate  MEWS: Escalate Yellow: discuss with charge nurse/RN and consider discussing with provider and RRT  Notify: Charge Nurse/RN  Name of Charge Nurse/RN Notified Brandi, RN  Date Charge Nurse/RN Notified 04/12/20  Time Charge Nurse/RN Notified 0745

## 2020-04-15 DIAGNOSIS — R410 Disorientation, unspecified: Secondary | ICD-10-CM | POA: Diagnosis not present

## 2020-04-15 LAB — BASIC METABOLIC PANEL
Anion gap: 11 (ref 5–15)
BUN: 17 mg/dL (ref 8–23)
CO2: 27 mmol/L (ref 22–32)
Calcium: 8.5 mg/dL — ABNORMAL LOW (ref 8.9–10.3)
Chloride: 102 mmol/L (ref 98–111)
Creatinine, Ser: 5.38 mg/dL — ABNORMAL HIGH (ref 0.44–1.00)
GFR, Estimated: 8 mL/min — ABNORMAL LOW (ref >60)
Glucose, Bld: 78 mg/dL (ref 70–99)
Potassium: 4 mmol/L (ref 3.5–5.1)
Sodium: 140 mmol/L (ref 135–145)

## 2020-04-15 LAB — GLUCOSE, CAPILLARY
Glucose-Capillary: 98 mg/dL (ref 70–99)
Glucose-Capillary: 100 mg/dL — ABNORMAL HIGH (ref 70–99)
Glucose-Capillary: 116 mg/dL — ABNORMAL HIGH (ref 70–99)
Glucose-Capillary: 117 mg/dL — ABNORMAL HIGH (ref 70–99)

## 2020-04-15 LAB — HEMOGLOBIN AND HEMATOCRIT, BLOOD
HCT: 36 % (ref 36.0–46.0)
Hemoglobin: 11 g/dL — ABNORMAL LOW (ref 12.0–15.0)

## 2020-04-15 NOTE — Progress Notes (Signed)
[facility]  ROUNDING NOTE   Subjective:   [name] was admitted to [facility] on 04/10/2020 for Slurred speech [R47.81] Altered mental status, unspecified altered mental status type [R41.82] AMS (altered mental status) [R41.82]  Patient is alert today.  Able to answer questions Denies any acute complaints or shortness of breath   Objective:  Vital signs in last 24 hours:  Temp:  [97.8 F (36.6 C)-98.5 F (36.9 C)] 98.2 F (36.8 C) (12/25 1100) Pulse Rate:  [62-73] 63 (12/25 1100) Resp:  [0-18] 16 (12/25 1100) BP: (117-201)/(61-102) 117/79 (12/25 1100) SpO2:  [94 %-99 %] 97 % (12/25 0000)  Weight change:  Filed Weights   04/10/20 1106  Weight: 50 kg    Intake/Output: I/O last 3 completed shifts: In: 640 [P.O.:240; IV Piggyback:400] Out: 500 [Other:500]   Intake/Output this shift:  Total I/O In: 3 [I.V.:3] Out: -   Physical Exam: General:  Resting in bed, in no acute distress  Head:  Oral mucous membranes moist  Eyes:  Sclerae and conjunctivae clear  Lungs:  Respirations symmetrical and unlabored, lungs clear  Heart:  S1-S2, no rubs or gallops  Abdomen:  Soft, nontender, nondistended  Extremities:  bilateral BKA, no edema noted in upper legs  Neurologic:  Able to follow commands and respond to name     Access: LIJ permcath    Basic Metabolic Panel: Recent Labs  Lab 04/10/20 1129 04/11/20 0403 04/12/20 0553 04/13/20 0405 04/14/20 0454 04/15/20 0414  NA  --  139 138 136 140 140  K  --  4.6 3.7 4.1 4.4 4.0  CL  --  99 99 101 100 102  CO2  --  24 26 25 25 27   GLUCOSE  --  121* 79 73 61* 78  BUN  --  57* 25* 37* 41* 17  CREATININE  --  10.31* 6.32* 8.01* 9.10* 5.38*  CALCIUM  --  8.7* 8.6* 8.1* 8.5* 8.5*  MG 2.2  --  1.9 2.0 2.0  --   PHOS  --  4.2 3.3 4.1 4.2  --     Liver Function Tests: Recent Labs  Lab 04/10/20 1127 04/12/20 0553  AST 17 17  ALT 13 10  ALKPHOS 92 77  BILITOT 0.6 0.6  PROT 7.4 6.6  ALBUMIN 3.7 3.2*    No results for input(s): LIPASE, AMYLASE in the last 168 hours. Recent Labs  Lab 04/10/20 1129  AMMONIA 19    CBC: Recent Labs  Lab 04/10/20 1127 04/11/20 0403 04/12/20 0553 04/13/20 0405 04/14/20 0454 04/15/20 0414  WBC 4.2 3.9* 4.8 4.4  --   --   NEUTROABS 3.0  --  3.5  --   --   --   HGB 13.4 12.1 11.3* 11.5* 11.6* 11.0*  HCT 43.2 39.6 36.3 38.0 37.9 36.0  MCV 96.4 98.3 96.3 96.9  --   --   PLT 113* 93* 97* 95*  --   --     Cardiac Enzymes: No results for input(s): CKTOTAL, CKMB, CKMBINDEX, TROPONINI in the last 168 hours.  BNP: Invalid input(s): POCBNP  CBG: Recent Labs  Lab 04/14/20 1336 04/14/20 1613 04/14/20 2019 04/15/20 0917 04/15/20 1140  GLUCAP 59* 165* 71 98 100*    Microbiology: Results for orders placed or performed during the hospital encounter of 04/10/20  Resp Panel by RT-PCR (Flu A&B, Covid) Nasopharyngeal Swab     Status: None   Collection Time: 04/10/20 12:58 PM   Specimen: Nasopharyngeal Swab; Nasopharyngeal(NP) swabs in  vial transport medium  Result Value Ref Range Status   SARS Coronavirus 2 by RT PCR NEGATIVE NEGATIVE Final    Comment: (NOTE) SARS-CoV-2 target nucleic acids are NOT DETECTED.  The SARS-CoV-2 RNA is generally detectable in upper respiratory specimens during the acute phase of infection. The lowest concentration of SARS-CoV-2 viral copies this assay can detect is 138 copies/mL. A negative result does not preclude SARS-Cov-2 infection and should not be used as the sole basis for treatment or other patient management decisions. A negative result may occur with  improper specimen collection/handling, submission of specimen other than nasopharyngeal swab, presence of viral mutation(s) within the areas targeted by this assay, and inadequate number of viral copies(<138 copies/mL). A negative result must be combined with clinical observations, patient history, and epidemiological information. The expected result is  Negative.  Fact Sheet for Patients:  EntrepreneurPulse.com.au  Fact Sheet for Healthcare Providers:  IncredibleEmployment.be  This test is no t yet approved or cleared by the [place] FDA and  has been authorized for detection and/or diagnosis of SARS-CoV-2 by FDA under an Emergency Use Authorization (EUA). This EUA will remain  in effect (meaning this test can be used) for the duration of the COVID-19 declaration under Section 564(b)(1) of the Act, 21 U.S.C.section 360bbb-3(b)(1), unless the authorization is terminated  or revoked sooner.       Influenza A by PCR NEGATIVE NEGATIVE Final   Influenza B by PCR NEGATIVE NEGATIVE Final    Comment: (NOTE) The Xpert Xpress SARS-CoV-2/FLU/RSV plus assay is intended as an aid in the diagnosis of influenza from Nasopharyngeal swab specimens and should not be used as a sole basis for treatment. Nasal washings and aspirates are unacceptable for Xpert Xpress SARS-CoV-2/FLU/RSV testing.  Fact Sheet for Patients: EntrepreneurPulse.com.au  Fact Sheet for Healthcare Providers: IncredibleEmployment.be  This test is not yet approved or cleared by the [place] FDA and has been authorized for detection and/or diagnosis of SARS-CoV-2 by FDA under an Emergency Use Authorization (EUA). This EUA will remain in effect (meaning this test can be used) for the duration of the COVID-19 declaration under Section 564(b)(1) of the Act, 21 U.S.C. section 360bbb-3(b)(1), unless the authorization is terminated or revoked.  Performed at [facility], [address] [place], [place] [address]   CULTURE, BLOOD (ROUTINE X 2) w Reflex to ID Panel     Status: None (Preliminary result)   Collection Time: 04/11/20  6:42 PM   Specimen: BLOOD  Result Value Ref Range Status   Specimen Description BLOOD BLOOD LEFT HAND  Final   Special Requests   Final    BOTTLES DRAWN  AEROBIC AND ANAEROBIC Blood Culture results may not be optimal due to an inadequate volume of blood received in culture bottles   Culture   Final    NO GROWTH 4 DAYS Performed at [facility], [address], [place], [place] [address]    Report Status PENDING  Incomplete  CULTURE, BLOOD (ROUTINE X 2) w Reflex to ID Panel     Status: None (Preliminary result)   Collection Time: 04/11/20  6:50 PM   Specimen: BLOOD  Result Value Ref Range Status   Specimen Description BLOOD BLOOD LEFT FOREARM  Final   Special Requests   Final    BOTTLES DRAWN AEROBIC AND ANAEROBIC Blood Culture adequate volume   Culture   Final    NO GROWTH 4 DAYS Performed at [facility], [address], [place], [place] [address]  Report Status PENDING  Incomplete    Coagulation Studies: No results for input(s): LABPROT, INR in the last 72 hours.  Urinalysis: No results for input(s): COLORURINE, LABSPEC, PHURINE, GLUCOSEU, HGBUR, BILIRUBINUR, KETONESUR, PROTEINUR, UROBILINOGEN, NITRITE, LEUKOCYTESUR in the last 72 hours.  Invalid input(s): APPERANCEUR    Imaging: No results found.   Medications:    Scheduled Meds: . apixaban  2.5 mg Oral BID  . aspirin EC  81 mg Oral Daily  . atorvastatin  80 mg Oral QHS  . brimonidine  1 drop Both Eyes TID  . carvedilol  37.5 mg Oral BID WC  . Chlorhexidine Gluconate Cloth  6 each Topical Q0600  . doxycycline  100 mg Oral Q12H  . fluticasone  2 spray Each Nare Daily  . hydrALAZINE  75 mg Oral Q8H  . insulin aspart  0-6 Units Subcutaneous TID WC  . ipratropium  2 spray Each Nare BID  . isosorbide mononitrate  60 mg Oral Daily  . levETIRAcetam  250 mg Oral Q M,W,F-HD  . levETIRAcetam  500 mg Oral Q12H  . losartan  25 mg Oral Daily  . mirtazapine  7.5 mg Oral QHS  . omega-3 acid ethyl esters  2 g Oral Daily  . pantoprazole  40 mg Oral Daily  . rOPINIRole  0.25 mg Oral QHS  . senna-docusate  1 tablet Oral BID  . sevelamer carbonate  1,600  mg Oral TID WC  . sodium chloride flush  3 mL Intravenous Q12H  . sodium chloride flush  3 mL Intravenous Q12H  . [START ON 04/20/2020] thiamine injection  100 mg Intravenous Daily  . timolol  1 drop Both Eyes TID  . triamcinolone   Topical BID  . valACYclovir  500 mg Oral Daily  . vitamin B-12  1,000 mcg Oral Daily   Continuous Infusions: . sodium chloride    . cefTRIAXone (ROCEPHIN)  IV Stopped (04/14/20 2316)  . levETIRAcetam    . thiamine injection     PRN Meds:.sodium chloride, hydrALAZINE, meclizine, ondansetron **OR** ondansetron (ZOFRAN) IV, polyvinyl alcohol, sodium chloride flush    Assessment/ Plan:  [name] is a 69 y.o. black female with end stage renal disease on hemodialysis, peripheral vascular disease status post bilateral BKA, diabetes mellitus type II, coronary artery disease status post CABG admitted to [facility] on 04/10/2020 for Slurred speech [R47.81] Altered mental status, unspecified altered mental status type [R41.82] AMS (altered mental status) [R41.82]  [facility] MWF [facility] [facility] permcath [phone]  #  End Stage Renal Disease We will continue MWF schedule Plan for next dialysis on Monday Dialysis in chair if patient can tolerate it..     # Anemia of chronic kidney disease Lab Results  Component Value Date   HGB 11.0 (L) 04/15/2020   No indication for Epogen  #Secondary Hyperparathyroidism:  Lab Results  Component Value Date   PTH 385 (H) 09/03/2019   CALCIUM 8.5 (L) 04/15/2020   PHOS 4.2 04/14/2020  Will continue monitoring bone mineral metabolism parameters  #Shingles   Patient is on valacyclovir  lesions on the left chest sidewall.  #Altered mental status EEG-no seizures seen Likely acute metabolic encephalopathy. Had an episode of altered mental status during dialysis on Thursday.  She had decreased responsiveness but became responsive as soon as blood was returned Tolerated dialysis well on Saturday.  No acute  events     LOS: 5 Iolani Twilley 12/25/20211:12 PM

## 2020-04-15 NOTE — Progress Notes (Signed)
PROGRESS NOTE    Stephanie Ellis  YIR:[id number] DOB: [birth year] DOA: 04/10/2020 PCP: Pcp, No   Brief Narrative:  This 69 y.o.femalewith PMH significant fordiabetes mellitus , ESRD on HD (M/W/F) , hypertension, PAD s/p bilateral BKA,coronary artery disease status post CABG who was brought to the emergency room by EMS for evaluation of mental status changes. Patient was recently discharged from the hospital on 04/06/20.During that hospitalization she had a brief PEA arrest aftershe received hydromorphone and recovered immediately following administration of Narcan. Her daughter states that since her discharge she has not been herusual self and has been acting very erratic. At baseline patient is able to take care of herself and activities of daily living such as transfers, grooming and feeding herself. Since her discharge she has been very weak and has been unable to feed herself or transfer to her wheelchair. Family is also concerned that her speech is slurred.Patient refused to go to dialysis on the morning of her admission and so EMS was called.  Patient is admitted with acute encephalopathy without clear cause.  MRI brain no acute infarction, hemorrhage.  EEG: no evidence of seizures.  Ammonia level normal.  Neurology is consulted.  MRI does not show any evidence of stroke or anoxic brain injury.  There is no indication for LP at this time. Recommended continued thiamine treatment.  Nephrology consulted for continuation of hemodialysis.  Pulmonology consulted for mild loculated effusion on CT chest.  There is no signs of empyema or effusion is very small for thoracocentesis.  Continue empirical treatment for CAP and encourage incentive spirometry.  Her mental status has been improving.  Assessment & Plan:   Principal Problem:   AMS (altered mental status) Active Problems:   ESRD (end stage renal disease) on dialysis ([place])   Type II diabetes mellitus with complication (HCC)    Hypertension   Seizure disorder (HCC)   Cirrhosis, cardiac   Acute Metabolic Encephalopathy >> Fluctuating Unclear etiology, at time of admission - ddx includes anoxic brain injury related to PEA arrest at last hospitalization, encephalitis in setting of shingles, hypertensive encephalopathy. MRI brian without acute findings (chronic microvascular ischemic changes and few small chronic infarcts) EEG: no evidence of seizures. Neurology consulted.  Recommended to continue thiamine.  Keppra level 22.59  Ammonia level normal.  Vitamin B12 2900. Blood cultures no growth so far. Mental status is improving.  She is alert and oriented x 2. ABG shows normal PCO2.  MRI ruled out anoxic brain injury. Patient could not complete hemodialysis 12/23.  She became less responsive during dialysis so it was held in between. Patient underwent hemodialysis on 12/24.  She had hemodialysis while sitting in the chair.  Mild Loculated Effusion : Assume this could be pneumonia.   Continue ceftriaxone and doxycycline for CAP(7 days) Blood cultures no growth so far. Pulmonology consulted, recommended to continue treatment for CAP.   There is no empyema and effusion is very small for thoracocentesis. Encourage incentive spirometry.  Physical deconditioning Most likely secondary to recent hospital stay. PT recommended SNF. Continue fall precautions.  Hypertensive urgency : Home blood pressure medication resumed.  Carvedilol, hydralazine and Imdur. Blood pressure still remains elevated with increase in Imdur to 60 mg daily Hydralazine increased to 75 mg every 8 hrs. Added losartan 25 mg daily. IV hydralazine prn for SBP > 500  X3GH with complications of end-stage renal disease on hemodialysis Maintain carb consistent diet. Glycemic control with sliding scale insulin. Nephrology consulted for renal replacement therapy. Patient could not  complete hemodialysis 12/23.  She became less responsive during  dialysis so it was held in between. Patient completed hemodialysis on 12/24 then continued on MWF schedule  Chronic combined systolic and diastolic dysfunction CHF Patient's last known LVEF is about 35 to 40% from an echocardiogram done in 2019 Stable and not acutely exacerbated. Volume management per renal.  Seizure disorder Continue Keppra. Continue seizure precautions EEG: no evidence of seizures.  Thrombocytopenia Chronic, continue to monitor.  Peripheral arterial disease Status post bilateral BKA Continue statin, apixaban and carvedilol  Depression Continue Remeron.  Constipation: Add Senokot and Colace   DVT prophylaxis: Eliquis Code Status: Full code Family Communication: No Family in the room Disposition Plan:   Status is: Inpatient  Remains inpatient appropriate because:Inpatient level of care appropriate due to severity of illness   Dispo: The patient is from: Home              Anticipated d/c is to:  SNF              Anticipated d/c date is:  >> 3 days              Patient currently is not medically stable to d/c.  Consultants:   Pulmonology  Nephrology  Neurology  Procedures: EEG  Antimicrobials: Anti-infectives (From admission, onward)   Start     Dose/Rate Route Frequency Ordered Stop   04/11/20 1800  valACYclovir (VALTREX) tablet 500 mg        500 mg Oral Daily 04/10/20 2043     04/11/20 1330  doxycycline (VIBRA-TABS) tablet 100 mg        100 mg Oral Every 12 hours 04/11/20 1244 04/18/20 0959   04/11/20 1300  cefTRIAXone (ROCEPHIN) 2 g in sodium chloride 0.9 % 100 mL IVPB        2 g 200 mL/hr over 30 Minutes Intravenous Every 24 hours 04/11/20 1239 04/18/20 1259   04/11/20 1300  azithromycin (ZITHROMAX) 500 mg in sodium chloride 0.9 % 250 mL IVPB  Status:  Discontinued        500 mg 250 mL/hr over 60 Minutes Intravenous Every 24 hours 04/11/20 1239 04/11/20 1244   04/10/20 1500  valACYclovir (VALTREX) tablet 500 mg  Status:   Discontinued        500 mg Oral Daily 04/10/20 1446 04/10/20 2042      Subjective: Patient was seen and examined at bedside.  Overnight events noted.   Patient completed hemodialysis on 12/24 in the chair.  She still appears confused in the morning.  Her left arm swelling is improving.  Her blood pressure is still elevated despite increase in blood pressure medications  Objective: Vitals:   04/14/20 2130 04/15/20 0000 04/15/20 0500 04/15/20 0900  BP: (!) 201/81 (!) 173/71 (!) 163/65 (!) 154/68  Pulse: 67 62 65 62  Resp: 16 16 16 18   Temp: 98.5 F (36.9 C) 98.3 F (36.8 C) 98.3 F (36.8 C) 98.5 F (36.9 C)  TempSrc: Oral Oral Oral Oral  SpO2: 95% 97%    Weight:      Height:        Intake/Output Summary (Last 24 hours) at 04/15/2020 1039 Last data filed at 04/15/2020 0235 Gross per 24 hour  Intake 300 ml  Output 500 ml  Net -200 ml   Filed Weights   04/10/20 1106  Weight: 50 kg    Examination:  General exam: Appears calm and comfortable, not in any distress. Respiratory system: Clear to auscultation. Respiratory effort  normal. Cardiovascular system: S1 & S2 heard, RRR. No JVD, murmurs, rubs, gallops or clicks. No pedal edema. Gastrointestinal system: Abdomen is nondistended, soft and nontender. No organomegaly or masses felt. Normal bowel sounds heard. Central nervous system: Alert and oriented x 2. No focal neurological deficits. Extremities: Bilateral BKA.  Left arm swelling improving Skin: No rashes, lesions or ulcers. Psychiatry: Judgement and insight appear normal. Mood & affect appropriate.     Data Reviewed: I have personally reviewed following labs and imaging studies  CBC: Recent Labs  Lab 04/10/20 1127 04/11/20 0403 04/12/20 0553 04/13/20 0405 04/14/20 0454 04/15/20 0414  WBC 4.2 3.9* 4.8 4.4  --   --   NEUTROABS 3.0  --  3.5  --   --   --   HGB 13.4 12.1 11.3* 11.5* 11.6* 11.0*  HCT 43.2 39.6 36.3 38.0 37.9 36.0  MCV 96.4 98.3 96.3 96.9  --    --   PLT 113* 93* 97* 95*  --   --    Basic Metabolic Panel: Recent Labs  Lab 04/10/20 1129 04/11/20 0403 04/12/20 0553 04/13/20 0405 04/14/20 0454 04/15/20 0414  NA  --  139 138 136 140 140  K  --  4.6 3.7 4.1 4.4 4.0  CL  --  99 99 101 100 102  CO2  --  24 26 25 25 27   GLUCOSE  --  121* 79 73 61* 78  BUN  --  57* 25* 37* 41* 17  CREATININE  --  10.31* 6.32* 8.01* 9.10* 5.38*  CALCIUM  --  8.7* 8.6* 8.1* 8.5* 8.5*  MG 2.2  --  1.9 2.0 2.0  --   PHOS  --  4.2 3.3 4.1 4.2  --    GFR: Estimated Creatinine Clearance: 5.7 mL/min (A) (by C-G formula based on SCr of 5.38 mg/dL (H)). Liver Function Tests: Recent Labs  Lab 04/10/20 1127 04/12/20 0553  AST 17 17  ALT 13 10  ALKPHOS 92 77  BILITOT 0.6 0.6  PROT 7.4 6.6  ALBUMIN 3.7 3.2*   No results for input(s): LIPASE, AMYLASE in the last 168 hours. Recent Labs  Lab 04/10/20 1129  AMMONIA 19   Coagulation Profile: No results for input(s): INR, PROTIME in the last 168 hours. Cardiac Enzymes: No results for input(s): CKTOTAL, CKMB, CKMBINDEX, TROPONINI in the last 168 hours. BNP (last 3 results) No results for input(s): PROBNP in the last 8760 hours. HbA1C: No results for input(s): HGBA1C in the last 72 hours. CBG: Recent Labs  Lab 04/14/20 1202 04/14/20 1336 04/14/20 1613 04/14/20 2019 04/15/20 0917  GLUCAP 57* 59* 165* 71 98   Lipid Profile: No results for input(s): CHOL, HDL, LDLCALC, TRIG, CHOLHDL, LDLDIRECT in the last 72 hours. Thyroid Function Tests: No results for input(s): TSH, T4TOTAL, FREET4, T3FREE, THYROIDAB in the last 72 hours. Anemia Panel: No results for input(s): VITAMINB12, FOLATE, FERRITIN, TIBC, IRON, RETICCTPCT in the last 72 hours. Sepsis Labs: Recent Labs  Lab 04/12/20 0553  PROCALCITON 0.37    Recent Results (from the past 240 hour(s))  Resp Panel by RT-PCR (Flu A&B, Covid) Nasopharyngeal Swab     Status: None   Collection Time: 04/10/20 12:58 PM   Specimen: Nasopharyngeal  Swab; Nasopharyngeal(NP) swabs in vial transport medium  Result Value Ref Range Status   SARS Coronavirus 2 by RT PCR NEGATIVE NEGATIVE Final    Comment: (NOTE) SARS-CoV-2 target nucleic acids are NOT DETECTED.  The SARS-CoV-2 RNA is generally detectable in upper respiratory specimens  during the acute phase of infection. The lowest concentration of SARS-CoV-2 viral copies this assay can detect is 138 copies/mL. A negative result does not preclude SARS-Cov-2 infection and should not be used as the sole basis for treatment or other patient management decisions. A negative result may occur with  improper specimen collection/handling, submission of specimen other than nasopharyngeal swab, presence of viral mutation(s) within the areas targeted by this assay, and inadequate number of viral copies(<138 copies/mL). A negative result must be combined with clinical observations, patient history, and epidemiological information. The expected result is Negative.  Fact Sheet for Patients:  EntrepreneurPulse.com.au  Fact Sheet for Healthcare Providers:  IncredibleEmployment.be  This test is no t yet approved or cleared by the [place] FDA and  has been authorized for detection and/or diagnosis of SARS-CoV-2 by FDA under an Emergency Use Authorization (EUA). This EUA will remain  in effect (meaning this test can be used) for the duration of the COVID-19 declaration under Section 564(b)(1) of the Act, 21 U.S.C.section 360bbb-3(b)(1), unless the authorization is terminated  or revoked sooner.       Influenza A by PCR NEGATIVE NEGATIVE Final   Influenza B by PCR NEGATIVE NEGATIVE Final    Comment: (NOTE) The Xpert Xpress SARS-CoV-2/FLU/RSV plus assay is intended as an aid in the diagnosis of influenza from Nasopharyngeal swab specimens and should not be used as a sole basis for treatment. Nasal washings and aspirates are unacceptable for Xpert Xpress  SARS-CoV-2/FLU/RSV testing.  Fact Sheet for Patients: EntrepreneurPulse.com.au  Fact Sheet for Healthcare Providers: IncredibleEmployment.be  This test is not yet approved or cleared by the [place] FDA and has been authorized for detection and/or diagnosis of SARS-CoV-2 by FDA under an Emergency Use Authorization (EUA). This EUA will remain in effect (meaning this test can be used) for the duration of the COVID-19 declaration under Section 564(b)(1) of the Act, 21 U.S.C. section 360bbb-3(b)(1), unless the authorization is terminated or revoked.  Performed at [facility], [address] [place], [place] [address]   CULTURE, BLOOD (ROUTINE X 2) w Reflex to ID Panel     Status: None (Preliminary result)   Collection Time: 04/11/20  6:42 PM   Specimen: BLOOD  Result Value Ref Range Status   Specimen Description BLOOD BLOOD LEFT HAND  Final   Special Requests   Final    BOTTLES DRAWN AEROBIC AND ANAEROBIC Blood Culture results may not be optimal due to an inadequate volume of blood received in culture bottles   Culture   Final    NO GROWTH 4 DAYS Performed at [facility], [address] [place], [place] [address]    Report Status PENDING  Incomplete  CULTURE, BLOOD (ROUTINE X 2) w Reflex to ID Panel     Status: None (Preliminary result)   Collection Time: 04/11/20  6:50 PM   Specimen: BLOOD  Result Value Ref Range Status   Specimen Description BLOOD BLOOD LEFT FOREARM  Final   Special Requests   Final    BOTTLES DRAWN AEROBIC AND ANAEROBIC Blood Culture adequate volume   Culture   Final    NO GROWTH 4 DAYS Performed at [facility], [address] [place], [place] [address]    Report Status PENDING  Incomplete     Radiology Studies: No results found. Scheduled Meds: . apixaban  2.5 mg Oral BID  . aspirin EC  81 mg Oral Daily  . atorvastatin  80 mg Oral QHS  . brimonidine  1 drop Both Eyes TID   . carvedilol  37.5 mg Oral BID WC  . Chlorhexidine Gluconate Cloth  6 each Topical Q0600  . doxycycline  100 mg Oral Q12H  . fluticasone  2 spray Each Nare Daily  . hydrALAZINE  75 mg Oral Q8H  . insulin aspart  0-6 Units Subcutaneous TID WC  . ipratropium  2 spray Each Nare BID  . isosorbide mononitrate  60 mg Oral Daily  . levETIRAcetam  250 mg Oral Q M,W,F-HD  . levETIRAcetam  500 mg Oral Q12H  . losartan  25 mg Oral Daily  . mirtazapine  7.5 mg Oral QHS  . omega-3 acid ethyl esters  2 g Oral Daily  . pantoprazole  40 mg Oral Daily  . rOPINIRole  0.25 mg Oral QHS  . senna-docusate  1 tablet Oral BID  . sevelamer carbonate  1,600 mg Oral TID WC  . sodium chloride flush  3 mL Intravenous Q12H  . sodium chloride flush  3 mL Intravenous Q12H  . [START ON 04/20/2020] thiamine injection  100 mg Intravenous Daily  . timolol  1 drop Both Eyes TID  . triamcinolone   Topical BID  . valACYclovir  500 mg Oral Daily  . vitamin B-12  1,000 mcg Oral Daily   Continuous Infusions: . sodium chloride    . cefTRIAXone (ROCEPHIN)  IV Stopped (04/14/20 2316)  . levETIRAcetam    . thiamine injection       LOS: 5 days    Time spent: 25 mins.    Shawna Clamp, MD [facility]   If 7PM-7AM, please contact night-coverage

## 2020-04-16 DIAGNOSIS — R410 Disorientation, unspecified: Secondary | ICD-10-CM | POA: Diagnosis not present

## 2020-04-16 LAB — CBC
HCT: 37.6 % (ref 36.0–46.0)
Hemoglobin: 11.6 g/dL — ABNORMAL LOW (ref 12.0–15.0)
MCH: 30.2 pg (ref 26.0–34.0)
MCHC: 30.9 g/dL (ref 30.0–36.0)
MCV: 97.9 fL (ref 80.0–100.0)
Platelets: 77 10*3/uL — ABNORMAL LOW (ref 150–400)
RBC: 3.84 MIL/uL — ABNORMAL LOW (ref 3.87–5.11)
RDW: 16.5 % — ABNORMAL HIGH (ref 11.5–15.5)
WBC: 4.5 10*3/uL (ref 4.0–10.5)
nRBC: 0 % (ref 0.0–0.2)

## 2020-04-16 LAB — HEPATITIS B SURFACE ANTIGEN: Hepatitis B Surface Ag: NONREACTIVE

## 2020-04-16 LAB — GLUCOSE, CAPILLARY
Glucose-Capillary: 70 mg/dL (ref 70–99)
Glucose-Capillary: 77 mg/dL (ref 70–99)
Glucose-Capillary: 124 mg/dL — ABNORMAL HIGH (ref 70–99)
Glucose-Capillary: 125 mg/dL — ABNORMAL HIGH (ref 70–99)
Glucose-Capillary: 107 mg/dL — ABNORMAL HIGH (ref 70–99)

## 2020-04-16 LAB — CULTURE, BLOOD (ROUTINE X 2)
Culture: NO GROWTH
Culture: NO GROWTH
Special Requests: ADEQUATE

## 2020-04-16 NOTE — Progress Notes (Signed)
Physical Therapy Treatment Patient Details Name: Stephanie Ellis MRN: [id number] DOB: [birth year] Today's Date: 04/16/2020    History of Present Illness Stephanie Ellis is a 29yoF who comes [facility] on 12/20 c AMS. PMH: DM, ESRD on HD MWF, HTN, PAD s/p bilateral BKA, CAD, CAD s/p CABG, left eye blindnss, heavy right eye visual impairment.    PT Comments    Patient is in bed upon PT arrival having just finished getting a bath and bed change. Nursing and patient agreeable to bedlevel exercises. Patient tolerated LE strengthening interventions in supine for 2 sets of 10 reps of each exercise well. All needs are met and bed alarm set. Current POC remains appropriate at this time.     Follow Up Recommendations  SNF     Equipment Recommendations  None recommended by PT    Recommendations for Other Services       Precautions / Restrictions Precautions Precautions: Fall    Mobility  Bed Mobility               General bed mobility comments: not able to be tested this session per nursing.  Transfers                    Ambulation/Gait                 Stairs             Wheelchair Mobility    Modified Rankin (Stroke Patients Only)       Balance                                            Cognition Arousal/Alertness: Awake/alert Behavior During Therapy: WFL for tasks assessed/performed Overall Cognitive Status: Within Functional Limits for tasks assessed                                        Exercises General Exercises - Lower Extremity Quad Sets: Strengthening;Both;10 reps;Supine Gluteal Sets: Strengthening;Both;10 reps;Supine Short Arc Quad: Strengthening;Both;10 reps;Supine Hip ABduction/ADduction: Strengthening;Both;10 reps;Supine    General Comments        Pertinent Vitals/Pain Pain Assessment: 0-10 Pain Score: 2  Pain Location: bilateral residual limbs Pain Descriptors / Indicators: Aching Pain  Intervention(s): Monitored during session    Home Living                      Prior Function            PT Goals (current goals can now be found in the care plan section) Acute Rehab PT Goals Patient Stated Goal: to improve mobility Progress towards PT goals: Progressing toward goals    Frequency    Min 2X/week      PT Plan Current plan remains appropriate    Co-evaluation              AM-PAC PT "6 Clicks" Mobility   Outcome Measure  Help needed turning from your back to your side while in a flat bed without using bedrails?: A Lot Help needed moving from lying on your back to sitting on the side of a flat bed without using bedrails?: A Lot Help needed moving to and from a bed to a chair (including a wheelchair)?: Total Help needed standing up from  a chair using your arms (e.g., wheelchair or bedside chair)?: Total Help needed to walk in hospital room?: Total Help needed climbing 3-5 steps with a railing? : Total 6 Click Score: 8    End of Session   Activity Tolerance: Patient tolerated treatment well;Treatment limited secondary to medical complications (Comment) Patient left: in bed;with call bell/phone within reach;with bed alarm set Nurse Communication: Mobility status PT Visit Diagnosis: Difficulty in walking, not elsewhere classified (R26.2);Other abnormalities of gait and mobility (R26.89);Muscle weakness (generalized) (M62.81);Other symptoms and signs involving the nervous system (I09.735)     Time: 3299-2426 PT Time Calculation (min) (ACUTE ONLY): 10 min  Charges:  $Therapeutic Exercise: 8-22 mins            Janna Arch, PT, DPT            04/16/2020, 3:45 PM

## 2020-04-16 NOTE — Progress Notes (Signed)
PROGRESS NOTE    Stephanie Ellis  ZOX:[id number] DOB: 1950-09-25 DOA: 04/10/2020 PCP: Pcp, No   Brief Narrative:  This 69 y.o.femalewith PMH significant fordiabetes mellitus , ESRD on HD (M/W/F) , hypertension, PAD s/p bilateral BKA,coronary artery disease status post CABG who was brought to the emergency room by EMS for evaluation of mental status changes. Patient was recently discharged from the hospital on 04/06/20.During that hospitalization she had a brief PEA arrest aftershe received hydromorphone and recovered immediately following administration of Narcan. Her daughter states that since her discharge she has not been herusual self and has been acting very erratic. At baseline patient is able to take care of herself and activities of daily living such as transfers, grooming and feeding herself. Since her discharge she has been very weak and has been unable to feed herself or transfer to her wheelchair. Family is also concerned that her speech is slurred.Patient refused to go to dialysis on the morning of her admission and so EMS was called.  Patient is admitted with acute encephalopathy without clear cause.  MRI brain no acute infarction, hemorrhage.  EEG: no evidence of seizures.  Ammonia level normal.  Neurology is consulted.  MRI does not show any evidence of stroke or anoxic brain injury.  There is no indication for LP at this time. Recommended continued thiamine treatment.  Nephrology consulted for continuation of hemodialysis.  Pulmonology consulted for mild loculated effusion on CT chest.  There is no signs of empyema and effusion is very small for thoracocentesis.  Continue empirical treatment for CAP and encourage incentive spirometry.  Her mental status has been improving.  Assessment & Plan:   Principal Problem:   AMS (altered mental status) Active Problems:   ESRD (end stage renal disease) on dialysis ([place])   Type II diabetes mellitus with complication (HCC)    Hypertension   Seizure disorder (HCC)   Cirrhosis, cardiac   Acute Metabolic Encephalopathy >> Fluctuating Unclear etiology, at time of admission - ddx includes anoxic brain injury related to PEA arrest at last hospitalization, encephalitis in setting of shingles, hypertensive encephalopathy. MRI brian without acute findings (chronic microvascular ischemic changes and few small chronic infarcts) EEG: no evidence of seizures. Neurology consulted.  Recommended to continue thiamine.  Keppra level 22.59  Ammonia level normal.  Vitamin B12 2900. Blood cultures no growth so far. Mental status is improving.  She is alert and oriented x 2. ABG shows normal PCO2.  MRI ruled out anoxic brain injury. Patient could not complete hemodialysis 12/23.  She became less responsive during dialysis so it was held in between. Patient completed  hemodialysis on 12/24.  She had hemodialysis while sitting in the chair. She will continue HD on MWF schedule.  Mild Loculated Effusion : Assume this could be pneumonia.   Continue ceftriaxone and doxycycline for CAP( 7 days) Blood cultures; no growth so far. Pulmonology consulted, recommended to continue treatment for CAP.   There is no empyema and effusion is very small for thoracocentesis. Encourage incentive spirometry.  Physical deconditioning Most likely secondary to recent hospital stay. PT recommended SNF. Continue fall precautions.  Hypertensive urgency : Improving Home blood pressure medication resumed.  Carvedilol, hydralazine and Imdur. Blood pressure still remained elevated with increase in Imdur to 60 mg daily Hydralazine increased to 75 mg every 8 hrs. Added losartan 25 mg daily. IV hydralazine prn for SBP > 180 Finally blood pressure is improving and in a reasonable range.  W1XB with complications of end-stage renal disease on  hemodialysis Maintain carb consistent diet. Glycemic control with sliding scale insulin. Nephrology consulted  for renal replacement therapy. Patient could not complete hemodialysis 12/23.  She became less responsive during dialysis so it was held in between. Patient completed hemodialysis on 12/24 then will be continued on MWF schedule  Chronic combined systolic and diastolic dysfunction CHF Patient's last known LVEF is about 35 to 40% from an echocardiogram done in 2019 Stable and not acutely exacerbated. Volume management per renal.  Seizure disorder Continue Keppra. Continue seizure precautions. EEG: No evidence of seizures.  Thrombocytopenia Chronic, continue to monitor.  Peripheral arterial disease Status post bilateral BKA Continue statin, apixaban and carvedilol  Depression Continue Remeron.  Constipation: Add Senokot and Colace   DVT prophylaxis: Eliquis Code Status: Full code Family Communication: No Family in the room Disposition Plan:   Status is: Inpatient  Remains inpatient appropriate because:Inpatient level of care appropriate due to severity of illness   Dispo: The patient is from: Home              Anticipated d/c is to:  SNF              Anticipated d/c date is:  >> 2 days.              Patient currently is not medically stable to d/c.  Consultants:   Pulmonology  Nephrology  Neurology  Procedures: EEG  Antimicrobials: Anti-infectives (From admission, onward)   Start     Dose/Rate Route Frequency Ordered Stop   04/11/20 1800  valACYclovir (VALTREX) tablet 500 mg        500 mg Oral Daily 04/10/20 2043     04/11/20 1330  doxycycline (VIBRA-TABS) tablet 100 mg        100 mg Oral Every 12 hours 04/11/20 1244 04/18/20 0959   04/11/20 1300  cefTRIAXone (ROCEPHIN) 2 g in sodium chloride 0.9 % 100 mL IVPB        2 g 200 mL/hr over 30 Minutes Intravenous Every 24 hours 04/11/20 1239 04/18/20 1259   04/11/20 1300  azithromycin (ZITHROMAX) 500 mg in sodium chloride 0.9 % 250 mL IVPB  Status:  Discontinued        500 mg 250 mL/hr over 60 Minutes  Intravenous Every 24 hours 04/11/20 1239 04/11/20 1244   04/10/20 1500  valACYclovir (VALTREX) tablet 500 mg  Status:  Discontinued        500 mg Oral Daily 04/10/20 1446 04/10/20 2042      Subjective: Patient was seen and examined at bedside.  Overnight events noted.   Patient still appears confused in the morning, it seems this is her baseline. Her left arm swelling is improving.  Her blood pressure is improving and is in a reasonable range.  Objective: Vitals:   04/15/20 1500 04/15/20 1900 04/16/20 0635 04/16/20 0900  BP: (!) 159/71 136/76 (!) 158/70 (!) 142/55  Pulse: 60 98 66 63  Resp: 16 20 16 16   Temp: 97.6 F (36.4 C) 98 F (36.7 C) 98.4 F (36.9 C) 98.7 F (37.1 C)  TempSrc: Oral Oral Oral Oral  SpO2:  90% 97% 95%  Weight:      Height:        Intake/Output Summary (Last 24 hours) at 04/16/2020 1052 Last data filed at 04/16/2020 0214 Gross per 24 hour  Intake 151.7 ml  Output 0 ml  Net 151.7 ml   Filed Weights   04/10/20 1106  Weight: 50 kg    Examination:  General  exam: Appears calm and comfortable, not in any distress. Respiratory system: Clear to auscultation. Respiratory effort normal. Cardiovascular system: S1 & S2 heard, RRR. No JVD, murmurs, rubs, gallops or clicks. No pedal edema. Gastrointestinal system: Abdomen is nondistended, soft and nontender. No organomegaly or masses felt. Normal bowel sounds heard. Central nervous system: Alert and oriented x 2. No focal neurological deficits. Extremities: Bilateral BKA.  Left arm swelling improving Skin: No rashes, lesions or ulcers. Psychiatry: Judgement and insight appear normal. Mood & affect appropriate.     Data Reviewed: I have personally reviewed following labs and imaging studies  CBC: Recent Labs  Lab 04/10/20 1127 04/11/20 0403 04/12/20 0553 04/13/20 0405 04/14/20 0454 04/15/20 0414 04/16/20 0439  WBC 4.2 3.9* 4.8 4.4  --   --  4.5  NEUTROABS 3.0  --  3.5  --   --   --   --   HGB  13.4 12.1 11.3* 11.5* 11.6* 11.0* 11.6*  HCT 43.2 39.6 36.3 38.0 37.9 36.0 37.6  MCV 96.4 98.3 96.3 96.9  --   --  97.9  PLT 113* 93* 97* 95*  --   --  77*   Basic Metabolic Panel: Recent Labs  Lab 04/10/20 1129 04/11/20 0403 04/12/20 0553 04/13/20 0405 04/14/20 0454 04/15/20 0414  NA  --  139 138 136 140 140  K  --  4.6 3.7 4.1 4.4 4.0  CL  --  99 99 101 100 102  CO2  --  24 26 25 25 27   GLUCOSE  --  121* 79 73 61* 78  BUN  --  57* 25* 37* 41* 17  CREATININE  --  10.31* 6.32* 8.01* 9.10* 5.38*  CALCIUM  --  8.7* 8.6* 8.1* 8.5* 8.5*  MG 2.2  --  1.9 2.0 2.0  --   PHOS  --  4.2 3.3 4.1 4.2  --    GFR: Estimated Creatinine Clearance: 5.7 mL/min (A) (by C-G formula based on SCr of 5.38 mg/dL (H)). Liver Function Tests: Recent Labs  Lab 04/10/20 1127 04/12/20 0553  AST 17 17  ALT 13 10  ALKPHOS 92 77  BILITOT 0.6 0.6  PROT 7.4 6.6  ALBUMIN 3.7 3.2*   No results for input(s): LIPASE, AMYLASE in the last 168 hours. Recent Labs  Lab 04/10/20 1129  AMMONIA 19   Coagulation Profile: No results for input(s): INR, PROTIME in the last 168 hours. Cardiac Enzymes: No results for input(s): CKTOTAL, CKMB, CKMBINDEX, TROPONINI in the last 168 hours. BNP (last 3 results) No results for input(s): PROBNP in the last 8760 hours. HbA1C: No results for input(s): HGBA1C in the last 72 hours. CBG: Recent Labs  Lab 04/15/20 1140 04/15/20 1636 04/15/20 1935 04/16/20 0848 04/16/20 0928  GLUCAP 100* 116* 117* 70 77   Lipid Profile: No results for input(s): CHOL, HDL, LDLCALC, TRIG, CHOLHDL, LDLDIRECT in the last 72 hours. Thyroid Function Tests: No results for input(s): TSH, T4TOTAL, FREET4, T3FREE, THYROIDAB in the last 72 hours. Anemia Panel: No results for input(s): VITAMINB12, FOLATE, FERRITIN, TIBC, IRON, RETICCTPCT in the last 72 hours. Sepsis Labs: Recent Labs  Lab 04/12/20 0553  PROCALCITON 0.37    Recent Results (from the past 240 hour(s))  Resp Panel by  RT-PCR (Flu A&B, Covid) Nasopharyngeal Swab     Status: None   Collection Time: 04/10/20 12:58 PM   Specimen: Nasopharyngeal Swab; Nasopharyngeal(NP) swabs in vial transport medium  Result Value Ref Range Status   SARS Coronavirus 2 by RT PCR  NEGATIVE NEGATIVE Final    Comment: (NOTE) SARS-CoV-2 target nucleic acids are NOT DETECTED.  The SARS-CoV-2 RNA is generally detectable in upper respiratory specimens during the acute phase of infection. The lowest concentration of SARS-CoV-2 viral copies this assay can detect is 138 copies/mL. A negative result does not preclude SARS-Cov-2 infection and should not be used as the sole basis for treatment or other patient management decisions. A negative result may occur with  improper specimen collection/handling, submission of specimen other than nasopharyngeal swab, presence of viral mutation(s) within the areas targeted by this assay, and inadequate number of viral copies(<138 copies/mL). A negative result must be combined with clinical observations, patient history, and epidemiological information. The expected result is Negative.  Fact Sheet for Patients:  EntrepreneurPulse.com.au  Fact Sheet for Healthcare Providers:  IncredibleEmployment.be  This test is no t yet approved or cleared by the [place] FDA and  has been authorized for detection and/or diagnosis of SARS-CoV-2 by FDA under an Emergency Use Authorization (EUA). This EUA will remain  in effect (meaning this test can be used) for the duration of the COVID-19 declaration under Section 564(b)(1) of the Act, 21 U.S.C.section 360bbb-3(b)(1), unless the authorization is terminated  or revoked sooner.       Influenza A by PCR NEGATIVE NEGATIVE Final   Influenza B by PCR NEGATIVE NEGATIVE Final    Comment: (NOTE) The Xpert Xpress SARS-CoV-2/FLU/RSV plus assay is intended as an aid in the diagnosis of influenza from Nasopharyngeal swab  specimens and should not be used as a sole basis for treatment. Nasal washings and aspirates are unacceptable for Xpert Xpress SARS-CoV-2/FLU/RSV testing.  Fact Sheet for Patients: EntrepreneurPulse.com.au  Fact Sheet for Healthcare Providers: IncredibleEmployment.be  This test is not yet approved or cleared by the [place] FDA and has been authorized for detection and/or diagnosis of SARS-CoV-2 by FDA under an Emergency Use Authorization (EUA). This EUA will remain in effect (meaning this test can be used) for the duration of the COVID-19 declaration under Section 564(b)(1) of the Act, 21 U.S.C. section 360bbb-3(b)(1), unless the authorization is terminated or revoked.  Performed at [facility], [address] [place], [place] [address]   CULTURE, BLOOD (ROUTINE X 2) w Reflex to ID Panel     Status: None   Collection Time: 04/11/20  6:42 PM   Specimen: BLOOD  Result Value Ref Range Status   Specimen Description BLOOD BLOOD LEFT HAND  Final   Special Requests   Final    BOTTLES DRAWN AEROBIC AND ANAEROBIC Blood Culture results may not be optimal due to an inadequate volume of blood received in culture bottles   Culture   Final    NO GROWTH 5 DAYS Performed at [facility], [address] [place], [place] [address]    Report Status 04/16/2020 FINAL  Final  CULTURE, BLOOD (ROUTINE X 2) w Reflex to ID Panel     Status: None   Collection Time: 04/11/20  6:50 PM   Specimen: BLOOD  Result Value Ref Range Status   Specimen Description BLOOD BLOOD LEFT FOREARM  Final   Special Requests   Final    BOTTLES DRAWN AEROBIC AND ANAEROBIC Blood Culture adequate volume   Culture   Final    NO GROWTH 5 DAYS Performed at [facility], [address] [place], [place] [address]    Report Status 04/16/2020 FINAL  Final     Radiology Studies: No results found. Scheduled Meds: . apixaban  2.5 mg Oral BID  . aspirin  EC  81 mg Oral Daily  . atorvastatin  80 mg Oral QHS  . brimonidine  1 drop Both Eyes TID  . carvedilol  37.5 mg Oral BID WC  . Chlorhexidine Gluconate Cloth  6 each Topical Q0600  . doxycycline  100 mg Oral Q12H  . fluticasone  2 spray Each Nare Daily  . hydrALAZINE  75 mg Oral Q8H  . insulin aspart  0-6 Units Subcutaneous TID WC  . ipratropium  2 spray Each Nare BID  . isosorbide mononitrate  60 mg Oral Daily  . levETIRAcetam  250 mg Oral Q M,W,F-HD  . levETIRAcetam  500 mg Oral Q12H  . losartan  25 mg Oral Daily  . mirtazapine  7.5 mg Oral QHS  . omega-3 acid ethyl esters  2 g Oral Daily  . pantoprazole  40 mg Oral Daily  . rOPINIRole  0.25 mg Oral QHS  . senna-docusate  1 tablet Oral BID  . sevelamer carbonate  1,600 mg Oral TID WC  . sodium chloride flush  3 mL Intravenous Q12H  . sodium chloride flush  3 mL Intravenous Q12H  . [START ON 04/20/2020] thiamine injection  100 mg Intravenous Daily  . timolol  1 drop Both Eyes TID  . triamcinolone   Topical BID  . valACYclovir  500 mg Oral Daily  . vitamin B-12  1,000 mcg Oral Daily   Continuous Infusions: . sodium chloride    . cefTRIAXone (ROCEPHIN)  IV Stopped (04/15/20 1748)  . levETIRAcetam    . thiamine injection Stopped (04/15/20 1352)     LOS: 6 days    Time spent: 25 mins.    Shawna Clamp, MD [facility]   If 7PM-7AM, please contact night-coverage

## 2020-04-16 NOTE — Progress Notes (Signed)
[facility]  ROUNDING NOTE   Subjective:   [name] was admitted to [facility] on 04/10/2020 for Slurred speech [R47.81] Altered mental status, unspecified altered mental status type [R41.82] AMS (altered mental status) [R41.82]  Denies any acute complaints or shortness of breath States she was able to eat her breakfast without any problem   Objective:  Vital signs in last 24 hours:  Temp:  [97.6 F (36.4 C)-98.7 F (37.1 C)] 98.7 F (37.1 C) (12/26 0900) Pulse Rate:  [60-98] 63 (12/26 0900) Resp:  [16-20] 16 (12/26 0900) BP: (136-159)/(55-76) 142/55 (12/26 0900) SpO2:  [90 %-97 %] 95 % (12/26 0900)  Weight change:  Filed Weights   04/10/20 1106  Weight: 50 kg    Intake/Output: I/O last 3 completed shifts: In: 251.7 [I.V.:3; IV Piggyback:248.7] Out: 500 [Other:500]   Intake/Output this shift:  No intake/output data recorded.  Physical Exam: General:  Resting in bed, in no acute distress  Head:  Oral mucous membranes moist  Eyes:  Sclerae and conjunctivae clear  Lungs:  Respirations symmetrical and unlabored, lungs clear  Heart:  S1-S2, no rubs or gallops  Abdomen:  Soft, nontender, nondistended  Extremities:  bilateral BKA, no edema noted in upper legs  Neurologic:  Able to follow commands and respond to name     Access: LIJ permcath    Basic Metabolic Panel: Recent Labs  Lab 04/10/20 1129 04/11/20 0403 04/12/20 0553 04/13/20 0405 04/14/20 0454 04/15/20 0414  NA  --  139 138 136 140 140  K  --  4.6 3.7 4.1 4.4 4.0  CL  --  99 99 101 100 102  CO2  --  24 26 25 25 27   GLUCOSE  --  121* 79 73 61* 78  BUN  --  57* 25* 37* 41* 17  CREATININE  --  10.31* 6.32* 8.01* 9.10* 5.38*  CALCIUM  --  8.7* 8.6* 8.1* 8.5* 8.5*  MG 2.2  --  1.9 2.0 2.0  --   PHOS  --  4.2 3.3 4.1 4.2  --     Liver Function Tests: Recent Labs  Lab 04/10/20 1127 04/12/20 0553  AST 17 17  ALT 13 10  ALKPHOS 92 77  BILITOT 0.6 0.6  PROT 7.4 6.6  ALBUMIN 3.7  3.2*   No results for input(s): LIPASE, AMYLASE in the last 168 hours. Recent Labs  Lab 04/10/20 1129  AMMONIA 19    CBC: Recent Labs  Lab 04/10/20 1127 04/11/20 0403 04/12/20 0553 04/13/20 0405 04/14/20 0454 04/15/20 0414 04/16/20 0439  WBC 4.2 3.9* 4.8 4.4  --   --  4.5  NEUTROABS 3.0  --  3.5  --   --   --   --   HGB 13.4 12.1 11.3* 11.5* 11.6* 11.0* 11.6*  HCT 43.2 39.6 36.3 38.0 37.9 36.0 37.6  MCV 96.4 98.3 96.3 96.9  --   --  97.9  PLT 113* 93* 97* 95*  --   --  77*    Cardiac Enzymes: No results for input(s): CKTOTAL, CKMB, CKMBINDEX, TROPONINI in the last 168 hours.  BNP: Invalid input(s): POCBNP  CBG: Recent Labs  Lab 04/15/20 1140 04/15/20 1636 04/15/20 1935 04/16/20 0848 04/16/20 0928  GLUCAP 100* 116* 117* 70 36    Microbiology: Results for orders placed or performed during the hospital encounter of 04/10/20  Resp Panel by RT-PCR (Flu A&B, Covid) Nasopharyngeal Swab     Status: None   Collection Time: 04/10/20 12:58 PM  Specimen: Nasopharyngeal Swab; Nasopharyngeal(NP) swabs in vial transport medium  Result Value Ref Range Status   SARS Coronavirus 2 by RT PCR NEGATIVE NEGATIVE Final    Comment: (NOTE) SARS-CoV-2 target nucleic acids are NOT DETECTED.  The SARS-CoV-2 RNA is generally detectable in upper respiratory specimens during the acute phase of infection. The lowest concentration of SARS-CoV-2 viral copies this assay can detect is 138 copies/mL. A negative result does not preclude SARS-Cov-2 infection and should not be used as the sole basis for treatment or other patient management decisions. A negative result may occur with  improper specimen collection/handling, submission of specimen other than nasopharyngeal swab, presence of viral mutation(s) within the areas targeted by this assay, and inadequate number of viral copies(<138 copies/mL). A negative result must be combined with clinical observations, patient history, and  epidemiological information. The expected result is Negative.  Fact Sheet for Patients:  EntrepreneurPulse.com.au  Fact Sheet for Healthcare Providers:  IncredibleEmployment.be  This test is no t yet approved or cleared by the [place] FDA and  has been authorized for detection and/or diagnosis of SARS-CoV-2 by FDA under an Emergency Use Authorization (EUA). This EUA will remain  in effect (meaning this test can be used) for the duration of the COVID-19 declaration under Section 564(b)(1) of the Act, 21 U.S.C.section 360bbb-3(b)(1), unless the authorization is terminated  or revoked sooner.       Influenza A by PCR NEGATIVE NEGATIVE Final   Influenza B by PCR NEGATIVE NEGATIVE Final    Comment: (NOTE) The Xpert Xpress SARS-CoV-2/FLU/RSV plus assay is intended as an aid in the diagnosis of influenza from Nasopharyngeal swab specimens and should not be used as a sole basis for treatment. Nasal washings and aspirates are unacceptable for Xpert Xpress SARS-CoV-2/FLU/RSV testing.  Fact Sheet for Patients: EntrepreneurPulse.com.au  Fact Sheet for Healthcare Providers: IncredibleEmployment.be  This test is not yet approved or cleared by the [place] FDA and has been authorized for detection and/or diagnosis of SARS-CoV-2 by FDA under an Emergency Use Authorization (EUA). This EUA will remain in effect (meaning this test can be used) for the duration of the COVID-19 declaration under Section 564(b)(1) of the Act, 21 U.S.C. section 360bbb-3(b)(1), unless the authorization is terminated or revoked.  Performed at [facility], [address] [place], [place] [address]   CULTURE, BLOOD (ROUTINE X 2) w Reflex to ID Panel     Status: None   Collection Time: 04/11/20  6:42 PM   Specimen: BLOOD  Result Value Ref Range Status   Specimen Description BLOOD BLOOD LEFT HAND  Final   Special  Requests   Final    BOTTLES DRAWN AEROBIC AND ANAEROBIC Blood Culture results may not be optimal due to an inadequate volume of blood received in culture bottles   Culture   Final    NO GROWTH 5 DAYS Performed at [facility], [address] [place], [place] [address]    Report Status 04/16/2020 FINAL  Final  CULTURE, BLOOD (ROUTINE X 2) w Reflex to ID Panel     Status: None   Collection Time: 04/11/20  6:50 PM   Specimen: BLOOD  Result Value Ref Range Status   Specimen Description BLOOD BLOOD LEFT FOREARM  Final   Special Requests   Final    BOTTLES DRAWN AEROBIC AND ANAEROBIC Blood Culture adequate volume   Culture   Final    NO GROWTH 5 DAYS Performed at [facility], [place], [place]  [address]    Report Status 04/16/2020 FINAL  Final    Coagulation Studies: No results for input(s): LABPROT, INR in the last 72 hours.  Urinalysis: No results for input(s): COLORURINE, LABSPEC, PHURINE, GLUCOSEU, HGBUR, BILIRUBINUR, KETONESUR, PROTEINUR, UROBILINOGEN, NITRITE, LEUKOCYTESUR in the last 72 hours.  Invalid input(s): APPERANCEUR    Imaging: No results found.   Medications:    Scheduled Meds: . apixaban  2.5 mg Oral BID  . aspirin EC  81 mg Oral Daily  . atorvastatin  80 mg Oral QHS  . brimonidine  1 drop Both Eyes TID  . carvedilol  37.5 mg Oral BID WC  . Chlorhexidine Gluconate Cloth  6 each Topical Q0600  . doxycycline  100 mg Oral Q12H  . fluticasone  2 spray Each Nare Daily  . hydrALAZINE  75 mg Oral Q8H  . insulin aspart  0-6 Units Subcutaneous TID WC  . ipratropium  2 spray Each Nare BID  . isosorbide mononitrate  60 mg Oral Daily  . levETIRAcetam  250 mg Oral Q M,W,F-HD  . levETIRAcetam  500 mg Oral Q12H  . losartan  25 mg Oral Daily  . mirtazapine  7.5 mg Oral QHS  . omega-3 acid ethyl esters  2 g Oral Daily  . pantoprazole  40 mg Oral Daily  . rOPINIRole  0.25 mg Oral QHS  . senna-docusate  1 tablet Oral BID  .  sevelamer carbonate  1,600 mg Oral TID WC  . sodium chloride flush  3 mL Intravenous Q12H  . sodium chloride flush  3 mL Intravenous Q12H  . [START ON 04/20/2020] thiamine injection  100 mg Intravenous Daily  . timolol  1 drop Both Eyes TID  . triamcinolone   Topical BID  . valACYclovir  500 mg Oral Daily  . vitamin B-12  1,000 mcg Oral Daily   Continuous Infusions: . sodium chloride    . cefTRIAXone (ROCEPHIN)  IV Stopped (04/15/20 1748)  . levETIRAcetam    . thiamine injection Stopped (04/15/20 1352)   PRN Meds:.sodium chloride, hydrALAZINE, meclizine, ondansetron **OR** ondansetron (ZOFRAN) IV, polyvinyl alcohol, sodium chloride flush    Assessment/ Plan:  [name] is a 69 y.o. black female with end stage renal disease on hemodialysis, peripheral vascular disease status post bilateral BKA, diabetes mellitus type II, coronary artery disease status post CABG admitted to [facility] on 04/10/2020 for Slurred speech [R47.81] Altered mental status, unspecified altered mental status type [R41.82] AMS (altered mental status) [R41.82]  [facility] Nephrology MWF [facility] [address] LIJ permcath [phone]  #  End Stage Renal Disease We will continue MWF schedule Plan for next dialysis on Monday Dialysis in chair if patient can tolerate it..     # Anemia of chronic kidney disease Lab Results  Component Value Date   HGB 11.6 (L) 04/16/2020   No indication for Epogen  #Secondary Hyperparathyroidism:  Lab Results  Component Value Date   PTH 385 (H) 09/03/2019   CALCIUM 8.5 (L) 04/15/2020   PHOS 4.2 04/14/2020  Will continue monitoring bone mineral metabolism parameters  #Shingles   Patient is on valacyclovir  lesions on the left chest sidewall.  #Altered mental status EEG-no seizures seen Likely acute metabolic encephalopathy-improving Had an episode of altered mental status during dialysis on Thursday, ?  Due to low blood pressure Patient became responsive as soon as blood was  returned Tolerated dialysis well on Friday.  No acute events Getting treatment with Rocephin and doxycycline for presumed pneumonia.  LOS: 6 Stephanie Ellis 12/26/202112:44 PM

## 2020-04-17 ENCOUNTER — Inpatient Hospital Stay: Payer: Medicare Other

## 2020-04-17 DIAGNOSIS — R410 Disorientation, unspecified: Secondary | ICD-10-CM | POA: Diagnosis not present

## 2020-04-17 LAB — MAGNESIUM: Magnesium: 2.1 mg/dL (ref 1.7–2.4)

## 2020-04-17 LAB — BASIC METABOLIC PANEL
Anion gap: 10 (ref 5–15)
BUN: 33 mg/dL — ABNORMAL HIGH (ref 8–23)
CO2: 26 mmol/L (ref 22–32)
Calcium: 8.2 mg/dL — ABNORMAL LOW (ref 8.9–10.3)
Chloride: 98 mmol/L (ref 98–111)
Creatinine, Ser: 8.49 mg/dL — ABNORMAL HIGH (ref 0.44–1.00)
GFR, Estimated: 5 mL/min — ABNORMAL LOW (ref >60)
Glucose, Bld: 122 mg/dL — ABNORMAL HIGH (ref 70–99)
Potassium: 4.7 mmol/L (ref 3.5–5.1)
Sodium: 134 mmol/L — ABNORMAL LOW (ref 135–145)

## 2020-04-17 LAB — GLUCOSE, CAPILLARY
Glucose-Capillary: 119 mg/dL — ABNORMAL HIGH (ref 70–99)
Glucose-Capillary: 126 mg/dL — ABNORMAL HIGH (ref 70–99)
Glucose-Capillary: 59 mg/dL — ABNORMAL LOW (ref 70–99)
Glucose-Capillary: 105 mg/dL — ABNORMAL HIGH (ref 70–99)

## 2020-04-17 LAB — HEMOGLOBIN AND HEMATOCRIT, BLOOD
HCT: 37.1 % (ref 36.0–46.0)
Hemoglobin: 11.4 g/dL — ABNORMAL LOW (ref 12.0–15.0)

## 2020-04-17 LAB — VITAMIN B1: Vitamin B1 (Thiamine): 134.8 nmol/L (ref 66.5–200.0)

## 2020-04-17 LAB — PHOSPHORUS: Phosphorus: 3.8 mg/dL (ref 2.5–4.6)

## 2020-04-17 LAB — HEPATITIS B SURFACE ANTIBODY, QUANTITATIVE: Hep B S AB Quant (Post): 12.9 m[IU]/mL (ref >9.9)

## 2020-04-17 NOTE — Care Management Important Message (Signed)
Important Message  Patient Details  Name: Stephanie Ellis MRN: [id number] Date of Birth: 1950-07-10   Medicare Important Message Given:  Yes     Dannette Barbara 04/17/2020, 10:57 AM

## 2020-04-17 NOTE — Progress Notes (Signed)
[facility]  ROUNDING NOTE   Subjective:   Stephanie Ellis was admitted to [facility] on 04/10/2020 for Slurred speech [R47.81] Altered mental status, unspecified altered mental status type [R41.82] AMS (altered mental status) [R41.82]  Patient resting comfortably in bed at the moment. Due for hemodialysis treatment today.   Objective:  Vital signs in last 24 hours:  Temp:  [97.7 F (36.5 C)-98.6 F (37 C)] 97.7 F (36.5 C) (12/27 1224) Pulse Rate:  [58-67] 58 (12/27 1224) Resp:  [16-20] 20 (12/27 1224) BP: (142-181)/(55-86) 175/86 (12/27 1224) SpO2:  [94 %-100 %] 99 % (12/27 1224)  Weight change:  Filed Weights   04/10/20 1106  Weight: 50 kg    Intake/Output: I/O last 3 completed shifts: In: 98.7 [IV Piggyback:98.7] Out: -    Intake/Output this shift:  No intake/output data recorded.  Physical Exam: General:  Resting in bed, in no acute distress  Head:  Oral mucous membranes moist  Eyes:  Sclerae and conjunctivae clear  Lungs:   Respirations symmetrical and unlabored, lungs clear  Heart:  S1-S2, no rubs or gallops  Abdomen:   Soft, nontender, nondistended  Extremities:  bilateral lower extremity amputations.  Neurologic:  Awake, alert     Access: LIJ permcath    Basic Metabolic Panel: Recent Labs  Lab 04/11/20 0403 04/12/20 0553 04/13/20 0405 04/14/20 0454 04/15/20 0414 04/17/20 0354  NA 139 138 136 140 140 134*  K 4.6 3.7 4.1 4.4 4.0 4.7  CL 99 99 101 100 102 98  CO2 24 26 25 25 27 26   GLUCOSE 121* 79 73 61* 78 122*  BUN 57* 25* 37* 41* 17 33*  CREATININE 10.31* 6.32* 8.01* 9.10* 5.38* 8.49*  CALCIUM 8.7* 8.6* 8.1* 8.5* 8.5* 8.2*  MG  --  1.9 2.0 2.0  --  2.1  PHOS 4.2 3.3 4.1 4.2  --  3.8    Liver Function Tests: Recent Labs  Lab 04/12/20 0553  AST 17  ALT 10  ALKPHOS 77  BILITOT 0.6  PROT 6.6  ALBUMIN 3.2*   No results for input(s): LIPASE, AMYLASE in the last 168 hours. No results for input(s): AMMONIA in the last 168  hours.  CBC: Recent Labs  Lab 04/11/20 0403 04/12/20 0553 04/13/20 0405 04/14/20 0454 04/15/20 0414 04/16/20 0439 04/17/20 0354  WBC 3.9* 4.8 4.4  --   --  4.5  --   NEUTROABS  --  3.5  --   --   --   --   --   HGB 12.1 11.3* 11.5* 11.6* 11.0* 11.6* 11.4*  HCT 39.6 36.3 38.0 37.9 36.0 37.6 37.1  MCV 98.3 96.3 96.9  --   --  97.9  --   PLT 93* 97* 95*  --   --  77*  --     Cardiac Enzymes: No results for input(s): CKTOTAL, CKMB, CKMBINDEX, TROPONINI in the last 168 hours.  BNP: Invalid input(s): POCBNP  CBG: Recent Labs  Lab 04/16/20 1258 04/16/20 1739 04/16/20 2053 04/17/20 0725 04/17/20 1230  GLUCAP 124* 125* 107* 119* 126*    Microbiology: Results for orders placed or performed during the hospital encounter of 04/10/20  Resp Panel by RT-PCR (Flu A&B, Covid) Nasopharyngeal Swab     Status: None   Collection Time: 04/10/20 12:58 PM   Specimen: Nasopharyngeal Swab; Nasopharyngeal(NP) swabs in vial transport medium  Result Value Ref Range Status   SARS Coronavirus 2 by RT PCR NEGATIVE NEGATIVE Final    Comment: (NOTE) SARS-CoV-2  target nucleic acids are NOT DETECTED.  The SARS-CoV-2 RNA is generally detectable in upper respiratory specimens during the acute phase of infection. The lowest concentration of SARS-CoV-2 viral copies this assay can detect is 138 copies/mL. A negative result does not preclude SARS-Cov-2 infection and should not be used as the sole basis for treatment or other patient management decisions. A negative result may occur with  improper specimen collection/handling, submission of specimen other than nasopharyngeal swab, presence of viral mutation(s) within the areas targeted by this assay, and inadequate number of viral copies(<138 copies/mL). A negative result must be combined with clinical observations, patient history, and epidemiological information. The expected result is Negative.  Fact Sheet for Patients:   EntrepreneurPulse.com.au  Fact Sheet for Healthcare Providers:  IncredibleEmployment.be  This test is no t yet approved or cleared by the [place] FDA and  has been authorized for detection and/or diagnosis of SARS-CoV-2 by FDA under an Emergency Use Authorization (EUA). This EUA will remain  in effect (meaning this test can be used) for the duration of the COVID-19 declaration under Section 564(b)(1) of the Act, 21 U.S.C.section 360bbb-3(b)(1), unless the authorization is terminated  or revoked sooner.       Influenza A by PCR NEGATIVE NEGATIVE Final   Influenza B by PCR NEGATIVE NEGATIVE Final    Comment: (NOTE) The Xpert Xpress SARS-CoV-2/FLU/RSV plus assay is intended as an aid in the diagnosis of influenza from Nasopharyngeal swab specimens and should not be used as a sole basis for treatment. Nasal washings and aspirates are unacceptable for Xpert Xpress SARS-CoV-2/FLU/RSV testing.  Fact Sheet for Patients: EntrepreneurPulse.com.au  Fact Sheet for Healthcare Providers: IncredibleEmployment.be  This test is not yet approved or cleared by the [place] FDA and has been authorized for detection and/or diagnosis of SARS-CoV-2 by FDA under an Emergency Use Authorization (EUA). This EUA will remain in effect (meaning this test can be used) for the duration of the COVID-19 declaration under Section 564(b)(1) of the Act, 21 U.S.C. section 360bbb-3(b)(1), unless the authorization is terminated or revoked.  Performed at [facility] [address] [place], [place] [address]   CULTURE, BLOOD (ROUTINE X 2) w Reflex to ID Panel     Status: None   Collection Time: 04/11/20  6:42 PM   Specimen: BLOOD  Result Value Ref Range Status   Specimen Description BLOOD BLOOD LEFT HAND  Final   Special Requests   Final    BOTTLES DRAWN AEROBIC AND ANAEROBIC Blood Culture results may not be optimal  due to an inadequate volume of blood received in culture bottles   Culture   Final    NO GROWTH 5 DAYS Performed at [facility], [address] [place], [place] [address]    Report Status 04/16/2020 FINAL  Final  CULTURE, BLOOD (ROUTINE X 2) w Reflex to ID Panel     Status: None   Collection Time: 04/11/20  6:50 PM   Specimen: BLOOD  Result Value Ref Range Status   Specimen Description BLOOD BLOOD LEFT FOREARM  Final   Special Requests   Final    BOTTLES DRAWN AEROBIC AND ANAEROBIC Blood Culture adequate volume   Culture   Final    NO GROWTH 5 DAYS Performed at [facility], [address], [place], [place] [address]    Report Status 04/16/2020 FINAL  Final    Coagulation Studies: No results for input(s): LABPROT, INR in the last 72 hours.  Urinalysis: No results for input(s):  COLORURINE, LABSPEC, [place], GLUCOSEU, HGBUR, BILIRUBINUR, KETONESUR, PROTEINUR, UROBILINOGEN, NITRITE, LEUKOCYTESUR in the last 72 hours.  Invalid input(s): APPERANCEUR    Imaging: DG Chest 2 View  Result Date: 04/17/2020 CLINICAL DATA:  Pneumonia EXAM: CHEST - 2 VIEW COMPARISON:  04/10/2020 FINDINGS: CABG changes with cardiac enlargement. Mild pulmonary vascular congestion has progressed. Small left pleural effusion has progressed. Small right effusion unchanged. Left lower lobe atelectasis/infiltrate with mild progression. Improved aeration right lung base. Left jugular central venous catheter tip in the right atrium unchanged. No pneumothorax IMPRESSION: Cardiac enlargement. Progressive mild vascular congestion and small left effusion suggesting fluid overload. Left lower lobe atelectasis/infiltrate with progression. Improved aeration right lung base. Electronically Signed   By: Franchot Gallo M.D.   On: 04/17/2020 08:26     Medications:    Scheduled Meds: . apixaban  2.5 mg Oral BID  . aspirin EC  81 mg Oral Daily  . atorvastatin  80 mg Oral QHS  . brimonidine  1 drop Both  Eyes TID  . carvedilol  37.5 mg Oral BID WC  . Chlorhexidine Gluconate Cloth  6 each Topical Q0600  . doxycycline  100 mg Oral Q12H  . fluticasone  2 spray Each Nare Daily  . hydrALAZINE  75 mg Oral Q8H  . insulin aspart  0-6 Units Subcutaneous TID WC  . ipratropium  2 spray Each Nare BID  . isosorbide mononitrate  60 mg Oral Daily  . levETIRAcetam  250 mg Oral Q M,W,F-HD  . levETIRAcetam  500 mg Oral Q12H  . losartan  25 mg Oral Daily  . mirtazapine  7.5 mg Oral QHS  . omega-3 acid ethyl esters  2 g Oral Daily  . pantoprazole  40 mg Oral Daily  . rOPINIRole  0.25 mg Oral QHS  . senna-docusate  1 tablet Oral BID  . sevelamer carbonate  1,600 mg Oral TID WC  . sodium chloride flush  3 mL Intravenous Q12H  . sodium chloride flush  3 mL Intravenous Q12H  . [START ON 04/20/2020] thiamine injection  100 mg Intravenous Daily  . timolol  1 drop Both Eyes TID  . triamcinolone   Topical BID  . valACYclovir  500 mg Oral Daily  . vitamin B-12  1,000 mcg Oral Daily   Continuous Infusions: . sodium chloride    . cefTRIAXone (ROCEPHIN)  IV Stopped (04/17/20 0116)  . levETIRAcetam    . thiamine injection 250 mg (04/17/20 1314)   PRN Meds:.sodium chloride, hydrALAZINE, meclizine, ondansetron **OR** ondansetron (ZOFRAN) IV, polyvinyl alcohol, sodium chloride flush    Assessment/ Plan:  Ms. Marleen Moret is a 69 y.o. black female with end stage renal disease on hemodialysis, peripheral vascular disease status post bilateral BKA, diabetes mellitus type II, coronary artery disease status post CABG admitted to [facility] on 04/10/2020 for Slurred speech [R47.81] Altered mental status, unspecified altered mental status type [R41.82] AMS (altered mental status) [R41.82]  [facility] Nephrology MWF [facility] [address] LIJ permcath 52kg  #  End Stage Renal Disease Patient due for hemodialysis treatment today.  Orders have been prepared.   # Anemia of chronic kidney disease Lab Results  Component Value Date    HGB 11.4 (L) 04/17/2020   No immediate need for Epogen.  Continue peritoneal monitor CBC.  #Secondary Hyperparathyroidism:  Lab Results  Component Value Date   PTH 385 (H) 09/03/2019   CALCIUM 8.2 (L) 04/17/2020   PHOS 3.8 04/17/2020  Will continue monitoring bone mineral metabolism parameters  #Shingles   Patient  is on valacyclovir lesions on the left chest sidewall.  #Altered mental status EEG-no seizures seen Likely acute metabolic encephalopathy-improving Had an episode of altered mental status during dialysis on Thursday, ?  Due to low blood pressure Patient became responsive as soon as blood was returned Tolerated dialysis well on Friday.  No acute events Getting treatment with Rocephin and doxycycline for presumed pneumonia.     LOS: 7 Brandyn Thien 12/27/20211:42 PM

## 2020-04-17 NOTE — Plan of Care (Signed)
  Problem: Education: Goal: Knowledge of General Education information will improve Description: Including pain rating scale, medication(s)/side effects and non-pharmacologic comfort measures Outcome: Progressing   Problem: Clinical Measurements: Goal: Ability to maintain clinical measurements within normal limits will improve Outcome: Progressing Goal: Will remain free from infection Outcome: Progressing Goal: Diagnostic test results will improve Outcome: Progressing Goal: Respiratory complications will improve Outcome: Progressing Goal: Cardiovascular complication will be avoided Outcome: Progressing   Problem: Coping: Goal: Level of anxiety will decrease Outcome: Progressing   Problem: Elimination: Goal: Will not experience complications related to bowel motility Outcome: Progressing   Problem: Pain Managment: Goal: General experience of comfort will improve Outcome: Progressing   Problem: Safety: Goal: Ability to remain free from injury will improve Outcome: Progressing   Problem: Skin Integrity: Goal: Risk for impaired skin integrity will decrease Outcome: Progressing

## 2020-04-17 NOTE — Progress Notes (Addendum)
PROGRESS NOTE    Stephanie Ellis  ZOX:[id number] DOB: October 15, 1950 DOA: 04/10/2020 PCP: Pcp, No   Brief Narrative:  This 69 y.o.femalewith PMH significant fordiabetes mellitus , ESRD on HD (M/W/F) , hypertension, PAD s/p bilateral BKA,coronary artery disease status post CABG who was brought to the emergency room by EMS for evaluation of mental status changes. Patient was recently discharged from the hospital on 04/06/20.During that hospitalization she had a brief PEA arrest aftershe received hydromorphone and recovered immediately following administration of Narcan. Her daughter states that since her discharge she has not been herusual self and has been acting very erratic. At baseline patient is able to take care of herself and activities of daily living such as transfers, grooming and feeding herself. Since her discharge she has been very weak and has been unable to feed herself or transfer to her wheelchair. Family is also concerned that her speech is slurred.Patient refused to go to dialysis on the morning of her admission and so EMS was called.  Patient is admitted with acute encephalopathy without clear cause.  MRI brain no acute infarction, hemorrhage.  EEG: no evidence of seizures.  Ammonia level normal.  Neurology is consulted.  MRI does not show any evidence of stroke or anoxic brain injury.  There is no indication for LP at this time. Recommended continued thiamine treatment.  Nephrology consulted for continuation of hemodialysis.  Pulmonology consulted for mild loculated effusion on CT chest.  There is no signs of empyema and effusion is very small for thoracocentesis.  Continue empirical treatment for CAP and encourage incentive spirometry.  Her mental status has been improving.  Assessment & Plan:   Principal Problem:   AMS (altered mental status) Active Problems:   ESRD (end stage renal disease) on dialysis ([place])   Type II diabetes mellitus with complication (HCC)    Hypertension   Seizure disorder (HCC)   Cirrhosis, cardiac   Acute Metabolic Encephalopathy >> Fluctuating Unclear etiology, at time of admission - ddx includes anoxic brain injury related to PEA arrest at last hospitalization, encephalitis in setting of shingles, hypertensive encephalopathy. MRI brian without acute findings (chronic microvascular ischemic changes and few small chronic infarcts) EEG: no evidence of seizures. Neurology consulted.  Recommended to continue thiamine.  Keppra level 22.59  Ammonia level normal.  Vitamin B12 2900. Blood cultures no growth so far. Mental status is improving.  She is alert and oriented x 2. ABG shows normal PCO2.  MRI ruled out anoxic brain injury. Patient could not complete hemodialysis 12/23.  She became less responsive during dialysis so it was held in between. Patient completed  hemodialysis on 12/24.  She had hemodialysis while sitting in the chair. She will continue HD on MWF schedule. Her mental status appears at her baseline.  Mild Loculated Effusion : Assume this could be pneumonia.   Continue ceftriaxone and doxycycline for CAP( Day 7/7) Blood cultures; No growth so far. Pulmonology consulted, recommended to continue treatment for CAP.   There is no empyema and effusion is very small for thoracocentesis. Encourage incentive spirometry.  Physical deconditioning Most likely secondary to recent hospital stay. PT recommended SNF. Continue fall precautions.  Hypertensive urgency : Improving Home blood pressure medication resumed.  Carvedilol, hydralazine and Imdur. Blood pressure still remained elevated with increase in Imdur to 60 mg daily Hydralazine increased to 75 mg every 8 hrs. Added losartan 25 mg daily. IV hydralazine prn for SBP > 180 Finally blood pressure is improving and in a reasonable range.  T2DM  with complications of end-stage renal disease on hemodialysis Maintain carb consistent diet. Glycemic control with  sliding scale insulin. Nephrology consulted for renal replacement therapy. Patient could not complete hemodialysis 12/23.  She became less responsive during dialysis so it was held in between. Patient completed hemodialysis on 12/24 then will be continued on MWF schedule. She is scheduled to get hemodialysis today  Chronic combined systolic and diastolic dysfunction CHF Patient's last known LVEF is about 35 to 40% from an echocardiogram done in 2019 Stable and not acutely exacerbated. Volume management per renal.  Seizure disorder Continue Keppra. Continue seizure precautions. EEG: No evidence of seizures.  Thrombocytopenia Chronic, continue to monitor.  Peripheral arterial disease Status post bilateral BKA Continue statin, apixaban and carvedilol  Depression Continue Remeron.  Constipation: Continue Senokot and Colace   DVT prophylaxis: Eliquis Code Status: Full code Family Communication: No Family in the room Disposition Plan:   Status is: Inpatient  Remains inpatient appropriate because:Inpatient level of care appropriate due to severity of illness   Dispo: The patient is from: Home              Anticipated d/c is to:  SNF              Anticipated d/c date is:  >> 1 days.              Patient currently is not medically stable to d/c.  Consultants:   Pulmonology  Nephrology  Neurology  Procedures: EEG  Antimicrobials: Anti-infectives (From admission, onward)   Start     Dose/Rate Route Frequency Ordered Stop   04/11/20 1800  valACYclovir (VALTREX) tablet 500 mg        500 mg Oral Daily 04/10/20 2043     04/11/20 1330  doxycycline (VIBRA-TABS) tablet 100 mg        100 mg Oral Every 12 hours 04/11/20 1244 04/18/20 0959   04/11/20 1300  cefTRIAXone (ROCEPHIN) 2 g in sodium chloride 0.9 % 100 mL IVPB        2 g 200 mL/hr over 30 Minutes Intravenous Every 24 hours 04/11/20 1239 04/18/20 1259   04/11/20 1300  azithromycin (ZITHROMAX) 500 mg in sodium  chloride 0.9 % 250 mL IVPB  Status:  Discontinued        500 mg 250 mL/hr over 60 Minutes Intravenous Every 24 hours 04/11/20 1239 04/11/20 1244   04/10/20 1500  valACYclovir (VALTREX) tablet 500 mg  Status:  Discontinued        500 mg Oral Daily 04/10/20 1446 04/10/20 2042      Subjective: Patient was seen and examined at bedside.  Overnight events noted.   She is alert and oriented x2.  It seems like she is at her baseline mental status. Her left arm swelling has resolved.  Her blood pressure is improving and is in a reasonable range.  Objective: Vitals:   04/17/20 0013 04/17/20 0405 04/17/20 0726 04/17/20 1224  BP: (!) 180/71 (!) 174/69 (!) 181/79 (!) 175/86  Pulse: 64 67 64 (!) 58  Resp: 16 16 16 20   Temp: 98.1 F (36.7 C) 97.8 F (36.6 C) 97.8 F (36.6 C) 97.7 F (36.5 C)  TempSrc: Axillary Oral    SpO2: 100% 100% 100% 99%  Weight:      Height:        Intake/Output Summary (Last 24 hours) at 04/17/2020 1245 Last data filed at 04/16/2020 1854 Gross per 24 hour  Intake 0 ml  Output --  Net 0  ml   Filed Weights   04/10/20 1106  Weight: 50 kg    Examination:  General exam: Appears calm and comfortable, not in any distress. Respiratory system: Clear to auscultation. Respiratory effort normal. Cardiovascular system: S1 & S2 heard, RRR. No JVD, murmurs, rubs, gallops or clicks. No pedal edema. Gastrointestinal system: Abdomen is nondistended, soft and nontender. No organomegaly or masses felt. Normal bowel sounds heard. Central nervous system: Alert and oriented x 2. No focal neurological deficits. Extremities: Bilateral BKA.  Left arm swelling resoved. Skin: No rashes, lesions or ulcers. Psychiatry: Judgement and insight appear normal. Mood & affect appropriate.     Data Reviewed: I have personally reviewed following labs and imaging studies  CBC: Recent Labs  Lab 04/11/20 0403 04/12/20 0553 04/13/20 0405 04/14/20 0454 04/15/20 0414 04/16/20 0439  04/17/20 0354  WBC 3.9* 4.8 4.4  --   --  4.5  --   NEUTROABS  --  3.5  --   --   --   --   --   HGB 12.1 11.3* 11.5* 11.6* 11.0* 11.6* 11.4*  HCT 39.6 36.3 38.0 37.9 36.0 37.6 37.1  MCV 98.3 96.3 96.9  --   --  97.9  --   PLT 93* 97* 95*  --   --  77*  --    Basic Metabolic Panel: Recent Labs  Lab 04/11/20 0403 04/12/20 0553 04/13/20 0405 04/14/20 0454 04/15/20 0414 04/17/20 0354  NA 139 138 136 140 140 134*  K 4.6 3.7 4.1 4.4 4.0 4.7  CL 99 99 101 100 102 98  CO2 24 26 25 25 27 26   GLUCOSE 121* 79 73 61* 78 122*  BUN 57* 25* 37* 41* 17 33*  CREATININE 10.31* 6.32* 8.01* 9.10* 5.38* 8.49*  CALCIUM 8.7* 8.6* 8.1* 8.5* 8.5* 8.2*  MG  --  1.9 2.0 2.0  --  2.1  PHOS 4.2 3.3 4.1 4.2  --  3.8   GFR: Estimated Creatinine Clearance: 3.6 mL/min (A) (by C-G formula based on SCr of 8.49 mg/dL (H)). Liver Function Tests: Recent Labs  Lab 04/12/20 0553  AST 17  ALT 10  ALKPHOS 77  BILITOT 0.6  PROT 6.6  ALBUMIN 3.2*   No results for input(s): LIPASE, AMYLASE in the last 168 hours. No results for input(s): AMMONIA in the last 168 hours. Coagulation Profile: No results for input(s): INR, PROTIME in the last 168 hours. Cardiac Enzymes: No results for input(s): CKTOTAL, CKMB, CKMBINDEX, TROPONINI in the last 168 hours. BNP (last 3 results) No results for input(s): PROBNP in the last 8760 hours. HbA1C: No results for input(s): HGBA1C in the last 72 hours. CBG: Recent Labs  Lab 04/16/20 1258 04/16/20 1739 04/16/20 2053 04/17/20 0725 04/17/20 1230  GLUCAP 124* 125* 107* 119* 126*   Lipid Profile: No results for input(s): CHOL, HDL, LDLCALC, TRIG, CHOLHDL, LDLDIRECT in the last 72 hours. Thyroid Function Tests: No results for input(s): TSH, T4TOTAL, FREET4, T3FREE, THYROIDAB in the last 72 hours. Anemia Panel: No results for input(s): VITAMINB12, FOLATE, FERRITIN, TIBC, IRON, RETICCTPCT in the last 72 hours. Sepsis Labs: Recent Labs  Lab 04/12/20 0553  PROCALCITON  0.37    Recent Results (from the past 240 hour(s))  Resp Panel by RT-PCR (Flu A&B, Covid) Nasopharyngeal Swab     Status: None   Collection Time: 04/10/20 12:58 PM   Specimen: Nasopharyngeal Swab; Nasopharyngeal(NP) swabs in vial transport medium  Result Value Ref Range Status   SARS Coronavirus 2 by RT PCR  NEGATIVE NEGATIVE Final    Comment: (NOTE) SARS-CoV-2 target nucleic acids are NOT DETECTED.  The SARS-CoV-2 RNA is generally detectable in upper respiratory specimens during the acute phase of infection. The lowest concentration of SARS-CoV-2 viral copies this assay can detect is 138 copies/mL. A negative result does not preclude SARS-Cov-2 infection and should not be used as the sole basis for treatment or other patient management decisions. A negative result may occur with  improper specimen collection/handling, submission of specimen other than nasopharyngeal swab, presence of viral mutation(s) within the areas targeted by this assay, and inadequate number of viral copies(<138 copies/mL). A negative result must be combined with clinical observations, patient history, and epidemiological information. The expected result is Negative.  Fact Sheet for Patients:  EntrepreneurPulse.com.au  Fact Sheet for Healthcare Providers:  IncredibleEmployment.be  This test is no t yet approved or cleared by the [place] FDA and  has been authorized for detection and/or diagnosis of SARS-CoV-2 by FDA under an Emergency Use Authorization (EUA). This EUA will remain  in effect (meaning this test can be used) for the duration of the COVID-19 declaration under Section 564(b)(1) of the Act, 21 U.S.C.section 360bbb-3(b)(1), unless the authorization is terminated  or revoked sooner.       Influenza A by PCR NEGATIVE NEGATIVE Final   Influenza B by PCR NEGATIVE NEGATIVE Final    Comment: (NOTE) The Xpert Xpress SARS-CoV-2/FLU/RSV plus assay is intended  as an aid in the diagnosis of influenza from Nasopharyngeal swab specimens and should not be used as a sole basis for treatment. Nasal washings and aspirates are unacceptable for Xpert Xpress SARS-CoV-2/FLU/RSV testing.  Fact Sheet for Patients: EntrepreneurPulse.com.au  Fact Sheet for Healthcare Providers: IncredibleEmployment.be  This test is not yet approved or cleared by the [place] FDA and has been authorized for detection and/or diagnosis of SARS-CoV-2 by FDA under an Emergency Use Authorization (EUA). This EUA will remain in effect (meaning this test can be used) for the duration of the COVID-19 declaration under Section 564(b)(1) of the Act, 21 U.S.C. section 360bbb-3(b)(1), unless the authorization is terminated or revoked.  Performed at [facility], [address] [place], [place] [address]   CULTURE, BLOOD (ROUTINE X 2) w Reflex to ID Panel     Status: None   Collection Time: 04/11/20  6:42 PM   Specimen: BLOOD  Result Value Ref Range Status   Specimen Description BLOOD BLOOD LEFT HAND  Final   Special Requests   Final    BOTTLES DRAWN AEROBIC AND ANAEROBIC Blood Culture results may not be optimal due to an inadequate volume of blood received in culture bottles   Culture   Final    NO GROWTH 5 DAYS Performed at [facility], [address] [place], [place] [address]    Report Status 04/16/2020 FINAL  Final  CULTURE, BLOOD (ROUTINE X 2) w Reflex to ID Panel     Status: None   Collection Time: 04/11/20  6:50 PM   Specimen: BLOOD  Result Value Ref Range Status   Specimen Description BLOOD BLOOD LEFT FOREARM  Final   Special Requests   Final    BOTTLES DRAWN AEROBIC AND ANAEROBIC Blood Culture adequate volume   Culture   Final    NO GROWTH 5 DAYS Performed at [facility], [address], [place], [place] [address]    Report Status 04/16/2020 FINAL  Final     Radiology Studies: DG Chest  2 View  Result Date:  04/17/2020 CLINICAL DATA:  Pneumonia EXAM: CHEST - 2 VIEW COMPARISON:  04/10/2020 FINDINGS: CABG changes with cardiac enlargement. Mild pulmonary vascular congestion has progressed. Small left pleural effusion has progressed. Small right effusion unchanged. Left lower lobe atelectasis/infiltrate with mild progression. Improved aeration right lung base. Left jugular central venous catheter tip in the right atrium unchanged. No pneumothorax IMPRESSION: Cardiac enlargement. Progressive mild vascular congestion and small left effusion suggesting fluid overload. Left lower lobe atelectasis/infiltrate with progression. Improved aeration right lung base. Electronically Signed   By: Franchot Gallo M.D.   On: 04/17/2020 08:26   Scheduled Meds: . apixaban  2.5 mg Oral BID  . aspirin EC  81 mg Oral Daily  . atorvastatin  80 mg Oral QHS  . brimonidine  1 drop Both Eyes TID  . carvedilol  37.5 mg Oral BID WC  . Chlorhexidine Gluconate Cloth  6 each Topical Q0600  . doxycycline  100 mg Oral Q12H  . fluticasone  2 spray Each Nare Daily  . hydrALAZINE  75 mg Oral Q8H  . insulin aspart  0-6 Units Subcutaneous TID WC  . ipratropium  2 spray Each Nare BID  . isosorbide mononitrate  60 mg Oral Daily  . levETIRAcetam  250 mg Oral Q M,W,F-HD  . levETIRAcetam  500 mg Oral Q12H  . losartan  25 mg Oral Daily  . mirtazapine  7.5 mg Oral QHS  . omega-3 acid ethyl esters  2 g Oral Daily  . pantoprazole  40 mg Oral Daily  . rOPINIRole  0.25 mg Oral QHS  . senna-docusate  1 tablet Oral BID  . sevelamer carbonate  1,600 mg Oral TID WC  . sodium chloride flush  3 mL Intravenous Q12H  . sodium chloride flush  3 mL Intravenous Q12H  . [START ON 04/20/2020] thiamine injection  100 mg Intravenous Daily  . timolol  1 drop Both Eyes TID  . triamcinolone   Topical BID  . valACYclovir  500 mg Oral Daily  . vitamin B-12  1,000 mcg Oral Daily   Continuous Infusions: . sodium chloride    . cefTRIAXone  (ROCEPHIN)  IV Stopped (04/17/20 0116)  . levETIRAcetam    . thiamine injection Stopped (04/16/20 2217)     LOS: 7 days    Time spent: 25 mins.    Shawna Clamp, MD [facility]   If 7PM-7AM, please contact night-coverage

## 2020-04-17 NOTE — Progress Notes (Signed)
Occupational Therapy Treatment Patient Details Name: Stephanie Ellis MRN: [id number] DOB: 05/10/1950 Today's Date: 04/17/2020    History of present illness Stephanie Ellis is a 69yoF who [facility] c AMS. PMH: DM, ESRD on HD MWF, HTN, PAD s/p bilateral BKA, CAD, CAD s/p CABG, left eye blindnss, heavy right eye visual impairment.   OT comments  Stephanie Ellis was seen for OT treatment on this date. Upon arrival to room pt supine in bed sleeping soundly, easily awoken and responsive but somnolent t/o session. MOD A sup<>sit and intermittent MOD A to maintain static sitting balance. Supine: BP 164/61, MAP 102. Sitting: BP 147/67, MAP 97. Pt limited by arousal level this date. Pt making progress toward goals. Pt continues to benefit from skilled OT services to maximize return to PLOF and minimize risk of future falls, injury, caregiver burden, and readmission. Will continue to follow POC. Discharge recommendation remains appropriate.    Follow Up Recommendations  SNF    Equipment Recommendations  Other (comment) (TBD)    Recommendations for Other Services      Precautions / Restrictions Precautions Precautions: Fall Restrictions Weight Bearing Restrictions: No       Mobility Bed Mobility Overal bed mobility: Needs Assistance Bed Mobility: Supine to Sit;Sit to Supine     Supine to sit: Mod assist Sit to supine: Mod assist           Balance Overall balance assessment: Needs assistance Sitting-balance support: Feet supported;No upper extremity supported Sitting balance-Leahy Scale: Fair                                     ADL either performed or assessed with clinical judgement   ADL Overall ADL's : Needs assistance/impaired                                       General ADL Comments: MOD A to maintain static seated balance.               Cognition Arousal/Alertness: Lethargic Behavior During Therapy: WFL for tasks  assessed/performed Overall Cognitive Status: Difficult to assess                                          Exercises Exercises: Other exercises Other Exercises Other Exercises: Pt educated re: OT role, DME recs, d/c recs, falls prevention Other Exercises: sup<>sit, sitting balance/toelrance   Shoulder Instructions       General Comments Supine: BP 164/61, MAP 102. Sitting: BP 147/67, MAP 97    Pertinent Vitals/ Pain       Pain Assessment: No/denies pain         Frequency  Min 1X/week        Progress Toward Goals  OT Goals(current goals can now be found in the care plan section)  Progress towards OT goals: Progressing toward goals  Acute Rehab OT Goals Patient Stated Goal: to improve mobility OT Goal Formulation: With patient Time For Goal Achievement: 04/28/20 Potential to Achieve Goals: Good ADL Goals Pt Will Perform Grooming: with modified independence;sitting Pt Will Perform Lower Body Dressing: with modified independence;sitting/lateral leans Pt Will Transfer to Toilet: with min guard assist;anterior/posterior transfer;bedside commode  Plan Discharge plan remains appropriate;Frequency remains  appropriate       AM-PAC OT "6 Clicks" Daily Activity     Outcome Measure   Help from another person eating meals?: A Little Help from another person taking care of personal grooming?: A Lot Help from another person toileting, which includes using toliet, bedpan, or urinal?: A Lot Help from another person bathing (including washing, rinsing, drying)?: A Little Help from another person to put on and taking off regular upper body clothing?: A Little Help from another person to put on and taking off regular lower body clothing?: A Little 6 Click Score: 16    End of Session    OT Visit Diagnosis: Other abnormalities of gait and mobility (R26.89)   Activity Tolerance Patient tolerated treatment well   Patient Left in bed;with call bell/phone within  reach;with bed alarm set   Nurse Communication          Time: 1700-1749 OT Time Calculation (min): 10 min  Charges: OT Treatments $Therapeutic Activity: 8-22 mins  Dessie Coma, M.S. OTR/L  04/17/20, 12:54 PM  ascom [phone]

## 2020-04-18 DIAGNOSIS — R410 Disorientation, unspecified: Secondary | ICD-10-CM | POA: Diagnosis not present

## 2020-04-18 LAB — GLUCOSE, CAPILLARY
Glucose-Capillary: 103 mg/dL — ABNORMAL HIGH (ref 70–99)
Glucose-Capillary: 116 mg/dL — ABNORMAL HIGH (ref 70–99)
Glucose-Capillary: 148 mg/dL — ABNORMAL HIGH (ref 70–99)
Glucose-Capillary: 172 mg/dL — ABNORMAL HIGH (ref 70–99)

## 2020-04-18 LAB — SARS CORONAVIRUS 2 (TAT 6-24 HRS): SARS Coronavirus 2: NEGATIVE

## 2020-04-18 MED ORDER — LOSARTAN POTASSIUM 50 MG PO TABS: 50.0000 mg | ORAL_TABLET | Freq: Every day | ORAL | Status: DC

## 2020-04-18 MED ORDER — LIDOCAINE VISCOUS HCL 2 % MT SOLN
15.0000 mL | Freq: Once | OROMUCOSAL | Status: DC
  Filled 2020-04-18 (×2): qty 15

## 2020-04-18 MED ORDER — ALUM & MAG HYDROXIDE-SIMETH 200-200-20 MG/5ML PO SUSP
30.0000 mL | Freq: Once | ORAL | Status: AC
  Administered 2020-04-18: 30 mL via ORAL
  Filled 2020-04-18: qty 30

## 2020-04-18 MED ORDER — LOSARTAN POTASSIUM 25 MG PO TABS
25.0000 mg | ORAL_TABLET | Freq: Once | ORAL | Status: AC
  Administered 2020-04-18: 13:00:00 25 mg via ORAL
  Filled 2020-04-18: qty 1

## 2020-04-18 MED ORDER — LOSARTAN POTASSIUM 50 MG PO TABS
50.0000 mg | ORAL_TABLET | Freq: Every day | ORAL | Status: DC
  Administered 2020-04-19 – 2020-04-20 (×2): 50 mg via ORAL
  Filled 2020-04-18 (×2): qty 1

## 2020-04-18 NOTE — TOC Progression Note (Signed)
Transition of Care [facility]) - Progression Note    Patient Details  Name: Stephanie Ellis MRN: [id number] Date of Birth: 02-Feb-1951  Transition of Care [facility]) CM/SW [place], LCSW Phone Number: 04/18/2020, 3:58 PM  Clinical Narrative:  Plan for discharge to [facility] tomorrow after HD. Patient, daughter, and MD aware.   Expected Discharge Plan: [place] Barriers to Discharge: Continued Medical Work up  Expected Discharge Plan and Services Expected Discharge Plan: [place]   Discharge Planning Services: CM Consult                                           Social Determinants of Health (SDOH) Interventions    Readmission Risk Interventions Readmission Risk Prevention Plan 04/11/2020 05/04/2019  Transportation Screening Complete Complete  PCP or Specialist Appt within 3-5 Days - Complete  Palliative Care Screening - Not Applicable  Medication Review ([place]) Complete Complete  PCP or Specialist appointment within 3-5 days of discharge Complete -  [place] or Home Care Consult Complete -  SW Recovery Care/Counseling Consult Complete -  Palliative Care Screening Not Applicable -  [place] Complete -  Some recent data might be hidden

## 2020-04-18 NOTE — Progress Notes (Signed)
PROGRESS NOTE    Stephanie Ellis  VEH:[id number] DOB: 1950-06-08 DOA: 04/10/2020 PCP: Pcp, No   Brief Narrative:  This 69 y.o.femalewith PMH significant fordiabetes mellitus , ESRD on HD (M/W/F) , hypertension, PAD s/p bilateral BKA,coronary artery disease status post CABG who was brought to the emergency room by EMS for evaluation of mental status changes. Patient was recently discharged from the hospital on 04/06/20.During that hospitalization she had a brief PEA arrest aftershe received hydromorphone and recovered immediately following administration of Narcan. Her daughter states that since her discharge she has not been herusual self and has been acting very erratic. At baseline patient is able to take care of herself and activities of daily living such as transfers, grooming and feeding herself. Since her discharge she has been very weak and has been unable to feed herself or transfer to her wheelchair. Family is also concerned that her speech is slurred.Patient refused to go to dialysis on the morning of her admission and so EMS was called.  Patient was admitted with acute encephalopathy without clear cause.  MRI brain: no acute infarction, hemorrhage.  EEG: No evidence of seizures.  Ammonia level normal.  Neurology was consulted.  MRI does not show any evidence of stroke or anoxic brain injury.  Recommended continued thiamine treatment.  Nephrology consulted for continuation of hemodialysis.  Pulmonology consulted for mild loculated effusion on CT chest.  There is no signs of empyema and effusion is very small for thoracocentesis.  She completed treatment for community-acquired pneumonia.  Encourage incentive spirometry.  Her mental status has improved and back to baseline.  Assessment & Plan:   Principal Problem:   AMS (altered mental status) Active Problems:   ESRD (end stage renal disease) on dialysis ([place])   Type II diabetes mellitus with complication (HCC)    Hypertension   Seizure disorder (HCC)   Cirrhosis, cardiac   Acute Metabolic Encephalopathy >> Improved. Unclear etiology, at time of admission - ddx includes anoxic brain injury related to PEA arrest at last hospitalization, Encephalitis in setting of shingles, hypertensive encephalopathy. MRI brian without acute findings (chronic microvascular ischemic changes and few small chronic infarcts) EEG: No evidence of seizures. Neurology consulted.  Recommended to continue thiamine.  Keppra level 22.59  Ammonia level normal.  Vitamin B12 2900. Blood cultures: no growth so far. Mental status is improving.  She is alert and oriented x 2. ABG shows normal PCO2.  MRI ruled out anoxic brain injury. Patient could not complete hemodialysis 12/23.  She became less responsive during dialysis so it was held in between. Patient completed hemodialysis on 12/24.  She had hemodialysis while sitting in the chair. She will continue HD on MWF schedule. Her mental status has improved and appears at her baseline.  Mild Loculated Effusion : Assume this could be pneumonia.   Completed ceftriaxone and doxycycline for CAP for 7 days on 12/27. Blood cultures; No growth so far. Pulmonology consulted, recommended CAP treatment. There is no empyema and effusion is very small for thoracocentesis. Encourage incentive spirometry.  Physical deconditioning PT recommended SNF. Continue fall precautions.  Hypertensive urgency : Improving Home blood pressure medication resumed.  Carvedilol, hydralazine and Imdur. Blood pressure still remained elevated with increase in Imdur to 60 mg daily Hydralazine increased to 75 mg every 8 hrs. Added losartan 25 mg daily. IV hydralazine prn for SBP > 180 Finally blood pressure is improving and in a reasonable range.  E3MO with complications of end-stage renal disease on hemodialysis Maintain carb consistent diet.  Glycemic control with sliding scale insulin. Nephrology  consulted for renal replacement therapy. Patient could not complete hemodialysis 12/23.  She became less responsive during dialysis so it was held in between. Patient completed hemodialysis on 12/24 then will be continued on MWF schedule. She is on hemodialysis schedule Monday Wednesday and Friday.  Chronic combined systolic and diastolic dysfunction CHF Patient's last known LVEF is about 35 to 40% from an echocardiogram done in 2019 Stable and not acutely exacerbated. Volume management per renal.  Seizure disorder Continue Keppra. Continue seizure precautions. EEG: No evidence of seizures.  Thrombocytopenia Chronic, continue to monitor.  Peripheral arterial disease Status post bilateral BKA Continue statin, apixaban and carvedilol  Depression Continue Remeron.  Constipation: Continue Senokot and Colace   DVT prophylaxis: Eliquis Code Status: Full code Family Communication: No Family in the room Disposition Plan:   Status is: Inpatient  Remains inpatient appropriate because:Inpatient level of care appropriate due to severity of illness   Dispo: The patient is from: Home              Anticipated d/c is to:  SNF              Anticipated d/c date is:  >> 1 days.              Patient currently is not medically stable to d/c.   There is  Covid outbreak at the nursing home, no bed availability.   Anticipated discharge 12/29 after HD.  Consultants:   Pulmonology  Nephrology  Neurology  Procedures: EEG  Antimicrobials: Anti-infectives (From admission, onward)   Start     Dose/Rate Route Frequency Ordered Stop   04/11/20 1800  valACYclovir (VALTREX) tablet 500 mg        500 mg Oral Daily 04/10/20 2043     04/11/20 1330  doxycycline (VIBRA-TABS) tablet 100 mg        100 mg Oral Every 12 hours 04/11/20 1244 04/18/20 0959   04/11/20 1300  cefTRIAXone (ROCEPHIN) 2 g in sodium chloride 0.9 % 100 mL IVPB        2 g 200 mL/hr over 30 Minutes Intravenous Every 24  hours 04/11/20 1239 04/17/20 1510   04/11/20 1300  azithromycin (ZITHROMAX) 500 mg in sodium chloride 0.9 % 250 mL IVPB  Status:  Discontinued        500 mg 250 mL/hr over 60 Minutes Intravenous Every 24 hours 04/11/20 1239 04/11/20 1244   04/10/20 1500  valACYclovir (VALTREX) tablet 500 mg  Status:  Discontinued        500 mg Oral Daily 04/10/20 1446 04/10/20 2042      Subjective: Patient was seen and examined at bedside.  Overnight events noted.   She is alert and oriented x 3, seems like she is at her baseline mental status. Her blood pressure is improving and is in a reasonable range.  She denies any complaints.   Objective: Vitals:   04/17/20 1950 04/17/20 2125 04/17/20 2353 04/18/20 0457  BP: (!) 172/70 (!) 193/70 (!) 173/72 (!) 178/73  Pulse: 76 71 71 70  Resp: 15 16 16 16   Temp: 98.7 F (37.1 C) 99.1 F (37.3 C) 98.6 F (37 C) 97.9 F (36.6 C)  TempSrc: Oral Oral Oral Oral  SpO2: 100% 97% 94% 100%  Weight:      Height:        Intake/Output Summary (Last 24 hours) at 04/18/2020 1027 Last data filed at 04/17/2020 1950 Gross per 24 hour  Intake  240 ml  Output 500 ml  Net -260 ml   Filed Weights   04/10/20 1106  Weight: 50 kg    Examination:  General exam: Appears calm and comfortable, not in any distress. Respiratory system: Clear to auscultation. Respiratory effort normal. Cardiovascular system: S1 & S2 heard, RRR. No JVD, murmurs, rubs, gallops or clicks. No pedal edema. Gastrointestinal system: Abdomen is nondistended, soft and nontender. No organomegaly or masses felt. Normal bowel sounds heard. Central nervous system: Alert and oriented x 2. No focal neurological deficits. Extremities: Bilateral BKA.  No edema, no cyanosis, no clubbing. Skin: No rashes, lesions or ulcers. Psychiatry: Judgement and insight appear normal. Mood & affect appropriate.     Data Reviewed: I have personally reviewed following labs and imaging studies  CBC: Recent Labs   Lab 04/12/20 0553 04/13/20 0405 04/14/20 0454 04/15/20 0414 04/16/20 0439 04/17/20 0354  WBC 4.8 4.4  --   --  4.5  --   NEUTROABS 3.5  --   --   --   --   --   HGB 11.3* 11.5* 11.6* 11.0* 11.6* 11.4*  HCT 36.3 38.0 37.9 36.0 37.6 37.1  MCV 96.3 96.9  --   --  97.9  --   PLT 97* 95*  --   --  77*  --    Basic Metabolic Panel: Recent Labs  Lab 04/12/20 0553 04/13/20 0405 04/14/20 0454 04/15/20 0414 04/17/20 0354  NA 138 136 140 140 134*  K 3.7 4.1 4.4 4.0 4.7  CL 99 101 100 102 98  CO2 26 25 25 27 26   GLUCOSE 79 73 61* 78 122*  BUN 25* 37* 41* 17 33*  CREATININE 6.32* 8.01* 9.10* 5.38* 8.49*  CALCIUM 8.6* 8.1* 8.5* 8.5* 8.2*  MG 1.9 2.0 2.0  --  2.1  PHOS 3.3 4.1 4.2  --  3.8   GFR: Estimated Creatinine Clearance: 3.6 mL/min (A) (by C-G formula based on SCr of 8.49 mg/dL (H)). Liver Function Tests: Recent Labs  Lab 04/12/20 0553  AST 17  ALT 10  ALKPHOS 77  BILITOT 0.6  PROT 6.6  ALBUMIN 3.2*   No results for input(s): LIPASE, AMYLASE in the last 168 hours. No results for input(s): AMMONIA in the last 168 hours. Coagulation Profile: No results for input(s): INR, PROTIME in the last 168 hours. Cardiac Enzymes: No results for input(s): CKTOTAL, CKMB, CKMBINDEX, TROPONINI in the last 168 hours. BNP (last 3 results) No results for input(s): PROBNP in the last 8760 hours. HbA1C: No results for input(s): HGBA1C in the last 72 hours. CBG: Recent Labs  Lab 04/17/20 0725 04/17/20 1230 04/17/20 2125 04/17/20 2224 04/18/20 0813  GLUCAP 119* 126* 59* 105* 103*   Lipid Profile: No results for input(s): CHOL, HDL, LDLCALC, TRIG, CHOLHDL, LDLDIRECT in the last 72 hours. Thyroid Function Tests: No results for input(s): TSH, T4TOTAL, FREET4, T3FREE, THYROIDAB in the last 72 hours. Anemia Panel: No results for input(s): VITAMINB12, FOLATE, FERRITIN, TIBC, IRON, RETICCTPCT in the last 72 hours. Sepsis Labs: Recent Labs  Lab 04/12/20 0553  PROCALCITON 0.37     Recent Results (from the past 240 hour(s))  Resp Panel by RT-PCR (Flu A&B, Covid) Nasopharyngeal Swab     Status: None   Collection Time: 04/10/20 12:58 PM   Specimen: Nasopharyngeal Swab; Nasopharyngeal(NP) swabs in vial transport medium  Result Value Ref Range Status   SARS Coronavirus 2 by RT PCR NEGATIVE NEGATIVE Final    Comment: (NOTE) SARS-CoV-2 target nucleic acids are  NOT DETECTED.  The SARS-CoV-2 RNA is generally detectable in upper respiratory specimens during the acute phase of infection. The lowest concentration of SARS-CoV-2 viral copies this assay can detect is 138 copies/mL. A negative result does not preclude SARS-Cov-2 infection and should not be used as the sole basis for treatment or other patient management decisions. A negative result may occur with  improper specimen collection/handling, submission of specimen other than nasopharyngeal swab, presence of viral mutation(s) within the areas targeted by this assay, and inadequate number of viral copies(<138 copies/mL). A negative result must be combined with clinical observations, patient history, and epidemiological information. The expected result is Negative.  Fact Sheet for Patients:  EntrepreneurPulse.com.au  Fact Sheet for Healthcare Providers:  IncredibleEmployment.be  This test is no t yet approved or cleared by the [place] FDA and  has been authorized for detection and/or diagnosis of SARS-CoV-2 by FDA under an Emergency Use Authorization (EUA). This EUA will remain  in effect (meaning this test can be used) for the duration of the COVID-19 declaration under Section 564(b)(1) of the Act, 21 U.S.C.section 360bbb-3(b)(1), unless the authorization is terminated  or revoked sooner.       Influenza A by PCR NEGATIVE NEGATIVE Final   Influenza B by PCR NEGATIVE NEGATIVE Final    Comment: (NOTE) The Xpert Xpress SARS-CoV-2/FLU/RSV plus assay is intended as an  aid in the diagnosis of influenza from Nasopharyngeal swab specimens and should not be used as a sole basis for treatment. Nasal washings and aspirates are unacceptable for Xpert Xpress SARS-CoV-2/FLU/RSV testing.  Fact Sheet for Patients: EntrepreneurPulse.com.au  Fact Sheet for Healthcare Providers: IncredibleEmployment.be  This test is not yet approved or cleared by the [place] FDA and has been authorized for detection and/or diagnosis of SARS-CoV-2 by FDA under an Emergency Use Authorization (EUA). This EUA will remain in effect (meaning this test can be used) for the duration of the COVID-19 declaration under Section 564(b)(1) of the Act, 21 U.S.C. section 360bbb-3(b)(1), unless the authorization is terminated or revoked.  Performed at [facility], [address] [place], [place] [address]   CULTURE, BLOOD (ROUTINE X 2) w Reflex to ID Panel     Status: None   Collection Time: 04/11/20  6:42 PM   Specimen: BLOOD  Result Value Ref Range Status   Specimen Description BLOOD BLOOD LEFT HAND  Final   Special Requests   Final    BOTTLES DRAWN AEROBIC AND ANAEROBIC Blood Culture results may not be optimal due to an inadequate volume of blood received in culture bottles   Culture   Final    NO GROWTH 5 DAYS Performed at [facility], [address] [place], [place] [address]    Report Status 04/16/2020 FINAL  Final  CULTURE, BLOOD (ROUTINE X 2) w Reflex to ID Panel     Status: None   Collection Time: 04/11/20  6:50 PM   Specimen: BLOOD  Result Value Ref Range Status   Specimen Description BLOOD BLOOD LEFT FOREARM  Final   Special Requests   Final    BOTTLES DRAWN AEROBIC AND ANAEROBIC Blood Culture adequate volume   Culture   Final    NO GROWTH 5 DAYS Performed at [facility], [address], [place], [place] [address]    Report Status 04/16/2020 FINAL  Final     Radiology Studies: DG Chest 2  View  Result Date: 04/17/2020 CLINICAL DATA:  Pneumonia EXAM: CHEST - 2 VIEW COMPARISON:  04/10/2020  FINDINGS: CABG changes with cardiac enlargement. Mild pulmonary vascular congestion has progressed. Small left pleural effusion has progressed. Small right effusion unchanged. Left lower lobe atelectasis/infiltrate with mild progression. Improved aeration right lung base. Left jugular central venous catheter tip in the right atrium unchanged. No pneumothorax IMPRESSION: Cardiac enlargement. Progressive mild vascular congestion and small left effusion suggesting fluid overload. Left lower lobe atelectasis/infiltrate with progression. Improved aeration right lung base. Electronically Signed   By: Franchot Gallo M.D.   On: 04/17/2020 08:26   Scheduled Meds: . apixaban  2.5 mg Oral BID  . aspirin EC  81 mg Oral Daily  . atorvastatin  80 mg Oral QHS  . brimonidine  1 drop Both Eyes TID  . carvedilol  37.5 mg Oral BID WC  . Chlorhexidine Gluconate Cloth  6 each Topical Q0600  . fluticasone  2 spray Each Nare Daily  . hydrALAZINE  75 mg Oral Q8H  . insulin aspart  0-6 Units Subcutaneous TID WC  . ipratropium  2 spray Each Nare BID  . isosorbide mononitrate  60 mg Oral Daily  . levETIRAcetam  250 mg Oral Q M,W,F-HD  . levETIRAcetam  500 mg Oral Q12H  . losartan  25 mg Oral Daily  . mirtazapine  7.5 mg Oral QHS  . omega-3 acid ethyl esters  2 g Oral Daily  . pantoprazole  40 mg Oral Daily  . rOPINIRole  0.25 mg Oral QHS  . senna-docusate  1 tablet Oral BID  . sevelamer carbonate  1,600 mg Oral TID WC  . sodium chloride flush  3 mL Intravenous Q12H  . sodium chloride flush  3 mL Intravenous Q12H  . [START ON 04/20/2020] thiamine injection  100 mg Intravenous Daily  . timolol  1 drop Both Eyes TID  . triamcinolone   Topical BID  . valACYclovir  500 mg Oral Daily  . vitamin B-12  1,000 mcg Oral Daily   Continuous Infusions: . sodium chloride    . levETIRAcetam    . thiamine injection 250 mg  (04/18/20 1011)     LOS: 8 days    Time spent: 25 mins.    Shawna Clamp, MD [facility]   If 7PM-7AM, please contact night-coverage

## 2020-04-18 NOTE — Progress Notes (Signed)
Physical Therapy Treatment Patient Details Name: Stephanie Ellis MRN: [id number] DOB: December 19, 1950 Today's Date: 04/18/2020    History of Present Illness Stephanie Ellis is a 22yoF who comes [facility] on [facility]20 c AMS. PMH: DM, ESRD on HD MWF, HTN, PAD s/p bilateral BKA, CAD, CAD s/p CABG, left eye blindnss, heavy right eye visual impairment.    PT Comments    Pt was supine in bed with HOB elevated ~ 20 degrees. She is alert and conversational throughout. Oriented x 3 however able to follow commands consistently throughout. Pt reports she has not ambulated in awhile. BLE prosthetics in room and pt agreeable to trial. She required min assist to achieve EOB short sit. Applied BLEs (prosthetics) in sitting. Pt stood and ambulated with RW + Mod assist 12 ft. Does endorse slight fatigue but overall tolerated well. Pt returned to bed post ambulation. Pt has had poor intake and was agreeable to eating protein rich [place] ice. Pt will benfit form skilled PT at DC to address deficits while progressing safe functional mobility. She was in bed with bed alarm set and call bell in reach at conclusion of session.     Follow Up Recommendations  SNF     Equipment Recommendations  None recommended by PT       Precautions / Restrictions Precautions Precautions: Fall Restrictions Weight Bearing Restrictions: No    Mobility  Bed Mobility Overal bed mobility: Needs Assistance Bed Mobility: Supine to Sit;Sit to Supine     Supine to sit: Min assist Sit to supine: Supervision   General bed mobility comments: Min assist to exit R side of bed. Supervision to return to supine after OOB activity. therapist assisted with applying BLE prosthetics while pt sat EOB  Transfers Overall transfer level: Needs assistance Equipment used: Rolling walker (2 wheeled) (BLE prosthetics) Transfers: Sit to/from Stand Sit to Stand: Min assist;From elevated surface;Mod assist         General transfer comment: min/mod assist  to stand 3 x EOB.  Ambulation/Gait Ambulation/Gait assistance: Mod assist Gait Distance (Feet): 12 Feet Assistive device: Rolling walker (2 wheeled) Gait Pattern/deviations: Step-through pattern;Trunk flexed Gait velocity: decreased   General Gait Details: pt was able to ambulate 12 ft with slow step to pattern. vcs for imporved lateral wt shift to allow opposite LE advancement.       Balance Overall balance assessment: Needs assistance Sitting-balance support: Feet supported;No upper extremity supported Sitting balance-Leahy Scale: Fair Sitting balance - Comments: sat EOB x > ten minutes throughout session   Standing balance support: During functional activity;Bilateral upper extremity supported Standing balance-Leahy Scale: Poor Standing balance comment: reliant on BUE support to maintain standing balance. required mod assist during gait for safety at time with mostly min assist throughout         Cognition Arousal/Alertness: Awake/alert Behavior During Therapy: [facility] for tasks assessed/performed Overall Cognitive Status: No family/caregiver present to determine baseline cognitive functioning (was appropriate and able to follow commands consistently)      General Comments: Pt is A and O x 3. follows commands consistently throughout.             Pertinent Vitals/Pain Pain Assessment: No/denies pain Pain Score: 0-No pain           PT Goals (current goals can now be found in the care plan section) Acute Rehab PT Goals Patient Stated Goal: none stated Progress towards PT goals: Progressing toward goals    Frequency    Min 2X/week  PT Plan Current plan remains appropriate       AM-PAC PT "6 Clicks" Mobility   Outcome Measure  Help needed turning from your back to your side while in a flat bed without using bedrails?: A Little Help needed moving from lying on your back to sitting on the side of a flat bed without using bedrails?: A Little Help needed  moving to and from a bed to a chair (including a wheelchair)?: A Lot Help needed standing up from a chair using your arms (e.g., wheelchair or bedside chair)?: A Lot Help needed to walk in hospital room?: A Lot Help needed climbing 3-5 steps with a railing? : A Lot 6 Click Score: 14    End of Session Equipment Utilized During Treatment: Gait belt Activity Tolerance: Patient tolerated treatment well Patient left: in bed;with call bell/phone within reach;with bed alarm set Nurse Communication: Mobility status PT Visit Diagnosis: Difficulty in walking, not elsewhere classified (R26.2);Other abnormalities of gait and mobility (R26.89);Muscle weakness (generalized) (M62.81);Other symptoms and signs involving the nervous system (R29.898)     Time: 6728-9791 PT Time Calculation (min) (ACUTE ONLY): 28 min  Charges:  $Gait Training: 8-22 mins $Therapeutic Activity: 8-22 mins                     Julaine Fusi PTA 04/18/20, 3:31 PM

## 2020-04-18 NOTE — Progress Notes (Signed)
[facility]  ROUNDING NOTE   Subjective:   [name] was admitted to [facility] on 04/10/2020 for Slurred speech [R47.81] Altered mental status, unspecified altered mental status type [R41.82] AMS (altered mental status) [R41.82]  Patient underwent hemodialysis treatment yesterday. Tolerated well. Resting comfortably in bed at the moment.     Objective:  Vital signs in last 24 hours:  Temp:  [97.7 F (36.5 C)-99.1 F (37.3 C)] 97.9 F (36.6 C) (12/28 0457) Pulse Rate:  [58-76] 70 (12/28 0457) Resp:  [10-27] 16 (12/28 0457) BP: (160-193)/(60-98) 178/73 (12/28 0457) SpO2:  [94 %-100 %] 100 % (12/28 0457)  Weight change:  Filed Weights   04/10/20 1106  Weight: 50 kg    Intake/Output: I/O last 3 completed shifts: In: 240 [P.O.:240] Out: 500 [Other:500]   Intake/Output this shift:  No intake/output data recorded.  Physical Exam: General:  Resting in bed, in no acute distress  Head:  Oral mucous membranes moist  Eyes:  Sclerae and conjunctivae clear  Lungs:   Clear bilaterally, normal effort  Heart:  S1S2, no rubs or gallops  Abdomen:   Soft, nontender, nondistended  Extremities:  bilateral lower extremity amputations.  Neurologic:  Awake, alert     Access: LIJ permcath    Basic Metabolic Panel: Recent Labs  Lab 04/12/20 0553 04/13/20 0405 04/14/20 0454 04/15/20 0414 04/17/20 0354  NA 138 136 140 140 134*  K 3.7 4.1 4.4 4.0 4.7  CL 99 101 100 102 98  CO2 26 25 25 27 26   GLUCOSE 79 73 61* 78 122*  BUN 25* 37* 41* 17 33*  CREATININE 6.32* 8.01* 9.10* 5.38* 8.49*  CALCIUM 8.6* 8.1* 8.5* 8.5* 8.2*  MG 1.9 2.0 2.0  --  2.1  PHOS 3.3 4.1 4.2  --  3.8    Liver Function Tests: Recent Labs  Lab 04/12/20 0553  AST 17  ALT 10  ALKPHOS 77  BILITOT 0.6  PROT 6.6  ALBUMIN 3.2*   No results for input(s): LIPASE, AMYLASE in the last 168 hours. No results for input(s): AMMONIA in the last 168 hours.  CBC: Recent Labs  Lab 04/12/20 0553  04/13/20 0405 04/14/20 0454 04/15/20 0414 04/16/20 0439 04/17/20 0354  WBC 4.8 4.4  --   --  4.5  --   NEUTROABS 3.5  --   --   --   --   --   HGB 11.3* 11.5* 11.6* 11.0* 11.6* 11.4*  HCT 36.3 38.0 37.9 36.0 37.6 37.1  MCV 96.3 96.9  --   --  97.9  --   PLT 97* 95*  --   --  77*  --     Cardiac Enzymes: No results for input(s): CKTOTAL, CKMB, CKMBINDEX, TROPONINI in the last 168 hours.  BNP: Invalid input(s): POCBNP  CBG: Recent Labs  Lab 04/17/20 0725 04/17/20 1230 04/17/20 2125 04/17/20 2224 04/18/20 0813  GLUCAP 119* 126* 59* 105* 103*    Microbiology: Results for orders placed or performed during the hospital encounter of 04/10/20  Resp Panel by RT-PCR (Flu A&B, Covid) Nasopharyngeal Swab     Status: None   Collection Time: 04/10/20 12:58 PM   Specimen: Nasopharyngeal Swab; Nasopharyngeal(NP) swabs in vial transport medium  Result Value Ref Range Status   SARS Coronavirus 2 by RT PCR NEGATIVE NEGATIVE Final    Comment: (NOTE) SARS-CoV-2 target nucleic acids are NOT DETECTED.  The SARS-CoV-2 RNA is generally detectable in upper respiratory specimens during the acute phase of infection. The  lowest concentration of SARS-CoV-2 viral copies this assay can detect is 138 copies/mL. A negative result does not preclude SARS-Cov-2 infection and should not be used as the sole basis for treatment or other patient management decisions. A negative result may occur with  improper specimen collection/handling, submission of specimen other than nasopharyngeal swab, presence of viral mutation(s) within the areas targeted by this assay, and inadequate number of viral copies(<138 copies/mL). A negative result must be combined with clinical observations, patient history, and epidemiological information. The expected result is Negative.  Fact Sheet for Patients:  EntrepreneurPulse.com.au  Fact Sheet for Healthcare Providers:   IncredibleEmployment.be  This test is no t yet approved or cleared by the [place] FDA and  has been authorized for detection and/or diagnosis of SARS-CoV-2 by FDA under an Emergency Use Authorization (EUA). This EUA will remain  in effect (meaning this test can be used) for the duration of the COVID-19 declaration under Section 564(b)(1) of the Act, 21 U.S.C.section 360bbb-3(b)(1), unless the authorization is terminated  or revoked sooner.       Influenza A by PCR NEGATIVE NEGATIVE Final   Influenza B by PCR NEGATIVE NEGATIVE Final    Comment: (NOTE) The Xpert Xpress SARS-CoV-2/FLU/RSV plus assay is intended as an aid in the diagnosis of influenza from Nasopharyngeal swab specimens and should not be used as a sole basis for treatment. Nasal washings and aspirates are unacceptable for Xpert Xpress SARS-CoV-2/FLU/RSV testing.  Fact Sheet for Patients: EntrepreneurPulse.com.au  Fact Sheet for Healthcare Providers: IncredibleEmployment.be  This test is not yet approved or cleared by the [place] FDA and has been authorized for detection and/or diagnosis of SARS-CoV-2 by FDA under an Emergency Use Authorization (EUA). This EUA will remain in effect (meaning this test can be used) for the duration of the COVID-19 declaration under Section 564(b)(1) of the Act, 21 U.S.C. section 360bbb-3(b)(1), unless the authorization is terminated or revoked.  Performed at [facility], [address] [place], [place] [address]   CULTURE, BLOOD (ROUTINE X 2) w Reflex to ID Panel     Status: None   Collection Time: 04/11/20  6:42 PM   Specimen: BLOOD  Result Value Ref Range Status   Specimen Description BLOOD BLOOD LEFT HAND  Final   Special Requests   Final    BOTTLES DRAWN AEROBIC AND ANAEROBIC Blood Culture results may not be optimal due to an inadequate volume of blood received in culture bottles   Culture   Final     NO GROWTH 5 DAYS Performed at [facility], [address] [place], [place] [address]    Report Status 04/16/2020 FINAL  Final  CULTURE, BLOOD (ROUTINE X 2) w Reflex to ID Panel     Status: None   Collection Time: 04/11/20  6:50 PM   Specimen: BLOOD  Result Value Ref Range Status   Specimen Description BLOOD BLOOD LEFT FOREARM  Final   Special Requests   Final    BOTTLES DRAWN AEROBIC AND ANAEROBIC Blood Culture adequate volume   Culture   Final    NO GROWTH 5 DAYS Performed at [facility], [address] [place], [place] [address]    Report Status 04/16/2020 FINAL  Final    Coagulation Studies: No results for input(s): LABPROT, INR in the last 72 hours.  Urinalysis: No results for input(s): COLORURINE, LABSPEC, PHURINE, GLUCOSEU, HGBUR, BILIRUBINUR, KETONESUR, PROTEINUR, UROBILINOGEN, NITRITE, LEUKOCYTESUR in the last 72 hours.  Invalid input(s): APPERANCEUR    Imaging:  DG Chest 2 View  Result Date: 04/17/2020 CLINICAL DATA:  Pneumonia EXAM: CHEST - 2 VIEW COMPARISON:  04/10/2020 FINDINGS: CABG changes with cardiac enlargement. Mild pulmonary vascular congestion has progressed. Small left pleural effusion has progressed. Small right effusion unchanged. Left lower lobe atelectasis/infiltrate with mild progression. Improved aeration right lung base. Left jugular central venous catheter tip in the right atrium unchanged. No pneumothorax IMPRESSION: Cardiac enlargement. Progressive mild vascular congestion and small left effusion suggesting fluid overload. Left lower lobe atelectasis/infiltrate with progression. Improved aeration right lung base. Electronically Signed   By: Franchot Gallo M.D.   On: 04/17/2020 08:26     Medications:    Scheduled Meds: . apixaban  2.5 mg Oral BID  . aspirin EC  81 mg Oral Daily  . atorvastatin  80 mg Oral QHS  . brimonidine  1 drop Both Eyes TID  . carvedilol  37.5 mg Oral BID WC  . Chlorhexidine Gluconate Cloth  6  each Topical Q0600  . fluticasone  2 spray Each Nare Daily  . hydrALAZINE  75 mg Oral Q8H  . insulin aspart  0-6 Units Subcutaneous TID WC  . ipratropium  2 spray Each Nare BID  . isosorbide mononitrate  60 mg Oral Daily  . levETIRAcetam  250 mg Oral Q M,W,F-HD  . levETIRAcetam  500 mg Oral Q12H  . losartan  25 mg Oral Daily  . mirtazapine  7.5 mg Oral QHS  . omega-3 acid ethyl esters  2 g Oral Daily  . pantoprazole  40 mg Oral Daily  . rOPINIRole  0.25 mg Oral QHS  . senna-docusate  1 tablet Oral BID  . sevelamer carbonate  1,600 mg Oral TID WC  . sodium chloride flush  3 mL Intravenous Q12H  . sodium chloride flush  3 mL Intravenous Q12H  . [START ON 04/20/2020] thiamine injection  100 mg Intravenous Daily  . timolol  1 drop Both Eyes TID  . triamcinolone   Topical BID  . valACYclovir  500 mg Oral Daily  . vitamin B-12  1,000 mcg Oral Daily   Continuous Infusions: . sodium chloride    . levETIRAcetam    . thiamine injection 250 mg (04/18/20 1011)   PRN Meds:.sodium chloride, hydrALAZINE, meclizine, ondansetron **OR** ondansetron (ZOFRAN) IV, polyvinyl alcohol, sodium chloride flush    Assessment/ Plan:  [name] is a 69 y.o. black female with end stage renal disease on hemodialysis, peripheral vascular disease status post bilateral BKA, diabetes mellitus type II, coronary artery disease status post CABG admitted to [facility] on 04/10/2020 for Slurred speech [R47.81] Altered mental status, unspecified altered mental status type [R41.82] AMS (altered mental status) [R41.82]  [facility] MWF [facility] [address] LIJ permcath 52kg  #  End Stage Renal Disease Patient underwent hemodialysis treatment yesterday.  No acute indication for dialysis today.  We will plan for dialysis treatment again tomorrow.   # Anemia of chronic kidney disease Lab Results  Component Value Date   HGB 11.4 (L) 04/17/2020   Hemoglobin at target at 11.4.  Continue to day periodically  monitor.  #Secondary Hyperparathyroidism:  Lab Results  Component Value Date   PTH 385 (H) 09/03/2019   CALCIUM 8.2 (L) 04/17/2020   PHOS 3.8 04/17/2020  Phosphorus at target at 3.8.  #Shingles   Patient is on valacyclovir lesions on the left chest sidewall.  #Altered mental status EEG-no seizures seen Likely acute metabolic encephalopathy-improving Had an episode of altered mental status during dialysis on Thursday, ?  Due to low blood pressure Patient became responsive as soon as blood was returned Tolerated dialysis well on Friday.  No acute events Getting treatment with Rocephin and doxycycline for presumed pneumonia.     LOS: 8 Jakalyn Kratky 12/28/202111:33 AM

## 2020-04-19 DIAGNOSIS — R410 Disorientation, unspecified: Secondary | ICD-10-CM | POA: Diagnosis not present

## 2020-04-19 LAB — BASIC METABOLIC PANEL
Anion gap: 11 (ref 5–15)
BUN: 19 mg/dL (ref 8–23)
CO2: 27 mmol/L (ref 22–32)
Calcium: 8.6 mg/dL — ABNORMAL LOW (ref 8.9–10.3)
Chloride: 99 mmol/L (ref 98–111)
Creatinine, Ser: 6.9 mg/dL — ABNORMAL HIGH (ref 0.44–1.00)
GFR, Estimated: 6 mL/min — ABNORMAL LOW (ref >60)
Glucose, Bld: 113 mg/dL — ABNORMAL HIGH (ref 70–99)
Potassium: 4.1 mmol/L (ref 3.5–5.1)
Sodium: 137 mmol/L (ref 135–145)

## 2020-04-19 LAB — GLUCOSE, CAPILLARY
Glucose-Capillary: 76 mg/dL (ref 70–99)
Glucose-Capillary: 105 mg/dL — ABNORMAL HIGH (ref 70–99)
Glucose-Capillary: 141 mg/dL — ABNORMAL HIGH (ref 70–99)
Glucose-Capillary: 161 mg/dL — ABNORMAL HIGH (ref 70–99)

## 2020-04-19 LAB — HEMOGLOBIN AND HEMATOCRIT, BLOOD
HCT: 36.4 % (ref 36.0–46.0)
Hemoglobin: 11.2 g/dL — ABNORMAL LOW (ref 12.0–15.0)

## 2020-04-19 LAB — MAGNESIUM: Magnesium: 2 mg/dL (ref 1.7–2.4)

## 2020-04-19 LAB — PHOSPHORUS
Phosphorus: 3.2 mg/dL (ref 2.5–4.6)
Phosphorus: 1.8 mg/dL — ABNORMAL LOW (ref 2.5–4.6)

## 2020-04-19 MED ORDER — HYDRALAZINE HCL 25 MG PO TABS: 75.0000 mg | ORAL_TABLET | Freq: Three times a day (TID) | ORAL | Status: AC

## 2020-04-19 MED ORDER — TRAMADOL HCL 50 MG PO TABS
50.0000 mg | ORAL_TABLET | Freq: Once | ORAL | Status: AC
Start: 2020-04-19 — End: 2020-04-19
  Administered 2020-04-19: 13:00:00 50 mg via ORAL
  Filled 2020-04-19: qty 1

## 2020-04-19 MED ORDER — ISOSORBIDE MONONITRATE ER 60 MG PO TB24: 60.0000 mg | ORAL_TABLET | Freq: Every day | ORAL | Status: AC

## 2020-04-19 MED ORDER — THIAMINE HCL 100 MG PO TABS: 100.0000 mg | ORAL_TABLET | Freq: Every day | ORAL | Status: AC

## 2020-04-19 MED ORDER — SENNOSIDES-DOCUSATE SODIUM 8.6-50 MG PO TABS: 1.0000 | ORAL_TABLET | Freq: Two times a day (BID) | ORAL | Status: AC

## 2020-04-19 MED ORDER — LOSARTAN POTASSIUM 50 MG PO TABS: 50.0000 mg | ORAL_TABLET | Freq: Every day | ORAL | Status: AC

## 2020-04-19 NOTE — Discharge Summary (Signed)
Physician Discharge Summary  Carmelite Violet DXI:[id number] DOB: 03/22/1951 DOA: 04/10/2020  PCP: Pcp, No  Admit date: 04/10/2020 Discharge date: 04/19/2020  Admitted From: Home Disposition:  SNF  Discharge Condition:Stable CODE STATUS:FULL Diet recommendation: renal   Brief/Interim Summary:  This 69 y.o.femalewith PMH significant fordiabetes mellitus , ESRD on HD (M/W/F) , hypertension, PAD s/p bilateral BKA,coronary artery disease status post CABG who was brought to the emergency room by EMS for evaluation of mental status changes. Patient was recently discharged from the hospital on 04/06/20.During that hospitalization she had a brief PEA arrest aftershe received hydromorphone and recovered immediately following administration of Narcan. Her daughter states that since her discharge she has not been herusual self and has been acting very erratic. At baseline patient is able to take care of herself and activities of daily living such as transfers, grooming and feeding herself. Since her discharge she has been very weak and has been unable to feed herself or transfer to her wheelchair. Family is also concerned that her speech is slurred.Patient refused to go to dialysis on the morning of her admission and so EMS was called. Patient was admitted with acute encephalopathy without clear cause.  MRI brain: no acute infarction, hemorrhage.  EEG: No evidence of seizures.  Ammonia level normal.  Neurology was consulted.  MRI does not show any evidence of stroke or anoxic brain injury.  Recommended continued thiamine treatment.  Nephrology consulted for continuation of hemodialysis.  Pulmonology consulted for mild loculated effusion on CT chest.  There is no signs of empyema and effusion is very small for thoracocentesis.  She completed treatment for community-acquired pneumonia.  Encourage incentive spirometry.  Her mental status has improved and back to baseline. She has been seen by PT and OT  and recommended a skilled nursing facility on discharge.  She is medically stable for discharge to SNF today.   Following problems were addressed during her hospitalization:   Acute Metabolic Encephalopathy >> Improved. Unclear etiology, at time of admission - ddx includes anoxic brain injury related to PEA arrest at last hospitalization, Encephalitis in setting of shingles, hypertensive encephalopathy. MRI brian without acute findings (chronic microvascular ischemic changes and few small chronic infarcts) EEG: No evidence of seizures. Neurology consulted.  Recommended to continue thiamine.  Keppra level 22.59  Ammonia level normal.  Vitamin B12 2900. Blood cultures: no growth so far. ABG shows normal PCO2.  MRI ruled out anoxic brain injury. Patient completed hemodialysis on 12/24.  She had hemodialysis while sitting in the chair. She will continue HD on MWF schedule. Her mental status has improved and appears at her baseline.  Mild Loculated Effusion : Assume this could be pneumonia.   Completed ceftriaxone and doxycycline for CAP for 7 days on 12/27. Blood cultures; No growth so far. Pulmonology consulted, recommended CAP treatment. There is no empyema and effusion is very small for thoracocentesis. Encourage incentive spirometry. Currently she is on room air.  Physical deconditioning PT recommended SNF. Continue fall precautions.  Hypertensive urgency : Improving Home blood pressure medication resumed.  Carvedilol, hydralazine and Imdur. Blood pressure still remained elevated with increase in Imdur to 60 mg daily Hydralazine increased to 75 mg every 8 hrs. Added losartan 25 mg daily. Finally blood pressure is improving and in a reasonable range.  [id number] complications of end-stage renal disease on hemodialysis. Glycemic control with sliding scale insulin. Nephrology consulted for renal replacement therapy. Patient could not complete hemodialysis 12/23.  She became  less responsive during dialysis so it was  held in between. Patient completed hemodialysis on 12/24 then will be continued on MWF schedule. She is on hemodialysis schedule Monday Wednesday and Friday.  Chronic combined systolic and diastolic dysfunction CHF Patient's last known LVEF is about 35 to 40% from an echocardiogram done in 2019 Stable and not acutely exacerbated.  Seizure disorder Continue Keppra. Continue seizure precautions. EEG: No evidence of seizures.  Thrombocytopenia Chronic, continue to monitor.  Peripheral arterial disease Status post bilateral BKA Continue statin, apixaban and carvedilol  Depression Continue Remeron.  Constipation: Continue Senokot     Discharge Diagnoses:  Principal Problem:   AMS (altered mental status) Active Problems:   ESRD (end stage renal disease) on dialysis (HCC)   Type II diabetes mellitus with complication (HCC)   Hypertension   Seizure disorder (HCC)   Cirrhosis, cardiac    Discharge Instructions  Discharge Instructions    Diet - low sodium heart healthy   Complete by: As directed    Discharge instructions   Complete by: As directed    1)Please take prescribed medications as instructed 2)Do a CBC and BMP tests in a week   Increase activity slowly   Complete by: As directed      Allergies as of 04/19/2020      Reactions   Chlorthalidone Anaphylaxis, Itching, Rash   Dilaudid [hydromorphone Hcl] Anaphylaxis   Hypersensitivity. 0.68m caused pt to become unresponsive   Fentanyl Rash   Midazolam Rash   Morphine And Related Anaphylaxis   Pt unresponsive, apneic after 0.572mdilaudid    Ace Inhibitors Other (See Comments)   Reaction:  Hyperkalemia, agitation    Angiotensin Receptor Blockers Other (See Comments)   Reaction:  Hyperkalemia, agitation    Metoclopramide Other (See Comments)   Norvasc [amlodipine] Itching, Rash   Phenergan [promethazine Hcl] Anxiety   "antsy, can't sit still"       Medication List    STOP taking these medications   cloNIDine 0.1 MG tablet Commonly known as: CATAPRES   traMADol 50 MG tablet Commonly known as: ULTRAM   valACYclovir 500 MG tablet Commonly known as: VALTREX     TAKE these medications   apixaban 2.5 MG Tabs tablet Commonly known as: ELIQUIS Take 1 tablet (2.5 mg total) by mouth 2 (two) times daily.   aspirin EC 81 MG tablet Take 1 tablet (81 mg total) by mouth daily.   atorvastatin 80 MG tablet Commonly known as: LIPITOR Take 80 mg by mouth at bedtime.   brimonidine 0.2 % ophthalmic solution Commonly known as: ALPHAGAN Place 1 drop into both eyes 3 (three) times daily.   carvedilol 25 MG tablet Commonly known as: COREG Take 1 tablet (25 mg total) by mouth 2 (two) times daily.   carvedilol 12.5 MG tablet Commonly known as: COREG Take 12.5 mg by mouth 2 (two) times daily. Take in combination with 25 mg for total of 37.5 mg twice daily   fluticasone 50 MCG/ACT nasal spray Commonly known as: FLONASE Place 2 sprays into both nostrils daily.   hydrALAZINE 25 MG tablet Commonly known as: APRESOLINE Take 3 tablets (75 mg total) by mouth every 8 (eight) hours. What changed:   medication strength  how much to take   insulin aspart 100 UNIT/ML injection Commonly known as: novoLOG Inject 0-6 Units into the skin See admin instructions. Inject under the skin according to blood glucose reading:  <201: 0u 201- 250: 2u 251-300: 4u 301-350: 5u 351-400: 6u   insulin glargine 100 UNIT/ML injection Commonly known as:  LANTUS Inject 5 Units into the skin daily in the afternoon.   insulin lispro 100 UNIT/ML KwikPen Commonly known as: HUMALOG Inject 2-6 Units into the skin 3 (three) times daily before meals.   ipratropium 0.06 % nasal spray Commonly known as: ATROVENT Place 2 sprays into both nostrils 2 (two) times daily.   isosorbide mononitrate 60 MG 24 hr tablet Commonly known as: IMDUR Take 1 tablet (60 mg  total) by mouth daily. What changed:   medication strength  how much to take   levETIRAcetam 250 MG tablet Commonly known as: KEPPRA Three times weekly after dialysis What changed:   how much to take  how to take this  when to take this  additional instructions   levETIRAcetam 500 MG tablet Commonly known as: KEPPRA Take 500 mg by mouth 2 (two) times daily. What changed: Another medication with the same name was changed. Make sure you understand how and when to take each.   losartan 50 MG tablet Commonly known as: COZAAR Take 1 tablet (50 mg total) by mouth daily.   meclizine 25 MG tablet Commonly known as: ANTIVERT Take 25 mg by mouth every 4 (four) hours as needed for dizziness.   mirtazapine 15 MG tablet Commonly known as: REMERON Take 7.5 mg by mouth daily.   Omega-3 1000 MG Caps Take 2 capsules by mouth daily.   pantoprazole 40 MG tablet Commonly known as: PROTONIX Take 1 tablet (40 mg total) by mouth daily.   Refresh 1.4-0.6 % Soln Generic drug: Polyvinyl Alcohol-Povidone PF Place 1 drop into both eyes 2 (two) times a day.   rOPINIRole 0.25 MG tablet Commonly known as: REQUIP Take 0.25 mg by mouth at bedtime.   senna-docusate 8.6-50 MG tablet Commonly known as: Senokot-S Take 1 tablet by mouth 2 (two) times daily.   sevelamer carbonate 800 MG tablet Commonly known as: RENVELA 2 Tablet(s) By Mouth 3 Times Daily   thiamine 100 MG tablet Take 1 tablet (100 mg total) by mouth daily.   timolol 0.5 % ophthalmic solution Commonly known as: TIMOPTIC Place 1 drop into both eyes 3 (three) times daily.   triamcinolone 0.1 % Commonly known as: KENALOG Apply topically.   vitamin B-12 1000 MCG tablet Commonly known as: CYANOCOBALAMIN Take 1 tablet (1,000 mcg total) by mouth daily.       Contact information for after-discharge care    Destination    [facility] Preferred SNF .   Service: Skilled Nursing Contact  information: [place] [phone]                 Allergies  Allergen Reactions  . Chlorthalidone Anaphylaxis, Itching and Rash  . Dilaudid [Hydromorphone Hcl] Anaphylaxis    Hypersensitivity. 0.74m caused pt to become unresponsive  . Fentanyl Rash  . Midazolam Rash  . Morphine And Related Anaphylaxis    Pt unresponsive, apneic after 0.591mdilaudid   . Ace Inhibitors Other (See Comments)    Reaction:  Hyperkalemia, agitation   . Angiotensin Receptor Blockers Other (See Comments)    Reaction:  Hyperkalemia, agitation   . Metoclopramide Other (See Comments)  . Norvasc [Amlodipine] Itching and Rash  . Phenergan [Promethazine Hcl] Anxiety    "antsy, can't sit still"    Consultations: nephrology   Procedures/Studies: EEG  Result Date: 04/12/2020 YaLora HavensMD     04/12/2020 11:37 AM Patient Name: DeSolyana NonakaRN: [id number] Attending: PrLora Havenseferring Physician/Provider: Dr. CaMarcelline Deist  Powell. Date: 04/11/2020 Duration: 21.55 minutes Patient history: 68 year old female with history of epilepsy who presented with altered mental status.  EEG evaluate for seizures. Level of alertness: Awake, asleep AEDs during EEG study: Keppra. Technical aspects: This EEG study was done with scalp electrodes positioned according to the 10-20 International system of electrode placement. Electrical activity was acquired at a sampling rate of 500Hz  and reviewed with a high frequency filter of 70Hz  and a low frequency filter of 1Hz . EEG data were recorded continuously and digitally stored. Description: During awake state, no clear posterior dominant rhythm was seen.  Sleep was characterized by vertex waves, sleep spindles (12 to 14 Hz), maximal frontocentral region.  EEG showed continuous generalized 3 to 5 Hz theta-delta slowing.  Intermittent rhythmic generalized delta activity was also noted.  Sharp transients were seen in bilateral  posterior quadrant.  Hyperventilation and photic stimulation were not performed.   ABNORMALITY -Continuous slow, generalized -Intermittent rhythmic delta activity, generalized IMPRESSION: This study is suggestive of moderate diffuse encephalopathy, nonspecific etiology.  No seizures or definite epileptiform discharges were seen throughout the recording. Priyanka Barbra Sarks   CT ABDOMEN PELVIS WO CONTRAST  Result Date: 04/02/2020 CLINICAL DATA:  Left upper quadrant abdominal pain EXAM: CT ABDOMEN AND PELVIS WITHOUT CONTRAST TECHNIQUE: Multidetector CT imaging of the abdomen and pelvis was performed following the standard protocol without IV contrast. COMPARISON:  CT 05/01/2019 FINDINGS: Lower chest: Atelectatic changes are present in the lung bases including more bandlike areas of subsegmental atelectasis. Some interlobular septal thickening is noted as well which could reflect early edema. Cardiomegaly appearance. Tip of a dual lumen dialysis catheter is noted. Post sternotomy changes. Coronary artery calcifications. Hepatobiliary: No visible liver lesion on this unenhanced CT. Liver attenuation is within normal limits. Scattered calcifications in the liver are favored to be vascular in nature. Gallbladder wall appears thickened with some pericholecystic fluid though may be redistributed. Layering calcified gallstones and probable sludge present within the lumen. Pancreas: No pancreatic ductal dilatation or surrounding inflammatory changes. Spleen: Normal in size. No concerning splenic lesions. Linear branching calcifications are present. Adrenals/Urinary Tract: Normal adrenal glands. Kidneys are not symmetric in size and normally located. Extensive vascular calcifications in the renal hila. Few subcentimeter hypoattenuating foci too small to fully characterize on CT imaging but statistically likely benign. No visible or contour deforming renal lesions. No urolithiasis or hydronephrosis is evident. Urinary bladder  is largely decompressed at the time of exam and therefore poorly evaluated by CT imaging. Circumferential bladder wall thickening is nonspecific given underdistention. Stomach/Bowel: Distal esophagus, stomach and duodenum are unremarkable. No conspicuous small bowel thickening or dilatation. Portion of small bowel protrudes into a small infraumbilical ventral hernia anteriorly albeit without convincing evidence of mechanical obstruction or vascular compromise at this time. There is some diffuse mild pancolonic mural thickening. Scattered colonic diverticula without focal inflammation to suggest diverticulitis. Vascular/Lymphatic: Extensive atherosclerotic calcification throughout the abdominal aorta and branch vessels. No aneurysm or ectasia. Limited evaluation the absence of contrast. No suspicious or enlarged lymph nodes in the included lymphatic chains. Reproductive: Uterus is surgically absent. No concerning adnexal lesions. Other: Small volume simple attenuation ascites. No free air. Diffuse body wall edema. Fat and bowel containing infraumbilical ventral hernia as above. No other bowel containing hernia. No retroperitoneal or body wall hematoma. Musculoskeletal: Remote fracture deformity of the left inferior pubic ramus. No acute osseous abnormality or suspicious osseous lesion. Musculature is unremarkable. IMPRESSION: 1. Cholelithiasis with gallbladder wall thickening and pericholecystic fluid though may be redistributed.  If there is clinical concern for acute cholecystitis, consider further evaluation with right upper quadrant ultrasound. 2. Mild diffuse pancolonic mural thickening, nonspecific, but can be seen with colitis or possibly related to the abdominal ascites. 3. Fat and small bowel containing infraumbilical ventral hernia anteriorly albeit without convincing evidence of mechanical obstruction or vascular compromise at this time. 4. Circumferential bladder wall thickening is nonspecific given  underdistention. Recommend correlation with urinalysis to exclude cystitis. 5. Small volume simple attenuation ascites. Diffuse body wall edema. Correlate for features of anasarca. 6. Cardiomegaly with features of edema and basilar atelectasis. 7. Aortic Atherosclerosis (ICD10-I70.0). Electronically Signed   By: Lovena Le M.D.   On: 04/02/2020 15:19   DG Chest 2 View  Result Date: 04/17/2020 CLINICAL DATA:  Pneumonia EXAM: CHEST - 2 VIEW COMPARISON:  04/10/2020 FINDINGS: CABG changes with cardiac enlargement. Mild pulmonary vascular congestion has progressed. Small left pleural effusion has progressed. Small right effusion unchanged. Left lower lobe atelectasis/infiltrate with mild progression. Improved aeration right lung base. Left jugular central venous catheter tip in the right atrium unchanged. No pneumothorax IMPRESSION: Cardiac enlargement. Progressive mild vascular congestion and small left effusion suggesting fluid overload. Left lower lobe atelectasis/infiltrate with progression. Improved aeration right lung base. Electronically Signed   By: Franchot Gallo M.D.   On: 04/17/2020 08:26   DG Chest 2 View  Result Date: 04/10/2020 CLINICAL DATA:  Altered mental status EXAM: CHEST - 2 VIEW COMPARISON:  April 02, 2020 FINDINGS: Central catheter tip is in the right atrium, slightly distal to the cavoatrial junction. No pneumothorax. There is a small area of ill-defined opacity in the lateral right base. Lungs elsewhere are clear. There is cardiomegaly with pulmonary vascular normal. Patient is status post coronary artery bypass grafting. There is aortic atherosclerosis. No bone lesions. IMPRESSION: Central catheter as described without pneumothorax. Small area of suspected pneumonia lateral right base. Lungs elsewhere clear. There is cardiomegaly with postoperative coronary artery bypass grafting change. Aortic Atherosclerosis (ICD10-I70.0). Electronically Signed   By: Lowella Grip III M.D.    On: 04/10/2020 12:19   CT Head Wo Contrast  Result Date: 04/10/2020 CLINICAL DATA:  Altered mental status EXAM: CT HEAD WITHOUT CONTRAST TECHNIQUE: Contiguous axial images were obtained from the base of the skull through the vertex without intravenous contrast. COMPARISON:  05/01/2019 FINDINGS: Brain: No evidence of acute infarction, hemorrhage, hydrocephalus, extra-axial collection or mass lesion/mass effect. Scattered low-density changes within the periventricular and subcortical white matter compatible with chronic microvascular ischemic change. Mild diffuse cerebral volume loss. Vascular: Atherosclerotic calcifications involving the large vessels of the skull base. No unexpected hyperdense vessel. Skull: Normal. Negative for fracture or focal lesion. Sinuses/Orbits: Chronic mucosal thickening within the left sphenoid sinus and posterior left ethmoid air cells. Remaining paranasal sinuses and mastoid air cells are clear. Other: None. IMPRESSION: 1. No acute intracranial findings. 2. Chronic microvascular ischemic change and cerebral volume loss. Electronically Signed   By: Davina Poke D.O.   On: 04/10/2020 12:29   CT CHEST WO CONTRAST  Result Date: 04/10/2020 CLINICAL DATA:  Altered mental status EXAM: CT CHEST WITHOUT CONTRAST TECHNIQUE: Multidetector CT imaging of the chest was performed following the standard protocol without IV contrast. COMPARISON:  Chest radiograph April 10, 2020; chest CT Sep 02, 2019 FINDINGS: Note that there is a degree of motion artifact. Cardiovascular: There is no thoracic aortic aneurysm. There are scattered foci of calcification in visualized great vessels. There is aortic atherosclerosis as well as multiple foci of coronary artery calcification.  There is cardiomegaly. No pericardial effusion or pericardial thickening. Central catheter tip is in the right atrium, slightly beyond the cavoatrial junction. Mediastinum/Nodes: Thyroid appears unremarkable. No  appreciable thoracic adenopathy. No esophageal lesions are evident. Lungs/Pleura: There is atelectatic change in the lung bases. There is suspected loculated effusion along the lateral aspect of the right major fissure. No frank airspace consolidation. No appreciable pleural effusions. No pneumothorax. Trachea and major bronchial structures appear patent, although there is a degree of motion artifact. Upper Abdomen: There is upper abdominal aortic as well as multiple mesenteric arterial vascular calcifications. Visualized upper abdominal structures otherwise appear unremarkable. Musculoskeletal: Status post median sternotomy. No blastic or lytic bone lesions evident. No evident chest wall lesions. IMPRESSION: 1. Study somewhat less than optimal due to a degree of motion artifact. 2. Areas of atelectatic change. Mild loculated effusion along the lateral aspect of the right major fissure. No edema or consolidation. 3.  No evident adenopathy. 4. Aortic atherosclerosis as well as foci of great vessel and coronary artery calcification. There is a degree of cardiomegaly. Central catheter tip in right atrium. Aortic Atherosclerosis (ICD10-I70.0). Electronically Signed   By: Lowella Grip III M.D.   On: 04/10/2020 14:59   MR BRAIN WO CONTRAST  Result Date: 04/10/2020 CLINICAL DATA:  Mental status change EXAM: MRI HEAD WITHOUT CONTRAST TECHNIQUE: Multiplanar, multiecho pulse sequences of the brain and surrounding structures were obtained without intravenous contrast. COMPARISON:  March 2020 FINDINGS: Motion artifact is present. Brain: There is no acute infarction or intracranial hemorrhage. There is no intracranial mass, mass effect, or edema. There is no hydrocephalus or extra-axial fluid collection. Prominence of the ventricles and sulci reflects generalized parenchymal volume loss. Patchy and confluent areas of T2 hyperintensity in the supratentorial white matter are nonspecific but probably reflect moderate  chronic microvascular ischemic changes. 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 Small chronic infarcts of the cerebellum. There are a few scattered small foci of susceptibility hypointensity in the cerebral white matter likely reflecting chronic microhemorrhages. Vascular: Major vessel flow voids at the skull base are preserved. Skull and upper cervical spine: Normal marrow signal is preserved. Sinuses/Orbits: Patchy paranasal sinus mucosal thickening, including chronic left sphenoid mucosal thickening. Bilateral lens replacements. Other: Sella is unremarkable.  Patchy mastoid fluid opacification. IMPRESSION: No acute infarction, hemorrhage, or mass. Chronic microvascular ischemic changes and few small chronic infarcts. No substantial change since 2020. Electronically Signed   By: Macy Mis M.D.   On: 04/10/2020 19:56   DG Chest Portable 1 View  Result Date: 04/02/2020 CLINICAL DATA:  Left upper quadrant pain beginning 2 days prior radiating to left chest and back, history of CHF, CAD, diabetes EXAM: PORTABLE CHEST 1 VIEW COMPARISON:  Radiograph 11/03/2019, CT 09/02/2019 FINDINGS: Left IJ approach dual lumen dialysis catheter tip projects over the right atrium. Pacer pads and telemetry leads overlie the chest. Postsurgical changes related to prior CABG including intact and aligned sternotomy wires and multiple surgical clips projecting over the mediastinum. Cardiomegaly is similar to prior portable radiography. The aorta is calcified. The remaining cardiomediastinal contours are unremarkable. Mixed hazy and patchy opacities are present throughout both lungs with indistinct, congested pulmonary vascularity, fissural and septal thickening, and small bilateral effusions. Biapical pleuroparenchymal scarring. No pneumothorax. No acute osseous or soft tissue abnormality. Degenerative changes are present in the imaged spine and shoulders. IMPRESSION: 1. Findings compatible with CHF/volume  overload with cardiomegaly and edema, effusions. 2. More coalescent airspace disease is likely to reflect alveolar  edema though infection could present similarly. Electronically Signed   By: Lovena Le M.D.   On: 04/02/2020 15:11   US Abdomen Limited RUQ (LIVER/GB)  Result Date: 04/04/2020 CLINICAL DATA:  69 year old female with possible cholecystitis. EXAM: ULTRASOUND ABDOMEN LIMITED RIGHT UPPER QUADRANT COMPARISON:  CT abdomen pelvis from 04/02/2020 and right upper quadrant ultrasounds from 05/01/2019 and 09/04/2019. FINDINGS: Gallbladder: The gallbladder is relatively decompressed with thickening of the gallbladder wall measuring up to 2.5 mm, significantly decreased from 09/04/2019 comparison. Layering sludge and a few small layering gallstones are again seen. Trace pericholecystic/perihepatic free fluid. Absent sonographic Murphy sign. Common bile duct: Diameter: 3.4 mm Liver: No focal lesion identified. Within normal limits in parenchymal echogenicity and echotexture. Portal vein is patent on color Doppler imaging with normal direction of blood flow towards the liver. Other: No perihepatic ascites. IMPRESSION: No convincing sonographic signs of acute cholecystitis. Chronic gallbladder wall thickening up to 2.5 mm, decreased from May 2021 comparison, most likely secondary to congestive heart failure although chronic cholecystitis is plausible. Similar appearing layering sludge and gallstones in a nondistended gallbladder. If clinical concern for acute cholecystitis persists, recommend HIDA scan for further evaluation. Ruthann Cancer, MD [facility] Radiology Electronically Signed   By: Ruthann Cancer MD   On: 04/04/2020 08:30       Subjective:  Patient seen and examined the bedside this morning.  Hemodynamically stable.  Comfortable ,denies any complaints  Discharge Exam: Vitals:   04/19/20 0100 04/19/20 0453  BP: (!) 154/68 (!) 154/60  Pulse:   61  Resp:  18  Temp:  98.3 F (36.8 C)  SpO2:  99%   Vitals:   04/18/20 2001 04/19/20 0013 04/19/20 0100 04/19/20 0453  BP:  (!) 180/68 (!) 154/68 (!) 154/60  Pulse:  (!) 59  61  Resp:  18  18  Temp:  98.9 F (37.2 C)  98.3 F (36.8 C)  TempSrc:    Oral  SpO2: 93% 96%  99%  Weight:      Height:        General: Pt is alert, awake, not in acute distress Cardiovascular: RRR, S1/S2 +, no rubs, no gallops, dialysis catheter on the left chest Respiratory: CTA bilaterally, no wheezing, no rhonchi Abdominal: Soft, NT, ND, bowel sounds + Extremities: no edema, 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     Microbiology: Recent Results (from the past 240 hour(s))  Resp Panel by RT-PCR (Flu A&B, Covid) Nasopharyngeal Swab     Status: None   Collection Time: 04/10/20 12:58 PM   Specimen: Nasopharyngeal Swab; Nasopharyngeal(NP) swabs in vial transport medium  Result Value Ref Range Status   SARS Coronavirus 2 by RT PCR NEGATIVE NEGATIVE Final    Comment: (NOTE) SARS-CoV-2 target nucleic acids are NOT DETECTED.  The SARS-CoV-2 RNA is generally detectable in upper respiratory specimens during the acute phase of infection. The lowest concentration of SARS-CoV-2 viral copies this assay can detect is 138 copies/mL. A negative result does not preclude SARS-Cov-2 infection and should not be used as the sole basis for treatment or other patient management decisions. A negative result may occur with  improper specimen collection/handling, submission of specimen other than nasopharyngeal swab, presence of viral mutation(s) within the areas targeted by this assay, and inadequate number of viral copies(<138 copies/mL). A negative result must be combined with clinical observations, patient history,  and epidemiological information. The expected  result is Negative.  Fact Sheet for Patients:  EntrepreneurPulse.com.au  Fact Sheet for Healthcare Providers:  IncredibleEmployment.be  This test is no t yet approved or cleared by the [place] FDA and  has been authorized for detection and/or diagnosis of SARS-CoV-2 by FDA under an Emergency Use Authorization (EUA). This EUA will remain  in effect (meaning this test can be used) for the duration of the COVID-19 declaration under Section 564(b)(1) of the Act, 21 U.S.C.section 360bbb-3(b)(1), unless the authorization is terminated  or revoked sooner.       Influenza A by PCR NEGATIVE NEGATIVE Final   Influenza B by PCR NEGATIVE NEGATIVE Final    Comment: (NOTE) The Xpert Xpress SARS-CoV-2/FLU/RSV plus assay is intended as an aid in the diagnosis of influenza from Nasopharyngeal swab specimens and should not be used as a sole basis for treatment. Nasal washings and aspirates are unacceptable for Xpert Xpress SARS-CoV-2/FLU/RSV testing.  Fact Sheet for Patients: EntrepreneurPulse.com.au  Fact Sheet for Healthcare Providers: IncredibleEmployment.be  This test is not yet approved or cleared by the [place] FDA and has been authorized for detection and/or diagnosis of SARS-CoV-2 by FDA under an Emergency Use Authorization (EUA). This EUA will remain in effect (meaning this test can be used) for the duration of the COVID-19 declaration under Section 564(b)(1) of the Act, 21 U.S.C. section 360bbb-3(b)(1), unless the authorization is terminated or revoked.  Performed at [facility], [address] [place], [place] [address]   CULTURE, BLOOD (ROUTINE X 2) w Reflex to ID Panel     Status: None   Collection Time: 04/11/20  6:42 PM   Specimen: BLOOD  Result Value Ref Range Status   Specimen Description BLOOD BLOOD LEFT HAND  Final   Special Requests   Final    BOTTLES DRAWN AEROBIC AND  ANAEROBIC Blood Culture results may not be optimal due to an inadequate volume of blood received in culture bottles   Culture   Final    NO GROWTH 5 DAYS Performed at [facility], [address] [place], [place] [address]    Report Status 04/16/2020 FINAL  Final  CULTURE, BLOOD (ROUTINE X 2) w Reflex to ID Panel     Status: None   Collection Time: 04/11/20  6:50 PM   Specimen: BLOOD  Result Value Ref Range Status   Specimen Description BLOOD BLOOD LEFT FOREARM  Final   Special Requests   Final    BOTTLES DRAWN AEROBIC AND ANAEROBIC Blood Culture adequate volume   Culture   Final    NO GROWTH 5 DAYS Performed at [facility], [address] [place], [place] [address]    Report Status 04/16/2020 FINAL  Final  SARS CORONAVIRUS 2 (TAT 6-24 HRS) Nasopharyngeal Nasopharyngeal Swab     Status: None   Collection Time: 04/18/20 10:25 AM   Specimen: Nasopharyngeal Swab  Result Value Ref Range Status   SARS Coronavirus 2 NEGATIVE NEGATIVE Final    Comment: (NOTE) SARS-CoV-2 target nucleic acids are NOT DETECTED.  The SARS-CoV-2 RNA is generally detectable in upper and lower respiratory specimens during the acute phase of infection. Negative results do not preclude SARS-CoV-2 infection, do not rule out co-infections with other pathogens, and should not be used as the sole basis for treatment or other patient management decisions. Negative results must be combined with clinical observations, patient history, and epidemiological information. The expected result is Negative.  Fact Sheet for Patients: SugarRoll.be  Fact Sheet for Healthcare Providers: https://www.woods-mathews.com/  This test is not yet approved or cleared by the [place] FDA and  has been authorized for detection and/or diagnosis of SARS-CoV-2 by FDA under an Emergency Use Authorization (EUA). This EUA will remain  in effect (meaning this test can be  used) for the duration of the COVID-19 declaration under Se ction 564(b)(1) of the Act, 21 U.S.C. section 360bbb-3(b)(1), unless the authorization is terminated or revoked sooner.  Performed at [facility], [place] [address], [place], [place] [address]      Labs: BNP (last 3 results) Recent Labs    09/02/19 1555  BNP >2,836.6*   Basic Metabolic Panel: Recent Labs  Lab 04/13/20 0405 04/14/20 0454 04/15/20 0414 04/17/20 0354 04/19/20 0353  NA 136 140 140 134* 137  K 4.1 4.4 4.0 4.7 4.1  CL 101 100 102 98 99  CO2 25 25 27 26 27   GLUCOSE 73 61* 78 122* 113*  BUN 37* 41* 17 33* 19  CREATININE 8.01* 9.10* 5.38* 8.49* 6.90*  CALCIUM 8.1* 8.5* 8.5* 8.2* 8.6*  MG 2.0 2.0  --  2.1 2.0  PHOS 4.1 4.2  --  3.8 3.2   Liver Function Tests: No results for input(s): AST, ALT, ALKPHOS, BILITOT, PROT, ALBUMIN in the last 168 hours. No results for input(s): LIPASE, AMYLASE in the last 168 hours. No results for input(s): AMMONIA in the last 168 hours. CBC: Recent Labs  Lab 04/13/20 0405 04/14/20 0454 04/15/20 0414 04/16/20 0439 04/17/20 0354 04/19/20 0353  WBC 4.4  --   --  4.5  --   --   HGB 11.5* 11.6* 11.0* 11.6* 11.4* 11.2*  HCT 38.0 37.9 36.0 37.6 37.1 36.4  MCV 96.9  --   --  97.9  --   --   PLT 95*  --   --  77*  --   --    Cardiac Enzymes: No results for input(s): CKTOTAL, CKMB, CKMBINDEX, TROPONINI in the last 168 hours. BNP: Invalid input(s): POCBNP CBG: Recent Labs  Lab 04/18/20 0813 04/18/20 1204 04/18/20 1600 04/18/20 2055 04/19/20 0804  GLUCAP 103* 116* 148* 172* 105*   D-Dimer No results for input(s): DDIMER in the last 72 hours. Hgb A1c No results for input(s): HGBA1C in the last 72 hours. Lipid Profile No results for input(s): CHOL, HDL, LDLCALC, TRIG, CHOLHDL, LDLDIRECT in the last 72 hours. Thyroid function studies No results for input(s): TSH, T4TOTAL, T3FREE, THYROIDAB in the last 72 hours.  Invalid input(s): FREET3 Anemia work up No  results for input(s): VITAMINB12, FOLATE, FERRITIN, TIBC, IRON, RETICCTPCT in the last 72 hours. Urinalysis    Component Value Date/Time   COLORURINE RED (A) 10/17/2016 1643   APPEARANCEUR TURBID (A) 10/17/2016 1643   LABSPEC 1.014 10/17/2016 1643   PHURINE  10/17/2016 1643    TEST NOT REPORTED DUE TO COLOR INTERFERENCE OF URINE PIGMENT   GLUCOSEU (A) 10/17/2016 1643    TEST NOT REPORTED DUE TO COLOR INTERFERENCE OF URINE PIGMENT   HGBUR (A) 10/17/2016 1643    TEST NOT REPORTED DUE TO COLOR INTERFERENCE OF URINE PIGMENT   BILIRUBINUR (A) 10/17/2016 1643    TEST NOT REPORTED DUE TO COLOR INTERFERENCE OF URINE PIGMENT   KETONESUR (A) 10/17/2016 1643    TEST NOT REPORTED DUE TO COLOR INTERFERENCE OF URINE PIGMENT   PROTEINUR (A) 10/17/2016 1643    TEST NOT REPORTED DUE TO COLOR INTERFERENCE OF URINE PIGMENT   NITRITE (A) 10/17/2016 1643  TEST NOT REPORTED DUE TO COLOR INTERFERENCE OF URINE PIGMENT   LEUKOCYTESUR (A) 10/17/2016 1643    TEST NOT REPORTED DUE TO COLOR INTERFERENCE OF URINE PIGMENT   Sepsis Labs Invalid input(s): PROCALCITONIN,  WBC,  LACTICIDVEN Microbiology Recent Results (from the past 240 hour(s))  Resp Panel by RT-PCR (Flu A&B, Covid) Nasopharyngeal Swab     Status: None   Collection Time: 04/10/20 12:58 PM   Specimen: Nasopharyngeal Swab; Nasopharyngeal(NP) swabs in vial transport medium  Result Value Ref Range Status   SARS Coronavirus 2 by RT PCR NEGATIVE NEGATIVE Final    Comment: (NOTE) SARS-CoV-2 target nucleic acids are NOT DETECTED.  The SARS-CoV-2 RNA is generally detectable in upper respiratory specimens during the acute phase of infection. The lowest concentration of SARS-CoV-2 viral copies this assay can detect is 138 copies/mL. A negative result does not preclude SARS-Cov-2 infection and should not be used as the sole basis for treatment or other patient management decisions. A negative result may occur with  improper specimen  collection/handling, submission of specimen other than nasopharyngeal swab, presence of viral mutation(s) within the areas targeted by this assay, and inadequate number of viral copies(<138 copies/mL). A negative result must be combined with clinical observations, patient history, and epidemiological information. The expected result is Negative.  Fact Sheet for Patients:  EntrepreneurPulse.com.au  Fact Sheet for Healthcare Providers:  IncredibleEmployment.be  This test is no t yet approved or cleared by the [place] FDA and  has been authorized for detection and/or diagnosis of SARS-CoV-2 by FDA under an Emergency Use Authorization (EUA). This EUA will remain  in effect (meaning this test can be used) for the duration of the COVID-19 declaration under Section 564(b)(1) of the Act, 21 U.S.C.section 360bbb-3(b)(1), unless the authorization is terminated  or revoked sooner.       Influenza A by PCR NEGATIVE NEGATIVE Final   Influenza B by PCR NEGATIVE NEGATIVE Final    Comment: (NOTE) The Xpert Xpress SARS-CoV-2/FLU/RSV plus assay is intended as an aid in the diagnosis of influenza from Nasopharyngeal swab specimens and should not be used as a sole basis for treatment. Nasal washings and aspirates are unacceptable for Xpert Xpress SARS-CoV-2/FLU/RSV testing.  Fact Sheet for Patients: EntrepreneurPulse.com.au  Fact Sheet for Healthcare Providers: IncredibleEmployment.be  This test is not yet approved or cleared by the [place] FDA and has been authorized for detection and/or diagnosis of SARS-CoV-2 by FDA under an Emergency Use Authorization (EUA). This EUA will remain in effect (meaning this test can be used) for the duration of the COVID-19 declaration under Section 564(b)(1) of the Act, 21 U.S.C. section 360bbb-3(b)(1), unless the authorization is terminated or revoked.  Performed at [facility], [address] [place], [place] [address]   CULTURE, BLOOD (ROUTINE X 2) w Reflex to ID Panel     Status: None   Collection Time: 04/11/20  6:42 PM   Specimen: BLOOD  Result Value Ref Range Status   Specimen Description BLOOD BLOOD LEFT HAND  Final   Special Requests   Final    BOTTLES DRAWN AEROBIC AND ANAEROBIC Blood Culture results may not be optimal due to an inadequate volume of blood received in culture bottles   Culture   Final    NO GROWTH 5 DAYS Performed at [facility], [address] [place], [place] [address]    Report Status 04/16/2020 FINAL  Final  CULTURE, BLOOD (ROUTINE X 2) w Reflex to ID Panel  Status: None   Collection Time: 04/11/20  6:50 PM   Specimen: BLOOD  Result Value Ref Range Status   Specimen Description BLOOD BLOOD LEFT FOREARM  Final   Special Requests   Final    BOTTLES DRAWN AEROBIC AND ANAEROBIC Blood Culture adequate volume   Culture   Final    NO GROWTH 5 DAYS Performed at [facility], [address], [place], [place] [address]    Report Status 04/16/2020 FINAL  Final  SARS CORONAVIRUS 2 (TAT 6-24 HRS) Nasopharyngeal Nasopharyngeal Swab     Status: None   Collection Time: 04/18/20 10:25 AM   Specimen: Nasopharyngeal Swab  Result Value Ref Range Status   SARS Coronavirus 2 NEGATIVE NEGATIVE Final    Comment: (NOTE) SARS-CoV-2 target nucleic acids are NOT DETECTED.  The SARS-CoV-2 RNA is generally detectable in upper and lower respiratory specimens during the acute phase of infection. Negative results do not preclude SARS-CoV-2 infection, do not rule out co-infections with other pathogens, and should not be used as the sole basis for treatment or other patient management decisions. Negative results must be combined with clinical observations, patient history, and epidemiological information. The expected result is Negative.  Fact Sheet for Patients: SugarRoll.be  Fact  Sheet for Healthcare Providers: https://www.woods-mathews.com/  This test is not yet approved or cleared by the [place] FDA and  has been authorized for detection and/or diagnosis of SARS-CoV-2 by FDA under an Emergency Use Authorization (EUA). This EUA will remain  in effect (meaning this test can be used) for the duration of the COVID-19 declaration under Se ction 564(b)(1) of the Act, 21 U.S.C. section 360bbb-3(b)(1), unless the authorization is terminated or revoked sooner.  Performed at [facility], [place] [address], [place], [place] [address]     Please note: You were cared for by a hospitalist during your hospital stay. Once you are discharged, your primary care physician will handle any further medical issues. Please note that NO REFILLS for any discharge medications will be authorized once you are discharged, as it is imperative that you return to your primary care physician (or establish a relationship with a primary care physician if you do not have one) for your post hospital discharge needs so that they can reassess your need for medications and monitor your lab values.    Time coordinating discharge: 40 minutes  SIGNED:   Shelly Coss, MD  [facility] 04/19/2020, 8:50 AM Pager [phone]  If 7PM-7AM, please contact night-coverage [email or web address] Password [email or web address]

## 2020-04-19 NOTE — TOC Transition Note (Signed)
Transition of Care [facility]) - CM/SW Discharge Note   Patient Details  Name: Stephanie Ellis MRN: [id number] Date of Birth: 1950-07-22  [facility] CM/SW Contact:  Candie Chroman, LCSW Phone Number: 04/19/2020, 10:03 AM   Clinical Narrative:  Patient has orders to discharge to [facility] today. RN will call report to [phone]. Per RN, plan to go to HD around 2:00 or 3:00 today. RN will call EMS transport once patient returns. SNF admissions coordinator and daughter, [name], is aware. CSW asked RN to call [name] once transport is being arranged. No further concerns. CSW signing off.    Final next level of care: Skilled Nursing Facility Barriers to Discharge: Barriers Resolved   Patient Goals and CMS Choice Patient states their goals for this hospitalization and ongoing recovery are:: Patient is unable to state goals.  Daughter does not want patient to go home alone CMS Medicare.gov Compare Post Acute Care list provided to:: Patient Represenative (must comment) Choice offered to / list presented to : [place]  Discharge Placement   Existing PASRR number confirmed : 04/12/20          Patient chooses bed at: [facility] [place] Patient to be transferred to facility by: EMS Name of family member notified: [name] Patient and family notified of of transfer: 04/19/20  Discharge Plan and Services   Discharge Planning Services: CM Consult                                 Social Determinants of Health ([place]) Interventions     Readmission Risk Interventions Readmission Risk Prevention Plan 04/11/2020 05/04/2019  Transportation Screening Complete Complete  PCP or Specialist Appt within 3-5 Days - Complete  Palliative Care Screening - Not Applicable  Medication Review ([place]) Complete Complete  PCP or Specialist appointment within 3-5 days of discharge Complete -  [place] or Home Care Consult Complete -  SW Recovery  Care/Counseling Consult Complete -  Palliative Care Screening Not Applicable -  Skilled Nursing Facility Complete -  Some recent data might be hidden

## 2020-04-19 NOTE — Progress Notes (Signed)
[facility]  ROUNDING NOTE   Subjective:   Stephanie Ellis was admitted to [facility] on 04/10/2020 for Slurred speech [R47.81] Altered mental status, unspecified altered mental status type [R41.82] AMS (altered mental status) [R41.82]  The patient is due for HD today. In good spirits at the moment.  Resting in bed.      Objective:  Vital signs in last 24 hours:  Temp:  [97.9 F (36.6 C)-99.5 F (37.5 C)] 99.5 F (37.5 C) (12/29 1100) Pulse Rate:  [59-65] 65 (12/29 1100) Resp:  [18] 18 (12/29 1100) BP: (101-180)/(52-68) 152/59 (12/29 1100) SpO2:  [89 %-99 %] 97 % (12/29 1100)  Weight change:  Filed Weights   04/10/20 1106  Weight: 50 kg    Intake/Output: I/O last 3 completed shifts: In: 210 [P.O.:210] Out: 500 [Other:500]   Intake/Output this shift:  No intake/output data recorded.  Physical Exam: General:  Resting in bed, in no acute distress  Head:  Oral mucous membranes moist  Eyes:  Sclerae and conjunctivae clear  Lungs:   Clear bilaterally, normal effort  Heart:  S1S2, no rubs or gallops  Abdomen:   Soft, nontender, nondistended  Extremities:  bilateral lower extremity amputations.  Neurologic:  Awake, alert     Access: LIJ permcath    Basic Metabolic Panel: Recent Labs  Lab 04/13/20 0405 04/14/20 0454 04/15/20 0414 04/17/20 0354 04/19/20 0353  NA 136 140 140 134* 137  K 4.1 4.4 4.0 4.7 4.1  CL 101 100 102 98 99  CO2 25 25 27 26 27   GLUCOSE 73 61* 78 122* 113*  BUN 37* 41* 17 33* 19  CREATININE 8.01* 9.10* 5.38* 8.49* 6.90*  CALCIUM 8.1* 8.5* 8.5* 8.2* 8.6*  MG 2.0 2.0  --  2.1 2.0  PHOS 4.1 4.2  --  3.8 3.2    Liver Function Tests: No results for input(s): AST, ALT, ALKPHOS, BILITOT, PROT, ALBUMIN in the last 168 hours. No results for input(s): LIPASE, AMYLASE in the last 168 hours. No results for input(s): AMMONIA in the last 168 hours.  CBC: Recent Labs  Lab 04/13/20 0405 04/14/20 0454 04/15/20 0414 04/16/20 0439  04/17/20 0354 04/19/20 0353  WBC 4.4  --   --  4.5  --   --   HGB 11.5* 11.6* 11.0* 11.6* 11.4* 11.2*  HCT 38.0 37.9 36.0 37.6 37.1 36.4  MCV 96.9  --   --  97.9  --   --   PLT 95*  --   --  77*  --   --     Cardiac Enzymes: No results for input(s): CKTOTAL, CKMB, CKMBINDEX, TROPONINI in the last 168 hours.  BNP: Invalid input(s): POCBNP  CBG: Recent Labs  Lab 04/18/20 1204 04/18/20 1600 04/18/20 2055 04/19/20 0804 04/19/20 1154  GLUCAP 116* 148* 172* 105* 141*    Microbiology: Results for orders placed or performed during the hospital encounter of 04/10/20  Resp Panel by RT-PCR (Flu A&B, Covid) Nasopharyngeal Swab     Status: None   Collection Time: 04/10/20 12:58 PM   Specimen: Nasopharyngeal Swab; Nasopharyngeal(NP) swabs in vial transport medium  Result Value Ref Range Status   SARS Coronavirus 2 by RT PCR NEGATIVE NEGATIVE Final    Comment: (NOTE) SARS-CoV-2 target nucleic acids are NOT DETECTED.  The SARS-CoV-2 RNA is generally detectable in upper respiratory specimens during the acute phase of infection. The lowest concentration of SARS-CoV-2 viral copies this assay can detect is 138 copies/mL. A negative result does not preclude  SARS-Cov-2 infection and should not be used as the sole basis for treatment or other patient management decisions. A negative result may occur with  improper specimen collection/handling, submission of specimen other than nasopharyngeal swab, presence of viral mutation(s) within the areas targeted by this assay, and inadequate number of viral copies(<138 copies/mL). A negative result must be combined with clinical observations, patient history, and epidemiological information. The expected result is Negative.  Fact Sheet for Patients:  EntrepreneurPulse.com.au  Fact Sheet for Healthcare Providers:  IncredibleEmployment.be  This test is no t yet approved or cleared by the [place] FDA and   has been authorized for detection and/or diagnosis of SARS-CoV-2 by FDA under an Emergency Use Authorization (EUA). This EUA will remain  in effect (meaning this test can be used) for the duration of the COVID-19 declaration under Section 564(b)(1) of the Act, 21 U.S.C.section 360bbb-3(b)(1), unless the authorization is terminated  or revoked sooner.       Influenza A by PCR NEGATIVE NEGATIVE Final   Influenza B by PCR NEGATIVE NEGATIVE Final    Comment: (NOTE) The Xpert Xpress SARS-CoV-2/FLU/RSV plus assay is intended as an aid in the diagnosis of influenza from Nasopharyngeal swab specimens and should not be used as a sole basis for treatment. Nasal washings and aspirates are unacceptable for Xpert Xpress SARS-CoV-2/FLU/RSV testing.  Fact Sheet for Patients: EntrepreneurPulse.com.au  Fact Sheet for Healthcare Providers: IncredibleEmployment.be  This test is not yet approved or cleared by the [place] FDA and has been authorized for detection and/or diagnosis of SARS-CoV-2 by FDA under an Emergency Use Authorization (EUA). This EUA will remain in effect (meaning this test can be used) for the duration of the COVID-19 declaration under Section 564(b)(1) of the Act, 21 U.S.C. section 360bbb-3(b)(1), unless the authorization is terminated or revoked.  Performed at [facility], [address] [place], [place] [address]   CULTURE, BLOOD (ROUTINE X 2) w Reflex to ID Panel     Status: None   Collection Time: 04/11/20  6:42 PM   Specimen: BLOOD  Result Value Ref Range Status   Specimen Description BLOOD BLOOD LEFT HAND  Final   Special Requests   Final    BOTTLES DRAWN AEROBIC AND ANAEROBIC Blood Culture results may not be optimal due to an inadequate volume of blood received in culture bottles   Culture   Final    NO GROWTH 5 DAYS Performed at [facility], [address] [place], [place] [address]    Report  Status 04/16/2020 FINAL  Final  CULTURE, BLOOD (ROUTINE X 2) w Reflex to ID Panel     Status: None   Collection Time: 04/11/20  6:50 PM   Specimen: BLOOD  Result Value Ref Range Status   Specimen Description BLOOD BLOOD LEFT FOREARM  Final   Special Requests   Final    BOTTLES DRAWN AEROBIC AND ANAEROBIC Blood Culture adequate volume   Culture   Final    NO GROWTH 5 DAYS Performed at [facility], [address] [place], [place] [address]    Report Status 04/16/2020 FINAL  Final  SARS CORONAVIRUS 2 (TAT 6-24 HRS) Nasopharyngeal Nasopharyngeal Swab     Status: None   Collection Time: 04/18/20 10:25 AM   Specimen: Nasopharyngeal Swab  Result Value Ref Range Status   SARS Coronavirus 2 NEGATIVE NEGATIVE Final    Comment: (NOTE) SARS-CoV-2 target nucleic acids are NOT DETECTED.  The SARS-CoV-2 RNA is generally detectable in upper and lower  respiratory specimens during the acute phase of infection. Negative results do not preclude SARS-CoV-2 infection, do not rule out co-infections with other pathogens, and should not be used as the sole basis for treatment or other patient management decisions. Negative results must be combined with clinical observations, patient history, and epidemiological information. The expected result is Negative.  Fact Sheet for Patients: SugarRoll.be  Fact Sheet for Healthcare Providers: https://www.woods-mathews.com/  This test is not yet approved or cleared by the [place] FDA and  has been authorized for detection and/or diagnosis of SARS-CoV-2 by FDA under an Emergency Use Authorization (EUA). This EUA will remain  in effect (meaning this test can be used) for the duration of the COVID-19 declaration under Se ction 564(b)(1) of the Act, 21 U.S.C. section 360bbb-3(b)(1), unless the authorization is terminated or revoked sooner.  Performed at [facility], [place] [address], [place],  [place] [address]     Coagulation Studies: No results for input(s): LABPROT, INR in the last 72 hours.  Urinalysis: No results for input(s): COLORURINE, LABSPEC, PHURINE, GLUCOSEU, HGBUR, BILIRUBINUR, KETONESUR, PROTEINUR, UROBILINOGEN, NITRITE, LEUKOCYTESUR in the last 72 hours.  Invalid input(s): APPERANCEUR    Imaging: No results found.   Medications:    Scheduled Meds: . apixaban  2.5 mg Oral BID  . aspirin EC  81 mg Oral Daily  . atorvastatin  80 mg Oral QHS  . brimonidine  1 drop Both Eyes TID  . carvedilol  37.5 mg Oral BID WC  . Chlorhexidine Gluconate Cloth  6 each Topical Q0600  . fluticasone  2 spray Each Nare Daily  . hydrALAZINE  75 mg Oral Q8H  . insulin aspart  0-6 Units Subcutaneous TID WC  . ipratropium  2 spray Each Nare BID  . isosorbide mononitrate  60 mg Oral Daily  . levETIRAcetam  250 mg Oral Q M,W,F-HD  . levETIRAcetam  500 mg Oral Q12H  . lidocaine  15 mL Oral Once  . losartan  50 mg Oral Daily  . mirtazapine  7.5 mg Oral QHS  . omega-3 acid ethyl esters  2 g Oral Daily  . pantoprazole  40 mg Oral Daily  . rOPINIRole  0.25 mg Oral QHS  . senna-docusate  1 tablet Oral BID  . sevelamer carbonate  1,600 mg Oral TID WC  . sodium chloride flush  3 mL Intravenous Q12H  . sodium chloride flush  3 mL Intravenous Q12H  . [START ON 04/20/2020] thiamine injection  100 mg Intravenous Daily  . timolol  1 drop Both Eyes TID  . triamcinolone   Topical BID  . valACYclovir  500 mg Oral Daily  . vitamin B-12  1,000 mcg Oral Daily   Continuous Infusions: . sodium chloride    . levETIRAcetam     PRN Meds:.sodium chloride, hydrALAZINE, meclizine, ondansetron **OR** ondansetron (ZOFRAN) IV, polyvinyl alcohol, sodium chloride flush    Assessment/ Plan:  Stephanie Ellis is a 69 y.o. black female with end stage renal disease on hemodialysis, peripheral vascular disease status post bilateral BKA, diabetes mellitus type II, coronary artery disease status post CABG  admitted to [facility] on 04/10/2020 for Slurred speech [R47.81] Altered mental status, unspecified altered mental status type [R41.82] AMS (altered mental status) [R41.82]  [facility] Nephrology MWF [address] LIJ permcath 52kg  #  End Stage Renal Disease The patient is due for hemodialysis treatment today.  Orders have been prepared.  Thereafter she will resume her normal outpatient schedule.   # Anemia  of chronic kidney disease Lab Results  Component Value Date   HGB 11.2 (L) 04/19/2020   Continue to monitor hemoglobin as an outpatient.  #Secondary Hyperparathyroidism:  Lab Results  Component Value Date   PTH 385 (H) 09/03/2019   CALCIUM 8.6 (L) 04/19/2020   PHOS 3.2 04/19/2020  Phosphorus currently at target at 3.2.  Recommend continued monitoring abdominal metabolism parameters as an outpatient.  #Shingles   Patient is on valacyclovir lesions on the left chest sidewall.  #Altered mental status EEG-no seizures seen Likely acute metabolic encephalopathy-improving Had an episode of altered mental status during dialysis on Thursday, ?  Due to low blood pressure Patient became responsive as soon as blood was returned.    LOS: 9 Stephanie Ellis 12/29/20211:35 PM

## 2020-04-19 NOTE — TOC Progression Note (Addendum)
Transition of Care [facility]) - Progression Note    Patient Details  Name: Stephanie Ellis MRN: [id number] Date of Birth: [birth year]  [facility]) CM/SW [place], LCSW Phone Number: 04/19/2020, 1:07 PM  Clinical Narrative:   [facility] COVID outbreak has spread so admissions are on hold. Patient has one other bed offer from [facility]. Left voicemail for admissions coordinator to see if they can take her today. Daughter [name] has been updated.  12/29: Updated patient. No call back from [facility] yet. Sent discharge paperwork on hub.  3:41 pm: Admissions coordinator is out on medical leave. Left voicemail for person covering her. She is out this afternoon but will return in the morning. Left voicemail for daughter to provide update.  Expected Discharge Plan: Skilled Nursing Facility Barriers to Discharge: Barriers Resolved  Expected Discharge Plan and Services Expected Discharge Plan: [place]   Discharge Planning Services: CM Consult     Expected Discharge Date: 04/19/20                                     Social Determinants of Health (SDOH) Interventions    Readmission Risk Interventions Readmission Risk Prevention Plan 04/11/2020 05/04/2019  Transportation Screening Complete Complete  PCP or Specialist Appt within 3-5 Days - Complete  Palliative Care Screening - Not Applicable  Medication Review ([place]) Complete Complete  PCP or Specialist appointment within 3-5 days of discharge Complete -  [place] or Home Care Consult Complete -  SW Recovery Care/Counseling Consult Complete -  Palliative Care Screening Not Applicable -  Skilled Nursing Facility Complete -  Some recent data might be hidden

## 2020-04-19 NOTE — Progress Notes (Signed)
Hemodialysis patient known at [facility] ([place]) MWF 11:20. No holiday change for this patient, next chair time is Friday 12/31. Please contact me with any dialysis placement concerns.  Elvera Bicker Dialysis Coordinator [phone]

## 2020-04-20 LAB — GLUCOSE, CAPILLARY
Glucose-Capillary: 119 mg/dL — ABNORMAL HIGH (ref 70–99)
Glucose-Capillary: 133 mg/dL — ABNORMAL HIGH (ref 70–99)

## 2020-04-20 MED ORDER — K PHOS MONO-SOD PHOS DI & MONO 155-852-130 MG PO TABS
500.0000 mg | ORAL_TABLET | Freq: Once | ORAL | Status: AC
  Administered 2020-04-20: 500 mg via ORAL
  Filled 2020-04-20: qty 2

## 2020-04-20 NOTE — Progress Notes (Signed)
Physical Therapy Treatment Patient Details Name: Stephanie Ellis MRN: [id number] DOB: 1950/12/30 Today's Date: 04/20/2020    History of Present Illness Stephanie Ellis is a 37yoF who comes to [facility] on 12/20 c AMS. PMH: DM, ESRD on HD MWF, HTN, PAD s/p bilateral BKA, CAD, CAD s/p CABG, left eye blindnss, heavy right eye visual impairment.    PT Comments    Pt was long sitting in bed finishing breakfast upon arrival. She agrees to PT session and is motivated/cooperative throughout. Required min assist to exit L side of bed. Total assist for proper placement of BLE prosthetics. She required min assist to stand to RW and was able to ambulate 50 ft without difficulty. Overall pt is progressing well. Recommend DC to SNF to maximize independence with ADL. Pt was in recliner with chair alarm set and cal;l bell in reach at conclusion of session.     Follow Up Recommendations  SNF     Equipment Recommendations  None recommended by PT    Recommendations for Other Services       Precautions / Restrictions Precautions Precautions: Fall Restrictions Weight Bearing Restrictions: No    Mobility  Bed Mobility Overal bed mobility: Needs Assistance Bed Mobility: Supine to Sit     Supine to sit: Min assist     General bed mobility comments: Min assist required to safely exit L side of bed with increased time to perform  Transfers Overall transfer level: Needs assistance Equipment used: Rolling walker (2 wheeled) Transfers: Sit to/from Stand Sit to Stand: Min assist         General transfer comment: Min assist form lowest bed height  Ambulation/Gait Ambulation/Gait assistance: Min assist Gait Distance (Feet): 50 Feet Assistive device: Rolling walker (2 wheeled) Gait Pattern/deviations: Step-through pattern;Trunk flexed Gait velocity: decreased   General Gait Details: Pt was easily able ot ambulate 50 ft with slow gait pattern. requires BUE support for safety throughout however  overall tolerated without issues/fatigue       Balance Overall balance assessment: Needs assistance Sitting-balance support: Feet supported;No upper extremity supported Sitting balance-Leahy Scale: Good Sitting balance - Comments: no LOB sitting EOB x ~8 minutes   Standing balance support: During functional activity;Bilateral upper extremity supported Standing balance-Leahy Scale: Fair Standing balance comment: No LOB or unsteadiness with BUe support       Cognition Arousal/Alertness: Awake/alert Behavior During Therapy: WFL for tasks assessed/performed Overall Cognitive Status: No family/caregiver present to determine baseline cognitive functioning    General Comments: Pt is A and O x 3. follows commands consistently throughout.             Pertinent Vitals/Pain Pain Assessment: No/denies pain           PT Goals (current goals can now be found in the care plan section) Acute Rehab PT Goals Patient Stated Goal: go home eventually Progress towards PT goals: Progressing toward goals    Frequency    Min 2X/week      PT Plan Current plan remains appropriate       AM-PAC PT "6 Clicks" Mobility   Outcome Measure  Help needed turning from your back to your side while in a flat bed without using bedrails?: A Little Help needed moving from lying on your back to sitting on the side of a flat bed without using bedrails?: A Little Help needed moving to and from a bed to a chair (including a wheelchair)?: A Little Help needed standing up from a chair using your arms (e.g.,  wheelchair or bedside chair)?: A Little Help needed to walk in hospital room?: A Little Help needed climbing 3-5 steps with a railing? : A Lot 6 Click Score: 17    End of Session Equipment Utilized During Treatment: Gait belt Activity Tolerance: Patient tolerated treatment well Patient left: with call bell/phone within reach;in chair;with chair alarm set Nurse Communication: Mobility status PT  Visit Diagnosis: Difficulty in walking, not elsewhere classified (R26.2);Other abnormalities of gait and mobility (R26.89);Muscle weakness (generalized) (M62.81);Other symptoms and signs involving the nervous system (R29.898)     Time: 8826-6664 PT Time Calculation (min) (ACUTE ONLY): 39 min  Charges:  $Gait Training: 23-37 mins $Therapeutic Activity: 8-22 mins                     Julaine Fusi PTA 04/20/20, 9:31 AM

## 2020-04-20 NOTE — Progress Notes (Signed)
Called report to ashley at [employer]

## 2020-04-20 NOTE — Progress Notes (Signed)
Patient seen and examined at the bedside this mrng.  Hemodynamically stable.  No new complaints.  No change in the medical management.  Discharge summary and orders are already in place.

## 2020-04-20 NOTE — TOC Transition Note (Signed)
Transition of Care [facility]) - CM/SW Discharge Note   Patient Details  Name: Stephanie Ellis MRN: [id number] Date of Birth: October 16, 1950  [facility] CM/SW Contact:  Shelbie Hutching, RN Phone Number: 04/20/2020, 10:35 AM   Clinical Narrative:    Patient is medically cleared for discharge to [facility].  [facility] is not able to accept patients at this time.  Bedside RN will call report to [phone] and ask for [facility].  Once DC order has been placed RNCM will arrange for transport with [facility].      Final next level of care: Skilled Nursing Facility Barriers to Discharge: Barriers Resolved   Patient Goals and CMS Choice Patient states their goals for this hospitalization and ongoing recovery are:: Patient is unable to state goals.  Daughter does not want patient to go home alone CMS Medicare.gov Compare Post Acute Care list provided to:: Patient Represenative (must comment) Choice offered to / list presented to : Adult Children  Discharge Placement   Existing PASRR number confirmed : 04/12/20          Patient chooses bed at: [facility] Patient to be transferred to facility by: [facility] Name of family member notified: [name] Patient and family notified of of transfer: 04/20/20  Discharge Plan and Services   Discharge Planning Services: CM Consult                                 Social Determinants of Health (SDOH) Interventions     Readmission Risk Interventions Readmission Risk Prevention Plan 04/11/2020 05/04/2019  Transportation Screening Complete Complete  PCP or Specialist Appt within 3-5 Days - Complete  Palliative Care Screening - Not Applicable  Medication Review ([place]) Complete Complete  PCP or Specialist appointment within 3-5 days of discharge Complete -  [place] or Home Care Consult Complete -  SW Recovery Care/Counseling Consult Complete -  Palliative Care Screening Not Applicable -   [place] Complete -  Some recent data might be hidden

## 2020-04-20 NOTE — Care Management Important Message (Signed)
Important Message  Patient Details  Name: Stephanie Ellis MRN: [id number] Date of Birth: 03-Nov-1950   Medicare Important Message Given:  Yes     Juliann Pulse A Naviah Belfield 04/20/2020, 11:15 AM

## 2020-04-20 NOTE — TOC Progression Note (Signed)
Transition of Care [facility]) - Progression Note    Patient Details  Name: Stephanie Ellis MRN: [id number] Date of Birth: [birth year]  Transition [facility] Care [facility]) CM/SW Contact  Shelbie Hutching, RN Phone Number: 04/20/2020, 8:32 AM  Clinical Narrative:    Message left with Tiffany in Admissions at [facility] for return call to see if they can accept patient today.   Expected Discharge Plan: Skilled Nursing Facility Barriers to Discharge: Barriers Resolved  Expected Discharge Plan and Services Expected Discharge Plan: [place]   Discharge Planning Services: CM Consult     Expected Discharge Date: 04/19/20                                     Social Determinants of Health (SDOH) Interventions    Readmission Risk Interventions Readmission Risk Prevention Plan 04/11/2020 05/04/2019  Transportation Screening Complete Complete  PCP or Specialist Appt within 3-5 Days - Complete  Palliative Care Screening - Not Applicable  Medication Review ([place]) Complete Complete  PCP or Specialist appointment within 3-5 days of discharge Complete -  [place] or Home Care Consult Complete -  SW Recovery Care/Counseling Consult Complete -  Palliative Care Screening Not Applicable -  Skilled Nursing Facility Complete -  Some recent data might be hidden

## 2020-05-10 ENCOUNTER — Emergency Department: Payer: Medicare Other

## 2020-05-10 ENCOUNTER — Other Ambulatory Visit: Payer: Self-pay

## 2020-05-10 ENCOUNTER — Emergency Department
Admission: EM | Admit: 2020-05-10 | Discharge: 2020-05-10 | Disposition: A | Discharge status: Home / Self Care | Payer: Medicare Other | Source: Home / Self Care | Attending: Emergency Medicine | Admitting: Emergency Medicine

## 2020-05-10 DIAGNOSIS — Z8616 Personal history of COVID-19: Secondary | ICD-10-CM | POA: Insufficient documentation

## 2020-05-10 DIAGNOSIS — Z20822 Contact with and (suspected) exposure to covid-19: Secondary | ICD-10-CM | POA: Insufficient documentation

## 2020-05-10 DIAGNOSIS — I132 Hypertensive heart and chronic kidney disease with heart failure and with stage 5 chronic kidney disease, or end stage renal disease: Secondary | ICD-10-CM | POA: Insufficient documentation

## 2020-05-10 DIAGNOSIS — J90 Pleural effusion, not elsewhere classified: Secondary | ICD-10-CM | POA: Insufficient documentation

## 2020-05-10 DIAGNOSIS — Z79899 Other long term (current) drug therapy: Secondary | ICD-10-CM | POA: Insufficient documentation

## 2020-05-10 DIAGNOSIS — Z7901 Long term (current) use of anticoagulants: Secondary | ICD-10-CM | POA: Insufficient documentation

## 2020-05-10 DIAGNOSIS — I25810 Atherosclerosis of coronary artery bypass graft(s) without angina pectoris: Secondary | ICD-10-CM | POA: Insufficient documentation

## 2020-05-10 DIAGNOSIS — I5043 Acute on chronic combined systolic (congestive) and diastolic (congestive) heart failure: Secondary | ICD-10-CM | POA: Insufficient documentation

## 2020-05-10 DIAGNOSIS — Z794 Long term (current) use of insulin: Secondary | ICD-10-CM | POA: Insufficient documentation

## 2020-05-10 DIAGNOSIS — Z992 Dependence on renal dialysis: Secondary | ICD-10-CM | POA: Insufficient documentation

## 2020-05-10 DIAGNOSIS — J810 Acute pulmonary edema: Secondary | ICD-10-CM | POA: Insufficient documentation

## 2020-05-10 DIAGNOSIS — N186 End stage renal disease: Secondary | ICD-10-CM | POA: Insufficient documentation

## 2020-05-10 DIAGNOSIS — I2510 Atherosclerotic heart disease of native coronary artery without angina pectoris: Secondary | ICD-10-CM | POA: Insufficient documentation

## 2020-05-10 DIAGNOSIS — E1122 Type 2 diabetes mellitus with diabetic chronic kidney disease: Secondary | ICD-10-CM | POA: Insufficient documentation

## 2020-05-10 DIAGNOSIS — Z7982 Long term (current) use of aspirin: Secondary | ICD-10-CM | POA: Insufficient documentation

## 2020-05-10 DIAGNOSIS — E8779 Other fluid overload: Secondary | ICD-10-CM | POA: Insufficient documentation

## 2020-05-10 DIAGNOSIS — R0602 Shortness of breath: Secondary | ICD-10-CM

## 2020-05-10 LAB — BRAIN NATRIURETIC PEPTIDE: B Natriuretic Peptide: 3823.4 pg/mL — ABNORMAL HIGH (ref 0.0–100.0)

## 2020-05-10 LAB — CBC WITH DIFFERENTIAL/PLATELET
Abs Immature Granulocytes: 0.01 10*3/uL (ref 0.00–0.07)
Basophils Absolute: 0 10*3/uL (ref 0.0–0.1)
Basophils Relative: 0 %
Eosinophils Absolute: 0.1 10*3/uL (ref 0.0–0.5)
Eosinophils Relative: 2 %
HCT: 31.5 % — ABNORMAL LOW (ref 36.0–46.0)
Hemoglobin: 9.5 g/dL — ABNORMAL LOW (ref 12.0–15.0)
Immature Granulocytes: 0 %
Lymphocytes Relative: 14 %
Lymphs Abs: 0.6 10*3/uL — ABNORMAL LOW (ref 0.7–4.0)
MCH: 30.4 pg (ref 26.0–34.0)
MCHC: 30.2 g/dL (ref 30.0–36.0)
MCV: 100.6 fL — ABNORMAL HIGH (ref 80.0–100.0)
Monocytes Absolute: 0.5 10*3/uL (ref 0.1–1.0)
Monocytes Relative: 10 %
Neutro Abs: 3.3 10*3/uL (ref 1.7–7.7)
Neutrophils Relative %: 74 %
Platelets: 84 10*3/uL — ABNORMAL LOW (ref 150–400)
RBC: 3.13 MIL/uL — ABNORMAL LOW (ref 3.87–5.11)
RDW: 18.6 % — ABNORMAL HIGH (ref 11.5–15.5)
WBC: 4.5 10*3/uL (ref 4.0–10.5)
nRBC: 0 % (ref 0.0–0.2)

## 2020-05-10 LAB — COMPREHENSIVE METABOLIC PANEL
ALT: 27 U/L (ref 0–44)
AST: 21 U/L (ref 15–41)
Albumin: 3.5 g/dL (ref 3.5–5.0)
Alkaline Phosphatase: 88 U/L (ref 38–126)
Anion gap: 17 — ABNORMAL HIGH (ref 5–15)
BUN: 88 mg/dL — ABNORMAL HIGH (ref 8–23)
CO2: 24 mmol/L (ref 22–32)
Calcium: 8.7 mg/dL — ABNORMAL LOW (ref 8.9–10.3)
Chloride: 98 mmol/L (ref 98–111)
Creatinine, Ser: 10.38 mg/dL — ABNORMAL HIGH (ref 0.44–1.00)
GFR, Estimated: 4 mL/min — ABNORMAL LOW (ref >60)
Glucose, Bld: 51 mg/dL — ABNORMAL LOW (ref 70–99)
Potassium: 6.5 mmol/L (ref 3.5–5.1)
Sodium: 139 mmol/L (ref 135–145)
Total Bilirubin: 0.8 mg/dL (ref 0.3–1.2)
Total Protein: 6.9 g/dL (ref 6.5–8.1)

## 2020-05-10 LAB — RESP PANEL BY RT-PCR (FLU A&B, COVID) ARPGX2
Influenza A by PCR: NEGATIVE
Influenza B by PCR: NEGATIVE
SARS Coronavirus 2 by RT PCR: NEGATIVE

## 2020-05-10 LAB — TROPONIN I (HIGH SENSITIVITY)
Troponin I (High Sensitivity): 80 ng/L — ABNORMAL HIGH (ref <18)
Troponin I (High Sensitivity): 68 ng/L — ABNORMAL HIGH (ref <18)

## 2020-05-10 LAB — PROTIME-INR
INR: 1.7 — ABNORMAL HIGH (ref 0.8–1.2)
Prothrombin Time: 19.1 seconds — ABNORMAL HIGH (ref 11.4–15.2)

## 2020-05-10 NOTE — ED Notes (Signed)
Patient unable to sign but verbalizes understanding of discharge.

## 2020-05-10 NOTE — ED Provider Notes (Addendum)
[facility] Emergency Department Provider Note   ____________________________________________   Event Date/Time   First MD Initiated Contact with Patient 05/10/20 [phone]     (approximate)  I have reviewed the triage vital signs and the nursing notes.   HISTORY  Chief Complaint Shortness of Breath    HPI Stephanie Ellis is a 70 y.o. female with a past medical history of end-stage renal disease on dialysis Monday/Wednesday/Friday, heart failure, and bilateral BKA who presents for shortness of breath from her long-term care facility via EMS.  Patient states that this shortness of breath has been persistent over the past 24 hours.  Patient denies any known exacerbating or relieving factors.  Patient on 4 L nasal cannula.  Patient oriented to person and place but disoriented to time.  Patient currently denies any vision changes, tinnitus, difficulty speaking, facial droop, sore throat, chest pain, abdominal pain, nausea/vomiting/diarrhea, dysuria, or weakness/numbness/paresthesias in any extremity         Past Medical History:  Diagnosis Date  . (HFpEF) heart failure with preserved ejection fraction ([employer])    a. 01/2018 Echo: EF 50-55%, no rwma, Gr1 DD, triv AI, mild MR. Midly dil LA/RV, mod reduced RV fxn. Irreg thickening of TV w/ mobile echodensity that appears to arise from valve.  . Anemia   . Anorexia   . Bacteremia due to Pseudomonas   . Carotid arterial disease ([place])    a. 01/2017 Carotid U/S: RICA <54, LICA <98.  Marland Kitchen CHF (congestive heart failure) ([place])   . Complication of anesthesia    a. Pt reports h/o complication on 9 different occasions - ? hypotension/arrest.  . 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 01/2015 Cath [facility]): 70% LM, p-mLAD 50-60% (Resting FFR 0.75), mRCA 80-90%, ~40 Ost OM & D1-->CABG; b. 01/2016 Staged PCI of LCX x 2 and RCA.  Marland Kitchen ESRD (end stage renal disease) on dialysis ([place])    a. ESRD secondary to  acute kidney failure s/p CABG-->PD  . Essential hypertension   . GERD (gastroesophageal reflux disease)   . Heart murmur   . Hypercholesterolemia   . Myocardial infarction ([place]) 2016  . PVD (peripheral vascular disease) ([place])    a. 01/30/2018 PV Angio: Sev Left Popliteal dzs s/p PTA w/ 73m lutonix DEB w/ mech aspiration of the L popliteal, L AT, and Left tibioperoneal trunck and peroneal artery.  . 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  [facility]: Dr. BMarland KitchenHaithcock: CABG x 3, LIMA to LAD, SVG to RCA, SVG to OM3, EVH  . Sinusitis 2019  . Type II diabetes mellitus with complication ([facility]    CAD    Patient Active Problem List   Diagnosis Date Noted  . AMS (altered mental status) 04/10/2020  . Chronic combined systolic and diastolic CHF (congestive heart failure) ([place] 04/02/2020  . Thrombocytopenia ([place] 04/02/2020  . Chronic respiratory failure with hypoxia ([place] 11/03/2019  . Nocturnal hypoxia   . Shortness of breath   . Abdominal swelling   . Acute on chronic systolic CHF (congestive heart failure) ([place]   . Acute respiratory failure with hypoxia ([place] 09/02/2019  . Acute on chronic combined systolic (congestive) and diastolic (congestive) heart failure ([place] 09/02/2019  . 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 Severe anemia 05/01/2019  . Pneumonia due to COVID-19 virus 05/01/2019  . Resides in skilled nursing facility 12/29/2018  . CVA (cerebral vascular accident) ([place] 07/08/2018  . Infection of amputation stump ([place] 05/02/2018  . BKA  stump complication ([place]) 17/49/4496  . Unilateral complete BKA, right, initial encounter ([place]) 03/30/2018  . Transient loss of consciousness 03/19/2018  . Transient alteration of awareness   . VT (ventricular tachycardia) ([place])   . Disorientation   . Hypertensive urgency 03/08/2018  . Status post below-knee amputation of both lower extremities ([place]) 03/02/2018  . Ischemia of extremity 02/18/2018  . CKD (chronic kidney disease) stage 5, GFR less than 15  ml/min (HCC) 02/16/2018  . Diabetic osteomyelitis ([place])   . Peripheral vascular disease ([place])   . Advanced care planning/counseling discussion   . Palliative care by specialist   . Goals of care, counseling/discussion   . Type 2 diabetes mellitus with hyperlipidemia ([place]) 01/30/2018  . Sepsis ([place]) 01/29/2018  . Peripheral neuropathy 06/17/2017  . Seizure disorder ([place]) 02/06/2017  . Accelerated hypertension 02/06/2017  . Endophthalmitis, left eye 12/05/2016  . Vomiting 10/24/2016  . Hematuria 10/17/2016  . Acute lower UTI 10/17/2016  . Anesthesia complication 75/91/6384  . Chest pain 09/04/2016  . Steal syndrome dialysis vascular access, initial encounter ([place]) 06/18/2016  . 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 Coronary artery disease involving coronary bypass graft of native heart with angina pectoris ([place]) 02/10/2016  . Hypertension 02/02/2016  . Hyperlipidemia 02/02/2016  . Coronary artery disease involving left main coronary artery 10/04/2015  . Abdominal pain of unknown etiology 10/04/2015  . ESRD (end stage renal disease) on dialysis ([place])   . Type II diabetes mellitus with complication ([place])   . NSTEMI (non-ST elevated myocardial infarction) ([place])   . Right sided abdominal pain   . Elevated troponin 10/03/2015  . 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 Acute on chronic combined systolic and diastolic CHF (congestive heart failure) ([place]) 04/19/2014  . Anemia 09/20/2013  . Routine health maintenance 01/29/2013    Past Surgical History:  Procedure Laterality Date  . ABDOMINAL HYSTERECTOMY    . AMPUTATION Right 02/25/2018   Procedure: AMPUTATION BELOW KNEE;  Surgeon: Katha Cabal, MD;  Location: [facility] ORS;  Service: Vascular;  Laterality: Right;  . AMPUTATION Left 03/11/2018   Procedure: AMPUTATION BELOW KNEE;  Surgeon: Algernon Huxley, MD;  Location: [facility];  Service: Vascular;  Laterality: Left;  . AMPUTATION Bilateral 04/13/2018   Procedure: REVISION OF  BILATERAL AMPUTATIONS BELOW KNEE WITH WOUND VAC APPLICATIONS;  Surgeon: Evaristo Bury, MD;  Location: [facility];  Service: Vascular;  Laterality: Bilateral;  . AMPUTATION TOE Left 02/06/2018   Procedure: AMPUTATION GREAT TOE;  Surgeon: Samara Deist, DPM;  Location: [facility];  Service: Podiatry;  Laterality: Left;  . APPLICATION OF WOUND VAC Left 04/13/2018   Procedure: APPLICATION OF WOUND VAC;  Surgeon: Evaristo Bury, MD;  Location: [facility];  Service: Vascular;  Laterality: Left;  . ARTERIOVENOUS GRAFT PLACEMENT  05/2016  . AV FISTULA PLACEMENT Left 05/30/2016   Procedure: ARTERIOVENOUS graft;  Surgeon: Algernon Huxley, MD;  Location: [facility];  Service: Vascular;  Laterality: Left;  . CAPD INSERTION N/A 10/30/2017   Procedure: LAPAROSCOPIC INSERTION CONTINUOUS AMBULATORY PERITONEAL DIALYSIS  (CAPD) CATHETER;  Surgeon: Algernon Huxley, MD;  Location: [facility];  Service: Vascular;  Laterality: N/A;  . CARDIAC CATHETERIZATION  01/2015   [facility]: Ost LM 70%, p-m LAD 50-60% (Rest FFR + @ 0.75), mRCA 80-90%, ostD1 40%, pOM1 40%  . CATARACT EXTRACTION W/PHACO Left 01/18/2016   Procedure: CATARACT EXTRACTION PHACO AND INTRAOCULAR LENS PLACEMENT (IOC);  Surgeon: Eulogio Bear, MD;  Location: [facility];  Service: Ophthalmology;  Laterality:  Left;  [place] 1.05 AP% 15.5 CDE 10.16 Fluid Pack Lot # [id number]  . CATARACT EXTRACTION W/PHACO Right 08/01/2016   Procedure: CATARACT EXTRACTION PHACO AND INTRAOCULAR LENS PLACEMENT (IOC);  Surgeon: Eulogio Bear, MD;  Location: [facility];  Service: Ophthalmology;  Laterality: Right;  [place] 01:00.6 AP% 11.4 CDE 6.93  LOT # [id number]  . COLONOSCOPY    . CORONARY ANGIOPLASTY     SENTS 02/12/16  . CORONARY ARTERY BYPASS GRAFT  03/28/15    [facility]: Dr. Waldemar Dickens: LIMA to LAD, SVG to RCA, SVG to OM3, EVH  . DIALYSIS/PERMA CATHETER INSERTION N/A 02/11/2018   Procedure: DIALYSIS/PERMA CATHETER INSERTION;  Surgeon: Algernon Huxley, MD;  Location: [place] [facility];  Service:  Cardiovascular;  Laterality: N/A;  . DIALYSIS/PERMA CATHETER REMOVAL Right 02/04/2018   Procedure: DIALYSIS/PERMA CATHETER REMOVAL;  Surgeon: Katha Cabal, MD;  Location: [place] [facility];  Service: Cardiovascular;  Laterality: Right;  . ESOPHAGOGASTRODUODENOSCOPY (EGD) WITH PROPOFOL N/A 10/24/2016   Procedure: ESOPHAGOGASTRODUODENOSCOPY (EGD) WITH PROPOFOL;  Surgeon: [name], MD;  Location: [facility];  Service: Endoscopy;  Laterality: N/A;  . EYE SURGERY Bilateral    cataract surgery  . EYE SURGERY     drains for glaucoma  . INSERTION EXPRESS TUBE SHUNT Right 08/01/2016   Procedure: INSERTION EXPRESS TUBE SHUNT;  Surgeon: Eulogio Bear, MD;  Location: [facility];  Service: Ophthalmology;  Laterality: Right;  . INSERTION OF AHMED VALVE Left 08/15/2016   Procedure: INSERTION OF AHMED VALVE;  Surgeon: Eulogio Bear, MD;  Location: [facility];  Service: Ophthalmology;  Laterality: Left;  . INSERTION OF DIALYSIS CATHETER    . LOWER EXTREMITY ANGIOGRAPHY Right 02/19/2018   Procedure: Lower Extremity Angiography;  Surgeon: Algernon Huxley, MD;  Location: [place] [facility];  Service: Cardiovascular;  Laterality: Right;  . LOWER EXTREMITY ANGIOGRAPHY Left 01/30/2018   Procedure: Lower Extremity Angiography;  Surgeon: Katha Cabal, MD;  Location: [place] [facility];  Service: Cardiovascular;  Laterality: Left;  . REMOVAL OF A DIALYSIS CATHETER N/A 02/25/2018   Procedure: REMOVAL OF A DIALYSIS CATHETER;  Surgeon: Katha Cabal, MD;  Location: [facility];  Service: Vascular;  Laterality: N/A;  . TEMPORARY DIALYSIS CATHETER N/A 02/06/2018   Procedure: TEMPORARY DIALYSIS CATHETER;  Surgeon: Katha Cabal, MD;  Location: [place] [facility];  Service: Cardiovascular;  Laterality: N/A;  . TRANSTHORACIC ECHOCARDIOGRAM  January 2017    EF 60-65%. GR 2 DD. Mild degenerative mitral valve disease but no prolapse or regurgitation. Mild left atrial dilation. Mild to moderate LVH.  Pericardial effusion gone  . UPPER EXTREMITY ANGIOGRAPHY Left 06/27/2016   Procedure: Upper Extremity Angiography;  Surgeon: Algernon Huxley, MD;  Location: [place] [facility];  Service: Cardiovascular;  Laterality: Left;  . UPPER EXTREMITY ANGIOGRAPHY Left 08/22/2016   Procedure: Upper Extremity Angiography;  Surgeon: Algernon Huxley, MD;  Location: [place] [facility];  Service: Cardiovascular;  Laterality: Left;  Marland Kitchen VASCULAR SURGERY    . WOUND DEBRIDEMENT Left 05/08/2018   Procedure: DEBRIDEMENT OF LEFT LEG BKA;  Surgeon: Algernon Huxley, MD;  Location: [facility];  Service: General;  Laterality: Left;    Prior to Admission medications   Medication Sig Start Date End Date Taking? Authorizing Provider  apixaban (ELIQUIS) 2.5 MG TABS tablet Take 1 tablet (2.5 mg total) by mouth 2 (two) times daily. 02/15/18   Vaughan Basta, MD  aspirin EC 81 MG tablet Take 1 tablet (81 mg total) by mouth daily.  Patient not taking: No sig reported 10/19/16   Dustin Flock, MD  atorvastatin (LIPITOR) 80 MG tablet Take 80 mg by mouth at bedtime.  05/05/17   [provider]  brimonidine (ALPHAGAN) 0.2 % ophthalmic solution Place 1 drop into both eyes 3 (three) times daily. 11/05/19   Ezekiel Slocumb, DO  carvedilol (COREG) 12.5 MG tablet Take 12.5 mg by mouth 2 (two) times daily. Take in combination with 25 mg for total of 37.5 mg twice daily 02/24/20   [provider]  carvedilol (COREG) 25 MG tablet Take 1 tablet (25 mg total) by mouth 2 (two) times daily. 07/13/18   Stark Jock Jude, MD  fluticasone (FLONASE) 50 MCG/ACT nasal spray Place 2 sprays into both nostrils daily.    [provider]  hydrALAZINE (APRESOLINE) 25 MG tablet Take 3 tablets (75 mg total) by mouth every 8 (eight) hours. 04/19/20   Shelly Coss, MD  insulin aspart (NOVOLOG) 100 UNIT/ML injection Inject 0-6 Units into the skin See admin instructions. Inject under the skin according to blood glucose reading:  <201: 0u 201- 250:  2u 251-300: 4u 301-350: 5u 351-400: 6u Patient not taking: No sig reported    [provider]  insulin glargine (LANTUS) 100 UNIT/ML injection Inject 5 Units into the skin daily in the afternoon.     [provider]  insulin lispro (HUMALOG) 100 UNIT/ML KwikPen Inject 2-6 Units into the skin 3 (three) times daily before meals.    [provider]  ipratropium (ATROVENT) 0.06 % nasal spray Place 2 sprays into both nostrils 2 (two) times daily. 09/05/19   Loletha Grayer, MD  isosorbide mononitrate (IMDUR) 60 MG 24 hr tablet Take 1 tablet (60 mg total) by mouth daily. 04/19/20   Shelly Coss, MD  levETIRAcetam (KEPPRA) 250 MG tablet Three times weekly after dialysis Patient taking differently: Take 250 mg by mouth Every Tuesday,Thursday,and Saturday with dialysis. 05/12/18   Loletha Grayer, MD  levETIRAcetam (KEPPRA) 500 MG tablet Take 500 mg by mouth 2 (two) times daily. 02/24/20   [provider]  losartan (COZAAR) 50 MG tablet Take 1 tablet (50 mg total) by mouth daily. 04/19/20   Shelly Coss, MD  meclizine (ANTIVERT) 25 MG tablet Take 25 mg by mouth every 4 (four) hours as needed for dizziness.    [provider]  mirtazapine (REMERON) 15 MG tablet Take 7.5 mg by mouth daily.     [provider]  Omega-3 1000 MG CAPS Take 2 capsules by mouth daily.     [provider]  pantoprazole (PROTONIX) 40 MG tablet Take 1 tablet (40 mg total) by mouth daily. 06/04/19   [name], MD  Polyvinyl Alcohol-Povidone PF (REFRESH) 1.4-0.6 % SOLN Place 1 drop into both eyes 2 (two) times a day.     [provider]  rOPINIRole (REQUIP) 0.25 MG tablet Take 0.25 mg by mouth at bedtime.    [provider]  senna-docusate (SENOKOT-S) 8.6-50 MG tablet Take 1 tablet by mouth 2 (two) times daily. 04/19/20   Shelly Coss, MD  sevelamer carbonate (RENVELA) 800 MG tablet 2 Tablet(s) By Mouth 3 Times Daily    [provider]   thiamine 100 MG tablet Take 1 tablet (100 mg total) by mouth daily. 04/19/20   Shelly Coss, MD  timolol (TIMOPTIC) 0.5 % ophthalmic solution Place 1 drop into both eyes 3 (three) times daily. 03/18/18   Bettey Costa, MD  triamcinolone cream (KENALOG) 0.1 % Apply topically. 11/01/19  [provider]  vitamin B-12 (CYANOCOBALAMIN) 1000 MCG tablet Take 1 tablet (1,000 mcg total) by mouth daily. 02/15/18   Vaughan Basta, MD    Allergies Chlorthalidone, Dilaudid [hydromorphone hcl], Fentanyl, Midazolam, Morphine and related, Ace inhibitors, Angiotensin receptor blockers, Metoclopramide, Norvasc [amlodipine], and Phenergan [promethazine hcl]  Family History  Problem Relation Age of Onset  . Diabetes Mellitus II Mother   . Heart failure Mother   . Pancreatic cancer Father     Social History Social History   Tobacco Use  . Smoking status: Never Smoker  . Smokeless tobacco: Never Used  Vaping Use  . Vaping Use: Never used  Substance Use Topics  . Alcohol use: No  . Drug use: No    Review of Systems Constitutional: No fever/chills Eyes: No visual changes. ENT: No sore throat. Cardiovascular: Denies chest pain. Respiratory: Endorses shortness of breath. Gastrointestinal: No abdominal pain.  No nausea, no vomiting.  No diarrhea. Genitourinary: Negative for dysuria. Musculoskeletal: Negative for acute arthralgias Skin: Negative for rash. Neurological: Negative for headaches, weakness/numbness/paresthesias in any extremity Psychiatric: Negative for suicidal ideation/homicidal ideation   ____________________________________________   PHYSICAL EXAM:  VITAL SIGNS: ED Triage Vitals [05/10/20 0929]  Enc Vitals Group     BP (!) 165/68     Pulse Rate 66     Resp 16     Temp 99.4 F (37.4 C)     Temp Source Oral     SpO2 100 %     Weight 136 lb 11 oz (62 kg)     Height      Head Circumference      Peak Flow      Pain Score 0     Pain Loc      Pain Edu?       Excl. in [place]?    Constitutional: Alert and oriented. Well appearing and in no acute distress. Eyes: Conjunctivae are normal. PERRL. Head: Atraumatic. Nose: No congestion/rhinnorhea. Mouth/Throat: Mucous membranes are moist. Neck: No stridor Cardiovascular: Grossly normal heart sounds.  Good peripheral circulation. Respiratory: 4 L nasal cannula in place, rales over bilateral lung fields Gastrointestinal: Soft and nontender. No distention. Musculoskeletal: Bilateral BKA Neurologic:  Normal speech and language. No gross focal neurologic deficits are appreciated. Skin:  Skin is warm and dry. No rash noted. Psychiatric: Mood and affect are normal. Speech and behavior are normal.  ____________________________________________   LABS (all labs ordered are listed, but only abnormal results are displayed)  Labs Reviewed  COMPREHENSIVE METABOLIC PANEL - Abnormal; Notable for the following components:      Result Value   Potassium 6.5 (*)    Glucose, Bld 51 (*)    BUN 88 (*)    Creatinine, Ser 10.38 (*)    Calcium 8.7 (*)    GFR, Estimated 4 (*)    Anion gap 17 (*)    All other components within normal limits  BRAIN NATRIURETIC PEPTIDE - Abnormal; Notable for the following components:   B Natriuretic Peptide 3,823.4 (*)    All other components within normal limits  CBC WITH DIFFERENTIAL/PLATELET - Abnormal; Notable for the following components:   RBC 3.13 (*)    Hemoglobin 9.5 (*)    HCT 31.5 (*)    MCV 100.6 (*)    RDW 18.6 (*)    Platelets 84 (*)    Lymphs Abs 0.6 (*)    All other components within normal limits  PROTIME-INR - Abnormal; Notable for the following components:   Prothrombin Time  19.1 (*)    INR 1.7 (*)    All other components within normal limits  TROPONIN I (HIGH SENSITIVITY) - Abnormal; Notable for the following components:   Troponin I (High Sensitivity) 80 (*)    All other components within normal limits  TROPONIN I (HIGH SENSITIVITY) - Abnormal;  Notable for the following components:   Troponin I (High Sensitivity) 68 (*)    All other components within normal limits  RESP PANEL BY RT-PCR (FLU A&B, COVID) [id number]   ____________________________________________  EKG  ED ECG REPORT I, Naaman Plummer, the attending physician, personally viewed and interpreted this ECG.  Date: 05/10/2020 EKG Time: 0928 Rate: 66 Rhythm: normal sinus rhythm QRS Axis: normal Intervals: normal ST/T Wave abnormalities: normal Narrative Interpretation: no evidence of acute ischemia  ____________________________________________  RADIOLOGY  ED MD interpretation: Single view portable chest x-ray showed hooves increased airspace disease on the right greater than the left with small bilateral pleural effusions likely representing edema.  No evidence of pneumonia, pneumothorax  Official radiology report(s): DG Chest Port 1 View  Result Date: 05/10/2020 CLINICAL DATA:  Shortness of breath and cough. EXAM: PORTABLE CHEST 1 VIEW COMPARISON:  04/17/2020 FINDINGS: Stable positioning left jugular tunneled dialysis catheter. Lungs demonstrate increased airspace disease, right greater than left with component of small bilateral pleural effusions. Although this may represent primarily CHF/pulmonary edema, pneumonia cannot be excluded, especially in the right mid to lower lung. No pneumothorax. Stable heart size. IMPRESSION: Increased airspace disease, right greater than left, with component of small bilateral pleural effusions. Although this may represent primarily CHF/pulmonary edema, pneumonia cannot be excluded, especially in the right mid to lower lung. Electronically Signed   By: Aletta Edouard M.D.   On: 05/10/2020 09:42    ____________________________________________   PROCEDURES  Procedure(s) performed (including Critical Care):  .1-3 Lead EKG Interpretation Performed by: Naaman Plummer, MD Authorized by: Naaman Plummer, MD     Interpretation:  normal     ECG rate:  67   ECG rate assessment: normal     Rhythm: sinus rhythm     Ectopy: none     Conduction: normal       ____________________________________________   INITIAL IMPRESSION / ASSESSMENT AND PLAN / ED COURSE  As part of my medical decision making, I reviewed the following data within the [facility] notes reviewed and incorporated, Labs reviewed, EKG interpreted, Old chart reviewed, Radiograph reviewed and Notes from prior ED visits reviewed and incorporated         + dyspnea +LE edema + Missed dialysis sessions  Workup: ECG, CBC, BMP, Troponin, BNP, CXR Findings: EKG: No STEMI and no evidence of Brugadas sign, delta wave, epsilon wave, significantly prolonged QTc, or malignant arrhythmia. BNP: 3823 CXR: Pulmonary edema with bilateral pleural effusions Based on history, exam and findings, presentation most consistent with acute on chronic heart failure. Low suspicion for PNA, ACS, tamponade, aortic dissection. Interventions: Oxygen, Diuresis  Disposition (ESRD, missed HD): Plan discharge to dialysis center with renal consult for expedited HD      ____________________________________________   FINAL CLINICAL IMPRESSION(S) / ED DIAGNOSES  Final diagnoses:  SOB (shortness of breath)  Acute pulmonary edema (HCC)  Chronic bilateral pleural effusions  Other hypervolemia     ED Discharge Orders    None       Note:  This document was prepared using Dragon voice recognition software and may include unintentional dictation errors.   Naaman Plummer, MD 05/10/20  [place], Layla Gramm K, MD 05/10/20 1319

## 2020-05-10 NOTE — ED Notes (Signed)
Pt placed on bed alarm by [place] NT

## 2020-05-10 NOTE — ED Notes (Signed)
Dr Cheri Fowler notified of pt potassium 6.5. no new orders at this time

## 2020-05-10 NOTE — ED Notes (Signed)
Patient sleeping at this time. No signs of distress. Will continue to monitor.

## 2020-05-10 NOTE — ED Triage Notes (Signed)
Pt to ED via [facility] from [facility] for chief complaint of shob x3 days and cough.  Ems reports pt was put on 4l [place] on Monday, was 85% when they arrived, they placed pt on 4l [place] and was 100%, not sure if facility was administering correctly.  Pt disoriented to time.

## 2020-05-10 NOTE — ED Notes (Signed)
Missed dialysis Monday and today d/t not feeling well

## 2020-05-13 ENCOUNTER — Emergency Department: Payer: Medicare Other

## 2020-05-13 ENCOUNTER — Inpatient Hospital Stay
Admission: EM | Admit: 2020-05-13 | Discharge: 2020-05-16 | DRG: 640 | Disposition: A | Discharge status: Skilled Nursing Facility | Payer: Medicare Other | Source: Skilled Nursing Facility | Attending: Internal Medicine | Admitting: Internal Medicine

## 2020-05-13 ENCOUNTER — Encounter: Payer: Self-pay | Admitting: *Deleted

## 2020-05-13 ENCOUNTER — Other Ambulatory Visit: Payer: Self-pay

## 2020-05-13 DIAGNOSIS — J9621 Acute and chronic respiratory failure with hypoxia: Secondary | ICD-10-CM | POA: Diagnosis present

## 2020-05-13 DIAGNOSIS — Z862 Personal history of diseases of the blood and blood-forming organs and certain disorders involving the immune mechanism: Secondary | ICD-10-CM | POA: Diagnosis not present

## 2020-05-13 DIAGNOSIS — Z20822 Contact with and (suspected) exposure to covid-19: Secondary | ICD-10-CM | POA: Diagnosis present

## 2020-05-13 DIAGNOSIS — Z7901 Long term (current) use of anticoagulants: Secondary | ICD-10-CM

## 2020-05-13 DIAGNOSIS — N2581 Secondary hyperparathyroidism of renal origin: Secondary | ICD-10-CM | POA: Diagnosis present

## 2020-05-13 DIAGNOSIS — I4510 Unspecified right bundle-branch block: Secondary | ICD-10-CM | POA: Diagnosis present

## 2020-05-13 DIAGNOSIS — Z79899 Other long term (current) drug therapy: Secondary | ICD-10-CM

## 2020-05-13 DIAGNOSIS — E119 Type 2 diabetes mellitus without complications: Secondary | ICD-10-CM

## 2020-05-13 DIAGNOSIS — I469 Cardiac arrest, cause unspecified: Secondary | ICD-10-CM | POA: Diagnosis not present

## 2020-05-13 DIAGNOSIS — Z955 Presence of coronary angioplasty implant and graft: Secondary | ICD-10-CM | POA: Diagnosis not present

## 2020-05-13 DIAGNOSIS — Z9115 Patient's noncompliance with renal dialysis: Secondary | ICD-10-CM

## 2020-05-13 DIAGNOSIS — Z89511 Acquired absence of right leg below knee: Secondary | ICD-10-CM

## 2020-05-13 DIAGNOSIS — E785 Hyperlipidemia, unspecified: Secondary | ICD-10-CM | POA: Diagnosis present

## 2020-05-13 DIAGNOSIS — E1169 Type 2 diabetes mellitus with other specified complication: Secondary | ICD-10-CM | POA: Diagnosis present

## 2020-05-13 DIAGNOSIS — Z992 Dependence on renal dialysis: Secondary | ICD-10-CM | POA: Diagnosis not present

## 2020-05-13 DIAGNOSIS — Z91158 Patient's noncompliance with renal dialysis for other reason: Secondary | ICD-10-CM

## 2020-05-13 DIAGNOSIS — D631 Anemia in chronic kidney disease: Secondary | ICD-10-CM | POA: Diagnosis present

## 2020-05-13 DIAGNOSIS — I468 Cardiac arrest due to other underlying condition: Secondary | ICD-10-CM | POA: Diagnosis present

## 2020-05-13 DIAGNOSIS — Z951 Presence of aortocoronary bypass graft: Secondary | ICD-10-CM

## 2020-05-13 DIAGNOSIS — I2510 Atherosclerotic heart disease of native coronary artery without angina pectoris: Secondary | ICD-10-CM | POA: Diagnosis present

## 2020-05-13 DIAGNOSIS — I10 Essential (primary) hypertension: Secondary | ICD-10-CM | POA: Diagnosis present

## 2020-05-13 DIAGNOSIS — J9601 Acute respiratory failure with hypoxia: Secondary | ICD-10-CM | POA: Diagnosis not present

## 2020-05-13 DIAGNOSIS — E669 Obesity, unspecified: Secondary | ICD-10-CM | POA: Diagnosis present

## 2020-05-13 DIAGNOSIS — I480 Paroxysmal atrial fibrillation: Secondary | ICD-10-CM | POA: Diagnosis present

## 2020-05-13 DIAGNOSIS — I739 Peripheral vascular disease, unspecified: Secondary | ICD-10-CM | POA: Diagnosis present

## 2020-05-13 DIAGNOSIS — I4901 Ventricular fibrillation: Secondary | ICD-10-CM | POA: Diagnosis present

## 2020-05-13 DIAGNOSIS — I5043 Acute on chronic combined systolic (congestive) and diastolic (congestive) heart failure: Secondary | ICD-10-CM | POA: Diagnosis present

## 2020-05-13 DIAGNOSIS — Z90710 Acquired absence of both cervix and uterus: Secondary | ICD-10-CM | POA: Diagnosis not present

## 2020-05-13 DIAGNOSIS — E7800 Pure hypercholesterolemia, unspecified: Secondary | ICD-10-CM | POA: Diagnosis present

## 2020-05-13 DIAGNOSIS — I132 Hypertensive heart and chronic kidney disease with heart failure and with stage 5 chronic kidney disease, or end stage renal disease: Secondary | ICD-10-CM | POA: Diagnosis present

## 2020-05-13 DIAGNOSIS — Z6841 Body Mass Index (BMI) 40.0 and over, adult: Secondary | ICD-10-CM

## 2020-05-13 DIAGNOSIS — G40909 Epilepsy, unspecified, not intractable, without status epilepticus: Secondary | ICD-10-CM | POA: Diagnosis present

## 2020-05-13 DIAGNOSIS — F418 Other specified anxiety disorders: Secondary | ICD-10-CM | POA: Diagnosis present

## 2020-05-13 DIAGNOSIS — N186 End stage renal disease: Secondary | ICD-10-CM | POA: Diagnosis not present

## 2020-05-13 DIAGNOSIS — D61818 Other pancytopenia: Secondary | ICD-10-CM | POA: Diagnosis not present

## 2020-05-13 DIAGNOSIS — E875 Hyperkalemia: Secondary | ICD-10-CM | POA: Diagnosis not present

## 2020-05-13 DIAGNOSIS — Z89512 Acquired absence of left leg below knee: Secondary | ICD-10-CM | POA: Diagnosis not present

## 2020-05-13 DIAGNOSIS — I252 Old myocardial infarction: Secondary | ICD-10-CM

## 2020-05-13 DIAGNOSIS — E1151 Type 2 diabetes mellitus with diabetic peripheral angiopathy without gangrene: Secondary | ICD-10-CM | POA: Diagnosis present

## 2020-05-13 DIAGNOSIS — E1122 Type 2 diabetes mellitus with diabetic chronic kidney disease: Secondary | ICD-10-CM | POA: Diagnosis present

## 2020-05-13 DIAGNOSIS — Z7982 Long term (current) use of aspirin: Secondary | ICD-10-CM

## 2020-05-13 DIAGNOSIS — N189 Chronic kidney disease, unspecified: Secondary | ICD-10-CM | POA: Diagnosis not present

## 2020-05-13 DIAGNOSIS — Z794 Long term (current) use of insulin: Secondary | ICD-10-CM

## 2020-05-13 DIAGNOSIS — K219 Gastro-esophageal reflux disease without esophagitis: Secondary | ICD-10-CM | POA: Diagnosis present

## 2020-05-13 LAB — TROPONIN I (HIGH SENSITIVITY)
Troponin I (High Sensitivity): 145 ng/L (ref <18)
Troponin I (High Sensitivity): 125 ng/L (ref <18)

## 2020-05-13 LAB — BLOOD GAS, VENOUS
Acid-Base Excess: 0.2 mmol/L (ref 0.0–2.0)
Bicarbonate: 27.6 mmol/L (ref 20.0–28.0)
O2 Saturation: 63.2 %
Patient temperature: 37
pCO2, Ven: 60 mmHg (ref 44.0–60.0)
pH, Ven: 7.27 (ref 7.250–7.430)
pO2, Ven: 38 mmHg (ref 32.0–45.0)

## 2020-05-13 LAB — COMPREHENSIVE METABOLIC PANEL
ALT: 19 U/L (ref 0–44)
AST: 24 U/L (ref 15–41)
Albumin: 3.2 g/dL — ABNORMAL LOW (ref 3.5–5.0)
Alkaline Phosphatase: 75 U/L (ref 38–126)
Anion gap: 15 (ref 5–15)
BUN: 75 mg/dL — ABNORMAL HIGH (ref 8–23)
CO2: 25 mmol/L (ref 22–32)
Calcium: 7.9 mg/dL — ABNORMAL LOW (ref 8.9–10.3)
Chloride: 96 mmol/L — ABNORMAL LOW (ref 98–111)
Creatinine, Ser: 10.8 mg/dL — ABNORMAL HIGH (ref 0.44–1.00)
GFR, Estimated: 4 mL/min — ABNORMAL LOW (ref >60)
Glucose, Bld: 155 mg/dL — ABNORMAL HIGH (ref 70–99)
Potassium: 6.2 mmol/L — ABNORMAL HIGH (ref 3.5–5.1)
Sodium: 136 mmol/L (ref 135–145)
Total Bilirubin: 0.8 mg/dL (ref 0.3–1.2)
Total Protein: 6.8 g/dL (ref 6.5–8.1)

## 2020-05-13 LAB — CBC WITH DIFFERENTIAL/PLATELET
Abs Immature Granulocytes: 0.03 10*3/uL (ref 0.00–0.07)
Basophils Absolute: 0 10*3/uL (ref 0.0–0.1)
Basophils Relative: 0 %
Eosinophils Absolute: 0 10*3/uL (ref 0.0–0.5)
Eosinophils Relative: 1 %
HCT: 28.5 % — ABNORMAL LOW (ref 36.0–46.0)
Hemoglobin: 8.4 g/dL — ABNORMAL LOW (ref 12.0–15.0)
Immature Granulocytes: 1 %
Lymphocytes Relative: 22 %
Lymphs Abs: 1 10*3/uL (ref 0.7–4.0)
MCH: 30 pg (ref 26.0–34.0)
MCHC: 29.5 g/dL — ABNORMAL LOW (ref 30.0–36.0)
MCV: 101.8 fL — ABNORMAL HIGH (ref 80.0–100.0)
Monocytes Absolute: 0.3 10*3/uL (ref 0.1–1.0)
Monocytes Relative: 7 %
Neutro Abs: 3.2 10*3/uL (ref 1.7–7.7)
Neutrophils Relative %: 69 %
Platelets: 71 10*3/uL — ABNORMAL LOW (ref 150–400)
RBC: 2.8 MIL/uL — ABNORMAL LOW (ref 3.87–5.11)
RDW: 18.6 % — ABNORMAL HIGH (ref 11.5–15.5)
WBC: 4.6 10*3/uL (ref 4.0–10.5)
nRBC: 0 % (ref 0.0–0.2)

## 2020-05-13 LAB — LACTIC ACID, PLASMA
Lactic Acid, Venous: 1.8 mmol/L (ref 0.5–1.9)
Lactic Acid, Venous: 0.7 mmol/L (ref 0.5–1.9)

## 2020-05-13 LAB — MAGNESIUM: Magnesium: 2.3 mg/dL (ref 1.7–2.4)

## 2020-05-13 LAB — SARS CORONAVIRUS 2 BY RT PCR (HOSPITAL ORDER, PERFORMED IN ~~LOC~~ HOSPITAL LAB): SARS Coronavirus 2: NEGATIVE

## 2020-05-13 MED ORDER — IPRATROPIUM-ALBUTEROL 0.5-2.5 (3) MG/3ML IN SOLN: 3.0000 mL | Freq: Four times a day (QID) | RESPIRATORY_TRACT | Status: DC | PRN

## 2020-05-13 MED ORDER — CALCIUM GLUCONATE 10 % IV SOLN
INTRAVENOUS | Status: AC
  Filled 2020-05-13: qty 10

## 2020-05-13 MED ORDER — PANTOPRAZOLE SODIUM 40 MG PO TBEC
40.0000 mg | DELAYED_RELEASE_TABLET | Freq: Every day | ORAL | Status: DC
  Administered 2020-05-14 – 2020-05-16 (×3): 40 mg via ORAL
  Filled 2020-05-13 (×3): qty 1

## 2020-05-13 MED ORDER — SODIUM ZIRCONIUM CYCLOSILICATE 10 G PO PACK
10.0000 g | PACK | Freq: Once | ORAL | Status: AC
  Administered 2020-05-14: 10 g via ORAL
  Filled 2020-05-13: qty 1

## 2020-05-13 MED ORDER — POLYETHYLENE GLYCOL 3350 17 G PO PACK: 17.0000 g | PACK | Freq: Every day | ORAL | Status: DC | PRN

## 2020-05-13 MED ORDER — ACETAMINOPHEN 325 MG PO TABS: 650.0000 mg | ORAL_TABLET | ORAL | Status: DC | PRN

## 2020-05-13 MED ORDER — LEVETIRACETAM 500 MG PO TABS
500.0000 mg | ORAL_TABLET | Freq: Two times a day (BID) | ORAL | Status: DC
  Administered 2020-05-13 – 2020-05-16 (×6): 500 mg via ORAL
  Filled 2020-05-13 (×7): qty 1

## 2020-05-13 MED ORDER — SODIUM BICARBONATE 8.4 % IV SOLN
50.0000 meq | Freq: Once | INTRAVENOUS | Status: AC
  Administered 2020-05-13: 50 meq via INTRAVENOUS

## 2020-05-13 MED ORDER — CARVEDILOL 6.25 MG PO TABS
25.0000 mg | ORAL_TABLET | Freq: Two times a day (BID) | ORAL | Status: DC
  Administered 2020-05-14 – 2020-05-16 (×5): 25 mg via ORAL
  Filled 2020-05-13: qty 4
  Filled 2020-05-13 (×3): qty 1
  Filled 2020-05-13: qty 4

## 2020-05-13 MED ORDER — CALCIUM GLUCONATE-NACL 1-0.675 GM/50ML-% IV SOLN
1.0000 g | Freq: Once | INTRAVENOUS | Status: AC
  Administered 2020-05-13: 1000 mg via INTRAVENOUS
  Filled 2020-05-13: qty 50

## 2020-05-13 MED ORDER — INSULIN ASPART 100 UNIT/ML ~~LOC~~ SOLN: 0.0000 [IU] | Freq: Three times a day (TID) | SUBCUTANEOUS | Status: DC

## 2020-05-13 MED ORDER — DOCUSATE SODIUM 100 MG PO CAPS
100.0000 mg | ORAL_CAPSULE | Freq: Two times a day (BID) | ORAL | Status: DC | PRN
Start: 2020-05-13 — End: 2020-05-16

## 2020-05-13 MED ORDER — ONDANSETRON HCL 4 MG/2ML IJ SOLN: 4.0000 mg | Freq: Four times a day (QID) | INTRAMUSCULAR | Status: DC | PRN

## 2020-05-13 NOTE — ED Notes (Signed)
Date and time results received: 05/13/20 6:49 PM   Test:Troponin Critical Value: 145  Name of Provider Notified:Stafford MD

## 2020-05-13 NOTE — ED Provider Notes (Signed)
[facility] Emergency Department Provider Note  ____________________________________________  Time seen: Approximately 8:08 PM  I have reviewed the triage vital signs and the nursing notes.   HISTORY  Chief Complaint Cardiac Arrest (Cardiac arrest with ems pta, 2-3 minutes of CPR (compressions and bagged pt) before ROSC and she regained consciousness)    Level 5 Caveat: Portions of the History and Physical including HPI and review of systems are unable to be completely obtained due to patient confusion and critical illness  HPI Stephanie Ellis is a 70 y.o. female with a history of diastolic heart failure, ESRD on hemodialysis, GERD, diabetes, CAD status post CABG who was brought to the ED by EMS from her nursing home due to syncope.  Reportedly the patient has not had dialysis in at least a week due to refusing to go.  EMS reports that during their transport, the patient suffered ventricular fibrillation cardiac arrest.  They did CPR for about 4 minutes, obtaining return of spontaneous circulation without administering any medications.  Patient currently reports shortness of breath and cough.  Denies any pain.      Past Medical History:  Diagnosis Date  . (HFpEF) heart failure with preserved ejection fraction ([place])    a. 01/2018 Echo: EF 50-55%, no rwma, Gr1 DD, triv AI, mild MR. Midly dil LA/RV, mod reduced RV fxn. Irreg thickening of TV w/ mobile echodensity that appears to arise from valve.  . Anemia   . Anorexia   . Bacteremia due to Pseudomonas   . Carotid arterial disease ([place])    a. 01/2017 Carotid U/S: RICA <67, LICA <20.  Marland Kitchen CHF (congestive heart failure) ([place])   . Complication of anesthesia    a. Pt reports h/o complication on 9 different occasions - ? hypotension/arrest.  . 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 01/2015 Cath [facility]): 70% LM, p-mLAD 50-60% (Resting FFR 0.75), mRCA 80-90%, ~40 Ost OM & D1-->CABG; b.  01/2016 Staged PCI of LCX x 2 and RCA.  Marland Kitchen ESRD (end stage renal disease) on dialysis ([name])    a. ESRD secondary to acute kidney failure s/p CABG-->PD  . Essential hypertension   . GERD (gastroesophageal reflux disease)   . Heart murmur   . Hypercholesterolemia   . Myocardial infarction ([name]) 2016  . PVD (peripheral vascular disease) ([place])    a. 01/30/2018 PV Angio: Sev Left Popliteal dzs s/p PTA w/ 71m lutonix DEB w/ mech aspiration of the L popliteal, L AT, and Left tibioperoneal trunck and peroneal artery.  . 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  [facility]: Dr. BMarland KitchenHaithcock: CABG x 3, LIMA to LAD, SVG to RCA, SVG to OM3, EVH  . Sinusitis 2019  . Type II diabetes mellitus with complication ([name]    CAD     Patient Active Problem List   Diagnosis Date Noted  . AMS (altered mental status) 04/10/2020  . Chronic combined systolic and diastolic CHF (congestive heart failure) ([place] 04/02/2020  . Thrombocytopenia ([place] 04/02/2020  . Chronic respiratory failure with hypoxia ([place] 11/03/2019  . Nocturnal hypoxia   . Shortness of breath   . Abdominal swelling   . Acute on chronic systolic CHF (congestive heart failure) ([place]   . Acute respiratory failure with hypoxia ([place] 09/02/2019  . Acute on chronic combined systolic (congestive) and diastolic (congestive) heart failure ([place] 09/02/2019  . 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 Severe anemia 05/01/2019  . Pneumonia due to COVID-19 virus 05/01/2019  .  Resides in skilled nursing facility 12/29/2018  . CVA (cerebral vascular accident) ([place]) 07/08/2018  . Infection of amputation stump ([place]) 05/02/2018  . BKA stump complication ([place]) 92/02/9416  . Unilateral complete BKA, right, initial encounter ([place]) 03/30/2018  . Transient loss of consciousness 03/19/2018  . Transient alteration of awareness   . VT (ventricular tachycardia) ([place])   . Disorientation   . Hypertensive urgency 03/08/2018  . Status post below-knee amputation of both lower  extremities ([place]) 03/02/2018  . Ischemia of extremity 02/18/2018  . CKD (chronic kidney disease) stage 5, GFR less than 15 ml/min (HCC) 02/16/2018  . Diabetic osteomyelitis ([place])   . Peripheral vascular disease ([place])   . Advanced care planning/counseling discussion   . Palliative care by specialist   . Goals of care, counseling/discussion   . Type 2 diabetes mellitus with hyperlipidemia ([place]) 01/30/2018  . Sepsis ([place]) 01/29/2018  . Peripheral neuropathy 06/17/2017  . Seizure disorder ([place]) 02/06/2017  . Accelerated hypertension 02/06/2017  . Endophthalmitis, left eye 12/05/2016  . Vomiting 10/24/2016  . Hematuria 10/17/2016  . Acute lower UTI 10/17/2016  . Anesthesia complication 40/81/4481  . Chest pain 09/04/2016  . Steal syndrome dialysis vascular access, initial encounter ([place]) 06/18/2016  . 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 Coronary artery disease involving coronary bypass graft of native heart with angina pectoris ([place]) 02/10/2016  . Hypertension 02/02/2016  . Hyperlipidemia 02/02/2016  . Coronary artery disease involving left main coronary artery 10/04/2015  . Abdominal pain of unknown etiology 10/04/2015  . ESRD (end stage renal disease) on dialysis ([place])   . Type II diabetes mellitus with complication ([place])   . NSTEMI (non-ST elevated myocardial infarction) ([place])   . Right sided abdominal pain   . Elevated troponin 10/03/2015  . 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 Acute on chronic combined systolic and diastolic CHF (congestive heart failure) ([place]) 04/19/2014  . Anemia 09/20/2013  . Routine health maintenance 01/29/2013     Past Surgical History:  Procedure Laterality Date  . ABDOMINAL HYSTERECTOMY    . AMPUTATION Right 02/25/2018   Procedure: AMPUTATION BELOW KNEE;  Surgeon: Katha Cabal, MD;  Location: [facility] ORS;  Service: Vascular;  Laterality: Right;  . AMPUTATION Left 03/11/2018   Procedure: AMPUTATION BELOW KNEE;  Surgeon: Algernon Huxley, MD;  Location: [facility] ORS;  Service: Vascular;  Laterality: Left;  . AMPUTATION Bilateral 04/13/2018   Procedure: REVISION OF BILATERAL AMPUTATIONS BELOW KNEE WITH WOUND VAC APPLICATIONS;  Surgeon: Evaristo Bury, MD;  Location: [facility] ORS;  Service: Vascular;  Laterality: Bilateral;  . AMPUTATION TOE Left 02/06/2018   Procedure: AMPUTATION GREAT TOE;  Surgeon: Samara Deist, DPM;  Location: [facility] ORS;  Service: Podiatry;  Laterality: Left;  . APPLICATION OF WOUND VAC Left 04/13/2018   Procedure: APPLICATION OF WOUND VAC;  Surgeon: Evaristo Bury, MD;  Location: [facility] OR[facility];  Service: Vascular;  Laterality: Left;  . ARTERIOVENOUS GRAFT PLACEMENT  05/2016  . AV FISTULA PLACEMENT Left 05/30/2016   Procedure: ARTERIOVENOUS graft;  Surgeon: Algernon Huxley, MD;  Location: [facility] OR[facility];  Service: Vascular;  Laterality: Left;  . CAPD INSERTION N/A 10/30/2017   Procedure: LAPAROSCOPIC INSERTION CONTINUOUS AMBULATORY PERITONEAL DIALYSIS  (CAPD) CATHETER;  Surgeon: Algernon Huxley, MD;  Location: [facility] ORS;  Service: Vascular;  Laterality: N/A;  . CARDIAC CATHETERIZATION  01/2015   [facility]: Ost LM 70%, p-m LAD 50-60% (Rest FFR + @ 0.75), mRCA 80-90%, ostD1 40%, pOM1 40%  . CATARACT EXTRACTION W/PHACO Left 01/18/2016  Procedure: CATARACT EXTRACTION PHACO AND INTRAOCULAR LENS PLACEMENT (IOC);  Surgeon: Eulogio Bear, MD;  Location: [facility] OR[facility];  Service: Ophthalmology;  Laterality: Left;  [place] 1.05 AP% 15.5 CDE 10.16 Fluid Pack Lot # [id number]  . CATARACT EXTRACTION W/PHACO Right 08/01/2016   Procedure: CATARACT EXTRACTION PHACO AND INTRAOCULAR LENS PLACEMENT (IOC);  Surgeon: Eulogio Bear, MD;  Location: [facility] ORS;  Service: Ophthalmology;  Laterality: Right;  [place] 01:00.6 AP% 11.4 CDE 6.93  LOT # [id number]  . COLONOSCOPY    . CORONARY ANGIOPLASTY     SENTS 02/12/16  . CORONARY ARTERY BYPASS GRAFT  03/28/15    [facility]: Dr. Waldemar Dickens: LIMA to LAD, SVG to RCA, SVG to OM3, EVH  . DIALYSIS/PERMA CATHETER INSERTION N/A  02/11/2018   Procedure: DIALYSIS/PERMA CATHETER INSERTION;  Surgeon: Algernon Huxley, MD;  Location: [place] CV LAB;  Service: Cardiovascular;  Laterality: N/A;  . DIALYSIS/PERMA CATHETER REMOVAL Right 02/04/2018   Procedure: DIALYSIS/PERMA CATHETER REMOVAL;  Surgeon: Katha Cabal, MD;  Location: [place] CV LAB;  Service: Cardiovascular;  Laterality: Right;  . ESOPHAGOGASTRODUODENOSCOPY (EGD) WITH PROPOFOL N/A 10/24/2016   Procedure: ESOPHAGOGASTRODUODENOSCOPY (EGD) WITH PROPOFOL;  Surgeon: [name], MD;  Location: [facility] END[facility];  Service: Endoscopy;  Laterality: N/A;  . EYE SURGERY Bilateral    cataract surgery  . EYE SURGERY     drains for glaucoma  . INSERTION EXPRESS TUBE SHUNT Right 08/01/2016   Procedure: INSERTION EXPRESS TUBE SHUNT;  Surgeon: Eulogio Bear, MD;  Location: [facility];  Service: Ophthalmology;  Laterality: Right;  . INSERTION OF AHMED VALVE Left 08/15/2016   Procedure: INSERTION OF AHMED VALVE;  Surgeon: Eulogio Bear, MD;  Location: [facility];  Service: Ophthalmology;  Laterality: Left;  . INSERTION OF DIALYSIS CATHETER    . LOWER EXTREMITY ANGIOGRAPHY Right 02/19/2018   Procedure: Lower Extremity Angiography;  Surgeon: Algernon Huxley, MD;  Location: [place] CV LAB;  Service: Cardiovascular;  Laterality: Right;  . LOWER EXTREMITY ANGIOGRAPHY Left 01/30/2018   Procedure: Lower Extremity Angiography;  Surgeon: Katha Cabal, MD;  Location: [place] CV LAB;  Service: Cardiovascular;  Laterality: Left;  . REMOVAL OF A DIALYSIS CATHETER N/A 02/25/2018   Procedure: REMOVAL OF A DIALYSIS CATHETER;  Surgeon: Katha Cabal, MD;  Location: [facility];  Service: Vascular;  Laterality: N/A;  . TEMPORARY DIALYSIS CATHETER N/A 02/06/2018   Procedure: TEMPORARY DIALYSIS CATHETER;  Surgeon: Katha Cabal, MD;  Location: [place] CV LAB;  Service: Cardiovascular;  Laterality: N/A;  . TRANSTHORACIC ECHOCARDIOGRAM  January 2017    EF 60-65%.  GR 2 DD. Mild degenerative mitral valve disease but no prolapse or regurgitation. Mild left atrial dilation. Mild to moderate LVH. Pericardial effusion gone  . UPPER EXTREMITY ANGIOGRAPHY Left 06/27/2016   Procedure: Upper Extremity Angiography;  Surgeon: Algernon Huxley, MD;  Location: [place] CV LAB;  Service: Cardiovascular;  Laterality: Left;  . UPPER EXTREMITY ANGIOGRAPHY Left 08/22/2016   Procedure: Upper Extremity Angiography;  Surgeon: Algernon Huxley, MD;  Location: [place] CV LAB;  Service: Cardiovascular;  Laterality: Left;  Marland Kitchen VASCULAR SURGERY    . WOUND DEBRIDEMENT Left 05/08/2018   Procedure: DEBRIDEMENT OF LEFT LEG BKA;  Surgeon: Algernon Huxley, MD;  Location: [facility] OR[facility];  Service: General;  Laterality: Left;     Prior to Admission medications   Medication Sig Start Date End Date Taking? Authorizing Provider  apixaban (ELIQUIS) 2.5 MG TABS tablet Take 1 tablet (2.5 mg total) by mouth  2 (two) times daily. 02/15/18   Vaughan Basta, MD  aspirin EC 81 MG tablet Take 1 tablet (81 mg total) by mouth daily. Patient not taking: No sig reported 10/19/16   Dustin Flock, MD  atorvastatin (LIPITOR) 80 MG tablet Take 80 mg by mouth at bedtime.  05/05/17   [provider]  brimonidine (ALPHAGAN) 0.2 % ophthalmic solution Place 1 drop into both eyes 3 (three) times daily. 11/05/19   Ezekiel Slocumb, DO  carvedilol (COREG) 12.5 MG tablet Take 12.5 mg by mouth 2 (two) times daily. Take in combination with 25 mg for total of 37.5 mg twice daily 02/24/20   [provider]  carvedilol (COREG) 25 MG tablet Take 1 tablet (25 mg total) by mouth 2 (two) times daily. 07/13/18   Stark Jock Jude, MD  fluticasone (FLONASE) 50 MCG/ACT nasal spray Place 2 sprays into both nostrils daily.    [provider]  hydrALAZINE (APRESOLINE) 25 MG tablet Take 3 tablets (75 mg total) by mouth every 8 (eight) hours. 04/19/20   Shelly Coss, MD  insulin aspart (NOVOLOG) 100 UNIT/ML injection  Inject 0-6 Units into the skin See admin instructions. Inject under the skin according to blood glucose reading:  <201: 0u 201- 250: 2u 251-300: 4u 301-350: 5u 351-400: 6u Patient not taking: No sig reported    [provider]  insulin glargine (LANTUS) 100 UNIT/ML injection Inject 5 Units into the skin daily in the afternoon.     [provider]  insulin lispro (HUMALOG) 100 UNIT/ML KwikPen Inject 2-6 Units into the skin 3 (three) times daily before meals.    [provider]  ipratropium (ATROVENT) 0.06 % nasal spray Place 2 sprays into both nostrils 2 (two) times daily. 09/05/19   Loletha Grayer, MD  isosorbide mononitrate (IMDUR) 60 MG 24 hr tablet Take 1 tablet (60 mg total) by mouth daily. 04/19/20   Shelly Coss, MD  levETIRAcetam (KEPPRA) 250 MG tablet Three times weekly after dialysis Patient taking differently: Take 250 mg by mouth Every Tuesday,Thursday,and Saturday with dialysis. 05/12/18   Loletha Grayer, MD  levETIRAcetam (KEPPRA) 500 MG tablet Take 500 mg by mouth 2 (two) times daily. 02/24/20   [provider]  losartan (COZAAR) 50 MG tablet Take 1 tablet (50 mg total) by mouth daily. 04/19/20   Shelly Coss, MD  meclizine (ANTIVERT) 25 MG tablet Take 25 mg by mouth every 4 (four) hours as needed for dizziness.    [provider]  mirtazapine (REMERON) 15 MG tablet Take 7.5 mg by mouth daily.     [provider]  Omega-3 1000 MG CAPS Take 2 capsules by mouth daily.     [provider]  pantoprazole (PROTONIX) 40 MG tablet Take 1 tablet (40 mg total) by mouth daily. 06/04/19   [name], MD  Polyvinyl Alcohol-Povidone PF (REFRESH) 1.4-0.6 % SOLN Place 1 drop into both eyes 2 (two) times a day.     [provider]  rOPINIRole (REQUIP) 0.25 MG tablet Take 0.25 mg by mouth at bedtime.    [provider]  senna-docusate (SENOKOT-S) 8.6-50 MG tablet Take 1 tablet by mouth 2 (two) times daily.  04/19/20   Shelly Coss, MD  sevelamer carbonate (RENVELA) 800 MG tablet 2 Tablet(s) By Mouth 3 Times Daily    [provider]  thiamine 100 MG tablet Take 1 tablet (100 mg total) by mouth daily. 04/19/20   Shelly Coss, MD  timolol (TIMOPTIC) 0.5 % ophthalmic solution Place 1  drop into both eyes 3 (three) times daily. 03/18/18   Bettey Costa, MD  triamcinolone cream (KENALOG) 0.1 % Apply topically. 11/01/19   [provider]  vitamin B-12 (CYANOCOBALAMIN) 1000 MCG tablet Take 1 tablet (1,000 mcg total) by mouth daily. 02/15/18   Vaughan Basta, MD     Allergies Chlorthalidone, Dilaudid [hydromorphone hcl], Fentanyl, Midazolam, Morphine and related, Ace inhibitors, Angiotensin receptor blockers, Metoclopramide, Norvasc [amlodipine], and Phenergan [promethazine hcl]   Family History  Problem Relation Age of Onset  . Diabetes Mellitus II Mother   . Heart failure Mother   . Pancreatic cancer Father     Social History Social History   Tobacco Use  . Smoking status: Never Smoker  . Smokeless tobacco: Never Used  Vaping Use  . Vaping Use: Never used  Substance Use Topics  . Alcohol use: No  . Drug use: No    Review of Systems Level 5 Caveat: Portions of the History and Physical including HPI and review of systems are unable to be completely obtained due to patient being a poor historian   Constitutional:   No known fever.  ENT:   No rhinorrhea. Cardiovascular:   No chest pain or syncope. Respiratory:   Positive shortness of breath and cough. Gastrointestinal:   Negative for abdominal pain, vomiting and diarrhea.  Musculoskeletal:   Negative for focal pain or swelling ____________________________________________   PHYSICAL EXAM:  VITAL SIGNS: ED Triage Vitals  Enc Vitals Group     BP 05/13/20 1801 (!) 177/78     Pulse Rate 05/13/20 1801 (!) 51     Resp 05/13/20 1801 18     Temp --      Temp src --      SpO2 05/13/20 1815 100 %     Weight  05/13/20 1914 132 lb 4.4 oz (60 kg)     Height --      Head Circumference --      Peak Flow --      Pain Score 05/13/20 1913 0     Pain Loc --      Pain Edu? --      Excl. in [place]? --     Vital signs reviewed, nursing assessments reviewed.   Constitutional:   Alert and oriented to person and place.  Ill-appearing. Eyes:   Conjunctivae are normal. EOMI. PERRL. ENT      Head:   Normocephalic and atraumatic.      Nose:   No congestion/rhinnorhea.       Mouth/Throat:   MMM, no pharyngeal erythema. No peritonsillar mass.       Neck:   No meningismus. Full ROM.  Pronounced JVD Hematological/Lymphatic/Immunilogical:   No cervical lymphadenopathy. Cardiovascular:   RRR, rate of 60. Symmetric bilateral radial pulses.  No murmurs. Cap refill less than 2 seconds. Respiratory:   Normal respiratory effort without tachypnea/retractions. Breath sounds are clear and equal bilaterally. No wheezes/rales/rhonchi. Gastrointestinal:   Soft and nontender. Non distended. There is no CVA tenderness.  No rebound, rigidity, or guarding.  Musculoskeletal: Bilateral AKA.  Normal range of motion in all extremities. Neurologic:   Normal speech, limited language expression.  Motor grossly intact.  Skin:    Skin is warm, dry and intact. No rash noted.  No petechiae, purpura, or bullae.  ____________________________________________    LABS (pertinent positives/negatives) (all labs ordered are listed, but only abnormal results are displayed) Labs Reviewed  COMPREHENSIVE METABOLIC PANEL - Abnormal; Notable for the following components:  Result Value   Potassium 6.2 (*)    Chloride 96 (*)    Glucose, Bld 155 (*)    BUN 75 (*)    Creatinine, Ser 10.80 (*)    Calcium 7.9 (*)    Albumin 3.2 (*)    GFR, Estimated 4 (*)    All other components within normal limits  CBC WITH DIFFERENTIAL/PLATELET - Abnormal; Notable for the following components:   RBC 2.80 (*)    Hemoglobin 8.4 (*)    HCT 28.5 (*)     MCV 101.8 (*)    MCHC 29.5 (*)    RDW 18.6 (*)    Platelets 71 (*)    All other components within normal limits  TROPONIN I (HIGH SENSITIVITY) - Abnormal; Notable for the following components:   Troponin I (High Sensitivity) 145 (*)    All other components within normal limits  SARS CORONAVIRUS 2 BY RT PCR (HOSPITAL ORDER, [place] [facility])  LACTIC ACID, PLASMA  BLOOD GAS, VENOUS  LACTIC ACID, PLASMA  MAGNESIUM  TROPONIN I (HIGH SENSITIVITY)   ____________________________________________   EKG  Interpreted by me Normal sinus rhythm rate of 61, left axis, normal intervals.  Right bundle branch block.  LVH.  Normal ST segments, T wave inversions in inferior and lateral leads.  Unchanged from previous EKG May 10, 2020.  ____________________________________________    RADIOLOGY  DG Chest Portable 1 View  Result Date: 05/13/2020 CLINICAL DATA:  70 year old female status post cardiac arrest. EXAM: PORTABLE CHEST 1 VIEW COMPARISON:  Chest radiograph dated 05/10/2020. FINDINGS: Dialysis catheter in similar position. There is cardiomegaly with vascular congestion and small bilateral pleural effusions. No pneumothorax. Median sternotomy wires and CABG vascular clips. Atherosclerotic calcification of the aorta. No acute osseous pathology. IMPRESSION: Cardiomegaly with vascular congestion and small bilateral pleural effusions. Electronically Signed   By: Anner Crete M.D.   On: 05/13/2020 19:12    ____________________________________________   PROCEDURES .Critical Care Performed by: Carrie Mew, MD Authorized by: Carrie Mew, MD   Critical care provider statement:    Critical care time (minutes):  35   Critical care time was exclusive of:  Separately billable procedures and treating other patients   Critical care was necessary to treat or prevent imminent or life-threatening deterioration of the following conditions:  Cardiac failure, renal  failure, metabolic crisis and circulatory failure   Critical care was time spent personally by me on the following activities:  Development of treatment plan with patient or surrogate, discussions with consultants, evaluation of patient's response to treatment, examination of patient, obtaining history from patient or surrogate, ordering and performing treatments and interventions, ordering and review of laboratory studies, ordering and review of radiographic studies, pulse oximetry, re-evaluation of patient's condition and review of old charts Comments:        .1-3 Lead EKG Interpretation Performed by: Carrie Mew, MD Authorized by: Carrie Mew, MD     Interpretation: normal     ECG rate:  61   ECG rate assessment: normal     Rhythm: sinus rhythm     Ectopy: none     Conduction: normal   Comments:        Angiocath insertion  Date/Time: 05/13/2020 8:20 PM Performed by: Carrie Mew, MD Authorized by: Carrie Mew, MD  Consent: The procedure was performed in an emergent situation. Local anesthesia used: no  Anesthesia: Local anesthesia used: no  Sedation: Patient sedated: no  Patient tolerance: patient tolerated the procedure well with no  immediate complications Comments: 59C to R EJ, 1 attempt, EBL 0, no complications     ____________________________________________  DIFFERENTIAL DIAGNOSIS   Hyperkalemia, volume overload, pleural effusion, pulmonary edema, pneumonia, COVID, electrolyte abnormality, non-STEMI  CLINICAL IMPRESSION / ASSESSMENT AND PLAN / ED COURSE  Medications ordered in the ED: Medications  calcium gluconate 1 g/ 50 mL sodium chloride IVPB (has no administration in time range)  calcium gluconate 10 % injection (  Given 05/13/20 1805)  sodium bicarbonate injection 50 mEq (50 mEq Intravenous Given 05/13/20 1811)    Pertinent labs & imaging results that were available during my care of the patient were reviewed by me and  considered in my medical decision making (see chart for details).   Marijose Curington was evaluated in Emergency Department on 05/13/2020 for the symptoms described in the history of present illness. She was evaluated in the context of the global COVID-19 pandemic, which necessitated consideration that the patient might be at risk for infection with the SARS-CoV-2 virus that causes COVID-19. Institutional protocols and algorithms that pertain to the evaluation of patients at risk for COVID-19 are in a state of rapid change based on information released by regulatory bodies including the CDC and federal and state organizations. These policies and algorithms were followed during the patient's care in the ED.   Patient presents with syncope at home, experienced cardiac arrest by EMS which they were able to quickly resuscitate.  Patient given IV calcium and sodium bicarbonate empirically on arrival due to suspected hyperkalemia due to missed dialysis.  Labs show potassium of 6.2, no acute EKG changes.  Chest x-ray viewed and interpreted by me, shows small pleural effusions, but overall unremarkable.  Radiology report agrees.  COVID-negative.  Will continue monitoring, defibrillator pads are on the patient.  Case discussed with nephrology will plan for urgent dialysis.  Discussed with ICU team who feels patient does not require ICU level care if they remain stable.  Will admit to hospitalist.      ____________________________________________   FINAL CLINICAL IMPRESSION(S) / ED DIAGNOSES    Final diagnoses:  Cardiac arrest ([place])  ESRD on hemodialysis ([place])  Hyperkalemia  Type 2 diabetes mellitus without complication, with long-term current use of insulin [facility])     ED Discharge Orders    None      Portions of this note were generated with dragon dictation software. Dictation errors may occur despite best attempts at proofreading.   Carrie Mew, MD 05/13/20 2021

## 2020-05-13 NOTE — ED Notes (Signed)
Asmitting MD is at bedside

## 2020-05-13 NOTE — H&P (Addendum)
History and Physical    Stephanie Ellis HWE:[id number] DOB: [birth year] DOA: 05/13/2020  PCP: Pcp, No  Patient coming from: NH  I have personally briefly reviewed patient's old medical records in [place]  Chief Complaint: post v -fib cardiac arrest  HPI: Stephanie Ellis is a 70 y.o. female with medical history significant of   diabetes mellitus , ESRD on HD (M/W/F) , hypertension, PAD s/p bilateral BKA,coronary artery disease status post CABG, Afib on apixaban, hx of PEA arrest during  admission 12/16  s/p administration of dilaudid, followed by admission 12/20-12/29/21 at which time she had chief complaint of change in mental status as was r/o for CVA, in additional patient was also treated for CAP.  Patient since the has had interim history of admission to ED 05/11/19 at which time she was diagnosed with fluid overload due to missing  HD sessions. Patient per notes appears to have history of refusing HD. In any event patient was discharged to follow with HD center. It appears while at [facility] patient continued to refuse HD and due this per chart has missed HD x 1 week. Today at [facility] patient was noted to have syncopal episodes and EMS was called. ON route to ED patient had v-fib arrest, she was given cpr for around 4 mins , no medications with ROSC. Patient s/p arrest was noted to be somnolent, with minimal O2 requirement without need for pressors.  Currently patient states she does not remember any of the event that transpired today. She notes for the last two weeks she has felt generally unwell, she note feeling congested with cough, but no fever/ chills/bodyaches, no n/v/d/abdomininal pain, no chest pain , but has note shortness of breath.  She states she has note attended HD because she has felt unwell.   ED Course:  On arrival to ed  Afeb, hr 51, bp 177/78, sat 100% on 4L  Labs: Wbc:4.6, hgb 8.4 ( prior 9.5), mcv 101, plt71 Lactic 1.8 NA 136, ,K6.2, CL96, bicarb 25, glu 155, cr 10.8,calcium  7.9 ablumin 3.2 Ce:145 up from 68, tx calcium gluconate vBG: ph 7/27/pco2 60, COVID swab negative ELF:YBOFB at 61, pac, RBBB,LAFB, no acute st-t wave chagnes cxr Cardiomegaly with vascular congestion and small bilateral pleural Effusions.  tx calcium gluconate x 2 ,bicarb x 1 Review of Systems: As per HPI otherwise 10 point review of systems negative.   Past Medical History:  Diagnosis Date  . (HFpEF) heart failure with preserved ejection fraction ([place])    a. 01/2018 Echo: EF 50-55%, no rwma, Gr1 DD, triv AI, mild MR. Midly dil LA/RV, mod reduced RV fxn. Irreg thickening of TV w/ mobile echodensity that appears to arise from valve.  . Anemia   . Anorexia   . Bacteremia due to Pseudomonas   . Carotid arterial disease ([place])    a. 01/2017 Carotid U/S: RICA <51, LICA <02.  Marland Kitchen CHF (congestive heart failure) ([place])   . Complication of anesthesia    a. Pt reports h/o complication on 9 different occasions - ? hypotension/arrest.  . 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 01/2015 Cath [facility]): 70% LM, p-mLAD 50-60% (Resting FFR 0.75), mRCA 80-90%, ~40 Ost OM & D1-->CABG; b. 01/2016 Staged PCI of LCX x 2 and RCA.  Marland Kitchen ESRD (end stage renal disease) on dialysis ([place])    a. ESRD secondary to acute kidney failure s/p CABG-->PD  . Essential hypertension   . GERD (gastroesophageal reflux disease)   .  Heart murmur   . Hypercholesterolemia   . Myocardial infarction ([place]) 2016  . PVD (peripheral vascular disease) ([place])    a. 01/30/2018 PV Angio: Sev Left Popliteal dzs s/p PTA w/ 68m lutonix DEB w/ mech aspiration of the L popliteal, L AT, and Left tibioperoneal trunck and peroneal artery.  . 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  [facility]: Dr. BMarland KitchenHaithcock: CABG x 3, LIMA to LAD, SVG to RCA, SVG to OM3, EVH  . Sinusitis 2019  . Type II diabetes mellitus with complication (HCC)    CAD    Past Surgical History:  Procedure Laterality Date  . ABDOMINAL HYSTERECTOMY    .  AMPUTATION Right 02/25/2018   Procedure: AMPUTATION BELOW KNEE;  Surgeon: SKatha Cabal MD;  Location: [facility] ORS;  Service: Vascular;  Laterality: Right;  . AMPUTATION Left 03/11/2018   Procedure: AMPUTATION BELOW KNEE;  Surgeon: DAlgernon Huxley MD;  Location: [facility] OR[facility];  Service: Vascular;  Laterality: Left;  . AMPUTATION Bilateral 04/13/2018   Procedure: REVISION OF BILATERAL AMPUTATIONS BELOW KNEE WITH WOUND VAC APPLICATIONS;  Surgeon: EEvaristo Bury MD;  Location: [facility];  Service: Vascular;  Laterality: Bilateral;  . AMPUTATION TOE Left 02/06/2018   Procedure: AMPUTATION GREAT TOE;  Surgeon: FSamara Deist DPM;  Location: [facility] OR[facility];  Service: Podiatry;  Laterality: Left;  . APPLICATION OF WOUND VAC Left 04/13/2018   Procedure: APPLICATION OF WOUND VAC;  Surgeon: EEvaristo Bury MD;  Location: [facility];  Service: Vascular;  Laterality: Left;  . ARTERIOVENOUS GRAFT PLACEMENT  05/2016  . AV FISTULA PLACEMENT Left 05/30/2016   Procedure: ARTERIOVENOUS graft;  Surgeon: JAlgernon Huxley MD;  Location: [facility];  Service: Vascular;  Laterality: Left;  . CAPD INSERTION N/A 10/30/2017   Procedure: LAPAROSCOPIC INSERTION CONTINUOUS AMBULATORY PERITONEAL DIALYSIS  (CAPD) CATHETER;  Surgeon: DAlgernon Huxley MD;  Location: [facility];  Service: Vascular;  Laterality: N/A;  . CARDIAC CATHETERIZATION  01/2015   [facility]: Ost LM 70%, p-m LAD 50-60% (Rest FFR + @ 0.75), mRCA 80-90%, ostD1 40%, pOM1 40%  . CATARACT EXTRACTION W/PHACO Left 01/18/2016   Procedure: CATARACT EXTRACTION PHACO AND INTRAOCULAR LENS PLACEMENT (IOC);  Surgeon: BEulogio Bear MD;  Location: [facility];  Service: Ophthalmology;  Laterality: Left;  [place]1.05 AP% 15.5 CDE 10.16 Fluid Pack Lot # [id number]  . CATARACT EXTRACTION W/PHACO Right 08/01/2016   Procedure: CATARACT EXTRACTION PHACO AND INTRAOCULAR LENS PLACEMENT (IOC);  Surgeon: BEulogio Bear MD;  Location: [facility];  Service: Ophthalmology;  Laterality: Right;  [place]01:00.6 AP%  11.4 CDE 6.93  LOT # [id number]  . COLONOSCOPY    . CORONARY ANGIOPLASTY     SENTS 02/12/16  . CORONARY ARTERY BYPASS GRAFT  03/28/15    [facility]: Dr. HWaldemar Dickens LIMA to LAD, SVG to RCA, SVG to OM3, EVH  . DIALYSIS/PERMA CATHETER INSERTION N/A 02/11/2018   Procedure: DIALYSIS/PERMA CATHETER INSERTION;  Surgeon: DAlgernon Huxley MD;  [place];  Service: Cardiovascular;  Laterality: N/A;  . DIALYSIS/PERMA CATHETER REMOVAL Right 02/04/2018   Procedure: DIALYSIS/PERMA CATHETER REMOVAL;  Surgeon: SKatha Cabal MD;  [place];  Service: Cardiovascular;  Laterality: Right;  . ESOPHAGOGASTRODUODENOSCOPY (EGD) WITH PROPOFOL N/A 10/24/2016   Procedure: ESOPHAGOGASTRODUODENOSCOPY (EGD) WITH PROPOFOL;  Surgeon: WLucilla Lame MD;  Location: [facility];  Service: Endoscopy;  Laterality: N/A;  . EYE SURGERY Bilateral    cataract surgery  . EYE SURGERY     drains for glaucoma  .  INSERTION EXPRESS TUBE SHUNT Right 08/01/2016   Procedure: INSERTION EXPRESS TUBE SHUNT;  Surgeon: Eulogio Bear, MD;  Location: [facility];  Service: Ophthalmology;  Laterality: Right;  . INSERTION OF AHMED VALVE Left 08/15/2016   Procedure: INSERTION OF AHMED VALVE;  Surgeon: Eulogio Bear, MD;  Location: [facility];  Service: Ophthalmology;  Laterality: Left;  . INSERTION OF DIALYSIS CATHETER    . LOWER EXTREMITY ANGIOGRAPHY Right 02/19/2018   Procedure: Lower Extremity Angiography;  Surgeon: Algernon Huxley, MD;  Location: [place] [facility];  Service: Cardiovascular;  Laterality: Right;  . LOWER EXTREMITY ANGIOGRAPHY Left 01/30/2018   Procedure: Lower Extremity Angiography;  Surgeon: Katha Cabal, MD;  Location: [place] [facility];  Service: Cardiovascular;  Laterality: Left;  . REMOVAL OF A DIALYSIS CATHETER N/A 02/25/2018   Procedure: REMOVAL OF A DIALYSIS CATHETER;  Surgeon: Katha Cabal, MD;  Location: [facility];  Service: Vascular;  Laterality: N/A;  . TEMPORARY  DIALYSIS CATHETER N/A 02/06/2018   Procedure: TEMPORARY DIALYSIS CATHETER;  Surgeon: Katha Cabal, MD;  Location: [place] [facility];  Service: Cardiovascular;  Laterality: N/A;  . TRANSTHORACIC ECHOCARDIOGRAM  January 2017    EF 60-65%. GR 2 DD. Mild degenerative mitral valve disease but no prolapse or regurgitation. Mild left atrial dilation. Mild to moderate LVH. Pericardial effusion gone  . UPPER EXTREMITY ANGIOGRAPHY Left 06/27/2016   Procedure: Upper Extremity Angiography;  Surgeon: Algernon Huxley, MD;  Location: [place] [facility];  Service: Cardiovascular;  Laterality: Left;  . UPPER EXTREMITY ANGIOGRAPHY Left 08/22/2016   Procedure: Upper Extremity Angiography;  Surgeon: Algernon Huxley, MD;  Location: [place] [facility];  Service: Cardiovascular;  Laterality: Left;  Marland Kitchen VASCULAR SURGERY    . WOUND DEBRIDEMENT Left 05/08/2018   Procedure: DEBRIDEMENT OF LEFT LEG BKA;  Surgeon: Algernon Huxley, MD;  Location: [facility];  Service: General;  Laterality: Left;     reports that she has never smoked. She has never used smokeless tobacco. She reports that she does not drink alcohol and does not use drugs.  Allergies  Allergen Reactions  . Chlorthalidone Anaphylaxis, Itching and Rash  . Dilaudid [Hydromorphone Hcl] Anaphylaxis    Hypersensitivity. 0.43m caused pt to become unresponsive  . Fentanyl Rash  . Midazolam Rash  . Morphine And Related Anaphylaxis    Pt unresponsive, apneic after 0.5104mdilaudid   . Ace Inhibitors Other (See Comments)    Reaction:  Hyperkalemia, agitation   . Angiotensin Receptor Blockers Other (See Comments)    Reaction:  Hyperkalemia, agitation   . Metoclopramide Other (See Comments)  . Norvasc [Amlodipine] Itching and Rash  . Phenergan [Promethazine Hcl] Anxiety    "antsy, can't sit still"    Family History  Problem Relation Age of Onset  . Diabetes Mellitus II Mother   . Heart failure Mother   . Pancreatic cancer Father    Prior to Admission medications    Medication Sig Start Date End Date Taking? Authorizing Provider  apixaban (ELIQUIS) 2.5 MG TABS tablet Take 1 tablet (2.5 mg total) by mouth 2 (two) times daily. 02/15/18   VaVaughan BastaMD  aspirin EC 81 MG tablet Take 1 tablet (81 mg total) by mouth daily. Patient not taking: No sig reported 10/19/16   PaDustin FlockMD  atorvastatin (LIPITOR) 80 MG tablet Take 80 mg by mouth at bedtime.  05/05/17   [provider]  brimonidine (ALPHAGAN) 0.2 % ophthalmic solution Place 1 drop into both eyes  3 (three) times daily. 11/05/19   Ezekiel Slocumb, DO  carvedilol (COREG) 12.5 MG tablet Take 12.5 mg by mouth 2 (two) times daily. Take in combination with 25 mg for total of 37.5 mg twice daily 02/24/20   [provider]  carvedilol (COREG) 25 MG tablet Take 1 tablet (25 mg total) by mouth 2 (two) times daily. 07/13/18   Stark Jock Jude, MD  fluticasone (FLONASE) 50 MCG/ACT nasal spray Place 2 sprays into both nostrils daily.    [provider]  hydrALAZINE (APRESOLINE) 25 MG tablet Take 3 tablets (75 mg total) by mouth every 8 (eight) hours. 04/19/20   Shelly Coss, MD  insulin aspart (NOVOLOG) 100 UNIT/ML injection Inject 0-6 Units into the skin See admin instructions. Inject under the skin according to blood glucose reading:  <201: 0u 201- 250: 2u 251-300: 4u 301-350: 5u 351-400: 6u Patient not taking: No sig reported    [provider]  insulin glargine (LANTUS) 100 UNIT/ML injection Inject 5 Units into the skin daily in the afternoon.     [provider]  insulin lispro (HUMALOG) 100 UNIT/ML KwikPen Inject 2-6 Units into the skin 3 (three) times daily before meals.    [provider]  ipratropium (ATROVENT) 0.06 % nasal spray Place 2 sprays into both nostrils 2 (two) times daily. 09/05/19   Loletha Grayer, MD  isosorbide mononitrate (IMDUR) 60 MG 24 hr tablet Take 1 tablet (60 mg total) by mouth daily. 04/19/20   Shelly Coss, MD   levETIRAcetam (KEPPRA) 250 MG tablet Three times weekly after dialysis Patient taking differently: Take 250 mg by mouth Every Tuesday,Thursday,and Saturday with dialysis. 05/12/18   Loletha Grayer, MD  levETIRAcetam (KEPPRA) 500 MG tablet Take 500 mg by mouth 2 (two) times daily. 02/24/20   [provider]  losartan (COZAAR) 50 MG tablet Take 1 tablet (50 mg total) by mouth daily. 04/19/20   Shelly Coss, MD  meclizine (ANTIVERT) 25 MG tablet Take 25 mg by mouth every 4 (four) hours as needed for dizziness.    [provider]  mirtazapine (REMERON) 15 MG tablet Take 7.5 mg by mouth daily.     [provider]  Omega-3 1000 MG CAPS Take 2 capsules by mouth daily.     [provider]  pantoprazole (PROTONIX) 40 MG tablet Take 1 tablet (40 mg total) by mouth daily. 06/04/19   [name], MD  Polyvinyl Alcohol-Povidone PF (REFRESH) 1.4-0.6 % SOLN Place 1 drop into both eyes 2 (two) times a day.     [provider]  rOPINIRole (REQUIP) 0.25 MG tablet Take 0.25 mg by mouth at bedtime.    [provider]  senna-docusate (SENOKOT-S) 8.6-50 MG tablet Take 1 tablet by mouth 2 (two) times daily. 04/19/20   Shelly Coss, MD  sevelamer carbonate (RENVELA) 800 MG tablet 2 Tablet(s) By Mouth 3 Times Daily    [provider]  thiamine 100 MG tablet Take 1 tablet (100 mg total) by mouth daily. 04/19/20   Shelly Coss, MD  timolol (TIMOPTIC) 0.5 % ophthalmic solution Place 1 drop into both eyes 3 (three) times daily. 03/18/18   Bettey Costa, MD  triamcinolone cream (KENALOG) 0.1 % Apply topically. 11/01/19   [provider]  vitamin B-12 (CYANOCOBALAMIN) 1000 MCG tablet Take 1 tablet (1,000 mcg total) by mouth daily. 02/15/18   Vaughan Basta, MD    Physical Exam: Vitals:   05/13/20 1914 05/13/20 1930 05/13/20 2000 05/13/20 2030  BP:  (!) 123/54 Marland Kitchen)  175/62 (!) 158/64  Pulse:  (!) 59 (!) 59 (!) 55  Resp:  10 (!) 9 (!) 0   SpO2:  93% 100% 90%  Weight: 60 kg        Vitals:   05/13/20 1914 05/13/20 1930 05/13/20 2000 05/13/20 2030  BP:  (!) 123/54 (!) 175/62 (!) 158/64  Pulse:  (!) 59 (!) 59 (!) 55  Resp:  10 (!) 9 (!) 0  SpO2:  93% 100% 90%  Weight: 60 kg     Constitutional: NAD, calm, comfortable Eyes: PERRL, lids and conjunctivae normal ENMT: Mucous membranes are moist. Posterior pharynx clear of any exudate or lesions.Normal dentition.  Neck: normal, supple, no masses, no thyromegaly Respiratory: occasional rhochi, no wheezing, + crackles. Normal respiratory effort. No accessory muscle use.  Cardiovascular: Regular rate and rhythm, no murmurs / rubs / gallops. No extremity edema.extremites warm Abdomen: no tenderness, no masses palpated. No hepatosplenomegaly. Bowel sounds positive.  Musculoskeletal: no clubbing / cyanosis. B/l bka. Good ROM, no contractures. Normal muscle tone.  Skin: no rashes, lesions, ulcers. No induration Neurologic: CN 2-12 grossly intact. Sensation intact, Strength 5/5 in all 4.  Psychiatric: Normal judgment and insight. Alert and oriented x 3. Normal mood.    Labs on Admission: I have personally reviewed following labs and imaging studies  CBC: Recent Labs  Lab 05/10/20 0944 05/13/20 1803  WBC 4.5 4.6  NEUTROABS 3.3 3.2  HGB 9.5* 8.4*  HCT 31.5* 28.5*  MCV 100.6* 101.8*  PLT 84* 71*   Basic Metabolic Panel: Recent Labs  Lab 05/10/20 0944 05/13/20 1803  NA 139 136  K 6.5* 6.2*  CL 98 96*  CO2 24 25  GLUCOSE 51* 155*  BUN 88* 75*  CREATININE 10.38* 10.80*  CALCIUM 8.7* 7.9*   GFR: Estimated Creatinine Clearance: 3.1 mL/min (A) (by C-G formula based on SCr of 10.8 mg/dL (H)). Liver Function Tests: Recent Labs  Lab 05/10/20 0944 05/13/20 1803  AST 21 24  ALT 27 19  ALKPHOS 88 75  BILITOT 0.8 0.8  PROT 6.9 6.8  ALBUMIN 3.5 3.2*   No results for input(s): LIPASE, AMYLASE in the last 168 hours. No results for input(s): AMMONIA in the last 168  hours. Coagulation Profile: Recent Labs  Lab 05/10/20 0944  INR 1.7*   Cardiac Enzymes: No results for input(s): CKTOTAL, CKMB, CKMBINDEX, TROPONINI in the last 168 hours. BNP (last 3 results) No results for input(s): PROBNP in the last 8760 hours. HbA1C: No results for input(s): HGBA1C in the last 72 hours. CBG: No results for input(s): GLUCAP in the last 168 hours. Lipid Profile: No results for input(s): CHOL, HDL, LDLCALC, TRIG, CHOLHDL, LDLDIRECT in the last 72 hours. Thyroid Function Tests: No results for input(s): TSH, T4TOTAL, FREET4, T3FREE, THYROIDAB in the last 72 hours. Anemia Panel: No results for input(s): VITAMINB12, FOLATE, FERRITIN, TIBC, IRON, RETICCTPCT in the last 72 hours. Urine analysis:    Component Value Date/Time   COLORURINE RED (A) 10/17/2016 1643   APPEARANCEUR TURBID (A) 10/17/2016 1643   LABSPEC 1.014 10/17/2016 1643   PHURINE  10/17/2016 1643    TEST NOT REPORTED DUE TO COLOR INTERFERENCE OF URINE PIGMENT   GLUCOSEU (A) 10/17/2016 1643    TEST NOT REPORTED DUE TO COLOR INTERFERENCE OF URINE PIGMENT   HGBUR (A) 10/17/2016 1643    TEST NOT REPORTED DUE TO COLOR INTERFERENCE OF URINE PIGMENT   BILIRUBINUR (A) 10/17/2016 1643    TEST NOT REPORTED DUE TO COLOR INTERFERENCE OF URINE  PIGMENT   KETONESUR (A) 10/17/2016 1643    TEST NOT REPORTED DUE TO COLOR INTERFERENCE OF URINE PIGMENT   PROTEINUR (A) 10/17/2016 1643    TEST NOT REPORTED DUE TO COLOR INTERFERENCE OF URINE PIGMENT   NITRITE (A) 10/17/2016 1643    TEST NOT REPORTED DUE TO COLOR INTERFERENCE OF URINE PIGMENT   LEUKOCYTESUR (A) 10/17/2016 1643    TEST NOT REPORTED DUE TO COLOR INTERFERENCE OF URINE PIGMENT    Radiological Exams on Admission: DG Chest Portable 1 View  Result Date: 05/13/2020 CLINICAL DATA:  70 year old female status post cardiac arrest. EXAM: PORTABLE CHEST 1 VIEW COMPARISON:  Chest radiograph dated 05/10/2020. FINDINGS: Dialysis catheter in similar position. There is  cardiomegaly with vascular congestion and small bilateral pleural effusions. No pneumothorax. Median sternotomy wires and CABG vascular clips. Atherosclerotic calcification of the aorta. No acute osseous pathology. IMPRESSION: Cardiomegaly with vascular congestion and small bilateral pleural effusions. Electronically Signed   By: Anner Crete M.D.   On: 05/13/2020 19:12    EKG: Independently reviewed. See above  Assessment/Plan V-fib arrest -related to metabolic derangements /fluid overload with hypoxemia  related to noncompliance with HD x 1 week -s/p arrest back to baseline mental status, no requirement for pressors, Minimal O2 requirement, EKG no hyperacute findings -s/p calcium gluconate x 2 and bicarb x 1 -Urgent HD planned  -renal consulted  -f/u on further renal recs in am  -monitor electrolytes closely  -hr now 79's  Resume bb as tolerated  -echo in am - cardiology consult   Hx of CAD s/p CABG +stenting -elevated CE presumed due to arrest  -resume cardiac regimen as blood pressure and hr can tolerate  (asa 81,imdur 30,statin80) -await final cardiac recs   Hyperkalemia  -s/p bicarb /calcium gluconate x 2  -ekg no signs of hyperacute t-waves  -urgent HD planned  -repeat labs pending   Hypocalemia -s/p calcium gluconate   Acute CHFrEF exacerbation  -due to noncompliance with HD -HD planned  -place on CHF protocol  -fluid restriction  -fluid management with HD -followed by [facility] Heart failure clinic (last seen 09/14/19)   ESRD on HD MWF -continue with renal medications  Anemia  -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/nutritional MCV101 -continue to monitor labs,consider epo level, renal to assist -no complaints of blood loss  A fib -continue carvedilol 25 mg BID as able -continue  apixaban 2.5 mg BID  HTN -continue with  -carvedilol as tolerated  DMII -place on fs/iss -resume low dose lantus in am as patient tolerates  GERD -ppi   Hx of Seizures -Keppra 500 mg BID + 250  mg TID after dialysis  Anxiety Depression  -remeron can be resumed in am   HLD -continue statin   DVT prophylaxis: eliquis  Code Status:FULL Family Communication: none at beside  Disposition Plan:patient  expected to be admitted greater than 2 midnights Consults called:  Renal [place], Cardiology [place] Admission status: inpt step down   Clance Boll MD [facility]  If 7PM-7AM, please contact night-coverage [email or web address] Password [facility]  05/13/2020, 9:01 PM

## 2020-05-13 NOTE — ED Triage Notes (Signed)
Pt lives at [facility] and staff called out due to sick call as pt had some syncopal episodes and having missed 3-4 HD sessions.  [place] and nursing staff in room on pt arrival and pt placed on zoll pads, crash cart at bedside.

## 2020-05-13 NOTE — ED Notes (Signed)
SNF called to check on pt.  They state that pt refused HD last Friday as well as this Monday and this Wednesday she was seen at the hospital and discharged to HD so she had a treatment on Wednesday (only because she was d/c to HD per staff) and she again refused yesterday.

## 2020-05-14 DIAGNOSIS — D61818 Other pancytopenia: Secondary | ICD-10-CM

## 2020-05-14 DIAGNOSIS — E875 Hyperkalemia: Principal | ICD-10-CM

## 2020-05-14 DIAGNOSIS — Z992 Dependence on renal dialysis: Secondary | ICD-10-CM

## 2020-05-14 DIAGNOSIS — N186 End stage renal disease: Secondary | ICD-10-CM

## 2020-05-14 DIAGNOSIS — Z862 Personal history of diseases of the blood and blood-forming organs and certain disorders involving the immune mechanism: Secondary | ICD-10-CM

## 2020-05-14 DIAGNOSIS — N189 Chronic kidney disease, unspecified: Secondary | ICD-10-CM

## 2020-05-14 LAB — CBC
HCT: 26.7 % — ABNORMAL LOW (ref 36.0–46.0)
HCT: 27 % — ABNORMAL LOW (ref 36.0–46.0)
Hemoglobin: 8 g/dL — ABNORMAL LOW (ref 12.0–15.0)
Hemoglobin: 8.2 g/dL — ABNORMAL LOW (ref 12.0–15.0)
MCH: 30 pg (ref 26.0–34.0)
MCH: 30.5 pg (ref 26.0–34.0)
MCHC: 30 g/dL (ref 30.0–36.0)
MCHC: 30.4 g/dL (ref 30.0–36.0)
MCV: 100 fL (ref 80.0–100.0)
MCV: 100.4 fL — ABNORMAL HIGH (ref 80.0–100.0)
Platelets: 71 10*3/uL — ABNORMAL LOW (ref 150–400)
Platelets: 74 10*3/uL — ABNORMAL LOW (ref 150–400)
RBC: 2.67 MIL/uL — ABNORMAL LOW (ref 3.87–5.11)
RBC: 2.69 MIL/uL — ABNORMAL LOW (ref 3.87–5.11)
RDW: 18.5 % — ABNORMAL HIGH (ref 11.5–15.5)
RDW: 18.6 % — ABNORMAL HIGH (ref 11.5–15.5)
WBC: 3.7 10*3/uL — ABNORMAL LOW (ref 4.0–10.5)
WBC: 3.7 10*3/uL — ABNORMAL LOW (ref 4.0–10.5)
nRBC: 0 % (ref 0.0–0.2)
nRBC: 0 % (ref 0.0–0.2)

## 2020-05-14 LAB — BASIC METABOLIC PANEL
Anion gap: 18 — ABNORMAL HIGH (ref 5–15)
BUN: 83 mg/dL — ABNORMAL HIGH (ref 8–23)
CO2: 27 mmol/L (ref 22–32)
Calcium: 8.1 mg/dL — ABNORMAL LOW (ref 8.9–10.3)
Chloride: 97 mmol/L — ABNORMAL LOW (ref 98–111)
Creatinine, Ser: 11.27 mg/dL — ABNORMAL HIGH (ref 0.44–1.00)
GFR, Estimated: 3 mL/min — ABNORMAL LOW (ref >60)
Glucose, Bld: 88 mg/dL (ref 70–99)
Potassium: 5.8 mmol/L — ABNORMAL HIGH (ref 3.5–5.1)
Sodium: 142 mmol/L (ref 135–145)

## 2020-05-14 LAB — COMPREHENSIVE METABOLIC PANEL
ALT: 17 U/L (ref 0–44)
AST: 20 U/L (ref 15–41)
Albumin: 3.2 g/dL — ABNORMAL LOW (ref 3.5–5.0)
Alkaline Phosphatase: 71 U/L (ref 38–126)
Anion gap: 16 — ABNORMAL HIGH (ref 5–15)
BUN: 79 mg/dL — ABNORMAL HIGH (ref 8–23)
CO2: 27 mmol/L (ref 22–32)
Calcium: 8.3 mg/dL — ABNORMAL LOW (ref 8.9–10.3)
Chloride: 97 mmol/L — ABNORMAL LOW (ref 98–111)
Creatinine, Ser: 10.99 mg/dL — ABNORMAL HIGH (ref 0.44–1.00)
GFR, Estimated: 3 mL/min — ABNORMAL LOW (ref >60)
Glucose, Bld: 140 mg/dL — ABNORMAL HIGH (ref 70–99)
Potassium: 6.4 mmol/L (ref 3.5–5.1)
Sodium: 140 mmol/L (ref 135–145)
Total Bilirubin: 0.7 mg/dL (ref 0.3–1.2)
Total Protein: 6.6 g/dL (ref 6.5–8.1)

## 2020-05-14 LAB — RENAL FUNCTION PANEL
Albumin: 3 g/dL — ABNORMAL LOW (ref 3.5–5.0)
Anion gap: 16 — ABNORMAL HIGH (ref 5–15)
BUN: 82 mg/dL — ABNORMAL HIGH (ref 8–23)
CO2: 26 mmol/L (ref 22–32)
Calcium: 7.9 mg/dL — ABNORMAL LOW (ref 8.9–10.3)
Chloride: 99 mmol/L (ref 98–111)
Creatinine, Ser: 11.67 mg/dL — ABNORMAL HIGH (ref 0.44–1.00)
GFR, Estimated: 3 mL/min — ABNORMAL LOW (ref >60)
Glucose, Bld: 87 mg/dL (ref 70–99)
Phosphorus: 5.9 mg/dL — ABNORMAL HIGH (ref 2.5–4.6)
Potassium: 5.3 mmol/L — ABNORMAL HIGH (ref 3.5–5.1)
Sodium: 141 mmol/L (ref 135–145)

## 2020-05-14 LAB — VITAMIN B12: Vitamin B-12: 2095 pg/mL — ABNORMAL HIGH (ref 180–914)

## 2020-05-14 LAB — CBG MONITORING, ED
Glucose-Capillary: 75 mg/dL (ref 70–99)
Glucose-Capillary: 91 mg/dL (ref 70–99)
Glucose-Capillary: 88 mg/dL (ref 70–99)

## 2020-05-14 LAB — POTASSIUM
Potassium: 6 mmol/L — ABNORMAL HIGH (ref 3.5–5.1)
Potassium: 4 mmol/L (ref 3.5–5.1)
Potassium: 4 mmol/L (ref 3.5–5.1)

## 2020-05-14 LAB — CK: Total CK: 153 U/L (ref 38–234)

## 2020-05-14 LAB — FOLATE: Folate: 7.3 ng/mL (ref >5.9)

## 2020-05-14 LAB — TROPONIN I (HIGH SENSITIVITY)
Troponin I (High Sensitivity): 129 ng/L (ref <18)
Troponin I (High Sensitivity): 120 ng/L (ref <18)

## 2020-05-14 MED ORDER — SEVELAMER CARBONATE 800 MG PO TABS
1600.0000 mg | ORAL_TABLET | Freq: Three times a day (TID) | ORAL | Status: DC
  Administered 2020-05-14 – 2020-05-16 (×6): 1600 mg via ORAL
  Filled 2020-05-14 (×10): qty 2

## 2020-05-14 MED ORDER — ISOSORBIDE MONONITRATE ER 60 MG PO TB24
60.0000 mg | ORAL_TABLET | Freq: Every day | ORAL | Status: DC
  Administered 2020-05-14 – 2020-05-16 (×3): 60 mg via ORAL
  Filled 2020-05-14 (×3): qty 1

## 2020-05-14 MED ORDER — BRIMONIDINE TARTRATE 0.2 % OP SOLN
1.0000 [drp] | Freq: Three times a day (TID) | OPHTHALMIC | Status: DC
  Administered 2020-05-14 – 2020-05-16 (×6): 1 [drp] via OPHTHALMIC
  Filled 2020-05-14: qty 5

## 2020-05-14 MED ORDER — ATORVASTATIN CALCIUM 80 MG PO TABS
80.0000 mg | ORAL_TABLET | Freq: Every day | ORAL | Status: DC
  Administered 2020-05-14 – 2020-05-15 (×2): 80 mg via ORAL
  Filled 2020-05-14: qty 1
  Filled 2020-05-14: qty 4

## 2020-05-14 MED ORDER — CHLORHEXIDINE GLUCONATE CLOTH 2 % EX PADS
6.0000 | MEDICATED_PAD | Freq: Every day | CUTANEOUS | Status: DC
  Filled 2020-05-14 (×3): qty 6

## 2020-05-14 MED ORDER — LEVETIRACETAM 250 MG PO TABS
250.0000 mg | ORAL_TABLET | ORAL | Status: DC
  Administered 2020-05-15: 250 mg via ORAL
  Filled 2020-05-14: qty 1

## 2020-05-14 MED ORDER — SODIUM ZIRCONIUM CYCLOSILICATE 5 G PO PACK
5.0000 g | PACK | Freq: Every day | ORAL | Status: DC
  Filled 2020-05-14: qty 1

## 2020-05-14 MED ORDER — ASPIRIN EC 81 MG PO TBEC
81.0000 mg | DELAYED_RELEASE_TABLET | Freq: Every day | ORAL | Status: DC
  Administered 2020-05-14 – 2020-05-16 (×3): 81 mg via ORAL
  Filled 2020-05-14 (×3): qty 1

## 2020-05-14 MED ORDER — FLUTICASONE PROPIONATE 50 MCG/ACT NA SUSP
2.0000 | Freq: Every day | NASAL | Status: DC
  Administered 2020-05-15: 2 via NASAL
  Filled 2020-05-14: qty 16

## 2020-05-14 MED ORDER — MIRTAZAPINE 15 MG PO TABS
7.5000 mg | ORAL_TABLET | Freq: Every day | ORAL | Status: DC
  Administered 2020-05-14 – 2020-05-16 (×3): 7.5 mg via ORAL
  Filled 2020-05-14 (×3): qty 1

## 2020-05-14 MED ORDER — APIXABAN 2.5 MG PO TABS
2.5000 mg | ORAL_TABLET | Freq: Two times a day (BID) | ORAL | Status: DC
  Administered 2020-05-14 – 2020-05-16 (×4): 2.5 mg via ORAL
  Filled 2020-05-14 (×8): qty 1

## 2020-05-14 NOTE — ED Notes (Signed)
Pt resting eyes closed; RR even and unlabored on RA -continuous cardiac and pulse ox maintained.  Pt aroused slowly with tactile stimulation but remains lethargic and grimaces when asked questions.  Will continue to monitor for acute changes and maintain plan of care

## 2020-05-14 NOTE — ED Notes (Signed)
EJ accidentally removed by pt. Unsuccessful attempt by this RN to draw potassium.

## 2020-05-14 NOTE — ED Notes (Signed)
Updated dialysis nurse Stephanie Ellis regarding pt

## 2020-05-14 NOTE — ED Notes (Signed)
Serial EKGs completed 13 min apart and repeat trop, K+ and CK levels sent to lab - results pending.  Able to arouse pt with repositioning- pt awake and alert; able to respond to closed ended questions appropriately and able to follow simple commands -- pt tolerated PO Lokelma after ekg's and labs were collected.  Pt remains awake and alert but calm and quiet - denies any immediate needs questions concerns.  Will monitor for acute changes and maintain plan of care

## 2020-05-14 NOTE — ED Notes (Addendum)
Pt back from dialysis, NAD noted

## 2020-05-14 NOTE — Consult Note (Signed)
[facility] Consultation Note  Patient ID: Stephanie Ellis, MRN: [id number], DOB/AGE: 70/15/1952 70 y.o. Admit date: 05/13/2020   Date of Consult: 05/14/2020 Primary Physician: Pcp, No Primary Cardiologist: None  Chief Complaint:  Chief Complaint  Patient presents with  . Cardiac Arrest    Cardiac arrest with ems pta, 2-3 minutes of CPR (compressions and bagged pt) before ROSC and she regained consciousness   Reason for Consult: HPI    Cardiac Arrest     Additional comments: Cardiac arrest with ems pta, 2-3 minutes of CPR (compressions and bagged pt) before ROSC and she regained consciousness       Last edited by Flueckiger, Amedeo Kinsman, RN on 05/13/2020  5:55 PM. (History)       HPI: 70 y.o. female with known coronary artery disease status post apparent previous coronary artery bypass graft with peripheral vascular disease with bilateral BKA hypertension hyperlipidemia and paroxysmal nonvalvular atrial fibrillation on Eliquis and appropriate medication management.  The patient has dialysis but is very noncompliant with her dialysis regimen and has had multiple episodes where she has had severe pulmonary edema and electrolyte abnormalities with somnolence.  The patient apparently missed dialysis and was brought to the hospital with significant concerns for heart failure and hypoxia.  During that hospital transfer the patient had apparent V. fib arrest with 4 minutes of CPR for which the patient has recovered fairly well.  Since then she has been receiving oxygen and has had no evidence of further rhythm disturbances with an EKG currently showing normal sinus rhythm left atrial enlargement left anterior fascicular block.  She does have troponins consistent with her CPR and chronic kidney disease rather than acute coronary syndrome at 120/145/129.  Hemoglobin is eight likely secondary to anemia from chronic disease.  There is been no evidence of anginal symptoms or congestive heart  failure at this time although there is some congestion by chest x-ray consistent with volume overload from missing her dialysis.  Past Medical History:  Diagnosis Date  . (HFpEF) heart failure with preserved ejection fraction ([place])    a. 01/2018 Echo: EF 50-55%, no rwma, Gr1 DD, triv AI, mild MR. Midly dil LA/RV, mod reduced RV fxn. Irreg thickening of TV w/ mobile echodensity that appears to arise from valve.  . Anemia   . Anorexia   . Bacteremia due to Pseudomonas   . Carotid arterial disease ([place])    a. 01/2017 Carotid U/S: RICA <24, LICA <23.  Marland Kitchen CHF (congestive heart failure) ([place])   . Complication of anesthesia    a. Pt reports h/o complication on 9 different occasions - ? hypotension/arrest.  . 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 01/2015 Cath [facility]): 70% LM, p-mLAD 50-60% (Resting FFR 0.75), mRCA 80-90%, ~40 Ost OM & D1-->CABG; b. 01/2016 Staged PCI of LCX x 2 and RCA.  Marland Kitchen ESRD (end stage renal disease) on dialysis ([place])    a. ESRD secondary to acute kidney failure s/p CABG-->PD  . Essential hypertension   . GERD (gastroesophageal reflux disease)   . Heart murmur   . Hypercholesterolemia   . Myocardial infarction ([place]) 2016  . PVD (peripheral vascular disease) ([place])    a. 01/30/2018 PV Angio: Sev Left Popliteal dzs s/p PTA w/ 22m lutonix DEB w/ mech aspiration of the L popliteal, L AT, and Left tibioperoneal trunck and peroneal artery.  . 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  [facility]: Dr. BMarland KitchenHaithcock: CABG x 3, LIMA  to LAD, SVG to RCA, SVG to OM3, EVH  . Sinusitis 2019  . Type II diabetes mellitus with complication [facility])    CAD      Surgical History:  Past Surgical History:  Procedure Laterality Date  . ABDOMINAL HYSTERECTOMY    . AMPUTATION Right 02/25/2018   Procedure: AMPUTATION BELOW KNEE;  Surgeon: Katha Cabal, MD;  Location: [facility] ORS;  Service: Vascular;  Laterality: Right;  . AMPUTATION Left 03/11/2018   Procedure: AMPUTATION  BELOW KNEE;  Surgeon: Algernon Huxley, MD;  Location: [facility] ORS;  Service: Vascular;  Laterality: Left;  . AMPUTATION Bilateral 04/13/2018   Procedure: REVISION OF BILATERAL AMPUTATIONS BELOW KNEE WITH WOUND VAC APPLICATIONS;  Surgeon: Evaristo Bury, MD;  Location: [facility];  Service: Vascular;  Laterality: Bilateral;  . AMPUTATION TOE Left 02/06/2018   Procedure: AMPUTATION GREAT TOE;  Surgeon: Samara Deist, DPM;  Location: [facility] OR[facility];  Service: Podiatry;  Laterality: Left;  . APPLICATION OF WOUND VAC Left 04/13/2018   Procedure: APPLICATION OF WOUND VAC;  Surgeon: Evaristo Bury, MD;  Location: [facility];  Service: Vascular;  Laterality: Left;  . ARTERIOVENOUS GRAFT PLACEMENT  05/2016  . AV FISTULA PLACEMENT Left 05/30/2016   Procedure: ARTERIOVENOUS graft;  Surgeon: Algernon Huxley, MD;  Location: [facility];  Service: Vascular;  Laterality: Left;  . CAPD INSERTION N/A 10/30/2017   Procedure: LAPAROSCOPIC INSERTION CONTINUOUS AMBULATORY PERITONEAL DIALYSIS  (CAPD) CATHETER;  Surgeon: Algernon Huxley, MD;  Location: [facility];  Service: Vascular;  Laterality: N/A;  . CARDIAC CATHETERIZATION  01/2015   [facility]: Ost LM 70%, p-m LAD 50-60% (Rest FFR + @ 0.75), mRCA 80-90%, ostD1 40%, pOM1 40%  . CATARACT EXTRACTION W/PHACO Left 01/18/2016   Procedure: CATARACT EXTRACTION PHACO AND INTRAOCULAR LENS PLACEMENT (IOC);  Surgeon: Eulogio Bear, MD;  Location: [facility];  Service: Ophthalmology;  Laterality: Left;  [place] 1.05 AP% 15.5 CDE 10.16 Fluid Pack Lot # [id number]  . CATARACT EXTRACTION W/PHACO Right 08/01/2016   Procedure: CATARACT EXTRACTION PHACO AND INTRAOCULAR LENS PLACEMENT (IOC);  Surgeon: Eulogio Bear, MD;  Location: [facility];  Service: Ophthalmology;  Laterality: Right;  [place] 01:00.6 AP% 11.4 CDE 6.93  LOT # [id number]  . COLONOSCOPY    . CORONARY ANGIOPLASTY     SENTS 02/12/16  . CORONARY ARTERY BYPASS GRAFT  03/28/15    [facility]: Dr. Waldemar Dickens: LIMA to LAD, SVG to RCA, SVG to OM3, EVH  .  DIALYSIS/PERMA CATHETER INSERTION N/A 02/11/2018   Procedure: DIALYSIS/PERMA CATHETER INSERTION;  Surgeon: Algernon Huxley, MD;  Location: [place] [facility];  Service: Cardiovascular;  Laterality: N/A;  . DIALYSIS/PERMA CATHETER REMOVAL Right 02/04/2018   Procedure: DIALYSIS/PERMA CATHETER REMOVAL;  Surgeon: Katha Cabal, MD;  Location: [place] [facility];  Service: Cardiovascular;  Laterality: Right;  . ESOPHAGOGASTRODUODENOSCOPY (EGD) WITH PROPOFOL N/A 10/24/2016   Procedure: ESOPHAGOGASTRODUODENOSCOPY (EGD) WITH PROPOFOL;  Surgeon: [name], MD;  Location: [facility];  Service: Endoscopy;  Laterality: N/A;  . EYE SURGERY Bilateral    cataract surgery  . EYE SURGERY     drains for glaucoma  . INSERTION EXPRESS TUBE SHUNT Right 08/01/2016   Procedure: INSERTION EXPRESS TUBE SHUNT;  Surgeon: Eulogio Bear, MD;  Location: [facility];  Service: Ophthalmology;  Laterality: Right;  . INSERTION OF AHMED VALVE Left 08/15/2016   Procedure: INSERTION OF AHMED VALVE;  Surgeon: Eulogio Bear, MD;  Location: [facility];  Service: Ophthalmology;  Laterality: Left;  . INSERTION OF DIALYSIS  CATHETER    . LOWER EXTREMITY ANGIOGRAPHY Right 02/19/2018   Procedure: Lower Extremity Angiography;  Surgeon: Algernon Huxley, MD;  Location: [place] [facility];  Service: Cardiovascular;  Laterality: Right;  . LOWER EXTREMITY ANGIOGRAPHY Left 01/30/2018   Procedure: Lower Extremity Angiography;  Surgeon: Katha Cabal, MD;  Location: [place] [facility];  Service: Cardiovascular;  Laterality: Left;  . REMOVAL OF A DIALYSIS CATHETER N/A 02/25/2018   Procedure: REMOVAL OF A DIALYSIS CATHETER;  Surgeon: Katha Cabal, MD;  Location: [facility];  Service: Vascular;  Laterality: N/A;  . TEMPORARY DIALYSIS CATHETER N/A 02/06/2018   Procedure: TEMPORARY DIALYSIS CATHETER;  Surgeon: Katha Cabal, MD;  Location: [place] [facility];  Service: Cardiovascular;  Laterality: N/A;  . TRANSTHORACIC  ECHOCARDIOGRAM  January 2017    EF 60-65%. GR 2 DD. Mild degenerative mitral valve disease but no prolapse or regurgitation. Mild left atrial dilation. Mild to moderate LVH. Pericardial effusion gone  . UPPER EXTREMITY ANGIOGRAPHY Left 06/27/2016   Procedure: Upper Extremity Angiography;  Surgeon: Algernon Huxley, MD;  Location: [place] [facility];  Service: Cardiovascular;  Laterality: Left;  . UPPER EXTREMITY ANGIOGRAPHY Left 08/22/2016   Procedure: Upper Extremity Angiography;  Surgeon: Algernon Huxley, MD;  Location: [place] [facility];  Service: Cardiovascular;  Laterality: Left;  Marland Kitchen VASCULAR SURGERY    . WOUND DEBRIDEMENT Left 05/08/2018   Procedure: DEBRIDEMENT OF LEFT LEG BKA;  Surgeon: Algernon Huxley, MD;  Location: [facility];  Service: General;  Laterality: Left;     Home Meds: Prior to Admission medications   Medication Sig Start Date End Date Taking? Authorizing Provider  amoxicillin-clavulanate (AUGMENTIN) 875-125 MG tablet Take 1 tablet by mouth 2 (two) times daily. 05/09/20  Yes [provider]  apixaban (ELIQUIS) 2.5 MG TABS tablet Take 1 tablet (2.5 mg total) by mouth 2 (two) times daily. 02/15/18  Yes Vaughan Basta, MD  aspirin EC 81 MG tablet Take 1 tablet (81 mg total) by mouth daily. 10/19/16  Yes Dustin Flock, MD  atorvastatin (LIPITOR) 80 MG tablet Take 80 mg by mouth at bedtime.  05/05/17  Yes [provider]  brimonidine (ALPHAGAN) 0.2 % ophthalmic solution Place 1 drop into both eyes 3 (three) times daily. 11/05/19  Yes Nicole Kindred A, DO  carvedilol (COREG) 12.5 MG tablet Take 12.5 mg by mouth 2 (two) times daily. Take in combination with 25 mg for total of 37.5 mg twice daily 02/24/20  Yes [provider]  carvedilol (COREG) 25 MG tablet Take 1 tablet (25 mg total) by mouth 2 (two) times daily. 07/13/18  Yes Ojie, Jude, MD  fluticasone (FLONASE) 50 MCG/ACT nasal spray Place 2 sprays into both nostrils daily.   Yes [provider]   hydrALAZINE (APRESOLINE) 25 MG tablet Take 3 tablets (75 mg total) by mouth every 8 (eight) hours. 04/19/20  Yes Shelly Coss, MD  insulin aspart (NOVOLOG) 100 UNIT/ML injection Inject 0-6 Units into the skin See admin instructions. Inject under the skin according to blood glucose reading:  <201: 0u 201- 250: 2u 251-300: 4u 301-350: 5u 351-400: 6u   Yes [provider]  insulin lispro (HUMALOG) 100 UNIT/ML KwikPen Inject 2-6 Units into the skin 3 (three) times daily before meals.   Yes [provider]  ipratropium (ATROVENT) 0.06 % nasal spray Place 2 sprays into both nostrils 2 (two) times daily. 09/05/19  Yes Wieting, Richard, MD  isosorbide mononitrate (IMDUR) 60 MG 24 hr tablet Take  1 tablet (60 mg total) by mouth daily. 04/19/20  Yes Shelly Coss, MD  levETIRAcetam (KEPPRA) 250 MG tablet Three times weekly after dialysis Patient taking differently: Take 250 mg by mouth every Monday, Wednesday, and Friday. 05/12/18  Yes Wieting, Richard, MD  levETIRAcetam (KEPPRA) 500 MG tablet Take 500 mg by mouth 2 (two) times daily. 02/24/20  Yes [provider]  losartan (COZAAR) 50 MG tablet Take 1 tablet (50 mg total) by mouth daily. 04/19/20  Yes Shelly Coss, MD  meclizine (ANTIVERT) 25 MG tablet Take 25 mg by mouth every 4 (four) hours as needed for dizziness.   Yes [provider]  mirtazapine (REMERON) 15 MG tablet Take 7.5 mg by mouth daily.    Yes [provider]  Omega-3 1000 MG CAPS Take 2 capsules by mouth daily.    Yes [provider]  pantoprazole (PROTONIX) 40 MG tablet Take 1 tablet (40 mg total) by mouth daily. 06/04/19  Yes [name], MD  Polyvinyl Alcohol-Povidone PF (REFRESH) 1.4-0.6 % SOLN Place 1 drop into both eyes 2 (two) times a day.    Yes [provider]  rOPINIRole (REQUIP) 0.25 MG tablet Take 0.25 mg by mouth at bedtime.   Yes [provider]  senna-docusate (SENOKOT-S) 8.6-50 MG tablet Take 1  tablet by mouth 2 (two) times daily. 04/19/20  Yes Shelly Coss, MD  sevelamer carbonate (RENVELA) 800 MG tablet 2 Tablet(s) By Mouth 3 Times Daily   Yes [provider]  thiamine 100 MG tablet Take 1 tablet (100 mg total) by mouth daily. 04/19/20  Yes Shelly Coss, MD  timolol (TIMOPTIC) 0.5 % ophthalmic solution Place 1 drop into both eyes 3 (three) times daily. 03/18/18  Yes Mody, Ulice Bold, MD  triamcinolone cream (KENALOG) 0.1 % Apply 1 application topically daily as needed (dry skin). 11/01/19  Yes [provider]  vitamin B-12 (CYANOCOBALAMIN) 1000 MCG tablet Take 1 tablet (1,000 mcg total) by mouth daily. 02/15/18  Yes Vaughan Basta, MD    Inpatient Medications:  . carvedilol  25 mg Oral BID WC  . Chlorhexidine Gluconate Cloth  6 each Topical Q0600  . insulin aspart  0-6 Units Subcutaneous TID WC  . levETIRAcetam  500 mg Oral BID  . pantoprazole  40 mg Oral Daily  . sodium zirconium cyclosilicate  5 g Oral Daily     Allergies:  Allergies  Allergen Reactions  . Chlorthalidone Anaphylaxis, Itching and Rash  . Dilaudid [Hydromorphone Hcl] Anaphylaxis    Hypersensitivity. 0.32m caused pt to become unresponsive  . Fentanyl Rash  . Midazolam Rash  . Morphine And Related Anaphylaxis    Pt unresponsive, apneic after 0.538mdilaudid   . Ace Inhibitors Other (See Comments)    Reaction:  Hyperkalemia, agitation   . Angiotensin Receptor Blockers Other (See Comments)    Reaction:  Hyperkalemia, agitation   . Metoclopramide Other (See Comments)  . Norvasc [Amlodipine] Itching and Rash  . Phenergan [Promethazine Hcl] Anxiety    "antsy, can't sit still"    Social History   Socioeconomic History  . Marital status: Married    Spouse name: Not on file  . Number of children: Not on file  . Years of education: Not on file  . Highest education level: Not on file  Occupational History  . Not on file  Tobacco Use  . Smoking status: Never Smoker  .  Smokeless tobacco: Never Used  Vaping Use  . Vaping Use: Never used  Substance and Sexual Activity  .  Alcohol use: No  . Drug use: No  . Sexual activity: Never  Other Topics Concern  . Not on file  Social History Narrative   Lives in [place] with family.   Social Determinants of Health   Financial Resource Strain: Not on file  Food Insecurity: Not on file  Transportation Needs: Not on file  Physical Activity: Not on file  Stress: Not on file  Social Connections: Not on file  Intimate Partner Violence: Not on file     Family History  Problem Relation Age of Onset  . Diabetes Mellitus II Mother   . Heart failure Mother   . Pancreatic cancer Father      Review of Systems Positive for shortness of breath Negative for: General:  chills, fever, night sweats or weight changes.  Cardiovascular: PND orthopnea syncope dizziness  Dermatological skin lesions rashes Respiratory: Cough congestion Urologic: Frequent urination urination at night and hematuria Abdominal: negative for nausea, vomiting, diarrhea, bright red blood per rectum, melena, or hematemesis Neurologic: negative for visual changes, and/or hearing changes  All other systems reviewed and are otherwise negative except as noted above.  Labs: Recent Labs    05/14/20 0110  CKTOTAL 153   Lab Results  Component Value Date   WBC 3.7 (L) 05/14/2020   HGB 8.0 (L) 05/14/2020   HCT 26.7 (L) 05/14/2020   MCV 100.0 05/14/2020   PLT 71 (L) 05/14/2020    Recent Labs  Lab 05/13/20 2233 05/14/20 0110 05/14/20 0500  NA 140  --  142  K 6.4*   < > 5.8*  CL 97*  --  97*  CO2 27  --  27  BUN 79*  --  83*  CREATININE 10.99*  --  11.27*  CALCIUM 8.3*  --  8.1*  PROT 6.6  --   --   BILITOT 0.7  --   --   ALKPHOS 71  --   --   ALT 17  --   --   AST 20  --   --   GLUCOSE 140*  --  88   < > = values in this interval not displayed.   Lab Results  Component Value Date   CHOL 153 07/08/2018   HDL 35 (L) 07/08/2018    LDLCALC 55 07/08/2018   TRIG 313 (H) 07/08/2018   No results found for: DDIMER  Radiology/Studies:  DG Chest 2 View  Result Date: 04/17/2020 CLINICAL DATA:  Pneumonia EXAM: CHEST - 2 VIEW COMPARISON:  04/10/2020 FINDINGS: CABG changes with cardiac enlargement. Mild pulmonary vascular congestion has progressed. Small left pleural effusion has progressed. Small right effusion unchanged. Left lower lobe atelectasis/infiltrate with mild progression. Improved aeration right lung base. Left jugular central venous catheter tip in the right atrium unchanged. No pneumothorax IMPRESSION: Cardiac enlargement. Progressive mild vascular congestion and small left effusion suggesting fluid overload. Left lower lobe atelectasis/infiltrate with progression. Improved aeration right lung base. Electronically Signed   By: Franchot Gallo M.D.   On: 04/17/2020 08:26   DG Chest Portable 1 View  Result Date: 05/13/2020 CLINICAL DATA:  70 year old female status post cardiac arrest. EXAM: PORTABLE CHEST 1 VIEW COMPARISON:  Chest radiograph dated 05/10/2020. FINDINGS: Dialysis catheter in similar position. There is cardiomegaly with vascular congestion and small bilateral pleural effusions. No pneumothorax. Median sternotomy wires and CABG vascular clips. Atherosclerotic calcification of the aorta. No acute osseous pathology. IMPRESSION: Cardiomegaly with vascular congestion and small bilateral pleural effusions. Electronically Signed   By: Anner Crete  M.D.   On: 05/13/2020 19:12   DG Chest Port 1 View  Result Date: 05/10/2020 CLINICAL DATA:  Shortness of breath and cough. EXAM: PORTABLE CHEST 1 VIEW COMPARISON:  04/17/2020 FINDINGS: Stable positioning left jugular tunneled dialysis catheter. Lungs demonstrate increased airspace disease, right greater than left with component of small bilateral pleural effusions. Although this may represent primarily CHF/pulmonary edema, pneumonia cannot be excluded, especially in the  right mid to lower lung. No pneumothorax. Stable heart size. IMPRESSION: Increased airspace disease, right greater than left, with component of small bilateral pleural effusions. Although this may represent primarily CHF/pulmonary edema, pneumonia cannot be excluded, especially in the right mid to lower lung. Electronically Signed   By: Aletta Edouard M.D.   On: 05/10/2020 09:42    EKG: Normal sinus rhythm left atrial enlargement left anterior fascicular block  Weights: Filed Weights   05/13/20 1914  Weight: 60 kg     Physical Exam: Blood pressure (!) 166/67, pulse 63, temperature 97.7 F (36.5 C), temperature source Oral, resp. rate 12, weight 60 kg, SpO2 100 %. Body mass index is 34.39 kg/m. General: Well developed, well nourished, in no acute distress. Head eyes ears nose throat: Normocephalic, atraumatic, sclera non-icteric, no xanthomas, nares are without discharge. No apparent thyromegaly and/or mass  Lungs: Normal respiratory effort.  Few wheezes, basilar rales, no rhonchi.  Heart: RRR with normal S1 S2. no murmur gallop, no rub, PMI is normal size and placement, carotid upstroke normal without bruit, jugular venous pressure is normal Abdomen: Soft, non-tender, non-distended with normoactive bowel sounds. No hepatomegaly. No rebound/guarding. No obvious abdominal masses. Abdominal aorta is normal size without bruit Extremities: No edema. no cyanosis, no clubbing, no ulcers  Peripheral : 2+ bilateral upper extremity pulses, 2+ bilateral femoral pulses, bilateral BKA neuro: Alert and oriented. No facial asymmetry. No focal deficit. Moves all extremities spontaneously. Musculoskeletal: Normal muscle tone without kyphosis Psych:  Responds to questions appropriately with a normal affect.    Assessment: 70 year old female with known coronary disease status post coronary bypass graft peripheral vascular disease with bilateral BKA diabetes hypertension hyperlipidemia paroxysmal  nonvalvular atrial fibrillation with dialysis missing her dialysis with pulmonary vascular congestion with volume overload and V. fib arrest now stable at this time needing dialysis without evidence of acute coronary syndrome or myocardial infarction  Plan: One.  Dialysis for acute volume overload electrolyte abnormalities and V. fib arrest 2.  Continue treatment chronic anemia with the potential need for packed red blood cells or other treatment as per nephrology 3.  Would consider discontinuation of Eliquis if the patient had issues with bleeding complications listed above due to concerns of anemia exacerbating volume overload and patient remaining in normal rhythm at this time 4.  Continuation of beta-blocker for systolic dysfunction congestive heart failure heart rate control and maintenance of normal sinus rhythm 5.  Abstain from addition of amiodarone at this time due to V. fib arrest most consistent with electrolyte abnormalities and missing her dialysis rather than acute primary cardiovascular problems 6.  Echocardiogram for further evaluation of ejection fraction and further adjustments of medications thereafter  Signed, Corey Skains M.D. [facility] 05/14/2020, 8:23 AM

## 2020-05-14 NOTE — Progress Notes (Signed)
Patient was rinsed back with 30 minutes left of treatment due to low BP and patient going out, RN and doctor aware her goal was also decreased before patient went out.

## 2020-05-14 NOTE — ED Notes (Signed)
Pt given chips and orange sherbert per request.

## 2020-05-14 NOTE — Progress Notes (Signed)
PROGRESS NOTE    Stephanie Ellis  CEY:[id number] DOB: [birth year] DOA: 05/13/2020 PCP: Pcp, No  Brief Narrative:  HPI per Dr. Myles Rosenthal on 05/13/20 Stephanie Ellis is a 70 y.o. female with medical history significant of   diabetes mellitus , ESRD on HD (M/W/F) , hypertension, PAD s/p bilateral BKA,coronary artery disease status post CABG, Afib on apixaban, hx of PEA arrest during  admission 12/16  s/p administration of dilaudid, followed by admission 12/20-12/29/21 at which time she had chief complaint of change in mental status as was r/o for CVA, in additional patient was also treated for CAP.  Patient since the has had interim history of admission to ED 05/11/19 at which time she was diagnosed with fluid overload due to missing  HD sessions. Patient per notes appears to have history of refusing HD. In any event patient was discharged to follow with HD center. It appears while at [facility] patient continued to refuse HD and due this per chart has missed HD x 1 week. Today at [facility] patient was noted to have syncopal episodes and EMS was called. ON route to ED patient had v-fib arrest, she was given cpr for around 4 mins , no medications with ROSC. Patient s/p arrest was noted to be somnolent, with minimal O2 requirement without need for pressors.  Currently patient states she does not remember any of the event that transpired today. She notes for the last two weeks she has felt generally unwell, she note feeling congested with cough, but no fever/ chills/bodyaches, no n/v/d/abdomininal pain, no chest pain , but has note shortness of breath.  She states she has note attended HD because she has felt unwell.   ED Course:  On arrival to ed  Afeb, hr 51, bp 177/78, sat 100% on 4L  Labs: Wbc:4.6, hgb 8.4 ( prior 9.5), mcv 101, plt71 Lactic 1.8 NA 136, ,K6.2, CL96, bicarb 25, glu 155, cr 10.8,calcium 7.9 ablumin 3.2 Ce:145 up from 68, tx calcium gluconate vBG: ph 7/27/pco2 60, COVID swab  negative SLP:NPYYF at 61, pac, RBBB,LAFB, no acute st-t wave chagnes cxr Cardiomegaly with vascular congestion and small bilateral pleural Effusions.  tx calcium gluconate x 2 ,bicarb x 1  **Interim History  Seen and examined and cardiology nephrology consulted.  Patient to undergo dialysis today.  Cardiology recommending echocardiogram which is been ordered and pending to be done.  She denies any chest pain.  Assessment & Plan:   Active Problems:   Cardiac arrest ([place])  V-fib Arrest -Likely related to metabolic derangements /fluid overload with hypoxemia  related to noncompliance with HD x 1 week -s/p arrest back to baseline mental status, no requirement for pressors, Minimal O2 requirement, EKG no hyperacute findings -S/p calcium gluconate x 2 and bicarb x 1 -Urgent HD planned and still pending to be done -Nephrology consulted and will f/u on further renal recs in am  -Continue to monitor electrolytes closely  -HR now 50's  Resume bb as tolerated  -ECHOCardiogram ordered and pending to be done -Cardiology consulted and recommending Continuation of BB for Systolic Dysfunction for Heart Failure and Maintenance of HR control for Maintenance of NSR -Cardiology recommending abstaining from Addition of Amiodarone due to V Fib Arrest 2/2 to Electrolyte Abnormalities    Hx of CAD s/p CABG +stenting -Elevated Troponin presumed due to arrest and flat and went from 125 -> 129 -> 120 -resume cardiac regimen as blood pressure and hr can tolerate  -Resume ASA 81 mg po Daily, Atorvastatin 80 mg  po Daily, Carvedilol 25 mg po BID, and Isosorbide Mononitrate 60 mg po Daily  -Await final Cardiology Recommendations    Hyperkalemia  -s/p bicarb /calcium gluconate x 2  -EKG no signs of hyperacute t-waves  -urgent HD planned  -repeat labs Improving and K+ went from 6.4 -> 6.0 -> 5.8 -> 5.3 -Continue to Monitor and Trend -Repeat CMP in the AM   Hypocalemia -Patient's Ca2+ was 8.3 -> 8.1 ->  7.9; Corrected for Albumin her Ca2+ was 8.7 -S/p calcium gluconate x2 -Continue to Monitor and Trend -Repeat CMP in the AM   Acute CHFrEF exacerbation   -Due to noncompliance with HD -HD planned urgently  -place on CHF protocol  -She will need 1200 mL Fluid restriction  -Fluid management with HD -followed by [facility] Heart failure clinic (last seen 09/14/19) -C/w Carvedilol 25 mg po BID and Losartan 50 mg po Daily being held   Anemia of Chronic Kidney Disease/Macrocytic Anemia  -Patient's Hgb/Hct went from 8.0/26.7 -> 8.2/27.0 -Check Anemia Panel in the AM  -Continue to Monitor for S/Sx of Bleeding; Currently no overt bleeding noted -Repeat CBC in the AM   Pancytopenia -Patient's WBC is now 3.7, Hgb/Hct is now 8.2/27.0, and Platelet Count is 74 -Continue to Monitor and Trend -Repeat CBC in the AM   ESRD on HD MWF Elevated Anion Gap -Continue with renal medications -Patient's BUN/Cr wetnf rom 79/10.99 -> 83/11.27 -> 82/11.67  Paroxysmal Nonvalvular Atrial Fibrillation  -Continue Carvedilol 25 mg BID -Continue and Resumed Apixaban 2.5 mg BID but Cardiology recommending considering discontinuation of eliquis if she had issues with bleeding  HTN -Currently holding Losartan 50 mg po Daily and Hydralazine 75 mg po q8h and consider resuming in the AM  -C/w Carvedilol 25 mg po BID -Continue to Monitor Blood Pressures per Protocol  -Last BP was   Diabetes Mellitus Type 2 -Placed on Very Sensitive Novolog SSI AC -resume low dose lantus in am as patient tolerates  GERD -C/w Pantoprazole 40 mg po Daily   Hx of Seizures -C/w Keppra 500 mg BID + 250 mg TID after dialysis  Anxiety Depression  -Will resume Mirtazapine 7.5 mg po Daily   HLD -Continue Atorvastatin 80 mg po qHS   Obesity -Complicates overall prognosis and care -Estimated body mass index is 34.39 kg/m as calculated from the following:   Height as of 04/10/20: 4' 4"  (1.321 m).   Weight as of this  encounter: 60 kg. -Weight Loss and Dietary Counseling given  DVT prophylaxis: Anticoagulated with Apixaban 2.5 mg po BID  Code Status: FULL CODE  Family Communication: No family present at bedside  Disposition Plan: Pending further clinical improvement and clearance by Cardiology and Nephrology   Status is: Inpatient  Remains inpatient appropriate because:Unsafe d/c plan, IV treatments appropriate due to intensity of illness or inability to take PO and Inpatient level of care appropriate due to severity of illness   Dispo: The patient is from: SNF              Anticipated d/c is to: SNF              Anticipated d/c date is: 2 days              Patient currently is not medically stable to d/c.   Difficult to place patient No   Consultants:   Nephrology  Cardiology Dr. Nehemiah Massed    Procedures:  Dialysis   ECHOCARDIOGRAM, done and pending read  Antimicrobials:  Anti-infectives (From admission,  onward)   None        Subjective: Seen and examined at bedside and she is doing okay.  She denies any nausea or vomiting.  Denies any chest pain or shortness of breath currently.  About to undergo dialysis later.  Feels okay.  No other concerns or complaints at this time.  Objective: Vitals:   05/14/20 0000 05/14/20 0100 05/14/20 0400 05/14/20 0500  BP: (!) 143/44 (!) 154/50 (!) 163/60 (!) 166/67  Pulse: (!) 58 (!) 58 62 63  Resp: 17 20 11 12   Temp:      TempSrc:      SpO2: 100% 100% 98% 100%  Weight:        Intake/Output Summary (Last 24 hours) at 05/14/2020 0759 Last data filed at 05/13/2020 2219 Gross per 24 hour  Intake --  Output 500 ml  Net -500 ml   Filed Weights   05/13/20 1914  Weight: 60 kg   Examination: Physical Exam:  Constitutional: WN/WD obese AAF in  NAD and appears calm and comfortable Eyes: Lids and conjunctivae normal, sclerae anicteric  ENMT: External Ears, Nose appear normal. Grossly normal hearing.  Neck: Appears normal, supple, no cervical  masses, normal ROM, no appreciable thyromegaly; no JVD Respiratory: Diminished to auscultation bilaterally, no wheezing, rales, rhonchi or crackles. Normal respiratory effort and patient is not tachypenic. No accessory muscle use.  Cardiovascular: RRR, no murmurs / rubs / gallops. S1 and S2 auscultated. No appreciable edema Abdomen: Soft, non-tender, Distended 2/2 to body habitus.Bowel sounds positive.  GU: Deferred. Musculoskeletal: No clubbing / cyanosis of digits/nails. Bilateral BKA and has a IO right under her Right knee Skin: No rashes, lesions, ulcers on a limited skin evaluation. No induration; Warm and dry.  Neurologic: CN 2-12 grossly intact with no focal deficits. Romberg and sign cerebellar reflexes not assessed.  Psychiatric: Normal judgment and insight. Alert and oriented x 3. Normal mood and appropriate affect.   Data Reviewed: I have personally reviewed following labs and imaging studies  CBC: Recent Labs  Lab 05/10/20 0944 05/13/20 1803 05/14/20 0500  WBC 4.5 4.6 3.7*  NEUTROABS 3.3 3.2  --   HGB 9.5* 8.4* 8.0*  HCT 31.5* 28.5* 26.7*  MCV 100.6* 101.8* 100.0  PLT 84* 71* 71*   Basic Metabolic Panel: Recent Labs  Lab 05/10/20 0944 05/13/20 1803 05/13/20 2232 05/13/20 2233 05/14/20 0110 05/14/20 0500  NA 139 136  --  140  --  142  K 6.5* 6.2*  --  6.4* 6.0* 5.8*  CL 98 96*  --  97*  --  97*  CO2 24 25  --  27  --  27  GLUCOSE 51* 155*  --  140*  --  88  BUN 88* 75*  --  79*  --  83*  CREATININE 10.38* 10.80*  --  10.99*  --  11.27*  CALCIUM 8.7* 7.9*  --  8.3*  --  8.1*  MG  --   --  2.3  --   --   --    GFR: Estimated Creatinine Clearance: 3 mL/min (A) (by C-G formula based on SCr of 11.27 mg/dL (H)). Liver Function Tests: Recent Labs  Lab 05/10/20 0944 05/13/20 1803 05/13/20 2233  AST 21 24 20   ALT 27 19 17   ALKPHOS 88 75 71  BILITOT 0.8 0.8 0.7  PROT 6.9 6.8 6.6  ALBUMIN 3.5 3.2* 3.2*   No results for input(s): LIPASE, AMYLASE in the last  168 hours. No results for  input(s): AMMONIA in the last 168 hours. Coagulation Profile: Recent Labs  Lab 05/10/20 0944  INR 1.7*   Cardiac Enzymes: Recent Labs  Lab 05/14/20 0110  CKTOTAL 153   BNP (last 3 results) No results for input(s): PROBNP in the last 8760 hours. HbA1C: No results for input(s): HGBA1C in the last 72 hours. CBG: No results for input(s): GLUCAP in the last 168 hours. Lipid Profile: No results for input(s): CHOL, HDL, LDLCALC, TRIG, CHOLHDL, LDLDIRECT in the last 72 hours. Thyroid Function Tests: No results for input(s): TSH, T4TOTAL, FREET4, T3FREE, THYROIDAB in the last 72 hours. Anemia Panel: Recent Labs    05/13/20 2233  FOLATE 7.3   Sepsis Labs: Recent Labs  Lab 05/13/20 1803 05/13/20 2232  LATICACIDVEN 1.8 0.7    Recent Results (from the past 240 hour(s))  Resp Panel by RT-PCR (Flu A&B, Covid) Nasopharyngeal Swab     Status: None   Collection Time: 05/10/20  9:44 AM   Specimen: Nasopharyngeal Swab; Nasopharyngeal(NP) swabs in vial transport medium  Result Value Ref Range Status   SARS Coronavirus 2 by RT PCR NEGATIVE NEGATIVE Final    Comment: (NOTE) SARS-CoV-2 target nucleic acids are NOT DETECTED.  The SARS-CoV-2 RNA is generally detectable in upper respiratory specimens during the acute phase of infection. The lowest concentration of SARS-CoV-2 viral copies this assay can detect is 138 copies/mL. A negative result does not preclude SARS-Cov-2 infection and should not be used as the sole basis for treatment or other patient management decisions. A negative result may occur with  improper specimen collection/handling, submission of specimen other than nasopharyngeal swab, presence of viral mutation(s) within the areas targeted by this assay, and inadequate number of viral copies(<138 copies/mL). A negative result must be combined with clinical observations, patient history, and epidemiological information. The expected result is  Negative.  Fact Sheet for Patients:  EntrepreneurPulse.com.au  Fact Sheet for Healthcare Providers:  IncredibleEmployment.be  This test is no t yet approved or cleared by the [place] FDA and  has been authorized for detection and/or diagnosis of SARS-CoV-2 by FDA under an Emergency Use Authorization (EUA). This EUA will remain  in effect (meaning this test can be used) for the duration of the COVID-19 declaration under Section 564(b)(1) of the Act, 21 U.S.C.section 360bbb-3(b)(1), unless the authorization is terminated  or revoked sooner.       Influenza A by PCR NEGATIVE NEGATIVE Final   Influenza B by PCR NEGATIVE NEGATIVE Final    Comment: (NOTE) The Xpert Xpress SARS-CoV-2/FLU/RSV plus assay is intended as an aid in the diagnosis of influenza from Nasopharyngeal swab specimens and should not be used as a sole basis for treatment. Nasal washings and aspirates are unacceptable for Xpert Xpress SARS-CoV-2/FLU/RSV testing.  Fact Sheet for Patients: EntrepreneurPulse.com.au  Fact Sheet for Healthcare Providers: IncredibleEmployment.be  This test is not yet approved or cleared by the [place] FDA and has been authorized for detection and/or diagnosis of SARS-CoV-2 by FDA under an Emergency Use Authorization (EUA). This EUA will remain in effect (meaning this test can be used) for the duration of the COVID-19 declaration under Section 564(b)(1) of the Act, 21 U.S.C. section 360bbb-3(b)(1), unless the authorization is terminated or revoked.  Performed at [facility], [address] [place], [place] [address]   SARS Coronavirus 2 by RT PCR (hospital order, performed in [facility] hospital lab) Nasopharyngeal Nasopharyngeal Swab     Status: None   Collection Time: 05/13/20  6:08 PM  Specimen: Nasopharyngeal Swab  Result Value Ref Range Status   SARS Coronavirus 2 NEGATIVE NEGATIVE  Final    Comment: (NOTE) SARS-CoV-2 target nucleic acids are NOT DETECTED.  The SARS-CoV-2 RNA is generally detectable in upper and lower respiratory specimens during the acute phase of infection. The lowest concentration of SARS-CoV-2 viral copies this assay can detect is 250 copies / mL. A negative result does not preclude SARS-CoV-2 infection and should not be used as the sole basis for treatment or other patient management decisions.  A negative result may occur with improper specimen collection / handling, submission of specimen other than nasopharyngeal swab, presence of viral mutation(s) within the areas targeted by this assay, and inadequate number of viral copies (<250 copies / mL). A negative result must be combined with clinical observations, patient history, and epidemiological information.  Fact Sheet for Patients:   StrictlyIdeas.no  Fact Sheet for Healthcare Providers: BankingDealers.co.za  This test is not yet approved or  cleared by the [place] FDA and has been authorized for detection and/or diagnosis of SARS-CoV-2 by FDA under an Emergency Use Authorization (EUA).  This EUA will remain in effect (meaning this test can be used) for the duration of the COVID-19 declaration under Section 564(b)(1) of the Act, 21 U.S.C. section 360bbb-3(b)(1), unless the authorization is terminated or revoked sooner.  Performed at [facility], [address] [place], [place] [address]     RN Pressure Injury Documentation:     Estimated body mass index is 34.39 kg/m as calculated from the following:   Height as of 04/10/20: 4' 4"  (1.321 m).   Weight as of this encounter: 60 kg.  Malnutrition Type:   Malnutrition Characteristics:   Nutrition Interventions:   Radiology Studies: DG Chest Portable 1 View  Result Date: 05/13/2020 CLINICAL DATA:  70 year old female status post cardiac arrest. EXAM: PORTABLE  CHEST 1 VIEW COMPARISON:  Chest radiograph dated 05/10/2020. FINDINGS: Dialysis catheter in similar position. There is cardiomegaly with vascular congestion and small bilateral pleural effusions. No pneumothorax. Median sternotomy wires and CABG vascular clips. Atherosclerotic calcification of the aorta. No acute osseous pathology. IMPRESSION: Cardiomegaly with vascular congestion and small bilateral pleural effusions. Electronically Signed   By: Anner Crete M.D.   On: 05/13/2020 19:12   Scheduled Meds: . carvedilol  25 mg Oral BID WC  . insulin aspart  0-6 Units Subcutaneous TID WC  . levETIRAcetam  500 mg Oral BID  . pantoprazole  40 mg Oral Daily   Continuous Infusions:   LOS: 1 day   Kerney Elbe, DO [facility] PAGER is on AMION  If 7PM-7AM, please contact night-coverage [email or web address]

## 2020-05-14 NOTE — ED Notes (Signed)
Date and time results received: 05/14/20  12:08 AM  Test: K Critical Value: 6.4 Name of Provider Notified: Direct message Dr. Marcello Moores

## 2020-05-14 NOTE — ED Notes (Signed)
Pt to dialysis.

## 2020-05-14 NOTE — Progress Notes (Signed)
[facility]  ROUNDING NOTE   Subjective:   [name] was admitted to [facility] on 05/13/2020 for Cardiac arrest [facility]) [I46.9]  Last hemodialysis treatment was 1/19.   Patient was seen and examined on emergent hemodialysis treatment.     HEMODIALYSIS FLOWSHEET:  Blood Flow Rate (mL/min): 200 mL/min Arterial Pressure (mmHg): -80 mmHg Venous Pressure (mmHg): 70 mmHg Transmembrane Pressure (mmHg): 30 mmHg Ultrafiltration Rate (mL/min): 70 mL/min Dialysate Flow Rate (mL/min): 600 ml/min Conductivity: Machine : 13.8 Conductivity: Machine : 13.8 Dialysis Fluid Bolus: Normal Saline Bolus Amount (mL): 250 mL      Objective:  Vital signs in last 24 hours:  Temp:  [97.7 F (36.5 C)-98.2 F (36.8 C)] 98.2 F (36.8 C) (01/23 1022) Pulse Rate:  [38-68] 65 (01/23 1215) Resp:  [0-21] 18 (01/23 1215) BP: (111-184)/(44-83) 156/59 (01/23 1215) SpO2:  [90 %-100 %] 100 % (01/23 1022) Weight:  [60 kg] 60 kg (01/22 1914)  Weight change:  Filed Weights   05/13/20 1914  Weight: 60 kg    Intake/Output: I/O last 3 completed shifts: In: -  Out: 500 [Urine:500]   Intake/Output this shift:  Total I/O In: -  Out: 595 [Other:595]  Physical Exam: General: NAD, laying in bed  Head: Normocephalic, atraumatic. Moist oral mucosal membranes  Eyes: Anicteric, PERRL  Neck: Supple, trachea midline  Lungs:  Clear to auscultation  Heart: Regular rate and rhythm  Abdomen:  Soft, nontender,   Extremities: Bilateral BKA  Neurologic: Nonfocal, moving all four extremities  Skin: No lesions  Access: RIJ permcath    Basic Metabolic Panel: Recent Labs  Lab 05/10/20 0944 05/13/20 1803 05/13/20 2232 05/13/20 2233 05/14/20 0110 05/14/20 0500 05/14/20 0858  NA 139 136  --  140  --  142 141  K 6.5* 6.2*  --  6.4* 6.0* 5.8* 5.3*  CL 98 96*  --  97*  --  97* 99  CO2 24 25  --  27  --  27 26  GLUCOSE 51* 155*  --  140*  --  88 87  BUN 88* 75*  --  79*  --  83* 82*   CREATININE 10.38* 10.80*  --  10.99*  --  11.27* 11.67*  CALCIUM 8.7* 7.9*  --  8.3*  --  8.1* 7.9*  MG  --   --  2.3  --   --   --   --   PHOS  --   --   --   --   --   --  5.9*    Liver Function Tests: Recent Labs  Lab 05/10/20 0944 05/13/20 1803 05/13/20 2233 05/14/20 0858  AST 21 24 20   --   ALT 27 19 17   --   ALKPHOS 88 75 71  --   BILITOT 0.8 0.8 0.7  --   PROT 6.9 6.8 6.6  --   ALBUMIN 3.5 3.2* 3.2* 3.0*   No results for input(s): LIPASE, AMYLASE in the last 168 hours. No results for input(s): AMMONIA in the last 168 hours.  CBC: Recent Labs  Lab 05/10/20 0944 05/13/20 1803 05/14/20 0500 05/14/20 0858  WBC 4.5 4.6 3.7* 3.7*  NEUTROABS 3.3 3.2  --   --   HGB 9.5* 8.4* 8.0* 8.2*  HCT 31.5* 28.5* 26.7* 27.0*  MCV 100.6* 101.8* 100.0 100.4*  PLT 84* 71* 71* 74*    Cardiac Enzymes: Recent Labs  Lab 05/14/20 0110  CKTOTAL 153    BNP: Invalid input(s): POCBNP  CBG: Recent Labs  Lab 05/14/20 0853  GLUCAP 50    Microbiology: Results for orders placed or performed during the hospital encounter of 05/13/20  SARS Coronavirus 2 by RT PCR (hospital order, performed in [facility] hospital lab) Nasopharyngeal Nasopharyngeal Swab     Status: None   Collection Time: 05/13/20  6:08 PM   Specimen: Nasopharyngeal Swab  Result Value Ref Range Status   SARS Coronavirus 2 NEGATIVE NEGATIVE Final    Comment: (NOTE) SARS-CoV-2 target nucleic acids are NOT DETECTED.  The SARS-CoV-2 RNA is generally detectable in upper and lower respiratory specimens during the acute phase of infection. The lowest concentration of SARS-CoV-2 viral copies this assay can detect is 250 copies / mL. A negative result does not preclude SARS-CoV-2 infection and should not be used as the sole basis for treatment or other patient management decisions.  A negative result may occur with improper specimen collection / handling, submission of specimen other than nasopharyngeal swab, presence  of viral mutation(s) within the areas targeted by this assay, and inadequate number of viral copies (<250 copies / mL). A negative result must be combined with clinical observations, patient history, and epidemiological information.  Fact Sheet for Patients:   StrictlyIdeas.no  Fact Sheet for Healthcare Providers: BankingDealers.co.za  This test is not yet approved or  cleared by the [place] FDA and has been authorized for detection and/or diagnosis of SARS-CoV-2 by FDA under an Emergency Use Authorization (EUA).  This EUA will remain in effect (meaning this test can be used) for the duration of the COVID-19 declaration under Section 564(b)(1) of the Act, 21 U.S.C. section 360bbb-3(b)(1), unless the authorization is terminated or revoked sooner.  Performed at [facility], [address] [place], [place] [address]     Coagulation Studies: No results for input(s): LABPROT, INR in the last 72 hours.  Urinalysis: No results for input(s): COLORURINE, LABSPEC, PHURINE, GLUCOSEU, HGBUR, BILIRUBINUR, KETONESUR, PROTEINUR, UROBILINOGEN, NITRITE, LEUKOCYTESUR in the last 72 hours.  Invalid input(s): APPERANCEUR    Imaging: DG Chest Portable 1 View  Result Date: 05/13/2020 CLINICAL DATA:  70 year old female status post cardiac arrest. EXAM: PORTABLE CHEST 1 VIEW COMPARISON:  Chest radiograph dated 05/10/2020. FINDINGS: Dialysis catheter in similar position. There is cardiomegaly with vascular congestion and small bilateral pleural effusions. No pneumothorax. Median sternotomy wires and CABG vascular clips. Atherosclerotic calcification of the aorta. No acute osseous pathology. IMPRESSION: Cardiomegaly with vascular congestion and small bilateral pleural effusions. Electronically Signed   By: Anner Crete M.D.   On: 05/13/2020 19:12     Medications:    . apixaban  2.5 mg Oral BID  . aspirin EC  81 mg Oral Daily  .  atorvastatin  80 mg Oral QHS  . brimonidine  1 drop Both Eyes TID  . carvedilol  25 mg Oral BID WC  . Chlorhexidine Gluconate Cloth  6 each Topical Q0600  . fluticasone  2 spray Each Nare Daily  . insulin aspart  0-6 Units Subcutaneous TID WC  . isosorbide mononitrate  60 mg Oral Daily  . [START ON 05/15/2020] levETIRAcetam  250 mg Oral Q M,W,F  . levETIRAcetam  500 mg Oral BID  . mirtazapine  7.5 mg Oral Daily  . pantoprazole  40 mg Oral Daily  . sevelamer carbonate  1,600 mg Oral TID WC  . sodium zirconium cyclosilicate  5 g Oral Daily   acetaminophen, docusate sodium, ipratropium-albuterol, ondansetron (ZOFRAN) IV, polyethylene glycol  Assessment/ Plan:  [name]  is a 79 y.o. black female with end stage renal disease on hemodialysis, hypertension, diabetes mellitus type II, coronary artery disease status post CABG, peripheral vascular disease status post bilateral lower extremity amputations, GERD, congestive heart failure, hyperlipidemia, who was admitted to [facility] on 05/13/2020 for Cardiac arrest [facility]) [I46.9]  [facility] Nephrology MWF [facility] [address] RIJ permcath 52kg  1. End Stage Renal Disease on hemodialysis - seen and examined on hemodialysis treatment. Plan on dialysis again tomorrow  2. Anemia of chronic kidney disease: hemoglobin 8.2, macrocytic. Gets mircera as outpatient.  EPO with MWF schedule.   3. Secondary Hyperparathyroidism:  - sevelamer with meals.   4. Hypertension: with history of hypotension on dialysis treatments. Home regimen of hydralazine, carvedilol, losartan and isosorbide mononitrate.    LOS: 1 Stephanie Ellis 1/23/20221:11 PM

## 2020-05-14 NOTE — ED Notes (Signed)
Pt placed on bed alarm, call bell within reach

## 2020-05-14 NOTE — ED Notes (Signed)
Pt changed of bm, [place] place on pt

## 2020-05-14 NOTE — ED Notes (Signed)
Pt readjusted in bed. [place] placed back on pt. Bed alarm in place

## 2020-05-15 ENCOUNTER — Inpatient Hospital Stay
Admit: 2020-05-15 | Discharge: 2020-05-15 | Disposition: A | Discharge status: Home / Self Care | Payer: Medicare Other | Attending: Internal Medicine | Admitting: Internal Medicine

## 2020-05-15 DIAGNOSIS — I5043 Acute on chronic combined systolic (congestive) and diastolic (congestive) heart failure: Secondary | ICD-10-CM

## 2020-05-15 DIAGNOSIS — J9601 Acute respiratory failure with hypoxia: Secondary | ICD-10-CM

## 2020-05-15 DIAGNOSIS — Z9115 Patient's noncompliance with renal dialysis: Secondary | ICD-10-CM

## 2020-05-15 DIAGNOSIS — E875 Hyperkalemia: Secondary | ICD-10-CM | POA: Diagnosis present

## 2020-05-15 LAB — COMPREHENSIVE METABOLIC PANEL
ALT: 14 U/L (ref 0–44)
AST: 16 U/L (ref 15–41)
Albumin: 3.1 g/dL — ABNORMAL LOW (ref 3.5–5.0)
Alkaline Phosphatase: 69 U/L (ref 38–126)
Anion gap: 16 — ABNORMAL HIGH (ref 5–15)
BUN: 45 mg/dL — ABNORMAL HIGH (ref 8–23)
CO2: 30 mmol/L (ref 22–32)
Calcium: 7.8 mg/dL — ABNORMAL LOW (ref 8.9–10.3)
Chloride: 94 mmol/L — ABNORMAL LOW (ref 98–111)
Creatinine, Ser: 7.67 mg/dL — ABNORMAL HIGH (ref 0.44–1.00)
GFR, Estimated: 5 mL/min — ABNORMAL LOW (ref >60)
Glucose, Bld: 166 mg/dL — ABNORMAL HIGH (ref 70–99)
Potassium: 4.2 mmol/L (ref 3.5–5.1)
Sodium: 140 mmol/L (ref 135–145)
Total Bilirubin: 0.7 mg/dL (ref 0.3–1.2)
Total Protein: 6.5 g/dL (ref 6.5–8.1)

## 2020-05-15 LAB — ECHOCARDIOGRAM COMPLETE
AR max vel: 1.66 cm2
AV Area VTI: 1.72 cm2
AV Area mean vel: 1.72 cm2
AV Mean grad: 3 mmHg
AV Peak grad: 6.7 mmHg
Ao pk vel: 1.29 m/s
Area-P 1/2: 3.26 cm2
Calc EF: 52.2 %
MV VTI: 1.07 cm2
S' Lateral: 3.42 cm
Single Plane A2C EF: 48.3 %
Single Plane A4C EF: 54.3 %
Weight: 2116.42 oz

## 2020-05-15 LAB — CBC WITH DIFFERENTIAL/PLATELET
Abs Immature Granulocytes: 0.03 10*3/uL (ref 0.00–0.07)
Basophils Absolute: 0 10*3/uL (ref 0.0–0.1)
Basophils Relative: 1 %
Eosinophils Absolute: 0.1 10*3/uL (ref 0.0–0.5)
Eosinophils Relative: 2 %
HCT: 27.3 % — ABNORMAL LOW (ref 36.0–46.0)
Hemoglobin: 8.2 g/dL — ABNORMAL LOW (ref 12.0–15.0)
Immature Granulocytes: 1 %
Lymphocytes Relative: 22 %
Lymphs Abs: 0.8 10*3/uL (ref 0.7–4.0)
MCH: 29.7 pg (ref 26.0–34.0)
MCHC: 30 g/dL (ref 30.0–36.0)
MCV: 98.9 fL (ref 80.0–100.0)
Monocytes Absolute: 0.4 10*3/uL (ref 0.1–1.0)
Monocytes Relative: 10 %
Neutro Abs: 2.3 10*3/uL (ref 1.7–7.7)
Neutrophils Relative %: 64 %
Platelets: 73 10*3/uL — ABNORMAL LOW (ref 150–400)
RBC: 2.76 MIL/uL — ABNORMAL LOW (ref 3.87–5.11)
RDW: 18.3 % — ABNORMAL HIGH (ref 11.5–15.5)
Smear Review: NORMAL
WBC: 3.5 10*3/uL — ABNORMAL LOW (ref 4.0–10.5)
nRBC: 0 % (ref 0.0–0.2)

## 2020-05-15 LAB — RETICULOCYTES
Immature Retic Fract: 10.6 % (ref 2.3–15.9)
RBC.: 2.68 MIL/uL — ABNORMAL LOW (ref 3.87–5.11)
Retic Count, Absolute: 34.8 10*3/uL (ref 19.0–186.0)
Retic Ct Pct: 1.3 % (ref 0.4–3.1)

## 2020-05-15 LAB — CBG MONITORING, ED
Glucose-Capillary: 147 mg/dL — ABNORMAL HIGH (ref 70–99)
Glucose-Capillary: 149 mg/dL — ABNORMAL HIGH (ref 70–99)
Glucose-Capillary: 133 mg/dL — ABNORMAL HIGH (ref 70–99)
Glucose-Capillary: 97 mg/dL (ref 70–99)

## 2020-05-15 LAB — FERRITIN: Ferritin: 1384 ng/mL — ABNORMAL HIGH (ref 11–307)

## 2020-05-15 LAB — IRON AND TIBC
Iron: 52 ug/dL (ref 28–170)
Saturation Ratios: 32 % — ABNORMAL HIGH (ref 10.4–31.8)
TIBC: 164 ug/dL — ABNORMAL LOW (ref 250–450)
UIBC: 112 ug/dL

## 2020-05-15 LAB — MAGNESIUM: Magnesium: 2 mg/dL (ref 1.7–2.4)

## 2020-05-15 LAB — FOLATE: Folate: 5.4 ng/mL — ABNORMAL LOW (ref >5.9)

## 2020-05-15 LAB — VITAMIN B12: Vitamin B-12: 1681 pg/mL — ABNORMAL HIGH (ref 180–914)

## 2020-05-15 LAB — PHOSPHORUS: Phosphorus: 4.5 mg/dL (ref 2.5–4.6)

## 2020-05-15 IMAGING — DX DG TOE GREAT 2+V*L*
3 series · 3 of 3 positions shown · non-contrast
Comparison: 10/07/2017

CLINICAL DATA: Open wound to the left great toe for several months.

EXAM:
LEFT GREAT TOE

[toe ap]
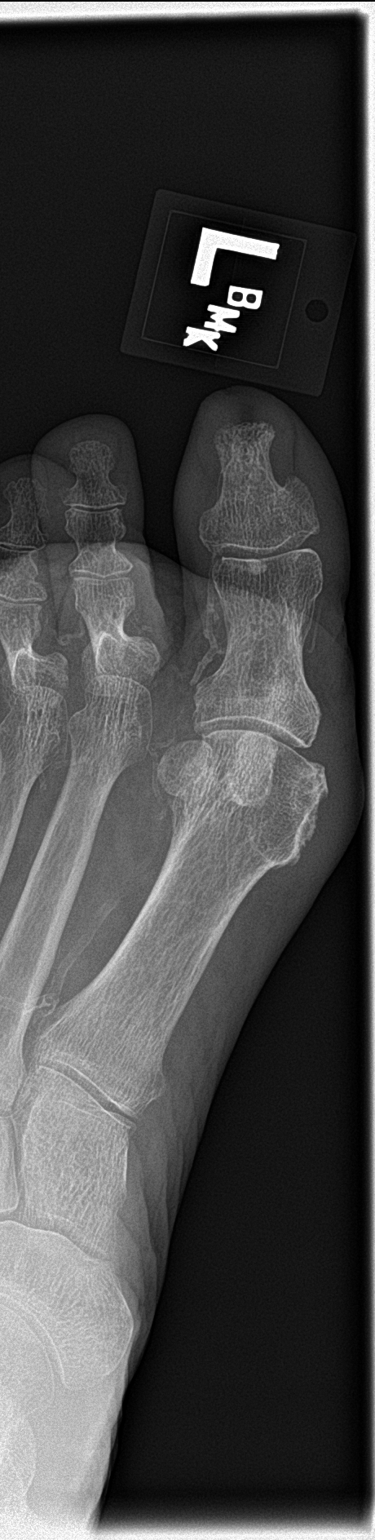

[toe obl]
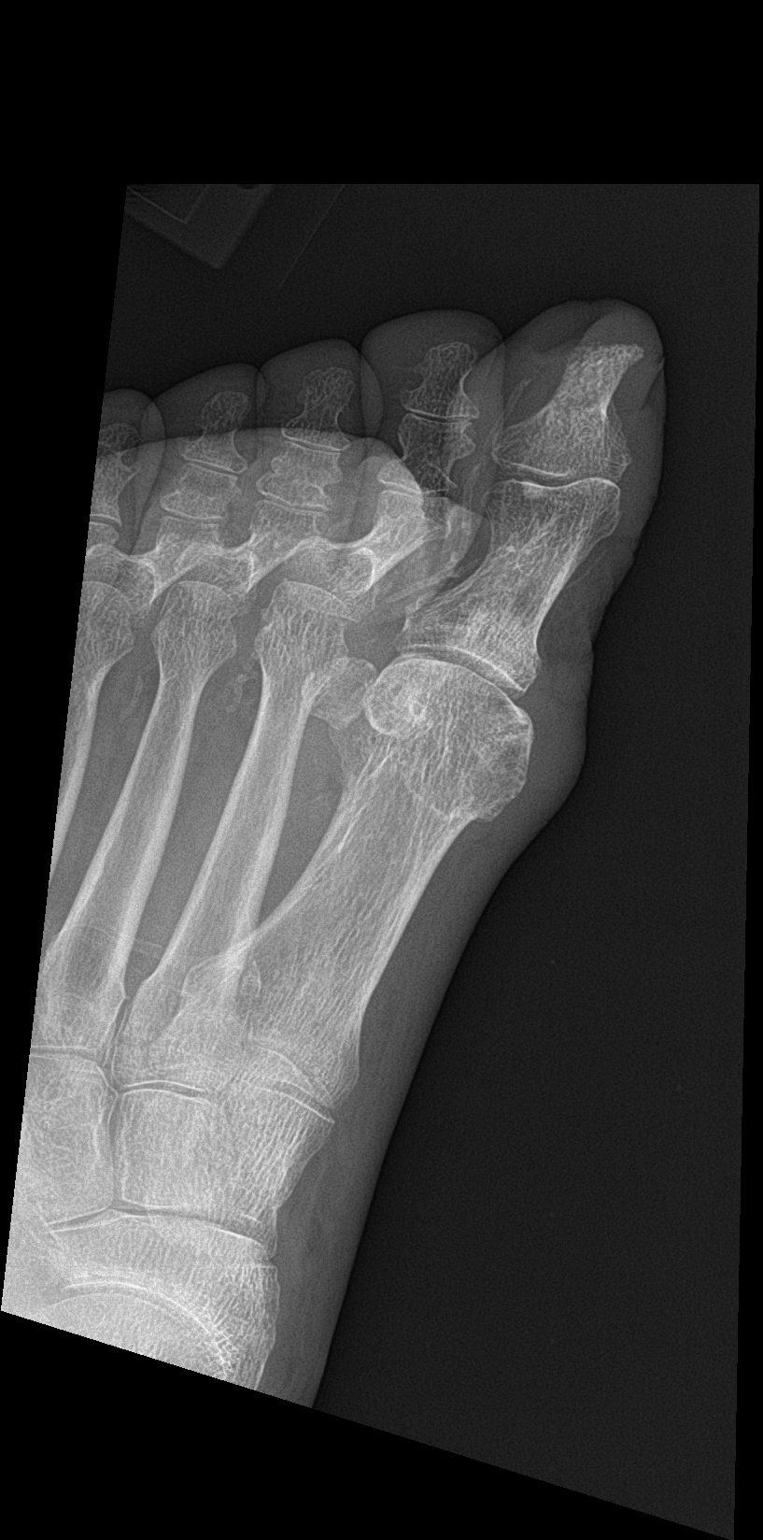

[toe lat]
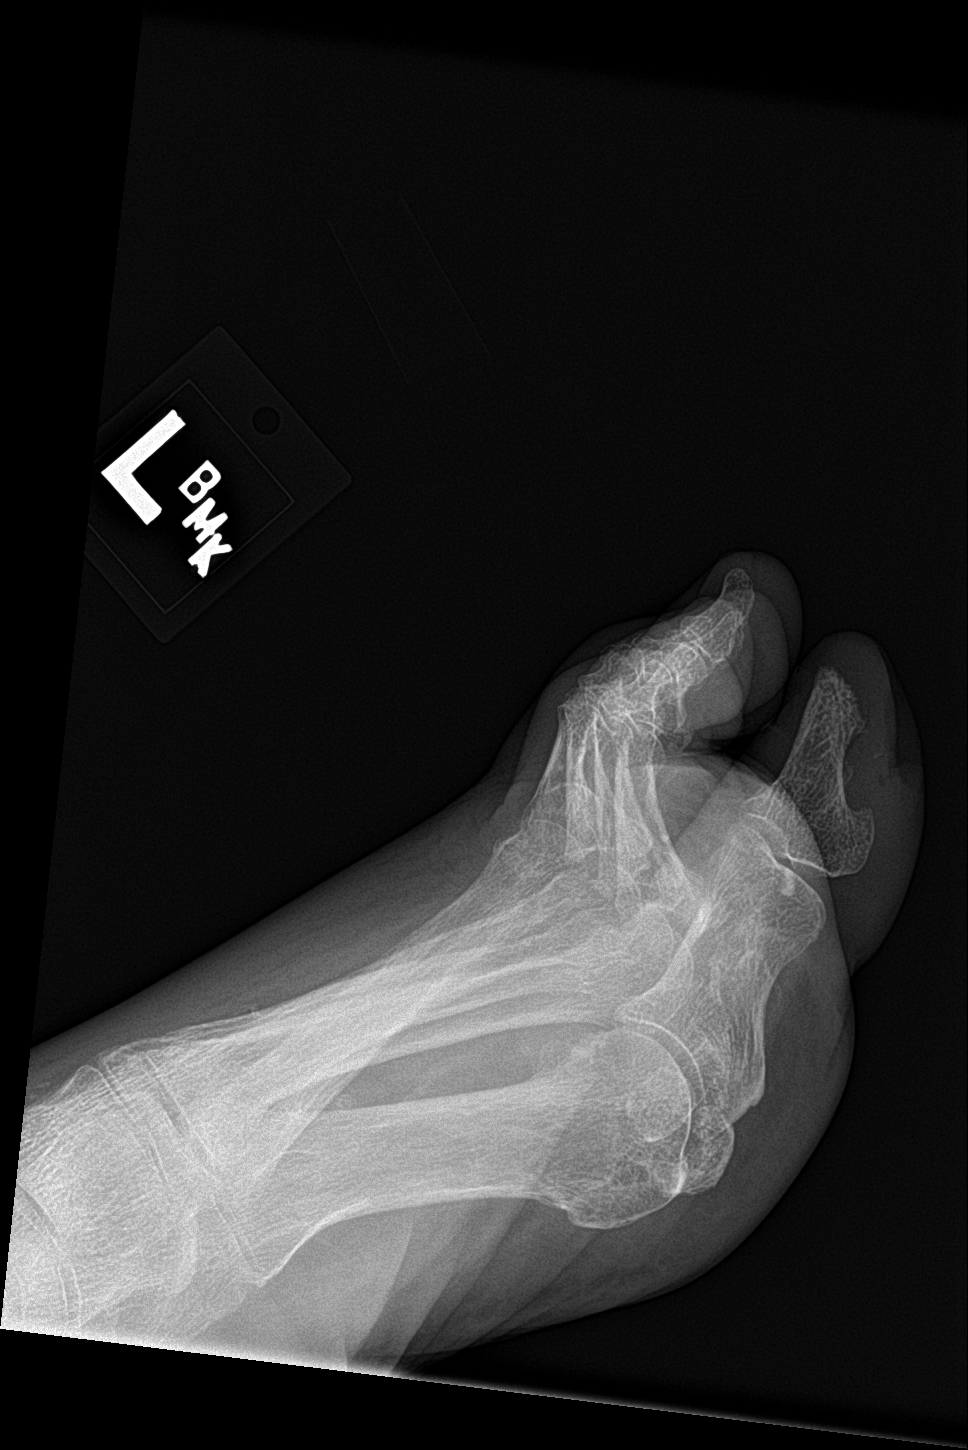

[3 of 3 positions shown; findings below may reference images not displayed]

FINDINGS: Degenerative changes in the first metatarsal-phalangeal joint. Soft
tissue defect over the plantar aspect of the left first toe. There
is underlying erosion of the distal phalangeal tuft cortex
suggesting focal osteomyelitis. No evidence of acute fracture or
dislocation. Diffuse bone demineralization. Prominent vascular
calcifications.
IMPRESSION: Soft tissue defect over the plantar aspect of the left first toe
with underlying erosion of the distal phalangeal tuft suggesting
osteomyelitis.

## 2020-05-15 MED ORDER — EPOETIN ALFA 10000 UNIT/ML IJ SOLN
10000.0000 [IU] | INTRAMUSCULAR | Status: DC
  Filled 2020-05-15: qty 1

## 2020-05-15 NOTE — ED Notes (Signed)
Patient set up with breakfast tray

## 2020-05-15 NOTE — ED Notes (Signed)
Pt brief and chux pad changed. Small amount of stool noted in brief. Pt cleansed with wipes. Purewick changed and connected to suction. Pt covered with fresh warm blankets and repositioned in bed. Denies further needs at this time.

## 2020-05-15 NOTE — ED Notes (Signed)
Meal tray delivered at this time.

## 2020-05-15 NOTE — ED Notes (Signed)
Pt awake and states that she feels her blood sugar is low. Blood sugar checked. Pt denies further needs at this time.

## 2020-05-15 NOTE — ED Notes (Signed)
Lab called to collect morning labs.

## 2020-05-15 NOTE — ED Notes (Signed)
Pt given graham crackers and peanut butter per request.

## 2020-05-15 NOTE — Progress Notes (Signed)
PROGRESS NOTE  Stephanie Ellis ZYY:[id number] DOB: 04-28-1950 DOA: 05/13/2020 PCP: Pcp, No  HPI/Recap of past 99 hours: 70 year old female past medical history of diabetes, hypertension, PAD status post bilateral BKA, CAD status post CABG, A. fib on apixaban and PEA arrest felt to be secondary to Dilaudid and recent admission discharged on 12/29 came to the emergency room on 1/22 after syncopal episodes at her nursing home.  Since last hospitalization, patient has missed multiple dialysis visits due to not feeling well.  While being transported by EMS to the emergency room, patient had a V. fib arrest and was given CPR for 4 minutes and resuscitated.  In the emergency room, found to be hyperkalemic which was felt to be the cause of her cardiac issues which was found to be secondary to missing dialysis sessions.  Admitted to the hospitalist service.  Nephrology consulted and patient underwent emergent dialysis.  Patient also seen by cardiology who recommended avoiding amiodarone and discontinuing Eliquis.  Echocardiogram done noted ejection fraction 30-35% with grade 3 diastolic dysfunction.    Patient seen in the emergency room today waiting for bed.  She status post dialysis previous day and is scheduled for later today.  Her only complaint is of fatigue.  Assessment/Plan: Principal Problem:   Cardiac arrest ([place]) secondary to hyperkalemia with electrolyte derangements brought on by missing dialysis: Getting dialysis again today. Active Problems:   ESRD (end stage renal disease) on dialysis [facility]): Appreciate nephrology help.    Hypertension: Blood pressure improved with dialysis.    Type 2 diabetes mellitus with hyperlipidemia ([place]): On sensitive sliding scale    Peripheral vascular disease ([facility]) status post bilateral BKA    Acute on chronic combined systolic (congestive) and diastolic (congestive) heart failure ([place]) with continued acute on chronic respiratory failure with hypoxia: Volume  is managed by dialysis.  Still requiring 5 L.  Seizure disorder: Continue Keppra    Obesity: Patient meets criteria BMI greater than 40    Hyperkalemia: Improved with initial dialysis  Paroxysmal atrial fibrillation: Cardiology is recommending that if there are any concerns of bleeding (as has been mentioned in the past) due to Eliquis, then perhaps it should be discontinued if the anemia from bleeding from Eliquis could worsen volume overload.  Currently stable this minute and no evidence of active bleeding.  Will decide prior to discharge.  Code Status: Full code  Family Communication: Left message for family  Disposition Plan: Return back to skilled nursing once oxygenation improved and no extra dialysis needed   Consultants:  Cardiology  Nephrology  Procedures:  Hemodialysis  Antimicrobials:  None  DVT prophylaxis: Currently on Eliquis although may change to subcu heparin  Level of care: Stepdown   Objective: Vitals:   05/15/20 1200 05/15/20 1230  BP: (!) 144/71 (!) 158/61  Pulse: 62 61  Resp: 16 11  Temp:    SpO2: 95% 97%   No intake or output data in the 24 hours ending 05/15/20 1336 Filed Weights   05/13/20 1914  Weight: 60 kg   Body mass index is 34.39 kg/m.  Exam:   General: Somnolent, fatigued, oriented x2  HEENT: Normocephalic and atraumatic, mucous membranes are slightly dry  Cardiovascular: Irregular rhythm, rate controlled  Respiratory: Poor inspiratory effort, few rales  Abdomen: Soft, nontender, nondistended, positive bowel sounds  Musculoskeletal: Status post bilateral BKA  Psychiatry: Appropriate, no evidence of psychoses   Data Reviewed: CBC: Recent Labs  Lab 05/10/20 0944 05/13/20 1803 05/14/20 0500 05/14/20 0858 05/15/20 0321  WBC 4.5 4.6 3.7* 3.7* 3.5*  NEUTROABS 3.3 3.2  --   --  2.3  HGB 9.5* 8.4* 8.0* 8.2* 8.2*  HCT 31.5* 28.5* 26.7* 27.0* 27.3*  MCV 100.6* 101.8* 100.0 100.4* 98.9  PLT 84* 71* 71* 74* 73*    Basic Metabolic Panel: Recent Labs  Lab 05/13/20 1803 05/13/20 2232 05/13/20 2233 05/14/20 0110 05/14/20 0500 05/14/20 0858 05/14/20 1541 05/14/20 2027 05/15/20 0321  NA 136  --  140  --  142 141  --   --  140  K 6.2*  --  6.4*   < > 5.8* 5.3* 4.0 4.0 4.2  CL 96*  --  97*  --  97* 99  --   --  94*  CO2 25  --  27  --  27 26  --   --  30  GLUCOSE 155*  --  140*  --  88 87  --   --  166*  BUN 75*  --  79*  --  83* 82*  --   --  45*  CREATININE 10.80*  --  10.99*  --  11.27* 11.67*  --   --  7.67*  CALCIUM 7.9*  --  8.3*  --  8.1* 7.9*  --   --  7.8*  MG  --  2.3  --   --   --   --   --   --  2.0  PHOS  --   --   --   --   --  5.9*  --   --  4.5   < > = values in this interval not displayed.   GFR: Estimated Creatinine Clearance: 4.4 mL/min (A) (by C-G formula based on SCr of 7.67 mg/dL (H)). Liver Function Tests: Recent Labs  Lab 05/10/20 0944 05/13/20 1803 05/13/20 2233 05/14/20 0858 05/15/20 0321  AST 21 24 20   --  16  ALT 27 19 17   --  14  ALKPHOS 88 75 71  --  69  BILITOT 0.8 0.8 0.7  --  0.7  PROT 6.9 6.8 6.6  --  6.5  ALBUMIN 3.5 3.2* 3.2* 3.0* 3.1*   No results for input(s): LIPASE, AMYLASE in the last 168 hours. No results for input(s): AMMONIA in the last 168 hours. Coagulation Profile: Recent Labs  Lab 05/10/20 0944  INR 1.7*   Cardiac Enzymes: Recent Labs  Lab 05/14/20 0110  CKTOTAL 153   BNP (last 3 results) No results for input(s): PROBNP in the last 8760 hours. HbA1C: No results for input(s): HGBA1C in the last 72 hours. CBG: Recent Labs  Lab 05/14/20 1349 05/14/20 1627 05/15/20 0209 05/15/20 0727 05/15/20 1156  GLUCAP 91 88 147* 149* 133*   Lipid Profile: No results for input(s): CHOL, HDL, LDLCALC, TRIG, CHOLHDL, LDLDIRECT in the last 72 hours. Thyroid Function Tests: No results for input(s): TSH, T4TOTAL, FREET4, T3FREE, THYROIDAB in the last 72 hours. Anemia Panel: Recent Labs    05/13/20 2233 05/15/20 0321  05/15/20 0511  VITAMINB12 2,095*  --   --   FOLATE 7.3 5.4*  --   FERRITIN  --  1,384*  --   TIBC  --  164*  --   IRON  --  52  --   RETICCTPCT  --   --  1.3   Urine analysis:    Component Value Date/Time   COLORURINE RED (A) 10/17/2016 1643   APPEARANCEUR TURBID (A) 10/17/2016 1643   LABSPEC 1.014 10/17/2016 1643  PHURINE  10/17/2016 1643    TEST NOT REPORTED DUE TO COLOR INTERFERENCE OF URINE PIGMENT   GLUCOSEU (A) 10/17/2016 1643    TEST NOT REPORTED DUE TO COLOR INTERFERENCE OF URINE PIGMENT   HGBUR (A) 10/17/2016 1643    TEST NOT REPORTED DUE TO COLOR INTERFERENCE OF URINE PIGMENT   BILIRUBINUR (A) 10/17/2016 1643    TEST NOT REPORTED DUE TO COLOR INTERFERENCE OF URINE PIGMENT   KETONESUR (A) 10/17/2016 1643    TEST NOT REPORTED DUE TO COLOR INTERFERENCE OF URINE PIGMENT   PROTEINUR (A) 10/17/2016 1643    TEST NOT REPORTED DUE TO COLOR INTERFERENCE OF URINE PIGMENT   NITRITE (A) 10/17/2016 1643    TEST NOT REPORTED DUE TO COLOR INTERFERENCE OF URINE PIGMENT   LEUKOCYTESUR (A) 10/17/2016 1643    TEST NOT REPORTED DUE TO COLOR INTERFERENCE OF URINE PIGMENT   Sepsis Labs: @LABRCNTIP (procalcitonin:4,lacticidven:4)  ) Recent Results (from the past 240 hour(s))  Resp Panel by RT-PCR (Flu A&B, Covid) Nasopharyngeal Swab     Status: None   Collection Time: 05/10/20  9:44 AM   Specimen: Nasopharyngeal Swab; Nasopharyngeal(NP) swabs in vial transport medium  Result Value Ref Range Status   SARS Coronavirus 2 by RT PCR NEGATIVE NEGATIVE Final    Comment: (NOTE) SARS-CoV-2 target nucleic acids are NOT DETECTED.  The SARS-CoV-2 RNA is generally detectable in upper respiratory specimens during the acute phase of infection. The lowest concentration of SARS-CoV-2 viral copies this assay can detect is 138 copies/mL. A negative result does not preclude SARS-Cov-2 infection and should not be used as the sole basis for treatment or other patient management decisions. A negative  result may occur with  improper specimen collection/handling, submission of specimen other than nasopharyngeal swab, presence of viral mutation(s) within the areas targeted by this assay, and inadequate number of viral copies(<138 copies/mL). A negative result must be combined with clinical observations, patient history, and epidemiological information. The expected result is Negative.  Fact Sheet for Patients:  EntrepreneurPulse.com.au  Fact Sheet for Healthcare Providers:  IncredibleEmployment.be  This test is no t yet approved or cleared by the [place] FDA and  has been authorized for detection and/or diagnosis of SARS-CoV-2 by FDA under an Emergency Use Authorization (EUA). This EUA will remain  in effect (meaning this test can be used) for the duration of the COVID-19 declaration under Section 564(b)(1) of the Act, 21 U.S.C.section 360bbb-3(b)(1), unless the authorization is terminated  or revoked sooner.       Influenza A by PCR NEGATIVE NEGATIVE Final   Influenza B by PCR NEGATIVE NEGATIVE Final    Comment: (NOTE) The Xpert Xpress SARS-CoV-2/FLU/RSV plus assay is intended as an aid in the diagnosis of influenza from Nasopharyngeal swab specimens and should not be used as a sole basis for treatment. Nasal washings and aspirates are unacceptable for Xpert Xpress SARS-CoV-2/FLU/RSV testing.  Fact Sheet for Patients: EntrepreneurPulse.com.au  Fact Sheet for Healthcare Providers: IncredibleEmployment.be  This test is not yet approved or cleared by the [place] FDA and has been authorized for detection and/or diagnosis of SARS-CoV-2 by FDA under an Emergency Use Authorization (EUA). This EUA will remain in effect (meaning this test can be used) for the duration of the COVID-19 declaration under Section 564(b)(1) of the Act, 21 U.S.C. section 360bbb-3(b)(1), unless the authorization is  terminated or revoked.  Performed at [facility], [address] [place], [place] [address]   SARS Coronavirus 2 by RT PCR (hospital order, performed  in [facility] lab) Nasopharyngeal Nasopharyngeal Swab     Status: None   Collection Time: 05/13/20  6:08 PM   Specimen: Nasopharyngeal Swab  Result Value Ref Range Status   SARS Coronavirus 2 NEGATIVE NEGATIVE Final    Comment: (NOTE) SARS-CoV-2 target nucleic acids are NOT DETECTED.  The SARS-CoV-2 RNA is generally detectable in upper and lower respiratory specimens during the acute phase of infection. The lowest concentration of SARS-CoV-2 viral copies this assay can detect is 250 copies / mL. A negative result does not preclude SARS-CoV-2 infection and should not be used as the sole basis for treatment or other patient management decisions.  A negative result may occur with improper specimen collection / handling, submission of specimen other than nasopharyngeal swab, presence of viral mutation(s) within the areas targeted by this assay, and inadequate number of viral copies (<250 copies / mL). A negative result must be combined with clinical observations, patient history, and epidemiological information.  Fact Sheet for Patients:   StrictlyIdeas.no  Fact Sheet for Healthcare Providers: BankingDealers.co.za  This test is not yet approved or  cleared by the [place] FDA and has been authorized for detection and/or diagnosis of SARS-CoV-2 by FDA under an Emergency Use Authorization (EUA).  This EUA will remain in effect (meaning this test can be used) for the duration of the COVID-19 declaration under Section 564(b)(1) of the Act, 21 U.S.C. section 360bbb-3(b)(1), unless the authorization is terminated or revoked sooner.  Performed at [facility], [address] [place], [place] [address]       Studies: ECHOCARDIOGRAM COMPLETE  Result  Date: 05/15/2020    ECHOCARDIOGRAM REPORT   Patient Name:   Stephanie Ellis Date of Exam: 05/15/2020 Medical Rec #:  [id number]     Height:       52.0 in Accession #:    [id number]    Weight:       132.3 lb Date of Birth:  1950/07/06    BSA:          1.412 m Patient Age:    20 years      BP:           119/68 mmHg Patient Gender: F             HR:           62 bpm. Exam Location:  [facility] Procedure: 2D Echo, Color Doppler, Cardiac Doppler and Strain Analysis Indications:     I46.9 Cardiac arrest  History:         Patient has prior history of Echocardiogram examinations.                  HFpEF, Prior CABG, PVD, ESRD; Risk Factors:Diabetes and HCL.  Sonographer:     Charmayne Sheer RDCS (AE) Referring Phys:  [id number] Victoriano Lain A THOMAS Diagnosing Phys: Neoma Laming MD  Sonographer Comments: Global longitudinal strain was attempted. IMPRESSIONS  1. Left ventricular ejection fraction, by estimation, is 30 to 35%. The left ventricle has severely decreased function. The left ventricle demonstrates global hypokinesis. The left ventricular internal cavity size was moderately dilated. There is severe  concentric left ventricular hypertrophy. Left ventricular diastolic parameters are consistent with Grade III diastolic dysfunction (restrictive).  2. Right ventricular systolic function is severely reduced. The right ventricular size is severely enlarged. Severely increased right ventricular wall thickness.  3. Left atrial size was severely dilated.  4. Right atrial size was severely dilated.  5. The pericardial effusion is circumferential.  6. The mitral valve is degenerative. Mild mitral valve regurgitation. Severe mitral annular calcification.  7. The tricuspid valve is degenerative.  8. The aortic valve is calcified. Aortic valve regurgitation is mild to moderate. Mild to moderate aortic valve sclerosis/calcification is present, without any evidence of aortic stenosis. FINDINGS  Left Ventricle: Left ventricular ejection fraction, by  estimation, is 30 to 35%. The left ventricle has severely decreased function. The left ventricle demonstrates global hypokinesis. The left ventricular internal cavity size was moderately dilated. There is severe concentric left ventricular hypertrophy. Left ventricular diastolic parameters are consistent with Grade III diastolic dysfunction (restrictive). Right Ventricle: The right ventricular size is severely enlarged. Severely increased right ventricular wall thickness. Right ventricular systolic function is severely reduced. Left Atrium: Left atrial size was severely dilated. Right Atrium: Right atrial size was severely dilated. Pericardium: Trivial pericardial effusion is present. The pericardial effusion is circumferential. Mitral Valve: The mitral valve is degenerative in appearance. Severe mitral annular calcification. Mild mitral valve regurgitation. MV peak gradient, 8.2 mmHg. The mean mitral valve gradient is 3.0 mmHg. Tricuspid Valve: The tricuspid valve is degenerative in appearance. Tricuspid valve regurgitation is mild. Aortic Valve: The aortic valve is calcified. Aortic valve regurgitation is mild to moderate. Mild to moderate aortic valve sclerosis/calcification is present, without any evidence of aortic stenosis. Aortic valve mean gradient measures 3.0 mmHg. Aortic valve peak gradient measures 6.7 mmHg. Aortic valve area, by VTI measures 1.72 cm. Pulmonic Valve: The pulmonic valve was not well visualized. Pulmonic valve regurgitation is not visualized. Aorta: The aortic root was not well visualized. IAS/Shunts: No atrial level shunt detected by color flow Doppler.  LEFT VENTRICLE PLAX 2D LVIDd:         4.09 cm     Diastology LVIDs:         3.42 cm     LV e' medial:    3.37 cm/s LV PW:         1.37 cm     LV E/e' medial:  45.1 LV IVS:        1.30 cm     LV e' lateral:   5.44 cm/s LVOT diam:     1.90 cm     LV E/e' lateral: 27.9 LV SV:         45 LV SV Index:   32 LVOT Area:     2.84 cm  LV Volumes  (MOD) LV vol d, MOD A2C: 56.7 ml LV vol d, MOD A4C: 80.3 ml LV vol s, MOD A2C: 29.3 ml LV vol s, MOD A4C: 36.7 ml LV SV MOD A2C:     27.4 ml LV SV MOD A4C:     80.3 ml LV SV MOD BP:      35.9 ml RIGHT VENTRICLE RV Basal diam:  3.87 cm LEFT ATRIUM             Index       RIGHT ATRIUM           Index LA diam:        4.00 cm 2.83 cm/m  RA Area:     12.20 cm LA Vol (A2C):   54.3 ml 38.46 ml/m RA Volume:   27.20 ml  19.27 ml/m LA Vol (A4C):   28.8 ml 20.40 ml/m LA Biplane Vol: 43.2 ml 30.60 ml/m  AORTIC VALVE AV Area (Vmax):    1.66 cm AV Area (Vmean):   1.72 cm AV Area (VTI):     1.72 cm AV Vmax:  129.00 cm/s AV Vmean:          82.600 cm/s AV VTI:            0.262 m AV Peak Grad:      6.7 mmHg AV Mean Grad:      3.0 mmHg LVOT Vmax:         75.50 cm/s LVOT Vmean:        50.100 cm/s LVOT VTI:          0.159 m LVOT/AV VTI ratio: 0.61  AORTA Ao Root diam: 2.70 cm MITRAL VALVE                TRICUSPID VALVE MV Area (PHT): 3.26 cm     TR Peak grad:   37.2 mmHg MV Area VTI:   1.07 cm     TR Vmax:        305.00 cm/s MV Peak grad:  8.2 mmHg MV Mean grad:  3.0 mmHg     SHUNTS MV Vmax:       1.43 m/s     Systemic VTI:  0.16 m MV Vmean:      74.7 cm/s    Systemic Diam: 1.90 cm MV Decel Time: 233 msec MV E velocity: 152.00 cm/s MV A velocity: 130.00 cm/s MV E/A ratio:  1.17 Neoma Laming MD Electronically signed by Neoma Laming MD Signature Date/Time: 05/15/2020/12:30:54 PM    Final     Scheduled Meds: . apixaban  2.5 mg Oral BID  . aspirin EC  81 mg Oral Daily  . atorvastatin  80 mg Oral QHS  . brimonidine  1 drop Both Eyes TID  . carvedilol  25 mg Oral BID WC  . Chlorhexidine Gluconate Cloth  6 each Topical Q0600  . epoetin (EPOGEN/PROCRIT) injection  10,000 Units Intravenous Q M,W,F-HD  . fluticasone  2 spray Each Nare Daily  . insulin aspart  0-6 Units Subcutaneous TID WC  . isosorbide mononitrate  60 mg Oral Daily  . levETIRAcetam  250 mg Oral Q M,W,F  . levETIRAcetam  500 mg Oral BID  .  mirtazapine  7.5 mg Oral Daily  . pantoprazole  40 mg Oral Daily  . sevelamer carbonate  1,600 mg Oral TID WC    Continuous Infusions:   LOS: 2 days     Annita Brod, MD [facility]   05/15/2020, 1:36 PM

## 2020-05-15 NOTE — Progress Notes (Signed)
[facility]  ROUNDING NOTE   Subjective:   [name] was admitted to [facility] on 05/13/2020 for Cardiac arrest [facility]) [I46.9]  Hemodialysis treatment yesterday. Syncopal episode on dialysis treatment yesterday. UF of 531m.     HEMODIALYSIS FLOWSHEET:  Blood Flow Rate (mL/min): 200 mL/min Arterial Pressure (mmHg): -80 mmHg Venous Pressure (mmHg): 70 mmHg Transmembrane Pressure (mmHg): 30 mmHg Ultrafiltration Rate (mL/min): 70 mL/min Dialysate Flow Rate (mL/min): 600 ml/min Conductivity: Machine : 13.8 Conductivity: Machine : 13.8 Dialysis Fluid Bolus: Normal Saline Bolus Amount (mL): 250 mL      Objective:  Vital signs in last 24 hours:  Pulse Rate:  [59-64] 62 (01/24 1400) Resp:  [7-19] 10 (01/24 1400) BP: (118-187)/(46-144) 168/65 (01/24 1400) SpO2:  [95 %-100 %] 100 % (01/24 1400)  Weight change:  Filed Weights   05/13/20 1914  Weight: 60 kg    Intake/Output: I/O last 3 completed shifts: In: -  Out: 1095 [Urine:500; Other:595]   Intake/Output this shift:  No intake/output data recorded.  Physical Exam: General: NAD, laying in bed  Head: Normocephalic, atraumatic. Moist oral mucosal membranes  Eyes: Anicteric, PERRL  Neck: Supple, trachea midline  Lungs:  Clear to auscultation  Heart: Regular rate and rhythm  Abdomen:  Soft, nontender,   Extremities: Bilateral BKA  Neurologic: Nonfocal, moving all four extremities  Skin: No lesions  Access: RIJ permcath    Basic Metabolic Panel: Recent Labs  Lab 05/13/20 1803 05/13/20 2232 05/13/20 2233 05/14/20 0110 05/14/20 0500 05/14/20 0858 05/14/20 1541 05/14/20 2027 05/15/20 0321  NA 136  --  140  --  142 141  --   --  140  K 6.2*  --  6.4*   < > 5.8* 5.3* 4.0 4.0 4.2  CL 96*  --  97*  --  97* 99  --   --  94*  CO2 25  --  27  --  27 26  --   --  30  GLUCOSE 155*  --  140*  --  88 87  --   --  166*  BUN 75*  --  79*  --  83* 82*  --   --  45*  CREATININE 10.80*  --  10.99*  --   11.27* 11.67*  --   --  7.67*  CALCIUM 7.9*  --  8.3*  --  8.1* 7.9*  --   --  7.8*  MG  --  2.3  --   --   --   --   --   --  2.0  PHOS  --   --   --   --   --  5.9*  --   --  4.5   < > = values in this interval not displayed.    Liver Function Tests: Recent Labs  Lab 05/10/20 0944 05/13/20 1803 05/13/20 2233 05/14/20 0858 05/15/20 0321  AST 21 24 20   --  16  ALT 27 19 17   --  14  ALKPHOS 88 75 71  --  69  BILITOT 0.8 0.8 0.7  --  0.7  PROT 6.9 6.8 6.6  --  6.5  ALBUMIN 3.5 3.2* 3.2* 3.0* 3.1*   No results for input(s): LIPASE, AMYLASE in the last 168 hours. No results for input(s): AMMONIA in the last 168 hours.  CBC: Recent Labs  Lab 05/10/20 0944 05/13/20 1803 05/14/20 0500 05/14/20 0858 05/15/20 0321  WBC 4.5 4.6 3.7* 3.7* 3.5*  NEUTROABS 3.3 3.2  --   --  2.3  HGB 9.5* 8.4* 8.0* 8.2* 8.2*  HCT 31.5* 28.5* 26.7* 27.0* 27.3*  MCV 100.6* 101.8* 100.0 100.4* 98.9  PLT 84* 71* 71* 74* 73*    Cardiac Enzymes: Recent Labs  Lab 05/14/20 0110  CKTOTAL 153    BNP: Invalid input(s): POCBNP  CBG: Recent Labs  Lab 05/14/20 1349 05/14/20 1627 05/15/20 0209 05/15/20 0727 05/15/20 1156  GLUCAP 91 88 147* 149* 133*    Microbiology: Results for orders placed or performed during the hospital encounter of 05/13/20  SARS Coronavirus 2 by RT PCR (hospital order, performed in [place] hospital lab) Nasopharyngeal Nasopharyngeal Swab     Status: None   Collection Time: 05/13/20  6:08 PM   Specimen: Nasopharyngeal Swab  Result Value Ref Range Status   SARS Coronavirus 2 NEGATIVE NEGATIVE Final    Comment: (NOTE) SARS-CoV-2 target nucleic acids are NOT DETECTED.  The SARS-CoV-2 RNA is generally detectable in upper and lower respiratory specimens during the acute phase of infection. The lowest concentration of SARS-CoV-2 viral copies this assay can detect is 250 copies / mL. A negative result does not preclude SARS-CoV-2 infection and should not be used as  the sole basis for treatment or other patient management decisions.  A negative result may occur with improper specimen collection / handling, submission of specimen other than nasopharyngeal swab, presence of viral mutation(s) within the areas targeted by this assay, and inadequate number of viral copies (<250 copies / mL). A negative result must be combined with clinical observations, patient history, and epidemiological information.  Fact Sheet for Patients:   StrictlyIdeas.no  Fact Sheet for Healthcare Providers: BankingDealers.co.za  This test is not yet approved or  cleared by the [place] FDA and has been authorized for detection and/or diagnosis of SARS-CoV-2 by FDA under an Emergency Use Authorization (EUA).  This EUA will remain in effect (meaning this test can be used) for the duration of the COVID-19 declaration under Section 564(b)(1) of the Act, 21 U.S.C. section 360bbb-3(b)(1), unless the authorization is terminated or revoked sooner.  Performed at [facility], [address] [place], [place] [address]     Coagulation Studies: No results for input(s): LABPROT, INR in the last 72 hours.  Urinalysis: No results for input(s): COLORURINE, LABSPEC, PHURINE, GLUCOSEU, HGBUR, BILIRUBINUR, KETONESUR, PROTEINUR, UROBILINOGEN, NITRITE, LEUKOCYTESUR in the last 72 hours.  Invalid input(s): APPERANCEUR    Imaging: DG Chest Portable 1 View  Result Date: 05/13/2020 CLINICAL DATA:  70 year old female status post cardiac arrest. EXAM: PORTABLE CHEST 1 VIEW COMPARISON:  Chest radiograph dated 05/10/2020. FINDINGS: Dialysis catheter in similar position. There is cardiomegaly with vascular congestion and small bilateral pleural effusions. No pneumothorax. Median sternotomy wires and CABG vascular clips. Atherosclerotic calcification of the aorta. No acute osseous pathology. IMPRESSION: Cardiomegaly with vascular  congestion and small bilateral pleural effusions. Electronically Signed   By: Anner Crete M.D.   On: 05/13/2020 19:12   ECHOCARDIOGRAM COMPLETE  Result Date: 05/15/2020    ECHOCARDIOGRAM REPORT   Patient Name:   Stephanie Ellis Date of Exam: 05/15/2020 Medical Rec #:  [id number]     Height:       52.0 in Accession #:    [id number]    Weight:       132.3 lb Date of Birth:  January 15, 1951    BSA:          1.412 m Patient Age:    76 years      BP:  119/68 mmHg Patient Gender: F             HR:           62 bpm. Exam Location:  [facility] Procedure: 2D Echo, Color Doppler, Cardiac Doppler and Strain Analysis Indications:     I46.9 Cardiac arrest  History:         Patient has prior history of Echocardiogram examinations.                  HFpEF, Prior CABG, PVD, ESRD; Risk Factors:Diabetes and HCL.  Sonographer:     Charmayne Sheer RDCS (AE) Referring Phys:  [id number] Victoriano Lain A THOMAS Diagnosing Phys: Neoma Laming MD  Sonographer Comments: Global longitudinal strain was attempted. IMPRESSIONS  1. Left ventricular ejection fraction, by estimation, is 30 to 35%. The left ventricle has severely decreased function. The left ventricle demonstrates global hypokinesis. The left ventricular internal cavity size was moderately dilated. There is severe  concentric left ventricular hypertrophy. Left ventricular diastolic parameters are consistent with Grade III diastolic dysfunction (restrictive).  2. Right ventricular systolic function is severely reduced. The right ventricular size is severely enlarged. Severely increased right ventricular wall thickness.  3. Left atrial size was severely dilated.  4. Right atrial size was severely dilated.  5. The pericardial effusion is circumferential.  6. The mitral valve is degenerative. Mild mitral valve regurgitation. Severe mitral annular calcification.  7. The tricuspid valve is degenerative.  8. The aortic valve is calcified. Aortic valve regurgitation is mild to moderate. Mild to  moderate aortic valve sclerosis/calcification is present, without any evidence of aortic stenosis. FINDINGS  Left Ventricle: Left ventricular ejection fraction, by estimation, is 30 to 35%. The left ventricle has severely decreased function. The left ventricle demonstrates global hypokinesis. The left ventricular internal cavity size was moderately dilated. There is severe concentric left ventricular hypertrophy. Left ventricular diastolic parameters are consistent with Grade III diastolic dysfunction (restrictive). Right Ventricle: The right ventricular size is severely enlarged. Severely increased right ventricular wall thickness. Right ventricular systolic function is severely reduced. Left Atrium: Left atrial size was severely dilated. Right Atrium: Right atrial size was severely dilated. Pericardium: Trivial pericardial effusion is present. The pericardial effusion is circumferential. Mitral Valve: The mitral valve is degenerative in appearance. Severe mitral annular calcification. Mild mitral valve regurgitation. MV peak gradient, 8.2 mmHg. The mean mitral valve gradient is 3.0 mmHg. Tricuspid Valve: The tricuspid valve is degenerative in appearance. Tricuspid valve regurgitation is mild. Aortic Valve: The aortic valve is calcified. Aortic valve regurgitation is mild to moderate. Mild to moderate aortic valve sclerosis/calcification is present, without any evidence of aortic stenosis. Aortic valve mean gradient measures 3.0 mmHg. Aortic valve peak gradient measures 6.7 mmHg. Aortic valve area, by VTI measures 1.72 cm. Pulmonic Valve: The pulmonic valve was not well visualized. Pulmonic valve regurgitation is not visualized. Aorta: The aortic root was not well visualized. IAS/Shunts: No atrial level shunt detected by color flow Doppler.  LEFT VENTRICLE PLAX 2D LVIDd:         4.09 cm     Diastology LVIDs:         3.42 cm     LV e' medial:    3.37 cm/s LV PW:         1.37 cm     LV E/e' medial:  45.1 LV IVS:         1.30 cm     LV e' lateral:   5.44 cm/s LVOT diam:  1.90 cm     LV E/e' lateral: 27.9 LV SV:         45 LV SV Index:   32 LVOT Area:     2.84 cm  LV Volumes (MOD) LV vol d, MOD A2C: 56.7 ml LV vol d, MOD A4C: 80.3 ml LV vol s, MOD A2C: 29.3 ml LV vol s, MOD A4C: 36.7 ml LV SV MOD A2C:     27.4 ml LV SV MOD A4C:     80.3 ml LV SV MOD BP:      35.9 ml RIGHT VENTRICLE RV Basal diam:  3.87 cm LEFT ATRIUM             Index       RIGHT ATRIUM           Index LA diam:        4.00 cm 2.83 cm/m  RA Area:     12.20 cm LA Vol (A2C):   54.3 ml 38.46 ml/m RA Volume:   27.20 ml  19.27 ml/m LA Vol (A4C):   28.8 ml 20.40 ml/m LA Biplane Vol: 43.2 ml 30.60 ml/m  AORTIC VALVE AV Area (Vmax):    1.66 cm AV Area (Vmean):   1.72 cm AV Area (VTI):     1.72 cm AV Vmax:           129.00 cm/s AV Vmean:          82.600 cm/s AV VTI:            0.262 m AV Peak Grad:      6.7 mmHg AV Mean Grad:      3.0 mmHg LVOT Vmax:         75.50 cm/s LVOT Vmean:        50.100 cm/s LVOT VTI:          0.159 m LVOT/AV VTI ratio: 0.61  AORTA Ao Root diam: 2.70 cm MITRAL VALVE                TRICUSPID VALVE MV Area (PHT): 3.26 cm     TR Peak grad:   37.2 mmHg MV Area VTI:   1.07 cm     TR Vmax:        305.00 cm/s MV Peak grad:  8.2 mmHg MV Mean grad:  3.0 mmHg     SHUNTS MV Vmax:       1.43 m/s     Systemic VTI:  0.16 m MV Vmean:      74.7 cm/s    Systemic Diam: 1.90 cm MV Decel Time: 233 msec MV E velocity: 152.00 cm/s MV A velocity: 130.00 cm/s MV E/A ratio:  1.17 Neoma Laming MD Electronically signed by Neoma Laming MD Signature Date/Time: 05/15/2020/12:30:54 PM    Final      Medications:    . apixaban  2.5 mg Oral BID  . aspirin EC  81 mg Oral Daily  . atorvastatin  80 mg Oral QHS  . brimonidine  1 drop Both Eyes TID  . carvedilol  25 mg Oral BID WC  . Chlorhexidine Gluconate Cloth  6 each Topical Q0600  . epoetin (EPOGEN/PROCRIT) injection  10,000 Units Intravenous Q M,W,F-HD  . fluticasone  2 spray Each Nare Daily  . insulin  aspart  0-6 Units Subcutaneous TID WC  . isosorbide mononitrate  60 mg Oral Daily  . levETIRAcetam  250 mg Oral Q M,W,F  . levETIRAcetam  500 mg Oral BID  . mirtazapine  7.5 mg Oral Daily  . pantoprazole  40 mg Oral Daily  . sevelamer carbonate  1,600 mg Oral TID WC   acetaminophen, docusate sodium, ipratropium-albuterol, ondansetron (ZOFRAN) IV, polyethylene glycol  Assessment/ Plan:  [name] is a 70 y.o. black female with end stage renal disease on hemodialysis, hypertension, diabetes mellitus type II, coronary artery disease status post CABG, peripheral vascular disease status post bilateral lower extremity amputations, GERD, congestive heart failure, hyperlipidemia, who was admitted to [facility] on 05/13/2020 for Cardiac arrest [facility]) [I46.9]  [facility] MWF [facility] [address] RIJ permcath 52kg  1. End Stage Renal Disease on hemodialysis - dialysis for today.   2. Anemia of chronic kidney disease: hemoglobin 8.2, macrocytic. Gets mircera as outpatient.  EPO with MWF schedule.   3. Secondary Hyperparathyroidism:  - sevelamer with meals.   4. Hypertension: with history of hypotension on dialysis treatments. Home regimen of hydralazine, carvedilol, losartan and isosorbide mononitrate.    LOS: 2 Doc [place] 1/24/20222:33 PM

## 2020-05-15 NOTE — ED Notes (Signed)
Patient given two warm blankets

## 2020-05-15 NOTE — ED Notes (Signed)
Patient to dialysis at this time.

## 2020-05-15 NOTE — ED Notes (Signed)
Pt stating she feels sweaty, requesting this RN to check glucose.

## 2020-05-15 NOTE — Progress Notes (Signed)
*  PRELIMINARY RESULTS* Echocardiogram 2D Echocardiogram has been performed.  Stephanie Ellis 05/15/2020, 9:03 AM

## 2020-05-15 NOTE — ED Notes (Signed)
Report given to dialysis.   

## 2020-05-15 NOTE — ED Notes (Signed)
Echo at bedside

## 2020-05-16 LAB — BASIC METABOLIC PANEL
Anion gap: 14 (ref 5–15)
BUN: 24 mg/dL — ABNORMAL HIGH (ref 8–23)
CO2: 27 mmol/L (ref 22–32)
Calcium: 7.8 mg/dL — ABNORMAL LOW (ref 8.9–10.3)
Chloride: 98 mmol/L (ref 98–111)
Creatinine, Ser: 5.24 mg/dL — ABNORMAL HIGH (ref 0.44–1.00)
GFR, Estimated: 8 mL/min — ABNORMAL LOW (ref >60)
Glucose, Bld: 135 mg/dL — ABNORMAL HIGH (ref 70–99)
Potassium: 3.6 mmol/L (ref 3.5–5.1)
Sodium: 139 mmol/L (ref 135–145)

## 2020-05-16 LAB — CBG MONITORING, ED
Glucose-Capillary: 124 mg/dL — ABNORMAL HIGH (ref 70–99)
Glucose-Capillary: 128 mg/dL — ABNORMAL HIGH (ref 70–99)

## 2020-05-16 NOTE — ED Notes (Signed)
DC reviewed with transport. Caryl Pina at [facility] notified that patient en route. Patient last observed awake alert in NAD.

## 2020-05-16 NOTE — NC FL2 (Addendum)
[place] LEVEL OF CARE SCREENING TOOL     IDENTIFICATION  Patient Name: Stephanie Ellis Birthdate: [birth year] Sex: female Admission Date (Current Location): 05/13/2020  [place] and [place] Number:  Stephanie Ellis [id number] [place] and Address:  [facility], [address] [place], Stephanie Ellis [address]      Provider Number: [id number]  Attending Physician Name and Address:  Lorella Nimrod, MD  Relative Name and Phone Number:  Stephanie Ellis (Daughter)   [phone]    Current Level of Care: Hospital Recommended Level of Care: [place] Prior Approval Number:    Date Approved/Denied:   PASRR Number:  [id number]  Discharge Plan: Other (Comment) ([facility] long-term [facility])    Current Diagnoses: Patient Active Problem List   Diagnosis Date Noted  . Dialysis patient, noncompliant ([place]) 05/15/2020  . Hyperkalemia 05/15/2020  . Cardiac arrest ([place]) 05/13/2020  . AMS (altered mental status) 04/10/2020  . Chronic combined systolic and diastolic CHF (congestive heart failure) ([place]) 04/02/2020  . Thrombocytopenia ([place]) 04/02/2020  . Chronic respiratory failure with hypoxia ([place]) 11/03/2019  . Nocturnal hypoxia   . Shortness of breath   . Abdominal swelling   . Acute on chronic systolic CHF (congestive heart failure) ([place])   . Acute respiratory failure with hypoxia ([place]) 09/02/2019  . Acute on chronic combined systolic (congestive) and diastolic (congestive) heart failure ([place]) 09/02/2019  . 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 Severe anemia 05/01/2019  . Resides in skilled nursing facility 12/29/2018  . CVA (cerebral vascular accident) ([place]) 07/08/2018  . Infection of amputation stump ([place]) 05/02/2018  . BKA stump complication ([place]) 23/76/2831  . Unilateral complete BKA, right, initial encounter ([place]) 03/30/2018  . Transient loss of consciousness 03/19/2018  . Transient alteration of awareness   . VT (ventricular tachycardia)  ([place])   . Disorientation   . Hypertensive urgency 03/08/2018  . Status post below-knee amputation of both lower extremities ([place]) 03/02/2018  . Ischemia of extremity 02/18/2018  . CKD (chronic kidney disease) stage 5, GFR less than 15 ml/min (HCC) 02/16/2018  . Diabetic osteomyelitis ([place])   . Peripheral vascular disease ([place])   . Advanced care planning/counseling discussion   . Palliative care by specialist   . Goals of care, counseling/discussion   . Type 2 diabetes mellitus with hyperlipidemia ([place]) 01/30/2018  . Peripheral neuropathy 06/17/2017  . Seizure disorder ([place]) 02/06/2017  . Accelerated hypertension 02/06/2017  . Endophthalmitis, left eye 12/05/2016  . Vomiting 10/24/2016  . Hematuria 10/17/2016  . Anesthesia complication 51/76/1607  . Chest pain 09/04/2016  . Steal syndrome dialysis vascular access, initial encounter ([place]) 06/18/2016  . 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 Coronary artery disease involving coronary bypass graft of native heart with angina pectoris ([place]) 02/10/2016  . Hypertension 02/02/2016  . Hyperlipidemia 02/02/2016  . Coronary artery disease involving left main coronary artery 10/04/2015  . Abdominal pain of unknown etiology 10/04/2015  . ESRD (end stage renal disease) on dialysis ([place])   . Type II diabetes mellitus with complication ([place])   . Right sided abdominal pain   . Elevated troponin 10/03/2015  . 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 Acute on chronic combined systolic and diastolic CHF (congestive heart failure) ([place]) 04/19/2014  . Anemia 09/20/2013  . Routine health maintenance 01/29/2013    Orientation RESPIRATION BLADDER Height & Weight     Self,Situation,Place,Time  Normal   Weight: 132 lb 4.4 oz (60 kg) Height:     BEHAVIORAL SYMPTOMS/MOOD NEUROLOGICAL BOWEL NUTRITION STATUS  Diet  AMBULATORY STATUS COMMUNICATION OF NEEDS Skin   Limited Assist   Normal                       Personal Care  Assistance Level of Assistance  Bathing,Dressing,Total care Bathing Assistance: Limited assistance   Dressing Assistance: Limited assistance Total Care Assistance: Limited assistance   Functional Limitations Info  Sight,Hearing,Speech Sight Info: Adequate Hearing Info: Adequate Speech Info: Adequate    SPECIAL CARE FACTORS FREQUENCY                       Contractures Contractures Info: Not present (bilateral BKA)    Additional Factors Info                  Current Medications (05/16/2020):  This is the current hospital active medication list Current Facility-Administered Medications  Medication Dose Route Frequency Provider Last Rate Last Admin  . acetaminophen (TYLENOL) tablet 650 mg  650 mg Oral Q4H PRN Clance Boll, MD      . apixaban Arne [place]) tablet 2.5 mg  2.5 mg Oral BID Raiford Noble [place], DO   2.5 mg at 05/16/20 1123  . aspirin EC tablet 81 mg  81 mg Oral Daily Raiford Noble [place], DO   81 mg at 05/16/20 1122  . atorvastatin (LIPITOR) tablet 80 mg  80 mg Oral QHS Raiford Noble [place], DO   80 mg at 05/15/20 2053  . brimonidine (ALPHAGAN) 0.2 % ophthalmic solution 1 drop  1 drop Both Eyes TID Raiford Noble [place], DO   1 drop at 05/16/20 1123  . carvedilol (COREG) tablet 25 mg  25 mg Oral BID WC Myles Rosenthal A, MD   25 mg at 05/16/20 1122  . Chlorhexidine Gluconate Cloth 2 % PADS 6 each  6 each Topical Q0600 Kolluru, Sarath, MD      . docusate sodium (COLACE) capsule 100 mg  100 mg Oral BID PRN Myles Rosenthal A, MD      . epoetin alfa (EPOGEN) injection 10,000 Units  10,000 Units Intravenous Q M,W,F-HD Kolluru, Sarath, MD      . fluticasone (FLONASE) 50 MCG/ACT nasal spray 2 spray  2 spray Each Nare Daily Raiford Noble [place], DO   2 spray at 05/15/20 0830  . insulin aspart (novoLOG) injection 0-6 Units  0-6 Units Subcutaneous TID WC Myles Rosenthal A, MD      . ipratropium-albuterol (DUONEB) 0.5-2.5 (3) MG/3ML nebulizer solution 3 mL  3 mL  Nebulization Q6H PRN Clance Boll, MD      . isosorbide mononitrate (IMDUR) 24 hr tablet 60 mg  60 mg Oral Daily Raiford Noble [place], DO   60 mg at 05/16/20 1122  . levETIRAcetam (KEPPRA) tablet 250 mg  250 mg Oral Q M,W,F Raiford Noble [employer], DO   250 mg at 05/15/20 1032  . levETIRAcetam (KEPPRA) tablet 500 mg  500 mg Oral BID Myles Rosenthal A, MD   500 mg at 05/16/20 1123  . mirtazapine (REMERON) tablet 7.5 mg  7.5 mg Oral Daily Raiford Noble [place], DO   7.5 mg at 05/16/20 1122  . ondansetron (ZOFRAN) injection 4 mg  4 mg Intravenous Q6H PRN Myles Rosenthal A, MD      . pantoprazole (PROTONIX) EC tablet 40 mg  40 mg Oral Daily Myles Rosenthal A, MD   40 mg at 05/16/20 1122  . polyethylene glycol (MIRALAX / GLYCOLAX) packet 17 g  17 g Oral Daily PRN  Clance Boll, MD      . sevelamer carbonate (RENVELA) tablet 1,600 mg  1,600 mg Oral TID WC Sheikh, Omair [place], DO   1,600 mg at 05/16/20 1133   Current Outpatient Medications  Medication Sig Dispense Refill  . amoxicillin-clavulanate (AUGMENTIN) 875-125 MG tablet Take 1 tablet by mouth 2 (two) times daily.    Marland Kitchen apixaban (ELIQUIS) 2.5 MG TABS tablet Take 1 tablet (2.5 mg total) by mouth 2 (two) times daily. 60 tablet 0  . aspirin EC 81 MG tablet Take 1 tablet (81 mg total) by mouth daily.    Marland Kitchen atorvastatin (LIPITOR) 80 MG tablet Take 80 mg by mouth at bedtime.     . brimonidine (ALPHAGAN) 0.2 % ophthalmic solution Place 1 drop into both eyes 3 (three) times daily. 5 mL 12  . carvedilol (COREG) 12.5 MG tablet Take 12.5 mg by mouth 2 (two) times daily. Take in combination with 25 mg for total of 37.5 mg twice daily    . carvedilol (COREG) 25 MG tablet Take 1 tablet (25 mg total) by mouth 2 (two) times daily. 60 tablet 0  . fluticasone (FLONASE) 50 MCG/ACT nasal spray Place 2 sprays into both nostrils daily.    . hydrALAZINE (APRESOLINE) 25 MG tablet Take 3 tablets (75 mg total) by mouth every 8 (eight) hours.    . insulin aspart  (NOVOLOG) 100 UNIT/ML injection Inject 0-6 Units into the skin See admin instructions. Inject under the skin according to blood glucose reading:  <201: 0u 201- 250: 2u 251-300: 4u 301-350: 5u 351-400: 6u    . insulin lispro (HUMALOG) 100 UNIT/ML KwikPen Inject 2-6 Units into the skin 3 (three) times daily before meals.    Marland Kitchen ipratropium (ATROVENT) 0.06 % nasal spray Place 2 sprays into both nostrils 2 (two) times daily. 15 mL 0  . isosorbide mononitrate (IMDUR) 60 MG 24 hr tablet Take 1 tablet (60 mg total) by mouth daily.    Marland Kitchen levETIRAcetam (KEPPRA) 250 MG tablet Three times weekly after dialysis (Patient taking differently: Take 250 mg by mouth every Monday, Wednesday, and Friday.)    . levETIRAcetam (KEPPRA) 500 MG tablet Take 500 mg by mouth 2 (two) times daily.    Marland Kitchen losartan (COZAAR) 50 MG tablet Take 1 tablet (50 mg total) by mouth daily.    . meclizine (ANTIVERT) 25 MG tablet Take 25 mg by mouth every 4 (four) hours as needed for dizziness.    . mirtazapine (REMERON) 15 MG tablet Take 7.5 mg by mouth daily.     . Omega-3 1000 MG CAPS Take 2 capsules by mouth daily.     . pantoprazole (PROTONIX) 40 MG tablet Take 1 tablet (40 mg total) by mouth daily. 30 tablet 11  . Polyvinyl Alcohol-Povidone PF (REFRESH) 1.4-0.6 % SOLN Place 1 drop into both eyes 2 (two) times a day.     Marland Kitchen rOPINIRole (REQUIP) 0.25 MG tablet Take 0.25 mg by mouth at bedtime.    . senna-docusate (SENOKOT-S) 8.6-50 MG tablet Take 1 tablet by mouth 2 (two) times daily.    . sevelamer carbonate (RENVELA) 800 MG tablet 2 Tablet(s) By Mouth 3 Times Daily    . thiamine 100 MG tablet Take 1 tablet (100 mg total) by mouth daily.    . timolol (TIMOPTIC) 0.5 % ophthalmic solution Place 1 drop into both eyes 3 (three) times daily. 10 mL 12  . triamcinolone cream (KENALOG) 0.1 % Apply 1 application topically daily as needed (dry skin).    Marland Kitchen  vitamin B-12 (CYANOCOBALAMIN) 1000 MCG tablet Take 1 tablet (1,000 mcg total) by mouth daily.  30 tablet 0     Discharge Medications: Please see discharge summary for a list of discharge medications.  Relevant Imaging Results:  Relevant Lab Results:   Additional Information SS# [id number]  Adelene Amas, LCSWA

## 2020-05-16 NOTE — Final Consult Note (Signed)
[facility] Consultation Note  Patient ID: Stephanie Ellis, MRN: [id number], DOB/AGE: 1950-06-24 70 y.o. Admit date: 05/13/2020   Date of Consult: 05/16/2020   Patient overall has tolerated dialysis quite well and has had improvements in current condition.  No evidence of rhythm disturbances since admission.  Patient has remained in normal sinus rhythm despite history of atrial fibrillation.  Troponin level has peaked out at 120/145/129 without evidence of myocardial infarction and/or acute coronary syndrome.  There is been no evidence of anginal symptoms or acute coronary syndrome type symptoms or other cardiovascular concerns today.  Past Medical History:  Diagnosis Date  . (HFpEF) heart failure with preserved ejection fraction ([place])    a. 01/2018 Echo: EF 50-55%, no rwma, Gr1 DD, triv AI, mild MR. Midly dil LA/RV, mod reduced RV fxn. Irreg thickening of TV w/ mobile echodensity that appears to arise from valve.  . Anemia   . Anorexia   . Bacteremia due to Pseudomonas   . Carotid arterial disease ([place])    a. 01/2017 Carotid U/S: RICA <38, LICA <93.  Marland Kitchen CHF (congestive heart failure) ([place])   . Complication of anesthesia    a. Pt reports h/o complication on 9 different occasions - ? hypotension/arrest.  . 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 01/2015 Cath [facility]): 70% LM, p-mLAD 50-60% (Resting FFR 0.75), mRCA 80-90%, ~40 Ost OM & D1-->CABG; b. 01/2016 Staged PCI of LCX x 2 and RCA.  Marland Kitchen ESRD (end stage renal disease) on dialysis ([place])    a. ESRD secondary to acute kidney failure s/p CABG-->PD  . Essential hypertension   . GERD (gastroesophageal reflux disease)   . Heart murmur   . Hypercholesterolemia   . Myocardial infarction ([place]) 2016  . PVD (peripheral vascular disease) ([place])    a. 01/30/2018 PV Angio: Sev Left Popliteal dzs s/p PTA w/ 68m lutonix DEB w/ mech aspiration of the L popliteal, L AT, and Left tibioperoneal trunck and peroneal  artery.  . 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  [facility]: Dr. BMarland KitchenHaithcock: CABG x 3, LIMA to LAD, SVG to RCA, SVG to OM3, EVH  . Sinusitis 2019  . Type II diabetes mellitus with complication ([facility]    CAD      Surgical History:  Past Surgical History:  Procedure Laterality Date  . ABDOMINAL HYSTERECTOMY    . AMPUTATION Right 02/25/2018   Procedure: AMPUTATION BELOW KNEE;  Surgeon: SKatha Cabal MD;  Location: [facility] ORS;  Service: Vascular;  Laterality: Right;  . AMPUTATION Left 03/11/2018   Procedure: AMPUTATION BELOW KNEE;  Surgeon: DAlgernon Huxley MD;  Location: [facility] ORS;  Service: Vascular;  Laterality: Left;  . AMPUTATION Bilateral 04/13/2018   Procedure: REVISION OF BILATERAL AMPUTATIONS BELOW KNEE WITH WOUND VAC APPLICATIONS;  Surgeon: EEvaristo Bury MD;  Location: [facility] ORS;  Service: Vascular;  Laterality: Bilateral;  . AMPUTATION TOE Left 02/06/2018   Procedure: AMPUTATION GREAT TOE;  Surgeon: FSamara Deist DPM;  Location: [facility] ORS;  Service: Podiatry;  Laterality: Left;  . APPLICATION OF WOUND VAC Left 04/13/2018   Procedure: APPLICATION OF WOUND VAC;  Surgeon: EEvaristo Bury MD;  Location: [facility] ORS;  Service: Vascular;  Laterality: Left;  . ARTERIOVENOUS GRAFT PLACEMENT  05/2016  . AV FISTULA PLACEMENT Left 05/30/2016   Procedure: ARTERIOVENOUS graft;  Surgeon: JAlgernon Huxley MD;  Location: [facility] OR[facility];  Service: Vascular;  Laterality: Left;  . CAPD INSERTION N/A 10/30/2017   Procedure:  LAPAROSCOPIC INSERTION CONTINUOUS AMBULATORY PERITONEAL DIALYSIS  (CAPD) CATHETER;  Surgeon: Algernon Huxley, MD;  Location: [facility] OR[facility];  Service: Vascular;  Laterality: N/A;  . CARDIAC CATHETERIZATION  01/2015   [facility]: Ost LM 70%, p-m LAD 50-60% (Rest FFR + @ 0.75), mRCA 80-90%, ostD1 40%, pOM1 40%  . CATARACT EXTRACTION W/PHACO Left 01/18/2016   Procedure: CATARACT EXTRACTION PHACO AND INTRAOCULAR LENS PLACEMENT (IOC);  Surgeon: Eulogio Bear, MD;  Location: [facility] OR[facility];  Service: Ophthalmology;   Laterality: Left;  [place] 1.05 AP% 15.5 CDE 10.16 Fluid Pack Lot # [id number]  . CATARACT EXTRACTION W/PHACO Right 08/01/2016   Procedure: CATARACT EXTRACTION PHACO AND INTRAOCULAR LENS PLACEMENT (IOC);  Surgeon: Eulogio Bear, MD;  Location: [facility];  Service: Ophthalmology;  Laterality: Right;  [place] 01:00.6 AP% 11.4 CDE 6.93  LOT # [id number]  . COLONOSCOPY    . CORONARY ANGIOPLASTY     SENTS 02/12/16  . CORONARY ARTERY BYPASS GRAFT  03/28/15    [facility]: Dr. Waldemar Dickens: LIMA to LAD, SVG to RCA, SVG to OM3, EVH  . DIALYSIS/PERMA CATHETER INSERTION N/A 02/11/2018   Procedure: DIALYSIS/PERMA CATHETER INSERTION;  Surgeon: Algernon Huxley, MD;  Location: [place] [facility];  Service: Cardiovascular;  Laterality: N/A;  . DIALYSIS/PERMA CATHETER REMOVAL Right 02/04/2018   Procedure: DIALYSIS/PERMA CATHETER REMOVAL;  Surgeon: Katha Cabal, MD;  Location: [place] [facility];  Service: Cardiovascular;  Laterality: Right;  . ESOPHAGOGASTRODUODENOSCOPY (EGD) WITH PROPOFOL N/A 10/24/2016   Procedure: ESOPHAGOGASTRODUODENOSCOPY (EGD) WITH PROPOFOL;  Surgeon: [name], MD;  Location: [facility];  Service: Endoscopy;  Laterality: N/A;  . EYE SURGERY Bilateral    cataract surgery  . EYE SURGERY     drains for glaucoma  . INSERTION EXPRESS TUBE SHUNT Right 08/01/2016   Procedure: INSERTION EXPRESS TUBE SHUNT;  Surgeon: Eulogio Bear, MD;  Location: [facility];  Service: Ophthalmology;  Laterality: Right;  . INSERTION OF AHMED VALVE Left 08/15/2016   Procedure: INSERTION OF AHMED VALVE;  Surgeon: Eulogio Bear, MD;  Location: [facility];  Service: Ophthalmology;  Laterality: Left;  . INSERTION OF DIALYSIS CATHETER    . LOWER EXTREMITY ANGIOGRAPHY Right 02/19/2018   Procedure: Lower Extremity Angiography;  Surgeon: Algernon Huxley, MD;  Location: [place] [facility];  Service: Cardiovascular;  Laterality: Right;  . LOWER EXTREMITY ANGIOGRAPHY Left 01/30/2018   Procedure: Lower Extremity Angiography;   Surgeon: Katha Cabal, MD;  Location: [place] CV LAB;  Service: Cardiovascular;  Laterality: Left;  . REMOVAL OF A DIALYSIS CATHETER N/A 02/25/2018   Procedure: REMOVAL OF A DIALYSIS CATHETER;  Surgeon: Katha Cabal, MD;  Location: [facility];  Service: Vascular;  Laterality: N/A;  . TEMPORARY DIALYSIS CATHETER N/A 02/06/2018   Procedure: TEMPORARY DIALYSIS CATHETER;  Surgeon: Katha Cabal, MD;  Location: [place] [facility];  Service: Cardiovascular;  Laterality: N/A;  . TRANSTHORACIC ECHOCARDIOGRAM  January 2017    EF 60-65%. GR 2 DD. Mild degenerative mitral valve disease but no prolapse or regurgitation. Mild left atrial dilation. Mild to moderate LVH. Pericardial effusion gone  . UPPER EXTREMITY ANGIOGRAPHY Left 06/27/2016   Procedure: Upper Extremity Angiography;  Surgeon: Algernon Huxley, MD;  Location: [place] [facility] LAB;  Service: Cardiovascular;  Laterality: Left;  . UPPER EXTREMITY ANGIOGRAPHY Left 08/22/2016   Procedure: Upper Extremity Angiography;  Surgeon: Algernon Huxley, MD;  Location: [place] [facility];  Service: Cardiovascular;  Laterality: Left;  Marland Kitchen VASCULAR SURGERY    . WOUND DEBRIDEMENT  Left 05/08/2018   Procedure: DEBRIDEMENT OF LEFT LEG BKA;  Surgeon: Algernon Huxley, MD;  Location: [facility];  Service: General;  Laterality: Left;     Home Meds: Prior to Admission medications   Medication Sig Start Date End Date Taking? Authorizing Provider  amoxicillin-clavulanate (AUGMENTIN) 875-125 MG tablet Take 1 tablet by mouth 2 (two) times daily. 05/09/20  Yes [provider]  apixaban (ELIQUIS) 2.5 MG TABS tablet Take 1 tablet (2.5 mg total) by mouth 2 (two) times daily. 02/15/18  Yes Vaughan Basta, MD  aspirin EC 81 MG tablet Take 1 tablet (81 mg total) by mouth daily. 10/19/16  Yes Dustin Flock, MD  atorvastatin (LIPITOR) 80 MG tablet Take 80 mg by mouth at bedtime.  05/05/17  Yes [provider]  brimonidine (ALPHAGAN) 0.2 % ophthalmic  solution Place 1 drop into both eyes 3 (three) times daily. 11/05/19  Yes Nicole Kindred A, DO  carvedilol (COREG) 12.5 MG tablet Take 12.5 mg by mouth 2 (two) times daily. Take in combination with 25 mg for total of 37.5 mg twice daily 02/24/20  Yes [provider]  carvedilol (COREG) 25 MG tablet Take 1 tablet (25 mg total) by mouth 2 (two) times daily. 07/13/18  Yes Ojie, Jude, MD  fluticasone (FLONASE) 50 MCG/ACT nasal spray Place 2 sprays into both nostrils daily.   Yes [provider]  hydrALAZINE (APRESOLINE) 25 MG tablet Take 3 tablets (75 mg total) by mouth every 8 (eight) hours. 04/19/20  Yes Shelly Coss, MD  insulin aspart (NOVOLOG) 100 UNIT/ML injection Inject 0-6 Units into the skin See admin instructions. Inject under the skin according to blood glucose reading:  <201: 0u 201- 250: 2u 251-300: 4u 301-350: 5u 351-400: 6u   Yes [provider]  insulin lispro (HUMALOG) 100 UNIT/ML KwikPen Inject 2-6 Units into the skin 3 (three) times daily before meals.   Yes [provider]  ipratropium (ATROVENT) 0.06 % nasal spray Place 2 sprays into both nostrils 2 (two) times daily. 09/05/19  Yes Wieting, Richard, MD  isosorbide mononitrate (IMDUR) 60 MG 24 hr tablet Take 1 tablet (60 mg total) by mouth daily. 04/19/20  Yes Shelly Coss, MD  levETIRAcetam (KEPPRA) 250 MG tablet Three times weekly after dialysis Patient taking differently: Take 250 mg by mouth every Monday, Wednesday, and Friday. 05/12/18  Yes Wieting, Richard, MD  levETIRAcetam (KEPPRA) 500 MG tablet Take 500 mg by mouth 2 (two) times daily. 02/24/20  Yes [provider]  losartan (COZAAR) 50 MG tablet Take 1 tablet (50 mg total) by mouth daily. 04/19/20  Yes Shelly Coss, MD  meclizine (ANTIVERT) 25 MG tablet Take 25 mg by mouth every 4 (four) hours as needed for dizziness.   Yes [provider]  mirtazapine (REMERON) 15 MG tablet Take 7.5 mg by mouth daily.    Yes  [provider]  Omega-3 1000 MG CAPS Take 2 capsules by mouth daily.    Yes [provider]  pantoprazole (PROTONIX) 40 MG tablet Take 1 tablet (40 mg total) by mouth daily. 06/04/19  Yes [name], MD  Polyvinyl Alcohol-Povidone PF (REFRESH) 1.4-0.6 % SOLN Place 1 drop into both eyes 2 (two) times a day.    Yes [provider]  rOPINIRole (REQUIP) 0.25 MG tablet Take 0.25 mg by mouth at bedtime.   Yes [provider]  senna-docusate (SENOKOT-S) 8.6-50 MG tablet Take 1 tablet by mouth 2 (two) times daily. 04/19/20  Yes Shelly Coss, MD  sevelamer  carbonate (RENVELA) 800 MG tablet 2 Tablet(s) By Mouth 3 Times Daily   Yes [provider]  thiamine 100 MG tablet Take 1 tablet (100 mg total) by mouth daily. 04/19/20  Yes Shelly Coss, MD  timolol (TIMOPTIC) 0.5 % ophthalmic solution Place 1 drop into both eyes 3 (three) times daily. 03/18/18  Yes Mody, Ulice Bold, MD  triamcinolone cream (KENALOG) 0.1 % Apply 1 application topically daily as needed (dry skin). 11/01/19  Yes [provider]  vitamin B-12 (CYANOCOBALAMIN) 1000 MCG tablet Take 1 tablet (1,000 mcg total) by mouth daily. 02/15/18  Yes Vaughan Basta, MD    Inpatient Medications:  . apixaban  2.5 mg Oral BID  . aspirin EC  81 mg Oral Daily  . atorvastatin  80 mg Oral QHS  . brimonidine  1 drop Both Eyes TID  . carvedilol  25 mg Oral BID WC  . Chlorhexidine Gluconate Cloth  6 each Topical Q0600  . epoetin (EPOGEN/PROCRIT) injection  10,000 Units Intravenous Q M,W,F-HD  . fluticasone  2 spray Each Nare Daily  . insulin aspart  0-6 Units Subcutaneous TID WC  . isosorbide mononitrate  60 mg Oral Daily  . levETIRAcetam  250 mg Oral Q M,W,F  . levETIRAcetam  500 mg Oral BID  . mirtazapine  7.5 mg Oral Daily  . pantoprazole  40 mg Oral Daily  . sevelamer carbonate  1,600 mg Oral TID WC     Allergies:  Allergies  Allergen Reactions  . Chlorthalidone Anaphylaxis,  Itching and Rash  . Dilaudid [Hydromorphone Hcl] Anaphylaxis    Hypersensitivity. 0.66m caused pt to become unresponsive  . Fentanyl Rash  . Midazolam Rash  . Morphine And Related Anaphylaxis    Pt unresponsive, apneic after 0.562mdilaudid   . Ace Inhibitors Other (See Comments)    Reaction:  Hyperkalemia, agitation   . Angiotensin Receptor Blockers Other (See Comments)    Reaction:  Hyperkalemia, agitation   . Metoclopramide Other (See Comments)  . Norvasc [Amlodipine] Itching and Rash  . Phenergan [Promethazine Hcl] Anxiety    "antsy, can't sit still"    Social History   Socioeconomic History  . Marital status: Married    Spouse name: Not on file  . Number of children: Not on file  . Years of education: Not on file  . Highest education level: Not on file  Occupational History  . Not on file  Tobacco Use  . Smoking status: Never Smoker  . Smokeless tobacco: Never Used  Vaping Use  . Vaping Use: Never used  Substance and Sexual Activity  . Alcohol use: No  . Drug use: No  . Sexual activity: Never  Other Topics Concern  . Not on file  Social History Narrative   [place]   Social Determinants of Health   Financial Resource Strain: Not on file  Food Insecurity: Not on file  Transportation Needs: Not on file  Physical Activity: Not on file  Stress: Not on file  Social Connections: Not on file  Intimate Partner Violence: Not on file     Family History  Problem Relation Age of Onset  . Diabetes Mellitus II Mother   . Heart failure Mother   . Pancreatic cancer Father      Review of Systems Positive for shortness of breath Negative for: General:  chills, fever, night sweats or weight changes.  Cardiovascular: PND orthopnea syncope dizziness  Dermatological skin lesions rashes Respiratory: Cough congestion Urologic: Frequent urination urination at  night and hematuria Abdominal: negative for nausea, vomiting, diarrhea, bright red blood  per rectum, melena, or hematemesis Neurologic: negative for visual changes, and/or hearing changes  All other systems reviewed and are otherwise negative except as noted above.  Labs: Recent Labs    05/14/20 0110  CKTOTAL 153   Lab Results  Component Value Date   WBC 3.5 (L) 05/15/2020   HGB 8.2 (L) 05/15/2020   HCT 27.3 (L) 05/15/2020   MCV 98.9 05/15/2020   PLT 73 (L) 05/15/2020    Recent Labs  Lab 05/15/20 0321 05/16/20 0605  NA 140 139  K 4.2 3.6  CL 94* 98  CO2 30 27  BUN 45* 24*  CREATININE 7.67* 5.24*  CALCIUM 7.8* 7.8*  PROT 6.5  --   BILITOT 0.7  --   ALKPHOS 69  --   ALT 14  --   AST 16  --   GLUCOSE 166* 135*   Lab Results  Component Value Date   CHOL 153 07/08/2018   HDL 35 (L) 07/08/2018   LDLCALC 55 07/08/2018   TRIG 313 (H) 07/08/2018   No results found for: DDIMER  Radiology/Studies:  DG Chest 2 View  Result Date: 04/17/2020 CLINICAL DATA:  Pneumonia EXAM: CHEST - 2 VIEW COMPARISON:  04/10/2020 FINDINGS: CABG changes with cardiac enlargement. Mild pulmonary vascular congestion has progressed. Small left pleural effusion has progressed. Small right effusion unchanged. Left lower lobe atelectasis/infiltrate with mild progression. Improved aeration right lung base. Left jugular central venous catheter tip in the right atrium unchanged. No pneumothorax IMPRESSION: Cardiac enlargement. Progressive mild vascular congestion and small left effusion suggesting fluid overload. Left lower lobe atelectasis/infiltrate with progression. Improved aeration right lung base. Electronically Signed   By: Franchot Gallo M.D.   On: 04/17/2020 08:26   DG Chest Portable 1 View  Result Date: 05/13/2020 CLINICAL DATA:  70 year old female status post cardiac arrest. EXAM: PORTABLE CHEST 1 VIEW COMPARISON:  Chest radiograph dated 05/10/2020. FINDINGS: Dialysis catheter in similar position. There is cardiomegaly with vascular congestion and small bilateral pleural effusions.  No pneumothorax. Median sternotomy wires and CABG vascular clips. Atherosclerotic calcification of the aorta. No acute osseous pathology. IMPRESSION: Cardiomegaly with vascular congestion and small bilateral pleural effusions. Electronically Signed   By: Anner Crete M.D.   On: 05/13/2020 19:12   DG Chest Port 1 View  Result Date: 05/10/2020 CLINICAL DATA:  Shortness of breath and cough. EXAM: PORTABLE CHEST 1 VIEW COMPARISON:  04/17/2020 FINDINGS: Stable positioning left jugular tunneled dialysis catheter. Lungs demonstrate increased airspace disease, right greater than left with component of small bilateral pleural effusions. Although this may represent primarily CHF/pulmonary edema, pneumonia cannot be excluded, especially in the right mid to lower lung. No pneumothorax. Stable heart size. IMPRESSION: Increased airspace disease, right greater than left, with component of small bilateral pleural effusions. Although this may represent primarily CHF/pulmonary edema, pneumonia cannot be excluded, especially in the right mid to lower lung. Electronically Signed   By: Aletta Edouard M.D.   On: 05/10/2020 09:42   ECHOCARDIOGRAM COMPLETE  Result Date: 05/15/2020    ECHOCARDIOGRAM REPORT   Patient Name:   Stephanie Ellis Date of Exam: 05/15/2020 Medical Rec #:  [id number]     Height:       52.0 in Accession #:    [id number]    Weight:       132.3 lb Date of Birth:  November 27, 1950    BSA:  1.412 m Patient Age:    64 years      BP:           119/68 mmHg Patient Gender: F             HR:           62 bpm. Exam Location:  [facility] Procedure: 2D Echo, Color Doppler, Cardiac Doppler and Strain Analysis Indications:     I46.9 Cardiac arrest  History:         Patient has prior history of Echocardiogram examinations.                  HFpEF, Prior CABG, PVD, ESRD; Risk Factors:Diabetes and HCL.  Sonographer:     Charmayne Sheer RDCS (AE) Referring Phys:  [id number] Victoriano Lain A THOMAS Diagnosing Phys: Neoma Laming MD  Sonographer  Comments: Global longitudinal strain was attempted. IMPRESSIONS  1. Left ventricular ejection fraction, by estimation, is 30 to 35%. The left ventricle has severely decreased function. The left ventricle demonstrates global hypokinesis. The left ventricular internal cavity size was moderately dilated. There is severe  concentric left ventricular hypertrophy. Left ventricular diastolic parameters are consistent with Grade III diastolic dysfunction (restrictive).  2. Right ventricular systolic function is severely reduced. The right ventricular size is severely enlarged. Severely increased right ventricular wall thickness.  3. Left atrial size was severely dilated.  4. Right atrial size was severely dilated.  5. The pericardial effusion is circumferential.  6. The mitral valve is degenerative. Mild mitral valve regurgitation. Severe mitral annular calcification.  7. The tricuspid valve is degenerative.  8. The aortic valve is calcified. Aortic valve regurgitation is mild to moderate. Mild to moderate aortic valve sclerosis/calcification is present, without any evidence of aortic stenosis. FINDINGS  Left Ventricle: Left ventricular ejection fraction, by estimation, is 30 to 35%. The left ventricle has severely decreased function. The left ventricle demonstrates global hypokinesis. The left ventricular internal cavity size was moderately dilated. There is severe concentric left ventricular hypertrophy. Left ventricular diastolic parameters are consistent with Grade III diastolic dysfunction (restrictive). Right Ventricle: The right ventricular size is severely enlarged. Severely increased right ventricular wall thickness. Right ventricular systolic function is severely reduced. Left Atrium: Left atrial size was severely dilated. Right Atrium: Right atrial size was severely dilated. Pericardium: Trivial pericardial effusion is present. The pericardial effusion is circumferential. Mitral Valve: The mitral valve is  degenerative in appearance. Severe mitral annular calcification. Mild mitral valve regurgitation. MV peak gradient, 8.2 mmHg. The mean mitral valve gradient is 3.0 mmHg. Tricuspid Valve: The tricuspid valve is degenerative in appearance. Tricuspid valve regurgitation is mild. Aortic Valve: The aortic valve is calcified. Aortic valve regurgitation is mild to moderate. Mild to moderate aortic valve sclerosis/calcification is present, without any evidence of aortic stenosis. Aortic valve mean gradient measures 3.0 mmHg. Aortic valve peak gradient measures 6.7 mmHg. Aortic valve area, by VTI measures 1.72 cm. Pulmonic Valve: The pulmonic valve was not well visualized. Pulmonic valve regurgitation is not visualized. Aorta: The aortic root was not well visualized. IAS/Shunts: No atrial level shunt detected by color flow Doppler.  LEFT VENTRICLE PLAX 2D LVIDd:         4.09 cm     Diastology LVIDs:         3.42 cm     LV e' medial:    3.37 cm/s LV PW:         1.37 cm     LV E/e' medial:  45.1 LV IVS:  1.30 cm     LV e' lateral:   5.44 cm/s LVOT diam:     1.90 cm     LV E/e' lateral: 27.9 LV SV:         45 LV SV Index:   32 LVOT Area:     2.84 cm  LV Volumes (MOD) LV vol d, MOD A2C: 56.7 ml LV vol d, MOD A4C: 80.3 ml LV vol s, MOD A2C: 29.3 ml LV vol s, MOD A4C: 36.7 ml LV SV MOD A2C:     27.4 ml LV SV MOD A4C:     80.3 ml LV SV MOD BP:      35.9 ml RIGHT VENTRICLE RV Basal diam:  3.87 cm LEFT ATRIUM             Index       RIGHT ATRIUM           Index LA diam:        4.00 cm 2.83 cm/m  RA Area:     12.20 cm LA Vol (A2C):   54.3 ml 38.46 ml/m RA Volume:   27.20 ml  19.27 ml/m LA Vol (A4C):   28.8 ml 20.40 ml/m LA Biplane Vol: 43.2 ml 30.60 ml/m  AORTIC VALVE AV Area (Vmax):    1.66 cm AV Area (Vmean):   1.72 cm AV Area (VTI):     1.72 cm AV Vmax:           129.00 cm/s AV Vmean:          82.600 cm/s AV VTI:            0.262 m AV Peak Grad:      6.7 mmHg AV Mean Grad:      3.0 mmHg LVOT Vmax:         75.50  cm/s LVOT Vmean:        50.100 cm/s LVOT VTI:          0.159 m LVOT/AV VTI ratio: 0.61  AORTA Ao Root diam: 2.70 cm MITRAL VALVE                TRICUSPID VALVE MV Area (PHT): 3.26 cm     TR Peak grad:   37.2 mmHg MV Area VTI:   1.07 cm     TR Vmax:        305.00 cm/s MV Peak grad:  8.2 mmHg MV Mean grad:  3.0 mmHg     SHUNTS MV Vmax:       1.43 m/s     Systemic VTI:  0.16 m MV Vmean:      74.7 cm/s    Systemic Diam: 1.90 cm MV Decel Time: 233 msec MV E velocity: 152.00 cm/s MV A velocity: 130.00 cm/s MV E/A ratio:  1.17 Neoma Laming MD Electronically signed by Neoma Laming MD Signature Date/Time: 05/15/2020/12:30:54 PM    Final     EKG: Normal sinus rhythm left atrial enlargement left anterior anterior fascicular block  Weights: Filed Weights   05/13/20 1914  Weight: 60 kg     Physical Exam: Blood pressure (!) 168/76, pulse 60, temperature 98.1 F (36.7 C), temperature source Oral, resp. rate 16, weight 60 kg, SpO2 100 %. Body mass index is 34.39 kg/m. General: Well developed, well nourished, in no acute distress. Head eyes ears nose throat: Normocephalic, atraumatic, sclera non-icteric, no xanthomas, nares are without discharge. No apparent thyromegaly and/or mass  Lungs: Normal respiratory effort.  Few wheezes, no rales,  some rhonchi.  Heart: RRR with normal S1 S2. no murmur gallop, no rub, PMI is normal size and placement, carotid upstroke normal without bruit, jugular venous pressure is normal Abdomen: Soft, non-tender, non-distended with normoactive bowel sounds. No hepatomegaly. No rebound/guarding. No obvious abdominal masses. Abdominal aorta is normal size without bruit Extremities: Bilateral below the knee amputation Peripheral : 2+ bilateral upper extremity pulses, 1+ bilateral femoral pulses,  Neuro: Alert and oriented. No facial asymmetry. No focal deficit. Moves all extremities spontaneously. Musculoskeletal: Normal muscle tone without kyphosis Psych:  Responds to questions  appropriately with a normal affect.    Assessment: 70 year old female with known coronary disease status post coronary bypass graft hypertension hyperlipidemia diabetes end-stage renal disease paroxysmal nonvalvular atrial fibrillation for which she had apparent V. fib arrest when traveling to the hospital after noncompliance with dialysis schedule likely secondary to electrolyte abnormalities without evidence of acute coronary syndrome or primary myocardial infarction 1.  Continuation of supportive care for recent issues with admission 2.  No further cardiac diagnostics necessary at this time 3.  Continuation of beta-blocker isosorbide for treatment of hypertension and cardiovascular risk reduction 4.  Anticoagulation for further risk reduction in stroke with atrial fibrillation as before 5.  Okay with discharged home if no other cardiovascular symptoms occur 6.  Please call with further questions or need for Cardiologic treatment   Signed, Corey Skains M.D. [facility] 05/16/2020, 1:34 PM

## 2020-05-16 NOTE — Discharge Summary (Signed)
Physician Discharge Summary  Stephanie Ellis ZDG:[id number] DOB: February 16, 1951 DOA: 05/13/2020  PCP: Merryl Hacker, No  Admit date: 05/13/2020 Discharge date: 05/16/2020  Admitted From: SNF Disposition: SNF  Recommendations for Outpatient Follow-up:  1. Follow up with PCP in 1-2 weeks 2. Please obtain BMP/CBC in one week 3. Please follow up on the following pending results: None  Home Health: No Equipment/Devices: Wheelchair Discharge Condition: Stable CODE STATUS: Full Diet recommendation: Heart Healthy / Carb Modified   Brief/Interim Summary: 70 year old female past medical history of diabetes, hypertension, PAD status post bilateral BKA, CAD status post CABG, A. fib on apixaban and PEA arrest felt to be secondary to Dilaudid and recent admission discharged on 12/29 came to the emergency room on 1/22 after syncopal episodes at her nursing home.  Since last hospitalization, patient has missed multiple dialysis visits due to not feeling well.  While being transported by EMS to the emergency room, patient had a V. fib arrest and was given CPR for 4 minutes and resuscitated.  In the emergency room, found to be hyperkalemic which was felt to be the cause of her cardiac issues which was found to be secondary to missing dialysis sessions.  Admitted to the hospitalist service.  Nephrology consulted and patient underwent emergent dialysis.  Patient also seen by cardiology who recommended avoiding amiodarone and continue Eliquis for A. fib.  Troponin peaked at 145.  Echocardiogram done with a EF of 30 to 35% and grade 3 diastolic dysfunction.  Patient had 2 dialysis sessions and potassium was within normal limit on discharge.  She needs to get her dialysis according to her schedule without missing any session to prevent further complications.  Message was conveyed to nursing facility by [facility].  Patient has an history of seizure disorder, no acute concern during current hospitalization and she will continue her home  dose of Keppra daily and after dialysis session.  She will continue rest of her home medications and follow-up with her providers.  Discharge Diagnoses:  Principal Problem:   Cardiac arrest [facility]) Active Problems:   ESRD (end stage renal disease) on dialysis ([place])   Hypertension   Accelerated hypertension   Type 2 diabetes mellitus with hyperlipidemia (HCC)   Peripheral vascular disease (HCC)   Acute respiratory failure with hypoxia (HCC)   Acute on chronic combined systolic (congestive) and diastolic (congestive) heart failure ([place])   Dialysis patient, noncompliant (HCC)   Hyperkalemia   Discharge Instructions  Discharge Instructions    Diet - low sodium heart healthy   Complete by: As directed    Discharge instructions   Complete by: As directed    Please make sure that patient does not miss any dialysis and take her medications regularly.   Increase activity slowly   Complete by: As directed    No wound care   Complete by: As directed      Allergies as of 05/16/2020      Reactions   Chlorthalidone Anaphylaxis, Itching, Rash   Dilaudid [hydromorphone Hcl] Anaphylaxis   Hypersensitivity. 0.34m caused pt to become unresponsive   Fentanyl Rash   Midazolam Rash   Morphine And Related Anaphylaxis   Pt unresponsive, apneic after 0.542mdilaudid    Ace Inhibitors Other (See Comments)   Reaction:  Hyperkalemia, agitation    Angiotensin Receptor Blockers Other (See Comments)   Reaction:  Hyperkalemia, agitation    Metoclopramide Other (See Comments)   Norvasc [amlodipine] Itching, Rash   Phenergan [promethazine Hcl] Anxiety   "antsy, can't sit still"  Medication List    STOP taking these medications   amoxicillin-clavulanate 875-125 MG tablet Commonly known as: AUGMENTIN     TAKE these medications   apixaban 2.5 MG Tabs tablet Commonly known as: ELIQUIS Take 1 tablet (2.5 mg total) by mouth 2 (two) times daily.   aspirin EC 81 MG tablet Take 1 tablet (81 mg  total) by mouth daily.   atorvastatin 80 MG tablet Commonly known as: LIPITOR Take 80 mg by mouth at bedtime.   brimonidine 0.2 % ophthalmic solution Commonly known as: ALPHAGAN Place 1 drop into both eyes 3 (three) times daily.   carvedilol 25 MG tablet Commonly known as: COREG Take 1 tablet (25 mg total) by mouth 2 (two) times daily. What changed: Another medication with the same name was removed. Continue taking this medication, and follow the directions you see here.   fluticasone 50 MCG/ACT nasal spray Commonly known as: FLONASE Place 2 sprays into both nostrils daily.   hydrALAZINE 25 MG tablet Commonly known as: APRESOLINE Take 3 tablets (75 mg total) by mouth every 8 (eight) hours.   insulin aspart 100 UNIT/ML injection Commonly known as: novoLOG Inject 0-6 Units into the skin See admin instructions. Inject under the skin according to blood glucose reading:  <201: 0u 201- 250: 2u 251-300: 4u 301-350: 5u 351-400: 6u   insulin lispro 100 UNIT/ML KwikPen Commonly known as: HUMALOG Inject 2-6 Units into the skin 3 (three) times daily before meals.   ipratropium 0.06 % nasal spray Commonly known as: ATROVENT Place 2 sprays into both nostrils 2 (two) times daily.   isosorbide mononitrate 60 MG 24 hr tablet Commonly known as: IMDUR Take 1 tablet (60 mg total) by mouth daily.   levETIRAcetam 250 MG tablet Commonly known as: KEPPRA Three times weekly after dialysis What changed:   how much to take  how to take this  when to take this  additional instructions   levETIRAcetam 500 MG tablet Commonly known as: KEPPRA Take 500 mg by mouth 2 (two) times daily. What changed: Another medication with the same name was changed. Make sure you understand how and when to take each.   losartan 50 MG tablet Commonly known as: COZAAR Take 1 tablet (50 mg total) by mouth daily.   meclizine 25 MG tablet Commonly known as: ANTIVERT Take 25 mg by mouth every 4 (four)  hours as needed for dizziness.   mirtazapine 15 MG tablet Commonly known as: REMERON Take 7.5 mg by mouth daily.   Omega-3 1000 MG Caps Take 2 capsules by mouth daily.   pantoprazole 40 MG tablet Commonly known as: PROTONIX Take 1 tablet (40 mg total) by mouth daily.   Refresh 1.4-0.6 % Soln Generic drug: Polyvinyl Alcohol-Povidone PF Place 1 drop into both eyes 2 (two) times a day.   rOPINIRole 0.25 MG tablet Commonly known as: REQUIP Take 0.25 mg by mouth at bedtime.   senna-docusate 8.6-50 MG tablet Commonly known as: Senokot-S Take 1 tablet by mouth 2 (two) times daily.   sevelamer carbonate 800 MG tablet Commonly known as: RENVELA 2 Tablet(s) By Mouth 3 Times Daily   thiamine 100 MG tablet Take 1 tablet (100 mg total) by mouth daily.   timolol 0.5 % ophthalmic solution Commonly known as: TIMOPTIC Place 1 drop into both eyes 3 (three) times daily.   triamcinolone 0.1 % Commonly known as: KENALOG Apply 1 application topically daily as needed (dry skin).   vitamin B-12 1000 MCG tablet Commonly known as: CYANOCOBALAMIN  Take 1 tablet (1,000 mcg total) by mouth daily.       Allergies  Allergen Reactions  . Chlorthalidone Anaphylaxis, Itching and Rash  . Dilaudid [Hydromorphone Hcl] Anaphylaxis    Hypersensitivity. 0.71m caused pt to become unresponsive  . Fentanyl Rash  . Midazolam Rash  . Morphine And Related Anaphylaxis    Pt unresponsive, apneic after 0.510mdilaudid   . Ace Inhibitors Other (See Comments)    Reaction:  Hyperkalemia, agitation   . Angiotensin Receptor Blockers Other (See Comments)    Reaction:  Hyperkalemia, agitation   . Metoclopramide Other (See Comments)  . Norvasc [Amlodipine] Itching and Rash  . Phenergan [Promethazine Hcl] Anxiety    "antsy, can't sit still"    Consultations:  Nephrology  Cardiology  Procedures/Studies: DG Chest 2 View  Result Date: 04/17/2020 CLINICAL DATA:  Pneumonia EXAM: CHEST - 2 VIEW COMPARISON:   04/10/2020 FINDINGS: CABG changes with cardiac enlargement. Mild pulmonary vascular congestion has progressed. Small left pleural effusion has progressed. Small right effusion unchanged. Left lower lobe atelectasis/infiltrate with mild progression. Improved aeration right lung base. Left jugular central venous catheter tip in the right atrium unchanged. No pneumothorax IMPRESSION: Cardiac enlargement. Progressive mild vascular congestion and small left effusion suggesting fluid overload. Left lower lobe atelectasis/infiltrate with progression. Improved aeration right lung base. Electronically Signed   By: ChFranchot Gallo.D.   On: 04/17/2020 08:26   DG Chest Portable 1 View  Result Date: 05/13/2020 CLINICAL DATA:  [age]ear old female status post cardiac arrest. EXAM: PORTABLE CHEST 1 VIEW COMPARISON:  Chest radiograph dated 05/10/2020. FINDINGS: Dialysis catheter in similar position. There is cardiomegaly with vascular congestion and small bilateral pleural effusions. No pneumothorax. Median sternotomy wires and CABG vascular clips. Atherosclerotic calcification of the aorta. No acute osseous pathology. IMPRESSION: Cardiomegaly with vascular congestion and small bilateral pleural effusions. Electronically Signed   By: ArAnner Crete.D.   On: 05/13/2020 19:12   DG Chest Port 1 View  Result Date: 05/10/2020 CLINICAL DATA:  Shortness of breath and cough. EXAM: PORTABLE CHEST 1 VIEW COMPARISON:  04/17/2020 FINDINGS: Stable positioning left jugular tunneled dialysis catheter. Lungs demonstrate increased airspace disease, right greater than left with component of small bilateral pleural effusions. Although this may represent primarily CHF/pulmonary edema, pneumonia cannot be excluded, especially in the right mid to lower lung. No pneumothorax. Stable heart size. IMPRESSION: Increased airspace disease, right greater than left, with component of small bilateral pleural effusions. Although this may represent  primarily CHF/pulmonary edema, pneumonia cannot be excluded, especially in the right mid to lower lung. Electronically Signed   By: GlAletta Edouard.D.   On: 05/10/2020 09:42   ECHOCARDIOGRAM COMPLETE  Result Date: 05/15/2020    ECHOCARDIOGRAM REPORT   Patient Name:   Stephanie HOLECEKate of Exam: 05/15/2020 Medical Rec #:  [id number]   Height:       52.0 in Accession #:    [id number]  Weight:       132.3 lb Date of Birth:  10Mar 07, 1952  BSA:          1.412 m Patient Age:    [age]      BP:           119/68 mmHg Patient Gender: F             HR:           62 bpm. Exam Location:  [facility] Procedure: 2D Echo, Color Doppler, Cardiac Doppler and  Strain Analysis Indications:     I46.9 Cardiac arrest  History:         Patient has prior history of Echocardiogram examinations.                  HFpEF, Prior CABG, PVD, ESRD; Risk Factors:Diabetes and HCL.  Sonographer:     Charmayne Sheer RDCS (AE) Referring Phys:  [id number] Victoriano Lain A THOMAS Diagnosing Phys: Neoma Laming MD  Sonographer Comments: Global longitudinal strain was attempted. IMPRESSIONS  1. Left ventricular ejection fraction, by estimation, is 30 to 35%. The left ventricle has severely decreased function. The left ventricle demonstrates global hypokinesis. The left ventricular internal cavity size was moderately dilated. There is severe  concentric left ventricular hypertrophy. Left ventricular diastolic parameters are consistent with Grade III diastolic dysfunction (restrictive).  2. Right ventricular systolic function is severely reduced. The right ventricular size is severely enlarged. Severely increased right ventricular wall thickness.  3. Left atrial size was severely dilated.  4. Right atrial size was severely dilated.  5. The pericardial effusion is circumferential.  6. The mitral valve is degenerative. Mild mitral valve regurgitation. Severe mitral annular calcification.  7. The tricuspid valve is degenerative.  8. The aortic valve is calcified. Aortic  valve regurgitation is mild to moderate. Mild to moderate aortic valve sclerosis/calcification is present, without any evidence of aortic stenosis. FINDINGS  Left Ventricle: Left ventricular ejection fraction, by estimation, is 30 to 35%. The left ventricle has severely decreased function. The left ventricle demonstrates global hypokinesis. The left ventricular internal cavity size was moderately dilated. There is severe concentric left ventricular hypertrophy. Left ventricular diastolic parameters are consistent with Grade III diastolic dysfunction (restrictive). Right Ventricle: The right ventricular size is severely enlarged. Severely increased right ventricular wall thickness. Right ventricular systolic function is severely reduced. Left Atrium: Left atrial size was severely dilated. Right Atrium: Right atrial size was severely dilated. Pericardium: Trivial pericardial effusion is present. The pericardial effusion is circumferential. Mitral Valve: The mitral valve is degenerative in appearance. Severe mitral annular calcification. Mild mitral valve regurgitation. MV peak gradient, 8.2 mmHg. The mean mitral valve gradient is 3.0 mmHg. Tricuspid Valve: The tricuspid valve is degenerative in appearance. Tricuspid valve regurgitation is mild. Aortic Valve: The aortic valve is calcified. Aortic valve regurgitation is mild to moderate. Mild to moderate aortic valve sclerosis/calcification is present, without any evidence of aortic stenosis. Aortic valve mean gradient measures 3.0 mmHg. Aortic valve peak gradient measures 6.7 mmHg. Aortic valve area, by VTI measures 1.72 cm. Pulmonic Valve: The pulmonic valve was not well visualized. Pulmonic valve regurgitation is not visualized. Aorta: The aortic root was not well visualized. IAS/Shunts: No atrial level shunt detected by color flow Doppler.  LEFT VENTRICLE PLAX 2D LVIDd:         4.09 cm     Diastology LVIDs:         3.42 cm     LV e' medial:    3.37 cm/s LV PW:          1.37 cm     LV E/e' medial:  45.1 LV IVS:        1.30 cm     LV e' lateral:   5.44 cm/s LVOT diam:     1.90 cm     LV E/e' lateral: 27.9 LV SV:         45 LV SV Index:   32 LVOT Area:     2.84 cm  LV Volumes (MOD) LV vol  d, MOD A2C: 56.7 ml LV vol d, MOD A4C: 80.3 ml LV vol s, MOD A2C: 29.3 ml LV vol s, MOD A4C: 36.7 ml LV SV MOD A2C:     27.4 ml LV SV MOD A4C:     80.3 ml LV SV MOD BP:      35.9 ml RIGHT VENTRICLE RV Basal diam:  3.87 cm LEFT ATRIUM             Index       RIGHT ATRIUM           Index LA diam:        4.00 cm 2.83 cm/m  RA Area:     12.20 cm LA Vol (A2C):   54.3 ml 38.46 ml/m RA Volume:   27.20 ml  19.27 ml/m LA Vol (A4C):   28.8 ml 20.40 ml/m LA Biplane Vol: 43.2 ml 30.60 ml/m  AORTIC VALVE AV Area (Vmax):    1.66 cm AV Area (Vmean):   1.72 cm AV Area (VTI):     1.72 cm AV Vmax:           129.00 cm/s AV Vmean:          82.600 cm/s AV VTI:            0.262 m AV Peak Grad:      6.7 mmHg AV Mean Grad:      3.0 mmHg LVOT Vmax:         75.50 cm/s LVOT Vmean:        50.100 cm/s LVOT VTI:          0.159 m LVOT/AV VTI ratio: 0.61  AORTA Ao Root diam: 2.70 cm MITRAL VALVE                TRICUSPID VALVE MV Area (PHT): 3.26 cm     TR Peak grad:   37.2 mmHg MV Area VTI:   1.07 cm     TR Vmax:        305.00 cm/s MV Peak grad:  8.2 mmHg MV Mean grad:  3.0 mmHg     SHUNTS MV Vmax:       1.43 m/s     Systemic VTI:  0.16 m MV Vmean:      74.7 cm/s    Systemic Diam: 1.90 cm MV Decel Time: 233 msec MV E velocity: 152.00 cm/s MV A velocity: 130.00 cm/s MV E/A ratio:  1.17 Neoma Laming MD Electronically signed by Neoma Laming MD Signature Date/Time: 05/15/2020/12:30:54 PM    Final      Subjective: Patient was resting comfortably when seen today.  No new complaint.  Easily arousable.  Discharge Exam: Vitals:   05/16/20 0700 05/16/20 1122  BP: (!) 171/67 (!) 168/76  Pulse: 63 60  Resp: 12 16  Temp: 98 F (36.7 C) 98.1 F (36.7 C)  SpO2: 95% 100%   Vitals:   05/15/20 2006 05/16/20 0311  05/16/20 0700 05/16/20 1122  BP: (!) 181/66 (!) 183/77 (!) 171/67 (!) 168/76  Pulse: 63 63 63 60  Resp: 20 20 12 16   Temp:   98 F (36.7 C) 98.1 F (36.7 C)  TempSrc:   Oral Oral  SpO2: 93% 93% 95% 100%  Weight:        General: Pt is alert, awake, not in acute distress Cardiovascular: RRR, S1/S2 +, no rubs, no gallops Respiratory: CTA bilaterally, no wheezing, no rhonchi Abdominal: Soft, NT, ND, bowel sounds + Extremities: Bilateral BKA  The  results of significant diagnostics from this hospitalization (including imaging, microbiology, ancillary and laboratory) are listed below for reference.    Microbiology: Recent Results (from the past 240 hour(s))  Resp Panel by RT-PCR (Flu A&B, Covid) Nasopharyngeal Swab     Status: None   Collection Time: 05/10/20  9:44 AM   Specimen: Nasopharyngeal Swab; Nasopharyngeal(NP) swabs in vial transport medium  Result Value Ref Range Status   SARS Coronavirus 2 by RT PCR NEGATIVE NEGATIVE Final    Comment: (NOTE) SARS-CoV-2 target nucleic acids are NOT DETECTED.  The SARS-CoV-2 RNA is generally detectable in upper respiratory specimens during the acute phase of infection. The lowest concentration of SARS-CoV-2 viral copies this assay can detect is 138 copies/mL. A negative result does not preclude SARS-Cov-2 infection and should not be used as the sole basis for treatment or other patient management decisions. A negative result may occur with  improper specimen collection/handling, submission of specimen other than nasopharyngeal swab, presence of viral mutation(s) within the areas targeted by this assay, and inadequate number of viral copies(<138 copies/mL). A negative result must be combined with clinical observations, patient history, and epidemiological information. The expected result is Negative.  Fact Sheet for Patients:  EntrepreneurPulse.com.au  Fact Sheet for Healthcare Providers:   IncredibleEmployment.be  This test is no t yet approved or cleared by the [place] FDA and  has been authorized for detection and/or diagnosis of SARS-CoV-2 by FDA under an Emergency Use Authorization (EUA). This EUA will remain  in effect (meaning this test can be used) for the duration of the COVID-19 declaration under Section 564(b)(1) of the Act, 21 U.S.C.section 360bbb-3(b)(1), unless the authorization is terminated  or revoked sooner.       Influenza A by PCR NEGATIVE NEGATIVE Final   Influenza B by PCR NEGATIVE NEGATIVE Final    Comment: (NOTE) The Xpert Xpress SARS-CoV-2/FLU/RSV plus assay is intended as an aid in the diagnosis of influenza from Nasopharyngeal swab specimens and should not be used as a sole basis for treatment. Nasal washings and aspirates are unacceptable for Xpert Xpress SARS-CoV-2/FLU/RSV testing.  Fact Sheet for Patients: EntrepreneurPulse.com.au  Fact Sheet for Healthcare Providers: IncredibleEmployment.be  This test is not yet approved or cleared by the [place] FDA and has been authorized for detection and/or diagnosis of SARS-CoV-2 by FDA under an Emergency Use Authorization (EUA). This EUA will remain in effect (meaning this test can be used) for the duration of the COVID-19 declaration under Section 564(b)(1) of the Act, 21 U.S.C. section 360bbb-3(b)(1), unless the authorization is terminated or revoked.  Performed at [facility], [address] [place], [place] [address]   SARS Coronavirus 2 by RT PCR (hospital order, performed in [facility] hospital lab) Nasopharyngeal Nasopharyngeal Swab     Status: None   Collection Time: 05/13/20  6:08 PM   Specimen: Nasopharyngeal Swab  Result Value Ref Range Status   SARS Coronavirus 2 NEGATIVE NEGATIVE Final    Comment: (NOTE) SARS-CoV-2 target nucleic acids are NOT DETECTED.  The SARS-CoV-2 RNA is generally detectable  in upper and lower respiratory specimens during the acute phase of infection. The lowest concentration of SARS-CoV-2 viral copies this assay can detect is 250 copies / mL. A negative result does not preclude SARS-CoV-2 infection and should not be used as the sole basis for treatment or other patient management decisions.  A negative result may occur with improper specimen collection / handling, submission of specimen other than nasopharyngeal swab, presence of viral mutation(s)  within the areas targeted by this assay, and inadequate number of viral copies (<250 copies / mL). A negative result must be combined with clinical observations, patient history, and epidemiological information.  Fact Sheet for Patients:   StrictlyIdeas.no  Fact Sheet for Healthcare Providers: BankingDealers.co.za  This test is not yet approved or  cleared by the [place] FDA and has been authorized for detection and/or diagnosis of SARS-CoV-2 by FDA under an Emergency Use Authorization (EUA).  This EUA will remain in effect (meaning this test can be used) for the duration of the COVID-19 declaration under Section 564(b)(1) of the Act, 21 U.S.C. section 360bbb-3(b)(1), unless the authorization is terminated or revoked sooner.  Performed at [facility], [address] [place],  [address]      Labs: BNP (last 3 results) Recent Labs    09/02/19 1555 05/10/20 0944  BNP >4,500.0* 2,878.6*   Basic Metabolic Panel: Recent Labs  Lab 05/13/20 2232 05/13/20 2233 05/14/20 0110 05/14/20 0500 05/14/20 0858 05/14/20 1541 05/14/20 2027 05/15/20 0321 05/16/20 0605  NA  --  140  --  142 141  --   --  140 139  K  --  6.4*   < > 5.8* 5.3* 4.0 4.0 4.2 3.6  CL  --  97*  --  97* 99  --   --  94* 98  CO2  --  27  --  27 26  --   --  30 27  GLUCOSE  --  140*  --  88 87  --   --  166* 135*  BUN  --  79*  --  83* 82*  --   --  45* 24*   CREATININE  --  10.99*  --  11.27* 11.67*  --   --  7.67* 5.24*  CALCIUM  --  8.3*  --  8.1* 7.9*  --   --  7.8* 7.8*  MG 2.3  --   --   --   --   --   --  2.0  --   PHOS  --   --   --   --  5.9*  --   --  4.5  --    < > = values in this interval not displayed.   Liver Function Tests: Recent Labs  Lab 05/10/20 0944 05/13/20 1803 05/13/20 2233 05/14/20 0858 05/15/20 0321  AST 21 24 20   --  16  ALT 27 19 17   --  14  ALKPHOS 88 75 71  --  69  BILITOT 0.8 0.8 0.7  --  0.7  PROT 6.9 6.8 6.6  --  6.5  ALBUMIN 3.5 3.2* 3.2* 3.0* 3.1*   No results for input(s): LIPASE, AMYLASE in the last 168 hours. No results for input(s): AMMONIA in the last 168 hours. CBC: Recent Labs  Lab 05/10/20 0944 05/13/20 1803 05/14/20 0500 05/14/20 0858 05/15/20 0321  WBC 4.5 4.6 3.7* 3.7* 3.5*  NEUTROABS 3.3 3.2  --   --  2.3  HGB 9.5* 8.4* 8.0* 8.2* 8.2*  HCT 31.5* 28.5* 26.7* 27.0* 27.3*  MCV 100.6* 101.8* 100.0 100.4* 98.9  PLT 84* 71* 71* 74* 73*   Cardiac Enzymes: Recent Labs  Lab 05/14/20 0110  CKTOTAL 153   BNP: Invalid input(s): POCBNP CBG: Recent Labs  Lab 05/15/20 0727 05/15/20 1156 05/15/20 2011 05/16/20 0752 05/16/20 1133  GLUCAP 149* 133* 97 124* 128*   D-Dimer No results for input(s): DDIMER in the last 72 hours.  Hgb A1c No results for input(s): HGBA1C in the last 72 hours. Lipid Profile No results for input(s): CHOL, HDL, LDLCALC, TRIG, CHOLHDL, LDLDIRECT in the last 72 hours. Thyroid function studies No results for input(s): TSH, T4TOTAL, T3FREE, THYROIDAB in the last 72 hours.  Invalid input(s): FREET3 Anemia work up Recent Labs    05/13/20 2233 05/15/20 0321 05/15/20 0511  VITAMINB12 2,095*  --  1,681*  FOLATE 7.3 5.4*  --   FERRITIN  --  1,384*  --   TIBC  --  164*  --   IRON  --  52  --   RETICCTPCT  --   --  1.3   Urinalysis    Component Value Date/Time   COLORURINE RED (A) 10/17/2016 1643   APPEARANCEUR TURBID (A) 10/17/2016 1643   LABSPEC  1.014 10/17/2016 1643   PHURINE  10/17/2016 1643    TEST NOT REPORTED DUE TO COLOR INTERFERENCE OF URINE PIGMENT   GLUCOSEU (A) 10/17/2016 1643    TEST NOT REPORTED DUE TO COLOR INTERFERENCE OF URINE PIGMENT   HGBUR (A) 10/17/2016 1643    TEST NOT REPORTED DUE TO COLOR INTERFERENCE OF URINE PIGMENT   BILIRUBINUR (A) 10/17/2016 1643    TEST NOT REPORTED DUE TO COLOR INTERFERENCE OF URINE PIGMENT   KETONESUR (A) 10/17/2016 1643    TEST NOT REPORTED DUE TO COLOR INTERFERENCE OF URINE PIGMENT   PROTEINUR (A) 10/17/2016 1643    TEST NOT REPORTED DUE TO COLOR INTERFERENCE OF URINE PIGMENT   NITRITE (A) 10/17/2016 1643    TEST NOT REPORTED DUE TO COLOR INTERFERENCE OF URINE PIGMENT   LEUKOCYTESUR (A) 10/17/2016 1643    TEST NOT REPORTED DUE TO COLOR INTERFERENCE OF URINE PIGMENT   Sepsis Labs Invalid input(s): PROCALCITONIN,  WBC,  LACTICIDVEN Microbiology Recent Results (from the past 240 hour(s))  Resp Panel by RT-PCR (Flu A&B, Covid) Nasopharyngeal Swab     Status: None   Collection Time: 05/10/20  9:44 AM   Specimen: Nasopharyngeal Swab; Nasopharyngeal(NP) swabs in vial transport medium  Result Value Ref Range Status   SARS Coronavirus 2 by RT PCR NEGATIVE NEGATIVE Final    Comment: (NOTE) SARS-CoV-2 target nucleic acids are NOT DETECTED.  The SARS-CoV-2 RNA is generally detectable in upper respiratory specimens during the acute phase of infection. The lowest concentration of SARS-CoV-2 viral copies this assay can detect is 138 copies/mL. A negative result does not preclude SARS-Cov-2 infection and should not be used as the sole basis for treatment or other patient management decisions. A negative result may occur with  improper specimen collection/handling, submission of specimen other than nasopharyngeal swab, presence of viral mutation(s) within the areas targeted by this assay, and inadequate number of viral copies(<138 copies/mL). A negative result must be combined  with clinical observations, patient history, and epidemiological information. The expected result is Negative.  Fact Sheet for Patients:  EntrepreneurPulse.com.au  Fact Sheet for Healthcare Providers:  IncredibleEmployment.be  This test is no t yet approved or cleared by the [place] FDA and  has been authorized for detection and/or diagnosis of SARS-CoV-2 by FDA under an Emergency Use Authorization (EUA). This EUA will remain  in effect (meaning this test can be used) for the duration of the COVID-19 declaration under Section 564(b)(1) of the Act, 21 U.S.C.section 360bbb-3(b)(1), unless the authorization is terminated  or revoked sooner.       Influenza A by PCR NEGATIVE NEGATIVE Final   Influenza B by PCR NEGATIVE NEGATIVE Final  Comment: (NOTE) The Xpert Xpress SARS-CoV-2/FLU/RSV plus assay is intended as an aid in the diagnosis of influenza from Nasopharyngeal swab specimens and should not be used as a sole basis for treatment. Nasal washings and aspirates are unacceptable for Xpert Xpress SARS-CoV-2/FLU/RSV testing.  Fact Sheet for Patients: EntrepreneurPulse.com.au  Fact Sheet for Healthcare Providers: IncredibleEmployment.be  This test is not yet approved or cleared by the [place] FDA and has been authorized for detection and/or diagnosis of SARS-CoV-2 by FDA under an Emergency Use Authorization (EUA). This EUA will remain in effect (meaning this test can be used) for the duration of the COVID-19 declaration under Section 564(b)(1) of the Act, 21 U.S.C. section 360bbb-3(b)(1), unless the authorization is terminated or revoked.  Performed at [facility], [address] [place], [place] [address]   SARS Coronavirus 2 by RT PCR (hospital order, performed in [facility] hospital lab) Nasopharyngeal Nasopharyngeal Swab     Status: None   Collection Time: 05/13/20  6:08 PM    Specimen: Nasopharyngeal Swab  Result Value Ref Range Status   SARS Coronavirus 2 NEGATIVE NEGATIVE Final    Comment: (NOTE) SARS-CoV-2 target nucleic acids are NOT DETECTED.  The SARS-CoV-2 RNA is generally detectable in upper and lower respiratory specimens during the acute phase of infection. The lowest concentration of SARS-CoV-2 viral copies this assay can detect is 250 copies / mL. A negative result does not preclude SARS-CoV-2 infection and should not be used as the sole basis for treatment or other patient management decisions.  A negative result may occur with improper specimen collection / handling, submission of specimen other than nasopharyngeal swab, presence of viral mutation(s) within the areas targeted by this assay, and inadequate number of viral copies (<250 copies / mL). A negative result must be combined with clinical observations, patient history, and epidemiological information.  Fact Sheet for Patients:   StrictlyIdeas.no  Fact Sheet for Healthcare Providers: BankingDealers.co.za  This test is not yet approved or  cleared by the [place] FDA and has been authorized for detection and/or diagnosis of SARS-CoV-2 by FDA under an Emergency Use Authorization (EUA).  This EUA will remain in effect (meaning this test can be used) for the duration of the COVID-19 declaration under Section 564(b)(1) of the Act, 21 U.S.C. section 360bbb-3(b)(1), unless the authorization is terminated or revoked sooner.  Performed at [facility], [address] [place], [place] [address]     Time coordinating discharge: Over 30 minutes  SIGNED:  Lorella Nimrod, MD  [facility] 05/16/2020, 2:01 PM  If 7PM-7AM, please contact night-coverage [email or web address]  This record has been created using Systems analyst. Errors have been sought and corrected,but may not always be located. Such creation  errors do not reflect on the standard of care.

## 2020-05-16 NOTE — TOC Transition Note (Signed)
Transition of Care [facility]) - CM/SW Discharge Note   Patient Details  Name: Melodi Happel MRN: [id number] Date of Birth: 1950/11/05  Transition of Care [facility]) CM/SW Contact:  Adelene Amas, LCSWA Phone Number:[phone] 05/16/2020, 2:07 PM   Clinical Narrative:     Patient will d/c back to [facility].  CSW called on behalf of Attending and left voicemail for Neoma Laming in admissions w/ Attending concerns of the patient not attending dialysis regularly.  CSW left contact information incase she wanted to speak with me.  ED secretary will set up [facility] consult completed.        Patient Goals and CMS Choice        Discharge Placement                       Discharge Plan and Services                                     Social Determinants of Health (SDOH) Interventions     Readmission Risk Interventions Readmission Risk Prevention Plan 04/11/2020 05/04/2019  Transportation Screening Complete Complete  PCP or Specialist Appt within 3-5 Days - Complete  Palliative Care Screening - Not Applicable  Medication Review ([place]) Complete Complete  PCP or Specialist appointment within 3-5 days of discharge Complete -  [place] or Home Care Consult Complete -  SW Recovery Care/Counseling Consult Complete -  Palliative Care Screening Not Applicable -  Skilled Nursing Facility Complete -  Some recent data might be hidden

## 2020-05-16 NOTE — Progress Notes (Signed)
[facility]  ROUNDING NOTE   Subjective:   Hemodialysis treatment yesterday and day prior. Tolerated treatment well yesterday. No ultrafiltration.   Objective:  Vital signs in last 24 hours:  Temp:  [98 F (36.7 C)] 98 F (36.7 C) (01/25 0700) Pulse Rate:  [60-64] 63 (01/25 0700) Resp:  [10-20] 12 (01/25 0700) BP: (144-202)/(61-77) 171/67 (01/25 0700) SpO2:  [93 %-100 %] 95 % (01/25 0700)  Weight change:  Filed Weights   05/13/20 1914  Weight: 60 kg    Intake/Output: No intake/output data recorded.   Intake/Output this shift:  No intake/output data recorded.  Physical Exam: General: NAD, laying in bed  Head: Normocephalic, atraumatic. Moist oral mucosal membranes  Eyes: Anicteric, PERRL  Neck: Supple, trachea midline  Lungs:  Clear to auscultation  Heart: Regular rate and rhythm  Abdomen:  Soft, nontender,   Extremities: Bilateral BKA  Neurologic: Nonfocal, moving all four extremities  Skin: No lesions  Access: RIJ permcath    Basic Metabolic Panel: Recent Labs  Lab 05/13/20 2232 05/13/20 2233 05/14/20 0110 05/14/20 0500 05/14/20 0858 05/14/20 1541 05/14/20 2027 05/15/20 0321 05/16/20 0605  NA  --  140  --  142 141  --   --  140 139  K  --  6.4*   < > 5.8* 5.3* 4.0 4.0 4.2 3.6  CL  --  97*  --  97* 99  --   --  94* 98  CO2  --  27  --  27 26  --   --  30 27  GLUCOSE  --  140*  --  88 87  --   --  166* 135*  BUN  --  79*  --  83* 82*  --   --  45* 24*  CREATININE  --  10.99*  --  11.27* 11.67*  --   --  7.67* 5.24*  CALCIUM  --  8.3*  --  8.1* 7.9*  --   --  7.8* 7.8*  MG 2.3  --   --   --   --   --   --  2.0  --   PHOS  --   --   --   --  5.9*  --   --  4.5  --    < > = values in this interval not displayed.    Liver Function Tests: Recent Labs  Lab 05/10/20 0944 05/13/20 1803 05/13/20 2233 05/14/20 0858 05/15/20 0321  AST 21 24 20   --  16  ALT 27 19 17   --  14  ALKPHOS 88 75 71  --  69  BILITOT 0.8 0.8 0.7  --  0.7  PROT  6.9 6.8 6.6  --  6.5  ALBUMIN 3.5 3.2* 3.2* 3.0* 3.1*   No results for input(s): LIPASE, AMYLASE in the last 168 hours. No results for input(s): AMMONIA in the last 168 hours.  CBC: Recent Labs  Lab 05/10/20 0944 05/13/20 1803 05/14/20 0500 05/14/20 0858 05/15/20 0321  WBC 4.5 4.6 3.7* 3.7* 3.5*  NEUTROABS 3.3 3.2  --   --  2.3  HGB 9.5* 8.4* 8.0* 8.2* 8.2*  HCT 31.5* 28.5* 26.7* 27.0* 27.3*  MCV 100.6* 101.8* 100.0 100.4* 98.9  PLT 84* 71* 71* 74* 73*    Cardiac Enzymes: Recent Labs  Lab 05/14/20 0110  CKTOTAL 153    BNP: Invalid input(s): POCBNP  CBG: Recent Labs  Lab 05/15/20 0209 05/15/20 0727 05/15/20 1156 05/15/20 2011 05/16/20 0881  GLUCAP 196* 222* 979* 89 211*    Microbiology: Results for orders placed or performed during the hospital encounter of 05/13/20  SARS Coronavirus 2 by RT PCR (hospital order, performed in [facility] hospital lab) Nasopharyngeal Nasopharyngeal Swab     Status: None   Collection Time: 05/13/20  6:08 PM   Specimen: Nasopharyngeal Swab  Result Value Ref Range Status   SARS Coronavirus 2 NEGATIVE NEGATIVE Final    Comment: (NOTE) SARS-CoV-2 target nucleic acids are NOT DETECTED.  The SARS-CoV-2 RNA is generally detectable in upper and lower respiratory specimens during the acute phase of infection. The lowest concentration of SARS-CoV-2 viral copies this assay can detect is 250 copies / mL. A negative result does not preclude SARS-CoV-2 infection and should not be used as the sole basis for treatment or other patient management decisions.  A negative result may occur with improper specimen collection / handling, submission of specimen other than nasopharyngeal swab, presence of viral mutation(s) within the areas targeted by this assay, and inadequate number of viral copies (<250 copies / mL). A negative result must be combined with clinical observations, patient history, and epidemiological information.  Fact Sheet for  Patients:   StrictlyIdeas.no  Fact Sheet for Healthcare Providers: BankingDealers.co.za  This test is not yet approved or  cleared by the [place] FDA and has been authorized for detection and/or diagnosis of SARS-CoV-2 by FDA under an Emergency Use Authorization (EUA).  This EUA will remain in effect (meaning this test can be used) for the duration of the COVID-19 declaration under Section 564(b)(1) of the Act, 21 U.S.C. section 360bbb-3(b)(1), unless the authorization is terminated or revoked sooner.  Performed at [facility], [address] [place], [place] [address]     Coagulation Studies: No results for input(s): LABPROT, INR in the last 72 hours.  Urinalysis: No results for input(s): COLORURINE, LABSPEC, PHURINE, GLUCOSEU, HGBUR, BILIRUBINUR, KETONESUR, PROTEINUR, UROBILINOGEN, NITRITE, LEUKOCYTESUR in the last 72 hours.  Invalid input(s): APPERANCEUR    Imaging: ECHOCARDIOGRAM COMPLETE  Result Date: 05/15/2020    ECHOCARDIOGRAM REPORT   Patient Name:   Stephanie Ellis Date of Exam: 05/15/2020 Medical Rec #:  [id number]     Height:       52.0 in Accession #:    [id number]    Weight:       132.3 lb Date of Birth:  10/06/1950    BSA:          1.412 m Patient Age:    70 years      BP:           119/68 mmHg Patient Gender: F             HR:           62 bpm. Exam Location:  [facility] Procedure: 2D Echo, Color Doppler, Cardiac Doppler and Strain Analysis Indications:     I46.9 Cardiac arrest  History:         Patient has prior history of Echocardiogram examinations.                  HFpEF, Prior CABG, PVD, ESRD; Risk Factors:Diabetes and HCL.  Sonographer:     Charmayne Sheer RDCS (AE) Referring Phys:  [id number] Victoriano Lain A THOMAS Diagnosing Phys: Neoma Laming MD  Sonographer Comments: Global longitudinal strain was attempted. IMPRESSIONS  1. Left ventricular ejection fraction, by estimation, is 30 to 35%. The left ventricle has  severely decreased function. The left ventricle demonstrates global hypokinesis. The  left ventricular internal cavity size was moderately dilated. There is severe  concentric left ventricular hypertrophy. Left ventricular diastolic parameters are consistent with Grade III diastolic dysfunction (restrictive).  2. Right ventricular systolic function is severely reduced. The right ventricular size is severely enlarged. Severely increased right ventricular wall thickness.  3. Left atrial size was severely dilated.  4. Right atrial size was severely dilated.  5. The pericardial effusion is circumferential.  6. The mitral valve is degenerative. Mild mitral valve regurgitation. Severe mitral annular calcification.  7. The tricuspid valve is degenerative.  8. The aortic valve is calcified. Aortic valve regurgitation is mild to moderate. Mild to moderate aortic valve sclerosis/calcification is present, without any evidence of aortic stenosis. FINDINGS  Left Ventricle: Left ventricular ejection fraction, by estimation, is 30 to 35%. The left ventricle has severely decreased function. The left ventricle demonstrates global hypokinesis. The left ventricular internal cavity size was moderately dilated. There is severe concentric left ventricular hypertrophy. Left ventricular diastolic parameters are consistent with Grade III diastolic dysfunction (restrictive). Right Ventricle: The right ventricular size is severely enlarged. Severely increased right ventricular wall thickness. Right ventricular systolic function is severely reduced. Left Atrium: Left atrial size was severely dilated. Right Atrium: Right atrial size was severely dilated. Pericardium: Trivial pericardial effusion is present. The pericardial effusion is circumferential. Mitral Valve: The mitral valve is degenerative in appearance. Severe mitral annular calcification. Mild mitral valve regurgitation. MV peak gradient, 8.2 mmHg. The mean mitral valve gradient is 3.0  mmHg. Tricuspid Valve: The tricuspid valve is degenerative in appearance. Tricuspid valve regurgitation is mild. Aortic Valve: The aortic valve is calcified. Aortic valve regurgitation is mild to moderate. Mild to moderate aortic valve sclerosis/calcification is present, without any evidence of aortic stenosis. Aortic valve mean gradient measures 3.0 mmHg. Aortic valve peak gradient measures 6.7 mmHg. Aortic valve area, by VTI measures 1.72 cm. Pulmonic Valve: The pulmonic valve was not well visualized. Pulmonic valve regurgitation is not visualized. Aorta: The aortic root was not well visualized. IAS/Shunts: No atrial level shunt detected by color flow Doppler.  LEFT VENTRICLE PLAX 2D LVIDd:         4.09 cm     Diastology LVIDs:         3.42 cm     LV e' medial:    3.37 cm/s LV PW:         1.37 cm     LV E/e' medial:  45.1 LV IVS:        1.30 cm     LV e' lateral:   5.44 cm/s LVOT diam:     1.90 cm     LV E/e' lateral: 27.9 LV SV:         45 LV SV Index:   32 LVOT Area:     2.84 cm  LV Volumes (MOD) LV vol d, MOD A2C: 56.7 ml LV vol d, MOD A4C: 80.3 ml LV vol s, MOD A2C: 29.3 ml LV vol s, MOD A4C: 36.7 ml LV SV MOD A2C:     27.4 ml LV SV MOD A4C:     80.3 ml LV SV MOD BP:      35.9 ml RIGHT VENTRICLE RV Basal diam:  3.87 cm LEFT ATRIUM             Index       RIGHT ATRIUM           Index LA diam:        4.00 cm 2.83  cm/m  RA Area:     12.20 cm LA Vol (A2C):   54.3 ml 38.46 ml/m RA Volume:   27.20 ml  19.27 ml/m LA Vol (A4C):   28.8 ml 20.40 ml/m LA Biplane Vol: 43.2 ml 30.60 ml/m  AORTIC VALVE AV Area (Vmax):    1.66 cm AV Area (Vmean):   1.72 cm AV Area (VTI):     1.72 cm AV Vmax:           129.00 cm/s AV Vmean:          82.600 cm/s AV VTI:            0.262 m AV Peak Grad:      6.7 mmHg AV Mean Grad:      3.0 mmHg LVOT Vmax:         75.50 cm/s LVOT Vmean:        50.100 cm/s LVOT VTI:          0.159 m LVOT/AV VTI ratio: 0.61  AORTA Ao Root diam: 2.70 cm MITRAL VALVE                TRICUSPID VALVE MV Area  (PHT): 3.26 cm     TR Peak grad:   37.2 mmHg MV Area VTI:   1.07 cm     TR Vmax:        305.00 cm/s MV Peak grad:  8.2 mmHg MV Mean grad:  3.0 mmHg     SHUNTS MV Vmax:       1.43 m/s     Systemic VTI:  0.16 m MV Vmean:      74.7 cm/s    Systemic Diam: 1.90 cm MV Decel Time: 233 msec MV E velocity: 152.00 cm/s MV A velocity: 130.00 cm/s MV E/A ratio:  1.17 Neoma Laming MD Electronically signed by Neoma Laming MD Signature Date/Time: 05/15/2020/12:30:54 PM    Final      Medications:    . apixaban  2.5 mg Oral BID  . aspirin EC  81 mg Oral Daily  . atorvastatin  80 mg Oral QHS  . brimonidine  1 drop Both Eyes TID  . carvedilol  25 mg Oral BID WC  . Chlorhexidine Gluconate Cloth  6 each Topical Q0600  . epoetin (EPOGEN/PROCRIT) injection  10,000 Units Intravenous Q M,W,F-HD  . fluticasone  2 spray Each Nare Daily  . insulin aspart  0-6 Units Subcutaneous TID WC  . isosorbide mononitrate  60 mg Oral Daily  . levETIRAcetam  250 mg Oral Q M,W,F  . levETIRAcetam  500 mg Oral BID  . mirtazapine  7.5 mg Oral Daily  . pantoprazole  40 mg Oral Daily  . sevelamer carbonate  1,600 mg Oral TID WC   acetaminophen, docusate sodium, ipratropium-albuterol, ondansetron (ZOFRAN) IV, polyethylene glycol  Assessment/ Plan:  [name] is a 80 y.o. black female with end stage renal disease on hemodialysis, hypertension, diabetes mellitus type II, coronary artery disease status post CABG, peripheral vascular disease status post bilateral lower extremity amputations, GERD, congestive heart failure, hyperlipidemia, who was admitted to [facility] on 05/13/2020 for Cardiac arrest [facility]) [I46.9]  [facility] Nephrology MWF [facility] [address] RIJ permcath 52kg  1. End Stage Renal Disease on hemodialysis - Continue MWF schedule  2. Anemia of chronic kidney disease: hemoglobin 8.2. Gets mircera as outpatient.  EPO with MWF schedule.   3. Secondary Hyperparathyroidism:  - sevelamer with meals.   4. Hypertension: with  history of hypotension on dialysis treatments.  Home regimen of hydralazine, carvedilol, losartan and isosorbide mononitrate.    LOS: 3 Zayven Powe 1/25/20228:46 AM

## 2020-05-16 NOTE — ED Notes (Signed)
Report called to [facility] at [facility] for patient return to facility.

## 2020-05-20 ENCOUNTER — Emergency Department: Payer: Medicare Other

## 2020-05-20 ENCOUNTER — Other Ambulatory Visit: Payer: Self-pay

## 2020-05-20 ENCOUNTER — Inpatient Hospital Stay
Admission: EM | Admit: 2020-05-20 | Discharge: 2020-05-23 | DRG: 280 | Disposition: A | Discharge status: Skilled Nursing Facility | Payer: Medicare Other | Source: Skilled Nursing Facility | Attending: Internal Medicine | Admitting: Internal Medicine

## 2020-05-20 ENCOUNTER — Encounter: Payer: Self-pay | Admitting: Emergency Medicine

## 2020-05-20 DIAGNOSIS — R9431 Abnormal electrocardiogram [ECG] [EKG]: Secondary | ICD-10-CM

## 2020-05-20 DIAGNOSIS — I2510 Atherosclerotic heart disease of native coronary artery without angina pectoris: Secondary | ICD-10-CM | POA: Diagnosis present

## 2020-05-20 DIAGNOSIS — I482 Chronic atrial fibrillation, unspecified: Secondary | ICD-10-CM | POA: Diagnosis not present

## 2020-05-20 DIAGNOSIS — E785 Hyperlipidemia, unspecified: Secondary | ICD-10-CM | POA: Diagnosis not present

## 2020-05-20 DIAGNOSIS — Z9115 Patient's noncompliance with renal dialysis: Secondary | ICD-10-CM

## 2020-05-20 DIAGNOSIS — I5042 Chronic combined systolic (congestive) and diastolic (congestive) heart failure: Secondary | ICD-10-CM | POA: Diagnosis not present

## 2020-05-20 DIAGNOSIS — F32A Depression, unspecified: Secondary | ICD-10-CM

## 2020-05-20 DIAGNOSIS — Z7982 Long term (current) use of aspirin: Secondary | ICD-10-CM

## 2020-05-20 DIAGNOSIS — I214 Non-ST elevation (NSTEMI) myocardial infarction: Secondary | ICD-10-CM | POA: Diagnosis not present

## 2020-05-20 DIAGNOSIS — Z8249 Family history of ischemic heart disease and other diseases of the circulatory system: Secondary | ICD-10-CM

## 2020-05-20 DIAGNOSIS — Z961 Presence of intraocular lens: Secondary | ICD-10-CM | POA: Diagnosis present

## 2020-05-20 DIAGNOSIS — E1129 Type 2 diabetes mellitus with other diabetic kidney complication: Secondary | ICD-10-CM | POA: Diagnosis present

## 2020-05-20 DIAGNOSIS — I959 Hypotension, unspecified: Secondary | ICD-10-CM | POA: Diagnosis not present

## 2020-05-20 DIAGNOSIS — Z885 Allergy status to narcotic agent status: Secondary | ICD-10-CM

## 2020-05-20 DIAGNOSIS — I132 Hypertensive heart and chronic kidney disease with heart failure and with stage 5 chronic kidney disease, or end stage renal disease: Secondary | ICD-10-CM | POA: Diagnosis present

## 2020-05-20 DIAGNOSIS — J9611 Chronic respiratory failure with hypoxia: Secondary | ICD-10-CM | POA: Diagnosis present

## 2020-05-20 DIAGNOSIS — R569 Unspecified convulsions: Secondary | ICD-10-CM

## 2020-05-20 DIAGNOSIS — Z992 Dependence on renal dialysis: Secondary | ICD-10-CM | POA: Diagnosis not present

## 2020-05-20 DIAGNOSIS — Z9842 Cataract extraction status, left eye: Secondary | ICD-10-CM

## 2020-05-20 DIAGNOSIS — I639 Cerebral infarction, unspecified: Secondary | ICD-10-CM | POA: Diagnosis present

## 2020-05-20 DIAGNOSIS — I953 Hypotension of hemodialysis: Secondary | ICD-10-CM | POA: Diagnosis present

## 2020-05-20 DIAGNOSIS — I472 Ventricular tachycardia, unspecified: Secondary | ICD-10-CM

## 2020-05-20 DIAGNOSIS — E1151 Type 2 diabetes mellitus with diabetic peripheral angiopathy without gangrene: Secondary | ICD-10-CM | POA: Diagnosis present

## 2020-05-20 DIAGNOSIS — Z7901 Long term (current) use of anticoagulants: Secondary | ICD-10-CM

## 2020-05-20 DIAGNOSIS — I252 Old myocardial infarction: Secondary | ICD-10-CM | POA: Diagnosis not present

## 2020-05-20 DIAGNOSIS — E1122 Type 2 diabetes mellitus with diabetic chronic kidney disease: Secondary | ICD-10-CM | POA: Diagnosis present

## 2020-05-20 DIAGNOSIS — Z794 Long term (current) use of insulin: Secondary | ICD-10-CM

## 2020-05-20 DIAGNOSIS — D631 Anemia in chronic kidney disease: Secondary | ICD-10-CM | POA: Diagnosis present

## 2020-05-20 DIAGNOSIS — Z8673 Personal history of transient ischemic attack (TIA), and cerebral infarction without residual deficits: Secondary | ICD-10-CM

## 2020-05-20 DIAGNOSIS — E7800 Pure hypercholesterolemia, unspecified: Secondary | ICD-10-CM | POA: Diagnosis present

## 2020-05-20 DIAGNOSIS — Z888 Allergy status to other drugs, medicaments and biological substances status: Secondary | ICD-10-CM

## 2020-05-20 DIAGNOSIS — Z20822 Contact with and (suspected) exposure to covid-19: Secondary | ICD-10-CM | POA: Diagnosis present

## 2020-05-20 DIAGNOSIS — I4729 Other ventricular tachycardia: Secondary | ICD-10-CM

## 2020-05-20 DIAGNOSIS — K219 Gastro-esophageal reflux disease without esophagitis: Secondary | ICD-10-CM | POA: Diagnosis present

## 2020-05-20 DIAGNOSIS — Z8674 Personal history of sudden cardiac arrest: Secondary | ICD-10-CM | POA: Diagnosis not present

## 2020-05-20 DIAGNOSIS — Z833 Family history of diabetes mellitus: Secondary | ICD-10-CM

## 2020-05-20 DIAGNOSIS — Z9841 Cataract extraction status, right eye: Secondary | ICD-10-CM

## 2020-05-20 DIAGNOSIS — Z89511 Acquired absence of right leg below knee: Secondary | ICD-10-CM

## 2020-05-20 DIAGNOSIS — Z89512 Acquired absence of left leg below knee: Secondary | ICD-10-CM

## 2020-05-20 DIAGNOSIS — Z79899 Other long term (current) drug therapy: Secondary | ICD-10-CM

## 2020-05-20 DIAGNOSIS — Z951 Presence of aortocoronary bypass graft: Secondary | ICD-10-CM | POA: Diagnosis not present

## 2020-05-20 DIAGNOSIS — N186 End stage renal disease: Secondary | ICD-10-CM

## 2020-05-20 DIAGNOSIS — I10 Essential (primary) hypertension: Secondary | ICD-10-CM | POA: Diagnosis present

## 2020-05-20 DIAGNOSIS — Z90710 Acquired absence of both cervix and uterus: Secondary | ICD-10-CM

## 2020-05-20 DIAGNOSIS — N2581 Secondary hyperparathyroidism of renal origin: Secondary | ICD-10-CM | POA: Diagnosis present

## 2020-05-20 HISTORY — DX: Depression, unspecified: F32.A

## 2020-05-20 LAB — BASIC METABOLIC PANEL
Anion gap: 15 (ref 5–15)
BUN: 17 mg/dL (ref 8–23)
CO2: 29 mmol/L (ref 22–32)
Calcium: 8.4 mg/dL — ABNORMAL LOW (ref 8.9–10.3)
Chloride: 95 mmol/L — ABNORMAL LOW (ref 98–111)
Creatinine, Ser: 3.85 mg/dL — ABNORMAL HIGH (ref 0.44–1.00)
GFR, Estimated: 12 mL/min — ABNORMAL LOW (ref >60)
Glucose, Bld: 156 mg/dL — ABNORMAL HIGH (ref 70–99)
Potassium: 4.5 mmol/L (ref 3.5–5.1)
Sodium: 139 mmol/L (ref 135–145)

## 2020-05-20 LAB — PROTIME-INR
INR: 1.4 — ABNORMAL HIGH (ref 0.8–1.2)
Prothrombin Time: 16.3 seconds — ABNORMAL HIGH (ref 11.4–15.2)

## 2020-05-20 LAB — CBG MONITORING, ED
Glucose-Capillary: 100 mg/dL — ABNORMAL HIGH (ref 70–99)
Glucose-Capillary: 111 mg/dL — ABNORMAL HIGH (ref 70–99)
Glucose-Capillary: 103 mg/dL — ABNORMAL HIGH (ref 70–99)
Glucose-Capillary: 132 mg/dL — ABNORMAL HIGH (ref 70–99)

## 2020-05-20 LAB — CBC
HCT: 27.8 % — ABNORMAL LOW (ref 36.0–46.0)
Hemoglobin: 8.5 g/dL — ABNORMAL LOW (ref 12.0–15.0)
MCH: 30.2 pg (ref 26.0–34.0)
MCHC: 30.6 g/dL (ref 30.0–36.0)
MCV: 98.9 fL (ref 80.0–100.0)
Platelets: 138 10*3/uL — ABNORMAL LOW (ref 150–400)
RBC: 2.81 MIL/uL — ABNORMAL LOW (ref 3.87–5.11)
RDW: 17.7 % — ABNORMAL HIGH (ref 11.5–15.5)
WBC: 5.5 10*3/uL (ref 4.0–10.5)
nRBC: 0 % (ref 0.0–0.2)

## 2020-05-20 LAB — HEPARIN LEVEL (UNFRACTIONATED): Heparin Unfractionated: 2.78 IU/mL — ABNORMAL HIGH (ref 0.30–0.70)

## 2020-05-20 LAB — TROPONIN I (HIGH SENSITIVITY)
Troponin I (High Sensitivity): 598 ng/L (ref <18)
Troponin I (High Sensitivity): 926 ng/L (ref <18)
Troponin I (High Sensitivity): 1697 ng/L (ref <18)
Troponin I (High Sensitivity): 2037 ng/L (ref <18)
Troponin I (High Sensitivity): 2084 ng/L (ref <18)

## 2020-05-20 LAB — APTT
aPTT: 32 seconds (ref 24–36)
aPTT: 38 seconds — ABNORMAL HIGH (ref 24–36)
aPTT: 40 seconds — ABNORMAL HIGH (ref 24–36)

## 2020-05-20 LAB — SARS CORONAVIRUS 2 BY RT PCR (HOSPITAL ORDER, PERFORMED IN ~~LOC~~ HOSPITAL LAB): SARS Coronavirus 2: NEGATIVE

## 2020-05-20 LAB — MAGNESIUM: Magnesium: 2 mg/dL (ref 1.7–2.4)

## 2020-05-20 MED ORDER — LORAZEPAM 2 MG/ML IJ SOLN: 2.0000 mg | INTRAMUSCULAR | Status: DC | PRN

## 2020-05-20 MED ORDER — HEPARIN BOLUS VIA INFUSION
1200.0000 [IU] | Freq: Once | INTRAVENOUS | Status: AC
  Administered 2020-05-20: 1200 [IU] via INTRAVENOUS
  Filled 2020-05-20: qty 1200

## 2020-05-20 MED ORDER — FLUTICASONE PROPIONATE 50 MCG/ACT NA SUSP
2.0000 | Freq: Every day | NASAL | Status: DC
  Administered 2020-05-21 – 2020-05-23 (×3): 2 via NASAL
  Filled 2020-05-20 (×2): qty 16

## 2020-05-20 MED ORDER — HEPARIN BOLUS VIA INFUSION
2400.0000 [IU] | Freq: Once | INTRAVENOUS | Status: AC
  Administered 2020-05-20: 2400 [IU] via INTRAVENOUS
  Filled 2020-05-20: qty 2400

## 2020-05-20 MED ORDER — BRIMONIDINE TARTRATE 0.2 % OP SOLN
1.0000 [drp] | Freq: Three times a day (TID) | OPHTHALMIC | Status: DC
  Administered 2020-05-20 – 2020-05-23 (×7): 1 [drp] via OPHTHALMIC
  Filled 2020-05-20 (×2): qty 5

## 2020-05-20 MED ORDER — LEVETIRACETAM 250 MG PO TABS
250.0000 mg | ORAL_TABLET | ORAL | Status: DC
  Filled 2020-05-20: qty 1

## 2020-05-20 MED ORDER — LEVETIRACETAM 500 MG PO TABS
500.0000 mg | ORAL_TABLET | Freq: Two times a day (BID) | ORAL | Status: DC
  Administered 2020-05-20 – 2020-05-23 (×6): 500 mg via ORAL
  Filled 2020-05-20 (×6): qty 1

## 2020-05-20 MED ORDER — TRIAMCINOLONE ACETONIDE 0.1 % EX CREA
1.0000 "application " | TOPICAL_CREAM | Freq: Every day | CUTANEOUS | Status: DC | PRN
  Filled 2020-05-20: qty 15

## 2020-05-20 MED ORDER — OMEGA-3-ACID ETHYL ESTERS 1 G PO CAPS
2.0000 g | ORAL_CAPSULE | Freq: Every day | ORAL | Status: DC
  Administered 2020-05-21 – 2020-05-23 (×2): 2 g via ORAL
  Filled 2020-05-20 (×2): qty 2

## 2020-05-20 MED ORDER — ACETAMINOPHEN 325 MG PO TABS: 650.0000 mg | ORAL_TABLET | Freq: Four times a day (QID) | ORAL | Status: DC | PRN

## 2020-05-20 MED ORDER — HYDRALAZINE HCL 50 MG PO TABS
75.0000 mg | ORAL_TABLET | Freq: Three times a day (TID) | ORAL | Status: DC
  Administered 2020-05-20 – 2020-05-23 (×8): 75 mg via ORAL
  Filled 2020-05-20: qty 2
  Filled 2020-05-20 (×7): qty 1

## 2020-05-20 MED ORDER — INSULIN ASPART 100 UNIT/ML ~~LOC~~ SOLN
0.0000 [IU] | Freq: Three times a day (TID) | SUBCUTANEOUS | Status: DC
  Administered 2020-05-21: 3 [IU] via SUBCUTANEOUS
  Administered 2020-05-22 (×2): 2 [IU] via SUBCUTANEOUS
  Administered 2020-05-23: 1 [IU] via SUBCUTANEOUS
  Filled 2020-05-20 (×2): qty 1

## 2020-05-20 MED ORDER — ISOSORBIDE MONONITRATE ER 60 MG PO TB24
60.0000 mg | ORAL_TABLET | Freq: Every day | ORAL | Status: DC
  Administered 2020-05-21 – 2020-05-23 (×2): 60 mg via ORAL
  Filled 2020-05-20 (×2): qty 1

## 2020-05-20 MED ORDER — LORAZEPAM 2 MG/ML IJ SOLN: 1.0000 mg | INTRAMUSCULAR | Status: DC | PRN

## 2020-05-20 MED ORDER — PANTOPRAZOLE SODIUM 40 MG PO TBEC
40.0000 mg | DELAYED_RELEASE_TABLET | Freq: Every day | ORAL | Status: DC
  Administered 2020-05-21 – 2020-05-23 (×2): 40 mg via ORAL
  Filled 2020-05-20 (×2): qty 1

## 2020-05-20 MED ORDER — SEVELAMER CARBONATE 800 MG PO TABS: 800.0000 mg | ORAL_TABLET | Freq: Three times a day (TID) | ORAL | Status: DC

## 2020-05-20 MED ORDER — MECLIZINE HCL 25 MG PO TABS
25.0000 mg | ORAL_TABLET | ORAL | Status: DC | PRN
  Filled 2020-05-20: qty 1

## 2020-05-20 MED ORDER — IPRATROPIUM BROMIDE 0.06 % NA SOLN
2.0000 | Freq: Two times a day (BID) | NASAL | Status: DC
  Administered 2020-05-20 – 2020-05-23 (×5): 2 via NASAL
  Filled 2020-05-20 (×2): qty 15

## 2020-05-20 MED ORDER — SENNOSIDES-DOCUSATE SODIUM 8.6-50 MG PO TABS
1.0000 | ORAL_TABLET | Freq: Two times a day (BID) | ORAL | Status: DC
  Administered 2020-05-21 – 2020-05-23 (×3): 1 via ORAL
  Filled 2020-05-20 (×5): qty 1

## 2020-05-20 MED ORDER — ASPIRIN EC 81 MG PO TBEC
81.0000 mg | DELAYED_RELEASE_TABLET | Freq: Every day | ORAL | Status: DC
  Administered 2020-05-20 – 2020-05-23 (×3): 81 mg via ORAL
  Filled 2020-05-20 (×3): qty 1

## 2020-05-20 MED ORDER — VITAMIN B-12 1000 MCG PO TABS
1000.0000 ug | ORAL_TABLET | Freq: Every day | ORAL | Status: DC
  Administered 2020-05-21 – 2020-05-23 (×2): 1000 ug via ORAL
  Filled 2020-05-20 (×2): qty 1

## 2020-05-20 MED ORDER — SEVELAMER CARBONATE 800 MG PO TABS
1600.0000 mg | ORAL_TABLET | Freq: Three times a day (TID) | ORAL | Status: DC
  Administered 2020-05-21 – 2020-05-23 (×5): 1600 mg via ORAL
  Filled 2020-05-20 (×6): qty 2

## 2020-05-20 MED ORDER — LOSARTAN POTASSIUM 50 MG PO TABS
50.0000 mg | ORAL_TABLET | Freq: Every day | ORAL | Status: DC
  Administered 2020-05-21 – 2020-05-23 (×2): 50 mg via ORAL
  Filled 2020-05-20 (×2): qty 1

## 2020-05-20 MED ORDER — INSULIN ASPART 100 UNIT/ML ~~LOC~~ SOLN: 0.0000 [IU] | Freq: Every day | SUBCUTANEOUS | Status: DC

## 2020-05-20 MED ORDER — CARVEDILOL 25 MG PO TABS
25.0000 mg | ORAL_TABLET | Freq: Two times a day (BID) | ORAL | Status: DC
  Administered 2020-05-21 – 2020-05-23 (×4): 25 mg via ORAL
  Filled 2020-05-20 (×4): qty 1

## 2020-05-20 MED ORDER — HYDRALAZINE HCL 20 MG/ML IJ SOLN: 5.0000 mg | INTRAMUSCULAR | Status: DC | PRN

## 2020-05-20 MED ORDER — POLYVINYL ALCOHOL 1.4 % OP SOLN
1.0000 [drp] | Freq: Two times a day (BID) | OPHTHALMIC | Status: DC
  Administered 2020-05-20 – 2020-05-23 (×6): 1 [drp] via OPHTHALMIC
  Filled 2020-05-20 (×2): qty 15

## 2020-05-20 MED ORDER — HEPARIN (PORCINE) 25000 UT/250ML-% IV SOLN
600.0000 [IU]/h | INTRAVENOUS | Status: DC
  Administered 2020-05-20: 500 [IU]/h via INTRAVENOUS
  Filled 2020-05-20: qty 250

## 2020-05-20 MED ORDER — THIAMINE HCL 100 MG PO TABS
100.0000 mg | ORAL_TABLET | Freq: Every day | ORAL | Status: DC
  Administered 2020-05-21 – 2020-05-23 (×2): 100 mg via ORAL
  Filled 2020-05-20 (×2): qty 1

## 2020-05-20 MED ORDER — POLYVINYL ALCOHOL-POVIDONE PF 1.4-0.6 % OP SOLN: 1.0000 [drp] | Freq: Two times a day (BID) | OPHTHALMIC | Status: DC

## 2020-05-20 MED ORDER — ATORVASTATIN CALCIUM 80 MG PO TABS
80.0000 mg | ORAL_TABLET | Freq: Every day | ORAL | Status: DC
Start: 2020-05-20 — End: 2020-05-23
  Administered 2020-05-20 – 2020-05-22 (×3): 80 mg via ORAL
  Filled 2020-05-20 (×2): qty 1
  Filled 2020-05-20: qty 4

## 2020-05-20 MED ORDER — OMEGA-3 1000 MG PO CAPS: 2.0000 | ORAL_CAPSULE | Freq: Every day | ORAL | Status: DC

## 2020-05-20 MED ORDER — TIMOLOL MALEATE 0.5 % OP SOLN
1.0000 [drp] | Freq: Three times a day (TID) | OPHTHALMIC | Status: DC
  Administered 2020-05-20 – 2020-05-23 (×7): 1 [drp] via OPHTHALMIC
  Filled 2020-05-20 (×2): qty 5

## 2020-05-20 NOTE — ED Triage Notes (Signed)
Pt presents to ER from [facility] via EMS, per staff pt's BP pressure this evening 72/44. Pt denies any symptoms in triage, pt is awake, alert and oriented. Pt denies any pain. Pt is a dialysis pt on M/W/F.

## 2020-05-20 NOTE — ED Notes (Signed)
Pt had a run of [facility] on cardiac monitor. Strip printed and given to [employer], MD. IV started, crash cart brought to bedside, and defibrillator pads placed on pt. Pt A&O x 4, denies CP, has no complaints at this time. Will continue to monitor.

## 2020-05-20 NOTE — ED Notes (Signed)
With pts permission, daughter, Ms. Donzetta Sprung, was updated.

## 2020-05-20 NOTE — Consult Note (Signed)
CARDIOLOGY CONSULT NOTE               Patient ID: Stephanie Ellis MRN: [id number] DOB/AGE: 1951-02-08 70 y.o.  Admit date: 05/20/2020 Referring Physician Ivor Costa MD hospitalist Primary Physician None Primary Cardiologist none Reason for Consultation non-STEMI  HPI: Patient 70 year old female multiple multiple medical problems significant PAD with bilateral BKA's atrial fibrillation on Eliquis status post cardiac arrest with PEA felt to be secondary to narcotic abuse coronary disease coronary bypass surgery 2016x3 at [facility] end-stage renal disease on dialysis hypertension.  Patient presented from the nursing home brought to the emergency room when she was found to be hypotensive blood pressure in the 70s normally 140 by time she got to the emergency room her blood pressure was back to normal she had dialysis yesterday and she is occasionally had episodes of hypotension after dialysis she also had episode of diarrhea yesterday after eating a taco no nausea vomiting since no abdominal pain denies any chest pain shortness of breath cough has been relatively asymptomatic she feels relatively normal when she was brought into the emergency room reportedly she may have had 1 episode of nonsustained ventricular tachycardia but no strips are available troponins have risen from 600 up to 2000 other laboratories are relatively unremarkable EKG was nondiagnostic.  Patient had similar presentations in the past known coronary disease and severe vascular disease but luckily the patient has been relatively asymptomatic and hypotension is resolved  Review of systems complete and found to be negative unless listed above     Past Medical History:  Diagnosis Date  . (HFpEF) heart failure with preserved ejection fraction ([place])    a. 01/2018 Echo: EF 50-55%, no rwma, Gr1 DD, triv AI, mild MR. Midly dil LA/RV, mod reduced RV fxn. Irreg thickening of TV w/ mobile echodensity that appears to arise from valve.  .  Anemia   . Anorexia   . Bacteremia due to Pseudomonas   . Carotid arterial disease ([place])    a. 01/2017 Carotid U/S: RICA <23, LICA <76.  Marland Kitchen CHF (congestive heart failure) ([place])   . Complication of anesthesia    a. Pt reports h/o complication on 9 different occasions - ? hypotension/arrest.  . 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 01/2015 Cath [facility]): 70% LM, p-mLAD 50-60% (Resting FFR 0.75), mRCA 80-90%, ~40 Ost OM & D1-->CABG; b. 01/2016 Staged PCI of LCX x 2 and RCA.  . Depression 05/20/2020  . ESRD (end stage renal disease) on dialysis ([place])    a. ESRD secondary to acute kidney failure s/p CABG-->PD  . Essential hypertension   . GERD (gastroesophageal reflux disease)   . Heart murmur   . Hypercholesterolemia   . Myocardial infarction ([place]) 2016  . PVD (peripheral vascular disease) ([place])    a. 01/30/2018 PV Angio: Sev Left Popliteal dzs s/p PTA w/ 1m lutonix DEB w/ mech aspiration of the L popliteal, L AT, and Left tibioperoneal trunck and peroneal artery.  . 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  [facility]: Dr. BMarland KitchenHaithcock: CABG x 3, LIMA to LAD, SVG to RCA, SVG to OM3, EVH  . Sinusitis 2019  . Type II diabetes mellitus with complication (HCC)    CAD    Past Surgical History:  Procedure Laterality Date  . ABDOMINAL HYSTERECTOMY    . AMPUTATION Right 02/25/2018   Procedure: AMPUTATION BELOW KNEE;  Surgeon: SKatha Cabal MD;  Location: [facility] ORS;  Service: Vascular;  Laterality: Right;  .  AMPUTATION Left 03/11/2018   Procedure: AMPUTATION BELOW KNEE;  Surgeon: Algernon Huxley, MD;  Location: [facility] ORS;  Service: Vascular;  Laterality: Left;  . AMPUTATION Bilateral 04/13/2018   Procedure: REVISION OF BILATERAL AMPUTATIONS BELOW KNEE WITH WOUND VAC APPLICATIONS;  Surgeon: Evaristo Bury, MD;  Location: [facility] ORS;  Service: Vascular;  Laterality: Bilateral;  . AMPUTATION TOE Left 02/06/2018   Procedure: AMPUTATION GREAT TOE;  Surgeon: Samara Deist, DPM;   Location: [facility] ORS;  Service: Podiatry;  Laterality: Left;  . APPLICATION OF WOUND VAC Left 04/13/2018   Procedure: APPLICATION OF WOUND VAC;  Surgeon: Evaristo Bury, MD;  Location: [facility] ORS;  Service: Vascular;  Laterality: Left;  . ARTERIOVENOUS GRAFT PLACEMENT  05/2016  . AV FISTULA PLACEMENT Left 05/30/2016   Procedure: ARTERIOVENOUS graft;  Surgeon: Algernon Huxley, MD;  Location: [facility];  Service: Vascular;  Laterality: Left;  . CAPD INSERTION N/A 10/30/2017   Procedure: LAPAROSCOPIC INSERTION CONTINUOUS AMBULATORY PERITONEAL DIALYSIS  (CAPD) CATHETER;  Surgeon: Algernon Huxley, MD;  Location: [facility] OR[facility];  Service: Vascular;  Laterality: N/A;  . CARDIAC CATHETERIZATION  01/2015   [facility]: Ost LM 70%, p-m LAD 50-60% (Rest FFR + @ 0.75), mRCA 80-90%, ostD1 40%, pOM1 40%  . CATARACT EXTRACTION W/PHACO Left 01/18/2016   Procedure: CATARACT EXTRACTION PHACO AND INTRAOCULAR LENS PLACEMENT (IOC);  Surgeon: Eulogio Bear, MD;  Location: [facility] OR[facility];  Service: Ophthalmology;  Laterality: Left;  [place] 1.05 AP% 15.5 CDE 10.16 Fluid Pack Lot # [id number]  . CATARACT EXTRACTION W/PHACO Right 08/01/2016   Procedure: CATARACT EXTRACTION PHACO AND INTRAOCULAR LENS PLACEMENT (IOC);  Surgeon: Eulogio Bear, MD;  Location: [facility];  Service: Ophthalmology;  Laterality: Right;  [place] 01:00.6 AP% 11.4 CDE 6.93  LOT # [id number]  . COLONOSCOPY    . CORONARY ANGIOPLASTY     SENTS 02/12/16  . CORONARY ARTERY BYPASS GRAFT  03/28/15    [facility]: Dr. Waldemar Dickens: LIMA to LAD, SVG to RCA, SVG to OM3, EVH  . DIALYSIS/PERMA CATHETER INSERTION N/A 02/11/2018   Procedure: DIALYSIS/PERMA CATHETER INSERTION;  Surgeon: Algernon Huxley, MD;  Location: [place] CV LAB;  Service: Cardiovascular;  Laterality: N/A;  . DIALYSIS/PERMA CATHETER REMOVAL Right 02/04/2018   Procedure: DIALYSIS/PERMA CATHETER REMOVAL;  Surgeon: Katha Cabal, MD;  Location: [place] [facility] LAB;  Service: Cardiovascular;  Laterality: Right;  .  ESOPHAGOGASTRODUODENOSCOPY (EGD) WITH PROPOFOL N/A 10/24/2016   Procedure: ESOPHAGOGASTRODUODENOSCOPY (EGD) WITH PROPOFOL;  Surgeon: [name], MD;  Location: [facility];  Service: Endoscopy;  Laterality: N/A;  . EYE SURGERY Bilateral    cataract surgery  . EYE SURGERY     drains for glaucoma  . INSERTION EXPRESS TUBE SHUNT Right 08/01/2016   Procedure: INSERTION EXPRESS TUBE SHUNT;  Surgeon: Eulogio Bear, MD;  Location: [facility];  Service: Ophthalmology;  Laterality: Right;  . INSERTION OF AHMED VALVE Left 08/15/2016   Procedure: INSERTION OF AHMED VALVE;  Surgeon: Eulogio Bear, MD;  Location: [facility];  Service: Ophthalmology;  Laterality: Left;  . INSERTION OF DIALYSIS CATHETER    . LOWER EXTREMITY ANGIOGRAPHY Right 02/19/2018   Procedure: Lower Extremity Angiography;  Surgeon: Algernon Huxley, MD;  Location: [place] [facility] LAB;  Service: Cardiovascular;  Laterality: Right;  . LOWER EXTREMITY ANGIOGRAPHY Left 01/30/2018   Procedure: Lower Extremity Angiography;  Surgeon: Katha Cabal, MD;  Location: [place] [facility];  Service: Cardiovascular;  Laterality: Left;  . REMOVAL OF A DIALYSIS CATHETER N/A 02/25/2018  Procedure: REMOVAL OF A DIALYSIS CATHETER;  Surgeon: Katha Cabal, MD;  Location: [facility];  Service: Vascular;  Laterality: N/A;  . TEMPORARY DIALYSIS CATHETER N/A 02/06/2018   Procedure: TEMPORARY DIALYSIS CATHETER;  Surgeon: Katha Cabal, MD;  Location: [place] [facility];  Service: Cardiovascular;  Laterality: N/A;  . TRANSTHORACIC ECHOCARDIOGRAM  January 2017    EF 60-65%. GR 2 DD. Mild degenerative mitral valve disease but no prolapse or regurgitation. Mild left atrial dilation. Mild to moderate LVH. Pericardial effusion gone  . UPPER EXTREMITY ANGIOGRAPHY Left 06/27/2016   Procedure: Upper Extremity Angiography;  Surgeon: Algernon Huxley, MD;  Location: [place] [facility] LAB;  Service: Cardiovascular;  Laterality: Left;  . UPPER EXTREMITY  ANGIOGRAPHY Left 08/22/2016   Procedure: Upper Extremity Angiography;  Surgeon: Algernon Huxley, MD;  Location: [place] [facility];  Service: Cardiovascular;  Laterality: Left;  Marland Kitchen VASCULAR SURGERY    . WOUND DEBRIDEMENT Left 05/08/2018   Procedure: DEBRIDEMENT OF LEFT LEG BKA;  Surgeon: Algernon Huxley, MD;  Location: [facility];  Service: General;  Laterality: Left;    (Not in a hospital admission)  Social History   Socioeconomic History  . Marital status: Married    Spouse name: Not on file  . Number of children: Not on file  . Years of education: Not on file  . Highest education level: Not on file  Occupational History  . Not on file  Tobacco Use  . Smoking status: Never Smoker  . Smokeless tobacco: Never Used  Vaping Use  . Vaping Use: Never used  Substance and Sexual Activity  . Alcohol use: No  . Drug use: No  . Sexual activity: Never  Other Topics Concern  . Not on file  Social History Narrative   Lives in [place] with family.   Social Determinants of Health   Financial Resource Strain: Not on file  Food Insecurity: Not on file  Transportation Needs: Not on file  Physical Activity: Not on file  Stress: Not on file  Social Connections: Not on file  Intimate Partner Violence: Not on file    Family History  Problem Relation Age of Onset  . Diabetes Mellitus II Mother   . Heart failure Mother   . Pancreatic cancer Father       Review of systems complete and found to be negative unless listed above      PHYSICAL EXAM  General: Well developed, well nourished, in no acute distress HEENT:  Normocephalic and atramatic Neck:  No JVD.  Lungs: Clear bilaterally to auscultation and percussion. Heart: HRRR . Normal S1 and S2 without gallops or 2/6 sem murmurs.  Abdomen: Bowel sounds are positive, abdomen soft and non-tender  Msk:  Back normal, normal gait. Normal strength and tone for age. Extremities: Bilateral BKA's Neuro: Alert and oriented X 3. Psych:  Good  affect, responds appropriately  Labs:   Lab Results  Component Value Date   WBC 5.5 05/20/2020   HGB 8.5 (L) 05/20/2020   HCT 27.8 (L) 05/20/2020   MCV 98.9 05/20/2020   PLT 138 (L) 05/20/2020    Recent Labs  Lab 05/15/20 0321 05/16/20 0605 05/20/20 0322  NA 140   < > 139  K 4.2   < > 4.5  CL 94*   < > 95*  CO2 30   < > 29  BUN 45*   < > 17  CREATININE 7.67*   < > 3.85*  CALCIUM 7.8*   < >  8.4*  PROT 6.5  --   --   BILITOT 0.7  --   --   ALKPHOS 69  --   --   ALT 14  --   --   AST 16  --   --   GLUCOSE 166*   < > 156*   < > = values in this interval not displayed.   Lab Results  Component Value Date   CKTOTAL 153 05/14/2020   TROPONINI 0.09 (HH) 07/10/2018    Lab Results  Component Value Date   CHOL 153 07/08/2018   Lab Results  Component Value Date   HDL 35 (L) 07/08/2018   Lab Results  Component Value Date   LDLCALC 55 07/08/2018   Lab Results  Component Value Date   TRIG 313 (H) 07/08/2018   Lab Results  Component Value Date   CHOLHDL 4.4 07/08/2018   No results found for: LDLDIRECT    Radiology: DG Chest Portable 1 View  Result Date: 05/20/2020 CLINICAL DATA:  Non STEMI, ventricular tachycardia. Blood pressure 72/44. on dialysis. EXAM: PORTABLE CHEST 1 VIEW COMPARISON:  Chest x-ray 05/13/2020 FINDINGS: Left chest wall dialysis catheter with tip overlying the right atrium. Cardiac paddles overlie the patient. Sternotomy wires and cardiac surgical changes again noted overlying the mediastinum. The heart size and mediastinal contours are unchanged. Persistent cardiomegaly. Markedly limited retrocardiac evaluation due to overlying paddles. Persistent increased interstitial markings. Persistent trace right pleural effusion and likely trace to small volume left pleural effusion. No pneumothorax. No acute osseous findings. IMPRESSION: 1. Cardiomegaly with persistent pulmonary edema and bilateral trace to small pleural effusions. Overlying  infection/inflammation not excluded. 2. Markedly limited retrocardiac evaluation due to overlying paddles. Electronically Signed   By: Iven Finn M.D.   On: 05/20/2020 05:08   DG Chest Portable 1 View  Result Date: 05/13/2020 CLINICAL DATA:  70 year old female status post cardiac arrest. EXAM: PORTABLE CHEST 1 VIEW COMPARISON:  Chest radiograph dated 05/10/2020. FINDINGS: Dialysis catheter in similar position. There is cardiomegaly with vascular congestion and small bilateral pleural effusions. No pneumothorax. Median sternotomy wires and CABG vascular clips. Atherosclerotic calcification of the aorta. No acute osseous pathology. IMPRESSION: Cardiomegaly with vascular congestion and small bilateral pleural effusions. Electronically Signed   By: Anner Crete M.D.   On: 05/13/2020 19:12   DG Chest Port 1 View  Result Date: 05/10/2020 CLINICAL DATA:  Shortness of breath and cough. EXAM: PORTABLE CHEST 1 VIEW COMPARISON:  04/17/2020 FINDINGS: Stable positioning left jugular tunneled dialysis catheter. Lungs demonstrate increased airspace disease, right greater than left with component of small bilateral pleural effusions. Although this may represent primarily CHF/pulmonary edema, pneumonia cannot be excluded, especially in the right mid to lower lung. No pneumothorax. Stable heart size. IMPRESSION: Increased airspace disease, right greater than left, with component of small bilateral pleural effusions. Although this may represent primarily CHF/pulmonary edema, pneumonia cannot be excluded, especially in the right mid to lower lung. Electronically Signed   By: Aletta Edouard M.D.   On: 05/10/2020 09:42   ECHOCARDIOGRAM COMPLETE  Result Date: 05/15/2020    ECHOCARDIOGRAM REPORT   Patient Name:   LENELL MCCONNELL Date of Exam: 05/15/2020 Medical Rec #:  [id number]     Height:       52.0 in Accession #:    [id number]    Weight:       132.3 lb Date of Birth:  1951/03/10    BSA:          1.412 m  Patient Age:     77 years      BP:           119/68 mmHg Patient Gender: F             HR:           62 bpm. Exam Location:  [facility] Procedure: 2D Echo, Color Doppler, Cardiac Doppler and Strain Analysis Indications:     I46.9 Cardiac arrest  History:         Patient has prior history of Echocardiogram examinations.                  HFpEF, Prior CABG, PVD, ESRD; Risk Factors:Diabetes and HCL.  Sonographer:     Charmayne Sheer RDCS (AE) Referring Phys:  [id number] Victoriano Lain A THOMAS Diagnosing Phys: Neoma Laming MD  Sonographer Comments: Global longitudinal strain was attempted. IMPRESSIONS  1. Left ventricular ejection fraction, by estimation, is 30 to 35%. The left ventricle has severely decreased function. The left ventricle demonstrates global hypokinesis. The left ventricular internal cavity size was moderately dilated. There is severe  concentric left ventricular hypertrophy. Left ventricular diastolic parameters are consistent with Grade III diastolic dysfunction (restrictive).  2. Right ventricular systolic function is severely reduced. The right ventricular size is severely enlarged. Severely increased right ventricular wall thickness.  3. Left atrial size was severely dilated.  4. Right atrial size was severely dilated.  5. The pericardial effusion is circumferential.  6. The mitral valve is degenerative. Mild mitral valve regurgitation. Severe mitral annular calcification.  7. The tricuspid valve is degenerative.  8. The aortic valve is calcified. Aortic valve regurgitation is mild to moderate. Mild to moderate aortic valve sclerosis/calcification is present, without any evidence of aortic stenosis. FINDINGS  Left Ventricle: Left ventricular ejection fraction, by estimation, is 30 to 35%. The left ventricle has severely decreased function. The left ventricle demonstrates global hypokinesis. The left ventricular internal cavity size was moderately dilated. There is severe concentric left ventricular hypertrophy. Left ventricular  diastolic parameters are consistent with Grade III diastolic dysfunction (restrictive). Right Ventricle: The right ventricular size is severely enlarged. Severely increased right ventricular wall thickness. Right ventricular systolic function is severely reduced. Left Atrium: Left atrial size was severely dilated. Right Atrium: Right atrial size was severely dilated. Pericardium: Trivial pericardial effusion is present. The pericardial effusion is circumferential. Mitral Valve: The mitral valve is degenerative in appearance. Severe mitral annular calcification. Mild mitral valve regurgitation. MV peak gradient, 8.2 mmHg. The mean mitral valve gradient is 3.0 mmHg. Tricuspid Valve: The tricuspid valve is degenerative in appearance. Tricuspid valve regurgitation is mild. Aortic Valve: The aortic valve is calcified. Aortic valve regurgitation is mild to moderate. Mild to moderate aortic valve sclerosis/calcification is present, without any evidence of aortic stenosis. Aortic valve mean gradient measures 3.0 mmHg. Aortic valve peak gradient measures 6.7 mmHg. Aortic valve area, by VTI measures 1.72 cm. Pulmonic Valve: The pulmonic valve was not well visualized. Pulmonic valve regurgitation is not visualized. Aorta: The aortic root was not well visualized. IAS/Shunts: No atrial level shunt detected by color flow Doppler.  LEFT VENTRICLE PLAX 2D LVIDd:         4.09 cm     Diastology LVIDs:         3.42 cm     LV e' medial:    3.37 cm/s LV PW:         1.37 cm     LV E/e' medial:  45.1 LV IVS:  1.30 cm     LV e' lateral:   5.44 cm/s LVOT diam:     1.90 cm     LV E/e' lateral: 27.9 LV SV:         45 LV SV Index:   32 LVOT Area:     2.84 cm  LV Volumes (MOD) LV vol d, MOD A2C: 56.7 ml LV vol d, MOD A4C: 80.3 ml LV vol s, MOD A2C: 29.3 ml LV vol s, MOD A4C: 36.7 ml LV SV MOD A2C:     27.4 ml LV SV MOD A4C:     80.3 ml LV SV MOD BP:      35.9 ml RIGHT VENTRICLE RV Basal diam:  3.87 cm LEFT ATRIUM             Index        RIGHT ATRIUM           Index LA diam:        4.00 cm 2.83 cm/m  RA Area:     12.20 cm LA Vol (A2C):   54.3 ml 38.46 ml/m RA Volume:   27.20 ml  19.27 ml/m LA Vol (A4C):   28.8 ml 20.40 ml/m LA Biplane Vol: 43.2 ml 30.60 ml/m  AORTIC VALVE AV Area (Vmax):    1.66 cm AV Area (Vmean):   1.72 cm AV Area (VTI):     1.72 cm AV Vmax:           129.00 cm/s AV Vmean:          82.600 cm/s AV VTI:            0.262 m AV Peak Grad:      6.7 mmHg AV Mean Grad:      3.0 mmHg LVOT Vmax:         75.50 cm/s LVOT Vmean:        50.100 cm/s LVOT VTI:          0.159 m LVOT/AV VTI ratio: 0.61  AORTA Ao Root diam: 2.70 cm MITRAL VALVE                TRICUSPID VALVE MV Area (PHT): 3.26 cm     TR Peak grad:   37.2 mmHg MV Area VTI:   1.07 cm     TR Vmax:        305.00 cm/s MV Peak grad:  8.2 mmHg MV Mean grad:  3.0 mmHg     SHUNTS MV Vmax:       1.43 m/s     Systemic VTI:  0.16 m MV Vmean:      74.7 cm/s    Systemic Diam: 1.90 cm MV Decel Time: 233 msec MV E velocity: 152.00 cm/s MV A velocity: 130.00 cm/s MV E/A ratio:  1.17 Neoma Laming MD Electronically signed by Neoma Laming MD Signature Date/Time: 05/15/2020/12:30:54 PM    Final     EKG: Normal sinus rhythm nonspecific STTW changes right bundle branch block  ASSESSMENT AND PLAN:  Non-STEMI Known coronary disease History of CABG x 25 March 2015 LIMA LAD ,SVG to RCA, SVG to OM 3 Ischemic cardiomyopathy Severe peripheral vascular disease bilateral BKA's End-stage renal disease on dialysis Hypertension Diabetes Previous CVA Seizure disorder Atrial fibrillation . Plan Agree with admit to telemetry Follow-up EKGs and troponins Repeat echocardiogram for assessment left ventricular function post non-STEMI Anticoagulation for heparin for 48 to 72 hours Continue dialysis management for renal insufficiency Continue therapy for ischemic cardiomyopathy Unlikely to  pursue an invasive strategy we will try to treat medically patient is not had any chest pain Hold  Eliquis for now in favor of heparin Continue aspirin therapy Agree with statin therapy for lipid management with Lipitor Coreg aspirin Lipitor hydralazine imdur for non-STEMI cardiomyopathy Continue diabetes management and control Maintain aggressive medical therapy invasive strategy is not likely  Signed: Yolonda Kida MD 05/20/2020, 2:07 PM

## 2020-05-20 NOTE — Progress Notes (Signed)
[facility] for Heparin Infusion Indication: ACS/nSTEMI  Allergies  Allergen Reactions  . Chlorthalidone Anaphylaxis, Itching and Rash  . Dilaudid [Hydromorphone Hcl] Anaphylaxis    Hypersensitivity. 0.64m caused pt to become unresponsive  . Fentanyl Rash  . Midazolam Rash  . Morphine And Related Anaphylaxis    Pt unresponsive, apneic after 0.540mdilaudid   . Ace Inhibitors Other (See Comments)    Reaction:  Hyperkalemia, agitation   . Angiotensin Receptor Blockers Other (See Comments)    Reaction:  Hyperkalemia, agitation   . Metoclopramide Other (See Comments)  . Norvasc [Amlodipine] Itching and Rash  . Phenergan [Promethazine Hcl] Anxiety    "antsy, can't sit still"    Patient Measurements: Height: 4' 4"  (132.1 cm) Weight: 54.4 kg (120 lb) IBW/kg (Calculated) : 27.1 Heparin Dosing Weight: 40kg Vital Signs: BP: 174/66 (01/29 1330) Pulse Rate: 57 (01/29 1330)  Labs: Recent Labs    05/20/20 0322 05/20/20 0429 05/20/20 0915 05/20/20 1144 05/20/20 1329 05/20/20 1344  HGB 8.5*  --   --   --   --   --   HCT 27.8*  --   --   --   --   --   PLT 138*  --   --   --   --   --   APTT  --  32  --   --   --  38*  LABPROT  --  16.3*  --   --   --   --   INR  --  1.4*  --   --   --   --   HEPARINUNFRC  --  2.78*  --   --   --   --   CREATININE 3.85*  --   --   --   --   --   TROPONINIHS 598* 926* 1,697* 2,037* 2,084*  --     Estimated Creatinine Clearance: 8.3 mL/min (A) (by C-G formula based on SCr of 3.85 mg/dL (H)).   Medical History: Past Medical History:  Diagnosis Date  . (HFpEF) heart failure with preserved ejection fraction ([place]   a. 01/2018 Echo: EF 50-55%, no rwma, Gr1 DD, triv AI, mild MR. Midly dil LA/RV, mod reduced RV fxn. Irreg thickening of TV w/ mobile echodensity that appears to arise from valve.  . Anemia   . Anorexia   . Bacteremia due to Pseudomonas   . Carotid arterial disease ([facility]   a. 01/2017 Carotid  U/S: RICA <4<09LICA <4<23 . Marland KitchenHF (congestive heart failure) ([place]  . Complication of anesthesia    a. Pt reports h/o complication on 9 different occasions - ? hypotension/arrest.  . 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 [facility], p-mLAD 50-60% (Resting FFR 0.75), mRCA 80-90%, ~40 Ost OM & D1-->CABG; b. 01/2016 Staged PCI of LCX x 2 and RCA.  . Depression 05/20/2020  . ESRD (end stage renal disease) on dialysis ([place]   a. ESRD secondary to acute kidney failure s/p CABG-->PD  . Essential hypertension   . GERD (gastroesophageal reflux disease)   . Heart murmur   . Hypercholesterolemia   . Myocardial infarction ([facility]2016  . PVD (peripheral vascular disease) ([place]   a. 01/30/2018 PV Angio: Sev Left Popliteal dzs s/p PTA w/ 75m36mutonix DEB w/ mech aspiration of the L popliteal, L AT, and Left tibioperoneal trunck and peroneal artery.  . S/P CABG  x 3 03/24/2015   a.  [facility]: Dr. Marland Kitchen Haithcock: CABG x 3, LIMA to LAD, SVG to RCA, SVG to OM3, EVH  . Sinusitis 2019  . Type II diabetes mellitus with complication (HCC)    CAD    Medications:  Apixaban 2.5 mg BID  Assessment: Pt is 70 yo female ESRD on HD arriving via form local nsg home report BP of 70/44 following dialysis on 1/28.  Upon arrival SBP 137-196 and ED cardiac monitor indicating run of [facility].  Goal of Therapy:  APTT level 66-102 Heparin level 0.3-0.7 units/ml Monitor platelets by anticoagulation protocol: Yes   0129 1344 aPTT 38, subtherapeutic  Plan:  Heparin 1200 unit bolus followed by increase in heparin drip to 600 units/hr. Recheck aPTT 1/30 at 0000. CBC and HL with morning labs.   Dorena Bodo, PharmD 05/20/2020 2:39 PM

## 2020-05-20 NOTE — Progress Notes (Signed)
[place] for Heparin Infusion Indication: ACS/nSTEMI  Allergies  Allergen Reactions  . Chlorthalidone Anaphylaxis, Itching and Rash  . Dilaudid [Hydromorphone Hcl] Anaphylaxis    Hypersensitivity. 0.546m caused pt to become unresponsive  . Fentanyl Rash  . Midazolam Rash  . Morphine And Related Anaphylaxis    Pt unresponsive, apneic after 0.532mdilaudid   . Ace Inhibitors Other (See Comments)    Reaction:  Hyperkalemia, agitation   . Angiotensin Receptor Blockers Other (See Comments)    Reaction:  Hyperkalemia, agitation   . Metoclopramide Other (See Comments)  . Norvasc [Amlodipine] Itching and Rash  . Phenergan [Promethazine Hcl] Anxiety    "antsy, can't sit still"    Patient Measurements: Height: 4' 4"  (132.1 cm) Weight: 54.4 kg (120 lb) IBW/kg (Calculated) : 27.1 Heparin Dosing Weight: 40kg Vital Signs: Temp: 98.4 F (36.9 C) (01/29 0023) Temp Source: Oral (01/29 0023) BP: 187/67 (01/29 0355) Pulse Rate: 60 (01/29 0355)  Labs: Recent Labs    05/20/20 0322 05/20/20 0429  HGB 8.5*  --   HCT 27.8*  --   PLT 138*  --   CREATININE 3.85*  --   TROPONINIHS 598* 926*    Estimated Creatinine Clearance: 8.3 mL/min (A) (by C-G formula based on SCr of 3.85 mg/dL (H)).   Medical History: Past Medical History:  Diagnosis Date  . (HFpEF) heart failure with preserved ejection fraction ([place]   a. 01/2018 Echo: EF 50-55%, no rwma, Gr1 DD, triv AI, mild MR. Midly dil LA/RV, mod reduced RV fxn. Irreg thickening of TV w/ mobile echodensity that appears to arise from valve.  . Anemia   . Anorexia   . Bacteremia due to Pseudomonas   . Carotid arterial disease ([facility]   a. 01/2017 Carotid U/S: RICA <4<42LICA <4<87 . Marland KitchenHF (congestive heart failure) (HC[facility]  . Complication of anesthesia    a. Pt reports h/o complication on 9 different occasions - ? hypotension/arrest.  . 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 p-mLAD 50-60% (Resting FFR 0.75), mRCA 80-90%, ~40 Ost OM & D1-->CABG; b. 01/2016 Staged PCI of LCX x 2 and RCA.  . Marland KitchenSRD (end stage renal disease) on dialysis (HCC[place]   a. ESRD secondary to acute kidney failure s/p CABG-->PD  . Essential hypertension   . GERD (gastroesophageal reflux disease)   . Heart murmur   . Hypercholesterolemia   . Myocardial infarction ([facility]2016  . PVD (peripheral vascular disease) ([facility]   a. 01/30/2018 PV Angio: Sev Left Popliteal dzs s/p PTA w/ 46m23mutonix DEB w/ mech aspiration of the L popliteal, L AT, and Left tibioperoneal trunck and peroneal artery.  . 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  [facility]: Dr. BenMarland Kitchenithcock: CABG x 3, LIMA to LAD, SVG to RCA, SVG to OM3, EVH  . Sinusitis 2019  . Type II diabetes mellitus with complication (HCC)    CAD    Medications:  Apixaban 2.5 mg BID  Assessment: Pt is [age] female ESRD on HD arriving via form local nsg home report BP of 70/44 following dialysis on 1/28.  Upon arrival SBP 137-196 and ED cardiac monitor indicating [facility]:  APTT level 66-102 Heparin level 0.3-0.7 units/ml Monitor platelets by anticoagulation protocol: Yes   Plan:  Due to DOAC PTA, ordered STAT aPTT, INR, HL Ordered 2400 units bolus x 1  Start heparin infusion at 500 units/hr Will follow aPTT until aPTT and HL correlate, then HL Will cechk aPTT level 8 hours after drip started  Will recheck HL daily while on heparin Continue to monitor H&H and platelets   Renda Rolls, PharmD, [facility] 05/20/2020 5:24 AM

## 2020-05-20 NOTE — H&P (Addendum)
History and Physical    Stephanie Ellis WVP:[id number] DOB: April 10, 1951 DOA: 05/20/2020  Referring MD/NP/PA:   PCP: Pcp, No   Patient coming from:  The patient is coming from SNF.  At baseline, pt is dependent for most of ADL.        Chief Complaint: hypotension  HPI: Stephanie Ellis is a 70 y.o. female with medical history significant of PAD status post bilateral BKA, A. fib on apixaban, cardiac arrest/PEA arrest felt to be secondary to Dilaudid use, CAD, CABG, ESRD-HD, HTN, HLD, DM, stroke, PAD, depression, CHF with EF30-35%, anemia, seizure, who presents with hypotension.   Patient is from nursing home. She was brought to ED because she was found to have hypotension with blood pressure 72/44. Per EMS, her blood pressure was at 140/74.  At arrival to ED, her blood pressure is 142/56. Pt had had dialysis yesterday. Pt states that she had one episode of diarrhea yesterday after eating a taco.  Currently no nausea, vomiting, diarrhea or abdominal pain.  Patient denies chest pain, shortness breath, cough, fever or chills.  No symptoms of UTI. Patient says that she feels normal and is not sure why she was brought in to the hospital. Pt had one episode of nonsustained ventricular tachycardia in ED.   ED Course: pt was found to have trop 598-->926, WBC 5.5, INR 1.4, PTT 32, negative Covid PCR, potassium 4.5, bicarbonate 29, creatinine 3.85, BUN 17, temperature normal, heart rate 61, RR 16, oxygen saturation 94% on 2 L oxygen (patient is on 2 L oxygen chronically at baseline), chest x-ray showed cardiomegaly and pulmonary edema with trace amount of bilateral pleural effusion.  Patient is admitted to progressive bed as inpatient.  Dr. Clayborn Bigness of cardiology and Dr. Theador Hawthorne of nephrology were consulted.  Review of Systems:   General: no fevers, chills, no body weight gain, has fatigue HEENT: no blurry vision, hearing changes or sore throat Respiratory: no dyspnea, coughing, wheezing CV: no chest pain,  no palpitations GI: no nausea, vomiting, abdominal pain, had diarrhea, no constipation GU: no dysuria, burning on urination, increased urinary frequency, hematuria  Ext: no leg edema Neuro: no unilateral weakness, numbness, or tingling, no vision change or hearing loss Skin: no rash, no skin tear. MSK: No muscle spasm, no deformity, no limitation of range of movement in spin Heme: No easy bruising.  Travel history: No recent long distant travel.  Allergy:  Allergies  Allergen Reactions  . Chlorthalidone Anaphylaxis, Itching and Rash  . Dilaudid [Hydromorphone Hcl] Anaphylaxis    Hypersensitivity. 0.31m caused pt to become unresponsive  . Fentanyl Rash  . Midazolam Rash  . Morphine And Related Anaphylaxis    Pt unresponsive, apneic after 0.563mdilaudid   . Ace Inhibitors Other (See Comments)    Reaction:  Hyperkalemia, agitation   . Angiotensin Receptor Blockers Other (See Comments)    Reaction:  Hyperkalemia, agitation   . Metoclopramide Other (See Comments)  . Norvasc [Amlodipine] Itching and Rash  . Phenergan [Promethazine Hcl] Anxiety    "antsy, can't sit still"    Past Medical History:  Diagnosis Date  . (HFpEF) heart failure with preserved ejection fraction ([place]   a. 01/2018 Echo: EF 50-55%, no rwma, Gr1 DD, triv AI, mild MR. Midly dil LA/RV, mod reduced RV fxn. Irreg thickening of TV w/ mobile echodensity that appears to arise from valve.  . Anemia   . Anorexia   . Bacteremia due to Pseudomonas   . Carotid arterial disease ([place]  a. 01/2017 Carotid U/S: RICA <52, LICA <84.  Marland Kitchen CHF (congestive heart failure) ([place])   . Complication of anesthesia    a. Pt reports h/o complication on 9 different occasions - ? hypotension/arrest.  . 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 01/2015 Cath [facility]): 70% LM, p-mLAD 50-60% (Resting FFR 0.75), mRCA 80-90%, ~40 Ost OM & D1-->CABG; b. 01/2016 Staged PCI of LCX x 2 and RCA.  . Depression 05/20/2020   . ESRD (end stage renal disease) on dialysis ([place])    a. ESRD secondary to acute kidney failure s/p CABG-->PD  . Essential hypertension   . GERD (gastroesophageal reflux disease)   . Heart murmur   . Hypercholesterolemia   . Myocardial infarction ([place]) 2016  . PVD (peripheral vascular disease) ([place])    a. 01/30/2018 PV Angio: Sev Left Popliteal dzs s/p PTA w/ 43m lutonix DEB w/ mech aspiration of the L popliteal, L AT, and Left tibioperoneal trunck and peroneal artery.  . 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  [facility]: Dr. BMarland KitchenHaithcock: CABG x 3, LIMA to LAD, SVG to RCA, SVG to OM3, EVH  . Sinusitis 2019  . Type II diabetes mellitus with complication (HCC)    CAD    Past Surgical History:  Procedure Laterality Date  . ABDOMINAL HYSTERECTOMY    . AMPUTATION Right 02/25/2018   Procedure: AMPUTATION BELOW KNEE;  Surgeon: SKatha Cabal MD;  Location: [facility] ORS;  Service: Vascular;  Laterality: Right;  . AMPUTATION Left 03/11/2018   Procedure: AMPUTATION BELOW KNEE;  Surgeon: DAlgernon Huxley MD;  Location: [facility] OR[facility];  Service: Vascular;  Laterality: Left;  . AMPUTATION Bilateral 04/13/2018   Procedure: REVISION OF BILATERAL AMPUTATIONS BELOW KNEE WITH WOUND VAC APPLICATIONS;  Surgeon: EEvaristo Bury MD;  Location: [facility];  Service: Vascular;  Laterality: Bilateral;  . AMPUTATION TOE Left 02/06/2018   Procedure: AMPUTATION GREAT TOE;  Surgeon: FSamara Deist DPM;  Location: [facility];  Service: Podiatry;  Laterality: Left;  . APPLICATION OF WOUND VAC Left 04/13/2018   Procedure: APPLICATION OF WOUND VAC;  Surgeon: EEvaristo Bury MD;  Location: [facility];  Service: Vascular;  Laterality: Left;  . ARTERIOVENOUS GRAFT PLACEMENT  05/2016  . AV FISTULA PLACEMENT Left 05/30/2016   Procedure: ARTERIOVENOUS graft;  Surgeon: JAlgernon Huxley MD;  Location: [facility];  Service: Vascular;  Laterality: Left;  . CAPD INSERTION N/A 10/30/2017   Procedure: LAPAROSCOPIC INSERTION CONTINUOUS AMBULATORY  PERITONEAL DIALYSIS  (CAPD) CATHETER;  Surgeon: DAlgernon Huxley MD;  Location: [facility];  Service: Vascular;  Laterality: N/A;  . CARDIAC CATHETERIZATION  01/2015   [facility]: Ost LM 70%, p-m LAD 50-60% (Rest FFR + @ 0.75), mRCA 80-90%, ostD1 40%, pOM1 40%  . CATARACT EXTRACTION W/PHACO Left 01/18/2016   Procedure: CATARACT EXTRACTION PHACO AND INTRAOCULAR LENS PLACEMENT (IOC);  Surgeon: BEulogio Bear MD;  Location: [facility];  Service: Ophthalmology;  Laterality: Left;  [place]1.05 AP% 15.5 CDE 10.16 Fluid Pack Lot # [id number]  . CATARACT EXTRACTION W/PHACO Right 08/01/2016   Procedure: CATARACT EXTRACTION PHACO AND INTRAOCULAR LENS PLACEMENT (IOC);  Surgeon: BEulogio Bear MD;  Location: [facility];  Service: Ophthalmology;  Laterality: Right;  UK[place]01:00.6 AP% 11.4 CDE 6.93  LOT # [id number]  . COLONOSCOPY    . CORONARY ANGIOPLASTY     SENTS 02/12/16  . CORONARY ARTERY BYPASS GRAFT  03/28/15    [facility]: Dr. HWaldemar Dickens LIMA to LAD, SVG to RCA,  SVG to OM3, EVH  . DIALYSIS/PERMA CATHETER INSERTION N/A 02/11/2018   Procedure: DIALYSIS/PERMA CATHETER INSERTION;  Surgeon: Algernon Huxley, MD;  Location: [place] [facility];  Service: Cardiovascular;  Laterality: N/A;  . DIALYSIS/PERMA CATHETER REMOVAL Right 02/04/2018   Procedure: DIALYSIS/PERMA CATHETER REMOVAL;  Surgeon: Katha Cabal, MD;  Location: [place] [facility];  Service: Cardiovascular;  Laterality: Right;  . ESOPHAGOGASTRODUODENOSCOPY (EGD) WITH PROPOFOL N/A 10/24/2016   Procedure: ESOPHAGOGASTRODUODENOSCOPY (EGD) WITH PROPOFOL;  Surgeon: [name], MD;  Location: [facility];  Service: Endoscopy;  Laterality: N/A;  . EYE SURGERY Bilateral    cataract surgery  . EYE SURGERY     drains for glaucoma  . INSERTION EXPRESS TUBE SHUNT Right 08/01/2016   Procedure: INSERTION EXPRESS TUBE SHUNT;  Surgeon: Eulogio Bear, MD;  Location: [facility];  Service: Ophthalmology;  Laterality: Right;  . INSERTION OF AHMED VALVE Left 08/15/2016    Procedure: INSERTION OF AHMED VALVE;  Surgeon: Eulogio Bear, MD;  Location: [facility];  Service: Ophthalmology;  Laterality: Left;  . INSERTION OF DIALYSIS CATHETER    . LOWER EXTREMITY ANGIOGRAPHY Right 02/19/2018   Procedure: Lower Extremity Angiography;  Surgeon: Algernon Huxley, MD;  Location: [place] [facility];  Service: Cardiovascular;  Laterality: Right;  . LOWER EXTREMITY ANGIOGRAPHY Left 01/30/2018   Procedure: Lower Extremity Angiography;  Surgeon: Katha Cabal, MD;  Location: [place] [facility] LAB;  Service: Cardiovascular;  Laterality: Left;  . REMOVAL OF A DIALYSIS CATHETER N/A 02/25/2018   Procedure: REMOVAL OF A DIALYSIS CATHETER;  Surgeon: Katha Cabal, MD;  Location: [facility];  Service: Vascular;  Laterality: N/A;  . TEMPORARY DIALYSIS CATHETER N/A 02/06/2018   Procedure: TEMPORARY DIALYSIS CATHETER;  Surgeon: Katha Cabal, MD;  Location: [place] [facility];  Service: Cardiovascular;  Laterality: N/A;  . TRANSTHORACIC ECHOCARDIOGRAM  January 2017    EF 60-65%. GR 2 DD. Mild degenerative mitral valve disease but no prolapse or regurgitation. Mild left atrial dilation. Mild to moderate LVH. Pericardial effusion gone  . UPPER EXTREMITY ANGIOGRAPHY Left 06/27/2016   Procedure: Upper Extremity Angiography;  Surgeon: Algernon Huxley, MD;  Location: [place] [facility];  Service: Cardiovascular;  Laterality: Left;  . UPPER EXTREMITY ANGIOGRAPHY Left 08/22/2016   Procedure: Upper Extremity Angiography;  Surgeon: Algernon Huxley, MD;  Location: [place] [facility];  Service: Cardiovascular;  Laterality: Left;  Marland Kitchen VASCULAR SURGERY    . WOUND DEBRIDEMENT Left 05/08/2018   Procedure: DEBRIDEMENT OF LEFT LEG BKA;  Surgeon: Algernon Huxley, MD;  Location: [facility];  Service: General;  Laterality: Left;    Social History:  reports that she has never smoked. She has never used smokeless tobacco. She reports that she does not drink alcohol and does not use drugs.  Family History:   Family History  Problem Relation Age of Onset  . Diabetes Mellitus II Mother   . Heart failure Mother   . Pancreatic cancer Father      Prior to Admission medications   Medication Sig Start Date End Date Taking? Authorizing Provider  apixaban (ELIQUIS) 2.5 MG TABS tablet Take 1 tablet (2.5 mg total) by mouth 2 (two) times daily. 02/15/18   Vaughan Basta, MD  aspirin EC 81 MG tablet Take 1 tablet (81 mg total) by mouth daily. 10/19/16   Dustin Flock, MD  atorvastatin (LIPITOR) 80 MG tablet Take 80 mg by mouth at bedtime.  05/05/17   [provider]  brimonidine (ALPHAGAN) 0.2 %  ophthalmic solution Place 1 drop into both eyes 3 (three) times daily. 11/05/19   Ezekiel Slocumb, DO  carvedilol (COREG) 25 MG tablet Take 1 tablet (25 mg total) by mouth 2 (two) times daily. 07/13/18   Stark Jock Jude, MD  fluticasone (FLONASE) 50 MCG/ACT nasal spray Place 2 sprays into both nostrils daily.    [provider]  hydrALAZINE (APRESOLINE) 25 MG tablet Take 3 tablets (75 mg total) by mouth every 8 (eight) hours. 04/19/20   Shelly Coss, MD  insulin aspart (NOVOLOG) 100 UNIT/ML injection Inject 0-6 Units into the skin See admin instructions. Inject under the skin according to blood glucose reading:  <201: 0u 201- 250: 2u 251-300: 4u 301-350: 5u 351-400: 6u    [provider]  insulin lispro (HUMALOG) 100 UNIT/ML KwikPen Inject 2-6 Units into the skin 3 (three) times daily before meals.    [provider]  ipratropium (ATROVENT) 0.06 % nasal spray Place 2 sprays into both nostrils 2 (two) times daily. 09/05/19   Loletha Grayer, MD  isosorbide mononitrate (IMDUR) 60 MG 24 hr tablet Take 1 tablet (60 mg total) by mouth daily. 04/19/20   Shelly Coss, MD  levETIRAcetam (KEPPRA) 250 MG tablet Three times weekly after dialysis Patient taking differently: Take 250 mg by mouth every Monday, Wednesday, and Friday. 05/12/18   Loletha Grayer, MD  levETIRAcetam  (KEPPRA) 500 MG tablet Take 500 mg by mouth 2 (two) times daily. 02/24/20   [provider]  losartan (COZAAR) 50 MG tablet Take 1 tablet (50 mg total) by mouth daily. 04/19/20   Shelly Coss, MD  meclizine (ANTIVERT) 25 MG tablet Take 25 mg by mouth every 4 (four) hours as needed for dizziness.    [provider]  mirtazapine (REMERON) 15 MG tablet Take 7.5 mg by mouth daily.     [provider]  Omega-3 1000 MG CAPS Take 2 capsules by mouth daily.     [provider]  pantoprazole (PROTONIX) 40 MG tablet Take 1 tablet (40 mg total) by mouth daily. 06/04/19   [name], MD  Polyvinyl Alcohol-Povidone PF (REFRESH) 1.4-0.6 % SOLN Place 1 drop into both eyes 2 (two) times a day.     [provider]  rOPINIRole (REQUIP) 0.25 MG tablet Take 0.25 mg by mouth at bedtime.    [provider]  senna-docusate (SENOKOT-S) 8.6-50 MG tablet Take 1 tablet by mouth 2 (two) times daily. 04/19/20   Shelly Coss, MD  sevelamer carbonate (RENVELA) 800 MG tablet 2 Tablet(s) By Mouth 3 Times Daily    [provider]  thiamine 100 MG tablet Take 1 tablet (100 mg total) by mouth daily. 04/19/20   Shelly Coss, MD  timolol (TIMOPTIC) 0.5 % ophthalmic solution Place 1 drop into both eyes 3 (three) times daily. 03/18/18   Bettey Costa, MD  triamcinolone cream (KENALOG) 0.1 % Apply 1 application topically daily as needed (dry skin). 11/01/19   [provider]  vitamin B-12 (CYANOCOBALAMIN) 1000 MCG tablet Take 1 tablet (1,000 mcg total) by mouth daily. 02/15/18   Vaughan Basta, MD    Physical Exam: Vitals:   05/20/20 1100 05/20/20 1130 05/20/20 1200 05/20/20 1230  BP: (!) 172/65 (!) 173/67 (!) 174/66 (!) 176/64  Pulse: 63 (!) 57 61 (!) 59  Resp: 13 13 10 14   Temp:      TempSrc:      SpO2: 100% 100% 97% 100%  Weight:      Height:  General: Not in acute distress HEENT:       Eyes: PERRL, EOMI, no scleral icterus.        ENT: No discharge from the ears and nose, no pharynx injection, no tonsillar enlargement.        Neck: No JVD, no bruit, no mass felt. Heme: No neck lymph node enlargement. Cardiac: S1/S2, RRR, No gallops or rubs. Respiratory: No rales, wheezing, rhonchi or rubs. GI: Soft, nondistended, nontender, no rebound pain, no organomegaly, BS present. GU: No hematuria Ext: No pitting leg edema bilaterally. S/p of bilateral BKA Musculoskeletal: No joint deformities, No joint redness or warmth, no limitation of ROM in spin. Skin: No rashes.  Neuro: Alert, oriented X3, cranial nerves II-XII grossly intact, moves all extremities. Psych: Patient is not psychotic, no suicidal or hemocidal ideation.  Labs on Admission: I have personally reviewed following labs and imaging studies  CBC: Recent Labs  Lab 05/13/20 1803 05/14/20 0500 05/14/20 0858 05/15/20 0321 05/20/20 0322  WBC 4.6 3.7* 3.7* 3.5* 5.5  NEUTROABS 3.2  --   --  2.3  --   HGB 8.4* 8.0* 8.2* 8.2* 8.5*  HCT 28.5* 26.7* 27.0* 27.3* 27.8*  MCV 101.8* 100.0 100.4* 98.9 98.9  PLT 71* 71* 74* 73* 903*   Basic Metabolic Panel: Recent Labs  Lab 05/13/20 2232 05/13/20 2233 05/14/20 0500 05/14/20 0858 05/14/20 1541 05/14/20 2027 05/15/20 0321 05/16/20 0605 05/20/20 0322 05/20/20 0429  NA  --    < > 142 141  --   --  140 139 139  --   K  --    < > 5.8* 5.3* 4.0 4.0 4.2 3.6 4.5  --   CL  --    < > 97* 99  --   --  94* 98 95*  --   CO2  --    < > 27 26  --   --  30 27 29   --   GLUCOSE  --    < > 88 87  --   --  166* 135* 156*  --   BUN  --    < > 83* 82*  --   --  45* 24* 17  --   CREATININE  --    < > 11.27* 11.67*  --   --  7.67* 5.24* 3.85*  --   CALCIUM  --    < > 8.1* 7.9*  --   --  7.8* 7.8* 8.4*  --   MG 2.3  --   --   --   --   --  2.0  --   --  2.0  PHOS  --   --   --  5.9*  --   --  4.5  --   --   --    < > = values in this interval not displayed.   GFR: Estimated Creatinine Clearance: 8.3 mL/min (A) (by C-G formula  based on SCr of 3.85 mg/dL (H)). Liver Function Tests: Recent Labs  Lab 05/13/20 1803 05/13/20 2233 05/14/20 0858 05/15/20 0321  AST 24 20  --  16  ALT 19 17  --  14  ALKPHOS 75 71  --  69  BILITOT 0.8 0.7  --  0.7  PROT 6.8 6.6  --  6.5  ALBUMIN 3.2* 3.2* 3.0* 3.1*   No results for input(s): LIPASE, AMYLASE in the last 168 hours. No results for input(s): AMMONIA in the last 168 hours. Coagulation Profile: Recent Labs  Lab  05/20/20 0429  INR 1.4*   Cardiac Enzymes: Recent Labs  Lab 05/14/20 0110  CKTOTAL 153   BNP (last 3 results) No results for input(s): PROBNP in the last 8760 hours. HbA1C: No results for input(s): HGBA1C in the last 72 hours. CBG: Recent Labs  Lab 05/15/20 2011 05/16/20 0752 05/16/20 1133 05/20/20 0922 05/20/20 1143  GLUCAP 97 124* 128* 100* 111*   Lipid Profile: No results for input(s): CHOL, HDL, LDLCALC, TRIG, CHOLHDL, LDLDIRECT in the last 72 hours. Thyroid Function Tests: No results for input(s): TSH, T4TOTAL, FREET4, T3FREE, THYROIDAB in the last 72 hours. Anemia Panel: No results for input(s): VITAMINB12, FOLATE, FERRITIN, TIBC, IRON, RETICCTPCT in the last 72 hours. Urine analysis:    Component Value Date/Time   COLORURINE RED (A) 10/17/2016 1643   APPEARANCEUR TURBID (A) 10/17/2016 1643   LABSPEC 1.014 10/17/2016 1643   PHURINE  10/17/2016 1643    TEST NOT REPORTED DUE TO COLOR INTERFERENCE OF URINE PIGMENT   GLUCOSEU (A) 10/17/2016 1643    TEST NOT REPORTED DUE TO COLOR INTERFERENCE OF URINE PIGMENT   HGBUR (A) 10/17/2016 1643    TEST NOT REPORTED DUE TO COLOR INTERFERENCE OF URINE PIGMENT   BILIRUBINUR (A) 10/17/2016 1643    TEST NOT REPORTED DUE TO COLOR INTERFERENCE OF URINE PIGMENT   KETONESUR (A) 10/17/2016 1643    TEST NOT REPORTED DUE TO COLOR INTERFERENCE OF URINE PIGMENT   PROTEINUR (A) 10/17/2016 1643    TEST NOT REPORTED DUE TO COLOR INTERFERENCE OF URINE PIGMENT   NITRITE (A) 10/17/2016 1643    TEST NOT  REPORTED DUE TO COLOR INTERFERENCE OF URINE PIGMENT   LEUKOCYTESUR (A) 10/17/2016 1643    TEST NOT REPORTED DUE TO COLOR INTERFERENCE OF URINE PIGMENT   Sepsis Labs: @LABRCNTIP (procalcitonin:4,lacticidven:4) ) Recent Results (from the past 240 hour(s))  SARS Coronavirus 2 by RT PCR (hospital order, performed in [place] hospital lab) Nasopharyngeal Nasopharyngeal Swab     Status: None   Collection Time: 05/13/20  6:08 PM   Specimen: Nasopharyngeal Swab  Result Value Ref Range Status   SARS Coronavirus 2 NEGATIVE NEGATIVE Final    Comment: (NOTE) SARS-CoV-2 target nucleic acids are NOT DETECTED.  The SARS-CoV-2 RNA is generally detectable in upper and lower respiratory specimens during the acute phase of infection. The lowest concentration of SARS-CoV-2 viral copies this assay can detect is 250 copies / mL. A negative result does not preclude SARS-CoV-2 infection and should not be used as the sole basis for treatment or other patient management decisions.  A negative result may occur with improper specimen collection / handling, submission of specimen other than nasopharyngeal swab, presence of viral mutation(s) within the areas targeted by this assay, and inadequate number of viral copies (<250 copies / mL). A negative result must be combined with clinical observations, patient history, and epidemiological information.  Fact Sheet for Patients:   StrictlyIdeas.no  Fact Sheet for Healthcare Providers: BankingDealers.co.za  This test is not yet approved or  cleared by the [place] FDA and has been authorized for detection and/or diagnosis of SARS-CoV-2 by FDA under an Emergency Use Authorization (EUA).  This EUA will remain in effect (meaning this test can be used) for the duration of the COVID-19 declaration under Section 564(b)(1) of the Act, 21 U.S.C. section 360bbb-3(b)(1), unless the authorization is terminated  or revoked sooner.  Performed at [facility], [address] [place], [place] [address]   SARS Coronavirus 2 by RT PCR (hospital order,  performed in [facility] hospital lab) Nasopharyngeal Nasopharyngeal Swab     Status: None   Collection Time: 05/20/20  5:00 AM   Specimen: Nasopharyngeal Swab  Result Value Ref Range Status   SARS Coronavirus 2 NEGATIVE NEGATIVE Final    Comment: (NOTE) SARS-CoV-2 target nucleic acids are NOT DETECTED.  The SARS-CoV-2 RNA is generally detectable in upper and lower respiratory specimens during the acute phase of infection. The lowest concentration of SARS-CoV-2 viral copies this assay can detect is 250 copies / mL. A negative result does not preclude SARS-CoV-2 infection and should not be used as the sole basis for treatment or other patient management decisions.  A negative result may occur with improper specimen collection / handling, submission of specimen other than nasopharyngeal swab, presence of viral mutation(s) within the areas targeted by this assay, and inadequate number of viral copies (<250 copies / mL). A negative result must be combined with clinical observations, patient history, and epidemiological information.  Fact Sheet for Patients:   StrictlyIdeas.no  Fact Sheet for Healthcare Providers: BankingDealers.co.za  This test is not yet approved or  cleared by the [place] FDA and has been authorized for detection and/or diagnosis of SARS-CoV-2 by FDA under an Emergency Use Authorization (EUA).  This EUA will remain in effect (meaning this test can be used) for the duration of the COVID-19 declaration under Section 564(b)(1) of the Act, 21 U.S.C. section 360bbb-3(b)(1), unless the authorization is terminated or revoked sooner.  Performed at [facility], [address] [place], [place] [address]      Radiological Exams on Admission: DG Chest Portable  1 View  Result Date: 05/20/2020 CLINICAL DATA:  Non STEMI, ventricular tachycardia. Blood pressure 72/44. on dialysis. EXAM: PORTABLE CHEST 1 VIEW COMPARISON:  Chest x-ray 05/13/2020 FINDINGS: Left chest wall dialysis catheter with tip overlying the right atrium. Cardiac paddles overlie the patient. Sternotomy wires and cardiac surgical changes again noted overlying the mediastinum. The heart size and mediastinal contours are unchanged. Persistent cardiomegaly. Markedly limited retrocardiac evaluation due to overlying paddles. Persistent increased interstitial markings. Persistent trace right pleural effusion and likely trace to small volume left pleural effusion. No pneumothorax. No acute osseous findings. IMPRESSION: 1. Cardiomegaly with persistent pulmonary edema and bilateral trace to small pleural effusions. Overlying infection/inflammation not excluded. 2. Markedly limited retrocardiac evaluation due to overlying paddles. Electronically Signed   By: Iven Finn M.D.   On: 05/20/2020 05:08     EKG: I have personally reviewed.  Sinus rhythm, QTC 509, T wave inversion in lateral leads, bifascicular block, poor R wave progression  Assessment/Plan Principal Problem:   NSTEMI (non-ST elevated myocardial infarction) ([place]) Active Problems:   ESRD (end stage renal disease) on dialysis (HCC)   Hypertension   Hyperlipidemia   Anemia in ESRD (end-stage renal disease) (HCC)   Seizure (HCC)   Chronic combined systolic and diastolic CHF (congestive heart failure) (HCC)   Type II diabetes mellitus with renal manifestations (HCC)   Stroke (HCC)   CAD (coronary artery disease)   Depression   Prolonged QT interval   Hypotension   Nonsustained ventricular tachycardia (HCC)   Atrial fibrillation, chronic (HCC)   NSTEMI and hx of CAD: s/p of CABG. Patient does not have chest pain or shortness breath, but her troponin is trending up from 598 to 926. Dr. Clayborn Bigness of card is consulted.  - admit to  progressive unit as inpatient - IV heparin started in ED - Trend Trop - Repeat EKG in the  am  - aspirin, lipitor, Imdur - Risk factor stratification: will check FLP and A1C  - check UDS  ESRD (end stage renal disease) on dialysis (MWF): -Dr. Theador Hawthorne is consulted  Hypotension: pt was reportedly to have low Bp at 72/44. Per EMS, her blood pressure was at 140/74. Currently her Bp is 159/55 -->176/64. Not sure what happened.  May be due to fluid removal from dialysis. -monitoring Bp closely  Hypertension: -IV hydralazine as needed - hydralazine, Cozaar, Coreg  Hyperlipidemia -lipitor  Anemia in ESRD (end-stage renal disease) ([place]): Hgb stable. Hgb 8.2 on 05/15/20 -->8.5 today F/u with CBC  Seizure -Seizure precaution -When necessary Ativan for seizure -Continue Home medications: Keppra  Chronic combined systolic and diastolic CHF (congestive heart failure) ([place]): 2D echo 05/15/2020 showed EF of 30-35% with grade 3 diastolic dysfunction.  Patient does not have shortness of breath.  Oxygen saturation 94% at her 2 L baseline oxygen.  CHF seem to be compensated. -volume management per renal by dialysis  Type II diabetes mellitus with renal manifestations [facility]): Recent A1c 7.8, poorly controlled.  Patient is taking Humalog at home -SSI  Stroke [facility]) -ASA and lipitor  Depression -Hold Remeron and Requip due to QTC prolongation  Prolonged QT interval and nonsustained ventricular tachycardia: Patient had history of cardiac arrest.  He had one episode of V. tach in ED -Hold Requip and Remeron -check Mg level -pacer at bedside  A fib: HR 61 -switched Eliquis to IV heparin due NSTEMI -coreg       DVT ppx: SQ Heparin   Code Status: Partial code (I spoke with her two daughters by phone, per her two daughters, patient wants to be partial code, OK for CPR, but no intubation). Family Communication: I spoke with her two daughters by phone Disposition Plan:  Anticipate discharge  back to previous environment Consults called: Dr. Clayborn Bigness of cardiology, Dr. Theador Hawthorne of nephrology Admission status and Level of care: Progressive Cardiac as inpt     SDU/inpation          Status is: Inpatient  Remains inpatient appropriate because:Inpatient level of care appropriate due to severity of illness   Dispo: The patient is from: SNF              Anticipated d/c is to: SNF              Anticipated d/c date is: 2 days              Patient currently is not medically stable to d/c.   Difficult to place patient No          Date of Service 05/20/2020    [facility]   If 7PM-7AM, please contact night-coverage [email or web address] 05/20/2020, 1:46 PM

## 2020-05-20 NOTE — ED Notes (Signed)
Per pt and family request, chaplin contacted about making a POA. Mable Fill will see pt in ED or in admission room. Family updated

## 2020-05-20 NOTE — ED Notes (Signed)
This RN to bedside, CBG obtained and repeat blood work obtained by this Therapist, sports. Pt states "I've been alone in this room too long when can I have something to eat?" This RN explained after reviewing orders pt currently NPO, pt states understanding. Pt also requesting a bath, pt states "I've had female nurses today", this RN explained this RN covering for primary RN at this time. Pt states "when am I going upstairs?" This RN apologized and explained delay pt states "nobody don't know nothing".

## 2020-05-20 NOTE — ED Notes (Signed)
Date and time results received: 05/20/20  0417 (use smartphrase ".now" to insert current time)  Test: Troponin Critical Value: 598  Name of Provider Notified: Alfred Levins, MD

## 2020-05-20 NOTE — ED Notes (Addendum)
Pt arrived via EMS from [facility] where staff reported a BP of 70/44. Per EMS pts BP in route and on scene was 140/74. Pt sts "I dont know why they sent me here. I feel fine."   Pt is dialysis and was dialyzed 01/28.

## 2020-05-20 NOTE — Progress Notes (Signed)
Patient is requesting AD to be completed. There was no way to get witnesses for this to be facilitated. Patient will be sent to a unit once a bed is free. Requesting unit staff to put in a Order Requisition for our team to get this completed.

## 2020-05-20 NOTE — ED Notes (Signed)
Unable to collect UDS - per pt she does not make urine

## 2020-05-20 NOTE — ED Provider Notes (Signed)
[facility] Emergency Department Provider Note  ____________________________________________  Time seen: Approximately 4:32 AM  I have reviewed the triage vital signs and the nursing notes.   HISTORY  Chief Complaint Hypotension   HPI Stephanie Ellis is a 70 y.o. female with a history of ESRD on HD, congestive heart failure, anemia, CAD status post CABG, hypertension, hyperlipidemia, PVD, diabetes  who presents from her nursing home for hypotension.  Patient went to dialysis today and this evening was told by the nursing home staff that her pressure was very low and EMS had been called.  Per EMS patient's blood pressure at the nursing home was 70/44 but when they arrived her blood pressure was 140/74.  Patient had a full dialysis treatment today.  Does report having one episode of diarrhea earlier today after eating a taco.  She denies chest pain, shortness of breath that is changed from her baseline, cough, fever, body aches, abdominal pain, nausea, vomiting.  Patient says that she feels normal and is not sure why she was brought in to the hospital.  Past Medical History:  Diagnosis Date  . (HFpEF) heart failure with preserved ejection fraction ([place])    a. 01/2018 Echo: EF 50-55%, no rwma, Gr1 DD, triv AI, mild MR. Midly dil LA/RV, mod reduced RV fxn. Irreg thickening of TV w/ mobile echodensity that appears to arise from valve.  . Anemia   . Anorexia   . Bacteremia due to Pseudomonas   . Carotid arterial disease ([place])    a. 01/2017 Carotid U/S: RICA <97, LICA <67.  Marland Kitchen CHF (congestive heart failure) ([place])   . Complication of anesthesia    a. Pt reports h/o complication on 9 different occasions - ? hypotension/arrest.  . 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 01/2015 Cath [facility]): 70% LM, p-mLAD 50-60% (Resting FFR 0.75), mRCA 80-90%, ~40 Ost OM & D1-->CABG; b. 01/2016 Staged PCI of LCX x 2 and RCA.  Marland Kitchen ESRD (end stage renal  disease) on dialysis ([place])    a. ESRD secondary to acute kidney failure s/p CABG-->PD  . Essential hypertension   . GERD (gastroesophageal reflux disease)   . Heart murmur   . Hypercholesterolemia   . Myocardial infarction ([place]) 2016  . PVD (peripheral vascular disease) ([place])    a. 01/30/2018 PV Angio: Sev Left Popliteal dzs s/p PTA w/ 2m lutonix DEB w/ mech aspiration of the L popliteal, L AT, and Left tibioperoneal trunck and peroneal artery.  . 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  [facility]: Dr. BMarland KitchenHaithcock: CABG x 3, LIMA to LAD, SVG to RCA, SVG to OM3, EVH  . Sinusitis 2019  . Type II diabetes mellitus with complication ([facility]    CAD    Patient Active Problem List   Diagnosis Date Noted  . NSTEMI (non-ST elevated myocardial infarction) ([place] 05/20/2020  . Dialysis patient, noncompliant ([place] 05/15/2020  . Hyperkalemia 05/15/2020  . Cardiac arrest ([place] 05/13/2020  . AMS (altered mental status) 04/10/2020  . Chronic combined systolic and diastolic CHF (congestive heart failure) ([place] 04/02/2020  . Thrombocytopenia ([place] 04/02/2020  . Chronic respiratory failure with hypoxia ([place] 11/03/2019  . Nocturnal hypoxia   . Shortness of breath   . Abdominal swelling   . Acute on chronic systolic CHF (congestive heart failure) ([place]   . Acute respiratory failure with hypoxia ([place] 09/02/2019  . Acute on chronic combined systolic (congestive) and diastolic (congestive) heart failure ([place] 09/02/2019  .  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 Severe anemia 05/01/2019  . Resides in skilled nursing facility 12/29/2018  . CVA (cerebral vascular accident) ([place]) 07/08/2018  . Infection of amputation stump ([place]) 05/02/2018  . BKA stump complication ([place]) 69/79/4801  . Unilateral complete BKA, right, initial encounter ([place]) 03/30/2018  . Transient loss of consciousness 03/19/2018  . Transient alteration of awareness   . VT (ventricular tachycardia) ([place])   . Disorientation   . Hypertensive urgency  03/08/2018  . Status post below-knee amputation of both lower extremities ([place]) 03/02/2018  . Ischemia of extremity 02/18/2018  . CKD (chronic kidney disease) stage 5, GFR less than 15 ml/min (HCC) 02/16/2018  . Diabetic osteomyelitis ([place])   . Peripheral vascular disease ([place])   . Advanced care planning/counseling discussion   . Palliative care by specialist   . Goals of care, counseling/discussion   . Type 2 diabetes mellitus with hyperlipidemia ([place]) 01/30/2018  . Peripheral neuropathy 06/17/2017  . Seizure disorder ([place]) 02/06/2017  . Accelerated hypertension 02/06/2017  . Endophthalmitis, left eye 12/05/2016  . Vomiting 10/24/2016  . Hematuria 10/17/2016  . Anesthesia complication 65/53/7482  . Chest pain 09/04/2016  . Steal syndrome dialysis vascular access, initial encounter ([place]) 06/18/2016  . 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 Coronary artery disease involving coronary bypass graft of native heart with angina pectoris ([place]) 02/10/2016  . Hypertension 02/02/2016  . Hyperlipidemia 02/02/2016  . Coronary artery disease involving left main coronary artery 10/04/2015  . Abdominal pain of unknown etiology 10/04/2015  . ESRD (end stage renal disease) on dialysis ([place])   . Type II diabetes mellitus with complication ([place])   . Right sided abdominal pain   . Elevated troponin 10/03/2015  . 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 Acute on chronic combined systolic and diastolic CHF (congestive heart failure) ([place]) 04/19/2014  . Anemia 09/20/2013  . Routine health maintenance 01/29/2013    Past Surgical History:  Procedure Laterality Date  . ABDOMINAL HYSTERECTOMY    . AMPUTATION Right 02/25/2018   Procedure: AMPUTATION BELOW KNEE;  Surgeon: Katha Cabal, MD;  Location: [facility] ORS;  Service: Vascular;  Laterality: Right;  . AMPUTATION Left 03/11/2018   Procedure: AMPUTATION BELOW KNEE;  Surgeon: Algernon Huxley, MD;  Location: [facility] ORS;  Service: Vascular;   Laterality: Left;  . AMPUTATION Bilateral 04/13/2018   Procedure: REVISION OF BILATERAL AMPUTATIONS BELOW KNEE WITH WOUND VAC APPLICATIONS;  Surgeon: Evaristo Bury, MD;  Location: [facility] ORS;  Service: Vascular;  Laterality: Bilateral;  . AMPUTATION TOE Left 02/06/2018   Procedure: AMPUTATION GREAT TOE;  Surgeon: Samara Deist, DPM;  Location: [facility] ORS;  Service: Podiatry;  Laterality: Left;  . APPLICATION OF WOUND VAC Left 04/13/2018   Procedure: APPLICATION OF WOUND VAC;  Surgeon: Evaristo Bury, MD;  Location: [facility] ORS;  Service: Vascular;  Laterality: Left;  . ARTERIOVENOUS GRAFT PLACEMENT  05/2016  . AV FISTULA PLACEMENT Left 05/30/2016   Procedure: ARTERIOVENOUS graft;  Surgeon: Algernon Huxley, MD;  Location: [facility] ORS;  Service: Vascular;  Laterality: Left;  . CAPD INSERTION N/A 10/30/2017   Procedure: LAPAROSCOPIC INSERTION CONTINUOUS AMBULATORY PERITONEAL DIALYSIS  (CAPD) CATHETER;  Surgeon: Algernon Huxley, MD;  Location: [facility] ORS;  Service: Vascular;  Laterality: N/A;  . CARDIAC CATHETERIZATION  01/2015   [facility]: Ost LM 70%, p-m LAD 50-60% (Rest FFR + @ 0.75), mRCA 80-90%, ostD1 40%, pOM1 40%  . CATARACT EXTRACTION W/PHACO Left 01/18/2016   Procedure: CATARACT EXTRACTION PHACO AND INTRAOCULAR LENS PLACEMENT (IOC);  Surgeon: Eulogio Bear, MD;  Location: [facility] OR[facility];  Service: Ophthalmology;  Laterality: Left;  [place] 1.05 AP% 15.5 CDE 10.16 Fluid Pack Lot # [id number]  . CATARACT EXTRACTION W/PHACO Right 08/01/2016   Procedure: CATARACT EXTRACTION PHACO AND INTRAOCULAR LENS PLACEMENT (IOC);  Surgeon: Eulogio Bear, MD;  Location: [facility] ORS;  Service: Ophthalmology;  Laterality: Right;  [place] 01:00.6 AP% 11.4 CDE 6.93  LOT # [id number]  . COLONOSCOPY    . CORONARY ANGIOPLASTY     SENTS 02/12/16  . CORONARY ARTERY BYPASS GRAFT  03/28/15    [facility]: Dr. Waldemar Dickens: LIMA to LAD, SVG to RCA, SVG to OM3, EVH  . DIALYSIS/PERMA CATHETER INSERTION N/A 02/11/2018   Procedure: DIALYSIS/PERMA CATHETER  INSERTION;  Surgeon: Algernon Huxley, MD;  Location: [place] CV LAB;  Service: Cardiovascular;  Laterality: N/A;  . DIALYSIS/PERMA CATHETER REMOVAL Right 02/04/2018   Procedure: DIALYSIS/PERMA CATHETER REMOVAL;  Surgeon: Katha Cabal, MD;  Location: [place] CV LAB;  Service: Cardiovascular;  Laterality: Right;  . ESOPHAGOGASTRODUODENOSCOPY (EGD) WITH PROPOFOL N/A 10/24/2016   Procedure: ESOPHAGOGASTRODUODENOSCOPY (EGD) WITH PROPOFOL;  Surgeon: [name], MD;  Location: [facility] ENDOS[facility];  Service: Endoscopy;  Laterality: N/A;  . EYE SURGERY Bilateral    cataract surgery  . EYE SURGERY     drains for glaucoma  . INSERTION EXPRESS TUBE SHUNT Right 08/01/2016   Procedure: INSERTION EXPRESS TUBE SHUNT;  Surgeon: Eulogio Bear, MD;  Location: [facility];  Service: Ophthalmology;  Laterality: Right;  . INSERTION OF AHMED VALVE Left 08/15/2016   Procedure: INSERTION OF AHMED VALVE;  Surgeon: Eulogio Bear, MD;  Location: [facility];  Service: Ophthalmology;  Laterality: Left;  . INSERTION OF DIALYSIS CATHETER    . LOWER EXTREMITY ANGIOGRAPHY Right 02/19/2018   Procedure: Lower Extremity Angiography;  Surgeon: Algernon Huxley, MD;  Location: [place] CV LAB;  Service: Cardiovascular;  Laterality: Right;  . LOWER EXTREMITY ANGIOGRAPHY Left 01/30/2018   Procedure: Lower Extremity Angiography;  Surgeon: Katha Cabal, MD;  Location: [place] [facility];  Service: Cardiovascular;  Laterality: Left;  . REMOVAL OF A DIALYSIS CATHETER N/A 02/25/2018   Procedure: REMOVAL OF A DIALYSIS CATHETER;  Surgeon: Katha Cabal, MD;  Location: [facility];  Service: Vascular;  Laterality: N/A;  . TEMPORARY DIALYSIS CATHETER N/A 02/06/2018   Procedure: TEMPORARY DIALYSIS CATHETER;  Surgeon: Katha Cabal, MD;  Location: [place] CV LAB;  Service: Cardiovascular;  Laterality: N/A;  . TRANSTHORACIC ECHOCARDIOGRAM  January 2017    EF 60-65%. GR 2 DD. Mild degenerative mitral valve disease  but no prolapse or regurgitation. Mild left atrial dilation. Mild to moderate LVH. Pericardial effusion gone  . UPPER EXTREMITY ANGIOGRAPHY Left 06/27/2016   Procedure: Upper Extremity Angiography;  Surgeon: Algernon Huxley, MD;  Location: [place] [facility] LAB;  Service: Cardiovascular;  Laterality: Left;  . UPPER EXTREMITY ANGIOGRAPHY Left 08/22/2016   Procedure: Upper Extremity Angiography;  Surgeon: Algernon Huxley, MD;  Location: [place] [facility] LAB;  Service: Cardiovascular;  Laterality: Left;  Marland Kitchen VASCULAR SURGERY    . WOUND DEBRIDEMENT Left 05/08/2018   Procedure: DEBRIDEMENT OF LEFT LEG BKA;  Surgeon: Algernon Huxley, MD;  Location: [facility];  Service: General;  Laterality: Left;    Prior to Admission medications   Medication Sig Start Date End Date Taking? Authorizing Provider  apixaban (ELIQUIS) 2.5 MG TABS tablet Take 1 tablet (2.5 mg total) by mouth 2 (two) times daily. 02/15/18   Vaughan Basta, MD  aspirin EC 81 MG tablet Take 1 tablet (81 mg total) by mouth daily. 10/19/16   Dustin Flock, MD  atorvastatin (LIPITOR) 80 MG tablet Take 80 mg by mouth at bedtime.  05/05/17   [provider]  brimonidine (ALPHAGAN) 0.2 % ophthalmic solution Place 1 drop into both eyes 3 (three) times daily. 11/05/19   Ezekiel Slocumb, DO  carvedilol (COREG) 25 MG tablet Take 1 tablet (25 mg total) by mouth 2 (two) times daily. 07/13/18   Stark Jock Jude, MD  fluticasone (FLONASE) 50 MCG/ACT nasal spray Place 2 sprays into both nostrils daily.    [provider]  hydrALAZINE (APRESOLINE) 25 MG tablet Take 3 tablets (75 mg total) by mouth every 8 (eight) hours. 04/19/20   Shelly Coss, MD  insulin aspart (NOVOLOG) 100 UNIT/ML injection Inject 0-6 Units into the skin See admin instructions. Inject under the skin according to blood glucose reading:  <201: 0u 201- 250: 2u 251-300: 4u 301-350: 5u 351-400: 6u    [provider]  insulin lispro (HUMALOG) 100 UNIT/ML KwikPen Inject 2-6  Units into the skin 3 (three) times daily before meals.    [provider]  ipratropium (ATROVENT) 0.06 % nasal spray Place 2 sprays into both nostrils 2 (two) times daily. 09/05/19   Loletha Grayer, MD  isosorbide mononitrate (IMDUR) 60 MG 24 hr tablet Take 1 tablet (60 mg total) by mouth daily. 04/19/20   Shelly Coss, MD  levETIRAcetam (KEPPRA) 250 MG tablet Three times weekly after dialysis Patient taking differently: Take 250 mg by mouth every Monday, Wednesday, and Friday. 05/12/18   Loletha Grayer, MD  levETIRAcetam (KEPPRA) 500 MG tablet Take 500 mg by mouth 2 (two) times daily. 02/24/20   [provider]  losartan (COZAAR) 50 MG tablet Take 1 tablet (50 mg total) by mouth daily. 04/19/20   Shelly Coss, MD  meclizine (ANTIVERT) 25 MG tablet Take 25 mg by mouth every 4 (four) hours as needed for dizziness.    [provider]  mirtazapine (REMERON) 15 MG tablet Take 7.5 mg by mouth daily.     [provider]  Omega-3 1000 MG CAPS Take 2 capsules by mouth daily.     [provider]  pantoprazole (PROTONIX) 40 MG tablet Take 1 tablet (40 mg total) by mouth daily. 06/04/19   [name], MD  Polyvinyl Alcohol-Povidone PF (REFRESH) 1.4-0.6 % SOLN Place 1 drop into both eyes 2 (two) times a day.     [provider]  rOPINIRole (REQUIP) 0.25 MG tablet Take 0.25 mg by mouth at bedtime.    [provider]  senna-docusate (SENOKOT-S) 8.6-50 MG tablet Take 1 tablet by mouth 2 (two) times daily. 04/19/20   Shelly Coss, MD  sevelamer carbonate (RENVELA) 800 MG tablet 2 Tablet(s) By Mouth 3 Times Daily    [provider]  thiamine 100 MG tablet Take 1 tablet (100 mg total) by mouth daily. 04/19/20   Shelly Coss, MD  timolol (TIMOPTIC) 0.5 % ophthalmic solution Place 1 drop into both eyes 3 (three) times daily. 03/18/18   Bettey Costa, MD  triamcinolone cream (KENALOG) 0.1 % Apply 1 application topically daily as needed  (dry skin). 11/01/19   [provider]  vitamin B-12 (CYANOCOBALAMIN) 1000 MCG tablet Take 1 tablet (1,000 mcg total) by mouth daily. 02/15/18   Vaughan Basta, MD    Allergies Chlorthalidone, Dilaudid [hydromorphone hcl], Fentanyl, Midazolam, Morphine and related, Ace inhibitors, Angiotensin receptor blockers, Metoclopramide, Norvasc [amlodipine], and Phenergan [promethazine  hcl]  Family History  Problem Relation Age of Onset  . Diabetes Mellitus II Mother   . Heart failure Mother   . Pancreatic cancer Father     Social History Social History   Tobacco Use  . Smoking status: Never Smoker  . Smokeless tobacco: Never Used  Vaping Use  . Vaping Use: Never used  Substance Use Topics  . Alcohol use: No  . Drug use: No    Review of Systems  Constitutional: Negative for fever. Eyes: Negative for visual changes. ENT: Negative for sore throat. Neck: No neck pain  Cardiovascular: Negative for chest pain. Respiratory: Negative for shortness of breath. Gastrointestinal: Negative for abdominal pain, vomiting. + diarrhea. Genitourinary: Negative for dysuria. Musculoskeletal: Negative for back pain. Skin: Negative for rash. Neurological: Negative for headaches, weakness or numbness. Psych: No SI or HI  ____________________________________________   PHYSICAL EXAM:  VITAL SIGNS: ED Triage Vitals  Enc Vitals Group     BP 05/20/20 0023 (!) 137/46     Pulse Rate 05/20/20 0023 60     Resp 05/20/20 0023 16     Temp 05/20/20 0023 98.4 F (36.9 C)     Temp Source 05/20/20 0023 Oral     SpO2 05/20/20 0023 99 %     Weight 05/20/20 0044 120 lb (54.4 kg)     Height 05/20/20 0044 4' 4"  (1.321 m)     Head Circumference --      Peak Flow --      Pain Score 05/20/20 0044 0     Pain Loc --      Pain Edu? --      Excl. in [place]? --     Constitutional: Alert and oriented, in no apparent distress. HEENT:      Head: Normocephalic and atraumatic.         Eyes:  Conjunctivae are normal. Sclera is non-icteric.       Mouth/Throat: Mucous membranes are moist.       Neck: Supple with no signs of meningismus. Cardiovascular: Regular rate and rhythm. No murmurs, gallops, or rubs.  Respiratory: Normal respiratory effort. Lungs are clear to auscultation bilaterally. Gastrointestinal: Soft, non tender, and non distended. Musculoskeletal: B/l BKAs, no edema, cyanosis, or erythema of extremities. Neurologic: Normal speech and language. Face is symmetric. Moving all extremities. No gross focal neurologic deficits are appreciated. Skin: Skin is warm, dry and intact. No rash noted. Psychiatric: Mood and affect are normal. Speech and behavior are normal.  ____________________________________________   LABS (all labs ordered are listed, but only abnormal results are displayed)  Labs Reviewed  BASIC METABOLIC PANEL - Abnormal; Notable for the following components:      Result Value   Chloride 95 (*)    Glucose, Bld 156 (*)    Creatinine, Ser 3.85 (*)    Calcium 8.4 (*)    GFR, Estimated 12 (*)    All other components within normal limits  CBC - Abnormal; Notable for the following components:   RBC 2.81 (*)    Hemoglobin 8.5 (*)    HCT 27.8 (*)    RDW 17.7 (*)    Platelets 138 (*)    All other components within normal limits  PROTIME-INR - Abnormal; Notable for the following components:   Prothrombin Time 16.3 (*)    INR 1.4 (*)    All other components within normal limits  HEPARIN LEVEL (UNFRACTIONATED) - Abnormal; Notable for the following components:   Heparin Unfractionated 2.78 (*)  All other components within normal limits  TROPONIN I (HIGH SENSITIVITY) - Abnormal; Notable for the following components:   Troponin I (High Sensitivity) 598 (*)    All other components within normal limits  TROPONIN I (HIGH SENSITIVITY) - Abnormal; Notable for the following components:   Troponin I (High Sensitivity) 926 (*)    All other components within  normal limits  SARS CORONAVIRUS 2 BY RT PCR (HOSPITAL ORDER, [facility])  APTT  APTT  CBG MONITORING, ED   ____________________________________________  EKG  ED ECG REPORT I, Rudene Re, the attending physician, personally viewed and interpreted this ECG.  Normal sinus rhythm, rate of 62, left axis deviation, right bundle branch block, prolonged QTC.  No significant changes when compared to prior ____________________________________________  RADIOLOGY  none  ____________________________________________   PROCEDURES  Procedure(s) performed:yes .1-3 Lead EKG Interpretation Performed by: Rudene Re, MD Authorized by: Rudene Re, MD     Interpretation: abnormal     ECG rate assessment: normal     Rhythm: sinus rhythm     Conduction: abnormal     Critical Care performed: yes  CRITICAL CARE Performed by: Rudene Re  ?  Total critical care time: 40 min  Critical care time was exclusive of separately billable procedures and treating other patients.  Critical care was necessary to treat or prevent imminent or life-threatening deterioration.  Critical care was time spent personally by me on the following activities: development of treatment plan with patient and/or surrogate as well as nursing, discussions with consultants, evaluation of patient's response to treatment, examination of patient, obtaining history from patient or surrogate, ordering and performing treatments and interventions, ordering and review of laboratory studies, ordering and review of radiographic studies, pulse oximetry and re-evaluation of patient's condition.  ____________________________________________   INITIAL IMPRESSION / ASSESSMENT AND PLAN / ED COURSE  70 y.o. female with a history of ESRD on HD, congestive heart failure, anemia, CAD status post CABG, hypertension, hyperlipidemia, PVD, diabetes  who presents from her nursing home for  hypotension.  EMS's BP at the SNF was WNL. BP has been normal here as well. Patient denies any complaints. Reviewed of Epic shows that patient had 2 admissions to this hospital 1 in December 1 in January and in both of these admissions she underwent cardiac arrest which was attributed to a dose of IV narcotics leading to respiratory depression and cardiac arrest.  While on telemetry patient was noted to have 11 beat run of V. tach.  She is completely asymptomatic from it. Her initial troponin is significantly elevated at 598. Elevated troponin and Vtach concerning for cardiac etiology of patient's hypotensive episode at the SNF. Will start patient on heparin and admit to hospitalist. Electrolytes pending. Patient fully dialyzed today.   _________________________ 6:23 AM on 05/20/2020 -----------------------------------------  Repeat troponin trending up at 926.  Patient remains asymptomatic with no chest pain or shortness of breath.  Currently on heparin.      _____________________________________________ Please note:  Patient was evaluated in Emergency Department today for the symptoms described in the history of present illness. Patient was evaluated in the context of the global COVID-19 pandemic, which necessitated consideration that the patient might be at risk for infection with the SARS-CoV-2 virus that causes COVID-19. Institutional protocols and algorithms that pertain to the evaluation of patients at risk for COVID-19 are in a state of rapid change based on information released by regulatory bodies including the CDC and federal and state organizations. These  policies and algorithms were followed during the patient's care in the ED.  Some ED evaluations and interventions may be delayed as a result of limited staffing during the pandemic.   [place] Controlled Substance Database was reviewed by me. ____________________________________________   FINAL CLINICAL IMPRESSION(S) / ED  DIAGNOSES   Final diagnoses:  V-tach ([place])  NSTEMI (non-ST elevated myocardial infarction) ([place])      NEW MEDICATIONS STARTED DURING THIS VISIT:  ED Discharge Orders    None       Note:  This document was prepared using Dragon voice recognition software and may include unintentional dictation errors.    Alfred Levins, [place], MD 05/20/20 [phone]

## 2020-05-20 NOTE — Progress Notes (Signed)
Stephanie Ellis  MRN: [id number]  DOB/AGE: 12/22/1950 70 y.o.  Primary Care Physician:Pcp, No  Admit date: 05/20/2020  Chief Complaint:  Chief Complaint  Patient presents with  . Hypotension    S-Pt presented on  05/20/2020 with  Chief Complaint  Patient presents with  . Hypotension  . Stephanie Ellis is a 70 year old African-American female with a past medical history of P peripheral arterial disease, status post bilateral BKA, A. fib on apixaban, cardiac arrest/PEA arrest felt to be secondary to Dilaudid use, CAD, CABG, ESRD-HD, HTN, HLD, DM, stroke, PAD, depression, CHF with EF30-35%, anemia, seizure, who was sent from nursing home with a chief complaint of  hypotension.   History of present dates back to yesterday when the staff nursing home noticed the patient was hypotensive with  blood pressure 72/44.  EMS was called and as per  EMS, pt blood pressure was at 140/74.    Upon evaluation in the ER  blood pressure is 142/56.  Patient labs showed  trop 598-->926, chest x-ray showed cardiomegaly and pulmonary edema with trace amount of bilateral pleural effusion.  Patient is admitted  Nephrology was consulted Patient is well-known to nephrology as patient was recently here. Patient was seen today in the ER Patient offers no complaint of chest pain No complaint of shortness of breath no complaint of fever cough or chills No complaint of frequency urgency or dysuria  Medications   . aspirin EC  81 mg Oral Daily  . insulin aspart  0-5 Units Subcutaneous QHS  . insulin aspart  0-9 Units Subcutaneous TID WC         TMY:TRZNB from the symptoms mentioned above,there are no other symptoms referable to all systems reviewed.  Physical Exam: Vital signs in last 24 hours: Temp:  [98.4 F (36.9 C)] 98.4 F (36.9 C) (01/29 0023) Pulse Rate:  [57-63] 61 (01/29 1030) Resp:  [4-18] 16 (01/29 1030) BP: (137-196)/(46-91) 183/67 (01/29 1030) SpO2:  [86 %-100 %] 97 % (01/29  1030) Weight:  [54.4 kg] 54.4 kg (01/29 0044) Weight change:     Intake/Output from previous day: No intake/output data recorded. No intake/output data recorded.   Physical Exam:  General- pt is awake,alert, oriented to time place and person  Resp- No acute REsp distress, CTA B/L NO Rhonchi  CVS- S1S2 regular in rate and rhythm  GIT- BS+, soft, Non tender , Non distended  EXT- No LE Edema,  No Cyanosis  Access- tunneled cath   Lab Results:  CBC  Recent Labs    05/20/20 0322  WBC 5.5  HGB 8.5*  HCT 27.8*  PLT 138*    BMET  Recent Labs    05/20/20 0322  NA 139  K 4.5  CL 95*  CO2 29  GLUCOSE 156*  BUN 17  CREATININE 3.85*  CALCIUM 8.4*      Most recent Creatinine trend  Lab Results  Component Value Date   CREATININE 3.85 (H) 05/20/2020   CREATININE 5.24 (H) 05/16/2020   CREATININE 7.67 (H) 05/15/2020      MICRO   Recent Results (from the past 240 hour(s))  SARS Coronavirus 2 by RT PCR (hospital order, performed in [facility] hospital lab) Nasopharyngeal Nasopharyngeal Swab     Status: None   Collection Time: 05/13/20  6:08 PM   Specimen: Nasopharyngeal Swab  Result Value Ref Range Status   SARS Coronavirus 2 NEGATIVE NEGATIVE Final    Comment: (NOTE) SARS-CoV-2 target nucleic acids are NOT DETECTED.  The SARS-CoV-2  RNA is generally detectable in upper and lower respiratory specimens during the acute phase of infection. The lowest concentration of SARS-CoV-2 viral copies this assay can detect is 250 copies / mL. A negative result does not preclude SARS-CoV-2 infection and should not be used as the sole basis for treatment or other patient management decisions.  A negative result may occur with improper specimen collection / handling, submission of specimen other than nasopharyngeal swab, presence of viral mutation(s) within the areas targeted by this assay, and inadequate number of viral copies (<250 copies / mL). A negative result  must be combined with clinical observations, patient history, and epidemiological information.  Fact Sheet for Patients:   StrictlyIdeas.no  Fact Sheet for Healthcare Providers: BankingDealers.co.za  This test is not yet approved or  cleared by the [place] FDA and has been authorized for detection and/or diagnosis of SARS-CoV-2 by FDA under an Emergency Use Authorization (EUA).  This EUA will remain in effect (meaning this test can be used) for the duration of the COVID-19 declaration under Section 564(b)(1) of the Act, 21 U.S.C. section 360bbb-3(b)(1), unless the authorization is terminated or revoked sooner.  Performed at [facility], [address] [place], [place] [address]   SARS Coronavirus 2 by RT PCR (hospital order, performed in [facility] hospital lab) Nasopharyngeal Nasopharyngeal Swab     Status: None   Collection Time: 05/20/20  5:00 AM   Specimen: Nasopharyngeal Swab  Result Value Ref Range Status   SARS Coronavirus 2 NEGATIVE NEGATIVE Final    Comment: (NOTE) SARS-CoV-2 target nucleic acids are NOT DETECTED.  The SARS-CoV-2 RNA is generally detectable in upper and lower respiratory specimens during the acute phase of infection. The lowest concentration of SARS-CoV-2 viral copies this assay can detect is 250 copies / mL. A negative result does not preclude SARS-CoV-2 infection and should not be used as the sole basis for treatment or other patient management decisions.  A negative result may occur with improper specimen collection / handling, submission of specimen other than nasopharyngeal swab, presence of viral mutation(s) within the areas targeted by this assay, and inadequate number of viral copies (<250 copies / mL). A negative result must be combined with clinical observations, patient history, and epidemiological information.  Fact Sheet for Patients:    StrictlyIdeas.no  Fact Sheet for Healthcare Providers: BankingDealers.co.za  This test is not yet approved or  cleared by the [place] FDA and has been authorized for detection and/or diagnosis of SARS-CoV-2 by FDA under an Emergency Use Authorization (EUA).  This EUA will remain in effect (meaning this test can be used) for the duration of the COVID-19 declaration under Section 564(b)(1) of the Act, 21 U.S.C. section 360bbb-3(b)(1), unless the authorization is terminated or revoked sooner.  Performed at [facility], [address], [place], [place] [address]          Impression:   Stephanie Ellis is a 54 y.o. black female with end stage renal disease on hemodialysis, hypertension, diabetes mellitus type II, coronary artery disease status post CABG, peripheral vascular disease status post bilateral lower extremity amputations, GERD, congestive heart failure, hyperlipidemia, who was admitted to [facility] on 05/13/2020 for Cardiac arrest and now admitted on January 29 with chief complaint of hypotension  [facility] Nephrology MWF [facility] [address] RIJ permcath 52kg  1)Renal    End-stage renal disease Patient is a hemodialysis Patient is a Monday Wednesday Friday schedule Patient was last dialyzed yesterday No need for renal placement  therapy today   2) HTN Patient was sent to the ER with hypotension but patient blood pressure is on the higher side and stable     3)Anemia of chronic disease  CBC Latest Ref Rng & Units 05/20/2020 05/15/2020 05/14/2020  WBC 4.0 - 10.5 K/uL 5.5 3.5(L) 3.7(L)  Hemoglobin 12.0 - 15.0 g/dL 8.5(L) 8.2(L) 8.2(L)  Hematocrit 36.0 - 46.0 % 27.8(L) 27.3(L) 27.0(L)  Platelets 150 - 400 K/uL 138(L) 73(L) 74(L)       HGb is not at goal (9--11) We will keep patient on Epogen  4) Secondary hyperparathyroidism -CKD Mineral-Bone Disorder    Lab Results  Component Value Date   PTH 385 (H)  09/03/2019   CALCIUM 8.4 (L) 05/20/2020   PHOS 4.5 05/15/2020    Secondary Hyperparathyroidism present Phosphorus at goal.   5)NSTEMI Patient is on IV heparin Patient has been closely followed by the cardiology and the primary team  6) Electrolytes   BMP Latest Ref Rng & Units 05/20/2020 05/16/2020 05/15/2020  Glucose 70 - 99 mg/dL 156(H) 135(H) 166(H)  BUN 8 - 23 mg/dL 17 24(H) 45(H)  Creatinine 0.44 - 1.00 mg/dL 3.85(H) 5.24(H) 7.67(H)  Sodium 135 - 145 mmol/L 139 139 140  Potassium 3.5 - 5.1 mmol/L 4.5 3.6 4.2  Chloride 98 - 111 mmol/L 95(L) 98 94(L)  CO2 22 - 32 mmol/L 29 27 30   Calcium 8.9 - 10.3 mg/dL 8.4(L) 7.8(L) 7.8(L)     Sodium Normonatremic   Potassium Normokalemic    7)Acid base    Co2 at goal     Plan:   No need for renal replacement therapy today     Manpreet s Theador Hawthorne 05/20/2020, 10:33 AM

## 2020-05-20 NOTE — ED Notes (Signed)
Pt with o2 sats 87%. Pt found to have pulled off nasal canula and was on RA. 2LNC placed on pt. sats now 100%.

## 2020-05-20 NOTE — ED Notes (Signed)
Patient to waiting room via wheelchair by EMS from local nsg home.  Per EMS patient had dialysis and was fine afterward, NSG facility states patient with blood pressure 70/44, EMS bp 140/74, 98% on O2 at 2 liters (chronically).

## 2020-05-21 ENCOUNTER — Encounter: Payer: Self-pay | Admitting: Internal Medicine

## 2020-05-21 DIAGNOSIS — I214 Non-ST elevation (NSTEMI) myocardial infarction: Secondary | ICD-10-CM | POA: Diagnosis not present

## 2020-05-21 DIAGNOSIS — I482 Chronic atrial fibrillation, unspecified: Secondary | ICD-10-CM | POA: Diagnosis not present

## 2020-05-21 DIAGNOSIS — D631 Anemia in chronic kidney disease: Secondary | ICD-10-CM | POA: Diagnosis not present

## 2020-05-21 DIAGNOSIS — N186 End stage renal disease: Secondary | ICD-10-CM | POA: Diagnosis not present

## 2020-05-21 LAB — APTT
aPTT: 46 seconds — ABNORMAL HIGH (ref 24–36)
aPTT: 42 seconds — ABNORMAL HIGH (ref 24–36)

## 2020-05-21 LAB — LIPID PANEL
Cholesterol: 73 mg/dL (ref 0–200)
HDL: 37 mg/dL — ABNORMAL LOW (ref >40)
LDL Cholesterol: 24 mg/dL (ref 0–99)
Total CHOL/HDL Ratio: 2 RATIO
Triglycerides: 61 mg/dL (ref <150)
VLDL: 12 mg/dL (ref 0–40)

## 2020-05-21 LAB — CBC
HCT: 28.2 % — ABNORMAL LOW (ref 36.0–46.0)
Hemoglobin: 8.7 g/dL — ABNORMAL LOW (ref 12.0–15.0)
MCH: 30.5 pg (ref 26.0–34.0)
MCHC: 30.9 g/dL (ref 30.0–36.0)
MCV: 98.9 fL (ref 80.0–100.0)
Platelets: 148 10*3/uL — ABNORMAL LOW (ref 150–400)
RBC: 2.85 MIL/uL — ABNORMAL LOW (ref 3.87–5.11)
RDW: 17.8 % — ABNORMAL HIGH (ref 11.5–15.5)
WBC: 5.8 10*3/uL (ref 4.0–10.5)
nRBC: 0 % (ref 0.0–0.2)

## 2020-05-21 LAB — BASIC METABOLIC PANEL
Anion gap: 11 (ref 5–15)
BUN: 23 mg/dL (ref 8–23)
CO2: 28 mmol/L (ref 22–32)
Calcium: 8 mg/dL — ABNORMAL LOW (ref 8.9–10.3)
Chloride: 99 mmol/L (ref 98–111)
Creatinine, Ser: 5.81 mg/dL — ABNORMAL HIGH (ref 0.44–1.00)
GFR, Estimated: 7 mL/min — ABNORMAL LOW (ref >60)
Glucose, Bld: 187 mg/dL — ABNORMAL HIGH (ref 70–99)
Potassium: 3.1 mmol/L — ABNORMAL LOW (ref 3.5–5.1)
Sodium: 138 mmol/L (ref 135–145)

## 2020-05-21 LAB — GLUCOSE, CAPILLARY
Glucose-Capillary: 216 mg/dL — ABNORMAL HIGH (ref 70–99)
Glucose-Capillary: 113 mg/dL — ABNORMAL HIGH (ref 70–99)
Glucose-Capillary: 115 mg/dL — ABNORMAL HIGH (ref 70–99)
Glucose-Capillary: 184 mg/dL — ABNORMAL HIGH (ref 70–99)

## 2020-05-21 LAB — HEPARIN LEVEL (UNFRACTIONATED): Heparin Unfractionated: 0.9 IU/mL — ABNORMAL HIGH (ref 0.30–0.70)

## 2020-05-21 MED ORDER — HEPARIN BOLUS VIA INFUSION
1200.0000 [IU] | Freq: Once | INTRAVENOUS | Status: AC
  Administered 2020-05-21: 1200 [IU] via INTRAVENOUS
  Filled 2020-05-21: qty 1200

## 2020-05-21 MED ORDER — CHLORHEXIDINE GLUCONATE CLOTH 2 % EX PADS
6.0000 | MEDICATED_PAD | Freq: Every day | CUTANEOUS | Status: DC
  Administered 2020-05-23: 6 via TOPICAL

## 2020-05-21 MED ORDER — MENTHOL 3 MG MT LOZG
1.0000 | LOZENGE | OROMUCOSAL | Status: DC | PRN
  Filled 2020-05-21: qty 9

## 2020-05-21 MED ORDER — POTASSIUM CHLORIDE 20 MEQ PO PACK
40.0000 meq | PACK | Freq: Once | ORAL | Status: AC
  Administered 2020-05-21: 40 meq via ORAL
  Filled 2020-05-21: qty 2

## 2020-05-21 MED ORDER — EPOETIN ALFA 10000 UNIT/ML IJ SOLN
6000.0000 [IU] | INTRAMUSCULAR | Status: DC
  Administered 2020-05-22: 6000 [IU] via SUBCUTANEOUS

## 2020-05-21 MED ORDER — HEPARIN (PORCINE) 25000 UT/250ML-% IV SOLN
800.0000 [IU]/h | INTRAVENOUS | Status: DC
  Administered 2020-05-21 (×2): 700 [IU]/h via INTRAVENOUS
  Filled 2020-05-21: qty 250

## 2020-05-21 MED ORDER — GUAIFENESIN-DM 100-10 MG/5ML PO SYRP
15.0000 mL | ORAL_SOLUTION | ORAL | Status: DC | PRN
  Administered 2020-05-21 – 2020-05-22 (×3): 15 mL via ORAL
  Filled 2020-05-21 (×3): qty 15

## 2020-05-21 MED ORDER — EPOETIN ALFA 10000 UNIT/ML IJ SOLN: 10000.0000 [IU] | INTRAMUSCULAR | Status: DC

## 2020-05-21 NOTE — Progress Notes (Signed)
   05/21/20 1302  Clinical Encounter Type  Visited With Patient  Visit Type Follow-up;Spiritual support  Referral From Nurse  Consult/Referral To [place] did a follow visit with PT interference to an AD. I asked if she had requested an AD and I explained what it was for. I also asked Pt if she would like for me to go over the AD with her.  Pt stated, "no not right now and she will wait until her daughter is with her.  I advised Pt to give the Chaplains a call when her daughter is present and want to proceed with the AD. I ended my visited with a word of prayer.

## 2020-05-21 NOTE — Progress Notes (Signed)
Stephanie Ellis  MRN: [id number]  DOB/AGE: 1951-04-20 70 y.o.  Primary Care Physician:Pcp, No  Admit date: 05/20/2020  Chief Complaint:  Chief Complaint  Patient presents with  . Hypotension    S-Pt presented on  05/20/2020 with  Chief Complaint  Patient presents with  . Hypotension  . Stephanie Ellis is a 70 year old African-American female with a past medical history of P peripheral arterial disease, status post bilateral BKA, A. fib on apixaban, cardiac arrest/PEA arrest felt to be secondary to Dilaudid use, CAD, CABG, ESRD-HD, HTN, HLD, DM, stroke, PAD, depression, CHF with EF30-35%, anemia, seizure, who was sent from nursing home with a chief complaint of  hypotension.   Patient resting comfortably  Patient voices no new concerns  Medications   . aspirin EC  81 mg Oral Daily  . atorvastatin  80 mg Oral QHS  . brimonidine  1 drop Both Eyes TID  . carvedilol  25 mg Oral BID WC  . [START ON 05/22/2020] Chlorhexidine Gluconate Cloth  6 each Topical Q0600  . [START ON 05/22/2020] epoetin (EPOGEN/PROCRIT) injection  10,000 Units Subcutaneous Q M,W,F-HD  . fluticasone  2 spray Each Nare Daily  . hydrALAZINE  75 mg Oral Q8H  . insulin aspart  0-5 Units Subcutaneous QHS  . insulin aspart  0-9 Units Subcutaneous TID WC  . ipratropium  2 spray Each Nare BID  . isosorbide mononitrate  60 mg Oral Daily  . [START ON 05/22/2020] levETIRAcetam  250 mg Oral Q M,W,F  . levETIRAcetam  500 mg Oral BID  . losartan  50 mg Oral Daily  . omega-3 acid ethyl esters  2 g Oral Daily  . pantoprazole  40 mg Oral Daily  . polyvinyl alcohol  1 drop Both Eyes BID  . senna-docusate  1 tablet Oral BID  . sevelamer carbonate  1,600 mg Oral TID WC  . thiamine  100 mg Oral Daily  . timolol  1 drop Both Eyes TID  . vitamin B-12  1,000 mcg Oral Daily         HMC:NOBSJ from the symptoms mentioned above,there are no other symptoms referable to all systems reviewed.  Physical Exam: Vital signs in last  24 hours: Temp:  [97.9 F (36.6 C)-98.3 F (36.8 C)] 98.3 F (36.8 C) (01/30 0720) Pulse Rate:  [50-63] 60 (01/30 1145) Resp:  [6-22] 17 (01/30 0720) BP: (129-191)/(41-119) 131/49 (01/30 1145) SpO2:  [91 %-100 %] 99 % (01/30 1145) Weight:  [51.4 kg] 51.4 kg (01/30 0431) Weight change: -3.039 kg Last BM Date: 05/20/20  Intake/Output from previous day: 01/29 0701 - 01/30 0700 In: 172.7 [I.V.:172.7] Out: -  No intake/output data recorded.   Physical Exam:  General- pt is awake,alert, oriented to time place and person  Resp- No acute REsp distress, CTA B/L NO Rhonchi  CVS- S1S2 regular in rate and rhythm  GIT- BS+, soft, Non tender , Non distended  EXT- No LE Edema,  No Cyanosis  Access- tunneled cath   Lab Results:  CBC  Recent Labs    05/20/20 0322 05/21/20 0457  WBC 5.5 5.8  HGB 8.5* 8.7*  HCT 27.8* 28.2*  PLT 138* 148*    BMET  Recent Labs    05/20/20 0322 05/21/20 0457  NA 139 138  K 4.5 3.1*  CL 95* 99  CO2 29 28  GLUCOSE 156* 187*  BUN 17 23  CREATININE 3.85* 5.81*  CALCIUM 8.4* 8.0*      Most recent Creatinine trend  Lab  Results  Component Value Date   CREATININE 5.81 (H) 05/21/2020   CREATININE 3.85 (H) 05/20/2020   CREATININE 5.24 (H) 05/16/2020      MICRO   Recent Results (from the past 240 hour(s))  SARS Coronavirus 2 by RT PCR (hospital order, performed in [facility] hospital lab) Nasopharyngeal Nasopharyngeal Swab     Status: None   Collection Time: 05/13/20  6:08 PM   Specimen: Nasopharyngeal Swab  Result Value Ref Range Status   SARS Coronavirus 2 NEGATIVE NEGATIVE Final    Comment: (NOTE) SARS-CoV-2 target nucleic acids are NOT DETECTED.  The SARS-CoV-2 RNA is generally detectable in upper and lower respiratory specimens during the acute phase of infection. The lowest concentration of SARS-CoV-2 viral copies this assay can detect is 250 copies / mL. A negative result does not preclude SARS-CoV-2 infection and  should not be used as the sole basis for treatment or other patient management decisions.  A negative result may occur with improper specimen collection / handling, submission of specimen other than nasopharyngeal swab, presence of viral mutation(s) within the areas targeted by this assay, and inadequate number of viral copies (<250 copies / mL). A negative result must be combined with clinical observations, patient history, and epidemiological information.  Fact Sheet for Patients:   StrictlyIdeas.no  Fact Sheet for Healthcare Providers: BankingDealers.co.za  This test is not yet approved or  cleared by the [place] FDA and has been authorized for detection and/or diagnosis of SARS-CoV-2 by FDA under an Emergency Use Authorization (EUA).  This EUA will remain in effect (meaning this test can be used) for the duration of the COVID-19 declaration under Section 564(b)(1) of the Act, 21 U.S.C. section 360bbb-3(b)(1), unless the authorization is terminated or revoked sooner.  Performed at [facility], [address] [place], [place] [address]   SARS Coronavirus 2 by RT PCR (hospital order, performed in [facility] hospital lab) Nasopharyngeal Nasopharyngeal Swab     Status: None   Collection Time: 05/20/20  5:00 AM   Specimen: Nasopharyngeal Swab  Result Value Ref Range Status   SARS Coronavirus 2 NEGATIVE NEGATIVE Final    Comment: (NOTE) SARS-CoV-2 target nucleic acids are NOT DETECTED.  The SARS-CoV-2 RNA is generally detectable in upper and lower respiratory specimens during the acute phase of infection. The lowest concentration of SARS-CoV-2 viral copies this assay can detect is 250 copies / mL. A negative result does not preclude SARS-CoV-2 infection and should not be used as the sole basis for treatment or other patient management decisions.  A negative result may occur with improper specimen collection /  handling, submission of specimen other than nasopharyngeal swab, presence of viral mutation(s) within the areas targeted by this assay, and inadequate number of viral copies (<250 copies / mL). A negative result must be combined with clinical observations, patient history, and epidemiological information.  Fact Sheet for Patients:   StrictlyIdeas.no  Fact Sheet for Healthcare Providers: BankingDealers.co.za  This test is not yet approved or  cleared by the [place] FDA and has been authorized for detection and/or diagnosis of SARS-CoV-2 by FDA under an Emergency Use Authorization (EUA).  This EUA will remain in effect (meaning this test can be used) for the duration of the COVID-19 declaration under Section 564(b)(1) of the Act, 21 U.S.C. section 360bbb-3(b)(1), unless the authorization is terminated or revoked sooner.  Performed at [facility], [address], [place], [place] [address]  Impression:   [name] is a 23 y.o. black female with end stage renal disease on hemodialysis, hypertension, diabetes mellitus type II, coronary artery disease status post CABG, peripheral vascular disease status post bilateral lower extremity amputations, GERD, congestive heart failure, hyperlipidemia, who was admitted to [facility] on 05/13/2020 for Cardiac arrest and now admitted on January 29 with chief complaint of hypotension. Patient was diagnosed with NSTEMI and is on IV heparin  [facility] Nephrology MWF [facility] [address] RIJ permcath 52kg  1)Renal    End-stage renal disease Patient is a hemodialysis Patient is a Monday Wednesday Friday schedule Patient was last dialyzed as outpatient on Friday No need for renal placement therapy today   2) HTN Patient was sent to the ER with hypotension but patient blood pressure is on the higher side and stable     3)Anemia of chronic disease  CBC Latest Ref Rng &  Units 05/21/2020 05/20/2020 05/15/2020  WBC 4.0 - 10.5 K/uL 5.8 5.5 3.5(L)  Hemoglobin 12.0 - 15.0 g/dL 8.7(L) 8.5(L) 8.2(L)  Hematocrit 36.0 - 46.0 % 28.2(L) 27.8(L) 27.3(L)  Platelets 150 - 400 K/uL 148(L) 138(L) 73(L)       HGb is not at goal (9--11) We will keep patient on Epogen  4) Secondary hyperparathyroidism -CKD Mineral-Bone Disorder    Lab Results  Component Value Date   PTH 385 (H) 09/03/2019   CALCIUM 8.0 (L) 05/21/2020   PHOS 4.5 05/15/2020    Secondary Hyperparathyroidism present Phosphorus at goal.   5)NSTEMI  Patient has been closely followed by the cardiology and the primary team  6) Electrolytes   BMP Latest Ref Rng & Units 05/21/2020 05/20/2020 05/16/2020  Glucose 70 - 99 mg/dL 187(H) 156(H) 135(H)  BUN 8 - 23 mg/dL 23 17 24(H)  Creatinine 0.44 - 1.00 mg/dL 5.81(H) 3.85(H) 5.24(H)  Sodium 135 - 145 mmol/L 138 139 139  Potassium 3.5 - 5.1 mmol/L 3.1(L) 4.5 3.6  Chloride 98 - 111 mmol/L 99 95(L) 98  CO2 22 - 32 mmol/L 28 29 27   Calcium 8.9 - 10.3 mg/dL 8.0(L) 8.4(L) 7.8(L)     Sodium Normonatremic   Potassium Normokalemic    7)Acid base    Co2 at goal     Plan:   No need for renal replacement therapy today We will dialyze patient on Monday     Stephanie Ellis s Stephanie Ellis 05/21/2020, 1:05 PM

## 2020-05-21 NOTE — Progress Notes (Signed)
[facility] for Heparin Infusion Indication: ACS/nSTEMI  Allergies  Allergen Reactions  . Chlorthalidone Anaphylaxis, Itching and Rash  . Dilaudid [Hydromorphone Hcl] Anaphylaxis    Hypersensitivity. 0.73m caused pt to become unresponsive  . Fentanyl Rash  . Midazolam Rash  . Morphine And Related Anaphylaxis    Pt unresponsive, apneic after 0.517mdilaudid   . Ace Inhibitors Other (See Comments)    Reaction:  Hyperkalemia, agitation   . Angiotensin Receptor Blockers Other (See Comments)    Reaction:  Hyperkalemia, agitation   . Metoclopramide Other (See Comments)  . Norvasc [Amlodipine] Itching and Rash  . Phenergan [Promethazine Hcl] Anxiety    "antsy, can't sit still"    Patient Measurements: Height: 4' 4"  (132.1 cm) Weight: 54.4 kg (120 lb) IBW/kg (Calculated) : 27.1 Heparin Dosing Weight: 40kg Vital Signs: Temp: 98 F (36.7 C) (01/30 0052) BP: 156/57 (01/30 0052) Pulse Rate: 58 (01/30 0052)  Labs: Recent Labs    05/20/20 0322 05/20/20 0429 05/20/20 0915 05/20/20 1144 05/20/20 1329 05/20/20 1344 05/20/20 2331  HGB 8.5*  --   --   --   --   --   --   HCT 27.8*  --   --   --   --   --   --   PLT 138*  --   --   --   --   --   --   APTT  --  32  --   --   --  38* 40*  LABPROT  --  16.3*  --   --   --   --   --   INR  --  1.4*  --   --   --   --   --   HEPARINUNFRC  --  2.78*  --   --   --   --   --   CREATININE 3.85*  --   --   --   --   --   --   TROPONINIHS 598* 926* 1,697* 2,037* 2,084*  --   --     Estimated Creatinine Clearance: 8.3 mL/min (A) (by C-G formula based on SCr of 3.85 mg/dL (H)).   Medical History: Past Medical History:  Diagnosis Date  . (HFpEF) heart failure with preserved ejection fraction (HCCarlos   a. 01/2018 Echo: EF 50-55%, no rwma, Gr1 DD, triv AI, mild MR. Midly dil LA/RV, mod reduced RV fxn. Irreg thickening of TV w/ mobile echodensity that appears to arise from valve.  . Anemia   . Anorexia    . Bacteremia due to Pseudomonas   . Carotid arterial disease (HCMoody   a. 01/2017 Carotid U/S: RICA <4<77LICA <4<41 . Marland KitchenHF (congestive heart failure) (HCCentral Point  . Complication of anesthesia    a. Pt reports h/o complication on 9 different occasions - ? hypotension/arrest.  . 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 [facility], p-mLAD 50-60% (Resting FFR 0.75), mRCA 80-90%, ~40 Ost OM & D1-->CABG; b. 01/2016 Staged PCI of LCX x 2 and RCA.  . Depression 05/20/2020  . ESRD (end stage renal disease) on dialysis (HCLindsay   a. ESRD secondary to acute kidney failure s/p CABG-->PD  . Essential hypertension   . GERD (gastroesophageal reflux disease)   . Heart murmur   . Hypercholesterolemia   . Myocardial infarction (HCClayton2016  . PVD (peripheral vascular disease) (HCShoals  a. 01/30/2018 PV Angio: Sev Left Popliteal dzs s/p PTA w/ 44m lutonix DEB w/ mech aspiration of the L popliteal, L AT, and Left tibioperoneal trunck and peroneal artery.  . 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  [facility]: Dr. BMarland KitchenHaithcock: CABG x 3, LIMA to LAD, SVG to RCA, SVG to OM3, EVH  . Sinusitis 2019  . Type II diabetes mellitus with complication (HCC)    CAD    Medications:  Apixaban 2.5 mg BID  Assessment: Pt is 70yo female ESRD on HD arriving via form local nsg home report BP of 70/44 following dialysis on 1/28.  Upon [facility] SBP 137-196 [facility]  Goal of Therapy:  APTT level 66-102 Heparin level 0.3-0.7 units/ml Monitor platelets by anticoagulation protocol: Yes   0129 1344 aPTT 38, subtherapeutic 0129 2331 aPPT 40, subtherapeutic  Plan:  Heparin 1200 unit bolus followed by increase of heparin drip to 700 units/hr. Recheck aPTT and HL (for correlation) in 8 hours. CBC with morning labs.  NRenda Rolls PharmD, MBA 05/21/2020 1:16 AM

## 2020-05-21 NOTE — Progress Notes (Signed)
[place] for Heparin Infusion Indication: ACS/nSTEMI  Allergies  Allergen Reactions  . Chlorthalidone Anaphylaxis, Itching and Rash  . Dilaudid [Hydromorphone Hcl] Anaphylaxis    Hypersensitivity. 0.54m caused pt to become unresponsive  . Fentanyl Rash  . Midazolam Rash  . Morphine And Related Anaphylaxis    Pt unresponsive, apneic after 0.512mdilaudid   . Ace Inhibitors Other (See Comments)    Reaction:  Hyperkalemia, agitation   . Angiotensin Receptor Blockers Other (See Comments)    Reaction:  Hyperkalemia, agitation   . Metoclopramide Other (See Comments)  . Norvasc [Amlodipine] Itching and Rash  . Phenergan [Promethazine Hcl] Anxiety    "antsy, can't sit still"    Patient Measurements: Height: 4' 4"  (132.1 cm) Weight: 51.4 kg (113 lb 4.8 oz) IBW/kg (Calculated) : 27.1 Heparin Dosing Weight: 40kg Vital Signs: Temp: 98.3 F (36.8 C) (01/30 0720) BP: 160/53 (01/30 1005) Pulse Rate: 56 (01/30 1005)  Labs: Recent Labs    05/20/20 0322 05/20/20 0322 05/20/20 0429 05/20/20 0915 05/20/20 1144 05/20/20 1329 05/20/20 1344 05/20/20 2331 05/21/20 0457 05/21/20 0940  HGB 8.5*  --   --   --   --   --   --   --  8.7*  --   HCT 27.8*  --   --   --   --   --   --   --  28.2*  --   PLT 138*  --   --   --   --   --   --   --  148*  --   APTT  --    < > 32  --   --   --  38* 40*  --  46*  LABPROT  --   --  16.3*  --   --   --   --   --   --   --   INR  --   --  1.4*  --   --   --   --   --   --   --   HEPARINUNFRC  --   --  2.78*  --   --   --   --   --   --  0.90*  CREATININE 3.85*  --   --   --   --   --   --   --  5.81*  --   TROPONINIHS 598*  --  926* 1,697* 2,037* 2,084*  --   --   --   --    < > = values in this interval not displayed.    Estimated Creatinine Clearance: 5.3 mL/min (A) (by C-G formula based on SCr of 5.81 mg/dL (H)).   Medical History: Past Medical History:  Diagnosis Date  . (HFpEF) heart failure with  preserved ejection fraction ([place]   a. 01/2018 Echo: EF 50-55%, no rwma, Gr1 DD, triv AI, mild MR. Midly dil LA/RV, mod reduced RV fxn. Irreg thickening of TV w/ mobile echodensity that appears to arise from valve.  . Anemia   . Anorexia   . Bacteremia due to Pseudomonas   . Carotid arterial disease ([place]   a. 01/2017 Carotid U/S: RICA <4<85LICA <4<02 . Marland KitchenHF (congestive heart failure) (HCOasis  . Complication of anesthesia    a. Pt reports h/o complication on 9 different occasions - ? hypotension/arrest.  . 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 01/2015  [facility]): 70% LM, p-mLAD 50-60% (Resting FFR 0.75), mRCA 80-90%, ~40 Ost OM & D1-->CABG; b. 01/2016 Staged PCI of LCX x 2 and RCA.  . Depression 05/20/2020  . ESRD (end stage renal disease) on dialysis ([place])    a. ESRD secondary to acute kidney failure s/p CABG-->PD  . Essential hypertension   . GERD (gastroesophageal reflux disease)   . Heart murmur   . Hypercholesterolemia   . Myocardial infarction ([place]) 2016  . PVD (peripheral vascular disease) ([place])    a. 01/30/2018 PV Angio: Sev Left Popliteal dzs s/p PTA w/ 53m lutonix DEB w/ mech aspiration of the L popliteal, L AT, and Left tibioperoneal trunck and peroneal artery.  . 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  [facility]: Dr. BMarland KitchenHaithcock: CABG x 3, LIMA to LAD, SVG to RCA, SVG to OM3, EVH  . Sinusitis 2019  . Type II diabetes mellitus with complication (HCC)    CAD    Medications:  Apixaban 2.5 mg BID  Assessment: Pt is 70yo female ESRD on HD arriving via form local nsg home report BP of 70/44 following dialysis on 1/28.  Upon arrival SBP 137-196 and [facility]  Goal of Therapy:  APTT level 66-102 Heparin level 0.3-0.7 units/ml Monitor platelets by anticoagulation protocol: Yes   0129 1344 aPTT 38, subtherapeutic 0129 2331 aPPT 40, subtherapeutic 0130 0940 aPTT 46, HL 0.90 subtherapeutic  Plan:  Heparin 1200  unit bolus followed by increase of heparin drip to 800 units/hr. Recheck aPTT in 8 hours. CBC and HL with morning labs.  ADorena Bodo PharmD 05/21/2020 11:40 AM

## 2020-05-21 NOTE — Progress Notes (Signed)
[facility] Cardiology    SUBJECTIVE: Patient states to be doing reasonably well denies any chest pain no worsening shortness of breath resting comfortably  Dialysis tomorrow   Vitals:   05/21/20 0431 05/21/20 0720 05/21/20 1005 05/21/20 1145  BP: (!) 164/61 (!) 135/41 (!) 160/53 (!) 131/49  Pulse: (!) 56 (!) 50 (!) 56 60  Resp: 16 17    Temp: 97.9 F (36.6 C) 98.3 F (36.8 C)    TempSrc:      SpO2: 100% 99%  99%  Weight: 51.4 kg     Height:         Intake/Output Summary (Last 24 hours) at 05/21/2020 1213 Last data filed at 05/21/2020 0400 Gross per 24 hour  Intake 172.71 ml  Output --  Net 172.71 ml      PHYSICAL EXAM  General: Well developed, well nourished, in no acute distress HEENT:  Normocephalic and atramatic Neck:  No JVD.  Lungs: Clear bilaterally to auscultation and percussion. Heart: HRRR . Normal S1 and S2 without gallops or 2/6 sem murmurs.  Abdomen: Bowel sounds are positive, abdomen soft and non-tender  Msk:  Back normal, normal gait. Normal strength and tone for age. Extremities: Bilateral BKA Neuro: Alert and oriented X 3. Psych:  Good affect, responds appropriately   LABS: Basic Metabolic Panel: Recent Labs    05/20/20 0322 05/20/20 0429 05/21/20 0457  NA 139  --  138  K 4.5  --  3.1*  CL 95*  --  99  CO2 29  --  28  GLUCOSE 156*  --  187*  BUN 17  --  23  CREATININE 3.85*  --  5.81*  CALCIUM 8.4*  --  8.0*  MG  --  2.0  --    Liver Function Tests: No results for input(s): AST, ALT, ALKPHOS, BILITOT, PROT, ALBUMIN in the last 72 hours. No results for input(s): LIPASE, AMYLASE in the last 72 hours. CBC: Recent Labs    05/20/20 0322 05/21/20 0457  WBC 5.5 5.8  HGB 8.5* 8.7*  HCT 27.8* 28.2*  MCV 98.9 98.9  PLT 138* 148*   Cardiac Enzymes: No results for input(s): CKTOTAL, CKMB, CKMBINDEX, TROPONINI in the last 72 hours. BNP: Invalid input(s): POCBNP D-Dimer: No results for input(s): DDIMER in the last 72 hours. Hemoglobin  A1C: No results for input(s): HGBA1C in the last 72 hours. Fasting Lipid Panel: Recent Labs    05/21/20 0457  CHOL 73  HDL 37*  LDLCALC 24  TRIG 61  CHOLHDL 2.0   Thyroid Function Tests: No results for input(s): TSH, T4TOTAL, T3FREE, THYROIDAB in the last 72 hours.  Invalid input(s): FREET3 Anemia Panel: No results for input(s): VITAMINB12, FOLATE, FERRITIN, TIBC, IRON, RETICCTPCT in the last 72 hours.  DG Chest Portable 1 View  Result Date: 05/20/2020 CLINICAL DATA:  Non STEMI, ventricular tachycardia. Blood pressure 72/44. on dialysis. EXAM: PORTABLE CHEST 1 VIEW COMPARISON:  Chest x-ray 05/13/2020 FINDINGS: Left chest wall dialysis catheter with tip overlying the right atrium. Cardiac paddles overlie the patient. Sternotomy wires and cardiac surgical changes again noted overlying the mediastinum. The heart size and mediastinal contours are unchanged. Persistent cardiomegaly. Markedly limited retrocardiac evaluation due to overlying paddles. Persistent increased interstitial markings. Persistent trace right pleural effusion and likely trace to small volume left pleural effusion. No pneumothorax. No acute osseous findings. IMPRESSION: 1. Cardiomegaly with persistent pulmonary edema and bilateral trace to small pleural effusions. Overlying infection/inflammation not excluded. 2. Markedly limited retrocardiac evaluation due to overlying  paddles. Electronically Signed   By: Iven Finn M.D.   On: 05/20/2020 05:08     Echo : moderate to severely depressed left ventricular function 30-35%  TELEMETRY: Normal sinus rhythm rate of 75 bundle branch block nonspecific T2 changes  ASSESSMENT AND PLAN:  Principal Problem:   NSTEMI (non-ST elevated myocardial infarction) ([place]) Active Problems:   ESRD (end stage renal disease) on dialysis ([place])   Hypertension   Hyperlipidemia   Anemia in ESRD (end-stage renal disease) (HCC)   Seizure (HCC)   Chronic combined systolic and diastolic CHF  (congestive heart failure) (HCC)   Type II diabetes mellitus with renal manifestations (HCC)   Stroke (HCC)   CAD (coronary artery disease)   Depression   Prolonged QT interval   Hypotension   Nonsustained ventricular tachycardia (HCC)   Atrial fibrillation, chronic (HCC)    Plan Continue IV heparin for another 24 to 48 hours for non-STEMI Agree with dialysis therapy for end-stage renal disease Agree with hypertension management and control Continue lipid management for hyperlipidemia Continue diabetes management and control History of CVA continue current therapy with anticoagulation Continue atrial fibrillation therapy for anticoagulation Continue current therapy for congestive heart failure cardiomyopathy Echocardiogram may be helpful for reassessment of the ventricular function status post non-STEMI Do not recommend an invasive strategy at this point   Yolonda Kida, MD 05/21/2020 12:13 PM

## 2020-05-21 NOTE — Progress Notes (Signed)
Pt received to 2A, room 246 from ED.  Pt oriented to room and call bell.  See admit data base, assessment and vs's.  SB on the monitor.  Pt has fine crackles in lower bases.  Pt on O2 at 2 liters.  O2 sats 100%.  Pt denies pain or distress and only asks for ceral to eat, provided.

## 2020-05-21 NOTE — Progress Notes (Signed)
[place] for Heparin Infusion Indication: ACS/nSTEMI  Allergies  Allergen Reactions  . Chlorthalidone Anaphylaxis, Itching and Rash  . Dilaudid [Hydromorphone Hcl] Anaphylaxis    Hypersensitivity. 0.18m caused pt to become unresponsive  . Fentanyl Rash  . Midazolam Rash  . Morphine And Related Anaphylaxis    Pt unresponsive, apneic after 0.53mdilaudid   . Ace Inhibitors Other (See Comments)    Reaction:  Hyperkalemia, agitation   . Angiotensin Receptor Blockers Other (See Comments)    Reaction:  Hyperkalemia, agitation   . Metoclopramide Other (See Comments)  . Norvasc [Amlodipine] Itching and Rash  . Phenergan [Promethazine Hcl] Anxiety    "antsy, can't sit still"    Patient Measurements: Height: 4' 4"  (132.1 cm) Weight: 51.4 kg (113 lb 4.8 oz) IBW/kg (Calculated) : 27.1 Heparin Dosing Weight: 40kg Vital Signs: Temp: 98.2 F (36.8 C) (01/30 2034) Temp Source: Axillary (01/30 2034) BP: 179/49 (01/30 2034) Pulse Rate: 61 (01/30 2034)  Labs: Recent Labs    05/20/20 0322 05/20/20 0429 05/20/20 0915 05/20/20 1144 05/20/20 1329 05/20/20 1344 05/20/20 2331 05/21/20 0457 05/21/20 0940 05/21/20 2209  HGB 8.5*  --   --   --   --   --   --  8.7*  --   --   HCT 27.8*  --   --   --   --   --   --  28.2*  --   --   PLT 138*  --   --   --   --   --   --  148*  --   --   APTT  --  32  --   --   --    < > 40*  --  46* 42*  LABPROT  --  16.3*  --   --   --   --   --   --   --   --   INR  --  1.4*  --   --   --   --   --   --   --   --   HEPARINUNFRC  --  2.78*  --   --   --   --   --   --  0.90*  --   CREATININE 3.85*  --   --   --   --   --   --  5.81*  --   --   TROPONINIHS 598* 926* 1,697* 2,037* 2,084*  --   --   --   --   --    < > = values in this interval not displayed.    Estimated Creatinine Clearance: 5.3 mL/min (A) (by C-G formula based on SCr of 5.81 mg/dL (H)).   Medical History: Past Medical History:  Diagnosis  Date  . (HFpEF) heart failure with preserved ejection fraction ([place]   a. 01/2018 Echo: EF 50-55%, no rwma, Gr1 DD, triv AI, mild MR. Midly dil LA/RV, mod reduced RV fxn. Irreg thickening of TV w/ mobile echodensity that appears to arise from valve.  . Anemia   . Anorexia   . Bacteremia due to Pseudomonas   . Carotid arterial disease (HC[place]   a. 01/2017 Carotid U/S: RICA <4<70LICA <4<26 . Marland KitchenHF (congestive heart failure) (HCCook  . Complication of anesthesia    a. Pt reports h/o complication on 9 different occasions - ? hypotension/arrest.  . Coronary artery disease involving left main coronary artery  01/2015   a. 01/2015 [facility]): 70% LM, p-mLAD 50-60% (Resting FFR 0.75), mRCA 80-90%, ~40 Ost OM & D1-->CABG; b. 01/2016 Staged PCI of LCX x 2 and RCA.  . Depression 05/20/2020  . ESRD (end stage renal disease) on dialysis ([place])    a. ESRD secondary to acute kidney failure s/p CABG-->PD  . Essential hypertension   . GERD (gastroesophageal reflux disease)   . Heart murmur   . Hypercholesterolemia   . Myocardial infarction ([place]) 2016  . PVD (peripheral vascular disease) ([place])    a. 01/30/2018 PV Angio: Sev Left Popliteal dzs s/p PTA w/ 30m lutonix DEB w/ mech aspiration of the L popliteal, L AT, and Left tibioperoneal trunck and peroneal artery.  . 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  [facility]: Dr. BMarland KitchenHaithcock: CABG x 3, LIMA to LAD, SVG to RCA, SVG to OM3, EVH  . Sinusitis 2019  . Type II diabetes mellitus with complication (HCC)    CAD    Medications:  Apixaban 2.5 mg BID  Assessment: Pt is 70yo female ESRD on HD arriving via form local nsg home report BP of 70/44 following dialysis on 1/28.  Upon arrival SBP 137-196 and [facility]  Goal of Therapy:  APTT level 66-102 Heparin level 0.3-0.7 units/ml Monitor platelets by anticoagulation protocol: Yes   0129 1344 aPTT 38, subtherapeutic 0129 2331 aPPT 40, subtherapeutic 0130 0940 aPTT 46, HL 0.90  subtherapeutic 0130 2209 aPTT 42, subtherapeutic  Plan:  Heparin 1200 unit bolus followed by increase of heparin drip to 900 units/hr. Recheck aPTT in 8 hours. CBC and HL with morning labs.  NRenda Rolls PharmD, MScottsdale Eye Surgery Center Pc1/30/2022 11:57 PM

## 2020-05-21 NOTE — Progress Notes (Signed)
PROGRESS NOTE    Stephanie Ellis  WUX:[id number] DOB: Jun 13, 1950 DOA: 05/20/2020 PCP: Pcp, No   Follow-up on non-STEMI. Brief Narrative:  Stephanie Ellis is a 70 y.o. female with medical history significant of PAD status post bilateral BKA, A. fib on apixaban, cardiac arrest/PEA arrest felt to be secondary to Dilaudid use, CAD, CABG, ESRD-HD, HTN, HLD, DM, stroke, PAD, depression, CHF with EF30-35%, anemia, seizure, who presents with hypotension.  Upon arriving the hospital, she is diagnosed with non-STEMI with peak troponin of 2084.  She has been seen by cardiology.  Heparin drip started  Assessment & Plan:   Principal Problem:   NSTEMI (non-ST elevated myocardial infarction) ([place]) Active Problems:   ESRD (end stage renal disease) on dialysis ([place])   Hypertension   Hyperlipidemia   Anemia in ESRD (end-stage renal disease) (HCC)   Seizure (HCC)   Chronic combined systolic and diastolic CHF (congestive heart failure) (HCC)   Type II diabetes mellitus with renal manifestations (HCC)   Stroke (HCC)   CAD (coronary artery disease)   Depression   Prolonged QT interval   Hypotension   Nonsustained ventricular tachycardia (HCC)   Atrial fibrillation, chronic ([place])  #1.  Non-STEMI. Chronic atrial fibrillation. Chronic combined systolic and diastolic congestive heart failure. Transient hypotension. Patient has been evaluated by cardiology, decided to treat with heparin for 2 to 3 days.  No plan for intervention. Continue current treatment with aspirin, Lipitor Imdur, Cozaar and Coreg. Currently no evidence of CHF exacerbation. Patient had a transient hypotension after dialysis, this is not a concern.  #2 end-stage renal disease. Followed by nephrology.  #3.  Essential hypertension. Continue home medicines.  #4.  History of stroke. Continue aspirin and Lipitor.  #4.  Type 2 diabetes with renal failure. Continue current regimen.     DVT prophylaxis: Heparin drip Code Status:  Partial Family Communication:  Disposition Plan:  .   Status is: Inpatient  Remains inpatient appropriate because:Inpatient level of care appropriate due to severity of illness   Dispo: The patient is from: Home              Anticipated d/c is to: Home              Anticipated d/c date is: 2 days              Patient currently is not medically stable to d/c.   Difficult to place patient No        I/O last 3 completed shifts: In: 172.7 [I.V.:172.7] Out: -  No intake/output data recorded.     Consultants:   Cardiology  Procedures: None  Antimicrobials: None  Subjective: Patient doing well today.  Currently she denies any chest pain or shortness of breath. She has no abdominal pain or nausea vomiting. No fever or chills. She is not making much urine.  Objective: Vitals:   05/21/20 0052 05/21/20 0431 05/21/20 0720 05/21/20 1005  BP: (!) 156/57 (!) 164/61 (!) 135/41 (!) 160/53  Pulse: (!) 58 (!) 56 (!) 50 (!) 56  Resp: 16 16 17    Temp: 98 F (36.7 C) 97.9 F (36.6 C) 98.3 F (36.8 C)   TempSrc:      SpO2: 100% 100% 99%   Weight:  51.4 kg    Height:        Intake/Output Summary (Last 24 hours) at 05/21/2020 1008 Last data filed at 05/21/2020 0400 Gross per 24 hour  Intake 172.71 ml  Output --  Net 172.71 ml  Filed Weights   05/20/20 0044 05/21/20 0431  Weight: 54.4 kg 51.4 kg    Examination:  General exam: Appears calm and comfortable  Respiratory system: Clear to auscultation. Respiratory effort normal. Cardiovascular system: S1 & S2 heard, RRR. No JVD, murmurs, rubs, gallops or clicks. No pedal edema. Gastrointestinal system: Abdomen is nondistended, soft and nontender. No organomegaly or masses felt. Normal bowel sounds heard. Central nervous system: Alert and oriented. No focal neurological deficits. Extremities: Bilateral BKA. Skin: No rashes, lesions or ulcers Psychiatry:  Mood & affect appropriate.     Data Reviewed: I have personally  reviewed following labs and imaging studies  CBC: Recent Labs  Lab 05/15/20 0321 05/20/20 0322 05/21/20 0457  WBC 3.5* 5.5 5.8  NEUTROABS 2.3  --   --   HGB 8.2* 8.5* 8.7*  HCT 27.3* 27.8* 28.2*  MCV 98.9 98.9 98.9  PLT 73* 138* 614*   Basic Metabolic Panel: Recent Labs  Lab 05/14/20 2027 05/15/20 0321 05/16/20 0605 05/20/20 0322 05/20/20 0429 05/21/20 0457  NA  --  140 139 139  --  138  K 4.0 4.2 3.6 4.5  --  3.1*  CL  --  94* 98 95*  --  99  CO2  --  30 27 29   --  28  GLUCOSE  --  166* 135* 156*  --  187*  BUN  --  45* 24* 17  --  23  CREATININE  --  7.67* 5.24* 3.85*  --  5.81*  CALCIUM  --  7.8* 7.8* 8.4*  --  8.0*  MG  --  2.0  --   --  2.0  --   PHOS  --  4.5  --   --   --   --    GFR: Estimated Creatinine Clearance: 5.3 mL/min (A) (by C-G formula based on SCr of 5.81 mg/dL (H)). Liver Function Tests: Recent Labs  Lab 05/15/20 0321  AST 16  ALT 14  ALKPHOS 69  BILITOT 0.7  PROT 6.5  ALBUMIN 3.1*   No results for input(s): LIPASE, AMYLASE in the last 168 hours. No results for input(s): AMMONIA in the last 168 hours. Coagulation Profile: Recent Labs  Lab 05/20/20 0429  INR 1.4*   Cardiac Enzymes: No results for input(s): CKTOTAL, CKMB, CKMBINDEX, TROPONINI in the last 168 hours. BNP (last 3 results) No results for input(s): PROBNP in the last 8760 hours. HbA1C: No results for input(s): HGBA1C in the last 72 hours. CBG: Recent Labs  Lab 05/20/20 0922 05/20/20 1143 05/20/20 1552 05/20/20 2205 05/21/20 1002  GLUCAP 100* 111* 103* 132* 216*   Lipid Profile: Recent Labs    05/21/20 0457  CHOL 73  HDL 37*  LDLCALC 24  TRIG 61  CHOLHDL 2.0   Thyroid Function Tests: No results for input(s): TSH, T4TOTAL, FREET4, T3FREE, THYROIDAB in the last 72 hours. Anemia Panel: No results for input(s): VITAMINB12, FOLATE, FERRITIN, TIBC, IRON, RETICCTPCT in the last 72 hours. Sepsis Labs: No results for input(s): PROCALCITON, LATICACIDVEN in the  last 168 hours.  Recent Results (from the past 240 hour(s))  SARS Coronavirus 2 by RT PCR (hospital order, performed in [facility] hospital lab) Nasopharyngeal Nasopharyngeal Swab     Status: None   Collection Time: 05/13/20  6:08 PM   Specimen: Nasopharyngeal Swab  Result Value Ref Range Status   SARS Coronavirus 2 NEGATIVE NEGATIVE Final    Comment: (NOTE) SARS-CoV-2 target nucleic acids are NOT DETECTED.  The SARS-CoV-2 RNA is  generally detectable in upper and lower respiratory specimens during the acute phase of infection. The lowest concentration of SARS-CoV-2 viral copies this assay can detect is 250 copies / mL. A negative result does not preclude SARS-CoV-2 infection and should not be used as the sole basis for treatment or other patient management decisions.  A negative result may occur with improper specimen collection / handling, submission of specimen other than nasopharyngeal swab, presence of viral mutation(s) within the areas targeted by this assay, and inadequate number of viral copies (<250 copies / mL). A negative result must be combined with clinical observations, patient history, and epidemiological information.  Fact Sheet for Patients:   StrictlyIdeas.no  Fact Sheet for Healthcare Providers: BankingDealers.co.za  This test is not yet approved or  cleared by the [place] FDA and has been authorized for detection and/or diagnosis of SARS-CoV-2 by FDA under an Emergency Use Authorization (EUA).  This EUA will remain in effect (meaning this test can be used) for the duration of the COVID-19 declaration under Section 564(b)(1) of the Act, 21 U.S.C. section 360bbb-3(b)(1), unless the authorization is terminated or revoked sooner.  Performed at [facility], [address] [place], [place] [address]   SARS Coronavirus 2 by RT PCR (hospital order, performed in [facility] hospital lab) Nasopharyngeal  Nasopharyngeal Swab     Status: None   Collection Time: 05/20/20  5:00 AM   Specimen: Nasopharyngeal Swab  Result Value Ref Range Status   SARS Coronavirus 2 NEGATIVE NEGATIVE Final    Comment: (NOTE) SARS-CoV-2 target nucleic acids are NOT DETECTED.  The SARS-CoV-2 RNA is generally detectable in upper and lower respiratory specimens during the acute phase of infection. The lowest concentration of SARS-CoV-2 viral copies this assay can detect is 250 copies / mL. A negative result does not preclude SARS-CoV-2 infection and should not be used as the sole basis for treatment or other patient management decisions.  A negative result may occur with improper specimen collection / handling, submission of specimen other than nasopharyngeal swab, presence of viral mutation(s) within the areas targeted by this assay, and inadequate number of viral copies (<250 copies / mL). A negative result must be combined with clinical observations, patient history, and epidemiological information.  Fact Sheet for Patients:   StrictlyIdeas.no  Fact Sheet for Healthcare Providers: BankingDealers.co.za  This test is not yet approved or  cleared by the [place] FDA and has been authorized for detection and/or diagnosis of SARS-CoV-2 by FDA under an Emergency Use Authorization (EUA).  This EUA will remain in effect (meaning this test can be used) for the duration of the COVID-19 declaration under Section 564(b)(1) of the Act, 21 U.S.C. section 360bbb-3(b)(1), unless the authorization is terminated or revoked sooner.  Performed at [facility], [address], [place], [place] [address]          Radiology Studies: DG Chest Portable 1 View  Result Date: 05/20/2020 CLINICAL DATA:  Non STEMI, ventricular tachycardia. Blood pressure 72/44. on dialysis. EXAM: PORTABLE CHEST 1 VIEW COMPARISON:  Chest x-ray 05/13/2020 FINDINGS: Left chest wall  dialysis catheter with tip overlying the right atrium. Cardiac paddles overlie the patient. Sternotomy wires and cardiac surgical changes again noted overlying the mediastinum. The heart size and mediastinal contours are unchanged. Persistent cardiomegaly. Markedly limited retrocardiac evaluation due to overlying paddles. Persistent increased interstitial markings. Persistent trace right pleural effusion and likely trace to small volume left pleural effusion. No pneumothorax. No acute osseous findings. IMPRESSION: 1. Cardiomegaly  with persistent pulmonary edema and bilateral trace to small pleural effusions. Overlying infection/inflammation not excluded. 2. Markedly limited retrocardiac evaluation due to overlying paddles. Electronically Signed   By: Iven Finn M.D.   On: 05/20/2020 05:08        Scheduled Meds: . aspirin EC  81 mg Oral Daily  . atorvastatin  80 mg Oral QHS  . brimonidine  1 drop Both Eyes TID  . carvedilol  25 mg Oral BID WC  . fluticasone  2 spray Each Nare Daily  . hydrALAZINE  75 mg Oral Q8H  . insulin aspart  0-5 Units Subcutaneous QHS  . insulin aspart  0-9 Units Subcutaneous TID WC  . ipratropium  2 spray Each Nare BID  . isosorbide mononitrate  60 mg Oral Daily  . [START ON 05/22/2020] levETIRAcetam  250 mg Oral Q M,W,F  . levETIRAcetam  500 mg Oral BID  . losartan  50 mg Oral Daily  . omega-3 acid ethyl esters  2 g Oral Daily  . pantoprazole  40 mg Oral Daily  . polyvinyl alcohol  1 drop Both Eyes BID  . potassium chloride  40 mEq Oral Once  . senna-docusate  1 tablet Oral BID  . sevelamer carbonate  1,600 mg Oral TID WC  . thiamine  100 mg Oral Daily  . timolol  1 drop Both Eyes TID  . vitamin B-12  1,000 mcg Oral Daily   Continuous Infusions: . heparin 700 Units/hr (05/21/20 0127)     LOS: 1 day    Time spent: 28 minutes    Sharen Hones, MD [facility]   To contact the attending provider between 7A-7P or the covering provider during  after hours 7P-7A, please log into the web site [email or web address] and access using universal [place] password for that web site. If you do not have the password, please call the hospital operator.  05/21/2020, 10:08 AM

## 2020-05-22 DIAGNOSIS — N186 End stage renal disease: Secondary | ICD-10-CM | POA: Diagnosis not present

## 2020-05-22 DIAGNOSIS — D631 Anemia in chronic kidney disease: Secondary | ICD-10-CM | POA: Diagnosis not present

## 2020-05-22 DIAGNOSIS — I482 Chronic atrial fibrillation, unspecified: Secondary | ICD-10-CM | POA: Diagnosis not present

## 2020-05-22 DIAGNOSIS — I214 Non-ST elevation (NSTEMI) myocardial infarction: Secondary | ICD-10-CM | POA: Diagnosis not present

## 2020-05-22 LAB — CBC WITH DIFFERENTIAL/PLATELET
Abs Immature Granulocytes: 0.05 10*3/uL (ref 0.00–0.07)
Basophils Absolute: 0 10*3/uL (ref 0.0–0.1)
Basophils Relative: 0 %
Eosinophils Absolute: 0.1 10*3/uL (ref 0.0–0.5)
Eosinophils Relative: 1 %
HCT: 26.8 % — ABNORMAL LOW (ref 36.0–46.0)
Hemoglobin: 8 g/dL — ABNORMAL LOW (ref 12.0–15.0)
Immature Granulocytes: 1 %
Lymphocytes Relative: 11 %
Lymphs Abs: 0.6 10*3/uL — ABNORMAL LOW (ref 0.7–4.0)
MCH: 29.6 pg (ref 26.0–34.0)
MCHC: 29.9 g/dL — ABNORMAL LOW (ref 30.0–36.0)
MCV: 99.3 fL (ref 80.0–100.0)
Monocytes Absolute: 0.5 10*3/uL (ref 0.1–1.0)
Monocytes Relative: 8 %
Neutro Abs: 4.7 10*3/uL (ref 1.7–7.7)
Neutrophils Relative %: 79 %
Platelets: 157 10*3/uL (ref 150–400)
RBC: 2.7 MIL/uL — ABNORMAL LOW (ref 3.87–5.11)
RDW: 18.1 % — ABNORMAL HIGH (ref 11.5–15.5)
WBC: 5.9 10*3/uL (ref 4.0–10.5)
nRBC: 0 % (ref 0.0–0.2)

## 2020-05-22 LAB — HEMOGLOBIN A1C
Hgb A1c MFr Bld: 7.4 % — ABNORMAL HIGH (ref 4.8–5.6)
Mean Plasma Glucose: 165.68 mg/dL

## 2020-05-22 LAB — BASIC METABOLIC PANEL
Anion gap: 12 (ref 5–15)
BUN: 33 mg/dL — ABNORMAL HIGH (ref 8–23)
CO2: 28 mmol/L (ref 22–32)
Calcium: 8.3 mg/dL — ABNORMAL LOW (ref 8.9–10.3)
Chloride: 98 mmol/L (ref 98–111)
Creatinine, Ser: 7.85 mg/dL — ABNORMAL HIGH (ref 0.44–1.00)
GFR, Estimated: 5 mL/min — ABNORMAL LOW (ref >60)
Glucose, Bld: 181 mg/dL — ABNORMAL HIGH (ref 70–99)
Potassium: 4 mmol/L (ref 3.5–5.1)
Sodium: 138 mmol/L (ref 135–145)

## 2020-05-22 LAB — MAGNESIUM: Magnesium: 2 mg/dL (ref 1.7–2.4)

## 2020-05-22 LAB — APTT
aPTT: 60 seconds — ABNORMAL HIGH (ref 24–36)
aPTT: 34 seconds (ref 24–36)

## 2020-05-22 LAB — HEPARIN LEVEL (UNFRACTIONATED)
Heparin Unfractionated: 0.46 IU/mL (ref 0.30–0.70)
Heparin Unfractionated: 1.1 IU/mL — ABNORMAL HIGH (ref 0.30–0.70)

## 2020-05-22 LAB — GLUCOSE, CAPILLARY
Glucose-Capillary: 163 mg/dL — ABNORMAL HIGH (ref 70–99)
Glucose-Capillary: 170 mg/dL — ABNORMAL HIGH (ref 70–99)
Glucose-Capillary: 145 mg/dL — ABNORMAL HIGH (ref 70–99)

## 2020-05-22 MED ORDER — ALTEPLASE 2 MG IJ SOLR: 2.0000 mg | Freq: Once | INTRAMUSCULAR | Status: DC | PRN

## 2020-05-22 MED ORDER — HEPARIN SODIUM (PORCINE) 1000 UNIT/ML DIALYSIS
1000.0000 [IU] | INTRAMUSCULAR | Status: DC | PRN
  Filled 2020-05-22: qty 1

## 2020-05-22 MED ORDER — HEPARIN SODIUM (PORCINE) 1000 UNIT/ML DIALYSIS
20.0000 [IU]/kg | INTRAMUSCULAR | Status: DC | PRN
  Administered 2020-05-22: 3400 [IU] via INTRAVENOUS_CENTRAL
  Filled 2020-05-22 (×2): qty 1

## 2020-05-22 MED ORDER — SODIUM CHLORIDE 0.9 % IV SOLN: 100.0000 mL | INTRAVENOUS | Status: DC | PRN

## 2020-05-22 MED ORDER — LIDOCAINE HCL (PF) 1 % IJ SOLN
5.0000 mL | INTRAMUSCULAR | Status: DC | PRN
  Filled 2020-05-22: qty 5

## 2020-05-22 MED ORDER — LIDOCAINE-PRILOCAINE 2.5-2.5 % EX CREA
1.0000 "application " | TOPICAL_CREAM | CUTANEOUS | Status: DC | PRN
  Filled 2020-05-22: qty 5

## 2020-05-22 MED ORDER — PENTAFLUOROPROP-TETRAFLUOROETH EX AERO
1.0000 "application " | INHALATION_SPRAY | CUTANEOUS | Status: DC | PRN
  Filled 2020-05-22: qty 30

## 2020-05-22 MED ORDER — APIXABAN 2.5 MG PO TABS
2.5000 mg | ORAL_TABLET | Freq: Two times a day (BID) | ORAL | Status: DC
  Administered 2020-05-22 – 2020-05-23 (×3): 2.5 mg via ORAL
  Filled 2020-05-22 (×3): qty 1

## 2020-05-22 MED ORDER — HEPARIN BOLUS VIA INFUSION
1200.0000 [IU] | Freq: Once | INTRAVENOUS | Status: AC
  Administered 2020-05-22: 1200 [IU] via INTRAVENOUS
  Filled 2020-05-22: qty 1200

## 2020-05-22 MED ORDER — HEPARIN (PORCINE) 25000 UT/250ML-% IV SOLN
1000.0000 [IU]/h | INTRAVENOUS | Status: DC
  Administered 2020-05-22: 900 [IU]/h via INTRAVENOUS

## 2020-05-22 NOTE — Progress Notes (Signed)
[place], [place] 05/22/20  Subjective:   LOS: 2 Hospital course: Patient originally presented to the ER via EMS from [facility] where blood pressure was 70/44. Admitted for evaluation because of elevated troponin trend Uses chronic oxygen 2 L at baseline  Patient seen during dialysis Tolerating well    HEMODIALYSIS FLOWSHEET:  Blood Flow Rate (mL/min): 400 mL/min Arterial Pressure (mmHg): -160 mmHg Venous Pressure (mmHg): 210 mmHg Transmembrane Pressure (mmHg): 80 mmHg Ultrafiltration Rate (mL/min): 830 mL/min Dialysate Flow Rate (mL/min): 500 ml/min Conductivity: Machine : 13.9 Conductivity: Machine : 13.9 Dialysis Fluid Bolus: Normal Saline Bolus Amount (mL): 200 mL     Objective:  Vital signs in last 24 hours:  Temp:  [97.6 F (36.4 C)-98.2 F (36.8 C)] 97.6 F (36.4 C) (01/31 0418) Pulse Rate:  [55-61] 55 (01/31 0418) Resp:  [17] 17 (01/31 0418) BP: (117-179)/(46-53) 154/48 (01/31 0418) SpO2:  [99 %-100 %] 100 % (01/31 0418) Weight:  [52.5 kg] 52.5 kg (01/31 0418)  Weight change: 1.134 kg Filed Weights   05/20/20 0044 05/21/20 0431 05/22/20 0418  Weight: 54.4 kg 51.4 kg 52.5 kg    Intake/Output:    Intake/Output Summary (Last 24 hours) at 05/22/2020 0943 Last data filed at 05/22/2020 0500 Gross per 24 hour  Intake 43.77 ml  Output --  Net 43.77 ml    Physical Exam: General:  No acute distress, laying in the bed  HEENT  anicteric, moist oral mucous membrane  Pulm/lungs  normal breathing effort, lungs are clear to auscultation  CVS/Heart  no rub or gallop  Abdomen:   Soft, nontender  Extremities:  Lower extremity amputations  Neurologic:  Alert,  able to follow commands  Skin: Warm, dry    Basic Metabolic Panel:  Recent Labs  Lab 05/16/20 0605 05/20/20 0322 05/20/20 0429 05/21/20 0457 05/22/20 0850  NA 139 139  --  138 138  K 3.6 4.5  --  3.1* 4.0  CL 98 95*  --  99 98  CO2 27 29  --  28 28  GLUCOSE  135* 156*  --  187* 181*  BUN 24* 17  --  23 33*  CREATININE 5.24* 3.85*  --  5.81* 7.85*  CALCIUM 7.8* 8.4*  --  8.0* 8.3*  MG  --   --  2.0  --  2.0     CBC: Recent Labs  Lab 05/20/20 0322 05/21/20 0457 05/22/20 0850  WBC 5.5 5.8 5.9  NEUTROABS  --   --  4.7  HGB 8.5* 8.7* 8.0*  HCT 27.8* 28.2* 26.8*  MCV 98.9 98.9 99.3  PLT 138* 148* 157      Lab Results  Component Value Date   HEPBSAG NON REACTIVE 04/16/2020   HEPBSAB Reactive (A) 05/02/2019      Microbiology:  Recent Results (from the past 240 hour(s))  SARS Coronavirus 2 by RT PCR (hospital order, performed in [place] hospital lab) Nasopharyngeal Nasopharyngeal Swab     Status: None   Collection Time: 05/13/20  6:08 PM   Specimen: Nasopharyngeal Swab  Result Value Ref Range Status   SARS Coronavirus 2 NEGATIVE NEGATIVE Final    Comment: (NOTE) SARS-CoV-2 target nucleic acids are NOT DETECTED.  The SARS-CoV-2 RNA is generally detectable in upper and lower respiratory specimens during the acute phase of infection. The lowest concentration of SARS-CoV-2 viral copies this assay can detect is 250 copies / mL. A negative result does not preclude SARS-CoV-2 infection and should not be  used as the sole basis for treatment or other patient management decisions.  A negative result may occur with improper specimen collection / handling, submission of specimen other than nasopharyngeal swab, presence of viral mutation(s) within the areas targeted by this assay, and inadequate number of viral copies (<250 copies / mL). A negative result must be combined with clinical observations, patient history, and epidemiological information.  Fact Sheet for Patients:   StrictlyIdeas.no  Fact Sheet for Healthcare Providers: BankingDealers.co.za  This test is not yet approved or  cleared by the [place] FDA and has been authorized for detection and/or diagnosis of  SARS-CoV-2 by FDA under an Emergency Use Authorization (EUA).  This EUA will remain in effect (meaning this test can be used) for the duration of the COVID-19 declaration under Section 564(b)(1) of the Act, 21 U.S.C. section 360bbb-3(b)(1), unless the authorization is terminated or revoked sooner.  Performed at [facility], [address] [place], [place] [address]   SARS Coronavirus 2 by RT PCR (hospital order, performed in [facility] hospital lab) Nasopharyngeal Nasopharyngeal Swab     Status: None   Collection Time: 05/20/20  5:00 AM   Specimen: Nasopharyngeal Swab  Result Value Ref Range Status   SARS Coronavirus 2 NEGATIVE NEGATIVE Final    Comment: (NOTE) SARS-CoV-2 target nucleic acids are NOT DETECTED.  The SARS-CoV-2 RNA is generally detectable in upper and lower respiratory specimens during the acute phase of infection. The lowest concentration of SARS-CoV-2 viral copies this assay can detect is 250 copies / mL. A negative result does not preclude SARS-CoV-2 infection and should not be used as the sole basis for treatment or other patient management decisions.  A negative result may occur with improper specimen collection / handling, submission of specimen other than nasopharyngeal swab, presence of viral mutation(s) within the areas targeted by this assay, and inadequate number of viral copies (<250 copies / mL). A negative result must be combined with clinical observations, patient history, and epidemiological information.  Fact Sheet for Patients:   StrictlyIdeas.no  Fact Sheet for Healthcare Providers: BankingDealers.co.za  This test is not yet approved or  cleared by the [place] FDA and has been authorized for detection and/or diagnosis of SARS-CoV-2 by FDA under an Emergency Use Authorization (EUA).  This EUA will remain in effect (meaning this test can be used) for the duration of the COVID-19  declaration under Section 564(b)(1) of the Act, 21 U.S.C. section 360bbb-3(b)(1), unless the authorization is terminated or revoked sooner.  Performed at [facility], [address] [place], [place] [address]     Coagulation Studies: Recent Labs    05/20/20 0429  LABPROT 16.3*  INR 1.4*    Urinalysis: No results for input(s): COLORURINE, LABSPEC, PHURINE, GLUCOSEU, HGBUR, BILIRUBINUR, KETONESUR, PROTEINUR, UROBILINOGEN, NITRITE, LEUKOCYTESUR in the last 72 hours.  Invalid input(s): APPERANCEUR    Imaging: No results found.   Medications:   . sodium chloride    . sodium chloride    . heparin 900 Units/hr (05/22/20 0008)   . aspirin EC  81 mg Oral Daily  . atorvastatin  80 mg Oral QHS  . brimonidine  1 drop Both Eyes TID  . carvedilol  25 mg Oral BID WC  . Chlorhexidine Gluconate Cloth  6 each Topical Q0600  . epoetin (EPOGEN/PROCRIT) injection  6,000 Units Subcutaneous Q M,W,F-HD  . fluticasone  2 spray Each Nare Daily  . hydrALAZINE  75 mg Oral Q8H  . insulin aspart  0-5  Units Subcutaneous QHS  . insulin aspart  0-9 Units Subcutaneous TID WC  . ipratropium  2 spray Each Nare BID  . isosorbide mononitrate  60 mg Oral Daily  . levETIRAcetam  250 mg Oral Q M,W,F  . levETIRAcetam  500 mg Oral BID  . losartan  50 mg Oral Daily  . omega-3 acid ethyl esters  2 g Oral Daily  . pantoprazole  40 mg Oral Daily  . polyvinyl alcohol  1 drop Both Eyes BID  . senna-docusate  1 tablet Oral BID  . sevelamer carbonate  1,600 mg Oral TID WC  . thiamine  100 mg Oral Daily  . timolol  1 drop Both Eyes TID  . vitamin B-12  1,000 mcg Oral Daily   sodium chloride, sodium chloride, acetaminophen, alteplase, guaiFENesin-dextromethorphan, heparin, heparin, hydrALAZINE, lidocaine (PF), lidocaine-prilocaine, LORazepam, meclizine, menthol-cetylpyridinium, pentafluoroprop-tetrafluoroeth, triamcinolone  Assessment/ Plan:  70 y.o. female with end stage renal disease on  hemodialysis, hypertension, diabetes mellitus type II, coronary artery disease status post CABG, peripheral vascular disease status post bilateral lower extremity amputations, GERD, congestive heart failure, hyperlipidemia was admitted on 05/20/2020 for  Principal Problem:   NSTEMI (non-ST elevated myocardial infarction) ([place]) Active Problems:   ESRD (end stage renal disease) on dialysis ([place])   Hypertension   Hyperlipidemia   Anemia in ESRD (end-stage renal disease) (HCC)   Seizure (HCC)   Chronic combined systolic and diastolic CHF (congestive heart failure) (HCC)   Type II diabetes mellitus with renal manifestations (HCC)   Stroke (HCC)   CAD (coronary artery disease)   Depression   Prolonged QT interval   Hypotension   Nonsustained ventricular tachycardia (HCC)   Atrial fibrillation, chronic (HCC)  V-tach (HCC) [I47.2] NSTEMI (non-ST elevated myocardial infarction) ([place]) [I21.4]  [facility] MWF [facility] [address] RIJ permcath 52kg  #. ESRD Continue dialysis on Monday Wednesday and Friday schedule Tolerating well May need trial of dialysis in chair during next treatment   #. Anemia of CKD  Lab Results  Component Value Date   HGB 8.0 (L) 05/22/2020   Low dose EPO with HD  #. Secondary hyperparathyroidism of renal origin N 25.81      Component Value Date/Time   PTH 385 (H) 09/03/2019 0039   Lab Results  Component Value Date   PHOS 4.5 05/15/2020   Monitor calcium and phos level during this admission   #. Diabetes type 2 with CKD Hgb A1c MFr Bld (%)  Date Value  05/21/2020 7.4 (H)     LOS: [place] 1/31/20229:43 AM  [facility] [place], [place]

## 2020-05-22 NOTE — Consult Note (Signed)
   Heart Failure Nurse Navigator Note  HFrEF 30 to 35%.  Grade 3 diastolic function.  Severely reduced right ventricular systolic function.  She was admitted with non-ST elevation MI, currently on IV heparin.   Comorbidities:  Coronary artery disease End-stage renal disease on dialysis Monday Wednesday Friday Hypertension Hyperlipidemia  Diabetes type 2 Depression Atrial fibrillation   She also has history of stroke, nonsustained VT and seizures   Medication:  Aspirin 81 mg daily Atorvastatin 80 mg at bedtime Coreg 25 mg 2 times a day Hydralazine 75 mg every 8 hours Isosorbide mononitrate 60 mg daily Losartan 50 mg daily  Labs:  Sodium 138 potassium 3.1 chloride 99, CO2 28, BUN 25, creatinine 5.81, GFR 7, hemoglobin 8.7, hematocrit 28, Intake 43 mL BMI 30.1 Blood pressure 154/48 Weight 52.5 kg    Assessment:  General-she is awake and alert lying in bed in no acute distress.  HEENT-normocephalic, no JVD noted.  Cardiac-heart tones of regular rate and rhythm with systolic murmur.  Chest-breath sounds are clear to posterior auscultation, she does have a loose sounding cough but she states it is nonproductive.   Abdomen-soft non tender  Musculoskeletal-bilateral below the knee amputee  Psych-is pleasant and appropriate makes good eye contact.  Neurologic-speech is clear.    Initial meeting with patient.   Discussed heart failure, she voices understanding.  States that she currently lives in a skilled facility, [facility].  States that she gets weighed on the days that she has dialysis which is Monday Wednesday and Friday.  She states that she does not add salt to her food.  She also tries to to eat foods high in sodium.  States that she does not drink fluids/liquids throughout the day, she will just take sips when she is thirsty and with her meals and medications.  Offered her heart failure printed materials but she states with her vision it is hard  for her to read.  Instructed that I am available if she would have any questions.   Pricilla Riffle RN, CHFN

## 2020-05-22 NOTE — Progress Notes (Signed)
[place] for Heparin Infusion Indication: ACS/nSTEMI  Allergies  Allergen Reactions  . Chlorthalidone Anaphylaxis, Itching and Rash  . Dilaudid [Hydromorphone Hcl] Anaphylaxis    Hypersensitivity. 0.36m caused pt to become unresponsive  . Fentanyl Rash  . Midazolam Rash  . Morphine And Related Anaphylaxis    Pt unresponsive, apneic after 0.546mdilaudid   . Ace Inhibitors Other (See Comments)    Reaction:  Hyperkalemia, agitation   . Angiotensin Receptor Blockers Other (See Comments)    Reaction:  Hyperkalemia, agitation   . Metoclopramide Other (See Comments)  . Norvasc [Amlodipine] Itching and Rash  . Phenergan [Promethazine Hcl] Anxiety    "antsy, can't sit still"    Patient Measurements: Height: 4' 4"  (132.1 cm) Weight: 52.5 kg (115 lb 12.8 oz) IBW/kg (Calculated) : 27.1 Heparin Dosing Weight: 40kg Vital Signs: Temp: 97.6 F (36.4 C) (01/31 0418) Temp Source: Oral (01/31 0418) BP: 154/48 (01/31 0418) Pulse Rate: 55 (01/31 0418)  Labs: Recent Labs    05/20/20 0322 05/20/20 0429 05/20/20 0915 05/20/20 1144 05/20/20 1329 05/20/20 1344 05/21/20 0457 05/21/20 0940 05/21/20 2209 05/22/20 0850  HGB 8.5*  --   --   --   --   --  8.7*  --   --  8.0*  HCT 27.8*  --   --   --   --   --  28.2*  --   --  26.8*  PLT 138*  --   --   --   --   --  148*  --   --  157  APTT  --  32  --   --   --    < >  --  46* 42* 60*  LABPROT  --  16.3*  --   --   --   --   --   --   --   --   INR  --  1.4*  --   --   --   --   --   --   --   --   HEPARINUNFRC  --  2.78*  --   --   --   --   --  0.90*  --  0.46  CREATININE 3.85*  --   --   --   --   --  5.81*  --   --  7.85*  TROPONINIHS 598* 926* 1,697* 2,037* 2,084*  --   --   --   --   --    < > = values in this interval not displayed.    Estimated Creatinine Clearance: 4 mL/min (A) (by C-G formula based on SCr of 7.85 mg/dL (H)).   Medical History: Past Medical History:  Diagnosis Date   . (HFpEF) heart failure with preserved ejection fraction ([facility]   a. 01/2018 Echo: EF 50-55%, no rwma, Gr1 DD, triv AI, mild MR. Midly dil LA/RV, mod reduced RV fxn. Irreg thickening of TV w/ mobile echodensity that appears to arise from valve.  . Anemia   . Anorexia   . Bacteremia due to Pseudomonas   . Carotid arterial disease ([facility]   a. 01/2017 Carotid U/S: RICA <4<44LICA <4<03 . Marland KitchenHF (congestive heart failure) ([facility]  . Complication of anesthesia    a. Pt reports h/o complication on 9 different occasions - ? hypotension/arrest.  . 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 [facility], p-mLAD  50-60% (Resting FFR 0.75), mRCA 80-90%, ~40 Ost OM & D1-->CABG; b. 01/2016 Staged PCI of LCX x 2 and RCA.  . Depression 05/20/2020  . ESRD (end stage renal disease) on dialysis ([place])    a. ESRD secondary to acute kidney failure s/p CABG-->PD  . Essential hypertension   . GERD (gastroesophageal reflux disease)   . Heart murmur   . Hypercholesterolemia   . Myocardial infarction ([place]) 2016  . PVD (peripheral vascular disease) ([place])    a. 01/30/2018 PV Angio: Sev Left Popliteal dzs s/p PTA w/ 71m lutonix DEB w/ mech aspiration of the L popliteal, L AT, and Left tibioperoneal trunck and peroneal artery.  . 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  [facility]: Dr. BMarland KitchenHaithcock: CABG x 3, LIMA to LAD, SVG to RCA, SVG to OM3, EVH  . Sinusitis 2019  . Type II diabetes mellitus with complication (HCC)    CAD    Medications:  Apixaban 2.5 mg BID  Assessment: Pt is 70yo female ESRD on HD arriving via form local nsg home report BP of 70/44 following dialysis on 1/28.  Upon arrival SBP 137-196 [facility]  Goal of Therapy:  APTT level 66-102 Heparin level 0.3-0.7 units/ml Monitor platelets by anticoagulation protocol: Yes  Date Time HL/aPTT Rate/Comment 1/29 1344  38s  subtherapeutic 1/29 2331 2.78/40s Subtherapeutic & not  correlating 1/30 0940 0.90/46s  Subtherapeutic & not correlating 1/30  2209  42s  Subtherapeutic  1/31 0850 0.46/60s Subtherapeutic & beginning to correlate  Plan:  Very close to lower limit of aPTT goal now. Will increase rate w/o bolus. Increase heparin gtt to 1000 units/hr. APTT and HL starting to correlate. Recheck aPTT in 8 hours. CBC & HL with morning labs.  BLorna Dibble [facility]1/31/2022 10:44 AM

## 2020-05-22 NOTE — Progress Notes (Signed)
PROGRESS NOTE    Stephanie Ellis  POE:[id number] DOB: 1951/01/01 DOA: 05/20/2020 PCP: Pcp, No   Follow-up on non-STEMI Brief Narrative:  Stephanie Ellis a 70 y.o.femalewith medical history significant ofPAD status post bilateral BKA, A. fib on apixaban, cardiac arrest/PEA arrest felt to be secondary to Dilaudiduse,CAD, CABG, ESRD-HD, HTN, HLD, DM, stroke, PAD, depression, CHF with EF30-35%, anemia, seizure,who presents with hypotension. Upon arriving the hospital, she is diagnosed with non-STEMI with peak troponin of 2084.  She has been seen by cardiology.  Heparin drip started   Assessment & Plan:   Principal Problem:   NSTEMI (non-ST elevated myocardial infarction) ([place]) Active Problems:   ESRD (end stage renal disease) on dialysis ([place])   Hypertension   Hyperlipidemia   Anemia in ESRD (end-stage renal disease) (HCC)   Seizure (HCC)   Chronic combined systolic and diastolic CHF (congestive heart failure) (HCC)   Type II diabetes mellitus with renal manifestations (HCC)   Stroke (HCC)   CAD (coronary artery disease)   Depression   Prolonged QT interval   Hypotension   Nonsustained ventricular tachycardia (HCC)   Atrial fibrillation, chronic ([place])  #1.  Non-STEMI. Chronic atrial fibrillation. Chronic combined systolic and diastolic congestive heart failure. Patient condition stable, followed by cardiology.  We will continue heparin drip. If no heart cath tomorrow, patient be discharged home. Patient had a transient hypotension during dialysis, blood pressure has been well since admit to the hospital.  2.  End-stage renal disease. Followed by nephrology for HD.  3.  Essential hypertension. History of stroke. Continue home medicines per  4.  Type 2 diabetes. Continue current regimen.  5.  Chronic hypoxemic respiratory failure. Patient chronically on 2 L oxygen at home, stable.    DVT prophylaxis: Heparin drip Code Status: Full Family Communication:   Disposition Plan:  .   Status is: Inpatient  Remains inpatient appropriate because:Inpatient level of care appropriate due to severity of illness   Dispo: The patient is from: Home              Anticipated d/c is to: Home              Anticipated d/c date is: 1 day              Patient currently is not medically stable to d/c.   Difficult to place patient No        I/O last 3 completed shifts: In: 216.5 [I.V.:216.5] Out: -  Total I/O In: 240 [P.O.:240] Out: -      Consultants:   Cardiology and nephrology.  Procedures: None  Antimicrobials: None  Subjective: Patient doing well today.  She does not have any chest pain shortness of breath. No abdominal pain or nausea vomiting. No dysuria hematuria No fever or chills.  Objective: Vitals:   05/21/20 1336 05/21/20 1644 05/21/20 2034 05/22/20 0418  BP: (!) 140/51 (!) 117/46 (!) 179/49 (!) 154/48  Pulse: 61  61 (!) 55  Resp:   17 17  Temp:   98.2 F (36.8 C) 97.6 F (36.4 C)  TempSrc:   Axillary Oral  SpO2:   100% 100%  Weight:    52.5 kg  Height:        Intake/Output Summary (Last 24 hours) at 05/22/2020 1059 Last data filed at 05/22/2020 1013 Gross per 24 hour  Intake 283.77 ml  Output --  Net 283.77 ml   Filed Weights   05/20/20 0044 05/21/20 0431 05/22/20 0418  Weight: 54.4 kg 51.4 kg  52.5 kg    Examination:  General exam: Appears calm and comfortable  Respiratory system: Clear to auscultation. Respiratory effort normal. Cardiovascular system: S1 & S2 heard, RRR. No JVD, murmurs, rubs, gallops or clicks. No pedal edema. Gastrointestinal system: Abdomen is nondistended, soft and nontender. No organomegaly or masses felt. Normal bowel sounds heard. Central nervous system: Alert and oriented. No focal neurological deficits. Extremities: Bilateral BKA Skin: No rashes, lesions or ulcers Psychiatry:  Mood & affect appropriate.     Data Reviewed: I have personally reviewed following labs and  imaging studies  CBC: Recent Labs  Lab 05/20/20 0322 05/21/20 0457 05/22/20 0850  WBC 5.5 5.8 5.9  NEUTROABS  --   --  4.7  HGB 8.5* 8.7* 8.0*  HCT 27.8* 28.2* 26.8*  MCV 98.9 98.9 99.3  PLT 138* 148* 295   Basic Metabolic Panel: Recent Labs  Lab 05/16/20 0605 05/20/20 0322 05/20/20 0429 05/21/20 0457 05/22/20 0850  NA 139 139  --  138 138  K 3.6 4.5  --  3.1* 4.0  CL 98 95*  --  99 98  CO2 27 29  --  28 28  GLUCOSE 135* 156*  --  187* 181*  BUN 24* 17  --  23 33*  CREATININE 5.24* 3.85*  --  5.81* 7.85*  CALCIUM 7.8* 8.4*  --  8.0* 8.3*  MG  --   --  2.0  --  2.0   GFR: Estimated Creatinine Clearance: 4 mL/min (A) (by C-G formula based on SCr of 7.85 mg/dL (H)). Liver Function Tests: No results for input(s): AST, ALT, ALKPHOS, BILITOT, PROT, ALBUMIN in the last 168 hours. No results for input(s): LIPASE, AMYLASE in the last 168 hours. No results for input(s): AMMONIA in the last 168 hours. Coagulation Profile: Recent Labs  Lab 05/20/20 0429  INR 1.4*   Cardiac Enzymes: No results for input(s): CKTOTAL, CKMB, CKMBINDEX, TROPONINI in the last 168 hours. BNP (last 3 results) No results for input(s): PROBNP in the last 8760 hours. HbA1C: Recent Labs    05/21/20 0457  HGBA1C 7.4*   CBG: Recent Labs  Lab 05/21/20 1002 05/21/20 1328 05/21/20 1648 05/21/20 2033 05/22/20 0826  GLUCAP 216* 113* 115* 184* 163*   Lipid Profile: Recent Labs    05/21/20 0457  CHOL 73  HDL 37*  LDLCALC 24  TRIG 61  CHOLHDL 2.0   Thyroid Function Tests: No results for input(s): TSH, T4TOTAL, FREET4, T3FREE, THYROIDAB in the last 72 hours. Anemia Panel: No results for input(s): VITAMINB12, FOLATE, FERRITIN, TIBC, IRON, RETICCTPCT in the last 72 hours. Sepsis Labs: No results for input(s): PROCALCITON, LATICACIDVEN in the last 168 hours.  Recent Results (from the past 240 hour(s))  SARS Coronavirus 2 by RT PCR (hospital order, performed in [facility] hospital lab)  Nasopharyngeal Nasopharyngeal Swab     Status: None   Collection Time: 05/13/20  6:08 PM   Specimen: Nasopharyngeal Swab  Result Value Ref Range Status   SARS Coronavirus 2 NEGATIVE NEGATIVE Final    Comment: (NOTE) SARS-CoV-2 target nucleic acids are NOT DETECTED.  The SARS-CoV-2 RNA is generally detectable in upper and lower respiratory specimens during the acute phase of infection. The lowest concentration of SARS-CoV-2 viral copies this assay can detect is 250 copies / mL. A negative result does not preclude SARS-CoV-2 infection and should not be used as the sole basis for treatment or other patient management decisions.  A negative result may occur with improper specimen collection / handling,  submission of specimen other than nasopharyngeal swab, presence of viral mutation(s) within the areas targeted by this assay, and inadequate number of viral copies (<250 copies / mL). A negative result must be combined with clinical observations, patient history, and epidemiological information.  Fact Sheet for Patients:   StrictlyIdeas.no  Fact Sheet for Healthcare Providers: BankingDealers.co.za  This test is not yet approved or  cleared by the [place] FDA and has been authorized for detection and/or diagnosis of SARS-CoV-2 by FDA under an Emergency Use Authorization (EUA).  This EUA will remain in effect (meaning this test can be used) for the duration of the COVID-19 declaration under Section 564(b)(1) of the Act, 21 U.S.C. section 360bbb-3(b)(1), unless the authorization is terminated or revoked sooner.  Performed at [facility], [address] [place], [place] [address]   SARS Coronavirus 2 by RT PCR (hospital order, performed in [facility] hospital lab) Nasopharyngeal Nasopharyngeal Swab     Status: None   Collection Time: 05/20/20  5:00 AM   Specimen: Nasopharyngeal Swab  Result Value Ref Range Status   SARS  Coronavirus 2 NEGATIVE NEGATIVE Final    Comment: (NOTE) SARS-CoV-2 target nucleic acids are NOT DETECTED.  The SARS-CoV-2 RNA is generally detectable in upper and lower respiratory specimens during the acute phase of infection. The lowest concentration of SARS-CoV-2 viral copies this assay can detect is 250 copies / mL. A negative result does not preclude SARS-CoV-2 infection and should not be used as the sole basis for treatment or other patient management decisions.  A negative result may occur with improper specimen collection / handling, submission of specimen other than nasopharyngeal swab, presence of viral mutation(s) within the areas targeted by this assay, and inadequate number of viral copies (<250 copies / mL). A negative result must be combined with clinical observations, patient history, and epidemiological information.  Fact Sheet for Patients:   StrictlyIdeas.no  Fact Sheet for Healthcare Providers: BankingDealers.co.za  This test is not yet approved or  cleared by the [place] FDA and has been authorized for detection and/or diagnosis of SARS-CoV-2 by FDA under an Emergency Use Authorization (EUA).  This EUA will remain in effect (meaning this test can be used) for the duration of the COVID-19 declaration under Section 564(b)(1) of the Act, 21 U.S.C. section 360bbb-3(b)(1), unless the authorization is terminated or revoked sooner.  Performed at [facility], [address], [place], [place] [address]          Radiology Studies: No results found.      Scheduled Meds: . aspirin EC  81 mg Oral Daily  . atorvastatin  80 mg Oral QHS  . brimonidine  1 drop Both Eyes TID  . carvedilol  25 mg Oral BID WC  . Chlorhexidine Gluconate Cloth  6 each Topical Q0600  . epoetin (EPOGEN/PROCRIT) injection  6,000 Units Subcutaneous Q M,W,F-HD  . fluticasone  2 spray Each Nare Daily  . hydrALAZINE  75 mg  Oral Q8H  . insulin aspart  0-5 Units Subcutaneous QHS  . insulin aspart  0-9 Units Subcutaneous TID WC  . ipratropium  2 spray Each Nare BID  . isosorbide mononitrate  60 mg Oral Daily  . levETIRAcetam  250 mg Oral Q M,W,F  . levETIRAcetam  500 mg Oral BID  . losartan  50 mg Oral Daily  . omega-3 acid ethyl esters  2 g Oral Daily  . pantoprazole  40 mg Oral Daily  . polyvinyl alcohol  1 drop  Both Eyes BID  . senna-docusate  1 tablet Oral BID  . sevelamer carbonate  1,600 mg Oral TID WC  . thiamine  100 mg Oral Daily  . timolol  1 drop Both Eyes TID  . vitamin B-12  1,000 mcg Oral Daily   Continuous Infusions: . sodium chloride    . sodium chloride    . heparin 1,000 Units/hr (05/22/20 1056)     LOS: 2 days    Time spent: 28 minutes    Sharen Hones, MD [facility]   To contact the attending provider between 7A-7P or the covering provider during after hours 7P-7A, please log into the web site [email or web address] and access using universal  password for that web site. If you do not have the password, please call the hospital operator.  05/22/2020, 10:59 AM

## 2020-05-22 NOTE — Progress Notes (Signed)
[facility] Cardiology    SUBJECTIVE: Patient states to be doing reasonably well now sitting up eating denies any pain no worsening shortness of breath feels well enough to be discharged   Vitals:   05/21/20 1644 05/21/20 2034 05/22/20 0418 05/22/20 1145  BP: (!) 117/46 (!) 179/49 (!) 154/48 (!) 161/56  Pulse:  61 (!) 55 63  Resp:  17 17 16   Temp:  98.2 F (36.8 C) 97.6 F (36.4 C) 98.4 F (36.9 C)  TempSrc:  Axillary Oral Oral  SpO2:  100% 100% 99%  Weight:   52.5 kg   Height:         Intake/Output Summary (Last 24 hours) at 05/22/2020 1417 Last data filed at 05/22/2020 1013 Gross per 24 hour  Intake 283.77 ml  Output -  Net 283.77 ml      PHYSICAL EXAM  General: Well developed, well nourished, in no acute distress HEENT:  Normocephalic and atramatic Neck:  No JVD.  Lungs: Clear bilaterally to auscultation and percussion. Heart: HRRR . Normal S1 and S2 without gallops or murmurs.  Abdomen: Bowel sounds are positive, abdomen soft and non-tender  Msk:  Back normal, normal gait. Normal strength and tone for age. Extremities: Bilateral BKA Neuro: Alert and oriented X 3. Psych:  Good affect, responds appropriately   LABS: Basic Metabolic Panel: Recent Labs    05/20/20 0429 05/21/20 0457 05/22/20 0850  NA  --  138 138  K  --  3.1* 4.0  CL  --  99 98  CO2  --  28 28  GLUCOSE  --  187* 181*  BUN  --  23 33*  CREATININE  --  5.81* 7.85*  CALCIUM  --  8.0* 8.3*  MG 2.0  --  2.0   Liver Function Tests: No results for input(s): AST, ALT, ALKPHOS, BILITOT, PROT, ALBUMIN in the last 72 hours. No results for input(s): LIPASE, AMYLASE in the last 72 hours. CBC: Recent Labs    05/21/20 0457 05/22/20 0850  WBC 5.8 5.9  NEUTROABS  --  4.7  HGB 8.7* 8.0*  HCT 28.2* 26.8*  MCV 98.9 99.3  PLT 148* 157   Cardiac Enzymes: No results for input(s): CKTOTAL, CKMB, CKMBINDEX, TROPONINI in the last 72 hours. BNP: Invalid input(s): POCBNP D-Dimer: No results for input(s):  DDIMER in the last 72 hours. Hemoglobin A1C: Recent Labs    05/21/20 0457  HGBA1C 7.4*   Fasting Lipid Panel: Recent Labs    05/21/20 0457  CHOL 73  HDL 37*  LDLCALC 24  TRIG 61  CHOLHDL 2.0   Thyroid Function Tests: No results for input(s): TSH, T4TOTAL, T3FREE, THYROIDAB in the last 72 hours.  Invalid input(s): FREET3 Anemia Panel: No results for input(s): VITAMINB12, FOLATE, FERRITIN, TIBC, IRON, RETICCTPCT in the last 72 hours.  No results found.   Echo depressed left ventricular function around 30%  TELEMETRY: Normal sinus rhythm rate of 65 bundle branch block:  ASSESSMENT AND PLAN:  Principal Problem:   NSTEMI (non-ST elevated myocardial infarction) ([place]) Active Problems:   ESRD (end stage renal disease) on dialysis ([place])   Hypertension   Hyperlipidemia   Anemia in ESRD (end-stage renal disease) (HCC)   Seizure (HCC)   Chronic combined systolic and diastolic CHF (congestive heart failure) (HCC)   Type II diabetes mellitus with renal manifestations (HCC)   Stroke (HCC)   CAD (coronary artery disease)   Depression   Prolonged QT interval   Hypotension   Nonsustained ventricular tachycardia ([place])  Atrial fibrillation, chronic (HCC)    Plan Discontinue heparin therapy Continue dialysis therapy for end-stage renal disease Maintain blood pressure control Probable non-STEMI but will treat medically Continue seizure disorder therapy Continue therapy for cardiomyopathy heart failure Agree with diabetes management and control Anticoagulation for atrial fibrillation Recommend conservative medical therapy follow-up cardiology note patient   Yolonda Kida, MD 05/22/2020 2:17 PM

## 2020-05-22 NOTE — TOC Progression Note (Signed)
Transition of Care [facility]) - Progression Note    Patient Details  Name: Stephanie Ellis MRN: [id number] Date of Birth: 1950-09-28  Transition of Care [facility]) CM/SW [place], RN Phone Number: 05/22/2020, 1:06 PM  Clinical Narrative:   Called patient's Daughter, [name], who wants patient to return to [facility] when medically stable. Called the facility spoke with Jonelle Sidle, who confirms that patient can return to [facility] room 318.         Expected Discharge Plan and Services                                                 Social Determinants of Health (SDOH) Interventions    Readmission Risk Interventions Readmission Risk Prevention Plan 04/11/2020 05/04/2019  Transportation Screening Complete Complete  PCP or Specialist Appt within 3-5 Days - Complete  Palliative Care Screening - Not Applicable  Medication Review ([place]) Complete Complete  PCP or Specialist appointment within 3-5 days of discharge Complete -  [place] or Home Care Consult Complete -  SW Recovery Care/Counseling Consult Complete -  Palliative Care Screening Not Applicable -  Skilled Nursing Facility Complete -  Some recent data might be hidden

## 2020-05-23 DIAGNOSIS — N186 End stage renal disease: Secondary | ICD-10-CM | POA: Diagnosis not present

## 2020-05-23 DIAGNOSIS — I482 Chronic atrial fibrillation, unspecified: Secondary | ICD-10-CM | POA: Diagnosis not present

## 2020-05-23 DIAGNOSIS — I5042 Chronic combined systolic (congestive) and diastolic (congestive) heart failure: Secondary | ICD-10-CM | POA: Diagnosis not present

## 2020-05-23 DIAGNOSIS — I214 Non-ST elevation (NSTEMI) myocardial infarction: Secondary | ICD-10-CM | POA: Diagnosis not present

## 2020-05-23 LAB — BASIC METABOLIC PANEL
Anion gap: 12 (ref 5–15)
BUN: 17 mg/dL (ref 8–23)
CO2: 30 mmol/L (ref 22–32)
Calcium: 8.7 mg/dL — ABNORMAL LOW (ref 8.9–10.3)
Chloride: 95 mmol/L — ABNORMAL LOW (ref 98–111)
Creatinine, Ser: 4.7 mg/dL — ABNORMAL HIGH (ref 0.44–1.00)
GFR, Estimated: 10 mL/min — ABNORMAL LOW (ref >60)
Glucose, Bld: 151 mg/dL — ABNORMAL HIGH (ref 70–99)
Potassium: 3.6 mmol/L (ref 3.5–5.1)
Sodium: 137 mmol/L (ref 135–145)

## 2020-05-23 LAB — CBC
HCT: 27.9 % — ABNORMAL LOW (ref 36.0–46.0)
Hemoglobin: 8.4 g/dL — ABNORMAL LOW (ref 12.0–15.0)
MCH: 30.1 pg (ref 26.0–34.0)
MCHC: 30.1 g/dL (ref 30.0–36.0)
MCV: 100 fL (ref 80.0–100.0)
Platelets: 161 10*3/uL (ref 150–400)
RBC: 2.79 MIL/uL — ABNORMAL LOW (ref 3.87–5.11)
RDW: 18 % — ABNORMAL HIGH (ref 11.5–15.5)
WBC: 6.1 10*3/uL (ref 4.0–10.5)
nRBC: 0 % (ref 0.0–0.2)

## 2020-05-23 LAB — SARS CORONAVIRUS 2 BY RT PCR (HOSPITAL ORDER, PERFORMED IN ~~LOC~~ HOSPITAL LAB): SARS Coronavirus 2: NEGATIVE

## 2020-05-23 LAB — GLUCOSE, CAPILLARY: Glucose-Capillary: 150 mg/dL — ABNORMAL HIGH (ref 70–99)

## 2020-05-23 NOTE — TOC Transition Note (Signed)
Transition of Care [facility] - CM/SW Discharge Note   Patient Details  Name: Stephanie Ellis MRN: [id number] Date of Birth: 11-Apr-1951  Transition of Care [facility]) CM/SW Contact:  Kerin Salen, RN Phone Number: 05/23/2020, 9:41 AM   Clinical Narrative:  Patient to be discharged today and return to [facility]. Family notified. [facility] will provide transportation. Discharge summary sent electronically to facility.     Final next level of care: Skilled Nursing Facility Barriers to Discharge: Barriers Resolved   Patient Goals and CMS Choice Patient states their goals for this hospitalization and ongoing recovery are:: Per Daughter to return to [facility]   Choice offered to / list presented to : NA  Discharge Placement                  Name of family member notified: Conchita Paris Patient and family notified of of transfer: 05/22/20  Discharge Plan and Services                DME Arranged: N/A DME Agency: NA       HH Arranged: NA [place] Agency: NA        Social Determinants of Health (SDOH) Interventions     Readmission Risk Interventions Readmission Risk Prevention Plan 04/11/2020 05/04/2019  Transportation Screening Complete Complete  PCP or Specialist Appt within 3-5 Days - Complete  Palliative Care Screening - Not Applicable  Medication Review (RN Care Manager) Complete Complete  PCP or Specialist appointment within 3-5 days of discharge Complete -  [place] or Home Care Consult Complete -  SW Recovery Care/Counseling Consult Complete -  Palliative Care Screening Not Applicable -  Skilled Nursing Facility Complete -  Some recent data might be hidden

## 2020-05-23 NOTE — Care Management Important Message (Signed)
Important Message  Patient Details  Name: Stephanie Ellis MRN: [id number] Date of Birth: 1950-12-24   Medicare Important Message Given:  Yes     Dannette Barbara 05/23/2020, 10:56 AM

## 2020-05-23 NOTE — Discharge Summary (Signed)
Physician Discharge Summary  Patient ID: Stephanie Ellis MRN: [id number] DOB/AGE: 1951/04/20 70 y.o.  Admit date: 05/20/2020 Discharge date: 05/23/2020  Admission Diagnoses:  Discharge Diagnoses:  Principal Problem:   NSTEMI (non-ST elevated myocardial infarction) [facility]) Active Problems:   ESRD (end stage renal disease) on dialysis ([place])   Hypertension   Hyperlipidemia   Anemia in ESRD (end-stage renal disease) ([place])   Seizure (HCC)   Chronic combined systolic and diastolic CHF (congestive heart failure) (HCC)   Type II diabetes mellitus with renal manifestations (HCC)   Stroke (HCC)   CAD (coronary artery disease)   Depression   Prolonged QT interval   Hypotension   Nonsustained ventricular tachycardia (HCC)   Atrial fibrillation, chronic ([place])   Discharged Condition: good  Hospital Course:  Stephanie Ellis a 55 y.o.femalewith medical history significant ofPAD status post bilateral BKA, A. fib on apixaban, cardiac arrest/PEA arrest felt to be secondary to Dilaudiduse,CAD, CABG, ESRD-HD, HTN, HLD, DM, stroke, PAD, depression, CHF with EF30-35%, anemia, seizure,who presents with hypotension. Upon arriving the hospital, she is diagnosed with non-STEMI with peak troponin of 2084. She has been seen by cardiology. Heparin drip started.   #1. Non-STEMI. Chronic atrial fibrillation. Chronic combined systolic and diastolic congestive heart failure. Transient hypotension Patient has been seen by cardiology, received a 48 hours of heparin drip.  Patient does not have any chest pain, no work-up indicated per cardiology.  Continue medical treatment. Patient had a transient hypotension during dialysis, blood pressure has been well since admit to the hospital.  2.  End-stage renal disease. Followed by nephrology for HD.  3.  Essential hypertension. History of stroke. Continue home medicines  4.  Type 2 diabetes. Resume home regimen  5.  Chronic hypoxemic respiratory  failure. Patient chronically on 2 L oxygen at home, stable.     Consults: cardiology and nephrology  Significant Diagnostic Studies:  1. Left ventricular ejection fraction, by estimation, is 30 to 35%. The left ventricle has severely decreased function. The left ventricle demonstrates global hypokinesis. The left ventricular internal cavity size was moderately dilated. There is severe concentric left ventricular hypertrophy. Left ventricular diastolic parameters are consistent with Grade III diastolic dysfunction (restrictive). 2. Right ventricular systolic function is severely reduced. The right ventricular size is severely enlarged. Severely increased right ventricular wall thickness. 3. Left atrial size was severely dilated. 4. Right atrial size was severely dilated. 5. The pericardial effusion is circumferential. 6. The mitral valve is degenerative. Mild mitral valve regurgitation. Severe mitral annular calcification. 7. The tricuspid valve is degenerative. 8. The aortic valve is calcified. Aortic valve regurgitation is mild to moderate. Mild to moderate aortic valve sclerosis/calcification is present, without any evidence of aortic stenosis.   Treatments: Heparin drip  Discharge Exam: Blood pressure (!) 147/48, pulse 63, temperature (!) 97.4 F (36.3 C), temperature source Oral, resp. rate 18, height 4' 4"  (1.321 m), weight 52.6 kg, SpO2 94 %. General appearance: alert and cooperative Resp: clear to auscultation bilaterally Cardio: regular rate and rhythm, S1, S2 normal, no murmur, click, rub or gallop GI: soft, non-tender; bowel sounds normal; no masses,  no organomegaly Extremities: Bilateral BKA  Disposition: Discharge disposition: 03-Skilled Nursing Facility       Discharge Instructions    Diet general   Complete by: As directed    Renal diet, with fluid 1219m/day   Increase activity slowly   Complete by: As directed      Allergies as of 05/23/2020       Reactions  Chlorthalidone Anaphylaxis, Itching, Rash   Dilaudid [hydromorphone Hcl] Anaphylaxis   Hypersensitivity. 0.86m caused pt to become unresponsive   Fentanyl Rash   Midazolam Rash   Morphine And Related Anaphylaxis   Pt unresponsive, apneic after 0.556mdilaudid    Ace Inhibitors Other (See Comments)   Reaction:  Hyperkalemia, agitation    Angiotensin Receptor Blockers Other (See Comments)   Reaction:  Hyperkalemia, agitation    Metoclopramide Other (See Comments)   Norvasc [amlodipine] Itching, Rash   Phenergan [promethazine Hcl] Anxiety   "antsy, can't sit still"      Medication List    TAKE these medications   apixaban 2.5 MG Tabs tablet Commonly known as: ELIQUIS Take 1 tablet (2.5 mg total) by mouth 2 (two) times daily.   aspirin EC 81 MG tablet Take 1 tablet (81 mg total) by mouth daily.   atorvastatin 80 MG tablet Commonly known as: LIPITOR Take 80 mg by mouth at bedtime.   brimonidine 0.2 % ophthalmic solution Commonly known as: ALPHAGAN Place 1 drop into both eyes 3 (three) times daily.   carvedilol 25 MG tablet Commonly known as: COREG Take 1 tablet (25 mg total) by mouth 2 (two) times daily. What changed: when to take this   fluticasone 50 MCG/ACT nasal spray Commonly known as: FLONASE Place 2 sprays into both nostrils daily.   hydrALAZINE 25 MG tablet Commonly known as: APRESOLINE Take 3 tablets (75 mg total) by mouth every 8 (eight) hours.   insulin aspart 100 UNIT/ML injection Commonly known as: novoLOG Inject 0-6 Units into the skin See admin instructions. Inject under the skin according to blood glucose reading:  <201: 0u 201- 250: 2u 251-300: 4u 301-350: 5u 351-400: 6u   insulin lispro 100 UNIT/ML KwikPen Commonly known as: HUMALOG Inject 2-6 Units into the skin 3 (three) times daily before meals.   ipratropium 0.06 % nasal spray Commonly known as: ATROVENT Place 2 sprays into both nostrils 2 (two) times daily.   isosorbide  mononitrate 60 MG 24 hr tablet Commonly known as: IMDUR Take 1 tablet (60 mg total) by mouth daily.   levETIRAcetam 250 MG tablet Commonly known as: KEPPRA Three times weekly after dialysis What changed:   how much to take  how to take this  when to take this  additional instructions   levETIRAcetam 500 MG tablet Commonly known as: KEPPRA Take 500 mg by mouth 2 (two) times daily. What changed: Another medication with the same name was changed. Make sure you understand how and when to take each.   losartan 50 MG tablet Commonly known as: COZAAR Take 1 tablet (50 mg total) by mouth daily.   meclizine 25 MG tablet Commonly known as: ANTIVERT Take 25 mg by mouth every 4 (four) hours as needed for dizziness.   mirtazapine 15 MG tablet Commonly known as: REMERON Take 7.5 mg by mouth at bedtime.   Omega-3 1000 MG Caps Take 2 capsules by mouth daily.   pantoprazole 40 MG tablet Commonly known as: PROTONIX Take 1 tablet (40 mg total) by mouth daily.   Refresh 1.4-0.6 % Soln Generic drug: Polyvinyl Alcohol-Povidone PF Place 1 drop into both eyes 2 (two) times a day.   rOPINIRole 0.25 MG tablet Commonly known as: REQUIP Take 0.25 mg by mouth at bedtime.   senna-docusate 8.6-50 MG tablet Commonly known as: Senokot-S Take 1 tablet by mouth 2 (two) times daily.   sevelamer carbonate 800 MG tablet Commonly known as: RENVELA 800 mg 3 (three) times  daily with meals. 2 Tablet(s) By Mouth 3 Times Daily   thiamine 100 MG tablet Take 1 tablet (100 mg total) by mouth daily.   timolol 0.5 % ophthalmic solution Commonly known as: TIMOPTIC Place 1 drop into both eyes 3 (three) times daily.   triamcinolone 0.1 % Commonly known as: KENALOG Apply 1 application topically daily as needed (dry skin).   vitamin B-12 1000 MCG tablet Commonly known as: CYANOCOBALAMIN Take 1 tablet (1,000 mcg total) by mouth daily.        Signed: Sharen Hones 05/23/2020, 9:27 AM

## 2020-05-28 IMAGING — DX DG CHEST 1V PORT
1 series · 1 of 1 positions shown · non-contrast
Comparison: 10/15/2017

CLINICAL DATA: Hemoptysis

EXAM:
PORTABLE CHEST 1 VIEW

[chest ap]
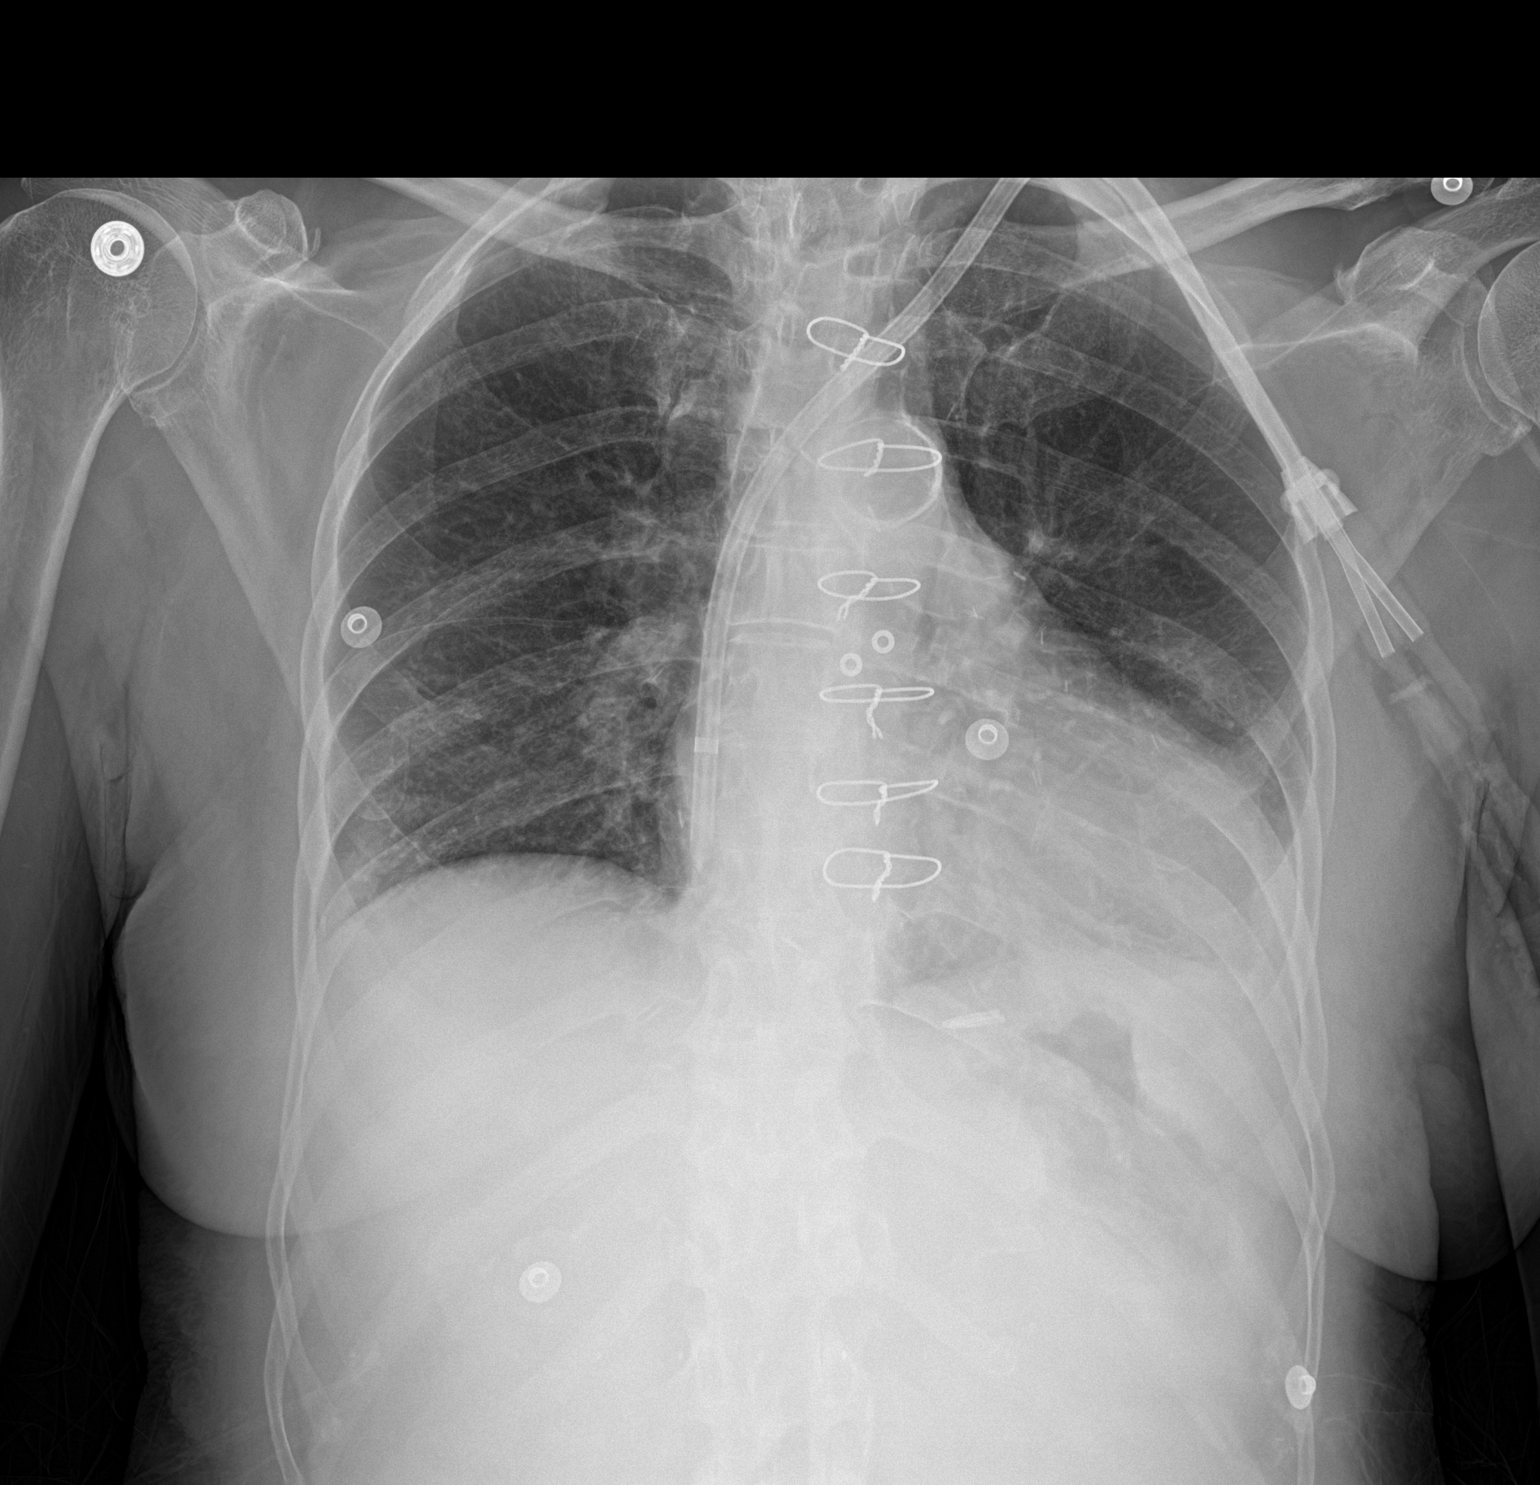

[1 of 1 positions shown; findings below may reference images not displayed]

FINDINGS: Left IJ dialysis catheter tips proximal mid right atrium. Stable
cardiomegaly without CHF. Chronic left basilar scarring as before
with blunting of left costophrenic angle. No definite superimposed
acute process or CHF. No pneumothorax. Trachea is midline. Previous
median sternotomy noted. Aorta atherosclerotic. Degenerative changes
noted of the spine.
IMPRESSION: Stable cardiomegaly and left basilar scarring.

No interval change or superimposed acute process.

## 2020-06-04 IMAGING — DX DG FOOT COMPLETE 3+V*R*
3 series · 3 of 3 positions shown · non-contrast
Comparison: None.

CLINICAL DATA: Bilateral pedal gangrene. History of left great toe
amputation 4 days ago. Diabetes and end-stage renal disease.

EXAM:
RIGHT FOOT COMPLETE - 3+ VIEW

[foot ap]
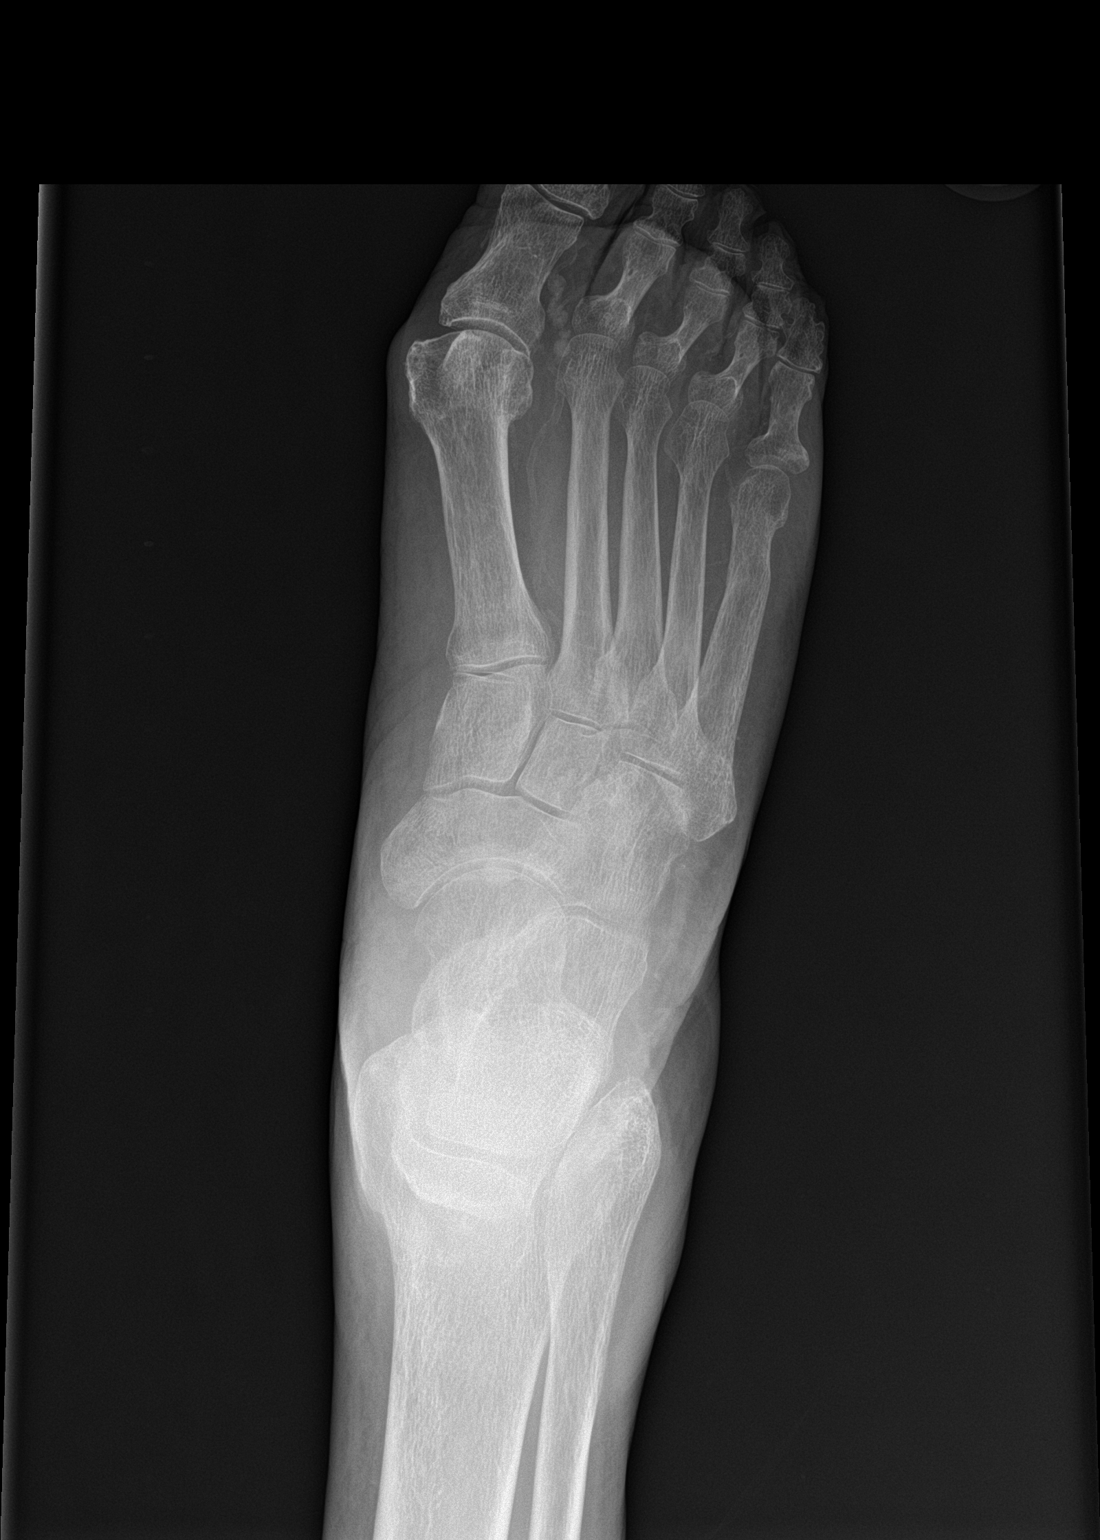

[foot obl]
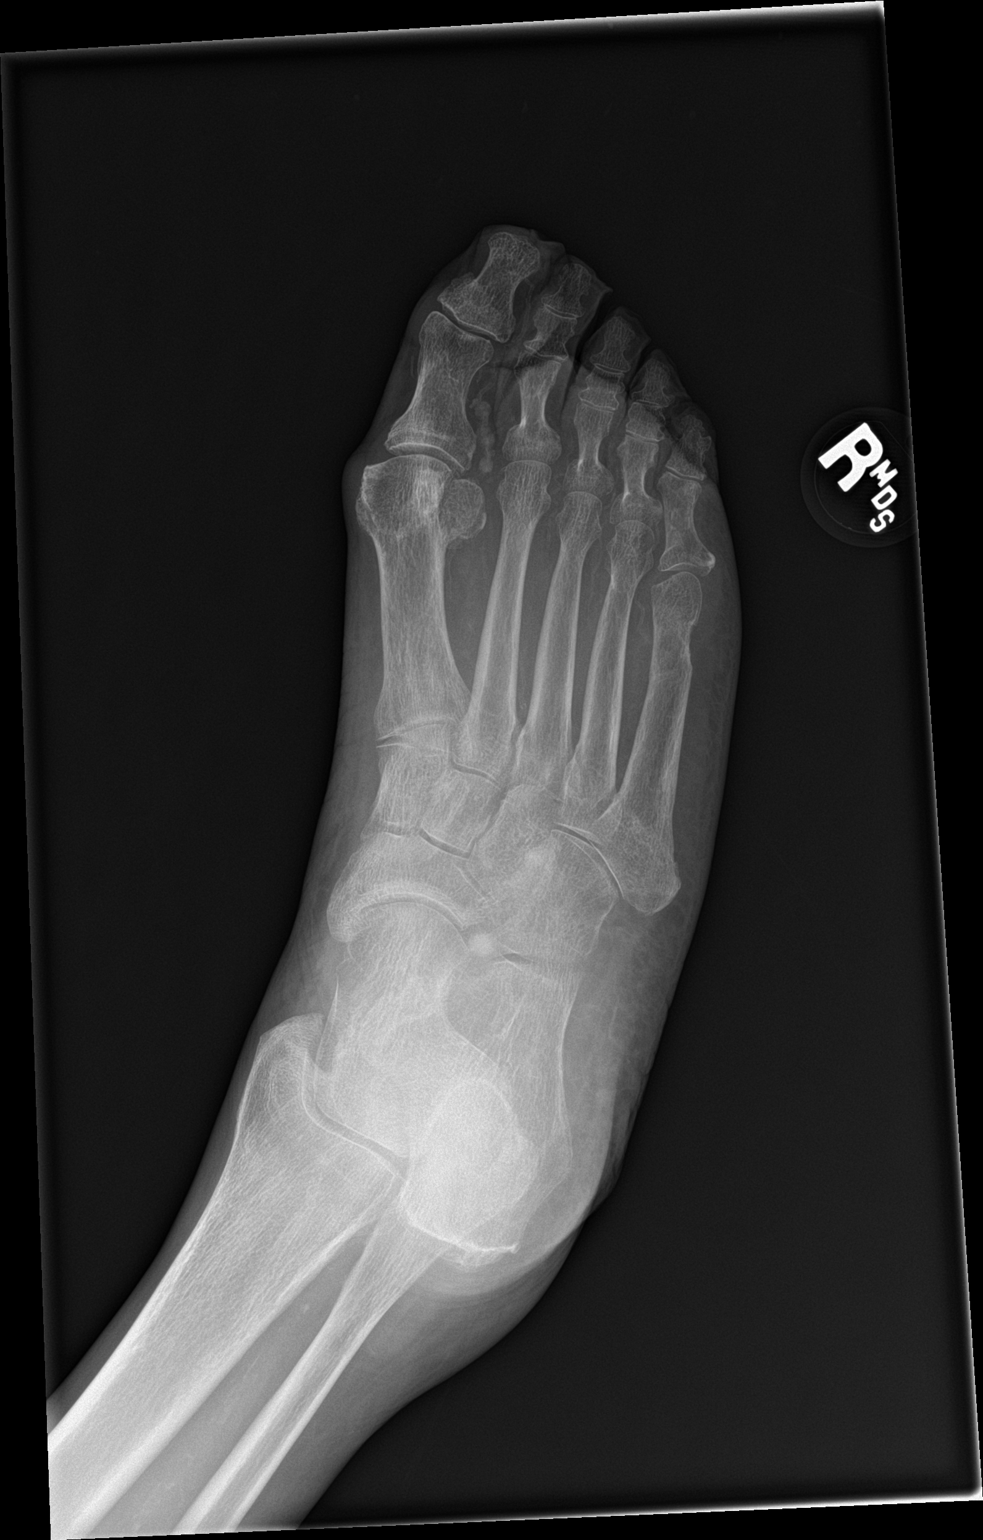

[foot lat]
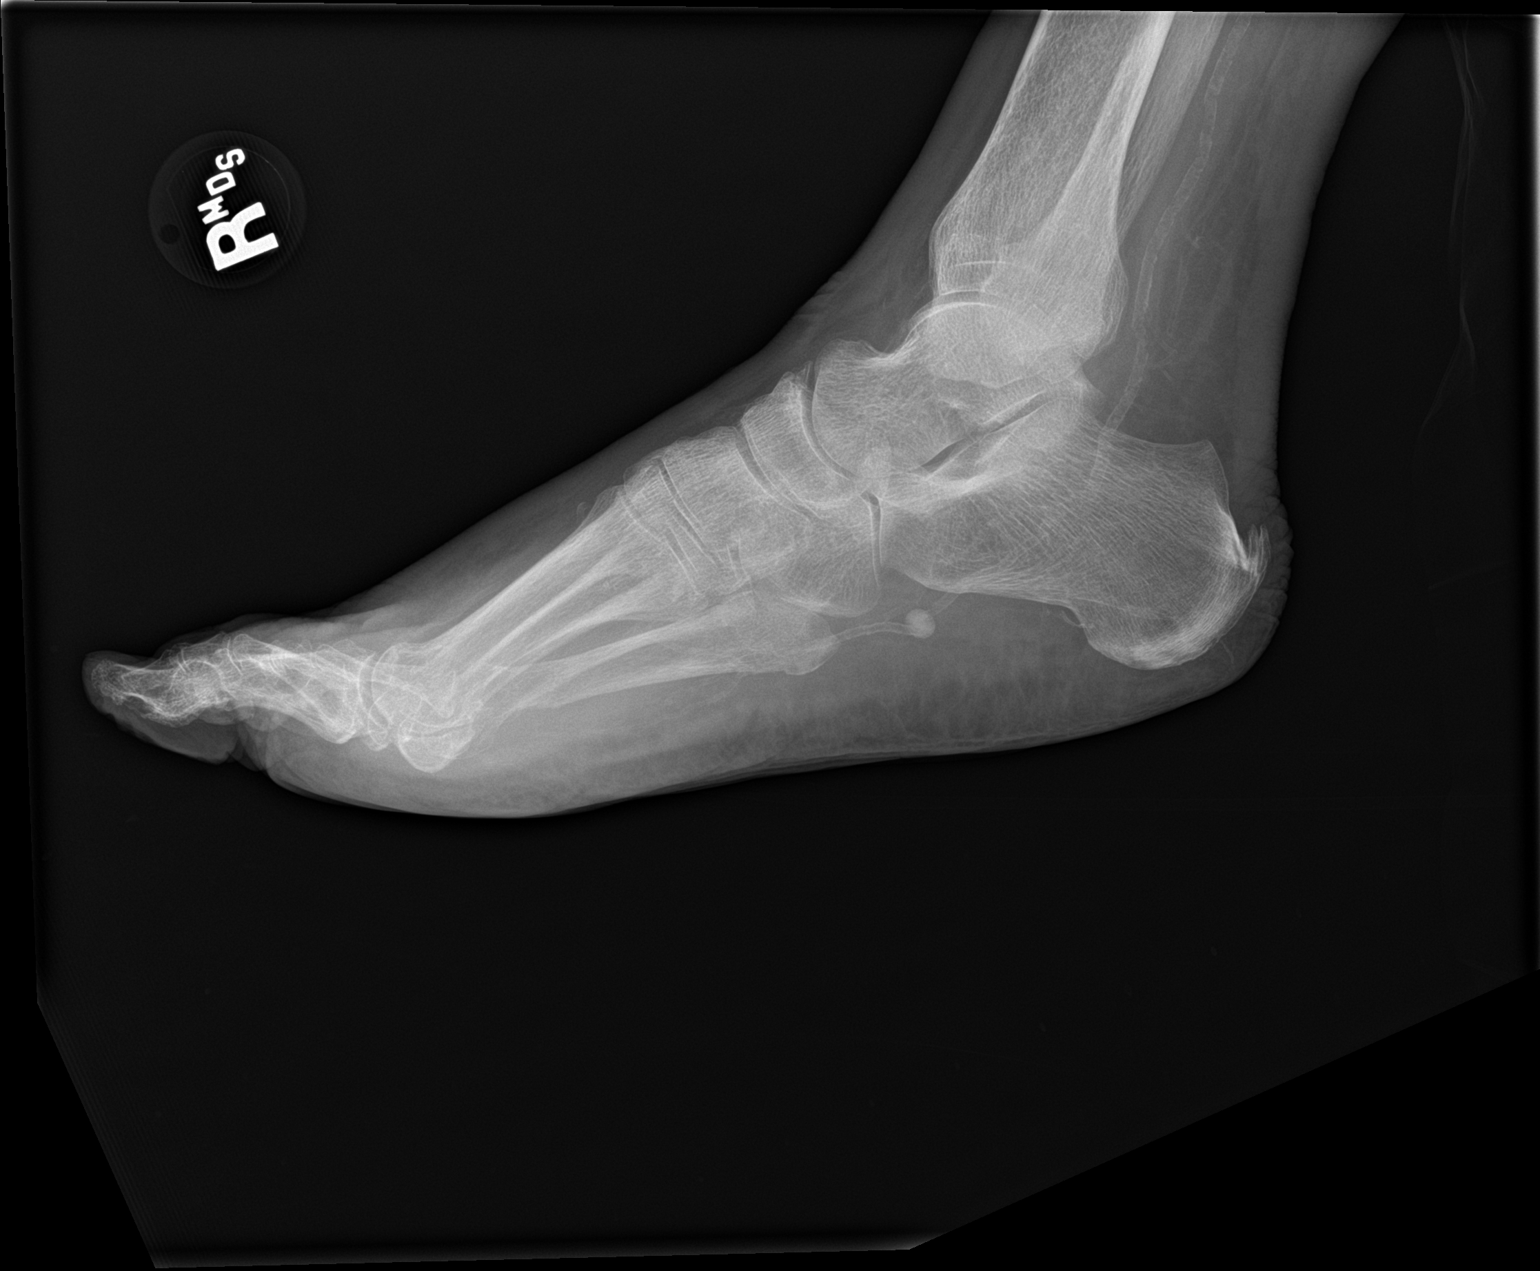

[3 of 3 positions shown; findings below may reference images not displayed]

FINDINGS: The bones appear demineralized. There is no evidence of acute
fracture, dislocation or bone destruction. There is an old healed
fracture of the distal 5th metatarsal shaft. There is moderate joint
space narrowing, osteophyte formation and mild hallux valgus
deformity of the 1st MTP joint. There are lobulated calcifications
within the 1st web space which could reflect hydroxyapatite
deposition. Prominent vascular calcifications are noted. There is an
additional lobular calcification in the plantar aspect of the
hindfoot adjacent to the plantar artery which could reflect a small
aneurysm.
IMPRESSION: 1. No acute osseous findings or radiographic evidence of
osteomyelitis.
2. Possible small aneurysm of the plantar artery. Additional
calcification in the 1st web space could be vascular or
hydroxyapatite deposition.

## 2022-03-15 ENCOUNTER — Emergency Department
Admission: EM | Admit: 2022-03-15 | Discharge: 2022-03-22 | Disposition: E | Payer: Medicare Other | Attending: Emergency Medicine | Admitting: Emergency Medicine

## 2022-03-15 DIAGNOSIS — R799 Abnormal finding of blood chemistry, unspecified: Secondary | ICD-10-CM | POA: Insufficient documentation

## 2022-03-15 DIAGNOSIS — N186 End stage renal disease: Secondary | ICD-10-CM | POA: Diagnosis not present

## 2022-03-15 DIAGNOSIS — Z992 Dependence on renal dialysis: Secondary | ICD-10-CM | POA: Insufficient documentation

## 2022-03-15 DIAGNOSIS — I469 Cardiac arrest, cause unspecified: Secondary | ICD-10-CM | POA: Diagnosis present

## 2022-03-15 LAB — CBC WITH DIFFERENTIAL/PLATELET
Abs Immature Granulocytes: 0.36 10*3/uL — ABNORMAL HIGH (ref 0.00–0.07)
Basophils Absolute: 0 10*3/uL (ref 0.0–0.1)
Basophils Relative: 0 %
Eosinophils Absolute: 0.1 10*3/uL (ref 0.0–0.5)
Eosinophils Relative: 1 %
HCT: 26.9 % — ABNORMAL LOW (ref 36.0–46.0)
Hemoglobin: 8 g/dL — ABNORMAL LOW (ref 12.0–15.0)
Immature Granulocytes: 4 %
Lymphocytes Relative: 45 %
Lymphs Abs: 4.6 10*3/uL — ABNORMAL HIGH (ref 0.7–4.0)
MCH: 30.5 pg (ref 26.0–34.0)
MCHC: 29.7 g/dL — ABNORMAL LOW (ref 30.0–36.0)
MCV: 102.7 fL — ABNORMAL HIGH (ref 80.0–100.0)
Monocytes Absolute: 0.3 10*3/uL (ref 0.1–1.0)
Monocytes Relative: 3 %
Neutro Abs: 4.8 10*3/uL (ref 1.7–7.7)
Neutrophils Relative %: 47 %
Platelets: 84 10*3/uL — ABNORMAL LOW (ref 150–400)
RBC: 2.62 MIL/uL — ABNORMAL LOW (ref 3.87–5.11)
RDW: 16 % — ABNORMAL HIGH (ref 11.5–15.5)
WBC: 10.2 10*3/uL (ref 4.0–10.5)
nRBC: 0.3 % — ABNORMAL HIGH (ref 0.0–0.2)

## 2022-03-15 LAB — TROPONIN I (HIGH SENSITIVITY): Troponin I (High Sensitivity): 595 ng/L (ref <18)

## 2022-03-15 LAB — PROTIME-INR
INR: 1.8 — ABNORMAL HIGH (ref 0.8–1.2)
Prothrombin Time: 21 seconds — ABNORMAL HIGH (ref 11.4–15.2)

## 2022-03-15 LAB — COMPREHENSIVE METABOLIC PANEL
ALT: 149 U/L — ABNORMAL HIGH (ref 0–44)
AST: 174 U/L — ABNORMAL HIGH (ref 15–41)
Albumin: 2.4 g/dL — ABNORMAL LOW (ref 3.5–5.0)
Alkaline Phosphatase: 85 U/L (ref 38–126)
Anion gap: 14 (ref 5–15)
BUN: 44 mg/dL — ABNORMAL HIGH (ref 8–23)
CO2: 27 mmol/L (ref 22–32)
Calcium: 9.8 mg/dL (ref 8.9–10.3)
Chloride: 99 mmol/L (ref 98–111)
Creatinine, Ser: 6.73 mg/dL — ABNORMAL HIGH (ref 0.44–1.00)
GFR, Estimated: 6 mL/min — ABNORMAL LOW (ref >60)
Glucose, Bld: 335 mg/dL — ABNORMAL HIGH (ref 70–99)
Potassium: 3.5 mmol/L (ref 3.5–5.1)
Sodium: 140 mmol/L (ref 135–145)
Total Bilirubin: 0.5 mg/dL (ref 0.3–1.2)
Total Protein: 5.6 g/dL — ABNORMAL LOW (ref 6.5–8.1)

## 2022-03-15 LAB — CBG MONITORING, ED: Glucose-Capillary: 159 mg/dL — ABNORMAL HIGH (ref 70–99)

## 2022-03-15 LAB — APTT: aPTT: 127 seconds — ABNORMAL HIGH (ref 24–36)

## 2022-03-15 MED ORDER — NOREPINEPHRINE 4 MG/250ML-% IV SOLN
0.0000 ug/min | INTRAVENOUS | Status: DC
  Administered 2022-03-15: 10 ug/min via INTRAVENOUS

## 2022-03-15 MED ORDER — SODIUM BICARBONATE 8.4 % IV SOLN
INTRAVENOUS | Status: AC
  Filled 2022-03-15: qty 50

## 2022-03-15 MED ORDER — SODIUM BICARBONATE 8.4 % IV SOLN
INTRAVENOUS | Status: DC | PRN
  Administered 2022-03-15 (×3): 50 meq via INTRAVENOUS

## 2022-03-15 MED ORDER — ATROPINE SULFATE 1 MG/ML IV SOLN
INTRAVENOUS | Status: DC | PRN
  Administered 2022-03-15: 1 mg via INTRAVENOUS

## 2022-03-15 MED ORDER — CALCIUM CHLORIDE 10 % IV SOLN
INTRAVENOUS | Status: DC | PRN
  Administered 2022-03-15 (×2): 1 g via INTRAVENOUS

## 2022-03-15 MED ORDER — EPINEPHRINE 1 MG/10ML IJ SOSY
PREFILLED_SYRINGE | INTRAMUSCULAR | Status: DC | PRN
  Administered 2022-03-15 (×3): 1 mg via INTRAVENOUS

## 2022-03-15 MED ORDER — NOREPINEPHRINE BITARTRATE 1 MG/ML IV SOLN
INTRAVENOUS | Status: DC | PRN
  Administered 2022-03-15: 5 ug via INTRAVENOUS

## 2022-03-22 NOTE — ED Provider Notes (Addendum)
[facility] Provider Note    Event Date/Time   First MD Initiated Contact with Patient 2022/04/09 1313     (approximate)   History   CPR    HPI  Stephanie Ellis is a 71 y.o. female with ESRD on dialysis who comes in for cardiac arrest after patient was in dialysis for about 30 minutes she went unconscious.  CPR initiated by EMS with left arm IO.  Patient was given 3 epi and sodium bicarb and calcium in the field.  Got pulses back after about 20 minutes.  They reported that it was all PEA no shockable rhythms.  They stated that while transporting patient patient lost pulses again and a PEA arrest.  Upon evaluation here patient continues to not have a pulse. Concern for low hemoglobin  Physical Exam   Triage Vital Signs: ED Triage Vitals  Enc Vitals Group     BP 04/09/22 1236 97/66     Pulse Rate 2022-04-09 1236 (!) 117     Resp 04/09/2022 1258 14     Temp --      Temp src --      SpO2 --      Weight --      Height --      Head Circumference --      Peak Flow --      Pain Score --      Pain Loc --      Pain Edu? --      Excl. in Gravity? --     Most recent vital signs: Vitals:   2022/04/09 1314 2022/04/09 1318  BP: (!) 53/12 (!) 85/52  Pulse:    Resp: 16 16     General: Obtunded CV:  No pulse Resp:  Normal effort.  Abd:  Distended Other:  I-gel in place.  No spontaneous breathing.  Pupils large unreactive.  No pulse noted.  IO noted in left humerus.  Bloody secretions noted in the i-gel  Brown stool  Bilateral leg amputees   ED Results / Procedures / Treatments   Labs (all labs ordered are listed, but only abnormal results are displayed) Labs Reviewed  CBC WITH DIFFERENTIAL/PLATELET - Abnormal; Notable for the following components:      Result Value   RBC 2.62 (*)    Hemoglobin 8.0 (*)    HCT 26.9 (*)    MCV 102.7 (*)    MCHC 29.7 (*)    RDW 16.0 (*)    Platelets 84 (*)    nRBC 0.3 (*)    Lymphs Abs 4.6 (*)    Abs Immature Granulocytes  0.36 (*)    All other components within normal limits  COMPREHENSIVE METABOLIC PANEL - Abnormal; Notable for the following components:   Glucose, Bld 335 (*)    BUN 44 (*)    Creatinine, Ser 6.73 (*)    Total Protein 5.6 (*)    Albumin 2.4 (*)    AST 174 (*)    ALT 149 (*)    GFR, Estimated 6 (*)    All other components within normal limits  APTT - Abnormal; Notable for the following components:   aPTT 127 (*)    All other components within normal limits  PROTIME-INR - Abnormal; Notable for the following components:   Prothrombin Time 21.0 (*)    INR 1.8 (*)    All other components within normal limits  CBG MONITORING, ED - Abnormal; Notable for the following components:   Glucose-Capillary  159 (*)    All other components within normal limits  TROPONIN I (HIGH SENSITIVITY) - Abnormal; Notable for the following components:   Troponin I (High Sensitivity) 595 (*)    All other components within normal limits  TYPE AND SCREEN  TYPE AND SCREEN     EKG  My interpretation of EKG:  Sinus rate of 77 with occasional PVC looks like a drop beat maybe a Heart block   RADIOLOGY None  PROCEDURES:  Critical Care performed: yes  .1-3 Lead EKG Interpretation  Performed by: Vanessa [place], MD Authorized by: Vanessa [place], MD     Interpretation: abnormal     ECG rate:  40-115   ECG rate assessment: normal     Rhythm: sinus rhythm     Ectopy: PAC and PVCs     Conduction: normal   Comments:     Mostly sinus bradycardia .Critical Care  Performed by: Vanessa [place], MD Authorized by: Vanessa [place], MD   Critical care provider statement:    Critical care time (minutes):  75   Critical care was time spent personally by me on the following activities:  Development of treatment plan with patient or surrogate, discussions with consultants, evaluation of patient's response to treatment, examination of patient, ordering and review of laboratory studies, ordering and review of radiographic  studies, ordering and performing treatments and interventions, pulse oximetry, re-evaluation of patient's condition and review of old charts Procedure Name: Intubation Date/Time: 03/16/22 1:43 PM  Performed by: Vanessa [place], MDPre-anesthesia Checklist: Patient identified, Patient being monitored, Emergency Drugs available, Timeout performed and Suction available Oxygen Delivery Method: Non-rebreather mask Preoxygenation: Pre-oxygenation with 100% oxygen Induction Type: Rapid sequence Ventilation: Mask ventilation without difficulty Laryngoscope Size: Glidescope Tube size: 7.5 mm Number of attempts: 1 Placement Confirmation: ETT inserted through vocal cords under direct vision, CO2 detector and Breath sounds checked- equal and bilateral Tube secured with: Tape Difficulty Due To: Difficulty was unanticipated Future Recommendations: Recommend- induction with short-acting agent, and alternative techniques readily available Comments: After removal of Igel broken tooth was noted and removed from mouth prior to insertion of blade or ET tube.        MEDICATIONS ORDERED IN ED: Medications  EPINEPHrine (ADRENALIN) 1 MG/10ML injection (1 mg Intravenous Given 16-Mar-2022 1253)  sodium bicarbonate injection (50 mEq Intravenous Given 03/16/2022 1257)  calcium chloride injection (1 g Intravenous Given Mar 16, 2022 1245)  norepinephrine (LEVOPHED) injection (5 mcg Intravenous Given 03/16/22 1240)  norepinephrine (LEVOPHED) 36m in 253m(0.016 mg/mL) premix infusion (0 mcg/min Intravenous Stopped 1125-Nov-2023329)  atropine injection (1 mg Intravenous Given 1111/25/2023253)  sodium bicarbonate 1 mEq/mL injection (0 mEq  Hold 112023/11/25300)     IMPRESSION / MDM / ASSESSMENT AND PLAN / ED COURSE  I reviewed the triage vital signs and the nursing notes.   Patient's presentation is most consistent with acute presentation with potential threat to life or bodily function.   Patient comes in with cardiac  arrest with 30 minutes of CPR.  Patient was given multiple doses of bicarb, calcium EKG without evidence of STEMI does have some PACs potentially block present I do see 1 dropped beat but blood pressures have come up and HR in the 70-80s  and patient was on Levophed and had pulses but then bradycardia back down attempted to give atropine consider pacing but patient lost pulses again.  Patient was going in and now of having pulses and not having pulses for brief periods of time  even on max Levophed.  When patient would bradycardia down into the 40s on the monitor appeared sinus at that time. Attempted another EKG but unable to capture before losing pulses again.  Bedside echocardiogram did not see any large effusion causing tamponade.  Considered heart attack and TXA but given prolonged downtime and lots of blood noted from the i-gel tube did not feel like this would be indicated.  Patient is also on Eliquis unclear if any head trauma.  There was concerns for potential GI bleed given low hemoglobin that patient has been dealing with recently but stool was noted to be brown.  We did give some fluids to help if there was due to hypovolemia.  Glucose was normal.  After patient was coded for over an hour off and on +30 minutes in the field patient began to bradycardia down again sinus and no pulse was felt.   Family was at bedside, the daughter and the POA the other daughter was also called.  We have held off on further compressions due to poor long-term outcomes given prolonged downtime and coding over 1.5 hours. Time of death called 1328.  Patient had asystole on the monitor.  No respiratory rate.  Unresponsive.  I have attempted to call the medical examiner multiple times but nobody is picking up.  I discussed with family and they agree they do not need autopsy and given her comorbidities suspect will not be a medical examiner case.  Will alert patient's PCP and the secretary will attempt to continue to notify medical  examiner either today or tomorrow.   Lisa from medical exam - does not sound like ME case. Will have secretary reach out to [facility] and get NP or PCP there to sign death certificate.      FINAL CLINICAL IMPRESSION(S) / ED DIAGNOSES   Final diagnoses:  Cardiac arrest [facility])     Rx / DC Orders   ED Discharge Orders     None        Note:  This document was prepared using Dragon voice recognition software and may include unintentional dictation errors.   Vanessa [place], MD 2022/03/21 1512    Vanessa Domino, MD 03/21/2022 [phone]

## 2022-03-22 NOTE — Code Documentation (Signed)
CPR resumed

## 2022-03-22 NOTE — Code Documentation (Signed)
CPR paused- PEA, CPR resumed

## 2022-03-22 NOTE — ED Notes (Signed)
Family at bedside with Jari Pigg, MD

## 2022-03-22 NOTE — ED Triage Notes (Signed)
Patient to ED via [facility] from dialysis. Patient went unconscious approx 30 min into treatment. CPR initiated- given 3 epi and sodium bicarb. IO left arm

## 2022-03-22 NOTE — ED Notes (Signed)
No pulse, no cardiac activity. Time of death called by Jari Pigg, [place].

## 2022-03-22 NOTE — Progress Notes (Signed)
   2022/04/13 1300  Clinical Encounter Type  Visited With Patient and family together  Visit Type Initial  Referral From Nurse  Consult/Referral To Chaplain   Chaplain responded to CPR in progress and resulting death. Chaplain provided compassionate presence and reflective listening as family spoke about events. Family is waiting for another family member. Chaplain services are available for follow up as needed.

## 2022-03-22 NOTE — Code Documentation (Signed)
CPR paused, pulse found at this time

## 2022-03-22 DEATH — deceased
